# Patient Record
Sex: Female | Born: 1965
Health system: Southern US, Community
[De-identification: ages and names within clinical notes are randomized; demographics above are authoritative.]

## PROBLEM LIST (undated history)

## (undated) DIAGNOSIS — D6862 Lupus anticoagulant syndrome: Secondary | ICD-10-CM

## (undated) DIAGNOSIS — I5021 Acute systolic (congestive) heart failure: Secondary | ICD-10-CM

## (undated) DIAGNOSIS — I2510 Atherosclerotic heart disease of native coronary artery without angina pectoris: Secondary | ICD-10-CM

## (undated) DIAGNOSIS — J9610 Chronic respiratory failure, unspecified whether with hypoxia or hypercapnia: Secondary | ICD-10-CM

## (undated) DIAGNOSIS — I10 Essential (primary) hypertension: Secondary | ICD-10-CM

## (undated) DIAGNOSIS — R1011 Right upper quadrant pain: Secondary | ICD-10-CM

## (undated) DIAGNOSIS — Z930 Tracheostomy status: Secondary | ICD-10-CM

## (undated) DIAGNOSIS — E785 Hyperlipidemia, unspecified: Secondary | ICD-10-CM

## (undated) DIAGNOSIS — L409 Psoriasis, unspecified: Secondary | ICD-10-CM

## (undated) DIAGNOSIS — I2111 ST elevation (STEMI) myocardial infarction involving right coronary artery: Secondary | ICD-10-CM

## (undated) DIAGNOSIS — G8929 Other chronic pain: Secondary | ICD-10-CM

## (undated) DIAGNOSIS — Z8719 Personal history of other diseases of the digestive system: Secondary | ICD-10-CM

## (undated) DIAGNOSIS — C539 Malignant neoplasm of cervix uteri, unspecified: Secondary | ICD-10-CM

## (undated) DIAGNOSIS — E119 Type 2 diabetes mellitus without complications: Secondary | ICD-10-CM

## (undated) DIAGNOSIS — I82409 Acute embolism and thrombosis of unspecified deep veins of unspecified lower extremity: Secondary | ICD-10-CM

## (undated) DIAGNOSIS — J449 Chronic obstructive pulmonary disease, unspecified: Secondary | ICD-10-CM

## (undated) DIAGNOSIS — N809 Endometriosis, unspecified: Secondary | ICD-10-CM

## (undated) DIAGNOSIS — I469 Cardiac arrest, cause unspecified: Secondary | ICD-10-CM

## (undated) DIAGNOSIS — N170 Acute kidney failure with tubular necrosis: Secondary | ICD-10-CM

## (undated) DIAGNOSIS — I5032 Chronic diastolic (congestive) heart failure: Secondary | ICD-10-CM

## (undated) DIAGNOSIS — Z9289 Personal history of other medical treatment: Secondary | ICD-10-CM

## (undated) DIAGNOSIS — S92911A Unspecified fracture of right toe(s), initial encounter for closed fracture: Secondary | ICD-10-CM

## (undated) HISTORY — DX: Chronic diastolic (congestive) heart failure: I50.32

## (undated) HISTORY — DX: Acute embolism and thrombosis of unspecified deep veins of unspecified lower extremity: I82.409

## (undated) HISTORY — PX: CAROTID STENT: SHX1301

## (undated) HISTORY — DX: Morbid (severe) obesity due to excess calories: E66.01

## (undated) HISTORY — PX: TRACHEOSTOMY: SUR1362

## (undated) HISTORY — DX: Chronic respiratory failure, unspecified whether with hypoxia or hypercapnia: J96.10

---

## 2005-04-23 ENCOUNTER — Encounter: Payer: Self-pay | Admitting: Cardiology

## 2005-09-06 HISTORY — PX: CORONARY ARTERY BYPASS GRAFT: SHX141

## 2007-01-17 ENCOUNTER — Emergency Department (HOSPITAL_COMMUNITY): Admission: EM | Admit: 2007-01-17 | Discharge: 2007-01-17 | Payer: Self-pay | Admitting: Emergency Medicine

## 2007-01-17 ENCOUNTER — Encounter: Payer: Self-pay | Admitting: Cardiology

## 2007-02-27 ENCOUNTER — Encounter: Payer: Self-pay | Admitting: Cardiology

## 2007-02-27 ENCOUNTER — Emergency Department (HOSPITAL_COMMUNITY): Admission: EM | Admit: 2007-02-27 | Discharge: 2007-02-27 | Payer: Self-pay | Admitting: Emergency Medicine

## 2007-09-25 ENCOUNTER — Ambulatory Visit: Payer: Self-pay | Admitting: Cardiology

## 2007-09-25 ENCOUNTER — Encounter: Payer: Self-pay | Admitting: Cardiology

## 2007-10-03 ENCOUNTER — Ambulatory Visit: Payer: Self-pay | Admitting: Cardiology

## 2007-11-07 ENCOUNTER — Ambulatory Visit: Payer: Self-pay | Admitting: Cardiology

## 2009-05-12 ENCOUNTER — Encounter: Payer: Self-pay | Admitting: Physician Assistant

## 2009-05-13 ENCOUNTER — Ambulatory Visit: Payer: Self-pay | Admitting: Cardiology

## 2009-05-13 ENCOUNTER — Encounter: Payer: Self-pay | Admitting: Physician Assistant

## 2009-05-14 ENCOUNTER — Encounter: Payer: Self-pay | Admitting: Physician Assistant

## 2009-05-16 ENCOUNTER — Encounter: Payer: Self-pay | Admitting: Cardiology

## 2009-05-27 DIAGNOSIS — I2510 Atherosclerotic heart disease of native coronary artery without angina pectoris: Secondary | ICD-10-CM | POA: Insufficient documentation

## 2009-05-27 DIAGNOSIS — I10 Essential (primary) hypertension: Secondary | ICD-10-CM | POA: Insufficient documentation

## 2009-06-11 ENCOUNTER — Encounter: Payer: Self-pay | Admitting: Cardiology

## 2009-06-11 DIAGNOSIS — I82409 Acute embolism and thrombosis of unspecified deep veins of unspecified lower extremity: Secondary | ICD-10-CM | POA: Insufficient documentation

## 2009-06-11 DIAGNOSIS — Z951 Presence of aortocoronary bypass graft: Secondary | ICD-10-CM

## 2009-06-11 DIAGNOSIS — E119 Type 2 diabetes mellitus without complications: Secondary | ICD-10-CM

## 2009-06-11 DIAGNOSIS — E785 Hyperlipidemia, unspecified: Secondary | ICD-10-CM

## 2009-06-13 ENCOUNTER — Ambulatory Visit: Payer: Self-pay | Admitting: Cardiology

## 2009-06-24 ENCOUNTER — Encounter: Payer: Self-pay | Admitting: Cardiology

## 2009-10-31 ENCOUNTER — Emergency Department (HOSPITAL_COMMUNITY): Admission: EM | Admit: 2009-10-31 | Discharge: 2009-10-31 | Payer: Self-pay | Admitting: Emergency Medicine

## 2010-06-17 ENCOUNTER — Emergency Department (HOSPITAL_COMMUNITY): Admission: EM | Admit: 2010-06-17 | Discharge: 2010-06-17 | Payer: Self-pay | Admitting: Emergency Medicine

## 2011-01-19 NOTE — Assessment & Plan Note (Signed)
[place] OFFICE NOTE   Theresa [name], Theresa [name]                      MRN:          [id number]  DATE:11/07/2007                            DOB:          April 26, 1966    Theresa [name] is seen for follow-up.  I had seen her as a new patient for  consultation on September 25, 2007.  She has a very complicated medical  history.  We did proceed with an echo to reassess LV function.  Fortunately her ejection fraction appears to be in the 50-55% range.  The valves were difficult to evaluate but there were no significant  abnormalities documented.  She is feeling well.  I am seeing her back in  follow-up.  I do not have any other additional records from her prior  care at other institutions.   Her cardiac status has been stable.   PAST MEDICAL HISTORY:   ALLERGIES:  PENICILLIN.   MEDICATIONS:  Pravastatin 40, aspirin 81, Plavix 75, warfarin as  directed, metformin 500 b.i.d., fluoxetine 20, diazepam 5 at night,  furosemide 40.  She also uses either hydrochlorothiazide or metolazone  on a p.r.n. basis if she becomes volume overloaded and she is stable  with this regimen.   OTHER MEDICAL PROBLEMS:  See the list below.   REVIEW OF SYSTEMS:  She is feeling well.  She does not have any  significant complaints.  She does not check her blood pressure at home.  Her review of systems is negative.   PHYSICAL EXAM:  Weight is 254 pounds.  This is down 2 pounds by our  scale since her last visit.  Blood pressure is 151/97.  The patient is oriented to person, time and place.  Affect is normal.  She is significantly overweight.  HEENT:  Reveals no xanthelasma.  She has normal extraocular motion.  There are no carotid bruits.  There is no jugular venous distention.  She has her long-term permanent trache in place.  She speaks well when  her trache site is covered.  Lungs are clear.  Respiratory effort is not labored.  Cardiac exam  reveals S1-S2.  There are no clicks or significant murmurs.  The abdomen reveals normal bowel sounds.  She has no significant peripheral edema.   No other labs were done today.  See the echo report as mentioned above.   PROBLEMS:  1. History of elevated cholesterol treated.  2. His history of some type of aneurysm of her right groin treated by      stenting in the past by history.  3. History of tubal ligation.  4. Next history of cervical cancer treated with laser.  5. Status post C-section.  6. Next status post respiratory failure in 2002 of unknown etiology      with lung biopsies and a permanent tracheostomy.  7. Allergy to PENICILLIN.  8. Next history of lupus anticoagulant and DVT.  She is on Coumadin      for this.  This is why she is on aspirin, Plavix and Coumadin.  At      some point  we may back off on some of these medicines, but until we      have more information I am hesitant to do that at this time.  9. Next coronary disease.  She had stents and CABG, and CABG x2 in      2007.  We have no records yet.  10.Next hypertension.  Her blood pressure is higher than I would like.      Specifically with diabetes this must be treated.  An ACE inhibitor      would be optimal.  She tells me that she has used lisinopril in the      past.  Low-dose lisinopril 10 mg will be started today and will see      her in follow-up.  11.Next Coumadin therapy along with aspirin and Plavix.  See the      discussion above.  These are to be continued at this time.  12.Next diabetes.  13.Next ejection fraction 50-55% by echo on October 03, 2007.   Lisinopril 10 mg will be started.  I will see her back in approximately  3 months.     Carlena Bjornstad, MD, [facility]  Electronically Signed    JDK/MedQ  DD: 11/07/2007  DT: 11/07/2007  Job #: [id number]   cc:   Monico Blitz, MD

## 2011-01-19 NOTE — Assessment & Plan Note (Signed)
[place] OFFICE NOTE   KANDA, ELRICK                      MRN:          [id number]  DATE:09/25/2007                            DOB:          10-30-1965    Ms. Theresa [name] has significant cardiac disease.  She has a significant  history and is stable at this time.  She is seen for new evaluation to  reassess all of her cardiac problems.  Historically, she has had  definite coronary disease with interventions and surgery.  Her angina is  a heaviness in her chest.  She is not having that at this time.  She is  not having any syncope or presyncope.   PAST MEDICAL HISTORY:  Quite complicated.  In 2002 she had respiratory  failure of unknown etiology and she was comatose for several weeks.  She  had bilateral lung biopsies by history and was told she had an unknown  etiology.  She required a tracheostomy.  When they tried to remove it,  she was not able to breathe, and she has a chronic tracheostomy.  Then  in approximately 2007, she had multiple stents done in [place]  followed by surgery in [place] with CABG in 2007.  She has also had a  stent placed in her right leg, possibly in the femoral area.  There is a  history of deep venous thrombosis.  She has lupus anticoagulant  abnormality and she is on chronic Coumadin.  Despite all of these  issues, she is stable.   ALLERGIES:  PENICILLIN.   MEDICATIONS:  1. Pravastatin 40.  2. Aspirin 81.  3. Plavix 75.  4. Coumadin followed by Dr. Manuella Ghazi.  5. Metformin.  6. Fluoxetine.  7. Diazepam.  8. Furosemide 40.  9. Hydrochlorothiazide 25.   OTHER MEDICAL PROBLEMS:  See the list below.   SOCIAL HISTORY:  She is married and here with her husband today.   FAMILY HISTORY:  Family history is noncontributory.   The patient unfortunately continues to smoke one half-pack per day.  She  hopes to begin to try to stop again.  She has stopped in the past.   REVIEW OF SYSTEMS:  At this point she has no major complaints.  She does  have some pain in her calf muscles intermittently.  She has some  shortness of breath.  She has mild intermittent swelling in her ankles.  She does have a some headaches.  Otherwise her review of systems is  negative.   PHYSICAL EXAM:  Blood pressure today is 140/90 with a pulse of 83.  Weight is 257 pounds and height 4 feet 10 inches.  The patient is oriented to person, time and place.  Affect is normal.  She is here with her husband today.  HEENT:  Reveals no xanthelasma.  She has normal extraocular motion.  There are no carotid bruits.  There is no jugular venous distention.  Her long-term permanent trache is in place.  She speaks well when  covering her trache.  Lungs are clear.  Respiratory effort is not labored.  Cardiac exam  reveals S1-S2.  There are no clicks or significant murmurs.  Abdomen is obese but soft.  She has trace peripheral edema.  She is markedly overweight.   EKG reveals nonspecific ST-T wave changes.  She has a normal sinus  rhythm.  No labs are available at this time.   PROBLEMS:  1. History of elevated cholesterol treated.  2. History of some type of aneurysm in her right groin treated by      stenting by history.  3. History of tubal ligation.  4. History of cervical cancer treated with laser.  5. Status post C-section.  6. Status post respiratory failure in 2002 of unknown etiology with      lung biopsies and a permanent tracheostomy.  7. Allergy to PENICILLIN.  8. History of lupus anticoagulant and DVT.  She is on Coumadin for      this.  9. Coronary artery disease.  The patient has had stents and CABG.  Her      CABG was x2 in 2007 and we do not have the specific records and      hopefully we can obtain some of this data over time.  10.Hypertension on medications.  11.Coumadin therapy along with Plavix and aspirin.  I am not inclined      to stop any of these at this time.    The patient is stable today.  We do not know her LV function.  She needs  a 2-D echo.  This will be done and I will see her back in 4-5 weeks.  No  changes in her medicines at this time.     Carlena Bjornstad, MD, [facility]  Electronically Signed    JDK/MedQ  DD: 09/25/2007  DT: 09/25/2007  Job #: [id number]   cc:   Monico Blitz, MD

## 2011-01-31 DIAGNOSIS — R079 Chest pain, unspecified: Secondary | ICD-10-CM

## 2011-06-23 LAB — DIFFERENTIAL
Eosinophils Absolute: 0.1
Lymphocytes Relative: 34
Lymphs Abs: 2.4
Neutrophils Relative %: 61

## 2011-06-23 LAB — PROTIME-INR
INR: 1.7 — ABNORMAL HIGH
Prothrombin Time: 21.1 — ABNORMAL HIGH

## 2011-06-23 LAB — COMPREHENSIVE METABOLIC PANEL
Alkaline Phosphatase: 121 — ABNORMAL HIGH
BUN: 7
Chloride: 107
GFR calc non Af Amer: >60
Glucose, Bld: 126 — ABNORMAL HIGH
Potassium: 3.9
Total Bilirubin: 0.8

## 2011-06-23 LAB — CBC
Platelets: 297
WBC: 7.2

## 2011-06-23 LAB — LIPASE, BLOOD: Lipase: 22

## 2011-08-15 ENCOUNTER — Emergency Department (HOSPITAL_COMMUNITY): Payer: Medicare Other

## 2011-08-15 ENCOUNTER — Emergency Department (HOSPITAL_COMMUNITY)
Admission: EM | Admit: 2011-08-15 | Discharge: 2011-08-15 | Disposition: A | Discharge status: Home / Self Care | Payer: Medicare Other | Attending: Emergency Medicine | Admitting: Emergency Medicine

## 2011-08-15 DIAGNOSIS — F17200 Nicotine dependence, unspecified, uncomplicated: Secondary | ICD-10-CM | POA: Insufficient documentation

## 2011-08-15 DIAGNOSIS — Y92410 Unspecified street and highway as the place of occurrence of the external cause: Secondary | ICD-10-CM | POA: Insufficient documentation

## 2011-08-15 DIAGNOSIS — M25579 Pain in unspecified ankle and joints of unspecified foot: Secondary | ICD-10-CM | POA: Insufficient documentation

## 2011-08-15 DIAGNOSIS — D6859 Other primary thrombophilia: Secondary | ICD-10-CM | POA: Insufficient documentation

## 2011-08-15 DIAGNOSIS — IMO0002 Reserved for concepts with insufficient information to code with codable children: Secondary | ICD-10-CM | POA: Insufficient documentation

## 2011-08-15 DIAGNOSIS — S93409A Sprain of unspecified ligament of unspecified ankle, initial encounter: Secondary | ICD-10-CM | POA: Insufficient documentation

## 2011-08-15 DIAGNOSIS — I252 Old myocardial infarction: Secondary | ICD-10-CM | POA: Insufficient documentation

## 2011-08-15 HISTORY — DX: Lupus anticoagulant syndrome: D68.62

## 2011-08-15 MED ORDER — OXYCODONE-ACETAMINOPHEN 5-325 MG PO TABS
2.0000 | ORAL_TABLET | Freq: Once | ORAL | Status: AC
  Administered 2011-08-15: 2 via ORAL
  Filled 2011-08-15: qty 2

## 2011-08-15 MED ORDER — OXYCODONE-ACETAMINOPHEN 5-325 MG PO TABS: 1.0000 | ORAL_TABLET | ORAL | Status: AC | PRN
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
## 2011-08-16 NOTE — ED Provider Notes (Signed)
Medical screening examination/treatment/procedure(s) were performed by non-physician practitioner and as supervising physician I was immediately available for consultation/collaboration.   Alfonzo Feller, DO 08/16/11 [phone]

## 2015-05-20 ENCOUNTER — Emergency Department (HOSPITAL_COMMUNITY): Payer: Medicare Other

## 2015-05-20 ENCOUNTER — Emergency Department (HOSPITAL_COMMUNITY)
Admission: EM | Admit: 2015-05-20 | Discharge: 2015-05-20 | Disposition: A | Discharge status: Home / Self Care | Payer: Medicare Other | Attending: Emergency Medicine | Admitting: Emergency Medicine

## 2015-05-20 ENCOUNTER — Encounter (HOSPITAL_COMMUNITY): Payer: Self-pay

## 2015-05-20 DIAGNOSIS — Z8709 Personal history of other diseases of the respiratory system: Secondary | ICD-10-CM | POA: Insufficient documentation

## 2015-05-20 DIAGNOSIS — Y9389 Activity, other specified: Secondary | ICD-10-CM | POA: Insufficient documentation

## 2015-05-20 DIAGNOSIS — S8991XA Unspecified injury of right lower leg, initial encounter: Secondary | ICD-10-CM | POA: Insufficient documentation

## 2015-05-20 DIAGNOSIS — Y92410 Unspecified street and highway as the place of occurrence of the external cause: Secondary | ICD-10-CM | POA: Insufficient documentation

## 2015-05-20 DIAGNOSIS — Z79899 Other long term (current) drug therapy: Secondary | ICD-10-CM | POA: Insufficient documentation

## 2015-05-20 DIAGNOSIS — Y998 Other external cause status: Secondary | ICD-10-CM | POA: Insufficient documentation

## 2015-05-20 DIAGNOSIS — Z880 Allergy status to penicillin: Secondary | ICD-10-CM | POA: Insufficient documentation

## 2015-05-20 DIAGNOSIS — T1490XA Injury, unspecified, initial encounter: Secondary | ICD-10-CM

## 2015-05-20 DIAGNOSIS — Z862 Personal history of diseases of the blood and blood-forming organs and certain disorders involving the immune mechanism: Secondary | ICD-10-CM | POA: Insufficient documentation

## 2015-05-20 DIAGNOSIS — S42342A Displaced spiral fracture of shaft of humerus, left arm, initial encounter for closed fracture: Secondary | ICD-10-CM | POA: Insufficient documentation

## 2015-05-20 DIAGNOSIS — S42302A Unspecified fracture of shaft of humerus, left arm, initial encounter for closed fracture: Secondary | ICD-10-CM

## 2015-05-20 DIAGNOSIS — Z930 Tracheostomy status: Secondary | ICD-10-CM | POA: Insufficient documentation

## 2015-05-20 DIAGNOSIS — Z720 Tobacco use: Secondary | ICD-10-CM | POA: Insufficient documentation

## 2015-05-20 DIAGNOSIS — S0003XA Contusion of scalp, initial encounter: Secondary | ICD-10-CM | POA: Insufficient documentation

## 2015-05-20 DIAGNOSIS — Z7901 Long term (current) use of anticoagulants: Secondary | ICD-10-CM | POA: Insufficient documentation

## 2015-05-20 DIAGNOSIS — I252 Old myocardial infarction: Secondary | ICD-10-CM | POA: Insufficient documentation

## 2015-05-20 LAB — COMPREHENSIVE METABOLIC PANEL
ALT: 18 U/L (ref 14–54)
AST: 21 U/L (ref 15–41)
Albumin: 3.5 g/dL (ref 3.5–5.0)
Alkaline Phosphatase: 93 U/L (ref 38–126)
Anion gap: 13 (ref 5–15)
BUN: 9 mg/dL (ref 6–20)
CHLORIDE: 105 mmol/L (ref 101–111)
CO2: 19 mmol/L — ABNORMAL LOW (ref 22–32)
Calcium: 8.9 mg/dL (ref 8.9–10.3)
Creatinine, Ser: 0.78 mg/dL (ref 0.44–1.00)
Glucose, Bld: 160 mg/dL — ABNORMAL HIGH (ref 65–99)
POTASSIUM: 4.2 mmol/L (ref 3.5–5.1)
Sodium: 137 mmol/L (ref 135–145)
Total Bilirubin: 1.2 mg/dL (ref 0.3–1.2)
Total Protein: 7 g/dL (ref 6.5–8.1)

## 2015-05-20 LAB — PROTIME-INR
INR: 2.23 — AB (ref 0.00–1.49)
Prothrombin Time: 24.5 seconds — ABNORMAL HIGH (ref 11.6–15.2)

## 2015-05-20 LAB — CBC
HEMATOCRIT: 49.7 % — AB (ref 36.0–46.0)
HEMOGLOBIN: 17.4 g/dL — AB (ref 12.0–15.0)
MCH: 33.3 pg (ref 26.0–34.0)
MCHC: 35 g/dL (ref 30.0–36.0)
MCV: 95 fL (ref 78.0–100.0)
Platelets: 209 10*3/uL (ref 150–400)
RBC: 5.23 MIL/uL — AB (ref 3.87–5.11)
RDW: 13.2 % (ref 11.5–15.5)
WBC: 11.9 10*3/uL — AB (ref 4.0–10.5)

## 2015-05-20 LAB — ETHANOL: Alcohol, Ethyl (B): <5 mg/dL (ref <5)

## 2015-05-20 LAB — SAMPLE TO BLOOD BANK

## 2015-05-20 LAB — CDS SEROLOGY

## 2015-05-20 MED ORDER — OXYCODONE HCL 5 MG PO TABS: 5.0000 mg | ORAL_TABLET | Freq: Four times a day (QID) | ORAL | Status: DC | PRN

## 2015-05-20 MED ORDER — HYDROMORPHONE HCL 1 MG/ML IJ SOLN
1.0000 mg | Freq: Once | INTRAMUSCULAR | Status: AC
  Administered 2015-05-20: 1 mg via INTRAVENOUS
  Filled 2015-05-20: qty 1

## 2015-05-20 MED ORDER — ONDANSETRON 4 MG PO TBDP
8.0000 mg | ORAL_TABLET | Freq: Once | ORAL | Status: AC
  Administered 2015-05-20: 8 mg via ORAL
  Filled 2015-05-20: qty 2

## 2015-05-20 MED ORDER — ONDANSETRON HCL 4 MG/2ML IJ SOLN
4.0000 mg | Freq: Once | INTRAMUSCULAR | Status: AC
  Administered 2015-05-20: 4 mg via INTRAVENOUS
  Filled 2015-05-20: qty 2

## 2015-05-20 MED ORDER — ONDANSETRON 4 MG PO TBDP: 4.0000 mg | ORAL_TABLET | Freq: Three times a day (TID) | ORAL | Status: DC | PRN

## 2015-05-20 NOTE — ED Notes (Signed)
Paged ortho tech of splint needed.  Patient remains in radiology at this time.
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
## 2015-05-20 NOTE — ED Notes (Signed)
Patient to CT and xray.
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
## 2015-05-20 NOTE — ED Notes (Signed)
Patient returned from radiology
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
## 2015-05-20 NOTE — Progress Notes (Signed)
Orthopedic Tech Progress Note Patient Details:  Theresa [name] 27-Nov-1965 [id number]  Ortho Devices Type of Ortho Device: Ace wrap, Coapt Ortho Device/Splint Location: LUE Ortho Device/Splint Interventions: Ordered, Application   Braulio Bosch 05/20/2015, 9:36 PM

## 2015-06-16 ENCOUNTER — Encounter: Payer: Self-pay | Admitting: Physician Assistant

## 2015-10-06 DIAGNOSIS — I80209 Phlebitis and thrombophlebitis of unspecified deep vessels of unspecified lower extremity: Secondary | ICD-10-CM | POA: Diagnosis not present

## 2015-10-07 DIAGNOSIS — S42342D Displaced spiral fracture of shaft of humerus, left arm, subsequent encounter for fracture with routine healing: Secondary | ICD-10-CM | POA: Diagnosis not present

## 2015-10-15 DIAGNOSIS — R05 Cough: Secondary | ICD-10-CM | POA: Diagnosis not present

## 2015-10-15 DIAGNOSIS — F17210 Nicotine dependence, cigarettes, uncomplicated: Secondary | ICD-10-CM | POA: Diagnosis not present

## 2015-10-15 DIAGNOSIS — J449 Chronic obstructive pulmonary disease, unspecified: Secondary | ICD-10-CM | POA: Diagnosis not present

## 2015-10-15 DIAGNOSIS — E1142 Type 2 diabetes mellitus with diabetic polyneuropathy: Secondary | ICD-10-CM | POA: Diagnosis not present

## 2015-10-15 DIAGNOSIS — Z720 Tobacco use: Secondary | ICD-10-CM | POA: Diagnosis not present

## 2015-10-15 DIAGNOSIS — Z930 Tracheostomy status: Secondary | ICD-10-CM | POA: Diagnosis not present

## 2015-10-15 DIAGNOSIS — I517 Cardiomegaly: Secondary | ICD-10-CM | POA: Diagnosis not present

## 2015-10-15 DIAGNOSIS — Z6841 Body Mass Index (BMI) 40.0 and over, adult: Secondary | ICD-10-CM | POA: Diagnosis not present

## 2015-10-29 ENCOUNTER — Encounter (HOSPITAL_COMMUNITY)
Admission: EM | Disposition: A | Discharge status: Home Health | Payer: Self-pay | Source: Home / Self Care | Attending: Cardiovascular Disease

## 2015-10-29 ENCOUNTER — Inpatient Hospital Stay (HOSPITAL_COMMUNITY)
Admission: EM | Admit: 2015-10-29 | Discharge: 2015-11-03 | DRG: 246 | Disposition: A | Discharge status: Home Health | Payer: Medicare Other | Attending: Cardiovascular Disease | Admitting: Cardiovascular Disease

## 2015-10-29 ENCOUNTER — Encounter (HOSPITAL_COMMUNITY): Payer: Self-pay | Admitting: Cardiovascular Disease

## 2015-10-29 DIAGNOSIS — I213 ST elevation (STEMI) myocardial infarction of unspecified site: Secondary | ICD-10-CM | POA: Diagnosis not present

## 2015-10-29 DIAGNOSIS — R0682 Tachypnea, not elsewhere classified: Secondary | ICD-10-CM | POA: Diagnosis not present

## 2015-10-29 DIAGNOSIS — Z955 Presence of coronary angioplasty implant and graft: Secondary | ICD-10-CM

## 2015-10-29 DIAGNOSIS — Z6841 Body Mass Index (BMI) 40.0 and over, adult: Secondary | ICD-10-CM

## 2015-10-29 DIAGNOSIS — R0989 Other specified symptoms and signs involving the circulatory and respiratory systems: Secondary | ICD-10-CM | POA: Diagnosis not present

## 2015-10-29 DIAGNOSIS — F17210 Nicotine dependence, cigarettes, uncomplicated: Secondary | ICD-10-CM | POA: Diagnosis present

## 2015-10-29 DIAGNOSIS — I5032 Chronic diastolic (congestive) heart failure: Secondary | ICD-10-CM | POA: Diagnosis present

## 2015-10-29 DIAGNOSIS — Z8541 Personal history of malignant neoplasm of cervix uteri: Secondary | ICD-10-CM

## 2015-10-29 DIAGNOSIS — I2582 Chronic total occlusion of coronary artery: Secondary | ICD-10-CM | POA: Diagnosis present

## 2015-10-29 DIAGNOSIS — R0602 Shortness of breath: Secondary | ICD-10-CM | POA: Diagnosis not present

## 2015-10-29 DIAGNOSIS — Z79899 Other long term (current) drug therapy: Secondary | ICD-10-CM | POA: Diagnosis not present

## 2015-10-29 DIAGNOSIS — Z951 Presence of aortocoronary bypass graft: Secondary | ICD-10-CM | POA: Diagnosis not present

## 2015-10-29 DIAGNOSIS — D751 Secondary polycythemia: Secondary | ICD-10-CM | POA: Diagnosis present

## 2015-10-29 DIAGNOSIS — Z930 Tracheostomy status: Secondary | ICD-10-CM

## 2015-10-29 DIAGNOSIS — E785 Hyperlipidemia, unspecified: Secondary | ICD-10-CM | POA: Diagnosis present

## 2015-10-29 DIAGNOSIS — I2119 ST elevation (STEMI) myocardial infarction involving other coronary artery of inferior wall: Secondary | ICD-10-CM | POA: Diagnosis not present

## 2015-10-29 DIAGNOSIS — E119 Type 2 diabetes mellitus without complications: Secondary | ICD-10-CM | POA: Diagnosis not present

## 2015-10-29 DIAGNOSIS — Z7984 Long term (current) use of oral hypoglycemic drugs: Secondary | ICD-10-CM

## 2015-10-29 DIAGNOSIS — Z8249 Family history of ischemic heart disease and other diseases of the circulatory system: Secondary | ICD-10-CM

## 2015-10-29 DIAGNOSIS — J9621 Acute and chronic respiratory failure with hypoxia: Secondary | ICD-10-CM | POA: Diagnosis not present

## 2015-10-29 DIAGNOSIS — Z720 Tobacco use: Secondary | ICD-10-CM | POA: Diagnosis not present

## 2015-10-29 DIAGNOSIS — I2510 Atherosclerotic heart disease of native coronary artery without angina pectoris: Secondary | ICD-10-CM | POA: Diagnosis present

## 2015-10-29 DIAGNOSIS — E1140 Type 2 diabetes mellitus with diabetic neuropathy, unspecified: Secondary | ICD-10-CM | POA: Diagnosis present

## 2015-10-29 DIAGNOSIS — E876 Hypokalemia: Secondary | ICD-10-CM

## 2015-10-29 DIAGNOSIS — I110 Hypertensive heart disease with heart failure: Secondary | ICD-10-CM | POA: Diagnosis not present

## 2015-10-29 DIAGNOSIS — I25810 Atherosclerosis of coronary artery bypass graft(s) without angina pectoris: Secondary | ICD-10-CM | POA: Diagnosis not present

## 2015-10-29 DIAGNOSIS — D6862 Lupus anticoagulant syndrome: Secondary | ICD-10-CM | POA: Diagnosis present

## 2015-10-29 DIAGNOSIS — J9601 Acute respiratory failure with hypoxia: Secondary | ICD-10-CM | POA: Diagnosis not present

## 2015-10-29 DIAGNOSIS — I2111 ST elevation (STEMI) myocardial infarction involving right coronary artery: Secondary | ICD-10-CM | POA: Diagnosis present

## 2015-10-29 DIAGNOSIS — J441 Chronic obstructive pulmonary disease with (acute) exacerbation: Secondary | ICD-10-CM | POA: Diagnosis not present

## 2015-10-29 DIAGNOSIS — Z7901 Long term (current) use of anticoagulants: Secondary | ICD-10-CM | POA: Diagnosis not present

## 2015-10-29 DIAGNOSIS — B9689 Other specified bacterial agents as the cause of diseases classified elsewhere: Secondary | ICD-10-CM | POA: Diagnosis present

## 2015-10-29 DIAGNOSIS — I25700 Atherosclerosis of coronary artery bypass graft(s), unspecified, with unstable angina pectoris: Secondary | ICD-10-CM | POA: Diagnosis not present

## 2015-10-29 DIAGNOSIS — G4733 Obstructive sleep apnea (adult) (pediatric): Secondary | ICD-10-CM | POA: Diagnosis not present

## 2015-10-29 DIAGNOSIS — I425 Other restrictive cardiomyopathy: Secondary | ICD-10-CM | POA: Diagnosis present

## 2015-10-29 DIAGNOSIS — I272 Other secondary pulmonary hypertension: Secondary | ICD-10-CM | POA: Diagnosis present

## 2015-10-29 DIAGNOSIS — T82858A Stenosis of vascular prosthetic devices, implants and grafts, initial encounter: Secondary | ICD-10-CM | POA: Diagnosis present

## 2015-10-29 DIAGNOSIS — I517 Cardiomegaly: Secondary | ICD-10-CM | POA: Diagnosis not present

## 2015-10-29 DIAGNOSIS — I252 Old myocardial infarction: Secondary | ICD-10-CM | POA: Diagnosis not present

## 2015-10-29 DIAGNOSIS — I10 Essential (primary) hypertension: Secondary | ICD-10-CM | POA: Diagnosis not present

## 2015-10-29 DIAGNOSIS — Z86718 Personal history of other venous thrombosis and embolism: Secondary | ICD-10-CM | POA: Diagnosis not present

## 2015-10-29 DIAGNOSIS — Y832 Surgical operation with anastomosis, bypass or graft as the cause of abnormal reaction of the patient, or of later complication, without mention of misadventure at the time of the procedure: Secondary | ICD-10-CM | POA: Diagnosis present

## 2015-10-29 DIAGNOSIS — I5033 Acute on chronic diastolic (congestive) heart failure: Secondary | ICD-10-CM | POA: Diagnosis not present

## 2015-10-29 DIAGNOSIS — Z880 Allergy status to penicillin: Secondary | ICD-10-CM | POA: Diagnosis not present

## 2015-10-29 DIAGNOSIS — R791 Abnormal coagulation profile: Secondary | ICD-10-CM | POA: Diagnosis not present

## 2015-10-29 DIAGNOSIS — I509 Heart failure, unspecified: Secondary | ICD-10-CM | POA: Diagnosis not present

## 2015-10-29 DIAGNOSIS — J811 Chronic pulmonary edema: Secondary | ICD-10-CM

## 2015-10-29 DIAGNOSIS — J9690 Respiratory failure, unspecified, unspecified whether with hypoxia or hypercapnia: Secondary | ICD-10-CM

## 2015-10-29 DIAGNOSIS — R079 Chest pain, unspecified: Secondary | ICD-10-CM | POA: Diagnosis not present

## 2015-10-29 DIAGNOSIS — E669 Obesity, unspecified: Secondary | ICD-10-CM | POA: Diagnosis not present

## 2015-10-29 HISTORY — PX: CARDIAC CATHETERIZATION: SHX172

## 2015-10-29 HISTORY — DX: Type 2 diabetes mellitus without complications: E11.9

## 2015-10-29 HISTORY — DX: Essential (primary) hypertension: I10

## 2015-10-29 HISTORY — DX: Tracheostomy status: Z93.0

## 2015-10-29 HISTORY — DX: Hyperlipidemia, unspecified: E78.5

## 2015-10-29 HISTORY — DX: ST elevation (STEMI) myocardial infarction involving right coronary artery: I21.11

## 2015-10-29 HISTORY — DX: Malignant neoplasm of cervix uteri, unspecified: C53.9

## 2015-10-29 HISTORY — DX: Atherosclerotic heart disease of native coronary artery without angina pectoris: I25.10

## 2015-10-29 LAB — POCT I-STAT 3, ART BLOOD GAS (G3+)
Bicarbonate: 28.5 mEq/L — ABNORMAL HIGH (ref 20.0–24.0)
O2 Saturation: 75 %
PCO2 ART: 60 mmHg — AB (ref 35.0–45.0)
PH ART: 7.285 — AB (ref 7.350–7.450)
PO2 ART: 46 mmHg — AB (ref 80.0–100.0)
TCO2: 30 mmol/L (ref 0–100)

## 2015-10-29 LAB — CBC
HCT: 54.5 % — ABNORMAL HIGH (ref 36.0–46.0)
Hemoglobin: 18 g/dL — ABNORMAL HIGH (ref 12.0–15.0)
MCH: 32 pg (ref 26.0–34.0)
MCHC: 33 g/dL (ref 30.0–36.0)
MCV: 97 fL (ref 78.0–100.0)
PLATELETS: 190 10*3/uL (ref 150–400)
RBC: 5.62 MIL/uL — AB (ref 3.87–5.11)
RDW: 14.2 % (ref 11.5–15.5)
WBC: 8.8 10*3/uL (ref 4.0–10.5)

## 2015-10-29 SURGERY — LEFT HEART CATH AND CORS/GRAFTS ANGIOGRAPHY
Anesthesia: LOCAL

## 2015-10-29 MED ORDER — BIVALIRUDIN BOLUS VIA INFUSION - CUPID
INTRAVENOUS | Status: DC | PRN
  Administered 2015-10-29: 85.05 mg via INTRAVENOUS

## 2015-10-29 MED ORDER — LIDOCAINE HCL (PF) 1 % IJ SOLN
INTRAMUSCULAR | Status: DC | PRN
Start: 2015-10-29 — End: 2015-10-29
  Administered 2015-10-29: 15 mL

## 2015-10-29 MED ORDER — INSULIN ASPART 100 UNIT/ML ~~LOC~~ SOLN
0.0000 [IU] | Freq: Three times a day (TID) | SUBCUTANEOUS | Status: DC
  Administered 2015-10-30 (×3): 5 [IU] via SUBCUTANEOUS
  Administered 2015-10-31 (×2): 8 [IU] via SUBCUTANEOUS
  Administered 2015-10-31: 3 [IU] via SUBCUTANEOUS
  Administered 2015-11-01: 2 [IU] via SUBCUTANEOUS
  Administered 2015-11-01: 8 [IU] via SUBCUTANEOUS
  Administered 2015-11-01: 3 [IU] via SUBCUTANEOUS
  Administered 2015-11-02: 5 [IU] via SUBCUTANEOUS
  Administered 2015-11-02: 8 [IU] via SUBCUTANEOUS
  Administered 2015-11-02: 5 [IU] via SUBCUTANEOUS
  Administered 2015-11-03: 3 [IU] via SUBCUTANEOUS
  Administered 2015-11-03: 5 [IU] via SUBCUTANEOUS

## 2015-10-29 MED ORDER — SODIUM CHLORIDE 0.9 % IV SOLN
INTRAVENOUS | Status: AC
  Administered 2015-10-29: 22:00:00 via INTRAVENOUS

## 2015-10-29 MED ORDER — TICAGRELOR 90 MG PO TABS
ORAL_TABLET | ORAL | Status: AC
  Filled 2015-10-29: qty 2

## 2015-10-29 MED ORDER — NITROGLYCERIN 1 MG/10 ML FOR IR/CATH LAB
INTRA_ARTERIAL | Status: AC
  Filled 2015-10-29: qty 10

## 2015-10-29 MED ORDER — SODIUM CHLORIDE 0.9% FLUSH
3.0000 mL | Freq: Two times a day (BID) | INTRAVENOUS | Status: DC
  Administered 2015-10-30 – 2015-11-03 (×8): 3 mL via INTRAVENOUS

## 2015-10-29 MED ORDER — SODIUM CHLORIDE 0.9 % IV SOLN
INTRAVENOUS | Status: DC | PRN
  Administered 2015-10-29 (×2): 10 mL/h via INTRAVENOUS

## 2015-10-29 MED ORDER — TICAGRELOR 90 MG PO TABS
90.0000 mg | ORAL_TABLET | Freq: Two times a day (BID) | ORAL | Status: DC
  Administered 2015-10-30 – 2015-11-01 (×5): 90 mg via ORAL
  Filled 2015-10-29 (×5): qty 1

## 2015-10-29 MED ORDER — SODIUM CHLORIDE 0.9% FLUSH: 3.0000 mL | INTRAVENOUS | Status: DC | PRN

## 2015-10-29 MED ORDER — MIDAZOLAM HCL 2 MG/2ML IJ SOLN
INTRAMUSCULAR | Status: DC | PRN
  Administered 2015-10-29: 1 mg via INTRAVENOUS

## 2015-10-29 MED ORDER — ATORVASTATIN CALCIUM 80 MG PO TABS
80.0000 mg | ORAL_TABLET | Freq: Every day | ORAL | Status: DC
  Administered 2015-10-29 – 2015-11-02 (×5): 80 mg via ORAL
  Filled 2015-10-29 (×5): qty 1

## 2015-10-29 MED ORDER — FUROSEMIDE 10 MG/ML IJ SOLN
40.0000 mg | Freq: Two times a day (BID) | INTRAMUSCULAR | Status: DC
  Administered 2015-10-29 – 2015-11-02 (×8): 40 mg via INTRAVENOUS
  Filled 2015-10-29 (×10): qty 4

## 2015-10-29 MED ORDER — ONDANSETRON HCL 4 MG/2ML IJ SOLN: 4.0000 mg | Freq: Four times a day (QID) | INTRAMUSCULAR | Status: DC | PRN

## 2015-10-29 MED ORDER — INSULIN ASPART 100 UNIT/ML ~~LOC~~ SOLN
10.0000 [IU] | Freq: Once | SUBCUTANEOUS | Status: AC
  Administered 2015-10-30: 10 [IU] via SUBCUTANEOUS

## 2015-10-29 MED ORDER — ADENOSINE 6 MG/2ML IV SOLN
INTRAVENOUS | Status: AC
  Filled 2015-10-29: qty 2

## 2015-10-29 MED ORDER — SODIUM CHLORIDE 0.9 % IV SOLN
250.0000 mg | INTRAVENOUS | Status: DC | PRN
  Administered 2015-10-29: 1.75 mg/kg/h via INTRAVENOUS

## 2015-10-29 MED ORDER — LISINOPRIL 20 MG PO TABS
20.0000 mg | ORAL_TABLET | Freq: Every day | ORAL | Status: DC
  Administered 2015-10-30 – 2015-11-03 (×5): 20 mg via ORAL
  Filled 2015-10-29 (×5): qty 1

## 2015-10-29 MED ORDER — HEPARIN (PORCINE) IN NACL 2-0.9 UNIT/ML-% IJ SOLN
INTRAMUSCULAR | Status: AC
  Filled 2015-10-29: qty 1000

## 2015-10-29 MED ORDER — ADENOSINE (DIAGNOSTIC) FOR INTRACORONARY USE
INTRAVENOUS | Status: DC | PRN
  Administered 2015-10-29 (×3): 36 ug via INTRACORONARY

## 2015-10-29 MED ORDER — BIVALIRUDIN 250 MG IV SOLR
INTRAVENOUS | Status: AC
  Filled 2015-10-29: qty 250

## 2015-10-29 MED ORDER — FENTANYL CITRATE (PF) 100 MCG/2ML IJ SOLN
INTRAMUSCULAR | Status: AC
  Filled 2015-10-29: qty 2

## 2015-10-29 MED ORDER — FENTANYL CITRATE (PF) 100 MCG/2ML IJ SOLN
INTRAMUSCULAR | Status: DC | PRN
  Administered 2015-10-29 (×2): 25 ug via INTRAVENOUS

## 2015-10-29 MED ORDER — MORPHINE SULFATE (PF) 2 MG/ML IV SOLN: 2.0000 mg | INTRAVENOUS | Status: DC | PRN

## 2015-10-29 MED ORDER — GABAPENTIN 100 MG PO CAPS
100.0000 mg | ORAL_CAPSULE | Freq: Every day | ORAL | Status: DC
Start: 2015-10-29 — End: 2015-11-03
  Administered 2015-10-29 – 2015-11-02 (×5): 100 mg via ORAL
  Filled 2015-10-29 (×5): qty 1

## 2015-10-29 MED ORDER — OXYCODONE-ACETAMINOPHEN 5-325 MG PO TABS: 1.0000 | ORAL_TABLET | ORAL | Status: DC | PRN

## 2015-10-29 MED ORDER — SODIUM CHLORIDE 0.9 % IV SOLN: 250.0000 mL | INTRAVENOUS | Status: DC | PRN

## 2015-10-29 MED ORDER — METOPROLOL TARTRATE 25 MG PO TABS
25.0000 mg | ORAL_TABLET | Freq: Two times a day (BID) | ORAL | Status: DC
  Administered 2015-10-29 – 2015-10-30 (×2): 25 mg via ORAL
  Filled 2015-10-29 (×2): qty 1

## 2015-10-29 MED ORDER — ACETAMINOPHEN 325 MG PO TABS: 650.0000 mg | ORAL_TABLET | ORAL | Status: DC | PRN

## 2015-10-29 MED ORDER — LIDOCAINE HCL (PF) 1 % IJ SOLN
INTRAMUSCULAR | Status: AC
  Filled 2015-10-29: qty 30

## 2015-10-29 MED ORDER — ASPIRIN 81 MG PO CHEW
81.0000 mg | CHEWABLE_TABLET | Freq: Every day | ORAL | Status: DC
  Administered 2015-10-30 – 2015-11-03 (×5): 81 mg via ORAL
  Filled 2015-10-29 (×5): qty 1

## 2015-10-29 MED ORDER — TICAGRELOR 90 MG PO TABS
ORAL_TABLET | ORAL | Status: DC | PRN
  Administered 2015-10-29: 180 mg via ORAL

## 2015-10-29 MED ORDER — MIDAZOLAM HCL 2 MG/2ML IJ SOLN
INTRAMUSCULAR | Status: AC
  Filled 2015-10-29: qty 2

## 2015-10-29 MED ORDER — NITROGLYCERIN 1 MG/10 ML FOR IR/CATH LAB
INTRA_ARTERIAL | Status: DC | PRN
  Administered 2015-10-29 (×2): 200 ug

## 2015-10-29 SURGICAL SUPPLY — 18 items
BALLN EUPHORA RX 2.0X12 (BALLOONS) ×3
BALLN ~~LOC~~ EUPHORA RX 2.75X12 (BALLOONS) ×3
BALLOON EUPHORA RX 2.0X12 (BALLOONS) IMPLANT
BALLOON ~~LOC~~ EUPHORA RX 2.75X12 (BALLOONS) IMPLANT
CATH INFINITI 5FR MULTPACK ANG (CATHETERS) ×1 IMPLANT
CATH VISTA GUIDE RCB (CATHETERS) ×1 IMPLANT
DEVICE WIRE ANGIOSEAL 6FR (Vascular Products) ×1 IMPLANT
KIT ENCORE 26 ADVANTAGE (KITS) ×1 IMPLANT
KIT HEART LEFT (KITS) ×3 IMPLANT
PACK CARDIAC CATHETERIZATION (CUSTOM PROCEDURE TRAY) ×3 IMPLANT
SHEATH PINNACLE 6F 10CM (SHEATH) ×1 IMPLANT
STENT PROMUS PREM MR 2.5X16 (Permanent Stent) ×1 IMPLANT
STOPCOCK MORSE 400PSI 3WAY (MISCELLANEOUS) ×1 IMPLANT
TRANSDUCER W/STOPCOCK (MISCELLANEOUS) ×3 IMPLANT
TUBING CIL FLEX 10 FLL-RA (TUBING) ×3 IMPLANT
WIRE COUGAR XT STRL 190CM (WIRE) ×2 IMPLANT
WIRE EMERALD 3MM-J .035X150CM (WIRE) ×1 IMPLANT
WIRE HI TORQ WHISPER MS 190CM (WIRE) ×1 IMPLANT

## 2015-10-29 NOTE — H&P (Signed)
     Patient ID: ASHIRA BARSOUM MRN: [id number] DOB/AGE: 02/02/1966 50 y.o. 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 EKG with inferior ST elevation. Code STEMI activated by ED staff and patient transported to [facility] for emergent cardiac cath. She has been followed in the past in our [place] office by Dr. Ron Parker but has not been seen in our office since 2009. There are no records of prior caths or her CABG procedure.   Review of systems complete and found to be negative unless listed above   Past Medical History  Diagnosis Date  . Lupus anticoagulant disorder (HCC)     on coumadin  . Respiratory failure ([place])     s/p tracheostomy 2002  . CAD (coronary artery disease)     s/p 2V CABG in 2007  . Diabetes mellitus ([place])   . HTN (hypertension)   . Hyperlipidemia   . Cervical cancer (HCC)     Family History  Problem Relation Age of Onset  . Hypertension Mother     Social History   Social History  . Marital Status: Married    Spouse Name: N/A  . Number of Children: N/A  . Years of Education: N/A   Occupational History  . Not on file.   Social History Main Topics  . Smoking status: Current Every Day Smoker -- 1.00 packs/day    Types: Cigarettes  . Smokeless tobacco: Not on file  . Alcohol Use: No  . Drug Use: No  . Sexual Activity: Not on file   Other Topics Concern  . Not on file   Social History Narrative    Past Surgical History  Procedure Laterality Date  . Coronary artery bypass graft  2007    2V  . Carotid stent    . Tracheostomy    . Cesarean section with bilateral tubal  ligation      Allergies  Allergen Reactions  . Penicillins     REACTION: rash    Prior to Admission Meds:  See computer list.   Physical Exam: Blood pressure 126/82, pulse 70bpm resp. rate 16, height 4' 9.87" (1.47 m), weight 250 lb (113.399 kg), SpO2 0 %.    General: Well developed, well nourished, acute distress, trach in place HEENT: OP clear, mucus membranes moist  SKIN: warm, dry. No rashes.  Neuro: No focal deficits  Musculoskeletal: Muscle strength 5/5 all ext  Psychiatric: Mood and affect normal  Neck: No JVD, no carotid bruits, no thyromegaly, no lymphadenopathy.  Lungs:Clear bilaterally, no wheezes, rhonci, crackles  Cardiovascular: Regular rate and rhythm. No murmurs, gallops or rubs.  Abdomen:Soft. Bowel sounds present. Non-tender.  Extremities: No lower extremity edema. Pulses are 2 + in the bilateral DP/PT.   Labs: pending    EKG: sinus, inferior ST elevation  ASSESSMENT AND PLAN:   1. Acute inferior STEMI: Plan emergent cardiac cath with probable PCI  2. HTN: Resume beta blocker  3. DM: Home meds. SSI.   4. Lupus anticoagulant/history of DVT: Check INR. Resume coumadin post PCI   Darlina Guys, MD 10/29/2015, 9:58 PM

## 2015-10-30 ENCOUNTER — Encounter (HOSPITAL_COMMUNITY): Payer: Self-pay

## 2015-10-30 ENCOUNTER — Inpatient Hospital Stay (HOSPITAL_COMMUNITY): Payer: Medicare Other

## 2015-10-30 DIAGNOSIS — J441 Chronic obstructive pulmonary disease with (acute) exacerbation: Secondary | ICD-10-CM | POA: Diagnosis present

## 2015-10-30 DIAGNOSIS — I2119 ST elevation (STEMI) myocardial infarction involving other coronary artery of inferior wall: Secondary | ICD-10-CM

## 2015-10-30 DIAGNOSIS — G4733 Obstructive sleep apnea (adult) (pediatric): Secondary | ICD-10-CM | POA: Diagnosis present

## 2015-10-30 DIAGNOSIS — I25810 Atherosclerosis of coronary artery bypass graft(s) without angina pectoris: Secondary | ICD-10-CM | POA: Insufficient documentation

## 2015-10-30 DIAGNOSIS — J9690 Respiratory failure, unspecified, unspecified whether with hypoxia or hypercapnia: Secondary | ICD-10-CM | POA: Diagnosis present

## 2015-10-30 LAB — COMPREHENSIVE METABOLIC PANEL
ALK PHOS: 111 U/L (ref 38–126)
ALT: 20 U/L (ref 14–54)
AST: 69 U/L — AB (ref 15–41)
Albumin: 3.1 g/dL — ABNORMAL LOW (ref 3.5–5.0)
Anion gap: 14 (ref 5–15)
BILIRUBIN TOTAL: 0.9 mg/dL (ref 0.3–1.2)
BUN: 7 mg/dL (ref 6–20)
CALCIUM: 9.2 mg/dL (ref 8.9–10.3)
CO2: 31 mmol/L (ref 22–32)
CREATININE: 0.93 mg/dL (ref 0.44–1.00)
Chloride: 91 mmol/L — ABNORMAL LOW (ref 101–111)
GFR calc non Af Amer: >60 mL/min (ref >60)
Glucose, Bld: 427 mg/dL — ABNORMAL HIGH (ref 65–99)
Potassium: 3.7 mmol/L (ref 3.5–5.1)
Sodium: 136 mmol/L (ref 135–145)
Total Protein: 7.1 g/dL (ref 6.5–8.1)

## 2015-10-30 LAB — TROPONIN I
TROPONIN I: 10.89 ng/mL — AB (ref <0.031)
TROPONIN I: 60.25 ng/mL — AB (ref <0.031)
TROPONIN I: 36.17 ng/mL — AB (ref <0.031)

## 2015-10-30 LAB — CBC
HCT: 53.4 % — ABNORMAL HIGH (ref 36.0–46.0)
HEMATOCRIT: 54.2 % — AB (ref 36.0–46.0)
HEMOGLOBIN: 17.8 g/dL — AB (ref 12.0–15.0)
Hemoglobin: 17.5 g/dL — ABNORMAL HIGH (ref 12.0–15.0)
MCH: 31.8 pg (ref 26.0–34.0)
MCH: 31.6 pg (ref 26.0–34.0)
MCHC: 32.8 g/dL (ref 30.0–36.0)
MCHC: 32.8 g/dL (ref 30.0–36.0)
MCV: 96.8 fL (ref 78.0–100.0)
MCV: 96.6 fL (ref 78.0–100.0)
PLATELETS: 217 10*3/uL (ref 150–400)
PLATELETS: 171 10*3/uL (ref 150–400)
RBC: 5.6 MIL/uL — AB (ref 3.87–5.11)
RBC: 5.53 MIL/uL — ABNORMAL HIGH (ref 3.87–5.11)
RDW: 14.2 % (ref 11.5–15.5)
RDW: 14.2 % (ref 11.5–15.5)
WBC: 11.8 10*3/uL — ABNORMAL HIGH (ref 4.0–10.5)
WBC: 11.7 10*3/uL — AB (ref 4.0–10.5)

## 2015-10-30 LAB — BASIC METABOLIC PANEL
Anion gap: 13 (ref 5–15)
Anion gap: 14 (ref 5–15)
BUN: 8 mg/dL (ref 6–20)
BUN: 8 mg/dL (ref 6–20)
CALCIUM: 9 mg/dL (ref 8.9–10.3)
CHLORIDE: 93 mmol/L — AB (ref 101–111)
CO2: 31 mmol/L (ref 22–32)
CO2: 32 mmol/L (ref 22–32)
CREATININE: 0.78 mg/dL (ref 0.44–1.00)
CREATININE: 0.74 mg/dL (ref 0.44–1.00)
Calcium: 9 mg/dL (ref 8.9–10.3)
Chloride: 91 mmol/L — ABNORMAL LOW (ref 101–111)
GFR calc Af Amer: >60 mL/min (ref >60)
GFR calc non Af Amer: >60 mL/min (ref >60)
GLUCOSE: 189 mg/dL — AB (ref 65–99)
Glucose, Bld: 261 mg/dL — ABNORMAL HIGH (ref 65–99)
POTASSIUM: 3.6 mmol/L (ref 3.5–5.1)
Potassium: 3.3 mmol/L — ABNORMAL LOW (ref 3.5–5.1)
Sodium: 137 mmol/L (ref 135–145)
Sodium: 137 mmol/L (ref 135–145)

## 2015-10-30 LAB — LIPID PANEL
Cholesterol: 216 mg/dL — ABNORMAL HIGH (ref 0–200)
HDL: 54 mg/dL (ref >40)
LDL Cholesterol: 134 mg/dL — ABNORMAL HIGH (ref 0–99)
TRIGLYCERIDES: 141 mg/dL (ref <150)
Total CHOL/HDL Ratio: 4 RATIO
VLDL: 28 mg/dL (ref 0–40)

## 2015-10-30 LAB — HEPATIC FUNCTION PANEL
ALBUMIN: 3.1 g/dL — AB (ref 3.5–5.0)
ALT: 33 U/L (ref 14–54)
AST: 143 U/L — AB (ref 15–41)
Alkaline Phosphatase: 109 U/L (ref 38–126)
BILIRUBIN TOTAL: 2.1 mg/dL — AB (ref 0.3–1.2)
Bilirubin, Direct: 0.4 mg/dL (ref 0.1–0.5)
Indirect Bilirubin: 1.7 mg/dL — ABNORMAL HIGH (ref 0.3–0.9)
Total Protein: 7.1 g/dL (ref 6.5–8.1)

## 2015-10-30 LAB — BLOOD GAS, ARTERIAL
Acid-Base Excess: 10.4 mmol/L — ABNORMAL HIGH (ref 0.0–2.0)
Bicarbonate: 34.7 mEq/L — ABNORMAL HIGH (ref 20.0–24.0)
DRAWN BY: 313941
FIO2: 0.98
O2 Saturation: 88 %
PO2 ART: 55.2 mmHg — AB (ref 80.0–100.0)
Patient temperature: 98.6
TCO2: 36.2 mmol/L (ref 0–100)
pCO2 arterial: 48.9 mmHg — ABNORMAL HIGH (ref 35.0–45.0)
pH, Arterial: 7.465 — ABNORMAL HIGH (ref 7.350–7.450)

## 2015-10-30 LAB — LACTIC ACID, PLASMA
LACTIC ACID, VENOUS: 2.4 mmol/L — AB (ref 0.5–2.0)
Lactic Acid, Venous: 2.6 mmol/L (ref 0.5–2.0)

## 2015-10-30 LAB — BRAIN NATRIURETIC PEPTIDE: B Natriuretic Peptide: 737.9 pg/mL — ABNORMAL HIGH (ref 0.0–100.0)

## 2015-10-30 LAB — INFLUENZA PANEL BY PCR (TYPE A & B)
H1N1FLUPCR: NOT DETECTED
INFLAPCR: NEGATIVE
Influenza B By PCR: NEGATIVE

## 2015-10-30 LAB — MRSA PCR SCREENING: MRSA by PCR: NEGATIVE

## 2015-10-30 LAB — GLUCOSE, CAPILLARY
GLUCOSE-CAPILLARY: 230 mg/dL — AB (ref 65–99)
GLUCOSE-CAPILLARY: 229 mg/dL — AB (ref 65–99)
Glucose-Capillary: 426 mg/dL — ABNORMAL HIGH (ref 65–99)
Glucose-Capillary: 235 mg/dL — ABNORMAL HIGH (ref 65–99)
Glucose-Capillary: 153 mg/dL — ABNORMAL HIGH (ref 65–99)

## 2015-10-30 LAB — POCT ACTIVATED CLOTTING TIME

## 2015-10-30 LAB — PROTIME-INR
INR: 8.97 (ref 0.00–1.49)
Prothrombin Time: 69.7 seconds — ABNORMAL HIGH (ref 11.6–15.2)

## 2015-10-30 LAB — PROCALCITONIN: Procalcitonin: <0.1 ng/mL

## 2015-10-30 LAB — RAPID STREP SCREEN (MED CTR MEBANE ONLY): STREPTOCOCCUS, GROUP A SCREEN (DIRECT): NEGATIVE

## 2015-10-30 MED ORDER — LEVOFLOXACIN IN D5W 750 MG/150ML IV SOLN
750.0000 mg | INTRAVENOUS | Status: DC
  Administered 2015-10-30: 750 mg via INTRAVENOUS
  Filled 2015-10-30 (×2): qty 150

## 2015-10-30 MED ORDER — IPRATROPIUM-ALBUTEROL 0.5-2.5 (3) MG/3ML IN SOLN
3.0000 mL | Freq: Four times a day (QID) | RESPIRATORY_TRACT | Status: DC
  Administered 2015-10-30 – 2015-10-31 (×6): 3 mL via RESPIRATORY_TRACT
  Filled 2015-10-30 (×7): qty 3

## 2015-10-30 MED ORDER — CHLORHEXIDINE GLUCONATE 0.12 % MT SOLN
15.0000 mL | Freq: Two times a day (BID) | OROMUCOSAL | Status: DC
  Administered 2015-10-30 – 2015-11-03 (×7): 15 mL via OROMUCOSAL
  Filled 2015-10-30 (×7): qty 15

## 2015-10-30 MED ORDER — PERFLUTREN LIPID MICROSPHERE
1.0000 mL | INTRAVENOUS | Status: AC | PRN
  Administered 2015-10-30: 3 mL via INTRAVENOUS
  Filled 2015-10-30: qty 10

## 2015-10-30 MED ORDER — VANCOMYCIN HCL IN DEXTROSE 1-5 GM/200ML-% IV SOLN
1000.0000 mg | Freq: Two times a day (BID) | INTRAVENOUS | Status: DC
  Administered 2015-10-30 – 2015-10-31 (×2): 1000 mg via INTRAVENOUS
  Filled 2015-10-30 (×3): qty 200

## 2015-10-30 MED ORDER — ARFORMOTEROL TARTRATE 15 MCG/2ML IN NEBU
15.0000 ug | INHALATION_SOLUTION | Freq: Two times a day (BID) | RESPIRATORY_TRACT | Status: DC
  Administered 2015-10-30 – 2015-11-03 (×9): 15 ug via RESPIRATORY_TRACT
  Filled 2015-10-30 (×9): qty 2

## 2015-10-30 MED ORDER — PERFLUTREN LIPID MICROSPHERE
INTRAVENOUS | Status: AC
  Filled 2015-10-30: qty 10

## 2015-10-30 MED ORDER — FUROSEMIDE 10 MG/ML IJ SOLN
40.0000 mg | Freq: Once | INTRAMUSCULAR | Status: AC
  Administered 2015-10-30: 40 mg via INTRAVENOUS

## 2015-10-30 MED ORDER — BUDESONIDE 0.5 MG/2ML IN SUSP
0.5000 mg | Freq: Two times a day (BID) | RESPIRATORY_TRACT | Status: DC
  Administered 2015-10-30 – 2015-11-03 (×9): 0.5 mg via RESPIRATORY_TRACT
  Filled 2015-10-30 (×9): qty 2

## 2015-10-30 MED ORDER — CETYLPYRIDINIUM CHLORIDE 0.05 % MT LIQD
7.0000 mL | Freq: Two times a day (BID) | OROMUCOSAL | Status: DC
  Administered 2015-10-30 – 2015-11-03 (×3): 7 mL via OROMUCOSAL

## 2015-10-30 MED ORDER — METOPROLOL TARTRATE 50 MG PO TABS
50.0000 mg | ORAL_TABLET | Freq: Two times a day (BID) | ORAL | Status: DC
  Administered 2015-10-30 – 2015-11-03 (×7): 50 mg via ORAL
  Filled 2015-10-30 (×8): qty 1

## 2015-10-30 MED FILL — Heparin Sodium (Porcine) 2 Unit/ML in Sodium Chloride 0.9%: INTRAMUSCULAR | Qty: 1000 | Status: AC

## 2015-10-30 NOTE — Trach Care Team (Signed)
Trach Care Progression Note   Patient Details Name: Theresa [name] MRN: [id number] DOB: 1966/06/25 Today's Date: 10/30/2015   Tracheostomy Assessment    Tracheostomy Silver Glennon Mac Uncuffed (Active)  Status Secured 10/30/2015 12:01 PM  Site Assessment Clean;Dry 10/30/2015 12:01 PM  Site Care Open to air 10/30/2015 12:01 PM  Ties Assessment Secure;Dry;Clean 10/30/2015 12:01 PM  Cuff pressure (cm) 0 cm 10/30/2015  7:35 AM  Emergency Equipment at bedside Yes 10/30/2015 12:01 PM     Care Needs     Respiratory Therapy O2 Device: Tracheostomy Collar FiO2 (%): 98 % SpO2: 91 %    Speech Language Pathology      Physical Therapy      Occupational Therapy      Nutritional Patient's Current Diet: Clear liquid    Case Management/Social Work      Provider [place] Team/Provider                   Recommendations [place] Team Members Present-  Doris Cheadle and Vincent Gros, RD    Chronic trach - on Trach collar. None at present - continue to follow.           Kartel Wolbert, Jaci Carrel 10/30/2015, 2:31 PM Scribe for Team

## 2015-10-30 NOTE — Progress Notes (Signed)
Lactic Acid 2.6 called to Dr Charolette Forward de Dios. No new orders at this time.

## 2015-10-30 NOTE — Progress Notes (Signed)
Echocardiogram 2D Echocardiogram with Definity has been performed.  Theresa [name] 10/30/2015, 11:28 AM

## 2015-10-30 NOTE — Progress Notes (Signed)
10/30/2015- Respiratory care note- Pt has a metal jackson #6 trach in place.  Per patient, she has been trached since 2002.  Trach site clean and dry at time of assessment.  Pt currently on 98% ATC with sats of 91% at time of assessment.  Trach care supplies at bedside.  Will cont to follow progress.

## 2015-10-30 NOTE — Progress Notes (Signed)
CARDIAC REHAB PHASE I   Spoke with RN, who states pt is requiring significant oxygen supplementation to maintain O2 sats. Will hold ambulation today, will follow-up tomorrow.  Lenna Sciara, RN, BSN 10/30/2015 1:06 PM

## 2015-10-30 NOTE — Progress Notes (Signed)
Sputum sample obtained and sent to lab by RT. 

## 2015-10-30 NOTE — Progress Notes (Signed)
SUBJECTIVE: feels better now. No longer having any cp. Just feels tired. Denies any sob/n/v/fever/chills.   BP 133/70 mmHg  Pulse 86  Temp(Src) 98.2 F (36.8 C) (Oral)  Resp 24  Ht 4\' 9"  (1.448 m)  Wt 106.4 kg (234 lb 9.1 oz)  BMI 50.75 kg/m2  SpO2 90%  Intake/Output Summary (Last 24 hours) at 10/30/15 0736 Last data filed at 10/30/15 0700  Gross per 24 hour  Intake    975 ml  Output    950 ml  Net     25 ml    PHYSICAL EXAM General: obese female, pleasant, NAD Psych:  Good affect, responds appropriately Neck: No JVD. No masses noted. Has trach collar on.  Lungs: coarse breathe sounds Heart: RRR with no murmurs noted. Abdomen: Bowel sounds are present. Soft, non-tender.  Extremities: No lower extremity edema.   LABS: Basic Metabolic Panel:  Recent Labs  10/29/15 2319 10/30/15 0445  NA 136 137  K 3.7 3.6  CL 91* 93*  CO2 31 31  GLUCOSE 427* 261*  BUN 7 8  CREATININE 0.93 0.78  CALCIUM 9.2 9.0   CBC:  Recent Labs  10/29/15 2319 10/30/15 0445  WBC 8.8 11.8*  HGB 18.0* 17.8*  HCT 54.5* 54.2*  MCV 97.0 96.8  PLT 190 217   Cardiac Enzymes:  Recent Labs  10/29/15 2319 10/30/15 0445  TROPONINI 10.89* 60.25*   Fasting Lipid Panel:  Recent Labs  10/30/15 0445  CHOL 216*  HDL 54  LDLCALC 134*  TRIG 141  CHOLHDL 4.0    Current Meds: . antiseptic oral rinse  7 mL Mouth Rinse q12n4p  . aspirin  81 mg Oral Daily  . atorvastatin  80 mg Oral q1800  . chlorhexidine  15 mL Mouth Rinse BID  . furosemide  40 mg Intravenous Q12H  . gabapentin  100 mg Oral QHS  . insulin aspart  0-15 Units Subcutaneous TID WC  . lisinopril  20 mg Oral Daily  . metoprolol tartrate  25 mg Oral BID  . sodium chloride flush  3 mL Intravenous Q12H  . ticagrelor  90 mg Oral BID     ASSESSMENT AND PLAN:  Active Problems:   Acute ST elevation myocardial infarction (STEMI) involving right coronary artery (HCC)   Tobacco abuse   ST elevation myocardial  infarction (STEMI) of inferior wall ([place])   50 yo female with hx of CAD s/p CABG x2 and stents in the past, HLD, HTN, DM II, hx of DVT on coumadin, presented with CP found to have STEMI of inferior leads, s/p emergent CATH.  Cath summary:   1. Acute inferior STEMI secondary to sub-total occlusion anastomosis of SVG to PDA 2. CAD s/p 2 V CABG with patent SVG to PDA (with thrombus) and patent LIMA to LAD 3. Chronic total occlusion proximal LAD 4. Patent stent proximal Circumflex 5. Chronic total occlusion proximal RCA 6. Successful PTCA/DES x 1 distal body of SVG to PDA   Acute inferior wall STEMI - s/p emergent LHC showing sub total occlusion of SVG to PDA anastomosis s/p DES.  - continue asa and brilinta for now. Hold coumadin as INR is supratherapeutic, resume coumadin when INR <2.at that time would continue brilnta and coumadin, d/c asa.  - cont metoprolol , lipitor 80mg , lisinopril 20mg  daily -f/up Echo to assess EF.   HTN - cont metoprolol and lisinopril. May consider increasing metoprolol dose as HR and BP are both on higher side.   DVT/Lupus  anticoagulant - hold coumadin for now, resume when INR <2  DM II - unknown hgba1c. Hold metformin, continue SSI for now.   Tobacco abuse- 1/2 PPD current smoker. I counseled on smoking cessation and encouraged her to continue this discussion with her PCP.   Addendum: later when rounding, patient was having SOB, needed to increased oxygenation.  - continue lasix 40 mg IV BID.  - neb treatment x6 hr   Ahmed, Chesley Mires  2/23/20177:36 AM  Attending Note:   The patient was seen and examined.  Agree with assessment and plan as noted above.  Changes made to the above note as needed.  Pt is s/p PCI yesterday . Very short of breath.   Requiring additional oxygen today .  Will give lasix, Start nebs.  She needs to quit smoking  No angina   Will keep in ICU today    Ramond Dial., MD, [facility] 10/30/2015, 10:34 AM [address]  [place] Pager [phone]

## 2015-10-30 NOTE — Progress Notes (Signed)
Utilization review completed. Jadavion Spoelstra, RN, BSN. 

## 2015-10-30 NOTE — Progress Notes (Signed)
Pharmacy Antibiotic Note  Theresa [name] is a 50 y.o. female admitted on 10/29/2015 s/p STEMI with DES of SVG to PDA.  Pt has chronic trach, increasing O2 requirements, WBC 11 afebrile Cxray opacities looks pulmonary edema.    Pharmacy has been consulted for vancomycin and Levofloxacin- insetting of PCN allergy) QTc 400)  Dosing for R/O pna.  Plan: Levofloxacin 750mg  q24 Vancomycin 1gm q12  Height: 4\' 9"  (144.8 cm) Weight: 234 lb 9.1 oz (106.4 kg) IBW/kg (Calculated) : 38.6  Temp (24hrs), Avg:98 F (36.7 C), Min:97.9 F (36.6 C), Max:98.2 F (36.8 C)   Recent Labs Lab 10/29/15 2319 10/30/15 0445 10/30/15 1222  WBC 8.8 11.8*  --   CREATININE 0.93 0.78  --   LATICACIDVEN  --   --  2.6*    Estimated Creatinine Clearance: 87.3 mL/min (by C-G formula based on Cr of 0.78).    Allergies  Allergen Reactions  . Penicillins Rash    Has patient had a PCN reaction causing immediate rash, facial/tongue/throat swelling, SOB or lightheadedness with hypotension: Yes Has patient had a PCN reaction causing severe rash involving mucus membranes or skin necrosis: No Has patient had a PCN reaction that required hospitalization No Has patient had a PCN reaction occurring within the last 10 years: No If all of the above answers are "NO", then may proceed with Cephalosporin use.   REACTION: rash    Antimicrobials this admission:   Dose adjustments this admission:   Microbiology results:  Bonnita Nasuti Pharm.D. CPP, BCPS Clinical Pharmacist [phone] 10/30/2015 2:42 PM

## 2015-10-30 NOTE — H&P (Signed)
Name: Theresa [name] MRN: [id number] DOB: 1966-05-03    ADMISSION DATE:  10/29/2015 CONSULTATION DATE:  10/30/15  REFERRING MD :  Dr. Angelena Form  CHIEF COMPLAINT:  Admitted for STEMI.   BRIEF PATIENT DESCRIPTION:  36F, morbidly obese, on chronic trache (#6 metal since 2002 after a prolonged intubation for resp fx), with COPD, CAD, diabetes, presenting with chest pain. Admitted as a STEMI. Had cath and stent on RCA on 2/22. With increasing FiO2 requirements. She is currently on 100% FiO2 via trach collar. Recent cough, congestion. PCCM consulted.   SIGNIFICANT EVENTS  10/29/15 admitted for STEMI. Had LHC and RCA stent 10/30/15 worsening resp status. On 100% FiO2 via TC. PCCM consulted.   STUDIES:    HISTORY OF PRESENT ILLNESS:   50 yo female with history of CAD s/p reported 2V CABG and prior stenting before her CABG (no records available regarding coronary procedures-stents in [place] and CABG in [place] 2007), DM, HTN, HLD, femoral artery aneurysm s/p ? Stenting procedure, lupus anti-coagulant/DVT on chronic coumadin therapy who presented to [facility] ED with c/o chest pain. EKG with inferior ST elevation. Code STEMI activated by ED staff and patient transported to [facility] for emergent cardiac cath.    PAST MEDICAL HISTORY :   has a past medical history of Lupus anticoagulant disorder ([place]); Respiratory failure ([place]); CAD (coronary artery disease); Diabetes mellitus ([place]); HTN (hypertension); Hyperlipidemia; and Cervical cancer ([place]).  has past surgical history that includes Coronary artery bypass graft (2007); Carotid stent; Tracheostomy; Cesarean section with bilateral tubal ligation; Cardiac catheterization (N/A, 10/29/2015); and Cardiac catheterization (10/29/2015).   Patient was admitted for 3 months for respiratory failure in 2002. This was in [place]. She ended up having a trach after that. She has a #6 metal trach since 2002. She was following with an ENT over at [facility] but has not  seen him in years. She takes care for trach. Has had no issues with trach.  Positive for sleep apnea. Has COPD.  Chronic hypoxemia. Uses 4 L oxygen at bedtime. Uses oxygen 2-3 L when necessary.  History of blood clot in the left leg related to her 2002 hospitalization. She has been on Coumadin since that time. No issues with bleeding.  Prior to Admission medications   Medication Sig Start Date End Date Taking? Authorizing Provider  nitroGLYCERIN (NITROSTAT) 0.4 MG SL tablet Place 0.4 mg under the tongue every 5 (five) minutes as needed for chest pain.   Yes Historical Provider, MD  simethicone (GAS-X) 80 MG chewable tablet Chew 80 mg by mouth every 6 (six) hours as needed for flatulence.   Yes Historical Provider, MD  diazepam (VALIUM) 5 MG tablet Take 5 mg by mouth at bedtime. Take every night per patient    Historical Provider, MD  furosemide (LASIX) 40 MG tablet Take 40 mg by mouth daily as needed for fluid.    Historical Provider, MD  gabapentin (NEURONTIN) 100 MG capsule Take 100 mg by mouth 2 (two) times daily.     Historical Provider, MD  lisinopril (PRINIVIL,ZESTRIL) 20 MG tablet Take 20 mg by mouth daily.    Historical Provider, MD  metFORMIN (GLUCOPHAGE) 500 MG tablet Take 500 mg by mouth 2 (two) times daily with a meal.    Historical Provider, MD  metoprolol (LOPRESSOR) 50 MG tablet Take 50 mg by mouth 2 (two) times daily.    Historical Provider, MD  ondansetron (ZOFRAN ODT) 4 MG disintegrating tablet Take 1 tablet (4 mg total) by mouth  every 8 (eight) hours as needed for nausea or vomiting. 05/20/15   Mayer Camel, MD  oxyCODONE (ROXICODONE) 5 MG immediate release tablet Take 1 tablet (5 mg total) by mouth every 6 (six) hours as needed for severe pain. 05/20/15   Mayer Camel, MD  pravastatin (PRAVACHOL) 40 MG tablet Take 40 mg by mouth daily.    Historical Provider, MD  warfarin (COUMADIN) 5 MG tablet Take 2.5-5 mg by mouth daily. Take 5mg  every day except on sundays take half  (2.5MG )    Historical Provider, MD   Allergies  Allergen Reactions  . Penicillins Rash    Has patient had a PCN reaction causing immediate rash, facial/tongue/throat swelling, SOB or lightheadedness with hypotension: Yes Has patient had a PCN reaction causing severe rash involving mucus membranes or skin necrosis: No Has patient had a PCN reaction that required hospitalization No Has patient had a PCN reaction occurring within the last 10 years: No If all of the above answers are "NO", then may proceed with Cephalosporin use.   REACTION: rash    FAMILY HISTORY:  family history includes Hypertension in her mother.  No history of lung disease in the family. SOCIAL HISTORY:  reports that she has been smoking Cigarettes.  She has been smoking about 1.00 pack per day. She does not have any smokeless tobacco history on file. She reports that she does not drink alcohol or use illicit drugs.  50-pack-year smoking history. On disability. Married. Was a Event organiser until 2002. Denies drinking alcohol. She was in [place] but moved up here in 2002 for more social support   REVIEW OF SYSTEMS:   Constitutional: Negative for fever, chills, weight loss, malaise/fatigue and diaphoresis.  HENT: Negative for hearing loss, ear pain, nosebleeds, congestion, sore throat, neck pain, tinnitus and ear discharge.   Eyes: Negative for blurred vision, double vision, photophobia, pain, discharge and redness.  Respiratory: had cough, congestion, increased tracheal secretions the last 4-5 days. (-) hemoptysis, (+)sputum production, shortness of breath,(-) wheezing and stridor.   Cardiovascular: had chest pain, (-) palpitations, orthopnea, claudication, leg swelling and PND.  Gastrointestinal: Negative for heartburn, nausea, vomiting, abdominal pain, diarrhea, constipation, blood in stool and melena. (+) nausea/vomiting Genitourinary: Negative for dysuria, urgency, frequency, hematuria and flank pain.    Musculoskeletal: Negative for myalgias, back pain, joint pain and falls.  Skin: Negative for itching and rash.  Neurological: Negative for dizziness, tingling, tremors, sensory change, speech change, focal weakness, seizures, loss of consciousness, weakness and headaches.  Endo/Heme/Allergies: Negative for environmental allergies and polydipsia. Does not bruise/bleed easily.  SUBJECTIVE:   VITAL SIGNS: Temp:  [97.9 F (36.6 C)-98.2 F (36.8 C)] 97.9 F (36.6 C) (02/23 0800) Pulse Rate:  [0-177] 88 (02/23 1000) Resp:  [0-60] 19 (02/23 1000) BP: (93-193)/(60-127) 135/78 mmHg (02/23 1000) SpO2:  [0 %-95 %] 88 % (02/23 1029) FiO2 (%):  [60 %-98 %] 98 % (02/23 1029) Weight:  [234 lb 9.1 oz (106.4 kg)-250 lb (113.399 kg)] 234 lb 9.1 oz (106.4 kg) (02/23 0000)  PHYSICAL EXAMINATION: General:  In mild respiratory distress. Otherwise comfortable. Neuro:  CNl nerves grossly intact. No lateralizing sign HEENT:  Pupils equal reactive bilaterally. No neck vein distention. No lymphadenopathy. Has a metal trach, #6 in place. Grossly looks clean. No purulent discharge seen. Cardiovascular:  Good S1-S2. No S3 murmur rub or gallop. Lungs:  Fair air entry. Diminished breath sounds at the bases. Bibasilar crackles. Abdomen:  Positive bowel sounds, soft. Obese nontender no masses palpated. Musculoskeletal:  Grade 2 edema. No clubbing cyanosis. Skin:  Warm and dry.   Recent Labs Lab 10/29/15 2319 10/30/15 0445  NA 136 137  K 3.7 3.6  CL 91* 93*  CO2 31 31  BUN 7 8  CREATININE 0.93 0.78  GLUCOSE 427* 261*    Recent Labs Lab 10/29/15 2319 10/30/15 0445  HGB 18.0* 17.8*  HCT 54.5* 54.2*  WBC 8.8 11.8*  PLT 190 217   No results found.   [name]:  28 female, with significant comorbidities CAD, COPD, sleep apnea, lupus anticoagulant, morbid obesity, diabetes, with a chronic trach. Admitted for chest pain and STEMI. Had a left heart cath on 2/22 and had stent to her RCA. With  increasing oxygen requirement on 2/23. Currently on 100% trach collar.  ASSESSMENT / PLAN: A> acute hypoxemic respiratory failure secondary to the following: Possible pneumonia, STEMI, possible fluid overload, obstructive sleep apnea, acute exacerbation of COPD. Patient may have underlying pulmonary hypertension. Patient with recent increase tracheal secretions the last 4 days with some nausea and vomiting --influenza is being considered. P> -Continue on 100% trach collar. Her sats are 90%. We need to get a blood gas. She may end up being on the ventilator if she gets more hypoxemic. -Check influenza -Check blood culture, sputum culture, lactic acid, MRSA screen. Check Legionella and strep urinary antigen. -Needs chest x-ray now. ABG now. -We'll start Pulmicort and Brovana twice a day and DuoNeb every 6 hours. -I will hold off on antibiotics pending chest x-ray. -Agree with diuresis -2-D echo was done this morning.  A> CAD. STEMI. RCA stent -Per primary  A> history of DVT. Would lupus anticoagulant. --On chronic Coumadin. She had INR on admission which was 9. -Anticoagulation per primary.  A> other issues per primary.   I spent at least 30 minutes of critical care time with this patient today. I extensively discussed the plan of care with the patient and she is in agreement.     Caralee Ates, MD Pulmonary and [place] Pager: [phone] After 3 pm or if no answer, call [phone]  10/30/2015, 11:45 AM

## 2015-10-30 NOTE — Progress Notes (Signed)
CRITICAL VALUE ALERT  Critical value received: Troponin 10.89 and INR 8.91  Date of notification:  10/29/15  Time of notification:  2315  Critical value read back:Yes.    Nurse who received alert:  Arnetha Courser, RN  MD notified (1st Jazmarie Biever):  Clayborne Artist, MD  Time of first Tahjae Durr:  0000

## 2015-10-30 NOTE — Progress Notes (Signed)
CRITICAL VALUE ALERT  Critical value received: Troponin 60.25  Date of notification:  10/30/2015  Time of notification:  0615  Critical value read back: No, lab didn't call  Nurse who received alert:  Arnetha Courser, RN   MD notified (1st Ranjit Ashurst):  B.Hager  Time of first Aristide Waggle:  [phone]  Responding MD:  B.Hager  Time MD responded:  [phone]

## 2015-10-30 NOTE — Progress Notes (Signed)
Inpatient Diabetes Program Recommendations  AACE/ADA: New Consensus Statement on Inpatient Glycemic Control (2015)  Target Ranges:  Prepandial:   less than 140 mg/dL      Peak postprandial:   less than 180 mg/dL (1-2 hours)      Critically ill patients:  140 - 180 mg/dL   Review of Glycemic Control:  Results for NORIAH, STJULIAN (MRN [id number]) as of 10/30/2015 11:06  Ref. Range 10/29/2015 22:42 10/30/2015 08:41  Glucose-Capillary Latest Ref Range: 65-99 mg/dL 426 (H) 235 (H)    Diabetes history: Type 2 diabetes Outpatient Diabetes medications: Metformin 500 mg bid,  Current orders for Inpatient glycemic control:  Novolog moderate tid with meals   Inpatient Diabetes Program Recommendations:    Note that CBG's continue to be greater than goal.  Please consider adding Lantus 20 units daily.   Thanks, Adah Perl, RN, BC-ADM Inpatient Diabetes Coordinator Pager [phone] (8a-5p)

## 2015-10-31 ENCOUNTER — Inpatient Hospital Stay (HOSPITAL_COMMUNITY): Payer: Medicare Other

## 2015-10-31 DIAGNOSIS — I5032 Chronic diastolic (congestive) heart failure: Secondary | ICD-10-CM | POA: Diagnosis present

## 2015-10-31 DIAGNOSIS — J9601 Acute respiratory failure with hypoxia: Secondary | ICD-10-CM

## 2015-10-31 LAB — PROTIME-INR
INR: 1.58 — ABNORMAL HIGH (ref 0.00–1.49)
PROTHROMBIN TIME: 18.9 s — AB (ref 11.6–15.2)

## 2015-10-31 LAB — BASIC METABOLIC PANEL
Anion gap: 13 (ref 5–15)
BUN: 8 mg/dL (ref 6–20)
CALCIUM: 8.9 mg/dL (ref 8.9–10.3)
CO2: 34 mmol/L — ABNORMAL HIGH (ref 22–32)
Chloride: 90 mmol/L — ABNORMAL LOW (ref 101–111)
Creatinine, Ser: 0.66 mg/dL (ref 0.44–1.00)
GLUCOSE: 180 mg/dL — AB (ref 65–99)
Potassium: 3.2 mmol/L — ABNORMAL LOW (ref 3.5–5.1)
SODIUM: 137 mmol/L (ref 135–145)

## 2015-10-31 LAB — GLUCOSE, CAPILLARY
GLUCOSE-CAPILLARY: 175 mg/dL — AB (ref 65–99)
Glucose-Capillary: 160 mg/dL — ABNORMAL HIGH (ref 65–99)
Glucose-Capillary: 274 mg/dL — ABNORMAL HIGH (ref 65–99)
Glucose-Capillary: 277 mg/dL — ABNORMAL HIGH (ref 65–99)

## 2015-10-31 LAB — LEGIONELLA ANTIGEN, URINE

## 2015-10-31 LAB — MAGNESIUM: MAGNESIUM: 1.5 mg/dL — AB (ref 1.7–2.4)

## 2015-10-31 MED ORDER — POTASSIUM CHLORIDE CRYS ER 20 MEQ PO TBCR
40.0000 meq | EXTENDED_RELEASE_TABLET | Freq: Every day | ORAL | Status: DC
  Administered 2015-10-31 – 2015-11-02 (×3): 40 meq via ORAL
  Filled 2015-10-31 (×3): qty 2

## 2015-10-31 MED ORDER — WARFARIN SODIUM 5 MG PO TABS
5.0000 mg | ORAL_TABLET | Freq: Once | ORAL | Status: AC
  Administered 2015-10-31: 5 mg via ORAL
  Filled 2015-10-31: qty 1

## 2015-10-31 MED ORDER — WARFARIN - PHARMACIST DOSING INPATIENT: Freq: Every day | Status: DC

## 2015-10-31 MED ORDER — POTASSIUM CHLORIDE CRYS ER 20 MEQ PO TBCR
20.0000 meq | EXTENDED_RELEASE_TABLET | ORAL | Status: AC
  Administered 2015-10-31 (×2): 20 meq via ORAL
  Filled 2015-10-31 (×2): qty 1

## 2015-10-31 MED ORDER — MAGNESIUM SULFATE 2 GM/50ML IV SOLN
2.0000 g | Freq: Once | INTRAVENOUS | Status: AC
  Administered 2015-10-31: 2 g via INTRAVENOUS
  Filled 2015-10-31: qty 50

## 2015-10-31 NOTE — Progress Notes (Signed)
Patient with recent respiratory infection. MD states to do Influenza PCR/ Strep PCR. Will place on contact and inform visitors until infectious process ruled out.

## 2015-10-31 NOTE — Progress Notes (Signed)
1310 Pt not appropriate for our services at this time due to resp issues. We will continue to follow. Graylon Good RN BSN 10/31/2015 1:14 PM

## 2015-10-31 NOTE — Progress Notes (Signed)
SUBJECTIVE: feeling better today. Breathing is better. Nebs are helping. No chest pain or any other complaint.  BP 111/79 mmHg  Pulse 66  Temp(Src) 97.5 F (36.4 C) (Oral)  Resp 24  Ht 4\' 9"  (1.448 m)  Wt 106.4 kg (234 lb 9.1 oz)  BMI 50.75 kg/m2  SpO2 97%  Intake/Output Summary (Last 24 hours) at 10/31/15 0753 Last data filed at 10/31/15 0700  Gross per 24 hour  Intake   1420 ml  Output   3500 ml  Net  -2080 ml    PHYSICAL EXAM General: obese female, pleasant, NAD Psych:  Good affect, responds appropriately Neck: No JVD. No masses noted. Has trach collar on.  Lungs: crackles on bases.  Heart: RRR with no murmurs noted. Abdomen: Bowel sounds are present. Soft, non-tender.  Extremities: No lower extremity edema.   LABS: Basic Metabolic Panel:  Recent Labs  10/30/15 2035 10/31/15 0540  NA 137 137  K 3.3* 3.2*  CL 91* 90*  CO2 32 34*  GLUCOSE 189* 180*  BUN 8 8  CREATININE 0.74 0.66  CALCIUM 9.0 8.9   CBC:  Recent Labs  10/30/15 0445 10/30/15 2035  WBC 11.8* 11.7*  HGB 17.8* 17.5*  HCT 54.2* 53.4*  MCV 96.8 96.6  PLT 217 171   Cardiac Enzymes:  Recent Labs  10/29/15 2319 10/30/15 0445 10/30/15 1122  TROPONINI 10.89* 60.25* 36.17*   Fasting Lipid Panel:  Recent Labs  10/30/15 0445  CHOL 216*  HDL 54  LDLCALC 134*  TRIG 141  CHOLHDL 4.0    Current Meds: . antiseptic oral rinse  7 mL Mouth Rinse q12n4p  . arformoterol  15 mcg Nebulization BID  . aspirin  81 mg Oral Daily  . atorvastatin  80 mg Oral q1800  . budesonide (PULMICORT) nebulizer solution  0.5 mg Nebulization BID  . chlorhexidine  15 mL Mouth Rinse BID  . furosemide  40 mg Intravenous Q12H  . gabapentin  100 mg Oral QHS  . insulin aspart  0-15 Units Subcutaneous TID WC  . ipratropium-albuterol  3 mL Nebulization Q6H  . levofloxacin (LEVAQUIN) IV  750 mg Intravenous Q24H  . lisinopril  20 mg Oral Daily  . metoprolol tartrate  50 mg Oral BID  . potassium chloride   20 mEq Oral Q4H  . sodium chloride flush  3 mL Intravenous Q12H  . ticagrelor  90 mg Oral BID  . vancomycin  1,000 mg Intravenous Q12H     ASSESSMENT AND PLAN:  Active Problems:   Acute ST elevation myocardial infarction (STEMI) involving right coronary artery (HCC)   Tobacco abuse   ST elevation myocardial infarction (STEMI) of inferior wall (HCC)   Respiratory failure (HCC)   Coronary artery disease involving coronary bypass graft of native heart without angina pectoris   OSA (obstructive sleep apnea)   COPD exacerbation ([place])   50 yo female with hx of CAD s/p CABG x2 and stents in the past, HLD, HTN, DM II, hx of DVT on coumadin, presented with CP found to have STEMI of inferior leads, s/p emergent CATH 10/29/15  Cath summary:   1. Acute inferior STEMI secondary to sub-total occlusion anastomosis of SVG to PDA 2. CAD s/p 2 V CABG with patent SVG to PDA (with thrombus) and patent LIMA to LAD 3. Chronic total occlusion proximal LAD 4. Patent stent proximal Circumflex 5. Chronic total occlusion proximal RCA 6. Successful PTCA/DES x 1 distal body of SVG to PDA   Acute  inferior wall STEMI - s/p emergent LHC and DES to SVG to PDA anastomosis. ECHO done showed preserved EF 50-55%, septal hypokinesis, restrictive cardiomyopathy based on echo. Mild MR. Increased PA pressure of 40. - continue asa and brilinta for now. Hold coumadin as INR is supratherapeutic, resume coumadin when INR <2.at that time would continue brilnta and coumadin, d/c asa.  - cont metoprolol 50 mg bid, lipitor 80mg , lisinopril 20mg  daily - cont lasix 40 mg IV BID -f/up Echo to assess EF.   Acute hypoxic resp failure - likely 2/2 to diastolic CHF. This is less likely infectious in etiology and more likely 2/2 to recent cardiopulm stress from her MI causing distress with her poor baseline resp status (COPD and Pulm HTN). Has Pulm HTN and restrictive cardiomyopathy on ECHO.   CXR suggestive of pulm edema which  improved after lasix IV. Her breathing is also better now but still has crackles one exam. Also had wheezing on exam which is better now. Strep neg, flu neg.  - appreciate pulm recs: continue pulmicort + brovana along with duoneb q6hr - since PCT negative and patient is afebrile and without any other signs of infection - will d/c levaquin + vanc ( got 1 dose of each).  - continue IV lasix 40 mg BID  Hypokalemia - replete PRN. Check mag.  HTN - cont metoprolol and lisinopril. May consider increasing metoprolol dose if HR remains high  DVT/Lupus anticoagulant - hold coumadin for now, resume when INR <2 per pharmacy. D/c asa when on coumadin.  DM II - unknown hgba1c. Hold metformin, continue SSI for now.   Tobacco abuse- 1/2 PPD current smoker. I counseled on smoking cessation and encouraged her to continue this discussion with her PCP.     Theresa [name]  2/24/20177:53 AM    Attending Note:   The patient was seen and examined.  Agree with assessment and plan as noted above.  Changes made to the above note as needed.  CAD - appears stable , no angina Encourage her to stop smoking   2. CHF - likely diastolic CHF.  Better on lasix and nebs  Continue.  3.   Thayer Headings, Brooke Bonito., MD, [facility] 10/31/2015, 11:24 AM [address],  [place] Pager [phone]

## 2015-10-31 NOTE — Progress Notes (Signed)
Name: Theresa [name] MRN: [id number] DOB: Jul 16, 1966    ADMISSION DATE:  10/29/2015 CONSULTATION DATE:  10/30/15  REFERRING MD :  Dr. Angelena Form  CHIEF COMPLAINT:  Admitted for STEMI.   BRIEF PATIENT DESCRIPTION:    50 yo female with history of CAD s/p reported 2V CABG and prior stenting before her CABG (no records available regarding coronary procedures-stents in [place] and CABG in [place] 2007), DM, HTN, HLD, femoral artery aneurysm s/p ? Stenting procedure, lupus anti-coagulant/DVT on chronic coumadin therapy who presented to [facility] ED with c/o chest pain. EKG with inferior ST elevation. Code STEMI activated by ED staff and patient transported to [facility] for emergent cardiac cath.    Patient was admitted for 3 months for respiratory failure in 2002. This was in [place]. She ended up having a trach after that. She has a #6 metal trach since 2002. She was following with an ENT over at [facility] but has not seen him in years. She takes care for trach. Has had no issues with trach.  Positive for sleep apnea. Has COPD.  Chronic hypoxemia. Uses 4 L oxygen at bedtime. Uses oxygen 2-3 L when necessary.  History of blood clot in the left leg related to her 2002 hospitalization. She has been on Coumadin since that time. No issues with bleeding.   SIGNIFICANT EVENTS  10/29/15 admitted for STEMI. Had LHC and RCA stent 10/30/15 worsening resp status. On 100% FiO2 via TC. PCCM consulted.   SUBJECTIVE:  10/31/15 - echo yesterday PASP 40, LVEF 55%, Diast dysfn +.. TROP yest VEY HIGH. Improved fio2 needs to 80% ATC   VITAL SIGNS: Temp:  [97.5 F (36.4 C)-99.3 F (37.4 C)] 97.6 F (36.4 C) (02/24 0800) Pulse Rate:  [66-105] 73 (02/24 0800) Resp:  [14-32] 18 (02/24 0800) BP: (88-121)/(64-90) 88/64 mmHg (02/24 0800) SpO2:  [88 %-98 %] 95 % (02/24 0800) FiO2 (%):  [80 %-98 %] 80 % (02/24 0800)  PHYSICAL EXAMINATION: General:  In mild respiratory distress. Otherwise comfortable. Neuro:  CNl  nerves grossly intact. No lateralizing sign HEENT:  Pupils equal reactive bilaterally. No neck vein distention. No lymphadenopathy. Has a metal trach, #6 in place. Grossly looks clean. No purulent discharge seen. Cardiovascular:  Good S1-S2. No S3 murmur rub or gallop. Lungs:  Fair air entry. Diminished breath sounds at the bases. Bibasilar crackles. Abdomen:  Positive bowel sounds, soft. Obese nontender no masses palpated. Musculoskeletal:  Grade 2 edema. No clubbing cyanosis. Skin:  Warm and dry.   PULMONARY  Recent Labs Lab 10/29/15 2104 10/30/15 1220  PHART 7.285* 7.465*  PCO2ART 60.0* 48.9*  PO2ART 46.0* 55.2*  HCO3 28.5* 34.7*  TCO2 30 36.2  O2SAT 75.0 88.0    CBC  Recent Labs Lab 10/29/15 2319 10/30/15 0445 10/30/15 2035  HGB 18.0* 17.8* 17.5*  HCT 54.5* 54.2* 53.4*  WBC 8.8 11.8* 11.7*  PLT 190 217 171    COAGULATION  Recent Labs Lab 10/29/15 2320 10/31/15 0817  INR 8.97* 1.58*    CARDIAC   Recent Labs Lab 10/29/15 2319 10/30/15 0445 10/30/15 1122  TROPONINI 10.89* 60.25* 36.17*   No results for input(s): PROBNP in the last 168 hours.   CHEMISTRY  Recent Labs Lab 10/29/15 2319 10/30/15 0445 10/30/15 2035 10/31/15 0540 10/31/15 0817  NA 136 137 137 137  --   K 3.7 3.6 3.3* 3.2*  --   CL 91* 93* 91* 90*  --   CO2 31 31 32 34*  --  GLUCOSE 427* 261* 189* 180*  --   BUN 7 8 8 8   --   CREATININE 0.93 0.78 0.74 0.66  --   CALCIUM 9.2 9.0 9.0 8.9  --   MG  --   --   --   --  1.5*   Estimated Creatinine Clearance: 87.3 mL/min (by C-G formula based on Cr of 0.66).   LIVER  Recent Labs Lab 10/29/15 2319 10/29/15 2320 10/30/15 1222 10/31/15 0817  AST 69*  --  143*  --   ALT 20  --  33  --   ALKPHOS 111  --  109  --   BILITOT 0.9  --  2.1*  --   PROT 7.1  --  7.1  --   ALBUMIN 3.1*  --  3.1*  --   INR  --  8.97*  --  1.58*     INFECTIOUS  Recent Labs Lab 10/30/15 1222 10/30/15 1549 10/30/15 2035  LATICACIDVEN 2.6*  2.4*  --   PROCALCITON <0.10  --  <0.10     ENDOCRINE CBG (last 3)   Recent Labs  10/30/15 1217 10/30/15 1628 10/30/15 2138  GLUCAP 230* 229* 153*         IMAGING x48h  - image(s) personally visualized  -   highlighted in bold Dg Chest Port 1 View  10/31/2015  CLINICAL DATA:  Respiratory failure, acute MI, COPD, symptomatic improvement this morning. EXAM: PORTABLE CHEST 1 VIEW COMPARISON:  Portable chest x-ray of October 30, 2015 FINDINGS: The lungs remain mildly hypoinflated. The interstitial markings remain increased but have improved somewhat. There is no pneumothorax or significant pleural effusion. The cardiac silhouette is enlarged. The pulmonary vascularity is engorged but more distinct. The tracheostomy appliance tip lies approximately 1.5 cm below the inferior margin of the clavicular head. The patient has undergone previous CABG. IMPRESSION: Slight interval improvement in pulmonary edema. Stable mild hypo inflation. Electronically Signed   By: David  Martinique M.D.   On: 10/31/2015 07:29   Dg Chest Port 1 View  10/30/2015  CLINICAL DATA:  Respiratory congestion, tracheostomy EXAM: PORTABLE CHEST 1 VIEW COMPARISON:  10/29/2015 FINDINGS: Cardiomegaly again noted. Again noted bilateral alveolar opacities consistent with pulmonary edema without significant change in aeration from prior exam. Tracheostomy tube is unchanged in position. Status post CABG. IMPRESSION: Again noted cardiomegaly with diffuse bilateral pulmonary edema. Stable tracheostomy tube position. Cardiomegaly. Status post CABG. Electronically Signed   By: Lahoma Crocker M.D.   On: 10/30/2015 12:56        [name]:  23 female, with significant comorbidities CAD, COPD, sleep apnea, lupus anticoagulant, morbid obesity, diabetes, with a chronic trach. Admitted for chest pain and STEMI. Had a left heart cath on 2/22 and had stent to her RCA. With increasing oxygen requirement on 2/23. Currently on 100% trach  collar.  ASSESSMENT / PLAN: A> acute hypoxemic on chronic (s/p remote trach with underlying OSA and COPD NOS) respiratory failure  :   - improved to 80% ATC with diuresis. CXR stil wet but ? Better. Flu negative  P> -Continue on trach collar - pulse goal > 90%.  - s. She may end up being on the ventilator if she gets more hypoxemic. -check resp virus multiplex PCR - diuresis per cards - recheck lactate - track PCT - negative so agree with dc abx  Will follow along  Dr. Brand Males, M.D., [facility].C.P Pulmonary and Critical Care Medicine Staff Physician [place] [facility] Pager:  [phone], If no answer or between  15:00h - 7:00h: call [phone]  10/31/2015 10:43 AM     Anti-infectives    Start     Dose/Rate Route Frequency Ordered Stop   10/30/15 1600  levofloxacin (LEVAQUIN) IVPB 750 mg  Status:  Discontinued     750 mg 100 mL/hr over 90 Minutes Intravenous Every 24 hours 10/30/15 1431 10/31/15 0817   10/30/15 1500  vancomycin (VANCOCIN) IVPB 1000 mg/200 mL premix  Status:  Discontinued     1,000 mg 200 mL/hr over 60 Minutes Intravenous Every 12 hours 10/30/15 1431 10/31/15 0817

## 2015-10-31 NOTE — Progress Notes (Signed)
[facility] ADULT ICU REPLACEMENT PROTOCOL FOR AM LAB REPLACEMENT ONLY  The patient does apply for the [facility] Adult ICU Electrolyte Replacment Protocol based on the criteria listed below:   1. Is GFR >/= 40 ml/min? Yes.    Patient's GFR today is >60 2. Is urine output >/= 0.5 ml/kg/hr for the last 6 hours? Yes.   Patient's UOP is 0.86 ml/kg/hr 3. Is BUN < 60 mg/dL? Yes.    Patient's BUN today is 8 4. Abnormal electrolyte(s): K - 3.3 5. Ordered repletion with: PER PROTOCOL 6. If a panic level lab has been reported, has the CCM MD in charge been notified? Yes.  .   Physician:  Dr. Molli Posey 10/31/2015 3:34 AM

## 2015-10-31 NOTE — Progress Notes (Addendum)
ANTICOAGULATION CONSULT NOTE - Initial Consult  Pharmacy Consult for warfarin Indication: history of DVT/Lupus AC  Allergies  Allergen Reactions  . Penicillins Rash    Has patient had a PCN reaction causing immediate rash, facial/tongue/throat swelling, SOB or lightheadedness with hypotension: Yes Has patient had a PCN reaction causing severe rash involving mucus membranes or skin necrosis: No Has patient had a PCN reaction that required hospitalization No Has patient had a PCN reaction occurring within the last 10 years: No If all of the above answers are "NO", then may proceed with Cephalosporin use.   REACTION: rash    Patient Measurements: Height: 4\' 9"  (144.8 cm) Weight: 234 lb 9.1 oz (106.4 kg) IBW/kg (Calculated) : 38.6   Vital Signs: Temp: 98.4 F (36.9 C) (02/24 1200) Temp Source: Oral (02/24 1200) BP: 88/64 mmHg (02/24 0800) Pulse Rate: 73 (02/24 0800)  Labs:  Recent Labs  10/29/15 2319 10/29/15 2320 10/30/15 0445 10/30/15 1122 10/30/15 2035 10/31/15 0540 10/31/15 0817  HGB 18.0*  --  17.8*  --  17.5*  --   --   HCT 54.5*  --  54.2*  --  53.4*  --   --   PLT 190  --  217  --  171  --   --   LABPROT  --  69.7*  --   --   --   --  18.9*  INR  --  8.97*  --   --   --   --  1.58*  CREATININE 0.93  --  0.78  --  0.74 0.66  --   TROPONINI 10.89*  --  60.25* 36.17*  --   --   --     Estimated Creatinine Clearance: 87.3 mL/min (by C-G formula based on Cr of 0.66).   Medical History: Past Medical History  Diagnosis Date  . Lupus anticoagulant disorder (HCC)     on coumadin  . Respiratory failure ([place])     s/p tracheostomy 2002  . CAD (coronary artery disease)     s/p 2V CABG in 2007  . Diabetes mellitus ([place])   . HTN (hypertension)   . Hyperlipidemia   . Cervical cancer [facility])    Assessment: 50 yo female with hx of CAD s/p CABG x2 and stents in the past, HLD, HTN, DM II, hx of DVT on coumadin, presented with CP found to have STEMI of inferior  leads, s/p emergent CATH 10/29/15.   INR low today at 1.58, orders to resume warfarin tonight. No complications were noted with cath.  PTA dose 5mg  daily except 2.5 on sunday  Goal of Therapy:  INR 2-3 Monitor platelets by anticoagulation protocol: Yes   Plan:  Warfarin 5mg  tonight Daily INR  Erin Hearing PharmD., BCPS Clinical Pharmacist Pager [phone] 10/31/2015 1:05 PM

## 2015-11-01 DIAGNOSIS — J9621 Acute and chronic respiratory failure with hypoxia: Secondary | ICD-10-CM

## 2015-11-01 DIAGNOSIS — D6862 Lupus anticoagulant syndrome: Secondary | ICD-10-CM | POA: Diagnosis present

## 2015-11-01 LAB — GLUCOSE, CAPILLARY
GLUCOSE-CAPILLARY: 144 mg/dL — AB (ref 65–99)
GLUCOSE-CAPILLARY: 227 mg/dL — AB (ref 65–99)
Glucose-Capillary: 266 mg/dL — ABNORMAL HIGH (ref 65–99)
Glucose-Capillary: 171 mg/dL — ABNORMAL HIGH (ref 65–99)

## 2015-11-01 LAB — HEMOGLOBIN A1C
HEMOGLOBIN A1C: 8.7 % — AB (ref 4.8–5.6)
MEAN PLASMA GLUCOSE: 203 mg/dL

## 2015-11-01 LAB — BASIC METABOLIC PANEL
ANION GAP: 13 (ref 5–15)
BUN: 12 mg/dL (ref 6–20)
CALCIUM: 9.4 mg/dL (ref 8.9–10.3)
CO2: 32 mmol/L (ref 22–32)
CREATININE: 0.8 mg/dL (ref 0.44–1.00)
Chloride: 97 mmol/L — ABNORMAL LOW (ref 101–111)
GFR calc Af Amer: >60 mL/min (ref >60)
GFR calc non Af Amer: >60 mL/min (ref >60)
Glucose, Bld: 136 mg/dL — ABNORMAL HIGH (ref 65–99)
Potassium: 3.5 mmol/L (ref 3.5–5.1)
Sodium: 142 mmol/L (ref 135–145)

## 2015-11-01 LAB — CK TOTAL AND CKMB (NOT AT ARMC)
CK TOTAL: 144 U/L (ref 38–234)
CK, MB: 5.7 ng/mL — AB (ref 0.5–5.0)
Relative Index: 4 — ABNORMAL HIGH (ref 0.0–2.5)

## 2015-11-01 LAB — CBC
HEMATOCRIT: 55.4 % — AB (ref 36.0–46.0)
Hemoglobin: 18.5 g/dL — ABNORMAL HIGH (ref 12.0–15.0)
MCH: 32.6 pg (ref 26.0–34.0)
MCHC: 33.4 g/dL (ref 30.0–36.0)
MCV: 97.7 fL (ref 78.0–100.0)
PLATELETS: 196 10*3/uL (ref 150–400)
RBC: 5.67 MIL/uL — AB (ref 3.87–5.11)
RDW: 14.5 % (ref 11.5–15.5)
WBC: 9.8 10*3/uL (ref 4.0–10.5)

## 2015-11-01 LAB — PHOSPHORUS: PHOSPHORUS: 3.6 mg/dL (ref 2.5–4.6)

## 2015-11-01 LAB — PROTIME-INR
INR: 1.38 (ref 0.00–1.49)
Prothrombin Time: 17.1 seconds — ABNORMAL HIGH (ref 11.6–15.2)

## 2015-11-01 LAB — MAGNESIUM: Magnesium: 2 mg/dL (ref 1.7–2.4)

## 2015-11-01 LAB — LACTIC ACID, PLASMA: Lactic Acid, Venous: 1.9 mmol/L (ref 0.5–2.0)

## 2015-11-01 LAB — PROCALCITONIN: Procalcitonin: <0.1 ng/mL

## 2015-11-01 MED ORDER — IPRATROPIUM-ALBUTEROL 0.5-2.5 (3) MG/3ML IN SOLN: 3.0000 mL | Freq: Four times a day (QID) | RESPIRATORY_TRACT | Status: DC | PRN

## 2015-11-01 MED ORDER — FUROSEMIDE 40 MG PO TABS
40.0000 mg | ORAL_TABLET | Freq: Every day | ORAL | Status: DC
  Administered 2015-11-02 – 2015-11-03 (×2): 40 mg via ORAL
  Filled 2015-11-01 (×2): qty 1

## 2015-11-01 MED ORDER — POTASSIUM CHLORIDE CRYS ER 20 MEQ PO TBCR
40.0000 meq | EXTENDED_RELEASE_TABLET | Freq: Once | ORAL | Status: AC
  Administered 2015-11-01: 40 meq via ORAL
  Filled 2015-11-01: qty 2

## 2015-11-01 MED ORDER — CLOPIDOGREL BISULFATE 300 MG PO TABS
300.0000 mg | ORAL_TABLET | Freq: Once | ORAL | Status: AC
  Administered 2015-11-01: 300 mg via ORAL
  Filled 2015-11-01: qty 1

## 2015-11-01 MED ORDER — WARFARIN SODIUM 5 MG PO TABS: 5.0000 mg | ORAL_TABLET | Freq: Once | ORAL | Status: DC

## 2015-11-01 MED ORDER — METFORMIN HCL 500 MG PO TABS
500.0000 mg | ORAL_TABLET | Freq: Two times a day (BID) | ORAL | Status: DC
  Administered 2015-11-01 – 2015-11-03 (×4): 500 mg via ORAL
  Filled 2015-11-01 (×4): qty 1

## 2015-11-01 MED ORDER — IPRATROPIUM-ALBUTEROL 0.5-2.5 (3) MG/3ML IN SOLN
3.0000 mL | Freq: Three times a day (TID) | RESPIRATORY_TRACT | Status: DC
  Filled 2015-11-01: qty 3

## 2015-11-01 MED ORDER — CLOPIDOGREL BISULFATE 75 MG PO TABS
75.0000 mg | ORAL_TABLET | Freq: Every day | ORAL | Status: DC
  Administered 2015-11-02 – 2015-11-03 (×2): 75 mg via ORAL
  Filled 2015-11-01 (×2): qty 1

## 2015-11-01 MED ORDER — APIXABAN 2.5 MG PO TABS
2.5000 mg | ORAL_TABLET | Freq: Two times a day (BID) | ORAL | Status: DC
  Administered 2015-11-01 – 2015-11-03 (×5): 2.5 mg via ORAL
  Filled 2015-11-01 (×5): qty 1

## 2015-11-01 NOTE — Progress Notes (Addendum)
ANTICOAGULATION CONSULT NOTE - [place] for warfarin --> apixaban Indication: history of DVT/Lupus AC  Allergies  Allergen Reactions  . Penicillins Rash    Has patient had a PCN reaction causing immediate rash, facial/tongue/throat swelling, SOB or lightheadedness with hypotension: Yes Has patient had a PCN reaction causing severe rash involving mucus membranes or skin necrosis: No Has patient had a PCN reaction that required hospitalization No Has patient had a PCN reaction occurring within the last 10 years: No If all of the above answers are "NO", then may proceed with Cephalosporin use.   REACTION: rash    Patient Measurements: Height: 4\' 9"  (144.8 cm) Weight: 234 lb 9.1 oz (106.4 kg) IBW/kg (Calculated) : 38.6   Vital Signs: Temp: 98 F (36.7 C) (02/25 0838) Temp Source: Oral (02/25 0838) BP: 105/66 mmHg (02/25 0838) Pulse Rate: 77 (02/25 0838)  Labs:  Recent Labs  10/29/15 2319 10/29/15 2320 10/30/15 0445 10/30/15 1122 10/30/15 2035 10/31/15 0540 10/31/15 0817 11/01/15 0228  HGB 18.0*  --  17.8*  --  17.5*  --   --  18.5*  HCT 54.5*  --  54.2*  --  53.4*  --   --  55.4*  PLT 190  --  217  --  171  --   --  196  LABPROT  --  69.7*  --   --   --   --  18.9* 17.1*  INR  --  8.97*  --   --   --   --  1.58* 1.38  CREATININE 0.93  --  0.78  --  0.74 0.66  --  0.80  CKTOTAL  --   --   --   --   --   --   --  144  CKMB  --   --   --   --   --   --   --  5.7*  TROPONINI 10.89*  --  60.25* 36.17*  --   --   --   --     Estimated Creatinine Clearance: 87.3 mL/min (by C-G formula based on Cr of 0.8).   Medical History: Past Medical History  Diagnosis Date  . Lupus anticoagulant disorder (HCC)     on coumadin  . Respiratory failure ([place])     s/p tracheostomy 2002  . CAD (coronary artery disease)     s/p 2V CABG in 2007  . Diabetes mellitus ([place])   . HTN (hypertension)   . Hyperlipidemia   . Cervical cancer [facility])     Assessment: 50 yo female with hx of CAD s/p CABG x2 and stents in the past, HLD, HTN, DM II, lupus AC, hx of DVT on Coumadin, presented with CP found to have STEMI of inferior leads, s/p emergent CATH 10/29/15.   INR 1.38, Hgb 18.5, Plt 196.   PTA dose: 5 mg daily except 2.5 mg on Sunday  Goal of Therapy:  INR 2-3 Monitor platelets by anticoagulation protocol: Yes   Plan:  -Warfarin 5 mg tonight -Daily INR -CBC q72h  Governor Specking, PharmD Clinical Pharmacy Resident Pager: [phone]  11/01/2015 9:20 AM  ADDENDUM ----------------------------------------------------------------------------------  -Given pt's history of DVT with Lupus anticoagulant (last DVT ~15 years ago) and recent STEMI, pt clearly needs both anti-coagulation and anti-platelet therapy.  - Cardiology physicians feel that Plavix would be better option for this patient, so Brilinta will be changed to Plavix. - Current anticoagulation with warfarin was discussed with hematology physicians, who feel  apixaban 2.5 mg BID could be used based on AMPLIFY-EXT data, even with presence of Lupus AC  Plan: - switch from Brilinta/ASA/warfarin to Plavix/ASA/apixaban, then drop ASA in 3 months - consider checking anti-Xa level after 3-5 doses of apixaban - CBC q72h - Patient education  Governor Specking, PharmD Clinical Pharmacy Resident Pager: [phone]

## 2015-11-01 NOTE — Progress Notes (Signed)
SUBJECTIVE:   Diuresed over 3L yesterday. Feeling better today. Breathing is better. No chest pain or any other complaint.  BP 105/66 mmHg  Pulse 77  Temp(Src) 98 F (36.7 C) (Oral)  Resp 18  Ht 4\' 9"  (1.448 m)  Wt 106.4 kg (234 lb 9.1 oz)  BMI 50.75 kg/m2  SpO2 90%  Intake/Output Summary (Last 24 hours) at 11/01/15 1010 Last data filed at 11/01/15 0900  Gross per 24 hour  Intake    860 ml  Output   2800 ml  Net  -1940 ml    PHYSICAL EXAM General: obese female, pleasant, NAD Psych:  Good affect, responds appropriately Neck: No JVD. No masses noted. Has trach collar on.  Lungs: decreased throughout no wheezing Heart: RRR with no murmurs noted. Abdomen: obese  Bowel sounds are present. Soft, non-tender.  Extremities: No lower extremity edema.   LABS: Basic Metabolic Panel:  Recent Labs  10/31/15 0540 10/31/15 0817 11/01/15 0228  NA 137  --  142  K 3.2*  --  3.5  CL 90*  --  97*  CO2 34*  --  32  GLUCOSE 180*  --  136*  BUN 8  --  12  CREATININE 0.66  --  0.80  CALCIUM 8.9  --  9.4  MG  --  1.5* 2.0  PHOS  --   --  3.6   CBC:  Recent Labs  10/30/15 2035 11/01/15 0228  WBC 11.7* 9.8  HGB 17.5* 18.5*  HCT 53.4* 55.4*  MCV 96.6 97.7  PLT 171 196   Cardiac Enzymes:  Recent Labs  10/29/15 2319 10/30/15 0445 10/30/15 1122 11/01/15 0228  CKTOTAL  --   --   --  144  CKMB  --   --   --  5.7*  TROPONINI 10.89* 60.25* 36.17*  --    Fasting Lipid Panel:  Recent Labs  10/30/15 0445  CHOL 216*  HDL 54  LDLCALC 134*  TRIG 141  CHOLHDL 4.0    Current Meds: . antiseptic oral rinse  7 mL Mouth Rinse q12n4p  . arformoterol  15 mcg Nebulization BID  . aspirin  81 mg Oral Daily  . atorvastatin  80 mg Oral q1800  . budesonide (PULMICORT) nebulizer solution  0.5 mg Nebulization BID  . chlorhexidine  15 mL Mouth Rinse BID  . furosemide  40 mg Intravenous Q12H  . gabapentin  100 mg Oral QHS  . insulin aspart  0-15 Units Subcutaneous TID WC    . ipratropium-albuterol  3 mL Nebulization TID  . lisinopril  20 mg Oral Daily  . metoprolol tartrate  50 mg Oral BID  . potassium chloride  40 mEq Oral Daily  . sodium chloride flush  3 mL Intravenous Q12H  . ticagrelor  90 mg Oral BID  . warfarin  5 mg Oral ONCE-1800  . Warfarin - Pharmacist Dosing Inpatient   Does not apply q1800     ASSESSMENT AND PLAN:  Active Problems:   Acute ST elevation myocardial infarction (STEMI) involving right coronary artery (HCC)   Tobacco abuse   ST elevation myocardial infarction (STEMI) of inferior wall (HCC)   Respiratory failure (HCC)   Coronary artery disease involving coronary bypass graft of native heart without angina pectoris   OSA (obstructive sleep apnea)   COPD exacerbation (HCC)   Chronic diastolic CHF (congestive heart failure) ([place])   50 yo female with hx of CAD s/p CABG x2 and stents in the past,  HLD, HTN, DM II, hx of DVT on coumadin, presented with CP found to have STEMI of inferior leads, s/p emergent CATH 10/29/15  Cath summary:   1. Acute inferior STEMI secondary to sub-total occlusion anastomosis of SVG to PDA 2. CAD s/p 2 V CABG with patent SVG to PDA (with thrombus) and patent LIMA to LAD 3. Chronic total occlusion proximal LAD 4. Patent stent proximal Circumflex 5. Chronic total occlusion proximal RCA 6. Successful PTCA/DES x 1 distal body of SVG to PDA  Assessment/Plan:  Acute inferior wall STEMI - s/p emergent LHC and DES to SVG to PDA anastomosis. 10/29/15 ECHO done showed preserved EF 50-55%, septal hypokinesis, restrictive cardiomyopathy based on echo. Mild MR. Increased PA pressure of 40. -I discussed with Dr. Burt Knack and hematology. We all feel that she needs aggressive anti-platelet therapy AND anti-coagulation in the setting of her acute STEMI and thrombophilia with lupus AC. Dr. Burt Knack and I feel that Kary Kos is relatively contraindicated with [facility] and that Plavix would be better agent. We discussed using asa 81,  plavix and full-dose AC with coumadin or a NOAC for 3 months and then dropping ASA. I also spoke to hematology who felt that based on Amplify-EXT data we could use Apixaban 2.5 bid for ongoing prevention of DVT even with Lupus AC. (last DVT about 15 years ago) - Plan for now will be to switch brilinta to plavix (will give 300 mg tonight) and use asa 81, plavix 75 and apixaban 2.5 bid. Consider checking anti-Xa level after 3-5 doses. - cont metoprolol 50 mg bid (has been tolerating at home), lipitor 80mg , lisinopril 20mg  daily  Acute hypoxic resp failure - likely 2/2 to diastolic CHF and COPD with long standing trach colalr  - appreciate pulm recs: continue pulmicort + brovana along with duoneb q6hr - since PCT negative and patient is afebrile and without any other signs of infection now off levaquin + vanc ( got 1 dose of each).  - volume status much improved. Can switch to po lasix - she has severe polycythemia indicative of chronic hypoxia despite trach collar. Currently using [place] at night but will likely need BIPAP  Hypokalemia - replete PRN.   HTN - improved cont metoprolol and lisinopril.  DVT/Lupus anticoagulant - see discussion above  DM II - Can restart metformin now >48 hours after cath  Tobacco abuse- 1/2 PPD current smoker. I counseled on smoking cessation and encouraged her to continue this discussion with her PCP. \  Can got SDU. Continue CR.  Clarece Drzewiecki,MD 10:10 AM

## 2015-11-01 NOTE — Progress Notes (Signed)
Name: Theresa [name] MRN: [id number] DOB: Jun 30, 1966    ADMISSION DATE:  10/29/2015 CONSULTATION DATE:  10/30/15  REFERRING MD :  Dr. Angelena Form  CHIEF COMPLAINT:  Admitted for STEMI.   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 EKG with inferior ST elevation. Code STEMI activated by ED staff and patient transported to [facility] for emergent cardiac cath.    Patient was admitted for 3 months for respiratory failure in 2002. This was in [place]. She ended up having a trach after that. She has a #6 metal trach since 2002. She was following with an ENT over at [facility] but has not seen him in years. She takes care for trach. Has had no issues with trach.  Positive for sleep apnea. Has COPD.  Chronic hypoxemia. Uses 4 L oxygen at bedtime. Uses oxygen 2-3 L when necessary.  History of blood clot in the left leg related to her 2002 hospitalization. She has been on Coumadin since that time. No issues with bleeding.   SIGNIFICANT EVENTS  10/29/15 admitted for STEMI. Had LHC and RCA stent 10/30/15 worsening resp status. On 100% FiO2 via TC. PCCM consulted.  10/31/15 - echo yesterday PASP 40, LVEF 55%, Diast dysfn +.. TROP yest VEY HIGH. Improved fio2 needs to 80% ATC   SUBJECTIVE/OVERNIGHT/INTERVAL HX 11/01/15 - improved and down to 40% ATC per RN. RN also states desats temp last night to 80% while on 40% ATC (on 4L [place] at home at night for OSA)   VITAL SIGNS: Temp:  [97.9 F (36.6 C)-98.7 F (37.1 C)] 98 F (36.7 C) (02/25 0838) Pulse Rate:  [66-83] 77 (02/25 0838) Resp:  [16-18] 18 (02/25 0838) BP: (102-109)/(66-75) 105/66 mmHg (02/25 0838) SpO2:  [86  %-96 %] 90 % (02/25 0838) FiO2 (%):  [40 %-60 %] 40 % (02/25 0800)  PHYSICAL EXAMINATION: General: comfortable obese,. Looks better. Sitting in chair. Neuro:  CNl nerves grossly intact. No lateralizing sign. Talking. Moves all 4s. Oriented x 3. GCS 15.  HEENT:  Pupils equal reactive bilaterally. No neck vein distention. No lymphadenopathy. Has a metal trach, #6 in place. Grossly looks clean. No purulent discharge seen. Cardiovascular:  Good S1-S2. No S3 murmur rub or gallop. Lungs:  Fair air entry. Diminished breath sounds at the bases. Bibasilar crackles - much improved Abdomen:  Positive bowel sounds, soft. Obese nontender no masses palpated. Musculoskeletal:  Grade12 edema. No clubbing cyanosis. Skin:  Warm and dry.   PULMONARY  Recent Labs Lab 10/29/15 2104 10/30/15 1220  PHART 7.285* 7.465*  PCO2ART 60.0* 48.9*  PO2ART 46.0* 55.2*  HCO3 28.5* 34.7*  TCO2 30 36.2  O2SAT 75.0 88.0    CBC  Recent Labs Lab 10/30/15 0445 10/30/15 2035 11/01/15 0228  HGB 17.8* 17.5* 18.5*  HCT 54.2* 53.4* 55.4*  WBC 11.8* 11.7* 9.8  PLT 217 171 196    COAGULATION  Recent Labs Lab 10/29/15 2320 10/31/15 0817 11/01/15 0228  INR 8.97* 1.58* 1.38    CARDIAC    Recent Labs Lab 10/29/15 2319 10/30/15 0445 10/30/15 1122  TROPONINI 10.89* 60.25* 36.17*   No results for input(s): PROBNP in the last 168 hours.   CHEMISTRY  Recent  Labs Lab 10/29/15 2319 10/30/15 0445 10/30/15 2035 10/31/15 0540 10/31/15 0817 11/01/15 0228  NA 136 137 137 137  --  142  K 3.7 3.6 3.3* 3.2*  --  3.5  CL 91* 93* 91* 90*  --  97*  CO2 31 31 32 34*  --  32  GLUCOSE 427* 261* 189* 180*  --  136*  BUN 7 8 8 8   --  12  CREATININE 0.93 0.78 0.74 0.66  --  0.80  CALCIUM 9.2 9.0 9.0 8.9  --  9.4  MG  --   --   --   --  1.5* 2.0  PHOS  --   --   --   --   --  3.6   Estimated Creatinine Clearance: 87.3 mL/min (by C-G formula based on Cr of 0.8).   LIVER  Recent Labs Lab 10/29/15 2319  10/29/15 2320 10/30/15 1222 10/31/15 0817 11/01/15 0228  AST 69*  --  143*  --   --   ALT 20  --  33  --   --   ALKPHOS 111  --  109  --   --   BILITOT 0.9  --  2.1*  --   --   PROT 7.1  --  7.1  --   --   ALBUMIN 3.1*  --  3.1*  --   --   INR  --  8.97*  --  1.58* 1.38     INFECTIOUS  Recent Labs Lab 10/30/15 1222 10/30/15 1549 10/30/15 2035 11/01/15 0015 11/01/15 0228  LATICACIDVEN 2.6* 2.4*  --  1.9  --   PROCALCITON <0.10  --  <0.10  --  <0.10     ENDOCRINE CBG (last 3)   Recent Labs  10/31/15 1734 10/31/15 2122 11/01/15 0834  GLUCAP 277* 175* 144*         IMAGING x48h  - image(s) personally visualized  -   highlighted in bold Dg Chest Port 1 View  10/31/2015  CLINICAL DATA:  Respiratory failure, acute MI, COPD, symptomatic improvement this morning. EXAM: PORTABLE CHEST 1 VIEW COMPARISON:  Portable chest x-ray of October 30, 2015 FINDINGS: The lungs remain mildly hypoinflated. The interstitial markings remain increased but have improved somewhat. There is no pneumothorax or significant pleural effusion. The cardiac silhouette is enlarged. The pulmonary vascularity is engorged but more distinct. The tracheostomy appliance tip lies approximately 1.5 cm below the inferior margin of the clavicular head. The patient has undergone previous CABG. IMPRESSION: Slight interval improvement in pulmonary edema. Stable mild hypo inflation. Electronically Signed   By: David  Martinique M.D.   On: 10/31/2015 07:29   Dg Chest Port 1 View  10/30/2015  CLINICAL DATA:  Respiratory congestion, tracheostomy EXAM: PORTABLE CHEST 1 VIEW COMPARISON:  10/29/2015 FINDINGS: Cardiomegaly again noted. Again noted bilateral alveolar opacities consistent with pulmonary edema without significant change in aeration from prior exam. Tracheostomy tube is unchanged in position. Status post CABG. IMPRESSION: Again noted cardiomegaly with diffuse bilateral pulmonary edema. Stable tracheostomy tube  position. Cardiomegaly. Status post CABG. Electronically Signed   By: Lahoma Crocker M.D.   On: 10/30/2015 12:56        [name]:  50 female, with significant comorbidities CAD, COPD, sleep apnea, lupus anticoagulant, morbid obesity, diabetes, with a chronic trach. Admitted for chest pain and STEMI. Had a left heart cath on 2/22 and had stent to her RCA. With increasing oxygen requirement on 2/23. Currently on 100% trach collar.  ASSESSMENT /  PLAN: A> acute hypoxemic on chronic (s/p remote trach with underlying OSA and COPD NOS) respiratory failure  :   - improved to 40% ATC with diuresis. . Flu negative. RVP multiplex pcr nasal swab pending rom 10/31/15. Lactate and PCT normal   P> -Continue on trach collar - pulse goal > 90%.  -await resp virus multiplex PCR - diuresis per cards; recommend aggressive strategy for now -needs opd pulm/sleep  eval   Patient and RN updated at bedside  PCCM will see again 11/03/15 Monday. Call if needed   Dr. Brand Males, M.D., [facility].C.P Pulmonary and Critical Care Medicine Staff Physician [place] [facility] Pager: [phone], If no answer or between  15:00h - 7:00h: call [phone]  11/01/2015 10:11 AM     Anti-infectives    Start     Dose/Rate Route Frequency Ordered Stop   10/30/15 1600  levofloxacin (LEVAQUIN) IVPB 750 mg  Status:  Discontinued     750 mg 100 mL/hr over 90 Minutes Intravenous Every 24 hours 10/30/15 1431 10/31/15 0817   10/30/15 1500  vancomycin (VANCOCIN) IVPB 1000 mg/200 mL premix  Status:  Discontinued     1,000 mg 200 mL/hr over 60 Minutes Intravenous Every 12 hours 10/30/15 1431 10/31/15 0817

## 2015-11-02 ENCOUNTER — Inpatient Hospital Stay (HOSPITAL_COMMUNITY): Payer: Medicare Other

## 2015-11-02 DIAGNOSIS — I5033 Acute on chronic diastolic (congestive) heart failure: Secondary | ICD-10-CM

## 2015-11-02 DIAGNOSIS — I2119 ST elevation (STEMI) myocardial infarction involving other coronary artery of inferior wall: Principal | ICD-10-CM

## 2015-11-02 LAB — CULTURE, GROUP A STREP (THRC)

## 2015-11-02 LAB — CULTURE, RESPIRATORY W GRAM STAIN

## 2015-11-02 LAB — CBC
HCT: 55.4 % — ABNORMAL HIGH (ref 36.0–46.0)
Hemoglobin: 18.1 g/dL — ABNORMAL HIGH (ref 12.0–15.0)
MCH: 31.5 pg (ref 26.0–34.0)
MCHC: 32.7 g/dL (ref 30.0–36.0)
MCV: 96.5 fL (ref 78.0–100.0)
Platelets: 220 10*3/uL (ref 150–400)
RBC: 5.74 MIL/uL — ABNORMAL HIGH (ref 3.87–5.11)
RDW: 14.5 % (ref 11.5–15.5)
WBC: 9.8 10*3/uL (ref 4.0–10.5)

## 2015-11-02 LAB — GLUCOSE, CAPILLARY
GLUCOSE-CAPILLARY: 224 mg/dL — AB (ref 65–99)
GLUCOSE-CAPILLARY: 260 mg/dL — AB (ref 65–99)
Glucose-Capillary: 217 mg/dL — ABNORMAL HIGH (ref 65–99)
Glucose-Capillary: 163 mg/dL — ABNORMAL HIGH (ref 65–99)

## 2015-11-02 LAB — BASIC METABOLIC PANEL
Anion gap: 14 (ref 5–15)
BUN: 16 mg/dL (ref 6–20)
CHLORIDE: 97 mmol/L — AB (ref 101–111)
CO2: 27 mmol/L (ref 22–32)
CREATININE: 1.06 mg/dL — AB (ref 0.44–1.00)
Calcium: 9.6 mg/dL (ref 8.9–10.3)
GFR calc Af Amer: >60 mL/min (ref >60)
GFR calc non Af Amer: >60 mL/min (ref >60)
Glucose, Bld: 182 mg/dL — ABNORMAL HIGH (ref 65–99)
POTASSIUM: 4.7 mmol/L (ref 3.5–5.1)
SODIUM: 138 mmol/L (ref 135–145)

## 2015-11-02 LAB — CULTURE, RESPIRATORY

## 2015-11-02 LAB — PHOSPHORUS: PHOSPHORUS: 4 mg/dL (ref 2.5–4.6)

## 2015-11-02 LAB — MAGNESIUM: MAGNESIUM: 1.9 mg/dL (ref 1.7–2.4)

## 2015-11-02 MED ORDER — GABAPENTIN 100 MG PO CAPS
100.0000 mg | ORAL_CAPSULE | Freq: Two times a day (BID) | ORAL | Status: DC
  Administered 2015-11-03: 100 mg via ORAL
  Filled 2015-11-02: qty 1

## 2015-11-02 MED ORDER — GABAPENTIN 100 MG PO CAPS
100.0000 mg | ORAL_CAPSULE | Freq: Once | ORAL | Status: AC
  Administered 2015-11-02: 100 mg via ORAL
  Filled 2015-11-02: qty 1

## 2015-11-02 MED ORDER — CIPROFLOXACIN IN D5W 400 MG/200ML IV SOLN
400.0000 mg | Freq: Two times a day (BID) | INTRAVENOUS | Status: DC
  Administered 2015-11-02 – 2015-11-03 (×2): 400 mg via INTRAVENOUS
  Filled 2015-11-02 (×2): qty 200

## 2015-11-02 NOTE — Progress Notes (Signed)
Not seen today 11/02/2015  RN called with micro report  Results for orders placed or performed during the hospital encounter of 10/29/15  MRSA PCR Screening     Status: None   Collection Time: 10/29/15 10:45 PM  Result Value Ref Range Status   MRSA by PCR NEGATIVE NEGATIVE Final    Comment:        The GeneXpert MRSA Assay (FDA approved for NASAL specimens only), is one component of a comprehensive MRSA colonization surveillance program. It is not intended to diagnose MRSA infection nor to guide or monitor treatment for MRSA infections.   Culture, respiratory (NON-Expectorated)     Status: None   Collection Time: 10/30/15 12:10 PM  Result Value Ref Range Status   Specimen Description TRACHEAL ASPIRATE  Final   Special Requests NONE  Final   Gram Stain   Final    MODERATE WBC PRESENT, PREDOMINANTLY MONONUCLEAR RARE SQUAMOUS EPITHELIAL CELLS PRESENT FEW GRAM POSITIVE COCCI IN PAIRS FEW GRAM NEGATIVE RODS Performed at [facility]    Culture   Final    MODERATE SERRATIA MARCESCENS Performed at [facility]    Report Status 11/02/2015 FINAL  Final   Organism ID, Bacteria SERRATIA MARCESCENS  Final      Susceptibility   Serratia marcescens - MIC*    CEFAZOLIN >=64 RESISTANT Resistant     CEFEPIME <=1 SENSITIVE Sensitive     CEFTAZIDIME <=1 SENSITIVE Sensitive     CEFTRIAXONE <=1 SENSITIVE Sensitive     CIPROFLOXACIN <=0.25 SENSITIVE Sensitive     GENTAMICIN <=1 SENSITIVE Sensitive     TOBRAMYCIN 2 SENSITIVE Sensitive     TRIMETH/SULFA Value in next row Sensitive      <=20 SENSITIVE(NOTE)    * MODERATE SERRATIA MARCESCENS  Culture, blood (routine x 2)     Status: None (Preliminary result)   Collection Time: 10/30/15 12:22 PM  Result Value Ref Range Status   Specimen Description BLOOD LEFT HAND  Final   Special Requests BOTTLES DRAWN AEROBIC ONLY 4CC  Final   Culture NO GROWTH 3 DAYS  Final   Report Status PENDING  Incomplete  Culture, blood (routine x  2)     Status: None (Preliminary result)   Collection Time: 10/30/15 12:29 PM  Result Value Ref Range Status   Specimen Description BLOOD RIGHT ARM  Final   Special Requests BOTTLES DRAWN AEROBIC ONLY 4CC  Final   Culture NO GROWTH 3 DAYS  Final   Report Status PENDING  Incomplete  Rapid strep screen (not at [facility])     Status: None   Collection Time: 10/30/15  1:03 PM  Result Value Ref Range Status   Streptococcus, Group A Screen (Direct) NEGATIVE NEGATIVE Final    Comment: (NOTE) A Rapid Antigen test may result negative if the antigen level in the sample is below the detection level of this test. The FDA has not cleared this test as a stand-alone test therefore the rapid antigen negative result has reflexed to a Group A Strep culture.   Culture, group A strep     Status: None   Collection Time: 10/30/15  1:03 PM  Result Value Ref Range Status   Specimen Description THROAT  Final   Special Requests NONE Reflexed from [id number]  Final   Culture NO GROUP A STREP (S.PYOGENES) ISOLATED  Final   Report Status 11/02/2015 FINAL  Final     Plan Start cefepime   Dr. Brand Males, M.D., [facility].C.P Pulmonary and Critical Care Medicine  Staff [place] [facility] Pager: [phone], If no answer or between  15:00h - 7:00h: call [phone]  11/02/2015 1:37 PM

## 2015-11-02 NOTE — Progress Notes (Signed)
SUBJECTIVE:    Breathing is better. CCM weaning O2. Legs hurting her.  Tracheal aspirate + serratia. No chest pain or any other complaint.  BP 123/96 mmHg  Pulse 66  Temp(Src) 98.3 F (36.8 C) (Oral)  Resp 18  Ht 4\' 9"  (1.448 m)  Wt 106.4 kg (234 lb 9.1 oz)  BMI 50.75 kg/m2  SpO2 92%  Intake/Output Summary (Last 24 hours) at 11/02/15 1312 Last data filed at 11/02/15 1100  Gross per 24 hour  Intake    760 ml  Output   1250 ml  Net   -490 ml    PHYSICAL EXAM General: obese female, pleasant, NAD Psych:  Good affect, responds appropriately Neck: No JVD. No masses noted. Has trach collar on.  Lungs: decreased throughout no wheezing Heart: RRR with no murmurs noted. Abdomen: obese  Bowel sounds are present. Soft, non-tender.  Extremities: No lower extremity edema.   LABS: Basic Metabolic Panel:  Recent Labs  11/01/15 0228 11/02/15 0304  NA 142 138  K 3.5 4.7  CL 97* 97*  CO2 32 27  GLUCOSE 136* 182*  BUN 12 16  CREATININE 0.80 1.06*  CALCIUM 9.4 9.6  MG 2.0 1.9  PHOS 3.6 4.0   CBC:  Recent Labs  11/01/15 0228 11/02/15 0304  WBC 9.8 9.8  HGB 18.5* 18.1*  HCT 55.4* 55.4*  MCV 97.7 96.5  PLT 196 220   Cardiac Enzymes:  Recent Labs  11/01/15 0228  CKTOTAL 144  CKMB 5.7*   Fasting Lipid Panel: No results for input(s): CHOL, HDL, LDLCALC, TRIG, CHOLHDL, LDLDIRECT in the last 72 hours.  Current Meds: . antiseptic oral rinse  7 mL Mouth Rinse q12n4p  . apixaban  2.5 mg Oral BID  . arformoterol  15 mcg Nebulization BID  . aspirin  81 mg Oral Daily  . atorvastatin  80 mg Oral q1800  . budesonide (PULMICORT) nebulizer solution  0.5 mg Nebulization BID  . chlorhexidine  15 mL Mouth Rinse BID  . clopidogrel  75 mg Oral Daily  . furosemide  40 mg Intravenous Q12H  . furosemide  40 mg Oral Daily  . gabapentin  100 mg Oral QHS  . insulin aspart  0-15 Units Subcutaneous TID WC  . lisinopril  20 mg Oral Daily  . metFORMIN  500 mg Oral BID WC  .  metoprolol tartrate  50 mg Oral BID  . potassium chloride  40 mEq Oral Daily  . sodium chloride flush  3 mL Intravenous Q12H     ASSESSMENT AND PLAN:  Active Problems:   Acute ST elevation myocardial infarction (STEMI) involving right coronary artery (HCC)   Tobacco abuse   ST elevation myocardial infarction (STEMI) of inferior wall (HCC)   Respiratory failure (HCC)   Coronary artery disease involving coronary bypass graft of native heart without angina pectoris   OSA (obstructive sleep apnea)   COPD exacerbation (HCC)   Chronic diastolic CHF (congestive heart failure) (HCC)   Lupus anticoagulant disorder ([place])   50 yo female with hx of CAD s/p CABG x2 and stents in the past, HLD, HTN, DM II, hx of DVT on coumadin, presented with CP found to have STEMI of inferior leads, s/p emergent CATH 10/29/15  Cath summary:   1. Acute inferior STEMI secondary to sub-total occlusion anastomosis of SVG to PDA 2. CAD s/p 2 V CABG with patent SVG to PDA (with thrombus) and patent LIMA to LAD 3. Chronic total occlusion proximal LAD 4. Patent  stent proximal Circumflex 5. Chronic total occlusion proximal RCA 6. Successful PTCA/DES x 1 distal body of SVG to PDA  Assessment/Plan:  Acute inferior wall STEMI - s/p emergent LHC and DES to SVG to PDA anastomosis. 10/29/15 ECHO done showed preserved EF 50-55%, septal hypokinesis, restrictive cardiomyopathy based on echo. Mild MR. Increased PA pressure of 40. -I discussed with Dr. Burt Knack and hematology. We all feel that she needs aggressive anti-platelet therapy AND anti-coagulation in the setting of her acute STEMI and thrombophilia with lupus AC. Dr. Burt Knack and I feel that Kary Kos is relatively contraindicated with [facility] and that Plavix would be better agent. We discussed using asa 81, plavix and full-dose AC with coumadin or a NOAC for 3 months and then dropping ASA. I also spoke to hematology who felt that based on [facility]-EXT data we could use Apixaban 2.5  bid for ongoing prevention of DVT even with Lupus AC. (last DVT about 15 years ago) - Plan for now will be to switch brilinta to plavix (got 300 mg last night) and use asa 81, plavix 75 and apixaban 2.5 bid. Consider checking anti-Xa level after 3-5 doses. - cont metoprolol 50 mg bid (has been tolerating at home), lipitor 80mg , lisinopril 20mg  daily  Acute hypoxic resp failure - likely 2/2 to diastolic CHF and COPD with long standing trach colalr  - appreciate pulm recs: continue pulmicort + brovana along with duoneb q6hr - since PCT negative and patient wss afebrile and without any other signs of infection now off levaquin + vanc ( got 1 dose of each).  - tracheal aspirate now + for serratia. Will ask CCM to address - volume status much improved. Continue po lasix - she has severe polycythemia indicative of chronic hypoxia despite trach collar. Currently using [place] at night but will likely need BIPAP  Hypokalemia - replete PRN.   HTN - improved cont metoprolol and lisinopril.  DVT/Lupus anticoagulant - see discussion above  DM II - Back on metformin now >48 hours after cath  Tobacco abuse- 1/2 PPD current smoker. I counseled on smoking cessation and encouraged her to continue this discussion with her PCP.   Neuropathy - increase neurontin back to home dose.    Bensimhon, Daniel,MD 1:12 PM

## 2015-11-03 ENCOUNTER — Encounter (HOSPITAL_COMMUNITY): Payer: Self-pay | Admitting: Cardiology

## 2015-11-03 DIAGNOSIS — J441 Chronic obstructive pulmonary disease with (acute) exacerbation: Secondary | ICD-10-CM

## 2015-11-03 DIAGNOSIS — I25810 Atherosclerosis of coronary artery bypass graft(s) without angina pectoris: Secondary | ICD-10-CM | POA: Diagnosis present

## 2015-11-03 DIAGNOSIS — E876 Hypokalemia: Secondary | ICD-10-CM

## 2015-11-03 DIAGNOSIS — I25700 Atherosclerosis of coronary artery bypass graft(s), unspecified, with unstable angina pectoris: Secondary | ICD-10-CM

## 2015-11-03 DIAGNOSIS — J9601 Acute respiratory failure with hypoxia: Secondary | ICD-10-CM | POA: Diagnosis present

## 2015-11-03 DIAGNOSIS — Z930 Tracheostomy status: Secondary | ICD-10-CM

## 2015-11-03 HISTORY — DX: Tracheostomy status: Z93.0

## 2015-11-03 LAB — MAGNESIUM: MAGNESIUM: 1.9 mg/dL (ref 1.7–2.4)

## 2015-11-03 LAB — BASIC METABOLIC PANEL
Anion gap: 13 (ref 5–15)
BUN: 25 mg/dL — AB (ref 6–20)
CHLORIDE: 92 mmol/L — AB (ref 101–111)
CO2: 27 mmol/L (ref 22–32)
Calcium: 9.3 mg/dL (ref 8.9–10.3)
Creatinine, Ser: 1.32 mg/dL — ABNORMAL HIGH (ref 0.44–1.00)
GFR calc Af Amer: 53 mL/min — ABNORMAL LOW (ref >60)
GFR calc non Af Amer: 46 mL/min — ABNORMAL LOW (ref >60)
GLUCOSE: 212 mg/dL — AB (ref 65–99)
POTASSIUM: 4.7 mmol/L (ref 3.5–5.1)
Sodium: 132 mmol/L — ABNORMAL LOW (ref 135–145)

## 2015-11-03 LAB — CBC
HCT: 55 % — ABNORMAL HIGH (ref 36.0–46.0)
HEMOGLOBIN: 18.1 g/dL — AB (ref 12.0–15.0)
MCH: 31.8 pg (ref 26.0–34.0)
MCHC: 32.9 g/dL (ref 30.0–36.0)
MCV: 96.5 fL (ref 78.0–100.0)
Platelets: 225 10*3/uL (ref 150–400)
RBC: 5.7 MIL/uL — AB (ref 3.87–5.11)
RDW: 14.5 % (ref 11.5–15.5)
WBC: 10.4 10*3/uL (ref 4.0–10.5)

## 2015-11-03 LAB — GLUCOSE, CAPILLARY
Glucose-Capillary: 175 mg/dL — ABNORMAL HIGH (ref 65–99)
Glucose-Capillary: 213 mg/dL — ABNORMAL HIGH (ref 65–99)

## 2015-11-03 LAB — PHOSPHORUS: Phosphorus: 3.9 mg/dL (ref 2.5–4.6)

## 2015-11-03 MED ORDER — CIPROFLOXACIN HCL 500 MG PO TABS
500.0000 mg | ORAL_TABLET | Freq: Two times a day (BID) | ORAL | Status: DC
  Administered 2015-11-03: 500 mg via ORAL
  Filled 2015-11-03 (×2): qty 1

## 2015-11-03 MED ORDER — ATORVASTATIN CALCIUM 80 MG PO TABS: 80.0000 mg | ORAL_TABLET | Freq: Every day | ORAL | Status: DC

## 2015-11-03 MED ORDER — CLOPIDOGREL BISULFATE 75 MG PO TABS: 75.0000 mg | ORAL_TABLET | Freq: Every day | ORAL | Status: DC

## 2015-11-03 MED ORDER — FUROSEMIDE 40 MG PO TABS: 40.0000 mg | ORAL_TABLET | Freq: Every day | ORAL | Status: DC

## 2015-11-03 MED ORDER — APIXABAN 2.5 MG PO TABS: 2.5000 mg | ORAL_TABLET | Freq: Two times a day (BID) | ORAL | Status: DC

## 2015-11-03 MED ORDER — CIPROFLOXACIN HCL 500 MG PO TABS: 500.0000 mg | ORAL_TABLET | Freq: Two times a day (BID) | ORAL | Status: AC

## 2015-11-03 MED ORDER — ASPIRIN 81 MG PO CHEW: 81.0000 mg | CHEWABLE_TABLET | Freq: Every day | ORAL | Status: DC

## 2015-11-03 MED ORDER — ARFORMOTEROL TARTRATE 15 MCG/2ML IN NEBU: 15.0000 ug | INHALATION_SOLUTION | Freq: Two times a day (BID) | RESPIRATORY_TRACT | Status: DC

## 2015-11-03 MED ORDER — IPRATROPIUM-ALBUTEROL 0.5-2.5 (3) MG/3ML IN SOLN: 3.0000 mL | Freq: Four times a day (QID) | RESPIRATORY_TRACT | Status: DC | PRN

## 2015-11-03 MED ORDER — BUDESONIDE 0.5 MG/2ML IN SUSP: 0.5000 mg | Freq: Two times a day (BID) | RESPIRATORY_TRACT | Status: DC

## 2015-11-03 MED ORDER — ACETAMINOPHEN 325 MG PO TABS: 650.0000 mg | ORAL_TABLET | ORAL | Status: DC | PRN

## 2015-11-03 NOTE — Progress Notes (Signed)
Inpatient Diabetes Program Recommendations  AACE/ADA: New Consensus Statement on Inpatient Glycemic Control (2015)  Target Ranges:  Prepandial:   less than 140 mg/dL      Peak postprandial:   less than 180 mg/dL (1-2 hours)      Critically ill patients:  140 - 180 mg/dL  Results for SAORI, YOUN (MRN [id number]) as of 11/03/2015 11:57  Ref. Range 11/02/2015 07:40 11/02/2015 12:47 11/02/2015 17:01 11/02/2015 21:54 11/03/2015 08:17  Glucose-Capillary Latest Ref Range: 65-99 mg/dL 217 (H) 224 (H) 260 (H) 163 (H) 175 (H)   Results for VODA, ELSAESSER (MRN [id number]) as of 11/03/2015 11:57  Ref. Range 10/31/2015 08:25  Hemoglobin A1C Latest Ref Range: 4.8-5.6 % 8.7 (H)   Review of Glycemic Control  Diabetes history: DM2 Outpatient Diabetes medications: Metformin 500 mg BID Current orders for Inpatient glycemic control: Novolog 0-15 units TID with meals, Metformin 500 mg BID  Inpatient Diabetes Program Recommendations: HgbA1C: A1C 8.7% on 10/31/15 indicating an average glucose of 203 mg/dl. Therefore, Metformin as prescribed as an outpatient is not adequate for glycemic control. A1C target goal should be 7% or less. Please have patient follow up with PCP regarding glycemic control.  NOTE: In reviewing the chart, noted patient will likely be discharged today. Please have patient follow up with PCP regarding glycemic control.  Thanks, Barnie Alderman, RN, MSN, CDE Diabetes Coordinator Inpatient Diabetes Program [phone] (Team Pager from [place] to [place]) [phone] ([facility] office) [phone] [facility] office) [phone] [facility] office)

## 2015-11-03 NOTE — Care Management Note (Signed)
Case Management Note  Patient Details  Name: Theresa [name] MRN: [id number] Date of Birth: Feb 19, 1966  Subjective/Objective:                    Action/Plan: Met with patient to discuss discharge planning. Patient will be discharging home with [facility] services and has chosen [facility]. Tiffany with [facility] was notified and has accepted the referral for discharge home today.  Patient's preferred contact number is [phone].  Patient will be discharging home on Eliquis and was provided with the 30 day free card.  Bedside RN is aware that plans are in place for discharge.  Expected Discharge Date:                  Expected Discharge Plan:  [place]  In-House Referral:     Discharge planning Services  CM Consult, Medication Assistance  Post Acute Care Choice:  Home Health Choice offered to:  Patient  DME Arranged:    DME Agency:     HH Arranged:  RN [place] Agency:  [place]  Status of Service:  Completed, signed off  Medicare Important Message Given:    Date Medicare IM Given:    Medicare IM give by:    Date Additional Medicare IM Given:    Additional Medicare Important Message give by:     If discussed at [place] of Stay Meetings, dates discussed:    Additional Comments:  Rolm Baptise, RN 11/03/2015, 3:47 PM [phone]

## 2015-11-03 NOTE — Progress Notes (Signed)
Name: Theresa [name] MRN: [id number] DOB: October 27, 1965    ADMISSION DATE:  10/29/2015 CONSULTATION DATE:  10/30/15  REFERRING MD :  Dr. Angelena Form  CHIEF COMPLAINT:  Admitted for STEMI.   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 EKG with inferior ST elevation. Code STEMI activated by ED staff and patient transported to [facility] for emergent cardiac cath.    Patient was admitted for 3 months for respiratory failure in 2002. This was in [place]. She ended up having a trach after that. She has a #6 metal trach since 2002. She was following with an ENT over at [facility] but has not seen him in years. She takes care for trach. Has had no issues with trach.  Positive for sleep apnea. Has COPD.  Chronic hypoxemia. Uses 4 L oxygen at bedtime. Uses oxygen 2-3 L when necessary.  History of blood clot in the left leg related to her 2002 hospitalization. She has been on Coumadin since that time. No issues with bleeding.   SIGNIFICANT EVENTS  10/29/15 admitted for STEMI. Had LHC and RCA stent 10/30/15 worsening resp status. On 100% FiO2 via TC. PCCM consulted.  2/27- back to [place] O2, no distress, ABX started over weekend  SUBJECTIVE:  10/31/15 - echo yesterday PASP 40, LVEF 55%, Diast dysfn +.. TROP yest VEY HIGH. Improved fio2 needs to 80% ATC 2/27- improved O2 needs  VITAL SIGNS: Temp:  [97.9 F (36.6 C)-98.8 F (37.1 C)] 97.9 F (36.6 C) (02/27 1206) Pulse Rate:  [66-86] 74 (02/27 1128) Resp:  [14-16] 14 (02/27 1128) BP: (92-123)/(50-96) 110/84 mmHg (02/27 1128) SpO2:  [91 %-98 %] 98 % (02/27 1128) FiO2 (%):  [35 %] 35 % (02/27 1128)  PHYSICAL  EXAMINATION: General:  No respiratory distress. Otherwise comfortable. Neuro:  Nonfocal, alert HEENT:  Trach metal clean  Cardiovascular:  Good S1-S2. No S3 murmur rub or gallop. Lungs:CTA Abdomen:  Positive bowel sounds, soft. Obese nontender no masses palpated. Musculoskeletal:  Edema chronic 1  Skin:  Warm and dry.   PULMONARY  Recent Labs Lab 10/29/15 2104 10/30/15 1220  PHART 7.285* 7.465*  PCO2ART 60.0* 48.9*  PO2ART 46.0* 55.2*  HCO3 28.5* 34.7*  TCO2 30 36.2  O2SAT 75.0 88.0    CBC  Recent Labs Lab 11/01/15 0228 11/02/15 0304 11/03/15 0242  HGB 18.5* 18.1* 18.1*  HCT 55.4* 55.4* 55.0*  WBC 9.8 9.8 10.4  PLT 196 220 225    COAGULATION  Recent Labs Lab 10/29/15 2320 10/31/15 0817 11/01/15 0228  INR 8.97* 1.58* 1.38    CARDIAC    Recent Labs Lab 10/29/15 2319 10/30/15 0445 10/30/15 1122  TROPONINI 10.89* 60.25* 36.17*   No results for input(s): PROBNP in the last 168 hours.   CHEMISTRY  Recent Labs Lab 10/30/15 2035 10/31/15 0540 10/31/15 0817 11/01/15 0228 11/02/15 0304 11/03/15 0242  NA 137 137  --  142 138 132*  K 3.3* 3.2*  --  3.5 4.7 4.7  CL 91* 90*  --  97* 97* 92*  CO2 32 34*  --  32 27 27  GLUCOSE 189* 180*  --  136* 182* 212*  BUN 8 8  --  12  16 25*  CREATININE 0.74 0.66  --  0.80 1.06* 1.32*  CALCIUM 9.0 8.9  --  9.4 9.6 9.3  MG  --   --  1.5* 2.0 1.9 1.9  PHOS  --   --   --  3.6 4.0 3.9   Estimated Creatinine Clearance: 52.9 mL/min (by C-G formula based on Cr of 1.32).   LIVER  Recent Labs Lab 10/29/15 2319 10/29/15 2320 10/30/15 1222 10/31/15 0817 11/01/15 0228  AST 69*  --  143*  --   --   ALT 20  --  33  --   --   ALKPHOS 111  --  109  --   --   BILITOT 0.9  --  2.1*  --   --   PROT 7.1  --  7.1  --   --   ALBUMIN 3.1*  --  3.1*  --   --   INR  --  8.97*  --  1.58* 1.38     INFECTIOUS  Recent Labs Lab 10/30/15 1222 10/30/15 1549 10/30/15 2035 11/01/15 0015 11/01/15 0228  LATICACIDVEN  2.6* 2.4*  --  1.9  --   PROCALCITON <0.10  --  <0.10  --  <0.10     ENDOCRINE CBG (last 3)   Recent Labs  11/02/15 1701 11/02/15 2154 11/03/15 0817  GLUCAP 260* 163* 175*         IMAGING x48h  - image(s) personally visualized  -   highlighted in bold Dg Chest Port 1 View  11/02/2015  CLINICAL DATA:  Pulmonary edema EXAM: PORTABLE CHEST 1 VIEW COMPARISON:  10/31/2015 FINDINGS: Cardiomegaly. Prior CABG. Tracheostomy tube is unchanged. There is diffuse bilateral airspace opacities, likely edema. Low lung volumes. Suspect small effusions. No acute bony abnormality. IMPRESSION: Continued moderate CHF pattern. Suspect small layering effusions. No real change. Electronically Signed   By: Rolm Baptise M.D.   On: 11/02/2015 07:18        [name]:  89 female, with significant comorbidities CAD, COPD, sleep apnea, lupus anticoagulant, morbid obesity, diabetes, with a chronic trach. Admitted for chest pain and STEMI. Had a left heart cath on 2/22 and had stent to her RCA. With increasing oxygen requirement on 2/23. Currently on 100% trach collar.  ASSESSMENT / PLAN: A> acute hypoxemic on chronic (s/p remote trach with underlying OSA and COPD NOS) respiratory failure  :   - improved to neg cannula o2 - rising crt R/o PNA P> -consider holding lasix, see crt trend -no fevers, PCT neg x 3 - with lasix and neg balance, still have residual int infiltrates, with moderate seraitia would be inclined to treat that organisms for 8 days quinolone x 8 days - she has Oxygen at home and normally ambulates, would have to check sats with ambulation today and if she does not desat and is supported by O2 she can go hoe on oral abx -no role repeat pcxr in house, would do as outpt with pulm follow up  Lavon Paganini. Titus Mould, MD, [address] Pgr: [facility]

## 2015-11-03 NOTE — Discharge Summary (Signed)
Physician Discharge Summary       Patient ID: Theresa [name] MRN: [id number] DOB/AGE: 1966/01/02 50 y.o.  Admit date: 10/29/2015 Discharge date: 11/03/2015 Primary Cardiologist:Dr. Ronne Binning  New Dr. Jacinta Shoe   Discharge Diagnoses:  Principal Problem:   Acute ST elevation myocardial infarction (STEMI) involving right coronary artery [facility]) Active Problems:   CAD (coronary artery disease) of artery bypass graft: PTCA/DES to distal body VG to PDA 10/29/15   DM (diabetes mellitus) ([place])   Hyperlipidemia LDL goal <70   CAD, NATIVE VESSEL   Tobacco abuse   ST elevation myocardial infarction (STEMI) of inferior wall (HCC)   Respiratory failure (HCC)   OSA (obstructive sleep apnea)   COPD exacerbation (HCC)   Chronic diastolic CHF (congestive heart failure) (HCC)   Lupus anticoagulant disorder (HCC)   Acute respiratory failure with hypoxemia ([place])   Tracheostomy in place [facility]), chronic since 2002   Hypokalemia   Discharged Condition: good  Procedures: 10/29/15 emergent cardiac cath for STEMI by Dr. Angelena Form  10/29/15 Successful PTCA/DES x 1 distal body of SVG to PDA by Dr. 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 EKG with inferior ST elevation. Code STEMI activated by ED staff and patient transported to [facility] for emergent cardiac cath. She has been followed in the past in our [place] office by Dr. Ron Parker but has not been seen in our office since 2009. There are no records of prior caths or her CABG procedure.  She has chronic trache #6 metal since 2002 after a prolonged intubation.    She came emergently to the cath lab and underwent cath and then  successful PTCA?DES to VG-PDA. Post procedure with acute hypoxemic respiratory failure secondary to possible PNA and STEMI, OSA and acute exacerbation of COPD. Nebulizers were started and diuresis. Diuresisn helped with her sats.   Her EF on echo 50-55%.    Dr. Haroldine Laws disused with Dr. Burt Knack and hematology "We all feel that she needs aggressive anti-platelet therapy AND anti-coagulation in the setting of her acute STEMI and thrombophilia with lupus AC. Dr. Burt Knack and I feel that Kary Kos is relatively contraindicated with [facility] and that Plavix would be better agent. We discussed using asa 81, plavix and full-dose AC with coumadin or a NOAC for 3 months and then dropping ASA. I also spoke to hematology who felt that based on Am[facility]-EXT data we could use Apixaban 2.5 bid for ongoing prevention of DVT even with Lupus AC. (last DVT about 15 years ago)"  Her brilinta was stopped and she was loaded with plavix.     Tobacco cessation was discussed with pt.    Per Pulmonology she will need outpt pum./sleep eval.   She ambulated with cardiac rehab and was able to walk back and forth in room SP02 was 89-90%.  She does have home oxygen but usually uses at night.  Will have home health come check her with trach care and oxygen and I have continued her in hospital nebulizers.  She was seen by Dr. Marlou Porch and found stable for discharge.  Also seen by CCM and will need outpt visit.  She will follow up in [facility] 11/11/15.  She has been  referred to [facility] at [place].  She will complete cipro after 13 more doses.   Pt's Cr was trending upward at discharge, we asked her to hold her lasix on the 2/28 and 3/1 and resume on 3/2.  She may need BMP as outpt.  appt made for pulmonary as well.  She will see her PCP back next week for diabetes management.   Consults: cardiology and pulmonary/intensive care  Significant Diagnostic Studies:  BMP Latest Ref Rng 11/03/2015 11/02/2015 11/01/2015  Glucose 65 - 99 mg/dL 212(H) 182(H)  136(H)  BUN 6 - 20 mg/dL 25(H) 16 12  Creatinine 0.44 - 1.00 mg/dL 1.32(H) 1.06(H) 0.80  Sodium 135 - 145 mmol/L 132(L) 138 142  Potassium 3.5 - 5.1 mmol/L 4.7 4.7 3.5  Chloride 101 - 111 mmol/L 92(L) 97(L) 97(L)  CO2 22 - 32 mmol/L 27 27 32  Calcium 8.9 - 10.3 mg/dL 9.3 9.6 9.4   CBC Latest Ref Rng 11/03/2015 11/02/2015 11/01/2015  WBC 4.0 - 10.5 K/uL 10.4 9.8 9.8  Hemoglobin 12.0 - 15.0 g/dL 18.1(H) 18.1(H) 18.5(H)  Hematocrit 36.0 - 46.0 % 55.0(H) 55.4(H) 55.4(H)  Platelets 150 - 400 K/uL 225 220 196   Lipid Panel     Component Value Date/Time   CHOL 216* 10/30/2015 0445   TRIG 141 10/30/2015 0445   HDL 54 10/30/2015 0445   CHOLHDL 4.0 10/30/2015 0445   VLDL 28 10/30/2015 0445   LDLCALC 134* 10/30/2015 0445   Hepatic Function Latest Ref Rng 10/30/2015 10/29/2015 05/20/2015  Total Protein 6.5 - 8.1 g/dL 7.1 7.1 7.0  Albumin 3.5 - 5.0 g/dL 3.1(L) 3.1(L) 3.5  AST 15 - 41 U/L 143(H) 69(H) 21  ALT 14 - 54 U/L 33 20 18  Alk Phosphatase 38 - 126 U/L 109 111 93  Total Bilirubin 0.3 - 1.2 mg/dL 2.1(H) 0.9 1.2  Bilirubin, Direct 0.1 - 0.5 mg/dL 0.4 - -    Troponin peak 60.25 decreasing prior to discharge 36.17  Magnesium 1.9  Lactic acid 2.6-->2.4-->1.9  hgb A1C 8.7  Legionella neg Rapid strep screen  Negative  Culture group A no group A  Influenza A B H1N1 all negative  BNP    Component Value Date/Time   BNP 737.9* 10/30/2015 1222    PORTABLE CHEST 1 VIEW  10/30/15  COMPARISON: 10/29/2015  FINDINGS: Cardiomegaly again noted. Again noted bilateral alveolar opacities consistent with pulmonary edema without significant change in aeration from prior exam. Tracheostomy tube is unchanged in position. Status post CABG.  IMPRESSION: Again noted cardiomegaly with diffuse bilateral pulmonary edema. Stable tracheostomy tube position. Cardiomegaly. Status post CABG.   PORTABLE CHEST 1 VIEW 11/02/15  COMPARISON: 10/31/2015  FINDINGS: Cardiomegaly. Prior CABG.  Tracheostomy tube is unchanged. There is diffuse bilateral airspace opacities, likely edema. Low lung volumes. Suspect small effusions. No acute bony abnormality.  IMPRESSION: Continued moderate CHF pattern. Suspect small layering effusions. No real change.       CARDIAC CATH:  Prox Cx to Mid Cx lesion, 10% stenosed. The lesion was previously treated with a stent (unknown type) greater than two years ago.  2nd Mrg lesion, 99% stenosed.  3rd Mrg lesion, 99% stenosed.  Ost Cx lesion, 30% stenosed.  Ost LAD to Mid LAD lesion, 100% stenosed. The lesion was previously treated with a stent (unknown type) greater than two years ago.  Ost RCA lesion, 100% stenosed.  SVG was injected is normal in caliber.  There is severe disease in the graft.  Ost RPDA lesion, 99% stenosed.  LIMA  was injected is normal in caliber, and is anatomically normal.  Ost 1st Mrg to 1st Mrg lesion, 100% stenosed.  Insertion lesion, 99% stenosed. Post intervention, there is a 0% residual stenosis.  Ost RPDA to RPDA lesion, 99% stenosed. Post intervention, there is a 0% residual stenosis.  1. Acute inferior STEMI secondary to sub-total occlusion anastomosis of SVG to PDA 2. CAD s/p 2 V CABG with patent SVG to PDA (with thrombus) and patent LIMA to LAD 3. Chronic total occlusion proximal LAD 4. Patent stent proximal Circumflex 5. Chronic total occlusion proximal RCA 6. Successful PTCA/DES x 1 distal body of SVG to PDA  ECHO: Study Conclusions - Left ventricle: The cavity size was normal. Wall thickness was increased in a pattern of mild LVH. Systolic function was normal. The estimated ejection fraction was in the range of 50% to 55%. There is hypokinesis of the anteroseptal myocardium. Doppler parameters are consistent with restrictive physiology, indicative of decreased left ventricular diastolic compliance and/or increased left atrial pressure. - Mitral valve: Calcified annulus.  There was mild regurgitation. - Left atrium: The atrium was moderately dilated. - Pulmonary arteries: Systolic pressure was mildly increased. PA peak pressure: 40 mm Hg (S). Impressions:  - Extremely limited; definity used; septal hypokinesis with overall low normal LV function; restrictive filling; mild MR and TR; mildly elevated pulmonary pressure.   Discharge Exam: Blood pressure 110/84, pulse 74, temperature 97.9 F (36.6 C), temperature source Oral, resp. rate 16, height 4' 9"  (1.448 m), weight 234 lb 9.1 oz (106.4 kg), SpO2 95 %.  Disposition: 01-Home or Self Care      Discharge Instructions    Amb Referral to Cardiac Rehabilitation    Complete by:  As directed   Diagnosis:   Myocardial Infarction PCI              Medication List    STOP taking these medications        pravastatin 40 MG tablet  Commonly known as:  PRAVACHOL     warfarin 5 MG tablet  Commonly known as:  COUMADIN      TAKE these medications        acetaminophen 325 MG tablet  Commonly known as:  TYLENOL  Take 2 tablets (650 mg total) by mouth every 4 (four) hours as needed for headache or mild pain.     apixaban 2.5 MG Tabs tablet  Commonly known as:  ELIQUIS  Take 1 tablet (2.5 mg total) by mouth 2 (two) times daily.     arformoterol 15 MCG/2ML Nebu  Commonly known as:  BROVANA  Take 2 mLs (15 mcg total) by nebulization 2 (two) times daily.     aspirin 81 MG chewable tablet  Chew 1 tablet (81 mg total) by mouth daily.     atorvastatin 80 MG tablet  Commonly known as:  LIPITOR  Take 1 tablet (80 mg total) by mouth daily at 6 PM.     budesonide 0.5 MG/2ML nebulizer solution  Commonly known as:  PULMICORT  Take 2 mLs (0.5 mg total) by nebulization 2 (two) times daily.     ciprofloxacin 500 MG tablet  Commonly known as:  CIPRO  Take 1 tablet (500 mg total) by mouth 2 (two) times daily.     clopidogrel 75 MG tablet  Commonly known as:  PLAVIX  Take 1 tablet (75 mg total) by  mouth daily.     diazepam 5 MG tablet  Commonly known as:  VALIUM  Take 5 mg  by mouth at bedtime. Take every night per patient     furosemide 40 MG tablet  Commonly known as:  LASIX  Take 1 tablet (40 mg total) by mouth daily.  Start taking on:  11/06/2015     gabapentin 100 MG capsule  Commonly known as:  NEURONTIN  Take 100 mg by mouth 2 (two) times daily.     GAS-X 80 MG chewable tablet  Generic drug:  simethicone  Chew 80 mg by mouth every 6 (six) hours as needed for flatulence.     ipratropium-albuterol 0.5-2.5 (3) MG/3ML Soln  Commonly known as:  DUONEB  Take 3 mLs by nebulization every 6 (six) hours as needed.     lisinopril 20 MG tablet  Commonly known as:  PRINIVIL,ZESTRIL  Take 20 mg by mouth daily.     metFORMIN 500 MG tablet  Commonly known as:  GLUCOPHAGE  Take 500 mg by mouth 2 (two) times daily with a meal.     metoprolol 50 MG tablet  Commonly known as:  LOPRESSOR  Take 50 mg by mouth 2 (two) times daily.     nitroGLYCERIN 0.4 MG SL tablet  Commonly known as:  NITROSTAT  Place 0.4 mg under the tongue every 5 (five) minutes as needed for chest pain.     ondansetron 4 MG disintegrating tablet  Commonly known as:  ZOFRAN ODT  Take 1 tablet (4 mg total) by mouth every 8 (eight) hours as needed for nausea or vomiting.     oxyCODONE 5 MG immediate release tablet  Commonly known as:  ROXICODONE  Take 1 tablet (5 mg total) by mouth every 6 (six) hours as needed for severe pain.       Follow-up Information    Follow up with Herminio Commons, MD On 11/11/2015.   Specialty:  Cardiology   Why:  at 4:20 pm in place of Dr. Letta Kocher information:   [place] [address] [phone]       Follow up with [place].   Contact information:   [address] [place] [address] [phone]       Follow up with Noe Gens, NP On 11/17/2015.   Specialty:  Pulmonary Disease   Why:  at 9 AM the nurse  practitioner for Pulmonology.   Contact information:   [place] [address] [phone]        Discharge Instructions: No coumadin.  You are now on Eliquis.  Do not stop also continue Plavix and ASA for your stent.  Take 1 NTG, under your tongue, while sitting.  If no relief of pain may repeat NTG, one tab every 5 minutes up to 3 tablets total over 15 minutes.  If no relief CALL 911.  If you have dizziness/lightheadness  while taking NTG, stop taking and call 911.        Call the [place] office if any questions.    Heart Healthy Diabetic diet.  See your PCP-Dr. Manuella Ghazi in [place] for your diabetes  You should use your home oxygen if you are up and about for long stretches. During the day as well as at night.     Hold your furosemide for 11/04/15 and 11/05/15 then resume 40 mg daily on 11/06/15.  We stopped your pravachol and placed you on lipitor for better control. Signed: Isaiah Serge Nurse Practitioner-Certified [facility]: [facility] 11/03/2015, 4:10 PM  Time spent on discharge : >  30 minutes.    Personally seen and examined. Agree with above. Acute inferior wall ST elevation myocardial infarction  - drug-eluting stent SVG to PDA anastomosis on 10/29/15 - Echo EF 50-55% with septal hypokinesis - PA pressure 40 - Previous lengthy discussions about triple therapy given her lupus anticoagulant. Conversations between Dr. Burt Knack, pharmacy resulted in plan to switch from [place] to Plavix, load received, aspirin 81 mg, Plavix 75 mg and Eliquis 2.5 mg twice a day.  - Metoprolol 50 mg twice a day, has been tolerating at home, Lipitor 80 mg, lisinopril 20 mg  Acute hypoxic respiratory failure - In part secondary to diastolic heart failure with COPD, long-standing trach collar Serratia marcescens - MIC*     CEFAZOLIN >=64 RESISTANT Resistant     CEFEPIME <=1 SENSITIVE Sensitive     CEFTAZIDIME <=1 SENSITIVE Sensitive      CEFTRIAXONE <=1 SENSITIVE Sensitive     CIPROFLOXACIN <=0.25 SENSITIVE Sensitive     GENTAMICIN <=1 SENSITIVE Sensitive     TOBRAMYCIN 2 SENSITIVE Sensitive     TRIMETH/SULFA Value in next row Sensitive      <=20 SENSITIVE(NOTE)   * MODERATE SERRATIA MARCESCENS       - Currently Cipro on medication list. (not using cefepime because of potential allergy)     Continue Cipro by mouth for 10 days. Status seems improved. By mouth Lasix.  - noted severe polycythemia indicative of chronic hypoxia despite trach collar. Currently using nasal cannula at night, may need BiPAP. HTN - improved cont metoprolol and lisinopril.  DVT/Lupus anticoagulant - see discussion above  DM II - Back on metformin now >48 hours after cath  Tobacco abuse- 1/2 PPD current smoker. Counseled on smoking cessation and encouraged her to continue this discussion with her PCP.   Neuropathy - increase neurontin back to home dose.   Candee Furbish, MD

## 2015-11-03 NOTE — Progress Notes (Signed)
Cardiologist: Former Haematologist followed in [place]:  Previously legs were hurting, no chest pain, breathing back to baseline.   Critical care on board - appreciate recs. Cipro.  Objective:  Vital Signs in the last 24 hours: Temp:  [98 F (36.7 C)-98.5 F (36.9 C)] 98 F (36.7 C) (02/27 0349) Pulse Rate:  [66-86] 66 (02/27 0739) Resp:  [14-18] 14 (02/27 0739) BP: (92-123)/(50-96) 92/50 mmHg (02/27 0739) SpO2:  [91 %-96 %] 92 % (02/27 0739) FiO2 (%):  [35 %] 35 % (02/27 0739)  Intake/Output from previous day: 02/26 0701 - 02/27 0700 In: 35 [P.O.:960; IV Piggyback:200] Out: 1300 [Urine:1300]   Physical Exam: General: Well developed, well nourished, in no acute distress. Head:  Normocephalic and atraumatic. Trach.  Lungs: Clear to auscultation and percussion. Heart: Normal S1 and S2.  No murmur, rubs or gallops.  Abdomen: soft, non-tender, positive bowel sounds. Obese Extremities: No clubbing or cyanosis. No edema. Neurologic: Alert and oriented x 3.    Lab Results:  Recent Labs  11/02/15 0304 11/03/15 0242  WBC 9.8 10.4  HGB 18.1* 18.1*  PLT 220 225    Recent Labs  11/02/15 0304 11/03/15 0242  NA 138 132*  K 4.7 4.7  CL 97* 92*  CO2 27 27  GLUCOSE 182* 212*  BUN 16 25*  CREATININE 1.06* 1.32*    Imaging: Dg Chest Port 1 View  11/02/2015  CLINICAL DATA:  Pulmonary edema EXAM: PORTABLE CHEST 1 VIEW COMPARISON:  10/31/2015 FINDINGS: Cardiomegaly. Prior CABG. Tracheostomy tube is unchanged. There is diffuse bilateral airspace opacities, likely edema. Low lung volumes. Suspect small effusions. No acute bony abnormality. IMPRESSION: Continued moderate CHF pattern. Suspect small layering effusions. No real change. Electronically Signed   By: Rolm Baptise M.D.   On: 11/02/2015 07:18   Personally viewed.   Telemetry: No adverse arrhythmias Personally viewed.   EKG:  No new EKG Personally viewed.  Cardiac Studies:   Cath summary:   1. Acute inferior  STEMI secondary to sub-total occlusion anastomosis of SVG to PDA 2. CAD s/p 2 V CABG with patent SVG to PDA (with thrombus) and patent LIMA to LAD 3. Chronic total occlusion proximal LAD 4. Patent stent proximal Circumflex 5. Chronic total occlusion proximal RCA 6. Successful PTCA/DES x 1 distal body of SVG to PDA   Meds: Scheduled Meds: . antiseptic oral rinse  7 mL Mouth Rinse q12n4p  . apixaban  2.5 mg Oral BID  . arformoterol  15 mcg Nebulization BID  . aspirin  81 mg Oral Daily  . atorvastatin  80 mg Oral q1800  . budesonide (PULMICORT) nebulizer solution  0.5 mg Nebulization BID  . chlorhexidine  15 mL Mouth Rinse BID  . ciprofloxacin  400 mg Intravenous Q12H  . clopidogrel  75 mg Oral Daily  . furosemide  40 mg Intravenous Q12H  . furosemide  40 mg Oral Daily  . gabapentin  100 mg Oral QHS  . gabapentin  100 mg Oral BID  . insulin aspart  0-15 Units Subcutaneous TID WC  . lisinopril  20 mg Oral Daily  . metFORMIN  500 mg Oral BID WC  . metoprolol tartrate  50 mg Oral BID  . sodium chloride flush  3 mL Intravenous Q12H   Continuous Infusions:  PRN Meds:.sodium chloride, acetaminophen, ipratropium-albuterol, morphine injection, ondansetron (ZOFRAN) IV, oxyCODONE-acetaminophen, sodium chloride flush  Assessment/Plan:  Active Problems:   Acute ST elevation myocardial infarction (STEMI) involving right coronary artery (HCC)   Tobacco abuse  ST elevation myocardial infarction (STEMI) of inferior wall (HCC)   Respiratory failure (HCC)   Coronary artery disease involving coronary bypass graft of native heart without angina pectoris   OSA (obstructive sleep apnea)   COPD exacerbation (HCC)   Chronic diastolic CHF (congestive heart failure) (HCC)   Lupus anticoagulant disorder (HCC)  Acute inferior wall ST elevation myocardial infarction   - drug-eluting stent SVG to PDA anastomosis on 10/29/15  - Echo EF 50-55% with septal hypokinesis  - PA pressure 40  - Previous  lengthy discussions about triple therapy given her lupus anticoagulant. Conversations between Dr. Burt Knack, pharmacy resulted in plan to switch from [place] to Plavix, load received, aspirin 81 mg, Plavix 75 mg and Eliquis 2.5 mg twice a day.   - Consider checking anti-10 A level after 3-5 doses  - Metoprolol 50 mg twice a day, has been tolerating at home, Lipitor 80 mg, lisinopril 20 mg  Acute hypoxic respiratory failure  - In part secondary to diastolic heart failure with COPD, long-standing trach collar  - Pulmonary medicine following  - Nebulizers noted  - Note reviewed from Dr. Chase Caller on 11/02/15 which states plan start cefepime  -  Serratia marcescens - MIC*     CEFAZOLIN >=64 RESISTANT Resistant     CEFEPIME <=1 SENSITIVE Sensitive     CEFTAZIDIME <=1 SENSITIVE Sensitive     CEFTRIAXONE <=1 SENSITIVE Sensitive     CIPROFLOXACIN <=0.25 SENSITIVE Sensitive     GENTAMICIN <=1 SENSITIVE Sensitive     TOBRAMYCIN 2 SENSITIVE Sensitive     TRIMETH/SULFA Value in next row Sensitive      <=20 SENSITIVE(NOTE)   * MODERATE SERRATIA MARCESCENS       - Currently Cipro on medication list. (not using cefepime because of potential allergy)  - Will switch to PO Cipro. Continue for 10 days.      Status seems improved. By mouth Lasix.   - noted severe polycythemia indicative of chronic hypoxia despite trach collar. Currently using nasal cannula at night, may need BiPAP.  Hypokalemia - replete PRN.   HTN - improved cont metoprolol and lisinopril.  DVT/Lupus anticoagulant - see discussion above  DM II - Back on metformin now >48 hours after cath  Tobacco abuse- 1/2 PPD current smoker. Counseled on smoking cessation and encouraged her to continue this discussion with her PCP.   Neuropathy - increase neurontin back to home dose.   Will plan on potential DC later today. Have cardiac rehab see her. She seemed excited about going home.       SKAINS, Callery 11/03/2015, 8:11 AM

## 2015-11-03 NOTE — Discharge Instructions (Addendum)
Information on my medicine - ELIQUIS (apixaban)  This medication education was reviewed with me or my healthcare representative as part of my discharge preparation.  The pharmacist that spoke with me during my hospital stay was:  Georgina Peer, [facility]  Why was Eliquis prescribed for you? Eliquis was prescribed to prevent the formation of blood clots due to your increased risk of blood clots.  What do You need to know about Eliquis ? Take ONE 2.5 mg tablet taken TWICE daily.  Eliquis may be taken with or without food.   Try to take the dose about the same time in the morning and in the evening. If you have difficulty swallowing the tablet whole please discuss with your pharmacist how to take the medication safely.  Take Eliquis exactly as prescribed and DO NOT stop taking Eliquis without talking to the doctor who prescribed the medication.  Stopping may increase your risk of developing a new blood clot.  Refill your prescription before you run out.  After discharge, you should have regular check-up appointments with your healthcare provider that is prescribing your Eliquis.    What do you do if you miss a dose? If a dose of ELIQUIS is not taken at the scheduled time, take it as soon as possible on the same day and twice-daily administration should be resumed. The dose should not be doubled to make up for a missed dose.  Important Safety Information A possible side effect of Eliquis is bleeding. You should call your healthcare provider right away if you experience any of the following: ? Bleeding from an injury or your nose that does not stop. ? Unusual colored urine (red or dark brown) or unusual colored stools (red or black). ? Unusual bruising for unknown reasons. ? A serious fall or if you hit your head (even if there is no bleeding).  Some medicines may interact with Eliquis and might increase your risk of bleeding or clotting while on Eliquis. To help avoid this, consult  your healthcare provider or pharmacist prior to using any new prescription or non-prescription medications, including herbals, vitamins, non-steroidal anti-inflammatory drugs (NSAIDs) and supplements.  This website has more information on Eliquis (apixaban): http://www.eliquis.com/eliquis/home  No coumadin.  You are now on Eliquis.  Do not stop also continue Plavix and ASA for your stent.  Take 1 NTG, under your tongue, while sitting.  If no relief of pain may repeat NTG, one tab every 5 minutes up to 3 tablets total over 15 minutes.  If no relief CALL 911.  If you have dizziness/lightheadness  while taking NTG, stop taking and call 911.        Call the [place] office if any questions.    Heart Healthy Diabetic diet.  See your PCP-Dr. Manuella Ghazi in [place] for your diabetes  You should use your home oxygen if you are up and about for long stretches. During the day as well as at night.     Hold your furosemide for 11/04/15 and 11/05/15 then resume 40 mg daily on 11/06/15.  We stopped your pravachol and placed you on lipitor for better control.

## 2015-11-03 NOTE — Progress Notes (Signed)
CARDIAC REHAB PHASE I   PRE:  Rate/Rhythm: 65 SR    BP: sitting 108/83    SaO2: 91 trach collar asleep, 93 RA EOB  MODE:  Ambulation: 150-200 ft (in room)   POST:  Rate/Rhythm: 76 SR    BP: sitting 109/79     SaO2: 86-89 RA  Pt able to take O2 off and walk back and forth in room numerous times. [place] 89-90 RA. She did begin to drop toward end, I saw 86-87 RA briefly. Pt rested and it came back up to 90 RA. Pt has O2 set up at home although she does not use it during the day. Encouraged pt to use it to feel better and have more support although she could go short periods of time without it. Discussed MI, stent, restrictions, smoking cessation (pt thinking about quitting), diet, ex, CRPII, NTG, and plavix. Voiced understanding. Reinforced the importance of not missing meds and prioritizing her med needs. Will send referral to [facility]. Pt would benefit from [facility] to access her O2 and trach care (she would like a refresher "make sure I am doing it right").  [place], ACSM 11/03/2015 2:50 PM

## 2015-11-04 DIAGNOSIS — Z720 Tobacco use: Secondary | ICD-10-CM | POA: Diagnosis not present

## 2015-11-04 DIAGNOSIS — J9601 Acute respiratory failure with hypoxia: Secondary | ICD-10-CM | POA: Diagnosis not present

## 2015-11-04 DIAGNOSIS — I2119 ST elevation (STEMI) myocardial infarction involving other coronary artery of inferior wall: Secondary | ICD-10-CM | POA: Diagnosis not present

## 2015-11-04 DIAGNOSIS — I25709 Atherosclerosis of coronary artery bypass graft(s), unspecified, with unspecified angina pectoris: Secondary | ICD-10-CM | POA: Diagnosis not present

## 2015-11-04 DIAGNOSIS — I5032 Chronic diastolic (congestive) heart failure: Secondary | ICD-10-CM | POA: Diagnosis not present

## 2015-11-04 DIAGNOSIS — Z9981 Dependence on supplemental oxygen: Secondary | ICD-10-CM | POA: Diagnosis not present

## 2015-11-04 DIAGNOSIS — E119 Type 2 diabetes mellitus without complications: Secondary | ICD-10-CM | POA: Diagnosis not present

## 2015-11-04 DIAGNOSIS — Z430 Encounter for attention to tracheostomy: Secondary | ICD-10-CM | POA: Diagnosis not present

## 2015-11-04 DIAGNOSIS — J441 Chronic obstructive pulmonary disease with (acute) exacerbation: Secondary | ICD-10-CM | POA: Diagnosis not present

## 2015-11-04 DIAGNOSIS — E785 Hyperlipidemia, unspecified: Secondary | ICD-10-CM | POA: Diagnosis not present

## 2015-11-04 DIAGNOSIS — M329 Systemic lupus erythematosus, unspecified: Secondary | ICD-10-CM | POA: Diagnosis not present

## 2015-11-04 LAB — RESPIRATORY VIRUS PANEL
ADENOVIRUS: NEGATIVE
Influenza A: NEGATIVE
Influenza B: NEGATIVE
Metapneumovirus: NEGATIVE
Parainfluenza 1: NEGATIVE
Parainfluenza 2: NEGATIVE
Parainfluenza 3: NEGATIVE
RESPIRATORY SYNCYTIAL VIRUS A: NEGATIVE
RESPIRATORY SYNCYTIAL VIRUS B: NEGATIVE
RHINOVIRUS: NEGATIVE

## 2015-11-04 LAB — CULTURE, BLOOD (ROUTINE X 2)
CULTURE: NO GROWTH
Culture: NO GROWTH

## 2015-11-05 ENCOUNTER — Telehealth: Payer: Self-pay | Admitting: *Deleted

## 2015-11-05 MED ORDER — ONDANSETRON 4 MG PO TBDP: 4.0000 mg | ORAL_TABLET | Freq: Three times a day (TID) | ORAL | Status: DC | PRN

## 2015-11-05 MED ORDER — LISINOPRIL 20 MG PO TABS
20.0000 mg | ORAL_TABLET | Freq: Every day | ORAL | Status: DC
Start: 2015-11-05 — End: 2016-04-13

## 2015-11-05 NOTE — Telephone Encounter (Signed)
Pt was d/c'd from [facility] 10/29/15 and was started on new medication (lisinopril and Zofran), which was not sent in to pharmacy, Vaughan Basta from [facility] requesting [place] to send in until f/u appt with Dr. Dwana Curd. Will forward if ok to send to pharmacy.

## 2015-11-05 NOTE — Telephone Encounter (Signed)
Medication sent to pharmacy  

## 2015-11-05 NOTE — Telephone Encounter (Signed)
That would be fine 

## 2015-11-06 DIAGNOSIS — J9601 Acute respiratory failure with hypoxia: Secondary | ICD-10-CM | POA: Diagnosis not present

## 2015-11-06 DIAGNOSIS — I5032 Chronic diastolic (congestive) heart failure: Secondary | ICD-10-CM | POA: Diagnosis not present

## 2015-11-06 DIAGNOSIS — I25709 Atherosclerosis of coronary artery bypass graft(s), unspecified, with unspecified angina pectoris: Secondary | ICD-10-CM | POA: Diagnosis not present

## 2015-11-06 DIAGNOSIS — I2119 ST elevation (STEMI) myocardial infarction involving other coronary artery of inferior wall: Secondary | ICD-10-CM | POA: Diagnosis not present

## 2015-11-06 DIAGNOSIS — E119 Type 2 diabetes mellitus without complications: Secondary | ICD-10-CM | POA: Diagnosis not present

## 2015-11-06 DIAGNOSIS — J441 Chronic obstructive pulmonary disease with (acute) exacerbation: Secondary | ICD-10-CM | POA: Diagnosis not present

## 2015-11-11 ENCOUNTER — Ambulatory Visit (INDEPENDENT_AMBULATORY_CARE_PROVIDER_SITE_OTHER): Payer: Medicare Other | Admitting: Cardiovascular Disease

## 2015-11-11 ENCOUNTER — Encounter: Payer: Self-pay | Admitting: Cardiovascular Disease

## 2015-11-11 VITALS — BP 118/88 | HR 66 | Ht <= 58 in | Wt 227.0 lb

## 2015-11-11 DIAGNOSIS — I10 Essential (primary) hypertension: Secondary | ICD-10-CM | POA: Diagnosis not present

## 2015-11-11 DIAGNOSIS — D6862 Lupus anticoagulant syndrome: Secondary | ICD-10-CM | POA: Diagnosis not present

## 2015-11-11 DIAGNOSIS — I82409 Acute embolism and thrombosis of unspecified deep veins of unspecified lower extremity: Secondary | ICD-10-CM | POA: Diagnosis not present

## 2015-11-11 DIAGNOSIS — Z716 Tobacco abuse counseling: Secondary | ICD-10-CM

## 2015-11-11 DIAGNOSIS — I2510 Atherosclerotic heart disease of native coronary artery without angina pectoris: Secondary | ICD-10-CM | POA: Diagnosis not present

## 2015-11-11 DIAGNOSIS — N179 Acute kidney failure, unspecified: Secondary | ICD-10-CM | POA: Diagnosis not present

## 2015-11-11 DIAGNOSIS — I25768 Atherosclerosis of bypass graft of coronary artery of transplanted heart with other forms of angina pectoris: Secondary | ICD-10-CM

## 2015-11-11 DIAGNOSIS — M159 Polyosteoarthritis, unspecified: Secondary | ICD-10-CM | POA: Diagnosis not present

## 2015-11-11 DIAGNOSIS — I2111 ST elevation (STEMI) myocardial infarction involving right coronary artery: Secondary | ICD-10-CM | POA: Diagnosis not present

## 2015-11-11 DIAGNOSIS — E119 Type 2 diabetes mellitus without complications: Secondary | ICD-10-CM | POA: Diagnosis not present

## 2015-11-11 DIAGNOSIS — J449 Chronic obstructive pulmonary disease, unspecified: Secondary | ICD-10-CM | POA: Diagnosis not present

## 2015-11-11 DIAGNOSIS — Z299 Encounter for prophylactic measures, unspecified: Secondary | ICD-10-CM | POA: Diagnosis not present

## 2015-11-11 NOTE — Progress Notes (Signed)
Patient ID: Theresa [name], female   DOB: [birth year], 50 y.o.   MRN: [id number]      SUBJECTIVE: The patient is a 50 year old woman who I am meeting for the first time. She was hospitalized for any acute inferior STEMI in February and underwent drug-eluting stent placement in the distal SVG to the PDA. She has a history of two-vessel CABG with stenting prior to her CABG performed in [place] with CABG performed in [place] ([place] at [facility] in 2007. She also has a history of diabetes, hypertension, hyperlipidemia, Lupus anticoagulant with DVT and is on chronic anticoagulant therapy. She underwent tracheostomy in 2002 for acute hypoxic respiratory failure.   Ultimately it was decided that she would need aggressive antiplatelet therapy and anticoagulation due to acute STEMI and thrombophilia with lupus anticoagulant. It was felt she could be on aspirin 81 mg , Plavix, and Eliquis with aspirin to be discontinued after 3 months of therapy.  Echocardiogram demonstrated normal left ventricular systolic function, EF [id number] , with restrictive physiology.  She has COPD and continues to smoke. She has cut back from one pack of cigarettes daily to a half pack daily.  She denies exertional chest pain. She said she feels weak and tired.   Labs on 2/27 showed BUN 25, creatinine 1.32. Hemoglobin on 2/26 was 18.1.   Review of Systems: As per "subjective", otherwise negative.    Current Outpatient Prescriptions  Medication Sig Dispense Refill  . acetaminophen (TYLENOL) 325 MG tablet Take 2 tablets (650 mg total) by mouth every 4 (four) hours as needed for headache or mild pain.    Marland Kitchen apixaban (ELIQUIS) 2.5 MG TABS tablet Take 1 tablet (2.5 mg total) by mouth 2 (two) times daily. 60 tablet 6  . aspirin 81 MG chewable tablet Chew 1 tablet (81 mg total) by mouth daily.    Marland Kitchen atorvastatin (LIPITOR) 80 MG tablet Take 1 tablet (80 mg total) by mouth daily at 6 PM. 30 tablet 6  . budesonide  (PULMICORT) 0.5 MG/2ML nebulizer solution Take 2 mLs (0.5 mg total) by nebulization 2 (two) times daily. 200 mL 12  . clopidogrel (PLAVIX) 75 MG tablet Take 1 tablet (75 mg total) by mouth daily. 30 tablet 11  . diazepam (VALIUM) 5 MG tablet Take 5 mg by mouth at bedtime. Take every night per patient    . furosemide (LASIX) 40 MG tablet Take 1 tablet (40 mg total) by mouth daily. 30 tablet 6  . gabapentin (NEURONTIN) 100 MG capsule Take 300 mg by mouth 2 (two) times daily.     Marland Kitchen ipratropium-albuterol (DUONEB) 0.5-2.5 (3) MG/3ML SOLN Take 3 mLs by nebulization every 6 (six) hours as needed. 360 mL 3  . lisinopril (PRINIVIL,ZESTRIL) 20 MG tablet Take 1 tablet (20 mg total) by mouth daily. 30 tablet 3  . metFORMIN (GLUCOPHAGE) 500 MG tablet Take 500 mg by mouth 2 (two) times daily with a meal.    . metoprolol (LOPRESSOR) 50 MG tablet Take 50 mg by mouth 2 (two) times daily.    . nitroGLYCERIN (NITROSTAT) 0.4 MG SL tablet Place 0.4 mg under the tongue every 5 (five) minutes as needed for chest pain.    . simethicone (GAS-X) 80 MG chewable tablet Chew 80 mg by mouth every 6 (six) hours as needed for flatulence.     No current facility-administered medications for this visit.    Past Medical History  Diagnosis Date  . Lupus anticoagulant disorder (HCC)     on  coumadin  . Respiratory failure ([place])     s/p tracheostomy 2002  . CAD (coronary artery disease)     s/p 2V CABG in 2007  . Diabetes mellitus ([place])   . HTN (hypertension)   . Hyperlipidemia   . Cervical cancer ([place])   . CAD (coronary artery disease) of artery bypass graft 11/03/2015  . Tracheostomy in place [facility]), chronic since 2002 11/03/2015  . ST elevation (STEMI) myocardial infarction involving right coronary artery ([place]) 10/29/15    stent to VG to PDA    Past Surgical History  Procedure Laterality Date  . Coronary artery bypass graft  2007    2V  . Carotid stent    . Tracheostomy    . Cesarean section with bilateral tubal  ligation    . Cardiac catheterization N/A 10/29/2015    Procedure: Left Heart Cath and Cors/Grafts Angiography;  Surgeon: Burnell Blanks, MD;  Location: [place] [facility];  Service: Cardiovascular;  Laterality: N/A;  . Cardiac catheterization  10/29/2015    Procedure: Coronary Stent Intervention;  Surgeon: Burnell Blanks, MD;  Location: [place] [facility];  Service: Cardiovascular;;    Social History   Social History  . Marital Status: Married    Spouse Name: N/A  . Number of Children: N/A  . Years of Education: N/A   Occupational History  . Not on file.   Social History Main Topics  . Smoking status: Current Every Day Smoker -- 1.00 packs/day    Types: Cigarettes  . Smokeless tobacco: Never Used  . Alcohol Use: No  . Drug Use: No  . Sexual Activity: Not on file   Other Topics Concern  . Not on file   Social History Narrative     Filed Vitals:   11/11/15 1620  BP: 118/88  Pulse: 66  Height: 4\' 9"  (1.448 m)  Weight: 227 lb (102.967 kg)    PHYSICAL EXAM General: NAD HEENT: Normal. Neck: No JVD, tracheostomy. Lungs: Clear to auscultation bilaterally with normal respiratory effort. CV: Nondisplaced PMI.  Regular rate and rhythm, normal S1/S2, no S3/S4, no murmur. Trivial dorsal pedal edema.   Abdomen: Obese.  Neurologic: Alert and oriented.  Psych: Normal affect. Skin: Normal. Musculoskeletal: No gross deformities.  ECG: Most recent ECG reviewed.      ASSESSMENT AND PLAN: 1. CAD with CABG and recent inferior wall STEMI: Symptomatically stable. Continue ASA and Plavix x 3 months along with Eliquis. ASA to be discontinued after 3 months. Continue metoprolol, lisinopril, and Lipitor 80 mg. Will make cardiac rehab referral.  2. Essential HTN: Controlled. No changes.  3. Hyperlipidemia: Continue Lipitor 80 mg.  4. Lupus anticoagulant with DVT: Continue Eliquis 2.5 mg BID indefinitely.  5. Acute renal failure: Will check BMET.  6. Tobacco  abuse: Cessation counseling given (1 minute).  Dispo: f/u 3 months.  Kate Sable, M.D., F.A.C.C.

## 2015-11-11 NOTE — Patient Instructions (Addendum)
   Lab for BMET - order given today. Office will contact with results via phone or letter.   Continue all current medications. Cardiac rehab referral. Follow up in  3 months

## 2015-11-12 DIAGNOSIS — N179 Acute kidney failure, unspecified: Secondary | ICD-10-CM | POA: Diagnosis not present

## 2015-11-12 DIAGNOSIS — I10 Essential (primary) hypertension: Secondary | ICD-10-CM | POA: Diagnosis not present

## 2015-11-13 ENCOUNTER — Telehealth: Payer: Self-pay | Admitting: *Deleted

## 2015-11-13 DIAGNOSIS — I25709 Atherosclerosis of coronary artery bypass graft(s), unspecified, with unspecified angina pectoris: Secondary | ICD-10-CM | POA: Diagnosis not present

## 2015-11-13 DIAGNOSIS — I2119 ST elevation (STEMI) myocardial infarction involving other coronary artery of inferior wall: Secondary | ICD-10-CM | POA: Diagnosis not present

## 2015-11-13 DIAGNOSIS — E119 Type 2 diabetes mellitus without complications: Secondary | ICD-10-CM | POA: Diagnosis not present

## 2015-11-13 DIAGNOSIS — J9601 Acute respiratory failure with hypoxia: Secondary | ICD-10-CM | POA: Diagnosis not present

## 2015-11-13 DIAGNOSIS — J441 Chronic obstructive pulmonary disease with (acute) exacerbation: Secondary | ICD-10-CM | POA: Diagnosis not present

## 2015-11-13 DIAGNOSIS — I5032 Chronic diastolic (congestive) heart failure: Secondary | ICD-10-CM | POA: Diagnosis not present

## 2015-11-13 NOTE — Telephone Encounter (Signed)
Notes Recorded by Laurine Blazer, LPN on [id number] at [phone] PM Patient notified. Copy to pmd.

## 2015-11-13 NOTE — Telephone Encounter (Signed)
-----   Message from Herminio Commons, MD sent at 11/12/2015  4:01 PM EST ----- Stable.

## 2015-11-17 ENCOUNTER — Ambulatory Visit (INDEPENDENT_AMBULATORY_CARE_PROVIDER_SITE_OTHER): Payer: Medicare Other | Admitting: Pulmonary Disease

## 2015-11-17 ENCOUNTER — Encounter: Payer: Self-pay | Admitting: Pulmonary Disease

## 2015-11-17 VITALS — BP 110/72 | HR 78 | Ht <= 58 in | Wt 230.0 lb

## 2015-11-17 DIAGNOSIS — F17209 Nicotine dependence, unspecified, with unspecified nicotine-induced disorders: Secondary | ICD-10-CM

## 2015-11-17 DIAGNOSIS — Z720 Tobacco use: Secondary | ICD-10-CM | POA: Diagnosis not present

## 2015-11-17 DIAGNOSIS — G4733 Obstructive sleep apnea (adult) (pediatric): Secondary | ICD-10-CM | POA: Diagnosis not present

## 2015-11-17 DIAGNOSIS — J9621 Acute and chronic respiratory failure with hypoxia: Secondary | ICD-10-CM

## 2015-11-17 DIAGNOSIS — Z930 Tracheostomy status: Secondary | ICD-10-CM

## 2015-11-17 DIAGNOSIS — Z716 Tobacco abuse counseling: Secondary | ICD-10-CM

## 2015-11-17 MED ORDER — ALBUTEROL SULFATE (2.5 MG/3ML) 0.083% IN NEBU: 2.5000 mg | INHALATION_SOLUTION | RESPIRATORY_TRACT | Status: DC | PRN

## 2015-11-17 MED ORDER — IPRATROPIUM-ALBUTEROL 0.5-2.5 (3) MG/3ML IN SOLN: 3.0000 mL | Freq: Four times a day (QID) | RESPIRATORY_TRACT | Status: DC

## 2015-11-17 NOTE — Patient Instructions (Addendum)
1.  Review your medications carefully as they have changed.    2.  Use Duoneb every 6 hours while awake at home (this is your maintenance therapy) 3.  Use albuterol alone for "rescue" when you have shortness of breath in between maintenance therapy 4.  We have set up home respiratory therapy for you and they should be bringing you trach supplies 5. STOP SMOKING!  You can do it! 6.  Please call the office if you have new needs.  7.  Please follow up with Dr. Chase Caller in 3-4 months for COPD assessment

## 2015-11-17 NOTE — Progress Notes (Signed)
Current Outpatient Prescriptions on File Prior to Visit  Medication Sig  . acetaminophen (TYLENOL) 325 MG tablet Take 2 tablets (650 mg total) by mouth every 4 (four) hours as needed for headache or mild pain.  Marland Kitchen apixaban (ELIQUIS) 2.5 MG TABS tablet Take 1 tablet (2.5 mg total) by mouth 2 (two) times daily.  Marland Kitchen aspirin 81 MG chewable tablet Chew 1 tablet (81 mg total) by mouth daily.  Marland Kitchen atorvastatin (LIPITOR) 80 MG tablet Take 1 tablet (80 mg total) by mouth daily at 6 PM.  . clopidogrel (PLAVIX) 75 MG tablet Take 1 tablet (75 mg total) by mouth daily.  . diazepam (VALIUM) 5 MG tablet Take 5 mg by mouth at bedtime. Take every night per patient  . furosemide (LASIX) 40 MG tablet Take 1 tablet (40 mg total) by mouth daily.  Marland Kitchen gabapentin (NEURONTIN) 100 MG capsule Take 300 mg by mouth 2 (two) times daily.   Marland Kitchen lisinopril (PRINIVIL,ZESTRIL) 20 MG tablet Take 1 tablet (20 mg total) by mouth daily.  . metFORMIN (GLUCOPHAGE) 500 MG tablet Take 500 mg by mouth 2 (two) times daily with a meal.  . metoprolol (LOPRESSOR) 50 MG tablet Take 50 mg by mouth 2 (two) times daily.  . nitroGLYCERIN (NITROSTAT) 0.4 MG SL tablet Place 0.4 mg under the tongue every 5 (five) minutes as needed for chest pain.  . simethicone (GAS-X) 80 MG chewable tablet Chew 80 mg by mouth every 6 (six) hours as needed for flatulence.  . budesonide (PULMICORT) 0.5 MG/2ML nebulizer solution Take 2 mLs (0.5 mg total) by nebulization 2 (two) times daily. (Patient not taking: Reported on 11/17/2015)   No current facility-administered medications on file prior to visit.     Chief Complaint  Patient presents with  . Hospitalization Follow-up    Breathing is doing well since leaving the hospital. Reports increased weakness and fatigue. Denies increased SOB, chest tightness, wheezing or coughing at this time.     Tests 2/23 ECHO >> PASP 40, LVEF [id number], diastolic dysfunction  Past medical hx  has a past medical history of Lupus  anticoagulant disorder ([place]); Respiratory failure ([place]); CAD (coronary artery disease); Diabetes mellitus ([place]); HTN (hypertension); Hyperlipidemia; Cervical cancer ([place]); CAD (coronary artery disease) of artery bypass graft (11/03/2015); Tracheostomy in place [facility]), chronic since 2002 (11/03/2015); and ST elevation (STEMI) myocardial infarction involving right coronary artery ([place]) (10/29/15).   Past surgical hx, Allergies, Family hx, Social hx all reviewed.  Vital Signs BP 110/72 mmHg  Pulse 78  Ht 4\' 9"  (1.448 m)  Wt 230 lb (104.327 kg)  BMI 49.76 kg/m2  SpO2 92%  History of Present Illness NONYA PAHL is a 50 y.o. female, smoker,  with PMH as above and COPD, OSA with nocturnal 4L O2, 2-3 L O2 use PRN during day, chronic metal trach (since 2002) and recent admission to [facility] from 2/22-2/27 for an acute STEMI involving the RCA, acute hypoxic respiratory failure, PNA vs Tracheobronchitis with serratia positive sputum (d/c'd on Cipro) who presented to the pulmonary office on 3/13 for hospital follow up.   The patient reports she finished abx despite being diffic ult to take.  She did have an upset stomach with the meds - felt nausea but no diarrhea.  Notes feeling better overall but weak, slow and tired.   Denies fevers / chills. She was discharged on lasix and notes some edema in lower extremities.  Diet review - eats hot dogs, chinese food.  Discharged on baby ASA, plavix and  Eliquis.    The patient reports she has had a trach since 2002 and purchases her supplies from [place] drug store.   Minimal secretions out of trach.  Previously followed by [facility] for trach.  Cleans trach 8-10 times per day (notes trach is original from 2002 and has pin holes in it from cleaning it so much). Continues to smoke - 1/2+ per day.  O2 4L at night, day time only when she feels short of breath, or if sleeping a lot during day.  Pt was unable to afford brovana / pulmicort (>400$ each)!   Physical Exam  General -  well developed adult in no acute distress ENT - No sinus tenderness, no oral exudate, no LAN Cardiac - s1s2 regular, no murmur Chest - even/non-labored, lungs bilaterally clear.  Jackson trach midline, clean / dry.   No wheeze/rales Back - No focal tenderness Abd - Soft, non-tender Ext - BLE with 1+ pitting edema.   Neuro - Normal strength Skin - psoriatic rash on extremities Psych - normal mood, and behavior   Assessment/Plan  Acute on Chronic Hypoxic Respiratory Failure  Tracheostomy Status   Plan:  Continue O2 4L at night, 2-3 L during day DME referral for trach supplies    Tobacco Abuse   Plan: Referral to smoking cessation counseling  Smoking cessation encouraged in office.  Discussed tips for quitting and disease process.  Patient encouraged to quit smoking    OSA s/p Tracheostomy   Plan: Consider outpatient sleep study    COPD - not defined   Plan: Consider PFT's  Change duoneb to Q6 while awake  PRN albuterol neb for rescue Rx provided Follow up with Dr. Chase Caller in 3 months  Patient Instructions  1.  Review your medications carefully as they have changed.    2.  Use Duoneb every 6 hours while awake at home (this is your maintenance therapy) 3.  Use albuterol alone for "rescue" when you have shortness of breath in between maintenance therapy 4.  We have set up home respiratory therapy for you and they should be bringing you trach supplies 5. STOP SMOKING!  You can do it! 6.  Please call the office if you have new needs.  7.  Please follow up with Dr. Chase Caller in 3-4 months for COPD assessment      Noe Gens, NP-C [place]  [phone] 11/17/2015, 10:06 AM

## 2015-11-19 DIAGNOSIS — J9601 Acute respiratory failure with hypoxia: Secondary | ICD-10-CM | POA: Diagnosis not present

## 2015-11-19 DIAGNOSIS — I5032 Chronic diastolic (congestive) heart failure: Secondary | ICD-10-CM | POA: Diagnosis not present

## 2015-11-19 DIAGNOSIS — I2119 ST elevation (STEMI) myocardial infarction involving other coronary artery of inferior wall: Secondary | ICD-10-CM | POA: Diagnosis not present

## 2015-11-19 DIAGNOSIS — I25709 Atherosclerosis of coronary artery bypass graft(s), unspecified, with unspecified angina pectoris: Secondary | ICD-10-CM | POA: Diagnosis not present

## 2015-11-19 DIAGNOSIS — E119 Type 2 diabetes mellitus without complications: Secondary | ICD-10-CM | POA: Diagnosis not present

## 2015-11-19 DIAGNOSIS — J441 Chronic obstructive pulmonary disease with (acute) exacerbation: Secondary | ICD-10-CM | POA: Diagnosis not present

## 2015-11-20 ENCOUNTER — Other Ambulatory Visit: Payer: Self-pay

## 2015-11-20 DIAGNOSIS — I82409 Acute embolism and thrombosis of unspecified deep veins of unspecified lower extremity: Secondary | ICD-10-CM | POA: Diagnosis not present

## 2015-11-20 DIAGNOSIS — J449 Chronic obstructive pulmonary disease, unspecified: Secondary | ICD-10-CM | POA: Diagnosis not present

## 2015-11-20 DIAGNOSIS — F329 Major depressive disorder, single episode, unspecified: Secondary | ICD-10-CM | POA: Diagnosis not present

## 2015-11-20 DIAGNOSIS — E7800 Pure hypercholesterolemia, unspecified: Secondary | ICD-10-CM | POA: Diagnosis not present

## 2015-11-20 DIAGNOSIS — Z1389 Encounter for screening for other disorder: Secondary | ICD-10-CM | POA: Diagnosis not present

## 2015-11-20 MED ORDER — IPRATROPIUM-ALBUTEROL 0.5-2.5 (3) MG/3ML IN SOLN: 3.0000 mL | Freq: Four times a day (QID) | RESPIRATORY_TRACT | Status: DC

## 2015-11-26 DIAGNOSIS — I5032 Chronic diastolic (congestive) heart failure: Secondary | ICD-10-CM | POA: Diagnosis not present

## 2015-11-26 DIAGNOSIS — E119 Type 2 diabetes mellitus without complications: Secondary | ICD-10-CM | POA: Diagnosis not present

## 2015-11-26 DIAGNOSIS — J9601 Acute respiratory failure with hypoxia: Secondary | ICD-10-CM | POA: Diagnosis not present

## 2015-11-26 DIAGNOSIS — I2119 ST elevation (STEMI) myocardial infarction involving other coronary artery of inferior wall: Secondary | ICD-10-CM | POA: Diagnosis not present

## 2015-11-26 DIAGNOSIS — J441 Chronic obstructive pulmonary disease with (acute) exacerbation: Secondary | ICD-10-CM | POA: Diagnosis not present

## 2015-11-26 DIAGNOSIS — I25709 Atherosclerosis of coronary artery bypass graft(s), unspecified, with unspecified angina pectoris: Secondary | ICD-10-CM | POA: Diagnosis not present

## 2015-12-01 ENCOUNTER — Telehealth: Payer: Self-pay | Admitting: *Deleted

## 2015-12-01 NOTE — Telephone Encounter (Signed)
Pt says Eliquis will cost her $195/month and cannot afford this, she was given samples and 30 day free card which she used and has enough for today and tomorrow. Pt would like to resume coumadin. Will forward to Dr. Bronson Ing.

## 2015-12-01 NOTE — Telephone Encounter (Signed)
Ok to resume warfarin with follow up in anticoagulation clinic.

## 2015-12-02 NOTE — Telephone Encounter (Signed)
Patient informed and verbalized understanding of plan. Patient said that her PCP (Dr. Manuella Ghazi) follows her INR's for being on warfarin. Patient aware to contact their office.

## 2015-12-02 NOTE — Telephone Encounter (Signed)
Patient returned call to office to see if we have an decision on her coumdin .

## 2015-12-03 ENCOUNTER — Ambulatory Visit (HOSPITAL_COMMUNITY)
Admission: RE | Admit: 2015-12-03 | Discharge: 2015-12-03 | Disposition: A | Discharge status: Home / Self Care | Payer: Medicare Other | Source: Ambulatory Visit | Attending: Internal Medicine | Admitting: Internal Medicine

## 2015-12-03 ENCOUNTER — Other Ambulatory Visit: Payer: Self-pay | Admitting: Acute Care

## 2015-12-03 DIAGNOSIS — I252 Old myocardial infarction: Secondary | ICD-10-CM | POA: Insufficient documentation

## 2015-12-03 DIAGNOSIS — E785 Hyperlipidemia, unspecified: Secondary | ICD-10-CM | POA: Insufficient documentation

## 2015-12-03 DIAGNOSIS — E119 Type 2 diabetes mellitus without complications: Secondary | ICD-10-CM | POA: Diagnosis not present

## 2015-12-03 DIAGNOSIS — I10 Essential (primary) hypertension: Secondary | ICD-10-CM | POA: Insufficient documentation

## 2015-12-03 DIAGNOSIS — Z880 Allergy status to penicillin: Secondary | ICD-10-CM | POA: Diagnosis not present

## 2015-12-03 DIAGNOSIS — Z930 Tracheostomy status: Secondary | ICD-10-CM

## 2015-12-03 DIAGNOSIS — I25810 Atherosclerosis of coronary artery bypass graft(s) without angina pectoris: Secondary | ICD-10-CM | POA: Insufficient documentation

## 2015-12-03 DIAGNOSIS — F17210 Nicotine dependence, cigarettes, uncomplicated: Secondary | ICD-10-CM | POA: Insufficient documentation

## 2015-12-03 DIAGNOSIS — Z955 Presence of coronary angioplasty implant and graft: Secondary | ICD-10-CM | POA: Diagnosis not present

## 2015-12-03 DIAGNOSIS — Z430 Encounter for attention to tracheostomy: Secondary | ICD-10-CM | POA: Insufficient documentation

## 2015-12-03 DIAGNOSIS — Z7901 Long term (current) use of anticoagulants: Secondary | ICD-10-CM | POA: Insufficient documentation

## 2015-12-03 DIAGNOSIS — G4733 Obstructive sleep apnea (adult) (pediatric): Secondary | ICD-10-CM | POA: Diagnosis not present

## 2015-12-03 DIAGNOSIS — Z8249 Family history of ischemic heart disease and other diseases of the circulatory system: Secondary | ICD-10-CM | POA: Insufficient documentation

## 2015-12-03 DIAGNOSIS — J9611 Chronic respiratory failure with hypoxia: Secondary | ICD-10-CM | POA: Insufficient documentation

## 2015-12-03 DIAGNOSIS — Z8541 Personal history of malignant neoplasm of cervix uteri: Secondary | ICD-10-CM | POA: Diagnosis not present

## 2015-12-03 MED ORDER — TRACHEOSTOMY CARE KIT: 1.0000 | PACK | Freq: Every day | Status: DC

## 2015-12-03 NOTE — Progress Notes (Signed)
Tracheostomy Procedure Note  Theresa [name] [id number] 03/09/1966  Pre Procedure Tracheostomy Information  Trach Brand: Mikle Bosworth Size: 6.0 Style: Uncuffed Secured by: Velcro   Procedure: evaluation and education.    Post Procedure Tracheostomy Information  Trach Brand: Mikle Bosworth Size: 6.0 Style: Uncuffed Secured by: Velcro   Post Procedure Evaluation:   Vital signs: pulse 70, respirations 20 and pulse oximetry 92 % Patients current condition: stable Complications: No apparent complications Trach site exam: clean, dry Wound care done: dry and clean    Education: Better ways to care for trach  Prescription needs: Trach cleaning supplies Spare 6.0 jackson trach    Additional needs:  none

## 2015-12-11 DIAGNOSIS — J441 Chronic obstructive pulmonary disease with (acute) exacerbation: Secondary | ICD-10-CM | POA: Diagnosis not present

## 2015-12-11 DIAGNOSIS — I25709 Atherosclerosis of coronary artery bypass graft(s), unspecified, with unspecified angina pectoris: Secondary | ICD-10-CM | POA: Diagnosis not present

## 2015-12-11 DIAGNOSIS — E119 Type 2 diabetes mellitus without complications: Secondary | ICD-10-CM | POA: Diagnosis not present

## 2015-12-11 DIAGNOSIS — J9601 Acute respiratory failure with hypoxia: Secondary | ICD-10-CM | POA: Diagnosis not present

## 2015-12-11 DIAGNOSIS — I2119 ST elevation (STEMI) myocardial infarction involving other coronary artery of inferior wall: Secondary | ICD-10-CM | POA: Diagnosis not present

## 2015-12-11 DIAGNOSIS — I5032 Chronic diastolic (congestive) heart failure: Secondary | ICD-10-CM | POA: Diagnosis not present

## 2015-12-12 DIAGNOSIS — I2510 Atherosclerotic heart disease of native coronary artery without angina pectoris: Secondary | ICD-10-CM | POA: Diagnosis not present

## 2015-12-12 DIAGNOSIS — I25709 Atherosclerosis of coronary artery bypass graft(s), unspecified, with unspecified angina pectoris: Secondary | ICD-10-CM | POA: Diagnosis not present

## 2015-12-12 DIAGNOSIS — Z930 Tracheostomy status: Secondary | ICD-10-CM | POA: Insufficient documentation

## 2015-12-12 DIAGNOSIS — J9601 Acute respiratory failure with hypoxia: Secondary | ICD-10-CM | POA: Diagnosis not present

## 2015-12-12 DIAGNOSIS — I82409 Acute embolism and thrombosis of unspecified deep veins of unspecified lower extremity: Secondary | ICD-10-CM | POA: Diagnosis not present

## 2015-12-12 DIAGNOSIS — I5032 Chronic diastolic (congestive) heart failure: Secondary | ICD-10-CM | POA: Diagnosis not present

## 2015-12-12 DIAGNOSIS — E119 Type 2 diabetes mellitus without complications: Secondary | ICD-10-CM | POA: Diagnosis not present

## 2015-12-12 DIAGNOSIS — J441 Chronic obstructive pulmonary disease with (acute) exacerbation: Secondary | ICD-10-CM | POA: Diagnosis not present

## 2015-12-12 DIAGNOSIS — I2119 ST elevation (STEMI) myocardial infarction involving other coronary artery of inferior wall: Secondary | ICD-10-CM | POA: Diagnosis not present

## 2015-12-12 NOTE — Consult Note (Signed)
Reason for Consult: on-going trach management  Referring Physician: Noe Gens   HPI Theresa [name] is an 50 y.o. female. with PMH of: COPD, OSA with nocturnal 4L O2, 2-3 L O2 use PRN during day, chronic metal trach (since 2002) and recent admission to [facility] from 2/22-2/27 for an acute STEMI involving the RCA, acute hypoxic respiratory failure, PNA vs Tracheobronchitis with serratia positive sputum (d/c'd on Cipro). The patient reports she has had a trach since 2002 and purchases her supplies from [place] [facility] store. Minimal secretions out of trach. Previously followed by [facility] for trach. Cleans trach 8-10 times per day (notes trach is original from 2002 and has pin holes in it from cleaning it so much).Continues to smoke - 1/2+ per day. O2 4L at night, day time only when she feels short of breath, or if sleeping a lot during day.she presents today to become established that the tracheostomy clinic.   Past Medical History  Diagnosis Date  . Lupus anticoagulant disorder (HCC)     on coumadin  . Respiratory failure ([place])     s/p tracheostomy 2002  . CAD (coronary artery disease)     s/p 2V CABG in 2007  . Diabetes mellitus ([place])   . HTN (hypertension)   . Hyperlipidemia   . Cervical cancer ([place])   . CAD (coronary artery disease) of artery bypass graft 11/03/2015  . Tracheostomy in place [facility]), chronic since 2002 11/03/2015  . ST elevation (STEMI) myocardial infarction involving right coronary artery ([place]) 10/29/15    stent to VG to PDA    Past Surgical History  Procedure Laterality Date  . Coronary artery bypass graft  2007    2V  . Carotid stent    . Tracheostomy    . Cesarean section with bilateral tubal ligation    . Cardiac catheterization N/A 10/29/2015    Procedure: Left Heart Cath and Cors/Grafts Angiography;  Surgeon: Burnell Blanks, MD;  Location: [place] [facility];  Service: Cardiovascular;  Laterality: N/A;  . Cardiac catheterization  10/29/2015    Procedure:  Coronary Stent Intervention;  Surgeon: Burnell Blanks, MD;  Location: [place] [facility];  Service: Cardiovascular;;    Family History  Problem Relation Age of Onset  . Hypertension Mother     Social History:  reports that she has been smoking Cigarettes.  She has been smoking about 0.50 packs per day. She has never used smokeless tobacco. She reports that she does not drink alcohol or use illicit drugs.  Allergies:  Allergies  Allergen Reactions  . Penicillins Rash    Has patient had a PCN reaction causing immediate rash, facial/tongue/throat swelling, SOB or lightheadedness with hypotension: Yes Has patient had a PCN reaction causing severe rash involving mucus membranes or skin necrosis: No Has patient had a PCN reaction that required hospitalization No Has patient had a PCN reaction occurring within the last 10 years: No If all of the above answers are "NO", then may proceed with Cephalosporin use.   REACTION: rash    Medications: I have reviewed the patient's current medications.  No results found for this or any previous visit (from the past 48 hour(s)).  No results found.  Review of Systems  Constitutional: Negative.   HENT: Negative.   Eyes: Negative.   Respiratory: Positive for cough and hemoptysis. Negative for sputum production and shortness of breath.   Cardiovascular: Negative.   Gastrointestinal: Negative for heartburn, nausea and vomiting.  Genitourinary: Negative.   Musculoskeletal: Negative.  Skin: Negative.   Neurological: Negative.   Psychiatric/Behavioral: Negative.   pulse 70, respirations 20 and pulse oximetry 92 % Physical Exam  Constitutional: She is oriented to person, place, and time. She appears well-developed and well-nourished.  HENT:  Head: Normocephalic.  Mouth/Throat: Oropharynx is clear and moist. No oropharyngeal exudate.  Metal trach pitted  Stoma site is clean   Eyes: Pupils are equal, round, and reactive to light. Right  eye exhibits no discharge. Left eye exhibits no discharge.  Neck: Normal range of motion. No tracheal deviation present.  Cardiovascular: Normal rate.  Exam reveals no gallop and no friction rub.   No murmur heard. Respiratory: Effort normal. No respiratory distress. She has no wheezes. She has no rales. She exhibits no tenderness.  GI: Soft. Bowel sounds are normal. She exhibits no distension. There is no tenderness. There is no guarding.  Musculoskeletal: Normal range of motion. She exhibits no edema or tenderness.  Neurological: She is alert and oriented to person, place, and time. Coordination normal.  Skin: Skin is warm and dry. No erythema.  Psychiatric: She has a normal mood and affect. Her behavior is normal.    Assessment/Plan: Chronic Hypoxic Respiratory Failure  Obstructive sleep apnea  Tracheostomy Status  On going tobacco abuse  Probable COPD  Plan:  Continue O2 4L at night, 2-3 L during day Cont metal #6 Glennon Mac We will plan on changing this every 6 months Have placed DME order for supplies and ordered her a back up trach All questions answered  Erick Colace ACNP-BC [place] Pager # [phone] OR # [phone] if no answer   Clementeen Graham 12/12/2015, 1:36 PM

## 2015-12-26 DIAGNOSIS — I5032 Chronic diastolic (congestive) heart failure: Secondary | ICD-10-CM | POA: Diagnosis not present

## 2015-12-26 DIAGNOSIS — I25709 Atherosclerosis of coronary artery bypass graft(s), unspecified, with unspecified angina pectoris: Secondary | ICD-10-CM | POA: Diagnosis not present

## 2015-12-26 DIAGNOSIS — I2119 ST elevation (STEMI) myocardial infarction involving other coronary artery of inferior wall: Secondary | ICD-10-CM | POA: Diagnosis not present

## 2015-12-26 DIAGNOSIS — J9601 Acute respiratory failure with hypoxia: Secondary | ICD-10-CM | POA: Diagnosis not present

## 2015-12-26 DIAGNOSIS — J441 Chronic obstructive pulmonary disease with (acute) exacerbation: Secondary | ICD-10-CM | POA: Diagnosis not present

## 2015-12-26 DIAGNOSIS — E119 Type 2 diabetes mellitus without complications: Secondary | ICD-10-CM | POA: Diagnosis not present

## 2016-01-05 DIAGNOSIS — Z8719 Personal history of other diseases of the digestive system: Secondary | ICD-10-CM

## 2016-01-05 HISTORY — DX: Personal history of other diseases of the digestive system: Z87.19

## 2016-01-22 ENCOUNTER — Inpatient Hospital Stay (HOSPITAL_COMMUNITY): Payer: Medicare Other

## 2016-01-22 ENCOUNTER — Encounter (HOSPITAL_COMMUNITY): Payer: Self-pay | Admitting: *Deleted

## 2016-01-22 ENCOUNTER — Inpatient Hospital Stay (HOSPITAL_COMMUNITY)
Admission: AD | Admit: 2016-01-22 | Discharge: 2016-01-27 | DRG: 291 | Disposition: A | Discharge status: Home Health | Payer: Medicare Other | Source: Other Acute Inpatient Hospital | Attending: Internal Medicine | Admitting: Internal Medicine

## 2016-01-22 DIAGNOSIS — I252 Old myocardial infarction: Secondary | ICD-10-CM | POA: Diagnosis not present

## 2016-01-22 DIAGNOSIS — I5033 Acute on chronic diastolic (congestive) heart failure: Secondary | ICD-10-CM | POA: Diagnosis present

## 2016-01-22 DIAGNOSIS — J189 Pneumonia, unspecified organism: Secondary | ICD-10-CM | POA: Diagnosis present

## 2016-01-22 DIAGNOSIS — Z880 Allergy status to penicillin: Secondary | ICD-10-CM

## 2016-01-22 DIAGNOSIS — K8020 Calculus of gallbladder without cholecystitis without obstruction: Secondary | ICD-10-CM | POA: Diagnosis present

## 2016-01-22 DIAGNOSIS — J441 Chronic obstructive pulmonary disease with (acute) exacerbation: Secondary | ICD-10-CM | POA: Diagnosis present

## 2016-01-22 DIAGNOSIS — R0602 Shortness of breath: Secondary | ICD-10-CM | POA: Diagnosis not present

## 2016-01-22 DIAGNOSIS — Z8541 Personal history of malignant neoplasm of cervix uteri: Secondary | ICD-10-CM

## 2016-01-22 DIAGNOSIS — I5020 Unspecified systolic (congestive) heart failure: Secondary | ICD-10-CM | POA: Diagnosis not present

## 2016-01-22 DIAGNOSIS — J9600 Acute respiratory failure, unspecified whether with hypoxia or hypercapnia: Secondary | ICD-10-CM | POA: Diagnosis not present

## 2016-01-22 DIAGNOSIS — E785 Hyperlipidemia, unspecified: Secondary | ICD-10-CM | POA: Diagnosis present

## 2016-01-22 DIAGNOSIS — Z7901 Long term (current) use of anticoagulants: Secondary | ICD-10-CM

## 2016-01-22 DIAGNOSIS — E1165 Type 2 diabetes mellitus with hyperglycemia: Secondary | ICD-10-CM | POA: Diagnosis present

## 2016-01-22 DIAGNOSIS — I509 Heart failure, unspecified: Secondary | ICD-10-CM | POA: Diagnosis not present

## 2016-01-22 DIAGNOSIS — I10 Essential (primary) hypertension: Secondary | ICD-10-CM | POA: Diagnosis present

## 2016-01-22 DIAGNOSIS — I2510 Atherosclerotic heart disease of native coronary artery without angina pectoris: Secondary | ICD-10-CM | POA: Diagnosis present

## 2016-01-22 DIAGNOSIS — Z79899 Other long term (current) drug therapy: Secondary | ICD-10-CM

## 2016-01-22 DIAGNOSIS — G4733 Obstructive sleep apnea (adult) (pediatric): Secondary | ICD-10-CM | POA: Diagnosis present

## 2016-01-22 DIAGNOSIS — Z9981 Dependence on supplemental oxygen: Secondary | ICD-10-CM

## 2016-01-22 DIAGNOSIS — Z951 Presence of aortocoronary bypass graft: Secondary | ICD-10-CM

## 2016-01-22 DIAGNOSIS — D6862 Lupus anticoagulant syndrome: Secondary | ICD-10-CM | POA: Diagnosis present

## 2016-01-22 DIAGNOSIS — J9601 Acute respiratory failure with hypoxia: Secondary | ICD-10-CM | POA: Diagnosis present

## 2016-01-22 DIAGNOSIS — Z7982 Long term (current) use of aspirin: Secondary | ICD-10-CM

## 2016-01-22 DIAGNOSIS — Z7902 Long term (current) use of antithrombotics/antiplatelets: Secondary | ICD-10-CM | POA: Diagnosis not present

## 2016-01-22 DIAGNOSIS — F17210 Nicotine dependence, cigarettes, uncomplicated: Secondary | ICD-10-CM | POA: Diagnosis present

## 2016-01-22 DIAGNOSIS — Z955 Presence of coronary angioplasty implant and graft: Secondary | ICD-10-CM

## 2016-01-22 DIAGNOSIS — R05 Cough: Secondary | ICD-10-CM | POA: Diagnosis not present

## 2016-01-22 DIAGNOSIS — J440 Chronic obstructive pulmonary disease with acute lower respiratory infection: Secondary | ICD-10-CM | POA: Diagnosis present

## 2016-01-22 DIAGNOSIS — I110 Hypertensive heart disease with heart failure: Principal | ICD-10-CM | POA: Diagnosis present

## 2016-01-22 DIAGNOSIS — Z930 Tracheostomy status: Secondary | ICD-10-CM | POA: Diagnosis not present

## 2016-01-22 DIAGNOSIS — L409 Psoriasis, unspecified: Secondary | ICD-10-CM | POA: Diagnosis not present

## 2016-01-22 DIAGNOSIS — I5021 Acute systolic (congestive) heart failure: Secondary | ICD-10-CM | POA: Diagnosis not present

## 2016-01-22 DIAGNOSIS — E7800 Pure hypercholesterolemia, unspecified: Secondary | ICD-10-CM | POA: Diagnosis not present

## 2016-01-22 DIAGNOSIS — R112 Nausea with vomiting, unspecified: Secondary | ICD-10-CM | POA: Insufficient documentation

## 2016-01-22 DIAGNOSIS — J9621 Acute and chronic respiratory failure with hypoxia: Secondary | ICD-10-CM | POA: Diagnosis present

## 2016-01-22 DIAGNOSIS — E1142 Type 2 diabetes mellitus with diabetic polyneuropathy: Secondary | ICD-10-CM | POA: Diagnosis not present

## 2016-01-22 HISTORY — DX: Chronic obstructive pulmonary disease, unspecified: J44.9

## 2016-01-22 HISTORY — DX: Endometriosis, unspecified: N80.9

## 2016-01-22 MED ORDER — ACETAMINOPHEN 650 MG RE SUPP
650.0000 mg | Freq: Four times a day (QID) | RECTAL | Status: DC | PRN
Start: 2016-01-22 — End: 2016-01-27

## 2016-01-22 MED ORDER — FUROSEMIDE 10 MG/ML IJ SOLN
40.0000 mg | Freq: Every day | INTRAMUSCULAR | Status: DC
  Administered 2016-01-23: 40 mg via INTRAVENOUS
  Filled 2016-01-22: qty 4

## 2016-01-22 MED ORDER — ALBUTEROL SULFATE (2.5 MG/3ML) 0.083% IN NEBU: 2.5000 mg | INHALATION_SOLUTION | RESPIRATORY_TRACT | Status: DC

## 2016-01-22 MED ORDER — INSULIN GLARGINE 100 UNIT/ML ~~LOC~~ SOLN
5.0000 [IU] | Freq: Every day | SUBCUTANEOUS | Status: DC
  Administered 2016-01-23: 5 [IU] via SUBCUTANEOUS
  Filled 2016-01-22 (×2): qty 0.05

## 2016-01-22 MED ORDER — METRONIDAZOLE IN NACL 5-0.79 MG/ML-% IV SOLN
500.0000 mg | Freq: Three times a day (TID) | INTRAVENOUS | Status: DC
  Administered 2016-01-23 – 2016-01-27 (×14): 500 mg via INTRAVENOUS
  Filled 2016-01-22 (×16): qty 100

## 2016-01-22 MED ORDER — ATORVASTATIN CALCIUM 80 MG PO TABS
80.0000 mg | ORAL_TABLET | Freq: Every day | ORAL | Status: DC
  Administered 2016-01-23 – 2016-01-26 (×4): 80 mg via ORAL
  Filled 2016-01-22 (×4): qty 1

## 2016-01-22 MED ORDER — BUDESONIDE 0.5 MG/2ML IN SUSP
0.5000 mg | Freq: Two times a day (BID) | RESPIRATORY_TRACT | Status: DC
  Administered 2016-01-23: 0.5 mg via RESPIRATORY_TRACT
  Filled 2016-01-22: qty 2

## 2016-01-22 MED ORDER — CHLORHEXIDINE GLUCONATE 0.12 % MT SOLN
15.0000 mL | Freq: Two times a day (BID) | OROMUCOSAL | Status: DC
  Administered 2016-01-22 – 2016-01-26 (×9): 15 mL via OROMUCOSAL
  Filled 2016-01-22 (×6): qty 15

## 2016-01-22 MED ORDER — ASPIRIN 81 MG PO CHEW
81.0000 mg | CHEWABLE_TABLET | Freq: Every day | ORAL | Status: DC
  Administered 2016-01-23 – 2016-01-27 (×5): 81 mg via ORAL
  Filled 2016-01-22 (×5): qty 1

## 2016-01-22 MED ORDER — IPRATROPIUM-ALBUTEROL 0.5-2.5 (3) MG/3ML IN SOLN
RESPIRATORY_TRACT | Status: AC
  Administered 2016-01-23: 3 mL via RESPIRATORY_TRACT
  Filled 2016-01-22: qty 3

## 2016-01-22 MED ORDER — CLOPIDOGREL BISULFATE 75 MG PO TABS
75.0000 mg | ORAL_TABLET | Freq: Every day | ORAL | Status: DC
  Administered 2016-01-23 – 2016-01-27 (×5): 75 mg via ORAL
  Filled 2016-01-22 (×5): qty 1

## 2016-01-22 MED ORDER — GABAPENTIN 300 MG PO CAPS
300.0000 mg | ORAL_CAPSULE | Freq: Two times a day (BID) | ORAL | Status: DC
  Administered 2016-01-23 – 2016-01-27 (×10): 300 mg via ORAL
  Filled 2016-01-22 (×10): qty 1

## 2016-01-22 MED ORDER — ONDANSETRON HCL 4 MG PO TABS
4.0000 mg | ORAL_TABLET | Freq: Four times a day (QID) | ORAL | Status: DC | PRN
Start: 2016-01-22 — End: 2016-01-27

## 2016-01-22 MED ORDER — SIMETHICONE 80 MG PO CHEW: 80.0000 mg | CHEWABLE_TABLET | Freq: Four times a day (QID) | ORAL | Status: DC | PRN

## 2016-01-22 MED ORDER — ACETAMINOPHEN 325 MG PO TABS: 650.0000 mg | ORAL_TABLET | Freq: Four times a day (QID) | ORAL | Status: DC | PRN

## 2016-01-22 MED ORDER — IPRATROPIUM BROMIDE 0.02 % IN SOLN: 0.5000 mg | RESPIRATORY_TRACT | Status: DC

## 2016-01-22 MED ORDER — ALBUTEROL SULFATE (2.5 MG/3ML) 0.083% IN NEBU: 2.5000 mg | INHALATION_SOLUTION | RESPIRATORY_TRACT | Status: DC | PRN

## 2016-01-22 MED ORDER — METHYLPREDNISOLONE SODIUM SUCC 40 MG IJ SOLR
40.0000 mg | Freq: Two times a day (BID) | INTRAMUSCULAR | Status: DC
  Administered 2016-01-23 – 2016-01-24 (×4): 40 mg via INTRAVENOUS
  Filled 2016-01-22 (×4): qty 1

## 2016-01-22 MED ORDER — METOPROLOL TARTRATE 50 MG PO TABS
50.0000 mg | ORAL_TABLET | Freq: Two times a day (BID) | ORAL | Status: DC
  Administered 2016-01-23 – 2016-01-27 (×10): 50 mg via ORAL
  Filled 2016-01-22 (×10): qty 1

## 2016-01-22 MED ORDER — INSULIN ASPART 100 UNIT/ML ~~LOC~~ SOLN
0.0000 [IU] | Freq: Three times a day (TID) | SUBCUTANEOUS | Status: DC
  Administered 2016-01-23: 7 [IU] via SUBCUTANEOUS
  Administered 2016-01-23: 3 [IU] via SUBCUTANEOUS
  Administered 2016-01-23: 7 [IU] via SUBCUTANEOUS
  Administered 2016-01-24 (×2): 9 [IU] via SUBCUTANEOUS
  Administered 2016-01-24: 5 [IU] via SUBCUTANEOUS

## 2016-01-22 MED ORDER — INSULIN ASPART 100 UNIT/ML ~~LOC~~ SOLN
8.0000 [IU] | Freq: Once | SUBCUTANEOUS | Status: AC
  Administered 2016-01-22: 8 [IU] via SUBCUTANEOUS

## 2016-01-22 MED ORDER — ONDANSETRON HCL 4 MG/2ML IJ SOLN: 4.0000 mg | Freq: Four times a day (QID) | INTRAMUSCULAR | Status: DC | PRN

## 2016-01-22 MED ORDER — IPRATROPIUM-ALBUTEROL 0.5-2.5 (3) MG/3ML IN SOLN
3.0000 mL | RESPIRATORY_TRACT | Status: DC
  Administered 2016-01-23: 3 mL via RESPIRATORY_TRACT
  Filled 2016-01-22: qty 3

## 2016-01-22 MED ORDER — IPRATROPIUM-ALBUTEROL 0.5-2.5 (3) MG/3ML IN SOLN: 3.0000 mL | Freq: Four times a day (QID) | RESPIRATORY_TRACT | Status: DC

## 2016-01-22 MED ORDER — TRACHEOSTOMY CARE KIT: 1.0000 | PACK | Freq: Every day | Status: DC

## 2016-01-22 MED ORDER — LEVOFLOXACIN IN D5W 750 MG/150ML IV SOLN
750.0000 mg | Freq: Every day | INTRAVENOUS | Status: AC
  Administered 2016-01-23 – 2016-01-26 (×5): 750 mg via INTRAVENOUS
  Filled 2016-01-22 (×5): qty 150

## 2016-01-22 MED ORDER — CETYLPYRIDINIUM CHLORIDE 0.05 % MT LIQD
7.0000 mL | Freq: Two times a day (BID) | OROMUCOSAL | Status: DC
  Administered 2016-01-23 – 2016-01-26 (×8): 7 mL via OROMUCOSAL

## 2016-01-22 MED ORDER — DIAZEPAM 5 MG PO TABS
5.0000 mg | ORAL_TABLET | Freq: Every day | ORAL | Status: DC
  Administered 2016-01-23 – 2016-01-26 (×5): 5 mg via ORAL
  Filled 2016-01-22 (×5): qty 1

## 2016-01-22 MED ORDER — NITROGLYCERIN 0.4 MG SL SUBL: 0.4000 mg | SUBLINGUAL_TABLET | SUBLINGUAL | Status: DC | PRN

## 2016-01-22 MED ORDER — LISINOPRIL 20 MG PO TABS: 20.0000 mg | ORAL_TABLET | Freq: Every day | ORAL | Status: DC

## 2016-01-22 NOTE — H&P (Signed)
History and Physical    Theresa [name]:[id number] DOB: January 03, 1966 DOA: 01/22/2016  PCP: Monico Blitz, MD  Patient coming from: [facility]. Patient was transferred directly by patient's primary care physician Dr. Manuella Ghazi.  Chief Complaint: Shortness of breath.  HPI: Theresa [name] is a 50 y.o. female with medical history significant of CAD status post CABG, OSA on tracheostomy, COPD, ongoing tobacco abuse, diastolic CHF, diabetes mellitus and lupus anticoagulants on Coumadin started experiencing shortness of breath with productive cough and wheezing this morning after she woke up. Patient also had 3 episodes of nausea vomiting and diarrhea. Had mild epigastric discomfort. Since patient wasn't wheezing severely and since patient may require further assistance with pulmonary team patient was transferred to [facility] for further management. On my exam patient is actively wheezing but denies any chest pain fever chills has some productive cough. Abdomen appears benign. Chest x-ray shows right apical infiltrates and on exam patient also has lower extremity edema. Patient is on 100% oxygen.   ED Course: Patient is a direct admit.  Review of Systems: As per HPI otherwise 10 point review of systems negative.    Past Medical History  Diagnosis Date  . Lupus anticoagulant disorder (HCC)     on coumadin  . Respiratory failure ([place])     s/p tracheostomy 2002  . CAD (coronary artery disease)     s/p 2V CABG in 2007  . Diabetes mellitus ([place])   . HTN (hypertension)   . Hyperlipidemia   . Cervical cancer ([place])   . CAD (coronary artery disease) of artery bypass graft 11/03/2015  . Tracheostomy in place [facility]), chronic since 2002 11/03/2015  . ST elevation (STEMI) myocardial infarction involving right coronary artery ([place]) 10/29/15    stent to VG to PDA  . CHF (congestive heart failure) ([place])   . COPD (chronic obstructive pulmonary disease) ([place])   . Endometriosis      Past Surgical History  Procedure Laterality Date  . Coronary artery bypass graft  2007    2V  . Carotid stent    . Tracheostomy    . Cesarean section with bilateral tubal ligation    . Cardiac catheterization N/A 10/29/2015    Procedure: Left Heart Cath and Cors/Grafts Angiography;  Surgeon: Burnell Blanks, MD;  Location: [place] [facility];  Service: Cardiovascular;  Laterality: N/A;  . Cardiac catheterization  10/29/2015    Procedure: Coronary Stent Intervention;  Surgeon: Burnell Blanks, MD;  Location: [place] [facility];  Service: Cardiovascular;;     reports that she has been smoking Cigarettes.  She has been smoking about 0.50 packs per day. She has never used smokeless tobacco. She reports that she does not drink alcohol or use illicit drugs.  Allergies  Allergen Reactions  . Penicillins Rash    Has patient had a PCN reaction causing immediate rash, facial/tongue/throat swelling, SOB or lightheadedness with hypotension: Yes Has patient had a PCN reaction causing severe rash involving mucus membranes or skin necrosis: No Has patient had a PCN reaction that required hospitalization No Has patient had a PCN reaction occurring within the last 10 years: No If all of the above answers are "NO", then may proceed with Cephalosporin use.   REACTION: rash    Family History  Problem Relation Age of Onset  . Hypertension Mother     Prior to Admission medications   Medication Sig Start Date End Date Taking? Authorizing Provider  acetaminophen (TYLENOL) 325 MG tablet  Take 2 tablets (650 mg total) by mouth every 4 (four) hours as needed for headache or mild pain. 11/03/15   Isaiah Serge, NP  albuterol (PROVENTIL) (2.5 MG/3ML) 0.083% nebulizer solution Take 3 mLs (2.5 mg total) by nebulization every 4 (four) hours as needed for wheezing or shortness of breath. 11/17/15   Donita Brooks, NP  aspirin 81 MG chewable tablet Chew 1 tablet (81 mg total) by mouth daily.  11/03/15   Isaiah Serge, NP  atorvastatin (LIPITOR) 80 MG tablet Take 1 tablet (80 mg total) by mouth daily at 6 PM. 11/03/15   Isaiah Serge, NP  budesonide (PULMICORT) 0.5 MG/2ML nebulizer solution Take 2 mLs (0.5 mg total) by nebulization 2 (two) times daily. Patient not taking: Reported on 11/17/2015 11/03/15   Isaiah Serge, NP  clopidogrel (PLAVIX) 75 MG tablet Take 1 tablet (75 mg total) by mouth daily. 11/03/15   Isaiah Serge, NP  diazepam (VALIUM) 5 MG tablet Take 5 mg by mouth at bedtime. Take every night per patient    Historical Provider, MD  furosemide (LASIX) 40 MG tablet Take 1 tablet (40 mg total) by mouth daily. 11/06/15   Isaiah Serge, NP  gabapentin (NEURONTIN) 100 MG capsule Take 300 mg by mouth 2 (two) times daily.     Historical Provider, MD  ipratropium-albuterol (DUONEB) 0.5-2.5 (3) MG/3ML SOLN Take 3 mLs by nebulization every 6 (six) hours. 11/20/15   Marijean Heath, NP  lisinopril (PRINIVIL,ZESTRIL) 20 MG tablet Take 1 tablet (20 mg total) by mouth daily. 11/05/15   Herminio Commons, MD  metFORMIN (GLUCOPHAGE) 500 MG tablet Take 500 mg by mouth 2 (two) times daily with a meal.    Historical Provider, MD  metoprolol (LOPRESSOR) 50 MG tablet Take 50 mg by mouth 2 (two) times daily.    Historical Provider, MD  nitroGLYCERIN (NITROSTAT) 0.4 MG SL tablet Place 0.4 mg under the tongue every 5 (five) minutes as needed for chest pain.    Historical Provider, MD  simethicone (GAS-X) 80 MG chewable tablet Chew 80 mg by mouth every 6 (six) hours as needed for flatulence.    Historical Provider, MD  Tracheostomy Care KIT 1 kit by Does not apply route daily. 12/03/15   Erick Colace, NP  warfarin (COUMADIN) 5 MG tablet Take 5 mg by mouth as directed. Managed by Shah's office    Historical Provider, MD    Physical Exam: Filed Vitals:   01/22/16 2213 01/22/16 2218 01/22/16 2300  BP:  146/86 155/113  Pulse: 88 86 81  Temp:  98.3 F (36.8 C)   TempSrc:  Oral   Resp: 25 20  19   Height:  4' 9"  (1.448 m)   Weight:  228 lb 6.3 oz (103.6 kg)       Constitutional: Not in distress. Filed Vitals:   01/22/16 2213 01/22/16 2218 01/22/16 2300  BP:  146/86 155/113  Pulse: 88 86 81  Temp:  98.3 F (36.8 C)   TempSrc:  Oral   Resp: 25 20 19   Height:  4' 9"  (1.448 m)   Weight:  228 lb 6.3 oz (103.6 kg)    Eyes: Anicteric no pallor. ENMT: No discharge from the ears eyes nose or mouth. Neck: No JVD appreciated no mass felt tracheostomy tube in place. Respiratory: Bilateral expiratory wheeze no crepitations. Cardiovascular: S1-S2 heard. Abdomen: Soft nontender bowel sounds present. Musculoskeletal: Bilateral lower Schmidt edema present. Skin: No rash. Neurologic: Alert awake oriented to  time place and person. Moves all extremities. Psychiatric: Appears normal.   Labs on Admission: I have personally reviewed following labs and imaging studies  CBC: No results for input(s): WBC, NEUTROABS, HGB, HCT, MCV, PLT in the last 168 hours. Basic Metabolic Panel: No results for input(s): NA, K, CL, CO2, GLUCOSE, BUN, CREATININE, CALCIUM, MG, PHOS in the last 168 hours. GFR: CrCl cannot be calculated (Patient has no serum creatinine result on file.). Liver Function Tests: No results for input(s): AST, ALT, ALKPHOS, BILITOT, PROT, ALBUMIN in the last 168 hours. No results for input(s): LIPASE, AMYLASE in the last 168 hours. No results for input(s): AMMONIA in the last 168 hours. Coagulation Profile: No results for input(s): INR, PROTIME in the last 168 hours. Cardiac Enzymes: No results for input(s): CKTOTAL, CKMB, CKMBINDEX, TROPONINI in the last 168 hours. BNP (last 3 results) No results for input(s): PROBNP in the last 8760 hours. HbA1C: No results for input(s): HGBA1C in the last 72 hours. CBG: No results for input(s): GLUCAP in the last 168 hours. Lipid Profile: No results for input(s): CHOL, HDL, LDLCALC, TRIG, CHOLHDL, LDLDIRECT in the last 72  hours. Thyroid Function Tests: No results for input(s): TSH, T4TOTAL, FREET4, T3FREE, THYROIDAB in the last 72 hours. Anemia Panel: No results for input(s): VITAMINB12, FOLATE, FERRITIN, TIBC, IRON, RETICCTPCT in the last 72 hours. Urine analysis: No results found for: COLORURINE, APPEARANCEUR, LABSPEC, PHURINE, GLUCOSEU, HGBUR, BILIRUBINUR, KETONESUR, PROTEINUR, UROBILINOGEN, NITRITE, LEUKOCYTESUR Sepsis Labs: @LABRCNTIP (procalcitonin:4,lacticidven:4) )No results found for this or any previous visit (from the past 240 hour(s)).   Radiological Exams on Admission: Dg Chest Port 1 View  01/22/2016  CLINICAL DATA:  50 year old female which shortness of breath EXAM: PORTABLE CHEST 1 VIEW COMPARISON:  chest radiograph dated 01/22/2016 FINDINGS: Tracheostomy in stable position. Single portable view of the chest again demonstrates diffuse interstitial coarsening and airspace densities with no significant interval change. Right lung base surgical sutures and scarring noted. No significant pleural effusion or pneumothorax. Stable cardiomegaly. Median sternotomy wires. IMPRESSION: No significant interval change in the diffuse pulmonary opacities. Electronically Signed   By: Anner Crete M.D.   On: 01/22/2016 22:40    EKG: Independently reviewed. Normal sinus rhythm with lateral wall T-wave changes.  Assessment/Plan Principal Problem:   Acute respiratory failure with hypoxia (HCC) Active Problems:   Essential hypertension   CORONARY ARTERY BYPASS GRAFT, HX OF   OSA (obstructive sleep apnea)   COPD exacerbation (HCC)   Lupus anticoagulant disorder (HCC)   Type 2 diabetes mellitus with hyperglycemia ([place])   CAP (community acquired pneumonia)    #1. Acute respiratory failure with hypoxia requiring 100% oxygen - chest x-ray done in this facility also shows bilateral infiltrates. Patient also has lower extremity edema and also has active wheezing. Acute respiratory failure could be multifactorial.  For now I have placed patient on Levaquin for pneumonia and add Flagyl for possible aspiration patient had nausea and vomiting. On steroids nebulizer and Pulmicort for COPD and I have also placed patient on IV Lasix for CHF. CT angiogram of the chest is been ordered and I have consulted pulmonary critical care. Closely follow intake output and metabolic panel cycle cardiac markers check BNP. #2. Diabetes mellitus type 2 with hyperglycemia - patient probably received steroids and at this time blood sugars are more than 400. Check metabolic panel for any signs of DKA. I have ordered 10 units of NovoLog now and place patient on Lantus insulin 5 units at bedtime 1 dose now with sliding scale  coverage. Patient is on steroids and probably will need higher doses of insulin. #3. Lupus anticoagulant disorder - Coumadin per pharmacy #4. CAD status post CABG - since patient has acute respiratory failure we will cycle cardiac markers to rule out any MI. EKG shows some nonspecific T-wave changes in the lateral leads. Continue aspirin statins metoprolol Plavix. #5. Diastolic CHF last EF measured in February 2017 was 50-55% - see #1. I have placed patient on Lasix 40 mg IV daily. Patient usually is on 40 mg by mouth. Patient's weight is actually at [name] baseline. But patient on exam has bilateral lower extremity edema. Closely follow intake output metabolic panel respiratory status. #6. Nausea vomiting and diarrhea - check stool studies if they have any further episodes of diarrhea. Patient did have some epigastric discomfort check LFTs. If CT scan of the chest shows any evidence of cholelithiasis will order sonogram of the abdomen. #7. Hypertension - continue metoprolol lisinopril. #8. OSA status post tracheostomy. #9. Ongoing tobacco abuse - tobacco cessation counseling.   DVT prophylaxis: Coumadin. Code Status: Full code.  Family Communication: No family at the bedside.  Disposition Plan: Home.  Consults called:  Pulmonary critical care.  Admission status: Inpatient. Stepdown. Likely stay 4 days.    Rise Patience MD [facility] Pager [phone].  If 7PM-7AM, please contact night-coverage [email or web address] Password [facility]  01/22/2016, 11:42 PM

## 2016-01-23 ENCOUNTER — Inpatient Hospital Stay (HOSPITAL_COMMUNITY): Payer: Medicare Other

## 2016-01-23 DIAGNOSIS — I10 Essential (primary) hypertension: Secondary | ICD-10-CM

## 2016-01-23 DIAGNOSIS — J189 Pneumonia, unspecified organism: Secondary | ICD-10-CM

## 2016-01-23 DIAGNOSIS — J9600 Acute respiratory failure, unspecified whether with hypoxia or hypercapnia: Secondary | ICD-10-CM

## 2016-01-23 DIAGNOSIS — J9601 Acute respiratory failure with hypoxia: Secondary | ICD-10-CM

## 2016-01-23 DIAGNOSIS — J441 Chronic obstructive pulmonary disease with (acute) exacerbation: Secondary | ICD-10-CM

## 2016-01-23 DIAGNOSIS — D6862 Lupus anticoagulant syndrome: Secondary | ICD-10-CM

## 2016-01-23 DIAGNOSIS — I509 Heart failure, unspecified: Secondary | ICD-10-CM | POA: Insufficient documentation

## 2016-01-23 DIAGNOSIS — I5021 Acute systolic (congestive) heart failure: Secondary | ICD-10-CM

## 2016-01-23 LAB — COMPREHENSIVE METABOLIC PANEL
ALBUMIN: 2.7 g/dL — AB (ref 3.5–5.0)
ALBUMIN: 2.5 g/dL — AB (ref 3.5–5.0)
ALK PHOS: 98 U/L (ref 38–126)
ALT: 24 U/L (ref 14–54)
ALT: 23 U/L (ref 14–54)
ANION GAP: 12 (ref 5–15)
AST: 31 U/L (ref 15–41)
AST: 18 U/L (ref 15–41)
Alkaline Phosphatase: 87 U/L (ref 38–126)
Anion gap: 11 (ref 5–15)
BILIRUBIN TOTAL: 2.1 mg/dL — AB (ref 0.3–1.2)
BUN: 11 mg/dL (ref 6–20)
BUN: 10 mg/dL (ref 6–20)
CALCIUM: 8.4 mg/dL — AB (ref 8.9–10.3)
CHLORIDE: 92 mmol/L — AB (ref 101–111)
CO2: 29 mmol/L (ref 22–32)
CO2: 33 mmol/L — AB (ref 22–32)
CREATININE: 0.78 mg/dL (ref 0.44–1.00)
CREATININE: 0.78 mg/dL (ref 0.44–1.00)
Calcium: 8 mg/dL — ABNORMAL LOW (ref 8.9–10.3)
Chloride: 94 mmol/L — ABNORMAL LOW (ref 101–111)
GFR calc Af Amer: >60 mL/min (ref >60)
GFR calc Af Amer: >60 mL/min (ref >60)
GFR calc non Af Amer: >60 mL/min (ref >60)
GLUCOSE: 389 mg/dL — AB (ref 65–99)
GLUCOSE: 344 mg/dL — AB (ref 65–99)
POTASSIUM: 3.4 mmol/L — AB (ref 3.5–5.1)
Potassium: 4.3 mmol/L (ref 3.5–5.1)
Sodium: 135 mmol/L (ref 135–145)
Sodium: 136 mmol/L (ref 135–145)
TOTAL PROTEIN: 6.2 g/dL — AB (ref 6.5–8.1)
Total Bilirubin: 1.1 mg/dL (ref 0.3–1.2)
Total Protein: 5.8 g/dL — ABNORMAL LOW (ref 6.5–8.1)

## 2016-01-23 LAB — CBC WITH DIFFERENTIAL/PLATELET
BASOS PCT: 0 %
Basophils Absolute: 0 10*3/uL (ref 0.0–0.1)
Basophils Absolute: 0 10*3/uL (ref 0.0–0.1)
Basophils Relative: 0 %
EOS ABS: 0 10*3/uL (ref 0.0–0.7)
EOS ABS: 0 10*3/uL (ref 0.0–0.7)
EOS PCT: 0 %
EOS PCT: 0 %
HCT: 54.2 % — ABNORMAL HIGH (ref 36.0–46.0)
HCT: 51.4 % — ABNORMAL HIGH (ref 36.0–46.0)
Hemoglobin: 16.9 g/dL — ABNORMAL HIGH (ref 12.0–15.0)
Hemoglobin: 16.2 g/dL — ABNORMAL HIGH (ref 12.0–15.0)
LYMPHS ABS: 0.8 10*3/uL (ref 0.7–4.0)
LYMPHS PCT: 16 %
Lymphocytes Relative: 16 %
Lymphs Abs: 0.7 10*3/uL (ref 0.7–4.0)
MCH: 29.7 pg (ref 26.0–34.0)
MCH: 29.2 pg (ref 26.0–34.0)
MCHC: 31.2 g/dL (ref 30.0–36.0)
MCHC: 31.5 g/dL (ref 30.0–36.0)
MCV: 95.3 fL (ref 78.0–100.0)
MCV: 92.8 fL (ref 78.0–100.0)
MONO ABS: 0.1 10*3/uL (ref 0.1–1.0)
MONO ABS: 0.2 10*3/uL (ref 0.1–1.0)
MONOS PCT: 3 %
MONOS PCT: 5 %
Neutro Abs: 3.4 10*3/uL (ref 1.7–7.7)
Neutro Abs: 4 10*3/uL (ref 1.7–7.7)
Neutrophils Relative %: 81 %
Neutrophils Relative %: 79 %
PLATELETS: 171 10*3/uL (ref 150–400)
PLATELETS: 176 10*3/uL (ref 150–400)
RBC: 5.69 MIL/uL — ABNORMAL HIGH (ref 3.87–5.11)
RBC: 5.54 MIL/uL — AB (ref 3.87–5.11)
RDW: 15.5 % (ref 11.5–15.5)
RDW: 15.6 % — ABNORMAL HIGH (ref 11.5–15.5)
WBC: 4.2 10*3/uL (ref 4.0–10.5)
WBC: 5 10*3/uL (ref 4.0–10.5)

## 2016-01-23 LAB — STREP PNEUMONIAE URINARY ANTIGEN: STREP PNEUMO URINARY ANTIGEN: NEGATIVE

## 2016-01-23 LAB — URINALYSIS, ROUTINE W REFLEX MICROSCOPIC
BILIRUBIN URINE: NEGATIVE
Ketones, ur: NEGATIVE mg/dL
Leukocytes, UA: NEGATIVE
Nitrite: NEGATIVE
PROTEIN: 100 mg/dL — AB
Specific Gravity, Urine: 1.018 (ref 1.005–1.030)
pH: 6.5 (ref 5.0–8.0)

## 2016-01-23 LAB — EXPECTORATED SPUTUM ASSESSMENT W REFEX TO RESP CULTURE

## 2016-01-23 LAB — TROPONIN I
TROPONIN I: 0.03 ng/mL (ref <0.031)
TROPONIN I: 0.04 ng/mL — AB (ref <0.031)
Troponin I: 0.05 ng/mL — ABNORMAL HIGH (ref <0.031)
Troponin I: 0.04 ng/mL — ABNORMAL HIGH (ref <0.031)

## 2016-01-23 LAB — URINE MICROSCOPIC-ADD ON

## 2016-01-23 LAB — GLUCOSE, CAPILLARY
GLUCOSE-CAPILLARY: 427 mg/dL — AB (ref 65–99)
Glucose-Capillary: 346 mg/dL — ABNORMAL HIGH (ref 65–99)
Glucose-Capillary: 339 mg/dL — ABNORMAL HIGH (ref 65–99)
Glucose-Capillary: 232 mg/dL — ABNORMAL HIGH (ref 65–99)
Glucose-Capillary: 272 mg/dL — ABNORMAL HIGH (ref 65–99)

## 2016-01-23 LAB — PROTIME-INR
INR: 2.35 — ABNORMAL HIGH (ref 0.00–1.49)
PROTHROMBIN TIME: 25.5 s — AB (ref 11.6–15.2)

## 2016-01-23 LAB — EXPECTORATED SPUTUM ASSESSMENT W GRAM STAIN, RFLX TO RESP C

## 2016-01-23 LAB — PROCALCITONIN

## 2016-01-23 LAB — MRSA PCR SCREENING: MRSA by PCR: NEGATIVE

## 2016-01-23 LAB — BRAIN NATRIURETIC PEPTIDE: B NATRIURETIC PEPTIDE 5: 525.7 pg/mL — AB (ref 0.0–100.0)

## 2016-01-23 LAB — HIV ANTIBODY (ROUTINE TESTING W REFLEX): HIV SCREEN 4TH GENERATION: NONREACTIVE

## 2016-01-23 MED ORDER — PANTOPRAZOLE SODIUM 40 MG PO TBEC
40.0000 mg | DELAYED_RELEASE_TABLET | Freq: Every day | ORAL | Status: DC
  Administered 2016-01-23 – 2016-01-27 (×5): 40 mg via ORAL
  Filled 2016-01-23 (×5): qty 1

## 2016-01-23 MED ORDER — WARFARIN SODIUM 5 MG PO TABS
5.0000 mg | ORAL_TABLET | ORAL | Status: DC
  Administered 2016-01-23: 5 mg via ORAL
  Filled 2016-01-23: qty 1

## 2016-01-23 MED ORDER — LISINOPRIL 20 MG PO TABS
10.0000 mg | ORAL_TABLET | Freq: Every day | ORAL | Status: DC
  Administered 2016-01-23 – 2016-01-27 (×5): 10 mg via ORAL
  Filled 2016-01-23 (×5): qty 1

## 2016-01-23 MED ORDER — POTASSIUM CHLORIDE CRYS ER 20 MEQ PO TBCR
40.0000 meq | EXTENDED_RELEASE_TABLET | Freq: Two times a day (BID) | ORAL | Status: DC
  Administered 2016-01-23 – 2016-01-25 (×6): 40 meq via ORAL
  Filled 2016-01-23 (×6): qty 2

## 2016-01-23 MED ORDER — IPRATROPIUM-ALBUTEROL 0.5-2.5 (3) MG/3ML IN SOLN
3.0000 mL | Freq: Four times a day (QID) | RESPIRATORY_TRACT | Status: DC
  Administered 2016-01-23 – 2016-01-26 (×15): 3 mL via RESPIRATORY_TRACT
  Filled 2016-01-23 (×15): qty 3

## 2016-01-23 MED ORDER — IOPAMIDOL (ISOVUE-370) INJECTION 76%
INTRAVENOUS | Status: AC
  Administered 2016-01-23: 100 mL
  Filled 2016-01-23: qty 100

## 2016-01-23 MED ORDER — WARFARIN SODIUM 5 MG PO TABS
2.5000 mg | ORAL_TABLET | ORAL | Status: DC
  Administered 2016-01-25: 2.5 mg via ORAL
  Filled 2016-01-23: qty 1

## 2016-01-23 MED ORDER — POTASSIUM CHLORIDE CRYS ER 20 MEQ PO TBCR
40.0000 meq | EXTENDED_RELEASE_TABLET | Freq: Once | ORAL | Status: AC
  Administered 2016-01-23: 40 meq via ORAL
  Filled 2016-01-23: qty 2

## 2016-01-23 MED ORDER — FUROSEMIDE 10 MG/ML IJ SOLN
40.0000 mg | Freq: Three times a day (TID) | INTRAMUSCULAR | Status: DC
  Administered 2016-01-23 – 2016-01-27 (×13): 40 mg via INTRAVENOUS
  Filled 2016-01-23 (×14): qty 4

## 2016-01-23 MED ORDER — WARFARIN - PHARMACIST DOSING INPATIENT
Freq: Every day | Status: DC
  Administered 2016-01-23 – 2016-01-26 (×3)

## 2016-01-23 MED ORDER — BUDESONIDE 0.5 MG/2ML IN SUSP
0.2500 mg | Freq: Four times a day (QID) | RESPIRATORY_TRACT | Status: DC
  Administered 2016-01-23 – 2016-01-25 (×9): 0.25 mg via RESPIRATORY_TRACT
  Filled 2016-01-23 (×9): qty 2

## 2016-01-23 MED ORDER — INSULIN GLARGINE 100 UNIT/ML ~~LOC~~ SOLN
30.0000 [IU] | Freq: Every day | SUBCUTANEOUS | Status: DC
  Administered 2016-01-23 – 2016-01-26 (×4): 30 [IU] via SUBCUTANEOUS
  Filled 2016-01-23 (×5): qty 0.3

## 2016-01-23 MED ORDER — WARFARIN SODIUM 5 MG PO TABS: 5.0000 mg | ORAL_TABLET | ORAL | Status: DC
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
## 2016-01-23 NOTE — Progress Notes (Addendum)
ANTICOAGULATION CONSULT NOTE - Initial Consult  Pharmacy Consult for Coumadin Indication: lupus  Allergies  Allergen Reactions  . Penicillins Rash    Has patient had a PCN reaction causing immediate rash, facial/tongue/throat swelling, SOB or lightheadedness with hypotension: Yes Has patient had a PCN reaction causing severe rash involving mucus membranes or skin necrosis: No Has patient had a PCN reaction that required hospitalization No Has patient had a PCN reaction occurring within the last 10 years: No If all of the above answers are "NO", then may proceed with Cephalosporin use.   REACTION: rash    Patient Measurements: Height: 4\' 9"  (144.8 cm) Weight: 228 lb 6.3 oz (103.6 kg) IBW/kg (Calculated) : 38.6  Vital Signs: Temp: 98.2 F (36.8 C) (05/18 2356) Temp Source: Oral (05/18 2356) BP: 165/108 mmHg (05/19 0008) Pulse Rate: 81 (05/19 0008)  Labs:  Recent Labs  01/22/16 2306  HGB 16.9*  HCT 54.2*  PLT 171     Medical History: Past Medical History  Diagnosis Date  . Lupus anticoagulant disorder (HCC)     on coumadin  . Respiratory failure ([place])     s/p tracheostomy 2002  . CAD (coronary artery disease)     s/p 2V CABG in 2007  . Diabetes mellitus ([place])   . HTN (hypertension)   . Hyperlipidemia   . Cervical cancer ([place])   . CAD (coronary artery disease) of artery bypass graft 11/03/2015  . Tracheostomy in place [facility]), chronic since 2002 11/03/2015  . ST elevation (STEMI) myocardial infarction involving right coronary artery ([place]) 10/29/15    stent to VG to PDA  . CHF (congestive heart failure) ([place])   . COPD (chronic obstructive pulmonary disease) ([place])   . Endometriosis      Assessment: 50yo female tx'd from  [facility] for acute respiratory failure, to continue Coumadin for lupus coag disorder; pt had been on Coumadin in past, was changed to Eliquis in February but pt reports itching w/ Eliquis, [place] paperwork states she is on Xarelto but  pt confirms w/ me that she is on Coumadin 5mg  daily except 2.5mg  on Sundays, last dose taken Wed; [place] labs did not include PT/INR, which will be collected now by lab.  Goal of Therapy:  INR 2-3   Plan:  Will await INR prior to dosing Coumadin.  Wynona Neat, PharmD, BCPS  01/23/2016,12:39 AM    ADDENDUM: Current INR within goal at 2.35.  Will continue home Coumadin and monitor INR.         VB 01/23/2016 1:55 AM

## 2016-01-23 NOTE — Progress Notes (Signed)
[facility] PROGRESS NOTE  Theresa [name] [id number] DOB: Dec 10, 1965 DOA: 01/22/2016   PCP: Monico Blitz, MD     Assessment/Plan: Principal Problem:   Acute respiratory failure with hypoxia ([place]) Active Problems:   Essential hypertension   CORONARY ARTERY BYPASS GRAFT, HX OF   OSA (obstructive sleep apnea)   COPD exacerbation (HCC)   Lupus anticoagulant disorder ([place])   Type 2 diabetes mellitus with hyperglycemia ([place])   CAP (community acquired pneumonia)   CHF (congestive heart failure) ([place])   Theresa [name] is a 50 y.o. female with medical history significant of CAD status post CABG, OSA on tracheostomy, COPD, ongoing tobacco abuse, diastolic CHF, diabetes mellitus and lupus anticoagulants on Coumadin started experiencing shortness of breath with productive cough and wheezing this morning after she woke up. Patient also had 3 episodes of nausea vomiting and diarrhea. Had mild epigastric discomfort. Since patient wasn't wheezing severely and since patient may require further assistance with pulmonary team patient was transferred to [facility] for further management. On my exam patient is actively wheezing but denies any chest pain fever chills has some productive cough. Abdomen appears benign. Chest x-ray shows right apical infiltrates and on exam patient also has lower extremity edema. Patient is on 100% oxygen.   Assessment and plan 1. Acute respiratory failure with hypoxia requiring 100% oxygen - chest x-ray shows multiple areas of airspace disease and consolidation. Included with COPD exacerbation and acute on chronic diastolic heart failure. No PE, INR therapeutic on admission. Likely  multifactorial. Continue Levaquin and Flagyl for possible aspiration patient had nausea and vomiting. On steroids nebulizer and Pulmicort for COPD and  on IV Lasix for CHF. CT angiogram of the chest is been ordered and I have consulted pulmonary critical care. Closely follow  intake output and metabolic panel cycle cardiac markers check BNP.if neeeded  PCCM would change to 4 cuffed,  #2. Diabetes mellitus type 2 with hyperglycemia -last hemoglobin A1c 8.7. patient probably received steroids and at this time blood sugars are more than 400. Check metabolic panel for any signs of DKA. I have ordered 10 units of NovoLog now and increase Lantus to 30 units today. Continue sliding scale coverage. Patient is on steroids and probably will need higher doses of insulin.  #3. Lupus anticoagulant disorder - Coumadin per pharmacy  #4. CAD status post CABG - since patient has acute respiratory failure we will cycle cardiac markers to rule out any MI. EKG shows some nonspecific T-wave changes in the lateral leads. Continue aspirin statins metoprolol Plavix.  #5. Acute on chronic diastolic heart failure, last EF measured in February 2017 was 50-55% - see #1. On Lasix 40 mg IV every 8. Patient usually is on 40 mg by mouth. Patient's weight is actually at her baseline. But patient on exam has bilateral lower extremity edema. Closely follow intake output metabolic panel respiratory status.  #6. Nausea vomiting and diarrhea - check stool studies if they have any further episodes of diarrhea. Patient did have some epigastric discomfort check LFTs. If CT scan of the chest shows any evidence of cholelithiasis will order sonogram of the abdomen.  #7. Hypertension - continue metoprolol lisinopril. #8. OSA status post tracheostomy. #9. Ongoing tobacco abuse - tobacco cessation counseling.    DVT prophylaxsis Coumadin  Code Status:  Full code   Family Communication: Discussed in detail with the patient, all imaging results, lab results explained to the patient   Disposition Plan: Continue stepdown  Consultants:  Critical care  Procedures:  None  Antibiotics: Anti-infectives    Start     Dose/Rate Route Frequency Ordered Stop   01/23/16 0030  metroNIDAZOLE (FLAGYL) IVPB 500  mg     500 mg 100 mL/hr over 60 Minutes Intravenous Every 8 hours 01/22/16 2340     01/23/16 0030  levofloxacin (LEVAQUIN) IVPB 750 mg     750 mg 100 mL/hr over 90 Minutes Intravenous Daily at bedtime 01/22/16 2340 01/27/16 2159         HPI/Subjective:  no SOB ,able to speak in full sentences    Objective: Filed Vitals:   01/23/16 0614 01/23/16 0700 01/23/16 0733 01/23/16 0811  BP:  138/87    Pulse:  66 73   Temp:  98.5 F (36.9 C)    TempSrc:  Oral    Resp:  23 24   Height:      Weight:      SpO2: 94% 93% 93% 88%    Intake/Output Summary (Last 24 hours) at 01/23/16 0820 Last data filed at 01/23/16 0745  Gross per 24 hour  Intake    350 ml  Output   3300 ml  Net  -2950 ml    Exam:  Examination:  General exam: Appears calm and comfortable  Respiratory system: Clear to auscultation. Respiratory effort normal. Cardiovascular system: S1 & S2 heard, RRR. No JVD, murmurs, rubs, gallops or clicks. No pedal edema. Gastrointestinal system: Abdomen is nondistended, soft and nontender. No organomegaly or masses felt. Normal bowel sounds heard. Central nervous system: Alert and oriented. No focal neurological deficits. Extremities: Symmetric 5 x 5 power. Skin: No rashes, lesions or ulcers Psychiatry: Judgement and insight appear normal. Mood & affect appropriate.     Data Reviewed: I have personally reviewed following labs and imaging studies  Micro Results Recent Results (from the past 240 hour(s))  MRSA PCR Screening     Status: None   Collection Time: 01/22/16 10:15 PM  Result Value Ref Range Status   MRSA by PCR NEGATIVE NEGATIVE Final    Comment:        The GeneXpert MRSA Assay (FDA approved for NASAL specimens only), is one component of a comprehensive MRSA colonization surveillance program. It is not intended to diagnose MRSA infection nor to guide or monitor treatment for MRSA infections.   Culture, sputum-assessment     Status: None   Collection  Time: 01/23/16  2:02 AM  Result Value Ref Range Status   Specimen Description EXPECTORATED SPUTUM  Final   Special Requests NONE  Final   Sputum evaluation   Final    THIS SPECIMEN IS ACCEPTABLE. RESPIRATORY CULTURE REPORT TO FOLLOW.   Report Status 01/23/2016 FINAL  Final    Radiology Reports Ct Angio Chest Pe W/cm &/or Wo Cm  01/23/2016  CLINICAL DATA:  Shortness of breath.  Respiratory failure. EXAM: CT ANGIOGRAPHY CHEST WITH CONTRAST TECHNIQUE: Multidetector CT imaging of the chest was performed using the standard protocol during bolus administration of intravenous contrast. Multiplanar CT image reconstructions and MIPs were obtained to evaluate the vascular anatomy. CONTRAST:  100 mL Isovue 370 COMPARISON:  None. FINDINGS: Technically adequate study with good opacification of the central and segmental pulmonary arteries. No focal filling defect. No evidence of significant pulmonary embolus. Mild cardiac enlargement. Reflux of contrast material into the liver suggesting right heart failure. Normal caliber thoracic aorta. Postoperative changes in the mediastinum. Esophagus is decompressed. Prominent right paratracheal lymph nodes measure up to about 1.8 cm  AP diameter. Suggestion of wall thickening versus prominent hilar lymph nodes around the mainstem bronchus bilaterally. Appearance is nonspecific but could be due to reactive lymphadenopathy. Tracheostomy. Evaluation of the lungs is limited due to motion artifact. There are diffuse linear infiltrates and diffuse mosaic pattern demonstrated throughout both lungs. This could represent multifocal pneumonia or edema. No pleural effusions. No pneumothorax. Included portions of the upper abdominal organs demonstrate 8 2.6 cm diameter left adrenal gland nodule with fat density measurements consistent with a benign adenoma. Cholelithiasis. Degenerative changes in the spine. No destructive bone lesions. Review of the MIP images confirms the above findings.  IMPRESSION: No evidence of significant pulmonary embolus. Cardiac enlargement with evidence of right heart failure. Airspace disease with multiple focal areas of consolidation in both lungs suggesting edema or pneumonia. Left adrenal gland adenoma. Cholelithiasis. Electronically Signed   By: Lucienne Capers M.D.   On: 01/23/2016 04:39   Dg Chest Port 1 View  01/22/2016  CLINICAL DATA:  50 year old female which shortness of breath EXAM: PORTABLE CHEST 1 VIEW COMPARISON:  chest radiograph dated 01/22/2016 FINDINGS: Tracheostomy in stable position. Single portable view of the chest again demonstrates diffuse interstitial coarsening and airspace densities with no significant interval change. Right lung base surgical sutures and scarring noted. No significant pleural effusion or pneumothorax. Stable cardiomegaly. Median sternotomy wires. IMPRESSION: No significant interval change in the diffuse pulmonary opacities. Electronically Signed   By: Anner Crete M.D.   On: 01/22/2016 22:40     CBC  Recent Labs Lab 01/22/16 2306 01/23/16 0438  WBC 4.2 5.0  HGB 16.9* 16.2*  HCT 54.2* 51.4*  PLT 171 176  MCV 95.3 92.8  MCH 29.7 29.2  MCHC 31.2 31.5  RDW 15.5 15.6*  LYMPHSABS 0.7 0.8  MONOABS 0.1 0.2  EOSABS 0.0 0.0  BASOSABS 0.0 0.0    Chemistries   Recent Labs Lab 01/22/16 2306 01/23/16 0438  NA 135 136  K 4.3 3.4*  CL 94* 92*  CO2 29 33*  GLUCOSE 389* 344*  BUN 11 10  CREATININE 0.78 0.78  CALCIUM 8.4* 8.0*  AST 31 18  ALT 24 23  ALKPHOS 98 87  BILITOT 2.1* 1.1   ------------------------------------------------------------------------------------------------------------------ estimated creatinine clearance is 85.8 mL/min (by C-G formula based on Cr of 0.78). ------------------------------------------------------------------------------------------------------------------ No results for input(s): HGBA1C in the last 72  hours. ------------------------------------------------------------------------------------------------------------------ No results for input(s): CHOL, HDL, LDLCALC, TRIG, CHOLHDL, LDLDIRECT in the last 72 hours. ------------------------------------------------------------------------------------------------------------------ No results for input(s): TSH, T4TOTAL, T3FREE, THYROIDAB in the last 72 hours.  Invalid input(s): FREET3 ------------------------------------------------------------------------------------------------------------------ No results for input(s): VITAMINB12, FOLATE, FERRITIN, TIBC, IRON, RETICCTPCT in the last 72 hours.  Coagulation profile  Recent Labs Lab 01/23/16 0043  INR 2.35*    No results for input(s): DDIMER in the last 72 hours.  Cardiac Enzymes  Recent Labs Lab 01/22/16 2306 01/23/16 0521  TROPONINI 0.05* 0.03   ------------------------------------------------------------------------------------------------------------------ Invalid input(s): POCBNP   CBG:  Recent Labs Lab 01/23/16 0750  GLUCAP 346*       Studies: Ct Angio Chest Pe W/cm &/or Wo Cm  01/23/2016  CLINICAL DATA:  Shortness of breath.  Respiratory failure. EXAM: CT ANGIOGRAPHY CHEST WITH CONTRAST TECHNIQUE: Multidetector CT imaging of the chest was performed using the standard protocol during bolus administration of intravenous contrast. Multiplanar CT image reconstructions and MIPs were obtained to evaluate the vascular anatomy. CONTRAST:  100 mL Isovue 370 COMPARISON:  None. FINDINGS: Technically adequate study with good opacification of the central and segmental pulmonary arteries.  No focal filling defect. No evidence of significant pulmonary embolus. Mild cardiac enlargement. Reflux of contrast material into the liver suggesting right heart failure. Normal caliber thoracic aorta. Postoperative changes in the mediastinum. Esophagus is decompressed. Prominent right paratracheal  lymph nodes measure up to about 1.8 cm AP diameter. Suggestion of wall thickening versus prominent hilar lymph nodes around the mainstem bronchus bilaterally. Appearance is nonspecific but could be due to reactive lymphadenopathy. Tracheostomy. Evaluation of the lungs is limited due to motion artifact. There are diffuse linear infiltrates and diffuse mosaic pattern demonstrated throughout both lungs. This could represent multifocal pneumonia or edema. No pleural effusions. No pneumothorax. Included portions of the upper abdominal organs demonstrate 8 2.6 cm diameter left adrenal gland nodule with fat density measurements consistent with a benign adenoma. Cholelithiasis. Degenerative changes in the spine. No destructive bone lesions. Review of the MIP images confirms the above findings. IMPRESSION: No evidence of significant pulmonary embolus. Cardiac enlargement with evidence of right heart failure. Airspace disease with multiple focal areas of consolidation in both lungs suggesting edema or pneumonia. Left adrenal gland adenoma. Cholelithiasis. Electronically Signed   By: Lucienne Capers M.D.   On: 01/23/2016 04:39   Dg Chest Port 1 View  01/22/2016  CLINICAL DATA:  50 year old female which shortness of breath EXAM: PORTABLE CHEST 1 VIEW COMPARISON:  chest radiograph dated 01/22/2016 FINDINGS: Tracheostomy in stable position. Single portable view of the chest again demonstrates diffuse interstitial coarsening and airspace densities with no significant interval change. Right lung base surgical sutures and scarring noted. No significant pleural effusion or pneumothorax. Stable cardiomegaly. Median sternotomy wires. IMPRESSION: No significant interval change in the diffuse pulmonary opacities. Electronically Signed   By: Anner Crete M.D.   On: 01/22/2016 22:40      Lab Results  Component Value Date   HGBA1C 8.7* 10/31/2015   Lab Results  Component Value Date   LDLCALC 134* 10/30/2015   CREATININE  0.78 01/23/2016       Scheduled Meds: . antiseptic oral rinse  7 mL Mouth Rinse q12n4p  . aspirin  81 mg Oral Daily  . atorvastatin  80 mg Oral q1800  . budesonide  0.25 mg Nebulization Q6H  . chlorhexidine  15 mL Mouth Rinse BID  . clopidogrel  75 mg Oral Daily  . diazepam  5 mg Oral QHS  . furosemide  40 mg Intravenous Q8H  . gabapentin  300 mg Oral BID  . insulin aspart  0-9 Units Subcutaneous TID WC  . insulin glargine  5 Units Subcutaneous QHS  . ipratropium-albuterol  3 mL Nebulization Q6H  . levofloxacin (LEVAQUIN) IV  750 mg Intravenous QHS  . lisinopril  20 mg Oral Daily  . methylPREDNISolone (SOLU-MEDROL) injection  40 mg Intravenous Q12H  . metoprolol  50 mg Oral BID  . metronidazole  500 mg Intravenous Q8H  . potassium chloride  40 mEq Oral Once  . [START ON 01/25/2016] warfarin  2.5 mg Oral Q Sun-1800  . warfarin  5 mg Oral Once per day on Mon Tue Wed Thu Fri Sat  . Warfarin - Pharmacist Dosing Inpatient   Does not apply q1800   Continuous Infusions:    LOS: 1 day    Time spent: >30 MINS    [facility] Pager [phone]. If 7PM-7AM, please contact night-coverage at [email or web address], password [facility] 01/23/2016, 8:20 AM  LOS: 1 day

## 2016-01-23 NOTE — Progress Notes (Signed)
Per a consult to creat an Advance Directive, Chaplain presented to the patient's room, introduced self and explained reason for spiritual care support to assist with completing Directive as needed.  Per the patient request the Directive was left with the patient for review, she will call if needed for assistance. Chaplain Yaakov Guthrie [phone]

## 2016-01-23 NOTE — Evaluation (Signed)
Clinical/Bedside Swallow Evaluation Patient Details  Name: Theresa [name] MRN: [id number] Date of Birth: 10-12-1965  Today's Date: 01/23/2016 Time: SLP Start Time (ACUTE ONLY): 1100 SLP Stop Time (ACUTE ONLY): 1115 SLP Time Calculation (min) (ACUTE ONLY): 15 min  Past Medical History:  Past Medical History  Diagnosis Date  . Lupus anticoagulant disorder (HCC)     on coumadin  . Respiratory failure ([place])     s/p tracheostomy 2002  . CAD (coronary artery disease)     s/p 2V CABG in 2007  . Diabetes mellitus ([place])   . HTN (hypertension)   . Hyperlipidemia   . Cervical cancer ([place])   . CAD (coronary artery disease) of artery bypass graft 11/03/2015  . Tracheostomy in place [facility] chronic since 2002 11/03/2015  . ST elevation (STEMI) myocardial infarction involving right coronary artery ([place]) 10/29/15    stent to VG to PDA  . CHF (congestive heart failure) ([place])   . COPD (chronic obstructive pulmonary disease) ([place])   . Endometriosis    Past Surgical History:  Past Surgical History  Procedure Laterality Date  . Coronary artery bypass graft  2007    2V  . Carotid stent    . Tracheostomy    . Cesarean section with bilateral tubal ligation    . Cardiac catheterization N/A 10/29/2015    Procedure: Left Heart Cath and Cors/Grafts Angiography;  Surgeon: Burnell Blanks, MD;  Location: [facility];  Service: Cardiovascular;  Laterality: N/A;  . Cardiac catheterization  10/29/2015    Procedure: Coronary Stent Intervention;  Surgeon: Burnell Blanks, MD;  Location: [facility];  Service: Cardiovascular;;   HPI:  50 year old female admitted 01/22/16 due to SOB. PMH significant for trach (2002), COPD. STEMI,  CXR reveals no significant changes.   Assessment / Plan / Recommendation Clinical Impression  Pt presents with adequate oral motor strength and function. No overt s/s aspiration observed or reported, no difficulty swallowing prior to admit. ST to sign off at  this time. Please reconsult if needs arise.    Aspiration Risk  Mild aspiration risk    Diet Recommendation Regular;Thin liquid   Liquid Administration via: Cup Medication Administration: Whole meds with liquid Supervision: Patient able to self feed Compensations: Minimize environmental distractions;Slow rate;Small sips/bites Postural Changes: Seated upright at 90 degrees    Other  Recommendations Oral Care Recommendations: Oral care BID       Prognosis Prognosis for Safe Diet Advancement: Good      Swallow Study   General Date of Onset: 01/22/16 HPI: 50 year old female admitted 01/22/16 due to SOB. PMH significant for trach (2002), COPD. STEMI,  CXR reveals no significant changes. Type of Study: Bedside Swallow Evaluation Previous Swallow Assessment: none found Diet Prior to this Study: Regular;Thin liquids Temperature Spikes Noted: No Respiratory Status: Trach;Trach Collar Trach Size and Type: With PMSV not in place;Uncuffed (silver Glennon Mac) History of Recent Intubation: No Behavior/Cognition: Alert;Cooperative;Pleasant mood Oral Cavity Assessment: Within Functional Limits Oral Care Completed by SLP: No (pt eating breakfast when SLP entered) Oral Cavity - Dentition: Missing dentition;Adequate natural dentition Vision: Functional for self-feeding Self-Feeding Abilities: Able to feed self Patient Positioning: Upright in bed Baseline Vocal Quality: Normal Volitional Cough: Strong Volitional Swallow: Able to elicit    Oral/Motor/Sensory Function Overall Oral Motor/Sensory Function: Within functional limits   Ice Chips Ice chips: Not tested   Thin Liquid Thin Liquid: Within functional limits Presentation: Cup    Nectar Thick Nectar Thick Liquid:  Not tested   Honey Thick Honey Thick Liquid: Not tested   Puree Puree: Not tested   Solid   Celia B. Bueche, [facility], CCC-SLP [id number]  Solid: Within functional limits Presentation: Self Fed;Spoon        Shonna Chock 01/23/2016,11:15 AM

## 2016-01-24 ENCOUNTER — Inpatient Hospital Stay (HOSPITAL_COMMUNITY): Payer: Medicare Other

## 2016-01-24 DIAGNOSIS — Z951 Presence of aortocoronary bypass graft: Secondary | ICD-10-CM

## 2016-01-24 DIAGNOSIS — I5020 Unspecified systolic (congestive) heart failure: Secondary | ICD-10-CM

## 2016-01-24 DIAGNOSIS — E1165 Type 2 diabetes mellitus with hyperglycemia: Secondary | ICD-10-CM

## 2016-01-24 DIAGNOSIS — G4733 Obstructive sleep apnea (adult) (pediatric): Secondary | ICD-10-CM

## 2016-01-24 DIAGNOSIS — I509 Heart failure, unspecified: Secondary | ICD-10-CM

## 2016-01-24 DIAGNOSIS — R112 Nausea with vomiting, unspecified: Secondary | ICD-10-CM | POA: Insufficient documentation

## 2016-01-24 LAB — COMPREHENSIVE METABOLIC PANEL
ALT: 22 U/L (ref 14–54)
AST: 18 U/L (ref 15–41)
Albumin: 2.7 g/dL — ABNORMAL LOW (ref 3.5–5.0)
Alkaline Phosphatase: 67 U/L (ref 38–126)
Anion gap: 12 (ref 5–15)
BILIRUBIN TOTAL: 1.2 mg/dL (ref 0.3–1.2)
BUN: 18 mg/dL (ref 6–20)
CALCIUM: 8.2 mg/dL — AB (ref 8.9–10.3)
CHLORIDE: 92 mmol/L — AB (ref 101–111)
CO2: 31 mmol/L (ref 22–32)
CREATININE: 0.74 mg/dL (ref 0.44–1.00)
Glucose, Bld: 330 mg/dL — ABNORMAL HIGH (ref 65–99)
Potassium: 4.5 mmol/L (ref 3.5–5.1)
Sodium: 135 mmol/L (ref 135–145)
TOTAL PROTEIN: 6.1 g/dL — AB (ref 6.5–8.1)

## 2016-01-24 LAB — CBC
HCT: 53.4 % — ABNORMAL HIGH (ref 36.0–46.0)
Hemoglobin: 16.9 g/dL — ABNORMAL HIGH (ref 12.0–15.0)
MCH: 29.9 pg (ref 26.0–34.0)
MCHC: 31.6 g/dL (ref 30.0–36.0)
MCV: 94.3 fL (ref 78.0–100.0)
PLATELETS: 208 10*3/uL (ref 150–400)
RBC: 5.66 MIL/uL — AB (ref 3.87–5.11)
RDW: 15.7 % — AB (ref 11.5–15.5)
WBC: 11.7 10*3/uL — AB (ref 4.0–10.5)

## 2016-01-24 LAB — GLUCOSE, CAPILLARY
GLUCOSE-CAPILLARY: 269 mg/dL — AB (ref 65–99)
GLUCOSE-CAPILLARY: 404 mg/dL — AB (ref 65–99)
GLUCOSE-CAPILLARY: 443 mg/dL — AB (ref 65–99)
Glucose-Capillary: 509 mg/dL — ABNORMAL HIGH (ref 65–99)

## 2016-01-24 LAB — PHOSPHORUS: Phosphorus: 3.5 mg/dL (ref 2.5–4.6)

## 2016-01-24 LAB — MAGNESIUM: Magnesium: 1.5 mg/dL — ABNORMAL LOW (ref 1.7–2.4)

## 2016-01-24 LAB — HEMOGLOBIN A1C
Hgb A1c MFr Bld: 13.2 % — ABNORMAL HIGH (ref 4.8–5.6)
MEAN PLASMA GLUCOSE: 332 mg/dL

## 2016-01-24 LAB — PROCALCITONIN

## 2016-01-24 LAB — PROTIME-INR
INR: 3.16 — AB (ref 0.00–1.49)
PROTHROMBIN TIME: 31.8 s — AB (ref 11.6–15.2)

## 2016-01-24 MED ORDER — MAGNESIUM SULFATE 2 GM/50ML IV SOLN
2.0000 g | Freq: Once | INTRAVENOUS | Status: AC
  Administered 2016-01-24: 2 g via INTRAVENOUS
  Filled 2016-01-24: qty 50

## 2016-01-24 MED ORDER — INSULIN ASPART 100 UNIT/ML ~~LOC~~ SOLN
3.0000 [IU] | Freq: Once | SUBCUTANEOUS | Status: AC
  Administered 2016-01-24: 3 [IU] via SUBCUTANEOUS

## 2016-01-24 MED ORDER — INSULIN ASPART 100 UNIT/ML ~~LOC~~ SOLN
0.0000 [IU] | Freq: Three times a day (TID) | SUBCUTANEOUS | Status: DC
  Administered 2016-01-25 (×2): 8 [IU] via SUBCUTANEOUS
  Administered 2016-01-26: 15 [IU] via SUBCUTANEOUS
  Administered 2016-01-26: 5 [IU] via SUBCUTANEOUS
  Administered 2016-01-27: 2 [IU] via SUBCUTANEOUS

## 2016-01-24 MED ORDER — INSULIN ASPART 100 UNIT/ML ~~LOC~~ SOLN
8.0000 [IU] | Freq: Once | SUBCUTANEOUS | Status: AC
  Administered 2016-01-24: 8 [IU] via SUBCUTANEOUS

## 2016-01-24 MED ORDER — INSULIN ASPART 100 UNIT/ML ~~LOC~~ SOLN
0.0000 [IU] | Freq: Every day | SUBCUTANEOUS | Status: DC
  Administered 2016-01-25: 3 [IU] via SUBCUTANEOUS
  Administered 2016-01-26: 2 [IU] via SUBCUTANEOUS

## 2016-01-24 MED ORDER — PREDNISONE 20 MG PO TABS
40.0000 mg | ORAL_TABLET | Freq: Every day | ORAL | Status: DC
  Administered 2016-01-25 – 2016-01-27 (×3): 40 mg via ORAL
  Filled 2016-01-24 (×3): qty 2

## 2016-01-24 MED ORDER — INSULIN ASPART 100 UNIT/ML ~~LOC~~ SOLN: 6.0000 [IU] | Freq: Once | SUBCUTANEOUS | Status: DC

## 2016-01-24 NOTE — Progress Notes (Signed)
ANTICOAGULATION CONSULT NOTE - Follow Up Consult  Pharmacy Consult for Coumadin Indication: Lupus anticoagulant  Allergies  Allergen Reactions  . Penicillins Rash    Has patient had a PCN reaction causing immediate rash, facial/tongue/throat swelling, SOB or lightheadedness with hypotension: Yes Has patient had a PCN reaction causing severe rash involving mucus membranes or skin necrosis: No Has patient had a PCN reaction that required hospitalization No Has patient had a PCN reaction occurring within the last 10 years: No If all of the above answers are "NO", then may proceed with Cephalosporin use.   REACTION: rash    Patient Measurements: Height: 4\' 9"  (144.8 cm) Weight: 222 lb 14.2 oz (101.1 kg) IBW/kg (Calculated) : 38.6 Heparin Dosing Weight:   Vital Signs: Temp: 97.7 F (36.5 C) (05/20 1255) Temp Source: Oral (05/20 1255) BP: 106/61 mmHg (05/20 0957) Pulse Rate: 57 (05/20 1255)  Labs:  Recent Labs  01/22/16 2306 01/23/16 0043 01/23/16 0438 01/23/16 0521 01/23/16 1119 01/23/16 1655 01/24/16 0318  HGB 16.9*  --  16.2*  --   --   --  16.9*  HCT 54.2*  --  51.4*  --   --   --  53.4*  PLT 171  --  176  --   --   --  208  LABPROT  --  25.5*  --   --   --   --  31.8*  INR  --  2.35*  --   --   --   --  3.16*  CREATININE 0.78  --  0.78  --   --   --  0.74  TROPONINI 0.05*  --   --  0.03 0.04* 0.04*  --     Estimated Creatinine Clearance: 84.5 mL/min (by C-G formula based on Cr of 0.74).   Medications:  Scheduled:  . antiseptic oral rinse  7 mL Mouth Rinse q12n4p  . aspirin  81 mg Oral Daily  . atorvastatin  80 mg Oral q1800  . budesonide  0.25 mg Nebulization Q6H  . chlorhexidine  15 mL Mouth Rinse BID  . clopidogrel  75 mg Oral Daily  . diazepam  5 mg Oral QHS  . furosemide  40 mg Intravenous Q8H  . gabapentin  300 mg Oral BID  . insulin aspart  0-9 Units Subcutaneous TID WC  . insulin glargine  30 Units Subcutaneous QHS  . ipratropium-albuterol  3  mL Nebulization Q6H  . levofloxacin (LEVAQUIN) IV  750 mg Intravenous QHS  . lisinopril  10 mg Oral Daily  . magnesium sulfate 1 - 4 g bolus IVPB  2 g Intravenous Once  . metoprolol  50 mg Oral BID  . metronidazole  500 mg Intravenous Q8H  . pantoprazole  40 mg Oral Daily  . potassium chloride  40 mEq Oral BID  . [START ON 01/25/2016] predniSONE  40 mg Oral Q breakfast  . [START ON 01/25/2016] warfarin  2.5 mg Oral Q Sun-1800  . warfarin  5 mg Oral Once per day on Mon Tue Wed Thu Fri Sat  . Warfarin - Pharmacist Dosing Inpatient   Does not apply q1800    Assessment: 50yo female on Coumadin for Lupus AC.  CT (-)PE this admission.  INR increased today, likely due to Metronidazole and Levofloxacin increasing Coumadin effect.  No bleeding noted.  Goal of Therapy:  INR 2-3 Monitor platelets by anticoagulation protocol: Yes   Plan:  No Coumadin today Daily INR Watch for s/s of bleeding  Gracy Bruins,  PharmD [facility]
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
## 2016-01-25 ENCOUNTER — Inpatient Hospital Stay (HOSPITAL_COMMUNITY): Payer: Medicare Other

## 2016-01-25 DIAGNOSIS — I509 Heart failure, unspecified: Secondary | ICD-10-CM

## 2016-01-25 LAB — BASIC METABOLIC PANEL
Anion gap: 10 (ref 5–15)
BUN: 22 mg/dL — AB (ref 6–20)
CO2: 37 mmol/L — ABNORMAL HIGH (ref 22–32)
Calcium: 8.5 mg/dL — ABNORMAL LOW (ref 8.9–10.3)
Chloride: 90 mmol/L — ABNORMAL LOW (ref 101–111)
Creatinine, Ser: 0.93 mg/dL (ref 0.44–1.00)
Glucose, Bld: 270 mg/dL — ABNORMAL HIGH (ref 65–99)
POTASSIUM: 4.2 mmol/L (ref 3.5–5.1)
SODIUM: 137 mmol/L (ref 135–145)

## 2016-01-25 LAB — LEGIONELLA PNEUMOPHILA SEROGP 1 UR AG: L. PNEUMOPHILA SEROGP 1 UR AG: NEGATIVE

## 2016-01-25 LAB — ECHOCARDIOGRAM LIMITED
Height: 57 in
WEIGHTICAEL: 3580.27 [oz_av]

## 2016-01-25 LAB — GLUCOSE, CAPILLARY
GLUCOSE-CAPILLARY: 108 mg/dL — AB (ref 65–99)
GLUCOSE-CAPILLARY: 449 mg/dL — AB (ref 65–99)
Glucose-Capillary: 289 mg/dL — ABNORMAL HIGH (ref 65–99)
Glucose-Capillary: 274 mg/dL — ABNORMAL HIGH (ref 65–99)
Glucose-Capillary: 266 mg/dL — ABNORMAL HIGH (ref 65–99)

## 2016-01-25 LAB — CBC
HEMATOCRIT: 53.7 % — AB (ref 36.0–46.0)
Hemoglobin: 16.9 g/dL — ABNORMAL HIGH (ref 12.0–15.0)
MCH: 29.9 pg (ref 26.0–34.0)
MCHC: 31.5 g/dL (ref 30.0–36.0)
MCV: 95 fL (ref 78.0–100.0)
PLATELETS: 224 10*3/uL (ref 150–400)
RBC: 5.65 MIL/uL — AB (ref 3.87–5.11)
RDW: 15.8 % — AB (ref 11.5–15.5)
WBC: 12.3 10*3/uL — AB (ref 4.0–10.5)

## 2016-01-25 LAB — MAGNESIUM: Magnesium: 1.9 mg/dL (ref 1.7–2.4)

## 2016-01-25 LAB — PROTIME-INR
INR: 2.72 — ABNORMAL HIGH (ref 0.00–1.49)
PROTHROMBIN TIME: 28.5 s — AB (ref 11.6–15.2)

## 2016-01-25 LAB — PROCALCITONIN: Procalcitonin: <0.1 ng/mL

## 2016-01-25 MED ORDER — INSULIN ASPART 100 UNIT/ML ~~LOC~~ SOLN
6.0000 [IU] | Freq: Once | SUBCUTANEOUS | Status: AC
  Administered 2016-01-25: 6 [IU] via SUBCUTANEOUS

## 2016-01-25 MED ORDER — BUDESONIDE 0.5 MG/2ML IN SUSP
0.2500 mg | Freq: Two times a day (BID) | RESPIRATORY_TRACT | Status: DC
  Administered 2016-01-25 – 2016-01-27 (×3): 0.25 mg via RESPIRATORY_TRACT
  Filled 2016-01-25 (×4): qty 2

## 2016-01-25 MED ORDER — WARFARIN SODIUM 5 MG PO TABS
2.5000 mg | ORAL_TABLET | Freq: Once | ORAL | Status: AC
  Administered 2016-01-25: 2.5 mg via ORAL
  Filled 2016-01-25: qty 1

## 2016-01-25 NOTE — Progress Notes (Signed)
  Echocardiogram 2D Echocardiogram has been performed.  Theresa [name] 01/25/2016, 11:30 AM

## 2016-01-25 NOTE — Progress Notes (Signed)
ANTICOAGULATION CONSULT NOTE - Follow Up Consult  Pharmacy Consult for Coumadin Indication: Lupus anticoagulant  Allergies  Allergen Reactions  . Penicillins Rash    Has patient had a PCN reaction causing immediate rash, facial/tongue/throat swelling, SOB or lightheadedness with hypotension: Yes Has patient had a PCN reaction causing severe rash involving mucus membranes or skin necrosis: No Has patient had a PCN reaction that required hospitalization No Has patient had a PCN reaction occurring within the last 10 years: No If all of the above answers are "NO", then may proceed with Cephalosporin use.   REACTION: rash    Patient Measurements: Height: 4\' 9"  (144.8 cm) Weight: 223 lb 12.3 oz (101.5 kg) IBW/kg (Calculated) : 38.6 Heparin Dosing Weight:   Vital Signs: Temp: 98.5 F (36.9 C) (05/21 0740) Temp Source: Oral (05/21 0740) BP: 126/85 mmHg (05/21 0839) Pulse Rate: 61 (05/21 0839)  Labs:  Recent Labs  01/23/16 0043 01/23/16 0438 01/23/16 0521 01/23/16 1119 01/23/16 1655 01/24/16 0318 01/25/16 0256  HGB  --  16.2*  --   --   --  16.9* 16.9*  HCT  --  51.4*  --   --   --  53.4* 53.7*  PLT  --  176  --   --   --  208 224  LABPROT 25.5*  --   --   --   --  31.8* 28.5*  INR 2.35*  --   --   --   --  3.16* 2.72*  CREATININE  --  0.78  --   --   --  0.74 0.93  TROPONINI  --   --  0.03 0.04* 0.04*  --   --     Estimated Creatinine Clearance: 72.9 mL/min (by C-G formula based on Cr of 0.93).   Medications:  Scheduled:  . antiseptic oral rinse  7 mL Mouth Rinse q12n4p  . aspirin  81 mg Oral Daily  . atorvastatin  80 mg Oral q1800  . budesonide  0.25 mg Nebulization Q6H  . chlorhexidine  15 mL Mouth Rinse BID  . clopidogrel  75 mg Oral Daily  . diazepam  5 mg Oral QHS  . furosemide  40 mg Intravenous Q8H  . gabapentin  300 mg Oral BID  . insulin aspart  0-15 Units Subcutaneous TID WC  . insulin aspart  0-5 Units Subcutaneous QHS  . insulin glargine  30  Units Subcutaneous QHS  . ipratropium-albuterol  3 mL Nebulization Q6H  . levofloxacin (LEVAQUIN) IV  750 mg Intravenous QHS  . lisinopril  10 mg Oral Daily  . metoprolol  50 mg Oral BID  . metronidazole  500 mg Intravenous Q8H  . pantoprazole  40 mg Oral Daily  . potassium chloride  40 mEq Oral BID  . predniSONE  40 mg Oral Q breakfast  . warfarin  2.5 mg Oral Q Sun-1800  . warfarin  5 mg Oral Once per day on Mon Tue Wed Thu Fri Sat  . Warfarin - Pharmacist Dosing Inpatient   Does not apply q1800    Assessment: 50yo female on Coumadin for Lupus AC. CT (-)PE this admission. INR wnl today after held dose.   On Metronidazole and Levofloxacin which may increase Coumadin effect. No bleeding noted.  Goal of Therapy:  INR 2-3 Monitor platelets by anticoagulation protocol: Yes   Plan:  Coumadin 2.5mg  Daily INR Watch for s/s of bleeding  Gracy Bruins, PharmD [facility]
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
## 2016-01-26 LAB — BASIC METABOLIC PANEL
ANION GAP: 10 (ref 5–15)
BUN: 19 mg/dL (ref 6–20)
CALCIUM: 8.5 mg/dL — AB (ref 8.9–10.3)
CO2: 33 mmol/L — AB (ref 22–32)
CREATININE: 0.78 mg/dL (ref 0.44–1.00)
Chloride: 93 mmol/L — ABNORMAL LOW (ref 101–111)
GFR calc Af Amer: >60 mL/min (ref >60)
GLUCOSE: 173 mg/dL — AB (ref 65–99)
Potassium: 5.5 mmol/L — ABNORMAL HIGH (ref 3.5–5.1)
Sodium: 136 mmol/L (ref 135–145)

## 2016-01-26 LAB — PROTIME-INR
INR: 2.16 — AB (ref 0.00–1.49)
PROTHROMBIN TIME: 23.9 s — AB (ref 11.6–15.2)

## 2016-01-26 LAB — GLUCOSE, CAPILLARY
GLUCOSE-CAPILLARY: 106 mg/dL — AB (ref 65–99)
GLUCOSE-CAPILLARY: 267 mg/dL — AB (ref 65–99)
GLUCOSE-CAPILLARY: 229 mg/dL — AB (ref 65–99)
Glucose-Capillary: 363 mg/dL — ABNORMAL HIGH (ref 65–99)

## 2016-01-26 MED ORDER — IPRATROPIUM-ALBUTEROL 0.5-2.5 (3) MG/3ML IN SOLN
3.0000 mL | Freq: Three times a day (TID) | RESPIRATORY_TRACT | Status: DC
  Administered 2016-01-27: 3 mL via RESPIRATORY_TRACT
  Filled 2016-01-26: qty 3

## 2016-01-26 MED ORDER — WARFARIN SODIUM 5 MG PO TABS
2.5000 mg | ORAL_TABLET | Freq: Once | ORAL | Status: AC
  Administered 2016-01-26: 2.5 mg via ORAL
  Filled 2016-01-26: qty 1

## 2016-01-26 MED ORDER — SODIUM POLYSTYRENE SULFONATE 15 GM/60ML PO SUSP
15.0000 g | Freq: Once | ORAL | Status: AC
  Administered 2016-01-26: 15 g via ORAL
  Filled 2016-01-26: qty 60

## 2016-01-26 MED ORDER — WARFARIN SODIUM 5 MG PO TABS: 5.0000 mg | ORAL_TABLET | ORAL | Status: DC

## 2016-01-26 NOTE — Progress Notes (Signed)
Inpatient Diabetes Program Recommendations  AACE/ADA: New Consensus Statement on Inpatient Glycemic Control (2015)  Target Ranges:  Prepandial:   less than 140 mg/dL      Peak postprandial:   less than 180 mg/dL (1-2 hours)      Critically ill patients:  140 - 180 mg/dL   Review of Glycemic Control  Results for Theresa [name], Theresa [name] (MRN [id number]) as of 01/26/2016 10:45  Ref. Range 01/25/2016 07:38 01/25/2016 12:28 01/25/2016 17:18 01/25/2016 21:10 01/26/2016 08:25  Glucose-Capillary Latest Ref Range: 65-99 mg/dL 108 (H) 289 (H) 274 (H) 266 (H) 106 (H)  Results for [name], [name] (MRN [id number]) as of 01/26/2016 10:45  Ref. Range 01/23/2016 08:40  Hemoglobin A1C Latest Ref Range: 4.8-5.6 % 13.2 (H)    Inpatient Diabetes Program Recommendations:    Will need to order Insulin Starter Kit and RN to begin teaching insulin administration. Agree with Lantus 30 units QHS. Post-prandial blood sugars elevated and will likely need meal coverage insulin - 4 units tidwc.  To speak with pt later today regarding her glycemic control.  Thank you. Lorenda Peck, RD, LDN, CDE Inpatient Diabetes Coordinator [phone]

## 2016-01-26 NOTE — Discharge Instructions (Signed)

## 2016-01-26 NOTE — Care Management Important Message (Signed)
Important Message  Patient Details  Name: Theresa [name] MRN: [id number] Date of Birth: 1966-07-06   Medicare Important Message Given:  Yes    Nathen May 01/26/2016, 1:23 PM

## 2016-01-26 NOTE — Progress Notes (Signed)
ANTICOAGULATION CONSULT NOTE - Follow Up Consult  Pharmacy Consult for Coumadin Indication: Lupus anticoagulant  Allergies  Allergen Reactions  . Penicillins Rash    Has patient had a PCN reaction causing immediate rash, facial/tongue/throat swelling, SOB or lightheadedness with hypotension: Yes Has patient had a PCN reaction causing severe rash involving mucus membranes or skin necrosis: No Has patient had a PCN reaction that required hospitalization No Has patient had a PCN reaction occurring within the last 10 years: No If all of the above answers are "NO", then may proceed with Cephalosporin use.   REACTION: rash    Patient Measurements: Height: 4\' 9"  (144.8 cm) Weight: 217 lb 2.5 oz (98.5 kg) IBW/kg (Calculated) : 38.6 Heparin Dosing Weight:   Vital Signs: Temp: 97.7 F (36.5 C) (05/22 0850) Temp Source: Oral (05/22 0850) BP: 118/93 mmHg (05/22 0909) Pulse Rate: 62 (05/22 0909)  Labs:  Recent Labs  01/23/16 1119 01/23/16 1655 01/24/16 0318 01/25/16 0256 01/26/16 0336  HGB  --   --  16.9* 16.9*  --   HCT  --   --  53.4* 53.7*  --   PLT  --   --  208 224  --   LABPROT  --   --  31.8* 28.5* 23.9*  INR  --   --  3.16* 2.72* 2.16*  CREATININE  --   --  0.74 0.93 0.78  TROPONINI 0.04* 0.04*  --   --   --     Estimated Creatinine Clearance: 83.1 mL/min (by C-G formula based on Cr of 0.78).   Assessment: 50yo female on Coumadin for Lupus AC. CT (-)PE this admission. Dose held on 5/20 d/t elevated INR.  Two orders for warfarin 2.5mg  were entered yesterday by RPh and appears both were given despite RPh's plan of only giving 2.5mg  - SZP completed.  INR 2.16 this morning. Remains on Flagyl and Levaquin. Hgb 16.9, plts 224- no bleeding noted.  Goal of Therapy:  INR 2-3 Monitor platelets by anticoagulation protocol: Yes   Plan:  -Coumadin 2.5mg  x1 tonight, then resume home dose of 5mg  daily except 2.5mg  on Sundays starting 5/23. -Daily INR -Watch for s/s of  bleeding -Follow LOT for antibiotics  Sherryann Frese D. Woodley Petzold, PharmD, BCPS Clinical Pharmacist Pager: [phone] 01/26/2016 10:43 AM
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
## 2016-01-27 LAB — CULTURE, RESPIRATORY

## 2016-01-27 LAB — BASIC METABOLIC PANEL
Anion gap: 8 (ref 5–15)
BUN: 23 mg/dL — ABNORMAL HIGH (ref 6–20)
CALCIUM: 8.8 mg/dL — AB (ref 8.9–10.3)
CO2: 38 mmol/L — AB (ref 22–32)
CREATININE: 1.03 mg/dL — AB (ref 0.44–1.00)
Chloride: 91 mmol/L — ABNORMAL LOW (ref 101–111)
GLUCOSE: 166 mg/dL — AB (ref 65–99)
Potassium: 3.8 mmol/L (ref 3.5–5.1)
Sodium: 137 mmol/L (ref 135–145)

## 2016-01-27 LAB — CULTURE, RESPIRATORY W GRAM STAIN

## 2016-01-27 LAB — GLUCOSE, CAPILLARY: Glucose-Capillary: 126 mg/dL — ABNORMAL HIGH (ref 65–99)

## 2016-01-27 LAB — PROTIME-INR
INR: 2.66 — AB (ref 0.00–1.49)
PROTHROMBIN TIME: 28 s — AB (ref 11.6–15.2)

## 2016-01-27 MED ORDER — "INSULIN SYRINGE 31G X 5/16"" 0.3 ML MISC": 1.0000 | Freq: Every day | Status: DC

## 2016-01-27 MED ORDER — INSULIN GLARGINE 100 UNIT/ML ~~LOC~~ SOLN: 20.0000 [IU] | Freq: Every day | SUBCUTANEOUS | Status: DC

## 2016-01-27 MED ORDER — PREDNISONE 20 MG PO TABS: 20.0000 mg | ORAL_TABLET | Freq: Every day | ORAL | Status: DC

## 2016-01-27 MED ORDER — LEVOFLOXACIN 500 MG PO TABS: 500.0000 mg | ORAL_TABLET | Freq: Every day | ORAL | Status: DC

## 2016-01-27 NOTE — Progress Notes (Addendum)
Pt going to be discharge at home on oxygen. Pt is concern about her oxygen tank at home if it is functioning well or not. Case management consult for equipment. Pt education was given on diet control and smoking cessation. Pt administered Lantus to herself via teach back learning.  Family at bedside updated.

## 2016-01-27 NOTE — Progress Notes (Signed)
Pt refuse trach care at this time.
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
## 2016-01-27 NOTE — Care Management Note (Signed)
Case Management Note  Patient Details  Name: ALANI FINKE MRN: [id number] Date of Birth: 06/15/1966  Subjective/Objective:        Adm w resp failure, has prexisting trach            Action/Plan: lives w husband, has home oxygen and supplies w [facility], having trouble filling port tanks and have req w [facility] that resp there go out and check equipment. Husband has port o2 in care for transport home today.   Expected Discharge Date:                  Expected Discharge Plan:  [place]  In-House Referral:     Discharge planning Services  CM Consult  Post Acute Care Choice:  Home Health Choice offered to:  Patient  DME Arranged:    DME Agency:     HH Arranged:  RN [place] Agency:  [place]  Status of Service:  Completed, signed off  Medicare Important Message Given:  Yes Date Medicare IM Given:    Medicare IM give by:    Date Additional Medicare IM Given:    Additional Medicare Important Message give by:     If discussed at [place] of Stay Meetings, dates discussed:    Additional Comments: went over [facility] agency list and they would [facility] to use [facility]. Have made ref to [facility] w [facility] for hhrn. Pt for dc home today.  Lacretia Leigh, RN 01/27/2016, 9:08 AM

## 2016-01-28 LAB — CULTURE, BLOOD (ROUTINE X 2)
CULTURE: NO GROWTH
Culture: NO GROWTH

## 2016-02-03 DIAGNOSIS — I5030 Unspecified diastolic (congestive) heart failure: Secondary | ICD-10-CM | POA: Diagnosis not present

## 2016-02-03 DIAGNOSIS — E1142 Type 2 diabetes mellitus with diabetic polyneuropathy: Secondary | ICD-10-CM | POA: Diagnosis not present

## 2016-02-03 DIAGNOSIS — I10 Essential (primary) hypertension: Secondary | ICD-10-CM | POA: Diagnosis not present

## 2016-02-03 DIAGNOSIS — J449 Chronic obstructive pulmonary disease, unspecified: Secondary | ICD-10-CM | POA: Diagnosis not present

## 2016-02-10 ENCOUNTER — Ambulatory Visit: Payer: Medicare Other | Admitting: Cardiovascular Disease

## 2016-02-11 DIAGNOSIS — I2510 Atherosclerotic heart disease of native coronary artery without angina pectoris: Secondary | ICD-10-CM | POA: Diagnosis not present

## 2016-02-11 DIAGNOSIS — I10 Essential (primary) hypertension: Secondary | ICD-10-CM | POA: Diagnosis not present

## 2016-02-11 DIAGNOSIS — E119 Type 2 diabetes mellitus without complications: Secondary | ICD-10-CM | POA: Diagnosis not present

## 2016-02-11 DIAGNOSIS — M159 Polyosteoarthritis, unspecified: Secondary | ICD-10-CM | POA: Diagnosis not present

## 2016-02-12 ENCOUNTER — Ambulatory Visit: Payer: Medicare Other | Admitting: Internal Medicine

## 2016-02-17 ENCOUNTER — Ambulatory Visit: Payer: Medicare Other | Admitting: Cardiovascular Disease

## 2016-03-02 ENCOUNTER — Ambulatory Visit (INDEPENDENT_AMBULATORY_CARE_PROVIDER_SITE_OTHER): Payer: Medicare Other | Admitting: Cardiovascular Disease

## 2016-03-02 ENCOUNTER — Encounter: Payer: Self-pay | Admitting: Cardiovascular Disease

## 2016-03-02 VITALS — BP 130/86 | HR 73 | Ht <= 58 in | Wt 219.0 lb

## 2016-03-02 DIAGNOSIS — I25768 Atherosclerosis of bypass graft of coronary artery of transplanted heart with other forms of angina pectoris: Secondary | ICD-10-CM

## 2016-03-02 DIAGNOSIS — D6862 Lupus anticoagulant syndrome: Secondary | ICD-10-CM

## 2016-03-02 DIAGNOSIS — I82409 Acute embolism and thrombosis of unspecified deep veins of unspecified lower extremity: Secondary | ICD-10-CM

## 2016-03-02 DIAGNOSIS — I10 Essential (primary) hypertension: Secondary | ICD-10-CM

## 2016-03-02 DIAGNOSIS — Z716 Tobacco abuse counseling: Secondary | ICD-10-CM

## 2016-03-02 DIAGNOSIS — I5032 Chronic diastolic (congestive) heart failure: Secondary | ICD-10-CM

## 2016-03-02 NOTE — Patient Instructions (Signed)
Your physician wants you to follow-up in: [place]. [place] You will receive a reminder letter in the mail two months in advance. If you don't receive a letter, please call our office to schedule the follow-up appointment.  Your physician has recommended you make the following change in your medication:   STOP ASPIRIN   Thank you for choosing [facility]!!

## 2016-03-02 NOTE — Addendum Note (Signed)
Addended by: Julian Hy T on: 03/02/2016 11:30 AM   Modules accepted: Orders, Medications

## 2016-03-02 NOTE — Progress Notes (Signed)
Patient ID: Theresa [name], female   DOB: 1966-07-30, 50 y.o.   MRN: [id number]      SUBJECTIVE: The patient presents for routine follow-up. She was hospitalized in May 2017 for acute on chronic hypoxic respiratory failure multifactorial in etiology due to acute on chronic diastolic heart failure, pneumonia, and COPD exacerbation.  She was hospitalized for any acute inferior STEMI in February 2017 and underwent drug-eluting stent placement in the distal SVG to the PDA. She has a history of two-vessel CABG with stenting prior to her CABG performed in [place] with CABG performed in [place] ([place]) at [facility] in 2007. She also has a history of diabetes, hypertension, hyperlipidemia, Lupus anticoagulant with DVT and is on chronic anticoagulant therapy. She underwent tracheostomy in 2002 for acute hypoxic respiratory failure.   Ultimately it was decided that she would need aggressive antiplatelet therapy and anticoagulation due to acute STEMI and thrombophilia with lupus anticoagulant. It was felt she could be on aspirin 81 mg , Plavix, and Eliquis with aspirin to be discontinued after 3 months of therapy.  She has COPD and continues to smoke.   Echocardiogram 01/25/16 showed normal left ventricular systolic function and regional wall motion, LVEF 50-55%, moderate LVH, mild mitral regurgitation, and indeterminate diastolic function.  She denies chest pain and shortness of breath. She takes Lasix 40 mg daily.   Review of Systems: As per "subjective", otherwise negative.  Allergies  Allergen Reactions  . Penicillins Rash    Has patient had a PCN reaction causing immediate rash, facial/tongue/throat swelling, SOB or lightheadedness with hypotension: Yes Has patient had a PCN reaction causing severe rash involving mucus membranes or skin necrosis: No Has patient had a PCN reaction that required hospitalization No Has patient had a PCN reaction occurring within the last 10 years:  No If all of the above answers are "NO", then may proceed with Cephalosporin use.   REACTION: rash    Current Outpatient Prescriptions  Medication Sig Dispense Refill  . acetaminophen (TYLENOL) 325 MG tablet Take 2 tablets (650 mg total) by mouth every 4 (four) hours as needed for headache or mild pain.    Marland Kitchen albuterol (PROVENTIL) (2.5 MG/3ML) 0.083% nebulizer solution Take 3 mLs (2.5 mg total) by nebulization every 4 (four) hours as needed for wheezing or shortness of breath. 75 mL 12  . aspirin 81 MG chewable tablet Chew 1 tablet (81 mg total) by mouth daily.    Marland Kitchen atorvastatin (LIPITOR) 80 MG tablet Take 1 tablet (80 mg total) by mouth daily at 6 PM. 30 tablet 6  . clopidogrel (PLAVIX) 75 MG tablet Take 1 tablet (75 mg total) by mouth daily. 30 tablet 11  . diazepam (VALIUM) 5 MG tablet Take 5 mg by mouth at bedtime. Take every night per patient    . furosemide (LASIX) 40 MG tablet Take 1 tablet (40 mg total) by mouth daily. (Patient taking differently: Take 40 mg by mouth 2 (two) times daily as needed for fluid. ) 30 tablet 6  . gabapentin (NEURONTIN) 300 MG capsule Take 300-600 mg by mouth 2 (two) times daily.    . insulin glargine (LANTUS) 100 UNIT/ML injection Inject 0.2 mLs (20 Units total) into the skin at bedtime. 10 mL 11  . Insulin Syringe-Needle U-100 (INSULIN SYRINGE .3CC/31GX5/16") 31G X 5/16" 0.3 ML MISC 1 Syringe by Does not apply route daily. 100 each 1  . lisinopril (PRINIVIL,ZESTRIL) 20 MG tablet Take 1 tablet (20 mg total) by mouth daily. [place]  tablet 3  . metFORMIN (GLUCOPHAGE) 500 MG tablet Take 500 mg by mouth 2 (two) times daily with a meal.    . metoprolol (LOPRESSOR) 50 MG tablet Take 50 mg by mouth 2 (two) times daily.    . nitroGLYCERIN (NITROSTAT) 0.4 MG SL tablet Place 0.4 mg under the tongue every 5 (five) minutes as needed for chest pain.    . simethicone (GAS-X) 80 MG chewable tablet Chew 80 mg by mouth every 6 (six) hours as needed for flatulence.    .  Tracheostomy Care KIT 1 kit by Does not apply route daily. 30 each 6  . warfarin (COUMADIN) 5 MG tablet Take 2.5-5 mg by mouth See admin instructions. Pt takes 2.65m on Sundays - pt takes 590mall other days     No current facility-administered medications for this visit.    Past Medical History  Diagnosis Date  . Lupus anticoagulant disorder (HCC)     on coumadin  . Respiratory failure (H[facility]    s/p tracheostomy 2002  . CAD (coronary artery disease)     s/p 2V CABG in 2007  . Diabetes mellitus (HC[place]  . HTN (hypertension)   . Hyperlipidemia   . Cervical cancer (HCLawai  . CAD (coronary artery disease) of artery bypass graft 11/03/2015  . [facility] 11/03/2015  . ST elevation (STEMI) myocardial infarction involving right coronary artery (HCYarnell2/22/17    stent to VG to PDA  . CHF (congestive heart failure) (HC[place]  . COPD (chronic obstructive pulmonary disease) (HCWall[place]  . Endometriosis     Past Surgical History  Procedure Laterality Date  . Coronary artery bypass graft  2007    2V  . Carotid stent    . Tracheostomy    . Cesarean section with bilateral tubal ligation    . Cardiac catheterization N/A 10/29/2015    Procedure: Left Heart Cath and Cors/Grafts Angiography;  Surgeon: ChBurnell BlanksMD;  [facility];  Service: Cardiovascular;  Laterality: N/A;  . Cardiac catheterization  10/29/2015    Procedure: Coronary Stent Intervention;  Surgeon: ChBurnell BlanksMD;  [facility];  Service: Cardiovascular;;    Social History   Social History  . Marital Status: Married    Spouse Name: N/A  . Number of Children: N/A  . Years of Education: N/A   Occupational History  . Not on file.   Social History Main Topics  . Smoking status: Current Every Day Smoker -- 0.75 packs/day    Types: Cigarettes    Start date: 10/23/1978  . Smokeless tobacco: Never Used  . Alcohol Use: No  . Drug Use: No  .  Sexual Activity: Not on file   Other Topics Concern  . Not on file   Social History Narrative     Filed Vitals:   03/02/16 1103  BP: 130/86  Pulse: 73  Height: _0  (1.448 m)  Weight: 219 lb (99.338 kg)  SpO2: 90%    PHYSICAL EXAM General: NAD HEENT: Normal. Neck: No JVD, tracheostomy. Lungs: Clear to auscultation bilaterally with normal respiratory effort. CV: Nondisplaced PMI. Regular rate and rhythm, normal S1/S2, no S3/S4, no murmur. Trivial dorsal pedal edema.  Abdomen: Obese.  Neurologic: Alert and oriented.  Psych: Normal affect. Skin: Normal. Musculoskeletal: No gross deformities.    ECG: Most recent ECG reviewed.      ASSESSMENT AND PLAN: 1. CAD with CABG and recent inferior  wall STEMI: Symptomatically stable. Continue Plavix along with warfarin (no longer on Eliquis). I will dc ASA. Continue metoprolol, lisinopril, and Lipitor 80 mg.   2. Essential HTN: Controlled. No changes.  3. Hyperlipidemia: Continue Lipitor 80 mg.  4. Lupus anticoagulant with DVT: Continue warfarin indefinitely. No longer on Eliquis.  5. Tobacco abuse: Cessation counseling previously given.  6. Chronic diastolic heart failure: Compensated. No changes.  Dispo: f/u 6 months.   Kate Sable, M.D., F.A.C.C.

## 2016-04-13 ENCOUNTER — Other Ambulatory Visit: Payer: Self-pay | Admitting: *Deleted

## 2016-04-13 MED ORDER — LISINOPRIL 20 MG PO TABS: 20.0000 mg | ORAL_TABLET | Freq: Every day | ORAL | 3 refills | Status: DC

## 2016-05-07 DIAGNOSIS — I5030 Unspecified diastolic (congestive) heart failure: Secondary | ICD-10-CM | POA: Diagnosis not present

## 2016-05-07 DIAGNOSIS — J449 Chronic obstructive pulmonary disease, unspecified: Secondary | ICD-10-CM | POA: Diagnosis not present

## 2016-05-07 DIAGNOSIS — I80209 Phlebitis and thrombophlebitis of unspecified deep vessels of unspecified lower extremity: Secondary | ICD-10-CM | POA: Diagnosis not present

## 2016-05-07 DIAGNOSIS — I82409 Acute embolism and thrombosis of unspecified deep veins of unspecified lower extremity: Secondary | ICD-10-CM | POA: Diagnosis not present

## 2016-05-13 DIAGNOSIS — I252 Old myocardial infarction: Secondary | ICD-10-CM | POA: Diagnosis not present

## 2016-05-13 DIAGNOSIS — Z79899 Other long term (current) drug therapy: Secondary | ICD-10-CM | POA: Diagnosis not present

## 2016-05-13 DIAGNOSIS — R918 Other nonspecific abnormal finding of lung field: Secondary | ICD-10-CM | POA: Diagnosis not present

## 2016-05-13 DIAGNOSIS — R0602 Shortness of breath: Secondary | ICD-10-CM | POA: Diagnosis not present

## 2016-05-13 DIAGNOSIS — J3800 Paralysis of vocal cords and larynx, unspecified: Secondary | ICD-10-CM | POA: Diagnosis present

## 2016-05-13 DIAGNOSIS — I5033 Acute on chronic diastolic (congestive) heart failure: Secondary | ICD-10-CM | POA: Diagnosis present

## 2016-05-13 DIAGNOSIS — Z8249 Family history of ischemic heart disease and other diseases of the circulatory system: Secondary | ICD-10-CM | POA: Diagnosis not present

## 2016-05-13 DIAGNOSIS — Z880 Allergy status to penicillin: Secondary | ICD-10-CM | POA: Diagnosis not present

## 2016-05-13 DIAGNOSIS — Z930 Tracheostomy status: Secondary | ICD-10-CM | POA: Diagnosis not present

## 2016-05-13 DIAGNOSIS — Z7984 Long term (current) use of oral hypoglycemic drugs: Secondary | ICD-10-CM | POA: Diagnosis not present

## 2016-05-13 DIAGNOSIS — Z833 Family history of diabetes mellitus: Secondary | ICD-10-CM | POA: Diagnosis not present

## 2016-05-13 DIAGNOSIS — I509 Heart failure, unspecified: Secondary | ICD-10-CM | POA: Diagnosis not present

## 2016-05-13 DIAGNOSIS — Z7901 Long term (current) use of anticoagulants: Secondary | ICD-10-CM | POA: Diagnosis not present

## 2016-05-13 DIAGNOSIS — Z6841 Body Mass Index (BMI) 40.0 and over, adult: Secondary | ICD-10-CM | POA: Diagnosis not present

## 2016-05-13 DIAGNOSIS — J9611 Chronic respiratory failure with hypoxia: Secondary | ICD-10-CM | POA: Diagnosis present

## 2016-05-13 DIAGNOSIS — I2510 Atherosclerotic heart disease of native coronary artery without angina pectoris: Secondary | ICD-10-CM | POA: Diagnosis present

## 2016-05-13 DIAGNOSIS — Z809 Family history of malignant neoplasm, unspecified: Secondary | ICD-10-CM | POA: Diagnosis not present

## 2016-05-13 DIAGNOSIS — I10 Essential (primary) hypertension: Secondary | ICD-10-CM | POA: Diagnosis not present

## 2016-05-13 DIAGNOSIS — E876 Hypokalemia: Secondary | ICD-10-CM | POA: Diagnosis not present

## 2016-05-13 DIAGNOSIS — Z951 Presence of aortocoronary bypass graft: Secondary | ICD-10-CM | POA: Diagnosis not present

## 2016-05-13 DIAGNOSIS — F17200 Nicotine dependence, unspecified, uncomplicated: Secondary | ICD-10-CM | POA: Diagnosis present

## 2016-05-13 DIAGNOSIS — E1140 Type 2 diabetes mellitus with diabetic neuropathy, unspecified: Secondary | ICD-10-CM | POA: Diagnosis present

## 2016-05-13 DIAGNOSIS — I110 Hypertensive heart disease with heart failure: Secondary | ICD-10-CM | POA: Diagnosis present

## 2016-05-13 DIAGNOSIS — J441 Chronic obstructive pulmonary disease with (acute) exacerbation: Secondary | ICD-10-CM | POA: Diagnosis present

## 2016-05-13 DIAGNOSIS — R0902 Hypoxemia: Secondary | ICD-10-CM | POA: Diagnosis not present

## 2016-05-13 DIAGNOSIS — Z7902 Long term (current) use of antithrombotics/antiplatelets: Secondary | ICD-10-CM | POA: Diagnosis not present

## 2016-05-13 DIAGNOSIS — Z86718 Personal history of other venous thrombosis and embolism: Secondary | ICD-10-CM | POA: Diagnosis not present

## 2016-05-13 DIAGNOSIS — Z8673 Personal history of transient ischemic attack (TIA), and cerebral infarction without residual deficits: Secondary | ICD-10-CM | POA: Diagnosis not present

## 2016-05-24 DIAGNOSIS — F329 Major depressive disorder, single episode, unspecified: Secondary | ICD-10-CM | POA: Diagnosis not present

## 2016-05-24 DIAGNOSIS — J449 Chronic obstructive pulmonary disease, unspecified: Secondary | ICD-10-CM | POA: Diagnosis not present

## 2016-05-24 DIAGNOSIS — I10 Essential (primary) hypertension: Secondary | ICD-10-CM | POA: Diagnosis not present

## 2016-05-24 DIAGNOSIS — I82409 Acute embolism and thrombosis of unspecified deep veins of unspecified lower extremity: Secondary | ICD-10-CM | POA: Diagnosis not present

## 2016-05-24 DIAGNOSIS — Z6841 Body Mass Index (BMI) 40.0 and over, adult: Secondary | ICD-10-CM | POA: Diagnosis not present

## 2016-05-24 DIAGNOSIS — E1142 Type 2 diabetes mellitus with diabetic polyneuropathy: Secondary | ICD-10-CM | POA: Diagnosis not present

## 2016-05-24 DIAGNOSIS — Z299 Encounter for prophylactic measures, unspecified: Secondary | ICD-10-CM | POA: Diagnosis not present

## 2016-06-07 DIAGNOSIS — F17200 Nicotine dependence, unspecified, uncomplicated: Secondary | ICD-10-CM | POA: Diagnosis not present

## 2016-06-07 DIAGNOSIS — J449 Chronic obstructive pulmonary disease, unspecified: Secondary | ICD-10-CM | POA: Diagnosis not present

## 2016-06-07 DIAGNOSIS — E1142 Type 2 diabetes mellitus with diabetic polyneuropathy: Secondary | ICD-10-CM | POA: Diagnosis not present

## 2016-06-07 DIAGNOSIS — Z6841 Body Mass Index (BMI) 40.0 and over, adult: Secondary | ICD-10-CM | POA: Diagnosis not present

## 2016-06-07 DIAGNOSIS — Z299 Encounter for prophylactic measures, unspecified: Secondary | ICD-10-CM | POA: Diagnosis not present

## 2016-06-07 DIAGNOSIS — I5030 Unspecified diastolic (congestive) heart failure: Secondary | ICD-10-CM | POA: Diagnosis not present

## 2016-06-07 DIAGNOSIS — I82409 Acute embolism and thrombosis of unspecified deep veins of unspecified lower extremity: Secondary | ICD-10-CM | POA: Diagnosis not present

## 2016-07-28 DIAGNOSIS — I82409 Acute embolism and thrombosis of unspecified deep veins of unspecified lower extremity: Secondary | ICD-10-CM | POA: Diagnosis not present

## 2016-07-28 DIAGNOSIS — Z713 Dietary counseling and surveillance: Secondary | ICD-10-CM | POA: Diagnosis not present

## 2016-07-28 DIAGNOSIS — F17200 Nicotine dependence, unspecified, uncomplicated: Secondary | ICD-10-CM | POA: Diagnosis not present

## 2016-07-28 DIAGNOSIS — Z720 Tobacco use: Secondary | ICD-10-CM | POA: Diagnosis not present

## 2016-07-28 DIAGNOSIS — Z299 Encounter for prophylactic measures, unspecified: Secondary | ICD-10-CM | POA: Diagnosis not present

## 2016-07-28 DIAGNOSIS — J449 Chronic obstructive pulmonary disease, unspecified: Secondary | ICD-10-CM | POA: Diagnosis not present

## 2016-07-28 DIAGNOSIS — Z6841 Body Mass Index (BMI) 40.0 and over, adult: Secondary | ICD-10-CM | POA: Diagnosis not present

## 2016-08-26 DIAGNOSIS — J449 Chronic obstructive pulmonary disease, unspecified: Secondary | ICD-10-CM | POA: Diagnosis not present

## 2016-08-26 DIAGNOSIS — Z299 Encounter for prophylactic measures, unspecified: Secondary | ICD-10-CM | POA: Diagnosis not present

## 2016-08-26 DIAGNOSIS — Z713 Dietary counseling and surveillance: Secondary | ICD-10-CM | POA: Diagnosis not present

## 2016-08-26 DIAGNOSIS — Z6841 Body Mass Index (BMI) 40.0 and over, adult: Secondary | ICD-10-CM | POA: Diagnosis not present

## 2016-08-26 DIAGNOSIS — I82409 Acute embolism and thrombosis of unspecified deep veins of unspecified lower extremity: Secondary | ICD-10-CM | POA: Diagnosis not present

## 2016-08-27 DIAGNOSIS — I10 Essential (primary) hypertension: Secondary | ICD-10-CM | POA: Diagnosis not present

## 2016-08-27 DIAGNOSIS — M159 Polyosteoarthritis, unspecified: Secondary | ICD-10-CM | POA: Diagnosis not present

## 2016-08-27 DIAGNOSIS — I2510 Atherosclerotic heart disease of native coronary artery without angina pectoris: Secondary | ICD-10-CM | POA: Diagnosis not present

## 2016-08-27 DIAGNOSIS — E119 Type 2 diabetes mellitus without complications: Secondary | ICD-10-CM | POA: Diagnosis not present

## 2016-09-08 DIAGNOSIS — E1142 Type 2 diabetes mellitus with diabetic polyneuropathy: Secondary | ICD-10-CM | POA: Diagnosis not present

## 2016-09-08 DIAGNOSIS — F17210 Nicotine dependence, cigarettes, uncomplicated: Secondary | ICD-10-CM | POA: Diagnosis not present

## 2016-09-08 DIAGNOSIS — Z299 Encounter for prophylactic measures, unspecified: Secondary | ICD-10-CM | POA: Diagnosis not present

## 2016-09-08 DIAGNOSIS — F329 Major depressive disorder, single episode, unspecified: Secondary | ICD-10-CM | POA: Diagnosis not present

## 2016-09-08 DIAGNOSIS — J449 Chronic obstructive pulmonary disease, unspecified: Secondary | ICD-10-CM | POA: Diagnosis not present

## 2016-09-08 DIAGNOSIS — Z6841 Body Mass Index (BMI) 40.0 and over, adult: Secondary | ICD-10-CM | POA: Diagnosis not present

## 2016-09-08 DIAGNOSIS — J209 Acute bronchitis, unspecified: Secondary | ICD-10-CM | POA: Diagnosis not present

## 2016-09-08 DIAGNOSIS — I80209 Phlebitis and thrombophlebitis of unspecified deep vessels of unspecified lower extremity: Secondary | ICD-10-CM | POA: Diagnosis not present

## 2016-09-27 DIAGNOSIS — S76911A Strain of unspecified muscles, fascia and tendons at thigh level, right thigh, initial encounter: Secondary | ICD-10-CM | POA: Diagnosis not present

## 2016-09-27 DIAGNOSIS — Z86718 Personal history of other venous thrombosis and embolism: Secondary | ICD-10-CM | POA: Diagnosis not present

## 2016-09-27 DIAGNOSIS — Z880 Allergy status to penicillin: Secondary | ICD-10-CM | POA: Diagnosis not present

## 2016-09-27 DIAGNOSIS — I509 Heart failure, unspecified: Secondary | ICD-10-CM | POA: Diagnosis not present

## 2016-09-27 DIAGNOSIS — X58XXXA Exposure to other specified factors, initial encounter: Secondary | ICD-10-CM | POA: Diagnosis not present

## 2016-09-27 DIAGNOSIS — S86911A Strain of unspecified muscle(s) and tendon(s) at lower leg level, right leg, initial encounter: Secondary | ICD-10-CM | POA: Diagnosis not present

## 2016-09-27 DIAGNOSIS — I252 Old myocardial infarction: Secondary | ICD-10-CM | POA: Diagnosis not present

## 2016-09-27 DIAGNOSIS — F17200 Nicotine dependence, unspecified, uncomplicated: Secondary | ICD-10-CM | POA: Diagnosis not present

## 2016-09-27 DIAGNOSIS — Z7902 Long term (current) use of antithrombotics/antiplatelets: Secondary | ICD-10-CM | POA: Diagnosis not present

## 2016-09-27 DIAGNOSIS — I110 Hypertensive heart disease with heart failure: Secondary | ICD-10-CM | POA: Diagnosis not present

## 2016-09-27 DIAGNOSIS — Z9981 Dependence on supplemental oxygen: Secondary | ICD-10-CM | POA: Diagnosis not present

## 2016-09-27 DIAGNOSIS — Z794 Long term (current) use of insulin: Secondary | ICD-10-CM | POA: Diagnosis not present

## 2016-09-27 DIAGNOSIS — Z79899 Other long term (current) drug therapy: Secondary | ICD-10-CM | POA: Diagnosis not present

## 2016-09-27 DIAGNOSIS — I2510 Atherosclerotic heart disease of native coronary artery without angina pectoris: Secondary | ICD-10-CM | POA: Diagnosis not present

## 2016-09-27 DIAGNOSIS — M79604 Pain in right leg: Secondary | ICD-10-CM | POA: Diagnosis not present

## 2016-09-27 DIAGNOSIS — Z7901 Long term (current) use of anticoagulants: Secondary | ICD-10-CM | POA: Diagnosis not present

## 2016-09-27 DIAGNOSIS — E119 Type 2 diabetes mellitus without complications: Secondary | ICD-10-CM | POA: Diagnosis not present

## 2016-09-30 DIAGNOSIS — M5136 Other intervertebral disc degeneration, lumbar region: Secondary | ICD-10-CM | POA: Diagnosis not present

## 2016-09-30 DIAGNOSIS — I700 Atherosclerosis of aorta: Secondary | ICD-10-CM | POA: Diagnosis not present

## 2016-09-30 DIAGNOSIS — I82409 Acute embolism and thrombosis of unspecified deep veins of unspecified lower extremity: Secondary | ICD-10-CM | POA: Diagnosis not present

## 2016-09-30 DIAGNOSIS — M79604 Pain in right leg: Secondary | ICD-10-CM | POA: Diagnosis not present

## 2016-09-30 DIAGNOSIS — M25551 Pain in right hip: Secondary | ICD-10-CM | POA: Diagnosis not present

## 2016-10-08 DIAGNOSIS — Z6841 Body Mass Index (BMI) 40.0 and over, adult: Secondary | ICD-10-CM | POA: Diagnosis not present

## 2016-10-08 DIAGNOSIS — Z713 Dietary counseling and surveillance: Secondary | ICD-10-CM | POA: Diagnosis not present

## 2016-10-08 DIAGNOSIS — E1140 Type 2 diabetes mellitus with diabetic neuropathy, unspecified: Secondary | ICD-10-CM | POA: Diagnosis not present

## 2016-10-08 DIAGNOSIS — M549 Dorsalgia, unspecified: Secondary | ICD-10-CM | POA: Diagnosis not present

## 2016-10-08 DIAGNOSIS — I82409 Acute embolism and thrombosis of unspecified deep veins of unspecified lower extremity: Secondary | ICD-10-CM | POA: Diagnosis not present

## 2016-10-08 DIAGNOSIS — J449 Chronic obstructive pulmonary disease, unspecified: Secondary | ICD-10-CM | POA: Diagnosis not present

## 2016-10-08 DIAGNOSIS — Z299 Encounter for prophylactic measures, unspecified: Secondary | ICD-10-CM | POA: Diagnosis not present

## 2016-10-12 DIAGNOSIS — Z713 Dietary counseling and surveillance: Secondary | ICD-10-CM | POA: Diagnosis not present

## 2016-10-12 DIAGNOSIS — Z299 Encounter for prophylactic measures, unspecified: Secondary | ICD-10-CM | POA: Diagnosis not present

## 2016-10-12 DIAGNOSIS — R509 Fever, unspecified: Secondary | ICD-10-CM | POA: Diagnosis not present

## 2016-10-12 DIAGNOSIS — Z6841 Body Mass Index (BMI) 40.0 and over, adult: Secondary | ICD-10-CM | POA: Diagnosis not present

## 2016-10-12 DIAGNOSIS — F17210 Nicotine dependence, cigarettes, uncomplicated: Secondary | ICD-10-CM | POA: Diagnosis not present

## 2016-10-12 DIAGNOSIS — J111 Influenza due to unidentified influenza virus with other respiratory manifestations: Secondary | ICD-10-CM | POA: Diagnosis not present

## 2016-10-19 DIAGNOSIS — Z794 Long term (current) use of insulin: Secondary | ICD-10-CM | POA: Diagnosis not present

## 2016-10-19 DIAGNOSIS — Z6841 Body Mass Index (BMI) 40.0 and over, adult: Secondary | ICD-10-CM | POA: Diagnosis not present

## 2016-10-19 DIAGNOSIS — Z7902 Long term (current) use of antithrombotics/antiplatelets: Secondary | ICD-10-CM | POA: Diagnosis not present

## 2016-10-19 DIAGNOSIS — J441 Chronic obstructive pulmonary disease with (acute) exacerbation: Secondary | ICD-10-CM | POA: Diagnosis not present

## 2016-10-19 DIAGNOSIS — R0602 Shortness of breath: Secondary | ICD-10-CM | POA: Diagnosis not present

## 2016-10-19 DIAGNOSIS — I509 Heart failure, unspecified: Secondary | ICD-10-CM | POA: Diagnosis not present

## 2016-10-19 DIAGNOSIS — E1165 Type 2 diabetes mellitus with hyperglycemia: Secondary | ICD-10-CM | POA: Diagnosis not present

## 2016-10-19 DIAGNOSIS — M1990 Unspecified osteoarthritis, unspecified site: Secondary | ICD-10-CM | POA: Diagnosis not present

## 2016-10-19 DIAGNOSIS — I10 Essential (primary) hypertension: Secondary | ICD-10-CM | POA: Diagnosis present

## 2016-10-19 DIAGNOSIS — J189 Pneumonia, unspecified organism: Secondary | ICD-10-CM | POA: Diagnosis not present

## 2016-10-19 DIAGNOSIS — R0902 Hypoxemia: Secondary | ICD-10-CM | POA: Diagnosis not present

## 2016-10-19 DIAGNOSIS — Z7901 Long term (current) use of anticoagulants: Secondary | ICD-10-CM | POA: Diagnosis not present

## 2016-10-19 DIAGNOSIS — J101 Influenza due to other identified influenza virus with other respiratory manifestations: Secondary | ICD-10-CM | POA: Diagnosis present

## 2016-10-19 DIAGNOSIS — E119 Type 2 diabetes mellitus without complications: Secondary | ICD-10-CM | POA: Diagnosis not present

## 2016-10-19 DIAGNOSIS — F17209 Nicotine dependence, unspecified, with unspecified nicotine-induced disorders: Secondary | ICD-10-CM | POA: Diagnosis not present

## 2016-10-19 DIAGNOSIS — J1089 Influenza due to other identified influenza virus with other manifestations: Secondary | ICD-10-CM | POA: Diagnosis not present

## 2016-10-27 DIAGNOSIS — J189 Pneumonia, unspecified organism: Secondary | ICD-10-CM | POA: Diagnosis not present

## 2016-10-27 DIAGNOSIS — Z86718 Personal history of other venous thrombosis and embolism: Secondary | ICD-10-CM | POA: Diagnosis not present

## 2016-10-27 DIAGNOSIS — R0602 Shortness of breath: Secondary | ICD-10-CM | POA: Diagnosis not present

## 2016-10-27 DIAGNOSIS — R1011 Right upper quadrant pain: Secondary | ICD-10-CM | POA: Diagnosis not present

## 2016-10-27 DIAGNOSIS — F17209 Nicotine dependence, unspecified, with unspecified nicotine-induced disorders: Secondary | ICD-10-CM | POA: Diagnosis not present

## 2016-10-27 DIAGNOSIS — E7800 Pure hypercholesterolemia, unspecified: Secondary | ICD-10-CM | POA: Diagnosis present

## 2016-10-27 DIAGNOSIS — F17200 Nicotine dependence, unspecified, uncomplicated: Secondary | ICD-10-CM | POA: Diagnosis present

## 2016-10-27 DIAGNOSIS — I5022 Chronic systolic (congestive) heart failure: Secondary | ICD-10-CM | POA: Diagnosis present

## 2016-10-27 DIAGNOSIS — Z880 Allergy status to penicillin: Secondary | ICD-10-CM | POA: Diagnosis not present

## 2016-10-27 DIAGNOSIS — Z951 Presence of aortocoronary bypass graft: Secondary | ICD-10-CM | POA: Diagnosis not present

## 2016-10-27 DIAGNOSIS — Z6841 Body Mass Index (BMI) 40.0 and over, adult: Secondary | ICD-10-CM | POA: Diagnosis not present

## 2016-10-27 DIAGNOSIS — Z8249 Family history of ischemic heart disease and other diseases of the circulatory system: Secondary | ICD-10-CM | POA: Diagnosis not present

## 2016-10-27 DIAGNOSIS — Z809 Family history of malignant neoplasm, unspecified: Secondary | ICD-10-CM | POA: Diagnosis not present

## 2016-10-27 DIAGNOSIS — E669 Obesity, unspecified: Secondary | ICD-10-CM | POA: Diagnosis present

## 2016-10-27 DIAGNOSIS — I252 Old myocardial infarction: Secondary | ICD-10-CM | POA: Diagnosis not present

## 2016-10-27 DIAGNOSIS — I110 Hypertensive heart disease with heart failure: Secondary | ICD-10-CM | POA: Diagnosis not present

## 2016-10-27 DIAGNOSIS — Z930 Tracheostomy status: Secondary | ICD-10-CM | POA: Diagnosis not present

## 2016-10-27 DIAGNOSIS — J111 Influenza due to unidentified influenza virus with other respiratory manifestations: Secondary | ICD-10-CM | POA: Diagnosis present

## 2016-10-27 DIAGNOSIS — I10 Essential (primary) hypertension: Secondary | ICD-10-CM | POA: Diagnosis not present

## 2016-10-27 DIAGNOSIS — J1100 Influenza due to unidentified influenza virus with unspecified type of pneumonia: Secondary | ICD-10-CM | POA: Diagnosis not present

## 2016-10-27 DIAGNOSIS — Z833 Family history of diabetes mellitus: Secondary | ICD-10-CM | POA: Diagnosis not present

## 2016-10-27 DIAGNOSIS — M1990 Unspecified osteoarthritis, unspecified site: Secondary | ICD-10-CM | POA: Diagnosis present

## 2016-10-27 DIAGNOSIS — I2510 Atherosclerotic heart disease of native coronary artery without angina pectoris: Secondary | ICD-10-CM | POA: Diagnosis present

## 2016-10-27 DIAGNOSIS — Z8701 Personal history of pneumonia (recurrent): Secondary | ICD-10-CM | POA: Diagnosis not present

## 2016-10-27 DIAGNOSIS — J449 Chronic obstructive pulmonary disease, unspecified: Secondary | ICD-10-CM | POA: Diagnosis not present

## 2016-10-27 DIAGNOSIS — I472 Ventricular tachycardia: Secondary | ICD-10-CM | POA: Diagnosis not present

## 2016-10-27 DIAGNOSIS — R079 Chest pain, unspecified: Secondary | ICD-10-CM | POA: Diagnosis not present

## 2016-10-27 DIAGNOSIS — Z794 Long term (current) use of insulin: Secondary | ICD-10-CM | POA: Diagnosis not present

## 2016-10-27 DIAGNOSIS — Z7901 Long term (current) use of anticoagulants: Secondary | ICD-10-CM | POA: Diagnosis not present

## 2016-10-27 DIAGNOSIS — R0789 Other chest pain: Secondary | ICD-10-CM | POA: Diagnosis present

## 2016-10-27 DIAGNOSIS — E119 Type 2 diabetes mellitus without complications: Secondary | ICD-10-CM | POA: Diagnosis present

## 2016-10-27 DIAGNOSIS — J989 Respiratory disorder, unspecified: Secondary | ICD-10-CM | POA: Diagnosis not present

## 2016-11-01 DIAGNOSIS — I82409 Acute embolism and thrombosis of unspecified deep veins of unspecified lower extremity: Secondary | ICD-10-CM | POA: Diagnosis not present

## 2016-11-01 DIAGNOSIS — F17210 Nicotine dependence, cigarettes, uncomplicated: Secondary | ICD-10-CM | POA: Diagnosis not present

## 2016-11-01 DIAGNOSIS — J449 Chronic obstructive pulmonary disease, unspecified: Secondary | ICD-10-CM | POA: Diagnosis not present

## 2016-11-01 DIAGNOSIS — Z930 Tracheostomy status: Secondary | ICD-10-CM | POA: Diagnosis not present

## 2016-11-01 DIAGNOSIS — J3800 Paralysis of vocal cords and larynx, unspecified: Secondary | ICD-10-CM | POA: Diagnosis not present

## 2016-11-01 DIAGNOSIS — I509 Heart failure, unspecified: Secondary | ICD-10-CM | POA: Diagnosis not present

## 2016-11-01 DIAGNOSIS — J441 Chronic obstructive pulmonary disease with (acute) exacerbation: Secondary | ICD-10-CM | POA: Diagnosis not present

## 2016-11-01 DIAGNOSIS — Z951 Presence of aortocoronary bypass graft: Secondary | ICD-10-CM | POA: Diagnosis not present

## 2016-11-01 DIAGNOSIS — E119 Type 2 diabetes mellitus without complications: Secondary | ICD-10-CM | POA: Diagnosis not present

## 2016-11-01 DIAGNOSIS — Z9981 Dependence on supplemental oxygen: Secondary | ICD-10-CM | POA: Diagnosis not present

## 2016-11-01 DIAGNOSIS — Z7982 Long term (current) use of aspirin: Secondary | ICD-10-CM | POA: Diagnosis not present

## 2016-11-01 DIAGNOSIS — Z713 Dietary counseling and surveillance: Secondary | ICD-10-CM | POA: Diagnosis not present

## 2016-11-01 DIAGNOSIS — Z299 Encounter for prophylactic measures, unspecified: Secondary | ICD-10-CM | POA: Diagnosis not present

## 2016-11-01 DIAGNOSIS — Z6841 Body Mass Index (BMI) 40.0 and over, adult: Secondary | ICD-10-CM | POA: Diagnosis not present

## 2016-11-01 DIAGNOSIS — Z955 Presence of coronary angioplasty implant and graft: Secondary | ICD-10-CM | POA: Diagnosis not present

## 2016-11-01 DIAGNOSIS — J189 Pneumonia, unspecified organism: Secondary | ICD-10-CM | POA: Diagnosis not present

## 2016-11-01 DIAGNOSIS — Z7901 Long term (current) use of anticoagulants: Secondary | ICD-10-CM | POA: Diagnosis not present

## 2016-11-01 DIAGNOSIS — F329 Major depressive disorder, single episode, unspecified: Secondary | ICD-10-CM | POA: Diagnosis not present

## 2016-11-01 DIAGNOSIS — I110 Hypertensive heart disease with heart failure: Secondary | ICD-10-CM | POA: Diagnosis not present

## 2016-11-01 DIAGNOSIS — I2510 Atherosclerotic heart disease of native coronary artery without angina pectoris: Secondary | ICD-10-CM | POA: Diagnosis not present

## 2016-11-01 DIAGNOSIS — R1011 Right upper quadrant pain: Secondary | ICD-10-CM | POA: Diagnosis not present

## 2016-11-01 DIAGNOSIS — Z7952 Long term (current) use of systemic steroids: Secondary | ICD-10-CM | POA: Diagnosis not present

## 2016-11-01 DIAGNOSIS — J440 Chronic obstructive pulmonary disease with acute lower respiratory infection: Secondary | ICD-10-CM | POA: Diagnosis not present

## 2016-11-01 DIAGNOSIS — Z794 Long term (current) use of insulin: Secondary | ICD-10-CM | POA: Diagnosis not present

## 2016-11-01 DIAGNOSIS — Z7902 Long term (current) use of antithrombotics/antiplatelets: Secondary | ICD-10-CM | POA: Diagnosis not present

## 2016-11-01 DIAGNOSIS — I5030 Unspecified diastolic (congestive) heart failure: Secondary | ICD-10-CM | POA: Diagnosis not present

## 2016-11-04 DIAGNOSIS — Z9289 Personal history of other medical treatment: Secondary | ICD-10-CM

## 2016-11-04 DIAGNOSIS — J189 Pneumonia, unspecified organism: Secondary | ICD-10-CM | POA: Diagnosis not present

## 2016-11-04 DIAGNOSIS — J441 Chronic obstructive pulmonary disease with (acute) exacerbation: Secondary | ICD-10-CM | POA: Diagnosis not present

## 2016-11-04 DIAGNOSIS — J440 Chronic obstructive pulmonary disease with acute lower respiratory infection: Secondary | ICD-10-CM | POA: Diagnosis not present

## 2016-11-04 DIAGNOSIS — I2510 Atherosclerotic heart disease of native coronary artery without angina pectoris: Secondary | ICD-10-CM | POA: Diagnosis not present

## 2016-11-04 DIAGNOSIS — I110 Hypertensive heart disease with heart failure: Secondary | ICD-10-CM | POA: Diagnosis not present

## 2016-11-04 DIAGNOSIS — E119 Type 2 diabetes mellitus without complications: Secondary | ICD-10-CM | POA: Diagnosis not present

## 2016-11-04 HISTORY — DX: Personal history of other medical treatment: Z92.89

## 2016-11-05 DIAGNOSIS — J440 Chronic obstructive pulmonary disease with acute lower respiratory infection: Secondary | ICD-10-CM | POA: Diagnosis not present

## 2016-11-05 DIAGNOSIS — I110 Hypertensive heart disease with heart failure: Secondary | ICD-10-CM | POA: Diagnosis not present

## 2016-11-05 DIAGNOSIS — J441 Chronic obstructive pulmonary disease with (acute) exacerbation: Secondary | ICD-10-CM | POA: Diagnosis not present

## 2016-11-05 DIAGNOSIS — I2510 Atherosclerotic heart disease of native coronary artery without angina pectoris: Secondary | ICD-10-CM | POA: Diagnosis not present

## 2016-11-05 DIAGNOSIS — J189 Pneumonia, unspecified organism: Secondary | ICD-10-CM | POA: Diagnosis not present

## 2016-11-05 DIAGNOSIS — E119 Type 2 diabetes mellitus without complications: Secondary | ICD-10-CM | POA: Diagnosis not present

## 2016-11-08 DIAGNOSIS — R1011 Right upper quadrant pain: Secondary | ICD-10-CM | POA: Diagnosis not present

## 2016-11-08 DIAGNOSIS — K8020 Calculus of gallbladder without cholecystitis without obstruction: Secondary | ICD-10-CM | POA: Diagnosis not present

## 2016-11-09 DIAGNOSIS — I110 Hypertensive heart disease with heart failure: Secondary | ICD-10-CM | POA: Diagnosis not present

## 2016-11-09 DIAGNOSIS — J189 Pneumonia, unspecified organism: Secondary | ICD-10-CM | POA: Diagnosis not present

## 2016-11-09 DIAGNOSIS — I2510 Atherosclerotic heart disease of native coronary artery without angina pectoris: Secondary | ICD-10-CM | POA: Diagnosis not present

## 2016-11-09 DIAGNOSIS — J441 Chronic obstructive pulmonary disease with (acute) exacerbation: Secondary | ICD-10-CM | POA: Diagnosis not present

## 2016-11-09 DIAGNOSIS — J440 Chronic obstructive pulmonary disease with acute lower respiratory infection: Secondary | ICD-10-CM | POA: Diagnosis not present

## 2016-11-09 DIAGNOSIS — E119 Type 2 diabetes mellitus without complications: Secondary | ICD-10-CM | POA: Diagnosis not present

## 2016-11-10 DIAGNOSIS — J449 Chronic obstructive pulmonary disease, unspecified: Secondary | ICD-10-CM | POA: Diagnosis not present

## 2016-11-10 DIAGNOSIS — Z6841 Body Mass Index (BMI) 40.0 and over, adult: Secondary | ICD-10-CM | POA: Diagnosis not present

## 2016-11-10 DIAGNOSIS — I82409 Acute embolism and thrombosis of unspecified deep veins of unspecified lower extremity: Secondary | ICD-10-CM | POA: Diagnosis not present

## 2016-11-10 DIAGNOSIS — K8020 Calculus of gallbladder without cholecystitis without obstruction: Secondary | ICD-10-CM | POA: Diagnosis not present

## 2016-11-10 DIAGNOSIS — Z299 Encounter for prophylactic measures, unspecified: Secondary | ICD-10-CM | POA: Diagnosis not present

## 2016-11-10 DIAGNOSIS — I10 Essential (primary) hypertension: Secondary | ICD-10-CM | POA: Diagnosis not present

## 2016-11-10 DIAGNOSIS — G4700 Insomnia, unspecified: Secondary | ICD-10-CM | POA: Diagnosis not present

## 2016-11-10 DIAGNOSIS — E1142 Type 2 diabetes mellitus with diabetic polyneuropathy: Secondary | ICD-10-CM | POA: Diagnosis not present

## 2016-11-10 DIAGNOSIS — E7800 Pure hypercholesterolemia, unspecified: Secondary | ICD-10-CM | POA: Diagnosis not present

## 2016-11-10 DIAGNOSIS — F419 Anxiety disorder, unspecified: Secondary | ICD-10-CM | POA: Diagnosis not present

## 2016-11-10 DIAGNOSIS — I2510 Atherosclerotic heart disease of native coronary artery without angina pectoris: Secondary | ICD-10-CM | POA: Diagnosis not present

## 2016-11-10 DIAGNOSIS — I5030 Unspecified diastolic (congestive) heart failure: Secondary | ICD-10-CM | POA: Diagnosis not present

## 2016-11-12 DIAGNOSIS — I110 Hypertensive heart disease with heart failure: Secondary | ICD-10-CM | POA: Diagnosis not present

## 2016-11-12 DIAGNOSIS — J189 Pneumonia, unspecified organism: Secondary | ICD-10-CM | POA: Diagnosis not present

## 2016-11-12 DIAGNOSIS — J441 Chronic obstructive pulmonary disease with (acute) exacerbation: Secondary | ICD-10-CM | POA: Diagnosis not present

## 2016-11-12 DIAGNOSIS — E119 Type 2 diabetes mellitus without complications: Secondary | ICD-10-CM | POA: Diagnosis not present

## 2016-11-12 DIAGNOSIS — I2510 Atherosclerotic heart disease of native coronary artery without angina pectoris: Secondary | ICD-10-CM | POA: Diagnosis not present

## 2016-11-12 DIAGNOSIS — J440 Chronic obstructive pulmonary disease with acute lower respiratory infection: Secondary | ICD-10-CM | POA: Diagnosis not present

## 2016-11-15 DIAGNOSIS — R1011 Right upper quadrant pain: Secondary | ICD-10-CM | POA: Diagnosis not present

## 2016-11-16 DIAGNOSIS — J189 Pneumonia, unspecified organism: Secondary | ICD-10-CM | POA: Diagnosis not present

## 2016-11-16 DIAGNOSIS — I110 Hypertensive heart disease with heart failure: Secondary | ICD-10-CM | POA: Diagnosis not present

## 2016-11-16 DIAGNOSIS — I2510 Atherosclerotic heart disease of native coronary artery without angina pectoris: Secondary | ICD-10-CM | POA: Diagnosis not present

## 2016-11-16 DIAGNOSIS — J441 Chronic obstructive pulmonary disease with (acute) exacerbation: Secondary | ICD-10-CM | POA: Diagnosis not present

## 2016-11-16 DIAGNOSIS — J440 Chronic obstructive pulmonary disease with acute lower respiratory infection: Secondary | ICD-10-CM | POA: Diagnosis not present

## 2016-11-16 DIAGNOSIS — E119 Type 2 diabetes mellitus without complications: Secondary | ICD-10-CM | POA: Diagnosis not present

## 2016-11-18 DIAGNOSIS — J189 Pneumonia, unspecified organism: Secondary | ICD-10-CM | POA: Diagnosis not present

## 2016-11-18 DIAGNOSIS — J441 Chronic obstructive pulmonary disease with (acute) exacerbation: Secondary | ICD-10-CM | POA: Diagnosis not present

## 2016-11-18 DIAGNOSIS — I110 Hypertensive heart disease with heart failure: Secondary | ICD-10-CM | POA: Diagnosis not present

## 2016-11-18 DIAGNOSIS — E119 Type 2 diabetes mellitus without complications: Secondary | ICD-10-CM | POA: Diagnosis not present

## 2016-11-18 DIAGNOSIS — I2510 Atherosclerotic heart disease of native coronary artery without angina pectoris: Secondary | ICD-10-CM | POA: Diagnosis not present

## 2016-11-18 DIAGNOSIS — J440 Chronic obstructive pulmonary disease with acute lower respiratory infection: Secondary | ICD-10-CM | POA: Diagnosis not present

## 2016-11-19 DIAGNOSIS — I80209 Phlebitis and thrombophlebitis of unspecified deep vessels of unspecified lower extremity: Secondary | ICD-10-CM | POA: Diagnosis not present

## 2016-11-19 DIAGNOSIS — I82409 Acute embolism and thrombosis of unspecified deep veins of unspecified lower extremity: Secondary | ICD-10-CM | POA: Diagnosis not present

## 2016-11-19 DIAGNOSIS — F329 Major depressive disorder, single episode, unspecified: Secondary | ICD-10-CM | POA: Diagnosis not present

## 2016-11-19 DIAGNOSIS — J440 Chronic obstructive pulmonary disease with acute lower respiratory infection: Secondary | ICD-10-CM | POA: Diagnosis not present

## 2016-11-19 DIAGNOSIS — Z6841 Body Mass Index (BMI) 40.0 and over, adult: Secondary | ICD-10-CM | POA: Diagnosis not present

## 2016-11-19 DIAGNOSIS — J189 Pneumonia, unspecified organism: Secondary | ICD-10-CM | POA: Diagnosis not present

## 2016-11-19 DIAGNOSIS — I5030 Unspecified diastolic (congestive) heart failure: Secondary | ICD-10-CM | POA: Diagnosis not present

## 2016-11-19 DIAGNOSIS — E119 Type 2 diabetes mellitus without complications: Secondary | ICD-10-CM | POA: Diagnosis not present

## 2016-11-19 DIAGNOSIS — I10 Essential (primary) hypertension: Secondary | ICD-10-CM | POA: Diagnosis not present

## 2016-11-19 DIAGNOSIS — I2510 Atherosclerotic heart disease of native coronary artery without angina pectoris: Secondary | ICD-10-CM | POA: Diagnosis not present

## 2016-11-19 DIAGNOSIS — Z299 Encounter for prophylactic measures, unspecified: Secondary | ICD-10-CM | POA: Diagnosis not present

## 2016-11-19 DIAGNOSIS — J441 Chronic obstructive pulmonary disease with (acute) exacerbation: Secondary | ICD-10-CM | POA: Diagnosis not present

## 2016-11-19 DIAGNOSIS — J449 Chronic obstructive pulmonary disease, unspecified: Secondary | ICD-10-CM | POA: Diagnosis not present

## 2016-11-19 DIAGNOSIS — K8020 Calculus of gallbladder without cholecystitis without obstruction: Secondary | ICD-10-CM | POA: Diagnosis not present

## 2016-11-19 DIAGNOSIS — Z6821 Body mass index (BMI) 21.0-21.9, adult: Secondary | ICD-10-CM | POA: Diagnosis not present

## 2016-11-19 DIAGNOSIS — E1142 Type 2 diabetes mellitus with diabetic polyneuropathy: Secondary | ICD-10-CM | POA: Diagnosis not present

## 2016-11-19 DIAGNOSIS — I110 Hypertensive heart disease with heart failure: Secondary | ICD-10-CM | POA: Diagnosis not present

## 2016-11-26 DIAGNOSIS — J189 Pneumonia, unspecified organism: Secondary | ICD-10-CM | POA: Diagnosis not present

## 2016-11-26 DIAGNOSIS — J440 Chronic obstructive pulmonary disease with acute lower respiratory infection: Secondary | ICD-10-CM | POA: Diagnosis not present

## 2016-11-26 DIAGNOSIS — E119 Type 2 diabetes mellitus without complications: Secondary | ICD-10-CM | POA: Diagnosis not present

## 2016-11-26 DIAGNOSIS — I2510 Atherosclerotic heart disease of native coronary artery without angina pectoris: Secondary | ICD-10-CM | POA: Diagnosis not present

## 2016-11-26 DIAGNOSIS — J441 Chronic obstructive pulmonary disease with (acute) exacerbation: Secondary | ICD-10-CM | POA: Diagnosis not present

## 2016-11-26 DIAGNOSIS — I110 Hypertensive heart disease with heart failure: Secondary | ICD-10-CM | POA: Diagnosis not present

## 2016-11-29 ENCOUNTER — Encounter: Payer: Self-pay | Admitting: Internal Medicine

## 2016-12-02 DIAGNOSIS — J441 Chronic obstructive pulmonary disease with (acute) exacerbation: Secondary | ICD-10-CM | POA: Diagnosis not present

## 2016-12-02 DIAGNOSIS — I110 Hypertensive heart disease with heart failure: Secondary | ICD-10-CM | POA: Diagnosis not present

## 2016-12-02 DIAGNOSIS — I2510 Atherosclerotic heart disease of native coronary artery without angina pectoris: Secondary | ICD-10-CM | POA: Diagnosis not present

## 2016-12-02 DIAGNOSIS — J189 Pneumonia, unspecified organism: Secondary | ICD-10-CM | POA: Diagnosis not present

## 2016-12-02 DIAGNOSIS — E119 Type 2 diabetes mellitus without complications: Secondary | ICD-10-CM | POA: Diagnosis not present

## 2016-12-02 DIAGNOSIS — J440 Chronic obstructive pulmonary disease with acute lower respiratory infection: Secondary | ICD-10-CM | POA: Diagnosis not present

## 2016-12-16 DIAGNOSIS — I110 Hypertensive heart disease with heart failure: Secondary | ICD-10-CM | POA: Diagnosis not present

## 2016-12-16 DIAGNOSIS — E119 Type 2 diabetes mellitus without complications: Secondary | ICD-10-CM | POA: Diagnosis not present

## 2016-12-16 DIAGNOSIS — J440 Chronic obstructive pulmonary disease with acute lower respiratory infection: Secondary | ICD-10-CM | POA: Diagnosis not present

## 2016-12-16 DIAGNOSIS — I2510 Atherosclerotic heart disease of native coronary artery without angina pectoris: Secondary | ICD-10-CM | POA: Diagnosis not present

## 2016-12-16 DIAGNOSIS — J441 Chronic obstructive pulmonary disease with (acute) exacerbation: Secondary | ICD-10-CM | POA: Diagnosis not present

## 2016-12-16 DIAGNOSIS — J189 Pneumonia, unspecified organism: Secondary | ICD-10-CM | POA: Diagnosis not present

## 2016-12-21 ENCOUNTER — Encounter: Payer: Self-pay | Admitting: Nurse Practitioner

## 2016-12-21 ENCOUNTER — Ambulatory Visit (INDEPENDENT_AMBULATORY_CARE_PROVIDER_SITE_OTHER): Payer: Medicare Other | Admitting: Nurse Practitioner

## 2016-12-21 DIAGNOSIS — R1011 Right upper quadrant pain: Secondary | ICD-10-CM

## 2016-12-21 NOTE — Progress Notes (Signed)
cc'ed to pcp °

## 2016-12-21 NOTE — Progress Notes (Addendum)
Primary Care Physician:  Monico Blitz, MD Primary Gastroenterologist:  Dr. Gala Romney  Chief Complaint  Patient presents with  . Abdominal Pain    HPI:   Theresa [name] is a 51 y.o. female who presents On referral from primary care for right upper quadrant pain and "diagnosis of gallstones." PCP notes reviewed. HIDA scan included with notes that was completed at [facility]. Impression is study within normal limits. Our nursing staff is requesting previously completed right upper quadrant ultrasound.  Today she states she started having RUQ pain about 1.5 - 2 months ago. Had some testing done at [facility]. Pain started feeling like "a heart attach" and went to the ER and cardiac issues were ruled out. Pain typically after eating. Saw her PCP who had an U/S who found gallstones as well as HIDA scan. Pain typically after eating. Was started on a probiotic, which helped a lot. Still has pain in the evening time "after eating supper." Denies dyspepsia symptoms. Has had gas, abdominal swelling. Has a bowel movement "at least every otther day" and "some days has a major blowout. Pain eazes and wanes in intensity, radiated through to hrt back. If she has pain and has a bowel movement the pain will resolve with a bowel movement. Pain is not severe at this ongoing, but will get worse after eating. Denies hematochezia, melena, N/V. Had a couple episode of N/V, none in some time.  Past Medical History:  Diagnosis Date  . CAD (coronary artery disease)    s/p 2V CABG in 2007  . CAD (coronary artery disease) of artery bypass graft 11/03/2015  . Cervical cancer ([place])   . CHF (congestive heart failure) ([place])   . COPD (chronic obstructive pulmonary disease) ([place])   . Diabetes mellitus ([place])   . Endometriosis   . HTN (hypertension)   . Hyperlipidemia   . Lupus anticoagulant disorder (HCC)    on coumadin  . Respiratory failure ([place])    s/p tracheostomy 2002  . ST elevation (STEMI) myocardial  infarction involving right coronary artery ([place]) 10/29/15   stent to VG to PDA  . Tracheostomy in place [facility]), chronic since 2002 11/03/2015    Past Surgical History:  Procedure Laterality Date  . CARDIAC CATHETERIZATION N/A 10/29/2015   Procedure: Left Heart Cath and Cors/Grafts Angiography;  Surgeon: Burnell Blanks, MD;  Location: [place] CV LAB;  Service: Cardiovascular;  Laterality: N/A;  . CARDIAC CATHETERIZATION  10/29/2015   Procedure: Coronary Stent Intervention;  Surgeon: Burnell Blanks, MD;  Location: [place] CV LAB;  Service: Cardiovascular;;  . CAROTID STENT    . CESAREAN SECTION WITH BILATERAL TUBAL LIGATION    . CORONARY ARTERY BYPASS GRAFT  2007   2V  . TRACHEOSTOMY      Current Outpatient Prescriptions  Medication Sig Dispense Refill  . acetaminophen (TYLENOL) 325 MG tablet Take 2 tablets (650 mg total) by mouth every 4 (four) hours as needed for headache or mild pain.    Marland Kitchen albuterol (PROVENTIL) (2.5 MG/3ML) 0.083% nebulizer solution Take 3 mLs (2.5 mg total) by nebulization every 4 (four) hours as needed for wheezing or shortness of breath. 75 mL 12  . atorvastatin (LIPITOR) 80 MG tablet Take 1 tablet (80 mg total) by mouth daily at 6 PM. 30 tablet 6  . clopidogrel (PLAVIX) 75 MG tablet Take 1 tablet (75 mg total) by mouth daily. 30 tablet 11  . furosemide (LASIX) 40 MG tablet Take 1 tablet (40 mg total)  by mouth daily. (Patient taking differently: Take 40 mg by mouth 2 (two) times daily as needed for fluid. ) 30 tablet 6  . gabapentin (NEURONTIN) 300 MG capsule Take 300-600 mg by mouth 2 (two) times daily.    . insulin glargine (LANTUS) 100 UNIT/ML injection Inject 0.2 mLs (20 Units total) into the skin at bedtime. 10 mL 11  . Insulin Syringe-Needle U-100 (INSULIN SYRINGE .3CC/31GX5/16") 31G X 5/16" 0.3 ML MISC 1 Syringe by Does not apply route daily. 100 each 1  . lisinopril (PRINIVIL,ZESTRIL) 20 MG tablet Take 1 tablet (20 mg total) by mouth daily.  90 tablet 3  . metFORMIN (GLUCOPHAGE) 500 MG tablet Take 500 mg by mouth 2 (two) times daily with a meal.    . metoprolol (LOPRESSOR) 50 MG tablet Take 50 mg by mouth 2 (two) times daily.    . nitroGLYCERIN (NITROSTAT) 0.4 MG SL tablet Place 0.4 mg under the tongue every 5 (five) minutes as needed for chest pain.    . pantoprazole (PROTONIX) 40 MG tablet Take 40 mg by mouth daily.    . Tracheostomy Care KIT 1 kit by Does not apply route daily. 30 each 6  . traZODone (DESYREL) 50 MG tablet Take 50 mg by mouth at bedtime.    Marland Kitchen warfarin (COUMADIN) 5 MG tablet Take 2.5-5 mg by mouth See admin instructions. Pt takes 2.92m on Sundays - pt takes 563mall other days    . simethicone (GAS-X) 80 MG chewable tablet Chew 80 mg by mouth every 6 (six) hours as needed for flatulence.     No current facility-administered medications for this visit.     Allergies as of 12/21/2016 - Review Complete 12/21/2016  Allergen Reaction Noted  . Penicillins Rash     Family History  Problem Relation Age of Onset  . Hypertension Mother     Social History   Social History  . Marital status: Married    Spouse name: N/A  . Number of children: N/A  . Years of education: N/A   Occupational History  . Not on file.   Social History Main Topics  . Smoking status: Current Every Day Smoker    Packs/day: 0.75    Types: Cigarettes    Start date: 10/23/1978  . Smokeless tobacco: Never Used  . Alcohol use No  . Drug use: No  . Sexual activity: Not on file   Other Topics Concern  . Not on file   Social History Narrative  . No narrative on file    Review of Systems: General: Negative for anorexia, weight loss, fever, chills, fatigue, weakness. Eyes: Negative for vision changes.  ENT: Negative for hoarseness, difficulty swallowing , nasal congestion. CV: Negative for chest pain, angina, palpitations, dyspnea on exertion, peripheral edema.  Respiratory: Negative for dyspnea at rest, dyspnea on exertion,  cough, sputum, wheezing.  GI: See history of present illness. Derm: Negative for rash or itching.  Endo: Negative for unusual weight change.  Heme: Negative for bruising or bleeding. Allergy: Negative for rash or hives.    Physical Exam: BP 125/80   Pulse 72   Temp 98 F (36.7 C) (Oral)   Ht 4' 9"  (1.448 m)   Wt 209 lb 12.8 oz (95.2 kg)   BMI 45.40 kg/m  General:   Alert and oriented. Pleasant and cooperative. Well-nourished and well-developed.  Head:  Normocephalic and atraumatic. Eyes:  Without icterus, sclera clear and conjunctiva pink.  Ears:  Normal auditory acuity. Cardiovascular:  S1, S2  present without murmurs appreciated. Normal pulses noted. Extremities without clubbing or edema. Respiratory:  Bilateral qheezes, intermittent cough. No rales, or rhonchi. No distress. Trach noted. Gastrointestinal:  +BS, soft, non-tender and non-distended. No HSM noted. No guarding or rebound. No masses appreciated.  Rectal:  Deferred  Musculoskalatal:  Symmetrical without gross deformities. Normal posture. Neurologic:  Alert and oriented x4;  grossly normal neurologically. Psych:  Alert and cooperative. Normal mood and affect. Heme/Lymph/Immune: No excessive bruising noted.    12/21/2016 10:24 AM   Disclaimer: This note was dictated with voice recognition software. Similar sounding words can inadvertently be transcribed and may not be corrected upon review.

## 2016-12-21 NOTE — Assessment & Plan Note (Signed)
The patient was referred for complaints of right upper quadrant pain. HIDA scan was normal at [facility]. Primary care told the patient that if her gallbladder needs to come out and needs to be removed either in [place] or [place] due to her multiple comorbidities. Given that she needs a higher level of care should she per chance need surgical removal we will refer her to a higher level of care to make to decision on whether to remove her gallbladder, being that they will be the ones to remove it. I will discuss with Dr. Gala Romney which setting would be most appropriate: [place] or [facility] [place]. We will call the patient and let her know.

## 2016-12-21 NOTE — Patient Instructions (Signed)
1. I will discuss her case with Dr. Gala Romney. We will call you with our decision. 2. We will refer you to which ever location would be best suited for your care. 3. Call us if any questions.

## 2016-12-23 DIAGNOSIS — I5030 Unspecified diastolic (congestive) heart failure: Secondary | ICD-10-CM | POA: Diagnosis not present

## 2016-12-23 DIAGNOSIS — J441 Chronic obstructive pulmonary disease with (acute) exacerbation: Secondary | ICD-10-CM | POA: Diagnosis not present

## 2016-12-23 DIAGNOSIS — I82409 Acute embolism and thrombosis of unspecified deep veins of unspecified lower extremity: Secondary | ICD-10-CM | POA: Diagnosis not present

## 2016-12-23 DIAGNOSIS — Z6841 Body Mass Index (BMI) 40.0 and over, adult: Secondary | ICD-10-CM | POA: Diagnosis not present

## 2016-12-23 DIAGNOSIS — I2510 Atherosclerotic heart disease of native coronary artery without angina pectoris: Secondary | ICD-10-CM | POA: Diagnosis not present

## 2016-12-23 DIAGNOSIS — Z299 Encounter for prophylactic measures, unspecified: Secondary | ICD-10-CM | POA: Diagnosis not present

## 2016-12-23 DIAGNOSIS — I10 Essential (primary) hypertension: Secondary | ICD-10-CM | POA: Diagnosis not present

## 2016-12-23 DIAGNOSIS — F329 Major depressive disorder, single episode, unspecified: Secondary | ICD-10-CM | POA: Diagnosis not present

## 2016-12-23 DIAGNOSIS — E1142 Type 2 diabetes mellitus with diabetic polyneuropathy: Secondary | ICD-10-CM | POA: Diagnosis not present

## 2016-12-23 DIAGNOSIS — E1165 Type 2 diabetes mellitus with hyperglycemia: Secondary | ICD-10-CM | POA: Diagnosis not present

## 2016-12-23 DIAGNOSIS — E7800 Pure hypercholesterolemia, unspecified: Secondary | ICD-10-CM | POA: Diagnosis not present

## 2016-12-23 DIAGNOSIS — J449 Chronic obstructive pulmonary disease, unspecified: Secondary | ICD-10-CM | POA: Diagnosis not present

## 2016-12-27 DIAGNOSIS — I10 Essential (primary) hypertension: Secondary | ICD-10-CM | POA: Diagnosis not present

## 2016-12-27 DIAGNOSIS — M159 Polyosteoarthritis, unspecified: Secondary | ICD-10-CM | POA: Diagnosis not present

## 2016-12-27 DIAGNOSIS — E119 Type 2 diabetes mellitus without complications: Secondary | ICD-10-CM | POA: Diagnosis not present

## 2016-12-27 DIAGNOSIS — I2510 Atherosclerotic heart disease of native coronary artery without angina pectoris: Secondary | ICD-10-CM | POA: Diagnosis not present

## 2016-12-30 DIAGNOSIS — E119 Type 2 diabetes mellitus without complications: Secondary | ICD-10-CM | POA: Diagnosis not present

## 2016-12-30 DIAGNOSIS — I2510 Atherosclerotic heart disease of native coronary artery without angina pectoris: Secondary | ICD-10-CM | POA: Diagnosis not present

## 2016-12-30 DIAGNOSIS — J440 Chronic obstructive pulmonary disease with acute lower respiratory infection: Secondary | ICD-10-CM | POA: Diagnosis not present

## 2016-12-30 DIAGNOSIS — J441 Chronic obstructive pulmonary disease with (acute) exacerbation: Secondary | ICD-10-CM | POA: Diagnosis not present

## 2016-12-30 DIAGNOSIS — I110 Hypertensive heart disease with heart failure: Secondary | ICD-10-CM | POA: Diagnosis not present

## 2016-12-30 DIAGNOSIS — J189 Pneumonia, unspecified organism: Secondary | ICD-10-CM | POA: Diagnosis not present

## 2017-01-02 ENCOUNTER — Other Ambulatory Visit: Payer: Self-pay | Admitting: Cardiology

## 2017-01-03 NOTE — Telephone Encounter (Signed)
REFILL 

## 2017-01-11 ENCOUNTER — Ambulatory Visit: Payer: Medicare Other | Admitting: Pulmonary Disease

## 2017-01-23 IMAGING — CR DG CHEST 1V PORT
1 series · 1 of 1 positions shown · non-contrast
Comparison: 10/31/2015

CLINICAL DATA: Pulmonary edema

EXAM:
PORTABLE CHEST 1 VIEW

[AP]
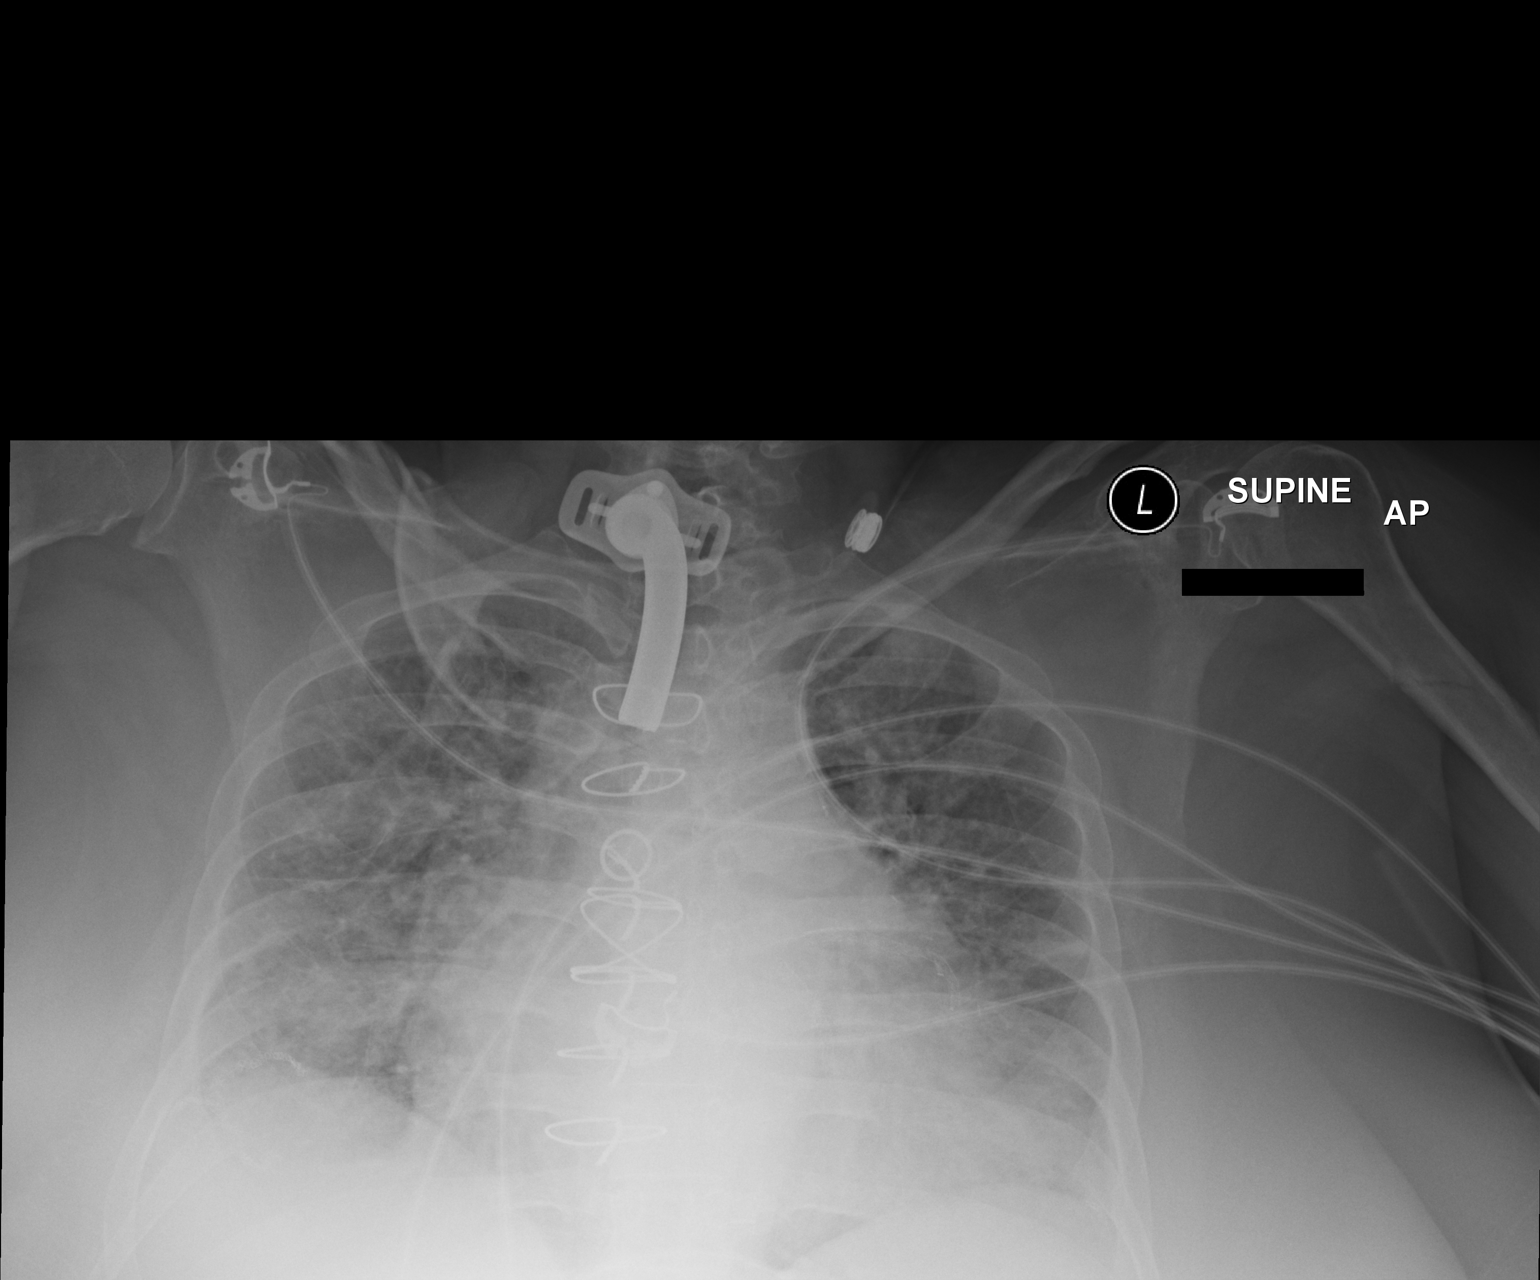

[1 of 1 positions shown; findings below may reference images not displayed]

FINDINGS: Cardiomegaly. Prior CABG. Tracheostomy tube is unchanged. There is
diffuse bilateral airspace opacities, likely edema. Low lung
volumes. Suspect small effusions. No acute bony abnormality.
IMPRESSION: Continued moderate CHF pattern. Suspect small layering effusions. No
real change.

## 2017-02-03 DIAGNOSIS — M159 Polyosteoarthritis, unspecified: Secondary | ICD-10-CM | POA: Diagnosis not present

## 2017-02-03 DIAGNOSIS — E119 Type 2 diabetes mellitus without complications: Secondary | ICD-10-CM | POA: Diagnosis not present

## 2017-02-03 DIAGNOSIS — I10 Essential (primary) hypertension: Secondary | ICD-10-CM | POA: Diagnosis not present

## 2017-02-03 DIAGNOSIS — I2510 Atherosclerotic heart disease of native coronary artery without angina pectoris: Secondary | ICD-10-CM | POA: Diagnosis not present

## 2017-02-21 NOTE — Progress Notes (Signed)
Cardiology Office Note   Date:  02/24/2017   ID:  VON INSCOE, DOB [birth year], MRN [id number]  PCP:  Monico Blitz, MD  Cardiologist:   [name], MD    Chief Complaint  Patient presents with  . Coronary Artery Disease      History of Present Illness: Theresa [name] is a 51 y.o. female who presents for follow up of diastolic HF.  She has a history of two-vessel CABG with stenting prior to her CABG performed in [place] with CABG performed in [place] ([place]) at [facility] in 2007.  She was hospitalized in May 2017 for acute on chronic hypoxic respiratory failure multifactorial in etiology due to acute on chronic diastolic heart failure, pneumonia, and COPD exacerbation.   She was hospitalized for an acute inferior STEMI in February 2017 and underwent drug-eluting stent placement in the distal SVG to the PDA.  She also has a history of diabetes, hypertension, hyperlipidemia, lupus anticoagulant with DVT and is on chronic anticoagulant therapy.     She has been doing relatively well.  She does get some increased lower extremity swelling and she's had to take a little more Lasix recently. However, she's not had any new shortness of breath, PND or orthopnea. Overall she's lost about 10 pounds through trying to change her diet. She tries to drink less sweet tea. She's not having any chest pressure, neck or arm discomfort. She's not describing palpitations, presyncope or syncope.  Past Medical History:  Diagnosis Date  . CAD (coronary artery disease)    s/p 2V CABG in 2007  . CAD (coronary artery disease) of artery bypass graft 11/03/2015  . Cervical cancer ([place])   . CHF (congestive heart failure) ([place])   . COPD (chronic obstructive pulmonary disease) ([place])   . Diabetes mellitus ([place])   . Endometriosis   . HTN (hypertension)   . Hyperlipidemia   . Lupus anticoagulant disorder (HCC)    on coumadin  . Respiratory failure ([place])    s/p tracheostomy 2002  . ST  elevation (STEMI) myocardial infarction involving right coronary artery ([place]) 10/29/15   stent to VG to PDA  . Tracheostomy in place [facility]), chronic since 2002 11/03/2015    Past Surgical History:  Procedure Laterality Date  . CARDIAC CATHETERIZATION N/A 10/29/2015   Procedure: Left Heart Cath and Cors/Grafts Angiography;  Surgeon: Burnell Blanks, MD;  Location: [place] CV LAB;  Service: Cardiovascular;  Laterality: N/A;  . CARDIAC CATHETERIZATION  10/29/2015   Procedure: Coronary Stent Intervention;  Surgeon: Burnell Blanks, MD;  Location: [place] CV LAB;  Service: Cardiovascular;;  . CAROTID STENT    . CESAREAN SECTION WITH BILATERAL TUBAL LIGATION    . CORONARY ARTERY BYPASS GRAFT  2007   2V  . TRACHEOSTOMY       Current Outpatient Prescriptions  Medication Sig Dispense Refill  . acetaminophen (TYLENOL) 325 MG tablet Take 2 tablets (650 mg total) by mouth every 4 (four) hours as needed for headache or mild pain.    Marland Kitchen albuterol (PROVENTIL) (2.5 MG/3ML) 0.083% nebulizer solution Take 3 mLs (2.5 mg total) by nebulization every 4 (four) hours as needed for wheezing or shortness of breath. 75 mL 12  . clobetasol ointment (TEMOVATE) 1.77 % Apply 1 application topically as needed.    . clopidogrel (PLAVIX) 75 MG tablet Take 1 tablet (75 mg total) by mouth daily. NEED OV. 90 tablet 0  . furosemide (LASIX) 20 MG tablet Take 40  mg by mouth every morning. & extra as needed    . gabapentin (NEURONTIN) 300 MG capsule Take 300-600 mg by mouth 2 (two) times daily.    . Insulin Glargine (TOUJEO SOLOSTAR) 300 UNIT/ML SOPN Inject 25 Units into the skin every evening.    Marland Kitchen lisinopril (PRINIVIL,ZESTRIL) 20 MG tablet Take 1 tablet (20 mg total) by mouth daily. 90 tablet 3  . metFORMIN (GLUCOPHAGE) 500 MG tablet Take 500 mg by mouth 2 (two) times daily with a meal.    . metoprolol (LOPRESSOR) 50 MG tablet Take 50 mg by mouth 2 (two) times daily.    . nitroGLYCERIN (NITROSTAT) 0.4 MG SL  tablet Place 0.4 mg under the tongue every 5 (five) minutes as needed for chest pain.    . pantoprazole (PROTONIX) 40 MG tablet Take 40 mg by mouth daily.    . simethicone (GAS-X) 80 MG chewable tablet Chew 80 mg by mouth every 6 (six) hours as needed for flatulence.    . Tracheostomy Care KIT 1 kit by Does not apply route daily. 30 each 6  . traMADol (ULTRAM) 50 MG tablet Take 1 tablet by mouth 2 (two) times daily.    . traZODone (DESYREL) 50 MG tablet Take 50 mg by mouth at bedtime.    Marland Kitchen warfarin (COUMADIN) 5 MG tablet Take 2.5-5 mg by mouth See admin instructions. Pt takes 2.24m on Sundays - pt takes 526mall other days, MANAGED BY PMD     No current facility-administered medications for this visit.     Allergies:   Penicillins    ROS:  Please see the history of present illness.   Otherwise, review of systems are positive for none.   All other systems are reviewed and negative.    PHYSICAL EXAM: VS:  BP 118/78   Pulse 62   Ht 4' 9"  (1.448 m)   Wt 208 lb 12.8 oz (94.7 kg)   SpO2 91% Comment: 2.5 L/min, usually 3-4 at home  BMI 45.18 kg/m  , BMI Body mass index is 45.18 kg/m. GENERAL:  Well appearing HEENT:  Pupils equal round and reactive, fundi not visualized, oral mucosa unremarkable, trach NECK:  No jugular venous distention, waveform within normal limits, carotid upstroke brisk and symmetric, no bruits, no thyromegaly LUNGS:  Clear to auscultation bilaterally BACK:  No CVA tenderness CHEST:  Unremarkable, Well healed sternotomy scar. HEART:  PMI not displaced or sustained,S1 and S2 within normal limits, no S3, no S4, no clicks, no rubs, no murmurs ABD:  Flat, positive bowel sounds normal in frequency in pitch, no bruits, no rebound, no guarding, no midline pulsatile mass, no hepatomegaly, no splenomegaly EXT:  2 plus pulses throughout, no edema, no cyanosis no clubbing   EKG:  EKG is ordered today. The ekg ordered today demonstrates sinus rhythm, rate 62, axis within normal  limits, intervals within normal limits, nonspecific ST-T wave changes.   Recent Labs: No results found for requested labs within last 8760 hours.    Lipid Panel    Component Value Date/Time   CHOL 216 (H) 10/30/2015 0445   TRIG 141 10/30/2015 0445   HDL 54 10/30/2015 0445   CHOLHDL 4.0 10/30/2015 0445   VLDL 28 10/30/2015 0445   LDLCALC 134 (H) 10/30/2015 0445      Wt Readings from Last 3 Encounters:  02/23/17 208 lb 12.8 oz (94.7 kg)  12/21/16 209 lb 12.8 oz (95.2 kg)  03/02/16 219 lb (99.3 kg)      Other studies  Reviewed: Additional studies/ records that were reviewed today include: None. Review of the above records demonstrates:  Please see elsewhere in the note.     ASSESSMENT AND PLAN:  CAD:  The patient has no new sypmtoms.  No further cardiovascular testing is indicated.  We will continue with aggressive risk reduction and meds as listed. She needs to be on both DOAC and Plavix given her combination of problems and past history.  Essential HTN:   The blood pressure is at target. No change in medications is indicated. We will continue with therapeutic lifestyle changes (TLC).  Hyperlipidemia:   This is followed by Monico Blitz, MD   She will continue Lipitor 80 mg.  Lupus anticoagulant with DVT:   Continue warfarin indefinitely.  Tobacco abuse:    We discussed this and she is just not able to quit.  Chronic diastolic heart failure:    She has had some increased edema.  We discussed daily weights. She's already salt restricting. She will continue diuretic and when necessary dosing as needed. I will check a basic metabolic profile today.    Current medicines are reviewed at length with the patient today.  The patient does not have concerns regarding medicines.  The following changes have been made:  no change  Labs/ tests ordered today include:   Orders Placed This Encounter  Procedures  . Basic Metabolic Panel (BMET)  . EKG 12-Lead     Disposition:    FU with me in one year.     Signed, [name], MD  02/24/2017 12:38 PM    [facility]

## 2017-02-23 ENCOUNTER — Encounter: Payer: Self-pay | Admitting: Cardiology

## 2017-02-23 ENCOUNTER — Ambulatory Visit (INDEPENDENT_AMBULATORY_CARE_PROVIDER_SITE_OTHER): Payer: Medicare Other | Admitting: Cardiology

## 2017-02-23 VITALS — BP 118/78 | HR 62 | Ht <= 58 in | Wt 208.8 lb

## 2017-02-23 DIAGNOSIS — E785 Hyperlipidemia, unspecified: Secondary | ICD-10-CM

## 2017-02-23 DIAGNOSIS — I10 Essential (primary) hypertension: Secondary | ICD-10-CM

## 2017-02-23 DIAGNOSIS — I2510 Atherosclerotic heart disease of native coronary artery without angina pectoris: Secondary | ICD-10-CM

## 2017-02-23 DIAGNOSIS — Z720 Tobacco use: Secondary | ICD-10-CM | POA: Diagnosis not present

## 2017-02-23 NOTE — Patient Instructions (Signed)
Medication Instructions:  Your physician recommends that you continue on your current medications as directed. Please refer to the Current Medication list given to you today.  Labwork: BMET  Testing/Procedures: NONE  Follow-Up: Your physician wants you to follow-up in: [place]. [place]. You will receive a reminder letter in the mail two months in advance. If you don't receive a letter, please call our office to schedule the follow-up appointment.  Any Other Special Instructions Will Be Listed Below (If Applicable).  If you need a refill on your cardiac medications before your next appointment, please call your pharmacy.

## 2017-02-24 ENCOUNTER — Encounter: Payer: Self-pay | Admitting: Cardiology

## 2017-04-05 ENCOUNTER — Other Ambulatory Visit: Payer: Self-pay | Admitting: Cardiology

## 2017-05-06 ENCOUNTER — Other Ambulatory Visit: Payer: Self-pay | Admitting: Cardiovascular Disease

## 2017-05-23 DIAGNOSIS — I10 Essential (primary) hypertension: Secondary | ICD-10-CM | POA: Diagnosis not present

## 2017-05-23 DIAGNOSIS — E1142 Type 2 diabetes mellitus with diabetic polyneuropathy: Secondary | ICD-10-CM | POA: Diagnosis not present

## 2017-05-23 DIAGNOSIS — I82409 Acute embolism and thrombosis of unspecified deep veins of unspecified lower extremity: Secondary | ICD-10-CM | POA: Diagnosis not present

## 2017-05-23 DIAGNOSIS — J441 Chronic obstructive pulmonary disease with (acute) exacerbation: Secondary | ICD-10-CM | POA: Diagnosis not present

## 2017-05-23 DIAGNOSIS — Z6841 Body Mass Index (BMI) 40.0 and over, adult: Secondary | ICD-10-CM | POA: Diagnosis not present

## 2017-05-23 DIAGNOSIS — Z299 Encounter for prophylactic measures, unspecified: Secondary | ICD-10-CM | POA: Diagnosis not present

## 2017-05-23 DIAGNOSIS — J449 Chronic obstructive pulmonary disease, unspecified: Secondary | ICD-10-CM | POA: Diagnosis not present

## 2017-05-23 DIAGNOSIS — M159 Polyosteoarthritis, unspecified: Secondary | ICD-10-CM | POA: Diagnosis not present

## 2017-05-23 DIAGNOSIS — E119 Type 2 diabetes mellitus without complications: Secondary | ICD-10-CM | POA: Diagnosis not present

## 2017-05-23 DIAGNOSIS — I2510 Atherosclerotic heart disease of native coronary artery without angina pectoris: Secondary | ICD-10-CM | POA: Diagnosis not present

## 2017-05-23 DIAGNOSIS — E1165 Type 2 diabetes mellitus with hyperglycemia: Secondary | ICD-10-CM | POA: Diagnosis not present

## 2017-07-08 DIAGNOSIS — I10 Essential (primary) hypertension: Secondary | ICD-10-CM | POA: Diagnosis not present

## 2017-07-08 DIAGNOSIS — E119 Type 2 diabetes mellitus without complications: Secondary | ICD-10-CM | POA: Diagnosis not present

## 2017-07-08 DIAGNOSIS — M159 Polyosteoarthritis, unspecified: Secondary | ICD-10-CM | POA: Diagnosis not present

## 2017-07-08 DIAGNOSIS — I2510 Atherosclerotic heart disease of native coronary artery without angina pectoris: Secondary | ICD-10-CM | POA: Diagnosis not present

## 2017-07-31 ENCOUNTER — Emergency Department (HOSPITAL_COMMUNITY): Payer: Medicare Other

## 2017-07-31 ENCOUNTER — Inpatient Hospital Stay (HOSPITAL_COMMUNITY)
Admission: EM | Admit: 2017-07-31 | Discharge: 2017-08-04 | DRG: 190 | Disposition: A | Discharge status: Home Health | Payer: Medicare Other | Attending: Family Medicine | Admitting: Family Medicine

## 2017-07-31 ENCOUNTER — Encounter (HOSPITAL_COMMUNITY): Payer: Self-pay

## 2017-07-31 ENCOUNTER — Other Ambulatory Visit: Payer: Self-pay

## 2017-07-31 DIAGNOSIS — E1165 Type 2 diabetes mellitus with hyperglycemia: Secondary | ICD-10-CM | POA: Diagnosis not present

## 2017-07-31 DIAGNOSIS — Z8541 Personal history of malignant neoplasm of cervix uteri: Secondary | ICD-10-CM

## 2017-07-31 DIAGNOSIS — I509 Heart failure, unspecified: Secondary | ICD-10-CM | POA: Diagnosis not present

## 2017-07-31 DIAGNOSIS — Z79899 Other long term (current) drug therapy: Secondary | ICD-10-CM

## 2017-07-31 DIAGNOSIS — I25810 Atherosclerosis of coronary artery bypass graft(s) without angina pectoris: Secondary | ICD-10-CM | POA: Diagnosis not present

## 2017-07-31 DIAGNOSIS — R0902 Hypoxemia: Secondary | ICD-10-CM

## 2017-07-31 DIAGNOSIS — D3502 Benign neoplasm of left adrenal gland: Secondary | ICD-10-CM | POA: Diagnosis present

## 2017-07-31 DIAGNOSIS — G8929 Other chronic pain: Secondary | ICD-10-CM | POA: Diagnosis present

## 2017-07-31 DIAGNOSIS — R0600 Dyspnea, unspecified: Secondary | ICD-10-CM | POA: Diagnosis present

## 2017-07-31 DIAGNOSIS — I252 Old myocardial infarction: Secondary | ICD-10-CM

## 2017-07-31 DIAGNOSIS — Z720 Tobacco use: Secondary | ICD-10-CM | POA: Diagnosis present

## 2017-07-31 DIAGNOSIS — M329 Systemic lupus erythematosus, unspecified: Secondary | ICD-10-CM | POA: Diagnosis present

## 2017-07-31 DIAGNOSIS — J9690 Respiratory failure, unspecified, unspecified whether with hypoxia or hypercapnia: Secondary | ICD-10-CM | POA: Diagnosis present

## 2017-07-31 DIAGNOSIS — E785 Hyperlipidemia, unspecified: Secondary | ICD-10-CM | POA: Diagnosis present

## 2017-07-31 DIAGNOSIS — I5033 Acute on chronic diastolic (congestive) heart failure: Secondary | ICD-10-CM | POA: Diagnosis not present

## 2017-07-31 DIAGNOSIS — Z951 Presence of aortocoronary bypass graft: Secondary | ICD-10-CM

## 2017-07-31 DIAGNOSIS — N809 Endometriosis, unspecified: Secondary | ICD-10-CM | POA: Diagnosis present

## 2017-07-31 DIAGNOSIS — Z6841 Body Mass Index (BMI) 40.0 and over, adult: Secondary | ICD-10-CM | POA: Diagnosis not present

## 2017-07-31 DIAGNOSIS — J9601 Acute respiratory failure with hypoxia: Secondary | ICD-10-CM | POA: Diagnosis not present

## 2017-07-31 DIAGNOSIS — E119 Type 2 diabetes mellitus without complications: Secondary | ICD-10-CM

## 2017-07-31 DIAGNOSIS — J3800 Paralysis of vocal cords and larynx, unspecified: Secondary | ICD-10-CM | POA: Diagnosis present

## 2017-07-31 DIAGNOSIS — E1100 Type 2 diabetes mellitus with hyperosmolarity without nonketotic hyperglycemic-hyperosmolar coma (NKHHC): Secondary | ICD-10-CM | POA: Diagnosis not present

## 2017-07-31 DIAGNOSIS — I2510 Atherosclerotic heart disease of native coronary artery without angina pectoris: Secondary | ICD-10-CM | POA: Diagnosis not present

## 2017-07-31 DIAGNOSIS — Z9981 Dependence on supplemental oxygen: Secondary | ICD-10-CM

## 2017-07-31 DIAGNOSIS — Z930 Tracheostomy status: Secondary | ICD-10-CM | POA: Diagnosis not present

## 2017-07-31 DIAGNOSIS — Z8249 Family history of ischemic heart disease and other diseases of the circulatory system: Secondary | ICD-10-CM

## 2017-07-31 DIAGNOSIS — K8020 Calculus of gallbladder without cholecystitis without obstruction: Secondary | ICD-10-CM | POA: Diagnosis present

## 2017-07-31 DIAGNOSIS — Z794 Long term (current) use of insulin: Secondary | ICD-10-CM | POA: Diagnosis not present

## 2017-07-31 DIAGNOSIS — I110 Hypertensive heart disease with heart failure: Secondary | ICD-10-CM | POA: Diagnosis not present

## 2017-07-31 DIAGNOSIS — I10 Essential (primary) hypertension: Secondary | ICD-10-CM | POA: Diagnosis present

## 2017-07-31 DIAGNOSIS — I081 Rheumatic disorders of both mitral and tricuspid valves: Secondary | ICD-10-CM | POA: Diagnosis present

## 2017-07-31 DIAGNOSIS — Z7901 Long term (current) use of anticoagulants: Secondary | ICD-10-CM

## 2017-07-31 DIAGNOSIS — E669 Obesity, unspecified: Secondary | ICD-10-CM | POA: Diagnosis present

## 2017-07-31 DIAGNOSIS — R0602 Shortness of breath: Secondary | ICD-10-CM | POA: Diagnosis not present

## 2017-07-31 DIAGNOSIS — D6862 Lupus anticoagulant syndrome: Secondary | ICD-10-CM | POA: Diagnosis present

## 2017-07-31 DIAGNOSIS — J9621 Acute and chronic respiratory failure with hypoxia: Secondary | ICD-10-CM

## 2017-07-31 DIAGNOSIS — R05 Cough: Secondary | ICD-10-CM | POA: Diagnosis not present

## 2017-07-31 DIAGNOSIS — J441 Chronic obstructive pulmonary disease with (acute) exacerbation: Principal | ICD-10-CM | POA: Diagnosis present

## 2017-07-31 DIAGNOSIS — Z7902 Long term (current) use of antithrombotics/antiplatelets: Secondary | ICD-10-CM

## 2017-07-31 DIAGNOSIS — Z23 Encounter for immunization: Secondary | ICD-10-CM | POA: Diagnosis not present

## 2017-07-31 DIAGNOSIS — Z880 Allergy status to penicillin: Secondary | ICD-10-CM

## 2017-07-31 DIAGNOSIS — F17210 Nicotine dependence, cigarettes, uncomplicated: Secondary | ICD-10-CM | POA: Diagnosis present

## 2017-07-31 DIAGNOSIS — I340 Nonrheumatic mitral (valve) insufficiency: Secondary | ICD-10-CM | POA: Diagnosis not present

## 2017-07-31 DIAGNOSIS — I5030 Unspecified diastolic (congestive) heart failure: Secondary | ICD-10-CM | POA: Insufficient documentation

## 2017-07-31 HISTORY — DX: Other chronic pain: G89.29

## 2017-07-31 HISTORY — DX: Right upper quadrant pain: R10.11

## 2017-07-31 HISTORY — DX: Personal history of other medical treatment: Z92.89

## 2017-07-31 HISTORY — DX: Personal history of other diseases of the digestive system: Z87.19

## 2017-07-31 LAB — COMPREHENSIVE METABOLIC PANEL
ALT: 12 U/L — ABNORMAL LOW (ref 14–54)
AST: 17 U/L (ref 15–41)
Albumin: 3.2 g/dL — ABNORMAL LOW (ref 3.5–5.0)
Alkaline Phosphatase: 100 U/L (ref 38–126)
Anion gap: 9 (ref 5–15)
BUN: 11 mg/dL (ref 6–20)
CHLORIDE: 99 mmol/L — AB (ref 101–111)
CO2: 31 mmol/L (ref 22–32)
Calcium: 9.2 mg/dL (ref 8.9–10.3)
Creatinine, Ser: 0.93 mg/dL (ref 0.44–1.00)
Glucose, Bld: 239 mg/dL — ABNORMAL HIGH (ref 65–99)
Potassium: 3.9 mmol/L (ref 3.5–5.1)
SODIUM: 139 mmol/L (ref 135–145)
Total Bilirubin: 1.7 mg/dL — ABNORMAL HIGH (ref 0.3–1.2)
Total Protein: 7.1 g/dL (ref 6.5–8.1)

## 2017-07-31 LAB — URINALYSIS, ROUTINE W REFLEX MICROSCOPIC
BILIRUBIN URINE: NEGATIVE
Bacteria, UA: NONE SEEN
GLUCOSE, UA: 50 mg/dL — AB
KETONES UR: NEGATIVE mg/dL
LEUKOCYTES UA: NEGATIVE
NITRITE: NEGATIVE
Specific Gravity, Urine: >1.046 — ABNORMAL HIGH (ref 1.005–1.030)
pH: 5 (ref 5.0–8.0)

## 2017-07-31 LAB — I-STAT CG4 LACTIC ACID, ED
LACTIC ACID, VENOUS: 2.53 mmol/L — AB (ref 0.5–1.9)
Lactic Acid, Venous: 2.9 mmol/L (ref 0.5–1.9)

## 2017-07-31 LAB — PROTIME-INR
INR: 1.49
Prothrombin Time: 17.9 seconds — ABNORMAL HIGH (ref 11.4–15.2)

## 2017-07-31 LAB — CBC WITH DIFFERENTIAL/PLATELET
BASOS ABS: 0 10*3/uL (ref 0.0–0.1)
BASOS PCT: 0 %
Eosinophils Absolute: 0 10*3/uL (ref 0.0–0.7)
Eosinophils Relative: 0 %
HEMATOCRIT: 44 % (ref 36.0–46.0)
HEMOGLOBIN: 13.5 g/dL (ref 12.0–15.0)
LYMPHS PCT: 15 %
Lymphs Abs: 1 10*3/uL (ref 0.7–4.0)
MCH: 30.1 pg (ref 26.0–34.0)
MCHC: 30.7 g/dL (ref 30.0–36.0)
MCV: 98 fL (ref 78.0–100.0)
Monocytes Absolute: 0.5 10*3/uL (ref 0.1–1.0)
Monocytes Relative: 7 %
NEUTROS ABS: 5.3 10*3/uL (ref 1.7–7.7)
NEUTROS PCT: 78 %
Platelets: 194 10*3/uL (ref 150–400)
RBC: 4.49 MIL/uL (ref 3.87–5.11)
RDW: 16.6 % — ABNORMAL HIGH (ref 11.5–15.5)
WBC: 6.9 10*3/uL (ref 4.0–10.5)

## 2017-07-31 LAB — BRAIN NATRIURETIC PEPTIDE: B NATRIURETIC PEPTIDE 5: 1089 pg/mL — AB (ref 0.0–100.0)

## 2017-07-31 LAB — GLUCOSE, CAPILLARY: Glucose-Capillary: 323 mg/dL — ABNORMAL HIGH (ref 65–99)

## 2017-07-31 LAB — LIPASE, BLOOD: LIPASE: 22 U/L (ref 11–51)

## 2017-07-31 LAB — TROPONIN I: Troponin I: <0.03 ng/mL (ref <0.03)

## 2017-07-31 MED ORDER — INSULIN ASPART 100 UNIT/ML ~~LOC~~ SOLN: 0.0000 [IU] | Freq: Three times a day (TID) | SUBCUTANEOUS | Status: DC

## 2017-07-31 MED ORDER — NICOTINE 21 MG/24HR TD PT24
21.0000 mg | MEDICATED_PATCH | Freq: Every day | TRANSDERMAL | Status: DC
  Administered 2017-07-31 – 2017-08-04 (×5): 21 mg via TRANSDERMAL
  Filled 2017-07-31 (×5): qty 1

## 2017-07-31 MED ORDER — IOPAMIDOL (ISOVUE-370) INJECTION 76%: 100.0000 mL | Freq: Once | INTRAVENOUS | Status: DC | PRN

## 2017-07-31 MED ORDER — FUROSEMIDE 10 MG/ML IJ SOLN
40.0000 mg | Freq: Two times a day (BID) | INTRAMUSCULAR | Status: DC
  Administered 2017-08-01 – 2017-08-03 (×5): 40 mg via INTRAVENOUS
  Filled 2017-07-31 (×6): qty 4

## 2017-07-31 MED ORDER — ALBUTEROL (5 MG/ML) CONTINUOUS INHALATION SOLN
10.0000 mg/h | INHALATION_SOLUTION | Freq: Once | RESPIRATORY_TRACT | Status: AC
  Administered 2017-07-31: 10 mg/h via RESPIRATORY_TRACT
  Filled 2017-07-31: qty 20

## 2017-07-31 MED ORDER — BUDESONIDE 0.25 MG/2ML IN SUSP
0.2500 mg | Freq: Two times a day (BID) | RESPIRATORY_TRACT | Status: DC
  Administered 2017-07-31 – 2017-08-04 (×8): 0.25 mg via RESPIRATORY_TRACT
  Filled 2017-07-31 (×8): qty 2

## 2017-07-31 MED ORDER — WARFARIN SODIUM 5 MG PO TABS
5.0000 mg | ORAL_TABLET | Freq: Once | ORAL | Status: AC
  Administered 2017-07-31: 5 mg via ORAL
  Filled 2017-07-31: qty 1

## 2017-07-31 MED ORDER — SODIUM CHLORIDE 0.9% FLUSH
3.0000 mL | INTRAVENOUS | Status: DC | PRN
  Administered 2017-08-03: 3 mL via INTRAVENOUS
  Filled 2017-07-31: qty 3

## 2017-07-31 MED ORDER — IPRATROPIUM-ALBUTEROL 0.5-2.5 (3) MG/3ML IN SOLN
3.0000 mL | Freq: Four times a day (QID) | RESPIRATORY_TRACT | Status: DC
  Administered 2017-07-31 – 2017-08-04 (×14): 3 mL via RESPIRATORY_TRACT
  Filled 2017-07-31 (×12): qty 3

## 2017-07-31 MED ORDER — SODIUM CHLORIDE 0.9% FLUSH
3.0000 mL | Freq: Two times a day (BID) | INTRAVENOUS | Status: DC
  Administered 2017-07-31 – 2017-08-04 (×7): 3 mL via INTRAVENOUS

## 2017-07-31 MED ORDER — SODIUM CHLORIDE 0.9 % IV SOLN: 250.0000 mL | INTRAVENOUS | Status: DC | PRN

## 2017-07-31 MED ORDER — FUROSEMIDE 10 MG/ML IJ SOLN
40.0000 mg | Freq: Once | INTRAMUSCULAR | Status: AC
  Administered 2017-07-31: 40 mg via INTRAVENOUS
  Filled 2017-07-31: qty 4

## 2017-07-31 MED ORDER — ENOXAPARIN SODIUM 100 MG/ML ~~LOC~~ SOLN
100.0000 mg | Freq: Two times a day (BID) | SUBCUTANEOUS | Status: DC
  Administered 2017-07-31 – 2017-08-02 (×4): 100 mg via SUBCUTANEOUS
  Filled 2017-07-31 (×4): qty 1

## 2017-07-31 MED ORDER — LISINOPRIL 10 MG PO TABS
20.0000 mg | ORAL_TABLET | Freq: Every day | ORAL | Status: DC
  Administered 2017-07-31 – 2017-08-04 (×5): 20 mg via ORAL
  Filled 2017-07-31 (×5): qty 2

## 2017-07-31 MED ORDER — IPRATROPIUM BROMIDE 0.02 % IN SOLN
1.0000 mg | Freq: Once | RESPIRATORY_TRACT | Status: AC
  Administered 2017-07-31: 1 mg via RESPIRATORY_TRACT
  Filled 2017-07-31: qty 5

## 2017-07-31 MED ORDER — INSULIN GLARGINE 100 UNIT/ML ~~LOC~~ SOLN
5.0000 [IU] | Freq: Every day | SUBCUTANEOUS | Status: DC
  Administered 2017-07-31: 5 [IU] via SUBCUTANEOUS
  Filled 2017-07-31: qty 0.05

## 2017-07-31 MED ORDER — ASPIRIN EC 81 MG PO TBEC
81.0000 mg | DELAYED_RELEASE_TABLET | Freq: Every day | ORAL | Status: DC
  Administered 2017-07-31 – 2017-08-04 (×5): 81 mg via ORAL
  Filled 2017-07-31 (×5): qty 1

## 2017-07-31 MED ORDER — CLOPIDOGREL BISULFATE 75 MG PO TABS
75.0000 mg | ORAL_TABLET | Freq: Every day | ORAL | Status: DC
  Administered 2017-07-31 – 2017-08-04 (×5): 75 mg via ORAL
  Filled 2017-07-31 (×5): qty 1

## 2017-07-31 MED ORDER — ACETAMINOPHEN 325 MG PO TABS: 650.0000 mg | ORAL_TABLET | ORAL | Status: DC | PRN

## 2017-07-31 MED ORDER — SODIUM CHLORIDE 0.9 % IV BOLUS (SEPSIS)
250.0000 mL | Freq: Once | INTRAVENOUS | Status: AC
Start: 2017-07-31 — End: 2017-07-31
  Administered 2017-07-31: 250 mL via INTRAVENOUS

## 2017-07-31 MED ORDER — HYDRALAZINE HCL 20 MG/ML IJ SOLN: 20.0000 mg | INTRAMUSCULAR | Status: DC | PRN

## 2017-07-31 MED ORDER — WARFARIN SODIUM 5 MG PO TABS: 2.5000 mg | ORAL_TABLET | ORAL | Status: DC

## 2017-07-31 MED ORDER — METHYLPREDNISOLONE SODIUM SUCC 125 MG IJ SOLR
125.0000 mg | Freq: Once | INTRAMUSCULAR | Status: AC
  Administered 2017-07-31: 125 mg via INTRAVENOUS
  Filled 2017-07-31: qty 2

## 2017-07-31 MED ORDER — METOPROLOL TARTRATE 50 MG PO TABS
50.0000 mg | ORAL_TABLET | Freq: Two times a day (BID) | ORAL | Status: DC
  Administered 2017-07-31 – 2017-08-04 (×8): 50 mg via ORAL
  Filled 2017-07-31 (×8): qty 1

## 2017-07-31 MED ORDER — GABAPENTIN 300 MG PO CAPS
300.0000 mg | ORAL_CAPSULE | Freq: Two times a day (BID) | ORAL | Status: DC
Start: 2017-07-31 — End: 2017-08-04
  Administered 2017-07-31 – 2017-08-04 (×8): 300 mg via ORAL
  Filled 2017-07-31 (×8): qty 1

## 2017-07-31 MED ORDER — ONDANSETRON HCL 4 MG/2ML IJ SOLN: 4.0000 mg | Freq: Four times a day (QID) | INTRAMUSCULAR | Status: DC | PRN

## 2017-07-31 MED ORDER — PANTOPRAZOLE SODIUM 40 MG PO TBEC
40.0000 mg | DELAYED_RELEASE_TABLET | Freq: Every day | ORAL | Status: DC
  Administered 2017-07-31 – 2017-08-04 (×5): 40 mg via ORAL
  Filled 2017-07-31 (×5): qty 1

## 2017-07-31 MED ORDER — TRAMADOL HCL 50 MG PO TABS
50.0000 mg | ORAL_TABLET | Freq: Two times a day (BID) | ORAL | Status: DC
  Administered 2017-07-31 – 2017-08-04 (×8): 50 mg via ORAL
  Filled 2017-07-31 (×8): qty 1

## 2017-07-31 MED ORDER — ALBUTEROL SULFATE (2.5 MG/3ML) 0.083% IN NEBU: 2.5000 mg | INHALATION_SOLUTION | RESPIRATORY_TRACT | Status: DC | PRN

## 2017-07-31 MED ORDER — POTASSIUM CHLORIDE CRYS ER 10 MEQ PO TBCR
10.0000 meq | EXTENDED_RELEASE_TABLET | Freq: Two times a day (BID) | ORAL | Status: DC
  Administered 2017-07-31 – 2017-08-02 (×4): 10 meq via ORAL
  Filled 2017-07-31 (×4): qty 1

## 2017-07-31 MED ORDER — WARFARIN - PHARMACIST DOSING INPATIENT
Status: DC
  Administered 2017-08-01 – 2017-08-03 (×3)

## 2017-07-31 MED ORDER — SODIUM CHLORIDE 0.9 % IV SOLN
INTRAVENOUS | Status: DC
Start: 2017-07-31 — End: 2017-08-01
  Administered 2017-07-31: 750 mL via INTRAVENOUS

## 2017-07-31 NOTE — H&P (Addendum)
History and Physical:    Theresa [name]   KWI:[id number] DOB: 06/08/1966 DOA: 07/31/2017   PCP: Monico Blitz, MD   Patient coming from:   Chief Complaint:  SOB, Wheeze  History of Present Illness:   Theresa [name] is an 51 y.o. female with known Diastolic HF and COPD who still smokes, now with increased weight gain (about 30# over 5 months) and increasing lower extremity edema, SOB, wheeze, and difficulty breathing. Came to ER. Found to have some CHF on CT chest,  (no PE noted) and some concern for COPD. One dose of steroids in the ER, doing a little better on higher flow o2 through her trach site (which has been attempted to be reversed twice to no avail due to vocal cord paralysis and stridor).  Now doing a little better.  Denies: Fevers, chills, nausea, emesis, rash, chest pain,  pain with respiration, headache, abdominal pain, skin lesions or rashes, GU discharge, bowel or bladder dysfunction, dysuria, hallucinations, visual changes, taste changes, musculoskeletal swelling or deformity, bleeding problems, or allergy symptoms.   ED Course:  As above.  ROS:  All other ROS are negative except as noted in the HPI above.  Past Medical History:   Past Medical History:  Diagnosis Date  . CAD (coronary artery disease)    s/p 2V CABG in 2007  . CAD (coronary artery disease) of artery bypass graft 11/03/2015  . Cervical cancer ([place])   . CHF (congestive heart failure) ([place])   . Chronic RUQ pain   . COPD (chronic obstructive pulmonary disease) ([place])   . Diabetes mellitus ([place])   . Endometriosis   . History of gallstones 01/2016   seen on Ultrasound  . History of HIDA scan 11/2016   normal  . HTN (hypertension)   . Hyperlipidemia   . Lupus anticoagulant disorder (HCC)    on coumadin  . Respiratory failure ([place])    s/p tracheostomy 2002  . ST elevation (STEMI) myocardial infarction involving right coronary artery ([place]) 10/29/15   stent to VG to PDA  . Tracheostomy in  place [facility]), chronic since 2002 11/03/2015    Past Surgical History:   Past Surgical History:  Procedure Laterality Date  . CARDIAC CATHETERIZATION N/A 10/29/2015   Procedure: Left Heart Cath and Cors/Grafts Angiography;  Surgeon: Burnell Blanks, MD;  Location: [place] [facility];  Service: Cardiovascular;  Laterality: N/A;  . CARDIAC CATHETERIZATION  10/29/2015   Procedure: Coronary Stent Intervention;  Surgeon: Burnell Blanks, MD;  Location: [place] [facility];  Service: Cardiovascular;;  . CAROTID STENT    . CESAREAN SECTION WITH BILATERAL TUBAL LIGATION    . CORONARY ARTERY BYPASS GRAFT  2007   2V  . TRACHEOSTOMY      Social History:   Social History   Socioeconomic History  . Marital status: Married    Spouse name: Not on file  . Number of children: Not on file  . Years of education: Not on file  . Highest education level: Not on file  Social Needs  . Financial resource strain: Not on file  . Food insecurity - worry: Not on file  . Food insecurity - inability: Not on file  . Transportation needs - medical: Not on file  . Transportation needs - non-medical: Not on file  Occupational History  . Not on file  Tobacco Use  . Smoking status: Current Every Day Smoker    Packs/day: 0.75    Types: Cigarettes  Start date: 10/23/1978  . Smokeless tobacco: Never Used  Substance and Sexual Activity  . Alcohol use: No    Alcohol/week: 0.0 oz  . Drug use: No  . Sexual activity: Not on file  Other Topics Concern  . Not on file  Social History Narrative  . Not on file    Allergies   Penicillins  Family history:   Family History  Problem Relation Age of Onset  . Hypertension Mother     Current Medications:   Prior to Admission medications   Medication Sig Start Date End Date Taking? Authorizing Provider  albuterol (PROVENTIL HFA;VENTOLIN HFA) 108 (90 Base) MCG/ACT inhaler Inhale 1-2 puffs into the lungs every 6 (six) hours as needed for wheezing or  shortness of breath.   Yes [provider]  albuterol (PROVENTIL) (2.5 MG/3ML) 0.083% nebulizer solution Take 3 mLs (2.5 mg total) by nebulization every 4 (four) hours as needed for wheezing or shortness of breath. 11/17/15  Yes Ollis, Cleaster Corin, NP  clopidogrel (PLAVIX) 75 MG tablet TAKE 1 TABLET BY MOUTH ONCE DAILY **NEED  OFFICE  VISIT** Patient taking differently: TAKE 1 TABLET BY MOUTH ONCE DAILY 04/06/17  Yes [name], MD  furosemide (LASIX) 20 MG tablet Take 40 mg by mouth every morning. & extra as needed   Yes [provider]  gabapentin (NEURONTIN) 300 MG capsule Take 300-600 mg by mouth 2 (two) times daily. 12/31/15  Yes [provider]  insulin degludec (TRESIBA FLEXTOUCH) 100 UNIT/ML SOPN FlexTouch Pen Inject 25 Units into the skin daily at 10 pm.   Yes [provider]  lisinopril (PRINIVIL,ZESTRIL) 20 MG tablet TAKE ONE TABLET BY MOUTH ONCE DAILY 05/10/17  Yes [name], MD  metoprolol (LOPRESSOR) 50 MG tablet Take 50 mg by mouth 2 (two) times daily.   Yes [provider]  nitroGLYCERIN (NITROSTAT) 0.4 MG SL tablet Place 0.4 mg under the tongue every 5 (five) minutes as needed for chest pain.   Yes [provider]  pantoprazole (PROTONIX) 40 MG tablet Take 40 mg by mouth daily.   Yes [provider]  traMADol (ULTRAM) 50 MG tablet Take 1 tablet by mouth 2 (two) times daily. 10/08/16  Yes [provider]  warfarin (COUMADIN) 5 MG tablet Take 2.5-5 mg by mouth See admin instructions. Pt takes 2.43m on Sundays - pt takes 541mall other days, MANAGED BY PMD   Yes [provider]  Tracheostomy Care KIT 1 kit by Does not apply route daily. Patient not taking: Reported on 07/31/2017 12/03/15   BaErick ColaceNP    Physical Exam:   Vitals:   07/31/17 1630 07/31/17 1730 07/31/17 1818 07/31/17 1830  BP: (!) 150/95 (!) 159/92  (!) 159/85  Pulse: 92 95  97  Resp: (!) 26 (!) 38  (!) 32  Temp:      TempSrc:       SpO2: 91% (!) 86% 94% 91%  Weight:      Height:         Physical Exam: Blood pressure (!) 159/85, pulse 97, temperature 98.5 F (36.9 C), temperature source Oral, resp. rate (!) 32, height 4' 9"  (1.448 m), weight 104.3 kg (230 lb), SpO2 91 %. Gen: NAD, WDWN Head: Normocephalic, atraumatic. Eyes: PERRL. EOMI.  Sclerae nonicteric. No lid lag. No foreign body. Mouth: OP without lesions/masses/bleeding, patent posterior OP. Neck: Supple, no thyromegaly, no cervical or submandibular lymphadenopathy or adenitis.    =trach site intact, no issues Pulm:    -  poor AM bilaterally, increased effort, diffuse ins/exp wheeze all lung  Fields, prolonged exp phase (even with mouth closed and breathing  Via her trach).  -questinoable rales in the bases CV: No MRG, 2+ pulses bilaterally upper and lower extremities, no JVD, normal capillary refill (less than 5 seconds) Abdomen: Soft, nontender, nondistended with normal bowel sounds. No hepatosplenomegaly or palpable masses.  No other organomegaly. Extremities: Extremities are without clubbing, or cyanosis. No edema. Pedal pulses 2+. Skin: Warm and dry.     -bilateral lower ext edema  -red patchy rash on legs  -skin dry Neuro: Alert and oriented times 4, moves all extremities equally well and symmetrically, 5/5 strength bilaterally upper and lower extremities, gait and station not tested, cerebellar function intact, no clonus, 2+ DTR in prepatellar bilarterally with normal return. Psych: Insight is good and judgment is appropriate. Mood and affect normal.  Speech is fluent and short term and long term memory intact.   Data Review:    Labs: Basic Metabolic Panel: Recent Labs  Lab 07/31/17 1427  NA 139  K 3.9  CL 99*  CO2 31  GLUCOSE 239*  BUN 11  CREATININE 0.93  CALCIUM 9.2   Liver Function Tests: Recent Labs  Lab 07/31/17 1427  AST 17  ALT 12*  ALKPHOS 100  BILITOT 1.7*  PROT 7.1  ALBUMIN 3.2*   Recent Labs  Lab  07/31/17 1427  LIPASE 22   No results for input(s): AMMONIA in the last 168 hours. CBC: Recent Labs  Lab 07/31/17 1427  WBC 6.9  NEUTROABS 5.3  HGB 13.5  HCT 44.0  MCV 98.0  PLT 194   Cardiac Enzymes: Recent Labs  Lab 07/31/17 1427  TROPONINI <0.03    BNP (last 3 results) No results for input(s): PROBNP in the last 8760 hours. CBG: No results for input(s): GLUCAP in the last 168 hours.  Urinalysis    Component Value Date/Time   COLORURINE AMBER (A) 07/31/2017 1810   APPEARANCEUR HAZY (A) 07/31/2017 1810   LABSPEC >1.046 (H) 07/31/2017 1810   PHURINE 5.0 07/31/2017 1810   GLUCOSEU 50 (A) 07/31/2017 1810   HGBUR SMALL (A) 07/31/2017 1810   BILIRUBINUR NEGATIVE 07/31/2017 1810   KETONESUR NEGATIVE 07/31/2017 1810   PROTEINUR >=300 (A) 07/31/2017 1810   NITRITE NEGATIVE 07/31/2017 1810   LEUKOCYTESUR NEGATIVE 07/31/2017 1810      Radiographic Studies: Ct Angio Chest Pe W/cm &/or Wo Cm  Result Date: 07/31/2017 CLINICAL DATA:  Dyspnea, cough and congestion with right upper quadrant pain since yesterday. EXAM: CT ANGIOGRAPHY CHEST CT ABDOMEN AND PELVIS WITH CONTRAST TECHNIQUE: Multidetector CT imaging of the chest was performed using the standard protocol during bolus administration of intravenous contrast. Multiplanar CT image reconstructions and MIPs were obtained to evaluate the vascular anatomy. Multidetector CT imaging of the abdomen and pelvis was performed using the standard protocol during bolus administration of intravenous contrast. CONTRAST:  100 cc Isovue 370 IV COMPARISON:  CXR 10/27/2016 and 07/31/2017, chest CT 01/23/2016 FINDINGS: CTA CHEST FINDINGS Cardiovascular: There is cardiomegaly without pericardial effusion. No acute pulmonary embolus. Three-vessel coronary arteriosclerosis is noted with post CABG change. Normal caliber to aorta with minimal aortic atherosclerosis. No dissection. Mediastinum/Nodes: Right upper paratracheal 2.5 cm short axis and  prevascular 1.5 cm short axis lymphadenopathy with smaller prevascular and paratracheal lymph nodes present. Subcarinal lymphadenopathy the 2.1 cm short axis. Mild hilar lymphadenopathy is also noted more so on the right. Tracheostomy tube is noted with tip in the upper trachea. The  mainstem bronchi are patent. No esophageal abnormality. Lungs/Pleura: Interlobular septal thickening with diffuse bilateral upper lobe predominant ground-glass opacities consistent stigmata of CHF. Streaky atelectasis and/or scarring is seen predominantly in the lower lobes. Small amount of fluid in the right minor and major fissures. Peribronchial thickening is also noted to the lower lobes, right middle lobe and lingula consistent with chronic bronchitic change. Musculoskeletal: Degenerative changes are seen along the dorsal spine. No acute nor suspicious osseous lesions. Review of the MIP images confirms the above findings. CT ABDOMEN and PELVIS FINDINGS Hepatobiliary: Hepatic steatosis without focal mass or biliary dilatation. 8 mm gallstone near the neck of the gallbladder without secondary signs of acute cholecystitis. Pancreas: Normal Spleen: Normal size spleen without mass. Adrenals/Urinary Tract: Lobular, low-density left adrenal mass measuring 3.7 x 2.4 cm on series 12 image 21, is stable in appearance. Based on Hounsfield units, findings would be in keeping with an adenoma. No right adrenal mass. No obstructive uropathy. Mild right lower pole cortical irregularity suspicious for scarring. No obstructive uropathy. Nondistended urinary bladder. Stomach/Bowel: Physiologic distention of the stomach. Normal small bowel rotation without inflammation or obstruction. Mild submucosal fatty change of the cecum and ascending colon that may reflect stigmata of chronic inflammatory bowel. Normal-appearing appendix. Scattered diverticulosis along the distal descending and sigmoid colon without acute diverticulitis. Vascular/Lymphatic:  Aortoiliac and branch vessel atherosclerosis without aneurysm. No lymphadenopathy by CT size criteria. Reproductive: Uterus and bilateral adnexa are unremarkable. Other: Soft tissue anasarca.  No ascites or free air. Musculoskeletal: No aggressive lytic or blastic disease. Mild degenerative change along the lower thoracic spine. No acute nor suspicious osseous abnormality Review of the MIP images confirms the above findings. IMPRESSION: 1. The patient is status post CABG. There is stable cardiomegaly with coronary arteriosclerosis re- demonstrated. 2. Nonspecific mediastinal and right hilar lymphadenopathy slightly increased in prominence along the aortic arch and subcarinal portion since prior question chronic reactive adenopathy. 3. No acute pulmonary embolus. 4. Stigmata of CHF with interlobular septal thickening and ground-glass opacities predominantly in the upper lobes. Chronic bronchitic change of the lungs with peribronchial thickening re-demonstrated and diffuse scattered subsegmental areas of atelectasis at the lung bases. 5. Hepatic steatosis. 6. Uncomplicated cholelithiasis. 7. Stable lobular hypodense left adrenal mass consistent with an adenoma measuring 3.7 x 2.4 cm currently. 8. Colonic diverticulosis.  No acute diverticulitis. 9. Mild soft tissue anasarca. Electronically Signed   By: Ashley Royalty M.D.   On: 07/31/2017 17:59   Ct Abdomen Pelvis W Contrast  Result Date: 07/31/2017 CLINICAL DATA:  Dyspnea, cough and congestion with right upper quadrant pain since yesterday. EXAM: CT ANGIOGRAPHY CHEST CT ABDOMEN AND PELVIS WITH CONTRAST TECHNIQUE: Multidetector CT imaging of the chest was performed using the standard protocol during bolus administration of intravenous contrast. Multiplanar CT image reconstructions and MIPs were obtained to evaluate the vascular anatomy. Multidetector CT imaging of the abdomen and pelvis was performed using the standard protocol during bolus administration of  intravenous contrast. CONTRAST:  100 cc Isovue 370 IV COMPARISON:  CXR 10/27/2016 and 07/31/2017, chest CT 01/23/2016 FINDINGS: CTA CHEST FINDINGS Cardiovascular: There is cardiomegaly without pericardial effusion. No acute pulmonary embolus. Three-vessel coronary arteriosclerosis is noted with post CABG change. Normal caliber to aorta with minimal aortic atherosclerosis. No dissection. Mediastinum/Nodes: Right upper paratracheal 2.5 cm short axis and prevascular 1.5 cm short axis lymphadenopathy with smaller prevascular and paratracheal lymph nodes present. Subcarinal lymphadenopathy the 2.1 cm short axis. Mild hilar lymphadenopathy is also noted more so on the right. Tracheostomy  tube is noted with tip in the upper trachea. The mainstem bronchi are patent. No esophageal abnormality. Lungs/Pleura: Interlobular septal thickening with diffuse bilateral upper lobe predominant ground-glass opacities consistent stigmata of CHF. Streaky atelectasis and/or scarring is seen predominantly in the lower lobes. Small amount of fluid in the right minor and major fissures. Peribronchial thickening is also noted to the lower lobes, right middle lobe and lingula consistent with chronic bronchitic change. Musculoskeletal: Degenerative changes are seen along the dorsal spine. No acute nor suspicious osseous lesions. Review of the MIP images confirms the above findings. CT ABDOMEN and PELVIS FINDINGS Hepatobiliary: Hepatic steatosis without focal mass or biliary dilatation. 8 mm gallstone near the neck of the gallbladder without secondary signs of acute cholecystitis. Pancreas: Normal Spleen: Normal size spleen without mass. Adrenals/Urinary Tract: Lobular, low-density left adrenal mass measuring 3.7 x 2.4 cm on series 12 image 21, is stable in appearance. Based on Hounsfield units, findings would be in keeping with an adenoma. No right adrenal mass. No obstructive uropathy. Mild right lower pole cortical irregularity suspicious for  scarring. No obstructive uropathy. Nondistended urinary bladder. Stomach/Bowel: Physiologic distention of the stomach. Normal small bowel rotation without inflammation or obstruction. Mild submucosal fatty change of the cecum and ascending colon that may reflect stigmata of chronic inflammatory bowel. Normal-appearing appendix. Scattered diverticulosis along the distal descending and sigmoid colon without acute diverticulitis. Vascular/Lymphatic: Aortoiliac and branch vessel atherosclerosis without aneurysm. No lymphadenopathy by CT size criteria. Reproductive: Uterus and bilateral adnexa are unremarkable. Other: Soft tissue anasarca.  No ascites or free air. Musculoskeletal: No aggressive lytic or blastic disease. Mild degenerative change along the lower thoracic spine. No acute nor suspicious osseous abnormality Review of the MIP images confirms the above findings. IMPRESSION: 1. The patient is status post CABG. There is stable cardiomegaly with coronary arteriosclerosis re- demonstrated. 2. Nonspecific mediastinal and right hilar lymphadenopathy slightly increased in prominence along the aortic arch and subcarinal portion since prior question chronic reactive adenopathy. 3. No acute pulmonary embolus. 4. Stigmata of CHF with interlobular septal thickening and ground-glass opacities predominantly in the upper lobes. Chronic bronchitic change of the lungs with peribronchial thickening re-demonstrated and diffuse scattered subsegmental areas of atelectasis at the lung bases. 5. Hepatic steatosis. 6. Uncomplicated cholelithiasis. 7. Stable lobular hypodense left adrenal mass consistent with an adenoma measuring 3.7 x 2.4 cm currently. 8. Colonic diverticulosis.  No acute diverticulitis. 9. Mild soft tissue anasarca. Electronically Signed   By: Ashley Royalty M.D.   On: 07/31/2017 17:59   Dg Chest Port 1 View  Result Date: 07/31/2017 CLINICAL DATA:  Shortness of breath, cough. EXAM: PORTABLE CHEST 1 VIEW COMPARISON:   Radiograph of October 27, 2016. FINDINGS: Stable cardiomegaly and central pulmonary vascular congestion. Tracheostomy is in grossly good position. Sternotomy wires are noted. Probable bilateral pulmonary edema is noted. Fluid is noted in the right minor fissure. Bony thorax is unremarkable. Minimal bilateral pleural effusions are noted. IMPRESSION: Stable cardiomegaly and central pulmonary vascular congestion. Mild bilateral pulmonary edema is noted with minimal pleural effusions. Electronically Signed   By: Marijo Conception, M.D.   On: 07/31/2017 15:18    EKG: Independently reviewed.    Assessment/Plan:   Active Problems:   DM (diabetes mellitus) ([place])   Hyperlipidemia LDL goal <70   Essential hypertension   CAD, NATIVE VESSEL   Tobacco abuse   Respiratory failure (HCC)   Acute respiratory failure with hypoxemia (HCC)   CHF (congestive heart failure) (HCC)   Dyspnea  1.  Acute Diastolic HF  -check Echo  -lasix 40 mg iv bid  -watch salt intake  -monitor weights  -treat concommitent COPD (hold po/iv steroids for now- she   Did get one dose in ER, I believe)  -ACEi/BB 2. Acute on chronic COPD  -inhaled steroids (she doesn't take at home)  -nebs  -follow  -rec cig cessation 3. Acute hypoxemic resp failure  -as above  -o2 4. SLE  -lovenox while awaiting INR to come up some  -continue coumadin daily, follow INR  -otherwise no change 5. Tob abuse  -rec cessation  -nicoderm for w/d sxs 6. DM2, controlled  -rec SSI 7. Adrenal Mass  -adrenal adenoma most likely, consider outpt evaluation 8. Symptomatic RUQ pain related to Gallstones  -workup outpt already ongoing, pending surgical eval  -stable at this time   Body mass index is 49.77 kg/m.  Other information:   DVT prophylaxis: ordered. Code Status: Full code. Family Communication: husband Disposition Plan: home Consults called: none Admission status: admit    Tressie Ellis Shonette Rhames [facility]  07/31/2017, 7:15 PM

## 2017-07-31 NOTE — ED Notes (Signed)
Patient in CT

## 2017-07-31 NOTE — ED Triage Notes (Signed)
PAtient reports of shortness of breath, cough, congestion and RUQ pain since yesterday. Patient is trach patient on 3 LPM of home oxygen.

## 2017-07-31 NOTE — Progress Notes (Signed)
ANTICOAGULATION CONSULT NOTE - Initial Consult  Pharmacy Consult for Warfarin (home med) Indication: lupus anticoagulant d/o  Allergies  Allergen Reactions  . Penicillins Rash    Has patient had a PCN reaction causing immediate rash, facial/tongue/throat swelling, SOB or lightheadedness with hypotension: Yes Has patient had a PCN reaction causing severe rash involving mucus membranes or skin necrosis: No Has patient had a PCN reaction that required hospitalization No Has patient had a PCN reaction occurring within the last 10 years: No If all of the above answers are "NO", then may proceed with Cephalosporin use.   REACTION: rash    Patient Measurements: Height: 4\' 9"  (144.8 cm) Weight: 230 lb (104.3 kg) IBW/kg (Calculated) : 38.6  Vital Signs: Temp: 98.5 F (36.9 C) (11/25 1410) Temp Source: Oral (11/25 1410) BP: 158/93 (11/25 2000) Pulse Rate: 99 (11/25 2000)  Labs: Recent Labs    07/31/17 1427  HGB 13.5  HCT 44.0  PLT 194  LABPROT 17.9*  INR 1.49  CREATININE 0.93  TROPONINI <0.03   Estimated Creatinine Clearance: 73.3 mL/min (by C-G formula based on SCr of 0.93 mg/dL).  Medical History: Past Medical History:  Diagnosis Date  . CAD (coronary artery disease)    s/p 2V CABG in 2007  . CAD (coronary artery disease) of artery bypass graft 11/03/2015  . Cervical cancer ([place])   . CHF (congestive heart failure) ([place])   . Chronic RUQ pain   . COPD (chronic obstructive pulmonary disease) ([place])   . Diabetes mellitus ([place])   . Endometriosis   . History of gallstones 01/2016   seen on Ultrasound  . History of HIDA scan 11/2016   normal  . HTN (hypertension)   . Hyperlipidemia   . Lupus anticoagulant disorder (HCC)    on coumadin  . Respiratory failure ([place])    s/p tracheostomy 2002  . ST elevation (STEMI) myocardial infarction involving right coronary artery ([place]) 10/29/15   stent to VG to PDA  . Tracheostomy in place [facility]), chronic since 2002 11/03/2015    Medications:   (Not in a hospital admission)  Assessment: 51 yo female on chronic coumadin.  INR subtherapeutic on admission.    Goal of Therapy:  INR 2-3 Monitor platelets by anticoagulation protocol: Yes   Plan:  Coumadin 5mg  tonight INR daily  Hart Robinsons A 07/31/2017,8:19 PM

## 2017-07-31 NOTE — ED Provider Notes (Signed)
[facility] EMERGENCY DEPARTMENT Provider Note   CSN: [id number] Arrival date & time: 07/31/17  1358     History   Chief Complaint Chief Complaint  Patient presents with  . Shortness of Breath  . Abdominal Pain    HPI Theresa [name] is a 51 y.o. female.  HPI  Pt was seen at 1420. Per pt and her family, c/o gradual onset and worsening of persistent SOB and cough for the past 2 days. Pt used her MDI without relief. Pt also c/o acute flair of her chronic RUQ pain that has been present for the past 10+ months. Pt states she was told "it's my gallbladder" and "it needs to come out." Pt c/o RLQ pain and several episodes of diarrhea since yesterday. Denies fevers, no rash, no CP/palpitations, no black or blood in stools, no N/V, no back pain.   Past Medical History:  Diagnosis Date  . CAD (coronary artery disease)    s/p 2V CABG in 2007  . CAD (coronary artery disease) of artery bypass graft 11/03/2015  . Cervical cancer ([place])   . CHF (congestive heart failure) ([place])   . Chronic RUQ pain   . COPD (chronic obstructive pulmonary disease) ([place])   . Diabetes mellitus ([place])   . Endometriosis   . History of gallstones 01/2016   seen on Ultrasound  . History of HIDA scan 11/2016   normal  . HTN (hypertension)   . Hyperlipidemia   . Lupus anticoagulant disorder (HCC)    on coumadin  . Respiratory failure ([place])    s/p tracheostomy 2002  . ST elevation (STEMI) myocardial infarction involving right coronary artery ([place]) 10/29/15   stent to VG to PDA  . Tracheostomy in place [facility]), chronic since 2002 11/03/2015    Patient Active Problem List   Diagnosis Date Noted  . RUQ pain 12/21/2016  . Nausea & vomiting   . CHF (congestive heart failure) ([place])   . Acute respiratory failure with hypoxia ([place]) 01/22/2016  . Type 2 diabetes mellitus with hyperglycemia ([place]) 01/22/2016  . CAP (community acquired pneumonia) 01/22/2016  . Tracheostomy status ([place])   . CAD (coronary artery disease)  of artery bypass graft: PTCA/DES to distal body VG to PDA 10/29/15 11/03/2015  . Acute respiratory failure with hypoxemia ([place]) 11/03/2015  . Tracheostomy in place [facility]), chronic since 2002 11/03/2015  . Hypokalemia 11/03/2015  . Lupus anticoagulant disorder ([place])   . Chronic diastolic CHF (congestive heart failure) ([place])   . Respiratory failure ([place])   . Coronary artery disease involving coronary bypass graft of native heart without angina pectoris   . OSA (obstructive sleep apnea)   . COPD exacerbation ([place])   . ST elevation myocardial infarction (STEMI) of inferior wall ([place]) 10/29/2015  . Acute ST elevation myocardial infarction (STEMI) involving right coronary artery ([place])   . Tobacco abuse   . DM (diabetes mellitus) ([place]) 06/11/2009  . Hyperlipidemia LDL goal <70 06/11/2009  . DVT 06/11/2009  . CORONARY ARTERY BYPASS GRAFT, HX OF 06/11/2009  . Essential hypertension 05/27/2009  . CAD, NATIVE VESSEL 05/27/2009    Past Surgical History:  Procedure Laterality Date  . CARDIAC CATHETERIZATION N/A 10/29/2015   Procedure: Left Heart Cath and Cors/Grafts Angiography;  Surgeon: Burnell Blanks, MD;  Location: [place] [facility];  Service: Cardiovascular;  Laterality: N/A;  . CARDIAC CATHETERIZATION  10/29/2015   Procedure: Coronary Stent Intervention;  Surgeon: Burnell Blanks, MD;  Location: [place] [facility];  Service: Cardiovascular;;  .  CAROTID STENT    . CESAREAN SECTION WITH BILATERAL TUBAL LIGATION    . CORONARY ARTERY BYPASS GRAFT  2007   2V  . TRACHEOSTOMY      OB History    No data available       Home Medications    Prior to Admission medications   Medication Sig Start Date End Date Taking? Authorizing Provider  acetaminophen (TYLENOL) 325 MG tablet Take 2 tablets (650 mg total) by mouth every 4 (four) hours as needed for headache or mild pain. 11/03/15   Isaiah Serge, NP  albuterol (PROVENTIL) (2.5 MG/3ML) 0.083% nebulizer solution Take 3 mLs (2.5  mg total) by nebulization every 4 (four) hours as needed for wheezing or shortness of breath. 11/17/15   Donita Brooks, NP  clobetasol ointment (TEMOVATE) 6.56 % Apply 1 application topically as needed.    [provider]  clopidogrel (PLAVIX) 75 MG tablet TAKE 1 TABLET BY MOUTH ONCE DAILY **NEED  OFFICE  VISIT** 04/06/17   [name], MD  furosemide (LASIX) 20 MG tablet Take 40 mg by mouth every morning. & extra as needed    [provider]  gabapentin (NEURONTIN) 300 MG capsule Take 300-600 mg by mouth 2 (two) times daily. 12/31/15   [provider]  Insulin Glargine (TOUJEO SOLOSTAR) 300 UNIT/ML SOPN Inject 25 Units into the skin every evening.    [provider]  lisinopril (PRINIVIL,ZESTRIL) 20 MG tablet TAKE ONE TABLET BY MOUTH ONCE DAILY 05/10/17   [name], MD  metFORMIN (GLUCOPHAGE) 500 MG tablet Take 500 mg by mouth 2 (two) times daily with a meal.    [provider]  metoprolol (LOPRESSOR) 50 MG tablet Take 50 mg by mouth 2 (two) times daily.    [provider]  nitroGLYCERIN (NITROSTAT) 0.4 MG SL tablet Place 0.4 mg under the tongue every 5 (five) minutes as needed for chest pain.    [provider]  pantoprazole (PROTONIX) 40 MG tablet Take 40 mg by mouth daily.    [provider]  simethicone (GAS-X) 80 MG chewable tablet Chew 80 mg by mouth every 6 (six) hours as needed for flatulence.    [provider]  Tracheostomy Care KIT 1 kit by Does not apply route daily. 12/03/15   Erick Colace, NP  traMADol (ULTRAM) 50 MG tablet Take 1 tablet by mouth 2 (two) times daily. 10/08/16   [provider]  traZODone (DESYREL) 50 MG tablet Take 50 mg by mouth at bedtime.    [provider]  warfarin (COUMADIN) 5 MG tablet Take 2.5-5 mg by mouth See admin instructions. Pt takes 2.30m on Sundays - pt takes 575mall other days, MANAGED BY PMD    [provider]    Family History Family  History  Problem Relation Age of Onset  . Hypertension Mother     Social History Social History   Tobacco Use  . Smoking status: Current Every Day Smoker    Packs/day: 0.75    Types: Cigarettes    Start date: 10/23/1978  . Smokeless tobacco: Never Used  Substance Use Topics  . Alcohol use: No    Alcohol/week: 0.0 oz  . Drug use: No     Allergies   Penicillins   Review of Systems Review of Systems ROS: Statement: All systems negative except as marked or noted in the HPI; Constitutional: Negative for fever and chills. ; ; Eyes: Negative for eye pain, redness and discharge. ; ;  ENMT: Negative for ear pain, hoarseness, nasal congestion, sinus pressure and sore throat. ; ; Cardiovascular: Negative for chest pain, palpitations, diaphoresis, and peripheral edema. ; ; Respiratory: +cough, SOB. Negative for wheezing and stridor. ; ; Gastrointestinal: +diarrhea, abd pain. Negative for nausea, vomiting, blood in stool, hematemesis, jaundice and rectal bleeding. . ; ; Genitourinary: Negative for dysuria, flank pain and hematuria. ; ; Musculoskeletal: Negative for back pain and neck pain. Negative for swelling and trauma.; ; Skin: Negative for pruritus, rash, abrasions, blisters, bruising and skin lesion.; ; Neuro: Negative for headache, lightheadedness and neck stiffness. Negative for weakness, altered level of consciousness, altered mental status, extremity weakness, paresthesias, involuntary movement, seizure and syncope.      Physical Exam Updated Vital Signs BP 138/79 (BP Location: Right Arm)   Pulse 77   Temp 98.5 F (36.9 C) (Oral)   Resp (!) 22   Ht 4' 9" (1.448 m)   Wt 104.3 kg (230 lb)   SpO2 (S) (!) 81%   BMI 49.77 kg/m    Patient Vitals for the past 24 hrs:  BP Temp Temp src Pulse Resp SpO2 Height Weight  07/31/17 1730 (!) 159/92 - - 95 (!) 38 (!) 86 % - -  07/31/17 1630 (!) 150/95 - - 92 (!) 26 91 % - -  07/31/17 1600 (!) 148/82 - - 93 (!) 24 92 % - -  07/31/17 1530  (!) 153/99 - - 90 (!) 24 93 % - -  07/31/17 1500 (!) 132/111 - - 79 (!) 23 92 % - -  07/31/17 1454 - - - - - 97 % - -  07/31/17 1411 - - - - - - 4' 9" (1.448 m) 104.3 kg (230 lb)  07/31/17 1410 138/79 98.5 F (36.9 C) Oral 77 (!) 22 (!) 81 % - -     Physical Exam 1425: Physical examination:  Nursing notes reviewed; Vital signs and O2 SAT reviewed;  Constitutional: Well developed, Well nourished, Well hydrated, In no acute distress; Head:  Normocephalic, atraumatic; Eyes: EOMI, PERRL, No scleral icterus; ENMT: Mouth and pharynx normal, Mucous membranes moist; Neck: +trach present without drainage. Supple, Full range of motion, No lymphadenopathy; Cardiovascular: Regular rate and rhythm, No gallop; Respiratory: Breath sounds diminished & equal bilaterally, scattered wheezes. No audible wheezing. Speaking full sentences, Sitting upright, mild tachypnea. Normal respiratory effort/excursion; Chest: Nontender, Movement normal; Abdomen: Soft, +RUQ and RLQ tenderness to palp. Nondistended, Normal bowel sounds; Genitourinary: No CVA tenderness; Extremities: Pulses normal, No tenderness, +2 pedal edema bilat. No calf asymmetry.; Neuro: AA&Ox3, Major CN grossly intact.  Speech clear. No gross focal motor or sensory deficits in extremities.; Skin: Color normal, Warm, Dry.    ED Treatments / Results  Labs (all labs ordered are listed, but only abnormal results are displayed)   EKG  EKG Interpretation  Date/Time:  Sunday July 31 2017 14:26:38 EST Ventricular Rate:  78 PR Interval:    QRS Duration: 95 QT Interval:  420 QTC Calculation: 479 R Axis:   35 Text Interpretation:  Sinus rhythm Low voltage, extremity and precordial leads Abnormal T, consider ischemia, diffuse leads Baseline wander When compared with ECG of 01/23/2016 No significant change was found Confirmed by Francine Graven [phone]) on 07/31/2017 2:36:49 PM       Radiology   Procedures Procedures (including critical care  time)  Medications Ordered in ED Medications  albuterol (PROVENTIL,VENTOLIN) solution continuous neb (not administered)  ipratropium (ATROVENT) nebulizer solution 1 mg (not administered)  Initial Impression / Assessment and Plan / ED Course  I have reviewed the triage vital signs and the nursing notes.  Pertinent labs & imaging results that were available during my care of the patient were reviewed by me and considered in my medical decision making (see chart for details).  MDM Reviewed: previous chart, nursing note and vitals Reviewed previous: labs and ECG Interpretation: labs, ECG, x-ray and CT scan Total time providing critical care: 30-74 minutes. This excludes time spent performing separately reportable procedures and services. Consults: admitting MD    CRITICAL CARE Performed by: Alfonzo Feller Total critical care time: 35 minutes Critical care time was exclusive of separately billable procedures and treating other patients. Critical care was necessary to treat or prevent imminent or life-threatening deterioration. Critical care was time spent personally by me on the following activities: development of treatment plan with patient and/or surrogate as well as nursing, discussions with consultants, evaluation of patient's response to treatment, examination of patient, obtaining history from patient or surrogate, ordering and performing treatments and interventions, ordering and review of laboratory studies, ordering and review of radiographic studies, pulse oximetry and re-evaluation of patient's condition.   Results for orders placed or performed during the hospital encounter of 07/31/17  Comprehensive metabolic panel  Result Value Ref Range   Sodium 139 135 - 145 mmol/L   Potassium 3.9 3.5 - 5.1 mmol/L   Chloride 99 (L) 101 - 111 mmol/L   CO2 31 22 - 32 mmol/L   Glucose, Bld 239 (H) 65 - 99 mg/dL   BUN 11 6 - 20 mg/dL   Creatinine, Ser 0.93 0.44 - 1.00 mg/dL    Calcium 9.2 8.9 - 10.3 mg/dL   Total Protein 7.1 6.5 - 8.1 g/dL   Albumin 3.2 (L) 3.5 - 5.0 g/dL   AST 17 15 - 41 U/L   ALT 12 (L) 14 - 54 U/L   Alkaline Phosphatase 100 38 - 126 U/L   Total Bilirubin 1.7 (H) 0.3 - 1.2 mg/dL   GFR calc non Af Amer >60 >60 mL/min   GFR calc Af Amer >60 >60 mL/min   Anion gap 9 5 - 15  Lipase, blood  Result Value Ref Range   Lipase 22 11 - 51 U/L  Brain natriuretic peptide  Result Value Ref Range   B Natriuretic Peptide 1,089.0 (H) 0.0 - 100.0 pg/mL  CBC with Differential  Result Value Ref Range   WBC 6.9 4.0 - 10.5 K/uL   RBC 4.49 3.87 - 5.11 MIL/uL   Hemoglobin 13.5 12.0 - 15.0 g/dL   HCT 44.0 36.0 - 46.0 %   MCV 98.0 78.0 - 100.0 fL   MCH 30.1 26.0 - 34.0 pg   MCHC 30.7 30.0 - 36.0 g/dL   RDW 16.6 (H) 11.5 - 15.5 %   Platelets 194 150 - 400 K/uL   Neutrophils Relative % 78 %   Neutro Abs 5.3 1.7 - 7.7 K/uL   Lymphocytes Relative 15 %   Lymphs Abs 1.0 0.7 - 4.0 K/uL   Monocytes Relative 7 %   Monocytes Absolute 0.5 0.1 - 1.0 K/uL   Eosinophils Relative 0 %   Eosinophils Absolute 0.0 0.0 - 0.7 K/uL   Basophils Relative 0 %   Basophils Absolute 0.0 0.0 - 0.1 K/uL  Troponin I  Result Value Ref Range   Troponin I <0.03 <0.03 ng/mL  Urinalysis, Routine w reflex microscopic  Result Value Ref Range   Color, Urine AMBER (A) YELLOW  APPearance HAZY (A) CLEAR   Specific Gravity, Urine >1.046 (H) 1.005 - 1.030   pH 5.0 5.0 - 8.0   Glucose, UA 50 (A) NEGATIVE mg/dL   Hgb urine dipstick SMALL (A) NEGATIVE   Bilirubin Urine NEGATIVE NEGATIVE   Ketones, ur NEGATIVE NEGATIVE mg/dL   Protein, ur >=300 (A) NEGATIVE mg/dL   Nitrite NEGATIVE NEGATIVE   Leukocytes, UA NEGATIVE NEGATIVE   RBC / HPF 0-5 0 - 5 RBC/hpf   WBC, UA 0-5 0 - 5 WBC/hpf   Bacteria, UA NONE SEEN NONE SEEN   Squamous Epithelial / LPF 6-30 (A) NONE SEEN   Mucus PRESENT   Protime-INR  Result Value Ref Range   Prothrombin Time 17.9 (H) 11.4 - 15.2 seconds   INR 1.49    I-Stat CG4 Lactic Acid, ED  Result Value Ref Range   Lactic Acid, Venous 2.53 (HH) 0.5 - 1.9 mmol/L   Comment NOTIFIED PHYSICIAN   I-Stat CG4 Lactic Acid, ED  Result Value Ref Range   Lactic Acid, Venous 2.90 (HH) 0.5 - 1.9 mmol/L   Ct Angio Chest Pe W/cm &/or Wo Cm Result Date: 07/31/2017 CLINICAL DATA:  Dyspnea, cough and congestion with right upper quadrant pain since yesterday. EXAM: CT ANGIOGRAPHY CHEST CT ABDOMEN AND PELVIS WITH CONTRAST TECHNIQUE: Multidetector CT imaging of the chest was performed using the standard protocol during bolus administration of intravenous contrast. Multiplanar CT image reconstructions and MIPs were obtained to evaluate the vascular anatomy. Multidetector CT imaging of the abdomen and pelvis was performed using the standard protocol during bolus administration of intravenous contrast. CONTRAST:  100 cc Isovue 370 IV COMPARISON:  CXR 10/27/2016 and 07/31/2017, chest CT 01/23/2016 FINDINGS: CTA CHEST FINDINGS Cardiovascular: There is cardiomegaly without pericardial effusion. No acute pulmonary embolus. Three-vessel coronary arteriosclerosis is noted with post CABG change. Normal caliber to aorta with minimal aortic atherosclerosis. No dissection. Mediastinum/Nodes: Right upper paratracheal 2.5 cm short axis and prevascular 1.5 cm short axis lymphadenopathy with smaller prevascular and paratracheal lymph nodes present. Subcarinal lymphadenopathy the 2.1 cm short axis. Mild hilar lymphadenopathy is also noted more so on the right. Tracheostomy tube is noted with tip in the upper trachea. The mainstem bronchi are patent. No esophageal abnormality. Lungs/Pleura: Interlobular septal thickening with diffuse bilateral upper lobe predominant ground-glass opacities consistent stigmata of CHF. Streaky atelectasis and/or scarring is seen predominantly in the lower lobes. Small amount of fluid in the right minor and major fissures. Peribronchial thickening is also noted to the  lower lobes, right middle lobe and lingula consistent with chronic bronchitic change. Musculoskeletal: Degenerative changes are seen along the dorsal spine. No acute nor suspicious osseous lesions. Review of the MIP images confirms the above findings. CT ABDOMEN and PELVIS FINDINGS Hepatobiliary: Hepatic steatosis without focal mass or biliary dilatation. 8 mm gallstone near the neck of the gallbladder without secondary signs of acute cholecystitis. Pancreas: Normal Spleen: Normal size spleen without mass. Adrenals/Urinary Tract: Lobular, low-density left adrenal mass measuring 3.7 x 2.4 cm on series 12 image 21, is stable in appearance. Based on Hounsfield units, findings would be in keeping with an adenoma. No right adrenal mass. No obstructive uropathy. Mild right lower pole cortical irregularity suspicious for scarring. No obstructive uropathy. Nondistended urinary bladder. Stomach/Bowel: Physiologic distention of the stomach. Normal small bowel rotation without inflammation or obstruction. Mild submucosal fatty change of the cecum and ascending colon that may reflect stigmata of chronic inflammatory bowel. Normal-appearing appendix. Scattered diverticulosis along the distal descending and sigmoid colon without  acute diverticulitis. Vascular/Lymphatic: Aortoiliac and branch vessel atherosclerosis without aneurysm. No lymphadenopathy by CT size criteria. Reproductive: Uterus and bilateral adnexa are unremarkable. Other: Soft tissue anasarca.  No ascites or free air. Musculoskeletal: No aggressive lytic or blastic disease. Mild degenerative change along the lower thoracic spine. No acute nor suspicious osseous abnormality Review of the MIP images confirms the above findings. IMPRESSION: 1. The patient is status post CABG. There is stable cardiomegaly with coronary arteriosclerosis re- demonstrated. 2. Nonspecific mediastinal and right hilar lymphadenopathy slightly increased in prominence along the aortic arch and  subcarinal portion since prior question chronic reactive adenopathy. 3. No acute pulmonary embolus. 4. Stigmata of CHF with interlobular septal thickening and ground-glass opacities predominantly in the upper lobes. Chronic bronchitic change of the lungs with peribronchial thickening re-demonstrated and diffuse scattered subsegmental areas of atelectasis at the lung bases. 5. Hepatic steatosis. 6. Uncomplicated cholelithiasis. 7. Stable lobular hypodense left adrenal mass consistent with an adenoma measuring 3.7 x 2.4 cm currently. 8. Colonic diverticulosis.  No acute diverticulitis. 9. Mild soft tissue anasarca. Electronically Signed   By: Ashley Royalty M.D.   On: 07/31/2017 17:59   Ct Abdomen Pelvis W Contrast Result Date: 07/31/2017 CLINICAL DATA:  Dyspnea, cough and congestion with right upper quadrant pain since yesterday. EXAM: CT ANGIOGRAPHY CHEST CT ABDOMEN AND PELVIS WITH CONTRAST TECHNIQUE: Multidetector CT imaging of the chest was performed using the standard protocol during bolus administration of intravenous contrast. Multiplanar CT image reconstructions and MIPs were obtained to evaluate the vascular anatomy. Multidetector CT imaging of the abdomen and pelvis was performed using the standard protocol during bolus administration of intravenous contrast. CONTRAST:  100 cc Isovue 370 IV COMPARISON:  CXR 10/27/2016 and 07/31/2017, chest CT 01/23/2016 FINDINGS: CTA CHEST FINDINGS Cardiovascular: There is cardiomegaly without pericardial effusion. No acute pulmonary embolus. Three-vessel coronary arteriosclerosis is noted with post CABG change. Normal caliber to aorta with minimal aortic atherosclerosis. No dissection. Mediastinum/Nodes: Right upper paratracheal 2.5 cm short axis and prevascular 1.5 cm short axis lymphadenopathy with smaller prevascular and paratracheal lymph nodes present. Subcarinal lymphadenopathy the 2.1 cm short axis. Mild hilar lymphadenopathy is also noted more so on the right.  Tracheostomy tube is noted with tip in the upper trachea. The mainstem bronchi are patent. No esophageal abnormality. Lungs/Pleura: Interlobular septal thickening with diffuse bilateral upper lobe predominant ground-glass opacities consistent stigmata of CHF. Streaky atelectasis and/or scarring is seen predominantly in the lower lobes. Small amount of fluid in the right minor and major fissures. Peribronchial thickening is also noted to the lower lobes, right middle lobe and lingula consistent with chronic bronchitic change. Musculoskeletal: Degenerative changes are seen along the dorsal spine. No acute nor suspicious osseous lesions. Review of the MIP images confirms the above findings. CT ABDOMEN and PELVIS FINDINGS Hepatobiliary: Hepatic steatosis without focal mass or biliary dilatation. 8 mm gallstone near the neck of the gallbladder without secondary signs of acute cholecystitis. Pancreas: Normal Spleen: Normal size spleen without mass. Adrenals/Urinary Tract: Lobular, low-density left adrenal mass measuring 3.7 x 2.4 cm on series 12 image 21, is stable in appearance. Based on Hounsfield units, findings would be in keeping with an adenoma. No right adrenal mass. No obstructive uropathy. Mild right lower pole cortical irregularity suspicious for scarring. No obstructive uropathy. Nondistended urinary bladder. Stomach/Bowel: Physiologic distention of the stomach. Normal small bowel rotation without inflammation or obstruction. Mild submucosal fatty change of the cecum and ascending colon that may reflect stigmata of chronic inflammatory bowel. Normal-appearing appendix. Scattered  diverticulosis along the distal descending and sigmoid colon without acute diverticulitis. Vascular/Lymphatic: Aortoiliac and branch vessel atherosclerosis without aneurysm. No lymphadenopathy by CT size criteria. Reproductive: Uterus and bilateral adnexa are unremarkable. Other: Soft tissue anasarca.  No ascites or free air.  Musculoskeletal: No aggressive lytic or blastic disease. Mild degenerative change along the lower thoracic spine. No acute nor suspicious osseous abnormality Review of the MIP images confirms the above findings. IMPRESSION: 1. The patient is status post CABG. There is stable cardiomegaly with coronary arteriosclerosis re- demonstrated. 2. Nonspecific mediastinal and right hilar lymphadenopathy slightly increased in prominence along the aortic arch and subcarinal portion since prior question chronic reactive adenopathy. 3. No acute pulmonary embolus. 4. Stigmata of CHF with interlobular septal thickening and ground-glass opacities predominantly in the upper lobes. Chronic bronchitic change of the lungs with peribronchial thickening re-demonstrated and diffuse scattered subsegmental areas of atelectasis at the lung bases. 5. Hepatic steatosis. 6. Uncomplicated cholelithiasis. 7. Stable lobular hypodense left adrenal mass consistent with an adenoma measuring 3.7 x 2.4 cm currently. 8. Colonic diverticulosis.  No acute diverticulitis. 9. Mild soft tissue anasarca. Electronically Signed   By: Ashley Royalty M.D.   On: 07/31/2017 17:59   Dg Chest Port 1 View Result Date: 07/31/2017 CLINICAL DATA:  Shortness of breath, cough. EXAM: PORTABLE CHEST 1 VIEW COMPARISON:  Radiograph of October 27, 2016. FINDINGS: Stable cardiomegaly and central pulmonary vascular congestion. Tracheostomy is in grossly good position. Sternotomy wires are noted. Probable bilateral pulmonary edema is noted. Fluid is noted in the right minor fissure. Bony thorax is unremarkable. Minimal bilateral pleural effusions are noted. IMPRESSION: Stable cardiomegaly and central pulmonary vascular congestion. Mild bilateral pulmonary edema is noted with minimal pleural effusions. Electronically Signed   By: Marijo Conception, M.D.   On: 07/31/2017 15:18     1820:   On arrival:  Pt hypoxic, wheezing; hour long neb and IV solumedrol given. After neb, pt now  with coarse lungs, Sats were improved to 92% but now 86%. Will change to trach collar, dose IV lasix and admit. The patient is noted to have an elevated lactate. With the current information available to me, I don't think the patient is in septic shock. The lactate>4, is related to respiratory distress/ respiratory failure/hypoxia.  Dx and testing d/w pt and family.  Questions answered.  Verb understanding, agreeable to admit.  T/C to [facility] Dr. Erlene Quan, case discussed, including:  HPI, pertinent PM/SHx, VS/PE, dx testing, ED course and treatment:  Agreeable to admit.        Final Clinical Impressions(s) / ED Diagnoses   Final diagnoses:  None    ED Discharge Orders    None       Francine Graven, DO 08/03/17 1904

## 2017-08-01 ENCOUNTER — Inpatient Hospital Stay (HOSPITAL_COMMUNITY): Payer: Medicare Other

## 2017-08-01 DIAGNOSIS — Z794 Long term (current) use of insulin: Secondary | ICD-10-CM

## 2017-08-01 DIAGNOSIS — E1165 Type 2 diabetes mellitus with hyperglycemia: Secondary | ICD-10-CM

## 2017-08-01 DIAGNOSIS — J9621 Acute and chronic respiratory failure with hypoxia: Secondary | ICD-10-CM

## 2017-08-01 DIAGNOSIS — I5033 Acute on chronic diastolic (congestive) heart failure: Secondary | ICD-10-CM

## 2017-08-01 LAB — PROTIME-INR
INR: 1.74
Prothrombin Time: 20.2 seconds — ABNORMAL HIGH (ref 11.4–15.2)

## 2017-08-01 LAB — GLUCOSE, CAPILLARY
GLUCOSE-CAPILLARY: 217 mg/dL — AB (ref 65–99)
GLUCOSE-CAPILLARY: 298 mg/dL — AB (ref 65–99)
Glucose-Capillary: 433 mg/dL — ABNORMAL HIGH (ref 65–99)
Glucose-Capillary: 297 mg/dL — ABNORMAL HIGH (ref 65–99)

## 2017-08-01 LAB — COMPREHENSIVE METABOLIC PANEL
ALK PHOS: 98 U/L (ref 38–126)
ALT: 14 U/L (ref 14–54)
ANION GAP: 11 (ref 5–15)
AST: 19 U/L (ref 15–41)
Albumin: 3.2 g/dL — ABNORMAL LOW (ref 3.5–5.0)
BUN: 14 mg/dL (ref 6–20)
CALCIUM: 8.6 mg/dL — AB (ref 8.9–10.3)
CO2: 28 mmol/L (ref 22–32)
CREATININE: 0.92 mg/dL (ref 0.44–1.00)
Chloride: 94 mmol/L — ABNORMAL LOW (ref 101–111)
Glucose, Bld: 436 mg/dL — ABNORMAL HIGH (ref 65–99)
Potassium: 4.1 mmol/L (ref 3.5–5.1)
SODIUM: 133 mmol/L — AB (ref 135–145)
TOTAL PROTEIN: 7.2 g/dL (ref 6.5–8.1)
Total Bilirubin: 1.4 mg/dL — ABNORMAL HIGH (ref 0.3–1.2)

## 2017-08-01 LAB — CBC WITH DIFFERENTIAL/PLATELET
Basophils Absolute: 0 10*3/uL (ref 0.0–0.1)
Basophils Relative: 0 %
EOS ABS: 0 10*3/uL (ref 0.0–0.7)
EOS PCT: 0 %
HCT: 43.8 % (ref 36.0–46.0)
HEMOGLOBIN: 13.3 g/dL (ref 12.0–15.0)
LYMPHS ABS: 0.6 10*3/uL — AB (ref 0.7–4.0)
LYMPHS PCT: 8 %
MCH: 29.8 pg (ref 26.0–34.0)
MCHC: 30.4 g/dL (ref 30.0–36.0)
MCV: 98.2 fL (ref 78.0–100.0)
MONOS PCT: 2 %
Monocytes Absolute: 0.1 10*3/uL (ref 0.1–1.0)
Neutro Abs: 6.3 10*3/uL (ref 1.7–7.7)
Neutrophils Relative %: 90 %
PLATELETS: 198 10*3/uL (ref 150–400)
RBC: 4.46 MIL/uL (ref 3.87–5.11)
RDW: 16.7 % — ABNORMAL HIGH (ref 11.5–15.5)
WBC: 7 10*3/uL (ref 4.0–10.5)

## 2017-08-01 LAB — BILIRUBIN, FRACTIONATED(TOT/DIR/INDIR)
BILIRUBIN DIRECT: 0.3 mg/dL (ref 0.1–0.5)
BILIRUBIN INDIRECT: 1 mg/dL — AB (ref 0.3–0.9)
BILIRUBIN TOTAL: 1.3 mg/dL — AB (ref 0.3–1.2)

## 2017-08-01 LAB — TSH: TSH: 0.651 u[IU]/mL (ref 0.350–4.500)

## 2017-08-01 LAB — MAGNESIUM: Magnesium: 1.6 mg/dL — ABNORMAL LOW (ref 1.7–2.4)

## 2017-08-01 MED ORDER — INSULIN ASPART 100 UNIT/ML ~~LOC~~ SOLN
0.0000 [IU] | Freq: Three times a day (TID) | SUBCUTANEOUS | Status: DC
  Administered 2017-08-01: 11 [IU] via SUBCUTANEOUS
  Administered 2017-08-01: 7 [IU] via SUBCUTANEOUS
  Administered 2017-08-02: 15 [IU] via SUBCUTANEOUS
  Administered 2017-08-02: 11 [IU] via SUBCUTANEOUS
  Administered 2017-08-02: 20 [IU] via SUBCUTANEOUS
  Administered 2017-08-03: 11 [IU] via SUBCUTANEOUS
  Administered 2017-08-03: 4 [IU] via SUBCUTANEOUS
  Administered 2017-08-03: 7 [IU] via SUBCUTANEOUS
  Administered 2017-08-04: 15 [IU] via SUBCUTANEOUS
  Administered 2017-08-04: 11 [IU] via SUBCUTANEOUS

## 2017-08-01 MED ORDER — INFLUENZA VAC SPLIT QUAD 0.5 ML IM SUSY
0.5000 mL | PREFILLED_SYRINGE | INTRAMUSCULAR | Status: AC
  Administered 2017-08-02: 0.5 mL via INTRAMUSCULAR
  Filled 2017-08-01: qty 0.5

## 2017-08-01 MED ORDER — INSULIN ASPART 100 UNIT/ML ~~LOC~~ SOLN
0.0000 [IU] | Freq: Every day | SUBCUTANEOUS | Status: DC
  Administered 2017-08-01: 3 [IU] via SUBCUTANEOUS
  Administered 2017-08-03: 4 [IU] via SUBCUTANEOUS

## 2017-08-01 MED ORDER — INSULIN GLARGINE 100 UNIT/ML ~~LOC~~ SOLN
20.0000 [IU] | Freq: Every day | SUBCUTANEOUS | Status: DC
  Administered 2017-08-01 – 2017-08-02 (×2): 20 [IU] via SUBCUTANEOUS
  Filled 2017-08-01 (×3): qty 0.2

## 2017-08-01 MED ORDER — CHLORHEXIDINE GLUCONATE 0.12 % MT SOLN
15.0000 mL | Freq: Two times a day (BID) | OROMUCOSAL | Status: DC
  Administered 2017-08-01 – 2017-08-03 (×6): 15 mL via OROMUCOSAL
  Filled 2017-08-01 (×7): qty 15

## 2017-08-01 MED ORDER — WARFARIN SODIUM 5 MG PO TABS
5.0000 mg | ORAL_TABLET | Freq: Once | ORAL | Status: AC
  Administered 2017-08-01: 5 mg via ORAL
  Filled 2017-08-01: qty 1

## 2017-08-01 MED ORDER — ORAL CARE MOUTH RINSE
15.0000 mL | Freq: Two times a day (BID) | OROMUCOSAL | Status: DC
  Administered 2017-08-01 – 2017-08-03 (×3): 15 mL via OROMUCOSAL

## 2017-08-01 MED ORDER — INSULIN GLARGINE 100 UNIT/ML ~~LOC~~ SOLN
15.0000 [IU] | Freq: Every day | SUBCUTANEOUS | Status: DC
  Filled 2017-08-01: qty 0.15

## 2017-08-01 MED ORDER — INSULIN ASPART 100 UNIT/ML ~~LOC~~ SOLN
25.0000 [IU] | Freq: Once | SUBCUTANEOUS | Status: AC
  Administered 2017-08-01: 25 [IU] via SUBCUTANEOUS

## 2017-08-01 MED ORDER — METHYLPREDNISOLONE SODIUM SUCC 125 MG IJ SOLR
60.0000 mg | Freq: Three times a day (TID) | INTRAMUSCULAR | Status: DC
  Administered 2017-08-01 – 2017-08-02 (×5): 60 mg via INTRAVENOUS
  Filled 2017-08-01 (×5): qty 2

## 2017-08-01 NOTE — Progress Notes (Signed)
Dr. Carles Collet paged and made aware of CBG 433. Currently awaiting CMP results drawn around 0630.

## 2017-08-01 NOTE — Progress Notes (Signed)
Theresa [name] for Warfarin (home med) Indication: lupus anticoagulant d/o  Allergies  Allergen Reactions  . Penicillins Rash    Has patient had a PCN reaction causing immediate rash, facial/tongue/throat swelling, SOB or lightheadedness with hypotension: Yes Has patient had a PCN reaction causing severe rash involving mucus membranes or skin necrosis: No Has patient had a PCN reaction that required hospitalization No Has patient had a PCN reaction occurring within the last 10 years: No If all of the above answers are "NO", then may proceed with Cephalosporin use.   REACTION: rash    Patient Measurements: Height: 4' 9"  (144.8 cm) Weight: 216 lb 4.8 oz (98.1 kg) IBW/kg (Calculated) : 38.6  Vital Signs: Temp: 98.2 F (36.8 C) (11/26 0441) Temp Source: Oral (11/26 0441) BP: 144/85 (11/26 0441) Pulse Rate: 73 (11/26 0441)  Labs: Recent Labs    07/31/17 1427 08/01/17 0626  HGB 13.5 13.3  HCT 44.0 43.8  PLT 194 198  LABPROT 17.9* 20.2*  INR 1.49 1.74  CREATININE 0.93 0.92  TROPONINI <0.03  --    Estimated Creatinine Clearance: 71.3 mL/min (by C-G formula based on SCr of 0.92 mg/dL).  Medical History: Past Medical History:  Diagnosis Date  . CAD (coronary artery disease)    s/p 2V CABG in 2007  . CAD (coronary artery disease) of artery bypass graft 11/03/2015  . Cervical cancer ([place])   . CHF (congestive heart failure) ([place])   . Chronic RUQ pain   . COPD (chronic obstructive pulmonary disease) ([place])   . Diabetes mellitus ([place])   . Endometriosis   . History of gallstones 01/2016   seen on Ultrasound  . History of HIDA scan 11/2016   normal  . HTN (hypertension)   . Hyperlipidemia   . Lupus anticoagulant disorder ([facility])    on coumadin  . Respiratory failure ([place])    s/p tracheostomy 2002  . ST elevation (STEMI) myocardial infarction involving right coronary artery ([place]) 10/29/15   stent to VG to PDA  . Tracheostomy in place  [facility]), chronic since 2002 11/03/2015   Medications:  Medications Prior to Admission  Medication Sig Dispense Refill Last Dose  . albuterol (PROVENTIL HFA;VENTOLIN HFA) 108 (90 Base) MCG/ACT inhaler Inhale 1-2 puffs into the lungs every 6 (six) hours as needed for wheezing or shortness of breath.   07/31/2017 at Unknown time  . albuterol (PROVENTIL) (2.5 MG/3ML) 0.083% nebulizer solution Take 3 mLs (2.5 mg total) by nebulization every 4 (four) hours as needed for wheezing or shortness of breath. 75 mL 12 unknown  . clopidogrel (PLAVIX) 75 MG tablet TAKE 1 TABLET BY MOUTH ONCE DAILY **NEED  OFFICE  VISIT** (Patient taking differently: TAKE 1 TABLET BY MOUTH ONCE DAILY) 90 tablet 3 07/30/2017 at Unknown time  . furosemide (LASIX) 20 MG tablet Take 40 mg by mouth every morning. & extra as needed   07/30/2017 at Unknown time  . gabapentin (NEURONTIN) 300 MG capsule Take 300-600 mg by mouth 2 (two) times daily.   07/30/2017 at Unknown time  . insulin degludec (TRESIBA FLEXTOUCH) 100 UNIT/ML SOPN FlexTouch Pen Inject 25 Units into the skin daily at 10 pm.   07/30/2017 at Unknown time  . lisinopril (PRINIVIL,ZESTRIL) 20 MG tablet TAKE ONE TABLET BY MOUTH ONCE DAILY 90 tablet 1 07/30/2017 at Unknown time  . metoprolol (LOPRESSOR) 50 MG tablet Take 50 mg by mouth 2 (two) times daily.   07/30/2017 at 2130          .  nitroGLYCERIN (NITROSTAT) 0.4 MG SL tablet Place 0.4 mg under the tongue every 5 (five) minutes as needed for chest pain.   unknown  . pantoprazole (PROTONIX) 40 MG tablet Take 40 mg by mouth daily.   07/30/2017 at Unknown time  . traMADol (ULTRAM) 50 MG tablet Take 1 tablet by mouth 2 (two) times daily.   07/30/2017 at Unknown time  . warfarin (COUMADIN) 5 MG tablet Take 2.5-5 mg by mouth See admin instructions. Pt takes 2.28m on Sundays - pt takes 521mall other days, MANAGED BY PMD   07/30/2017 at 2130  . Tracheostomy Care KIT 1 kit by Does not apply route daily. (Patient not taking: Reported on  07/31/2017) 30 each 6 Completed Course    Assessment: [age]o female on chronic coumadin.  INR subtherapeutic.  Goal of Therapy:  INR 2-3 Monitor platelets by anticoagulation protocol: Yes   Plan:  Coumadin 52m60moday INR daily  Roshawn Lacina Poteet 08/01/2017,8:43 AM

## 2017-08-01 NOTE — Progress Notes (Signed)
Nutrition Education Note  RD consulted for nutrition education regarding  CHF. The patient lives with husband.  RD provided "Low Sodium Nutrition Therapy" handout . Reviewed patient's dietary recall. Provided examples on ways to decrease sodium intake in diet.   Discouraged intake of processed foods and use of salt shaker. Ms Crandle denies using salt shaker at the table or when she cooks and already rinses canned vegetables in fresh water. She frequently cooks chicken and occasionally eats beef.   Encouraged fresh fruits and vegetables as well as whole grain sources of carbohydrates to maximize fiber intake. Her primary beverage is tea.   RD discussed why it is important for patient to adhere to diet recommendations, and emphasized the role of fluids, foods to avoid, and importance of weighing self daily. Teach back method used.  Expect good compliance.  Body mass index is 46.81 kg/m. Pt meets criteria for obesity class III based on current BMI.  Current diet order is Heart Healthy /CHO mod diet, patient is consuming approximately >75% of meals at this time.   Labs and medications reviewed. No further nutrition interventions warranted at this time. RD contact information provided.   Colman Cater MS,RD,CSG,LDN Office: [phone] Pager: [phone]

## 2017-08-01 NOTE — Progress Notes (Signed)
Pt performed self trach care at sink.

## 2017-08-01 NOTE — Progress Notes (Signed)
Inpatient Diabetes Program Recommendations  AACE/ADA: New Consensus Statement on Inpatient Glycemic Control (2015)  Target Ranges:  Prepandial:   less than 140 mg/dL      Peak postprandial:   less than 180 mg/dL (1-2 hours)      Critically ill patients:  140 - 180 mg/dL   Results for Theresa [name], Theresa [name] (MRN [id number]) as of 08/01/2017 09:02  Ref. Range 07/31/2017 22:16 08/01/2017 07:34  Glucose-Capillary Latest Ref Range: 65 - 99 mg/dL 323 (H) 433 (H)  Results for Theresa [name], Theresa [name] (MRN [id number]) as of 08/01/2017 09:02  Ref. Range 07/31/2017 14:27 08/01/2017 06:26  Glucose Latest Ref Range: 65 - 99 mg/dL 239 (H) 436 (H)   Review of Glycemic Control  Diabetes history: DM2 Outpatient Diabetes medications: Tresiba 25 units QHS Current orders for Inpatient glycemic control: Lantus 15 units QHS, Novolog 0-20 units TID with meals, Novolog 0-5 units QHS; Solumedrol 60 mg Q8H  Inpatient Diabetes Program Recommendations: Insulin - Basal: Noted Lantus 15 units QHS ordered today to start at bedtime today. Please consider increasing Lantus to 25 units daily (to start now; based on 98 kg x 0.25 units).   Thanks, Barnie Alderman, RN, MSN, CDE Diabetes Coordinator Inpatient Diabetes Program [phone] (Team Pager from 8am to 5pm)

## 2017-08-01 NOTE — Progress Notes (Signed)
PROGRESS NOTE  Theresa [name] JFH:[id number] DOB: Jan 29, 1966 DOA: 07/31/2017 PCP: Monico Blitz, MD  Brief History:  51 year old female with a history of diastolic CHF, COPD, continued tobacco use, coronary artery disease status post CABG 2007, diabetes mellitus, chronic respiratory failure on 3-4 L at home, and lupus anticoagulant on chronic anticoagulation presented with 2-week history of shortness of breath that has worsened over the past 2 days prior to admission.  She has had difficulty getting in and out of the shower because of shortness of breath.  The patient states that she ran out of all her prescription medications and did not take any medications for approximately 1 week because of financial difficulty.  She was finally able to get her prescriptions and restarted taking them on the day prior to Thanksgiving.  Nevertheless, the patient has complained of increasing lower extremity edema and orthopnea type symptoms over the past 2-3 days.  She has had to sleep on the couch propped up.  She denies any fevers, chills, nausea, vomiting, hemoptysis, headache, neck pain.  The patient has chronic abdominal pain and symptomatic cholelithiasis.  She was previously told by her primary care provider to seek surgical care in a tertiary care center, but has yet to do so.  Upon presentation, the patient was noted to be hypoxic with oxygen saturation 91%.  The patient was given furosemide IV and steroids.  Assessment/Plan: Acute on chronic respiratory failure with hypoxia -Secondary to decompensated CHF and COPD exacerbation -Wean back to baseline oxygen -Pulmonary hygiene -07/31/17--CTA chest--negative PE; diffuse bilateral upper lobe groundglass opacities with interlobular septal thickening; chronic bronchitic changes with peribronchial thickening in the bilateral LL and RML  Acute on chronic diastolic CHF -Secondary to dietary indiscretion and medication compliance -Continue furosemide IV  40 mg twice daily -Daily weights -Strict I's and O's -Remains clinically fluid overloaded -Echocardiogram -personally reviewed CXR--pulmonary edema  COPD exacerbation -Start Pulmicort -Start intravenous steroids -Continue duo nebs  Lupus anticoagulant -INR subtherapeutic at the time of admission -As discussed, patient was off warfarin for over a week before restarting the day prior to Thanksgiving -Lovenox bridge until INR >2  Coronary artery disease with history of STEMI -STEMI Feb 2017 with DES to SVG to PDA -continue Plavix -Continue metoprolol tartrate  Diabetes mellitus type 2, uncontrolled -Hemoglobin A1c -Continue reduced dose Lantus -NovoLog sliding scale -increase ISS to moderate scale -add pre-meal novolog  Symptomatic cholelithiasis -November 15, 2016 HIDA--negative -Will ultimately need a cholecystectomy at some point  Adrenal adenoma -Left adrenal adenoma--incidental finding on CT abdomen and pelvis -Outpatient surveillance  Essential hypertension -Continue metoprolol tartrateand lisinopril -Anticipate improvement with diuresis    Disposition Plan:   Home in 2-3 days  Family Communication:   No Family at bedside  Consultants:  none  Code Status:  FULL  DVT Prophylaxis:  [place] Lovenox bridge with warfarin   Procedures: As Listed in Progress Note Above  Antibiotics: None    Subjective: Patient states that she has breathing better but still remains short of breath with minimal exertion.  She denies any fevers, chills, chest pain, nausea, vomiting, diarrhea,Dysuria, hematuria. she has some right upper quadrant abdominal pain.    Objective: Vitals:   07/31/17 2210 07/31/17 2221 08/01/17 0441 08/01/17 0500  BP:   (!) 144/85   Pulse:   73   Resp:   20   Temp:   98.2 F (36.8 C)   TempSrc:   Oral   SpO2: 98%  98% 96%   Weight:    98.1 kg (216 lb 4.8 oz)  Height:        Intake/Output Summary (Last 24 hours) at 08/01/2017 0738 Last data filed  at 08/01/2017 0549 Gross per 24 hour  Intake 1274 ml  Output 1001 ml  Net 273 ml   Weight change:  Exam:   General:  Pt is alert, follows commands appropriately, not in acute distress  HEENT: No icterus, No thrush, No neck mass, [place]/AT  Cardiovascular: RRR, S1/S2, no rubs, no gallops  Respiratory: Bilateral crackles.  Bilateral wheezing.  Abdomen: Soft/+BS, RUQ and epigastric tender, non distended, no guarding  Extremities: 2 + LE edema, No lymphangitis, No petechiae, No rashes, no synovitis   Data Reviewed: I have personally reviewed following labs and imaging studies Basic Metabolic Panel: Recent Labs  Lab 07/31/17 1427  NA 139  K 3.9  CL 99*  CO2 31  GLUCOSE 239*  BUN 11  CREATININE 0.93  CALCIUM 9.2   Liver Function Tests: Recent Labs  Lab 07/31/17 1427  AST 17  ALT 12*  ALKPHOS 100  BILITOT 1.7*  PROT 7.1  ALBUMIN 3.2*   Recent Labs  Lab 07/31/17 1427  LIPASE 22   No results for input(s): AMMONIA in the last 168 hours. Coagulation Profile: Recent Labs  Lab 07/31/17 1427 08/01/17 0626  INR 1.49 1.74   CBC: Recent Labs  Lab 07/31/17 1427 08/01/17 0626  WBC 6.9 7.0  NEUTROABS 5.3 6.3  HGB 13.5 13.3  HCT 44.0 43.8  MCV 98.0 98.2  PLT 194 198   Cardiac Enzymes: Recent Labs  Lab 07/31/17 1427  TROPONINI <0.03   BNP: Invalid input(s): POCBNP CBG: Recent Labs  Lab 07/31/17 2216 08/01/17 0734  GLUCAP 323* 433*   HbA1C: No results for input(s): HGBA1C in the last 72 hours. Urine analysis:    Component Value Date/Time   COLORURINE AMBER (A) 07/31/2017 1810   APPEARANCEUR HAZY (A) 07/31/2017 1810   LABSPEC >1.046 (H) 07/31/2017 1810   PHURINE 5.0 07/31/2017 1810   GLUCOSEU 50 (A) 07/31/2017 1810   HGBUR SMALL (A) 07/31/2017 1810   BILIRUBINUR NEGATIVE 07/31/2017 1810   KETONESUR NEGATIVE 07/31/2017 1810   PROTEINUR >=300 (A) 07/31/2017 1810   NITRITE NEGATIVE 07/31/2017 1810   LEUKOCYTESUR NEGATIVE 07/31/2017 1810    Sepsis Labs: @LABRCNTIP (procalcitonin:4,lacticidven:4) )No results found for this or any previous visit (from the past 240 hour(s)).   Scheduled Meds: . aspirin EC  81 mg Oral Daily  . budesonide (PULMICORT) nebulizer solution  0.25 mg Nebulization BID  . chlorhexidine  15 mL Mouth Rinse BID  . clopidogrel  75 mg Oral Daily  . enoxaparin (LOVENOX) injection  100 mg Subcutaneous Q12H  . furosemide  40 mg Intravenous BID  . gabapentin  300-600 mg Oral BID  . [START ON 08/02/2017] Influenza vac split quadrivalent PF  0.5 mL Intramuscular Tomorrow-1000  . insulin aspart  0-9 Units Subcutaneous TID WC  . insulin glargine  5 Units Subcutaneous QHS  . ipratropium-albuterol  3 mL Nebulization Q6H  . lisinopril  20 mg Oral Daily  . mouth rinse  15 mL Mouth Rinse q12n4p  . metoprolol tartrate  50 mg Oral BID  . nicotine  21 mg Transdermal Daily  . pantoprazole  40 mg Oral Daily  . potassium chloride  10 mEq Oral BID  . sodium chloride flush  3 mL Intravenous Q12H  . traMADol  50 mg Oral BID  . Warfarin - Pharmacist Dosing  Inpatient   Does not apply Q24H   Continuous Infusions: . sodium chloride      Procedures/Studies: Ct Angio Chest Pe W/cm &/or Wo Cm  Result Date: 07/31/2017 CLINICAL DATA:  Dyspnea, cough and congestion with right upper quadrant pain since yesterday. EXAM: CT ANGIOGRAPHY CHEST CT ABDOMEN AND PELVIS WITH CONTRAST TECHNIQUE: Multidetector CT imaging of the chest was performed using the standard protocol during bolus administration of intravenous contrast. Multiplanar CT image reconstructions and MIPs were obtained to evaluate the vascular anatomy. Multidetector CT imaging of the abdomen and pelvis was performed using the standard protocol during bolus administration of intravenous contrast. CONTRAST:  100 cc Isovue 370 IV COMPARISON:  CXR 10/27/2016 and 07/31/2017, chest CT 01/23/2016 FINDINGS: CTA CHEST FINDINGS Cardiovascular: There is cardiomegaly without pericardial  effusion. No acute pulmonary embolus. Three-vessel coronary arteriosclerosis is noted with post CABG change. Normal caliber to aorta with minimal aortic atherosclerosis. No dissection. Mediastinum/Nodes: Right upper paratracheal 2.5 cm short axis and prevascular 1.5 cm short axis lymphadenopathy with smaller prevascular and paratracheal lymph nodes present. Subcarinal lymphadenopathy the 2.1 cm short axis. Mild hilar lymphadenopathy is also noted more so on the right. Tracheostomy tube is noted with tip in the upper trachea. The mainstem bronchi are patent. No esophageal abnormality. Lungs/Pleura: Interlobular septal thickening with diffuse bilateral upper lobe predominant ground-glass opacities consistent stigmata of CHF. Streaky atelectasis and/or scarring is seen predominantly in the lower lobes. Small amount of fluid in the right minor and major fissures. Peribronchial thickening is also noted to the lower lobes, right middle lobe and lingula consistent with chronic bronchitic change. Musculoskeletal: Degenerative changes are seen along the dorsal spine. No acute nor suspicious osseous lesions. Review of the MIP images confirms the above findings. CT ABDOMEN and PELVIS FINDINGS Hepatobiliary: Hepatic steatosis without focal mass or biliary dilatation. 8 mm gallstone near the neck of the gallbladder without secondary signs of acute cholecystitis. Pancreas: Normal Spleen: Normal size spleen without mass. Adrenals/Urinary Tract: Lobular, low-density left adrenal mass measuring 3.7 x 2.4 cm on series 12 image 21, is stable in appearance. Based on Hounsfield units, findings would be in keeping with an adenoma. No right adrenal mass. No obstructive uropathy. Mild right lower pole cortical irregularity suspicious for scarring. No obstructive uropathy. Nondistended urinary bladder. Stomach/Bowel: Physiologic distention of the stomach. Normal small bowel rotation without inflammation or obstruction. Mild submucosal fatty  change of the cecum and ascending colon that may reflect stigmata of chronic inflammatory bowel. Normal-appearing appendix. Scattered diverticulosis along the distal descending and sigmoid colon without acute diverticulitis. Vascular/Lymphatic: Aortoiliac and branch vessel atherosclerosis without aneurysm. No lymphadenopathy by CT size criteria. Reproductive: Uterus and bilateral adnexa are unremarkable. Other: Soft tissue anasarca.  No ascites or free air. Musculoskeletal: No aggressive lytic or blastic disease. Mild degenerative change along the lower thoracic spine. No acute nor suspicious osseous abnormality Review of the MIP images confirms the above findings. IMPRESSION: 1. The patient is status post CABG. There is stable cardiomegaly with coronary arteriosclerosis re- demonstrated. 2. Nonspecific mediastinal and right hilar lymphadenopathy slightly increased in prominence along the aortic arch and subcarinal portion since prior question chronic reactive adenopathy. 3. No acute pulmonary embolus. 4. Stigmata of CHF with interlobular septal thickening and ground-glass opacities predominantly in the upper lobes. Chronic bronchitic change of the lungs with peribronchial thickening re-demonstrated and diffuse scattered subsegmental areas of atelectasis at the lung bases. 5. Hepatic steatosis. 6. Uncomplicated cholelithiasis. 7. Stable lobular hypodense left adrenal mass consistent with an adenoma measuring  3.7 x 2.4 cm currently. 8. Colonic diverticulosis.  No acute diverticulitis. 9. Mild soft tissue anasarca. Electronically Signed   By: Ashley Royalty M.D.   On: 07/31/2017 17:59   Ct Abdomen Pelvis W Contrast  Result Date: 07/31/2017 CLINICAL DATA:  Dyspnea, cough and congestion with right upper quadrant pain since yesterday. EXAM: CT ANGIOGRAPHY CHEST CT ABDOMEN AND PELVIS WITH CONTRAST TECHNIQUE: Multidetector CT imaging of the chest was performed using the standard protocol during bolus administration of  intravenous contrast. Multiplanar CT image reconstructions and MIPs were obtained to evaluate the vascular anatomy. Multidetector CT imaging of the abdomen and pelvis was performed using the standard protocol during bolus administration of intravenous contrast. CONTRAST:  100 cc Isovue 370 IV COMPARISON:  CXR 10/27/2016 and 07/31/2017, chest CT 01/23/2016 FINDINGS: CTA CHEST FINDINGS Cardiovascular: There is cardiomegaly without pericardial effusion. No acute pulmonary embolus. Three-vessel coronary arteriosclerosis is noted with post CABG change. Normal caliber to aorta with minimal aortic atherosclerosis. No dissection. Mediastinum/Nodes: Right upper paratracheal 2.5 cm short axis and prevascular 1.5 cm short axis lymphadenopathy with smaller prevascular and paratracheal lymph nodes present. Subcarinal lymphadenopathy the 2.1 cm short axis. Mild hilar lymphadenopathy is also noted more so on the right. Tracheostomy tube is noted with tip in the upper trachea. The mainstem bronchi are patent. No esophageal abnormality. Lungs/Pleura: Interlobular septal thickening with diffuse bilateral upper lobe predominant ground-glass opacities consistent stigmata of CHF. Streaky atelectasis and/or scarring is seen predominantly in the lower lobes. Small amount of fluid in the right minor and major fissures. Peribronchial thickening is also noted to the lower lobes, right middle lobe and lingula consistent with chronic bronchitic change. Musculoskeletal: Degenerative changes are seen along the dorsal spine. No acute nor suspicious osseous lesions. Review of the MIP images confirms the above findings. CT ABDOMEN and PELVIS FINDINGS Hepatobiliary: Hepatic steatosis without focal mass or biliary dilatation. 8 mm gallstone near the neck of the gallbladder without secondary signs of acute cholecystitis. Pancreas: Normal Spleen: Normal size spleen without mass. Adrenals/Urinary Tract: Lobular, low-density left adrenal mass measuring  3.7 x 2.4 cm on series 12 image 21, is stable in appearance. Based on Hounsfield units, findings would be in keeping with an adenoma. No right adrenal mass. No obstructive uropathy. Mild right lower pole cortical irregularity suspicious for scarring. No obstructive uropathy. Nondistended urinary bladder. Stomach/Bowel: Physiologic distention of the stomach. Normal small bowel rotation without inflammation or obstruction. Mild submucosal fatty change of the cecum and ascending colon that may reflect stigmata of chronic inflammatory bowel. Normal-appearing appendix. Scattered diverticulosis along the distal descending and sigmoid colon without acute diverticulitis. Vascular/Lymphatic: Aortoiliac and branch vessel atherosclerosis without aneurysm. No lymphadenopathy by CT size criteria. Reproductive: Uterus and bilateral adnexa are unremarkable. Other: Soft tissue anasarca.  No ascites or free air. Musculoskeletal: No aggressive lytic or blastic disease. Mild degenerative change along the lower thoracic spine. No acute nor suspicious osseous abnormality Review of the MIP images confirms the above findings. IMPRESSION: 1. The patient is status post CABG. There is stable cardiomegaly with coronary arteriosclerosis re- demonstrated. 2. Nonspecific mediastinal and right hilar lymphadenopathy slightly increased in prominence along the aortic arch and subcarinal portion since prior question chronic reactive adenopathy. 3. No acute pulmonary embolus. 4. Stigmata of CHF with interlobular septal thickening and ground-glass opacities predominantly in the upper lobes. Chronic bronchitic change of the lungs with peribronchial thickening re-demonstrated and diffuse scattered subsegmental areas of atelectasis at the lung bases. 5. Hepatic steatosis. 6. Uncomplicated cholelithiasis. 7. Stable  lobular hypodense left adrenal mass consistent with an adenoma measuring 3.7 x 2.4 cm currently. 8. Colonic diverticulosis.  No acute  diverticulitis. 9. Mild soft tissue anasarca. Electronically Signed   By: Ashley Royalty M.D.   On: 07/31/2017 17:59   Dg Chest Port 1 View  Result Date: 07/31/2017 CLINICAL DATA:  Shortness of breath, cough. EXAM: PORTABLE CHEST 1 VIEW COMPARISON:  Radiograph of October 27, 2016. FINDINGS: Stable cardiomegaly and central pulmonary vascular congestion. Tracheostomy is in grossly good position. Sternotomy wires are noted. Probable bilateral pulmonary edema is noted. Fluid is noted in the right minor fissure. Bony thorax is unremarkable. Minimal bilateral pleural effusions are noted. IMPRESSION: Stable cardiomegaly and central pulmonary vascular congestion. Mild bilateral pulmonary edema is noted with minimal pleural effusions. Electronically Signed   By: Marijo Conception, M.D.   On: 07/31/2017 15:18    Denetra Formoso, DO  [facility] Pager [phone]  If 7PM-7AM, please contact night-coverage [email or web address] Password [email or web address] 08/01/2017, 7:38 AM   LOS: 1 day

## 2017-08-02 ENCOUNTER — Inpatient Hospital Stay (HOSPITAL_COMMUNITY): Payer: Medicare Other

## 2017-08-02 DIAGNOSIS — I340 Nonrheumatic mitral (valve) insufficiency: Secondary | ICD-10-CM

## 2017-08-02 LAB — CBC WITH DIFFERENTIAL/PLATELET
Basophils Absolute: 0 10*3/uL (ref 0.0–0.1)
Basophils Relative: 0 %
Eosinophils Absolute: 0 10*3/uL (ref 0.0–0.7)
Eosinophils Relative: 0 %
HCT: 44.2 % (ref 36.0–46.0)
HEMOGLOBIN: 13.2 g/dL (ref 12.0–15.0)
LYMPHS ABS: 0.6 10*3/uL — AB (ref 0.7–4.0)
Lymphocytes Relative: 5 %
MCH: 29.8 pg (ref 26.0–34.0)
MCHC: 29.9 g/dL — ABNORMAL LOW (ref 30.0–36.0)
MCV: 99.8 fL (ref 78.0–100.0)
MONOS PCT: 3 %
Monocytes Absolute: 0.3 10*3/uL (ref 0.1–1.0)
NEUTROS ABS: 12.3 10*3/uL — AB (ref 1.7–7.7)
Neutrophils Relative %: 92 %
Platelets: 208 10*3/uL (ref 150–400)
RBC: 4.43 MIL/uL (ref 3.87–5.11)
RDW: 16.4 % — ABNORMAL HIGH (ref 11.5–15.5)
WBC: 13.3 10*3/uL — AB (ref 4.0–10.5)

## 2017-08-02 LAB — PROTIME-INR
INR: 3.16
PROTHROMBIN TIME: 32.2 s — AB (ref 11.4–15.2)

## 2017-08-02 LAB — COMPREHENSIVE METABOLIC PANEL
ALK PHOS: 95 U/L (ref 38–126)
ALT: 13 U/L — AB (ref 14–54)
AST: 15 U/L (ref 15–41)
Albumin: 3.3 g/dL — ABNORMAL LOW (ref 3.5–5.0)
Anion gap: 11 (ref 5–15)
BILIRUBIN TOTAL: 1.2 mg/dL (ref 0.3–1.2)
BUN: 21 mg/dL — ABNORMAL HIGH (ref 6–20)
CALCIUM: 8.8 mg/dL — AB (ref 8.9–10.3)
CO2: 32 mmol/L (ref 22–32)
CREATININE: 1 mg/dL (ref 0.44–1.00)
Chloride: 90 mmol/L — ABNORMAL LOW (ref 101–111)
Glucose, Bld: 365 mg/dL — ABNORMAL HIGH (ref 65–99)
Potassium: 4.9 mmol/L (ref 3.5–5.1)
Sodium: 133 mmol/L — ABNORMAL LOW (ref 135–145)
Total Protein: 7 g/dL (ref 6.5–8.1)

## 2017-08-02 LAB — MAGNESIUM: MAGNESIUM: 1.7 mg/dL (ref 1.7–2.4)

## 2017-08-02 LAB — GLUCOSE, CAPILLARY
GLUCOSE-CAPILLARY: 264 mg/dL — AB (ref 65–99)
GLUCOSE-CAPILLARY: 134 mg/dL — AB (ref 65–99)
Glucose-Capillary: 395 mg/dL — ABNORMAL HIGH (ref 65–99)
Glucose-Capillary: 329 mg/dL — ABNORMAL HIGH (ref 65–99)

## 2017-08-02 LAB — ECHOCARDIOGRAM COMPLETE
HEIGHTINCHES: 57 in
Weight: 3448 oz

## 2017-08-02 LAB — HEMOGLOBIN A1C
Hgb A1c MFr Bld: 8 % — ABNORMAL HIGH (ref 4.8–5.6)
MEAN PLASMA GLUCOSE: 183 mg/dL

## 2017-08-02 LAB — HIV ANTIBODY (ROUTINE TESTING W REFLEX): HIV Screen 4th Generation wRfx: NONREACTIVE

## 2017-08-02 MED ORDER — WARFARIN SODIUM 1 MG PO TABS
0.5000 mg | ORAL_TABLET | Freq: Once | ORAL | Status: AC
  Administered 2017-08-02: 0.5 mg via ORAL
  Filled 2017-08-02: qty 1

## 2017-08-02 MED ORDER — INSULIN GLARGINE 100 UNIT/ML ~~LOC~~ SOLN
30.0000 [IU] | Freq: Every day | SUBCUTANEOUS | Status: DC
  Administered 2017-08-03 – 2017-08-04 (×2): 30 [IU] via SUBCUTANEOUS
  Filled 2017-08-02 (×3): qty 0.3

## 2017-08-02 MED ORDER — MAGNESIUM SULFATE 2 GM/50ML IV SOLN
2.0000 g | Freq: Once | INTRAVENOUS | Status: AC
  Administered 2017-08-02: 2 g via INTRAVENOUS
  Filled 2017-08-02: qty 50

## 2017-08-02 MED ORDER — METHYLPREDNISOLONE SODIUM SUCC 125 MG IJ SOLR
60.0000 mg | Freq: Two times a day (BID) | INTRAMUSCULAR | Status: DC
  Administered 2017-08-03 – 2017-08-04 (×3): 60 mg via INTRAVENOUS
  Filled 2017-08-02 (×3): qty 2

## 2017-08-02 MED ORDER — INSULIN ASPART 100 UNIT/ML ~~LOC~~ SOLN
5.0000 [IU] | Freq: Three times a day (TID) | SUBCUTANEOUS | Status: DC
  Administered 2017-08-02 – 2017-08-04 (×6): 5 [IU] via SUBCUTANEOUS

## 2017-08-02 NOTE — Progress Notes (Signed)
*  PRELIMINARY RESULTS* Echocardiogram 2D Echocardiogram has been performed.  Leavy Cella 08/02/2017, 2:27 PM

## 2017-08-02 NOTE — Progress Notes (Signed)
PROGRESS NOTE  Theresa [name] NOM:[id number] DOB: [birth year] DOA: 07/31/2017 PCP: Monico Blitz, MD  Brief History:  51 year old female with a history of diastolic CHF, COPD, continued tobacco use, coronary artery disease status post CABG 2007, diabetes mellitus, chronic respiratory failure on 3-4 L at home, and lupus anticoagulant on chronic anticoagulation presented with 2-week history of shortness of breath that has worsened over the past 2 days prior to admission.  She has had difficulty getting in and out of the shower because of shortness of breath.  The patient states that she ran out of all her prescription medications and did not take any medications for approximately 1 week because of financial difficulty.  She was finally able to get her prescriptions and restarted taking them on the day prior to Thanksgiving.  Nevertheless, the patient has complained of increasing lower extremity edema and orthopnea type symptoms over the past 2-3 days.  She has had to sleep on the couch propped up.  She denies any fevers, chills, nausea, vomiting, hemoptysis, headache, neck pain.  The patient has chronic abdominal pain and symptomatic cholelithiasis.  She was previously told by her primary care provider to seek surgical care in a tertiary care center, but has yet to do so.  Upon presentation, the patient was noted to be hypoxic with oxygen saturation 91%.  The patient was given furosemide IV and steroids.  Assessment/Plan: Acute on chronic respiratory failure with hypoxia -Secondary to decompensated CHF and COPD exacerbation -Wean back to baseline oxygen (3-4L) -Pulmonary hygiene -07/31/17--CTA chest--negative PE; diffuse bilateral upper lobe groundglass opacities with interlobular septal thickening; chronic bronchitic changes with peribronchial thickening in the bilateral LL and RML  Acute on chronic diastolic CHF -Secondary to dietary indiscretion and medication compliance -Continue  furosemide IV 40 mg twice daily -NEG 1 lb -NEG 3.5 L -Remains clinically fluid overloaded -Echocardiogram-results pending  COPD exacerbation -Continue Pulmicort -Continue intravenous steroids--decrease to bid -Continue duo nebs  Lupus anticoagulant -INR subtherapeutic at the time of admission -As discussed, patient was off warfarin for over a week before restarting the day prior to Thanksgiving -Lovenox bridge until INR >2  Coronary artery disease with history of STEMI -STEMI Feb 2017 with DES to SVG to PDA -continue Plavix -Continue metoprolol tartrate  Diabetes mellitus type 2, uncontrolled -11/26--Hemoglobin A1c--8.0 -Increase Lantus -NovoLog sliding scale -increase ISS to moderate scale -add pre-meal novolog  Symptomatic cholelithiasis -November 15, 2016 HIDA--negative -Will ultimately need a cholecystectomy at some point  Adrenal adenoma -Left adrenal adenoma--incidental finding on CT abdomen and pelvis -Outpatient surveillance  Essential hypertension -Continue metoprolol tartrate and lisinopril -improvement with diuresis    Disposition Plan:   Home likely 11/29 if stable Family Communication:   No Family at bedside  Consultants:  none  Code Status:  FULL  DVT Prophylaxis:  warfarin   Procedures: As Listed in Progress Note Above  Antibiotics: None      Subjective: Patient states that she is breathing much better.  She denies any fevers, chills, chest pain, nausea, vomiting, diarrhea, hematochezia, melena.  no dysuria or hematuria.  She complains of a chronic right upper quadrant abdominal pain.  She states that it is a little bit better.  It is worse with palpation.  It was no worse with eating today.  Objective: Vitals:   08/02/17 0659 08/02/17 0819 08/02/17 0825 08/02/17 0900  BP: 118/60     Pulse: 72     Resp: 18  Temp: 98 F (36.7 C)     TempSrc: Oral     SpO2: 94% 93% 96%   Weight:    97.8 kg (215 lb 8 oz)  Height:         Intake/Output Summary (Last 24 hours) at 08/02/2017 1426 Last data filed at 08/02/2017 1230 Gross per 24 hour  Intake -  Output 2300 ml  Net -2300 ml   Weight change:  Exam:   General:  Pt is alert, follows commands appropriately, not in acute distress  HEENT: No icterus, No thrush, No neck mass, [place]/AT  Cardiovascular: RRR, S1/S2, no rubs, no gallops  Respiratory: Bibasilar rales.  Minimal basilar wheeze.  Good air movement.  Abdomen: Soft/+BS, RUQ tender, non distended, no guarding  Extremities: 2+ LE edema, No lymphangitis, No petechiae, No rashes, no synovitis   Data Reviewed: I have personally reviewed following labs and imaging studies Basic Metabolic Panel: Recent Labs  Lab 07/31/17 1427 08/01/17 0626 08/02/17 0413  NA 139 133* 133*  K 3.9 4.1 4.9  CL 99* 94* 90*  CO2 31 28 32  GLUCOSE 239* 436* 365*  BUN 11 14 21*  CREATININE 0.93 0.92 1.00  CALCIUM 9.2 8.6* 8.8*  MG  --  1.6* 1.7   Liver Function Tests: Recent Labs  Lab 07/31/17 1427 08/01/17 0626 08/02/17 0413  AST 17 19 15   ALT 12* 14 13*  ALKPHOS 100 98 95  BILITOT 1.7* 1.3*  1.4* 1.2  PROT 7.1 7.2 7.0  ALBUMIN 3.2* 3.2* 3.3*   Recent Labs  Lab 07/31/17 1427  LIPASE 22   No results for input(s): AMMONIA in the last 168 hours. Coagulation Profile: Recent Labs  Lab 07/31/17 1427 08/01/17 0626 08/02/17 0413  INR 1.49 1.74 3.16   CBC: Recent Labs  Lab 07/31/17 1427 08/01/17 0626 08/02/17 0413  WBC 6.9 7.0 13.3*  NEUTROABS 5.3 6.3 12.3*  HGB 13.5 13.3 13.2  HCT 44.0 43.8 44.2  MCV 98.0 98.2 99.8  PLT 194 198 208   Cardiac Enzymes: Recent Labs  Lab 07/31/17 1427  TROPONINI <0.03   BNP: Invalid input(s): POCBNP CBG: Recent Labs  Lab 08/01/17 1122 08/01/17 1702 08/01/17 2211 08/02/17 0732 08/02/17 1202  GLUCAP 297* 217* 298* 395* 329*   HbA1C: Recent Labs    08/01/17 0626  HGBA1C 8.0*   Urine analysis:    Component Value Date/Time   COLORURINE  AMBER (A) 07/31/2017 1810   APPEARANCEUR HAZY (A) 07/31/2017 1810   LABSPEC >1.046 (H) 07/31/2017 1810   PHURINE 5.0 07/31/2017 1810   GLUCOSEU 50 (A) 07/31/2017 1810   HGBUR SMALL (A) 07/31/2017 1810   BILIRUBINUR NEGATIVE 07/31/2017 1810   KETONESUR NEGATIVE 07/31/2017 1810   PROTEINUR >=300 (A) 07/31/2017 1810   NITRITE NEGATIVE 07/31/2017 1810   LEUKOCYTESUR NEGATIVE 07/31/2017 1810   Sepsis Labs: @LABRCNTIP (procalcitonin:4,lacticidven:4) )No results found for this or any previous visit (from the past 240 hour(s)).   Scheduled Meds: . aspirin EC  81 mg Oral Daily  . budesonide (PULMICORT) nebulizer solution  0.25 mg Nebulization BID  . chlorhexidine  15 mL Mouth Rinse BID  . clopidogrel  75 mg Oral Daily  . furosemide  40 mg Intravenous BID  . gabapentin  300-600 mg Oral BID  . insulin aspart  0-20 Units Subcutaneous TID WC  . insulin aspart  0-5 Units Subcutaneous QHS  . insulin glargine  20 Units Subcutaneous Daily  . ipratropium-albuterol  3 mL Nebulization Q6H  . lisinopril  20 mg Oral  Daily  . mouth rinse  15 mL Mouth Rinse q12n4p  . [START ON 08/03/2017] methylPREDNISolone (SOLU-MEDROL) injection  60 mg Intravenous Q12H  . metoprolol tartrate  50 mg Oral BID  . nicotine  21 mg Transdermal Daily  . pantoprazole  40 mg Oral Daily  . sodium chloride flush  3 mL Intravenous Q12H  . traMADol  50 mg Oral BID  . warfarin  0.5 mg Oral Once  . Warfarin - Pharmacist Dosing Inpatient   Does not apply Q24H   Continuous Infusions: . sodium chloride      Procedures/Studies: Ct Angio Chest Pe W/cm &/or Wo Cm  Result Date: 07/31/2017 CLINICAL DATA:  Dyspnea, cough and congestion with right upper quadrant pain since yesterday. EXAM: CT ANGIOGRAPHY CHEST CT ABDOMEN AND PELVIS WITH CONTRAST TECHNIQUE: Multidetector CT imaging of the chest was performed using the standard protocol during bolus administration of intravenous contrast. Multiplanar CT image reconstructions and MIPs  were obtained to evaluate the vascular anatomy. Multidetector CT imaging of the abdomen and pelvis was performed using the standard protocol during bolus administration of intravenous contrast. CONTRAST:  100 cc Isovue 370 IV COMPARISON:  CXR 10/27/2016 and 07/31/2017, chest CT 01/23/2016 FINDINGS: CTA CHEST FINDINGS Cardiovascular: There is cardiomegaly without pericardial effusion. No acute pulmonary embolus. Three-vessel coronary arteriosclerosis is noted with post CABG change. Normal caliber to aorta with minimal aortic atherosclerosis. No dissection. Mediastinum/Nodes: Right upper paratracheal 2.5 cm short axis and prevascular 1.5 cm short axis lymphadenopathy with smaller prevascular and paratracheal lymph nodes present. Subcarinal lymphadenopathy the 2.1 cm short axis. Mild hilar lymphadenopathy is also noted more so on the right. Tracheostomy tube is noted with tip in the upper trachea. The mainstem bronchi are patent. No esophageal abnormality. Lungs/Pleura: Interlobular septal thickening with diffuse bilateral upper lobe predominant ground-glass opacities consistent stigmata of CHF. Streaky atelectasis and/or scarring is seen predominantly in the lower lobes. Small amount of fluid in the right minor and major fissures. Peribronchial thickening is also noted to the lower lobes, right middle lobe and lingula consistent with chronic bronchitic change. Musculoskeletal: Degenerative changes are seen along the dorsal spine. No acute nor suspicious osseous lesions. Review of the MIP images confirms the above findings. CT ABDOMEN and PELVIS FINDINGS Hepatobiliary: Hepatic steatosis without focal mass or biliary dilatation. 8 mm gallstone near the neck of the gallbladder without secondary signs of acute cholecystitis. Pancreas: Normal Spleen: Normal size spleen without mass. Adrenals/Urinary Tract: Lobular, low-density left adrenal mass measuring 3.7 x 2.4 cm on series 12 image 21, is stable in appearance. Based on  Hounsfield units, findings would be in keeping with an adenoma. No right adrenal mass. No obstructive uropathy. Mild right lower pole cortical irregularity suspicious for scarring. No obstructive uropathy. Nondistended urinary bladder. Stomach/Bowel: Physiologic distention of the stomach. Normal small bowel rotation without inflammation or obstruction. Mild submucosal fatty change of the cecum and ascending colon that may reflect stigmata of chronic inflammatory bowel. Normal-appearing appendix. Scattered diverticulosis along the distal descending and sigmoid colon without acute diverticulitis. Vascular/Lymphatic: Aortoiliac and branch vessel atherosclerosis without aneurysm. No lymphadenopathy by CT size criteria. Reproductive: Uterus and bilateral adnexa are unremarkable. Other: Soft tissue anasarca.  No ascites or free air. Musculoskeletal: No aggressive lytic or blastic disease. Mild degenerative change along the lower thoracic spine. No acute nor suspicious osseous abnormality Review of the MIP images confirms the above findings. IMPRESSION: 1. The patient is status post CABG. There is stable cardiomegaly with coronary arteriosclerosis re- demonstrated. 2. Nonspecific mediastinal  and right hilar lymphadenopathy slightly increased in prominence along the aortic arch and subcarinal portion since prior question chronic reactive adenopathy. 3. No acute pulmonary embolus. 4. Stigmata of CHF with interlobular septal thickening and ground-glass opacities predominantly in the upper lobes. Chronic bronchitic change of the lungs with peribronchial thickening re-demonstrated and diffuse scattered subsegmental areas of atelectasis at the lung bases. 5. Hepatic steatosis. 6. Uncomplicated cholelithiasis. 7. Stable lobular hypodense left adrenal mass consistent with an adenoma measuring 3.7 x 2.4 cm currently. 8. Colonic diverticulosis.  No acute diverticulitis. 9. Mild soft tissue anasarca. Electronically Signed   By: Ashley Royalty M.D.   On: 07/31/2017 17:59   Ct Abdomen Pelvis W Contrast  Result Date: 07/31/2017 CLINICAL DATA:  Dyspnea, cough and congestion with right upper quadrant pain since yesterday. EXAM: CT ANGIOGRAPHY CHEST CT ABDOMEN AND PELVIS WITH CONTRAST TECHNIQUE: Multidetector CT imaging of the chest was performed using the standard protocol during bolus administration of intravenous contrast. Multiplanar CT image reconstructions and MIPs were obtained to evaluate the vascular anatomy. Multidetector CT imaging of the abdomen and pelvis was performed using the standard protocol during bolus administration of intravenous contrast. CONTRAST:  100 cc Isovue 370 IV COMPARISON:  CXR 10/27/2016 and 07/31/2017, chest CT 01/23/2016 FINDINGS: CTA CHEST FINDINGS Cardiovascular: There is cardiomegaly without pericardial effusion. No acute pulmonary embolus. Three-vessel coronary arteriosclerosis is noted with post CABG change. Normal caliber to aorta with minimal aortic atherosclerosis. No dissection. Mediastinum/Nodes: Right upper paratracheal 2.5 cm short axis and prevascular 1.5 cm short axis lymphadenopathy with smaller prevascular and paratracheal lymph nodes present. Subcarinal lymphadenopathy the 2.1 cm short axis. Mild hilar lymphadenopathy is also noted more so on the right. Tracheostomy tube is noted with tip in the upper trachea. The mainstem bronchi are patent. No esophageal abnormality. Lungs/Pleura: Interlobular septal thickening with diffuse bilateral upper lobe predominant ground-glass opacities consistent stigmata of CHF. Streaky atelectasis and/or scarring is seen predominantly in the lower lobes. Small amount of fluid in the right minor and major fissures. Peribronchial thickening is also noted to the lower lobes, right middle lobe and lingula consistent with chronic bronchitic change. Musculoskeletal: Degenerative changes are seen along the dorsal spine. No acute nor suspicious osseous lesions. Review of the  MIP images confirms the above findings. CT ABDOMEN and PELVIS FINDINGS Hepatobiliary: Hepatic steatosis without focal mass or biliary dilatation. 8 mm gallstone near the neck of the gallbladder without secondary signs of acute cholecystitis. Pancreas: Normal Spleen: Normal size spleen without mass. Adrenals/Urinary Tract: Lobular, low-density left adrenal mass measuring 3.7 x 2.4 cm on series 12 image 21, is stable in appearance. Based on Hounsfield units, findings would be in keeping with an adenoma. No right adrenal mass. No obstructive uropathy. Mild right lower pole cortical irregularity suspicious for scarring. No obstructive uropathy. Nondistended urinary bladder. Stomach/Bowel: Physiologic distention of the stomach. Normal small bowel rotation without inflammation or obstruction. Mild submucosal fatty change of the cecum and ascending colon that may reflect stigmata of chronic inflammatory bowel. Normal-appearing appendix. Scattered diverticulosis along the distal descending and sigmoid colon without acute diverticulitis. Vascular/Lymphatic: Aortoiliac and branch vessel atherosclerosis without aneurysm. No lymphadenopathy by CT size criteria. Reproductive: Uterus and bilateral adnexa are unremarkable. Other: Soft tissue anasarca.  No ascites or free air. Musculoskeletal: No aggressive lytic or blastic disease. Mild degenerative change along the lower thoracic spine. No acute nor suspicious osseous abnormality Review of the MIP images confirms the above findings. IMPRESSION: 1. The patient is status post CABG. There is  stable cardiomegaly with coronary arteriosclerosis re- demonstrated. 2. Nonspecific mediastinal and right hilar lymphadenopathy slightly increased in prominence along the aortic arch and subcarinal portion since prior question chronic reactive adenopathy. 3. No acute pulmonary embolus. 4. Stigmata of CHF with interlobular septal thickening and ground-glass opacities predominantly in the upper  lobes. Chronic bronchitic change of the lungs with peribronchial thickening re-demonstrated and diffuse scattered subsegmental areas of atelectasis at the lung bases. 5. Hepatic steatosis. 6. Uncomplicated cholelithiasis. 7. Stable lobular hypodense left adrenal mass consistent with an adenoma measuring 3.7 x 2.4 cm currently. 8. Colonic diverticulosis.  No acute diverticulitis. 9. Mild soft tissue anasarca. Electronically Signed   By: Ashley Royalty M.D.   On: 07/31/2017 17:59   Dg Chest Port 1 View  Result Date: 07/31/2017 CLINICAL DATA:  Shortness of breath, cough. EXAM: PORTABLE CHEST 1 VIEW COMPARISON:  Radiograph of October 27, 2016. FINDINGS: Stable cardiomegaly and central pulmonary vascular congestion. Tracheostomy is in grossly good position. Sternotomy wires are noted. Probable bilateral pulmonary edema is noted. Fluid is noted in the right minor fissure. Bony thorax is unremarkable. Minimal bilateral pleural effusions are noted. IMPRESSION: Stable cardiomegaly and central pulmonary vascular congestion. Mild bilateral pulmonary edema is noted with minimal pleural effusions. Electronically Signed   By: Marijo Conception, M.D.   On: 07/31/2017 15:18    Naraya Stoneberg, DO  [facility] Pager [phone]  If 7PM-7AM, please contact night-coverage [email or web address] Password [email or web address] 08/02/2017, 2:26 PM   LOS: 2 days

## 2017-08-02 NOTE — Progress Notes (Addendum)
Inpatient Diabetes Program Recommendations  AACE/ADA: New Consensus Statement on Inpatient Glycemic Control (2015)  Target Ranges:  Prepandial:   less than 140 mg/dL      Peak postprandial:   less than 180 mg/dL (1-2 hours)      Critically ill patients:  140 - 180 mg/dL   Results for Theresa [name], Theresa [name] (MRN [id number]) as of 08/02/2017 09:30  Ref. Range 08/01/2017 07:34 08/01/2017 11:22 08/01/2017 17:02 08/01/2017 22:11 08/02/2017 07:32  Glucose-Capillary Latest Ref Range: 65 - 99 mg/dL 433 (H) 297 (H) 217 (H) 298 (H) 395 (H)   Review of Glycemic Control  Diabetes history: DM2 Outpatient Diabetes medications: Tresiba 25 units QHS Current orders for Inpatient glycemic control: Lantus 20 units daily, Novolog 0-20 units TID with meals, Novolog 0-5 units QHS; Solumedrol 60 mg Q8H  Inpatient Diabetes Program Recommendations: Insulin - Basal: Fasting glucose 318 mg/dl today. Please consider increasing Lantus to 25 units daily (give one time dose of Lantus 5 units x 1 now to total 25 units for today). Insulin - Meal Coverage: Please consider ordering Novolog 4 units TID with meals for meal coverage if patient eats at least 50% of meals.  Thanks, Barnie Alderman, RN, MSN, CDE Diabetes Coordinator Inpatient Diabetes Program [phone] (Team Pager from 8am to 5pm)

## 2017-08-02 NOTE — Progress Notes (Signed)
[place] for Warfarin (home med) Indication: lupus anticoagulant d/o  Allergies  Allergen Reactions  . Penicillins Rash    Has patient had a PCN reaction causing immediate rash, facial/tongue/throat swelling, SOB or lightheadedness with hypotension: Yes Has patient had a PCN reaction causing severe rash involving mucus membranes or skin necrosis: No Has patient had a PCN reaction that required hospitalization No Has patient had a PCN reaction occurring within the last 10 years: No If all of the above answers are "NO", then may proceed with Cephalosporin use.   REACTION: rash    Patient Measurements: Height: 4' 9"  (144.8 cm) Weight: 216 lb 4.8 oz (98.1 kg) IBW/kg (Calculated) : 38.6  Vital Signs: Temp: 98 F (36.7 C) (11/27 0659) Temp Source: Oral (11/27 0659) BP: 118/60 (11/27 0659) Pulse Rate: 72 (11/27 0659)  Labs: Recent Labs    07/31/17 1427 08/01/17 0626 08/02/17 0413  HGB 13.5 13.3 13.2  HCT 44.0 43.8 44.2  PLT 194 198 208  LABPROT 17.9* 20.2* 32.2*  INR 1.49 1.74 3.16  CREATININE 0.93 0.92 1.00  TROPONINI <0.03  --   --    Estimated Creatinine Clearance: 65.6 mL/min (by C-G formula based on SCr of 1 mg/dL).  Medical History: Past Medical History:  Diagnosis Date  . CAD (coronary artery disease)    s/p 2V CABG in 2007  . CAD (coronary artery disease) of artery bypass graft 11/03/2015  . Cervical cancer ([place])   . CHF (congestive heart failure) ([place])   . Chronic RUQ pain   . COPD (chronic obstructive pulmonary disease) ([place])   . Diabetes mellitus ([place])   . Endometriosis   . History of gallstones 01/2016   seen on Ultrasound  . History of HIDA scan 11/2016   normal  . HTN (hypertension)   . Hyperlipidemia   . Lupus anticoagulant disorder ([facility])    on coumadin  . Respiratory failure ([place])    s/p tracheostomy 2002  . ST elevation (STEMI) myocardial infarction involving right coronary artery ([place]) 10/29/15   stent to VG to PDA  . Tracheostomy in place [facility]), chronic since 2002 11/03/2015   Medications:  Medications Prior to Admission  Medication Sig Dispense Refill Last Dose  . albuterol (PROVENTIL HFA;VENTOLIN HFA) 108 (90 Base) MCG/ACT inhaler Inhale 1-2 puffs into the lungs every 6 (six) hours as needed for wheezing or shortness of breath.   07/31/2017 at Unknown time  . albuterol (PROVENTIL) (2.5 MG/3ML) 0.083% nebulizer solution Take 3 mLs (2.5 mg total) by nebulization every 4 (four) hours as needed for wheezing or shortness of breath. 75 mL 12 unknown  . clopidogrel (PLAVIX) 75 MG tablet TAKE 1 TABLET BY MOUTH ONCE DAILY **NEED  OFFICE  VISIT** (Patient taking differently: TAKE 1 TABLET BY MOUTH ONCE DAILY) 90 tablet 3 07/30/2017 at Unknown time  . furosemide (LASIX) 20 MG tablet Take 40 mg by mouth every morning. & extra as needed   07/30/2017 at Unknown time  . gabapentin (NEURONTIN) 300 MG capsule Take 300-600 mg by mouth 2 (two) times daily.   07/30/2017 at Unknown time  . insulin degludec (TRESIBA FLEXTOUCH) 100 UNIT/ML SOPN FlexTouch Pen Inject 25 Units into the skin daily at 10 pm.   07/30/2017 at Unknown time  . lisinopril (PRINIVIL,ZESTRIL) 20 MG tablet TAKE ONE TABLET BY MOUTH ONCE DAILY 90 tablet 1 07/30/2017 at Unknown time  . metoprolol (LOPRESSOR) 50 MG tablet Take 50 mg by mouth 2 (two) times daily.  07/30/2017 at 2130          . nitroGLYCERIN (NITROSTAT) 0.4 MG SL tablet Place 0.4 mg under the tongue every 5 (five) minutes as needed for chest pain.   unknown  . pantoprazole (PROTONIX) 40 MG tablet Take 40 mg by mouth daily.   07/30/2017 at Unknown time  . traMADol (ULTRAM) 50 MG tablet Take 1 tablet by mouth 2 (two) times daily.   07/30/2017 at Unknown time  . warfarin (COUMADIN) 5 MG tablet Take 2.5-5 mg by mouth See admin instructions. Pt takes 2.29m on Sundays - pt takes 5559mall other days, MANAGED BY PMD   07/30/2017 at 2130  . Tracheostomy Care KIT 1 kit by Does not apply  route daily. (Patient not taking: Reported on 07/31/2017) 30 each 6 Completed Course    Assessment: [age]o female on chronic coumadin.  INR with large increase overnight. No bleeding reported  Goal of Therapy:  INR 2-3 Monitor platelets by anticoagulation protocol: Yes   Plan:  D/c lovenox Coumadin 0.59m51moday INR daily  Lucynda Rosano Poteet 08/02/2017,10:12 AM

## 2017-08-02 NOTE — Care Management Note (Signed)
Case Management Note  Patient Details  Name: Theresa [name] MRN: [id number] Date of Birth: 16-Nov-1965  Subjective/Objective: Adm with dyspnea related to CHF/COPD exacerbation. From home with husband, ind with ADL's. Has scales for weighing, dietician consulted for diet education in regards to CHF. Patent has a trach and wears oxygen at home, provided by [facility]. She worries about not having tanks that will last her for transport to MD appts in [place]. LInda of [facility] notified and will follow up with patient.                    Action/Plan:Anticipate DC home, she is open to [facility] RN if needed at time of DC. CM following.    Expected Discharge Date:   08/03/2017 Expected Discharge Plan:  Home/Self Care  In-House Referral:     Discharge planning Services  CM Consult  Post Acute Care Choice:    Choice offered to:     DME Arranged:    DME Agency:  [place]:    [facility] Agency:     Status of Service:  In process, will continue to follow  If discussed at Long Length of Stay Meetings, dates discussed:    Additional Comments:  Catalina Salasar, Chauncey [place], RN 08/02/2017, 10:52 AM

## 2017-08-03 DIAGNOSIS — Z720 Tobacco use: Secondary | ICD-10-CM

## 2017-08-03 DIAGNOSIS — I509 Heart failure, unspecified: Secondary | ICD-10-CM

## 2017-08-03 DIAGNOSIS — R0902 Hypoxemia: Secondary | ICD-10-CM

## 2017-08-03 DIAGNOSIS — I25810 Atherosclerosis of coronary artery bypass graft(s) without angina pectoris: Secondary | ICD-10-CM

## 2017-08-03 DIAGNOSIS — I5033 Acute on chronic diastolic (congestive) heart failure: Secondary | ICD-10-CM

## 2017-08-03 DIAGNOSIS — D6862 Lupus anticoagulant syndrome: Secondary | ICD-10-CM

## 2017-08-03 DIAGNOSIS — I2510 Atherosclerotic heart disease of native coronary artery without angina pectoris: Secondary | ICD-10-CM

## 2017-08-03 DIAGNOSIS — R0602 Shortness of breath: Secondary | ICD-10-CM

## 2017-08-03 DIAGNOSIS — E785 Hyperlipidemia, unspecified: Secondary | ICD-10-CM

## 2017-08-03 DIAGNOSIS — I10 Essential (primary) hypertension: Secondary | ICD-10-CM

## 2017-08-03 DIAGNOSIS — E1165 Type 2 diabetes mellitus with hyperglycemia: Secondary | ICD-10-CM

## 2017-08-03 DIAGNOSIS — Z794 Long term (current) use of insulin: Secondary | ICD-10-CM

## 2017-08-03 LAB — PROTIME-INR
INR: 3.07
PROTHROMBIN TIME: 31.5 s — AB (ref 11.4–15.2)

## 2017-08-03 LAB — CBC WITH DIFFERENTIAL/PLATELET
Basophils Absolute: 0 10*3/uL (ref 0.0–0.1)
Basophils Relative: 0 %
EOS ABS: 0 10*3/uL (ref 0.0–0.7)
EOS PCT: 0 %
HCT: 45.6 % (ref 36.0–46.0)
HEMOGLOBIN: 13.4 g/dL (ref 12.0–15.0)
LYMPHS ABS: 0.9 10*3/uL (ref 0.7–4.0)
LYMPHS PCT: 5 %
MCH: 29.6 pg (ref 26.0–34.0)
MCHC: 29.4 g/dL — AB (ref 30.0–36.0)
MCV: 100.9 fL — ABNORMAL HIGH (ref 78.0–100.0)
MONOS PCT: 7 %
Monocytes Absolute: 1.2 10*3/uL — ABNORMAL HIGH (ref 0.1–1.0)
NEUTROS PCT: 88 %
Neutro Abs: 15.7 10*3/uL — ABNORMAL HIGH (ref 1.7–7.7)
Platelets: 230 10*3/uL (ref 150–400)
RBC: 4.52 MIL/uL (ref 3.87–5.11)
RDW: 16.4 % — ABNORMAL HIGH (ref 11.5–15.5)
WBC: 17.8 10*3/uL — ABNORMAL HIGH (ref 4.0–10.5)

## 2017-08-03 LAB — MAGNESIUM: Magnesium: 2.3 mg/dL (ref 1.7–2.4)

## 2017-08-03 LAB — GLUCOSE, CAPILLARY
GLUCOSE-CAPILLARY: 211 mg/dL — AB (ref 65–99)
GLUCOSE-CAPILLARY: 172 mg/dL — AB (ref 65–99)
Glucose-Capillary: 287 mg/dL — ABNORMAL HIGH (ref 65–99)
Glucose-Capillary: 309 mg/dL — ABNORMAL HIGH (ref 65–99)

## 2017-08-03 LAB — BASIC METABOLIC PANEL
Anion gap: 8 (ref 5–15)
BUN: 26 mg/dL — AB (ref 6–20)
CHLORIDE: 89 mmol/L — AB (ref 101–111)
CO2: 38 mmol/L — AB (ref 22–32)
CREATININE: 1.03 mg/dL — AB (ref 0.44–1.00)
Calcium: 8.9 mg/dL (ref 8.9–10.3)
GFR calc Af Amer: >60 mL/min (ref >60)
GFR calc non Af Amer: >60 mL/min (ref >60)
Glucose, Bld: 207 mg/dL — ABNORMAL HIGH (ref 65–99)
POTASSIUM: 5 mmol/L (ref 3.5–5.1)
Sodium: 135 mmol/L (ref 135–145)

## 2017-08-03 MED ORDER — FUROSEMIDE 40 MG PO TABS
40.0000 mg | ORAL_TABLET | Freq: Two times a day (BID) | ORAL | Status: DC
  Administered 2017-08-03 – 2017-08-04 (×2): 40 mg via ORAL
  Filled 2017-08-03 (×2): qty 1

## 2017-08-03 MED ORDER — WARFARIN SODIUM 1 MG PO TABS
1.0000 mg | ORAL_TABLET | Freq: Once | ORAL | Status: AC
  Administered 2017-08-03: 1 mg via ORAL
  Filled 2017-08-03: qty 1

## 2017-08-03 NOTE — Progress Notes (Signed)
PROGRESS NOTE    Theresa [name]  ZOX:[id number] DOB: [birth year] DOA: 07/31/2017 PCP: Monico Blitz, MD    Brief Narrative:  51 year old female with a history of diastolic CHF, COPD, continued tobacco use, coronary artery disease status post CABG 2007, diabetes mellitus, chronic respiratory failure on 3-4 L at home, and lupus anticoagulant on chronic anticoagulation presented with 2-week history of shortness of breath that has worsened over the past 2 days prior to admission. She has had difficulty getting in and out of the shower because of shortness of breath. The patient states that she ran out of all her prescription medications and did not take any medications for approximately 1 week because of financial difficulty. She was finally able to get her prescriptions and restarted taking them on the day prior to Thanksgiving. Nevertheless, the patient has complained of increasing lower extremity edema and orthopnea type symptoms over the past 2-3 days. She has had to sleep on the couch propped up. She denies any fevers, chills, nausea, vomiting, hemoptysis, headache, neck pain. The patient has chronic abdominal pain and symptomatic cholelithiasis. She was previously told by her primary care provider to seek surgical care in a tertiary care center, but has yet to do so. Upon presentation, the patient was noted to be hypoxic with oxygen saturation 91%. The patient was given furosemide IV and steroids.     Assessment & Plan:   Active Problems:   DM (diabetes mellitus) ([place])   Hyperlipidemia LDL goal <70   Essential hypertension   CAD, NATIVE VESSEL   Tobacco abuse   Respiratory failure (HCC)   Coronary artery disease involving coronary bypass graft of native heart without angina pectoris   COPD exacerbation (HCC)   Lupus anticoagulant disorder (HCC)   Acute respiratory failure with hypoxemia (HCC)   CHF (congestive heart failure) (HCC)   Dyspnea   Acute on chronic respiratory  failure with hypoxia ([place])   Uncontrolled type 2 diabetes mellitus with hyperglycemia, with long-term current use of insulin (HCC)   Acute on chronic diastolic CHF (congestive heart failure) (HCC)   Acute on chronic respiratory failure with hypoxia -Secondary to decompensated CHF and COPD exacerbation -Wean back to baseline oxygen (3-4L) -Pulmonary hygiene -07/31/17--CTA chest--negative PE;diffuse bilateral upper lobe groundglass opacities with interlobular septal thickening;chronic bronchitic changes with peribronchial thickening in the bilateralLL and RML -Is on home oxygen requirement however is experiencing significant shortness of breath with minimal ambulation and was requiring prolonged recovery from minimal ambulation  Acute on chronic diastolic CHF -Secondary to dietary indiscretion and medication compliance -Transition to p.o. Lasix given slight increase in creatinine -Repeat BMP in the morning -No weight recorded from this morning -NEG 5.6 L -Still edema of lower extremities present bilaterally -Echocardiogram- There is mild LVH with LVEF approximately 50%. Akinesis of the mid anteroseptal wall noted as well as hypokinesis of the mid to basal inferior wall. Restrictive diastolic filling pattern. Moderate left atrial enlargement. Mildly calcified mitral annulus with mild mitral regurgitation. Mild tricuspid regurgitation with elevated PASP of 47 mmHg.  COPD exacerbation -Continue Pulmicort -Continue intravenous steroids--decrease to bid -Continue duo nebs  Lupus anticoagulant -INR subtherapeutic at the time of admission -As discussed, patient was off warfarin for over a week before restarting the day prior to Thanksgiving -Lovenox bridge until INR>2  -INR today of 3.07  Coronary artery disease with history ofSTEMI -STEMI Feb 2017with DES to SVG toPDA -continue Plavix -Continue metoprolol tartrate -Could explain echocardiogram results  Diabetes mellitus type  2, uncontrolled -11/26--Hemoglobin A1c--8.0 -  Increase Lantus -NovoLog sliding scale -increase ISS to moderate scale -add pre-meal novolog -We will need to discuss with patient outpatient compliance with diet as well as importance of medication compliance  Symptomatic cholelithiasis -March 12, 2018HIDA--negative -Will ultimately need a cholecystectomy at some point  Adrenal adenoma -Left adrenal adenoma--incidental finding on CT abdomen and pelvis -Outpatient surveillance  Essential hypertension -Continue metoprolol tartrate and lisinopril -improvement with diuresis     DVT prophylaxis: Warfarin Code Status: Full code Family Communication: Husband is bedside Disposition Plan: Pending improvement in respiratory status   Consultants:   None  Procedures:   Echocardiogram  Antimicrobials:   None   Subjective: Patient seen this morning.  She states she had just ambulated to the restroom and was recovering.  She is in a tripod position breathing heavily.  She reports that she still does not feel back to her baseline.  Objective: Vitals:   08/03/17 0154 08/03/17 0851 08/03/17 0853 08/03/17 0857  BP:   111/79   Pulse:   64   Resp:   20   Temp:   97.9 F (36.6 C)   TempSrc:   Oral   SpO2: 97% 94% 94% 97%  Weight:      Height:        Intake/Output Summary (Last 24 hours) at 08/03/2017 1418 Last data filed at 08/03/2017 1214 Gross per 24 hour  Intake 290 ml  Output 3200 ml  Net -2910 ml   Filed Weights   07/31/17 2139 08/01/17 0500 08/02/17 0900  Weight: 98.2 kg (216 lb 9.6 oz) 98.1 kg (216 lb 4.8 oz) 97.8 kg (215 lb 8 oz)    Examination:  General exam: Moderate distress, in tripod position breathing heavily Respiratory system: Bibasilar rales, rhonchi in upper lung fields bilaterally, prolonged expiratory phase Cardiovascular system: S1 & S2 heard, RRR. No JVD, murmurs, rubs, gallops or clicks.  1+ pedal edema bilaterally. Gastrointestinal system:  Abdomen is obese, nondistended, soft and nontender. No organomegaly or masses felt. Normal bowel sounds heard. Central nervous system: Alert and oriented. No focal neurological deficits. Extremities: Symmetric 5 x 5 power. Skin: No rashes, lesions or ulcers Psychiatry: Judgement and insight appear normal. Mood & affect appropriate.     Data Reviewed: I have personally reviewed following labs and imaging studies  CBC: Recent Labs  Lab 07/31/17 1427 08/01/17 0626 08/02/17 0413 08/03/17 0412  WBC 6.9 7.0 13.3* 17.8*  NEUTROABS 5.3 6.3 12.3* 15.7*  HGB 13.5 13.3 13.2 13.4  HCT 44.0 43.8 44.2 45.6  MCV 98.0 98.2 99.8 100.9*  PLT 194 198 208 595   Basic Metabolic Panel: Recent Labs  Lab 07/31/17 1427 08/01/17 0626 08/02/17 0413 08/03/17 0412  NA 139 133* 133* 135  K 3.9 4.1 4.9 5.0  CL 99* 94* 90* 89*  CO2 31 28 32 38*  GLUCOSE 239* 436* 365* 207*  BUN 11 14 21* 26*  CREATININE 0.93 0.92 1.00 1.03*  CALCIUM 9.2 8.6* 8.8* 8.9  MG  --  1.6* 1.7 2.3   GFR: Estimated Creatinine Clearance: 63.6 mL/min (A) (by C-G formula based on SCr of 1.03 mg/dL (H)). Liver Function Tests: Recent Labs  Lab 07/31/17 1427 08/01/17 0626 08/02/17 0413  AST 17 19 15   ALT 12* 14 13*  ALKPHOS 100 98 95  BILITOT 1.7* 1.3*  1.4* 1.2  PROT 7.1 7.2 7.0  ALBUMIN 3.2* 3.2* 3.3*   Recent Labs  Lab 07/31/17 1427  LIPASE 22   No results for input(s): AMMONIA in the last  168 hours. Coagulation Profile: Recent Labs  Lab 07/31/17 1427 08/01/17 0626 08/02/17 0413 08/03/17 0412  INR 1.49 1.74 3.16 3.07   Cardiac Enzymes: Recent Labs  Lab 07/31/17 1427  TROPONINI <0.03   BNP (last 3 results) No results for input(s): PROBNP in the last 8760 hours. HbA1C: Recent Labs    08/01/17 0626  HGBA1C 8.0*   CBG: Recent Labs  Lab 08/02/17 1202 08/02/17 1630 08/02/17 2119 08/03/17 0731 08/03/17 1149  GLUCAP 329* 264* 134* 211* 172*   Lipid Profile: No results for input(s): CHOL,  HDL, LDLCALC, TRIG, CHOLHDL, LDLDIRECT in the last 72 hours. Thyroid Function Tests: Recent Labs    08/01/17 0626  TSH 0.651   Anemia Panel: No results for input(s): VITAMINB12, FOLATE, FERRITIN, TIBC, IRON, RETICCTPCT in the last 72 hours. Sepsis Labs: Recent Labs  Lab 07/31/17 1443 07/31/17 1727  LATICACIDVEN 2.53* 2.90*    No results found for this or any previous visit (from the past 240 hour(s)).       Radiology Studies: No results found.      Scheduled Meds: . aspirin EC  81 mg Oral Daily  . budesonide (PULMICORT) nebulizer solution  0.25 mg Nebulization BID  . chlorhexidine  15 mL Mouth Rinse BID  . clopidogrel  75 mg Oral Daily  . furosemide  40 mg Intravenous BID  . gabapentin  300-600 mg Oral BID  . insulin aspart  0-20 Units Subcutaneous TID WC  . insulin aspart  0-5 Units Subcutaneous QHS  . insulin aspart  5 Units Subcutaneous TID WC  . insulin glargine  30 Units Subcutaneous Daily  . ipratropium-albuterol  3 mL Nebulization Q6H  . lisinopril  20 mg Oral Daily  . mouth rinse  15 mL Mouth Rinse q12n4p  . methylPREDNISolone (SOLU-MEDROL) injection  60 mg Intravenous Q12H  . metoprolol tartrate  50 mg Oral BID  . nicotine  21 mg Transdermal Daily  . pantoprazole  40 mg Oral Daily  . sodium chloride flush  3 mL Intravenous Q12H  . traMADol  50 mg Oral BID  . warfarin  1 mg Oral Once  . Warfarin - Pharmacist Dosing Inpatient   Does not apply Q24H   Continuous Infusions: . sodium chloride       LOS: 3 days    Time spent: 35 minutes    Loretha Stapler, MD [facility] Pager [phone]  If 7PM-7AM, please contact night-coverage [email or web address] Password [email or web address] 08/03/2017, 2:18 PM

## 2017-08-03 NOTE — Care Management Note (Signed)
Case Management Note  Patient Details  Name: MYLIN GIGNAC MRN: [id number] Date of Birth: 07-16-1966   Action/Plan: Patient recommended for [facility] PT. Would like [facility]. Vaughan Basta of [facility] notified and will obtain orders from [place]. Patient has questions about applying for Medicaid. Advised her to speak with PCP and contacted our Financial counselor who agrees to talk with patient also.   Expected Discharge Date:     08/04/2017             Expected Discharge Plan:  Home/Self Care  In-House Referral:     Discharge planning Services  CM Consult  Post Acute Care Choice:  Home Health Choice offered to:  Patient  DME Arranged:    DME Agency:  [place] Arranged:  PT [facility] Agency:  [place]  Status of Service:  In process, will continue to follow  If discussed at Long Length of Stay Meetings, dates discussed:    Additional Comments:  Mak Bonny, Chauncey [place], RN 08/03/2017, 11:59 AM

## 2017-08-03 NOTE — Evaluation (Signed)
Physical Therapy Evaluation Patient Details Name: NYELI HOLTMEYER MRN: [id number] DOB: 1965-09-23 Today's Date: 08/03/2017   History of Present Illness  MERDIS SNODGRASS is an 51 y.o. female with known Diastolic HF and COPD who still smokes, now with increased weight gain (about 30# over 5 months) and increasing lower extremity edema, SOB, wheeze, and difficulty breathing. Came to ER. Found to have some CHF on CT chest,  (no PE noted) and some concern for COPD. One dose of steroids in the ER, doing a little better on higher flow o2 through her trach site (which has been attempted to be reversed twice to no avail due to vocal cord paralysis and stridor).  Clinical Impression  Patient received asleep in bed but easily woken and pleasant, willing to participate in PT evaluation this morning. She appears to be independent with bed and functional mobility, and is able to ambulate well in her room with good balance and safety awareness. She does report a history of falls, and states that her main limitation has been based on her respiratory condition; she does state concern that she will become severely short of breath when ambulating distances further than inside of her room/household distances. O2 sats remained WNL during session. Due to history of and patient concern regarding falls and stair navigation at home, recommend HHPT moving forward. Patient left sitting upright at EOB with RN present and attending to patient, all other patient needs and concerns addressed at this time.     Follow Up Recommendations Home health PT    Equipment Recommendations  Other (comment)(possible walker attachment to hold O2 cannister )    Recommendations for Other Services       Precautions / Restrictions Precautions Precautions: Fall Restrictions Weight Bearing Restrictions: No      Mobility  Bed Mobility Overal bed mobility: Independent                Transfers Overall transfer level: Modified  independent                  Ambulation/Gait Ambulation/Gait assistance: Modified independent (Device/Increase time) Ambulation Distance (Feet): 14 Feet(limiited by concerns for SOB ) Assistive device: None Gait Pattern/deviations: WFL(Within Functional Limits)        Stairs            Wheelchair Mobility    Modified Rankin (Stroke Patients Only)       Balance Overall balance assessment: History of Falls;No apparent balance deficits (not formally assessed)                                           Pertinent Vitals/Pain Pain Assessment: 0-10 Pain Score: 4  Pain Location: Right upper abdominal quadrant  Pain Descriptors / Indicators: Sore Pain Intervention(s): Monitored during session;Limited activity within patient's tolerance    Home Living Family/patient expects to be discharged to:: Private residence Living Arrangements: Spouse/significant other Available Help at Discharge: Family Type of Home: House Home Access: Stairs to enter   [employer] of Steps: 2 steps, U railing  Home Layout: One level Home Equipment: Walker - 2 wheels;Cane - single point;Bedside commode;Wheelchair - manual;Grab bars - tub/shower;Tub bench;Shower seat      Prior Function Level of Independence: Independent         Comments: mostly limited by SOB      Hand Dominance  Extremity/Trunk Assessment   Upper Extremity Assessment Upper Extremity Assessment: Overall WFL for tasks assessed    Lower Extremity Assessment Lower Extremity Assessment: Overall WFL for tasks assessed    Cervical / Trunk Assessment Cervical / Trunk Assessment: Kyphotic  Communication   Communication: No difficulties  Cognition Arousal/Alertness: Awake/alert Behavior During Therapy: WFL for tasks assessed/performed Overall Cognitive Status: Within Functional Limits for tasks assessed                                        General  Comments General comments (skin integrity, edema, etc.): patient reports history of multiple falls but does not demonstrate balance deficit at PT eval    Exercises     Assessment/Plan    PT Assessment Patient needs continued PT services  PT Problem List Decreased mobility;Decreased strength;Decreased coordination;Decreased activity tolerance;Cardiopulmonary status limiting activity       PT Treatment Interventions DME instruction;Therapeutic activities;Gait training;Therapeutic exercise;Patient/family education;Stair training;Balance training;Neuromuscular re-education    PT Goals (Current goals can be found in the Care Plan section)  Acute Rehab PT Goals Patient Stated Goal: to go home  PT Goal Formulation: With patient Time For Goal Achievement: 08/17/17 Potential to Achieve Goals: Good    Frequency Min 3X/week   Barriers to discharge        Co-evaluation               AM-PAC PT "6 Clicks" Daily Activity  Outcome Measure Difficulty turning over in bed (including adjusting bedclothes, sheets and blankets)?: None Difficulty moving from lying on back to sitting on the side of the bed? : None Difficulty sitting down on and standing up from a chair with arms (e.g., wheelchair, bedside commode, etc,.)?: None Help needed moving to and from a bed to chair (including a wheelchair)?: None Help needed walking in hospital room?: None Help needed climbing 3-5 steps with a railing? : A Little 6 Click Score: 23    End of Session Equipment Utilized During Treatment: Oxygen Activity Tolerance: Patient tolerated treatment well Patient left: in bed;with call bell/phone within reach;with nursing/sitter in room Nurse Communication: Mobility status PT Visit Diagnosis: History of falling (Z91.81);Other abnormalities of gait and mobility (R26.89)    Time: 1245-8099 PT Time Calculation (min) (ACUTE ONLY): 28 min   Charges:   PT Evaluation $PT Eval Low Complexity: 1 Low PT  Treatments $Self Care/Home Management: 8-22   PT G Codes:   PT G-Codes **NOT FOR INPATIENT CLASS** Functional Assessment Tool Used: AM-PAC 6 Clicks Basic Mobility;Clinical judgement Functional Limitation: Mobility: Walking and moving around Mobility: Walking and Moving Around Current Status (I3382): At least 1 percent but less than 20 percent impaired, limited or restricted Mobility: Walking and Moving Around Goal Status [phone]): At least 1 percent but less than 20 percent impaired, limited or restricted    Deniece Ree PT, DPT, CBIS  Supplemental Physical Therapist [facility]

## 2017-08-03 NOTE — Progress Notes (Signed)
[place] for Warfarin (home med) Indication: lupus anticoagulant d/o  Allergies  Allergen Reactions  . Penicillins Rash    Has patient had a PCN reaction causing immediate rash, facial/tongue/throat swelling, SOB or lightheadedness with hypotension: Yes Has patient had a PCN reaction causing severe rash involving mucus membranes or skin necrosis: No Has patient had a PCN reaction that required hospitalization No Has patient had a PCN reaction occurring within the last 10 years: No If all of the above answers are "NO", then may proceed with Cephalosporin use.   REACTION: rash    Patient Measurements: Height: '4\' 9"'$  (144.8 cm) Weight: 215 lb 8 oz (97.8 kg) IBW/kg (Calculated) : 38.6  Vital Signs: Temp: 97.9 F (36.6 C) (11/28 0853) Temp Source: Oral (11/28 0853) BP: 111/79 (11/28 0853) Pulse Rate: 64 (11/28 0853)  Labs: Recent Labs    07/31/17 1427 08/01/17 0626 08/02/17 0413 08/03/17 0412  HGB 13.5 13.3 13.2 13.4  HCT 44.0 43.8 44.2 45.6  PLT 194 198 208 230  LABPROT 17.9* 20.2* 32.2* 31.5*  INR 1.49 1.74 3.16 3.07  CREATININE 0.93 0.92 1.00 1.03*  TROPONINI <0.03  --   --   --    Estimated Creatinine Clearance: 63.6 mL/min (A) (by C-G formula based on SCr of 1.03 mg/dL (H)).  Medical History: Past Medical History:  Diagnosis Date  . CAD (coronary artery disease)    s/p 2V CABG in 2007  . CAD (coronary artery disease) of artery bypass graft 11/03/2015  . Cervical cancer ([place])   . CHF (congestive heart failure) ([place])   . Chronic RUQ pain   . COPD (chronic obstructive pulmonary disease) ([place])   . Diabetes mellitus ([place])   . Endometriosis   . History of gallstones 01/2016   seen on Ultrasound  . History of HIDA scan 11/2016   normal  . HTN (hypertension)   . Hyperlipidemia   . Lupus anticoagulant disorder (HCC)    on coumadin  . Respiratory failure ([place])    s/p tracheostomy 2002  . ST elevation (STEMI) myocardial  infarction involving right coronary artery ([place]) 10/29/15   stent to VG to PDA  . Tracheostomy in place [facility]), chronic since 2002 11/03/2015   Medications:  Medications Prior to Admission  Medication Sig Dispense Refill Last Dose  . albuterol (PROVENTIL HFA;VENTOLIN HFA) 108 (90 Base) MCG/ACT inhaler Inhale 1-2 puffs into the lungs every 6 (six) hours as needed for wheezing or shortness of breath.   07/31/2017 at Unknown time  . albuterol (PROVENTIL) (2.5 MG/3ML) 0.083% nebulizer solution Take 3 mLs (2.5 mg total) by nebulization every 4 (four) hours as needed for wheezing or shortness of breath. 75 mL 12 unknown  . clopidogrel (PLAVIX) 75 MG tablet TAKE 1 TABLET BY MOUTH ONCE DAILY **NEED  OFFICE  VISIT** (Patient taking differently: TAKE 1 TABLET BY MOUTH ONCE DAILY) 90 tablet 3 07/30/2017 at Unknown time  . furosemide (LASIX) 20 MG tablet Take 40 mg by mouth every morning. & extra as needed   07/30/2017 at Unknown time  . gabapentin (NEURONTIN) 300 MG capsule Take 300-600 mg by mouth 2 (two) times daily.   07/30/2017 at Unknown time  . insulin degludec (TRESIBA FLEXTOUCH) 100 UNIT/ML SOPN FlexTouch Pen Inject 25 Units into the skin daily at 10 pm.   07/30/2017 at Unknown time  . lisinopril (PRINIVIL,ZESTRIL) 20 MG tablet TAKE ONE TABLET BY MOUTH ONCE DAILY 90 tablet 1 07/30/2017 at Unknown time  . metoprolol (LOPRESSOR)  50 MG tablet Take 50 mg by mouth 2 (two) times daily.   07/30/2017 at 2130          . nitroGLYCERIN (NITROSTAT) 0.4 MG SL tablet Place 0.4 mg under the tongue every 5 (five) minutes as needed for chest pain.   unknown  . pantoprazole (PROTONIX) 40 MG tablet Take 40 mg by mouth daily.   07/30/2017 at Unknown time  . traMADol (ULTRAM) 50 MG tablet Take 1 tablet by mouth 2 (two) times daily.   07/30/2017 at Unknown time  . warfarin (COUMADIN) 5 MG tablet Take 2.5-5 mg by mouth See admin instructions. Pt takes 2.106m on Sundays - pt takes 579mall other days, MANAGED BY PMD   07/30/2017  at 2130  . Tracheostomy Care KIT 1 kit by Does not apply route daily. (Patient not taking: Reported on 07/31/2017) 30 each 6 Completed Course    Assessment: [age]o female on chronic coumadin.  INR 3.07 . No bleeding reported  Goal of Therapy:  INR 2-3 Monitor platelets by anticoagulation protocol: Yes   Plan:  Coumadin 1 mg today INR daily  Jayleon Mcfarlane P[place]et 08/03/2017,11:01 AM

## 2017-08-04 DIAGNOSIS — E1100 Type 2 diabetes mellitus with hyperosmolarity without nonketotic hyperglycemic-hyperosmolar coma (NKHHC): Secondary | ICD-10-CM

## 2017-08-04 DIAGNOSIS — J441 Chronic obstructive pulmonary disease with (acute) exacerbation: Principal | ICD-10-CM

## 2017-08-04 DIAGNOSIS — J9621 Acute and chronic respiratory failure with hypoxia: Secondary | ICD-10-CM

## 2017-08-04 LAB — CBC WITH DIFFERENTIAL/PLATELET
BASOS ABS: 0 10*3/uL (ref 0.0–0.1)
BASOS PCT: 0 %
EOS PCT: 0 %
Eosinophils Absolute: 0 10*3/uL (ref 0.0–0.7)
HCT: 45.1 % (ref 36.0–46.0)
Hemoglobin: 13.3 g/dL (ref 12.0–15.0)
Lymphocytes Relative: 5 %
Lymphs Abs: 0.6 10*3/uL — ABNORMAL LOW (ref 0.7–4.0)
MCH: 29.4 pg (ref 26.0–34.0)
MCHC: 29.5 g/dL — AB (ref 30.0–36.0)
MCV: 99.6 fL (ref 78.0–100.0)
MONO ABS: 1 10*3/uL (ref 0.1–1.0)
MONOS PCT: 8 %
NEUTROS ABS: 10.7 10*3/uL — AB (ref 1.7–7.7)
NEUTROS PCT: 87 %
PLATELETS: 206 10*3/uL (ref 150–400)
RBC: 4.53 MIL/uL (ref 3.87–5.11)
RDW: 16.1 % — AB (ref 11.5–15.5)
WBC: 12.2 10*3/uL — ABNORMAL HIGH (ref 4.0–10.5)

## 2017-08-04 LAB — GLUCOSE, CAPILLARY
GLUCOSE-CAPILLARY: 314 mg/dL — AB (ref 65–99)
Glucose-Capillary: 252 mg/dL — ABNORMAL HIGH (ref 65–99)

## 2017-08-04 LAB — BASIC METABOLIC PANEL
ANION GAP: 9 (ref 5–15)
BUN: 29 mg/dL — ABNORMAL HIGH (ref 6–20)
CALCIUM: 8.6 mg/dL — AB (ref 8.9–10.3)
CO2: 35 mmol/L — AB (ref 22–32)
Chloride: 91 mmol/L — ABNORMAL LOW (ref 101–111)
Creatinine, Ser: 1.01 mg/dL — ABNORMAL HIGH (ref 0.44–1.00)
GFR calc non Af Amer: >60 mL/min (ref >60)
Glucose, Bld: 368 mg/dL — ABNORMAL HIGH (ref 65–99)
POTASSIUM: 4.4 mmol/L (ref 3.5–5.1)
Sodium: 135 mmol/L (ref 135–145)

## 2017-08-04 LAB — PROTIME-INR
INR: 2.09
Prothrombin Time: 23.3 seconds — ABNORMAL HIGH (ref 11.4–15.2)

## 2017-08-04 MED ORDER — FUROSEMIDE 40 MG PO TABS: 40.0000 mg | ORAL_TABLET | Freq: Two times a day (BID) | ORAL | 0 refills | Status: DC

## 2017-08-04 MED ORDER — WARFARIN SODIUM 5 MG PO TABS: 5.0000 mg | ORAL_TABLET | Freq: Once | ORAL | Status: DC

## 2017-08-04 MED ORDER — PREDNISONE 20 MG PO TABS: 40.0000 mg | ORAL_TABLET | Freq: Every day | ORAL | 0 refills | Status: AC

## 2017-08-04 MED ORDER — PREDNISONE 20 MG PO TABS: 40.0000 mg | ORAL_TABLET | Freq: Every day | ORAL | Status: DC

## 2017-08-04 NOTE — Progress Notes (Signed)
Inpatient Diabetes Program Recommendations  AACE/ADA: New Consensus Statement on Inpatient Glycemic Control (2015)  Target Ranges:  Prepandial:   less than 140 mg/dL      Peak postprandial:   less than 180 mg/dL (1-2 hours)      Critically ill patients:  140 - 180 mg/dL  Results for MORGIN, HALLS (MRN [id number]) as of 08/04/2017 11:09  Ref. Range 08/03/2017 07:31 08/03/2017 11:49 08/03/2017 16:41 08/03/2017 21:25 08/04/2017 07:15  Glucose-Capillary Latest Ref Range: 65 - 99 mg/dL 211 (H) 172 (H) 287 (H) 309 (H) 314 (H)   Results for GRETTEL, RAMES (MRN [id number]) as of 08/04/2017 11:09  Ref. Range 08/01/2017 06:26  Hemoglobin A1C Latest Ref Range: 4.8 - 5.6 % 8.0 (H)   Review of Glycemic Control  Diabetes history: DM2 Outpatient Diabetes medications: Tresiba 25 units QHS Current orders for Inpatient glycemic control: Lantus 30 units daily, Novolog 5 units TID with meals, Novolog 0-20 units TID with meals, Novolog 0-5 units QHS; Solumedrol 60 mg Q12H  Inpatient Diabetes Program Recommendations: Insulin - Basal: If steroids are continued, please consider increasing Lantus to 35 units daily. Insulin - Meal Coverage: If steroids are continued as ordered, please consider increasing meal coverage to Novolog 10 units TID with meals. HgbA1C: A1C 8.0% on 08/01/17 indicating an average glucose of 183 mg/dl over the past 2-3 months.  NOTE: Solumedrol 60 mg Q12H is likely contributing to hyperglycemia. If Solumedrol is continued as ordered, recommend increasing Lantus and meal coverage.  Thanks, Barnie Alderman, RN, MSN, CDE Diabetes Coordinator Inpatient Diabetes Program [phone] (Team Pager from 8am to 5pm)

## 2017-08-04 NOTE — Progress Notes (Signed)
Discharge instructions given, verbalized understanding, out in stable condition with oxygen via w/c with staff.

## 2017-08-04 NOTE — Discharge Summary (Signed)
Physician Discharge Summary  Theresa [name]:[id number] DOB: 1966-03-21 DOA: 07/31/2017  PCP: Monico Blitz, MD  Admit date: 07/31/2017 Discharge date: 08/04/2017  Admitted From: Home Disposition: Home  Recommendations for Outpatient Follow-up:  1. Follow up with PCP in 1-2 weeks 2. Please obtain BMP/CBC in one week 3. Home health set up for you 4. Take prescriptions as prescribed 5. Smoking cessation encouraged   Home Health: Yes-PT/respiratory therapy Equipment/Devices: Rolling walker  Discharge Condition: Stable CODE STATUS:  full code Diet recommendation: Heart healthy, fluid restriction to 1500 mL, low-salt  Brief/Interim Summary: 51 year old female with a history of diastolic CHF, COPD, continued tobacco use, coronary artery disease status post CABG 2007, diabetes mellitus, chronic respiratory failure on 3-4 L at home, and lupus anticoagulant on chronic anticoagulation presented with 2-week history of shortness of breath that has worsened over the past 2 days prior to admission. She has had difficulty getting in and out of the shower because of shortness of breath. The patient states that she ran out of all her prescription medications and did not take any medications for approximately 1 week because of financial difficulty. She was finally able to get her prescriptions and restarted taking them on the day prior to Thanksgiving. Nevertheless, the patient has complained of increasing lower extremity edema and orthopnea type symptoms over the past 2-3 days. She has had to sleep on the couch propped up. She denies any fevers, chills, nausea, vomiting, hemoptysis, headache, neck pain. The patient has chronic abdominal pain and symptomatic cholelithiasis. She was previously told by her primary care provider to seek surgical care in a tertiary care center, but has yet to do so. Upon presentation, the patient was noted to be hypoxic with oxygen saturation 91%. The patient was  given furosemide IV and steroids.  Patient with diuresis during admission and was negative 6.7 L at time of discharge. Unfortunately at daily weights were not obtained. Patient was given IV steroids and was transitioned to by mouth steroids on day of discharge. Her respiratory status was stable and she was back on her home oxygen requirement of 3 L At time of Discharge. She was able to ambulate with minimal exertional dyspnea. She was seen by PT during her hospitalization at home health PT with a rolling walker with basket to carry her oxygen content was recommended. She was discharged in increased dose of Lasix and was instructed to follow-up her primary care physician within the next week to get electrolyte panel to follow kidney function.  Discharge Diagnoses:  Active Problems:   DM (diabetes mellitus) ([place])   Hyperlipidemia LDL goal <70   Essential hypertension   CAD, NATIVE VESSEL   Tobacco abuse   Respiratory failure (HCC)   Coronary artery disease involving coronary bypass graft of native heart without angina pectoris   COPD exacerbation (HCC)   Lupus anticoagulant disorder (HCC)   Acute respiratory failure with hypoxemia (HCC)   CHF (congestive heart failure) (HCC)   Dyspnea   Acute on chronic respiratory failure with hypoxia ([place])   Uncontrolled type 2 diabetes mellitus with hyperglycemia, with long-term current use of insulin (HCC)   Acute on chronic diastolic CHF (congestive heart failure) [facility])    Discharge Instructions  Discharge Instructions    (HEART FAILURE PATIENTS) Call MD:  Anytime you have any of the following symptoms: 1) 3 pound weight gain in 24 hours or 5 pounds in 1 week 2) shortness of breath, with or without a dry hacking cough 3) swelling in the  hands, feet or stomach 4) if you have to sleep on extra pillows at night in order to breathe.   Complete by:  As directed    Call MD for:  difficulty breathing, headache or visual disturbances   Complete by:  As  directed    Call MD for:  extreme fatigue   Complete by:  As directed    Call MD for:  persistant dizziness or light-headedness   Complete by:  As directed    Call MD for:  persistant nausea and vomiting   Complete by:  As directed    Call MD for:  severe uncontrolled pain   Complete by:  As directed    Call MD for:  temperature >100.4   Complete by:  As directed    Diet - low sodium heart healthy   Complete by:  As directed    Discharge instructions   Complete by:  As directed    Please schedule follow-up with your primary care physician in the next week and have a repeat electrolyte panel drawn at that time to ensure kidney function stability   Increase activity slowly   Complete by:  As directed      Allergies as of 08/04/2017      Reactions   Penicillins Rash   Has patient had a PCN reaction causing immediate rash, facial/tongue/throat swelling, SOB or lightheadedness with hypotension: Yes Has patient had a PCN reaction causing severe rash involving mucus membranes or skin necrosis: No Has patient had a PCN reaction that required hospitalization No Has patient had a PCN reaction occurring within the last 10 years: No If all of the above answers are "NO", then may proceed with Cephalosporin use. REACTION: rash      Medication List    STOP taking these medications   Tracheostomy Care Kit     TAKE these medications   albuterol 108 (90 Base) MCG/ACT inhaler Commonly known as:  PROVENTIL HFA;VENTOLIN HFA Inhale 1-2 puffs into the lungs every 6 (six) hours as needed for wheezing or shortness of breath.   albuterol (2.5 MG/3ML) 0.083% nebulizer solution Commonly known as:  PROVENTIL Take 3 mLs (2.5 mg total) by nebulization every 4 (four) hours as needed for wheezing or shortness of breath.   clopidogrel 75 MG tablet Commonly known as:  PLAVIX TAKE 1 TABLET BY MOUTH ONCE DAILY **NEED  OFFICE  VISIT** What changed:  See the new instructions.   furosemide 40 MG  tablet Commonly known as:  LASIX Take 1 tablet (40 mg total) by mouth 2 (two) times daily. What changed:    medication strength  when to take this  additional instructions   gabapentin 300 MG capsule Commonly known as:  NEURONTIN Take 300-600 mg by mouth 2 (two) times daily.   lisinopril 20 MG tablet Commonly known as:  PRINIVIL,ZESTRIL TAKE ONE TABLET BY MOUTH ONCE DAILY   metoprolol tartrate 50 MG tablet Commonly known as:  LOPRESSOR Take 50 mg by mouth 2 (two) times daily.   nitroGLYCERIN 0.4 MG SL tablet Commonly known as:  NITROSTAT Place 0.4 mg under the tongue every 5 (five) minutes as needed for chest pain.   pantoprazole 40 MG tablet Commonly known as:  PROTONIX Take 40 mg by mouth daily.   predniSONE 20 MG tablet Commonly known as:  DELTASONE Take 2 tablets (40 mg total) by mouth daily with breakfast for 2 days. Start taking on:  08/05/2017   traMADol 50 MG tablet Commonly known as:  Veatrice Bourbon  Take 1 tablet by mouth 2 (two) times daily.   TRESIBA FLEXTOUCH 100 UNIT/ML Sopn FlexTouch Pen Generic drug:  insulin degludec Inject 25 Units into the skin daily at 10 pm.   warfarin 5 MG tablet Commonly known as:  COUMADIN Take 2.5-5 mg by mouth See admin instructions. Pt takes 2.53m on Sundays - pt takes 562mall other days, MANAGED BY PMD            Durable Medical Equipment  (From admission, onward)        Start     Ordered   08/04/17 1244  For home use only DME Walker rolling  Once    Question:  Patient needs a walker to treat with the following condition  Answer:  Congestive heart failure (CHF) ([facility]  08/04/17 1243     Follow-up Information    ShMonico BlitzMD. Schedule an appointment as soon as possible for a visit in 1 week(s).   Specialty:  Internal Medicine Contact information: [id number] [address]   Contact information: [address] Specialty:  Family Medicine Contact information: [phone]          Allergies  Allergen Reactions  . Penicillins Rash    Has patient had a PCN reaction causing immediate rash, facial/tongue/throat swelling, SOB or lightheadedness with hypotension: Yes Has patient had a PCN reaction causing severe rash involving mucus membranes or skin necrosis: No Has patient had a PCN reaction that required hospitalization No Has patient had a PCN reaction occurring within the last 10 years: No If all of the above answers are "NO", then may proceed with Cephalosporin use.   REACTION: rash    Consultations:  None   Procedures/Studies: Ct Angio Chest Pe W/cm &/or Wo Cm  Result Date: 07/31/2017 CLINICAL DATA:  Dyspnea, cough and congestion with right upper quadrant pain since yesterday. EXAM: CT ANGIOGRAPHY CHEST CT ABDOMEN AND PELVIS WITH CONTRAST TECHNIQUE: Multidetector CT imaging of the chest was performed using the standard protocol during bolus administration of intravenous contrast. Multiplanar CT image reconstructions and MIPs were obtained to evaluate the vascular anatomy. Multidetector CT imaging of the abdomen and pelvis was performed using the standard protocol during bolus administration of intravenous contrast. CONTRAST:  100 cc Isovue 370 IV COMPARISON:  CXR 10/27/2016 and 07/31/2017, chest CT 01/23/2016 FINDINGS: CTA CHEST FINDINGS Cardiovascular: There is cardiomegaly without pericardial effusion. No acute pulmonary embolus. Three-vessel coronary arteriosclerosis is noted with post CABG change. Normal caliber to aorta with minimal aortic atherosclerosis. No dissection. Mediastinum/Nodes: Right upper paratracheal 2.5 cm short axis and prevascular 1.5 cm short axis lymphadenopathy with smaller prevascular and paratracheal lymph nodes present. Subcarinal lymphadenopathy the 2.1 cm short axis. Mild  hilar lymphadenopathy is also noted more so on the right. Tracheostomy tube is noted with tip in the upper trachea. The mainstem bronchi are patent. No esophageal abnormality. Lungs/Pleura: Interlobular septal thickening with diffuse bilateral upper lobe predominant ground-glass opacities consistent stigmata of CHF. Streaky atelectasis and/or scarring is seen predominantly in the lower lobes. Small amount of fluid in the right minor and major fissures. Peribronchial thickening is also noted to the lower lobes, right middle lobe and lingula consistent with chronic bronchitic change. Musculoskeletal: Degenerative changes are seen along the dorsal spine.  No acute nor suspicious osseous lesions. Review of the MIP images confirms the above findings. CT ABDOMEN and PELVIS FINDINGS Hepatobiliary: Hepatic steatosis without focal mass or biliary dilatation. 8 mm gallstone near the neck of the gallbladder without secondary signs of acute cholecystitis. Pancreas: Normal Spleen: Normal size spleen without mass. Adrenals/Urinary Tract: Lobular, low-density left adrenal mass measuring 3.7 x 2.4 cm on series 12 image 21, is stable in appearance. Based on Hounsfield units, findings would be in keeping with an adenoma. No right adrenal mass. No obstructive uropathy. Mild right lower pole cortical irregularity suspicious for scarring. No obstructive uropathy. Nondistended urinary bladder. Stomach/Bowel: Physiologic distention of the stomach. Normal small bowel rotation without inflammation or obstruction. Mild submucosal fatty change of the cecum and ascending colon that may reflect stigmata of chronic inflammatory bowel. Normal-appearing appendix. Scattered diverticulosis along the distal descending and sigmoid colon without acute diverticulitis. Vascular/Lymphatic: Aortoiliac and branch vessel atherosclerosis without aneurysm. No lymphadenopathy by CT size criteria. Reproductive: Uterus and bilateral adnexa are unremarkable. Other:  Soft tissue anasarca.  No ascites or free air. Musculoskeletal: No aggressive lytic or blastic disease. Mild degenerative change along the lower thoracic spine. No acute nor suspicious osseous abnormality Review of the MIP images confirms the above findings. IMPRESSION: 1. The patient is status post CABG. There is stable cardiomegaly with coronary arteriosclerosis re- demonstrated. 2. Nonspecific mediastinal and right hilar lymphadenopathy slightly increased in prominence along the aortic arch and subcarinal portion since prior question chronic reactive adenopathy. 3. No acute pulmonary embolus. 4. Stigmata of CHF with interlobular septal thickening and ground-glass opacities predominantly in the upper lobes. Chronic bronchitic change of the lungs with peribronchial thickening re-demonstrated and diffuse scattered subsegmental areas of atelectasis at the lung bases. 5. Hepatic steatosis. 6. Uncomplicated cholelithiasis. 7. Stable lobular hypodense left adrenal mass consistent with an adenoma measuring 3.7 x 2.4 cm currently. 8. Colonic diverticulosis.  No acute diverticulitis. 9. Mild soft tissue anasarca. Electronically Signed   By: Ashley Royalty M.D.   On: 07/31/2017 17:59   Ct Abdomen Pelvis W Contrast  Result Date: 07/31/2017 CLINICAL DATA:  Dyspnea, cough and congestion with right upper quadrant pain since yesterday. EXAM: CT ANGIOGRAPHY CHEST CT ABDOMEN AND PELVIS WITH CONTRAST TECHNIQUE: Multidetector CT imaging of the chest was performed using the standard protocol during bolus administration of intravenous contrast. Multiplanar CT image reconstructions and MIPs were obtained to evaluate the vascular anatomy. Multidetector CT imaging of the abdomen and pelvis was performed using the standard protocol during bolus administration of intravenous contrast. CONTRAST:  100 cc Isovue 370 IV COMPARISON:  CXR 10/27/2016 and 07/31/2017, chest CT 01/23/2016 FINDINGS: CTA CHEST FINDINGS Cardiovascular: There is  cardiomegaly without pericardial effusion. No acute pulmonary embolus. Three-vessel coronary arteriosclerosis is noted with post CABG change. Normal caliber to aorta with minimal aortic atherosclerosis. No dissection. Mediastinum/Nodes: Right upper paratracheal 2.5 cm short axis and prevascular 1.5 cm short axis lymphadenopathy with smaller prevascular and paratracheal lymph nodes present. Subcarinal lymphadenopathy the 2.1 cm short axis. Mild hilar lymphadenopathy is also noted more so on the right. Tracheostomy tube is noted with tip in the upper trachea. The mainstem bronchi are patent. No esophageal abnormality. Lungs/Pleura: Interlobular septal thickening with diffuse bilateral upper lobe predominant ground-glass opacities consistent stigmata of CHF. Streaky atelectasis and/or scarring is seen predominantly in the lower lobes. Small amount of fluid in the right minor and major fissures. Peribronchial thickening is also noted to the lower lobes, right middle lobe and lingula consistent with chronic bronchitic  change. Musculoskeletal: Degenerative changes are seen along the dorsal spine. No acute nor suspicious osseous lesions. Review of the MIP images confirms the above findings. CT ABDOMEN and PELVIS FINDINGS Hepatobiliary: Hepatic steatosis without focal mass or biliary dilatation. 8 mm gallstone near the neck of the gallbladder without secondary signs of acute cholecystitis. Pancreas: Normal Spleen: Normal size spleen without mass. Adrenals/Urinary Tract: Lobular, low-density left adrenal mass measuring 3.7 x 2.4 cm on series 12 image 21, is stable in appearance. Based on Hounsfield units, findings would be in keeping with an adenoma. No right adrenal mass. No obstructive uropathy. Mild right lower pole cortical irregularity suspicious for scarring. No obstructive uropathy. Nondistended urinary bladder. Stomach/Bowel: Physiologic distention of the stomach. Normal small bowel rotation without inflammation or  obstruction. Mild submucosal fatty change of the cecum and ascending colon that may reflect stigmata of chronic inflammatory bowel. Normal-appearing appendix. Scattered diverticulosis along the distal descending and sigmoid colon without acute diverticulitis. Vascular/Lymphatic: Aortoiliac and branch vessel atherosclerosis without aneurysm. No lymphadenopathy by CT size criteria. Reproductive: Uterus and bilateral adnexa are unremarkable. Other: Soft tissue anasarca.  No ascites or free air. Musculoskeletal: No aggressive lytic or blastic disease. Mild degenerative change along the lower thoracic spine. No acute nor suspicious osseous abnormality Review of the MIP images confirms the above findings. IMPRESSION: 1. The patient is status post CABG. There is stable cardiomegaly with coronary arteriosclerosis re- demonstrated. 2. Nonspecific mediastinal and right hilar lymphadenopathy slightly increased in prominence along the aortic arch and subcarinal portion since prior question chronic reactive adenopathy. 3. No acute pulmonary embolus. 4. Stigmata of CHF with interlobular septal thickening and ground-glass opacities predominantly in the upper lobes. Chronic bronchitic change of the lungs with peribronchial thickening re-demonstrated and diffuse scattered subsegmental areas of atelectasis at the lung bases. 5. Hepatic steatosis. 6. Uncomplicated cholelithiasis. 7. Stable lobular hypodense left adrenal mass consistent with an adenoma measuring 3.7 x 2.4 cm currently. 8. Colonic diverticulosis.  No acute diverticulitis. 9. Mild soft tissue anasarca. Electronically Signed   By: Ashley Royalty M.D.   On: 07/31/2017 17:59   Dg Chest Port 1 View  Result Date: 07/31/2017 CLINICAL DATA:  Shortness of breath, cough. EXAM: PORTABLE CHEST 1 VIEW COMPARISON:  Radiograph of October 27, 2016. FINDINGS: Stable cardiomegaly and central pulmonary vascular congestion. Tracheostomy is in grossly good position. Sternotomy wires are  noted. Probable bilateral pulmonary edema is noted. Fluid is noted in the right minor fissure. Bony thorax is unremarkable. Minimal bilateral pleural effusions are noted. IMPRESSION: Stable cardiomegaly and central pulmonary vascular congestion. Mild bilateral pulmonary edema is noted with minimal pleural effusions. Electronically Signed   By: Marijo Conception, M.D.   On: 07/31/2017 15:18       Subjective: Patient seen and evaluated.  She reports she feels slightly better in terms of fatigue but her respiratory status feels about the same.  She reports she has been able to ambulate to and from the restroom and is back to her home oxygen requirement.  Discharge Exam: Vitals:   08/04/17 0832 08/04/17 0834  BP:    Pulse:    Resp:    Temp:    SpO2: 93% 96%   Vitals:   08/04/17 0500 08/04/17 0613 08/04/17 0832 08/04/17 0834  BP:      Pulse:      Resp:      Temp:      TempSrc:      SpO2:   93% 96%  Weight: 101 kg (222 lb  10.6 oz) 97.8 kg (215 lb 8 oz)    Height:        General: Pt is alert, awake, not in acute distress Cardiovascular: RRR, S1/S2 +, no rubs, no gallops Respiratory: end expiratory wheezing but no increase in respiratory effort or accessory muscle use, trach Abdominal: Soft, obese, NT, ND, bowel sounds + Extremities: edema minimal to the mid shin bilaterally, no cyanosis    The results of significant diagnostics from this hospitalization (including imaging, microbiology, ancillary and laboratory) are listed below for reference.     Microbiology: No results found for this or any previous visit (from the past 240 hour(s)).   Labs: BNP (last 3 results) Recent Labs    07/31/17 1427  BNP 2,244.9*   Basic Metabolic Panel: Recent Labs  Lab 07/31/17 1427 08/01/17 0626 08/02/17 0413 08/03/17 0412 08/04/17 0434  NA 139 133* 133* 135 135  K 3.9 4.1 4.9 5.0 4.4  CL 99* 94* 90* 89* 91*  CO2 31 28 32 38* 35*  GLUCOSE 239* 436* 365* 207* 368*  BUN 11 14 21* 26*  29*  CREATININE 0.93 0.92 1.00 1.03* 1.01*  CALCIUM 9.2 8.6* 8.8* 8.9 8.6*  MG  --  1.6* 1.7 2.3  --    Liver Function Tests: Recent Labs  Lab 07/31/17 1427 08/01/17 0626 08/02/17 0413  AST 17 19 15   ALT 12* 14 13*  ALKPHOS 100 98 95  BILITOT 1.7* 1.3*  1.4* 1.2  PROT 7.1 7.2 7.0  ALBUMIN 3.2* 3.2* 3.3*   Recent Labs  Lab 07/31/17 1427  LIPASE 22   No results for input(s): AMMONIA in the last 168 hours. CBC: Recent Labs  Lab 07/31/17 1427 08/01/17 0626 08/02/17 0413 08/03/17 0412 08/04/17 0434  WBC 6.9 7.0 13.3* 17.8* 12.2*  NEUTROABS 5.3 6.3 12.3* 15.7* 10.7*  HGB 13.5 13.3 13.2 13.4 13.3  HCT 44.0 43.8 44.2 45.6 45.1  MCV 98.0 98.2 99.8 100.9* 99.6  PLT 194 198 208 230 206   Cardiac Enzymes: Recent Labs  Lab 07/31/17 1427  TROPONINI <0.03   BNP: Invalid input(s): POCBNP CBG: Recent Labs  Lab 08/03/17 1149 08/03/17 1641 08/03/17 2125 08/04/17 0715 08/04/17 1116  GLUCAP 172* 287* 309* 314* 252*   D-Dimer No results for input(s): DDIMER in the last 72 hours. Hgb A1c No results for input(s): HGBA1C in the last 72 hours. Lipid Profile No results for input(s): CHOL, HDL, LDLCALC, TRIG, CHOLHDL, LDLDIRECT in the last 72 hours. Thyroid function studies No results for input(s): TSH, T4TOTAL, T3FREE, THYROIDAB in the last 72 hours.  Invalid input(s): FREET3 Anemia work up No results for input(s): VITAMINB12, FOLATE, FERRITIN, TIBC, IRON, RETICCTPCT in the last 72 hours. Urinalysis    Component Value Date/Time   COLORURINE AMBER (A) 07/31/2017 1810   APPEARANCEUR HAZY (A) 07/31/2017 1810   LABSPEC >1.046 (H) 07/31/2017 1810   PHURINE 5.0 07/31/2017 1810   GLUCOSEU 50 (A) 07/31/2017 1810   HGBUR SMALL (A) 07/31/2017 1810   BILIRUBINUR NEGATIVE 07/31/2017 1810   KETONESUR NEGATIVE 07/31/2017 1810   PROTEINUR >=300 (A) 07/31/2017 1810   NITRITE NEGATIVE 07/31/2017 1810   LEUKOCYTESUR NEGATIVE 07/31/2017 1810   Sepsis Labs Invalid input(s):  PROCALCITONIN,  WBC,  LACTICIDVEN Microbiology No results found for this or any previous visit (from the past 240 hour(s)).   Time coordinating discharge: Over 30 minutes  SIGNED:   Loretha Stapler, MD  [facility] 08/04/2017, 1:02 PM Pager [phone] If 7PM-7AM, please contact night-coverage [email or web address] Password [email or web address]

## 2017-08-04 NOTE — Care Management Important Message (Signed)
Important Message  Patient Details  Name: MIKHALA KENAN MRN: [id number] Date of Birth: [birth year]   Medicare Important Message Given:  Yes    Norell Brisbin, Chauncey Reading, RN 08/04/2017, 12:54 PM

## 2017-08-04 NOTE — Progress Notes (Signed)
[place] for Warfarin (home med) Indication: lupus anticoagulant d/o  Allergies  Allergen Reactions  . Penicillins Rash    Has patient had a PCN reaction causing immediate rash, facial/tongue/throat swelling, SOB or lightheadedness with hypotension: Yes Has patient had a PCN reaction causing severe rash involving mucus membranes or skin necrosis: No Has patient had a PCN reaction that required hospitalization No Has patient had a PCN reaction occurring within the last 10 years: No If all of the above answers are "NO", then may proceed with Cephalosporin use.   REACTION: rash    Patient Measurements: Height: 4' 9"  (144.8 cm) Weight: 215 lb 8 oz (97.8 kg) IBW/kg (Calculated) : 38.6  Vital Signs: Temp: 97.2 F (36.2 C) (11/29 0435) Temp Source: Oral (11/29 0435) BP: 138/88 (11/29 0435) Pulse Rate: 56 (11/29 0435)  Labs: Recent Labs    08/02/17 0413 08/03/17 0412 08/04/17 0434  HGB 13.2 13.4 13.3  HCT 44.2 45.6 45.1  PLT 208 230 206  LABPROT 32.2* 31.5* 23.3*  INR 3.16 3.07 2.09  CREATININE 1.00 1.03* 1.01*   Estimated Creatinine Clearance: 64.8 mL/min (A) (by C-G formula based on SCr of 1.01 mg/dL (H)).  Medical History: Past Medical History:  Diagnosis Date  . CAD (coronary artery disease)    s/p 2V CABG in 2007  . CAD (coronary artery disease) of artery bypass graft 11/03/2015  . Cervical cancer ([place])   . CHF (congestive heart failure) ([place])   . Chronic RUQ pain   . COPD (chronic obstructive pulmonary disease) ([place])   . Diabetes mellitus ([place])   . Endometriosis   . History of gallstones 01/2016   seen on Ultrasound  . History of HIDA scan 11/2016   normal  . HTN (hypertension)   . Hyperlipidemia   . Lupus anticoagulant disorder (HCC)    on coumadin  . Respiratory failure ([place])    s/p tracheostomy 2002  . ST elevation (STEMI) myocardial infarction involving right coronary artery ([place]) 10/29/15   stent to VG to PDA   . Tracheostomy in place [facility]), chronic since 2002 11/03/2015   Medications:  Medications Prior to Admission  Medication Sig Dispense Refill Last Dose  . albuterol (PROVENTIL HFA;VENTOLIN HFA) 108 (90 Base) MCG/ACT inhaler Inhale 1-2 puffs into the lungs every 6 (six) hours as needed for wheezing or shortness of breath.   07/31/2017 at Unknown time  . albuterol (PROVENTIL) (2.5 MG/3ML) 0.083% nebulizer solution Take 3 mLs (2.5 mg total) by nebulization every 4 (four) hours as needed for wheezing or shortness of breath. 75 mL 12 unknown  . clopidogrel (PLAVIX) 75 MG tablet TAKE 1 TABLET BY MOUTH ONCE DAILY **NEED  OFFICE  VISIT** (Patient taking differently: TAKE 1 TABLET BY MOUTH ONCE DAILY) 90 tablet 3 07/30/2017 at Unknown time  . furosemide (LASIX) 20 MG tablet Take 40 mg by mouth every morning. & extra as needed   07/30/2017 at Unknown time  . gabapentin (NEURONTIN) 300 MG capsule Take 300-600 mg by mouth 2 (two) times daily.   07/30/2017 at Unknown time  . insulin degludec (TRESIBA FLEXTOUCH) 100 UNIT/ML SOPN FlexTouch Pen Inject 25 Units into the skin daily at 10 pm.   07/30/2017 at Unknown time  . lisinopril (PRINIVIL,ZESTRIL) 20 MG tablet TAKE ONE TABLET BY MOUTH ONCE DAILY 90 tablet 1 07/30/2017 at Unknown time  . metoprolol (LOPRESSOR) 50 MG tablet Take 50 mg by mouth 2 (two) times daily.   07/30/2017 at 2130          .  nitroGLYCERIN (NITROSTAT) 0.4 MG SL tablet Place 0.4 mg under the tongue every 5 (five) minutes as needed for chest pain.   unknown  . pantoprazole (PROTONIX) 40 MG tablet Take 40 mg by mouth daily.   07/30/2017 at Unknown time  . traMADol (ULTRAM) 50 MG tablet Take 1 tablet by mouth 2 (two) times daily.   07/30/2017 at Unknown time  . warfarin (COUMADIN) 5 MG tablet Take 2.5-5 mg by mouth See admin instructions. Pt takes 2.7m on Sundays - pt takes 55mall other days, MANAGED BY PMD   07/30/2017 at 2130  . Tracheostomy Care KIT 1 kit by Does not apply route daily. (Patient  not taking: Reported on 07/31/2017) 30 each 6 Completed Course    Assessment: [age]o female on chronic coumadin.  INR 2.09  . No bleeding reported  Goal of Therapy:  INR 2-3 Monitor platelets by anticoagulation protocol: Yes   Plan:  Coumadin 5 mg today INR daily  Nadezhda Pollitt [place] 08/04/2017,10:17 AM

## 2017-08-08 DIAGNOSIS — Z930 Tracheostomy status: Secondary | ICD-10-CM | POA: Diagnosis not present

## 2017-08-08 DIAGNOSIS — D6862 Lupus anticoagulant syndrome: Secondary | ICD-10-CM | POA: Diagnosis not present

## 2017-08-08 DIAGNOSIS — Z7902 Long term (current) use of antithrombotics/antiplatelets: Secondary | ICD-10-CM | POA: Diagnosis not present

## 2017-08-08 DIAGNOSIS — J441 Chronic obstructive pulmonary disease with (acute) exacerbation: Secondary | ICD-10-CM | POA: Diagnosis not present

## 2017-08-08 DIAGNOSIS — Z951 Presence of aortocoronary bypass graft: Secondary | ICD-10-CM | POA: Diagnosis not present

## 2017-08-08 DIAGNOSIS — I5033 Acute on chronic diastolic (congestive) heart failure: Secondary | ICD-10-CM | POA: Diagnosis not present

## 2017-08-08 DIAGNOSIS — E1165 Type 2 diabetes mellitus with hyperglycemia: Secondary | ICD-10-CM | POA: Diagnosis not present

## 2017-08-08 DIAGNOSIS — J9621 Acute and chronic respiratory failure with hypoxia: Secondary | ICD-10-CM | POA: Diagnosis not present

## 2017-08-08 DIAGNOSIS — F17210 Nicotine dependence, cigarettes, uncomplicated: Secondary | ICD-10-CM | POA: Diagnosis not present

## 2017-08-08 DIAGNOSIS — I110 Hypertensive heart disease with heart failure: Secondary | ICD-10-CM | POA: Diagnosis not present

## 2017-08-08 DIAGNOSIS — E785 Hyperlipidemia, unspecified: Secondary | ICD-10-CM | POA: Diagnosis not present

## 2017-08-08 DIAGNOSIS — Z9181 History of falling: Secondary | ICD-10-CM | POA: Diagnosis not present

## 2017-08-08 DIAGNOSIS — I25810 Atherosclerosis of coronary artery bypass graft(s) without angina pectoris: Secondary | ICD-10-CM | POA: Diagnosis not present

## 2017-08-08 DIAGNOSIS — I2510 Atherosclerotic heart disease of native coronary artery without angina pectoris: Secondary | ICD-10-CM | POA: Diagnosis not present

## 2017-08-08 DIAGNOSIS — Z7901 Long term (current) use of anticoagulants: Secondary | ICD-10-CM | POA: Diagnosis not present

## 2017-08-08 DIAGNOSIS — Z9981 Dependence on supplemental oxygen: Secondary | ICD-10-CM | POA: Diagnosis not present

## 2017-08-10 DIAGNOSIS — J441 Chronic obstructive pulmonary disease with (acute) exacerbation: Secondary | ICD-10-CM | POA: Diagnosis not present

## 2017-08-10 DIAGNOSIS — I5033 Acute on chronic diastolic (congestive) heart failure: Secondary | ICD-10-CM | POA: Diagnosis not present

## 2017-08-10 DIAGNOSIS — I25810 Atherosclerosis of coronary artery bypass graft(s) without angina pectoris: Secondary | ICD-10-CM | POA: Diagnosis not present

## 2017-08-10 DIAGNOSIS — I110 Hypertensive heart disease with heart failure: Secondary | ICD-10-CM | POA: Diagnosis not present

## 2017-08-10 DIAGNOSIS — E1165 Type 2 diabetes mellitus with hyperglycemia: Secondary | ICD-10-CM | POA: Diagnosis not present

## 2017-08-10 DIAGNOSIS — I2510 Atherosclerotic heart disease of native coronary artery without angina pectoris: Secondary | ICD-10-CM | POA: Diagnosis not present

## 2017-08-12 DIAGNOSIS — E1165 Type 2 diabetes mellitus with hyperglycemia: Secondary | ICD-10-CM | POA: Diagnosis not present

## 2017-08-12 DIAGNOSIS — I110 Hypertensive heart disease with heart failure: Secondary | ICD-10-CM | POA: Diagnosis not present

## 2017-08-12 DIAGNOSIS — I25810 Atherosclerosis of coronary artery bypass graft(s) without angina pectoris: Secondary | ICD-10-CM | POA: Diagnosis not present

## 2017-08-12 DIAGNOSIS — J441 Chronic obstructive pulmonary disease with (acute) exacerbation: Secondary | ICD-10-CM | POA: Diagnosis not present

## 2017-08-12 DIAGNOSIS — I2510 Atherosclerotic heart disease of native coronary artery without angina pectoris: Secondary | ICD-10-CM | POA: Diagnosis not present

## 2017-08-12 DIAGNOSIS — I5033 Acute on chronic diastolic (congestive) heart failure: Secondary | ICD-10-CM | POA: Diagnosis not present

## 2017-08-16 DIAGNOSIS — I110 Hypertensive heart disease with heart failure: Secondary | ICD-10-CM | POA: Diagnosis not present

## 2017-08-16 DIAGNOSIS — I25810 Atherosclerosis of coronary artery bypass graft(s) without angina pectoris: Secondary | ICD-10-CM | POA: Diagnosis not present

## 2017-08-16 DIAGNOSIS — J441 Chronic obstructive pulmonary disease with (acute) exacerbation: Secondary | ICD-10-CM | POA: Diagnosis not present

## 2017-08-16 DIAGNOSIS — I5033 Acute on chronic diastolic (congestive) heart failure: Secondary | ICD-10-CM | POA: Diagnosis not present

## 2017-08-16 DIAGNOSIS — E1165 Type 2 diabetes mellitus with hyperglycemia: Secondary | ICD-10-CM | POA: Diagnosis not present

## 2017-08-16 DIAGNOSIS — I2510 Atherosclerotic heart disease of native coronary artery without angina pectoris: Secondary | ICD-10-CM | POA: Diagnosis not present

## 2017-08-18 DIAGNOSIS — I5030 Unspecified diastolic (congestive) heart failure: Secondary | ICD-10-CM | POA: Diagnosis not present

## 2017-08-18 DIAGNOSIS — I82409 Acute embolism and thrombosis of unspecified deep veins of unspecified lower extremity: Secondary | ICD-10-CM | POA: Diagnosis not present

## 2017-08-18 DIAGNOSIS — I10 Essential (primary) hypertension: Secondary | ICD-10-CM | POA: Diagnosis not present

## 2017-08-18 DIAGNOSIS — Z6841 Body Mass Index (BMI) 40.0 and over, adult: Secondary | ICD-10-CM | POA: Diagnosis not present

## 2017-08-18 DIAGNOSIS — I80209 Phlebitis and thrombophlebitis of unspecified deep vessels of unspecified lower extremity: Secondary | ICD-10-CM | POA: Diagnosis not present

## 2017-08-18 DIAGNOSIS — Z299 Encounter for prophylactic measures, unspecified: Secondary | ICD-10-CM | POA: Diagnosis not present

## 2017-08-18 DIAGNOSIS — J449 Chronic obstructive pulmonary disease, unspecified: Secondary | ICD-10-CM | POA: Diagnosis not present

## 2017-08-18 DIAGNOSIS — E1165 Type 2 diabetes mellitus with hyperglycemia: Secondary | ICD-10-CM | POA: Diagnosis not present

## 2017-08-18 DIAGNOSIS — E1142 Type 2 diabetes mellitus with diabetic polyneuropathy: Secondary | ICD-10-CM | POA: Diagnosis not present

## 2017-08-19 DIAGNOSIS — E119 Type 2 diabetes mellitus without complications: Secondary | ICD-10-CM | POA: Diagnosis not present

## 2017-08-19 DIAGNOSIS — I5033 Acute on chronic diastolic (congestive) heart failure: Secondary | ICD-10-CM | POA: Diagnosis not present

## 2017-08-19 DIAGNOSIS — I25810 Atherosclerosis of coronary artery bypass graft(s) without angina pectoris: Secondary | ICD-10-CM | POA: Diagnosis not present

## 2017-08-19 DIAGNOSIS — I2510 Atherosclerotic heart disease of native coronary artery without angina pectoris: Secondary | ICD-10-CM | POA: Diagnosis not present

## 2017-08-19 DIAGNOSIS — J441 Chronic obstructive pulmonary disease with (acute) exacerbation: Secondary | ICD-10-CM | POA: Diagnosis not present

## 2017-08-19 DIAGNOSIS — I110 Hypertensive heart disease with heart failure: Secondary | ICD-10-CM | POA: Diagnosis not present

## 2017-08-19 DIAGNOSIS — I10 Essential (primary) hypertension: Secondary | ICD-10-CM | POA: Diagnosis not present

## 2017-08-19 DIAGNOSIS — E1165 Type 2 diabetes mellitus with hyperglycemia: Secondary | ICD-10-CM | POA: Diagnosis not present

## 2017-08-19 DIAGNOSIS — M159 Polyosteoarthritis, unspecified: Secondary | ICD-10-CM | POA: Diagnosis not present

## 2017-08-22 DIAGNOSIS — I110 Hypertensive heart disease with heart failure: Secondary | ICD-10-CM | POA: Diagnosis not present

## 2017-08-22 DIAGNOSIS — J441 Chronic obstructive pulmonary disease with (acute) exacerbation: Secondary | ICD-10-CM | POA: Diagnosis not present

## 2017-08-22 DIAGNOSIS — I25810 Atherosclerosis of coronary artery bypass graft(s) without angina pectoris: Secondary | ICD-10-CM | POA: Diagnosis not present

## 2017-08-22 DIAGNOSIS — I2510 Atherosclerotic heart disease of native coronary artery without angina pectoris: Secondary | ICD-10-CM | POA: Diagnosis not present

## 2017-08-22 DIAGNOSIS — E1165 Type 2 diabetes mellitus with hyperglycemia: Secondary | ICD-10-CM | POA: Diagnosis not present

## 2017-08-22 DIAGNOSIS — I5033 Acute on chronic diastolic (congestive) heart failure: Secondary | ICD-10-CM | POA: Diagnosis not present

## 2017-08-25 DIAGNOSIS — I110 Hypertensive heart disease with heart failure: Secondary | ICD-10-CM | POA: Diagnosis not present

## 2017-08-25 DIAGNOSIS — E1165 Type 2 diabetes mellitus with hyperglycemia: Secondary | ICD-10-CM | POA: Diagnosis not present

## 2017-08-25 DIAGNOSIS — I2510 Atherosclerotic heart disease of native coronary artery without angina pectoris: Secondary | ICD-10-CM | POA: Diagnosis not present

## 2017-08-25 DIAGNOSIS — I5033 Acute on chronic diastolic (congestive) heart failure: Secondary | ICD-10-CM | POA: Diagnosis not present

## 2017-08-25 DIAGNOSIS — I25810 Atherosclerosis of coronary artery bypass graft(s) without angina pectoris: Secondary | ICD-10-CM | POA: Diagnosis not present

## 2017-08-25 DIAGNOSIS — J441 Chronic obstructive pulmonary disease with (acute) exacerbation: Secondary | ICD-10-CM | POA: Diagnosis not present

## 2017-08-31 DIAGNOSIS — I25810 Atherosclerosis of coronary artery bypass graft(s) without angina pectoris: Secondary | ICD-10-CM | POA: Diagnosis not present

## 2017-08-31 DIAGNOSIS — J441 Chronic obstructive pulmonary disease with (acute) exacerbation: Secondary | ICD-10-CM | POA: Diagnosis not present

## 2017-08-31 DIAGNOSIS — I110 Hypertensive heart disease with heart failure: Secondary | ICD-10-CM | POA: Diagnosis not present

## 2017-08-31 DIAGNOSIS — E1165 Type 2 diabetes mellitus with hyperglycemia: Secondary | ICD-10-CM | POA: Diagnosis not present

## 2017-08-31 DIAGNOSIS — I2510 Atherosclerotic heart disease of native coronary artery without angina pectoris: Secondary | ICD-10-CM | POA: Diagnosis not present

## 2017-08-31 DIAGNOSIS — I5033 Acute on chronic diastolic (congestive) heart failure: Secondary | ICD-10-CM | POA: Diagnosis not present

## 2017-10-09 DIAGNOSIS — I2510 Atherosclerotic heart disease of native coronary artery without angina pectoris: Secondary | ICD-10-CM | POA: Diagnosis present

## 2017-10-09 DIAGNOSIS — J9690 Respiratory failure, unspecified, unspecified whether with hypoxia or hypercapnia: Secondary | ICD-10-CM | POA: Diagnosis not present

## 2017-10-09 DIAGNOSIS — Z7902 Long term (current) use of antithrombotics/antiplatelets: Secondary | ICD-10-CM | POA: Diagnosis not present

## 2017-10-09 DIAGNOSIS — Z955 Presence of coronary angioplasty implant and graft: Secondary | ICD-10-CM | POA: Diagnosis not present

## 2017-10-09 DIAGNOSIS — I509 Heart failure, unspecified: Secondary | ICD-10-CM | POA: Diagnosis not present

## 2017-10-09 DIAGNOSIS — E119 Type 2 diabetes mellitus without complications: Secondary | ICD-10-CM | POA: Diagnosis not present

## 2017-10-09 DIAGNOSIS — E7800 Pure hypercholesterolemia, unspecified: Secondary | ICD-10-CM | POA: Diagnosis present

## 2017-10-09 DIAGNOSIS — J440 Chronic obstructive pulmonary disease with acute lower respiratory infection: Secondary | ICD-10-CM | POA: Diagnosis present

## 2017-10-09 DIAGNOSIS — I110 Hypertensive heart disease with heart failure: Secondary | ICD-10-CM | POA: Diagnosis present

## 2017-10-09 DIAGNOSIS — Z7901 Long term (current) use of anticoagulants: Secondary | ICD-10-CM | POA: Diagnosis not present

## 2017-10-09 DIAGNOSIS — R0902 Hypoxemia: Secondary | ICD-10-CM | POA: Diagnosis not present

## 2017-10-09 DIAGNOSIS — E669 Obesity, unspecified: Secondary | ICD-10-CM | POA: Diagnosis present

## 2017-10-09 DIAGNOSIS — Z930 Tracheostomy status: Secondary | ICD-10-CM | POA: Diagnosis not present

## 2017-10-09 DIAGNOSIS — I5023 Acute on chronic systolic (congestive) heart failure: Secondary | ICD-10-CM | POA: Diagnosis not present

## 2017-10-09 DIAGNOSIS — J181 Lobar pneumonia, unspecified organism: Secondary | ICD-10-CM | POA: Diagnosis not present

## 2017-10-09 DIAGNOSIS — J189 Pneumonia, unspecified organism: Secondary | ICD-10-CM | POA: Diagnosis present

## 2017-10-09 DIAGNOSIS — Z720 Tobacco use: Secondary | ICD-10-CM | POA: Diagnosis not present

## 2017-10-09 DIAGNOSIS — Z951 Presence of aortocoronary bypass graft: Secondary | ICD-10-CM | POA: Diagnosis not present

## 2017-10-09 DIAGNOSIS — I252 Old myocardial infarction: Secondary | ICD-10-CM | POA: Diagnosis not present

## 2017-10-09 DIAGNOSIS — Z6841 Body Mass Index (BMI) 40.0 and over, adult: Secondary | ICD-10-CM | POA: Diagnosis not present

## 2017-10-09 DIAGNOSIS — Z86718 Personal history of other venous thrombosis and embolism: Secondary | ICD-10-CM | POA: Diagnosis not present

## 2017-10-09 DIAGNOSIS — Z79899 Other long term (current) drug therapy: Secondary | ICD-10-CM | POA: Diagnosis not present

## 2017-10-14 DIAGNOSIS — I2510 Atherosclerotic heart disease of native coronary artery without angina pectoris: Secondary | ICD-10-CM | POA: Diagnosis not present

## 2017-10-14 DIAGNOSIS — Z79899 Other long term (current) drug therapy: Secondary | ICD-10-CM | POA: Diagnosis not present

## 2017-10-14 DIAGNOSIS — Z9981 Dependence on supplemental oxygen: Secondary | ICD-10-CM | POA: Diagnosis not present

## 2017-10-14 DIAGNOSIS — J441 Chronic obstructive pulmonary disease with (acute) exacerbation: Secondary | ICD-10-CM | POA: Diagnosis not present

## 2017-10-14 DIAGNOSIS — Z8781 Personal history of (healed) traumatic fracture: Secondary | ICD-10-CM | POA: Diagnosis not present

## 2017-10-14 DIAGNOSIS — J440 Chronic obstructive pulmonary disease with acute lower respiratory infection: Secondary | ICD-10-CM | POA: Diagnosis not present

## 2017-10-14 DIAGNOSIS — Z794 Long term (current) use of insulin: Secondary | ICD-10-CM | POA: Diagnosis not present

## 2017-10-14 DIAGNOSIS — Z720 Tobacco use: Secondary | ICD-10-CM | POA: Diagnosis not present

## 2017-10-14 DIAGNOSIS — J181 Lobar pneumonia, unspecified organism: Secondary | ICD-10-CM | POA: Diagnosis not present

## 2017-10-14 DIAGNOSIS — Z930 Tracheostomy status: Secondary | ICD-10-CM | POA: Diagnosis not present

## 2017-10-14 DIAGNOSIS — I5023 Acute on chronic systolic (congestive) heart failure: Secondary | ICD-10-CM | POA: Diagnosis not present

## 2017-10-14 DIAGNOSIS — Z86718 Personal history of other venous thrombosis and embolism: Secondary | ICD-10-CM | POA: Diagnosis not present

## 2017-10-14 DIAGNOSIS — Z955 Presence of coronary angioplasty implant and graft: Secondary | ICD-10-CM | POA: Diagnosis not present

## 2017-10-14 DIAGNOSIS — J3800 Paralysis of vocal cords and larynx, unspecified: Secondary | ICD-10-CM | POA: Diagnosis not present

## 2017-10-14 DIAGNOSIS — I110 Hypertensive heart disease with heart failure: Secondary | ICD-10-CM | POA: Diagnosis not present

## 2017-10-14 DIAGNOSIS — Z7902 Long term (current) use of antithrombotics/antiplatelets: Secondary | ICD-10-CM | POA: Diagnosis not present

## 2017-10-14 DIAGNOSIS — E119 Type 2 diabetes mellitus without complications: Secondary | ICD-10-CM | POA: Diagnosis not present

## 2017-10-14 DIAGNOSIS — Z951 Presence of aortocoronary bypass graft: Secondary | ICD-10-CM | POA: Diagnosis not present

## 2017-10-19 DIAGNOSIS — Z6824 Body mass index (BMI) 24.0-24.9, adult: Secondary | ICD-10-CM | POA: Diagnosis not present

## 2017-10-19 DIAGNOSIS — Z299 Encounter for prophylactic measures, unspecified: Secondary | ICD-10-CM | POA: Diagnosis not present

## 2017-10-19 DIAGNOSIS — J449 Chronic obstructive pulmonary disease, unspecified: Secondary | ICD-10-CM | POA: Diagnosis not present

## 2017-10-19 DIAGNOSIS — E1165 Type 2 diabetes mellitus with hyperglycemia: Secondary | ICD-10-CM | POA: Diagnosis not present

## 2017-10-19 DIAGNOSIS — Z09 Encounter for follow-up examination after completed treatment for conditions other than malignant neoplasm: Secondary | ICD-10-CM | POA: Diagnosis not present

## 2017-10-19 DIAGNOSIS — I5020 Unspecified systolic (congestive) heart failure: Secondary | ICD-10-CM | POA: Diagnosis not present

## 2017-10-19 DIAGNOSIS — I110 Hypertensive heart disease with heart failure: Secondary | ICD-10-CM | POA: Diagnosis not present

## 2017-10-19 DIAGNOSIS — I10 Essential (primary) hypertension: Secondary | ICD-10-CM | POA: Diagnosis not present

## 2017-10-19 DIAGNOSIS — J441 Chronic obstructive pulmonary disease with (acute) exacerbation: Secondary | ICD-10-CM | POA: Diagnosis not present

## 2017-10-19 DIAGNOSIS — I5023 Acute on chronic systolic (congestive) heart failure: Secondary | ICD-10-CM | POA: Diagnosis not present

## 2017-10-19 DIAGNOSIS — I2510 Atherosclerotic heart disease of native coronary artery without angina pectoris: Secondary | ICD-10-CM | POA: Diagnosis not present

## 2017-10-19 DIAGNOSIS — J189 Pneumonia, unspecified organism: Secondary | ICD-10-CM | POA: Diagnosis not present

## 2017-10-19 DIAGNOSIS — J440 Chronic obstructive pulmonary disease with acute lower respiratory infection: Secondary | ICD-10-CM | POA: Diagnosis not present

## 2017-10-19 DIAGNOSIS — J181 Lobar pneumonia, unspecified organism: Secondary | ICD-10-CM | POA: Diagnosis not present

## 2017-10-20 DIAGNOSIS — I5023 Acute on chronic systolic (congestive) heart failure: Secondary | ICD-10-CM | POA: Diagnosis not present

## 2017-10-20 DIAGNOSIS — I2510 Atherosclerotic heart disease of native coronary artery without angina pectoris: Secondary | ICD-10-CM | POA: Diagnosis not present

## 2017-10-20 DIAGNOSIS — J440 Chronic obstructive pulmonary disease with acute lower respiratory infection: Secondary | ICD-10-CM | POA: Diagnosis not present

## 2017-10-20 DIAGNOSIS — I110 Hypertensive heart disease with heart failure: Secondary | ICD-10-CM | POA: Diagnosis not present

## 2017-10-20 DIAGNOSIS — J181 Lobar pneumonia, unspecified organism: Secondary | ICD-10-CM | POA: Diagnosis not present

## 2017-10-20 DIAGNOSIS — J441 Chronic obstructive pulmonary disease with (acute) exacerbation: Secondary | ICD-10-CM | POA: Diagnosis not present

## 2017-10-21 DIAGNOSIS — J440 Chronic obstructive pulmonary disease with acute lower respiratory infection: Secondary | ICD-10-CM | POA: Diagnosis not present

## 2017-10-21 DIAGNOSIS — J181 Lobar pneumonia, unspecified organism: Secondary | ICD-10-CM | POA: Diagnosis not present

## 2017-10-21 DIAGNOSIS — J441 Chronic obstructive pulmonary disease with (acute) exacerbation: Secondary | ICD-10-CM | POA: Diagnosis not present

## 2017-10-21 DIAGNOSIS — I5023 Acute on chronic systolic (congestive) heart failure: Secondary | ICD-10-CM | POA: Diagnosis not present

## 2017-10-21 DIAGNOSIS — I2510 Atherosclerotic heart disease of native coronary artery without angina pectoris: Secondary | ICD-10-CM | POA: Diagnosis not present

## 2017-10-21 DIAGNOSIS — I110 Hypertensive heart disease with heart failure: Secondary | ICD-10-CM | POA: Diagnosis not present

## 2017-10-24 DIAGNOSIS — I2510 Atherosclerotic heart disease of native coronary artery without angina pectoris: Secondary | ICD-10-CM | POA: Diagnosis not present

## 2017-10-24 DIAGNOSIS — J441 Chronic obstructive pulmonary disease with (acute) exacerbation: Secondary | ICD-10-CM | POA: Diagnosis not present

## 2017-10-24 DIAGNOSIS — J440 Chronic obstructive pulmonary disease with acute lower respiratory infection: Secondary | ICD-10-CM | POA: Diagnosis not present

## 2017-10-24 DIAGNOSIS — I5023 Acute on chronic systolic (congestive) heart failure: Secondary | ICD-10-CM | POA: Diagnosis not present

## 2017-10-24 DIAGNOSIS — J181 Lobar pneumonia, unspecified organism: Secondary | ICD-10-CM | POA: Diagnosis not present

## 2017-10-24 DIAGNOSIS — I110 Hypertensive heart disease with heart failure: Secondary | ICD-10-CM | POA: Diagnosis not present

## 2017-10-25 DIAGNOSIS — J181 Lobar pneumonia, unspecified organism: Secondary | ICD-10-CM | POA: Diagnosis not present

## 2017-10-25 DIAGNOSIS — I5023 Acute on chronic systolic (congestive) heart failure: Secondary | ICD-10-CM | POA: Diagnosis not present

## 2017-10-25 DIAGNOSIS — I110 Hypertensive heart disease with heart failure: Secondary | ICD-10-CM | POA: Diagnosis not present

## 2017-10-25 DIAGNOSIS — J440 Chronic obstructive pulmonary disease with acute lower respiratory infection: Secondary | ICD-10-CM | POA: Diagnosis not present

## 2017-10-25 DIAGNOSIS — I2510 Atherosclerotic heart disease of native coronary artery without angina pectoris: Secondary | ICD-10-CM | POA: Diagnosis not present

## 2017-10-25 DIAGNOSIS — J441 Chronic obstructive pulmonary disease with (acute) exacerbation: Secondary | ICD-10-CM | POA: Diagnosis not present

## 2017-10-28 DIAGNOSIS — J441 Chronic obstructive pulmonary disease with (acute) exacerbation: Secondary | ICD-10-CM | POA: Diagnosis not present

## 2017-10-28 DIAGNOSIS — J440 Chronic obstructive pulmonary disease with acute lower respiratory infection: Secondary | ICD-10-CM | POA: Diagnosis not present

## 2017-10-28 DIAGNOSIS — I5023 Acute on chronic systolic (congestive) heart failure: Secondary | ICD-10-CM | POA: Diagnosis not present

## 2017-10-28 DIAGNOSIS — I2510 Atherosclerotic heart disease of native coronary artery without angina pectoris: Secondary | ICD-10-CM | POA: Diagnosis not present

## 2017-10-28 DIAGNOSIS — J181 Lobar pneumonia, unspecified organism: Secondary | ICD-10-CM | POA: Diagnosis not present

## 2017-10-28 DIAGNOSIS — I110 Hypertensive heart disease with heart failure: Secondary | ICD-10-CM | POA: Diagnosis not present

## 2017-10-31 DIAGNOSIS — I110 Hypertensive heart disease with heart failure: Secondary | ICD-10-CM | POA: Diagnosis not present

## 2017-10-31 DIAGNOSIS — J440 Chronic obstructive pulmonary disease with acute lower respiratory infection: Secondary | ICD-10-CM | POA: Diagnosis not present

## 2017-10-31 DIAGNOSIS — I5023 Acute on chronic systolic (congestive) heart failure: Secondary | ICD-10-CM | POA: Diagnosis not present

## 2017-10-31 DIAGNOSIS — J441 Chronic obstructive pulmonary disease with (acute) exacerbation: Secondary | ICD-10-CM | POA: Diagnosis not present

## 2017-10-31 DIAGNOSIS — I2510 Atherosclerotic heart disease of native coronary artery without angina pectoris: Secondary | ICD-10-CM | POA: Diagnosis not present

## 2017-10-31 DIAGNOSIS — J181 Lobar pneumonia, unspecified organism: Secondary | ICD-10-CM | POA: Diagnosis not present

## 2017-11-01 DIAGNOSIS — J441 Chronic obstructive pulmonary disease with (acute) exacerbation: Secondary | ICD-10-CM | POA: Diagnosis not present

## 2017-11-01 DIAGNOSIS — I5023 Acute on chronic systolic (congestive) heart failure: Secondary | ICD-10-CM | POA: Diagnosis not present

## 2017-11-01 DIAGNOSIS — J181 Lobar pneumonia, unspecified organism: Secondary | ICD-10-CM | POA: Diagnosis not present

## 2017-11-01 DIAGNOSIS — I2510 Atherosclerotic heart disease of native coronary artery without angina pectoris: Secondary | ICD-10-CM | POA: Diagnosis not present

## 2017-11-01 DIAGNOSIS — I110 Hypertensive heart disease with heart failure: Secondary | ICD-10-CM | POA: Diagnosis not present

## 2017-11-01 DIAGNOSIS — J440 Chronic obstructive pulmonary disease with acute lower respiratory infection: Secondary | ICD-10-CM | POA: Diagnosis not present

## 2017-11-03 DIAGNOSIS — I2510 Atherosclerotic heart disease of native coronary artery without angina pectoris: Secondary | ICD-10-CM | POA: Diagnosis not present

## 2017-11-03 DIAGNOSIS — I110 Hypertensive heart disease with heart failure: Secondary | ICD-10-CM | POA: Diagnosis not present

## 2017-11-03 DIAGNOSIS — J441 Chronic obstructive pulmonary disease with (acute) exacerbation: Secondary | ICD-10-CM | POA: Diagnosis not present

## 2017-11-03 DIAGNOSIS — I5023 Acute on chronic systolic (congestive) heart failure: Secondary | ICD-10-CM | POA: Diagnosis not present

## 2017-11-03 DIAGNOSIS — J440 Chronic obstructive pulmonary disease with acute lower respiratory infection: Secondary | ICD-10-CM | POA: Diagnosis not present

## 2017-11-03 DIAGNOSIS — J181 Lobar pneumonia, unspecified organism: Secondary | ICD-10-CM | POA: Diagnosis not present

## 2017-11-05 DIAGNOSIS — Z9981 Dependence on supplemental oxygen: Secondary | ICD-10-CM | POA: Diagnosis not present

## 2017-11-05 DIAGNOSIS — I5033 Acute on chronic diastolic (congestive) heart failure: Secondary | ICD-10-CM | POA: Diagnosis not present

## 2017-11-05 DIAGNOSIS — J9621 Acute and chronic respiratory failure with hypoxia: Secondary | ICD-10-CM | POA: Diagnosis not present

## 2017-11-05 DIAGNOSIS — Z9119 Patient's noncompliance with other medical treatment and regimen: Secondary | ICD-10-CM | POA: Diagnosis not present

## 2017-11-05 DIAGNOSIS — E669 Obesity, unspecified: Secondary | ICD-10-CM | POA: Diagnosis present

## 2017-11-05 DIAGNOSIS — Z951 Presence of aortocoronary bypass graft: Secondary | ICD-10-CM | POA: Diagnosis not present

## 2017-11-05 DIAGNOSIS — Z86718 Personal history of other venous thrombosis and embolism: Secondary | ICD-10-CM | POA: Diagnosis not present

## 2017-11-05 DIAGNOSIS — Z794 Long term (current) use of insulin: Secondary | ICD-10-CM | POA: Diagnosis not present

## 2017-11-05 DIAGNOSIS — I509 Heart failure, unspecified: Secondary | ICD-10-CM | POA: Diagnosis not present

## 2017-11-05 DIAGNOSIS — M1990 Unspecified osteoarthritis, unspecified site: Secondary | ICD-10-CM | POA: Diagnosis present

## 2017-11-05 DIAGNOSIS — J441 Chronic obstructive pulmonary disease with (acute) exacerbation: Secondary | ICD-10-CM | POA: Diagnosis not present

## 2017-11-05 DIAGNOSIS — I2510 Atherosclerotic heart disease of native coronary artery without angina pectoris: Secondary | ICD-10-CM | POA: Diagnosis present

## 2017-11-05 DIAGNOSIS — Z6841 Body Mass Index (BMI) 40.0 and over, adult: Secondary | ICD-10-CM | POA: Diagnosis not present

## 2017-11-05 DIAGNOSIS — Z79899 Other long term (current) drug therapy: Secondary | ICD-10-CM | POA: Diagnosis not present

## 2017-11-05 DIAGNOSIS — Z7902 Long term (current) use of antithrombotics/antiplatelets: Secondary | ICD-10-CM | POA: Diagnosis not present

## 2017-11-05 DIAGNOSIS — Z7901 Long term (current) use of anticoagulants: Secondary | ICD-10-CM | POA: Diagnosis not present

## 2017-11-05 DIAGNOSIS — I252 Old myocardial infarction: Secondary | ICD-10-CM | POA: Diagnosis not present

## 2017-11-05 DIAGNOSIS — Z930 Tracheostomy status: Secondary | ICD-10-CM | POA: Diagnosis not present

## 2017-11-05 DIAGNOSIS — E119 Type 2 diabetes mellitus without complications: Secondary | ICD-10-CM | POA: Diagnosis present

## 2017-11-05 DIAGNOSIS — R0602 Shortness of breath: Secondary | ICD-10-CM | POA: Diagnosis not present

## 2017-11-05 DIAGNOSIS — E7800 Pure hypercholesterolemia, unspecified: Secondary | ICD-10-CM | POA: Diagnosis present

## 2017-11-05 DIAGNOSIS — I110 Hypertensive heart disease with heart failure: Secondary | ICD-10-CM | POA: Diagnosis not present

## 2017-11-09 DIAGNOSIS — J181 Lobar pneumonia, unspecified organism: Secondary | ICD-10-CM | POA: Diagnosis not present

## 2017-11-09 DIAGNOSIS — J440 Chronic obstructive pulmonary disease with acute lower respiratory infection: Secondary | ICD-10-CM | POA: Diagnosis not present

## 2017-11-09 DIAGNOSIS — I5023 Acute on chronic systolic (congestive) heart failure: Secondary | ICD-10-CM | POA: Diagnosis not present

## 2017-11-09 DIAGNOSIS — J441 Chronic obstructive pulmonary disease with (acute) exacerbation: Secondary | ICD-10-CM | POA: Diagnosis not present

## 2017-11-09 DIAGNOSIS — I2510 Atherosclerotic heart disease of native coronary artery without angina pectoris: Secondary | ICD-10-CM | POA: Diagnosis not present

## 2017-11-09 DIAGNOSIS — I110 Hypertensive heart disease with heart failure: Secondary | ICD-10-CM | POA: Diagnosis not present

## 2017-11-10 DIAGNOSIS — I5023 Acute on chronic systolic (congestive) heart failure: Secondary | ICD-10-CM | POA: Diagnosis not present

## 2017-11-10 DIAGNOSIS — J440 Chronic obstructive pulmonary disease with acute lower respiratory infection: Secondary | ICD-10-CM | POA: Diagnosis not present

## 2017-11-10 DIAGNOSIS — I2510 Atherosclerotic heart disease of native coronary artery without angina pectoris: Secondary | ICD-10-CM | POA: Diagnosis not present

## 2017-11-10 DIAGNOSIS — I110 Hypertensive heart disease with heart failure: Secondary | ICD-10-CM | POA: Diagnosis not present

## 2017-11-10 DIAGNOSIS — J441 Chronic obstructive pulmonary disease with (acute) exacerbation: Secondary | ICD-10-CM | POA: Diagnosis not present

## 2017-11-10 DIAGNOSIS — J181 Lobar pneumonia, unspecified organism: Secondary | ICD-10-CM | POA: Diagnosis not present

## 2017-11-11 DIAGNOSIS — I5023 Acute on chronic systolic (congestive) heart failure: Secondary | ICD-10-CM | POA: Diagnosis not present

## 2017-11-11 DIAGNOSIS — J441 Chronic obstructive pulmonary disease with (acute) exacerbation: Secondary | ICD-10-CM | POA: Diagnosis not present

## 2017-11-11 DIAGNOSIS — I2510 Atherosclerotic heart disease of native coronary artery without angina pectoris: Secondary | ICD-10-CM | POA: Diagnosis not present

## 2017-11-11 DIAGNOSIS — J440 Chronic obstructive pulmonary disease with acute lower respiratory infection: Secondary | ICD-10-CM | POA: Diagnosis not present

## 2017-11-11 DIAGNOSIS — J181 Lobar pneumonia, unspecified organism: Secondary | ICD-10-CM | POA: Diagnosis not present

## 2017-11-11 DIAGNOSIS — I110 Hypertensive heart disease with heart failure: Secondary | ICD-10-CM | POA: Diagnosis not present

## 2017-11-14 DIAGNOSIS — E1165 Type 2 diabetes mellitus with hyperglycemia: Secondary | ICD-10-CM | POA: Diagnosis not present

## 2017-11-14 DIAGNOSIS — I10 Essential (primary) hypertension: Secondary | ICD-10-CM | POA: Diagnosis not present

## 2017-11-14 DIAGNOSIS — J181 Lobar pneumonia, unspecified organism: Secondary | ICD-10-CM | POA: Diagnosis not present

## 2017-11-14 DIAGNOSIS — J441 Chronic obstructive pulmonary disease with (acute) exacerbation: Secondary | ICD-10-CM | POA: Diagnosis not present

## 2017-11-14 DIAGNOSIS — J440 Chronic obstructive pulmonary disease with acute lower respiratory infection: Secondary | ICD-10-CM | POA: Diagnosis not present

## 2017-11-14 DIAGNOSIS — I5030 Unspecified diastolic (congestive) heart failure: Secondary | ICD-10-CM | POA: Diagnosis not present

## 2017-11-14 DIAGNOSIS — I80209 Phlebitis and thrombophlebitis of unspecified deep vessels of unspecified lower extremity: Secondary | ICD-10-CM | POA: Diagnosis not present

## 2017-11-14 DIAGNOSIS — J449 Chronic obstructive pulmonary disease, unspecified: Secondary | ICD-10-CM | POA: Diagnosis not present

## 2017-11-14 DIAGNOSIS — I5023 Acute on chronic systolic (congestive) heart failure: Secondary | ICD-10-CM | POA: Diagnosis not present

## 2017-11-14 DIAGNOSIS — Z6841 Body Mass Index (BMI) 40.0 and over, adult: Secondary | ICD-10-CM | POA: Diagnosis not present

## 2017-11-14 DIAGNOSIS — I2510 Atherosclerotic heart disease of native coronary artery without angina pectoris: Secondary | ICD-10-CM | POA: Diagnosis not present

## 2017-11-14 DIAGNOSIS — I82409 Acute embolism and thrombosis of unspecified deep veins of unspecified lower extremity: Secondary | ICD-10-CM | POA: Diagnosis not present

## 2017-11-14 DIAGNOSIS — I110 Hypertensive heart disease with heart failure: Secondary | ICD-10-CM | POA: Diagnosis not present

## 2017-11-14 DIAGNOSIS — E1142 Type 2 diabetes mellitus with diabetic polyneuropathy: Secondary | ICD-10-CM | POA: Diagnosis not present

## 2017-11-14 DIAGNOSIS — Z299 Encounter for prophylactic measures, unspecified: Secondary | ICD-10-CM | POA: Diagnosis not present

## 2017-11-15 DIAGNOSIS — I110 Hypertensive heart disease with heart failure: Secondary | ICD-10-CM | POA: Diagnosis not present

## 2017-11-15 DIAGNOSIS — J440 Chronic obstructive pulmonary disease with acute lower respiratory infection: Secondary | ICD-10-CM | POA: Diagnosis not present

## 2017-11-15 DIAGNOSIS — J181 Lobar pneumonia, unspecified organism: Secondary | ICD-10-CM | POA: Diagnosis not present

## 2017-11-15 DIAGNOSIS — J441 Chronic obstructive pulmonary disease with (acute) exacerbation: Secondary | ICD-10-CM | POA: Diagnosis not present

## 2017-11-15 DIAGNOSIS — I5023 Acute on chronic systolic (congestive) heart failure: Secondary | ICD-10-CM | POA: Diagnosis not present

## 2017-11-15 DIAGNOSIS — I2510 Atherosclerotic heart disease of native coronary artery without angina pectoris: Secondary | ICD-10-CM | POA: Diagnosis not present

## 2017-11-17 DIAGNOSIS — J181 Lobar pneumonia, unspecified organism: Secondary | ICD-10-CM | POA: Diagnosis not present

## 2017-11-17 DIAGNOSIS — I2510 Atherosclerotic heart disease of native coronary artery without angina pectoris: Secondary | ICD-10-CM | POA: Diagnosis not present

## 2017-11-17 DIAGNOSIS — J441 Chronic obstructive pulmonary disease with (acute) exacerbation: Secondary | ICD-10-CM | POA: Diagnosis not present

## 2017-11-17 DIAGNOSIS — I5023 Acute on chronic systolic (congestive) heart failure: Secondary | ICD-10-CM | POA: Diagnosis not present

## 2017-11-17 DIAGNOSIS — I110 Hypertensive heart disease with heart failure: Secondary | ICD-10-CM | POA: Diagnosis not present

## 2017-11-17 DIAGNOSIS — J440 Chronic obstructive pulmonary disease with acute lower respiratory infection: Secondary | ICD-10-CM | POA: Diagnosis not present

## 2017-11-18 DIAGNOSIS — I2510 Atherosclerotic heart disease of native coronary artery without angina pectoris: Secondary | ICD-10-CM | POA: Diagnosis not present

## 2017-11-18 DIAGNOSIS — I110 Hypertensive heart disease with heart failure: Secondary | ICD-10-CM | POA: Diagnosis not present

## 2017-11-18 DIAGNOSIS — J441 Chronic obstructive pulmonary disease with (acute) exacerbation: Secondary | ICD-10-CM | POA: Diagnosis not present

## 2017-11-18 DIAGNOSIS — J440 Chronic obstructive pulmonary disease with acute lower respiratory infection: Secondary | ICD-10-CM | POA: Diagnosis not present

## 2017-11-18 DIAGNOSIS — J181 Lobar pneumonia, unspecified organism: Secondary | ICD-10-CM | POA: Diagnosis not present

## 2017-11-18 DIAGNOSIS — I5023 Acute on chronic systolic (congestive) heart failure: Secondary | ICD-10-CM | POA: Diagnosis not present

## 2017-11-22 DIAGNOSIS — J440 Chronic obstructive pulmonary disease with acute lower respiratory infection: Secondary | ICD-10-CM | POA: Diagnosis not present

## 2017-11-22 DIAGNOSIS — J441 Chronic obstructive pulmonary disease with (acute) exacerbation: Secondary | ICD-10-CM | POA: Diagnosis not present

## 2017-11-22 DIAGNOSIS — J181 Lobar pneumonia, unspecified organism: Secondary | ICD-10-CM | POA: Diagnosis not present

## 2017-11-22 DIAGNOSIS — I5023 Acute on chronic systolic (congestive) heart failure: Secondary | ICD-10-CM | POA: Diagnosis not present

## 2017-11-22 DIAGNOSIS — I110 Hypertensive heart disease with heart failure: Secondary | ICD-10-CM | POA: Diagnosis not present

## 2017-11-22 DIAGNOSIS — I2510 Atherosclerotic heart disease of native coronary artery without angina pectoris: Secondary | ICD-10-CM | POA: Diagnosis not present

## 2017-11-24 DIAGNOSIS — J181 Lobar pneumonia, unspecified organism: Secondary | ICD-10-CM | POA: Diagnosis not present

## 2017-11-24 DIAGNOSIS — I110 Hypertensive heart disease with heart failure: Secondary | ICD-10-CM | POA: Diagnosis not present

## 2017-11-24 DIAGNOSIS — J441 Chronic obstructive pulmonary disease with (acute) exacerbation: Secondary | ICD-10-CM | POA: Diagnosis not present

## 2017-11-24 DIAGNOSIS — I5023 Acute on chronic systolic (congestive) heart failure: Secondary | ICD-10-CM | POA: Diagnosis not present

## 2017-11-24 DIAGNOSIS — J440 Chronic obstructive pulmonary disease with acute lower respiratory infection: Secondary | ICD-10-CM | POA: Diagnosis not present

## 2017-11-24 DIAGNOSIS — I2510 Atherosclerotic heart disease of native coronary artery without angina pectoris: Secondary | ICD-10-CM | POA: Diagnosis not present

## 2017-11-25 DIAGNOSIS — I110 Hypertensive heart disease with heart failure: Secondary | ICD-10-CM | POA: Diagnosis not present

## 2017-11-25 DIAGNOSIS — J181 Lobar pneumonia, unspecified organism: Secondary | ICD-10-CM | POA: Diagnosis not present

## 2017-11-25 DIAGNOSIS — I2510 Atherosclerotic heart disease of native coronary artery without angina pectoris: Secondary | ICD-10-CM | POA: Diagnosis not present

## 2017-11-25 DIAGNOSIS — J440 Chronic obstructive pulmonary disease with acute lower respiratory infection: Secondary | ICD-10-CM | POA: Diagnosis not present

## 2017-11-25 DIAGNOSIS — I5023 Acute on chronic systolic (congestive) heart failure: Secondary | ICD-10-CM | POA: Diagnosis not present

## 2017-11-25 DIAGNOSIS — J441 Chronic obstructive pulmonary disease with (acute) exacerbation: Secondary | ICD-10-CM | POA: Diagnosis not present

## 2017-12-08 DIAGNOSIS — I110 Hypertensive heart disease with heart failure: Secondary | ICD-10-CM | POA: Diagnosis not present

## 2017-12-08 DIAGNOSIS — J181 Lobar pneumonia, unspecified organism: Secondary | ICD-10-CM | POA: Diagnosis not present

## 2017-12-08 DIAGNOSIS — I2510 Atherosclerotic heart disease of native coronary artery without angina pectoris: Secondary | ICD-10-CM | POA: Diagnosis not present

## 2017-12-08 DIAGNOSIS — J441 Chronic obstructive pulmonary disease with (acute) exacerbation: Secondary | ICD-10-CM | POA: Diagnosis not present

## 2017-12-08 DIAGNOSIS — I5023 Acute on chronic systolic (congestive) heart failure: Secondary | ICD-10-CM | POA: Diagnosis not present

## 2017-12-08 DIAGNOSIS — J440 Chronic obstructive pulmonary disease with acute lower respiratory infection: Secondary | ICD-10-CM | POA: Diagnosis not present

## 2018-01-13 ENCOUNTER — Other Ambulatory Visit: Payer: Self-pay | Admitting: Cardiology

## 2018-01-13 NOTE — Telephone Encounter (Signed)
REFILL 

## 2018-01-23 DIAGNOSIS — Z299 Encounter for prophylactic measures, unspecified: Secondary | ICD-10-CM | POA: Diagnosis not present

## 2018-01-23 DIAGNOSIS — I5030 Unspecified diastolic (congestive) heart failure: Secondary | ICD-10-CM | POA: Diagnosis not present

## 2018-01-23 DIAGNOSIS — I10 Essential (primary) hypertension: Secondary | ICD-10-CM | POA: Diagnosis not present

## 2018-01-23 DIAGNOSIS — J449 Chronic obstructive pulmonary disease, unspecified: Secondary | ICD-10-CM | POA: Diagnosis not present

## 2018-01-23 DIAGNOSIS — E1142 Type 2 diabetes mellitus with diabetic polyneuropathy: Secondary | ICD-10-CM | POA: Diagnosis not present

## 2018-01-23 DIAGNOSIS — E1165 Type 2 diabetes mellitus with hyperglycemia: Secondary | ICD-10-CM | POA: Diagnosis not present

## 2018-01-23 DIAGNOSIS — Z6841 Body Mass Index (BMI) 40.0 and over, adult: Secondary | ICD-10-CM | POA: Diagnosis not present

## 2018-01-23 DIAGNOSIS — K8020 Calculus of gallbladder without cholecystitis without obstruction: Secondary | ICD-10-CM | POA: Diagnosis not present

## 2018-01-23 DIAGNOSIS — I82409 Acute embolism and thrombosis of unspecified deep veins of unspecified lower extremity: Secondary | ICD-10-CM | POA: Diagnosis not present

## 2018-02-01 DIAGNOSIS — Z794 Long term (current) use of insulin: Secondary | ICD-10-CM | POA: Diagnosis not present

## 2018-02-01 DIAGNOSIS — I2510 Atherosclerotic heart disease of native coronary artery without angina pectoris: Secondary | ICD-10-CM | POA: Diagnosis not present

## 2018-02-01 DIAGNOSIS — E119 Type 2 diabetes mellitus without complications: Secondary | ICD-10-CM | POA: Diagnosis not present

## 2018-02-01 DIAGNOSIS — Z79899 Other long term (current) drug therapy: Secondary | ICD-10-CM | POA: Diagnosis not present

## 2018-02-01 DIAGNOSIS — R197 Diarrhea, unspecified: Secondary | ICD-10-CM | POA: Diagnosis not present

## 2018-02-01 DIAGNOSIS — Z9981 Dependence on supplemental oxygen: Secondary | ICD-10-CM | POA: Diagnosis not present

## 2018-02-01 DIAGNOSIS — I110 Hypertensive heart disease with heart failure: Secondary | ICD-10-CM | POA: Diagnosis not present

## 2018-02-01 DIAGNOSIS — I509 Heart failure, unspecified: Secondary | ICD-10-CM | POA: Diagnosis not present

## 2018-02-01 DIAGNOSIS — J449 Chronic obstructive pulmonary disease, unspecified: Secondary | ICD-10-CM | POA: Diagnosis not present

## 2018-02-01 DIAGNOSIS — Z951 Presence of aortocoronary bypass graft: Secondary | ICD-10-CM | POA: Diagnosis not present

## 2018-02-01 DIAGNOSIS — K529 Noninfective gastroenteritis and colitis, unspecified: Secondary | ICD-10-CM | POA: Diagnosis not present

## 2018-02-01 DIAGNOSIS — Z930 Tracheostomy status: Secondary | ICD-10-CM | POA: Diagnosis not present

## 2018-02-01 DIAGNOSIS — F17200 Nicotine dependence, unspecified, uncomplicated: Secondary | ICD-10-CM | POA: Diagnosis not present

## 2018-02-01 DIAGNOSIS — Z955 Presence of coronary angioplasty implant and graft: Secondary | ICD-10-CM | POA: Diagnosis not present

## 2018-02-01 DIAGNOSIS — I252 Old myocardial infarction: Secondary | ICD-10-CM | POA: Diagnosis not present

## 2018-02-01 DIAGNOSIS — Z7902 Long term (current) use of antithrombotics/antiplatelets: Secondary | ICD-10-CM | POA: Diagnosis not present

## 2018-02-27 DIAGNOSIS — R300 Dysuria: Secondary | ICD-10-CM | POA: Diagnosis not present

## 2018-02-27 DIAGNOSIS — I10 Essential (primary) hypertension: Secondary | ICD-10-CM | POA: Diagnosis not present

## 2018-02-27 DIAGNOSIS — Z299 Encounter for prophylactic measures, unspecified: Secondary | ICD-10-CM | POA: Diagnosis not present

## 2018-02-27 DIAGNOSIS — N390 Urinary tract infection, site not specified: Secondary | ICD-10-CM | POA: Diagnosis not present

## 2018-02-27 DIAGNOSIS — Z6841 Body Mass Index (BMI) 40.0 and over, adult: Secondary | ICD-10-CM | POA: Diagnosis not present

## 2018-02-27 DIAGNOSIS — R079 Chest pain, unspecified: Secondary | ICD-10-CM | POA: Diagnosis not present

## 2018-02-27 DIAGNOSIS — I80209 Phlebitis and thrombophlebitis of unspecified deep vessels of unspecified lower extremity: Secondary | ICD-10-CM | POA: Diagnosis not present

## 2018-02-27 DIAGNOSIS — K649 Unspecified hemorrhoids: Secondary | ICD-10-CM | POA: Diagnosis not present

## 2018-03-02 ENCOUNTER — Encounter: Payer: Self-pay | Admitting: *Deleted

## 2018-03-02 ENCOUNTER — Telehealth: Payer: Self-pay | Admitting: Cardiovascular Disease

## 2018-03-02 NOTE — Telephone Encounter (Signed)
Per pt says she was told she had a "mild heart attack" - Dr Manuella Ghazi requested pt be seen - scheduled with Melina Copa, PA 7/17 and will also place on Dr Bronson Ing wait list - will request office notes/EKG from Dr Manuella Ghazi - pt denies another episode happening since Saturday - doesn't know what HR/BP has been running

## 2018-03-02 NOTE — Telephone Encounter (Signed)
Patient called office stating that she thinks she had a "mild" heart attack this week. She went to see Dr. Manuella Ghazi and was advised to call our office.

## 2018-03-02 NOTE — Telephone Encounter (Signed)
Pt says Saturday she started passing blood clots in her urine - went to Dr Manuella Ghazi who says she has UTI - pt says she took Azo for symptoms and started sweating/left arm pain/chest pain - went back to Dr Manuella Ghazi who did EKG and

## 2018-03-14 ENCOUNTER — Other Ambulatory Visit: Payer: Self-pay | Admitting: Cardiology

## 2018-03-21 ENCOUNTER — Encounter: Payer: Self-pay | Admitting: Physician Assistant

## 2018-03-21 NOTE — Progress Notes (Signed)
Cardiology Office Note    Date:  03/22/2018  ID:  Theresa [name], DOB Jun 08, 1966, MRN [id number] PCP:  Theresa Blitz, MD  Cardiologist:  Theresa Breeding, MD   Chief Complaint: f/u chest pain  History of Present Illness:  Theresa [name] is a 52 y.o. female with history of CAD (s/p prior PCI, CABG 2007 at [facility] in [place] 2007, inferior STEMI 10/2015 s/p DES to dSVG-PDA), chronic diastolic CHF, morbid obesity, COPD, DM, HTN, HLD, lupus anticoagulant, DVT on chronic anticoagulation, cervical cancer, endometriosis, chronic respiratory failure with trachestomy 2002 who presents for cardiac f/u. Last cath was in 10/2015 at time of inferior STEMI which was felt secondary to sub-total occlusion anastomosis of SVG to PDA, patent LIMA-LAD, patent stent in prox Cx, CTO prox RCA, CTO prox LAD. At that time she was discharged on Brilinta and Coumadin, and is now on Plavix plus Coumadin. Last echo 07/2017 showed mild LVH, EF 50% with akinesis of mid anteroseptal myocardium, hypokinesis of the basal-midinferior myocardium, mild MR, mod LAE, PASP 78mmHg. Last labs 07/2017 showed WBC 12.2, Hgb 13.3, plt 206, K 4.4, Cr 1.01, glucose 368. INR followed by PCP. She reports she's had several admissions earlier this year for congestive heart failure at [facility]. I do not have those records. She is supposed to be on Lasix 40mg  BID but self-doses depending on how she is feeling and admits recently she's been taking it roughly every other day as sometimes it doesn't produce the UOP she would expect.  She presents with complaints of recent progressive chest pain. A few weeks ago while resting she had abrupt onset of severe substernal chest pressure associated with nausea, vomiting, diaphoresis, and bilateral arm pain. She states she knew she was having a heart attack but was so tired of going to the hospital so opted to wait it out. She took SL NTG and vomited and the pain gradually eased off over 20  minutes. She saw her PCP because she was also having some blood in her urine and stool. She received abx for UTI and hemorrhoid cream and bleeding resolved. No recent CBC on file from those records. Since that time she's had daily exertional chest burning and dyspnea which seems to be getting more frequent. She has marked LEE on exam as well. Pulse ox 89-90% on trache 3L. Reports home weight around 197-200. Does not add salt but does eat foods high in sodium like canned goods.  Past Medical History:  Diagnosis Date  . CAD (coronary artery disease)    a. s/p prior PCI. b. CABG 2007 at [facility] in [place] 2007. c. inferior STEMI 10/2015 s/p DES to dSVG-PDA.  Marland Kitchen Cervical cancer ([place])   . Chronic diastolic CHF (congestive heart failure) ([place])   . Chronic respiratory failure ([place])    s/p tracheostomy 2002  . Chronic RUQ pain   . COPD (chronic obstructive pulmonary disease) ([place])   . Diabetes mellitus ([place])   . DVT (deep venous thrombosis) ([place])   . Endometriosis   . History of gallstones 01/2016   seen on Ultrasound  . History of HIDA scan 11/2016   normal  . HTN (hypertension)   . Hyperlipidemia   . Lupus anticoagulant disorder (HCC)    on coumadin  . Morbid obesity ([place])   . ST elevation (STEMI) myocardial infarction involving right coronary artery ([place]) 10/29/15   stent to VG to PDA  . Tracheostomy in place [facility]), chronic since 2002 11/03/2015  Past Surgical History:  Procedure Laterality Date  . CARDIAC CATHETERIZATION N/A 10/29/2015   Procedure: Left Heart Cath and Cors/Grafts Angiography;  Surgeon: Burnell Blanks, MD;  Location: [facility];  Service: Cardiovascular;  Laterality: N/A;  . CARDIAC CATHETERIZATION  10/29/2015   Procedure: Coronary Stent Intervention;  Surgeon: Burnell Blanks, MD;  Location: [facility];  Service: Cardiovascular;;  . CAROTID STENT    . CESAREAN SECTION WITH BILATERAL TUBAL LIGATION    . CORONARY ARTERY BYPASS  GRAFT  2007   2V  . TRACHEOSTOMY      Current Medications: Current Meds  Medication Sig  . albuterol (PROVENTIL HFA;VENTOLIN HFA) 108 (90 Base) MCG/ACT inhaler Inhale 1-2 puffs into the lungs every 6 (six) hours as needed for wheezing or shortness of breath.  Marland Kitchen albuterol (PROVENTIL) (2.5 MG/3ML) 0.083% nebulizer solution Take 3 mLs (2.5 mg total) by nebulization every 4 (four) hours as needed for wheezing or shortness of breath.  . clopidogrel (PLAVIX) 75 MG tablet TAKE 1 TABLET BY MOUTH ONCE DAILY **NEED  OFFICE  VISIT** (Patient taking differently: TAKE 1 TABLET BY MOUTH ONCE DAILY)  . furosemide (LASIX) 40 MG tablet Take 1 tablet (40 mg total) by mouth 2 (two) times daily.  Marland Kitchen gabapentin (NEURONTIN) 300 MG capsule Take 300-600 mg by mouth 2 (two) times daily.  . insulin degludec (TRESIBA FLEXTOUCH) 100 UNIT/ML SOPN FlexTouch Pen Inject 25 Units into the skin daily at 10 pm.  . lisinopril (PRINIVIL,ZESTRIL) 20 MG tablet TAKE 1 TABLET BY MOUTH ONCE DAILY  . metoprolol (LOPRESSOR) 50 MG tablet Take 50 mg by mouth 2 (two) times daily.  . nitroGLYCERIN (NITROSTAT) 0.4 MG SL tablet Place 0.4 mg under the tongue every 5 (five) minutes as needed for chest pain.  . pantoprazole (PROTONIX) 40 MG tablet Take 40 mg by mouth daily.  . traMADol (ULTRAM) 50 MG tablet Take 1 tablet by mouth 2 (two) times daily.  Marland Kitchen warfarin (COUMADIN) 5 MG tablet Take 2.5-5 mg by mouth See admin instructions. Pt takes 2.5mg  on Sundays - pt takes 5mg  all other days, MANAGED BY PMD    Allergies:   Penicillins   Social History   Socioeconomic History  . Marital status: Married    Spouse name: Not on file  . Number of children: Not on file  . Years of education: Not on file  . Highest education level: Not on file  Occupational History  . Not on file  Social Needs  . Financial resource strain: Not on file  . Food insecurity:    Worry: Not on file    Inability: Not on file  . Transportation needs:    Medical: Not  on file    Non-medical: Not on file  Tobacco Use  . Smoking status: Current Every Day Smoker    Packs/day: 0.75    Types: Cigarettes    Start date: 10/23/1978  . Smokeless tobacco: Never Used  Substance and Sexual Activity  . Alcohol use: No    Alcohol/week: 0.0 oz  . Drug use: No  . Sexual activity: Not on file  Lifestyle  . Physical activity:    Days per week: Not on file    Minutes per session: Not on file  . Stress: Not on file  Relationships  . Social connections:    Talks on phone: Not on file    Gets together: Not on file    Attends religious service: Not on file    Active  member of club or organization: Not on file    Attends meetings of clubs or organizations: Not on file    Relationship status: Not on file  Other Topics Concern  . Not on file  Social History Narrative  . Not on file     Family History:  The patient's family history includes Diabetes in her father and mother; Hypertension in her father and mother.  ROS:   Please see the history of present illness.  All other systems are reviewed and otherwise negative.    PHYSICAL EXAM:   VS:  BP 134/78 (BP Location: Right Arm)   Pulse 84   Ht 4\' 11"  (1.499 m)   Wt 200 lb (90.7 kg)   SpO2 (!) 89%   BMI 40.40 kg/m   BMI: Body mass index is 40.4 kg/m. GEN: Well nourished, well developed morbidly obese WF in no acute distress HEENT: normocephalic, atraumatic Neck: no JVD, carotid bruits, or masses Cardiac: RRR; no murmurs, rubs, or gallops, 3+ BLE edema  Respiratory:  clear to auscultation bilaterally, normal work of breathing GI: soft, nontender, nondistended, + BS MS: no deformity or atrophy Skin: warm and dry, no rash Neuro:  Alert and Oriented x 3, Strength and sensation are intact, follows commands Psych: euthymic mood, full affect  Wt Readings from Last 3 Encounters:  03/22/18 200 lb (90.7 kg)  08/04/17 215 lb 8 oz (97.8 kg)  02/23/17 208 lb 12.8 oz (94.7 kg)      Studies/Labs Reviewed:    EKG:  EKG was ordered today and personally reviewed by me and demonstrates NSR 78bpm, diffuse nonspecific ST-T changes with mild ST depression I, II, avF, V5-6 similar to prior.  Recent Labs: 07/31/2017: B Natriuretic Peptide 1,089.0 08/01/2017: TSH 0.651 08/02/2017: ALT 13 08/03/2017: Magnesium 2.3 08/04/2017: BUN 29; Creatinine, Ser 1.01; Hemoglobin 13.3; Platelets 206; Potassium 4.4; Sodium 135   Lipid Panel    Component Value Date/Time   CHOL 216 (H) 10/30/2015 0445   TRIG 141 10/30/2015 0445   HDL 54 10/30/2015 0445   CHOLHDL 4.0 10/30/2015 0445   VLDL 28 10/30/2015 0445   LDLCALC 134 (H) 10/30/2015 0445    Additional studies/ records that were reviewed today include: Summarized above.   ASSESSMENT & PLAN:   1. CAD with symptoms suggestive of unstable angina - discussed pt with Dr. Harl Bowie. Based on reported symptoms we are concerned she may have had an MI a few weeks ago and could be experiencing unstable angina since that time. Outpatient coordination of care would be made challenging given her acute CHF as well as chronic anticoagulation due to positive prior DVT/lupus anticoagulant. Given progressive nature of sx we have recommended admission to the hospital for probable cardiac cath when INR is acceptable. She will be transported to the ER with nursing staff. Recommend to check troponins, hold Coumadin on admission, use heparin per pharmacy when INR <2, initiate nitrate therapy, and diurese. She will also need a CBC given reports of recent bleeding although this has resolved. Our team will follow in the AM. I also outlined our concerns in a letter for her to give to ER triage and nursing staff called to alert ER team that she will be coming. 2. Acute on chronic diastolic CHF - inconsistent use of diuretic at home, likely leading to treatment failures. Discussed consistent dosing which will be determined at DC. May benefit from switching to torsemide as she reports sometimes  Lasix does not work as well as she thought it  would. IV Lasix would be helpful in the hospital, with careful attention to renal function while admitted. Recommend 2D echocardiogram to re-evaluate LV function. 3. HTN - follow BP with diuresis. 4. Hyperlipidemia- not on statin for unclear reasons. Would suggest lipid profile and initiation in the hospital. Was previously on Lipitor.  Disposition: F/u TBD following hospital stay.  Medication Adjustments/Labs and Tests Ordered: Current medicines are reviewed at length with the patient today.  Concerns regarding medicines are outlined above. Medication changes, Labs and Tests ordered today are summarized above and listed in the Patient Instructions accessible in Encounters.   Signed, Charlie Pitter, PA-C  03/22/2018 1:27 PM    [facility] Location in [place]. [place], [place] [address] Ph: [phone]; Fax [phone]

## 2018-03-22 ENCOUNTER — Emergency Department (HOSPITAL_COMMUNITY): Payer: Medicare Other

## 2018-03-22 ENCOUNTER — Encounter: Payer: Self-pay | Admitting: Physician Assistant

## 2018-03-22 ENCOUNTER — Other Ambulatory Visit: Payer: Self-pay

## 2018-03-22 ENCOUNTER — Encounter (HOSPITAL_COMMUNITY): Payer: Self-pay

## 2018-03-22 ENCOUNTER — Ambulatory Visit (INDEPENDENT_AMBULATORY_CARE_PROVIDER_SITE_OTHER): Payer: Medicare Other | Admitting: Physician Assistant

## 2018-03-22 ENCOUNTER — Encounter

## 2018-03-22 ENCOUNTER — Inpatient Hospital Stay (HOSPITAL_COMMUNITY)
Admission: EM | Admit: 2018-03-22 | Discharge: 2018-03-29 | DRG: 286 | Disposition: A | Discharge status: Home / Self Care | Payer: Medicare Other | Attending: Cardiology | Admitting: Cardiology

## 2018-03-22 VITALS — BP 134/78 | HR 84 | Ht 59.0 in | Wt 200.0 lb

## 2018-03-22 DIAGNOSIS — Z794 Long term (current) use of insulin: Secondary | ICD-10-CM

## 2018-03-22 DIAGNOSIS — Z8541 Personal history of malignant neoplasm of cervix uteri: Secondary | ICD-10-CM

## 2018-03-22 DIAGNOSIS — Z9981 Dependence on supplemental oxygen: Secondary | ICD-10-CM

## 2018-03-22 DIAGNOSIS — Z79899 Other long term (current) drug therapy: Secondary | ICD-10-CM | POA: Diagnosis not present

## 2018-03-22 DIAGNOSIS — I509 Heart failure, unspecified: Secondary | ICD-10-CM | POA: Diagnosis not present

## 2018-03-22 DIAGNOSIS — I25110 Atherosclerotic heart disease of native coronary artery with unstable angina pectoris: Principal | ICD-10-CM | POA: Diagnosis present

## 2018-03-22 DIAGNOSIS — I25720 Atherosclerosis of autologous artery coronary artery bypass graft(s) with unstable angina pectoris: Secondary | ICD-10-CM | POA: Diagnosis not present

## 2018-03-22 DIAGNOSIS — I5033 Acute on chronic diastolic (congestive) heart failure: Secondary | ICD-10-CM | POA: Diagnosis present

## 2018-03-22 DIAGNOSIS — I2582 Chronic total occlusion of coronary artery: Secondary | ICD-10-CM | POA: Diagnosis present

## 2018-03-22 DIAGNOSIS — Z833 Family history of diabetes mellitus: Secondary | ICD-10-CM

## 2018-03-22 DIAGNOSIS — Z7901 Long term (current) use of anticoagulants: Secondary | ICD-10-CM

## 2018-03-22 DIAGNOSIS — Z7902 Long term (current) use of antithrombotics/antiplatelets: Secondary | ICD-10-CM | POA: Diagnosis not present

## 2018-03-22 DIAGNOSIS — I200 Unstable angina: Secondary | ICD-10-CM | POA: Diagnosis not present

## 2018-03-22 DIAGNOSIS — I110 Hypertensive heart disease with heart failure: Secondary | ICD-10-CM | POA: Diagnosis present

## 2018-03-22 DIAGNOSIS — I10 Essential (primary) hypertension: Secondary | ICD-10-CM | POA: Diagnosis present

## 2018-03-22 DIAGNOSIS — W19XXXA Unspecified fall, initial encounter: Secondary | ICD-10-CM | POA: Diagnosis not present

## 2018-03-22 DIAGNOSIS — Z886 Allergy status to analgesic agent status: Secondary | ICD-10-CM

## 2018-03-22 DIAGNOSIS — R0789 Other chest pain: Secondary | ICD-10-CM | POA: Diagnosis not present

## 2018-03-22 DIAGNOSIS — N809 Endometriosis, unspecified: Secondary | ICD-10-CM | POA: Diagnosis present

## 2018-03-22 DIAGNOSIS — I272 Pulmonary hypertension, unspecified: Secondary | ICD-10-CM

## 2018-03-22 DIAGNOSIS — Z6838 Body mass index (BMI) 38.0-38.9, adult: Secondary | ICD-10-CM | POA: Diagnosis not present

## 2018-03-22 DIAGNOSIS — I252 Old myocardial infarction: Secondary | ICD-10-CM

## 2018-03-22 DIAGNOSIS — Z955 Presence of coronary angioplasty implant and graft: Secondary | ICD-10-CM

## 2018-03-22 DIAGNOSIS — Z930 Tracheostomy status: Secondary | ICD-10-CM | POA: Diagnosis not present

## 2018-03-22 DIAGNOSIS — J449 Chronic obstructive pulmonary disease, unspecified: Secondary | ICD-10-CM | POA: Diagnosis present

## 2018-03-22 DIAGNOSIS — I2510 Atherosclerotic heart disease of native coronary artery without angina pectoris: Secondary | ICD-10-CM | POA: Diagnosis present

## 2018-03-22 DIAGNOSIS — Z951 Presence of aortocoronary bypass graft: Secondary | ICD-10-CM | POA: Diagnosis not present

## 2018-03-22 DIAGNOSIS — D6862 Lupus anticoagulant syndrome: Secondary | ICD-10-CM | POA: Diagnosis present

## 2018-03-22 DIAGNOSIS — Z9114 Patient's other noncompliance with medication regimen: Secondary | ICD-10-CM

## 2018-03-22 DIAGNOSIS — F17210 Nicotine dependence, cigarettes, uncomplicated: Secondary | ICD-10-CM | POA: Diagnosis present

## 2018-03-22 DIAGNOSIS — Z86718 Personal history of other venous thrombosis and embolism: Secondary | ICD-10-CM

## 2018-03-22 DIAGNOSIS — S0990XA Unspecified injury of head, initial encounter: Secondary | ICD-10-CM | POA: Diagnosis not present

## 2018-03-22 DIAGNOSIS — E785 Hyperlipidemia, unspecified: Secondary | ICD-10-CM | POA: Diagnosis present

## 2018-03-22 DIAGNOSIS — S92912A Unspecified fracture of left toe(s), initial encounter for closed fracture: Secondary | ICD-10-CM | POA: Diagnosis present

## 2018-03-22 DIAGNOSIS — Z881 Allergy status to other antibiotic agents status: Secondary | ICD-10-CM

## 2018-03-22 DIAGNOSIS — E1165 Type 2 diabetes mellitus with hyperglycemia: Secondary | ICD-10-CM | POA: Diagnosis present

## 2018-03-22 DIAGNOSIS — J9621 Acute and chronic respiratory failure with hypoxia: Secondary | ICD-10-CM | POA: Diagnosis present

## 2018-03-22 DIAGNOSIS — S62646A Nondisplaced fracture of proximal phalanx of right little finger, initial encounter for closed fracture: Secondary | ICD-10-CM | POA: Diagnosis not present

## 2018-03-22 DIAGNOSIS — I361 Nonrheumatic tricuspid (valve) insufficiency: Secondary | ICD-10-CM | POA: Diagnosis not present

## 2018-03-22 DIAGNOSIS — I340 Nonrheumatic mitral (valve) insufficiency: Secondary | ICD-10-CM | POA: Diagnosis not present

## 2018-03-22 DIAGNOSIS — I25700 Atherosclerosis of coronary artery bypass graft(s), unspecified, with unstable angina pectoris: Secondary | ICD-10-CM | POA: Diagnosis not present

## 2018-03-22 DIAGNOSIS — I25810 Atherosclerosis of coronary artery bypass graft(s) without angina pectoris: Secondary | ICD-10-CM | POA: Diagnosis present

## 2018-03-22 DIAGNOSIS — Z880 Allergy status to penicillin: Secondary | ICD-10-CM

## 2018-03-22 DIAGNOSIS — Z8249 Family history of ischemic heart disease and other diseases of the circulatory system: Secondary | ICD-10-CM

## 2018-03-22 DIAGNOSIS — S75009A Unspecified injury of femoral artery, unspecified leg, initial encounter: Secondary | ICD-10-CM

## 2018-03-22 DIAGNOSIS — R079 Chest pain, unspecified: Secondary | ICD-10-CM | POA: Diagnosis not present

## 2018-03-22 DIAGNOSIS — E119 Type 2 diabetes mellitus without complications: Secondary | ICD-10-CM

## 2018-03-22 HISTORY — DX: Unspecified fracture of right toe(s), initial encounter for closed fracture: S92.911A

## 2018-03-22 LAB — BRAIN NATRIURETIC PEPTIDE: B NATRIURETIC PEPTIDE 5: 1049 pg/mL — AB (ref 0.0–100.0)

## 2018-03-22 LAB — BASIC METABOLIC PANEL
ANION GAP: 8 (ref 5–15)
BUN: 10 mg/dL (ref 6–20)
CO2: 28 mmol/L (ref 22–32)
Calcium: 8.6 mg/dL — ABNORMAL LOW (ref 8.9–10.3)
Chloride: 102 mmol/L (ref 98–111)
Creatinine, Ser: 0.68 mg/dL (ref 0.44–1.00)
GFR calc Af Amer: >60 mL/min (ref >60)
GFR calc non Af Amer: >60 mL/min (ref >60)
GLUCOSE: 105 mg/dL — AB (ref 70–99)
POTASSIUM: 3.7 mmol/L (ref 3.5–5.1)
Sodium: 138 mmol/L (ref 135–145)

## 2018-03-22 LAB — HEMOGLOBIN A1C
HEMOGLOBIN A1C: 8.5 % — AB (ref 4.8–5.6)
MEAN PLASMA GLUCOSE: 197.25 mg/dL

## 2018-03-22 LAB — PROTIME-INR
INR: 1.63
Prothrombin Time: 19.2 seconds — ABNORMAL HIGH (ref 11.4–15.2)

## 2018-03-22 LAB — TROPONIN I
Troponin I: <0.03 ng/mL (ref <0.03)
Troponin I: <0.03 ng/mL (ref <0.03)

## 2018-03-22 LAB — CBC
HEMATOCRIT: 41.8 % (ref 36.0–46.0)
HEMOGLOBIN: 14 g/dL (ref 12.0–15.0)
MCH: 32 pg (ref 26.0–34.0)
MCHC: 33.5 g/dL (ref 30.0–36.0)
MCV: 95.4 fL (ref 78.0–100.0)
Platelets: 159 10*3/uL (ref 150–400)
RBC: 4.38 MIL/uL (ref 3.87–5.11)
RDW: 16.7 % — ABNORMAL HIGH (ref 11.5–15.5)
WBC: 5.1 10*3/uL (ref 4.0–10.5)

## 2018-03-22 LAB — I-STAT TROPONIN, ED: Troponin i, poc: 0.01 ng/mL (ref 0.00–0.08)

## 2018-03-22 LAB — GLUCOSE, CAPILLARY
Glucose-Capillary: 95 mg/dL (ref 70–99)
Glucose-Capillary: 153 mg/dL — ABNORMAL HIGH (ref 70–99)

## 2018-03-22 LAB — HEPARIN LEVEL (UNFRACTIONATED): HEPARIN UNFRACTIONATED: 0.16 [IU]/mL — AB (ref 0.30–0.70)

## 2018-03-22 MED ORDER — HEPARIN (PORCINE) IN NACL 100-0.45 UNIT/ML-% IJ SOLN
1100.0000 [IU]/h | INTRAMUSCULAR | Status: DC
  Administered 2018-03-22: 800 [IU]/h via INTRAVENOUS
  Administered 2018-03-23: 1100 [IU]/h via INTRAVENOUS
  Filled 2018-03-22 (×2): qty 250

## 2018-03-22 MED ORDER — GABAPENTIN 300 MG PO CAPS
300.0000 mg | ORAL_CAPSULE | Freq: Two times a day (BID) | ORAL | Status: DC
  Administered 2018-03-22 – 2018-03-25 (×7): 300 mg via ORAL
  Administered 2018-03-26 – 2018-03-27 (×4): 600 mg via ORAL
  Administered 2018-03-28: 300 mg via ORAL
  Administered 2018-03-28 – 2018-03-29 (×2): 600 mg via ORAL
  Filled 2018-03-22 (×2): qty 1
  Filled 2018-03-22 (×2): qty 2
  Filled 2018-03-22 (×2): qty 1
  Filled 2018-03-22: qty 2
  Filled 2018-03-22: qty 1
  Filled 2018-03-22: qty 2
  Filled 2018-03-22 (×3): qty 1
  Filled 2018-03-22 (×2): qty 2

## 2018-03-22 MED ORDER — ORAL CARE MOUTH RINSE
15.0000 mL | Freq: Two times a day (BID) | OROMUCOSAL | Status: DC
  Administered 2018-03-22 – 2018-03-29 (×7): 15 mL via OROMUCOSAL

## 2018-03-22 MED ORDER — HEPARIN BOLUS VIA INFUSION
3900.0000 [IU] | Freq: Once | INTRAVENOUS | Status: AC
  Administered 2018-03-22: 3900 [IU] via INTRAVENOUS

## 2018-03-22 MED ORDER — NITROGLYCERIN 0.4 MG SL SUBL: 0.4000 mg | SUBLINGUAL_TABLET | SUBLINGUAL | Status: DC | PRN

## 2018-03-22 MED ORDER — FUROSEMIDE 10 MG/ML IJ SOLN
80.0000 mg | Freq: Once | INTRAMUSCULAR | Status: AC
  Administered 2018-03-22: 80 mg via INTRAVENOUS
  Filled 2018-03-22: qty 8

## 2018-03-22 MED ORDER — METOPROLOL TARTRATE 50 MG PO TABS
50.0000 mg | ORAL_TABLET | Freq: Two times a day (BID) | ORAL | Status: DC
  Administered 2018-03-22 – 2018-03-28 (×12): 50 mg via ORAL
  Filled 2018-03-22 (×14): qty 1

## 2018-03-22 MED ORDER — FUROSEMIDE 10 MG/ML IJ SOLN
40.0000 mg | Freq: Two times a day (BID) | INTRAMUSCULAR | Status: DC
  Administered 2018-03-22 – 2018-03-23 (×2): 40 mg via INTRAVENOUS
  Filled 2018-03-22 (×2): qty 4

## 2018-03-22 MED ORDER — ISOSORBIDE MONONITRATE ER 30 MG PO TB24
30.0000 mg | ORAL_TABLET | Freq: Every day | ORAL | Status: DC
  Filled 2018-03-22 (×3): qty 1

## 2018-03-22 MED ORDER — INSULIN ASPART 100 UNIT/ML ~~LOC~~ SOLN
0.0000 [IU] | Freq: Three times a day (TID) | SUBCUTANEOUS | Status: DC
  Administered 2018-03-23 – 2018-03-25 (×3): 2 [IU] via SUBCUTANEOUS
  Administered 2018-03-26: 1 [IU] via SUBCUTANEOUS
  Administered 2018-03-27: 3 [IU] via SUBCUTANEOUS
  Administered 2018-03-28: 2 [IU] via SUBCUTANEOUS
  Administered 2018-03-28: 1 [IU] via SUBCUTANEOUS
  Administered 2018-03-28: 2 [IU] via SUBCUTANEOUS

## 2018-03-22 MED ORDER — ACETAMINOPHEN 325 MG PO TABS
650.0000 mg | ORAL_TABLET | ORAL | Status: DC | PRN
  Administered 2018-03-24 – 2018-03-29 (×5): 650 mg via ORAL
  Filled 2018-03-22: qty 2

## 2018-03-22 MED ORDER — ROSUVASTATIN CALCIUM 10 MG PO TABS
5.0000 mg | ORAL_TABLET | Freq: Every day | ORAL | Status: DC
  Administered 2018-03-22 – 2018-03-23 (×2): 5 mg via ORAL
  Filled 2018-03-22 (×3): qty 1

## 2018-03-22 MED ORDER — ONDANSETRON HCL 4 MG/2ML IJ SOLN: 4.0000 mg | Freq: Four times a day (QID) | INTRAMUSCULAR | Status: DC | PRN

## 2018-03-22 MED ORDER — PANTOPRAZOLE SODIUM 40 MG PO TBEC
40.0000 mg | DELAYED_RELEASE_TABLET | Freq: Every day | ORAL | Status: DC
  Administered 2018-03-22 – 2018-03-29 (×8): 40 mg via ORAL
  Filled 2018-03-22 (×8): qty 1

## 2018-03-22 MED ORDER — ALBUTEROL SULFATE (2.5 MG/3ML) 0.083% IN NEBU: 2.5000 mg | INHALATION_SOLUTION | RESPIRATORY_TRACT | Status: DC | PRN

## 2018-03-22 MED ORDER — LISINOPRIL 20 MG PO TABS
20.0000 mg | ORAL_TABLET | Freq: Every day | ORAL | Status: DC
  Administered 2018-03-22 – 2018-03-29 (×8): 20 mg via ORAL
  Filled 2018-03-22: qty 2
  Filled 2018-03-22 (×5): qty 1
  Filled 2018-03-22: qty 2
  Filled 2018-03-22: qty 1

## 2018-03-22 MED ORDER — INSULIN GLARGINE 100 UNIT/ML ~~LOC~~ SOLN
25.0000 [IU] | Freq: Every day | SUBCUTANEOUS | Status: DC
  Administered 2018-03-22: 25 [IU] via SUBCUTANEOUS
  Filled 2018-03-22 (×2): qty 0.25

## 2018-03-22 MED ORDER — ALBUTEROL SULFATE HFA 108 (90 BASE) MCG/ACT IN AERS: 1.0000 | INHALATION_SPRAY | Freq: Four times a day (QID) | RESPIRATORY_TRACT | Status: DC | PRN

## 2018-03-22 MED ORDER — CLOPIDOGREL BISULFATE 75 MG PO TABS
75.0000 mg | ORAL_TABLET | Freq: Every day | ORAL | Status: DC
  Administered 2018-03-22 – 2018-03-29 (×8): 75 mg via ORAL
  Filled 2018-03-22 (×7): qty 1

## 2018-03-22 NOTE — Progress Notes (Signed)
Pt has a #6 Jackson metal trach secured with trach ties. Pt is refusing ATC. Pt uses a 3-4L [place] when out of hospital. [name] supplies at bedside. Pt refused suction by staff. Pt states she will suction herself. Trach since 2002

## 2018-03-22 NOTE — Progress Notes (Signed)
ANTICOAGULATION CONSULT NOTE - Initial Consult  Pharmacy Consult for Heparin Indication: chest pain/ACS  Allergies  Allergen Reactions  . Penicillins Rash    Has patient had a PCN reaction causing immediate rash, facial/tongue/throat swelling, SOB or lightheadedness with hypotension: Yes Has patient had a PCN reaction causing severe rash involving mucus membranes or skin necrosis: No Has patient had a PCN reaction that required hospitalization No Has patient had a PCN reaction occurring within the last 10 years: No If all of the above answers are "NO", then may proceed with Cephalosporin use.   REACTION: rash  . Ciprofloxacin     nausea  . Ibuprofen Rash    swelling in leg     Patient Measurements: Height: 4\' 11"  (149.9 cm) Weight: 200 lb (90.7 kg) IBW/kg (Calculated) : 43.2 HEPARIN DW (KG): 65  Vital Signs: Temp: 99 F (37.2 C) (07/17 1418) Temp Source: Oral (07/17 1418) BP: 157/109 (07/17 1530) Pulse Rate: 77 (07/17 1530)  Labs: Recent Labs    03/22/18 1434  HGB 14.0  HCT 41.8  PLT 159  LABPROT 19.2*  INR 1.63  CREATININE 0.68    Estimated Creatinine Clearance: 80.8 mL/min (by C-G formula based on SCr of 0.68 mg/dL).   Medical History: Past Medical History:  Diagnosis Date  . CAD (coronary artery disease)    a. s/p prior PCI. b. CABG 2007 at [facility] in [place] 2007. c. inferior STEMI 10/2015 s/p DES to dSVG-PDA.  Marland Kitchen Cervical cancer ([place])   . Chronic diastolic CHF (congestive heart failure) ([place])   . Chronic respiratory failure ([place])    s/p tracheostomy 2002  . Chronic RUQ pain   . COPD (chronic obstructive pulmonary disease) ([place])   . Diabetes mellitus ([place])   . DVT (deep venous thrombosis) ([place])   . Endometriosis   . History of gallstones 01/2016   seen on Ultrasound  . History of HIDA scan 11/2016   normal  . HTN (hypertension)   . Hyperlipidemia   . Lupus anticoagulant disorder (HCC)    on coumadin  . Morbid obesity ([place])   . ST  elevation (STEMI) myocardial infarction involving right coronary artery ([place]) 10/29/15   stent to VG to PDA  . Tracheostomy in place [facility]), chronic since 2002 11/03/2015    Medications:  See med rec  Assessment: 52 yo female presented to heart care office today and has had daily exertional chest burning since her chest pain a few weeks ago. Patient on chronic coumadin for previous DVT but INR is subtherapeutic at 1.63. Pharmacy asked to start heparin.  Okay for protocol  Goal of Therapy:  INR 2-3 Monitor platelets by anticoagulation protocol: Yes   Plan:  Give 3900 units bolus x 1 Start heparin infusion at 800 units/hr Check anti-Xa level in 6-8 hours and daily while on heparin Continue to monitor H&H and platelets  Isac Sarna, BS [place], BCPS Clinical Pharmacist Pager [phone] 03/22/2018,3:57 PM

## 2018-03-22 NOTE — ED Provider Notes (Signed)
[facility] EMERGENCY DEPARTMENT Provider Note   CSN: [id number] Arrival date & time: 03/22/18  1407     History   Chief Complaint Chief Complaint  Patient presents with  . Chest [name]    HPI Theresa [name] is a 52 y.o. female.  HPI Patient presents from cardiology office for admission for unstable angina.  Has had episode of chest [name] around 3 weeks ago the patient thought was a heart attack however she did not want to come in the hospital so she just waited out.  Now she has been having more shortness of breath.  States she is more short of breath and gets chest pressure.  This is presumed to be unstable angina.  States that she cannot walk very far at all before she gets [name] in her chest.  Also has had more shortness of breath.  She is on oxygen at home for some chronic lung disease also.  Has had a trach.  Some swelling in her legs.  Patient states her weight is however only up around 3 pounds.  She is [name]-free now but states that she got up and walked to the counter she would develop the [name]. Past Medical History:  Diagnosis Date  . CAD (coronary artery disease)    a. s/p prior PCI. b. CABG 2007 at [facility] in [place] 2007. c. inferior STEMI 10/2015 s/p DES to dSVG-PDA.  Marland Kitchen Cervical cancer ([place])   . Chronic diastolic CHF (congestive heart failure) ([place])   . Chronic respiratory failure ([place])    s/p tracheostomy 2002  . Chronic RUQ [name]   . COPD (chronic obstructive pulmonary disease) ([place])   . Diabetes mellitus ([place])   . DVT (deep venous thrombosis) ([place])   . Endometriosis   . History of gallstones 01/2016   seen on Ultrasound  . History of HIDA scan 11/2016   normal  . HTN (hypertension)   . Hyperlipidemia   . Lupus anticoagulant disorder (HCC)    on coumadin  . Morbid obesity ([place])   . ST elevation (STEMI) myocardial infarction involving right coronary artery ([place]) 10/29/15   stent to VG to PDA  . Tracheostomy in place [facility]), chronic since 2002 11/03/2015     Patient Active Problem List   Diagnosis Date Noted  . Acute on chronic respiratory failure with hypoxia ([place]) 08/01/2017  . Uncontrolled type 2 diabetes mellitus with hyperglycemia, with long-term current use of insulin ([place]) 08/01/2017  . Acute on chronic diastolic CHF (congestive heart failure) ([place]) 08/01/2017  . Dyspnea 07/31/2017  . Diastolic heart failure ([place]) 07/31/2017  . RUQ [name] 12/21/2016  . Nausea & vomiting   . CHF (congestive heart failure) ([place])   . Acute respiratory failure with hypoxia ([place]) 01/22/2016  . Type 2 diabetes mellitus with hyperglycemia ([place]) 01/22/2016  . CAP (community acquired pneumonia) 01/22/2016  . Tracheostomy status ([place])   . CAD (coronary artery disease) of artery bypass graft: PTCA/DES to distal body VG to PDA 10/29/15 11/03/2015  . Acute respiratory failure with hypoxemia ([place]) 11/03/2015  . Tracheostomy in place [facility]), chronic since 2002 11/03/2015  . Hypokalemia 11/03/2015  . Lupus anticoagulant disorder ([place])   . Chronic diastolic CHF (congestive heart failure) ([place])   . Respiratory failure ([place])   . Coronary artery disease involving coronary bypass graft of native heart without angina pectoris   . OSA (obstructive sleep apnea)   . COPD exacerbation ([place])   . ST elevation myocardial infarction (STEMI) of inferior wall ([place])  10/29/2015  . Acute ST elevation myocardial infarction (STEMI) involving right coronary artery ([place])   . Tobacco abuse   . DM (diabetes mellitus) ([place]) 06/11/2009  . Hyperlipidemia LDL goal <70 06/11/2009  . DVT 06/11/2009  . CORONARY ARTERY BYPASS GRAFT, HX OF 06/11/2009  . Essential hypertension 05/27/2009  . CAD, NATIVE VESSEL 05/27/2009    Past Surgical History:  Procedure Laterality Date  . CARDIAC CATHETERIZATION N/A 10/29/2015   Procedure: Left Heart Cath and Cors/Grafts Angiography;  Surgeon: Burnell Blanks, MD;  Location: [place] [facility];  Service: Cardiovascular;  Laterality: N/A;  .  CARDIAC CATHETERIZATION  10/29/2015   Procedure: Coronary Stent Intervention;  Surgeon: Burnell Blanks, MD;  Location: [place] [facility];  Service: Cardiovascular;;  . CAROTID STENT    . CESAREAN SECTION WITH BILATERAL TUBAL LIGATION    . CORONARY ARTERY BYPASS GRAFT  2007   2V  . TRACHEOSTOMY       OB History   None      Home Medications    Prior to Admission medications   Medication Sig Start Date End Date Taking? Authorizing Provider  albuterol (PROVENTIL HFA;VENTOLIN HFA) 108 (90 Base) MCG/ACT inhaler Inhale 1-2 puffs into the lungs every 6 (six) hours as needed for wheezing or shortness of breath.   Yes [provider]  albuterol (PROVENTIL) (2.5 MG/3ML) 0.083% nebulizer solution Take 3 mLs (2.5 mg total) by nebulization every 4 (four) hours as needed for wheezing or shortness of breath. 11/17/15  Yes Ollis, Brandi L, NP  clopidogrel (PLAVIX) 75 MG tablet TAKE 1 TABLET BY MOUTH ONCE DAILY **NEED  OFFICE  VISIT** 04/06/17  Yes [name], MD  furosemide (LASIX) 40 MG tablet Take 1 tablet (40 mg total) by mouth 2 (two) times daily. 08/04/17  Yes Eber Jones, MD  gabapentin (NEURONTIN) 300 MG capsule Take 300-600 mg by mouth 2 (two) times daily. 12/31/15  Yes [provider]  insulin degludec (TRESIBA FLEXTOUCH) 100 UNIT/ML SOPN FlexTouch Pen Inject 25 Units into the skin daily at 10 pm.   Yes [provider]  lisinopril (PRINIVIL,ZESTRIL) 20 MG tablet TAKE 1 TABLET BY MOUTH ONCE DAILY 03/15/18  Yes [name], MD  metoprolol (LOPRESSOR) 50 MG tablet Take 50 mg by mouth 2 (two) times daily.   Yes [provider]  nitroGLYCERIN (NITROSTAT) 0.4 MG SL tablet Place 0.4 mg under the tongue every 5 (five) minutes as needed for chest [name].   Yes [provider]  pantoprazole (PROTONIX) 40 MG tablet Take 40 mg by mouth daily.   Yes [provider]  warfarin (COUMADIN) 5 MG tablet Take 2.5-5 mg by mouth See admin  instructions. 5mg  on Monday and Friday; 2.5 mg rest of the days   Yes [provider]  rosuvastatin (CRESTOR) 5 MG tablet Take 5 mg by mouth daily. 03/08/18   [provider]    Family History Family History  Problem Relation Age of Onset  . Hypertension Mother   . Diabetes Mother   . Hypertension Father   . Diabetes Father     Social History Social History   Tobacco Use  . Smoking status: Current Every Day Smoker    Packs/day: 0.75    Types: Cigarettes    Start date: 10/23/1978  . Smokeless tobacco: Never Used  Substance Use Topics  . Alcohol use: No    Alcohol/week: 0.0 oz  . Drug use: No     Allergies   Penicillins; Ciprofloxacin; and  Ibuprofen   Review of Systems Review of Systems  Constitutional: Negative for fever.  HENT: Negative for congestion.   Respiratory: Positive for shortness of breath.   Cardiovascular: Positive for chest [name] and leg swelling.  Gastrointestinal: Negative for abdominal [name].  Genitourinary: Negative for flank [name].  Musculoskeletal: Negative for back [name].  Neurological: Negative for tremors.  Hematological: Negative for adenopathy.  Psychiatric/Behavioral: Negative for confusion.     Physical Exam Updated Vital Signs BP (!) 157/109   Pulse 77   Temp 99 F (37.2 C) (Oral)   Resp (!) 26   Ht 4\' 11"  (1.499 m)   Wt 90.7 kg (200 lb)   SpO2 92%   BMI 40.40 kg/m   Physical Exam  Constitutional: She appears well-developed.  HENT:  Head: Atraumatic.  Neck: Normal range of motion.  Patient has trach.  Cardiovascular: Normal rate and normal pulses.  Pulmonary/Chest: Effort normal.  Rales bilateral bases.  Musculoskeletal:       Right lower leg: She exhibits edema.       Left lower leg: She exhibits edema.  Neurological: She is alert.  Skin: Skin is warm. Capillary refill takes less than 2 seconds.     ED Treatments / Results  Labs (all labs ordered are listed, but only abnormal results are  displayed) Labs Reviewed  BASIC METABOLIC PANEL - Abnormal; Notable for the following components:      Result Value   Glucose, Bld 105 (*)    Calcium 8.6 (*)    All other components within normal limits  CBC - Abnormal; Notable for the following components:   RDW 16.7 (*)    All other components within normal limits  PROTIME-INR - Abnormal; Notable for the following components:   Prothrombin Time 19.2 (*)    All other components within normal limits  BRAIN NATRIURETIC PEPTIDE - Abnormal; Notable for the following components:   B Natriuretic Peptide 1,049.0 (*)    All other components within normal limits  I-STAT TROPONIN, ED    EKG EKG Interpretation  Date/Time:  Wednesday March 22 2018 14:15:08 EDT Ventricular Rate:  79 PR Interval:    QRS Duration: 98 QT Interval:  388 QTC Calculation: 445 R Axis:   89 Text Interpretation:  Sinus rhythm Probable left atrial enlargement Low voltage with right axis deviation Nonspecific T abnormalities, lateral leads Confirmed by Davonna Belling [phone]) on 03/22/2018 2:23:52 PM   Radiology Dg Chest 2 View  Result Date: 03/22/2018 CLINICAL DATA:  Initial evaluation for acute chest [name]. EXAM: CHEST - 2 VIEW COMPARISON:  Prior radiograph from 11/05/2017 FINDINGS: Median sternotomy wires underlying CABG markers. Moderate cardiomegaly. Coronary stent noted. Mediastinal silhouette within normal limits. Tracheostomy tube in place overlying the upper airway, tip well positioned 1.9 cm above the carina. Lungs hypoinflated. Suture material noted at the peripheral right lung base. Elevation of the lateral hemidiaphragms bilaterally, similar to previous, which could be chronic and/or reflect small sub pulmonic effusions. Small amount of fluid trapped along the right minor fissure. Diffuse vascular congestion with interstitial prominence, suggesting mild pulmonary interstitial edema. Few slightly more nodular densities overlie the peripheral right upper lobe,  which may reflect edema and/or atelectasis. Possible superimposed infection could be considered in the correct clinical setting. No acute osseous abnormality. IMPRESSION: 1. Cardiomegaly with mild diffuse pulmonary interstitial edema, suggesting CHF. Possible small pleural effusions. 2. Few scattered nodular densities overlying the peripheral right upper lobe, indeterminate. While these findings may reflect edema and/or atelectatic changes, possible superimposed infection  could be considered in the correct clinical setting. Radiographic follow-up to resolution recommended to ensure no underlying pulmonary nodule present, in which case cross-sectional imaging would be recommended for further evaluation. Electronically Signed   By: Jeannine Boga M.D.   On: 03/22/2018 15:53    Procedures Procedures (including critical care time)  Medications Ordered in ED Medications  isosorbide mononitrate (IMDUR) 24 hr tablet 30 mg (has no administration in time range)  furosemide (LASIX) injection 80 mg (has no administration in time range)     Initial Impression / Assessment and Plan / ED Course  I have reviewed the triage vital signs and the nursing notes.  Pertinent labs & imaging results that were available during my care of the patient were reviewed by me and considered in my medical decision making (see chart for details).     Patient with unstable angina and CHF.  Started on Imdur and given Lasix.  She is on Coumadin and per cardiology plan heparin will be started.  Plan will be for eventual heart catheterization but cardiology thinks patient would benefit from admission to [facility] and they can help facilitate a transfer later to get the cath.  Patient is [name]-free at this time.    CRITICAL CARE Performed by: Davonna Belling Total critical care time: 30 minutes Critical care time was exclusive of separately billable procedures and treating other patients. Critical care was  necessary to treat or prevent imminent or life-threatening deterioration. Critical care was time spent personally by me on the following activities: development of treatment plan with patient and/or surrogate as well as nursing, discussions with consultants, evaluation of patient's response to treatment, examination of patient, obtaining history from patient or surrogate, ordering and performing treatments and interventions, ordering and review of laboratory studies, ordering and review of radiographic studies, pulse oximetry and re-evaluation of patient's condition.   Final Clinical Impressions(s) / ED Diagnoses   Final diagnoses:  Unstable angina ([place])  Acute on chronic congestive heart failure, unspecified heart failure type [facility])    ED Discharge Orders    None       Davonna Belling, MD 03/22/18 1606

## 2018-03-22 NOTE — H&P (Signed)
History and Physical    Theresa [name]:[id number] DOB: 1966-03-10 DOA: 03/22/2018  PCP: Monico Blitz, MD  Patient coming from: Home  Chief Complaint: Chest pain  HPI: Theresa [name] is a 52 y.o. female with medical history significant of trach dependent, chronic diastolic congestive heart failure, coronary artery disease, diabetes, VTE has been having several weeks of substernal chest pain without any radiation occurring mostly with exertion seen with cardiology service as an outpatient today for follow-up who directly sent her to the emergency department for further evaluation for underlying unstable angina.  Patient reports swelling to her legs.  She reports worsening shortness of breath.  She is not compliant with her Lasix skips a dosing every other day.  She denies any fevers.  She is currently chest pain-free.  Patient is being referred for admission for acute on chronic congestive heart failure in the setting of likely needing a heart catheterization at some point.  Cardiology service will be seeing her in the morning.  Review of Systems: As per HPI otherwise 10 point review of systems negative.   Past Medical History:  Diagnosis Date  . CAD (coronary artery disease)    a. s/p prior PCI. b. CABG 2007 at [facility] in [place] 2007. c. inferior STEMI 10/2015 s/p DES to dSVG-PDA.  Marland Kitchen Cervical cancer ([place])   . Chronic diastolic CHF (congestive heart failure) ([place])   . Chronic respiratory failure ([place])    s/p tracheostomy 2002  . Chronic RUQ pain   . COPD (chronic obstructive pulmonary disease) ([place])   . Diabetes mellitus ([place])   . DVT (deep venous thrombosis) ([place])   . Endometriosis   . History of gallstones 01/2016   seen on Ultrasound  . History of HIDA scan 11/2016   normal  . HTN (hypertension)   . Hyperlipidemia   . Lupus anticoagulant disorder (HCC)    on coumadin  . Morbid obesity ([place])   . ST elevation (STEMI) myocardial infarction involving right coronary  artery ([place]) 10/29/15   stent to VG to PDA  . Tracheostomy in place [facility]), chronic since 2002 11/03/2015    Past Surgical History:  Procedure Laterality Date  . CARDIAC CATHETERIZATION N/A 10/29/2015   Procedure: Left Heart Cath and Cors/Grafts Angiography;  Surgeon: Burnell Blanks, MD;  Location: [place] [facility];  Service: Cardiovascular;  Laterality: N/A;  . CARDIAC CATHETERIZATION  10/29/2015   Procedure: Coronary Stent Intervention;  Surgeon: Burnell Blanks, MD;  Location: [place] [facility];  Service: Cardiovascular;;  . CAROTID STENT    . CESAREAN SECTION WITH BILATERAL TUBAL LIGATION    . CORONARY ARTERY BYPASS GRAFT  2007   2V  . TRACHEOSTOMY       reports that she has been smoking cigarettes.  She started smoking about 39 years ago. She has been smoking about 0.75 packs per day. She has never used smokeless tobacco. She reports that she does not drink alcohol or use drugs.  Allergies  Allergen Reactions  . Penicillins Rash    Has patient had a PCN reaction causing immediate rash, facial/tongue/throat swelling, SOB or lightheadedness with hypotension: Yes Has patient had a PCN reaction causing severe rash involving mucus membranes or skin necrosis: No Has patient had a PCN reaction that required hospitalization No Has patient had a PCN reaction occurring within the last 10 years: No If all of the above answers are "NO", then may proceed with Cephalosporin use.   REACTION: rash  . Ciprofloxacin  nausea  . Ibuprofen Rash    swelling in leg     Family History  Problem Relation Age of Onset  . Hypertension Mother   . Diabetes Mother   . Hypertension Father   . Diabetes Father     Prior to Admission medications   Medication Sig Start Date End Date Taking? Authorizing Provider  albuterol (PROVENTIL HFA;VENTOLIN HFA) 108 (90 Base) MCG/ACT inhaler Inhale 1-2 puffs into the lungs every 6 (six) hours as needed for wheezing or shortness of breath.   Yes  [provider]  albuterol (PROVENTIL) (2.5 MG/3ML) 0.083% nebulizer solution Take 3 mLs (2.5 mg total) by nebulization every 4 (four) hours as needed for wheezing or shortness of breath. 11/17/15  Yes Ollis, Brandi L, NP  clopidogrel (PLAVIX) 75 MG tablet TAKE 1 TABLET BY MOUTH ONCE DAILY **NEED  OFFICE  VISIT** 04/06/17  Yes [name], MD  furosemide (LASIX) 40 MG tablet Take 1 tablet (40 mg total) by mouth 2 (two) times daily. 08/04/17  Yes Eber Jones, MD  gabapentin (NEURONTIN) 300 MG capsule Take 300-600 mg by mouth 2 (two) times daily. 12/31/15  Yes [provider]  insulin degludec (TRESIBA FLEXTOUCH) 100 UNIT/ML SOPN FlexTouch Pen Inject 25 Units into the skin daily at 10 pm.   Yes [provider]  lisinopril (PRINIVIL,ZESTRIL) 20 MG tablet TAKE 1 TABLET BY MOUTH ONCE DAILY 03/15/18  Yes [name], MD  metoprolol (LOPRESSOR) 50 MG tablet Take 50 mg by mouth 2 (two) times daily.   Yes [provider]  nitroGLYCERIN (NITROSTAT) 0.4 MG SL tablet Place 0.4 mg under the tongue every 5 (five) minutes as needed for chest pain.   Yes [provider]  pantoprazole (PROTONIX) 40 MG tablet Take 40 mg by mouth daily.   Yes [provider]  warfarin (COUMADIN) 5 MG tablet Take 2.5-5 mg by mouth See admin instructions. 5mg  on Monday and Friday; 2.5 mg rest of the days   Yes [provider]  rosuvastatin (CRESTOR) 5 MG tablet Take 5 mg by mouth daily. 03/08/18   [provider]    Physical Exam: Vitals:   03/22/18 1418 03/22/18 1430 03/22/18 1500 03/22/18 1530  BP: 135/84 (!) 143/87 136/82 (!) 157/109  Pulse: 78 73 77 77  Resp: 20 (!) 24 17 (!) 26  Temp: 99 F (37.2 C)     TempSrc: Oral     SpO2: 90% 93% 91% 92%  Weight:      Height:          Constitutional: NAD, calm, comfortable obese Vitals:   03/22/18 1418 03/22/18 1430 03/22/18 1500 03/22/18 1530  BP: 135/84 (!) 143/87 136/82 (!) 157/109  Pulse: 78  73 77 77  Resp: 20 (!) 24 17 (!) 26  Temp: 99 F (37.2 C)     TempSrc: Oral     SpO2: 90% 93% 91% 92%  Weight:      Height:       Eyes: PERRL, lids and conjunctivae normal ENMT: Mucous membranes are moist. Posterior pharynx clear of any exudate or lesions.Normal dentition.  Neck: normal, supple, no masses, no thyromegaly Respiratory: clear to auscultation bilaterally, no wheezing, no crackles. Normal respiratory effort. No accessory muscle use.  Cardiovascular: Regular rate and rhythm, no murmurs / rubs / gallops.  1+ extremity edema. 2+ pedal pulses. No carotid bruits.  Abdomen: no tenderness, no masses palpated. No hepatosplenomegaly. Bowel sounds positive.  Musculoskeletal: no clubbing / cyanosis. No joint deformity upper  and lower extremities. Good ROM, no contractures. Normal muscle tone.  Skin: no rashes, lesions, ulcers. No induration Neurologic: CN 2-12 grossly intact. Sensation intact, DTR normal. Strength 5/5 in all 4.  Psychiatric: Normal judgment and insight. Alert and oriented x 3. Normal mood.    Labs on Admission: I have personally reviewed following labs and imaging studies  CBC: Recent Labs  Lab 03/22/18 1434  WBC 5.1  HGB 14.0  HCT 41.8  MCV 95.4  PLT 751   Basic Metabolic Panel: Recent Labs  Lab 03/22/18 1434  NA 138  K 3.7  CL 102  CO2 28  GLUCOSE 105*  BUN 10  CREATININE 0.68  CALCIUM 8.6*   GFR: Estimated Creatinine Clearance: 80.8 mL/min (by C-G formula based on SCr of 0.68 mg/dL). Liver Function Tests: No results for input(s): AST, ALT, ALKPHOS, BILITOT, PROT, ALBUMIN in the last 168 hours. No results for input(s): LIPASE, AMYLASE in the last 168 hours. No results for input(s): AMMONIA in the last 168 hours. Coagulation Profile: Recent Labs  Lab 03/22/18 1434  INR 1.63   Cardiac Enzymes: No results for input(s): CKTOTAL, CKMB, CKMBINDEX, TROPONINI in the last 168 hours. BNP (last 3 results) No results for input(s): PROBNP in the  last 8760 hours. HbA1C: No results for input(s): HGBA1C in the last 72 hours. CBG: No results for input(s): GLUCAP in the last 168 hours. Lipid Profile: No results for input(s): CHOL, HDL, LDLCALC, TRIG, CHOLHDL, LDLDIRECT in the last 72 hours. Thyroid Function Tests: No results for input(s): TSH, T4TOTAL, FREET4, T3FREE, THYROIDAB in the last 72 hours. Anemia Panel: No results for input(s): VITAMINB12, FOLATE, FERRITIN, TIBC, IRON, RETICCTPCT in the last 72 hours. Urine analysis:    Component Value Date/Time   COLORURINE AMBER (A) 07/31/2017 1810   APPEARANCEUR HAZY (A) 07/31/2017 1810   LABSPEC >1.046 (H) 07/31/2017 1810   PHURINE 5.0 07/31/2017 1810   GLUCOSEU 50 (A) 07/31/2017 1810   HGBUR SMALL (A) 07/31/2017 1810   BILIRUBINUR NEGATIVE 07/31/2017 1810   KETONESUR NEGATIVE 07/31/2017 1810   PROTEINUR >=300 (A) 07/31/2017 1810   NITRITE NEGATIVE 07/31/2017 1810   LEUKOCYTESUR NEGATIVE 07/31/2017 1810   Sepsis Labs: !!!!!!!!!!!!!!!!!!!!!!!!!!!!!!!!!!!!!!!!!!!! @LABRCNTIP (procalcitonin:4,lacticidven:4) )No results found for this or any previous visit (from the past 240 hour(s)).   Radiological Exams on Admission: Dg Chest 2 View  Result Date: 03/22/2018 CLINICAL DATA:  Initial evaluation for acute chest pain. EXAM: CHEST - 2 VIEW COMPARISON:  Prior radiograph from 11/05/2017 FINDINGS: Median sternotomy wires underlying CABG markers. Moderate cardiomegaly. Coronary stent noted. Mediastinal silhouette within normal limits. Tracheostomy tube in place overlying the upper airway, tip well positioned 1.9 cm above the carina. Lungs hypoinflated. Suture material noted at the peripheral right lung base. Elevation of the lateral hemidiaphragms bilaterally, similar to previous, which could be chronic and/or reflect small sub pulmonic effusions. Small amount of fluid trapped along the right minor fissure. Diffuse vascular congestion with interstitial prominence, suggesting mild pulmonary  interstitial edema. Few slightly more nodular densities overlie the peripheral right upper lobe, which may reflect edema and/or atelectasis. Possible superimposed infection could be considered in the correct clinical setting. No acute osseous abnormality. IMPRESSION: 1. Cardiomegaly with mild diffuse pulmonary interstitial edema, suggesting CHF. Possible small pleural effusions. 2. Few scattered nodular densities overlying the peripheral right upper lobe, indeterminate. While these findings may reflect edema and/or atelectatic changes, possible superimposed infection could be considered in the correct clinical setting. Radiographic follow-up to resolution recommended to ensure no underlying pulmonary nodule present,  in which case cross-sectional imaging would be recommended for further evaluation. Electronically Signed   By: Jeannine Boga M.D.   On: 03/22/2018 15:53    EKG: Independently reviewed.  Normal sinus rhythm nonspecific T wave changes  Old chart reviewed  Case discussed with Dr. Alvino Chapel in the ED  Chest x-ray reviewed pulmonary edema noted small bilateral pleural effusions no focal infiltrate  Assessment/Plan 52 year old female with acute on chronic congestive heart failure with unstable angina Principal Problem:   Unstable angina (HCC)-serial cardiac enzymes.  Obtain cardiac echo in the morning.  Cardiology consulted and will see in the morning.  Will likely need heart catheterization at some point when she is diuresed.  Initial troponin normal with a nonischemic EKG.  Holding Coumadin placing on heparin drip in preparation for eventual heart catheterization.  Active Problems:   Acute on chronic diastolic CHF (congestive heart failure) (HCC)-placed on Lasix 40 mill grams IV every 12 hours.  Mild exacerbation.    CAD (coronary artery disease) of artery bypass graft: PTCA/DES to distal body VG to PDA 10/29/15-noted    Tracheostomy in place [facility]), chronic since 2002-stable     Uncontrolled type 2 diabetes mellitus with hyperglycemia, with long-term current use of insulin (HCC)-sliding scale insulin.  Check hemoglobin A1c.     DVT prophylaxis: Heparin drip holding Coumadin Code Status: Full Family Communication: None Disposition Plan: Days Consults called: Cardiology Admission status: Admission   DAVID,RACHAL A MD [facility]  If 7PM-7AM, please contact night-coverage [email or web address] Password [facility]  03/22/2018, 4:23 PM

## 2018-03-22 NOTE — Patient Instructions (Signed)
For patients with congestive heart failure, we give them these special instructions:  1. Follow a low-salt diet - you are allowed no more than 2,000mg  of sodium per day. Watch your fluid intake. In general, you should not be taking in more than 2 liters of fluid per day (no more than 8 glasses per day). This includes sources of water in foods like soup, coffee, tea, milk, etc. 2. Weigh yourself on the same scale at same time of day and keep a log. 3. Call your doctor: (Anytime you feel any of the following symptoms)  - 3lb weight gain overnight or 5lb within a few days - Shortness of breath, with or without a dry hacking cough  - Swelling in the hands, feet or stomach  - If you have to sleep on extra pillows at night in order to breathe   IT IS IMPORTANT TO LET YOUR DOCTOR KNOW EARLY ON IF YOU ARE HAVING SYMPTOMS SO WE CAN HELP YOU!

## 2018-03-22 NOTE — ED Triage Notes (Signed)
Pt brought to ER by [facility] heart Care staff with note stating pt has cardiac history and came in office today for follow up about chest pain that happened a few weeks ago concerning for angina and possible MI.  Reports since then has had daily exertional chest burning.  States pt has evidence of acute on chronic diastolic CHF.  They sent pt to ER for treatment of chf and unstable angina.  Recommended IV lasix, hold coumadin, and start heparin per pharmacy and adding nitrate therapy.  [place] team will follow up in the morning and determine timing of [facility] for [facility].  Per Melina Copa PA-C  Pt alert and oriented.  Pt has trache and is on home o2.  Pt ambulatory in room and to restroom.  Pt denies any pain at present.

## 2018-03-22 NOTE — ED Notes (Signed)
Report given to 300 RN at this time.  

## 2018-03-23 ENCOUNTER — Other Ambulatory Visit (HOSPITAL_COMMUNITY): Payer: Medicare Other

## 2018-03-23 DIAGNOSIS — I200 Unstable angina: Secondary | ICD-10-CM

## 2018-03-23 DIAGNOSIS — J9621 Acute and chronic respiratory failure with hypoxia: Secondary | ICD-10-CM

## 2018-03-23 LAB — GLUCOSE, CAPILLARY
GLUCOSE-CAPILLARY: 79 mg/dL (ref 70–99)
GLUCOSE-CAPILLARY: 147 mg/dL — AB (ref 70–99)
Glucose-Capillary: 200 mg/dL — ABNORMAL HIGH (ref 70–99)
Glucose-Capillary: 146 mg/dL — ABNORMAL HIGH (ref 70–99)

## 2018-03-23 LAB — BASIC METABOLIC PANEL
Anion gap: 10 (ref 5–15)
BUN: 10 mg/dL (ref 6–20)
CHLORIDE: 100 mmol/L (ref 98–111)
CO2: 28 mmol/L (ref 22–32)
Calcium: 8.4 mg/dL — ABNORMAL LOW (ref 8.9–10.3)
Creatinine, Ser: 0.96 mg/dL (ref 0.44–1.00)
GFR calc Af Amer: >60 mL/min (ref >60)
GFR calc non Af Amer: >60 mL/min (ref >60)
GLUCOSE: 168 mg/dL — AB (ref 70–99)
POTASSIUM: 3 mmol/L — AB (ref 3.5–5.1)
Sodium: 138 mmol/L (ref 135–145)

## 2018-03-23 LAB — CBC
HEMATOCRIT: 41.8 % (ref 36.0–46.0)
Hemoglobin: 13.7 g/dL (ref 12.0–15.0)
MCH: 31.4 pg (ref 26.0–34.0)
MCHC: 32.8 g/dL (ref 30.0–36.0)
MCV: 95.9 fL (ref 78.0–100.0)
Platelets: 164 10*3/uL (ref 150–400)
RBC: 4.36 MIL/uL (ref 3.87–5.11)
RDW: 16.5 % — ABNORMAL HIGH (ref 11.5–15.5)
WBC: 5.8 10*3/uL (ref 4.0–10.5)

## 2018-03-23 LAB — HEPARIN LEVEL (UNFRACTIONATED): Heparin Unfractionated: 0.2 IU/mL — ABNORMAL LOW (ref 0.30–0.70)

## 2018-03-23 LAB — PROTIME-INR
INR: 1.6
Prothrombin Time: 18.9 seconds — ABNORMAL HIGH (ref 11.4–15.2)

## 2018-03-23 LAB — MRSA PCR SCREENING: MRSA BY PCR: NEGATIVE

## 2018-03-23 MED ORDER — SODIUM CHLORIDE 0.9% FLUSH
3.0000 mL | Freq: Two times a day (BID) | INTRAVENOUS | Status: DC
  Administered 2018-03-23 – 2018-03-28 (×3): 3 mL via INTRAVENOUS

## 2018-03-23 MED ORDER — INSULIN GLARGINE 100 UNIT/ML ~~LOC~~ SOLN
25.0000 [IU] | Freq: Every day | SUBCUTANEOUS | Status: DC
  Administered 2018-03-24 – 2018-03-28 (×5): 25 [IU] via SUBCUTANEOUS
  Filled 2018-03-23 (×6): qty 0.25

## 2018-03-23 MED ORDER — SODIUM CHLORIDE 0.9 % IV SOLN: 250.0000 mL | INTRAVENOUS | Status: DC | PRN

## 2018-03-23 MED ORDER — ASPIRIN 81 MG PO CHEW
81.0000 mg | CHEWABLE_TABLET | Freq: Once | ORAL | Status: AC
  Administered 2018-03-23: 81 mg via ORAL
  Filled 2018-03-23: qty 1

## 2018-03-23 MED ORDER — SODIUM CHLORIDE 0.9 % IV SOLN
INTRAVENOUS | Status: DC
  Administered 2018-03-24: 05:00:00 via INTRAVENOUS

## 2018-03-23 MED ORDER — ASPIRIN EC 81 MG PO TBEC
81.0000 mg | DELAYED_RELEASE_TABLET | Freq: Every day | ORAL | Status: DC
  Administered 2018-03-24 – 2018-03-29 (×6): 81 mg via ORAL
  Filled 2018-03-23 (×6): qty 1

## 2018-03-23 MED ORDER — SODIUM CHLORIDE 0.9% FLUSH: 3.0000 mL | INTRAVENOUS | Status: DC | PRN

## 2018-03-23 MED ORDER — INSULIN GLARGINE 100 UNIT/ML ~~LOC~~ SOLN
12.0000 [IU] | Freq: Every day | SUBCUTANEOUS | Status: AC
  Administered 2018-03-23: 12 [IU] via SUBCUTANEOUS
  Filled 2018-03-23 (×2): qty 0.12

## 2018-03-23 MED ORDER — ASPIRIN 81 MG PO CHEW
81.0000 mg | CHEWABLE_TABLET | ORAL | Status: AC
  Administered 2018-03-24: 81 mg via ORAL
  Filled 2018-03-23: qty 1

## 2018-03-23 MED ORDER — POTASSIUM CHLORIDE ER 10 MEQ PO TBCR
40.0000 meq | EXTENDED_RELEASE_TABLET | Freq: Once | ORAL | Status: AC
  Administered 2018-03-23: 40 meq via ORAL
  Filled 2018-03-23 (×2): qty 4

## 2018-03-23 MED ORDER — FUROSEMIDE 10 MG/ML IJ SOLN
40.0000 mg | Freq: Once | INTRAMUSCULAR | Status: AC
  Administered 2018-03-23: 40 mg via INTRAVENOUS
  Filled 2018-03-23: qty 4

## 2018-03-23 MED ORDER — LIVING BETTER WITH HEART FAILURE BOOK: Freq: Once | Status: DC

## 2018-03-23 MED ORDER — POTASSIUM CHLORIDE ER 10 MEQ PO TBCR
40.0000 meq | EXTENDED_RELEASE_TABLET | Freq: Once | ORAL | Status: AC
  Administered 2018-03-23: 40 meq via ORAL
  Filled 2018-03-23: qty 4

## 2018-03-23 NOTE — Progress Notes (Addendum)
INR 1.6. Pt did receive full dose insulin last night and had breakfast/lunch this PM (CBG 140) so will plan to transferring to [facility] for cath tomorrow. Ordered 1/2 dose insulin for tonight, to resume full dose tomorrow night. Spoke with pt and family. Pre-cath orders in. She got Lasix this AM and was diuresing well when I spoke with her - given need for cath will hold off further Lasix/pre-cath IVF and see what cath shows. Should be NPO after midnight except sips with meds, and clears til 5am per new protocol. Cardiology will take on our service @ [facility]. Spoke with Dr. Clementeen Graham. Trish notified and will assist with [facility].  Ajah Vanhoose PA-C

## 2018-03-23 NOTE — Progress Notes (Signed)
Patient O2 sats decreased to 81-84% while eating breakfast.  O2 increased to 6L [place] and sats increased to 87-88% only.  Spoke with RT, decision made to switch to trach collar.  MD was made aware.  Patient not appearing to be in any distress, does not report and increase in SOB.  O2 sats have improved since switching from .  Will continue to monitor.

## 2018-03-23 NOTE — Progress Notes (Addendum)
Progress Note  Patient Name: Theresa [name] Date of Encounter: 03/23/2018  Primary Cardiologist: Theresa Breeding, MD  Subjective   No CP overnight. Good UOP. LEE improved but not resolved. Had some orthopnea overnight which improved with further diuresis. Laid her back and she had no orthopnea.  Inpatient Medications    Scheduled Meds: . clopidogrel  75 mg Oral Daily  . furosemide  40 mg Intravenous Q12H  . gabapentin  300-600 mg Oral BID  . insulin aspart  0-9 Units Subcutaneous TID WC  . insulin glargine  25 Units Subcutaneous Q2200  . lisinopril  20 mg Oral Daily  . mouth rinse  15 mL Mouth Rinse BID  . metoprolol tartrate  50 mg Oral BID  . pantoprazole  40 mg Oral Daily  . rosuvastatin  5 mg Oral Daily   Continuous Infusions: . heparin 1,100 Units/hr (03/23/18 0815)   PRN Meds: acetaminophen, albuterol, nitroGLYCERIN, ondansetron (ZOFRAN) IV   Vital Signs    Vitals:   03/23/18 0755 03/23/18 0820 03/23/18 0843 03/23/18 0907  BP:      Pulse: (!) 58  61 62  Resp: 14   18  Temp:      TempSrc:      SpO2: 94% (!) 87% 91% 97%  Weight:      Height:        Intake/Output Summary (Last 24 hours) at 03/23/2018 1036 Last data filed at 03/23/2018 0900 Gross per 24 hour  Intake 964.13 ml  Output 2000 ml  Net -1035.87 ml   Filed Weights   03/22/18 1413  Weight: 200 lb (90.7 kg)    Telemetry    NSR occ PVCs, one couplet, one 4 beat NSVT- Personally Reviewed  Physical Exam   GEN: No acute distress.  HEENT: Normocephalic, atraumatic, sclera non-icteric. Trache in place. Neck: No JVD or bruits. Cardiac: RRR no murmurs, rubs, or gallops.  Radials/DP/PT 1+ and equal bilaterally.  Respiratory: Clear to auscultation bilaterally. Breathing is unlabored on trache GI: Soft, nontender, non-distended, BS +x 4. MS: no deformity. Extremities: No clubbing or cyanosis. 2+ BLE edema with chronic skin thickening/scaling. Distal pedal pulses are 2+ and equal  bilaterally. Neuro:  AAOx3. Follows commands. Psych:  Responds to questions appropriately with a normal affect.  Labs    Chemistry Recent Labs  Lab 03/22/18 1434  NA 138  K 3.7  CL 102  CO2 28  GLUCOSE 105*  BUN 10  CREATININE 0.68  CALCIUM 8.6*  GFRNONAA >60  GFRAA >60  ANIONGAP 8     Hematology Recent Labs  Lab 03/22/18 1434 03/23/18 0607  WBC 5.1 5.8  RBC 4.38 4.36  HGB 14.0 13.7  HCT 41.8 41.8  MCV 95.4 95.9  MCH 32.0 31.4  MCHC 33.5 32.8  RDW 16.7* 16.5*  PLT 159 164    Cardiac Enzymes Recent Labs  Lab 03/22/18 1434 03/22/18 1930 03/22/18 2232  TROPONINI <0.03 <0.03 <0.03    Recent Labs  Lab 03/22/18 1434  TROPIPOC 0.01     BNP Recent Labs  Lab 03/22/18 1434  BNP 1,049.0*     DDimer No results for input(s): DDIMER in the last 168 hours.   Radiology    Dg Chest 2 View  Result Date: 03/22/2018 CLINICAL DATA:  Initial evaluation for acute chest pain. EXAM: CHEST - 2 VIEW COMPARISON:  Prior radiograph from 11/05/2017 FINDINGS: Median sternotomy wires underlying CABG markers. Moderate cardiomegaly. Coronary stent noted. Mediastinal silhouette within normal limits. Tracheostomy tube in place overlying  the upper airway, tip well positioned 1.9 cm above the carina. Lungs hypoinflated. Suture material noted at the peripheral right lung base. Elevation of the lateral hemidiaphragms bilaterally, similar to previous, which could be chronic and/or reflect small sub pulmonic effusions. Small amount of fluid trapped along the right minor fissure. Diffuse vascular congestion with interstitial prominence, suggesting mild pulmonary interstitial edema. Few slightly more nodular densities overlie the peripheral right upper lobe, which may reflect edema and/or atelectasis. Possible superimposed infection could be considered in the correct clinical setting. No acute osseous abnormality. IMPRESSION: 1. Cardiomegaly with mild diffuse pulmonary interstitial edema,  suggesting CHF. Possible small pleural effusions. 2. Few scattered nodular densities overlying the peripheral right upper lobe, indeterminate. While these findings may reflect edema and/or atelectatic changes, possible superimposed infection could be considered in the correct clinical setting. Radiographic follow-up to resolution recommended to ensure no underlying pulmonary nodule present, in which case cross-sectional imaging would be recommended for further evaluation. Electronically Signed   By: Jeannine Boga M.D.   On: 03/22/2018 15:53   Patient Profile     52 y.o. female with CAD (s/p prior PCI, CABG 2007 at [facility] in [place] 2007, inferior STEMI 10/2015 s/p DES to dSVG-PDA), chronic diastolic CHF, morbid obesity, COPD, DM, HTN, HLD, lupus anticoagulant, DVT on chronic anticoagulation, cervical cancer, endometriosis, chronic respiratory failure with trachestomy 2002. Recent multiple readmissions to [facility] for CHF per patient (but not taking Lasix every day as prescribed). She presented to the office for eval of CP. A few weeks ago had severe SSCP with diaphoresis, nausea, vomiting x 20 minutes - felt like MI - did not seek care. Since that time, has had daily exertional chest burning and dyspnea. Also with evidence of significant LEE on exam/acute on chronic diastolic CHF.   Assessment & Plan    1. CAD with symptoms suggestive of unstable angina - r/o for MI. Coumadin on hold, on heparin per pharmacy. Will go ahead and start aspirin today. Anticipate transfer to [facility] for cath. Risks and benefits of cardiac catheterization have been discussed with the patient.  These include bleeding, infection, kidney damage, stroke, heart attack, death.  The patient understands these risks and is willing to proceed. Will clarify with MD if OK to proceed. Cath lab confirms there is room to do her today. No PT/INR ordered for today, will obtain stat with care order for nurse to call me with  result.  2. Acute on chronic diastolic CHF - inconsistent use of diuretic at home, likely leading to treatment failures. Reports multiple adm to [facility]. Intermittently would not take Lasix due to dysuria. Discussed consistent dosing which will be determined at DC. 2D echo pending for reassessment given multitude of issues this year. She is diuresing well. I will hold further Lasix today pending probable cath.  3. HTN - follow BP with diuresis. Normal this AM.  4. Hyperlipidemia- MAR clarifies she is on rosuvastatin at very low dose. Previously on atorvastatin 80mg  QPM. Check lipid profile and consider escalation as tolerated.  5. Chronic resp failure with trache - RT following here.  6. H/o DVT, lupus AC/thrombophilia - per DC summary in 2017 when she presented for STEMI, "We all feel that she needs aggressive anti-platelet therapy AND anti-coagulation in the setting of her acute STEMI and thrombophilia with lupus AC. Dr. Burt Knack and I feel that Kary Kos is relatively contraindicated with [facility] and that Plavix would be better agent. We discussed using asa 81, plavix and full-dose AC with  coumadin or a NOAC for 3 months and then dropping ASA. I also spoke to hematology who felt that based on [employer]-[employer] data we could use Apixaban 2.5 bid for ongoing prevention of DVT even with Lupus AC. (last DVT about 15 years ago)". Continue heparin fo rnow.   For questions or updates, please contact [place] Please consult [email or web address] for contact info under Cardiology/STEMI.  Signed, Charlie Pitter, PA-C 03/23/2018, 10:36 AM    Attending note Patient seen and discussed with PA Dunn, I agree with her documentation. Admitted with symptoms conerning for unstable angina as well as volume overload. Plans for cath pending INR. She is on coumadin for history of DVT and lupus anticoagulant, requires bridiging. Trops neg. She is negative 1.9 L thus far, SOB improved. Labs pending this AM. Pending INR would plan for  cath. Repeat echo pending.    Carlyle Dolly MD

## 2018-03-23 NOTE — Progress Notes (Signed)
[facility].  Patient and family aware.  Report given to Sam on 6E at [facility].  Patient stable at this time

## 2018-03-23 NOTE — Progress Notes (Signed)
[name] site is clean. Patient states that she cleans her trach site and her jackson trach flange herself. Stated that she has been taking care of it since year 2002. Informed patient that if any assistance or help is needed to please notified staff to inform RRT. Patient is stable at this time no distress or complications noted.

## 2018-03-23 NOTE — Progress Notes (Signed)
PROGRESS NOTE                                                                                                                                                                                                             Patient Demographics:    Theresa [name], is a 52 y.o. female, DOB - 05-Apr-1966, PNT:[id number]  Admit date - 03/22/2018   Admitting Physician Phillips Grout, MD  Outpatient Primary MD for the patient is Monico Blitz, MD  LOS - 1  Outpatient Specialists:   Chief Complaint  Patient presents with  . Chest Pain       Brief Narrative   52 year old female with extensive cardiac history with CABG in 2007, prior PCI, inferior ST EMI in 10/2015 status post DES, chronic diastolic CHF, morbid obesity, chronic respiratory failure status post trach (on 3 L nasal cannula at home), COPD, diabetes mellitus, lupus anticoagulant and DVT on chronic anti-correlation, hyperlipidemia, hypertension, history of cervical cancer admitted from cardiology office where she presented for chest pain and suspected to have unstable angina.   Subjective:   Patient denies any chest pain.  Desatted to mid 80s on 6 L via nasal cannula and placed on trach collar with improvement in her sat.   Assessment  & Plan :    Principal Problem:   Unstable angina (HCC) On heparin drip.  No chest pain symptoms at present.  Coumadin held.  Started on aspirin with cardiology planning on transferring patient to the service at [facility] for cardiac cath tomorrow.     Active Problems: Acute on chronic diastolic CHF ([place]) Suspected to have nonadherence to her diuretic regimen at home.  2D echo pending.  Further IV Lasix held for cardiac cath tomorrow.  Acute on chronic hypoxic respiratory failure (HCC) Possibly due to acute CHF On 3 L nasal cannula at home.  Has a trach and was placed on trach collar this morning with improved sats.  Essential  hypertension Stable.  Hyperlipidemia On low-dose statin.  Follow lipid panel     Uncontrolled type 2 diabetes mellitus with hyperglycemia, with long-term current use of insulin (HCC) CBG stable.  Continue home dose insulin and sliding scale coverage       Code Status : Full code  Family Communication  : None at bedside  Disposition Plan  : Transfer to [facility] under cardiology service for cardiac cath  Barriers For Discharge : Active symptoms  Consults  : Cardiology  Procedures  : Pending 2D echo  DVT Prophylaxis  : IV heparin   Lab Results  Component Value Date   PLT 164 03/23/2018    Antibiotics  :    Anti-infectives (From admission, onward)   None        Objective:   Vitals:   03/23/18 0755 03/23/18 0820 03/23/18 0843 03/23/18 0907  BP:      Pulse: (!) 58  61 62  Resp: 14   18  Temp:      TempSrc:      SpO2: 94% (!) 87% 91% 97%  Weight:      Height:        Wt Readings from Last 3 Encounters:  03/22/18 90.7 kg (200 lb)  03/22/18 90.7 kg (200 lb)  08/04/17 97.8 kg (215 lb 8 oz)     Intake/Output Summary (Last 24 hours) at 03/23/2018 1248 Last data filed at 03/23/2018 1230 Gross per 24 hour  Intake 964.13 ml  Output 3200 ml  Net -2235.87 ml     Physical Exam  Gen: not in distress HEENT: On trach collar, moist mucosa, supple neck Chest: Few scattered rhonchi CVS: N S1&S2, no murmurs, rubs or gallop GI: soft, NT, ND, BS+ Musculoskeletal: warm, no edema    Data Review:    CBC Recent Labs  Lab 03/22/18 1434 03/23/18 0607  WBC 5.1 5.8  HGB 14.0 13.7  HCT 41.8 41.8  PLT 159 164  MCV 95.4 95.9  MCH 32.0 31.4  MCHC 33.5 32.8  RDW 16.7* 16.5*    Chemistries  Recent Labs  Lab 03/22/18 1434  NA 138  K 3.7  CL 102  CO2 28  GLUCOSE 105*  BUN 10  CREATININE 0.68  CALCIUM 8.6*   ------------------------------------------------------------------------------------------------------------------ No results for input(s):  CHOL, HDL, LDLCALC, TRIG, CHOLHDL, LDLDIRECT in the last 72 hours.  Lab Results  Component Value Date   HGBA1C 8.5 (H) 03/22/2018   ------------------------------------------------------------------------------------------------------------------ No results for input(s): TSH, T4TOTAL, T3FREE, THYROIDAB in the last 72 hours.  Invalid input(s): FREET3 ------------------------------------------------------------------------------------------------------------------ No results for input(s): VITAMINB12, FOLATE, FERRITIN, TIBC, IRON, RETICCTPCT in the last 72 hours.  Coagulation profile Recent Labs  Lab 03/22/18 1434 03/23/18 1125  INR 1.63 1.60    No results for input(s): DDIMER in the last 72 hours.  Cardiac Enzymes Recent Labs  Lab 03/22/18 1434 03/22/18 1930 03/22/18 2232  TROPONINI <0.03 <0.03 <0.03   ------------------------------------------------------------------------------------------------------------------    Component Value Date/Time   BNP 1,049.0 (H) 03/22/2018 1434    Inpatient Medications  Scheduled Meds: . [START ON 03/24/2018] aspirin  81 mg Oral Pre-Cath  . [START ON 03/24/2018] aspirin EC  81 mg Oral Daily  . clopidogrel  75 mg Oral Daily  . gabapentin  300-600 mg Oral BID  . insulin aspart  0-9 Units Subcutaneous TID WC  . insulin glargine  12 Units Subcutaneous Q2200  . [START ON 03/24/2018] insulin glargine  25 Units Subcutaneous QHS  . lisinopril  20 mg Oral Daily  . Living Better with Heart Failure Book   Does not apply Once  . mouth rinse  15 mL Mouth Rinse BID  . metoprolol tartrate  50 mg Oral BID  . pantoprazole  40 mg Oral Daily  . rosuvastatin  5 mg Oral Daily  . sodium chloride flush  3 mL Intravenous Q12H   Continuous Infusions: . sodium  chloride    . [START ON 03/24/2018] sodium chloride    . heparin 1,100 Units/hr (03/23/18 0815)   PRN Meds:.sodium chloride, acetaminophen, albuterol, nitroGLYCERIN, ondansetron (ZOFRAN) IV, sodium  chloride flush  Micro Results Recent Results (from the past 240 hour(s))  MRSA PCR Screening     Status: None   Collection Time: 03/22/18  6:54 PM  Result Value Ref Range Status   MRSA by PCR NEGATIVE NEGATIVE Final    Comment:        The GeneXpert MRSA Assay (FDA approved for NASAL specimens only), is one component of a comprehensive MRSA colonization surveillance program. It is not intended to diagnose MRSA infection nor to guide or monitor treatment for MRSA infections. Performed at [facility], [address], [place], [place] [address]     Radiology Reports Dg Chest 2 View  Result Date: 03/22/2018 CLINICAL DATA:  Initial evaluation for acute chest pain. EXAM: CHEST - 2 VIEW COMPARISON:  Prior radiograph from 11/05/2017 FINDINGS: Median sternotomy wires underlying CABG markers. Moderate cardiomegaly. Coronary stent noted. Mediastinal silhouette within normal limits. Tracheostomy tube in place overlying the upper airway, tip well positioned 1.9 cm above the carina. Lungs hypoinflated. Suture material noted at the peripheral right lung base. Elevation of the lateral hemidiaphragms bilaterally, similar to previous, which could be chronic and/or reflect small sub pulmonic effusions. Small amount of fluid trapped along the right minor fissure. Diffuse vascular congestion with interstitial prominence, suggesting mild pulmonary interstitial edema. Few slightly more nodular densities overlie the peripheral right upper lobe, which may reflect edema and/or atelectasis. Possible superimposed infection could be considered in the correct clinical setting. No acute osseous abnormality. IMPRESSION: 1. Cardiomegaly with mild diffuse pulmonary interstitial edema, suggesting CHF. Possible small pleural effusions. 2. Few scattered nodular densities overlying the peripheral right upper lobe, indeterminate. While these findings may reflect edema and/or atelectatic changes, possible superimposed infection  could be considered in the correct clinical setting. Radiographic follow-up to resolution recommended to ensure no underlying pulmonary nodule present, in which case cross-sectional imaging would be recommended for further evaluation. Electronically Signed   By: Jeannine Boga M.D.   On: 03/22/2018 15:53    Time Spent in minutes  35   Maebelle Sulton M.D on 03/23/2018 at 12:48 PM  Between 7am to 7pm - Pager - [phone]  After 7pm go to [email or web address] - password [facility]  [facility] -  Office  [phone]

## 2018-03-23 NOTE — Progress Notes (Signed)
ANTICOAGULATION CONSULT NOTE -   Pharmacy Consult for Heparin Indication: chest pain/ACS  Allergies  Allergen Reactions  . Penicillins Rash    Has patient had a PCN reaction causing immediate rash, facial/tongue/throat swelling, SOB or lightheadedness with hypotension: Yes Has patient had a PCN reaction causing severe rash involving mucus membranes or skin necrosis: No Has patient had a PCN reaction that required hospitalization No Has patient had a PCN reaction occurring within the last 10 years: No If all of the above answers are "NO", then may proceed with Cephalosporin use.   REACTION: rash  . Ciprofloxacin     nausea  . Ibuprofen Rash    swelling in leg     Patient Measurements: Height: 4\' 11"  (149.9 cm) Weight: 200 lb (90.7 kg) IBW/kg (Calculated) : 43.2 HEPARIN DW (KG): 65  Vital Signs: Temp: 97.7 F (36.5 C) (07/18 0542) Temp Source: Oral (07/18 0542) BP: 117/68 (07/18 0542) Pulse Rate: 62 (07/18 0542)  Labs: Recent Labs    03/22/18 1434 03/22/18 1930 03/22/18 2232 03/23/18 0607  HGB 14.0  --   --  13.7  HCT 41.8  --   --  41.8  PLT 159  --   --  164  LABPROT 19.2*  --   --   --   INR 1.63  --   --   --   HEPARINUNFRC  --   --  0.16* 0.20*  CREATININE 0.68  --   --   --   TROPONINI <0.03 <0.03 <0.03  --     Estimated Creatinine Clearance: 80.8 mL/min (by C-G formula based on SCr of 0.68 mg/dL).   Medical History: Past Medical History:  Diagnosis Date  . CAD (coronary artery disease)    a. s/p prior PCI. b. CABG 2007 at [facility] in [place] 2007. c. inferior STEMI 10/2015 s/p DES to dSVG-PDA.  Marland Kitchen Cervical cancer ([place])   . Chronic diastolic CHF (congestive heart failure) ([place])   . Chronic respiratory failure ([place])    s/p tracheostomy 2002  . Chronic RUQ pain   . COPD (chronic obstructive pulmonary disease) ([place])   . Diabetes mellitus ([place])   . DVT (deep venous thrombosis) ([place])   . Endometriosis   . History of gallstones 01/2016   seen on Ultrasound  . History of HIDA scan 11/2016   normal  . HTN (hypertension)   . Hyperlipidemia   . Lupus anticoagulant disorder (HCC)    on coumadin  . Morbid obesity ([place])   . ST elevation (STEMI) myocardial infarction involving right coronary artery ([place]) 10/29/15   stent to VG to PDA  . Tracheostomy in place [facility]), chronic since 2002 11/03/2015    Medications:  See med rec  Assessment: 52 yo female presented to heart care office today and has had daily exertional chest burning since her chest pain a few weeks ago. Patient on chronic coumadin for previous DVT but INR is subtherapeutic at 1.63. Pharmacy asked to start heparin.  Okay for protocol  Goal of Therapy:  INR 2-3 Monitor platelets by anticoagulation protocol: Yes   Plan:  Increase heparin infusion to 1100 units/hr Check anti-Xa level in 6 hours and daily while on heparin Continue to monitor H&H and platelets.   Margot Ables, PharmD Clinical Pharmacist 03/23/2018 8:08 AM

## 2018-03-23 NOTE — Progress Notes (Signed)
Discussed Lasix with Dr. Harl Bowie - since patient's cath is not until tomorrow afternoon, will recheck Cr - if stable, recommend to give another 40mg  IV Lasix - I will sign out to on-call [facility] APP. I have put in care order for the BMET to be called to them for review. Dayna Dunn PA-C

## 2018-03-23 NOTE — Progress Notes (Signed)
Contacted by RN around [phone] for patient desaturating. We discussed options. At this time I feel it necessary to place ATC on patient. Pt wears [place] at home. I suctioned patient and got very little secretions. There was however some resistance when I initially passed the catheter. She stated that she cleans her inner cannula multiple times a day with trach care kit provided by [place]. Her SPO2 increased to 97% after suction and placing on 40% ATC. She is not having any difficulty breathing. RT will  Continue to monitor.

## 2018-03-24 ENCOUNTER — Inpatient Hospital Stay (HOSPITAL_COMMUNITY): Payer: Medicare Other

## 2018-03-24 ENCOUNTER — Encounter (HOSPITAL_COMMUNITY)
Admission: EM | Disposition: A | Discharge status: Home / Self Care | Payer: Self-pay | Source: Home / Self Care | Attending: Cardiology

## 2018-03-24 DIAGNOSIS — S75009A Unspecified injury of femoral artery, unspecified leg, initial encounter: Secondary | ICD-10-CM

## 2018-03-24 DIAGNOSIS — I272 Pulmonary hypertension, unspecified: Secondary | ICD-10-CM

## 2018-03-24 DIAGNOSIS — I10 Essential (primary) hypertension: Secondary | ICD-10-CM

## 2018-03-24 DIAGNOSIS — Z930 Tracheostomy status: Secondary | ICD-10-CM

## 2018-03-24 DIAGNOSIS — Z951 Presence of aortocoronary bypass graft: Secondary | ICD-10-CM

## 2018-03-24 DIAGNOSIS — I5033 Acute on chronic diastolic (congestive) heart failure: Secondary | ICD-10-CM

## 2018-03-24 DIAGNOSIS — E785 Hyperlipidemia, unspecified: Secondary | ICD-10-CM

## 2018-03-24 HISTORY — PX: RIGHT/LEFT HEART CATH AND CORONARY/GRAFT ANGIOGRAPHY: CATH118267

## 2018-03-24 LAB — POCT I-STAT 3, VENOUS BLOOD GAS (G3P V)
Acid-Base Excess: 5 mmol/L — ABNORMAL HIGH (ref 0.0–2.0)
Bicarbonate: 31.5 mmol/L — ABNORMAL HIGH (ref 20.0–28.0)
O2 SAT: 53 %
PO2 VEN: 30 mmHg — AB (ref 32.0–45.0)
TCO2: 33 mmol/L — ABNORMAL HIGH (ref 22–32)
pCO2, Ven: 53.9 mmHg (ref 44.0–60.0)
pH, Ven: 7.375 (ref 7.250–7.430)

## 2018-03-24 LAB — CBC
HEMATOCRIT: 42.6 % (ref 36.0–46.0)
HEMOGLOBIN: 13.6 g/dL (ref 12.0–15.0)
MCH: 30.6 pg (ref 26.0–34.0)
MCHC: 31.9 g/dL (ref 30.0–36.0)
MCV: 95.9 fL (ref 78.0–100.0)
Platelets: 173 10*3/uL (ref 150–400)
RBC: 4.44 MIL/uL (ref 3.87–5.11)
RDW: 16.5 % — AB (ref 11.5–15.5)
WBC: 5.7 10*3/uL (ref 4.0–10.5)

## 2018-03-24 LAB — POCT I-STAT 3, ART BLOOD GAS (G3+)
Acid-Base Excess: 4 mmol/L — ABNORMAL HIGH (ref 0.0–2.0)
BICARBONATE: 28.5 mmol/L — AB (ref 20.0–28.0)
O2 Saturation: 92 %
PCO2 ART: 42.9 mmHg (ref 32.0–48.0)
PH ART: 7.43 (ref 7.350–7.450)
TCO2: 30 mmol/L (ref 22–32)
pO2, Arterial: 62 mmHg — ABNORMAL LOW (ref 83.0–108.0)

## 2018-03-24 LAB — LIPID PANEL
CHOL/HDL RATIO: 3.8 ratio
CHOLESTEROL: 167 mg/dL (ref 0–200)
HDL: 44 mg/dL (ref >40)
LDL Cholesterol: 103 mg/dL — ABNORMAL HIGH (ref 0–99)
Triglycerides: 101 mg/dL (ref <150)
VLDL: 20 mg/dL (ref 0–40)

## 2018-03-24 LAB — GLUCOSE, CAPILLARY
GLUCOSE-CAPILLARY: 94 mg/dL (ref 70–99)
GLUCOSE-CAPILLARY: 69 mg/dL — AB (ref 70–99)
Glucose-Capillary: 102 mg/dL — ABNORMAL HIGH (ref 70–99)
Glucose-Capillary: 85 mg/dL (ref 70–99)
Glucose-Capillary: 213 mg/dL — ABNORMAL HIGH (ref 70–99)

## 2018-03-24 LAB — BASIC METABOLIC PANEL
ANION GAP: 8 (ref 5–15)
BUN: 10 mg/dL (ref 6–20)
CALCIUM: 8.6 mg/dL — AB (ref 8.9–10.3)
CHLORIDE: 100 mmol/L (ref 98–111)
CO2: 31 mmol/L (ref 22–32)
Creatinine, Ser: 0.97 mg/dL (ref 0.44–1.00)
GFR calc non Af Amer: >60 mL/min (ref >60)
Glucose, Bld: 148 mg/dL — ABNORMAL HIGH (ref 70–99)
POTASSIUM: 4.4 mmol/L (ref 3.5–5.1)
Sodium: 139 mmol/L (ref 135–145)

## 2018-03-24 LAB — PROTIME-INR
INR: 1.44
Prothrombin Time: 17.4 seconds — ABNORMAL HIGH (ref 11.4–15.2)

## 2018-03-24 LAB — HEPARIN LEVEL (UNFRACTIONATED): HEPARIN UNFRACTIONATED: 0.4 [IU]/mL (ref 0.30–0.70)

## 2018-03-24 LAB — POCT ACTIVATED CLOTTING TIME: Activated Clotting Time: 153 seconds

## 2018-03-24 SURGERY — RIGHT/LEFT HEART CATH AND CORONARY/GRAFT ANGIOGRAPHY
Anesthesia: LOCAL

## 2018-03-24 MED ORDER — MIDAZOLAM HCL 2 MG/2ML IJ SOLN
INTRAMUSCULAR | Status: AC
  Filled 2018-03-24: qty 2

## 2018-03-24 MED ORDER — FENTANYL CITRATE (PF) 100 MCG/2ML IJ SOLN
INTRAMUSCULAR | Status: AC
  Filled 2018-03-24: qty 2

## 2018-03-24 MED ORDER — IOPAMIDOL (ISOVUE-370) INJECTION 76%
INTRAVENOUS | Status: AC
  Filled 2018-03-24: qty 125

## 2018-03-24 MED ORDER — SODIUM CHLORIDE 0.9% FLUSH: 3.0000 mL | INTRAVENOUS | Status: DC | PRN

## 2018-03-24 MED ORDER — HEPARIN (PORCINE) IN NACL 100-0.45 UNIT/ML-% IJ SOLN
1100.0000 [IU]/h | INTRAMUSCULAR | Status: DC
  Administered 2018-03-24: 900 [IU]/h via INTRAVENOUS
  Administered 2018-03-26 – 2018-03-28 (×4): 1100 [IU]/h via INTRAVENOUS
  Filled 2018-03-24 (×5): qty 250

## 2018-03-24 MED ORDER — LIDOCAINE HCL (PF) 1 % IJ SOLN
INTRAMUSCULAR | Status: AC
  Filled 2018-03-24: qty 30

## 2018-03-24 MED ORDER — WARFARIN - PHARMACIST DOSING INPATIENT
Freq: Every day | Status: DC
  Administered 2018-03-24 – 2018-03-28 (×2)

## 2018-03-24 MED ORDER — SODIUM CHLORIDE 0.9 % IV SOLN
INTRAVENOUS | Status: AC
  Administered 2018-03-24: 16:00:00 via INTRAVENOUS

## 2018-03-24 MED ORDER — MIDAZOLAM HCL 2 MG/2ML IJ SOLN
INTRAMUSCULAR | Status: DC | PRN
  Administered 2018-03-24 (×2): 0.5 mg via INTRAVENOUS

## 2018-03-24 MED ORDER — WARFARIN SODIUM 5 MG PO TABS
5.0000 mg | ORAL_TABLET | Freq: Once | ORAL | Status: AC
  Administered 2018-03-24: 5 mg via ORAL
  Filled 2018-03-24: qty 1

## 2018-03-24 MED ORDER — ROSUVASTATIN CALCIUM 10 MG PO TABS
10.0000 mg | ORAL_TABLET | Freq: Every day | ORAL | Status: DC
  Administered 2018-03-24 – 2018-03-29 (×6): 10 mg via ORAL
  Filled 2018-03-24 (×5): qty 1

## 2018-03-24 MED ORDER — LIDOCAINE HCL (PF) 1 % IJ SOLN
INTRAMUSCULAR | Status: DC | PRN
  Administered 2018-03-24: 18 mL

## 2018-03-24 MED ORDER — ACETAMINOPHEN 325 MG PO TABS
650.0000 mg | ORAL_TABLET | ORAL | Status: DC | PRN
  Administered 2018-03-25: 650 mg via ORAL
  Filled 2018-03-24 (×5): qty 2

## 2018-03-24 MED ORDER — HEPARIN (PORCINE) IN NACL 1000-0.9 UT/500ML-% IV SOLN
INTRAVENOUS | Status: AC
  Filled 2018-03-24: qty 1000

## 2018-03-24 MED ORDER — SODIUM CHLORIDE 0.9 % IV SOLN
250.0000 mL | INTRAVENOUS | Status: DC | PRN
  Administered 2018-03-24 – 2018-03-25 (×2): 250 mL via INTRAVENOUS

## 2018-03-24 MED ORDER — ONDANSETRON HCL 4 MG/2ML IJ SOLN
4.0000 mg | Freq: Four times a day (QID) | INTRAMUSCULAR | Status: DC | PRN
  Administered 2018-03-27: 4 mg via INTRAVENOUS
  Filled 2018-03-24: qty 2

## 2018-03-24 MED ORDER — SODIUM CHLORIDE 0.9% FLUSH
3.0000 mL | Freq: Two times a day (BID) | INTRAVENOUS | Status: DC
  Administered 2018-03-25 – 2018-03-28 (×3): 3 mL via INTRAVENOUS

## 2018-03-24 MED ORDER — OXYCODONE HCL 5 MG PO TABS
5.0000 mg | ORAL_TABLET | ORAL | Status: DC | PRN
  Administered 2018-03-27 – 2018-03-28 (×2): 10 mg via ORAL
  Filled 2018-03-24 (×2): qty 2

## 2018-03-24 MED ORDER — CLOPIDOGREL BISULFATE 75 MG PO TABS: 75.0000 mg | ORAL_TABLET | Freq: Every day | ORAL | Status: DC

## 2018-03-24 MED ORDER — IOPAMIDOL (ISOVUE-370) INJECTION 76%
INTRAVENOUS | Status: DC | PRN
  Administered 2018-03-24: 80 mL via INTRA_ARTERIAL

## 2018-03-24 MED ORDER — FENTANYL CITRATE (PF) 100 MCG/2ML IJ SOLN
INTRAMUSCULAR | Status: DC | PRN
  Administered 2018-03-24 (×2): 25 ug via INTRAVENOUS

## 2018-03-24 MED ORDER — HEPARIN (PORCINE) IN NACL 1000-0.9 UT/500ML-% IV SOLN
INTRAVENOUS | Status: DC | PRN
  Administered 2018-03-24 (×2): 500 mL

## 2018-03-24 SURGICAL SUPPLY — 11 items
CATH INFINITI 5 FR IM (CATHETERS) ×1 IMPLANT
CATH INFINITI 5FR MPB2 (CATHETERS) ×1 IMPLANT
CATH SWAN GANZ 7F STRAIGHT (CATHETERS) ×1 IMPLANT
COVER PRB 48X5XTLSCP FOLD TPE (BAG) IMPLANT
COVER PROBE 5X48 (BAG) ×2
KIT HEART LEFT (KITS) ×2 IMPLANT
PACK CARDIAC CATHETERIZATION (CUSTOM PROCEDURE TRAY) ×2 IMPLANT
SHEATH PINNACLE 5F 10CM (SHEATH) ×1 IMPLANT
SHEATH PINNACLE 7F 10CM (SHEATH) ×1 IMPLANT
TRANSDUCER W/STOPCOCK (MISCELLANEOUS) ×2 IMPLANT
WIRE EMERALD 3MM-J .035X150CM (WIRE) ×2 IMPLANT

## 2018-03-24 NOTE — Progress Notes (Signed)
Inpatient Diabetes Program Recommendations  AACE/ADA: New Consensus Statement on Inpatient Glycemic Control (2015)  Target Ranges:  Prepandial:   less than 140 mg/dL      Peak postprandial:   less than 180 mg/dL (1-2 hours)      Critically ill patients:  140 - 180 mg/dL   Lab Results  Component Value Date   GLUCAP 94 03/24/2018   HGBA1C 8.5 (H) 03/22/2018    Review of Glycemic Control Results for Theresa, [name] (MRN [id number]) as of 03/24/2018 12:11  Ref. Range 03/23/2018 17:14 03/23/2018 21:20 03/24/2018 08:31 03/24/2018 11:37  Glucose-Capillary Latest Ref Range: 70 - 99 mg/dL 200 (H) 146 (H) 102 (H) 94   Diabetes history: Type 2 DM Outpatient Diabetes medications: Tresiba 25 units QHS Current orders for Inpatient glycemic control: Novolog 0-9 units TID, Lantus 25 units QHS  Inpatient Diabetes Program Recommendations:    Of note patient received 12 units of Lantus on 03/23/18 and subsequent FSBS was 102 mg/dL. Order to increase Lantus to home dose of Lantus 25 units QHS. Would hesitate from increase of Lantus given current BS. At risk for hypoglycemia.  AT this time would recommend decreasing Lantus back to 12 units QHS. Then can titrate as necessary.   Thanks, Bronson Curb, MSN, RNC-OB Diabetes Coordinator [phone] (8a-5p)

## 2018-03-24 NOTE — Progress Notes (Signed)
[place] for Heparin Indication: chest pain/ACS, hx DVT, lupus anticoagulant  Allergies  Allergen Reactions  . Penicillins Rash    Has patient had a PCN reaction causing immediate rash, facial/tongue/throat swelling, SOB or lightheadedness with hypotension: Yes Has patient had a PCN reaction causing severe rash involving mucus membranes or skin necrosis: No Has patient had a PCN reaction that required hospitalization No Has patient had a PCN reaction occurring within the last 10 years: No If all of the above answers are "NO", then may proceed with Cephalosporin use.   REACTION: rash  . Ciprofloxacin     nausea  . Ibuprofen Rash    swelling in leg     Patient Measurements: Height: 4\' 11"  (149.9 cm) Weight: 189 lb 3.2 oz (85.8 kg) IBW/kg (Calculated) : 43.2 HEPARIN DW (KG): 65  Vital Signs: Temp: 98 F (36.7 C) (07/19 0500) Temp Source: Oral (07/19 0500) BP: 103/65 (07/19 0915) Pulse Rate: 64 (07/19 0915)  Labs: Recent Labs    03/22/18 1434 03/22/18 1930 03/22/18 2232 03/23/18 0607 03/23/18 1125 03/23/18 2020 03/24/18 0451  HGB 14.0  --   --  13.7  --   --  13.6  HCT 41.8  --   --  41.8  --   --  42.6  PLT 159  --   --  164  --   --  173  LABPROT 19.2*  --   --   --  18.9*  --  17.4*  INR 1.63  --   --   --  1.60  --  1.44  HEPARINUNFRC  --   --  0.16* 0.20*  --   --  0.40  CREATININE 0.68  --   --   --   --  0.96 0.97  TROPONINI <0.03 <0.03 <0.03  --   --   --   --     Estimated Creatinine Clearance: 64.5 mL/min (by C-G formula based on SCr of 0.97 mg/dL).   Medical History: Past Medical History:  Diagnosis Date  . CAD (coronary artery disease)    a. s/p prior PCI. b. CABG 2007 at [facility] in [place] 2007. c. inferior STEMI 10/2015 s/p DES to dSVG-PDA.  Marland Kitchen Cervical cancer ([place])   . Chronic diastolic CHF (congestive heart failure) ([place])   . Chronic respiratory failure ([place])    s/p tracheostomy 2002  .  Chronic RUQ pain   . COPD (chronic obstructive pulmonary disease) ([place])   . Diabetes mellitus ([place])   . DVT (deep venous thrombosis) ([place])   . Endometriosis   . History of gallstones 01/2016   seen on Ultrasound  . History of HIDA scan 11/2016   normal  . HTN (hypertension)   . Hyperlipidemia   . Lupus anticoagulant disorder (HCC)    on coumadin  . Morbid obesity ([place])   . ST elevation (STEMI) myocardial infarction involving right coronary artery ([place]) 10/29/15   stent to VG to PDA  . Tracheostomy in place [facility]), chronic since 2002 11/03/2015    Assessment: 52 yo female on chronic coumadin for previous DVT, lupus anticoagulant presenting with CP. Cardiology planning cath. INR is subtherapeutic at 1.44. Pharmacy consulted to dose heparin. CBC wnl. No bleed documented.  Goal of Therapy:  Heparin level 0.3-0.7 units/ml Monitor platelets by anticoagulation protocol: Yes   Plan:  Continue heparin at 1100 units/hr Daily heparin level/CBC Monitor s/sx bleeding Cath planned 7/19 - f/u resume warfarin post-op  Elicia Lamp,  PharmD, BCPS Clinical Pharmacist Clinical phone [phone] Please check AMION for all [place] contact numbers 03/24/2018 9:32 AM

## 2018-03-24 NOTE — Interval H&P Note (Signed)
Cath Lab Visit (complete for each Cath Lab visit)  Clinical Evaluation Leading to the Procedure:   ACS: Yes.    Non-ACS:    Anginal Classification: CCS III  Anti-ischemic medical therapy: Maximal Therapy (2 or more classes of medications)  Non-Invasive Test Results: No non-invasive testing performed  Prior CABG: Previous CABG      History and Physical Interval Note:  03/24/2018 1:34 PM  Theresa [name]  has presented today for surgery, with the diagnosis of unstable angina, acute diastolic heart failure  The various methods of treatment have been discussed with the patient and family. After consideration of risks, benefits and other options for treatment, the patient has consented to  Procedure(s): RIGHT/LEFT HEART CATH AND CORONARY/GRAFT ANGIOGRAPHY (N/A) as a surgical intervention .  The patient's history has been reviewed, patient examined, no change in status, stable for surgery.  I have reviewed the patient's chart and labs.  Questions were answered to the patient's satisfaction.     Belva Crome III

## 2018-03-24 NOTE — H&P (View-Only) (Signed)
Progress Note  Patient Name: Theresa [name] Date of Encounter: 03/24/2018  Primary Cardiologist: [name], MD   Subjective   No chest pain or shortness of breath overnight.  The patient is a little anxious about her cath.  She says that when she had prior cath with radial access she had pain and she would like to have femoral access.  Inpatient Medications    Scheduled Meds: . aspirin EC  81 mg Oral Daily  . clopidogrel  75 mg Oral Daily  . gabapentin  300-600 mg Oral BID  . insulin aspart  0-9 Units Subcutaneous TID WC  . insulin glargine  25 Units Subcutaneous QHS  . lisinopril  20 mg Oral Daily  . Living Better with Heart Failure Book   Does not apply Once  . mouth rinse  15 mL Mouth Rinse BID  . metoprolol tartrate  50 mg Oral BID  . pantoprazole  40 mg Oral Daily  . rosuvastatin  5 mg Oral Daily  . sodium chloride flush  3 mL Intravenous Q12H   Continuous Infusions: . sodium chloride    . sodium chloride 10 mL/hr at 03/24/18 0520  . heparin 1,100 Units/hr (03/23/18 1350)   PRN Meds: sodium chloride, acetaminophen, albuterol, nitroGLYCERIN, ondansetron (ZOFRAN) IV, sodium chloride flush   Vital Signs    Vitals:   03/23/18 2034 03/23/18 2300 03/24/18 0500 03/24/18 0651  BP:      Pulse:   90   Resp:   18   Temp:   98 F (36.7 C)   TempSrc:   Oral   SpO2: 94% 94% 92% 90%  Weight:   189 lb 3.2 oz (85.8 kg)   Height:        Intake/Output Summary (Last 24 hours) at 03/24/2018 0849 Last data filed at 03/24/2018 0500 Gross per 24 hour  Intake 604.67 ml  Output 2200 ml  Net -1595.33 ml   Filed Weights   03/22/18 1413 03/24/18 0500  Weight: 200 lb (90.7 kg) 189 lb 3.2 oz (85.8 kg)    Telemetry    Sinus rhythm in the upper 50s to low 60s- Personally Reviewed  ECG    New tracings- Personally Reviewed  Physical Exam   GEN:  Obese female, no acute distress.   Neck: No JVD.  Left carotid bruit noted Cardiac: RRR, no murmurs, rubs, or gallops.    Respiratory: Clear to auscultation bilaterally.  Trach present and patient using trach collar GI: Soft, nontender, non-distended  MS:  Trace lower extremity edema;  Neuro:  Nonfocal  Psych: Normal affect   Labs    Chemistry Recent Labs  Lab 03/22/18 1434 03/23/18 2020 03/24/18 0451  NA 138 138 139  K 3.7 3.0* 4.4  CL 102 100 100  CO2 28 28 31   GLUCOSE 105* 168* 148*  BUN 10 10 10   CREATININE 0.68 0.96 0.97  CALCIUM 8.6* 8.4* 8.6*  GFRNONAA >60 >60 >60  GFRAA >60 >60 >60  ANIONGAP 8 10 8      Hematology Recent Labs  Lab 03/22/18 1434 03/23/18 0607 03/24/18 0451  WBC 5.1 5.8 5.7  RBC 4.38 4.36 4.44  HGB 14.0 13.7 13.6  HCT 41.8 41.8 42.6  MCV 95.4 95.9 95.9  MCH 32.0 31.4 30.6  MCHC 33.5 32.8 31.9  RDW 16.7* 16.5* 16.5*  PLT 159 164 173    Cardiac Enzymes Recent Labs  Lab 03/22/18 1434 03/22/18 1930 03/22/18 2232  TROPONINI <0.03 <0.03 <0.03    Recent Labs  Lab 03/22/18 1434  TROPIPOC 0.01     BNP Recent Labs  Lab 03/22/18 1434  BNP 1,049.0*     DDimer No results for input(s): DDIMER in the last 168 hours.   Radiology    Dg Chest 2 View  Result Date: 03/22/2018 CLINICAL DATA:  Initial evaluation for acute chest pain. EXAM: CHEST - 2 VIEW COMPARISON:  Prior radiograph from 11/05/2017 FINDINGS: Median sternotomy wires underlying CABG markers. Moderate cardiomegaly. Coronary stent noted. Mediastinal silhouette within normal limits. Tracheostomy tube in place overlying the upper airway, tip well positioned 1.9 cm above the carina. Lungs hypoinflated. Suture material noted at the peripheral right lung base. Elevation of the lateral hemidiaphragms bilaterally, similar to previous, which could be chronic and/or reflect small sub pulmonic effusions. Small amount of fluid trapped along the right minor fissure. Diffuse vascular congestion with interstitial prominence, suggesting mild pulmonary interstitial edema. Few slightly more nodular densities overlie  the peripheral right upper lobe, which may reflect edema and/or atelectasis. Possible superimposed infection could be considered in the correct clinical setting. No acute osseous abnormality. IMPRESSION: 1. Cardiomegaly with mild diffuse pulmonary interstitial edema, suggesting CHF. Possible small pleural effusions. 2. Few scattered nodular densities overlying the peripheral right upper lobe, indeterminate. While these findings may reflect edema and/or atelectatic changes, possible superimposed infection could be considered in the correct clinical setting. Radiographic follow-up to resolution recommended to ensure no underlying pulmonary nodule present, in which case cross-sectional imaging would be recommended for further evaluation. Electronically Signed   By: Jeannine Boga M.D.   On: 03/22/2018 15:53    Cardiac Studies   Echocardiogram 08/02/2017 Study Conclusions - Left ventricle: The cavity size was normal. Wall thickness was   increased in a pattern of mild LVH. Systolic function was normal.   The estimated ejection fraction was 50%. There is akinesis of the   midanteroseptal myocardium. There is hypokinesis of the   basal-midinferior myocardium. Doppler parameters are consistent   with restrictive physiology, indicative of decreased left   ventricular diastolic compliance and/or increased left atrial   pressure. - Aortic valve: Mildly calcified annulus. Trileaflet. - Mitral valve: Mildly calcified annulus. There was mild   regurgitation. - Left atrium: The atrium was moderately dilated. - Atrial septum: No defect or patent foramen ovale was identified. - Tricuspid valve: There was mild regurgitation. - Pulmonary arteries: PA peak pressure: 47 mm Hg (S). - Pericardium, extracardiac: There was no pericardial effusion.  Impressions: - Images are limited. There is mild LVH with LVEF approximately   50%. Akinesis of the mid anteroseptal wall noted as well as   hypokinesis of the  mid to basal inferior wall. Restrictive   diastolic filling pattern. Moderate left atrial enlargement.   Mildly calcified mitral annulus with mild mitral regurgitation.   Mild tricuspid regurgitation with elevated PASP of 47 mmHg.   Patient Profile     52 y.o. female with history of CAD (s/p prior PCI, CABG 2007 at [facility] in [place] 2007, inferior STEMI 10/2015 s/p DES to dSVG-PDA), chronic diastolic CHF, morbid obesity, COPD, DM, HTN, HLD, lupus anticoagulant, DVT on chronic anticoagulation, cervical cancer, endometriosis, chronic respiratory failure with trachestomy 2002 presented with hx of severe chest pain 2 weeks and then exertional chest burning and dyspnea since that time. Transferred from [facility] to [facility] for cath. Also diuresed with IV lasix.   Assessment & Plan    Unstable angina -Patient presented with complaints of symptoms concerning for an acute MI a couple  of weeks ago followed by exertional chest burning and shortness of breath since that time. -Troponins have remained negative -Mild to T wave inversions inferior and laterally -She is scheduled for cardiac catheterization today.  Her serum creatinine is stable at 0.97 and potassium has improved from 3.0-4.4.  Her INR is 1.44 today which should be acceptable for cardiac cath upon review of the performing cardiologist -The patient denies any chest pain through the night.  She thinks that she will be able to lay flat without breathing problems.  She does need her head supported for trach alignment. -She is concerned about access for the cardiac cath as she had pain with the prior cath using radial access and she prefers femoral access.  I instructed her to discuss this with the performing provider.  Acute on chronic diastolic CHF -Consistent use of diuretic at home, likely leading to treatment failure -She has been given several doses of IV Lasix and reports that breathing is better and her edema is nearly  resolved. -She has had 2.2 L UOP in last 24 h. Weight is down 11 pounds per documentation. -Labs are stable. -Lungs are clear and lower extremity edema is greatly improved per patient report.  The patient feels that she can lay flat for her procedure.  Hypertension -Home medications include lisinopril 20 mg, metoprolol 50 mg twice daily, Lasix 40 mg twice daily(which she was not consistent with) -BP is currently well controlled and stable on the IV diuresis  Hyperlipidemia -Currently LDL is 103, on rosuvastatin 5 mg daily.  Will increase her dose to 10 mg  History of DVT on chronic anticoagulation -Coumadin is on hold for cardiac cath.  Currently on heparin drip  For questions or updates, please contact [place] Please consult [email or web address] for contact info under Cardiology/STEMI.      Signed, Daune Perch, NP  03/24/2018, 8:49 AM    Patient examined chart reviewed. Exam with tracheostomy mild exp wheezing no murmur. Post sternotomy She prefers to be cathed from leg as previous cath 2017 painful from arm Discussed increased risk of bleeding Known CAD with patient stent circumflex 2017 patent LIMA to LAD and had intervention to distal anastamosis of RCA graft. Resume heparin and coumadin post cath  [facility]

## 2018-03-24 NOTE — Progress Notes (Addendum)
Progress Note  Patient Name: Theresa [name] Date of Encounter: 03/24/2018  Primary Cardiologist: Theresa Breeding, MD   Subjective   No chest pain or shortness of breath overnight.  The patient is a little anxious about her cath.  She says that when she had prior cath with radial access she had pain and she would like to have femoral access.  Inpatient Medications    Scheduled Meds: . aspirin EC  81 mg Oral Daily  . clopidogrel  75 mg Oral Daily  . gabapentin  300-600 mg Oral BID  . insulin aspart  0-9 Units Subcutaneous TID WC  . insulin glargine  25 Units Subcutaneous QHS  . lisinopril  20 mg Oral Daily  . Living Better with Heart Failure Book   Does not apply Once  . mouth rinse  15 mL Mouth Rinse BID  . metoprolol tartrate  50 mg Oral BID  . pantoprazole  40 mg Oral Daily  . rosuvastatin  5 mg Oral Daily  . sodium chloride flush  3 mL Intravenous Q12H   Continuous Infusions: . sodium chloride    . sodium chloride 10 mL/hr at 03/24/18 0520  . heparin 1,100 Units/hr (03/23/18 1350)   PRN Meds: sodium chloride, acetaminophen, albuterol, nitroGLYCERIN, ondansetron (ZOFRAN) IV, sodium chloride flush   Vital Signs    Vitals:   03/23/18 2034 03/23/18 2300 03/24/18 0500 03/24/18 0651  BP:      Pulse:   90   Resp:   18   Temp:   98 F (36.7 C)   TempSrc:   Oral   SpO2: 94% 94% 92% 90%  Weight:   189 lb 3.2 oz (85.8 kg)   Height:        Intake/Output Summary (Last 24 hours) at 03/24/2018 0849 Last data filed at 03/24/2018 0500 Gross per 24 hour  Intake 604.67 ml  Output 2200 ml  Net -1595.33 ml   Filed Weights   03/22/18 1413 03/24/18 0500  Weight: 200 lb (90.7 kg) 189 lb 3.2 oz (85.8 kg)    Telemetry    Sinus rhythm in the upper 50s to low 60s- Personally Reviewed  ECG    New tracings- Personally Reviewed  Physical Exam   GEN:  Obese female, no acute distress.   Neck: No JVD.  Left carotid bruit noted Cardiac: RRR, no murmurs, rubs, or gallops.    Respiratory: Clear to auscultation bilaterally.  Trach present and patient using trach collar GI: Soft, nontender, non-distended  MS:  Trace lower extremity edema;  Neuro:  Nonfocal  Psych: Normal affect   Labs    Chemistry Recent Labs  Lab 03/22/18 1434 03/23/18 2020 03/24/18 0451  NA 138 138 139  K 3.7 3.0* 4.4  CL 102 100 100  CO2 28 28 31   GLUCOSE 105* 168* 148*  BUN 10 10 10   CREATININE 0.68 0.96 0.97  CALCIUM 8.6* 8.4* 8.6*  GFRNONAA >60 >60 >60  GFRAA >60 >60 >60  ANIONGAP 8 10 8      Hematology Recent Labs  Lab 03/22/18 1434 03/23/18 0607 03/24/18 0451  WBC 5.1 5.8 5.7  RBC 4.38 4.36 4.44  HGB 14.0 13.7 13.6  HCT 41.8 41.8 42.6  MCV 95.4 95.9 95.9  MCH 32.0 31.4 30.6  MCHC 33.5 32.8 31.9  RDW 16.7* 16.5* 16.5*  PLT 159 164 173    Cardiac Enzymes Recent Labs  Lab 03/22/18 1434 03/22/18 1930 03/22/18 2232  TROPONINI <0.03 <0.03 <0.03    Recent Labs  Lab 03/22/18 1434  TROPIPOC 0.01     BNP Recent Labs  Lab 03/22/18 1434  BNP 1,049.0*     DDimer No results for input(s): DDIMER in the last 168 hours.   Radiology    Dg Chest 2 View  Result Date: 03/22/2018 CLINICAL DATA:  Initial evaluation for acute chest pain. EXAM: CHEST - 2 VIEW COMPARISON:  Prior radiograph from 11/05/2017 FINDINGS: Median sternotomy wires underlying CABG markers. Moderate cardiomegaly. Coronary stent noted. Mediastinal silhouette within normal limits. Tracheostomy tube in place overlying the upper airway, tip well positioned 1.9 cm above the carina. Lungs hypoinflated. Suture material noted at the peripheral right lung base. Elevation of the lateral hemidiaphragms bilaterally, similar to previous, which could be chronic and/or reflect small sub pulmonic effusions. Small amount of fluid trapped along the right minor fissure. Diffuse vascular congestion with interstitial prominence, suggesting mild pulmonary interstitial edema. Few slightly more nodular densities overlie  the peripheral right upper lobe, which may reflect edema and/or atelectasis. Possible superimposed infection could be considered in the correct clinical setting. No acute osseous abnormality. IMPRESSION: 1. Cardiomegaly with mild diffuse pulmonary interstitial edema, suggesting CHF. Possible small pleural effusions. 2. Few scattered nodular densities overlying the peripheral right upper lobe, indeterminate. While these findings may reflect edema and/or atelectatic changes, possible superimposed infection could be considered in the correct clinical setting. Radiographic follow-up to resolution recommended to ensure no underlying pulmonary nodule present, in which case cross-sectional imaging would be recommended for further evaluation. Electronically Signed   By: Jeannine Boga M.D.   On: 03/22/2018 15:53    Cardiac Studies   Echocardiogram 08/02/2017 Study Conclusions - Left ventricle: The cavity size was normal. Wall thickness was   increased in a pattern of mild LVH. Systolic function was normal.   The estimated ejection fraction was 50%. There is akinesis of the   midanteroseptal myocardium. There is hypokinesis of the   basal-midinferior myocardium. Doppler parameters are consistent   with restrictive physiology, indicative of decreased left   ventricular diastolic compliance and/or increased left atrial   pressure. - Aortic valve: Mildly calcified annulus. Trileaflet. - Mitral valve: Mildly calcified annulus. There was mild   regurgitation. - Left atrium: The atrium was moderately dilated. - Atrial septum: No defect or patent foramen ovale was identified. - Tricuspid valve: There was mild regurgitation. - Pulmonary arteries: PA peak pressure: 47 mm Hg (S). - Pericardium, extracardiac: There was no pericardial effusion.  Impressions: - Images are limited. There is mild LVH with LVEF approximately   50%. Akinesis of the mid anteroseptal wall noted as well as   hypokinesis of the  mid to basal inferior wall. Restrictive   diastolic filling pattern. Moderate left atrial enlargement.   Mildly calcified mitral annulus with mild mitral regurgitation.   Mild tricuspid regurgitation with elevated PASP of 47 mmHg.   Patient Profile     52 y.o. female with history of CAD (s/p prior PCI, CABG 2007 at [facility] in [place] 2007, inferior STEMI 10/2015 s/p DES to dSVG-PDA), chronic diastolic CHF, morbid obesity, COPD, DM, HTN, HLD, lupus anticoagulant, DVT on chronic anticoagulation, cervical cancer, endometriosis, chronic respiratory failure with trachestomy 2002 presented with hx of severe chest pain 2 weeks and then exertional chest burning and dyspnea since that time. Transferred from [facility] to [facility] for cath. Also diuresed with IV lasix.   Assessment & Plan    Unstable angina -Patient presented with complaints of symptoms concerning for an acute MI a couple  of weeks ago followed by exertional chest burning and shortness of breath since that time. -Troponins have remained negative -Mild to T wave inversions inferior and laterally -She is scheduled for cardiac catheterization today.  Her serum creatinine is stable at 0.97 and potassium has improved from 3.0-4.4.  Her INR is 1.44 today which should be acceptable for cardiac cath upon review of the performing cardiologist -The patient denies any chest pain through the night.  She thinks that she will be able to lay flat without breathing problems.  She does need her head supported for trach alignment. -She is concerned about access for the cardiac cath as she had pain with the prior cath using radial access and she prefers femoral access.  I instructed her to discuss this with the performing provider.  Acute on chronic diastolic CHF -Consistent use of diuretic at home, likely leading to treatment failure -She has been given several doses of IV Lasix and reports that breathing is better and her edema is nearly  resolved. -She has had 2.2 L UOP in last 24 h. Weight is down 11 pounds per documentation. -Labs are stable. -Lungs are clear and lower extremity edema is greatly improved per patient report.  The patient feels that she can lay flat for her procedure.  Hypertension -Home medications include lisinopril 20 mg, metoprolol 50 mg twice daily, Lasix 40 mg twice daily(which she was not consistent with) -BP is currently well controlled and stable on the IV diuresis  Hyperlipidemia -Currently LDL is 103, on rosuvastatin 5 mg daily.  Will increase her dose to 10 mg  History of DVT on chronic anticoagulation -Coumadin is on hold for cardiac cath.  Currently on heparin drip  For questions or updates, please contact [place] Please consult [email or web address] for contact info under Cardiology/STEMI.      Signed, Daune Perch, NP  03/24/2018, 8:49 AM    Patient examined chart reviewed. Exam with tracheostomy mild exp wheezing no murmur. Post sternotomy She prefers to be cathed from leg as previous cath 2017 painful from arm Discussed increased risk of bleeding Known CAD with patient stent circumflex 2017 patent LIMA to LAD and had intervention to distal anastamosis of RCA graft. Resume heparin and coumadin post cath  [facility]

## 2018-03-24 NOTE — Progress Notes (Signed)
[place] for Heparin and Warfarin Indication: chest pain/ACS, hx DVT, lupus anticoagulant  Allergies  Allergen Reactions  . Penicillins Rash    Has patient had a PCN reaction causing immediate rash, facial/tongue/throat swelling, SOB or lightheadedness with hypotension: Yes Has patient had a PCN reaction causing severe rash involving mucus membranes or skin necrosis: No Has patient had a PCN reaction that required hospitalization No Has patient had a PCN reaction occurring within the last 10 years: No If all of the above answers are "NO", then may proceed with Cephalosporin use.   REACTION: rash  . Ciprofloxacin     nausea  . Ibuprofen Rash    swelling in leg     Patient Measurements: Height: 4\' 11"  (149.9 cm) Weight: 189 lb 3.2 oz (85.8 kg) IBW/kg (Calculated) : 43.2 HEPARIN DW (KG): 65  Vital Signs: Temp: 98 F (36.7 C) (07/19 0500) Temp Source: Oral (07/19 0500) BP: 106/78 (07/19 1616) Pulse Rate: 65 (07/19 1616)  Labs: Recent Labs    03/22/18 1434 03/22/18 1930 03/22/18 2232 03/23/18 0607 03/23/18 1125 03/23/18 2020 03/24/18 0451  HGB 14.0  --   --  13.7  --   --  13.6  HCT 41.8  --   --  41.8  --   --  42.6  PLT 159  --   --  164  --   --  173  LABPROT 19.2*  --   --   --  18.9*  --  17.4*  INR 1.63  --   --   --  1.60  --  1.44  HEPARINUNFRC  --   --  0.16* 0.20*  --   --  0.40  CREATININE 0.68  --   --   --   --  0.96 0.97  TROPONINI <0.03 <0.03 <0.03  --   --   --   --     Estimated Creatinine Clearance: 64.5 mL/min (by C-G formula based on SCr of 0.97 mg/dL).   Medical History: Past Medical History:  Diagnosis Date  . CAD (coronary artery disease)    a. s/p prior PCI. b. CABG 2007 at [facility] in [place] 2007. c. inferior STEMI 10/2015 s/p DES to dSVG-PDA.  Marland Kitchen Cervical cancer ([place])   . Chronic diastolic CHF (congestive heart failure) ([place])   . Chronic respiratory failure ([place])    s/p tracheostomy  2002  . Chronic RUQ pain   . COPD (chronic obstructive pulmonary disease) ([place])   . Diabetes mellitus ([place])   . DVT (deep venous thrombosis) ([place])   . Endometriosis   . History of gallstones 01/2016   seen on Ultrasound  . History of HIDA scan 11/2016   normal  . HTN (hypertension)   . Hyperlipidemia   . Lupus anticoagulant disorder (H[facility])    on coumadin  . Morbid obesity ([place])   . ST elevation (STEMI) myocardial infarction involving right coronary artery ([place]) 10/29/15   stent to VG to PDA  . Tracheostomy in place [facility]), chronic since 2002 11/03/2015    Assessment: 52 yo female on chronic coumadin for previous DVT, lupus anticoagulant presenting with CP now s/p cardiac cath and Cardiology has ordered to resume Warfarin this PM and start IV Heparin 8 hours post sheath pull per Pharmacy dosing consult.   INR this AM sub-therapeutic at 1.44.  H/H wnl, Platelets up at 173. No bleeding noted.  Home Warfarin regimen is 5mg  on Mondays and Fridays and 2.5mg  all  other days.   Femoral sheath was pulled at 1520 PM.   Goal of Therapy:  INR 2-3 Heparin level 0.3-0.7 units/ml Monitor platelets by anticoagulation protocol: Yes   Plan:  Warfarin 5mg  po x1 per home dose tonight at 1800. Daily PT/INR. Restart IV Heparin at 2330 PM at rate of 900 units/hr.  Check Heparin level in 6 hours.  Daily Heparin level and CBC while on therapy.    Sloan Leiter, PharmD, BCPS, BCCCP Clinical Pharmacist Clinical phone 03/24/2018 until [phone] Please check AMION for all [place] contact numbers 03/24/2018 4:46 PM

## 2018-03-24 NOTE — Progress Notes (Signed)
CBG before dinner was 69.  Pt stated she felt a little shaky.  Gave juice and a sandwich. Recheck in 15 minutes was 85.  Idolina Primer, RN

## 2018-03-25 ENCOUNTER — Inpatient Hospital Stay (HOSPITAL_COMMUNITY): Payer: Medicare Other

## 2018-03-25 DIAGNOSIS — I340 Nonrheumatic mitral (valve) insufficiency: Secondary | ICD-10-CM

## 2018-03-25 DIAGNOSIS — I25700 Atherosclerosis of coronary artery bypass graft(s), unspecified, with unstable angina pectoris: Secondary | ICD-10-CM

## 2018-03-25 DIAGNOSIS — I509 Heart failure, unspecified: Secondary | ICD-10-CM

## 2018-03-25 DIAGNOSIS — I361 Nonrheumatic tricuspid (valve) insufficiency: Secondary | ICD-10-CM

## 2018-03-25 LAB — CBC
HCT: 42.2 % (ref 36.0–46.0)
HEMOGLOBIN: 13.2 g/dL (ref 12.0–15.0)
MCH: 30.6 pg (ref 26.0–34.0)
MCHC: 31.3 g/dL (ref 30.0–36.0)
MCV: 97.9 fL (ref 78.0–100.0)
Platelets: 164 10*3/uL (ref 150–400)
RBC: 4.31 MIL/uL (ref 3.87–5.11)
RDW: 16.8 % — ABNORMAL HIGH (ref 11.5–15.5)
WBC: 4.9 10*3/uL (ref 4.0–10.5)

## 2018-03-25 LAB — BASIC METABOLIC PANEL
ANION GAP: 9 (ref 5–15)
BUN: 12 mg/dL (ref 6–20)
CHLORIDE: 103 mmol/L (ref 98–111)
CO2: 27 mmol/L (ref 22–32)
Calcium: 8.6 mg/dL — ABNORMAL LOW (ref 8.9–10.3)
Creatinine, Ser: 0.79 mg/dL (ref 0.44–1.00)
GFR calc non Af Amer: >60 mL/min (ref >60)
Glucose, Bld: 102 mg/dL — ABNORMAL HIGH (ref 70–99)
POTASSIUM: 4.1 mmol/L (ref 3.5–5.1)
Sodium: 139 mmol/L (ref 135–145)

## 2018-03-25 LAB — HEPARIN LEVEL (UNFRACTIONATED)
HEPARIN UNFRACTIONATED: 0.16 [IU]/mL — AB (ref 0.30–0.70)
Heparin Unfractionated: 0.35 IU/mL (ref 0.30–0.70)

## 2018-03-25 LAB — PROTIME-INR
INR: 1.37
Prothrombin Time: 16.8 seconds — ABNORMAL HIGH (ref 11.4–15.2)

## 2018-03-25 LAB — GLUCOSE, CAPILLARY
GLUCOSE-CAPILLARY: 105 mg/dL — AB (ref 70–99)
GLUCOSE-CAPILLARY: 109 mg/dL — AB (ref 70–99)
Glucose-Capillary: 178 mg/dL — ABNORMAL HIGH (ref 70–99)
Glucose-Capillary: 193 mg/dL — ABNORMAL HIGH (ref 70–99)

## 2018-03-25 LAB — ECHOCARDIOGRAM COMPLETE
HEIGHTINCHES: 59 in
Weight: 3080 oz

## 2018-03-25 MED ORDER — PERFLUTREN LIPID MICROSPHERE
1.0000 mL | INTRAVENOUS | Status: AC | PRN
  Administered 2018-03-25: 2 mL via INTRAVENOUS
  Filled 2018-03-25: qty 10

## 2018-03-25 MED ORDER — WARFARIN SODIUM 5 MG PO TABS
5.0000 mg | ORAL_TABLET | Freq: Once | ORAL | Status: AC
  Administered 2018-03-25: 5 mg via ORAL
  Filled 2018-03-25: qty 1

## 2018-03-25 MED ORDER — FUROSEMIDE 40 MG PO TABS
40.0000 mg | ORAL_TABLET | Freq: Two times a day (BID) | ORAL | Status: DC
  Administered 2018-03-25 – 2018-03-29 (×8): 40 mg via ORAL
  Filled 2018-03-25 (×8): qty 1

## 2018-03-25 NOTE — Progress Notes (Signed)
  Echocardiogram 2D Echocardiogram has been performed.  Theresa [name] F 03/25/2018, 10:12 AM

## 2018-03-25 NOTE — Progress Notes (Signed)
[facility] for Heparin and Warfarin Indication: chest pain/ACS, hx DVT, lupus anticoagulant  Allergies  Allergen Reactions  . Penicillins Rash    Has patient had a PCN reaction causing immediate rash, facial/tongue/throat swelling, SOB or lightheadedness with hypotension: Yes Has patient had a PCN reaction causing severe rash involving mucus membranes or skin necrosis: No Has patient had a PCN reaction that required hospitalization No Has patient had a PCN reaction occurring within the last 10 years: No If all of the above answers are "NO", then may proceed with Cephalosporin use.   REACTION: rash  . Ciprofloxacin     nausea  . Ibuprofen Rash    swelling in leg     Patient Measurements: Height: 4\' 11"  (149.9 cm) Weight: 192 lb 8 oz (87.3 kg) IBW/kg (Calculated) : 43.2 HEPARIN DW (KG): 65  Vital Signs: Temp: 98.8 F (37.1 C) (07/20 1624) Temp Source: Oral (07/20 1624) BP: 121/73 (07/20 1624) Pulse Rate: 67 (07/20 1624)  Labs: Recent Labs    03/22/18 1930  03/22/18 2232  03/23/18 3810 03/23/18 1125 03/23/18 2020 03/24/18 0451 03/25/18 0733 03/25/18 0734 03/25/18 1723  HGB  --   --   --    < > 13.7  --   --  13.6 13.2  --   --   HCT  --   --   --   --  41.8  --   --  42.6 42.2  --   --   PLT  --   --   --   --  164  --   --  173 164  --   --   LABPROT  --   --   --   --   --  18.9*  --  17.4* 16.8*  --   --   INR  --   --   --   --   --  1.60  --  1.44 1.37  --   --   HEPARINUNFRC  --    < > 0.16*  --  0.20*  --   --  0.40  --  0.16* 0.35  CREATININE  --   --   --   --   --   --  0.96 0.97 0.79  --   --   TROPONINI <0.03  --  <0.03  --   --   --   --   --   --   --   --    < > = values in this interval not displayed.    Estimated Creatinine Clearance: 79 mL/min (by C-G formula based on SCr of 0.79 mg/dL).   Medical History: Past Medical History:  Diagnosis Date  . CAD (coronary artery disease)    a. s/p prior PCI. b.  CABG 2007 at [facility] in [place] 2007. c. inferior STEMI 10/2015 s/p DES to dSVG-PDA.  Marland Kitchen Cervical cancer ([place])   . Chronic diastolic CHF (congestive heart failure) ([place])   . Chronic respiratory failure ([place])    s/p tracheostomy 2002  . Chronic RUQ pain   . COPD (chronic obstructive pulmonary disease) ([place])   . Diabetes mellitus ([place])   . DVT (deep venous thrombosis) ([place])   . Endometriosis   . History of gallstones 01/2016   seen on Ultrasound  . History of HIDA scan 11/2016   normal  . HTN (hypertension)   . Hyperlipidemia   . Lupus anticoagulant disorder ([place])  on coumadin  . Morbid obesity ([place])   . ST elevation (STEMI) myocardial infarction involving right coronary artery ([place]) 10/29/15   stent to VG to PDA  . Tracheostomy in place [facility]), chronic since 2002 11/03/2015    Assessment: 52 yo female on chronic coumadin for previous DVT, lupus anticoagulant presenting with CP now s/p cardiac cath. Pharmacy consulted to resume warfarin on 7/19, last dose PTA 7/16 (PTA regimen is 5mg  on M/F and 2.5mg  all other days). Heparin resumed 8 hours post sheath pull on 7/19  PM heparin level therapeutic  Goal of Therapy:  INR 2-3 Heparin level 0.3-0.7 units/ml Monitor platelets by anticoagulation protocol: Yes   Plan:  Continue heparin at 1100 units / hr Daily Heparin level, INR and CBC   Thank you Anette Guarneri, PharmD [phone] 03/25/2018 6:46 PM

## 2018-03-25 NOTE — Progress Notes (Addendum)
[facility] for Heparin and Warfarin Indication: chest pain/ACS, hx DVT, lupus anticoagulant  Allergies  Allergen Reactions  . Penicillins Rash    Has patient had a PCN reaction causing immediate rash, facial/tongue/throat swelling, SOB or lightheadedness with hypotension: Yes Has patient had a PCN reaction causing severe rash involving mucus membranes or skin necrosis: No Has patient had a PCN reaction that required hospitalization No Has patient had a PCN reaction occurring within the last 10 years: No If all of the above answers are "NO", then may proceed with Cephalosporin use.   REACTION: rash  . Ciprofloxacin     nausea  . Ibuprofen Rash    swelling in leg     Patient Measurements: Height: 4\' 11"  (149.9 cm) Weight: 192 lb 8 oz (87.3 kg) IBW/kg (Calculated) : 43.2 HEPARIN DW (KG): 65  Vital Signs: Temp: 98.2 F (36.8 C) (07/20 0555) Temp Source: Oral (07/20 0555) BP: 117/76 (07/20 0910) Pulse Rate: 63 (07/20 0910)  Labs: Recent Labs    03/22/18 1434 03/22/18 1930  03/22/18 2232 03/23/18 0607 03/23/18 1125 03/23/18 2020 03/24/18 0451 03/25/18 0733 03/25/18 0734  HGB 14.0  --   --   --  13.7  --   --  13.6 13.2  --   HCT 41.8  --   --   --  41.8  --   --  42.6 42.2  --   PLT 159  --   --   --  164  --   --  173 164  --   LABPROT 19.2*  --   --   --   --  18.9*  --  17.4* 16.8*  --   INR 1.63  --   --   --   --  1.60  --  1.44 1.37  --   HEPARINUNFRC  --   --    < > 0.16* 0.20*  --   --  0.40  --  0.16*  CREATININE 0.68  --   --   --   --   --  0.96 0.97 0.79  --   TROPONINI <0.03 <0.03  --  <0.03  --   --   --   --   --   --    < > = values in this interval not displayed.    Estimated Creatinine Clearance: 79 mL/min (by C-G formula based on SCr of 0.79 mg/dL).   Medical History: Past Medical History:  Diagnosis Date  . CAD (coronary artery disease)    a. s/p prior PCI. b. CABG 2007 at [facility] in [place] 2007. c. inferior STEMI 10/2015 s/p DES to dSVG-PDA.  Marland Kitchen Cervical cancer ([place])   . Chronic diastolic CHF (congestive heart failure) ([place])   . Chronic respiratory failure ([place])    s/p tracheostomy 2002  . Chronic RUQ pain   . COPD (chronic obstructive pulmonary disease) ([place])   . Diabetes mellitus ([place])   . DVT (deep venous thrombosis) ([place])   . Endometriosis   . History of gallstones 01/2016   seen on Ultrasound  . History of HIDA scan 11/2016   normal  . HTN (hypertension)   . Hyperlipidemia   . Lupus anticoagulant disorder ([facility])    on coumadin  . Morbid obesity ([place])   . ST elevation (STEMI) myocardial infarction involving right coronary artery ([place]) 10/29/15   stent to VG to PDA  . Tracheostomy in place [facility]), chronic since 2002 11/03/2015  Assessment: 52 yo female on chronic coumadin for previous DVT, lupus anticoagulant presenting with CP now s/p cardiac cath. Pharmacy consulted to resume warfarin on 7/19, last dose PTA 7/16 (PTA regimen is 5mg  on M/F and 2.5mg  all other days). Heparin resumed 8 hours post sheath pull on 7/19  INR this AM subtherapeutic at 1.37.  HL subtherapeutic at 0.16 CBC stable, no bleeding noted.   Goal of Therapy:  INR 2-3 Heparin level 0.3-0.7 units/ml Monitor platelets by anticoagulation protocol: Yes   Plan:  Continue Warfarin 5mg  po x1 tonight Increase IV heparin to 1100 units/hr Check Heparin level in 6 hours.  Daily Heparin level, INR and CBC   Thank you for involving pharmacy in this patient's care.  Janae Bridgeman, PharmD PGY1 Pharmacy Resident Phone: [phone] 03/25/2018 11:32 AM

## 2018-03-25 NOTE — Progress Notes (Signed)
Progress Note  Patient Name: Theresa [name] Date of Encounter: 03/25/2018  Primary Cardiologist: [name], MD   Subjective   Patient with some phlegm this AM. Left femoral cath site tender. Asking when she can go home. Reviewed results with her.  Inpatient Medications    Scheduled Meds: . aspirin EC  81 mg Oral Daily  . clopidogrel  75 mg Oral Daily  . gabapentin  300-600 mg Oral BID  . insulin aspart  0-9 Units Subcutaneous TID WC  . insulin glargine  25 Units Subcutaneous QHS  . lisinopril  20 mg Oral Daily  . Living Better with Heart Failure Book   Does not apply Once  . mouth rinse  15 mL Mouth Rinse BID  . metoprolol tartrate  50 mg Oral BID  . pantoprazole  40 mg Oral Daily  . rosuvastatin  10 mg Oral Daily  . sodium chloride flush  3 mL Intravenous Q12H  . sodium chloride flush  3 mL Intravenous Q12H  . warfarin  5 mg Oral ONCE-1800  . Warfarin - Pharmacist Dosing Inpatient   Does not apply q1800   Continuous Infusions: . sodium chloride 250 mL (03/24/18 2328)  . heparin 1,100 Units/hr (03/25/18 1155)   PRN Meds: sodium chloride, acetaminophen, acetaminophen, albuterol, nitroGLYCERIN, ondansetron (ZOFRAN) IV, ondansetron (ZOFRAN) IV, oxyCODONE, sodium chloride flush, sodium chloride flush   Vital Signs    Vitals:   03/25/18 0745 03/25/18 0910 03/25/18 1235 03/25/18 1238  BP: 117/76 117/76  116/70  Pulse:  63 65 63  Resp:  18 18   Temp:    98.8 F (37.1 C)  TempSrc:    Oral  SpO2: (!) 89% 95% 94% 90%  Weight:      Height:        Intake/Output Summary (Last 24 hours) at 03/25/2018 1301 Last data filed at 03/25/2018 0554 Gross per 24 hour  Intake 768.46 ml  Output 800 ml  Net -31.54 ml   Filed Weights   03/22/18 1413 03/24/18 0500 03/25/18 0555  Weight: 200 lb (90.7 kg) 189 lb 3.2 oz (85.8 kg) 192 lb 8 oz (87.3 kg)    Telemetry    NSR - Personally Reviewed  ECG    No new ECG  Physical Exam   GEN: No acute distress.   Neck: supple,  no JVD. Tracheostomy in place. Cardiac: regular S1 and S2, no murmurs, rubs, or gallops.  Respiratory: Clear to auscultation bilaterally. GI: Soft, nontender, non-distended. Bowel sounds normal MS: trace LE bilateral edema; No deformity. Left femoral cath site tender but no hematoma, no bruit. Neuro:  Nonfocal, moves all limbs independently Psych: Normal affect   Labs    Chemistry Recent Labs  Lab 03/23/18 2020 03/24/18 0451 03/25/18 0733  NA 138 139 139  K 3.0* 4.4 4.1  CL 100 100 103  CO2 28 31 27   GLUCOSE 168* 148* 102*  BUN 10 10 12   CREATININE 0.96 0.97 0.79  CALCIUM 8.4* 8.6* 8.6*  GFRNONAA >60 >60 >60  GFRAA >60 >60 >60  ANIONGAP 10 8 9      Hematology Recent Labs  Lab 03/23/18 0607 03/24/18 0451 03/25/18 0733  WBC 5.8 5.7 4.9  RBC 4.36 4.44 4.31  HGB 13.7 13.6 13.2  HCT 41.8 42.6 42.2  MCV 95.9 95.9 97.9  MCH 31.4 30.6 30.6  MCHC 32.8 31.9 31.3  RDW 16.5* 16.5* 16.8*  PLT 164 173 164    Cardiac Enzymes Recent Labs  Lab 03/22/18 1434 03/22/18  1930 03/22/18 2232  TROPONINI <0.03 <0.03 <0.03    Recent Labs  Lab 03/22/18 1434  TROPIPOC 0.01     BNP Recent Labs  Lab 03/22/18 1434  BNP 1,049.0*     DDimer No results for input(s): DDIMER in the last 168 hours.   Radiology    No results found.  Cardiac Studies   Echo pending today  Cath 03/24/18  Severe native coronary artery disease with total occlusion of the proximal LAD, total occlusion of the proximal RCA, and patent circumflex with patent proximal stent with eccentric 50% proximal narrowing.  Distal obtuse marginal branches are severely and diffusely diseased.  Bypass graft failure with total occlusion of SVG to RCA.  Patent LIMA to LAD.  Right coronary territory is supplied by collaterals from LAD and circumflex.  Left ventricular systolic dysfunction with EF 35 to 45%.  Mid to distal inferior wall akinesis.  Inferobasal and inferoapical wall severely hypokinetic.  Left  ventricular end-diastolic pressure elevated at 19 mmHg  Moderate to severe pulmonary hypertension  RECOMMENDATIONS:   Resume Coumadin therapy overlap with heparin  Continue Plavix  Management of pulmonary status to maintain adequate oxygenation which may help decrease severity of pulmonary hypertension.  Patient Profile     52 y.o. female with PMH tracheostomy, chronic diastolic heart failure, CAD (prior PCIs and CABG 2007), diabetes, HTN, HLD, morbid obesity, COPD, VTE 2/2 lupus anticoagulant who presented with exertional chest pain. Cath showed severe native CAD with occlusion of SVG-RCA but patent LIMA-LAD. Recommendation is for medical management.  Assessment & Plan    CAD: no targets, recommendation is for medical management -on heparin drip, bridging to coumadin. INR subtherapeutic today -continue clopidogrel, aspirin -echo pending today  Acute on chronic diastolic CHF -improved with diuresis this admission. Cr stable post cath. Will restart home furosemide regimen.  Hypertension -Home medications include lisinopril 20 mg, metoprolol 50 mg twice daily, Lasix 40 mg twice daily(which she was not consistent with) -BP is currently well controlled  Hyperlipidemia -Currently LDL is 103, on rosuvastatin 10 mg daily.    History of DVT on chronic anticoagulation -Coumadin restarted, bridging on heparin. Lupus anticoagulant history.   Time Spent Directly with Patient: I have spent a total of >25 minutes with the patient reviewing hospital notes, telemetry, EKGs, labs and examining the patient as well as establishing an assessment and plan that was discussed personally with the patient.  > 50% of time was spent in direct patient care.  Length of Stay:  LOS: 3 days   Buford Dresser, MD, PhD [facility]   03/25/2018, 1:01 PM   For questions or updates, please contact [place] Please consult [email or web address] for contact info under  Cardiology/STEMI.

## 2018-03-26 DIAGNOSIS — D6862 Lupus anticoagulant syndrome: Secondary | ICD-10-CM

## 2018-03-26 DIAGNOSIS — I25110 Atherosclerotic heart disease of native coronary artery with unstable angina pectoris: Principal | ICD-10-CM

## 2018-03-26 LAB — BASIC METABOLIC PANEL
ANION GAP: 9 (ref 5–15)
BUN: 9 mg/dL (ref 6–20)
CO2: 29 mmol/L (ref 22–32)
Calcium: 8.8 mg/dL — ABNORMAL LOW (ref 8.9–10.3)
Chloride: 103 mmol/L (ref 98–111)
Creatinine, Ser: 0.87 mg/dL (ref 0.44–1.00)
GLUCOSE: 146 mg/dL — AB (ref 70–99)
POTASSIUM: 3.6 mmol/L (ref 3.5–5.1)
SODIUM: 141 mmol/L (ref 135–145)

## 2018-03-26 LAB — GLUCOSE, CAPILLARY
GLUCOSE-CAPILLARY: 111 mg/dL — AB (ref 70–99)
GLUCOSE-CAPILLARY: 155 mg/dL — AB (ref 70–99)
Glucose-Capillary: 96 mg/dL (ref 70–99)
Glucose-Capillary: 179 mg/dL — ABNORMAL HIGH (ref 70–99)

## 2018-03-26 LAB — CBC
HEMATOCRIT: 40.2 % (ref 36.0–46.0)
HEMOGLOBIN: 12.7 g/dL (ref 12.0–15.0)
MCH: 31 pg (ref 26.0–34.0)
MCHC: 31.6 g/dL (ref 30.0–36.0)
MCV: 98 fL (ref 78.0–100.0)
Platelets: 164 10*3/uL (ref 150–400)
RBC: 4.1 MIL/uL (ref 3.87–5.11)
RDW: 16.8 % — ABNORMAL HIGH (ref 11.5–15.5)
WBC: 4.2 10*3/uL (ref 4.0–10.5)

## 2018-03-26 LAB — PROTIME-INR
INR: 1.51
PROTHROMBIN TIME: 18.1 s — AB (ref 11.4–15.2)

## 2018-03-26 LAB — HEPARIN LEVEL (UNFRACTIONATED): Heparin Unfractionated: 0.45 IU/mL (ref 0.30–0.70)

## 2018-03-26 MED ORDER — WARFARIN SODIUM 2.5 MG PO TABS
2.5000 mg | ORAL_TABLET | Freq: Once | ORAL | Status: AC
  Administered 2018-03-26: 2.5 mg via ORAL
  Filled 2018-03-26: qty 1

## 2018-03-26 NOTE — Progress Notes (Addendum)
[facility] for Heparin and Warfarin Indication: chest pain/ACS, hx DVT, lupus anticoagulant  Allergies  Allergen Reactions  . Penicillins Rash    Has patient had a PCN reaction causing immediate rash, facial/tongue/throat swelling, SOB or lightheadedness with hypotension: Yes Has patient had a PCN reaction causing severe rash involving mucus membranes or skin necrosis: No Has patient had a PCN reaction that required hospitalization No Has patient had a PCN reaction occurring within the last 10 years: No If all of the above answers are "NO", then may proceed with Cephalosporin use.   REACTION: rash  . Ciprofloxacin     nausea  . Ibuprofen Rash    swelling in leg     Patient Measurements: Height: 4\' 11"  (149.9 cm) Weight: 191 lb 6.4 oz (86.8 kg) IBW/kg (Calculated) : 43.2 HEPARIN DW (KG): 65  Vital Signs: Temp: 98.6 F (37 C) (07/21 0605) Temp Source: Oral (07/21 0605) BP: 124/76 (07/21 1001) Pulse Rate: 65 (07/21 0857)  Labs: Recent Labs    03/24/18 0451 03/25/18 0733 03/25/18 0734 03/25/18 1723 03/26/18 0353  HGB 13.6 13.2  --   --  12.7  HCT 42.6 42.2  --   --  40.2  PLT 173 164  --   --  164  LABPROT 17.4* 16.8*  --   --  18.1*  INR 1.44 1.37  --   --  1.51  HEPARINUNFRC 0.40  --  0.16* 0.35 0.45  CREATININE 0.97 0.79  --   --  0.87    Estimated Creatinine Clearance: 72.4 mL/min (by C-G formula based on SCr of 0.87 mg/dL).   Medical History: Past Medical History:  Diagnosis Date  . CAD (coronary artery disease)    a. s/p prior PCI. b. CABG 2007 at [facility] in [place] 2007. c. inferior STEMI 10/2015 s/p DES to dSVG-PDA.  Marland Kitchen Cervical cancer ([place])   . Chronic diastolic CHF (congestive heart failure) ([place])   . Chronic respiratory failure ([place])    s/p tracheostomy 2002  . Chronic RUQ pain   . COPD (chronic obstructive pulmonary disease) ([place])   . Diabetes mellitus ([place])   . DVT (deep venous thrombosis) ([place])    . Endometriosis   . History of gallstones 01/2016   seen on Ultrasound  . History of HIDA scan 11/2016   normal  . HTN (hypertension)   . Hyperlipidemia   . Lupus anticoagulant disorder (H[facility])    on coumadin  . Morbid obesity ([place])   . ST elevation (STEMI) myocardial infarction involving right coronary artery ([place]) 10/29/15   stent to VG to PDA  . Tracheostomy in place [facility]), chronic since 2002 11/03/2015    Assessment: 52 yo female on chronic coumadin for previous DVT, lupus anticoagulant presenting with CP now s/p cardiac cath. Pharmacy consulted to resume warfarin on 7/19, last dose PTA 7/16 (PTA regimen is 5mg  on M/F and 2.5mg  all other days). Heparin resumed 8 hours post sheath pull on 7/19  Today heparin level continues to be therapeutic at 0.45 INR is subtherapeutic at 1.51 CBC stable, no s/sx of bleeding noted.  Goal of Therapy:  INR 2-3 Heparin level 0.3-0.7 units/ml Monitor platelets by anticoagulation protocol: Yes   Plan:  Continue heparin at 1100 units/hr Continue PTA warfarin 2.5mg  x1 tonight Daily Heparin level, INR and CBC   Thank you for involving pharmacy in this patient's care.  Janae Bridgeman, PharmD PGY1 Pharmacy Resident Phone: [phone] 03/26/2018 11:50 AM

## 2018-03-26 NOTE — Progress Notes (Addendum)
Progress Note  Patient Name: Theresa [name] Date of Encounter: 03/26/2018  Primary Cardiologist: [name], MD   Subjective   No further chest pain, no SOB, trach stable.    Inpatient Medications    Scheduled Meds: . aspirin EC  81 mg Oral Daily  . clopidogrel  75 mg Oral Daily  . furosemide  40 mg Oral BID  . gabapentin  300-600 mg Oral BID  . insulin aspart  0-9 Units Subcutaneous TID WC  . insulin glargine  25 Units Subcutaneous QHS  . lisinopril  20 mg Oral Daily  . Living Better with Heart Failure Book   Does not apply Once  . mouth rinse  15 mL Mouth Rinse BID  . metoprolol tartrate  50 mg Oral BID  . pantoprazole  40 mg Oral Daily  . rosuvastatin  10 mg Oral Daily  . sodium chloride flush  3 mL Intravenous Q12H  . sodium chloride flush  3 mL Intravenous Q12H  . Warfarin - Pharmacist Dosing Inpatient   Does not apply q1800   Continuous Infusions: . sodium chloride 10 mL/hr at 03/25/18 1400  . heparin 1,100 Units/hr (03/26/18 0006)   PRN Meds: sodium chloride, acetaminophen, acetaminophen, albuterol, nitroGLYCERIN, ondansetron (ZOFRAN) IV, ondansetron (ZOFRAN) IV, oxyCODONE, sodium chloride flush, sodium chloride flush   Vital Signs    Vitals:   03/25/18 2043 03/25/18 2107 03/25/18 2321 03/26/18 0605  BP:  115/73  106/71  Pulse: 76 64 64 (!) 58  Resp: 18 18 18 18   Temp:    98.6 F (37 C)  TempSrc:    Oral  SpO2: 95% 95% 97% 94%  Weight:    191 lb 6.4 oz (86.8 kg)  Height:        Intake/Output Summary (Last 24 hours) at 03/26/2018 0733 Last data filed at 03/26/2018 3734 Gross per 24 hour  Intake 184.31 ml  Output 1800 ml  Net -1615.69 ml   Filed Weights   03/24/18 0500 03/25/18 0555 03/26/18 0605  Weight: 189 lb 3.2 oz (85.8 kg) 192 lb 8 oz (87.3 kg) 191 lb 6.4 oz (86.8 kg)    Telemetry    SR 6 beats of NSVT  - Personally Reviewed  ECG    No new - Personally Reviewed  Physical Exam   GEN: No acute distress.   Neck: No JVD- tach in  place  Cardiac: RRR, no murmurs, rubs, or gallops.  Respiratory: few rales in bases auscultated bilaterally. GI: Soft, nontender, non-distended  MS: No edema; No deformity. Neuro:  Nonfocal  Psych: Normal affect   Labs    Chemistry Recent Labs  Lab 03/24/18 0451 03/25/18 0733 03/26/18 0353  NA 139 139 141  K 4.4 4.1 3.6  CL 100 103 103  CO2 31 27 29   GLUCOSE 148* 102* 146*  BUN 10 12 9   CREATININE 0.97 0.79 0.87  CALCIUM 8.6* 8.6* 8.8*  GFRNONAA >60 >60 >60  GFRAA >60 >60 >60  ANIONGAP 8 9 9      Hematology Recent Labs  Lab 03/24/18 0451 03/25/18 0733 03/26/18 0353  WBC 5.7 4.9 4.2  RBC 4.44 4.31 4.10  HGB 13.6 13.2 12.7  HCT 42.6 42.2 40.2  MCV 95.9 97.9 98.0  MCH 30.6 30.6 31.0  MCHC 31.9 31.3 31.6  RDW 16.5* 16.8* 16.8*  PLT 173 164 164    Cardiac Enzymes Recent Labs  Lab 03/22/18 1434 03/22/18 1930 03/22/18 2232  TROPONINI <0.03 <0.03 <0.03    Recent Labs  Lab 03/22/18 1434  TROPIPOC 0.01     BNP Recent Labs  Lab 03/22/18 1434  BNP 1,049.0*     DDimer No results for input(s): DDIMER in the last 168 hours.   Radiology    No results found.  Cardiac Studies   Cath 03/24/18  Severe native coronary artery disease with total occlusion of the proximal LAD, total occlusion of the proximal RCA, and patent circumflex with patent proximal stent with eccentric 50% proximal narrowing. Distal obtuse marginal branches are severely and diffusely diseased.  Bypass graft failure with total occlusion of SVG to RCA.  Patent LIMA to LAD.  Right coronary territory is supplied by collaterals from LAD and circumflex.  Left ventricular systolic dysfunction with EF 35 to 45%. Mid to distal inferior wall akinesis. Inferobasal and inferoapical wall severely hypokinetic. Left ventricular end-diastolic pressure elevated at 19 mmHg  Moderate to severe pulmonary hypertension  RECOMMENDATIONS:   Resume Coumadin therapy overlap with  heparin  Continue Plavix  Management of pulmonary status to maintain adequate oxygenation which may help decrease severity of pulmonary hypertension.  Echo 03/25/18 Study Conclusions  - Left ventricle: The cavity size was normal. There was mild focal   basal hypertrophy of the septum. Systolic function was normal.   The estimated ejection fraction was 50%. Doppler parameters are   consistent with a reversible restrictive pattern, indicative of   decreased left ventricular diastolic compliance and/or increased   left atrial pressure (grade 3 diastolic dysfunction). - Regional wall motion abnormality: Akinesis of the mid   anteroseptal myocardium; hypokinesis of the mid inferoseptal,   mid-apical inferior, and apical septal myocardium. - Aortic valve: Transvalvular velocity was within the normal range.   There was no stenosis. There was no regurgitation. - Mitral valve: Mobility was mildly restricted. There was mild   regurgitation. - Left atrium: The atrium was moderately dilated. - Right ventricle: The cavity size was mildly dilated. Wall   thickness was normal. Systolic function was normal. - Right atrium: The atrium was moderately dilated. - Atrial septum: No defect or patent foramen ovale was identified. - Tricuspid valve: There was mild regurgitation. - Pulmonic valve: There was no significant regurgitation. - Pulmonary arteries: PA peak pressure: 42 mm Hg (S).  Impressions:  - [place] EF about 50% with regional wall motion abnormalities as   above. Elevated right sided filling pressures.  Patient Profile     52 y.o. female with PMH tracheostomy, chronic diastolic heart failure, CAD (prior PCIs and CABG 2007), diabetes, HTN, HLD, morbid obesity, COPD, VTE 2/2 lupus anticoagulant who presented with exertional chest pain. Cath showed severe native CAD with occlusion of SVG-RCA but patent LIMA-LAD. Recommendation is for medical management.    Assessment & Plan    CAD  -no targets medical management   Hx of DVT on anticoagulation with heparin to coumadin crossover  INR is 1.51 today  Discharge when INR therapeutic  Acute on chronic diastolic HF. -- neg 3614 since admit and wt down from 200 lb to 191 lb. -- back on lasix 40 BID po   HTN  Well controlled 106/71 to 115/73   HLD, on statin continue   Chronic trach since 2002 for chronic resp. Failure.  For questions or updates, please contact [place] Please consult [email or web address] for contact info under Cardiology/STEMI.      Signed, Cecilie Kicks, NP  03/26/2018, 7:33 AM     Patient seen and examined, agree with note as above with comments below.   She  is disappointed that her INR is not therapeutic today. We discussed her risk of clotting at length, given her lupus anticoagulant disorder. Her INR trajectory is moving in the right direction. She follows closely with the anticoagulation clinic. She would really like to go home tomorrow. We discussed the risks of discharge prior to an INR of 2, including increased risk of clotting. She understands the risk, and if her INR is around 1.7 or greater, she would accept the risk of clotting to be able to go home. She would also like to talk to the case manager tomorrow to see if she can get an oxygen concentrator for home, as moving the large tanks around causes her to have chest pain. I will not be on service tomorrow, so I did explain that the final decision is up to the attending tomorrow. She understands this.  Exam is unchanged today. She is comfortable in bed. Cath site is tender but no hematoma or bruit. Trach. Coarse sounds that clear with cough.  Labs reviewed.  TIME SPENT WITH PATIENT: >25 minutes of direct patient care. More than 50% of that time was spent on coordination of care and counseling regarding her INR and risk factors.  Buford Dresser, MD, PhD [facility]

## 2018-03-27 ENCOUNTER — Encounter (HOSPITAL_COMMUNITY): Payer: Self-pay | Admitting: Interventional Cardiology

## 2018-03-27 LAB — CBC
HCT: 40.9 % (ref 36.0–46.0)
Hemoglobin: 13 g/dL (ref 12.0–15.0)
MCH: 30.8 pg (ref 26.0–34.0)
MCHC: 31.8 g/dL (ref 30.0–36.0)
MCV: 96.9 fL (ref 78.0–100.0)
PLATELETS: 163 10*3/uL (ref 150–400)
RBC: 4.22 MIL/uL (ref 3.87–5.11)
RDW: 16.6 % — ABNORMAL HIGH (ref 11.5–15.5)
WBC: 4.9 10*3/uL (ref 4.0–10.5)

## 2018-03-27 LAB — BASIC METABOLIC PANEL
Anion gap: 10 (ref 5–15)
BUN: 10 mg/dL (ref 6–20)
CHLORIDE: 101 mmol/L (ref 98–111)
CO2: 31 mmol/L (ref 22–32)
Calcium: 9 mg/dL (ref 8.9–10.3)
Creatinine, Ser: 0.87 mg/dL (ref 0.44–1.00)
GFR calc Af Amer: >60 mL/min (ref >60)
GFR calc non Af Amer: >60 mL/min (ref >60)
Glucose, Bld: 81 mg/dL (ref 70–99)
POTASSIUM: 3.8 mmol/L (ref 3.5–5.1)
Sodium: 142 mmol/L (ref 135–145)

## 2018-03-27 LAB — GLUCOSE, CAPILLARY
GLUCOSE-CAPILLARY: 208 mg/dL — AB (ref 70–99)
Glucose-Capillary: 90 mg/dL (ref 70–99)
Glucose-Capillary: 99 mg/dL (ref 70–99)
Glucose-Capillary: 207 mg/dL — ABNORMAL HIGH (ref 70–99)

## 2018-03-27 LAB — PROTIME-INR
INR: 1.65
Prothrombin Time: 19.3 seconds — ABNORMAL HIGH (ref 11.4–15.2)

## 2018-03-27 LAB — HEPARIN LEVEL (UNFRACTIONATED): Heparin Unfractionated: 0.49 IU/mL (ref 0.30–0.70)

## 2018-03-27 MED ORDER — WARFARIN SODIUM 5 MG PO TABS
5.0000 mg | ORAL_TABLET | Freq: Once | ORAL | Status: AC
  Administered 2018-03-27: 5 mg via ORAL
  Filled 2018-03-27: qty 1

## 2018-03-27 NOTE — Progress Notes (Signed)
Patient having left leg spasms from hip to foot.  I have text MD on call to see what can be given.  I will monitored patient.

## 2018-03-27 NOTE — Progress Notes (Signed)
Patient had an episode of nausea, was given Zofran at [facility], at this time she is feeling better.  Only having the spasms on left leg.  I will keep monitoring patient.

## 2018-03-27 NOTE — Progress Notes (Signed)
[place] for Heparin and Warfarin Indication: hx DVT, lupus anticoagulant  Allergies  Allergen Reactions  . Penicillins Rash    Has patient had a PCN reaction causing immediate rash, facial/tongue/throat swelling, SOB or lightheadedness with hypotension: Yes Has patient had a PCN reaction causing severe rash involving mucus membranes or skin necrosis: No Has patient had a PCN reaction that required hospitalization No Has patient had a PCN reaction occurring within the last 10 years: No If all of the above answers are "NO", then may proceed with Cephalosporin use.   REACTION: rash  . Ciprofloxacin     nausea  . Ibuprofen Rash    swelling in leg     Patient Measurements: Height: 4\' 11"  (149.9 cm) Weight: 191 lb 3.2 oz (86.7 kg) IBW/kg (Calculated) : 43.2 HEPARIN DW (KG): 65  Vital Signs: Temp: 98.7 F (37.1 C) (07/22 0548) Temp Source: Oral (07/22 0548) BP: 127/78 (07/22 0959) Pulse Rate: 61 (07/22 0822)  Labs: Recent Labs    03/25/18 0733  03/25/18 1723 03/26/18 0353 03/27/18 0708  HGB 13.2  --   --  12.7 13.0  HCT 42.2  --   --  40.2 40.9  PLT 164  --   --  164 163  LABPROT 16.8*  --   --  18.1* 19.3*  INR 1.37  --   --  1.51 1.65  HEPARINUNFRC  --    < > 0.35 0.45 0.49  CREATININE 0.79  --   --  0.87 0.87   < > = values in this interval not displayed.    Estimated Creatinine Clearance: 72.4 mL/min (by C-G formula based on SCr of 0.87 mg/dL).   Medical History: Past Medical History:  Diagnosis Date  . CAD (coronary artery disease)    a. s/p prior PCI. b. CABG 2007 at [facility] in [place] 2007. c. inferior STEMI 10/2015 s/p DES to dSVG-PDA.  Marland Kitchen Cervical cancer ([place])   . Chronic diastolic CHF (congestive heart failure) ([place])   . Chronic respiratory failure ([place])    s/p tracheostomy 2002  . Chronic RUQ pain   . COPD (chronic obstructive pulmonary disease) ([place])   . Diabetes mellitus ([place])   . DVT (deep  venous thrombosis) ([place])   . Endometriosis   . History of gallstones 01/2016   seen on Ultrasound  . History of HIDA scan 11/2016   normal  . HTN (hypertension)   . Hyperlipidemia   . Lupus anticoagulant disorder (HCC)    on coumadin  . Morbid obesity ([place])   . ST elevation (STEMI) myocardial infarction involving right coronary artery ([place]) 10/29/15   stent to VG to PDA  . Tracheostomy in place [facility]), chronic since 2002 11/03/2015    Assessment: 52 yo female on chronic coumadin for previous DVT, lupus anticoagulant presenting with CP now s/p cardiac cath. Pharmacy consulted to resume warfarin on 7/19, last dose PTA 7/16 (PTA regimen is 5mg  on M/F and 2.5mg  all other days). Heparin resumed 8 hours post sheath pull on 7/19  Today heparin level continues to be therapeutic at 0.49 INR is subtherapeutic but trending up.  CBC stable  Goal of Therapy:  INR 2-3 Heparin level 0.3-0.7 units/ml Monitor platelets by anticoagulation protocol: Yes   Plan:  Continue heparin at 1100 units/hr Warfarin 5 mg x 1 dose today   Daily Heparin level, INR and CBC   Thank you for involving pharmacy in this patient's care.  Albertina Parr, PharmD., BCPS  Clinical Pharmacist Clinical phone for 03/27/18 until 3:30pm: [phone] If after 3:30pm, please refer to [facility] for unit-specific pharmacist

## 2018-03-27 NOTE — Progress Notes (Addendum)
Progress Note  Patient Name: Theresa [name] Date of Encounter: 03/27/2018  Primary Cardiologist: [name], MD   Subjective   No chest pain and no SOB, rested well.   Inpatient Medications    Scheduled Meds: . aspirin EC  81 mg Oral Daily  . clopidogrel  75 mg Oral Daily  . furosemide  40 mg Oral BID  . gabapentin  300-600 mg Oral BID  . insulin aspart  0-9 Units Subcutaneous TID WC  . insulin glargine  25 Units Subcutaneous QHS  . lisinopril  20 mg Oral Daily  . Living Better with Heart Failure Book   Does not apply Once  . mouth rinse  15 mL Mouth Rinse BID  . metoprolol tartrate  50 mg Oral BID  . pantoprazole  40 mg Oral Daily  . rosuvastatin  10 mg Oral Daily  . sodium chloride flush  3 mL Intravenous Q12H  . sodium chloride flush  3 mL Intravenous Q12H  . Warfarin - Pharmacist Dosing Inpatient   Does not apply q1800   Continuous Infusions: . sodium chloride Stopped (03/25/18 2121)  . heparin 1,100 Units/hr (03/27/18 0600)   PRN Meds: sodium chloride, acetaminophen, acetaminophen, albuterol, nitroGLYCERIN, ondansetron (ZOFRAN) IV, ondansetron (ZOFRAN) IV, oxyCODONE, sodium chloride flush, sodium chloride flush   Vital Signs    Vitals:   03/26/18 2129 03/26/18 2200 03/27/18 0339 03/27/18 0548  BP: (!) 109/48   97/77  Pulse: 62  64 (!) 56  Resp: 18  18 18   Temp:  98.5 F (36.9 C)  98.7 F (37.1 C)  TempSrc:  Oral  Oral  SpO2: 91%  99% 93%  Weight:    191 lb 3.2 oz (86.7 kg)  Height:        Intake/Output Summary (Last 24 hours) at 03/27/2018 0755 Last data filed at 03/27/2018 0600 Gross per 24 hour  Intake 1353.31 ml  Output 3425 ml  Net -2071.69 ml   Filed Weights   03/25/18 0555 03/26/18 0605 03/27/18 0548  Weight: 192 lb 8 oz (87.3 kg) 191 lb 6.4 oz (86.8 kg) 191 lb 3.2 oz (86.7 kg)    Telemetry    SR with 5 beats NSVT  - Personally Reviewed  ECG    No new - Personally Reviewed  Physical Exam   GEN: No acute distress.   Neck: No  JVD- trach stable Cardiac: RRR, no murmurs, rubs, or gallops.  Respiratory: Clear to few rales in bases otherwise to auscultation bilaterally. GI: Soft, nontender, non-distended  MS: No edema; No deformity. Neuro:  Nonfocal  Psych: Normal affect   Labs    Chemistry Recent Labs  Lab 03/24/18 0451 03/25/18 0733 03/26/18 0353  NA 139 139 141  K 4.4 4.1 3.6  CL 100 103 103  CO2 31 27 29   GLUCOSE 148* 102* 146*  BUN 10 12 9   CREATININE 0.97 0.79 0.87  CALCIUM 8.6* 8.6* 8.8*  GFRNONAA >60 >60 >60  GFRAA >60 >60 >60  ANIONGAP 8 9 9      Hematology Recent Labs  Lab 03/24/18 0451 03/25/18 0733 03/26/18 0353  WBC 5.7 4.9 4.2  RBC 4.44 4.31 4.10  HGB 13.6 13.2 12.7  HCT 42.6 42.2 40.2  MCV 95.9 97.9 98.0  MCH 30.6 30.6 31.0  MCHC 31.9 31.3 31.6  RDW 16.5* 16.8* 16.8*  PLT 173 164 164    Cardiac Enzymes Recent Labs  Lab 03/22/18 1434 03/22/18 1930 03/22/18 2232  TROPONINI <0.03 <0.03 <0.03  Recent Labs  Lab 03/22/18 1434  TROPIPOC 0.01     BNP Recent Labs  Lab 03/22/18 1434  BNP 1,049.0*     DDimer No results for input(s): DDIMER in the last 168 hours.   Radiology    No results found.  Cardiac Studies   Cath 03/24/18  Severe native coronary artery disease with total occlusion of the proximal LAD, total occlusion of the proximal RCA, and patent circumflex with patent proximal stent with eccentric 50% proximal narrowing. Distal obtuse marginal branches are severely and diffusely diseased.  Bypass graft failure with total occlusion of SVG to RCA.  Patent LIMA to LAD.  Right coronary territory is supplied by collaterals from LAD and circumflex.  Left ventricular systolic dysfunction with EF 35 to 45%. Mid to distal inferior wall akinesis. Inferobasal and inferoapical wall severely hypokinetic. Left ventricular end-diastolic pressure elevated at 19 mmHg  Moderate to severe pulmonary hypertension  RECOMMENDATIONS:   Resume Coumadin  therapy overlap with heparin  Continue Plavix  Management of pulmonary status to maintain adequate oxygenation which may help decrease severity of pulmonary hypertension.  Echo 03/25/18 Study Conclusions  - Left ventricle: The cavity size was normal. There was mild focal basal hypertrophy of the septum. Systolic function was normal. The estimated ejection fraction was 50%. Doppler parameters are consistent with a reversible restrictive pattern, indicative of decreased left ventricular diastolic compliance and/or increased left atrial pressure (grade 3 diastolic dysfunction). - Regional wall motion abnormality: Akinesis of the mid anteroseptal myocardium; hypokinesis of the mid inferoseptal, mid-apical inferior, and apical septal myocardium. - Aortic valve: Transvalvular velocity was within the normal range. There was no stenosis. There was no regurgitation. - Mitral valve: Mobility was mildly restricted. There was mild regurgitation. - Left atrium: The atrium was moderately dilated. - Right ventricle: The cavity size was mildly dilated. Wall thickness was normal. Systolic function was normal. - Right atrium: The atrium was moderately dilated. - Atrial septum: No defect or patent foramen ovale was identified. - Tricuspid valve: There was mild regurgitation. - Pulmonic valve: There was no significant regurgitation. - Pulmonary arteries: PA peak pressure: 42 mm Hg (S).  Impressions:  - Globale EF about 50% with regional wall motion abnormalities as above. Elevated right sided filling pressures.    Patient Profile     52 y.o. female with PMH tracheostomy, chronic diastolic heart failure, CAD (prior PCIs and CABG 2007), diabetes, HTN, HLD, morbid obesity, COPD, VTE 2/2 lupus anticoagulant who presented with exertional chest pain. Cath showed severe native CAD with occlusion of SVG-RCA but patent LIMA-LAD. Recommendation is for medical  management.    Assessment & Plan    CAD -no targets medical management  No further chest pain.  Hx of DVT on anticoagulation with heparin to coumadin crossover  INR is 1.65 today  Discharge when INR therapeutic  Acute on chronic diastolic HF. -- neg [id number] since admit and wt down from 200 lb to 191 lb. -- back on lasix 40 BID po   HTN  Well controlled 106/71 to 115/73   HLD, on statin continue   Chronic trach since 2002 for chronic resp. Failure. --needs portable oxygen for discharge -oreder sent to Care manager.      For questions or updates, please contact [place] Please consult [email or web address] for contact info under Cardiology/STEMI.      Signed, Cecilie Kicks, NP  03/27/2018, 7:55 AM    Personally seen and examined. Agree with above.  Continue with medical management for her CAD  however just waiting for her INR to become therapeutic because of her history of DVT and lupus anticoagulant.  Obviously this is frustrating for her.  We discussed.  On exam, chronic trach noted.  She is sitting up in bed comfortable.  Moves all extremities.  INR today 1.65.  Candee Furbish, MD

## 2018-03-28 ENCOUNTER — Inpatient Hospital Stay (HOSPITAL_COMMUNITY): Payer: Medicare Other

## 2018-03-28 LAB — BASIC METABOLIC PANEL
Anion gap: 9 (ref 5–15)
BUN: 13 mg/dL (ref 6–20)
CALCIUM: 9.1 mg/dL (ref 8.9–10.3)
CHLORIDE: 95 mmol/L — AB (ref 98–111)
CO2: 34 mmol/L — ABNORMAL HIGH (ref 22–32)
CREATININE: 0.91 mg/dL (ref 0.44–1.00)
Glucose, Bld: 114 mg/dL — ABNORMAL HIGH (ref 70–99)
Potassium: 4.1 mmol/L (ref 3.5–5.1)
SODIUM: 138 mmol/L (ref 135–145)

## 2018-03-28 LAB — GLUCOSE, CAPILLARY
GLUCOSE-CAPILLARY: 129 mg/dL — AB (ref 70–99)
Glucose-Capillary: 192 mg/dL — ABNORMAL HIGH (ref 70–99)
Glucose-Capillary: 149 mg/dL — ABNORMAL HIGH (ref 70–99)
Glucose-Capillary: 215 mg/dL — ABNORMAL HIGH (ref 70–99)

## 2018-03-28 LAB — CBC
HCT: 42.1 % (ref 36.0–46.0)
HEMOGLOBIN: 13.2 g/dL (ref 12.0–15.0)
MCH: 30.8 pg (ref 26.0–34.0)
MCHC: 31.4 g/dL (ref 30.0–36.0)
MCV: 98.4 fL (ref 78.0–100.0)
Platelets: 191 10*3/uL (ref 150–400)
RBC: 4.28 MIL/uL (ref 3.87–5.11)
RDW: 16.5 % — ABNORMAL HIGH (ref 11.5–15.5)
WBC: 5.3 10*3/uL (ref 4.0–10.5)

## 2018-03-28 LAB — PROTIME-INR
INR: 1.66
PROTHROMBIN TIME: 19.4 s — AB (ref 11.4–15.2)

## 2018-03-28 LAB — HEPARIN LEVEL (UNFRACTIONATED): HEPARIN UNFRACTIONATED: 0.55 [IU]/mL (ref 0.30–0.70)

## 2018-03-28 MED ORDER — WARFARIN SODIUM 5 MG PO TABS
7.0000 mg | ORAL_TABLET | Freq: Once | ORAL | Status: AC
  Administered 2018-03-28: 7 mg via ORAL
  Filled 2018-03-28: qty 1

## 2018-03-28 NOTE — Progress Notes (Signed)
Unable to wean FiO2 otherwise patient would desat in the mid 80's. 40% ATC is tolerated well at this time. Pt is resting comfortably at this time no distress or complications noted.

## 2018-03-28 NOTE — Progress Notes (Signed)
Progress Note  Patient Name: Theresa [name] Date of Encounter: 03/28/2018  Primary Cardiologist: [name], MD   Subjective   Feels well, currently breathing well, no chest pain  Inpatient Medications    Scheduled Meds: . aspirin EC  81 mg Oral Daily  . clopidogrel  75 mg Oral Daily  . furosemide  40 mg Oral BID  . gabapentin  300-600 mg Oral BID  . insulin aspart  0-9 Units Subcutaneous TID WC  . insulin glargine  25 Units Subcutaneous QHS  . lisinopril  20 mg Oral Daily  . Living Better with Heart Failure Book   Does not apply Once  . mouth rinse  15 mL Mouth Rinse BID  . metoprolol tartrate  50 mg Oral BID  . pantoprazole  40 mg Oral Daily  . rosuvastatin  10 mg Oral Daily  . sodium chloride flush  3 mL Intravenous Q12H  . sodium chloride flush  3 mL Intravenous Q12H  . Warfarin - Pharmacist Dosing Inpatient   Does not apply q1800   Continuous Infusions: . sodium chloride Stopped (03/25/18 2121)  . heparin 1,100 Units/hr (03/28/18 5883)   PRN Meds: sodium chloride, acetaminophen, acetaminophen, albuterol, nitroGLYCERIN, ondansetron (ZOFRAN) IV, ondansetron (ZOFRAN) IV, oxyCODONE, sodium chloride flush, sodium chloride flush   Vital Signs    Vitals:   03/27/18 2133 03/27/18 2145 03/28/18 0011 03/28/18 0521  BP: 124/74   (!) 100/52  Pulse: 72 71  (!) 56  Resp:    18  Temp:    98.7 F (37.1 C)  TempSrc:    Oral  SpO2:  95% 91% 96%  Weight:    191 lb 4.8 oz (86.8 kg)  Height:        Intake/Output Summary (Last 24 hours) at 03/28/2018 0858 Last data filed at 03/28/2018 2549 Gross per 24 hour  Intake 1945.76 ml  Output 1000 ml  Net 945.76 ml   Filed Weights   03/26/18 0605 03/27/18 0548 03/28/18 0521  Weight: 191 lb 6.4 oz (86.8 kg) 191 lb 3.2 oz (86.7 kg) 191 lb 4.8 oz (86.8 kg)    Telemetry    Sinus rhythm- Personally Reviewed  ECG    No new- Personally Reviewed  Physical Exam   GEN: No acute distress.   Neck: No JVD tracheostomy MS:  No edema; No deformity. Neuro:  Nonfocal  Psych: Normal affect   Labs    Chemistry Recent Labs  Lab 03/25/18 0733 03/26/18 0353 03/27/18 0708  NA 139 141 142  K 4.1 3.6 3.8  CL 103 103 101  CO2 27 29 31   GLUCOSE 102* 146* 81  BUN 12 9 10   CREATININE 0.79 0.87 0.87  CALCIUM 8.6* 8.8* 9.0  GFRNONAA >60 >60 >60  GFRAA >60 >60 >60  ANIONGAP 9 9 10      Hematology Recent Labs  Lab 03/25/18 0733 03/26/18 0353 03/27/18 0708  WBC 4.9 4.2 4.9  RBC 4.31 4.10 4.22  HGB 13.2 12.7 13.0  HCT 42.2 40.2 40.9  MCV 97.9 98.0 96.9  MCH 30.6 31.0 30.8  MCHC 31.3 31.6 31.8  RDW 16.8* 16.8* 16.6*  PLT 164 164 163    Cardiac Enzymes Recent Labs  Lab 03/22/18 1434 03/22/18 1930 03/22/18 2232  TROPONINI <0.03 <0.03 <0.03    Recent Labs  Lab 03/22/18 1434  TROPIPOC 0.01     BNP Recent Labs  Lab 03/22/18 1434  BNP 1,049.0*     DDimer No results for input(s): DDIMER in the  last 168 hours.   Radiology    No results found.  Cardiac Studies   EF 50%, mild pulmonary hypertension 42 mmHg, grade 3 diastolic dysfunction  Patient Profile     52 y.o. female with PMH tracheostomy, chronic diastolic heart failure, CAD (prior PCIs and CABG 2007), diabetes, HTN, HLD, morbid obesity, COPD, VTE 2/2 lupus anticoagulant who presented with exertional chest pain. Cath showed severe native CAD with occlusion of SVG-RCA but patent LIMA-LAD. Recommendation is for medical management.    Assessment & Plan    DVT with lupus anticoagulant history -Awaiting INR therapeutic.  Awaiting labs today.  Bridging with heparin.  Yesterday INR 1.65.  Coronary artery disease status post CABG - Occluded SVG to RCA but patent LIMA to LAD.  Medical management.  Acute on chronic diastolic heart failure - -6 L since admission cumulative weight 200 on admission, currently 191.  Essential hypertension hyperlipidemia -Currently well controlled.  Statin.  Tracheostomy since 2002 - No changes.  If  INR is therapeutic, okay for discharge.  For questions or updates, please contact [place] Please consult [email or web address] for contact info under Cardiology/STEMI.      Signed, Candee Furbish, MD  03/28/2018, 8:58 AM

## 2018-03-28 NOTE — Care Management Note (Addendum)
Case Management Note  Patient Details  Name: Theresa [name] MRN: [id number] Date of Birth: [birth year]  Subjective/Objective:  Pt presented for Chest Pain-post cath plan to medical manage. PTA from home with support of husband. Pt has home fill concentrator for 02 at home with E tanks. Pt asking for portable 02 tank. Plan for Simply Go portable tank. CM did verify with agency that the simply go can be placed on the back or as a caddy.                  Action/Plan: Pt will benefit from [facility] RN, PT, Aide and CSW- pt wants to use [facility] for Services. Pt will need HH orders &  F2F once stable to transition home. Pt will need an order forSimply Go Oxygen Tank and in comments secton please state why patient will need the portable tank in order for Medicare to approve. CM did make referral with [facility] for [place] and DME- Simply go hopefully can be delivered to patient's room. No further needs from CM at this time.  Expected Discharge Date:                  Expected Discharge Plan:  [place]  In-House Referral:  NA  Discharge planning Services  CM Consult  Post Acute Care Choice:  Home Health, Durable Medical Equipment Choice offered to:  Patient  DME Arranged:  Oxygen(Portable Tank Simply [employer] ) DME Agency:  [place]:  RN, Disease Management, Nurse's Aide, PT, Social Work [employer] Agency:  [place]  Status of Service:  Completed, signed off  If discussed at [employer], dates discussed:    Additional Comments: 1428 03-29-18 Jacqlyn Krauss, RN, BSN [phone] CM did speak with [facility] in regards to the Simply Go-unit will not go up to 5 Liters. Order was asked to be placed in Epic for 02 evaluation for home. Patient is aware that she will not have portable tank for home. [facility] will follow. [place] Orders to be placed. No further needs from CM at this time.  [name], RN 03/28/2018, 11:40 AM

## 2018-03-28 NOTE — Progress Notes (Signed)
Called into room due to unwitnessed fall. Found patient lying on the floor, alert, oriented, at her baseline. Stated "was at the sink doing trach care,,,, walking back to bed leg gave out and fell" Pt unsure if she hit her head or not, on warfarin and heparin drip currently infusing at 11 ml/hr. Stormy Fabian, PA paged and made aware. Came in to assess.

## 2018-03-28 NOTE — Progress Notes (Addendum)
[place] for Heparin and Warfarin Indication: hx DVT, lupus anticoagulant  Allergies  Allergen Reactions  . Penicillins Rash    Has patient had a PCN reaction causing immediate rash, facial/tongue/throat swelling, SOB or lightheadedness with hypotension: Yes Has patient had a PCN reaction causing severe rash involving mucus membranes or skin necrosis: No Has patient had a PCN reaction that required hospitalization No Has patient had a PCN reaction occurring within the last 10 years: No If all of the above answers are "NO", then may proceed with Cephalosporin use.   REACTION: rash  . Ciprofloxacin     nausea  . Ibuprofen Rash    swelling in leg     Patient Measurements: Height: 4\' 11"  (149.9 cm) Weight: 191 lb 4.8 oz (86.8 kg) IBW/kg (Calculated) : 43.2 HEPARIN DW (KG): 65  Vital Signs: Temp: 98.7 F (37.1 C) (07/23 0521) Temp Source: Oral (07/23 0521) BP: 100/52 (07/23 0521) Pulse Rate: 54 (07/23 1100)  Labs: Recent Labs    03/26/18 0353 03/27/18 0708 03/28/18 0842  HGB 12.7 13.0 13.2  HCT 40.2 40.9 42.1  PLT 164 163 191  LABPROT 18.1* 19.3* 19.4*  INR 1.51 1.65 1.66  HEPARINUNFRC 0.45 0.49 0.55  CREATININE 0.87 0.87 0.91    Estimated Creatinine Clearance: 69.2 mL/min (by C-G formula based on SCr of 0.91 mg/dL).   Medical History: Past Medical History:  Diagnosis Date  . CAD (coronary artery disease)    a. s/p prior PCI. b. CABG 2007 at [facility] in [place] 2007. c. inferior STEMI 10/2015 s/p DES to dSVG-PDA.  Marland Kitchen Cervical cancer ([place])   . Chronic diastolic CHF (congestive heart failure) ([place])   . Chronic respiratory failure ([place])    s/p tracheostomy 2002  . Chronic RUQ pain   . COPD (chronic obstructive pulmonary disease) ([place])   . Diabetes mellitus ([place])   . DVT (deep venous thrombosis) ([place])   . Endometriosis   . History of gallstones 01/2016   seen on Ultrasound  . History of HIDA scan 11/2016   normal  . HTN (hypertension)   . Hyperlipidemia   . Lupus anticoagulant disorder (HCC)    on coumadin  . Morbid obesity ([place])   . ST elevation (STEMI) myocardial infarction involving right coronary artery ([place]) 10/29/15   stent to VG to PDA  . Tracheostomy in place [facility]), chronic since 2002 11/03/2015    Assessment: 52 yo female on chronic coumadin for previous DVT, lupus anticoagulant presenting with CP now s/p cardiac cath. Pharmacy consulted to resume warfarin on 7/19, last dose PTA 7/16 (PTA regimen is 5mg  on M/F and 2.5mg  all other days). Heparin resumed 8 hours post sheath pull on 7/19.  Today heparin level continues to be therapeutic at 0.55 INR is subtherapeutic but trending up CBC stable  Goal of Therapy:  INR 2-3 Heparin level 0.3-0.7 units/ml Monitor platelets by anticoagulation protocol: Yes   Plan:  Continue heparin at 1100 units/hr Warfarin 7 mg x 1 dose today   Daily Heparin level, INR and CBC   Thank you for involving pharmacy in this patient's care.  Isaias Sakai, Sherian Rein D PGY1 Pharmacy Resident  Phone [phone] 03/28/2018      11:08 AM

## 2018-03-28 NOTE — Care Management Important Message (Signed)
Important Message  Patient Details  Name: Theresa [name] MRN: [id number] Date of Birth: 1965/10/22   Medicare Important Message Given:  Yes    [name], RN 03/28/2018, 11:22 AM

## 2018-03-28 NOTE — Progress Notes (Signed)
    CT of head OK  Left foot with 5th proximal phalanx fracture. Spoke to Dr. Marlou Sa, ortho on call, recommended a cast shoe and follow up in clinic as outpatient.   I ordered a post op shoe on Epic.   Candee Furbish, MD

## 2018-03-28 NOTE — Progress Notes (Signed)
Called to bedside following a fall. Patient was at the sink completing trach care when she was walking to return to her bed and felt her left leg "give out." She is not sure if she hit her head but the IV pole landed on her left foot and now her left foot hurts. She is neurologically intact on my assessment. Will order stat head CT and left foot xray.   Tami Lin [place], PA-C 03/28/2018, 4:23 PM [phone]

## 2018-03-29 ENCOUNTER — Encounter (HOSPITAL_COMMUNITY): Payer: Self-pay | Admitting: Cardiology

## 2018-03-29 DIAGNOSIS — I272 Pulmonary hypertension, unspecified: Secondary | ICD-10-CM

## 2018-03-29 DIAGNOSIS — S92912A Unspecified fracture of left toe(s), initial encounter for closed fracture: Secondary | ICD-10-CM | POA: Diagnosis not present

## 2018-03-29 DIAGNOSIS — S92911A Unspecified fracture of right toe(s), initial encounter for closed fracture: Secondary | ICD-10-CM

## 2018-03-29 HISTORY — DX: Unspecified fracture of right toe(s), initial encounter for closed fracture: S92.911A

## 2018-03-29 LAB — PROTIME-INR
INR: 1.86
Prothrombin Time: 21.3 seconds — ABNORMAL HIGH (ref 11.4–15.2)

## 2018-03-29 LAB — CBC
HCT: 40.9 % (ref 36.0–46.0)
HEMOGLOBIN: 12.8 g/dL (ref 12.0–15.0)
MCH: 30.8 pg (ref 26.0–34.0)
MCHC: 31.3 g/dL (ref 30.0–36.0)
MCV: 98.3 fL (ref 78.0–100.0)
Platelets: 203 10*3/uL (ref 150–400)
RBC: 4.16 MIL/uL (ref 3.87–5.11)
RDW: 16.4 % — AB (ref 11.5–15.5)
WBC: 6.5 10*3/uL (ref 4.0–10.5)

## 2018-03-29 LAB — GLUCOSE, CAPILLARY
GLUCOSE-CAPILLARY: 123 mg/dL — AB (ref 70–99)
Glucose-Capillary: 83 mg/dL (ref 70–99)

## 2018-03-29 LAB — BASIC METABOLIC PANEL
Anion gap: 8 (ref 5–15)
BUN: 14 mg/dL (ref 6–20)
CO2: 35 mmol/L — AB (ref 22–32)
Calcium: 9 mg/dL (ref 8.9–10.3)
Chloride: 97 mmol/L — ABNORMAL LOW (ref 98–111)
Creatinine, Ser: 1.06 mg/dL — ABNORMAL HIGH (ref 0.44–1.00)
GFR, EST NON AFRICAN AMERICAN: 59 mL/min — AB (ref >60)
GLUCOSE: 168 mg/dL — AB (ref 70–99)
POTASSIUM: 4 mmol/L (ref 3.5–5.1)
Sodium: 140 mmol/L (ref 135–145)

## 2018-03-29 LAB — HEPARIN LEVEL (UNFRACTIONATED): Heparin Unfractionated: 0.47 IU/mL (ref 0.30–0.70)

## 2018-03-29 MED ORDER — WARFARIN SODIUM 5 MG PO TABS
5.0000 mg | ORAL_TABLET | Freq: Once | ORAL | Status: AC
  Administered 2018-03-29: 5 mg via ORAL
  Filled 2018-03-29: qty 1

## 2018-03-29 MED ORDER — OXYCODONE HCL 5 MG PO TABS: 5.0000 mg | ORAL_TABLET | ORAL | 0 refills | Status: DC | PRN

## 2018-03-29 MED ORDER — ROSUVASTATIN CALCIUM 10 MG PO TABS: 10.0000 mg | ORAL_TABLET | Freq: Every day | ORAL | 6 refills | Status: DC

## 2018-03-29 MED ORDER — ASPIRIN 81 MG PO TBEC: 81.0000 mg | DELAYED_RELEASE_TABLET | Freq: Every day | ORAL | Status: DC

## 2018-03-29 NOTE — Progress Notes (Signed)
Orthopedic Tech Progress Note Patient Details:  Theresa [name] [birth year] [id number]  Ortho Devices Type of Ortho Device: Postop shoe/boot Ortho Device/Splint Location: lle Ortho Device/Splint Interventions: Application   Post Interventions Patient Tolerated: Well Instructions Provided: Care of device   [name] 03/29/2018, 8:26 AM

## 2018-03-29 NOTE — Progress Notes (Signed)
[place] [facility] for Heparin and Warfarin Indication: hx DVT, lupus anticoagulant  Allergies  Allergen Reactions  . Penicillins Rash    Has patient had a PCN reaction causing immediate rash, facial/tongue/throat swelling, SOB or lightheadedness with hypotension: Yes Has patient had a PCN reaction causing severe rash involving mucus membranes or skin necrosis: No Has patient had a PCN reaction that required hospitalization No Has patient had a PCN reaction occurring within the last 10 years: No If all of the above answers are "NO", then may proceed with Cephalosporin use.   REACTION: rash  . Ciprofloxacin     nausea  . Ibuprofen Rash    swelling in leg     Patient Measurements: Height: 4\' 11"  (149.9 cm) Weight: 173 lb 1 oz (78.5 kg) IBW/kg (Calculated) : 43.2 HEPARIN DW (KG): 65  Vital Signs: Temp: 97.6 F (36.4 C) (07/24 0500) Temp Source: Oral (07/24 0500) BP: 90/57 (07/24 0854) Pulse Rate: 56 (07/24 0854)  Labs: Recent Labs    03/27/18 0708 03/28/18 0842 03/29/18 0339  HGB 13.0 13.2 12.8  HCT 40.9 42.1 40.9  PLT 163 191 203  LABPROT 19.3* 19.4* 21.3*  INR 1.65 1.66 1.86  HEPARINUNFRC 0.49 0.55 0.47  CREATININE 0.87 0.91 1.06*    Estimated Creatinine Clearance: 56.2 mL/min (A) (by C-G formula based on SCr of 1.06 mg/dL (H)).   Medical History: Past Medical History:  Diagnosis Date  . CAD (coronary artery disease)    a. s/p prior PCI. b. CABG 2007 at [facility] in [place] 2007. c. inferior STEMI 10/2015 s/p DES to dSVG-PDA.  Marland Kitchen Cervical cancer ([place])   . Chronic diastolic CHF (congestive heart failure) ([place])   . Chronic respiratory failure ([place])    s/p tracheostomy 2002  . Chronic RUQ pain   . COPD (chronic obstructive pulmonary disease) ([place])   . Diabetes mellitus ([place])   . DVT (deep venous thrombosis) ([place])   . Endometriosis   . History of gallstones 01/2016   seen on Ultrasound  . History of HIDA scan 11/2016    normal  . HTN (hypertension)   . Hyperlipidemia   . Lupus anticoagulant disorder ([facility])    on coumadin  . Morbid obesity ([place])   . ST elevation (STEMI) myocardial infarction involving right coronary artery ([place]) 10/29/15   stent to VG to PDA  . Tracheostomy in place [facility]), chronic since 2002 11/03/2015    Assessment: 52 yo female on chronic coumadin for previous DVT, lupus anticoagulant presenting with CP now s/p cardiac cath. Pharmacy consulted to resume warfarin on 7/19, last dose PTA 7/16 (PTA regimen is 5mg  on M/F and 2.5mg  all other days). Heparin resumed 8 hours post sheath pull on 7/19.  Patient sustained a fall yesterday. CT of head was negative for bleed. Nursing affirmed no signs/symptoms of bleeding or large bruising from fall.  Today heparin level continues to be therapeutic at 0.47 INR is subtherapeutic at 1.86 but trending up CBC stable  Goal of Therapy:  INR 2-3 Heparin level 0.3-0.7 units/ml Monitor platelets by anticoagulation protocol: Yes   Plan:  Continue heparin at 1100 units/hr Warfarin 5 mg x 1 dose today   Daily Heparin level, INR and CBC   If patient discharges today recommend continuing bridging with Lovenox 120mg  (1.5mg /kg/daily) [place] daily and home dose of warfarin (2.5mg  daily execept 5mg  Monday and Friday) with follow up INR out patient Friday.  Since patient sustained fall recommend coming back to hospital if any  signs of bleeding are observed.   Thank you for involving pharmacy in this patient's care.  Isaias Sakai, Sherian Rein D PGY1 Pharmacy Resident  Phone [phone] 03/29/2018      9:08 AM

## 2018-03-29 NOTE — Discharge Summary (Addendum)
Discharge Summary    Patient ID: Theresa [name],  MRN: [id number], DOB/AGE: Feb 11, 1966 52 y.o.  Admit date: 03/22/2018 Discharge date: 03/29/2018  Primary Care Provider: Monico Blitz Primary Cardiologist: [name], MD  Discharge Diagnoses    Principal Problem:   CORONARY ARTERY BYPASS GRAFT, HX OF Active Problems:   CAD (coronary artery disease) of artery bypass graft: PTCA/DES to distal body VG to PDA 10/29/15   DM (diabetes mellitus) ([name])   Hyperlipidemia LDL goal <70   Essential hypertension   CAD, NATIVE VESSEL   Lupus anticoagulant syndrome ([place])   Tracheostomy in place [facility]), chronic since 2002   Uncontrolled type 2 diabetes mellitus with hyperglycemia, with long-term current use of insulin (HCC)   Acute on chronic diastolic CHF (congestive heart failure) (HCC)   Unstable angina ([place])   Superficial femoral artery injury   Pulmonary hypertension, unspecified (HCC)   Toe fracture, left 5th phalangeal phalanx   Allergies Allergies  Allergen Reactions  . Penicillins Rash    Has patient had a PCN reaction causing immediate rash, facial/tongue/throat swelling, SOB or lightheadedness with hypotension: Yes Has patient had a PCN reaction causing severe rash involving mucus membranes or skin necrosis: No Has patient had a PCN reaction that required hospitalization No Has patient had a PCN reaction occurring within the last 10 years: No If all of the above answers are "NO", then may proceed with Cephalosporin use.   REACTION: rash  . Ciprofloxacin     nausea  . Ibuprofen Rash    swelling in leg     Diagnostic Studies/Procedures    Cardiac Cath 03/24/18  Severe native coronary artery disease with total occlusion of the proximal LAD, total occlusion of the proximal RCA, and patent circumflex with patent proximal stent with eccentric 50% proximal narrowing.  Distal obtuse marginal branches are severely and diffusely diseased.  Bypass graft failure with total  occlusion of SVG to RCA.  Patent LIMA to LAD.  Right coronary territory is supplied by collaterals from LAD and circumflex.  Left ventricular systolic dysfunction with EF 35 to 45%.  Mid to distal inferior wall akinesis.  Inferobasal and inferoapical wall severely hypokinetic.  Left ventricular end-diastolic pressure elevated at 19 mmHg  Moderate to severe pulmonary hypertension  RECOMMENDATIONS:   Resume Coumadin therapy overlap with heparin  Continue Plavix  Management of pulmonary status to maintain adequate oxygenation which may help decrease severity of pulmonary hypertension.   Recommend to resume Warfarin, at currently prescribed dose and frequency, on today.  Recommend concurrent antiplatelet therapy of Clopidogrel 75mg  daily for Indefinite.  Echo 03/25/18    _____Study Conclusions  - Left ventricle: The cavity size was normal. There was mild focal   basal hypertrophy of the septum. Systolic function was normal.   The estimated ejection fraction was 50%. Doppler parameters are   consistent with a reversible restrictive pattern, indicative of   decreased left ventricular diastolic compliance and/or increased   left atrial pressure (grade 3 diastolic dysfunction). - Regional wall motion abnormality: Akinesis of the mid   anteroseptal myocardium; hypokinesis of the mid inferoseptal,   mid-apical inferior, and apical septal myocardium. - Aortic valve: Transvalvular velocity was within the normal range.   There was no stenosis. There was no regurgitation. - Mitral valve: Mobility was mildly restricted. There was mild   regurgitation. - Left atrium: The atrium was moderately dilated. - Right ventricle: The cavity size was mildly dilated. Wall   thickness was normal. Systolic function was  normal. - Right atrium: The atrium was moderately dilated. - Atrial septum: No defect or patent foramen ovale was identified. - Tricuspid valve: There was mild regurgitation. -  Pulmonic valve: There was no significant regurgitation. - Pulmonary arteries: PA peak pressure: 42 mm Hg (S).  Impressions:  - [place] EF about 50% with regional wall motion abnormalities as   above. Elevated right sided filling pressures.  ------------------------------------------------------------------- Labs, prior tests, procedures, and surgery: Catheterization (current admission).    The study demonstrated coronary artery disease.  Coronary artery bypass grafting.__  ______   History of Present Illness     36 yoF with hx of Trach dependent respiratory failure, chronic diastolic HF, CAD with Hx CABG and now chest pain.  She had CHF and chest pain.  Admitted to [facility] to treat HF. Pt had only been taking lasix once a day.    Hospital Course     Consultants: Cardiology   Pt was seen by Dr. Harl Bowie and she had diuresed well her coumadin was held and she was placed on IV heparin.  Pt transferred to [facility] for cardiac cath.  See above.  Plans for medical therapy.    Pt had continued diuresis.  She had total neg 6,823 and wt down from 200 lbs to 173 lbs.  Unfortunately while waiting for Coumadin and heparin crossover pt stated her lt leg gave way and she fell she did have fx of 5th toe and Dr. Marlou Sa of Ortho was made aware and boot added.  Her Head CT was neg for bleed.    We had PT eval before discharge.  Her INR was I.8 and both pt and Dr. Marlou Porch felt she was safe to be discharged.  She will take coumadin 5 mg 03/29/18 then back to home dose.  She will have INR with PCP on Friday the 26th.  She will wear boot on Lt foot.  She will have oxygen and HH PT RN, aide, and SW   _____________  Discharge Vitals Blood pressure (!) 90/57, pulse (!) 58, temperature 97.6 F (36.4 C), temperature source Oral, resp. rate 16, height 4\' 11"  (1.499 m), weight 173 lb 1 oz (78.5 kg), SpO2 96 %.  Filed Weights   03/27/18 0548 03/28/18 0521 03/29/18 0500  Weight: 191 lb 3.2 oz (86.7 kg) 191 lb 4.8  oz (86.8 kg) 173 lb 1 oz (78.5 kg)    Labs & Radiologic Studies    CBC Recent Labs    03/28/18 0842 03/29/18 0339  WBC 5.3 6.5  HGB 13.2 12.8  HCT 42.1 40.9  MCV 98.4 98.3  PLT 191 875   Basic Metabolic Panel Recent Labs    03/28/18 0842 03/29/18 0339  NA 138 140  K 4.1 4.0  CL 95* 97*  CO2 34* 35*  GLUCOSE 114* 168*  BUN 13 14  CREATININE 0.91 1.06*  CALCIUM 9.1 9.0   Liver Function Tests No results for input(s): AST, ALT, ALKPHOS, BILITOT, PROT, ALBUMIN in the last 72 hours. No results for input(s): LIPASE, AMYLASE in the last 72 hours. Cardiac Enzymes No results for input(s): CKTOTAL, CKMB, CKMBINDEX, TROPONINI in the last 72 hours. BNP Invalid input(s): POCBNP D-Dimer No results for input(s): DDIMER in the last 72 hours. Hemoglobin A1C No results for input(s): HGBA1C in the last 72 hours. Fasting Lipid Panel No results for input(s): CHOL, HDL, LDLCALC, TRIG, CHOLHDL, LDLDIRECT in the last 72 hours. Thyroid Function Tests No results for input(s): TSH, T4TOTAL, T3FREE, THYROIDAB in the last  72 hours.  Invalid input(s): FREET3 _____________  Dg Chest 2 View  Result Date: 03/22/2018 CLINICAL DATA:  Initial evaluation for acute chest pain. EXAM: CHEST - 2 VIEW COMPARISON:  Prior radiograph from 11/05/2017 FINDINGS: Median sternotomy wires underlying CABG markers. Moderate cardiomegaly. Coronary stent noted. Mediastinal silhouette within normal limits. Tracheostomy tube in place overlying the upper airway, tip well positioned 1.9 cm above the carina. Lungs hypoinflated. Suture material noted at the peripheral right lung base. Elevation of the lateral hemidiaphragms bilaterally, similar to previous, which could be chronic and/or reflect small sub pulmonic effusions. Small amount of fluid trapped along the right minor fissure. Diffuse vascular congestion with interstitial prominence, suggesting mild pulmonary interstitial edema. Few slightly more nodular densities  overlie the peripheral right upper lobe, which may reflect edema and/or atelectasis. Possible superimposed infection could be considered in the correct clinical setting. No acute osseous abnormality. IMPRESSION: 1. Cardiomegaly with mild diffuse pulmonary interstitial edema, suggesting CHF. Possible small pleural effusions. 2. Few scattered nodular densities overlying the peripheral right upper lobe, indeterminate. While these findings may reflect edema and/or atelectatic changes, possible superimposed infection could be considered in the correct clinical setting. Radiographic follow-up to resolution recommended to ensure no underlying pulmonary nodule present, in which case cross-sectional imaging would be recommended for further evaluation. Electronically Signed   By: Jeannine Boga M.D.   On: 03/22/2018 15:53   Ct Head Wo Contrast  Result Date: 03/28/2018 CLINICAL DATA:  Leg gave out. Head injury. History of cervical cancer, diabetes, hypertension, hyperlipidemia, lupus. EXAM: CT HEAD WITHOUT CONTRAST TECHNIQUE: Contiguous axial images were obtained from the base of the skull through the vertex without intravenous contrast. COMPARISON:  CT HEAD May 20, 2015 FINDINGS: BRAIN: No intraparenchymal hemorrhage, mass effect nor midline shift. The ventricles and sulci are normal. No acute large vascular territory infarcts. No abnormal extra-axial fluid collections. Basal cisterns are patent. VASCULAR: Mild calcific atherosclerosis. SKULL/SOFT TISSUES: No skull fracture. No significant soft tissue swelling. Stable RIGHT frontal scalp lipoma. ORBITS/SINUSES: The included ocular globes and orbital contents are normal.Frothy sphenoid secretions. Mastoid air cells are well aerated. OTHER: None. IMPRESSION: 1. No acute intracranial process. 2. Mild atherosclerosis, otherwise negative noncontrast CT HEAD. Electronically Signed   By: Elon Alas M.D.   On: 03/28/2018 18:05   Dg Foot 2 Views Left  Result  Date: 03/28/2018 CLINICAL DATA:  Recent fall with foot pain, initial encounter EXAM: LEFT FOOT - 2 VIEW COMPARISON:  None. FINDINGS: There is a transverse fracture through the base of the fifth proximal phalanx with mild angulation at the fracture site. No other fractures are seen. Very mild soft tissues swelling is noted distally. Calcaneal spurring is seen. IMPRESSION: Fifth proximal phalangeal fracture. Electronically Signed   By: Inez Catalina M.D.   On: 03/28/2018 18:32   Disposition   Pt is being discharged home today in good condition.  Follow-up Plans & Appointments   Call [facility] at [phone] if any bleeding, swelling or drainage at cath site.  May shower, no tub baths for 48 hours for groin sticks. No lifting over 5 pounds for 3 days.  No Driving for 3 days  Weigh daily and wt increases by 3 pounds in a day or 5 llbs in a week please call Dr. Rosezella [place] office  Low salt diabetic diet   Follow up with Dr. Marlou Sa April 21, 2018 at 9:00AM   Call to verify appt.     Follow-up Information    Health,  [facility] Follow up.   Specialty:  Home Health Services Why:  Registered Nurse, Physical Therapy, Aide, Social Worker Contact information: [address] [place] [address] [phone]        [place] Follow up.   Why:  Oxygen- simply go.  Contact information: [address] [place] [address] [phone]        [name], MD Follow up on 04/20/2018.   Specialty:  Cardiology Why:  at 9:20 AM  Contact information: [address] [place] [address] [phone]        Monico Blitz, MD Follow up.   Specialty:  Internal Medicine Why:  have INR checked on Friday with Dr. Vela Prose information: [place] [address] [phone]            Discharge Medications   Allergies as of 03/29/2018      Reactions   Penicillins Rash   Has patient had  a PCN reaction causing immediate rash, facial/tongue/throat swelling, SOB or lightheadedness with hypotension: Yes Has patient had a PCN reaction causing severe rash involving mucus membranes or skin necrosis: No Has patient had a PCN reaction that required hospitalization No Has patient had a PCN reaction occurring within the last 10 years: No If all of the above answers are "NO", then may proceed with Cephalosporin use. REACTION: rash   Ciprofloxacin    nausea   Ibuprofen Rash   swelling in leg      Medication List    TAKE these medications   albuterol 108 (90 Base) MCG/ACT inhaler Commonly known as:  PROVENTIL HFA;VENTOLIN HFA Inhale 1-2 puffs into the lungs every 6 (six) hours as needed for wheezing or shortness of breath.   albuterol (2.5 MG/3ML) 0.083% nebulizer solution Commonly known as:  PROVENTIL Take 3 mLs (2.5 mg total) by nebulization every 4 (four) hours as needed for wheezing or shortness of breath.   aspirin 81 MG EC tablet Take 1 tablet (81 mg total) by mouth daily. Start taking on:  03/30/2018   clopidogrel 75 MG tablet Commonly known as:  PLAVIX TAKE 1 TABLET BY MOUTH ONCE DAILY **NEED  OFFICE  VISIT**   furosemide 40 MG tablet Commonly known as:  LASIX Take 1 tablet (40 mg total) by mouth 2 (two) times daily.   gabapentin 300 MG capsule Commonly known as:  NEURONTIN Take 300-600 mg by mouth 2 (two) times daily.   lisinopril 20 MG tablet Commonly known as:  PRINIVIL,ZESTRIL TAKE 1 TABLET BY MOUTH ONCE DAILY   metoprolol tartrate 50 MG tablet Commonly known as:  LOPRESSOR Take 50 mg by mouth 2 (two) times daily.   nitroGLYCERIN 0.4 MG SL tablet Commonly known as:  NITROSTAT Place 0.4 mg under the tongue every 5 (five) minutes as needed for chest pain.   pantoprazole 40 MG tablet Commonly known as:  PROTONIX Take 40 mg by mouth daily.   rosuvastatin 10 MG tablet Commonly known as:  CRESTOR Take 1 tablet (10 mg total) by mouth daily. Start  taking on:  03/30/2018 What changed:    medication strength  how much to take   TRESIBA FLEXTOUCH 100 UNIT/ML Sopn FlexTouch Pen Generic drug:  insulin degludec Inject 25 Units into the skin daily at 10 pm.   warfarin 5 MG tablet Commonly known as:  COUMADIN Take 2.5-5 mg by mouth See admin instructions. 5mg  on Monday and Friday; 2.5 mg rest of the days  Durable Medical Equipment  (From admission, onward)        Start     Ordered   03/29/18 1432  For home use only DME Other see comment  Once    Comments:  Pt will need a portable 02 evaluation post transition home.   03/29/18 1432   03/28/18 1528  For home use only DME oxygen  Once    Comments:  Simply Go Oxygen Tank with liter flow ranges up to 5L Patient needs portable tank  Question Answer Comment  Mode or (Route) Nasal cannula   Liters per Minute 5   Frequency Continuous (stationary and portable oxygen unit needed)   Oxygen delivery system Gas      03/28/18 1528       Acute coronary syndrome (MI, NSTEMI, STEMI, etc) this admission?: No.    Outstanding Labs/Studies   BMP  Duration of Discharge Encounter   Greater than 30 minutes including physician time.  Signed, Cecilie Kicks, NP 03/29/2018, 5:28 PM   Personally seen and examined. Agree with above.   Primary Cardiologist: [name], MD   Subjective   Doing well, no chest pain, no shortness of breath.  Resting well.  She has not ambulated since fall without assistance.  We will have physical therapy see her.  Head CT is reassuring.  Inpatient Medications    Scheduled Meds: . aspirin EC  81 mg Oral Daily  . clopidogrel  75 mg Oral Daily  . furosemide  40 mg Oral BID  . gabapentin  300-600 mg Oral BID  . insulin aspart  0-9 Units Subcutaneous TID WC  . insulin glargine  25 Units Subcutaneous QHS  . lisinopril  20 mg Oral Daily  . Living Better with Heart Failure Book   Does not apply Once  . mouth rinse  15 mL Mouth Rinse BID   . metoprolol tartrate  50 mg Oral BID  . pantoprazole  40 mg Oral Daily  . rosuvastatin  10 mg Oral Daily  . sodium chloride flush  3 mL Intravenous Q12H  . sodium chloride flush  3 mL Intravenous Q12H  . Warfarin - Pharmacist Dosing Inpatient   Does not apply q1800   Continuous Infusions: . sodium chloride Stopped (03/25/18 2121)  . heparin 1,100 Units/hr (03/28/18 2125)   PRN Meds: sodium chloride, acetaminophen, acetaminophen, albuterol, nitroGLYCERIN, ondansetron (ZOFRAN) IV, ondansetron (ZOFRAN) IV, oxyCODONE, sodium chloride flush, sodium chloride flush   Vital Signs          Vitals:   03/28/18 2127 03/28/18 2315 03/29/18 0328 03/29/18 0500  BP: 115/65   (!) 85/64  Pulse: 70 81 66 (!) 54  Resp:  18 18 18   Temp:    97.6 F (36.4 C)  TempSrc:    Oral  SpO2:  93% 92% 100%  Weight:    173 lb 1 oz (78.5 kg)  Height:        Intake/Output Summary (Last 24 hours) at 03/29/2018 0850 Last data filed at 03/29/2018 0500    Gross per 24 hour  Intake 959.09 ml  Output 1000 ml  Net -40.91 ml        Filed Weights   03/27/18 0548 03/28/18 0521 03/29/18 0500  Weight: 191 lb 3.2 oz (86.7 kg) 191 lb 4.8 oz (86.8 kg) 173 lb 1 oz (78.5 kg)    Telemetry    No adverse arrhythmias.  Normal rhythm.- Personally Reviewed  ECG    Sinus bradycardia.- Personally Reviewed  Physical Exam  GEN:No acute distress.   Neck:No JVD tracheostomy Cardiac:RRR, no murmurs, rubs, or gallops.  Respiratory:Clear to auscultation bilaterally. GH:WEXH, nontender, non-distended  MS:No edema; No deformity.  Has left foot orthopedic device on table. Neuro:Nonfocal , questionable left hip flexion mild weakness Psych: Normal affect   Labs    Chemistry LastLabs       Recent Labs  Lab 03/27/18 0708 03/28/18 0842 03/29/18 0339  NA 142 138 140  K 3.8 4.1 4.0  CL 101 95* 97*  CO2 31 34* 35*  GLUCOSE 81 114* 168*  BUN 10 13 14   CREATININE 0.87 0.91  1.06*  CALCIUM 9.0 9.1 9.0  GFRNONAA >60 >60 59*  GFRAA >60 >60 >60  ANIONGAP 10 9 8        Hematology LastLabs  Recent Labs  Lab 03/27/18 0708 03/28/18 0842 03/29/18 0339  WBC 4.9 5.3 6.5  RBC 4.22 4.28 4.16  HGB 13.0 13.2 12.8  HCT 40.9 42.1 40.9  MCV 96.9 98.4 98.3  MCH 30.8 30.8 30.8  MCHC 31.8 31.4 31.3  RDW 16.6* 16.5* 16.4*  PLT 163 191 203      Cardiac Enzymes LastLabs       Recent Labs  Lab 03/22/18 1434 03/22/18 1930 03/22/18 2232  TROPONINI <0.03 <0.03 <0.03      LastLabs     Recent Labs  Lab 03/22/18 1434  TROPIPOC 0.01       BNP LastLabs  Recent Labs  Lab 03/22/18 1434  BNP 1,049.0*       DDimer  LastLabs  No results for input(s): DDIMER in the last 168 hours.     Radiology     ImagingResults(Last48hours)  Ct Head Wo Contrast  Result Date: 03/28/2018 CLINICAL DATA:  Leg gave out. Head injury. History of cervical cancer, diabetes, hypertension, hyperlipidemia, lupus. EXAM: CT HEAD WITHOUT CONTRAST TECHNIQUE: Contiguous axial images were obtained from the base of the skull through the vertex without intravenous contrast. COMPARISON:  CT HEAD May 20, 2015 FINDINGS: BRAIN: No intraparenchymal hemorrhage, mass effect nor midline shift. The ventricles and sulci are normal. No acute large vascular territory infarcts. No abnormal extra-axial fluid collections. Basal cisterns are patent. VASCULAR: Mild calcific atherosclerosis. SKULL/SOFT TISSUES: No skull fracture. No significant soft tissue swelling. Stable RIGHT frontal scalp lipoma. ORBITS/SINUSES: The included ocular globes and orbital contents are normal.Frothy sphenoid secretions. Mastoid air cells are well aerated. OTHER: None. IMPRESSION: 1. No acute intracranial process. 2. Mild atherosclerosis, otherwise negative noncontrast CT HEAD. Electronically Signed   By: Elon Alas M.D.   On: 03/28/2018 18:05   Dg Foot 2 Views Left  Result Date:  03/28/2018 CLINICAL DATA:  Recent fall with foot pain, initial encounter EXAM: LEFT FOOT - 2 VIEW COMPARISON:  None. FINDINGS: There is a transverse fracture through the base of the fifth proximal phalanx with mild angulation at the fracture site. No other fractures are seen. Very mild soft tissues swelling is noted distally. Calcaneal spurring is seen. IMPRESSION: Fifth proximal phalangeal fracture. Electronically Signed   By: Inez Catalina M.D.   On: 03/28/2018 18:32     Cardiac Studies   EF 50%.  Patient Profile     52 y.o. female PMH tracheostomy, chronic diastolic heart failure, CAD (prior PCIs and CABG 2007), diabetes, HTN, HLD, morbid obesity, COPD, VTE 2/2 lupus anticoagulantwho presented with exertional chest pain. Cath showed severe native CAD with occlusion of SVG-RCA but patent LIMA-LAD. Recommendation is for medical management.     Assessment & Plan  History of deep venous thrombosis with lupus anticoagulant -Awaiting therapeutic INR.  Currently 1.86.  Increasing.  On IV heparin.  I would be comfortable with her being discharged at this level with close outpatient follow-up  Coronary artery disease status post CABG -SVG to RCA occluded but patent LIMA to LAD, continue with current medical management.  Acute on chronic diastolic heart failure - Stable, breathing is stable.  She was diuresed 6 L a proximally during this admission.  Essential hypertension with hyperlipidemia -Well-controlled, statin therapy.  Tracheostomy since 2002 -No changes.  Fall sustained, hit head - Head CT was negative for bleed.  Foot pain was assessed with x-ray which demonstrated a proximal fifth phalangeal phalanx fracture.  I discussed case with Dr. Alphonzo Severance of orthopedic surgery.  Boot has been placed/shoe.  She will obtain follow-up as outpatient, we will help set up.    See appointment above.  She had some left leg cramping 2 nights ago.  She also felt some intermittent left  leg weakness when she fell she states that her leg did not want to cooperate.  Currently on exam she appears to be neurologically intact.  We had physical therapy assess her prior to discharge.  Okay for discharge.For questions or updates, please contact [place] Please consult [email or web address] for contact info under Cardiology/STEMI.      Signed, Candee Furbish, MD

## 2018-03-29 NOTE — Progress Notes (Signed)
Progress Note  Patient Name: Theresa [name] Date of Encounter: 03/29/2018  Primary Cardiologist: [name], MD   Subjective   Doing well, no chest pain, no shortness of breath.  Resting well.  She has not ambulated since fall without assistance.  We will have physical therapy see her.  Head CT is reassuring.  Inpatient Medications    Scheduled Meds: . aspirin EC  81 mg Oral Daily  . clopidogrel  75 mg Oral Daily  . furosemide  40 mg Oral BID  . gabapentin  300-600 mg Oral BID  . insulin aspart  0-9 Units Subcutaneous TID WC  . insulin glargine  25 Units Subcutaneous QHS  . lisinopril  20 mg Oral Daily  . Living Better with Heart Failure Book   Does not apply Once  . mouth rinse  15 mL Mouth Rinse BID  . metoprolol tartrate  50 mg Oral BID  . pantoprazole  40 mg Oral Daily  . rosuvastatin  10 mg Oral Daily  . sodium chloride flush  3 mL Intravenous Q12H  . sodium chloride flush  3 mL Intravenous Q12H  . Warfarin - Pharmacist Dosing Inpatient   Does not apply q1800   Continuous Infusions: . sodium chloride Stopped (03/25/18 2121)  . heparin 1,100 Units/hr (03/28/18 2125)   PRN Meds: sodium chloride, acetaminophen, acetaminophen, albuterol, nitroGLYCERIN, ondansetron (ZOFRAN) IV, ondansetron (ZOFRAN) IV, oxyCODONE, sodium chloride flush, sodium chloride flush   Vital Signs    Vitals:   03/28/18 2127 03/28/18 2315 03/29/18 0328 03/29/18 0500  BP: 115/65   (!) 85/64  Pulse: 70 81 66 (!) 54  Resp:  18 18 18   Temp:    97.6 F (36.4 C)  TempSrc:    Oral  SpO2:  93% 92% 100%  Weight:    173 lb 1 oz (78.5 kg)  Height:        Intake/Output Summary (Last 24 hours) at 03/29/2018 0850 Last data filed at 03/29/2018 0500 Gross per 24 hour  Intake 959.09 ml  Output 1000 ml  Net -40.91 ml   Filed Weights   03/27/18 0548 03/28/18 0521 03/29/18 0500  Weight: 191 lb 3.2 oz (86.7 kg) 191 lb 4.8 oz (86.8 kg) 173 lb 1 oz (78.5 kg)    Telemetry    No adverse  arrhythmias.  Normal rhythm.- Personally Reviewed  ECG    Sinus bradycardia.- Personally Reviewed  Physical Exam   GEN: No acute distress.   Neck: No JVD tracheostomy Cardiac: RRR, no murmurs, rubs, or gallops.  Respiratory: Clear to auscultation bilaterally. GI: Soft, nontender, non-distended  MS: No edema; No deformity.  Has left foot orthopedic device on table. Neuro:  Nonfocal , questionable left hip flexion mild weakness Psych: Normal affect   Labs    Chemistry Recent Labs  Lab 03/27/18 0708 03/28/18 0842 03/29/18 0339  NA 142 138 140  K 3.8 4.1 4.0  CL 101 95* 97*  CO2 31 34* 35*  GLUCOSE 81 114* 168*  BUN 10 13 14   CREATININE 0.87 0.91 1.06*  CALCIUM 9.0 9.1 9.0  GFRNONAA >60 >60 59*  GFRAA >60 >60 >60  ANIONGAP 10 9 8      Hematology Recent Labs  Lab 03/27/18 0708 03/28/18 0842 03/29/18 0339  WBC 4.9 5.3 6.5  RBC 4.22 4.28 4.16  HGB 13.0 13.2 12.8  HCT 40.9 42.1 40.9  MCV 96.9 98.4 98.3  MCH 30.8 30.8 30.8  MCHC 31.8 31.4 31.3  RDW 16.6* 16.5* 16.4*  PLT 163 191 203    Cardiac Enzymes Recent Labs  Lab 03/22/18 1434 03/22/18 1930 03/22/18 2232  TROPONINI <0.03 <0.03 <0.03    Recent Labs  Lab 03/22/18 1434  TROPIPOC 0.01     BNP Recent Labs  Lab 03/22/18 1434  BNP 1,049.0*     DDimer No results for input(s): DDIMER in the last 168 hours.   Radiology    Ct Head Wo Contrast  Result Date: 03/28/2018 CLINICAL DATA:  Leg gave out. Head injury. History of cervical cancer, diabetes, hypertension, hyperlipidemia, lupus. EXAM: CT HEAD WITHOUT CONTRAST TECHNIQUE: Contiguous axial images were obtained from the base of the skull through the vertex without intravenous contrast. COMPARISON:  CT HEAD May 20, 2015 FINDINGS: BRAIN: No intraparenchymal hemorrhage, mass effect nor midline shift. The ventricles and sulci are normal. No acute large vascular territory infarcts. No abnormal extra-axial fluid collections. Basal cisterns are patent.  VASCULAR: Mild calcific atherosclerosis. SKULL/SOFT TISSUES: No skull fracture. No significant soft tissue swelling. Stable RIGHT frontal scalp lipoma. ORBITS/SINUSES: The included ocular globes and orbital contents are normal.Frothy sphenoid secretions. Mastoid air cells are well aerated. OTHER: None. IMPRESSION: 1. No acute intracranial process. 2. Mild atherosclerosis, otherwise negative noncontrast CT HEAD. Electronically Signed   By: Elon Alas M.D.   On: 03/28/2018 18:05   Dg Foot 2 Views Left  Result Date: 03/28/2018 CLINICAL DATA:  Recent fall with foot pain, initial encounter EXAM: LEFT FOOT - 2 VIEW COMPARISON:  None. FINDINGS: There is a transverse fracture through the base of the fifth proximal phalanx with mild angulation at the fracture site. No other fractures are seen. Very mild soft tissues swelling is noted distally. Calcaneal spurring is seen. IMPRESSION: Fifth proximal phalangeal fracture. Electronically Signed   By: Inez Catalina M.D.   On: 03/28/2018 18:32    Cardiac Studies   EF 50%.  Patient Profile     52 y.o. female PMH tracheostomy, chronic diastolic heart failure, CAD (prior PCIs and CABG 2007), diabetes, HTN, HLD, morbid obesity, COPD, VTE 2/2 lupus anticoagulant who presented with exertional chest pain. Cath showed severe native CAD with occlusion of SVG-RCA but patent LIMA-LAD. Recommendation is for medical management.     Assessment & Plan    History of deep venous thrombosis with lupus anticoagulant -Awaiting therapeutic INR.  Currently 1.86.  Increasing.  On IV heparin.  I would be comfortable with her being discharged at this level with close outpatient follow-up  Coronary artery disease status post CABG -SVG to RCA occluded but patent LIMA to LAD, continue with current medical management.  Acute on chronic diastolic heart failure - Stable, breathing is stable.  She was diuresed 6 L a proximally during this admission.  Essential hypertension with  hyperlipidemia -Well-controlled, statin therapy.  Tracheostomy since 2002 -No changes.  Fall sustained, hit head - Head CT was negative for bleed.  Foot pain was assessed with x-ray which demonstrated a proximal fifth phalangeal phalanx fracture.  I discussed case with Dr. Alphonzo Severance of orthopedic surgery.  Boot has been placed/shoe.  She will obtain follow-up as outpatient, we will help set up.  She had some left leg cramping 2 nights ago.  She also felt some intermittent left leg weakness when she fell she states that her leg did not want to cooperate.  Currently on exam she appears to be neurologically intact.  We will have physical therapy assess her prior to discharge.  Possible discharge later today if stable.  For questions or updates,  please contact [facility] Please consult [email or web address] for contact info under Cardiology/STEMI.      Signed, Candee Furbish, MD  03/29/2018, 8:50 AM

## 2018-03-29 NOTE — Discharge Instructions (Signed)
Call [facility] at [phone] if any bleeding, swelling or drainage at cath site.  May shower, no tub baths for 48 hours for groin sticks. No lifting over 5 pounds for 3 days.  No Driving for 3 days  Weigh daily and wt increases by 3 pounds in a day or 5 llbs in a week please call Dr. Rosezella [place] office  Low salt diabetic diet   Follow up with Dr. Marlou Sa April 21, 2018 at 9:00AM   Call to verify appt.   Take 5 mg of Coumadin today and then back to regular dose.  Have INR checked on Friday this week   Information on my medicine - Coumadin   (Warfarin)  Why was Coumadin prescribed for you? Coumadin was prescribed for you because you have a blood clot or a medical condition that can cause an increased risk of forming blood clots. Blood clots can cause serious health problems by blocking the flow of blood to the heart, lung, or brain. Coumadin can prevent harmful blood clots from forming. As a reminder your indication for Coumadin is:   Deep Vein Thrombosis Treatment  What test will check on my response to Coumadin? While on Coumadin (warfarin) you will need to have an INR test regularly to ensure that your dose is keeping you in the desired range. The INR (international normalized ratio) number is calculated from the result of the laboratory test called prothrombin time (PT).  If an INR APPOINTMENT HAS NOT ALREADY BEEN MADE FOR YOU please schedule an appointment to have this lab work done by your health care provider within 7 days. Your INR goal is usually a number between:  2 to 3 or your provider may give you a more narrow range like 2-2.5.  Ask your health care provider during an office visit what your goal INR is.  What  do you need to  know  About  COUMADIN? Take Coumadin (warfarin) exactly as prescribed by your healthcare provider about the same time each day.  DO NOT stop taking without talking to the doctor who prescribed the medication.  Stopping without other  blood clot prevention medication to take the place of Coumadin may increase your risk of developing a new clot or stroke.  Get refills before you run out.  What do you do if you miss a dose? If you miss a dose, take it as soon as you remember on the same day then continue your regularly scheduled regimen the next day.  Do not take two doses of Coumadin at the same time.  Important Safety Information A possible side effect of Coumadin (Warfarin) is an increased risk of bleeding. You should call your healthcare provider right away if you experience any of the following: ? Bleeding from an injury or your nose that does not stop. ? Unusual colored urine (red or dark brown) or unusual colored stools (red or black). ? Unusual bruising for unknown reasons. ? A serious fall or if you hit your head (even if there is no bleeding).  Some foods or medicines interact with Coumadin (warfarin) and might alter your response to warfarin. To help avoid this: ? Eat a balanced diet, maintaining a consistent amount of Vitamin K. ? Notify your provider about major diet changes you plan to make. ? Avoid alcohol or limit your intake to 1 drink for women and 2 drinks for men per day. (1 drink is 5 oz. wine, 12 oz. beer, or 1.5 oz. liquor.)  Make sure that ANY health care provider who prescribes medication for you knows that you are taking Coumadin (warfarin).  Also make sure the healthcare provider who is monitoring your Coumadin knows when you have started a new medication including herbals and non-prescription products.  Coumadin (Warfarin)  Major Drug Interactions  Increased Warfarin Effect Decreased Warfarin Effect  Alcohol (large quantities) Antibiotics (esp. Septra/Bactrim, Flagyl, Cipro) Amiodarone (Cordarone) Aspirin (ASA) Cimetidine (Tagamet) Megestrol (Megace) NSAIDs (ibuprofen, naproxen, etc.) Piroxicam (Feldene) Propafenone (Rythmol SR) Propranolol (Inderal) Isoniazid (INH) Posaconazole  (Noxafil) Barbiturates (Phenobarbital) Carbamazepine (Tegretol) Chlordiazepoxide (Librium) Cholestyramine (Questran) Griseofulvin Oral Contraceptives Rifampin Sucralfate (Carafate) Vitamin K   Coumadin (Warfarin) Major Herbal Interactions  Increased Warfarin Effect Decreased Warfarin Effect  Garlic Ginseng Ginkgo biloba Coenzyme Q10 Green tea [place]. Johns wort    Coumadin (Warfarin) FOOD Interactions  Eat a consistent number of servings per week of foods HIGH in Vitamin K (1 serving =  cup)  Collards (cooked, or boiled & drained) Kale (cooked, or boiled & drained) Mustard greens (cooked, or boiled & drained) Parsley *serving size only =  cup Spinach (cooked, or boiled & drained) Swiss chard (cooked, or boiled & drained) Turnip greens (cooked, or boiled & drained)  Eat a consistent number of servings per week of foods MEDIUM-HIGH in Vitamin K (1 serving = 1 cup)  Asparagus (cooked, or boiled & drained) Broccoli (cooked, boiled & drained, or raw & chopped) Brussel sprouts (cooked, or boiled & drained) *serving size only =  cup Lettuce, raw (green leaf, endive, romaine) Spinach, raw Turnip greens, raw & chopped   These websites have more information on Coumadin (warfarin):  FailFactory.se; VeganReport.com.au;

## 2018-03-29 NOTE — Evaluation (Signed)
Physical Therapy Evaluation Patient Details Name: Theresa [name] MRN: [id number] DOB: 1965/12/15 Today's Date: 03/29/2018   History of Present Illness  Pt is a 52 y.o. female admitted 03/22/18 with several weeks substernal chest pain and LE swelling; admitted for acute on chronic HF. S/p R/L heart cath 7/19 (femoral access) showing CAD; continue with current medical management. Pt sustained unwitnessed fall ambulating in room 7/23; head CT negative for acute abnormality, L foot xray showed proximal 5th phalangeal phalanx fx. PMH includes DVT with lupus anticoagulant, HF, CAD s/p CABG, HTN, tracheostomy (since 2002), chronic home O2 use.    Clinical Impression  Pt presents with an overall decrease in functional mobility secondary to above. PTA, pt indep and lives at home with husband. Evaluating pt today after she sustained unwitnessed fall ambulating independently in room yesterday due to "left leg giving out." Pt endorses pain and altered sensation in L toe (s/p L 5th phalanx fx), L ankle and L anterior thigh; LLE strength grossly 4/5, with L knee ext 3/5. Pt with initial L knee buckling upon standing. Once cued for increased WB through BUE on RW and decreased stance time on LLE, pt able to amb short distance in room with min guard and no knee instability noted. Educ on use of RW at home and w/c as needed for longer distances. Pt owns all necessary DME and will have 24/7 physical assist available from husband. Pt motivated to return home today. Recommend HHPT services to maximize functional mobility and independence. If remains admitted, will follow acutely.     Follow Up Recommendations Home health PT;Supervision for mobility/OOB    Equipment Recommendations  None recommended by PT    Recommendations for Other Services       Precautions / Restrictions Precautions Precautions: Fall Precaution Comments: L 5th toe fx w/ post-op shoe Restrictions Weight Bearing Restrictions: No       Mobility  Bed Mobility Overal bed mobility: Independent                Transfers Overall transfer level: Needs assistance Equipment used: Rolling walker (2 wheeled) Transfers: Sit to/from Stand Sit to Stand: Supervision            Ambulation/Gait Ambulation/Gait assistance: Min guard Gait Distance (Feet): 10 Feet Assistive device: Rolling walker (2 wheeled) Gait Pattern/deviations: Step-to pattern;Decreased weight shift to left;Antalgic Gait velocity: Decreased   General Gait Details: Initial marching in place with RW at EOB, pt with L knee buckling and uncontrolled descent to EOB. Once cued for increased WB through BUE and decreased stance time on LLE, pt able to take steps to recliner with RW and min guard for balance; no L knee instability noted  Stairs Stairs: (Pt declined stair training; educ on technique with use of rail + assist from husband; husband educ as well)          Wheelchair Mobility    Modified Rankin (Stroke Patients Only)       Balance Overall balance assessment: Needs assistance   Sitting balance-Leahy Scale: Fair       Standing balance-Leahy Scale: Poor Standing balance comment: Reliant on BUE support                             Pertinent Vitals/Pain Pain Assessment: Faces Faces Pain Scale: Hurts little more Pain Location: LLE (toe, ankle, anterior thigh) Pain Descriptors / Indicators: Sore Pain Intervention(s): Monitored during session;Limited activity within patient's tolerance  Home Living Family/patient expects to be discharged to:: Private residence Living Arrangements: Spouse/significant other Available Help at Discharge: Family;Available 24 hours/day Type of Home: House Home Access: Stairs to enter Entrance Stairs-Rails: Psychiatric nurse of Steps: 3 Home Layout: One level Home Equipment: Walker - 2 wheels;Cane - single point;Bedside commode;Wheelchair - manual;Grab bars -  tub/shower;Tub bench;Shower seat      Prior Function Level of Independence: Independent               Hand Dominance        Extremity/Trunk Assessment   Upper Extremity Assessment Upper Extremity Assessment: Overall WFL for tasks assessed    Lower Extremity Assessment Lower Extremity Assessment: LLE deficits/detail LLE Deficits / Details: L hip flexion 4/5, knee ext 3/5, knee flex 4/5, ankle 4/5       Communication   Communication: No difficulties  Cognition Arousal/Alertness: Awake/alert Behavior During Therapy: WFL for tasks assessed/performed Overall Cognitive Status: Within Functional Limits for tasks assessed                                        General Comments General comments (skin integrity, edema, etc.): SpO2 98% on 5L O2 [place]    Exercises     Assessment/Plan    PT Assessment Patient needs continued PT services  PT Problem List Decreased strength;Decreased activity tolerance;Decreased balance;Decreased knowledge of use of DME;Pain       PT Treatment Interventions DME instruction;Gait training;Stair training;Functional mobility training;Therapeutic activities;Therapeutic exercise;Neuromuscular re-education;Balance training;Patient/family education    PT Goals (Current goals can be found in the Care Plan section)  Acute Rehab PT Goals Patient Stated Goal: Return home today PT Goal Formulation: With patient Time For Goal Achievement: 04/12/18 Potential to Achieve Goals: Good    Frequency Min 3X/week   Barriers to discharge        Co-evaluation               AM-PAC PT "6 Clicks" Daily Activity  Outcome Measure Difficulty turning over in bed (including adjusting bedclothes, sheets and blankets)?: None Difficulty moving from lying on back to sitting on the side of the bed? : None Difficulty sitting down on and standing up from a chair with arms (e.g., wheelchair, bedside commode, etc,.)?: A Little Help needed moving to  and from a bed to chair (including a wheelchair)?: A Little Help needed walking in hospital room?: A Little Help needed climbing 3-5 steps with a railing? : A Lot 6 Click Score: 19    End of Session Equipment Utilized During Treatment: Gait belt;Oxygen Activity Tolerance: Patient tolerated treatment well Patient left: in chair;with call bell/phone within reach;with family/visitor present Nurse Communication: Mobility status PT Visit Diagnosis: Other abnormalities of gait and mobility (R26.89);Pain Pain - Right/Left: Left Pain - part of body: Leg;Ankle and joints of foot    Time: 1020-1052 PT Time Calculation (min) (ACUTE ONLY): 32 min   Charges:   PT Evaluation $PT Eval Moderate Complexity: 1 Mod PT Treatments $Gait Training: 8-22 mins   PT G Codes:       Mabeline Caras, PT, DPT Acute Rehab Services  Pager: [place] 03/29/2018, 11:08 AM

## 2018-03-31 DIAGNOSIS — Z86718 Personal history of other venous thrombosis and embolism: Secondary | ICD-10-CM | POA: Diagnosis not present

## 2018-03-31 DIAGNOSIS — Z794 Long term (current) use of insulin: Secondary | ICD-10-CM | POA: Diagnosis not present

## 2018-03-31 DIAGNOSIS — Z6838 Body mass index (BMI) 38.0-38.9, adult: Secondary | ICD-10-CM | POA: Diagnosis not present

## 2018-03-31 DIAGNOSIS — I82409 Acute embolism and thrombosis of unspecified deep veins of unspecified lower extremity: Secondary | ICD-10-CM | POA: Diagnosis not present

## 2018-03-31 DIAGNOSIS — W19XXXD Unspecified fall, subsequent encounter: Secondary | ICD-10-CM | POA: Diagnosis not present

## 2018-03-31 DIAGNOSIS — I110 Hypertensive heart disease with heart failure: Secondary | ICD-10-CM | POA: Diagnosis not present

## 2018-03-31 DIAGNOSIS — I25700 Atherosclerosis of coronary artery bypass graft(s), unspecified, with unstable angina pectoris: Secondary | ICD-10-CM | POA: Diagnosis not present

## 2018-03-31 DIAGNOSIS — I272 Pulmonary hypertension, unspecified: Secondary | ICD-10-CM | POA: Diagnosis not present

## 2018-03-31 DIAGNOSIS — I25110 Atherosclerotic heart disease of native coronary artery with unstable angina pectoris: Secondary | ICD-10-CM | POA: Diagnosis not present

## 2018-03-31 DIAGNOSIS — M84475D Pathological fracture, left foot, subsequent encounter for fracture with routine healing: Secondary | ICD-10-CM | POA: Diagnosis not present

## 2018-03-31 DIAGNOSIS — J449 Chronic obstructive pulmonary disease, unspecified: Secondary | ICD-10-CM | POA: Diagnosis not present

## 2018-03-31 DIAGNOSIS — J9611 Chronic respiratory failure with hypoxia: Secondary | ICD-10-CM | POA: Diagnosis not present

## 2018-03-31 DIAGNOSIS — E785 Hyperlipidemia, unspecified: Secondary | ICD-10-CM | POA: Diagnosis not present

## 2018-03-31 DIAGNOSIS — J441 Chronic obstructive pulmonary disease with (acute) exacerbation: Secondary | ICD-10-CM | POA: Diagnosis not present

## 2018-03-31 DIAGNOSIS — I5033 Acute on chronic diastolic (congestive) heart failure: Secondary | ICD-10-CM | POA: Diagnosis not present

## 2018-03-31 DIAGNOSIS — S92512D Displaced fracture of proximal phalanx of left lesser toe(s), subsequent encounter for fracture with routine healing: Secondary | ICD-10-CM | POA: Diagnosis not present

## 2018-03-31 DIAGNOSIS — Z7901 Long term (current) use of anticoagulants: Secondary | ICD-10-CM | POA: Diagnosis not present

## 2018-03-31 DIAGNOSIS — Z299 Encounter for prophylactic measures, unspecified: Secondary | ICD-10-CM | POA: Diagnosis not present

## 2018-03-31 DIAGNOSIS — Z951 Presence of aortocoronary bypass graft: Secondary | ICD-10-CM | POA: Diagnosis not present

## 2018-03-31 DIAGNOSIS — Z930 Tracheostomy status: Secondary | ICD-10-CM | POA: Diagnosis not present

## 2018-03-31 DIAGNOSIS — F17210 Nicotine dependence, cigarettes, uncomplicated: Secondary | ICD-10-CM | POA: Diagnosis not present

## 2018-03-31 DIAGNOSIS — I2510 Atherosclerotic heart disease of native coronary artery without angina pectoris: Secondary | ICD-10-CM | POA: Diagnosis not present

## 2018-03-31 DIAGNOSIS — D6862 Lupus anticoagulant syndrome: Secondary | ICD-10-CM | POA: Diagnosis not present

## 2018-03-31 DIAGNOSIS — Z9181 History of falling: Secondary | ICD-10-CM | POA: Diagnosis not present

## 2018-03-31 DIAGNOSIS — E1165 Type 2 diabetes mellitus with hyperglycemia: Secondary | ICD-10-CM | POA: Diagnosis not present

## 2018-03-31 DIAGNOSIS — Z9981 Dependence on supplemental oxygen: Secondary | ICD-10-CM | POA: Diagnosis not present

## 2018-03-31 DIAGNOSIS — Z6841 Body Mass Index (BMI) 40.0 and over, adult: Secondary | ICD-10-CM | POA: Diagnosis not present

## 2018-03-31 DIAGNOSIS — Z7902 Long term (current) use of antithrombotics/antiplatelets: Secondary | ICD-10-CM | POA: Diagnosis not present

## 2018-04-03 DIAGNOSIS — I110 Hypertensive heart disease with heart failure: Secondary | ICD-10-CM | POA: Diagnosis not present

## 2018-04-03 DIAGNOSIS — E1165 Type 2 diabetes mellitus with hyperglycemia: Secondary | ICD-10-CM | POA: Diagnosis not present

## 2018-04-03 DIAGNOSIS — I25700 Atherosclerosis of coronary artery bypass graft(s), unspecified, with unstable angina pectoris: Secondary | ICD-10-CM | POA: Diagnosis not present

## 2018-04-03 DIAGNOSIS — I5033 Acute on chronic diastolic (congestive) heart failure: Secondary | ICD-10-CM | POA: Diagnosis not present

## 2018-04-03 DIAGNOSIS — I25110 Atherosclerotic heart disease of native coronary artery with unstable angina pectoris: Secondary | ICD-10-CM | POA: Diagnosis not present

## 2018-04-03 DIAGNOSIS — D6862 Lupus anticoagulant syndrome: Secondary | ICD-10-CM | POA: Diagnosis not present

## 2018-04-04 DIAGNOSIS — I25110 Atherosclerotic heart disease of native coronary artery with unstable angina pectoris: Secondary | ICD-10-CM | POA: Diagnosis not present

## 2018-04-04 DIAGNOSIS — I25700 Atherosclerosis of coronary artery bypass graft(s), unspecified, with unstable angina pectoris: Secondary | ICD-10-CM | POA: Diagnosis not present

## 2018-04-04 DIAGNOSIS — I110 Hypertensive heart disease with heart failure: Secondary | ICD-10-CM | POA: Diagnosis not present

## 2018-04-04 DIAGNOSIS — I5033 Acute on chronic diastolic (congestive) heart failure: Secondary | ICD-10-CM | POA: Diagnosis not present

## 2018-04-04 DIAGNOSIS — D6862 Lupus anticoagulant syndrome: Secondary | ICD-10-CM | POA: Diagnosis not present

## 2018-04-04 DIAGNOSIS — E1165 Type 2 diabetes mellitus with hyperglycemia: Secondary | ICD-10-CM | POA: Diagnosis not present

## 2018-04-05 DIAGNOSIS — E1165 Type 2 diabetes mellitus with hyperglycemia: Secondary | ICD-10-CM | POA: Diagnosis not present

## 2018-04-05 DIAGNOSIS — I110 Hypertensive heart disease with heart failure: Secondary | ICD-10-CM | POA: Diagnosis not present

## 2018-04-05 DIAGNOSIS — I25110 Atherosclerotic heart disease of native coronary artery with unstable angina pectoris: Secondary | ICD-10-CM | POA: Diagnosis not present

## 2018-04-05 DIAGNOSIS — I25700 Atherosclerosis of coronary artery bypass graft(s), unspecified, with unstable angina pectoris: Secondary | ICD-10-CM | POA: Diagnosis not present

## 2018-04-05 DIAGNOSIS — I5033 Acute on chronic diastolic (congestive) heart failure: Secondary | ICD-10-CM | POA: Diagnosis not present

## 2018-04-05 DIAGNOSIS — D6862 Lupus anticoagulant syndrome: Secondary | ICD-10-CM | POA: Diagnosis not present

## 2018-04-06 DIAGNOSIS — I25700 Atherosclerosis of coronary artery bypass graft(s), unspecified, with unstable angina pectoris: Secondary | ICD-10-CM | POA: Diagnosis not present

## 2018-04-06 DIAGNOSIS — I110 Hypertensive heart disease with heart failure: Secondary | ICD-10-CM | POA: Diagnosis not present

## 2018-04-06 DIAGNOSIS — E1165 Type 2 diabetes mellitus with hyperglycemia: Secondary | ICD-10-CM | POA: Diagnosis not present

## 2018-04-06 DIAGNOSIS — I5033 Acute on chronic diastolic (congestive) heart failure: Secondary | ICD-10-CM | POA: Diagnosis not present

## 2018-04-06 DIAGNOSIS — D6862 Lupus anticoagulant syndrome: Secondary | ICD-10-CM | POA: Diagnosis not present

## 2018-04-06 DIAGNOSIS — I25110 Atherosclerotic heart disease of native coronary artery with unstable angina pectoris: Secondary | ICD-10-CM | POA: Diagnosis not present

## 2018-04-07 DIAGNOSIS — E1165 Type 2 diabetes mellitus with hyperglycemia: Secondary | ICD-10-CM | POA: Diagnosis not present

## 2018-04-07 DIAGNOSIS — D6862 Lupus anticoagulant syndrome: Secondary | ICD-10-CM | POA: Diagnosis not present

## 2018-04-07 DIAGNOSIS — I25110 Atherosclerotic heart disease of native coronary artery with unstable angina pectoris: Secondary | ICD-10-CM | POA: Diagnosis not present

## 2018-04-07 DIAGNOSIS — I5033 Acute on chronic diastolic (congestive) heart failure: Secondary | ICD-10-CM | POA: Diagnosis not present

## 2018-04-07 DIAGNOSIS — I25700 Atherosclerosis of coronary artery bypass graft(s), unspecified, with unstable angina pectoris: Secondary | ICD-10-CM | POA: Diagnosis not present

## 2018-04-07 DIAGNOSIS — I110 Hypertensive heart disease with heart failure: Secondary | ICD-10-CM | POA: Diagnosis not present

## 2018-04-10 DIAGNOSIS — I110 Hypertensive heart disease with heart failure: Secondary | ICD-10-CM | POA: Diagnosis not present

## 2018-04-10 DIAGNOSIS — I25700 Atherosclerosis of coronary artery bypass graft(s), unspecified, with unstable angina pectoris: Secondary | ICD-10-CM | POA: Diagnosis not present

## 2018-04-10 DIAGNOSIS — D6862 Lupus anticoagulant syndrome: Secondary | ICD-10-CM | POA: Diagnosis not present

## 2018-04-10 DIAGNOSIS — I25110 Atherosclerotic heart disease of native coronary artery with unstable angina pectoris: Secondary | ICD-10-CM | POA: Diagnosis not present

## 2018-04-10 DIAGNOSIS — I5033 Acute on chronic diastolic (congestive) heart failure: Secondary | ICD-10-CM | POA: Diagnosis not present

## 2018-04-10 DIAGNOSIS — E1165 Type 2 diabetes mellitus with hyperglycemia: Secondary | ICD-10-CM | POA: Diagnosis not present

## 2018-04-11 DIAGNOSIS — I5033 Acute on chronic diastolic (congestive) heart failure: Secondary | ICD-10-CM | POA: Diagnosis not present

## 2018-04-11 DIAGNOSIS — I110 Hypertensive heart disease with heart failure: Secondary | ICD-10-CM | POA: Diagnosis not present

## 2018-04-11 DIAGNOSIS — D6862 Lupus anticoagulant syndrome: Secondary | ICD-10-CM | POA: Diagnosis not present

## 2018-04-11 DIAGNOSIS — E1165 Type 2 diabetes mellitus with hyperglycemia: Secondary | ICD-10-CM | POA: Diagnosis not present

## 2018-04-11 DIAGNOSIS — I25700 Atherosclerosis of coronary artery bypass graft(s), unspecified, with unstable angina pectoris: Secondary | ICD-10-CM | POA: Diagnosis not present

## 2018-04-11 DIAGNOSIS — I25110 Atherosclerotic heart disease of native coronary artery with unstable angina pectoris: Secondary | ICD-10-CM | POA: Diagnosis not present

## 2018-04-12 DIAGNOSIS — I5033 Acute on chronic diastolic (congestive) heart failure: Secondary | ICD-10-CM | POA: Diagnosis not present

## 2018-04-12 DIAGNOSIS — D6862 Lupus anticoagulant syndrome: Secondary | ICD-10-CM | POA: Diagnosis not present

## 2018-04-12 DIAGNOSIS — I25110 Atherosclerotic heart disease of native coronary artery with unstable angina pectoris: Secondary | ICD-10-CM | POA: Diagnosis not present

## 2018-04-12 DIAGNOSIS — E1165 Type 2 diabetes mellitus with hyperglycemia: Secondary | ICD-10-CM | POA: Diagnosis not present

## 2018-04-12 DIAGNOSIS — I25700 Atherosclerosis of coronary artery bypass graft(s), unspecified, with unstable angina pectoris: Secondary | ICD-10-CM | POA: Diagnosis not present

## 2018-04-12 DIAGNOSIS — I110 Hypertensive heart disease with heart failure: Secondary | ICD-10-CM | POA: Diagnosis not present

## 2018-04-13 DIAGNOSIS — I25700 Atherosclerosis of coronary artery bypass graft(s), unspecified, with unstable angina pectoris: Secondary | ICD-10-CM | POA: Diagnosis not present

## 2018-04-13 DIAGNOSIS — I5033 Acute on chronic diastolic (congestive) heart failure: Secondary | ICD-10-CM | POA: Diagnosis not present

## 2018-04-13 DIAGNOSIS — E1165 Type 2 diabetes mellitus with hyperglycemia: Secondary | ICD-10-CM | POA: Diagnosis not present

## 2018-04-13 DIAGNOSIS — I110 Hypertensive heart disease with heart failure: Secondary | ICD-10-CM | POA: Diagnosis not present

## 2018-04-13 DIAGNOSIS — D6862 Lupus anticoagulant syndrome: Secondary | ICD-10-CM | POA: Diagnosis not present

## 2018-04-13 DIAGNOSIS — I25110 Atherosclerotic heart disease of native coronary artery with unstable angina pectoris: Secondary | ICD-10-CM | POA: Diagnosis not present

## 2018-04-14 DIAGNOSIS — I110 Hypertensive heart disease with heart failure: Secondary | ICD-10-CM | POA: Diagnosis not present

## 2018-04-14 DIAGNOSIS — I25110 Atherosclerotic heart disease of native coronary artery with unstable angina pectoris: Secondary | ICD-10-CM | POA: Diagnosis not present

## 2018-04-14 DIAGNOSIS — I80209 Phlebitis and thrombophlebitis of unspecified deep vessels of unspecified lower extremity: Secondary | ICD-10-CM | POA: Diagnosis not present

## 2018-04-14 DIAGNOSIS — I5033 Acute on chronic diastolic (congestive) heart failure: Secondary | ICD-10-CM | POA: Diagnosis not present

## 2018-04-14 DIAGNOSIS — D6862 Lupus anticoagulant syndrome: Secondary | ICD-10-CM | POA: Diagnosis not present

## 2018-04-14 DIAGNOSIS — E1142 Type 2 diabetes mellitus with diabetic polyneuropathy: Secondary | ICD-10-CM | POA: Diagnosis not present

## 2018-04-14 DIAGNOSIS — I25700 Atherosclerosis of coronary artery bypass graft(s), unspecified, with unstable angina pectoris: Secondary | ICD-10-CM | POA: Diagnosis not present

## 2018-04-14 DIAGNOSIS — I10 Essential (primary) hypertension: Secondary | ICD-10-CM | POA: Diagnosis not present

## 2018-04-14 DIAGNOSIS — E1165 Type 2 diabetes mellitus with hyperglycemia: Secondary | ICD-10-CM | POA: Diagnosis not present

## 2018-04-14 DIAGNOSIS — Z299 Encounter for prophylactic measures, unspecified: Secondary | ICD-10-CM | POA: Diagnosis not present

## 2018-04-14 DIAGNOSIS — M25552 Pain in left hip: Secondary | ICD-10-CM | POA: Diagnosis not present

## 2018-04-14 DIAGNOSIS — Z6841 Body Mass Index (BMI) 40.0 and over, adult: Secondary | ICD-10-CM | POA: Diagnosis not present

## 2018-04-17 DIAGNOSIS — I25110 Atherosclerotic heart disease of native coronary artery with unstable angina pectoris: Secondary | ICD-10-CM | POA: Diagnosis not present

## 2018-04-17 DIAGNOSIS — E1165 Type 2 diabetes mellitus with hyperglycemia: Secondary | ICD-10-CM | POA: Diagnosis not present

## 2018-04-17 DIAGNOSIS — I110 Hypertensive heart disease with heart failure: Secondary | ICD-10-CM | POA: Diagnosis not present

## 2018-04-17 DIAGNOSIS — I25700 Atherosclerosis of coronary artery bypass graft(s), unspecified, with unstable angina pectoris: Secondary | ICD-10-CM | POA: Diagnosis not present

## 2018-04-17 DIAGNOSIS — I5033 Acute on chronic diastolic (congestive) heart failure: Secondary | ICD-10-CM | POA: Diagnosis not present

## 2018-04-17 DIAGNOSIS — D6862 Lupus anticoagulant syndrome: Secondary | ICD-10-CM | POA: Diagnosis not present

## 2018-04-18 DIAGNOSIS — D6862 Lupus anticoagulant syndrome: Secondary | ICD-10-CM | POA: Diagnosis not present

## 2018-04-18 DIAGNOSIS — E1165 Type 2 diabetes mellitus with hyperglycemia: Secondary | ICD-10-CM | POA: Diagnosis not present

## 2018-04-18 DIAGNOSIS — I110 Hypertensive heart disease with heart failure: Secondary | ICD-10-CM | POA: Diagnosis not present

## 2018-04-18 DIAGNOSIS — I25700 Atherosclerosis of coronary artery bypass graft(s), unspecified, with unstable angina pectoris: Secondary | ICD-10-CM | POA: Diagnosis not present

## 2018-04-18 DIAGNOSIS — I5033 Acute on chronic diastolic (congestive) heart failure: Secondary | ICD-10-CM | POA: Diagnosis not present

## 2018-04-18 DIAGNOSIS — I25110 Atherosclerotic heart disease of native coronary artery with unstable angina pectoris: Secondary | ICD-10-CM | POA: Diagnosis not present

## 2018-04-18 NOTE — Progress Notes (Signed)
Cardiology Office Note   Date:  04/20/2018   ID:  Theresa [name], DOB Nov 27, 1965, MRN [id number]  PCP:  Monico Blitz, MD  Cardiologist:   [name], MD    Chief Complaint  Patient presents with  . Chest Pain      History of Present Illness: Theresa [name] is a 52 y.o. female who presents for follow up of diastolic HF.  She has a history of two-vessel CABG with stenting prior to her CABG performed in [place] with CABG performed in [place] ([place] at [facility] in 2007.  She was hospitalized in May 2017 for acute on chronic hypoxic respiratory failure multifactorial in etiology due to acute on chronic diastolic heart failure, pneumonia, and COPD exacerbation.   She was hospitalized for an acute inferior STEMI in February 2017 and underwent drug-eluting stent placement in the distal SVG to the PDA.  She also has a history of diabetes, hypertension, hyperlipidemia, lupus anticoagulant with DVT and is on chronic anticoagulant therapy.   She presented in July with chest pain.   She had a cath with results below.   She was managed medically.  She had a mean pulmonary pressure of 40.  She had acute on chronic HF with an EF of 50%.  She was diuresed about 27 lbs with a discharge weight of 173 lbs.  I reviewed hospital records for this visit.    Since going home she has not been weighing herself every morning but she does think that her weights at home when she got home were around 189 pounds on her scale.  She thinks she is kept her weight down and that her breathing is much better than when she went into the hospital.  She is not describing PND or orthopnea.  She still unfortunately smokes cigarettes.  She is not having any new chest pressure, neck or arm discomfort.  Is not having any new palpitations, presyncope or syncope.  She has no new edema.  She does have some chronic exertional angina.   Past Medical History:  Diagnosis Date  . CAD (coronary artery disease)    a.  s/p prior PCI. b. CABG 2007 at [facility] in [place] 2007. c. inferior STEMI 10/2015 s/p DES to dSVG-PDA.  Marland Kitchen Cervical cancer ([place])   . Chronic diastolic CHF (congestive heart failure) ([place])   . Chronic respiratory failure ([place])    s/p tracheostomy 2002  . Chronic RUQ pain   . COPD (chronic obstructive pulmonary disease) ([place])   . Diabetes mellitus ([place])   . DVT (deep venous thrombosis) ([place])   . Endometriosis   . History of gallstones 01/2016   seen on Ultrasound  . History of HIDA scan 11/2016   normal  . HTN (hypertension)   . Hyperlipidemia   . Lupus anticoagulant disorder (H[facility])    on coumadin  . Morbid obesity ([place])   . ST elevation (STEMI) myocardial infarction involving right coronary artery ([place]) 10/29/15   stent to VG to PDA  . Toe fracture, right 03/29/2018  . Tracheostomy in place [facility]), chronic since 2002 11/03/2015    Past Surgical History:  Procedure Laterality Date  . CARDIAC CATHETERIZATION N/A 10/29/2015   Procedure: Left Heart Cath and Cors/Grafts Angiography;  Surgeon: Burnell Blanks, MD;  Location: [place] [facility];  Service: Cardiovascular;  Laterality: N/A;  . CARDIAC CATHETERIZATION  10/29/2015   Procedure: Coronary Stent Intervention;  Surgeon: Burnell Blanks, MD;  Location: [place]  [facility];  Service: Cardiovascular;;  . CAROTID STENT    . CESAREAN SECTION WITH BILATERAL TUBAL LIGATION    . CORONARY ARTERY BYPASS GRAFT  2007   2V  . RIGHT/LEFT HEART CATH AND CORONARY/GRAFT ANGIOGRAPHY N/A 03/24/2018   Procedure: RIGHT/LEFT HEART CATH AND CORONARY/GRAFT ANGIOGRAPHY;  Surgeon: Belva Crome, MD;  Location: [place] [facility];  Service: Cardiovascular;  Laterality: N/A;  . TRACHEOSTOMY       Current Outpatient Medications  Medication Sig Dispense Refill  . albuterol (PROVENTIL HFA;VENTOLIN HFA) 108 (90 Base) MCG/ACT inhaler Inhale 1-2 puffs into the lungs every 6 (six) hours as needed for wheezing or shortness of breath.    Marland Kitchen  albuterol (PROVENTIL) (2.5 MG/3ML) 0.083% nebulizer solution Take 3 mLs (2.5 mg total) by nebulization every 4 (four) hours as needed for wheezing or shortness of breath. 75 mL 12  . clopidogrel (PLAVIX) 75 MG tablet TAKE 1 TABLET BY MOUTH ONCE DAILY **NEED  OFFICE  VISIT** 90 tablet 3  . furosemide (LASIX) 40 MG tablet Take 1 tablet (40 mg total) by mouth 2 (two) times daily. 30 tablet 0  . gabapentin (NEURONTIN) 300 MG capsule Take 300-600 mg by mouth 2 (two) times daily.    . insulin degludec (TRESIBA FLEXTOUCH) 100 UNIT/ML SOPN FlexTouch Pen Inject 25 Units into the skin daily at 10 pm.    . lisinopril (PRINIVIL,ZESTRIL) 20 MG tablet Take 1 tablet (20 mg total) by mouth 2 (two) times daily. 180 tablet 3  . metoprolol (LOPRESSOR) 50 MG tablet Take 50 mg by mouth 2 (two) times daily.    . nitroGLYCERIN (NITROSTAT) 0.4 MG SL tablet Place 0.4 mg under the tongue every 5 (five) minutes as needed for chest pain.    . pantoprazole (PROTONIX) 40 MG tablet Take 40 mg by mouth daily.    . rosuvastatin (CRESTOR) 10 MG tablet Take 1 tablet (10 mg total) by mouth daily. 30 tablet 6  . warfarin (COUMADIN) 5 MG tablet Take 2.5-5 mg by mouth See admin instructions. 5mg  on Monday and Friday; 2.5 mg rest of the days     No current facility-administered medications for this visit.     Allergies:   Penicillins; Ciprofloxacin; and Ibuprofen    ROS:  Please see the history of present illness.   Otherwise, review of systems are positive for none.   All other systems are reviewed and negative.    PHYSICAL EXAM: VS:  BP 128/84   Pulse 66   Ht 4\' 11"  (1.499 m)   Wt 193 lb 6.4 oz (87.7 kg)   BMI 39.06 kg/m  , BMI Body mass index is 39.06 kg/m. GENERAL:  Well appearing NECK:  No jugular venous distention, waveform within normal limits, carotid upstroke brisk and symmetric, no bruits, no thyromegaly, tracheostomy in place LUNGS:  Clear to auscultation bilaterally CHEST:  Well healed sternotomy scar. HEART:   PMI not displaced or sustained,S1 and S2 within normal limits, no S3, no S4, no clicks, no rubs, no murmurs ABD:  Flat, positive bowel sounds normal in frequency in pitch, no bruits, no rebound, no guarding, no midline pulsatile mass, no hepatomegaly, no splenomegaly EXT:  2 plus pulses throughout, no edema, no cyanosis no clubbing  EKG:  EKG is not ordered today.   Cardiac Cath 03/24/18  Severe native coronary artery disease with total occlusion of the proximal LAD, total occlusion of the proximal RCA, and patent circumflex with patent proximal stent with eccentric 50% proximal narrowing. Distal obtuse  marginal branches are severely and diffusely diseased.  Bypass graft failure with total occlusion of SVG to RCA.  Patent LIMA to LAD.  Right coronary territory is supplied by collaterals from LAD and circumflex.  Left ventricular systolic dysfunction with EF 35 to 45%. Mid to distal inferior wall akinesis. Inferobasal and inferoapical wall severely hypokinetic. Left ventricular end-diastolic pressure elevated at 19 mmHg  Moderate to severe pulmonary hypertension  Recent Labs: 08/01/2017: TSH 0.651 08/02/2017: ALT 13 08/03/2017: Magnesium 2.3 03/22/2018: B Natriuretic Peptide 1,049.0 03/29/2018: BUN 14; Creatinine, Ser 1.06; Hemoglobin 12.8; Platelets 203; Potassium 4.0; Sodium 140    Lipid Panel    Component Value Date/Time   CHOL 167 03/24/2018 0451   TRIG 101 03/24/2018 0451   HDL 44 03/24/2018 0451   CHOLHDL 3.8 03/24/2018 0451   VLDL 20 03/24/2018 0451   LDLCALC 103 (H) 03/24/2018 0451      Wt Readings from Last 3 Encounters:  04/20/18 193 lb 6.4 oz (87.7 kg)  03/29/18 173 lb 1 oz (78.5 kg)  03/22/18 200 lb (90.7 kg)      Other studies Reviewed: Additional studies/ records that were reviewed today include: Hospital records. . Review of the above records demonstrates:  Please see elsewhere in the note.     ASSESSMENT AND PLAN:  CAD:    We had a long  discussion and I pulled out a model and the cath report drawings and went over her anatomy so that she has a better understanding of this.  She needs to stop her aspirin but remain on Plavix and her anticoagulant.   Essential HTN:     This is being managed in the context of treating his CHFyle changes (TLC).  ACUTE ON CHRONIC CHF:    She seems to be euvolemic.  She would not be able to afford Entresto.  I am going to increase her lisinopril 20 mg twice daily and get a basic metabolic profile in 1 week.  Hyperlipidemia:    This is followed by Monico Blitz, MD.  The LDL needs to be less than 70.  I will order a lipid when she comes back for labs.   Lupus anticoagulant with DVT:    She continues on warfarin.   Tobacco abuse:   She is trying.  I gave her instructions to call [phone]    Current medicines are reviewed at length with the patient today.  The patient does not have concerns regarding medicines.  The following changes have been made:  As above Labs/ tests ordered today include: As above.   Orders Placed This Encounter  Procedures  . Basic Metabolic Panel (BMET)  . Lipid panel     Disposition:   FU with APP in 1 month.   Signed, [name], MD  04/20/2018 10:31 AM    [facility]

## 2018-04-19 DIAGNOSIS — I5033 Acute on chronic diastolic (congestive) heart failure: Secondary | ICD-10-CM | POA: Diagnosis not present

## 2018-04-19 DIAGNOSIS — I110 Hypertensive heart disease with heart failure: Secondary | ICD-10-CM | POA: Diagnosis not present

## 2018-04-19 DIAGNOSIS — D6862 Lupus anticoagulant syndrome: Secondary | ICD-10-CM | POA: Diagnosis not present

## 2018-04-19 DIAGNOSIS — E1165 Type 2 diabetes mellitus with hyperglycemia: Secondary | ICD-10-CM | POA: Diagnosis not present

## 2018-04-19 DIAGNOSIS — I25700 Atherosclerosis of coronary artery bypass graft(s), unspecified, with unstable angina pectoris: Secondary | ICD-10-CM | POA: Diagnosis not present

## 2018-04-19 DIAGNOSIS — I25110 Atherosclerotic heart disease of native coronary artery with unstable angina pectoris: Secondary | ICD-10-CM | POA: Diagnosis not present

## 2018-04-20 ENCOUNTER — Encounter: Payer: Self-pay | Admitting: Cardiology

## 2018-04-20 ENCOUNTER — Ambulatory Visit (INDEPENDENT_AMBULATORY_CARE_PROVIDER_SITE_OTHER): Payer: Medicare Other | Admitting: Cardiology

## 2018-04-20 VITALS — BP 128/84 | HR 66 | Ht 59.0 in | Wt 193.4 lb

## 2018-04-20 DIAGNOSIS — I25708 Atherosclerosis of coronary artery bypass graft(s), unspecified, with other forms of angina pectoris: Secondary | ICD-10-CM

## 2018-04-20 DIAGNOSIS — Z79899 Other long term (current) drug therapy: Secondary | ICD-10-CM | POA: Diagnosis not present

## 2018-04-20 DIAGNOSIS — E785 Hyperlipidemia, unspecified: Secondary | ICD-10-CM

## 2018-04-20 DIAGNOSIS — I200 Unstable angina: Secondary | ICD-10-CM | POA: Diagnosis not present

## 2018-04-20 DIAGNOSIS — I5023 Acute on chronic systolic (congestive) heart failure: Secondary | ICD-10-CM | POA: Diagnosis not present

## 2018-04-20 DIAGNOSIS — Z720 Tobacco use: Secondary | ICD-10-CM

## 2018-04-20 DIAGNOSIS — Z01818 Encounter for other preprocedural examination: Secondary | ICD-10-CM | POA: Insufficient documentation

## 2018-04-20 MED ORDER — LISINOPRIL 20 MG PO TABS: 20.0000 mg | ORAL_TABLET | Freq: Two times a day (BID) | ORAL | 3 refills | Status: DC

## 2018-04-20 NOTE — Patient Instructions (Addendum)
Medication Instructions:  INCREASE- Lisinopril 20 mg twice a day STOP- Aspirin  If you need a refill on your cardiac medications before your next appointment, please call your pharmacy.  Labwork: BMP and Lipids 1 Week HERE IN OUR OFFICE AT [facility]  Take the provided lab slips with you to the lab for your blood draw.   You will NOT need to fast   Testing/Procedures: None Ordered  Follow-Up: Your physician wants you to follow-up in: 1 Month with Melvyn Neth.     Thank you for choosing [facility]!!       Heart-Healthy Eating Plan Heart-healthy meal planning includes:  Limiting unhealthy fats.  Increasing healthy fats.  Making other small dietary changes.  You may need to talk with your doctor or a diet specialist (dietitian) to create an eating plan that is right for you. What types of fat should I choose?  Choose healthy fats. These include olive oil and canola oil, flaxseeds, walnuts, almonds, and seeds.  Eat more omega-3 fats. These include salmon, mackerel, sardines, tuna, flaxseed oil, and ground flaxseeds. Try to eat fish at least twice each week.  Limit saturated fats. ? Saturated fats are often found in animal products, such as meats, butter, and cream. ? Plant sources of saturated fats include palm oil, palm kernel oil, and coconut oil.  Avoid foods with partially hydrogenated oils in them. These include stick margarine, some tub margarines, cookies, crackers, and other baked goods. These contain trans fats. What general guidelines do I need to follow?  Check food labels carefully. Identify foods with trans fats or high amounts of saturated fat.  Fill one half of your plate with vegetables and green salads. Eat 4-5 servings of vegetables per day. A serving of vegetables is: ? 1 cup of raw leafy vegetables. ?  cup of raw or cooked cut-up vegetables. ?  cup of vegetable juice.  Fill one fourth of your plate with whole grains. Look  for the word "whole" as the first word in the ingredient list.  Fill one fourth of your plate with lean protein foods.  Eat 4-5 servings of fruit per day. A serving of fruit is: ? One medium whole fruit. ?  cup of dried fruit. ?  cup of fresh, frozen, or canned fruit. ?  cup of 100% fruit juice.  Eat more foods that contain soluble fiber. These include apples, broccoli, carrots, beans, peas, and barley. Try to get 20-30 g of fiber per day.  Eat more home-cooked food. Eat less restaurant, buffet, and fast food.  Limit or avoid alcohol.  Limit foods high in starch and sugar.  Avoid fried foods.  Avoid frying your food. Try baking, boiling, grilling, or broiling it instead. You can also reduce fat by: ? Removing the skin from poultry. ? Removing all visible fats from meats. ? Skimming the fat off of stews, soups, and gravies before serving them. ? Steaming vegetables in water or broth.  Lose weight if you are overweight.  Eat 4-5 servings of nuts, legumes, and seeds per week: ? One serving of dried beans or legumes equals  cup after being cooked. ? One serving of nuts equals 1 ounces. ? One serving of seeds equals  ounce or one tablespoon.  You may need to keep track of how much salt or sodium you eat. This is especially true if you have high blood pressure. Talk with your doctor or dietitian to get more information. What foods can I eat? Grains  Breads, including [place], white, pita, wheat, raisin, rye, oatmeal, and [place]. Tortillas that are neither fried nor made with lard or trans fat. Low-fat rolls, including hotdog and hamburger buns and English muffins. Biscuits. Muffins. Waffles. Pancakes. Light popcorn. Whole-grain cereals. Flatbread. Melba toast. Pretzels. Breadsticks. Rusks. Low-fat snacks. Low-fat crackers, including oyster, saltine, matzo, graham, animal, and rye. Rice and pasta, including brown rice and pastas that are made with whole wheat. Vegetables All  vegetables. Fruits All fruits, but limit coconut. Meats and Other Protein Sources Lean, well-trimmed beef, veal, pork, and lamb. Chicken and [place] without skin. All fish and shellfish. Wild duck, rabbit, pheasant, and venison. Egg whites or low-cholesterol egg substitutes. Dried beans, peas, lentils, and tofu. Seeds and most nuts. Dairy Low-fat or nonfat cheeses, including ricotta, string, and mozzarella. Skim or 1% milk that is liquid, powdered, or evaporated. Buttermilk that is made with low-fat milk. Nonfat or low-fat yogurt. Beverages Mineral water. Diet carbonated beverages. Sweets and Desserts Sherbets and fruit ices. Honey, jam, marmalade, jelly, and syrups. Meringues and gelatins. Pure sugar candy, such as hard candy, jelly beans, gumdrops, mints, marshmallows, and small amounts of dark chocolate. [employer] Eat all sweets and desserts in moderation. Fats and Oils Nonhydrogenated (trans-free) margarines. Vegetable oils, including soybean, sesame, sunflower, olive, peanut, safflower, corn, canola, and cottonseed. Salad dressings or mayonnaise made with a vegetable oil. Limit added fats and oils that you use for cooking, baking, salads, and as spreads. Other Cocoa powder. Coffee and tea. All seasonings and condiments. The items listed above may not be a complete list of recommended foods or beverages. Contact your dietitian for more options. What foods are not recommended? Grains Breads that are made with saturated or trans fats, oils, or whole milk. Croissants. Butter rolls. Cheese breads. Sweet rolls. Donuts. Buttered popcorn. Chow mein noodles. High-fat crackers, such as cheese or butter crackers. Meats and Other Protein Sources Fatty meats, such as hotdogs, short ribs, sausage, spareribs, bacon, rib eye roast or steak, and mutton. High-fat deli meats, such as salami and bologna. Caviar. Domestic duck and goose. Organ meats, such as kidney, liver, sweetbreads, and  heart. Dairy Cream, sour cream, cream cheese, and creamed cottage cheese. Whole-milk cheeses, including blue (bleu), Monte[place] Jack, Stella, [place], American, [place], Swiss, cheddar, [place], and [place]. Whole or 2% milk that is liquid, evaporated, or condensed. Whole buttermilk. Cream sauce or high-fat cheese sauce. Yogurt that is made from whole milk. Beverages Regular sodas and juice drinks with added sugar. Sweets and Desserts Frosting. Pudding. Cookies. Cakes other than angel food cake. Candy that has milk chocolate or white chocolate, hydrogenated fat, butter, coconut, or unknown ingredients. Buttered syrups. Full-fat ice cream or ice cream drinks. Fats and Oils Gravy that has suet, meat fat, or shortening. Cocoa butter, hydrogenated oils, palm oil, coconut oil, palm kernel oil. These can often be found in baked products, candy, fried foods, nondairy creamers, and whipped toppings. Solid fats and shortenings, including bacon fat, salt pork, lard, and butter. Nondairy cream substitutes, such as coffee creamers and sour cream substitutes. Salad dressings that are made of unknown oils, cheese, or sour cream. The items listed above may not be a complete list of foods and beverages to avoid. Contact your dietitian for more information. This information is not intended to replace advice given to you by your health care provider. Make sure you discuss any questions you have with your health care provider. Document Released: 02/22/2012 Document Revised: 01/29/2016 Document Reviewed: 02/14/2014 Elsevier Interactive Patient Education  2018 [employer]  

## 2018-04-21 ENCOUNTER — Inpatient Hospital Stay (INDEPENDENT_AMBULATORY_CARE_PROVIDER_SITE_OTHER): Payer: Self-pay | Admitting: Orthopedic Surgery

## 2018-04-24 DIAGNOSIS — D6862 Lupus anticoagulant syndrome: Secondary | ICD-10-CM | POA: Diagnosis not present

## 2018-04-24 DIAGNOSIS — I110 Hypertensive heart disease with heart failure: Secondary | ICD-10-CM | POA: Diagnosis not present

## 2018-04-24 DIAGNOSIS — I25110 Atherosclerotic heart disease of native coronary artery with unstable angina pectoris: Secondary | ICD-10-CM | POA: Diagnosis not present

## 2018-04-24 DIAGNOSIS — I5033 Acute on chronic diastolic (congestive) heart failure: Secondary | ICD-10-CM | POA: Diagnosis not present

## 2018-04-24 DIAGNOSIS — I25700 Atherosclerosis of coronary artery bypass graft(s), unspecified, with unstable angina pectoris: Secondary | ICD-10-CM | POA: Diagnosis not present

## 2018-04-24 DIAGNOSIS — E1165 Type 2 diabetes mellitus with hyperglycemia: Secondary | ICD-10-CM | POA: Diagnosis not present

## 2018-04-27 DIAGNOSIS — I5033 Acute on chronic diastolic (congestive) heart failure: Secondary | ICD-10-CM | POA: Diagnosis not present

## 2018-04-27 DIAGNOSIS — E1165 Type 2 diabetes mellitus with hyperglycemia: Secondary | ICD-10-CM | POA: Diagnosis not present

## 2018-04-27 DIAGNOSIS — I25110 Atherosclerotic heart disease of native coronary artery with unstable angina pectoris: Secondary | ICD-10-CM | POA: Diagnosis not present

## 2018-04-27 DIAGNOSIS — D6862 Lupus anticoagulant syndrome: Secondary | ICD-10-CM | POA: Diagnosis not present

## 2018-04-27 DIAGNOSIS — I110 Hypertensive heart disease with heart failure: Secondary | ICD-10-CM | POA: Diagnosis not present

## 2018-04-27 DIAGNOSIS — I25700 Atherosclerosis of coronary artery bypass graft(s), unspecified, with unstable angina pectoris: Secondary | ICD-10-CM | POA: Diagnosis not present

## 2018-05-03 DIAGNOSIS — I110 Hypertensive heart disease with heart failure: Secondary | ICD-10-CM | POA: Diagnosis not present

## 2018-05-03 DIAGNOSIS — I5033 Acute on chronic diastolic (congestive) heart failure: Secondary | ICD-10-CM | POA: Diagnosis not present

## 2018-05-03 DIAGNOSIS — E1165 Type 2 diabetes mellitus with hyperglycemia: Secondary | ICD-10-CM | POA: Diagnosis not present

## 2018-05-03 DIAGNOSIS — D6862 Lupus anticoagulant syndrome: Secondary | ICD-10-CM | POA: Diagnosis not present

## 2018-05-03 DIAGNOSIS — I25700 Atherosclerosis of coronary artery bypass graft(s), unspecified, with unstable angina pectoris: Secondary | ICD-10-CM | POA: Diagnosis not present

## 2018-05-03 DIAGNOSIS — I25110 Atherosclerotic heart disease of native coronary artery with unstable angina pectoris: Secondary | ICD-10-CM | POA: Diagnosis not present

## 2018-05-04 DIAGNOSIS — E119 Type 2 diabetes mellitus without complications: Secondary | ICD-10-CM | POA: Diagnosis not present

## 2018-05-04 DIAGNOSIS — I2510 Atherosclerotic heart disease of native coronary artery without angina pectoris: Secondary | ICD-10-CM | POA: Diagnosis not present

## 2018-05-04 DIAGNOSIS — M159 Polyosteoarthritis, unspecified: Secondary | ICD-10-CM | POA: Diagnosis not present

## 2018-05-04 DIAGNOSIS — I10 Essential (primary) hypertension: Secondary | ICD-10-CM | POA: Diagnosis not present

## 2018-05-19 DIAGNOSIS — I110 Hypertensive heart disease with heart failure: Secondary | ICD-10-CM | POA: Diagnosis not present

## 2018-05-19 DIAGNOSIS — E1165 Type 2 diabetes mellitus with hyperglycemia: Secondary | ICD-10-CM | POA: Diagnosis not present

## 2018-05-19 DIAGNOSIS — I25110 Atherosclerotic heart disease of native coronary artery with unstable angina pectoris: Secondary | ICD-10-CM | POA: Diagnosis not present

## 2018-05-19 DIAGNOSIS — I5033 Acute on chronic diastolic (congestive) heart failure: Secondary | ICD-10-CM | POA: Diagnosis not present

## 2018-05-19 DIAGNOSIS — D6862 Lupus anticoagulant syndrome: Secondary | ICD-10-CM | POA: Diagnosis not present

## 2018-05-19 DIAGNOSIS — I25700 Atherosclerosis of coronary artery bypass graft(s), unspecified, with unstable angina pectoris: Secondary | ICD-10-CM | POA: Diagnosis not present

## 2018-05-25 ENCOUNTER — Ambulatory Visit (INDEPENDENT_AMBULATORY_CARE_PROVIDER_SITE_OTHER): Payer: Medicare Other | Admitting: Student

## 2018-05-25 ENCOUNTER — Encounter: Payer: Self-pay | Admitting: Student

## 2018-05-25 VITALS — BP 124/80 | HR 68 | Ht <= 58 in | Wt 195.2 lb

## 2018-05-25 DIAGNOSIS — I10 Essential (primary) hypertension: Secondary | ICD-10-CM | POA: Diagnosis not present

## 2018-05-25 DIAGNOSIS — D6862 Lupus anticoagulant syndrome: Secondary | ICD-10-CM | POA: Diagnosis not present

## 2018-05-25 DIAGNOSIS — I5032 Chronic diastolic (congestive) heart failure: Secondary | ICD-10-CM

## 2018-05-25 DIAGNOSIS — L409 Psoriasis, unspecified: Secondary | ICD-10-CM | POA: Diagnosis not present

## 2018-05-25 DIAGNOSIS — I25708 Atherosclerosis of coronary artery bypass graft(s), unspecified, with other forms of angina pectoris: Secondary | ICD-10-CM | POA: Diagnosis not present

## 2018-05-25 DIAGNOSIS — I200 Unstable angina: Secondary | ICD-10-CM

## 2018-05-25 DIAGNOSIS — E785 Hyperlipidemia, unspecified: Secondary | ICD-10-CM | POA: Diagnosis not present

## 2018-05-25 DIAGNOSIS — Z720 Tobacco use: Secondary | ICD-10-CM

## 2018-05-25 MED ORDER — ISOSORBIDE MONONITRATE ER 30 MG PO TB24: 30.0000 mg | ORAL_TABLET | Freq: Every day | ORAL | 3 refills | Status: DC

## 2018-05-25 NOTE — Patient Instructions (Signed)
Medication Instructions:  Start imdur 30 mg daily  Labwork: none  Testing/Procedures: none  Follow-Up: Your physician recommends that you schedule a follow-up appointment in: 2-3 months    Any Other Special Instructions Will Be Listed Below (If Applicable).  You have been referred to dermatology   I will contact advanced home care concerning portable air concentrator     If you need a refill on your cardiac medications before your next appointment, please call your pharmacy.

## 2018-05-25 NOTE — Progress Notes (Signed)
Cardiology Office Note    Date:  05/25/2018   ID:  Theresa [name], DOB [birth year], MRN [id number]  PCP:  Monico Blitz, MD  Cardiologist: [name], MD    Chief Complaint  Patient presents with  . Follow-up    1 month visit    History of Present Illness:    Theresa [name] is a 52 y.o. female with past medical history of CAD (s/p CABG in 2007, DES to SVG-PDA in 10/2015), tracheostomy (placed in 2002), chronic diastolic CHF, HTN, HLD, COPD, continued tobacco use, and prior DVT (known lupus anticoagulant) who presents to the office today for one-month follow-up.  She was recently admitted to [facility] in 03/2018 for evaluation of recurrent chest pain.  She underwent a cardiac catheterization during admission which showed total occlusion of the SVG to RCA with patent LIMA to LAD.  The RCA territory was supplied by collaterals from the LAD and LCx, therefore continued medical management was recommended. She underwent aggressive diuresis during admission having lost 27 pounds during that timeframe and her discharge weight was 173 lbs. At the time of her hospital follow-up visit with Dr. Percival Spanish on 04/20/2018, she denied any recent chest pain and reported her respiratory status is back to baseline. She had noticed weight on her home scales was 189 lbs upon hospital discharge and weight was 193 lbs on the office scales that day. She was euvolemic by examination and continued on Lasix 40 mg twice daily. Lisinopril was further titrated to 20 mg twice daily with plans for repeat BMET in 1 week. She was instructed to remain on Plavix along with Coumadin for anticoagulation.  In talking with the patient and her husband today, she reports having baseline dyspnea on exertion and typically uses 3 to 4 L nasal cannula.  Denies any acute changes in this. She does report occasional episodes of chest pain since her last office visit which mostly occurs when walking around her home and resolves within 10 to  15 minutes. She has not utilized sublingual nitroglycerin due to symptoms resolving with rest. Denies any specific orthopnea, PND, or lower extremity edema.  Reports that weight has been variable on her home scales between 192 to 197 lbs. She was previously prescribed Lasix 40 mg twice daily but has been taking this mostly as 40 mg daily and not taking the medication at all on some days due to frequent urination. They do consume fast food multiple times per week reporting this is more affordable than cooking at home.  Past Medical History:  Diagnosis Date  . CAD (coronary artery disease)    a. s/p prior PCI. b. CABG 2007 at [facility] in [place] 2007. c. inferior STEMI 10/2015 s/p DES to dSVG-PDA.  Marland Kitchen Cervical cancer ([place])   . Chronic diastolic CHF (congestive heart failure) ([place])   . Chronic respiratory failure ([place])    s/p tracheostomy 2002  . Chronic RUQ pain   . COPD (chronic obstructive pulmonary disease) ([place])   . Diabetes mellitus ([employer])   . DVT (deep venous thrombosis) ([place])   . Endometriosis   . History of gallstones 01/2016   seen on Ultrasound  . History of HIDA scan 11/2016   normal  . HTN (hypertension)   . Hyperlipidemia   . Lupus anticoagulant disorder ([facility])    on coumadin  . Morbid obesity ([place])   . ST elevation (STEMI) myocardial infarction involving right coronary artery ([place]) 10/29/15   stent to VG to PDA  .  Toe fracture, right 03/29/2018  . Tracheostomy in place [facility]), chronic since 2002 11/03/2015    Past Surgical History:  Procedure Laterality Date  . CARDIAC CATHETERIZATION N/A 10/29/2015   Procedure: Left Heart Cath and Cors/Grafts Angiography;  Surgeon: Burnell Blanks, MD;  Location: [place] [facility];  Service: Cardiovascular;  Laterality: N/A;  . CARDIAC CATHETERIZATION  10/29/2015   Procedure: Coronary Stent Intervention;  Surgeon: Burnell Blanks, MD;  Location: [place] [facility];  Service: Cardiovascular;;  . CAROTID STENT    .  CESAREAN SECTION WITH BILATERAL TUBAL LIGATION    . CORONARY ARTERY BYPASS GRAFT  2007   2V  . RIGHT/LEFT HEART CATH AND CORONARY/GRAFT ANGIOGRAPHY N/A 03/24/2018   Procedure: RIGHT/LEFT HEART CATH AND CORONARY/GRAFT ANGIOGRAPHY;  Surgeon: Belva Crome, MD;  Location: [place] [facility];  Service: Cardiovascular;  Laterality: N/A;  . TRACHEOSTOMY      Current Medications: Outpatient Medications Prior to Visit  Medication Sig Dispense Refill  . albuterol (PROVENTIL HFA;VENTOLIN HFA) 108 (90 Base) MCG/ACT inhaler Inhale 1-2 puffs into the lungs every 6 (six) hours as needed for wheezing or shortness of breath.    Marland Kitchen albuterol (PROVENTIL) (2.5 MG/3ML) 0.083% nebulizer solution Take 3 mLs (2.5 mg total) by nebulization every 4 (four) hours as needed for wheezing or shortness of breath. 75 mL 12  . clopidogrel (PLAVIX) 75 MG tablet TAKE 1 TABLET BY MOUTH ONCE DAILY **NEED  OFFICE  VISIT** 90 tablet 3  . furosemide (LASIX) 40 MG tablet Take 1 tablet (40 mg total) by mouth 2 (two) times daily. 30 tablet 0  . gabapentin (NEURONTIN) 300 MG capsule Take 300-600 mg by mouth 2 (two) times daily.    . insulin degludec (TRESIBA FLEXTOUCH) 100 UNIT/ML SOPN FlexTouch Pen Inject 25 Units into the skin daily at 10 pm.    . lisinopril (PRINIVIL,ZESTRIL) 20 MG tablet Take 1 tablet (20 mg total) by mouth 2 (two) times daily. 180 tablet 3  . metoprolol (LOPRESSOR) 50 MG tablet Take 50 mg by mouth 2 (two) times daily.    . nitroGLYCERIN (NITROSTAT) 0.4 MG SL tablet Place 0.4 mg under the tongue every 5 (five) minutes as needed for chest pain.    . pantoprazole (PROTONIX) 40 MG tablet Take 40 mg by mouth daily.    . rosuvastatin (CRESTOR) 10 MG tablet Take 1 tablet (10 mg total) by mouth daily. 30 tablet 6  . warfarin (COUMADIN) 5 MG tablet Take 2.5-5 mg by mouth See admin instructions. 5mg  on Monday and Friday; 2.5 mg rest of the days     No facility-administered medications prior to visit.      Allergies:    Penicillins; Ciprofloxacin; and Ibuprofen   Social History   Socioeconomic History  . Marital status: Married    Spouse name: Not on file  . Number of children: Not on file  . Years of education: Not on file  . Highest education level: Not on file  Occupational History  . Not on file  Social Needs  . Financial resource strain: Not on file  . Food insecurity:    Worry: Not on file    Inability: Not on file  . Transportation needs:    Medical: Not on file    Non-medical: Not on file  Tobacco Use  . Smoking status: Current Every Day Smoker    Packs/day: 0.75    Types: Cigarettes    Start date: 10/23/1978  . Smokeless tobacco: Never Used  .  Tobacco comment: 1/2 pack - trying to cut back   Substance and Sexual Activity  . Alcohol use: No    Alcohol/week: 0.0 standard drinks  . Drug use: No  . Sexual activity: Not on file  Lifestyle  . Physical activity:    Days per week: Not on file    Minutes per session: Not on file  . Stress: Not on file  Relationships  . Social connections:    Talks on phone: Not on file    Gets together: Not on file    Attends religious service: Not on file    Active member of club or organization: Not on file    Attends meetings of clubs or organizations: Not on file    Relationship status: Not on file  Other Topics Concern  . Not on file  Social History Narrative  . Not on file     Family History:  The patient's family history includes Diabetes in her father and mother; Hypertension in her father and mother.   Review of Systems:   Please see the history of present illness.     General:  No chills, fever, or night sweats. Positive for weight gain.   Cardiovascular:  No edema, orthopnea, palpitations, paroxysmal nocturnal dyspnea.  Positive for chest pain and dyspnea on exertion. Dermatological: No rash, lesions/masses Respiratory: No cough, dyspnea Urologic: No hematuria, dysuria Abdominal:   No nausea, vomiting, diarrhea, bright red blood  per rectum, melena, or hematemesis Neurologic:  No visual changes, wkns, changes in mental status. All other systems reviewed and are otherwise negative except as noted above.   Physical Exam:    VS:  BP 124/80   Pulse 68   Ht 4\' 10"  (1.473 m)   Wt 195 lb 3.2 oz (88.5 kg)   SpO2 93%   BMI 40.80 kg/m    General: Well developed, obese Caucasian female appearing in no acute distress. Head: Normocephalic, atraumatic, sclera non-icteric, no xanthomas, nares are without discharge.  Neck: No carotid bruits. JVD not elevated.  Lungs: Respirations regular and unlabored, without wheezes or rales.  Heart: Regular rate and rhythm. No S3 or S4.  No murmur, no rubs, or gallops appreciated. On 3L [place].  Abdomen: Soft, non-tender, non-distended with normoactive bowel sounds. No hepatomegaly. No rebound/guarding. No obvious abdominal masses. Msk:  Strength and tone appear normal for age. No joint deformities or effusions. Extremities: No clubbing or cyanosis. Trace lower extremity edema.  Distal pedal pulses are 2+ bilaterally.  Scaly plaques along patient's hands and legs bilaterally, most consistent with psoriasis. Neuro: Alert and oriented X 3. Moves all extremities spontaneously. No focal deficits noted. Psych:  Responds to questions appropriately with a normal affect. Skin: No rashes or lesions noted  Wt Readings from Last 3 Encounters:  05/25/18 195 lb 3.2 oz (88.5 kg)  04/20/18 193 lb 6.4 oz (87.7 kg)  03/29/18 173 lb 1 oz (78.5 kg)     Studies/Labs Reviewed:   EKG:  EKG is not ordered today.    Recent Labs: 08/01/2017: TSH 0.651 08/02/2017: ALT 13 08/03/2017: Magnesium 2.3 03/22/2018: B Natriuretic Peptide 1,049.0 03/29/2018: BUN 14; Creatinine, Ser 1.06; Hemoglobin 12.8; Platelets 203; Potassium 4.0; Sodium 140   Lipid Panel    Component Value Date/Time   CHOL 167 03/24/2018 0451   TRIG 101 03/24/2018 0451   HDL 44 03/24/2018 0451   CHOLHDL 3.8 03/24/2018 0451   VLDL 20  03/24/2018 0451   LDLCALC 103 (H) 03/24/2018 0451    Additional  studies/ records that were reviewed today include:   Cardiac Catheterization: 03/2018  Severe native coronary artery disease with total occlusion of the proximal LAD, total occlusion of the proximal RCA, and patent circumflex with patent proximal stent with eccentric 50% proximal narrowing.  Distal obtuse marginal branches are severely and diffusely diseased.  Bypass graft failure with total occlusion of SVG to RCA.  Patent LIMA to LAD.  Right coronary territory is supplied by collaterals from LAD and circumflex.  Left ventricular systolic dysfunction with EF 35 to 45%.  Mid to distal inferior wall akinesis.  Inferobasal and inferoapical wall severely hypokinetic.  Left ventricular end-diastolic pressure elevated at 19 mmHg  Moderate to severe pulmonary hypertension  RECOMMENDATIONS:   Resume Coumadin therapy overlap with heparin  Continue Plavix  Management of pulmonary status to maintain adequate oxygenation which may help decrease severity of pulmonary hypertension.   Recommend to resume Warfarin, at currently prescribed dose and frequency, on today.  Recommend concurrent antiplatelet therapy of Clopidogrel 75mg  daily for Indefinite.  Echocardiogram: 03/2018 Study Conclusions  - Left ventricle: The cavity size was normal. There was mild focal   basal hypertrophy of the septum. Systolic function was normal.   The estimated ejection fraction was 50%. Doppler parameters are   consistent with a reversible restrictive pattern, indicative of   decreased left ventricular diastolic compliance and/or increased   left atrial pressure (grade 3 diastolic dysfunction). - Regional wall motion abnormality: Akinesis of the mid   anteroseptal myocardium; hypokinesis of the mid inferoseptal,   mid-apical inferior, and apical septal myocardium. - Aortic valve: Transvalvular velocity was within the normal range.   There  was no stenosis. There was no regurgitation. - Mitral valve: Mobility was mildly restricted. There was mild   regurgitation. - Left atrium: The atrium was moderately dilated. - Right ventricle: The cavity size was mildly dilated. Wall   thickness was normal. Systolic function was normal. - Right atrium: The atrium was moderately dilated. - Atrial septum: No defect or patent foramen ovale was identified. - Tricuspid valve: There was mild regurgitation. - Pulmonic valve: There was no significant regurgitation. - Pulmonary arteries: PA peak pressure: 42 mm Hg (S).  Impressions:  - [place] EF about 50% with regional wall motion abnormalities as   above. Elevated right sided filling pressures.  Assessment:    1. Coronary artery disease of bypass graft of native heart with stable angina pectoris ([place])   2. Chronic diastolic heart failure ([place])   3. Essential hypertension   4. Hyperlipidemia, unspecified hyperlipidemia type   5. Lupus anticoagulant syndrome ([place])   6. Tobacco abuse   7. Psoriasis      Plan:   In order of problems listed above:  1. CAD - s/p CABG in 2007 with DES to SVG-PDA in 10/2015. Recent catheterization showed total occlusion of the SVG to RCA with patent LIMA to LAD. The RCA territory was supplied by collaterals from the LAD and LCx, therefore continued medical management was recommended. - She does report intermittent episodes of chest discomfort which typically occur with activity and resolve with rest. Recent catheterization results were reviewed again in detail with the patient and her husband during today's visit. - Will continue on the Plavix, beta-blocker, and statin therapy. Not on concurrent ASA given the need for anticoagulation. Will add Imdur 30mg  daily for antianginal benefit.   2. Chronic Diastolic CHF - EF was reduced to 35 to 45% by recent cardiac catheterization but echocardiogram imaging showed this was only  mildly reduced to 50% with grade 3  diastolic dysfunction noted. - Volume status is difficult to manage in the setting of her high sodium diet and decreased compliance with Lasix due to frequent urination. I encouraged compliance with Lasix as currently prescribed at 40 mg twice daily but I doubt she will take this as a twice daily dose, therefore asked her to take at minimum 40 mg daily and to certainly take an extra tablet for weight gain greater than 3 pounds overnight or 5 pounds in 1 week.  3. HTN - BP is well controlled at 124/80 during today's visit. - Continue Lisinopril 20 mg twice daily and Lopressor 50 mg twice daily.  4. HLD - FLP in 03/2018 showed total cholesterol 167, triglycerides 101, HDL 44, and LDL 103. She has been intolerant to higher dose statins in the past and remains on Crestor 10 mg daily.  5. Lupus Anticoagulant with History of DVT - She denies any evidence of active bleeding. Remains on Coumadin for anticoagulation.  6. Tobacco Use - Continues to smoke approximately 1 pack/day.  Cessation was advised.  7. Psoriasis - She initially reports having pain along her hands and legs bilaterally. Upon examination, she has scaly plaques noted which appears most consistent with psoriasis and she has been informed of this diagnosis in the past but has never been treated. - Will place a referral to Dermatology per the patient's request.   Medication Adjustments/Labs and Tests Ordered: Current medicines are reviewed at length with the patient today.  Concerns regarding medicines are outlined above.  Medication changes, Labs and Tests ordered today are listed in the Patient Instructions below. Patient Instructions  Medication Instructions:  Start imdur 30 mg daily  Labwork: none  Testing/Procedures: none  Follow-Up: Your physician recommends that you schedule a follow-up appointment in: 2-3 months    Any Other Special Instructions Will Be Listed Below (If Applicable).  You have been referred to  dermatology   I will contact advanced home care concerning portable air concentrator    If you need a refill on your cardiac medications before your next appointment, please call your pharmacy.    Signed, Erma Heritage, PA-C  05/25/2018 5:09 PM    [address] [place], [place] [address] Phone: [phone]

## 2018-05-29 ENCOUNTER — Other Ambulatory Visit: Payer: Self-pay | Admitting: Cardiology

## 2018-07-04 DIAGNOSIS — F17210 Nicotine dependence, cigarettes, uncomplicated: Secondary | ICD-10-CM | POA: Diagnosis not present

## 2018-07-04 DIAGNOSIS — Z6841 Body Mass Index (BMI) 40.0 and over, adult: Secondary | ICD-10-CM | POA: Diagnosis not present

## 2018-07-04 DIAGNOSIS — J449 Chronic obstructive pulmonary disease, unspecified: Secondary | ICD-10-CM | POA: Diagnosis not present

## 2018-07-04 DIAGNOSIS — J40 Bronchitis, not specified as acute or chronic: Secondary | ICD-10-CM | POA: Diagnosis not present

## 2018-07-04 DIAGNOSIS — I82409 Acute embolism and thrombosis of unspecified deep veins of unspecified lower extremity: Secondary | ICD-10-CM | POA: Diagnosis not present

## 2018-07-04 DIAGNOSIS — Z299 Encounter for prophylactic measures, unspecified: Secondary | ICD-10-CM | POA: Diagnosis not present

## 2018-07-04 DIAGNOSIS — I10 Essential (primary) hypertension: Secondary | ICD-10-CM | POA: Diagnosis not present

## 2018-07-04 DIAGNOSIS — E1140 Type 2 diabetes mellitus with diabetic neuropathy, unspecified: Secondary | ICD-10-CM | POA: Diagnosis not present

## 2018-07-06 DIAGNOSIS — M159 Polyosteoarthritis, unspecified: Secondary | ICD-10-CM | POA: Diagnosis not present

## 2018-07-06 DIAGNOSIS — I10 Essential (primary) hypertension: Secondary | ICD-10-CM | POA: Diagnosis not present

## 2018-07-06 DIAGNOSIS — I2510 Atherosclerotic heart disease of native coronary artery without angina pectoris: Secondary | ICD-10-CM | POA: Diagnosis not present

## 2018-07-06 DIAGNOSIS — E119 Type 2 diabetes mellitus without complications: Secondary | ICD-10-CM | POA: Diagnosis not present

## 2018-07-07 DIAGNOSIS — I10 Essential (primary) hypertension: Secondary | ICD-10-CM | POA: Diagnosis not present

## 2018-07-07 DIAGNOSIS — I5030 Unspecified diastolic (congestive) heart failure: Secondary | ICD-10-CM | POA: Diagnosis not present

## 2018-07-07 DIAGNOSIS — Z6841 Body Mass Index (BMI) 40.0 and over, adult: Secondary | ICD-10-CM | POA: Diagnosis not present

## 2018-07-07 DIAGNOSIS — Z299 Encounter for prophylactic measures, unspecified: Secondary | ICD-10-CM | POA: Diagnosis not present

## 2018-07-07 DIAGNOSIS — J449 Chronic obstructive pulmonary disease, unspecified: Secondary | ICD-10-CM | POA: Diagnosis not present

## 2018-07-07 DIAGNOSIS — J40 Bronchitis, not specified as acute or chronic: Secondary | ICD-10-CM | POA: Diagnosis not present

## 2018-08-07 ENCOUNTER — Ambulatory Visit: Payer: Medicare Other | Admitting: Cardiovascular Disease

## 2018-08-12 NOTE — Progress Notes (Deleted)
Cardiology Office Note   Date:  08/12/2018   ID:  Theresa [name], DOB [birth year], MRN [id number]  PCP:  Theresa Blitz, MD  Cardiologist:   Theresa Breeding, MD    No chief complaint on file.     History of Present Illness: Theresa [name] is a 52 y.o. female who presents for follow up of diastolic HF.  She has a history of two-vessel CABG with stenting prior to her CABG performed in [place] with CABG performed in [place] ([place]) at [facility] in 2007.  She was hospitalized in May 2017 for acute on chronic hypoxic respiratory failure multifactorial in etiology due to acute on chronic diastolic heart failure, pneumonia, and COPD exacerbation.   She was hospitalized for an acute inferior STEMI in February 2017 and underwent drug-eluting stent placement in the distal SVG to the PDA.  She also has a history of diabetes, hypertension, hyperlipidemia, lupus anticoagulant with DVT and is on chronic anticoagulant therapy.   She presented in July with chest pain.   She had a cath with results below.   She was managed medically.  She had a mean pulmonary pressure of 40.  She had acute on chronic HF with an EF of 50%.  She was diuresed about 27 lbs with a discharge weight of 173 lbs.   However, it seems at the last visit that her home baseline weight was about 189 lbs.  At the last visit she was not taking her Lasix as suggested because it made her urinate too much and she was eating out frequently.    ***    I reviewed hospital records for this visit.    Since going home she has not been weighing herself every morning but she does think that her weights at home when she got home were around 189 pounds on her scale.  She thinks she is kept her weight down and that her breathing is much better than when she went into the hospital.  She is not describing PND or orthopnea.  She still unfortunately smokes cigarettes.  She is not having any new chest pressure, neck or arm discomfort.  Is not  having any new palpitations, presyncope or syncope.  She has no new edema.  She does have some chronic exertional angina.   Past Medical History:  Diagnosis Date  . CAD (coronary artery disease)    a. s/p prior PCI. b. CABG 2007 at [facility] in [place] 2007. c. inferior STEMI 10/2015 s/p DES to dSVG-PDA.  Marland Kitchen Cervical cancer ([place])   . Chronic diastolic CHF (congestive heart failure) ([place])   . Chronic respiratory failure ([place])    s/p tracheostomy 2002  . Chronic RUQ pain   . COPD (chronic obstructive pulmonary disease) ([place])   . Diabetes mellitus ([place])   . DVT (deep venous thrombosis) ([place])   . Endometriosis   . History of gallstones 01/2016   seen on Ultrasound  . History of HIDA scan 11/2016   normal  . HTN (hypertension)   . Hyperlipidemia   . Lupus anticoagulant disorder (HCC)    on coumadin  . Morbid obesity ([place])   . ST elevation (STEMI) myocardial infarction involving right coronary artery ([place]) 10/29/15   stent to VG to PDA  . Toe fracture, right 03/29/2018  . Tracheostomy in place [facility]), chronic since 2002 11/03/2015    Past Surgical History:  Procedure Laterality Date  . CARDIAC CATHETERIZATION N/A 10/29/2015   Procedure: Left Heart  Cath and Cors/Grafts Angiography;  Surgeon: Burnell Blanks, MD;  Location: [place] [facility];  Service: Cardiovascular;  Laterality: N/A;  . CARDIAC CATHETERIZATION  10/29/2015   Procedure: Coronary Stent Intervention;  Surgeon: Burnell Blanks, MD;  Location: [place] [facility];  Service: Cardiovascular;;  . CAROTID STENT    . CESAREAN SECTION WITH BILATERAL TUBAL LIGATION    . CORONARY ARTERY BYPASS GRAFT  2007   2V  . RIGHT/LEFT HEART CATH AND CORONARY/GRAFT ANGIOGRAPHY N/A 03/24/2018   Procedure: RIGHT/LEFT HEART CATH AND CORONARY/GRAFT ANGIOGRAPHY;  Surgeon: Belva Crome, MD;  Location: [place] [facility];  Service: Cardiovascular;  Laterality: N/A;  . TRACHEOSTOMY       Current Outpatient Medications    Medication Sig Dispense Refill  . albuterol (PROVENTIL HFA;VENTOLIN HFA) 108 (90 Base) MCG/ACT inhaler Inhale 1-2 puffs into the lungs every 6 (six) hours as needed for wheezing or shortness of breath.    Marland Kitchen albuterol (PROVENTIL) (2.5 MG/3ML) 0.083% nebulizer solution Take 3 mLs (2.5 mg total) by nebulization every 4 (four) hours as needed for wheezing or shortness of breath. 75 mL 12  . clopidogrel (PLAVIX) 75 MG tablet TAKE 1 TABLET BY MOUTH ONCE DAILY *NEED OFFICE VISIT* 90 tablet 3  . furosemide (LASIX) 40 MG tablet Take 1 tablet (40 mg total) by mouth 2 (two) times daily. 30 tablet 0  . gabapentin (NEURONTIN) 300 MG capsule Take 300-600 mg by mouth 2 (two) times daily.    . insulin degludec (TRESIBA FLEXTOUCH) 100 UNIT/ML SOPN FlexTouch Pen Inject 25 Units into the skin daily at 10 pm.    . isosorbide mononitrate (IMDUR) 30 MG 24 hr tablet Take 1 tablet (30 mg total) by mouth daily. 90 tablet 3  . lisinopril (PRINIVIL,ZESTRIL) 20 MG tablet Take 1 tablet (20 mg total) by mouth 2 (two) times daily. 180 tablet 3  . metoprolol (LOPRESSOR) 50 MG tablet Take 50 mg by mouth 2 (two) times daily.    . nitroGLYCERIN (NITROSTAT) 0.4 MG SL tablet Place 0.4 mg under the tongue every 5 (five) minutes as needed for chest pain.    . pantoprazole (PROTONIX) 40 MG tablet Take 40 mg by mouth daily.    . rosuvastatin (CRESTOR) 10 MG tablet Take 1 tablet (10 mg total) by mouth daily. 30 tablet 6  . warfarin (COUMADIN) 5 MG tablet Take 2.5-5 mg by mouth See admin instructions. 5mg  on Monday and Friday; 2.5 mg rest of the days     No current facility-administered medications for this visit.     Allergies:   Penicillins; Ciprofloxacin; and Ibuprofen    ROS:  Please see the history of present illness.   Otherwise, review of systems are positive for ***.   All other systems are reviewed and negative.    PHYSICAL EXAM: VS:  There were no vitals taken for this visit. , BMI There is no height or weight on file to  calculate BMI. GENERAL:  Well appearing NECK:  No jugular venous distention, waveform within normal limits, carotid upstroke brisk and symmetric, no bruits, no thyromegaly LUNGS:  Clear to auscultation bilaterally CHEST:  Well healed sternotomy scar. HEART:  PMI not displaced or sustained,S1 and S2 within normal limits, no S3, no S4, no clicks, no rubs, *** murmurs ABD:  Flat, positive bowel sounds normal in frequency in pitch, no bruits, no rebound, no guarding, no midline pulsatile mass, no hepatomegaly, no splenomegaly EXT:  2 plus pulses throughout, no edema, no cyanosis no  clubbing    ***NERAL:  Well appearing NECK:  No jugular venous distention, waveform within normal limits, carotid upstroke brisk and symmetric, no bruits, no thyromegaly, tracheostomy in place LUNGS:  Clear to auscultation bilaterally CHEST:  Well healed sternotomy scar. HEART:  PMI not displaced or sustained,S1 and S2 within normal limits, no S3, no S4, no clicks, no rubs, no murmurs ABD:  Flat, positive bowel sounds normal in frequency in pitch, no bruits, no rebound, no guarding, no midline pulsatile mass, no hepatomegaly, no splenomegaly EXT:  2 plus pulses throughout, no edema, no cyanosis no clubbing    EKG:  EKG is ***ordered today. ***  Cardiac Cath 03/24/18  Severe native coronary artery disease with total occlusion of the proximal LAD, total occlusion of the proximal RCA, and patent circumflex with patent proximal stent with eccentric 50% proximal narrowing. Distal obtuse marginal branches are severely and diffusely diseased.  Bypass graft failure with total occlusion of SVG to RCA.  Patent LIMA to LAD.  Right coronary territory is supplied by collaterals from LAD and circumflex.  Left ventricular systolic dysfunction with EF 35 to 45%. Mid to distal inferior wall akinesis. Inferobasal and inferoapical wall severely hypokinetic. Left ventricular end-diastolic pressure elevated at 19  mmHg  Moderate to severe pulmonary hypertension  Recent Labs: 03/22/2018: B Natriuretic Peptide 1,049.0 03/29/2018: BUN 14; Creatinine, Ser 1.06; Hemoglobin 12.8; Platelets 203; Potassium 4.0; Sodium 140    Lipid Panel    Component Value Date/Time   CHOL 167 03/24/2018 0451   TRIG 101 03/24/2018 0451   HDL 44 03/24/2018 0451   CHOLHDL 3.8 03/24/2018 0451   VLDL 20 03/24/2018 0451   LDLCALC 103 (H) 03/24/2018 0451      Wt Readings from Last 3 Encounters:  05/25/18 195 lb 3.2 oz (88.5 kg)  04/20/18 193 lb 6.4 oz (87.7 kg)  03/29/18 173 lb 1 oz (78.5 kg)      Other studies Reviewed: Additional studies/ records that were reviewed today include:  *** Review of the above records demonstrates:  ***   ASSESSMENT AND PLAN:  CAD:    *** We had a long discussion and I pulled out a model and the cath report drawings and went over her anatomy so that she has a better understanding of this.  She needs to stop her aspirin but remain on Plavix and her anticoagulant.   Essential HTN:     *** This is being managed in the context of treating his CHFyle changes (TLC).  ACUTE ON CHRONIC CHF:    ***She seems to be euvolemic.  She would not be able to afford Entresto.  I am going to increase her lisinopril 20 mg twice daily and get a basic metabolic profile in 1 week.  Hyperlipidemia:    This is followed by Theresa Blitz, MD.  *** The LDL needs to be less than 70.  I will order a lipid when she comes back for labs.   Lupus anticoagulant with DVT:    ***  She continues on warfarin.   Tobacco abuse:   She is trying. ***  I gave her instructions to call [phone]    Current medicines are reviewed at length with the patient today.  The patient does not have concerns regarding medicines.  The following changes have been made:  *** Labs/ tests ordered today include: ***  No orders of the defined types were placed in this encounter.    Disposition:   FU with APP in *** month.  Signed, Theresa Breeding, MD  08/12/2018 10:37 PM    [facility]

## 2018-08-16 ENCOUNTER — Ambulatory Visit: Payer: Medicare Other | Admitting: Cardiology

## 2018-09-14 DIAGNOSIS — Z2821 Immunization not carried out because of patient refusal: Secondary | ICD-10-CM | POA: Diagnosis not present

## 2018-09-14 DIAGNOSIS — E1165 Type 2 diabetes mellitus with hyperglycemia: Secondary | ICD-10-CM | POA: Diagnosis not present

## 2018-09-14 DIAGNOSIS — Z299 Encounter for prophylactic measures, unspecified: Secondary | ICD-10-CM | POA: Diagnosis not present

## 2018-09-14 DIAGNOSIS — I5030 Unspecified diastolic (congestive) heart failure: Secondary | ICD-10-CM | POA: Diagnosis not present

## 2018-09-14 DIAGNOSIS — F17210 Nicotine dependence, cigarettes, uncomplicated: Secondary | ICD-10-CM | POA: Diagnosis not present

## 2018-09-14 DIAGNOSIS — I82409 Acute embolism and thrombosis of unspecified deep veins of unspecified lower extremity: Secondary | ICD-10-CM | POA: Diagnosis not present

## 2018-09-14 DIAGNOSIS — I10 Essential (primary) hypertension: Secondary | ICD-10-CM | POA: Diagnosis not present

## 2018-09-14 DIAGNOSIS — Z6841 Body Mass Index (BMI) 40.0 and over, adult: Secondary | ICD-10-CM | POA: Diagnosis not present

## 2018-09-18 DIAGNOSIS — E119 Type 2 diabetes mellitus without complications: Secondary | ICD-10-CM | POA: Diagnosis not present

## 2018-09-18 DIAGNOSIS — M159 Polyosteoarthritis, unspecified: Secondary | ICD-10-CM | POA: Diagnosis not present

## 2018-09-18 DIAGNOSIS — I2510 Atherosclerotic heart disease of native coronary artery without angina pectoris: Secondary | ICD-10-CM | POA: Diagnosis not present

## 2018-09-18 DIAGNOSIS — I10 Essential (primary) hypertension: Secondary | ICD-10-CM | POA: Diagnosis not present

## 2018-09-18 NOTE — Progress Notes (Deleted)
Cardiology Office Note   Date:  09/18/2018   ID:  IVYROSE HASHMAN, DOB [birth year], MRN [id number]  PCP:  Monico Blitz, MD  Cardiologist:   [name], MD    No chief complaint on file.     History of Present Illness: Theresa [name] is a 53 y.o. female who presents for follow up of diastolic HF.  She has a history of two-vessel CABG with stenting prior to her CABG performed in [place] with CABG performed in [place] ([place]) at [facility] in 2007.  She was hospitalized in May 2017 for acute on chronic hypoxic respiratory failure multifactorial in etiology due to acute on chronic diastolic heart failure, pneumonia, and COPD exacerbation.   She was hospitalized for an acute inferior STEMI in February 2017 and underwent drug-eluting stent placement in the distal SVG to the PDA.  She also has a history of diabetes, hypertension, hyperlipidemia, lupus anticoagulant with DVT and is on chronic anticoagulant therapy.   She presented in July with chest pain.   She had a cath with results below.   She was managed medically.  She had a mean pulmonary pressure of 40.  She had acute on chronic HF with an EF of 50%.  She is being followed for continued volume issues.  ***   She was diuresed about 27 lbs with a discharge weight of 173 lbs.   Since going home she has not been weighing herself every morning but she does think that her weights at home when she got home were around 189 pounds on her scale.  She thinks she is kept her weight down and that her breathing is much better than when she went into the hospital.  She is not describing PND or orthopnea.  She still unfortunately smokes cigarettes.  She is not having any new chest pressure, neck or arm discomfort.  Is not having any new palpitations, presyncope or syncope.  She has no new edema.  She does have some chronic exertional angina.   Past Medical History:  Diagnosis Date  . CAD (coronary artery disease)    a. s/p prior  PCI. b. CABG 2007 at [facility] in [place] 2007. c. inferior STEMI 10/2015 s/p DES to dSVG-PDA.  Marland Kitchen Cervical cancer ([place])   . Chronic diastolic CHF (congestive heart failure) ([place])   . Chronic respiratory failure ([place])    s/p tracheostomy 2002  . Chronic RUQ pain   . COPD (chronic obstructive pulmonary disease) ([place])   . Diabetes mellitus ([place])   . DVT (deep venous thrombosis) ([place])   . Endometriosis   . History of gallstones 01/2016   seen on Ultrasound  . History of HIDA scan 11/2016   normal  . HTN (hypertension)   . Hyperlipidemia   . Lupus anticoagulant disorder (HCC)    on coumadin  . Morbid obesity ([place])   . ST elevation (STEMI) myocardial infarction involving right coronary artery ([place]) 10/29/15   stent to VG to PDA  . Toe fracture, right 03/29/2018  . Tracheostomy in place [facility]), chronic since 2002 11/03/2015    Past Surgical History:  Procedure Laterality Date  . CARDIAC CATHETERIZATION N/A 10/29/2015   Procedure: Left Heart Cath and Cors/Grafts Angiography;  Surgeon: Burnell Blanks, MD;  Location: [place] [facility];  Service: Cardiovascular;  Laterality: N/A;  . CARDIAC CATHETERIZATION  10/29/2015   Procedure: Coronary Stent Intervention;  Surgeon: Burnell Blanks, MD;  Location: [place] [facility];  Service: Cardiovascular;;  . CAROTID STENT    . CESAREAN SECTION WITH BILATERAL TUBAL LIGATION    . CORONARY ARTERY BYPASS GRAFT  2007   2V  . RIGHT/LEFT HEART CATH AND CORONARY/GRAFT ANGIOGRAPHY N/A 03/24/2018   Procedure: RIGHT/LEFT HEART CATH AND CORONARY/GRAFT ANGIOGRAPHY;  Surgeon: Belva Crome, MD;  Location: [place] [facility];  Service: Cardiovascular;  Laterality: N/A;  . TRACHEOSTOMY       Current Outpatient Medications  Medication Sig Dispense Refill  . albuterol (PROVENTIL HFA;VENTOLIN HFA) 108 (90 Base) MCG/ACT inhaler Inhale 1-2 puffs into the lungs every 6 (six) hours as needed for wheezing or shortness of breath.    Marland Kitchen albuterol  (PROVENTIL) (2.5 MG/3ML) 0.083% nebulizer solution Take 3 mLs (2.5 mg total) by nebulization every 4 (four) hours as needed for wheezing or shortness of breath. 75 mL 12  . clopidogrel (PLAVIX) 75 MG tablet TAKE 1 TABLET BY MOUTH ONCE DAILY *NEED OFFICE VISIT* 90 tablet 3  . furosemide (LASIX) 40 MG tablet Take 1 tablet (40 mg total) by mouth 2 (two) times daily. 30 tablet 0  . gabapentin (NEURONTIN) 300 MG capsule Take 300-600 mg by mouth 2 (two) times daily.    . insulin degludec (TRESIBA FLEXTOUCH) 100 UNIT/ML SOPN FlexTouch Pen Inject 25 Units into the skin daily at 10 pm.    . isosorbide mononitrate (IMDUR) 30 MG 24 hr tablet Take 1 tablet (30 mg total) by mouth daily. 90 tablet 3  . lisinopril (PRINIVIL,ZESTRIL) 20 MG tablet Take 1 tablet (20 mg total) by mouth 2 (two) times daily. 180 tablet 3  . metoprolol (LOPRESSOR) 50 MG tablet Take 50 mg by mouth 2 (two) times daily.    . nitroGLYCERIN (NITROSTAT) 0.4 MG SL tablet Place 0.4 mg under the tongue every 5 (five) minutes as needed for chest pain.    . pantoprazole (PROTONIX) 40 MG tablet Take 40 mg by mouth daily.    . rosuvastatin (CRESTOR) 10 MG tablet Take 1 tablet (10 mg total) by mouth daily. 30 tablet 6  . warfarin (COUMADIN) 5 MG tablet Take 2.5-5 mg by mouth See admin instructions. 5mg  on Monday and Friday; 2.5 mg rest of the days     No current facility-administered medications for this visit.     Allergies:   Penicillins; Ciprofloxacin; and Ibuprofen    ROS:  Please see the history of present illness.   Otherwise, review of systems are positive for ***.   All other systems are reviewed and negative.    PHYSICAL EXAM: VS:  There were no vitals taken for this visit. , BMI There is no height or weight on file to calculate BMI. GENERAL:  Well appearing NECK:  No jugular venous distention, waveform within normal limits, carotid upstroke brisk and symmetric, no bruits, no thyromegaly LUNGS:  Clear to auscultation  bilaterally CHEST:  Unremarkable HEART:  PMI not displaced or sustained,S1 and S2 within normal limits, no S3, no S4, no clicks, no rubs, *** murmurs ABD:  Flat, positive bowel sounds normal in frequency in pitch, no bruits, no rebound, no guarding, no midline pulsatile mass, no hepatomegaly, no splenomegaly EXT:  2 plus pulses throughout, no edema, no cyanosis no clubbing   ***GENERAL:  Well appearing NECK:  No jugular venous distention, waveform within normal limits, carotid upstroke brisk and symmetric, no bruits, no thyromegaly, tracheostomy in place LUNGS:  Clear to auscultation bilaterally CHEST:  Well healed sternotomy scar. HEART:  PMI not displaced or sustained,S1 and S2 within  normal limits, no S3, no S4, no clicks, no rubs, no murmurs ABD:  Flat, positive bowel sounds normal in frequency in pitch, no bruits, no rebound, no guarding, no midline pulsatile mass, no hepatomegaly, no splenomegaly EXT:  2 plus pulses throughout, no edema, no cyanosis no clubbing  EKG:  EKG is *** ordered today.   Cardiac Cath 03/24/18  Severe native coronary artery disease with total occlusion of the proximal LAD, total occlusion of the proximal RCA, and patent circumflex with patent proximal stent with eccentric 50% proximal narrowing. Distal obtuse marginal branches are severely and diffusely diseased.  Bypass graft failure with total occlusion of SVG to RCA.  Patent LIMA to LAD.  Right coronary territory is supplied by collaterals from LAD and circumflex.  Left ventricular systolic dysfunction with EF 35 to 45%. Mid to distal inferior wall akinesis. Inferobasal and inferoapical wall severely hypokinetic. Left ventricular end-diastolic pressure elevated at 19 mmHg  Moderate to severe pulmonary hypertension  Recent Labs: 03/22/2018: B Natriuretic Peptide 1,049.0 03/29/2018: BUN 14; Creatinine, Ser 1.06; Hemoglobin 12.8; Platelets 203; Potassium 4.0; Sodium 140    Lipid Panel    Component  Value Date/Time   CHOL 167 03/24/2018 0451   TRIG 101 03/24/2018 0451   HDL 44 03/24/2018 0451   CHOLHDL 3.8 03/24/2018 0451   VLDL 20 03/24/2018 0451   LDLCALC 103 (H) 03/24/2018 0451      Wt Readings from Last 3 Encounters:  05/25/18 195 lb 3.2 oz (88.5 kg)  04/20/18 193 lb 6.4 oz (87.7 kg)  03/29/18 173 lb 1 oz (78.5 kg)      Other studies Reviewed: Additional studies/ records that were reviewed today include: *** Review of the above records demonstrates:  ***   ASSESSMENT AND PLAN:  CAD:   ***   We had a long discussion and I pulled out a model and the cath report drawings and went over her anatomy so that she has a better understanding of this.  She needs to stop her aspirin but remain on Plavix and her anticoagulant.   Essential HTN:    ***  This is being managed in the context of treating his CHFyle changes (TLC).  ACUTE ON CHRONIC CHF:   ***   She seems to be euvolemic.  She would not be able to afford Entresto.  I am going to increase her lisinopril 20 mg twice daily and get a basic metabolic profile in 1 week.  Hyperlipidemia:    This is followed by Monico Blitz, MD.  ***   The LDL needs to be less than 70.  I will order a lipid when she comes back for labs.   Lupus anticoagulant with DVT:    She continues on warfarin.   ***  Tobacco abuse:   She is trying.  ***  I gave her instructions to call [phone] [phone]    Current medicines are reviewed at length with the patient today.  The patient does not have concerns regarding medicines.  The following changes have been made:  *** Labs/ tests ordered today include: ***  No orders of the defined types were placed in this encounter.    Disposition:   FU with ***.   Signed, [name], MD  09/18/2018 10:08 PM    [place]

## 2018-09-20 ENCOUNTER — Ambulatory Visit: Payer: Medicare Other | Admitting: Cardiology

## 2018-09-29 DIAGNOSIS — L409 Psoriasis, unspecified: Secondary | ICD-10-CM | POA: Diagnosis not present

## 2018-09-29 DIAGNOSIS — K8020 Calculus of gallbladder without cholecystitis without obstruction: Secondary | ICD-10-CM | POA: Diagnosis not present

## 2018-09-29 DIAGNOSIS — I10 Essential (primary) hypertension: Secondary | ICD-10-CM | POA: Diagnosis not present

## 2018-09-29 DIAGNOSIS — I82409 Acute embolism and thrombosis of unspecified deep veins of unspecified lower extremity: Secondary | ICD-10-CM | POA: Diagnosis not present

## 2018-09-29 DIAGNOSIS — Z6841 Body Mass Index (BMI) 40.0 and over, adult: Secondary | ICD-10-CM | POA: Diagnosis not present

## 2018-09-29 DIAGNOSIS — Z299 Encounter for prophylactic measures, unspecified: Secondary | ICD-10-CM | POA: Diagnosis not present

## 2018-09-29 DIAGNOSIS — K219 Gastro-esophageal reflux disease without esophagitis: Secondary | ICD-10-CM | POA: Diagnosis not present

## 2018-10-14 ENCOUNTER — Emergency Department (HOSPITAL_COMMUNITY): Payer: Medicare Other

## 2018-10-14 ENCOUNTER — Other Ambulatory Visit: Payer: Self-pay

## 2018-10-14 ENCOUNTER — Inpatient Hospital Stay (HOSPITAL_COMMUNITY)
Admission: EM | Admit: 2018-10-14 | Discharge: 2018-10-19 | DRG: 207 | Disposition: A | Discharge status: Other Acute Inpatient Hospital | Payer: Medicare Other | Attending: Internal Medicine | Admitting: Internal Medicine

## 2018-10-14 ENCOUNTER — Encounter (HOSPITAL_COMMUNITY): Payer: Self-pay | Admitting: *Deleted

## 2018-10-14 DIAGNOSIS — I469 Cardiac arrest, cause unspecified: Secondary | ICD-10-CM | POA: Diagnosis not present

## 2018-10-14 DIAGNOSIS — R079 Chest pain, unspecified: Secondary | ICD-10-CM | POA: Diagnosis not present

## 2018-10-14 DIAGNOSIS — Z4659 Encounter for fitting and adjustment of other gastrointestinal appliance and device: Secondary | ICD-10-CM

## 2018-10-14 DIAGNOSIS — I5043 Acute on chronic combined systolic (congestive) and diastolic (congestive) heart failure: Secondary | ICD-10-CM | POA: Diagnosis not present

## 2018-10-14 DIAGNOSIS — Z833 Family history of diabetes mellitus: Secondary | ICD-10-CM

## 2018-10-14 DIAGNOSIS — Z880 Allergy status to penicillin: Secondary | ICD-10-CM

## 2018-10-14 DIAGNOSIS — Z01818 Encounter for other preprocedural examination: Secondary | ICD-10-CM

## 2018-10-14 DIAGNOSIS — J9690 Respiratory failure, unspecified, unspecified whether with hypoxia or hypercapnia: Secondary | ICD-10-CM

## 2018-10-14 DIAGNOSIS — Z8541 Personal history of malignant neoplasm of cervix uteri: Secondary | ICD-10-CM

## 2018-10-14 DIAGNOSIS — E1110 Type 2 diabetes mellitus with ketoacidosis without coma: Secondary | ICD-10-CM | POA: Diagnosis not present

## 2018-10-14 DIAGNOSIS — Z9851 Tubal ligation status: Secondary | ICD-10-CM

## 2018-10-14 DIAGNOSIS — Y838 Other surgical procedures as the cause of abnormal reaction of the patient, or of later complication, without mention of misadventure at the time of the procedure: Secondary | ICD-10-CM | POA: Diagnosis present

## 2018-10-14 DIAGNOSIS — Z86718 Personal history of other venous thrombosis and embolism: Secondary | ICD-10-CM

## 2018-10-14 DIAGNOSIS — D6862 Lupus anticoagulant syndrome: Secondary | ICD-10-CM | POA: Diagnosis present

## 2018-10-14 DIAGNOSIS — Z7902 Long term (current) use of antithrombotics/antiplatelets: Secondary | ICD-10-CM

## 2018-10-14 DIAGNOSIS — I429 Cardiomyopathy, unspecified: Secondary | ICD-10-CM | POA: Diagnosis present

## 2018-10-14 DIAGNOSIS — R0603 Acute respiratory distress: Secondary | ICD-10-CM | POA: Diagnosis not present

## 2018-10-14 DIAGNOSIS — E1010 Type 1 diabetes mellitus with ketoacidosis without coma: Secondary | ICD-10-CM

## 2018-10-14 DIAGNOSIS — I499 Cardiac arrhythmia, unspecified: Secondary | ICD-10-CM | POA: Diagnosis not present

## 2018-10-14 DIAGNOSIS — Z955 Presence of coronary angioplasty implant and graft: Secondary | ICD-10-CM

## 2018-10-14 DIAGNOSIS — Z8249 Family history of ischemic heart disease and other diseases of the circulatory system: Secondary | ICD-10-CM

## 2018-10-14 DIAGNOSIS — N179 Acute kidney failure, unspecified: Secondary | ICD-10-CM | POA: Diagnosis present

## 2018-10-14 DIAGNOSIS — J9621 Acute and chronic respiratory failure with hypoxia: Secondary | ICD-10-CM | POA: Diagnosis not present

## 2018-10-14 DIAGNOSIS — J9503 Malfunction of tracheostomy stoma: Secondary | ICD-10-CM | POA: Diagnosis not present

## 2018-10-14 DIAGNOSIS — J449 Chronic obstructive pulmonary disease, unspecified: Secondary | ICD-10-CM | POA: Diagnosis present

## 2018-10-14 DIAGNOSIS — Z978 Presence of other specified devices: Secondary | ICD-10-CM

## 2018-10-14 DIAGNOSIS — Z79899 Other long term (current) drug therapy: Secondary | ICD-10-CM

## 2018-10-14 DIAGNOSIS — G4733 Obstructive sleep apnea (adult) (pediatric): Secondary | ICD-10-CM | POA: Diagnosis present

## 2018-10-14 DIAGNOSIS — J9600 Acute respiratory failure, unspecified whether with hypoxia or hypercapnia: Secondary | ICD-10-CM

## 2018-10-14 DIAGNOSIS — I358 Other nonrheumatic aortic valve disorders: Secondary | ICD-10-CM | POA: Diagnosis present

## 2018-10-14 DIAGNOSIS — Z7901 Long term (current) use of anticoagulants: Secondary | ICD-10-CM

## 2018-10-14 DIAGNOSIS — L409 Psoriasis, unspecified: Secondary | ICD-10-CM | POA: Diagnosis present

## 2018-10-14 DIAGNOSIS — Z794 Long term (current) use of insulin: Secondary | ICD-10-CM

## 2018-10-14 DIAGNOSIS — I252 Old myocardial infarction: Secondary | ICD-10-CM

## 2018-10-14 DIAGNOSIS — Z930 Tracheostomy status: Secondary | ICD-10-CM

## 2018-10-14 DIAGNOSIS — I272 Pulmonary hypertension, unspecified: Secondary | ICD-10-CM | POA: Diagnosis present

## 2018-10-14 DIAGNOSIS — R918 Other nonspecific abnormal finding of lung field: Secondary | ICD-10-CM | POA: Diagnosis not present

## 2018-10-14 DIAGNOSIS — Z888 Allergy status to other drugs, medicaments and biological substances status: Secondary | ICD-10-CM

## 2018-10-14 DIAGNOSIS — F17210 Nicotine dependence, cigarettes, uncomplicated: Secondary | ICD-10-CM | POA: Diagnosis present

## 2018-10-14 DIAGNOSIS — G5792 Unspecified mononeuropathy of left lower limb: Secondary | ICD-10-CM | POA: Diagnosis present

## 2018-10-14 DIAGNOSIS — J9601 Acute respiratory failure with hypoxia: Secondary | ICD-10-CM | POA: Diagnosis present

## 2018-10-14 DIAGNOSIS — Z951 Presence of aortocoronary bypass graft: Secondary | ICD-10-CM

## 2018-10-14 DIAGNOSIS — I2510 Atherosclerotic heart disease of native coronary artery without angina pectoris: Secondary | ICD-10-CM | POA: Diagnosis present

## 2018-10-14 DIAGNOSIS — I213 ST elevation (STEMI) myocardial infarction of unspecified site: Secondary | ICD-10-CM | POA: Diagnosis not present

## 2018-10-14 DIAGNOSIS — E1141 Type 2 diabetes mellitus with diabetic mononeuropathy: Secondary | ICD-10-CM | POA: Diagnosis present

## 2018-10-14 DIAGNOSIS — E785 Hyperlipidemia, unspecified: Secondary | ICD-10-CM | POA: Diagnosis present

## 2018-10-14 DIAGNOSIS — I472 Ventricular tachycardia: Secondary | ICD-10-CM | POA: Diagnosis present

## 2018-10-14 DIAGNOSIS — Z95820 Peripheral vascular angioplasty status with implants and grafts: Secondary | ICD-10-CM

## 2018-10-14 DIAGNOSIS — Z6841 Body Mass Index (BMI) 40.0 and over, adult: Secondary | ICD-10-CM

## 2018-10-14 DIAGNOSIS — I471 Supraventricular tachycardia: Secondary | ICD-10-CM | POA: Diagnosis not present

## 2018-10-14 DIAGNOSIS — I110 Hypertensive heart disease with heart failure: Secondary | ICD-10-CM | POA: Diagnosis present

## 2018-10-14 DIAGNOSIS — R0789 Other chest pain: Secondary | ICD-10-CM | POA: Diagnosis not present

## 2018-10-14 LAB — CBC WITH DIFFERENTIAL/PLATELET
Abs Immature Granulocytes: 0.05 10*3/uL (ref 0.00–0.07)
Basophils Absolute: 0.1 10*3/uL (ref 0.0–0.1)
Basophils Relative: 0 %
Eosinophils Absolute: 0.1 10*3/uL (ref 0.0–0.5)
Eosinophils Relative: 1 %
HCT: 55.9 % — ABNORMAL HIGH (ref 36.0–46.0)
HEMOGLOBIN: 18.3 g/dL — AB (ref 12.0–15.0)
Immature Granulocytes: 0 %
Lymphocytes Relative: 33 %
Lymphs Abs: 4.4 10*3/uL — ABNORMAL HIGH (ref 0.7–4.0)
MCH: 32 pg (ref 26.0–34.0)
MCHC: 32.7 g/dL (ref 30.0–36.0)
MCV: 97.9 fL (ref 80.0–100.0)
Monocytes Absolute: 0.7 10*3/uL (ref 0.1–1.0)
Monocytes Relative: 5 %
Neutro Abs: 8.1 10*3/uL — ABNORMAL HIGH (ref 1.7–7.7)
Neutrophils Relative %: 61 %
Platelets: 254 10*3/uL (ref 150–400)
RBC: 5.71 MIL/uL — ABNORMAL HIGH (ref 3.87–5.11)
RDW: 13.2 % (ref 11.5–15.5)
WBC: 13.4 10*3/uL — ABNORMAL HIGH (ref 4.0–10.5)
nRBC: 0 % (ref 0.0–0.2)

## 2018-10-14 LAB — POCT I-STAT 7, (LYTES, BLD GAS, ICA,H+H)
ACID-BASE DEFICIT: 5 mmol/L — AB (ref 0.0–2.0)
Bicarbonate: 22.3 mmol/L (ref 20.0–28.0)
CALCIUM ION: 1.13 mmol/L — AB (ref 1.15–1.40)
HEMATOCRIT: 51 % — AB (ref 36.0–46.0)
Hemoglobin: 17.3 g/dL — ABNORMAL HIGH (ref 12.0–15.0)
O2 SAT: 90 %
Patient temperature: 98.6
Potassium: 3.9 mmol/L (ref 3.5–5.1)
Sodium: 132 mmol/L — ABNORMAL LOW (ref 135–145)
TCO2: 24 mmol/L (ref 22–32)
pCO2 arterial: 46.9 mmHg (ref 32.0–48.0)
pH, Arterial: 7.286 — ABNORMAL LOW (ref 7.350–7.450)
pO2, Arterial: 66 mmHg — ABNORMAL LOW (ref 83.0–108.0)

## 2018-10-14 LAB — COMPREHENSIVE METABOLIC PANEL
ALT: 27 U/L (ref 0–44)
AST: 38 U/L (ref 15–41)
Albumin: 3.5 g/dL (ref 3.5–5.0)
Alkaline Phosphatase: 133 U/L — ABNORMAL HIGH (ref 38–126)
Anion gap: 20 — ABNORMAL HIGH (ref 5–15)
BILIRUBIN TOTAL: 0.8 mg/dL (ref 0.3–1.2)
BUN: 10 mg/dL (ref 6–20)
CO2: 17 mmol/L — ABNORMAL LOW (ref 22–32)
Calcium: 8.8 mg/dL — ABNORMAL LOW (ref 8.9–10.3)
Chloride: 96 mmol/L — ABNORMAL LOW (ref 98–111)
Creatinine, Ser: 0.91 mg/dL (ref 0.44–1.00)
GFR calc Af Amer: >60 mL/min (ref >60)
GFR calc non Af Amer: >60 mL/min (ref >60)
Glucose, Bld: 650 mg/dL (ref 70–99)
Potassium: 4.5 mmol/L (ref 3.5–5.1)
Sodium: 133 mmol/L — ABNORMAL LOW (ref 135–145)
Total Protein: 8.1 g/dL (ref 6.5–8.1)

## 2018-10-14 LAB — BRAIN NATRIURETIC PEPTIDE: B Natriuretic Peptide: 396 pg/mL — ABNORMAL HIGH (ref 0.0–100.0)

## 2018-10-14 LAB — TROPONIN I: Troponin I: <0.03 ng/mL (ref <0.03)

## 2018-10-14 MED ORDER — SODIUM CHLORIDE 0.9 % IV SOLN
2.0000 g | Freq: Three times a day (TID) | INTRAVENOUS | Status: DC
  Administered 2018-10-15 – 2018-10-16 (×4): 2 g via INTRAVENOUS
  Filled 2018-10-14 (×5): qty 2

## 2018-10-14 MED ORDER — IPRATROPIUM BROMIDE 0.02 % IN SOLN
0.5000 mg | Freq: Once | RESPIRATORY_TRACT | Status: AC
  Administered 2018-10-14: 0.5 mg via RESPIRATORY_TRACT
  Filled 2018-10-14: qty 2.5

## 2018-10-14 MED ORDER — ALBUTEROL SULFATE (2.5 MG/3ML) 0.083% IN NEBU
5.0000 mg | INHALATION_SOLUTION | Freq: Once | RESPIRATORY_TRACT | Status: AC
  Administered 2018-10-14: 5 mg via RESPIRATORY_TRACT
  Filled 2018-10-14: qty 6

## 2018-10-14 MED ORDER — DEXTROSE-NACL 5-0.45 % IV SOLN: INTRAVENOUS | Status: DC

## 2018-10-14 MED ORDER — SODIUM CHLORIDE 0.9 % IV SOLN
INTRAVENOUS | Status: DC
  Administered 2018-10-15: via INTRAVENOUS

## 2018-10-14 MED ORDER — INSULIN REGULAR(HUMAN) IN NACL 100-0.9 UT/100ML-% IV SOLN
INTRAVENOUS | Status: DC
  Filled 2018-10-14 (×2): qty 100

## 2018-10-14 NOTE — ED Notes (Signed)
Pt c/o some chest pain  She was given 324mg  of aspirin ion the way here with 2 sl nitro given also

## 2018-10-14 NOTE — Progress Notes (Signed)
Arterial gas drawn while patient was on 98% trach collar along with 100% NRB.Marland Kitchen

## 2018-10-14 NOTE — Progress Notes (Signed)
Pharmacy Antibiotic Note  Theresa [name] is a 53 y.o. female admitted on 10/14/2018 with pneumonia.  Pharmacy has been consulted for Vancomycin dosing for total of 8 days. Pt already on Aztreonam per MD x 8 days.  Plan: Vancomycin 2gm IV now then 1000 mg IV Q 24 hrs (8 days total). Goal AUC 400-550. Expected AUC: 457 SCr used: 1 Will f/u renal function, micro data, and pt's clinical condition Vanc levels prn     Temp (24hrs), Avg:97.4 F (36.3 C), Min:97.4 F (36.3 C), Max:97.4 F (36.3 C)  Recent Labs  Lab 10/14/18 2247  WBC 13.4*  CREATININE 0.91    CrCl cannot be calculated (Unknown ideal weight.).    Allergies  Allergen Reactions  . Penicillins Rash    Has patient had a PCN reaction causing immediate rash, facial/tongue/throat swelling, SOB or lightheadedness with hypotension: Yes Has patient had a PCN reaction causing severe rash involving mucus membranes or skin necrosis: No Has patient had a PCN reaction that required hospitalization No Has patient had a PCN reaction occurring within the last 10 years: No If all of the above answers are "NO", then may proceed with Cephalosporin use.   REACTION: rash  . Ciprofloxacin     nausea  . Ibuprofen Rash    swelling in leg     Antimicrobials this admission: 2/9 Vanc >> 2/16 2/9 Aztreonam >> 2/16  Microbiology results: Pending  Thank you for allowing pharmacy to be a part of this patient's care.  Sherlon Handing, PharmD, BCPS Clinical pharmacist  **Pharmacist phone directory can now be found on [email or web address] ([email or web address]).  Listed under [place]. 10/14/2018 11:58 PM

## 2018-10-14 NOTE — ED Notes (Signed)
pts pulse is slowing   Her breathing is getting less labored

## 2018-10-14 NOTE — ED Triage Notes (Signed)
The pt arrived by [facility]  Pt is trached very sob at present hx mi etc

## 2018-10-15 ENCOUNTER — Inpatient Hospital Stay (HOSPITAL_COMMUNITY): Payer: Medicare Other

## 2018-10-15 ENCOUNTER — Inpatient Hospital Stay (HOSPITAL_COMMUNITY): Payer: Medicare Other | Admitting: Anesthesiology

## 2018-10-15 DIAGNOSIS — Z01818 Encounter for other preprocedural examination: Secondary | ICD-10-CM

## 2018-10-15 DIAGNOSIS — R0603 Acute respiratory distress: Secondary | ICD-10-CM

## 2018-10-15 DIAGNOSIS — E785 Hyperlipidemia, unspecified: Secondary | ICD-10-CM | POA: Diagnosis present

## 2018-10-15 DIAGNOSIS — Z955 Presence of coronary angioplasty implant and graft: Secondary | ICD-10-CM | POA: Diagnosis not present

## 2018-10-15 DIAGNOSIS — J9621 Acute and chronic respiratory failure with hypoxia: Secondary | ICD-10-CM | POA: Diagnosis not present

## 2018-10-15 DIAGNOSIS — Z794 Long term (current) use of insulin: Secondary | ICD-10-CM | POA: Diagnosis not present

## 2018-10-15 DIAGNOSIS — Z978 Presence of other specified devices: Secondary | ICD-10-CM

## 2018-10-15 DIAGNOSIS — I25118 Atherosclerotic heart disease of native coronary artery with other forms of angina pectoris: Secondary | ICD-10-CM | POA: Diagnosis not present

## 2018-10-15 DIAGNOSIS — I5043 Acute on chronic combined systolic (congestive) and diastolic (congestive) heart failure: Secondary | ICD-10-CM | POA: Diagnosis not present

## 2018-10-15 DIAGNOSIS — G4733 Obstructive sleep apnea (adult) (pediatric): Secondary | ICD-10-CM | POA: Diagnosis present

## 2018-10-15 DIAGNOSIS — I110 Hypertensive heart disease with heart failure: Secondary | ICD-10-CM | POA: Diagnosis present

## 2018-10-15 DIAGNOSIS — Z86718 Personal history of other venous thrombosis and embolism: Secondary | ICD-10-CM | POA: Diagnosis not present

## 2018-10-15 DIAGNOSIS — I429 Cardiomyopathy, unspecified: Secondary | ICD-10-CM | POA: Diagnosis not present

## 2018-10-15 DIAGNOSIS — J90 Pleural effusion, not elsewhere classified: Secondary | ICD-10-CM | POA: Diagnosis not present

## 2018-10-15 DIAGNOSIS — E1010 Type 1 diabetes mellitus with ketoacidosis without coma: Secondary | ICD-10-CM | POA: Diagnosis not present

## 2018-10-15 DIAGNOSIS — G5792 Unspecified mononeuropathy of left lower limb: Secondary | ICD-10-CM | POA: Diagnosis not present

## 2018-10-15 DIAGNOSIS — I272 Pulmonary hypertension, unspecified: Secondary | ICD-10-CM | POA: Diagnosis present

## 2018-10-15 DIAGNOSIS — R0602 Shortness of breath: Secondary | ICD-10-CM | POA: Diagnosis not present

## 2018-10-15 DIAGNOSIS — J9601 Acute respiratory failure with hypoxia: Secondary | ICD-10-CM

## 2018-10-15 DIAGNOSIS — I252 Old myocardial infarction: Secondary | ICD-10-CM | POA: Diagnosis not present

## 2018-10-15 DIAGNOSIS — J9503 Malfunction of tracheostomy stoma: Secondary | ICD-10-CM | POA: Diagnosis not present

## 2018-10-15 DIAGNOSIS — N179 Acute kidney failure, unspecified: Secondary | ICD-10-CM | POA: Diagnosis not present

## 2018-10-15 DIAGNOSIS — J9690 Respiratory failure, unspecified, unspecified whether with hypoxia or hypercapnia: Secondary | ICD-10-CM | POA: Diagnosis not present

## 2018-10-15 DIAGNOSIS — J449 Chronic obstructive pulmonary disease, unspecified: Secondary | ICD-10-CM | POA: Diagnosis present

## 2018-10-15 DIAGNOSIS — E1141 Type 2 diabetes mellitus with diabetic mononeuropathy: Secondary | ICD-10-CM | POA: Diagnosis present

## 2018-10-15 DIAGNOSIS — I2510 Atherosclerotic heart disease of native coronary artery without angina pectoris: Secondary | ICD-10-CM | POA: Diagnosis present

## 2018-10-15 DIAGNOSIS — F17210 Nicotine dependence, cigarettes, uncomplicated: Secondary | ICD-10-CM | POA: Diagnosis present

## 2018-10-15 DIAGNOSIS — Z6841 Body Mass Index (BMI) 40.0 and over, adult: Secondary | ICD-10-CM | POA: Diagnosis not present

## 2018-10-15 DIAGNOSIS — J9622 Acute and chronic respiratory failure with hypercapnia: Secondary | ICD-10-CM | POA: Diagnosis not present

## 2018-10-15 DIAGNOSIS — Z930 Tracheostomy status: Secondary | ICD-10-CM | POA: Diagnosis not present

## 2018-10-15 DIAGNOSIS — Z951 Presence of aortocoronary bypass graft: Secondary | ICD-10-CM | POA: Diagnosis not present

## 2018-10-15 DIAGNOSIS — Y838 Other surgical procedures as the cause of abnormal reaction of the patient, or of later complication, without mention of misadventure at the time of the procedure: Secondary | ICD-10-CM | POA: Diagnosis present

## 2018-10-15 DIAGNOSIS — D6862 Lupus anticoagulant syndrome: Secondary | ICD-10-CM | POA: Diagnosis not present

## 2018-10-15 DIAGNOSIS — E1110 Type 2 diabetes mellitus with ketoacidosis without coma: Secondary | ICD-10-CM | POA: Diagnosis not present

## 2018-10-15 DIAGNOSIS — I472 Ventricular tachycardia: Secondary | ICD-10-CM | POA: Diagnosis not present

## 2018-10-15 DIAGNOSIS — I248 Other forms of acute ischemic heart disease: Secondary | ICD-10-CM | POA: Diagnosis not present

## 2018-10-15 DIAGNOSIS — R7989 Other specified abnormal findings of blood chemistry: Secondary | ICD-10-CM | POA: Diagnosis not present

## 2018-10-15 DIAGNOSIS — I469 Cardiac arrest, cause unspecified: Secondary | ICD-10-CM | POA: Diagnosis not present

## 2018-10-15 DIAGNOSIS — Z4682 Encounter for fitting and adjustment of non-vascular catheter: Secondary | ICD-10-CM | POA: Diagnosis not present

## 2018-10-15 LAB — POCT I-STAT 7, (LYTES, BLD GAS, ICA,H+H)
ACID-BASE DEFICIT: 2 mmol/L (ref 0.0–2.0)
Acid-base deficit: 6 mmol/L — ABNORMAL HIGH (ref 0.0–2.0)
Acid-base deficit: 6 mmol/L — ABNORMAL HIGH (ref 0.0–2.0)
BICARBONATE: 21 mmol/L (ref 20.0–28.0)
Bicarbonate: 21.9 mmol/L (ref 20.0–28.0)
Bicarbonate: 22.4 mmol/L (ref 20.0–28.0)
Calcium, Ion: 1.07 mmol/L — ABNORMAL LOW (ref 1.15–1.40)
Calcium, Ion: 1.06 mmol/L — ABNORMAL LOW (ref 1.15–1.40)
Calcium, Ion: 0.93 mmol/L — ABNORMAL LOW (ref 1.15–1.40)
HCT: 42 % (ref 36.0–46.0)
HCT: 50 % — ABNORMAL HIGH (ref 36.0–46.0)
HCT: 33 % — ABNORMAL LOW (ref 36.0–46.0)
Hemoglobin: 14.3 g/dL (ref 12.0–15.0)
Hemoglobin: 17 g/dL — ABNORMAL HIGH (ref 12.0–15.0)
Hemoglobin: 11.2 g/dL — ABNORMAL LOW (ref 12.0–15.0)
O2 Saturation: 92 %
O2 Saturation: 88 %
O2 Saturation: 97 %
PH ART: 7.271 — AB (ref 7.350–7.450)
PH ART: 7.402 (ref 7.350–7.450)
Patient temperature: 98.6
Patient temperature: 98.5
Potassium: 2.7 mmol/L — CL (ref 3.5–5.1)
Potassium: 2.4 mmol/L — CL (ref 3.5–5.1)
Potassium: <2 mmol/L — CL (ref 3.5–5.1)
Sodium: 145 mmol/L (ref 135–145)
Sodium: 145 mmol/L (ref 135–145)
Sodium: 148 mmol/L — ABNORMAL HIGH (ref 135–145)
TCO2: 23 mmol/L (ref 22–32)
TCO2: 22 mmol/L (ref 22–32)
TCO2: 24 mmol/L (ref 22–32)
pCO2 arterial: 51.8 mmHg — ABNORMAL HIGH (ref 32.0–48.0)
pCO2 arterial: 45.5 mmHg (ref 32.0–48.0)
pCO2 arterial: 36.1 mmHg (ref 32.0–48.0)
pH, Arterial: 7.233 — ABNORMAL LOW (ref 7.350–7.450)
pO2, Arterial: 75 mmHg — ABNORMAL LOW (ref 83.0–108.0)
pO2, Arterial: 62 mmHg — ABNORMAL LOW (ref 83.0–108.0)
pO2, Arterial: 89 mmHg (ref 83.0–108.0)

## 2018-10-15 LAB — GLUCOSE, CAPILLARY
Glucose-Capillary: >600 mg/dL (ref 70–99)
Glucose-Capillary: >600 mg/dL (ref 70–99)
Glucose-Capillary: 595 mg/dL (ref 70–99)
Glucose-Capillary: 542 mg/dL (ref 70–99)
Glucose-Capillary: 468 mg/dL — ABNORMAL HIGH (ref 70–99)
Glucose-Capillary: 339 mg/dL — ABNORMAL HIGH (ref 70–99)
Glucose-Capillary: 326 mg/dL — ABNORMAL HIGH (ref 70–99)
Glucose-Capillary: 280 mg/dL — ABNORMAL HIGH (ref 70–99)
Glucose-Capillary: 214 mg/dL — ABNORMAL HIGH (ref 70–99)
Glucose-Capillary: 180 mg/dL — ABNORMAL HIGH (ref 70–99)
Glucose-Capillary: 160 mg/dL — ABNORMAL HIGH (ref 70–99)
Glucose-Capillary: 140 mg/dL — ABNORMAL HIGH (ref 70–99)
Glucose-Capillary: 159 mg/dL — ABNORMAL HIGH (ref 70–99)
Glucose-Capillary: 121 mg/dL — ABNORMAL HIGH (ref 70–99)
Glucose-Capillary: 115 mg/dL — ABNORMAL HIGH (ref 70–99)
Glucose-Capillary: 121 mg/dL — ABNORMAL HIGH (ref 70–99)
Glucose-Capillary: 119 mg/dL — ABNORMAL HIGH (ref 70–99)
Glucose-Capillary: 152 mg/dL — ABNORMAL HIGH (ref 70–99)

## 2018-10-15 LAB — CBC
HCT: 44.9 % (ref 36.0–46.0)
Hemoglobin: 14.7 g/dL (ref 12.0–15.0)
MCH: 31.9 pg (ref 26.0–34.0)
MCHC: 32.7 g/dL (ref 30.0–36.0)
MCV: 97.4 fL (ref 80.0–100.0)
Platelets: 265 10*3/uL (ref 150–400)
RBC: 4.61 MIL/uL (ref 3.87–5.11)
RDW: 13.5 % (ref 11.5–15.5)
WBC: 26.5 10*3/uL — ABNORMAL HIGH (ref 4.0–10.5)
nRBC: 0 % (ref 0.0–0.2)

## 2018-10-15 LAB — MAGNESIUM
Magnesium: 2.7 mg/dL — ABNORMAL HIGH (ref 1.7–2.4)
Magnesium: 1.8 mg/dL (ref 1.7–2.4)

## 2018-10-15 LAB — BASIC METABOLIC PANEL
Anion gap: 25 — ABNORMAL HIGH (ref 5–15)
Anion gap: 21 — ABNORMAL HIGH (ref 5–15)
Anion gap: 12 (ref 5–15)
Anion gap: 12 (ref 5–15)
BUN: 14 mg/dL (ref 6–20)
BUN: 14 mg/dL (ref 6–20)
BUN: 15 mg/dL (ref 6–20)
BUN: 14 mg/dL (ref 6–20)
CALCIUM: 7.1 mg/dL — AB (ref 8.9–10.3)
CO2: 19 mmol/L — ABNORMAL LOW (ref 22–32)
CO2: 27 mmol/L (ref 22–32)
CO2: 28 mmol/L (ref 22–32)
CO2: 25 mmol/L (ref 22–32)
Calcium: 8.2 mg/dL — ABNORMAL LOW (ref 8.9–10.3)
Calcium: 8.3 mg/dL — ABNORMAL LOW (ref 8.9–10.3)
Calcium: 7.6 mg/dL — ABNORMAL LOW (ref 8.9–10.3)
Chloride: 102 mmol/L (ref 98–111)
Chloride: 98 mmol/L (ref 98–111)
Chloride: 103 mmol/L (ref 98–111)
Chloride: 97 mmol/L — ABNORMAL LOW (ref 98–111)
Creatinine, Ser: 1.54 mg/dL — ABNORMAL HIGH (ref 0.44–1.00)
Creatinine, Ser: 1.42 mg/dL — ABNORMAL HIGH (ref 0.44–1.00)
Creatinine, Ser: 1.05 mg/dL — ABNORMAL HIGH (ref 0.44–1.00)
Creatinine, Ser: 0.94 mg/dL (ref 0.44–1.00)
GFR calc Af Amer: 45 mL/min — ABNORMAL LOW (ref >60)
GFR calc Af Amer: 49 mL/min — ABNORMAL LOW (ref >60)
GFR calc Af Amer: >60 mL/min (ref >60)
GFR calc Af Amer: >60 mL/min (ref >60)
GFR calc non Af Amer: 42 mL/min — ABNORMAL LOW (ref >60)
GFR calc non Af Amer: >60 mL/min (ref >60)
GFR calc non Af Amer: >60 mL/min (ref >60)
GFR, EST NON AFRICAN AMERICAN: 38 mL/min — AB (ref >60)
GLUCOSE: 357 mg/dL — AB (ref 70–99)
Glucose, Bld: 768 mg/dL (ref 70–99)
Glucose, Bld: 144 mg/dL — ABNORMAL HIGH (ref 70–99)
Glucose, Bld: 154 mg/dL — ABNORMAL HIGH (ref 70–99)
Potassium: 2.5 mmol/L — CL (ref 3.5–5.1)
Potassium: 2.3 mmol/L — CL (ref 3.5–5.1)
Potassium: 3.5 mmol/L (ref 3.5–5.1)
Potassium: 3.6 mmol/L (ref 3.5–5.1)
Sodium: 146 mmol/L — ABNORMAL HIGH (ref 135–145)
Sodium: 146 mmol/L — ABNORMAL HIGH (ref 135–145)
Sodium: 143 mmol/L (ref 135–145)
Sodium: 134 mmol/L — ABNORMAL LOW (ref 135–145)

## 2018-10-15 LAB — POCT I-STAT EG7
Acid-base deficit: 9 mmol/L — ABNORMAL HIGH (ref 0.0–2.0)
Acid-base deficit: 5 mmol/L — ABNORMAL HIGH (ref 0.0–2.0)
Bicarbonate: 23.3 mmol/L (ref 20.0–28.0)
Bicarbonate: 21.2 mmol/L (ref 20.0–28.0)
CALCIUM ION: 1.27 mmol/L (ref 1.15–1.40)
Calcium, Ion: 1.07 mmol/L — ABNORMAL LOW (ref 1.15–1.40)
HCT: 41 % (ref 36.0–46.0)
HEMATOCRIT: 44 % (ref 36.0–46.0)
Hemoglobin: 15 g/dL (ref 12.0–15.0)
Hemoglobin: 13.9 g/dL (ref 12.0–15.0)
O2 Saturation: 38 %
O2 Saturation: 85 %
POTASSIUM: 3.3 mmol/L — AB (ref 3.5–5.1)
Patient temperature: 98.6
Patient temperature: 98.5
Potassium: 2.6 mmol/L — CL (ref 3.5–5.1)
Sodium: 141 mmol/L (ref 135–145)
Sodium: 144 mmol/L (ref 135–145)
TCO2: 26 mmol/L (ref 22–32)
TCO2: 22 mmol/L (ref 22–32)
pCO2, Ven: 84 mmHg (ref 44.0–60.0)
pCO2, Ven: 41.8 mmHg — ABNORMAL LOW (ref 44.0–60.0)
pH, Ven: 7.051 — CL (ref 7.250–7.430)
pH, Ven: 7.313 (ref 7.250–7.430)
pO2, Ven: 33 mmHg (ref 32.0–45.0)
pO2, Ven: 54 mmHg — ABNORMAL HIGH (ref 32.0–45.0)

## 2018-10-15 LAB — MRSA PCR SCREENING: MRSA by PCR: NEGATIVE

## 2018-10-15 LAB — PROTIME-INR
INR: 3.66
Prothrombin Time: 35.9 seconds — ABNORMAL HIGH (ref 11.4–15.2)

## 2018-10-15 LAB — HIV ANTIBODY (ROUTINE TESTING W REFLEX): HIV Screen 4th Generation wRfx: NONREACTIVE

## 2018-10-15 LAB — PHOSPHORUS
Phosphorus: 2.2 mg/dL — ABNORMAL LOW (ref 2.5–4.6)
Phosphorus: <1 mg/dL — CL (ref 2.5–4.6)
Phosphorus: 2.5 mg/dL (ref 2.5–4.6)

## 2018-10-15 LAB — LACTIC ACID, PLASMA
Lactic Acid, Venous: >11 mmol/L (ref 0.5–1.9)
Lactic Acid, Venous: 5.3 mmol/L (ref 0.5–1.9)
Lactic Acid, Venous: 3.3 mmol/L (ref 0.5–1.9)

## 2018-10-15 LAB — URINALYSIS, ROUTINE W REFLEX MICROSCOPIC
Bilirubin Urine: NEGATIVE
Glucose, UA: >=500 mg/dL — AB
Ketones, ur: NEGATIVE mg/dL
Leukocytes, UA: NEGATIVE
Nitrite: NEGATIVE
Protein, ur: 30 mg/dL — AB
Specific Gravity, Urine: 1.011 (ref 1.005–1.030)
pH: 5 (ref 5.0–8.0)

## 2018-10-15 LAB — INFLUENZA PANEL BY PCR (TYPE A & B)
Influenza A By PCR: NEGATIVE
Influenza B By PCR: NEGATIVE

## 2018-10-15 LAB — CBG MONITORING, ED: Glucose-Capillary: >600 mg/dL (ref 70–99)

## 2018-10-15 LAB — BETA-HYDROXYBUTYRIC ACID: Beta-Hydroxybutyric Acid: 0.28 mmol/L — ABNORMAL HIGH (ref 0.05–0.27)

## 2018-10-15 MED ORDER — SODIUM BICARBONATE 8.4 % IV SOLN
INTRAVENOUS | Status: AC
  Filled 2018-10-15: qty 150

## 2018-10-15 MED ORDER — STERILE WATER FOR INJECTION IV SOLN
INTRAVENOUS | Status: DC
  Administered 2018-10-15 – 2018-10-16 (×3): via INTRAVENOUS
  Filled 2018-10-15 (×5): qty 850

## 2018-10-15 MED ORDER — POTASSIUM PHOSPHATES 15 MMOLE/5ML IV SOLN
30.0000 mmol | Freq: Once | INTRAVENOUS | Status: AC
  Administered 2018-10-15: 30 mmol via INTRAVENOUS
  Filled 2018-10-15: qty 10

## 2018-10-15 MED ORDER — INSULIN REGULAR(HUMAN) IN NACL 100-0.9 UT/100ML-% IV SOLN
INTRAVENOUS | Status: DC
  Administered 2018-10-15: 5.4 [IU]/h via INTRAVENOUS
  Administered 2018-10-15: 16 [IU]/h via INTRAVENOUS
  Administered 2018-10-15: 3.9 [IU]/h via INTRAVENOUS
  Filled 2018-10-15 (×5): qty 100

## 2018-10-15 MED ORDER — LACTATED RINGERS IV BOLUS
1000.0000 mL | Freq: Once | INTRAVENOUS | Status: AC
  Administered 2018-10-15: 1000 mL via INTRAVENOUS

## 2018-10-15 MED ORDER — LACTATED RINGERS IV SOLN
INTRAVENOUS | Status: DC
  Administered 2018-10-15 – 2018-10-16 (×3): via INTRAVENOUS

## 2018-10-15 MED ORDER — NOREPINEPHRINE 4 MG/250ML-% IV SOLN
INTRAVENOUS | Status: AC
  Administered 2018-10-15: 30 ug/min
  Filled 2018-10-15: qty 250

## 2018-10-15 MED ORDER — PROPOFOL 1000 MG/100ML IV EMUL
INTRAVENOUS | Status: AC
  Filled 2018-10-15: qty 100

## 2018-10-15 MED ORDER — INSULIN GLARGINE 100 UNIT/ML ~~LOC~~ SOLN
20.0000 [IU] | SUBCUTANEOUS | Status: DC
  Administered 2018-10-15 – 2018-10-18 (×4): 20 [IU] via SUBCUTANEOUS
  Filled 2018-10-15 (×7): qty 0.2

## 2018-10-15 MED ORDER — SODIUM CHLORIDE 0.9 % IV SOLN: INTRAVENOUS | Status: DC

## 2018-10-15 MED ORDER — MIDAZOLAM HCL 2 MG/2ML IJ SOLN
2.0000 mg | INTRAMUSCULAR | Status: DC | PRN
  Administered 2018-10-15: 2 mg via INTRAVENOUS

## 2018-10-15 MED ORDER — MIDAZOLAM HCL 2 MG/2ML IJ SOLN
INTRAMUSCULAR | Status: AC
  Administered 2018-10-15: 2 mg
  Filled 2018-10-15: qty 2

## 2018-10-15 MED ORDER — MIDAZOLAM HCL 2 MG/2ML IJ SOLN
INTRAMUSCULAR | Status: AC
  Administered 2018-10-15: 2 mg via INTRAVENOUS
  Filled 2018-10-15: qty 2

## 2018-10-15 MED ORDER — VANCOMYCIN HCL 10 G IV SOLR
2000.0000 mg | INTRAVENOUS | Status: AC
  Administered 2018-10-15: 2000 mg via INTRAVENOUS
  Filled 2018-10-15: qty 2000

## 2018-10-15 MED ORDER — ROCURONIUM BROMIDE 50 MG/5ML IV SOLN
50.0000 mg | Freq: Once | INTRAVENOUS | Status: AC
  Administered 2018-10-15: 50 mg via INTRAVENOUS

## 2018-10-15 MED ORDER — FENTANYL CITRATE (PF) 100 MCG/2ML IJ SOLN
50.0000 ug | Freq: Once | INTRAMUSCULAR | Status: AC
  Administered 2018-10-15: 50 ug via INTRAVENOUS

## 2018-10-15 MED ORDER — VASOPRESSIN 20 UNIT/ML IV SOLN
0.0300 [IU]/min | INTRAVENOUS | Status: DC
  Administered 2018-10-15 – 2018-10-16 (×2): 0.03 [IU]/min via INTRAVENOUS
  Filled 2018-10-15 (×3): qty 2

## 2018-10-15 MED ORDER — ALBUTEROL SULFATE (2.5 MG/3ML) 0.083% IN NEBU
2.5000 mg | INHALATION_SOLUTION | RESPIRATORY_TRACT | Status: DC
  Administered 2018-10-15: 2.5 mg via RESPIRATORY_TRACT
  Filled 2018-10-15: qty 3

## 2018-10-15 MED ORDER — POTASSIUM CHLORIDE 10 MEQ/50ML IV SOLN
10.0000 meq | INTRAVENOUS | Status: AC
  Administered 2018-10-15 – 2018-10-16 (×4): 10 meq via INTRAVENOUS
  Filled 2018-10-15 (×4): qty 50

## 2018-10-15 MED ORDER — ORAL CARE MOUTH RINSE
15.0000 mL | OROMUCOSAL | Status: DC
  Administered 2018-10-15 – 2018-10-19 (×44): 15 mL via OROMUCOSAL

## 2018-10-15 MED ORDER — POTASSIUM CHLORIDE 10 MEQ/100ML IV SOLN
10.0000 meq | INTRAVENOUS | Status: AC
  Administered 2018-10-15 (×5): 10 meq via INTRAVENOUS
  Filled 2018-10-15 (×2): qty 100

## 2018-10-15 MED ORDER — SODIUM CHLORIDE 0.9 % IV SOLN
INTRAVENOUS | Status: AC
  Administered 2018-10-15: 05:00:00 via INTRAVENOUS

## 2018-10-15 MED ORDER — FUROSEMIDE 10 MG/ML IJ SOLN
60.0000 mg | Freq: Once | INTRAMUSCULAR | Status: AC
  Administered 2018-10-15: 60 mg via INTRAVENOUS
  Filled 2018-10-15: qty 6

## 2018-10-15 MED ORDER — VANCOMYCIN HCL 500 MG IV SOLR
500.0000 mg | Freq: Two times a day (BID) | INTRAVENOUS | Status: DC
  Administered 2018-10-15: 500 mg via INTRAVENOUS
  Filled 2018-10-15 (×2): qty 500

## 2018-10-15 MED ORDER — FAMOTIDINE IN NACL 20-0.9 MG/50ML-% IV SOLN
20.0000 mg | INTRAVENOUS | Status: DC
  Administered 2018-10-15 – 2018-10-16 (×2): 20 mg via INTRAVENOUS
  Filled 2018-10-15 (×2): qty 50

## 2018-10-15 MED ORDER — ETOMIDATE 2 MG/ML IV SOLN
30.0000 mg | Freq: Once | INTRAVENOUS | Status: AC
  Administered 2018-10-15: 30 mg via INTRAVENOUS

## 2018-10-15 MED ORDER — FENTANYL 2500MCG IN NS 250ML (10MCG/ML) PREMIX INFUSION
25.0000 ug/h | INTRAVENOUS | Status: DC
  Administered 2018-10-15: 50 ug/h via INTRAVENOUS
  Administered 2018-10-15: 100 ug/h via INTRAVENOUS
  Administered 2018-10-16: 125 ug/h via INTRAVENOUS
  Administered 2018-10-17: 50 ug/h via INTRAVENOUS
  Filled 2018-10-15 (×3): qty 250

## 2018-10-15 MED ORDER — CALCIUM GLUCONATE-NACL 1-0.675 GM/50ML-% IV SOLN
1.0000 g | Freq: Once | INTRAVENOUS | Status: AC
  Administered 2018-10-15: 1000 mg via INTRAVENOUS
  Filled 2018-10-15: qty 50

## 2018-10-15 MED ORDER — ETOMIDATE 2 MG/ML IV SOLN
20.0000 mg | Freq: Once | INTRAVENOUS | Status: AC
  Administered 2018-10-15: 20 mg via INTRAVENOUS

## 2018-10-15 MED ORDER — INSULIN ASPART 100 UNIT/ML ~~LOC~~ SOLN
0.0000 [IU] | SUBCUTANEOUS | Status: DC
  Administered 2018-10-16 (×2): 5 [IU] via SUBCUTANEOUS
  Administered 2018-10-16 – 2018-10-17 (×4): 3 [IU] via SUBCUTANEOUS
  Administered 2018-10-17: 5 [IU] via SUBCUTANEOUS
  Administered 2018-10-17: 3 [IU] via SUBCUTANEOUS
  Administered 2018-10-17: 5 [IU] via SUBCUTANEOUS
  Administered 2018-10-17 (×2): 3 [IU] via SUBCUTANEOUS
  Administered 2018-10-18: 5 [IU] via SUBCUTANEOUS
  Administered 2018-10-18 (×2): 8 [IU] via SUBCUTANEOUS

## 2018-10-15 MED ORDER — POTASSIUM CHLORIDE 10 MEQ/50ML IV SOLN
10.0000 meq | INTRAVENOUS | Status: AC
  Administered 2018-10-15 (×5): 10 meq via INTRAVENOUS
  Filled 2018-10-15 (×5): qty 50

## 2018-10-15 MED ORDER — FENTANYL CITRATE (PF) 100 MCG/2ML IJ SOLN
INTRAMUSCULAR | Status: AC
  Administered 2018-10-15: 50 ug
  Filled 2018-10-15: qty 2

## 2018-10-15 MED ORDER — DEXTROSE-NACL 5-0.45 % IV SOLN
INTRAVENOUS | Status: DC
  Administered 2018-10-15: 13:00:00 via INTRAVENOUS

## 2018-10-15 MED ORDER — FENTANYL BOLUS VIA INFUSION
50.0000 ug | INTRAVENOUS | Status: DC | PRN
  Filled 2018-10-15: qty 50

## 2018-10-15 MED ORDER — CHLORHEXIDINE GLUCONATE 0.12% ORAL RINSE (MEDLINE KIT)
15.0000 mL | Freq: Two times a day (BID) | OROMUCOSAL | Status: DC
  Administered 2018-10-15 – 2018-10-19 (×10): 15 mL via OROMUCOSAL

## 2018-10-15 MED ORDER — NOREPINEPHRINE 4 MG/250ML-% IV SOLN
0.0000 ug/min | INTRAVENOUS | Status: DC
  Administered 2018-10-15 (×2): 16 ug/min via INTRAVENOUS
  Administered 2018-10-15: 5 ug/min via INTRAVENOUS
  Administered 2018-10-16 (×2): 16 ug/min via INTRAVENOUS
  Administered 2018-10-16: 5 ug/min via INTRAVENOUS
  Filled 2018-10-15 (×6): qty 250

## 2018-10-15 MED ORDER — FENTANYL CITRATE (PF) 100 MCG/2ML IJ SOLN
INTRAMUSCULAR | Status: AC
  Administered 2018-10-15: 100 ug
  Filled 2018-10-15: qty 2

## 2018-10-15 MED ORDER — ALBUTEROL SULFATE (2.5 MG/3ML) 0.083% IN NEBU
2.5000 mg | INHALATION_SOLUTION | Freq: Two times a day (BID) | RESPIRATORY_TRACT | Status: DC
  Administered 2018-10-15 – 2018-10-19 (×9): 2.5 mg via RESPIRATORY_TRACT
  Filled 2018-10-15 (×9): qty 3

## 2018-10-15 MED ORDER — LIDOCAINE HCL 1 % IJ SOLN
10.0000 mL | INTRAMUSCULAR | Status: AC
  Filled 2018-10-15: qty 10

## 2018-10-15 MED ORDER — POTASSIUM CHLORIDE 10 MEQ/100ML IV SOLN: 10.0000 meq | INTRAVENOUS | Status: AC

## 2018-10-15 MED ORDER — EPINEPHRINE PF 1 MG/ML IJ SOLN
0.5000 ug/min | INTRAVENOUS | Status: DC
  Administered 2018-10-15 (×2): 20 ug/min via INTRAVENOUS
  Filled 2018-10-15 (×5): qty 4

## 2018-10-15 MED ORDER — MIDAZOLAM HCL 2 MG/2ML IJ SOLN
2.0000 mg | INTRAMUSCULAR | Status: DC | PRN
  Administered 2018-10-15 – 2018-10-16 (×3): 2 mg via INTRAVENOUS
  Filled 2018-10-15 (×3): qty 2

## 2018-10-15 MED ORDER — DOCUSATE SODIUM 50 MG/5ML PO LIQD
100.0000 mg | Freq: Two times a day (BID) | ORAL | Status: DC | PRN
  Administered 2018-10-17: 100 mg
  Filled 2018-10-15: qty 10

## 2018-10-15 MED ORDER — PANTOPRAZOLE SODIUM 40 MG IV SOLR
40.0000 mg | INTRAVENOUS | Status: DC
  Filled 2018-10-15: qty 40

## 2018-10-15 MED ORDER — VANCOMYCIN HCL IN DEXTROSE 1-5 GM/200ML-% IV SOLN
1000.0000 mg | INTRAVENOUS | Status: DC
  Filled 2018-10-15: qty 200

## 2018-10-15 MED ORDER — HEPARIN SODIUM (PORCINE) 5000 UNIT/ML IJ SOLN
5000.0000 [IU] | Freq: Three times a day (TID) | INTRAMUSCULAR | Status: DC
  Administered 2018-10-15 – 2018-10-16 (×3): 5000 [IU] via SUBCUTANEOUS
  Filled 2018-10-15 (×3): qty 1

## 2018-10-15 MED ORDER — LIDOCAINE HCL (PF) 1 % IJ SOLN
INTRAMUSCULAR | Status: AC
  Filled 2018-10-15: qty 30

## 2018-10-15 MED ORDER — VANCOMYCIN HCL IN DEXTROSE 1-5 GM/200ML-% IV SOLN: 1000.0000 mg | INTRAVENOUS | Status: DC

## 2018-10-15 NOTE — ED Notes (Signed)
Critical care at the bedside. 

## 2018-10-15 NOTE — Progress Notes (Signed)
CRITICAL VALUE ALERT  Critical Value:  k 2.3, phos <1  Date & Time Notied:  10/15/18 1230  Provider Notified: [place]  Orders Received/Actions taken: see new orders

## 2018-10-15 NOTE — ED Provider Notes (Signed)
[place] EMERGENCY DEPARTMENT Provider Note   CSN: [id number] Arrival date & time: 10/14/18  2236     History   Chief Complaint Chief Complaint  Patient presents with  . Shortness of Breath    HPI Theresa [name] is a 53 y.o. female.   Patient presents in respiratory distress. Level 5 caveat secondary to acuity of condition. Per EMS the patient was at home having care performed to her tracheostomy, when she became dyspneic, developed chest pain. Symptoms persisted, requiring EMS transport. Patient nods to answer some questions, indicates that she continues to have difficulty breathing, pain.  Evaluation, resuscitation performed with nursing, respiratory therapy staff.   Past Medical History:  Diagnosis Date  . CAD (coronary artery disease)    a. s/p prior PCI. b. CABG 2007 at [facility] in [place] 2007. c. inferior STEMI 10/2015 s/p DES to dSVG-PDA.  Marland Kitchen Cervical cancer ([place])   . Chronic diastolic CHF (congestive heart failure) ([place])   . Chronic respiratory failure ([place])    s/p tracheostomy 2002  . Chronic RUQ pain   . COPD (chronic obstructive pulmonary disease) ([place])   . Diabetes mellitus ([place])   . DVT (deep venous thrombosis) ([place])   . Endometriosis   . History of gallstones 01/2016   seen on Ultrasound  . History of HIDA scan 11/2016   normal  . HTN (hypertension)   . Hyperlipidemia   . Lupus anticoagulant disorder (HCC)    on coumadin  . Morbid obesity ([place])   . ST elevation (STEMI) myocardial infarction involving right coronary artery ([place]) 10/29/15   stent to VG to PDA  . Toe fracture, right 03/29/2018  . Tracheostomy in place [facility]), chronic since 2002 11/03/2015    Patient Active Problem List   Diagnosis Date Noted  . Medication management 04/20/2018  . Toe fracture, left 5th phalangeal phalanx 03/29/2018  . Superficial femoral artery injury 03/24/2018  . Pulmonary hypertension, unspecified ([place])   . Unstable angina ([place])  03/22/2018  . Acute on chronic respiratory failure with hypoxia ([place]) 08/01/2017  . Uncontrolled type 2 diabetes mellitus with hyperglycemia, with long-term current use of insulin ([place]) 08/01/2017  . Acute on chronic diastolic CHF (congestive heart failure) ([place]) 08/01/2017  . Dyspnea 07/31/2017  . Diastolic heart failure ([place]) 07/31/2017  . RUQ pain 12/21/2016  . Nausea & vomiting   . CHF (congestive heart failure) ([place])   . Acute respiratory failure with hypoxia ([place]) 01/22/2016  . Type 2 diabetes mellitus with hyperglycemia ([place]) 01/22/2016  . CAP (community acquired pneumonia) 01/22/2016  . Tracheostomy status ([place])   . CAD (coronary artery disease) of artery bypass graft: PTCA/DES to distal body VG to PDA 10/29/15 11/03/2015  . Acute respiratory failure with hypoxemia ([place]) 11/03/2015  . Tracheostomy in place [facility]), chronic since 2002 11/03/2015  . Hypokalemia 11/03/2015  . Lupus anticoagulant syndrome ([place])   . Chronic diastolic CHF (congestive heart failure) ([place])   . Respiratory failure ([place])   . Coronary artery disease involving coronary bypass graft of native heart without angina pectoris   . OSA (obstructive sleep apnea)   . COPD exacerbation ([place])   . ST elevation myocardial infarction (STEMI) of inferior wall ([place]) 10/29/2015  . Acute ST elevation myocardial infarction (STEMI) involving right coronary artery ([place])   . Tobacco abuse   . DM (diabetes mellitus) ([place]) 06/11/2009  . Hyperlipidemia LDL goal <70 06/11/2009  . DVT 06/11/2009  . CORONARY ARTERY BYPASS GRAFT, HX OF 06/11/2009  .  Essential hypertension 05/27/2009  . CAD, NATIVE VESSEL 05/27/2009    Past Surgical History:  Procedure Laterality Date  . CARDIAC CATHETERIZATION N/A 10/29/2015   Procedure: Left Heart Cath and Cors/Grafts Angiography;  Surgeon: Burnell Blanks, MD;  Location: [place] CV LAB;  Service: Cardiovascular;  Laterality: N/A;  . CARDIAC CATHETERIZATION  10/29/2015   Procedure:  Coronary Stent Intervention;  Surgeon: Burnell Blanks, MD;  Location: [place] CV LAB;  Service: Cardiovascular;;  . CAROTID STENT    . CESAREAN SECTION WITH BILATERAL TUBAL LIGATION    . CORONARY ARTERY BYPASS GRAFT  2007   2V  . RIGHT/LEFT HEART CATH AND CORONARY/GRAFT ANGIOGRAPHY N/A 03/24/2018   Procedure: RIGHT/LEFT HEART CATH AND CORONARY/GRAFT ANGIOGRAPHY;  Surgeon: Belva Crome, MD;  Location: [place] CV LAB;  Service: Cardiovascular;  Laterality: N/A;  . TRACHEOSTOMY       OB History   No obstetric history on file.      Home Medications    Prior to Admission medications   Medication Sig Start Date End Date Taking? Authorizing Provider  albuterol (PROVENTIL HFA;VENTOLIN HFA) 108 (90 Base) MCG/ACT inhaler Inhale 1-2 puffs into the lungs every 6 (six) hours as needed for wheezing or shortness of breath.    [provider]  albuterol (PROVENTIL) (2.5 MG/3ML) 0.083% nebulizer solution Take 3 mLs (2.5 mg total) by nebulization every 4 (four) hours as needed for wheezing or shortness of breath. 11/17/15   Donita Brooks, NP  clopidogrel (PLAVIX) 75 MG tablet TAKE 1 TABLET BY MOUTH ONCE DAILY *NEED OFFICE VISIT* 05/30/18   [name], MD  furosemide (LASIX) 40 MG tablet Take 1 tablet (40 mg total) by mouth 2 (two) times daily. 08/04/17   Eber Jones, MD  gabapentin (NEURONTIN) 300 MG capsule Take 300-600 mg by mouth 2 (two) times daily. 12/31/15   [provider]  insulin degludec (TRESIBA FLEXTOUCH) 100 UNIT/ML SOPN FlexTouch Pen Inject 25 Units into the skin daily at 10 pm.    [provider]  isosorbide mononitrate (IMDUR) 30 MG 24 hr tablet Take 1 tablet (30 mg total) by mouth daily. 05/25/18 08/23/18  Strader, Fransisco Hertz, PA-C  lisinopril (PRINIVIL,ZESTRIL) 20 MG tablet Take 1 tablet (20 mg total) by mouth 2 (two) times daily. 04/20/18   [name], MD  metoprolol (LOPRESSOR) 50 MG tablet Take 50 mg by mouth 2 (two) times  daily.    [provider]  nitroGLYCERIN (NITROSTAT) 0.4 MG SL tablet Place 0.4 mg under the tongue every 5 (five) minutes as needed for chest pain.    [provider]  pantoprazole (PROTONIX) 40 MG tablet Take 40 mg by mouth daily.    [provider]  rosuvastatin (CRESTOR) 10 MG tablet Take 1 tablet (10 mg total) by mouth daily. 03/30/18   Isaiah Serge, NP  warfarin (COUMADIN) 5 MG tablet Take 2.5-5 mg by mouth See admin instructions. 5mg  on Monday and Friday; 2.5 mg rest of the days    [provider]    Family History Family History  Problem Relation Age of Onset  . Hypertension Mother   . Diabetes Mother   . Hypertension Father   . Diabetes Father     Social History Social History   Tobacco Use  . Smoking status: Current Every Day Smoker    Packs/day: 0.75    Types: Cigarettes    Start date: 10/23/1978  . Smokeless tobacco: Never Used  . Tobacco comment: 1/2 pack -  trying to cut back   Substance Use Topics  . Alcohol use: No    Alcohol/week: 0.0 standard drinks  . Drug use: No     Allergies   Penicillins; Ciprofloxacin; and Ibuprofen   Review of Systems Review of Systems  Unable to perform ROS: Acuity of condition     Physical Exam Updated Vital Signs BP (!) 152/111   Pulse (!) 140   Temp (!) 97.4 F (36.3 C)   Resp (!) 30   SpO2 (!) 89%   Physical Exam Vitals signs and nursing note reviewed.  Constitutional:      General: She is in acute distress.     Appearance: She is well-developed. She is ill-appearing and diaphoretic.     Comments: Obese elderly appearing female with tracheostomy with distress evident.  HENT:     Head: Normocephalic and atraumatic.  Eyes:     Conjunctiva/sclera: Conjunctivae normal.  Neck:     Comments: Tracheostomy unremarkable gross appearance externally Cardiovascular:     Rate and Rhythm: Regular rhythm. Tachycardia present.  Pulmonary:     Effort: Tachypnea, accessory muscle usage  and respiratory distress present.     Breath sounds: Decreased breath sounds and wheezing present.  Abdominal:     General: There is no distension.  Musculoskeletal:     Right lower leg: Edema present.     Left lower leg: Edema present.  Skin:    General: Skin is warm.  Neurological:     General: No focal deficit present.     Mental Status: She is alert.     Cranial Nerves: No cranial nerve deficit.  Psychiatric:        Mood and Affect: Mood is anxious.      ED Treatments / Results  Labs (all labs ordered are listed, but only abnormal results are displayed) Labs Reviewed  COMPREHENSIVE METABOLIC PANEL - Abnormal; Notable for the following components:      Result Value   Sodium 133 (*)    Chloride 96 (*)    CO2 17 (*)    Glucose, Bld 650 (*)    Calcium 8.8 (*)    Alkaline Phosphatase 133 (*)    Anion gap 20 (*)    All other components within normal limits  CBC WITH DIFFERENTIAL/PLATELET - Abnormal; Notable for the following components:   WBC 13.4 (*)    RBC 5.71 (*)    Hemoglobin 18.3 (*)    HCT 55.9 (*)    Neutro Abs 8.1 (*)    Lymphs Abs 4.4 (*)    All other components within normal limits  BRAIN NATRIURETIC PEPTIDE - Abnormal; Notable for the following components:   B Natriuretic Peptide 396.0 (*)    All other components within normal limits  POCT I-STAT 7, (LYTES, BLD GAS, ICA,H+H) - Abnormal; Notable for the following components:   pH, Arterial 7.286 (*)    pO2, Arterial 66.0 (*)    Acid-base deficit 5.0 (*)    Sodium 132 (*)    Calcium, Ion 1.13 (*)    HCT 51.0 (*)    Hemoglobin 17.3 (*)    All other components within normal limits  CULTURE, BLOOD (ROUTINE X 2)  CULTURE, BLOOD (ROUTINE X 2)  TROPONIN I    EKG EKG Interpretation  Date/Time:  Saturday October 14 2018 22:40:39 EST Ventricular Rate:  165 PR Interval:    QRS Duration: 104 QT Interval:  300 QTC Calculation: 497 R Axis:   120 Text Interpretation:  Sinus  tachycardia LAE, consider  biatrial enlargement Low voltage with right axis deviation Repolarization abnormality, prob rate related ST-t wave abnormality Abnormal ekg Confirmed by Carmin Muskrat [phone]) on 10/14/2018 11:28:48 PM   Radiology Dg Chest Port 1 View  Result Date: 10/14/2018 CLINICAL DATA:  Respiratory distress EXAM: PORTABLE CHEST 1 VIEW COMPARISON:  03/22/2018 chest radiograph. FINDINGS: Tracheostomy tube terminates over the tracheal air column at the level of the thoracic inlet. Intact sternotomy wires. Stable cardiomediastinal silhouette with mild cardiomegaly. No pneumothorax. No pleural effusion. Diffuse fluffy opacity throughout both lungs, worsened. IMPRESSION: Stable mild cardiomegaly. Diffuse fluffy opacity throughout both lungs, favor moderate to severe pulmonary edema due to congestive heart failure. Electronically Signed   By: Ilona Sorrel M.D.   On: 10/14/2018 23:07    Procedures Procedures (including critical care time)  Medications Ordered in ED Medications  dextrose 5 %-0.45 % sodium chloride infusion (has no administration in time range)  insulin regular, human (MYXREDLIN) 100 units/ 100 mL infusion (has no administration in time range)  aztreonam (AZACTAM) 2 g in sodium chloride 0.9 % 100 mL IVPB (has no administration in time range)  0.9 %  sodium chloride infusion ( Intravenous New Bag/Given 10/15/18 0000)  vancomycin (VANCOCIN) 2,000 mg in sodium chloride 0.9 % 500 mL IVPB (has no administration in time range)  vancomycin (VANCOCIN) IVPB 1000 mg/200 mL premix (has no administration in time range)  albuterol (PROVENTIL) (2.5 MG/3ML) 0.083% nebulizer solution 5 mg (5 mg Nebulization Given 10/14/18 2248)  ipratropium (ATROVENT) nebulizer solution 0.5 mg (0.5 mg Nebulization Given 10/14/18 2247)     Initial Impression / Assessment and Plan / ED Course  I have reviewed the triage vital signs and the nursing notes.  Pertinent labs & imaging results that were available during my care of the  patient were reviewed by me and considered in my medical decision making (see chart for details).    Arrival, with concern for respiratory distress, hypoxia, with saturation less than 80%, tachypnea, tachycardia, patient had suctioning, irrigation, flushing of her tracheostomy, perform a respiratory therapy, this resulted in some improved respiratory status, though only with multiple breathing treatments, continued suctioning the patient have saturation in the 90% range. Initial EKG concerning for substantial tachycardia, with ischemic changes, though the patient's initial troponin was normal.  Initial labs notable for hyperglycemia, 650, with anion gap of 20. Patient also found to have evidence for dehydration, with elevated hemoglobin, hematocrit, suspicion of constant hemoconcentration However, the patient does have a history of congestive heart failure, and with BNP similar to prior studies, fluids will be provided judiciously, while she is starting an insulin drip. X-ray concerning for interstitial findings, and as above, with history of heart failure this is a consideration, however, without other substantial edema, and with after mentioned evidence for DKA, there are some suspicion for concurrent infection. Patient began broad-spectrum antibiotics in addition to her insulin drip, steroids, breathing treatments. Given her substantial illness, I discussed the patient's case with our critical care colleagues for admission to the ICU for further monitoring, management. Notably, other additional labs notable for troponin value of 0. Give the patient description of chest pain, tachycardia, troponin values may be trended, but this is somewhat reassuring given her initial other critical abnormalities.   Final Clinical Impressions(s) / ED Diagnoses  Respiratory distress DKA  CRITICAL CARE Performed by: Carmin Muskrat Total critical care time: 40 minutes Critical care time was exclusive of  separately billable procedures and treating other patients. Critical care was necessary  to treat or prevent imminent or life-threatening deterioration. Critical care was time spent personally by me on the following activities: development of treatment plan with patient and/or surrogate as well as nursing, discussions with consultants, evaluation of patient's response to treatment, examination of patient, obtaining history from patient or surrogate, ordering and performing treatments and interventions, ordering and review of laboratory studies, ordering and review of radiographic studies, pulse oximetry and re-evaluation of patient's condition.    Carmin Muskrat, MD 10/15/18 [phone]

## 2018-10-15 NOTE — Progress Notes (Signed)
   10/15/18 0300  Clinical Encounter Type  Visited With Patient  Visit Type Initial  Referral From Nurse  Consult/Referral To Chaplain  Spiritual Encounters  Spiritual Needs Emotional;Prayer  Stress Factors  Patient Stress Factors Other (Comment)   Responded to [place]. Nurse stated no family present at this time. I offered spiritual care with ministry of presence and silent prayer for PT and staff team. Chaplain available upon request.  Colonel Bald [phone]

## 2018-10-15 NOTE — Progress Notes (Signed)
Responded to page from Nurse on Floor. Husband and grandson were in consult room. Husband stated he was thankful for my time with his wife and that he was waiting for more family to arrive. Also, that he would reach out for spiritual care if family requests it. I offered spiritual care with words of comfort, empathic listening and ministry of presence. Chaplain available as needed. Chaplain Fidel Levy  [phone]

## 2018-10-15 NOTE — Consult Note (Signed)
Reason for Consult: Airway obstruction  HPI:  Theresa [name] is an 53 y.o. female who was admitted yesterday from the ER for evaluation of her dyspnea and chest pain. Per EMS the patient was at home having care performed to her tracheostomy, when she became dyspneic, and developed chest pain. Pt has PMHx sig for COPD s/p tracheostomy in 2002(6 metal Glennon Mac), IDDM, HTN, CAD,STEMI, tobacco use, cervical cancer and DVT.  She was noted to have acute respiratory failure and DKA in the ER. Overnight, pt coded, and had her uncuffed Glennon Mac trach tube changed to an ET tube with significant difficulty. Intubation from the top and attempts to insert cuffed trach tube were unsuccessful.  Past Medical History:  Diagnosis Date  . CAD (coronary artery disease)    a. s/p prior PCI. b. CABG 2007 at [facility] in [place] 2007. c. inferior STEMI 10/2015 s/p DES to dSVG-PDA.  Marland Kitchen Cervical cancer ([place])   . Chronic diastolic CHF (congestive heart failure) ([place])   . Chronic respiratory failure ([place])    s/p tracheostomy 2002  . Chronic RUQ pain   . COPD (chronic obstructive pulmonary disease) ([place])   . Diabetes mellitus ([place])   . DVT (deep venous thrombosis) ([place])   . Endometriosis   . History of gallstones 01/2016   seen on Ultrasound  . History of HIDA scan 11/2016   normal  . HTN (hypertension)   . Hyperlipidemia   . Lupus anticoagulant disorder (HCC)    on coumadin  . Morbid obesity ([place])   . ST elevation (STEMI) myocardial infarction involving right coronary artery ([place]) 10/29/15   stent to VG to PDA  . Toe fracture, right 03/29/2018  . Tracheostomy in place [facility]), chronic since 2002 11/03/2015    Past Surgical History:  Procedure Laterality Date  . CARDIAC CATHETERIZATION N/A 10/29/2015   Procedure: Left Heart Cath and Cors/Grafts Angiography;  Surgeon: Burnell Blanks, MD;  Location: [place] CV [facility];  Service: Cardiovascular;  Laterality: N/A;  . CARDIAC CATHETERIZATION  10/29/2015    Procedure: Coronary Stent Intervention;  Surgeon: Burnell Blanks, MD;  Location: [place] [facility];  Service: Cardiovascular;;  . CAROTID STENT    . CESAREAN SECTION WITH BILATERAL TUBAL LIGATION    . CORONARY ARTERY BYPASS GRAFT  2007   2V  . RIGHT/LEFT HEART CATH AND CORONARY/GRAFT ANGIOGRAPHY N/A 03/24/2018   Procedure: RIGHT/LEFT HEART CATH AND CORONARY/GRAFT ANGIOGRAPHY;  Surgeon: Belva Crome, MD;  Location: [place] [facility];  Service: Cardiovascular;  Laterality: N/A;  . TRACHEOSTOMY      Family History  Problem Relation Age of Onset  . Hypertension Mother   . Diabetes Mother   . Hypertension Father   . Diabetes Father     Social History:  reports that she has been smoking cigarettes. She started smoking about 40 years ago. She has been smoking about 0.75 packs per day. She has never used smokeless tobacco. She reports that she does not drink alcohol or use drugs.  Allergies:  Allergies  Allergen Reactions  . Penicillins Rash    Has patient had a PCN reaction causing immediate rash, facial/tongue/throat swelling, SOB or lightheadedness with hypotension: Yes Has patient had a PCN reaction causing severe rash involving mucus membranes or skin necrosis: No Has patient had a PCN reaction that required hospitalization No Has patient had a PCN reaction occurring within the last 10 years: No If all of the above answers are "NO", then may proceed with Cephalosporin  use.   REACTION: rash  . Ciprofloxacin     nausea  . Ibuprofen Rash    swelling in leg     Prior to Admission medications   Medication Sig Start Date End Date Taking? Authorizing Provider  albuterol (PROVENTIL HFA;VENTOLIN HFA) 108 (90 Base) MCG/ACT inhaler Inhale 1-2 puffs into the lungs every 6 (six) hours as needed for wheezing or shortness of breath.    [provider]  albuterol (PROVENTIL) (2.5 MG/3ML) 0.083% nebulizer solution Take 3 mLs (2.5 mg total) by nebulization every 4 (four)  hours as needed for wheezing or shortness of breath. 11/17/15   Donita Brooks, NP  clopidogrel (PLAVIX) 75 MG tablet TAKE 1 TABLET BY MOUTH ONCE DAILY *NEED OFFICE VISIT* 05/30/18   [name], MD  furosemide (LASIX) 40 MG tablet Take 1 tablet (40 mg total) by mouth 2 (two) times daily. 08/04/17   Eber Jones, MD  gabapentin (NEURONTIN) 300 MG capsule Take 300-600 mg by mouth 2 (two) times daily. 12/31/15   [provider]  insulin degludec (TRESIBA FLEXTOUCH) 100 UNIT/ML SOPN FlexTouch Pen Inject 25 Units into the skin daily at 10 pm.    [provider]  isosorbide mononitrate (IMDUR) 30 MG 24 hr tablet Take 1 tablet (30 mg total) by mouth daily. 05/25/18 08/23/18  Strader, Fransisco Hertz, PA-C  lisinopril (PRINIVIL,ZESTRIL) 20 MG tablet Take 1 tablet (20 mg total) by mouth 2 (two) times daily. 04/20/18   [name], MD  metoprolol (LOPRESSOR) 50 MG tablet Take 50 mg by mouth 2 (two) times daily.    [provider]  nitroGLYCERIN (NITROSTAT) 0.4 MG SL tablet Place 0.4 mg under the tongue every 5 (five) minutes as needed for chest pain.    [provider]  pantoprazole (PROTONIX) 40 MG tablet Take 40 mg by mouth daily.    [provider]  rosuvastatin (CRESTOR) 10 MG tablet Take 1 tablet (10 mg total) by mouth daily. 03/30/18   Isaiah Serge, NP  warfarin (COUMADIN) 5 MG tablet Take 2.5-5 mg by mouth See admin instructions. 55m on Monday and Friday; 2.5 mg rest of the days    [provider]    Medications:  I have reviewed the patient's current medications. Scheduled: . albuterol  2.5 mg Nebulization Q4H  . chlorhexidine gluconate (MEDLINE KIT)  15 mL Mouth Rinse BID  . heparin  5,000 Units Subcutaneous Q8H  . mouth rinse  15 mL Mouth Rinse 10 times per day   Continuous: . sodium chloride    . aztreonam Stopped (10/15/18 0722)  . dextrose 5 % and 0.45% NaCl Stopped (10/15/18 0729)  . epinephrine 20 mcg/min (10/15/18 0800)   . famotidine (PEPCID) IV 20 mg (10/15/18 0901)  . fentaNYL infusion INTRAVENOUS 50 mcg/hr (10/15/18 0807)  . insulin 19.3 Units/hr (10/15/18 0755)  . lactated ringers 125 mL/hr at 10/15/18 0800  . potassium chloride 10 mEq (10/15/18 0907)  . propofol    .  sodium bicarbonate (isotonic) infusion in sterile water 150 mL/hr at 10/15/18 0458  . [START ON 10/16/2018] vancomycin    . vasopressin (PITRESSIN) infusion - *FOR SHOCK* 0.03 Units/min (10/15/18 0800)      Dg Chest Port 1 View  Result Date: 10/15/2018 CLINICAL DATA:  Status post CPR. EXAM: PORTABLE CHEST 1 VIEW COMPARISON:  10/14/2018 FINDINGS: The endotracheal tube is in the right mainstem bronchus and needs to be retracted approximately 5 cm. Associated complete opacification of the left hemithorax. Persistent diffuse interstitial and  airspace process likely pulmonary edema. Small bilateral pleural effusions. Surgical changes noted at the right lung base. IMPRESSION: The endotracheal tube is in the right mainstem bronchus and needs to be retracted approximately 5 cm. Associated complete opacification of the left hemithorax. Persistent underlying diffuse interstitial and airspace process, likely edema. These results were called by telephone at the time of interpretation on 10/15/2018 at 4:41 am to [place] in the ICU , who verbally acknowledged these results. Electronically Signed   By: Marijo Sanes M.D.   On: 10/15/2018 04:41   Dg Chest Port 1 View  Result Date: 10/14/2018 CLINICAL DATA:  Respiratory distress EXAM: PORTABLE CHEST 1 VIEW COMPARISON:  03/22/2018 chest radiograph. FINDINGS: Tracheostomy tube terminates over the tracheal air column at the level of the thoracic inlet. Intact sternotomy wires. Stable cardiomediastinal silhouette with mild cardiomegaly. No pneumothorax. No pleural effusion. Diffuse fluffy opacity throughout both lungs, worsened. IMPRESSION: Stable mild cardiomegaly. Diffuse fluffy opacity throughout both lungs, favor  moderate to severe pulmonary edema due to congestive heart failure. Electronically Signed   By: Ilona Sorrel M.D.   On: 10/14/2018 23:07   Review of Systems  Unable to perform ROS: Pt intubated  Blood pressure 110/70, pulse 96, temperature 99.8 F (37.7 C), temperature source Axillary, resp. rate (!) 36, height 4' 10"  (1.473 m), SpO2 96 %. Examination: General: Moderately obese female resting comfortably in bed. On vent via a trach stoma ET tube. Eyes: Pupils are equal, round, reactive to light. Extraocular motion is intact.  Ears: Examination of the ears shows normal auricles and external auditory canals bilaterally.  Nose: Nasal examination shows normal mucosa, septum, turbinates.  Face: Facial examination shows no asymmetry. Palpation of the face elicit no significant tenderness.  Mouth: Oral cavity examination shows no mucosal injury. Neck: 6.0 ET tube via trach stoma. No bleeding. Lungs: diffuse coarse rhonci. Neuro: Awake and responsive.  Assessment/Plan: Acute respiratory distress, now with a 6-0 ET tube in trach stoma. Previous attempts to insert cuffed trach tube were unsuccessful. Currently on epi and vasopressin drips. Once medically stable, will proceed to redo trach in the OR.  Theresa [name] 10/15/2018, 9:25 AM

## 2018-10-15 NOTE — ED Notes (Signed)
Report called to rn on 65m

## 2018-10-15 NOTE — Procedures (Signed)
Emergency Tracheostomy  Patient was intubated from above then wire was placed through the ETT in the trach stoma.  Wire placed and visualized bronchoscopically.  Stoma was dilated and tracheostomy size 6 cuffed distal XLT placed and visualized bronchoscopically above the carina.  Trach sutured.  Rush Farmer, M.D. [facility] Pulmonary/Critical Care Medicine. Pager: [phone]. After hours pager: [phone].

## 2018-10-15 NOTE — Progress Notes (Signed)
[facility] Progress Note Patient Name: Theresa [name] DOB: [birth year] MRN: [id number]   Date of Service  10/15/2018  HPI/Events of Note  Notified of need for stress ulcer prophylaxis.  eICU Interventions  Will order Protonix IV.      Intervention Category Intermediate Interventions: Best-practice therapies (e.g. DVT, beta blocker, etc.)  Sommer,Steven Eugene 10/15/2018, 5:29 AM

## 2018-10-15 NOTE — Anesthesia Procedure Notes (Signed)
Arterial Line Insertion Start/End2/05/2019 5:10 AM, 10/15/2018 5:15 AM Performed by: Suzy Bouchard, CRNA, CRNA  Patient location: ICU. radial was placed Catheter size: 20 G Hand hygiene performed , maximum sterile barriers used  and Seldinger technique used  Attempts: 1 Procedure performed without using ultrasound guided technique. Following insertion, dressing applied and Biopatch. Post procedure assessment: normal  Patient tolerated the procedure well with no immediate complications.

## 2018-10-15 NOTE — Progress Notes (Signed)
CRITICAL VALUE ALERT  Critical Value:  lactic  Date & Time Notied:  10/15/18 1400  Provider Notified: Mann[place]  Orders Received/Actions taken: see new orders

## 2018-10-15 NOTE — Progress Notes (Signed)
[facility] Progress Note Patient Name: ELMARIE DEVLIN DOB: 1965-12-08 MRN: [id number]   Date of Service  10/15/2018  HPI/Events of Note  K+ = 3.6, Ca++ = 7.1 and Creatinine = 0.94.  eICU Interventions  Will replace K+ and Ca++.     Intervention Category Major Interventions: Electrolyte abnormality - evaluation and management  Janyia Guion Eugene 10/15/2018, 11:14 PM

## 2018-10-15 NOTE — Progress Notes (Signed)
..   NAME:  Theresa [name], MRN:  [id number], DOB:  08-Feb-1966, LOS: 0 ADMISSION DATE:  10/14/2018, CONSULTATION DATE:  10/15/2018 REFERRING MD:  Vanita Panda MD, CHIEF COMPLAINT:  Shortness of Breath   Brief History   53 yo F with PMHx of COPD s/p tracheostomy in 2002(6 metal Jackson), IDDM, HTN, CAD, presented to [facility] after she developed dyspnea and chest pain after trach care at home. On labs BG 650mg /dl AG 20. PCCM consulted for admission.  Attempted to intubate orally but unable to ventilate the patient ultimately had ET tube inserted over the tracheal stoma. Patient coded multiple times during this process.  Remains in the ICU on multiple pressors and insulin drip  History of present illness   Pt is a 53 yr old female w/ PMHx sig for COPD s/p tracheostomy in 2002(6 metal Glennon Mac), IDDM, HTN, CAD,STEMI, tobacco use, cervical cancer and DVT  That presented on 10/14/2018 via [place]. Initial triage documentation states that pt was very short of breath despite tracheostomy appearing in place.  ABG was drawn when pt was placed on 98% trach collar along with 100% NRB ABG:7.286/46/66/22 Patient had suctioning, irrigation, flushing of her tracheostomy and a treatment which resulted in some improved respiratory status, though only with multiple breathing treatments, continued suctioning the patient have saturation in the 90% range.  She also received empiric antibiotics due to her CXR. BMET - showed elevated BG with AG--> DKA PCCM asked to admit   Past Medical History  .Marland Kitchen Active Ambulatory Problems    Diagnosis Date Noted  . DM (diabetes mellitus) ([place]) 06/11/2009  . Hyperlipidemia LDL goal <70 06/11/2009  . Essential hypertension 05/27/2009  . CAD, NATIVE VESSEL 05/27/2009  . DVT 06/11/2009  . CORONARY ARTERY BYPASS GRAFT, HX OF 06/11/2009  . Acute ST elevation myocardial infarction (STEMI) involving right coronary artery ([place])   . Tobacco abuse   . ST elevation myocardial infarction  (STEMI) of inferior wall ([place]) 10/29/2015  . Respiratory failure ([place])   . Coronary artery disease involving coronary bypass graft of native heart without angina pectoris   . OSA (obstructive sleep apnea)   . COPD exacerbation ([place])   . Chronic diastolic CHF (congestive heart failure) ([place])   . Lupus anticoagulant syndrome ([place])   . CAD (coronary artery disease) of artery bypass graft: PTCA/DES to distal body VG to PDA 10/29/15 11/03/2015  . Acute respiratory failure with hypoxemia ([place]) 11/03/2015  . Tracheostomy in place [facility]), chronic since 2002 11/03/2015  . Hypokalemia 11/03/2015  . Tracheostomy status ([place])   . Acute respiratory failure with hypoxia ([place]) 01/22/2016  . Type 2 diabetes mellitus with hyperglycemia ([place]) 01/22/2016  . CAP (community acquired pneumonia) 01/22/2016  . CHF (congestive heart failure) ([place])   . Nausea & vomiting   . RUQ pain 12/21/2016  . Dyspnea 07/31/2017  . Diastolic heart failure ([place]) 07/31/2017  . Acute on chronic respiratory failure with hypoxia ([place]) 08/01/2017  . Uncontrolled type 2 diabetes mellitus with hyperglycemia, with long-term current use of insulin ([place]) 08/01/2017  . Acute on chronic diastolic CHF (congestive heart failure) ([place]) 08/01/2017  . Unstable angina ([place]) 03/22/2018  . Superficial femoral artery injury 03/24/2018  . Pulmonary hypertension, unspecified ([place])   . Toe fracture, left 5th phalangeal phalanx 03/29/2018  . Encounter for intubation 04/20/2018   Resolved Ambulatory Problems    Diagnosis Date Noted  . No Resolved Ambulatory Problems   Past Medical History:  Diagnosis Date  . CAD (coronary artery disease)   .  Cervical cancer ([place])   . Chronic respiratory failure ([place])   . Chronic RUQ pain   . COPD (chronic obstructive pulmonary disease) ([place])   . Diabetes mellitus ([place])   . DVT (deep venous thrombosis) ([place])   . Endometriosis   . History of gallstones 01/2016  . History of HIDA scan 11/2016  . HTN  (hypertension)   . Hyperlipidemia   . Lupus anticoagulant disorder ([place])   . Morbid obesity ([place])   . ST elevation (STEMI) myocardial infarction involving right coronary artery ([place]) 10/29/15  . Toe fracture, right 03/29/2018     Significant Hospital Events   2/9 Resp distress after trach care, PEA arrest due to hypoxia, jackson trach removed and ET tube inserted through the stoma  Consults:    Procedures:    Significant Diagnostic Tests:  CXR FINDINGS: Tracheostomy tube terminates over the tracheal air column at the level of the thoracic inlet. Intact sternotomy wires. Stable cardiomediastinal silhouette with mild cardiomegaly. No pneumothorax. No pleural effusion. Diffuse fluffy opacity throughout both lungs, worsened.  IMPRESSION: Stable mild cardiomegaly. Diffuse fluffy opacity throughout both lungs, favor moderate to severe pulmonary edema due to congestive heart failure.  Micro Data:  2/9-blood culture  Antimicrobials:  2/9- Vanco 2/9- Azactam  Interim history/subjective:  On epinephrine and vasopressin Continues on insulin drip Awake, talking on the vent.  Objective   Blood pressure 110/70, pulse 96, temperature 99.8 F (37.7 C), temperature source Axillary, resp. rate (!) 36, height 4\' 10"  (1.473 m), SpO2 96 %.    Vent Mode: PRVC FiO2 (%):  [98 %-100 %] 100 % Set Rate:  [35 bmp] 35 bmp Vt Set:  [500 mL] 500 mL PEEP:  [5 cmH20-10 cmH20] 5 cmH20 Plateau Pressure:  [20 cmH20] 20 cmH20   Intake/Output Summary (Last 24 hours) at 10/15/2018 1610 Last data filed at 10/15/2018 0800 Gross per 24 hour  Intake 449.8 ml  Output 650 ml  Net -200.2 ml   There were no vitals filed for this visit.  Examination: Gen:      No acute distress obese HEENT:  EOMI, sclera anicteric, Size 6.5 ET tube through the tracheal stoma Neck:     No masses; no thyromegaly Lungs:    Bilateral rhonchi CV:         Regular rate and rhythm; no murmurs Abd:      + bowel sounds; soft,  non-tender; no palpable masses, no distension Ext:    No edema; adequate peripheral perfusion Skin:      Peripheral skin mottling. Neuro: Somnolent, arousable.  Neuro status appears to be intact  Assessment & Plan:  Acute respiratory failure with hypoxia secondary to dislodged trach Had a difficult time getting a secure airway.  Now with size 6.5 ET tube Continue full vent support Check ABG, chest x-ray Discussed with Dr. Benjamine Mola, ENT.  She will need ET tube exchanged to a trach in the OR We would like her to be more stable with correction of her DKA, acidosis and hemodynamics before we can take her down.  Cardiac arrest, had a V. tach arrest x3 and a PEA arrest Secondary to hypoxia while trying to secure airway She is awake and responsive on the ventilator Continue telemetry monitoring Off epinephrine and restart as tolerated She will need central venous access  DKA Insulin drip per protocol Replete lytes  Possible sepsis Continue Vanco, Azactam for now Follow Blood cultures  Best practice:  Diet: NPO until AG closes and airway issues are resolved Pain/Anxiety/Delirium  protocol (if indicated): Fentanyl gtt VAP protocol (if indicated): yes post intubation DVT prophylaxis: Hep [place] and SCDs GI prophylaxis: Pepcid IV Glucose control: insulin gtt with stabilizer Mobility: continuous bedrest Q 2 turn Code Status: FULL Family Communication: husband at bedside and understands plan Disposition: ICU  Labs   CBC: Recent Labs  Lab 10/14/18 2247 10/14/18 2313 10/15/18 0352 10/15/18 0458 10/15/18 0527 10/15/18 0550  WBC 13.4*  --   --   --   --   --   NEUTROABS 8.1*  --   --   --   --   --   HGB 18.3* 17.3* 15.0 14.3 13.9 17.0*  HCT 55.9* 51.0* 44.0 42.0 41.0 50.0*  MCV 97.9  --   --   --   --   --   PLT 254  --   --   --   --   --     Basic Metabolic Panel: Recent Labs  Lab 10/14/18 2247  10/15/18 0352 10/15/18 0458 10/15/18 0527 10/15/18 0533 10/15/18 0550  NA 133*    < > 141 145 144 146* 145  K 4.5   < > 3.3* 2.7* 2.6* 2.5* 2.4*  CL 96*  --   --   --   --  102  --   CO2 17*  --   --   --   --  19*  --   GLUCOSE 650*  --   --   --   --  768*  --   BUN 10  --   --   --   --  14  --   CREATININE 0.91  --   --   --   --  1.54*  --   CALCIUM 8.8*  --   --   --   --  8.2*  --   MG  --   --   --   --   --  2.7*  --   PHOS  --   --   --   --   --  2.2*  --    < > = values in this interval not displayed.   GFR: CrCl cannot be calculated (Unknown ideal weight.). Recent Labs  Lab 10/14/18 2247  WBC 13.4*    Liver Function Tests: Recent Labs  Lab 10/14/18 2247  AST 38  ALT 27  ALKPHOS 133*  BILITOT 0.8  PROT 8.1  ALBUMIN 3.5   No results for input(s): LIPASE, AMYLASE in the last 168 hours. No results for input(s): AMMONIA in the last 168 hours.  ABG    Component Value Date/Time   PHART 7.271 (L) 10/15/2018 0550   PCO2ART 45.5 10/15/2018 0550   PO2ART 62.0 (L) 10/15/2018 0550   HCO3 21.0 10/15/2018 0550   TCO2 22 10/15/2018 0550   ACIDBASEDEF 6.0 (H) 10/15/2018 0550   O2SAT 88.0 10/15/2018 0550     Coagulation Profile: No results for input(s): INR, PROTIME in the last 168 hours.  Cardiac Enzymes: Recent Labs  Lab 10/14/18 2247  TROPONINI <0.03    HbA1C: Hgb A1c MFr Bld  Date/Time Value Ref Range Status  03/22/2018 02:34 PM 8.5 (H) 4.8 - 5.6 % Final    Comment:    (NOTE) Pre diabetes:          5.7%-6.4% Diabetes:              >6.4% Glycemic control for   <7.0% adults with diabetes   08/01/2017 06:26 AM 8.0 (  H) 4.8 - 5.6 % Final    Comment:    (NOTE)         Prediabetes: 5.7 - 6.4         Diabetes: >6.4         Glycemic control for adults with diabetes: <7.0     CBG: Recent Labs  Lab 10/15/18 0406 10/15/18 0432 10/15/18 0537 10/15/18 0648 10/15/18 0754  GLUCAP >600* >600* >600* 595* 542*   The patient is critically ill with multiple organ system failure and requires high complexity decision making for  assessment and support, frequent evaluation and titration of therapies, advanced monitoring, review of radiographic studies and interpretation of complex data.   Critical Care Time devoted to patient care services, exclusive of separately billable procedures, described in this note is 35 minutes.   Marshell Garfinkel MD [facility] Pulmonary and Critical [facility] Pager [phone] If no answer call [phone] 10/15/2018, 9:59 AM

## 2018-10-15 NOTE — Procedures (Addendum)
Intubation and Tracheostomy Tube Change Procedure Note Theresa [name] [id number] [birth year]  Procedure: Intubation Indications: Respiratory insufficiency  Procedure Details Consent: Risks of procedure as well as the alternatives and risks of each were explained to the (patient/caregiver).  Consent for procedure obtained. Time Out: Verified patient identification, verified procedure, site/side was marked, verified correct patient position, special equipment/implants available, medications/allergies/relevent history reviewed, required imaging and test results available.  Performed  Maximum sterile technique was used including gloves, hand hygiene and mask.  MAC and 3  Existing [name] was a size 6 metal non fenestrated uncuffed tube  Sat low 80s despite 98% TC and 100% NRB Increased work of breathing. RSI given Visualized VC with glideoscope Could not pass ETT sizes 8, 7.5. 6 Pt began to desaturate despite bagging and previous trach being in place Bougie advanced into existing trach exchanged out for a size 6 ETT and bagged with AMBU bag Oral intubation with ETT size 5 able to pass baqlloon through VC- unable to deliver breaths with AMBu and no color change Post CXR ETT repositioned ( pulled back by 3 cm) into optimal position  Evaluation Hemodynamic Status: stable BP; O2 sats: transiently fell during during procedure Patient's Current Condition: stable Complications: No apparent complications Patient did not tolerate procedure well. Chest X-ray ordered to verify placement.  CXR: tube position acceptable.   [name] 10/15/2018

## 2018-10-15 NOTE — Procedures (Signed)
Central Venous Catheter Insertion Procedure Note Theresa [name] [id number] 18-Dec-1965  Procedure: Insertion of Central Venous Catheter Indications: Assessment of intravascular volume, Drug and/or fluid administration and Frequent blood sampling  Procedure Details Consent: Risks of procedure as well as the alternatives and risks of each were explained to the (patient/caregiver).  Consent for procedure obtained. Time Out: Verified patient identification, verified procedure, site/side was marked, verified correct patient position, special equipment/implants available, medications/allergies/relevent history reviewed, required imaging and test results available.  Performed  Maximum sterile technique was used including antiseptics, cap, gloves, gown, hand hygiene, mask and sheet. Skin prep: Chlorhexidine; local anesthetic administered A antimicrobial bonded/coated triple lumen catheter was placed in the right femoral vein due to multiple attempts, no other available access using the Seldinger technique. Ultrasound guidance used.Yes.   Catheter placed to 20 cm. Blood aspirated via all 3 ports and then flushed x 3. Line sutured x 2 and dressing applied.  Evaluation Blood flow good Complications: No apparent complications Patient did tolerate procedure well. Chest X-ray ordered to verify placement.  CXR: not needed    Theresa [name] ACNP Maryanna Shape PCCM Pager [phone] till 1 pm If no answer page [phone] 10/15/2018, 11:00 AM

## 2018-10-15 NOTE — H&P (Addendum)
..   NAME:  Theresa [name], MRN:  [id number], DOB:  [birth year], LOS: 0 ADMISSION DATE:  10/14/2018, CONSULTATION DATE:  10/15/2018 REFERRING MD:  Vanita Panda MD, CHIEF COMPLAINT:  Shortness of Breath   Brief History   53 yo F with PMHx of COPD s/p tracheostomy in 2002(6 metal Jackson), IDDM, HTN, CAD, presented to [facility] after she developed dyspnea and chest pain post trach care. On labs BG 650mg /dl AG 20. PCCM consulted for admission.  History of present illness   Pt is a 53 yr old female w/ PMHx sig for COPD s/p tracheostomy in 2002(6 metal Glennon Mac), IDDM, HTN, CAD,STEMI, tobacco use, cervical cancer and DVT  That presented on 10/14/2018 via [place]. Initial triage documentation states that pt was very short of breath despite tracheostomy appearing in place.  ABG was drawn when pt was placed on 98% trach collar along with 100% NRB ABG:7.286/46/66/22 Patient had suctioning, irrigation, flushing of her tracheostomy and a treatment which resulted in some improved respiratory status, though only with multiple breathing treatments, continued suctioning the patient have saturation in the 90% range. She received for chest pain: 324mg  of aspirin ion the way here with 2 sl nitro. She also received empiric antibiotics due to her CXR. BMET - showed elevated BG with AG--> DKA PCCM asked to admit    Past Medical History  .Marland Kitchen Active Ambulatory Problems    Diagnosis Date Noted  . DM (diabetes mellitus) ([place]) 06/11/2009  . Hyperlipidemia LDL goal <70 06/11/2009  . Essential hypertension 05/27/2009  . CAD, NATIVE VESSEL 05/27/2009  . DVT 06/11/2009  . CORONARY ARTERY BYPASS GRAFT, HX OF 06/11/2009  . Acute ST elevation myocardial infarction (STEMI) involving right coronary artery ([place])   . Tobacco abuse   . ST elevation myocardial infarction (STEMI) of inferior wall ([place]) 10/29/2015  . Respiratory failure ([place])   . Coronary artery disease involving coronary bypass graft of native heart without angina  pectoris   . OSA (obstructive sleep apnea)   . COPD exacerbation ([place])   . Chronic diastolic CHF (congestive heart failure) ([place])   . Lupus anticoagulant syndrome ([place])   . CAD (coronary artery disease) of artery bypass graft: PTCA/DES to distal body VG to PDA 10/29/15 11/03/2015  . Acute respiratory failure with hypoxemia ([place]) 11/03/2015  . Tracheostomy in place [facility]), chronic since 2002 11/03/2015  . Hypokalemia 11/03/2015  . Tracheostomy status ([place])   . Acute respiratory failure with hypoxia ([place]) 01/22/2016  . Type 2 diabetes mellitus with hyperglycemia ([place]) 01/22/2016  . CAP (community acquired pneumonia) 01/22/2016  . CHF (congestive heart failure) ([place])   . Nausea & vomiting   . RUQ pain 12/21/2016  . Dyspnea 07/31/2017  . Diastolic heart failure ([place]) 07/31/2017  . Acute on chronic respiratory failure with hypoxia ([place]) 08/01/2017  . Uncontrolled type 2 diabetes mellitus with hyperglycemia, with long-term current use of insulin ([place]) 08/01/2017  . Acute on chronic diastolic CHF (congestive heart failure) ([place]) 08/01/2017  . Unstable angina ([place]) 03/22/2018  . Superficial femoral artery injury 03/24/2018  . Pulmonary hypertension, unspecified ([place])   . Toe fracture, left 5th phalangeal phalanx 03/29/2018  . Medication management 04/20/2018   Resolved Ambulatory Problems    Diagnosis Date Noted  . No Resolved Ambulatory Problems   Past Medical History:  Diagnosis Date  . CAD (coronary artery disease)   . Cervical cancer ([place])   . Chronic respiratory failure ([place])   . Chronic RUQ pain   . COPD (chronic obstructive pulmonary  disease) ([place])   . Diabetes mellitus ([place])   . DVT (deep venous thrombosis) ([place])   . Endometriosis   . History of gallstones 01/2016  . History of HIDA scan 11/2016  . HTN (hypertension)   . Hyperlipidemia   . Lupus anticoagulant disorder ([place])   . Morbid obesity ([place])   . ST elevation (STEMI) myocardial infarction involving right coronary  artery ([place]) 10/29/15  . Toe fracture, right 03/29/2018     Significant Hospital Events   Resp distress  Consults:  none  Procedures:  Suctioning trach and breathing Rx  Significant Diagnostic Tests:  CXR FINDINGS: Tracheostomy tube terminates over the tracheal air column at the level of the thoracic inlet. Intact sternotomy wires. Stable cardiomediastinal silhouette with mild cardiomegaly. No pneumothorax. No pleural effusion. Diffuse fluffy opacity throughout both lungs, worsened.  IMPRESSION: Stable mild cardiomegaly. Diffuse fluffy opacity throughout both lungs, favor moderate to severe pulmonary edema due to congestive heart failure.  Micro Data:  Blood cx x 2 drawn on 10/15/2018   Antimicrobials:  Vancomycin - started in ED 02/09 Azactam - started in ED 02/09   Interim history/subjective:  On PCCM evaluation She appears fatigued son and husband at bedisde She is flushed in appearance and is able to speak with occlusion of her trancheostomy She states that she takes care of her trach at home and her last visit to trach clinic was in 2017 She was caring for her trach when the symptoms began. She is coughing but only bringing up a small amount. She also reports chest pain and tightness.  She is on trach collar 98% and O2 sat 88%. The trach in place appears to be a 6 metal Glennon Mac ( as documented in the last notes at trach clinic)   Objective   Blood pressure (!) 145/105, pulse (!) 146, temperature (!) 97.4 F (36.3 C), resp. rate (!) 26, SpO2 (!) 88 %.    FiO2 (%):  [98 %] 98 %  No intake or output data in the 24 hours ending 10/15/18 0042 There were no vitals filed for this visit.  Examination: General: obese female in resp distress  HENT: normocephalic atraumatic increased neck circumference tracheostomy appears to be in place, no visible bleeding or swelling no subcutaneous emphysema noted Lungs: diffuse coarse rhonci with exp wheezing Cardiovascular: S1 and  S2 increased rate Abdomen: obese soft abdomen slightly distended non tender hypoactive BS Extremities: =1 pitting edema in lower ext with chronic peripheral vascular changes Neuro: intact no deficits    Assessment & Plan:  1. Acute Respiratory Failure with Hypoxia Multifactorial etiology: A. Acute Pulmonary edema (most likely negative pressure secondary to trach being dislodged/obstructed): B. mucous plugging C. Chronic trach ? Tracheal stenosis - less likely due to onset of symptoms D. Tracheomalacia -no prior issues before this  Last documented trach was a metal 6 jackson which seems to be what is in place On NRB over trach and sat 88% ABG: 7.28/46/66/22 Given her appearance she has a high risk of decompensation if airway is not appropiately addressed Plan: Endotracheally intubate  Cap existing trach to close ventilation- per RT no cap for this trach was availableOnce oxygenation improves exam with Bronchoscopy Therapeutic suctioning of any mucous plug and evaluate existing trach Exchange trach if we can establish patent un-compromised tract. High risk of decompensation during this procedure, primary priority secure an airway Will also attempt to diurese patient with Lasix IV post Rx DKA  Once CXR improves and PF ratio improves will wean  pt off of mechanical ventilation If Bronchoscopy reveals stenosis or any other complication from long standing trach she will need evaluation and intervention by ENT.   2. DKA Ag corrected for Albumin is 21.3 Delta gap 9.3  BG 650mg /dl on BMET Plan: Insulin gtt with stabilizer BMET and VBG Q 4 IVF LR with potassium replacement when K= <4 When BG 250mg /dl switch IVF to D5 1/2 NS When AG closed transition to subcutaneous insulin 1mg /kg ( 1/2 dosed long acting and other 1/2 for meal coverage)  3. Sepsis: No evidence of soft tissue infection CXR appears to be edema no indication of underlying pneumonia at this time Plan: Check PCT continue  on empiric abx  F/u cultures, WBC and trend fever curve De-escalate when possible  Best practice:  Diet: NPO until AG closes and airway issues are resolved Pain/Anxiety/Delirium protocol (if indicated): Fentanyl gtt VAP protocol (if indicated): yes post intubation DVT prophylaxis: Hep Canadian and SCDs GI prophylaxis: Pepcid IV Glucose control: insulin gtt with stabilizer Mobility: continuous bedrest Q 2 turn Code Status: FULL Family Communication: husband at bedside and understands plan Disposition: ICU  Labs   CBC: Recent Labs  Lab 10/14/18 2247 10/14/18 2313  WBC 13.4*  --   NEUTROABS 8.1*  --   HGB 18.3* 17.3*  HCT 55.9* 51.0*  MCV 97.9  --   PLT 254  --     Basic Metabolic Panel: Recent Labs  Lab 10/14/18 2247 10/14/18 2313  NA 133* 132*  K 4.5 3.9  CL 96*  --   CO2 17*  --   GLUCOSE 650*  --   BUN 10  --   CREATININE 0.91  --   CALCIUM 8.8*  --    GFR: CrCl cannot be calculated (Unknown ideal weight.). Recent Labs  Lab 10/14/18 2247  WBC 13.4*    Liver Function Tests: Recent Labs  Lab 10/14/18 2247  AST 38  ALT 27  ALKPHOS 133*  BILITOT 0.8  PROT 8.1  ALBUMIN 3.5   No results for input(s): LIPASE, AMYLASE in the last 168 hours. No results for input(s): AMMONIA in the last 168 hours.  ABG    Component Value Date/Time   PHART 7.286 (L) 10/14/2018 2313   PCO2ART 46.9 10/14/2018 2313   PO2ART 66.0 (L) 10/14/2018 2313   HCO3 22.3 10/14/2018 2313   TCO2 24 10/14/2018 2313   ACIDBASEDEF 5.0 (H) 10/14/2018 2313   O2SAT 90.0 10/14/2018 2313     Coagulation Profile: No results for input(s): INR, PROTIME in the last 168 hours.  Cardiac Enzymes: Recent Labs  Lab 10/14/18 2247  TROPONINI <0.03    HbA1C: Hgb A1c MFr Bld  Date/Time Value Ref Range Status  03/22/2018 02:34 PM 8.5 (H) 4.8 - 5.6 % Final    Comment:    (NOTE) Pre diabetes:          5.7%-6.4% Diabetes:              >6.4% Glycemic control for   <7.0% adults with diabetes     08/01/2017 06:26 AM 8.0 (H) 4.8 - 5.6 % Final    Comment:    (NOTE)         Prediabetes: 5.7 - 6.4         Diabetes: >6.4         Glycemic control for adults with diabetes: <7.0     CBG: No results for input(s): GLUCAP in the last 168 hours.  Review of Systems:  Past Medical History  She,  has a past medical history of CAD (coronary artery disease), Cervical cancer (HCC), Chronic diastolic CHF (congestive heart failure) ([place]), Chronic respiratory failure ([place]), Chronic RUQ pain, COPD (chronic obstructive pulmonary disease) ([place]), Diabetes mellitus ([place]), DVT (deep venous thrombosis) ([place]), Endometriosis, History of gallstones (01/2016), History of HIDA scan (11/2016), HTN (hypertension), Hyperlipidemia, Lupus anticoagulant disorder ([place]), Morbid obesity ([place]), ST elevation (STEMI) myocardial infarction involving right coronary artery ([place]) (10/29/15), Toe fracture, right (03/29/2018), and Tracheostomy in place [facility]), chronic since 2002 (11/03/2015).   Surgical History    Past Surgical History:  Procedure Laterality Date  . CARDIAC CATHETERIZATION N/A 10/29/2015   Procedure: Left Heart Cath and Cors/Grafts Angiography;  Surgeon: Burnell Blanks, MD;  Location: [place] CV [facility];  Service: Cardiovascular;  Laterality: N/A;  . CARDIAC CATHETERIZATION  10/29/2015   Procedure: Coronary Stent Intervention;  Surgeon: Burnell Blanks, MD;  Location: [place] [facility];  Service: Cardiovascular;;  . CAROTID STENT    . CESAREAN SECTION WITH BILATERAL TUBAL LIGATION    . CORONARY ARTERY BYPASS GRAFT  2007   2V  . RIGHT/LEFT HEART CATH AND CORONARY/GRAFT ANGIOGRAPHY N/A 03/24/2018   Procedure: RIGHT/LEFT HEART CATH AND CORONARY/GRAFT ANGIOGRAPHY;  Surgeon: Belva Crome, MD;  Location: [place] [facility];  Service: Cardiovascular;  Laterality: N/A;  . TRACHEOSTOMY       Social History   reports that she has been smoking cigarettes. She started smoking about 40 years ago. She  has been smoking about 0.75 packs per day. She has never used smokeless tobacco. She reports that she does not drink alcohol or use drugs.   Family History   Her family history includes Diabetes in her father and mother; Hypertension in her father and mother.   Allergies Allergies  Allergen Reactions  . Penicillins Rash    Has patient had a PCN reaction causing immediate rash, facial/tongue/throat swelling, SOB or lightheadedness with hypotension: Yes Has patient had a PCN reaction causing severe rash involving mucus membranes or skin necrosis: No Has patient had a PCN reaction that required hospitalization No Has patient had a PCN reaction occurring within the last 10 years: No If all of the above answers are "NO", then may proceed with Cephalosporin use.   REACTION: rash  . Ciprofloxacin     nausea  . Ibuprofen Rash    swelling in leg      Home Medications  Prior to Admission medications   Medication Sig Start Date End Date Taking? Authorizing Provider  albuterol (PROVENTIL HFA;VENTOLIN HFA) 108 (90 Base) MCG/ACT inhaler Inhale 1-2 puffs into the lungs every 6 (six) hours as needed for wheezing or shortness of breath.    [provider]  albuterol (PROVENTIL) (2.5 MG/3ML) 0.083% nebulizer solution Take 3 mLs (2.5 mg total) by nebulization every 4 (four) hours as needed for wheezing or shortness of breath. 11/17/15   Donita Brooks, NP  clopidogrel (PLAVIX) 75 MG tablet TAKE 1 TABLET BY MOUTH ONCE DAILY *NEED OFFICE VISIT* 05/30/18   [name], MD  furosemide (LASIX) 40 MG tablet Take 1 tablet (40 mg total) by mouth 2 (two) times daily. 08/04/17   Eber Jones, MD  gabapentin (NEURONTIN) 300 MG capsule Take 300-600 mg by mouth 2 (two) times daily. 12/31/15   [provider]  insulin degludec (TRESIBA FLEXTOUCH) 100 UNIT/ML SOPN FlexTouch Pen Inject 25 Units into the skin daily at 10 pm.    [provider]  isosorbide mononitrate (  IMDUR) 30 MG  24 hr tablet Take 1 tablet (30 mg total) by mouth daily. 05/25/18 08/23/18  Strader, Fransisco Hertz, PA-C  lisinopril (PRINIVIL,ZESTRIL) 20 MG tablet Take 1 tablet (20 mg total) by mouth 2 (two) times daily. 04/20/18   [name], MD  metoprolol (LOPRESSOR) 50 MG tablet Take 50 mg by mouth 2 (two) times daily.    [provider]  nitroGLYCERIN (NITROSTAT) 0.4 MG SL tablet Place 0.4 mg under the tongue every 5 (five) minutes as needed for chest pain.    [provider]  pantoprazole (PROTONIX) 40 MG tablet Take 40 mg by mouth daily.    [provider]  rosuvastatin (CRESTOR) 10 MG tablet Take 1 tablet (10 mg total) by mouth daily. 03/30/18   Isaiah Serge, NP  warfarin (COUMADIN) 5 MG tablet Take 2.5-5 mg by mouth See admin instructions. 5mg  on Monday and Friday; 2.5 mg rest of the days    [provider]     Critical care time: 65 mins     I, Dr Seward Carol have personally reviewed patient's available data, including medical history, events of note, physical examination and test results as part of my evaluation. I have discussed with NP Heber Largo  and other care providers such as pharmacist, RN and Elink.  In addition,  I personally evaluated patient  The patient is critically ill with multiple organ systems failure and requires high complexity decision making for assessment and support, frequent evaluation and titration of therapies, application of advanced monitoring technologies and extensive interpretation of multiple databases.   Critical Care Time devoted to patient care services described in this note is  65 Minutes. This time reflects time of care of this signee Dr Seward Carol. This critical care time does not reflect procedure time, or teaching time or supervisory time of NP but could involve care discussion time   Dr. Seward Carol Pulmonary Critical Care Medicine  10/15/2018 2:09 AM

## 2018-10-15 NOTE — Procedures (Signed)
Bedside Bronch for trach placement  Procedure done by Dr. Vaughan Browner. At first bronch was introduce through oral ET tube and structures of tracheal rings, carina identified for operator of tracheostomy who was Dr Nelda Marseille. Under bronchoscopy guidance,  ET tube was pulled back sufficiently and very carefully. The ET tube was  pulled back enough to give room for tracheostomy operator and yet at same time to to ensure a secured airway.   After this was accomplished, bronchoscope was withdrawn into the ET tube. After this,  Dr. Nelda Marseille then removed the ETT that was placed in the trach stoma over a wire and replaced with distal XLT trach. Followng introduction of tracheostomy,  the bronchoscope was removed from ET tube and introduced through tracheostomy. Correct position of tracheostomy was ensured, with enough room between carina and distal tracheostomy and no evidence of bleeding. The bronchoscope was then withdrawn. Respiratory therapist was then instructed to remove the ET tube.  Dr. Nelda Marseille then proceeded to complete the tracheostomy with stay sutures.  No complications   Marshell Garfinkel MD [place] [facility] Pager [phone] If no answer call 336 [phone] 10/15/2018, 2:40 PM

## 2018-10-15 NOTE — Progress Notes (Signed)
RT NOTE:  RT assisted MD with intubation with a size 6.5 ETT and bronchoscopy. MD was able to place wire within the ETT at the pt's stoma site and insert a #6  Cuffed Shiley XLT Distal Trach. Pt is stable at this time. RT will continue to monitor.

## 2018-10-15 NOTE — Procedures (Signed)
Intubation Procedure Note KIZZY OLAFSON [id number] 1965/10/05  Procedure: Intubation Indications: Respiratory insufficiency  Procedure Details Consent: Risks of procedure as well as the alternatives and risks of each were explained to the (patient/caregiver).  Consent for procedure obtained. Time Out: Verified patient identification, verified procedure, site/side was marked, verified correct patient position, special equipment/implants available, medications/allergies/relevent history reviewed, required imaging and test results available.  Performed  Maximum sterile technique was used including gloves, hand hygiene and mask.  MAC    Evaluation Hemodynamic Status: BP stable throughout; O2 sats: stable throughout Patient's Current Condition: stable Complications: No apparent complications Patient did tolerate procedure well. Chest X-ray ordered to verify placement.  CXR: pending.   Jennet Maduro 10/15/2018

## 2018-10-15 NOTE — ED Notes (Signed)
Pt CBG reading of HI or above reference range. Reported result to [employer].

## 2018-10-15 NOTE — Significant Event (Addendum)
  RESP INSUFFICIENCY  Discussed with Patient and Husband and explained plan to try to intubate to evaluate [name] And to protect her airway and improve oxygenation They were agreeable to plan On evaluation after arrival to ICU pt appeared tired Sat low 80s despite 98% TC and 100% NRB Increased work of breathing. RSI given Existing trach was left in place. AMBU bag was placed over existing trach with PEEP valve. Visualized VC with glideoscope Could not pass ETT sizes 8, 7.5. 6 Never lost view of vocal cords Pt began to desaturate despite bagging and previous trach being in place Bougie advanced into existing trach exchanged out for a size 6 ETT and bagged with AMBU bag Thick pink frothy secretions in ETT when bagging. Oral intubation with ETT size 5 able to pass baqlloon through VC- unable to deliver breaths with AMBu and no color change   CODE NOTE: 1st CODE  VTACH initial rhythm  1 defibrilation Epi x 3 1 lidocaine 1 bicarb ROSC 3:17 AM  2nd CODE 3:19 VTACH Defibrillation delivered Epi x 1 ROSC 3:23 AM  3rd CODE 3:23 AM VTACH Mag Calcium Epi x1 2 bicarb Amio bolus 10 units insulin- BS read high ROSC 3:25 AM  3:30 AM PEA arrest Epi x 2  Bicarb Epi x 1 ROSC 3:37 AM  3:53AM Bicarb x 2 Epi x 2 Bicarb x 2 Epi x 1 10 units insulin ROSC 4:00AM  4:03 AM PEA arrest Epi x 1 20 units insulin ROSC at 4:09  Pt at 4:30 AM awake and moving extremities talking over the trach  Currently on Epi gtt @ 20 CXR performed and ETT pulled back by 3 cm O2 sats >90% PEEP decreased ABG: 7.23/51/75/21 Arterial line placed in Left radial by Anesthesia Also on Bicarb gtt Insulin gtt Attempted a right femoral CVC after pt began to wake up- line placement was unsuccessful. Did not attempt to place an IJ or subclavian CVC due to risk of pneumothorax and in the setting of recent airway compromise and cardiac arrest benefit did not exceed risk.   Family updated Husband  was present for CODE event    .Marland KitchenSigned Dr Seward Carol Pulmonary Critical Care Locums

## 2018-10-16 ENCOUNTER — Inpatient Hospital Stay (HOSPITAL_COMMUNITY): Payer: Medicare Other

## 2018-10-16 DIAGNOSIS — Z930 Tracheostomy status: Secondary | ICD-10-CM

## 2018-10-16 DIAGNOSIS — R0602 Shortness of breath: Secondary | ICD-10-CM

## 2018-10-16 DIAGNOSIS — J9601 Acute respiratory failure with hypoxia: Secondary | ICD-10-CM

## 2018-10-16 LAB — POCT I-STAT 7, (LYTES, BLD GAS, ICA,H+H)
Acid-Base Excess: 2 mmol/L (ref 0.0–2.0)
Bicarbonate: 28.4 mmol/L — ABNORMAL HIGH (ref 20.0–28.0)
Calcium, Ion: 1.04 mmol/L — ABNORMAL LOW (ref 1.15–1.40)
HCT: 38 % (ref 36.0–46.0)
Hemoglobin: 12.9 g/dL (ref 12.0–15.0)
O2 Saturation: 94 %
PCO2 ART: 49.7 mmHg — AB (ref 32.0–48.0)
Patient temperature: 98.7
Potassium: 4.4 mmol/L (ref 3.5–5.1)
Sodium: 134 mmol/L — ABNORMAL LOW (ref 135–145)
TCO2: 30 mmol/L (ref 22–32)
pH, Arterial: 7.365 (ref 7.350–7.450)
pO2, Arterial: 75 mmHg — ABNORMAL LOW (ref 83.0–108.0)

## 2018-10-16 LAB — RESPIRATORY PANEL BY PCR
Adenovirus: NOT DETECTED
Bordetella pertussis: NOT DETECTED
CORONAVIRUS OC43-RVPPCR: NOT DETECTED
Chlamydophila pneumoniae: NOT DETECTED
Coronavirus 229E: NOT DETECTED
Coronavirus HKU1: NOT DETECTED
Coronavirus NL63: NOT DETECTED
Influenza A: NOT DETECTED
Influenza B: NOT DETECTED
Metapneumovirus: NOT DETECTED
Mycoplasma pneumoniae: NOT DETECTED
PARAINFLUENZA VIRUS 3-RVPPCR: NOT DETECTED
Parainfluenza Virus 1: NOT DETECTED
Parainfluenza Virus 2: NOT DETECTED
Parainfluenza Virus 4: NOT DETECTED
Respiratory Syncytial Virus: NOT DETECTED
Rhinovirus / Enterovirus: NOT DETECTED

## 2018-10-16 LAB — GLUCOSE, CAPILLARY
Glucose-Capillary: 123 mg/dL — ABNORMAL HIGH (ref 70–99)
Glucose-Capillary: 147 mg/dL — ABNORMAL HIGH (ref 70–99)
Glucose-Capillary: 161 mg/dL — ABNORMAL HIGH (ref 70–99)
Glucose-Capillary: 178 mg/dL — ABNORMAL HIGH (ref 70–99)
Glucose-Capillary: 188 mg/dL — ABNORMAL HIGH (ref 70–99)
Glucose-Capillary: 195 mg/dL — ABNORMAL HIGH (ref 70–99)
Glucose-Capillary: 208 mg/dL — ABNORMAL HIGH (ref 70–99)
Glucose-Capillary: 213 mg/dL — ABNORMAL HIGH (ref 70–99)

## 2018-10-16 LAB — BASIC METABOLIC PANEL
Anion gap: 14 (ref 5–15)
BUN: 16 mg/dL (ref 6–20)
CO2: 22 mmol/L (ref 22–32)
Calcium: 7.4 mg/dL — ABNORMAL LOW (ref 8.9–10.3)
Chloride: 101 mmol/L (ref 98–111)
Creatinine, Ser: 1.04 mg/dL — ABNORMAL HIGH (ref 0.44–1.00)
GFR calc Af Amer: >60 mL/min (ref >60)
GFR calc non Af Amer: >60 mL/min (ref >60)
GLUCOSE: 216 mg/dL — AB (ref 70–99)
Potassium: 5.2 mmol/L — ABNORMAL HIGH (ref 3.5–5.1)
Sodium: 137 mmol/L (ref 135–145)

## 2018-10-16 LAB — URINE CULTURE: Culture: NO GROWTH

## 2018-10-16 LAB — MAGNESIUM: Magnesium: 1.5 mg/dL — ABNORMAL LOW (ref 1.7–2.4)

## 2018-10-16 LAB — ECHOCARDIOGRAM COMPLETE
Height: 58 in
Weight: 3576.74 oz

## 2018-10-16 LAB — PHOSPHORUS: Phosphorus: 4 mg/dL (ref 2.5–4.6)

## 2018-10-16 LAB — LACTIC ACID, PLASMA
LACTIC ACID, VENOUS: 3 mmol/L — AB (ref 0.5–1.9)
Lactic Acid, Venous: 3.2 mmol/L (ref 0.5–1.9)
Lactic Acid, Venous: 2 mmol/L (ref 0.5–1.9)

## 2018-10-16 LAB — PROCALCITONIN: Procalcitonin: 1.99 ng/mL

## 2018-10-16 MED ORDER — GABAPENTIN 250 MG/5ML PO SOLN
300.0000 mg | ORAL | Status: DC
  Administered 2018-10-16 – 2018-10-19 (×4): 300 mg
  Filled 2018-10-16 (×4): qty 6

## 2018-10-16 MED ORDER — VITAL HIGH PROTEIN PO LIQD
1000.0000 mL | ORAL | Status: DC
  Administered 2018-10-16 – 2018-10-19 (×4): 1000 mL

## 2018-10-16 MED ORDER — PERFLUTREN LIPID MICROSPHERE
1.0000 mL | INTRAVENOUS | Status: AC | PRN
  Administered 2018-10-16: 2 mL via INTRAVENOUS
  Filled 2018-10-16: qty 10

## 2018-10-16 MED ORDER — SODIUM CHLORIDE 0.9% FLUSH
10.0000 mL | Freq: Two times a day (BID) | INTRAVENOUS | Status: DC
  Administered 2018-10-17 – 2018-10-19 (×5): 10 mL

## 2018-10-16 MED ORDER — SENNOSIDES 8.8 MG/5ML PO SYRP
5.0000 mL | ORAL_SOLUTION | Freq: Every day | ORAL | Status: DC
  Administered 2018-10-16 – 2018-10-19 (×4): 5 mL
  Filled 2018-10-16 (×4): qty 5

## 2018-10-16 MED ORDER — SODIUM CHLORIDE 0.9 % IV SOLN
2.0000 g | Freq: Three times a day (TID) | INTRAVENOUS | Status: DC
  Administered 2018-10-16 – 2018-10-19 (×9): 2 g via INTRAVENOUS
  Filled 2018-10-16 (×10): qty 2

## 2018-10-16 MED ORDER — SODIUM CHLORIDE 0.9% FLUSH: 10.0000 mL | INTRAVENOUS | Status: DC | PRN

## 2018-10-16 MED ORDER — CHLORHEXIDINE GLUCONATE CLOTH 2 % EX PADS
6.0000 | MEDICATED_PAD | Freq: Every day | CUTANEOUS | Status: DC
  Administered 2018-10-16 – 2018-10-19 (×4): 6 via TOPICAL

## 2018-10-16 MED ORDER — ALBUTEROL SULFATE (2.5 MG/3ML) 0.083% IN NEBU
2.5000 mg | INHALATION_SOLUTION | RESPIRATORY_TRACT | Status: DC | PRN
  Administered 2018-10-16 – 2018-10-18 (×3): 2.5 mg via RESPIRATORY_TRACT
  Filled 2018-10-16 (×3): qty 3

## 2018-10-16 MED ORDER — POLYETHYLENE GLYCOL 3350 17 G PO PACK
17.0000 g | PACK | Freq: Every day | ORAL | Status: DC
  Administered 2018-10-16 – 2018-10-19 (×4): 17 g via ORAL
  Filled 2018-10-16 (×4): qty 1

## 2018-10-16 MED ORDER — FUROSEMIDE 10 MG/ML IJ SOLN
40.0000 mg | Freq: Once | INTRAMUSCULAR | Status: AC
  Administered 2018-10-16: 40 mg via INTRAVENOUS
  Filled 2018-10-16: qty 4

## 2018-10-16 MED ORDER — PERFLUTREN LIPID MICROSPHERE
INTRAVENOUS | Status: AC
  Filled 2018-10-16: qty 10

## 2018-10-16 MED ORDER — FAMOTIDINE IN NACL 20-0.9 MG/50ML-% IV SOLN
20.0000 mg | Freq: Two times a day (BID) | INTRAVENOUS | Status: DC
  Administered 2018-10-16 – 2018-10-19 (×6): 20 mg via INTRAVENOUS
  Filled 2018-10-16 (×7): qty 50

## 2018-10-16 MED FILL — Medication: Qty: 1 | Status: AC

## 2018-10-16 NOTE — Progress Notes (Signed)
Initial Nutrition Assessment  DOCUMENTATION CODES:   Morbid obesity  INTERVENTION:    Vital High Protein at 55 ml/h (1320 ml per day)   Provides 1320 kcal, 116 gm protein, 1104 ml free water daily  NUTRITION DIAGNOSIS:   Inadequate oral intake related to inability to eat as evidenced by NPO status.  GOAL:   Provide needs based on ASPEN/SCCM guidelines  MONITOR:   Vent status, TF tolerance, Skin, Labs  REASON FOR ASSESSMENT:   Ventilator, Consult Enteral/tube feeding initiation and management  ASSESSMENT:   53 yo female with PMH of Lupus anticoagulant D/O, CAD, DM, HTN, HLD, cervical cancer, trach, COPD, CHF, morbid obesity who was admitted with dislodged trach, PEA arrest due to hypoxia. Theresa [name] was replaced 2/9.   Received MD Consult for TF initiation and management. Cortrak tube being placed today. Patient does not have a G-tube, so suspect she took PO's PTA.   Patient is currently intubated on ventilator support MV: 8.6 L/min Temp (24hrs), Avg:99.8 F (37.7 C), Min:98.6 F (37 C), Max:102.1 F (38.9 C)   Labs reviewed. Potassium 5.2 (H), magnesium 1.5 (L), lactic acid 3 (H) CBG's: [phone] Medications reviewed and include novolog, lantus, sodium bicarb, vasopressin, levophed.    NUTRITION - FOCUSED PHYSICAL EXAM:    Most Recent Value  Orbital Region  No depletion  Upper Arm Region  No depletion  Thoracic and Lumbar Region  No depletion  Buccal Region  No depletion  Temple Region  No depletion  Clavicle Bone Region  No depletion  Clavicle and Acromion Bone Region  No depletion  Scapular Bone Region  Unable to assess  Dorsal Hand  No depletion  Patellar Region  No depletion  Anterior Thigh Region  No depletion  Posterior Calf Region  No depletion  Edema (RD Assessment)  Mild  Hair  Reviewed  Eyes  Unable to assess  Mouth  Unable to assess  Skin  Reviewed  Nails  Reviewed       Diet Order:   Diet Order            Diet NPO time  specified  Diet effective now              EDUCATION NEEDS:   No education needs have been identified at this time  Skin:  Skin Assessment: Reviewed RN Assessment  Last BM:  no BM documented  Height:   Ht Readings from Last 1 Encounters:  10/15/18 4\' 10"  (1.473 m)    Weight:   Wt Readings from Last 1 Encounters:  10/16/18 101.4 kg    Ideal Body Weight:  43.9 kg  BMI:  Body mass index is 46.72 kg/m.  Estimated Nutritional Needs:   Kcal:  5176-1607  Protein:  110 gm  Fluid:  >/= 1.5 L    Molli Barrows, RD, LDN, [place] Pager [phone] After Hours Pager [phone]

## 2018-10-16 NOTE — Progress Notes (Signed)
Subjective: Pt resting comfortably in bed. On vent via a Shiley XLT trach. Trach placed by Critical Care yesterday.  Objective: Vital signs in last 24 hours: Temp:  [98.6 F (37 C)-102.1 F (38.9 C)] 98.7 F (37.1 C) (02/10 1124) Pulse Rate:  [75-98] 85 (02/10 1000) Resp:  [19-35] 19 (02/10 1000) BP: (79-138)/(55-99) 93/59 (02/10 1000) SpO2:  [92 %-99 %] 93 % (02/10 1000) Arterial Line BP: (94-132)/(62-88) 94/62 (02/10 1000) FiO2 (%):  [50 %-100 %] 50 % (02/10 0802) Weight:  [101.4 kg] 101.4 kg (02/10 0401)  Examination: General:Moderately obese female resting comfortably in bed. On vent via trach. Eyes: Pupils are equal, round, reactive to light.  Ears: Examination of the ears shows normal auricles and external auditory canals bilaterally.  Nose: Nasal examination shows normal mucosa, septum, turbinates.  Face: Facial examination shows no asymmetry.  Mouth: Oral cavity examination shows no mucosal injury. Neck: An XLT trach is in place.  Recent Labs    10/14/18 2247  10/15/18 1111 10/15/18 1126  WBC 13.4*  --   --  26.5*  HGB 18.3*   < > 11.2* 14.7  HCT 55.9*   < > 33.0* 44.9  PLT 254  --   --  265   < > = values in this interval not displayed.   Recent Labs    10/15/18 2144 10/16/18 0526  NA 134* 137  K 3.6 5.2*  CL 97* 101  CO2 25 22  GLUCOSE 154* 216*  BUN 14 16  CREATININE 0.94 1.04*  CALCIUM 7.1* 7.4*    Medications:  I have reviewed the patient's current medications. Scheduled: . albuterol  2.5 mg Nebulization BID  . chlorhexidine gluconate (MEDLINE KIT)  15 mL Mouth Rinse BID  . Chlorhexidine Gluconate Cloth  6 each Topical Q0600  . furosemide  40 mg Intravenous Once  . gabapentin  300 mg Per Tube Q24H  . insulin aspart  0-15 Units Subcutaneous Q4H  . insulin glargine  20 Units Subcutaneous Q24H  . mouth rinse  15 mL Mouth Rinse 10 times per day  . polyethylene glycol  17 g Oral Daily  . sennosides  5 mL Per Tube Daily  . [START ON 10/17/2018]  sodium chloride flush  10-40 mL Intracatheter Q12H   Continuous: . ceFEPime (MAXIPIME) IV    . dextrose 5 % and 0.45% NaCl Stopped (10/15/18 2244)  . famotidine (PEPCID) IV    . fentaNYL infusion INTRAVENOUS 125 mcg/hr (10/16/18 1000)  . insulin Stopped (10/16/18 0034)  . norepinephrine (LEVOPHED) Adult infusion 8 mcg/min (10/16/18 1000)  . vasopressin (PITRESSIN) infusion - *FOR SHOCK* Stopped (10/16/18 9643)    Assessment/Plan: Acute respiratory failure. Critical care was able to insert a trach tube via the stoma.  - Airway is stable. Wean vent as tolerated. - Will sign off.    LOS: 1 day   Anastaisa Wooding W [place] 10/16/2018, 11:49 AM

## 2018-10-16 NOTE — Procedures (Signed)
Cortrak  Tube Type:  Cortrak - 43 inches Tube Location:  Left nare Initial Placement:  Stomach Secured by: Bridle Technique Used to Measure Tube Placement:  Documented cm marking at nare/ corner of mouth Cortrak Secured At:  65 cm    Cortrak Tube Team Note:  Consult received to place a Cortrak feeding tube.   No x-ray is required. RN may begin using tube.   If the tube becomes dislodged please keep the tube and contact the Cortrak team at [email or web address] (password [email or web address]) for replacement.  If after hours and replacement cannot be delayed, place a NG tube and confirm placement with an abdominal x-ray.    Florencia Zaccaro MS, RD, LDN Pager #- [phone] Office#- [phone] After Hours Pager: [phone]   

## 2018-10-16 NOTE — Progress Notes (Signed)
..   NAME:  Theresa [name], MRN:  [id number], DOB:  [birth year], LOS: 1 ADMISSION DATE:  10/14/2018, CONSULTATION DATE:  10/15/2018 REFERRING MD:  Vanita Panda MD, CHIEF COMPLAINT:  Shortness of Breath   Brief History    53 yr old female w/ PMHx sig for COPD s/p tracheostomy in 2002(6 metal [place] Mac), IDDM, HTN, CAD,STEMI, tobacco use, cervical cancer and DVT  That presented on 10/14/2018 via [facility]. Initial triage documentation states that pt was very short of breath despite tracheostomy appearing in place.  ABG was drawn when pt was placed on 98% trach collar along with 100% NRB ABG:7.286/46/66/22 Patient had suctioning, irrigation, flushing of her tracheostomy and a treatment which resulted in some improved respiratory status, though only with multiple breathing treatments, continued suctioning the patient have saturation in the 90% range.  She also received empiric antibiotics due to her CXR. BMET - showed elevated BG with AG--> DKA PCCM asked to admit     Significant Hospital Events   2/9 Resp distress after trach care, PEA arrest due to hypoxia, jackson trach removed and ET tube inserted through the stoma. S/p trach and central line placemebnt. On epinephrine and vasopressin Continues on insulin drip Awake, talking on the vent.   Consults:    Procedures:    Significant Diagnostic Tests:  CXR FINDINGS: Tracheostomy tube terminates over the tracheal air column at the level of the thoracic inlet. Intact sternotomy wires. Stable cardiomediastinal silhouette with mild cardiomegaly. No pneumothorax. No pleural effusion. Diffuse fluffy opacity throughout both lungs, worsened.  IMPRESSION: Stable mild cardiomegaly. Diffuse fluffy opacity throughout both lungs, favor moderate to severe pulmonary edema due to congestive heart failure.  Micro Data:  2/9-blood culture  Antimicrobials:  2/9- Vanco 2/9- Azactam  Interim history/subjective:   2/10 - coming down on pressors. On  vasop and levophed 29mcg only. Down to 50% fio2. On lot of fluids. Following commands. Writing saying her left foot neuropathy is moderately bad and was initiating complaint. Home gabapentin on hold. A line not working well. Trach fine and on vent  Objective   Blood pressure (!) 93/59, pulse 85, temperature 98.6 F (37 C), temperature source Oral, resp. rate 19, height 4\' 10"  (1.473 m), weight 101.4 kg, SpO2 93 %.    Vent Mode: PRVC FiO2 (%):  [50 %-100 %] 50 % Set Rate:  [18 bmp-35 bmp] 18 bmp Vt Set:  [500 mL] 500 mL PEEP:  [5 cmH20] 5 cmH20 Plateau Pressure:  [23 cmH20-29 cmH20] 23 cmH20   Intake/Output Summary (Last 24 hours) at 10/16/2018 1041 Last data filed at 10/16/2018 1000 Gross per 24 hour  Intake 11624.52 ml  Output 2225 ml  Net 9399.52 ml   Filed Weights   10/15/18 0800 10/16/18 0401  Weight: 99.8 kg 101.4 kg   General Appearance:  Looks criticall ill OBESE - + Head:  Normocephalic, without obvious abnormality, atraumatic Eyes:  PERRL - yes, conjunctiva/corneas - clear     Ears:  Normal external ear canals, both ears Nose:  G tube - no Throat:  ETT TUBE - no. TRACH + , OG tube - n Neck:  Supple,  No enlargement/tenderness/nodules Lungs: Clear to auscultation bilaterally, Ventilator   Synchrony - yes Heart:  S1 and S2 normal, no murmur, CVP - no.  Pressors - yes Abdomen:  Soft, no masses, no organomegaly Genitalia / Rectal:  Not done Extremities:  Extremities- intact. short Skin:  ntact in exposed areas . Sacral area - not examined. Has PSORIASIS + Neurologic:  Sedation - fent gtt -> RASS - 0 . Moves all 4s - yes. CAM-ICU - neg . Orientation - x3+     LABS    PULMONARY Recent Labs  Lab 10/14/18 2313 10/15/18 0352 10/15/18 0458 10/15/18 0527 10/15/18 0550 10/15/18 1111  PHART 7.286*  --  7.233*  --  7.271* 7.402  PCO2ART 46.9  --  51.8*  --  45.5 36.1  PO2ART 66.0*  --  75.0*  --  62.0* 89.0  HCO3 22.3 23.3 21.9 21.2 21.0 22.4  TCO2 24 26 23 22 22 24    O2SAT 90.0 38.0 92.0 85.0 88.0 97.0    CBC Recent Labs  Lab 10/14/18 2247  10/15/18 0550 10/15/18 1111 10/15/18 1126  HGB 18.3*   < > 17.0* 11.2* 14.7  HCT 55.9*   < > 50.0* 33.0* 44.9  WBC 13.4*  --   --   --  26.5*  PLT 254  --   --   --  265   < > = values in this interval not displayed.    COAGULATION Recent Labs  Lab 10/15/18 1126  INR 3.66    CARDIAC   Recent Labs  Lab 10/14/18 2247  TROPONINI <0.03   No results for input(s): PROBNP in the last 168 hours.   CHEMISTRY Recent Labs  Lab 10/15/18 0533  10/15/18 1111 10/15/18 1126 10/15/18 1625 10/15/18 2144 10/16/18 0526  NA 146*   < > 148* 146* 143 134* 137  K 2.5*   < > <2.0* 2.3* 3.5 3.6 5.2*  CL 102  --   --  98 103 97* 101  CO2 19*  --   --  27 28 25 22   GLUCOSE 768*  --   --  357* 144* 154* 216*  BUN 14  --   --  14 15 14 16   CREATININE 1.54*  --   --  1.42* 1.05* 0.94 1.04*  CALCIUM 8.2*  --   --  8.3* 7.6* 7.1* 7.4*  MG 2.7*  --   --  1.8  --   --   --   PHOS 2.2*  --   --  <1.0*  --  2.5  --    < > = values in this interval not displayed.   Estimated Creatinine Clearance: 65 mL/min (A) (by C-G formula based on SCr of 1.04 mg/dL (H)).   LIVER Recent Labs  Lab 10/14/18 2247 10/15/18 1126  AST 38  --   ALT 27  --   ALKPHOS 133*  --   BILITOT 0.8  --   PROT 8.1  --   ALBUMIN 3.5  --   INR  --  3.66     INFECTIOUS Recent Labs  Lab 10/15/18 1625 10/15/18 2144 10/16/18 0526  LATICACIDVEN 5.3* 3.3* 3.2*     ENDOCRINE CBG (last 3)  Recent Labs    10/16/18 0034 10/16/18 0359 10/16/18 0728  GLUCAP 123* 178* 213*         IMAGING x48h  - image(s) personally visualized  -   highlighted in bold Dg Chest Port 1 View  Result Date: 10/15/2018 CLINICAL DATA:  Tracheostomy tube EXAM: PORTABLE CHEST 1 VIEW COMPARISON:  Chest radiograph 10/15/2018 FINDINGS: Tracheostomy tube terminates in the mid trachea. Monitoring leads overlie the patient. Stable cardiomegaly status post  median sternotomy. Similar-appearing right-greater-than-left patchy consolidation interstitial opacities. Small bilateral pleural effusions. Postsurgical changes right lower hemithorax. No pneumothorax. Thoracic spine degenerative changes. IMPRESSION: Tracheostomy tube terminates in the mid  trachea. Similar-appearing bilateral patchy areas of consolidation and interstitial opacities which may represent edema and/or infection. Small bilateral effusions. Electronically Signed   By: Lovey Newcomer M.D.   On: 10/15/2018 14:45   Dg Chest Port 1 View  Result Date: 10/15/2018 CLINICAL DATA:  Endotracheal tube placement. EXAM: PORTABLE CHEST 1 VIEW COMPARISON:  Earlier film, same date. FINDINGS: The endotracheal tube has been retracted. It is 12 mm above the carina and could be retracted further. Interval re-aeration of the left lung. Persistent diffuse but asymmetric interstitial and airspace process, likely pulmonary edema. Suspect small effusions. IMPRESSION: 1. The endotracheal tube is 12 mm above the carina and could be retracted another 2 or 3 cm. 2. Re-aeration of the left lung. 3. Persistent diffuse but asymmetric interstitial and airspace process, likely pulmonary edema. Electronically Signed   By: Marijo Sanes M.D.   On: 10/15/2018 10:33   Dg Chest Port 1 View  Result Date: 10/15/2018 CLINICAL DATA:  Status post CPR. EXAM: PORTABLE CHEST 1 VIEW COMPARISON:  10/14/2018 FINDINGS: The endotracheal tube is in the right mainstem bronchus and needs to be retracted approximately 5 cm. Associated complete opacification of the left hemithorax. Persistent diffuse interstitial and airspace process likely pulmonary edema. Small bilateral pleural effusions. Surgical changes noted at the right lung base. IMPRESSION: The endotracheal tube is in the right mainstem bronchus and needs to be retracted approximately 5 cm. Associated complete opacification of the left hemithorax. Persistent underlying diffuse interstitial and  airspace process, likely edema. These results were called by telephone at the time of interpretation on 10/15/2018 at 4:41 am to [place] in the ICU , who verbally acknowledged these results. Electronically Signed   By: Marijo Sanes M.D.   On: 10/15/2018 04:41   Dg Chest Port 1 View  Result Date: 10/14/2018 CLINICAL DATA:  Respiratory distress EXAM: PORTABLE CHEST 1 VIEW COMPARISON:  03/22/2018 chest radiograph. FINDINGS: Tracheostomy tube terminates over the tracheal air column at the level of the thoracic inlet. Intact sternotomy wires. Stable cardiomediastinal silhouette with mild cardiomegaly. No pneumothorax. No pleural effusion. Diffuse fluffy opacity throughout both lungs, worsened. IMPRESSION: Stable mild cardiomegaly. Diffuse fluffy opacity throughout both lungs, favor moderate to severe pulmonary edema due to congestive heart failure. Electronically Signed   By: Ilona Sorrel M.D.   On: 10/14/2018 23:07     Assessment & Plan:  Acute respiratory failure with hypoxia secondary to dislodged trach - treated emergently with ET tube via stoma and later converstion to PDT 10/15/2018    -10/16/2018 - > does not meet criteria for SBT/Extubation in setting of Acute Respiratory Failure due to volme overload on cxr  PLan =  Start lasix x 1 dose  - full vent support   Cardiac arrest, had a V. tach arrest x3 and a PEA arrest Secondary to hypoxia while trying to secure airway  10/16/2018 - coming off pressors  PLAN MAP goal > 65  Hx of gr3 diast dysfn in July 2019  - appears volume overloaded 10/16/2018 - check echo  - lasix x 1 dose  DKA  .10/16/2018 - gap closed, bic normal  Plan  - change dka protocol to icu phase 2 hyperglycemia protocll    Possible sepsis Continue Vanco, Azactam for now Follow Blood cultures   PAIN -neuropathy of LLE - start 1/3rd of home dose neurontin  AKI  - resolved  - no ace inhibitor (a home med)  Hx of DV 2010 x 1 and Lupus anticoag positive per  note  with INR goal 2-3 per patient - check INR tomorrow 10/17/18 and decide on restart anticoag     Best practice:  Diet: start TF Pain/Anxiety/Delirium protocol (if indicated): Fentanyl gtt - slowly wean VAP protocol (if indicated): yes post intubation DVT prophylaxis:scd - awwait INR GI prophylaxis: Pepcid IV Glucose control: insulin gtt with stabilizer Mobility: continuous bedrest Q 2 turn Code Status: FULL Family Communication: husband at bedside and understands plan Disposition: ICU   ATTESTATION & SIGNATURE   The patient Theresa [name] is critically ill with multiple organ systems failure and requires high complexity decision making for assessment and support, frequent evaluation and titration of therapies, application of advanced monitoring technologies and extensive interpretation of multiple databases.   Critical Care Time devoted to patient care services described in this note is  30  Minutes. This time reflects time of care of this signee Dr Brand Males. This critical care time does not reflect procedure time, or teaching time or supervisory time of PA/NP/Med student/Med Resident etc but could involve care discussion time     Dr. Brand Males, M.D., [facility].C.P Pulmonary and Critical Care Medicine Staff Physician [facility] Pager: [phone], If no answer or between  15:00h - 7:00h: call [phone]  10/16/2018 10:41 AM

## 2018-10-16 NOTE — Progress Notes (Signed)
Pharmacy Antibiotic Note  Theresa [name] is a 53 y.o. female admitted on 10/14/2018 with pneumonia.  MRSA PCR is negative so stopping Vancomycin. Penicillin allergy is rash only so changing Aztreonam to Cefepime per discussion with Dr. Chase Caller.   Plan: Cefepime 2g IV every 8 hours Monitor renal function, culture results, and clinical status.  Monitor for any signs of allergic reaction.  Follow-up cultures for ability to narrow/discontinue as able.   Height: 4\' 10"  (147.3 cm) Weight: 223 lb 8.7 oz (101.4 kg) IBW/kg (Calculated) : 40.9  Temp (24hrs), Avg:99.7 F (37.6 C), Min:98.6 F (37 C), Max:102.1 F (38.9 C)  Recent Labs  Lab 10/14/18 2247 10/15/18 0533 10/15/18 1126 10/15/18 1625 10/15/18 2144 10/16/18 0526  WBC 13.4*  --  26.5*  --   --   --   CREATININE 0.91 1.54* 1.42* 1.05* 0.94 1.04*  LATICACIDVEN  --   --  >11.0* 5.3* 3.3* 3.2*    Estimated Creatinine Clearance: 65 mL/min (A) (by C-G formula based on SCr of 1.04 mg/dL (H)).    Allergies  Allergen Reactions  . Penicillins Rash    Has patient had a PCN reaction causing immediate rash, facial/tongue/throat swelling, SOB or lightheadedness with hypotension: Yes Has patient had a PCN reaction causing severe rash involving mucus membranes or skin necrosis: No Has patient had a PCN reaction that required hospitalization No Has patient had a PCN reaction occurring within the last 10 years: No If all of the above answers are "NO", then may proceed with Cephalosporin use.   REACTION: rash  . Ciprofloxacin     nausea  . Ibuprofen Rash    swelling in leg     Antimicrobials this admission: 2/9 Vanc >> 2/10 2/9 Aztreonam >> 2/10 2/10 Cefepime >>  Microbiology results: Pending  Thank you for allowing pharmacy to be a part of this patient's care.  Sloan Leiter, PharmD, BCPS, BCCCP Clinical Pharmacist Please refer to [facility] for [place] numbers  **Pharmacist phone directory can now be found on [email or web address]  (PW [email or web address]).  Listed under [place]. 10/16/2018 11:27 AM

## 2018-10-16 NOTE — Progress Notes (Signed)
Inpatient Diabetes Program Recommendations  AACE/ADA: New Consensus Statement on Inpatient Glycemic Control (2015)  Target Ranges:  Prepandial:   less than 140 mg/dL      Peak postprandial:   less than 180 mg/dL (1-2 hours)      Critically ill patients:  140 - 180 mg/dL   Lab Results  Component Value Date   GLUCAP 213 (H) 10/16/2018   HGBA1C 8.5 (H) 03/22/2018    Review of Glycemic Control Results for Theresa [name], Theresa [name] (MRN [id number]) as of 10/16/2018 10:24  Ref. Range 10/15/2018 22:42 10/15/2018 23:35 10/16/2018 00:34 10/16/2018 03:59 10/16/2018 07:28  Glucose-Capillary Latest Ref Range: 70 - 99 mg/dL 152 (H) 147 (H) 123 (H) 178 (H) 213 (H)   Diabetes history: DM  Outpatient Diabetes medications: Tresiba 25 units q HS  Current orders for Inpatient glycemic control:  Novolog moderate q 4 hours, Lantus 20 units q 24 hours Inpatient Diabetes Program Recommendations:    May consider increasing Lantus to 25 units q 24 hours.  Will follow.   Thanks  Adah Perl, RN, BC-ADM Inpatient Diabetes Coordinator Pager [phone] (8a-5p)

## 2018-10-16 NOTE — Progress Notes (Signed)
Echocardiogram 2D Echocardiogram has been performed.  10/16/2018 12:45 PM Maudry Mayhew, MHA, RVT, RDCS, RDMS

## 2018-10-17 ENCOUNTER — Ambulatory Visit (INDEPENDENT_AMBULATORY_CARE_PROVIDER_SITE_OTHER): Payer: Self-pay | Admitting: Pulmonary Disease

## 2018-10-17 ENCOUNTER — Inpatient Hospital Stay (HOSPITAL_COMMUNITY): Payer: Medicare Other

## 2018-10-17 DIAGNOSIS — I25118 Atherosclerotic heart disease of native coronary artery with other forms of angina pectoris: Secondary | ICD-10-CM

## 2018-10-17 DIAGNOSIS — I469 Cardiac arrest, cause unspecified: Secondary | ICD-10-CM

## 2018-10-17 LAB — GLUCOSE, CAPILLARY
GLUCOSE-CAPILLARY: 194 mg/dL — AB (ref 70–99)
Glucose-Capillary: 188 mg/dL — ABNORMAL HIGH (ref 70–99)
Glucose-Capillary: 196 mg/dL — ABNORMAL HIGH (ref 70–99)
Glucose-Capillary: 219 mg/dL — ABNORMAL HIGH (ref 70–99)
Glucose-Capillary: 250 mg/dL — ABNORMAL HIGH (ref 70–99)

## 2018-10-17 LAB — CBC
HCT: 40.5 % (ref 36.0–46.0)
HEMOGLOBIN: 13.1 g/dL (ref 12.0–15.0)
MCH: 32.6 pg (ref 26.0–34.0)
MCHC: 32.3 g/dL (ref 30.0–36.0)
MCV: 100.7 fL — ABNORMAL HIGH (ref 80.0–100.0)
Platelets: 177 10*3/uL (ref 150–400)
RBC: 4.02 MIL/uL (ref 3.87–5.11)
RDW: 14 % (ref 11.5–15.5)
WBC: 13.9 10*3/uL — ABNORMAL HIGH (ref 4.0–10.5)
nRBC: 0 % (ref 0.0–0.2)

## 2018-10-17 LAB — PHOSPHORUS: Phosphorus: 3.9 mg/dL (ref 2.5–4.6)

## 2018-10-17 LAB — PROTIME-INR
INR: 1.74
Prothrombin Time: 20.2 seconds — ABNORMAL HIGH (ref 11.4–15.2)

## 2018-10-17 LAB — HEPARIN LEVEL (UNFRACTIONATED): Heparin Unfractionated: 0.23 IU/mL — ABNORMAL LOW (ref 0.30–0.70)

## 2018-10-17 LAB — BASIC METABOLIC PANEL
Anion gap: 9 (ref 5–15)
BUN: 24 mg/dL — ABNORMAL HIGH (ref 6–20)
CHLORIDE: 103 mmol/L (ref 98–111)
CO2: 26 mmol/L (ref 22–32)
Calcium: 7.7 mg/dL — ABNORMAL LOW (ref 8.9–10.3)
Creatinine, Ser: 0.91 mg/dL (ref 0.44–1.00)
GFR calc Af Amer: >60 mL/min (ref >60)
GFR calc non Af Amer: >60 mL/min (ref >60)
GLUCOSE: 209 mg/dL — AB (ref 70–99)
Potassium: 4.3 mmol/L (ref 3.5–5.1)
Sodium: 138 mmol/L (ref 135–145)

## 2018-10-17 LAB — CULTURE, RESPIRATORY W GRAM STAIN: Culture: NORMAL

## 2018-10-17 LAB — MAGNESIUM: Magnesium: 1.7 mg/dL (ref 1.7–2.4)

## 2018-10-17 LAB — CULTURE, RESPIRATORY

## 2018-10-17 LAB — PROCALCITONIN: PROCALCITONIN: 1.59 ng/mL

## 2018-10-17 LAB — TROPONIN I: Troponin I: 6.18 ng/mL (ref <0.03)

## 2018-10-17 MED ORDER — ONDANSETRON HCL 4 MG/2ML IJ SOLN
4.0000 mg | Freq: Once | INTRAMUSCULAR | Status: AC
  Administered 2018-10-17: 4 mg via INTRAVENOUS
  Filled 2018-10-17: qty 2

## 2018-10-17 MED ORDER — HEPARIN (PORCINE) 25000 UT/250ML-% IV SOLN
1250.0000 [IU]/h | INTRAVENOUS | Status: DC
  Administered 2018-10-17: 900 [IU]/h via INTRAVENOUS
  Administered 2018-10-18: 1200 [IU]/h via INTRAVENOUS
  Administered 2018-10-19: 1250 [IU]/h via INTRAVENOUS
  Filled 2018-10-17 (×3): qty 250

## 2018-10-17 MED ORDER — MAGNESIUM SULFATE 2 GM/50ML IV SOLN
2.0000 g | Freq: Once | INTRAVENOUS | Status: AC
  Administered 2018-10-17: 2 g via INTRAVENOUS
  Filled 2018-10-17: qty 50

## 2018-10-17 MED ORDER — MIDAZOLAM HCL (PF) 5 MG/ML IJ SOLN: 1.0000 mg | INTRAMUSCULAR | Status: DC | PRN

## 2018-10-17 MED ORDER — MIDAZOLAM HCL 2 MG/2ML IJ SOLN
1.0000 mg | INTRAMUSCULAR | Status: DC | PRN
  Administered 2018-10-18 – 2018-10-19 (×2): 2 mg via INTRAVENOUS
  Filled 2018-10-17 (×3): qty 2

## 2018-10-17 MED ORDER — FENTANYL CITRATE (PF) 100 MCG/2ML IJ SOLN
50.0000 ug | INTRAMUSCULAR | Status: DC | PRN
  Administered 2018-10-17: 100 ug via INTRAVENOUS
  Administered 2018-10-17 – 2018-10-18 (×2): 50 ug via INTRAVENOUS
  Administered 2018-10-19: 100 ug via INTRAVENOUS
  Filled 2018-10-17 (×5): qty 2

## 2018-10-17 MED ORDER — FUROSEMIDE 10 MG/ML IJ SOLN
40.0000 mg | Freq: Three times a day (TID) | INTRAMUSCULAR | Status: DC
  Administered 2018-10-17 – 2018-10-18 (×4): 40 mg via INTRAVENOUS
  Filled 2018-10-17 (×4): qty 4

## 2018-10-17 MED ORDER — POTASSIUM CHLORIDE 20 MEQ/15ML (10%) PO SOLN
40.0000 meq | Freq: Two times a day (BID) | ORAL | Status: DC
  Administered 2018-10-17 (×2): 40 meq
  Filled 2018-10-17 (×2): qty 30

## 2018-10-17 NOTE — Consult Note (Addendum)
CARDIOLOGY CONSULT NOTE    Patient ID: Theresa [name]; [id number]; 1966-02-25   Admit date: 10/14/2018 Date of Consult: 10/17/2018  Primary Care Provider: Monico Blitz, MD Primary Cardiologist: [name], MD Primary Electrophysiologist:  None   Patient Profile:   Theresa [name] is a 53 y.o. female with a hx of COPD s/p tracheostomy in 2002, insulin dependent DM, CAD s/p CABG, and Pulm HTN who is being seen today for the evaluation of acute worsening of her LVEF.  History of Present Illness:   Theresa [name] presented to the [facility] ED on 2/08 with progressive dyspnea. ABG obtained on 100% NRB illustrated hypoxia with an elevated A-a gradient. Initial labs were consistent with DKA. She was subsequently intubated through tracheotomy stoma, aggressively hydrated, and started on an insulin drip. On the night of admission she further decompensated into PEA (6 times) and underwent multiple rounds of CPR. She was started on vasopressin and norepinephrine. Over the course of the next couple of days (2/09-2/11) her DKA has resolved and she is now off pressors; however, her CXR has shown worsening/stable pulmonary edema and she has been slow to wean from the vent.   Today she is doing well but having some chest discomfort. She is slightly nauseous and would like some ice chips. The patient's sister and mother are at bedside. Very concerned that she will develop ARDS and further deteriorate. Asking about mechanical support devices. Discussed that we need to work on getting the patient to euvolemia and this will hopefully help her to wean from the vent. They voice understanding. All questions and concerns addressed.   Past Medical History:  Diagnosis Date  . CAD (coronary artery disease)    a. s/p prior PCI. b. CABG 2007 at [facility] in [place] 2007. c. inferior STEMI 10/2015 s/p DES to dSVG-PDA.  Marland Kitchen Cervical cancer ([place])   . Chronic diastolic CHF (congestive heart failure) ([place])   .  Chronic respiratory failure ([place])    s/p tracheostomy 2002  . Chronic RUQ pain   . COPD (chronic obstructive pulmonary disease) ([place])   . Diabetes mellitus ([place])   . DVT (deep venous thrombosis) ([place])   . Endometriosis   . History of gallstones 01/2016   seen on Ultrasound  . History of HIDA scan 11/2016   normal  . HTN (hypertension)   . Hyperlipidemia   . Lupus anticoagulant disorder (H[facility])    on coumadin  . Morbid obesity ([place])   . ST elevation (STEMI) myocardial infarction involving right coronary artery ([place]) 10/29/15   stent to VG to PDA  . Toe fracture, right 03/29/2018  . Tracheostomy in place [facility]), chronic since 2002 11/03/2015   Past Surgical History:  Procedure Laterality Date  . CARDIAC CATHETERIZATION N/A 10/29/2015   Procedure: Left Heart Cath and Cors/Grafts Angiography;  Surgeon: Burnell Blanks, MD;  Location: [place] [facility];  Service: Cardiovascular;  Laterality: N/A;  . CARDIAC CATHETERIZATION  10/29/2015   Procedure: Coronary Stent Intervention;  Surgeon: Burnell Blanks, MD;  Location: [facility];  Service: Cardiovascular;;  . CAROTID STENT    . CESAREAN SECTION WITH BILATERAL TUBAL LIGATION    . CORONARY ARTERY BYPASS GRAFT  2007   2V  . RIGHT/LEFT HEART CATH AND CORONARY/GRAFT ANGIOGRAPHY N/A 03/24/2018   Procedure: RIGHT/LEFT HEART CATH AND CORONARY/GRAFT ANGIOGRAPHY;  Surgeon: Belva Crome, MD;  Location: [facility];  Service: Cardiovascular;  Laterality: N/A;  . TRACHEOSTOMY  Home Medications:  Prior to Admission medications   Medication Sig Start Date End Date Taking? Authorizing Provider  gabapentin (NEURONTIN) 300 MG capsule Take 300 mg by mouth 3 (three) times daily.  12/31/15  Yes [provider]  isosorbide mononitrate (IMDUR) 30 MG 24 hr tablet Take 1 tablet (30 mg total) by mouth daily. 05/25/18 10/15/18 Yes Strader, Fransisco Hertz, PA-C  nitroGLYCERIN (NITROSTAT) 0.4 MG SL tablet Place 0.4 mg under the  tongue every 5 (five) minutes as needed for chest pain.   Yes [provider]  warfarin (COUMADIN) 5 MG tablet Take 2.5-5 mg by mouth See admin instructions. 22m on Monday and Friday; 2.5 mg rest of the days   Yes [provider]  albuterol (PROVENTIL HFA;VENTOLIN HFA) 108 (90 Base) MCG/ACT inhaler Inhale 1-2 puffs into the lungs every 6 (six) hours as needed for wheezing or shortness of breath.    [provider]  albuterol (PROVENTIL) (2.5 MG/3ML) 0.083% nebulizer solution Take 3 mLs (2.5 mg total) by nebulization every 4 (four) hours as needed for wheezing or shortness of breath. 11/17/15   ODonita Brooks NP  clopidogrel (PLAVIX) 75 MG tablet TAKE 1 TABLET BY MOUTH ONCE DAILY *NEED OFFICE VISIT* 05/30/18   HMinus Breeding MD  furosemide (LASIX) 40 MG tablet Take 1 tablet (40 mg total) by mouth 2 (two) times daily. Patient not taking: Reported on 10/15/2018 08/04/17   KEber Jones MD  insulin degludec (TRESIBA FLEXTOUCH) 100 UNIT/ML SOPN FlexTouch Pen Inject 25 Units into the skin daily at 10 pm.    [provider]  lisinopril (PRINIVIL,ZESTRIL) 20 MG tablet Take 1 tablet (20 mg total) by mouth 2 (two) times daily. Patient not taking: Reported on 10/15/2018 04/20/18   HMinus Breeding MD  metoprolol (LOPRESSOR) 50 MG tablet Take 50 mg by mouth 2 (two) times daily.    [provider]  pantoprazole (PROTONIX) 40 MG tablet Take 40 mg by mouth daily.    [provider]  rosuvastatin (CRESTOR) 10 MG tablet Take 1 tablet (10 mg total) by mouth daily. 03/30/18   IIsaiah Serge NP   Inpatient Medications: Scheduled Meds: . albuterol  2.5 mg Nebulization BID  . chlorhexidine gluconate (MEDLINE KIT)  15 mL Mouth Rinse BID  . Chlorhexidine Gluconate Cloth  6 each Topical Q0600  . feeding supplement (VITAL HIGH PROTEIN)  1,000 mL Per Tube Q24H  . furosemide  40 mg Intravenous Q8H  . gabapentin  300 mg Per Tube Q24H  . insulin aspart  0-15 Units  Subcutaneous Q4H  . insulin glargine  20 Units Subcutaneous Q24H  . mouth rinse  15 mL Mouth Rinse 10 times per day  . polyethylene glycol  17 g Oral Daily  . potassium chloride  40 mEq Per Tube BID  . sennosides  5 mL Per Tube Daily  . sodium chloride flush  10-40 mL Intracatheter Q12H   Continuous Infusions: . ceFEPime (MAXIPIME) IV 2 g (10/17/18 0521)  . dextrose 5 % and 0.45% NaCl Stopped (10/15/18 2244)  . famotidine (PEPCID) IV 20 mg (10/17/18 1044)  . heparin 900 Units/hr (10/17/18 1255)  . insulin Stopped (10/16/18 0034)  . norepinephrine (LEVOPHED) Adult infusion Stopped (10/17/18 1249)   PRN Meds: albuterol, docusate, fentaNYL (SUBLIMAZE) injection, midazolam, sodium chloride flush  Allergies:    Allergies  Allergen Reactions  . Penicillins Rash    Has patient had a PCN reaction causing immediate rash, facial/tongue/throat swelling, SOB or lightheadedness with hypotension: Yes Has  patient had a PCN reaction causing severe rash involving mucus membranes or skin necrosis: No Has patient had a PCN reaction that required hospitalization No Has patient had a PCN reaction occurring within the last 10 years: No If all of the above answers are "NO", then may proceed with Cephalosporin use.   REACTION: rash  . Ciprofloxacin     nausea  . Ibuprofen Rash    swelling in leg    Social History:   Social History   Socioeconomic History  . Marital status: Married    Spouse name: Not on file  . Number of children: Not on file  . Years of education: Not on file  . Highest education level: Not on file  Occupational History  . Not on file  Social Needs  . Financial resource strain: Not on file  . Food insecurity:    Worry: Not on file    Inability: Not on file  . Transportation needs:    Medical: Not on file    Non-medical: Not on file  Tobacco Use  . Smoking status: Current Every Day Smoker    Packs/day: 0.75    Types: Cigarettes    Start date: 10/23/1978  .  Smokeless tobacco: Never Used  . Tobacco comment: 1/2 pack - trying to cut back   Substance and Sexual Activity  . Alcohol use: No    Alcohol/week: 0.0 standard drinks  . Drug use: No  . Sexual activity: Not on file  Lifestyle  . Physical activity:    Days per week: Not on file    Minutes per session: Not on file  . Stress: Not on file  Relationships  . Social connections:    Talks on phone: Not on file    Gets together: Not on file    Attends religious service: Not on file    Active member of club or organization: Not on file    Attends meetings of clubs or organizations: Not on file    Relationship status: Not on file  . Intimate partner violence:    Fear of current or ex partner: Not on file    Emotionally abused: Not on file    Physically abused: Not on file    Forced sexual activity: Not on file  Other Topics Concern  . Not on file  Social History Narrative  . Not on file    Family History:   Family History  Problem Relation Age of Onset  . Hypertension Mother   . Diabetes Mother   . Hypertension Father   . Diabetes Father     ROS:  Performed and all others negative.     Physical Exam/Data:   Vitals:   10/17/18 0900 10/17/18 1147 10/17/18 1202 10/17/18 1300  BP: 115/67 105/77  103/75  Pulse: 89 90  92  Resp: 20 (!) 22  18  Temp:   98.6 F (37 C)   TempSrc:   Oral   SpO2: 95% 96%  94%  Weight:      Height:        Intake/Output Summary (Last 24 hours) at 10/17/2018 1352 Last data filed at 10/17/2018 1150 Gross per 24 hour  Intake 1821.87 ml  Output 2153 ml  Net -331.13 ml   Filed Weights   10/15/18 0800 10/16/18 0401 10/17/18 0100  Weight: 99.8 kg 101.4 kg 101.2 kg   Body mass index is 46.63 kg/m.   General: Obese female in no acute distress HENT: Normocephalic, atraumatic, moist mucus membranes,  endotracheal tube in tracheostomy stoma, unable to appreciate JVD due to thickness of neck  Pulm: Good air movement with rhonchi throughout  CV:  Distant heart sounds but RRR, no murmurs, no rubs  Abdomen: Active bowel sounds, soft, non-distended, no tenderness to palpation  Extremities: Pulses palpable in all extremities, mild pitting edema of the bilaterally LEs  Skin: Warm and dry, multiple erythematous, blanching macules on the LEs  Neuro: Alert and oriented x 3  EKG:  The EKG was personally reviewed and demonstrates: NSR with normal axis and intervals.   Telemetry:  Telemetry was personally reviewed and demonstrates: NSR  Relevant CV Studies:  Left Heart Cath 03/24/2018   Severe native coronary artery disease with total occlusion of the proximal LAD, total occlusion of the proximal RCA, and patent circumflex with patent proximal stent with eccentric 50% proximal narrowing.  Distal obtuse marginal branches are severely and diffusely diseased.  Bypass graft failure with total occlusion of SVG to RCA.  Patent LIMA to LAD.  Right coronary territory is supplied by collaterals from LAD and circumflex.  Left ventricular systolic dysfunction with EF 35 to 45%.  Mid to distal inferior wall akinesis.  Inferobasal and inferoapical wall severely hypokinetic.  Left ventricular end-diastolic pressure elevated at 19 mmHg  Moderate to severe pulmonary hypertension  Echocardiogram 10/16/2018   1. The left ventricle has not assessed. The cavity size was normal. There is no increased left ventricular wall thickness. Left ventricular diastology could not be evaluated due to nondiagnostic images.  2. The right ventricle has not been assessed systolic function. The cavity was mildly enlarged. There is not assessed.  3. Left atrial size was mildly dilated.  4. The mitral valve is normal in structure. There is mild thickening.  5. The tricuspid valve is normal in structure.  6. The aortic valve is tricuspid There is mild thickening and mild calcification of the aortic valve.  7. The pulmonic valve was grossly normal. Pulmonic valve regurgitation  is mild by color flow Doppler.  8. The inferior vena cava was dilated in size with <50% respiratory variability.  9. Extremely poor acoustic windows limit study LVEF appears severely depressed with inferior, inferoseptal akinesis; hypoknesis elsewhere This is different from echo report of July 2019. 10. RV is not seen well enough to evaluate function. 11. The interatrial septum was not assessed.  Laboratory Data:  Chemistry Recent Labs  Lab 10/15/18 2144 10/16/18 0526 10/16/18 1408 10/17/18 0324  NA 134* 137 134* 138  K 3.6 5.2* 4.4 4.3  CL 97* 101  --  103  CO2 25 22  --  26  GLUCOSE 154* 216*  --  209*  BUN 14 16  --  24*  CREATININE 0.94 1.04*  --  0.91  CALCIUM 7.1* 7.4*  --  7.7*  GFRNONAA >60 >60  --  >60  GFRAA >60 >60  --  >60  ANIONGAP 12 14  --  9    Recent Labs  Lab 10/14/18 2247  PROT 8.1  ALBUMIN 3.5  AST 38  ALT 27  ALKPHOS 133*  BILITOT 0.8   Hematology Recent Labs  Lab 10/14/18 2247  10/15/18 1126 10/16/18 1408 10/17/18 0324  WBC 13.4*  --  26.5*  --  13.9*  RBC 5.71*  --  4.61  --  4.02  HGB 18.3*   < > 14.7 12.9 13.1  HCT 55.9*   < > 44.9 38.0 40.5  MCV 97.9  --  97.4  --  100.7*  MCH 32.0  --  31.9  --  32.6  MCHC 32.7  --  32.7  --  32.3  RDW 13.2  --  13.5  --  14.0  PLT 254  --  265  --  177   < > = values in this interval not displayed.   Cardiac Enzymes Recent Labs  Lab 10/14/18 2247  TROPONINI <0.03   No results for input(s): TROPIPOC in the last 168 hours.  BNP Recent Labs  Lab 10/14/18 2248  BNP 396.0*    DDimer No results for input(s): DDIMER in the last 168 hours.  Radiology/Studies:  Dg Abd 1 View  Result Date: 10/16/2018 CLINICAL DATA:  Evaluate feeding tube placement EXAM: ABDOMEN - 1 VIEW COMPARISON:  None. FINDINGS: The feeding tube terminates just to the right of midline, likely in the distal stomach. IMPRESSION: The feeding tube terminates either in the distal gastric body or the proximal antrum. Recommend  repositioning/advancement before use. Electronically Signed   By: Dorise Bullion III M.D   On: 10/16/2018 15:48   Dg Chest Port 1 View  Result Date: 10/17/2018 CLINICAL DATA:  Respiratory failure. EXAM: PORTABLE CHEST 1 VIEW COMPARISON:  Radiograph of October 15, 2018. FINDINGS: Stable cardiomegaly. Tracheostomy tube is unchanged in position. Interval placement of feeding tube with distal tip in expected position of stomach. No pneumothorax is noted. Central pulmonary vascular congestion is noted with possible bilateral pulmonary edema or possibly pneumonia. Minimal pleural effusions are noted. Bony thorax is unremarkable. IMPRESSION: Stable cardiomegaly with central pulmonary vascular congestion. Interval placement of feeding tube with distal tip in expected position of stomach. Stable bilateral lung opacities are noted concerning for edema or possibly pneumonia. Electronically Signed   By: Marijo Conception, M.D.   On: 10/17/2018 07:26   Dg Chest Port 1 View  Result Date: 10/15/2018 CLINICAL DATA:  Tracheostomy tube EXAM: PORTABLE CHEST 1 VIEW COMPARISON:  Chest radiograph 10/15/2018 FINDINGS: Tracheostomy tube terminates in the mid trachea. Monitoring leads overlie the patient. Stable cardiomegaly status post median sternotomy. Similar-appearing right-greater-than-left patchy consolidation interstitial opacities. Small bilateral pleural effusions. Postsurgical changes right lower hemithorax. No pneumothorax. Thoracic spine degenerative changes. IMPRESSION: Tracheostomy tube terminates in the mid trachea. Similar-appearing bilateral patchy areas of consolidation and interstitial opacities which may represent edema and/or infection. Small bilateral effusions. Electronically Signed   By: Lovey Newcomer M.D.   On: 10/15/2018 14:45   Dg Chest Port 1 View  Result Date: 10/15/2018 CLINICAL DATA:  Endotracheal tube placement. EXAM: PORTABLE CHEST 1 VIEW COMPARISON:  Earlier film, same date. FINDINGS: The  endotracheal tube has been retracted. It is 12 mm above the carina and could be retracted further. Interval re-aeration of the left lung. Persistent diffuse but asymmetric interstitial and airspace process, likely pulmonary edema. Suspect small effusions. IMPRESSION: 1. The endotracheal tube is 12 mm above the carina and could be retracted another 2 or 3 cm. 2. Re-aeration of the left lung. 3. Persistent diffuse but asymmetric interstitial and airspace process, likely pulmonary edema. Electronically Signed   By: Marijo Sanes M.D.   On: 10/15/2018 10:33   Dg Chest Port 1 View  Result Date: 10/15/2018 CLINICAL DATA:  Status post CPR. EXAM: PORTABLE CHEST 1 VIEW COMPARISON:  10/14/2018 FINDINGS: The endotracheal tube is in the right mainstem bronchus and needs to be retracted approximately 5 cm. Associated complete opacification of the left hemithorax. Persistent diffuse interstitial and airspace process likely pulmonary edema. Small bilateral pleural effusions. Surgical changes noted at the  right lung base. IMPRESSION: The endotracheal tube is in the right mainstem bronchus and needs to be retracted approximately 5 cm. Associated complete opacification of the left hemithorax. Persistent underlying diffuse interstitial and airspace process, likely edema. These results were called by telephone at the time of interpretation on 10/15/2018 at 4:41 am to [place] in the ICU , who verbally acknowledged these results. Electronically Signed   By: Marijo Sanes M.D.   On: 10/15/2018 04:41   Dg Chest Port 1 View  Result Date: 10/14/2018 CLINICAL DATA:  Respiratory distress EXAM: PORTABLE CHEST 1 VIEW COMPARISON:  03/22/2018 chest radiograph. FINDINGS: Tracheostomy tube terminates over the tracheal air column at the level of the thoracic inlet. Intact sternotomy wires. Stable cardiomediastinal silhouette with mild cardiomegaly. No pneumothorax. No pleural effusion. Diffuse fluffy opacity throughout both lungs, worsened.  IMPRESSION: Stable mild cardiomegaly. Diffuse fluffy opacity throughout both lungs, favor moderate to severe pulmonary edema due to congestive heart failure. Electronically Signed   By: Ilona Sorrel M.D.   On: 10/14/2018 23:07   Assessment and Plan:   AMEY HOSSAIN is a 53 y.o. female with a hx of COPD s/p tracheostomy in 2002, insulin dependent DM, CAD s/p CABG, and Pulm HTN who initially presented with acute hypoxic respiratory failure and DKA. She was subsequently intubated, aggressively hydrated, started on an insulin ggt, and put on pressors. She has significantly improved with resolution of her DKA and she is now off pressors; however, repeat echocardiogram illustrated acute worsening of her LVEF. Given that she is slow to wean from the vent, her reduction in LVEF, and her pulmonary edema cardiology was consulted for further evaluation.   Acute on Chronic HFrEF CAD s/p CABG  Type 2 Pulmonary HTN - Echocardiogram on 2/10 with reduced LVEF compared to prior echocardiogram on 03/25/2018. Left heart cath on 03/24/2018 with estimated LVEF of 35-40% - Volume status difficult to assess due to body habitus and on vent  - CXR reviewed. Significant pulmonary edema with fluid in the right major fissure  - Net positive 9.7L since admission  - Continue diureses with IV furosemide 40 mg TID  - Once medically stable, off pressors >48 hours, can begin to restart medical therapy for HF (metoprolol 50 mg BID, Lisinopril 20 mg).  - Suspect that her LVEF may be acute lower due to the acute illness in the setting of known CAD. Would recommend diuresing then optimizing medication and repeat echocardiogram as outpatient.   For questions or updates, please contact [place] Please consult [email or web address] for contact info under Cardiology/STEMI.   Signed, Ina Homes, MD 10/17/2018 1:52 PM   I have examined the patient and reviewed assessment and plan and discussed with patient.  Agree with above as  stated.    I personally reviewed her cath films from 2019.  She had inferior hypokinesis at that time.  Given her cardiac arrest, her myocardium is likely stunned and her EF may be lower than prior.   COntinue with diuresis and medical therapy for systolic dysfunction along with supportive care.   No plans for invasive testing at this time.   Larae Grooms

## 2018-10-17 NOTE — Progress Notes (Signed)
ANTICOAGULATION CONSULT NOTE  Pharmacy Consult:  Heparin Indication:  Lupus anticoagulant and history of DVT  Allergies  Allergen Reactions  . Penicillins Rash    Has patient had a PCN reaction causing immediate rash, facial/tongue/throat swelling, SOB or lightheadedness with hypotension: Yes Has patient had a PCN reaction causing severe rash involving mucus membranes or skin necrosis: No Has patient had a PCN reaction that required hospitalization No Has patient had a PCN reaction occurring within the last 10 years: No If all of the above answers are "NO", then may proceed with Cephalosporin use.   REACTION: rash  . Ciprofloxacin     nausea  . Ibuprofen Rash    swelling in leg     Patient Measurements: Height: 4\' 10"  (147.3 cm) Weight: 223 lb 1.7 oz (101.2 kg) IBW/kg (Calculated) : 40.9 Heparin Dosing Weight: 66 kg  Vital Signs: Temp: 98.2 F (36.8 C) (02/11 1531) Temp Source: Oral (02/11 1531) BP: 112/79 (02/11 1800) Pulse Rate: 69 (02/11 1800)  Labs: Recent Labs    10/14/18 2247  10/15/18 1126  10/15/18 2144 10/16/18 0526 10/16/18 1408 10/17/18 0324 10/17/18 1258 10/17/18 1813  HGB 18.3*   < > 14.7  --   --   --  12.9 13.1  --   --   HCT 55.9*   < > 44.9  --   --   --  38.0 40.5  --   --   PLT 254  --  265  --   --   --   --  177  --   --   LABPROT  --   --  35.9*  --   --   --   --  20.2*  --   --   INR  --   --  3.66  --   --   --   --  1.74  --   --   HEPARINUNFRC  --   --   --   --   --   --   --   --   --  0.23*  CREATININE 0.91   < > 1.42*   < > 0.94 1.04*  --  0.91  --   --   TROPONINI <0.03  --   --   --   --   --   --   --  6.18*  --    < > = values in this interval not displayed.    Estimated Creatinine Clearance: 74.2 mL/min (by C-G formula based on SCr of 0.91 mg/dL).  Assessment: 53 year old female on warfarin PTA for history of a DVT and lupus anticoagulant disorder. INR on admission was elevated at 3.99, but has now trended down to  sub-therapeutic level.  Pharmacy consulted to dose IV heparin.   Heparin level is slightly sub-therapeutic; no bleeding reported.  Noted she was previously therapeutic on 1100 units/hr.  Goal of Therapy:  Heparin level 0.3-0.7 units/ml Monitor platelets by anticoagulation protocol: Yes   Plan:  Increase heparin gtt to 1100 units/hr F/U AM labs   Dorinda Stehr D. Mina Marble, PharmD, BCPS, [place] 10/17/2018, 7:08 PM

## 2018-10-17 NOTE — Progress Notes (Addendum)
ANTICOAGULATION CONSULT NOTE - Initial Consult  Pharmacy Consult for IV Heparin Indication: Lupus anticoagulant and history of a DVT (on Warfarin prior to admission)  Allergies  Allergen Reactions  . Penicillins Rash    Has patient had a PCN reaction causing immediate rash, facial/tongue/throat swelling, SOB or lightheadedness with hypotension: Yes Has patient had a PCN reaction causing severe rash involving mucus membranes or skin necrosis: No Has patient had a PCN reaction that required hospitalization No Has patient had a PCN reaction occurring within the last 10 years: No If all of the above answers are "NO", then may proceed with Cephalosporin use.   REACTION: rash  . Ciprofloxacin     nausea  . Ibuprofen Rash    swelling in leg     Patient Measurements: Height: 4\' 10"  (147.3 cm) Weight: 223 lb 1.7 oz (101.2 kg) IBW/kg (Calculated) : 40.9 Heparin Dosing Weight: 65.7  Vital Signs: Temp: 98.8 F (37.1 C) (02/11 0804) Temp Source: Oral (02/11 0804) BP: 105/77 (02/11 1147) Pulse Rate: 90 (02/11 1147)  Labs: Recent Labs    10/14/18 2247  10/15/18 1126  10/15/18 2144 10/16/18 0526 10/16/18 1408 10/17/18 0324  HGB 18.3*   < > 14.7  --   --   --  12.9 13.1  HCT 55.9*   < > 44.9  --   --   --  38.0 40.5  PLT 254  --  265  --   --   --   --  177  LABPROT  --   --  35.9*  --   --   --   --  20.2*  INR  --   --  3.66  --   --   --   --  1.74  CREATININE 0.91   < > 1.42*   < > 0.94 1.04*  --  0.91  TROPONINI <0.03  --   --   --   --   --   --   --    < > = values in this interval not displayed.    Estimated Creatinine Clearance: 74.2 mL/min (by C-G formula based on SCr of 0.91 mg/dL).   Medical History: Past Medical History:  Diagnosis Date  . CAD (coronary artery disease)    a. s/p prior PCI. b. CABG 2007 at [facility] in [place] 2007. c. inferior STEMI 10/2015 s/p DES to dSVG-PDA.  Marland Kitchen Cervical cancer ([place])   . Chronic diastolic CHF (congestive heart  failure) ([place])   . Chronic respiratory failure ([place])    s/p tracheostomy 2002  . Chronic RUQ pain   . COPD (chronic obstructive pulmonary disease) ([place])   . Diabetes mellitus ([place])   . DVT (deep venous thrombosis) ([place])   . Endometriosis   . History of gallstones 01/2016   seen on Ultrasound  . History of HIDA scan 11/2016   normal  . HTN (hypertension)   . Hyperlipidemia   . Lupus anticoagulant disorder (H[facility])    on coumadin  . Morbid obesity ([place])   . ST elevation (STEMI) myocardial infarction involving right coronary artery ([place]) 10/29/15   stent to VG to PDA  . Toe fracture, right 03/29/2018  . Tracheostomy in place [facility]), chronic since 2002 11/03/2015   Assessment: 53 year old female on warfarin prior to admission for history of a DVT and lupus anticoagulant disorder (INR goal of 2-3). INR on admission was elevated at 3.99. INR has now trended down off therapy to 1.74. Pharmacy  consulted to dose IV Heparin therapy while warfarin remains on hold. Hemoglobin is stable. Platelets have trended down some (254>>265>>177) so will monitor. Subcutaneous heparin was discontinued due to elevated INR initially. No bleeding is noted.    Goal of Therapy:  Heparin level 0.3-0.7 units/ml Monitor platelets by anticoagulation protocol: Yes   Plan:  Start IV Heparin (no bolus due to elevated INR) at rate of 900 units/hr. Draw Heparin level in 6 hours after therapy is initiated.  Daily Heparin level and CBC while on therapy.  Daily PT/INR Follow-up ability to resume home Warfarin therapy  Sloan Leiter, PharmD, BCPS, BCCCP Clinical Pharmacist Please refer to [facility] for [place] numbers 10/17/2018,11:51 AM

## 2018-10-17 NOTE — Progress Notes (Signed)
..   NAME:  Theresa [name], MRN:  [id number], DOB:  12-Sep-1965, LOS: 2 ADMISSION DATE:  10/14/2018, CONSULTATION DATE:  10/15/2018 REFERRING MD:  Vanita Panda MD, CHIEF COMPLAINT:  Shortness of Breath   Brief History    53 yr old female w/ PMHx sig for COPD s/p tracheostomy in 2002(6 metal [place]), IDDM, HTN, CAD (July 2019 with pulm htn as well),STEMI, tobacco use, cervical cancer and DVT  That presented on 10/14/2018 via [facility]. Initial triage documentation states that pt was very short of breath despite tracheostomy appearing in place.  ABG was drawn when pt was placed on 98% trach collar along with 100% NRB ABG:7.286/46/66/22 Patient had suctioning, irrigation, flushing of her tracheostomy and a treatment which resulted in some improved respiratory status, though only with multiple breathing treatments, continued suctioning the patient have saturation in the 90% range.  She also received empiric antibiotics due to her CXR. BMET - showed elevated BG with AG--> DKA PCCM asked to admit     Significant Hospital Events   2/9 Resp distress after trach care, PEA arrest due to hypoxia, jackson trach removed and ET tube inserted through the stoma. S/p trach and central line placemebnt. On epinephrine and vasopressin. Continues on insulin drip. Awake, talking on the vent.   2/10 - coming down on pressors. On vasop and levophed 66mcg only. Down to 50% fio2. On lot of fluids. Following commands. Writing saying her left foot neuropathy is moderately bad and was initiating complaint. Home gabapentin on hold and restarted at low dose. A line not working well. Trach fine and on vent  Consults:    Procedures:    Significant Diagnostic Tests:  CXR FINDINGS: Tracheostomy tube terminates over the tracheal air column at the level of the thoracic inlet. Intact sternotomy wires. Stable cardiomediastinal silhouette with mild cardiomegaly. No pneumothorax. No pleural effusion. Diffuse fluffy opacity  throughout both lungs, worsened.  IMPRESSION: Stable mild cardiomegaly. Diffuse fluffy opacity throughout both lungs, favor moderate to severe pulmonary edema due to congestive heart failure.  Micro Data:  2/9-blood culture 2/9 - rvp neg 2/8 - hiv neg  Antimicrobials:  2/9- Vanco > 2/10 2/9- Azactam > 2/10 2/10 - cefepime   Interim history/subjective:   2/11 - echo yesterday poor [place] but appears LVEF is severely depressed; new since July 2019. +9.7L  - despite 1 dose lasix yesterday. Off vasopressin. Culture negative so far. INR 1.74  Objective   Blood pressure 115/67, pulse 89, temperature 98.8 F (37.1 C), temperature source Oral, resp. rate 20, height 4\' 10"  (1.473 m), weight 101.2 kg, SpO2 95 %.    Vent Mode: PSV;CPAP FiO2 (%):  [40 %-50 %] 50 % Set Rate:  [18 bmp] 18 bmp Vt Set:  [320 mL-500 mL] 320 mL PEEP:  [5 cmH20] 5 cmH20 Pressure Support:  [10 cmH20] 10 cmH20 Plateau Pressure:  [33 cmH20] 33 cmH20   Intake/Output Summary (Last 24 hours) at 10/17/2018 1104 Last data filed at 10/17/2018 1032 Gross per 24 hour  Intake 1904.21 ml  Output 2268 ml  Net -363.79 ml   Filed Weights   10/15/18 0800 10/16/18 0401 10/17/18 0100  Weight: 99.8 kg 101.4 kg 101.2 kg    General Appearance:  Looks better. Obese. Short Head:  Normocephalic, without obvious abnormality, atraumatic Eyes:  PERRL - yes, conjunctiva/corneas - yes     Ears:  Normal external ear canals, both ears Nose:  G tube - yes Throat:  ETT TUBE - no. TRACH + . SITE  CLEAN + , OG tube - no Neck:  Supple,  No enlargement/tenderness/nodules Lungs: Clear to auscultation bilaterally, Ventilator   Synchrony - yes. Doing PS/CPAP Heart:  S1 and S2 normal, no murmur, CVP - no.  Pressors - almost off levophed Abdomen:  Soft, no masses, no organomegaly Genitalia / Rectal:  Not done Extremities:  Extremities- intact Skin:  ntact in exposed areas . Psoriasis + Neurologic:  Sedation - fent gtt -> RASS - +1 .  Moves all 4s - yes. CAM-ICU - neg . Orientation - x3+        LABS    PULMONARY Recent Labs  Lab 10/14/18 2313  10/15/18 0458 10/15/18 0527 10/15/18 0550 10/15/18 1111 10/16/18 1408  PHART 7.286*  --  7.233*  --  7.271* 7.402 7.365  PCO2ART 46.9  --  51.8*  --  45.5 36.1 49.7*  PO2ART 66.0*  --  75.0*  --  62.0* 89.0 75.0*  HCO3 22.3   < > 21.9 21.2 21.0 22.4 28.4*  TCO2 24   < > 23 22 22 24 30   O2SAT 90.0   < > 92.0 85.0 88.0 97.0 94.0   < > = values in this interval not displayed.    CBC Recent Labs  Lab 10/14/18 2247  10/15/18 1126 10/16/18 1408 10/17/18 0324  HGB 18.3*   < > 14.7 12.9 13.1  HCT 55.9*   < > 44.9 38.0 40.5  WBC 13.4*  --  26.5*  --  13.9*  PLT 254  --  265  --  177   < > = values in this interval not displayed.    COAGULATION Recent Labs  Lab 10/15/18 1126 10/17/18 0324  INR 3.66 1.74    CARDIAC   Recent Labs  Lab 10/14/18 2247  TROPONINI <0.03   No results for input(s): PROBNP in the last 168 hours.   CHEMISTRY Recent Labs  Lab 10/15/18 0533  10/15/18 1126 10/15/18 1625 10/15/18 2144 10/16/18 0526 10/16/18 1136 10/16/18 1408 10/17/18 0324  NA 146*   < > 146* 143 134* 137  --  134* 138  K 2.5*   < > 2.3* 3.5 3.6 5.2*  --  4.4 4.3  CL 102  --  98 103 97* 101  --   --  103  CO2 19*  --  27 28 25 22   --   --  26  GLUCOSE 768*  --  357* 144* 154* 216*  --   --  209*  BUN 14  --  14 15 14 16   --   --  24*  CREATININE 1.54*  --  1.42* 1.05* 0.94 1.04*  --   --  0.91  CALCIUM 8.2*  --  8.3* 7.6* 7.1* 7.4*  --   --  7.7*  MG 2.7*  --  1.8  --   --   --  1.5*  --  1.7  PHOS 2.2*  --  <1.0*  --  2.5  --  4.0  --  3.9   < > = values in this interval not displayed.   Estimated Creatinine Clearance: 74.2 mL/min (by C-G formula based on SCr of 0.91 mg/dL).   LIVER Recent Labs  Lab 10/14/18 2247 10/15/18 1126 10/17/18 0324  AST 38  --   --   ALT 27  --   --   ALKPHOS 133*  --   --   BILITOT 0.8  --   --   PROT 8.1  --    --  ALBUMIN 3.5  --   --   INR  --  3.66 1.74     INFECTIOUS Recent Labs  Lab 10/16/18 0526 10/16/18 1136 10/16/18 1428 10/17/18 0324  LATICACIDVEN 3.2* 3.0* 2.0*  --   PROCALCITON  --  1.99  --  1.59     ENDOCRINE CBG (last 3)  Recent Labs    10/16/18 2348 10/17/18 0428 10/17/18 0751  GLUCAP 188* 194* 188*         IMAGING x48h  - image(s) personally visualized  -   highlighted in bold Dg Abd 1 View  Result Date: 10/16/2018 CLINICAL DATA:  Evaluate feeding tube placement EXAM: ABDOMEN - 1 VIEW COMPARISON:  None. FINDINGS: The feeding tube terminates just to the right of midline, likely in the distal stomach. IMPRESSION: The feeding tube terminates either in the distal gastric body or the proximal antrum. Recommend repositioning/advancement before use. Electronically Signed   By: Dorise Bullion III M.D   On: 10/16/2018 15:48   Dg Chest Port 1 View  Result Date: 10/17/2018 CLINICAL DATA:  Respiratory failure. EXAM: PORTABLE CHEST 1 VIEW COMPARISON:  Radiograph of October 15, 2018. FINDINGS: Stable cardiomegaly. Tracheostomy tube is unchanged in position. Interval placement of feeding tube with distal tip in expected position of stomach. No pneumothorax is noted. Central pulmonary vascular congestion is noted with possible bilateral pulmonary edema or possibly pneumonia. Minimal pleural effusions are noted. Bony thorax is unremarkable. IMPRESSION: Stable cardiomegaly with central pulmonary vascular congestion. Interval placement of feeding tube with distal tip in expected position of stomach. Stable bilateral lung opacities are noted concerning for edema or possibly pneumonia. Electronically Signed   By: Marijo Conception, M.D.   On: 10/17/2018 07:26   Dg Chest Port 1 View  Result Date: 10/15/2018 CLINICAL DATA:  Tracheostomy tube EXAM: PORTABLE CHEST 1 VIEW COMPARISON:  Chest radiograph 10/15/2018 FINDINGS: Tracheostomy tube terminates in the mid trachea. Monitoring leads  overlie the patient. Stable cardiomegaly status post median sternotomy. Similar-appearing right-greater-than-left patchy consolidation interstitial opacities. Small bilateral pleural effusions. Postsurgical changes right lower hemithorax. No pneumothorax. Thoracic spine degenerative changes. IMPRESSION: Tracheostomy tube terminates in the mid trachea. Similar-appearing bilateral patchy areas of consolidation and interstitial opacities which may represent edema and/or infection. Small bilateral effusions. Electronically Signed   By: Lovey Newcomer M.D.   On: 10/15/2018 14:45     Assessment & Plan:  Acute respiratory failure with hypoxia secondary to dislodged trach - treated emergently with ET tube via stoma and later converstion to PDT 10/15/2018    -10/17/2018 - > doin PSV but not ready for full liberation due to volume overload  And sCHF - new acute dx   PLan =  Start scheduled lasix  - full vent support; wean as tolerated   Cardiac arrest, had a V. tach arrest x3 and a PEA arrest Secondary to hypoxia while trying to secure airway  10/17/2018 - almost off pressors  PLAN MAP goal > 65 DC a-line  Hx of gr3 diast dysfn in July 2019 but echo 10/16/2018 with severe low LVEF - call cards consult  - increase diuresis  DKA  .10/17/2018 - gap closed, bic normal as of 10/16/2018  Plan  -hyperglycemia protocll    Possible sepsis - culture negative 10/17/2018 - continue cefepime for now    PAIN -neuropathy of LLE  - restarted 1/3rd of home dose neurontin on 2/10/202 - pain better 10/17/2018  AKI  - resolved  - no ace inhibitor (a home med)  Hx of DV 2010 x 1 and Lupus anticoag positive per note with INR goal 2-3 per patient -start IV heparin 10/17/2018    Best practice:  Diet: start TF Pain/Anxiety/Delirium protocol (if indicated):dc fent gt. Changed to PRn fent/versed prn VAP protocol (if indicated): yes post intubation DVT prophylaxis:scd - awwait INR GI prophylaxis: Pepcid  IV Glucose control: insulin gtt with stabilizer Mobility: continuous bedrest Q 2 turn Code Status: FULL Family Communication: husband and patient Disposition: ICU    ATTESTATION & SIGNATURE   The patient Theresa [name] is critically ill with multiple organ systems failure and requires high complexity decision making for assessment and support, frequent evaluation and titration of therapies, application of advanced monitoring technologies and extensive interpretation of multiple databases.   Critical Care Time devoted to patient care services described in this note is  30  Minutes. This time reflects time of care of this signee Dr Brand Males. This critical care time does not reflect procedure time, or teaching time or supervisory time of PA/NP/Med student/Med Resident etc but could involve care discussion time     Dr. Brand Males, M.D., [facility].C.P Pulmonary and Critical Care Medicine Staff Physician [place] [facility] Pager: [phone], If no answer or between  15:00h - 7:00h: call [phone]  10/17/2018 11:53 AM

## 2018-10-18 ENCOUNTER — Inpatient Hospital Stay (HOSPITAL_COMMUNITY): Payer: Medicare Other

## 2018-10-18 DIAGNOSIS — J9622 Acute and chronic respiratory failure with hypercapnia: Secondary | ICD-10-CM

## 2018-10-18 DIAGNOSIS — I248 Other forms of acute ischemic heart disease: Secondary | ICD-10-CM

## 2018-10-18 DIAGNOSIS — J9621 Acute and chronic respiratory failure with hypoxia: Secondary | ICD-10-CM

## 2018-10-18 DIAGNOSIS — R7989 Other specified abnormal findings of blood chemistry: Secondary | ICD-10-CM

## 2018-10-18 LAB — CBC
HCT: 42.8 % (ref 36.0–46.0)
Hemoglobin: 13.2 g/dL (ref 12.0–15.0)
MCH: 31.6 pg (ref 26.0–34.0)
MCHC: 30.8 g/dL (ref 30.0–36.0)
MCV: 102.4 fL — ABNORMAL HIGH (ref 80.0–100.0)
Platelets: 152 10*3/uL (ref 150–400)
RBC: 4.18 MIL/uL (ref 3.87–5.11)
RDW: 13.7 % (ref 11.5–15.5)
WBC: 10.6 10*3/uL — AB (ref 4.0–10.5)
nRBC: 0 % (ref 0.0–0.2)

## 2018-10-18 LAB — BASIC METABOLIC PANEL
ANION GAP: 4 — AB (ref 5–15)
Anion gap: 11 (ref 5–15)
BUN: 27 mg/dL — ABNORMAL HIGH (ref 6–20)
BUN: 28 mg/dL — ABNORMAL HIGH (ref 6–20)
CALCIUM: 8.4 mg/dL — AB (ref 8.9–10.3)
CO2: 34 mmol/L — ABNORMAL HIGH (ref 22–32)
CO2: 31 mmol/L (ref 22–32)
Calcium: 8.2 mg/dL — ABNORMAL LOW (ref 8.9–10.3)
Chloride: 101 mmol/L (ref 98–111)
Chloride: 97 mmol/L — ABNORMAL LOW (ref 98–111)
Creatinine, Ser: 1.1 mg/dL — ABNORMAL HIGH (ref 0.44–1.00)
Creatinine, Ser: 0.99 mg/dL (ref 0.44–1.00)
GFR calc non Af Amer: 58 mL/min — ABNORMAL LOW (ref >60)
GFR calc non Af Amer: >60 mL/min (ref >60)
Glucose, Bld: 255 mg/dL — ABNORMAL HIGH (ref 70–99)
Glucose, Bld: 229 mg/dL — ABNORMAL HIGH (ref 70–99)
Potassium: 5.7 mmol/L — ABNORMAL HIGH (ref 3.5–5.1)
Potassium: 4.8 mmol/L (ref 3.5–5.1)
Sodium: 139 mmol/L (ref 135–145)
Sodium: 139 mmol/L (ref 135–145)

## 2018-10-18 LAB — MAGNESIUM: MAGNESIUM: 2 mg/dL (ref 1.7–2.4)

## 2018-10-18 LAB — GLUCOSE, CAPILLARY
GLUCOSE-CAPILLARY: 109 mg/dL — AB (ref 70–99)
Glucose-Capillary: 135 mg/dL — ABNORMAL HIGH (ref 70–99)
Glucose-Capillary: 192 mg/dL — ABNORMAL HIGH (ref 70–99)
Glucose-Capillary: 221 mg/dL — ABNORMAL HIGH (ref 70–99)
Glucose-Capillary: 240 mg/dL — ABNORMAL HIGH (ref 70–99)
Glucose-Capillary: 268 mg/dL — ABNORMAL HIGH (ref 70–99)
Glucose-Capillary: 282 mg/dL — ABNORMAL HIGH (ref 70–99)

## 2018-10-18 LAB — HEPARIN LEVEL (UNFRACTIONATED)
HEPARIN UNFRACTIONATED: 0.38 [IU]/mL (ref 0.30–0.70)
Heparin Unfractionated: <0.1 IU/mL — ABNORMAL LOW (ref 0.30–0.70)
Heparin Unfractionated: 0.3 IU/mL (ref 0.30–0.70)

## 2018-10-18 LAB — PROTIME-INR
INR: 1.37
Prothrombin Time: 16.7 seconds — ABNORMAL HIGH (ref 11.4–15.2)

## 2018-10-18 LAB — PROCALCITONIN: Procalcitonin: 1.09 ng/mL

## 2018-10-18 LAB — PHOSPHORUS: Phosphorus: 2.4 mg/dL — ABNORMAL LOW (ref 2.5–4.6)

## 2018-10-18 LAB — TROPONIN I: Troponin I: 4.63 ng/mL (ref <0.03)

## 2018-10-18 MED ORDER — INSULIN ASPART 100 UNIT/ML ~~LOC~~ SOLN
0.0000 [IU] | SUBCUTANEOUS | Status: DC
  Administered 2018-10-18: 4 [IU] via SUBCUTANEOUS
  Administered 2018-10-18: 7 [IU] via SUBCUTANEOUS
  Administered 2018-10-18: 3 [IU] via SUBCUTANEOUS

## 2018-10-18 MED ORDER — FUROSEMIDE 10 MG/ML IJ SOLN
40.0000 mg | Freq: Two times a day (BID) | INTRAMUSCULAR | Status: DC
  Administered 2018-10-19: 40 mg via INTRAVENOUS
  Filled 2018-10-18: qty 4

## 2018-10-18 MED ORDER — INSULIN ASPART 100 UNIT/ML ~~LOC~~ SOLN
3.0000 [IU] | SUBCUTANEOUS | Status: DC
  Administered 2018-10-18: 3 [IU] via SUBCUTANEOUS

## 2018-10-18 MED ORDER — SODIUM PHOSPHATES 45 MMOLE/15ML IV SOLN
10.0000 mmol | Freq: Once | INTRAVENOUS | Status: AC
  Administered 2018-10-18: 10 mmol via INTRAVENOUS
  Filled 2018-10-18: qty 3.33

## 2018-10-18 MED ORDER — HEPARIN BOLUS VIA INFUSION
1000.0000 [IU] | Freq: Once | INTRAVENOUS | Status: AC
  Administered 2018-10-18: 1000 [IU] via INTRAVENOUS
  Filled 2018-10-18: qty 1000

## 2018-10-18 MED ORDER — INSULIN ASPART 100 UNIT/ML ~~LOC~~ SOLN
3.0000 [IU] | SUBCUTANEOUS | Status: DC
  Administered 2018-10-18 – 2018-10-19 (×3): 3 [IU] via SUBCUTANEOUS

## 2018-10-18 NOTE — Progress Notes (Signed)
[place] Progress Note Patient Name: Theresa [name] DOB: April 19, 1966 MRN: [id number]   Date of Service  10/18/2018  HPI/Events of Note  Notified of K 5.7  eICU Interventions  Discontinue potassium replacement, patient still on furosemide, troponins trending down     Intervention Category Major Interventions: Electrolyte abnormality - evaluation and management  Judd Lien 10/18/2018, 5:44 AM

## 2018-10-18 NOTE — Progress Notes (Addendum)
Progress Note  Patient Name: Theresa [name] Date of Encounter: 10/18/2018  Primary Cardiologist: [name], MD   Subjective   Patient doing well this morning. She continues to have chest pain. Her nausea has improved but is still intermittent. Overall she feels well but think she will require a lot of rest to get back to her normal state of health. Discussed elevated troponin and plan moving forward. All questions and concerns addressed.  Inpatient Medications    Scheduled Meds: . albuterol  2.5 mg Nebulization BID  . chlorhexidine gluconate (MEDLINE KIT)  15 mL Mouth Rinse BID  . Chlorhexidine Gluconate Cloth  6 each Topical Q0600  . feeding supplement (VITAL HIGH PROTEIN)  1,000 mL Per Tube Q24H  . furosemide  40 mg Intravenous Q8H  . gabapentin  300 mg Per Tube Q24H  . insulin aspart  0-15 Units Subcutaneous Q4H  . insulin glargine  20 Units Subcutaneous Q24H  . mouth rinse  15 mL Mouth Rinse 10 times per day  . polyethylene glycol  17 g Oral Daily  . sennosides  5 mL Per Tube Daily  . sodium chloride flush  10-40 mL Intracatheter Q12H   Continuous Infusions: . ceFEPime (MAXIPIME) IV 2 g (10/18/18 0600)  . dextrose 5 % and 0.45% NaCl Stopped (10/15/18 2244)  . famotidine (PEPCID) IV Stopped (10/18/18 0005)  . heparin 1,100 Units/hr (10/18/18 0600)  . insulin Stopped (10/16/18 0034)  . norepinephrine (LEVOPHED) Adult infusion Stopped (10/17/18 1249)   PRN Meds: albuterol, docusate, fentaNYL (SUBLIMAZE) injection, midazolam, sodium chloride flush   Vital Signs    Vitals:   10/18/18 0418 10/18/18 0457 10/18/18 0500 10/18/18 0600  BP:   117/82 124/81  Pulse:   86 91  Resp:   20 (!) 21  Temp: 98.1 F (36.7 C)     TempSrc: Oral     SpO2:   95% 99%  Weight:  101.8 kg    Height:        Intake/Output Summary (Last 24 hours) at 10/18/2018 0738 Last data filed at 10/18/2018 0600 Gross per 24 hour  Intake 1775.52 ml  Output 5035 ml  Net -3259.48 ml   Filed  Weights   10/16/18 0401 10/17/18 0100 10/18/18 0457  Weight: 101.4 kg 101.2 kg 101.8 kg   Telemetry    NSR with NSVT (3 beats) - Personally Reviewed  ECG    NSR - Personally Reviewed  Physical Exam   Today's Vitals   10/18/18 0418 10/18/18 0457 10/18/18 0500 10/18/18 0600  BP:   117/82 124/81  Pulse:   86 91  Resp:   20 (!) 21  Temp: 98.1 F (36.7 C)     TempSrc: Oral     SpO2:   95% 99%  Weight:  101.8 kg    Height:      PainSc:       Body mass index is 46.91 kg/m.  GEN: No acute distress.   Neck: No JVD Cardiac: RRR, no murmurs, rubs, or gallops.  Respiratory: Continues to have bilateral rhonchi but is improved compared to prior. GI: Soft, nontender, non-distended  MS: mild to moderate pitting edema; No deformity. Neuro:  Nonfocal  Psych: Normal affect   Labs    Chemistry Recent Labs  Lab 10/14/18 2247  10/16/18 0526 10/16/18 1408 10/17/18 0324 10/18/18 0319  NA 133*   < > 137 134* 138 139  K 4.5   < > 5.2* 4.4 4.3 5.7*  CL 96*   < >  101  --  103 101  CO2 17*   < > 22  --  26 34*  GLUCOSE 650*   < > 216*  --  209* 255*  BUN 10   < > 16  --  24* 27*  CREATININE 0.91   < > 1.04*  --  0.91 1.10*  CALCIUM 8.8*   < > 7.4*  --  7.7* 8.2*  PROT 8.1  --   --   --   --   --   ALBUMIN 3.5  --   --   --   --   --   AST 38  --   --   --   --   --   ALT 27  --   --   --   --   --   ALKPHOS 133*  --   --   --   --   --   BILITOT 0.8  --   --   --   --   --   GFRNONAA >60   < > >60  --  >60 58*  GFRAA >60   < > >60  --  >60 >60  ANIONGAP 20*   < > 14  --  9 4*   < > = values in this interval not displayed.    Hematology Recent Labs  Lab 10/15/18 1126 10/16/18 1408 10/17/18 0324 10/18/18 0319  WBC 26.5*  --  13.9* 10.6*  RBC 4.61  --  4.02 4.18  HGB 14.7 12.9 13.1 13.2  HCT 44.9 38.0 40.5 42.8  MCV 97.4  --  100.7* 102.4*  MCH 31.9  --  32.6 31.6  MCHC 32.7  --  32.3 30.8  RDW 13.5  --  14.0 13.7  PLT 265  --  177 152   Cardiac Enzymes Recent  Labs  Lab 10/14/18 2247 10/17/18 1258 10/18/18 0319  TROPONINI <0.03 6.18* 4.63*   No results for input(s): TROPIPOC in the last 168 hours.   BNP Recent Labs  Lab 10/14/18 2248  BNP 396.0*    DDimer No results for input(s): DDIMER in the last 168 hours.   Radiology    Dg Abd 1 View  Result Date: 10/16/2018 CLINICAL DATA:  Evaluate feeding tube placement EXAM: ABDOMEN - 1 VIEW COMPARISON:  None. FINDINGS: The feeding tube terminates just to the right of midline, likely in the distal stomach. IMPRESSION: The feeding tube terminates either in the distal gastric body or the proximal antrum. Recommend repositioning/advancement before use. Electronically Signed   By: Dorise Bullion III M.D   On: 10/16/2018 15:48   Dg Chest Port 1 View  Result Date: 10/17/2018 CLINICAL DATA:  Respiratory failure. EXAM: PORTABLE CHEST 1 VIEW COMPARISON:  Radiograph of October 15, 2018. FINDINGS: Stable cardiomegaly. Tracheostomy tube is unchanged in position. Interval placement of feeding tube with distal tip in expected position of stomach. No pneumothorax is noted. Central pulmonary vascular congestion is noted with possible bilateral pulmonary edema or possibly pneumonia. Minimal pleural effusions are noted. Bony thorax is unremarkable. IMPRESSION: Stable cardiomegaly with central pulmonary vascular congestion. Interval placement of feeding tube with distal tip in expected position of stomach. Stable bilateral lung opacities are noted concerning for edema or possibly pneumonia. Electronically Signed   By: Marijo Conception, M.D.   On: 10/17/2018 07:26   Cardiac Studies   Left Heart Cath 03/24/2018   Severe native coronary artery disease with total occlusion of the proximal LAD, total  occlusion of the proximal RCA, and patent circumflex with patent proximal stent with eccentric 50% proximal narrowing. Distal obtuse marginal branches are severely and diffusely diseased.  Bypass graft failure with total  occlusion of SVG to RCA.  Patent LIMA to LAD.  Right coronary territory is supplied by collaterals from LAD and circumflex.  Left ventricular systolic dysfunction with EF 35 to 45%. Mid to distal inferior wall akinesis. Inferobasal and inferoapical wall severely hypokinetic. Left ventricular end-diastolic pressure elevated at 19 mmHg  Moderate to severe pulmonary hypertension  Echocardiogram 10/16/2018  1. The left ventricle has not assessed. The cavity size was normal. There is no increased left ventricular wall thickness. Left ventricular diastology could not be evaluated due to nondiagnostic images. 2. The right ventricle has not been assessed systolic function. The cavity was mildly enlarged. There is not assessed. 3. Left atrial size was mildly dilated. 4. The mitral valve is normal in structure. There is mild thickening. 5. The tricuspid valve is normal in structure. 6. The aortic valve is tricuspid There is mild thickening and mild calcification of the aortic valve. 7. The pulmonic valve was grossly normal. Pulmonic valve regurgitation is mild by color flow Doppler. 8. The inferior vena cava was dilated in size with <50% respiratory variability. 9. Extremely poor acoustic windows limit study LVEF appears severely depressed with inferior, inferoseptal akinesis; hypoknesis elsewhere This is different from echo report of July 2019. 10. RV is not seen well enough to evaluate function. 11. The interatrial septum was not assessed.  Patient Profile     AMAIRANY SCHUMPERT is a 53 y.o. female with a hx of COPD s/p tracheostomy in 2002, insulin dependent DM, CAD s/p CABG, and Pulm HTN who initially presented with acute hypoxic respiratory failure and DKA. She was subsequently intubated, aggressively hydrated, started on an insulin ggt, and put on pressors. She has significantly improved with resolution of her DKA and she is now off pressors; however, repeat echocardiogram illustrated  acute worsening of her LVEF. Given that she is slow to wean from the vent, her reduction in LVEF, and her pulmonary edema cardiology was consulted for further evaluation.   Assessment & Plan    Acute on Chronic HFrEF CAD s/p CABG  Type 2 Pulmonary HTN - Continues to have tenderness to palpation of chest wall  - Net negative 3.2L over past 24 hours. Since admission is still positive 7.2L  - Renal function, BUN, and Bicarb up slightly. Would decrease frequency of diuretics to BID - Troponin checked and found to be elevated at 6.18 now down to 4.63. Suspect this is 2/2 to her severe multivessel disease CAD + acute illness resulting in cardiac arrest. She is currently on heparin for lupus anticoagulant and history of DVT - Once medically stable, off pressors >48 hours, can begin to restart medical therapy for HF (metoprolol 50 mg BID, Lisinopril 20 mg).   For questions or updates, please contact [place] Please consult [email or web address] for contact info under Cardiology/STEMI.     Signed, Ina Homes, MD  10/18/2018, 7:38 AM    I have examined the patient and reviewed assessment and plan and discussed with patient.  Agree with above as stated.   Elevated troponin related to demand ischemia from her code.  SHe has MSK chest pain from compressions.  Troponin trending down. No ischemic w/u planned given recent cath, 2019.   Known RCA occlusion with left to right collaterals from the LIMA to LAD circulation.   Continue supportive care.  Continue diuresis. EF should improve over time.   Larae Grooms

## 2018-10-18 NOTE — Progress Notes (Signed)
Inpatient Diabetes Program Recommendations  AACE/ADA: New Consensus Statement on Inpatient Glycemic Control (2015)  Target Ranges:  Prepandial:   less than 140 mg/dL      Peak postprandial:   less than 180 mg/dL (1-2 hours)      Critically ill patients:  140 - 180 mg/dL   Lab Results  Component Value Date   GLUCAP 282 (H) 10/18/2018   HGBA1C 8.5 (H) 03/22/2018      Results for TAMU, GOLZ (MRN [id number]) as of 10/18/2018 09:42  Ref. Range 10/17/2018 07:51 10/17/2018 11:59 10/17/2018 15:27 10/17/2018 19:53 10/17/2018 23:59 10/18/2018 04:16 10/18/2018 07:39  Glucose-Capillary Latest Ref Range: 70 - 99 mg/dL 188 (H)  Novolog 3 units 196 (H)  Novolog 3 units  219 (H)  Novolog 5 units 250 (H)  Novolog 5 units 240 (H)  Novolog 5 units 268 (H)  Novolog 8 units 282 (H)  Novolog 8 units      Diabetes history : DM  Home DM meds: Tresiba 25 units daily   Current orders for inpatient glycemic control: Lantus 20 units daily                                                                          Novolog moderate (0-15 units) Q4  - just increased to Novolog resistant (0-20 units) Q4   TF @ 55 ml/hr   Blood glucose remains above target. Has received a total of 37 units of Novolog past 24 hours. Recommend adding Novolog tube feed coverage Q4 hours.     MD please consider following inpatient glycemic control recommendations:   Add 3 units Novolog tube feed coverage Q4 hours (do not give if tube feed held or stopped)    Thanks.  -- Will follow during hospitalization.--  Jonna Clark RN, MSN Diabetes Coordinator Inpatient Glycemic Control Team Team Pager: [phone] (8am-5pm)

## 2018-10-18 NOTE — Progress Notes (Signed)
ANTICOAGULATION CONSULT NOTE - Follow Up Consult  Pharmacy Consult for IV Heparin Indication: Lupus anticoagulant and history of a DVT (on Warfarin prior to admission)  Allergies  Allergen Reactions  . Penicillins Rash    Has patient had a PCN reaction causing immediate rash, facial/tongue/throat swelling, SOB or lightheadedness with hypotension: Yes Has patient had a PCN reaction causing severe rash involving mucus membranes or skin necrosis: No Has patient had a PCN reaction that required hospitalization No Has patient had a PCN reaction occurring within the last 10 years: No If all of the above answers are "NO", then may proceed with Cephalosporin use.   REACTION: rash  . Ciprofloxacin     nausea  . Ibuprofen Rash    swelling in leg     Patient Measurements: Height: 4\' 10"  (147.3 cm) Weight: 224 lb 6.9 oz (101.8 kg) IBW/kg (Calculated) : 40.9 Heparin Dosing Weight: 65.7  Vital Signs: Temp: 98.2 F (36.8 C) (02/12 1143) Temp Source: Oral (02/12 1143) BP: 98/47 (02/12 1200) Pulse Rate: 87 (02/12 1202)  Labs: Recent Labs    10/16/18 1408 10/17/18 0324 10/17/18 1258 10/17/18 1813 10/18/18 0319 10/18/18 0320 10/18/18 1025  HGB 12.9 13.1  --   --  13.2  --   --   HCT 38.0 40.5  --   --  42.8  --   --   PLT  --  177  --   --  152  --   --   LABPROT  --  20.2*  --   --  16.7*  --   --   INR  --  1.74  --   --  1.37  --   --   HEPARINUNFRC  --   --   --  0.23*  --  <0.10* 0.38  CREATININE  --  0.91  --   --  1.10*  --  0.99  TROPONINI  --   --  6.18*  --  4.63*  --   --     Estimated Creatinine Clearance: 68.5 mL/min (by C-G formula based on SCr of 0.99 mg/dL).   Medical History: Past Medical History:  Diagnosis Date  . CAD (coronary artery disease)    a. s/p prior PCI. b. CABG 2007 at [facility] in [place] 2007. c. inferior STEMI 10/2015 s/p DES to dSVG-PDA.  Marland Kitchen Cervical cancer ([place])   . Chronic diastolic CHF (congestive heart failure) ([place])   .  Chronic respiratory failure ([place])    s/p tracheostomy 2002  . Chronic RUQ pain   . COPD (chronic obstructive pulmonary disease) ([place])   . Diabetes mellitus ([place])   . DVT (deep venous thrombosis) ([place])   . Endometriosis   . History of gallstones 01/2016   seen on Ultrasound  . History of HIDA scan 11/2016   normal  . HTN (hypertension)   . Hyperlipidemia   . Lupus anticoagulant disorder ([facility])    on coumadin  . Morbid obesity ([place])   . ST elevation (STEMI) myocardial infarction involving right coronary artery ([place]) 10/29/15   stent to VG to PDA  . Toe fracture, right 03/29/2018  . Tracheostomy in place [facility]), chronic since 2002 11/03/2015   Assessment: 53 year old female on warfarin prior to admission for history of a DVT and lupus anticoagulant disorder (INR goal of 2-3). INR on admission was elevated at 3.99. INR has now trended down off therapy to 1.74. Pharmacy consulted to dose IV Heparin therapy while warfarin remains on  hold.   INR down to 1.37 today. H/H and Plt wnl. No bleeding noted.    Goal of Therapy:  Heparin level 0.3-0.7 units/ml Monitor platelets by anticoagulation protocol: Yes   Plan:  Continue IV Heparin 1100 units/hr. F/u 6 hr confirmatory level  Daily Heparin level and CBC while on therapy.  Daily PT/INR Warfarin therapy to likely resume tomorrow   Albertina Parr, PharmD., BCPS Clinical Pharmacist Clinical phone for 10/18/18 until 3:30pm: [phone] If after 3:30pm, please refer to [facility] for unit-specific pharmacist

## 2018-10-18 NOTE — Progress Notes (Signed)
ANTICOAGULATION CONSULT NOTE  Pharmacy Consult:  Heparin Indication:  Lupus anticoagulant and history of DVT  Allergies  Allergen Reactions  . Penicillins Rash    Has patient had a PCN reaction causing immediate rash, facial/tongue/throat swelling, SOB or lightheadedness with hypotension: Yes Has patient had a PCN reaction causing severe rash involving mucus membranes or skin necrosis: No Has patient had a PCN reaction that required hospitalization No Has patient had a PCN reaction occurring within the last 10 years: No If all of the above answers are "NO", then may proceed with Cephalosporin use.   REACTION: rash  . Ciprofloxacin     nausea  . Ibuprofen Rash    swelling in leg     Patient Measurements: Height: 4\' 10"  (147.3 cm) Weight: 224 lb 6.9 oz (101.8 kg) IBW/kg (Calculated) : 40.9 Heparin Dosing Weight: 66 kg  Vital Signs: Temp: 98.2 F (36.8 C) (02/12 1631) Temp Source: Oral (02/12 1631) BP: 112/85 (02/12 1800) Pulse Rate: 87 (02/12 1800)  Labs: Recent Labs    10/16/18 1408 10/17/18 0324 10/17/18 1258  10/18/18 0319 10/18/18 0320 10/18/18 1025 10/18/18 1610  HGB 12.9 13.1  --   --  13.2  --   --   --   HCT 38.0 40.5  --   --  42.8  --   --   --   PLT  --  177  --   --  152  --   --   --   LABPROT  --  20.2*  --   --  16.7*  --   --   --   INR  --  1.74  --   --  1.37  --   --   --   HEPARINUNFRC  --   --   --    < >  --  <0.10* 0.38 0.30  CREATININE  --  0.91  --   --  1.10*  --  0.99  --   TROPONINI  --   --  6.18*  --  4.63*  --   --   --    < > = values in this interval not displayed.    Estimated Creatinine Clearance: 68.5 mL/min (by C-G formula based on SCr of 0.99 mg/dL).  Assessment: 53 year old female on warfarin PTA for history of a DVT and lupus anticoagulant disorder. INR on admission was elevated at 3.99, but has now trended down to sub-therapeutic level.  Pharmacy consulted to dose IV heparin.   Heparin level is therapeutic; no  bleeding reported.  Goal of Therapy:  Heparin level 0.3-0.7 units/ml Monitor platelets by anticoagulation protocol: Yes   Plan:  Increase heparin gtt to 1200 units/hr to prevent a drop in AM F/U AM labs   Jnaya Butrick D. Mina Marble, PharmD, BCPS, [place] 10/18/2018, 7:03 PM

## 2018-10-18 NOTE — Progress Notes (Signed)
..   NAME:  Theresa [name], MRN:  [id number], DOB:  06-28-1966, LOS: 3 ADMISSION DATE:  10/14/2018, CONSULTATION DATE:  10/15/2018 REFERRING MD:  Vanita Panda MD, CHIEF COMPLAINT:  Shortness of Breath   Brief History    53 yr old female w/ PMHx sig for COPD s/p tracheostomy in 2002(6 metal [place]), IDDM, HTN, CAD (July 2019 with pulm htn as well),STEMI, tobacco use, cervical cancer and DVT  That presented on 10/14/2018 via [place]. Initial triage documentation states that pt was very short of breath despite tracheostomy appearing in place.  ABG was drawn when pt was placed on 98% trach collar along with 100% NRB ABG:7.286/46/66/22 Patient had suctioning, irrigation, flushing of her tracheostomy and a treatment which resulted in some improved respiratory status, though only with multiple breathing treatments, continued suctioning the patient have saturation in the 90% range.  She also received empiric antibiotics due to her CXR. BMET - showed elevated BG with AG--> DKA PCCM asked to admit     Significant Hospital Events   2/9 Resp distress after trach care, PEA arrest due to hypoxia, jackson trach removed and ET tube inserted through the stoma. S/p trach and central line placemebnt. On epinephrine and vasopressin. Continues on insulin drip. Awake, talking on the vent.   2/10 - coming down on pressors. On vasop and levophed 30mcg only. Down to 50% fio2. On lot of fluids. Following commands. Writing saying her left foot neuropathy is moderately bad and was initiating complaint. Home gabapentin on hold and restarted at low dose. A line not working well. Trach fine and on vent   2/11 - echo yesterday poor [place] but appears LVEF is severely depressed; new since July 2019. +9.7L  - despite 1 dose lasix yesterday. Off vasopressin. Culture negative so far. INR 1.74  Consults:    Procedures:    Significant Diagnostic Tests:  CXR FINDINGS: Tracheostomy tube terminates over the tracheal air  column at the level of the thoracic inlet. Intact sternotomy wires. Stable cardiomediastinal silhouette with mild cardiomegaly. No pneumothorax. No pleural effusion. Diffuse fluffy opacity throughout both lungs, worsened.  IMPRESSION: Stable mild cardiomegaly. Diffuse fluffy opacity throughout both lungs, favor moderate to severe pulmonary edema due to congestive heart failure.  Micro Data:  2/9-blood culture 2/9 - rvp neg 2/8 - hiv neg  Antimicrobials:  2/9- Vanco > 2/10 2/9- Azactam > 2/10 2/10 - cefepime   Interim history/subjective:   2/12 - cards has recommended continued diuresis. Dx c/w stunned myocardium. No indication for cath currently.  fLuid balance improved to +7.9L and creat beginning to rise  On IV heparin gtt for prior DVT and lupus anticoag. Off pressors.   Objective   Blood pressure 113/83, pulse 87, temperature 98.2 F (36.8 C), temperature source Oral, resp. rate 20, height 4\' 10"  (1.473 m), weight 101.8 kg, SpO2 94 %.    Vent Mode: PSV;CPAP FiO2 (%):  [40 %-50 %] 40 % Set Rate:  [18 bmp] 18 bmp Vt Set:  [320 mL] 320 mL PEEP:  [5 cmH20] 5 cmH20 Pressure Support:  [8 cmH20-10 cmH20] 8 cmH20 Plateau Pressure:  [18 cmH20-19 cmH20] 19 cmH20   Intake/Output Summary (Last 24 hours) at 10/18/2018 0951 Last data filed at 10/18/2018 0900 Gross per 24 hour  Intake 1926.69 ml  Output 3735 ml  Net -1808.31 ml   Filed Weights   10/16/18 0401 10/17/18 0100 10/18/18 0457  Weight: 101.4 kg 101.2 kg 101.8 kg    General Appearance:  Looks better. Obest +  Head:  Normocephalic, without obvious abnormality, atraumatic Eyes:  PERRL - yes, conjunctiva/corneas - yes     Ears:  Normal external ear canals, both ears Nose:  G tube - no Throat:  ETT TUBE - NO. TRACH + , OG tube - NO Neck:  Supple,  No enlargement/tenderness/nodules Lungs: Clear to auscultation bilaterally, Ventilator   Synchrony - yes. Has chest wall tenderness post CPR Heart:  S1 and S2 normal, no  murmur, CVP - no.  Pressors - no Abdomen:  Soft, no masses, no organomegaly Genitalia / Rectal:  Not done Extremities:  Extremities- intact Skin:  ntact in exposed areas . Sacral area - not examined. Psoriasis rash + Neurologic:  Sedation - none -> RASS - +1 . Moves all 4s - yes. CAM-ICU - neg . Orientation - x3+          LABS    PULMONARY Recent Labs  Lab 10/14/18 2313  10/15/18 0458 10/15/18 0527 10/15/18 0550 10/15/18 1111 10/16/18 1408  PHART 7.286*  --  7.233*  --  7.271* 7.402 7.365  PCO2ART 46.9  --  51.8*  --  45.5 36.1 49.7*  PO2ART 66.0*  --  75.0*  --  62.0* 89.0 75.0*  HCO3 22.3   < > 21.9 21.2 21.0 22.4 28.4*  TCO2 24   < > 23 22 22 24 30   O2SAT 90.0   < > 92.0 85.0 88.0 97.0 94.0   < > = values in this interval not displayed.    CBC Recent Labs  Lab 10/15/18 1126 10/16/18 1408 10/17/18 0324 10/18/18 0319  HGB 14.7 12.9 13.1 13.2  HCT 44.9 38.0 40.5 42.8  WBC 26.5*  --  13.9* 10.6*  PLT 265  --  177 152    COAGULATION Recent Labs  Lab 10/15/18 1126 10/17/18 0324 10/18/18 0319  INR 3.66 1.74 1.37    CARDIAC   Recent Labs  Lab 10/14/18 2247 10/17/18 1258 10/18/18 0319  TROPONINI <0.03 6.18* 4.63*   No results for input(s): PROBNP in the last 168 hours.   CHEMISTRY Recent Labs  Lab 10/15/18 0533  10/15/18 1126 10/15/18 1625 10/15/18 2144 10/16/18 0526 10/16/18 1136 10/16/18 1408 10/17/18 0324 10/18/18 0319  NA 146*   < > 146* 143 134* 137  --  134* 138 139  K 2.5*   < > 2.3* 3.5 3.6 5.2*  --  4.4 4.3 5.7*  CL 102  --  98 103 97* 101  --   --  103 101  CO2 19*  --  27 28 25 22   --   --  26 34*  GLUCOSE 768*  --  357* 144* 154* 216*  --   --  209* 255*  BUN 14  --  14 15 14 16   --   --  24* 27*  CREATININE 1.54*  --  1.42* 1.05* 0.94 1.04*  --   --  0.91 1.10*  CALCIUM 8.2*  --  8.3* 7.6* 7.1* 7.4*  --   --  7.7* 8.2*  MG 2.7*  --  1.8  --   --   --  1.5*  --  1.7 2.0  PHOS 2.2*  --  <1.0*  --  2.5  --  4.0  --  3.9  2.4*   < > = values in this interval not displayed.   Estimated Creatinine Clearance: 61.7 mL/min (A) (by C-G formula based on SCr of 1.1 mg/dL (H)).   LIVER Recent Labs  Lab 10/14/18  2247 10/15/18 1126 10/17/18 0324 10/18/18 0319  AST 38  --   --   --   ALT 27  --   --   --   ALKPHOS 133*  --   --   --   BILITOT 0.8  --   --   --   PROT 8.1  --   --   --   ALBUMIN 3.5  --   --   --   INR  --  3.66 1.74 1.37     INFECTIOUS Recent Labs  Lab 10/16/18 0526 10/16/18 1136 10/16/18 1428 10/17/18 0324 10/18/18 0319  LATICACIDVEN 3.2* 3.0* 2.0*  --   --   PROCALCITON  --  1.99  --  1.59 1.09     ENDOCRINE CBG (last 3)  Recent Labs    10/17/18 2359 10/18/18 0416 10/18/18 0739  GLUCAP 240* 268* 282*          IMAGING x48h  - image(s) personally visualized  -   highlighted in bold Dg Abd 1 View  Result Date: 10/16/2018 CLINICAL DATA:  Evaluate feeding tube placement EXAM: ABDOMEN - 1 VIEW COMPARISON:  None. FINDINGS: The feeding tube terminates just to the right of midline, likely in the distal stomach. IMPRESSION: The feeding tube terminates either in the distal gastric body or the proximal antrum. Recommend repositioning/advancement before use. Electronically Signed   By: Dorise Bullion III M.D   On: 10/16/2018 15:48   Dg Chest Port 1 View  Result Date: 10/18/2018 CLINICAL DATA:  Hypoxia EXAM: PORTABLE CHEST 1 VIEW COMPARISON:  October 17, 2018 FINDINGS: Tracheostomy catheter tip is 2.0 cm above the carina. Feeding tube tip is below the diaphragm. No pneumothorax. There is cardiomegaly with pulmonary venous hypertension. There is interstitial edema with small pleural effusions bilaterally. There is postoperative change in the right base. No evident adenopathy. No bone lesions. IMPRESSION: Tube positions as described without pneumothorax. There is pulmonary vascular congestion with moderate interstitial edema. No frank consolidation. Small pleural effusions  bilaterally. Electronically Signed   By: Lowella Grip III M.D.   On: 10/18/2018 08:14   Dg Chest Port 1 View  Result Date: 10/17/2018 CLINICAL DATA:  Respiratory failure. EXAM: PORTABLE CHEST 1 VIEW COMPARISON:  Radiograph of October 15, 2018. FINDINGS: Stable cardiomegaly. Tracheostomy tube is unchanged in position. Interval placement of feeding tube with distal tip in expected position of stomach. No pneumothorax is noted. Central pulmonary vascular congestion is noted with possible bilateral pulmonary edema or possibly pneumonia. Minimal pleural effusions are noted. Bony thorax is unremarkable. IMPRESSION: Stable cardiomegaly with central pulmonary vascular congestion. Interval placement of feeding tube with distal tip in expected position of stomach. Stable bilateral lung opacities are noted concerning for edema or possibly pneumonia. Electronically Signed   By: Marijo Conception, M.D.   On: 10/17/2018 07:26     Assessment & Plan:  Acute respiratory failure with hypoxia secondary to dislodged trach - treated emergently with ET tube via stoma and later converstion to PDT 10/15/2018    -10/18/2018 - >doing PSV.    PLan - WEan as tolerated and slowly liberate off vent  - ultimately need to get to permanent metal trach  Cardiac arrest, had a V. tach arrest x3 and a PEA arrest Secondary to hypoxia while trying to secure airway  10/18/2018 - off pressors   PLAN MAP Goal > 65  Hx of gr3 diast dysfn in July 2019 but echo 10/16/2018 with severe low LVEF Cards consult 2/11 -  stunned myocardium  Plan  - diurese  - resp per cards  DKA  .10/18/2018 - gap closed, bic normal as of 10/16/2018  Plan  -hyperglycemia protocol    Possible sepsis - culture negative  10/18/2018 - continue cefepime for now; aim to dc 10/19/18      PAIN -neuropathy of LLE  - restarted 1/3rd of home dose neurontin on 2/10/202 - pain better 10/17/2018 and 10/18/2018   AKI  - resolved  - no ace inhibitor (a  home med) - monitor with lasix  Hx of DV 2010 x 1 and Lupus anticoag positive per note with INR goal 2-3 per patient -cotninue IV Heparin since 10/17/2018 - aim to oral anticoag 10/19/18    Best practice:  Diet: start TF Pain/Anxiety/Delirium protocol (if indicated):dc fent gt. Changed to PRn fent/versed prn VAP protocol (if indicated): yes post intubation DVT prophylaxis:scd - awwait INR GI prophylaxis: Pepcid IV Glucose control: insulin gtt with stabilizer Mobility: continuous bedrest Q 2 turn Code Status: FULL Family Communication: husband and patient Disposition: ICU. [facility] mgmt [facility]     ATTESTATION & SIGNATURE   The patient JIANNI SHELDEN is critically ill with multiple organ systems failure and requires high complexity decision making for assessment and support, frequent evaluation and titration of therapies, application of advanced monitoring technologies and extensive interpretation of multiple databases.   Critical Care Time devoted to patient care services described in this note is  30  Minutes. This time reflects time of care of this signee Dr Brand Males. This critical care time does not reflect procedure time, or teaching time or supervisory time of PA/NP/Med student/Med Resident etc but could involve care discussion time     Dr. Brand Males, M.D., [facility].C.P Pulmonary and Critical Care Medicine Staff Physician [facility] Pager: [phone], If no answer or between  15:00h - 7:00h: call [phone]  10/18/2018 10:09 AM

## 2018-10-18 NOTE — Progress Notes (Signed)
CRITICAL VALUE ALERT  Critical Value: Troponin   Date & Time Notied: 10/18/18 5:32   Provider Notified: Warren Lacy RN   Orders Received/Actions taken: RN will notify MD

## 2018-10-18 NOTE — Progress Notes (Signed)
ANTICOAGULATION CONSULT NOTE - Follow Up Consult  Pharmacy Consult for heparin Indication: lupus anticoagulant d/o and h/o VTE  Labs: Recent Labs    10/15/18 1126  10/16/18 0526 10/16/18 1408 10/17/18 0324 10/17/18 1258 10/17/18 1813 10/18/18 0319 10/18/18 0320  HGB 14.7  --   --  12.9 13.1  --   --  13.2  --   HCT 44.9  --   --  38.0 40.5  --   --  42.8  --   PLT 265  --   --   --  177  --   --  152  --   LABPROT 35.9*  --   --   --  20.2*  --   --  16.7*  --   INR 3.66  --   --   --  1.74  --   --  1.37  --   HEPARINUNFRC  --   --   --   --   --   --  0.23*  --  <0.10*  CREATININE 1.42*   < > 1.04*  --  0.91  --   --  1.10*  --   TROPONINI  --   --   --   --   --  6.18*  --  4.63*  --    < > = values in this interval not displayed.    Assessment/Plan:  53yo female undetectable on heparin though determined that IV pump had been inadvertently programmed to 1.1 ml/hr instead of 68ml/hr. Will give small heparin bolus and have RN resume gtt at current ordered rate and confirm stable with additional level.   Wynona Neat, PharmD, BCPS  10/18/2018,5:24 AM

## 2018-10-18 NOTE — Care Management Note (Addendum)
Case Management Note  Patient Details  Name: GINNETTE GATES MRN: [id number] Date of Birth: 1966-05-16  Subjective/Objective:                    Action/Plan:  PTA from home.  Pt from home with trach - now requiring vent, IV heparin, tube feeds and IV antibiotics.  CM received referral for LTACH - CM made referrals to both [facility] - awaiting bed offers   Expected Discharge Date:                  Expected Discharge Plan:  Long Term Acute Care (LTAC)  In-House Referral:  Clinical Social Work  Discharge planning Services  CM Consult  Post Acute Care Choice:    Choice offered to:     DME Arranged:    DME Agency:     HH Arranged:    HH Agency:     Status of Service:  In process, will continue to follow  If discussed at Long Length of Stay Meetings, dates discussed:    Additional Comments: 10/18/18 Update:   Both [facility] have deemed pt appropriate for [facility]ACH - both will offer bed based on bed availability.  Currently [facility] is the only [facility] with bed availability at this time.  [facility] to provide written verbiage for pt to review with husband this evening.  CM will follow up in the am.   Maryclare Labrador, RN 10/18/2018, 10:14 AM

## 2018-10-19 ENCOUNTER — Inpatient Hospital Stay (HOSPITAL_COMMUNITY): Payer: Medicare Other

## 2018-10-19 ENCOUNTER — Other Ambulatory Visit (HOSPITAL_COMMUNITY): Payer: Medicare Other

## 2018-10-19 ENCOUNTER — Inpatient Hospital Stay
Admission: RE | Admit: 2018-10-19 | Discharge: 2018-11-22 | Disposition: A | Discharge status: Home Health | Payer: Medicare Other | Source: Other Acute Inpatient Hospital | Attending: Internal Medicine | Admitting: Internal Medicine

## 2018-10-19 DIAGNOSIS — N179 Acute kidney failure, unspecified: Secondary | ICD-10-CM | POA: Diagnosis not present

## 2018-10-19 DIAGNOSIS — Z7901 Long term (current) use of anticoagulants: Secondary | ICD-10-CM | POA: Diagnosis not present

## 2018-10-19 DIAGNOSIS — Z8541 Personal history of malignant neoplasm of cervix uteri: Secondary | ICD-10-CM | POA: Diagnosis not present

## 2018-10-19 DIAGNOSIS — Z0189 Encounter for other specified special examinations: Secondary | ICD-10-CM

## 2018-10-19 DIAGNOSIS — J9611 Chronic respiratory failure with hypoxia: Secondary | ICD-10-CM | POA: Diagnosis not present

## 2018-10-19 DIAGNOSIS — E1165 Type 2 diabetes mellitus with hyperglycemia: Secondary | ICD-10-CM | POA: Diagnosis not present

## 2018-10-19 DIAGNOSIS — K8020 Calculus of gallbladder without cholecystitis without obstruction: Secondary | ICD-10-CM | POA: Diagnosis not present

## 2018-10-19 DIAGNOSIS — Z930 Tracheostomy status: Secondary | ICD-10-CM | POA: Diagnosis not present

## 2018-10-19 DIAGNOSIS — J9620 Acute and chronic respiratory failure, unspecified whether with hypoxia or hypercapnia: Secondary | ICD-10-CM | POA: Diagnosis not present

## 2018-10-19 DIAGNOSIS — I2510 Atherosclerotic heart disease of native coronary artery without angina pectoris: Secondary | ICD-10-CM | POA: Diagnosis present

## 2018-10-19 DIAGNOSIS — R279 Unspecified lack of coordination: Secondary | ICD-10-CM | POA: Diagnosis not present

## 2018-10-19 DIAGNOSIS — J9600 Acute respiratory failure, unspecified whether with hypoxia or hypercapnia: Secondary | ICD-10-CM | POA: Diagnosis not present

## 2018-10-19 DIAGNOSIS — Z9911 Dependence on respirator [ventilator] status: Secondary | ICD-10-CM | POA: Diagnosis not present

## 2018-10-19 DIAGNOSIS — G629 Polyneuropathy, unspecified: Secondary | ICD-10-CM | POA: Diagnosis not present

## 2018-10-19 DIAGNOSIS — J449 Chronic obstructive pulmonary disease, unspecified: Secondary | ICD-10-CM | POA: Diagnosis present

## 2018-10-19 DIAGNOSIS — D3502 Benign neoplasm of left adrenal gland: Secondary | ICD-10-CM | POA: Diagnosis not present

## 2018-10-19 DIAGNOSIS — J9621 Acute and chronic respiratory failure with hypoxia: Secondary | ICD-10-CM | POA: Diagnosis not present

## 2018-10-19 DIAGNOSIS — A419 Sepsis, unspecified organism: Secondary | ICD-10-CM | POA: Diagnosis not present

## 2018-10-19 DIAGNOSIS — R1310 Dysphagia, unspecified: Secondary | ICD-10-CM | POA: Diagnosis present

## 2018-10-19 DIAGNOSIS — J9610 Chronic respiratory failure, unspecified whether with hypoxia or hypercapnia: Secondary | ICD-10-CM | POA: Diagnosis not present

## 2018-10-19 DIAGNOSIS — R188 Other ascites: Secondary | ICD-10-CM

## 2018-10-19 DIAGNOSIS — I272 Pulmonary hypertension, unspecified: Secondary | ICD-10-CM | POA: Diagnosis present

## 2018-10-19 DIAGNOSIS — I110 Hypertensive heart disease with heart failure: Secondary | ICD-10-CM | POA: Diagnosis present

## 2018-10-19 DIAGNOSIS — I469 Cardiac arrest, cause unspecified: Secondary | ICD-10-CM | POA: Diagnosis present

## 2018-10-19 DIAGNOSIS — Z6841 Body Mass Index (BMI) 40.0 and over, adult: Secondary | ICD-10-CM | POA: Diagnosis not present

## 2018-10-19 DIAGNOSIS — Z4682 Encounter for fitting and adjustment of non-vascular catheter: Secondary | ICD-10-CM | POA: Diagnosis not present

## 2018-10-19 DIAGNOSIS — R531 Weakness: Secondary | ICD-10-CM | POA: Diagnosis present

## 2018-10-19 DIAGNOSIS — Z720 Tobacco use: Secondary | ICD-10-CM | POA: Diagnosis not present

## 2018-10-19 DIAGNOSIS — Z431 Encounter for attention to gastrostomy: Secondary | ICD-10-CM | POA: Diagnosis not present

## 2018-10-19 DIAGNOSIS — J9690 Respiratory failure, unspecified, unspecified whether with hypoxia or hypercapnia: Secondary | ICD-10-CM | POA: Diagnosis not present

## 2018-10-19 DIAGNOSIS — R0902 Hypoxemia: Secondary | ICD-10-CM

## 2018-10-19 DIAGNOSIS — J811 Chronic pulmonary edema: Secondary | ICD-10-CM

## 2018-10-19 DIAGNOSIS — Z794 Long term (current) use of insulin: Secondary | ICD-10-CM | POA: Diagnosis not present

## 2018-10-19 DIAGNOSIS — I252 Old myocardial infarction: Secondary | ICD-10-CM | POA: Diagnosis not present

## 2018-10-19 DIAGNOSIS — I5021 Acute systolic (congestive) heart failure: Secondary | ICD-10-CM | POA: Diagnosis present

## 2018-10-19 DIAGNOSIS — E1010 Type 1 diabetes mellitus with ketoacidosis without coma: Secondary | ICD-10-CM | POA: Diagnosis not present

## 2018-10-19 DIAGNOSIS — I959 Hypotension, unspecified: Secondary | ICD-10-CM | POA: Diagnosis not present

## 2018-10-19 DIAGNOSIS — E46 Unspecified protein-calorie malnutrition: Secondary | ICD-10-CM | POA: Diagnosis present

## 2018-10-19 DIAGNOSIS — I5032 Chronic diastolic (congestive) heart failure: Secondary | ICD-10-CM | POA: Diagnosis present

## 2018-10-19 DIAGNOSIS — I25810 Atherosclerosis of coronary artery bypass graft(s) without angina pectoris: Secondary | ICD-10-CM | POA: Diagnosis not present

## 2018-10-19 DIAGNOSIS — J90 Pleural effusion, not elsewhere classified: Secondary | ICD-10-CM

## 2018-10-19 DIAGNOSIS — Z430 Encounter for attention to tracheostomy: Secondary | ICD-10-CM | POA: Diagnosis not present

## 2018-10-19 DIAGNOSIS — R0602 Shortness of breath: Secondary | ICD-10-CM | POA: Diagnosis not present

## 2018-10-19 DIAGNOSIS — Z86718 Personal history of other venous thrombosis and embolism: Secondary | ICD-10-CM | POA: Diagnosis not present

## 2018-10-19 DIAGNOSIS — T85598A Other mechanical complication of other gastrointestinal prosthetic devices, implants and grafts, initial encounter: Secondary | ICD-10-CM

## 2018-10-19 DIAGNOSIS — R079 Chest pain, unspecified: Secondary | ICD-10-CM | POA: Diagnosis not present

## 2018-10-19 DIAGNOSIS — N170 Acute kidney failure with tubular necrosis: Secondary | ICD-10-CM | POA: Diagnosis not present

## 2018-10-19 DIAGNOSIS — Z951 Presence of aortocoronary bypass graft: Secondary | ICD-10-CM | POA: Diagnosis not present

## 2018-10-19 DIAGNOSIS — I5030 Unspecified diastolic (congestive) heart failure: Secondary | ICD-10-CM | POA: Diagnosis not present

## 2018-10-19 DIAGNOSIS — D6862 Lupus anticoagulant syndrome: Secondary | ICD-10-CM | POA: Diagnosis present

## 2018-10-19 DIAGNOSIS — E1140 Type 2 diabetes mellitus with diabetic neuropathy, unspecified: Secondary | ICD-10-CM | POA: Diagnosis present

## 2018-10-19 DIAGNOSIS — Z8674 Personal history of sudden cardiac arrest: Secondary | ICD-10-CM | POA: Diagnosis not present

## 2018-10-19 DIAGNOSIS — J9691 Respiratory failure, unspecified with hypoxia: Secondary | ICD-10-CM | POA: Diagnosis not present

## 2018-10-19 DIAGNOSIS — Z4659 Encounter for fitting and adjustment of other gastrointestinal appliance and device: Secondary | ICD-10-CM

## 2018-10-19 DIAGNOSIS — J9811 Atelectasis: Secondary | ICD-10-CM | POA: Diagnosis not present

## 2018-10-19 DIAGNOSIS — I69991 Dysphagia following unspecified cerebrovascular disease: Secondary | ICD-10-CM | POA: Diagnosis not present

## 2018-10-19 DIAGNOSIS — Z743 Need for continuous supervision: Secondary | ICD-10-CM | POA: Diagnosis not present

## 2018-10-19 DIAGNOSIS — J9601 Acute respiratory failure with hypoxia: Secondary | ICD-10-CM | POA: Diagnosis not present

## 2018-10-19 HISTORY — DX: Tracheostomy status: Z93.0

## 2018-10-19 HISTORY — DX: Acute systolic (congestive) heart failure: I50.21

## 2018-10-19 HISTORY — DX: Acute kidney failure with tubular necrosis: N17.0

## 2018-10-19 HISTORY — DX: Cardiac arrest, cause unspecified: I46.9

## 2018-10-19 LAB — HEPARIN LEVEL (UNFRACTIONATED): Heparin Unfractionated: 0.31 [IU]/mL (ref 0.30–0.70)

## 2018-10-19 LAB — GLUCOSE, CAPILLARY
Glucose-Capillary: 88 mg/dL (ref 70–99)
Glucose-Capillary: 76 mg/dL (ref 70–99)
Glucose-Capillary: 130 mg/dL — ABNORMAL HIGH (ref 70–99)
Glucose-Capillary: 126 mg/dL — ABNORMAL HIGH (ref 70–99)
Glucose-Capillary: 112 mg/dL — ABNORMAL HIGH (ref 70–99)

## 2018-10-19 LAB — CBC
HCT: 42 % (ref 36.0–46.0)
Hemoglobin: 13 g/dL (ref 12.0–15.0)
MCH: 31.7 pg (ref 26.0–34.0)
MCHC: 31 g/dL (ref 30.0–36.0)
MCV: 102.4 fL — ABNORMAL HIGH (ref 80.0–100.0)
PLATELETS: 157 10*3/uL (ref 150–400)
RBC: 4.1 MIL/uL (ref 3.87–5.11)
RDW: 13.3 % (ref 11.5–15.5)
WBC: 12.8 10*3/uL — ABNORMAL HIGH (ref 4.0–10.5)
nRBC: 0.5 % — ABNORMAL HIGH (ref 0.0–0.2)

## 2018-10-19 LAB — PROTIME-INR
INR: 1.17
Prothrombin Time: 14.8 seconds (ref 11.4–15.2)

## 2018-10-19 LAB — BASIC METABOLIC PANEL
Anion gap: 9 (ref 5–15)
BUN: 28 mg/dL — ABNORMAL HIGH (ref 6–20)
CO2: 33 mmol/L — ABNORMAL HIGH (ref 22–32)
Calcium: 8.5 mg/dL — ABNORMAL LOW (ref 8.9–10.3)
Chloride: 96 mmol/L — ABNORMAL LOW (ref 98–111)
Creatinine, Ser: 0.82 mg/dL (ref 0.44–1.00)
GFR calc Af Amer: >60 mL/min (ref >60)
GFR calc non Af Amer: >60 mL/min (ref >60)
GLUCOSE: 100 mg/dL — AB (ref 70–99)
POTASSIUM: 4.7 mmol/L (ref 3.5–5.1)
Sodium: 138 mmol/L (ref 135–145)

## 2018-10-19 LAB — MAGNESIUM: Magnesium: 1.8 mg/dL (ref 1.7–2.4)

## 2018-10-19 LAB — PHOSPHORUS: Phosphorus: 2.9 mg/dL (ref 2.5–4.6)

## 2018-10-19 MED ORDER — MAGNESIUM SULFATE 2 GM/50ML IV SOLN
INTRAVENOUS | Status: AC
  Administered 2018-10-19: 2 g
  Filled 2018-10-19: qty 50

## 2018-10-19 MED ORDER — DOCUSATE SODIUM 50 MG/5ML PO LIQD: 100.0000 mg | Freq: Two times a day (BID) | ORAL | 0 refills | Status: DC | PRN

## 2018-10-19 MED ORDER — POLYETHYLENE GLYCOL 3350 17 G PO PACK: 17.0000 g | PACK | Freq: Every day | ORAL | 0 refills | Status: DC

## 2018-10-19 MED ORDER — SENNOSIDES 8.8 MG/5ML PO SYRP: 5.0000 mL | ORAL_SOLUTION | Freq: Every day | ORAL | 0 refills | Status: DC

## 2018-10-19 MED ORDER — CHLORHEXIDINE GLUCONATE CLOTH 2 % EX PADS: 6.0000 | MEDICATED_PAD | Freq: Every day | CUTANEOUS | Status: DC

## 2018-10-19 MED ORDER — FUROSEMIDE 10 MG/ML IJ SOLN: 80.0000 mg | Freq: Three times a day (TID) | INTRAMUSCULAR | Status: DC

## 2018-10-19 MED ORDER — INSULIN ASPART 100 UNIT/ML ~~LOC~~ SOLN
0.0000 [IU] | SUBCUTANEOUS | Status: DC
  Administered 2018-10-19 (×2): 1 [IU] via SUBCUTANEOUS

## 2018-10-19 MED ORDER — INSULIN ASPART 100 UNIT/ML ~~LOC~~ SOLN: 0.0000 [IU] | SUBCUTANEOUS | 11 refills | Status: DC

## 2018-10-19 MED ORDER — INSULIN GLARGINE 100 UNIT/ML ~~LOC~~ SOLN: 18.0000 [IU] | SUBCUTANEOUS | 11 refills | Status: DC

## 2018-10-19 MED ORDER — WARFARIN - PHARMACIST DOSING INPATIENT: Freq: Every day | Status: DC

## 2018-10-19 MED ORDER — FAMOTIDINE IN NACL 20-0.9 MG/50ML-% IV SOLN: 20.0000 mg | Freq: Two times a day (BID) | INTRAVENOUS | Status: DC

## 2018-10-19 MED ORDER — VITAL HIGH PROTEIN PO LIQD: 1000.0000 mL | ORAL | Status: DC

## 2018-10-19 MED ORDER — ALBUTEROL SULFATE (2.5 MG/3ML) 0.083% IN NEBU: 2.5000 mg | INHALATION_SOLUTION | Freq: Two times a day (BID) | RESPIRATORY_TRACT | 12 refills | Status: DC

## 2018-10-19 MED ORDER — WARFARIN SODIUM 5 MG PO TABS: 5.0000 mg | ORAL_TABLET | Freq: Once | ORAL | Status: DC

## 2018-10-19 MED ORDER — MAGNESIUM SULFATE 2 GM/50ML IV SOLN: 2.0000 g | Freq: Once | INTRAVENOUS | Status: AC

## 2018-10-19 MED ORDER — DEXTROSE-NACL 5-0.45 % IV SOLN: INTRAVENOUS | Status: DC

## 2018-10-19 MED ORDER — INFLUENZA VAC SPLIT QUAD 0.5 ML IM SUSY: 0.5000 mL | PREFILLED_SYRINGE | INTRAMUSCULAR | Status: DC | PRN

## 2018-10-19 MED ORDER — SODIUM CHLORIDE 0.9 % IV SOLN: INTRAVENOUS | Status: DC

## 2018-10-19 MED ORDER — GABAPENTIN 250 MG/5ML PO SOLN: 300.0000 mg | ORAL | 12 refills | Status: DC

## 2018-10-19 MED ORDER — WARFARIN SODIUM 5 MG PO TABS
5.0000 mg | ORAL_TABLET | Freq: Once | ORAL | Status: AC
  Administered 2018-10-19: 5 mg via ORAL
  Filled 2018-10-19: qty 1

## 2018-10-19 MED ORDER — FENTANYL CITRATE (PF) 100 MCG/2ML IJ SOLN: 50.0000 ug | INTRAMUSCULAR | 0 refills | Status: DC | PRN

## 2018-10-19 MED ORDER — FUROSEMIDE 10 MG/ML IJ SOLN: 80.0000 mg | Freq: Three times a day (TID) | INTRAMUSCULAR | 0 refills | Status: DC

## 2018-10-19 MED ORDER — INSULIN ASPART 100 UNIT/ML ~~LOC~~ SOLN: 2.0000 [IU] | SUBCUTANEOUS | 11 refills | Status: DC

## 2018-10-19 MED ORDER — INSULIN GLARGINE 100 UNIT/ML ~~LOC~~ SOLN
18.0000 [IU] | SUBCUTANEOUS | Status: DC
  Filled 2018-10-19: qty 0.18

## 2018-10-19 MED ORDER — INSULIN ASPART 100 UNIT/ML ~~LOC~~ SOLN
2.0000 [IU] | SUBCUTANEOUS | Status: DC
  Administered 2018-10-19: 2 [IU] via SUBCUTANEOUS

## 2018-10-19 MED ORDER — FUROSEMIDE 10 MG/ML IJ SOLN
80.0000 mg | Freq: Three times a day (TID) | INTRAMUSCULAR | Status: DC
  Administered 2018-10-19: 80 mg via INTRAVENOUS
  Filled 2018-10-19: qty 8

## 2018-10-19 MED ORDER — HEPARIN (PORCINE) 25000 UT/250ML-% IV SOLN: 1250.0000 [IU]/h | INTRAVENOUS | Status: DC

## 2018-10-19 NOTE — Discharge Summary (Signed)
Physician Discharge Summary         Patient ID: Theresa [name] MRN: [id number] DOB/AGE: 53-Mar-1967 53 y.o.  Admit date: 10/14/2018 Discharge date: 10/19/2018  Discharge Diagnoses:    Acute hypoxic respiratory failure Tracheostomy dislodgment Chronic ventilatory failure Cardiac arrest (ventricular tachycardia x3, PEA x1) due to hypoxia associated w/ dislodged trach  Grade 3 diastolic heart failure Systolic cardiomyopathy, felt secondary to stunned myocardium postarrest Diabetic ketoacidosis Possible sepsis Acute kidney injury History of DVT in 2010, lupus anticoagulant positive   Discharge summary   53 year old female patient with a past medical history of chronic respiratory failure in the setting of COPD, she is been tracheostomy dependent since 2002 with a middle 6 Jackson tracheostomy.  She also has a history of insulin-dependent diabetes, hypertension, pulmonary hypertension, and tobacco abuse.  She also has a history of cervical cancer and prior DVT with lupus anticoagulant positive testing.  She initially presented to the emergency room at [facility] on 2/8 with report of acute onset shortness of breath.  Initially it appeared as though her tracheostomy was in satisfactory position.  She was initially admitted with a working diagnosis of possible sepsis and diabetic ketoacidosis.   Hospital course: 2/9 Resp distress after trach care, PEA arrest due to hypoxia, jackson trach removed and ET tube inserted through the stoma. S/p trach and central line placemebnt. On epinephrine and vasopressin. Continues on insulin drip. Awake, talking on the vent. 2/10 - coming down on pressors. On vasop and levophed 54mcg only. Down to 50% fio2. On lot of fluids. Following commands. Writing saying her left foot neuropathy is moderately bad and was initiating complaint. Home gabapentin on hold and restarted at low dose. A line not working well. Trach fine and on vent 2/11 - echo yesterday  poor windown but appears LVEF is severely depressed; new since July 2019. +9.7L  - despite 1 dose lasix yesterday. Off vasopressin. Culture negative so far. INR 1.74 2/12 - cards has recommended continued diuresis. Dx c/w stunned myocardium. No indication for cath currently.  fLuid balance improved to +7.9L and creat beginning to rise  On IV heparin gtt for prior DVT and lupus anticoag. Off pressors.  2/13: Ready for transfer to long-term acute care setting for ongoing weaning efforts   Discharge Plan by Active Problems    Acute respiratory failure with hypoxia secondary to dislodged trach - treated emergently with ET tube via stoma and later converstion to PDT 10/15/2018. Now on acute on chronic resp failure PLan - Wean as tolerated and slowly liberate off vent ; ultimately need to get to permanent metal trach - increase diuresis  Cardiac arrest, had a V. tach arrest x3 and a PEA arrest Secondary to hypoxia while trying to secure airway Hx of gr3 diast dysfn in July 2019 but echo 10/16/2018 with severe low LVEF Cards consult 2/11 - stunned myocardium Plan increase diuresis (see meds) Consider adding back lisinopril and metoprolol if renal fxn remains stable over next few days; dosing metoprolol 50mg  bid and lisinopril 20mg /d  DKA (resolved) 10/19/2018 - gap closed, bic normal as of 10/16/2018 Plan  -hyperglycemia protocol  Possible sepsis - culture negative  10/18/2018 - dc'd antibiotics Plan Trend fever curve off abx  PAIN -neuropathy of LLE  - restarted 1/3rd of home dose neurontin on 2/10/202 - pain better 10/17/2018 and 10/18/2018 and 10/19/2018 Plan Cont supportive care   Recent AKI- resolved Plan Careful observation of renal fxn now that ace inhibitor (a home med) has been resumed  per cardiology   Hx of DV 2010 x 1 and Lupus anticoag positive per note with INR goal 2-3 per patient - IV Heparin since 10/17/2018.  Changed to coumadin 10/19/2018 Plan Cont heparin and coumadin  bridge   Best practice    Diet: TF Pain/Anxiety/Delirium protocol (if indicated): nil for 10/19/2018 VAP protocol (if indicated): yes post intubation DVT prophylaxis: oral couamdin start 10/19/2018 and slowly wean heparin off (hx of DVT with Lupus Anticaog +) GI prophylaxis: Pepcid IV Glucose control: was dka. Now hyperglycemia protocol Mobility: continuous bedrest Q 2 turn Code Status: FULL Family Communication: husband and patient [facility]position [facility] to [facility]  Significant Hospital tests/ studies   Procedures    Culture data/antimicrobials   2/9-blood culture 2/9 - rvp neg 2/8 - hiv neg   2/9- Vanco > 2/10 2/9- Azactam > 2/10 2/10 - cefepime > 2/13  Consults  Cardiology    Discharge Exam: Blood Pressure 118/88   Pulse 90   Temperature 98.1 F (36.7 C) (Oral)   Respiration (Abnormal) 23   Height 4\' 10"  (1.473 m)   Weight 102.1 kg   Oxygen Saturation 95%   Body Mass Index 47.04 kg/m   General Appearance:  obese Head:  Normocephalic, without obvious abnormality, atraumatic Eyes:  PERRL - yes, conjunctiva/corneas - clear     Ears:  Normal external ear canals, both ears Nose:  G tube - YES PANDA Throat:  ETT TUBE - no , OG tube - no, TRACH + Neck:  Supple,  No enlargement/tenderness/nodules Lungs: Clear to auscultation bilaterally, Ventilator   Synchrony - yes Heart:  S1 and S2 normal, no murmur, CVP - no.  Pressors - no Abdomen:  Soft, no masses, no organomegaly Genitalia / Rectal:  Not done Extremities:  Extremities- intact Skin:  ntact in exposed areas . Sacral area - not examped. PSORIASIS + Neurologic:  Sedation - none -> RASS - +1 . Moves all 4s - yes. CAM-ICU - neg . Orientation - x3+   Labs at discharge   Lab Results  Component Value Date   CREATININE 0.82 10/19/2018   BUN 28 (H) 10/19/2018   NA 138 10/19/2018   K 4.7 10/19/2018   CL 96 (L) 10/19/2018   CO2 33 (H) 10/19/2018   Lab Results  Component Value Date   WBC 12.8 (H) 10/19/2018   HGB  13.0 10/19/2018   HCT 42.0 10/19/2018   MCV 102.4 (H) 10/19/2018   PLT 157 10/19/2018   Lab Results  Component Value Date   ALT 27 10/14/2018   AST 38 10/14/2018   ALKPHOS 133 (H) 10/14/2018   BILITOT 0.8 10/14/2018   Lab Results  Component Value Date   INR 1.17 10/19/2018   INR 1.37 10/18/2018   INR 1.74 10/17/2018    Current radiological studies    Dg Chest Port 1 View  Result Date: 10/19/2018 CLINICAL DATA:  Acute hypoxemic respiratory failure. Follow-up exam. EXAM: PORTABLE CHEST 1 VIEW COMPARISON:  10/18/2018 and prior studies. FINDINGS: Vascular congestion, bilateral interstitial thickening and mild hazy opacity at the lung bases is similar to the prior exam. Probable small pleural effusions. There are stable anastomosis staples at the right lung base. Cardiac silhouette is enlarged. Tracheostomy tube and enteric feeding tube are stable. IMPRESSION: 1. No significant change when compared to the previous day's study. Cardiomegaly with vascular Jensen interstitial edema consistent with CHF. Support apparatus is stable. Electronically Signed   By: Lajean Manes M.D.   On: 10/19/2018 13:58   Dg  Chest Port 1 View  Result Date: 10/18/2018 CLINICAL DATA:  Hypoxia EXAM: PORTABLE CHEST 1 VIEW COMPARISON:  October 17, 2018 FINDINGS: Tracheostomy catheter tip is 2.0 cm above the carina. Feeding tube tip is below the diaphragm. No pneumothorax. There is cardiomegaly with pulmonary venous hypertension. There is interstitial edema with small pleural effusions bilaterally. There is postoperative change in the right base. No evident adenopathy. No bone lesions. IMPRESSION: Tube positions as described without pneumothorax. There is pulmonary vascular congestion with moderate interstitial edema. No frank consolidation. Small pleural effusions bilaterally. Electronically Signed   By: Lowella Grip III M.D.   On: 10/18/2018 08:14    Disposition:    Discharge disposition: 02-Transferred to  [facility]       Discharge Instructions    Increase activity slowly   Complete by:  As directed       Allergies as of 10/19/2018    Allergen Reactions Comment   Penicillins Rash Has patient had a PCN reaction causing immediate rash, facial/tongue/throat swelling, SOB or lightheadedness with hypotension: Yes Has patient had a PCN reaction causing severe rash involving mucus membranes or skin necrosis: No Has patient had a PCN reaction that required hospitalization No Has patient had a PCN reaction occurring within the last 10 years: No If all of the above answers are "NO", then may proceed with Cephalosporin use. REACTION: rash   Ciprofloxacin  nausea   Ibuprofen Rash swelling in leg      Medication List    Stop taking these medications   clopidogrel 75 MG tablet Commonly known as:  PLAVIX   furosemide 40 MG tablet Commonly known as:  LASIX Replaced by:  furosemide 10 MG/ML injection   gabapentin 300 MG capsule Commonly known as:  NEURONTIN Replaced by:  gabapentin 250 MG/5ML solution   isosorbide mononitrate 30 MG 24 hr tablet Commonly known as:  IMDUR   lisinopril 20 MG tablet Commonly known as:  PRINIVIL,ZESTRIL   pantoprazole 40 MG tablet Commonly known as:  PROTONIX   rosuvastatin 10 MG tablet Commonly known as:  CRESTOR   TRESIBA FLEXTOUCH 100 UNIT/ML Sopn FlexTouch Pen Generic drug:  insulin degludec     Take these medications   albuterol (2.5 MG/3ML) 0.083% nebulizer solution Commonly known as:  PROVENTIL Take 3 mLs (2.5 mg total) by nebulization 2 (two) times daily. What changed:    when to take this  reasons to take this  Another medication with the same name was removed. Continue taking this medication, and follow the directions you see here.   Chlorhexidine Gluconate Cloth 2 % Pads Apply 6 each topically daily at 6 (six) AM. Start taking on:  October 20, 2018   dextrose 5 % and 0.45% NaCl infusion KVO   docusate 50 MG/5ML  liquid Commonly known as:  COLACE Place 10 mLs (100 mg total) into feeding tube 2 (two) times daily as needed for mild constipation.   famotidine 20-0.9 MG/50ML-% Commonly known as:  PEPCID Inject 50 mLs (20 mg total) into the vein every 12 (twelve) hours.   feeding supplement (VITAL HIGH PROTEIN) Liqd liquid Place 1,000 mLs into feeding tube daily. Start taking on:  October 20, 2018   fentaNYL 100 MCG/2ML injection Commonly known as:  SUBLIMAZE Inject 1-4 mLs (50-200 mcg total) into the vein every 2 (two) hours as needed for severe pain.   furosemide 10 MG/ML injection Commonly known as:  LASIX Inject 8 mLs (80 mg total) into the vein every 8 (eight) hours.  Replaces:  furosemide 40 MG tablet   gabapentin 250 MG/5ML solution Commonly known as:  NEURONTIN Place 6 mLs (300 mg total) into feeding tube daily. Replaces:  gabapentin 300 MG capsule   heparin 25000-0.45 UT/250ML-% infusion Inject 1,250 Units/hr into the vein continuous.   insulin aspart 100 UNIT/ML injection Commonly known as:  novoLOG Inject 0-9 Units into the skin every 4 (four) hours.   insulin aspart 100 UNIT/ML injection Commonly known as:  novoLOG Inject 2 Units into the skin every 4 (four) hours.   insulin glargine 100 UNIT/ML injection Commonly known as:  LANTUS Inject 0.18 mLs (18 Units total) into the skin daily.   nitroGLYCERIN 0.4 MG SL tablet Commonly known as:  NITROSTAT Place 0.4 mg under the tongue every 5 (five) minutes as needed for chest pain.   polyethylene glycol packet Commonly known as:  MIRALAX / GLYCOLAX Take 17 g by mouth daily. Start taking on:  October 20, 2018   sennosides 8.8 MG/5ML syrup Commonly known as:  SENOKOT Place 5 mLs into feeding tube daily. Start taking on:  October 20, 2018   warfarin 5 MG tablet Commonly known as:  COUMADIN Take 1 tablet (5 mg total) by mouth one time only at 6 PM. What changed:    how much to take  when to take this  additional  instructions        Follow-up appointment   na Discharge Condition:    fair  Physician Statement:   The Patient was personally examined, the discharge assessment and plan has been personally reviewed and I agree with ACNP 's assessment and plan. 34  minutes of time have been dedicated to discharge assessment, planning and discharge instructions.   Signed: Clementeen Graham 10/19/2018, 2:09 PM

## 2018-10-19 NOTE — Progress Notes (Signed)
Late entry 10-16-2018 Fentanyl 269mls wasted in sink. Witness by Cristopher Peru RN

## 2018-10-19 NOTE — Care Management Note (Addendum)
Case Management Note  Patient Details  Name: Theresa [name] MRN: [id number] Date of Birth: 1966-07-11  Subjective/Objective:                    Action/Plan:  PTA from home.  Pt from home with trach - now requiring vent, IV heparin, tube feeds and IV antibiotics.  CM received referral for LTACH - CM made referrals to both [facility] and [facility] - awaiting bed offers   Expected Discharge Date:                  Expected Discharge Plan:  Long Term Acute Care (LTAC)  In-House Referral:  Clinical Social Work  Discharge planning Services  CM Consult  Post Acute Care Choice:    Choice offered to:     DME Arranged:    DME Agency:     HH Arranged:    HH Agency:     Status of Service:  In process, will continue to follow  If discussed at Long Length of Stay Meetings, dates discussed:    Additional Comments: 10/19/2018 [facility] has bed availability today.  CM gave pt and husband choice between [facility] and [facility] (facilities within 60 miles of home address) - [facility] was chosen.  CM informed both agencies.  CM discussed discharge readiness with attending - pt stable for discharge to [facility] today.   Attending and bedside nurse requested to complete EMTALA  10/18/18 Update:   Both [facility] and [facility] have deemed pt appropriate for LTACH - both will offer bed based on bed availability.  Currently [facility] is the only LTACH with bed availability at this time.  [facility] to provide written verbiage for pt to review with husband this evening.  CM will follow up in the am.   Maryclare Labrador, RN 10/19/2018, 9:22 AM

## 2018-10-19 NOTE — Progress Notes (Addendum)
Progress Note  Patient Name: Theresa [name] Date of Encounter: 10/19/2018  Primary Cardiologist: [name], MD   Subjective   Patient very sore this AM. Feels her breathing hasn't improved very much. She is otherwise okay. We discussed the plan to continue diuretics and hopeful this will help her breathing. She voices understanding. All questions and concerns addressed.   Inpatient Medications    Scheduled Meds: . albuterol  2.5 mg Nebulization BID  . chlorhexidine gluconate (MEDLINE KIT)  15 mL Mouth Rinse BID  . Chlorhexidine Gluconate Cloth  6 each Topical Q0600  . feeding supplement (VITAL HIGH PROTEIN)  1,000 mL Per Tube Q24H  . furosemide  40 mg Intravenous BID  . gabapentin  300 mg Per Tube Q24H  . insulin aspart  0-20 Units Subcutaneous Q4H  . insulin aspart  3 Units Subcutaneous Q4H  . insulin glargine  20 Units Subcutaneous Q24H  . mouth rinse  15 mL Mouth Rinse 10 times per day  . polyethylene glycol  17 g Oral Daily  . sennosides  5 mL Per Tube Daily  . sodium chloride flush  10-40 mL Intracatheter Q12H   Continuous Infusions: . ceFEPime (MAXIPIME) IV Stopped (10/19/18 0538)  . dextrose 5 % and 0.45% NaCl Stopped (10/15/18 2244)  . famotidine (PEPCID) IV Stopped (10/18/18 2336)  . heparin 1,200 Units/hr (10/19/18 0600)   PRN Meds: albuterol, docusate, fentaNYL (SUBLIMAZE) injection, midazolam, sodium chloride flush   Vital Signs    Vitals:   10/19/18 0500 10/19/18 0600 10/19/18 0746 10/19/18 0755  BP: (!) 127/93 (!) 113/101    Pulse: 89 92  91  Resp: (!) 23 (!) 26  (!) 25  Temp:   98.4 F (36.9 C)   TempSrc:   Oral   SpO2: 94% 94%  (!) 89%  Weight: 102.1 kg     Height:        Intake/Output Summary (Last 24 hours) at 10/19/2018 0919 Last data filed at 10/19/2018 0800 Gross per 24 hour  Intake 1879.71 ml  Output 2657.5 ml  Net -777.79 ml   Filed Weights   10/17/18 0100 10/18/18 0457 10/19/18 0500  Weight: 101.2 kg 101.8 kg 102.1 kg    Telemetry    NSR with NSVT (5 & 3 beats) - Personally Reviewed  ECG    NSR - Personally Reviewed  Physical Exam   Today's Vitals   10/19/18 0600 10/19/18 0746 10/19/18 0755 10/19/18 0800  BP: (!) 113/101     Pulse: 92  91   Resp: (!) 26  (!) 25   Temp:  98.4 F (36.9 C)    TempSrc:  Oral    SpO2: 94%  (!) 89%   Weight:      Height:      PainSc:    0-No pain   Body mass index is 47.04 kg/m.  GEN: No acute distress.   Neck: No JVD Cardiac: RRR, no murmurs, rubs, or gallops.  Respiratory: Continues to have bilateral rhonchi but is improved compared to prior. GI: Soft, nontender, non-distended  MS: mild to moderate pitting edema; No deformity. Neuro:  Nonfocal  Psych: Normal affect   Labs    Chemistry Recent Labs  Lab 10/14/18 2247  10/18/18 0319 10/18/18 1025 10/19/18 0351  NA 133*   < > 139 139 138  K 4.5   < > 5.7* 4.8 4.7  CL 96*   < > 101 97* 96*  CO2 17*   < > 34* 31 33*  GLUCOSE 650*   < > 255* 229* 100*  BUN 10   < > 27* 28* 28*  CREATININE 0.91   < > 1.10* 0.99 0.82  CALCIUM 8.8*   < > 8.2* 8.4* 8.5*  PROT 8.1  --   --   --   --   ALBUMIN 3.5  --   --   --   --   AST 38  --   --   --   --   ALT 27  --   --   --   --   ALKPHOS 133*  --   --   --   --   BILITOT 0.8  --   --   --   --   GFRNONAA >60   < > 58* >60 >60  GFRAA >60   < > >60 >60 >60  ANIONGAP 20*   < > 4* 11 9   < > = values in this interval not displayed.    Hematology Recent Labs  Lab 10/17/18 0324 10/18/18 0319 10/19/18 0351  WBC 13.9* 10.6* 12.8*  RBC 4.02 4.18 4.10  HGB 13.1 13.2 13.0  HCT 40.5 42.8 42.0  MCV 100.7* 102.4* 102.4*  MCH 32.6 31.6 31.7  MCHC 32.3 30.8 31.0  RDW 14.0 13.7 13.3  PLT 177 152 157   Cardiac Enzymes Recent Labs  Lab 10/14/18 2247 10/17/18 1258 10/18/18 0319  TROPONINI <0.03 6.18* 4.63*   No results for input(s): TROPIPOC in the last 168 hours.   BNP Recent Labs  Lab 10/14/18 2248  BNP 396.0*    DDimer No results for input(s):  DDIMER in the last 168 hours.   Radiology    Dg Chest Port 1 View  Result Date: 10/18/2018 CLINICAL DATA:  Hypoxia EXAM: PORTABLE CHEST 1 VIEW COMPARISON:  October 17, 2018 FINDINGS: Tracheostomy catheter tip is 2.0 cm above the carina. Feeding tube tip is below the diaphragm. No pneumothorax. There is cardiomegaly with pulmonary venous hypertension. There is interstitial edema with small pleural effusions bilaterally. There is postoperative change in the right base. No evident adenopathy. No bone lesions. IMPRESSION: Tube positions as described without pneumothorax. There is pulmonary vascular congestion with moderate interstitial edema. No frank consolidation. Small pleural effusions bilaterally. Electronically Signed   By: Lowella Grip III M.D.   On: 10/18/2018 08:14   Cardiac Studies   Left Heart Cath 03/24/2018   Severe native coronary artery disease with total occlusion of the proximal LAD, total occlusion of the proximal RCA, and patent circumflex with patent proximal stent with eccentric 50% proximal narrowing. Distal obtuse marginal branches are severely and diffusely diseased.  Bypass graft failure with total occlusion of SVG to RCA.  Patent LIMA to LAD.  Right coronary territory is supplied by collaterals from LAD and circumflex.  Left ventricular systolic dysfunction with EF 35 to 45%. Mid to distal inferior wall akinesis. Inferobasal and inferoapical wall severely hypokinetic. Left ventricular end-diastolic pressure elevated at 19 mmHg  Moderate to severe pulmonary hypertension  Echocardiogram 10/16/2018  1. The left ventricle has not assessed. The cavity size was normal. There is no increased left ventricular wall thickness. Left ventricular diastology could not be evaluated due to nondiagnostic images. 2. The right ventricle has not been assessed systolic function. The cavity was mildly enlarged. There is not assessed. 3. Left atrial size was mildly dilated. 4.  The mitral valve is normal in structure. There is mild thickening. 5. The tricuspid valve is normal in structure.  6. The aortic valve is tricuspid There is mild thickening and mild calcification of the aortic valve. 7. The pulmonic valve was grossly normal. Pulmonic valve regurgitation is mild by color flow Doppler. 8. The inferior vena cava was dilated in size with <50% respiratory variability. 9. Extremely poor acoustic windows limit study LVEF appears severely depressed with inferior, inferoseptal akinesis; hypoknesis elsewhere This is different from echo report of July 2019. 10. RV is not seen well enough to evaluate function. 11. The interatrial septum was not assessed.  Patient Profile     Theresa [name] is a 53 y.o. female with a hx of COPD s/p tracheostomy in 2002, insulin dependent DM, CAD s/p CABG, and Pulm HTN who initially presented with acute hypoxic respiratory failure and DKA. She was subsequently intubated, aggressively hydrated, started on an insulin ggt, and put on pressors. She has significantly improved with resolution of her DKA and she is now off pressors; however, repeat echocardiogram illustrated acute worsening of her LVEF. Given that she is slow to wean from the vent, her reduction in LVEF, and her pulmonary edema cardiology was consulted for further evaluation.   Assessment & Plan    Acute on Chronic HFrEF CAD s/p CABG  Type 2 Pulmonary HTN - Continues to have tenderness to palpation of chest wall  - Net negative 1.3L over past 24 hours. Since admission is still positive 6.1L  - Renal function, BUN, and Bicarb stable - Continue IV furosemide 40 mg BID - Can consider slowly restarting home metoprolol 50 mg BID and Lisinopril 20 mg  Will discuss the case further with Dr. Irish Lack.   For questions or updates, please contact [place] Please consult [email or web address] for contact info under Cardiology/STEMI.     Signed, Ina Homes, MD  10/19/2018, 9:19  AM    I have examined the patient and reviewed assessment and plan and discussed with patient.  Agree with above as stated.    Larae Grooms

## 2018-10-19 NOTE — Progress Notes (Addendum)
Inpatient Diabetes Program Recommendations  AACE/ADA: New Consensus Statement on Inpatient Glycemic Control (2015)  Target Ranges:  Prepandial:   less than 140 mg/dL      Peak postprandial:   less than 180 mg/dL (1-2 hours)      Critically ill patients:  140 - 180 mg/dL   Lab Results  Component Value Date   GLUCAP 130 (H) 10/19/2018   HGBA1C 8.5 (H) 03/22/2018     Results for Theresa [name], Theresa [name] (MRN [id number]) as of 10/19/2018 12:53  Ref. Range 10/18/2018 07:39 10/18/2018 11:41 10/18/2018 16:30 10/18/2018 20:17 10/18/2018 23:37 10/19/2018 04:19 10/19/2018 07:44 10/19/2018 12:10  Glucose-Capillary Latest Ref Range: 70 - 99 mg/dL 282 (H) 221 (H) 192 (H) 135 (H) 109 (H) 88 76 130 (H)    DM  Home DM meds: Tresiba 25 units q HS  Current orders for inpatient glycemic control: Novolog sensitive (0-9 units) Q4 hours (decreased from moderate correction this am)  ;   Novolog 3 units Q4 hours while tube feeding running for tf coverage;  Lantus 20 units sq Q 24 hours.   Note patient's cbg dropped to 88 mg/ dl and 76 mg/dl early this am. Spoke to nurse and there was a period that tube feeding was not running for about 2 hours (tf coverage was held during this time).   Recommend slightly decreasing Lantus and Novolog tube feeding coverage.    MD please consider following inpatient glycemic control recommendations:  1. Decrease Lantus to 18 units Q 24 hours  2. Decrease to Novolog 2 units Q 4 hours tf coverage (do not give if tube feeding held or stopped)    Thank you.  -- Will follow during hospitalization.--  Jonna Clark RN, MSN Diabetes Coordinator Inpatient Glycemic Control Team Team Pager: [phone] (8am-5pm)

## 2018-10-19 NOTE — Progress Notes (Signed)
..   NAME:  Theresa [name], MRN:  [id number], DOB:  05-29-1966, LOS: 4 ADMISSION DATE:  10/14/2018, CONSULTATION DATE:  10/15/2018 REFERRING MD:  Vanita Panda MD, CHIEF COMPLAINT:  Shortness of Breath   Brief History    53 yr old female w/ PMHx sig for COPD s/p tracheostomy in 2002(6 metal [place]), IDDM, HTN, CAD (July 2019 with pulm htn as well),STEMI, tobacco use, cervical cancer and DVT  That presented on 10/14/2018 via [place]. Initial triage documentation states that pt was very short of breath despite tracheostomy appearing in place.  ABG was drawn when pt was placed on 98% trach collar along with 100% NRB ABG:7.286/46/66/22 Patient had suctioning, irrigation, flushing of her tracheostomy and a treatment which resulted in some improved respiratory status, though only with multiple breathing treatments, continued suctioning the patient have saturation in the 90% range.  She also received empiric antibiotics due to her CXR. BMET - showed elevated BG with AG--> DKA PCCM asked to admit     Significant Hospital Events   2/9 Resp distress after trach care, PEA arrest due to hypoxia, jackson trach removed and ET tube inserted through the stoma. S/p trach and central line placemebnt. On epinephrine and vasopressin. Continues on insulin drip. Awake, talking on the vent.   2/10 - coming down on pressors. On vasop and levophed 76mcg only. Down to 50% fio2. On lot of fluids. Following commands. Writing saying her left foot neuropathy is moderately bad and was initiating complaint. Home gabapentin on hold and restarted at low dose. A line not working well. Trach fine and on vent   2/11 - echo yesterday poor [place] but appears LVEF is severely depressed; new since July 2019. +9.7L  - despite 1 dose lasix yesterday. Off vasopressin. Culture negative so far. INR 1.74   2/12 - cards has recommended continued diuresis. Dx c/w stunned myocardium. No indication for cath currently.  fLuid balance improved  to +7.9L and creat beginning to rise  On IV heparin gtt for prior DVT and lupus anticoag. Off pressors.     Significant Diagnostic Tests:  CXR FINDINGS: Tracheostomy tube terminates over the tracheal air column at the level of the thoracic inlet. Intact sternotomy wires. Stable cardiomediastinal silhouette with mild cardiomegaly. No pneumothorax. No pleural effusion. Diffuse fluffy opacity throughout both lungs, worsened.  IMPRESSION: Stable mild cardiomegaly. Diffuse fluffy opacity throughout both lungs, favor moderate to severe pulmonary edema due to congestive heart failure.  Micro Data:  2/9-blood culture 2/9 - rvp neg 2/8 - hiv neg  Results for orders placed or performed during the hospital encounter of 10/14/18  Culture, blood (routine x 2) Call MD if unable to obtain prior to antibiotics being given     Status: None (Preliminary result)   Collection Time: 10/15/18 12:12 AM  Result Value Ref Range Status   Specimen Description BLOOD RIGHT ARM  Final   Special Requests   Final    BOTTLES DRAWN AEROBIC ONLY Blood Culture adequate volume   Culture   Final    NO GROWTH 4 DAYS Performed at [facility], [place] [address], [place], [place] [address]    Report Status PENDING  Incomplete  Culture, blood (routine x 2) Call MD if unable to obtain prior to antibiotics being given     Status: None (Preliminary result)   Collection Time: 10/15/18 12:13 AM  Result Value Ref Range Status   Specimen Description BLOOD LEFT ARM  Final   Special Requests   Final  BOTTLES DRAWN AEROBIC AND ANAEROBIC Blood Culture results may not be optimal due to an excessive volume of blood received in culture bottles   Culture   Final    NO GROWTH 4 DAYS Performed at [facility] [address], [place], [place] [address]    Report Status PENDING  Incomplete  MRSA PCR Screening     Status: None   Collection Time: 10/15/18  7:19 AM  Result Value Ref Range Status   MRSA by PCR NEGATIVE  NEGATIVE Final    Comment:        The GeneXpert MRSA Assay (FDA approved for NASAL specimens only), is one component of a comprehensive MRSA colonization surveillance program. It is not intended to diagnose MRSA infection nor to guide or monitor treatment for MRSA infections. Performed at [facility], [place] [address], [place], [place] [address]   Culture, respiratory (non-expectorated)     Status: None   Collection Time: 10/15/18  3:40 PM  Result Value Ref Range Status   Specimen Description TRACHEAL ASPIRATE  Final   Special Requests NONE  Final   Gram Stain   Final    RARE WBC PRESENT,BOTH PMN AND MONONUCLEAR RARE GRAM POSITIVE COCCI    Culture   Final    RARE Consistent with normal respiratory flora. Performed at [facility], [place] [address], [place], [place] [address]    Report Status 10/17/2018 FINAL  Final  Respiratory Panel by PCR     Status: None   Collection Time: 10/15/18  4:10 PM  Result Value Ref Range Status   Adenovirus NOT DETECTED NOT DETECTED Final   Coronavirus 229E NOT DETECTED NOT DETECTED Final    Comment: (NOTE) The Coronavirus on the Respiratory Panel, DOES NOT test for the novel  Coronavirus (2019 nCoV)    Coronavirus HKU1 NOT DETECTED NOT DETECTED Final   Coronavirus NL63 NOT DETECTED NOT DETECTED Final   Coronavirus OC43 NOT DETECTED NOT DETECTED Final   Metapneumovirus NOT DETECTED NOT DETECTED Final   Rhinovirus / Enterovirus NOT DETECTED NOT DETECTED Final   Influenza A NOT DETECTED NOT DETECTED Final   Influenza B NOT DETECTED NOT DETECTED Final   Parainfluenza Virus 1 NOT DETECTED NOT DETECTED Final   Parainfluenza Virus 2 NOT DETECTED NOT DETECTED Final   Parainfluenza Virus 3 NOT DETECTED NOT DETECTED Final   Parainfluenza Virus 4 NOT DETECTED NOT DETECTED Final   Respiratory Syncytial Virus NOT DETECTED NOT DETECTED Final   Bordetella pertussis NOT DETECTED NOT DETECTED Final   Chlamydophila pneumoniae NOT DETECTED NOT  DETECTED Final   Mycoplasma pneumoniae NOT DETECTED NOT DETECTED Final    Comment: Performed at [facility], [place]. [address], [place], [place] [address]  Urine Culture     Status: None   Collection Time: 10/15/18  4:24 PM  Result Value Ref Range Status   Specimen Description URINE, CATHETERIZED  Final   Special Requests NONE  Final   Culture   Final    NO GROWTH Performed at [facility], [address], [place], [place] [address]    Report Status 10/16/2018 FINAL  Final     Antimicrobials:  2/9- Vanco > 2/10 2/9- Azactam > 2/10 2/10 - cefepime > 2/13   Interim history/subjective:    2/13 -  2/13 - fluid balance down to 6.1L Some desats and upto 50% fio2. Lot of suctioning at her request and results in new - pink tinged returns. [facility]. CXR  Yesterday worse. Continues IV heparin   Objective   Blood pressure (!) 131/95, pulse 99, temperature 98.4 F (36.9 C), temperature source Oral, resp. rate (!) 29, height 4\' 10"  (1.473 m), weight 102.1 kg, SpO2 97 %.    Vent Mode: PRVC FiO2 (%):  [40 %-50 %] 50 % Set Rate:  [18 bmp] 18 bmp Vt Set:  [320 mL] 320 mL PEEP:  [5 cmH20] 5 cmH20 Pressure Support:  [8 cmH20] 8 cmH20 Plateau Pressure:  [19 cmH20-20 cmH20] 20 cmH20   Intake/Output Summary (Last 24 hours) at 10/19/2018 1118 Last data filed at 10/19/2018 1000 Gross per 24 hour  Intake 1891.97 ml  Output 3358 ml  Net -1466.03 ml   Filed Weights   10/17/18 0100 10/18/18 0457 10/19/18 0500  Weight: 101.2 kg 101.8 kg 102.1 kg    General Appearance:  obese Head:  Normocephalic, without obvious abnormality, atraumatic Eyes:  PERRL - yes, conjunctiva/corneas - clear     Ears:  Normal external ear canals, both ears Nose:  G tube - YES PANDA Throat:  ETT TUBE - no , OG tube - no, TRACH + Neck:  Supple,  No enlargement/tenderness/nodules Lungs: Clear to auscultation bilaterally, Ventilator   Synchrony -  yes Heart:  S1 and S2 normal, no murmur, CVP - no.  Pressors - no Abdomen:  Soft, no masses, no organomegaly Genitalia / Rectal:  Not done Extremities:  Extremities- intact Skin:  ntact in exposed areas . Sacral area - not examped. PSORIASIS + Neurologic:  Sedation - none -> RASS - +1 . Moves all 4s - yes. CAM-ICU - neg . Orientation - x3+    LABS    PULMONARY Recent Labs  Lab 10/14/18 2313  10/15/18 0458 10/15/18 0527 10/15/18 0550 10/15/18 1111 10/16/18 1408  PHART 7.286*  --  7.233*  --  7.271* 7.402 7.365  PCO2ART 46.9  --  51.8*  --  45.5 36.1 49.7*  PO2ART 66.0*  --  75.0*  --  62.0* 89.0 75.0*  HCO3 22.3   < > 21.9 21.2 21.0 22.4 28.4*  TCO2 24   < > 23 22 22 24 30   O2SAT 90.0   < > 92.0 85.0 88.0 97.0 94.0   < > = values in this interval not displayed.    CBC Recent Labs  Lab 10/17/18 0324 10/18/18 0319 10/19/18 0351  HGB 13.1 13.2 13.0  HCT 40.5 42.8 42.0  WBC 13.9* 10.6* 12.8*  PLT 177 152 157    COAGULATION Recent Labs  Lab 10/15/18 1126 10/17/18 0324 10/18/18 0319 10/19/18 0351  INR 3.66 1.74 1.37 1.17    CARDIAC   Recent Labs  Lab 10/14/18 2247 10/17/18 1258 10/18/18 0319  TROPONINI <0.03 6.18* 4.63*   No results for input(s): PROBNP in the last 168 hours.   CHEMISTRY Recent Labs  Lab 10/15/18 1126  10/15/18 2144 10/16/18 0526 10/16/18 1136 10/16/18 1408 10/17/18 0324 10/18/18 0319 10/18/18 1025 10/19/18 0351  NA 146*   < > 134* 137  --  134* 138 139 139 138  K 2.3*   < > 3.6 5.2*  --  4.4 4.3 5.7* 4.8 4.7  CL 98   < > 97* 101  --   --  103 101 97* 96*  CO2 27   < > 25 22  --   --  26 34* 31 33*  GLUCOSE 357*   < > 154* 216*  --   --  209* 255* 229* 100*  BUN 14   < > 14 16  --   --  24* 27* 28* 28*  CREATININE 1.42*   < > 0.94 1.04*  --   --  0.91 1.10* 0.99 0.82  CALCIUM 8.3*   < > 7.1* 7.4*  --   --  7.7* 8.2* 8.4* 8.5*  MG 1.8  --   --   --  1.5*  --  1.7 2.0  --  1.8  PHOS <1.0*  --  2.5  --  4.0  --  3.9 2.4*  --   2.9   < > = values in this interval not displayed.   Estimated Creatinine Clearance: 82.9 mL/min (by C-G formula based on SCr of 0.82 mg/dL).   LIVER Recent Labs  Lab 10/14/18 2247 10/15/18 1126 10/17/18 0324 10/18/18 0319 10/19/18 0351  AST 38  --   --   --   --   ALT 27  --   --   --   --   ALKPHOS 133*  --   --   --   --   BILITOT 0.8  --   --   --   --   PROT 8.1  --   --   --   --   ALBUMIN 3.5  --   --   --   --   INR  --  3.66 1.74 1.37 1.17     INFECTIOUS Recent Labs  Lab 10/16/18 0526 10/16/18 1136 10/16/18 1428 10/17/18 0324 10/18/18 0319  LATICACIDVEN 3.2* 3.0* 2.0*  --   --   PROCALCITON  --  1.99  --  1.59 1.09     ENDOCRINE CBG (last 3)  Recent Labs    10/18/18 2337 10/19/18 0419 10/19/18 0744  GLUCAP 109* 88 76          IMAGING x48h  - image(s) personally visualized  -   highlighted in bold Dg Chest Port 1 View  Result Date: 10/18/2018 CLINICAL DATA:  Hypoxia EXAM: PORTABLE CHEST 1 VIEW COMPARISON:  October 17, 2018 FINDINGS: Tracheostomy catheter tip is 2.0 cm above the carina. Feeding tube tip is below the diaphragm. No pneumothorax. There is cardiomegaly with pulmonary venous hypertension. There is interstitial edema with small pleural effusions bilaterally. There is postoperative change in the right base. No evident adenopathy. No bone lesions. IMPRESSION: Tube positions as described without pneumothorax. There is pulmonary vascular congestion with moderate interstitial edema. No frank consolidation. Small pleural effusions bilaterally. Electronically Signed   By: Lowella Grip III M.D.   On: 10/18/2018 08:14     Assessment & Plan:  Acute respiratory failure with hypoxia secondary to dislodged trach - treated emergently with ET tube via stoma and later converstion to PDT 10/15/2018. Now on acute on chronic resp failure    -10/19/2018 - > worsening cxr and pink tinged secretons with IV heparin. Hypoxemia some worse   PLan - WEan  as tolerated and slowly liberate off vent ; ultimately need to get to permanent metal trach - increase diuresis  Cardiac arrest, had a V. tach arrest x3 and a PEA arrest Secondary to hypoxia while trying to secure airway  10/19/2018 - not on pressors   PLAN MAP Goal > 65  Hx of gr3 diast dysfn in July 2019 but echo 10/16/2018 with severe low LVEF Cards consult 2/11 - stunned myocardium  Plan  - diurese; increase diuresis  - resp per cards  DKA  .10/19/2018 -  gap closed, bic normal as of 10/16/2018  Plan  -hyperglycemia protocol    Possible sepsis - culture negative  10/18/2018 - dc antibiotics     PAIN -neuropathy of LLE  - restarted 1/3rd of home dose neurontin on 2/10/202 - pain better 10/17/2018 and 10/18/2018 and 10/19/2018   AKI  - resolved  - avoid ace inhibitor (a home med) -NOTED CARDS PLANNING TO START THIS - 10/20/2018 - monitor with lasix  Hx of DV 2010 x 1 and Lupus anticoag positive per note with INR goal 2-3 per patient - IV Heparin since 10/17/2018.  CHange to coumadin 10/19/2018    Best practice:  Diet: TF Pain/Anxiety/Delirium protocol (if indicated): nil for 10/19/2018 VAP protocol (if indicated): yes post intubation DVT prophylaxis: oral couamdin start 10/19/2018 and slowly wean heparin off (hx of DVT with Lupus Anticaog +) GI prophylaxis: Pepcid IV Glucose control: was dka. Now hyperglycemia protocol Mobility: continuous bedrest Q 2 turn Code Status: FULL Family Communication: husband and patient Disposition move to [facility]    ATTESTATION & SIGNATURE   The patient LURLINE CAVER is critically ill with multiple organ systems failure and requires high complexity decision making for assessment and support, frequent evaluation and titration of therapies, application of advanced monitoring technologies and extensive interpretation of multiple databases.   Discharge time  devoted to patient care services described in this note is >  30  Minutes. This  time reflects time of care of this signee Dr Brand Males. This critical care time does not reflect procedure time, or teaching time or supervisory time of PA/NP/Med student/Med Resident etc but could involve care discussion time     Dr. Brand Males, M.D., [facility].C.P Pulmonary and Critical Care Medicine Staff Physician [place] Pager: [phone], If no answer or between  15:00h - 7:00h: call [phone]  10/19/2018 11:18 AM

## 2018-10-19 NOTE — Progress Notes (Signed)
ANTICOAGULATION CONSULT NOTE - Follow Up Consult  Pharmacy Consult for IV Heparin Indication: Lupus anticoagulant and history of a DVT (on Warfarin prior to admission)  Allergies  Allergen Reactions  . Penicillins Rash    Has patient had a PCN reaction causing immediate rash, facial/tongue/throat swelling, SOB or lightheadedness with hypotension: Yes Has patient had a PCN reaction causing severe rash involving mucus membranes or skin necrosis: No Has patient had a PCN reaction that required hospitalization No Has patient had a PCN reaction occurring within the last 10 years: No If all of the above answers are "NO", then may proceed with Cephalosporin use.   REACTION: rash  . Ciprofloxacin     nausea  . Ibuprofen Rash    swelling in leg     Patient Measurements: Height: 4\' 10"  (147.3 cm) Weight: 225 lb 1.4 oz (102.1 kg) IBW/kg (Calculated) : 40.9 Heparin Dosing Weight: 65.7  Vital Signs: Temp: 98.4 F (36.9 C) (02/13 0746) Temp Source: Oral (02/13 0746) BP: 131/95 (02/13 1000) Pulse Rate: 99 (02/13 1000)  Labs: Recent Labs    10/17/18 0324 10/17/18 1258  10/18/18 0319  10/18/18 1025 10/18/18 1610 10/19/18 0351  HGB 13.1  --   --  13.2  --   --   --  13.0  HCT 40.5  --   --  42.8  --   --   --  42.0  PLT 177  --   --  152  --   --   --  157  LABPROT 20.2*  --   --  16.7*  --   --   --  14.8  INR 1.74  --   --  1.37  --   --   --  1.17  HEPARINUNFRC  --   --    < >  --    < > 0.38 0.30 0.31  CREATININE 0.91  --   --  1.10*  --  0.99  --  0.82  TROPONINI  --  6.18*  --  4.63*  --   --   --   --    < > = values in this interval not displayed.    Estimated Creatinine Clearance: 82.9 mL/min (by C-G formula based on SCr of 0.82 mg/dL).   Medical History: Past Medical History:  Diagnosis Date  . CAD (coronary artery disease)    a. s/p prior PCI. b. CABG 2007 [facility] [place] 2007. c. inferior STEMI 10/2015 s/p DES to dSVG-PDA.  Marland Kitchen Cervical cancer  ([place])   . Chronic diastolic CHF (congestive heart failure) ([place])   . Chronic respiratory failure ([place])    s/p tracheostomy 2002  . Chronic RUQ pain   . COPD (chronic obstructive pulmonary disease) ([place])   . Diabetes mellitus ([place])   . DVT (deep venous thrombosis) ([place])   . Endometriosis   . History of gallstones 01/2016   seen on Ultrasound  . History of HIDA scan 11/2016   normal  . HTN (hypertension)   . Hyperlipidemia   . Lupus anticoagulant disorder ([facility])    on coumadin  . Morbid obesity ([place])   . ST elevation (STEMI) myocardial infarction involving right coronary artery ([place]) 10/29/15   stent to VG to PDA  . Toe fracture, right 03/29/2018  . Tracheostomy in place [facility]), chronic since 2002 11/03/2015   Assessment: 53 year old female on warfarin prior to admission for history of a DVT and lupus anticoagulant disorder (INR goal  of 2-3). INR on admission was elevated at 3.99. Pharmacy consulted to dose IV   INR down to 1.17 today. Heparin level this AM remains low therapeutic at 0.31 on 1200 units/hr of heparin. No s/s of bleeding noted.    Goal of Therapy:  Heparin level 0.3-0.7 units/ml Monitor platelets by anticoagulation protocol: Yes   Plan:  Increase IV heparin to 1250 units/hr  Warfarin 5 mg x 1 dose today Daily Heparin level and CBC while on therapy.  Daily PT/INR D/C heparin when INR > 2   Albertina Parr, PharmD., BCPS Clinical Pharmacist Clinical phone for 10/18/18 until 3:30pm: [phone] If after 3:30pm, please refer to [facility] for unit-specific pharmacist

## 2018-10-19 NOTE — Progress Notes (Signed)
[facility] Progress Note Patient Name: Theresa [name] DOB: 23-Jun-1966 MRN: [id number]   Date of Service  10/19/2018  HPI/Events of Note  Asked to notify NP Hoffman for DC reconciliation meds, so he can go to LTAC/NH.  eICU Interventions  Notified NP-Hoffman     Intervention Category Minor Interventions: Communication with other healthcare providers and/or family  Elmer Sow 10/19/2018, 9:45 PM

## 2018-10-19 NOTE — Care Management Important Message (Signed)
Important Message  Patient Details  Name: Theresa [name] MRN: [id number] Date of Birth: [birth year]   Medicare Important Message Given:  Yes    Maryclare Labrador, RN 10/19/2018, 9:28 AM

## 2018-10-20 LAB — CBC WITH DIFFERENTIAL/PLATELET
Abs Immature Granulocytes: 0.09 10*3/uL — ABNORMAL HIGH (ref 0.00–0.07)
Basophils Absolute: 0.1 10*3/uL (ref 0.0–0.1)
Basophils Relative: 1 %
Eosinophils Absolute: 0.1 10*3/uL (ref 0.0–0.5)
Eosinophils Relative: 1 %
HEMATOCRIT: 40.9 % (ref 36.0–46.0)
HEMOGLOBIN: 13.4 g/dL (ref 12.0–15.0)
Immature Granulocytes: 1 %
LYMPHS ABS: 1.3 10*3/uL (ref 0.7–4.0)
LYMPHS PCT: 11 %
MCH: 32.7 pg (ref 26.0–34.0)
MCHC: 32.8 g/dL (ref 30.0–36.0)
MCV: 99.8 fL (ref 80.0–100.0)
Monocytes Absolute: 1.1 10*3/uL — ABNORMAL HIGH (ref 0.1–1.0)
Monocytes Relative: 9 %
Neutro Abs: 9.2 10*3/uL — ABNORMAL HIGH (ref 1.7–7.7)
Neutrophils Relative %: 77 %
Platelets: 170 10*3/uL (ref 150–400)
RBC: 4.1 MIL/uL (ref 3.87–5.11)
RDW: 13.4 % (ref 11.5–15.5)
WBC: 11.9 10*3/uL — ABNORMAL HIGH (ref 4.0–10.5)
nRBC: 0.3 % — ABNORMAL HIGH (ref 0.0–0.2)

## 2018-10-20 LAB — CULTURE, BLOOD (ROUTINE X 2)
CULTURE: NO GROWTH
Culture: NO GROWTH
Special Requests: ADEQUATE

## 2018-10-20 LAB — BASIC METABOLIC PANEL
Anion gap: 12 (ref 5–15)
BUN: 30 mg/dL — ABNORMAL HIGH (ref 6–20)
CALCIUM: 8.9 mg/dL (ref 8.9–10.3)
CO2: 35 mmol/L — ABNORMAL HIGH (ref 22–32)
CREATININE: 0.84 mg/dL (ref 0.44–1.00)
Chloride: 92 mmol/L — ABNORMAL LOW (ref 98–111)
GFR calc Af Amer: >60 mL/min (ref >60)
GFR calc non Af Amer: >60 mL/min (ref >60)
Glucose, Bld: 112 mg/dL — ABNORMAL HIGH (ref 70–99)
Potassium: 4.3 mmol/L (ref 3.5–5.1)
Sodium: 139 mmol/L (ref 135–145)

## 2018-10-20 LAB — PROTIME-INR
INR: 1.15
Prothrombin Time: 14.6 seconds (ref 11.4–15.2)

## 2018-10-20 LAB — HEPARIN LEVEL (UNFRACTIONATED)
Heparin Unfractionated: 0.25 IU/mL — ABNORMAL LOW (ref 0.30–0.70)
Heparin Unfractionated: 0.29 IU/mL — ABNORMAL LOW (ref 0.30–0.70)

## 2018-10-21 LAB — HEPARIN LEVEL (UNFRACTIONATED)
Heparin Unfractionated: 0.21 IU/mL — ABNORMAL LOW (ref 0.30–0.70)
Heparin Unfractionated: 0.38 IU/mL (ref 0.30–0.70)

## 2018-10-21 LAB — PROTIME-INR
INR: 1.19
Prothrombin Time: 15 seconds (ref 11.4–15.2)

## 2018-10-22 LAB — HEPARIN LEVEL (UNFRACTIONATED)
HEPARIN UNFRACTIONATED: 0.3 [IU]/mL (ref 0.30–0.70)
Heparin Unfractionated: 0.28 IU/mL — ABNORMAL LOW (ref 0.30–0.70)

## 2018-10-22 LAB — PROTIME-INR
INR: 1.15
Prothrombin Time: 14.6 seconds (ref 11.4–15.2)

## 2018-10-23 DIAGNOSIS — N170 Acute kidney failure with tubular necrosis: Secondary | ICD-10-CM

## 2018-10-23 DIAGNOSIS — J9621 Acute and chronic respiratory failure with hypoxia: Secondary | ICD-10-CM

## 2018-10-23 DIAGNOSIS — Z930 Tracheostomy status: Secondary | ICD-10-CM

## 2018-10-23 DIAGNOSIS — I469 Cardiac arrest, cause unspecified: Secondary | ICD-10-CM

## 2018-10-23 DIAGNOSIS — I5021 Acute systolic (congestive) heart failure: Secondary | ICD-10-CM

## 2018-10-23 LAB — CBC
HEMATOCRIT: 41 % (ref 36.0–46.0)
Hemoglobin: 13.4 g/dL (ref 12.0–15.0)
MCH: 31.8 pg (ref 26.0–34.0)
MCHC: 32.7 g/dL (ref 30.0–36.0)
MCV: 97.2 fL (ref 80.0–100.0)
Platelets: 200 10*3/uL (ref 150–400)
RBC: 4.22 MIL/uL (ref 3.87–5.11)
RDW: 13.7 % (ref 11.5–15.5)
WBC: 8.1 10*3/uL (ref 4.0–10.5)
nRBC: 0.2 % (ref 0.0–0.2)

## 2018-10-23 LAB — PROTIME-INR
INR: 1.25
Prothrombin Time: 15.6 s — ABNORMAL HIGH (ref 11.4–15.2)

## 2018-10-23 LAB — MAGNESIUM: Magnesium: 1.7 mg/dL (ref 1.7–2.4)

## 2018-10-23 LAB — BASIC METABOLIC PANEL
ANION GAP: 11 (ref 5–15)
BUN: 17 mg/dL (ref 6–20)
CALCIUM: 9.1 mg/dL (ref 8.9–10.3)
CO2: 33 mmol/L — AB (ref 22–32)
Chloride: 92 mmol/L — ABNORMAL LOW (ref 98–111)
Creatinine, Ser: 0.76 mg/dL (ref 0.44–1.00)
GFR calc Af Amer: >60 mL/min (ref >60)
GFR calc non Af Amer: >60 mL/min (ref >60)
Glucose, Bld: 167 mg/dL — ABNORMAL HIGH (ref 70–99)
Potassium: 3.7 mmol/L (ref 3.5–5.1)
Sodium: 136 mmol/L (ref 135–145)

## 2018-10-23 LAB — HEPARIN LEVEL (UNFRACTIONATED): Heparin Unfractionated: 0.4 IU/mL (ref 0.30–0.70)

## 2018-10-23 NOTE — Consult Note (Signed)
Pulmonary [place]  PULMONARY SERVICE  Date of Service: 10/23/2018  PULMONARY CRITICAL CARE CONSULT   Theresa [name]  MWN:[id number]  DOB: Oct 26, 1965   DOA: 10/19/2018  Referring Physician: Merton Border, MD  HPI: Theresa [name] is a 53 y.o. female seen for follow up of Acute on Chronic Respiratory Failure.  Patient has multiple medical problems including respiratory failure cardiac arrest diastolic heart failure diabetic ketoacidosis acute renal failure DVT presented to the hospital with acute decompensation of respiratory failure.  Apparently patient has been tracheostomy dependent since 2002 with a Jackson trach.  Patient presented to the hospital and got into respiratory distress after routine tracheostomy care cardiac arrest the trach was removed and had a ET tube inserted through the stoma.  At that time patient also did require pressors.  Over the course of next several days the pressors were eventually weaned as well as the oxygen.  And she did appear to show stabilization.  Echocardiogram was done that showed that she had severely depressed LV ejection fraction which was a new finding apparently.  Patient was aggressively diuresed with Lasix.  Cardiology saw the patient and felt that she had stunned myocardium.  Review of Systems:  ROS performed and is unremarkable other than noted above.  Past Medical History:  Diagnosis Date  . CAD (coronary artery disease)    a. s/p prior PCI. b. CABG 2007 at [facility] in [place] 2007. c. inferior STEMI 10/2015 s/p DES to dSVG-PDA.  Marland Kitchen Cervical cancer ([place])   . Chronic diastolic CHF (congestive heart failure) ([place])   . Chronic respiratory failure ([place])    s/p tracheostomy 2002  . Chronic RUQ pain   . COPD (chronic obstructive pulmonary disease) ([place])   . Diabetes mellitus ([place])   . DVT (deep venous thrombosis) ([place])   . Endometriosis   . History of gallstones 01/2016   seen on Ultrasound   . History of HIDA scan 11/2016   normal  . HTN (hypertension)   . Hyperlipidemia   . Lupus anticoagulant disorder (HCC)    on coumadin  . Morbid obesity ([place])   . ST elevation (STEMI) myocardial infarction involving right coronary artery ([place]) 10/29/15   stent to VG to PDA  . Toe fracture, right 03/29/2018  . Tracheostomy in place [facility]), chronic since 2002 11/03/2015    Past Surgical History:  Procedure Laterality Date  . CARDIAC CATHETERIZATION N/A 10/29/2015   Procedure: Left Heart Cath and Cors/Grafts Angiography;  Surgeon: Burnell Blanks, MD;  Location: [place] [facility];  Service: Cardiovascular;  Laterality: N/A;  . CARDIAC CATHETERIZATION  10/29/2015   Procedure: Coronary Stent Intervention;  Surgeon: Burnell Blanks, MD;  Location: [place] [facility];  Service: Cardiovascular;;  . CAROTID STENT    . CESAREAN SECTION WITH BILATERAL TUBAL LIGATION    . CORONARY ARTERY BYPASS GRAFT  2007   2V  . RIGHT/LEFT HEART CATH AND CORONARY/GRAFT ANGIOGRAPHY N/A 03/24/2018   Procedure: RIGHT/LEFT HEART CATH AND CORONARY/GRAFT ANGIOGRAPHY;  Surgeon: Belva Crome, MD;  Location: [place] [facility];  Service: Cardiovascular;  Laterality: N/A;  . TRACHEOSTOMY      Social History:    reports that she has been smoking cigarettes. She started smoking about 40 years ago. She has been smoking about 0.75 packs per day. She has never used smokeless tobacco. She reports that she does not drink alcohol or use drugs.  Family History: Non-Contributory to the present illness  Allergies  Allergen Reactions  . Penicillins Rash    Has patient had a PCN reaction causing immediate rash, facial/tongue/throat swelling, SOB or lightheadedness with hypotension: Yes Has patient had a PCN reaction causing severe rash involving mucus membranes or skin necrosis: No Has patient had a PCN reaction that required hospitalization No Has patient had a PCN reaction occurring within the last 10 years:  No If all of the above answers are "NO", then may proceed with Cephalosporin use.   REACTION: rash  . Ciprofloxacin     nausea  . Ibuprofen Rash    swelling in leg     Medications: Reviewed on Rounds  Physical Exam:  Vitals: Temperature 97.6 pulse 81 respiratory rate 28 blood pressure 107/60 saturations 93%  Ventilator Settings mode ventilation pressure support FiO2 45% per support 12 PEEP 5 tidal volume 400  . General: Comfortable at this time . Eyes: Grossly normal lids, irises & conjunctiva . ENT: grossly tongue is normal . Neck: no obvious mass . Cardiovascular: S1-S2 normal no gallop or rub . Respiratory: No rhonchi no rales are noted at this time . Abdomen: Soft nondistended . Skin: no rash seen on limited exam . Musculoskeletal: not rigid . Psychiatric:unable to assess . Neurologic: no seizure no involuntary movements         Labs on Admission:  Basic Metabolic Panel: Recent Labs  Lab 10/17/18 0324 10/18/18 0319 10/18/18 1025 10/19/18 0351 10/20/18 0056 10/23/18 0718  NA 138 139 139 138 139 136  K 4.3 5.7* 4.8 4.7 4.3 3.7  CL 103 101 97* 96* 92* 92*  CO2 26 34* 31 33* 35* 33*  GLUCOSE 209* 255* 229* 100* 112* 167*  BUN 24* 27* 28* 28* 30* 17  CREATININE 0.91 1.10* 0.99 0.82 0.84 0.76  CALCIUM 7.7* 8.2* 8.4* 8.5* 8.9 9.1  MG 1.7 2.0  --  1.8  --  1.7  PHOS 3.9 2.4*  --  2.9  --   --     No results for input(s): PHART, PCO2ART, PO2ART, HCO3, O2SAT in the last 168 hours.  Liver Function Tests: No results for input(s): AST, ALT, ALKPHOS, BILITOT, PROT, ALBUMIN in the last 168 hours. No results for input(s): LIPASE, AMYLASE in the last 168 hours. No results for input(s): AMMONIA in the last 168 hours.  CBC: Recent Labs  Lab 10/17/18 0324 10/18/18 0319 10/19/18 0351 10/20/18 0056 10/23/18 0718  WBC 13.9* 10.6* 12.8* 11.9* 8.1  NEUTROABS  --   --   --  9.2*  --   HGB 13.1 13.2 13.0 13.4 13.4  HCT 40.5 42.8 42.0 40.9 41.0  MCV 100.7* 102.4*  102.4* 99.8 97.2  PLT 177 152 157 170 200    Cardiac Enzymes: Recent Labs  Lab 10/17/18 1258 10/18/18 0319  TROPONINI 6.18* 4.63*    BNP (last 3 results) Recent Labs    03/22/18 1434 10/14/18 2248  BNP 1,049.0* 396.0*    ProBNP (last 3 results) No results for input(s): PROBNP in the last 8760 hours.   Radiological Exams on Admission: Dg Abd Portable 1v  Result Date: 10/19/2018 CLINICAL DATA:  Feeding tube placement EXAM: PORTABLE ABDOMEN - 1 VIEW COMPARISON:  10/16/2018 FINDINGS: Feeding tube tip is in the distal stomach or proximal duodenum. Gas throughout mildly prominent large bowel. No evidence of bowel obstruction. IMPRESSION: Feeding tube tip in the distal stomach or proximal duodenum. Electronically Signed   By: Rolm Baptise M.D.   On: 10/19/2018 22:51    Assessment/Plan Active Problems:  Acute on chronic respiratory failure with hypoxia (HCC)   Acute systolic heart failure (HCC)   Cardiac arrest (HCC)   Acute kidney failure with lesion of tubular necrosis ([place])   Tracheostomy status ([place])   1. Acute on chronic respiratory failure with hypoxia patient is weaning on pressure support the goal is for 12 hours we will continue to advance the wean continue secretion management pulmonary toilet.  She is a chronic tracheostomy so therefore I do not expect that she will be decannulated. 2. Cardiac arrest currently rhythm is stable status post stunned myocardium will probably need a follow-up echocardiogram at some point to reassess her myocardial dysfunction. 3. Acute systolic cardiomyopathy likely secondary to stunned myocardium as per cardiology impression will reassess with a follow-up echocardiogram. 4. Acute tubular necrosis we will continue to monitor labs creatinine has slowly normalized. 5. Chronic tracheostomy she will require to keep a chronic tracheostomy at this point.  I have personally seen and evaluated the patient, evaluated laboratory and imaging results,  formulated the assessment and plan and placed orders. The Patient requires high complexity decision making for assessment and support.  Case was discussed on Rounds with the Respiratory Therapy Staff Time Spent 47minutes  Allyne Gee, MD [facility] Pulmonary Critical Care Medicine Sleep Medicine

## 2018-10-24 ENCOUNTER — Encounter: Payer: Self-pay | Admitting: Internal Medicine

## 2018-10-24 DIAGNOSIS — I5021 Acute systolic (congestive) heart failure: Secondary | ICD-10-CM | POA: Diagnosis present

## 2018-10-24 DIAGNOSIS — N170 Acute kidney failure with tubular necrosis: Secondary | ICD-10-CM | POA: Diagnosis present

## 2018-10-24 DIAGNOSIS — I469 Cardiac arrest, cause unspecified: Secondary | ICD-10-CM | POA: Diagnosis present

## 2018-10-24 DIAGNOSIS — Z930 Tracheostomy status: Secondary | ICD-10-CM

## 2018-10-24 DIAGNOSIS — J9621 Acute and chronic respiratory failure with hypoxia: Secondary | ICD-10-CM

## 2018-10-24 LAB — APTT: aPTT: 79 seconds — ABNORMAL HIGH (ref 24–36)

## 2018-10-24 LAB — PROTIME-INR
INR: 1.37
Prothrombin Time: 16.7 seconds — ABNORMAL HIGH (ref 11.4–15.2)

## 2018-10-24 LAB — HEPARIN LEVEL (UNFRACTIONATED)
Heparin Unfractionated: 0.25 IU/mL — ABNORMAL LOW (ref 0.30–0.70)
Heparin Unfractionated: 0.22 IU/mL — ABNORMAL LOW (ref 0.30–0.70)
Heparin Unfractionated: 0.4 IU/mL (ref 0.30–0.70)

## 2018-10-24 LAB — HEMOGLOBIN A1C
Hgb A1c MFr Bld: 11.8 % — ABNORMAL HIGH (ref 4.8–5.6)
Mean Plasma Glucose: 292 mg/dL

## 2018-10-24 NOTE — Progress Notes (Signed)
Pulmonary Critical Care Medicine [place]   PULMONARY CRITICAL CARE SERVICE  PROGRESS NOTE  Date of Service: 10/24/2018  Theresa [name]  WFU:[id number]  DOB: July 09, 1966   DOA: 10/19/2018  Referring Physician: Merton Border, MD  HPI: Theresa [name] is a 53 y.o. female seen for follow up of Acute on Chronic Respiratory Failure.  Patient was able to complete 12 hours yesterday on the T collar today the goal is for 16 hours  Medications: Reviewed on Rounds  Physical Exam:  Vitals: Temperature 99.0 pulse 78 respiratory 30 blood pressure 113/59 saturations 92%  Ventilator Settings off ventilator on T collar at this time  . General: Comfortable at this time . Eyes: Grossly normal lids, irises & conjunctiva . ENT: grossly tongue is normal . Neck: no obvious mass . Cardiovascular: S1 S2 normal no gallop . Respiratory: Scattered rhonchi expansion is equal . Abdomen: soft . Skin: no rash seen on limited exam . Musculoskeletal: not rigid . Psychiatric:unable to assess . Neurologic: no seizure no involuntary movements         Lab Data:   Basic Metabolic Panel: Recent Labs  Lab 10/18/18 0319 10/18/18 1025 10/19/18 0351 10/20/18 0056 10/23/18 0718  NA 139 139 138 139 136  K 5.7* 4.8 4.7 4.3 3.7  CL 101 97* 96* 92* 92*  CO2 34* 31 33* 35* 33*  GLUCOSE 255* 229* 100* 112* 167*  BUN 27* 28* 28* 30* 17  CREATININE 1.10* 0.99 0.82 0.84 0.76  CALCIUM 8.2* 8.4* 8.5* 8.9 9.1  MG 2.0  --  1.8  --  1.7  PHOS 2.4*  --  2.9  --   --     ABG: No results for input(s): PHART, PCO2ART, PO2ART, HCO3, O2SAT in the last 168 hours.  Liver Function Tests: No results for input(s): AST, ALT, ALKPHOS, BILITOT, PROT, ALBUMIN in the last 168 hours. No results for input(s): LIPASE, AMYLASE in the last 168 hours. No results for input(s): AMMONIA in the last 168 hours.  CBC: Recent Labs  Lab 10/18/18 0319 10/19/18 0351 10/20/18 0056 10/23/18 0718  WBC 10.6* 12.8*  11.9* 8.1  NEUTROABS  --   --  9.2*  --   HGB 13.2 13.0 13.4 13.4  HCT 42.8 42.0 40.9 41.0  MCV 102.4* 102.4* 99.8 97.2  PLT 152 157 170 200    Cardiac Enzymes: Recent Labs  Lab 10/17/18 1258 10/18/18 0319  TROPONINI 6.18* 4.63*    BNP (last 3 results) Recent Labs    03/22/18 1434 10/14/18 2248  BNP 1,049.0* 396.0*    ProBNP (last 3 results) No results for input(s): PROBNP in the last 8760 hours.  Radiological Exams: No results found.  Assessment/Plan Active Problems:   Acute on chronic respiratory failure with hypoxia (HCC)   Acute systolic heart failure (HCC)   Cardiac arrest (HCC)   Acute kidney failure with lesion of tubular necrosis ([place])   Tracheostomy status ([place])   1. Acute on chronic respiratory failure with hypoxia we will continue to wean on T collar as mentioned the goal is 16 hours continue secretion management pulmonary toilet 2. Acute systolic heart failure will need a follow-up echocardiogram to reassess the systolic function 3. Cardiac arrest rhythm stable at this time 4. Acute renal failure resolved 5. Tracheostomy patient has permanent tracheostomy in place which will be the goal of discharge.   I have personally seen and evaluated the patient, evaluated laboratory and imaging results, formulated the assessment and plan and placed  orders. The Patient requires high complexity decision making for assessment and support.  Case was discussed on Rounds with the Respiratory Therapy Staff  Allyne Gee, MD [facility] Pulmonary Critical Care Medicine Sleep Medicine

## 2018-10-25 LAB — CBC
HCT: 41.9 % (ref 36.0–46.0)
Hemoglobin: 13.5 g/dL (ref 12.0–15.0)
MCH: 31.8 pg (ref 26.0–34.0)
MCHC: 32.2 g/dL (ref 30.0–36.0)
MCV: 98.8 fL (ref 80.0–100.0)
PLATELETS: 222 10*3/uL (ref 150–400)
RBC: 4.24 MIL/uL (ref 3.87–5.11)
RDW: 14.2 % (ref 11.5–15.5)
WBC: 8.6 10*3/uL (ref 4.0–10.5)
nRBC: 0 % (ref 0.0–0.2)

## 2018-10-25 LAB — HEPARIN LEVEL (UNFRACTIONATED)
HEPARIN UNFRACTIONATED: 0.34 [IU]/mL (ref 0.30–0.70)
Heparin Unfractionated: 0.27 IU/mL — ABNORMAL LOW (ref 0.30–0.70)
Heparin Unfractionated: 0.24 IU/mL — ABNORMAL LOW (ref 0.30–0.70)

## 2018-10-25 LAB — PROTIME-INR
INR: 1.58
Prothrombin Time: 18.6 seconds — ABNORMAL HIGH (ref 11.4–15.2)

## 2018-10-25 LAB — PHOSPHORUS: Phosphorus: 3.8 mg/dL (ref 2.5–4.6)

## 2018-10-25 LAB — BASIC METABOLIC PANEL
Anion gap: 10 (ref 5–15)
BUN: 20 mg/dL (ref 6–20)
CO2: 35 mmol/L — ABNORMAL HIGH (ref 22–32)
Calcium: 9.1 mg/dL (ref 8.9–10.3)
Chloride: 91 mmol/L — ABNORMAL LOW (ref 98–111)
Creatinine, Ser: 0.68 mg/dL (ref 0.44–1.00)
GFR calc Af Amer: >60 mL/min (ref >60)
GLUCOSE: 170 mg/dL — AB (ref 70–99)
Potassium: 3.8 mmol/L (ref 3.5–5.1)
Sodium: 136 mmol/L (ref 135–145)

## 2018-10-25 LAB — MAGNESIUM: Magnesium: 1.8 mg/dL (ref 1.7–2.4)

## 2018-10-25 NOTE — Progress Notes (Signed)
Pulmonary Critical Care Medicine [place]   PULMONARY CRITICAL CARE SERVICE  PROGRESS NOTE  Date of Service: 10/25/2018  Theresa [name]  XBJ:[id number]  DOB: 18-Jun-1966   DOA: 10/19/2018  Referring Physician: Merton Border, MD  HPI: Theresa [name] is a 53 y.o. female seen for follow up of Acute on Chronic Respiratory Failure.  Patient is on T collar on 40% FiO2 the goal is to try to wean for 6 hours today  Medications: Reviewed on Rounds  Physical Exam:  Vitals: Temperature 97.7 pulse 71 respiratory 28 blood pressure 106/33 saturations 96%  Ventilator Settings off the ventilator on T collar 40% FiO2  . General: Comfortable at this time . Eyes: Grossly normal lids, irises & conjunctiva . ENT: grossly tongue is normal . Neck: no obvious mass . Cardiovascular: S1 S2 normal no gallop . Respiratory: No rhonchi or rales are noted at this time . Abdomen: soft . Skin: no rash seen on limited exam . Musculoskeletal: not rigid . Psychiatric:unable to assess . Neurologic: no seizure no involuntary movements         Lab Data:   Basic Metabolic Panel: Recent Labs  Lab 10/19/18 0351 10/20/18 0056 10/23/18 0718 10/25/18 0441  NA 138 139 136 136  K 4.7 4.3 3.7 3.8  CL 96* 92* 92* 91*  CO2 33* 35* 33* 35*  GLUCOSE 100* 112* 167* 170*  BUN 28* 30* 17 20  CREATININE 0.82 0.84 0.76 0.68  CALCIUM 8.5* 8.9 9.1 9.1  MG 1.8  --  1.7 1.8  PHOS 2.9  --   --  3.8    ABG: No results for input(s): PHART, PCO2ART, PO2ART, HCO3, O2SAT in the last 168 hours.  Liver Function Tests: No results for input(s): AST, ALT, ALKPHOS, BILITOT, PROT, ALBUMIN in the last 168 hours. No results for input(s): LIPASE, AMYLASE in the last 168 hours. No results for input(s): AMMONIA in the last 168 hours.  CBC: Recent Labs  Lab 10/19/18 0351 10/20/18 0056 10/23/18 0718 10/25/18 0441  WBC 12.8* 11.9* 8.1 8.6  NEUTROABS  --  9.2*  --   --   HGB 13.0 13.4 13.4 13.5  HCT 42.0  40.9 41.0 41.9  MCV 102.4* 99.8 97.2 98.8  PLT 157 170 200 222    Cardiac Enzymes: No results for input(s): CKTOTAL, CKMB, CKMBINDEX, TROPONINI in the last 168 hours.  BNP (last 3 results) Recent Labs    03/22/18 1434 10/14/18 2248  BNP 1,049.0* 396.0*    ProBNP (last 3 results) No results for input(s): PROBNP in the last 8760 hours.  Radiological Exams: No results found.  Assessment/Plan Active Problems:   Acute on chronic respiratory failure with hypoxia (HCC)   Acute systolic heart failure (HCC)   Cardiac arrest (HCC)   Acute kidney failure with lesion of tubular necrosis ([place])   Tracheostomy status ([place])   1. Acute on chronic respiratory failure with hypoxia we will continue with the T collar wean as ordered the goal is for 6 hours 2. Acute systolic heart failure doing better continue with present management. 3. Cardiac arrest rhythm is stable we will continue to follow 4. Acute renal failure with ATN stable we will continue with supportive care 5. Tracheostomy working towards eventual liberation from the ventilator.   I have personally seen and evaluated the patient, evaluated laboratory and imaging results, formulated the assessment and plan and placed orders. The Patient requires high complexity decision making for assessment and support.  Case was discussed  on Rounds with the Respiratory Therapy Staff  Allyne Gee, MD [facility] Pulmonary Critical Care Medicine Sleep Medicine

## 2018-10-26 ENCOUNTER — Other Ambulatory Visit (HOSPITAL_COMMUNITY): Payer: Medicare Other

## 2018-10-26 DIAGNOSIS — Z4682 Encounter for fitting and adjustment of non-vascular catheter: Secondary | ICD-10-CM | POA: Diagnosis not present

## 2018-10-26 LAB — HEPARIN LEVEL (UNFRACTIONATED)
Heparin Unfractionated: 0.44 [IU]/mL (ref 0.30–0.70)
Heparin Unfractionated: 0.39 [IU]/mL (ref 0.30–0.70)
Heparin Unfractionated: 0.26 IU/mL — ABNORMAL LOW (ref 0.30–0.70)
Heparin Unfractionated: 0.21 IU/mL — ABNORMAL LOW (ref 0.30–0.70)

## 2018-10-26 LAB — PROTIME-INR
INR: 1.56
Prothrombin Time: 18.5 s — ABNORMAL HIGH (ref 11.4–15.2)

## 2018-10-26 NOTE — Progress Notes (Signed)
Pulmonary Critical Care Medicine [place]   PULMONARY CRITICAL CARE SERVICE  PROGRESS NOTE  Date of Service: 10/26/2018  Theresa [name]  OAC:[id number]  DOB: Jul 21, 1966   DOA: 10/19/2018  Referring Physician: Merton Border, MD  HPI: Theresa [name] is a 53 y.o. female seen for follow up of Acute on Chronic Respiratory Failure.  Patient currently is on T collar was on 40% FiO2 she was complaining about having some pain in her chest she had gotten CPR and explained to her that this could be residual pain from that.  The last chest x-ray that was done did not show any evidence of rib fractures etc.  She was reassured  Medications: Reviewed on Rounds  Physical Exam:  Vitals: Temperature 97.6 pulse 74 respiratory rate 30 blood pressure 127/70 saturations 100%  Ventilator Settings on T collar FiO2 is 40%  . General: Comfortable at this time . Eyes: Grossly normal lids, irises & conjunctiva . ENT: grossly tongue is normal . Neck: no obvious mass . Cardiovascular: S1 S2 normal no gallop . Respiratory: Coarse breath sounds with a few rhonchi . Abdomen: soft . Skin: no rash seen on limited exam . Musculoskeletal: not rigid . Psychiatric:unable to assess . Neurologic: no seizure no involuntary movements         Lab Data:   Basic Metabolic Panel: Recent Labs  Lab 10/20/18 0056 10/23/18 0718 10/25/18 0441  NA 139 136 136  K 4.3 3.7 3.8  CL 92* 92* 91*  CO2 35* 33* 35*  GLUCOSE 112* 167* 170*  BUN 30* 17 20  CREATININE 0.84 0.76 0.68  CALCIUM 8.9 9.1 9.1  MG  --  1.7 1.8  PHOS  --   --  3.8    ABG: No results for input(s): PHART, PCO2ART, PO2ART, HCO3, O2SAT in the last 168 hours.  Liver Function Tests: No results for input(s): AST, ALT, ALKPHOS, BILITOT, PROT, ALBUMIN in the last 168 hours. No results for input(s): LIPASE, AMYLASE in the last 168 hours. No results for input(s): AMMONIA in the last 168 hours.  CBC: Recent Labs  Lab  10/20/18 0056 10/23/18 0718 10/25/18 0441  WBC 11.9* 8.1 8.6  NEUTROABS 9.2*  --   --   HGB 13.4 13.4 13.5  HCT 40.9 41.0 41.9  MCV 99.8 97.2 98.8  PLT 170 200 222    Cardiac Enzymes: No results for input(s): CKTOTAL, CKMB, CKMBINDEX, TROPONINI in the last 168 hours.  BNP (last 3 results) Recent Labs    03/22/18 1434 10/14/18 2248  BNP 1,049.0* 396.0*    ProBNP (last 3 results) No results for input(s): PROBNP in the last 8760 hours.  Radiological Exams: No results found.  Assessment/Plan Active Problems:   Acute on chronic respiratory failure with hypoxia (HCC)   Acute systolic heart failure (HCC)   Cardiac arrest (HCC)   Acute kidney failure with lesion of tubular necrosis ([place])   Tracheostomy status ([place])   1. Acute on chronic respiratory failure with hypoxia we will continue with T collar trials titrate oxygen continue pulmonary toilet 2. Acute systolic heart failure compensated we will continue present therapy 3. Cardiac arrest rhythm is stable 4. Tracheostomy working towards decannulation 5. Acute tubular necrosis improved we will continue to monitor   I have personally seen and evaluated the patient, evaluated laboratory and imaging results, formulated the assessment and plan and placed orders. The Patient requires high complexity decision making for assessment and support.  Case was discussed on Rounds with  the Respiratory Therapy Staff  Allyne Gee, MD [facility] Pulmonary Critical Care Medicine Sleep Medicine

## 2018-10-27 LAB — BASIC METABOLIC PANEL
Anion gap: 9 (ref 5–15)
BUN: 21 mg/dL — ABNORMAL HIGH (ref 6–20)
CALCIUM: 9.3 mg/dL (ref 8.9–10.3)
CO2: 35 mmol/L — ABNORMAL HIGH (ref 22–32)
Chloride: 90 mmol/L — ABNORMAL LOW (ref 98–111)
Creatinine, Ser: 0.74 mg/dL (ref 0.44–1.00)
GFR calc Af Amer: >60 mL/min (ref >60)
Glucose, Bld: 205 mg/dL — ABNORMAL HIGH (ref 70–99)
Potassium: 4.2 mmol/L (ref 3.5–5.1)
SODIUM: 134 mmol/L — AB (ref 135–145)

## 2018-10-27 LAB — CBC
HCT: 45.3 % (ref 36.0–46.0)
Hemoglobin: 14.7 g/dL (ref 12.0–15.0)
MCH: 31.4 pg (ref 26.0–34.0)
MCHC: 32.5 g/dL (ref 30.0–36.0)
MCV: 96.8 fL (ref 80.0–100.0)
Platelets: 251 10*3/uL (ref 150–400)
RBC: 4.68 MIL/uL (ref 3.87–5.11)
RDW: 13.9 % (ref 11.5–15.5)
WBC: 11.7 10*3/uL — ABNORMAL HIGH (ref 4.0–10.5)
nRBC: 0 % (ref 0.0–0.2)

## 2018-10-27 LAB — HEPARIN LEVEL (UNFRACTIONATED): Heparin Unfractionated: 0.74 IU/mL — ABNORMAL HIGH (ref 0.30–0.70)

## 2018-10-27 LAB — PHOSPHORUS: Phosphorus: 4.2 mg/dL (ref 2.5–4.6)

## 2018-10-27 LAB — PROTIME-INR
INR: 2.05
Prothrombin Time: 22.9 seconds — ABNORMAL HIGH (ref 11.4–15.2)

## 2018-10-27 LAB — MAGNESIUM: MAGNESIUM: 1.9 mg/dL (ref 1.7–2.4)

## 2018-10-27 NOTE — Progress Notes (Signed)
Pulmonary Critical Care Medicine [place]   PULMONARY CRITICAL CARE SERVICE  PROGRESS NOTE  Date of Service: 10/27/2018  JAMYRAH SAUR  IWP:[id number]  DOB: 1966/02/22   DOA: 10/19/2018  Referring Physician: Merton Border, MD  HPI: SKYLER DUSING is a 53 y.o. female seen for follow up of Acute on Chronic Respiratory Failure.  Patient is on T collar right now is been on 40% FiO2 the goal is for 8 hours today  Medications: Reviewed on Rounds  Physical Exam:  Vitals: Temperature 97.2 pulse 73 respiratory 27 blood pressure 115/68 saturations 93%  Ventilator Settings off the ventilator on T collar FiO2 40%  . General: Comfortable at this time . Eyes: Grossly normal lids, irises & conjunctiva . ENT: grossly tongue is normal . Neck: no obvious mass . Cardiovascular: S1 S2 normal no gallop . Respiratory: No rhonchi or rales are noted at this time . Abdomen: soft . Skin: no rash seen on limited exam . Musculoskeletal: not rigid . Psychiatric:unable to assess . Neurologic: no seizure no involuntary movements         Lab Data:   Basic Metabolic Panel: Recent Labs  Lab 10/23/18 0718 10/25/18 0441 10/27/18 0810  NA 136 136 134*  K 3.7 3.8 4.2  CL 92* 91* 90*  CO2 33* 35* 35*  GLUCOSE 167* 170* 205*  BUN 17 20 21*  CREATININE 0.76 0.68 0.74  CALCIUM 9.1 9.1 9.3  MG 1.7 1.8 1.9  PHOS  --  3.8 4.2    ABG: No results for input(s): PHART, PCO2ART, PO2ART, HCO3, O2SAT in the last 168 hours.  Liver Function Tests: No results for input(s): AST, ALT, ALKPHOS, BILITOT, PROT, ALBUMIN in the last 168 hours. No results for input(s): LIPASE, AMYLASE in the last 168 hours. No results for input(s): AMMONIA in the last 168 hours.  CBC: Recent Labs  Lab 10/23/18 0718 10/25/18 0441 10/27/18 0810  WBC 8.1 8.6 11.7*  HGB 13.4 13.5 14.7  HCT 41.0 41.9 45.3  MCV 97.2 98.8 96.8  PLT 200 222 251    Cardiac Enzymes: No results for input(s): CKTOTAL, CKMB,  CKMBINDEX, TROPONINI in the last 168 hours.  BNP (last 3 results) Recent Labs    03/22/18 1434 10/14/18 2248  BNP 1,049.0* 396.0*    ProBNP (last 3 results) No results for input(s): PROBNP in the last 8760 hours.  Radiological Exams: Dg Abd Portable 1v  Result Date: 10/26/2018 CLINICAL DATA:  Nasogastric tube placement EXAM: PORTABLE ABDOMEN - 1 VIEW COMPARISON:  October 19, 2018 FINDINGS: Nasogastric tube tip and side port are in the body of the stomach. There is no bowel dilatation or air-fluid level to suggest bowel obstruction. No free air. There is postoperative change in the right lung base. IMPRESSION: Nasogastric tube tip and side port in body of stomach. No evident bowel obstruction or free air. Electronically Signed   By: Lowella Grip III M.D.   On: 10/26/2018 12:17    Assessment/Plan Active Problems:   Acute on chronic respiratory failure with hypoxia (HCC)   Acute systolic heart failure (HCC)   Cardiac arrest (HCC)   Acute kidney failure with lesion of tubular necrosis ([place])   Tracheostomy status ([place])   1. Acute on chronic respiratory failure with hypoxia we will continue with T collar trials continue secretion management pulmonary toilet. 2. Acute systolic heart failure at baseline we will continue present therapy 3. Cardiac arrest rhythm is stable 4. Acute renal failure with ATN we will  continue supportive care 5. Tracheostomy remains in place   I have personally seen and evaluated the patient, evaluated laboratory and imaging results, formulated the assessment and plan and placed orders. The Patient requires high complexity decision making for assessment and support.  Case was discussed on Rounds with the Respiratory Therapy Staff  Allyne Gee, MD [facility] Pulmonary Critical Care Medicine Sleep Medicine

## 2018-10-28 LAB — PROTIME-INR
INR: 2.07
Prothrombin Time: 23.1 seconds — ABNORMAL HIGH (ref 11.4–15.2)

## 2018-10-28 NOTE — Progress Notes (Addendum)
Pulmonary Critical Care Medicine [place]   PULMONARY CRITICAL CARE SERVICE  PROGRESS NOTE  Date of Service: 10/28/2018  Theresa [name]  [id number]  DOB: 01-21-1966   DOA: 10/19/2018  Referring Physician: Merton Border, MD  HPI: Theresa [name] is a 53 y.o. female seen for follow up of Acute on Chronic Respiratory Failure.  Patient remains on trach collar at 40 to 50% FiO2.  She was able to tolerate 8 hours yesterday and today has a goal of 12 hours.  When she is resting she is on the ventilator AC VC FiO2 40% rate of 18 tidal volume 320 PEEP 5  Medications: Reviewed on Rounds  Physical Exam:  Vitals: Pulse 75 respirations 12 BP 112/54 O2 sat 96% temp 96.5  Ventilator Settings patient is on trach collar at this time will rest on the vent overnight.  Ventilator mode AC VC 40% FiO2 rate of 18 tidal volume 320 PEEP of 5  . General: Comfortable at this time . Eyes: Grossly normal lids, irises & conjunctiva . ENT: grossly tongue is normal . Neck: no obvious mass . Cardiovascular: S1 S2 normal no gallop . Respiratory: No rales or rhonchi noted . Abdomen: soft . Skin: no rash seen on limited exam . Musculoskeletal: not rigid . Psychiatric:unable to assess . Neurologic: no seizure no involuntary movements         Lab Data:   Basic Metabolic Panel: Recent Labs  Lab 10/23/18 0718 10/25/18 0441 10/27/18 0810  NA 136 136 134*  K 3.7 3.8 4.2  CL 92* 91* 90*  CO2 33* 35* 35*  GLUCOSE 167* 170* 205*  BUN 17 20 21*  CREATININE 0.76 0.68 0.74  CALCIUM 9.1 9.1 9.3  MG 1.7 1.8 1.9  PHOS  --  3.8 4.2    ABG: No results for input(s): PHART, PCO2ART, PO2ART, HCO3, O2SAT in the last 168 hours.  Liver Function Tests: No results for input(s): AST, ALT, ALKPHOS, BILITOT, PROT, ALBUMIN in the last 168 hours. No results for input(s): LIPASE, AMYLASE in the last 168 hours. No results for input(s): AMMONIA in the last 168 hours.  CBC: Recent Labs  Lab  10/23/18 0718 10/25/18 0441 10/27/18 0810  WBC 8.1 8.6 11.7*  HGB 13.4 13.5 14.7  HCT 41.0 41.9 45.3  MCV 97.2 98.8 96.8  PLT 200 222 251    Cardiac Enzymes: No results for input(s): CKTOTAL, CKMB, CKMBINDEX, TROPONINI in the last 168 hours.  BNP (last 3 results) Recent Labs    03/22/18 1434 10/14/18 2248  BNP 1,049.0* 396.0*    ProBNP (last 3 results) No results for input(s): PROBNP in the last 8760 hours.  Radiological Exams: No results found.  Assessment/Plan Active Problems:   Acute on chronic respiratory failure with hypoxia (HCC)   Acute systolic heart failure (HCC)   Cardiac arrest (HCC)   Acute kidney failure with lesion of tubular necrosis ([place])   Tracheostomy status ([place])   1. Acute on chronic respiratory failure with hypoxia continue with T collar trials and secretion management and pulmonary toilet. 2. Acute systolic heart failure at baseline continue present therapy 3. Cardiac arrest rhythm stable 4. Acute renal failure with ATN we will continue supportive care 5. Tracheostomy remains in place   I have personally seen and evaluated the patient, evaluated laboratory and imaging results, formulated the assessment and plan and placed orders. The Patient requires high complexity decision making for assessment and support.  Case was discussed on Rounds with the Respiratory  Therapy Staff  Allyne Gee, MD [facility] Pulmonary Critical Care Medicine Sleep Medicine

## 2018-10-29 LAB — PROTIME-INR
INR: 2.32
PROTHROMBIN TIME: 25.2 s — AB (ref 11.4–15.2)

## 2018-10-29 NOTE — Progress Notes (Addendum)
Pulmonary Critical Care Medicine [place]   PULMONARY CRITICAL CARE SERVICE  PROGRESS NOTE  Date of Service: 10/29/2018  Theresa [name]  [id number]  DOB: 10/25/1965   DOA: 10/19/2018  Referring Physician: Merton Border, MD  HPI: Theresa [name] is a 53 y.o. female seen for follow up of Acute on Chronic Respiratory Failure.  Patient was able to tolerate trach collar for 12 hours yesterday.  The goal today is 16 hours.  Patient is currently on 45% trach collar.  Medications: Reviewed on Rounds  Physical Exam:  Vitals: Pulse 75 respirations 20 BP 104/50 O2 sat 95% temp 97.6  Ventilator Settings patient is on T collar at this time however rest on the vent with the mode of AC VC FiO2 40% rate of 18 tidal volume 320 PEEP of 5  . General: Comfortable at this time . Eyes: Grossly normal lids, irises & conjunctiva . ENT: grossly tongue is normal . Neck: no obvious mass . Cardiovascular: S1 S2 normal no gallop . Respiratory: No rales or rhonchi noted . Abdomen: soft . Skin: no rash seen on limited exam . Musculoskeletal: not rigid . Psychiatric:unable to assess . Neurologic: no seizure no involuntary movements         Lab Data:   Basic Metabolic Panel: Recent Labs  Lab 10/23/18 0718 10/25/18 0441 10/27/18 0810  NA 136 136 134*  K 3.7 3.8 4.2  CL 92* 91* 90*  CO2 33* 35* 35*  GLUCOSE 167* 170* 205*  BUN 17 20 21*  CREATININE 0.76 0.68 0.74  CALCIUM 9.1 9.1 9.3  MG 1.7 1.8 1.9  PHOS  --  3.8 4.2    ABG: No results for input(s): PHART, PCO2ART, PO2ART, HCO3, O2SAT in the last 168 hours.  Liver Function Tests: No results for input(s): AST, ALT, ALKPHOS, BILITOT, PROT, ALBUMIN in the last 168 hours. No results for input(s): LIPASE, AMYLASE in the last 168 hours. No results for input(s): AMMONIA in the last 168 hours.  CBC: Recent Labs  Lab 10/23/18 0718 10/25/18 0441 10/27/18 0810  WBC 8.1 8.6 11.7*  HGB 13.4 13.5 14.7  HCT 41.0 41.9  45.3  MCV 97.2 98.8 96.8  PLT 200 222 251    Cardiac Enzymes: No results for input(s): CKTOTAL, CKMB, CKMBINDEX, TROPONINI in the last 168 hours.  BNP (last 3 results) Recent Labs    03/22/18 1434 10/14/18 2248  BNP 1,049.0* 396.0*    ProBNP (last 3 results) No results for input(s): PROBNP in the last 8760 hours.  Radiological Exams: No results found.  Assessment/Plan Active Problems:   Acute on chronic respiratory failure with hypoxia (HCC)   Acute systolic heart failure (HCC)   Cardiac arrest (HCC)   Acute kidney failure with lesion of tubular necrosis ([place])   Tracheostomy status ([place])   1. Acute on chronic respiratory failure with hypoxia continue with T collar trials and secretion management as well as pulmonary toilet. 2. Acute systolic heart failure at baseline continue present therapy 3. Cardiac arrest rhythm stable 4. Acute renal failure with ATN continue supportive care 5. Tracheostomy drains in place   I have personally seen and evaluated the patient, evaluated laboratory and imaging results, formulated the assessment and plan and placed orders. The Patient requires high complexity decision making for assessment and support.  Case was discussed on Rounds with the Respiratory Therapy Staff  Allyne Gee, MD [facility] Pulmonary Critical Care Medicine Sleep Medicine

## 2018-10-30 LAB — CBC
HCT: 47 % — ABNORMAL HIGH (ref 36.0–46.0)
Hemoglobin: 15.5 g/dL — ABNORMAL HIGH (ref 12.0–15.0)
MCH: 32 pg (ref 26.0–34.0)
MCHC: 33 g/dL (ref 30.0–36.0)
MCV: 97.1 fL (ref 80.0–100.0)
Platelets: 312 10*3/uL (ref 150–400)
RBC: 4.84 MIL/uL (ref 3.87–5.11)
RDW: 13.8 % (ref 11.5–15.5)
WBC: 10.7 10*3/uL — ABNORMAL HIGH (ref 4.0–10.5)
nRBC: 0 % (ref 0.0–0.2)

## 2018-10-30 LAB — BASIC METABOLIC PANEL
Anion gap: 14 (ref 5–15)
BUN: 47 mg/dL — ABNORMAL HIGH (ref 6–20)
CO2: 30 mmol/L (ref 22–32)
Calcium: 9.5 mg/dL (ref 8.9–10.3)
Chloride: 90 mmol/L — ABNORMAL LOW (ref 98–111)
Creatinine, Ser: 1.11 mg/dL — ABNORMAL HIGH (ref 0.44–1.00)
GFR calc Af Amer: >60 mL/min (ref >60)
GFR calc non Af Amer: 57 mL/min — ABNORMAL LOW (ref >60)
GLUCOSE: 204 mg/dL — AB (ref 70–99)
Potassium: 4.4 mmol/L (ref 3.5–5.1)
Sodium: 134 mmol/L — ABNORMAL LOW (ref 135–145)

## 2018-10-30 LAB — PHOSPHORUS: Phosphorus: 4.4 mg/dL (ref 2.5–4.6)

## 2018-10-30 LAB — PROTIME-INR
INR: 2.46
Prothrombin Time: 26.3 seconds — ABNORMAL HIGH (ref 11.4–15.2)

## 2018-10-30 LAB — MAGNESIUM: Magnesium: 2.2 mg/dL (ref 1.7–2.4)

## 2018-10-30 NOTE — Progress Notes (Signed)
Pulmonary Critical Care Medicine [place]   PULMONARY CRITICAL CARE SERVICE  PROGRESS NOTE  Date of Service: 10/30/2018  Theresa [name]  [id number]  DOB: 12-03-1965   DOA: 10/19/2018  Referring Physician: Merton Border, MD  HPI: Theresa [name] is a 53 y.o. female seen for follow up of Acute on Chronic Respiratory Failure.  Patient is on T collar now has been on 50% FiO2 the goal for the T collar is 20 hours today patient is also using PMV  Medications: Reviewed on Rounds  Physical Exam:  Vitals: Temperature 96.8 pulse 78 respiratory 26 blood pressure 130/69 saturations 93%  Ventilator Settings on T collar FiO2 50% with PMV in place  . General: Comfortable at this time . Eyes: Grossly normal lids, irises & conjunctiva . ENT: grossly tongue is normal . Neck: no obvious mass . Cardiovascular: S1 S2 normal no gallop . Respiratory: No rhonchi or rales are noted at this time . Abdomen: soft . Skin: no rash seen on limited exam . Musculoskeletal: not rigid . Psychiatric:unable to assess . Neurologic: no seizure no involuntary movements         Lab Data:   Basic Metabolic Panel: Recent Labs  Lab 10/25/18 0441 10/27/18 0810 10/30/18 0537  NA 136 134* 134*  K 3.8 4.2 4.4  CL 91* 90* 90*  CO2 35* 35* 30  GLUCOSE 170* 205* 204*  BUN 20 21* 47*  CREATININE 0.68 0.74 1.11*  CALCIUM 9.1 9.3 9.5  MG 1.8 1.9 2.2  PHOS 3.8 4.2 4.4    ABG: No results for input(s): PHART, PCO2ART, PO2ART, HCO3, O2SAT in the last 168 hours.  Liver Function Tests: No results for input(s): AST, ALT, ALKPHOS, BILITOT, PROT, ALBUMIN in the last 168 hours. No results for input(s): LIPASE, AMYLASE in the last 168 hours. No results for input(s): AMMONIA in the last 168 hours.  CBC: Recent Labs  Lab 10/25/18 0441 10/27/18 0810 10/30/18 0537  WBC 8.6 11.7* 10.7*  HGB 13.5 14.7 15.5*  HCT 41.9 45.3 47.0*  MCV 98.8 96.8 97.1  PLT 222 251 312    Cardiac  Enzymes: No results for input(s): CKTOTAL, CKMB, CKMBINDEX, TROPONINI in the last 168 hours.  BNP (last 3 results) Recent Labs    03/22/18 1434 10/14/18 2248  BNP 1,049.0* 396.0*    ProBNP (last 3 results) No results for input(s): PROBNP in the last 8760 hours.  Radiological Exams: No results found.  Assessment/Plan Active Problems:   Acute on chronic respiratory failure with hypoxia (HCC)   Acute systolic heart failure (HCC)   Cardiac arrest (HCC)   Acute kidney failure with lesion of tubular necrosis ([place])   Tracheostomy status ([place])   1. Acute on chronic respiratory failure with hypoxia continue with T collar trials try to wean FiO2 down. 2. Acute systolic heart failure compensated at this time 3. Cardiac arrest rhythm is stable 4. Acute renal failure improved labs 5. Tracheostomy remains in place we will continue with supportive care   I have personally seen and evaluated the patient, evaluated laboratory and imaging results, formulated the assessment and plan and placed orders. The Patient requires high complexity decision making for assessment and support.  Case was discussed on Rounds with the Respiratory Therapy Staff  Allyne Gee, MD [facility] Pulmonary Critical Care Medicine Sleep Medicine

## 2018-10-31 ENCOUNTER — Other Ambulatory Visit (HOSPITAL_COMMUNITY): Payer: Medicare Other

## 2018-10-31 DIAGNOSIS — A419 Sepsis, unspecified organism: Secondary | ICD-10-CM | POA: Diagnosis not present

## 2018-10-31 DIAGNOSIS — J9811 Atelectasis: Secondary | ICD-10-CM | POA: Diagnosis not present

## 2018-10-31 LAB — PROTIME-INR
INR: 2.7 — ABNORMAL HIGH (ref 0.8–1.2)
Prothrombin Time: 28.6 seconds — ABNORMAL HIGH (ref 11.4–15.2)

## 2018-10-31 NOTE — Progress Notes (Signed)
Pulmonary Critical Care Medicine [place]   PULMONARY CRITICAL CARE SERVICE  PROGRESS NOTE  Date of Service: 10/31/2018  Theresa [name]  [id number]  DOB: 11-Nov-1965   DOA: 10/19/2018  Referring Physician: Merton Border, MD  HPI: Theresa [name] is a 53 y.o. female seen for follow up of Acute on Chronic Respiratory Failure.  Patient is on T collar oxygen requirements are gone up to 50% apparently the patient was given some liquids by family members  Medications: Reviewed on Rounds  Physical Exam:  Vitals: Temperature 98.1 pulse 80 respiratory rate 18 blood pressure 122/66 saturations 93%  Ventilator Settings off the ventilator on T collar for a goal of 24 hours  . General: Comfortable at this time . Eyes: Grossly normal lids, irises & conjunctiva . ENT: grossly tongue is normal . Neck: no obvious mass . Cardiovascular: S1 S2 normal no gallop . Respiratory: No rhonchi no rales noted at this time. . Abdomen: soft . Skin: no rash seen on limited exam . Musculoskeletal: not rigid . Psychiatric:unable to assess . Neurologic: no seizure no involuntary movements         Lab Data:   Basic Metabolic Panel: Recent Labs  Lab 10/25/18 0441 10/27/18 0810 10/30/18 0537  NA 136 134* 134*  K 3.8 4.2 4.4  CL 91* 90* 90*  CO2 35* 35* 30  GLUCOSE 170* 205* 204*  BUN 20 21* 47*  CREATININE 0.68 0.74 1.11*  CALCIUM 9.1 9.3 9.5  MG 1.8 1.9 2.2  PHOS 3.8 4.2 4.4    ABG: No results for input(s): PHART, PCO2ART, PO2ART, HCO3, O2SAT in the last 168 hours.  Liver Function Tests: No results for input(s): AST, ALT, ALKPHOS, BILITOT, PROT, ALBUMIN in the last 168 hours. No results for input(s): LIPASE, AMYLASE in the last 168 hours. No results for input(s): AMMONIA in the last 168 hours.  CBC: Recent Labs  Lab 10/25/18 0441 10/27/18 0810 10/30/18 0537  WBC 8.6 11.7* 10.7*  HGB 13.5 14.7 15.5*  HCT 41.9 45.3 47.0*  MCV 98.8 96.8 97.1  PLT 222 251 312     Cardiac Enzymes: No results for input(s): CKTOTAL, CKMB, CKMBINDEX, TROPONINI in the last 168 hours.  BNP (last 3 results) Recent Labs    03/22/18 1434 10/14/18 2248  BNP 1,049.0* 396.0*    ProBNP (last 3 results) No results for input(s): PROBNP in the last 8760 hours.  Radiological Exams: No results found.  Assessment/Plan Active Problems:   Acute on chronic respiratory failure with hypoxia (HCC)   Acute systolic heart failure (HCC)   Cardiac arrest (HCC)   Acute kidney failure with lesion of tubular necrosis ([place])   Tracheostomy status ([place])   1. Acute on chronic respiratory failure with hypoxia patient has had increased oxygen requirements.  Concern for aspiration will get a follow-up chest x-ray. 2. Acute systolic heart failure at baseline continue with supportive care 3. Cardiac arrest rhythm is stable 4. Acute renal failure follow labs 5. Tracheostomy working towards capping   I have personally seen and evaluated the patient, evaluated laboratory and imaging results, formulated the assessment and plan and placed orders. The Patient requires high complexity decision making for assessment and support.  Case was discussed on Rounds with the Respiratory Therapy Staff  Allyne Gee, MD [facility] Pulmonary Critical Care Medicine Sleep Medicine

## 2018-11-01 LAB — BASIC METABOLIC PANEL
Anion gap: 13 (ref 5–15)
BUN: 61 mg/dL — ABNORMAL HIGH (ref 6–20)
CO2: 31 mmol/L (ref 22–32)
Calcium: 9.3 mg/dL (ref 8.9–10.3)
Chloride: 90 mmol/L — ABNORMAL LOW (ref 98–111)
Creatinine, Ser: 1.09 mg/dL — ABNORMAL HIGH (ref 0.44–1.00)
GFR calc Af Amer: >60 mL/min (ref >60)
GFR, EST NON AFRICAN AMERICAN: 58 mL/min — AB (ref >60)
GLUCOSE: 185 mg/dL — AB (ref 70–99)
Potassium: 4.5 mmol/L (ref 3.5–5.1)
Sodium: 134 mmol/L — ABNORMAL LOW (ref 135–145)

## 2018-11-01 LAB — PROTIME-INR
INR: 2.6 — ABNORMAL HIGH (ref 0.8–1.2)
Prothrombin Time: 27.1 seconds — ABNORMAL HIGH (ref 11.4–15.2)

## 2018-11-01 LAB — MAGNESIUM: Magnesium: 2.2 mg/dL (ref 1.7–2.4)

## 2018-11-01 NOTE — Progress Notes (Addendum)
Pulmonary Critical Care Medicine [place]   PULMONARY CRITICAL CARE SERVICE  PROGRESS NOTE  Date of Service: 11/01/2018  [name]  [id number]  DOB: [birth year]   DOA: 10/19/2018  Referring Physician: Merton Border, MD  HPI: Theresa [name] is a 53 y.o. female seen for follow up of Acute on Chronic Respiratory Failure.  Patient is now made it 24 hours on trach collar with an FiO2 of 40%.  She is not able to tolerate a PMV at this time and she is reporting a sore throat.  RT reports some red secretions being suctioned from airway.  Medications: Reviewed on Rounds  Physical Exam:  Vitals: Pulse 77 respirations 24 BP 108/59 O2 sat 91% temp 98.3  Ventilator Settings patient not on ventilator has been on T collar for over 24 hours.  . General: Comfortable at this time . Eyes: Grossly normal lids, irises & conjunctiva . ENT: grossly tongue is normal . Neck: no obvious mass . Cardiovascular: S1 S2 normal no gallop . Respiratory: No rales or rhonchi noted . Abdomen: soft . Skin: no rash seen on limited exam . Musculoskeletal: not rigid . Psychiatric:unable to assess . Neurologic: no seizure no involuntary movements         Lab Data:   Basic Metabolic Panel: Recent Labs  Lab 10/27/18 0810 10/30/18 0537 11/01/18 0542  NA 134* 134* 134*  K 4.2 4.4 4.5  CL 90* 90* 90*  CO2 35* 30 31  GLUCOSE 205* 204* 185*  BUN 21* 47* 61*  CREATININE 0.74 1.11* 1.09*  CALCIUM 9.3 9.5 9.3  MG 1.9 2.2 2.2  PHOS 4.2 4.4  --     ABG: No results for input(s): PHART, PCO2ART, PO2ART, HCO3, O2SAT in the last 168 hours.  Liver Function Tests: No results for input(s): AST, ALT, ALKPHOS, BILITOT, PROT, ALBUMIN in the last 168 hours. No results for input(s): LIPASE, AMYLASE in the last 168 hours. No results for input(s): AMMONIA in the last 168 hours.  CBC: Recent Labs  Lab 10/27/18 0810 10/30/18 0537  WBC 11.7* 10.7*  HGB 14.7 15.5*  HCT 45.3 47.0*  MCV  96.8 97.1  PLT 251 312    Cardiac Enzymes: No results for input(s): CKTOTAL, CKMB, CKMBINDEX, TROPONINI in the last 168 hours.  BNP (last 3 results) Recent Labs    03/22/18 1434 10/14/18 2248  BNP 1,049.0* 396.0*    ProBNP (last 3 results) No results for input(s): PROBNP in the last 8760 hours.  Radiological Exams: Dg Chest Port 1 View  Result Date: 10/31/2018 CLINICAL DATA:  Hypoxia. Sepsis. Rash. Possible fever. EXAM: PORTABLE CHEST 1 VIEW COMPARISON:  10/19/2018. FINDINGS: Grossly stable enlarged cardiac silhouette, post CABG changes and coronary artery stents. Nasogastric tube extending into the stomach. Tracheostomy tube in satisfactory position. Right basilar surgical staples are unchanged. Mild linear density at both lung bases. Decreased prominence of the interstitial markings. Probable small bilateral pleural effusions with improvement. Unremarkable bones. IMPRESSION: 1. Improving changes of congestive heart failure. 2. Mild bibasilar atelectasis. Electronically Signed   By: Claudie Revering M.D.   On: 10/31/2018 16:31    Assessment/Plan Active Problems:   Acute on chronic respiratory failure with hypoxia (HCC)   Acute systolic heart failure (HCC)   Cardiac arrest (HCC)   Acute kidney failure with lesion of tubular necrosis ([place])   Tracheostomy status ([place])   1. Acute on chronic respiratory failure with hypoxia patient has had increased oxygen requirements in the last 24 hours however  has returned down to 40% FiO2. 2. Acute systolic heart failure at baseline continue supportive care 3. Cardiac arrest rhythm is stable 4. Acute renal failure follow labs 5. Tracheostomy working towards capping   I have personally seen and evaluated the patient, evaluated laboratory and imaging results, formulated the assessment and plan and placed orders. The Patient requires high complexity decision making for assessment and support.  Case was discussed on Rounds with the Respiratory  Therapy Staff  Allyne Gee, MD [facility] Pulmonary Critical Care Medicine Sleep Medicine

## 2018-11-02 LAB — PROTIME-INR
INR: 2.1 — ABNORMAL HIGH (ref 0.8–1.2)
Prothrombin Time: 23.6 seconds — ABNORMAL HIGH (ref 11.4–15.2)

## 2018-11-02 NOTE — Progress Notes (Addendum)
Pulmonary Critical Care Medicine [place]   PULMONARY CRITICAL CARE SERVICE  PROGRESS NOTE  Date of Service: 11/02/2018  Theresa [name]  [id number]  DOB: [birth year]   DOA: 10/19/2018  Referring Physician: Merton Border, MD  HPI: Theresa [name] is a 53 y.o. female seen for follow up of Acute on Chronic Respiratory Failure.  Patient is now made a 48 hours on trach collar as of this morning.  She is going for 72 hours at this time.  She is still not able to tolerate PMV.  Medications: Reviewed on Rounds  Physical Exam:  Vitals: Pulse 80 respirations 26 BP 109/55 O2 sat 98% temp 98.0  Ventilator Settings patient not currently on ventilator  . General: Comfortable at this time . Eyes: Grossly normal lids, irises & conjunctiva . ENT: grossly tongue is normal . Neck: no obvious mass . Cardiovascular: S1 S2 normal no gallop . Respiratory: No rales or rhonchi noted . Abdomen: soft . Skin: no rash seen on limited exam . Musculoskeletal: not rigid . Psychiatric:unable to assess . Neurologic: no seizure no involuntary movements         Lab Data:   Basic Metabolic Panel: Recent Labs  Lab 10/27/18 0810 10/30/18 0537 11/01/18 0542  NA 134* 134* 134*  K 4.2 4.4 4.5  CL 90* 90* 90*  CO2 35* 30 31  GLUCOSE 205* 204* 185*  BUN 21* 47* 61*  CREATININE 0.74 1.11* 1.09*  CALCIUM 9.3 9.5 9.3  MG 1.9 2.2 2.2  PHOS 4.2 4.4  --     ABG: No results for input(s): PHART, PCO2ART, PO2ART, HCO3, O2SAT in the last 168 hours.  Liver Function Tests: No results for input(s): AST, ALT, ALKPHOS, BILITOT, PROT, ALBUMIN in the last 168 hours. No results for input(s): LIPASE, AMYLASE in the last 168 hours. No results for input(s): AMMONIA in the last 168 hours.  CBC: Recent Labs  Lab 10/27/18 0810 10/30/18 0537  WBC 11.7* 10.7*  HGB 14.7 15.5*  HCT 45.3 47.0*  MCV 96.8 97.1  PLT 251 312    Cardiac Enzymes: No results for input(s): CKTOTAL, CKMB,  CKMBINDEX, TROPONINI in the last 168 hours.  BNP (last 3 results) Recent Labs    03/22/18 1434 10/14/18 2248  BNP 1,049.0* 396.0*    ProBNP (last 3 results) No results for input(s): PROBNP in the last 8760 hours.  Radiological Exams: No results found.  Assessment/Plan Active Problems:   Acute on chronic respiratory failure with hypoxia (HCC)   Acute systolic heart failure (HCC)   Cardiac arrest (HCC)   Acute kidney failure with lesion of tubular necrosis ([place])   Tracheostomy status ([place])   1. Acute on chronic respiratory failure with hypoxia patient remains on FiO2 40% at this time.  If she makes it till tomorrow morning and will be 72 hours without the vent. 2. Acute systolic heart failure at baseline continue supportive care 3. Cardiac arrest rhythm stable 4. Acute renal failure follow labs 5. Tracheostomy working towards capping   I have personally seen and evaluated the patient, evaluated laboratory and imaging results, formulated the assessment and plan and placed orders. The Patient requires high complexity decision making for assessment and support.  Case was discussed on Rounds with the Respiratory Therapy Staff  Allyne Gee, MD [facility] Pulmonary Critical Care Medicine Sleep Medicine

## 2018-11-03 LAB — PROTIME-INR
INR: 1.9 — ABNORMAL HIGH (ref 0.8–1.2)
Prothrombin Time: 21.8 seconds — ABNORMAL HIGH (ref 11.4–15.2)

## 2018-11-03 LAB — RENAL FUNCTION PANEL
ALBUMIN: 3.1 g/dL — AB (ref 3.5–5.0)
Anion gap: 11 (ref 5–15)
BUN: 63 mg/dL — ABNORMAL HIGH (ref 6–20)
CALCIUM: 9.2 mg/dL (ref 8.9–10.3)
CO2: 31 mmol/L (ref 22–32)
Chloride: 92 mmol/L — ABNORMAL LOW (ref 98–111)
Creatinine, Ser: 1.27 mg/dL — ABNORMAL HIGH (ref 0.44–1.00)
GFR calc Af Amer: 56 mL/min — ABNORMAL LOW (ref >60)
GFR, EST NON AFRICAN AMERICAN: 48 mL/min — AB (ref >60)
Glucose, Bld: 220 mg/dL — ABNORMAL HIGH (ref 70–99)
PHOSPHORUS: 4.6 mg/dL (ref 2.5–4.6)
Potassium: 4.6 mmol/L (ref 3.5–5.1)
Sodium: 134 mmol/L — ABNORMAL LOW (ref 135–145)

## 2018-11-03 LAB — CBC
HCT: 47 % — ABNORMAL HIGH (ref 36.0–46.0)
Hemoglobin: 15.7 g/dL — ABNORMAL HIGH (ref 12.0–15.0)
MCH: 32.4 pg (ref 26.0–34.0)
MCHC: 33.4 g/dL (ref 30.0–36.0)
MCV: 96.9 fL (ref 80.0–100.0)
Platelets: 360 10*3/uL (ref 150–400)
RBC: 4.85 MIL/uL (ref 3.87–5.11)
RDW: 13.2 % (ref 11.5–15.5)
WBC: 10.4 10*3/uL (ref 4.0–10.5)
nRBC: 0 % (ref 0.0–0.2)

## 2018-11-03 LAB — AMMONIA: Ammonia: 34 umol/L (ref 9–35)

## 2018-11-03 NOTE — Progress Notes (Signed)
Pulmonary Critical Care Medicine [place]   PULMONARY CRITICAL CARE SERVICE  PROGRESS NOTE  Date of Service: 11/03/2018  Theresa [name]  [id number]  DOB: [birth year]   DOA: 10/19/2018  Referring Physician: Merton Border, MD  HPI: Theresa [name] is a 53 y.o. female seen for follow up of Acute on Chronic Respiratory Failure.  Patient is on T collar chest x-ray showing increased fluid has been off the ventilator since about this 72 hours now currently requiring 60% FiO2  Medications: Reviewed on Rounds  Physical Exam:  Vitals: Temperature 97.9 pulse 79 respiratory 20 blood pressure 98/53 saturations 91%  Ventilator Settings currently on T collar FiO2 60%  . General: Comfortable at this time . Eyes: Grossly normal lids, irises & conjunctiva . ENT: grossly tongue is normal . Neck: no obvious mass . Cardiovascular: S1 S2 normal no gallop . Respiratory: Scattered rhonchi expansion is equal . Abdomen: soft . Skin: no rash seen on limited exam . Musculoskeletal: not rigid . Psychiatric:unable to assess . Neurologic: no seizure no involuntary movements         Lab Data:   Basic Metabolic Panel: Recent Labs  Lab 10/30/18 0537 11/01/18 0542 11/03/18 0603  NA 134* 134* 134*  K 4.4 4.5 4.6  CL 90* 90* 92*  CO2 30 31 31   GLUCOSE 204* 185* 220*  BUN 47* 61* 63*  CREATININE 1.11* 1.09* 1.27*  CALCIUM 9.5 9.3 9.2  MG 2.2 2.2  --   PHOS 4.4  --  4.6    ABG: No results for input(s): PHART, PCO2ART, PO2ART, HCO3, O2SAT in the last 168 hours.  Liver Function Tests: Recent Labs  Lab 11/03/18 0603  ALBUMIN 3.1*   No results for input(s): LIPASE, AMYLASE in the last 168 hours. Recent Labs  Lab 11/03/18 0603  AMMONIA 34    CBC: Recent Labs  Lab 10/30/18 0537 11/03/18 0603  WBC 10.7* 10.4  HGB 15.5* 15.7*  HCT 47.0* 47.0*  MCV 97.1 96.9  PLT 312 360    Cardiac Enzymes: No results for input(s): CKTOTAL, CKMB, CKMBINDEX, TROPONINI in the  last 168 hours.  BNP (last 3 results) Recent Labs    03/22/18 1434 10/14/18 2248  BNP 1,049.0* 396.0*    ProBNP (last 3 results) No results for input(s): PROBNP in the last 8760 hours.  Radiological Exams: No results found.  Assessment/Plan Active Problems:   Acute on chronic respiratory failure with hypoxia (HCC)   Acute systolic heart failure (HCC)   Cardiac arrest (HCC)   Acute kidney failure with lesion of tubular necrosis ([place])   Tracheostomy status ([place])   1. Acute on chronic respiratory failure with hypoxia we will continue with T collar trials wean FiO2 down patient needs more aggressive diuretics due to the fluid overload 2. Acute systolic heart failure as above diurese as tolerated monitor renal function 3. Acute renal failure monitor renal function 4. Cardiac arrest rhythm stable 5. Tracheostomy patient has been liberated from the ventilator   I have personally seen and evaluated the patient, evaluated laboratory and imaging results, formulated the assessment and plan and placed orders. The Patient requires high complexity decision making for assessment and support.  Case was discussed on Rounds with the Respiratory Therapy Staff  Allyne Gee, MD [facility] Pulmonary Critical Care Medicine Sleep Medicine

## 2018-11-04 LAB — BASIC METABOLIC PANEL
Anion gap: 10 (ref 5–15)
BUN: 74 mg/dL — ABNORMAL HIGH (ref 6–20)
CALCIUM: 9.3 mg/dL (ref 8.9–10.3)
CO2: 32 mmol/L (ref 22–32)
Chloride: 93 mmol/L — ABNORMAL LOW (ref 98–111)
Creatinine, Ser: 1.34 mg/dL — ABNORMAL HIGH (ref 0.44–1.00)
GFR calc Af Amer: 52 mL/min — ABNORMAL LOW (ref >60)
GFR calc non Af Amer: 45 mL/min — ABNORMAL LOW (ref >60)
Glucose, Bld: 182 mg/dL — ABNORMAL HIGH (ref 70–99)
Potassium: 4.7 mmol/L (ref 3.5–5.1)
Sodium: 135 mmol/L (ref 135–145)

## 2018-11-04 LAB — PROTIME-INR
INR: 1.9 — ABNORMAL HIGH (ref 0.8–1.2)
Prothrombin Time: 21.8 seconds — ABNORMAL HIGH (ref 11.4–15.2)

## 2018-11-04 NOTE — Progress Notes (Addendum)
Pulmonary Critical Care Medicine [address]   PULMONARY CRITICAL CARE SERVICE  PROGRESS NOTE  Date of Service: 11/04/2018  Theresa [name]  KWI:[id number]  DOB: 1965-11-20   DOA: 10/19/2018  Referring Physician: Merton Border, MD  HPI: Theresa [name] is a 53 y.o. female seen for follow up of Acute on Chronic Respiratory Failure.  Patient continues to do well off ventilator.  She is on T collar with an FiO2 of 50%.  Medications: Reviewed on Rounds  Physical Exam:  Vitals: Pulse 77 respirations 35 BP 103/62 O2 sat 91% temp 98.9  Ventilator Settings not currently on ventilator  . General: Comfortable at this time . Eyes: Grossly normal lids, irises & conjunctiva . ENT: grossly tongue is normal . Neck: no obvious mass . Cardiovascular: S1 S2 normal no gallop . Respiratory: Coarse breath sounds . Abdomen: soft . Skin: no rash seen on limited exam . Musculoskeletal: not rigid . Psychiatric:unable to assess . Neurologic: no seizure no involuntary movements         Lab Data:   Basic Metabolic Panel: Recent Labs  Lab 10/30/18 0537 11/01/18 0542 11/03/18 0603 11/04/18 0714  NA 134* 134* 134* 135  K 4.4 4.5 4.6 4.7  CL 90* 90* 92* 93*  CO2 30 31 31  32  GLUCOSE 204* 185* 220* 182*  BUN 47* 61* 63* 74*  CREATININE 1.11* 1.09* 1.27* 1.34*  CALCIUM 9.5 9.3 9.2 9.3  MG 2.2 2.2  --   --   PHOS 4.4  --  4.6  --     ABG: No results for input(s): PHART, PCO2ART, PO2ART, HCO3, O2SAT in the last 168 hours.  Liver Function Tests: Recent Labs  Lab 11/03/18 0603  ALBUMIN 3.1*   No results for input(s): LIPASE, AMYLASE in the last 168 hours. Recent Labs  Lab 11/03/18 0603  AMMONIA 34    CBC: Recent Labs  Lab 10/30/18 0537 11/03/18 0603  WBC 10.7* 10.4  HGB 15.5* 15.7*  HCT 47.0* 47.0*  MCV 97.1 96.9  PLT 312 360    Cardiac Enzymes: No results for input(s): CKTOTAL, CKMB, CKMBINDEX, TROPONINI in the last 168 hours.  BNP (last 3  results) Recent Labs    03/22/18 1434 10/14/18 2248  BNP 1,049.0* 396.0*    ProBNP (last 3 results) No results for input(s): PROBNP in the last 8760 hours.  Radiological Exams: No results found.  Assessment/Plan Active Problems:   Acute on chronic respiratory failure with hypoxia (HCC)   Acute systolic heart failure (HCC)   Cardiac arrest (HCC)   Acute kidney failure with lesion of tubular necrosis ([place])   Tracheostomy status ([place])   1. Acute on chronic respiratory failure with hypoxia continue with T collar trials and wean FiO2 as tolerated.  Continue aggressive pulmonary toilet and supportive measures. 2. Acute systolic heart failure as above diurese as tolerated and monitor renal function. 3. Acute renal failure monitor renal function 4. Cardiac arrest rhythm stable 5. Tracheostomy not currently on ventilator.   I have personally seen and evaluated the patient, evaluated laboratory and imaging results, formulated the assessment and plan and placed orders. The Patient requires high complexity decision making for assessment and support.  Case was discussed on Rounds with the Respiratory Therapy Staff  Allyne Gee, MD [facility] Pulmonary Critical Care Medicine Sleep Medicine

## 2018-11-05 LAB — BASIC METABOLIC PANEL
Anion gap: 11 (ref 5–15)
BUN: 62 mg/dL — ABNORMAL HIGH (ref 6–20)
CALCIUM: 9.6 mg/dL (ref 8.9–10.3)
CO2: 29 mmol/L (ref 22–32)
Chloride: 95 mmol/L — ABNORMAL LOW (ref 98–111)
Creatinine, Ser: 1.09 mg/dL — ABNORMAL HIGH (ref 0.44–1.00)
GFR calc Af Amer: >60 mL/min (ref >60)
GFR calc non Af Amer: 58 mL/min — ABNORMAL LOW (ref >60)
Glucose, Bld: 174 mg/dL — ABNORMAL HIGH (ref 70–99)
Potassium: 4.7 mmol/L (ref 3.5–5.1)
Sodium: 135 mmol/L (ref 135–145)

## 2018-11-05 LAB — CBC
HCT: 47.5 % — ABNORMAL HIGH (ref 36.0–46.0)
Hemoglobin: 15.6 g/dL — ABNORMAL HIGH (ref 12.0–15.0)
MCH: 31.7 pg (ref 26.0–34.0)
MCHC: 32.8 g/dL (ref 30.0–36.0)
MCV: 96.5 fL (ref 80.0–100.0)
NRBC: 0 % (ref 0.0–0.2)
Platelets: 380 10*3/uL (ref 150–400)
RBC: 4.92 MIL/uL (ref 3.87–5.11)
RDW: 12.9 % (ref 11.5–15.5)
WBC: 10.1 10*3/uL (ref 4.0–10.5)

## 2018-11-05 LAB — MAGNESIUM: Magnesium: 2.7 mg/dL — ABNORMAL HIGH (ref 1.7–2.4)

## 2018-11-05 LAB — PROTIME-INR
INR: 1.8 — ABNORMAL HIGH (ref 0.8–1.2)
Prothrombin Time: 20.7 seconds — ABNORMAL HIGH (ref 11.4–15.2)

## 2018-11-05 NOTE — Progress Notes (Addendum)
Pulmonary Critical Care Medicine [place]   PULMONARY CRITICAL CARE SERVICE  PROGRESS NOTE  Date of Service: 11/05/2018  ZYONNA VARDAMAN  GNF:[id number]  DOB: 08/09/1966   DOA: 10/19/2018  Referring Physician: Merton Border, MD  HPI: Theresa [name] is a 53 y.o. female seen for follow up of Acute on Chronic Respiratory Failure.  Patient mains off ventilator doing well this time on T collar FiO2 50%.  Medications: Reviewed on Rounds  Physical Exam:  Vitals: Pulse 77 respirations 16 BP 108/64 O2 sat 95% temp 97.1  Ventilator Settings patient's not currently on ventilator remains on T-bar 50% FiO2  . General: Comfortable at this time . Eyes: Grossly normal lids, irises & conjunctiva . ENT: grossly tongue is normal . Neck: no obvious mass . Cardiovascular: S1 S2 normal no gallop . Respiratory: Coarse breath sounds . Abdomen: soft . Skin: no rash seen on limited exam . Musculoskeletal: not rigid . Psychiatric:unable to assess . Neurologic: no seizure no involuntary movements         Lab Data:   Basic Metabolic Panel: Recent Labs  Lab 10/30/18 0537 11/01/18 0542 11/03/18 0603 11/04/18 0714 11/05/18 0450  NA 134* 134* 134* 135 135  K 4.4 4.5 4.6 4.7 4.7  CL 90* 90* 92* 93* 95*  CO2 30 31 31  32 29  GLUCOSE 204* 185* 220* 182* 174*  BUN 47* 61* 63* 74* 62*  CREATININE 1.11* 1.09* 1.27* 1.34* 1.09*  CALCIUM 9.5 9.3 9.2 9.3 9.6  MG 2.2 2.2  --   --  2.7*  PHOS 4.4  --  4.6  --   --     ABG: No results for input(s): PHART, PCO2ART, PO2ART, HCO3, O2SAT in the last 168 hours.  Liver Function Tests: Recent Labs  Lab 11/03/18 0603  ALBUMIN 3.1*   No results for input(s): LIPASE, AMYLASE in the last 168 hours. Recent Labs  Lab 11/03/18 0603  AMMONIA 34    CBC: Recent Labs  Lab 10/30/18 0537 11/03/18 0603 11/05/18 0450  WBC 10.7* 10.4 10.1  HGB 15.5* 15.7* 15.6*  HCT 47.0* 47.0* 47.5*  MCV 97.1 96.9 96.5  PLT 312 360 380     Cardiac Enzymes: No results for input(s): CKTOTAL, CKMB, CKMBINDEX, TROPONINI in the last 168 hours.  BNP (last 3 results) Recent Labs    03/22/18 1434 10/14/18 2248  BNP 1,049.0* 396.0*    ProBNP (last 3 results) No results for input(s): PROBNP in the last 8760 hours.  Radiological Exams: No results found.  Assessment/Plan Active Problems:   Acute on chronic respiratory failure with hypoxia (HCC)   Acute systolic heart failure (HCC)   Cardiac arrest (HCC)   Acute kidney failure with lesion of tubular necrosis ([place])   Tracheostomy status ([place])   1. Acute on chronic respiratory failure with hypoxia continue with T collar trials and wean FiO2 as tolerated.  Continue aggressive pulmonary toilet and supportive measures. 2. Acute systolic heart failure as above diuresis tolerated and monitor renal function. 3. Acute renal failure monitor renal function 4. Cardiac arrest rhythm stable 5. Tracheostomy not currently on ventilator   I have personally seen and evaluated the patient, evaluated laboratory and imaging results, formulated the assessment and plan and placed orders. The Patient requires high complexity decision making for assessment and support.  Case was discussed on Rounds with the Respiratory Therapy Staff  Allyne Gee, MD [facility] Pulmonary Critical Care Medicine Sleep Medicine

## 2018-11-06 ENCOUNTER — Other Ambulatory Visit (HOSPITAL_COMMUNITY): Payer: Medicare Other

## 2018-11-06 LAB — PROTIME-INR
INR: 1.6 — ABNORMAL HIGH (ref 0.8–1.2)
Prothrombin Time: 18.4 seconds — ABNORMAL HIGH (ref 11.4–15.2)

## 2018-11-06 NOTE — Progress Notes (Addendum)
Pulmonary Critical Care Medicine [place]   PULMONARY CRITICAL CARE SERVICE  PROGRESS NOTE  Date of Service: 11/06/2018  Theresa [name]  [id number]  DOB: 04/27/1966   DOA: 10/19/2018  Referring Physician: Merton Border, MD  HPI: Theresa [name] is a 53 y.o. female seen for follow up of Acute on Chronic Respiratory Failure.  Patient continues to do well with T collar 50% FiO2.  She is unable to tolerate the PMV and reports that her trach feels too small to breathe through.  She would like to be switched back to her Carter trach.  Her sister also reports that she and other family hours have been slipping the patient water and other things by mouth.  This would explain why her chest x-ray shows some decompensation.  Medications: Reviewed on Rounds  Physical Exam:  Vitals: Pulse 84 respirations 18 BP 119/64 O2 sat 92% temp 98.1  Ventilator Settings not currently on ventilator  . General: Comfortable at this time . Eyes: Grossly normal lids, irises & conjunctiva . ENT: grossly tongue is normal . Neck: no obvious mass . Cardiovascular: S1 S2 normal no gallop . Respiratory: Coarse breath sounds . Abdomen: soft . Skin: no rash seen on limited exam . Musculoskeletal: not rigid . Psychiatric:unable to assess . Neurologic: no seizure no involuntary movements         Lab Data:   Basic Metabolic Panel: Recent Labs  Lab 11/01/18 0542 11/03/18 0603 11/04/18 0714 11/05/18 0450  NA 134* 134* 135 135  K 4.5 4.6 4.7 4.7  CL 90* 92* 93* 95*  CO2 31 31 32 29  GLUCOSE 185* 220* 182* 174*  BUN 61* 63* 74* 62*  CREATININE 1.09* 1.27* 1.34* 1.09*  CALCIUM 9.3 9.2 9.3 9.6  MG 2.2  --   --  2.7*  PHOS  --  4.6  --   --     ABG: No results for input(s): PHART, PCO2ART, PO2ART, HCO3, O2SAT in the last 168 hours.  Liver Function Tests: Recent Labs  Lab 11/03/18 0603  ALBUMIN 3.1*   No results for input(s): LIPASE, AMYLASE in the last 168 hours. Recent Labs   Lab 11/03/18 0603  AMMONIA 34    CBC: Recent Labs  Lab 11/03/18 0603 11/05/18 0450  WBC 10.4 10.1  HGB 15.7* 15.6*  HCT 47.0* 47.5*  MCV 96.9 96.5  PLT 360 380    Cardiac Enzymes: No results for input(s): CKTOTAL, CKMB, CKMBINDEX, TROPONINI in the last 168 hours.  BNP (last 3 results) Recent Labs    03/22/18 1434 10/14/18 2248  BNP 1,049.0* 396.0*    ProBNP (last 3 results) No results for input(s): PROBNP in the last 8760 hours.  Radiological Exams: Dg Chest Port 1 View  Result Date: 11/06/2018 CLINICAL DATA:  53 year old female with acute on chronic respiratory failure. Trach collar. EXAM: PORTABLE CHEST 1 VIEW COMPARISON:  10/31/2018 and earlier. FINDINGS: Portable AP semi upright view at 0705 hours. Stable tracheostomy. Enteric tube is in place and courses to the abdomen, tip not included. Coronary artery stents are evident. Stable curvilinear staple line at the right lower chest. Increased pulmonary vascularity and obscuration of the diaphragm. No pneumothorax. No large pleural effusion. Negative visible bowel gas pattern. No acute osseous abnormality identified. IMPRESSION: 1. Increased pulmonary vascularity and obscuration of the diaphragm, suspicious for Acute Pulmonary Edema. 2. Stable tracheostomy.  Enteric tube courses to the abdomen. Electronically Signed   By: Genevie Ann M.D.   On: 11/06/2018 07:27  Assessment/Plan Active Problems:   Acute on chronic respiratory failure with hypoxia (HCC)   Acute systolic heart failure (HCC)   Cardiac arrest (HCC)   Acute kidney failure with lesion of tubular necrosis ([place])   Tracheostomy status ([place])   1. Acute on chronic respiratory failure with hypoxia continue with T collar trials and wean FiO2 as tolerated.  Continue aggressive pulmonary toilet and supportive measures.  Patient would like to discuss switching back to her [place] trach.  As she is unable to tolerate PMV at this time.  2. Acute systolic heart failure  continue diuresis as tolerated monitor renal function 3. Acute renal failure monitor renal function 4. Cardiac arrest rhythm stable 5. Tracheostomy no currently on ventilator   I have personally seen and evaluated the patient, evaluated laboratory and imaging results, formulated the assessment and plan and placed orders. The Patient requires high complexity decision making for assessment and support.  Case was discussed on Rounds with the Respiratory Therapy Staff  Allyne Gee, MD [facility] Pulmonary Critical Care Medicine Sleep Medicine

## 2018-11-07 ENCOUNTER — Other Ambulatory Visit (HOSPITAL_COMMUNITY): Payer: Medicare Other

## 2018-11-07 LAB — CBC
HCT: 46.7 % — ABNORMAL HIGH (ref 36.0–46.0)
HEMOGLOBIN: 15.3 g/dL — AB (ref 12.0–15.0)
MCH: 31.9 pg (ref 26.0–34.0)
MCHC: 32.8 g/dL (ref 30.0–36.0)
MCV: 97.5 fL (ref 80.0–100.0)
NRBC: 0 % (ref 0.0–0.2)
PLATELETS: 294 10*3/uL (ref 150–400)
RBC: 4.79 MIL/uL (ref 3.87–5.11)
RDW: 12.9 % (ref 11.5–15.5)
WBC: 10.3 10*3/uL (ref 4.0–10.5)

## 2018-11-07 LAB — RENAL FUNCTION PANEL
Albumin: 3.1 g/dL — ABNORMAL LOW (ref 3.5–5.0)
Anion gap: 13 (ref 5–15)
BUN: 66 mg/dL — ABNORMAL HIGH (ref 6–20)
CHLORIDE: 94 mmol/L — AB (ref 98–111)
CO2: 27 mmol/L (ref 22–32)
Calcium: 9.4 mg/dL (ref 8.9–10.3)
Creatinine, Ser: 1.31 mg/dL — ABNORMAL HIGH (ref 0.44–1.00)
GFR calc Af Amer: 54 mL/min — ABNORMAL LOW (ref >60)
GFR calc non Af Amer: 46 mL/min — ABNORMAL LOW (ref >60)
Glucose, Bld: 160 mg/dL — ABNORMAL HIGH (ref 70–99)
Phosphorus: 4.7 mg/dL — ABNORMAL HIGH (ref 2.5–4.6)
Potassium: 5.2 mmol/L — ABNORMAL HIGH (ref 3.5–5.1)
Sodium: 134 mmol/L — ABNORMAL LOW (ref 135–145)

## 2018-11-07 LAB — MAGNESIUM: MAGNESIUM: 2.5 mg/dL — AB (ref 1.7–2.4)

## 2018-11-07 LAB — PROTIME-INR
INR: 1.6 — ABNORMAL HIGH (ref 0.8–1.2)
Prothrombin Time: 18.6 seconds — ABNORMAL HIGH (ref 11.4–15.2)

## 2018-11-07 NOTE — Progress Notes (Signed)
Pulmonary Critical Care Medicine [place]   PULMONARY CRITICAL CARE SERVICE  PROGRESS NOTE  Date of Service: 11/07/2018  Theresa [name]  OTR:[id number]  DOB: 1965/11/30   DOA: 10/19/2018  Referring Physician: Merton Border, MD  HPI: Theresa [name] is a 53 y.o. female seen for follow up of Acute on Chronic Respiratory Failure.  Patient's family was at the bedside they were updated the mother and father were told regarding her progress.  They understand that she is not to eat apparently has been getting water and food by mouth.  She has been the x-ray the x-ray was showing a little bit of improvement but she is at risk for aspiration at this time.  Medications: Reviewed on Rounds  Physical Exam:  Vitals: Temperature 97.9 pulse 79 respiratory rate 26 blood pressure 100/64 saturations 93%  Ventilator Settings on T collar FiO2 40%  . General: Comfortable at this time . Eyes: Grossly normal lids, irises & conjunctiva . ENT: grossly tongue is normal . Neck: no obvious mass . Cardiovascular: S1 S2 normal no gallop . Respiratory: No rhonchi or rales are noted at this time . Abdomen: soft . Skin: no rash seen on limited exam . Musculoskeletal: not rigid . Psychiatric:unable to assess . Neurologic: no seizure no involuntary movements         Lab Data:   Basic Metabolic Panel: Recent Labs  Lab 11/01/18 0542 11/03/18 0603 11/04/18 0714 11/05/18 0450 11/07/18 0510  NA 134* 134* 135 135 134*  K 4.5 4.6 4.7 4.7 5.2*  CL 90* 92* 93* 95* 94*  CO2 31 31 32 29 27  GLUCOSE 185* 220* 182* 174* 160*  BUN 61* 63* 74* 62* 66*  CREATININE 1.09* 1.27* 1.34* 1.09* 1.31*  CALCIUM 9.3 9.2 9.3 9.6 9.4  MG 2.2  --   --  2.7* 2.5*  PHOS  --  4.6  --   --  4.7*    ABG: No results for input(s): PHART, PCO2ART, PO2ART, HCO3, O2SAT in the last 168 hours.  Liver Function Tests: Recent Labs  Lab 11/03/18 0603 11/07/18 0510  ALBUMIN 3.1* 3.1*   No results for input(s):  LIPASE, AMYLASE in the last 168 hours. Recent Labs  Lab 11/03/18 0603  AMMONIA 34    CBC: Recent Labs  Lab 11/03/18 0603 11/05/18 0450 11/07/18 0510  WBC 10.4 10.1 10.3  HGB 15.7* 15.6* 15.3*  HCT 47.0* 47.5* 46.7*  MCV 96.9 96.5 97.5  PLT 360 380 294    Cardiac Enzymes: No results for input(s): CKTOTAL, CKMB, CKMBINDEX, TROPONINI in the last 168 hours.  BNP (last 3 results) Recent Labs    03/22/18 1434 10/14/18 2248  BNP 1,049.0* 396.0*    ProBNP (last 3 results) No results for input(s): PROBNP in the last 8760 hours.  Radiological Exams: Dg Chest Port 1 View  Result Date: 11/07/2018 CLINICAL DATA:  Bilateral chest pain for 1 week. EXAM: PORTABLE CHEST 1 VIEW COMPARISON:  Single-view of the chest 11/06/2018 and 10/31/2018. FINDINGS: NG tube courses into the stomach and below the inferior margin the film. Tracheostomy tube remains in place. Bilateral airspace disease seen on yesterday's examination appears improved. There is cardiomegaly. The patient is status post CABG. No pneumothorax. There is likely a left pleural effusion. Suture material projecting over the lower right chest noted. IMPRESSION: Improved pulmonary edema. Likely left pleural effusion appears decreased. Cardiomegaly. Electronically Signed   By: Inge Rise M.D.   On: 11/07/2018 09:30   Dg Chest  Port 1 View  Result Date: 11/06/2018 CLINICAL DATA:  53 year old female with acute on chronic respiratory failure. Trach collar. EXAM: PORTABLE CHEST 1 VIEW COMPARISON:  10/31/2018 and earlier. FINDINGS: Portable AP semi upright view at 0705 hours. Stable tracheostomy. Enteric tube is in place and courses to the abdomen, tip not included. Coronary artery stents are evident. Stable curvilinear staple line at the right lower chest. Increased pulmonary vascularity and obscuration of the diaphragm. No pneumothorax. No large pleural effusion. Negative visible bowel gas pattern. No acute osseous abnormality identified.  IMPRESSION: 1. Increased pulmonary vascularity and obscuration of the diaphragm, suspicious for Acute Pulmonary Edema. 2. Stable tracheostomy.  Enteric tube courses to the abdomen. Electronically Signed   By: Genevie Ann M.D.   On: 11/06/2018 07:27   Dg Abd Portable 1v  Result Date: 11/06/2018 CLINICAL DATA:  NG tube placement EXAM: PORTABLE ABDOMEN - 1 VIEW COMPARISON:  10/26/2018 FINDINGS: Nasogastric tube tip and side port project over the stomach, with tip near the gastroduodenal junction. Nonobstructive bowel gas pattern. IMPRESSION: Nasogastric tube tip and side port project over the stomach. Electronically Signed   By: Ulyses Jarred M.D.   On: 11/06/2018 18:34    Assessment/Plan Active Problems:   Acute on chronic respiratory failure with hypoxia (HCC)   Acute systolic heart failure (HCC)   Cardiac arrest (HCC)   Acute kidney failure with lesion of tubular necrosis ([place])   Tracheostomy status ([place])   1. Acute on chronic respiratory failure with hypoxia we will continue with T collar change the trach over to a #4 cuffless trach. 2. Acute systolic heart failure last chest x-ray showing some increase in pulmonary vascularity.  She has significant fluid overload 3. Cardiac arrest rhythm is stable 4. Acute renal failure with ATN we will continue present management 5. Tracheostomy will be changed over to a #4 cuffed   I have personally seen and evaluated the patient, evaluated laboratory and imaging results, formulated the assessment and plan and placed orders. The Patient requires high complexity decision making for assessment and support.  Case was discussed on Rounds with the Respiratory Therapy Staff  Allyne Gee, MD [facility] Pulmonary Critical Care Medicine Sleep Medicine

## 2018-11-08 LAB — PROTIME-INR
INR: 1.8 — AB (ref 0.8–1.2)
Prothrombin Time: 20.2 seconds — ABNORMAL HIGH (ref 11.4–15.2)

## 2018-11-08 LAB — RENAL FUNCTION PANEL
Albumin: 3 g/dL — ABNORMAL LOW (ref 3.5–5.0)
Anion gap: 11 (ref 5–15)
BUN: 63 mg/dL — ABNORMAL HIGH (ref 6–20)
CO2: 30 mmol/L (ref 22–32)
CREATININE: 1.03 mg/dL — AB (ref 0.44–1.00)
Calcium: 9.6 mg/dL (ref 8.9–10.3)
Chloride: 93 mmol/L — ABNORMAL LOW (ref 98–111)
GFR calc non Af Amer: >60 mL/min (ref >60)
Glucose, Bld: 196 mg/dL — ABNORMAL HIGH (ref 70–99)
Phosphorus: 3.5 mg/dL (ref 2.5–4.6)
Potassium: 4.7 mmol/L (ref 3.5–5.1)
Sodium: 134 mmol/L — ABNORMAL LOW (ref 135–145)

## 2018-11-08 LAB — MAGNESIUM: Magnesium: 2.5 mg/dL — ABNORMAL HIGH (ref 1.7–2.4)

## 2018-11-08 NOTE — Progress Notes (Signed)
Pulmonary Critical Care Medicine [place]   PULMONARY CRITICAL CARE SERVICE  PROGRESS NOTE  Date of Service: 11/08/2018  URIAH TRUEBA  [id number]  DOB: 1966/01/17   DOA: 10/19/2018  Referring Physician: Merton Border, MD  HPI: LANIER FELTY is a 53 y.o. female seen for follow up of Acute on Chronic Respiratory Failure.  She is doing better have the trach changed over to a #4 cuffless seems to be tolerating this very well.  Medications: Reviewed on Rounds  Physical Exam:  Vitals: Temperature 97.0 pulse 72 respiratory 18 blood pressure 112/67 saturations 93%  Ventilator Settings currently on T collar FiO2 is 35%  . General: Comfortable at this time . Eyes: Grossly normal lids, irises & conjunctiva . ENT: grossly tongue is normal . Neck: no obvious mass . Cardiovascular: S1 S2 normal no gallop . Respiratory: No rhonchi or rales are noted at this time . Abdomen: soft . Skin: no rash seen on limited exam . Musculoskeletal: not rigid . Psychiatric:unable to assess . Neurologic: no seizure no involuntary movements         Lab Data:   Basic Metabolic Panel: Recent Labs  Lab 11/03/18 0603 11/04/18 0714 11/05/18 0450 11/07/18 0510 11/08/18 0536  NA 134* 135 135 134* 134*  K 4.6 4.7 4.7 5.2* 4.7  CL 92* 93* 95* 94* 93*  CO2 31 32 29 27 30   GLUCOSE 220* 182* 174* 160* 196*  BUN 63* 74* 62* 66* 63*  CREATININE 1.27* 1.34* 1.09* 1.31* 1.03*  CALCIUM 9.2 9.3 9.6 9.4 9.6  MG  --   --  2.7* 2.5* 2.5*  PHOS 4.6  --   --  4.7* 3.5    ABG: No results for input(s): PHART, PCO2ART, PO2ART, HCO3, O2SAT in the last 168 hours.  Liver Function Tests: Recent Labs  Lab 11/03/18 0603 11/07/18 0510 11/08/18 0536  ALBUMIN 3.1* 3.1* 3.0*   No results for input(s): LIPASE, AMYLASE in the last 168 hours. Recent Labs  Lab 11/03/18 0603  AMMONIA 34    CBC: Recent Labs  Lab 11/03/18 0603 11/05/18 0450 11/07/18 0510  WBC 10.4 10.1 10.3  HGB  15.7* 15.6* 15.3*  HCT 47.0* 47.5* 46.7*  MCV 96.9 96.5 97.5  PLT 360 380 294    Cardiac Enzymes: No results for input(s): CKTOTAL, CKMB, CKMBINDEX, TROPONINI in the last 168 hours.  BNP (last 3 results) Recent Labs    03/22/18 1434 10/14/18 2248  BNP 1,049.0* 396.0*    ProBNP (last 3 results) No results for input(s): PROBNP in the last 8760 hours.  Radiological Exams: Dg Chest Port 1 View  Result Date: 11/07/2018 CLINICAL DATA:  Bilateral chest pain for 1 week. EXAM: PORTABLE CHEST 1 VIEW COMPARISON:  Single-view of the chest 11/06/2018 and 10/31/2018. FINDINGS: NG tube courses into the stomach and below the inferior margin the film. Tracheostomy tube remains in place. Bilateral airspace disease seen on yesterday's examination appears improved. There is cardiomegaly. The patient is status post CABG. No pneumothorax. There is likely a left pleural effusion. Suture material projecting over the lower right chest noted. IMPRESSION: Improved pulmonary edema. Likely left pleural effusion appears decreased. Cardiomegaly. Electronically Signed   By: Inge Rise M.D.   On: 11/07/2018 09:30   Dg Abd Portable 1v  Result Date: 11/06/2018 CLINICAL DATA:  NG tube placement EXAM: PORTABLE ABDOMEN - 1 VIEW COMPARISON:  10/26/2018 FINDINGS: Nasogastric tube tip and side port project over the stomach, with tip near the gastroduodenal junction. Nonobstructive  bowel gas pattern. IMPRESSION: Nasogastric tube tip and side port project over the stomach. Electronically Signed   By: Ulyses Jarred M.D.   On: 11/06/2018 18:34    Assessment/Plan Active Problems:   Acute on chronic respiratory failure with hypoxia (HCC)   Acute systolic heart failure (HCC)   Cardiac arrest (HCC)   Acute kidney failure with lesion of tubular necrosis ([place])   Tracheostomy status ([place])   1. Acute on chronic respiratory failure with hypoxia we will continue with T collar on 35% FiO2.  She is likely going to need PEG tube  placement because of risk of aspiration which has been recurring.  Discussed with the primary care team and speech therapy 2. Acute systolic heart failure at baseline we will continue with present management 3. Cardiac arrest rhythm is stable at this time 4. Acute tubular necrosis resolved 5. Tracheostomy tolerating the smaller trach better   I have personally seen and evaluated the patient, evaluated laboratory and imaging results, formulated the assessment and plan and placed orders. The Patient requires high complexity decision making for assessment and support.  Case was discussed on Rounds with the Respiratory Therapy Staff  Allyne Gee, MD [facility] Pulmonary Critical Care Medicine Sleep Medicine

## 2018-11-09 ENCOUNTER — Other Ambulatory Visit (HOSPITAL_COMMUNITY): Payer: Medicare Other

## 2018-11-09 LAB — PROTIME-INR
INR: 2.2 — ABNORMAL HIGH (ref 0.8–1.2)
Prothrombin Time: 24 seconds — ABNORMAL HIGH (ref 11.4–15.2)

## 2018-11-09 NOTE — Progress Notes (Addendum)
Pulmonary Critical Care Medicine [place]   PULMONARY CRITICAL CARE SERVICE  PROGRESS NOTE  Date of Service: 11/09/2018  KENYETTE GUNDY  QMV:[id number]  DOB: December 08, 1965   DOA: 10/19/2018  Referring Physician: Merton Border, MD  HPI: Theresa [name] is a 53 y.o. female seen for follow up of Acute on Chronic Respiratory Failure.  Patient resting on ATC with FiO2 35% ATC tolerating PMV well at this time.  Medications: Reviewed on Rounds  Physical Exam:  Vitals: Pulse 70 respirations 18 BP 129/72 O2 sat 97% temp 98.4  Ventilator Settings not currently on ventilator  . General: Comfortable at this time . Eyes: Grossly normal lids, irises & conjunctiva . ENT: grossly tongue is normal . Neck: no obvious mass . Cardiovascular: S1 S2 normal no gallop . Respiratory: No rales or rhonchi noted . Abdomen: soft . Skin: no rash seen on limited exam . Musculoskeletal: not rigid . Psychiatric:unable to assess . Neurologic: no seizure no involuntary movements         Lab Data:   Basic Metabolic Panel: Recent Labs  Lab 11/03/18 0603 11/04/18 0714 11/05/18 0450 11/07/18 0510 11/08/18 0536  NA 134* 135 135 134* 134*  K 4.6 4.7 4.7 5.2* 4.7  CL 92* 93* 95* 94* 93*  CO2 31 32 29 27 30   GLUCOSE 220* 182* 174* 160* 196*  BUN 63* 74* 62* 66* 63*  CREATININE 1.27* 1.34* 1.09* 1.31* 1.03*  CALCIUM 9.2 9.3 9.6 9.4 9.6  MG  --   --  2.7* 2.5* 2.5*  PHOS 4.6  --   --  4.7* 3.5    ABG: No results for input(s): PHART, PCO2ART, PO2ART, HCO3, O2SAT in the last 168 hours.  Liver Function Tests: Recent Labs  Lab 11/03/18 0603 11/07/18 0510 11/08/18 0536  ALBUMIN 3.1* 3.1* 3.0*   No results for input(s): LIPASE, AMYLASE in the last 168 hours. Recent Labs  Lab 11/03/18 0603  AMMONIA 34    CBC: Recent Labs  Lab 11/03/18 0603 11/05/18 0450 11/07/18 0510  WBC 10.4 10.1 10.3  HGB 15.7* 15.6* 15.3*  HCT 47.0* 47.5* 46.7*  MCV 96.9 96.5 97.5  PLT 360 380  294    Cardiac Enzymes: No results for input(s): CKTOTAL, CKMB, CKMBINDEX, TROPONINI in the last 168 hours.  BNP (last 3 results) Recent Labs    03/22/18 1434 10/14/18 2248  BNP 1,049.0* 396.0*    ProBNP (last 3 results) No results for input(s): PROBNP in the last 8760 hours.  Radiological Exams: Ct Abdomen Wo Contrast  Result Date: 11/09/2018 CLINICAL DATA:  Dysphagia, assess anatomy for percutaneous gastrostomy tube placement EXAM: CT ABDOMEN WITHOUT CONTRAST TECHNIQUE: Multidetector CT imaging of the abdomen was performed following the standard protocol without IV contrast. COMPARISON:  07/31/2017 CT chest FINDINGS: Lower chest: Patchy bibasilar atelectasis and ground-glass opacities may represent component chronic edema/volume overload. Heart is enlarged. No pericardial or pleural effusion. Previous median sternotomy noted. NG tube enters the stomach. Esophagus is nondilated. Hepatobiliary: No focal hepatic abnormality within the limits of noncontrast imaging. Gallbladder is collapsed but does contain a calcified gallstone, image 24 series 3. No biliary dilatation or obstruction. Common bile duct nondilated. Pancreas: Unremarkable. No pancreatic ductal dilatation or surrounding inflammatory changes. Spleen: Normal in size without focal abnormality. Adrenals/Urinary Tract: Stable hypodense nodularity of the adrenal glands, suspect bilateral adenomas. Kidneys demonstrate no acute obstruction, hydronephrosis or perinephric inflammatory process. Ureters are symmetric and decompressed. No hydroureter. Stomach/Bowel: Stomach is positioned in the epigastric region inferior  to the left hepatic lobe and superior to the transverse colon. Stomach is just deep to the anterior abdominal wall and approachable for percutaneous gastrostomy technique. Negative for bowel obstruction, significant dilatation, ileus, or free air. Appendix not visualized. No abdominal free fluid, fluid collections, hemorrhage,  hematoma, abscess, or ascites. Vascular/Lymphatic: Heavy abdominal atherosclerosis. Negative for aneurysm. No retroperitoneal hemorrhage or hematoma. Limited assessment without IV contrast. No bulky adenopathy. Other: Intact abdominal wall.  No ventral hernia. Musculoskeletal: Degenerative changes of the spine. No acute osseous finding. IMPRESSION: Stomach anatomy is approachable for percutaneous gastrostomy technique as detailed above. Cholelithiasis Abdominal atherosclerosis Probable stable bilateral adrenal adenomas No other acute intra-abdominal finding by noncontrast CT. Electronically Signed   By: Jerilynn Mages.  Shick M.D.   On: 11/09/2018 08:45    Assessment/Plan Active Problems:   Acute on chronic respiratory failure with hypoxia (HCC)   Acute systolic heart failure (HCC)   Cardiac arrest (HCC)   Acute kidney failure with lesion of tubular necrosis ([facility])   Tracheostomy status ([place])   1. Acute on chronic respiratory failure with hypoxia continue with T: 35% FiO2.  She has been having recurring aspiration and will likely need a PEG tube placement in the future. 2. Acute systolic heart failure at baseline continue with present management 3. Cardiac arrest rhythm stable 4. Acute tubular necrosis resolved 5. Tracheostomy tolerating #4 cuffless well at this time.   I have personally seen and evaluated the patient, evaluated laboratory and imaging results, formulated the assessment and plan and placed orders. The Patient requires high complexity decision making for assessment and support.  Case was discussed on Rounds with the Respiratory Therapy Staff  Allyne Gee, MD [facility] Pulmonary Critical Care Medicine Sleep Medicine

## 2018-11-09 NOTE — Consult Note (Signed)
Chief Complaint: Patient was seen in consultation today for percutaneous gastric tube placement at the request of Dr Mariah Milling  Supervising Physician: Marybelle Killings  Patient Status: [facility] IP  History of Present Illness: Theresa [name] is a 53 y.o. female   VDRF/Trach 2002- respiratory failure Cardiac arrest DVT; +lupus anticoagulant on coumadin; LD 3/4 INR 2.2 today CVA CAD; CHF A/CKD  Vent/trach management  Dysphagia; malnutrition Need for long term care Request for percutaneous gastric tube placement Dr Annamaria Boots reviewed imaging and approves procedure   Past Medical History:  Diagnosis Date  . Acute kidney failure with lesion of tubular necrosis (HCC)   . Acute systolic heart failure ([place])   . CAD (coronary artery disease)    a. s/p prior PCI. b. CABG 2007 at [facility] in [place] 2007. c. inferior STEMI 10/2015 s/p DES to dSVG-PDA.  . Cardiac arrest ([place])   . Cervical cancer ([place])   . Chronic diastolic CHF (congestive heart failure) ([place])   . Chronic respiratory failure ([place])    s/p tracheostomy 2002  . Chronic RUQ pain   . COPD (chronic obstructive pulmonary disease) ([place])   . Diabetes mellitus ([place])   . DVT (deep venous thrombosis) ([place])   . Endometriosis   . History of gallstones 01/2016   seen on Ultrasound  . History of HIDA scan 11/2016   normal  . HTN (hypertension)   . Hyperlipidemia   . Lupus anticoagulant disorder (HCC)    on coumadin  . Morbid obesity ([place])   . ST elevation (STEMI) myocardial infarction involving right coronary artery ([place]) 10/29/15   stent to VG to PDA  . Toe fracture, right 03/29/2018  . Tracheostomy in place [facility]), chronic since 2002 11/03/2015  . Tracheostomy status [facility])     Past Surgical History:  Procedure Laterality Date  . CARDIAC CATHETERIZATION N/A 10/29/2015   Procedure: Left Heart Cath and Cors/Grafts Angiography;  Surgeon: Burnell Blanks, MD;  Location: [place] [facility];  Service:  Cardiovascular;  Laterality: N/A;  . CARDIAC CATHETERIZATION  10/29/2015   Procedure: Coronary Stent Intervention;  Surgeon: Burnell Blanks, MD;  Location: [place] [facility];  Service: Cardiovascular;;  . CAROTID STENT    . CESAREAN SECTION WITH BILATERAL TUBAL LIGATION    . CORONARY ARTERY BYPASS GRAFT  2007   2V  . RIGHT/LEFT HEART CATH AND CORONARY/GRAFT ANGIOGRAPHY N/A 03/24/2018   Procedure: RIGHT/LEFT HEART CATH AND CORONARY/GRAFT ANGIOGRAPHY;  Surgeon: Belva Crome, MD;  Location: [place] [facility];  Service: Cardiovascular;  Laterality: N/A;  . TRACHEOSTOMY      Allergies: Penicillins; Ciprofloxacin; and Ibuprofen  Medications: Prior to Admission medications   Medication Sig Start Date End Date Taking? Authorizing Provider  albuterol (PROVENTIL) (2.5 MG/3ML) 0.083% nebulizer solution Take 3 mLs (2.5 mg total) by nebulization 2 (two) times daily. 10/19/18   Erick Colace, NP  Chlorhexidine Gluconate Cloth 2 % PADS Apply 6 each topically daily at 6 (six) AM. 10/20/18   Erick Colace, NP  Dextrose-Sodium Chloride (DEXTROSE 5 % AND 0.45% NACL) infusion KVO 10/19/18   Erick Colace, NP  docusate (COLACE) 50 MG/5ML liquid Place 10 mLs (100 mg total) into feeding tube 2 (two) times daily as needed for mild constipation. 10/19/18   Erick Colace, NP  famotidine (PEPCID) 20-0.9 MG/50ML-% Inject 50 mLs (20 mg total) into the vein every 12 (twelve) hours. 10/19/18   Erick Colace, NP  fentaNYL (SUBLIMAZE) 100 MCG/2ML injection  Inject 1-4 mLs (50-200 mcg total) into the vein every 2 (two) hours as needed for severe pain. 10/19/18   Erick Colace, NP  furosemide (LASIX) 10 MG/ML injection Inject 8 mLs (80 mg total) into the vein every 8 (eight) hours. 10/19/18   Erick Colace, NP  gabapentin (NEURONTIN) 250 MG/5ML solution Place 6 mLs (300 mg total) into feeding tube daily. 10/19/18   Erick Colace, NP  heparin 25000-0.45 UT/250ML-% infusion Inject 1,250 Units/hr into  the vein continuous. 10/19/18   Erick Colace, NP  insulin aspart (NOVOLOG) 100 UNIT/ML injection Inject 0-9 Units into the skin every 4 (four) hours. 10/19/18   Erick Colace, NP  insulin aspart (NOVOLOG) 100 UNIT/ML injection Inject 2 Units into the skin every 4 (four) hours. 10/19/18   Erick Colace, NP  insulin glargine (LANTUS) 100 UNIT/ML injection Inject 0.18 mLs (18 Units total) into the skin daily. 10/19/18   Erick Colace, NP  nitroGLYCERIN (NITROSTAT) 0.4 MG SL tablet Place 0.4 mg under the tongue every 5 (five) minutes as needed for chest pain.    [provider]  Nutritional Supplements (FEEDING SUPPLEMENT, VITAL HIGH PROTEIN,) LIQD liquid Place 1,000 mLs into feeding tube daily. 10/20/18   Erick Colace, NP  polyethylene glycol (MIRALAX / GLYCOLAX) packet Take 17 g by mouth daily. 10/20/18   Erick Colace, NP  sennosides (SENOKOT) 8.8 MG/5ML syrup Place 5 mLs into feeding tube daily. 10/20/18   Erick Colace, NP  warfarin (COUMADIN) 5 MG tablet Take 1 tablet (5 mg total) by mouth one time only at 6 PM. 10/19/18   Erick Colace, NP     Family History  Problem Relation Age of Onset  . Hypertension Mother   . Diabetes Mother   . Hypertension Father   . Diabetes Father     Social History   Socioeconomic History  . Marital status: Married    Spouse name: Not on file  . Number of children: Not on file  . Years of education: Not on file  . Highest education level: Not on file  Occupational History  . Not on file  Social Needs  . Financial resource strain: Not on file  . Food insecurity:    Worry: Not on file    Inability: Not on file  . Transportation needs:    Medical: Not on file    Non-medical: Not on file  Tobacco Use  . Smoking status: Current Every Day Smoker    Packs/day: 0.75    Types: Cigarettes    Start date: 10/23/1978  . Smokeless tobacco: Never Used  . Tobacco comment: 1/2 pack - trying to cut back   Substance and Sexual Activity   . Alcohol use: No    Alcohol/week: 0.0 standard drinks  . Drug use: No  . Sexual activity: Not on file  Lifestyle  . Physical activity:    Days per week: Not on file    Minutes per session: Not on file  . Stress: Not on file  Relationships  . Social connections:    Talks on phone: Not on file    Gets together: Not on file    Attends religious service: Not on file    Active member of club or organization: Not on file    Attends meetings of clubs or organizations: Not on file    Relationship status: Not on file  Other Topics Concern  . Not on file  Social History Narrative  .  Not on file    Review of Systems: A 12 point ROS discussed and pertinent positives are indicated in the HPI above.  All other systems are negative.   Vital Signs: There were no vitals taken for this visit.  Physical Exam Vitals signs reviewed.  Cardiovascular:     Rate and Rhythm: Normal rate and regular rhythm.  Pulmonary:     Comments: Vent/trach Abdominal:     General: Abdomen is flat.  Musculoskeletal:     Comments: Weak, but moves all 4s  Skin:    General: Skin is warm and dry.  Neurological:     Mental Status: She is alert and oriented to person, place, and time.  Psychiatric:     Comments: Spoke to husband on phone Consented for procedure     Imaging: Ct Abdomen Wo Contrast  Result Date: 11/09/2018 CLINICAL DATA:  Dysphagia, assess anatomy for percutaneous gastrostomy tube placement EXAM: CT ABDOMEN WITHOUT CONTRAST TECHNIQUE: Multidetector CT imaging of the abdomen was performed following the standard protocol without IV contrast. COMPARISON:  07/31/2017 CT chest FINDINGS: Lower chest: Patchy bibasilar atelectasis and ground-glass opacities may represent component chronic edema/volume overload. Heart is enlarged. No pericardial or pleural effusion. Previous median sternotomy noted. NG tube enters the stomach. Esophagus is nondilated. Hepatobiliary: No focal hepatic abnormality within  the limits of noncontrast imaging. Gallbladder is collapsed but does contain a calcified gallstone, image 24 series 3. No biliary dilatation or obstruction. Common bile duct nondilated. Pancreas: Unremarkable. No pancreatic ductal dilatation or surrounding inflammatory changes. Spleen: Normal in size without focal abnormality. Adrenals/Urinary Tract: Stable hypodense nodularity of the adrenal glands, suspect bilateral adenomas. Kidneys demonstrate no acute obstruction, hydronephrosis or perinephric inflammatory process. Ureters are symmetric and decompressed. No hydroureter. Stomach/Bowel: Stomach is positioned in the epigastric region inferior to the left hepatic lobe and superior to the transverse colon. Stomach is just deep to the anterior abdominal wall and approachable for percutaneous gastrostomy technique. Negative for bowel obstruction, significant dilatation, ileus, or free air. Appendix not visualized. No abdominal free fluid, fluid collections, hemorrhage, hematoma, abscess, or ascites. Vascular/Lymphatic: Heavy abdominal atherosclerosis. Negative for aneurysm. No retroperitoneal hemorrhage or hematoma. Limited assessment without IV contrast. No bulky adenopathy. Other: Intact abdominal wall.  No ventral hernia. Musculoskeletal: Degenerative changes of the spine. No acute osseous finding. IMPRESSION: Stomach anatomy is approachable for percutaneous gastrostomy technique as detailed above. Cholelithiasis Abdominal atherosclerosis Probable stable bilateral adrenal adenomas No other acute intra-abdominal finding by noncontrast CT. Electronically Signed   By: Jerilynn Mages.  Shick M.D.   On: 11/09/2018 08:45   Dg Abd 1 View  Result Date: 10/16/2018 CLINICAL DATA:  Evaluate feeding tube placement EXAM: ABDOMEN - 1 VIEW COMPARISON:  None. FINDINGS: The feeding tube terminates just to the right of midline, likely in the distal stomach. IMPRESSION: The feeding tube terminates either in the distal gastric body or the  proximal antrum. Recommend repositioning/advancement before use. Electronically Signed   By: Dorise Bullion III M.D   On: 10/16/2018 15:48   Dg Chest Port 1 View  Result Date: 11/07/2018 CLINICAL DATA:  Bilateral chest pain for 1 week. EXAM: PORTABLE CHEST 1 VIEW COMPARISON:  Single-view of the chest 11/06/2018 and 10/31/2018. FINDINGS: NG tube courses into the stomach and below the inferior margin the film. Tracheostomy tube remains in place. Bilateral airspace disease seen on yesterday's examination appears improved. There is cardiomegaly. The patient is status post CABG. No pneumothorax. There is likely a left pleural effusion. Suture material projecting over the lower  right chest noted. IMPRESSION: Improved pulmonary edema. Likely left pleural effusion appears decreased. Cardiomegaly. Electronically Signed   By: Inge Rise M.D.   On: 11/07/2018 09:30   Dg Chest Port 1 View  Result Date: 11/06/2018 CLINICAL DATA:  53 year old female with acute on chronic respiratory failure. Trach collar. EXAM: PORTABLE CHEST 1 VIEW COMPARISON:  10/31/2018 and earlier. FINDINGS: Portable AP semi upright view at 0705 hours. Stable tracheostomy. Enteric tube is in place and courses to the abdomen, tip not included. Coronary artery stents are evident. Stable curvilinear staple line at the right lower chest. Increased pulmonary vascularity and obscuration of the diaphragm. No pneumothorax. No large pleural effusion. Negative visible bowel gas pattern. No acute osseous abnormality identified. IMPRESSION: 1. Increased pulmonary vascularity and obscuration of the diaphragm, suspicious for Acute Pulmonary Edema. 2. Stable tracheostomy.  Enteric tube courses to the abdomen. Electronically Signed   By: Genevie Ann M.D.   On: 11/06/2018 07:27   Dg Chest Port 1 View  Result Date: 10/31/2018 CLINICAL DATA:  Hypoxia. Sepsis. Rash. Possible fever. EXAM: PORTABLE CHEST 1 VIEW COMPARISON:  10/19/2018. FINDINGS: Grossly stable  enlarged cardiac silhouette, post CABG changes and coronary artery stents. Nasogastric tube extending into the stomach. Tracheostomy tube in satisfactory position. Right basilar surgical staples are unchanged. Mild linear density at both lung bases. Decreased prominence of the interstitial markings. Probable small bilateral pleural effusions with improvement. Unremarkable bones. IMPRESSION: 1. Improving changes of congestive heart failure. 2. Mild bibasilar atelectasis. Electronically Signed   By: Claudie Revering M.D.   On: 10/31/2018 16:31   Dg Chest Port 1 View  Result Date: 10/19/2018 CLINICAL DATA:  Acute hypoxemic respiratory failure. Follow-up exam. EXAM: PORTABLE CHEST 1 VIEW COMPARISON:  10/18/2018 and prior studies. FINDINGS: Vascular congestion, bilateral interstitial thickening and mild hazy opacity at the lung bases is similar to the prior exam. Probable small pleural effusions. There are stable anastomosis staples at the right lung base. Cardiac silhouette is enlarged. Tracheostomy tube and enteric feeding tube are stable. IMPRESSION: 1. No significant change when compared to the previous day's study. Cardiomegaly with vascular Jensen interstitial edema consistent with CHF. Support apparatus is stable. Electronically Signed   By: Lajean Manes M.D.   On: 10/19/2018 13:58   Dg Chest Port 1 View  Result Date: 10/18/2018 CLINICAL DATA:  Hypoxia EXAM: PORTABLE CHEST 1 VIEW COMPARISON:  October 17, 2018 FINDINGS: Tracheostomy catheter tip is 2.0 cm above the carina. Feeding tube tip is below the diaphragm. No pneumothorax. There is cardiomegaly with pulmonary venous hypertension. There is interstitial edema with small pleural effusions bilaterally. There is postoperative change in the right base. No evident adenopathy. No bone lesions. IMPRESSION: Tube positions as described without pneumothorax. There is pulmonary vascular congestion with moderate interstitial edema. No frank consolidation. Small  pleural effusions bilaterally. Electronically Signed   By: Lowella Grip III M.D.   On: 10/18/2018 08:14   Dg Chest Port 1 View  Result Date: 10/17/2018 CLINICAL DATA:  Respiratory failure. EXAM: PORTABLE CHEST 1 VIEW COMPARISON:  Radiograph of October 15, 2018. FINDINGS: Stable cardiomegaly. Tracheostomy tube is unchanged in position. Interval placement of feeding tube with distal tip in expected position of stomach. No pneumothorax is noted. Central pulmonary vascular congestion is noted with possible bilateral pulmonary edema or possibly pneumonia. Minimal pleural effusions are noted. Bony thorax is unremarkable. IMPRESSION: Stable cardiomegaly with central pulmonary vascular congestion. Interval placement of feeding tube with distal tip in expected position of stomach. Stable bilateral lung opacities  are noted concerning for edema or possibly pneumonia. Electronically Signed   By: Marijo Conception, M.D.   On: 10/17/2018 07:26   Dg Chest Port 1 View  Result Date: 10/15/2018 CLINICAL DATA:  Tracheostomy tube EXAM: PORTABLE CHEST 1 VIEW COMPARISON:  Chest radiograph 10/15/2018 FINDINGS: Tracheostomy tube terminates in the mid trachea. Monitoring leads overlie the patient. Stable cardiomegaly status post median sternotomy. Similar-appearing right-greater-than-left patchy consolidation interstitial opacities. Small bilateral pleural effusions. Postsurgical changes right lower hemithorax. No pneumothorax. Thoracic spine degenerative changes. IMPRESSION: Tracheostomy tube terminates in the mid trachea. Similar-appearing bilateral patchy areas of consolidation and interstitial opacities which may represent edema and/or infection. Small bilateral effusions. Electronically Signed   By: Lovey Newcomer M.D.   On: 10/15/2018 14:45   Dg Chest Port 1 View  Result Date: 10/15/2018 CLINICAL DATA:  Endotracheal tube placement. EXAM: PORTABLE CHEST 1 VIEW COMPARISON:  Earlier film, same date. FINDINGS: The endotracheal  tube has been retracted. It is 12 mm above the carina and could be retracted further. Interval re-aeration of the left lung. Persistent diffuse but asymmetric interstitial and airspace process, likely pulmonary edema. Suspect small effusions. IMPRESSION: 1. The endotracheal tube is 12 mm above the carina and could be retracted another 2 or 3 cm. 2. Re-aeration of the left lung. 3. Persistent diffuse but asymmetric interstitial and airspace process, likely pulmonary edema. Electronically Signed   By: Marijo Sanes M.D.   On: 10/15/2018 10:33   Dg Chest Port 1 View  Result Date: 10/15/2018 CLINICAL DATA:  Status post CPR. EXAM: PORTABLE CHEST 1 VIEW COMPARISON:  10/14/2018 FINDINGS: The endotracheal tube is in the right mainstem bronchus and needs to be retracted approximately 5 cm. Associated complete opacification of the left hemithorax. Persistent diffuse interstitial and airspace process likely pulmonary edema. Small bilateral pleural effusions. Surgical changes noted at the right lung base. IMPRESSION: The endotracheal tube is in the right mainstem bronchus and needs to be retracted approximately 5 cm. Associated complete opacification of the left hemithorax. Persistent underlying diffuse interstitial and airspace process, likely edema. These results were called by telephone at the time of interpretation on 10/15/2018 at 4:41 am to Okay in the ICU , who verbally acknowledged these results. Electronically Signed   By: Marijo Sanes M.D.   On: 10/15/2018 04:41   Dg Chest Port 1 View  Result Date: 10/14/2018 CLINICAL DATA:  Respiratory distress EXAM: PORTABLE CHEST 1 VIEW COMPARISON:  03/22/2018 chest radiograph. FINDINGS: Tracheostomy tube terminates over the tracheal air column at the level of the thoracic inlet. Intact sternotomy wires. Stable cardiomediastinal silhouette with mild cardiomegaly. No pneumothorax. No pleural effusion. Diffuse fluffy opacity throughout both lungs, worsened. IMPRESSION:  Stable mild cardiomegaly. Diffuse fluffy opacity throughout both lungs, favor moderate to severe pulmonary edema due to congestive heart failure. Electronically Signed   By: Ilona Sorrel M.D.   On: 10/14/2018 23:07   Dg Abd Portable 1v  Result Date: 11/06/2018 CLINICAL DATA:  NG tube placement EXAM: PORTABLE ABDOMEN - 1 VIEW COMPARISON:  10/26/2018 FINDINGS: Nasogastric tube tip and side port project over the stomach, with tip near the gastroduodenal junction. Nonobstructive bowel gas pattern. IMPRESSION: Nasogastric tube tip and side port project over the stomach. Electronically Signed   By: Ulyses Jarred M.D.   On: 11/06/2018 18:34   Dg Abd Portable 1v  Result Date: 10/26/2018 CLINICAL DATA:  Nasogastric tube placement EXAM: PORTABLE ABDOMEN - 1 VIEW COMPARISON:  October 19, 2018 FINDINGS: Nasogastric tube tip and side port  are in the body of the stomach. There is no bowel dilatation or air-fluid level to suggest bowel obstruction. No free air. There is postoperative change in the right lung base. IMPRESSION: Nasogastric tube tip and side port in body of stomach. No evident bowel obstruction or free air. Electronically Signed   By: Lowella Grip III M.D.   On: 10/26/2018 12:17   Dg Abd Portable 1v  Result Date: 10/19/2018 CLINICAL DATA:  Feeding tube placement EXAM: PORTABLE ABDOMEN - 1 VIEW COMPARISON:  10/16/2018 FINDINGS: Feeding tube tip is in the distal stomach or proximal duodenum. Gas throughout mildly prominent large bowel. No evidence of bowel obstruction. IMPRESSION: Feeding tube tip in the distal stomach or proximal duodenum. Electronically Signed   By: Rolm Baptise M.D.   On: 10/19/2018 22:51    Labs:  CBC: Recent Labs    10/30/18 0537 11/03/18 0603 11/05/18 0450 11/07/18 0510  WBC 10.7* 10.4 10.1 10.3  HGB 15.5* 15.7* 15.6* 15.3*  HCT 47.0* 47.0* 47.5* 46.7*  PLT 312 360 380 294    COAGS: Recent Labs    10/24/18 0541  11/06/18 0514 11/07/18 0510 11/08/18 0559  11/09/18 0613  INR  --    < > 1.6* 1.6* 1.8* 2.2*  APTT 79*  --   --   --   --   --    < > = values in this interval not displayed.    BMP: Recent Labs    11/04/18 0714 11/05/18 0450 11/07/18 0510 11/08/18 0536  NA 135 135 134* 134*  K 4.7 4.7 5.2* 4.7  CL 93* 95* 94* 93*  CO2 32 29 27 30   GLUCOSE 182* 174* 160* 196*  BUN 74* 62* 66* 63*  CALCIUM 9.3 9.6 9.4 9.6  CREATININE 1.34* 1.09* 1.31* 1.03*  GFRNONAA 45* 58* 46* >60  GFRAA 52* >60 54* >60    LIVER FUNCTION TESTS: Recent Labs    10/14/18 2247 11/03/18 0603 11/07/18 0510 11/08/18 0536  BILITOT 0.8  --   --   --   AST 38  --   --   --   ALT 27  --   --   --   ALKPHOS 133*  --   --   --   PROT 8.1  --   --   --   ALBUMIN 3.5 3.1* 3.1* 3.0*    TUMOR MARKERS: No results for input(s): AFPTM, CEA, CA199, CHROMGRNA in the last 8760 hours.  Assessment and Plan:  Dysphagia; malnutrition Long term care Scheduled for percutaneous gastric tube placement in IR (poss Monday-- will check INR) Risks and benefits discussed with the patient and husband by phone including, but not limited to the need for a barium enema during the procedure, bleeding, infection, peritonitis, or damage to adjacent structures.  All of the patient's and husbands questions were answered, patient is agreeable to proceed. Consent signed and in chart.   Thank you for this interesting consult.  I greatly enjoyed meeting Theresa [name] and look forward to participating in their care.  A copy of this report was sent to the requesting provider on this date.  Electronically Signed: Lavonia Drafts, PA-C 11/09/2018, 10:57 AM   I spent a total of 40 Minutes    in face to face in clinical consultation, greater than 50% of which was counseling/coordinating care for percutaneous gastric tube placement

## 2018-11-10 LAB — CBC
HEMATOCRIT: 44.6 % (ref 36.0–46.0)
HEMOGLOBIN: 14.8 g/dL (ref 12.0–15.0)
MCH: 31.7 pg (ref 26.0–34.0)
MCHC: 33.2 g/dL (ref 30.0–36.0)
MCV: 95.5 fL (ref 80.0–100.0)
Platelets: 266 10*3/uL (ref 150–400)
RBC: 4.67 MIL/uL (ref 3.87–5.11)
RDW: 12.5 % (ref 11.5–15.5)
WBC: 10.8 10*3/uL — ABNORMAL HIGH (ref 4.0–10.5)
nRBC: 0 % (ref 0.0–0.2)

## 2018-11-10 LAB — RENAL FUNCTION PANEL
Albumin: 3.1 g/dL — ABNORMAL LOW (ref 3.5–5.0)
Anion gap: 11 (ref 5–15)
BUN: 43 mg/dL — ABNORMAL HIGH (ref 6–20)
CO2: 28 mmol/L (ref 22–32)
Calcium: 9.6 mg/dL (ref 8.9–10.3)
Chloride: 93 mmol/L — ABNORMAL LOW (ref 98–111)
Creatinine, Ser: 0.92 mg/dL (ref 0.44–1.00)
GFR calc Af Amer: >60 mL/min (ref >60)
GFR calc non Af Amer: >60 mL/min (ref >60)
Glucose, Bld: 177 mg/dL — ABNORMAL HIGH (ref 70–99)
POTASSIUM: 4.4 mmol/L (ref 3.5–5.1)
Phosphorus: 4 mg/dL (ref 2.5–4.6)
SODIUM: 132 mmol/L — AB (ref 135–145)

## 2018-11-10 LAB — PROTIME-INR
INR: 2.1 — AB (ref 0.8–1.2)
Prothrombin Time: 23.6 seconds — ABNORMAL HIGH (ref 11.4–15.2)

## 2018-11-10 LAB — MAGNESIUM: Magnesium: 2.2 mg/dL (ref 1.7–2.4)

## 2018-11-10 NOTE — Progress Notes (Signed)
Pulmonary Critical Care Medicine [facility]   PULMONARY CRITICAL CARE SERVICE  PROGRESS NOTE  Date of Service: 11/10/2018  Theresa [name]  HOZ:[id number]  DOB: [birth year]   DOA: 10/19/2018  Referring Physician: Merton Border, MD  HPI: Theresa [name] is a 53 y.o. female seen for follow up of Acute on Chronic Respiratory Failure.  Currently is on T collar and 35% FiO2 sitting up in the chair she is also using the PMV and has been tolerating it well  Medications: Reviewed on Rounds  Physical Exam:  Vitals: Temperature 98.9 pulse 86 respiratory 13 blood pressure 147/47 saturations 100%  Ventilator Settings off the ventilator on T collar FiO2 is 35%  . General: Comfortable at this time . Eyes: Grossly normal lids, irises & conjunctiva . ENT: grossly tongue is normal . Neck: no obvious mass . Cardiovascular: S1 S2 normal no gallop . Respiratory: No rhonchi or rales are noted at this time . Abdomen: soft . Skin: no rash seen on limited exam . Musculoskeletal: not rigid . Psychiatric:unable to assess . Neurologic: no seizure no involuntary movements         Lab Data:   Basic Metabolic Panel: Recent Labs  Lab 11/04/18 0714 11/05/18 0450 11/07/18 0510 11/08/18 0536 11/10/18 0511  NA 135 135 134* 134* 132*  K 4.7 4.7 5.2* 4.7 4.4  CL 93* 95* 94* 93* 93*  CO2 32 29 27 30 28   GLUCOSE 182* 174* 160* 196* 177*  BUN 74* 62* 66* 63* 43*  CREATININE 1.34* 1.09* 1.31* 1.03* 0.92  CALCIUM 9.3 9.6 9.4 9.6 9.6  MG  --  2.7* 2.5* 2.5* 2.2  PHOS  --   --  4.7* 3.5 4.0    ABG: No results for input(s): PHART, PCO2ART, PO2ART, HCO3, O2SAT in the last 168 hours.  Liver Function Tests: Recent Labs  Lab 11/07/18 0510 11/08/18 0536 11/10/18 0511  ALBUMIN 3.1* 3.0* 3.1*   No results for input(s): LIPASE, AMYLASE in the last 168 hours. No results for input(s): AMMONIA in the last 168 hours.  CBC: Recent Labs  Lab 11/05/18 0450 11/07/18 0510 11/10/18 0511   WBC 10.1 10.3 10.8*  HGB 15.6* 15.3* 14.8  HCT 47.5* 46.7* 44.6  MCV 96.5 97.5 95.5  PLT 380 294 266    Cardiac Enzymes: No results for input(s): CKTOTAL, CKMB, CKMBINDEX, TROPONINI in the last 168 hours.  BNP (last 3 results) Recent Labs    03/22/18 1434 10/14/18 2248  BNP 1,049.0* 396.0*    ProBNP (last 3 results) No results for input(s): PROBNP in the last 8760 hours.  Radiological Exams: Ct Abdomen Wo Contrast  Result Date: 11/09/2018 CLINICAL DATA:  Dysphagia, assess anatomy for percutaneous gastrostomy tube placement EXAM: CT ABDOMEN WITHOUT CONTRAST TECHNIQUE: Multidetector CT imaging of the abdomen was performed following the standard protocol without IV contrast. COMPARISON:  07/31/2017 CT chest FINDINGS: Lower chest: Patchy bibasilar atelectasis and ground-glass opacities may represent component chronic edema/volume overload. Heart is enlarged. No pericardial or pleural effusion. Previous median sternotomy noted. NG tube enters the stomach. Esophagus is nondilated. Hepatobiliary: No focal hepatic abnormality within the limits of noncontrast imaging. Gallbladder is collapsed but does contain a calcified gallstone, image 24 series 3. No biliary dilatation or obstruction. Common bile duct nondilated. Pancreas: Unremarkable. No pancreatic ductal dilatation or surrounding inflammatory changes. Spleen: Normal in size without focal abnormality. Adrenals/Urinary Tract: Stable hypodense nodularity of the adrenal glands, suspect bilateral adenomas. Kidneys demonstrate no acute obstruction, hydronephrosis or perinephric inflammatory  process. Ureters are symmetric and decompressed. No hydroureter. Stomach/Bowel: Stomach is positioned in the epigastric region inferior to the left hepatic lobe and superior to the transverse colon. Stomach is just deep to the anterior abdominal wall and approachable for percutaneous gastrostomy technique. Negative for bowel obstruction, significant dilatation,  ileus, or free air. Appendix not visualized. No abdominal free fluid, fluid collections, hemorrhage, hematoma, abscess, or ascites. Vascular/Lymphatic: Heavy abdominal atherosclerosis. Negative for aneurysm. No retroperitoneal hemorrhage or hematoma. Limited assessment without IV contrast. No bulky adenopathy. Other: Intact abdominal wall.  No ventral hernia. Musculoskeletal: Degenerative changes of the spine. No acute osseous finding. IMPRESSION: Stomach anatomy is approachable for percutaneous gastrostomy technique as detailed above. Cholelithiasis Abdominal atherosclerosis Probable stable bilateral adrenal adenomas No other acute intra-abdominal finding by noncontrast CT. Electronically Signed   By: Jerilynn Mages.  Shick M.D.   On: 11/09/2018 08:45    Assessment/Plan Active Problems:   Acute on chronic respiratory failure with hypoxia (HCC)   Acute systolic heart failure (HCC)   Cardiac arrest (HCC)   Acute kidney failure with lesion of tubular necrosis ([place])   Tracheostomy status ([place])   1. Acute on chronic respiratory failure with hypoxia we will continue T collar she is at her baseline.  Continue supportive care 2. Acute systolic heart failure right now is compensated 3. Cardiac arrest rhythm stable at this time 4. ATN improved continue with supportive care 5. Tracheostomy status remains in place   I have personally seen and evaluated the patient, evaluated laboratory and imaging results, formulated the assessment and plan and placed orders. The Patient requires high complexity decision making for assessment and support.  Case was discussed on Rounds with the Respiratory Therapy Staff  Allyne Gee, MD [facility] Pulmonary Critical Care Medicine Sleep Medicine

## 2018-11-11 NOTE — Progress Notes (Addendum)
Pulmonary Critical Care Medicine [place]   PULMONARY CRITICAL CARE SERVICE  PROGRESS NOTE  Date of Service: 11/11/2018  MATTINGLY FOUNTAINE  CHY:[id number]  DOB: 1966-02-27   DOA: 10/19/2018  Referring Physician: Merton Border, MD  HPI: Theresa [name] is a 53 y.o. female seen for follow up of Acute on Chronic Respiratory Failure.  Patient overall doing well.  She is scheduled for PEG placement on Monday.  She is currently on aerosol trach collar 35% FiO2 and using her PMV.  Medications: Reviewed on Rounds  Physical Exam:  Vitals: Pulse 79 respiration 24 BP 131/71 O2 sat 95% temp 97 4  Ventilator Settings not currently on ventilator  . General: Comfortable at this time . Eyes: Grossly normal lids, irises & conjunctiva . ENT: grossly tongue is normal . Neck: no obvious mass . Cardiovascular: S1 S2 normal no gallop . Respiratory: No rales or rhonchi noted . Abdomen: soft . Skin: no rash seen on limited exam . Musculoskeletal: not rigid . Psychiatric:unable to assess . Neurologic: no seizure no involuntary movements         Lab Data:   Basic Metabolic Panel: Recent Labs  Lab 11/05/18 0450 11/07/18 0510 11/08/18 0536 11/10/18 0511  NA 135 134* 134* 132*  K 4.7 5.2* 4.7 4.4  CL 95* 94* 93* 93*  CO2 29 27 30 28   GLUCOSE 174* 160* 196* 177*  BUN 62* 66* 63* 43*  CREATININE 1.09* 1.31* 1.03* 0.92  CALCIUM 9.6 9.4 9.6 9.6  MG 2.7* 2.5* 2.5* 2.2  PHOS  --  4.7* 3.5 4.0    ABG: No results for input(s): PHART, PCO2ART, PO2ART, HCO3, O2SAT in the last 168 hours.  Liver Function Tests: Recent Labs  Lab 11/07/18 0510 11/08/18 0536 11/10/18 0511  ALBUMIN 3.1* 3.0* 3.1*   No results for input(s): LIPASE, AMYLASE in the last 168 hours. No results for input(s): AMMONIA in the last 168 hours.  CBC: Recent Labs  Lab 11/05/18 0450 11/07/18 0510 11/10/18 0511  WBC 10.1 10.3 10.8*  HGB 15.6* 15.3* 14.8  HCT 47.5* 46.7* 44.6  MCV 96.5 97.5 95.5   PLT 380 294 266    Cardiac Enzymes: No results for input(s): CKTOTAL, CKMB, CKMBINDEX, TROPONINI in the last 168 hours.  BNP (last 3 results) Recent Labs    03/22/18 1434 10/14/18 2248  BNP 1,049.0* 396.0*    ProBNP (last 3 results) No results for input(s): PROBNP in the last 8760 hours.  Radiological Exams: No results found.  Assessment/Plan Active Problems:   Acute on chronic respiratory failure with hypoxia (HCC)   Acute systolic heart failure (HCC)   Cardiac arrest (HCC)   Acute kidney failure with lesion of tubular necrosis ([place])   Tracheostomy status ([place])   1. Acute on chronic respiratory failure with hypoxia continue T collar at this time.  This is patient's baseline.  Continue supportive care and pulmonary toilet. 2. Acute distal heart failure compensated 3. Cardiac arrest rhythm stable 4. ATN improving tinea supportive care 5. Tracheostomy remains in place   I have personally seen and evaluated the patient, evaluated laboratory and imaging results, formulated the assessment and plan and placed orders. The Patient requires high complexity decision making for assessment and support.  Case was discussed on Rounds with the Respiratory Therapy Staff  Allyne Gee, MD [facility] Pulmonary Critical Care Medicine Sleep Medicine

## 2018-11-12 NOTE — Progress Notes (Addendum)
Pulmonary Critical Care Medicine [place]   PULMONARY CRITICAL CARE SERVICE  PROGRESS NOTE  Date of Service: 11/12/2018  Theresa [name]  GYB:[id number]  DOB: [birth year]   DOA: 10/19/2018  Referring Physician: Merton Border, MD  HPI: Theresa [name] is a 53 y.o. female seen for follow up of Acute on Chronic Respiratory Failure.  Patient remains on T collar 35% FiO2.  Has PMV and is using it well.  Scheduled for PEG placement tomorrow.  Medications: Reviewed on Rounds  Physical Exam:  Vitals: Pulse 109 respirations 22 BP 129/68 O2 sat 99% temp 97.8  Ventilator Settings not currently on ventilator  . General: Comfortable at this time . Eyes: Grossly normal lids, irises & conjunctiva . ENT: grossly tongue is normal . Neck: no obvious mass . Cardiovascular: S1 S2 normal no gallop . Respiratory: No rales or rhonchi noted . Abdomen: soft . Skin: no rash seen on limited exam . Musculoskeletal: not rigid . Psychiatric:unable to assess . Neurologic: no seizure no involuntary movements         Lab Data:   Basic Metabolic Panel: Recent Labs  Lab 11/07/18 0510 11/08/18 0536 11/10/18 0511  NA 134* 134* 132*  K 5.2* 4.7 4.4  CL 94* 93* 93*  CO2 27 30 28   GLUCOSE 160* 196* 177*  BUN 66* 63* 43*  CREATININE 1.31* 1.03* 0.92  CALCIUM 9.4 9.6 9.6  MG 2.5* 2.5* 2.2  PHOS 4.7* 3.5 4.0    ABG: No results for input(s): PHART, PCO2ART, PO2ART, HCO3, O2SAT in the last 168 hours.  Liver Function Tests: Recent Labs  Lab 11/07/18 0510 11/08/18 0536 11/10/18 0511  ALBUMIN 3.1* 3.0* 3.1*   No results for input(s): LIPASE, AMYLASE in the last 168 hours. No results for input(s): AMMONIA in the last 168 hours.  CBC: Recent Labs  Lab 11/07/18 0510 11/10/18 0511  WBC 10.3 10.8*  HGB 15.3* 14.8  HCT 46.7* 44.6  MCV 97.5 95.5  PLT 294 266    Cardiac Enzymes: No results for input(s): CKTOTAL, CKMB, CKMBINDEX, TROPONINI in the last 168 hours.  BNP  (last 3 results) Recent Labs    03/22/18 1434 10/14/18 2248  BNP 1,049.0* 396.0*    ProBNP (last 3 results) No results for input(s): PROBNP in the last 8760 hours.  Radiological Exams: No results found.  Assessment/Plan Active Problems:   Acute on chronic respiratory failure with hypoxia (HCC)   Acute systolic heart failure (HCC)   Cardiac arrest (HCC)   Acute kidney failure with lesion of tubular necrosis ([place])   Tracheostomy status ([place])   1. Acute on chronic respiratory failure with hypoxia continue T collar at this time.  Patient is at her baseline.  Continue supportive measures as well as pulmonary toilet. 2. Acute diastolic heart failure compensated 3. Cardiac arrest rhythm stable 4. ATN improving continue supportive care 5. Tracheostomy remains in place   I have personally seen and evaluated the patient, evaluated laboratory and imaging results, formulated the assessment and plan and placed orders. The Patient requires high complexity decision making for assessment and support.  Case was discussed on Rounds with the Respiratory Therapy Staff  Allyne Gee, MD [facility] Pulmonary Critical Care Medicine Sleep Medicine

## 2018-11-13 ENCOUNTER — Other Ambulatory Visit (HOSPITAL_COMMUNITY): Payer: Medicare Other

## 2018-11-13 ENCOUNTER — Encounter (HOSPITAL_COMMUNITY): Payer: Self-pay | Admitting: Interventional Radiology

## 2018-11-13 HISTORY — PX: IR GASTROSTOMY TUBE MOD SED: IMG625

## 2018-11-13 LAB — CBC
HCT: 41.8 % (ref 36.0–46.0)
Hemoglobin: 14.2 g/dL (ref 12.0–15.0)
MCH: 32.1 pg (ref 26.0–34.0)
MCHC: 34 g/dL (ref 30.0–36.0)
MCV: 94.4 fL (ref 80.0–100.0)
PLATELETS: 257 10*3/uL (ref 150–400)
RBC: 4.43 MIL/uL (ref 3.87–5.11)
RDW: 12.4 % (ref 11.5–15.5)
WBC: 8.4 10*3/uL (ref 4.0–10.5)
nRBC: 0 % (ref 0.0–0.2)

## 2018-11-13 LAB — PROTIME-INR
INR: 1.1 (ref 0.8–1.2)
Prothrombin Time: 13.8 seconds (ref 11.4–15.2)

## 2018-11-13 MED ORDER — MIDAZOLAM HCL 2 MG/2ML IJ SOLN
INTRAMUSCULAR | Status: AC
  Filled 2018-11-13: qty 2

## 2018-11-13 MED ORDER — FENTANYL CITRATE (PF) 100 MCG/2ML IJ SOLN
INTRAMUSCULAR | Status: AC | PRN
  Administered 2018-11-13 (×2): 50 ug via INTRAVENOUS

## 2018-11-13 MED ORDER — MIDAZOLAM HCL 2 MG/2ML IJ SOLN
INTRAMUSCULAR | Status: AC | PRN
  Administered 2018-11-13 (×2): 1 mg via INTRAVENOUS

## 2018-11-13 MED ORDER — FENTANYL CITRATE (PF) 100 MCG/2ML IJ SOLN
INTRAMUSCULAR | Status: AC
  Filled 2018-11-13: qty 2

## 2018-11-13 MED ORDER — LIDOCAINE HCL 1 % IJ SOLN
INTRAMUSCULAR | Status: AC | PRN
  Administered 2018-11-13: 5 mL

## 2018-11-13 MED ORDER — VANCOMYCIN HCL IN DEXTROSE 1-5 GM/200ML-% IV SOLN
INTRAVENOUS | Status: AC
  Filled 2018-11-13: qty 200

## 2018-11-13 MED ORDER — VANCOMYCIN HCL 500 MG IV SOLR
INTRAVENOUS | Status: AC | PRN
  Administered 2018-11-13: 1000 mg via INTRAVENOUS

## 2018-11-13 MED ORDER — IOPAMIDOL (ISOVUE-300) INJECTION 61%
INTRAVENOUS | Status: AC
  Administered 2018-11-13: 20 mL
  Filled 2018-11-13: qty 50

## 2018-11-13 MED ORDER — LIDOCAINE HCL 1 % IJ SOLN
INTRAMUSCULAR | Status: AC
  Filled 2018-11-13: qty 20

## 2018-11-13 NOTE — Procedures (Signed)
Interventional Radiology Procedure Note  Procedure: Placement of percutaneous 20F pull-through gastrostomy tube. Complications: None Recommendations: - NPO except for sips and chips remainder of today and overnight - Maintain G-tube to LWS until tomorrow morning  - May advance diet as tolerated and begin using tube tomorrow morning  Signed,   Kimarie Coor S. Brittanya Winburn, DO   

## 2018-11-13 NOTE — Progress Notes (Signed)
Pulmonary Critical Care Medicine [place]   PULMONARY CRITICAL CARE SERVICE  PROGRESS NOTE  Date of Service: 11/13/2018  Theresa [name]  IWP:[id number]  DOB: 12-27-1965   DOA: 10/19/2018  Referring Physician: Merton Border, MD  HPI: Theresa [name] is a 53 y.o. female seen for follow up of Acute on Chronic Respiratory Failure.  Patient currently is on T collar has a #4 trach in place she is at her baseline.  Doing fairly well  Medications: Reviewed on Rounds  Physical Exam:  Vitals: Temperature 97.7 pulse 72 respiratory 18 blood pressure 121/45 saturation is 94%  Ventilator Settings on T collar off the ventilator  . General: Comfortable at this time . Eyes: Grossly normal lids, irises & conjunctiva . ENT: grossly tongue is normal . Neck: no obvious mass . Cardiovascular: S1 S2 normal no gallop . Respiratory: No rhonchi or rales are noted at this time . Abdomen: soft . Skin: no rash seen on limited exam . Musculoskeletal: not rigid . Psychiatric:unable to assess . Neurologic: no seizure no involuntary movements         Lab Data:   Basic Metabolic Panel: Recent Labs  Lab 11/07/18 0510 11/08/18 0536 11/10/18 0511  NA 134* 134* 132*  K 5.2* 4.7 4.4  CL 94* 93* 93*  CO2 27 30 28   GLUCOSE 160* 196* 177*  BUN 66* 63* 43*  CREATININE 1.31* 1.03* 0.92  CALCIUM 9.4 9.6 9.6  MG 2.5* 2.5* 2.2  PHOS 4.7* 3.5 4.0    ABG: No results for input(s): PHART, PCO2ART, PO2ART, HCO3, O2SAT in the last 168 hours.  Liver Function Tests: Recent Labs  Lab 11/07/18 0510 11/08/18 0536 11/10/18 0511  ALBUMIN 3.1* 3.0* 3.1*   No results for input(s): LIPASE, AMYLASE in the last 168 hours. No results for input(s): AMMONIA in the last 168 hours.  CBC: Recent Labs  Lab 11/07/18 0510 11/10/18 0511 11/13/18 0553  WBC 10.3 10.8* 8.4  HGB 15.3* 14.8 14.2  HCT 46.7* 44.6 41.8  MCV 97.5 95.5 94.4  PLT 294 266 257    Cardiac Enzymes: No results for  input(s): CKTOTAL, CKMB, CKMBINDEX, TROPONINI in the last 168 hours.  BNP (last 3 results) Recent Labs    03/22/18 1434 10/14/18 2248  BNP 1,049.0* 396.0*    ProBNP (last 3 results) No results for input(s): PROBNP in the last 8760 hours.  Radiological Exams: Ir Gastrostomy Tube Mod Sed  Result Date: 11/13/2018 INDICATION: 53 year old female with dysphagia EXAM: PERC PLACEMENT GASTROSTOMY MEDICATIONS: 2 g Ancef; Antibiotics were administered within 1 hour of the procedure. ANESTHESIA/SEDATION: Versed 1.0 mg IV; Fentanyl to Hunt mcg IV Moderate Sedation Time:  . 15 minutes The patient was continuously monitored during the procedure by the interventional radiology nurse under my direct supervision. CONTRAST:  10 cc-administered into the gastric lumen. FLUOROSCOPY TIME:  Fluoroscopy Time: 3 minutes 0 seconds COMPLICATIONS: None immediate. PROCEDURE: Informed written consent was obtained from the patient's family after a thorough discussion of the procedural risks, benefits and alternatives. All questions were addressed. Maximal Sterile Barrier Technique was utilized including caps, mask, sterile gowns, sterile gloves, sterile drape, hand hygiene and skin antiseptic. A timeout was performed prior to the initiation of the procedure. The procedure, risks, benefits, and alternatives were explained to the patient. Questions regarding the procedure were encouraged and answered. The patient understands and consents to the procedure. The epigastrium was prepped with Betadine in a sterile fashion, and a sterile drape was applied covering the operative  field. A sterile gown and sterile gloves were used for the procedure. A 5-French orogastric tube is placed under fluoroscopic guidance. Scout imaging of the abdomen confirms barium within the transverse colon. The stomach was distended with gas. Under fluoroscopic guidance, an 18 gauge needle was utilized to puncture the anterior wall of the body of the stomach. An  Amplatz wire was advanced through the needle passing a T fastener into the lumen of the stomach. The T fastener was secured for gastropexy. A 9-French sheath was inserted. A snare was advanced through the 9-French sheath. A Britta Mccreedy was advanced through the orogastric tube. It was snared then pulled out the oral cavity, pulling the snare, as well. The leading edge of the gastrostomy was attached to the snare. It was then pulled down the esophagus and out the percutaneous site. It was secured in place. Contrast was injected. No complication IMPRESSION: Status post fluoroscopic placed percutaneous gastrostomy tube, with 20 [place] pull-through. Signed, Dulcy Fanny. Earleen Newport, DO [facility] Radiology Electronically Signed   By: Corrie Mckusick D.O.   On: 11/13/2018 17:23    Assessment/Plan Active Problems:   Acute on chronic respiratory failure with hypoxia (HCC)   Acute systolic heart failure (HCC)   Cardiac arrest (HCC)   Acute kidney failure with lesion of tubular necrosis ([place])   Tracheostomy status ([place])   1. Acute on chronic respiratory failure with hypoxia we will continue with T collar trials continue secretion management pulmonary toilet. 2. Acute systolic heart failure compensated continue to monitor. 3. Cardiac arrest rhythm is stable at this time will monitor 4. Acute renal failure resolved continue with supportive care 5. Tracheostomy permanent remains in place   I have personally seen and evaluated the patient, evaluated laboratory and imaging results, formulated the assessment and plan and placed orders. The Patient requires high complexity decision making for assessment and support.  Case was discussed on Rounds with the Respiratory Therapy Staff  Allyne Gee, MD [facility] Pulmonary Critical Care Medicine Sleep Medicine

## 2018-11-14 LAB — BASIC METABOLIC PANEL
ANION GAP: 10 (ref 5–15)
BUN: 28 mg/dL — ABNORMAL HIGH (ref 6–20)
CALCIUM: 9.2 mg/dL (ref 8.9–10.3)
CO2: 27 mmol/L (ref 22–32)
CREATININE: 0.71 mg/dL (ref 0.44–1.00)
Chloride: 98 mmol/L (ref 98–111)
GFR calc non Af Amer: >60 mL/min (ref >60)
Glucose, Bld: 105 mg/dL — ABNORMAL HIGH (ref 70–99)
Potassium: 3.5 mmol/L (ref 3.5–5.1)
Sodium: 135 mmol/L (ref 135–145)

## 2018-11-14 LAB — PROTIME-INR
INR: 1.2 (ref 0.8–1.2)
Prothrombin Time: 15 seconds (ref 11.4–15.2)

## 2018-11-14 NOTE — Progress Notes (Addendum)
Pulmonary Critical Care Medicine [place]   PULMONARY CRITICAL CARE SERVICE  PROGRESS NOTE  Date of Service: 11/14/2018  SAVANNHA [name]  ELF:[id number]  DOB: March 20, 1966   DOA: 10/19/2018  Referring Physician: Merton Border, MD  HPI: Theresa [name] is a 53 y.o. female seen for follow up of Acute on Chronic Respiratory Failure.  Patient continues to do well on aerosol trach collar 30% FiO2.  Using PMV with no difficulty.  Medications: Reviewed on Rounds  Physical Exam:  Vitals: Pulse 69 respirations 18 BP 140/69 O2 sat 93% temp 98.0  Ventilator Settings not currently on ventilator  . General: Comfortable at this time . Eyes: Grossly normal lids, irises & conjunctiva . ENT: grossly tongue is normal . Neck: no obvious mass . Cardiovascular: S1 S2 normal no gallop . Respiratory: No rales or rhonchi noted . Abdomen: soft . Skin: no rash seen on limited exam . Musculoskeletal: not rigid . Psychiatric:unable to assess . Neurologic: no seizure no involuntary movements         Lab Data:   Basic Metabolic Panel: Recent Labs  Lab 11/08/18 0536 11/10/18 0511 11/14/18 0531  NA 134* 132* 135  K 4.7 4.4 3.5  CL 93* 93* 98  CO2 30 28 27   GLUCOSE 196* 177* 105*  BUN 63* 43* 28*  CREATININE 1.03* 0.92 0.71  CALCIUM 9.6 9.6 9.2  MG 2.5* 2.2  --   PHOS 3.5 4.0  --     ABG: No results for input(s): PHART, PCO2ART, PO2ART, HCO3, O2SAT in the last 168 hours.  Liver Function Tests: Recent Labs  Lab 11/08/18 0536 11/10/18 0511  ALBUMIN 3.0* 3.1*   No results for input(s): LIPASE, AMYLASE in the last 168 hours. No results for input(s): AMMONIA in the last 168 hours.  CBC: Recent Labs  Lab 11/10/18 0511 11/13/18 0553  WBC 10.8* 8.4  HGB 14.8 14.2  HCT 44.6 41.8  MCV 95.5 94.4  PLT 266 257    Cardiac Enzymes: No results for input(s): CKTOTAL, CKMB, CKMBINDEX, TROPONINI in the last 168 hours.  BNP (last 3 results) Recent Labs     03/22/18 1434 10/14/18 2248  BNP 1,049.0* 396.0*    ProBNP (last 3 results) No results for input(s): PROBNP in the last 8760 hours.  Radiological Exams: Ir Gastrostomy Tube Mod Sed  Result Date: 11/13/2018 INDICATION: 53 year old female with dysphagia EXAM: PERC PLACEMENT GASTROSTOMY MEDICATIONS: 2 g Ancef; Antibiotics were administered within 1 hour of the procedure. ANESTHESIA/SEDATION: Versed 1.0 mg IV; Fentanyl to Hunt mcg IV Moderate Sedation Time:  . 15 minutes The patient was continuously monitored during the procedure by the interventional radiology nurse under my direct supervision. CONTRAST:  10 cc-administered into the gastric lumen. FLUOROSCOPY TIME:  Fluoroscopy Time: 3 minutes 0 seconds COMPLICATIONS: None immediate. PROCEDURE: Informed written consent was obtained from the patient's family after a thorough discussion of the procedural risks, benefits and alternatives. All questions were addressed. Maximal Sterile Barrier Technique was utilized including caps, mask, sterile gowns, sterile gloves, sterile drape, hand hygiene and skin antiseptic. A timeout was performed prior to the initiation of the procedure. The procedure, risks, benefits, and alternatives were explained to the patient. Questions regarding the procedure were encouraged and answered. The patient understands and consents to the procedure. The epigastrium was prepped with Betadine in a sterile fashion, and a sterile drape was applied covering the operative field. A sterile gown and sterile gloves were used for the procedure. A 5-French orogastric tube is  placed under fluoroscopic guidance. Scout imaging of the abdomen confirms barium within the transverse colon. The stomach was distended with gas. Under fluoroscopic guidance, an 18 gauge needle was utilized to puncture the anterior wall of the body of the stomach. An Amplatz wire was advanced through the needle passing a T fastener into the lumen of the stomach. The T fastener  was secured for gastropexy. A 9-French sheath was inserted. A snare was advanced through the 9-French sheath. A Britta Mccreedy was advanced through the orogastric tube. It was snared then pulled out the oral cavity, pulling the snare, as well. The leading edge of the gastrostomy was attached to the snare. It was then pulled down the esophagus and out the percutaneous site. It was secured in place. Contrast was injected. No complication IMPRESSION: Status post fluoroscopic placed percutaneous gastrostomy tube, with 20 [place] pull-through. Signed, Dulcy Fanny. Earleen Newport, DO [facility] Radiology Electronically Signed   By: Corrie Mckusick D.O.   On: 11/13/2018 17:23    Assessment/Plan Active Problems:   Acute on chronic respiratory failure with hypoxia (HCC)   Acute systolic heart failure (HCC)   Cardiac arrest (HCC)   Acute kidney failure with lesion of tubular necrosis ([place])   Tracheostomy status ([place])   1. Acute on chronic respiratory failure with hypoxia continue with T collar trials as well as secretion management and pulmonary toilet. 2. Acute systolic heart failure compensated continue to monitor 3. Cardiac arrest rhythm stable at this time will monitor 4. Acute renal failure resolved continue with supportive care 5. Tracheostomy permanent trach remains in place   I have personally seen and evaluated the patient, evaluated laboratory and imaging results, formulated the assessment and plan and placed orders. The Patient requires high complexity decision making for assessment and support.  Case was discussed on Rounds with the Respiratory Therapy Staff  Allyne Gee, MD [facility] Pulmonary Critical Care Medicine Sleep Medicine

## 2018-11-14 NOTE — Progress Notes (Signed)
Referring Physician(s): Robb Matar, NP  Supervising Physician: Jacqulynn Cadet  Patient Status:  [facility]  Chief Complaint: Follow up gastrostomy tube placed 3/9.  Subjective:  Patient sitting up in recliner watching TV, states that her stomach is a little sore today but it's not too bad. She is concerned about showering and how she will take care of this in the future.  Allergies: Penicillins; Ciprofloxacin; and Ibuprofen  Medications: Prior to Admission medications   Medication Sig Start Date End Date Taking? Authorizing Provider  albuterol (PROVENTIL) (2.5 MG/3ML) 0.083% nebulizer solution Take 3 mLs (2.5 mg total) by nebulization 2 (two) times daily. 10/19/18   Erick Colace, NP  Chlorhexidine Gluconate Cloth 2 % PADS Apply 6 each topically daily at 6 (six) AM. 10/20/18   Erick Colace, NP  Dextrose-Sodium Chloride (DEXTROSE 5 % AND 0.45% NACL) infusion KVO 10/19/18   Erick Colace, NP  docusate (COLACE) 50 MG/5ML liquid Place 10 mLs (100 mg total) into feeding tube 2 (two) times daily as needed for mild constipation. 10/19/18   Erick Colace, NP  famotidine (PEPCID) 20-0.9 MG/50ML-% Inject 50 mLs (20 mg total) into the vein every 12 (twelve) hours. 10/19/18   Erick Colace, NP  fentaNYL (SUBLIMAZE) 100 MCG/2ML injection Inject 1-4 mLs (50-200 mcg total) into the vein every 2 (two) hours as needed for severe pain. 10/19/18   Erick Colace, NP  furosemide (LASIX) 10 MG/ML injection Inject 8 mLs (80 mg total) into the vein every 8 (eight) hours. 10/19/18   Erick Colace, NP  gabapentin (NEURONTIN) 250 MG/5ML solution Place 6 mLs (300 mg total) into feeding tube daily. 10/19/18   Erick Colace, NP  heparin 25000-0.45 UT/250ML-% infusion Inject 1,250 Units/hr into the vein continuous. 10/19/18   Erick Colace, NP  insulin aspart (NOVOLOG) 100 UNIT/ML injection Inject 0-9 Units into the skin every 4 (four) hours. 10/19/18   Erick Colace, NP    insulin aspart (NOVOLOG) 100 UNIT/ML injection Inject 2 Units into the skin every 4 (four) hours. 10/19/18   Erick Colace, NP  insulin glargine (LANTUS) 100 UNIT/ML injection Inject 0.18 mLs (18 Units total) into the skin daily. 10/19/18   Erick Colace, NP  nitroGLYCERIN (NITROSTAT) 0.4 MG SL tablet Place 0.4 mg under the tongue every 5 (five) minutes as needed for chest pain.    [provider]  Nutritional Supplements (FEEDING SUPPLEMENT, VITAL HIGH PROTEIN,) LIQD liquid Place 1,000 mLs into feeding tube daily. 10/20/18   Erick Colace, NP  polyethylene glycol (MIRALAX / GLYCOLAX) packet Take 17 g by mouth daily. 10/20/18   Erick Colace, NP  sennosides (SENOKOT) 8.8 MG/5ML syrup Place 5 mLs into feeding tube daily. 10/20/18   Erick Colace, NP  warfarin (COUMADIN) 5 MG tablet Take 1 tablet (5 mg total) by mouth one time only at 6 PM. 10/19/18   Erick Colace, NP     Vital Signs: BP 94/62 (BP Location: Left Arm)   Pulse 84   Resp 13   SpO2 98%   Physical Exam Vitals signs reviewed.  Constitutional:      Appearance: She is obese.  HENT:     Head: Normocephalic.  Cardiovascular:     Rate and Rhythm: Normal rate.  Pulmonary:     Effort: Pulmonary effort is normal.     Comments: (+) tracheostomy Abdominal:     General: There is no distension.  Palpations: Abdomen is soft.     Tenderness: There is abdominal tenderness (mild at insertion site).     Comments: (+) g-tube. Clean, dry, dressed appropriately, dried blood around insertion site. No leaking or active bleeding.  Skin:    General: Skin is warm and dry.  Neurological:     Mental Status: She is alert.     Imaging: Ir Gastrostomy Tube Mod Sed  Result Date: 11/13/2018 INDICATION: 53 year old female with dysphagia EXAM: PERC PLACEMENT GASTROSTOMY MEDICATIONS: 2 g Ancef; Antibiotics were administered within 1 hour of the procedure. ANESTHESIA/SEDATION: Versed 1.0 mg IV; Fentanyl to [place] mcg IV  Moderate Sedation Time:  . 15 minutes The patient was continuously monitored during the procedure by the interventional radiology nurse under my direct supervision. CONTRAST:  10 cc-administered into the gastric lumen. FLUOROSCOPY TIME:  Fluoroscopy Time: 3 minutes 0 seconds COMPLICATIONS: None immediate. PROCEDURE: Informed written consent was obtained from the patient's family after a thorough discussion of the procedural risks, benefits and alternatives. All questions were addressed. Maximal Sterile Barrier Technique was utilized including caps, mask, sterile gowns, sterile gloves, sterile drape, hand hygiene and skin antiseptic. A timeout was performed prior to the initiation of the procedure. The procedure, risks, benefits, and alternatives were explained to the patient. Questions regarding the procedure were encouraged and answered. The patient understands and consents to the procedure. The epigastrium was prepped with Betadine in a sterile fashion, and a sterile drape was applied covering the operative field. A sterile gown and sterile gloves were used for the procedure. A 5-French orogastric tube is placed under fluoroscopic guidance. Scout imaging of the abdomen confirms barium within the transverse colon. The stomach was distended with gas. Under fluoroscopic guidance, an 18 gauge needle was utilized to puncture the anterior wall of the body of the stomach. An Amplatz wire was advanced through the needle passing a T fastener into the lumen of the stomach. The T fastener was secured for gastropexy. A 9-French sheath was inserted. A snare was advanced through the 9-French sheath. A Britta Mccreedy was advanced through the orogastric tube. It was snared then pulled out the oral cavity, pulling the snare, as well. The leading edge of the gastrostomy was attached to the snare. It was then pulled down the esophagus and out the percutaneous site. It was secured in place. Contrast was injected. No complication IMPRESSION:  Status post fluoroscopic placed percutaneous gastrostomy tube, with 20 [place] pull-through. Signed, Dulcy Fanny. Earleen Newport, DO [facility] Radiology Electronically Signed   By: Corrie Mckusick D.O.   On: 11/13/2018 17:23    Labs:  CBC: Recent Labs    11/05/18 0450 11/07/18 0510 11/10/18 0511 11/13/18 0553  WBC 10.1 10.3 10.8* 8.4  HGB 15.6* 15.3* 14.8 14.2  HCT 47.5* 46.7* 44.6 41.8  PLT 380 294 266 257    COAGS: Recent Labs    10/24/18 0541  11/09/18 0613 11/10/18 0511 11/13/18 0553 11/14/18 1234  INR  --    < > 2.2* 2.1* 1.1 1.2  APTT 79*  --   --   --   --   --    < > = values in this interval not displayed.    BMP: Recent Labs    11/07/18 0510 11/08/18 0536 11/10/18 0511 11/14/18 0531  NA 134* 134* 132* 135  K 5.2* 4.7 4.4 3.5  CL 94* 93* 93* 98  CO2 27 30 28 27   GLUCOSE 160* 196* 177* 105*  BUN 66* 63* 43*  28*  CALCIUM 9.4 9.6 9.6 9.2  CREATININE 1.31* 1.03* 0.92 0.71  GFRNONAA 46* >60 >60 >60  GFRAA 54* >60 >60 >60    LIVER FUNCTION TESTS: Recent Labs    10/14/18 2247 11/03/18 0603 11/07/18 0510 11/08/18 0536 11/10/18 0511  BILITOT 0.8  --   --   --   --   AST 38  --   --   --   --   ALT 27  --   --   --   --   ALKPHOS 133*  --   --   --   --   PROT 8.1  --   --   --   --   ALBUMIN 3.5 3.1* 3.1* 3.0* 3.1*    Assessment and Plan:  53 y/o F s/p gastrostomy tube placement 3/9 in IR seen for site check today. Minimal soreness at insertion site which is to be expected, insertion site unremarkable. Tube has not been used yet per patient.   Ok to use tube for feeds/meds.  Please call IR questions or concerns.   Electronically Signed: Joaquim Nam, PA-C 11/14/2018, 2:21 PM   I spent a total of 15 Minutes at the the patient's bedside AND on the patient's hospital floor or unit, greater than 50% of which was counseling/coordinating care for g-tube follow up.

## 2018-11-15 LAB — PROTIME-INR
INR: 1.2 (ref 0.8–1.2)
Prothrombin Time: 15.1 seconds (ref 11.4–15.2)

## 2018-11-15 NOTE — Progress Notes (Addendum)
Pulmonary Critical Care Medicine [place]   PULMONARY CRITICAL CARE SERVICE  PROGRESS NOTE  Date of Service: 11/15/2018  Theresa [name]  [id number]  DOB: [birth year]   DOA: 10/19/2018  Referring Physician: Merton Border, MD  HPI: Theresa [name] is a 53 y.o. female seen for follow up of Acute on Chronic Respiratory Failure.  Patient remains on aerosol trach collar 30% FiO2.  Has a PMV and is doing well at this time.  Medications: Reviewed on Rounds  Physical Exam:  Vitals: Pulse 70 respirations 18 BP 123/64 O2 sat 96% temp 97.6  Ventilator Settings   . General: Comfortable at this time . Eyes: Grossly normal lids, irises & conjunctiva . ENT: grossly tongue is normal . Neck: no obvious mass . Cardiovascular: S1 S2 normal no gallop . Respiratory: Patient's not currently on ventilator . Abdomen: soft . Skin: no rash seen on limited exam . Musculoskeletal: not rigid . Psychiatric:unable to assess . Neurologic: no seizure no involuntary movements         Lab Data:   Basic Metabolic Panel: Recent Labs  Lab 11/10/18 0511 11/14/18 0531  NA 132* 135  K 4.4 3.5  CL 93* 98  CO2 28 27  GLUCOSE 177* 105*  BUN 43* 28*  CREATININE 0.92 0.71  CALCIUM 9.6 9.2  MG 2.2  --   PHOS 4.0  --     ABG: No results for input(s): PHART, PCO2ART, PO2ART, HCO3, O2SAT in the last 168 hours.  Liver Function Tests: Recent Labs  Lab 11/10/18 0511  ALBUMIN 3.1*   No results for input(s): LIPASE, AMYLASE in the last 168 hours. No results for input(s): AMMONIA in the last 168 hours.  CBC: Recent Labs  Lab 11/10/18 0511 11/13/18 0553  WBC 10.8* 8.4  HGB 14.8 14.2  HCT 44.6 41.8  MCV 95.5 94.4  PLT 266 257    Cardiac Enzymes: No results for input(s): CKTOTAL, CKMB, CKMBINDEX, TROPONINI in the last 168 hours.  BNP (last 3 results) Recent Labs    03/22/18 1434 10/14/18 2248  BNP 1,049.0* 396.0*    ProBNP (last 3 results) No results for  input(s): PROBNP in the last 8760 hours.  Radiological Exams: No results found.  Assessment/Plan Active Problems:   Acute on chronic respiratory failure with hypoxia (HCC)   Acute systolic heart failure (HCC)   Cardiac arrest (HCC)   Acute kidney failure with lesion of tubular necrosis ([place])   Tracheostomy status ([place])   1. Acute on chronic respiratory failure with hypoxia continue with T collar trials well secretion management and pulmonary toilet 2. Acute systolic heart failure compensated continue to monitor 3. Cardiac arrest rhythm stable at this time continue to monitor 4. Acute renal failure resolved continue supportive care 5. Tracheostomy permanent trach remains in place   I have personally seen and evaluated the patient, evaluated laboratory and imaging results, formulated the assessment and plan and placed orders. The Patient requires high complexity decision making for assessment and support.  Case was discussed on Rounds with the Respiratory Therapy Staff  Allyne Gee, MD [facility] Pulmonary Critical Care Medicine Sleep Medicine

## 2018-11-16 ENCOUNTER — Other Ambulatory Visit: Payer: Self-pay

## 2018-11-16 ENCOUNTER — Other Ambulatory Visit (HOSPITAL_COMMUNITY): Payer: Medicare Other

## 2018-11-16 LAB — PROTIME-INR
INR: 1.2 (ref 0.8–1.2)
Prothrombin Time: 15.3 seconds — ABNORMAL HIGH (ref 11.4–15.2)

## 2018-11-16 NOTE — Progress Notes (Signed)
Pulmonary Critical Care Medicine [place]   PULMONARY CRITICAL CARE SERVICE  PROGRESS NOTE  Date of Service: 11/16/2018  Theresa [name]  [id number]  DOB: [birth year]   DOA: 10/19/2018  Referring Physician: Merton Border, MD  HPI: Theresa [name] is a 53 y.o. female seen for follow up of Acute on Chronic Respiratory Failure.  Patient is doing well walking in the hallway with physical therapy is on T collar requiring about 30% FiO2.  Medications: Reviewed on Rounds  Physical Exam:  Vitals: Temperature 96.8 pulse 70 respiratory 20 blood pressure 114/54 saturations 98%  Ventilator Settings off the ventilator on T collar with a #4 trach in place  . General: Comfortable at this time . Eyes: Grossly normal lids, irises & conjunctiva . ENT: grossly tongue is normal . Neck: no obvious mass . Cardiovascular: S1 S2 normal no gallop . Respiratory: No rhonchi or rales are noted at this time . Abdomen: soft . Skin: no rash seen on limited exam . Musculoskeletal: not rigid . Psychiatric:unable to assess . Neurologic: no seizure no involuntary movements         Lab Data:   Basic Metabolic Panel: Recent Labs  Lab 11/10/18 0511 11/14/18 0531  NA 132* 135  K 4.4 3.5  CL 93* 98  CO2 28 27  GLUCOSE 177* 105*  BUN 43* 28*  CREATININE 0.92 0.71  CALCIUM 9.6 9.2  MG 2.2  --   PHOS 4.0  --     ABG: No results for input(s): PHART, PCO2ART, PO2ART, HCO3, O2SAT in the last 168 hours.  Liver Function Tests: Recent Labs  Lab 11/10/18 0511  ALBUMIN 3.1*   No results for input(s): LIPASE, AMYLASE in the last 168 hours. No results for input(s): AMMONIA in the last 168 hours.  CBC: Recent Labs  Lab 11/10/18 0511 11/13/18 0553  WBC 10.8* 8.4  HGB 14.8 14.2  HCT 44.6 41.8  MCV 95.5 94.4  PLT 266 257    Cardiac Enzymes: No results for input(s): CKTOTAL, CKMB, CKMBINDEX, TROPONINI in the last 168 hours.  BNP (last 3 results) Recent Labs     03/22/18 1434 10/14/18 2248  BNP 1,049.0* 396.0*    ProBNP (last 3 results) No results for input(s): PROBNP in the last 8760 hours.  Radiological Exams: No results found.  Assessment/Plan Active Problems:   Acute on chronic respiratory failure with hypoxia (HCC)   Acute systolic heart failure (HCC)   Cardiac arrest (HCC)   Acute kidney failure with lesion of tubular necrosis ([place])   Tracheostomy status ([place])   1. Acute on chronic respiratory failure with hypoxia we will continue with T collar titrate oxygen down as tolerated 2. Acute systolic heart failure compensated continue present management 3. Cardiac arrest rhythm is stable 4. ATN treated we will continue to follow 5. Tracheostomy remains in place   I have personally seen and evaluated the patient, evaluated laboratory and imaging results, formulated the assessment and plan and placed orders. The Patient requires high complexity decision making for assessment and support.  Case was discussed on Rounds with the Respiratory Therapy Staff  Allyne Gee, MD [facility] Pulmonary Critical Care Medicine Sleep Medicine

## 2018-11-17 LAB — CBC
HEMATOCRIT: 38.4 % (ref 36.0–46.0)
Hemoglobin: 12.6 g/dL (ref 12.0–15.0)
MCH: 31.3 pg (ref 26.0–34.0)
MCHC: 32.8 g/dL (ref 30.0–36.0)
MCV: 95.5 fL (ref 80.0–100.0)
Platelets: 274 10*3/uL (ref 150–400)
RBC: 4.02 MIL/uL (ref 3.87–5.11)
RDW: 12.8 % (ref 11.5–15.5)
WBC: 12.2 10*3/uL — ABNORMAL HIGH (ref 4.0–10.5)
nRBC: 0 % (ref 0.0–0.2)

## 2018-11-17 LAB — BASIC METABOLIC PANEL
Anion gap: 13 (ref 5–15)
BUN: 27 mg/dL — AB (ref 6–20)
CALCIUM: 9.1 mg/dL (ref 8.9–10.3)
CO2: 29 mmol/L (ref 22–32)
Chloride: 96 mmol/L — ABNORMAL LOW (ref 98–111)
Creatinine, Ser: 0.66 mg/dL (ref 0.44–1.00)
GFR calc Af Amer: >60 mL/min (ref >60)
GFR calc non Af Amer: >60 mL/min (ref >60)
Glucose, Bld: 111 mg/dL — ABNORMAL HIGH (ref 70–99)
Potassium: 3.5 mmol/L (ref 3.5–5.1)
Sodium: 138 mmol/L (ref 135–145)

## 2018-11-17 LAB — PROTIME-INR
INR: 1.6 — ABNORMAL HIGH (ref 0.8–1.2)
Prothrombin Time: 18.7 seconds — ABNORMAL HIGH (ref 11.4–15.2)

## 2018-11-17 LAB — MAGNESIUM: Magnesium: 1.8 mg/dL (ref 1.7–2.4)

## 2018-11-17 NOTE — Progress Notes (Signed)
Pulmonary Critical Care Medicine [place]   PULMONARY CRITICAL CARE SERVICE  PROGRESS NOTE  Date of Service: 11/17/2018  Theresa [name]  [id number]  DOB: 07/24/1966   DOA: 10/19/2018  Referring Physician: Merton Border, MD  HPI: Theresa [name] is a 53 y.o. female seen for follow up of Acute on Chronic Respiratory Failure.  Patient is on T collar she is at her baseline.  Remains on 30% FiO2  Medications: Reviewed on Rounds  Physical Exam:  Vitals: Temperature 97.6 pulse 72 respiratory 12 blood pressure 135/75 saturation 97%  Ventilator Settings off the ventilator right now on T collar  . General: Comfortable at this time . Eyes: Grossly normal lids, irises & conjunctiva . ENT: grossly tongue is normal . Neck: no obvious mass . Cardiovascular: S1 S2 normal no gallop . Respiratory: No rhonchi or rales are noted at this time . Abdomen: soft . Skin: no rash seen on limited exam . Musculoskeletal: not rigid . Psychiatric:unable to assess . Neurologic: no seizure no involuntary movements         Lab Data:   Basic Metabolic Panel: Recent Labs  Lab 11/14/18 0531 11/17/18 0633  NA 135 138  K 3.5 3.5  CL 98 96*  CO2 27 29  GLUCOSE 105* 111*  BUN 28* 27*  CREATININE 0.71 0.66  CALCIUM 9.2 9.1  MG  --  1.8    ABG: No results for input(s): PHART, PCO2ART, PO2ART, HCO3, O2SAT in the last 168 hours.  Liver Function Tests: No results for input(s): AST, ALT, ALKPHOS, BILITOT, PROT, ALBUMIN in the last 168 hours. No results for input(s): LIPASE, AMYLASE in the last 168 hours. No results for input(s): AMMONIA in the last 168 hours.  CBC: Recent Labs  Lab 11/13/18 0553 11/17/18 0633  WBC 8.4 12.2*  HGB 14.2 12.6  HCT 41.8 38.4  MCV 94.4 95.5  PLT 257 274    Cardiac Enzymes: No results for input(s): CKTOTAL, CKMB, CKMBINDEX, TROPONINI in the last 168 hours.  BNP (last 3 results) Recent Labs    03/22/18 1434 10/14/18 2248  BNP  1,049.0* 396.0*    ProBNP (last 3 results) No results for input(s): PROBNP in the last 8760 hours.  Radiological Exams: No results found.  Assessment/Plan Active Problems:   Acute on chronic respiratory failure with hypoxia (HCC)   Acute systolic heart failure (HCC)   Cardiac arrest (HCC)   Acute kidney failure with lesion of tubular necrosis ([place])   Tracheostomy status ([place])   1. Acute on chronic respiratory failure hypoxia continue with T collar as ordered continue secretion management pulmonary toilet 2. Acute systolic heart failure at baseline 3. Cardiac arrest rhythm stable 4. Acute renal failure with ATN resolved 5. Tracheostomy will remain in place   I have personally seen and evaluated the patient, evaluated laboratory and imaging results, formulated the assessment and plan and placed orders. The Patient requires high complexity decision making for assessment and support.  Case was discussed on Rounds with the Respiratory Therapy Staff  Allyne Gee, MD [facility] Pulmonary Critical Care Medicine Sleep Medicine

## 2018-11-18 LAB — PROTIME-INR
INR: 1.8 — ABNORMAL HIGH (ref 0.8–1.2)
Prothrombin Time: 20.7 seconds — ABNORMAL HIGH (ref 11.4–15.2)

## 2018-11-18 NOTE — Progress Notes (Addendum)
Pulmonary Critical Care Medicine [place]   PULMONARY CRITICAL CARE SERVICE  PROGRESS NOTE  Date of Service: 11/18/2018  Theresa [name]  [id number]  DOB: [birth year]   DOA: 10/19/2018  Referring Physician: Merton Border, MD  HPI: Theresa [name] is a 53 y.o. female seen for follow up of Acute on Chronic Respiratory Failure.  Patient remains at her baseline on T collar with an FiO2 of 30%.  Patient's PMV is in place and working well at this time.  Medications: Reviewed on Rounds  Physical Exam:  Vitals: Pulse 68 respirations 20 BP 107/56 O2 sat 96% temp 97.3  Ventilator Settings not currently on ventilator  . General: Comfortable at this time . Eyes: Grossly normal lids, irises & conjunctiva . ENT: grossly tongue is normal . Neck: no obvious mass . Cardiovascular: S1 S2 normal no gallop . Respiratory: No rales or rhonchi noted . Abdomen: soft . Skin: no rash seen on limited exam . Musculoskeletal: not rigid . Psychiatric:unable to assess . Neurologic: no seizure no involuntary movements         Lab Data:   Basic Metabolic Panel: Recent Labs  Lab 11/14/18 0531 11/17/18 0633  NA 135 138  K 3.5 3.5  CL 98 96*  CO2 27 29  GLUCOSE 105* 111*  BUN 28* 27*  CREATININE 0.71 0.66  CALCIUM 9.2 9.1  MG  --  1.8    ABG: No results for input(s): PHART, PCO2ART, PO2ART, HCO3, O2SAT in the last 168 hours.  Liver Function Tests: No results for input(s): AST, ALT, ALKPHOS, BILITOT, PROT, ALBUMIN in the last 168 hours. No results for input(s): LIPASE, AMYLASE in the last 168 hours. No results for input(s): AMMONIA in the last 168 hours.  CBC: Recent Labs  Lab 11/13/18 0553 11/17/18 0633  WBC 8.4 12.2*  HGB 14.2 12.6  HCT 41.8 38.4  MCV 94.4 95.5  PLT 257 274    Cardiac Enzymes: No results for input(s): CKTOTAL, CKMB, CKMBINDEX, TROPONINI in the last 168 hours.  BNP (last 3 results) Recent Labs    03/22/18 1434 10/14/18 2248  BNP  1,049.0* 396.0*    ProBNP (last 3 results) No results for input(s): PROBNP in the last 8760 hours.  Radiological Exams: No results found.  Assessment/Plan Active Problems:   Acute on chronic respiratory failure with hypoxia (HCC)   Acute systolic heart failure (HCC)   Cardiac arrest (HCC)   Acute kidney failure with lesion of tubular necrosis ([place])   Tracheostomy status ([place])   1. Acute on chronic respiratory failure with hypoxia continue T collar as ordered as well as secretion management pulmonary toilet. 2. Acute systolic heart failure at baseline 3. Cardiac arrest rhythm stable 4. Acute renal failure with ATN resolved 5. Tracheostomy remains in place   I have personally seen and evaluated the patient, evaluated laboratory and imaging results, formulated the assessment and plan and placed orders. The Patient requires high complexity decision making for assessment and support.  Case was discussed on Rounds with the Respiratory Therapy Staff  Allyne Gee, MD [facility] Pulmonary Critical Care Medicine Sleep Medicine

## 2018-11-19 LAB — PROTIME-INR
INR: 3 — AB (ref 0.8–1.2)
Prothrombin Time: 30.3 seconds — ABNORMAL HIGH (ref 11.4–15.2)

## 2018-11-19 NOTE — Progress Notes (Addendum)
Pulmonary Critical Care Medicine Theresa [name]   PULMONARY CRITICAL CARE SERVICE  PROGRESS NOTE  Date of Service: 11/19/2018  TEKIA [place]  [id number]  DOB: Dec 02, 1965   DOA: 10/19/2018  Referring Physician: Merton Border, MD  HPI: Theresa [name] is a 53 y.o. female seen for follow up of Acute on Chronic Respiratory Failure.  Patient continues on T collar 30% FiO2.  This is her baseline.  She is using PMV without difficulty and has good saturations.  Medications: Reviewed on Rounds  Physical Exam:  Vitals: Pulse 96 respirations 18 BP 117/59 O2 sat 9 6% temp 97.4  Ventilator Settings not currently on ventilator  . General: Comfortable at this time . Eyes: Grossly normal lids, irises & conjunctiva . ENT: grossly tongue is normal . Neck: no obvious mass . Cardiovascular: S1 S2 normal no gallop . Respiratory: No rales or rhonchi noted . Abdomen: soft . Skin: no rash seen on limited exam . Musculoskeletal: not rigid . Psychiatric:unable to assess . Neurologic: no seizure no involuntary movements         Lab Data:   Basic Metabolic Panel: Recent Labs  Lab 11/14/18 0531 11/17/18 0633  NA 135 138  K 3.5 3.5  CL 98 96*  CO2 27 29  GLUCOSE 105* 111*  BUN 28* 27*  CREATININE 0.71 0.66  CALCIUM 9.2 9.1  MG  --  1.8    ABG: No results for input(s): PHART, PCO2ART, PO2ART, HCO3, O2SAT in the last 168 hours.  Liver Function Tests: No results for input(s): AST, ALT, ALKPHOS, BILITOT, PROT, ALBUMIN in the last 168 hours. No results for input(s): LIPASE, AMYLASE in the last 168 hours. No results for input(s): AMMONIA in the last 168 hours.  CBC: Recent Labs  Lab 11/13/18 0553 11/17/18 0633  WBC 8.4 12.2*  HGB 14.2 12.6  HCT 41.8 38.4  MCV 94.4 95.5  PLT 257 274    Cardiac Enzymes: No results for input(s): CKTOTAL, CKMB, CKMBINDEX, TROPONINI in the last 168 hours.  BNP (last 3 results) Recent Labs    03/22/18 1434 10/14/18 2248  BNP  1,049.0* 396.0*    ProBNP (last 3 results) No results for input(s): PROBNP in the last 8760 hours.  Radiological Exams: No results found.  Assessment/Plan Active Problems:   Acute on chronic respiratory failure with hypoxia (HCC)   Acute systolic heart failure (HCC)   Cardiac arrest (HCC)   Acute kidney failure with lesion of tubular necrosis ([place])   Tracheostomy status ([place])   1. Acute on chronic respiratory failure with hypoxia continue T collar as ordered.  Continue secretion management and pulmonary toilet. 2. Acute systolic heart baseline 3. Cardiac arrest rhythm stable 4. Acute renal failure with ATN resolved 5. Tracheostomy remains in place   I have personally seen and evaluated the patient, evaluated laboratory and imaging results, formulated the assessment and plan and placed orders. The Patient requires high complexity decision making for assessment and support.  Case was discussed on Rounds with the Respiratory Therapy Staff  Allyne Gee, MD [facility] Pulmonary Critical Care Medicine Sleep Medicine

## 2018-11-20 LAB — CBC
HCT: 37.5 % (ref 36.0–46.0)
Hemoglobin: 12.6 g/dL (ref 12.0–15.0)
MCH: 32.3 pg (ref 26.0–34.0)
MCHC: 33.6 g/dL (ref 30.0–36.0)
MCV: 96.2 fL (ref 80.0–100.0)
Platelets: 278 10*3/uL (ref 150–400)
RBC: 3.9 MIL/uL (ref 3.87–5.11)
RDW: 13.2 % (ref 11.5–15.5)
WBC: 9.3 10*3/uL (ref 4.0–10.5)
nRBC: 0 % (ref 0.0–0.2)

## 2018-11-20 LAB — BASIC METABOLIC PANEL
Anion gap: 6 (ref 5–15)
BUN: 12 mg/dL (ref 6–20)
CO2: 29 mmol/L (ref 22–32)
CREATININE: 0.58 mg/dL (ref 0.44–1.00)
Calcium: 8.7 mg/dL — ABNORMAL LOW (ref 8.9–10.3)
Chloride: 101 mmol/L (ref 98–111)
GFR calc non Af Amer: >60 mL/min (ref >60)
Glucose, Bld: 101 mg/dL — ABNORMAL HIGH (ref 70–99)
Potassium: 3.6 mmol/L (ref 3.5–5.1)
Sodium: 136 mmol/L (ref 135–145)

## 2018-11-20 LAB — MAGNESIUM: Magnesium: 2 mg/dL (ref 1.7–2.4)

## 2018-11-20 LAB — PROTIME-INR
INR: 2.6 — ABNORMAL HIGH (ref 0.8–1.2)
Prothrombin Time: 27.3 seconds — ABNORMAL HIGH (ref 11.4–15.2)

## 2018-11-20 NOTE — Progress Notes (Signed)
Pulmonary Critical Care Medicine [place]   PULMONARY CRITICAL CARE SERVICE  PROGRESS NOTE  Date of Service: 11/20/2018  MAZZIE BRODRICK  RXV:[id number]  DOB: 04-09-1966   DOA: 10/19/2018  Referring Physician: Merton Border, MD  HPI: Theresa [name] is a 53 y.o. female seen for follow up of Acute on Chronic Respiratory Failure.  She is doing well right now is on T collar on 35% FiO2 has been walking in the hallways tolerating the PMV  Medications: Reviewed on Rounds  Physical Exam:  Vitals: Temperature 97.2 pulse 74 respiratory 20 blood pressure 131/45 saturations 94%  Ventilator Settings currently is off the ventilator on 35% FiO2 on T collar  . General: Comfortable at this time . Eyes: Grossly normal lids, irises & conjunctiva . ENT: grossly tongue is normal . Neck: no obvious mass . Cardiovascular: S1 S2 normal no gallop . Respiratory: No rhonchi or rales are noted at this time . Abdomen: soft . Skin: no rash seen on limited exam . Musculoskeletal: not rigid . Psychiatric:unable to assess . Neurologic: no seizure no involuntary movements         Lab Data:   Basic Metabolic Panel: Recent Labs  Lab 11/14/18 0531 11/17/18 0633 11/20/18 0636  NA 135 138 136  K 3.5 3.5 3.6  CL 98 96* 101  CO2 27 29 29   GLUCOSE 105* 111* 101*  BUN 28* 27* 12  CREATININE 0.71 0.66 0.58  CALCIUM 9.2 9.1 8.7*  MG  --  1.8 2.0    ABG: No results for input(s): PHART, PCO2ART, PO2ART, HCO3, O2SAT in the last 168 hours.  Liver Function Tests: No results for input(s): AST, ALT, ALKPHOS, BILITOT, PROT, ALBUMIN in the last 168 hours. No results for input(s): LIPASE, AMYLASE in the last 168 hours. No results for input(s): AMMONIA in the last 168 hours.  CBC: Recent Labs  Lab 11/17/18 0633 11/20/18 0636  WBC 12.2* 9.3  HGB 12.6 12.6  HCT 38.4 37.5  MCV 95.5 96.2  PLT 274 278    Cardiac Enzymes: No results for input(s): CKTOTAL, CKMB, CKMBINDEX, TROPONINI  in the last 168 hours.  BNP (last 3 results) Recent Labs    03/22/18 1434 10/14/18 2248  BNP 1,049.0* 396.0*    ProBNP (last 3 results) No results for input(s): PROBNP in the last 8760 hours.  Radiological Exams: No results found.  Assessment/Plan Active Problems:   Acute on chronic respiratory failure with hypoxia (HCC)   Acute systolic heart failure (HCC)   Cardiac arrest (HCC)   Acute kidney failure with lesion of tubular necrosis ([place])   Tracheostomy status ([place])   1. Acute on chronic respiratory failure with hypoxia we will continue with T collar trials continue secretion management pulmonary toilet 2. Acute systolic heart failure at baseline continue present management 3. Cardiac arrest resolved 4. Cute renal failure resolved 5. Tracheostomy will remain in place   I have personally seen and evaluated the patient, evaluated laboratory and imaging results, formulated the assessment and plan and placed orders. The Patient requires high complexity decision making for assessment and support.  Case was discussed on Rounds with the Respiratory Therapy Staff  Allyne Gee, MD [facility] Pulmonary Critical Care Medicine Sleep Medicine

## 2018-11-21 ENCOUNTER — Other Ambulatory Visit (HOSPITAL_COMMUNITY): Payer: Medicare Other

## 2018-11-21 LAB — PROTIME-INR
INR: 2.8 — ABNORMAL HIGH (ref 0.8–1.2)
Prothrombin Time: 29.3 seconds — ABNORMAL HIGH (ref 11.4–15.2)

## 2018-11-21 NOTE — Progress Notes (Signed)
Pulmonary Critical Care Medicine [place]   PULMONARY CRITICAL CARE SERVICE  PROGRESS NOTE  Date of Service: 11/21/2018  Theresa [name]  [id number]  DOB: [birth year]   DOA: 10/19/2018  Referring Physician: Merton Border, MD  HPI: Theresa [name] is a 53 y.o. female seen for follow up of Acute on Chronic Respiratory Failure.  She is doing well remains on T collar has been working with physical therapy good tolerance  Medications: Reviewed on Rounds  Physical Exam:  Vitals: Temperature 97.4 pulse 73 respiratory rate 18 blood pressure 99/51 saturations 94%  Ventilator Settings currently on T collar 35% FiO2  . General: Comfortable at this time . Eyes: Grossly normal lids, irises & conjunctiva . ENT: grossly tongue is normal . Neck: no obvious mass . Cardiovascular: S1 S2 normal no gallop . Respiratory: No rhonchi or rales are noted at this time . Abdomen: soft . Skin: no rash seen on limited exam . Musculoskeletal: not rigid . Psychiatric:unable to assess . Neurologic: no seizure no involuntary movements         Lab Data:   Basic Metabolic Panel: Recent Labs  Lab 11/17/18 0633 11/20/18 0636  NA 138 136  K 3.5 3.6  CL 96* 101  CO2 29 29  GLUCOSE 111* 101*  BUN 27* 12  CREATININE 0.66 0.58  CALCIUM 9.1 8.7*  MG 1.8 2.0    ABG: No results for input(s): PHART, PCO2ART, PO2ART, HCO3, O2SAT in the last 168 hours.  Liver Function Tests: No results for input(s): AST, ALT, ALKPHOS, BILITOT, PROT, ALBUMIN in the last 168 hours. No results for input(s): LIPASE, AMYLASE in the last 168 hours. No results for input(s): AMMONIA in the last 168 hours.  CBC: Recent Labs  Lab 11/17/18 0633 11/20/18 0636  WBC 12.2* 9.3  HGB 12.6 12.6  HCT 38.4 37.5  MCV 95.5 96.2  PLT 274 278    Cardiac Enzymes: No results for input(s): CKTOTAL, CKMB, CKMBINDEX, TROPONINI in the last 168 hours.  BNP (last 3 results) Recent Labs    03/22/18 1434  10/14/18 2248  BNP 1,049.0* 396.0*    ProBNP (last 3 results) No results for input(s): PROBNP in the last 8760 hours.  Radiological Exams: Ct Abdomen Pelvis Wo Contrast  Result Date: 11/21/2018 CLINICAL DATA:  Pain adjacent PEG tube for a couple days with drainage. EXAM: CT ABDOMEN AND PELVIS WITHOUT CONTRAST TECHNIQUE: Multidetector CT imaging of the abdomen and pelvis was performed following the standard protocol without IV contrast. COMPARISON:  11/09/2018 and 07/31/2017 FINDINGS: Lower chest: Mild bibasilar scarring/atelectasis unchanged. Bilateral pleural calcification adjacent the diaphragmatic borders. Mild stable cardiomegaly. Calcified plaque over the visualized coronary arteries. Calcified plaque over the descending thoracic aorta. Median sternotomy wires are present. Hepatobiliary: 8 mm gallstone is present. Liver and biliary tree are normal. Pancreas: Normal. Spleen: Normal. Adrenals/Urinary Tract: Stable 3.4 cm left adrenal adenoma. Right adrenal gland is mildly prominent but unchanged. Kidneys are normal in size, shape and position without hydronephrosis or nephrolithiasis. Ureters and bladder are normal. Stomach/Bowel: Percutaneous gastrostomy tube appears in adequate position and intact. Subtle stranding of the subcutaneous fat adjacent the tube in the anterior abdominal wall without focal fluid collection. This is likely due to recent placement. Small bowel is within normal. Appendix is normal. Mild colonic diverticulosis without evidence of diverticulitis. Vascular/Lymphatic: Moderate calcified plaque over the abdominal aorta and iliac arteries. No adenopathy. Partially visualized stent over the right femoral artery. Reproductive: Normal.  Previous bilateral tubal ligation. Other: No  free fluid or focal inflammatory change. Small umbilical hernia containing only peritoneal fat. Musculoskeletal: Mild degenerate change of the spine and hips. IMPRESSION: No acute findings in the  abdomen/pelvis. Percutaneous gastrostomy tube in adequate position and intact. Subtle stranding of the subcutaneous fat adjacent the tube likely due to recent placement. Mild bibasilar atelectasis. Evidence of asbestos related pleural disease. Stable cardiomegaly and atherosclerotic coronary artery disease. 8 mm gallstone. Stable 3.4 cm left adrenal adenoma. Small umbilical hernia containing only peritoneal fat. Mild colonic diverticulosis without active inflammation. Aortic Atherosclerosis (ICD10-I70.0). Electronically Signed   By: Marin Olp M.D.   On: 11/21/2018 14:00    Assessment/Plan Active Problems:   Acute on chronic respiratory failure with hypoxia (HCC)   Acute systolic heart failure (HCC)   Cardiac arrest (HCC)   Acute kidney failure with lesion of tubular necrosis ([place])   Tracheostomy status ([place])   1. Acute on chronic respiratory failure with hypoxia we will continue with the T collar continue secretion management pulmonary toilet. 2. Acute systolic heart failure at baseline continue present therapy 3. Cardiac arrest rhythm is been stable 4. Acute tubular necrosis resolved 5. Tracheostomy will remain in place   I have personally seen and evaluated the patient, evaluated laboratory and imaging results, formulated the assessment and plan and placed orders. The Patient requires high complexity decision making for assessment and support.  Case was discussed on Rounds with the Respiratory Therapy Staff  Allyne Gee, MD [facility] Pulmonary Critical Care Medicine Sleep Medicine

## 2018-11-22 LAB — PROTIME-INR
INR: 2.9 — ABNORMAL HIGH (ref 0.8–1.2)
Prothrombin Time: 29.9 s — ABNORMAL HIGH (ref 11.4–15.2)

## 2018-11-23 DIAGNOSIS — I110 Hypertensive heart disease with heart failure: Secondary | ICD-10-CM | POA: Diagnosis not present

## 2018-11-23 DIAGNOSIS — J9611 Chronic respiratory failure with hypoxia: Secondary | ICD-10-CM | POA: Diagnosis not present

## 2018-11-23 DIAGNOSIS — I5032 Chronic diastolic (congestive) heart failure: Secondary | ICD-10-CM | POA: Diagnosis not present

## 2018-11-23 DIAGNOSIS — E119 Type 2 diabetes mellitus without complications: Secondary | ICD-10-CM | POA: Diagnosis not present

## 2018-11-23 DIAGNOSIS — Z430 Encounter for attention to tracheostomy: Secondary | ICD-10-CM | POA: Diagnosis not present

## 2018-11-23 DIAGNOSIS — M6281 Muscle weakness (generalized): Secondary | ICD-10-CM | POA: Diagnosis not present

## 2018-11-23 DIAGNOSIS — Z7901 Long term (current) use of anticoagulants: Secondary | ICD-10-CM | POA: Diagnosis not present

## 2018-11-23 DIAGNOSIS — J441 Chronic obstructive pulmonary disease with (acute) exacerbation: Secondary | ICD-10-CM | POA: Diagnosis not present

## 2018-11-23 DIAGNOSIS — Z431 Encounter for attention to gastrostomy: Secondary | ICD-10-CM | POA: Diagnosis not present

## 2018-11-23 DIAGNOSIS — Z9981 Dependence on supplemental oxygen: Secondary | ICD-10-CM | POA: Diagnosis not present

## 2018-11-24 DIAGNOSIS — J9611 Chronic respiratory failure with hypoxia: Secondary | ICD-10-CM | POA: Diagnosis not present

## 2018-11-24 DIAGNOSIS — I5032 Chronic diastolic (congestive) heart failure: Secondary | ICD-10-CM | POA: Diagnosis not present

## 2018-11-24 DIAGNOSIS — M6281 Muscle weakness (generalized): Secondary | ICD-10-CM | POA: Diagnosis not present

## 2018-11-24 DIAGNOSIS — I110 Hypertensive heart disease with heart failure: Secondary | ICD-10-CM | POA: Diagnosis not present

## 2018-11-24 DIAGNOSIS — E119 Type 2 diabetes mellitus without complications: Secondary | ICD-10-CM | POA: Diagnosis not present

## 2018-11-24 DIAGNOSIS — J441 Chronic obstructive pulmonary disease with (acute) exacerbation: Secondary | ICD-10-CM | POA: Diagnosis not present

## 2018-11-25 DIAGNOSIS — E119 Type 2 diabetes mellitus without complications: Secondary | ICD-10-CM | POA: Diagnosis not present

## 2018-11-25 DIAGNOSIS — J9611 Chronic respiratory failure with hypoxia: Secondary | ICD-10-CM | POA: Diagnosis not present

## 2018-11-25 DIAGNOSIS — M6281 Muscle weakness (generalized): Secondary | ICD-10-CM | POA: Diagnosis not present

## 2018-11-25 DIAGNOSIS — J441 Chronic obstructive pulmonary disease with (acute) exacerbation: Secondary | ICD-10-CM | POA: Diagnosis not present

## 2018-11-25 DIAGNOSIS — I110 Hypertensive heart disease with heart failure: Secondary | ICD-10-CM | POA: Diagnosis not present

## 2018-11-25 DIAGNOSIS — I5032 Chronic diastolic (congestive) heart failure: Secondary | ICD-10-CM | POA: Diagnosis not present

## 2018-11-27 DIAGNOSIS — E119 Type 2 diabetes mellitus without complications: Secondary | ICD-10-CM | POA: Diagnosis not present

## 2018-11-27 DIAGNOSIS — I5032 Chronic diastolic (congestive) heart failure: Secondary | ICD-10-CM | POA: Diagnosis not present

## 2018-11-27 DIAGNOSIS — I110 Hypertensive heart disease with heart failure: Secondary | ICD-10-CM | POA: Diagnosis not present

## 2018-11-27 DIAGNOSIS — J9611 Chronic respiratory failure with hypoxia: Secondary | ICD-10-CM | POA: Diagnosis not present

## 2018-11-27 DIAGNOSIS — M6281 Muscle weakness (generalized): Secondary | ICD-10-CM | POA: Diagnosis not present

## 2018-11-27 DIAGNOSIS — J441 Chronic obstructive pulmonary disease with (acute) exacerbation: Secondary | ICD-10-CM | POA: Diagnosis not present

## 2018-11-28 DIAGNOSIS — E119 Type 2 diabetes mellitus without complications: Secondary | ICD-10-CM | POA: Diagnosis not present

## 2018-11-28 DIAGNOSIS — J9611 Chronic respiratory failure with hypoxia: Secondary | ICD-10-CM | POA: Diagnosis not present

## 2018-11-28 DIAGNOSIS — J441 Chronic obstructive pulmonary disease with (acute) exacerbation: Secondary | ICD-10-CM | POA: Diagnosis not present

## 2018-11-28 DIAGNOSIS — I110 Hypertensive heart disease with heart failure: Secondary | ICD-10-CM | POA: Diagnosis not present

## 2018-11-28 DIAGNOSIS — I5032 Chronic diastolic (congestive) heart failure: Secondary | ICD-10-CM | POA: Diagnosis not present

## 2018-11-28 DIAGNOSIS — M6281 Muscle weakness (generalized): Secondary | ICD-10-CM | POA: Diagnosis not present

## 2018-11-29 DIAGNOSIS — Z931 Gastrostomy status: Secondary | ICD-10-CM | POA: Diagnosis not present

## 2018-11-29 DIAGNOSIS — Z6841 Body Mass Index (BMI) 40.0 and over, adult: Secondary | ICD-10-CM | POA: Diagnosis not present

## 2018-11-29 DIAGNOSIS — E1165 Type 2 diabetes mellitus with hyperglycemia: Secondary | ICD-10-CM | POA: Diagnosis not present

## 2018-11-29 DIAGNOSIS — I5030 Unspecified diastolic (congestive) heart failure: Secondary | ICD-10-CM | POA: Diagnosis not present

## 2018-11-29 DIAGNOSIS — Z299 Encounter for prophylactic measures, unspecified: Secondary | ICD-10-CM | POA: Diagnosis not present

## 2018-11-29 DIAGNOSIS — J449 Chronic obstructive pulmonary disease, unspecified: Secondary | ICD-10-CM | POA: Diagnosis not present

## 2018-11-30 DIAGNOSIS — J9611 Chronic respiratory failure with hypoxia: Secondary | ICD-10-CM | POA: Diagnosis not present

## 2018-11-30 DIAGNOSIS — I5032 Chronic diastolic (congestive) heart failure: Secondary | ICD-10-CM | POA: Diagnosis not present

## 2018-11-30 DIAGNOSIS — I110 Hypertensive heart disease with heart failure: Secondary | ICD-10-CM | POA: Diagnosis not present

## 2018-11-30 DIAGNOSIS — E119 Type 2 diabetes mellitus without complications: Secondary | ICD-10-CM | POA: Diagnosis not present

## 2018-11-30 DIAGNOSIS — J441 Chronic obstructive pulmonary disease with (acute) exacerbation: Secondary | ICD-10-CM | POA: Diagnosis not present

## 2018-11-30 DIAGNOSIS — M6281 Muscle weakness (generalized): Secondary | ICD-10-CM | POA: Diagnosis not present

## 2018-12-04 DIAGNOSIS — J441 Chronic obstructive pulmonary disease with (acute) exacerbation: Secondary | ICD-10-CM | POA: Diagnosis not present

## 2018-12-04 DIAGNOSIS — J9611 Chronic respiratory failure with hypoxia: Secondary | ICD-10-CM | POA: Diagnosis not present

## 2018-12-04 DIAGNOSIS — I5032 Chronic diastolic (congestive) heart failure: Secondary | ICD-10-CM | POA: Diagnosis not present

## 2018-12-04 DIAGNOSIS — E119 Type 2 diabetes mellitus without complications: Secondary | ICD-10-CM | POA: Diagnosis not present

## 2018-12-04 DIAGNOSIS — I110 Hypertensive heart disease with heart failure: Secondary | ICD-10-CM | POA: Diagnosis not present

## 2018-12-04 DIAGNOSIS — M6281 Muscle weakness (generalized): Secondary | ICD-10-CM | POA: Diagnosis not present

## 2018-12-06 DIAGNOSIS — E119 Type 2 diabetes mellitus without complications: Secondary | ICD-10-CM | POA: Diagnosis not present

## 2018-12-06 DIAGNOSIS — J441 Chronic obstructive pulmonary disease with (acute) exacerbation: Secondary | ICD-10-CM | POA: Diagnosis not present

## 2018-12-06 DIAGNOSIS — I110 Hypertensive heart disease with heart failure: Secondary | ICD-10-CM | POA: Diagnosis not present

## 2018-12-06 DIAGNOSIS — M6281 Muscle weakness (generalized): Secondary | ICD-10-CM | POA: Diagnosis not present

## 2018-12-06 DIAGNOSIS — J9611 Chronic respiratory failure with hypoxia: Secondary | ICD-10-CM | POA: Diagnosis not present

## 2018-12-06 DIAGNOSIS — I5032 Chronic diastolic (congestive) heart failure: Secondary | ICD-10-CM | POA: Diagnosis not present

## 2018-12-07 DIAGNOSIS — E119 Type 2 diabetes mellitus without complications: Secondary | ICD-10-CM | POA: Diagnosis not present

## 2018-12-07 DIAGNOSIS — J441 Chronic obstructive pulmonary disease with (acute) exacerbation: Secondary | ICD-10-CM | POA: Diagnosis not present

## 2018-12-07 DIAGNOSIS — I5032 Chronic diastolic (congestive) heart failure: Secondary | ICD-10-CM | POA: Diagnosis not present

## 2018-12-07 DIAGNOSIS — I110 Hypertensive heart disease with heart failure: Secondary | ICD-10-CM | POA: Diagnosis not present

## 2018-12-07 DIAGNOSIS — J9611 Chronic respiratory failure with hypoxia: Secondary | ICD-10-CM | POA: Diagnosis not present

## 2018-12-07 DIAGNOSIS — M6281 Muscle weakness (generalized): Secondary | ICD-10-CM | POA: Diagnosis not present

## 2018-12-08 DIAGNOSIS — M6281 Muscle weakness (generalized): Secondary | ICD-10-CM | POA: Diagnosis not present

## 2018-12-08 DIAGNOSIS — I110 Hypertensive heart disease with heart failure: Secondary | ICD-10-CM | POA: Diagnosis not present

## 2018-12-08 DIAGNOSIS — E119 Type 2 diabetes mellitus without complications: Secondary | ICD-10-CM | POA: Diagnosis not present

## 2018-12-08 DIAGNOSIS — J441 Chronic obstructive pulmonary disease with (acute) exacerbation: Secondary | ICD-10-CM | POA: Diagnosis not present

## 2018-12-08 DIAGNOSIS — I5032 Chronic diastolic (congestive) heart failure: Secondary | ICD-10-CM | POA: Diagnosis not present

## 2018-12-08 DIAGNOSIS — J9611 Chronic respiratory failure with hypoxia: Secondary | ICD-10-CM | POA: Diagnosis not present

## 2018-12-11 DIAGNOSIS — I110 Hypertensive heart disease with heart failure: Secondary | ICD-10-CM | POA: Diagnosis not present

## 2018-12-11 DIAGNOSIS — I5032 Chronic diastolic (congestive) heart failure: Secondary | ICD-10-CM | POA: Diagnosis not present

## 2018-12-11 DIAGNOSIS — M6281 Muscle weakness (generalized): Secondary | ICD-10-CM | POA: Diagnosis not present

## 2018-12-11 DIAGNOSIS — J441 Chronic obstructive pulmonary disease with (acute) exacerbation: Secondary | ICD-10-CM | POA: Diagnosis not present

## 2018-12-11 DIAGNOSIS — E119 Type 2 diabetes mellitus without complications: Secondary | ICD-10-CM | POA: Diagnosis not present

## 2018-12-11 DIAGNOSIS — J9611 Chronic respiratory failure with hypoxia: Secondary | ICD-10-CM | POA: Diagnosis not present

## 2018-12-12 DIAGNOSIS — J441 Chronic obstructive pulmonary disease with (acute) exacerbation: Secondary | ICD-10-CM | POA: Diagnosis not present

## 2018-12-12 DIAGNOSIS — I5032 Chronic diastolic (congestive) heart failure: Secondary | ICD-10-CM | POA: Diagnosis not present

## 2018-12-12 DIAGNOSIS — I110 Hypertensive heart disease with heart failure: Secondary | ICD-10-CM | POA: Diagnosis not present

## 2018-12-12 DIAGNOSIS — J9611 Chronic respiratory failure with hypoxia: Secondary | ICD-10-CM | POA: Diagnosis not present

## 2018-12-12 DIAGNOSIS — M6281 Muscle weakness (generalized): Secondary | ICD-10-CM | POA: Diagnosis not present

## 2018-12-12 DIAGNOSIS — E119 Type 2 diabetes mellitus without complications: Secondary | ICD-10-CM | POA: Diagnosis not present

## 2018-12-13 DIAGNOSIS — E119 Type 2 diabetes mellitus without complications: Secondary | ICD-10-CM | POA: Diagnosis not present

## 2018-12-13 DIAGNOSIS — J449 Chronic obstructive pulmonary disease, unspecified: Secondary | ICD-10-CM | POA: Diagnosis not present

## 2018-12-13 DIAGNOSIS — I82409 Acute embolism and thrombosis of unspecified deep veins of unspecified lower extremity: Secondary | ICD-10-CM | POA: Diagnosis not present

## 2018-12-13 DIAGNOSIS — Z299 Encounter for prophylactic measures, unspecified: Secondary | ICD-10-CM | POA: Diagnosis not present

## 2018-12-13 DIAGNOSIS — I5032 Chronic diastolic (congestive) heart failure: Secondary | ICD-10-CM | POA: Diagnosis not present

## 2018-12-13 DIAGNOSIS — K9422 Gastrostomy infection: Secondary | ICD-10-CM | POA: Diagnosis not present

## 2018-12-13 DIAGNOSIS — J441 Chronic obstructive pulmonary disease with (acute) exacerbation: Secondary | ICD-10-CM | POA: Diagnosis not present

## 2018-12-13 DIAGNOSIS — J9611 Chronic respiratory failure with hypoxia: Secondary | ICD-10-CM | POA: Diagnosis not present

## 2018-12-13 DIAGNOSIS — M6281 Muscle weakness (generalized): Secondary | ICD-10-CM | POA: Diagnosis not present

## 2018-12-13 DIAGNOSIS — L03319 Cellulitis of trunk, unspecified: Secondary | ICD-10-CM | POA: Diagnosis not present

## 2018-12-13 DIAGNOSIS — I110 Hypertensive heart disease with heart failure: Secondary | ICD-10-CM | POA: Diagnosis not present

## 2018-12-13 DIAGNOSIS — Z6841 Body Mass Index (BMI) 40.0 and over, adult: Secondary | ICD-10-CM | POA: Diagnosis not present

## 2018-12-14 DIAGNOSIS — I5032 Chronic diastolic (congestive) heart failure: Secondary | ICD-10-CM | POA: Diagnosis not present

## 2018-12-14 DIAGNOSIS — J441 Chronic obstructive pulmonary disease with (acute) exacerbation: Secondary | ICD-10-CM | POA: Diagnosis not present

## 2018-12-14 DIAGNOSIS — J9611 Chronic respiratory failure with hypoxia: Secondary | ICD-10-CM | POA: Diagnosis not present

## 2018-12-14 DIAGNOSIS — E119 Type 2 diabetes mellitus without complications: Secondary | ICD-10-CM | POA: Diagnosis not present

## 2018-12-14 DIAGNOSIS — I110 Hypertensive heart disease with heart failure: Secondary | ICD-10-CM | POA: Diagnosis not present

## 2018-12-14 DIAGNOSIS — M6281 Muscle weakness (generalized): Secondary | ICD-10-CM | POA: Diagnosis not present

## 2018-12-15 DIAGNOSIS — J9611 Chronic respiratory failure with hypoxia: Secondary | ICD-10-CM | POA: Diagnosis not present

## 2018-12-15 DIAGNOSIS — E119 Type 2 diabetes mellitus without complications: Secondary | ICD-10-CM | POA: Diagnosis not present

## 2018-12-15 DIAGNOSIS — J441 Chronic obstructive pulmonary disease with (acute) exacerbation: Secondary | ICD-10-CM | POA: Diagnosis not present

## 2018-12-15 DIAGNOSIS — I110 Hypertensive heart disease with heart failure: Secondary | ICD-10-CM | POA: Diagnosis not present

## 2018-12-15 DIAGNOSIS — M6281 Muscle weakness (generalized): Secondary | ICD-10-CM | POA: Diagnosis not present

## 2018-12-15 DIAGNOSIS — I5032 Chronic diastolic (congestive) heart failure: Secondary | ICD-10-CM | POA: Diagnosis not present

## 2018-12-18 DIAGNOSIS — J441 Chronic obstructive pulmonary disease with (acute) exacerbation: Secondary | ICD-10-CM | POA: Diagnosis not present

## 2018-12-18 DIAGNOSIS — I5032 Chronic diastolic (congestive) heart failure: Secondary | ICD-10-CM | POA: Diagnosis not present

## 2018-12-18 DIAGNOSIS — M6281 Muscle weakness (generalized): Secondary | ICD-10-CM | POA: Diagnosis not present

## 2018-12-18 DIAGNOSIS — J9611 Chronic respiratory failure with hypoxia: Secondary | ICD-10-CM | POA: Diagnosis not present

## 2018-12-18 DIAGNOSIS — I110 Hypertensive heart disease with heart failure: Secondary | ICD-10-CM | POA: Diagnosis not present

## 2018-12-18 DIAGNOSIS — E119 Type 2 diabetes mellitus without complications: Secondary | ICD-10-CM | POA: Diagnosis not present

## 2018-12-21 DIAGNOSIS — J9611 Chronic respiratory failure with hypoxia: Secondary | ICD-10-CM | POA: Diagnosis not present

## 2018-12-21 DIAGNOSIS — M6281 Muscle weakness (generalized): Secondary | ICD-10-CM | POA: Diagnosis not present

## 2018-12-21 DIAGNOSIS — E119 Type 2 diabetes mellitus without complications: Secondary | ICD-10-CM | POA: Diagnosis not present

## 2018-12-21 DIAGNOSIS — I110 Hypertensive heart disease with heart failure: Secondary | ICD-10-CM | POA: Diagnosis not present

## 2018-12-21 DIAGNOSIS — I5032 Chronic diastolic (congestive) heart failure: Secondary | ICD-10-CM | POA: Diagnosis not present

## 2018-12-21 DIAGNOSIS — J441 Chronic obstructive pulmonary disease with (acute) exacerbation: Secondary | ICD-10-CM | POA: Diagnosis not present

## 2018-12-23 DIAGNOSIS — I110 Hypertensive heart disease with heart failure: Secondary | ICD-10-CM | POA: Diagnosis not present

## 2018-12-23 DIAGNOSIS — Z7901 Long term (current) use of anticoagulants: Secondary | ICD-10-CM | POA: Diagnosis not present

## 2018-12-23 DIAGNOSIS — J441 Chronic obstructive pulmonary disease with (acute) exacerbation: Secondary | ICD-10-CM | POA: Diagnosis not present

## 2018-12-23 DIAGNOSIS — J9611 Chronic respiratory failure with hypoxia: Secondary | ICD-10-CM | POA: Diagnosis not present

## 2018-12-23 DIAGNOSIS — Z431 Encounter for attention to gastrostomy: Secondary | ICD-10-CM | POA: Diagnosis not present

## 2018-12-23 DIAGNOSIS — M6281 Muscle weakness (generalized): Secondary | ICD-10-CM | POA: Diagnosis not present

## 2018-12-23 DIAGNOSIS — Z430 Encounter for attention to tracheostomy: Secondary | ICD-10-CM | POA: Diagnosis not present

## 2018-12-23 DIAGNOSIS — Z9981 Dependence on supplemental oxygen: Secondary | ICD-10-CM | POA: Diagnosis not present

## 2018-12-23 DIAGNOSIS — I5032 Chronic diastolic (congestive) heart failure: Secondary | ICD-10-CM | POA: Diagnosis not present

## 2018-12-23 DIAGNOSIS — E119 Type 2 diabetes mellitus without complications: Secondary | ICD-10-CM | POA: Diagnosis not present

## 2018-12-26 DIAGNOSIS — I5032 Chronic diastolic (congestive) heart failure: Secondary | ICD-10-CM | POA: Diagnosis not present

## 2018-12-26 DIAGNOSIS — M6281 Muscle weakness (generalized): Secondary | ICD-10-CM | POA: Diagnosis not present

## 2018-12-26 DIAGNOSIS — E119 Type 2 diabetes mellitus without complications: Secondary | ICD-10-CM | POA: Diagnosis not present

## 2018-12-26 DIAGNOSIS — J441 Chronic obstructive pulmonary disease with (acute) exacerbation: Secondary | ICD-10-CM | POA: Diagnosis not present

## 2018-12-26 DIAGNOSIS — J9611 Chronic respiratory failure with hypoxia: Secondary | ICD-10-CM | POA: Diagnosis not present

## 2018-12-26 DIAGNOSIS — I110 Hypertensive heart disease with heart failure: Secondary | ICD-10-CM | POA: Diagnosis not present

## 2018-12-27 DIAGNOSIS — Z299 Encounter for prophylactic measures, unspecified: Secondary | ICD-10-CM | POA: Diagnosis not present

## 2018-12-27 DIAGNOSIS — J449 Chronic obstructive pulmonary disease, unspecified: Secondary | ICD-10-CM | POA: Diagnosis not present

## 2018-12-27 DIAGNOSIS — I5020 Unspecified systolic (congestive) heart failure: Secondary | ICD-10-CM | POA: Diagnosis not present

## 2018-12-27 DIAGNOSIS — I5032 Chronic diastolic (congestive) heart failure: Secondary | ICD-10-CM | POA: Diagnosis not present

## 2018-12-27 DIAGNOSIS — I110 Hypertensive heart disease with heart failure: Secondary | ICD-10-CM | POA: Diagnosis not present

## 2018-12-27 DIAGNOSIS — E119 Type 2 diabetes mellitus without complications: Secondary | ICD-10-CM | POA: Diagnosis not present

## 2018-12-27 DIAGNOSIS — Z6841 Body Mass Index (BMI) 40.0 and over, adult: Secondary | ICD-10-CM | POA: Diagnosis not present

## 2018-12-27 DIAGNOSIS — J441 Chronic obstructive pulmonary disease with (acute) exacerbation: Secondary | ICD-10-CM | POA: Diagnosis not present

## 2018-12-27 DIAGNOSIS — J9611 Chronic respiratory failure with hypoxia: Secondary | ICD-10-CM | POA: Diagnosis not present

## 2018-12-27 DIAGNOSIS — M6281 Muscle weakness (generalized): Secondary | ICD-10-CM | POA: Diagnosis not present

## 2018-12-28 DIAGNOSIS — J441 Chronic obstructive pulmonary disease with (acute) exacerbation: Secondary | ICD-10-CM | POA: Diagnosis not present

## 2018-12-28 DIAGNOSIS — M6281 Muscle weakness (generalized): Secondary | ICD-10-CM | POA: Diagnosis not present

## 2018-12-28 DIAGNOSIS — I5032 Chronic diastolic (congestive) heart failure: Secondary | ICD-10-CM | POA: Diagnosis not present

## 2018-12-28 DIAGNOSIS — E119 Type 2 diabetes mellitus without complications: Secondary | ICD-10-CM | POA: Diagnosis not present

## 2018-12-28 DIAGNOSIS — I110 Hypertensive heart disease with heart failure: Secondary | ICD-10-CM | POA: Diagnosis not present

## 2018-12-28 DIAGNOSIS — J9611 Chronic respiratory failure with hypoxia: Secondary | ICD-10-CM | POA: Diagnosis not present

## 2019-01-02 DIAGNOSIS — I5032 Chronic diastolic (congestive) heart failure: Secondary | ICD-10-CM | POA: Diagnosis not present

## 2019-01-02 DIAGNOSIS — M6281 Muscle weakness (generalized): Secondary | ICD-10-CM | POA: Diagnosis not present

## 2019-01-02 DIAGNOSIS — I110 Hypertensive heart disease with heart failure: Secondary | ICD-10-CM | POA: Diagnosis not present

## 2019-01-02 DIAGNOSIS — E119 Type 2 diabetes mellitus without complications: Secondary | ICD-10-CM | POA: Diagnosis not present

## 2019-01-02 DIAGNOSIS — J9611 Chronic respiratory failure with hypoxia: Secondary | ICD-10-CM | POA: Diagnosis not present

## 2019-01-02 DIAGNOSIS — J441 Chronic obstructive pulmonary disease with (acute) exacerbation: Secondary | ICD-10-CM | POA: Diagnosis not present

## 2019-01-03 DIAGNOSIS — J9611 Chronic respiratory failure with hypoxia: Secondary | ICD-10-CM | POA: Diagnosis not present

## 2019-01-03 DIAGNOSIS — E119 Type 2 diabetes mellitus without complications: Secondary | ICD-10-CM | POA: Diagnosis not present

## 2019-01-03 DIAGNOSIS — I5032 Chronic diastolic (congestive) heart failure: Secondary | ICD-10-CM | POA: Diagnosis not present

## 2019-01-03 DIAGNOSIS — J441 Chronic obstructive pulmonary disease with (acute) exacerbation: Secondary | ICD-10-CM | POA: Diagnosis not present

## 2019-01-03 DIAGNOSIS — I110 Hypertensive heart disease with heart failure: Secondary | ICD-10-CM | POA: Diagnosis not present

## 2019-01-03 DIAGNOSIS — M6281 Muscle weakness (generalized): Secondary | ICD-10-CM | POA: Diagnosis not present

## 2019-01-04 DIAGNOSIS — I5030 Unspecified diastolic (congestive) heart failure: Secondary | ICD-10-CM | POA: Diagnosis not present

## 2019-01-04 DIAGNOSIS — J449 Chronic obstructive pulmonary disease, unspecified: Secondary | ICD-10-CM | POA: Diagnosis not present

## 2019-01-04 DIAGNOSIS — I10 Essential (primary) hypertension: Secondary | ICD-10-CM | POA: Diagnosis not present

## 2019-01-05 DIAGNOSIS — I110 Hypertensive heart disease with heart failure: Secondary | ICD-10-CM | POA: Diagnosis not present

## 2019-01-05 DIAGNOSIS — J441 Chronic obstructive pulmonary disease with (acute) exacerbation: Secondary | ICD-10-CM | POA: Diagnosis not present

## 2019-01-05 DIAGNOSIS — E119 Type 2 diabetes mellitus without complications: Secondary | ICD-10-CM | POA: Diagnosis not present

## 2019-01-05 DIAGNOSIS — I5032 Chronic diastolic (congestive) heart failure: Secondary | ICD-10-CM | POA: Diagnosis not present

## 2019-01-05 DIAGNOSIS — J9611 Chronic respiratory failure with hypoxia: Secondary | ICD-10-CM | POA: Diagnosis not present

## 2019-01-05 DIAGNOSIS — M6281 Muscle weakness (generalized): Secondary | ICD-10-CM | POA: Diagnosis not present

## 2019-01-08 DIAGNOSIS — Z431 Encounter for attention to gastrostomy: Secondary | ICD-10-CM | POA: Diagnosis not present

## 2019-01-10 DIAGNOSIS — J441 Chronic obstructive pulmonary disease with (acute) exacerbation: Secondary | ICD-10-CM | POA: Diagnosis not present

## 2019-01-10 DIAGNOSIS — I5032 Chronic diastolic (congestive) heart failure: Secondary | ICD-10-CM | POA: Diagnosis not present

## 2019-01-10 DIAGNOSIS — I110 Hypertensive heart disease with heart failure: Secondary | ICD-10-CM | POA: Diagnosis not present

## 2019-01-10 DIAGNOSIS — M6281 Muscle weakness (generalized): Secondary | ICD-10-CM | POA: Diagnosis not present

## 2019-01-10 DIAGNOSIS — J9611 Chronic respiratory failure with hypoxia: Secondary | ICD-10-CM | POA: Diagnosis not present

## 2019-01-10 DIAGNOSIS — E119 Type 2 diabetes mellitus without complications: Secondary | ICD-10-CM | POA: Diagnosis not present

## 2019-01-12 ENCOUNTER — Inpatient Hospital Stay (HOSPITAL_COMMUNITY)
Admission: EM | Admit: 2019-01-12 | Discharge: 2019-01-19 | DRG: 292 | Disposition: A | Discharge status: Home Health | Payer: Medicare Other | Attending: Cardiology | Admitting: Cardiology

## 2019-01-12 ENCOUNTER — Telehealth (INDEPENDENT_AMBULATORY_CARE_PROVIDER_SITE_OTHER): Payer: Medicare Other | Admitting: Cardiology

## 2019-01-12 ENCOUNTER — Emergency Department (HOSPITAL_COMMUNITY): Payer: Medicare Other

## 2019-01-12 ENCOUNTER — Encounter: Payer: Self-pay | Admitting: Cardiology

## 2019-01-12 ENCOUNTER — Other Ambulatory Visit: Payer: Self-pay

## 2019-01-12 VITALS — Ht <= 58 in | Wt 216.0 lb

## 2019-01-12 DIAGNOSIS — J449 Chronic obstructive pulmonary disease, unspecified: Secondary | ICD-10-CM | POA: Diagnosis present

## 2019-01-12 DIAGNOSIS — R0689 Other abnormalities of breathing: Secondary | ICD-10-CM

## 2019-01-12 DIAGNOSIS — G5793 Unspecified mononeuropathy of bilateral lower limbs: Secondary | ICD-10-CM | POA: Diagnosis not present

## 2019-01-12 DIAGNOSIS — I25118 Atherosclerotic heart disease of native coronary artery with other forms of angina pectoris: Secondary | ICD-10-CM | POA: Diagnosis not present

## 2019-01-12 DIAGNOSIS — I82409 Acute embolism and thrombosis of unspecified deep veins of unspecified lower extremity: Secondary | ICD-10-CM | POA: Diagnosis not present

## 2019-01-12 DIAGNOSIS — E1165 Type 2 diabetes mellitus with hyperglycemia: Secondary | ICD-10-CM | POA: Diagnosis not present

## 2019-01-12 DIAGNOSIS — I2510 Atherosclerotic heart disease of native coronary artery without angina pectoris: Secondary | ICD-10-CM

## 2019-01-12 DIAGNOSIS — E782 Mixed hyperlipidemia: Secondary | ICD-10-CM | POA: Diagnosis not present

## 2019-01-12 DIAGNOSIS — I5082 Biventricular heart failure: Secondary | ICD-10-CM | POA: Diagnosis present

## 2019-01-12 DIAGNOSIS — R0602 Shortness of breath: Secondary | ICD-10-CM | POA: Diagnosis not present

## 2019-01-12 DIAGNOSIS — I361 Nonrheumatic tricuspid (valve) insufficiency: Secondary | ICD-10-CM | POA: Diagnosis not present

## 2019-01-12 DIAGNOSIS — F17210 Nicotine dependence, cigarettes, uncomplicated: Secondary | ICD-10-CM | POA: Diagnosis present

## 2019-01-12 DIAGNOSIS — I252 Old myocardial infarction: Secondary | ICD-10-CM

## 2019-01-12 DIAGNOSIS — Z951 Presence of aortocoronary bypass graft: Secondary | ICD-10-CM | POA: Diagnosis not present

## 2019-01-12 DIAGNOSIS — J9611 Chronic respiratory failure with hypoxia: Secondary | ICD-10-CM | POA: Diagnosis present

## 2019-01-12 DIAGNOSIS — I5030 Unspecified diastolic (congestive) heart failure: Secondary | ICD-10-CM | POA: Diagnosis not present

## 2019-01-12 DIAGNOSIS — D6862 Lupus anticoagulant syndrome: Secondary | ICD-10-CM | POA: Diagnosis present

## 2019-01-12 DIAGNOSIS — G629 Polyneuropathy, unspecified: Secondary | ICD-10-CM | POA: Diagnosis present

## 2019-01-12 DIAGNOSIS — I2582 Chronic total occlusion of coronary artery: Secondary | ICD-10-CM | POA: Diagnosis present

## 2019-01-12 DIAGNOSIS — I5021 Acute systolic (congestive) heart failure: Secondary | ICD-10-CM | POA: Diagnosis not present

## 2019-01-12 DIAGNOSIS — I5043 Acute on chronic combined systolic (congestive) and diastolic (congestive) heart failure: Secondary | ICD-10-CM | POA: Diagnosis present

## 2019-01-12 DIAGNOSIS — Z884 Allergy status to anesthetic agent status: Secondary | ICD-10-CM

## 2019-01-12 DIAGNOSIS — Z8541 Personal history of malignant neoplasm of cervix uteri: Secondary | ICD-10-CM

## 2019-01-12 DIAGNOSIS — I10 Essential (primary) hypertension: Secondary | ICD-10-CM | POA: Diagnosis not present

## 2019-01-12 DIAGNOSIS — I25708 Atherosclerosis of coronary artery bypass graft(s), unspecified, with other forms of angina pectoris: Secondary | ICD-10-CM | POA: Diagnosis not present

## 2019-01-12 DIAGNOSIS — Z930 Tracheostomy status: Secondary | ICD-10-CM

## 2019-01-12 DIAGNOSIS — Z7901 Long term (current) use of anticoagulants: Secondary | ICD-10-CM

## 2019-01-12 DIAGNOSIS — I5033 Acute on chronic diastolic (congestive) heart failure: Secondary | ICD-10-CM | POA: Diagnosis not present

## 2019-01-12 DIAGNOSIS — E7800 Pure hypercholesterolemia, unspecified: Secondary | ICD-10-CM | POA: Diagnosis not present

## 2019-01-12 DIAGNOSIS — I110 Hypertensive heart disease with heart failure: Principal | ICD-10-CM | POA: Diagnosis present

## 2019-01-12 DIAGNOSIS — Z7189 Other specified counseling: Secondary | ICD-10-CM | POA: Diagnosis not present

## 2019-01-12 DIAGNOSIS — I509 Heart failure, unspecified: Secondary | ICD-10-CM | POA: Diagnosis not present

## 2019-01-12 DIAGNOSIS — N179 Acute kidney failure, unspecified: Secondary | ICD-10-CM | POA: Diagnosis present

## 2019-01-12 DIAGNOSIS — Z299 Encounter for prophylactic measures, unspecified: Secondary | ICD-10-CM | POA: Diagnosis not present

## 2019-01-12 DIAGNOSIS — E1140 Type 2 diabetes mellitus with diabetic neuropathy, unspecified: Secondary | ICD-10-CM | POA: Diagnosis not present

## 2019-01-12 DIAGNOSIS — Z6841 Body Mass Index (BMI) 40.0 and over, adult: Secondary | ICD-10-CM | POA: Diagnosis not present

## 2019-01-12 DIAGNOSIS — I2721 Secondary pulmonary arterial hypertension: Secondary | ICD-10-CM | POA: Diagnosis present

## 2019-01-12 DIAGNOSIS — Z8674 Personal history of sudden cardiac arrest: Secondary | ICD-10-CM

## 2019-01-12 DIAGNOSIS — E119 Type 2 diabetes mellitus without complications: Secondary | ICD-10-CM | POA: Diagnosis present

## 2019-01-12 DIAGNOSIS — I959 Hypotension, unspecified: Secondary | ICD-10-CM | POA: Diagnosis not present

## 2019-01-12 DIAGNOSIS — E785 Hyperlipidemia, unspecified: Secondary | ICD-10-CM | POA: Diagnosis present

## 2019-01-12 DIAGNOSIS — Z955 Presence of coronary angioplasty implant and graft: Secondary | ICD-10-CM

## 2019-01-12 DIAGNOSIS — Z881 Allergy status to other antibiotic agents status: Secondary | ICD-10-CM

## 2019-01-12 DIAGNOSIS — I340 Nonrheumatic mitral (valve) insufficiency: Secondary | ICD-10-CM | POA: Diagnosis not present

## 2019-01-12 DIAGNOSIS — Z794 Long term (current) use of insulin: Secondary | ICD-10-CM | POA: Diagnosis not present

## 2019-01-12 DIAGNOSIS — Z79899 Other long term (current) drug therapy: Secondary | ICD-10-CM

## 2019-01-12 DIAGNOSIS — I272 Pulmonary hypertension, unspecified: Secondary | ICD-10-CM | POA: Diagnosis present

## 2019-01-12 DIAGNOSIS — Z8249 Family history of ischemic heart disease and other diseases of the circulatory system: Secondary | ICD-10-CM

## 2019-01-12 DIAGNOSIS — E1059 Type 1 diabetes mellitus with other circulatory complications: Secondary | ICD-10-CM | POA: Diagnosis not present

## 2019-01-12 DIAGNOSIS — Z880 Allergy status to penicillin: Secondary | ICD-10-CM

## 2019-01-12 DIAGNOSIS — Z833 Family history of diabetes mellitus: Secondary | ICD-10-CM

## 2019-01-12 DIAGNOSIS — Z9851 Tubal ligation status: Secondary | ICD-10-CM

## 2019-01-12 DIAGNOSIS — Z86718 Personal history of other venous thrombosis and embolism: Secondary | ICD-10-CM

## 2019-01-12 LAB — CBC
HCT: 35.6 % — ABNORMAL LOW (ref 36.0–46.0)
Hemoglobin: 11.3 g/dL — ABNORMAL LOW (ref 12.0–15.0)
MCH: 32.2 pg (ref 26.0–34.0)
MCHC: 31.7 g/dL (ref 30.0–36.0)
MCV: 101.4 fL — ABNORMAL HIGH (ref 80.0–100.0)
Platelets: 275 10*3/uL (ref 150–400)
RBC: 3.51 MIL/uL — ABNORMAL LOW (ref 3.87–5.11)
RDW: 14.6 % (ref 11.5–15.5)
WBC: 8 10*3/uL (ref 4.0–10.5)
nRBC: 0 % (ref 0.0–0.2)

## 2019-01-12 LAB — I-STAT BETA HCG BLOOD, ED (MC, WL, AP ONLY): I-stat hCG, quantitative: 16.5 m[IU]/mL — ABNORMAL HIGH (ref <5)

## 2019-01-12 LAB — BASIC METABOLIC PANEL
Anion gap: 11 (ref 5–15)
BUN: 16 mg/dL (ref 6–20)
CO2: 31 mmol/L (ref 22–32)
Calcium: 9.3 mg/dL (ref 8.9–10.3)
Chloride: 98 mmol/L (ref 98–111)
Creatinine, Ser: 1.01 mg/dL — ABNORMAL HIGH (ref 0.44–1.00)
GFR calc Af Amer: >60 mL/min (ref >60)
GFR calc non Af Amer: >60 mL/min (ref >60)
Glucose, Bld: 238 mg/dL — ABNORMAL HIGH (ref 70–99)
Potassium: 3.7 mmol/L (ref 3.5–5.1)
Sodium: 140 mmol/L (ref 135–145)

## 2019-01-12 LAB — PROTIME-INR
INR: 1.3 — ABNORMAL HIGH (ref 0.8–1.2)
Prothrombin Time: 15.8 seconds — ABNORMAL HIGH (ref 11.4–15.2)

## 2019-01-12 LAB — BRAIN NATRIURETIC PEPTIDE: B Natriuretic Peptide: 834.6 pg/mL — ABNORMAL HIGH (ref 0.0–100.0)

## 2019-01-12 MED ORDER — ALBUTEROL SULFATE (2.5 MG/3ML) 0.083% IN NEBU: 2.5000 mg | INHALATION_SOLUTION | Freq: Two times a day (BID) | RESPIRATORY_TRACT | Status: DC

## 2019-01-12 MED ORDER — FAMOTIDINE 20 MG PO TABS
20.0000 mg | ORAL_TABLET | Freq: Every day | ORAL | Status: DC
  Administered 2019-01-13 – 2019-01-19 (×7): 20 mg via ORAL
  Filled 2019-01-12 (×7): qty 1

## 2019-01-12 MED ORDER — ACETAMINOPHEN 325 MG PO TABS
650.0000 mg | ORAL_TABLET | ORAL | Status: DC | PRN
  Administered 2019-01-13 – 2019-01-19 (×4): 650 mg via ORAL
  Filled 2019-01-12 (×4): qty 2

## 2019-01-12 MED ORDER — TAMSULOSIN HCL 0.4 MG PO CAPS
0.4000 mg | ORAL_CAPSULE | Freq: Every day | ORAL | Status: DC
  Administered 2019-01-13 – 2019-01-19 (×7): 0.4 mg via ORAL
  Filled 2019-01-12 (×7): qty 1

## 2019-01-12 MED ORDER — SODIUM CHLORIDE 0.9% FLUSH: 3.0000 mL | INTRAVENOUS | Status: DC | PRN

## 2019-01-12 MED ORDER — LORAZEPAM 1 MG PO TABS
0.5000 mg | ORAL_TABLET | Freq: Two times a day (BID) | ORAL | Status: DC
  Administered 2019-01-13 – 2019-01-19 (×13): 0.5 mg via ORAL
  Filled 2019-01-12 (×13): qty 1

## 2019-01-12 MED ORDER — SERTRALINE HCL 50 MG PO TABS
50.0000 mg | ORAL_TABLET | Freq: Every day | ORAL | Status: DC
  Administered 2019-01-13 – 2019-01-19 (×7): 50 mg via ORAL
  Filled 2019-01-12 (×7): qty 1

## 2019-01-12 MED ORDER — SODIUM CHLORIDE 0.9% FLUSH
3.0000 mL | Freq: Two times a day (BID) | INTRAVENOUS | Status: DC
  Administered 2019-01-13 – 2019-01-19 (×11): 3 mL via INTRAVENOUS

## 2019-01-12 MED ORDER — FUROSEMIDE 10 MG/ML IJ SOLN: 60.0000 mg | Freq: Once | INTRAMUSCULAR | Status: DC

## 2019-01-12 MED ORDER — IPRATROPIUM-ALBUTEROL 0.5-2.5 (3) MG/3ML IN SOLN: 3.0000 mL | RESPIRATORY_TRACT | Status: DC | PRN

## 2019-01-12 MED ORDER — FUROSEMIDE 10 MG/ML IJ SOLN
80.0000 mg | Freq: Once | INTRAMUSCULAR | Status: AC
  Administered 2019-01-12: 23:00:00 80 mg via INTRAVENOUS
  Filled 2019-01-12: qty 8

## 2019-01-12 MED ORDER — SODIUM CHLORIDE 0.9 % IV SOLN: 250.0000 mL | INTRAVENOUS | Status: DC | PRN

## 2019-01-12 MED ORDER — INSULIN GLARGINE 100 UNIT/ML ~~LOC~~ SOLN
10.0000 [IU] | Freq: Every day | SUBCUTANEOUS | Status: DC
  Administered 2019-01-13 – 2019-01-18 (×7): 10 [IU] via SUBCUTANEOUS
  Filled 2019-01-12 (×8): qty 0.1

## 2019-01-12 MED ORDER — INSULIN ASPART 100 UNIT/ML ~~LOC~~ SOLN
0.0000 [IU] | Freq: Every day | SUBCUTANEOUS | Status: DC
  Administered 2019-01-18: 2 [IU] via SUBCUTANEOUS

## 2019-01-12 MED ORDER — INSULIN ASPART 100 UNIT/ML ~~LOC~~ SOLN
0.0000 [IU] | Freq: Three times a day (TID) | SUBCUTANEOUS | Status: DC
  Administered 2019-01-13 – 2019-01-14 (×3): 3 [IU] via SUBCUTANEOUS
  Administered 2019-01-15: 2 [IU] via SUBCUTANEOUS
  Administered 2019-01-15 – 2019-01-16 (×5): 3 [IU] via SUBCUTANEOUS
  Administered 2019-01-17: 5 [IU] via SUBCUTANEOUS
  Administered 2019-01-17: 3 [IU] via SUBCUTANEOUS
  Administered 2019-01-17: 2 [IU] via SUBCUTANEOUS
  Administered 2019-01-18: 3 [IU] via SUBCUTANEOUS
  Administered 2019-01-18: 2 [IU] via SUBCUTANEOUS
  Administered 2019-01-18: 5 [IU] via SUBCUTANEOUS
  Administered 2019-01-19: 3 [IU] via SUBCUTANEOUS
  Administered 2019-01-19: 11 [IU] via SUBCUTANEOUS

## 2019-01-12 MED ORDER — POTASSIUM CHLORIDE CRYS ER 10 MEQ PO TBCR
20.0000 meq | EXTENDED_RELEASE_TABLET | Freq: Once | ORAL | Status: AC
  Administered 2019-01-12: 20 meq via ORAL
  Filled 2019-01-12: qty 2

## 2019-01-12 MED ORDER — METOPROLOL SUCCINATE ER 25 MG PO TB24
25.0000 mg | ORAL_TABLET | Freq: Every day | ORAL | Status: DC
  Administered 2019-01-13 – 2019-01-19 (×6): 25 mg via ORAL
  Filled 2019-01-12 (×7): qty 1

## 2019-01-12 NOTE — ED Notes (Signed)
Pt c/o sob with abd pain  Since her feeding tube was removed and her abd is swollen from fluid in her abd

## 2019-01-12 NOTE — ED Notes (Signed)
Admitting doctor at bedside 

## 2019-01-12 NOTE — ED Notes (Signed)
The pt still has a trach post cardiac arrests x 3 sometime in march ???

## 2019-01-12 NOTE — ED Triage Notes (Signed)
Pt presents to ED c/o SOB. Pt states that she was seen by her PCP today and was told to come be admitted. Was told by PCP that she needs to get IV lasix to get fluid off. Difficulty breathing began today.

## 2019-01-12 NOTE — ED Provider Notes (Signed)
Theresa [name] Provider Note   CSN: [id number] Arrival date & time: 01/12/19  1932    History   Chief Complaint Chief Complaint  Patient presents with  . Respiratory Distress    HPI Theresa [name] is a 53 y.o. female.     The history is provided by the patient and medical records. No language interpreter was used.   Theresa [name] is a 53 y.o. female  with a PMH as listed below including trach, CAD, CHF who presents to the Emergency [name] complaining of progressively worsening shortness of breath. She believes her dry weight is about 195, however she was 216 lbs today.  She reports lower extremity swelling for the last several days, but this morning, she reports significant shortness of breath even with the slightest exertion.  Theresa [name] chest pain today for me.  She typically takes 40 mg torsemide twice daily. She has increased this, but has not urinated any more than usual and has continued to gain about a pound a day.  Had a tele-visit with Dr. Percival Spanish of cardiology who recommended that she come to the hospital for admission for diuresis.   Past Medical History:  Diagnosis Date  . Acute kidney failure with lesion of tubular necrosis (HCC)   . Acute systolic heart failure (Theresa [name])   . CAD (coronary artery disease)    a. s/p prior PCI. b. CABG 2007 at [name] in [place] 2007. c. inferior STEMI 10/2015 s/p DES to dSVG-PDA.  . Cardiac arrest (Theresa [name])   . Cervical cancer (Theresa [name])   . Chronic diastolic CHF (congestive heart failure) (Theresa [name])   . Chronic respiratory failure (Theresa [name])    s/p tracheostomy 2002  . Chronic RUQ pain   . COPD (chronic obstructive pulmonary disease) (Theresa [name])   . Diabetes mellitus (Theresa [name])   . DVT (deep venous thrombosis) (Theresa [name])   . Endometriosis   . History of gallstones 01/2016   seen on Ultrasound  . History of HIDA scan 11/2016   normal  . HTN (hypertension)   . Hyperlipidemia   . Lupus anticoagulant disorder (HCC)     on coumadin  . Morbid obesity (Theresa [name])   . ST elevation (STEMI) myocardial infarction involving right coronary artery (Theresa [name]) 10/29/15   stent to VG to PDA  . Toe fracture, right 03/29/2018  . Tracheostomy in place [facility]), chronic since 2002 11/03/2015  . Tracheostomy status [name])     Patient Active Problem List   Diagnosis Date Noted  . Educated About Covid-19 Virus Infection 01/12/2019  . Heart failure ([place]) 01/12/2019  . Acute on chronic respiratory failure with hypoxia ([place]) 10/24/2018  . Acute systolic heart failure ([place])   . Cardiac arrest ([place])   . Acute kidney failure with lesion of tubular necrosis (HCC)   . Tracheostomy status ([place])   . Respiratory distress   . Endotracheally intubated   . Diabetic ketoacidosis without coma associated with type 1 diabetes mellitus ([place])   . Encounter for intubation 04/20/2018  . Toe fracture, left 5th phalangeal phalanx 03/29/2018  . Superficial femoral artery injury 03/24/2018  . Pulmonary hypertension, unspecified ([place])   . Unstable angina ([place]) 03/22/2018  . Acute on chronic respiratory failure with hypoxia ([place]) 08/01/2017  . Uncontrolled type 2 diabetes mellitus with hyperglycemia, with long-term current use of insulin ([place]) 08/01/2017  . Acute on chronic diastolic CHF (congestive heart failure) ([place]) 08/01/2017  . Dyspnea 07/31/2017  . Diastolic heart failure ([place]) 07/31/2017  . RUQ pain 12/21/2016  .  Nausea & vomiting   . CHF (congestive heart failure) ([place])   . Acute respiratory failure with hypoxia ([place]) 01/22/2016  . Type 2 diabetes mellitus with hyperglycemia ([place]) 01/22/2016  . CAP (community acquired pneumonia) 01/22/2016  . Tracheostomy status (Theresa [name])   . CAD (coronary artery disease) of artery bypass graft: PTCA/DES to distal body VG to PDA 10/29/15 11/03/2015  . Acute respiratory failure with hypoxemia ([place]) 11/03/2015  . Tracheostomy in place Theresa [name]), chronic since 2002 11/03/2015  . Hypokalemia 11/03/2015  . Lupus  anticoagulant syndrome ([place])   . Chronic diastolic CHF (congestive heart failure) ([place])   . Respiratory failure ([place])   . Coronary artery disease involving coronary bypass graft of native heart without angina pectoris   . OSA (obstructive sleep apnea)   . COPD exacerbation ([place])   . ST elevation myocardial infarction (STEMI) of inferior wall (Theresa [name]) 10/29/2015  . Acute ST elevation myocardial infarction (STEMI) involving right coronary artery ([place])   . Tobacco abuse   . DM (diabetes mellitus) (Theresa [name]) 06/11/2009  . Hyperlipidemia LDL goal <70 06/11/2009  . DVT 06/11/2009  . CORONARY ARTERY BYPASS GRAFT, HX OF 06/11/2009  . Essential hypertension 05/27/2009  . CAD, NATIVE VESSEL 05/27/2009    Past Surgical History:  Procedure Laterality Date  . CARDIAC CATHETERIZATION N/A 10/29/2015   Procedure: Left Heart Cath and Cors/Grafts Angiography;  Surgeon: Burnell Blanks, MD;  Location: [facility];  Service: Cardiovascular;  Laterality: N/A;  . CARDIAC CATHETERIZATION  10/29/2015   Procedure: Coronary Stent Intervention;  Surgeon: Burnell Blanks, MD;  Location: [facility];  Service: Cardiovascular;;  . CAROTID STENT    . CESAREAN SECTION WITH BILATERAL TUBAL LIGATION    . CORONARY ARTERY BYPASS GRAFT  2007   2V  . IR GASTROSTOMY TUBE MOD SED  11/13/2018  . RIGHT/LEFT HEART CATH AND CORONARY/GRAFT ANGIOGRAPHY N/A 03/24/2018   Procedure: RIGHT/LEFT HEART CATH AND CORONARY/GRAFT ANGIOGRAPHY;  Surgeon: Belva Crome, MD;  Location: [facility];  Service: Cardiovascular;  Laterality: N/A;  . TRACHEOSTOMY       OB History   No obstetric history on file.      Home Medications    Prior to Admission medications   Medication Sig Start Date End Date Taking? Authorizing Provider  albuterol (PROVENTIL) (2.5 MG/3ML) 0.083% nebulizer solution Take 3 mLs (2.5 mg total) by nebulization 2 (two) times daily. 10/19/18   Erick Colace, NP  famotidine (PEPCID) 20 MG  tablet Take 20 mg by mouth daily.    [provider]  gabapentin (NEURONTIN) 300 MG capsule Take 300 mg by mouth 3 (three) times daily.    [provider]  Insulin Degludec (TRESIBA FLEXTOUCH [place]) Inject 30 Units into the skin every morning.    [provider]  LORazepam (ATIVAN) 0.5 MG tablet Take 0.5 mg by mouth 2 (two) times daily.    [provider]  metoprolol succinate (TOPROL-XL) 25 MG 24 hr tablet Take 25 mg by mouth daily.    [provider]  nitroGLYCERIN (NITROSTAT) 0.4 MG SL tablet Place 0.4 mg under the tongue every 5 (five) minutes as needed for chest pain.    [provider]  sertraline (ZOLOFT) 50 MG tablet Take 50 mg by mouth daily.    [provider]  tamsulosin (FLOMAX) 0.4 MG CAPS capsule Take 0.4 mg by mouth daily.    [provider]  torsemide (DEMADEX) 20 MG tablet Take 2 in the morning and  1 tablet in the evening 01/04/19   [provider]  warfarin (COUMADIN) 5 MG tablet Take 1 tablet (5 mg total) by mouth one time only at 6 PM. 10/19/18   Erick Colace, NP    Family History Family History  Problem Relation Age of Onset  . Hypertension Mother   . Diabetes Mother   . Hypertension Father   . Diabetes Father     Social History Social History   Tobacco Use  . Smoking status: Current Every Day Smoker    Packs/day: 0.75    Types: Cigarettes    Start date: 10/23/1978  . Smokeless tobacco: Never Used  . Tobacco comment: 1/2 pack - trying to cut back   Substance Use Topics  . Alcohol use: No    Alcohol/week: 0.0 standard drinks  . Drug use: No     Allergies   Penicillins; Ciprofloxacin; and Ibuprofen   Review of Systems Review of Systems  Respiratory: Positive for shortness of breath.   Cardiovascular: Positive for leg swelling. Negative for chest pain and palpitations.  All other systems reviewed and are negative.    Physical Exam Updated Vital Signs BP (!) 113/101    Pulse 68   Temp 98.6 F (37 C) (Oral)   Resp (!) 22   SpO2 90%   Physical Exam Vitals signs and nursing note reviewed.  Constitutional:      General: She is not in acute distress.    Appearance: She is well-developed.  HENT:     Head: Normocephalic and atraumatic.  Neck:     Musculoskeletal: Neck supple.     Comments: Trach Cardiovascular:     Rate and Rhythm: Normal rate and regular rhythm.     Heart sounds: Normal heart sounds. No murmur.  Pulmonary:     Effort: Pulmonary effort is normal. No respiratory distress.  Abdominal:     General: There is no distension.     Palpations: Abdomen is soft.     Tenderness: There is no abdominal tenderness.  Musculoskeletal:     Right lower leg: Edema present.     Left lower leg: Edema present.  Skin:    General: Skin is warm and dry.  Neurological:     Mental Status: She is alert and oriented to person, place, and time.      ED Treatments / Results  Labs (all labs ordered are listed, but only abnormal results are displayed) Labs Reviewed  BASIC METABOLIC PANEL - Abnormal; Notable for the following components:      Result Value   Glucose, Bld 238 (*)    Creatinine, Ser 1.01 (*)    All other components within normal limits  CBC - Abnormal; Notable for the following components:   RBC 3.51 (*)    Hemoglobin 11.3 (*)    HCT 35.6 (*)    MCV 101.4 (*)    All other components within normal limits  BRAIN NATRIURETIC PEPTIDE - Abnormal; Notable for the following components:   B Natriuretic Peptide 834.6 (*)    All other components within normal limits  I-STAT BETA HCG BLOOD, ED (MC, WL, AP ONLY) - Abnormal; Notable for the following components:   I-stat hCG, quantitative 16.5 (*)    All other components within normal limits  PROTIME-INR  HEPATIC FUNCTION PANEL  BASIC METABOLIC PANEL  PROTIME-INR  TROPONIN I    EKG EKG Interpretation  Date/Time:  Friday Jan 12 2019 20:35:47 EDT Ventricular Rate:  64 PR Interval:  QRS  Duration: 88 QT Interval:  447 QTC Calculation: 462 R Axis:   42 Text Interpretation:  Sinus rhythm Consider left atrial enlargement Low voltage, precordial leads Nonspecific T abnormalities, lateral leads No significant change since last tracing Confirmed by Deno Etienne [phone]) on 01/12/2019 8:40:41 PM   Radiology Dg Chest Port 1 View  Result Date: 01/12/2019 CLINICAL DATA:  Shortness of Breath EXAM: PORTABLE CHEST 1 VIEW COMPARISON:  11/07/2018 FINDINGS: Tracheostomy tube is noted in satisfactory position. Gastric catheter is been removed in the interval. Cardiac shadow remains enlarged. Postsurgical changes are again noted. Vascular congestion with interstitial edema is noted. Postsurgical changes in the right lung base are seen. IMPRESSION: CHF with pulmonary edema. Electronically Signed   By: Inez Catalina M.D.   On: 01/12/2019 21:26    Procedures Procedures (including critical care time)  Medications Ordered in ED Medications  furosemide (LASIX) injection 80 mg (has no administration in time range)  potassium chloride (K-DUR) CR tablet 20 mEq (has no administration in time range)  metoprolol succinate (TOPROL-XL) 24 hr tablet 25 mg (has no administration in time range)  LORazepam (ATIVAN) tablet 0.5 mg (has no administration in time range)  sertraline (ZOLOFT) tablet 50 mg (has no administration in time range)  famotidine (PEPCID) tablet 20 mg (has no administration in time range)  tamsulosin (FLOMAX) capsule 0.4 mg (has no administration in time range)  albuterol (PROVENTIL) (2.5 MG/3ML) 0.083% nebulizer solution 2.5 mg (has no administration in time range)  sodium chloride flush (NS) 0.9 % injection 3 mL (has no administration in time range)  sodium chloride flush (NS) 0.9 % injection 3 mL (has no administration in time range)  0.9 %  sodium chloride infusion (has no administration in time range)  acetaminophen (TYLENOL) tablet 650 mg (has no administration in time range)      Initial Impression / Assessment and Plan / ED Course  I have reviewed the triage vital signs and the nursing notes.  Pertinent labs & imaging results that were available during my care of the patient were reviewed by me and considered in my medical decision making (see chart for details).       BRAELEY BUSKEY is a 54 y.o. female who presents to ED for lower extremity swelling and shortness of breath.  She was evaluated via telemedicine by Dr. Percival Spanish who recommended that she come to ER for likely admission for IV diuresis. On exam today, she does have LE edema and appears volume overloaded. BNP elevated as well. Cardiology consulted who will admit.   Final Clinical Impressions(s) / ED Diagnoses   Final diagnoses:  Shortness of breath  Acute on chronic congestive heart failure, unspecified heart failure type [facility])    ED Discharge Orders    None       Ward, Ozella Almond, PA-C 01/12/19 [place], DO 01/12/19 2312

## 2019-01-12 NOTE — H&P (Signed)
Cardiology Admission History and Physical:   Patient ID: Theresa [name] MRN: [id number]; DOB: 22-Apr-1966   Admission date: 01/12/2019  Primary Care Provider: Monico Blitz, MD Primary Cardiologist: [name], MD  Primary Electrophysiologist:  None   Chief Complaint:  Shortness of breath  Patient Profile:   Theresa [name] is a 53 y.o. female with   History of Present Illness:   Theresa [name] is a 53 year old woman with CAD s/p CABG 2007 (SVG -> RCA, LIMA -> LAD) chronic respiratory failure due to COPD (trach-dependent since 2002) c/b recent hypoxic PEA arrest due to dislodged trach, lupus anticoagulant on chronic warfarin, HTN, and IDDM2 who presents with 1 week of weight gain, increased LE and abdominal edema, and shortness of breath.   Per patient, last felt well 1 week ago. At baseline ambulatory about the house and able to climb stairs to her home without significant dyspnea. Despite stable dose of oral diuretic, no other changes in medications, and reportedly low salt diet (though outpatient note mentions some fast food) patient has gained weight over the last week and noted increasing edema. Her dry weight is estimated to be about 197 and today she was 217lbs today on her home scale. She was seen via video visit by Dr. Percival Spanish today who recommended that she present to the ER for admission and IV diuresis.   Of note, patient was hospitalized in 10/2018 with DKA. During admission, patient suffered PEA v. VT arrest in the setting of trach care. During, TTE notable for ?newly depressed EF, which was thought to be myocardial stunning v. Stress cardiomyopathy due to recent arrest. During admission she required aggressive diuresis and was discharged on diuretics. In the outpatient setting her lasix was recently changed to torsemide with uptitration due to persistent weight gain. She has been on metoprolol 25mg  daily but no ACEi/ARB which she thinks was discontinued during her recent  hospitalization.   She denies new chest pain, palpitations, fevers, chills, or cough. No recent steroid injections for her psoriasis. Compliant with her chronic anticoagulation.   Past Medical History:  Diagnosis Date   Acute kidney failure with lesion of tubular necrosis (HCC)    Acute systolic heart failure (HCC)    CAD (coronary artery disease)    a. s/p prior PCI. b. CABG 2007 at [facility] in [place] 2007. c. inferior STEMI 10/2015 s/p DES to dSVG-PDA.   Cardiac arrest [facility])    Cervical cancer (HCC)    Chronic diastolic CHF (congestive heart failure) (HCC)    Chronic respiratory failure (HCC)    s/p tracheostomy 2002   Chronic RUQ pain    COPD (chronic obstructive pulmonary disease) (HCC)    Diabetes mellitus ([place])    DVT (deep venous thrombosis) ([place])    Endometriosis    History of gallstones 01/2016   seen on Ultrasound   History of HIDA scan 11/2016   normal   HTN (hypertension)    Hyperlipidemia    Lupus anticoagulant disorder (HCC)    on coumadin   Morbid obesity (HCC)    ST elevation (STEMI) myocardial infarction involving right coronary artery ([place]) 10/29/15   stent to VG to PDA   Toe fracture, right 03/29/2018   Tracheostomy in place [facility]), chronic since 2002 11/03/2015   Tracheostomy status [facility])     Past Surgical History:  Procedure Laterality Date   CARDIAC CATHETERIZATION N/A 10/29/2015   Procedure: Left Heart Cath and Cors/Grafts Angiography;  Surgeon: Burnell Blanks, MD;  Location: [place] [facility];  Service: Cardiovascular;  Laterality: N/A;   CARDIAC CATHETERIZATION  10/29/2015   Procedure: Coronary Stent Intervention;  Surgeon: Burnell Blanks, MD;  Location: [place] [facility];  Service: Cardiovascular;;   CAROTID STENT     CESAREAN SECTION WITH BILATERAL TUBAL LIGATION     CORONARY ARTERY BYPASS GRAFT  2007   2V   IR GASTROSTOMY TUBE MOD SED  11/13/2018   RIGHT/LEFT HEART CATH AND CORONARY/GRAFT  ANGIOGRAPHY N/A 03/24/2018   Procedure: RIGHT/LEFT HEART CATH AND CORONARY/GRAFT ANGIOGRAPHY;  Surgeon: Belva Crome, MD;  Location: [place] [facility];  Service: Cardiovascular;  Laterality: N/A;   TRACHEOSTOMY       Medications Prior to Admission: Prior to Admission medications   Medication Sig Start Date End Date Taking? Authorizing Provider  albuterol (PROVENTIL) (2.5 MG/3ML) 0.083% nebulizer solution Take 3 mLs (2.5 mg total) by nebulization 2 (two) times daily. 10/19/18   Erick Colace, NP  famotidine (PEPCID) 20 MG tablet Take 20 mg by mouth daily.    [provider]  gabapentin (NEURONTIN) 300 MG capsule Take 300 mg by mouth 3 (three) times daily.    [provider]  Insulin Degludec (TRESIBA FLEXTOUCH [place]) Inject 30 Units into the skin every morning.    [provider]  LORazepam (ATIVAN) 0.5 MG tablet Take 0.5 mg by mouth 2 (two) times daily.    [provider]  metoprolol succinate (TOPROL-XL) 25 MG 24 hr tablet Take 25 mg by mouth daily.    [provider]  nitroGLYCERIN (NITROSTAT) 0.4 MG SL tablet Place 0.4 mg under the tongue every 5 (five) minutes as needed for chest pain.    [provider]  sertraline (ZOLOFT) 50 MG tablet Take 50 mg by mouth daily.    [provider]  tamsulosin (FLOMAX) 0.4 MG CAPS capsule Take 0.4 mg by mouth daily.    [provider]  torsemide (DEMADEX) 20 MG tablet Take 2 in the morning and 1 tablet in the evening 01/04/19   [provider]  warfarin (COUMADIN) 5 MG tablet Take 1 tablet (5 mg total) by mouth one time only at 6 PM. 10/19/18   Erick Colace, NP     Allergies:    Allergies  Allergen Reactions   Penicillins Rash    Has patient had a PCN reaction causing immediate rash, facial/tongue/throat swelling, SOB or lightheadedness with hypotension: Yes Has patient had a PCN reaction causing severe rash involving mucus membranes or skin necrosis: No Has patient  had a PCN reaction that required hospitalization No Has patient had a PCN reaction occurring within the last 10 years: No If all of the above answers are "NO", then may proceed with Cephalosporin use.   REACTION: rash   Ciprofloxacin     nausea   Ibuprofen Rash    swelling in leg     Social History:   Social History   Socioeconomic History   Marital status: Married    Spouse name: Not on file   Number of children: Not on file   Years of education: Not on file   Highest education level: Not on file  Occupational History   Not on file  Social Needs   Financial resource strain: Not on file   Food insecurity:    Worry: Not on file    Inability: Not on file   Transportation needs:    Medical: Not on file    Non-medical: Not on  file  Tobacco Use   Smoking status: Current Every Day Smoker    Packs/day: 0.75    Types: Cigarettes    Start date: 10/23/1978   Smokeless tobacco: Never Used   Tobacco comment: 1/2 pack - trying to cut back   Substance and Sexual Activity   Alcohol use: No    Alcohol/week: 0.0 standard drinks   Drug use: No   Sexual activity: Not on file  Lifestyle   Physical activity:    Days per week: Not on file    Minutes per session: Not on file   Stress: Not on file  Relationships   Social connections:    Talks on phone: Not on file    Gets together: Not on file    Attends religious service: Not on file    Active member of club or organization: Not on file    Attends meetings of clubs or organizations: Not on file    Relationship status: Not on file   Intimate partner violence:    Fear of current or ex partner: Not on file    Emotionally abused: Not on file    Physically abused: Not on file    Forced sexual activity: Not on file  Other Topics Concern   Not on file  Social History Narrative   Not on file    Family History:   The patient's family history includes Diabetes in her father and mother; Hypertension in her  father and mother.    Review of Systems: [y] = yes, [ ]  = no     General: Weight gain Blue.Reese ]; Weight loss [ ] ; Anorexia [ ] ; Fatigue Blue.Reese ]; Fever [ ] ; Chills [ ] ; Weakness [ ]    Cardiac: Chest pain/pressure [ ] ; Resting SOB Blue.Reese ]; Exertional SOB Blue.Reese ]; Orthopnea Blue.Reese ]; Pedal Edema [ y]; Palpitations [ ] ; Syncope [ ] ; Presyncope [ ] ; Paroxysmal nocturnal dyspnea[ ]    Pulmonary: Cough [ ] ; Wheezing[ ] ; Hemoptysis[ ] ; Sputum [ ] ; Snoring [ ]    GI: Vomiting[ ] ; Dysphagia[ ] ; Melena[ ] ; Hematochezia [ ] ; Heartburn[ ] ; Abdominal pain [ ] ; Constipation [ ] ; Diarrhea [ ] ; BRBPR [ ]    GU: Hematuria[ ] ; Dysuria [ ] ; Nocturia[ ]    Vascular: Pain in legs with walking [ ] ; Pain in feet with lying flat [ ] ; Non-healing sores [ ] ; Stroke [ ] ; TIA [ ] ; Slurred speech [ ] ;   Neuro: Headaches[ ] ; Vertigo[ ] ; Seizures[ ] ; Paresthesias[ ] ;Blurred vision [ ] ; Diplopia [ ] ; Vision changes [ ]    Ortho/Skin: Arthritis [ ] ; Joint pain [ ] ; Muscle pain [ ] ; Joint swelling [ ] ; Back Pain [ ] ; Rash [ ]    Psych: Depression[ ] ; Anxiety[ ]    Heme: Bleeding problems [ ] ; Clotting disorders [ ] ; Anemia [ ]    Endocrine: Diabetes [ ] ; Thyroid dysfunction[ ]   Physical Exam/Data:   Vitals:   01/12/19 1939 01/12/19 2030 01/12/19 2100 01/12/19 2130  BP: (!) 155/85 (!) 121/51 130/81 (!) 113/101  Pulse: 69 65 64 68  Resp: 18 (!) 23 (!) 21 (!) 22  Temp: 98.6 F (37 C)     TempSrc: Oral     SpO2: 90% 92% 94% 90%   No intake or output data in the 24 hours ending 01/12/19 2235 There were no vitals filed for this visit. There is no height or weight on file to calculate BMI.  General:  Pleasant woman, lying in bed, uncomfortable appearing. Trach in situ.  HEENT:  trach with PMV.  Lymph: no adenopathy Neck: no JVD to mid neck.  Endocrine:  No thryomegaly Vascular: No carotid bruits; FA pulses 2+ bilaterally without bruits  Cardiac:  normal S1, S2; RRR. Occasional ectopy.  Lungs:  Bibasilar rales to mid lung field. No  wheeze.  Abd: soft, nontender, no hepatomegaly. bandaes over recently removed PEG tube site.  Ext: 2-3++ edema. Warm. +scatter psoriatic lesions.  Musculoskeletal:  No deformities, BUE and BLE strength normal and equal Skin: warm and dry  Neuro:  CNs 2-12 intact, no focal abnormalities noted Psych:  Normal affect   EKG:  The ECG that was done  was personally reviewed and demonstrates SR with low voltage.   Relevant CV Studies: TTE 10/2018: 1. The left ventricle has not assessed. The cavity size was normal. There is no increased left ventricular wall thickness. Left ventricular diastology could not be evaluated due to nondiagnostic images.  2. The right ventricle has not been assessed systolic function. The cavity was mildly enlarged. There is not assessed.  3. Left atrial size was mildly dilated.  4. The mitral valve is normal in structure. There is mild thickening.  5. The tricuspid valve is normal in structure.  6. The aortic valve is tricuspid There is mild thickening and mild calcification of the aortic valve.  7. The pulmonic valve was grossly normal. Pulmonic valve regurgitation is mild by color flow Doppler.  8. The inferior vena cava was dilated in size with <50% respiratory variability.  9. Extremely poor acoustic windows limit study LVEF appears severely depressed with inferior, inferoseptal akinesis; hypoknesis elsewhere This is different from echo report of July 2019. 10. RV is not seen well enough to evaluate function. 11. The interatrial septum was not assessed.  Laboratory Data:  Chemistry Recent Labs  Lab 01/12/19 2009  NA 140  K 3.7  CL 98  CO2 31  GLUCOSE 238*  BUN 16  CREATININE 1.01*  CALCIUM 9.3  GFRNONAA >60  GFRAA >60  ANIONGAP 11    No results for input(s): PROT, ALBUMIN, AST, ALT, ALKPHOS, BILITOT in the last 168 hours. Hematology Recent Labs  Lab 01/12/19 2009  WBC 8.0  RBC 3.51*  HGB 11.3*  HCT 35.6*  MCV 101.4*  MCH 32.2  MCHC 31.7  RDW  14.6  PLT 275   Cardiac EnzymesNo results for input(s): TROPONINI in the last 168 hours. No results for input(s): TROPIPOC in the last 168 hours.  BNP Recent Labs  Lab 01/12/19 2009  BNP 834.6*    DDimer No results for input(s): DDIMER in the last 168 hours.  Radiology/Studies:  Dg Chest Port 1 View  Result Date: 01/12/2019 CLINICAL DATA:  Shortness of Breath EXAM: PORTABLE CHEST 1 VIEW COMPARISON:  11/07/2018 FINDINGS: Tracheostomy tube is noted in satisfactory position. Gastric catheter is been removed in the interval. Cardiac shadow remains enlarged. Postsurgical changes are again noted. Vascular congestion with interstitial edema is noted. Postsurgical changes in the right lung base are seen. IMPRESSION: CHF with pulmonary edema. Electronically Signed   By: Inez Catalina M.D.   On: 01/12/2019 21:26    Assessment and Plan:   Theresa [name] is a pleasant 53 year old woman with known CAD s/p CABG, chronic respiratory failure s/p trach, IDDM2, who presents with acute decompensated systolic heart failure in the setting of newly reduced EF noted in 10/2018. She is clearly volume overloaded on exam with worsening hypoxia, pulmonary edema on CXR, increased LE and abdominal edema, and mild AKI. Will  admit for aggressive IV diuresis, repeat echo, and titration of guideline directed medical therapies for CHF.   #Acute Decompensated Systolic HF #CAD s/p CABG -- Lasix 80mg  IV x 1 STAT -> goal net negative 2L over next 24 hours.  -- Repeat lytes + dose in AM.  -- Strict I/Os, daily weight  -- Daily BMP for K > 4.0 -- Continue home metoprolol XL 25mg  daily -- Repeat TTE in the AM -- Needs titration of GDMT when renal function returns to baseline. Consider Entresto given robust BP and possibly addition of SGLT2 given comorbid diabetes.   #IDDM2 -- Will transition insulin degludec to lantus 10U qHS to start -- ISS qAC + qHS -- Recheck HbA1c  #Lupus Anticoagulant w/ hx DVT -- Check INR (1.3) --  Heparin bridge -- Continue home warfarin 5mg  nightly with goal INR 2-3  #AKI -- Suspect 2/2 volume overload  #COPD #Chronic Hypoxemic Respiratory Failure s/p Trach -- Titrate SpO2 > 90% (baseline low 90s) -- Diuresis as above -- Duonebs PRN  Severity of Illness: The appropriate patient status for this patient is INPATIENT. Inpatient status is judged to be reasonable and necessary in order to provide the required intensity of service to ensure the patient's safety. The patient's presenting symptoms, physical exam findings, and initial radiographic and laboratory data in the context of their chronic comorbidities is felt to place them at high risk for further clinical deterioration. Furthermore, it is not anticipated that the patient will be medically stable for discharge from the hospital within 2 midnights of admission. The following factors support the patient status of inpatient.   " The patient's presenting symptoms include acute decompensated HF.  " The worrisome physical exam findings include tachypnea, edema  " The initial radiographic and laboratory data are worrisome because of pulmonary edema, elevated BNP.  " The chronic co-morbidities include COPD, DM2, lupus anticoagulant.    * I certify that at the point of admission it is my clinical judgment that the patient will require inpatient hospital care spanning beyond 2 midnights from the point of admission due to high intensity of service, high risk for further deterioration and high frequency of surveillance required.*    For questions or updates, please contact [place] Please consult [email or web address] for contact info under      Signed, Milus Banister, MD  01/12/2019 10:35 PM

## 2019-01-12 NOTE — Progress Notes (Signed)
Virtual Visit via Video Note   This visit type was conducted due to national recommendations for restrictions regarding the COVID-19 Pandemic (e.g. social distancing) in an effort to limit this patient's exposure and mitigate transmission in our community.  Due to her co-morbid illnesses, this patient is at least at moderate risk for complications without adequate follow up.  This format is felt to be most appropriate for this patient at this time.  All issues noted in this document were discussed and addressed.  A limited physical exam was performed with this format.  Please refer to the patient's chart for her consent to telehealth for [facility].   Date:  01/12/2019   ID:  Theresa [name], DOB 1965/09/27, MRN [id number]  Patient Location: Home Provider Location: Home  PCP:  Monico Blitz, MD  Cardiologist:  [name], MD  Electrophysiologist:  None   Evaluation Performed:  Follow-Up Visit  Chief Complaint:  Weight gain  History of Present Illness:    Theresa [name] is a 53 y.o. female who was sent for follow up of her weight gain.   She has a history of CAD  (s/p CABG in 2007, DES to SVG-PDA in 10/2015), tracheostomy (placed in 2002), chronic diastolic CHF, HTN, HLD, COPD, continued tobacco use, and prior DVT (known lupus anticoagulant).    She was admitted to [facility] in 03/2018 for evaluation of recurrent chest pain.  She underwent a cardiac catheterization during admission which showed total occlusion of the SVG to RCA with patent LIMA to LAD.  The RCA territory was supplied by collaterals from the LAD and LCx, therefore continued medical management was recommended. She underwent aggressive diuresis during admission having lost 27 pounds.  I note that she was in the hospital in February with respiratory failure.  She had a PEA arrest due to hypoxemia.  She had problems with her chronic trach.  She was managed by critical care management.  She was diuresed aggressively.  She  was transferred to an long care facility.  Of she did have an echo with very poor acoustic windows.  It was thought that this demonstrated a severely depressed ejection fraction but it was thought that this was secondary to the acute process that was ongoing.  No further cardiac work-up is suggested.  I spoke with her primary provider today.  She has been doing remarkably well with exception follow up by her provider.  She has been staying with her parents and was keeping her weight down.  However despite increasing her Lasix and then changing to Torsemide she has had increased edema and weight gain.  She has leg and abdominal swelling and now her face is swelling.  She is not having new PND or orthopnea.  She has not changed her salt or fluid intake.  She has been taking her meds.     At the time of her hospital follow-up visit with Dr. Percival Spanish on 04/20/2018, she denied any recent chest pain and reported her respiratory status is back to baseline. She had noticed weight on her home scales was 189 lbs upon hospital discharge and weight was 193 lbs on the office scales that day. She was euvolemic by examination and continued on Lasix 40 mg twice daily. Lisinopril was further titrated to 20 mg twice daily with plans for repeat BMET in 1 week. She was instructed to remain on Plavix along with Coumadin for anticoagulation.  In talking with the patient and her husband today, she reports having  baseline dyspnea on exertion and typically uses 3 to 4 L nasal cannula.  Denies any acute changes in this. She does report occasional episodes of chest pain since her last office visit which mostly occurs when walking around her home and resolves within 10 to 15 minutes. She has not utilized sublingual nitroglycerin due to symptoms resolving with rest. Denies any specific orthopnea, PND, or lower extremity edema.  Reports that weight has been variable on her home scales between 192 to 197 lbs. She was previously  prescribed Lasix 40 mg twice daily but has been taking this mostly as 40 mg daily and not taking the medication at all on some days due to frequent urination. They do consume fast food multiple times per week reporting this is more affordable than cooking at home.   The patient does not have symptoms concerning for COVID-19 infection (fever, chills, cough, or new shortness of breath).    Past Medical History:  Diagnosis Date   Acute kidney failure with lesion of tubular necrosis (HCC)    Acute systolic heart failure (HCC)    CAD (coronary artery disease)    a. s/p prior PCI. b. CABG 2007 at [facility] in [place] 2007. c. inferior STEMI 10/2015 s/p DES to dSVG-PDA.   Cardiac arrest [facility])    Cervical cancer (HCC)    Chronic diastolic CHF (congestive heart failure) (HCC)    Chronic respiratory failure (HCC)    s/p tracheostomy 2002   Chronic RUQ pain    COPD (chronic obstructive pulmonary disease) (HCC)    Diabetes mellitus ([place])    DVT (deep venous thrombosis) ([place])    Endometriosis    History of gallstones 01/2016   seen on Ultrasound   History of HIDA scan 11/2016   normal   HTN (hypertension)    Hyperlipidemia    Lupus anticoagulant disorder (HCC)    on coumadin   Morbid obesity (HCC)    ST elevation (STEMI) myocardial infarction involving right coronary artery ([place]) 10/29/15   stent to VG to PDA   Toe fracture, right 03/29/2018   Tracheostomy in place [facility]), chronic since 2002 11/03/2015   Tracheostomy status [facility])    Past Surgical History:  Procedure Laterality Date   CARDIAC CATHETERIZATION N/A 10/29/2015   Procedure: Left Heart Cath and Cors/Grafts Angiography;  Surgeon: Burnell Blanks, MD;  Location: [place] CV LAB;  Service: Cardiovascular;  Laterality: N/A;   CARDIAC CATHETERIZATION  10/29/2015   Procedure: Coronary Stent Intervention;  Surgeon: Burnell Blanks, MD;  Location: [place] CV LAB;  Service:  Cardiovascular;;   CAROTID STENT     CESAREAN SECTION WITH BILATERAL TUBAL LIGATION     CORONARY ARTERY BYPASS GRAFT  2007   2V   IR GASTROSTOMY TUBE MOD SED  11/13/2018   RIGHT/LEFT HEART CATH AND CORONARY/GRAFT ANGIOGRAPHY N/A 03/24/2018   Procedure: RIGHT/LEFT HEART CATH AND CORONARY/GRAFT ANGIOGRAPHY;  Surgeon: Belva Crome, MD;  Location: [place] CV LAB;  Service: Cardiovascular;  Laterality: N/A;   TRACHEOSTOMY       Prior to Admission medications   Medication Sig Start Date End Date Taking? Authorizing Provider  albuterol (PROVENTIL) (2.5 MG/3ML) 0.083% nebulizer solution Take 3 mLs (2.5 mg total) by nebulization 2 (two) times daily. 10/19/18  Yes Erick Colace, NP  famotidine (PEPCID) 20 MG tablet Take 20 mg by mouth daily.   Yes [provider]  gabapentin (NEURONTIN) 300 MG capsule Take 300 mg by mouth 3 (three) times daily.  Yes [provider]  Insulin Degludec (TRESIBA FLEXTOUCH [place]) Inject 30 Units into the skin every morning.   Yes [provider]  LORazepam (ATIVAN) 0.5 MG tablet Take 0.5 mg by mouth 2 (two) times daily.   Yes [provider]  metoprolol succinate (TOPROL-XL) 25 MG 24 hr tablet Take 25 mg by mouth daily.   Yes [provider]  nitroGLYCERIN (NITROSTAT) 0.4 MG SL tablet Place 0.4 mg under the tongue every 5 (five) minutes as needed for chest pain.   Yes [provider]  sertraline (ZOLOFT) 50 MG tablet Take 50 mg by mouth daily.   Yes [provider]  tamsulosin (FLOMAX) 0.4 MG CAPS capsule Take 0.4 mg by mouth daily.   Yes [provider]  torsemide (DEMADEX) 20 MG tablet Take 2 in the morning and 1 tablet in the evening 01/04/19  Yes [provider]  warfarin (COUMADIN) 5 MG tablet Take 1 tablet (5 mg total) by mouth one time only at 6 PM. 10/19/18  Yes Erick Colace, NP     Allergies:   Penicillins; Ciprofloxacin; and Ibuprofen   Social History   Tobacco Use     Smoking status: Current Every Day Smoker    Packs/day: 0.75    Types: Cigarettes    Start date: 10/23/1978   Smokeless tobacco: Never Used   Tobacco comment: 1/2 pack - trying to cut back   Substance Use Topics   Alcohol use: No    Alcohol/week: 0.0 standard drinks   Drug use: No     Family Hx: The patient's family history includes Diabetes in her father and mother; Hypertension in her father and mother.  ROS:   Please see the history of present illness.    As stated in the HPI and negative for all other systems.   Prior CV studies:   The following studies were reviewed today:  Recent hospital records  Labs/Other Tests and Data Reviewed:    EKG:  No ECG reviewed.  Recent Labs: 10/14/2018: ALT 27; B Natriuretic Peptide 396.0 11/20/2018: BUN 12; Creatinine, Ser 0.58; Hemoglobin 12.6; Magnesium 2.0; Platelets 278; Potassium 3.6; Sodium 136   Recent Lipid Panel Lab Results  Component Value Date/Time   CHOL 167 03/24/2018 04:51 AM   TRIG 101 03/24/2018 04:51 AM   HDL 44 03/24/2018 04:51 AM   CHOLHDL 3.8 03/24/2018 04:51 AM   LDLCALC 103 (H) 03/24/2018 04:51 AM    Wt Readings from Last 3 Encounters:  01/12/19 216 lb (98 kg)  10/19/18 225 lb 1.4 oz (102.1 kg)  05/25/18 195 lb 3.2 oz (88.5 kg)     Objective:    Vital Signs:  Ht 4\' 10"  (1.473 m)    Wt 216 lb (98 kg)    BMI 45.14 kg/m    VITAL SIGNS:  reviewed GEN:  no acute distress ABD:  Distended EYES:  sclerae anicteric, EOMI - Extraocular Movements Intact NEURO:  alert and oriented x 3, no obvious focal deficit PSYCH:  normal affect  ASSESSMENT & PLAN:    CAD The patient has no new sypmtoms.  No further cardiovascular testing is indicated.  We will continue with aggressive risk reduction and meds as listed.  ACUTE HF Her EF was 50% in the past.  However, it looked like it was markedly reduced during the admission.  I spoke with her primary provider and I do not think that she will be able to achieve  diuresis with PO meds.  She agrees to  hospitalization.  She and her provider would prefer that this be at [facility].  She will present to the ED and will need an echocardiogram.  We will be happy to advise as needed or accept in transfer.      COVID-19 Education: The signs and symptoms of COVID-19 were discussed with the patient and how to seek care for testing (follow up with PCP or arrange E-visit).  The importance of social distancing was discussed today.  We talked to the patient about not avoiding a needed hospitalization despite COVID-19  Time:   Today, I have spent 25 minutes with the patient with telehealth technology discussing the above problems.     Medication Adjustments/Labs and Tests Ordered: Current medicines are reviewed at length with the patient today.  Concerns regarding medicines are outlined above.   Tests Ordered: No orders of the defined types were placed in this encounter.   Medication Changes: No orders of the defined types were placed in this encounter.   Disposition:  Follow up with me after the hospitalization  Signed, [name], MD  01/12/2019 5:45 PM    [place]

## 2019-01-12 NOTE — Patient Instructions (Addendum)
Medication Instructions:  Continue current medications  If you need a refill on your cardiac medications before your next appointment, please call your pharmacy.  Labwork: None Ordered   Testing/Procedures: None Ordered  Follow-Up: . Your physician recommends that you schedule a follow-up appointment in: As Needed .   At [facility], you and your health needs are our priority.  As part of our continuing mission to provide you with exceptional heart care, we have created designated Provider Care Teams.  These Care Teams include your primary Cardiologist (physician) and Advanced Practice Providers (APPs -  Physician Assistants and Nurse Practitioners) who all work together to provide you with the care you need, when you need it.  Thank you for choosing [facility]!!     

## 2019-01-13 ENCOUNTER — Other Ambulatory Visit: Payer: Self-pay

## 2019-01-13 ENCOUNTER — Inpatient Hospital Stay (HOSPITAL_COMMUNITY): Payer: Medicare Other

## 2019-01-13 DIAGNOSIS — I2721 Secondary pulmonary arterial hypertension: Secondary | ICD-10-CM

## 2019-01-13 DIAGNOSIS — E1059 Type 1 diabetes mellitus with other circulatory complications: Secondary | ICD-10-CM

## 2019-01-13 DIAGNOSIS — E7800 Pure hypercholesterolemia, unspecified: Secondary | ICD-10-CM

## 2019-01-13 DIAGNOSIS — I340 Nonrheumatic mitral (valve) insufficiency: Secondary | ICD-10-CM

## 2019-01-13 DIAGNOSIS — I10 Essential (primary) hypertension: Secondary | ICD-10-CM

## 2019-01-13 DIAGNOSIS — I361 Nonrheumatic tricuspid (valve) insufficiency: Secondary | ICD-10-CM

## 2019-01-13 DIAGNOSIS — I5043 Acute on chronic combined systolic (congestive) and diastolic (congestive) heart failure: Secondary | ICD-10-CM

## 2019-01-13 DIAGNOSIS — I25708 Atherosclerosis of coronary artery bypass graft(s), unspecified, with other forms of angina pectoris: Secondary | ICD-10-CM

## 2019-01-13 LAB — GLUCOSE, CAPILLARY
Glucose-Capillary: 183 mg/dL — ABNORMAL HIGH (ref 70–99)
Glucose-Capillary: 98 mg/dL (ref 70–99)
Glucose-Capillary: 116 mg/dL — ABNORMAL HIGH (ref 70–99)
Glucose-Capillary: 192 mg/dL — ABNORMAL HIGH (ref 70–99)

## 2019-01-13 LAB — CBG MONITORING, ED: Glucose-Capillary: 159 mg/dL — ABNORMAL HIGH (ref 70–99)

## 2019-01-13 LAB — BASIC METABOLIC PANEL
Anion gap: 15 (ref 5–15)
BUN: 16 mg/dL (ref 6–20)
CO2: 28 mmol/L (ref 22–32)
Calcium: 9.1 mg/dL (ref 8.9–10.3)
Chloride: 96 mmol/L — ABNORMAL LOW (ref 98–111)
Creatinine, Ser: 0.97 mg/dL (ref 0.44–1.00)
GFR calc Af Amer: >60 mL/min (ref >60)
GFR calc non Af Amer: >60 mL/min (ref >60)
Glucose, Bld: 237 mg/dL — ABNORMAL HIGH (ref 70–99)
Potassium: 4.3 mmol/L (ref 3.5–5.1)
Sodium: 139 mmol/L (ref 135–145)

## 2019-01-13 LAB — TROPONIN I: Troponin I: <0.03 ng/mL (ref <0.03)

## 2019-01-13 LAB — HEMOGLOBIN A1C
Hgb A1c MFr Bld: 6.3 % — ABNORMAL HIGH (ref 4.8–5.6)
Mean Plasma Glucose: 134.11 mg/dL

## 2019-01-13 LAB — HEPARIN LEVEL (UNFRACTIONATED)
Heparin Unfractionated: 0.21 IU/mL — ABNORMAL LOW (ref 0.30–0.70)
Heparin Unfractionated: 0.29 IU/mL — ABNORMAL LOW (ref 0.30–0.70)

## 2019-01-13 LAB — ECHOCARDIOGRAM LIMITED
Height: 58 in
Weight: 3452.8 oz

## 2019-01-13 LAB — HEPATIC FUNCTION PANEL
ALT: 22 U/L (ref 0–44)
AST: 23 U/L (ref 15–41)
Albumin: 3.1 g/dL — ABNORMAL LOW (ref 3.5–5.0)
Alkaline Phosphatase: 121 U/L (ref 38–126)
Bilirubin, Direct: 0.4 mg/dL — ABNORMAL HIGH (ref 0.0–0.2)
Indirect Bilirubin: 1 mg/dL — ABNORMAL HIGH (ref 0.3–0.9)
Total Bilirubin: 1.4 mg/dL — ABNORMAL HIGH (ref 0.3–1.2)
Total Protein: 7.1 g/dL (ref 6.5–8.1)

## 2019-01-13 LAB — MAGNESIUM: Magnesium: 1.9 mg/dL (ref 1.7–2.4)

## 2019-01-13 LAB — MRSA PCR SCREENING: MRSA by PCR: NEGATIVE

## 2019-01-13 LAB — PROTIME-INR
INR: 1.2 (ref 0.8–1.2)
Prothrombin Time: 15.3 seconds — ABNORMAL HIGH (ref 11.4–15.2)

## 2019-01-13 LAB — HCG, QUANTITATIVE, PREGNANCY: hCG, Beta Chain, Quant, S: 5 m[IU]/mL — ABNORMAL HIGH (ref <5)

## 2019-01-13 MED ORDER — WARFARIN - PHARMACIST DOSING INPATIENT
Freq: Every day | Status: DC
  Administered 2019-01-16 – 2019-01-17 (×2)

## 2019-01-13 MED ORDER — WARFARIN SODIUM 7.5 MG PO TABS
7.5000 mg | ORAL_TABLET | Freq: Once | ORAL | Status: AC
  Administered 2019-01-13: 7.5 mg via ORAL
  Filled 2019-01-13: qty 1

## 2019-01-13 MED ORDER — ALBUTEROL SULFATE (2.5 MG/3ML) 0.083% IN NEBU: 2.5000 mg | INHALATION_SOLUTION | Freq: Four times a day (QID) | RESPIRATORY_TRACT | Status: DC | PRN

## 2019-01-13 MED ORDER — HEPARIN (PORCINE) 25000 UT/250ML-% IV SOLN
1450.0000 [IU]/h | INTRAVENOUS | Status: DC
  Administered 2019-01-13: 1200 [IU]/h via INTRAVENOUS
  Administered 2019-01-14 – 2019-01-17 (×5): 1450 [IU]/h via INTRAVENOUS
  Filled 2019-01-13 (×5): qty 250

## 2019-01-13 MED ORDER — MAGNESIUM SULFATE IN D5W 1-5 GM/100ML-% IV SOLN
1.0000 g | Freq: Once | INTRAVENOUS | Status: AC
  Administered 2019-01-13: 1 g via INTRAVENOUS
  Filled 2019-01-13: qty 100

## 2019-01-13 MED ORDER — WARFARIN SODIUM 5 MG PO TABS: 5.0000 mg | ORAL_TABLET | Freq: Every day | ORAL | Status: DC

## 2019-01-13 MED ORDER — LOSARTAN POTASSIUM 25 MG PO TABS
25.0000 mg | ORAL_TABLET | Freq: Every day | ORAL | Status: DC
  Administered 2019-01-13 – 2019-01-19 (×6): 25 mg via ORAL
  Filled 2019-01-13 (×7): qty 1

## 2019-01-13 MED ORDER — HEPARIN BOLUS VIA INFUSION
2000.0000 [IU] | Freq: Once | INTRAVENOUS | Status: AC
  Administered 2019-01-13: 2000 [IU] via INTRAVENOUS
  Filled 2019-01-13: qty 2000

## 2019-01-13 MED ORDER — PERFLUTREN LIPID MICROSPHERE
1.0000 mL | INTRAVENOUS | Status: AC | PRN
  Administered 2019-01-13: 6 mL via INTRAVENOUS
  Filled 2019-01-13: qty 10

## 2019-01-13 MED ORDER — GABAPENTIN 300 MG PO CAPS
600.0000 mg | ORAL_CAPSULE | Freq: Every day | ORAL | Status: DC
  Administered 2019-01-13 – 2019-01-18 (×6): 600 mg via ORAL
  Filled 2019-01-13 (×6): qty 2

## 2019-01-13 MED ORDER — ROSUVASTATIN CALCIUM 5 MG PO TABS
10.0000 mg | ORAL_TABLET | Freq: Every day | ORAL | Status: DC
  Administered 2019-01-13 – 2019-01-18 (×6): 10 mg via ORAL
  Filled 2019-01-13 (×6): qty 2

## 2019-01-13 MED ORDER — FUROSEMIDE 10 MG/ML IJ SOLN
80.0000 mg | Freq: Once | INTRAMUSCULAR | Status: AC
  Administered 2019-01-13: 80 mg via INTRAVENOUS
  Filled 2019-01-13: qty 8

## 2019-01-13 NOTE — Progress Notes (Addendum)
ANTICOAGULATION CONSULT NOTE - Initial Consult  Pharmacy Consult for Heparin/Coumadin Indication: h/o DVT  Allergies  Allergen Reactions  . Penicillins Rash    Has patient had a PCN reaction causing immediate rash, facial/tongue/throat swelling, SOB or lightheadedness with hypotension: Yes Has patient had a PCN reaction causing severe rash involving mucus membranes or skin necrosis: No Has patient had a PCN reaction that required hospitalization No Has patient had a PCN reaction occurring within the last 10 years: No If all of the above answers are "NO", then may proceed with Cephalosporin use.   REACTION: rash  . Ciprofloxacin     nausea  . Ibuprofen Rash    swelling in leg     Patient Measurements:   Heparin Dosing Weight: 65.2 kg  Vital Signs: Temp: 98.6 F (37 C) (05/09 0050) Temp Source: Oral (05/09 0050) BP: 139/97 (05/09 0050) Pulse Rate: 82 (05/09 0050)  Labs: Recent Labs    01/12/19 2009 01/12/19 2244  HGB 11.3*  --   HCT 35.6*  --   PLT 275  --   LABPROT  --  15.8*  INR  --  1.3*  CREATININE 1.01*  --   TROPONINI  --  <0.03    Estimated Creatinine Clearance: 64.8 mL/min (A) (by C-G formula based on SCr of 1.01 mg/dL (H)).   Medical History: Past Medical History:  Diagnosis Date  . Acute kidney failure with lesion of tubular necrosis (HCC)   . Acute systolic heart failure ([place])   . CAD (coronary artery disease)    a. s/p prior PCI. b. CABG 2007 at [facility] in [place] 2007. c. inferior STEMI 10/2015 s/p DES to dSVG-PDA.  . Cardiac arrest ([place])   . Cervical cancer ([place])   . Chronic diastolic CHF (congestive heart failure) ([place])   . Chronic respiratory failure ([place])    s/p tracheostomy 2002  . Chronic RUQ pain   . COPD (chronic obstructive pulmonary disease) ([place])   . Diabetes mellitus ([place])   . DVT (deep venous thrombosis) ([place])   . Endometriosis   . History of gallstones 01/2016   seen on Ultrasound  . History of HIDA scan  11/2016   normal  . HTN (hypertension)   . Hyperlipidemia   . Lupus anticoagulant disorder (HCC)    on coumadin  . Morbid obesity ([place])   . ST elevation (STEMI) myocardial infarction involving right coronary artery ([place]) 10/29/15   stent to VG to PDA  . Toe fracture, right 03/29/2018  . Tracheostomy in place [facility]), chronic since 2002 11/03/2015  . Tracheostomy status (HCC)     Medications:  No current facility-administered medications on file prior to encounter.    Current Outpatient Medications on File Prior to Encounter  Medication Sig Dispense Refill  . albuterol (PROVENTIL) (2.5 MG/3ML) 0.083% nebulizer solution Take 3 mLs (2.5 mg total) by nebulization 2 (two) times daily. (Patient taking differently: Take 2.5 mg by nebulization every 4 (four) hours as needed for wheezing. ) 75 mL 12  . famotidine (PEPCID) 20 MG tablet Take 20 mg by mouth daily.    Marland Kitchen gabapentin (NEURONTIN) 300 MG capsule Take 600 mg by mouth at bedtime.     . Insulin Degludec (TRESIBA FLEXTOUCH [place]) Inject 30 Units into the skin every morning.    Marland Kitchen LORazepam (ATIVAN) 0.5 MG tablet Take 0.5 mg by mouth every 6 (six) hours as needed for anxiety.     . metoprolol succinate (TOPROL-XL) 25 MG 24 hr tablet Take 25  mg by mouth daily.    . nitroGLYCERIN (NITROSTAT) 0.4 MG SL tablet Place 0.4 mg under the tongue every 5 (five) minutes as needed for chest pain.    Marland Kitchen sertraline (ZOLOFT) 50 MG tablet Take 50 mg by mouth daily.    . tamsulosin (FLOMAX) 0.4 MG CAPS capsule Take 0.4 mg by mouth daily.    Marland Kitchen torsemide (DEMADEX) 20 MG tablet Take 20-40 mg by mouth 2 (two) times daily. Take 2 in the morning and 1 tablet in the evening    . warfarin (COUMADIN) 5 MG tablet Take 1 tablet (5 mg total) by mouth one time only at 6 PM. (Patient taking differently: Take 2.5-5 mg by mouth daily. Take 5 mg everyday except Sunday take 2.5 mg)      Assessment: 53 y.o. female admitted with CHF exacerbation, h/o DVT/lupus anticoagulant and  subtherapeutic INR, for heparin.   INR subtherapeutic at 1.2. Patient states her blood was "thick" at her last INR visit. Denies missed doses in the past week, denies changes in diet.   Anti-Xa level also subtherapeutic at 0.21. No bleeding noted and confirmed with nursing. Hemoglobin 11.3, platelets 275.   Confirms her home dose is 5mg  daily except 2.5mg  on Sunday.   Goal of Therapy:  INR 2-3 Heparin level 0.3-0.7 Monitor platelets by anticoagulation protocol: Yes   Plan:  Increase heparin to 1300 units/hr Warfarin 7.5 mg  Check heparin level in 8 hours.  Daily INR, CBC, and heparin level Monitor for signs/symptoms of bleeding   Addendum: Patient with positive HCG. Will hold warfarin. Spoke with Maryla Morrow, NP - who will let pharmacy know if false and to resume warfarin.   Azzie Roup D PGY1 Pharmacy Resident  Phone [phone] Please use AMION for clinical pharmacists numbers  01/13/2019      1:14 PM

## 2019-01-13 NOTE — Progress Notes (Signed)
Echocardiogram was done, Echo tech came out stated that patient c/o radiating pain on neck through down to buttocks after she received definity which is one of side effect of definity. Continue to monitor this symptoms and it will be goes away after 30 min normally, but if not will call MD regarding this matter. Pt had HCG test which was high. Pt denied no sexual activity long time ago. However, repeat HCG test was done for make sure. HS [employer]

## 2019-01-13 NOTE — Progress Notes (Signed)
[place] for Heparin/Coumadin Indication: h/o DVT  Allergies  Allergen Reactions  . Penicillins Rash    Has patient had a PCN reaction causing immediate rash, facial/tongue/throat swelling, SOB or lightheadedness with hypotension: Yes Has patient had a PCN reaction causing severe rash involving mucus membranes or skin necrosis: No Has patient had a PCN reaction that required hospitalization No Has patient had a PCN reaction occurring within the last 10 years: No If all of the above answers are "NO", then may proceed with Cephalosporin use.   REACTION: rash  . Ciprofloxacin     nausea  . Ibuprofen Rash    swelling in leg     Patient Measurements: Weight: 215 lb 12.8 oz (97.9 kg)(A scale) Heparin Dosing Weight: 65.2 kg  Vital Signs: Temp: 98.2 F (36.8 C) (05/09 1956) Temp Source: Oral (05/09 1956) BP: 117/52 (05/09 1956) Pulse Rate: 73 (05/09 1956)  Labs: Recent Labs    01/12/19 2009 01/12/19 2244 01/13/19 0224 01/13/19 1219 01/13/19 2026  HGB 11.3*  --   --   --   --   HCT 35.6*  --   --   --   --   PLT 275  --   --   --   --   LABPROT  --  15.8*  --  15.3*  --   INR  --  1.3*  --  1.2  --   HEPARINUNFRC  --   --   --  0.21* 0.29*  CREATININE 1.01*  --  0.97  --   --   TROPONINI  --  <0.03  --   --   --     Estimated Creatinine Clearance: 67.4 mL/min (by C-G formula based on SCr of 0.97 mg/dL).   Assessment: 53 y.o. female admitted with CHF exacerbation, h/o DVT/lupus anticoagulant and subtherapeutic INR, for heparin.   INR subtherapeutic at 1.2. Patient states her blood was "thick" at her last INR visit. Denies missed doses in the past week, denies changes in diet.   Confirms her home dose is 5mg  daily except 2.5mg  on Sunday.   PM heparin level 0.29  Goal of Therapy:  INR 2-3 Heparin level 0.3-0.7 Monitor platelets by anticoagulation protocol: Yes   Plan:  Increase heparin to 1450 units/hr Daily INR, CBC,  and heparin level Monitor for signs/symptoms of bleeding    Thank you Anette Guarneri, PharmD [phone] Please use AMION for clinical pharmacists numbers  01/13/2019      9:09 PM

## 2019-01-13 NOTE — Plan of Care (Signed)
  Problem: Activity: Goal: Capacity to carry out activities will improve Outcome: Progressing   Problem: Cardiac: Goal: Ability to achieve and maintain adequate cardiopulmonary perfusion will improve Outcome: Progressing   Problem: Activity: Goal: Risk for activity intolerance will decrease Outcome: Progressing   Problem: Elimination: Goal: Will not experience complications related to bowel motility Outcome: Progressing

## 2019-01-13 NOTE — ED Notes (Signed)
Report called to rn on 3e 

## 2019-01-13 NOTE — Progress Notes (Signed)
  Echocardiogram 2D Echocardiogram with Definity has been performed.   Patient complained of back and neck pain after Definity was used. Nurse notified before leaving room.  Theresa [name] 01/13/2019, 4:30 PM

## 2019-01-13 NOTE — Progress Notes (Signed)
Progress Note  Patient Name: Theresa [name] Date of Encounter: 01/13/2019  Primary Cardiologist: [name], MD   Subjective   Feels substantially better following roughly 1.4 L net diuresis overnight. Oxygen saturation in the 92-93% range. Lying in bed with 25 degree head of bed elevation, looks comfortable. Proved lower extremity edema.  Inpatient Medications    Scheduled Meds: . albuterol  2.5 mg Nebulization BID  . famotidine  20 mg Oral Daily  . insulin aspart  0-15 Units Subcutaneous TID WC  . insulin aspart  0-5 Units Subcutaneous QHS  . insulin glargine  10 Units Subcutaneous QHS  . LORazepam  0.5 mg Oral BID  . metoprolol succinate  25 mg Oral Daily  . sertraline  50 mg Oral Daily  . sodium chloride flush  3 mL Intravenous Q12H  . tamsulosin  0.4 mg Oral Daily  . Warfarin - Pharmacist Dosing Inpatient   Does not apply q1800   Continuous Infusions: . sodium chloride    . heparin 1,200 Units/hr (01/13/19 0308)  . magnesium sulfate bolus IVPB     PRN Meds: sodium chloride, acetaminophen, ipratropium-albuterol, sodium chloride flush   Vital Signs    Vitals:   01/13/19 0050 01/13/19 0056 01/13/19 0336 01/13/19 0354  BP: (!) 139/97   (!) 162/82  Pulse: 82  72 80  Resp: 20  20 (!) 24  Temp: 98.6 F (37 C)   98.2 F (36.8 C)  TempSrc: Oral   Oral  SpO2: 90%  92% 93%  Weight:  97.9 kg      Intake/Output Summary (Last 24 hours) at 01/13/2019 0830 Last data filed at 01/13/2019 0400 Gross per 24 hour  Intake -  Output 1400 ml  Net -1400 ml   Last 3 Weights 01/13/2019 01/12/2019 10/19/2018  Weight (lbs) 215 lb 12.8 oz 216 lb 225 lb 1.4 oz  Weight (kg) 97.886 kg 97.977 kg 102.1 kg      Telemetry    Sinus rhythm- Personally Reviewed  ECG    Sinus rhythm with generalized low voltage, and lateral T wave inversion- Personally Reviewed  Physical Exam  Appears tired and chronically ill GEN: No acute distress.   Neck: No JVD Cardiac: RRR, no murmurs,  rubs, or gallops.  Respiratory: Clear to auscultation bilaterally. GI: Soft, nontender, non-distended  MS:  1+ pretibial and ankle symmetrical edema; No deformity. Neuro:  Nonfocal  Psych: Normal affect  Skin: Scaly psoriatiform lesions on limbs  Labs    Chemistry Recent Labs  Lab 01/12/19 2009 01/12/19 2244 01/13/19 0224  NA 140  --  139  K 3.7  --  4.3  CL 98  --  96*  CO2 31  --  28  GLUCOSE 238*  --  237*  BUN 16  --  16  CREATININE 1.01*  --  0.97  CALCIUM 9.3  --  9.1  PROT  --  7.1  --   ALBUMIN  --  3.1*  --   AST  --  23  --   ALT  --  22  --   ALKPHOS  --  121  --   BILITOT  --  1.4*  --   GFRNONAA >60  --  >60  GFRAA >60  --  >60  ANIONGAP 11  --  15     Hematology Recent Labs  Lab 01/12/19 2009  WBC 8.0  RBC 3.51*  HGB 11.3*  HCT 35.6*  MCV 101.4*  MCH 32.2  MCHC  31.7  RDW 14.6  PLT 275    Cardiac Enzymes Recent Labs  Lab 01/12/19 2244  TROPONINI <0.03   No results for input(s): TROPIPOC in the last 168 hours.   BNP Recent Labs  Lab 01/12/19 2009  BNP 834.6*     DDimer No results for input(s): DDIMER in the last 168 hours.   Radiology    Dg Chest Port 1 View  Result Date: 01/12/2019 CLINICAL DATA:  Shortness of Breath EXAM: PORTABLE CHEST 1 VIEW COMPARISON:  11/07/2018 FINDINGS: Tracheostomy tube is noted in satisfactory position. Gastric catheter is been removed in the interval. Cardiac shadow remains enlarged. Postsurgical changes are again noted. Vascular congestion with interstitial edema is noted. Postsurgical changes in the right lung base are seen. IMPRESSION: CHF with pulmonary edema. Electronically Signed   By: Inez Catalina M.D.   On: 01/12/2019 21:26    Cardiac Studies  Right and left heart catheterization March 24, 2018  Severe native coronary artery disease with total occlusion of the proximal LAD, total occlusion of the proximal RCA, and patent circumflex with patent proximal stent with eccentric 50% proximal  narrowing.  Distal obtuse marginal branches are severely and diffusely diseased.  Bypass graft failure with total occlusion of SVG to RCA.  Patent LIMA to LAD.  Right coronary territory is supplied by collaterals from LAD and circumflex.  Left ventricular systolic dysfunction with EF 35 to 45%.  Mid to distal inferior wall akinesis.  Inferobasal and inferoapical wall severely hypokinetic.  Left ventricular end-diastolic pressure elevated at 19 mmHg  Moderate to severe pulmonary hypertension  Diagnostic  Dominance: Right    Fick Cardiac Output 3.32 L/min  Fick Cardiac Output Index 1.84 (L/min)/BSA  RA A Wave 12 mmHg  RA V Wave 17 mmHg  RA Mean 11 mmHg  RV Systolic Pressure 70 mmHg  RV Diastolic Pressure 6 mmHg  RV EDP 15 mmHg  PA Systolic Pressure 66 mmHg  PA Diastolic Pressure 21 mmHg  PA Mean 40 mmHg  PW A Wave 20 mmHg  PW V Wave 12 mmHg  PW Mean 15 mmHg  AO Systolic Pressure 782 mmHg  AO Diastolic Pressure 63 mmHg  AO Mean 83 mmHg  LV Systolic Pressure 956 mmHg  LV Diastolic Pressure 11 mmHg  LV EDP 19 mmHg  AOp Systolic Pressure 213 mmHg  AOp Diastolic Pressure 63 mmHg  AOp Mean Pressure 82 mmHg  LVp Systolic Pressure 086 mmHg  LVp Diastolic Pressure 9 mmHg  LVp EDP Pressure 19 mmHg  QP/QS 1  TPVR Index 21.71 HRUI  TSVR Index 45.06 HRUI  PVR SVR Ratio 0.35  TPVR/TSVR Ratio 0.48   Echo from October 16, 2018 was essentially uninterpretable  Echo from March 25, 2018 - Left ventricle: The cavity size was normal. There was mild focal   basal hypertrophy of the septum. Systolic function was normal.   The estimated ejection fraction was 50%. Doppler parameters are   consistent with a reversible restrictive pattern, indicative of   decreased left ventricular diastolic compliance and/or increased   left atrial pressure (grade 3 diastolic dysfunction). - Regional wall motion abnormality: Akinesis of the mid   anteroseptal myocardium; hypokinesis of the mid  inferoseptal,   mid-apical inferior, and apical septal myocardium. - Aortic valve: Transvalvular velocity was within the normal range.   There was no stenosis. There was no regurgitation. - Mitral valve: Mobility was mildly restricted. There was mild   regurgitation. - Left atrium: The atrium was moderately dilated. - Right  ventricle: The cavity size was mildly dilated. Wall   thickness was normal. Systolic function was normal. - Right atrium: The atrium was moderately dilated. - Atrial septum: No defect or patent foramen ovale was identified. - Tricuspid valve: There was mild regurgitation. - Pulmonic valve: There was no significant regurgitation. - Pulmonary arteries: PA peak pressure: 42 mm Hg (S).  Impressions:  - Globale EF about 50% with regional wall motion abnormalities as   above. Elevated right sided filling pressures.   Patient Profile     53 y.o. female admitted with acute on chronic combined systolic and diastolic biventricular heart failure, premature onset CAD (CABG 2007, DES to SVG-PDA 2017, now with known total occlusion of the SVG-PDA as well as total occlusion of the native RCA, total occlusion of the native LAD with patent LIMA to LAD, 50% stenosis in previously stented left circumflex coronary artery with high-grade stenoses and oblique marginal arteries, diffusely diseased),, chronic respiratory insufficiency and tracheostomy since 2002, COPD, hypertension, hyperlipidemia, moderate to severe pulmonary artery hypertension, history of DVT and lupus anticoagulant on chronic warfarin therapy, currently subtherapeutic.  Assessment & Plan    1. CHF with reduced systolic function: Failed to respond to outpatient escalation of loop diuretics, no improvement with intravenous furosemide.  Continue IV diuretics.  Target dry weight is roughly 192- 197 pounds, per the patient's report.  Note however that at the time of her right and left heart catheterization in July 2019 when  her wedge pressure was 15 mmHg she only weighed 173 pounds.  Anticipate at least another 48 hours of IV diuretics.  Repeat echocardiogram, but believe it is much more likely that she has moderately depressed LVEF based on LV angiogram findings and coronary anatomy, rather than the reported EF of 50% on echo from last year. Needs to start on RAAS inhibitors.  Based on review of systems, sodium dietary indiscretion is most likely cause for current episode of decompensation. 2. PAH: Moderate to severe pulmonary artery hypertension.  Right heart catheterization results from last year demonstrate that this is primarily due to to intrinsic pulmonary arteriolar obstruction, rather than left heart failure (transpulmonary gradient 25 mmHg).  Chronic hypoventilation, COPD, possibly chronic venous thromboembolic events are playing a bigger role in the pulmonary hypertension than left heart failure is. 3. CAD: Severe native and graft vessel disease, primarily receives myocardial blood flow through the LIMA to LAD bypass surgery and severely diseased left circumflex coronary system.  Per previous notes she does have stable angina pectoris, CCS functional class II-3 (angina when walking around the house, resolved spontaneously without sublingual nitroglycerin).  Currently angina free, low suspicion for acute coronary insufficiency. 4.  Chronic respiratory insufficiency with hypoxia: on home O2, usually 3-4 L/minute, chronic tracheostomy, history of COPD.  Previous ABGs suggest mild chronic hypercapnia/CO2 retaining with typical PCO2 45-50.  Note history of PEA cardiac arrest a few months ago when she had severe hypoxemia in the setting of a dislodged tracheostomy. 5.  History of DVT/lupus anticoagulant/chronic anticoagulation: Barring serious bleeding complications this should be continued indefinitely.  Subtherapeutic INR on admission. 6. DM on insulin: Historically poorly controlled, but most recent hemoglobin A1c is 6.3%.  7. HLP: Most recent LDL cholesterol was 103, target less than 70.  Per notes she did not tolerate high-dose statins but was taking rosuvastatin 10 mg daily.  Restart this. 8. HTN: Target BP less than 130/80.  Start ARB.  If EF is less than 40%, good candidate for Entresto.   For questions  or updates, please contact [place] Please consult [email or web address] for contact info under        Signed, Sanda Klein, MD  01/13/2019, 8:30 AM

## 2019-01-13 NOTE — Progress Notes (Signed)
Pt is on ACT 80% due to SATS being below 90 otherwise. Pt instructed to place pt on continuous pulse ox. Sp02 now 92%.  Will continue to monitor.

## 2019-01-13 NOTE — Progress Notes (Signed)
Pt resolved pain on neck through the back site regarding difinity side effection.Patient c/o both lower legs pain which was burning sensation. Pt had this pain before but it was more severe than in the past. Pt usually take Neurontin for relieving that pain. There is no redness, but +1 edema. Pt wanted to try tylenol this time and there was no Neurontin order. Monitor her pain on both legs. HS [employer]

## 2019-01-13 NOTE — Progress Notes (Signed)
RT Note:  Patient stated that the trach clinic is currently closed, patient was asking about the possibility of a trach change while she is here at the hospital.  I told patient I would put in a note.

## 2019-01-14 LAB — GLUCOSE, CAPILLARY
Glucose-Capillary: 112 mg/dL — ABNORMAL HIGH (ref 70–99)
Glucose-Capillary: 170 mg/dL — ABNORMAL HIGH (ref 70–99)
Glucose-Capillary: 200 mg/dL — ABNORMAL HIGH (ref 70–99)
Glucose-Capillary: 162 mg/dL — ABNORMAL HIGH (ref 70–99)

## 2019-01-14 LAB — BASIC METABOLIC PANEL
Anion gap: 13 (ref 5–15)
BUN: 14 mg/dL (ref 6–20)
CO2: 33 mmol/L — ABNORMAL HIGH (ref 22–32)
Calcium: 8.9 mg/dL (ref 8.9–10.3)
Chloride: 94 mmol/L — ABNORMAL LOW (ref 98–111)
Creatinine, Ser: 1 mg/dL (ref 0.44–1.00)
GFR calc Af Amer: >60 mL/min (ref >60)
GFR calc non Af Amer: >60 mL/min (ref >60)
Glucose, Bld: 118 mg/dL — ABNORMAL HIGH (ref 70–99)
Potassium: 3.5 mmol/L (ref 3.5–5.1)
Sodium: 140 mmol/L (ref 135–145)

## 2019-01-14 LAB — PROTIME-INR
INR: 1.3 — ABNORMAL HIGH (ref 0.8–1.2)
Prothrombin Time: 16.2 seconds — ABNORMAL HIGH (ref 11.4–15.2)

## 2019-01-14 LAB — CBC
HCT: 34.2 % — ABNORMAL LOW (ref 36.0–46.0)
Hemoglobin: 11 g/dL — ABNORMAL LOW (ref 12.0–15.0)
MCH: 32.4 pg (ref 26.0–34.0)
MCHC: 32.2 g/dL (ref 30.0–36.0)
MCV: 100.6 fL — ABNORMAL HIGH (ref 80.0–100.0)
Platelets: 236 10*3/uL (ref 150–400)
RBC: 3.4 MIL/uL — ABNORMAL LOW (ref 3.87–5.11)
RDW: 14.5 % (ref 11.5–15.5)
WBC: 5.7 10*3/uL (ref 4.0–10.5)
nRBC: 0 % (ref 0.0–0.2)

## 2019-01-14 LAB — HEPARIN LEVEL (UNFRACTIONATED): Heparin Unfractionated: 0.39 IU/mL (ref 0.30–0.70)

## 2019-01-14 MED ORDER — FUROSEMIDE 10 MG/ML IJ SOLN
80.0000 mg | Freq: Two times a day (BID) | INTRAMUSCULAR | Status: DC
  Administered 2019-01-14 – 2019-01-16 (×5): 80 mg via INTRAVENOUS
  Filled 2019-01-14 (×5): qty 8

## 2019-01-14 MED ORDER — AQUAPHOR EX OINT
TOPICAL_OINTMENT | Freq: Every morning | CUTANEOUS | Status: DC
  Administered 2019-01-14 – 2019-01-17 (×4): via TOPICAL
  Administered 2019-01-19: 1 via TOPICAL
  Filled 2019-01-14 (×3): qty 50

## 2019-01-14 MED ORDER — POTASSIUM CHLORIDE CRYS ER 10 MEQ PO TBCR
10.0000 meq | EXTENDED_RELEASE_TABLET | Freq: Two times a day (BID) | ORAL | Status: DC
  Administered 2019-01-14 – 2019-01-19 (×11): 10 meq via ORAL
  Filled 2019-01-14 (×11): qty 1

## 2019-01-14 MED ORDER — WARFARIN SODIUM 7.5 MG PO TABS
7.5000 mg | ORAL_TABLET | Freq: Once | ORAL | Status: AC
  Administered 2019-01-14: 7.5 mg via ORAL
  Filled 2019-01-14: qty 1

## 2019-01-14 NOTE — Progress Notes (Signed)
Progress Note  Patient Name: Theresa [name] Date of Encounter: 01/14/2019  Primary Cardiologist: [name], MD   Subjective   Excellent UO and weight reduction (net 4L diuresis. 5.7 lb weight loss since admission). Rnal parameters remain stable, K borderline low. C/O neuropathic discomfort in extremities. Borderline hCG level with declining trend, is not indicative of pregnancy.  Inpatient Medications    Scheduled Meds: . famotidine  20 mg Oral Daily  . gabapentin  600 mg Oral QHS  . insulin aspart  0-15 Units Subcutaneous TID WC  . insulin aspart  0-5 Units Subcutaneous QHS  . insulin glargine  10 Units Subcutaneous QHS  . LORazepam  0.5 mg Oral BID  . losartan  25 mg Oral Daily  . metoprolol succinate  25 mg Oral Daily  . rosuvastatin  10 mg Oral q1800  . sertraline  50 mg Oral Daily  . sodium chloride flush  3 mL Intravenous Q12H  . tamsulosin  0.4 mg Oral Daily  . Warfarin - Pharmacist Dosing Inpatient   Does not apply q1800   Continuous Infusions: . sodium chloride    . heparin 1,450 Units/hr (01/13/19 2145)   PRN Meds: sodium chloride, acetaminophen, albuterol, ipratropium-albuterol, sodium chloride flush   Vital Signs    Vitals:   01/13/19 2304 01/14/19 0350 01/14/19 0449 01/14/19 0716  BP:   127/66   Pulse: 78 63 65 61  Resp: 18 18 20 16   Temp:   98 F (36.7 C)   TempSrc:   Oral   SpO2: 94% 93% 91% 91%  Weight:   95.3 kg     Intake/Output Summary (Last 24 hours) at 01/14/2019 0900 Last data filed at 01/14/2019 0600 Gross per 24 hour  Intake 725.83 ml  Output 2500 ml  Net -1774.17 ml   Last 3 Weights 01/14/2019 01/13/2019 01/12/2019  Weight (lbs) 210 lb 1.6 oz 215 lb 12.8 oz 216 lb  Weight (kg) 95.301 kg 97.886 kg 97.977 kg      Telemetry    NSR - Personally Reviewed  ECG    No new tracing - Personally Reviewed  Physical Exam  Looks better. Slight appearance of a malar rash? GEN: No acute distress.   Neck: No JVD Cardiac: RRR, no  murmurs, rubs, or gallops.  Respiratory: Clear to auscultation bilaterally. GI: Soft, nontender, mildly distended  MS: 1+ dependent edema edema; No deformity. Neuro:  Nonfocal  Psych: Normal affect   Labs    Chemistry Recent Labs  Lab 01/12/19 2009 01/12/19 2244 01/13/19 0224 01/14/19 0647  NA 140  --  139 140  K 3.7  --  4.3 3.5  CL 98  --  96* 94*  CO2 31  --  28 33*  GLUCOSE 238*  --  237* 118*  BUN 16  --  16 14  CREATININE 1.01*  --  0.97 1.00  CALCIUM 9.3  --  9.1 8.9  PROT  --  7.1  --   --   ALBUMIN  --  3.1*  --   --   AST  --  23  --   --   ALT  --  22  --   --   ALKPHOS  --  121  --   --   BILITOT  --  1.4*  --   --   GFRNONAA >60  --  >60 >60  GFRAA >60  --  >60 >60  ANIONGAP 11  --  15 13  Hematology Recent Labs  Lab 01/12/19 2009 01/14/19 0647  WBC 8.0 5.7  RBC 3.51* 3.40*  HGB 11.3* 11.0*  HCT 35.6* 34.2*  MCV 101.4* 100.6*  MCH 32.2 32.4  MCHC 31.7 32.2  RDW 14.6 14.5  PLT 275 236    Cardiac Enzymes Recent Labs  Lab 01/12/19 2244  TROPONINI <0.03   No results for input(s): TROPIPOC in the last 168 hours.   BNP Recent Labs  Lab 01/12/19 2009  BNP 834.6*     DDimer No results for input(s): DDIMER in the last 168 hours.   Radiology    Dg Chest Port 1 View  Result Date: 01/12/2019 CLINICAL DATA:  Shortness of Breath EXAM: PORTABLE CHEST 1 VIEW COMPARISON:  11/07/2018 FINDINGS: Tracheostomy tube is noted in satisfactory position. Gastric catheter is been removed in the interval. Cardiac shadow remains enlarged. Postsurgical changes are again noted. Vascular congestion with interstitial edema is noted. Postsurgical changes in the right lung base are seen. IMPRESSION: CHF with pulmonary edema. Electronically Signed   By: Inez Catalina M.D.   On: 01/12/2019 21:26    Cardiac Studies   ECHO 01/14/2019  1. Severe hypokinesis of the left ventricular, basal-mid inferoseptal wall, inferior wall and inferolateral wall.  2. The left  ventricle has mild-moderately reduced systolic function, with an ejection fraction of 40-45%. The cavity size was normal. Left ventricular diastolic Doppler parameters are consistent with pseudonormal. Elevated mean left atrial pressure.  3. The right ventricle has mildly reduced systolic function. The cavity was normal. There is no increase in right ventricular wall thickness. Right ventricular systolic pressure is moderately elevated with an estimated pressure of 58 mmHg.  4. Mitral valve regurgitation is mild to moderate by color flow Doppler. The MR jet is centrally-directed.  5. The inferior vena cava was dilated in size with >50% respiratory variability.  6. Left atrial size was moderately dilated.  7. Right atrial size was mildly dilated.  Right and left heart catheterization March 24, 2018  Severe native coronary artery disease with total occlusion of the proximal LAD, total occlusion of the proximal RCA, and patent circumflex with patent proximal stent with eccentric 50% proximal narrowing. Distal obtuse marginal branches are severely and diffusely diseased.  Bypass graft failure with total occlusion of SVG to RCA.  Patent LIMA to LAD.  Right coronary territory is supplied by collaterals from LAD and circumflex.  Left ventricular systolic dysfunction with EF 35 to 45%. Mid to distal inferior wall akinesis. Inferobasal and inferoapical wall severely hypokinetic. Left ventricular end-diastolic pressure elevated at 19 mmHg  Moderate to severe pulmonary hypertension  Diagnostic  Dominance: Right    Fick Cardiac Output 3.32 L/min  Fick Cardiac Output Index 1.84 (L/min)/BSA  RA A Wave 12 mmHg  RA V Wave 17 mmHg  RA Mean 11 mmHg  RV Systolic Pressure 70 mmHg  RV Diastolic Pressure 6 mmHg  RV EDP 15 mmHg  PA Systolic Pressure 66 mmHg  PA Diastolic Pressure 21 mmHg  PA Mean 40 mmHg  PW A Wave 20 mmHg  PW V Wave 12 mmHg  PW Mean 15 mmHg  AO Systolic Pressure 517 mmHg  AO  Diastolic Pressure 63 mmHg  AO Mean 83 mmHg  LV Systolic Pressure 616 mmHg  LV Diastolic Pressure 11 mmHg  LV EDP 19 mmHg  AOp Systolic Pressure 073 mmHg  AOp Diastolic Pressure 63 mmHg  AOp Mean Pressure 82 mmHg  LVp Systolic Pressure 710 mmHg  LVp Diastolic Pressure 9 mmHg  LVp EDP Pressure 19 mmHg  QP/QS 1  TPVR Index 21.71 HRUI  TSVR Index 45.06 HRUI  PVR SVR Ratio 0.35  TPVR/TSVR Ratio 0.48     Patient Profile     53 y.o. female admitted with acute on chronic combined systolic and diastolic biventricular heart failure, premature onset CAD (CABG 2007, DES to SVG-PDA 2017, now with known total occlusion of the SVG-PDA as well as total occlusion of the native RCA, total occlusion of the native LAD with patent LIMA to LAD, 50% stenosis in previously stented left circumflex coronary artery with high-grade stenoses and oblique marginal arteries, diffusely diseased),, chronic respiratory insufficiency and tracheostomy since 2002, COPD, hypertension, hyperlipidemia, moderate to severe pulmonary artery hypertension, history of DVT and lupus anticoagulant on chronic warfarin therapy, currently subtherapeutic.  Assessment & Plan    1. CHF with reduced systolic function: Current echo with similar findings to LV angiogram.  Good response to IV diuretics.  Continue until we reach target dry weight: roughly 192- 197 pounds, per the patient's report.  Note however that at the time of her right and left heart catheterization in July 2019 when her wedge pressure was 15 mmHg she only weighed 173 pounds.  Based on review of systems, sodium dietary indiscretion is most likely cause for current episode of decompensation.  Restarted ARB on this admission and will gradually titrate the dose upwards.  Consider transition to [facility], although EF borderline for this indication. 2. PAH: Moderate to severe pulmonary artery hypertension.  Right heart catheterization results from last year demonstrate that this is  primarily due to to intrinsic pulmonary arteriolar obstruction, rather than left heart failure (transpulmonary gradient 25 mmHg).  Chronic hypoventilation, COPD, possibly chronic venous thromboembolic events are playing a bigger role in the pulmonary hypertension than left heart failure.  Suspect that some degree of right heart overload and peripheral edema may still be present when we achieve optimal left heart filling pressures. 3. CAD:  No angina.  Severe native and graft vessel disease, primarily receives myocardial blood flow through the LIMA to LAD bypass surgery and severely diseased left circumflex coronary system.  Per previous notes she does have stable angina pectoris, CCS functional class II-3 (angina when walking around the house, resolved spontaneously without sublingual nitroglycerin).  Low suspicion for acute coronary insufficiency as a cause of the current gradual decompensation. 4.  Chronic respiratory insufficiency with hypoxia: on home O2, usually 3-4 L/minute, chronic tracheostomy, history of COPD.  Previous ABGs suggest mild chronic hypercapnia/CO2 retaining with typical PCO2 45-50.  Note history of PEA cardiac arrest a few months ago when she had severe hypoxemia in the setting of a dislodged tracheostomy. 5.  History of DVT/lupus anticoagulant/chronic anticoagulation: Barring serious bleeding complications this should be continued indefinitely.  Subtherapeutic INR on admission.  Currently on intravenous heparin, reloading warfarin. 6. DM on insulin: Historically poorly controlled, but most recent hemoglobin A1c is 6.3%. 7. HLP: Most recent LDL cholesterol was 103, target less than 70.  Per notes she did not tolerate high-dose statins but was taking rosuvastatin 10 mg daily.  Restart this. 8. HTN: Target BP less than 130/80.  Start ARB. 9. +hCG: minimal increase, trending down and now borderline elevated at 5, not compatible with pregnancy. Patient denies intercourse in > 1 year.   Suspect cross-reaction false positive.   For questions or updates, please contact [place] Please consult [email or web address] for contact info under        Signed, Sanda Klein, MD  01/14/2019, 9:00  AM

## 2019-01-14 NOTE — Progress Notes (Signed)
ANTICOAGULATION CONSULT NOTE - Initial Consult  Pharmacy Consult for Heparin/Coumadin Indication: h/o DVT  Allergies  Allergen Reactions  . Penicillins Rash    Has patient had a PCN reaction causing immediate rash, facial/tongue/throat swelling, SOB or lightheadedness with hypotension: Yes Has patient had a PCN reaction causing severe rash involving mucus membranes or skin necrosis: No Has patient had a PCN reaction that required hospitalization No Has patient had a PCN reaction occurring within the last 10 years: No If all of the above answers are "NO", then may proceed with Cephalosporin use.   REACTION: rash  . Ciprofloxacin     nausea  . Ibuprofen Rash    swelling in leg     Patient Measurements: Weight: 210 lb 1.6 oz (95.3 kg)(scale a) Heparin Dosing Weight: 65.2 kg  Vital Signs: Temp: 98 F (36.7 C) (05/10 0449) Temp Source: Oral (05/10 0449) BP: 127/66 (05/10 0449) Pulse Rate: 61 (05/10 0716)  Labs: Recent Labs    01/12/19 2009 01/12/19 2244 01/13/19 0224 01/13/19 1219 01/13/19 2026 01/14/19 0647  HGB 11.3*  --   --   --   --  11.0*  HCT 35.6*  --   --   --   --  34.2*  PLT 275  --   --   --   --  236  LABPROT  --  15.8*  --  15.3*  --  16.2*  INR  --  1.3*  --  1.2  --  1.3*  HEPARINUNFRC  --   --   --  0.21* 0.29* 0.39  CREATININE 1.01*  --  0.97  --   --  1.00  TROPONINI  --  <0.03  --   --   --   --     Estimated Creatinine Clearance: 64.4 mL/min (by C-G formula based on SCr of 1 mg/dL).   Medical History: Past Medical History:  Diagnosis Date  . Acute kidney failure with lesion of tubular necrosis (HCC)   . Acute systolic heart failure ([place])   . CAD (coronary artery disease)    a. s/p prior PCI. b. CABG 2007 at [facility] in [place] 2007. c. inferior STEMI 10/2015 s/p DES to dSVG-PDA.  . Cardiac arrest ([place])   . Cervical cancer ([place])   . Chronic diastolic CHF (congestive heart failure) ([place])   . Chronic respiratory failure ([place])     s/p tracheostomy 2002  . Chronic RUQ pain   . COPD (chronic obstructive pulmonary disease) ([place])   . Diabetes mellitus ([place])   . DVT (deep venous thrombosis) ([place])   . Endometriosis   . History of gallstones 01/2016   seen on Ultrasound  . History of HIDA scan 11/2016   normal  . HTN (hypertension)   . Hyperlipidemia   . Lupus anticoagulant disorder (HCC)    on coumadin  . Morbid obesity ([place])   . ST elevation (STEMI) myocardial infarction involving right coronary artery ([place]) 10/29/15   stent to VG to PDA  . Toe fracture, right 03/29/2018  . Tracheostomy in place [facility]), chronic since 2002 11/03/2015  . Tracheostomy status (HCC)     Medications:  No current facility-administered medications on file prior to encounter.    Current Outpatient Medications on File Prior to Encounter  Medication Sig Dispense Refill  . albuterol (PROVENTIL) (2.5 MG/3ML) 0.083% nebulizer solution Take 3 mLs (2.5 mg total) by nebulization 2 (two) times daily. (Patient taking differently: Take 2.5 mg by nebulization every 4 (four) hours  as needed for wheezing. ) 75 mL 12  . famotidine (PEPCID) 20 MG tablet Take 20 mg by mouth daily.    Marland Kitchen gabapentin (NEURONTIN) 300 MG capsule Take 600 mg by mouth at bedtime.     . Insulin Degludec (TRESIBA FLEXTOUCH [place]) Inject 30 Units into the skin every morning.    Marland Kitchen LORazepam (ATIVAN) 0.5 MG tablet Take 0.5 mg by mouth every 6 (six) hours as needed for anxiety.     . metoprolol succinate (TOPROL-XL) 25 MG 24 hr tablet Take 25 mg by mouth daily.    . nitroGLYCERIN (NITROSTAT) 0.4 MG SL tablet Place 0.4 mg under the tongue every 5 (five) minutes as needed for chest pain.    Marland Kitchen sertraline (ZOLOFT) 50 MG tablet Take 50 mg by mouth daily.    . tamsulosin (FLOMAX) 0.4 MG CAPS capsule Take 0.4 mg by mouth daily.    Marland Kitchen torsemide (DEMADEX) 20 MG tablet Take 20-40 mg by mouth 2 (two) times daily. Take 2 in the morning and 1 tablet in the evening    . warfarin (COUMADIN) 5 MG tablet  Take 1 tablet (5 mg total) by mouth one time only at 6 PM. (Patient taking differently: Take 2.5-5 mg by mouth daily. Take 5 mg everyday except Sunday take 2.5 mg)      Assessment: 53 y.o. female admitted with CHF exacerbation, h/o DVT/lupus anticoagulant and subtherapeutic INR, for heparin.   INR subtherapeutic at 1.3. Patient states her blood was "thick" at her last INR visit. Denies missed doses in the past week, denies changes in diet. Warfarin dose held 5/9 due to positive HCG. Patient denies sexual activity and was ruled as false positive. Ok to continue warfarin.  Anti-Xa level therapeutic at 0.39. No bleeding noted. Hemoglobin 11, platelets 236.    Confirms her home dose is 5mg  daily except 2.5mg  on Sunday.   Goal of Therapy:  INR 2-3 Heparin level 0.3-0.7 Monitor platelets by anticoagulation protocol: Yes   Plan:  Continue heparin at 1450 units/hr Warfarin 7.5 mg   Daily INR, CBC, and heparin level Monitor for signs/symptoms of bleeding   Azzie Roup D PGY1 Pharmacy Resident  Phone [phone] Please use AMION for clinical pharmacists numbers  01/14/2019      9:51 AM

## 2019-01-14 NOTE — Plan of Care (Signed)
  Problem: Education: Goal: Ability to demonstrate management of disease process will improve Outcome: Progressing   Problem: Activity: Goal: Capacity to carry out activities will improve Outcome: Progressing   Problem: Cardiac: Goal: Ability to achieve and maintain adequate cardiopulmonary perfusion will improve Outcome: Progressing   Problem: Activity: Goal: Risk for activity intolerance will decrease Outcome: Progressing   Problem: Pain Managment: Goal: General experience of comfort will improve Outcome: Progressing   Problem: Skin Integrity: Goal: Risk for impaired skin integrity will decrease Outcome: Progressing

## 2019-01-15 ENCOUNTER — Other Ambulatory Visit: Payer: Self-pay

## 2019-01-15 ENCOUNTER — Encounter (HOSPITAL_COMMUNITY): Payer: Self-pay

## 2019-01-15 DIAGNOSIS — I25118 Atherosclerotic heart disease of native coronary artery with other forms of angina pectoris: Secondary | ICD-10-CM

## 2019-01-15 DIAGNOSIS — I272 Pulmonary hypertension, unspecified: Secondary | ICD-10-CM

## 2019-01-15 DIAGNOSIS — I2510 Atherosclerotic heart disease of native coronary artery without angina pectoris: Secondary | ICD-10-CM

## 2019-01-15 DIAGNOSIS — E782 Mixed hyperlipidemia: Secondary | ICD-10-CM

## 2019-01-15 LAB — CBC
HCT: 35 % — ABNORMAL LOW (ref 36.0–46.0)
Hemoglobin: 11.4 g/dL — ABNORMAL LOW (ref 12.0–15.0)
MCH: 32.9 pg (ref 26.0–34.0)
MCHC: 32.6 g/dL (ref 30.0–36.0)
MCV: 100.9 fL — ABNORMAL HIGH (ref 80.0–100.0)
Platelets: 210 10*3/uL (ref 150–400)
RBC: 3.47 MIL/uL — ABNORMAL LOW (ref 3.87–5.11)
RDW: 14.5 % (ref 11.5–15.5)
WBC: 6.4 10*3/uL (ref 4.0–10.5)
nRBC: 0 % (ref 0.0–0.2)

## 2019-01-15 LAB — POTASSIUM: Potassium: 3.9 mmol/L (ref 3.5–5.1)

## 2019-01-15 LAB — MAGNESIUM: Magnesium: 1.9 mg/dL (ref 1.7–2.4)

## 2019-01-15 LAB — BASIC METABOLIC PANEL
Anion gap: 10 (ref 5–15)
BUN: 16 mg/dL (ref 6–20)
CO2: 29 mmol/L (ref 22–32)
Calcium: 9.4 mg/dL (ref 8.9–10.3)
Chloride: 98 mmol/L (ref 98–111)
Creatinine, Ser: 1.02 mg/dL — ABNORMAL HIGH (ref 0.44–1.00)
GFR calc Af Amer: >60 mL/min (ref >60)
GFR calc non Af Amer: >60 mL/min (ref >60)
Glucose, Bld: 142 mg/dL — ABNORMAL HIGH (ref 70–99)
Potassium: 3.8 mmol/L (ref 3.5–5.1)
Sodium: 137 mmol/L (ref 135–145)

## 2019-01-15 LAB — GLUCOSE, CAPILLARY
Glucose-Capillary: 149 mg/dL — ABNORMAL HIGH (ref 70–99)
Glucose-Capillary: 180 mg/dL — ABNORMAL HIGH (ref 70–99)
Glucose-Capillary: 154 mg/dL — ABNORMAL HIGH (ref 70–99)
Glucose-Capillary: 121 mg/dL — ABNORMAL HIGH (ref 70–99)

## 2019-01-15 LAB — PROTIME-INR
INR: 1.3 — ABNORMAL HIGH (ref 0.8–1.2)
Prothrombin Time: 16.1 seconds — ABNORMAL HIGH (ref 11.4–15.2)

## 2019-01-15 LAB — HEPARIN LEVEL (UNFRACTIONATED): Heparin Unfractionated: 0.4 IU/mL (ref 0.30–0.70)

## 2019-01-15 MED ORDER — WARFARIN SODIUM 7.5 MG PO TABS
7.5000 mg | ORAL_TABLET | Freq: Once | ORAL | Status: AC
  Administered 2019-01-15: 7.5 mg via ORAL
  Filled 2019-01-15: qty 1

## 2019-01-15 MED ORDER — TRAMADOL HCL 50 MG PO TABS
50.0000 mg | ORAL_TABLET | Freq: Two times a day (BID) | ORAL | Status: AC | PRN
  Administered 2019-01-15 – 2019-01-16 (×2): 50 mg via ORAL
  Filled 2019-01-15 (×2): qty 1

## 2019-01-15 NOTE — Progress Notes (Signed)
Progress Note  Patient Name: Theresa [name] Date of Encounter: 01/15/2019  Primary Cardiologist: [name], MD   Subjective   Feeling better, less swelling in her feet. Still swollen, though improved, in her abdomen. Making good urine. Spent time today reviewing results of her echo, how it related to her prior cath, right vs. Left heart, etc. All questions answered.  Inpatient Medications    Scheduled Meds: . famotidine  20 mg Oral Daily  . furosemide  80 mg Intravenous Q12H  . gabapentin  600 mg Oral QHS  . insulin aspart  0-15 Units Subcutaneous TID WC  . insulin aspart  0-5 Units Subcutaneous QHS  . insulin glargine  10 Units Subcutaneous QHS  . LORazepam  0.5 mg Oral BID  . losartan  25 mg Oral Daily  . metoprolol succinate  25 mg Oral Daily  . mineral oil-hydrophilic petrolatum   Topical q morning - 10a  . potassium chloride  10 mEq Oral BID  . rosuvastatin  10 mg Oral q1800  . sertraline  50 mg Oral Daily  . sodium chloride flush  3 mL Intravenous Q12H  . tamsulosin  0.4 mg Oral Daily  . warfarin  7.5 mg Oral ONCE-1800  . Warfarin - Pharmacist Dosing Inpatient   Does not apply q1800   Continuous Infusions: . sodium chloride    . heparin 1,450 Units/hr (01/14/19 1530)   PRN Meds: sodium chloride, acetaminophen, albuterol, ipratropium-albuterol, sodium chloride flush   Vital Signs    Vitals:   01/15/19 0000 01/15/19 0417 01/15/19 0600 01/15/19 0806  BP:   (!) 149/72   Pulse: 68 62 67 70  Resp: 20 18 18 18   Temp:   98 F (36.7 C)   TempSrc:   Oral   SpO2: 94% 92% 95% 91%  Weight:   92.7 kg   Height:        Intake/Output Summary (Last 24 hours) at 01/15/2019 0903 Last data filed at 01/15/2019 0557 Gross per 24 hour  Intake 1425.23 ml  Output 4200 ml  Net -2774.77 ml   Last 3 Weights 01/15/2019 01/14/2019 01/14/2019  Weight (lbs) 204 lb 5.9 oz 210 lb 1.6 oz 210 lb 1.6 oz  Weight (kg) 92.7 kg 95.301 kg 95.3 kg      Telemetry    NSR - Personally  Reviewed  ECG    No new tracing since 5/8- Personally Reviewed  Physical Exam   GEN: No acute distress.  Malar erythema, diffuse psoriatic skin lesions Neck: Difficult to assess JVD given body habitus and trach collar. Cardiac: RRR, no murmurs, rubs, or gallops.  Respiratory: Clear in bilateral upper fields, fine rales at bilateral bases GI: Soft, nontender, mildly distended  MS: trace bilateral LE dependent edema Neuro:  Nonfocal  Psych: Normal affect   Labs    Chemistry Recent Labs  Lab 01/12/19 2244 01/13/19 0224 01/14/19 0647 01/15/19 0645  NA  --  139 140 137  K  --  4.3 3.5 3.8  CL  --  96* 94* 98  CO2  --  28 33* 29  GLUCOSE  --  237* 118* 142*  BUN  --  16 14 16   CREATININE  --  0.97 1.00 1.02*  CALCIUM  --  9.1 8.9 9.4  PROT 7.1  --   --   --   ALBUMIN 3.1*  --   --   --   AST 23  --   --   --   ALT  22  --   --   --   ALKPHOS 121  --   --   --   BILITOT 1.4*  --   --   --   GFRNONAA  --  >60 >60 >60  GFRAA  --  >60 >60 >60  ANIONGAP  --  15 13 10      Hematology Recent Labs  Lab 01/12/19 2009 01/14/19 0647 01/15/19 0645  WBC 8.0 5.7 6.4  RBC 3.51* 3.40* 3.47*  HGB 11.3* 11.0* 11.4*  HCT 35.6* 34.2* 35.0*  MCV 101.4* 100.6* 100.9*  MCH 32.2 32.4 32.9  MCHC 31.7 32.2 32.6  RDW 14.6 14.5 14.5  PLT 275 236 210    Cardiac Enzymes Recent Labs  Lab 01/12/19 2244  TROPONINI <0.03   No results for input(s): TROPIPOC in the last 168 hours.   BNP Recent Labs  Lab 01/12/19 2009  BNP 834.6*     DDimer No results for input(s): DDIMER in the last 168 hours.   Radiology    No results found.  Cardiac Studies   ECHO 01/14/2019  1. Severe hypokinesis of the left ventricular, basal-mid inferoseptal wall, inferior wall and inferolateral wall.  2. The left ventricle has mild-moderately reduced systolic function, with an ejection fraction of 40-45%. The cavity size was normal. Left ventricular diastolic Doppler parameters are consistent with  pseudonormal. Elevated mean left atrial pressure.  3. The right ventricle has mildly reduced systolic function. The cavity was normal. There is no increase in right ventricular wall thickness. Right ventricular systolic pressure is moderately elevated with an estimated pressure of 58 mmHg.  4. Mitral valve regurgitation is mild to moderate by color flow Doppler. The MR jet is centrally-directed.  5. The inferior vena cava was dilated in size with >50% respiratory variability.  6. Left atrial size was moderately dilated.  7. Right atrial size was mildly dilated.  Right and left heart catheterization March 24, 2018  Severe native coronary artery disease with total occlusion of the proximal LAD, total occlusion of the proximal RCA, and patent circumflex with patent proximal stent with eccentric 50% proximal narrowing. Distal obtuse marginal branches are severely and diffusely diseased.  Bypass graft failure with total occlusion of SVG to RCA.  Patent LIMA to LAD.  Right coronary territory is supplied by collaterals from LAD and circumflex.  Left ventricular systolic dysfunction with EF 35 to 45%. Mid to distal inferior wall akinesis. Inferobasal and inferoapical wall severely hypokinetic. Left ventricular end-diastolic pressure elevated at 19 mmHg  Moderate to severe pulmonary hypertension  Diagnostic  Dominance: Right    Fick Cardiac Output 3.32 L/min  Fick Cardiac Output Index 1.84 (L/min)/BSA  RA A Wave 12 mmHg  RA V Wave 17 mmHg  RA Mean 11 mmHg  RV Systolic Pressure 70 mmHg  RV Diastolic Pressure 6 mmHg  RV EDP 15 mmHg  PA Systolic Pressure 66 mmHg  PA Diastolic Pressure 21 mmHg  PA Mean 40 mmHg  PW A Wave 20 mmHg  PW V Wave 12 mmHg  PW Mean 15 mmHg  AO Systolic Pressure 638 mmHg  AO Diastolic Pressure 63 mmHg  AO Mean 83 mmHg  LV Systolic Pressure 937 mmHg  LV Diastolic Pressure 11 mmHg  LV EDP 19 mmHg  AOp Systolic Pressure 342 mmHg  AOp Diastolic Pressure 63 mmHg   AOp Mean Pressure 82 mmHg  LVp Systolic Pressure 876 mmHg  LVp Diastolic Pressure 9 mmHg  LVp EDP Pressure 19 mmHg  QP/QS 1  TPVR Index 21.71 HRUI  TSVR Index 45.06 HRUI  PVR SVR Ratio 0.35  TPVR/TSVR Ratio 0.48     Patient Profile     53 y.o. female admitted with acute on chronic combined systolic and diastolic biventricular heart failure, premature onset CAD (CABG 2007, DES to SVG-PDA 2017, now with known total occlusion of the SVG-PDA as well as total occlusion of the native RCA, total occlusion of the native LAD with patent LIMA to LAD, 50% stenosis in previously stented left circumflex coronary artery with high-grade stenoses and oblique marginal arteries, diffusely diseased), chronic respiratory insufficiency and tracheostomy since 2002, COPD, hypertension, hyperlipidemia, moderate to severe pulmonary artery hypertension, history of DVT and lupus anticoagulant on chronic warfarin therapy, subtherapeutic on admission.  Assessment & Plan    1. CHF with reduced systolic and diastolic function:  -net negative 6.8 L, weight today 204 lbs (92.7 kg today, from 97.9 kg on admission). Based on prior dry weight (192-197 lbs per patient, 173 lbs on prior cath), still with significant volume on board. -she is not sure why her weight went up. No clear changes in meds, denies changes in diet -was on metoprolol succinate 25 mg daily as an outpatient, continue -started on losartan 25 mg yesterday. EF is 40-45% on recent echo.  -continue IV lasix 80 mg BID -renal function stable -monitoring potassium, receiving 10 mEq BID given ongoing diuresis -spent >35 minutes reviewing echo and cath results with her. We discussed her CTO right circulation (both native and graft) and what we see in terms of wall motion on her echo. We also discussed how we manage that medically. Discussed right heart as well and how lund disease affects pressures on the right side of the heart. She asked good questions, all  answered.  2. PAH: Moderate to severe pulmonary artery hypertension.   -prior RHC with elevated transpulmonary gradient (58mmHg), numbers suggest intrinsic lund disease and not LV dysfunction as primary etiology. -given chronic PAH, her baseline may have chronic edema due to RV overload. -echo this admission shows elevated LAP and RVSP of 58 mmHg  3. CAD s/p CABG 2007:  No angina.   -known severe native and graft vessel disease, primarily receives myocardial blood flow through the LIMA to LAD bypass surgery and severely diseased left circumflex coronary system. -known native RCA and SVG-RCA are occluded -prior LVgram and current echo show severe hypokinesis of inferior/inferoseptal/inferolateral walls. - Per previous notes she does have stable angina pectoris, CCS functional class II-3 (angina when walking around the house, resolved spontaneously without sublingual nitroglycerin).    4.  Chronic respiratory insufficiency with hypoxia: on home O2, usually 3-4 L/minute, chronic tracheostomy, history of COPD.   -Previous ABGs suggest mild chronic hypercapnia/CO2 retaining with typical PCO2 45-50.  -Note history of PEA cardiac arrest a few months ago when she had severe hypoxemia in the setting of a dislodged tracheostomy.  5.  History of DVT/lupus anticoagulant/chronic anticoagulation: Currently on intravenous heparin for subtherapeutic INR, reloading warfarin.  6. DM on insulin: Historically poorly controlled, but most recent hemoglobin A1c is 6.3%.  7. HLP: Most recent LDL cholesterol was 103, target less than 70.  Per notes she did not tolerate high-dose statins but was taking rosuvastatin 10 mg daily.  Restarted this admission.  8. HTN: Target BP less than 130/80.  Started ARB this admission.   9. +hCG: minimal increase, trending down and now borderline elevated at 5, not compatible with pregnancy. Patient denies intercourse in > 1 year.  Also  has history of tubal ligation. Suspect  cross-reaction false positive.   TIME SPENT WITH PATIENT: >35 minutes of direct patient care. More than 50% of that time was spent on coordination of care and counseling regarding echo, cath results, how we medically manage this.  Buford Dresser, MD, PhD [facility]  For questions or updates, please contact [place] Please consult [email or web address] for contact info under     Signed, Buford Dresser, MD  01/15/2019, 9:03 AM

## 2019-01-15 NOTE — Plan of Care (Signed)

## 2019-01-15 NOTE — Progress Notes (Addendum)
Notified by nurse that patient c/o pain with leg swelling without acute change in appearance of legs. BP stable in 980'I systolic now. Does not typically take pain medication, wanting something stronger than Tylenol. Already on anticoagulation. Will try tramadol. If pain persists may need consideration of eval for gout in AM given ongoing diuresis. Will also send K and Mg in case repletion is warranted, with care order instructions to page with parameters. Harshan Kearley PA-C

## 2019-01-15 NOTE — Progress Notes (Signed)
[place] for Heparin/Coumadin Indication: h/o DVT  Patient Measurements: Height: 4\' 10"  (147.3 cm) Weight: 204 lb 5.9 oz (92.7 kg) IBW/kg (Calculated) : 40.9 Heparin Dosing Weight: 65.2 kg  Vital Signs: Temp: 98 F (36.7 C) (05/11 0600) Temp Source: Oral (05/11 0600) BP: 149/72 (05/11 0600) Pulse Rate: 70 (05/11 0806)  Labs: Recent Labs    01/12/19 2009  01/12/19 2244 01/13/19 0224  01/13/19 1219 01/13/19 2026 01/14/19 0647 01/15/19 0645  HGB 11.3*  --   --   --   --   --   --  11.0* 11.4*  HCT 35.6*  --   --   --   --   --   --  34.2* 35.0*  PLT 275  --   --   --   --   --   --  236 210  LABPROT  --    < > 15.8*  --   --  15.3*  --  16.2* 16.1*  INR  --    < > 1.3*  --   --  1.2  --  1.3* 1.3*  HEPARINUNFRC  --   --   --   --    < > 0.21* 0.29* 0.39 0.40  CREATININE 1.01*  --   --  0.97  --   --   --  1.00 1.02*  TROPONINI  --   --  <0.03  --   --   --   --   --   --    < > = values in this interval not displayed.     Assessment: 53 y.o. female admitted with CHF exacerbation, h/o DVT/lupus anticoagulant and subtherapeutic INR, for heparin.   INR subtherapeutic at 1.3. Warfarin dose held 5/9 due to positive HCG, later determined to be a false positive. Patient remains on heparin drip as bridge to therapeutic INR.  Confirms her home dose is 5mg  daily except 2.5mg  on Sunday.   Goal of Therapy:  INR 2-3 Heparin level 0.3-0.7 Monitor platelets by anticoagulation protocol: Yes   Plan:  Continue heparin at 1450 units/hr Warfarin 7.5 mg   Daily INR, CBC, and heparin level    Harvel Quale 01/15/2019 8:21 AM

## 2019-01-15 NOTE — TOC Initial Note (Signed)
Transition of Care [facility]) - Initial/Assessment Note    Patient Details  Name: Theresa [name] MRN: [id number] Date of Birth: 05-Nov-1965  [facility] CM/SW Contact:    Sherrilyn Rist Phone Number: [phone] 01/15/2019, 2:03 PM  Clinical Narrative:                 Patient recently discharge from [place] home with [facility] services provided by [facility] ( [facility]). Primary Care Provider: Monico Blitz, MD; has private insurance with Medicare; trach from previous hosp stay - PEA arrest; has home oxygen; CM following for progression of care.  Expected Discharge Plan: [place] Barriers to Discharge: No Barriers Identified   Patient Goals and CMS Choice Patient states their goals for this hospitalization and ongoing recovery are:: to go home CMS Medicare.gov Compare Post Acute Care list provided to:: Patient    Expected Discharge Plan and Services Expected Discharge Plan: [place] In-house Referral: [facility] Discharge Planning Services: CM Consult Post Acute Care Choice: [place] arrangements for the past 2 months: Single Family Home                                      Prior Living Arrangements/Services Living arrangements for the past 2 months: Single Family Home Lives with:: Spouse          Need for Family Participation in Patient Care: Yes (Comment) Care giver support system in place?: Yes (comment)   Criminal Activity/Legal Involvement Pertinent to Current Situation/Hospitalization: No - Comment as needed  Activities of Daily Living Home Assistive Devices/Equipment: Vent/Trach supplies ADL Screening (condition at time of admission) Patient's cognitive ability adequate to safely complete daily activities?: Yes Is the patient deaf or have difficulty hearing?: No Does the patient have difficulty seeing, even when wearing glasses/contacts?: No Does the patient have difficulty  concentrating, remembering, or making decisions?: No Patient able to express need for assistance with ADLs?: Yes Does the patient have difficulty dressing or bathing?: No Independently performs ADLs?: Yes (appropriate for developmental age) Does the patient have difficulty walking or climbing stairs?: No Weakness of Legs: None Weakness of Arms/Hands: None  Permission Sought/Granted                  Emotional Assessment         Alcohol / Substance Use: Not Applicable Psych Involvement: No (comment)  Admission diagnosis:  Shortness of breath [R06.02] Acute on chronic congestive heart failure, unspecified heart failure type [facility]) [I50.9] Patient Active Problem List   Diagnosis Date Noted  . CAD (coronary artery disease) 01/15/2019  . Educated About Covid-19 Virus Infection 01/12/2019  . Acute on chronic combined systolic and diastolic CHF (congestive heart failure) ([place]) 01/12/2019  . Acute on chronic respiratory failure with hypoxia ([place]) 10/24/2018  . Pulmonary hypertension, unspecified ([place])   . Acute on chronic respiratory failure with hypoxia ([place]) 08/01/2017  . Uncontrolled type 2 diabetes mellitus with hyperglycemia, with long-term current use of insulin ([place]) 08/01/2017  . Dyspnea 07/31/2017  . Type 2 diabetes mellitus with hyperglycemia ([place]) 01/22/2016  . CAD (coronary artery disease) of artery bypass graft: PTCA/DES to distal body VG to PDA 10/29/15 11/03/2015  . Tracheostomy in place [facility]), chronic since 2002 11/03/2015  . Hypokalemia 11/03/2015  . Lupus anticoagulant syndrome ([place])   . Chronic diastolic CHF (congestive heart failure) ([place])   .  Respiratory failure ([place])   . Coronary artery disease involving coronary bypass graft of native heart without angina pectoris   . OSA (obstructive sleep apnea)   . COPD exacerbation ([place])   . Tobacco abuse   . DM (diabetes mellitus) ([place]) 06/11/2009  . Hyperlipidemia LDL goal <70 06/11/2009  . DVT 06/11/2009  .  CORONARY ARTERY BYPASS GRAFT, HX OF 06/11/2009  . Essential hypertension 05/27/2009  . CAD, NATIVE VESSEL 05/27/2009   PCP:  Monico Blitz, MD Pharmacy:   [facility] [address], [place] [address] Phone: [phone] Fax: [phone]     Social Determinants of Health (SDOH) Interventions    Readmission Risk Interventions No flowsheet data found.

## 2019-01-16 ENCOUNTER — Encounter (HOSPITAL_COMMUNITY): Payer: Self-pay | Admitting: *Deleted

## 2019-01-16 DIAGNOSIS — G5793 Unspecified mononeuropathy of bilateral lower limbs: Secondary | ICD-10-CM

## 2019-01-16 LAB — BASIC METABOLIC PANEL
Anion gap: 11 (ref 5–15)
BUN: 20 mg/dL (ref 6–20)
CO2: 31 mmol/L (ref 22–32)
Calcium: 9.3 mg/dL (ref 8.9–10.3)
Chloride: 95 mmol/L — ABNORMAL LOW (ref 98–111)
Creatinine, Ser: 1.2 mg/dL — ABNORMAL HIGH (ref 0.44–1.00)
GFR calc Af Amer: 60 mL/min — ABNORMAL LOW (ref >60)
GFR calc non Af Amer: 52 mL/min — ABNORMAL LOW (ref >60)
Glucose, Bld: 171 mg/dL — ABNORMAL HIGH (ref 70–99)
Potassium: 3.8 mmol/L (ref 3.5–5.1)
Sodium: 137 mmol/L (ref 135–145)

## 2019-01-16 LAB — GLUCOSE, CAPILLARY
Glucose-Capillary: 182 mg/dL — ABNORMAL HIGH (ref 70–99)
Glucose-Capillary: 178 mg/dL — ABNORMAL HIGH (ref 70–99)
Glucose-Capillary: 157 mg/dL — ABNORMAL HIGH (ref 70–99)
Glucose-Capillary: 158 mg/dL — ABNORMAL HIGH (ref 70–99)

## 2019-01-16 LAB — CBC
HCT: 35.8 % — ABNORMAL LOW (ref 36.0–46.0)
Hemoglobin: 11.6 g/dL — ABNORMAL LOW (ref 12.0–15.0)
MCH: 32.4 pg (ref 26.0–34.0)
MCHC: 32.4 g/dL (ref 30.0–36.0)
MCV: 100 fL (ref 80.0–100.0)
Platelets: 241 10*3/uL (ref 150–400)
RBC: 3.58 MIL/uL — ABNORMAL LOW (ref 3.87–5.11)
RDW: 14.4 % (ref 11.5–15.5)
WBC: 6.8 10*3/uL (ref 4.0–10.5)
nRBC: 0 % (ref 0.0–0.2)

## 2019-01-16 LAB — PROTIME-INR
INR: 1.5 — ABNORMAL HIGH (ref 0.8–1.2)
Prothrombin Time: 18 seconds — ABNORMAL HIGH (ref 11.4–15.2)

## 2019-01-16 LAB — HEPARIN LEVEL (UNFRACTIONATED): Heparin Unfractionated: 0.44 IU/mL (ref 0.30–0.70)

## 2019-01-16 MED ORDER — GABAPENTIN 300 MG PO CAPS
300.0000 mg | ORAL_CAPSULE | Freq: Two times a day (BID) | ORAL | Status: DC
  Administered 2019-01-16 – 2019-01-19 (×7): 300 mg via ORAL
  Filled 2019-01-16 (×7): qty 1

## 2019-01-16 MED ORDER — WARFARIN SODIUM 7.5 MG PO TABS
7.5000 mg | ORAL_TABLET | Freq: Once | ORAL | Status: AC
  Administered 2019-01-16: 7.5 mg via ORAL
  Filled 2019-01-16: qty 1

## 2019-01-16 MED ORDER — FUROSEMIDE 10 MG/ML IJ SOLN
80.0000 mg | Freq: Every day | INTRAMUSCULAR | Status: DC
  Administered 2019-01-17: 80 mg via INTRAVENOUS
  Filled 2019-01-16: qty 8

## 2019-01-16 NOTE — Progress Notes (Signed)
RT note-Simple spot check this round. PT working with patient, walking in room.

## 2019-01-16 NOTE — Progress Notes (Signed)
ANTICOAGULATION CONSULT NOTE - Follow Up Consult  Pharmacy Consult for Heparin/Coumadin Indication: DVT/Lupus anticoagulant   Allergies  Allergen Reactions  . Penicillins Rash    Has patient had a PCN reaction causing immediate rash, facial/tongue/throat swelling, SOB or lightheadedness with hypotension: Yes Has patient had a PCN reaction causing severe rash involving mucus membranes or skin necrosis: No Has patient had a PCN reaction that required hospitalization No Has patient had a PCN reaction occurring within the last 10 years: No If all of the above answers are "NO", then may proceed with Cephalosporin use.   REACTION: rash  . Ciprofloxacin     nausea  . Ibuprofen Rash    swelling in leg     Patient Measurements: Height: 4\' 10"  (147.3 cm) Weight: 204 lb 5.9 oz (92.7 kg) IBW/kg (Calculated) : 40.9 Heparin Dosing Weight: 65.2 kg  Vital Signs: Temp: 98 F (36.7 C) (05/12 0352) Temp Source: Oral (05/12 0352) BP: 122/67 (05/12 0352) Pulse Rate: 60 (05/12 0757)  Labs: Recent Labs    01/14/19 0647 01/15/19 0645 01/16/19 0438  HGB 11.0* 11.4* 11.6*  HCT 34.2* 35.0* 35.8*  PLT 236 210 241  LABPROT 16.2* 16.1* 18.0*  INR 1.3* 1.3* 1.5*  HEPARINUNFRC 0.39 0.40 0.44  CREATININE 1.00 1.02* 1.20*    Estimated Creatinine Clearance: 52.7 mL/min (A) (by C-G formula based on SCr of 1.2 mg/dL (H)).  Assessment: Anticoag: H/O DVT/Lupus anticoagulant on warfarin PTA. HL 0.44 in goal. INR up to 1.5. Hgb 11.6 stable. - Coumadin 5 mg daily except 2.5 Sun at home, Admit INR 1.3 (5/8)   Goal of Therapy:  Heparin level 0.3-0.7 units/ml  INR 2-3 Monitor platelets by anticoagulation protocol: Yes   Plan:  - Continue heparin gtt at 1450 - Warf 7.5mg  tonight  - Daily HL, CBC, and INR  Frank Pilger S. Alford Highland, PharmD, BCPS Clinical Staff Pharmacist Eilene Ghazi Stillinger 01/16/2019,9:46 AM

## 2019-01-16 NOTE — Progress Notes (Signed)
Progress Note  Patient Name: Theresa [name] Date of Encounter: 01/16/2019  Primary Cardiologist: [name], MD   Subjective   Having some leg pain. It is bilateral, sharp/tingling, diffuse across her skin from feet to thighs. Not located at joints. No changes in rash, no erythema. K and Mg WNL yesterday. Tramadol only worked for about 15 minutes. Has some known neuropathy, takes gabapentin at bedtime. Lying nearly flat in bed, feels that breathing and swelling is improving. Has occasional low BP when lying down, asymptomatic.  Inpatient Medications    Scheduled Meds: . famotidine  20 mg Oral Daily  . [START ON 01/17/2019] furosemide  80 mg Intravenous Daily  . gabapentin  300 mg Oral BID WC  . gabapentin  600 mg Oral QHS  . insulin aspart  0-15 Units Subcutaneous TID WC  . insulin aspart  0-5 Units Subcutaneous QHS  . insulin glargine  10 Units Subcutaneous QHS  . LORazepam  0.5 mg Oral BID  . losartan  25 mg Oral Daily  . metoprolol succinate  25 mg Oral Daily  . mineral oil-hydrophilic petrolatum   Topical q morning - 10a  . potassium chloride  10 mEq Oral BID  . rosuvastatin  10 mg Oral q1800  . sertraline  50 mg Oral Daily  . sodium chloride flush  3 mL Intravenous Q12H  . tamsulosin  0.4 mg Oral Daily  . warfarin  7.5 mg Oral ONCE-1800  . Warfarin - Pharmacist Dosing Inpatient   Does not apply q1800   Continuous Infusions: . sodium chloride    . heparin 1,450 Units/hr (01/16/19 0742)   PRN Meds: sodium chloride, acetaminophen, albuterol, ipratropium-albuterol, sodium chloride flush, traMADol   Vital Signs    Vitals:   01/16/19 0300 01/16/19 0352 01/16/19 0757 01/16/19 1021  BP:  122/67  (!) 86/57  Pulse:  61 60 (!) 59  Resp:  16 16   Temp:  98 F (36.7 C)    TempSrc:  Oral    SpO2: 100% 94% 94%   Weight:      Height:        Intake/Output Summary (Last 24 hours) at 01/16/2019 1045 Last data filed at 01/16/2019 0700 Gross per 24 hour  Intake 1026.78  ml  Output -  Net 1026.78 ml   Last 3 Weights 01/15/2019 01/14/2019 01/14/2019  Weight (lbs) 204 lb 5.9 oz 210 lb 1.6 oz 210 lb 1.6 oz  Weight (kg) 92.7 kg 95.301 kg 95.3 kg      Telemetry    NSR with rare PVCs- Personally Reviewed  ECG    No new tracing since 5/8- Personally Reviewed  Physical Exam   GEN: No acute distress.  Malar erythema, diffuse psoriatic skin lesions Neck: Difficult to assess JVD given body habitus and trach collar. Cardiac: RRR, no murmurs, rubs, or gallops.  Respiratory: Clear in bilateral upper fields, improved in bases GI: Soft, nontender, mildly distended  MS: trace bilateral LE dependent edema, much improved from prior. No joint swelling or tenderness. Skin: chronic psoriatic lesions, but no erythema or warmth. Even feather light touch is painful for her. Neuro:  Nonfocal  Psych: Normal affect   Labs    Chemistry Recent Labs  Lab 01/12/19 2244  01/14/19 0647 01/15/19 0645 01/15/19 1953 01/16/19 0438  NA  --    < > 140 137  --  137  K  --    < > 3.5 3.8 3.9 3.8  CL  --    < >  94* 98  --  95*  CO2  --    < > 33* 29  --  31  GLUCOSE  --    < > 118* 142*  --  171*  BUN  --    < > 14 16  --  20  CREATININE  --    < > 1.00 1.02*  --  1.20*  CALCIUM  --    < > 8.9 9.4  --  9.3  PROT 7.1  --   --   --   --   --   ALBUMIN 3.1*  --   --   --   --   --   AST 23  --   --   --   --   --   ALT 22  --   --   --   --   --   ALKPHOS 121  --   --   --   --   --   BILITOT 1.4*  --   --   --   --   --   GFRNONAA  --    < > >60 >60  --  52*  GFRAA  --    < > >60 >60  --  60*  ANIONGAP  --    < > 13 10  --  11   < > = values in this interval not displayed.     Hematology Recent Labs  Lab 01/14/19 0647 01/15/19 0645 01/16/19 0438  WBC 5.7 6.4 6.8  RBC 3.40* 3.47* 3.58*  HGB 11.0* 11.4* 11.6*  HCT 34.2* 35.0* 35.8*  MCV 100.6* 100.9* 100.0  MCH 32.4 32.9 32.4  MCHC 32.2 32.6 32.4  RDW 14.5 14.5 14.4  PLT 236 210 241    Cardiac Enzymes  Recent Labs  Lab 01/12/19 2244  TROPONINI <0.03   No results for input(s): TROPIPOC in the last 168 hours.   BNP Recent Labs  Lab 01/12/19 2009  BNP 834.6*     DDimer No results for input(s): DDIMER in the last 168 hours.   Radiology    No results found.  Cardiac Studies   ECHO 01/14/2019  1. Severe hypokinesis of the left ventricular, basal-mid inferoseptal wall, inferior wall and inferolateral wall.  2. The left ventricle has mild-moderately reduced systolic function, with an ejection fraction of 40-45%. The cavity size was normal. Left ventricular diastolic Doppler parameters are consistent with pseudonormal. Elevated mean left atrial pressure.  3. The right ventricle has mildly reduced systolic function. The cavity was normal. There is no increase in right ventricular wall thickness. Right ventricular systolic pressure is moderately elevated with an estimated pressure of 58 mmHg.  4. Mitral valve regurgitation is mild to moderate by color flow Doppler. The MR jet is centrally-directed.  5. The inferior vena cava was dilated in size with >50% respiratory variability.  6. Left atrial size was moderately dilated.  7. Right atrial size was mildly dilated.  Right and left heart catheterization March 24, 2018  Severe native coronary artery disease with total occlusion of the proximal LAD, total occlusion of the proximal RCA, and patent circumflex with patent proximal stent with eccentric 50% proximal narrowing. Distal obtuse marginal branches are severely and diffusely diseased.  Bypass graft failure with total occlusion of SVG to RCA.  Patent LIMA to LAD.  Right coronary territory is supplied by collaterals from LAD and circumflex.  Left ventricular systolic dysfunction with EF 35 to 45%. Mid to  distal inferior wall akinesis. Inferobasal and inferoapical wall severely hypokinetic. Left ventricular end-diastolic pressure elevated at 19 mmHg  Moderate to severe pulmonary  hypertension  Diagnostic  Dominance: Right    Fick Cardiac Output 3.32 L/min  Fick Cardiac Output Index 1.84 (L/min)/BSA  RA A Wave 12 mmHg  RA V Wave 17 mmHg  RA Mean 11 mmHg  RV Systolic Pressure 70 mmHg  RV Diastolic Pressure 6 mmHg  RV EDP 15 mmHg  PA Systolic Pressure 66 mmHg  PA Diastolic Pressure 21 mmHg  PA Mean 40 mmHg  PW A Wave 20 mmHg  PW V Wave 12 mmHg  PW Mean 15 mmHg  AO Systolic Pressure 169 mmHg  AO Diastolic Pressure 63 mmHg  AO Mean 83 mmHg  LV Systolic Pressure 450 mmHg  LV Diastolic Pressure 11 mmHg  LV EDP 19 mmHg  AOp Systolic Pressure 388 mmHg  AOp Diastolic Pressure 63 mmHg  AOp Mean Pressure 82 mmHg  LVp Systolic Pressure 828 mmHg  LVp Diastolic Pressure 9 mmHg  LVp EDP Pressure 19 mmHg  QP/QS 1  TPVR Index 21.71 HRUI  TSVR Index 45.06 HRUI  PVR SVR Ratio 0.35  TPVR/TSVR Ratio 0.48     Patient Profile     53 y.o. female admitted with acute on chronic combined systolic and diastolic biventricular heart failure, premature onset CAD (CABG 2007, DES to SVG-PDA 2017, now with known total occlusion of the SVG-PDA as well as total occlusion of the native RCA, total occlusion of the native LAD with patent LIMA to LAD, 50% stenosis in previously stented left circumflex coronary artery with high-grade stenoses and oblique marginal arteries, diffusely diseased), chronic respiratory insufficiency and tracheostomy since 2002, COPD, hypertension, hyperlipidemia, moderate to severe pulmonary artery hypertension, history of DVT and lupus anticoagulant on chronic warfarin therapy, subtherapeutic on admission.  Assessment & Plan    CHF with reduced systolic and diastolic function:  -I/O and weights not updated for the last 24 hrs, put per chart noted as additional 3L out. Weight yesterday 204 lbs (92.7 kg, from 97.9 kg on admission). Prior dry weight was 192-197 lbs per patient, 173 lbs on prior cath. -her Cr went up slightly today. As her swelling is better,  lying nearly flat, will change to daily IV 80 mg furosemide for today. -she is not sure why her weight went up. No clear changes in meds, denies changes in diet -was on metoprolol succinate 25 mg daily as an outpatient, continue -started on losartan 25 mg this admission, BP tolerating. EF is 40-45% on recent echo.  -monitoring potassium, receiving 10 mEq BID given ongoing diuresis  Leg pain: very painful to touch. Diffuse across bilateral legs, from feet to thighs. Not dermatomal. No new lesions. No erythema or joint swelling. Suspect this may be neuropathic, has known neuropathy. Was already on gabapentin 600 mg at bedtime. Will add 300 mg dose morning and afternoon to see if this assists with her pain. Has tramadol PRN for severe pain. Electrolytes ok.  No changes to plan today: PAH: Moderate to severe pulmonary artery hypertension.   -prior RHC with elevated transpulmonary gradient (73mmHg), numbers suggest intrinsic lund disease and not LV dysfunction as primary etiology. -given chronic PAH, her baseline may have chronic edema due to RV overload. -echo this admission shows elevated LAP and RVSP of 58 mmHg  CAD s/p CABG 2007:  No angina.   -known severe native and graft vessel disease, primarily receives myocardial blood flow through the LIMA to LAD  bypass surgery and severely diseased left circumflex coronary system. -known native RCA and SVG-RCA are occluded -prior LVgram and current echo show severe hypokinesis of inferior/inferoseptal/inferolateral walls. - Per previous notes she does have stable angina pectoris, CCS functional class II-3 (angina when walking around the house, resolved spontaneously without sublingual nitroglycerin).    Chronic respiratory insufficiency with hypoxia: on home O2, usually 3-4 L/minute, chronic tracheostomy, history of COPD.   -Previous ABGs suggest mild chronic hypercapnia/CO2 retaining with typical PCO2 45-50.  -Note history of PEA cardiac arrest a few  months ago when she had severe hypoxemia in the setting of a dislodged tracheostomy.  History of DVT/lupus anticoagulant/chronic anticoagulation: Currently on intravenous heparin for subtherapeutic INR, reloading warfarin. INR 1.5 today  DM on insulin: Historically poorly controlled, but most recent hemoglobin A1c is 6.3%.  HLP: Most recent LDL cholesterol was 103, target less than 70.  Per notes she did not tolerate high-dose statins but was taking rosuvastatin 10 mg daily.  Restarted this admission.  HTN: Target BP less than 130/80.  Started ARB this admission.   +hCG: minimal increase, trending down and now borderline elevated at 5, not compatible with pregnancy. Patient denies intercourse in > 1 year.  Also has history of tubal ligation. Suspect cross-reaction false positive.   TIME SPENT WITH PATIENT: >25 minutes of direct patient care. More than 50% of that time was spent on coordination of care and counseling diuresis plans, med changes, leg pain.Buford Dresser, MD, PhD [facility]  For questions or updates, please contact [place] Please consult [email or web address] for contact info under     Signed, Buford Dresser, MD  01/16/2019, 10:45 AM

## 2019-01-16 NOTE — Discharge Instructions (Addendum)
Medication Instructions: - Take Torsemide 80mg  daily (in the morning). If you have > 3 lb weight gain overnight, please take an additional 80mg  later in the evening. - Take potassium supplement as directed with the Torsemide.  - Take Losaran 25mg  daily. - Take Coumadin 6mg  daily in the evening. Please take the 5mg  tablets that you have at home in addition to the 1mg  tablets that we have prescribed you at discharge for a total of 6mg  daily. - Please take all other medications as prescribed elsewhere on your discharge paper work.   YOUR CARDIOLOGY TEAM HAS ARRANGED FOR AN E-VISIT FOR YOUR APPOINTMENT - PLEASE REVIEW IMPORTANT INFORMATION BELOW SEVERAL DAYS PRIOR TO YOUR APPOINTMENT  Due to the recent COVID-19 pandemic, we are transitioning in-person office visits to tele-medicine visits in an effort to decrease unnecessary exposure to our patients, their families, and staff. These visits are billed to your insurance just like a normal visit is. We also encourage you to sign up for MyChart if you have not already done so. You will need a smartphone if possible. For patients that do not have this, we can still complete the visit using a regular telephone but do prefer a smartphone to enable video when possible. You may have a family member that lives with you that can help. If possible, we also ask that you have a blood pressure cuff and scale at home to measure your blood pressure, heart rate and weight prior to your scheduled appointment. Patients with clinical needs that need an in-person evaluation and testing will still be able to come to the office if absolutely necessary. If you have any questions, feel free to call our office.   YOUR PROVIDER WILL BE USING ONE OF THE FOLLOWING PLATFORM TO COMPLETE YOUR VISIT:  MyChart/Doximity/Doxy.Me (our office will call you a couple of days before your appointment with more information)   IF USING MYCHART - How to Download the MyChart App to Your SmartPhone    - If Apple, go to [employer] and type in MyChart in the search bar and download the app. If Android, ask patient to go to [employer] and type in [place] in the search bar and download the app. The app is free but as with any other app downloads, your phone may require you to verify saved payment information or Apple/Android password.  - You will need to then log into the app with your MyChart username and password, and select [facility] as your healthcare provider to link the account.  - When it is time for your visit, go to the MyChart app, find appointments, and click Begin Video Visit. Be sure to Select Allow for your device to access the Microphone and Camera for your visit. You will then be connected, and your provider will be with you shortly.  **If you have any issues connecting or need assistance, please contact MyChart service desk [phone])**  **If using a computer, in order to ensure the best quality for your visit, you will need to use either of the following Internet Browsers: Insurance underwriter or Microsoft Edge**   IF USING DOXIMITY or [email or web address].ME - The staff will give you instructions on receiving your link to join the meeting the day of your visit.    2-3 DAYS BEFORE YOUR APPOINTMENT  You will receive a telephone call from one of our [facility] team members - your caller ID may say "Unknown caller." If this is a video visit, we will walk you  through how to get the video launched on your phone. We will remind you check your blood pressure, heart rate and weight prior to your scheduled appointment. If you have an Apple Watch or Kardia, please upload any pertinent ECG strips the day before or morning of your appointment to [facility]. Our staff will also make sure you have reviewed the consent and agree to move forward with your scheduled tele-health visit.    THE DAY OF YOUR APPOINTMENT  Approximately 15 minutes prior to your scheduled appointment, you will receive a  telephone call from one of [facility] team - your caller ID may say "Unknown caller."  Our staff will confirm medications, vital signs for the day and any symptoms you may be experiencing. Please have this information available prior to the time of visit start. It may also be helpful for you to have a pad of paper and pen handy for any instructions given during your visit. They will also walk you through joining the smartphone meeting if this is a video visit.   CONSENT FOR TELE-HEALTH VISIT - PLEASE REVIEW  I hereby voluntarily request, consent and authorize [facility] and its employed or contracted physicians, physician assistants, nurse practitioners or other licensed health care professionals (the Practitioner), to provide me with telemedicine health care services (the Services") as deemed necessary by the treating Practitioner. I acknowledge and consent to receive the Services by the Practitioner via telemedicine. I understand that the telemedicine visit will involve communicating with the Practitioner through live audiovisual communication technology and the disclosure of certain medical information by electronic transmission. I acknowledge that I have been given the opportunity to request an in-person assessment or other available alternative prior to the telemedicine visit and am voluntarily participating in the telemedicine visit.  I understand that I have the right to withhold or withdraw my consent to the use of telemedicine in the course of my care at any time, without affecting my right to future care or treatment, and that the Practitioner or I may terminate the telemedicine visit at any time. I understand that I have the right to inspect all information obtained and/or recorded in the course of the telemedicine visit and may receive copies of available information for a reasonable fee.  I understand that some of the potential risks of receiving the Services via telemedicine include:    Delay or interruption in medical evaluation due to technological equipment failure or disruption;  Information transmitted may not be sufficient (e.g. poor resolution of images) to allow for appropriate medical decision making by the Practitioner; and/or   In rare instances, security protocols could fail, causing a breach of personal health information.  Furthermore, I acknowledge that it is my responsibility to provide information about my medical history, conditions and care that is complete and accurate to the best of my ability. I acknowledge that Practitioner's advice, recommendations, and/or decision may be based on factors not within their control, such as incomplete or inaccurate data provided by me or distortions of diagnostic images or specimens that may result from electronic transmissions. I understand that the practice of medicine is not an exact science and that Practitioner makes no warranties or guarantees regarding treatment outcomes. I acknowledge that I will receive a copy of this consent concurrently upon execution via email to the email address I last provided but may also request a printed copy by calling the office of [facility].    I understand that my insurance will be billed for this visit.  I have read or had this consent read to me.  I understand the contents of this consent, which adequately explains the benefits and risks of the Services being provided via telemedicine.   I have been provided ample opportunity to ask questions regarding this consent and the Services and have had my questions answered to my satisfaction.  I give my informed consent for the services to be provided through the use of telemedicine in my medical care  By participating in this telemedicine visit I agree to the above. _______________  Information on my medicine - Coumadin   (Warfarin)  Why was Coumadin prescribed for you? Coumadin was prescribed for you because you have a  blood clot or a medical condition that can cause an increased risk of forming blood clots. Blood clots can cause serious health problems by blocking the flow of blood to the heart, lung, or brain. Coumadin can prevent harmful blood clots from forming. As a reminder your indication for Coumadin is:   Blood Clotting Disorder  What test will check on my response to Coumadin? While on Coumadin (warfarin) you will need to have an INR test regularly to ensure that your dose is keeping you in the desired range. The INR (international normalized ratio) number is calculated from the result of the laboratory test called prothrombin time (PT).  If an INR APPOINTMENT HAS NOT ALREADY BEEN MADE FOR YOU please schedule an appointment to have this lab work done by your health care provider within 7 days. Your INR goal is usually a number between:  2 to 3 or your provider may give you a more narrow range like 2-2.5.  Ask your health care provider during an office visit what your goal INR is.  What  do you need to  know  About  COUMADIN? Take Coumadin (warfarin) exactly as prescribed by your healthcare provider about the same time each day.  DO NOT stop taking without talking to the doctor who prescribed the medication.  Stopping without other blood clot prevention medication to take the place of Coumadin may increase your risk of developing a new clot or stroke.  Get refills before you run out.  What do you do if you miss a dose? If you miss a dose, take it as soon as you remember on the same day then continue your regularly scheduled regimen the next day.  Do not take two doses of Coumadin at the same time.  Important Safety Information A possible side effect of Coumadin (Warfarin) is an increased risk of bleeding. You should call your healthcare provider right away if you experience any of the following: ? Bleeding from an injury or your nose that does not stop. ? Unusual colored urine (red or dark brown) or  unusual colored stools (red or black). ? Unusual bruising for unknown reasons. ? A serious fall or if you hit your head (even if there is no bleeding).  Some foods or medicines interact with Coumadin (warfarin) and might alter your response to warfarin. To help avoid this: ? Eat a balanced diet, maintaining a consistent amount of Vitamin K. ? Notify your provider about major diet changes you plan to make. ? Avoid alcohol or limit your intake to 1 drink for women and 2 drinks for men per day. (1 drink is 5 oz. wine, 12 oz. beer, or 1.5 oz. liquor.)  Make sure that ANY health care provider who prescribes medication for you knows that you are taking Coumadin (warfarin).  Also  make sure the healthcare provider who is monitoring your Coumadin knows when you have started a new medication including herbals and non-prescription products.  Coumadin (Warfarin)  Major Drug Interactions  Increased Warfarin Effect Decreased Warfarin Effect  Alcohol (large quantities) Antibiotics (esp. Septra/Bactrim, Flagyl, Cipro) Amiodarone (Cordarone) Aspirin (ASA) Cimetidine (Tagamet) Megestrol (Megace) NSAIDs (ibuprofen, naproxen, etc.) Piroxicam (Feldene) Propafenone (Rythmol SR) Propranolol (Inderal) Isoniazid (INH) Posaconazole (Noxafil) Barbiturates (Phenobarbital) Carbamazepine (Tegretol) Chlordiazepoxide (Librium) Cholestyramine (Questran) Griseofulvin Oral Contraceptives Rifampin Sucralfate (Carafate) Vitamin K   Coumadin (Warfarin) Major Herbal Interactions  Increased Warfarin Effect Decreased Warfarin Effect  Garlic Ginseng Ginkgo biloba Coenzyme Q10 Green tea St. Johns wort    Coumadin (Warfarin) FOOD Interactions  Eat a consistent number of servings per week of foods HIGH in Vitamin K (1 serving =  cup)  Collards (cooked, or boiled & drained) Kale (cooked, or boiled & drained) Mustard greens (cooked, or boiled & drained) Parsley *serving size only =  cup Spinach (cooked, or  boiled & drained) Swiss chard (cooked, or boiled & drained) Turnip greens (cooked, or boiled & drained)  Eat a consistent number of servings per week of foods MEDIUM-HIGH in Vitamin K (1 serving = 1 cup)  Asparagus (cooked, or boiled & drained) Broccoli (cooked, boiled & drained, or raw & chopped) Brussel sprouts (cooked, or boiled & drained) *serving size only =  cup Lettuce, raw (green leaf, endive, romaine) Spinach, raw Turnip greens, raw & chopped   These websites have more information on Coumadin (warfarin):  FailFactory.se; VeganReport.com.au;

## 2019-01-16 NOTE — Plan of Care (Signed)

## 2019-01-16 NOTE — Evaluation (Signed)
Physical Therapy Evaluation Patient Details Name: Theresa [name] MRN: [id number] DOB: December 31, 1965 Today's Date: 01/16/2019   History of Present Illness  53 year old woman with CAD s/p CABG 2007 (SVG -> RCA, LIMA -> LAD) chronic respiratory failure due to COPD (trach-dependent since 2002) c/b recent hypoxic PEA arrest due to dislodged trach, lupus anticoagulant on chronic warfarin, HTN, and IDDM2 who presents with 1 week of weight gain, increased LE and abdominal edema, and shortness of breath. Admitted 5/8 for treatment of acute decompensated systolic HF.  Clinical Impression  PTA pt living with parents in single story home with 4 steps to enter. Pt independent in ambulation without AD and in ADLs. Pt currently limited by neuropathic pain in bilateral LE as well as generalized weakness. Pt is mod I for bed mobility and supervision for transfers and ambulation without AD. Pt would benefit from return to HHPT level rehab at discharge to continue strengthening and improving endurance. PT will continue to follow acutely.     Follow Up Recommendations Home health PT;Supervision/Assistance - 24 hour    Equipment Recommendations  None recommended by PT       Precautions / Restrictions Precautions Precautions: None Restrictions Weight Bearing Restrictions: No      Mobility  Bed Mobility Overal bed mobility: Modified Independent             General bed mobility comments: use of bed rails to come to EoB  Transfers Overall transfer level: Needs assistance Equipment used: None Transfers: Sit to/from Stand Sit to Stand: Supervision         General transfer comment: supervision for safety  Ambulation/Gait Ambulation/Gait assistance: Supervision Gait Distance (Feet): 20 Feet Assistive device: None Gait Pattern/deviations: Step-through pattern;Shuffle Gait velocity: slowed Gait velocity interpretation: 1.31 - 2.62 ft/sec, indicative of limited community ambulator General Gait  Details: supervision for safety and management of lines      Balance Overall balance assessment: Mild deficits observed, not formally tested                                           Pertinent Vitals/Pain Pain Assessment: Faces Faces Pain Scale: Hurts little more Pain Location: LE Pain Descriptors / Indicators: Pins and needles;Tingling;Burning Pain Intervention(s): Limited activity within patient's tolerance;Monitored during session;Premedicated before session;Repositioned    Home Living Family/patient expects to be discharged to:: Private residence Living Arrangements: Parent Available Help at Discharge: Family;Available 24 hours/day Type of Home: House Home Access: Stairs to enter Entrance Stairs-Rails: Psychiatric nurse of Steps: 3 Home Layout: One level Home Equipment: Walker - 2 wheels;Cane - single point;Bedside commode;Wheelchair - manual;Grab bars - tub/shower;Tub bench;Shower seat      Prior Function Level of Independence: Needs assistance   Gait / Transfers Assistance Needed: independent without AD  ADL's / Homemaking Assistance Needed: assist with IADLs           Extremity/Trunk Assessment   Upper Extremity Assessment Upper Extremity Assessment: Overall WFL for tasks assessed    Lower Extremity Assessment Lower Extremity Assessment: Generalized weakness       Communication   Communication: No difficulties  Cognition Arousal/Alertness: Awake/alert Behavior During Therapy: WFL for tasks assessed/performed Overall Cognitive Status: Within Functional Limits for tasks assessed  General Comments General comments (skin integrity, edema, etc.): Pt on 5L O2 via [place] with SaO2 >95%O2 throughout session     Exercises General Exercises - Lower Extremity Ankle Circles/Pumps: AROM;Both;10 reps Long Arc Quad: AROM;Both;10 reps;Seated Hip ABduction/ADduction: AROM;Both;10  reps;Seated Straight Leg Raises: AROM;5 reps;Both;Supine Toe Raises: AROM;Both;5 reps;Seated Heel Raises: AROM;Both;10 reps;Seated   Assessment/Plan    PT Assessment Patient needs continued PT services  PT Problem List Impaired sensation;Pain;Cardiopulmonary status limiting activity;Decreased activity tolerance       PT Treatment Interventions DME instruction;Gait training;Stair training;Functional mobility training;Therapeutic activities;Therapeutic exercise;Balance training;Cognitive remediation;Patient/family education    PT Goals (Current goals can be found in the Care Plan section)  Acute Rehab PT Goals Patient Stated Goal: go home soon PT Goal Formulation: With patient Time For Goal Achievement: 01/30/19 Potential to Achieve Goals: Good    Frequency Min 3X/week    AM-PAC PT "6 Clicks" Mobility  Outcome Measure Help needed turning from your back to your side while in a flat bed without using bedrails?: None Help needed moving from lying on your back to sitting on the side of a flat bed without using bedrails?: None Help needed moving to and from a bed to a chair (including a wheelchair)?: None Help needed standing up from a chair using your arms (e.g., wheelchair or bedside chair)?: None Help needed to walk in hospital room?: None Help needed climbing 3-5 steps with a railing? : A Little 6 Click Score: 23    End of Session Equipment Utilized During Treatment: Oxygen;Gait belt Activity Tolerance: Patient tolerated treatment well Patient left: in bed;with call bell/phone within reach Nurse Communication: Mobility status PT Visit Diagnosis: Other abnormalities of gait and mobility (R26.89);Muscle weakness (generalized) (M62.81);Pain Pain - Right/Left: (bilateral) Pain - part of body: Leg    Time: 3736-6815 PT Time Calculation (min) (ACUTE ONLY): 26 min   Charges:   PT Evaluation $PT Eval Moderate Complexity: 1 Mod PT Treatments $Gait Training: 8-22 mins         Charleigh Correnti B. Migdalia Dk PT, DPT Acute Rehabilitation Services Pager [phone] Office [phone]   [place] 01/16/2019, 1:53 PM

## 2019-01-17 ENCOUNTER — Encounter (HOSPITAL_COMMUNITY): Payer: Self-pay

## 2019-01-17 DIAGNOSIS — I2510 Atherosclerotic heart disease of native coronary artery without angina pectoris: Secondary | ICD-10-CM

## 2019-01-17 DIAGNOSIS — D6862 Lupus anticoagulant syndrome: Secondary | ICD-10-CM

## 2019-01-17 LAB — GLUCOSE, CAPILLARY
Glucose-Capillary: 139 mg/dL — ABNORMAL HIGH (ref 70–99)
Glucose-Capillary: 166 mg/dL — ABNORMAL HIGH (ref 70–99)
Glucose-Capillary: 203 mg/dL — ABNORMAL HIGH (ref 70–99)
Glucose-Capillary: 158 mg/dL — ABNORMAL HIGH (ref 70–99)

## 2019-01-17 LAB — CBC
HCT: 36.4 % (ref 36.0–46.0)
Hemoglobin: 12 g/dL (ref 12.0–15.0)
MCH: 32.8 pg (ref 26.0–34.0)
MCHC: 33 g/dL (ref 30.0–36.0)
MCV: 99.5 fL (ref 80.0–100.0)
Platelets: 251 10*3/uL (ref 150–400)
RBC: 3.66 MIL/uL — ABNORMAL LOW (ref 3.87–5.11)
RDW: 14.2 % (ref 11.5–15.5)
WBC: 7.1 10*3/uL (ref 4.0–10.5)
nRBC: 0 % (ref 0.0–0.2)

## 2019-01-17 LAB — BASIC METABOLIC PANEL
Anion gap: 10 (ref 5–15)
BUN: 25 mg/dL — ABNORMAL HIGH (ref 6–20)
CO2: 31 mmol/L (ref 22–32)
Calcium: 9.4 mg/dL (ref 8.9–10.3)
Chloride: 95 mmol/L — ABNORMAL LOW (ref 98–111)
Creatinine, Ser: 1.16 mg/dL — ABNORMAL HIGH (ref 0.44–1.00)
GFR calc Af Amer: >60 mL/min (ref >60)
GFR calc non Af Amer: 54 mL/min — ABNORMAL LOW (ref >60)
Glucose, Bld: 145 mg/dL — ABNORMAL HIGH (ref 70–99)
Potassium: 4.5 mmol/L (ref 3.5–5.1)
Sodium: 136 mmol/L (ref 135–145)

## 2019-01-17 LAB — HEPARIN LEVEL (UNFRACTIONATED): Heparin Unfractionated: 0.57 IU/mL (ref 0.30–0.70)

## 2019-01-17 LAB — PROTIME-INR
INR: 1.9 — ABNORMAL HIGH (ref 0.8–1.2)
Prothrombin Time: 21.5 seconds — ABNORMAL HIGH (ref 11.4–15.2)

## 2019-01-17 MED ORDER — WARFARIN SODIUM 5 MG PO TABS
5.0000 mg | ORAL_TABLET | Freq: Once | ORAL | Status: AC
  Administered 2019-01-17: 5 mg via ORAL
  Filled 2019-01-17: qty 1

## 2019-01-17 NOTE — Progress Notes (Signed)
Physical Therapy Treatment Patient Details Name: Theresa [name] MRN: [id number] DOB: Oct 19, 1965 Today's Date: 01/17/2019    History of Present Illness 53 year old woman with CAD s/p CABG 2007 (SVG -> RCA, LIMA -> LAD) chronic respiratory failure due to COPD (trach-dependent since 2002) c/b recent hypoxic PEA arrest due to dislodged trach, lupus anticoagulant on chronic warfarin, HTN, and IDDM2 who presents with 1 week of weight gain, increased LE and abdominal edema, and shortness of breath. Admitted 5/8 for treatment of acute decompensated systolic HF.    PT Comments    Patient ambulating 50' this visit on 5L, VSS. Supervision level without AD, will cont to follow to progress tolerance.   Follow Up Recommendations  Home health PT;Supervision/Assistance - 24 hour     Equipment Recommendations  None recommended by PT    Recommendations for Other Services       Precautions / Restrictions Precautions Precautions: None Restrictions Weight Bearing Restrictions: No    Mobility  Bed Mobility Overal bed mobility: Modified Independent             General bed mobility comments: use of bed rails to come to EoB  Transfers Overall transfer level: Needs assistance Equipment used: None Transfers: Sit to/from Stand Sit to Stand: Supervision         General transfer comment: supervision for safety  Ambulation/Gait Ambulation/Gait assistance: Supervision Gait Distance (Feet): 30 Feet Assistive device: None Gait Pattern/deviations: Step-through pattern;Shuffle Gait velocity: slowed   General Gait Details: supervision, ambulating well, satting well on 5L    Stairs             Wheelchair Mobility    Modified Rankin (Stroke Patients Only)       Balance Overall balance assessment: Mild deficits observed, not formally tested                                          Cognition Arousal/Alertness: Awake/alert Behavior During Therapy: WFL for  tasks assessed/performed Overall Cognitive Status: Within Functional Limits for tasks assessed                                        Exercises General Exercises - Lower Extremity Ankle Circles/Pumps: AROM;Both;10 reps Long Arc Quad: AROM;Both;10 reps;Seated Hip ABduction/ADduction: AROM;Both;10 reps;Seated Straight Leg Raises: AROM;5 reps;Both;Supine Toe Raises: AROM;Both;5 reps;Seated Heel Raises: AROM;Both;10 reps;Seated    General Comments        Pertinent Vitals/Pain Faces Pain Scale: Hurts little more Pain Location: LE Pain Descriptors / Indicators: Pins and needles;Tingling;Burning    Home Living                      Prior Function            PT Goals (current goals can now be found in the care plan section) Acute Rehab PT Goals Patient Stated Goal: go home soon PT Goal Formulation: With patient Time For Goal Achievement: 01/30/19 Potential to Achieve Goals: Good    Frequency    Min 3X/week      PT Plan      Co-evaluation              AM-PAC PT "6 Clicks" Mobility   Outcome Measure  Help needed turning from your back to your side while  in a flat bed without using bedrails?: None Help needed moving from lying on your back to sitting on the side of a flat bed without using bedrails?: None Help needed moving to and from a bed to a chair (including a wheelchair)?: None Help needed standing up from a chair using your arms (e.g., wheelchair or bedside chair)?: None Help needed to walk in hospital room?: None Help needed climbing 3-5 steps with a railing? : A Little 6 Click Score: 23    End of Session Equipment Utilized During Treatment: Oxygen;Gait belt Activity Tolerance: Patient tolerated treatment well Patient left: in bed;with call bell/phone within reach Nurse Communication: Mobility status PT Visit Diagnosis: Other abnormalities of gait and mobility (R26.89);Muscle weakness (generalized) (M62.81);Pain Pain -  Right/Left: (bilateral) Pain - part of body: Leg     Time: 1545-1600 PT Time Calculation (min) (ACUTE ONLY): 15 min  Charges:  $Gait Training: 8-22 mins                     Reinaldo Berber, PT, DPT Acute Rehabilitation Services Pager: [phone] Office: [phone]     Reinaldo Berber 01/17/2019, 4:41 PM

## 2019-01-17 NOTE — Progress Notes (Signed)
ANTICOAGULATION CONSULT NOTE - Follow Up Consult  Pharmacy Consult for Heparin/Coumadin Indication: DVT/Lupus anticoagulant   Allergies  Allergen Reactions  . Penicillins Rash    Has patient had a PCN reaction causing immediate rash, facial/tongue/throat swelling, SOB or lightheadedness with hypotension: Yes Has patient had a PCN reaction causing severe rash involving mucus membranes or skin necrosis: No Has patient had a PCN reaction that required hospitalization No Has patient had a PCN reaction occurring within the last 10 years: No If all of the above answers are "NO", then may proceed with Cephalosporin use.   REACTION: rash  . Ciprofloxacin     nausea  . Ibuprofen Rash    swelling in leg     Patient Measurements: Height: 4\' 10"  (147.3 cm) Weight: 203 lb (92.1 kg) IBW/kg (Calculated) : 40.9 Heparin Dosing Weight: 65.2 kg  Vital Signs: Temp: 98.3 F (36.8 C) (05/13 0510) Temp Source: Oral (05/13 0510) BP: 105/57 (05/13 0510) Pulse Rate: 71 (05/13 0755)  Labs: Recent Labs    01/15/19 0645 01/16/19 0438 01/17/19 0504  HGB 11.4* 11.6* 12.0  HCT 35.0* 35.8* 36.4  PLT 210 241 251  LABPROT 16.1* 18.0* 21.5*  INR 1.3* 1.5* 1.9*  HEPARINUNFRC 0.40 0.44 0.57  CREATININE 1.02* 1.20* 1.16*    Estimated Creatinine Clearance: 54.4 mL/min (A) (by C-G formula based on SCr of 1.16 mg/dL (H)).  Assessment:  Anticoag: H/O DVT/Lupus anticoagulant on warfarin PTA. HL 0.57 in goal. INR up to 1.9. Hgb 12 stable. - Coumadin 5 mg daily except 2.5 Sun at home, Admit INR 1.3 (5/8)   Goal of Therapy:  Heparin level 0.3-0.7 units/ml  INR 2-3 Monitor platelets by anticoagulation protocol: Yes   Plan:  - Continue heparin gtt at 1450 - Warf 5 mg tonight  - Daily HL, CBC, and INR  Kyler Germer S. Alford Highland, PharmD, BCPS Clinical Staff Pharmacist Eilene Ghazi [place] 01/17/2019,9:46 AM

## 2019-01-17 NOTE — Care Management Important Message (Signed)
Important Message  Patient Details  Name: Theresa [name] MRN: [id number] Date of Birth: Nov 21, 1965   Medicare Important Message Given:  No  Contact precaution in place.   Valeria Krisko 01/17/2019, 12:36 PM

## 2019-01-17 NOTE — Progress Notes (Signed)
Progress Note  Patient Name: Theresa [name] Date of Encounter: 01/17/2019  Primary Cardiologist: [name], MD   Subjective   Sleeping comfortably this AM. Leg pain improved with starting gabapentin AM and mid day doses. Urinating well. Walked with PT yesterday, starting to feel like she is getting her strength back.  Inpatient Medications    Scheduled Meds: . famotidine  20 mg Oral Daily  . furosemide  80 mg Intravenous Daily  . gabapentin  300 mg Oral BID WC  . gabapentin  600 mg Oral QHS  . insulin aspart  0-15 Units Subcutaneous TID WC  . insulin aspart  0-5 Units Subcutaneous QHS  . insulin glargine  10 Units Subcutaneous QHS  . LORazepam  0.5 mg Oral BID  . losartan  25 mg Oral Daily  . metoprolol succinate  25 mg Oral Daily  . mineral oil-hydrophilic petrolatum   Topical q morning - 10a  . potassium chloride  10 mEq Oral BID  . rosuvastatin  10 mg Oral q1800  . sertraline  50 mg Oral Daily  . sodium chloride flush  3 mL Intravenous Q12H  . tamsulosin  0.4 mg Oral Daily  . warfarin  5 mg Oral ONCE-1800  . Warfarin - Pharmacist Dosing Inpatient   Does not apply q1800   Continuous Infusions: . sodium chloride    . heparin 1,450 Units/hr (01/17/19 1114)   PRN Meds: sodium chloride, acetaminophen, albuterol, ipratropium-albuterol, sodium chloride flush   Vital Signs    Vitals:   01/17/19 0755 01/17/19 1006 01/17/19 1124 01/17/19 1216  BP:  112/68 (!) 112/59   Pulse: 71 71 66 75  Resp: 18  18 18   Temp:   98.1 F (36.7 C)   TempSrc:   Oral   SpO2: 95%  94% 98%  Weight:      Height:        Intake/Output Summary (Last 24 hours) at 01/17/2019 1304 Last data filed at 01/17/2019 0900 Gross per 24 hour  Intake 1218.7 ml  Output 2400 ml  Net -1181.3 ml   Last 3 Weights 01/17/2019 01/15/2019 01/14/2019  Weight (lbs) 203 lb 204 lb 5.9 oz 210 lb 1.6 oz  Weight (kg) 92.08 kg 92.7 kg 95.301 kg      Telemetry    NSR with rare PVCs- Personally Reviewed   ECG    No new tracing since 5/8- Personally Reviewed  Physical Exam   GEN: No acute distress.  Malar erythema, diffuse psoriatic skin lesions Neck: Difficult to assess JVD given body habitus and trach collar. Cardiac: RRR, no murmurs, rubs, or gallops.  Respiratory: Clear in bilateral upper fields, improved in bases, only fine rales remain GI: Soft, nontender, mildly distended  MS: trace bilateral LE dependent edema, much improved from prior. No joint swelling or tenderness. Skin: chronic psoriatic lesions, but no erythema or warmth. Tolerates touch much better today Neuro:  Nonfocal  Psych: Normal affect   Labs    Chemistry Recent Labs  Lab 01/12/19 2244  01/15/19 0645 01/15/19 1953 01/16/19 0438 01/17/19 0504  NA  --    < > 137  --  137 136  K  --    < > 3.8 3.9 3.8 4.5  CL  --    < > 98  --  95* 95*  CO2  --    < > 29  --  31 31  GLUCOSE  --    < > 142*  --  171* 145*  BUN  --    < >  16  --  20 25*  CREATININE  --    < > 1.02*  --  1.20* 1.16*  CALCIUM  --    < > 9.4  --  9.3 9.4  PROT 7.1  --   --   --   --   --   ALBUMIN 3.1*  --   --   --   --   --   AST 23  --   --   --   --   --   ALT 22  --   --   --   --   --   ALKPHOS 121  --   --   --   --   --   BILITOT 1.4*  --   --   --   --   --   GFRNONAA  --    < > >60  --  52* 54*  GFRAA  --    < > >60  --  60* >60  ANIONGAP  --    < > 10  --  11 10   < > = values in this interval not displayed.     Hematology Recent Labs  Lab 01/15/19 0645 01/16/19 0438 01/17/19 0504  WBC 6.4 6.8 7.1  RBC 3.47* 3.58* 3.66*  HGB 11.4* 11.6* 12.0  HCT 35.0* 35.8* 36.4  MCV 100.9* 100.0 99.5  MCH 32.9 32.4 32.8  MCHC 32.6 32.4 33.0  RDW 14.5 14.4 14.2  PLT 210 241 251    Cardiac Enzymes Recent Labs  Lab 01/12/19 2244  TROPONINI <0.03   No results for input(s): TROPIPOC in the last 168 hours.   BNP Recent Labs  Lab 01/12/19 2009  BNP 834.6*     DDimer No results for input(s): DDIMER in the last 168 hours.    Radiology    No results found.  Cardiac Studies   ECHO 01/14/2019  1. Severe hypokinesis of the left ventricular, basal-mid inferoseptal wall, inferior wall and inferolateral wall.  2. The left ventricle has mild-moderately reduced systolic function, with an ejection fraction of 40-45%. The cavity size was normal. Left ventricular diastolic Doppler parameters are consistent with pseudonormal. Elevated mean left atrial pressure.  3. The right ventricle has mildly reduced systolic function. The cavity was normal. There is no increase in right ventricular wall thickness. Right ventricular systolic pressure is moderately elevated with an estimated pressure of 58 mmHg.  4. Mitral valve regurgitation is mild to moderate by color flow Doppler. The MR jet is centrally-directed.  5. The inferior vena cava was dilated in size with >50% respiratory variability.  6. Left atrial size was moderately dilated.  7. Right atrial size was mildly dilated.  Right and left heart catheterization March 24, 2018  Severe native coronary artery disease with total occlusion of the proximal LAD, total occlusion of the proximal RCA, and patent circumflex with patent proximal stent with eccentric 50% proximal narrowing. Distal obtuse marginal branches are severely and diffusely diseased.  Bypass graft failure with total occlusion of SVG to RCA.  Patent LIMA to LAD.  Right coronary territory is supplied by collaterals from LAD and circumflex.  Left ventricular systolic dysfunction with EF 35 to 45%. Mid to distal inferior wall akinesis. Inferobasal and inferoapical wall severely hypokinetic. Left ventricular end-diastolic pressure elevated at 19 mmHg  Moderate to severe pulmonary hypertension  Diagnostic  Dominance: Right    Fick Cardiac Output 3.32 L/min  Fick Cardiac Output Index 1.84 (L/min)/BSA  RA A Wave 12 mmHg  RA V Wave 17 mmHg  RA Mean 11 mmHg  RV Systolic Pressure 70 mmHg  RV Diastolic Pressure  6 mmHg  RV EDP 15 mmHg  PA Systolic Pressure 66 mmHg  PA Diastolic Pressure 21 mmHg  PA Mean 40 mmHg  PW A Wave 20 mmHg  PW V Wave 12 mmHg  PW Mean 15 mmHg  AO Systolic Pressure 378 mmHg  AO Diastolic Pressure 63 mmHg  AO Mean 83 mmHg  LV Systolic Pressure 588 mmHg  LV Diastolic Pressure 11 mmHg  LV EDP 19 mmHg  AOp Systolic Pressure 502 mmHg  AOp Diastolic Pressure 63 mmHg  AOp Mean Pressure 82 mmHg  LVp Systolic Pressure 774 mmHg  LVp Diastolic Pressure 9 mmHg  LVp EDP Pressure 19 mmHg  QP/QS 1  TPVR Index 21.71 HRUI  TSVR Index 45.06 HRUI  PVR SVR Ratio 0.35  TPVR/TSVR Ratio 0.48     Patient Profile     53 y.o. female admitted with acute on chronic combined systolic and diastolic biventricular heart failure, premature onset CAD (CABG 2007, DES to SVG-PDA 2017, now with known total occlusion of the SVG-PDA as well as total occlusion of the native RCA, total occlusion of the native LAD with patent LIMA to LAD, 50% stenosis in previously stented left circumflex coronary artery with high-grade stenoses and oblique marginal arteries, diffusely diseased), chronic respiratory insufficiency and tracheostomy since 2002, COPD, hypertension, hyperlipidemia, moderate to severe pulmonary artery hypertension, history of DVT and lupus anticoagulant on chronic warfarin therapy, subtherapeutic on admission.  Assessment & Plan    CHF with reduced systolic and diastolic function:  -I/O and weights not updated for the last 24 hrs, put per chart noted as additional 3L out. Weight today 203 lbs (92.1 kg, from 97.9 kg on admission). Prior dry weight was 192-197 lbs per patient, 173 lbs on prior cath. -her Cr is stable to mildly improved from yesterday (peak 1.2, today 1.16). Continue daily IV 80 mg furosemide for today. Encouraged activity, working with PT to mobilize fluid. -suspect we can trial oral diuretic starting tomorrow. Would watch for one day on this as she report poor urine output on oral  meds in the past. -was on metoprolol succinate 25 mg daily as an outpatient, continue -started on losartan 25 mg this admission, BP borderline. EF is 40-45% on recent echo.  -monitoring potassium, receiving 10 mEq BID given ongoing diuresis  Leg pain: much improved with addition of AM and mid day gabapentin to nighttime gabapentin dose. Continue  History of DVT/lupus anticoagulant/chronic anticoagulation: Currently on intravenous heparin for subtherapeutic INR, reloading warfarin. INR 1.9 today  HTN: Target BP less than 130/80.  Started ARB this admission. BP running low 128N systolic, occasionally reads in the upper 80s while lying down. She is asymptomatic, but watch closely.  Appreciate PT recommendation for home heatlh PT.  No changes to plan today: PAH: Moderate to severe pulmonary artery hypertension.   -prior RHC with elevated transpulmonary gradient (80mmHg), numbers suggest intrinsic lund disease and not LV dysfunction as primary etiology. -given chronic PAH, her baseline may have chronic edema due to RV overload. -echo this admission shows elevated LAP and RVSP of 58 mmHg  CAD s/p CABG 2007:  No angina.   -known severe native and graft vessel disease, primarily receives myocardial blood flow through the LIMA to LAD bypass surgery and severely diseased left circumflex coronary system. -known native RCA and SVG-RCA are occluded -prior LVgram and current  echo show severe hypokinesis of inferior/inferoseptal/inferolateral walls. - Per previous notes she does have stable angina pectoris, CCS functional class II-3 (angina when walking around the house, resolved spontaneously without sublingual nitroglycerin).    Chronic respiratory insufficiency with hypoxia: on home O2, usually 3-4 L/minute, chronic tracheostomy, history of COPD.   -Previous ABGs suggest mild chronic hypercapnia/CO2 retaining with typical PCO2 45-50.  -Note history of PEA cardiac arrest a few months ago when she had  severe hypoxemia in the setting of a dislodged tracheostomy.  DM on insulin: Historically poorly controlled, but most recent hemoglobin A1c is 6.3%.  HLP: Most recent LDL cholesterol was 103, target less than 70.  Per notes she did not tolerate high-dose statins but was taking rosuvastatin 10 mg daily.  Restarted this admission.  +hCG: minimal increase, trending down and now borderline elevated at 5, not compatible with pregnancy. Patient denies intercourse in > 1 year.  Also has history of tubal ligation. Suspect cross-reaction false positive.  TIME SPENT WITH PATIENT: 25 minutes of direct patient care. More than 50% of that time was spent on coordination of care and counseling diuresis plans, med management plans.  Buford Dresser, MD, PhD [facility]  For questions or updates, please contact [place] Please consult [email or web address] for contact info under     Signed, Buford Dresser, MD  01/17/2019, 1:04 PM

## 2019-01-17 NOTE — Consult Note (Signed)
   [facility] CM Inpatient Consult   01/17/2019  Theresa [name] 1966-04-26 [id number]   Patient screened for high risk score 22 % for unplanned readmission  and hospitalizations to check if potential [place] Management services are needed . Patient was hospitalized for   Per chart review of MD notes:   [name] is a 53 year old woman with CAD s/p CABG 2007 (SVG -> RCA, LIMA -> LAD) chronic respiratory failure due to COPD (trach-dependent since 2002) c/b recent hypoxic PEA arrest due to dislodged trach, lupus anticoagulant on chronic warfarin, HTN, and IDDM2 who presents with 1 week of weight gain, increased LE and abdominal edema, and shortness of breath. Patient was admitted with worsening hypoxia, pulmonary edema on CXR, increased LE and abdominal edema, and mild AKI.  Continue to follow progress and disposition to assess for post hospital care management needs.  Patient is to return home with home health.  Please place a [facility] Care Management consult as appropriate and for questions contact:   Natividad Brood, RN BSN [facility] Liaison  [phone] business mobile phone Toll free office [phone]  Fax number: [phone] [name]

## 2019-01-18 LAB — BASIC METABOLIC PANEL
Anion gap: 12 (ref 5–15)
BUN: 25 mg/dL — ABNORMAL HIGH (ref 6–20)
CO2: 27 mmol/L (ref 22–32)
Calcium: 9.7 mg/dL (ref 8.9–10.3)
Chloride: 97 mmol/L — ABNORMAL LOW (ref 98–111)
Creatinine, Ser: 1.18 mg/dL — ABNORMAL HIGH (ref 0.44–1.00)
GFR calc Af Amer: >60 mL/min (ref >60)
GFR calc non Af Amer: 53 mL/min — ABNORMAL LOW (ref >60)
Glucose, Bld: 150 mg/dL — ABNORMAL HIGH (ref 70–99)
Potassium: 4.8 mmol/L (ref 3.5–5.1)
Sodium: 136 mmol/L (ref 135–145)

## 2019-01-18 LAB — PROTIME-INR
INR: 2.1 — ABNORMAL HIGH (ref 0.8–1.2)
Prothrombin Time: 23.5 seconds — ABNORMAL HIGH (ref 11.4–15.2)

## 2019-01-18 LAB — CBC
HCT: 38.1 % (ref 36.0–46.0)
Hemoglobin: 12.4 g/dL (ref 12.0–15.0)
MCH: 32.4 pg (ref 26.0–34.0)
MCHC: 32.5 g/dL (ref 30.0–36.0)
MCV: 99.5 fL (ref 80.0–100.0)
Platelets: 246 10*3/uL (ref 150–400)
RBC: 3.83 MIL/uL — ABNORMAL LOW (ref 3.87–5.11)
RDW: 14.3 % (ref 11.5–15.5)
WBC: 7 10*3/uL (ref 4.0–10.5)
nRBC: 0 % (ref 0.0–0.2)

## 2019-01-18 LAB — GLUCOSE, CAPILLARY
Glucose-Capillary: 148 mg/dL — ABNORMAL HIGH (ref 70–99)
Glucose-Capillary: 226 mg/dL — ABNORMAL HIGH (ref 70–99)
Glucose-Capillary: 170 mg/dL — ABNORMAL HIGH (ref 70–99)
Glucose-Capillary: 234 mg/dL — ABNORMAL HIGH (ref 70–99)

## 2019-01-18 LAB — HEPARIN LEVEL (UNFRACTIONATED): Heparin Unfractionated: 0.66 IU/mL (ref 0.30–0.70)

## 2019-01-18 MED ORDER — WARFARIN SODIUM 3 MG PO TABS
6.0000 mg | ORAL_TABLET | Freq: Once | ORAL | Status: AC
  Administered 2019-01-18: 6 mg via ORAL
  Filled 2019-01-18: qty 2

## 2019-01-18 MED ORDER — TORSEMIDE 20 MG PO TABS
80.0000 mg | ORAL_TABLET | Freq: Every day | ORAL | Status: DC
  Administered 2019-01-18 – 2019-01-19 (×2): 80 mg via ORAL
  Filled 2019-01-18 (×2): qty 4

## 2019-01-18 NOTE — Progress Notes (Signed)
Progress Note  Patient Name: Theresa [name] Date of Encounter: 01/18/2019  Primary Cardiologist: [name], MD   Subjective   Comfortable this AM. Feels that abdominal bloating is better but not baseline. Received AM torsemide dose, hasn't urinated yet. Reviewed plan for oral diuretic titration.  Inpatient Medications    Scheduled Meds: . famotidine  20 mg Oral Daily  . gabapentin  300 mg Oral BID WC  . gabapentin  600 mg Oral QHS  . insulin aspart  0-15 Units Subcutaneous TID WC  . insulin aspart  0-5 Units Subcutaneous QHS  . insulin glargine  10 Units Subcutaneous QHS  . LORazepam  0.5 mg Oral BID  . losartan  25 mg Oral Daily  . metoprolol succinate  25 mg Oral Daily  . mineral oil-hydrophilic petrolatum   Topical q morning - 10a  . potassium chloride  10 mEq Oral BID  . rosuvastatin  10 mg Oral q1800  . sertraline  50 mg Oral Daily  . sodium chloride flush  3 mL Intravenous Q12H  . tamsulosin  0.4 mg Oral Daily  . torsemide  80 mg Oral Daily  . warfarin  6 mg Oral ONCE-1800  . Warfarin - Pharmacist Dosing Inpatient   Does not apply q1800   Continuous Infusions: . sodium chloride     PRN Meds: sodium chloride, acetaminophen, albuterol, ipratropium-albuterol, sodium chloride flush   Vital Signs    Vitals:   01/18/19 0524 01/18/19 0835 01/18/19 1038 01/18/19 1130  BP: (!) 103/56  105/83   Pulse: 63 60 66 67  Resp: 18 16  18   Temp: 98.3 F (36.8 C)     TempSrc: Oral     SpO2: 92% 92% 96% 95%  Weight: 92 kg     Height:        Intake/Output Summary (Last 24 hours) at 01/18/2019 1144 Last data filed at 01/18/2019 1049 Gross per 24 hour  Intake 1170.67 ml  Output 2400 ml  Net -1229.33 ml   Last 3 Weights 01/18/2019 01/17/2019 01/15/2019  Weight (lbs) 202 lb 14.4 oz 203 lb 204 lb 5.9 oz  Weight (kg) 92.035 kg 92.08 kg 92.7 kg      Telemetry    NSR with rare PVCs- Personally Reviewed  ECG    No new tracing since 5/8- Personally Reviewed   Physical Exam   GEN: No acute distress.  Malar erythema, diffuse psoriatic skin lesions Neck: Difficult to assess JVD given body habitus and trach collar. Cardiac: RRR, no murmurs, rubs, or gallops.  Respiratory: Clear in bilateral upper fields, did not appreciate rales today GI: Soft, nontender, mildly distended  MS: trace bilateral LE dependent edema, much improved from prior. No joint swelling or tenderness. Skin: chronic psoriatic lesions, but no erythema or warmth. Nontender Neuro:  Nonfocal  Psych: Normal affect   Labs    Chemistry Recent Labs  Lab 01/12/19 2244  01/16/19 0438 01/17/19 0504 01/18/19 0530  NA  --    < > 137 136 136  K  --    < > 3.8 4.5 4.8  CL  --    < > 95* 95* 97*  CO2  --    < > 31 31 27   GLUCOSE  --    < > 171* 145* 150*  BUN  --    < > 20 25* 25*  CREATININE  --    < > 1.20* 1.16* 1.18*  CALCIUM  --    < > 9.3 9.4  9.7  PROT 7.1  --   --   --   --   ALBUMIN 3.1*  --   --   --   --   AST 23  --   --   --   --   ALT 22  --   --   --   --   ALKPHOS 121  --   --   --   --   BILITOT 1.4*  --   --   --   --   GFRNONAA  --    < > 52* 54* 53*  GFRAA  --    < > 60* >60 >60  ANIONGAP  --    < > 11 10 12    < > = values in this interval not displayed.     Hematology Recent Labs  Lab 01/16/19 0438 01/17/19 0504 01/18/19 0530  WBC 6.8 7.1 7.0  RBC 3.58* 3.66* 3.83*  HGB 11.6* 12.0 12.4  HCT 35.8* 36.4 38.1  MCV 100.0 99.5 99.5  MCH 32.4 32.8 32.4  MCHC 32.4 33.0 32.5  RDW 14.4 14.2 14.3  PLT 241 251 246    Cardiac Enzymes Recent Labs  Lab 01/12/19 2244  TROPONINI <0.03   No results for input(s): TROPIPOC in the last 168 hours.   BNP Recent Labs  Lab 01/12/19 2009  BNP 834.6*     DDimer No results for input(s): DDIMER in the last 168 hours.   Radiology    No results found.  Cardiac Studies   ECHO 01/14/2019  1. Severe hypokinesis of the left ventricular, basal-mid inferoseptal wall, inferior wall and inferolateral wall.  2.  The left ventricle has mild-moderately reduced systolic function, with an ejection fraction of 40-45%. The cavity size was normal. Left ventricular diastolic Doppler parameters are consistent with pseudonormal. Elevated mean left atrial pressure.  3. The right ventricle has mildly reduced systolic function. The cavity was normal. There is no increase in right ventricular wall thickness. Right ventricular systolic pressure is moderately elevated with an estimated pressure of 58 mmHg.  4. Mitral valve regurgitation is mild to moderate by color flow Doppler. The MR jet is centrally-directed.  5. The inferior vena cava was dilated in size with >50% respiratory variability.  6. Left atrial size was moderately dilated.  7. Right atrial size was mildly dilated.  Right and left heart catheterization March 24, 2018  Severe native coronary artery disease with total occlusion of the proximal LAD, total occlusion of the proximal RCA, and patent circumflex with patent proximal stent with eccentric 50% proximal narrowing. Distal obtuse marginal branches are severely and diffusely diseased.  Bypass graft failure with total occlusion of SVG to RCA.  Patent LIMA to LAD.  Right coronary territory is supplied by collaterals from LAD and circumflex.  Left ventricular systolic dysfunction with EF 35 to 45%. Mid to distal inferior wall akinesis. Inferobasal and inferoapical wall severely hypokinetic. Left ventricular end-diastolic pressure elevated at 19 mmHg  Moderate to severe pulmonary hypertension  Diagnostic  Dominance: Right    Fick Cardiac Output 3.32 L/min  Fick Cardiac Output Index 1.84 (L/min)/BSA  RA A Wave 12 mmHg  RA V Wave 17 mmHg  RA Mean 11 mmHg  RV Systolic Pressure 70 mmHg  RV Diastolic Pressure 6 mmHg  RV EDP 15 mmHg  PA Systolic Pressure 66 mmHg  PA Diastolic Pressure 21 mmHg  PA Mean 40 mmHg  PW A Wave 20 mmHg  PW V Wave 12 mmHg  PW Mean 15 mmHg  AO Systolic Pressure 786 mmHg   AO Diastolic Pressure 63 mmHg  AO Mean 83 mmHg  LV Systolic Pressure 767 mmHg  LV Diastolic Pressure 11 mmHg  LV EDP 19 mmHg  AOp Systolic Pressure 209 mmHg  AOp Diastolic Pressure 63 mmHg  AOp Mean Pressure 82 mmHg  LVp Systolic Pressure 470 mmHg  LVp Diastolic Pressure 9 mmHg  LVp EDP Pressure 19 mmHg  QP/QS 1  TPVR Index 21.71 HRUI  TSVR Index 45.06 HRUI  PVR SVR Ratio 0.35  TPVR/TSVR Ratio 0.48     Patient Profile     53 y.o. female admitted with acute on chronic combined systolic and diastolic biventricular heart failure, premature onset CAD (CABG 2007, DES to SVG-PDA 2017, now with known total occlusion of the SVG-PDA as well as total occlusion of the native RCA, total occlusion of the native LAD with patent LIMA to LAD, 50% stenosis in previously stented left circumflex coronary artery with high-grade stenoses and oblique marginal arteries, diffusely diseased), chronic respiratory insufficiency and tracheostomy since 2002, COPD, hypertension, hyperlipidemia, moderate to severe pulmonary artery hypertension, history of DVT and lupus anticoagulant on chronic warfarin therapy, subtherapeutic on admission.  Assessment & Plan    CHF with reduced systolic and diastolic function:  -I/O difficult, but at least 8L negative with what is charted. Weight today 202 lbs (92 kg, from 97.9 kg on admission). Weight does not appear to have changed much in 4 days, but she has had good urine output, and her exam has continued to improve, so I am unsure why her weights are not congruent with this. Prior dry weight at home was 192-197 lbs per patient. -her Cr is stable (peak 1.2, today 1.18). Encouraged activity, working with PT to mobilize fluid. -she appears to be nearlly euvolemic. Will trial oral diuretic today. She was on 40 po torsemide in the AM and 20 PO in the evening. She has been on 80 mg IV lasix here while diuresing. Will trial 80 mg oral torsemide this AM, monitor urine output to  determine PM dosing. -was on metoprolol succinate 25 mg daily as an outpatient, continue -started on losartan 25 mg this admission, BP borderline. EF is 40-45% on recent echo.  -monitoring potassium, receiving 10 mEq BID given ongoing diuresis  Leg pain: much improved with addition of AM and mid day gabapentin to nighttime gabapentin dose. Continue  History of DVT/lupus anticoagulant/chronic anticoagulation: Currently on intravenous heparin for subtherapeutic INR, reloading warfarin. INR 2.1 today  HTN: Target BP less than 130/80.  Started ARB this admission. BP running low 962E systolic, occasionally reads in the upper 80s while lying down. She is asymptomatic, but watch closely.  Appreciate PT recommendation for home heatlh PT.  No changes to plan today: PAH: Moderate to severe pulmonary artery hypertension.   -prior RHC with elevated transpulmonary gradient (61mmHg), numbers suggest intrinsic lund disease and not LV dysfunction as primary etiology. -given chronic PAH, her baseline may have chronic edema due to RV overload. -echo this admission shows elevated LAP and RVSP of 58 mmHg  CAD s/p CABG 2007:  No angina.   -known severe native and graft vessel disease, primarily receives myocardial blood flow through the LIMA to LAD bypass surgery and severely diseased left circumflex coronary system. -known native RCA and SVG-RCA are occluded -prior LVgram and current echo show severe hypokinesis of inferior/inferoseptal/inferolateral walls. - Per previous notes she does have stable angina pectoris, CCS functional class II-3 (angina when walking  around the house, resolved spontaneously without sublingual nitroglycerin).    Chronic respiratory insufficiency with hypoxia: on home O2, usually 3-4 L/minute, chronic tracheostomy, history of COPD.   -Previous ABGs suggest mild chronic hypercapnia/CO2 retaining with typical PCO2 45-50.  -Note history of PEA cardiac arrest a few months ago when she  had severe hypoxemia in the setting of a dislodged tracheostomy.  DM on insulin: Historically poorly controlled, but most recent hemoglobin A1c is 6.3%.  HLP: Most recent LDL cholesterol was 103, target less than 70.  Per notes she did not tolerate high-dose statins but was taking rosuvastatin 10 mg daily.  Restarted this admission.  +hCG: minimal increase, trending down and now borderline elevated at 5, not compatible with pregnancy. Patient denies intercourse in > 1 year.  Also has history of tubal ligation. Suspect cross-reaction false positive.  TIME SPENT WITH PATIENT: 25 minutes of direct patient care. More than 50% of that time was spent on coordination of care and counseling [id number] plans, med management plans.  Buford Dresser, MD, PhD [facility]  For questions or updates, please contact [place] Please consult [email or web address] for contact info under     Signed, Buford Dresser, MD  01/18/2019, 11:44 AM

## 2019-01-18 NOTE — Progress Notes (Signed)
ANTICOAGULATION CONSULT NOTE - Follow Up Consult  Pharmacy Consult for Heparin/Coumadin Indication: DVT/Lupus anticoagulant   Allergies  Allergen Reactions  . Penicillins Rash    Has patient had a PCN reaction causing immediate rash, facial/tongue/throat swelling, SOB or lightheadedness with hypotension: Yes Has patient had a PCN reaction causing severe rash involving mucus membranes or skin necrosis: No Has patient had a PCN reaction that required hospitalization No Has patient had a PCN reaction occurring within the last 10 years: No If all of the above answers are "NO", then may proceed with Cephalosporin use.   REACTION: rash  . Ciprofloxacin     nausea  . Ibuprofen Rash    swelling in leg     Patient Measurements: Height: 4\' 10"  (147.3 cm) Weight: 202 lb 14.4 oz (92 kg) IBW/kg (Calculated) : 40.9 Heparin Dosing Weight: 65.2 kg  Vital Signs: Temp: 98.3 F (36.8 C) (05/14 0524) Temp Source: Oral (05/14 0524) BP: 103/56 (05/14 0524) Pulse Rate: 60 (05/14 0835)  Labs: Recent Labs    01/16/19 0438 01/17/19 0504 01/18/19 0530  HGB 11.6* 12.0 12.4  HCT 35.8* 36.4 38.1  PLT 241 251 246  LABPROT 18.0* 21.5* 23.5*  INR 1.5* 1.9* 2.1*  HEPARINUNFRC 0.44 0.57 0.66  CREATININE 1.20* 1.16* 1.18*    Estimated Creatinine Clearance: 53.4 mL/min (A) (by C-G formula based on SCr of 1.18 mg/dL (H)).  Assessment:   Anticoag: H/O DVT/Lupus anticoagulant on warfarin PTA. HL 0.66  in goal. INR up to 2.1. Hgb 12.4 stable. - Coumadin 5 mg daily except 2.5 Sun at home, Admit INR 1.3 (5/8)   Goal of Therapy:  Heparin level 0.3-0.7 units/ml  INR 2-3 Monitor platelets by anticoagulation protocol: Yes   Plan:  - d/c IV heparin this AM. - Warf 6 mg tonight  - Daily HL, CBC, and INR  Ukiah Trawick S. Alford Highland, PharmD, BCPS Clinical Staff Pharmacist Eilene Ghazi Stillinger 01/18/2019,8:51 AM

## 2019-01-19 ENCOUNTER — Telehealth: Payer: Self-pay | Admitting: Adult Health

## 2019-01-19 DIAGNOSIS — E785 Hyperlipidemia, unspecified: Secondary | ICD-10-CM

## 2019-01-19 DIAGNOSIS — E1140 Type 2 diabetes mellitus with diabetic neuropathy, unspecified: Secondary | ICD-10-CM

## 2019-01-19 DIAGNOSIS — Z794 Long term (current) use of insulin: Secondary | ICD-10-CM

## 2019-01-19 DIAGNOSIS — Z951 Presence of aortocoronary bypass graft: Secondary | ICD-10-CM

## 2019-01-19 DIAGNOSIS — Z930 Tracheostomy status: Secondary | ICD-10-CM

## 2019-01-19 DIAGNOSIS — R0689 Other abnormalities of breathing: Secondary | ICD-10-CM

## 2019-01-19 LAB — BASIC METABOLIC PANEL
Anion gap: 8 (ref 5–15)
BUN: 31 mg/dL — ABNORMAL HIGH (ref 6–20)
CO2: 34 mmol/L — ABNORMAL HIGH (ref 22–32)
Calcium: 9.8 mg/dL (ref 8.9–10.3)
Chloride: 94 mmol/L — ABNORMAL LOW (ref 98–111)
Creatinine, Ser: 1.3 mg/dL — ABNORMAL HIGH (ref 0.44–1.00)
GFR calc Af Amer: 54 mL/min — ABNORMAL LOW (ref >60)
GFR calc non Af Amer: 47 mL/min — ABNORMAL LOW (ref >60)
Glucose, Bld: 175 mg/dL — ABNORMAL HIGH (ref 70–99)
Potassium: 4.7 mmol/L (ref 3.5–5.1)
Sodium: 136 mmol/L (ref 135–145)

## 2019-01-19 LAB — PROTIME-INR
INR: 2.2 — ABNORMAL HIGH (ref 0.8–1.2)
Prothrombin Time: 24.1 seconds — ABNORMAL HIGH (ref 11.4–15.2)

## 2019-01-19 LAB — GLUCOSE, CAPILLARY
Glucose-Capillary: 163 mg/dL — ABNORMAL HIGH (ref 70–99)
Glucose-Capillary: 329 mg/dL — ABNORMAL HIGH (ref 70–99)

## 2019-01-19 LAB — MAGNESIUM: Magnesium: 2 mg/dL (ref 1.7–2.4)

## 2019-01-19 MED ORDER — WARFARIN SODIUM 5 MG PO TABS: 5.0000 mg | ORAL_TABLET | Freq: Once | ORAL | Status: DC

## 2019-01-19 MED ORDER — WARFARIN SODIUM 1 MG PO TABS: 1.0000 mg | ORAL_TABLET | Freq: Every day | ORAL | 2 refills | Status: DC

## 2019-01-19 MED ORDER — TORSEMIDE 20 MG PO TABS: 80.0000 mg | ORAL_TABLET | Freq: Two times a day (BID) | ORAL | Status: DC

## 2019-01-19 MED ORDER — TORSEMIDE 20 MG PO TABS: ORAL_TABLET | ORAL | 2 refills | Status: DC

## 2019-01-19 MED ORDER — WARFARIN SODIUM 3 MG PO TABS: 6.0000 mg | ORAL_TABLET | Freq: Every day | ORAL | Status: DC

## 2019-01-19 MED ORDER — ATORVASTATIN CALCIUM 20 MG PO TABS: 20.0000 mg | ORAL_TABLET | Freq: Every day | ORAL | 3 refills | Status: DC

## 2019-01-19 MED ORDER — TORSEMIDE 20 MG PO TABS: 80.0000 mg | ORAL_TABLET | Freq: Two times a day (BID) | ORAL | 2 refills | Status: DC

## 2019-01-19 MED ORDER — ROSUVASTATIN CALCIUM 10 MG PO TABS: 10.0000 mg | ORAL_TABLET | Freq: Every day | ORAL | 2 refills | Status: DC

## 2019-01-19 MED ORDER — LOSARTAN POTASSIUM 25 MG PO TABS: 25.0000 mg | ORAL_TABLET | Freq: Every day | ORAL | 2 refills | Status: DC

## 2019-01-19 MED ORDER — GABAPENTIN 300 MG PO CAPS: ORAL_CAPSULE | ORAL | 2 refills | Status: DC

## 2019-01-19 MED ORDER — POTASSIUM CHLORIDE CRYS ER 10 MEQ PO TBCR: 10.0000 meq | EXTENDED_RELEASE_TABLET | Freq: Two times a day (BID) | ORAL | 2 refills | Status: DC

## 2019-01-19 MED FILL — POTASSIUM CHL ER M10 TABLET: 10 | 30 days supply | Qty: 60 | Fill #0 | Status: TO

## 2019-01-19 MED FILL — LOSARTAN POTASSIUM 25 MG TA: 25 | 30 days supply | Qty: 30 | Fill #0 | Status: TO

## 2019-01-19 MED FILL — GABAPENTIN 300 MG CAPSULE: 300 | 30 days supply | Qty: 120 | Fill #0 | Status: TO

## 2019-01-19 MED FILL — ATORVASTATIN CALCIUM 20 MG: 20 | 30 days supply | Qty: 30 | Fill #0 | Status: TO

## 2019-01-19 MED FILL — WARFARIN SODIUM 1 MG TABLET: 1 | 30 days supply | Qty: 30 | Fill #0 | Status: TO

## 2019-01-19 MED FILL — TORSEMIDE 20 MG TABLET: 20 | 30 days supply | Qty: 240 | Fill #0 | Status: TO

## 2019-01-19 NOTE — Progress Notes (Addendum)
Discharge instructions and medication education given with teach back. Medication delivered by pharmacy. Peripheral iv removed clean dry and intact, pressure and dressing applied. Sodium diet education given to patient with teach back and "living with heart failure" packet given to patient. pts questions and concerns answered, no further questions. Spoke with pts sister Isidore Moos and answered her questions and concerns with patient's permission. Belongings with pt: clothes, shoes, bear, cell phone and charger.

## 2019-01-19 NOTE — Progress Notes (Signed)
ANTICOAGULATION CONSULT NOTE - Follow Up Consult  Pharmacy Consult for Heparin/Coumadin Indication: DVT/Lupus anticoagulant   Allergies  Allergen Reactions  . Penicillins Rash    Has patient had a PCN reaction causing immediate rash, facial/tongue/throat swelling, SOB or lightheadedness with hypotension: Yes Has patient had a PCN reaction causing severe rash involving mucus membranes or skin necrosis: No Has patient had a PCN reaction that required hospitalization No Has patient had a PCN reaction occurring within the last 10 years: No If all of the above answers are "NO", then may proceed with Cephalosporin use.   REACTION: rash  . Ciprofloxacin     nausea  . Ibuprofen Rash    swelling in leg     Patient Measurements: Height: 4\' 10"  (147.3 cm) Weight: 201 lb 9.6 oz (91.4 kg)(a scale) IBW/kg (Calculated) : 40.9 Heparin Dosing Weight: 65.2 kg  Vital Signs: Temp: 98.4 F (36.9 C) (05/15 0431) Temp Source: Oral (05/15 0431) BP: 101/55 (05/15 0858) Pulse Rate: 68 (05/15 0904)  Labs: Recent Labs    01/17/19 0504 01/18/19 0530 01/19/19 0602  HGB 12.0 12.4  --   HCT 36.4 38.1  --   PLT 251 246  --   LABPROT 21.5* 23.5* 24.1*  INR 1.9* 2.1* 2.2*  HEPARINUNFRC 0.57 0.66  --   CREATININE 1.16* 1.18*  --     Estimated Creatinine Clearance: 53.2 mL/min (A) (by C-G formula based on SCr of 1.18 mg/dL (H)).  Assessment:   Anticoag: H/O DVT/Lupus anticoagulant on warfarin PTA. INR 2.2. Hgb 12.4 stable. Home regimen too low on admission. - Coumadin 5 mg daily except 2.5 Sun at home, Admit INR 1.3 (5/8)   Goal of Therapy:  Heparin level 0.3-0.7 units/ml  INR 2-3 Monitor platelets by anticoagulation protocol: Yes   Plan:  - Try Warf 6mg  po q 1800 - Daily INR  Theresa [name] S. Alford Highland, PharmD, BCPS Clinical Staff Pharmacist Eilene Ghazi Stillinger 01/19/2019,9:52 AM

## 2019-01-19 NOTE — Telephone Encounter (Signed)
Patient discharged today. First TOC outreach should be 01/22/19

## 2019-01-19 NOTE — Progress Notes (Signed)
Inpatient Diabetes Program Recommendations  AACE/ADA: New Consensus Statement on Inpatient Glycemic Control (2015)  Target Ranges:  Prepandial:   less than 140 mg/dL      Peak postprandial:   less than 180 mg/dL (1-2 hours)      Critically ill patients:  140 - 180 mg/dL   Results for HAIDE, KLINKER (MRN [id number]) as of 01/19/2019 09:28  Ref. Range 01/18/2019 06:01 01/18/2019 12:16 01/18/2019 16:22 01/18/2019 21:18  Glucose-Capillary Latest Ref Range: 70 - 99 mg/dL 148 (H)  2 units NOVOLOG  226 (H)  5 units NOVOLOG  170 (H)  3 units NOVOLOG  234 (H)  2 units NOVOLOG +  10 units LANTUS    Results for CRISTLE, JARED (MRN [id number]) as of 01/19/2019 09:28  Ref. Range 01/19/2019 06:06  Glucose-Capillary Latest Ref Range: 70 - 99 mg/dL 163 (H)  3 units NOVOLOG      Home DM Meds: Tresiba 30 units Daily  Current Orders: Lantus 10 units QHS      Novolog Moderate Correction Scale/ SSI (0-15 units) TID AC + HS      MD- Please consider the following in-hospital insulin adjustments:  Add Novolog Meal Coverage: Novolog 3 units TID with meals  (Please add the following Hold Parameters: Hold if pt eats <50% of meal, Hold if pt NPO)      --Will follow patient during hospitalization--  Wyn Quaker RN, MSN, CDE Diabetes Coordinator Inpatient Glycemic Control Team Team Pager: [phone] (8a-5p)

## 2019-01-19 NOTE — Telephone Encounter (Signed)
New Message    Pt has TOC appt with Jory Sims 01/31/19 @ 9:30am

## 2019-01-19 NOTE — Progress Notes (Signed)
   After completing discharge summary/orders, spoke with Pharmacist who stated patient would have to pay $47 for Crestor 10mg  daily and Lipitor would be substantially cheaper. Patient cannot afford to pay $47 for a medication at this time. She was previously on Lipitor 80mg  and reportedly had "muscle pain" with this. She is willing to try Lipitor again. Therefore, will order Lipitor 20mg  daily, which is equivalent to Crestor 10mg , instead.   Darreld Mclean, PA-C 01/19/2019 1:43 PM

## 2019-01-19 NOTE — Progress Notes (Signed)
Progress Note  Patient Name: Theresa [name] Date of Encounter: 01/19/2019  Primary Cardiologist: [name], MD   Subjective   Doing well this AM. Feet are mildly sore but much improved. Feels that legs are less heavy, able to ambulate more. Made good urine on torsemide yesterday. Amenable to discharge later today.  Inpatient Medications    Scheduled Meds: . famotidine  20 mg Oral Daily  . gabapentin  300 mg Oral BID WC  . gabapentin  600 mg Oral QHS  . insulin aspart  0-15 Units Subcutaneous TID WC  . insulin aspart  0-5 Units Subcutaneous QHS  . insulin glargine  10 Units Subcutaneous QHS  . LORazepam  0.5 mg Oral BID  . losartan  25 mg Oral Daily  . metoprolol succinate  25 mg Oral Daily  . mineral oil-hydrophilic petrolatum   Topical q morning - 10a  . potassium chloride  10 mEq Oral BID  . rosuvastatin  10 mg Oral q1800  . sertraline  50 mg Oral Daily  . sodium chloride flush  3 mL Intravenous Q12H  . tamsulosin  0.4 mg Oral Daily  . torsemide  80 mg Oral BID  . warfarin  6 mg Oral q1800  . Warfarin - Pharmacist Dosing Inpatient   Does not apply q1800   Continuous Infusions: . sodium chloride     PRN Meds: sodium chloride, acetaminophen, albuterol, ipratropium-albuterol, sodium chloride flush   Vital Signs    Vitals:   01/19/19 0433 01/19/19 0855 01/19/19 0858 01/19/19 0904  BP:  (!) 101/55 (!) 101/55   Pulse:  66 66 68  Resp:    20  Temp:      TempSrc:      SpO2:   93% 97%  Weight: 91.4 kg     Height:        Intake/Output Summary (Last 24 hours) at 01/19/2019 1018 Last data filed at 01/19/2019 0759 Gross per 24 hour  Intake 1200 ml  Output 3400 ml  Net -2200 ml   Last 3 Weights 01/19/2019 01/18/2019 01/17/2019  Weight (lbs) 201 lb 9.6 oz 202 lb 14.4 oz 203 lb  Weight (kg) 91.445 kg 92.035 kg 92.08 kg      Telemetry    NSR with PVCs, 10 beats NSVT overnight- Personally Reviewed  ECG    No new tracing since 5/8- Personally Reviewed   Physical Exam   GEN: No acute distress.  Malar erythema, diffuse psoriatic skin lesions Neck: Difficult to assess JVD given body habitus and trach collar. Cardiac: RRR, no murmurs, rubs, or gallops.  Respiratory: Clear in bilateral upper fields, did not appreciate rales today GI: Soft, nontender, mildly distended  MS: trace bilateral LE dependent edema, much improved from prior. No joint swelling. Mild tenderness. Skin: chronic psoriatic lesions, but no erythema or warmth. Nontender Neuro:  Nonfocal  Psych: Normal affect   Labs    Chemistry Recent Labs  Lab 01/12/19 2244  01/16/19 0438 01/17/19 0504 01/18/19 0530  NA  --    < > 137 136 136  K  --    < > 3.8 4.5 4.8  CL  --    < > 95* 95* 97*  CO2  --    < > 31 31 27   GLUCOSE  --    < > 171* 145* 150*  BUN  --    < > 20 25* 25*  CREATININE  --    < > 1.20* 1.16* 1.18*  CALCIUM  --    < >  9.3 9.4 9.7  PROT 7.1  --   --   --   --   ALBUMIN 3.1*  --   --   --   --   AST 23  --   --   --   --   ALT 22  --   --   --   --   ALKPHOS 121  --   --   --   --   BILITOT 1.4*  --   --   --   --   GFRNONAA  --    < > 52* 54* 53*  GFRAA  --    < > 60* >60 >60  ANIONGAP  --    < > 11 10 12    < > = values in this interval not displayed.     Hematology Recent Labs  Lab 01/16/19 0438 01/17/19 0504 01/18/19 0530  WBC 6.8 7.1 7.0  RBC 3.58* 3.66* 3.83*  HGB 11.6* 12.0 12.4  HCT 35.8* 36.4 38.1  MCV 100.0 99.5 99.5  MCH 32.4 32.8 32.4  MCHC 32.4 33.0 32.5  RDW 14.4 14.2 14.3  PLT 241 251 246    Cardiac Enzymes Recent Labs  Lab 01/12/19 2244  TROPONINI <0.03   No results for input(s): TROPIPOC in the last 168 hours.   BNP Recent Labs  Lab 01/12/19 2009  BNP 834.6*     DDimer No results for input(s): DDIMER in the last 168 hours.   Radiology    No results found.  Cardiac Studies   ECHO 01/14/2019  1. Severe hypokinesis of the left ventricular, basal-mid inferoseptal wall, inferior wall and inferolateral wall.   2. The left ventricle has mild-moderately reduced systolic function, with an ejection fraction of 40-45%. The cavity size was normal. Left ventricular diastolic Doppler parameters are consistent with pseudonormal. Elevated mean left atrial pressure.  3. The right ventricle has mildly reduced systolic function. The cavity was normal. There is no increase in right ventricular wall thickness. Right ventricular systolic pressure is moderately elevated with an estimated pressure of 58 mmHg.  4. Mitral valve regurgitation is mild to moderate by color flow Doppler. The MR jet is centrally-directed.  5. The inferior vena cava was dilated in size with >50% respiratory variability.  6. Left atrial size was moderately dilated.  7. Right atrial size was mildly dilated.  Right and left heart catheterization March 24, 2018  Severe native coronary artery disease with total occlusion of the proximal LAD, total occlusion of the proximal RCA, and patent circumflex with patent proximal stent with eccentric 50% proximal narrowing. Distal obtuse marginal branches are severely and diffusely diseased.  Bypass graft failure with total occlusion of SVG to RCA.  Patent LIMA to LAD.  Right coronary territory is supplied by collaterals from LAD and circumflex.  Left ventricular systolic dysfunction with EF 35 to 45%. Mid to distal inferior wall akinesis. Inferobasal and inferoapical wall severely hypokinetic. Left ventricular end-diastolic pressure elevated at 19 mmHg  Moderate to severe pulmonary hypertension  Diagnostic  Dominance: Right    Fick Cardiac Output 3.32 L/min  Fick Cardiac Output Index 1.84 (L/min)/BSA  RA A Wave 12 mmHg  RA V Wave 17 mmHg  RA Mean 11 mmHg  RV Systolic Pressure 70 mmHg  RV Diastolic Pressure 6 mmHg  RV EDP 15 mmHg  PA Systolic Pressure 66 mmHg  PA Diastolic Pressure 21 mmHg  PA Mean 40 mmHg  PW A Wave 20 mmHg  PW V Wave  12 mmHg  PW Mean 15 mmHg  AO Systolic Pressure 628  mmHg  AO Diastolic Pressure 63 mmHg  AO Mean 83 mmHg  LV Systolic Pressure 366 mmHg  LV Diastolic Pressure 11 mmHg  LV EDP 19 mmHg  AOp Systolic Pressure 294 mmHg  AOp Diastolic Pressure 63 mmHg  AOp Mean Pressure 82 mmHg  LVp Systolic Pressure 765 mmHg  LVp Diastolic Pressure 9 mmHg  LVp EDP Pressure 19 mmHg  QP/QS 1  TPVR Index 21.71 HRUI  TSVR Index 45.06 HRUI  PVR SVR Ratio 0.35  TPVR/TSVR Ratio 0.48     Patient Profile     53 y.o. female admitted with acute on chronic combined systolic and diastolic biventricular heart failure, premature onset CAD (CABG 2007, DES to SVG-PDA 2017, now with known total occlusion of the SVG-PDA as well as total occlusion of the native RCA, total occlusion of the native LAD with patent LIMA to LAD, 50% stenosis in previously stented left circumflex coronary artery with high-grade stenoses and oblique marginal arteries, diffusely diseased), chronic respiratory insufficiency and tracheostomy since 2002, COPD, hypertension, hyperlipidemia, moderate to severe pulmonary artery hypertension, history of DVT and lupus anticoagulant on chronic warfarin therapy, subtherapeutic on admission.  Assessment & Plan    CHF with reduced systolic and diastolic function:  -I/O difficult, but at least 10L negative with what is charted. Weight today 201 lbs (91.4 kg, from 97.9 kg on admission). Prior dry weight at home was 192-197 lbs per patient. -Encouraged activity, working with PT to mobilize fluid. Recommended for home PT. I also think she would benefit from home nursing to assist with vital signs, medication review, diet/HF teaching, and blood draws. -Her volume status is much improved. She had good urine output on the 80 mg oral torsemide. Will make this her home dose. -was on metoprolol succinate 25 mg daily as an outpatient, continue -started on losartan 25 mg this admission, BP borderline. EF is 40-45% on recent echo. No room for uptitration. -monitoring  potassium, receiving 10 mEq BID given ongoing diuresis -will need BMET on or around 5/18 -home health face to face and orders done.  Leg pain: much improved with addition of AM and mid day gabapentin to nighttime gabapentin dose. Continue at discharge.  History of DVT/lupus anticoagulant/chronic anticoagulation: Therapeutic, recommended for 6 mg nightly dosing. Dr. Trena Platt office follows her INR. Will need INR check on or around 5/18, would be beneficial if this could be drawn by home nurse with her BMET.  HTN: Started ARB this admission. BP running low 465K systolic, occasionally reads in the upper 80s while lying down. She is asymptomatic. Keep this dose, follow up BP as outpatient.  No changes to plan today: PAH: Moderate to severe pulmonary artery hypertension.   -prior RHC with elevated transpulmonary gradient (54mmHg), numbers suggest intrinsic lund disease and not LV dysfunction as primary etiology. -given chronic PAH, her baseline may have chronic edema due to RV overload. -echo this admission shows elevated LAP and RVSP of 58 mmHg  CAD s/p CABG 2007:  No angina.   -known severe native and graft vessel disease, primarily receives myocardial blood flow through the LIMA to LAD bypass surgery and severely diseased left circumflex coronary system. -known native RCA and SVG-RCA are occluded -prior LVgram and current echo show severe hypokinesis of inferior/inferoseptal/inferolateral walls. - Per previous notes she does have stable angina pectoris, CCS functional class II-3 (angina when walking around the house, resolved spontaneously without sublingual nitroglycerin).    Chronic  respiratory insufficiency with hypoxia: on home O2, usually 3-4 L/minute, chronic tracheostomy, history of COPD.   -Previous ABGs suggest mild chronic hypercapnia/CO2 retaining with typical PCO2 45-50.  -Note history of PEA cardiac arrest a few months ago when she had severe hypoxemia in the setting of a dislodged  tracheostomy.  DM on insulin: Historically poorly controlled, but most recent hemoglobin A1c is 6.3%. Plan to return to home regimen at discharge.  HLP: Most recent LDL cholesterol was 103, target less than 70.  Per notes she did not tolerate high-dose statins but was taking rosuvastatin 10 mg daily.  Restarted this admission.  +hCG: minimal increase, trending down and now borderline elevated at 5, not compatible with pregnancy. Patient denies intercourse in > 1 year.  Also has history of tubal ligation. Suspect cross-reaction false positive.  Greater than >30 mins for discharge planning.  Buford Dresser, MD, PhD [facility]  For questions or updates, please contact [place] Please consult [email or web address] for contact info under     Signed, Buford Dresser, MD  01/19/2019, 10:18 AM

## 2019-01-19 NOTE — Discharge Summary (Addendum)
Discharge Summary    Patient ID: SHAKEISHA HORINE MRN: [id number]; DOB: [birth year]  Admit date: 01/12/2019 Discharge date: 01/19/2019  Primary Care Provider: Monico Blitz, MD  Primary Cardiologist: [name], MD  Primary Electrophysiologist:  None   Discharge Diagnoses    Principal Problem:   Acute on chronic combined systolic and diastolic CHF (congestive heart failure) ([place]) Active Problems:   DM (diabetes mellitus) ([place])   Hyperlipidemia LDL goal <70   Essential hypertension   History of CABG   Lupus anticoagulant syndrome ([place])   Tracheostomy in place [facility] chronic since 2002   Pulmonary hypertension, unspecified ([place])   CAD (coronary artery disease)   Chronic respiratory insufficiency   Allergies Allergies  Allergen Reactions  . Penicillins Rash    Has patient had a PCN reaction causing immediate rash, facial/tongue/throat swelling, SOB or lightheadedness with hypotension: Yes Has patient had a PCN reaction causing severe rash involving mucus membranes or skin necrosis: No Has patient had a PCN reaction that required hospitalization No Has patient had a PCN reaction occurring within the last 10 years: No If all of the above answers are "NO", then may proceed with Cephalosporin use.   REACTION: rash  . Ciprofloxacin     nausea  . Ibuprofen Rash    swelling in leg     Diagnostic Studies/Procedures    Echocardiogram 01/13/2019:  Impressions: 1. Severe hypokinesis of the left ventricular, basal-mid inferoseptal wall, inferior wall and inferolateral wall.  2. The left ventricle has mild-moderately reduced systolic function, with an ejection fraction of 40-45%. The cavity size was normal. Left ventricular diastolic Doppler parameters are consistent with pseudonormal. Elevated mean left atrial pressure.  3. The right ventricle has mildly reduced systolic function. The cavity was normal. There is no increase in right ventricular wall thickness. Right ventricular  systolic pressure is moderately elevated with an estimated pressure of 58 mmHg.  4. Mitral valve regurgitation is mild to moderate by color flow Doppler. The MR jet is centrally-directed.  5. The inferior vena cava was dilated in size with >50% respiratory variability.  6. Left atrial size was moderately dilated.  7. Right atrial size was mildly dilated.  _____________   History of Present Illness     Ms. Talsma is a 53 year old woman with a history of CAD s/p CABG in 2007 (SVG to RCA, LIMA to LAD), chronic systolic heart failure, chronic respiratory failure due to COPD (trach-dependent since 2002) with recent hypoxic PEA arrest due to dislodged tracheostomy in 10/2018, lupus anticoagulant disorder on chronic Warfarin, hypertension, hyperlipidemia, type 2 diabetes mellitus on Insulin, who is followed by Dr. Percival Spanish. Patient was hospitalized in 10/2018 with DKA. During admission, patient suffered PEA vs VT arrest in the setting of trach care. During that hospitalization, TTE was performed and appeared to show a new severely depressed LVEF (compared to TTE in 03/2018) with inferior and inferoseptal akinesis with hypokinesis elsewhere though there was an extremely poor acoustic window that limited full assessment of LV. This was thought to be myocardial stunning vs stress cardiomyopathy due to recent arrest. She required aggressive diuresis and was discharged on diuretics. In the outpatient setting, her Lasix was recently changed to Torsemide with uptitration due to persistent weight gain. She has been on Toprol-XL 25mg  daily but no ACEi/ARB which she thinks was discontinued during her recent hospitalization.   Patient presented to the [facility] ED on 01/12/2019 for evaluation of shortness of breath, edema, and weight gain over the prior week. Patient  reportedly felt well until 1 week prior to presentation. At baseline, patient is ambulatory around the house and able to climb stairs to her home without  significant dyspnea. Despite stable dose of her oral diuretic, no other medication changes, and a reportedly low salt diet (though outpatient note mentions some fast food), patient reports she has gained weight over the last week and noted increasing edema of her lower extremities and abdomen. Her dry weight is estimated to be around 197 lbs and today she weighed 217lbs today on her home scale. She was seen today via video visit by Dr. Percival Spanish who recommended that she present to the ED for admission and IV diuresis.    She denies any new chest pain, palpitations, fevers, chills, or cough. No recent steroid injections for her Psoriasis. She reports compliance with her chronic anticoagulation.  Hospital Course     Consultants: PT  Acute on Chronic Combined CHF  Patient was admitted on 01/12/2019 for acute on chronic combined CHF as stated above. Echo showed LVEF of 40-45% with severe hypokinesis of the left ventricular, basal-mid inferoseptal wall, inferior wall, and inferolateral wall. RV systolic function was also noted to be mildly reduced and RV systolic pressure was moderately elevated at 58 mmHg. Patient was diuresed with IV Lasix with good response and then transitioned back to oral Torsemide. Serum creatinine from 1.18 to 1.30 after transitioning to Torsemide - Continue Torsemide 80mg  daily. Patient to take second dose of 80mg  as needed for weight gain >3 lbs overnight. If renal function improves, may need to increase to 80mg  twice daily. Continue K-Dur 3mEq twice daily.  - Continue home Toprol-XL 25mg  daily  - Continue Losartan 25mg  daily (started this admission).  - Will order home health PT as well as home health RN to assist with vital signs, medication review, diet/heart failure education, and blood draws. Patient will need repeat BMET through home Health RN on 01/22/2019.   CAD  Patient has known CAD s/p CABG in 2007. Primarily receives myocardial blood flow through the LIMA to LAD bypass  surgery and severely diseased left circumflex coronary system. Known native RCA and SVG-RCA are occluded. Prior LV gram and current TTE show severe hypokinesis of inferior/inferoseptal/inferolateral walls. Per previous notes, she does have stable angina pectoris, CCS functional class II-3 (angina when walking around the house, resolved spontaneously without sublingual nitroglycerin). Patient denies any angina during this admission. Continue medical management.   [place]  Patient has known moderate to severe pulmonary artery hypertension. Prior RHC in 03/2018 showed elevated transpulmonary gradient (40mmHg) which suggest intrinsic lung disease and not LV dysfunction as primary etiology. Echo this admission showed elevated left atrial pressure and RV systolic pressure of 58 mmHg. Given patient's chronic PAH, she may have chronic edemea secondary to RV overload at baseline.    Chronic Respiratory Insufficiency due to COPD with Hypoxia  Patient has chronic respiratory failure with COPD. She has been trach-dependent since 2002 and is usually on 3-4 L/minute of home O2. Previous ABGs suggest mild chronic hypercapnia/CO2 retaining with typical PCO2 45-50. Of note, patient had PEA cardiac arrest in 10/2018 when she had severe hypoxemia in the setting of a dislodged tracheostomy.   Lupus Anticoagulant Disorder on Chronic Anticoagulation with History of DVT  INR was subtherapeutic at 1.3 on admission. She was started on IV Heparin while reloading Warfarin. INR 2.2 today. Personally called and spoke with Pharmacy Eilene Ghazi, PharmD) - will discharge patient on Coumadin 6mg  to be taken in the evening. Patient  already has a prescription for 5mg  tablets at home so I will send in a prescription of 1mg  tablets for total of 6mg  daily. Dr. Trena Platt office follows her INR.  Will have home health RN recheck INR on 01/22/2019.   Hypertension  Patient was started on Losartan this admission. BP has been soft with systolic BP  in the 604'V and occasional reads in the upper 80's while lying down. She is asymptomatic with this, so will continue Losartan 25mg  daily at discharge and follow-up as outpatient.   Hyperlipdiemia  Most recent lipid panel from 03/2018: Cholesterol 167, Triglycerides 101, HDL 44, LDL 103. LDL goal <70 given CAD. Per previous notes, she has not tolerated high-dose statins in the past but was taking Rosuvastatin 10mg  daily at one point. Therefore, this was restarted this admission.   Diabetes Mellitus on Insulin  Patient's diabetes has historically been poorly controlled; however, most recent hemoglobin A1c is 6.3%. Will return to home regimen at discharge.    AKI  Serum creatinine 1.01 on admission. Baseline appears to be around 0.6 to 0.9. Serum creatinine 1.30 today, up from 1.18 yesterday. Will have home health RN recheck BMET on 01/22/2019.   Leg Pain  On 01/16/2019, patient started complaining of sharp bilateral diffuse leg pain with only brief relief with Tramadol. Electrolytes were okay. Patient has known neuropathy for which she takes Gabapentin 600mg  at bedtime at home. Additional 300mg  dose was added in the morning and afternoon to assist with pain. Will continue at discharge.  +hCG  I-stat beta hCG in the ED was elevated at 16.5. Repeat trended down but was still borderline elevated at 5. Not compatible with pregnancy. Patient denies intercourse in >1 year. She also has a history of tubal ligation. Therefore, felt to be cross-reaction false positive.  minimal increase, trending down and now borderline elevated at 5, not compatible with pregnancy.   Of note, PT recommended home health PT. Care management helped arrange home health PT and home health RN to assist with  vital signs, medication review, diet/heart failure education, and blood draws. Home health RN to recheck BMET and INR on Monday 01/22/2019.   Patient was seen and examined by Dr. Harrell Gave and determined to be stable for  discharge. Outpatient TOC follow-up has been arranged. Medications as below.  _____________  Discharge Vitals Blood pressure 127/62, pulse 66, temperature (!) 97.5 F (36.4 C), temperature source Oral, resp. rate 18, height 4\' 10"  (1.473 m), weight 91.4 kg, SpO2 99 %.  Filed Weights   01/17/19 0510 01/18/19 0524 01/19/19 0433  Weight: 92.1 kg 92 kg 91.4 kg    Labs & Radiologic Studies    CBC Recent Labs    01/17/19 0504 01/18/19 0530  WBC 7.1 7.0  HGB 12.0 12.4  HCT 36.4 38.1  MCV 99.5 99.5  PLT 251 409   Basic Metabolic Panel Recent Labs    01/18/19 0530 01/19/19 0602  NA 136 136  K 4.8 4.7  CL 97* 94*  CO2 27 34*  GLUCOSE 150* 175*  BUN 25* 31*  CREATININE 1.18* 1.30*  CALCIUM 9.7 9.8  MG  --  2.0   Liver Function Tests No results for input(s): AST, ALT, ALKPHOS, BILITOT, PROT, ALBUMIN in the last 72 hours. No results for input(s): LIPASE, AMYLASE in the last 72 hours. Cardiac Enzymes No results for input(s): CKTOTAL, CKMB, CKMBINDEX, TROPONINI in the last 72 hours. BNP Invalid input(s): POCBNP D-Dimer No results for input(s): DDIMER in the last 72 hours. Hemoglobin  A1C No results for input(s): HGBA1C in the last 72 hours. Fasting Lipid Panel No results for input(s): CHOL, HDL, LDLCALC, TRIG, CHOLHDL, LDLDIRECT in the last 72 hours. Thyroid Function Tests No results for input(s): TSH, T4TOTAL, T3FREE, THYROIDAB in the last 72 hours.  Invalid input(s): FREET3 _____________  Dg Chest Port 1 View  Result Date: 01/12/2019 CLINICAL DATA:  Shortness of Breath EXAM: PORTABLE CHEST 1 VIEW COMPARISON:  11/07/2018 FINDINGS: Tracheostomy tube is noted in satisfactory position. Gastric catheter is been removed in the interval. Cardiac shadow remains enlarged. Postsurgical changes are again noted. Vascular congestion with interstitial edema is noted. Postsurgical changes in the right lung base are seen. IMPRESSION: CHF with pulmonary edema. Electronically Signed   By:  Inez Catalina M.D.   On: 01/12/2019 21:26   Disposition   Pt is being discharged home today in good condition.  Follow-up Plans & Appointments    Follow-up Information    Lendon Colonel, NP Follow up.   Specialties:  Nurse Practitioner, Radiology, Cardiology Why:  You have a virtual follow-up visit scheduled for 01/31/2019 at 9:30am with Jory Sims, one of Dr. Rosezella [place] NPs. Contact information: [address] [place] [address] [phone]        Monico Blitz, MD Follow up.   Specialty:  Internal Medicine Why:  Please call your primary care physician's office to schedule a follow-up visit within the next 1-2 weeks.  Contact information: [place] [address] [phone]          Discharge Instructions    Diet - low sodium heart healthy   Complete by:  As directed    Increase activity slowly   Complete by:  As directed       Discharge Medications   Allergies as of 01/19/2019      Reactions   Penicillins Rash   Has patient had a PCN reaction causing immediate rash, facial/tongue/throat swelling, SOB or lightheadedness with hypotension: Yes Has patient had a PCN reaction causing severe rash involving mucus membranes or skin necrosis: No Has patient had a PCN reaction that required hospitalization No Has patient had a PCN reaction occurring within the last 10 years: No If all of the above answers are "NO", then may proceed with Cephalosporin use. REACTION: rash   Ciprofloxacin    nausea   Ibuprofen Rash   swelling in leg      Medication List    TAKE these medications   albuterol (2.5 MG/3ML) 0.083% nebulizer solution Commonly known as:  PROVENTIL Take 3 mLs (2.5 mg total) by nebulization 2 (two) times daily. What changed:    when to take this  reasons to take this   famotidine 20 MG tablet Commonly known as:  PEPCID Take 20 mg by mouth daily.   gabapentin 300 MG capsule Commonly known as:  NEURONTIN Take 1  capsule (300mg ) in the morning, 1 capsule (300mg ) in the afternoon, and 2 capsules (600mg ) at bedtime What changed:    how much to take  how to take this  when to take this  additional instructions   LORazepam 0.5 MG tablet Commonly known as:  ATIVAN Take 0.5 mg by mouth every 6 (six) hours as needed for anxiety.   losartan 25 MG tablet Commonly known as:  COZAAR Take 1 tablet (25 mg total) by mouth daily. Start taking on:  Jan 20, 2019   metoprolol succinate 25 MG 24 hr tablet Commonly known as:  TOPROL-XL Take 25 mg by  mouth daily.   nitroGLYCERIN 0.4 MG SL tablet Commonly known as:  NITROSTAT Place 0.4 mg under the tongue every 5 (five) minutes as needed for chest pain.   potassium chloride 10 MEQ tablet Commonly known as:  K-DUR Take 1 tablet (10 mEq total) by mouth 2 (two) times daily.   rosuvastatin 10 MG tablet Commonly known as:  CRESTOR Take 1 tablet (10 mg total) by mouth daily at 6 PM.   sertraline 50 MG tablet Commonly known as:  ZOLOFT Take 50 mg by mouth daily.   tamsulosin 0.4 MG Caps capsule Commonly known as:  FLOMAX Take 0.4 mg by mouth daily.   torsemide 20 MG tablet Commonly known as:  DEMADEX Take 4 tablets (80mg ) daily. Take an additional 4 tablets (80mg  daily) in the evening as needed if you have had > 3lb weight gain overnight. What changed:    how much to take  how to take this  when to take this  additional instructions   TRESIBA FLEXTOUCH [place] Inject 30 Units into the skin every morning.   warfarin 1 MG tablet Commonly known as:  COUMADIN Take 1 tablet (1 mg total) by mouth daily at 6 PM. Take 1mg  tablet in addition to the 5mg  tablet for a total of 6mg  daily. What changed:  You were already taking a medication with the same name, and this prescription was added. Make sure you understand how and when to take each.   warfarin 5 MG tablet Commonly known as:  COUMADIN Take 1 tablet (5 mg total) by mouth one time only at 6 PM.  Take 5mg  tablet in addition to the 1mg  tablet for a total of 6mg  daily. What changed:  additional instructions            Durable Medical Equipment  (From admission, onward)         Start     Ordered   01/19/19 1007  Heart failure home health orders  (Heart failure home health orders / Face to face)  Once    Comments:  Heart Failure Follow-up Care:  Verify follow-up appointments per Patient Discharge Instructions. Confirm transportation arranged. Reconcile home medications with discharge medication list. Remove discontinued medications from use. Assist patient/caregiver to manage medications using pill box. Reinforce low sodium food selection Assessments: Vital signs and oxygen saturation at each visit. Assess home environment for safety concerns, caregiver support and availability of low-sodium foods. Consult Education officer, museum, PT/OT, Dietitian, and CNA based on assessments. Perform comprehensive cardiopulmonary assessment. Notify MD for any change in condition or weight gain of 3 pounds in one day or 5 pounds in one week with symptoms. Daily Weights and Symptom Monitoring: Ensure patient has access to scales. Teach patient/caregiver to weigh daily before breakfast and after voiding using same scale and record.    Teach patient/caregiver to track weight and symptoms and when to notify Provider. Activity: Develop individualized activity plan with patient/caregiver.  She follows with Dr. Manuella Ghazi for her coumadin and will need an INR drawn and faxed on 01/22/19. One time Basic metabolic panel should also be drawn and sent to Dr. Trena Platt office on 01/22/19.  Question Answer Comment  Heart Failure Follow-up Care Or per Doctor (see comments)   Home Health Visits Set up telemonitoring equipment to monitor daily vital signs, weights and oxygen saturation   Obtain the following labs Basic Metabolic Panel   Obtain the following labs Other see comments   Lab frequency Other see comments   Fax lab  results to Other see comments   Diet Low Sodium Heart Healthy   Fluid restrictions: 1500 mL Fluid   Skilled Nurse to notify MD of weight trends weekly for first 2 weeks. May fax or call: (call) OR fax to: Other see comments   Initiate Heart Failure Clinic Diuretic Protocol to be used by [place] only ( to be ordered by Heart Failure Team Providers Only) No      01/19/19 1009            Acute coronary syndrome (MI, NSTEMI, STEMI, etc) this admission?: No.    Outstanding Labs/Studies   Recheck BMET and INR on 01/22/2019 via home health RN.  Duration of Discharge Encounter   Greater than 30 minutes including physician time.  Signed, Darreld Mclean, PA-C 01/19/2019, 1:00 PM

## 2019-01-20 DIAGNOSIS — E119 Type 2 diabetes mellitus without complications: Secondary | ICD-10-CM | POA: Diagnosis not present

## 2019-01-20 DIAGNOSIS — I110 Hypertensive heart disease with heart failure: Secondary | ICD-10-CM | POA: Diagnosis not present

## 2019-01-20 DIAGNOSIS — J441 Chronic obstructive pulmonary disease with (acute) exacerbation: Secondary | ICD-10-CM | POA: Diagnosis not present

## 2019-01-20 DIAGNOSIS — J9611 Chronic respiratory failure with hypoxia: Secondary | ICD-10-CM | POA: Diagnosis not present

## 2019-01-20 DIAGNOSIS — M6281 Muscle weakness (generalized): Secondary | ICD-10-CM | POA: Diagnosis not present

## 2019-01-20 DIAGNOSIS — I5032 Chronic diastolic (congestive) heart failure: Secondary | ICD-10-CM | POA: Diagnosis not present

## 2019-01-21 DIAGNOSIS — I110 Hypertensive heart disease with heart failure: Secondary | ICD-10-CM | POA: Diagnosis not present

## 2019-01-21 DIAGNOSIS — J9611 Chronic respiratory failure with hypoxia: Secondary | ICD-10-CM | POA: Diagnosis not present

## 2019-01-21 DIAGNOSIS — I5032 Chronic diastolic (congestive) heart failure: Secondary | ICD-10-CM | POA: Diagnosis not present

## 2019-01-21 DIAGNOSIS — J441 Chronic obstructive pulmonary disease with (acute) exacerbation: Secondary | ICD-10-CM | POA: Diagnosis not present

## 2019-01-21 DIAGNOSIS — E119 Type 2 diabetes mellitus without complications: Secondary | ICD-10-CM | POA: Diagnosis not present

## 2019-01-21 DIAGNOSIS — M6281 Muscle weakness (generalized): Secondary | ICD-10-CM | POA: Diagnosis not present

## 2019-01-22 DIAGNOSIS — I110 Hypertensive heart disease with heart failure: Secondary | ICD-10-CM | POA: Diagnosis not present

## 2019-01-22 DIAGNOSIS — E119 Type 2 diabetes mellitus without complications: Secondary | ICD-10-CM | POA: Diagnosis not present

## 2019-01-22 DIAGNOSIS — J449 Chronic obstructive pulmonary disease, unspecified: Secondary | ICD-10-CM | POA: Diagnosis not present

## 2019-01-22 DIAGNOSIS — Z9981 Dependence on supplemental oxygen: Secondary | ICD-10-CM | POA: Diagnosis not present

## 2019-01-22 DIAGNOSIS — I5040 Unspecified combined systolic (congestive) and diastolic (congestive) heart failure: Secondary | ICD-10-CM | POA: Diagnosis not present

## 2019-01-22 DIAGNOSIS — I272 Pulmonary hypertension, unspecified: Secondary | ICD-10-CM | POA: Diagnosis not present

## 2019-01-22 DIAGNOSIS — Z86718 Personal history of other venous thrombosis and embolism: Secondary | ICD-10-CM | POA: Diagnosis not present

## 2019-01-22 DIAGNOSIS — M6281 Muscle weakness (generalized): Secondary | ICD-10-CM | POA: Diagnosis not present

## 2019-01-22 DIAGNOSIS — I5032 Chronic diastolic (congestive) heart failure: Secondary | ICD-10-CM | POA: Diagnosis not present

## 2019-01-22 DIAGNOSIS — Z7901 Long term (current) use of anticoagulants: Secondary | ICD-10-CM | POA: Diagnosis not present

## 2019-01-22 DIAGNOSIS — I2510 Atherosclerotic heart disease of native coronary artery without angina pectoris: Secondary | ICD-10-CM | POA: Diagnosis not present

## 2019-01-22 DIAGNOSIS — J9611 Chronic respiratory failure with hypoxia: Secondary | ICD-10-CM | POA: Diagnosis not present

## 2019-01-22 DIAGNOSIS — Z431 Encounter for attention to gastrostomy: Secondary | ICD-10-CM | POA: Diagnosis not present

## 2019-01-22 DIAGNOSIS — Z430 Encounter for attention to tracheostomy: Secondary | ICD-10-CM | POA: Diagnosis not present

## 2019-01-22 NOTE — Telephone Encounter (Signed)
LM TO CALL BACK ./CY 

## 2019-01-23 DIAGNOSIS — I5040 Unspecified combined systolic (congestive) and diastolic (congestive) heart failure: Secondary | ICD-10-CM | POA: Diagnosis not present

## 2019-01-23 DIAGNOSIS — J449 Chronic obstructive pulmonary disease, unspecified: Secondary | ICD-10-CM | POA: Diagnosis not present

## 2019-01-23 DIAGNOSIS — I2510 Atherosclerotic heart disease of native coronary artery without angina pectoris: Secondary | ICD-10-CM | POA: Diagnosis not present

## 2019-01-23 DIAGNOSIS — I110 Hypertensive heart disease with heart failure: Secondary | ICD-10-CM | POA: Diagnosis not present

## 2019-01-23 DIAGNOSIS — E119 Type 2 diabetes mellitus without complications: Secondary | ICD-10-CM | POA: Diagnosis not present

## 2019-01-23 DIAGNOSIS — J9611 Chronic respiratory failure with hypoxia: Secondary | ICD-10-CM | POA: Diagnosis not present

## 2019-01-23 NOTE — Telephone Encounter (Signed)
Follow up ° ° °Patient is returning your call. Please call. ° ° ° °

## 2019-01-23 NOTE — Telephone Encounter (Signed)
Patient contacted regarding discharge from [facility] on 01/19/19 .  Patient understands to follow up with provider Jory Sims, DNP on 5/27 at 9:30 am at [facility] . Patient understands discharge instructions? yes Patient understands medications and regiment? yes Patient understands to bring all medications to this visit? yes

## 2019-01-26 ENCOUNTER — Telehealth: Payer: Self-pay | Admitting: Adult Health

## 2019-01-26 DIAGNOSIS — I110 Hypertensive heart disease with heart failure: Secondary | ICD-10-CM | POA: Diagnosis not present

## 2019-01-26 DIAGNOSIS — J9611 Chronic respiratory failure with hypoxia: Secondary | ICD-10-CM | POA: Diagnosis not present

## 2019-01-26 DIAGNOSIS — E119 Type 2 diabetes mellitus without complications: Secondary | ICD-10-CM | POA: Diagnosis not present

## 2019-01-26 DIAGNOSIS — I5040 Unspecified combined systolic (congestive) and diastolic (congestive) heart failure: Secondary | ICD-10-CM | POA: Diagnosis not present

## 2019-01-26 DIAGNOSIS — J449 Chronic obstructive pulmonary disease, unspecified: Secondary | ICD-10-CM | POA: Diagnosis not present

## 2019-01-26 DIAGNOSIS — I2510 Atherosclerotic heart disease of native coronary artery without angina pectoris: Secondary | ICD-10-CM | POA: Diagnosis not present

## 2019-01-26 NOTE — Telephone Encounter (Signed)
smartphone/ consent/ my chart via text/ pre reg completed  °

## 2019-01-30 DIAGNOSIS — J9611 Chronic respiratory failure with hypoxia: Secondary | ICD-10-CM | POA: Diagnosis not present

## 2019-01-30 DIAGNOSIS — J449 Chronic obstructive pulmonary disease, unspecified: Secondary | ICD-10-CM | POA: Diagnosis not present

## 2019-01-30 DIAGNOSIS — I110 Hypertensive heart disease with heart failure: Secondary | ICD-10-CM | POA: Diagnosis not present

## 2019-01-30 DIAGNOSIS — E119 Type 2 diabetes mellitus without complications: Secondary | ICD-10-CM | POA: Diagnosis not present

## 2019-01-30 DIAGNOSIS — I2510 Atherosclerotic heart disease of native coronary artery without angina pectoris: Secondary | ICD-10-CM | POA: Diagnosis not present

## 2019-01-30 DIAGNOSIS — I5040 Unspecified combined systolic (congestive) and diastolic (congestive) heart failure: Secondary | ICD-10-CM | POA: Diagnosis not present

## 2019-01-30 NOTE — Progress Notes (Signed)
Virtual Visit via Telephone Note   This visit type was conducted due to national recommendations for restrictions regarding the COVID-19 Pandemic (e.g. social distancing) in an effort to limit this patient's exposure and mitigate transmission in our community.  Due to her co-morbid illnesses, this patient is at least at moderate risk for complications without adequate follow up.  This format is felt to be most appropriate for this patient at this time.  The patient did not have access to video technology/had technical difficulties with video requiring transitioning to audio format only (telephone).  All issues noted in this document were discussed and addressed.  No physical exam could be performed with this format.  Please refer to the patient's chart for her  consent to telehealth for [facility]   Date:  01/30/2019   ID:  Theresa [name], DOB 1965-11-09, MRN [id number]  Patient Location: Home Provider Location: Home  PCP:  Monico Blitz, MD  Cardiologist:  [name], MD  Electrophysiologist:  None   Evaluation Performed:  Follow-Up Visit  Chief Complaint:  Chronic Mixed CHF  History of Present Illness:    Theresa [name] is a 53 y.o. female with we are seeing post- hospitalization, after admission for acute on chronic mixed CHF, with echocardiogram completed during hospitalization on 01/13/2019 revealing severe hypokinesis of the of the left ventricle basal mid inferior septal wall inferior wall and inferior lateral wall with ejection fraction of 40% to 05% with diastolic dysfunction, elevated PA pressures of 58 mmHg.  The patient also has a history of coronary artery disease status post CABG in 2007 with SVG to RCA, and LIMA to LAD.  She also has chronic respiratory failure due to COPD and is trach dependent since 2002, with PEA arrest related to hypoxia and dislodged tracheostomy in February 2020.  Additional history includes type 2 diabetes on insulin, hypertension, lupus  anticoagulation disorder on chronic warfarin, and hyperlipidemia.  On admission the patient presented to the ED with weight gain and lower extremity edema with worsening respiratory status.  Her dry weight was estimated to be around 197 pounds and on admission she weighed 217 pounds.  She was seen by Dr. Percival Spanish via video the same day and was advised to be admitted.  The patient was diuresed with IV Lasix and transitioned back to oral torsemide she was to continue torsemide 80 mg daily on discharge and to take an additional 80 mg as needed for weight gain greater than 3 pounds.  She was to be considered to increase torsemide to 80 mg twice daily if she continued to have to take extra doses if renal function was maintained.  She was started on losartan 25 mg daily and rosuvastatin 10 mg daily continued on home metoprolol 25 mg daily.  Home health PT and home health nurses were ordered to assist with vital signs medications and blood draws along with deconditioning.  She was to have a repeat BMET on 01/22/2019.  It was also noted that due to pulmonary elevated left atrial pressure and RV systolic pressure she will likely have chronic lower extremity edema secondary to RV overload at baseline.  She was to continue Coumadin therapy on discharge and was to follow-up with her primary care physician Dr. Manuella Ghazi for ongoing dosage adjustments and PT/INR.  She states she has had to take the extra doses of torsemide at least twice a week. She states that her weight goes up 2-3 lbs, then she takes the extra dose. She denies any  edema. She has home health measure her legs for changes and weigh her.  BP has been "okay" but she does not remember her readings.   She states her breathing "pretty good" unless the weather is very humid. She has an appointment with Dr. Manuella Ghazi, her PCP today to have INR drawn for coumadin dosing.   The patient does not have symptoms concerning for COVID-19 infection (fever, chills, cough, or new  shortness of breath).    Past Medical History:  Diagnosis Date  . Acute kidney failure with lesion of tubular necrosis (HCC)   . Acute systolic heart failure ([place])   . CAD (coronary artery disease)    a. s/p prior PCI. b. CABG 2007 at [facility] in [place] 2007. c. inferior STEMI 10/2015 s/p DES to dSVG-PDA.  . Cardiac arrest ([place])   . Cervical cancer ([place])   . Chronic diastolic CHF (congestive heart failure) ([place])   . Chronic respiratory failure ([place])    s/p tracheostomy 2002  . Chronic RUQ pain   . COPD (chronic obstructive pulmonary disease) ([place])   . Diabetes mellitus ([place])   . DVT (deep venous thrombosis) ([place])   . Endometriosis   . History of gallstones 01/2016   seen on Ultrasound  . History of HIDA scan 11/2016   normal  . HTN (hypertension)   . Hyperlipidemia   . Lupus anticoagulant disorder (HCC)    on coumadin  . Morbid obesity ([place])   . ST elevation (STEMI) myocardial infarction involving right coronary artery ([place]) 10/29/15   stent to VG to PDA  . Toe fracture, right 03/29/2018  . Tracheostomy in place [facility]), chronic since 2002 11/03/2015  . Tracheostomy status [facility])    Past Surgical History:  Procedure Laterality Date  . CARDIAC CATHETERIZATION N/A 10/29/2015   Procedure: Left Heart Cath and Cors/Grafts Angiography;  Surgeon: Burnell Blanks, MD;  Location: [place] CV LAB;  Service: Cardiovascular;  Laterality: N/A;  . CARDIAC CATHETERIZATION  10/29/2015   Procedure: Coronary Stent Intervention;  Surgeon: Burnell Blanks, MD;  Location: [place] CV LAB;  Service: Cardiovascular;;  . CAROTID STENT    . CESAREAN SECTION WITH BILATERAL TUBAL LIGATION    . CORONARY ARTERY BYPASS GRAFT  2007   2V  . IR GASTROSTOMY TUBE MOD SED  11/13/2018  . RIGHT/LEFT HEART CATH AND CORONARY/GRAFT ANGIOGRAPHY N/A 03/24/2018   Procedure: RIGHT/LEFT HEART CATH AND CORONARY/GRAFT ANGIOGRAPHY;  Surgeon: Belva Crome, MD;  Location: [place] CV LAB;   Service: Cardiovascular;  Laterality: N/A;  . TRACHEOSTOMY       No outpatient medications have been marked as taking for the 01/31/19 encounter (Appointment) with Lendon Colonel, NP.     Allergies:   Penicillins; Ciprofloxacin; and Ibuprofen   Social History   Tobacco Use  . Smoking status: Current Every Day Smoker    Packs/day: 0.75    Types: Cigarettes    Start date: 10/23/1978  . Smokeless tobacco: Never Used  . Tobacco comment: 1/2 pack - trying to cut back   Substance Use Topics  . Alcohol use: No    Alcohol/week: 0.0 standard drinks  . Drug use: No     Family Hx: The patient's family history includes Diabetes in her father and mother; Hypertension in her father and mother.  ROS:   Please see the history of present illness.    All other systems reviewed and are negative.   Prior CV studies:   The following studies were  reviewed today: Echocardiogram 01/13/2019 1. Severe hypokinesis of the left ventricular, basal-mid inferoseptal wall, inferior wall and inferolateral wall.  2. The left ventricle has mild-moderately reduced systolic function, with an ejection fraction of 40-45%. The cavity size was normal. Left ventricular diastolic Doppler parameters are consistent with pseudonormal. Elevated mean left atrial pressure.  3. The right ventricle has mildly reduced systolic function. The cavity was normal. There is no increase in right ventricular wall thickness. Right ventricular systolic pressure is moderately elevated with an estimated pressure of 58 mmHg.  4. Mitral valve regurgitation is mild to moderate by color flow Doppler. The MR jet is centrally-directed.  5. The inferior vena cava was dilated in size with >50% respiratory variability.  6. Left atrial size was moderately dilated.  7. Right atrial size was mildly dilated.  Labs/Other Tests and Data Reviewed:    EKG:  No ECG reviewed.  Recent Labs: 01/12/2019: ALT 22; B Natriuretic Peptide 834.6 01/18/2019:  Hemoglobin 12.4; Platelets 246 01/19/2019: BUN 31; Creatinine, Ser 1.30; Magnesium 2.0; Potassium 4.7; Sodium 136   Recent Lipid Panel Lab Results  Component Value Date/Time   CHOL 167 03/24/2018 04:51 AM   TRIG 101 03/24/2018 04:51 AM   HDL 44 03/24/2018 04:51 AM   CHOLHDL 3.8 03/24/2018 04:51 AM   LDLCALC 103 (H) 03/24/2018 04:51 AM    Wt Readings from Last 3 Encounters:  01/19/19 201 lb 9.6 oz (91.4 kg)  01/12/19 216 lb (98 kg)  10/19/18 225 lb 1.4 oz (102.1 kg)     Objective:    Vital Signs:  There were no vitals taken for this visit.   VITAL SIGNS:  reviewed GEN:  no acute distress NEURO:  alert and oriented x 3, no obvious focal deficit PSYCH:  normal affect  ASSESSMENT & PLAN:    1. Chronic Mixed CHF: Recent hospitalization for decompensation. She has had to take extra doses of torsemide at least twice a week, due to weight gain of 3 lbs or more. I will increase the torsemide from 80 mg daily to 80 mg BID on Tuesday and Thursdays, and keep it at 80 mg daily on all other days. She is to continue daily weights, and salt restriction.  Will see her on close follow up on a monthly basis to evaluate her and prevent re-hospitalization. Continue metoprolol 25 mg daily and potassium. Will get repeat labs on next office visit.    2. Hypertension: BP has been controlled per patient report. She is to record the BP so that we can have a record of it for her next office visit. Continue losartan.  3. CAD: Hx of CABG. No complaints of chest pain or significant fatigue. Continue medical management and secondary prevention.   4. Hypercholesterolemia   4. Hx of DVT with Lupus Anticoagulant Syndrome: She remains on coumadin and is followed by her PCP Dr.Shah for INR and dosing.   4. IDDM: Followed by PCP    COVID-19 Education: The signs and symptoms of COVID-19 were discussed with the patient and how to seek care for testing (follow up with PCP or arrange E-visit).  The importance of  social distancing was discussed today.  Time:   Today, I have spent 15 minutes with the patient with telehealth technology discussing the above problems.     Medication Adjustments/Labs and Tests Ordered: Current medicines are reviewed at length with the patient today.  Concerns regarding medicines are outlined above.   Tests Ordered: No orders of the defined types were placed  in this encounter.   Medication Changes: No orders of the defined types were placed in this encounter.   Disposition:  Follow up 1 month   Signed, Phill Myron. [place] Pugh, ANP, AACC  01/30/2019 7:41 AM    [facility]

## 2019-01-31 ENCOUNTER — Telehealth (INDEPENDENT_AMBULATORY_CARE_PROVIDER_SITE_OTHER): Payer: Medicare Other | Admitting: Adult Health

## 2019-01-31 VITALS — Ht <= 58 in | Wt 205.0 lb

## 2019-01-31 DIAGNOSIS — I5042 Chronic combined systolic (congestive) and diastolic (congestive) heart failure: Secondary | ICD-10-CM

## 2019-01-31 DIAGNOSIS — Z6841 Body Mass Index (BMI) 40.0 and over, adult: Secondary | ICD-10-CM | POA: Diagnosis not present

## 2019-01-31 DIAGNOSIS — I10 Essential (primary) hypertension: Secondary | ICD-10-CM

## 2019-01-31 DIAGNOSIS — I5030 Unspecified diastolic (congestive) heart failure: Secondary | ICD-10-CM | POA: Diagnosis not present

## 2019-01-31 DIAGNOSIS — J449 Chronic obstructive pulmonary disease, unspecified: Secondary | ICD-10-CM | POA: Diagnosis not present

## 2019-01-31 DIAGNOSIS — I82409 Acute embolism and thrombosis of unspecified deep veins of unspecified lower extremity: Secondary | ICD-10-CM | POA: Diagnosis not present

## 2019-01-31 DIAGNOSIS — I2510 Atherosclerotic heart disease of native coronary artery without angina pectoris: Secondary | ICD-10-CM

## 2019-01-31 DIAGNOSIS — D6862 Lupus anticoagulant syndrome: Secondary | ICD-10-CM

## 2019-01-31 DIAGNOSIS — E1165 Type 2 diabetes mellitus with hyperglycemia: Secondary | ICD-10-CM | POA: Diagnosis not present

## 2019-01-31 DIAGNOSIS — Z299 Encounter for prophylactic measures, unspecified: Secondary | ICD-10-CM | POA: Diagnosis not present

## 2019-01-31 NOTE — Patient Instructions (Addendum)
Medication Instructions:  TAKE TORSEMIDE 80 MG DAILY EXCEPT TUES AND THURS TAKE TWICE DAILY. MAY TAKE AN ADDITIONAL DOSE ON SATURDAY IF YOU CONTINUE TO GAIN WEIGHT If you need a refill on your cardiac medications before your next appointment, please call your pharmacy.  Testing/Procedures: Follow-Up: You will need a follow up appointment in 1 months.  You may see [name], MD or one of the following Advanced Practice Providers on your designated Care Team:  Rosaria Ferries, PA-C  Jory Sims, DNP, ANP     At [facility], you and your health needs are our priority.  As part of our continuing mission to provide you with exceptional heart care, we have created designated Provider Care Teams.  These Care Teams include your primary Cardiologist (physician) and Advanced Practice Providers (APPs -  Physician Assistants and Nurse Practitioners) who all work together to provide you with the care you need, when you need it.  Thank you for choosing [facility] at [facility]!!

## 2019-02-01 DIAGNOSIS — I110 Hypertensive heart disease with heart failure: Secondary | ICD-10-CM | POA: Diagnosis not present

## 2019-02-01 DIAGNOSIS — I2510 Atherosclerotic heart disease of native coronary artery without angina pectoris: Secondary | ICD-10-CM | POA: Diagnosis not present

## 2019-02-01 DIAGNOSIS — J449 Chronic obstructive pulmonary disease, unspecified: Secondary | ICD-10-CM | POA: Diagnosis not present

## 2019-02-01 DIAGNOSIS — I5040 Unspecified combined systolic (congestive) and diastolic (congestive) heart failure: Secondary | ICD-10-CM | POA: Diagnosis not present

## 2019-02-01 DIAGNOSIS — J9611 Chronic respiratory failure with hypoxia: Secondary | ICD-10-CM | POA: Diagnosis not present

## 2019-02-01 DIAGNOSIS — E119 Type 2 diabetes mellitus without complications: Secondary | ICD-10-CM | POA: Diagnosis not present

## 2019-02-06 DIAGNOSIS — I10 Essential (primary) hypertension: Secondary | ICD-10-CM | POA: Diagnosis not present

## 2019-02-06 DIAGNOSIS — M159 Polyosteoarthritis, unspecified: Secondary | ICD-10-CM | POA: Diagnosis not present

## 2019-02-06 DIAGNOSIS — I2510 Atherosclerotic heart disease of native coronary artery without angina pectoris: Secondary | ICD-10-CM | POA: Diagnosis not present

## 2019-02-06 DIAGNOSIS — E119 Type 2 diabetes mellitus without complications: Secondary | ICD-10-CM | POA: Diagnosis not present

## 2019-02-08 DIAGNOSIS — I110 Hypertensive heart disease with heart failure: Secondary | ICD-10-CM | POA: Diagnosis not present

## 2019-02-08 DIAGNOSIS — I5040 Unspecified combined systolic (congestive) and diastolic (congestive) heart failure: Secondary | ICD-10-CM | POA: Diagnosis not present

## 2019-02-08 DIAGNOSIS — J9611 Chronic respiratory failure with hypoxia: Secondary | ICD-10-CM | POA: Diagnosis not present

## 2019-02-08 DIAGNOSIS — J449 Chronic obstructive pulmonary disease, unspecified: Secondary | ICD-10-CM | POA: Diagnosis not present

## 2019-02-08 DIAGNOSIS — I2510 Atherosclerotic heart disease of native coronary artery without angina pectoris: Secondary | ICD-10-CM | POA: Diagnosis not present

## 2019-02-08 DIAGNOSIS — E119 Type 2 diabetes mellitus without complications: Secondary | ICD-10-CM | POA: Diagnosis not present

## 2019-02-09 DIAGNOSIS — I82409 Acute embolism and thrombosis of unspecified deep veins of unspecified lower extremity: Secondary | ICD-10-CM | POA: Diagnosis not present

## 2019-02-09 DIAGNOSIS — Z6841 Body Mass Index (BMI) 40.0 and over, adult: Secondary | ICD-10-CM | POA: Diagnosis not present

## 2019-02-09 DIAGNOSIS — Z299 Encounter for prophylactic measures, unspecified: Secondary | ICD-10-CM | POA: Diagnosis not present

## 2019-02-09 DIAGNOSIS — I10 Essential (primary) hypertension: Secondary | ICD-10-CM | POA: Diagnosis not present

## 2019-02-09 DIAGNOSIS — Z713 Dietary counseling and surveillance: Secondary | ICD-10-CM | POA: Diagnosis not present

## 2019-02-14 DIAGNOSIS — E119 Type 2 diabetes mellitus without complications: Secondary | ICD-10-CM | POA: Diagnosis not present

## 2019-02-14 DIAGNOSIS — J9611 Chronic respiratory failure with hypoxia: Secondary | ICD-10-CM | POA: Diagnosis not present

## 2019-02-14 DIAGNOSIS — I2510 Atherosclerotic heart disease of native coronary artery without angina pectoris: Secondary | ICD-10-CM | POA: Diagnosis not present

## 2019-02-14 DIAGNOSIS — J449 Chronic obstructive pulmonary disease, unspecified: Secondary | ICD-10-CM | POA: Diagnosis not present

## 2019-02-14 DIAGNOSIS — I110 Hypertensive heart disease with heart failure: Secondary | ICD-10-CM | POA: Diagnosis not present

## 2019-02-14 DIAGNOSIS — I5040 Unspecified combined systolic (congestive) and diastolic (congestive) heart failure: Secondary | ICD-10-CM | POA: Diagnosis not present

## 2019-02-21 DIAGNOSIS — Z0000 Encounter for general adult medical examination without abnormal findings: Secondary | ICD-10-CM | POA: Diagnosis not present

## 2019-02-21 DIAGNOSIS — I10 Essential (primary) hypertension: Secondary | ICD-10-CM | POA: Diagnosis not present

## 2019-02-21 DIAGNOSIS — I82409 Acute embolism and thrombosis of unspecified deep veins of unspecified lower extremity: Secondary | ICD-10-CM | POA: Diagnosis not present

## 2019-02-21 DIAGNOSIS — Z1211 Encounter for screening for malignant neoplasm of colon: Secondary | ICD-10-CM | POA: Diagnosis not present

## 2019-02-21 DIAGNOSIS — M25552 Pain in left hip: Secondary | ICD-10-CM | POA: Diagnosis not present

## 2019-02-21 DIAGNOSIS — Z1331 Encounter for screening for depression: Secondary | ICD-10-CM | POA: Diagnosis not present

## 2019-02-21 DIAGNOSIS — Z299 Encounter for prophylactic measures, unspecified: Secondary | ICD-10-CM | POA: Diagnosis not present

## 2019-02-21 DIAGNOSIS — Z6841 Body Mass Index (BMI) 40.0 and over, adult: Secondary | ICD-10-CM | POA: Diagnosis not present

## 2019-02-21 DIAGNOSIS — E1165 Type 2 diabetes mellitus with hyperglycemia: Secondary | ICD-10-CM | POA: Diagnosis not present

## 2019-02-21 DIAGNOSIS — I5030 Unspecified diastolic (congestive) heart failure: Secondary | ICD-10-CM | POA: Diagnosis not present

## 2019-02-21 DIAGNOSIS — Z7189 Other specified counseling: Secondary | ICD-10-CM | POA: Diagnosis not present

## 2019-02-21 DIAGNOSIS — Z1339 Encounter for screening examination for other mental health and behavioral disorders: Secondary | ICD-10-CM | POA: Diagnosis not present

## 2019-03-05 ENCOUNTER — Telehealth: Payer: Self-pay | Admitting: Cardiology

## 2019-03-05 NOTE — Telephone Encounter (Signed)
° ° °  COVID-19 Pre-Screening Questions:   In the past 7 to 10 days have you had a cough,  shortness of breath, headache, congestion, fever (100 or greater) body aches, chills, sore throat, or sudden loss of taste or sense of smell? no  Have you been around anyone with known Covid 19. no  Have you been around anyone who is awaiting Covid 19 test results in the past 7 to 10 days? no  Have you been around anyone who has been exposed to Covid 19, or has mentioned symptoms of Covid 19 within the past 7 to 10 days? no  If you have any concerns/questions about symptoms patients report during screening (either on the phone or at threshold). Contact the provider seeing the patient or DOD for further guidance.  If neither are available contact a member of the leadership team.    I called pt to confirm her appt for 03-06-19 with Dr Percival Spanish. Pt informed me that she would need one of her children to help bring in her Oxygen for her appt.

## 2019-03-05 NOTE — Progress Notes (Deleted)
Cardiology Office Note   Date:  03/05/2019   ID:  Theresa SCHARTZ, DOB 1966/09/01, MRN [id number]  PCP:  Monico Blitz, MD  Cardiologist:   [name], MD    No chief complaint on file.     History of Present Illness: Theresa [name] is a 53 y.o. female who presents for follow up of diastolic HF.  She has a history of two-vessel CABG with stenting prior to her CABG performed in [place] with CABG performed in [place] at [facility] in 2007.  She was hospitalized in May 2017 for acute on chronic hypoxic respiratory failure multifactorial in etiology due to acute on chronic diastolic heart failure, pneumonia, and COPD exacerbation.   She was hospitalized for an acute inferior STEMI in February 2017 and underwent drug-eluting stent placement in the distal SVG to the PDA.  She also has a history of diabetes, hypertension, hyperlipidemia, lupus anticoagulant with DVT and is on chronic anticoagulant therapy.  July 2019 she had chest pain.   She had a cath with results below.   She was managed medically.  She had a mean pulmonary pressure of 40.  She had acute on chronic HF with an EF of 50%.  She has been hospitalized a couple of time this year with respiratory failure and acute on chronic diastolic HF.   ***   She was diuresed about 27 lbs with a discharge weight of 173 lbs.  I reviewed hospital records for this visit.    Since going home she has not been weighing herself every morning but she does think that her weights at home when she got home were around 189 pounds on her scale.  She thinks she is kept her weight down and that her breathing is much better than when she went into the hospital.  She is not describing PND or orthopnea.  She still unfortunately smokes cigarettes.  She is not having any new chest pressure, neck or arm discomfort.  Is not having any new palpitations, presyncope or syncope.  She has no new edema.  She does have some chronic exertional  angina.   Past Medical History:  Diagnosis Date   Acute kidney failure with lesion of tubular necrosis (HCC)    Acute systolic heart failure (HCC)    CAD (coronary artery disease)    a. s/p prior PCI. b. CABG 2007 at [facility] in [place] 2007. c. inferior STEMI 10/2015 s/p DES to dSVG-PDA.   Cardiac arrest [facility])    Cervical cancer (HCC)    Chronic diastolic CHF (congestive heart failure) (HCC)    Chronic respiratory failure (HCC)    s/p tracheostomy 2002   Chronic RUQ pain    COPD (chronic obstructive pulmonary disease) (HCC)    Diabetes mellitus ([place])    DVT (deep venous thrombosis) ([place])    Endometriosis    History of gallstones 01/2016   seen on Ultrasound   History of HIDA scan 11/2016   normal   HTN (hypertension)    Hyperlipidemia    Lupus anticoagulant disorder (HCC)    on coumadin   Morbid obesity (HCC)    ST elevation (STEMI) myocardial infarction involving right coronary artery ([place]) 10/29/15   stent to VG to PDA   Toe fracture, right 03/29/2018   Tracheostomy in place [facility]), chronic since 2002 11/03/2015   Tracheostomy status [facility])     Past Surgical History:  Procedure Laterality Date   CARDIAC CATHETERIZATION N/A 10/29/2015  Procedure: Left Heart Cath and Cors/Grafts Angiography;  Surgeon: Burnell Blanks, MD;  Location: [place] CV LAB;  Service: Cardiovascular;  Laterality: N/A;   CARDIAC CATHETERIZATION  10/29/2015   Procedure: Coronary Stent Intervention;  Surgeon: Burnell Blanks, MD;  Location: [place] CV LAB;  Service: Cardiovascular;;   CAROTID STENT     CESAREAN SECTION WITH BILATERAL TUBAL LIGATION     CORONARY ARTERY BYPASS GRAFT  2007   2V   IR GASTROSTOMY TUBE MOD SED  11/13/2018   RIGHT/LEFT HEART CATH AND CORONARY/GRAFT ANGIOGRAPHY N/A 03/24/2018   Procedure: RIGHT/LEFT HEART CATH AND CORONARY/GRAFT ANGIOGRAPHY;  Surgeon: Belva Crome, MD;  Location: [place] CV LAB;  Service:  Cardiovascular;  Laterality: N/A;   TRACHEOSTOMY       Current Outpatient Medications  Medication Sig Dispense Refill   albuterol (PROVENTIL) (2.5 MG/3ML) 0.083% nebulizer solution Take 3 mLs (2.5 mg total) by nebulization 2 (two) times daily. (Patient taking differently: Take 2.5 mg by nebulization every 4 (four) hours as needed for wheezing. ) 75 mL 12   atorvastatin (LIPITOR) 20 MG tablet Take 1 tablet (20 mg total) by mouth daily. 30 tablet 3   famotidine (PEPCID) 20 MG tablet Take 20 mg by mouth daily.     gabapentin (NEURONTIN) 300 MG capsule Take 1 capsule (300mg ) in the morning, 1 capsule (300mg ) in the afternoon, and 2 capsules (600mg ) at bedtime 120 capsule 2   Insulin Degludec (TRESIBA FLEXTOUCH [place]) Inject 30 Units into the skin every morning.     LORazepam (ATIVAN) 0.5 MG tablet Take 0.5 mg by mouth every 6 (six) hours as needed for anxiety.      losartan (COZAAR) 25 MG tablet Take 1 tablet (25 mg total) by mouth daily. 30 tablet 2   metoprolol succinate (TOPROL-XL) 25 MG 24 hr tablet Take 25 mg by mouth daily.     nitroGLYCERIN (NITROSTAT) 0.4 MG SL tablet Place 0.4 mg under the tongue every 5 (five) minutes as needed for chest pain.     potassium chloride (K-DUR) 10 MEQ tablet Take 1 tablet (10 mEq total) by mouth 2 (two) times daily. 60 tablet 2   sertraline (ZOLOFT) 50 MG tablet Take 50 mg by mouth daily.     tamsulosin (FLOMAX) 0.4 MG CAPS capsule Take 0.4 mg by mouth daily.     torsemide (DEMADEX) 20 MG tablet Take 4 tablets (80mg ) daily. Take an additional 4 tablets (80mg  daily) in the evening as needed if you have had > 3lb weight gain overnight. 120 tablet 2   warfarin (COUMADIN) 1 MG tablet Take 1 tablet (1 mg total) by mouth daily at 6 PM. Take 1mg  tablet in addition to the 5mg  tablet for a total of 6mg  daily. 30 tablet 2   warfarin (COUMADIN) 5 MG tablet Take 1 tablet (5 mg total) by mouth one time only at 6 PM. Take 5mg  tablet in addition to the 1mg   tablet for a total of 6mg  daily.     No current facility-administered medications for this visit.     Allergies:   Penicillins, Ciprofloxacin, and Ibuprofen    ROS:  Please see the history of present illness.   Otherwise, review of systems are positive for none.   All other systems are reviewed and negative.    PHYSICAL EXAM: VS:  There were no vitals taken for this visit. , BMI There is no height or weight on file to calculate BMI. GENERAL:  Well appearing NECK:  No jugular venous distention, waveform within normal limits, carotid upstroke brisk and symmetric, no bruits, no thyromegaly, tracheostomy in place LUNGS:  Clear to auscultation bilaterally CHEST:  Well healed sternotomy scar. HEART:  PMI not displaced or sustained,S1 and S2 within normal limits, no S3, no S4, no clicks, no rubs, no murmurs ABD:  Flat, positive bowel sounds normal in frequency in pitch, no bruits, no rebound, no guarding, no midline pulsatile mass, no hepatomegaly, no splenomegaly EXT:  2 plus pulses throughout, no edema, no cyanosis no clubbing  EKG:  EKG is not ordered today.   Cardiac Cath 03/24/18  Severe native coronary artery disease with total occlusion of the proximal LAD, total occlusion of the proximal RCA, and patent circumflex with patent proximal stent with eccentric 50% proximal narrowing. Distal obtuse marginal branches are severely and diffusely diseased.  Bypass graft failure with total occlusion of SVG to RCA.  Patent LIMA to LAD.  Right coronary territory is supplied by collaterals from LAD and circumflex.  Left ventricular systolic dysfunction with EF 35 to 45%. Mid to distal inferior wall akinesis. Inferobasal and inferoapical wall severely hypokinetic. Left ventricular end-diastolic pressure elevated at 19 mmHg  Moderate to severe pulmonary hypertension  Recent Labs: 01/12/2019: ALT 22; B Natriuretic Peptide 834.6 01/18/2019: Hemoglobin 12.4; Platelets 246 01/19/2019: BUN 31;  Creatinine, Ser 1.30; Magnesium 2.0; Potassium 4.7; Sodium 136    Lipid Panel    Component Value Date/Time   CHOL 167 03/24/2018 0451   TRIG 101 03/24/2018 0451   HDL 44 03/24/2018 0451   CHOLHDL 3.8 03/24/2018 0451   VLDL 20 03/24/2018 0451   LDLCALC 103 (H) 03/24/2018 0451      Wt Readings from Last 3 Encounters:  01/31/19 205 lb (93 kg)  01/19/19 201 lb 9.6 oz (91.4 kg)  01/12/19 216 lb (98 kg)      Other studies Reviewed: Additional studies/ records that were reviewed today include: Hospital records. . Review of the above records demonstrates:  Please see elsewhere in the note.     ASSESSMENT AND PLAN:  CAD:   ***  We had a long discussion and I pulled out a model and the cath report drawings and went over her anatomy so that she has a better understanding of this.  She needs to stop her aspirin but remain on Plavix and her anticoagulant.   Essential HTN:    ***   This is being managed in the context of treating his CHFyle changes (TLC).  ACUTE ON CHRONIC SYSTOLIC AND DIASTOLIC CHF:   *** She seems to be euvolemic.  She would not be able to afford Entresto.  I am going to increase her lisinopril 20 mg twice daily and get a basic metabolic profile in 1 week.  Hyperlipidemia:    ***  This is followed by Monico Blitz, MD.  The LDL needs to be less than 70.  I will order a lipid when she comes back for labs.   Lupus anticoagulant with DVT:    She continues on warfarin.  ****   Tobacco abuse:    ***  She is trying.  I gave her instructions to call [phone]    Current medicines are reviewed at length with the patient today.  The patient does not have concerns regarding medicines.  The following changes have been made:  As above Labs/ tests ordered today include: As above.   No orders of the defined types were placed in this encounter.    Disposition:  FU with APP in 1 month.   Signed, [name], MD  03/05/2019 5:13 PM    [facility]

## 2019-03-06 ENCOUNTER — Ambulatory Visit: Payer: Medicare Other | Admitting: Cardiology

## 2019-03-07 DIAGNOSIS — Z299 Encounter for prophylactic measures, unspecified: Secondary | ICD-10-CM | POA: Diagnosis not present

## 2019-03-07 DIAGNOSIS — I10 Essential (primary) hypertension: Secondary | ICD-10-CM | POA: Diagnosis not present

## 2019-03-07 DIAGNOSIS — M25562 Pain in left knee: Secondary | ICD-10-CM | POA: Diagnosis not present

## 2019-03-07 DIAGNOSIS — L409 Psoriasis, unspecified: Secondary | ICD-10-CM | POA: Diagnosis not present

## 2019-03-07 DIAGNOSIS — I82409 Acute embolism and thrombosis of unspecified deep veins of unspecified lower extremity: Secondary | ICD-10-CM | POA: Diagnosis not present

## 2019-03-07 DIAGNOSIS — E1142 Type 2 diabetes mellitus with diabetic polyneuropathy: Secondary | ICD-10-CM | POA: Diagnosis not present

## 2019-03-07 DIAGNOSIS — E1165 Type 2 diabetes mellitus with hyperglycemia: Secondary | ICD-10-CM | POA: Diagnosis not present

## 2019-03-07 DIAGNOSIS — Z6841 Body Mass Index (BMI) 40.0 and over, adult: Secondary | ICD-10-CM | POA: Diagnosis not present

## 2019-03-07 DIAGNOSIS — I5030 Unspecified diastolic (congestive) heart failure: Secondary | ICD-10-CM | POA: Diagnosis not present

## 2019-03-08 ENCOUNTER — Telehealth: Payer: Medicare Other | Admitting: Cardiology

## 2019-03-08 DIAGNOSIS — I10 Essential (primary) hypertension: Secondary | ICD-10-CM | POA: Diagnosis not present

## 2019-03-08 DIAGNOSIS — M25562 Pain in left knee: Secondary | ICD-10-CM | POA: Diagnosis not present

## 2019-03-08 DIAGNOSIS — E119 Type 2 diabetes mellitus without complications: Secondary | ICD-10-CM | POA: Diagnosis not present

## 2019-03-08 DIAGNOSIS — I2510 Atherosclerotic heart disease of native coronary artery without angina pectoris: Secondary | ICD-10-CM | POA: Diagnosis not present

## 2019-03-08 DIAGNOSIS — M159 Polyosteoarthritis, unspecified: Secondary | ICD-10-CM | POA: Diagnosis not present

## 2019-03-08 DIAGNOSIS — M1712 Unilateral primary osteoarthritis, left knee: Secondary | ICD-10-CM | POA: Diagnosis not present

## 2019-03-12 DIAGNOSIS — I110 Hypertensive heart disease with heart failure: Secondary | ICD-10-CM | POA: Diagnosis not present

## 2019-03-14 DIAGNOSIS — R413 Other amnesia: Secondary | ICD-10-CM | POA: Diagnosis not present

## 2019-03-21 DIAGNOSIS — S42212S Unspecified displaced fracture of surgical neck of left humerus, sequela: Secondary | ICD-10-CM | POA: Diagnosis not present

## 2019-03-21 DIAGNOSIS — I82409 Acute embolism and thrombosis of unspecified deep veins of unspecified lower extremity: Secondary | ICD-10-CM | POA: Diagnosis not present

## 2019-03-21 DIAGNOSIS — Z299 Encounter for prophylactic measures, unspecified: Secondary | ICD-10-CM | POA: Diagnosis not present

## 2019-03-21 DIAGNOSIS — J449 Chronic obstructive pulmonary disease, unspecified: Secondary | ICD-10-CM | POA: Diagnosis not present

## 2019-03-21 DIAGNOSIS — E1142 Type 2 diabetes mellitus with diabetic polyneuropathy: Secondary | ICD-10-CM | POA: Diagnosis not present

## 2019-03-21 DIAGNOSIS — M79602 Pain in left arm: Secondary | ICD-10-CM | POA: Diagnosis not present

## 2019-03-21 DIAGNOSIS — S4992XA Unspecified injury of left shoulder and upper arm, initial encounter: Secondary | ICD-10-CM | POA: Diagnosis not present

## 2019-03-21 DIAGNOSIS — Z6841 Body Mass Index (BMI) 40.0 and over, adult: Secondary | ICD-10-CM | POA: Diagnosis not present

## 2019-03-22 ENCOUNTER — Ambulatory Visit (INDEPENDENT_AMBULATORY_CARE_PROVIDER_SITE_OTHER): Payer: Medicare Other | Admitting: Orthopaedic Surgery

## 2019-03-22 ENCOUNTER — Ambulatory Visit (INDEPENDENT_AMBULATORY_CARE_PROVIDER_SITE_OTHER): Payer: Medicare Other

## 2019-03-22 ENCOUNTER — Encounter: Payer: Self-pay | Admitting: Orthopaedic Surgery

## 2019-03-22 ENCOUNTER — Other Ambulatory Visit: Payer: Self-pay

## 2019-03-22 DIAGNOSIS — I80209 Phlebitis and thrombophlebitis of unspecified deep vessels of unspecified lower extremity: Secondary | ICD-10-CM | POA: Insufficient documentation

## 2019-03-22 DIAGNOSIS — L409 Psoriasis, unspecified: Secondary | ICD-10-CM | POA: Insufficient documentation

## 2019-03-22 DIAGNOSIS — M25569 Pain in unspecified knee: Secondary | ICD-10-CM | POA: Insufficient documentation

## 2019-03-22 DIAGNOSIS — M542 Cervicalgia: Secondary | ICD-10-CM | POA: Insufficient documentation

## 2019-03-22 DIAGNOSIS — S42302A Unspecified fracture of shaft of humerus, left arm, initial encounter for closed fracture: Secondary | ICD-10-CM | POA: Insufficient documentation

## 2019-03-22 DIAGNOSIS — I2510 Atherosclerotic heart disease of native coronary artery without angina pectoris: Secondary | ICD-10-CM

## 2019-03-22 DIAGNOSIS — M545 Low back pain, unspecified: Secondary | ICD-10-CM | POA: Insufficient documentation

## 2019-03-22 DIAGNOSIS — G4700 Insomnia, unspecified: Secondary | ICD-10-CM | POA: Insufficient documentation

## 2019-03-22 DIAGNOSIS — F17210 Nicotine dependence, cigarettes, uncomplicated: Secondary | ICD-10-CM | POA: Insufficient documentation

## 2019-03-22 DIAGNOSIS — Z6841 Body Mass Index (BMI) 40.0 and over, adult: Secondary | ICD-10-CM | POA: Insufficient documentation

## 2019-03-22 DIAGNOSIS — I82409 Acute embolism and thrombosis of unspecified deep veins of unspecified lower extremity: Secondary | ICD-10-CM | POA: Insufficient documentation

## 2019-03-22 DIAGNOSIS — S42332A Displaced oblique fracture of shaft of humerus, left arm, initial encounter for closed fracture: Secondary | ICD-10-CM

## 2019-03-22 DIAGNOSIS — F419 Anxiety disorder, unspecified: Secondary | ICD-10-CM | POA: Insufficient documentation

## 2019-03-22 DIAGNOSIS — Z8639 Personal history of other endocrine, nutritional and metabolic disease: Secondary | ICD-10-CM | POA: Insufficient documentation

## 2019-03-22 MED ORDER — HYDROCODONE-ACETAMINOPHEN 5-325 MG PO TABS: 1.0000 | ORAL_TABLET | ORAL | 0 refills | Status: DC | PRN

## 2019-03-22 NOTE — Progress Notes (Signed)
Office Visit Note   Patient: Theresa [name]           Date of Birth: August 26, 1966           MRN: [id number] Visit Date: 03/22/2019              Requested by: Monico Blitz, MD [address] [place],  [place] [address] PCP: Monico Blitz, MD   Assessment & Plan: Visit Diagnoses:  1. Closed displaced oblique fracture of shaft of left humerus, initial encounter     Plan: Reduction and Sarmiento brace applied for left humeral shaft fracture.  Postreduction film shows improved position and alignment.  Use of fracture brace discussed.  Recheck 3 weeks with repeat images in the brace after the brace is tightened by Dr. Lorin Mercy   Follow-Up Instructions: Return in about 3 weeks (around 04/12/2019).   Orders:  No orders of the defined types were placed in this encounter.  No orders of the defined types were placed in this encounter.     Procedures: No procedures performed   Clinical Data: No additional findings.   Subjective: Chief Complaint  Patient presents with  . Right Shoulder - Fracture    Humerus fracture 03/21/2019    HPI 53 year old female fell saw Dr.Shah yesterday and x-rays demonstrated midshaft humerus fracture short oblique, closed with angulation.  Patient has multiple medical problems including systolic diastolic heart failure, diabetes coronary artery disease previous bypass chronic oxygen use previous tracheostomy among other problems.  Review of Systems patient with extensive multisystem problems including diabetes, hypertension, CABG history, systolic failure, diastolic failure, sleep apnea, obesity, respiratory insufficiency on chronic oxygen otherwise noncontributory.   Objective: Vital Signs: BP (!) 141/88   Pulse 76   Ht 4\' 10"  (1.473 m)   Wt 200 lb (90.7 kg)   BMI 41.80 kg/m   Physical Exam Constitutional:      Appearance: She is well-developed.  HENT:     Head: Normocephalic.     Right Ear: External ear normal.     Left Ear: External ear normal.  Eyes:      Pupils: Pupils are equal, round, and reactive to light.  Neck:     Thyroid: No thyromegaly.     Trachea: No tracheal deviation.  Cardiovascular:     Rate and Rhythm: Normal rate.  Pulmonary:     Comments: Portable oxygen. Some mild dyspnea Abdominal:     Palpations: Abdomen is soft.  Skin:    General: Skin is warm and dry.  Neurological:     Mental Status: She is alert and oriented to person, place, and time.  Psychiatric:        Behavior: Behavior normal.     Ortho Exam no ecchymosis present midshaft instability of the humerus.  Sensation of fingertips is intact she is actually using her hand holding papers.  Ulnar median sensation is intact.  Specialty Comments:  No specialty comments available.  Imaging: No results found.   PMFS History: Patient Active Problem List   Diagnosis Date Noted  . Adult body mass index 50.0-59.9 ([place]) 03/22/2019  . Anxiety 03/22/2019  . Low back pain 03/22/2019  . Cigarette nicotine dependence without complication 02/63/7858  . History of vitamin D deficiency 03/22/2019  . Insomnia 03/22/2019  . Knee pain 03/22/2019  . Localized, primary osteoarthritis of lower leg, unspecified laterality 03/22/2019  . Neck pain 03/22/2019  . Psoriasis 03/22/2019  . Thrombophlebitis of deep veins of lower extremity ([place]) 03/22/2019  . Deep vein thrombosis (  [place]) 03/22/2019  . Fracture of humeral shaft, left, closed 03/22/2019  . Chronic respiratory insufficiency 01/19/2019  . CAD (coronary artery disease) 01/15/2019  . Educated About Covid-19 Virus Infection 01/12/2019  . Acute on chronic combined systolic and diastolic CHF (congestive heart failure) ([place]) 01/12/2019  . Acute on chronic respiratory failure with hypoxia ([place]) 10/24/2018  . Pulmonary hypertension, unspecified ([place])   . Acute on chronic respiratory failure with hypoxia ([place]) 08/01/2017  . Uncontrolled type 2 diabetes mellitus with hyperglycemia, with long-term current use of insulin  ([place]) 08/01/2017  . Dyspnea 07/31/2017  . Type 2 diabetes mellitus with hyperglycemia ([place]) 01/22/2016  . CAD (coronary artery disease) of artery bypass graft: PTCA/DES to distal body VG to PDA 10/29/15 11/03/2015  . Tracheostomy in place [facility]), chronic since 2002 11/03/2015  . Hypokalemia 11/03/2015  . Lupus anticoagulant syndrome ([place])   . Chronic diastolic CHF (congestive heart failure) ([place])   . Respiratory failure ([place])   . Coronary artery disease involving coronary bypass graft of native heart without angina pectoris   . OSA (obstructive sleep apnea)   . COPD exacerbation ([place])   . Tobacco abuse   . DM (diabetes mellitus) ([place]) 06/11/2009  . Hyperlipidemia LDL goal <70 06/11/2009  . DVT 06/11/2009  . History of CABG 06/11/2009  . Essential hypertension 05/27/2009  . CAD, NATIVE VESSEL 05/27/2009   Past Medical History:  Diagnosis Date  . Acute kidney failure with lesion of tubular necrosis (HCC)   . Acute systolic heart failure ([place])   . CAD (coronary artery disease)    a. s/p prior PCI. b. CABG 2007 at [facility] in [place] 2007. c. inferior STEMI 10/2015 s/p DES to dSVG-PDA.  . Cardiac arrest ([place])   . Cervical cancer ([place])   . Chronic diastolic CHF (congestive heart failure) ([place])   . Chronic respiratory failure ([place])    s/p tracheostomy 2002  . Chronic RUQ pain   . COPD (chronic obstructive pulmonary disease) ([place])   . Diabetes mellitus ([place])   . DVT (deep venous thrombosis) ([place])   . Endometriosis   . History of gallstones 01/2016   seen on Ultrasound  . History of HIDA scan 11/2016   normal  . HTN (hypertension)   . Hyperlipidemia   . Lupus anticoagulant disorder (HCC)    on coumadin  . Morbid obesity ([place])   . ST elevation (STEMI) myocardial infarction involving right coronary artery ([place]) 10/29/15   stent to VG to PDA  . Toe fracture, right 03/29/2018  . Tracheostomy in place [facility]), chronic since 2002 11/03/2015  . Tracheostomy status ([place])      Family History  Problem Relation Age of Onset  . Hypertension Mother   . Diabetes Mother   . Hypertension Father   . Diabetes Father     Past Surgical History:  Procedure Laterality Date  . CARDIAC CATHETERIZATION N/A 10/29/2015   Procedure: Left Heart Cath and Cors/Grafts Angiography;  Surgeon: Burnell Blanks, MD;  Location: [place] CV LAB;  Service: Cardiovascular;  Laterality: N/A;  . CARDIAC CATHETERIZATION  10/29/2015   Procedure: Coronary Stent Intervention;  Surgeon: Burnell Blanks, MD;  Location: [place] CV LAB;  Service: Cardiovascular;;  . CAROTID STENT    . CESAREAN SECTION WITH BILATERAL TUBAL LIGATION    . CORONARY ARTERY BYPASS GRAFT  2007   2V  . IR GASTROSTOMY TUBE MOD SED  11/13/2018  . RIGHT/LEFT HEART CATH AND CORONARY/GRAFT ANGIOGRAPHY N/A 03/24/2018   Procedure: RIGHT/LEFT  HEART CATH AND CORONARY/GRAFT ANGIOGRAPHY;  Surgeon: Belva Crome, MD;  Location: [place] CV LAB;  Service: Cardiovascular;  Laterality: N/A;  . TRACHEOSTOMY     Social History   Occupational History  . Not on file  Tobacco Use  . Smoking status: Current Every Day Smoker    Packs/day: 0.75    Types: Cigarettes    Start date: 10/23/1978  . Smokeless tobacco: Never Used  . Tobacco comment: 1/2 pack - trying to cut back   Substance and Sexual Activity  . Alcohol use: No    Alcohol/week: 0.0 standard drinks  . Drug use: No  . Sexual activity: Not on file

## 2019-03-26 ENCOUNTER — Telehealth: Payer: Self-pay

## 2019-03-26 NOTE — Telephone Encounter (Signed)
7-21 J[facility] APPT @140PM  PT IS IN [place], [place] 5 HOURS AWAY- SHE STATES THAT SHE IS UNSURE WHEN SHE WILL BE BACK SHE HAD A FAMILY EMERGENCY. WILL CALL BACK 7-21 AM AFTER DISCUSSING THIS WITH J[facility].

## 2019-03-26 NOTE — Progress Notes (Signed)
Virtual Visit via Telephone Note   This visit type was conducted due to national recommendations for restrictions regarding the COVID-19 Pandemic (e.g. social distancing) in an effort to limit this patient's exposure and mitigate transmission in our community.  Due to her co-morbid illnesses, this patient is at least at moderate risk for complications without adequate follow up.  This format is felt to be most appropriate for this patient at this time.  The patient did not have access to video technology/had technical difficulties with video requiring transitioning to audio format only (telephone).  All issues noted in this document were discussed and addressed.  No physical exam could be performed with this format.  Please refer to the patient's chart for her  consent to telehealth for [facility].   Date:  03/27/2019   ID:  Theresa [name], DOB 15-Jul-1966, MRN [id number]  Patient Location: Home Provider Location: Home  PCP:  Monico Blitz, MD  Cardiologist:  [name], MD  Electrophysiologist:  None   Evaluation Performed:  Follow-Up Visit  Chief Complaint:  Follow up.   History of Present Illness:    Theresa [name] is a 53 y.o. female  who presents for follow up of diastolic HF.  She has a history of two-vessel CABG with stenting prior to her CABG performed in [place] with CABG performed in [place] at [facility] in 2007.  She was hospitalized in May 2017 for acute on chronic hypoxic respiratory failure multifactorial in etiology due to acute on chronic diastolic heart failure, pneumonia, and COPD exacerbation.   She was hospitalized for an acute inferior STEMI in February 2017 and underwent drug-eluting stent placement in the distal SVG to the PDA.  She also has a history of diabetes, hypertension, hyperlipidemia, lupus anticoagulant with DVT and is on chronic anticoagulant therapy.  July 2019 she had chest pain.    She was managed medically.  She had a mean  pulmonary pressure of 40.  She had acute on chronic HF with an EF of 50%.   She had PEA arrest due to dislodged tracheostomy in 10/2018.    The patient presented to the [facility] ED on 01/12/2019 for evaluation of shortness of breath, edema, and weight gain over the prior week. She was admitted with volume overload and had IV diuresis.   Since she was last seen she thinks she is done relatively well.  Her weights have been going down and she said she was 194 recently.  This would be the lowest we have seen in a while.  She does wear her oxygen around-the-clock.  However, she thinks she is doing a little more housework than she was.  She is not as short of breath.  She is not having any cough fevers or chills.  She has no PND or orthopnea.  He has no palpitations, presyncope or syncope.  She is not describing chest pain.  She is actually visiting family in [place] right now.  The patient does not have symptoms concerning for COVID-19 infection (fever, chills, cough, or new shortness of breath).    Past Medical History:  Diagnosis Date  . Acute kidney failure with lesion of tubular necrosis (HCC)   . Acute systolic heart failure ([place])   . CAD (coronary artery disease)    a. s/p prior PCI. b. CABG 2007 at [facility] in [place] 2007. c. inferior STEMI 10/2015 s/p DES to dSVG-PDA.  . Cardiac arrest ([place])   . Cervical cancer (  [place])   . Chronic diastolic CHF (congestive heart failure) ([place])   . Chronic respiratory failure ([place])    s/p tracheostomy 2002  . Chronic RUQ pain   . COPD (chronic obstructive pulmonary disease) ([place])   . Diabetes mellitus ([place])   . DVT (deep venous thrombosis) ([place])   . Endometriosis   . History of gallstones 01/2016   seen on Ultrasound  . History of HIDA scan 11/2016   normal  . HTN (hypertension)   . Hyperlipidemia   . Lupus anticoagulant disorder (HCC)    on coumadin  . Morbid obesity ([place])   . ST elevation (STEMI) myocardial infarction involving  right coronary artery ([place]) 10/29/15   stent to VG to PDA  . Toe fracture, right 03/29/2018  . Tracheostomy in place [facility]), chronic since 2002 11/03/2015  . Tracheostomy status [facility])    Past Surgical History:  Procedure Laterality Date  . CARDIAC CATHETERIZATION N/A 10/29/2015   Procedure: Left Heart Cath and Cors/Grafts Angiography;  Surgeon: Burnell Blanks, MD;  Location: [place] CV LAB;  Service: Cardiovascular;  Laterality: N/A;  . CARDIAC CATHETERIZATION  10/29/2015   Procedure: Coronary Stent Intervention;  Surgeon: Burnell Blanks, MD;  Location: [place] CV LAB;  Service: Cardiovascular;;  . CAROTID STENT    . CESAREAN SECTION WITH BILATERAL TUBAL LIGATION    . CORONARY ARTERY BYPASS GRAFT  2007   2V  . IR GASTROSTOMY TUBE MOD SED  11/13/2018  . RIGHT/LEFT HEART CATH AND CORONARY/GRAFT ANGIOGRAPHY N/A 03/24/2018   Procedure: RIGHT/LEFT HEART CATH AND CORONARY/GRAFT ANGIOGRAPHY;  Surgeon: Belva Crome, MD;  Location: [place] CV LAB;  Service: Cardiovascular;  Laterality: N/A;  . TRACHEOSTOMY     Prior to Admission medications   Medication Sig Start Date End Date Taking? Authorizing Provider  albuterol (PROVENTIL) (2.5 MG/3ML) 0.083% nebulizer solution Take 3 mLs (2.5 mg total) by nebulization 2 (two) times daily. Patient taking differently: Take 2.5 mg by nebulization every 4 (four) hours as needed for wheezing.  10/19/18  Yes Erick Colace, NP  atorvastatin (LIPITOR) 20 MG tablet Take 1 tablet (20 mg total) by mouth daily. 01/19/19 01/19/20 Yes Goodrich, Callie E, PA-C  famotidine (PEPCID) 20 MG tablet Take 20 mg by mouth daily.   Yes [provider]  gabapentin (NEURONTIN) 300 MG capsule Take 1 capsule (300mg ) in the morning, 1 capsule (300mg ) in the afternoon, and 2 capsules (600mg ) at bedtime 01/19/19  Yes Sande Rives E, PA-C  HYDROcodone-acetaminophen (NORCO/VICODIN) 5-325 MG tablet Take 1 tablet by mouth every 4 (four) hours as needed for  moderate pain. 03/22/19  Yes Marybelle Killings, MD  Insulin Degludec (TRESIBA FLEXTOUCH [place]) Inject 30 Units into the skin every morning.   Yes [provider]  LORazepam (ATIVAN) 0.5 MG tablet Take 0.5 mg by mouth every 6 (six) hours as needed for anxiety.    Yes [provider]  losartan (COZAAR) 25 MG tablet Take 1 tablet (25 mg total) by mouth daily. 01/20/19  Yes Sande Rives E, PA-C  metoprolol succinate (TOPROL-XL) 25 MG 24 hr tablet Take 25 mg by mouth daily.   Yes [provider]  nitroGLYCERIN (NITROSTAT) 0.4 MG SL tablet Place 0.4 mg under the tongue every 5 (five) minutes as needed for chest pain.   Yes [provider]  potassium chloride (K-DUR) 10 MEQ tablet Take 1 tablet (10 mEq total) by mouth 2 (two) times daily. 01/19/19  Yes Sande Rives E, PA-C  sertraline (  ZOLOFT) 50 MG tablet Take 50 mg by mouth daily.   Yes [provider]  tamsulosin (FLOMAX) 0.4 MG CAPS capsule Take 0.4 mg by mouth daily.   Yes [provider]  torsemide (DEMADEX) 20 MG tablet Take 4 tablets (80mg ) daily. Take an additional 4 tablets (80mg  daily) in the evening as needed if you have had > 3lb weight gain overnight. 01/19/19  Yes Sande Rives E, PA-C  warfarin (COUMADIN) 1 MG tablet Take 1 tablet (1 mg total) by mouth daily at 6 PM. Take 1mg  tablet in addition to the 5mg  tablet for a total of 6mg  daily. 01/19/19  Yes Sande Rives E, PA-C  warfarin (COUMADIN) 5 MG tablet Take 1 tablet (5 mg total) by mouth one time only at 6 PM. Take 5mg  tablet in addition to the 1mg  tablet for a total of 6mg  daily. 01/19/19  Yes Sande Rives E, PA-C     Allergies:   Penicillins, Ciprofloxacin, and Ibuprofen   Social History   Tobacco Use  . Smoking status: Current Every Day Smoker    Packs/day: 0.75    Types: Cigarettes    Start date: 10/23/1978  . Smokeless tobacco: Never Used  . Tobacco comment: 1/2 pack - trying to cut back   Substance Use Topics  .  Alcohol use: No    Alcohol/week: 0.0 standard drinks  . Drug use: No     Family Hx: The patient's family history includes Diabetes in her father and mother; Hypertension in her father and mother.  ROS:   Please see the history of present illness.    As stated in the HPI and negative for all other systems.   Prior CV studies:   The following studies were reviewed today:  None  Labs/Other Tests and Data Reviewed:    EKG:  No ECG reviewed.  Recent Labs: 01/12/2019: ALT 22; B Natriuretic Peptide 834.6 01/18/2019: Hemoglobin 12.4; Platelets 246 01/19/2019: BUN 31; Creatinine, Ser 1.30; Magnesium 2.0; Potassium 4.7; Sodium 136   Recent Lipid Panel Lab Results  Component Value Date/Time   CHOL 167 03/24/2018 04:51 AM   TRIG 101 03/24/2018 04:51 AM   HDL 44 03/24/2018 04:51 AM   CHOLHDL 3.8 03/24/2018 04:51 AM   LDLCALC 103 (H) 03/24/2018 04:51 AM    Wt Readings from Last 3 Encounters:  03/27/19 194 lb 9.6 oz (88.3 kg)  03/22/19 200 lb (90.7 kg)  01/31/19 205 lb (93 kg)     Objective:    Vital Signs:  Ht 4\' 10"  (1.473 m)   Wt 194 lb 9.6 oz (88.3 kg)   BMI 40.67 kg/m    VITAL SIGNS:  reviewed  ASSESSMENT & PLAN:    Chronic Mixed CHF:    She seems to be euvolemic by history.  She is doing well with the meds as listed.  She has very close follow-up by her primary team.  Although I do not have results of recent labs she says they have been done in her primary office.  No change in therapy is indicated.   EF was actually slightly higher at 40 to 45% recently.  Hypertension:    She does not have way of checking her blood pressure where she is now.  Typically has been well controlled managed in the context of treating her heart failure.  CAD: Hx of CABG.    She is having no active chest pain.  No further cardiovascular testing is suggested.  Hx of DVT with Lupus Anticoagulant Syndrome:  She remains on anticoagulation and tolerates this.    IDDM: This is followed by  her primary team.   COVID-19 Education: The signs and symptoms of COVID-19 were discussed with the patient and how to seek care for testing (follow up with PCP or arrange E-visit).  The importance of social distancing was discussed today.  Time:   Today, I have spent 16 minutes with the patient with telehealth technology discussing the above problems.     Medication Adjustments/Labs and Tests Ordered: Current medicines are reviewed at length with the patient today.  Concerns regarding medicines are outlined above.   Tests Ordered: No orders of the defined types were placed in this encounter.   Medication Changes: No orders of the defined types were placed in this encounter.   Follow Up:  In Person in 4 months.   Signed, [name], MD  03/27/2019 1:59 PM    [place]

## 2019-03-27 ENCOUNTER — Encounter: Payer: Self-pay | Admitting: Cardiology

## 2019-03-27 ENCOUNTER — Telehealth (INDEPENDENT_AMBULATORY_CARE_PROVIDER_SITE_OTHER): Payer: Medicare Other | Admitting: Cardiology

## 2019-03-27 VITALS — Ht <= 58 in | Wt 194.6 lb

## 2019-03-27 DIAGNOSIS — D6862 Lupus anticoagulant syndrome: Secondary | ICD-10-CM

## 2019-03-27 DIAGNOSIS — E785 Hyperlipidemia, unspecified: Secondary | ICD-10-CM

## 2019-03-27 DIAGNOSIS — I5042 Chronic combined systolic (congestive) and diastolic (congestive) heart failure: Secondary | ICD-10-CM | POA: Diagnosis not present

## 2019-03-27 DIAGNOSIS — I2510 Atherosclerotic heart disease of native coronary artery without angina pectoris: Secondary | ICD-10-CM

## 2019-03-27 NOTE — Patient Instructions (Signed)
Follow-Up: You will need a follow up appointment in 4 months-07-27-2019 @120PM . You may see [name], MD or one of the following Advanced Practice Providers on your designated Care Team:  Rosaria Ferries, PA-C  Jory Sims, DNP, ANP       Medication Instructions:  The current medical regimen is effective;  continue present plan and medications as directed. Please refer to the Current Medication list given to you today. If you need a refill on your cardiac medications before your next appointment, please call your pharmacy. Labwork: When you have labs (blood work) and your tests are completely normal, you will receive your results ONLY by [place] (if you have MyChart) -OR- A paper copy in the mail.  At [facility], you and your health needs are our priority.  As part of our continuing mission to provide you with exceptional heart care, we have created designated Provider Care Teams.  These Care Teams include your primary Cardiologist (physician) and Advanced Practice Providers (APPs -  Physician Assistants and Nurse Practitioners) who all work together to provide you with the care you need, when you need it.  Thank you for choosing [facility] at [facility]!!

## 2019-03-27 NOTE — Telephone Encounter (Signed)
S/W PT OK PER J[facility] TO HAVE VIRTUAL APPT TODAY

## 2019-04-05 DIAGNOSIS — I10 Essential (primary) hypertension: Secondary | ICD-10-CM | POA: Diagnosis not present

## 2019-04-05 DIAGNOSIS — E1142 Type 2 diabetes mellitus with diabetic polyneuropathy: Secondary | ICD-10-CM | POA: Diagnosis not present

## 2019-04-05 DIAGNOSIS — Z6841 Body Mass Index (BMI) 40.0 and over, adult: Secondary | ICD-10-CM | POA: Diagnosis not present

## 2019-04-05 DIAGNOSIS — J449 Chronic obstructive pulmonary disease, unspecified: Secondary | ICD-10-CM | POA: Diagnosis not present

## 2019-04-05 DIAGNOSIS — R413 Other amnesia: Secondary | ICD-10-CM | POA: Diagnosis not present

## 2019-04-05 DIAGNOSIS — Z299 Encounter for prophylactic measures, unspecified: Secondary | ICD-10-CM | POA: Diagnosis not present

## 2019-04-05 DIAGNOSIS — I82409 Acute embolism and thrombosis of unspecified deep veins of unspecified lower extremity: Secondary | ICD-10-CM | POA: Diagnosis not present

## 2019-04-10 DIAGNOSIS — I2510 Atherosclerotic heart disease of native coronary artery without angina pectoris: Secondary | ICD-10-CM | POA: Diagnosis not present

## 2019-04-10 DIAGNOSIS — M159 Polyosteoarthritis, unspecified: Secondary | ICD-10-CM | POA: Diagnosis not present

## 2019-04-10 DIAGNOSIS — E119 Type 2 diabetes mellitus without complications: Secondary | ICD-10-CM | POA: Diagnosis not present

## 2019-04-10 DIAGNOSIS — Z299 Encounter for prophylactic measures, unspecified: Secondary | ICD-10-CM | POA: Diagnosis not present

## 2019-04-10 DIAGNOSIS — I10 Essential (primary) hypertension: Secondary | ICD-10-CM | POA: Diagnosis not present

## 2019-04-17 ENCOUNTER — Other Ambulatory Visit: Payer: Self-pay

## 2019-04-17 DIAGNOSIS — R279 Unspecified lack of coordination: Secondary | ICD-10-CM | POA: Diagnosis not present

## 2019-04-17 DIAGNOSIS — Z299 Encounter for prophylactic measures, unspecified: Secondary | ICD-10-CM | POA: Diagnosis not present

## 2019-04-17 DIAGNOSIS — I83893 Varicose veins of bilateral lower extremities with other complications: Secondary | ICD-10-CM

## 2019-04-17 DIAGNOSIS — Z6841 Body Mass Index (BMI) 40.0 and over, adult: Secondary | ICD-10-CM | POA: Diagnosis not present

## 2019-04-17 DIAGNOSIS — R413 Other amnesia: Secondary | ICD-10-CM | POA: Diagnosis not present

## 2019-04-17 DIAGNOSIS — I10 Essential (primary) hypertension: Secondary | ICD-10-CM | POA: Diagnosis not present

## 2019-04-17 DIAGNOSIS — J441 Chronic obstructive pulmonary disease with (acute) exacerbation: Secondary | ICD-10-CM | POA: Diagnosis not present

## 2019-04-17 DIAGNOSIS — R109 Unspecified abdominal pain: Secondary | ICD-10-CM | POA: Diagnosis not present

## 2019-04-17 DIAGNOSIS — J449 Chronic obstructive pulmonary disease, unspecified: Secondary | ICD-10-CM | POA: Diagnosis not present

## 2019-04-17 DIAGNOSIS — K5900 Constipation, unspecified: Secondary | ICD-10-CM | POA: Diagnosis not present

## 2019-04-19 ENCOUNTER — Ambulatory Visit (INDEPENDENT_AMBULATORY_CARE_PROVIDER_SITE_OTHER): Payer: Medicare Other | Admitting: Orthopaedic Surgery

## 2019-04-19 ENCOUNTER — Other Ambulatory Visit: Payer: Self-pay

## 2019-04-19 ENCOUNTER — Ambulatory Visit (INDEPENDENT_AMBULATORY_CARE_PROVIDER_SITE_OTHER): Payer: Medicare Other

## 2019-04-19 ENCOUNTER — Encounter: Payer: Self-pay | Admitting: Orthopaedic Surgery

## 2019-04-19 DIAGNOSIS — I2510 Atherosclerotic heart disease of native coronary artery without angina pectoris: Secondary | ICD-10-CM

## 2019-04-19 DIAGNOSIS — S42332D Displaced oblique fracture of shaft of humerus, left arm, subsequent encounter for fracture with routine healing: Secondary | ICD-10-CM

## 2019-04-19 NOTE — Progress Notes (Signed)
Office Visit Note   Patient: Theresa [name]           Date of Birth: 1966/05/07           MRN: [id number] Visit Date: 04/19/2019              Requested by: Monico Blitz, MD [address] [place],  [place] [address] PCP: Monico Blitz, MD   Assessment & Plan: Visit Diagnoses:  1. Closed displaced oblique fracture of shaft of left humerus with routine healing, subsequent encounter 03/21/2019   2. Closed displaced oblique fracture of shaft of left humerus with routine healing, subsequent encounter    3.     Noncompliance with fracture bracing  Plan: X-rays show no callus formation.  She has been taking off her brace daily we had a long discussion about the importance of following instructions and being compliant otherwise she will end up with a painful nonunion.  She is not a good surgical candidate due to her multiple medical problems, portable oxygen etc.  She promised to keep her brace on and will return in 5 weeks with x-rays in the brace.  Follow-Up Instructions: No follow-ups on file.   Orders:  Orders Placed This Encounter  Procedures  . XR Humerus Left   No orders of the defined types were placed in this encounter.     Procedures: No procedures performed   Clinical Data: No additional findings.   Subjective: Chief Complaint  Patient presents with  . Right Shoulder - Fracture    03/21/2019    HPI 53 year old female on portable oxygen multiple medical problems returns with left repeat fracture through humeral shaft site of old fracture.  Patient's been noncompliant states she has been taking the brace over every night to take a shower.  There is motion at the fracture site.  She is wearing the [place] over the top of her blouse.  Review of Systems unchanged with multisystem involvement see 03/22/2019 office visit.   Objective: Vital Signs: There were no vitals taken for this visit.  Physical Exam unchanged  Ortho Exam motion is noted at the fracture site with pain.   Less mobile than last month.  Sensation in her hand is intact.  Specialty Comments:  No specialty comments available.  Imaging: Xr Humerus Left  Result Date: 04/19/2019 2 view x-rays left humerus demonstrates refracture through the old fracture site without evidence of callus formation in comparison to 03/22/2019 images. Impression: persistent fracture proximal humerus shaft without evidence of radiographic union.    PMFS History: Patient Active Problem List   Diagnosis Date Noted  . Adult body mass index 50.0-59.9 ([place]) 03/22/2019  . Anxiety 03/22/2019  . Low back pain 03/22/2019  . Cigarette nicotine dependence without complication 35/45/6256  . History of vitamin D deficiency 03/22/2019  . Insomnia 03/22/2019  . Knee pain 03/22/2019  . Localized, primary osteoarthritis of lower leg, unspecified laterality 03/22/2019  . Neck pain 03/22/2019  . Psoriasis 03/22/2019  . Thrombophlebitis of deep veins of lower extremity ([place]) 03/22/2019  . Deep vein thrombosis ([place]) 03/22/2019  . Fracture of humeral shaft, left, closed 03/22/2019  . Chronic respiratory insufficiency 01/19/2019  . CAD (coronary artery disease) 01/15/2019  . Educated About Covid-19 Virus Infection 01/12/2019  . Acute on chronic combined systolic and diastolic CHF (congestive heart failure) ([place]) 01/12/2019  . Acute on chronic respiratory failure with hypoxia ([place]) 10/24/2018  . Pulmonary hypertension, unspecified ([place])   . Acute on chronic respiratory failure with hypoxia (  [place]) 08/01/2017  . Uncontrolled type 2 diabetes mellitus with hyperglycemia, with long-term current use of insulin ([place]) 08/01/2017  . Dyspnea 07/31/2017  . Type 2 diabetes mellitus with hyperglycemia ([place]) 01/22/2016  . CAD (coronary artery disease) of artery bypass graft: PTCA/DES to distal body VG to PDA 10/29/15 11/03/2015  . Tracheostomy in place [facility]), chronic since 2002 11/03/2015  . Hypokalemia 11/03/2015  . Lupus anticoagulant syndrome  ([place])   . Chronic diastolic CHF (congestive heart failure) ([place])   . Respiratory failure ([place])   . Coronary artery disease involving coronary bypass graft of native heart without angina pectoris   . OSA (obstructive sleep apnea)   . COPD exacerbation ([place])   . Tobacco abuse   . DM (diabetes mellitus) ([place]) 06/11/2009  . Hyperlipidemia LDL goal <70 06/11/2009  . DVT 06/11/2009  . History of CABG 06/11/2009  . Essential hypertension 05/27/2009  . CAD, NATIVE VESSEL 05/27/2009   Past Medical History:  Diagnosis Date  . Acute kidney failure with lesion of tubular necrosis (HCC)   . Acute systolic heart failure ([place])   . CAD (coronary artery disease)    a. s/p prior PCI. b. CABG 2007 at [facility] in [place] 2007. c. inferior STEMI 10/2015 s/p DES to dSVG-PDA.  . Cardiac arrest ([place])   . Cervical cancer ([place])   . Chronic diastolic CHF (congestive heart failure) ([place])   . Chronic respiratory failure ([place])    s/p tracheostomy 2002  . Chronic RUQ pain   . COPD (chronic obstructive pulmonary disease) ([place])   . Diabetes mellitus ([place])   . DVT (deep venous thrombosis) ([place])   . Endometriosis   . History of gallstones 01/2016   seen on Ultrasound  . History of HIDA scan 11/2016   normal  . HTN (hypertension)   . Hyperlipidemia   . Lupus anticoagulant disorder (HCC)    on coumadin  . Morbid obesity ([place])   . ST elevation (STEMI) myocardial infarction involving right coronary artery ([place]) 10/29/15   stent to VG to PDA  . Toe fracture, right 03/29/2018  . Tracheostomy in place [facility]), chronic since 2002 11/03/2015  . Tracheostomy status ([place])     Family History  Problem Relation Age of Onset  . Hypertension Mother   . Diabetes Mother   . Hypertension Father   . Diabetes Father     Past Surgical History:  Procedure Laterality Date  . CARDIAC CATHETERIZATION N/A 10/29/2015   Procedure: Left Heart Cath and Cors/Grafts Angiography;  Surgeon: Burnell Blanks, MD;   Location: [place] [facility];  Service: Cardiovascular;  Laterality: N/A;  . CARDIAC CATHETERIZATION  10/29/2015   Procedure: Coronary Stent Intervention;  Surgeon: Burnell Blanks, MD;  Location: [place] [facility];  Service: Cardiovascular;;  . CAROTID STENT    . CESAREAN SECTION WITH BILATERAL TUBAL LIGATION    . CORONARY ARTERY BYPASS GRAFT  2007   2V  . IR GASTROSTOMY TUBE MOD SED  11/13/2018  . RIGHT/LEFT HEART CATH AND CORONARY/GRAFT ANGIOGRAPHY N/A 03/24/2018   Procedure: RIGHT/LEFT HEART CATH AND CORONARY/GRAFT ANGIOGRAPHY;  Surgeon: Belva Crome, MD;  Location: [place] [facility];  Service: Cardiovascular;  Laterality: N/A;  . TRACHEOSTOMY     Social History   Occupational History  . Not on file  Tobacco Use  . Smoking status: Current Every Day Smoker    Packs/day: 0.75    Types: Cigarettes    Start date: 10/23/1978  . Smokeless tobacco: Never Used  .  Tobacco comment: 1/2 pack - trying to cut back   Substance and Sexual Activity  . Alcohol use: No    Alcohol/week: 0.0 standard drinks  . Drug use: No  . Sexual activity: Not on file

## 2019-04-23 ENCOUNTER — Encounter (HOSPITAL_COMMUNITY): Payer: Self-pay | Admitting: Emergency Medicine

## 2019-04-23 ENCOUNTER — Other Ambulatory Visit: Payer: Self-pay

## 2019-04-23 ENCOUNTER — Emergency Department (HOSPITAL_COMMUNITY)
Admission: EM | Admit: 2019-04-23 | Discharge: 2019-04-23 | Disposition: A | Discharge status: Home / Self Care | Payer: Medicare Other | Attending: Emergency Medicine | Admitting: Emergency Medicine

## 2019-04-23 ENCOUNTER — Emergency Department (HOSPITAL_COMMUNITY): Payer: Medicare Other

## 2019-04-23 DIAGNOSIS — I272 Pulmonary hypertension, unspecified: Secondary | ICD-10-CM | POA: Diagnosis not present

## 2019-04-23 DIAGNOSIS — R1031 Right lower quadrant pain: Secondary | ICD-10-CM | POA: Diagnosis not present

## 2019-04-23 DIAGNOSIS — I5042 Chronic combined systolic (congestive) and diastolic (congestive) heart failure: Secondary | ICD-10-CM | POA: Insufficient documentation

## 2019-04-23 DIAGNOSIS — K8020 Calculus of gallbladder without cholecystitis without obstruction: Secondary | ICD-10-CM | POA: Diagnosis not present

## 2019-04-23 DIAGNOSIS — I110 Hypertensive heart disease with heart failure: Secondary | ICD-10-CM | POA: Insufficient documentation

## 2019-04-23 DIAGNOSIS — Z930 Tracheostomy status: Secondary | ICD-10-CM | POA: Diagnosis not present

## 2019-04-23 DIAGNOSIS — R1011 Right upper quadrant pain: Secondary | ICD-10-CM | POA: Diagnosis not present

## 2019-04-23 DIAGNOSIS — E876 Hypokalemia: Secondary | ICD-10-CM | POA: Diagnosis not present

## 2019-04-23 DIAGNOSIS — J449 Chronic obstructive pulmonary disease, unspecified: Secondary | ICD-10-CM | POA: Diagnosis not present

## 2019-04-23 DIAGNOSIS — Z79899 Other long term (current) drug therapy: Secondary | ICD-10-CM | POA: Diagnosis not present

## 2019-04-23 DIAGNOSIS — Z7901 Long term (current) use of anticoagulants: Secondary | ICD-10-CM | POA: Insufficient documentation

## 2019-04-23 DIAGNOSIS — F17210 Nicotine dependence, cigarettes, uncomplicated: Secondary | ICD-10-CM | POA: Insufficient documentation

## 2019-04-23 DIAGNOSIS — E119 Type 2 diabetes mellitus without complications: Secondary | ICD-10-CM | POA: Diagnosis not present

## 2019-04-23 DIAGNOSIS — J9611 Chronic respiratory failure with hypoxia: Secondary | ICD-10-CM | POA: Diagnosis not present

## 2019-04-23 DIAGNOSIS — R109 Unspecified abdominal pain: Secondary | ICD-10-CM

## 2019-04-23 DIAGNOSIS — I252 Old myocardial infarction: Secondary | ICD-10-CM | POA: Diagnosis not present

## 2019-04-23 DIAGNOSIS — I25810 Atherosclerosis of coronary artery bypass graft(s) without angina pectoris: Secondary | ICD-10-CM | POA: Diagnosis not present

## 2019-04-23 DIAGNOSIS — Z794 Long term (current) use of insulin: Secondary | ICD-10-CM | POA: Diagnosis not present

## 2019-04-23 LAB — COMPREHENSIVE METABOLIC PANEL
ALT: 16 U/L (ref 0–44)
AST: 15 U/L (ref 15–41)
Albumin: 3.1 g/dL — ABNORMAL LOW (ref 3.5–5.0)
Alkaline Phosphatase: 113 U/L (ref 38–126)
Anion gap: 10 (ref 5–15)
BUN: 9 mg/dL (ref 6–20)
CO2: 32 mmol/L (ref 22–32)
Calcium: 8.7 mg/dL — ABNORMAL LOW (ref 8.9–10.3)
Chloride: 98 mmol/L (ref 98–111)
Creatinine, Ser: 0.7 mg/dL (ref 0.44–1.00)
GFR calc Af Amer: >60 mL/min (ref >60)
GFR calc non Af Amer: >60 mL/min (ref >60)
Glucose, Bld: 113 mg/dL — ABNORMAL HIGH (ref 70–99)
Potassium: 3 mmol/L — ABNORMAL LOW (ref 3.5–5.1)
Sodium: 140 mmol/L (ref 135–145)
Total Bilirubin: 1.5 mg/dL — ABNORMAL HIGH (ref 0.3–1.2)
Total Protein: 6.8 g/dL (ref 6.5–8.1)

## 2019-04-23 LAB — CBC WITH DIFFERENTIAL/PLATELET
Abs Immature Granulocytes: 0.02 10*3/uL (ref 0.00–0.07)
Basophils Absolute: 0 10*3/uL (ref 0.0–0.1)
Basophils Relative: 0 %
Eosinophils Absolute: 0.1 10*3/uL (ref 0.0–0.5)
Eosinophils Relative: 2 %
HCT: 37.6 % (ref 36.0–46.0)
Hemoglobin: 12.4 g/dL (ref 12.0–15.0)
Immature Granulocytes: 0 %
Lymphocytes Relative: 20 %
Lymphs Abs: 1.5 10*3/uL (ref 0.7–4.0)
MCH: 31 pg (ref 26.0–34.0)
MCHC: 33 g/dL (ref 30.0–36.0)
MCV: 94 fL (ref 80.0–100.0)
Monocytes Absolute: 0.5 10*3/uL (ref 0.1–1.0)
Monocytes Relative: 6 %
Neutro Abs: 5.5 10*3/uL (ref 1.7–7.7)
Neutrophils Relative %: 72 %
Platelets: 247 10*3/uL (ref 150–400)
RBC: 4 MIL/uL (ref 3.87–5.11)
RDW: 15.5 % (ref 11.5–15.5)
WBC: 7.7 10*3/uL (ref 4.0–10.5)
nRBC: 0 % (ref 0.0–0.2)

## 2019-04-23 LAB — URINALYSIS, ROUTINE W REFLEX MICROSCOPIC
Bilirubin Urine: NEGATIVE
Glucose, UA: 50 mg/dL — AB
Hgb urine dipstick: NEGATIVE
Ketones, ur: NEGATIVE mg/dL
Leukocytes,Ua: NEGATIVE
Nitrite: NEGATIVE
Protein, ur: 100 mg/dL — AB
Specific Gravity, Urine: 1.025 (ref 1.005–1.030)
pH: 5 (ref 5.0–8.0)

## 2019-04-23 LAB — LIPASE, BLOOD: Lipase: 21 U/L (ref 11–51)

## 2019-04-23 MED ORDER — POTASSIUM CHLORIDE CRYS ER 20 MEQ PO TBCR
40.0000 meq | EXTENDED_RELEASE_TABLET | Freq: Once | ORAL | Status: AC
  Administered 2019-04-23: 20:00:00 40 meq via ORAL
  Filled 2019-04-23: qty 2

## 2019-04-23 MED ORDER — LIDOCAINE 5 % EX PTCH
1.0000 | MEDICATED_PATCH | CUTANEOUS | Status: DC
  Administered 2019-04-23: 1 via TRANSDERMAL
  Filled 2019-04-23: qty 1

## 2019-04-23 NOTE — ED Provider Notes (Signed)
[facility] EMERGENCY DEPARTMENT Provider Note   CSN: [id number] Arrival date & time: 04/23/19  1209    History   Chief Complaint Chief Complaint  Patient presents with   Flank Pain    right    HPI Theresa [name] is a 53 y.o. female.     53 year old female with complex medical history listed below presents with complaint of right mid back pain radiating to right upper quadrant abdomen for the past week.  Pain is intermittent, is worse with movement.  Associated with nausea, denies vomiting, changes in bowel or bladder habits.  Patient went to her PCP who sent her to [facility] for x-rays of her abdomen and was told that she did not have kidney stones. No other complaints or concerns.      Past Medical History:  Diagnosis Date   Acute kidney failure with lesion of tubular necrosis (HCC)    Acute systolic heart failure (HCC)    CAD (coronary artery disease)    a. s/p prior PCI. b. CABG 2007 at [facility] in [place] 2007. c. inferior STEMI 10/2015 s/p DES to dSVG-PDA.   Cardiac arrest [facility])    Cervical cancer (HCC)    Chronic diastolic CHF (congestive heart failure) (HCC)    Chronic respiratory failure (HCC)    s/p tracheostomy 2002   Chronic RUQ pain    COPD (chronic obstructive pulmonary disease) (HCC)    Diabetes mellitus ([place])    DVT (deep venous thrombosis) ([place])    Endometriosis    History of gallstones 01/2016   seen on Ultrasound   History of HIDA scan 11/2016   normal   HTN (hypertension)    Hyperlipidemia    Lupus anticoagulant disorder (HCC)    on coumadin   Morbid obesity (HCC)    ST elevation (STEMI) myocardial infarction involving right coronary artery ([place]) 10/29/15   stent to VG to PDA   Toe fracture, right 03/29/2018   Tracheostomy in place [facility]), chronic since 2002 11/03/2015   Tracheostomy status [facility])     Patient Active Problem List   Diagnosis Date Noted   Adult body mass index  50.0-59.9 ([place]) 03/22/2019   Anxiety 03/22/2019   Low back pain 03/22/2019   Cigarette nicotine dependence without complication 66/02/3015   History of vitamin D deficiency 03/22/2019   Insomnia 03/22/2019   Knee pain 03/22/2019   Localized, primary osteoarthritis of lower leg, unspecified laterality 03/22/2019   Neck pain 03/22/2019   Psoriasis 03/22/2019   Thrombophlebitis of deep veins of lower extremity ([place]) 03/22/2019   Deep vein thrombosis ([place]) 03/22/2019   Fracture of humeral shaft, left, closed 03/22/2019   Chronic respiratory insufficiency 01/19/2019   CAD (coronary artery disease) 01/15/2019   Educated About Covid-19 Virus Infection 01/12/2019   Acute on chronic combined systolic and diastolic CHF (congestive heart failure) ([place]) 01/12/2019   Acute on chronic respiratory failure with hypoxia ([place]) 10/24/2018   Pulmonary hypertension, unspecified ([place])    Acute on chronic respiratory failure with hypoxia ([place]) 08/01/2017   Uncontrolled type 2 diabetes mellitus with hyperglycemia, with long-term current use of insulin ([place]) 08/01/2017   Dyspnea 07/31/2017   Type 2 diabetes mellitus with hyperglycemia ([place]) 01/22/2016   CAD (coronary artery disease) of artery bypass graft: PTCA/DES to distal body VG to PDA 10/29/15 11/03/2015   Tracheostomy in place [facility]), chronic since 2002 11/03/2015   Hypokalemia 11/03/2015   Lupus anticoagulant syndrome (HCC)    Chronic diastolic CHF (congestive heart  failure) ([place])    Respiratory failure (HCC)    Coronary artery disease involving coronary bypass graft of native heart without angina pectoris    OSA (obstructive sleep apnea)    COPD exacerbation (HCC)    Tobacco abuse    DM (diabetes mellitus) ([place]) 06/11/2009   Hyperlipidemia LDL goal <70 06/11/2009   DVT 06/11/2009   History of CABG 06/11/2009   Essential hypertension 05/27/2009   CAD, NATIVE VESSEL 05/27/2009    Past Surgical History:    Procedure Laterality Date   CARDIAC CATHETERIZATION N/A 10/29/2015   Procedure: Left Heart Cath and Cors/Grafts Angiography;  Surgeon: Burnell Blanks, MD;  Location: [place] [facility];  Service: Cardiovascular;  Laterality: N/A;   CARDIAC CATHETERIZATION  10/29/2015   Procedure: Coronary Stent Intervention;  Surgeon: Burnell Blanks, MD;  Location: [place] [facility];  Service: Cardiovascular;;   CAROTID STENT     CESAREAN SECTION WITH BILATERAL TUBAL LIGATION     CORONARY ARTERY BYPASS GRAFT  2007   2V   IR GASTROSTOMY TUBE MOD SED  11/13/2018   RIGHT/LEFT HEART CATH AND CORONARY/GRAFT ANGIOGRAPHY N/A 03/24/2018   Procedure: RIGHT/LEFT HEART CATH AND CORONARY/GRAFT ANGIOGRAPHY;  Surgeon: Belva Crome, MD;  Location: [place] [facility];  Service: Cardiovascular;  Laterality: N/A;   TRACHEOSTOMY       OB History   No obstetric history on file.      Home Medications    Prior to Admission medications   Medication Sig Start Date End Date Taking? Authorizing Provider  albuterol (PROVENTIL) (2.5 MG/3ML) 0.083% nebulizer solution Take 3 mLs (2.5 mg total) by nebulization 2 (two) times daily. Patient taking differently: Take 2.5 mg by nebulization every 4 (four) hours as needed for wheezing.  10/19/18   Erick Colace, NP  atorvastatin (LIPITOR) 20 MG tablet Take 1 tablet (20 mg total) by mouth daily. 01/19/19 01/19/20  Sande Rives E, PA-C  famotidine (PEPCID) 20 MG tablet Take 20 mg by mouth daily.    [provider]  gabapentin (NEURONTIN) 300 MG capsule Take 1 capsule (300mg ) in the morning, 1 capsule (300mg ) in the afternoon, and 2 capsules (600mg ) at bedtime 01/19/19   Darreld Mclean, PA-C  HYDROcodone-acetaminophen (NORCO/VICODIN) 5-325 MG tablet Take 1 tablet by mouth every 4 (four) hours as needed for moderate pain. 03/22/19   Marybelle Killings, MD  Insulin Degludec (TRESIBA FLEXTOUCH [place]) Inject 30 Units into the skin every morning.    [provider]  LORazepam (ATIVAN) 0.5 MG tablet Take 0.5 mg by mouth every 6 (six) hours as needed for anxiety.     [provider]  losartan (COZAAR) 25 MG tablet Take 1 tablet (25 mg total) by mouth daily. 01/20/19   Sande Rives E, PA-C  metoprolol succinate (TOPROL-XL) 25 MG 24 hr tablet Take 25 mg by mouth daily.    [provider]  nitroGLYCERIN (NITROSTAT) 0.4 MG SL tablet Place 0.4 mg under the tongue every 5 (five) minutes as needed for chest pain.    [provider]  potassium chloride (K-DUR) 10 MEQ tablet Take 1 tablet (10 mEq total) by mouth 2 (two) times daily. 01/19/19   Sande Rives E, PA-C  sertraline (ZOLOFT) 50 MG tablet Take 50 mg by mouth daily.    [provider]  tamsulosin (FLOMAX) 0.4 MG CAPS capsule Take 0.4 mg by mouth daily.    [provider]  torsemide (DEMADEX) 20 MG tablet Take 4 tablets (80mg )  daily. Take an additional 4 tablets (80mg  daily) in the evening as needed if you have had > 3lb weight gain overnight. 01/19/19   Sande Rives E, PA-C  warfarin (COUMADIN) 1 MG tablet Take 1 tablet (1 mg total) by mouth daily at 6 PM. Take 1mg  tablet in addition to the 5mg  tablet for a total of 6mg  daily. 01/19/19   Darreld Mclean, PA-C  warfarin (COUMADIN) 5 MG tablet Take 1 tablet (5 mg total) by mouth one time only at 6 PM. Take 5mg  tablet in addition to the 1mg  tablet for a total of 6mg  daily. 01/19/19   Darreld Mclean, PA-C    Family History Family History  Problem Relation Age of Onset   Hypertension Mother    Diabetes Mother    Hypertension Father    Diabetes Father     Social History Social History   Tobacco Use   Smoking status: Current Every Day Smoker    Packs/day: 0.75    Types: Cigarettes    Start date: 10/23/1978   Smokeless tobacco: Never Used   Tobacco comment: 1/2 pack - trying to cut back   Substance Use Topics   Alcohol use: No    Alcohol/week: 0.0 standard drinks   Drug  use: No     Allergies   Penicillins, Ciprofloxacin, and Ibuprofen   Review of Systems Review of Systems  Constitutional: Negative for chills and fever.  Respiratory: Negative for shortness of breath.   Cardiovascular: Negative for chest pain.  Gastrointestinal: Positive for abdominal pain and nausea. Negative for constipation, diarrhea and vomiting.  Genitourinary: Negative for dysuria, frequency and hematuria.  Musculoskeletal: Positive for back pain.  Skin: Negative for rash and wound.  Neurological: Negative for weakness.  Psychiatric/Behavioral: Negative for confusion.  All other systems reviewed and are negative.    Physical Exam Updated Vital Signs BP (!) 147/90    Pulse 77    Temp 98.4 F (36.9 C) (Oral)    Resp 18    Ht 4\' 10"  (1.473 m)    Wt 104.3 kg    LMP  (Exact Date)    SpO2 97%    BMI 48.07 kg/m   Physical Exam Vitals signs and nursing note reviewed.  Constitutional:      Appearance: She is obese.  HENT:     Head: Normocephalic and atraumatic.  Cardiovascular:     Rate and Rhythm: Normal rate and regular rhythm.     Heart sounds: Normal heart sounds.  Pulmonary:     Effort: Pulmonary effort is normal.     Breath sounds: Normal breath sounds.  Abdominal:     Tenderness: There is generalized abdominal tenderness. There is no right CVA tenderness or left CVA tenderness.  Musculoskeletal:       Back:     Right lower leg: No edema.     Left lower leg: No edema.  Skin:    General: Skin is warm and dry.     Findings: No erythema or rash.  Neurological:     Mental Status: She is alert and oriented to person, place, and time.  Psychiatric:        Behavior: Behavior normal.      ED Treatments / Results  Labs (all labs ordered are listed, but only abnormal results are displayed) Labs Reviewed  COMPREHENSIVE METABOLIC PANEL - Abnormal; Notable for the following components:      Result Value   Potassium 3.0 (*)    Glucose, Bld 113 (*)  Calcium 8.7  (*)    Albumin 3.1 (*)    Total Bilirubin 1.5 (*)    All other components within normal limits  URINALYSIS, ROUTINE W REFLEX MICROSCOPIC - Abnormal; Notable for the following components:   Color, Urine AMBER (*)    APPearance HAZY (*)    Glucose, UA 50 (*)    Protein, ur 100 (*)    Bacteria, UA RARE (*)    All other components within normal limits  CBC WITH DIFFERENTIAL/PLATELET  LIPASE, BLOOD    EKG None  Radiology Ct Renal Stone Study  Result Date: 04/23/2019 CLINICAL DATA:  53 year old female with acute RIGHT flank, abdominal and back pain for 1 week. EXAM: CT ABDOMEN AND PELVIS WITHOUT CONTRAST TECHNIQUE: Multidetector CT imaging of the abdomen and pelvis was performed following the standard protocol without IV contrast. COMPARISON:  11/21/2018 and prior CTs FINDINGS: Please note that parenchymal and vascular abnormalities may be missed without intravenous contrast. Lower chest: Bibasilar scarring/atelectasis and ground-glass opacities are unchanged. Cardiomegaly again noted. Hepatobiliary: The liver is unremarkable. Cholelithiasis noted without CT evidence of acute cholecystitis. No biliary dilatation. Pancreas: Unremarkable Spleen: Unremarkable Adrenals/Urinary Tract: The kidneys, adrenal glands and bladder are unremarkable except for stable LEFT adrenal adenoma. There is no evidence of hydronephrosis or urinary calculi. Stomach/Bowel: Stomach is within normal limits. Appendix appears normal. No evidence of bowel wall thickening, distention, or inflammatory changes. Vascular/Lymphatic: Aortic atherosclerosis. No enlarged abdominal or pelvic lymph nodes. Reproductive: Uterus and bilateral adnexa are unremarkable. Other: No ascites,, focal collection or pneumoperitoneum. Musculoskeletal: No acute or suspicious bony abnormalities. IMPRESSION: 1. No acute abnormality. No CT findings to suggest a cause for this patient's RIGHT abdominal pain. 2. Cholelithiasis without CT evidence of acute  cholecystitis. 3. Cardiomegaly and chronic bibasilar pulmonary scarring/atelectasis/ground-glass opacities. 4.  Aortic Atherosclerosis (ICD10-I70.0). Electronically Signed   By: Margarette Canada M.D.   On: 04/23/2019 18:19    Procedures Procedures (including critical care time)  Medications Ordered in ED Medications  potassium chloride SA (K-DUR) CR tablet 40 mEq (has no administration in time range)  lidocaine (LIDODERM) 5 % 1 patch (has no administration in time range)     Initial Impression / Assessment and Plan / ED Course  I have reviewed the triage vital signs and the nursing notes.  Pertinent labs & imaging results that were available during my care of the patient were reviewed by me and considered in my medical decision making (see chart for details).  Clinical Course as of Apr 22 1925  Mon Apr 22, 3196  239 53 year old female with complaint of right-sided back pain.  Seen by PCP and told x-rays did not show kidney stones.  On exam patient has tenderness palpation her right lower back as well as abdominal tenderness.  Review of lab work, lipase within normal limits, CMP with mild hypokalemia with potassium 3.0, repleted orally.  LFTs within normal limits.  Urinalysis positive for glucose, protein, no evidence of urinary tract infection.  CBC is unremarkable.  CT abdomen pelvis without contrast shows ongoing right cholelithiasis without acute cholecystitis.  No acute findings to explain patient's pain tonight.  Pain was unrelieved with pain medication the emergency room.  Plan is to place Lidoderm patch and discharge for patient follow-up with PCP.   [LM]    Clinical Course User Index [LM] Tacy Learn, PA-C      Final Clinical Impressions(s) / ED Diagnoses   Final diagnoses:  Flank pain  Hypokalemia    ED Discharge Orders  None       Roque Lias 04/23/19 1926    Hayden Rasmussen, MD 04/24/19 [phone]

## 2019-04-23 NOTE — Discharge Instructions (Addendum)
Follow-up with your doctor.  Apply pain patch to back for relief, these are available over-the-counter.

## 2019-04-23 NOTE — ED Notes (Signed)
Urine culture was sent with urinalysis.

## 2019-04-23 NOTE — ED Triage Notes (Signed)
Pt reports right flank and back pain x1 week. Pt reports going to her PCP and getting scans for kidney stones but they were negative. Pt presents to the ED today because the pain has not gone away and she believes there is something else going on.

## 2019-04-24 ENCOUNTER — Encounter: Payer: Medicare Other | Admitting: Vascular Surgery

## 2019-04-24 ENCOUNTER — Inpatient Hospital Stay (HOSPITAL_COMMUNITY): Admission: RE | Admit: 2019-04-24 | Payer: Medicare Other | Source: Ambulatory Visit

## 2019-04-25 DIAGNOSIS — R1011 Right upper quadrant pain: Secondary | ICD-10-CM | POA: Diagnosis not present

## 2019-04-25 DIAGNOSIS — Z299 Encounter for prophylactic measures, unspecified: Secondary | ICD-10-CM | POA: Diagnosis not present

## 2019-04-25 DIAGNOSIS — J449 Chronic obstructive pulmonary disease, unspecified: Secondary | ICD-10-CM | POA: Diagnosis not present

## 2019-04-25 DIAGNOSIS — E1165 Type 2 diabetes mellitus with hyperglycemia: Secondary | ICD-10-CM | POA: Diagnosis not present

## 2019-04-25 DIAGNOSIS — Z6841 Body Mass Index (BMI) 40.0 and over, adult: Secondary | ICD-10-CM | POA: Diagnosis not present

## 2019-04-25 DIAGNOSIS — K8080 Other cholelithiasis without obstruction: Secondary | ICD-10-CM | POA: Diagnosis not present

## 2019-04-25 DIAGNOSIS — R112 Nausea with vomiting, unspecified: Secondary | ICD-10-CM | POA: Diagnosis not present

## 2019-04-25 DIAGNOSIS — K8020 Calculus of gallbladder without cholecystitis without obstruction: Secondary | ICD-10-CM | POA: Diagnosis not present

## 2019-04-25 DIAGNOSIS — I10 Essential (primary) hypertension: Secondary | ICD-10-CM | POA: Diagnosis not present

## 2019-05-07 ENCOUNTER — Inpatient Hospital Stay (HOSPITAL_COMMUNITY)
Admission: EM | Admit: 2019-05-07 | Discharge: 2019-05-17 | DRG: 444 | Disposition: A | Discharge status: Home Health | Payer: Medicare Other | Attending: Student | Admitting: Student

## 2019-05-07 ENCOUNTER — Emergency Department (HOSPITAL_COMMUNITY): Payer: Medicare Other

## 2019-05-07 ENCOUNTER — Other Ambulatory Visit: Payer: Self-pay

## 2019-05-07 ENCOUNTER — Encounter (HOSPITAL_COMMUNITY): Payer: Self-pay | Admitting: Emergency Medicine

## 2019-05-07 DIAGNOSIS — R1011 Right upper quadrant pain: Secondary | ICD-10-CM

## 2019-05-07 DIAGNOSIS — Z7901 Long term (current) use of anticoagulants: Secondary | ICD-10-CM

## 2019-05-07 DIAGNOSIS — J449 Chronic obstructive pulmonary disease, unspecified: Secondary | ICD-10-CM | POA: Diagnosis present

## 2019-05-07 DIAGNOSIS — Z6841 Body Mass Index (BMI) 40.0 and over, adult: Secondary | ICD-10-CM | POA: Diagnosis not present

## 2019-05-07 DIAGNOSIS — I110 Hypertensive heart disease with heart failure: Secondary | ICD-10-CM | POA: Diagnosis present

## 2019-05-07 DIAGNOSIS — Z955 Presence of coronary angioplasty implant and graft: Secondary | ICD-10-CM

## 2019-05-07 DIAGNOSIS — K8010 Calculus of gallbladder with chronic cholecystitis without obstruction: Secondary | ICD-10-CM | POA: Diagnosis not present

## 2019-05-07 DIAGNOSIS — I5043 Acute on chronic combined systolic (congestive) and diastolic (congestive) heart failure: Secondary | ICD-10-CM | POA: Diagnosis not present

## 2019-05-07 DIAGNOSIS — J9621 Acute and chronic respiratory failure with hypoxia: Secondary | ICD-10-CM | POA: Diagnosis present

## 2019-05-07 DIAGNOSIS — J9611 Chronic respiratory failure with hypoxia: Secondary | ICD-10-CM | POA: Diagnosis not present

## 2019-05-07 DIAGNOSIS — I959 Hypotension, unspecified: Secondary | ICD-10-CM | POA: Diagnosis not present

## 2019-05-07 DIAGNOSIS — Z880 Allergy status to penicillin: Secondary | ICD-10-CM | POA: Diagnosis not present

## 2019-05-07 DIAGNOSIS — Z8541 Personal history of malignant neoplasm of cervix uteri: Secondary | ICD-10-CM | POA: Diagnosis not present

## 2019-05-07 DIAGNOSIS — Z86711 Personal history of pulmonary embolism: Secondary | ICD-10-CM | POA: Diagnosis not present

## 2019-05-07 DIAGNOSIS — E785 Hyperlipidemia, unspecified: Secondary | ICD-10-CM | POA: Diagnosis present

## 2019-05-07 DIAGNOSIS — I10 Essential (primary) hypertension: Secondary | ICD-10-CM | POA: Diagnosis not present

## 2019-05-07 DIAGNOSIS — F329 Major depressive disorder, single episode, unspecified: Secondary | ICD-10-CM | POA: Diagnosis present

## 2019-05-07 DIAGNOSIS — I252 Old myocardial infarction: Secondary | ICD-10-CM | POA: Diagnosis not present

## 2019-05-07 DIAGNOSIS — D6862 Lupus anticoagulant syndrome: Secondary | ICD-10-CM | POA: Diagnosis not present

## 2019-05-07 DIAGNOSIS — J9690 Respiratory failure, unspecified, unspecified whether with hypoxia or hypercapnia: Secondary | ICD-10-CM | POA: Diagnosis present

## 2019-05-07 DIAGNOSIS — E1165 Type 2 diabetes mellitus with hyperglycemia: Secondary | ICD-10-CM | POA: Diagnosis present

## 2019-05-07 DIAGNOSIS — E876 Hypokalemia: Secondary | ICD-10-CM | POA: Diagnosis present

## 2019-05-07 DIAGNOSIS — K8020 Calculus of gallbladder without cholecystitis without obstruction: Principal | ICD-10-CM | POA: Diagnosis present

## 2019-05-07 DIAGNOSIS — Z794 Long term (current) use of insulin: Secondary | ICD-10-CM

## 2019-05-07 DIAGNOSIS — K8000 Calculus of gallbladder with acute cholecystitis without obstruction: Secondary | ICD-10-CM | POA: Diagnosis not present

## 2019-05-07 DIAGNOSIS — J9612 Chronic respiratory failure with hypercapnia: Secondary | ICD-10-CM | POA: Diagnosis not present

## 2019-05-07 DIAGNOSIS — R791 Abnormal coagulation profile: Secondary | ICD-10-CM | POA: Diagnosis not present

## 2019-05-07 DIAGNOSIS — Z79899 Other long term (current) drug therapy: Secondary | ICD-10-CM

## 2019-05-07 DIAGNOSIS — F419 Anxiety disorder, unspecified: Secondary | ICD-10-CM | POA: Diagnosis present

## 2019-05-07 DIAGNOSIS — I25810 Atherosclerosis of coronary artery bypass graft(s) without angina pectoris: Secondary | ICD-10-CM | POA: Diagnosis not present

## 2019-05-07 DIAGNOSIS — F17210 Nicotine dependence, cigarettes, uncomplicated: Secondary | ICD-10-CM | POA: Diagnosis not present

## 2019-05-07 DIAGNOSIS — I272 Pulmonary hypertension, unspecified: Secondary | ICD-10-CM | POA: Diagnosis present

## 2019-05-07 DIAGNOSIS — I2510 Atherosclerotic heart disease of native coronary artery without angina pectoris: Secondary | ICD-10-CM | POA: Diagnosis present

## 2019-05-07 DIAGNOSIS — I509 Heart failure, unspecified: Secondary | ICD-10-CM | POA: Diagnosis not present

## 2019-05-07 DIAGNOSIS — R1010 Upper abdominal pain, unspecified: Secondary | ICD-10-CM | POA: Diagnosis not present

## 2019-05-07 DIAGNOSIS — Z951 Presence of aortocoronary bypass graft: Secondary | ICD-10-CM

## 2019-05-07 DIAGNOSIS — J90 Pleural effusion, not elsewhere classified: Secondary | ICD-10-CM | POA: Diagnosis not present

## 2019-05-07 DIAGNOSIS — Z23 Encounter for immunization: Secondary | ICD-10-CM | POA: Diagnosis not present

## 2019-05-07 DIAGNOSIS — Z86718 Personal history of other venous thrombosis and embolism: Secondary | ICD-10-CM | POA: Diagnosis not present

## 2019-05-07 DIAGNOSIS — I5030 Unspecified diastolic (congestive) heart failure: Secondary | ICD-10-CM | POA: Diagnosis not present

## 2019-05-07 DIAGNOSIS — Z01810 Encounter for preprocedural cardiovascular examination: Secondary | ICD-10-CM | POA: Diagnosis not present

## 2019-05-07 DIAGNOSIS — Z20828 Contact with and (suspected) exposure to other viral communicable diseases: Secondary | ICD-10-CM | POA: Diagnosis present

## 2019-05-07 DIAGNOSIS — E119 Type 2 diabetes mellitus without complications: Secondary | ICD-10-CM | POA: Diagnosis present

## 2019-05-07 DIAGNOSIS — Z03818 Encounter for observation for suspected exposure to other biological agents ruled out: Secondary | ICD-10-CM | POA: Diagnosis not present

## 2019-05-07 DIAGNOSIS — E1140 Type 2 diabetes mellitus with diabetic neuropathy, unspecified: Secondary | ICD-10-CM

## 2019-05-07 LAB — COMPREHENSIVE METABOLIC PANEL
ALT: 14 U/L (ref 0–44)
AST: 17 U/L (ref 15–41)
Albumin: 3.1 g/dL — ABNORMAL LOW (ref 3.5–5.0)
Alkaline Phosphatase: 117 U/L (ref 38–126)
Anion gap: 17 — ABNORMAL HIGH (ref 5–15)
BUN: 10 mg/dL (ref 6–20)
CO2: 28 mmol/L (ref 22–32)
Calcium: 8.8 mg/dL — ABNORMAL LOW (ref 8.9–10.3)
Chloride: 92 mmol/L — ABNORMAL LOW (ref 98–111)
Creatinine, Ser: 0.75 mg/dL (ref 0.44–1.00)
GFR calc Af Amer: >60 mL/min (ref >60)
GFR calc non Af Amer: >60 mL/min (ref >60)
Glucose, Bld: 302 mg/dL — ABNORMAL HIGH (ref 70–99)
Potassium: 3.2 mmol/L — ABNORMAL LOW (ref 3.5–5.1)
Sodium: 137 mmol/L (ref 135–145)
Total Bilirubin: 2.3 mg/dL — ABNORMAL HIGH (ref 0.3–1.2)
Total Protein: 6.9 g/dL (ref 6.5–8.1)

## 2019-05-07 LAB — LIPASE, BLOOD: Lipase: 17 U/L (ref 11–51)

## 2019-05-07 LAB — SARS CORONAVIRUS 2 BY RT PCR (HOSPITAL ORDER, PERFORMED IN ~~LOC~~ HOSPITAL LAB): SARS Coronavirus 2: NEGATIVE

## 2019-05-07 LAB — I-STAT BETA HCG BLOOD, ED (MC, WL, AP ONLY): I-stat hCG, quantitative: 8.2 m[IU]/mL — ABNORMAL HIGH (ref <5)

## 2019-05-07 LAB — CBC
HCT: 41.6 % (ref 36.0–46.0)
Hemoglobin: 13.6 g/dL (ref 12.0–15.0)
MCH: 32 pg (ref 26.0–34.0)
MCHC: 32.7 g/dL (ref 30.0–36.0)
MCV: 97.9 fL (ref 80.0–100.0)
Platelets: 333 10*3/uL (ref 150–400)
RBC: 4.25 MIL/uL (ref 3.87–5.11)
RDW: 18.1 % — ABNORMAL HIGH (ref 11.5–15.5)
WBC: 12.2 10*3/uL — ABNORMAL HIGH (ref 4.0–10.5)
nRBC: 0 % (ref 0.0–0.2)

## 2019-05-07 LAB — PROTIME-INR
INR: 1.3 — ABNORMAL HIGH (ref 0.8–1.2)
Prothrombin Time: 16.2 seconds — ABNORMAL HIGH (ref 11.4–15.2)

## 2019-05-07 LAB — GLUCOSE, CAPILLARY: Glucose-Capillary: 144 mg/dL — ABNORMAL HIGH (ref 70–99)

## 2019-05-07 LAB — BRAIN NATRIURETIC PEPTIDE: B Natriuretic Peptide: 2264.5 pg/mL — ABNORMAL HIGH (ref 0.0–100.0)

## 2019-05-07 MED ORDER — TAMSULOSIN HCL 0.4 MG PO CAPS
0.4000 mg | ORAL_CAPSULE | Freq: Every day | ORAL | Status: DC
  Administered 2019-05-08 – 2019-05-17 (×10): 0.4 mg via ORAL
  Filled 2019-05-07 (×10): qty 1

## 2019-05-07 MED ORDER — HEPARIN (PORCINE) 25000 UT/250ML-% IV SOLN
1000.0000 [IU]/h | INTRAVENOUS | Status: DC
  Administered 2019-05-07 – 2019-05-13 (×8): 1300 [IU]/h via INTRAVENOUS
  Administered 2019-05-14: 05:00:00 1200 [IU]/h via INTRAVENOUS
  Administered 2019-05-15 – 2019-05-16 (×2): 1100 [IU]/h via INTRAVENOUS
  Administered 2019-05-17: 06:00:00 1000 [IU]/h via INTRAVENOUS
  Filled 2019-05-07 (×13): qty 250

## 2019-05-07 MED ORDER — ONDANSETRON HCL 4 MG/2ML IJ SOLN
4.0000 mg | Freq: Four times a day (QID) | INTRAMUSCULAR | Status: DC | PRN
  Administered 2019-05-07 (×2): 4 mg via INTRAVENOUS
  Filled 2019-05-07 (×2): qty 2

## 2019-05-07 MED ORDER — FAMOTIDINE 20 MG PO TABS
20.0000 mg | ORAL_TABLET | Freq: Every day | ORAL | Status: DC
  Administered 2019-05-08 – 2019-05-17 (×10): 20 mg via ORAL
  Filled 2019-05-07 (×10): qty 1

## 2019-05-07 MED ORDER — ACETAMINOPHEN 325 MG PO TABS
650.0000 mg | ORAL_TABLET | Freq: Four times a day (QID) | ORAL | Status: DC | PRN
  Administered 2019-05-13 – 2019-05-16 (×3): 650 mg via ORAL
  Filled 2019-05-07 (×3): qty 2

## 2019-05-07 MED ORDER — SERTRALINE HCL 50 MG PO TABS
50.0000 mg | ORAL_TABLET | Freq: Every day | ORAL | Status: DC
  Administered 2019-05-08 – 2019-05-17 (×10): 50 mg via ORAL
  Filled 2019-05-07 (×10): qty 1

## 2019-05-07 MED ORDER — FUROSEMIDE 10 MG/ML IJ SOLN
80.0000 mg | Freq: Two times a day (BID) | INTRAMUSCULAR | Status: DC
  Administered 2019-05-07 – 2019-05-13 (×12): 80 mg via INTRAVENOUS
  Filled 2019-05-07 (×12): qty 8

## 2019-05-07 MED ORDER — HYDROCODONE-ACETAMINOPHEN 5-325 MG PO TABS
1.0000 | ORAL_TABLET | ORAL | Status: DC | PRN
  Administered 2019-05-07 – 2019-05-16 (×16): 1 via ORAL
  Filled 2019-05-07 (×17): qty 1

## 2019-05-07 MED ORDER — GABAPENTIN 300 MG PO CAPS
300.0000 mg | ORAL_CAPSULE | Freq: Two times a day (BID) | ORAL | Status: DC
  Administered 2019-05-08 – 2019-05-17 (×20): 300 mg via ORAL
  Filled 2019-05-07 (×20): qty 1

## 2019-05-07 MED ORDER — FENTANYL CITRATE (PF) 100 MCG/2ML IJ SOLN
100.0000 ug | Freq: Once | INTRAMUSCULAR | Status: AC
  Administered 2019-05-07: 16:00:00 100 ug via INTRAVENOUS
  Filled 2019-05-07: qty 2

## 2019-05-07 MED ORDER — HEPARIN BOLUS VIA INFUSION
3500.0000 [IU] | Freq: Once | INTRAVENOUS | Status: AC
  Administered 2019-05-07: 22:00:00 3500 [IU] via INTRAVENOUS
  Filled 2019-05-07: qty 3500

## 2019-05-07 MED ORDER — FENTANYL CITRATE (PF) 100 MCG/2ML IJ SOLN
25.0000 ug | INTRAMUSCULAR | Status: DC | PRN
  Administered 2019-05-08: 01:00:00 25 ug via INTRAVENOUS
  Filled 2019-05-07: qty 2

## 2019-05-07 MED ORDER — INSULIN GLARGINE 100 UNIT/ML ~~LOC~~ SOLN
30.0000 [IU] | Freq: Every morning | SUBCUTANEOUS | Status: DC
  Administered 2019-05-08: 09:00:00 30 [IU] via SUBCUTANEOUS
  Filled 2019-05-07: qty 0.3

## 2019-05-07 MED ORDER — ALBUTEROL SULFATE (2.5 MG/3ML) 0.083% IN NEBU: 2.5000 mg | INHALATION_SOLUTION | Freq: Four times a day (QID) | RESPIRATORY_TRACT | Status: DC | PRN

## 2019-05-07 MED ORDER — METOPROLOL SUCCINATE ER 25 MG PO TB24
25.0000 mg | ORAL_TABLET | Freq: Every day | ORAL | Status: DC
  Administered 2019-05-08 – 2019-05-17 (×10): 25 mg via ORAL
  Filled 2019-05-07 (×10): qty 1

## 2019-05-07 MED ORDER — SODIUM CHLORIDE 0.9 % IV SOLN
2.0000 g | Freq: Three times a day (TID) | INTRAVENOUS | Status: DC
  Administered 2019-05-07 – 2019-05-16 (×26): 2 g via INTRAVENOUS
  Filled 2019-05-07 (×29): qty 2

## 2019-05-07 MED ORDER — ONDANSETRON HCL 4 MG PO TABS: 4.0000 mg | ORAL_TABLET | Freq: Four times a day (QID) | ORAL | Status: DC | PRN

## 2019-05-07 MED ORDER — POTASSIUM CHLORIDE CRYS ER 10 MEQ PO TBCR
10.0000 meq | EXTENDED_RELEASE_TABLET | Freq: Two times a day (BID) | ORAL | Status: DC
  Administered 2019-05-07: 23:00:00 10 meq via ORAL
  Filled 2019-05-07: qty 1

## 2019-05-07 MED ORDER — ATORVASTATIN CALCIUM 10 MG PO TABS
20.0000 mg | ORAL_TABLET | Freq: Every day | ORAL | Status: DC
  Administered 2019-05-08 – 2019-05-17 (×10): 20 mg via ORAL
  Filled 2019-05-07 (×10): qty 2

## 2019-05-07 MED ORDER — ACETAMINOPHEN 650 MG RE SUPP: 650.0000 mg | Freq: Four times a day (QID) | RECTAL | Status: DC | PRN

## 2019-05-07 MED ORDER — GABAPENTIN 300 MG PO CAPS
600.0000 mg | ORAL_CAPSULE | Freq: Every day | ORAL | Status: DC
  Administered 2019-05-07 – 2019-05-16 (×10): 600 mg via ORAL
  Filled 2019-05-07 (×10): qty 2

## 2019-05-07 MED ORDER — METRONIDAZOLE IN NACL 5-0.79 MG/ML-% IV SOLN
500.0000 mg | Freq: Three times a day (TID) | INTRAVENOUS | Status: DC
  Administered 2019-05-07 – 2019-05-16 (×26): 500 mg via INTRAVENOUS
  Filled 2019-05-07 (×28): qty 100

## 2019-05-07 MED ORDER — LORAZEPAM 0.5 MG PO TABS
0.5000 mg | ORAL_TABLET | Freq: Four times a day (QID) | ORAL | Status: DC | PRN
  Administered 2019-05-11 – 2019-05-12 (×3): 0.5 mg via ORAL
  Filled 2019-05-07 (×4): qty 1

## 2019-05-07 MED ORDER — SODIUM CHLORIDE 0.9% FLUSH
3.0000 mL | Freq: Two times a day (BID) | INTRAVENOUS | Status: DC
  Administered 2019-05-08 – 2019-05-14 (×9): 3 mL via INTRAVENOUS
  Administered 2019-05-15: 12:00:00 10 mL via INTRAVENOUS

## 2019-05-07 MED ORDER — LOSARTAN POTASSIUM 25 MG PO TABS
25.0000 mg | ORAL_TABLET | Freq: Every day | ORAL | Status: DC
  Administered 2019-05-08 – 2019-05-17 (×9): 25 mg via ORAL
  Filled 2019-05-07 (×10): qty 1

## 2019-05-07 MED ORDER — INSULIN ASPART 100 UNIT/ML ~~LOC~~ SOLN
0.0000 [IU] | Freq: Three times a day (TID) | SUBCUTANEOUS | Status: DC
  Administered 2019-05-08: 8 [IU] via SUBCUTANEOUS
  Administered 2019-05-08: 09:00:00 5 [IU] via SUBCUTANEOUS
  Administered 2019-05-08 – 2019-05-09 (×2): 3 [IU] via SUBCUTANEOUS
  Administered 2019-05-09 – 2019-05-10 (×3): 2 [IU] via SUBCUTANEOUS
  Administered 2019-05-10 (×2): 3 [IU] via SUBCUTANEOUS
  Administered 2019-05-11 (×2): 2 [IU] via SUBCUTANEOUS
  Administered 2019-05-11: 13:00:00 11 [IU] via SUBCUTANEOUS
  Administered 2019-05-12: 10:00:00 2 [IU] via SUBCUTANEOUS
  Administered 2019-05-12: 5 [IU] via SUBCUTANEOUS

## 2019-05-07 NOTE — H&P (Signed)
History and Physical    Theresa [name] MVE:[id number] DOB: [birth year] DOA: 05/07/2019  Referring MD/NP/PA: Harrell Gave lawyer, PA-C PCP: Monico Blitz, MD  Patient coming from: Home  Chief Complaint: Abdominal pain  I have personally briefly reviewed patient's old medical records in [place]   HPI: Theresa [name] is a 53 y.o. female with medical history significant of CAD s/p Cabg, chronic respiratory failure due to COPD trach dependent since 2002, PEA arrest due to a dislodged trach, DVT and lupus anticoagulant on chronic Coumadin, hypertension, and insulin-dependent diabetes mellitus type 2, and morbid obesity; who presents with complaints of abdominal pain.  Pain is located in the epigastrically and in the right quadrant with radiation to her back.  Associated symptoms included nausea, vomiting, and diarrhea over the last 2 days.  Endorses increasing shortness of breath, abdominal distention, productive cough, chills, and sweats.  She was evaluated in the emergency department 2 weeks ago and noted to have cholelithiasis without cholecystitis on CT scan.   ED Course: Upon admission into the emergency department patient was noted to be afebrile, but was tachycardic and tachypneic with O2 saturations as low as 73% improved on 8 L of nasal cannula oxygen.  Labs revealed WBC 12.2, potassium 3.2, glucose 302.  Urinalysis did not show any signs of infection.  Chest x-ray was concerning for cardiomegaly with interstitial edema and trace bilateral effusions.  Right upper quadrant ultrasound showed cholelithiasis without signs of cholecystitis.  General surgery was consulted.  Review of system: A complete 10 point review of systems was performed and negative except for as noted above in HPI.  Past Medical History:  Diagnosis Date  . Acute kidney failure with lesion of tubular necrosis (HCC)   . Acute systolic heart failure ([place])   . CAD (coronary artery disease)    a. s/p prior PCI. b. CABG  2007 at [facility] in [place] 2007. c. inferior STEMI 10/2015 s/p DES to dSVG-PDA.  . Cardiac arrest ([place])   . Cervical cancer ([place])   . Chronic diastolic CHF (congestive heart failure) ([place])   . Chronic respiratory failure ([place])    s/p tracheostomy 2002  . Chronic RUQ pain   . COPD (chronic obstructive pulmonary disease) ([place])   . Diabetes mellitus ([place])   . DVT (deep venous thrombosis) ([place])   . Endometriosis   . History of gallstones 01/2016   seen on Ultrasound  . History of HIDA scan 11/2016   normal  . HTN (hypertension)   . Hyperlipidemia   . Lupus anticoagulant disorder (HCC)    on coumadin  . Morbid obesity ([place])   . ST elevation (STEMI) myocardial infarction involving right coronary artery ([place]) 10/29/15   stent to VG to PDA  . Toe fracture, right 03/29/2018  . Tracheostomy in place [facility]), chronic since 2002 11/03/2015  . Tracheostomy status [facility])     Past Surgical History:  Procedure Laterality Date  . CARDIAC CATHETERIZATION N/A 10/29/2015   Procedure: Left Heart Cath and Cors/Grafts Angiography;  Surgeon: Burnell Blanks, MD;  Location: [place] [facility];  Service: Cardiovascular;  Laterality: N/A;  . CARDIAC CATHETERIZATION  10/29/2015   Procedure: Coronary Stent Intervention;  Surgeon: Burnell Blanks, MD;  Location: [place] [facility];  Service: Cardiovascular;;  . CAROTID STENT    . CESAREAN SECTION WITH BILATERAL TUBAL LIGATION    . CORONARY ARTERY BYPASS GRAFT  2007   2V  . IR GASTROSTOMY TUBE MOD SED  11/13/2018  .  RIGHT/LEFT HEART CATH AND CORONARY/GRAFT ANGIOGRAPHY N/A 03/24/2018   Procedure: RIGHT/LEFT HEART CATH AND CORONARY/GRAFT ANGIOGRAPHY;  Surgeon: Belva Crome, MD;  Location: [place] [facility];  Service: Cardiovascular;  Laterality: N/A;  . TRACHEOSTOMY       reports that she has been smoking cigarettes. She started smoking about 40 years ago. She has been smoking about 0.75 packs per day. She has never used smokeless tobacco.  She reports that she does not drink alcohol or use drugs.  Allergies  Allergen Reactions  . Penicillins Rash    Has patient had a PCN reaction causing immediate rash, facial/tongue/throat swelling, SOB or lightheadedness with hypotension: Yes Has patient had a PCN reaction causing severe rash involving mucus membranes or skin necrosis: No Has patient had a PCN reaction that required hospitalization No Has patient had a PCN reaction occurring within the last 10 years: No If all of the above answers are "NO", then may proceed with Cephalosporin use.   REACTION: rash  . Ciprofloxacin     nausea  . Ibuprofen Rash    swelling in leg     Family History  Problem Relation Age of Onset  . Hypertension Mother   . Diabetes Mother   . Hypertension Father   . Diabetes Father     Prior to Admission medications   Medication Sig Start Date End Date Taking? Authorizing Provider  albuterol (PROVENTIL) (2.5 MG/3ML) 0.083% nebulizer solution Take 3 mLs (2.5 mg total) by nebulization 2 (two) times daily. Patient taking differently: Take 2.5 mg by nebulization every 4 (four) hours as needed for wheezing.  10/19/18   Erick Colace, NP  atorvastatin (LIPITOR) 20 MG tablet Take 1 tablet (20 mg total) by mouth daily. 01/19/19 01/19/20  Sande Rives E, PA-C  famotidine (PEPCID) 20 MG tablet Take 20 mg by mouth daily.    [provider]  gabapentin (NEURONTIN) 300 MG capsule Take 1 capsule (300mg ) in the morning, 1 capsule (300mg ) in the afternoon, and 2 capsules (600mg ) at bedtime 01/19/19   Darreld Mclean, PA-C  HYDROcodone-acetaminophen (NORCO/VICODIN) 5-325 MG tablet Take 1 tablet by mouth every 4 (four) hours as needed for moderate pain. 03/22/19   Marybelle Killings, MD  Insulin Degludec (TRESIBA FLEXTOUCH [place]) Inject 30 Units into the skin every morning.    [provider]  LORazepam (ATIVAN) 0.5 MG tablet Take 0.5 mg by mouth every 6 (six) hours as needed for anxiety.     [provider]  losartan (COZAAR) 25 MG tablet Take 1 tablet (25 mg total) by mouth daily. 01/20/19   Sande Rives E, PA-C  metoprolol succinate (TOPROL-XL) 25 MG 24 hr tablet Take 25 mg by mouth daily.    [provider]  nitroGLYCERIN (NITROSTAT) 0.4 MG SL tablet Place 0.4 mg under the tongue every 5 (five) minutes as needed for chest pain.    [provider]  potassium chloride (K-DUR) 10 MEQ tablet Take 1 tablet (10 mEq total) by mouth 2 (two) times daily. 01/19/19   Sande Rives E, PA-C  sertraline (ZOLOFT) 50 MG tablet Take 50 mg by mouth daily.    [provider]  tamsulosin (FLOMAX) 0.4 MG CAPS capsule Take 0.4 mg by mouth daily.    [provider]  torsemide (DEMADEX) 20 MG tablet Take 4 tablets (80mg ) daily. Take an additional 4 tablets (80mg  daily) in the evening as needed if you have had > 3lb weight gain overnight. 01/19/19   Sande Rives  E, PA-C  warfarin (COUMADIN) 1 MG tablet Take 1 tablet (1 mg total) by mouth daily at 6 PM. Take 1mg  tablet in addition to the 5mg  tablet for a total of 6mg  daily. 01/19/19   Darreld Mclean, PA-C  warfarin (COUMADIN) 5 MG tablet Take 1 tablet (5 mg total) by mouth one time only at 6 PM. Take 5mg  tablet in addition to the 1mg  tablet for a total of 6mg  daily. 01/19/19   Darreld Mclean, PA-C    Physical Exam:  Constitutional: Obese female who appears to be in some discomfort Vitals:   05/07/19 1426 05/07/19 1430 05/07/19 1500 05/07/19 1615  BP: (!) 154/99 (!) 152/95 (!) 145/95 (!) 140/95  Pulse: (!) 110 (!) 109 (!) 103 (!) 103  Resp: 20 20 (!) 22 (!) 23  Temp:      TempSrc:      SpO2: 90% 91% 90% 91%  Weight:      Height:       Eyes: PERRL, lids and conjunctivae normal ENMT: Mucous membranes are dry. Posterior pharynx clear of any exudate or lesions.  Neck: normal, supple, no masses, no thyromegaly tracheostomy in place. Respiratory: Normal respiratory effort with positive crackles appreciated  both lungs fields.  No significant wheezing appreciated. Cardiovascular: Regular rate and rhythm, no murmurs / rubs / gallops.  Trace lower extremity edema. 2+ pedal pulses. No carotid bruits.  Abdomen: Positive right upper quadrant tenderness appreciated no hepatosplenomegaly. Bowel sounds positive.  Musculoskeletal: no clubbing / cyanosis. No joint deformity upper and lower extremities. Good ROM, no contractures. Normal muscle tone.  Skin: no rashes, lesions, ulcers. No induration Neurologic: CN 2-12 grossly intact. Sensation intact, DTR normal. Strength 5/5 in all 4.  Psychiatric: Normal judgment and insight. Alert and oriented x 3. Normal mood.     Labs on Admission: I have personally reviewed following labs and imaging studies  CBC: Recent Labs  Lab 05/07/19 1129  WBC 12.2*  HGB 13.6  HCT 41.6  MCV 97.9  PLT 009   Basic Metabolic Panel: Recent Labs  Lab 05/07/19 1129  NA 137  K 3.2*  CL 92*  CO2 28  GLUCOSE 302*  BUN 10  CREATININE 0.75  CALCIUM 8.8*   GFR: Estimated Creatinine Clearance: 82.8 mL/min (by C-G formula based on SCr of 0.75 mg/dL). Liver Function Tests: Recent Labs  Lab 05/07/19 1129  AST 17  ALT 14  ALKPHOS 117  BILITOT 2.3*  PROT 6.9  ALBUMIN 3.1*   Recent Labs  Lab 05/07/19 1129  LIPASE 17   No results for input(s): AMMONIA in the last 168 hours. Coagulation Profile: No results for input(s): INR, PROTIME in the last 168 hours. Cardiac Enzymes: No results for input(s): CKTOTAL, CKMB, CKMBINDEX, TROPONINI in the last 168 hours. BNP (last 3 results) No results for input(s): PROBNP in the last 8760 hours. HbA1C: No results for input(s): HGBA1C in the last 72 hours. CBG: No results for input(s): GLUCAP in the last 168 hours. Lipid Profile: No results for input(s): CHOL, HDL, LDLCALC, TRIG, CHOLHDL, LDLDIRECT in the last 72 hours. Thyroid Function Tests: No results for input(s): TSH, T4TOTAL, FREET4, T3FREE, THYROIDAB in the last 72  hours. Anemia Panel: No results for input(s): VITAMINB12, FOLATE, FERRITIN, TIBC, IRON, RETICCTPCT in the last 72 hours. Urine analysis:    Component Value Date/Time   COLORURINE AMBER (A) 04/23/2019 1830   APPEARANCEUR HAZY (A) 04/23/2019 1830   LABSPEC 1.025 04/23/2019 1830   PHURINE 5.0 04/23/2019 1830  GLUCOSEU 50 (A) 04/23/2019 1830   HGBUR NEGATIVE 04/23/2019 1830   [place] NEGATIVE 04/23/2019 [place] 04/23/2019 1830   PROTEINUR 100 (A) 04/23/2019 1830   NITRITE NEGATIVE 04/23/2019 1830   LEUKOCYTESUR NEGATIVE 04/23/2019 1830   Sepsis Labs: No results found for this or any previous visit (from the past 240 hour(s)).   Radiological Exams on Admission: Dg Chest 2 View  Result Date: 05/07/2019 CLINICAL DATA:  Shortness of breath EXAM: CHEST - 2 VIEW COMPARISON:  Jan 12, 2019 FINDINGS: Again noted is a tracheostomy tube is at the level of the clavicular heads. There is increased interstitial markings with pulmonary vascular congestion. Blunting of the bilateral costophrenic angles, likely trace effusions. Again noted is marked cardiomegaly. IMPRESSION: Cardiomegaly with interstitial edema and trace bilateral pleural effusions. Electronically Signed   By: Prudencio Pair M.D.   On: 05/07/2019 13:20   US Abdomen Limited Ruq  Result Date: 05/07/2019 CLINICAL DATA:  Right upper quadrant pain EXAM: ULTRASOUND ABDOMEN LIMITED RIGHT UPPER QUADRANT COMPARISON:  None. FINDINGS: Gallbladder: There is a shadowing gallstone present measuring 1 cm. No wall thickening visualized. No sonographic Murphy sign noted by sonographer. Common bile duct: Diameter: 3 mm Liver: No focal lesion identified. Within normal limits in parenchymal echogenicity. Portal vein is patent on color Doppler imaging with normal direction of blood flow towards the liver. Other: None. IMPRESSION: Cholelithiasis without evidence of acute cholecystitis Electronically Signed   By: Prudencio Pair M.D.   On:  05/07/2019 13:48      Assessment/Plan Cholelithiasis with suspected cholecystitis: Acute.  Patient presented with complaints of right-sided abdominal pain with radiation to her back.  Ultrasound reveals cholelithiasis without acute cholecystitis. -Admit to a medical telemetry bed -Continue metronidazole and cefepime per general surgery recommendation -Pain control -Appreciate general surgery consultative services -Agree with complaints for cardiology and pulmonary input on perioperative risk assessment   Systolic congestive heart failure exacerbation: Acute.  Patient reports feeling more short of breath.  Chest x-ray showing cardiomegaly with interstitial edema and trace effusions.  BNP elevated at 2264.5.  Last EF noted to be 40 to 45% in  May 2020. -Strict intake and output and daily weights  -Lasix 80 mg IV twice daily -Cardiology formally consulted, follow-up for further recommendation  Acute on chronic respiratory failure with hypoxia, COPD, trach dependent: Acute.  At baseline patient supposed to be on 3 to 4 L, but requiring 8 L to maintain O2 saturations.  Suspect secondary to patient being fluid overload. -Continuous pulse oximetry with nasal cannula oxygen -Wean as tolerated to home setting -Breathing treatments as needed  DVT and lupus anticoagulant /Subtherapeutic INR : Patient with history of left lower extremity DVT and lupus anticoagulant which she is supposed to be on Coumadin. Reports taking Coumadin except for the last 2 days due to nausea and vomiting symptoms.  However, on admission INR 1.3.  -Heparin per pharmacy for possible need of procedure  Hypokalemia: Acute.  Initial potassium noted to be 3.2. -Give 40 mEq of potassium chloride x1 dose now -Continue home regimen potassium chloride  Insulin-dependent diabetes mellitus: On admission patient's glucose elevated at 302.  Last hemoglobin A1c noted to be 6.3 on 01/13/2019. -Hypoglycemic protocol -Continue home  Tresiba 30 units nightly -CBGs q. before meals with moderate SSI  Anxiety/depression -Continue Zoloft and Ativan as needed  Essential hypertension -Continue losartan and metoprolol  Hyperlipidemia -Continue atorvastatin  CAD: Patient with history of CABG.  Denies any complaints of chest pain at this  time.  Morbid obesity: BMI 45.98 kg/m  DVT prophylaxis: Heparin Code Status: Full Family Communication: Discussed plan of care with the patient and husband present at bedside Disposition Plan: Discharge home in 2 to 3 days once medically stable Consults called: General surgery Admission status: Inpatient  Norval Morton MD [facility] Pager [phone]   If 7PM-7AM, please contact night-coverage [email or web address] Password [facility]  05/07/2019, 4:38 PM

## 2019-05-07 NOTE — Consult Note (Addendum)
[facility] Surgery Consult Note  Theresa [name] [birth year]  [id number].    Requesting MD: Michaell Cowing Chief Complaint/Reason for Consult: cholelithiasis HPI:  Patient is a 53 year old female who presented to [facility] with RUQ pain, nausea, vomiting and diarrhea since yesterday. Pain is severe, intermittent and radiates to back. Patient has been having similar symptoms for the last several weeks but yesterday this became much more severe and persistent. She has been told she has gallstones in the past, HIDA in 2018 was negative per chart. Patient denies fever, chills, chest pain, urinary symptoms. Reports SOB because her abdomen is swollen and it feels difficult to get a good breath in.   PMH significant for chronic respiratory failure s/p tracheostomy in 2002, CAD with stents and s/p CABG 2007, Hx of MI, COPD, Chronic diastolic heart failure, Hx of DVT anticoagulated on coumadin, IDDM, HTN, HLD, morbid obesity. Patient has not taken coumadin today or yesterday since she has been vomiting. Past abdominal surgery includes cesarean section and IR placed G-tube which is no longer present.   ROS: Review of Systems  Constitutional: Negative for chills and fever.  Respiratory: Positive for shortness of breath and wheezing. Negative for cough.   Cardiovascular: Negative for chest pain and palpitations.  Gastrointestinal: Positive for abdominal pain, diarrhea, nausea and vomiting. Negative for blood in stool, constipation and melena.  Genitourinary: Negative for dysuria, frequency and urgency.  Musculoskeletal: Positive for back pain.  All other systems reviewed and are negative.   Family History  Problem Relation Age of Onset  . Hypertension Mother   . Diabetes Mother   . Hypertension Father   . Diabetes Father     Past Medical History:  Diagnosis Date  . Acute kidney failure with lesion of tubular necrosis (HCC)   . Acute systolic heart failure ([place])   . CAD (coronary artery disease)     a. s/p prior PCI. b. CABG 2007 at [facility] in [place] 2007. c. inferior STEMI 10/2015 s/p DES to dSVG-PDA.  . Cardiac arrest ([place])   . Cervical cancer ([place])   . Chronic diastolic CHF (congestive heart failure) ([place])   . Chronic respiratory failure ([place])    s/p tracheostomy 2002  . Chronic RUQ pain   . COPD (chronic obstructive pulmonary disease) ([place])   . Diabetes mellitus ([place])   . DVT (deep venous thrombosis) ([place])   . Endometriosis   . History of gallstones 01/2016   seen on Ultrasound  . History of HIDA scan 11/2016   normal  . HTN (hypertension)   . Hyperlipidemia   . Lupus anticoagulant disorder (HCC)    on coumadin  . Morbid obesity ([place])   . ST elevation (STEMI) myocardial infarction involving right coronary artery ([place]) 10/29/15   stent to VG to PDA  . Toe fracture, right 03/29/2018  . Tracheostomy in place [facility]), chronic since 2002 11/03/2015  . Tracheostomy status [facility])     Past Surgical History:  Procedure Laterality Date  . CARDIAC CATHETERIZATION N/A 10/29/2015   Procedure: Left Heart Cath and Cors/Grafts Angiography;  Surgeon: Burnell Blanks, MD;  Location: [place] CV LAB;  Service: Cardiovascular;  Laterality: N/A;  . CARDIAC CATHETERIZATION  10/29/2015   Procedure: Coronary Stent Intervention;  Surgeon: Burnell Blanks, MD;  Location: [place] CV LAB;  Service: Cardiovascular;;  . CAROTID STENT    . CESAREAN SECTION WITH BILATERAL TUBAL LIGATION    . CORONARY ARTERY BYPASS GRAFT  2007   2V  .  IR GASTROSTOMY TUBE MOD SED  11/13/2018  . RIGHT/LEFT HEART CATH AND CORONARY/GRAFT ANGIOGRAPHY N/A 03/24/2018   Procedure: RIGHT/LEFT HEART CATH AND CORONARY/GRAFT ANGIOGRAPHY;  Surgeon: Belva Crome, MD;  Location: [place] CV LAB;  Service: Cardiovascular;  Laterality: N/A;  . TRACHEOSTOMY      Social History:  reports that she has been smoking cigarettes. She started smoking about 40 years ago. She has been smoking about 0.75 packs  per day. She has never used smokeless tobacco. She reports that she does not drink alcohol or use drugs.  Allergies:  Allergies  Allergen Reactions  . Penicillins Rash    Has patient had a PCN reaction causing immediate rash, facial/tongue/throat swelling, SOB or lightheadedness with hypotension: Yes Has patient had a PCN reaction causing severe rash involving mucus membranes or skin necrosis: No Has patient had a PCN reaction that required hospitalization No Has patient had a PCN reaction occurring within the last 10 years: No If all of the above answers are "NO", then may proceed with Cephalosporin use.   REACTION: rash  . Ciprofloxacin     nausea  . Ibuprofen Rash    swelling in leg     (Not in a hospital admission)   Blood pressure (!) 145/95, pulse (!) 103, temperature 98.9 F (37.2 C), temperature source Oral, resp. rate (!) 22, height 4\' 10"  (1.473 m), weight 99.8 kg, SpO2 90 %. Physical Exam: Physical Exam Constitutional:      Appearance: She is well-developed. She is morbidly obese. She is diaphoretic.  HENT:     Head: Normocephalic and atraumatic.     Right Ear: External ear normal.     Left Ear: External ear normal.     Nose: Nose normal.     Mouth/Throat:     Lips: Pink.     Mouth: Mucous membranes are dry.  Eyes:     General: Lids are normal. No scleral icterus.    Extraocular Movements: Extraocular movements intact.     Conjunctiva/sclera: Conjunctivae normal.  Neck:     Musculoskeletal: Normal range of motion.     Comments: [name] present, c/d/i  Cardiovascular:     Rate and Rhythm: Regular rhythm. Tachycardia present.     Pulses:          Dorsalis pedis pulses are 1+ on the right side and 1+ on the left side.  Pulmonary:     Effort: Tachypnea present.     Breath sounds: Wheezing (bilateral ) present. No rhonchi or rales.  Abdominal:     General: Bowel sounds are normal. There is no distension.     Palpations: Abdomen is soft.     Tenderness: There  is abdominal tenderness in the right upper quadrant. There is guarding. There is no rebound. Positive signs include Murphy's sign.     Comments: Prior G-tube site well healed  Musculoskeletal:     Comments: ROM grossly intact in all 4 extremities  Skin:    General: Skin is warm.     Coloration: Skin is not jaundiced.  Neurological:     Mental Status: She is alert and oriented to person, place, and time.  Psychiatric:        Attention and Perception: Attention and perception normal.        Mood and Affect: Mood is anxious.        Behavior: Behavior is cooperative.     Results for orders placed or performed during the hospital encounter of 05/07/19 (from the past  48 hour(s))  Lipase, blood     Status: None   Collection Time: 05/07/19 11:29 AM  Result Value Ref Range   Lipase 17 11 - 51 U/L    Comment: Performed at [facility] [address], [place], [place] [address]  Comprehensive metabolic panel     Status: Abnormal   Collection Time: 05/07/19 11:29 AM  Result Value Ref Range   Sodium 137 135 - 145 mmol/L   Potassium 3.2 (L) 3.5 - 5.1 mmol/L   Chloride 92 (L) 98 - 111 mmol/L   CO2 28 22 - 32 mmol/L   Glucose, Bld 302 (H) 70 - 99 mg/dL   BUN 10 6 - 20 mg/dL   Creatinine, Ser 0.75 0.44 - 1.00 mg/dL   Calcium 8.8 (L) 8.9 - 10.3 mg/dL   Total Protein 6.9 6.5 - 8.1 g/dL   Albumin 3.1 (L) 3.5 - 5.0 g/dL   AST 17 15 - 41 U/L   ALT 14 0 - 44 U/L   Alkaline Phosphatase 117 38 - 126 U/L   Total Bilirubin 2.3 (H) 0.3 - 1.2 mg/dL   GFR calc non Af Amer >60 >60 mL/min   GFR calc Af Amer >60 >60 mL/min   Anion gap 17 (H) 5 - 15    Comment: Performed at [facility], [place] [address], [place], [place] [address]  CBC     Status: Abnormal   Collection Time: 05/07/19 11:29 AM  Result Value Ref Range   WBC 12.2 (H) 4.0 - 10.5 K/uL   RBC 4.25 3.87 - 5.11 MIL/uL   Hemoglobin 13.6 12.0 - 15.0 g/dL   HCT 41.6 36.0 - 46.0 %   MCV 97.9 80.0 - 100.0 fL   MCH 32.0 26.0 - 34.0 pg    MCHC 32.7 30.0 - 36.0 g/dL   RDW 18.1 (H) 11.5 - 15.5 %   Platelets 333 150 - 400 K/uL   nRBC 0.0 0.0 - 0.2 %    Comment: Performed at [facility], [place] [address], [place], [place] [address]  I-Stat beta hCG blood, ED     Status: Abnormal   Collection Time: 05/07/19 11:56 AM  Result Value Ref Range   I-stat hCG, quantitative 8.2 (H) <5 mIU/mL   Comment 3            Comment:   GEST. AGE      CONC.  (mIU/mL)   <=1 WEEK        5 - 50     2 WEEKS       50 - 500     3 WEEKS       100 - 10,000     4 WEEKS     1,000 - 30,000        FEMALE AND NON-PREGNANT FEMALE:     LESS THAN 5 mIU/mL    Dg Chest 2 View  Result Date: 05/07/2019 CLINICAL DATA:  Shortness of breath EXAM: CHEST - 2 VIEW COMPARISON:  Jan 12, 2019 FINDINGS: Again noted is a tracheostomy tube is at the level of the clavicular heads. There is increased interstitial markings with pulmonary vascular congestion. Blunting of the bilateral costophrenic angles, likely trace effusions. Again noted is marked cardiomegaly. IMPRESSION: Cardiomegaly with interstitial edema and trace bilateral pleural effusions. Electronically Signed   By: Prudencio Pair M.D.   On: 05/07/2019 13:20   US Abdomen Limited Ruq  Result Date: 05/07/2019 CLINICAL DATA:  Right upper quadrant  pain EXAM: ULTRASOUND ABDOMEN LIMITED RIGHT UPPER QUADRANT COMPARISON:  None. FINDINGS: Gallbladder: There is a shadowing gallstone present measuring 1 cm. No wall thickening visualized. No sonographic Murphy sign noted by sonographer. Common bile duct: Diameter: 3 mm Liver: No focal lesion identified. Within normal limits in parenchymal echogenicity. Portal vein is patent on color Doppler imaging with normal direction of blood flow towards the liver. Other: None. IMPRESSION: Cholelithiasis without evidence of acute cholecystitis Electronically Signed   By: Prudencio Pair M.D.   On: 05/07/2019 13:48      Assessment/Plan COPD Chronic respiratory failure s/p trach CAD/Hx of MI  s/p stents and CABD 3546 Chronic diastolic CHF Hx of DVT  Anticoagulated on coumadin - INR pending, last dose was 8/29 HTN HLD IDDM Morbid Obesity   Cholelithiasis with RUQ pain  - RUQ [place] with stones but no cholecystitis, CT earlier this month with cholelithiasis but no cholecystitis - Hx of HIDA in 2018 which was normal - WBC mildly elevated at 12, afebrile - clinically patient very ttp in RUQ - likely has cholecystitis, will start IV abx - patient needs cardiology and pulmonology to see to assess if she could even tolerate surgery and what her perioperative risk is - if acutely worsens would recommend IR consult for percutaneous cholecystostomy   FEN: ok to have liquids from a general surgery standpoint VTE: SCDs, would recommend heparin gtt if it is felt she needs chemical prophylaxis  ID: recommend cefepime and flagyl since patient is PCN allergic   Recommend medical admission given multiple medical co-morbidities and we will follow as consultants.   Brigid Re, [facility] Surgery 05/07/2019, 3:04 PM Pager: [phone] Consults: [phone]

## 2019-05-07 NOTE — ED Provider Notes (Signed)
[place] EMERGENCY DEPARTMENT Provider Note   CSN: [id number] Arrival date & time: 05/07/19  1048     History   Chief Complaint Chief Complaint  Patient presents with  . Multiple Symptoms    HPI Theresa [name] is a 53 y.o. female.     HPI  Patient with a PMH of COPD and systolic HF presents with a cc of right-sided/abdominal pain and SOB for the past 2 days. She states that her pain is occurring in her right flank, right side, epigastrium, RUQ, and RLQ. Patient also endorses thoracic back pain that intensifies upon RUQ/epigastric palpation. She endorses diarrhea x 3 and vomiting x 3 since yesterday. She states her diarrhea is brown in color and her vomit looks like the food she ate. Patient also states that her abdomen has increased in size. She describes her shortness of breath as "difficult to get air in" and also endorses a productive cough that produces clear sputum. She endorses chills and diaphoresis, but denies chest pain, headache, myalgias, lightheadedness, dizziness, syncope, or constipation. Patient reports that she had an ED visit here a few weeks ago and was told that she had gallstones. Patient has a trach in place.   Past Medical History:  Diagnosis Date  . Acute kidney failure with lesion of tubular necrosis (HCC)   . Acute systolic heart failure ([place])   . CAD (coronary artery disease)    a. s/p prior PCI. b. CABG 2007 at [facility] in [place] 2007. c. inferior STEMI 10/2015 s/p DES to dSVG-PDA.  . Cardiac arrest ([place])   . Cervical cancer ([place])   . Chronic diastolic CHF (congestive heart failure) ([place])   . Chronic respiratory failure ([place])    s/p tracheostomy 2002  . Chronic RUQ pain   . COPD (chronic obstructive pulmonary disease) ([place])   . Diabetes mellitus ([place])   . DVT (deep venous thrombosis) ([place])   . Endometriosis   . History of gallstones 01/2016   seen on Ultrasound  . History of HIDA scan 11/2016   normal  . HTN  (hypertension)   . Hyperlipidemia   . Lupus anticoagulant disorder (HCC)    on coumadin  . Morbid obesity ([place])   . ST elevation (STEMI) myocardial infarction involving right coronary artery ([place]) 10/29/15   stent to VG to PDA  . Toe fracture, right 03/29/2018  . Tracheostomy in place [facility]), chronic since 2002 11/03/2015  . Tracheostomy status [facility])     Patient Active Problem List   Diagnosis Date Noted  . Adult body mass index 50.0-59.9 ([place]) 03/22/2019  . Anxiety 03/22/2019  . Low back pain 03/22/2019  . Cigarette nicotine dependence without complication 56/31/4970  . History of vitamin D deficiency 03/22/2019  . Insomnia 03/22/2019  . Knee pain 03/22/2019  . Localized, primary osteoarthritis of lower leg, unspecified laterality 03/22/2019  . Neck pain 03/22/2019  . Psoriasis 03/22/2019  . Thrombophlebitis of deep veins of lower extremity ([place]) 03/22/2019  . Deep vein thrombosis ([place]) 03/22/2019  . Fracture of humeral shaft, left, closed 03/22/2019  . Chronic respiratory insufficiency 01/19/2019  . CAD (coronary artery disease) 01/15/2019  . Educated About Covid-19 Virus Infection 01/12/2019  . Acute on chronic combined systolic and diastolic CHF (congestive heart failure) ([place]) 01/12/2019  . Acute on chronic respiratory failure with hypoxia ([place]) 10/24/2018  . Pulmonary hypertension, unspecified ([place])   . Acute on chronic respiratory failure with hypoxia ([place]) 08/01/2017  . Uncontrolled type 2 diabetes mellitus  with hyperglycemia, with long-term current use of insulin ([place]) 08/01/2017  . Dyspnea 07/31/2017  . Type 2 diabetes mellitus with hyperglycemia ([place]) 01/22/2016  . CAD (coronary artery disease) of artery bypass graft: PTCA/DES to distal body VG to PDA 10/29/15 11/03/2015  . Tracheostomy in place [facility]), chronic since 2002 11/03/2015  . Hypokalemia 11/03/2015  . Lupus anticoagulant syndrome ([place])   . Chronic diastolic CHF (congestive heart failure) ([place])   . Respiratory  failure ([place])   . Coronary artery disease involving coronary bypass graft of native heart without angina pectoris   . OSA (obstructive sleep apnea)   . COPD exacerbation ([place])   . Tobacco abuse   . DM (diabetes mellitus) ([place]) 06/11/2009  . Hyperlipidemia LDL goal <70 06/11/2009  . DVT 06/11/2009  . History of CABG 06/11/2009  . Essential hypertension 05/27/2009  . CAD, NATIVE VESSEL 05/27/2009    Past Surgical History:  Procedure Laterality Date  . CARDIAC CATHETERIZATION N/A 10/29/2015   Procedure: Left Heart Cath and Cors/Grafts Angiography;  Surgeon: Burnell Blanks, MD;  Location: [place] CV LAB;  Service: Cardiovascular;  Laterality: N/A;  . CARDIAC CATHETERIZATION  10/29/2015   Procedure: Coronary Stent Intervention;  Surgeon: Burnell Blanks, MD;  Location: [place] CV LAB;  Service: Cardiovascular;;  . CAROTID STENT    . CESAREAN SECTION WITH BILATERAL TUBAL LIGATION    . CORONARY ARTERY BYPASS GRAFT  2007   2V  . IR GASTROSTOMY TUBE MOD SED  11/13/2018  . RIGHT/LEFT HEART CATH AND CORONARY/GRAFT ANGIOGRAPHY N/A 03/24/2018   Procedure: RIGHT/LEFT HEART CATH AND CORONARY/GRAFT ANGIOGRAPHY;  Surgeon: Belva Crome, MD;  Location: [place] CV LAB;  Service: Cardiovascular;  Laterality: N/A;  . TRACHEOSTOMY       OB History   No obstetric history on file.      Home Medications    Prior to Admission medications   Medication Sig Start Date End Date Taking? Authorizing Provider  albuterol (PROVENTIL) (2.5 MG/3ML) 0.083% nebulizer solution Take 3 mLs (2.5 mg total) by nebulization 2 (two) times daily. Patient taking differently: Take 2.5 mg by nebulization every 4 (four) hours as needed for wheezing.  10/19/18   Erick Colace, NP  atorvastatin (LIPITOR) 20 MG tablet Take 1 tablet (20 mg total) by mouth daily. 01/19/19 01/19/20  Sande Rives E, PA-C  famotidine (PEPCID) 20 MG tablet Take 20 mg by mouth daily.    [provider]  gabapentin  (NEURONTIN) 300 MG capsule Take 1 capsule (300mg ) in the morning, 1 capsule (300mg ) in the afternoon, and 2 capsules (600mg ) at bedtime 01/19/19   Darreld Mclean, PA-C  HYDROcodone-acetaminophen (NORCO/VICODIN) 5-325 MG tablet Take 1 tablet by mouth every 4 (four) hours as needed for moderate pain. 03/22/19   Marybelle Killings, MD  Insulin Degludec (TRESIBA FLEXTOUCH [place]) Inject 30 Units into the skin every morning.    [provider]  LORazepam (ATIVAN) 0.5 MG tablet Take 0.5 mg by mouth every 6 (six) hours as needed for anxiety.     [provider]  losartan (COZAAR) 25 MG tablet Take 1 tablet (25 mg total) by mouth daily. 01/20/19   Sande Rives E, PA-C  metoprolol succinate (TOPROL-XL) 25 MG 24 hr tablet Take 25 mg by mouth daily.    [provider]  nitroGLYCERIN (NITROSTAT) 0.4 MG SL tablet Place 0.4 mg under the tongue every 5 (five) minutes as needed for chest pain.    [provider]  potassium chloride (  K-DUR) 10 MEQ tablet Take 1 tablet (10 mEq total) by mouth 2 (two) times daily. 01/19/19   Sande Rives E, PA-C  sertraline (ZOLOFT) 50 MG tablet Take 50 mg by mouth daily.    [provider]  tamsulosin (FLOMAX) 0.4 MG CAPS capsule Take 0.4 mg by mouth daily.    [provider]  torsemide (DEMADEX) 20 MG tablet Take 4 tablets (80mg ) daily. Take an additional 4 tablets (80mg  daily) in the evening as needed if you have had > 3lb weight gain overnight. 01/19/19   Sande Rives E, PA-C  warfarin (COUMADIN) 1 MG tablet Take 1 tablet (1 mg total) by mouth daily at 6 PM. Take 1mg  tablet in addition to the 5mg  tablet for a total of 6mg  daily. 01/19/19   Darreld Mclean, PA-C  warfarin (COUMADIN) 5 MG tablet Take 1 tablet (5 mg total) by mouth one time only at 6 PM. Take 5mg  tablet in addition to the 1mg  tablet for a total of 6mg  daily. 01/19/19   Darreld Mclean, PA-C    Family History Family History  Problem Relation Age of Onset  .  Hypertension Mother   . Diabetes Mother   . Hypertension Father   . Diabetes Father     Social History Social History   Tobacco Use  . Smoking status: Current Every Day Smoker    Packs/day: 0.75    Types: Cigarettes    Start date: 10/23/1978  . Smokeless tobacco: Never Used  . Tobacco comment: 1/2 pack - trying to cut back   Substance Use Topics  . Alcohol use: No    Alcohol/week: 0.0 standard drinks  . Drug use: No     Allergies   Penicillins, Ciprofloxacin, and Ibuprofen   Review of Systems Review of Systems All other systems negative except as documented in the HPI. All pertinent positives and negatives as reviewed in the HPI.  Physical Exam Updated Vital Signs BP (!) 134/92   Pulse 96   Temp 98.9 F (37.2 C) (Oral)   Resp (!) 22   Ht 4\' 10"  (1.473 m)   Wt 99.8 kg   SpO2 91%   BMI 45.98 kg/m   Physical Exam Constitutional:      General: She is awake.     Appearance: Normal appearance. She is obese.  HENT:     Head: Normocephalic and atraumatic.     Right Ear: Tympanic membrane normal.     Left Ear: Tympanic membrane normal.     Nose: Nose normal.     Mouth/Throat:     Mouth: Mucous membranes are moist.  Eyes:     Pupils: Pupils are equal, round, and reactive to light.  Cardiovascular:     Rate and Rhythm: Normal rate and regular rhythm.     Pulses: Normal pulses.     Heart sounds: Normal heart sounds.     Comments: S1 and S2 noted. No murmurs, rubs, or gallops noted. Dorsalis pedis and posterior tibial pulses unable to be palpated.  Pulmonary:     Effort: Tachypnea, accessory muscle usage and prolonged expiration present.     Comments: Patient has a trach in place. Breathing is labored.  Abdominal:     General: Bowel sounds are normal. There is no distension or abdominal bruit.     Palpations: Abdomen is soft.     Tenderness: There is abdominal tenderness in the right upper quadrant, right lower quadrant and epigastric area. There is guarding.  There is no  right CVA tenderness, left CVA tenderness or rebound. Positive signs include Murphy's sign. Negative signs include psoas sign.     Comments: Abdomen slightly dull to percussion. Red circular skin lesion noted above umbilicus.   Musculoskeletal:     Right lower leg: 1+ Pitting Edema present.     Left lower leg: 1+ Pitting Edema present.  Skin:    General: Skin is warm and dry.     Comments: Red/pink scaly psoriatic plaques noted on bilateral upper and lower extremities. No other rash or lesions noted.  Neurological:     Mental Status: She is alert.  Psychiatric:        Attention and Perception: Attention normal.        Mood and Affect: Mood is anxious.        Speech: Speech normal.        Behavior: Behavior is cooperative.      ED Treatments / Results  Labs (all labs ordered are listed, but only abnormal results are displayed) Labs Reviewed  COMPREHENSIVE METABOLIC PANEL - Abnormal; Notable for the following components:      Result Value   Potassium 3.2 (*)    Chloride 92 (*)    Glucose, Bld 302 (*)    Calcium 8.8 (*)    Albumin 3.1 (*)    Total Bilirubin 2.3 (*)    Anion gap 17 (*)    All other components within normal limits  CBC - Abnormal; Notable for the following components:   WBC 12.2 (*)    RDW 18.1 (*)    All other components within normal limits  I-STAT BETA HCG BLOOD, ED (MC, [facility], AP ONLY) - Abnormal; Notable for the following components:   I-stat hCG, quantitative 8.2 (*)    All other components within normal limits  LIPASE, BLOOD  URINALYSIS, ROUTINE W REFLEX MICROSCOPIC    EKG None  Radiology Dg Chest 2 View  Result Date: 05/07/2019 CLINICAL DATA:  Shortness of breath EXAM: CHEST - 2 VIEW COMPARISON:  Jan 12, 2019 FINDINGS: Again noted is a tracheostomy tube is at the level of the clavicular heads. There is increased interstitial markings with pulmonary vascular congestion. Blunting of the bilateral costophrenic angles, likely trace effusions.  Again noted is marked cardiomegaly. IMPRESSION: Cardiomegaly with interstitial edema and trace bilateral pleural effusions. Electronically Signed   By: Prudencio Pair M.D.   On: 05/07/2019 13:20    Procedures Procedures (including critical care time)  Medications Ordered in ED Medications - No data to display   Initial Impression / Assessment and Plan / ED Course  I have reviewed the triage vital signs and the nursing notes.  Pertinent labs & imaging results that were available during my care of the patient were reviewed by me and considered in my medical decision making (see chart for details).        Patient will be admitted to the hospital by the hospitalist team and also consulted surgery for her gallbladder.  I feel that she is having gallbladder related issues she also has respiratory issues that need further evaluation and care.  Patient has been stable thus far in the emergency department.  Final Clinical Impressions(s) / ED Diagnoses   Final diagnoses:  RUQ pain    ED Discharge Orders    None       Dalia Heading, PA-C 05/07/19 [place], Gwenyth Allegra, MD 05/07/19 2010

## 2019-05-07 NOTE — Consult Note (Signed)
CARDIOLOGY CONSULT NOTE   Patient ID: Theresa [name] MRN: [id number], DOB/AGE: 11-07-1965   Admit date: 05/07/2019 Date of Consult: 05/07/2019   Primary Physician: Theresa Blitz, MD Primary Cardiologist: Theresa [name]  Consulting Physician: Theresa [name] Reason for Consult: cardiovascular risk assessment prior to surgery  Pt. Profile 53 yo female w/ known CAD (s/p CABG '07 and STEMI PCI '17), chronic respiratory failure 2/2 COPD requiring trach ('02), lupus anticoagulant with DVTs (on warfarin) and HFmrEF (63%) with diastolic dysfunction who presents with abdominal pain, nausea, vomiting, found to have cholelithasis with suspected cholecytisis  Problem List  Past Medical History:  Diagnosis Date   Acute kidney failure with lesion of tubular necrosis (HCC)    Acute systolic heart failure (HCC)    CAD (coronary artery disease)    a. s/p prior PCI. b. CABG 2007 at [facility] in [place] 2007. c. inferior STEMI 10/2015 s/p DES to dSVG-PDA.   Cardiac arrest [facility])    Cervical cancer (HCC)    Chronic diastolic CHF (congestive heart failure) (HCC)    Chronic respiratory failure (HCC)    s/p tracheostomy 2002   Chronic RUQ pain    COPD (chronic obstructive pulmonary disease) (HCC)    Diabetes mellitus ([place])    DVT (deep venous thrombosis) ([place])    Endometriosis    History of gallstones 01/2016   seen on Ultrasound   History of HIDA scan 11/2016   normal   HTN (hypertension)    Hyperlipidemia    Lupus anticoagulant disorder (HCC)    on coumadin   Morbid obesity (HCC)    ST elevation (STEMI) myocardial infarction involving right coronary artery ([place]) 10/29/15   stent to VG to PDA   Toe fracture, right 03/29/2018   Tracheostomy in place [facility]), chronic since 2002 11/03/2015   Tracheostomy status [facility])     Past Surgical History:  Procedure Laterality Date   CARDIAC CATHETERIZATION N/A 10/29/2015   Procedure: Left Heart Cath and Cors/Grafts  Angiography;  Surgeon: Burnell Blanks, MD;  Location: [facility];  Service: Cardiovascular;  Laterality: N/A;   CARDIAC CATHETERIZATION  10/29/2015   Procedure: Coronary Stent Intervention;  Surgeon: Burnell Blanks, MD;  Location: [facility] [facility];  Service: Cardiovascular;;   CAROTID STENT     CESAREAN SECTION WITH BILATERAL TUBAL LIGATION     CORONARY ARTERY BYPASS GRAFT  2007   2V   IR GASTROSTOMY TUBE MOD SED  11/13/2018   RIGHT/LEFT HEART CATH AND CORONARY/GRAFT ANGIOGRAPHY N/A 03/24/2018   Procedure: RIGHT/LEFT HEART CATH AND CORONARY/GRAFT ANGIOGRAPHY;  Surgeon: Belva Crome, MD;  [facility];  Service: Cardiovascular;  Laterality: N/A;   TRACHEOSTOMY       Allergies  Allergies  Allergen Reactions   Penicillins Rash    Has patient had a PCN reaction causing immediate rash, facial/tongue/throat swelling, SOB or lightheadedness with hypotension: Yes Has patient had a PCN reaction causing severe rash involving mucus membranes or skin necrosis: No Has patient had a PCN reaction that required hospitalization No Has patient had a PCN reaction occurring within the last 10 years: No If all of the above answers are "NO", then may proceed with Cephalosporin use.   REACTION: rash   Ciprofloxacin     nausea   Ibuprofen Rash    swelling in leg     HPI  Ms. Theresa [name] reports over the last 4-5 days increasing abdominal pain, mainly in RUQ and epigastric. Worse with eating, however  now constant. Accompanies with nausea and vomiting - unable to keep food down including medications. Has not been able to take her medications, including diuretics. She reports about 4-5 lb weight gain, increasing abdominal swelling and worsening orthopnea. Now with 5 pillow, when before was 2-3 pillow. Worsening PND with multiple wakings at night over last 2 days (since unable to keep down her diuretics)  Last saw Dr. Percival [name] in 03/27/2019 via telehealth. Stable  then without any need for medication changes.   She is able to walk around her house, clean dishes and put clothing into washing machine - but unable to get them out. Able to walk up 1 flight of stairs but with significant shortness of breath. No chest pain. Last episode of chest pain in 2017 when she presented with ACS. However again, limited exercise capacity.    Inpatient Medications   atorvastatin  20 mg Oral Daily   famotidine  20 mg Oral Daily   furosemide  80 mg Intravenous BID   [START ON 05/08/2019] gabapentin  300 mg Oral BID   gabapentin  600 mg Oral QHS   heparin  3,500 Units Intravenous Once   [START ON 05/08/2019] insulin aspart  0-15 Units Subcutaneous TID WC   [START ON 05/08/2019] insulin glargine  30 Units Subcutaneous q morning - 10a   losartan  25 mg Oral Daily   metoprolol succinate  25 mg Oral Daily   potassium chloride  10 mEq Oral BID   sertraline  50 mg Oral Daily   sodium chloride flush  3 mL Intravenous Q12H   tamsulosin  0.4 mg Oral Daily    Family History Family History  Problem Relation Age of Onset   Hypertension Mother    Diabetes Mother    Hypertension Father    Diabetes Father      Social History Social History   Socioeconomic History   Marital status: Married    Spouse name: Not on file   Number of children: Not on file   Years of education: Not on file   Highest education level: Not on file  Occupational History   Not on file  Social Needs   Financial resource strain: Not on file   Food insecurity    Worry: Not on file    Inability: Not on file   Transportation needs    Medical: Not on file    Non-medical: Not on file  Tobacco Use   Smoking status: Current Every Day Smoker    Packs/day: 0.75    Types: Cigarettes    Start date: 10/23/1978   Smokeless tobacco: Never Used   Tobacco comment: 1/2 pack - trying to cut back   Substance and Sexual Activity   Alcohol use: No    Alcohol/week: 0.0 standard  drinks   Drug use: No   Sexual activity: Not on file  Lifestyle   Physical activity    Days per week: Not on file    Minutes per session: Not on file   Stress: Not on file  Relationships   Social connections    Talks on phone: Not on file    Gets together: Not on file    Attends religious service: Not on file    Active member of club or organization: Not on file    Attends meetings of clubs or organizations: Not on file    Relationship status: Not on file   Intimate partner violence    Fear of current or ex partner: Not on  file    Emotionally abused: Not on file    Physically abused: Not on file    Forced sexual activity: Not on file  Other Topics Concern   Not on file  Social History Narrative   Not on file     Review of Systems  General:  No chills, fever, night sweats or weight changes.  Cardiovascular:  No chest pain, +dyspnea on exertion, +edema, +orthopnea, no palpitations, + paroxysmal nocturnal dyspnea. Dermatological: No rash, lesions/masses Respiratory: No cough, dyspnea Urologic: No hematuria, dysuria Abdominal:   +nausea, +vomiting, no diarrhea, bright red blood per rectum, melena, or hematemesis Neurologic:  No visual changes, wkns, changes in mental status. All other systems reviewed and are otherwise negative except as noted above.  Physical Exam  Blood pressure 105/81, pulse 95, temperature 97.9 F (36.6 C), temperature source Oral, resp. rate 20, height 4\' 10"  (1.473 m), weight 99.8 kg, SpO2 94 %.  General: Pleasant, NAD Psych: Normal affect. Neuro: Alert and oriented X 3. Moves all extremities spontaneously. HEENT: trach in place, with O2 Neck: +JVD to jaw at 40 degrees Lungs:  Upper airway sounds, but also crackles at bases Heart: RRR Abdomen: Soft, some MEG and RUQ tenderness. Also some abd edema notes Extremities: 1+ edema thighs. DP/PT/Radials 2+ and equal bilaterally.  Labs  No results for input(s): CKTOTAL, CKMB, TROPONINI in the  last 72 hours. Lab Results  Component Value Date   WBC 12.2 (H) 05/07/2019   HGB 13.6 05/07/2019   HCT 41.6 05/07/2019   MCV 97.9 05/07/2019   PLT 333 05/07/2019    Recent Labs  Lab 05/07/19 1129  NA 137  K 3.2*  CL 92*  CO2 28  BUN 10  CREATININE 0.75  CALCIUM 8.8*  PROT 6.9  BILITOT 2.3*  ALKPHOS 117  ALT 14  AST 17  GLUCOSE 302*   Lab Results  Component Value Date   CHOL 167 03/24/2018   HDL 44 03/24/2018   LDLCALC 103 (H) 03/24/2018   TRIG 101 03/24/2018   No results found for: DDIMER  Radiology/Studies  Dg Chest 2 View  Result Date: 05/07/2019 CLINICAL DATA:  Shortness of breath EXAM: CHEST - 2 VIEW COMPARISON:  Jan 12, 2019 FINDINGS: Again noted is a tracheostomy tube is at the level of the clavicular heads. There is increased interstitial markings with pulmonary vascular congestion. Blunting of the bilateral costophrenic angles, likely trace effusions. Again noted is marked cardiomegaly. IMPRESSION: Cardiomegaly with interstitial edema and trace bilateral pleural effusions. Electronically Signed   By: Prudencio Pair M.D.   On: 05/07/2019 13:20   Ct Renal Stone Study  Result Date: 04/23/2019 CLINICAL DATA:  53 year old female with acute RIGHT flank, abdominal and back pain for 1 week. EXAM: CT ABDOMEN AND PELVIS WITHOUT CONTRAST TECHNIQUE: Multidetector CT imaging of the abdomen and pelvis was performed following the standard protocol without IV contrast. COMPARISON:  11/21/2018 and prior CTs FINDINGS: Please note that parenchymal and vascular abnormalities may be missed without intravenous contrast. Lower chest: Bibasilar scarring/atelectasis and ground-glass opacities are unchanged. Cardiomegaly again noted. Hepatobiliary: The liver is unremarkable. Cholelithiasis noted without CT evidence of acute cholecystitis. No biliary dilatation. Pancreas: Unremarkable Spleen: Unremarkable Adrenals/Urinary Tract: The kidneys, adrenal glands and bladder are unremarkable except for  stable LEFT adrenal adenoma. There is no evidence of hydronephrosis or urinary calculi. Stomach/Bowel: Stomach is within normal limits. Appendix appears normal. No evidence of bowel wall thickening, distention, or inflammatory changes. Vascular/Lymphatic: Aortic atherosclerosis. No enlarged abdominal or pelvic lymph  nodes. Reproductive: Uterus and bilateral adnexa are unremarkable. Other: No ascites,, focal collection or pneumoperitoneum. Musculoskeletal: No acute or suspicious bony abnormalities. IMPRESSION: 1. No acute abnormality. No CT findings to suggest a cause for this patient's RIGHT abdominal pain. 2. Cholelithiasis without CT evidence of acute cholecystitis. 3. Cardiomegaly and chronic bibasilar pulmonary scarring/atelectasis/ground-glass opacities. 4.  Aortic Atherosclerosis (ICD10-I70.0). Electronically Signed   By: Margarette Canada M.D.   On: 04/23/2019 18:19   Xr Humerus Left  Result Date: 04/19/2019 2 view x-rays left humerus demonstrates refracture through the old fracture site without evidence of callus formation in comparison to 03/22/2019 images. Impression: persistent fracture proximal humerus shaft without evidence of radiographic union.  US Abdomen Limited Ruq  Result Date: 05/07/2019 CLINICAL DATA:  Right upper quadrant pain EXAM: ULTRASOUND ABDOMEN LIMITED RIGHT UPPER QUADRANT COMPARISON:  None. FINDINGS: Gallbladder: There is a shadowing gallstone present measuring 1 cm. No wall thickening visualized. No sonographic Murphy sign noted by sonographer. Common bile duct: Diameter: 3 mm Liver: No focal lesion identified. Within normal limits in parenchymal echogenicity. Portal vein is patent on color Doppler imaging with normal direction of blood flow towards the liver. Other: None. IMPRESSION: Cholelithiasis without evidence of acute cholecystitis Electronically Signed   By: Prudencio Pair M.D.   On: 05/07/2019 13:48    ECG Ordered and pending  ASSESSMENT AND [name] 53 yo female w/ known  CAD (s/p CABG '07 and STEMI PCI '17), chronic respiratory failure 2/2 COPD requiring trach ('02), lupus anticoagulant with DVTs (on warfarin) and HFmrEF (02%) with diastolic dysfunction who presents with cholelithasis with suspected cholecystis. Cardiology consults for CV risk assessment prior to surgery  # CV Risk Assessment: clinical factors: RCRI 3 (elevated risk) and NSQIP (above average) given comorbidities. Surgery factors: intermediate risk (1-5%). Functional status is 2-3 METS. Last echo 01/2019 was stable compared to 10/2018 echo. Change in clinical symptoms now 2/2 inability to take diuretics with nausea/vomiting. No need for repeat echo.  - as patient is able to do <4 METS and expected to undergo intermediate surgery (intraperitoneal), would consider non-invasive stress testing like nuclear perfusion looking for only for large area of anterior ischemic territory (has known inferior/basal hypokensis on echo) or transient ischemic dilation. Otherwise, even if mildly abnormal this would be treated medically and without interventions - continue statin, hold ARB day of operation, continue metoprolol and continue heparin for lupus anticoagulant/DVTs  # HFmid-range EF/diastolic dysfunction: volume overloaded. Would try to get euvolemic prior to surgery given known advanced respiratory disease. HF symptoms 2/2 inadequate diuretics at home  - agree with IV lasix and monitor output/electrolytes. Will likely need 1-2 day prior to surgery.   Team will re-assess volume status during daytime.  Signed, Charlies Silvers, MD

## 2019-05-07 NOTE — Progress Notes (Addendum)
Pharmacy Antibiotic Note  Theresa [name] is a 53 y.o. female admitted on 05/07/2019 with possible cholecystitis.  Pharmacy has been consulted for cefepime dosing. Hx of penicillin allergy with rash but has tolerated cefepime in the past.  Afebrile, Tmax 98.9. WBC elevated 12.2. Scr 0.75, renal fxn stable.  Plan: Cefepime 2 gm IV q8h Monitor closely for allergic rxn F/U renal fxn, cxs, clinical improvement, and abx de-escalation if needed  Height: 4\' 10"  (147.3 cm) Weight: 220 lb (99.8 kg) IBW/kg (Calculated) : 40.9  Temp (24hrs), Avg:98.9 F (37.2 C), Min:98.9 F (37.2 C), Max:98.9 F (37.2 C)  Recent Labs  Lab 05/07/19 1129  WBC 12.2*  CREATININE 0.75    Estimated Creatinine Clearance: 82.8 mL/min (by C-G formula based on SCr of 0.75 mg/dL).    Allergies  Allergen Reactions  . Penicillins Rash    Has patient had a PCN reaction causing immediate rash, facial/tongue/throat swelling, SOB or lightheadedness with hypotension: Yes Has patient had a PCN reaction causing severe rash involving mucus membranes or skin necrosis: No Has patient had a PCN reaction that required hospitalization No Has patient had a PCN reaction occurring within the last 10 years: No If all of the above answers are "NO", then may proceed with Cephalosporin use.   REACTION: rash  . Ciprofloxacin     nausea  . Ibuprofen Rash    swelling in leg     Antimicrobials this admission: 8/31 Flagyl >> 8/31 Cefepime >>  Microbiology results: None ordered  Thank you for allowing pharmacy to be a part of this patient's care.  Berenice Bouton, PharmD PGY1 Pharmacy Resident Office phone: [phone] Phone until 9 pm: [phone] 05/07/2019 4:13 PM

## 2019-05-07 NOTE — ED Notes (Signed)
Pt having more difficulty catching her breath. O2 was 86% on 4L I slowly increased her oxygen to 8 L and her o2 sustained at 90-91%.

## 2019-05-07 NOTE — ED Triage Notes (Signed)
Pt reports she was here several weeks ago and diagnosed with gallbladder disease. Pt reports she is not getting any better and now she is having increased swelling.

## 2019-05-07 NOTE — ED Notes (Signed)
Mary RN to give call back to receive report

## 2019-05-07 NOTE — ED Notes (Signed)
ED TO INPATIENT HANDOFF REPORT  ED Nurse Name and Phone #:   S Name/Age/Gender Theresa [name] 53 y.o. female Room/Bed: 017C/017C  Code Status   Code Status: Full Code  Home/SNF/Other Home Patient oriented to: self, place, time and situation Is this baseline? Yes   Triage Complete: Triage complete  Chief Complaint DIARRHEA AND VOMITTING  Triage Note Pt reports she was here several weeks ago and diagnosed with gallbladder disease. Pt reports she is not getting any better and now she is having increased swelling.    Allergies Allergies  Allergen Reactions  . Penicillins Rash    Has patient had a PCN reaction causing immediate rash, facial/tongue/throat swelling, SOB or lightheadedness with hypotension: Yes Has patient had a PCN reaction causing severe rash involving mucus membranes or skin necrosis: No Has patient had a PCN reaction that required hospitalization No Has patient had a PCN reaction occurring within the last 10 years: No If all of the above answers are "NO", then may proceed with Cephalosporin use.   REACTION: rash  . Ciprofloxacin     nausea  . Ibuprofen Rash    swelling in leg     Level of Care/Admitting Diagnosis ED Disposition    ED Disposition Condition [facility]: [place] [[id number]]  Level of Care: Telemetry Medical [104]  Covid Evaluation: Asymptomatic Screening Protocol (No Symptoms)  Diagnosis: Cholelithiasis [267124]  Admitting Physician: Norval Morton [[id number]]  Attending Physician: Norval Morton [[id number]]  Estimated length of stay: past midnight tomorrow  Certification:: I certify this patient will need inpatient services for at least 2 midnights  PT Class (Do Not Modify): Inpatient [101]  PT Acc Code (Do Not Modify): Private [1]       B Medical/Surgery History Past Medical History:  Diagnosis Date  . Acute kidney failure with lesion of tubular necrosis (HCC)   . Acute systolic heart  failure ([place])   . CAD (coronary artery disease)    a. s/p prior PCI. b. CABG 2007 at [facility] in [place] 2007. c. inferior STEMI 10/2015 s/p DES to dSVG-PDA.  . Cardiac arrest ([place])   . Cervical cancer ([place])   . Chronic diastolic CHF (congestive heart failure) ([place])   . Chronic respiratory failure ([place])    s/p tracheostomy 2002  . Chronic RUQ pain   . COPD (chronic obstructive pulmonary disease) ([place])   . Diabetes mellitus ([place])   . DVT (deep venous thrombosis) ([place])   . Endometriosis   . History of gallstones 01/2016   seen on Ultrasound  . History of HIDA scan 11/2016   normal  . HTN (hypertension)   . Hyperlipidemia   . Lupus anticoagulant disorder (HCC)    on coumadin  . Morbid obesity ([place])   . ST elevation (STEMI) myocardial infarction involving right coronary artery ([place]) 10/29/15   stent to VG to PDA  . Toe fracture, right 03/29/2018  . Tracheostomy in place [facility]), chronic since 2002 11/03/2015  . Tracheostomy status [facility])    Past Surgical History:  Procedure Laterality Date  . CARDIAC CATHETERIZATION N/A 10/29/2015   Procedure: Left Heart Cath and Cors/Grafts Angiography;  Surgeon: Burnell Blanks, MD;  Location: [place] [facility];  Service: Cardiovascular;  Laterality: N/A;  . CARDIAC CATHETERIZATION  10/29/2015   Procedure: Coronary Stent Intervention;  Surgeon: Burnell Blanks, MD;  Location: [place] [facility];  Service: Cardiovascular;;  . CAROTID STENT    . CESAREAN  SECTION WITH BILATERAL TUBAL LIGATION    . CORONARY ARTERY BYPASS GRAFT  2007   2V  . IR GASTROSTOMY TUBE MOD SED  11/13/2018  . RIGHT/LEFT HEART CATH AND CORONARY/GRAFT ANGIOGRAPHY N/A 03/24/2018   Procedure: RIGHT/LEFT HEART CATH AND CORONARY/GRAFT ANGIOGRAPHY;  Surgeon: Belva Crome, MD;  Location: [place] [facility];  Service: Cardiovascular;  Laterality: N/A;  . TRACHEOSTOMY       A IV Location/Drains/Wounds Patient Lines/Drains/Airways Status   Active  Line/Drains/Airways    Name:   Placement date:   Placement time:   Site:   Days:   Peripheral IV 05/07/19 Right Antecubital   05/07/19    1610    Antecubital   less than 1   CVC Triple Lumen 10/15/18 Right Femoral   10/15/18    1100     204   Gastrostomy/Enterostomy Gastrostomy LUQ   11/13/18    1702    LUQ   175   Incision (Closed) 11/13/18 Abdomen Right   11/13/18    1710     175   Nasoenteric Feeding Tube Cortrak - 43 inches Left nare Documented cm marking at nare/ corner of mouth 65 cm   10/16/18    1426    Left nare   203   Tracheostomy [place] 4 mm Uncuffed   01/13/19    -    4 mm   114          Intake/Output Last 24 hours No intake or output data in the 24 hours ending 05/07/19 1712  Labs/Imaging Results for orders placed or performed during the hospital encounter of 05/07/19 (from the past 48 hour(s))  Lipase, blood     Status: None   Collection Time: 05/07/19 11:29 AM  Result Value Ref Range   Lipase 17 11 - 51 U/L    Comment: Performed at [facility], [address] [place], [place] [address]  Comprehensive metabolic panel     Status: Abnormal   Collection Time: 05/07/19 11:29 AM  Result Value Ref Range   Sodium 137 135 - 145 mmol/L   Potassium 3.2 (L) 3.5 - 5.1 mmol/L   Chloride 92 (L) 98 - 111 mmol/L   CO2 28 22 - 32 mmol/L   Glucose, Bld 302 (H) 70 - 99 mg/dL   BUN 10 6 - 20 mg/dL   Creatinine, Ser 0.75 0.44 - 1.00 mg/dL   Calcium 8.8 (L) 8.9 - 10.3 mg/dL   Total Protein 6.9 6.5 - 8.1 g/dL   Albumin 3.1 (L) 3.5 - 5.0 g/dL   AST 17 15 - 41 U/L   ALT 14 0 - 44 U/L   Alkaline Phosphatase 117 38 - 126 U/L   Total Bilirubin 2.3 (H) 0.3 - 1.2 mg/dL   GFR calc non Af Amer >60 >60 mL/min   GFR calc Af Amer >60 >60 mL/min   Anion gap 17 (H) 5 - 15    Comment: Performed at [facility], [place] [address], [place], [place] [address]  CBC     Status: Abnormal   Collection Time: 05/07/19 11:29 AM  Result Value Ref Range   WBC 12.2 (H) 4.0 - 10.5 K/uL   RBC  4.25 3.87 - 5.11 MIL/uL   Hemoglobin 13.6 12.0 - 15.0 g/dL   HCT 41.6 36.0 - 46.0 %   MCV 97.9 80.0 - 100.0 fL   MCH 32.0 26.0 - 34.0 pg   MCHC 32.7 30.0 - 36.0 g/dL  RDW 18.1 (H) 11.5 - 15.5 %   Platelets 333 150 - 400 K/uL   nRBC 0.0 0.0 - 0.2 %    Comment: Performed at [facility], [place] [address] [place], [place] [address]  Brain natriuretic peptide     Status: Abnormal   Collection Time: 05/07/19 11:29 AM  Result Value Ref Range   B Natriuretic Peptide 2,264.5 (H) 0.0 - 100.0 pg/mL    Comment: Performed at [facility] [address] [place], [place] [address]  I-Stat beta hCG blood, ED     Status: Abnormal   Collection Time: 05/07/19 11:56 AM  Result Value Ref Range   I-stat hCG, quantitative 8.2 (H) <5 mIU/mL   Comment 3            Comment:   GEST. AGE      CONC.  (mIU/mL)   <=1 WEEK        5 - 50     2 WEEKS       50 - 500     3 WEEKS       100 - 10,000     4 WEEKS     1,000 - 30,000        FEMALE AND NON-PREGNANT FEMALE:     LESS THAN 5 mIU/mL   Protime-INR     Status: Abnormal   Collection Time: 05/07/19  3:04 PM  Result Value Ref Range   Prothrombin Time 16.2 (H) 11.4 - 15.2 seconds   INR 1.3 (H) 0.8 - 1.2    Comment: (NOTE) INR goal varies based on device and disease states. Performed at [facility], [place] [address], [place],  [address]    Dg Chest 2 View  Result Date: 05/07/2019 CLINICAL DATA:  Shortness of breath EXAM: CHEST - 2 VIEW COMPARISON:  Jan 12, 2019 FINDINGS: Again noted is a tracheostomy tube is at the level of the clavicular heads. There is increased interstitial markings with pulmonary vascular congestion. Blunting of the bilateral costophrenic angles, likely trace effusions. Again noted is marked cardiomegaly. IMPRESSION: Cardiomegaly with interstitial edema and trace bilateral pleural effusions. Electronically Signed   By: Prudencio Pair M.D.   On: 05/07/2019 13:20   US Abdomen Limited Ruq  Result Date:  05/07/2019 CLINICAL DATA:  Right upper quadrant pain EXAM: ULTRASOUND ABDOMEN LIMITED RIGHT UPPER QUADRANT COMPARISON:  None. FINDINGS: Gallbladder: There is a shadowing gallstone present measuring 1 cm. No wall thickening visualized. No sonographic Murphy sign noted by sonographer. Common bile duct: Diameter: 3 mm Liver: No focal lesion identified. Within normal limits in parenchymal echogenicity. Portal vein is patent on color Doppler imaging with normal direction of blood flow towards the liver. Other: None. IMPRESSION: Cholelithiasis without evidence of acute cholecystitis Electronically Signed   By: Prudencio Pair M.D.   On: 05/07/2019 13:48    Pending Labs Unresulted Labs (From admission, onward)    Start     Ordered   05/08/19 0500  CBC  Tomorrow morning,   R     05/07/19 1650   05/08/19 8341  Basic metabolic panel  Tomorrow morning,   R     05/07/19 1650   05/07/19 1539  SARS Coronavirus 2 [facility] order, Performed in [facility] hospital lab) Nasopharyngeal Nasopharyngeal Swab  (Symptomatic/High Risk of Exposure/Tier 1 Patients Labs with Precautions)  Once,   STAT    Question Answer Comment  Is this test for diagnosis or screening Diagnosis of ill patient  Symptomatic for COVID-19 as defined by CDC Yes   Date of Symptom Onset 05/07/2019   Hospitalized for COVID-19 No   Admitted to ICU for COVID-19 No   Previously tested for COVID-19 No   Resident in a congregate (group) care setting No   Employed in healthcare setting No   Pregnant No      05/07/19 1538   05/07/19 1114  Urinalysis, Routine w reflex microscopic  ONCE - STAT,   STAT     05/07/19 1113          Vitals/Pain Today's Vitals   05/07/19 1426 05/07/19 1430 05/07/19 1500 05/07/19 1615  BP: (!) 154/99 (!) 152/95 (!) 145/95 (!) 140/95  Pulse: (!) 110 (!) 109 (!) 103 (!) 103  Resp: 20 20 (!) 22 (!) 23  Temp:      TempSrc:      SpO2: 90% 91% 90% 91%  Weight:      Height:      PainSc:        Isolation  Precautions No active isolations  Medications Medications  metroNIDAZOLE (FLAGYL) IVPB 500 mg (500 mg Intravenous New Bag/Given 05/07/19 1628)  ceFEPIme (MAXIPIME) 2 g in sodium chloride 0.9 % 100 mL IVPB (0 g Intravenous Stopped 05/07/19 1710)  furosemide (LASIX) injection 80 mg (80 mg Intravenous Given 05/07/19 1708)  potassium chloride SA (K-DUR) CR tablet 10 mEq (has no administration in time range)  sodium chloride flush (NS) 0.9 % injection 3 mL (has no administration in time range)  acetaminophen (TYLENOL) tablet 650 mg (has no administration in time range)    Or  acetaminophen (TYLENOL) suppository 650 mg (has no administration in time range)  ondansetron (ZOFRAN) tablet 4 mg ( Oral See Alternative 05/07/19 1707)    Or  ondansetron (ZOFRAN) injection 4 mg (4 mg Intravenous Given 05/07/19 1707)  albuterol (PROVENTIL) (2.5 MG/3ML) 0.083% nebulizer solution 2.5 mg (has no administration in time range)  fentaNYL (SUBLIMAZE) injection 100 mcg (100 mcg Intravenous Given 05/07/19 1626)    Mobility walks Low fall risk   Focused Assessments       R Recommendations: See Admitting Provider Note  Report given to:   Additional Notes:

## 2019-05-07 NOTE — Progress Notes (Signed)
ANTICOAGULATION CONSULT NOTE - Initial Consult  Pharmacy Consult for heparin Indication: hx of  DVT  Allergies  Allergen Reactions  . Penicillins Rash    Has patient had a PCN reaction causing immediate rash, facial/tongue/throat swelling, SOB or lightheadedness with hypotension: Yes Has patient had a PCN reaction causing severe rash involving mucus membranes or skin necrosis: No Has patient had a PCN reaction that required hospitalization No Has patient had a PCN reaction occurring within the last 10 years: No If all of the above answers are "NO", then may proceed with Cephalosporin use.   REACTION: rash  . Ciprofloxacin     nausea  . Ibuprofen Rash    swelling in leg     Patient Measurements: Height: 4\' 10"  (147.3 cm) Weight: 220 lb (99.8 kg) IBW/kg (Calculated) : 40.9 Heparin Dosing Weight: 65.7 kg  Vital Signs: Temp: 98.9 F (37.2 C) (08/31 1109) Temp Source: Oral (08/31 1109) BP: 140/95 (08/31 1615) Pulse Rate: 103 (08/31 1615)  Labs: Recent Labs    05/07/19 1129 05/07/19 1504  HGB 13.6  --   HCT 41.6  --   PLT 333  --   LABPROT  --  16.2*  INR  --  1.3*  CREATININE 0.75  --     Estimated Creatinine Clearance: 82.8 mL/min (by C-G formula based on SCr of 0.75 mg/dL).   Medical History: Past Medical History:  Diagnosis Date  . Acute kidney failure with lesion of tubular necrosis (HCC)   . Acute systolic heart failure ([place])   . CAD (coronary artery disease)    a. s/p prior PCI. b. CABG 2007 at [facility] in [place] 2007. c. inferior STEMI 10/2015 s/p DES to dSVG-PDA.  . Cardiac arrest ([place])   . Cervical cancer ([place])   . Chronic diastolic CHF (congestive heart failure) ([place])   . Chronic respiratory failure ([place])    s/p tracheostomy 2002  . Chronic RUQ pain   . COPD (chronic obstructive pulmonary disease) ([place])   . Diabetes mellitus ([place])   . DVT (deep venous thrombosis) ([place])   . Endometriosis   . History of gallstones 01/2016   seen on  Ultrasound  . History of HIDA scan 11/2016   normal  . HTN (hypertension)   . Hyperlipidemia   . Lupus anticoagulant disorder (HCC)    on coumadin  . Morbid obesity ([place])   . ST elevation (STEMI) myocardial infarction involving right coronary artery ([place]) 10/29/15   stent to VG to PDA  . Toe fracture, right 03/29/2018  . Tracheostomy in place [facility]), chronic since 2002 11/03/2015  . Tracheostomy status (HCC)     Medications:  Scheduled:  . furosemide  80 mg Intravenous BID  . potassium chloride  10 mEq Oral BID  . sodium chloride flush  3 mL Intravenous Q12H   Infusions:  . ceFEPime (MAXIPIME) IV 2 g (05/07/19 1628)  . metronidazole 500 mg (05/07/19 1628)    Assessment: 81 yoF admitted for acute cholelithiasis with suspected cholecystitis. On warfarin PTA for recent DVT on 7/16, PTA dose 2.5 mg daily. Last dose on 8/29 @1000 , has not taken today or yesterday due to vomiting. INR on admission is subtherapeutic at 1.3. Pharmacy consulted to dose heparin.  CBC wnl, Hgb 13.6, Plt 333. No bleeding noted.   Goal of Therapy:  Heparin level 0.3-0.7 units/ml Monitor platelets by anticoagulation protocol: Yes   Plan:  Heparin bolus 3500 units Heparin infusion at 1300 units/hr Heparin level in 6 hours Monitor  daily heparin levels, CBC, and s/sx of bleeding F/U surgery plans  Berenice Bouton, PharmD PGY1 Pharmacy Resident Office phone: [phone] Phone until 9 pm: [phone] 05/07/2019,4:59 PM

## 2019-05-08 DIAGNOSIS — J9612 Chronic respiratory failure with hypercapnia: Secondary | ICD-10-CM

## 2019-05-08 DIAGNOSIS — J9611 Chronic respiratory failure with hypoxia: Secondary | ICD-10-CM

## 2019-05-08 DIAGNOSIS — I25810 Atherosclerosis of coronary artery bypass graft(s) without angina pectoris: Secondary | ICD-10-CM

## 2019-05-08 DIAGNOSIS — J9621 Acute and chronic respiratory failure with hypoxia: Secondary | ICD-10-CM

## 2019-05-08 DIAGNOSIS — K8000 Calculus of gallbladder with acute cholecystitis without obstruction: Secondary | ICD-10-CM

## 2019-05-08 DIAGNOSIS — K8010 Calculus of gallbladder with chronic cholecystitis without obstruction: Secondary | ICD-10-CM

## 2019-05-08 DIAGNOSIS — I5043 Acute on chronic combined systolic (congestive) and diastolic (congestive) heart failure: Secondary | ICD-10-CM

## 2019-05-08 DIAGNOSIS — D6862 Lupus anticoagulant syndrome: Secondary | ICD-10-CM

## 2019-05-08 LAB — GLUCOSE, CAPILLARY
Glucose-Capillary: 201 mg/dL — ABNORMAL HIGH (ref 70–99)
Glucose-Capillary: 266 mg/dL — ABNORMAL HIGH (ref 70–99)
Glucose-Capillary: 154 mg/dL — ABNORMAL HIGH (ref 70–99)
Glucose-Capillary: 133 mg/dL — ABNORMAL HIGH (ref 70–99)

## 2019-05-08 LAB — CBC
HCT: 36.6 % (ref 36.0–46.0)
Hemoglobin: 12.1 g/dL (ref 12.0–15.0)
MCH: 32.1 pg (ref 26.0–34.0)
MCHC: 33.1 g/dL (ref 30.0–36.0)
MCV: 97.1 fL (ref 80.0–100.0)
Platelets: 279 10*3/uL (ref 150–400)
RBC: 3.77 MIL/uL — ABNORMAL LOW (ref 3.87–5.11)
RDW: 18 % — ABNORMAL HIGH (ref 11.5–15.5)
WBC: 10.4 10*3/uL (ref 4.0–10.5)
nRBC: 0 % (ref 0.0–0.2)

## 2019-05-08 LAB — BASIC METABOLIC PANEL
Anion gap: 13 (ref 5–15)
BUN: 13 mg/dL (ref 6–20)
CO2: 32 mmol/L (ref 22–32)
Calcium: 8.4 mg/dL — ABNORMAL LOW (ref 8.9–10.3)
Chloride: 93 mmol/L — ABNORMAL LOW (ref 98–111)
Creatinine, Ser: 0.78 mg/dL (ref 0.44–1.00)
GFR calc Af Amer: >60 mL/min (ref >60)
GFR calc non Af Amer: >60 mL/min (ref >60)
Glucose, Bld: 249 mg/dL — ABNORMAL HIGH (ref 70–99)
Potassium: 2.7 mmol/L — CL (ref 3.5–5.1)
Sodium: 138 mmol/L (ref 135–145)

## 2019-05-08 LAB — URINALYSIS, ROUTINE W REFLEX MICROSCOPIC
Glucose, UA: 50 mg/dL — AB
Hgb urine dipstick: NEGATIVE
Ketones, ur: 5 mg/dL — AB
Leukocytes,Ua: NEGATIVE
Nitrite: NEGATIVE
Protein, ur: >=300 mg/dL — AB
Specific Gravity, Urine: 1.04 — ABNORMAL HIGH (ref 1.005–1.030)
pH: 5 (ref 5.0–8.0)

## 2019-05-08 LAB — HEPATIC FUNCTION PANEL
ALT: 13 U/L (ref 0–44)
AST: 14 U/L — ABNORMAL LOW (ref 15–41)
Albumin: 2.8 g/dL — ABNORMAL LOW (ref 3.5–5.0)
Alkaline Phosphatase: 100 U/L (ref 38–126)
Bilirubin, Direct: 0.3 mg/dL — ABNORMAL HIGH (ref 0.0–0.2)
Indirect Bilirubin: 1.7 mg/dL — ABNORMAL HIGH (ref 0.3–0.9)
Total Bilirubin: 2 mg/dL — ABNORMAL HIGH (ref 0.3–1.2)
Total Protein: 6.3 g/dL — ABNORMAL LOW (ref 6.5–8.1)

## 2019-05-08 LAB — HEPARIN LEVEL (UNFRACTIONATED): Heparin Unfractionated: 0.4 IU/mL (ref 0.30–0.70)

## 2019-05-08 LAB — MAGNESIUM: Magnesium: 1.5 mg/dL — ABNORMAL LOW (ref 1.7–2.4)

## 2019-05-08 MED ORDER — INSULIN GLARGINE 100 UNIT/ML ~~LOC~~ SOLN
33.0000 [IU] | Freq: Every morning | SUBCUTANEOUS | Status: DC
  Administered 2019-05-09 – 2019-05-17 (×9): 33 [IU] via SUBCUTANEOUS
  Filled 2019-05-08 (×9): qty 0.33

## 2019-05-08 MED ORDER — POTASSIUM CHLORIDE CRYS ER 20 MEQ PO TBCR
80.0000 meq | EXTENDED_RELEASE_TABLET | Freq: Once | ORAL | Status: AC
  Administered 2019-05-08: 80 meq via ORAL
  Filled 2019-05-08: qty 4

## 2019-05-08 MED ORDER — POTASSIUM CHLORIDE 10 MEQ/100ML IV SOLN
10.0000 meq | INTRAVENOUS | Status: AC
  Administered 2019-05-08 (×3): 10 meq via INTRAVENOUS
  Filled 2019-05-08 (×3): qty 100

## 2019-05-08 MED ORDER — POTASSIUM CHLORIDE CRYS ER 20 MEQ PO TBCR
20.0000 meq | EXTENDED_RELEASE_TABLET | Freq: Two times a day (BID) | ORAL | Status: AC
  Administered 2019-05-08 (×2): 20 meq via ORAL
  Filled 2019-05-08 (×2): qty 1

## 2019-05-08 MED ORDER — INSULIN ASPART 100 UNIT/ML ~~LOC~~ SOLN
3.0000 [IU] | Freq: Three times a day (TID) | SUBCUTANEOUS | Status: DC
  Administered 2019-05-09 – 2019-05-12 (×10): 3 [IU] via SUBCUTANEOUS

## 2019-05-08 MED ORDER — POTASSIUM CHLORIDE CRYS ER 20 MEQ PO TBCR: 20.0000 meq | EXTENDED_RELEASE_TABLET | Freq: Two times a day (BID) | ORAL | Status: DC

## 2019-05-08 MED ORDER — SODIUM CHLORIDE 0.9 % IV SOLN
INTRAVENOUS | Status: DC | PRN
  Administered 2019-05-08 (×2): 500 mL via INTRAVENOUS

## 2019-05-08 NOTE — Progress Notes (Signed)
Progress Note  Patient Name: Theresa [name] Date of Encounter: 05/08/2019  Primary Cardiologist: [name], MD   Subjective   Still with some SOB this morning and LE edema. No complaints of chest pain. Denies palpitations  Inpatient Medications    Scheduled Meds: . atorvastatin  20 mg Oral Daily  . famotidine  20 mg Oral Daily  . furosemide  80 mg Intravenous BID  . gabapentin  300 mg Oral BID  . gabapentin  600 mg Oral QHS  . insulin aspart  0-15 Units Subcutaneous TID WC  . insulin glargine  30 Units Subcutaneous q morning - 10a  . losartan  25 mg Oral Daily  . metoprolol succinate  25 mg Oral Daily  . potassium chloride  20 mEq Oral BID  . sertraline  50 mg Oral Daily  . sodium chloride flush  3 mL Intravenous Q12H  . tamsulosin  0.4 mg Oral Daily   Continuous Infusions: . sodium chloride 500 mL (05/08/19 0150)  . ceFEPime (MAXIPIME) IV 2 g (05/08/19 0054)  . heparin 1,300 Units/hr (05/07/19 2139)  . metronidazole 500 mg (05/08/19 0147)   PRN Meds: sodium chloride, acetaminophen **OR** acetaminophen, albuterol, fentaNYL (SUBLIMAZE) injection, HYDROcodone-acetaminophen, LORazepam, ondansetron **OR** ondansetron (ZOFRAN) IV   Vital Signs    Vitals:   05/07/19 2100 05/08/19 0500 05/08/19 0512 05/08/19 0729  BP:   118/84   Pulse: 95  85 90  Resp: 20   15  Temp:   97.8 F (36.6 C)   TempSrc:   Oral   SpO2: 94%  97% 98%  Weight:  90.9 kg    Height:        Intake/Output Summary (Last 24 hours) at 05/08/2019 0828 Last data filed at 05/08/2019 0600 Gross per 24 hour  Intake 1059.68 ml  Output 300 ml  Net 759.68 ml   Filed Weights   05/07/19 1114 05/07/19 2022 05/08/19 0500  Weight: 99.8 kg 90.9 kg 90.9 kg    Telemetry    NSR with occasional PVC - Personally Reviewed  Physical Exam   GEN: Laying in bed in no acute distress.   Neck: No JVD, no carotid bruits Cardiac: RRR, no murmurs, rubs, or gallops.  Respiratory: +Trach. Scattered rhonchi and  crackles at lung bases GI: NABS, Soft, obese, mild TTP in RUQ, non-distended  MS: no pitting edema; No deformity. Neuro:  Nonfocal, moving all extremities spontaneously Psych: Normal affect   Labs    Chemistry Recent Labs  Lab 05/07/19 1129 05/08/19 0352  NA 137 138  K 3.2* 2.7*  CL 92* 93*  CO2 28 32  GLUCOSE 302* 249*  BUN 10 13  CREATININE 0.75 0.78  CALCIUM 8.8* 8.4*  PROT 6.9  --   ALBUMIN 3.1*  --   AST 17  --   ALT 14  --   ALKPHOS 117  --   BILITOT 2.3*  --   GFRNONAA >60 >60  GFRAA >60 >60  ANIONGAP 17* 13     Hematology Recent Labs  Lab 05/07/19 1129 05/08/19 0352  WBC 12.2* 10.4  RBC 4.25 3.77*  HGB 13.6 12.1  HCT 41.6 36.6  MCV 97.9 97.1  MCH 32.0 32.1  MCHC 32.7 33.1  RDW 18.1* 18.0*  PLT 333 279    Cardiac EnzymesNo results for input(s): TROPONINI in the last 168 hours. No results for input(s): TROPIPOC in the last 168 hours.   BNP Recent Labs  Lab 05/07/19 1129  BNP 2,264.5*  DDimer No results for input(s): DDIMER in the last 168 hours.   Radiology    Dg Chest 2 View  Result Date: 05/07/2019 CLINICAL DATA:  Shortness of breath EXAM: CHEST - 2 VIEW COMPARISON:  Jan 12, 2019 FINDINGS: Again noted is a tracheostomy tube is at the level of the clavicular heads. There is increased interstitial markings with pulmonary vascular congestion. Blunting of the bilateral costophrenic angles, likely trace effusions. Again noted is marked cardiomegaly. IMPRESSION: Cardiomegaly with interstitial edema and trace bilateral pleural effusions. Electronically Signed   By: Prudencio Pair M.D.   On: 05/07/2019 13:20   US Abdomen Limited Ruq  Result Date: 05/07/2019 CLINICAL DATA:  Right upper quadrant pain EXAM: ULTRASOUND ABDOMEN LIMITED RIGHT UPPER QUADRANT COMPARISON:  None. FINDINGS: Gallbladder: There is a shadowing gallstone present measuring 1 cm. No wall thickening visualized. No sonographic Murphy sign noted by sonographer. Common bile duct:  Diameter: 3 mm Liver: No focal lesion identified. Within normal limits in parenchymal echogenicity. Portal vein is patent on color Doppler imaging with normal direction of blood flow towards the liver. Other: None. IMPRESSION: Cholelithiasis without evidence of acute cholecystitis Electronically Signed   By: Prudencio Pair M.D.   On: 05/07/2019 13:48    Cardiac Studies   Echocardiogram 01/2019: IMPRESSIONS    1. Severe hypokinesis of the left ventricular, basal-mid inferoseptal wall, inferior wall and inferolateral wall.  2. The left ventricle has mild-moderately reduced systolic function, with an ejection fraction of 40-45%. The cavity size was normal. Left ventricular diastolic Doppler parameters are consistent with pseudonormal. Elevated mean left atrial pressure.  3. The right ventricle has mildly reduced systolic function. The cavity was normal. There is no increase in right ventricular wall thickness. Right ventricular systolic pressure is moderately elevated with an estimated pressure of 58 mmHg.  4. Mitral valve regurgitation is mild to moderate by color flow Doppler. The MR jet is centrally-directed.  5. The inferior vena cava was dilated in size with >50% respiratory variability.  6. Left atrial size was moderately dilated.  7. Right atrial size was mildly dilated.  Right/Left heart cath 03/2018:  Severe native coronary artery disease with total occlusion of the proximal LAD, total occlusion of the proximal RCA, and patent circumflex with patent proximal stent with eccentric 50% proximal narrowing.  Distal obtuse marginal branches are severely and diffusely diseased.  Bypass graft failure with total occlusion of SVG to RCA.  Patent LIMA to LAD.  Right coronary territory is supplied by collaterals from LAD and circumflex.  Left ventricular systolic dysfunction with EF 35 to 45%.  Mid to distal inferior wall akinesis.  Inferobasal and inferoapical wall severely hypokinetic.  Left  ventricular end-diastolic pressure elevated at 19 mmHg  Moderate to severe pulmonary hypertension  RECOMMENDATIONS:   Resume Coumadin therapy overlap with heparin  Continue Plavix  Management of pulmonary status to maintain adequate oxygenation which may help decrease severity of pulmonary hypertension.   Recommend to resume Warfarin, at currently prescribed dose and frequency, on today.  Recommend concurrent antiplatelet therapy of Clopidogrel 75mg  daily for Indefinite.  Patient Profile     53 y.o. female with PMH of CAD (s/p CABG '07 and STEMI PCI '17), chronic respiratory failure 2/2 COPD requiring trach ('02), lupus anticoagulant with DVTs (on warfarin) and HFmrEF (78%) with diastolic dysfunction who presents with abdominal pain, nausea, vomiting, found to have cholelithasis with suspected cholecytisis. Cardiology following for preop assessment and optimization for surgery.   Assessment & Plan    1.  Preoperative assessment: Patient was deemed to be at elevated risk due to cardiac history, poor functional status, and volume overload. Echo 01/2019 with severe hypokinesis of LV, basal-mid inferoseptal wall, inferior wall, and inferolateral wall, with EF 40-45% and mildly reduced RV systolic function. Her last ischemic evaluation was a R/LHC in 2019 with total occlusion of pLAD, pRCA, with patent LCx s/p prox stent (50% narrowing), occlusion of SVG to RCA and patent LIMA with L>R collaterals.  No complaints of recent chest pain.  - She is certainly high risk, though do not see any utility in further cardiac testing at this time.   2. Acute on chronic combined CHF: volume up due to difficulty taking po medications with N/V, she reports missing 2 days of torsemide dosing (80mg  daily). BNP >2000. CXR with interstitial edema and trace bilateral pleural effusions. Started on IV lasix yesterday with UOP net +761mL. Weight stagnant. She reports poor UOP yesterday.  - Continue IV lasix 80mg  BID  - Continue to monitor strict I&Os and daily weights - Continue to monitor electrolytes and replete as needed to maintain K>4, Mg >2 - will add on Mg to morning labs  3. Hypokalemia: K 2.7 this morning. Ordered for 20 mEq po.  - Will replete aggressively with additional po 80 mEq and IV 10 mEq x3.   4. Cholecystitis: patient presented with nausea/vomiting and abdominal pain. Pain somewhat improved this morning. Surgery following with plans CCY vs perc chole drain vs antibiotics alone. Awaitng pulmonary consult for risk assessment.  - Continue management per surgery and primary team  For questions or updates, please contact [facility] Please consult [email or web address] for contact info under Cardiology/STEMI.      Signed, Abigail Butts, PA-C  05/08/2019, 8:28 AM   [phone]

## 2019-05-08 NOTE — Consult Note (Signed)
NAME:  Theresa [name], MRN:  [id number], DOB:  [birth year], LOS: 1 ADMISSION DATE:  05/07/2019, CONSULTATION DATE:  05/08/2019 REFERRING MD:  Tana Coast, CHIEF COMPLAINT:  Clearance for Surgical Intervention [facility] Bladder Removal)   Brief History   53 yo female w/ known CAD (s/p CABG '07 and STEMI PCI '17), chronic respiratory failure 2/2 COPD requiring trach ('02), lupus anticoagulant with DVTs (on warfarin) and HFmrEF (97%) with diastolic dysfunction who presents with abdominal pain, nausea, vomiting, found to have cholelithasis with suspected cholecytisis. She has a chronic trach and has since 2002. PCCM were asked to assess  for risk of surgical intervention vs conservative measures of abx alone vs perc chole drain.     History of present illness   Patient is a 53 year old female who presented to [facility] with RUQ pain, nausea, vomiting and diarrhea since yesterday. Pain is severe, intermittent and radiates to back. Patient has been having similar symptoms for the last several weeks but yesterday this became much more severe and persistent. She has been told she has gallstones in the past, HIDA in 2018 was negative per chart. Patient denied fever, chills, chest pain, urinary symptoms. Reports SOB because her abdomen is swollen and it feels difficult to breath deeply  PMH significant for chronic respiratory failure s/p tracheostomy in 2002, CAD with stents and s/p CABG 2007, Hx of MI, COPD, Chronic diastolic heart failure, Hx of DVT anticoagulated on coumadin, IDDM, HTN, HLD, morbid obesity. Last coumadin 8/29.  Past abdominal surgery includes cesarean section and IR placed G-tube which is no longer present. PCCM have been asked to risk assess for surgical intervention from a pulmonary standpoint.   Past [facility] Events   05/07/2019 Admission  Consults:  PCCM Surgery Cardiology  Procedures:    Significant Diagnostic Tests:  05/07/2019 US Abdomen IMPRESSION:  Cholelithiasis without evidence of acute cholecystitis  01/13/2019>>Echo  Left Ventricle: The left ventricle has mild-moderately reduced systolic function, with an ejection fraction of 40-45%. The cavity size was normal. There is no increase in left ventricular wall thickness. Left ventricular diastolic Doppler parameters are  consistent with pseudonormal (grade II). Elevated mean left atrial pressure. Severe hypokinesis of the left ventricular, basal-mid inferoseptal wall, inferior wall and inferolateral wall  Micro Data:  8/31>> SARS Covid >> Negative 05/07/2019 MRSA PCA>> Negative Antimicrobials:   05/07/2019>> Maxipime 05/07/2019>> Flagyl  Interim history/subjective:  Tired and weak>> right sided radiating Abdominal pain Trach in place with PMV>> 60% FiO2 with sat of 93% States she feels short of breath Weight is up 6 pounds>> + 730 per I&O  Objective   Blood pressure 117/89, pulse 81, temperature 98.2 F (36.8 C), temperature source Oral, resp. rate 20, height 4\' 10"  (1.473 m), weight 90.9 kg, SpO2 93 %.    FiO2 (%):  [60 %] 60 %   Intake/Output Summary (Last 24 hours) at 05/08/2019 1139 Last data filed at 05/08/2019 0600 Gross per 24 hour  Intake 1059.68 ml  Output 300 ml  Net 759.68 ml   Filed Weights   05/07/19 1114 05/07/19 2022 05/08/19 0500  Weight: 99.8 kg 90.9 kg 90.9 kg    Examination: General: Awake and alert,Supine in bed,  Interactive, in NAD HENT: NCAT with exception of chronic #4 cuff less trach, No LAD, No JVD, Thick neck Lungs: Bilateral chest excursion, Trach is secure and intact, MMV, Coarse with bilateral wheezing noted, rhonchi which clear with cough, rales per bases, diminished per bases Cardiovascular:  S1, S2, RRR No RMG Abdomen: Large, soft, obese, mildly Tender RUQ to palpation, BS +, Body mass index is 41.88 kg/m. Extremities: Warm and dry, 1+ edema bilaterally, No obvious defomities Neuro: Awake and Alert and oriented x 3, MAE x 4, conversive,  appropriate GU: Adequate UP, Diuresis in process  Resolved Hospital Problem list     Assessment & Plan:  Chronic Respiratory Failure S/P trach 2002>> well formed stoma Previous CABG 2007 which she tolerated well Acute on chronic diastolic HF Weight up 6 pounds Hx PE on chronic anticoagulation CXR with Cardiomegaly /  interstitial edema / trace bilateral pleural effusions Plan Ok for surgical intervention from Pulmonary perspective Easy transition to vent or cuffed trach as needed in the OR and post op Well developed Stoma Will need optimization of heart failure per  Cards and their  Ok to proceed Diuresis as is ordered  Start IS and Flutter valve now Aggressive pulmonary toilet OOB as tolerated Trend CXR Trend BNP Strict I&O with early post op diuresis as hemodynamics allow Consider Duonebs and Pulmicort nebs for bronchospasm   Major Pulmonary risks identified in the multifactorial risk analysis are but not limited to a) pneumonia; b) prolonged or recurrent acute respiratory failure needing mechanical ventilation; c) prolonged hospitalization; d) DVT/Pulmonary embolism; e) Acute Pulmonary edema  Recommend 1. Short duration of surgery as much as possible and avoid paralytic if possible 2. Recovery in step down or ICU with Pulmonary consultation 3. Resume anticoagulant as soon as possible post op, PAS hose in interrum 4. Aggressive pulmonary toilet with o2, bronchodilatation, and incentive spirometry and early ambulation 5. Resume anticoagulation as soon as possible post op    Best practice:  Diet: Per Primary Team Pain/Anxiety/Delirium protocol (if indicated): Per Primary Team VAP protocol (if indicated): NA DVT prophylaxis: PAS and systemic heparin GI prophylaxis: Pepcid Glucose control: CBG/ SSI Mobility: Per Primary Team Code Status: Full Family Communication: Pt.updated in full at bedside Disposition: Progressive Unit  Labs   CBC: Recent Labs  Lab 05/07/19  1129 05/08/19 0352  WBC 12.2* 10.4  HGB 13.6 12.1  HCT 41.6 36.6  MCV 97.9 97.1  PLT 333 161    Basic Metabolic Panel: Recent Labs  Lab 05/07/19 1129 05/08/19 0352  NA 137 138  K 3.2* 2.7*  CL 92* 93*  CO2 28 32  GLUCOSE 302* 249*  BUN 10 13  CREATININE 0.75 0.78  CALCIUM 8.8* 8.4*  MG  --  1.5*   GFR: Estimated Creatinine Clearance: 78.2 mL/min (by C-G formula based on SCr of 0.78 mg/dL). Recent Labs  Lab 05/07/19 1129 05/08/19 0352  WBC 12.2* 10.4    Liver Function Tests: Recent Labs  Lab 05/07/19 1129 05/08/19 0352  AST 17 14*  ALT 14 13  ALKPHOS 117 100  BILITOT 2.3* 2.0*  PROT 6.9 6.3*  ALBUMIN 3.1* 2.8*   Recent Labs  Lab 05/07/19 1129  LIPASE 17   No results for input(s): AMMONIA in the last 168 hours.  ABG    Component Value Date/Time   PHART 7.365 10/16/2018 1408   PCO2ART 49.7 (H) 10/16/2018 1408   PO2ART 75.0 (L) 10/16/2018 1408   HCO3 28.4 (H) 10/16/2018 1408   TCO2 30 10/16/2018 1408   ACIDBASEDEF 2.0 10/15/2018 1111   O2SAT 94.0 10/16/2018 1408     Coagulation Profile: Recent Labs  Lab 05/07/19 1504  INR 1.3*    Cardiac Enzymes: No results for input(s): CKTOTAL, CKMB, CKMBINDEX, TROPONINI in the last 168 hours.  HbA1C: Hgb A1c MFr Bld  Date/Time Value Ref Range Status  01/13/2019 02:24 AM 6.3 (H) 4.8 - 5.6 % Final    Comment:    (NOTE) Pre diabetes:          5.7%-6.4% Diabetes:              >6.4% Glycemic control for   <7.0% adults with diabetes   10/22/2018 02:10 AM 11.8 (H) 4.8 - 5.6 % Final    Comment:    (NOTE)         Prediabetes: 5.7 - 6.4         Diabetes: >6.4         Glycemic control for adults with diabetes: <7.0     CBG: Recent Labs  Lab 05/07/19 2315 05/08/19 0807  GLUCAP 144* 201*    Review of Systems:   Gen: Denies fever, chills, weight change, fatigue, night sweats HEENT: Denies blurred vision, double vision, hearing loss, tinnitus, sinus congestion, rhinorrhea, sore throat, neck  stiffness, dysphagia PULM: + shortness of breath, No abnormal cough, sputum production, hemoptysis, + wheezing, No trach issues CV: Denies chest pain, + generalized edema, No orthopnea, paroxysmal nocturnal dyspnea, palpitations GI: + Mild right upper quadrent  abdominal pain, + nausea, + vomiting, No diarrhea, hematochezia, melena, constipation, change in bowel habits GU: Denies dysuria, hematuria, polyuria, oliguria, urethral discharge Endocrine: Denies hot or cold intolerance, polyuria, polyphagia or appetite change Derm: Denies rash, dry skin, scaling or peeling skin change Heme: + easy bruising,No  bleeding,No bleeding gums Neuro: Denies headache, numbness, weakness, slurred speech, loss of memory or consciousness  Past Medical History  She,  has a past medical history of Acute kidney failure with lesion of tubular necrosis (HCC), Acute systolic heart failure (HCC), CAD (coronary artery disease), Cardiac arrest ([place]), Cervical cancer (HCC), Chronic diastolic CHF (congestive heart failure) ([place]), Chronic respiratory failure ([place]), Chronic RUQ pain, COPD (chronic obstructive pulmonary disease) ([place]), Diabetes mellitus ([place]), DVT (deep venous thrombosis) ([place]), Endometriosis, History of gallstones (01/2016), History of HIDA scan (11/2016), HTN (hypertension), Hyperlipidemia, Lupus anticoagulant disorder ([place]), Morbid obesity ([place]), ST elevation (STEMI) myocardial infarction involving right coronary artery ([place]) (10/29/15), Toe fracture, right (03/29/2018), Tracheostomy in place [facility]), chronic since 2002 (11/03/2015), and Tracheostomy status ([place]).   Surgical History    Past Surgical History:  Procedure Laterality Date  . CARDIAC CATHETERIZATION N/A 10/29/2015   Procedure: Left Heart Cath and Cors/Grafts Angiography;  Surgeon: Burnell Blanks, MD;  Location: [place] [facility];  Service: Cardiovascular;  Laterality: N/A;  . CARDIAC CATHETERIZATION  10/29/2015   Procedure: Coronary Stent  Intervention;  Surgeon: Burnell Blanks, MD;  Location: [place] [facility];  Service: Cardiovascular;;  . CAROTID STENT    . CESAREAN SECTION WITH BILATERAL TUBAL LIGATION    . CORONARY ARTERY BYPASS GRAFT  2007   2V  . IR GASTROSTOMY TUBE MOD SED  11/13/2018  . RIGHT/LEFT HEART CATH AND CORONARY/GRAFT ANGIOGRAPHY N/A 03/24/2018   Procedure: RIGHT/LEFT HEART CATH AND CORONARY/GRAFT ANGIOGRAPHY;  Surgeon: Belva Crome, MD;  Location: [place] [facility];  Service: Cardiovascular;  Laterality: N/A;  . TRACHEOSTOMY       Social History   reports that she has been smoking cigarettes. She started smoking about 40 years ago. She has been smoking about 0.75 packs per day. She has never used smokeless tobacco. She reports that she does not drink alcohol or use drugs.   Family History   Her family history includes Diabetes in  her father and mother; Hypertension in her father and mother.   Allergies Allergies  Allergen Reactions  . Penicillins Rash    Has patient had a PCN reaction causing immediate rash, facial/tongue/throat swelling, SOB or lightheadedness with hypotension: Yes Has patient had a PCN reaction causing severe rash involving mucus membranes or skin necrosis: No Has patient had a PCN reaction that required hospitalization No Has patient had a PCN reaction occurring within the last 10 years: No If all of the above answers are "NO", then may proceed with Cephalosporin use.   REACTION: rash  . Ciprofloxacin     nausea  . Ibuprofen Rash    swelling in leg      Home Medications  Prior to Admission medications   Medication Sig Start Date End Date Taking? Authorizing Provider  albuterol (PROVENTIL) (2.5 MG/3ML) 0.083% nebulizer solution Take 3 mLs (2.5 mg total) by nebulization 2 (two) times daily. Patient taking differently: Take 2.5 mg by nebulization every 4 (four) hours as needed for wheezing.  10/19/18  Yes Erick Colace, NP  atorvastatin (LIPITOR) 20 MG tablet Take  1 tablet (20 mg total) by mouth daily. 01/19/19 01/19/20 Yes Goodrich, Callie E, PA-C  famotidine (PEPCID) 20 MG tablet Take 20 mg by mouth daily.   Yes [provider]  gabapentin (NEURONTIN) 300 MG capsule Take 1 capsule (300mg ) in the morning, 1 capsule (300mg ) in the afternoon, and 2 capsules (600mg ) at bedtime 01/19/19  Yes Sande Rives E, PA-C  HYDROcodone-acetaminophen (NORCO/VICODIN) 5-325 MG tablet Take 1 tablet by mouth every 4 (four) hours as needed for moderate pain. 03/22/19  Yes Marybelle Killings, MD  Insulin Degludec (TRESIBA FLEXTOUCH [place]) Inject 30 Units into the skin every morning.   Yes [provider]  LORazepam (ATIVAN) 0.5 MG tablet Take 0.5 mg by mouth every 6 (six) hours as needed for anxiety.    Yes [provider]  losartan (COZAAR) 25 MG tablet Take 1 tablet (25 mg total) by mouth daily. 01/20/19  Yes Sande Rives E, PA-C  metoprolol succinate (TOPROL-XL) 25 MG 24 hr tablet Take 25 mg by mouth daily.   Yes [provider]  nitroGLYCERIN (NITROSTAT) 0.4 MG SL tablet Place 0.4 mg under the tongue every 5 (five) minutes as needed for chest pain.   Yes [provider]  OZEMPIC, 0.25 OR 0.5 MG/DOSE, 2 MG/1.5ML SOPN Inject 0.5 mg into the skin once a week. 04/08/19  Yes [provider]  sertraline (ZOLOFT) 50 MG tablet Take 50 mg by mouth daily.   Yes [provider]  tamsulosin (FLOMAX) 0.4 MG CAPS capsule Take 0.4 mg by mouth daily.   Yes [provider]  torsemide (DEMADEX) 20 MG tablet Take 4 tablets (80mg ) daily. Take an additional 4 tablets (80mg  daily) in the evening as needed if you have had > 3lb weight gain overnight. 01/19/19  Yes Sande Rives E, PA-C  warfarin (COUMADIN) 2.5 MG tablet Take 2.5 mg by mouth daily.   Yes [provider]     Critical care APP time: 30 minutes    Magdalen Spatz, AGACNP-BC [place] Pager # [phone] After 4 pm please call  [phone] 05/08/2019 11:39 AM

## 2019-05-08 NOTE — Progress Notes (Signed)
CRITICAL VALUE ALERT  Critical Value:  K 2.7  Date & Time Notied:  05/08/19 0535  Provider Notified: X. Blount, NP  Orders Received/Actions taken: orders received

## 2019-05-08 NOTE — Progress Notes (Addendum)
Patient ID: Theresa [name], female   DOB: 03/01/1966, 53 y.o.   MRN: [id number]       Subjective: Patient still with some pain this morning, but slightly better.  No further n/v.  Objective: Vital signs in last 24 hours: Temp:  [97.8 F (36.6 C)-98.9 F (37.2 C)] 97.8 F (36.6 C) (09/01 0512) Pulse Rate:  [85-110] 90 (09/01 0729) Resp:  [15-30] 15 (09/01 0729) BP: (105-154)/(79-99) 118/84 (09/01 0512) SpO2:  [87 %-98 %] 98 % (09/01 0729) FiO2 (%):  [60 %] 60 % (09/01 0729) Weight:  [90.9 kg-99.8 kg] 90.9 kg (09/01 0500) Last BM Date: 05/07/19  Intake/Output from previous day: 08/31 0701 - 09/01 0700 In: 1059.7 [P.O.:480; I.V.:186.6; IV Piggyback:393] Out: 300 [Urine:300] Intake/Output this shift: No intake/output data recorded.  PE: Heart: regular Lungs: coarse BS bilaterally with rhonchi, trach in place, on TC Abd: soft, obese, mild RUQ tenderness, +BS  Lab Results:  Recent Labs    05/07/19 1129 05/08/19 0352  WBC 12.2* 10.4  HGB 13.6 12.1  HCT 41.6 36.6  PLT 333 279   BMET Recent Labs    05/07/19 1129 05/08/19 0352  NA 137 138  K 3.2* 2.7*  CL 92* 93*  CO2 28 32  GLUCOSE 302* 249*  BUN 10 13  CREATININE 0.75 0.78  CALCIUM 8.8* 8.4*   PT/INR Recent Labs    05/07/19 1504  LABPROT 16.2*  INR 1.3*   CMP     Component Value Date/Time   NA 138 05/08/2019 0352   K 2.7 (LL) 05/08/2019 0352   CL 93 (L) 05/08/2019 0352   CO2 32 05/08/2019 0352   GLUCOSE 249 (H) 05/08/2019 0352   BUN 13 05/08/2019 0352   CREATININE 0.78 05/08/2019 0352   CALCIUM 8.4 (L) 05/08/2019 0352   PROT 6.9 05/07/2019 1129   ALBUMIN 3.1 (L) 05/07/2019 1129   AST 17 05/07/2019 1129   ALT 14 05/07/2019 1129   ALKPHOS 117 05/07/2019 1129   BILITOT 2.3 (H) 05/07/2019 1129   GFRNONAA >60 05/08/2019 0352   GFRAA >60 05/08/2019 0352   Lipase     Component Value Date/Time   LIPASE 17 05/07/2019 1129       Studies/Results: Dg Chest 2 View  Result Date: 05/07/2019  CLINICAL DATA:  Shortness of breath EXAM: CHEST - 2 VIEW COMPARISON:  Jan 12, 2019 FINDINGS: Again noted is a tracheostomy tube is at the level of the clavicular heads. There is increased interstitial markings with pulmonary vascular congestion. Blunting of the bilateral costophrenic angles, likely trace effusions. Again noted is marked cardiomegaly. IMPRESSION: Cardiomegaly with interstitial edema and trace bilateral pleural effusions. Electronically Signed   By: Prudencio Pair M.D.   On: 05/07/2019 13:20   US Abdomen Limited Ruq  Result Date: 05/07/2019 CLINICAL DATA:  Right upper quadrant pain EXAM: ULTRASOUND ABDOMEN LIMITED RIGHT UPPER QUADRANT COMPARISON:  None. FINDINGS: Gallbladder: There is a shadowing gallstone present measuring 1 cm. No wall thickening visualized. No sonographic Murphy sign noted by sonographer. Common bile duct: Diameter: 3 mm Liver: No focal lesion identified. Within normal limits in parenchymal echogenicity. Portal vein is patent on color Doppler imaging with normal direction of blood flow towards the liver. Other: None. IMPRESSION: Cholelithiasis without evidence of acute cholecystitis Electronically Signed   By: Prudencio Pair M.D.   On: 05/07/2019 13:48    Anti-infectives: Anti-infectives (From admission, onward)   Start     Dose/Rate Route Frequency Ordered Stop   05/07/19 1630  ceFEPIme (  MAXIPIME) 2 g in sodium chloride 0.9 % 100 mL IVPB     2 g 200 mL/hr over 30 Minutes Intravenous Every 8 hours 05/07/19 1612     05/07/19 1600  metroNIDAZOLE (FLAGYL) IVPB 500 mg     500 mg 100 mL/hr over 60 Minutes Intravenous Every 8 hours 05/07/19 1548         Assessment/Plan COPD Chronic respiratory failure s/p trach CAD/Hx of MI s/p stents and CABD 2409 Chronic diastolic CHF Hx of DVT  Anticoagulated on coumadin - INR pending, last dose was 8/29 HTN HLD IDDM Morbid Obesity   Cholelithiasis with RUQ pain  - still with some mild RUQ tenderness, but improving -eval  from cardiology noted, patient clearly fairly high risk.  Would likely need a nuc med perfusion study prior to clearance -await pulmonary consult for surgical risk stratification as well -if patient overall, felt to be too high risk, then we can proceed with conservative measures such as abx alone vs perc chole drain. -cont abx for now and wait for further evaluations -TB slightly up yesterday to 2.3.  Will recheck today  FEN: CLD VTE: SCDs, heparin gtt ID: Maxipime/Flagyl  (could decrease maxipime to just rocephin from our standpoint)  WBC normal and AF   LOS: 1 day    [name] , [facility] Surgery 05/08/2019, 8:49 AM Pager: [phone]

## 2019-05-08 NOTE — Progress Notes (Signed)
[place] for heparin Indication: hx of  DVT  Allergies  Allergen Reactions  . Penicillins Rash    Has patient had a PCN reaction causing immediate rash, facial/tongue/throat swelling, SOB or lightheadedness with hypotension: Yes Has patient had a PCN reaction causing severe rash involving mucus membranes or skin necrosis: No Has patient had a PCN reaction that required hospitalization No Has patient had a PCN reaction occurring within the last 10 years: No If all of the above answers are "NO", then may proceed with Cephalosporin use.   REACTION: rash  . Ciprofloxacin     nausea  . Ibuprofen Rash    swelling in leg     Patient Measurements: Height: 4\' 10"  (147.3 cm) Weight: 200 lb 6.4 oz (90.9 kg) IBW/kg (Calculated) : 40.9 Heparin Dosing Weight: 65.7 kg  Vital Signs: Temp: 97.8 F (36.6 C) (09/01 0512) Temp Source: Oral (09/01 0512) BP: 118/84 (09/01 0512) Pulse Rate: 85 (09/01 0512)  Labs: Recent Labs    05/07/19 1129 05/07/19 1504 05/08/19 0352  HGB 13.6  --  12.1  HCT 41.6  --  36.6  PLT 333  --  279  LABPROT  --  16.2*  --   INR  --  1.3*  --   HEPARINUNFRC  --   --  0.40  CREATININE 0.75  --   --     Estimated Creatinine Clearance: 78.2 mL/min (by C-G formula based on SCr of 0.75 mg/dL).  Assessment: 53 y.o. female with h/o DVT, Coumadin on hold and INR subtherapeutic, for heparin  Goal of Therapy:  Heparin level 0.3-0.7 units/ml Monitor platelets by anticoagulation protocol: Yes   Plan:  Continue Heparin at current rate  Phillis Knack, PharmD, BCPS  05/08/2019,5:27 AM

## 2019-05-08 NOTE — Progress Notes (Signed)
[facility]                                                                              Patient Demographics  Krysten Veronica, is a 53 y.o. female, DOB - 03-Nov-1965, GQQ:[id number]  Admit date - 05/07/2019   Admitting Physician Norval Morton, MD  Outpatient Primary MD for the patient is Monico Blitz, MD  Outpatient specialists:   LOS - 1  days   Medical records reviewed and are as summarized below:    Chief Complaint  Patient presents with   Multiple Symptoms       Brief summary   CAMERA KRIENKE is a 53 y.o. female with medical history significant of CAD s/p Cabg, chronic respiratory failure due to COPD trach dependent since 2002, PEA arrest due to a dislodged trach, DVT and lupus anticoagulant on chronic Coumadin, hypertension, and insulin-dependent diabetes mellitus type 2, and morbid obesity; who presents with complaints of abdominal pain, epigastric and right upper quadrant with radiation to her back.  Also associated nausea, vomiting and diarrhea over the last 2 days prior to admission.  Patient also reports increasing shortness of breath, abdominal distention, productive cough, chills and sweats. She was evaluated in ED 2 weeks ago and noted to have cholelithiasis without cholecystitis on CT. In ED, afebrile, tachycardic and tachypneic, O2 sats low as 73%, improved on 8 L of O2 nasal cannula. Chest x-ray concerning for cardiomegaly with interstitial edema and trace bilateral effusions.  Right upper quadrant ultrasound showed cholelithiasis without signs of cholecystitis.  General surgery consulted   Assessment & Plan    Principal Problem:   Cholelithiasis with right upper quadrant abdominal pain, presumed cholecystitis -Right upper quadrant ultrasound with gallstones.  General surgery consulted. -Per general surgery, clinically has acute cholecystitis, very tender in RUQ, started IV antibiotics, needs cardiology and pulmonology clearance before  surgery. -If acutely worsens, would recommend IR consult for perc cholecystostomy.  Continue IV cefepime, Flagyl -Cardiology and pulmonology consulted.  Active Problems: Acute on chronic systolic CHF exacerbation -2D echo noted to be 40 to 45% in May 2020.  Chest x-ray with cardiomegaly, interstitial edema.  BNP 2226. -Appreciate cardiology recommendations, continue IV Lasix, will likely need 1 or 2 days prior to surgery -Recommended continue metoprolol, statin, hold ARB on the day of surgery, continue heparin   Acute on chronic respiratory failure with hypoxia, COPD, trach dependent -Patient hypoxic in ED, required 8 L to maintain O2 saturation, supposed to be on 3 to 4 L.  Possibly due to volume overload -Pulmonology consulted for surgical clearance -Continue nebs, wean O2 as tolerated to home setting  History of DVT, lupus anticoagulant -Patient with history of left lower extremity DVT, lupus anticoagulant, supposed to be on Coumadin.  Reported taking Coumadin except for the last 2 days PTA due to nausea and vomiting. -Heparin per pharmacy, will continue to hold Coumadin for impending surgery  Severe hypokalemia Potassium 2.7, repleted  Insulin-dependent DM, type II -CBGs elevated in 200s, will add meal coverage -Increase Lantus to 33 units daily, continue sliding scale insulin   Anxiety/depression Continue Zoloft  Essential hypertension -Continue losartan, metoprolol  CAD with history of CABG, hyperlipidemia Continue atorvastatin  Morbid obesity BMI 41.9, counseled on lifestyle changes, diet and weight control  Code Status full code DVT Prophylaxis: Heparin IV Family Communication: Discussed all imaging results, lab results, explained to the patient   Disposition Plan: Remains inpatient, pending surgery   Time Spent in minutes 35 minutes  Procedures:  None  Consultants:   General surgery Cardiology Pulmonology-called today  Antimicrobials:    Anti-infectives (From admission, onward)   Start     Dose/Rate Route Frequency Ordered Stop   05/07/19 1630  ceFEPIme (MAXIPIME) 2 g in sodium chloride 0.9 % 100 mL IVPB     2 g 200 mL/hr over 30 Minutes Intravenous Every 8 hours 05/07/19 1612     05/07/19 1600  metroNIDAZOLE (FLAGYL) IVPB 500 mg     500 mg 100 mL/hr over 60 Minutes Intravenous Every 8 hours 05/07/19 1548            Medications  Scheduled Meds:  atorvastatin  20 mg Oral Daily   famotidine  20 mg Oral Daily   furosemide  80 mg Intravenous BID   gabapentin  300 mg Oral BID   gabapentin  600 mg Oral QHS   insulin aspart  0-15 Units Subcutaneous TID WC   insulin glargine  30 Units Subcutaneous q morning - 10a   losartan  25 mg Oral Daily   metoprolol succinate  25 mg Oral Daily   potassium chloride  20 mEq Oral BID   sertraline  50 mg Oral Daily   sodium chloride flush  3 mL Intravenous Q12H   tamsulosin  0.4 mg Oral Daily   Continuous Infusions:  sodium chloride 500 mL (05/08/19 0150)   ceFEPime (MAXIPIME) IV 2 g (05/08/19 0907)   heparin 1,300 Units/hr (05/08/19 1136)   metronidazole 500 mg (05/08/19 1014)   potassium chloride 10 mEq (05/08/19 1140)   PRN Meds:.sodium chloride, acetaminophen **OR** acetaminophen, albuterol, fentaNYL (SUBLIMAZE) injection, HYDROcodone-acetaminophen, LORazepam, ondansetron **OR** ondansetron (ZOFRAN) IV      Subjective:   Zannah Melucci was seen and examined today.  Still with pain in the right upper quadrant and epigastric region 5/10, mildly better with pain medications.  Overnight no fevers or chills, no vomiting. No acute events overnight.    Objective:   Vitals:   05/08/19 0500 05/08/19 0512 05/08/19 0729 05/08/19 1122  BP:  118/84  117/89  Pulse:  85 90 81  Resp:   15 20  Temp:  97.8 F (36.6 C)  98.2 F (36.8 C)  TempSrc:  Oral  Oral  SpO2:  97% 98% 93%  Weight: 90.9 kg     Height:        Intake/Output Summary (Last 24 hours) at  05/08/2019 1146 Last data filed at 05/08/2019 0600 Gross per 24 hour  Intake 1059.68 ml  Output 300 ml  Net 759.68 ml     Wt Readings from Last 3 Encounters:  05/08/19 90.9 kg  04/23/19 104.3 kg  03/27/19 88.3 kg     Exam  General: Alert and oriented x 3, NAD  Eyes:   HEENT: Trach  Cardiovascular: S1 S2 auscultated, no murmurs, RRR  Respiratory: Bilateral scattered rhonchi  Gastrointestinal obese, mild RUQ tenderness, NBS  Ext: no pedal edema bilaterally  Neuro: No new deficit  Musculoskeletal: No digital cyanosis, clubbing  Skin: No rashes  Psych: Normal affect and demeanor, alert and oriented x3    Data Reviewed:  I have personally reviewed following labs and  imaging studies  Micro Results Recent Results (from the past 240 hour(s))  SARS Coronavirus 2 [facility] order, Performed in [facility] hospital lab) Nasopharyngeal Nasopharyngeal Swab     Status: None   Collection Time: 05/07/19  3:59 PM   Specimen: Nasopharyngeal Swab  Result Value Ref Range Status   SARS Coronavirus 2 NEGATIVE NEGATIVE Final    Comment: (NOTE) If result is NEGATIVE SARS-CoV-2 target nucleic acids are NOT DETECTED. The SARS-CoV-2 RNA is generally detectable in upper and lower  respiratory specimens during the acute phase of infection. The lowest  concentration of SARS-CoV-2 viral copies this assay can detect is 250  copies / mL. A negative result does not preclude SARS-CoV-2 infection  and should not be used as the sole basis for treatment or other  patient management decisions.  A negative result may occur with  improper specimen collection / handling, submission of specimen other  than nasopharyngeal swab, presence of viral mutation(s) within the  areas targeted by this assay, and inadequate number of viral copies  (<250 copies / mL). A negative result must be combined with clinical  observations, patient history, and epidemiological information. If result is POSITIVE SARS-CoV-2  target nucleic acids are DETECTED. The SARS-CoV-2 RNA is generally detectable in upper and lower  respiratory specimens dur ing the acute phase of infection.  Positive  results are indicative of active infection with SARS-CoV-2.  Clinical  correlation with patient history and other diagnostic information is  necessary to determine patient infection status.  Positive results do  not rule out bacterial infection or co-infection with other viruses. If result is PRESUMPTIVE POSTIVE SARS-CoV-2 nucleic acids MAY BE PRESENT.   A presumptive positive result was obtained on the submitted specimen  and confirmed on repeat testing.  While 2019 novel coronavirus  (SARS-CoV-2) nucleic acids may be present in the submitted sample  additional confirmatory testing may be necessary for epidemiological  and / or clinical management purposes  to differentiate between  SARS-CoV-2 and other Sarbecovirus currently known to infect humans.  If clinically indicated additional testing with an alternate test  methodology [phone]) is advised. The SARS-CoV-2 RNA is generally  detectable in upper and lower respiratory sp ecimens during the acute  phase of infection. The expected result is Negative. Fact Sheet for Patients:  StrictlyIdeas.no Fact Sheet for Healthcare Providers: BankingDealers.co.za This test is not yet approved or cleared by the [place] FDA and has been authorized for detection and/or diagnosis of SARS-CoV-2 by FDA under an Emergency Use Authorization (EUA).  This EUA will remain in effect (meaning this test can be used) for the duration of the COVID-19 declaration under Section 564(b)(1) of the Act, 21 U.S.C. section 360bbb-3(b)(1), unless the authorization is terminated or revoked sooner. Performed at [facility], [place] [address], [place], [place] [address]     Radiology Reports Dg Chest 2 View  Result Date: 05/07/2019 CLINICAL  DATA:  Shortness of breath EXAM: CHEST - 2 VIEW COMPARISON:  Jan 12, 2019 FINDINGS: Again noted is a tracheostomy tube is at the level of the clavicular heads. There is increased interstitial markings with pulmonary vascular congestion. Blunting of the bilateral costophrenic angles, likely trace effusions. Again noted is marked cardiomegaly. IMPRESSION: Cardiomegaly with interstitial edema and trace bilateral pleural effusions. Electronically Signed   By: Prudencio Pair M.D.   On: 05/07/2019 13:20   Ct Renal Stone Study  Result Date: 04/23/2019 CLINICAL DATA:  53 year old female with acute RIGHT flank, abdominal and back pain  for 1 week. EXAM: CT ABDOMEN AND PELVIS WITHOUT CONTRAST TECHNIQUE: Multidetector CT imaging of the abdomen and pelvis was performed following the standard protocol without IV contrast. COMPARISON:  11/21/2018 and prior CTs FINDINGS: Please note that parenchymal and vascular abnormalities may be missed without intravenous contrast. Lower chest: Bibasilar scarring/atelectasis and ground-glass opacities are unchanged. Cardiomegaly again noted. Hepatobiliary: The liver is unremarkable. Cholelithiasis noted without CT evidence of acute cholecystitis. No biliary dilatation. Pancreas: Unremarkable Spleen: Unremarkable Adrenals/Urinary Tract: The kidneys, adrenal glands and bladder are unremarkable except for stable LEFT adrenal adenoma. There is no evidence of hydronephrosis or urinary calculi. Stomach/Bowel: Stomach is within normal limits. Appendix appears normal. No evidence of bowel wall thickening, distention, or inflammatory changes. Vascular/Lymphatic: Aortic atherosclerosis. No enlarged abdominal or pelvic lymph nodes. Reproductive: Uterus and bilateral adnexa are unremarkable. Other: No ascites,, focal collection or pneumoperitoneum. Musculoskeletal: No acute or suspicious bony abnormalities. IMPRESSION: 1. No acute abnormality. No CT findings to suggest a cause for this patient's RIGHT  abdominal pain. 2. Cholelithiasis without CT evidence of acute cholecystitis. 3. Cardiomegaly and chronic bibasilar pulmonary scarring/atelectasis/ground-glass opacities. 4.  Aortic Atherosclerosis (ICD10-I70.0). Electronically Signed   By: Margarette Canada M.D.   On: 04/23/2019 18:19   Xr Humerus Left  Result Date: 04/19/2019 2 view x-rays left humerus demonstrates refracture through the old fracture site without evidence of callus formation in comparison to 03/22/2019 images. Impression: persistent fracture proximal humerus shaft without evidence of radiographic union.  US Abdomen Limited Ruq  Result Date: 05/07/2019 CLINICAL DATA:  Right upper quadrant pain EXAM: ULTRASOUND ABDOMEN LIMITED RIGHT UPPER QUADRANT COMPARISON:  None. FINDINGS: Gallbladder: There is a shadowing gallstone present measuring 1 cm. No wall thickening visualized. No sonographic Murphy sign noted by sonographer. Common bile duct: Diameter: 3 mm Liver: No focal lesion identified. Within normal limits in parenchymal echogenicity. Portal vein is patent on color Doppler imaging with normal direction of blood flow towards the liver. Other: None. IMPRESSION: Cholelithiasis without evidence of acute cholecystitis Electronically Signed   By: Prudencio Pair M.D.   On: 05/07/2019 13:48    Lab Data:  CBC: Recent Labs  Lab 05/07/19 1129 05/08/19 0352  WBC 12.2* 10.4  HGB 13.6 12.1  HCT 41.6 36.6  MCV 97.9 97.1  PLT 333 973   Basic Metabolic Panel: Recent Labs  Lab 05/07/19 1129 05/08/19 0352  NA 137 138  K 3.2* 2.7*  CL 92* 93*  CO2 28 32  GLUCOSE 302* 249*  BUN 10 13  CREATININE 0.75 0.78  CALCIUM 8.8* 8.4*  MG  --  1.5*   GFR: Estimated Creatinine Clearance: 78.2 mL/min (by C-G formula based on SCr of 0.78 mg/dL). Liver Function Tests: Recent Labs  Lab 05/07/19 1129 05/08/19 0352  AST 17 14*  ALT 14 13  ALKPHOS 117 100  BILITOT 2.3* 2.0*  PROT 6.9 6.3*  ALBUMIN 3.1* 2.8*   Recent Labs  Lab 05/07/19 1129   LIPASE 17   No results for input(s): AMMONIA in the last 168 hours. Coagulation Profile: Recent Labs  Lab 05/07/19 1504  INR 1.3*   Cardiac Enzymes: No results for input(s): CKTOTAL, CKMB, CKMBINDEX, TROPONINI in the last 168 hours. BNP (last 3 results) No results for input(s): PROBNP in the last 8760 hours. HbA1C: No results for input(s): HGBA1C in the last 72 hours. CBG: Recent Labs  Lab 05/07/19 2315 05/08/19 0807  GLUCAP 144* 201*   Lipid Profile: No results for input(s): CHOL, HDL, LDLCALC, TRIG, CHOLHDL, LDLDIRECT in the  last 72 hours. Thyroid Function Tests: No results for input(s): TSH, T4TOTAL, FREET4, T3FREE, THYROIDAB in the last 72 hours. Anemia Panel: No results for input(s): VITAMINB12, FOLATE, FERRITIN, TIBC, IRON, RETICCTPCT in the last 72 hours. Urine analysis:    Component Value Date/Time   COLORURINE AMBER (A) 05/08/2019 0405   APPEARANCEUR HAZY (A) 05/08/2019 0405   LABSPEC 1.040 (H) 05/08/2019 0405   PHURINE 5.0 05/08/2019 0405   GLUCOSEU 50 (A) 05/08/2019 0405   HGBUR NEGATIVE 05/08/2019 0405   BILIRUBINUR SMALL (A) 05/08/2019 0405   KETONESUR 5 (A) 05/08/2019 0405   PROTEINUR >=300 (A) 05/08/2019 0405   NITRITE NEGATIVE 05/08/2019 0405   LEUKOCYTESUR NEGATIVE 05/08/2019 0405     Sherel Fennell M.D. [facility] 05/08/2019, 11:46 AM  Pager: [phone] Between 7am to 7pm - call Pager - [phone]  After 7pm go to [email or web address] - password [email or web address]  Call night coverage person covering after 7pm

## 2019-05-09 LAB — GLUCOSE, CAPILLARY
Glucose-Capillary: 163 mg/dL — ABNORMAL HIGH (ref 70–99)
Glucose-Capillary: 138 mg/dL — ABNORMAL HIGH (ref 70–99)
Glucose-Capillary: 158 mg/dL — ABNORMAL HIGH (ref 70–99)
Glucose-Capillary: 141 mg/dL — ABNORMAL HIGH (ref 70–99)
Glucose-Capillary: 142 mg/dL — ABNORMAL HIGH (ref 70–99)

## 2019-05-09 LAB — BASIC METABOLIC PANEL
Anion gap: 14 (ref 5–15)
BUN: 17 mg/dL (ref 6–20)
CO2: 26 mmol/L (ref 22–32)
Calcium: 8.3 mg/dL — ABNORMAL LOW (ref 8.9–10.3)
Chloride: 98 mmol/L (ref 98–111)
Creatinine, Ser: 0.84 mg/dL (ref 0.44–1.00)
GFR calc Af Amer: >60 mL/min (ref >60)
GFR calc non Af Amer: >60 mL/min (ref >60)
Glucose, Bld: 154 mg/dL — ABNORMAL HIGH (ref 70–99)
Potassium: 4.2 mmol/L (ref 3.5–5.1)
Sodium: 138 mmol/L (ref 135–145)

## 2019-05-09 LAB — CBC
HCT: 38 % (ref 36.0–46.0)
Hemoglobin: 12 g/dL (ref 12.0–15.0)
MCH: 31.3 pg (ref 26.0–34.0)
MCHC: 31.6 g/dL (ref 30.0–36.0)
MCV: 99.2 fL (ref 80.0–100.0)
Platelets: 269 10*3/uL (ref 150–400)
RBC: 3.83 MIL/uL — ABNORMAL LOW (ref 3.87–5.11)
RDW: 18.3 % — ABNORMAL HIGH (ref 11.5–15.5)
WBC: 8.3 10*3/uL (ref 4.0–10.5)
nRBC: 0.2 % (ref 0.0–0.2)

## 2019-05-09 LAB — MAGNESIUM: Magnesium: 1.7 mg/dL (ref 1.7–2.4)

## 2019-05-09 LAB — HEPARIN LEVEL (UNFRACTIONATED): Heparin Unfractionated: 0.6 IU/mL (ref 0.30–0.70)

## 2019-05-09 MED ORDER — MAGNESIUM SULFATE 4 GM/100ML IV SOLN
4.0000 g | Freq: Once | INTRAVENOUS | Status: AC
  Administered 2019-05-09: 4 g via INTRAVENOUS
  Filled 2019-05-09: qty 100

## 2019-05-09 MED ORDER — METOLAZONE 5 MG PO TABS
5.0000 mg | ORAL_TABLET | Freq: Once | ORAL | Status: AC
  Administered 2019-05-09: 18:00:00 5 mg via ORAL
  Filled 2019-05-09: qty 1

## 2019-05-09 NOTE — Progress Notes (Signed)
PROGRESS NOTE    Theresa [name]  YBO:[id number] DOB: 1965/11/17 DOA: 05/07/2019 PCP: Monico Blitz, MD   Brief Narrative:  HPI On 05/07/2019 by Dr. Fuller Plan Theresa [name] is a 53 y.o. female with medical history significant of CAD s/p Cabg, chronic respiratory failure due to COPD trach dependent since 2002, PEA arrest due to a dislodged trach, DVT and lupus anticoagulant on chronic Coumadin, hypertension, and insulin-dependent diabetes mellitus type 2, and morbid obesity; who presents with complaints of abdominal pain.  Pain is located in the epigastrically and in the right quadrant with radiation to her back.  Associated symptoms included nausea, vomiting, and diarrhea over the last 2 days.  Endorses increasing shortness of breath, abdominal distention, productive cough, chills, and sweats.  She was evaluated in the emergency department 2 weeks ago and noted to have cholelithiasis without cholecystitis on CT scan.  Interim history Patient admitted with right upper quadrant abdominal pain found to have cholelithiasis with presumed cholecystitis.  General surgery consulted and appreciated.  Patient also noted to have acute CHF exacerbation as well as a respiratory failure and hypoxia.  Cardiology and pulmonology consulted and following.  Patient currently diuresing. Assessment & Plan   Cholelithiasis with right upper quadrant abdominal pain, presumed cholecystitis -Right upper quadrant ultrasound showed gallstones -General surgery consulted and appreciated feels that patient clinically has acute cholecystitis.  Patient was started on IV cefepime and Flagyl -Cardiology and pulmonology consulted for preoperative assessment  Acute on chronic systolic CHF exacerbation -Echocardiogram May 2020 showed an EF of 40 to 45% -Chest x-ray with cardiomegaly, interstitial edema -BNP 2226 -Cardiology consulted and appreciated -Patient currently on IV Lasix -Monitor intake and output, daily weights  -Continue metoprolol, statin, losartan -Currently on heparin  Acute on chronic respiratory failure with hypoxia/COPD/trach dependent -In the emergency department, patient was noted to be hypoxic and needing 8 L of oxygen.  Baseline 3 to 4 L -Suspect secondary to volume overload -As above, pulmonology consulted and appreciated -Continue pulmonary toilet, nebulizer treatments as needed  History of DVT/lupus anticoagulant -Patient with known history of left lower extremity DVT as well as lupus anticoagulant. -Has been taking Coumadin prior to admission-however stopped 2 days due to nausea and vomiting -Currently on heparin  Severe hypokalemia/hypomagnesemia -Resolved with replacement -continue to monitor and replace as needed  Diabetes mellitus, type II -Continue Lantus, insulin sliding scale and CBG monitoring  Anxiety/depression -Continue Zoloft  Essential hypertension -BP stable, continue metoprolol, losartan  CAD/history of CABG, hyperlipidemia -Continue metoprolol, losartan, statin  Morbid obesity -BMI 41.88 -Patient will need to follow up with her PCP to discuss lifestyle modifications  DVT Prophylaxis  Heparin  Code Status: Full  Family Communication: None at bedside  Disposition Plan: Admitted. Pending improvement in fluid status and further surgical recommendations  Consultants General surgery Cardiology Pulmonology   Procedures  None  Antibiotics   Anti-infectives (From admission, onward)   Start     Dose/Rate Route Frequency Ordered Stop   05/07/19 1630  ceFEPIme (MAXIPIME) 2 g in sodium chloride 0.9 % 100 mL IVPB     2 g 200 mL/hr over 30 Minutes Intravenous Every 8 hours 05/07/19 1612     05/07/19 1600  metroNIDAZOLE (FLAGYL) IVPB 500 mg     500 mg 100 mL/hr over 60 Minutes Intravenous Every 8 hours 05/07/19 1548        Subjective:   Theresa [name] seen and examined today.  Patient states she has abdominal pain today. She feels her swelling  has gone down and does not feel "as tight" as previous days. Denies current chest pain, shortness of breath, nausea, vomiting, dizziness, headache.    Objective:   Vitals:   05/09/19 0445 05/09/19 0516 05/09/19 0818 05/09/19 0858  BP:  101/74 104/71 104/71  Pulse: 75 79 74 76  Resp: 18  14 (!) 25  Temp:  97.6 F (36.4 C) (!) 97.4 F (36.3 C)   TempSrc:  Oral Oral   SpO2: 98% 99% 98% 96%  Weight:      Height:        Intake/Output Summary (Last 24 hours) at 05/09/2019 0910 Last data filed at 05/09/2019 0100 Gross per 24 hour  Intake 1401.28 ml  Output 200 ml  Net 1201.28 ml   Filed Weights   05/07/19 1114 05/07/19 2022 05/08/19 0500  Weight: 99.8 kg 90.9 kg 90.9 kg    Exam  General: Well developed, chronically ill appearing, NAD  HEENT: NCAT, mucous membranes moist.  Neck: +trach  Cardiovascular: S1 S2 auscultated, RRR, no murmur  Respiratory: Diminished breath sounds anteriorly   Abdomen: Soft, obese, RUQ TTP, nondistended, + bowel sounds  Extremities: warm dry without cyanosis clubbing or edema  Neuro: AAOx3, nonfocal  Psych: Appropriate mood and affect   Data Reviewed: I have personally reviewed following labs and imaging studies  CBC: Recent Labs  Lab 05/07/19 1129 05/08/19 0352 05/09/19 0414  WBC 12.2* 10.4 8.3  HGB 13.6 12.1 12.0  HCT 41.6 36.6 38.0  MCV 97.9 97.1 99.2  PLT 333 279 962   Basic Metabolic Panel: Recent Labs  Lab 05/07/19 1129 05/08/19 0352 05/09/19 0713  NA 137 138 138  K 3.2* 2.7* 4.2  CL 92* 93* 98  CO2 28 32 26  GLUCOSE 302* 249* 154*  BUN 10 13 17   CREATININE 0.75 0.78 0.84  CALCIUM 8.8* 8.4* 8.3*  MG  --  1.5* 1.7   GFR: Estimated Creatinine Clearance: 74.5 mL/min (by C-G formula based on SCr of 0.84 mg/dL). Liver Function Tests: Recent Labs  Lab 05/07/19 1129 05/08/19 0352  AST 17 14*  ALT 14 13  ALKPHOS 117 100  BILITOT 2.3* 2.0*  PROT 6.9 6.3*  ALBUMIN 3.1* 2.8*   Recent Labs  Lab 05/07/19 1129   LIPASE 17   No results for input(s): AMMONIA in the last 168 hours. Coagulation Profile: Recent Labs  Lab 05/07/19 1504  INR 1.3*   Cardiac Enzymes: No results for input(s): CKTOTAL, CKMB, CKMBINDEX, TROPONINI in the last 168 hours. BNP (last 3 results) No results for input(s): PROBNP in the last 8760 hours. HbA1C: No results for input(s): HGBA1C in the last 72 hours. CBG: Recent Labs  Lab 05/08/19 1242 05/08/19 1655 05/08/19 2318 05/09/19 0524 05/09/19 0817  GLUCAP 266* 154* 133* 163* 138*   Lipid Profile: No results for input(s): CHOL, HDL, LDLCALC, TRIG, CHOLHDL, LDLDIRECT in the last 72 hours. Thyroid Function Tests: No results for input(s): TSH, T4TOTAL, FREET4, T3FREE, THYROIDAB in the last 72 hours. Anemia Panel: No results for input(s): VITAMINB12, FOLATE, FERRITIN, TIBC, IRON, RETICCTPCT in the last 72 hours. Urine analysis:    Component Value Date/Time   COLORURINE AMBER (A) 05/08/2019 0405   APPEARANCEUR HAZY (A) 05/08/2019 0405   LABSPEC 1.040 (H) 05/08/2019 0405   PHURINE 5.0 05/08/2019 0405   GLUCOSEU 50 (A) 05/08/2019 0405   HGBUR NEGATIVE 05/08/2019 0405   BILIRUBINUR SMALL (A) 05/08/2019 0405   KETONESUR 5 (A) 05/08/2019 0405   PROTEINUR >=300 (A) 05/08/2019 0405  NITRITE NEGATIVE 05/08/2019 0405   LEUKOCYTESUR NEGATIVE 05/08/2019 0405   Sepsis Labs: @LABRCNTIP (procalcitonin:4,lacticidven:4)  ) Recent Results (from the past 240 hour(s))  SARS Coronavirus 2 [facility] order, Performed in [facility] hospital lab) Nasopharyngeal Nasopharyngeal Swab     Status: None   Collection Time: 05/07/19  3:59 PM   Specimen: Nasopharyngeal Swab  Result Value Ref Range Status   SARS Coronavirus 2 NEGATIVE NEGATIVE Final    Comment: (NOTE) If result is NEGATIVE SARS-CoV-2 target nucleic acids are NOT DETECTED. The SARS-CoV-2 RNA is generally detectable in upper and lower  respiratory specimens during the acute phase of infection. The lowest   concentration of SARS-CoV-2 viral copies this assay can detect is 250  copies / mL. A negative result does not preclude SARS-CoV-2 infection  and should not be used as the sole basis for treatment or other  patient management decisions.  A negative result may occur with  improper specimen collection / handling, submission of specimen other  than nasopharyngeal swab, presence of viral mutation(s) within the  areas targeted by this assay, and inadequate number of viral copies  (<250 copies / mL). A negative result must be combined with clinical  observations, patient history, and epidemiological information. If result is POSITIVE SARS-CoV-2 target nucleic acids are DETECTED. The SARS-CoV-2 RNA is generally detectable in upper and lower  respiratory specimens dur ing the acute phase of infection.  Positive  results are indicative of active infection with SARS-CoV-2.  Clinical  correlation with patient history and other diagnostic information is  necessary to determine patient infection status.  Positive results do  not rule out bacterial infection or co-infection with other viruses. If result is PRESUMPTIVE POSTIVE SARS-CoV-2 nucleic acids MAY BE PRESENT.   A presumptive positive result was obtained on the submitted specimen  and confirmed on repeat testing.  While 2019 novel coronavirus  (SARS-CoV-2) nucleic acids may be present in the submitted sample  additional confirmatory testing may be necessary for epidemiological  and / or clinical management purposes  to differentiate between  SARS-CoV-2 and other Sarbecovirus currently known to infect humans.  If clinically indicated additional testing with an alternate test  methodology [phone]) is advised. The SARS-CoV-2 RNA is generally  detectable in upper and lower respiratory sp ecimens during the acute  phase of infection. The expected result is Negative. Fact Sheet for Patients:  StrictlyIdeas.no Fact Sheet  for Healthcare Providers: BankingDealers.co.za This test is not yet approved or cleared by the [place] FDA and has been authorized for detection and/or diagnosis of SARS-CoV-2 by FDA under an Emergency Use Authorization (EUA).  This EUA will remain in effect (meaning this test can be used) for the duration of the COVID-19 declaration under Section 564(b)(1) of the Act, 21 U.S.C. section 360bbb-3(b)(1), unless the authorization is terminated or revoked sooner. Performed at [facility], [place] [address], [place], [place] [address]       Radiology Studies: Dg Chest 2 View  Result Date: 05/07/2019 CLINICAL DATA:  Shortness of breath EXAM: CHEST - 2 VIEW COMPARISON:  Jan 12, 2019 FINDINGS: Again noted is a tracheostomy tube is at the level of the clavicular heads. There is increased interstitial markings with pulmonary vascular congestion. Blunting of the bilateral costophrenic angles, likely trace effusions. Again noted is marked cardiomegaly. IMPRESSION: Cardiomegaly with interstitial edema and trace bilateral pleural effusions. Electronically Signed   By: Prudencio Pair M.D.   On: 05/07/2019 13:20   US Abdomen Limited Ruq  Result Date:  05/07/2019 CLINICAL DATA:  Right upper quadrant pain EXAM: ULTRASOUND ABDOMEN LIMITED RIGHT UPPER QUADRANT COMPARISON:  None. FINDINGS: Gallbladder: There is a shadowing gallstone present measuring 1 cm. No wall thickening visualized. No sonographic Murphy sign noted by sonographer. Common bile duct: Diameter: 3 mm Liver: No focal lesion identified. Within normal limits in parenchymal echogenicity. Portal vein is patent on color Doppler imaging with normal direction of blood flow towards the liver. Other: None. IMPRESSION: Cholelithiasis without evidence of acute cholecystitis Electronically Signed   By: Prudencio Pair M.D.   On: 05/07/2019 13:48     Scheduled Meds: . atorvastatin  20 mg Oral Daily  . famotidine  20 mg Oral Daily   . furosemide  80 mg Intravenous BID  . gabapentin  300 mg Oral BID  . gabapentin  600 mg Oral QHS  . insulin aspart  0-15 Units Subcutaneous TID WC  . insulin aspart  3 Units Subcutaneous TID WC  . insulin glargine  33 Units Subcutaneous q morning - 10a  . losartan  25 mg Oral Daily  . metoprolol succinate  25 mg Oral Daily  . sertraline  50 mg Oral Daily  . sodium chloride flush  3 mL Intravenous Q12H  . tamsulosin  0.4 mg Oral Daily   Continuous Infusions: . sodium chloride 500 mL (05/08/19 0150)  . ceFEPime (MAXIPIME) IV 200 mL/hr at 05/09/19 0100  . heparin 1,300 Units/hr (05/09/19 0843)  . magnesium sulfate bolus IVPB 4 g (05/09/19 0851)  . metronidazole 500 mg (05/09/19 0050)     LOS: 2 days   Time Spent in minutes   45 minutes  Trenae Brunke D.O. on 05/09/2019 at 9:10 AM  Between 7am to 7pm - Please see pager noted on [email or web address]  After 7pm go to [email or web address]  And look for the night coverage person covering for me after hours  [facility] Office  [phone]

## 2019-05-09 NOTE — Progress Notes (Signed)
Patient ID: Theresa [name], female   DOB: Jul 11, 1966, 53 y.o.   MRN: [id number]       Subjective: Patient states she continues to feel somewhat better.  Tolerating clear liquids, but still having some upper abdominal pain.  Objective: Vital signs in last 24 hours: Temp:  [97.4 F (36.3 C)-99.3 F (37.4 C)] 97.4 F (36.3 C) (09/02 0818) Pulse Rate:  [74-85] 76 (09/02 0858) Resp:  [13-27] 25 (09/02 0858) BP: (93-117)/(58-89) 104/71 (09/02 0858) SpO2:  [92 %-99 %] 96 % (09/02 0858) FiO2 (%):  [35 %-60 %] 35 % (09/02 0858) Weight:  [93.1 kg] 93.1 kg (09/02 1008) Last BM Date: 05/07/19  Intake/Output from previous day: 09/01 0701 - 09/02 0700 In: 1401.3 [P.O.:240; I.V.:382.1; IV Piggyback:779.2] Out: 200 [Urine:200] Intake/Output this shift: Total I/O In: 360 [P.O.:360] Out: 300 [Urine:300]  PE: Abd: soft, still somewhat tender in epigastrium and RUQ, +BS, obese,   Lab Results:  Recent Labs    05/08/19 0352 05/09/19 0414  WBC 10.4 8.3  HGB 12.1 12.0  HCT 36.6 38.0  PLT 279 269   BMET Recent Labs    05/08/19 0352 05/09/19 0713  NA 138 138  K 2.7* 4.2  CL 93* 98  CO2 32 26  GLUCOSE 249* 154*  BUN 13 17  CREATININE 0.78 0.84  CALCIUM 8.4* 8.3*   PT/INR Recent Labs    05/07/19 1504  LABPROT 16.2*  INR 1.3*   CMP     Component Value Date/Time   NA 138 05/09/2019 0713   K 4.2 05/09/2019 0713   CL 98 05/09/2019 0713   CO2 26 05/09/2019 0713   GLUCOSE 154 (H) 05/09/2019 0713   BUN 17 05/09/2019 0713   CREATININE 0.84 05/09/2019 0713   CALCIUM 8.3 (L) 05/09/2019 0713   PROT 6.3 (L) 05/08/2019 0352   ALBUMIN 2.8 (L) 05/08/2019 0352   AST 14 (L) 05/08/2019 0352   ALT 13 05/08/2019 0352   ALKPHOS 100 05/08/2019 0352   BILITOT 2.0 (H) 05/08/2019 0352   GFRNONAA >60 05/09/2019 0713   GFRAA >60 05/09/2019 0713   Lipase     Component Value Date/Time   LIPASE 17 05/07/2019 1129       Studies/Results: Dg Chest 2 View  Result Date: 05/07/2019  CLINICAL DATA:  Shortness of breath EXAM: CHEST - 2 VIEW COMPARISON:  Jan 12, 2019 FINDINGS: Again noted is a tracheostomy tube is at the level of the clavicular heads. There is increased interstitial markings with pulmonary vascular congestion. Blunting of the bilateral costophrenic angles, likely trace effusions. Again noted is marked cardiomegaly. IMPRESSION: Cardiomegaly with interstitial edema and trace bilateral pleural effusions. Electronically Signed   By: Prudencio Pair M.D.   On: 05/07/2019 13:20   US Abdomen Limited Ruq  Result Date: 05/07/2019 CLINICAL DATA:  Right upper quadrant pain EXAM: ULTRASOUND ABDOMEN LIMITED RIGHT UPPER QUADRANT COMPARISON:  None. FINDINGS: Gallbladder: There is a shadowing gallstone present measuring 1 cm. No wall thickening visualized. No sonographic Murphy sign noted by sonographer. Common bile duct: Diameter: 3 mm Liver: No focal lesion identified. Within normal limits in parenchymal echogenicity. Portal vein is patent on color Doppler imaging with normal direction of blood flow towards the liver. Other: None. IMPRESSION: Cholelithiasis without evidence of acute cholecystitis Electronically Signed   By: Prudencio Pair M.D.   On: 05/07/2019 13:48    Anti-infectives: Anti-infectives (From admission, onward)   Start     Dose/Rate Route Frequency Ordered Stop   05/07/19 1630  ceFEPIme (MAXIPIME) 2 g in sodium chloride 0.9 % 100 mL IVPB     2 g 200 mL/hr over 30 Minutes Intravenous Every 8 hours 05/07/19 1612     05/07/19 1600  metroNIDAZOLE (FLAGYL) IVPB 500 mg     500 mg 100 mL/hr over 60 Minutes Intravenous Every 8 hours 05/07/19 1548         Assessment/Plan COPD Chronic respiratory failure s/p trach CAD/Hx of MI s/p stents and CABD 3570 Chronic diastolic CHF Hx of DVT  Anticoagulated on coumadin- INR pending, last dose was 8/29 HTN HLD IDDM Morbid Obesity  Cholelithiasis with RUQ pain - still with some mild RUQ tenderness, but improving -eval  from cardiology noted, and agree that patient is high risk and would like to avoid surgical intervention at this time -cont abx for now and cont with conservative management with expectation to avoid surgical intervention if able. -TB down yesterday to 2.0, but indirect was level that was elevated so doubt this is secondary to gallstones.  FEN: CLD VTE: SCDs, heparin gtt ID: Maxipime/Flagyl    LOS: 2 days    [name] , [facility] Surgery 05/09/2019, 10:53 AM Pager: [phone]

## 2019-05-09 NOTE — Progress Notes (Signed)
Progress Note  Patient Name: Theresa [name] Date of Encounter: 05/09/2019  Primary Cardiologist: [name], MD   Subjective   She reports feeling better today. Breathing is less labored. She feels like her LE edema is improved. Still with some back pain. Abdominal pain is overall improved.   Inpatient Medications    Scheduled Meds: . atorvastatin  20 mg Oral Daily  . famotidine  20 mg Oral Daily  . furosemide  80 mg Intravenous BID  . gabapentin  300 mg Oral BID  . gabapentin  600 mg Oral QHS  . insulin aspart  0-15 Units Subcutaneous TID WC  . insulin aspart  3 Units Subcutaneous TID WC  . insulin glargine  33 Units Subcutaneous q morning - 10a  . losartan  25 mg Oral Daily  . metoprolol succinate  25 mg Oral Daily  . sertraline  50 mg Oral Daily  . sodium chloride flush  3 mL Intravenous Q12H  . tamsulosin  0.4 mg Oral Daily   Continuous Infusions: . sodium chloride 500 mL (05/08/19 0150)  . ceFEPime (MAXIPIME) IV 200 mL/hr at 05/09/19 0100  . heparin 1,300 Units/hr (05/09/19 0100)  . metronidazole 500 mg (05/09/19 0050)   PRN Meds: sodium chloride, acetaminophen **OR** acetaminophen, albuterol, fentaNYL (SUBLIMAZE) injection, HYDROcodone-acetaminophen, LORazepam, ondansetron **OR** ondansetron (ZOFRAN) IV   Vital Signs    Vitals:   05/08/19 2308 05/08/19 2341 05/09/19 0445 05/09/19 0516  BP: (!) 93/58   101/74  Pulse: 77 80 75 79  Resp: 13 15 18    Temp: 99.3 F (37.4 C)   97.6 F (36.4 C)  TempSrc: Oral   Oral  SpO2: 95% 94% 98% 99%  Weight:      Height:        Intake/Output Summary (Last 24 hours) at 05/09/2019 0752 Last data filed at 05/09/2019 0100 Gross per 24 hour  Intake 1401.28 ml  Output 200 ml  Net 1201.28 ml   Filed Weights   05/07/19 1114 05/07/19 2022 05/08/19 0500  Weight: 99.8 kg 90.9 kg 90.9 kg    Telemetry    NSR - Personally Reviewed  Physical Exam   GEN: Sitting upright in bed in no acute distress.   Neck: No JVD, no  carotid bruits Cardiac: RRR, no murmurs, rubs, or gallops.  Respiratory: +trach. Diffuse rhonchi; no wheezing or rales appreciated GI: NABS, Soft, obese, mild RUQ TTP, non-distended  MS: No pitting edema; No deformity. Neuro:  Nonfocal, moving all extremities spontaneously Psych: Normal affect   Labs    Chemistry Recent Labs  Lab 05/07/19 1129 05/08/19 0352  NA 137 138  K 3.2* 2.7*  CL 92* 93*  CO2 28 32  GLUCOSE 302* 249*  BUN 10 13  CREATININE 0.75 0.78  CALCIUM 8.8* 8.4*  PROT 6.9 6.3*  ALBUMIN 3.1* 2.8*  AST 17 14*  ALT 14 13  ALKPHOS 117 100  BILITOT 2.3* 2.0*  GFRNONAA >60 >60  GFRAA >60 >60  ANIONGAP 17* 13     Hematology Recent Labs  Lab 05/07/19 1129 05/08/19 0352 05/09/19 0414  WBC 12.2* 10.4 8.3  RBC 4.25 3.77* 3.83*  HGB 13.6 12.1 12.0  HCT 41.6 36.6 38.0  MCV 97.9 97.1 99.2  MCH 32.0 32.1 31.3  MCHC 32.7 33.1 31.6  RDW 18.1* 18.0* 18.3*  PLT 333 279 269    Cardiac EnzymesNo results for input(s): TROPONINI in the last 168 hours. No results for input(s): TROPIPOC in the last 168 hours.  BNP Recent Labs  Lab 05/07/19 1129  BNP 2,264.5*     DDimer No results for input(s): DDIMER in the last 168 hours.   Radiology    Dg Chest 2 View  Result Date: 05/07/2019 CLINICAL DATA:  Shortness of breath EXAM: CHEST - 2 VIEW COMPARISON:  Jan 12, 2019 FINDINGS: Again noted is a tracheostomy tube is at the level of the clavicular heads. There is increased interstitial markings with pulmonary vascular congestion. Blunting of the bilateral costophrenic angles, likely trace effusions. Again noted is marked cardiomegaly. IMPRESSION: Cardiomegaly with interstitial edema and trace bilateral pleural effusions. Electronically Signed   By: Prudencio Pair M.D.   On: 05/07/2019 13:20   US Abdomen Limited Ruq  Result Date: 05/07/2019 CLINICAL DATA:  Right upper quadrant pain EXAM: ULTRASOUND ABDOMEN LIMITED RIGHT UPPER QUADRANT COMPARISON:  None. FINDINGS:  Gallbladder: There is a shadowing gallstone present measuring 1 cm. No wall thickening visualized. No sonographic Murphy sign noted by sonographer. Common bile duct: Diameter: 3 mm Liver: No focal lesion identified. Within normal limits in parenchymal echogenicity. Portal vein is patent on color Doppler imaging with normal direction of blood flow towards the liver. Other: None. IMPRESSION: Cholelithiasis without evidence of acute cholecystitis Electronically Signed   By: Prudencio Pair M.D.   On: 05/07/2019 13:48    Cardiac Studies   Echocardiogram 01/2019: IMPRESSIONS   1. Severe hypokinesis of the left ventricular, basal-mid inferoseptal wall, inferior wall and inferolateral wall. 2. The left ventricle has mild-moderately reduced systolic function, with an ejection fraction of 40-45%. The cavity size was normal. Left ventricular diastolic Doppler parameters are consistent with pseudonormal. Elevated mean left atrial pressure. 3. The right ventricle has mildly reduced systolic function. The cavity was normal. There is no increase in right ventricular wall thickness. Right ventricular systolic pressure is moderately elevated with an estimated pressure of 58 mmHg. 4. Mitral valve regurgitation is mild to moderate by color flow Doppler. The MR jet is centrally-directed. 5. The inferior vena cava was dilated in size with >50% respiratory variability. 6. Left atrial size was moderately dilated. 7. Right atrial size was mildly dilated.  Right/Left heart cath 03/2018:  Severe native coronary artery disease with total occlusion of the proximal LAD, total occlusion of the proximal RCA, and patent circumflex with patent proximal stent with eccentric 50% proximal narrowing. Distal obtuse marginal branches are severely and diffusely diseased.  Bypass graft failure with total occlusion of SVG to RCA.  Patent LIMA to LAD.  Right coronary territory is supplied by collaterals from LAD and  circumflex.  Left ventricular systolic dysfunction with EF 35 to 45%. Mid to distal inferior wall akinesis. Inferobasal and inferoapical wall severely hypokinetic. Left ventricular end-diastolic pressure elevated at 19 mmHg  Moderate to severe pulmonary hypertension  RECOMMENDATIONS:   Resume Coumadin therapy overlap with heparin  Continue Plavix  Management of pulmonary status to maintain adequate oxygenation which may help decrease severity of pulmonary hypertension.   Recommend to resume Warfarin, at currently prescribed dose and frequency, on today. Recommend concurrent antiplatelet therapy of Clopidogrel 75mg  daily for Indefinite.  Patient Profile     53 y.o. female with PMH of CAD (s/p CABG '07 and STEMI PCI '17), chronic respiratory failure 2/2 COPD requiring trach ('02), lupus anticoagulant with DVTs (on warfarin) and HFmrEF (96%) with diastolic dysfunction who presents with abdominal pain, nausea, vomiting, found to have cholelithasis with suspected cholecytisis. Cardiology following for preop assessment and optimization for surgery.   Assessment & Plan  1. Preoperative assessment: Patient was deemed to be at elevated risk due to cardiac history, poor functional status, and volume overload. Echo 01/2019 with severe hypokinesis of LV, basal-mid inferoseptal wall, inferior wall, and inferolateral wall, with EF 40-45% and mildly reduced RV systolic function. Her last ischemic evaluation was a R/LHC in 2019 with total occlusion of pLAD, pRCA, with patent LCx s/p prox stent (50% narrowing), occlusion of SVG to RCA and patent LIMA with L>R collaterals.  No complaints of recent chest pain.  - She is certainly high risk, though do not see any utility in further cardiac testing at this time.   2. Acute on chronic combined CHF: volume up due to difficulty taking po medications with N/V, she reports missing 2 days of torsemide dosing (80mg  daily). BNP >2000. CXR with interstitial  edema and trace bilateral pleural effusions. Started on IV lasix 80mg  BID with UOP net +1.2L this admission. Weight stagnant this admission (no weight today). She reports poor UOP yesterday.  - Will discuss addition of metolazone vs increasing lasix  vs lasix gtt with Dr. [place] given poor UOP response  - Continue to monitor strict I&Os and daily weights - Continue to monitor electrolytes and replete as needed to maintain K>4, Mg >2  3. Electrolyte imbalance: K 2.7 yesterday, aggressively repleted with IV + PO K, and improved to 4.2 today. Mg 1.5 yesterday - Will replete Mg 4g IV today  4. Cholecystitis: patient presented with nausea/vomiting and abdominal pain. Surgery following with plans CCY vs perc chole drain vs antibiotics alone. Awaitng pulmonary consult for risk assessment.  - Continue management per surgery and primary team  For questions or updates, please contact [facility] Please consult [email or web address] for contact info under Cardiology/STEMI.      Signed, Abigail Butts, PA-C  05/09/2019, 7:52 AM   [phone]

## 2019-05-09 NOTE — Progress Notes (Signed)
[place] for heparin Indication: hx of  DVT  Allergies  Allergen Reactions  . Penicillins Rash    Has patient had a PCN reaction causing immediate rash, facial/tongue/throat swelling, SOB or lightheadedness with hypotension: Yes Has patient had a PCN reaction causing severe rash involving mucus membranes or skin necrosis: No Has patient had a PCN reaction that required hospitalization No Has patient had a PCN reaction occurring within the last 10 years: No If all of the above answers are "NO", then may proceed with Cephalosporin use.   REACTION: rash  . Ciprofloxacin     nausea  . Ibuprofen Rash    swelling in leg     Patient Measurements: Height: 4\' 10"  (147.3 cm) Weight: 200 lb 6.4 oz (90.9 kg) IBW/kg (Calculated) : 40.9 Heparin Dosing Weight: 65.7 kg  Vital Signs: Temp: 97.4 F (36.3 C) (09/02 0818) Temp Source: Oral (09/02 0818) BP: 104/71 (09/02 0818) Pulse Rate: 74 (09/02 0818)  Labs: Recent Labs    05/07/19 1129 05/07/19 1504 05/08/19 0352 05/09/19 0414 05/09/19 0713  HGB 13.6  --  12.1 12.0  --   HCT 41.6  --  36.6 38.0  --   PLT 333  --  279 269  --   LABPROT  --  16.2*  --   --   --   INR  --  1.3*  --   --   --   HEPARINUNFRC  --   --  0.40 0.60  --   CREATININE 0.75  --  0.78  --  0.84    Estimated Creatinine Clearance: 74.5 mL/min (by C-G formula based on SCr of 0.84 mg/dL).  Assessment: 53 y.o. female with h/o DVT, Coumadin on hold and INR 1.3 continues on IV heparin pending plans for surgery  Goal of Therapy:  Heparin level 0.3-0.7 units/ml Monitor platelets by anticoagulation protocol: Yes   Plan:  Continue heparin at 1300 units / hr Follow up AM labs  Thank you  Anette Guarneri, PharmD [phone]  05/09/2019,8:42 AM

## 2019-05-09 NOTE — Plan of Care (Signed)

## 2019-05-10 LAB — GLUCOSE, CAPILLARY
Glucose-Capillary: 187 mg/dL — ABNORMAL HIGH (ref 70–99)
Glucose-Capillary: 122 mg/dL — ABNORMAL HIGH (ref 70–99)
Glucose-Capillary: 158 mg/dL — ABNORMAL HIGH (ref 70–99)
Glucose-Capillary: 205 mg/dL — ABNORMAL HIGH (ref 70–99)

## 2019-05-10 LAB — CBC
HCT: 39.5 % (ref 36.0–46.0)
Hemoglobin: 12.7 g/dL (ref 12.0–15.0)
MCH: 31.7 pg (ref 26.0–34.0)
MCHC: 32.2 g/dL (ref 30.0–36.0)
MCV: 98.5 fL (ref 80.0–100.0)
Platelets: 274 10*3/uL (ref 150–400)
RBC: 4.01 MIL/uL (ref 3.87–5.11)
RDW: 18.2 % — ABNORMAL HIGH (ref 11.5–15.5)
WBC: 8.4 10*3/uL (ref 4.0–10.5)
nRBC: 0.2 % (ref 0.0–0.2)

## 2019-05-10 LAB — BASIC METABOLIC PANEL
Anion gap: 13 (ref 5–15)
BUN: 16 mg/dL (ref 6–20)
CO2: 30 mmol/L (ref 22–32)
Calcium: 9.1 mg/dL (ref 8.9–10.3)
Chloride: 93 mmol/L — ABNORMAL LOW (ref 98–111)
Creatinine, Ser: 0.96 mg/dL (ref 0.44–1.00)
GFR calc Af Amer: >60 mL/min (ref >60)
GFR calc non Af Amer: >60 mL/min (ref >60)
Glucose, Bld: 177 mg/dL — ABNORMAL HIGH (ref 70–99)
Potassium: 3.8 mmol/L (ref 3.5–5.1)
Sodium: 136 mmol/L (ref 135–145)

## 2019-05-10 LAB — MAGNESIUM: Magnesium: 1.9 mg/dL (ref 1.7–2.4)

## 2019-05-10 LAB — HEPARIN LEVEL (UNFRACTIONATED): Heparin Unfractionated: 0.48 IU/mL (ref 0.30–0.70)

## 2019-05-10 MED ORDER — METOLAZONE 5 MG PO TABS
5.0000 mg | ORAL_TABLET | Freq: Once | ORAL | Status: AC
  Administered 2019-05-10: 12:00:00 5 mg via ORAL
  Filled 2019-05-10: qty 1

## 2019-05-10 NOTE — Progress Notes (Signed)
PROGRESS NOTE    Theresa [name]  XBD:[id number] DOB: [birth year] DOA: 05/07/2019 PCP: Monico Blitz, MD   Brief Narrative:  HPI On 05/07/2019 by Dr. Fuller Plan Theresa [name] is a 53 y.o. female with medical history significant of CAD s/p Cabg, chronic respiratory failure due to COPD trach dependent since 2002, PEA arrest due to a dislodged trach, DVT and lupus anticoagulant on chronic Coumadin, hypertension, and insulin-dependent diabetes mellitus type 2, and morbid obesity; who presents with complaints of abdominal pain.  Pain is located in the epigastrically and in the right quadrant with radiation to her back.  Associated symptoms included nausea, vomiting, and diarrhea over the last 2 days.  Endorses increasing shortness of breath, abdominal distention, productive cough, chills, and sweats.  She was evaluated in the emergency department 2 weeks ago and noted to have cholelithiasis without cholecystitis on CT scan.  Interim history Patient admitted with right upper quadrant abdominal pain found to have cholelithiasis with presumed cholecystitis.  General surgery consulted and appreciated.  Patient also noted to have acute CHF exacerbation as well as a respiratory failure and hypoxia.  Cardiology and pulmonology consulted and following.  Patient currently diuresing. Assessment & Plan   Cholelithiasis with right upper quadrant abdominal pain, presumed cholecystitis -Right upper quadrant ultrasound showed gallstones -General surgery consulted and appreciated feels that patient clinically has acute cholecystitis.  Patient was started on IV cefepime and Flagyl -Cardiology and pulmonology consulted for preoperative assessment -Patient felt to be high risk -per surgery, if worsens, consider perc chole drain  Acute on chronic systolic CHF exacerbation -Echocardiogram May 2020 showed an EF of 40 to 45% -Chest x-ray with cardiomegaly, interstitial edema -BNP 2226 -Cardiology consulted and  appreciated -Patient currently on IV Lasix, given one dose of metolazone on 9/2 -Monitor intake and output, daily weights -UOP over past 24hrs 3900cc -Cardiology planning for additional metolazone today along with IV lasix 80mg  BID -Continue metoprolol, statin, losartan -Currently on heparin  Acute on chronic respiratory failure with hypoxia/COPD/trach dependent -In the emergency department, patient was noted to be hypoxic and needing 8 L of oxygen.  Baseline 3 to 4 L -Suspect secondary to volume overload -As above, pulmonology consulted and appreciated -Continue pulmonary toilet, nebulizer treatments as needed  History of DVT/lupus anticoagulant -Patient with known history of left lower extremity DVT as well as lupus anticoagulant. -Has been taking Coumadin prior to admission-however stopped 2 days due to nausea and vomiting -Currently on heparin  Severe hypokalemia/hypomagnesemia -Resolved with replacement -continue to monitor and replace as needed  Diabetes mellitus, type II -Continue Lantus, insulin sliding scale and CBG monitoring  Anxiety/depression -Continue Zoloft  Essential hypertension -BP stable, continue metoprolol, losartan  CAD/history of CABG, hyperlipidemia -Continue metoprolol, losartan, statin  Morbid obesity -BMI 41.88 -Patient will need to follow up with her PCP to discuss lifestyle modifications  DVT Prophylaxis  Heparin  Code Status: Full  Family Communication: None at bedside  Disposition Plan: Admitted. Pending improvement in fluid status and further surgical recommendations  Consultants General surgery Cardiology Pulmonology   Procedures  None  Antibiotics   Anti-infectives (From admission, onward)   Start     Dose/Rate Route Frequency Ordered Stop   05/07/19 1630  ceFEPIme (MAXIPIME) 2 g in sodium chloride 0.9 % 100 mL IVPB     2 g 200 mL/hr over 30 Minutes Intravenous Every 8 hours 05/07/19 1612     05/07/19 1600  metroNIDAZOLE  (FLAGYL) IVPB 500 mg     500 mg  100 mL/hr over 60 Minutes Intravenous Every 8 hours 05/07/19 1548        Subjective:   Leron Croak seen and examined today.  Feels abdomen feels a bit distended but feels pain is better than previous days. Able to tolerate intake without further pain. Denies current chest pain, nausea, vomiting, dizziness, headache. Feels shortness of breath has improved, but worsens with movement.    Objective:   Vitals:   05/10/19 0000 05/10/19 0359 05/10/19 0432 05/10/19 0823  BP:    107/78  Pulse: 73 74  74  Resp: 14 18  12   Temp:   98.2 F (36.8 C)   TempSrc:   Oral   SpO2: 91% 92%  96%  Weight:      Height:        Intake/Output Summary (Last 24 hours) at 05/10/2019 0922 Last data filed at 05/10/2019 0854 Gross per 24 hour  Intake 1399.52 ml  Output 4300 ml  Net -2900.48 ml   Filed Weights   05/07/19 2022 05/08/19 0500 05/09/19 1008  Weight: 90.9 kg 90.9 kg 93.1 kg   Exam  General: Well developed, chronically ill appearing, NAD  HEENT: NCAT, mucous membranes moist.   Neck: +trach  Cardiovascular: S1 S2 auscultated, RRR, no murmur  Respiratory: diminished breath sounds anteriorly    Abdomen: Soft, obese, RUQ TTP, nondistended, + bowel sounds  Extremities: warm dry without cyanosis clubbing or edema  Neuro: AAOx3, nonfocal  Psych: Pleasant, appropriate mood and affect  Data Reviewed: I have personally reviewed following labs and imaging studies  CBC: Recent Labs  Lab 05/07/19 1129 05/08/19 0352 05/09/19 0414 05/10/19 0402  WBC 12.2* 10.4 8.3 8.4  HGB 13.6 12.1 12.0 12.7  HCT 41.6 36.6 38.0 39.5  MCV 97.9 97.1 99.2 98.5  PLT 333 279 269 622   Basic Metabolic Panel: Recent Labs  Lab 05/07/19 1129 05/08/19 0352 05/09/19 0713 05/10/19 0402  NA 137 138 138 136  K 3.2* 2.7* 4.2 3.8  CL 92* 93* 98 93*  CO2 28 32 26 30  GLUCOSE 302* 249* 154* 177*  BUN 10 13 17 16   CREATININE 0.75 0.78 0.84 0.96  CALCIUM 8.8* 8.4* 8.3* 9.1   MG  --  1.5* 1.7 1.9   GFR: Estimated Creatinine Clearance: 66.1 mL/min (by C-G formula based on SCr of 0.96 mg/dL). Liver Function Tests: Recent Labs  Lab 05/07/19 1129 05/08/19 0352  AST 17 14*  ALT 14 13  ALKPHOS 117 100  BILITOT 2.3* 2.0*  PROT 6.9 6.3*  ALBUMIN 3.1* 2.8*   Recent Labs  Lab 05/07/19 1129  LIPASE 17   No results for input(s): AMMONIA in the last 168 hours. Coagulation Profile: Recent Labs  Lab 05/07/19 1504  INR 1.3*   Cardiac Enzymes: No results for input(s): CKTOTAL, CKMB, CKMBINDEX, TROPONINI in the last 168 hours. BNP (last 3 results) No results for input(s): PROBNP in the last 8760 hours. HbA1C: No results for input(s): HGBA1C in the last 72 hours. CBG: Recent Labs  Lab 05/09/19 0817 05/09/19 1156 05/09/19 1630 05/09/19 2101 05/10/19 0844  GLUCAP 138* 158* 141* 142* 187*   Lipid Profile: No results for input(s): CHOL, HDL, LDLCALC, TRIG, CHOLHDL, LDLDIRECT in the last 72 hours. Thyroid Function Tests: No results for input(s): TSH, T4TOTAL, FREET4, T3FREE, THYROIDAB in the last 72 hours. Anemia Panel: No results for input(s): VITAMINB12, FOLATE, FERRITIN, TIBC, IRON, RETICCTPCT in the last 72 hours. Urine analysis:    Component Value Date/Time   COLORURINE AMBER (  A) 05/08/2019 0405   APPEARANCEUR HAZY (A) 05/08/2019 0405   LABSPEC 1.040 (H) 05/08/2019 0405   PHURINE 5.0 05/08/2019 0405   GLUCOSEU 50 (A) 05/08/2019 0405   HGBUR NEGATIVE 05/08/2019 0405   BILIRUBINUR SMALL (A) 05/08/2019 0405   KETONESUR 5 (A) 05/08/2019 0405   PROTEINUR >=300 (A) 05/08/2019 0405   NITRITE NEGATIVE 05/08/2019 0405   LEUKOCYTESUR NEGATIVE 05/08/2019 0405   Sepsis Labs: @LABRCNTIP (procalcitonin:4,lacticidven:4)  ) Recent Results (from the past 240 hour(s))  SARS Coronavirus 2 [facility] order, Performed in [facility] hospital lab) Nasopharyngeal Nasopharyngeal Swab     Status: None   Collection Time: 05/07/19  3:59 PM   Specimen:  Nasopharyngeal Swab  Result Value Ref Range Status   SARS Coronavirus 2 NEGATIVE NEGATIVE Final    Comment: (NOTE) If result is NEGATIVE SARS-CoV-2 target nucleic acids are NOT DETECTED. The SARS-CoV-2 RNA is generally detectable in upper and lower  respiratory specimens during the acute phase of infection. The lowest  concentration of SARS-CoV-2 viral copies this assay can detect is 250  copies / mL. A negative result does not preclude SARS-CoV-2 infection  and should not be used as the sole basis for treatment or other  patient management decisions.  A negative result may occur with  improper specimen collection / handling, submission of specimen other  than nasopharyngeal swab, presence of viral mutation(s) within the  areas targeted by this assay, and inadequate number of viral copies  (<250 copies / mL). A negative result must be combined with clinical  observations, patient history, and epidemiological information. If result is POSITIVE SARS-CoV-2 target nucleic acids are DETECTED. The SARS-CoV-2 RNA is generally detectable in upper and lower  respiratory specimens dur ing the acute phase of infection.  Positive  results are indicative of active infection with SARS-CoV-2.  Clinical  correlation with patient history and other diagnostic information is  necessary to determine patient infection status.  Positive results do  not rule out bacterial infection or co-infection with other viruses. If result is PRESUMPTIVE POSTIVE SARS-CoV-2 nucleic acids MAY BE PRESENT.   A presumptive positive result was obtained on the submitted specimen  and confirmed on repeat testing.  While 2019 novel coronavirus  (SARS-CoV-2) nucleic acids may be present in the submitted sample  additional confirmatory testing may be necessary for epidemiological  and / or clinical management purposes  to differentiate between  SARS-CoV-2 and other Sarbecovirus currently known to infect humans.  If clinically  indicated additional testing with an alternate test  methodology [phone]) is advised. The SARS-CoV-2 RNA is generally  detectable in upper and lower respiratory sp ecimens during the acute  phase of infection. The expected result is Negative. Fact Sheet for Patients:  StrictlyIdeas.no Fact Sheet for Healthcare Providers: BankingDealers.co.za This test is not yet approved or cleared by the [place] FDA and has been authorized for detection and/or diagnosis of SARS-CoV-2 by FDA under an Emergency Use Authorization (EUA).  This EUA will remain in effect (meaning this test can be used) for the duration of the COVID-19 declaration under Section 564(b)(1) of the Act, 21 U.S.C. section 360bbb-3(b)(1), unless the authorization is terminated or revoked sooner. Performed at [facility], [place] [address] [place], [place] [address]       Radiology Studies: No results found.   Scheduled Meds: . atorvastatin  20 mg Oral Daily  . famotidine  20 mg Oral Daily  . furosemide  80 mg Intravenous BID  . gabapentin  300 mg  Oral BID  . gabapentin  600 mg Oral QHS  . insulin aspart  0-15 Units Subcutaneous TID WC  . insulin aspart  3 Units Subcutaneous TID WC  . insulin glargine  33 Units Subcutaneous q morning - 10a  . losartan  25 mg Oral Daily  . metolazone  5 mg Oral Once  . metoprolol succinate  25 mg Oral Daily  . sertraline  50 mg Oral Daily  . sodium chloride flush  3 mL Intravenous Q12H  . tamsulosin  0.4 mg Oral Daily   Continuous Infusions: . sodium chloride 500 mL (05/08/19 0150)  . ceFEPime (MAXIPIME) IV Stopped (05/10/19 0202)  . heparin 1,300 Units/hr (05/10/19 0425)  . metronidazole 500 mg (05/10/19 0854)     LOS: 3 days   Time Spent in minutes   30 minutes  Toryn Mcclinton D.O. on 05/10/2019 at 9:22 AM  Between 7am to 7pm - Please see pager noted on [email or web address]  After 7pm go to [email or web address]  And look for the  night coverage person covering for me after hours  [facility] Office  [phone]

## 2019-05-10 NOTE — Progress Notes (Signed)
Patient ID: Theresa [name], female   DOB: [birth year], 53 y.o.   MRN: [id number]       Subjective: Patient was very hungry yesterday and so ultimately started on low fat diet. She ate last night and it did not make her pain worse.  She states her pain is only slightly better than on admission.  Objective: Vital signs in last 24 hours: Temp:  [98 F (36.7 C)-98.2 F (36.8 C)] 98.2 F (36.8 C) (09/03 0432) Pulse Rate:  [66-76] 74 (09/03 0823) Resp:  [12-25] 12 (09/03 0823) BP: (91-110)/(63-78) 107/78 (09/03 0823) SpO2:  [91 %-96 %] 96 % (09/03 0823) FiO2 (%):  [28 %-35 %] 28 % (09/03 0823) Weight:  [93.1 kg] 93.1 kg (09/02 1008) Last BM Date: 05/08/19  Intake/Output from previous day: 09/02 0701 - 09/03 0700 In: 1399.5 [P.O.:360; I.V.:337.4; IV Piggyback:702.2] Out: 3900 [Urine:3900] Intake/Output this shift: No intake/output data recorded.  PE: Heart: regular Lungs: trach in place on trach collar Abd: obese, soft, still somewhat tender in RUQ, +BS  Lab Results:  Recent Labs    05/09/19 0414 05/10/19 0402  WBC 8.3 8.4  HGB 12.0 12.7  HCT 38.0 39.5  PLT 269 274   BMET Recent Labs    05/09/19 0713 05/10/19 0402  NA 138 136  K 4.2 3.8  CL 98 93*  CO2 26 30  GLUCOSE 154* 177*  BUN 17 16  CREATININE 0.84 0.96  CALCIUM 8.3* 9.1   PT/INR Recent Labs    05/07/19 1504  LABPROT 16.2*  INR 1.3*   CMP     Component Value Date/Time   NA 136 05/10/2019 0402   K 3.8 05/10/2019 0402   CL 93 (L) 05/10/2019 0402   CO2 30 05/10/2019 0402   GLUCOSE 177 (H) 05/10/2019 0402   BUN 16 05/10/2019 0402   CREATININE 0.96 05/10/2019 0402   CALCIUM 9.1 05/10/2019 0402   PROT 6.3 (L) 05/08/2019 0352   ALBUMIN 2.8 (L) 05/08/2019 0352   AST 14 (L) 05/08/2019 0352   ALT 13 05/08/2019 0352   ALKPHOS 100 05/08/2019 0352   BILITOT 2.0 (H) 05/08/2019 0352   GFRNONAA >60 05/10/2019 0402   GFRAA >60 05/10/2019 0402   Lipase     Component Value Date/Time   LIPASE 17  05/07/2019 1129       Studies/Results: No results found.  Anti-infectives: Anti-infectives (From admission, onward)   Start     Dose/Rate Route Frequency Ordered Stop   05/07/19 1630  ceFEPIme (MAXIPIME) 2 g in sodium chloride 0.9 % 100 mL IVPB     2 g 200 mL/hr over 30 Minutes Intravenous Every 8 hours 05/07/19 1612     05/07/19 1600  metroNIDAZOLE (FLAGYL) IVPB 500 mg     500 mg 100 mL/hr over 60 Minutes Intravenous Every 8 hours 05/07/19 1548         Assessment/Plan  COPD Chronic respiratory failure s/p trach CAD/Hx of MI s/p stents and CABD 7035 Chronic diastolic CHF Hx of DVT  Anticoagulated on coumadin- INR 1.3 HTN HLD IDDM Morbid Obesity  Cholelithiasis with RUQ pain -still with some mild RUQ tenderness, states it is only slightly better than on admit.  Tolerating a low fat diet with no increase in pain. WBC normal with no fever.  Cont treat with abx, but if fails to continue to improve, may require perc chole drain. -eval from cardiology noted, and agree that patient is high risk and would like to avoid surgical intervention at  this time -cont abx for now and cont with conservative management with expectation to avoid surgical intervention if able.  FEN:low fat diet VTE: SCDs,heparin gtt WB:[id number]   LOS: 3 days    Theresa [name] , [facility] 05/10/2019, 8:33 AM Pager: [phone]

## 2019-05-10 NOTE — Progress Notes (Signed)
This note also relates to the following rows which could not be included: Pulse Rate - Cannot attach notes to unvalidated device data SpO2 - Cannot attach notes to unvalidated device data  Pt with periods of desaturation to 70's while eating snack. FiO2 increased at this time. RT will wean O2 as tolerated throughout the night.

## 2019-05-10 NOTE — Progress Notes (Signed)
Progress Note  Patient Name: Theresa [name] Date of Encounter: 05/10/2019  Primary Cardiologist: [name], MD   Subjective   She reports feeling better today. Breathing is improved but gets SOB with any activity.  Abdominal pain is still present but better.   Inpatient Medications    Scheduled Meds:  atorvastatin  20 mg Oral Daily   famotidine  20 mg Oral Daily   furosemide  80 mg Intravenous BID   gabapentin  300 mg Oral BID   gabapentin  600 mg Oral QHS   insulin aspart  0-15 Units Subcutaneous TID WC   insulin aspart  3 Units Subcutaneous TID WC   insulin glargine  33 Units Subcutaneous q morning - 10a   losartan  25 mg Oral Daily   metoprolol succinate  25 mg Oral Daily   sertraline  50 mg Oral Daily   sodium chloride flush  3 mL Intravenous Q12H   tamsulosin  0.4 mg Oral Daily   Continuous Infusions:  sodium chloride 500 mL (05/08/19 0150)   ceFEPime (MAXIPIME) IV Stopped (05/10/19 0202)   heparin 1,300 Units/hr (05/10/19 0425)   metronidazole Stopped (05/10/19 0109)   PRN Meds: sodium chloride, acetaminophen **OR** acetaminophen, albuterol, fentaNYL (SUBLIMAZE) injection, HYDROcodone-acetaminophen, LORazepam, ondansetron **OR** ondansetron (ZOFRAN) IV   Vital Signs    Vitals:   05/10/19 0000 05/10/19 0359 05/10/19 0432 05/10/19 0823  BP:    107/78  Pulse: 73 74  74  Resp: 14 18  12   Temp:   98.2 F (36.8 C)   TempSrc:   Oral   SpO2: 91% 92%  96%  Weight:      Height:        Intake/Output Summary (Last 24 hours) at 05/10/2019 0829 Last data filed at 05/10/2019 0427 Gross per 24 hour  Intake 1399.52 ml  Output 3900 ml  Net -2500.48 ml   Filed Weights   05/07/19 2022 05/08/19 0500 05/09/19 1008  Weight: 90.9 kg 90.9 kg 93.1 kg    Telemetry    NSR - Personally Reviewed  Physical Exam   GEN: No acute distress.   Neck: No JVD, no carotid bruits Cardiac: RRR, no murmurs, rubs, or gallops.  Respiratory: +trach. Diffuse  rhonchi; no wheezing or rales appreciated GI: NABS, Soft, obese, mild RUQ TTP, non-distended  MS: No pitting edema; No deformity. Neuro:  Nonfocal, moving all extremities spontaneously Psych: Normal affect   Labs    Chemistry Recent Labs  Lab 05/07/19 1129 05/08/19 0352 05/09/19 0713 05/10/19 0402  NA 137 138 138 136  K 3.2* 2.7* 4.2 3.8  CL 92* 93* 98 93*  CO2 28 32 26 30  GLUCOSE 302* 249* 154* 177*  BUN 10 13 17 16   CREATININE 0.75 0.78 0.84 0.96  CALCIUM 8.8* 8.4* 8.3* 9.1  PROT 6.9 6.3*  --   --   ALBUMIN 3.1* 2.8*  --   --   AST 17 14*  --   --   ALT 14 13  --   --   ALKPHOS 117 100  --   --   BILITOT 2.3* 2.0*  --   --   GFRNONAA >60 >60 >60 >60  GFRAA >60 >60 >60 >60  ANIONGAP 17* 13 14 13      Hematology Recent Labs  Lab 05/08/19 0352 05/09/19 0414 05/10/19 0402  WBC 10.4 8.3 8.4  RBC 3.77* 3.83* 4.01  HGB 12.1 12.0 12.7  HCT 36.6 38.0 39.5  MCV 97.1 99.2 98.5  MCH 32.1 31.3 31.7  MCHC 33.1 31.6 32.2  RDW 18.0* 18.3* 18.2*  PLT 279 269 274    Cardiac EnzymesNo results for input(s): TROPONINI in the last 168 hours. No results for input(s): TROPIPOC in the last 168 hours.   BNP Recent Labs  Lab 05/07/19 1129  BNP 2,264.5*     DDimer No results for input(s): DDIMER in the last 168 hours.   Radiology    No results found.  Cardiac Studies   Echocardiogram 01/2019: IMPRESSIONS   1. Severe hypokinesis of the left ventricular, basal-mid inferoseptal wall, inferior wall and inferolateral wall. 2. The left ventricle has mild-moderately reduced systolic function, with an ejection fraction of 40-45%. The cavity size was normal. Left ventricular diastolic Doppler parameters are consistent with pseudonormal. Elevated mean left atrial pressure. 3. The right ventricle has mildly reduced systolic function. The cavity was normal. There is no increase in right ventricular wall thickness. Right ventricular systolic pressure is moderately elevated with  an estimated pressure of 58 mmHg. 4. Mitral valve regurgitation is mild to moderate by color flow Doppler. The MR jet is centrally-directed. 5. The inferior vena cava was dilated in size with >50% respiratory variability. 6. Left atrial size was moderately dilated. 7. Right atrial size was mildly dilated.  Right/Left heart cath 03/2018:  Severe native coronary artery disease with total occlusion of the proximal LAD, total occlusion of the proximal RCA, and patent circumflex with patent proximal stent with eccentric 50% proximal narrowing. Distal obtuse marginal branches are severely and diffusely diseased.  Bypass graft failure with total occlusion of SVG to RCA.  Patent LIMA to LAD.  Right coronary territory is supplied by collaterals from LAD and circumflex.  Left ventricular systolic dysfunction with EF 35 to 45%. Mid to distal inferior wall akinesis. Inferobasal and inferoapical wall severely hypokinetic. Left ventricular end-diastolic pressure elevated at 19 mmHg  Moderate to severe pulmonary hypertension  RECOMMENDATIONS:   Resume Coumadin therapy overlap with heparin  Continue Plavix  Management of pulmonary status to maintain adequate oxygenation which may help decrease severity of pulmonary hypertension.   Recommend to resume Warfarin, at currently prescribed dose and frequency, on today. Recommend concurrent antiplatelet therapy of Clopidogrel 75mg  daily for Indefinite.  Patient Profile     53 y.o. female with PMH of CAD (s/p CABG '07 and STEMI PCI '17), chronic respiratory failure 2/2 COPD requiring trach ('02), lupus anticoagulant with DVTs (on warfarin) and HFmrEF (53%) with diastolic dysfunction who presents with abdominal pain, nausea, vomiting, found to have cholelithasis with suspected cholecytisis. Cardiology following for preop assessment and optimization for surgery.   Assessment & Plan    1. Preoperative assessment: Patient is high risk for  general anesthesia and surgery due to obesity, chronic respiratory failure with trach, lupus, recurrent DVTs, low functional status, CHF and CAD.  Echo 01/2019 with severe hypokinesis of LV, basal-mid inferoseptal wall, inferior wall, and inferolateral wall, with EF 40-45% and mildly reduced RV systolic function. Her last ischemic evaluation was a R/LHC in 2019 with total occlusion of pLAD, pRCA, with patent LCx s/p prox stent (50% narrowing), occlusion of SVG to RCA and patent LIMA with L>R collaterals.  No complaints of recent chest pain.  - I do not see any utility in further cardiac testing at this time.  - will continue efforts at tuning up her CHF - continue conservative management of cholecystitis with IV antibiotics. If conditions worsen could consider percutaneous drainage.   2. Acute on chronic combined CHF:  BNP >2000. CXR with interstitial edema and trace bilateral pleural effusions. Started on IV lasix 80mg  BID with poor urine output initially. Given metolazone yesterday with excellent diruesis. - I/O negative 3900 - will repeat metolazone today - Continue to monitor strict I&Os and daily weights - Continue to monitor electrolytes and replete as needed to maintain K>4, Mg >2 - will check CXR  3. Electrolyte imbalance: potassium normal today.  4. Cholecystitis: patient presented with nausea/vomiting and abdominal pain. Surgery following with plans conservative therapy vs perc chole drain.  - Continue management per surgery and primary team  For questions or updates, please contact [facility] Please consult [email or web address] for contact info under Cardiology/STEMI.      Signed, Carmalita Wakefield [place], MD  05/10/2019, 8:29 AM   [phone]

## 2019-05-10 NOTE — Progress Notes (Signed)
[place] for heparin Indication: hx of  DVT  Allergies  Allergen Reactions  . Penicillins Rash    Has patient had a PCN reaction causing immediate rash, facial/tongue/throat swelling, SOB or lightheadedness with hypotension: Yes Has patient had a PCN reaction causing severe rash involving mucus membranes or skin necrosis: No Has patient had a PCN reaction that required hospitalization No Has patient had a PCN reaction occurring within the last 10 years: No If all of the above answers are "NO", then may proceed with Cephalosporin use.   REACTION: rash  . Ciprofloxacin     nausea  . Ibuprofen Rash    swelling in leg     Patient Measurements: Height: 4\' 10"  (147.3 cm) Weight: 205 lb 4.8 oz (93.1 kg) IBW/kg (Calculated) : 40.9 Heparin Dosing Weight: 65.7 kg  Vital Signs: Temp: 98.2 F (36.8 C) (09/03 0432) Temp Source: Oral (09/03 0432) BP: 107/78 (09/03 0823) Pulse Rate: 74 (09/03 0823)  Labs: Recent Labs    05/07/19 1504 05/08/19 0352 05/09/19 0414 05/09/19 0713 05/10/19 0402  HGB  --  12.1 12.0  --  12.7  HCT  --  36.6 38.0  --  39.5  PLT  --  279 269  --  274  LABPROT 16.2*  --   --   --   --   INR 1.3*  --   --   --   --   HEPARINUNFRC  --  0.40 0.60  --  0.48  CREATININE  --  0.78  --  0.84 0.96    Estimated Creatinine Clearance: 66.1 mL/min (by C-G formula based on SCr of 0.96 mg/dL).  Assessment: 53 y.o. female with h/o DVT, Coumadin on hold, continues on IV heparin while determining whether or not surgery needed for cholelithiasis, continues on IV antibiotics and bowel rest for now.  Heparin level therapeutic, CBC stable  Goal of Therapy:  Heparin level 0.3-0.7 units/ml Monitor platelets by anticoagulation protocol: Yes   Plan:  Continue heparin at 1300 units / hr Follow up AM labs  Thank you  Anette Guarneri, PharmD [phone]  05/10/2019,9:18 AM

## 2019-05-11 ENCOUNTER — Inpatient Hospital Stay (HOSPITAL_COMMUNITY): Payer: Medicare Other

## 2019-05-11 LAB — CBC
HCT: 42.9 % (ref 36.0–46.0)
Hemoglobin: 14 g/dL (ref 12.0–15.0)
MCH: 31.7 pg (ref 26.0–34.0)
MCHC: 32.6 g/dL (ref 30.0–36.0)
MCV: 97.1 fL (ref 80.0–100.0)
Platelets: 264 10*3/uL (ref 150–400)
RBC: 4.42 MIL/uL (ref 3.87–5.11)
RDW: 17.9 % — ABNORMAL HIGH (ref 11.5–15.5)
WBC: 7 10*3/uL (ref 4.0–10.5)
nRBC: 0 % (ref 0.0–0.2)

## 2019-05-11 LAB — BASIC METABOLIC PANEL
Anion gap: 15 (ref 5–15)
BUN: 19 mg/dL (ref 6–20)
CO2: 33 mmol/L — ABNORMAL HIGH (ref 22–32)
Calcium: 9.8 mg/dL (ref 8.9–10.3)
Chloride: 88 mmol/L — ABNORMAL LOW (ref 98–111)
Creatinine, Ser: 0.77 mg/dL (ref 0.44–1.00)
GFR calc Af Amer: >60 mL/min (ref >60)
GFR calc non Af Amer: >60 mL/min (ref >60)
Glucose, Bld: 183 mg/dL — ABNORMAL HIGH (ref 70–99)
Potassium: 3.1 mmol/L — ABNORMAL LOW (ref 3.5–5.1)
Sodium: 136 mmol/L (ref 135–145)

## 2019-05-11 LAB — GLUCOSE, CAPILLARY
Glucose-Capillary: 141 mg/dL — ABNORMAL HIGH (ref 70–99)
Glucose-Capillary: 316 mg/dL — ABNORMAL HIGH (ref 70–99)
Glucose-Capillary: 125 mg/dL — ABNORMAL HIGH (ref 70–99)
Glucose-Capillary: 230 mg/dL — ABNORMAL HIGH (ref 70–99)

## 2019-05-11 LAB — HEPARIN LEVEL (UNFRACTIONATED): Heparin Unfractionated: 0.48 IU/mL (ref 0.30–0.70)

## 2019-05-11 LAB — MAGNESIUM: Magnesium: 1.5 mg/dL — ABNORMAL LOW (ref 1.7–2.4)

## 2019-05-11 MED ORDER — METHOCARBAMOL 500 MG PO TABS
500.0000 mg | ORAL_TABLET | Freq: Four times a day (QID) | ORAL | Status: DC | PRN
  Administered 2019-05-11 – 2019-05-12 (×2): 500 mg via ORAL
  Filled 2019-05-11 (×2): qty 1

## 2019-05-11 MED ORDER — POTASSIUM CHLORIDE CRYS ER 20 MEQ PO TBCR
40.0000 meq | EXTENDED_RELEASE_TABLET | Freq: Once | ORAL | Status: AC
  Administered 2019-05-11: 11:00:00 40 meq via ORAL
  Filled 2019-05-11: qty 2

## 2019-05-11 MED ORDER — POTASSIUM CHLORIDE CRYS ER 20 MEQ PO TBCR
40.0000 meq | EXTENDED_RELEASE_TABLET | Freq: Once | ORAL | Status: AC
  Administered 2019-05-11: 07:00:00 40 meq via ORAL
  Filled 2019-05-11: qty 2

## 2019-05-11 MED ORDER — METOLAZONE 5 MG PO TABS
5.0000 mg | ORAL_TABLET | Freq: Once | ORAL | Status: AC
  Administered 2019-05-11: 11:00:00 5 mg via ORAL
  Filled 2019-05-11: qty 1

## 2019-05-11 NOTE — Progress Notes (Signed)
[facility] Progress Note     Subjective: CC: abdominal pain  Patient still complaining of some abdominal pain that radiates more to the back. Denies nausea except when pain not controlled. Denies worsening of pain or nausea with eating. Still having some SOB which she relates to feeling tight in her abdomen.   Objective: Vital signs in last 24 hours: Temp:  [98.4 F (36.9 C)-98.9 F (37.2 C)] 98.4 F (36.9 C) (09/04 0540) Pulse Rate:  [68-91] 73 (09/04 1106) Resp:  [16-22] 18 (09/04 0746) BP: (102-114)/(63-85) 108/77 (09/04 1106) SpO2:  [81 %-96 %] 96 % (09/04 0746) FiO2 (%):  [28 %-40 %] 40 % (09/04 0746) Weight:  [89.2 kg] 89.2 kg (09/04 0542) Last BM Date: 05/10/19  Intake/Output from previous day: 09/03 0701 - 09/04 0700 In: 1364.1 [P.O.:720; I.V.:244.3; IV Piggyback:399.9] Out: 2300 [Urine:2300] Intake/Output this shift: Total I/O In: -  Out: 500 [Urine:500]  PE: Heart: regular Lungs: trach in place on trach collar Abd: obese, soft, still somewhat tender in RUQ, +BS  Lab Results:  Recent Labs    05/10/19 0402 05/11/19 0426  WBC 8.4 7.0  HGB 12.7 14.0  HCT 39.5 42.9  PLT 274 264   BMET Recent Labs    05/10/19 0402 05/11/19 0426  NA 136 136  K 3.8 3.1*  CL 93* 88*  CO2 30 33*  GLUCOSE 177* 183*  BUN 16 19  CREATININE 0.96 0.77  CALCIUM 9.1 9.8   PT/INR No results for input(s): LABPROT, INR in the last 72 hours. CMP     Component Value Date/Time   NA 136 05/11/2019 0426   K 3.1 (L) 05/11/2019 0426   CL 88 (L) 05/11/2019 0426   CO2 33 (H) 05/11/2019 0426   GLUCOSE 183 (H) 05/11/2019 0426   BUN 19 05/11/2019 0426   CREATININE 0.77 05/11/2019 0426   CALCIUM 9.8 05/11/2019 0426   PROT 6.3 (L) 05/08/2019 0352   ALBUMIN 2.8 (L) 05/08/2019 0352   AST 14 (L) 05/08/2019 0352   ALT 13 05/08/2019 0352   ALKPHOS 100 05/08/2019 0352   BILITOT 2.0 (H) 05/08/2019 0352   GFRNONAA >60 05/11/2019 0426   GFRAA >60 05/11/2019 0426   Lipase      Component Value Date/Time   LIPASE 17 05/07/2019 1129       Studies/Results: Dg Chest 2 View  Result Date: 05/11/2019 CLINICAL DATA:  CHF. EXAM: CHEST - 2 VIEW COMPARISON:  05/07/2019 FINDINGS: Tracheostomy tube overlies the airway. Cardiomegaly and prior CABG are again noted. Bilateral interstitial type lung opacities have decreased. Trace pleural effusions are questioned. Postsurgical changes are noted in the right lung base. No pneumothorax is identified. IMPRESSION: Decreased interstitial edema. Electronically Signed   By: Logan Bores M.D.   On: 05/11/2019 08:58    Anti-infectives: Anti-infectives (From admission, onward)   Start     Dose/Rate Route Frequency Ordered Stop   05/07/19 1630  ceFEPIme (MAXIPIME) 2 g in sodium chloride 0.9 % 100 mL IVPB     2 g 200 mL/hr over 30 Minutes Intravenous Every 8 hours 05/07/19 1612     05/07/19 1600  metroNIDAZOLE (FLAGYL) IVPB 500 mg     500 mg 100 mL/hr over 60 Minutes Intravenous Every 8 hours 05/07/19 1548         Assessment/Plan COPD Chronic respiratory failure s/p trach CAD/Hx of MI s/p stents and CABD 1941 Chronic diastolic CHF Hx of DVT  Anticoagulated on coumadin- currently on heparin gtt HTN HLD IDDM  Morbid Obesity  Cholelithiasis with RUQ pain -still with some mild RUQ tenderness, states it is only slightly better than on admit.  Tolerating a low fat diet with no increase in pain. WBC normal with no fever.  Cont treat with abx, but if fails to continue to improve, may require perc chole drain. -eval from cardiology noted,and agree that patient is high risk and would like to avoid surgical intervention at this time -cont abx for now andcont with conservative management  - indications for drain placement: worsening sepsis, PO intolerance, worsening pain - nothing else really to add from a surgical standpoint at this time.   FEN:low fat diet VTE: SCDs,heparin gtt IL:[id number]  LOS: 4 days     Brigid Re , [facility] 05/11/2019, 11:44 AM Pager: [phone] Consults: [phone] 7:00 AM - 4:30 PM M, W-F 7:00 AM - 11:30 AM Tues, Sat, Sun

## 2019-05-11 NOTE — Progress Notes (Signed)
Progress Note  Patient Name: Theresa [name] Date of Encounter: 05/11/2019  Primary Cardiologist: [name], MD   Subjective   She reports feeling OK. Still having abdominal pain.  Breathing is improved but gets SOB with any activity. Coughing up some phlegm   Inpatient Medications    Scheduled Meds:  atorvastatin  20 mg Oral Daily   famotidine  20 mg Oral Daily   furosemide  80 mg Intravenous BID   gabapentin  300 mg Oral BID   gabapentin  600 mg Oral QHS   insulin aspart  0-15 Units Subcutaneous TID WC   insulin aspart  3 Units Subcutaneous TID WC   insulin glargine  33 Units Subcutaneous q morning - 10a   losartan  25 mg Oral Daily   metoprolol succinate  25 mg Oral Daily   sertraline  50 mg Oral Daily   sodium chloride flush  3 mL Intravenous Q12H   tamsulosin  0.4 mg Oral Daily   Continuous Infusions:  sodium chloride 500 mL (05/08/19 0150)   ceFEPime (MAXIPIME) IV 2 g (05/11/19 0825)   heparin 1,300 Units/hr (05/11/19 0034)   metronidazole 500 mg (05/11/19 0828)   PRN Meds: sodium chloride, acetaminophen **OR** acetaminophen, albuterol, fentaNYL (SUBLIMAZE) injection, HYDROcodone-acetaminophen, LORazepam, methocarbamol, ondansetron **OR** ondansetron (ZOFRAN) IV   Vital Signs    Vitals:   05/11/19 0358 05/11/19 0540 05/11/19 0542 05/11/19 0746  BP:  109/85  102/74  Pulse:  69  68  Resp:  18  18  Temp:  98.4 F (36.9 C)    TempSrc:  Oral    SpO2: 95% 96%  96%  Weight:   89.2 kg   Height:   4\' 10"  (1.473 m)     Intake/Output Summary (Last 24 hours) at 05/11/2019 0843 Last data filed at 05/11/2019 0006 Gross per 24 hour  Intake 1364.13 ml  Output 2300 ml  Net -935.87 ml   Filed Weights   05/08/19 0500 05/09/19 1008 05/11/19 0542  Weight: 90.9 kg 93.1 kg 89.2 kg    Telemetry    NSR - Personally Reviewed  Physical Exam   GEN: No acute distress.   Neck: No JVD, no carotid bruits Cardiac: RRR, no murmurs, rubs, or gallops.    Respiratory: +trach. Diffuse rhonchi; no wheezing or rales appreciated GI: NABS, Soft, obese, mild RUQ TTP, non-distended  MS: No pitting edema; No deformity. Neuro:  Nonfocal, moving all extremities spontaneously Psych: Normal affect   Labs    Chemistry Recent Labs  Lab 05/07/19 1129 05/08/19 0352 05/09/19 0713 05/10/19 0402 05/11/19 0426  NA 137 138 138 136 136  K 3.2* 2.7* 4.2 3.8 3.1*  CL 92* 93* 98 93* 88*  CO2 28 32 26 30 33*  GLUCOSE 302* 249* 154* 177* 183*  BUN 10 13 17 16 19   CREATININE 0.75 0.78 0.84 0.96 0.77  CALCIUM 8.8* 8.4* 8.3* 9.1 9.8  PROT 6.9 6.3*  --   --   --   ALBUMIN 3.1* 2.8*  --   --   --   AST 17 14*  --   --   --   ALT 14 13  --   --   --   ALKPHOS 117 100  --   --   --   BILITOT 2.3* 2.0*  --   --   --   GFRNONAA >60 >60 >60 >60 >60  GFRAA >60 >60 >60 >60 >60  ANIONGAP 17* 13 14 13  15  Hematology Recent Labs  Lab 05/09/19 0414 05/10/19 0402 05/11/19 0426  WBC 8.3 8.4 7.0  RBC 3.83* 4.01 4.42  HGB 12.0 12.7 14.0  HCT 38.0 39.5 42.9  MCV 99.2 98.5 97.1  MCH 31.3 31.7 31.7  MCHC 31.6 32.2 32.6  RDW 18.3* 18.2* 17.9*  PLT 269 274 264    Cardiac EnzymesNo results for input(s): TROPONINI in the last 168 hours. No results for input(s): TROPIPOC in the last 168 hours.   BNP Recent Labs  Lab 05/07/19 1129  BNP 2,264.5*     DDimer No results for input(s): DDIMER in the last 168 hours.   Radiology    No results found.  Cardiac Studies   Echocardiogram 01/2019: IMPRESSIONS   1. Severe hypokinesis of the left ventricular, basal-mid inferoseptal wall, inferior wall and inferolateral wall. 2. The left ventricle has mild-moderately reduced systolic function, with an ejection fraction of 40-45%. The cavity size was normal. Left ventricular diastolic Doppler parameters are consistent with pseudonormal. Elevated mean left atrial pressure. 3. The right ventricle has mildly reduced systolic function. The cavity was normal.  There is no increase in right ventricular wall thickness. Right ventricular systolic pressure is moderately elevated with an estimated pressure of 58 mmHg. 4. Mitral valve regurgitation is mild to moderate by color flow Doppler. The MR jet is centrally-directed. 5. The inferior vena cava was dilated in size with >50% respiratory variability. 6. Left atrial size was moderately dilated. 7. Right atrial size was mildly dilated.  Right/Left heart cath 03/2018:  Severe native coronary artery disease with total occlusion of the proximal LAD, total occlusion of the proximal RCA, and patent circumflex with patent proximal stent with eccentric 50% proximal narrowing. Distal obtuse marginal branches are severely and diffusely diseased.  Bypass graft failure with total occlusion of SVG to RCA.  Patent LIMA to LAD.  Right coronary territory is supplied by collaterals from LAD and circumflex.  Left ventricular systolic dysfunction with EF 35 to 45%. Mid to distal inferior wall akinesis. Inferobasal and inferoapical wall severely hypokinetic. Left ventricular end-diastolic pressure elevated at 19 mmHg  Moderate to severe pulmonary hypertension  RECOMMENDATIONS:   Resume Coumadin therapy overlap with heparin  Continue Plavix  Management of pulmonary status to maintain adequate oxygenation which may help decrease severity of pulmonary hypertension.   Recommend to resume Warfarin, at currently prescribed dose and frequency, on today. Recommend concurrent antiplatelet therapy of Clopidogrel 75mg  daily for Indefinite.  Patient Profile     53 y.o. female with PMH of CAD (s/p CABG '07 and STEMI PCI '17), chronic respiratory failure 2/2 COPD requiring trach ('02), lupus anticoagulant with DVTs (on warfarin) and HFmrEF (14%) with diastolic dysfunction who presents with abdominal pain, nausea, vomiting, found to have cholelithasis with suspected cholecytisis. Cardiology following for preop  assessment and optimization for surgery.   Assessment & Plan    1. Preoperative assessment: Patient is high risk for general anesthesia and surgery due to obesity, chronic respiratory failure with trach, lupus, recurrent DVTs, low functional status, CHF and CAD.  Echo 01/2019 with severe hypokinesis of LV, basal-mid inferoseptal wall, inferior wall, and inferolateral wall, with EF 40-45% and mildly reduced RV systolic function. Her last ischemic evaluation was a R/LHC in 2019 with total occlusion of pLAD, pRCA, with patent LCx s/p prox stent (50% narrowing), occlusion of SVG to RCA and patent LIMA with L>R collaterals.  No complaints of recent chest pain.  - I do not see any utility in further cardiac testing at  this time.  - will continue efforts at tuning up her CHF - continue conservative management of cholecystitis with IV antibiotics. If conditions worsen could consider percutaneous drainage.   2. Acute on chronic combined CHF:  BNP >2000. CXR with interstitial edema and trace bilateral pleural effusions. Started on IV lasix 80mg  BID with poor urine output initially. Given metolazone x 2. with improved diruesis. - I/O negative 1 liter yesterday. 3.6 liters in last 2 days - will repeat metolazone again today - Continue to monitor strict I&Os and daily weights - Continue to monitor electrolytes and replete as needed to maintain K>4, Mg >2 - CXR today reviewed personally and shows improvement in pulmonary edema but still wet  3. Electrolyte imbalance: potassium 3.1 today will replete  4. Cholecystitis: patient presented with nausea/vomiting and abdominal pain. Surgery following with plans conservative therapy vs perc chole drain.  - Continue management per surgery and primary team  For questions or updates, please contact [facility] Please consult [email or web address] for contact info under Cardiology/STEMI.      Signed, Lasaundra Riche Martin[place], MD  05/11/2019, 8:43 AM   [phone]

## 2019-05-11 NOTE — Progress Notes (Signed)
[place] for heparin Indication: hx of  DVT  Allergies  Allergen Reactions  . Penicillins Rash    Has patient had a PCN reaction causing immediate rash, facial/tongue/throat swelling, SOB or lightheadedness with hypotension: Yes Has patient had a PCN reaction causing severe rash involving mucus membranes or skin necrosis: No Has patient had a PCN reaction that required hospitalization No Has patient had a PCN reaction occurring within the last 10 years: No If all of the above answers are "NO", then may proceed with Cephalosporin use.   REACTION: rash  . Ciprofloxacin     nausea  . Ibuprofen Rash    swelling in leg     Patient Measurements: Height: 4\' 10"  (147.3 cm) Weight: 196 lb 10.4 oz (89.2 kg) IBW/kg (Calculated) : 40.9 Heparin Dosing Weight: 65.7 kg  Vital Signs: Temp: 98.4 F (36.9 C) (09/04 0540) Temp Source: Oral (09/04 0540) BP: 109/85 (09/04 0540) Pulse Rate: 69 (09/04 0540)  Labs: Recent Labs    05/09/19 0414 05/09/19 0713 05/10/19 0402 05/11/19 0426  HGB 12.0  --  12.7 14.0  HCT 38.0  --  39.5 42.9  PLT 269  --  274 264  HEPARINUNFRC 0.60  --  0.48 0.48  CREATININE  --  0.84 0.96 0.77    Estimated Creatinine Clearance: 77.3 mL/min (by C-G formula based on SCr of 0.77 mg/dL).  Assessment: 53 y.o. female with h/o DVT, Coumadin on hold, continues on IV heparin while determining whether or not surgery needed for cholelithiasis, continues on IV antibiotics and bowel rest for now.  Heparin level therapeutic (0.48), CBC stable  Goal of Therapy:  Heparin level 0.3-0.7 units/ml Monitor platelets by anticoagulation protocol: Yes   Plan:  Continue heparin at 1300 units / hr Follow up AM labs  Dawid Dupriest A. Levada Dy, PharmD, BCPS, FNKF Clinical Pharmacist [place] Please utilize Amion for appropriate phone number to reach the unit pharmacist ([place])    05/11/2019,7:08 AM

## 2019-05-11 NOTE — Progress Notes (Addendum)
PROGRESS NOTE    Theresa [name]  NLZ:[id number] DOB: 1966/08/20 DOA: 05/07/2019 PCP: Monico Blitz, MD   Brief Narrative:  HPI On 05/07/2019 by Dr. Fuller Plan Theresa [name] is a 53 y.o. female with medical history significant of CAD s/p Cabg, chronic respiratory failure due to COPD trach dependent since 2002, PEA arrest due to a dislodged trach, DVT and lupus anticoagulant on chronic Coumadin, hypertension, and insulin-dependent diabetes mellitus type 2, and morbid obesity; who presents with complaints of abdominal pain.  Pain is located in the epigastrically and in the right quadrant with radiation to her back.  Associated symptoms included nausea, vomiting, and diarrhea over the last 2 days.  Endorses increasing shortness of breath, abdominal distention, productive cough, chills, and sweats.  She was evaluated in the emergency department 2 weeks ago and noted to have cholelithiasis without cholecystitis on CT scan.  Interim history Patient admitted with right upper quadrant abdominal pain found to have cholelithiasis with presumed cholecystitis.  General surgery consulted and appreciated.  Patient also noted to have acute CHF exacerbation as well as a respiratory failure and hypoxia.  Cardiology and pulmonology consulted and following.  Patient currently diuresing. Assessment & Plan   Cholelithiasis with right upper quadrant abdominal pain, presumed cholecystitis -Right upper quadrant ultrasound showed gallstones -General surgery consulted and appreciated feels that patient clinically has acute cholecystitis.  Patient was started on IV cefepime and Flagyl -Cardiology and pulmonology consulted for preoperative assessment -Patient felt to be high risk -per surgery, if worsens, consider perc chole drain -Continues to have pain, more referred from abdomen to her back- will order Kpad, muscle relaxer  Acute on chronic systolic CHF exacerbation -Echocardiogram May 2020 showed an EF of 40 to  45% -Chest x-ray with cardiomegaly, interstitial edema -BNP 2226 -Cardiology consulted and appreciated -Patient currently on IV Lasix, given one dose of metolazone on 9/2 and 9/3 -Monitor intake and output, daily weights -UOP over past 24hrs 2.3L  -Cardiology planning for additional metolazone today along with IV lasix 80mg  BID -Continue metoprolol, statin, losartan -Currently on heparin  Acute on chronic respiratory failure with hypoxia/COPD/trach dependent -In the emergency department, patient was noted to be hypoxic and needing 8 L of oxygen.  Baseline 3 to 4 L -Suspect secondary to volume overload -As above, pulmonology consulted and appreciated -Continue pulmonary toilet, nebulizer treatments as needed  History of DVT/lupus anticoagulant -Patient with known history of left lower extremity DVT as well as lupus anticoagulant. -Has been taking Coumadin prior to admission-however stopped 2 days due to nausea and vomiting -Currently on heparin  Severe hypokalemia/hypomagnesemia -Potassium 3.1 this morning.  Replacement ordered -Pending magnesium level -Continue to monitor  Diabetes mellitus, type II -Continue Lantus, insulin sliding scale and CBG monitoring  Anxiety/depression -Continue Zoloft  Essential hypertension -BP stable, continue metoprolol, losartan  CAD/history of CABG, hyperlipidemia -Continue metoprolol, losartan, statin  Morbid obesity -BMI 41.88 -Patient will need to follow up with her PCP to discuss lifestyle modifications  DVT Prophylaxis  Heparin  Code Status: Full  Family Communication: None at bedside  Disposition Plan: Admitted. Pending improvement in fluid status and further surgical recommendations  Consultants General surgery Cardiology Pulmonology   Procedures  None  Antibiotics   Anti-infectives (From admission, onward)   Start     Dose/Rate Route Frequency Ordered Stop   05/07/19 1630  ceFEPIme (MAXIPIME) 2 g in sodium chloride  0.9 % 100 mL IVPB     2 g 200 mL/hr over 30 Minutes Intravenous Every  8 hours 05/07/19 1612     05/07/19 1600  metroNIDAZOLE (FLAGYL) IVPB 500 mg     500 mg 100 mL/hr over 60 Minutes Intravenous Every 8 hours 05/07/19 1548        Subjective:   Theresa [name] seen and examined today.  Feels abdominal pain is now referring to her back.  Wonders if a muscle relaxer would work for her.  She does feel some abdominal distention.  Currently denies chest pain, nausea or vomiting, dizziness or headache.  Feel breathing has improved, but still short of breath with minimal movement.  Objective:   Vitals:   05/11/19 0358 05/11/19 0540 05/11/19 0542 05/11/19 0746  BP:  109/85  102/74  Pulse:  69  68  Resp:  18  18  Temp:  98.4 F (36.9 C)    TempSrc:  Oral    SpO2: 95% 96%  96%  Weight:   89.2 kg   Height:   4\' 10"  (1.473 m)     Intake/Output Summary (Last 24 hours) at 05/11/2019 0911 Last data filed at 05/11/2019 0006 Gross per 24 hour  Intake 1364.13 ml  Output 1900 ml  Net -535.87 ml   Filed Weights   05/08/19 0500 05/09/19 1008 05/11/19 0542  Weight: 90.9 kg 93.1 kg 89.2 kg   Exam  General: Well developed, chronically ill-appearing, NAD  HEENT: NCAT, mucous membranes moist.   Neck: Trach  Cardiovascular: S1 S2 auscultated, RRR  Respiratory: Clear to auscultation bilaterally with equal chest rise  Abdomen: Soft, obese, RUQ TTP, nondistended, + bowel sounds  Extremities: warm dry without cyanosis clubbing or edema  Neuro: AAOx3, nonfocal  Psych: Pleasant, appropriate mood and affect  Data Reviewed: I have personally reviewed following labs and imaging studies  CBC: Recent Labs  Lab 05/07/19 1129 05/08/19 0352 05/09/19 0414 05/10/19 0402 05/11/19 0426  WBC 12.2* 10.4 8.3 8.4 7.0  HGB 13.6 12.1 12.0 12.7 14.0  HCT 41.6 36.6 38.0 39.5 42.9  MCV 97.9 97.1 99.2 98.5 97.1  PLT 333 279 269 274 527   Basic Metabolic Panel: Recent Labs  Lab 05/07/19 1129 05/08/19  0352 05/09/19 0713 05/10/19 0402 05/11/19 0426  NA 137 138 138 136 136  K 3.2* 2.7* 4.2 3.8 3.1*  CL 92* 93* 98 93* 88*  CO2 28 32 26 30 33*  GLUCOSE 302* 249* 154* 177* 183*  BUN 10 13 17 16 19   CREATININE 0.75 0.78 0.84 0.96 0.77  CALCIUM 8.8* 8.4* 8.3* 9.1 9.8  MG  --  1.5* 1.7 1.9  --    GFR: Estimated Creatinine Clearance: 77.3 mL/min (by C-G formula based on SCr of 0.77 mg/dL). Liver Function Tests: Recent Labs  Lab 05/07/19 1129 05/08/19 0352  AST 17 14*  ALT 14 13  ALKPHOS 117 100  BILITOT 2.3* 2.0*  PROT 6.9 6.3*  ALBUMIN 3.1* 2.8*   Recent Labs  Lab 05/07/19 1129  LIPASE 17   No results for input(s): AMMONIA in the last 168 hours. Coagulation Profile: Recent Labs  Lab 05/07/19 1504  INR 1.3*   Cardiac Enzymes: No results for input(s): CKTOTAL, CKMB, CKMBINDEX, TROPONINI in the last 168 hours. BNP (last 3 results) No results for input(s): PROBNP in the last 8760 hours. HbA1C: No results for input(s): HGBA1C in the last 72 hours. CBG: Recent Labs  Lab 05/10/19 0844 05/10/19 1141 05/10/19 1624 05/10/19 2146 05/11/19 0759  GLUCAP 187* 122* 158* 205* 141*   Lipid Profile: No results for input(s): CHOL, HDL, LDLCALC,  TRIG, CHOLHDL, LDLDIRECT in the last 72 hours. Thyroid Function Tests: No results for input(s): TSH, T4TOTAL, FREET4, T3FREE, THYROIDAB in the last 72 hours. Anemia Panel: No results for input(s): VITAMINB12, FOLATE, FERRITIN, TIBC, IRON, RETICCTPCT in the last 72 hours. Urine analysis:    Component Value Date/Time   COLORURINE AMBER (A) 05/08/2019 0405   APPEARANCEUR HAZY (A) 05/08/2019 0405   LABSPEC 1.040 (H) 05/08/2019 0405   PHURINE 5.0 05/08/2019 0405   GLUCOSEU 50 (A) 05/08/2019 0405   HGBUR NEGATIVE 05/08/2019 0405   BILIRUBINUR SMALL (A) 05/08/2019 0405   KETONESUR 5 (A) 05/08/2019 0405   PROTEINUR >=300 (A) 05/08/2019 0405   NITRITE NEGATIVE 05/08/2019 0405   LEUKOCYTESUR NEGATIVE 05/08/2019 0405   Sepsis Labs:  @[email or web address] (procalcitonin:4,lacticidven:4)  ) Recent Results (from the past 240 hour(s))  SARS Coronavirus 2 [facility] order, Performed in [facility] hospital lab) Nasopharyngeal Nasopharyngeal Swab     Status: None   Collection Time: 05/07/19  3:59 PM   Specimen: Nasopharyngeal Swab  Result Value Ref Range Status   SARS Coronavirus 2 NEGATIVE NEGATIVE Final    Comment: (NOTE) If result is NEGATIVE SARS-CoV-2 target nucleic acids are NOT DETECTED. The SARS-CoV-2 RNA is generally detectable in upper and lower  respiratory specimens during the acute phase of infection. The lowest  concentration of SARS-CoV-2 viral copies this assay can detect is 250  copies / mL. A negative result does not preclude SARS-CoV-2 infection  and should not be used as the sole basis for treatment or other  patient management decisions.  A negative result may occur with  improper specimen collection / handling, submission of specimen other  than nasopharyngeal swab, presence of viral mutation(s) within the  areas targeted by this assay, and inadequate number of viral copies  (<250 copies / mL). A negative result must be combined with clinical  observations, patient history, and epidemiological information. If result is POSITIVE SARS-CoV-2 target nucleic acids are DETECTED. The SARS-CoV-2 RNA is generally detectable in upper and lower  respiratory specimens dur ing the acute phase of infection.  Positive  results are indicative of active infection with SARS-CoV-2.  Clinical  correlation with patient history and other diagnostic information is  necessary to determine patient infection status.  Positive results do  not rule out bacterial infection or co-infection with other viruses. If result is PRESUMPTIVE POSTIVE SARS-CoV-2 nucleic acids MAY BE PRESENT.   A presumptive positive result was obtained on the submitted specimen  and confirmed on repeat testing.  While 2019 novel coronavirus  (SARS-CoV-2)  nucleic acids may be present in the submitted sample  additional confirmatory testing may be necessary for epidemiological  and / or clinical management purposes  to differentiate between  SARS-CoV-2 and other Sarbecovirus currently known to infect humans.  If clinically indicated additional testing with an alternate test  methodology [phone]) is advised. The SARS-CoV-2 RNA is generally  detectable in upper and lower respiratory sp ecimens during the acute  phase of infection. The expected result is Negative. Fact Sheet for Patients:  StrictlyIdeas.no Fact Sheet for Healthcare Providers: BankingDealers.co.za This test is not yet approved or cleared by the [place] FDA and has been authorized for detection and/or diagnosis of SARS-CoV-2 by FDA under an Emergency Use Authorization (EUA).  This EUA will remain in effect (meaning this test can be used) for the duration of the COVID-19 declaration under Section 564(b)(1) of the Act, 21 U.S.C. section 360bbb-3(b)(1), unless the authorization is terminated or revoked sooner. Performed at [facility]  [facility], [place] [address] [place], [place] [address]       Radiology Studies: Dg Chest 2 View  Result Date: 05/11/2019 CLINICAL DATA:  CHF. EXAM: CHEST - 2 VIEW COMPARISON:  05/07/2019 FINDINGS: Tracheostomy tube overlies the airway. Cardiomegaly and prior CABG are again noted. Bilateral interstitial type lung opacities have decreased. Trace pleural effusions are questioned. Postsurgical changes are noted in the right lung base. No pneumothorax is identified. IMPRESSION: Decreased interstitial edema. Electronically Signed   By: Logan Bores M.D.   On: 05/11/2019 08:58     Scheduled Meds: . atorvastatin  20 mg Oral Daily  . famotidine  20 mg Oral Daily  . furosemide  80 mg Intravenous BID  . gabapentin  300 mg Oral BID  . gabapentin  600 mg Oral QHS  . insulin aspart  0-15 Units Subcutaneous  TID WC  . insulin aspart  3 Units Subcutaneous TID WC  . insulin glargine  33 Units Subcutaneous q morning - 10a  . losartan  25 mg Oral Daily  . metolazone  5 mg Oral Once  . metoprolol succinate  25 mg Oral Daily  . potassium chloride  40 mEq Oral Once  . sertraline  50 mg Oral Daily  . sodium chloride flush  3 mL Intravenous Q12H  . tamsulosin  0.4 mg Oral Daily   Continuous Infusions: . sodium chloride 500 mL (05/08/19 0150)  . ceFEPime (MAXIPIME) IV 2 g (05/11/19 0825)  . heparin 1,300 Units/hr (05/11/19 0034)  . metronidazole 500 mg (05/11/19 0828)     LOS: 4 days   Time Spent in minutes   30 minutes  Demontrez Rindfleisch D.O. on 05/11/2019 at 9:11 AM  Between 7am to 7pm - Please see pager noted on [email or web address]  After 7pm go to [email or web address]  And look for the night coverage person covering for me after hours  [facility] Office  [phone]

## 2019-05-12 LAB — CBC
HCT: 42.7 % (ref 36.0–46.0)
Hemoglobin: 14.1 g/dL (ref 12.0–15.0)
MCH: 31.9 pg (ref 26.0–34.0)
MCHC: 33 g/dL (ref 30.0–36.0)
MCV: 96.6 fL (ref 80.0–100.0)
Platelets: 251 10*3/uL (ref 150–400)
RBC: 4.42 MIL/uL (ref 3.87–5.11)
RDW: 18.3 % — ABNORMAL HIGH (ref 11.5–15.5)
WBC: 8.4 10*3/uL (ref 4.0–10.5)
nRBC: 0 % (ref 0.0–0.2)

## 2019-05-12 LAB — GLUCOSE, CAPILLARY
Glucose-Capillary: 125 mg/dL — ABNORMAL HIGH (ref 70–99)
Glucose-Capillary: 221 mg/dL — ABNORMAL HIGH (ref 70–99)
Glucose-Capillary: 436 mg/dL — ABNORMAL HIGH (ref 70–99)
Glucose-Capillary: 281 mg/dL — ABNORMAL HIGH (ref 70–99)
Glucose-Capillary: 118 mg/dL — ABNORMAL HIGH (ref 70–99)

## 2019-05-12 LAB — BASIC METABOLIC PANEL
Anion gap: 12 (ref 5–15)
BUN: 21 mg/dL — ABNORMAL HIGH (ref 6–20)
CO2: 36 mmol/L — ABNORMAL HIGH (ref 22–32)
Calcium: 10.2 mg/dL (ref 8.9–10.3)
Chloride: 86 mmol/L — ABNORMAL LOW (ref 98–111)
Creatinine, Ser: 0.87 mg/dL (ref 0.44–1.00)
GFR calc Af Amer: >60 mL/min (ref >60)
GFR calc non Af Amer: >60 mL/min (ref >60)
Glucose, Bld: 161 mg/dL — ABNORMAL HIGH (ref 70–99)
Potassium: 3.9 mmol/L (ref 3.5–5.1)
Sodium: 134 mmol/L — ABNORMAL LOW (ref 135–145)

## 2019-05-12 LAB — HEPARIN LEVEL (UNFRACTIONATED): Heparin Unfractionated: 0.63 IU/mL (ref 0.30–0.70)

## 2019-05-12 MED ORDER — INSULIN ASPART 100 UNIT/ML ~~LOC~~ SOLN
0.0000 [IU] | Freq: Three times a day (TID) | SUBCUTANEOUS | Status: DC
  Administered 2019-05-12: 17:00:00 15 [IU] via SUBCUTANEOUS
  Administered 2019-05-13: 7 [IU] via SUBCUTANEOUS
  Administered 2019-05-13 (×2): 4 [IU] via SUBCUTANEOUS
  Administered 2019-05-14: 7 [IU] via SUBCUTANEOUS
  Administered 2019-05-14: 3 [IU] via SUBCUTANEOUS
  Administered 2019-05-14: 12:00:00 7 [IU] via SUBCUTANEOUS
  Administered 2019-05-15 (×2): 3 [IU] via SUBCUTANEOUS
  Administered 2019-05-16 – 2019-05-17 (×3): 4 [IU] via SUBCUTANEOUS
  Administered 2019-05-17: 13:00:00 3 [IU] via SUBCUTANEOUS

## 2019-05-12 MED ORDER — INSULIN ASPART 100 UNIT/ML ~~LOC~~ SOLN
5.0000 [IU] | Freq: Three times a day (TID) | SUBCUTANEOUS | Status: DC
  Administered 2019-05-13 – 2019-05-17 (×13): 5 [IU] via SUBCUTANEOUS

## 2019-05-12 NOTE — Progress Notes (Signed)
PROGRESS NOTE    Theresa [name]  EUM:[id number] DOB: 1966/06/08 DOA: 05/07/2019 PCP: Monico Blitz, MD   Brief Narrative:  HPI On 05/07/2019 by Dr. Fuller Plan Theresa [name] is a 53 y.o. female with medical history significant of CAD s/p Cabg, chronic respiratory failure due to COPD trach dependent since 2002, PEA arrest due to a dislodged trach, DVT and lupus anticoagulant on chronic Coumadin, hypertension, and insulin-dependent diabetes mellitus type 2, and morbid obesity; who presents with complaints of abdominal pain.  Pain is located in the epigastrically and in the right quadrant with radiation to her back.  Associated symptoms included nausea, vomiting, and diarrhea over the last 2 days.  Endorses increasing shortness of breath, abdominal distention, productive cough, chills, and sweats.  She was evaluated in the emergency department 2 weeks ago and noted to have cholelithiasis without cholecystitis on CT scan.  Interim history Patient admitted with right upper quadrant abdominal pain found to have cholelithiasis with presumed cholecystitis.  General surgery consulted and appreciated.  Patient also noted to have acute CHF exacerbation as well as a respiratory failure and hypoxia.  Cardiology and pulmonology consulted and following.  Patient currently diuresing. Assessment & Plan   Cholelithiasis with right upper quadrant abdominal pain, presumed cholecystitis -Right upper quadrant ultrasound showed gallstones -General surgery consulted and appreciated feels that patient clinically has acute cholecystitis.  Patient was started on IV cefepime and Flagyl -Cardiology and pulmonology consulted for preoperative assessment -Patient felt to be high risk -per surgery, if worsens, consider perc chole drain  Acute on chronic systolic CHF exacerbation -Echocardiogram May 2020 showed an EF of 40 to 45% -Chest x-ray with cardiomegaly, interstitial edema -BNP 2226 -Cardiology consulted and  appreciated -Patient currently on IV Lasix 80mg  BID, was given metolazone on 9/2-9/4 -Monitor intake and output, daily weights -UOP over past 24hrs 2.7L  -Continue metoprolol, statin, losartan -Currently on heparin  Acute on chronic respiratory failure with hypoxia/COPD/trach dependent -In the emergency department, patient was noted to be hypoxic and needing 8 L of oxygen.  Baseline 3 to 4 L -Suspect secondary to volume overload -As above, pulmonology consulted and appreciated -Continue pulmonary toilet, nebulizer treatments as needed  History of DVT/lupus anticoagulant -Patient with known history of left lower extremity DVT as well as lupus anticoagulant. -Has been taking Coumadin prior to admission-however stopped 2 days due to nausea and vomiting -Currently on heparin  Severe hypokalemia/hypomagnesemia -Potassium 3.9  -magnesium 1.5, will replace and monitor  -Continue to monitor  Diabetes mellitus, type II -Continue Lantus, insulin sliding scale and CBG monitoring  Anxiety/depression -Continue Zoloft  Essential hypertension -BP stable, continue metoprolol, losartan  CAD/history of CABG, hyperlipidemia -Continue metoprolol, losartan, statin  Morbid obesity -BMI 41.88 -Patient will need to follow up with her PCP to discuss lifestyle modifications  DVT Prophylaxis  Heparin  Code Status: Full  Family Communication: None at bedside  Disposition Plan: Admitted. Pending improvement in fluid status and abdominal pain  Consultants General surgery Cardiology Pulmonology   Procedures  None  Antibiotics   Anti-infectives (From admission, onward)   Start     Dose/Rate Route Frequency Ordered Stop   05/07/19 1630  ceFEPIme (MAXIPIME) 2 g in sodium chloride 0.9 % 100 mL IVPB     2 g 200 mL/hr over 30 Minutes Intravenous Every 8 hours 05/07/19 1612     05/07/19 1600  metroNIDAZOLE (FLAGYL) IVPB 500 mg     500 mg 100 mL/hr over 60 Minutes Intravenous Every 8 hours  05/07/19 1548        Subjective:   Theresa [name] seen and examined today.  Still having some abdominal pain. Feeling very sleepy. Denies current chest pain. Feels breathing has improved, but still winded with minimal exertion. Denies current nausea, vomiting, dizziness, headache.  Objective:   Vitals:   05/11/19 2301 05/12/19 0323 05/12/19 0400 05/12/19 0817  BP: 111/75 98/74 110/72 104/73  Pulse: 63 68 72 74  Resp: 15 18 13 18   Temp:   97.9 F (36.6 C)   TempSrc:   Axillary   SpO2: 96% 97% 94% 91%  Weight:   88.7 kg   Height:        Intake/Output Summary (Last 24 hours) at 05/12/2019 0915 Last data filed at 05/12/2019 0600 Gross per 24 hour  Intake 676.05 ml  Output 2700 ml  Net -2023.95 ml   Filed Weights   05/09/19 1008 05/11/19 0542 05/12/19 0400  Weight: 93.1 kg 89.2 kg 88.7 kg   Exam  General: Well developed, chronically ill appearing, NAD  HEENT: NCAT, mucous membranes moist.   Neck: +trach  Cardiovascular: S1 S2 auscultated, RRR  Respiratory: Clear to auscultation bilaterally (anteriorly)  Abdomen: Soft, RUQ TTP, nondistended, + bowel sounds  Extremities: warm dry without cyanosis clubbing or edema  Neuro: AAOx3, nonfocal  Psych: Appropriate mood and affect  Data Reviewed: I have personally reviewed following labs and imaging studies  CBC: Recent Labs  Lab 05/08/19 0352 05/09/19 0414 05/10/19 0402 05/11/19 0426 05/12/19 0503  WBC 10.4 8.3 8.4 7.0 8.4  HGB 12.1 12.0 12.7 14.0 14.1  HCT 36.6 38.0 39.5 42.9 42.7  MCV 97.1 99.2 98.5 97.1 96.6  PLT 279 269 274 264 856   Basic Metabolic Panel: Recent Labs  Lab 05/08/19 0352 05/09/19 0713 05/10/19 0402 05/11/19 0426 05/12/19 0503  NA 138 138 136 136 134*  K 2.7* 4.2 3.8 3.1* 3.9  CL 93* 98 93* 88* 86*  CO2 32 26 30 33* 36*  GLUCOSE 249* 154* 177* 183* 161*  BUN 13 17 16 19  21*  CREATININE 0.78 0.84 0.96 0.77 0.87  CALCIUM 8.4* 8.3* 9.1 9.8 10.2  MG 1.5* 1.7 1.9 1.5*  --    GFR:  Estimated Creatinine Clearance: 70.8 mL/min (by C-G formula based on SCr of 0.87 mg/dL). Liver Function Tests: Recent Labs  Lab 05/07/19 1129 05/08/19 0352  AST 17 14*  ALT 14 13  ALKPHOS 117 100  BILITOT 2.3* 2.0*  PROT 6.9 6.3*  ALBUMIN 3.1* 2.8*   Recent Labs  Lab 05/07/19 1129  LIPASE 17   No results for input(s): AMMONIA in the last 168 hours. Coagulation Profile: Recent Labs  Lab 05/07/19 1504  INR 1.3*   Cardiac Enzymes: No results for input(s): CKTOTAL, CKMB, CKMBINDEX, TROPONINI in the last 168 hours. BNP (last 3 results) No results for input(s): PROBNP in the last 8760 hours. HbA1C: No results for input(s): HGBA1C in the last 72 hours. CBG: Recent Labs  Lab 05/11/19 0759 05/11/19 1120 05/11/19 1625 05/11/19 2058 05/12/19 0802  GLUCAP 141* 316* 125* 230* 125*   Lipid Profile: No results for input(s): CHOL, HDL, LDLCALC, TRIG, CHOLHDL, LDLDIRECT in the last 72 hours. Thyroid Function Tests: No results for input(s): TSH, T4TOTAL, FREET4, T3FREE, THYROIDAB in the last 72 hours. Anemia Panel: No results for input(s): VITAMINB12, FOLATE, FERRITIN, TIBC, IRON, RETICCTPCT in the last 72 hours. Urine analysis:    Component Value Date/Time   COLORURINE AMBER (A) 05/08/2019 0405   APPEARANCEUR HAZY (A)  05/08/2019 0405   LABSPEC 1.040 (H) 05/08/2019 0405   PHURINE 5.0 05/08/2019 0405   GLUCOSEU 50 (A) 05/08/2019 0405   HGBUR NEGATIVE 05/08/2019 0405   BILIRUBINUR SMALL (A) 05/08/2019 0405   KETONESUR 5 (A) 05/08/2019 0405   PROTEINUR >=300 (A) 05/08/2019 0405   NITRITE NEGATIVE 05/08/2019 0405   LEUKOCYTESUR NEGATIVE 05/08/2019 0405   Sepsis Labs: @LABRCNTIP (procalcitonin:4,lacticidven:4)  ) Recent Results (from the past 240 hour(s))  SARS Coronavirus 2 [facility] order, Performed in [facility] hospital lab) Nasopharyngeal Nasopharyngeal Swab     Status: None   Collection Time: 05/07/19  3:59 PM   Specimen: Nasopharyngeal Swab  Result Value Ref  Range Status   SARS Coronavirus 2 NEGATIVE NEGATIVE Final    Comment: (NOTE) If result is NEGATIVE SARS-CoV-2 target nucleic acids are NOT DETECTED. The SARS-CoV-2 RNA is generally detectable in upper and lower  respiratory specimens during the acute phase of infection. The lowest  concentration of SARS-CoV-2 viral copies this assay can detect is 250  copies / mL. A negative result does not preclude SARS-CoV-2 infection  and should not be used as the sole basis for treatment or other  patient management decisions.  A negative result may occur with  improper specimen collection / handling, submission of specimen other  than nasopharyngeal swab, presence of viral mutation(s) within the  areas targeted by this assay, and inadequate number of viral copies  (<250 copies / mL). A negative result must be combined with clinical  observations, patient history, and epidemiological information. If result is POSITIVE SARS-CoV-2 target nucleic acids are DETECTED. The SARS-CoV-2 RNA is generally detectable in upper and lower  respiratory specimens dur ing the acute phase of infection.  Positive  results are indicative of active infection with SARS-CoV-2.  Clinical  correlation with patient history and other diagnostic information is  necessary to determine patient infection status.  Positive results do  not rule out bacterial infection or co-infection with other viruses. If result is PRESUMPTIVE POSTIVE SARS-CoV-2 nucleic acids MAY BE PRESENT.   A presumptive positive result was obtained on the submitted specimen  and confirmed on repeat testing.  While 2019 novel coronavirus  (SARS-CoV-2) nucleic acids may be present in the submitted sample  additional confirmatory testing may be necessary for epidemiological  and / or clinical management purposes  to differentiate between  SARS-CoV-2 and other Sarbecovirus currently known to infect humans.  If clinically indicated additional testing with an  alternate test  methodology [phone]) is advised. The SARS-CoV-2 RNA is generally  detectable in upper and lower respiratory sp ecimens during the acute  phase of infection. The expected result is Negative. Fact Sheet for Patients:  StrictlyIdeas.no Fact Sheet for Healthcare Providers: BankingDealers.co.za This test is not yet approved or cleared by the [place] FDA and has been authorized for detection and/or diagnosis of SARS-CoV-2 by FDA under an Emergency Use Authorization (EUA).  This EUA will remain in effect (meaning this test can be used) for the duration of the COVID-19 declaration under Section 564(b)(1) of the Act, 21 U.S.C. section 360bbb-3(b)(1), unless the authorization is terminated or revoked sooner. Performed at [facility], [place] [address] [place], [place] [address]       Radiology Studies: Dg Chest 2 View  Result Date: 05/11/2019 CLINICAL DATA:  CHF. EXAM: CHEST - 2 VIEW COMPARISON:  05/07/2019 FINDINGS: Tracheostomy tube overlies the airway. Cardiomegaly and prior CABG are again noted. Bilateral interstitial type lung opacities have decreased. Trace pleural effusions are  questioned. Postsurgical changes are noted in the right lung base. No pneumothorax is identified. IMPRESSION: Decreased interstitial edema. Electronically Signed   By: Logan Bores M.D.   On: 05/11/2019 08:58     Scheduled Meds: . atorvastatin  20 mg Oral Daily  . famotidine  20 mg Oral Daily  . furosemide  80 mg Intravenous BID  . gabapentin  300 mg Oral BID  . gabapentin  600 mg Oral QHS  . insulin aspart  0-15 Units Subcutaneous TID WC  . insulin aspart  3 Units Subcutaneous TID WC  . insulin glargine  33 Units Subcutaneous q morning - 10a  . losartan  25 mg Oral Daily  . metoprolol succinate  25 mg Oral Daily  . sertraline  50 mg Oral Daily  . sodium chloride flush  3 mL Intravenous Q12H  . tamsulosin  0.4 mg Oral Daily    Continuous Infusions: . sodium chloride 500 mL (05/08/19 0150)  . ceFEPime (MAXIPIME) IV 2 g (05/12/19 0024)  . heparin 1,300 Units/hr (05/12/19 0600)  . metronidazole 500 mg (05/12/19 0025)     LOS: 5 days   Time Spent in minutes   30 minutes  Heaton Sarin D.O. on 05/12/2019 at 9:15 AM  Between 7am to 7pm - Please see pager noted on [email or web address]  After 7pm go to [email or web address]  And look for the night coverage person covering for me after hours  [facility] Office  [phone]

## 2019-05-12 NOTE — Progress Notes (Signed)
Progress Note  Patient Name: Theresa [name] Date of Encounter: 05/12/2019  Primary Cardiologist: [name], MD   Subjective   No cardiac complaints   Inpatient Medications    Scheduled Meds: . atorvastatin  20 mg Oral Daily  . famotidine  20 mg Oral Daily  . furosemide  80 mg Intravenous BID  . gabapentin  300 mg Oral BID  . gabapentin  600 mg Oral QHS  . insulin aspart  0-15 Units Subcutaneous TID WC  . insulin aspart  3 Units Subcutaneous TID WC  . insulin glargine  33 Units Subcutaneous q morning - 10a  . losartan  25 mg Oral Daily  . metoprolol succinate  25 mg Oral Daily  . sertraline  50 mg Oral Daily  . sodium chloride flush  3 mL Intravenous Q12H  . tamsulosin  0.4 mg Oral Daily   Continuous Infusions: . sodium chloride 500 mL (05/08/19 0150)  . ceFEPime (MAXIPIME) IV 2 g (05/12/19 0024)  . heparin 1,300 Units/hr (05/12/19 0600)  . metronidazole 500 mg (05/12/19 0025)   PRN Meds: sodium chloride, acetaminophen **OR** acetaminophen, albuterol, fentaNYL (SUBLIMAZE) injection, HYDROcodone-acetaminophen, LORazepam, methocarbamol, ondansetron **OR** ondansetron (ZOFRAN) IV   Vital Signs    Vitals:   05/11/19 1956 05/11/19 2301 05/12/19 0323 05/12/19 0400  BP: (!) 131/118 111/75 98/74 110/72  Pulse: 80 63 68 72  Resp: 17 15 18 13   Temp: 97.7 F (36.5 C)   97.9 F (36.6 C)  TempSrc: Axillary   Axillary  SpO2: 100% 96% 97% 94%  Weight:    88.7 kg  Height:        Intake/Output Summary (Last 24 hours) at 05/12/2019 0754 Last data filed at 05/12/2019 0600 Gross per 24 hour  Intake 676.05 ml  Output 2700 ml  Net -2023.95 ml   Filed Weights   05/09/19 1008 05/11/19 0542 05/12/19 0400  Weight: 93.1 kg 89.2 kg 88.7 kg    Telemetry    NSR - Personally Reviewed  Physical Exam   GEN: No acute distress.   Neck: No JVD, no carotid bruits Cardiac: RRR, no murmurs, rubs, or gallops.  Respiratory: +trach. Diffuse rhonchi; no wheezing or rales appreciated  GI: NABS, Soft, obese, mild RUQ TTP, non-distended  MS: No pitting edema; No deformity. Neuro:  Nonfocal, moving all extremities spontaneously Psych: Normal affect   Labs    Chemistry Recent Labs  Lab 05/07/19 1129 05/08/19 0352  05/10/19 0402 05/11/19 0426 05/12/19 0503  NA 137 138   < > 136 136 134*  K 3.2* 2.7*   < > 3.8 3.1* 3.9  CL 92* 93*   < > 93* 88* 86*  CO2 28 32   < > 30 33* 36*  GLUCOSE 302* 249*   < > 177* 183* 161*  BUN 10 13   < > 16 19 21*  CREATININE 0.75 0.78   < > 0.96 0.77 0.87  CALCIUM 8.8* 8.4*   < > 9.1 9.8 10.2  PROT 6.9 6.3*  --   --   --   --   ALBUMIN 3.1* 2.8*  --   --   --   --   AST 17 14*  --   --   --   --   ALT 14 13  --   --   --   --   ALKPHOS 117 100  --   --   --   --   BILITOT 2.3* 2.0*  --   --   --   --  GFRNONAA >60 >60   < > >60 >60 >60  GFRAA >60 >60   < > >60 >60 >60  ANIONGAP 17* 13   < > 13 15 12    < > = values in this interval not displayed.     Hematology Recent Labs  Lab 05/10/19 0402 05/11/19 0426 05/12/19 0503  WBC 8.4 7.0 8.4  RBC 4.01 4.42 4.42  HGB 12.7 14.0 14.1  HCT 39.5 42.9 42.7  MCV 98.5 97.1 96.6  MCH 31.7 31.7 31.9  MCHC 32.2 32.6 33.0  RDW 18.2* 17.9* 18.3*  PLT 274 264 251    Cardiac EnzymesNo results for input(s): TROPONINI in the last 168 hours. No results for input(s): TROPIPOC in the last 168 hours.   BNP Recent Labs  Lab 05/07/19 1129  BNP 2,264.5*     DDimer No results for input(s): DDIMER in the last 168 hours.   Radiology    Dg Chest 2 View  Result Date: 05/11/2019 CLINICAL DATA:  CHF. EXAM: CHEST - 2 VIEW COMPARISON:  05/07/2019 FINDINGS: Tracheostomy tube overlies the airway. Cardiomegaly and prior CABG are again noted. Bilateral interstitial type lung opacities have decreased. Trace pleural effusions are questioned. Postsurgical changes are noted in the right lung base. No pneumothorax is identified. IMPRESSION: Decreased interstitial edema. Electronically Signed   By: Logan Bores M.D.   On: 05/11/2019 08:58    Cardiac Studies   Echocardiogram 01/2019: IMPRESSIONS   1. Severe hypokinesis of the left ventricular, basal-mid inferoseptal wall, inferior wall and inferolateral wall. 2. The left ventricle has mild-moderately reduced systolic function, with an ejection fraction of 40-45%. The cavity size was normal. Left ventricular diastolic Doppler parameters are consistent with pseudonormal. Elevated mean left atrial pressure. 3. The right ventricle has mildly reduced systolic function. The cavity was normal. There is no increase in right ventricular wall thickness. Right ventricular systolic pressure is moderately elevated with an estimated pressure of 58 mmHg. 4. Mitral valve regurgitation is mild to moderate by color flow Doppler. The MR jet is centrally-directed. 5. The inferior vena cava was dilated in size with >50% respiratory variability. 6. Left atrial size was moderately dilated. 7. Right atrial size was mildly dilated.  Right/Left heart cath 03/2018:  Severe native coronary artery disease with total occlusion of the proximal LAD, total occlusion of the proximal RCA, and patent circumflex with patent proximal stent with eccentric 50% proximal narrowing. Distal obtuse marginal branches are severely and diffusely diseased.  Bypass graft failure with total occlusion of SVG to RCA.  Patent LIMA to LAD.  Right coronary territory is supplied by collaterals from LAD and circumflex.  Left ventricular systolic dysfunction with EF 35 to 45%. Mid to distal inferior wall akinesis. Inferobasal and inferoapical wall severely hypokinetic. Left ventricular end-diastolic pressure elevated at 19 mmHg  Moderate to severe pulmonary hypertension  RECOMMENDATIONS:   Resume Coumadin therapy overlap with heparin  Continue Plavix  Management of pulmonary status to maintain adequate oxygenation which may help decrease severity of pulmonary hypertension.    Recommend to resume Warfarin, at currently prescribed dose and frequency, on today. Recommend concurrent antiplatelet therapy of Clopidogrel 75mg  daily for Indefinite.  Patient Profile     53 y.o. female with PMH of CAD (s/p CABG '07 and STEMI PCI '17), chronic respiratory failure 2/2 COPD requiring trach ('02), lupus anticoagulant with DVTs (on warfarin) and HFmrEF (74%) with diastolic dysfunction who presents with abdominal pain, nausea, vomiting, found to have cholelithasis with suspected cholecytisis. Cardiology following for  preop assessment and optimization for surgery.   Assessment & Plan    1. Preoperative assessment: Patient is high risk for general anesthesia and surgery due to obesity, chronic respiratory failure with trach, lupus, recurrent DVTs, low functional status, CHF and CAD.  Echo 01/2019 with severe hypokinesis of LV, basal-mid inferoseptal wall, inferior wall, and inferolateral wall, with EF 40-45% and mildly reduced RV systolic function. Her last ischemic evaluation was a R/LHC in 2019 with total occlusion of pLAD, pRCA, with patent LCx s/p prox stent (50% narrowing), occlusion of SVG to RCA and patent LIMA with L>R collaterals.  No complaints of recent chest pain.  - I do not see any utility in further cardiac testing at this time.  - will continue efforts at tuning up her CHF - continue conservative management of cholecystitis with IV antibiotics. If conditions worsen could consider percutaneous drainage.   2. Acute on chronic combined CHF:  BNP >2000. CXR with interstitial edema good diuresis with improved CXR yesterday continue bid iv diuresis   3. Electrolyte imbalance: K improved 3.1->3.9   4. Cholecystitis: patient presented with nausea/vomiting and abdominal pain. Surgery following with plans conservative therapy vs perc chole drain.  - Continue management per surgery and primary team  For questions or updates, please contact [facility] Please consult  [email or web address] for contact info under Cardiology/STEMI.      SignedJenkins Rouge, MD  05/12/2019, 7:54 AM   [phone]

## 2019-05-12 NOTE — Progress Notes (Signed)
[facility] for heparin Indication: hx of  DVT  Allergies  Allergen Reactions  . Penicillins Rash    Has patient had a PCN reaction causing immediate rash, facial/tongue/throat swelling, SOB or lightheadedness with hypotension: Yes Has patient had a PCN reaction causing severe rash involving mucus membranes or skin necrosis: No Has patient had a PCN reaction that required hospitalization No Has patient had a PCN reaction occurring within the last 10 years: No If all of the above answers are "NO", then may proceed with Cephalosporin use.   REACTION: rash  . Ciprofloxacin     nausea  . Ibuprofen Rash    swelling in leg     Patient Measurements: Height: 4\' 10"  (147.3 cm) Weight: 195 lb 8 oz (88.7 kg) IBW/kg (Calculated) : 40.9 Heparin Dosing Weight: 65.7 kg  Vital Signs: Temp: 97.9 F (36.6 C) (09/05 0400) Temp Source: Axillary (09/05 0400) BP: 110/72 (09/05 0400) Pulse Rate: 72 (09/05 0400)  Labs: Recent Labs    05/10/19 0402 05/11/19 0426 05/12/19 0503  HGB 12.7 14.0 14.1  HCT 39.5 42.9 42.7  PLT 274 264 251  HEPARINUNFRC 0.48 0.48 0.63  CREATININE 0.96 0.77 0.87    Estimated Creatinine Clearance: 70.8 mL/min (by C-G formula based on SCr of 0.87 mg/dL).  Assessment: 53 y.o. female with h/o DVT, Coumadin on hold, continues on IV heparin while determining whether or not surgery needed for cholelithiasis, continues on IV antibiotics and bowel rest for now.   Per surgery, pt still has some RUQ tenderness but it is better than on admission but still may require perc chole drain if they do not continue to improve  Heparin level therapeutic (0.63), CBC stable, no s/sx of bleeding  Goal of Therapy:  Heparin level 0.3-0.7 units/ml Monitor platelets by anticoagulation protocol: Yes   Plan:  Continue heparin at 1300 units/hr Monitor daily heparin level, CBC, and s/sx of bleeding F/u plan to switch back to oral  anticoagulation  Kennon Holter, PharmD PGY1 Ambulatory Care Pharmacy Resident [employer] Phone: [phone] 05/12/2019,7:18 AM

## 2019-05-13 LAB — BASIC METABOLIC PANEL
Anion gap: 12 (ref 5–15)
BUN: 24 mg/dL — ABNORMAL HIGH (ref 6–20)
CO2: 36 mmol/L — ABNORMAL HIGH (ref 22–32)
Calcium: 9.4 mg/dL (ref 8.9–10.3)
Chloride: 87 mmol/L — ABNORMAL LOW (ref 98–111)
Creatinine, Ser: 1.03 mg/dL — ABNORMAL HIGH (ref 0.44–1.00)
GFR calc Af Amer: >60 mL/min (ref >60)
GFR calc non Af Amer: >60 mL/min (ref >60)
Glucose, Bld: 209 mg/dL — ABNORMAL HIGH (ref 70–99)
Potassium: 4 mmol/L (ref 3.5–5.1)
Sodium: 135 mmol/L (ref 135–145)

## 2019-05-13 LAB — CBC
HCT: 44.1 % (ref 36.0–46.0)
Hemoglobin: 14.5 g/dL (ref 12.0–15.0)
MCH: 31.9 pg (ref 26.0–34.0)
MCHC: 32.9 g/dL (ref 30.0–36.0)
MCV: 96.9 fL (ref 80.0–100.0)
Platelets: 250 10*3/uL (ref 150–400)
RBC: 4.55 MIL/uL (ref 3.87–5.11)
RDW: 18.2 % — ABNORMAL HIGH (ref 11.5–15.5)
WBC: 9.4 10*3/uL (ref 4.0–10.5)
nRBC: 0 % (ref 0.0–0.2)

## 2019-05-13 LAB — GLUCOSE, CAPILLARY
Glucose-Capillary: 200 mg/dL — ABNORMAL HIGH (ref 70–99)
Glucose-Capillary: 177 mg/dL — ABNORMAL HIGH (ref 70–99)
Glucose-Capillary: 204 mg/dL — ABNORMAL HIGH (ref 70–99)
Glucose-Capillary: 123 mg/dL — ABNORMAL HIGH (ref 70–99)

## 2019-05-13 LAB — HEPARIN LEVEL (UNFRACTIONATED): Heparin Unfractionated: 0.66 IU/mL (ref 0.30–0.70)

## 2019-05-13 LAB — MAGNESIUM: Magnesium: 1.6 mg/dL — ABNORMAL LOW (ref 1.7–2.4)

## 2019-05-13 MED ORDER — MORPHINE SULFATE (PF) 2 MG/ML IV SOLN: 2.0000 mg | Freq: Four times a day (QID) | INTRAVENOUS | Status: DC | PRN

## 2019-05-13 MED ORDER — WARFARIN - PHARMACIST DOSING INPATIENT
Freq: Every day | Status: DC
  Administered 2019-05-13 – 2019-05-16 (×3)

## 2019-05-13 MED ORDER — WARFARIN SODIUM 4 MG PO TABS
4.0000 mg | ORAL_TABLET | Freq: Once | ORAL | Status: AC
  Administered 2019-05-13: 4 mg via ORAL
  Filled 2019-05-13: qty 1

## 2019-05-13 MED ORDER — SODIUM CHLORIDE 0.9 % IV BOLUS: 250.0000 mL | Freq: Once | INTRAVENOUS | Status: AC

## 2019-05-13 MED ORDER — SODIUM CHLORIDE 0.9 % IV BOLUS
250.0000 mL | Freq: Once | INTRAVENOUS | Status: AC
  Administered 2019-05-13: 17:00:00 250 mL via INTRAVENOUS

## 2019-05-13 NOTE — Progress Notes (Signed)
Progress Note  Patient Name: Theresa [name] Date of Encounter: 05/13/2019  Primary Cardiologist: [name], MD   Subjective   No cardiac complaints   Inpatient Medications    Scheduled Meds: . atorvastatin  20 mg Oral Daily  . famotidine  20 mg Oral Daily  . furosemide  80 mg Intravenous BID  . gabapentin  300 mg Oral BID  . gabapentin  600 mg Oral QHS  . insulin aspart  0-20 Units Subcutaneous TID WC  . insulin aspart  5 Units Subcutaneous TID WC  . insulin glargine  33 Units Subcutaneous q morning - 10a  . losartan  25 mg Oral Daily  . metoprolol succinate  25 mg Oral Daily  . sertraline  50 mg Oral Daily  . sodium chloride flush  3 mL Intravenous Q12H  . tamsulosin  0.4 mg Oral Daily   Continuous Infusions: . sodium chloride 500 mL (05/08/19 0150)  . ceFEPime (MAXIPIME) IV 2 g (05/13/19 0000)  . heparin 1,300 Units/hr (05/13/19 0400)  . metronidazole 500 mg (05/12/19 2353)   PRN Meds: sodium chloride, acetaminophen **OR** acetaminophen, albuterol, fentaNYL (SUBLIMAZE) injection, HYDROcodone-acetaminophen, LORazepam, methocarbamol, ondansetron **OR** ondansetron (ZOFRAN) IV   Vital Signs    Vitals:   05/12/19 2110 05/13/19 0002 05/13/19 0346 05/13/19 0423  BP: (!) 145/81   119/73  Pulse: 78 66 65 69  Resp:  20 20 20   Temp: 99.3 F (37.4 C)   98 F (36.7 C)  TempSrc: Oral   Oral  SpO2: 93% 93% 94% 91%  Weight:    88.7 kg  Height:        Intake/Output Summary (Last 24 hours) at 05/13/2019 0805 Last data filed at 05/13/2019 3474 Gross per 24 hour  Intake 1205.21 ml  Output 2500 ml  Net -1294.79 ml   Filed Weights   05/11/19 0542 05/12/19 0400 05/13/19 0423  Weight: 89.2 kg 88.7 kg 88.7 kg    Telemetry    NSR - Personally Reviewed  Physical Exam   GEN: No acute distress.   Neck: No JVD, no carotid bruits Cardiac: RRR, no murmurs, rubs, or gallops.  Respiratory: +trach. Diffuse rhonchi; no wheezing or rales appreciated GI: NABS, Soft, obese,  mild RUQ TTP, non-distended  MS: No pitting edema; No deformity. Neuro:  Nonfocal, moving all extremities spontaneously Psych: Normal affect   Labs    Chemistry Recent Labs  Lab 05/07/19 1129 05/08/19 0352  05/11/19 0426 05/12/19 0503 05/13/19 0555  NA 137 138   < > 136 134* 135  K 3.2* 2.7*   < > 3.1* 3.9 4.0  CL 92* 93*   < > 88* 86* 87*  CO2 28 32   < > 33* 36* 36*  GLUCOSE 302* 249*   < > 183* 161* 209*  BUN 10 13   < > 19 21* 24*  CREATININE 0.75 0.78   < > 0.77 0.87 1.03*  CALCIUM 8.8* 8.4*   < > 9.8 10.2 9.4  PROT 6.9 6.3*  --   --   --   --   ALBUMIN 3.1* 2.8*  --   --   --   --   AST 17 14*  --   --   --   --   ALT 14 13  --   --   --   --   ALKPHOS 117 100  --   --   --   --   BILITOT 2.3* 2.0*  --   --   --   --  GFRNONAA >60 >60   < > >60 >60 >60  GFRAA >60 >60   < > >60 >60 >60  ANIONGAP 17* 13   < > 15 12 12    < > = values in this interval not displayed.     Hematology Recent Labs  Lab 05/11/19 0426 05/12/19 0503 05/13/19 0555  WBC 7.0 8.4 9.4  RBC 4.42 4.42 4.55  HGB 14.0 14.1 14.5  HCT 42.9 42.7 44.1  MCV 97.1 96.6 96.9  MCH 31.7 31.9 31.9  MCHC 32.6 33.0 32.9  RDW 17.9* 18.3* 18.2*  PLT 264 251 250    Cardiac EnzymesNo results for input(s): TROPONINI in the last 168 hours. No results for input(s): TROPIPOC in the last 168 hours.   BNP Recent Labs  Lab 05/07/19 1129  BNP 2,264.5*     DDimer No results for input(s): DDIMER in the last 168 hours.   Radiology    No results found.  Cardiac Studies   Echocardiogram 01/2019: IMPRESSIONS   1. Severe hypokinesis of the left ventricular, basal-mid inferoseptal wall, inferior wall and inferolateral wall. 2. The left ventricle has mild-moderately reduced systolic function, with an ejection fraction of 40-45%. The cavity size was normal. Left ventricular diastolic Doppler parameters are consistent with pseudonormal. Elevated mean left atrial pressure. 3. The right ventricle has  mildly reduced systolic function. The cavity was normal. There is no increase in right ventricular wall thickness. Right ventricular systolic pressure is moderately elevated with an estimated pressure of 58 mmHg. 4. Mitral valve regurgitation is mild to moderate by color flow Doppler. The MR jet is centrally-directed. 5. The inferior vena cava was dilated in size with >50% respiratory variability. 6. Left atrial size was moderately dilated. 7. Right atrial size was mildly dilated.  Right/Left heart cath 03/2018:  Severe native coronary artery disease with total occlusion of the proximal LAD, total occlusion of the proximal RCA, and patent circumflex with patent proximal stent with eccentric 50% proximal narrowing. Distal obtuse marginal branches are severely and diffusely diseased.  Bypass graft failure with total occlusion of SVG to RCA.  Patent LIMA to LAD.  Right coronary territory is supplied by collaterals from LAD and circumflex.  Left ventricular systolic dysfunction with EF 35 to 45%. Mid to distal inferior wall akinesis. Inferobasal and inferoapical wall severely hypokinetic. Left ventricular end-diastolic pressure elevated at 19 mmHg  Moderate to severe pulmonary hypertension  RECOMMENDATIONS:   Resume Coumadin therapy overlap with heparin  Continue Plavix  Management of pulmonary status to maintain adequate oxygenation which may help decrease severity of pulmonary hypertension.   Recommend to resume Warfarin, at currently prescribed dose and frequency, on today. Recommend concurrent antiplatelet therapy of Clopidogrel 75mg  daily for Indefinite.  Patient Profile     53 y.o. female with PMH of CAD (s/p CABG '07 and STEMI PCI '17), chronic respiratory failure 2/2 COPD requiring trach ('02), lupus anticoagulant with DVTs (on warfarin) and HFmrEF (56%) with diastolic dysfunction who presents with abdominal pain, nausea, vomiting, found to have cholelithasis with  suspected cholecytisis. Cardiology following for preop assessment and optimization for surgery.   Assessment & Plan    1. Preoperative assessment: High risk for open surgery  - continue conservative management of cholecystitis with IV antibiotics. If conditions worsen could consider percutaneous drainage.   2. Acute on chronic combined CHF:  BNP >2000. CXR with interstitial edema good diuresis with improved CXR 9/4 continue bid diuretic change to PO   3. Electrolyte imbalance: K improved 3.1->4.0 today  4. Cholecystitis:  Conservative therapy vs perc chole drain.  - Continue management per surgery and primary team  5. Anticoagulation:  Change back over to oral coumadin hypercoagulable   For questions or updates, please contact [place] Please consult [email or web address] for contact info under Cardiology/STEMI.      SignedJenkins Rouge, MD  05/13/2019, 8:05 AM   [phone]

## 2019-05-13 NOTE — Procedures (Signed)
Tracheostomy Change Note  Patient Details:   Name: Theresa [name] DOB: Jul 07, 1966 MRN: [id number]    Airway Documentation:     Evaluation  O2 sats: stable throughout Complications: No apparent complications Patient did tolerate procedure well. Bilateral Breath Sounds: Clear   Patient trach changed from #4 cuffless shiley to another #4 cuffless shiley per MD order.  Positive color change noted.  Bilateral breath sounds auscultated.  Sats at 100%.  Vitals are stable.  Patient tolerated well.  No complications noted.   Judith Part 05/13/2019, 11:06 AM

## 2019-05-13 NOTE — Progress Notes (Signed)
BP 84/61 HR 77 while lying in bed.  Denied dizziness; however, stated she was a little bit dizzy while sitting up.   O2 sat 92%.  On call cardiology made aware.  Idolina Primer, RN

## 2019-05-13 NOTE — Progress Notes (Signed)
Called secondary to low B/P 84/60. Will hold Cozaar and Lasix- 250 cc NS bolus now x 1.  Reassess in am.   Kerin Ransom PA-C 05/13/2019 4:56 PM

## 2019-05-13 NOTE — Progress Notes (Signed)
PROGRESS NOTE    Theresa [name]  UKG:[id number] DOB: 04-07-1966 DOA: 05/07/2019 PCP: Monico Blitz, MD   Brief Narrative:  HPI On 05/07/2019 by Dr. Fuller Plan Theresa [name] is a 53 y.o. female with medical history significant of CAD s/p Cabg, chronic respiratory failure due to COPD trach dependent since 2002, PEA arrest due to a dislodged trach, DVT and lupus anticoagulant on chronic Coumadin, hypertension, and insulin-dependent diabetes mellitus type 2, and morbid obesity; who presents with complaints of abdominal pain.  Pain is located in the epigastrically and in the right quadrant with radiation to her back.  Associated symptoms included nausea, vomiting, and diarrhea over the last 2 days.  Endorses increasing shortness of breath, abdominal distention, productive cough, chills, and sweats.  She was evaluated in the emergency department 2 weeks ago and noted to have cholelithiasis without cholecystitis on CT scan.  Interim history Patient admitted with right upper quadrant abdominal pain found to have cholelithiasis with presumed cholecystitis.  General surgery consulted and appreciated.  Patient also noted to have acute CHF exacerbation as well as a respiratory failure and hypoxia.  Cardiology and pulmonology consulted and following.  Patient currently diuresing. Assessment & Plan   Cholelithiasis with right upper quadrant abdominal pain, presumed cholecystitis -Right upper quadrant ultrasound showed gallstones -General surgery consulted and appreciated feels that patient clinically has acute cholecystitis.  Patient was started on IV cefepime and Flagyl -Cardiology and pulmonology consulted for preoperative assessment -Patient felt to be high risk -per surgery, if worsens, consider perc chole drain -currently able to tolerate diet   Acute on chronic systolic CHF exacerbation -Echocardiogram May 2020 showed an EF of 40 to 45% -Chest x-ray with cardiomegaly, interstitial edema -BNP  2226 -Cardiology consulted and appreciated -Patient currently on IV Lasix 80mg  BID, was given metolazone on 9/2-9/4 -Monitor intake and output, daily weights -UOP over past 24hrs 2.5L  -Continue metoprolol, statin, losartan -Currently on heparin  Acute on chronic respiratory failure with hypoxia/COPD/trach dependent -In the emergency department, patient was noted to be hypoxic and needing 8 L of oxygen.  Baseline 3 to 4 L -Suspect secondary to volume overload -As above, pulmonology consulted and appreciated -Continue pulmonary toilet, nebulizer treatments as needed  History of DVT/lupus anticoagulant -Patient with known history of left lower extremity DVT as well as lupus anticoagulant. -Has been taking Coumadin prior to admission-however stopped 2 days due to nausea and vomiting -Currently on heparin -will restart coumadin tonight  Severe hypokalemia/hypomagnesemia -Potassium 4  -pending magnesium level today -Continue to monitor  Diabetes mellitus, type II -Continue Lantus, insulin sliding scale and CBG monitoring  Anxiety/depression -Continue Zoloft  Essential hypertension -BP stable, continue metoprolol, losartan  CAD/history of CABG, hyperlipidemia -Continue metoprolol, losartan, statin  Morbid obesity -BMI 40.86 -Patient will need to follow up with her PCP to discuss lifestyle modifications  DVT Prophylaxis  Heparin  Code Status: Full  Family Communication: None at bedside  Disposition Plan: Admitted. Pending improvement in fluid status and abdominal pain  Consultants General surgery Cardiology Pulmonology   Procedures  None  Antibiotics   Anti-infectives (From admission, onward)   Start     Dose/Rate Route Frequency Ordered Stop   05/07/19 1630  ceFEPIme (MAXIPIME) 2 g in sodium chloride 0.9 % 100 mL IVPB     2 g 200 mL/hr over 30 Minutes Intravenous Every 8 hours 05/07/19 1612     05/07/19 1600  metroNIDAZOLE (FLAGYL) IVPB 500 mg     500 mg 100  mL/hr  over 60 Minutes Intravenous Every 8 hours 05/07/19 1548        Subjective:   Theresa [name] seen and examined today.  States it is too early to tell how she feels. She does feel hungry. Feels abdominal pain has improved. Denies current chest pain. Feels breathing has improved, but has not walked yet. Denies current nausea, vomiting, diarrhea, constipation, dizziness, headache. Feels her leg swelling has improved.  Objective:   Vitals:   05/13/19 0002 05/13/19 0346 05/13/19 0423 05/13/19 0813  BP:   119/73   Pulse: 66 65 69 67  Resp: 20 20 20  (!) 22  Temp:   98 F (36.7 C)   TempSrc:   Oral   SpO2: 93% 94% 91% 92%  Weight:   88.7 kg   Height:        Intake/Output Summary (Last 24 hours) at 05/13/2019 0910 Last data filed at 05/13/2019 9357 Gross per 24 hour  Intake 1205.21 ml  Output 2500 ml  Net -1294.79 ml   Filed Weights   05/11/19 0542 05/12/19 0400 05/13/19 0423  Weight: 89.2 kg 88.7 kg 88.7 kg   Exam  General: Well developed, chronically ill appearing, NAD  HEENT: NCAT, mucous membranes moist.   Neck: Trach  Cardiovascular: S1 S2 auscultated, RRR, no murmur  Respiratory: Clear to auscultation bilaterally anteriorly   Abdomen: Soft, obese, mild RUQ TTP, nondistended, + bowel sounds  Extremities: warm dry without cyanosis clubbing or edema  Neuro: AAOx3, nonfocal  Psych: Pleasant, appropriate mood and affect  Data Reviewed: I have personally reviewed following labs and imaging studies  CBC: Recent Labs  Lab 05/09/19 0414 05/10/19 0402 05/11/19 0426 05/12/19 0503 05/13/19 0555  WBC 8.3 8.4 7.0 8.4 9.4  HGB 12.0 12.7 14.0 14.1 14.5  HCT 38.0 39.5 42.9 42.7 44.1  MCV 99.2 98.5 97.1 96.6 96.9  PLT 269 274 264 251 017   Basic Metabolic Panel: Recent Labs  Lab 05/08/19 0352 05/09/19 0713 05/10/19 0402 05/11/19 0426 05/12/19 0503 05/13/19 0555  NA 138 138 136 136 134* 135  K 2.7* 4.2 3.8 3.1* 3.9 4.0  CL 93* 98 93* 88* 86* 87*  CO2 32 26  30 33* 36* 36*  GLUCOSE 249* 154* 177* 183* 161* 209*  BUN 13 17 16 19  21* 24*  CREATININE 0.78 0.84 0.96 0.77 0.87 1.03*  CALCIUM 8.4* 8.3* 9.1 9.8 10.2 9.4  MG 1.5* 1.7 1.9 1.5*  --   --    GFR: Estimated Creatinine Clearance: 59.8 mL/min (A) (by C-G formula based on SCr of 1.03 mg/dL (H)). Liver Function Tests: Recent Labs  Lab 05/07/19 1129 05/08/19 0352  AST 17 14*  ALT 14 13  ALKPHOS 117 100  BILITOT 2.3* 2.0*  PROT 6.9 6.3*  ALBUMIN 3.1* 2.8*   Recent Labs  Lab 05/07/19 1129  LIPASE 17   No results for input(s): AMMONIA in the last 168 hours. Coagulation Profile: Recent Labs  Lab 05/07/19 1504  INR 1.3*   Cardiac Enzymes: No results for input(s): CKTOTAL, CKMB, CKMBINDEX, TROPONINI in the last 168 hours. BNP (last 3 results) No results for input(s): PROBNP in the last 8760 hours. HbA1C: No results for input(s): HGBA1C in the last 72 hours. CBG: Recent Labs  Lab 05/12/19 1203 05/12/19 1622 05/12/19 1853 05/12/19 2116 05/13/19 0814  GLUCAP 221* 436* 281* 118* 200*   Lipid Profile: No results for input(s): CHOL, HDL, LDLCALC, TRIG, CHOLHDL, LDLDIRECT in the last 72 hours. Thyroid Function Tests: No results for input(s):  TSH, T4TOTAL, FREET4, T3FREE, THYROIDAB in the last 72 hours. Anemia Panel: No results for input(s): VITAMINB12, FOLATE, FERRITIN, TIBC, IRON, RETICCTPCT in the last 72 hours. Urine analysis:    Component Value Date/Time   COLORURINE AMBER (A) 05/08/2019 0405   APPEARANCEUR HAZY (A) 05/08/2019 0405   LABSPEC 1.040 (H) 05/08/2019 0405   PHURINE 5.0 05/08/2019 0405   GLUCOSEU 50 (A) 05/08/2019 0405   HGBUR NEGATIVE 05/08/2019 0405   BILIRUBINUR SMALL (A) 05/08/2019 0405   KETONESUR 5 (A) 05/08/2019 0405   PROTEINUR >=300 (A) 05/08/2019 0405   NITRITE NEGATIVE 05/08/2019 0405   LEUKOCYTESUR NEGATIVE 05/08/2019 0405   Sepsis Labs: @LABRCNTIP (procalcitonin:4,lacticidven:4)  ) Recent Results (from the past 240 hour(s))  SARS  Coronavirus 2 [facility] order, Performed in [facility] hospital lab) Nasopharyngeal Nasopharyngeal Swab     Status: None   Collection Time: 05/07/19  3:59 PM   Specimen: Nasopharyngeal Swab  Result Value Ref Range Status   SARS Coronavirus 2 NEGATIVE NEGATIVE Final    Comment: (NOTE) If result is NEGATIVE SARS-CoV-2 target nucleic acids are NOT DETECTED. The SARS-CoV-2 RNA is generally detectable in upper and lower  respiratory specimens during the acute phase of infection. The lowest  concentration of SARS-CoV-2 viral copies this assay can detect is 250  copies / mL. A negative result does not preclude SARS-CoV-2 infection  and should not be used as the sole basis for treatment or other  patient management decisions.  A negative result may occur with  improper specimen collection / handling, submission of specimen other  than nasopharyngeal swab, presence of viral mutation(s) within the  areas targeted by this assay, and inadequate number of viral copies  (<250 copies / mL). A negative result must be combined with clinical  observations, patient history, and epidemiological information. If result is POSITIVE SARS-CoV-2 target nucleic acids are DETECTED. The SARS-CoV-2 RNA is generally detectable in upper and lower  respiratory specimens dur ing the acute phase of infection.  Positive  results are indicative of active infection with SARS-CoV-2.  Clinical  correlation with patient history and other diagnostic information is  necessary to determine patient infection status.  Positive results do  not rule out bacterial infection or co-infection with other viruses. If result is PRESUMPTIVE POSTIVE SARS-CoV-2 nucleic acids MAY BE PRESENT.   A presumptive positive result was obtained on the submitted specimen  and confirmed on repeat testing.  While 2019 novel coronavirus  (SARS-CoV-2) nucleic acids may be present in the submitted sample  additional confirmatory testing may be necessary  for epidemiological  and / or clinical management purposes  to differentiate between  SARS-CoV-2 and other Sarbecovirus currently known to infect humans.  If clinically indicated additional testing with an alternate test  methodology [phone]) is advised. The SARS-CoV-2 RNA is generally  detectable in upper and lower respiratory sp ecimens during the acute  phase of infection. The expected result is Negative. Fact Sheet for Patients:  StrictlyIdeas.no Fact Sheet for Healthcare Providers: BankingDealers.co.za This test is not yet approved or cleared by the [place] FDA and has been authorized for detection and/or diagnosis of SARS-CoV-2 by FDA under an Emergency Use Authorization (EUA).  This EUA will remain in effect (meaning this test can be used) for the duration of the COVID-19 declaration under Section 564(b)(1) of the Act, 21 U.S.C. section 360bbb-3(b)(1), unless the authorization is terminated or revoked sooner. Performed at [facility], [place] [address] [place], [place] [address]  Radiology Studies: No results found.   Scheduled Meds: . atorvastatin  20 mg Oral Daily  . famotidine  20 mg Oral Daily  . furosemide  80 mg Intravenous BID  . gabapentin  300 mg Oral BID  . gabapentin  600 mg Oral QHS  . insulin aspart  0-20 Units Subcutaneous TID WC  . insulin aspart  5 Units Subcutaneous TID WC  . insulin glargine  33 Units Subcutaneous q morning - 10a  . losartan  25 mg Oral Daily  . metoprolol succinate  25 mg Oral Daily  . sertraline  50 mg Oral Daily  . sodium chloride flush  3 mL Intravenous Q12H  . tamsulosin  0.4 mg Oral Daily   Continuous Infusions: . sodium chloride 500 mL (05/08/19 0150)  . ceFEPime (MAXIPIME) IV 2 g (05/13/19 0857)  . heparin 1,300 Units/hr (05/13/19 0400)  . metronidazole 500 mg (05/13/19 0842)     LOS: 6 days   Time Spent in minutes   30 minutes  Ramatoulaye Pack D.O.  on 05/13/2019 at 9:10 AM  Between 7am to 7pm - Please see pager noted on [email or web address]  After 7pm go to [email or web address]  And look for the night coverage person covering for me after hours  [facility] Office  [phone]

## 2019-05-13 NOTE — Progress Notes (Signed)
[place] for heparin + Coumadin Indication: hx of DVT  Allergies  Allergen Reactions  . Penicillins Rash    Has patient had a PCN reaction causing immediate rash, facial/tongue/throat swelling, SOB or lightheadedness with hypotension: Yes Has patient had a PCN reaction causing severe rash involving mucus membranes or skin necrosis: No Has patient had a PCN reaction that required hospitalization No Has patient had a PCN reaction occurring within the last 10 years: No If all of the above answers are "NO", then may proceed with Cephalosporin use.   REACTION: rash  . Ciprofloxacin     nausea  . Ibuprofen Rash    swelling in leg     Patient Measurements: Height: 4\' 10"  (147.3 cm) Weight: 195 lb 8 oz (88.7 kg) IBW/kg (Calculated) : 40.9 Heparin Dosing Weight: 65.7 kg  Vital Signs: Temp: 98 F (36.7 C) (09/06 0423) Temp Source: Oral (09/06 0423) BP: 119/73 (09/06 0423) Pulse Rate: 69 (09/06 0423)  Labs: Recent Labs    05/11/19 0426 05/12/19 0503 05/13/19 0555  HGB 14.0 14.1 14.5  HCT 42.9 42.7 44.1  PLT 264 251 250  HEPARINUNFRC 0.48 0.63 0.66  CREATININE 0.77 0.87  --     Estimated Creatinine Clearance: 70.8 mL/min (by C-G formula based on SCr of 0.87 mg/dL).  Assessment: 53 y.o. female with h/o DVT, Coumadin on hold, continues on IV heparin while determining whether or not surgery needed for cholelithiasis, continues on IV antibiotics and bowel rest for now. Per surgery, pt still has some RUQ tenderness but it is better than on admission but still may require perc chole drain if they do not continue to improve.  Heparin level therapeutic (0.66), CBC stable, no s/sx of bleeding  9/6 received consult to start bridge back to warfarin. PTA pt was taking Coumadin 2.5mg  daily. Last dose was 8/29 @1000 . Will start out at 1.5x pt's home dose to help them get to goal INR 2-3. Plan to overlap therapeutic heparin dose with warfarin for at  least 5 days and until INR is in therapeutic range >24 hrs.  Goal of Therapy:  Heparin level 0.3-0.7 units/ml Monitor platelets by anticoagulation protocol: Yes  INR level: 2-3   Plan:  Continue heparin at 1300 units/hr -Monitor daily heparin level, CBC, and s/sx of bleeding Initiate Coumadin 4mg  today; will dose daily based on INR -Monitor daily PT/INR  Kennon Holter, PharmD PGY1 Ambulatory Care Pharmacy Resident [employer] Phone: [phone] 05/13/2019,7:23 AM

## 2019-05-14 DIAGNOSIS — E876 Hypokalemia: Secondary | ICD-10-CM

## 2019-05-14 DIAGNOSIS — I10 Essential (primary) hypertension: Secondary | ICD-10-CM

## 2019-05-14 LAB — CBC
HCT: 41 % (ref 36.0–46.0)
Hemoglobin: 13.7 g/dL (ref 12.0–15.0)
MCH: 32.2 pg (ref 26.0–34.0)
MCHC: 33.4 g/dL (ref 30.0–36.0)
MCV: 96.5 fL (ref 80.0–100.0)
Platelets: 244 10*3/uL (ref 150–400)
RBC: 4.25 MIL/uL (ref 3.87–5.11)
RDW: 18 % — ABNORMAL HIGH (ref 11.5–15.5)
WBC: 8.4 10*3/uL (ref 4.0–10.5)
nRBC: 0 % (ref 0.0–0.2)

## 2019-05-14 LAB — HEPARIN LEVEL (UNFRACTIONATED)
Heparin Unfractionated: 0.75 IU/mL — ABNORMAL HIGH (ref 0.30–0.70)
Heparin Unfractionated: 0.95 IU/mL — ABNORMAL HIGH (ref 0.30–0.70)
Heparin Unfractionated: 0.87 IU/mL — ABNORMAL HIGH (ref 0.30–0.70)
Heparin Unfractionated: 0.59 IU/mL (ref 0.30–0.70)

## 2019-05-14 LAB — PROTIME-INR
INR: 1.1 (ref 0.8–1.2)
Prothrombin Time: 13.9 seconds (ref 11.4–15.2)

## 2019-05-14 LAB — BASIC METABOLIC PANEL
Anion gap: 12 (ref 5–15)
BUN: 26 mg/dL — ABNORMAL HIGH (ref 6–20)
CO2: 32 mmol/L (ref 22–32)
Calcium: 9 mg/dL (ref 8.9–10.3)
Chloride: 91 mmol/L — ABNORMAL LOW (ref 98–111)
Creatinine, Ser: 1.12 mg/dL — ABNORMAL HIGH (ref 0.44–1.00)
GFR calc Af Amer: >60 mL/min (ref >60)
GFR calc non Af Amer: 56 mL/min — ABNORMAL LOW (ref >60)
Glucose, Bld: 206 mg/dL — ABNORMAL HIGH (ref 70–99)
Potassium: 3.5 mmol/L (ref 3.5–5.1)
Sodium: 135 mmol/L (ref 135–145)

## 2019-05-14 LAB — MAGNESIUM: Magnesium: 1.8 mg/dL (ref 1.7–2.4)

## 2019-05-14 LAB — GLUCOSE, CAPILLARY
Glucose-Capillary: 125 mg/dL — ABNORMAL HIGH (ref 70–99)
Glucose-Capillary: 204 mg/dL — ABNORMAL HIGH (ref 70–99)
Glucose-Capillary: 241 mg/dL — ABNORMAL HIGH (ref 70–99)
Glucose-Capillary: 216 mg/dL — ABNORMAL HIGH (ref 70–99)

## 2019-05-14 MED ORDER — WARFARIN SODIUM 5 MG PO TABS
5.0000 mg | ORAL_TABLET | Freq: Once | ORAL | Status: AC
  Administered 2019-05-14: 17:00:00 5 mg via ORAL
  Filled 2019-05-14: qty 1

## 2019-05-14 NOTE — Progress Notes (Signed)
[place] for Heparin  Indication: hx of DVT  Allergies  Allergen Reactions  . Penicillins Rash    Has patient had a PCN reaction causing immediate rash, facial/tongue/throat swelling, SOB or lightheadedness with hypotension: Yes Has patient had a PCN reaction causing severe rash involving mucus membranes or skin necrosis: No Has patient had a PCN reaction that required hospitalization No Has patient had a PCN reaction occurring within the last 10 years: No If all of the above answers are "NO", then may proceed with Cephalosporin use.   REACTION: rash  . Ciprofloxacin     nausea  . Ibuprofen Rash    swelling in leg     Patient Measurements: Height: 4\' 10"  (147.3 cm) Weight: 196 lb 3.2 oz (89 kg) IBW/kg (Calculated) : 40.9 Heparin Dosing Weight: 62.5kg  Vital Signs: Temp: 97.6 F (36.4 C) (09/07 1129) BP: 110/62 (09/07 2022) Pulse Rate: 86 (09/07 2022)  Labs: Recent Labs    05/12/19 0503 05/13/19 0555 05/14/19 0246 05/14/19 1002 05/14/19 1213 05/14/19 2006  HGB 14.1 14.5 13.7  --   --   --   HCT 42.7 44.1 41.0  --   --   --   PLT 251 250 244  --   --   --   LABPROT  --   --  13.9  --   --   --   INR  --   --  1.1  --   --   --   HEPARINUNFRC 0.63 0.66 0.75* 0.95* 0.87* 0.59  CREATININE 0.87 1.03* 1.12*  --   --   --     Estimated Creatinine Clearance: 55.1 mL/min (A) (by C-G formula based on SCr of 1.12 mg/dL (H)).  Assessment: 53 y.o. female with h/o DVT and lupus on Coumadin PTA.  Pt currently on heparin as well awaiting therapeutic INR with Coumadin.    Heparin level is therapeutic on 1100 units/hr.  Goal of Therapy:  Heparin level 0.3-0.7 units/ml Monitor platelets by anticoagulation protocol: Yes    Plan:  Continue heparin at 1100 units/hr. Next heparin level with AM labs.  [employer], Pharm.D., BCPS Clinical Pharmacist  **Pharmacist phone directory can now be found on [email or web address] ([email or web address]).  Listed  under [place].  05/14/2019 10:10 PM

## 2019-05-14 NOTE — Progress Notes (Addendum)
[place] for heparin + Coumadin Indication: hx of DVT  Allergies  Allergen Reactions  . Penicillins Rash    Has patient had a PCN reaction causing immediate rash, facial/tongue/throat swelling, SOB or lightheadedness with hypotension: Yes Has patient had a PCN reaction causing severe rash involving mucus membranes or skin necrosis: No Has patient had a PCN reaction that required hospitalization No Has patient had a PCN reaction occurring within the last 10 years: No If all of the above answers are "NO", then may proceed with Cephalosporin use.   REACTION: rash  . Ciprofloxacin     nausea  . Ibuprofen Rash    swelling in leg     Patient Measurements: Height: 4\' 10"  (147.3 cm) Weight: 196 lb 3.2 oz (89 kg) IBW/kg (Calculated) : 40.9 Heparin Dosing Weight: 62.5kg  Vital Signs: Temp: 97.5 F (36.4 C) (09/07 0500) Temp Source: Oral (09/07 0500) BP: 114/81 (09/07 0808) Pulse Rate: 80 (09/07 0845)  Labs: Recent Labs    05/12/19 0503 05/13/19 0555 05/14/19 0246 05/14/19 1002  HGB 14.1 14.5 13.7  --   HCT 42.7 44.1 41.0  --   PLT 251 250 244  --   LABPROT  --   --  13.9  --   INR  --   --  1.1  --   HEPARINUNFRC 0.63 0.66 0.75* 0.95*  CREATININE 0.87 1.03* 1.12*  --     Estimated Creatinine Clearance: 55.1 mL/min (A) (by C-G formula based on SCr of 1.12 mg/dL (H)).  Assessment: 53 y.o. female with h/o DVT and lupus. Coumadin was on hold but consult was received on 9/6 to bridge back to warfarin.   PTA pt was taking Coumadin 2.5mg  daily. Last dose was 8/29 @1000  and INR at admission was sub-therapeutic at 1.3 but seems to be d/t not taking warfarin x 2 days.  Heparin level this morning is elevated at 0.95 after dose was decreased by 1000 units/hr overnight due to elevated level of 0.75. Prior to this, patient has been stable on 1300 units/hour since 8/31. Per nursing the patient doesn't remember how the lab was drawn and the  patient is unable to tell. Stat heparin level was drawn at 1213 that was still supratherapeutic at 0.87. H&H today stable at 13.7/41.0 and plts are wnl at 244.   Unclear how the heparin level is still elevated after a dose increase, but will decrease the infusion rate due to repeat elevated lab.   INR today is 1.1 after 1 4 mg dose into bridge. Per chart review appears patient was on 5 mg daily earlier this year. Metronidazole may increase the therapeutic effect of warfarin.    Goal of Therapy:  Heparin level 0.3-0.7 units/ml Monitor platelets by anticoagulation protocol: Yes  INR level: 2-3   Plan:  - decrease heparin to 1100 units/hr (~2 units/kg) - warfarin 5 mg PO x 1 tonight  - check 6 hour heparin level  -Monitor daily INR, heparin level, CBC, and s/sx of bleeding - monitor for s/sx of bleeding    Thank you,   Eddie Candle, PharmD PGY-1 Pharmacy Resident   Please check amion for clinical pharmacist contact number

## 2019-05-14 NOTE — Progress Notes (Signed)
PROGRESS NOTE    Theresa [name]  HAL:[id number] DOB: October 02, 1965 DOA: 05/07/2019 PCP: Monico Blitz, MD   Brief Narrative:  HPI On 05/07/2019 by Dr. Fuller Plan Theresa [name] is a 53 y.o. female with medical history significant of CAD s/p Cabg, chronic respiratory failure due to COPD trach dependent since 2002, PEA arrest due to a dislodged trach, DVT and lupus anticoagulant on chronic Coumadin, hypertension, and insulin-dependent diabetes mellitus type 2, and morbid obesity; who presents with complaints of abdominal pain.  Pain is located in the epigastrically and in the right quadrant with radiation to her back.  Associated symptoms included nausea, vomiting, and diarrhea over the last 2 days.  Endorses increasing shortness of breath, abdominal distention, productive cough, chills, and sweats.  She was evaluated in the emergency department 2 weeks ago and noted to have cholelithiasis without cholecystitis on CT scan.  Interim history Patient admitted with right upper quadrant abdominal pain found to have cholelithiasis with presumed cholecystitis.  General surgery consulted and appreciated.  Patient also noted to have acute CHF exacerbation as well as a respiratory failure and hypoxia.  Cardiology and pulmonology consulted and following.  Patient currently diuresing. Assessment & Plan   Cholelithiasis with right upper quadrant abdominal pain, presumed cholecystitis -Right upper quadrant ultrasound showed gallstones -General surgery consulted and appreciated feels that patient clinically has acute cholecystitis.  Patient was started on IV cefepime and Flagyl -Cardiology and pulmonology consulted for preoperative assessment -Patient felt to be high risk -per surgery, if worsens, consider perc chole drain -Patient with increased abdominal pain overnight -will consult IR  Acute on chronic systolic CHF exacerbation -Echocardiogram May 2020 showed an EF of 40 to 45% -Chest x-ray with  cardiomegaly, interstitial edema -BNP 2226 -Cardiology consulted and appreciated -was on IV Lasix 80mg  BID, was given metolazone on 9/2-9/4 -Monitor intake and output, daily weights -UOP over past 24hrs 2140cc -Continue metoprolol, statin, losartan -Currently on heparin -patient did have mild hypotension, cardiology holding further lasix and will monitor  Acute on chronic respiratory failure with hypoxia/COPD/trach dependent -In the emergency department, patient was noted to be hypoxic and needing 8 L of oxygen.  Baseline 3 to 4 L -Suspect secondary to volume overload -As above, pulmonology consulted and appreciated -Continue pulmonary toilet, nebulizer treatments as needed -trach changed on 05/13/2019- #[id number] shiley  History of DVT/lupus anticoagulant -Patient with known history of left lower extremity DVT as well as lupus anticoagulant. -Had been taking Coumadin prior to admission-however had stopped 2 days PTA due to nausea and vomiting -Currently on heparin -Coumadin restarted  Severe hypokalemia/hypomagnesemia -Potassium 3.5; magnesium 1.8 -Continue to monitor  Diabetes mellitus, type II -Continue Lantus, insulin sliding scale and CBG monitoring -Increased ISS to resistant, increased premeal insulin to 5u  Anxiety/depression -Continue Zoloft  Essential hypertension -BP stable, continue metoprolol, losartan  CAD/history of CABG, hyperlipidemia -Continue metoprolol, losartan, statin  Morbid obesity -BMI 40.86 -Patient will need to follow up with her PCP to discuss lifestyle modifications  DVT Prophylaxis  Heparin  Code Status: Full  Family Communication: None at bedside  Disposition Plan: Admitted. Pending improvement in fluid status and abdominal pain  Consultants General surgery Cardiology Pulmonology  Interventional radiology  Procedures  None  Antibiotics   Anti-infectives (From admission, onward)   Start     Dose/Rate Route Frequency Ordered  Stop   05/07/19 1630  ceFEPIme (MAXIPIME) 2 g in sodium chloride 0.9 % 100 mL IVPB     2 g 200 mL/hr over 30  Minutes Intravenous Every 8 hours 05/07/19 1612     05/07/19 1600  metroNIDAZOLE (FLAGYL) IVPB 500 mg     500 mg 100 mL/hr over 60 Minutes Intravenous Every 8 hours 05/07/19 1548        Subjective:   Theresa [name] seen and examined today.  Patient complaining of abdominal pain, more so right upper quadrant which goes to her back.  She states it was really bad last night and this morning.  She does feel a bit hungry.  Denies current nausea or vomiting.  Currently denies any chest pain.  Feels breathing has improved however has not been out of bed.  Denies current dizziness or headache.   Objective:   Vitals:   05/14/19 0601 05/14/19 0800 05/14/19 0808 05/14/19 0845  BP:  114/81 114/81   Pulse:  74 73 80  Resp:  17  20  Temp:      TempSrc:      SpO2: 93% 94% 95% 95%  Weight:      Height:        Intake/Output Summary (Last 24 hours) at 05/14/2019 0909 Last data filed at 05/14/2019 0800 Gross per 24 hour  Intake 2326.05 ml  Output 2140 ml  Net 186.05 ml   Filed Weights   05/12/19 0400 05/13/19 0423 05/14/19 0500  Weight: 88.7 kg 88.7 kg 89 kg   Exam  General: Well developed, chronically ill-appearing, NAD  HEENT: NCAT, mucous membranes moist.   Neck: Trach  Cardiovascular: S1 S2 auscultated, no rubs, murmurs or gallops. Regular rate and rhythm.  Respiratory: Clear to auscultation bilaterally with equal chest rise  Abdomen: Soft, obese, RUQ TTP, nondistended, + bowel sounds  Extremities: warm dry without cyanosis clubbing or edema  Neuro: AAOx3, nonfocal  Psych: Appropriate mood and affect, pleasant  Data Reviewed: I have personally reviewed following labs and imaging studies  CBC: Recent Labs  Lab 05/10/19 0402 05/11/19 0426 05/12/19 0503 05/13/19 0555 05/14/19 0246  WBC 8.4 7.0 8.4 9.4 8.4  HGB 12.7 14.0 14.1 14.5 13.7  HCT 39.5 42.9 42.7 44.1  41.0  MCV 98.5 97.1 96.6 96.9 96.5  PLT 274 264 251 250 761   Basic Metabolic Panel: Recent Labs  Lab 05/09/19 0713 05/10/19 0402 05/11/19 0426 05/12/19 0503 05/13/19 0555 05/14/19 0246  NA 138 136 136 134* 135 135  K 4.2 3.8 3.1* 3.9 4.0 3.5  CL 98 93* 88* 86* 87* 91*  CO2 26 30 33* 36* 36* 32  GLUCOSE 154* 177* 183* 161* 209* 206*  BUN 17 16 19  21* 24* 26*  CREATININE 0.84 0.96 0.77 0.87 1.03* 1.12*  CALCIUM 8.3* 9.1 9.8 10.2 9.4 9.0  MG 1.7 1.9 1.5*  --  1.6* 1.8   GFR: Estimated Creatinine Clearance: 55.1 mL/min (A) (by C-G formula based on SCr of 1.12 mg/dL (H)). Liver Function Tests: Recent Labs  Lab 05/07/19 1129 05/08/19 0352  AST 17 14*  ALT 14 13  ALKPHOS 117 100  BILITOT 2.3* 2.0*  PROT 6.9 6.3*  ALBUMIN 3.1* 2.8*   Recent Labs  Lab 05/07/19 1129  LIPASE 17   No results for input(s): AMMONIA in the last 168 hours. Coagulation Profile: Recent Labs  Lab 05/07/19 1504 05/14/19 0246  INR 1.3* 1.1   Cardiac Enzymes: No results for input(s): CKTOTAL, CKMB, CKMBINDEX, TROPONINI in the last 168 hours. BNP (last 3 results) No results for input(s): PROBNP in the last 8760 hours. HbA1C: No results for input(s): HGBA1C in the last 72 hours.  CBG: Recent Labs  Lab 05/13/19 0814 05/13/19 1208 05/13/19 1638 05/13/19 2123 05/14/19 0806  GLUCAP 200* 177* 204* 123* 125*   Lipid Profile: No results for input(s): CHOL, HDL, LDLCALC, TRIG, CHOLHDL, LDLDIRECT in the last 72 hours. Thyroid Function Tests: No results for input(s): TSH, T4TOTAL, FREET4, T3FREE, THYROIDAB in the last 72 hours. Anemia Panel: No results for input(s): VITAMINB12, FOLATE, FERRITIN, TIBC, IRON, RETICCTPCT in the last 72 hours. Urine analysis:    Component Value Date/Time   COLORURINE AMBER (A) 05/08/2019 0405   APPEARANCEUR HAZY (A) 05/08/2019 0405   LABSPEC 1.040 (H) 05/08/2019 0405   PHURINE 5.0 05/08/2019 0405   GLUCOSEU 50 (A) 05/08/2019 0405   HGBUR NEGATIVE 05/08/2019  0405   BILIRUBINUR SMALL (A) 05/08/2019 0405   KETONESUR 5 (A) 05/08/2019 0405   PROTEINUR >=300 (A) 05/08/2019 0405   NITRITE NEGATIVE 05/08/2019 0405   LEUKOCYTESUR NEGATIVE 05/08/2019 0405   Sepsis Labs: @LABRCNTIP (procalcitonin:4,lacticidven:4)  ) Recent Results (from the past 240 hour(s))  SARS Coronavirus 2 [facility] order, Performed in [facility] hospital lab) Nasopharyngeal Nasopharyngeal Swab     Status: None   Collection Time: 05/07/19  3:59 PM   Specimen: Nasopharyngeal Swab  Result Value Ref Range Status   SARS Coronavirus 2 NEGATIVE NEGATIVE Final    Comment: (NOTE) If result is NEGATIVE SARS-CoV-2 target nucleic acids are NOT DETECTED. The SARS-CoV-2 RNA is generally detectable in upper and lower  respiratory specimens during the acute phase of infection. The lowest  concentration of SARS-CoV-2 viral copies this assay can detect is 250  copies / mL. A negative result does not preclude SARS-CoV-2 infection  and should not be used as the sole basis for treatment or other  patient management decisions.  A negative result may occur with  improper specimen collection / handling, submission of specimen other  than nasopharyngeal swab, presence of viral mutation(s) within the  areas targeted by this assay, and inadequate number of viral copies  (<250 copies / mL). A negative result must be combined with clinical  observations, patient history, and epidemiological information. If result is POSITIVE SARS-CoV-2 target nucleic acids are DETECTED. The SARS-CoV-2 RNA is generally detectable in upper and lower  respiratory specimens dur ing the acute phase of infection.  Positive  results are indicative of active infection with SARS-CoV-2.  Clinical  correlation with patient history and other diagnostic information is  necessary to determine patient infection status.  Positive results do  not rule out bacterial infection or co-infection with other viruses. If result is  PRESUMPTIVE POSTIVE SARS-CoV-2 nucleic acids MAY BE PRESENT.   A presumptive positive result was obtained on the submitted specimen  and confirmed on repeat testing.  While 2019 novel coronavirus  (SARS-CoV-2) nucleic acids may be present in the submitted sample  additional confirmatory testing may be necessary for epidemiological  and / or clinical management purposes  to differentiate between  SARS-CoV-2 and other Sarbecovirus currently known to infect humans.  If clinically indicated additional testing with an alternate test  methodology [id number]) is advised. The SARS-CoV-2 RNA is generally  detectable in upper and lower respiratory sp ecimens during the acute  phase of infection. The expected result is Negative. Fact Sheet for Patients:  StrictlyIdeas.no Fact Sheet for Healthcare Providers: BankingDealers.co.za This test is not yet approved or cleared by the [place] FDA and has been authorized for detection and/or diagnosis of SARS-CoV-2 by FDA under an Emergency Use Authorization (EUA).  This EUA will remain in effect (meaning  this test can be used) for the duration of the COVID-19 declaration under Section 564(b)(1) of the Act, 21 U.S.C. section 360bbb-3(b)(1), unless the authorization is terminated or revoked sooner. Performed at [facility], [place] [address], [place], [place] [address]       Radiology Studies: No results found.   Scheduled Meds: . atorvastatin  20 mg Oral Daily  . famotidine  20 mg Oral Daily  . gabapentin  300 mg Oral BID  . gabapentin  600 mg Oral QHS  . insulin aspart  0-20 Units Subcutaneous TID WC  . insulin aspart  5 Units Subcutaneous TID WC  . insulin glargine  33 Units Subcutaneous q morning - 10a  . losartan  25 mg Oral Daily  . metoprolol succinate  25 mg Oral Daily  . sertraline  50 mg Oral Daily  . sodium chloride flush  3 mL Intravenous Q12H  . tamsulosin  0.4 mg Oral Daily   . Warfarin - Pharmacist Dosing Inpatient   Does not apply q1800   Continuous Infusions: . sodium chloride 500 mL (05/08/19 0150)  . ceFEPime (MAXIPIME) IV 2 g (05/14/19 0844)  . heparin 1,200 Units/hr (05/14/19 0518)  . metronidazole 500 mg (05/14/19 0846)     LOS: 7 days   Time Spent in minutes   30 minutes  Lydie Stammen D.O. on 05/14/2019 at 9:09 AM  Between 7am to 7pm - Please see pager noted on [email or web address]  After 7pm go to [email or web address]  And look for the night coverage person covering for me after hours  [facility] Office  [phone]

## 2019-05-14 NOTE — Evaluation (Signed)
Physical Therapy Evaluation Patient Details Name: Theresa [name] MRN: [id number] DOB: 03-Nov-1965 Today's Date: 05/14/2019   History of Present Illness  Patient is a 53 y/o female who presents with RUQ pain, N/V, diarrhea. Found to have cholelithiasis with presumed cholecystitis as well as CHF exacerbation. PMH includes CAd s/p CABG, chronic respiratory failure due to COPD, trach dependent since 2002, lupus, HTN, DVT, DM, CHF,  Clinical Impression  Patient presents with generalized weakness, deconditioning, decreased endurance, dyspnea on exertion and impaired mobility s/p above. Pt independent PTA with assist for donning socks/IADLs. Has support from spouse who is disabled. Today, pt tolerated transfers and gait training with Min guard assist using IV pole for support. Sp02 dropped to 88% on 45% Fi02 10L TC. Pt normally wears 3 L/min 02 at home in her nose. Encouraged increasing activity while in the hospital. Reports doing her own exercises daily and encouraged to continue. Will follow acutely to maximize independence and mobility prior to return home.     Follow Up Recommendations Home health PT;Supervision - Intermittent    Equipment Recommendations  None recommended by PT    Recommendations for Other Services       Precautions / Restrictions Precautions Precautions: Fall Precaution Comments: trach, LUE oyster shell brace (broken humerus per report) Restrictions Weight Bearing Restrictions: No      Mobility  Bed Mobility Overal bed mobility: Modified Independent             General bed mobility comments: Use of rail, HOB elevated.  Transfers Overall transfer level: Needs assistance Equipment used: None Transfers: Sit to/from Stand Sit to Stand: Min guard         General transfer comment: Min guard for safety. Stood from [employer].  Ambulation/Gait Ambulation/Gait assistance: Min guard Gait Distance (Feet): 60 Feet Assistive device: IV Pole Gait Pattern/deviations:  Step-through pattern;Decreased stride length Gait velocity: decreased   General Gait Details: Slow, mildly unsteady gait holding onto IV pole for support; ambulated on 45% Fi02 10 L and Sp02 dropped to 88%, recovered quickly withr est breaks. BP stable.  Stairs            Wheelchair Mobility    Modified Rankin (Stroke Patients Only)       Balance Overall balance assessment: Needs assistance;History of Falls Sitting-balance support: Feet supported;No upper extremity supported Sitting balance-Leahy Scale: Good Sitting balance - Comments: Assist to donn socks   Standing balance support: During functional activity Standing balance-Leahy Scale: Fair Standing balance comment: Able to stand statically, but right now does better wit UE support for dynamic tasks.                             Pertinent Vitals/Pain Pain Assessment: Faces Faces Pain Scale: Hurts a little bit Pain Location: abdominal pain Pain Descriptors / Indicators: Aching;Discomfort Pain Intervention(s): Repositioned;Monitored during session    [place] expects to be discharged to:: Private residence Living Arrangements: Spouse/significant other;Parent Available Help at Discharge: Family;Available 24 hours/day Type of Home: House Home Access: Stairs to enter Entrance Stairs-Rails: Psychiatric nurse of Steps: 3 Home Layout: One level Home Equipment: Walker - 2 wheels;Cane - single point;Bedside commode;Wheelchair - manual;Grab bars - tub/shower;Tub bench;Shower seat      Prior Function Level of Independence: Needs assistance   Gait / Transfers Assistance Needed: independent without AD  ADL's / Homemaking Assistance Needed: assist with IADLs and help with socks  Hand Dominance        Extremity/Trunk Assessment   Upper Extremity Assessment Upper Extremity Assessment: Defer to OT evaluation    Lower Extremity Assessment Lower Extremity  Assessment: Generalized weakness    Cervical / Trunk Assessment Cervical / Trunk Assessment: Normal  Communication   Communication: No difficulties  Cognition Arousal/Alertness: Awake/alert Behavior During Therapy: WFL for tasks assessed/performed Overall Cognitive Status: Within Functional Limits for tasks assessed                                        General Comments General comments (skin integrity, edema, etc.): Supine BP 114/81, sitting BP post activity 135/92.    Exercises General Exercises - Lower Extremity Ankle Circles/Pumps: Both;15 reps;Seated Long Arc Quad: Both;10 reps;Seated   Assessment/Plan    PT Assessment Patient needs continued PT services  PT Problem List Decreased strength;Decreased mobility;Pain;Cardiopulmonary status limiting activity;Decreased activity tolerance       PT Treatment Interventions Therapeutic activities;Gait training;Therapeutic exercise;Patient/family education;Balance training;Functional mobility training;Stair training    PT Goals (Current goals can be found in the Care Plan section)  Acute Rehab PT Goals Patient Stated Goal: to get better and get this surgery PT Goal Formulation: With patient Time For Goal Achievement: 05/28/19 Potential to Achieve Goals: Good    Frequency Min 3X/week   Barriers to discharge        Co-evaluation               AM-PAC PT "6 Clicks" Mobility  Outcome Measure Help needed turning from your back to your side while in a flat bed without using bedrails?: None Help needed moving from lying on your back to sitting on the side of a flat bed without using bedrails?: A Little Help needed moving to and from a bed to a chair (including a wheelchair)?: A Little Help needed standing up from a chair using your arms (e.g., wheelchair or bedside chair)?: A Little Help needed to walk in hospital room?: A Little Help needed climbing 3-5 steps with a railing? : A Little 6 Click Score:  19    End of Session Equipment Utilized During Treatment: Oxygen(45% Fi02, 10 L trach collar) Activity Tolerance: Patient tolerated treatment well Patient left: in bed;with call bell/phone within reach(sitting EOB, no chair in room) Nurse Communication: Mobility status PT Visit Diagnosis: Difficulty in walking, not elsewhere classified (R26.2)    Time: 0175-1025 PT Time Calculation (min) (ACUTE ONLY): 33 min   Charges:   PT Evaluation $PT Eval Moderate Complexity: 1 Mod PT Treatments $Gait Training: 8-22 mins        Wray Kearns, PT, DPT Acute Rehabilitation Services Pager [phone] Office [phone]      Marguarite Arbour A [place] 05/14/2019, 10:18 AM

## 2019-05-14 NOTE — Progress Notes (Signed)
ANTICOAGULATION CONSULT NOTE - Follow Up Consult  Pharmacy Consult for heparin Indication: h/o VTE  Labs: Recent Labs    05/12/19 0503 05/13/19 0555 05/14/19 0246  HGB 14.1 14.5 13.7  HCT 42.7 44.1 41.0  PLT 251 250 244  LABPROT  --   --  13.9  INR  --   --  1.1  HEPARINUNFRC 0.63 0.66 0.75*  CREATININE 0.87 1.03* 1.12*    Assessment: 53yo female now supratherapeutic on heparin after several levels at goal though had been trending up; no gtt issues or signs of bleeding per RN.  Goal of Therapy:  Heparin level 0.3-0.7 units/ml   Plan:  Will decrease heparin gtt by ~1 units/kg/hr to 1200 units/hr and check level in 6 hours.    Wynona Neat, PharmD, BCPS  05/14/2019,4:50 AM

## 2019-05-14 NOTE — Progress Notes (Addendum)
Progress Note  Patient Name: Theresa [name] Date of Encounter: 05/14/2019  Primary Cardiologist: [name], MD   Subjective   Denies any chest pain or SOB.  Wants to know what is going to happen with her GB  Inpatient Medications    Scheduled Meds: . atorvastatin  20 mg Oral Daily  . famotidine  20 mg Oral Daily  . gabapentin  300 mg Oral BID  . gabapentin  600 mg Oral QHS  . insulin aspart  0-20 Units Subcutaneous TID WC  . insulin aspart  5 Units Subcutaneous TID WC  . insulin glargine  33 Units Subcutaneous q morning - 10a  . losartan  25 mg Oral Daily  . metoprolol succinate  25 mg Oral Daily  . sertraline  50 mg Oral Daily  . sodium chloride flush  3 mL Intravenous Q12H  . tamsulosin  0.4 mg Oral Daily  . Warfarin - Pharmacist Dosing Inpatient   Does not apply q1800   Continuous Infusions: . sodium chloride 500 mL (05/08/19 0150)  . ceFEPime (MAXIPIME) IV 2 g (05/14/19 0016)  . heparin 1,200 Units/hr (05/14/19 0518)  . metronidazole 500 mg (05/14/19 0013)   PRN Meds: sodium chloride, acetaminophen **OR** acetaminophen, albuterol, HYDROcodone-acetaminophen, LORazepam, methocarbamol, morphine injection, ondansetron **OR** ondansetron (ZOFRAN) IV   Vital Signs    Vitals:   05/14/19 0049 05/14/19 0500 05/14/19 0601 05/14/19 0808  BP: 100/67 97/72  114/81  Pulse: 75 73  73  Resp: 13 12    Temp:  (!) 97.5 F (36.4 C)    TempSrc:  Oral    SpO2: 92% 99% 93% 95%  Weight:  89 kg    Height:        Intake/Output Summary (Last 24 hours) at 05/14/2019 0834 Last data filed at 05/14/2019 0502 Gross per 24 hour  Intake 2086.05 ml  Output 2140 ml  Net -53.95 ml   Filed Weights   05/12/19 0400 05/13/19 0423 05/14/19 0500  Weight: 88.7 kg 88.7 kg 89 kg    Telemetry    NSR - Personally Reviewed  Physical Exam   GEN: Well nourished, well developed in no acute distress HEENT: Normal NECK: No JVD; No carotid bruits LYMPHATICS: No lymphadenopathy CARDIAC:RRR,  no murmurs, rubs, gallops RESPIRATORY:  Scattered rhonchi ABDOMEN: Soft, non-tender, non-distended MUSCULOSKELETAL:  No edema; No deformity  SKIN: Warm and dry NEUROLOGIC:  Alert and oriented x 3 PSYCHIATRIC:  Normal affect    Labs    Chemistry Recent Labs  Lab 05/07/19 1129 05/08/19 0352  05/12/19 0503 05/13/19 0555 05/14/19 0246  NA 137 138   < > 134* 135 135  K 3.2* 2.7*   < > 3.9 4.0 3.5  CL 92* 93*   < > 86* 87* 91*  CO2 28 32   < > 36* 36* 32  GLUCOSE 302* 249*   < > 161* 209* 206*  BUN 10 13   < > 21* 24* 26*  CREATININE 0.75 0.78   < > 0.87 1.03* 1.12*  CALCIUM 8.8* 8.4*   < > 10.2 9.4 9.0  PROT 6.9 6.3*  --   --   --   --   ALBUMIN 3.1* 2.8*  --   --   --   --   AST 17 14*  --   --   --   --   ALT 14 13  --   --   --   --   ALKPHOS 117 100  --   --   --   --  BILITOT 2.3* 2.0*  --   --   --   --   GFRNONAA >60 >60   < > >60 >60 56*  GFRAA >60 >60   < > >60 >60 >60  ANIONGAP 17* 13   < > 12 12 12    < > = values in this interval not displayed.     Hematology Recent Labs  Lab 05/12/19 0503 05/13/19 0555 05/14/19 0246  WBC 8.4 9.4 8.4  RBC 4.42 4.55 4.25  HGB 14.1 14.5 13.7  HCT 42.7 44.1 41.0  MCV 96.6 96.9 96.5  MCH 31.9 31.9 32.2  MCHC 33.0 32.9 33.4  RDW 18.3* 18.2* 18.0*  PLT 251 250 244    Cardiac EnzymesNo results for input(s): TROPONINI in the last 168 hours. No results for input(s): TROPIPOC in the last 168 hours.   BNP Recent Labs  Lab 05/07/19 1129  BNP 2,264.5*     DDimer No results for input(s): DDIMER in the last 168 hours.   Radiology    No results found.  Cardiac Studies   Echocardiogram 01/2019: IMPRESSIONS   1. Severe hypokinesis of the left ventricular, basal-mid inferoseptal wall, inferior wall and inferolateral wall. 2. The left ventricle has mild-moderately reduced systolic function, with an ejection fraction of 40-45%. The cavity size was normal. Left ventricular diastolic Doppler parameters are consistent  with pseudonormal. Elevated mean left atrial pressure. 3. The right ventricle has mildly reduced systolic function. The cavity was normal. There is no increase in right ventricular wall thickness. Right ventricular systolic pressure is moderately elevated with an estimated pressure of 58 mmHg. 4. Mitral valve regurgitation is mild to moderate by color flow Doppler. The MR jet is centrally-directed. 5. The inferior vena cava was dilated in size with >50% respiratory variability. 6. Left atrial size was moderately dilated. 7. Right atrial size was mildly dilated.  Right/Left heart cath 03/2018:  Severe native coronary artery disease with total occlusion of the proximal LAD, total occlusion of the proximal RCA, and patent circumflex with patent proximal stent with eccentric 50% proximal narrowing. Distal obtuse marginal branches are severely and diffusely diseased.  Bypass graft failure with total occlusion of SVG to RCA.  Patent LIMA to LAD.  Right coronary territory is supplied by collaterals from LAD and circumflex.  Left ventricular systolic dysfunction with EF 35 to 45%. Mid to distal inferior wall akinesis. Inferobasal and inferoapical wall severely hypokinetic. Left ventricular end-diastolic pressure elevated at 19 mmHg  Moderate to severe pulmonary hypertension   Patient Profile     53 y.o. 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 Cardiology following for preop assessment and optimization for surgery.   Assessment & Plan    1. Preoperative assessment: High risk for open surgery  - continue conservative management of cholecystitis with IV antibiotics. If conditions worsen could consider percutaneous  drainage.   2. Acute on chronic combined CHF:   -BNP >2000. CXR with interstitial edema good diuresis with improved CXR 9/4  -put out 2.1L yesterday and is net neg 4.5L -Creatinine slightly up at 1.12. -lasix on hold due to hypotension yesterday -BP improved today -with good diuresis yesterday, bumped creatinine and hypotension yesterday, will hold on Lasix today and reassess in am  3. Electrolyte imbalance: K improved 3.1->3.5 today  4. Cholecystitis:  Conservative therapy vs perc chole drain.  - Continue management per surgery and primary team  5. Anticoagulation:  Change back over to oral coumadin once ok with surgery.  For now on IV Heparin gtt  For questions or updates, please contact [place] Please consult [email or web address] for contact info under Cardiology/STEMI.      Signed, Fransico Him, MD  05/14/2019, 8:34 AM   [phone]

## 2019-05-15 ENCOUNTER — Inpatient Hospital Stay (HOSPITAL_COMMUNITY): Payer: Medicare Other

## 2019-05-15 LAB — CBC
HCT: 40.3 % (ref 36.0–46.0)
Hemoglobin: 13.3 g/dL (ref 12.0–15.0)
MCH: 32.3 pg (ref 26.0–34.0)
MCHC: 33 g/dL (ref 30.0–36.0)
MCV: 97.8 fL (ref 80.0–100.0)
Platelets: 217 10*3/uL (ref 150–400)
RBC: 4.12 MIL/uL (ref 3.87–5.11)
RDW: 18.1 % — ABNORMAL HIGH (ref 11.5–15.5)
WBC: 8.8 10*3/uL (ref 4.0–10.5)
nRBC: 0 % (ref 0.0–0.2)

## 2019-05-15 LAB — HEPATIC FUNCTION PANEL
ALT: 18 U/L (ref 0–44)
AST: 28 U/L (ref 15–41)
Albumin: 2.8 g/dL — ABNORMAL LOW (ref 3.5–5.0)
Alkaline Phosphatase: 85 U/L (ref 38–126)
Bilirubin, Direct: 0.2 mg/dL (ref 0.0–0.2)
Indirect Bilirubin: 0.6 mg/dL (ref 0.3–0.9)
Total Bilirubin: 0.8 mg/dL (ref 0.3–1.2)
Total Protein: 6.4 g/dL — ABNORMAL LOW (ref 6.5–8.1)

## 2019-05-15 LAB — BASIC METABOLIC PANEL
Anion gap: 8 (ref 5–15)
BUN: 22 mg/dL — ABNORMAL HIGH (ref 6–20)
CO2: 30 mmol/L (ref 22–32)
Calcium: 9.1 mg/dL (ref 8.9–10.3)
Chloride: 97 mmol/L — ABNORMAL LOW (ref 98–111)
Creatinine, Ser: 0.9 mg/dL (ref 0.44–1.00)
GFR calc Af Amer: >60 mL/min (ref >60)
GFR calc non Af Amer: >60 mL/min (ref >60)
Glucose, Bld: 172 mg/dL — ABNORMAL HIGH (ref 70–99)
Potassium: 3.8 mmol/L (ref 3.5–5.1)
Sodium: 135 mmol/L (ref 135–145)

## 2019-05-15 LAB — PROTIME-INR
INR: 1.1 (ref 0.8–1.2)
Prothrombin Time: 13.7 seconds (ref 11.4–15.2)

## 2019-05-15 LAB — HEPARIN LEVEL (UNFRACTIONATED): Heparin Unfractionated: 0.54 IU/mL (ref 0.30–0.70)

## 2019-05-15 LAB — GLUCOSE, CAPILLARY
Glucose-Capillary: 136 mg/dL — ABNORMAL HIGH (ref 70–99)
Glucose-Capillary: 79 mg/dL (ref 70–99)
Glucose-Capillary: 146 mg/dL — ABNORMAL HIGH (ref 70–99)
Glucose-Capillary: 225 mg/dL — ABNORMAL HIGH (ref 70–99)

## 2019-05-15 LAB — MAGNESIUM: Magnesium: 1.8 mg/dL (ref 1.7–2.4)

## 2019-05-15 MED ORDER — PANTOPRAZOLE SODIUM 40 MG PO TBEC
40.0000 mg | DELAYED_RELEASE_TABLET | Freq: Every day | ORAL | Status: DC
  Administered 2019-05-15 – 2019-05-17 (×3): 40 mg via ORAL
  Filled 2019-05-15 (×3): qty 1

## 2019-05-15 MED ORDER — WARFARIN SODIUM 5 MG PO TABS
5.0000 mg | ORAL_TABLET | Freq: Once | ORAL | Status: AC
  Administered 2019-05-15: 5 mg via ORAL
  Filled 2019-05-15: qty 1

## 2019-05-15 MED ORDER — TECHNETIUM TC 99M MEBROFENIN IV KIT
5.0000 | PACK | Freq: Once | INTRAVENOUS | Status: AC | PRN
  Administered 2019-05-15: 10:00:00 5 via INTRAVENOUS

## 2019-05-15 MED ORDER — INFLUENZA VAC SPLIT QUAD 0.5 ML IM SUSY
0.5000 mL | PREFILLED_SYRINGE | INTRAMUSCULAR | Status: AC
  Administered 2019-05-17: 13:00:00 0.5 mL via INTRAMUSCULAR
  Filled 2019-05-15: qty 0.5

## 2019-05-15 NOTE — Progress Notes (Signed)
PROGRESS NOTE    AYDE RECORD  DGU:[id number] DOB: 1965/12/27 DOA: 05/07/2019 PCP: Monico Blitz, MD   Brief Narrative:  HPI On 05/07/2019 by Dr. Fuller Plan Theresa [name] is a 53 y.o. female with medical history significant of CAD s/p Cabg, chronic respiratory failure due to COPD trach dependent since 2002, PEA arrest due to a dislodged trach, DVT and lupus anticoagulant on chronic Coumadin, hypertension, and insulin-dependent diabetes mellitus type 2, and morbid obesity; who presents with complaints of abdominal pain.  Pain is located in the epigastrically and in the right quadrant with radiation to her back.  Associated symptoms included nausea, vomiting, and diarrhea over the last 2 days.  Endorses increasing shortness of breath, abdominal distention, productive cough, chills, and sweats.  She was evaluated in the emergency department 2 weeks ago and noted to have cholelithiasis without cholecystitis on CT scan.  Interim history Patient admitted with right upper quadrant abdominal pain found to have cholelithiasis with presumed cholecystitis.  General surgery consulted and appreciated.  Patient also noted to have acute CHF exacerbation as well as a respiratory failure and hypoxia.  Cardiology and pulmonology consulted and following.  Patient currently diuresing. General surgery signed off as patient is high risk for surgery. Will obtain HIDA scan today. Assessment & Plan   Cholelithiasis with right upper quadrant abdominal pain, presumed cholecystitis -Right upper quadrant ultrasound showed gallstones -General surgery consulted and appreciated feels that patient clinically has acute cholecystitis.  Patient was started on IV cefepime and Flagyl -Cardiology and pulmonology consulted for preoperative assessment -Patient felt to be high risk -per surgery, if worsens, consider perc chole drain -Patient with increased abdominal pain overnight -Discussed with IR, Dr. Pascal Lux. Doubt placing perc  tube would resolve pain.  Recommended obtaining HIDA scan. -pending HIDA -?may need GI consult  Acute on chronic systolic CHF exacerbation -Echocardiogram May 2020 showed an EF of 40 to 45% -Chest x-ray with cardiomegaly, interstitial edema -BNP 2226 -Cardiology consulted and appreciated -was on IV Lasix 80mg  BID, was given metolazone on 9/2-9/4 -Monitor intake and output, daily weights -UOP over past 24hrs 1000cc -Continue metoprolol, statin, losartan -Currently on heparin -patient did have mild hypotension, cardiology holding further lasix and will monitor  Acute on chronic respiratory failure with hypoxia/COPD/trach dependent -In the emergency department, patient was noted to be hypoxic and needing 8 L of oxygen.  Baseline 3 to 4 L -Suspect secondary to volume overload -As above, pulmonology consulted and appreciated -Continue pulmonary toilet, nebulizer treatments as needed -trach changed on 05/13/2019- #[id number] shiley  History of DVT/lupus anticoagulant -Patient with known history of left lower extremity DVT as well as lupus anticoagulant. -Had been taking Coumadin prior to admission-however had stopped 2 days PTA due to nausea and vomiting -Currently on heparin -Coumadin restarted  Severe hypokalemia/hypomagnesemia -Potassium 3.5; magnesium 1.8 -Continue to monitor  Diabetes mellitus, type II -Continue Lantus, insulin sliding scale and CBG monitoring -Increased ISS to resistant, increased premeal insulin to 5u  Anxiety/depression -Continue Zoloft  Essential hypertension -BP stable, continue metoprolol, losartan  CAD/history of CABG, hyperlipidemia -Continue metoprolol, losartan, statin  Morbid obesity -BMI 40.86 -Patient will need to follow up with her PCP to discuss lifestyle modifications  Deconditioning -PT recommended HH  DVT Prophylaxis  Heparin  Code Status: Full  Family Communication: None at bedside  Disposition Plan: Admitted. Pending  improvement in fluid status and abdominal pain- will obtain HIDA scan  Consultants General surgery Cardiology Pulmonology  Interventional radiology  Procedures  None  Antibiotics  Anti-infectives (From admission, onward)   Start     Dose/Rate Route Frequency Ordered Stop   05/07/19 1630  ceFEPIme (MAXIPIME) 2 g in sodium chloride 0.9 % 100 mL IVPB     2 g 200 mL/hr over 30 Minutes Intravenous Every 8 hours 05/07/19 1612     05/07/19 1600  metroNIDAZOLE (FLAGYL) IVPB 500 mg     500 mg 100 mL/hr over 60 Minutes Intravenous Every 8 hours 05/07/19 1548        Subjective:   Theresa [name] seen and examined today.  Continues to have abdominal pain and swelling which radiates to her back.  Her abdominal pain is in the right upper quadrant.  She states that food does not affect her pain and she was able to tolerate a soft diet yesterday.  Denies current chest pain.  Feels breathing has improved.  Has some shortness of breath and walking with physical therapy yesterday.  Denies current nausea or vomiting, dizziness or headache.   Objective:   Vitals:   05/14/19 2211 05/14/19 2316 05/15/19 0436 05/15/19 0619  BP:   94/74   Pulse: 72 73 71   Resp: 16 15 16    Temp:    98.1 F (36.7 C)  TempSrc:    Oral  SpO2: 94% 94% 95%   Weight:   89 kg   Height:        Intake/Output Summary (Last 24 hours) at 05/15/2019 0842 Last data filed at 05/15/2019 0641 Gross per 24 hour  Intake 1374.67 ml  Output 1000 ml  Net 374.67 ml   Filed Weights   05/13/19 0423 05/14/19 0500 05/15/19 0436  Weight: 88.7 kg 89 kg 89 kg   Exam  General: Well developed, chronically ill-appearing, NAD  HEENT: NCAT, mucous membranes moist.   Neck: Trach  Cardiovascular: S1 S2 auscultated, RRR, no murmur  Respiratory: Clear to auscultation bilaterally anteriorly  Abdomen: Soft, obese, RUQ TTP, nondistended, + bowel sounds  Extremities: warm dry without cyanosis clubbing or edema  Neuro: AAOx3, nonfocal   Psych: Appropriate mood and affect, pleasant  Data Reviewed: I have personally reviewed following labs and imaging studies  CBC: Recent Labs  Lab 05/11/19 0426 05/12/19 0503 05/13/19 0555 05/14/19 0246 05/15/19 0503  WBC 7.0 8.4 9.4 8.4 8.8  HGB 14.0 14.1 14.5 13.7 13.3  HCT 42.9 42.7 44.1 41.0 40.3  MCV 97.1 96.6 96.9 96.5 97.8  PLT 264 251 250 244 397   Basic Metabolic Panel: Recent Labs  Lab 05/10/19 0402 05/11/19 0426 05/12/19 0503 05/13/19 0555 05/14/19 0246 05/15/19 0503  NA 136 136 134* 135 135 135  K 3.8 3.1* 3.9 4.0 3.5 3.8  CL 93* 88* 86* 87* 91* 97*  CO2 30 33* 36* 36* 32 30  GLUCOSE 177* 183* 161* 209* 206* 172*  BUN 16 19 21* 24* 26* 22*  CREATININE 0.96 0.77 0.87 1.03* 1.12* 0.90  CALCIUM 9.1 9.8 10.2 9.4 9.0 9.1  MG 1.9 1.5*  --  1.6* 1.8 1.8   GFR: Estimated Creatinine Clearance: 68.6 mL/min (by C-G formula based on SCr of 0.9 mg/dL). Liver Function Tests: No results for input(s): AST, ALT, ALKPHOS, BILITOT, PROT, ALBUMIN in the last 168 hours. No results for input(s): LIPASE, AMYLASE in the last 168 hours. No results for input(s): AMMONIA in the last 168 hours. Coagulation Profile: Recent Labs  Lab 05/14/19 0246 05/15/19 0503  INR 1.1 1.1   Cardiac Enzymes: No results for input(s): CKTOTAL, CKMB, CKMBINDEX, TROPONINI in the last 168  hours. BNP (last 3 results) No results for input(s): PROBNP in the last 8760 hours. HbA1C: No results for input(s): HGBA1C in the last 72 hours. CBG: Recent Labs  Lab 05/14/19 0806 05/14/19 1127 05/14/19 1717 05/14/19 2211 05/15/19 0814  GLUCAP 125* 204* 241* 216* 136*   Lipid Profile: No results for input(s): CHOL, HDL, LDLCALC, TRIG, CHOLHDL, LDLDIRECT in the last 72 hours. Thyroid Function Tests: No results for input(s): TSH, T4TOTAL, FREET4, T3FREE, THYROIDAB in the last 72 hours. Anemia Panel: No results for input(s): VITAMINB12, FOLATE, FERRITIN, TIBC, IRON, RETICCTPCT in the last 72 hours.  Urine analysis:    Component Value Date/Time   COLORURINE AMBER (A) 05/08/2019 0405   APPEARANCEUR HAZY (A) 05/08/2019 0405   LABSPEC 1.040 (H) 05/08/2019 0405   PHURINE 5.0 05/08/2019 0405   GLUCOSEU 50 (A) 05/08/2019 0405   HGBUR NEGATIVE 05/08/2019 0405   BILIRUBINUR SMALL (A) 05/08/2019 0405   KETONESUR 5 (A) 05/08/2019 0405   PROTEINUR >=300 (A) 05/08/2019 0405   NITRITE NEGATIVE 05/08/2019 0405   LEUKOCYTESUR NEGATIVE 05/08/2019 0405   Sepsis Labs: @LABRCNTIP (procalcitonin:4,lacticidven:4)  ) Recent Results (from the past 240 hour(s))  SARS Coronavirus 2 [facility] order, Performed in [facility] hospital lab) Nasopharyngeal Nasopharyngeal Swab     Status: None   Collection Time: 05/07/19  3:59 PM   Specimen: Nasopharyngeal Swab  Result Value Ref Range Status   SARS Coronavirus 2 NEGATIVE NEGATIVE Final    Comment: (NOTE) If result is NEGATIVE SARS-CoV-2 target nucleic acids are NOT DETECTED. The SARS-CoV-2 RNA is generally detectable in upper and lower  respiratory specimens during the acute phase of infection. The lowest  concentration of SARS-CoV-2 viral copies this assay can detect is 250  copies / mL. A negative result does not preclude SARS-CoV-2 infection  and should not be used as the sole basis for treatment or other  patient management decisions.  A negative result may occur with  improper specimen collection / handling, submission of specimen other  than nasopharyngeal swab, presence of viral mutation(s) within the  areas targeted by this assay, and inadequate number of viral copies  (<250 copies / mL). A negative result must be combined with clinical  observations, patient history, and epidemiological information. If result is POSITIVE SARS-CoV-2 target nucleic acids are DETECTED. The SARS-CoV-2 RNA is generally detectable in upper and lower  respiratory specimens dur ing the acute phase of infection.  Positive  results are indicative of active infection  with SARS-CoV-2.  Clinical  correlation with patient history and other diagnostic information is  necessary to determine patient infection status.  Positive results do  not rule out bacterial infection or co-infection with other viruses. If result is PRESUMPTIVE POSTIVE SARS-CoV-2 nucleic acids MAY BE PRESENT.   A presumptive positive result was obtained on the submitted specimen  and confirmed on repeat testing.  While 2019 novel coronavirus  (SARS-CoV-2) nucleic acids may be present in the submitted sample  additional confirmatory testing may be necessary for epidemiological  and / or clinical management purposes  to differentiate between  SARS-CoV-2 and other Sarbecovirus currently known to infect humans.  If clinically indicated additional testing with an alternate test  methodology [phone]) is advised. The SARS-CoV-2 RNA is generally  detectable in upper and lower respiratory sp ecimens during the acute  phase of infection. The expected result is Negative. Fact Sheet for Patients:  StrictlyIdeas.no Fact Sheet for Healthcare Providers: BankingDealers.co.za This test is not yet approved or cleared by the [place] FDA  and has been authorized for detection and/or diagnosis of SARS-CoV-2 by FDA under an Emergency Use Authorization (EUA).  This EUA will remain in effect (meaning this test can be used) for the duration of the COVID-19 declaration under Section 564(b)(1) of the Act, 21 U.S.C. section 360bbb-3(b)(1), unless the authorization is terminated or revoked sooner. Performed at [facility], [place] [address], [place], [place] [address]       Radiology Studies: No results found.   Scheduled Meds: . atorvastatin  20 mg Oral Daily  . famotidine  20 mg Oral Daily  . gabapentin  300 mg Oral BID  . gabapentin  600 mg Oral QHS  . insulin aspart  0-20 Units Subcutaneous TID WC  . insulin aspart  5 Units Subcutaneous TID  WC  . insulin glargine  33 Units Subcutaneous q morning - 10a  . losartan  25 mg Oral Daily  . metoprolol succinate  25 mg Oral Daily  . sertraline  50 mg Oral Daily  . sodium chloride flush  3 mL Intravenous Q12H  . tamsulosin  0.4 mg Oral Daily  . Warfarin - Pharmacist Dosing Inpatient   Does not apply q1800   Continuous Infusions: . sodium chloride 500 mL (05/08/19 0150)  . ceFEPime (MAXIPIME) IV Stopped (05/15/19 0130)  . heparin 1,100 Units/hr (05/15/19 0750)  . metronidazole Stopped (05/15/19 0056)     LOS: 8 days   Time Spent in minutes   30 minutes  Andris Brothers D.O. on 05/15/2019 at 8:42 AM  Between 7am to 7pm - Please see pager noted on [email or web address]  After 7pm go to [email or web address]  And look for the night coverage person covering for me after hours  [facility] Office  [phone]

## 2019-05-15 NOTE — Progress Notes (Signed)
[place] for Heparin, coumadin Indication: hx of DVT  Allergies  Allergen Reactions  . Penicillins Rash    Has patient had a PCN reaction causing immediate rash, facial/tongue/throat swelling, SOB or lightheadedness with hypotension: Yes Has patient had a PCN reaction causing severe rash involving mucus membranes or skin necrosis: No Has patient had a PCN reaction that required hospitalization No Has patient had a PCN reaction occurring within the last 10 years: No If all of the above answers are "NO", then may proceed with Cephalosporin use.   REACTION: rash  . Ciprofloxacin     nausea  . Ibuprofen Rash    swelling in leg     Patient Measurements: Height: 4\' 10"  (147.3 cm) Weight: 196 lb 2.7 oz (89 kg) IBW/kg (Calculated) : 40.9 Heparin Dosing Weight: 62.5kg  Vital Signs: Temp: 98.1 F (36.7 C) (09/08 0619) Temp Source: Oral (09/08 0619) BP: 94/74 (09/08 0436) Pulse Rate: 74 (09/08 1144)  Labs: Recent Labs    05/13/19 0555 05/14/19 0246  05/14/19 1213 05/14/19 2006 05/15/19 0503  HGB 14.5 13.7  --   --   --  13.3  HCT 44.1 41.0  --   --   --  40.3  PLT 250 244  --   --   --  217  LABPROT  --  13.9  --   --   --  13.7  INR  --  1.1  --   --   --  1.1  HEPARINUNFRC 0.66 0.75*   < > 0.87* 0.59 0.54  CREATININE 1.03* 1.12*  --   --   --  0.90   < > = values in this interval not displayed.    Estimated Creatinine Clearance: 68.6 mL/min (by C-G formula based on SCr of 0.9 mg/dL).  Assessment: 53 y.o. female with h/o DVT and lupus on Coumadin PTA.  Pt currently on heparin as well awaiting therapeutic INR with Coumadin.  She is noted on flagyl -Heparin level is therapeutic on 1100 units/hr.  PTA warfarin dose 2.5 mg daily Goal of Therapy:  Heparin level 0.3-0.7 units/ml Monitor platelets by anticoagulation protocol: Yes    Plan:  Coumadin 5mg  today Continue heparin at 1100 units/hr. Daily heparin level, CBC and  INR  Hildred Laser, PharmD Clinical Pharmacist **Pharmacist phone directory can now be found on [email or web address] ([email or web address]).  Listed under [place].

## 2019-05-15 NOTE — Progress Notes (Addendum)
Progress Note  Patient Name: Theresa [name] Date of Encounter: 05/15/2019  Primary Cardiologist: [name], MD   Subjective   Still w/ RUQ pain. Denies CP. No dyspnea.   Inpatient Medications    Scheduled Meds: . atorvastatin  20 mg Oral Daily  . famotidine  20 mg Oral Daily  . gabapentin  300 mg Oral BID  . gabapentin  600 mg Oral QHS  . insulin aspart  0-20 Units Subcutaneous TID WC  . insulin aspart  5 Units Subcutaneous TID WC  . insulin glargine  33 Units Subcutaneous q morning - 10a  . losartan  25 mg Oral Daily  . metoprolol succinate  25 mg Oral Daily  . sertraline  50 mg Oral Daily  . sodium chloride flush  3 mL Intravenous Q12H  . tamsulosin  0.4 mg Oral Daily  . Warfarin - Pharmacist Dosing Inpatient   Does not apply q1800   Continuous Infusions: . sodium chloride 500 mL (05/08/19 0150)  . ceFEPime (MAXIPIME) IV Stopped (05/15/19 0130)  . heparin 1,100 Units/hr (05/14/19 1320)  . metronidazole Stopped (05/15/19 0056)   PRN Meds: sodium chloride, acetaminophen **OR** acetaminophen, albuterol, HYDROcodone-acetaminophen, LORazepam, methocarbamol, morphine injection, ondansetron **OR** ondansetron (ZOFRAN) IV   Vital Signs    Vitals:   05/14/19 2211 05/14/19 2316 05/15/19 0436 05/15/19 0619  BP:   94/74   Pulse: 72 73 71   Resp: 16 15 16    Temp:    98.1 F (36.7 C)  TempSrc:    Oral  SpO2: 94% 94% 95%   Weight:   89 kg   Height:        Intake/Output Summary (Last 24 hours) at 05/15/2019 0743 Last data filed at 05/15/2019 0641 Gross per 24 hour  Intake 1734.67 ml  Output 1000 ml  Net 734.67 ml   Last 3 Weights 05/15/2019 05/14/2019 05/13/2019  Weight (lbs) 196 lb 2.7 oz 196 lb 3.2 oz 195 lb 8 oz  Weight (kg) 88.982 kg 88.996 kg 88.678 kg      Telemetry    NSR 60s, w/ PVCs, occasional ventricular trigeminy  - Personally Reviewed  ECG    No new EKGs to review today - Personally Reviewed  Physical Exam   GEN: Chronically ill appearing, middle  aged WF in no acute distress.   Neck: trach present, unable to assess JVD Cardiac: RRR, no murmurs, rubs, or gallops.  Respiratory: Clear to auscultation bilaterally. GI: + RUQ tenderness, non-distended  MS: No edema; No deformity. Neuro:  Nonfocal  Psych: Normal affect   Labs    High Sensitivity Troponin:  No results for input(s): TROPONINIHS in the last 720 hours.    Chemistry Recent Labs  Lab 05/13/19 0555 05/14/19 0246 05/15/19 0503  NA 135 135 135  K 4.0 3.5 3.8  CL 87* 91* 97*  CO2 36* 32 30  GLUCOSE 209* 206* 172*  BUN 24* 26* 22*  CREATININE 1.03* 1.12* 0.90  CALCIUM 9.4 9.0 9.1  GFRNONAA >60 56* >60  GFRAA >60 >60 >60  ANIONGAP 12 12 8      Hematology Recent Labs  Lab 05/13/19 0555 05/14/19 0246 05/15/19 0503  WBC 9.4 8.4 8.8  RBC 4.55 4.25 4.12  HGB 14.5 13.7 13.3  HCT 44.1 41.0 40.3  MCV 96.9 96.5 97.8  MCH 31.9 32.2 32.3  MCHC 32.9 33.4 33.0  RDW 18.2* 18.0* 18.1*  PLT 250 244 217    BNPNo results for input(s): BNP, PROBNP in the last 168  hours.   DDimer No results for input(s): DDIMER in the last 168 hours.   Radiology    No results found.  Cardiac Studies   Echocardiogram 01/2019: IMPRESSIONS   1. Severe hypokinesis of the left ventricular, basal-mid inferoseptal wall, inferior wall and inferolateral wall. 2. The left ventricle has mild-moderately reduced systolic function, with an ejection fraction of 40-45%. The cavity size was normal. Left ventricular diastolic Doppler parameters are consistent with pseudonormal. Elevated mean left atrial pressure. 3. The right ventricle has mildly reduced systolic function. The cavity was normal. There is no increase in right ventricular wall thickness. Right ventricular systolic pressure is moderately elevated with an estimated pressure of 58 mmHg. 4. Mitral valve regurgitation is mild to moderate by color flow Doppler. The MR jet is centrally-directed. 5. The inferior vena cava was dilated in  size with >50% respiratory variability. 6. Left atrial size was moderately dilated. 7. Right atrial size was mildly dilated.  Right/Left heart cath 03/2018:  Severe native coronary artery disease with total occlusion of the proximal LAD, total occlusion of the proximal RCA, and patent circumflex with patent proximal stent with eccentric 50% proximal narrowing. Distal obtuse marginal branches are severely and diffusely diseased.  Bypass graft failure with total occlusion of SVG to RCA.  Patent LIMA to LAD.  Right coronary territory is supplied by collaterals from LAD and circumflex.  Left ventricular systolic dysfunction with EF 35 to 45%. Mid to distal inferior wall akinesis. Inferobasal and inferoapical wall severely hypokinetic. Left ventricular end-diastolic pressure elevated at 19 mmHg  Moderate to severe pulmonary hypertension  Patient Profile     53 y.o.femalewith PMH ofCAD (s/p CABG '07 and STEMI PCI '17), chronic respiratory failure 2/2 COPD requiring trach ('02), lupus anticoagulant with DVTs (on warfarin) and HFrEF (53%) with diastolic dysfunction who presents with abdominal pain, nausea, vomiting, found to have cholelithasis with suspected cholecytisis. Cardiology following for preop assessment and optimization for surgery.  Assessment & Plan    1. Preoperative assessment:High risk for open surgery w/ general anesthesia due to obesity, chronic respiratory failure with trach, lupus, recurrent DVTs, low functional status, CHF and CAD. Echo 01/2019 with severe hypokinesis of LV, basal-mid inferoseptal wall, inferior wall, and inferolateral wall, with EF 40-45% and mildly reduced RV systolic function. Her last ischemic evaluation was a R/LHC in 2019 with total occlusion of pLAD, pRCA, with patent LCx s/p prox stent (50% narrowing), occlusion of SVG to RCA and patent LIMA with L>R collaterals.   No complaints of recent CP. No utility in further cardiac testing.  Continue  conservative management of cholecystitis with IV antibiotics. If condition worsens, could consider percutaneous drainage.   2. Acute on chronic combined CHF: EF 40-45%.  Admit BNP >2000. CXR with interstitial edema.   Treated w/ IV diuretics>>>good diuresis with improved CXR 9/4    Lasix held 9/7 due to hypotension   1L out in UOP yesterday. I/Os Net neg 3.7L since admit  Creatinine trending down and WNL at 0.90 today  BP remains soft but stable in the 29J-242A systolic  No LEE on exam and pt w/ dyspnea, laying flat during examination w/o increased work of breathing   Continue to hold Lasix for now given soft BP  3. CAD: per above.   No CP.  Continue medical therapy   Statin,  blocker. No ASA due to chronic coumadin.    4. Cholecystitis:    On IV antibiotics and afebrile. WBC ct WNL.   Persistent RUQ pain>>HIDA scan  today  High risk for open surgery. May need perc drain.   Continue management per surgery and primary team  5. Anticoagulation:  on chronic coumadin for recurrent DVTs/lupus anticoagulant.   Coumadin held initially for possible surgery.   Coumadin resumed 9/6 w/ heparin bridge. Pharmacy following.   INR subtherapeutic at 1.1     For questions or updates, please contact [place] Please consult [email or web address] for contact info under        Signed, Lyda Jester, PA-C  05/15/2019, 7:43 AM    History and all data above reviewed.  Patient examined.  I agree with the findings as above.   No acute cardiovascular complaints this morning.  HIDA scan this morning negative The patient exam reveals COR:RRR  ,  Lungs: Decreased breath sounds with diffuse coarse crackles  ,  Abd: Positive bowel sounds, no rebound no guarding, Ext No edema  .  All available labs, radiology testing, previous records reviewed. Agree with documented assessment and plan.   PREOP:  If she were to require an invasive procedure/surgery she would not be a prohibitive risk.  She  would be at higher risk but there are no changes in therapy or further testing that would need to be done prior to surgery.  I talked to her about this today.    Good urine out put this admission. Holding Lasix now.    Jeneen Rinks [facility]  10:22 AM  05/15/2019

## 2019-05-15 NOTE — Progress Notes (Signed)
Physical Therapy Treatment Patient Details Name: Theresa [name] MRN: [id number] DOB: Nov 19, 1965 Today's Date: 05/15/2019    History of Present Illness Patient is a 53 y/o female who presents with RUQ pain, N/V, diarrhea. Found to have cholelithiasis with presumed cholecystitis as well as CHF exacerbation. PMH includes CAd s/p CABG, chronic respiratory failure due to COPD, trach dependent since 2002, lupus, HTN, DVT, DM, CHF,    PT Comments    Pt eager to get out of bed and out of room. Pt is limited in safe mobility by oxygen desaturation (see General Comments) in presence of decreased strength and endurance. Pt is mod I for bed mobility and min guard for transfers and ambulation of 250 feet with RW. D/c plans remain appropriate at this time. PT will continue to follow acutely.     Follow Up Recommendations  Home health PT;Supervision - Intermittent     Equipment Recommendations  None recommended by PT       Precautions / Restrictions Precautions Precautions: Fall Precaution Comments: trach, LUE oyster shell brace (broken humerus per report)(pt not wearing brace today) Restrictions Weight Bearing Restrictions: No    Mobility  Bed Mobility Overal bed mobility: Modified Independent             General bed mobility comments: Use of rail, HOB elevated.  Transfers Overall transfer level: Needs assistance Equipment used: None Transfers: Sit to/from Stand Sit to Stand: Min guard         General transfer comment: Min guard for safety able to self steady in standing  Ambulation/Gait Ambulation/Gait assistance: Min guard Gait Distance (Feet): 250 Feet Assistive device: IV Pole Gait Pattern/deviations: Step-through pattern;Decreased stride length Gait velocity: decreased Gait velocity interpretation: <1.8 ft/sec, indicate of risk for recurrent falls General Gait Details: min guard for safety with mildly unsteady gait holding onto IV pole for support; ambulated on 45% Fi02  10 L and Sp02 dropped to 73%, when O2 slipped off trach collar, with repositioning and 2 deep breaths SaO2 back to 93%O2. BP stable        Balance Overall balance assessment: Needs assistance;History of Falls Sitting-balance support: Feet supported;No upper extremity supported Sitting balance-Leahy Scale: Good Sitting balance - Comments: Assist to donn socks   Standing balance support: During functional activity Standing balance-Leahy Scale: Fair Standing balance comment: good static standing, does better with holding on to IV pole for dynamic activity                            Cognition Arousal/Alertness: Awake/alert Behavior During Therapy: WFL for tasks assessed/performed Overall Cognitive Status: Within Functional Limits for tasks assessed                                               Pertinent Vitals/Pain Pain Assessment: No/denies pain           PT Goals (current goals can now be found in the care plan section) Acute Rehab PT Goals Patient Stated Goal: to get better and get this surgery PT Goal Formulation: With patient Time For Goal Achievement: 05/28/19 Potential to Achieve Goals: Good Progress towards PT goals: Progressing toward goals    Frequency    Min 3X/week      PT Plan Current plan remains appropriate    Co-evaluation  AM-PAC PT "6 Clicks" Mobility   Outcome Measure  Help needed turning from your back to your side while in a flat bed without using bedrails?: None Help needed moving from lying on your back to sitting on the side of a flat bed without using bedrails?: A Little Help needed moving to and from a bed to a chair (including a wheelchair)?: A Little Help needed standing up from a chair using your arms (e.g., wheelchair or bedside chair)?: A Little Help needed to walk in hospital room?: A Little Help needed climbing 3-5 steps with a railing? : A Little 6 Click Score: 19    End of  Session Equipment Utilized During Treatment: Oxygen(45% Fi02, 10 L trach collar) Activity Tolerance: Patient tolerated treatment well Patient left: with call bell/phone within reach;in bed(sitting EoB set up for brushing teeth) Nurse Communication: Mobility status PT Visit Diagnosis: Difficulty in walking, not elsewhere classified (R26.2)     Time: 9373-4287 PT Time Calculation (min) (ACUTE ONLY): 32 min  Charges:  $Gait Training: 23-37 mins                     Theresa [name] B. Migdalia Dk PT, DPT Acute Rehabilitation Services Pager [phone] Office [phone]    Theresa [name] 05/15/2019, 3:26 PM

## 2019-05-16 DIAGNOSIS — Z86718 Personal history of other venous thrombosis and embolism: Secondary | ICD-10-CM

## 2019-05-16 LAB — BASIC METABOLIC PANEL
Anion gap: 9 (ref 5–15)
BUN: 14 mg/dL (ref 6–20)
CO2: 27 mmol/L (ref 22–32)
Calcium: 9.2 mg/dL (ref 8.9–10.3)
Chloride: 102 mmol/L (ref 98–111)
Creatinine, Ser: 0.75 mg/dL (ref 0.44–1.00)
GFR calc Af Amer: >60 mL/min (ref >60)
GFR calc non Af Amer: >60 mL/min (ref >60)
Glucose, Bld: 129 mg/dL — ABNORMAL HIGH (ref 70–99)
Potassium: 4 mmol/L (ref 3.5–5.1)
Sodium: 138 mmol/L (ref 135–145)

## 2019-05-16 LAB — GLUCOSE, CAPILLARY
Glucose-Capillary: 111 mg/dL — ABNORMAL HIGH (ref 70–99)
Glucose-Capillary: 190 mg/dL — ABNORMAL HIGH (ref 70–99)
Glucose-Capillary: 184 mg/dL — ABNORMAL HIGH (ref 70–99)
Glucose-Capillary: 191 mg/dL — ABNORMAL HIGH (ref 70–99)

## 2019-05-16 LAB — MAGNESIUM: Magnesium: 1.8 mg/dL (ref 1.7–2.4)

## 2019-05-16 LAB — CBC
HCT: 38.9 % (ref 36.0–46.0)
Hemoglobin: 12.7 g/dL (ref 12.0–15.0)
MCH: 32.3 pg (ref 26.0–34.0)
MCHC: 32.6 g/dL (ref 30.0–36.0)
MCV: 99 fL (ref 80.0–100.0)
Platelets: 195 10*3/uL (ref 150–400)
RBC: 3.93 MIL/uL (ref 3.87–5.11)
RDW: 17.9 % — ABNORMAL HIGH (ref 11.5–15.5)
WBC: 8 10*3/uL (ref 4.0–10.5)
nRBC: 0 % (ref 0.0–0.2)

## 2019-05-16 LAB — HEPARIN LEVEL (UNFRACTIONATED): Heparin Unfractionated: 0.74 IU/mL — ABNORMAL HIGH (ref 0.30–0.70)

## 2019-05-16 LAB — PROTIME-INR
INR: 1.2 (ref 0.8–1.2)
Prothrombin Time: 14.9 seconds (ref 11.4–15.2)

## 2019-05-16 MED ORDER — WARFARIN SODIUM 7.5 MG PO TABS
7.5000 mg | ORAL_TABLET | Freq: Once | ORAL | Status: AC
  Administered 2019-05-16: 19:00:00 7.5 mg via ORAL
  Filled 2019-05-16: qty 1

## 2019-05-16 NOTE — Care Management (Signed)
05-16-19 1631 Benefits Check submitted for Lovenox. CM will follow for cost. [name], RN,BSN Case Manager [phone]

## 2019-05-16 NOTE — Progress Notes (Signed)
PROGRESS NOTE  Theresa [name] VQQ:[id number] DOB: May 02, 1966   PCP: Monico Blitz, MD  Patient is from: home  DOA: 05/07/2019 LOS: 9  Brief Narrative / Interim history: 53 year old female with history of CAD/CABG, chronic RF/COPD/trach dependent since 2002, previous PEA arrest, DVT and lupus anticoagulant on warfarin, HTN, IDDM-2 and morbid obesity presenting with epigastric and RUQ abdominal pain, nausea, vomiting and diarrhea for 2 days and found to have cholelithiasis but no cholecystitis on RUQ ultrasound.  HIDA scan negative.  Clinically treated with IV antibiotics for cholecystitis given prolonged RUQ pain. Evaluated by general surgery who thought patient is high risk for surgery. She was also treated for acute on chronic respiratory failure likely due to systolic CHF exacerbation with significant improvement in her breathing.  Subjective: No major events overnight of this morning.  She says his shortness of breath is much much better.  He RUQ pain is better as well.  Denies chest pain, orthopnea or PND.  Denies nausea or vomiting.  Denies bowel or bladder issue.  Objective: Vitals:   05/16/19 0646 05/16/19 0823 05/16/19 1149 05/16/19 1411  BP: (!) 100/53   107/61  Pulse: 71 74 77   Resp: 17 16 13    Temp:    98.2 F (36.8 C)  TempSrc:    Oral  SpO2: 98% 100% 98%   Weight: 89.9 kg     Height:        Intake/Output Summary (Last 24 hours) at 05/16/2019 1520 Last data filed at 05/16/2019 0948 Gross per 24 hour  Intake 1900.36 ml  Output 800 ml  Net 1100.36 ml   Filed Weights   05/14/19 0500 05/15/19 0436 05/16/19 0646  Weight: 89 kg 89 kg 89.9 kg    Examination:  GENERAL: No acute distress.  Appears well.  HEENT: MMM.  Vision and hearing grossly intact.  NECK: Supple.  Trach in place.  Difficult to assess JVD. RESP:  No IWOB.  Fair air movement bilaterally. CVS:  RRR. Heart sounds normal.  ABD/GI/GU: Bowel sounds present. Soft.  RUQ tenderness. MSK/EXT:  Moves  extremities. No apparent deformity or edema.  SKIN: no apparent skin lesion or wound NEURO: Awake, alert and oriented appropriately.  No gross deficit.  PSYCH: Calm. Normal affect.   Assessment & Plan: Abdominal pain/nausea/vomiting Cholelithiasis -RUQ ultrasound revealed cholelithiasis without cholecystitis -HIDA scan negative. -High risk for lap chole per general surgery, cardiology and pulmonology -Completed empiric course of IV cefepime and Flagyl for 10 days -General surgery recommended considering percutaneous cholecystostomy if pain worse -Previous attending discussed about the drain with IR who recommended HIDA which is negative -Pain has improved.  No nausea or vomiting.  Acute on chronic respiratory failure with hypoxia: Likely due to CHF. On 3 to 4 L at baseline Acute on chronic combined CHF: Echo in 5/20 with EF 40 to 45%, severe hypokinesis and  DD.  BNP greater than 2000 on admission.  CXR with interstitial edema. -Diuresed with IV Lasix which were held on 9/7 due to hypotension. -Respiratory symptoms improved.  -1 L / 24 hours.  -3 L since admission -Diuretics per cardiology. -Daily weight, intake output and renal function   COPD/chronic respiratory failure/tracheostomy status -Pulmonary toilet, nebulizer per pulmonology -Trach change on 9/6-#4 cuffless  History of DVT/lupus anticoagulant -INR subtherapeutic at 1.2. -Warfarin with heparin bridge  Hypokalemia/hypomagnesemia: Resolved. -Recheck in the morning  IDDM-2: CBG was in fair range. -Continue current regimen -Continue gabapentin  Anxiety/depression: Stable. -Continue home Zoloft  Essential hypertension -Continue losartan  and metoprolol  History of CAD/CABG -Continue beta-blocker, statin  Morbid obesity: BMI 41.44 -Encourage lifestyle change to lose weight  Debility/deconditioning -PT/OT eval-HH PT/OT.  DVT prophylaxis: Heparin and warfarin Code Status: Full code Family Communication: Patient  and/or RN. Available if any question.  Disposition Plan: Remains inpatient.  INR subtherapeutic.  She is on warfarin with heparin bridge.  Also need to be cleared by cardiology from CHF standpoint.  Final disposition home with HH. Consultants: General surgery, pulmonology, cardiology, IR  Procedures:  None  Microbiology summarized: SARS cough 2 screen negative.  Antimicrobials: Anti-infectives (From admission, onward)   Start     Dose/Rate Route Frequency Ordered Stop   05/07/19 1630  ceFEPIme (MAXIPIME) 2 g in sodium chloride 0.9 % 100 mL IVPB  Status:  Discontinued     2 g 200 mL/hr over 30 Minutes Intravenous Every 8 hours 05/07/19 1612 05/16/19 1156   05/07/19 1600  metroNIDAZOLE (FLAGYL) IVPB 500 mg  Status:  Discontinued     500 mg 100 mL/hr over 60 Minutes Intravenous Every 8 hours 05/07/19 1548 05/16/19 1156      Sch Meds:  Scheduled Meds: . atorvastatin  20 mg Oral Daily  . famotidine  20 mg Oral Daily  . gabapentin  300 mg Oral BID  . gabapentin  600 mg Oral QHS  . influenza vac split quadrivalent PF  0.5 mL Intramuscular Tomorrow-1000  . insulin aspart  0-20 Units Subcutaneous TID WC  . insulin aspart  5 Units Subcutaneous TID WC  . insulin glargine  33 Units Subcutaneous q morning - 10a  . losartan  25 mg Oral Daily  . metoprolol succinate  25 mg Oral Daily  . pantoprazole  40 mg Oral Daily  . sertraline  50 mg Oral Daily  . sodium chloride flush  3 mL Intravenous Q12H  . tamsulosin  0.4 mg Oral Daily  . warfarin  7.5 mg Oral ONCE-1800  . Warfarin - Pharmacist Dosing Inpatient   Does not apply q1800   Continuous Infusions: . sodium chloride 500 mL (05/08/19 0150)  . heparin 1,000 Units/hr (05/16/19 1145)   PRN Meds:.sodium chloride, acetaminophen **OR** acetaminophen, albuterol, HYDROcodone-acetaminophen, LORazepam, methocarbamol, morphine injection, ondansetron **OR** ondansetron (ZOFRAN) IV   I have personally reviewed the following labs and images: CBC:  Recent Labs  Lab 05/12/19 0503 05/13/19 0555 05/14/19 0246 05/15/19 0503 05/16/19 0356  WBC 8.4 9.4 8.4 8.8 8.0  HGB 14.1 14.5 13.7 13.3 12.7  HCT 42.7 44.1 41.0 40.3 38.9  MCV 96.6 96.9 96.5 97.8 99.0  PLT 251 250 244 217 195   BMP &GFR Recent Labs  Lab 05/11/19 0426 05/12/19 0503 05/13/19 0555 05/14/19 0246 05/15/19 0503 05/16/19 0356  NA 136 134* 135 135 135 138  K 3.1* 3.9 4.0 3.5 3.8 4.0  CL 88* 86* 87* 91* 97* 102  CO2 33* 36* 36* 32 30 27  GLUCOSE 183* 161* 209* 206* 172* 129*  BUN 19 21* 24* 26* 22* 14  CREATININE 0.77 0.87 1.03* 1.12* 0.90 0.75  CALCIUM 9.8 10.2 9.4 9.0 9.1 9.2  MG 1.5*  --  1.6* 1.8 1.8 1.8   Estimated Creatinine Clearance: 77.7 mL/min (by C-G formula based on SCr of 0.75 mg/dL). Liver & Pancreas: Recent Labs  Lab 05/15/19 0503  AST 28  ALT 18  ALKPHOS 85  BILITOT 0.8  PROT 6.4*  ALBUMIN 2.8*   No results for input(s): LIPASE, AMYLASE in the last 168 hours. No results for input(s): AMMONIA in the  last 168 hours. Diabetic: No results for input(s): HGBA1C in the last 72 hours. Recent Labs  Lab 05/15/19 1240 05/15/19 1712 05/15/19 2127 05/16/19 0812 05/16/19 1141  GLUCAP 79 146* 225* 111* 190*   Cardiac Enzymes: No results for input(s): CKTOTAL, CKMB, CKMBINDEX, TROPONINI in the last 168 hours. No results for input(s): PROBNP in the last 8760 hours. Coagulation Profile: Recent Labs  Lab 05/14/19 0246 05/15/19 0503 05/16/19 0356  INR 1.1 1.1 1.2   Thyroid Function Tests: No results for input(s): TSH, T4TOTAL, FREET4, T3FREE, THYROIDAB in the last 72 hours. Lipid Profile: No results for input(s): CHOL, HDL, LDLCALC, TRIG, CHOLHDL, LDLDIRECT in the last 72 hours. Anemia Panel: No results for input(s): VITAMINB12, FOLATE, FERRITIN, TIBC, IRON, RETICCTPCT in the last 72 hours. Urine analysis:    Component Value Date/Time   COLORURINE AMBER (A) 05/08/2019 0405   APPEARANCEUR HAZY (A) 05/08/2019 0405   LABSPEC 1.040 (H)  05/08/2019 0405   PHURINE 5.0 05/08/2019 0405   GLUCOSEU 50 (A) 05/08/2019 0405   HGBUR NEGATIVE 05/08/2019 0405   BILIRUBINUR SMALL (A) 05/08/2019 0405   KETONESUR 5 (A) 05/08/2019 0405   PROTEINUR >=300 (A) 05/08/2019 0405   NITRITE NEGATIVE 05/08/2019 0405   LEUKOCYTESUR NEGATIVE 05/08/2019 0405   Sepsis Labs: Invalid input(s): PROCALCITONIN, [place]  Microbiology: Recent Results (from the past 240 hour(s))  SARS Coronavirus 2 [facility] order, Performed in [facility] hospital lab) Nasopharyngeal Nasopharyngeal Swab     Status: None   Collection Time: 05/07/19  3:59 PM   Specimen: Nasopharyngeal Swab  Result Value Ref Range Status   SARS Coronavirus 2 NEGATIVE NEGATIVE Final    Comment: (NOTE) If result is NEGATIVE SARS-CoV-2 target nucleic acids are NOT DETECTED. The SARS-CoV-2 RNA is generally detectable in upper and lower  respiratory specimens during the acute phase of infection. The lowest  concentration of SARS-CoV-2 viral copies this assay can detect is 250  copies / mL. A negative result does not preclude SARS-CoV-2 infection  and should not be used as the sole basis for treatment or other  patient management decisions.  A negative result may occur with  improper specimen collection / handling, submission of specimen other  than nasopharyngeal swab, presence of viral mutation(s) within the  areas targeted by this assay, and inadequate number of viral copies  (<250 copies / mL). A negative result must be combined with clinical  observations, patient history, and epidemiological information. If result is POSITIVE SARS-CoV-2 target nucleic acids are DETECTED. The SARS-CoV-2 RNA is generally detectable in upper and lower  respiratory specimens dur ing the acute phase of infection.  Positive  results are indicative of active infection with SARS-CoV-2.  Clinical  correlation with patient history and other diagnostic information is  necessary to determine patient  infection status.  Positive results do  not rule out bacterial infection or co-infection with other viruses. If result is PRESUMPTIVE POSTIVE SARS-CoV-2 nucleic acids MAY BE PRESENT.   A presumptive positive result was obtained on the submitted specimen  and confirmed on repeat testing.  While 2019 novel coronavirus  (SARS-CoV-2) nucleic acids may be present in the submitted sample  additional confirmatory testing may be necessary for epidemiological  and / or clinical management purposes  to differentiate between  SARS-CoV-2 and other Sarbecovirus currently known to infect humans.  If clinically indicated additional testing with an alternate test  methodology [phone]) is advised. The SARS-CoV-2 RNA is generally  detectable in upper and lower respiratory sp ecimens during the acute  phase of infection. The expected result is Negative. Fact Sheet for Patients:  StrictlyIdeas.no Fact Sheet for Healthcare Providers: BankingDealers.co.za This test is not yet approved or cleared by the [place] FDA and has been authorized for detection and/or diagnosis of SARS-CoV-2 by FDA under an Emergency Use Authorization (EUA).  This EUA will remain in effect (meaning this test can be used) for the duration of the COVID-19 declaration under Section 564(b)(1) of the Act, 21 U.S.C. section 360bbb-3(b)(1), unless the authorization is terminated or revoked sooner. Performed at [facility], [place] [address] [place], [place] [address]     Radiology Studies: No results found.  35 minutes with more than 50% spent in reviewing records, counseling patient and coordinating care.  Caedan Sumler T. [place]  If 7PM-7AM, please contact night-coverage [email or web address] Password [email or web address] 05/16/2019, 3:20 PM

## 2019-05-16 NOTE — Progress Notes (Signed)
[place] for Heparin, coumadin Indication: hx of DVT  Allergies  Allergen Reactions  . Penicillins Rash    Has patient had a PCN reaction causing immediate rash, facial/tongue/throat swelling, SOB or lightheadedness with hypotension: Yes Has patient had a PCN reaction causing severe rash involving mucus membranes or skin necrosis: No Has patient had a PCN reaction that required hospitalization No Has patient had a PCN reaction occurring within the last 10 years: No If all of the above answers are "NO", then may proceed with Cephalosporin use.   REACTION: rash  . Ciprofloxacin     nausea  . Ibuprofen Rash    swelling in leg     Patient Measurements: Height: 4\' 10"  (147.3 cm) Weight: 198 lb 4.8 oz (89.9 kg) IBW/kg (Calculated) : 40.9 Heparin Dosing Weight: 62.5kg  Vital Signs: BP: 100/53 (09/09 0646) Pulse Rate: 71 (09/09 0646)  Labs: Recent Labs    05/14/19 0246  05/14/19 2006 05/15/19 0503 05/16/19 0356  HGB 13.7  --   --  13.3 12.7  HCT 41.0  --   --  40.3 38.9  PLT 244  --   --  217 195  LABPROT 13.9  --   --  13.7 14.9  INR 1.1  --   --  1.1 1.2  HEPARINUNFRC 0.75*   < > 0.59 0.54 0.74*  CREATININE 1.12*  --   --  0.90 0.75   < > = values in this interval not displayed.    Estimated Creatinine Clearance: 77.7 mL/min (by C-G formula based on SCr of 0.75 mg/dL).  Assessment: 53 y.o. female with h/o DVT and lupus on Coumadin PTA.  Pt currently on heparin as well awaiting therapeutic INR with Coumadin.  She is noted on flagyl -Heparin level is supratherapeutic at 0.75 on 1100 units/hr.  PTA warfarin dose 2.5 mg daily Goal of Therapy:  Heparin level 0.3-0.7 units/ml Monitor platelets by anticoagulation protocol: Yes    Plan:  Increase Coumadin dose to 7.5mg  today Decrease heparin dose to 1000 units/hr. Daily heparin level, CBC and INR  Adella Hare, PharmD Candidate

## 2019-05-16 NOTE — Plan of Care (Signed)

## 2019-05-17 DIAGNOSIS — R791 Abnormal coagulation profile: Secondary | ICD-10-CM

## 2019-05-17 LAB — CBC
HCT: 37.7 % (ref 36.0–46.0)
Hemoglobin: 12.2 g/dL (ref 12.0–15.0)
MCH: 32.4 pg (ref 26.0–34.0)
MCHC: 32.4 g/dL (ref 30.0–36.0)
MCV: 100.3 fL — ABNORMAL HIGH (ref 80.0–100.0)
Platelets: 187 10*3/uL (ref 150–400)
RBC: 3.76 MIL/uL — ABNORMAL LOW (ref 3.87–5.11)
RDW: 18 % — ABNORMAL HIGH (ref 11.5–15.5)
WBC: 7.2 10*3/uL (ref 4.0–10.5)
nRBC: 0 % (ref 0.0–0.2)

## 2019-05-17 LAB — COMPREHENSIVE METABOLIC PANEL
ALT: 21 U/L (ref 0–44)
AST: 31 U/L (ref 15–41)
Albumin: 2.5 g/dL — ABNORMAL LOW (ref 3.5–5.0)
Alkaline Phosphatase: 83 U/L (ref 38–126)
Anion gap: 9 (ref 5–15)
BUN: 13 mg/dL (ref 6–20)
CO2: 25 mmol/L (ref 22–32)
Calcium: 8.7 mg/dL — ABNORMAL LOW (ref 8.9–10.3)
Chloride: 104 mmol/L (ref 98–111)
Creatinine, Ser: 0.72 mg/dL (ref 0.44–1.00)
GFR calc Af Amer: >60 mL/min (ref >60)
GFR calc non Af Amer: >60 mL/min (ref >60)
Glucose, Bld: 256 mg/dL — ABNORMAL HIGH (ref 70–99)
Potassium: 4.3 mmol/L (ref 3.5–5.1)
Sodium: 138 mmol/L (ref 135–145)
Total Bilirubin: 0.7 mg/dL (ref 0.3–1.2)
Total Protein: 5.8 g/dL — ABNORMAL LOW (ref 6.5–8.1)

## 2019-05-17 LAB — GLUCOSE, CAPILLARY
Glucose-Capillary: 175 mg/dL — ABNORMAL HIGH (ref 70–99)
Glucose-Capillary: 146 mg/dL — ABNORMAL HIGH (ref 70–99)

## 2019-05-17 LAB — PROTIME-INR
INR: 1.5 — ABNORMAL HIGH (ref 0.8–1.2)
Prothrombin Time: 18 seconds — ABNORMAL HIGH (ref 11.4–15.2)

## 2019-05-17 LAB — MAGNESIUM: Magnesium: 1.7 mg/dL (ref 1.7–2.4)

## 2019-05-17 LAB — HEPARIN LEVEL (UNFRACTIONATED): Heparin Unfractionated: 0.65 IU/mL (ref 0.30–0.70)

## 2019-05-17 MED ORDER — TORSEMIDE 20 MG PO TABS: 60.0000 mg | ORAL_TABLET | Freq: Every day | ORAL | 1 refills | Status: DC

## 2019-05-17 MED ORDER — WARFARIN SODIUM 2.5 MG PO TABS: ORAL_TABLET | ORAL | Status: DC

## 2019-05-17 MED FILL — TORSEMIDE 20 MG TABLET: 20 | 60 days supply | Qty: 180 | Fill #0

## 2019-05-17 NOTE — Care Management (Signed)
Per Domique C. W/[employer] co-pay amount for Generic brand of Lovenox (enoxparin 162m.q12 ) 5 - 30 day supply  $5.25, at a preferred pharmacy, [facility]  Deductible has been met. No PA required

## 2019-05-17 NOTE — Care Management Important Message (Signed)
Important Message  Patient Details  Name: Theresa [name] MRN: [id number] Date of Birth: [birth year]   Medicare Important Message Given:  Yes     [name], RN 05/17/2019, 10:31 AM

## 2019-05-17 NOTE — TOC Initial Note (Signed)
Transition of Care [facility]) - Initial/Assessment Note    Patient Details  Name: Theresa [name] MRN: [id number] Date of Birth: Oct 17, 1965  [facility] CM/SW Contact:    [name], RN Phone Number: 05/17/2019, 12:26 PM  Clinical Narrative:   Pt presented for Sepsis. PTA from home with spouse. Has trach- pot has DME 02 via [employer]. Patient is agreeable to [place] to begin within 24-48 hours post transition home. CM did reach out to [employer] to discuss portable 02 tank. Respiratory from [employer] will contact and discuss with patient. Patient aware that if she does receive call to call them- number on face sheet.                 Expected Discharge Plan: [place] Barriers to Discharge: No Barriers Identified   Patient Goals and CMS Choice Patient states their goals for this hospitalization and ongoing recovery are:: "to return home"   Choice offered to / list presented to : Patient  Expected Discharge Plan and Services Expected Discharge Plan: [place] In-house Referral: NA Discharge Planning Services: CM Consult Post Acute Care Choice: [place] arrangements for the past 2 months: Single Family Home Expected Discharge Date: 05/17/19                         HH Arranged: RN, Disease Management, PT [place] Agency: [facility] [employer]) Date [place]: 05/17/19 Time HH Agency Contacted: 64 Representative spoke with at [place]: [place]  Prior Living Arrangements/Services Living arrangements for the past 2 months: [place] with:: Spouse Patient language and need for interpreter reviewed:: No        Need for Family Participation in Patient Care: Yes (Comment) Care giver support system in place?: Yes (comment) Current home services: DME(Pt has DME 02, 3n1,) Criminal Activity/Legal Involvement Pertinent to Current Situation/Hospitalization: No - Comment as  needed  Activities of Daily Living Home Assistive Devices/Equipment: CBG Meter ADL Screening (condition at time of admission) Patient's cognitive ability adequate to safely complete daily activities?: Yes Is the patient deaf or have difficulty hearing?: No Does the patient have difficulty seeing, even when wearing glasses/contacts?: No Does the patient have difficulty concentrating, remembering, or making decisions?: No Patient able to express need for assistance with ADLs?: Yes Does the patient have difficulty dressing or bathing?: No Independently performs ADLs?: Yes (appropriate for developmental age) Does the patient have difficulty walking or climbing stairs?: Yes Weakness of Legs: Both Weakness of Arms/Hands: None  Permission Sought/Granted Permission sought to share information with : Facility Sport and exercise psychologist, Family Supports Permission granted to share information with : Yes, Verbal Permission Granted     Permission granted to share info w AGENCY: [facility] and [facility]        Emotional Assessment Appearance:: Appears stated age Attitude/Demeanor/Rapport: Engaged Affect (typically observed): Accepting Orientation: : Oriented to Place, Oriented to  Time, Oriented to Situation, Oriented to Self Alcohol / Substance Use: Not Applicable Psych Involvement: No (comment)  Admission diagnosis:  RUQ pain [R10.11] Right upper quadrant abdominal pain [R10.11] Cholelithiasis [K80.20] Patient Active Problem List   Diagnosis Date Noted  . Cholelithiasis 05/07/2019  . Adult body mass index 50.0-59.9 ([place]) 03/22/2019  . Anxiety 03/22/2019  . Low back pain 03/22/2019  . Cigarette nicotine dependence without complication 58/52/7782  . History of vitamin D deficiency 03/22/2019  . Insomnia 03/22/2019  .  Knee pain 03/22/2019  . Localized, primary osteoarthritis of lower leg, unspecified laterality 03/22/2019  . Neck pain 03/22/2019  . Psoriasis 03/22/2019  .  Thrombophlebitis of deep veins of lower extremity ([place]) 03/22/2019  . Deep vein thrombosis ([place]) 03/22/2019  . Fracture of humeral shaft, left, closed 03/22/2019  . Chronic respiratory insufficiency 01/19/2019  . CAD (coronary artery disease) 01/15/2019  . Educated About Covid-19 Virus Infection 01/12/2019  . Acute on chronic combined systolic and diastolic HF (heart failure) ([place]) 01/12/2019  . Acute on chronic respiratory failure with hypoxia ([place]) 10/24/2018  . Pulmonary hypertension, unspecified ([place])   . Acute on chronic respiratory failure with hypoxia ([place]) 08/01/2017  . Uncontrolled type 2 diabetes mellitus with hyperglycemia, with long-term current use of insulin ([place]) 08/01/2017  . Dyspnea 07/31/2017  . Type 2 diabetes mellitus with hyperglycemia ([place]) 01/22/2016  . CAD (coronary artery disease) of artery bypass graft: PTCA/DES to distal body VG to PDA 10/29/15 11/03/2015  . Tracheostomy in place [facility] chronic since 2002 11/03/2015  . Hypokalemia 11/03/2015  . Lupus anticoagulant syndrome ([place])   . Chronic diastolic CHF (congestive heart failure) ([place])   . Respiratory failure ([place])   . Coronary artery disease involving coronary bypass graft of native heart without angina pectoris   . OSA (obstructive sleep apnea)   . COPD exacerbation ([place])   . Tobacco abuse   . DM (diabetes mellitus) ([place]) 06/11/2009  . Hyperlipidemia LDL goal <70 06/11/2009  . DVT 06/11/2009  . History of CABG 06/11/2009  . Essential hypertension 05/27/2009  . CAD, NATIVE VESSEL 05/27/2009   PCP:  Monico Blitz, MD Pharmacy:   [facility] [address] [place] [address] Phone: [phone] Fax: [phone]  [facility] [place] [place] - [address] [place] [address] Phone: [phone] Fax: [phone]     Social Determinants of Health (SDOH) Interventions    Readmission Risk  Interventions Readmission Risk Prevention Plan 05/17/2019  Transportation Screening Complete  PCP or Specialist Appt within 3-5 Days Complete  HRI or [place] Complete  Social Work Consult for [place] Planning/Counseling Complete  Palliative Care Screening Not Applicable  Medication Review Press photographer) Complete  Some recent data might be hidden

## 2019-05-17 NOTE — Consult Note (Signed)
   [facility] CM Inpatient Consult   05/17/2019  Theresa [name] 07-Jun-1966 [id number]    Patient screened for possible needs of [name] care management services with her Medicare/ [employer] plan/ benefits and 25% high risk score for unplanned readmission and hospitalizations.   Per chart reviewand MD brief narrative on 05/16/19 reveal as follows:    53 year old female with history of CAD/CABG, chronic RF/COPD/trach dependent since 2002, previous PEA arrest, DVT and lupus anticoagulant on warfarin, HTN, IDDM-2 and morbid obesity presenting with epigastric and RUQ abdominal pain, nausea, vomiting and diarrhea for 2 days and found to have cholelithiasis but no cholecystitis on RUQ ultrasound.  HIDA scan negative.  Clinically treated with IV antibiotics for cholecystitis given prolonged RUQ pain. Evaluated by general surgery who thought patient is high risk for surgery. She was also treated for acute on chronic respiratory failure likely due to systolic CHF exacerbation with significant improvement in her breathing.  Called and spoke with patient over the phone in her room. HIPAA verified. She reports that she will be transitioning home with home health services [name]). According to patient, she lives with her husband at home who provides support and assistance needed. Patient also mentioned that her mother and sister provides assistance with care when needed. She denies having any barriers with pharmacy (using [place], [place]) and transportation (per husband); but reports she have had previous issues of buying and affording DM medication (insulin shot -Tyler Aas) that costs around $900 per month. She mentioned that primary care provider gives her samples from the office when available. Her primary care provider is Dr. Monico Blitz with [facility]. She reports good understanding of HF, COPD and DM management at home. Patient is agreeable to [name] services and verbal consent obtained. She confirmed contact  number as [phone] (mobile) and address as: [address] [place], [place] [address].   Explained that [name] will not interfere or replace services provided by home health.   Outreach made with TOC CM to make aware aware of patient's concern regarding home oxygen and that patient will be followed by [name] post hospital discharge.  Referral made to [name] for medication assistance in affording diabetes medication Tyler Aas).  Of note, [name] services does not replace or interfere with any services that are arranged by transition of care case management or social work.   For questions and additional information, please contact:  [name], BSN, RN-BC [facility] Liaison Cell: [phone]

## 2019-05-17 NOTE — Progress Notes (Signed)
Pt was placed on 4L [place] per MD to prepare for discharge. Pt uses [place] for her home 02. Pt is tolerating at this time.

## 2019-05-17 NOTE — Discharge Summary (Signed)
Physician Discharge Summary  Theresa [name] TFT:[id number] DOB: [birth year] DOA: 05/07/2019  PCP: Monico Blitz, MD  Admit date: 05/07/2019 Discharge date: 05/17/2019  Admitted From: Home Disposition: Home  Recommendations for Outpatient Follow-up:  1. Follow up with PCP in 1 day for INR check 2. Please obtain INR/CBC 3. Please follow up on the following pending results: General surgery, pulmonology, cardiology, IR  Home Health: PT/OT/RN Equipment/Devices: None  Discharge Condition: Stable CODE STATUS: Full code  Hospital Course: 53 year old female with history of CAD/CABG, chronic RF/COPD/trach dependent since 2002, previous PEA arrest, DVT and lupus anticoagulant on warfarin, HTN, IDDM-2 and morbid obesity presenting with epigastric and RUQ abdominal pain, nausea, vomiting and diarrhea for 2 days and found to have cholelithiasis but no cholecystitis on RUQ ultrasound.  HIDA scan negative.  Clinically treated with IV antibiotics for cholecystitis given prolonged RUQ pain. Evaluated by general surgery who thought patient is high risk for surgery. She was also treated for acute on chronic respiratory failure likely due to systolic CHF exacerbation with significant improvement in her breathing.  Please see individual problem list below for more on hospital course.  Discharge Diagnoses:  Abdominal pain/nausea/vomiting likely due to cholelithiasis -RUQ ultrasound revealed cholelithiasis without cholecystitis -High risk for lap chole per general surgery, cardiology and pulmonology -General surgery recommended considering percutaneous cholecystostomy if pain worse -Previous attending discussed about the drain with IR who did not feel perc drain is indicated. -IR recommended HIDA which is negative -Completed empiric course of IV cefepime and Flagyl for 10 days -Pain has improved.  No nausea or vomiting resolved.  Acute on chronic respiratory failure with hypoxia: Likely due to CHF. On 3  to 4 L at baseline Acute on chronic combined CHF: Echo in 5/20 with EF 40 to 45%, severe hypokinesis and  DD.  BNP greater than 2000 on admission.  CXR with interstitial edema. -Diuresed with IV Lasix which were held on 9/7 due to hypotension. -Respiratory symptoms improved. -Discharged on torsemide 60 mg daily with an option to take additional dose in the afternoon. -Counseled on daily weight, fluid and sodium restriction.  COPD/chronic respiratory failure/tracheostomy status -Discharged on home oxygen -Patient has home health RN.  History of DVT/lupus anticoagulant: INR subtherapeutic at 1.5.  Patient is eager to go home.  She states she can arrange follow-up with PCP for INR check tomorrow.  Discussed with pharmacy and discharged on warfarin 5 mg tonight.  PCP to adjust dose after INR check tomorrow.  Hypokalemia/hypomagnesemia: Resolved. -Recheck BMP at follow-up.  IDDM-2: CBG was in fair range. -Discharged on home regimen.  Anxiety/depression: Stable. -Discharged on home Zoloft.  Essential hypertension: Normotensive. -Home losartan and metoprolol -Torsemide as above.  History of CAD/CABG: Stable.  No anginal symptoms. -Continue beta-blocker, statin  Morbid obesity: BMI 41.44 -Encourage lifestyle change to lose weight  Debility/deconditioning -Home health PT/OT/RN.  Discharge Instructions  Discharge Instructions    (HEART FAILURE PATIENTS) Call MD:  Anytime you have any of the following symptoms: 1) 3 pound weight gain in 24 hours or 5 pounds in 1 week 2) shortness of breath, with or without a dry hacking cough 3) swelling in the hands, feet or stomach 4) if you have to sleep on extra pillows at night in order to breathe.   Complete by: As directed    Call MD for:  difficulty breathing, headache or visual disturbances   Complete by: As directed    Call MD for:  persistant dizziness or light-headedness   Complete by:  As directed    Call MD for:  persistant  nausea and vomiting   Complete by: As directed    Call MD for:  severe uncontrolled pain   Complete by: As directed    Call MD for:  temperature >100.4   Complete by: As directed    Diet - low sodium heart healthy   Complete by: As directed    Diet Carb Modified   Complete by: As directed    Discharge instructions   Complete by: As directed    It has been a pleasure taking care of you! You were admitted with nausea, vomiting, abdominal pain and shortness of breath.  Nausea, vomiting and abdominal pain could be due to gallstone.  You were treated for possible infection with IV antibiotics.  With that, your symptoms improved.  Your shortness of breath improved with fluid medication.  We may have made adjustments to your home medications. Please review your new medication list and the directions before you take your medications. In regards to your warfarin, recommend taking 2 tablets (5 mg) tonight.  See your primary care doctor tomorrow for INR.  Your primary care doctor should make adjustment to your warfarin dose based on your INR. Please call your primary care office as soon as possible to schedule visit for INR check in 1 day.  You may schedule a separate hospital follow-up appointment in 1 to 2 weeks or as recommended by your primary care doctor.  Take care,   Increase activity slowly   Complete by: As directed      Allergies as of 05/17/2019      Reactions   Penicillins Rash   Has patient had a PCN reaction causing immediate rash, facial/tongue/throat swelling, SOB or lightheadedness with hypotension: Yes Has patient had a PCN reaction causing severe rash involving mucus membranes or skin necrosis: No Has patient had a PCN reaction that required hospitalization No Has patient had a PCN reaction occurring within the last 10 years: No If all of the above answers are "NO", then may proceed with Cephalosporin use. REACTION: rash   Ciprofloxacin    nausea   Ibuprofen Rash   swelling  in leg      Medication List    TAKE these medications   albuterol (2.5 MG/3ML) 0.083% nebulizer solution Commonly known as: PROVENTIL Take 3 mLs (2.5 mg total) by nebulization 2 (two) times daily. What changed:   when to take this  reasons to take this   atorvastatin 20 MG tablet Commonly known as: Lipitor Take 1 tablet (20 mg total) by mouth daily.   famotidine 20 MG tablet Commonly known as: PEPCID Take 20 mg by mouth daily.   gabapentin 300 MG capsule Commonly known as: NEURONTIN Take 1 capsule (300mg ) in the morning, 1 capsule (300mg ) in the afternoon, and 2 capsules (600mg ) at bedtime   HYDROcodone-acetaminophen 5-325 MG tablet Commonly known as: NORCO/VICODIN Take 1 tablet by mouth every 4 (four) hours as needed for moderate pain.   LORazepam 0.5 MG tablet Commonly known as: ATIVAN Take 0.5 mg by mouth every 6 (six) hours as needed for anxiety.   losartan 25 MG tablet Commonly known as: COZAAR Take 1 tablet (25 mg total) by mouth daily.   metoprolol succinate 25 MG 24 hr tablet Commonly known as: TOPROL-XL Take 25 mg by mouth daily.   nitroGLYCERIN 0.4 MG SL tablet Commonly known as: NITROSTAT Place 0.4 mg under the tongue every 5 (five) minutes as needed for chest pain.  Ozempic (0.25 or 0.5 MG/DOSE) 2 MG/1.5ML Sopn Generic drug: Semaglutide(0.25 or 0.5MG /DOS) Inject 0.5 mg into the skin once a week.   sertraline 50 MG tablet Commonly known as: ZOLOFT Take 50 mg by mouth daily.   tamsulosin 0.4 MG Caps capsule Commonly known as: FLOMAX Take 0.4 mg by mouth daily.   torsemide 20 MG tablet Commonly known as: DEMADEX Take 3 tablets (60 mg total) by mouth daily. May take additional 60 mg in the afternoon as needed for > 3 lbs weight gain What changed:   how much to take  how to take this  when to take this  additional instructions   TRESIBA FLEXTOUCH [place] Inject 30 Units into the skin every morning.   warfarin 2.5 MG tablet Commonly known  as: COUMADIN Take 2 tablets (5 mg) tonight at 6 PM.  Go to your PCP for INR tomorrow. What changed:   how much to take  how to take this  when to take this  additional instructions      Follow-up Information    Monico Blitz, MD. Schedule an appointment as soon as possible for a visit in 1 day(s).   Specialty: Internal Medicine Contact information: [address] [place] [address] [phone]        [name], MD. Schedule an appointment as soon as possible for a visit in 2 week(s).   Specialty: Cardiology Contact information: [address] [place] [address] [phone]           Consultations:  Cardiology, pulmonology, general surgery, IR  Procedures/Studies:  2D Echo: None  Dg Chest 2 View  Result Date: 05/11/2019 CLINICAL DATA:  CHF. EXAM: CHEST - 2 VIEW COMPARISON:  05/07/2019 FINDINGS: Tracheostomy tube overlies the airway. Cardiomegaly and prior CABG are again noted. Bilateral interstitial type lung opacities have decreased. Trace pleural effusions are questioned. Postsurgical changes are noted in the right lung base. No pneumothorax is identified. IMPRESSION: Decreased interstitial edema. Electronically Signed   By: Logan Bores M.D.   On: 05/11/2019 08:58   Dg Chest 2 View  Result Date: 05/07/2019 CLINICAL DATA:  Shortness of breath EXAM: CHEST - 2 VIEW COMPARISON:  Jan 12, 2019 FINDINGS: Again noted is a tracheostomy tube is at the level of the clavicular heads. There is increased interstitial markings with pulmonary vascular congestion. Blunting of the bilateral costophrenic angles, likely trace effusions. Again noted is marked cardiomegaly. IMPRESSION: Cardiomegaly with interstitial edema and trace bilateral pleural effusions. Electronically Signed   By: Prudencio Pair M.D.   On: 05/07/2019 13:20   Nm Hepatobiliary Liver Func  Result Date: 05/15/2019 CLINICAL DATA:  Upper abdominal pain EXAM: NUCLEAR MEDICINE HEPATOBILIARY IMAGING  TECHNIQUE: Sequential images of the abdomen were obtained out to 60 minutes following intravenous administration of radiopharmaceutical. RADIOPHARMACEUTICALS:  5.2 mCi Tc-36m  Choletec IV COMPARISON:  None. FINDINGS: Prompt uptake and biliary excretion of activity by the liver is seen. Gallbladder activity is visualized, consistent with patency of cystic duct. Biliary activity passes into small bowel, consistent with patent common bile duct. IMPRESSION: Study within normal limits. Electronically Signed   By: Lowella Grip III M.D.   On: 05/15/2019 13:10   Ct Renal Stone Study  Result Date: 04/23/2019 CLINICAL DATA:  53 year old female with acute RIGHT flank, abdominal and back pain for 1 week. EXAM: CT ABDOMEN AND PELVIS WITHOUT CONTRAST TECHNIQUE: Multidetector CT imaging of the abdomen and pelvis was performed following the standard protocol without IV contrast. COMPARISON:  11/21/2018 and prior  CTs FINDINGS: Please note that parenchymal and vascular abnormalities may be missed without intravenous contrast. Lower chest: Bibasilar scarring/atelectasis and ground-glass opacities are unchanged. Cardiomegaly again noted. Hepatobiliary: The liver is unremarkable. Cholelithiasis noted without CT evidence of acute cholecystitis. No biliary dilatation. Pancreas: Unremarkable Spleen: Unremarkable Adrenals/Urinary Tract: The kidneys, adrenal glands and bladder are unremarkable except for stable LEFT adrenal adenoma. There is no evidence of hydronephrosis or urinary calculi. Stomach/Bowel: Stomach is within normal limits. Appendix appears normal. No evidence of bowel wall thickening, distention, or inflammatory changes. Vascular/Lymphatic: Aortic atherosclerosis. No enlarged abdominal or pelvic lymph nodes. Reproductive: Uterus and bilateral adnexa are unremarkable. Other: No ascites,, focal collection or pneumoperitoneum. Musculoskeletal: No acute or suspicious bony abnormalities. IMPRESSION: 1. No acute  abnormality. No CT findings to suggest a cause for this patient's RIGHT abdominal pain. 2. Cholelithiasis without CT evidence of acute cholecystitis. 3. Cardiomegaly and chronic bibasilar pulmonary scarring/atelectasis/ground-glass opacities. 4.  Aortic Atherosclerosis (ICD10-I70.0). Electronically Signed   By: Margarette Canada M.D.   On: 04/23/2019 18:19   Xr Humerus Left  Result Date: 04/19/2019 2 view x-rays left humerus demonstrates refracture through the old fracture site without evidence of callus formation in comparison to 03/22/2019 images. Impression: persistent fracture proximal humerus shaft without evidence of radiographic union.  US Abdomen Limited Ruq  Result Date: 05/07/2019 CLINICAL DATA:  Right upper quadrant pain EXAM: ULTRASOUND ABDOMEN LIMITED RIGHT UPPER QUADRANT COMPARISON:  None. FINDINGS: Gallbladder: There is a shadowing gallstone present measuring 1 cm. No wall thickening visualized. No sonographic Murphy sign noted by sonographer. Common bile duct: Diameter: 3 mm Liver: No focal lesion identified. Within normal limits in parenchymal echogenicity. Portal vein is patent on color Doppler imaging with normal direction of blood flow towards the liver. Other: None. IMPRESSION: Cholelithiasis without evidence of acute cholecystitis Electronically Signed   By: Prudencio Pair M.D.   On: 05/07/2019 13:48     Subjective: No major events overnight of this morning.  Reports significant improvement in her abdominal pain and breathing.  Denies chest pain, nausea, vomiting or urinary symptoms.  She is very eager to go home.  She states her PCP usually monitors her INR.  She says she can arrange follow-up for INR checkup tomorrow.  Discharge Exam: Vitals:   05/17/19 0450 05/17/19 0825  BP:    Pulse: 85 78  Resp: 20 16  Temp:    SpO2: 95% 95%    GENERAL: No acute distress.  Appears well.  HEENT: MMM.  Vision and hearing grossly intact.  NECK: Supple.  Trach in place.  Difficult to assess  JVD. LUNGS:  No IWOB.  Fair air movement bilaterally. HEART:  RRR. Heart sounds normal.  Mild RUQ tenderness to deep palpation. ABD: Bowel sounds present. Soft. Non tender.  MSK/EXT:  Moves all extremities. No apparent deformity. No edema bilaterally. SKIN: no apparent skin lesion or wound NEURO: Awake, alert and oriented appropriately.  No gross deficit.  PSYCH: Calm. Normal affect.    The results of significant diagnostics from this hospitalization (including imaging, microbiology, ancillary and laboratory) are listed below for reference.     Microbiology: Recent Results (from the past 240 hour(s))  SARS Coronavirus 2 [facility] order, Performed in [facility] hospital lab) Nasopharyngeal Nasopharyngeal Swab     Status: None   Collection Time: 05/07/19  3:59 PM   Specimen: Nasopharyngeal Swab  Result Value Ref Range Status   SARS Coronavirus 2 NEGATIVE NEGATIVE Final    Comment: (NOTE) If result is NEGATIVE SARS-CoV-2 target  nucleic acids are NOT DETECTED. The SARS-CoV-2 RNA is generally detectable in upper and lower  respiratory specimens during the acute phase of infection. The lowest  concentration of SARS-CoV-2 viral copies this assay can detect is 250  copies / mL. A negative result does not preclude SARS-CoV-2 infection  and should not be used as the sole basis for treatment or other  patient management decisions.  A negative result may occur with  improper specimen collection / handling, submission of specimen other  than nasopharyngeal swab, presence of viral mutation(s) within the  areas targeted by this assay, and inadequate number of viral copies  (<250 copies / mL). A negative result must be combined with clinical  observations, patient history, and epidemiological information. If result is POSITIVE SARS-CoV-2 target nucleic acids are DETECTED. The SARS-CoV-2 RNA is generally detectable in upper and lower  respiratory specimens dur ing the acute phase of infection.   Positive  results are indicative of active infection with SARS-CoV-2.  Clinical  correlation with patient history and other diagnostic information is  necessary to determine patient infection status.  Positive results do  not rule out bacterial infection or co-infection with other viruses. If result is PRESUMPTIVE POSTIVE SARS-CoV-2 nucleic acids MAY BE PRESENT.   A presumptive positive result was obtained on the submitted specimen  and confirmed on repeat testing.  While 2019 novel coronavirus  (SARS-CoV-2) nucleic acids may be present in the submitted sample  additional confirmatory testing may be necessary for epidemiological  and / or clinical management purposes  to differentiate between  SARS-CoV-2 and other Sarbecovirus currently known to infect humans.  If clinically indicated additional testing with an alternate test  methodology [phone]) is advised. The SARS-CoV-2 RNA is generally  detectable in upper and lower respiratory sp ecimens during the acute  phase of infection. The expected result is Negative. Fact Sheet for Patients:  StrictlyIdeas.no Fact Sheet for Healthcare Providers: BankingDealers.co.za This test is not yet approved or cleared by the [place] FDA and has been authorized for detection and/or diagnosis of SARS-CoV-2 by FDA under an Emergency Use Authorization (EUA).  This EUA will remain in effect (meaning this test can be used) for the duration of the COVID-19 declaration under Section 564(b)(1) of the Act, 21 U.S.C. section 360bbb-3(b)(1), unless the authorization is terminated or revoked sooner. Performed at [facility], [place] [address], [place], [place] [address]      Labs: BNP (last 3 results) Recent Labs    10/14/18 2248 01/12/19 2009 05/07/19 1129  BNP 396.0* 834.6* 7,829.5*   Basic Metabolic Panel: Recent Labs  Lab 05/13/19 0555 05/14/19 0246 05/15/19 0503 05/16/19 0356 05/17/19  0350  NA 135 135 135 138 138  K 4.0 3.5 3.8 4.0 4.3  CL 87* 91* 97* 102 104  CO2 36* 32 30 27 25   GLUCOSE 209* 206* 172* 129* 256*  BUN 24* 26* 22* 14 13  CREATININE 1.03* 1.12* 0.90 0.75 0.72  CALCIUM 9.4 9.0 9.1 9.2 8.7*  MG 1.6* 1.8 1.8 1.8 1.7   Liver Function Tests: Recent Labs  Lab 05/15/19 0503 05/17/19 0350  AST 28 31  ALT 18 21  ALKPHOS 85 83  BILITOT 0.8 0.7  PROT 6.4* 5.8*  ALBUMIN 2.8* 2.5*   No results for input(s): LIPASE, AMYLASE in the last 168 hours. No results for input(s): AMMONIA in the last 168 hours. CBC: Recent Labs  Lab 05/13/19 0555 05/14/19 0246 05/15/19 0503 05/16/19 0356 05/17/19 0350  WBC 9.4  8.4 8.8 8.0 7.2  HGB 14.5 13.7 13.3 12.7 12.2  HCT 44.1 41.0 40.3 38.9 37.7  MCV 96.9 96.5 97.8 99.0 100.3*  PLT 250 244 217 195 187   Cardiac Enzymes: No results for input(s): CKTOTAL, CKMB, CKMBINDEX, TROPONINI in the last 168 hours. BNP: Invalid input(s): POCBNP CBG: Recent Labs  Lab 05/16/19 0812 05/16/19 1141 05/16/19 1626 05/16/19 2127 05/17/19 0803  GLUCAP 111* 190* 184* 191* 175*   D-Dimer No results for input(s): DDIMER in the last 72 hours. Hgb A1c No results for input(s): HGBA1C in the last 72 hours. Lipid Profile No results for input(s): CHOL, HDL, LDLCALC, TRIG, CHOLHDL, LDLDIRECT in the last 72 hours. Thyroid function studies No results for input(s): TSH, T4TOTAL, T3FREE, THYROIDAB in the last 72 hours.  Invalid input(s): FREET3 Anemia work up No results for input(s): VITAMINB12, FOLATE, FERRITIN, TIBC, IRON, RETICCTPCT in the last 72 hours. Urinalysis    Component Value Date/Time   COLORURINE AMBER (A) 05/08/2019 0405   APPEARANCEUR HAZY (A) 05/08/2019 0405   LABSPEC 1.040 (H) 05/08/2019 0405   PHURINE 5.0 05/08/2019 0405   GLUCOSEU 50 (A) 05/08/2019 0405   HGBUR NEGATIVE 05/08/2019 0405   BILIRUBINUR SMALL (A) 05/08/2019 0405   KETONESUR 5 (A) 05/08/2019 0405   PROTEINUR >=300 (A) 05/08/2019 0405   NITRITE  NEGATIVE 05/08/2019 0405   LEUKOCYTESUR NEGATIVE 05/08/2019 0405   Sepsis Labs Invalid input(s): PROCALCITONIN,  WBC,  LACTICIDVEN   Time coordinating discharge: 35 minutes  SIGNED:  Mercy Riding, MD  [facility] 05/17/2019, 9:39 AM  If 7PM-7AM, please contact night-coverage [email or web address] Password [email or web address]

## 2019-05-17 NOTE — Progress Notes (Signed)
[place] for Heparin, coumadin Indication: hx of DVT  Allergies  Allergen Reactions  . Penicillins Rash    Has patient had a PCN reaction causing immediate rash, facial/tongue/throat swelling, SOB or lightheadedness with hypotension: Yes Has patient had a PCN reaction causing severe rash involving mucus membranes or skin necrosis: No Has patient had a PCN reaction that required hospitalization No Has patient had a PCN reaction occurring within the last 10 years: No If all of the above answers are "NO", then may proceed with Cephalosporin use.   REACTION: rash  . Ciprofloxacin     nausea  . Ibuprofen Rash    swelling in leg     Patient Measurements: Height: 4\' 10"  (147.3 cm) Weight: 202 lb (91.6 kg) IBW/kg (Calculated) : 40.9 Heparin Dosing Weight: 62.5kg  Vital Signs: Temp: 97.8 F (36.6 C) (09/10 0446) Temp Source: Oral (09/10 0446) BP: 84/64 (09/10 0446) Pulse Rate: 78 (09/10 0825)  Labs: Recent Labs    05/15/19 0503 05/16/19 0356 05/17/19 0350  HGB 13.3 12.7 12.2  HCT 40.3 38.9 37.7  PLT 217 195 187  LABPROT 13.7 14.9 18.0*  INR 1.1 1.2 1.5*  HEPARINUNFRC 0.54 0.74* 0.65  CREATININE 0.90 0.75 0.72    Estimated Creatinine Clearance: 78.6 mL/min (by C-G formula based on SCr of 0.72 mg/dL).  Assessment: 53 y.o. female with h/o DVT and lupus on Coumadin PTA.  Pt currently on heparin as well as Coumadin.  She is noted on flagyl. -Heparin level is therapeutic at 0.65 on 1000 units/hr. -INR is subtherapeutic at 1.5  PTA warfarin dose 2.5 mg daily Goal of Therapy:  Heparin level 0.3-0.7 units/ml Monitor platelets by anticoagulation protocol: Yes    Plan:  -Decrease Coumadin dose to 5mg  today. Recheck INR with home health tomorrow (9/11). -D/c heparin.  Adella Hare, PharmD Candidate

## 2019-05-17 NOTE — Progress Notes (Signed)
Physical Therapy Treatment Patient Details Name: Theresa [name] MRN: [id number] DOB: 1965-12-23 Today's Date: 05/17/2019    History of Present Illness Patient is a 53 y/o female who presents with RUQ pain, N/V, diarrhea. Found to have cholelithiasis with presumed cholecystitis as well as CHF exacerbation. PMH includes CAd s/p CABG, chronic respiratory failure due to COPD, trach dependent since 2002, lupus, HTN, DVT, DM, CHF,    PT Comments    Pt is agreeable to stair training prior to d/c home this afternoon. Pt continues to c/o of abdominal bloating and pain especially in flexed position to sit up. Pt was just transitioned from trach collar O2 support to 4L O2 on [place] and is tolerating it well. With the exception of stair training pt able to maintain SaO2 93-97%O2 with stair climb SaO2 briefly dropped to 88% O2 but quickly recovered with pursed lip breathing. Pt able to climb 1 step x3 with min guard assist. D/c plans remain appropriate.      Follow Up Recommendations  Home health PT;Supervision - Intermittent     Equipment Recommendations  None recommended by PT       Precautions / Restrictions Precautions Precautions: Fall Precaution Comments: trach, on [place] now(pt not wearing brace today) Restrictions Weight Bearing Restrictions: No    Mobility  Bed Mobility Overal bed mobility: Modified Independent             General bed mobility comments: Use of rail, HOB elevated.  Transfers Overall transfer level: Needs assistance Equipment used: None Transfers: Sit to/from Stand Sit to Stand: Min guard         General transfer comment: Min guard for safety able to self steady in standing  Ambulation/Gait Ambulation/Gait assistance: Min guard Gait Distance (Feet): 350 Feet Assistive device: Rolling walker (2 wheeled) Gait Pattern/deviations: Step-through pattern;Decreased stride length Gait velocity: decreased Gait velocity interpretation: <1.31 ft/sec, indicative of  household ambulator General Gait Details: min guard for safety, discussed improved balance with use of RW, pt agreed   Stairs Stairs: Yes Stairs assistance: Supervision Stair Management: One rail Left;Backwards;Forwards Number of Stairs: 1(3x1) General stair comments: pt on IV fluids so climbed 1 step 3 times. 3/4 DoE and SaO2 drop to 88% on 4L via [place], able to quickly recover with pursed lipped breathing       Balance Overall balance assessment: Needs assistance;History of Falls Sitting-balance support: Feet supported;No upper extremity supported Sitting balance-Leahy Scale: Good Sitting balance - Comments: Assist to donn socks   Standing balance support: During functional activity Standing balance-Leahy Scale: Fair Standing balance comment: good static standing, does better with holding on to IV pole for dynamic activity                            Cognition Arousal/Alertness: Awake/alert Behavior During Therapy: WFL for tasks assessed/performed Overall Cognitive Status: Within Functional Limits for tasks assessed                                           General Comments General comments (skin integrity, edema, etc.): Pt moved from trach collar O2 support to 4L O2 via [place], with the exception of stair climbing pt able to maintain SaO2 93-97%O2      Pertinent Vitals/Pain Pain Assessment: 0-10 Pain Score: 6  Pain Location: abdominal pain Pain Descriptors / Indicators: Aching;Discomfort Pain Intervention(s): Limited  activity within patient's tolerance;Monitored during session;Repositioned           PT Goals (current goals can now be found in the care plan section) Acute Rehab PT Goals Patient Stated Goal: to get better and get this surgery PT Goal Formulation: With patient Time For Goal Achievement: 05/28/19 Potential to Achieve Goals: Good Progress towards PT goals: Progressing toward goals    Frequency    Min 3X/week      PT  Plan Current plan remains appropriate       AM-PAC PT "6 Clicks" Mobility   Outcome Measure  Help needed turning from your back to your side while in a flat bed without using bedrails?: None Help needed moving from lying on your back to sitting on the side of a flat bed without using bedrails?: A Little Help needed moving to and from a bed to a chair (including a wheelchair)?: A Little Help needed standing up from a chair using your arms (e.g., wheelchair or bedside chair)?: A Little Help needed to walk in hospital room?: A Little Help needed climbing 3-5 steps with a railing? : A Little 6 Click Score: 19    End of Session Equipment Utilized During Treatment: Oxygen Activity Tolerance: Patient tolerated treatment well Patient left: with call bell/phone within reach;in bed(sitting EoB ) Nurse Communication: Mobility status PT Visit Diagnosis: Difficulty in walking, not elsewhere classified (R26.2)     Time: 0315-9458 PT Time Calculation (min) (ACUTE ONLY): 18 min  Charges:  $Gait Training: 8-22 mins                     Miyeko Mahlum B. Migdalia Dk PT, DPT Acute Rehabilitation Services Pager [phone] Office [phone]    [place] 05/17/2019, 4:26 PM

## 2019-05-18 DIAGNOSIS — I10 Essential (primary) hypertension: Secondary | ICD-10-CM | POA: Diagnosis not present

## 2019-05-18 DIAGNOSIS — Z6841 Body Mass Index (BMI) 40.0 and over, adult: Secondary | ICD-10-CM | POA: Diagnosis not present

## 2019-05-18 DIAGNOSIS — Z299 Encounter for prophylactic measures, unspecified: Secondary | ICD-10-CM | POA: Diagnosis not present

## 2019-05-18 DIAGNOSIS — I5030 Unspecified diastolic (congestive) heart failure: Secondary | ICD-10-CM | POA: Diagnosis not present

## 2019-05-18 DIAGNOSIS — I5042 Chronic combined systolic (congestive) and diastolic (congestive) heart failure: Secondary | ICD-10-CM | POA: Diagnosis not present

## 2019-05-18 DIAGNOSIS — E1165 Type 2 diabetes mellitus with hyperglycemia: Secondary | ICD-10-CM | POA: Diagnosis not present

## 2019-05-18 DIAGNOSIS — I80209 Phlebitis and thrombophlebitis of unspecified deep vessels of unspecified lower extremity: Secondary | ICD-10-CM | POA: Diagnosis not present

## 2019-05-24 ENCOUNTER — Ambulatory Visit: Payer: Medicare Other | Admitting: Orthopaedic Surgery

## 2019-05-24 DIAGNOSIS — Z9981 Dependence on supplemental oxygen: Secondary | ICD-10-CM | POA: Diagnosis not present

## 2019-05-24 DIAGNOSIS — F419 Anxiety disorder, unspecified: Secondary | ICD-10-CM | POA: Diagnosis not present

## 2019-05-24 DIAGNOSIS — Z930 Tracheostomy status: Secondary | ICD-10-CM | POA: Diagnosis not present

## 2019-05-24 DIAGNOSIS — G4733 Obstructive sleep apnea (adult) (pediatric): Secondary | ICD-10-CM | POA: Diagnosis not present

## 2019-05-24 DIAGNOSIS — M542 Cervicalgia: Secondary | ICD-10-CM | POA: Diagnosis not present

## 2019-05-24 DIAGNOSIS — E1140 Type 2 diabetes mellitus with diabetic neuropathy, unspecified: Secondary | ICD-10-CM | POA: Diagnosis not present

## 2019-05-24 DIAGNOSIS — F329 Major depressive disorder, single episode, unspecified: Secondary | ICD-10-CM | POA: Diagnosis not present

## 2019-05-24 DIAGNOSIS — F17210 Nicotine dependence, cigarettes, uncomplicated: Secondary | ICD-10-CM | POA: Diagnosis not present

## 2019-05-24 DIAGNOSIS — G4700 Insomnia, unspecified: Secondary | ICD-10-CM | POA: Diagnosis not present

## 2019-05-24 DIAGNOSIS — I5043 Acute on chronic combined systolic (congestive) and diastolic (congestive) heart failure: Secondary | ICD-10-CM | POA: Diagnosis not present

## 2019-05-24 DIAGNOSIS — J449 Chronic obstructive pulmonary disease, unspecified: Secondary | ICD-10-CM | POA: Diagnosis not present

## 2019-05-24 DIAGNOSIS — K8010 Calculus of gallbladder with chronic cholecystitis without obstruction: Secondary | ICD-10-CM | POA: Diagnosis not present

## 2019-05-24 DIAGNOSIS — I25810 Atherosclerosis of coronary artery bypass graft(s) without angina pectoris: Secondary | ICD-10-CM | POA: Diagnosis not present

## 2019-05-24 DIAGNOSIS — S42302D Unspecified fracture of shaft of humerus, left arm, subsequent encounter for fracture with routine healing: Secondary | ICD-10-CM | POA: Diagnosis not present

## 2019-05-24 DIAGNOSIS — D6862 Lupus anticoagulant syndrome: Secondary | ICD-10-CM | POA: Diagnosis not present

## 2019-05-24 DIAGNOSIS — E559 Vitamin D deficiency, unspecified: Secondary | ICD-10-CM | POA: Diagnosis not present

## 2019-05-24 DIAGNOSIS — I110 Hypertensive heart disease with heart failure: Secondary | ICD-10-CM | POA: Diagnosis not present

## 2019-05-24 DIAGNOSIS — E1165 Type 2 diabetes mellitus with hyperglycemia: Secondary | ICD-10-CM | POA: Diagnosis not present

## 2019-05-24 DIAGNOSIS — J9621 Acute and chronic respiratory failure with hypoxia: Secondary | ICD-10-CM | POA: Diagnosis not present

## 2019-05-24 DIAGNOSIS — I272 Pulmonary hypertension, unspecified: Secondary | ICD-10-CM | POA: Diagnosis not present

## 2019-05-24 DIAGNOSIS — Z6841 Body Mass Index (BMI) 40.0 and over, adult: Secondary | ICD-10-CM | POA: Diagnosis not present

## 2019-05-24 DIAGNOSIS — I252 Old myocardial infarction: Secondary | ICD-10-CM | POA: Diagnosis not present

## 2019-05-24 DIAGNOSIS — M545 Low back pain: Secondary | ICD-10-CM | POA: Diagnosis not present

## 2019-05-24 DIAGNOSIS — E785 Hyperlipidemia, unspecified: Secondary | ICD-10-CM | POA: Diagnosis not present

## 2019-05-25 ENCOUNTER — Telehealth: Payer: Self-pay | Admitting: Orthopaedic Surgery

## 2019-05-25 DIAGNOSIS — K8010 Calculus of gallbladder with chronic cholecystitis without obstruction: Secondary | ICD-10-CM | POA: Diagnosis not present

## 2019-05-25 DIAGNOSIS — I5043 Acute on chronic combined systolic (congestive) and diastolic (congestive) heart failure: Secondary | ICD-10-CM | POA: Diagnosis not present

## 2019-05-25 DIAGNOSIS — J449 Chronic obstructive pulmonary disease, unspecified: Secondary | ICD-10-CM | POA: Diagnosis not present

## 2019-05-25 DIAGNOSIS — E1140 Type 2 diabetes mellitus with diabetic neuropathy, unspecified: Secondary | ICD-10-CM | POA: Diagnosis not present

## 2019-05-25 DIAGNOSIS — I110 Hypertensive heart disease with heart failure: Secondary | ICD-10-CM | POA: Diagnosis not present

## 2019-05-25 DIAGNOSIS — J9621 Acute and chronic respiratory failure with hypoxia: Secondary | ICD-10-CM | POA: Diagnosis not present

## 2019-05-25 NOTE — Telephone Encounter (Signed)
Malarie with [facility] called in said the patient had broken her left arm and missed her follow up appt, she's needing to know if the pt has any restrictions on her left arm?   [phone]

## 2019-05-27 DIAGNOSIS — E1149 Type 2 diabetes mellitus with other diabetic neurological complication: Secondary | ICD-10-CM | POA: Diagnosis not present

## 2019-05-27 DIAGNOSIS — J9621 Acute and chronic respiratory failure with hypoxia: Secondary | ICD-10-CM | POA: Diagnosis not present

## 2019-05-27 DIAGNOSIS — I5043 Acute on chronic combined systolic (congestive) and diastolic (congestive) heart failure: Secondary | ICD-10-CM | POA: Diagnosis not present

## 2019-05-27 DIAGNOSIS — I110 Hypertensive heart disease with heart failure: Secondary | ICD-10-CM | POA: Diagnosis not present

## 2019-05-28 NOTE — Telephone Encounter (Signed)
noted 

## 2019-05-28 NOTE — Telephone Encounter (Signed)
Please advise 

## 2019-05-28 NOTE — Telephone Encounter (Signed)
I called. OT therapist to tell pt to wear her brace which she is not doing. Noncompliant and I have told her she will get a nonunion which may be painful.  Last appt PDKA

## 2019-05-31 DIAGNOSIS — I110 Hypertensive heart disease with heart failure: Secondary | ICD-10-CM | POA: Diagnosis not present

## 2019-05-31 DIAGNOSIS — E1140 Type 2 diabetes mellitus with diabetic neuropathy, unspecified: Secondary | ICD-10-CM | POA: Diagnosis not present

## 2019-05-31 DIAGNOSIS — J9621 Acute and chronic respiratory failure with hypoxia: Secondary | ICD-10-CM | POA: Diagnosis not present

## 2019-05-31 DIAGNOSIS — I5043 Acute on chronic combined systolic (congestive) and diastolic (congestive) heart failure: Secondary | ICD-10-CM | POA: Diagnosis not present

## 2019-05-31 DIAGNOSIS — J449 Chronic obstructive pulmonary disease, unspecified: Secondary | ICD-10-CM | POA: Diagnosis not present

## 2019-05-31 DIAGNOSIS — K8010 Calculus of gallbladder with chronic cholecystitis without obstruction: Secondary | ICD-10-CM | POA: Diagnosis not present

## 2019-06-06 DIAGNOSIS — I5043 Acute on chronic combined systolic (congestive) and diastolic (congestive) heart failure: Secondary | ICD-10-CM | POA: Diagnosis not present

## 2019-06-06 DIAGNOSIS — J9621 Acute and chronic respiratory failure with hypoxia: Secondary | ICD-10-CM | POA: Diagnosis not present

## 2019-06-06 DIAGNOSIS — J449 Chronic obstructive pulmonary disease, unspecified: Secondary | ICD-10-CM | POA: Diagnosis not present

## 2019-06-06 DIAGNOSIS — E1140 Type 2 diabetes mellitus with diabetic neuropathy, unspecified: Secondary | ICD-10-CM | POA: Diagnosis not present

## 2019-06-06 DIAGNOSIS — K8010 Calculus of gallbladder with chronic cholecystitis without obstruction: Secondary | ICD-10-CM | POA: Diagnosis not present

## 2019-06-06 DIAGNOSIS — I110 Hypertensive heart disease with heart failure: Secondary | ICD-10-CM | POA: Diagnosis not present

## 2019-06-13 DIAGNOSIS — J9621 Acute and chronic respiratory failure with hypoxia: Secondary | ICD-10-CM | POA: Diagnosis not present

## 2019-06-13 DIAGNOSIS — J449 Chronic obstructive pulmonary disease, unspecified: Secondary | ICD-10-CM | POA: Diagnosis not present

## 2019-06-13 DIAGNOSIS — I5043 Acute on chronic combined systolic (congestive) and diastolic (congestive) heart failure: Secondary | ICD-10-CM | POA: Diagnosis not present

## 2019-06-13 DIAGNOSIS — I110 Hypertensive heart disease with heart failure: Secondary | ICD-10-CM | POA: Diagnosis not present

## 2019-06-13 DIAGNOSIS — E1140 Type 2 diabetes mellitus with diabetic neuropathy, unspecified: Secondary | ICD-10-CM | POA: Diagnosis not present

## 2019-06-13 DIAGNOSIS — K8010 Calculus of gallbladder with chronic cholecystitis without obstruction: Secondary | ICD-10-CM | POA: Diagnosis not present

## 2019-06-18 ENCOUNTER — Other Ambulatory Visit: Payer: Self-pay | Admitting: Pharmacist

## 2019-06-18 NOTE — Patient Outreach (Signed)
[facility]) Care Management  06/18/2019  Theresa [name] 08/23/1966 [id number]   Patient handed off to pharmacy vendor ([place], PharmD Erlene Quan [phone]).  Voicemail left with details of consult.    Regina Eck, PharmD, [place]  [phone]

## 2019-06-19 ENCOUNTER — Other Ambulatory Visit: Payer: Self-pay

## 2019-06-19 ENCOUNTER — Ambulatory Visit (HOSPITAL_COMMUNITY)
Admission: RE | Admit: 2019-06-19 | Discharge: 2019-06-19 | Disposition: A | Discharge status: Home / Self Care | Payer: Medicare Other | Source: Ambulatory Visit | Attending: Vascular Surgery | Admitting: Vascular Surgery

## 2019-06-19 ENCOUNTER — Encounter: Payer: Self-pay | Admitting: Vascular Surgery

## 2019-06-19 ENCOUNTER — Ambulatory Visit (INDEPENDENT_AMBULATORY_CARE_PROVIDER_SITE_OTHER): Payer: Medicare Other | Admitting: Vascular Surgery

## 2019-06-19 DIAGNOSIS — I872 Venous insufficiency (chronic) (peripheral): Secondary | ICD-10-CM

## 2019-06-19 DIAGNOSIS — I83893 Varicose veins of bilateral lower extremities with other complications: Secondary | ICD-10-CM

## 2019-06-19 NOTE — Progress Notes (Signed)
Patient name: Theresa [name] MRN: [id number] DOB: 15-Mar-1966 Sex: female  REASON FOR CONSULT: Evaluate varicose veins  HPI: Theresa [name] is a 53 y.o. female, with history of coronary artery disease status post MI with CABG, morbid obesity, history of left lower extremity DVT with lupus anticoagulant and on Coumadin, COPD on home oxygen, chronic diastolic heart failure, history of cardiac arrest earlier this year ultimately requiring trach that presents for evaluation of varicose veins.  Patient states that both of her lower extremities have swelling and she has aching discomfort along the medial anterior part of both of her shins.  She does not wear compression.  She states her left leg is slightly worse.  She has never had any vein intervention in the past.  She does have a history of a DVT in the left leg from 2002.  States this is likely related to her hypercoagulable disorder from lupus anticoagulant and she has remained on coumadin.  She is back living at home on home oxygen and states she takes care of her grandbabies and does home schooling.  Last echo 01/13/19 severe hypokinesis and moderate reduced EF 40-45%.    Past Medical History:  Diagnosis Date  . Acute kidney failure with lesion of tubular necrosis (HCC)   . Acute systolic heart failure ([place])   . CAD (coronary artery disease)    a. s/p prior PCI. b. CABG 2007 at [facility] in [place] 2007. c. inferior STEMI 10/2015 s/p DES to dSVG-PDA.  . Cardiac arrest ([place])   . Cervical cancer ([place])   . Chronic diastolic CHF (congestive heart failure) ([place])   . Chronic respiratory failure ([place])    s/p tracheostomy 2002  . Chronic RUQ pain   . COPD (chronic obstructive pulmonary disease) ([place])   . Diabetes mellitus ([place])   . DVT (deep venous thrombosis) ([place])   . Endometriosis   . History of gallstones 01/2016   seen on Ultrasound  . History of HIDA scan 11/2016   normal  . HTN (hypertension)   . Hyperlipidemia   . Lupus  anticoagulant disorder (HCC)    on coumadin  . Morbid obesity ([place])   . ST elevation (STEMI) myocardial infarction involving right coronary artery ([place]) 10/29/15   stent to VG to PDA  . Toe fracture, right 03/29/2018  . Tracheostomy in place [facility]), chronic since 2002 11/03/2015  . Tracheostomy status [facility])     Past Surgical History:  Procedure Laterality Date  . CARDIAC CATHETERIZATION N/A 10/29/2015   Procedure: Left Heart Cath and Cors/Grafts Angiography;  Surgeon: Burnell Blanks, MD;  Location: [place] [facility];  Service: Cardiovascular;  Laterality: N/A;  . CARDIAC CATHETERIZATION  10/29/2015   Procedure: Coronary Stent Intervention;  Surgeon: Burnell Blanks, MD;  Location: [place] [facility];  Service: Cardiovascular;;  . CAROTID STENT    . CESAREAN SECTION WITH BILATERAL TUBAL LIGATION    . CORONARY ARTERY BYPASS GRAFT  2007   2V  . IR GASTROSTOMY TUBE MOD SED  11/13/2018  . RIGHT/LEFT HEART CATH AND CORONARY/GRAFT ANGIOGRAPHY N/A 03/24/2018   Procedure: RIGHT/LEFT HEART CATH AND CORONARY/GRAFT ANGIOGRAPHY;  Surgeon: Belva Crome, MD;  Location: [place] [facility];  Service: Cardiovascular;  Laterality: N/A;  . TRACHEOSTOMY      Family History  Problem Relation Age of Onset  . Hypertension Mother   . Diabetes Mother   . Hypertension Father   . Diabetes Father     SOCIAL HISTORY:  Social History   Socioeconomic History  . Marital status: Married    Spouse name: Not on file  . Number of children: Not on file  . Years of education: Not on file  . Highest education level: Not on file  Occupational History  . Not on file  Social Needs  . Financial resource strain: Not on file  . Food insecurity    Worry: Not on file    Inability: Not on file  . Transportation needs    Medical: Not on file    Non-medical: Not on file  Tobacco Use  . Smoking status: Current Every Day Smoker    Packs/day: 0.75    Types: Cigarettes    Start date: 10/23/1978  .  Smokeless tobacco: Never Used  . Tobacco comment: 1/2 pack - trying to cut back   Substance and Sexual Activity  . Alcohol use: No    Alcohol/week: 0.0 standard drinks  . Drug use: No  . Sexual activity: Not on file  Lifestyle  . Physical activity    Days per week: Not on file    Minutes per session: Not on file  . Stress: Not on file  Relationships  . Social Herbalist on phone: Not on file    Gets together: Not on file    Attends religious service: Not on file    Active member of club or organization: Not on file    Attends meetings of clubs or organizations: Not on file    Relationship status: Not on file  . Intimate partner violence    Fear of current or ex partner: Not on file    Emotionally abused: Not on file    Physically abused: Not on file    Forced sexual activity: Not on file  Other Topics Concern  . Not on file  Social History Narrative  . Not on file    Allergies  Allergen Reactions  . Penicillins Rash    Has patient had a PCN reaction causing immediate rash, facial/tongue/throat swelling, SOB or lightheadedness with hypotension: Yes Has patient had a PCN reaction causing severe rash involving mucus membranes or skin necrosis: No Has patient had a PCN reaction that required hospitalization No Has patient had a PCN reaction occurring within the last 10 years: No If all of the above answers are "NO", then may proceed with Cephalosporin use.   REACTION: rash  . Ciprofloxacin     nausea  . Ibuprofen Rash    swelling in leg     Current Outpatient Medications  Medication Sig Dispense Refill  . albuterol (PROVENTIL) (2.5 MG/3ML) 0.083% nebulizer solution Take 3 mLs (2.5 mg total) by nebulization 2 (two) times daily. (Patient taking differently: Take 2.5 mg by nebulization every 4 (four) hours as needed for wheezing. ) 75 mL 12  . atorvastatin (LIPITOR) 20 MG tablet Take 1 tablet (20 mg total) by mouth daily. 30 tablet 3  . famotidine (PEPCID) 20  MG tablet Take 20 mg by mouth daily.    Marland Kitchen gabapentin (NEURONTIN) 300 MG capsule Take 1 capsule (300mg ) in the morning, 1 capsule (300mg ) in the afternoon, and 2 capsules (600mg ) at bedtime 120 capsule 2  . HYDROcodone-acetaminophen (NORCO/VICODIN) 5-325 MG tablet Take 1 tablet by mouth every 4 (four) hours as needed for moderate pain. 30 tablet 0  . Insulin Degludec (TRESIBA FLEXTOUCH [place]) Inject 30 Units into the skin every morning.    Marland Kitchen LORazepam (ATIVAN) 0.5 MG tablet Take 0.5  mg by mouth every 6 (six) hours as needed for anxiety.     Marland Kitchen losartan (COZAAR) 25 MG tablet Take 1 tablet (25 mg total) by mouth daily. 30 tablet 2  . metoprolol succinate (TOPROL-XL) 25 MG 24 hr tablet Take 25 mg by mouth daily.    . nitroGLYCERIN (NITROSTAT) 0.4 MG SL tablet Place 0.4 mg under the tongue every 5 (five) minutes as needed for chest pain.    Marland Kitchen OZEMPIC, 0.25 OR 0.5 MG/DOSE, 2 MG/1.5ML SOPN Inject 0.5 mg into the skin once a week.    . sertraline (ZOLOFT) 50 MG tablet Take 50 mg by mouth daily.    . tamsulosin (FLOMAX) 0.4 MG CAPS capsule Take 0.4 mg by mouth daily.    Marland Kitchen torsemide (DEMADEX) 20 MG tablet Take 3 tablets (60 mg total) by mouth daily. May take additional 60 mg in the afternoon as needed for > 3 lbs weight gain 180 tablet 1  . warfarin (COUMADIN) 2.5 MG tablet Take 2 tablets (5 mg) tonight at 6 PM.  Go to your PCP for INR tomorrow.     No current facility-administered medications for this visit.     REVIEW OF SYSTEMS:  [X]  denotes positive finding, [ ]  denotes negative finding Cardiac  Comments:  Chest pain or chest pressure:    Shortness of breath upon exertion: x   Short of breath when lying flat: x   Irregular heart rhythm:        Vascular    Pain in calf, thigh, or hip brought on by ambulation:    Pain in feet at night that wakes you up from your sleep:     Blood clot in your veins:    Leg swelling:  x       Pulmonary    Oxygen at home:    Productive cough:     Wheezing:          Neurologic    Sudden weakness in arms or legs:     Sudden numbness in arms or legs:     Sudden onset of difficulty speaking or slurred speech:    Temporary loss of vision in one eye:     Problems with dizziness:         Gastrointestinal    Blood in stool:     Vomited blood:         Genitourinary    Burning when urinating:     Blood in urine:        Psychiatric    Major depression:         Hematologic    Bleeding problems:    Problems with blood clotting too easily:        Skin    Rashes or ulcers:        Constitutional    Fever or chills:      PHYSICAL EXAM: Vitals:   06/19/19 1203  BP: 114/72  Pulse: 82  Resp: 16  Temp: (!) 97.3 F (36.3 C)  TempSrc: Temporal  SpO2: 100%  Weight: 200 lb (90.7 kg)  Height: 4\' 10"  (1.473 m)    GENERAL: The patient is a well-nourished female, in no acute distress. The vital signs are documented above.   HEENT:  Trach CARDIAC: There is a regular rate and rhythm.  VASCULAR:  No palpable pedal pulses Right posterior tibial brisk by Doppler, dorsalis pedis biphasic Left posterior tibial more brisk by Doppler, dorsalis pedis monophasic No lower extremity tissue loss or staining No  lower extremity dependent rubor PULMONARY: There is good air exchange bilaterally without wheezing or rales.  On nasal oxygen. ABDOMEN: Soft and non-tender with normal pitched bowel sounds.  MUSCULOSKELETAL: There are no major deformities or cyanosis. NEUROLOGIC: No focal weakness or paresthesias are detected.   DATA:   I independently reviewed her lower extremity venous reflux study and she has abnormal reflux times in the deep system on the left in the left common femoral, femoral, and popliteal vein and proximal great saphenous in the thigh.  On the right she has abnormal reflux times in the superficial system in the great saphenous vein throughout the proximal to distal thigh and knee.  Assessment/Plan:  53 year old female who presents with  bilateral lower extremity venous insufficiency with obvious varicosities on exam.  CEAP classification C3 given some edema on exam.  Discussed that in her left leg the majority of her reflux is in the deep system and this is not amenable to surgical intervention.  In addition she has had history of DVT in the left leg from 2002 for which I do not have records.  Some of this may be post thrombotic syndrome.  On the right she does have evidence of superficial reflux of the great saphenous vein that would normally be considered for intervention like vein ablation if she fails compression and other conservative measures.  However given her history of lupus anticoagulant and DVT, taking her off Coumadin to ablate her saphenous vein would be high risk especially for proximal propogation of thrombus.  I recommended thigh-high compression with 20 or 30 mmHg to bilateral lower extremities that she can wear during the day and elevate her legs and other conservative management.  Discussed to let us know if she feels like she is not seeing improvements.   Marty Heck, MD [facility] Office: [phone] Pager: [phone]

## 2019-06-20 DIAGNOSIS — E1140 Type 2 diabetes mellitus with diabetic neuropathy, unspecified: Secondary | ICD-10-CM | POA: Diagnosis not present

## 2019-06-20 DIAGNOSIS — J449 Chronic obstructive pulmonary disease, unspecified: Secondary | ICD-10-CM | POA: Diagnosis not present

## 2019-06-20 DIAGNOSIS — I5043 Acute on chronic combined systolic (congestive) and diastolic (congestive) heart failure: Secondary | ICD-10-CM | POA: Diagnosis not present

## 2019-06-20 DIAGNOSIS — K8010 Calculus of gallbladder with chronic cholecystitis without obstruction: Secondary | ICD-10-CM | POA: Diagnosis not present

## 2019-06-20 DIAGNOSIS — J9621 Acute and chronic respiratory failure with hypoxia: Secondary | ICD-10-CM | POA: Diagnosis not present

## 2019-06-20 DIAGNOSIS — I110 Hypertensive heart disease with heart failure: Secondary | ICD-10-CM | POA: Diagnosis not present

## 2019-06-21 ENCOUNTER — Inpatient Hospital Stay (HOSPITAL_COMMUNITY): Admission: RE | Admit: 2019-06-21 | Payer: Medicare Other | Source: Ambulatory Visit

## 2019-06-21 ENCOUNTER — Ambulatory Visit (INDEPENDENT_AMBULATORY_CARE_PROVIDER_SITE_OTHER): Payer: Medicare Other | Admitting: Surgery

## 2019-06-21 ENCOUNTER — Ambulatory Visit (INDEPENDENT_AMBULATORY_CARE_PROVIDER_SITE_OTHER): Payer: Medicare Other

## 2019-06-21 ENCOUNTER — Encounter: Payer: Self-pay | Admitting: Surgery

## 2019-06-21 VITALS — BP 102/72 | HR 91 | Ht <= 58 in | Wt 194.0 lb

## 2019-06-21 DIAGNOSIS — S42332D Displaced oblique fracture of shaft of humerus, left arm, subsequent encounter for fracture with routine healing: Secondary | ICD-10-CM

## 2019-06-21 DIAGNOSIS — I25810 Atherosclerosis of coronary artery bypass graft(s) without angina pectoris: Secondary | ICD-10-CM

## 2019-06-21 NOTE — Progress Notes (Signed)
53 year old Y female returns for recheck of her left humerus fracture.  Date of injury March 21, 2019.  Patient again admits to not being very compliant with wearing her brace.  She has been using her arm and admits to having increased pain.  Patient does not an operative candidate due to significant medical history.  Exam Patient has good motion of her left wrist and finger extensors.  Plan I stressed to patient again the importance of being compliant with wearing her brace.  Reviewed x-rays with patient and family members present.  Advised I do not see significant callus formation at the fracture site and with this problem that she has she is at increased risk for reinjury.  She will follow up with Dr. Lorin Mercy in 4 weeks for recheck.

## 2019-06-23 DIAGNOSIS — F17210 Nicotine dependence, cigarettes, uncomplicated: Secondary | ICD-10-CM | POA: Diagnosis not present

## 2019-06-23 DIAGNOSIS — E1165 Type 2 diabetes mellitus with hyperglycemia: Secondary | ICD-10-CM | POA: Diagnosis not present

## 2019-06-23 DIAGNOSIS — F419 Anxiety disorder, unspecified: Secondary | ICD-10-CM | POA: Diagnosis not present

## 2019-06-23 DIAGNOSIS — D6862 Lupus anticoagulant syndrome: Secondary | ICD-10-CM | POA: Diagnosis not present

## 2019-06-23 DIAGNOSIS — Z9981 Dependence on supplemental oxygen: Secondary | ICD-10-CM | POA: Diagnosis not present

## 2019-06-23 DIAGNOSIS — Z6841 Body Mass Index (BMI) 40.0 and over, adult: Secondary | ICD-10-CM | POA: Diagnosis not present

## 2019-06-23 DIAGNOSIS — Z930 Tracheostomy status: Secondary | ICD-10-CM | POA: Diagnosis not present

## 2019-06-23 DIAGNOSIS — I25810 Atherosclerosis of coronary artery bypass graft(s) without angina pectoris: Secondary | ICD-10-CM | POA: Diagnosis not present

## 2019-06-23 DIAGNOSIS — S42302D Unspecified fracture of shaft of humerus, left arm, subsequent encounter for fracture with routine healing: Secondary | ICD-10-CM | POA: Diagnosis not present

## 2019-06-23 DIAGNOSIS — J449 Chronic obstructive pulmonary disease, unspecified: Secondary | ICD-10-CM | POA: Diagnosis not present

## 2019-06-23 DIAGNOSIS — G4733 Obstructive sleep apnea (adult) (pediatric): Secondary | ICD-10-CM | POA: Diagnosis not present

## 2019-06-23 DIAGNOSIS — M545 Low back pain: Secondary | ICD-10-CM | POA: Diagnosis not present

## 2019-06-23 DIAGNOSIS — I272 Pulmonary hypertension, unspecified: Secondary | ICD-10-CM | POA: Diagnosis not present

## 2019-06-23 DIAGNOSIS — E559 Vitamin D deficiency, unspecified: Secondary | ICD-10-CM | POA: Diagnosis not present

## 2019-06-23 DIAGNOSIS — E785 Hyperlipidemia, unspecified: Secondary | ICD-10-CM | POA: Diagnosis not present

## 2019-06-23 DIAGNOSIS — I5043 Acute on chronic combined systolic (congestive) and diastolic (congestive) heart failure: Secondary | ICD-10-CM | POA: Diagnosis not present

## 2019-06-23 DIAGNOSIS — K8010 Calculus of gallbladder with chronic cholecystitis without obstruction: Secondary | ICD-10-CM | POA: Diagnosis not present

## 2019-06-23 DIAGNOSIS — I110 Hypertensive heart disease with heart failure: Secondary | ICD-10-CM | POA: Diagnosis not present

## 2019-06-23 DIAGNOSIS — J9621 Acute and chronic respiratory failure with hypoxia: Secondary | ICD-10-CM | POA: Diagnosis not present

## 2019-06-23 DIAGNOSIS — F329 Major depressive disorder, single episode, unspecified: Secondary | ICD-10-CM | POA: Diagnosis not present

## 2019-06-23 DIAGNOSIS — G4700 Insomnia, unspecified: Secondary | ICD-10-CM | POA: Diagnosis not present

## 2019-06-23 DIAGNOSIS — M542 Cervicalgia: Secondary | ICD-10-CM | POA: Diagnosis not present

## 2019-06-23 DIAGNOSIS — E1140 Type 2 diabetes mellitus with diabetic neuropathy, unspecified: Secondary | ICD-10-CM | POA: Diagnosis not present

## 2019-06-23 DIAGNOSIS — I252 Old myocardial infarction: Secondary | ICD-10-CM | POA: Diagnosis not present

## 2019-06-26 DIAGNOSIS — I2510 Atherosclerotic heart disease of native coronary artery without angina pectoris: Secondary | ICD-10-CM | POA: Diagnosis not present

## 2019-06-26 DIAGNOSIS — I10 Essential (primary) hypertension: Secondary | ICD-10-CM | POA: Diagnosis not present

## 2019-06-26 DIAGNOSIS — E119 Type 2 diabetes mellitus without complications: Secondary | ICD-10-CM | POA: Diagnosis not present

## 2019-06-26 DIAGNOSIS — M159 Polyosteoarthritis, unspecified: Secondary | ICD-10-CM | POA: Diagnosis not present

## 2019-06-27 DIAGNOSIS — E1140 Type 2 diabetes mellitus with diabetic neuropathy, unspecified: Secondary | ICD-10-CM | POA: Diagnosis not present

## 2019-06-27 DIAGNOSIS — K8010 Calculus of gallbladder with chronic cholecystitis without obstruction: Secondary | ICD-10-CM | POA: Diagnosis not present

## 2019-06-27 DIAGNOSIS — J449 Chronic obstructive pulmonary disease, unspecified: Secondary | ICD-10-CM | POA: Diagnosis not present

## 2019-06-27 DIAGNOSIS — I5043 Acute on chronic combined systolic (congestive) and diastolic (congestive) heart failure: Secondary | ICD-10-CM | POA: Diagnosis not present

## 2019-06-27 DIAGNOSIS — J9621 Acute and chronic respiratory failure with hypoxia: Secondary | ICD-10-CM | POA: Diagnosis not present

## 2019-06-27 DIAGNOSIS — I110 Hypertensive heart disease with heart failure: Secondary | ICD-10-CM | POA: Diagnosis not present

## 2019-06-28 ENCOUNTER — Encounter: Payer: Self-pay | Admitting: Vascular Surgery

## 2019-07-04 DIAGNOSIS — J449 Chronic obstructive pulmonary disease, unspecified: Secondary | ICD-10-CM | POA: Diagnosis not present

## 2019-07-04 DIAGNOSIS — E1140 Type 2 diabetes mellitus with diabetic neuropathy, unspecified: Secondary | ICD-10-CM | POA: Diagnosis not present

## 2019-07-04 DIAGNOSIS — I5043 Acute on chronic combined systolic (congestive) and diastolic (congestive) heart failure: Secondary | ICD-10-CM | POA: Diagnosis not present

## 2019-07-04 DIAGNOSIS — I110 Hypertensive heart disease with heart failure: Secondary | ICD-10-CM | POA: Diagnosis not present

## 2019-07-04 DIAGNOSIS — J9621 Acute and chronic respiratory failure with hypoxia: Secondary | ICD-10-CM | POA: Diagnosis not present

## 2019-07-04 DIAGNOSIS — K8010 Calculus of gallbladder with chronic cholecystitis without obstruction: Secondary | ICD-10-CM | POA: Diagnosis not present

## 2019-07-11 DIAGNOSIS — J449 Chronic obstructive pulmonary disease, unspecified: Secondary | ICD-10-CM | POA: Diagnosis not present

## 2019-07-11 DIAGNOSIS — K8010 Calculus of gallbladder with chronic cholecystitis without obstruction: Secondary | ICD-10-CM | POA: Diagnosis not present

## 2019-07-11 DIAGNOSIS — J9621 Acute and chronic respiratory failure with hypoxia: Secondary | ICD-10-CM | POA: Diagnosis not present

## 2019-07-11 DIAGNOSIS — E1140 Type 2 diabetes mellitus with diabetic neuropathy, unspecified: Secondary | ICD-10-CM | POA: Diagnosis not present

## 2019-07-11 DIAGNOSIS — I5043 Acute on chronic combined systolic (congestive) and diastolic (congestive) heart failure: Secondary | ICD-10-CM | POA: Diagnosis not present

## 2019-07-11 DIAGNOSIS — I110 Hypertensive heart disease with heart failure: Secondary | ICD-10-CM | POA: Diagnosis not present

## 2019-07-12 ENCOUNTER — Inpatient Hospital Stay (HOSPITAL_COMMUNITY): Admission: RE | Admit: 2019-07-12 | Payer: Medicare Other | Source: Ambulatory Visit

## 2019-07-19 ENCOUNTER — Ambulatory Visit: Payer: Medicare Other | Admitting: Orthopaedic Surgery

## 2019-07-19 ENCOUNTER — Other Ambulatory Visit: Payer: Self-pay

## 2019-07-21 DIAGNOSIS — E1140 Type 2 diabetes mellitus with diabetic neuropathy, unspecified: Secondary | ICD-10-CM | POA: Diagnosis not present

## 2019-07-21 DIAGNOSIS — K8010 Calculus of gallbladder with chronic cholecystitis without obstruction: Secondary | ICD-10-CM | POA: Diagnosis not present

## 2019-07-21 DIAGNOSIS — I110 Hypertensive heart disease with heart failure: Secondary | ICD-10-CM | POA: Diagnosis not present

## 2019-07-21 DIAGNOSIS — J449 Chronic obstructive pulmonary disease, unspecified: Secondary | ICD-10-CM | POA: Diagnosis not present

## 2019-07-21 DIAGNOSIS — I5043 Acute on chronic combined systolic (congestive) and diastolic (congestive) heart failure: Secondary | ICD-10-CM | POA: Diagnosis not present

## 2019-07-21 DIAGNOSIS — J9621 Acute and chronic respiratory failure with hypoxia: Secondary | ICD-10-CM | POA: Diagnosis not present

## 2019-07-23 DIAGNOSIS — I110 Hypertensive heart disease with heart failure: Secondary | ICD-10-CM | POA: Diagnosis not present

## 2019-07-23 DIAGNOSIS — J449 Chronic obstructive pulmonary disease, unspecified: Secondary | ICD-10-CM | POA: Diagnosis not present

## 2019-07-23 DIAGNOSIS — Z6841 Body Mass Index (BMI) 40.0 and over, adult: Secondary | ICD-10-CM | POA: Diagnosis not present

## 2019-07-23 DIAGNOSIS — M545 Low back pain: Secondary | ICD-10-CM | POA: Diagnosis not present

## 2019-07-23 DIAGNOSIS — G4733 Obstructive sleep apnea (adult) (pediatric): Secondary | ICD-10-CM | POA: Diagnosis not present

## 2019-07-23 DIAGNOSIS — E785 Hyperlipidemia, unspecified: Secondary | ICD-10-CM | POA: Diagnosis not present

## 2019-07-23 DIAGNOSIS — F17210 Nicotine dependence, cigarettes, uncomplicated: Secondary | ICD-10-CM | POA: Diagnosis not present

## 2019-07-23 DIAGNOSIS — E1165 Type 2 diabetes mellitus with hyperglycemia: Secondary | ICD-10-CM | POA: Diagnosis not present

## 2019-07-23 DIAGNOSIS — J9621 Acute and chronic respiratory failure with hypoxia: Secondary | ICD-10-CM | POA: Diagnosis not present

## 2019-07-23 DIAGNOSIS — M542 Cervicalgia: Secondary | ICD-10-CM | POA: Diagnosis not present

## 2019-07-23 DIAGNOSIS — I25810 Atherosclerosis of coronary artery bypass graft(s) without angina pectoris: Secondary | ICD-10-CM | POA: Diagnosis not present

## 2019-07-23 DIAGNOSIS — I252 Old myocardial infarction: Secondary | ICD-10-CM | POA: Diagnosis not present

## 2019-07-23 DIAGNOSIS — K8010 Calculus of gallbladder with chronic cholecystitis without obstruction: Secondary | ICD-10-CM | POA: Diagnosis not present

## 2019-07-23 DIAGNOSIS — Z9981 Dependence on supplemental oxygen: Secondary | ICD-10-CM | POA: Diagnosis not present

## 2019-07-23 DIAGNOSIS — F419 Anxiety disorder, unspecified: Secondary | ICD-10-CM | POA: Diagnosis not present

## 2019-07-23 DIAGNOSIS — D6862 Lupus anticoagulant syndrome: Secondary | ICD-10-CM | POA: Diagnosis not present

## 2019-07-23 DIAGNOSIS — F329 Major depressive disorder, single episode, unspecified: Secondary | ICD-10-CM | POA: Diagnosis not present

## 2019-07-23 DIAGNOSIS — I272 Pulmonary hypertension, unspecified: Secondary | ICD-10-CM | POA: Diagnosis not present

## 2019-07-23 DIAGNOSIS — I5043 Acute on chronic combined systolic (congestive) and diastolic (congestive) heart failure: Secondary | ICD-10-CM | POA: Diagnosis not present

## 2019-07-23 DIAGNOSIS — G4700 Insomnia, unspecified: Secondary | ICD-10-CM | POA: Diagnosis not present

## 2019-07-23 DIAGNOSIS — E559 Vitamin D deficiency, unspecified: Secondary | ICD-10-CM | POA: Diagnosis not present

## 2019-07-23 DIAGNOSIS — E1140 Type 2 diabetes mellitus with diabetic neuropathy, unspecified: Secondary | ICD-10-CM | POA: Diagnosis not present

## 2019-07-23 DIAGNOSIS — Z930 Tracheostomy status: Secondary | ICD-10-CM | POA: Diagnosis not present

## 2019-07-23 DIAGNOSIS — S42302D Unspecified fracture of shaft of humerus, left arm, subsequent encounter for fracture with routine healing: Secondary | ICD-10-CM | POA: Diagnosis not present

## 2019-07-24 DIAGNOSIS — J449 Chronic obstructive pulmonary disease, unspecified: Secondary | ICD-10-CM | POA: Diagnosis not present

## 2019-07-24 DIAGNOSIS — I110 Hypertensive heart disease with heart failure: Secondary | ICD-10-CM | POA: Diagnosis not present

## 2019-07-24 DIAGNOSIS — I5043 Acute on chronic combined systolic (congestive) and diastolic (congestive) heart failure: Secondary | ICD-10-CM | POA: Diagnosis not present

## 2019-07-24 DIAGNOSIS — J9621 Acute and chronic respiratory failure with hypoxia: Secondary | ICD-10-CM | POA: Diagnosis not present

## 2019-07-24 DIAGNOSIS — K8010 Calculus of gallbladder with chronic cholecystitis without obstruction: Secondary | ICD-10-CM | POA: Diagnosis not present

## 2019-07-24 DIAGNOSIS — E1140 Type 2 diabetes mellitus with diabetic neuropathy, unspecified: Secondary | ICD-10-CM | POA: Diagnosis not present

## 2019-07-26 NOTE — Progress Notes (Signed)
Cardiology Office Note   Date:  07/27/2019   ID:  Aurea, Aronov Sep 24, 1965, MRN [id number]  PCP:  Monico Blitz, MD  Cardiologist:   [name], MD   Chief Complaint  Patient presents with  . Shortness of Breath      History of Present Illness: Theresa [name] is a 53 y.o. female who presents for follow up of diastolic HF.  She has a history of two-vessel CABG with stenting prior to her CABG performed in [place] with CABG performed in [place] ([place] at [facility] in 2007.  She was hospitalized in May 2017 for acute on chronic hypoxic respiratory failure multifactorial in etiology due to acute on chronic diastolic heart failure, pneumonia, and COPD exacerbation.   She was hospitalized for an acute inferior STEMI in February 2017 and underwent drug-eluting stent placement in the distal SVG to the PDA.  She also has a history of diabetes, hypertension, hyperlipidemia, lupus anticoagulant with DVT and is on chronic anticoagulant therapy.  July 2019 she had chest pain.    She was managed medically.  She had a mean pulmonary pressure of 40.  She had acute on chronic HF with an EF of 50%.   She had PEA arrest due to dislodged tracheostomy in 10/2018.    The patient presented to the [facility] ED on 01/12/2019 for evaluation of shortness of breath, edema, and weight gain over the prior week. She was admitted with volume overload and had IV diuresis.   Since I last saw her she has done relatively well.  She feels her heart skipping occasionally about 3 weeks ago but it seemed to have stopped.  She is not had any presyncope or syncope.  She denies any chest pressure, neck or arm discomfort.  She has had no weight gain or edema.   Past Medical History:  Diagnosis Date  . Acute kidney failure with lesion of tubular necrosis (HCC)   . Acute systolic heart failure ([place])   . CAD (coronary artery disease)    a. s/p prior PCI. b. CABG 2007 at [facility] in [place]  2007. c. inferior STEMI 10/2015 s/p DES to dSVG-PDA.  . Cardiac arrest ([place])   . Cervical cancer ([place])   . Chronic diastolic CHF (congestive heart failure) ([place])   . Chronic respiratory failure ([place])    s/p tracheostomy 2002  . Chronic RUQ pain   . COPD (chronic obstructive pulmonary disease) ([place])   . Diabetes mellitus ([place])   . DVT (deep venous thrombosis) ([place])   . Endometriosis   . History of gallstones 01/2016   seen on Ultrasound  . History of HIDA scan 11/2016   normal  . HTN (hypertension)   . Hyperlipidemia   . Lupus anticoagulant disorder (HCC)    on coumadin  . Morbid obesity ([place])   . ST elevation (STEMI) myocardial infarction involving right coronary artery ([place]) 10/29/15   stent to VG to PDA  . Toe fracture, right 03/29/2018  . Tracheostomy in place [facility]), chronic since 2002 11/03/2015  . Tracheostomy status [facility])     Past Surgical History:  Procedure Laterality Date  . CARDIAC CATHETERIZATION N/A 10/29/2015   Procedure: Left Heart Cath and Cors/Grafts Angiography;  Surgeon: Burnell Blanks, MD;  Location: [place] [facility];  Service: Cardiovascular;  Laterality: N/A;  . CARDIAC CATHETERIZATION  10/29/2015   Procedure: Coronary Stent Intervention;  Surgeon: Burnell Blanks, MD;  Location: [place] [facility];  Service: Cardiovascular;;  . CAROTID STENT    . CESAREAN SECTION WITH BILATERAL TUBAL LIGATION    . CORONARY ARTERY BYPASS GRAFT  2007   2V  . IR GASTROSTOMY TUBE MOD SED  11/13/2018  . RIGHT/LEFT HEART CATH AND CORONARY/GRAFT ANGIOGRAPHY N/A 03/24/2018   Procedure: RIGHT/LEFT HEART CATH AND CORONARY/GRAFT ANGIOGRAPHY;  Surgeon: Belva Crome, MD;  Location: [place] [facility];  Service: Cardiovascular;  Laterality: N/A;  . TRACHEOSTOMY       Current Outpatient Medications  Medication Sig Dispense Refill  . albuterol (PROVENTIL) (2.5 MG/3ML) 0.083% nebulizer solution Take 3 mLs (2.5 mg total) by nebulization 2 (two) times daily. (Patient taking  differently: Take 2.5 mg by nebulization every 4 (four) hours as needed for wheezing. ) 75 mL 12  . atorvastatin (LIPITOR) 20 MG tablet Take 1 tablet (20 mg total) by mouth daily. 30 tablet 3  . gabapentin (NEURONTIN) 300 MG capsule Take 1 capsule (300mg ) in the morning, 1 capsule (300mg ) in the afternoon, and 2 capsules (600mg ) at bedtime 120 capsule 2  . Insulin Degludec (TRESIBA FLEXTOUCH [place]) Inject 30 Units into the skin every morning.    Marland Kitchen LORazepam (ATIVAN) 0.5 MG tablet Take 0.5 mg by mouth every 6 (six) hours as needed for anxiety.     Marland Kitchen losartan (COZAAR) 25 MG tablet Take 1 tablet (25 mg total) by mouth daily. 30 tablet 2  . metoprolol succinate (TOPROL-XL) 50 MG 24 hr tablet Take 1 tablet (50 mg total) by mouth daily. 90 tablet 3  . nitroGLYCERIN (NITROSTAT) 0.4 MG SL tablet Place 0.4 mg under the tongue every 5 (five) minutes as needed for chest pain.    Marland Kitchen OZEMPIC, 0.25 OR 0.5 MG/DOSE, 2 MG/1.5ML SOPN Inject 0.5 mg into the skin once a week.    . sertraline (ZOLOFT) 50 MG tablet Take 50 mg by mouth daily.    . tamsulosin (FLOMAX) 0.4 MG CAPS capsule Take 0.4 mg by mouth daily.    Marland Kitchen torsemide (DEMADEX) 20 MG tablet Take 3 tablets (60 mg total) by mouth daily. May take additional 60 mg in the afternoon as needed for > 3 lbs weight gain 180 tablet 1  . warfarin (COUMADIN) 2.5 MG tablet Take 2 tablets (5 mg) tonight at 6 PM.  Go to your PCP for INR tomorrow.     No current facility-administered medications for this visit.     Allergies:   Penicillins, Ciprofloxacin, and Ibuprofen    ROS:  Please see the history of present illness.   Otherwise, review of systems are positive for none.   All other systems are reviewed and negative.    PHYSICAL EXAM: VS:  BP 131/83   Pulse 79   Temp (!) 97 F (36.1 C)   Ht 4\' 10"  (1.473 m)   Wt 194 lb (88 kg)   SpO2 96%   BMI 40.55 kg/m  , BMI Body mass index is 40.55 kg/m. GENERAL:  Well appearing NECK:  No jugular venous distention, waveform  within normal limits, carotid upstroke brisk and symmetric, no bruits, no thyromegaly, tracheostomy in place LUNGS:  Clear to auscultation bilaterally CHEST:  Well healed sternotomy scar. HEART:  PMI not displaced or sustained,S1 and S2 within normal limits, no S3, no S4, no clicks, no rubs, no murmurs ABD:  Flat, positive bowel sounds normal in frequency in pitch, no bruits, no rebound, no guarding, no midline pulsatile mass, no hepatomegaly, no splenomegaly EXT:  2 plus pulses throughout, no edema, no  cyanosis no clubbing  EKG:  EKG is not ordered today.    Recent Labs: 05/07/2019: B Natriuretic Peptide 2,264.5 05/17/2019: ALT 21; BUN 13; Creatinine, Ser 0.72; Hemoglobin 12.2; Magnesium 1.7; Platelets 187; Potassium 4.3; Sodium 138    Lipid Panel    Component Value Date/Time   CHOL 167 03/24/2018 0451   TRIG 101 03/24/2018 0451   HDL 44 03/24/2018 0451   CHOLHDL 3.8 03/24/2018 0451   VLDL 20 03/24/2018 0451   LDLCALC 103 (H) 03/24/2018 0451      Wt Readings from Last 3 Encounters:  07/27/19 194 lb (88 kg)  06/21/19 194 lb (88 kg)  06/19/19 200 lb (90.7 kg)      Other studies Reviewed: Additional studies/ records that were reviewed today include: None. Review of the above records demonstrates:NA   ASSESSMENT AND PLAN:  Chronic Mixed CHF:   Today I am going to try to increase her metoprolol to 50 mg daily.  She will remain on the other meds as listed.  Hypertension:     This is being managed in the context of treating his CHF  CAD: Hx of CABG.    She is not having any active chest pain.  No change in therapy.  Hx of DVT with Lupus Anticoagulant Syndrome:    She remains on anticoagulation.  No change in therapy.   Current medicines are reviewed at length with the patient today.  The patient does not have concerns regarding medicines.  The following changes have been made:  As above  Labs/ tests ordered today include:  No orders of the defined types were placed  in this encounter.    Disposition:   FU with me in 12 months.     Signed, [name], MD  07/27/2019 3:14 PM    [facility]

## 2019-07-27 ENCOUNTER — Encounter: Payer: Self-pay | Admitting: Cardiology

## 2019-07-27 ENCOUNTER — Other Ambulatory Visit: Payer: Self-pay

## 2019-07-27 ENCOUNTER — Ambulatory Visit (INDEPENDENT_AMBULATORY_CARE_PROVIDER_SITE_OTHER): Payer: Medicare Other | Admitting: Cardiology

## 2019-07-27 ENCOUNTER — Ambulatory Visit: Payer: Medicare Other | Admitting: Cardiology

## 2019-07-27 VITALS — BP 131/83 | HR 79 | Temp 97.0°F | Ht <= 58 in | Wt 194.0 lb

## 2019-07-27 DIAGNOSIS — Z794 Long term (current) use of insulin: Secondary | ICD-10-CM | POA: Diagnosis not present

## 2019-07-27 DIAGNOSIS — I2510 Atherosclerotic heart disease of native coronary artery without angina pectoris: Secondary | ICD-10-CM

## 2019-07-27 DIAGNOSIS — E118 Type 2 diabetes mellitus with unspecified complications: Secondary | ICD-10-CM

## 2019-07-27 DIAGNOSIS — I25810 Atherosclerosis of coronary artery bypass graft(s) without angina pectoris: Secondary | ICD-10-CM

## 2019-07-27 DIAGNOSIS — D6862 Lupus anticoagulant syndrome: Secondary | ICD-10-CM | POA: Diagnosis not present

## 2019-07-27 DIAGNOSIS — I5042 Chronic combined systolic (congestive) and diastolic (congestive) heart failure: Secondary | ICD-10-CM

## 2019-07-27 DIAGNOSIS — I10 Essential (primary) hypertension: Secondary | ICD-10-CM

## 2019-07-27 MED ORDER — METOPROLOL SUCCINATE ER 50 MG PO TB24: 50.0000 mg | ORAL_TABLET | Freq: Every day | ORAL | 3 refills | Status: DC

## 2019-07-27 NOTE — Patient Instructions (Signed)
Medication Instructions:  Increase Metoprolol to 50mg  Daily.   If you need a refill on your cardiac medications before your next appointment, please call your pharmacy.   Lab work: NONE  Testing/Procedures: NONE  Follow-Up: At [facility], you and your health needs are our priority.  As part of our continuing mission to provide you with exceptional heart care, we have created designated Provider Care Teams.  These Care Teams include your primary Cardiologist (physician) and Advanced Practice Providers (APPs -  Physician Assistants and Nurse Practitioners) who all work together to provide you with the care you need, when you need it. You may see [name], MD or one of the following Advanced Practice Providers on your designated Care Team:    Rosaria Ferries, PA-C  Jory Sims, DNP, ANP  Cadence Kathlen Mody, NP  Your physician wants you to follow-up in: 1 year. You will receive a reminder letter in the mail two months in advance. If you don't receive a letter, please call our office to schedule the follow-up appointment.

## 2019-07-31 DIAGNOSIS — J9621 Acute and chronic respiratory failure with hypoxia: Secondary | ICD-10-CM | POA: Diagnosis not present

## 2019-07-31 DIAGNOSIS — J449 Chronic obstructive pulmonary disease, unspecified: Secondary | ICD-10-CM | POA: Diagnosis not present

## 2019-07-31 DIAGNOSIS — I5043 Acute on chronic combined systolic (congestive) and diastolic (congestive) heart failure: Secondary | ICD-10-CM | POA: Diagnosis not present

## 2019-07-31 DIAGNOSIS — K8010 Calculus of gallbladder with chronic cholecystitis without obstruction: Secondary | ICD-10-CM | POA: Diagnosis not present

## 2019-07-31 DIAGNOSIS — I110 Hypertensive heart disease with heart failure: Secondary | ICD-10-CM | POA: Diagnosis not present

## 2019-07-31 DIAGNOSIS — E1140 Type 2 diabetes mellitus with diabetic neuropathy, unspecified: Secondary | ICD-10-CM | POA: Diagnosis not present

## 2019-08-08 DIAGNOSIS — I110 Hypertensive heart disease with heart failure: Secondary | ICD-10-CM | POA: Diagnosis not present

## 2019-08-08 DIAGNOSIS — J9621 Acute and chronic respiratory failure with hypoxia: Secondary | ICD-10-CM | POA: Diagnosis not present

## 2019-08-08 DIAGNOSIS — E1140 Type 2 diabetes mellitus with diabetic neuropathy, unspecified: Secondary | ICD-10-CM | POA: Diagnosis not present

## 2019-08-08 DIAGNOSIS — I5043 Acute on chronic combined systolic (congestive) and diastolic (congestive) heart failure: Secondary | ICD-10-CM | POA: Diagnosis not present

## 2019-08-08 DIAGNOSIS — K8010 Calculus of gallbladder with chronic cholecystitis without obstruction: Secondary | ICD-10-CM | POA: Diagnosis not present

## 2019-08-08 DIAGNOSIS — J449 Chronic obstructive pulmonary disease, unspecified: Secondary | ICD-10-CM | POA: Diagnosis not present

## 2019-08-10 DIAGNOSIS — I10 Essential (primary) hypertension: Secondary | ICD-10-CM | POA: Diagnosis not present

## 2019-08-10 DIAGNOSIS — I82409 Acute embolism and thrombosis of unspecified deep veins of unspecified lower extremity: Secondary | ICD-10-CM | POA: Diagnosis not present

## 2019-08-10 DIAGNOSIS — Z299 Encounter for prophylactic measures, unspecified: Secondary | ICD-10-CM | POA: Diagnosis not present

## 2019-08-10 DIAGNOSIS — Z6841 Body Mass Index (BMI) 40.0 and over, adult: Secondary | ICD-10-CM | POA: Diagnosis not present

## 2019-08-10 DIAGNOSIS — I5042 Chronic combined systolic (congestive) and diastolic (congestive) heart failure: Secondary | ICD-10-CM | POA: Diagnosis not present

## 2019-08-10 DIAGNOSIS — E1165 Type 2 diabetes mellitus with hyperglycemia: Secondary | ICD-10-CM | POA: Diagnosis not present

## 2019-08-17 DIAGNOSIS — I2510 Atherosclerotic heart disease of native coronary artery without angina pectoris: Secondary | ICD-10-CM | POA: Diagnosis not present

## 2019-08-17 DIAGNOSIS — E119 Type 2 diabetes mellitus without complications: Secondary | ICD-10-CM | POA: Diagnosis not present

## 2019-08-17 DIAGNOSIS — I10 Essential (primary) hypertension: Secondary | ICD-10-CM | POA: Diagnosis not present

## 2019-08-17 DIAGNOSIS — M159 Polyosteoarthritis, unspecified: Secondary | ICD-10-CM | POA: Diagnosis not present

## 2019-08-22 DIAGNOSIS — E785 Hyperlipidemia, unspecified: Secondary | ICD-10-CM | POA: Diagnosis not present

## 2019-08-22 DIAGNOSIS — S42302D Unspecified fracture of shaft of humerus, left arm, subsequent encounter for fracture with routine healing: Secondary | ICD-10-CM | POA: Diagnosis not present

## 2019-08-22 DIAGNOSIS — Z6841 Body Mass Index (BMI) 40.0 and over, adult: Secondary | ICD-10-CM | POA: Diagnosis not present

## 2019-08-22 DIAGNOSIS — F329 Major depressive disorder, single episode, unspecified: Secondary | ICD-10-CM | POA: Diagnosis not present

## 2019-08-22 DIAGNOSIS — Z930 Tracheostomy status: Secondary | ICD-10-CM | POA: Diagnosis not present

## 2019-08-22 DIAGNOSIS — E1165 Type 2 diabetes mellitus with hyperglycemia: Secondary | ICD-10-CM | POA: Diagnosis not present

## 2019-08-22 DIAGNOSIS — I110 Hypertensive heart disease with heart failure: Secondary | ICD-10-CM | POA: Diagnosis not present

## 2019-08-22 DIAGNOSIS — F17210 Nicotine dependence, cigarettes, uncomplicated: Secondary | ICD-10-CM | POA: Diagnosis not present

## 2019-08-22 DIAGNOSIS — E1140 Type 2 diabetes mellitus with diabetic neuropathy, unspecified: Secondary | ICD-10-CM | POA: Diagnosis not present

## 2019-08-22 DIAGNOSIS — I252 Old myocardial infarction: Secondary | ICD-10-CM | POA: Diagnosis not present

## 2019-08-22 DIAGNOSIS — J9621 Acute and chronic respiratory failure with hypoxia: Secondary | ICD-10-CM | POA: Diagnosis not present

## 2019-08-22 DIAGNOSIS — F419 Anxiety disorder, unspecified: Secondary | ICD-10-CM | POA: Diagnosis not present

## 2019-08-22 DIAGNOSIS — I272 Pulmonary hypertension, unspecified: Secondary | ICD-10-CM | POA: Diagnosis not present

## 2019-08-22 DIAGNOSIS — K8010 Calculus of gallbladder with chronic cholecystitis without obstruction: Secondary | ICD-10-CM | POA: Diagnosis not present

## 2019-08-22 DIAGNOSIS — I25810 Atherosclerosis of coronary artery bypass graft(s) without angina pectoris: Secondary | ICD-10-CM | POA: Diagnosis not present

## 2019-08-22 DIAGNOSIS — M545 Low back pain: Secondary | ICD-10-CM | POA: Diagnosis not present

## 2019-08-22 DIAGNOSIS — Z9981 Dependence on supplemental oxygen: Secondary | ICD-10-CM | POA: Diagnosis not present

## 2019-08-22 DIAGNOSIS — E559 Vitamin D deficiency, unspecified: Secondary | ICD-10-CM | POA: Diagnosis not present

## 2019-08-22 DIAGNOSIS — G4733 Obstructive sleep apnea (adult) (pediatric): Secondary | ICD-10-CM | POA: Diagnosis not present

## 2019-08-22 DIAGNOSIS — M542 Cervicalgia: Secondary | ICD-10-CM | POA: Diagnosis not present

## 2019-08-22 DIAGNOSIS — J449 Chronic obstructive pulmonary disease, unspecified: Secondary | ICD-10-CM | POA: Diagnosis not present

## 2019-08-22 DIAGNOSIS — D6862 Lupus anticoagulant syndrome: Secondary | ICD-10-CM | POA: Diagnosis not present

## 2019-08-22 DIAGNOSIS — I5043 Acute on chronic combined systolic (congestive) and diastolic (congestive) heart failure: Secondary | ICD-10-CM | POA: Diagnosis not present

## 2019-08-22 DIAGNOSIS — G4700 Insomnia, unspecified: Secondary | ICD-10-CM | POA: Diagnosis not present

## 2019-09-03 DIAGNOSIS — E119 Type 2 diabetes mellitus without complications: Secondary | ICD-10-CM | POA: Diagnosis not present

## 2019-09-05 DIAGNOSIS — K8010 Calculus of gallbladder with chronic cholecystitis without obstruction: Secondary | ICD-10-CM | POA: Diagnosis not present

## 2019-09-05 DIAGNOSIS — E1140 Type 2 diabetes mellitus with diabetic neuropathy, unspecified: Secondary | ICD-10-CM | POA: Diagnosis not present

## 2019-09-05 DIAGNOSIS — J9621 Acute and chronic respiratory failure with hypoxia: Secondary | ICD-10-CM | POA: Diagnosis not present

## 2019-09-05 DIAGNOSIS — I5043 Acute on chronic combined systolic (congestive) and diastolic (congestive) heart failure: Secondary | ICD-10-CM | POA: Diagnosis not present

## 2019-09-05 DIAGNOSIS — I110 Hypertensive heart disease with heart failure: Secondary | ICD-10-CM | POA: Diagnosis not present

## 2019-09-05 DIAGNOSIS — J449 Chronic obstructive pulmonary disease, unspecified: Secondary | ICD-10-CM | POA: Diagnosis not present

## 2019-09-06 ENCOUNTER — Other Ambulatory Visit: Payer: Self-pay

## 2019-09-06 ENCOUNTER — Ambulatory Visit: Payer: Medicare Other | Attending: Internal Medicine

## 2019-09-06 DIAGNOSIS — Z20822 Contact with and (suspected) exposure to covid-19: Secondary | ICD-10-CM

## 2019-09-07 LAB — NOVEL CORONAVIRUS, NAA: SARS-CoV-2, NAA: DETECTED — AB

## 2019-09-10 DIAGNOSIS — J9611 Chronic respiratory failure with hypoxia: Secondary | ICD-10-CM | POA: Diagnosis not present

## 2019-09-10 DIAGNOSIS — Z299 Encounter for prophylactic measures, unspecified: Secondary | ICD-10-CM | POA: Diagnosis not present

## 2019-09-10 DIAGNOSIS — I5030 Unspecified diastolic (congestive) heart failure: Secondary | ICD-10-CM | POA: Diagnosis not present

## 2019-09-10 DIAGNOSIS — Z6841 Body Mass Index (BMI) 40.0 and over, adult: Secondary | ICD-10-CM | POA: Diagnosis not present

## 2019-09-10 DIAGNOSIS — E1165 Type 2 diabetes mellitus with hyperglycemia: Secondary | ICD-10-CM | POA: Diagnosis not present

## 2019-09-10 DIAGNOSIS — U071 COVID-19: Secondary | ICD-10-CM | POA: Diagnosis not present

## 2019-09-20 DIAGNOSIS — M159 Polyosteoarthritis, unspecified: Secondary | ICD-10-CM | POA: Diagnosis not present

## 2019-09-20 DIAGNOSIS — E119 Type 2 diabetes mellitus without complications: Secondary | ICD-10-CM | POA: Diagnosis not present

## 2019-09-20 DIAGNOSIS — I2510 Atherosclerotic heart disease of native coronary artery without angina pectoris: Secondary | ICD-10-CM | POA: Diagnosis not present

## 2019-09-20 DIAGNOSIS — I10 Essential (primary) hypertension: Secondary | ICD-10-CM | POA: Diagnosis not present

## 2019-09-26 DIAGNOSIS — Z6841 Body Mass Index (BMI) 40.0 and over, adult: Secondary | ICD-10-CM | POA: Diagnosis not present

## 2019-09-26 DIAGNOSIS — U071 COVID-19: Secondary | ICD-10-CM | POA: Diagnosis not present

## 2019-09-26 DIAGNOSIS — J9611 Chronic respiratory failure with hypoxia: Secondary | ICD-10-CM | POA: Diagnosis not present

## 2019-09-26 DIAGNOSIS — I80209 Phlebitis and thrombophlebitis of unspecified deep vessels of unspecified lower extremity: Secondary | ICD-10-CM | POA: Diagnosis not present

## 2019-09-26 DIAGNOSIS — Z299 Encounter for prophylactic measures, unspecified: Secondary | ICD-10-CM | POA: Diagnosis not present

## 2019-09-26 DIAGNOSIS — I5030 Unspecified diastolic (congestive) heart failure: Secondary | ICD-10-CM | POA: Diagnosis not present

## 2019-09-26 DIAGNOSIS — F17210 Nicotine dependence, cigarettes, uncomplicated: Secondary | ICD-10-CM | POA: Diagnosis not present

## 2019-10-04 DIAGNOSIS — F17210 Nicotine dependence, cigarettes, uncomplicated: Secondary | ICD-10-CM | POA: Diagnosis not present

## 2019-10-04 DIAGNOSIS — E119 Type 2 diabetes mellitus without complications: Secondary | ICD-10-CM | POA: Diagnosis not present

## 2019-10-04 DIAGNOSIS — Z299 Encounter for prophylactic measures, unspecified: Secondary | ICD-10-CM | POA: Diagnosis not present

## 2019-10-04 DIAGNOSIS — I5030 Unspecified diastolic (congestive) heart failure: Secondary | ICD-10-CM | POA: Diagnosis not present

## 2019-10-04 DIAGNOSIS — J441 Chronic obstructive pulmonary disease with (acute) exacerbation: Secondary | ICD-10-CM | POA: Diagnosis not present

## 2019-10-04 DIAGNOSIS — I10 Essential (primary) hypertension: Secondary | ICD-10-CM | POA: Diagnosis not present

## 2019-10-04 DIAGNOSIS — Z6841 Body Mass Index (BMI) 40.0 and over, adult: Secondary | ICD-10-CM | POA: Diagnosis not present

## 2019-10-04 DIAGNOSIS — E1140 Type 2 diabetes mellitus with diabetic neuropathy, unspecified: Secondary | ICD-10-CM | POA: Diagnosis not present

## 2019-10-16 DIAGNOSIS — F17210 Nicotine dependence, cigarettes, uncomplicated: Secondary | ICD-10-CM | POA: Diagnosis not present

## 2019-10-16 DIAGNOSIS — Z6841 Body Mass Index (BMI) 40.0 and over, adult: Secondary | ICD-10-CM | POA: Diagnosis not present

## 2019-10-16 DIAGNOSIS — J449 Chronic obstructive pulmonary disease, unspecified: Secondary | ICD-10-CM | POA: Diagnosis not present

## 2019-10-16 DIAGNOSIS — Z299 Encounter for prophylactic measures, unspecified: Secondary | ICD-10-CM | POA: Diagnosis not present

## 2019-10-16 DIAGNOSIS — I10 Essential (primary) hypertension: Secondary | ICD-10-CM | POA: Diagnosis not present

## 2019-10-16 DIAGNOSIS — E1165 Type 2 diabetes mellitus with hyperglycemia: Secondary | ICD-10-CM | POA: Diagnosis not present

## 2019-10-16 DIAGNOSIS — I82409 Acute embolism and thrombosis of unspecified deep veins of unspecified lower extremity: Secondary | ICD-10-CM | POA: Diagnosis not present

## 2019-10-31 DIAGNOSIS — Z6841 Body Mass Index (BMI) 40.0 and over, adult: Secondary | ICD-10-CM | POA: Diagnosis not present

## 2019-10-31 DIAGNOSIS — F17210 Nicotine dependence, cigarettes, uncomplicated: Secondary | ICD-10-CM | POA: Diagnosis not present

## 2019-10-31 DIAGNOSIS — L4050 Arthropathic psoriasis, unspecified: Secondary | ICD-10-CM | POA: Diagnosis not present

## 2019-10-31 DIAGNOSIS — E1165 Type 2 diabetes mellitus with hyperglycemia: Secondary | ICD-10-CM | POA: Diagnosis not present

## 2019-10-31 DIAGNOSIS — E1142 Type 2 diabetes mellitus with diabetic polyneuropathy: Secondary | ICD-10-CM | POA: Diagnosis not present

## 2019-10-31 DIAGNOSIS — I10 Essential (primary) hypertension: Secondary | ICD-10-CM | POA: Diagnosis not present

## 2019-10-31 DIAGNOSIS — Z299 Encounter for prophylactic measures, unspecified: Secondary | ICD-10-CM | POA: Diagnosis not present

## 2019-10-31 DIAGNOSIS — J449 Chronic obstructive pulmonary disease, unspecified: Secondary | ICD-10-CM | POA: Diagnosis not present

## 2019-11-04 DIAGNOSIS — E119 Type 2 diabetes mellitus without complications: Secondary | ICD-10-CM | POA: Diagnosis not present

## 2019-11-06 DIAGNOSIS — I5043 Acute on chronic combined systolic (congestive) and diastolic (congestive) heart failure: Secondary | ICD-10-CM | POA: Diagnosis not present

## 2019-11-06 DIAGNOSIS — E1140 Type 2 diabetes mellitus with diabetic neuropathy, unspecified: Secondary | ICD-10-CM | POA: Diagnosis not present

## 2019-11-06 DIAGNOSIS — I110 Hypertensive heart disease with heart failure: Secondary | ICD-10-CM | POA: Diagnosis not present

## 2019-11-15 DIAGNOSIS — I10 Essential (primary) hypertension: Secondary | ICD-10-CM | POA: Diagnosis not present

## 2019-11-15 DIAGNOSIS — E119 Type 2 diabetes mellitus without complications: Secondary | ICD-10-CM | POA: Diagnosis not present

## 2019-11-15 DIAGNOSIS — I2510 Atherosclerotic heart disease of native coronary artery without angina pectoris: Secondary | ICD-10-CM | POA: Diagnosis not present

## 2019-11-15 DIAGNOSIS — M159 Polyosteoarthritis, unspecified: Secondary | ICD-10-CM | POA: Diagnosis not present

## 2019-11-30 DIAGNOSIS — J9611 Chronic respiratory failure with hypoxia: Secondary | ICD-10-CM | POA: Diagnosis not present

## 2019-11-30 DIAGNOSIS — I82409 Acute embolism and thrombosis of unspecified deep veins of unspecified lower extremity: Secondary | ICD-10-CM | POA: Diagnosis not present

## 2019-11-30 DIAGNOSIS — J449 Chronic obstructive pulmonary disease, unspecified: Secondary | ICD-10-CM | POA: Diagnosis not present

## 2019-11-30 DIAGNOSIS — F17210 Nicotine dependence, cigarettes, uncomplicated: Secondary | ICD-10-CM | POA: Diagnosis not present

## 2019-11-30 DIAGNOSIS — E1165 Type 2 diabetes mellitus with hyperglycemia: Secondary | ICD-10-CM | POA: Diagnosis not present

## 2019-11-30 DIAGNOSIS — Z299 Encounter for prophylactic measures, unspecified: Secondary | ICD-10-CM | POA: Diagnosis not present

## 2019-12-04 DIAGNOSIS — E119 Type 2 diabetes mellitus without complications: Secondary | ICD-10-CM | POA: Diagnosis not present

## 2019-12-13 DIAGNOSIS — I10 Essential (primary) hypertension: Secondary | ICD-10-CM | POA: Diagnosis not present

## 2019-12-13 DIAGNOSIS — I2510 Atherosclerotic heart disease of native coronary artery without angina pectoris: Secondary | ICD-10-CM | POA: Diagnosis not present

## 2019-12-13 DIAGNOSIS — E119 Type 2 diabetes mellitus without complications: Secondary | ICD-10-CM | POA: Diagnosis not present

## 2019-12-13 DIAGNOSIS — M159 Polyosteoarthritis, unspecified: Secondary | ICD-10-CM | POA: Diagnosis not present

## 2019-12-19 DIAGNOSIS — E1165 Type 2 diabetes mellitus with hyperglycemia: Secondary | ICD-10-CM | POA: Diagnosis not present

## 2019-12-19 DIAGNOSIS — L4050 Arthropathic psoriasis, unspecified: Secondary | ICD-10-CM | POA: Diagnosis not present

## 2019-12-19 DIAGNOSIS — Z299 Encounter for prophylactic measures, unspecified: Secondary | ICD-10-CM | POA: Diagnosis not present

## 2019-12-19 DIAGNOSIS — M79605 Pain in left leg: Secondary | ICD-10-CM | POA: Diagnosis not present

## 2019-12-19 DIAGNOSIS — J441 Chronic obstructive pulmonary disease with (acute) exacerbation: Secondary | ICD-10-CM | POA: Diagnosis not present

## 2019-12-25 DIAGNOSIS — E1140 Type 2 diabetes mellitus with diabetic neuropathy, unspecified: Secondary | ICD-10-CM | POA: Diagnosis not present

## 2019-12-25 DIAGNOSIS — I5043 Acute on chronic combined systolic (congestive) and diastolic (congestive) heart failure: Secondary | ICD-10-CM | POA: Diagnosis not present

## 2019-12-25 DIAGNOSIS — K8010 Calculus of gallbladder with chronic cholecystitis without obstruction: Secondary | ICD-10-CM | POA: Diagnosis not present

## 2019-12-25 DIAGNOSIS — I110 Hypertensive heart disease with heart failure: Secondary | ICD-10-CM | POA: Diagnosis not present

## 2020-01-04 DIAGNOSIS — E119 Type 2 diabetes mellitus without complications: Secondary | ICD-10-CM | POA: Diagnosis not present

## 2020-01-10 IMAGING — DX DG CHEST 1V PORT
1 series · 1 of 1 positions shown · non-contrast
Comparison: 10/18/2018 and prior studies.

CLINICAL DATA: Acute hypoxemic respiratory failure. Follow-up exam.

EXAM:
PORTABLE CHEST 1 VIEW

[chest ap]
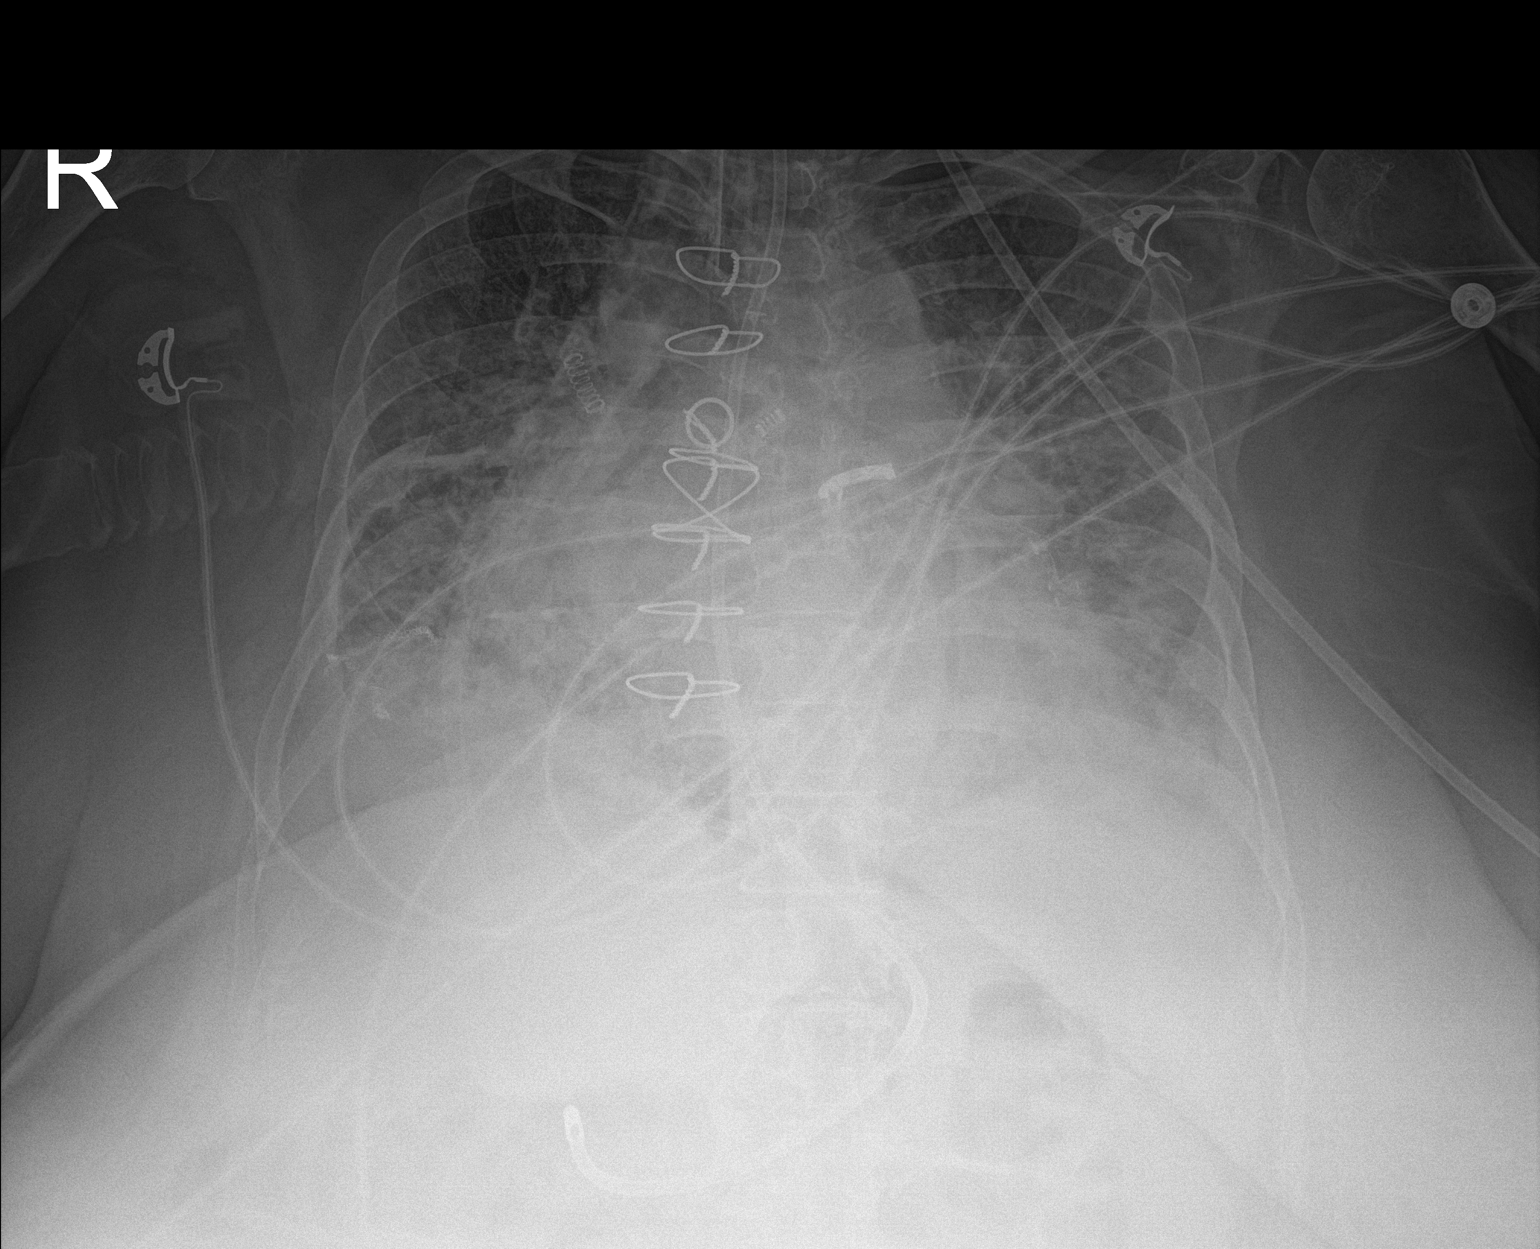

[1 of 1 positions shown; findings below may reference images not displayed]

FINDINGS: Vascular congestion, bilateral interstitial thickening and mild hazy
opacity at the lung bases is similar to the prior exam. Probable
small pleural effusions. There are stable anastomosis staples at the
right lung base.

Cardiac silhouette is enlarged. Tracheostomy tube and enteric
feeding tube are stable.
IMPRESSION: 1. No significant change when compared to the previous day's study.
Cardiomegaly with vascular [place] interstitial edema consistent with
CHF. Support apparatus is stable.

## 2020-01-15 DIAGNOSIS — F17200 Nicotine dependence, unspecified, uncomplicated: Secondary | ICD-10-CM | POA: Diagnosis not present

## 2020-01-15 DIAGNOSIS — I509 Heart failure, unspecified: Secondary | ICD-10-CM | POA: Diagnosis not present

## 2020-01-15 DIAGNOSIS — Z794 Long term (current) use of insulin: Secondary | ICD-10-CM | POA: Diagnosis not present

## 2020-01-15 DIAGNOSIS — S299XXA Unspecified injury of thorax, initial encounter: Secondary | ICD-10-CM | POA: Diagnosis not present

## 2020-01-15 DIAGNOSIS — Z951 Presence of aortocoronary bypass graft: Secondary | ICD-10-CM | POA: Diagnosis not present

## 2020-01-15 DIAGNOSIS — M25562 Pain in left knee: Secondary | ICD-10-CM | POA: Diagnosis not present

## 2020-01-15 DIAGNOSIS — S0990XA Unspecified injury of head, initial encounter: Secondary | ICD-10-CM | POA: Diagnosis not present

## 2020-01-15 DIAGNOSIS — Z86718 Personal history of other venous thrombosis and embolism: Secondary | ICD-10-CM | POA: Diagnosis not present

## 2020-01-15 DIAGNOSIS — I110 Hypertensive heart disease with heart failure: Secondary | ICD-10-CM | POA: Diagnosis not present

## 2020-01-15 DIAGNOSIS — Z79899 Other long term (current) drug therapy: Secondary | ICD-10-CM | POA: Diagnosis not present

## 2020-01-15 DIAGNOSIS — Z7902 Long term (current) use of antithrombotics/antiplatelets: Secondary | ICD-10-CM | POA: Diagnosis not present

## 2020-01-15 DIAGNOSIS — S42302A Unspecified fracture of shaft of humerus, left arm, initial encounter for closed fracture: Secondary | ICD-10-CM | POA: Diagnosis not present

## 2020-01-15 DIAGNOSIS — M25532 Pain in left wrist: Secondary | ICD-10-CM | POA: Diagnosis not present

## 2020-01-15 DIAGNOSIS — E119 Type 2 diabetes mellitus without complications: Secondary | ICD-10-CM | POA: Diagnosis not present

## 2020-01-15 DIAGNOSIS — Z955 Presence of coronary angioplasty implant and graft: Secondary | ICD-10-CM | POA: Diagnosis not present

## 2020-01-15 DIAGNOSIS — Z7901 Long term (current) use of anticoagulants: Secondary | ICD-10-CM | POA: Diagnosis not present

## 2020-01-15 DIAGNOSIS — M79602 Pain in left arm: Secondary | ICD-10-CM | POA: Diagnosis not present

## 2020-01-15 DIAGNOSIS — Z880 Allergy status to penicillin: Secondary | ICD-10-CM | POA: Diagnosis not present

## 2020-01-15 DIAGNOSIS — Z833 Family history of diabetes mellitus: Secondary | ICD-10-CM | POA: Diagnosis not present

## 2020-01-15 DIAGNOSIS — S8992XA Unspecified injury of left lower leg, initial encounter: Secondary | ICD-10-CM | POA: Diagnosis not present

## 2020-01-15 DIAGNOSIS — S6992XA Unspecified injury of left wrist, hand and finger(s), initial encounter: Secondary | ICD-10-CM | POA: Diagnosis not present

## 2020-01-15 DIAGNOSIS — I252 Old myocardial infarction: Secondary | ICD-10-CM | POA: Diagnosis not present

## 2020-01-28 DIAGNOSIS — L4050 Arthropathic psoriasis, unspecified: Secondary | ICD-10-CM | POA: Diagnosis not present

## 2020-01-28 DIAGNOSIS — E1165 Type 2 diabetes mellitus with hyperglycemia: Secondary | ICD-10-CM | POA: Diagnosis not present

## 2020-01-28 DIAGNOSIS — I5042 Chronic combined systolic (congestive) and diastolic (congestive) heart failure: Secondary | ICD-10-CM | POA: Diagnosis not present

## 2020-01-28 DIAGNOSIS — I10 Essential (primary) hypertension: Secondary | ICD-10-CM | POA: Diagnosis not present

## 2020-01-28 DIAGNOSIS — Z299 Encounter for prophylactic measures, unspecified: Secondary | ICD-10-CM | POA: Diagnosis not present

## 2020-02-03 DIAGNOSIS — I10 Essential (primary) hypertension: Secondary | ICD-10-CM | POA: Diagnosis not present

## 2020-02-03 DIAGNOSIS — M159 Polyosteoarthritis, unspecified: Secondary | ICD-10-CM | POA: Diagnosis not present

## 2020-02-03 DIAGNOSIS — E119 Type 2 diabetes mellitus without complications: Secondary | ICD-10-CM | POA: Diagnosis not present

## 2020-02-03 DIAGNOSIS — I2510 Atherosclerotic heart disease of native coronary artery without angina pectoris: Secondary | ICD-10-CM | POA: Diagnosis not present

## 2020-02-05 DIAGNOSIS — I498 Other specified cardiac arrhythmias: Secondary | ICD-10-CM | POA: Diagnosis not present

## 2020-02-05 DIAGNOSIS — I429 Cardiomyopathy, unspecified: Secondary | ICD-10-CM | POA: Diagnosis not present

## 2020-02-05 DIAGNOSIS — I4891 Unspecified atrial fibrillation: Secondary | ICD-10-CM | POA: Diagnosis not present

## 2020-02-05 DIAGNOSIS — R0602 Shortness of breath: Secondary | ICD-10-CM | POA: Diagnosis not present

## 2020-02-05 DIAGNOSIS — I469 Cardiac arrest, cause unspecified: Secondary | ICD-10-CM | POA: Diagnosis not present

## 2020-02-05 DIAGNOSIS — I10 Essential (primary) hypertension: Secondary | ICD-10-CM | POA: Diagnosis not present

## 2020-02-05 DIAGNOSIS — Z952 Presence of prosthetic heart valve: Secondary | ICD-10-CM | POA: Diagnosis not present

## 2020-02-11 DIAGNOSIS — E1165 Type 2 diabetes mellitus with hyperglycemia: Secondary | ICD-10-CM | POA: Diagnosis not present

## 2020-02-11 DIAGNOSIS — Z299 Encounter for prophylactic measures, unspecified: Secondary | ICD-10-CM | POA: Diagnosis not present

## 2020-02-11 DIAGNOSIS — I5042 Chronic combined systolic (congestive) and diastolic (congestive) heart failure: Secondary | ICD-10-CM | POA: Diagnosis not present

## 2020-02-11 DIAGNOSIS — I10 Essential (primary) hypertension: Secondary | ICD-10-CM | POA: Diagnosis not present

## 2020-02-11 DIAGNOSIS — F17210 Nicotine dependence, cigarettes, uncomplicated: Secondary | ICD-10-CM | POA: Diagnosis not present

## 2020-02-11 DIAGNOSIS — J449 Chronic obstructive pulmonary disease, unspecified: Secondary | ICD-10-CM | POA: Diagnosis not present

## 2020-02-11 DIAGNOSIS — E1142 Type 2 diabetes mellitus with diabetic polyneuropathy: Secondary | ICD-10-CM | POA: Diagnosis not present

## 2020-03-05 DIAGNOSIS — I2510 Atherosclerotic heart disease of native coronary artery without angina pectoris: Secondary | ICD-10-CM | POA: Diagnosis not present

## 2020-03-05 DIAGNOSIS — E119 Type 2 diabetes mellitus without complications: Secondary | ICD-10-CM | POA: Diagnosis not present

## 2020-03-05 DIAGNOSIS — I10 Essential (primary) hypertension: Secondary | ICD-10-CM | POA: Diagnosis not present

## 2020-03-05 DIAGNOSIS — M159 Polyosteoarthritis, unspecified: Secondary | ICD-10-CM | POA: Diagnosis not present

## 2020-04-04 DIAGNOSIS — I2510 Atherosclerotic heart disease of native coronary artery without angina pectoris: Secondary | ICD-10-CM | POA: Diagnosis not present

## 2020-04-04 DIAGNOSIS — E119 Type 2 diabetes mellitus without complications: Secondary | ICD-10-CM | POA: Diagnosis not present

## 2020-04-04 DIAGNOSIS — M159 Polyosteoarthritis, unspecified: Secondary | ICD-10-CM | POA: Diagnosis not present

## 2020-04-04 DIAGNOSIS — I10 Essential (primary) hypertension: Secondary | ICD-10-CM | POA: Diagnosis not present

## 2020-04-15 DIAGNOSIS — I5030 Unspecified diastolic (congestive) heart failure: Secondary | ICD-10-CM | POA: Diagnosis not present

## 2020-04-15 DIAGNOSIS — I82409 Acute embolism and thrombosis of unspecified deep veins of unspecified lower extremity: Secondary | ICD-10-CM | POA: Diagnosis not present

## 2020-04-15 DIAGNOSIS — J069 Acute upper respiratory infection, unspecified: Secondary | ICD-10-CM | POA: Diagnosis not present

## 2020-04-15 DIAGNOSIS — I10 Essential (primary) hypertension: Secondary | ICD-10-CM | POA: Diagnosis not present

## 2020-04-15 DIAGNOSIS — Z299 Encounter for prophylactic measures, unspecified: Secondary | ICD-10-CM | POA: Diagnosis not present

## 2020-04-15 DIAGNOSIS — Z6841 Body Mass Index (BMI) 40.0 and over, adult: Secondary | ICD-10-CM | POA: Diagnosis not present

## 2020-04-15 DIAGNOSIS — I80209 Phlebitis and thrombophlebitis of unspecified deep vessels of unspecified lower extremity: Secondary | ICD-10-CM | POA: Diagnosis not present

## 2020-04-15 DIAGNOSIS — E1165 Type 2 diabetes mellitus with hyperglycemia: Secondary | ICD-10-CM | POA: Diagnosis not present

## 2020-04-17 ENCOUNTER — Other Ambulatory Visit: Payer: Self-pay

## 2020-04-17 ENCOUNTER — Encounter (HOSPITAL_COMMUNITY): Payer: Self-pay | Admitting: *Deleted

## 2020-04-17 ENCOUNTER — Inpatient Hospital Stay (HOSPITAL_COMMUNITY)
Admission: EM | Admit: 2020-04-17 | Discharge: 2020-04-25 | DRG: 291 | Disposition: A | Discharge status: Home Health | Payer: Medicare Other | Attending: Internal Medicine | Admitting: Internal Medicine

## 2020-04-17 ENCOUNTER — Telehealth: Payer: Self-pay | Admitting: Cardiology

## 2020-04-17 ENCOUNTER — Emergency Department (HOSPITAL_COMMUNITY): Payer: Medicare Other

## 2020-04-17 DIAGNOSIS — J9600 Acute respiratory failure, unspecified whether with hypoxia or hypercapnia: Secondary | ICD-10-CM | POA: Insufficient documentation

## 2020-04-17 DIAGNOSIS — Z8541 Personal history of malignant neoplasm of cervix uteri: Secondary | ICD-10-CM

## 2020-04-17 DIAGNOSIS — Z955 Presence of coronary angioplasty implant and graft: Secondary | ICD-10-CM | POA: Diagnosis not present

## 2020-04-17 DIAGNOSIS — I2510 Atherosclerotic heart disease of native coronary artery without angina pectoris: Secondary | ICD-10-CM | POA: Diagnosis present

## 2020-04-17 DIAGNOSIS — I82409 Acute embolism and thrombosis of unspecified deep veins of unspecified lower extremity: Secondary | ICD-10-CM | POA: Diagnosis not present

## 2020-04-17 DIAGNOSIS — I5023 Acute on chronic systolic (congestive) heart failure: Secondary | ICD-10-CM | POA: Diagnosis not present

## 2020-04-17 DIAGNOSIS — I5043 Acute on chronic combined systolic (congestive) and diastolic (congestive) heart failure: Secondary | ICD-10-CM | POA: Diagnosis present

## 2020-04-17 DIAGNOSIS — I471 Supraventricular tachycardia: Secondary | ICD-10-CM | POA: Diagnosis present

## 2020-04-17 DIAGNOSIS — Z8616 Personal history of COVID-19: Secondary | ICD-10-CM | POA: Diagnosis not present

## 2020-04-17 DIAGNOSIS — I272 Pulmonary hypertension, unspecified: Secondary | ICD-10-CM | POA: Diagnosis present

## 2020-04-17 DIAGNOSIS — I509 Heart failure, unspecified: Secondary | ICD-10-CM | POA: Diagnosis not present

## 2020-04-17 DIAGNOSIS — Z6841 Body Mass Index (BMI) 40.0 and over, adult: Secondary | ICD-10-CM | POA: Diagnosis not present

## 2020-04-17 DIAGNOSIS — Z794 Long term (current) use of insulin: Secondary | ICD-10-CM

## 2020-04-17 DIAGNOSIS — E785 Hyperlipidemia, unspecified: Secondary | ICD-10-CM | POA: Diagnosis present

## 2020-04-17 DIAGNOSIS — Z930 Tracheostomy status: Secondary | ICD-10-CM | POA: Diagnosis not present

## 2020-04-17 DIAGNOSIS — J449 Chronic obstructive pulmonary disease, unspecified: Secondary | ICD-10-CM | POA: Diagnosis present

## 2020-04-17 DIAGNOSIS — Z86718 Personal history of other venous thrombosis and embolism: Secondary | ICD-10-CM

## 2020-04-17 DIAGNOSIS — Z7901 Long term (current) use of anticoagulants: Secondary | ICD-10-CM

## 2020-04-17 DIAGNOSIS — R0600 Dyspnea, unspecified: Secondary | ICD-10-CM | POA: Diagnosis not present

## 2020-04-17 DIAGNOSIS — I255 Ischemic cardiomyopathy: Secondary | ICD-10-CM | POA: Diagnosis present

## 2020-04-17 DIAGNOSIS — Z951 Presence of aortocoronary bypass graft: Secondary | ICD-10-CM

## 2020-04-17 DIAGNOSIS — Z79899 Other long term (current) drug therapy: Secondary | ICD-10-CM

## 2020-04-17 DIAGNOSIS — R0602 Shortness of breath: Secondary | ICD-10-CM | POA: Diagnosis not present

## 2020-04-17 DIAGNOSIS — I10 Essential (primary) hypertension: Secondary | ICD-10-CM | POA: Diagnosis not present

## 2020-04-17 DIAGNOSIS — F17210 Nicotine dependence, cigarettes, uncomplicated: Secondary | ICD-10-CM | POA: Diagnosis present

## 2020-04-17 DIAGNOSIS — E11649 Type 2 diabetes mellitus with hypoglycemia without coma: Secondary | ICD-10-CM | POA: Diagnosis not present

## 2020-04-17 DIAGNOSIS — E1165 Type 2 diabetes mellitus with hyperglycemia: Secondary | ICD-10-CM | POA: Diagnosis present

## 2020-04-17 DIAGNOSIS — I252 Old myocardial infarction: Secondary | ICD-10-CM | POA: Diagnosis not present

## 2020-04-17 DIAGNOSIS — D6862 Lupus anticoagulant syndrome: Secondary | ICD-10-CM | POA: Diagnosis present

## 2020-04-17 DIAGNOSIS — J9621 Acute and chronic respiratory failure with hypoxia: Secondary | ICD-10-CM | POA: Diagnosis present

## 2020-04-17 DIAGNOSIS — I517 Cardiomegaly: Secondary | ICD-10-CM | POA: Diagnosis not present

## 2020-04-17 DIAGNOSIS — I25110 Atherosclerotic heart disease of native coronary artery with unstable angina pectoris: Secondary | ICD-10-CM | POA: Diagnosis not present

## 2020-04-17 DIAGNOSIS — I110 Hypertensive heart disease with heart failure: Principal | ICD-10-CM | POA: Diagnosis present

## 2020-04-17 DIAGNOSIS — J9601 Acute respiratory failure with hypoxia: Secondary | ICD-10-CM

## 2020-04-17 DIAGNOSIS — I340 Nonrheumatic mitral (valve) insufficiency: Secondary | ICD-10-CM | POA: Diagnosis not present

## 2020-04-17 LAB — CBC
HCT: 38.3 % (ref 36.0–46.0)
Hemoglobin: 12 g/dL (ref 12.0–15.0)
MCH: 30.7 pg (ref 26.0–34.0)
MCHC: 31.3 g/dL (ref 30.0–36.0)
MCV: 98 fL (ref 80.0–100.0)
Platelets: 237 10*3/uL (ref 150–400)
RBC: 3.91 MIL/uL (ref 3.87–5.11)
RDW: 13.6 % (ref 11.5–15.5)
WBC: 8.7 10*3/uL (ref 4.0–10.5)
nRBC: 0 % (ref 0.0–0.2)

## 2020-04-17 LAB — TROPONIN I (HIGH SENSITIVITY)
Troponin I (High Sensitivity): 9 ng/L (ref <18)
Troponin I (High Sensitivity): 10 ng/L (ref <18)

## 2020-04-17 LAB — BASIC METABOLIC PANEL
Anion gap: 15 (ref 5–15)
BUN: 14 mg/dL (ref 6–20)
CO2: 34 mmol/L — ABNORMAL HIGH (ref 22–32)
Calcium: 9 mg/dL (ref 8.9–10.3)
Chloride: 90 mmol/L — ABNORMAL LOW (ref 98–111)
Creatinine, Ser: 0.97 mg/dL (ref 0.44–1.00)
GFR calc Af Amer: >60 mL/min (ref >60)
GFR calc non Af Amer: >60 mL/min (ref >60)
Glucose, Bld: 215 mg/dL — ABNORMAL HIGH (ref 70–99)
Potassium: 3.3 mmol/L — ABNORMAL LOW (ref 3.5–5.1)
Sodium: 139 mmol/L (ref 135–145)

## 2020-04-17 LAB — PROTIME-INR
INR: 1.1 (ref 0.8–1.2)
Prothrombin Time: 13.9 seconds (ref 11.4–15.2)

## 2020-04-17 LAB — SARS CORONAVIRUS 2 BY RT PCR (HOSPITAL ORDER, PERFORMED IN ~~LOC~~ HOSPITAL LAB): SARS Coronavirus 2: NEGATIVE

## 2020-04-17 LAB — BRAIN NATRIURETIC PEPTIDE: B Natriuretic Peptide: 550.3 pg/mL — ABNORMAL HIGH (ref 0.0–100.0)

## 2020-04-17 MED ORDER — FUROSEMIDE 10 MG/ML IJ SOLN
80.0000 mg | Freq: Once | INTRAMUSCULAR | Status: AC
  Administered 2020-04-17: 80 mg via INTRAVENOUS
  Filled 2020-04-17: qty 8

## 2020-04-17 MED ORDER — METHYLPREDNISOLONE SODIUM SUCC 125 MG IJ SOLR
125.0000 mg | Freq: Once | INTRAMUSCULAR | Status: AC
  Administered 2020-04-17: 125 mg via INTRAVENOUS
  Filled 2020-04-17: qty 2

## 2020-04-17 MED ORDER — GABAPENTIN 300 MG PO CAPS
900.0000 mg | ORAL_CAPSULE | Freq: Every day | ORAL | Status: DC
  Administered 2020-04-17 – 2020-04-24 (×8): 900 mg via ORAL
  Filled 2020-04-17 (×8): qty 3

## 2020-04-17 NOTE — ED Notes (Addendum)
Noted pt destated to 87% while sleeping on baseline of 4 L. RN increased O2 6 L/min to keep spo2 > 90%. No respiratory distress noted. Dr. Hal Hope informed.

## 2020-04-17 NOTE — ED Provider Notes (Signed)
[facility] EMERGENCY DEPARTMENT Provider Note   CSN: [id number] Arrival date & time: 04/17/20  1906     History Chief Complaint  Patient presents with  . Shortness of Breath    Theresa [name] is a 54 y.o. female with PMHx CAD s/p CABG, CHF with EF 40-45%, COPD, Diabetes, Lupus anticoagulant disorder, with tracheostomy who presents to the ED today with complaint of gradual onset, constant, worsening SOB x 2-3 days as well as bilateral lower extremity swelling and abdominal distention. Pt also reports a 5 pound weight gain in the past week. Pt saw her PCP yesterday who increased her Torsemide from 60 mg daily to 120 mg daily yesterday without improvement. Pt called her cardiologist Dr. Percival Spanish today who advised pt come to the ED to be evaluated. Pt denies fevers, chills, chest pain, hemoptysis, or any other associated symptoms.   The history is provided by the patient and medical records.       Past Medical History:  Diagnosis Date  . Acute kidney failure with lesion of tubular necrosis (HCC)   . Acute systolic heart failure ([place])   . CAD (coronary artery disease)    a. s/p prior PCI. b. CABG 2007 at [facility] in [place] 2007. c. inferior STEMI 10/2015 s/p DES to dSVG-PDA.  . Cardiac arrest ([place])   . Cervical cancer ([place])   . Chronic diastolic CHF (congestive heart failure) ([place])   . Chronic respiratory failure ([place])    s/p tracheostomy 2002  . Chronic RUQ pain   . COPD (chronic obstructive pulmonary disease) ([place])   . Diabetes mellitus ([place])   . DVT (deep venous thrombosis) ([place])   . Endometriosis   . History of gallstones 01/2016   seen on Ultrasound  . History of HIDA scan 11/2016   normal  . HTN (hypertension)   . Hyperlipidemia   . Lupus anticoagulant disorder (HCC)    on coumadin  . Morbid obesity ([place])   . ST elevation (STEMI) myocardial infarction involving right coronary artery ([place]) 10/29/15   stent to VG to PDA  . Toe fracture,  right 03/29/2018  . Tracheostomy in place [facility]), chronic since 2002 11/03/2015  . Tracheostomy status [facility])     Patient Active Problem List   Diagnosis Date Noted  . Chronic venous insufficiency 06/19/2019  . Cholelithiasis 05/07/2019  . Adult body mass index 50.0-59.9 ([place]) 03/22/2019  . Anxiety 03/22/2019  . Low back pain 03/22/2019  . Cigarette nicotine dependence without complication 62/13/0865  . History of vitamin D deficiency 03/22/2019  . Insomnia 03/22/2019  . Knee pain 03/22/2019  . Localized, primary osteoarthritis of lower leg, unspecified laterality 03/22/2019  . Neck pain 03/22/2019  . Psoriasis 03/22/2019  . Thrombophlebitis of deep veins of lower extremity ([place]) 03/22/2019  . Deep vein thrombosis ([place]) 03/22/2019  . Fracture of humeral shaft, left, closed 03/22/2019  . Chronic respiratory insufficiency 01/19/2019  . CAD (coronary artery disease) 01/15/2019  . Educated about COVID-19 virus infection 01/12/2019  . Acute on chronic combined systolic and diastolic HF (heart failure) ([place]) 01/12/2019  . Acute on chronic respiratory failure with hypoxia ([place]) 10/24/2018  . Pulmonary hypertension, unspecified ([place])   . Acute on chronic respiratory failure with hypoxia ([place]) 08/01/2017  . Uncontrolled type 2 diabetes mellitus with hyperglycemia, with long-term current use of insulin ([place]) 08/01/2017  . Dyspnea 07/31/2017  . Type 2 diabetes mellitus with hyperglycemia ([place]) 01/22/2016  . CAD (coronary artery disease) of artery bypass graft:  PTCA/DES to distal body VG to PDA 10/29/15 11/03/2015  . Tracheostomy in place [facility]), chronic since 2002 11/03/2015  . Hypokalemia 11/03/2015  . Lupus anticoagulant syndrome ([place])   . Chronic diastolic CHF (congestive heart failure) ([place])   . Respiratory failure ([place])   . Coronary artery disease involving coronary bypass graft of native heart without angina pectoris   . OSA (obstructive sleep apnea)   . COPD exacerbation ([place])   .  Tobacco abuse   . DM (diabetes mellitus) ([place]) 06/11/2009  . Hyperlipidemia LDL goal <70 06/11/2009  . DVT 06/11/2009  . History of CABG 06/11/2009  . Essential hypertension 05/27/2009  . CAD, NATIVE VESSEL 05/27/2009    Past Surgical History:  Procedure Laterality Date  . CARDIAC CATHETERIZATION N/A 10/29/2015   Procedure: Left Heart Cath and Cors/Grafts Angiography;  Surgeon: Burnell Blanks, MD;  Location: [place] [facility];  Service: Cardiovascular;  Laterality: N/A;  . CARDIAC CATHETERIZATION  10/29/2015   Procedure: Coronary Stent Intervention;  Surgeon: Burnell Blanks, MD;  Location: [place] [facility];  Service: Cardiovascular;;  . CAROTID STENT    . CESAREAN SECTION WITH BILATERAL TUBAL LIGATION    . CORONARY ARTERY BYPASS GRAFT  2007   2V  . IR GASTROSTOMY TUBE MOD SED  11/13/2018  . RIGHT/LEFT HEART CATH AND CORONARY/GRAFT ANGIOGRAPHY N/A 03/24/2018   Procedure: RIGHT/LEFT HEART CATH AND CORONARY/GRAFT ANGIOGRAPHY;  Surgeon: Belva Crome, MD;  Location: [place] [facility];  Service: Cardiovascular;  Laterality: N/A;  . TRACHEOSTOMY       OB History   No obstetric history on file.     Family History  Problem Relation Age of Onset  . Hypertension Mother   . Diabetes Mother   . Hypertension Father   . Diabetes Father     Social History   Tobacco Use  . Smoking status: Current Every Day Smoker    Packs/day: 0.75    Types: Cigarettes    Start date: 10/23/1978  . Smokeless tobacco: Never Used  . Tobacco comment: 1/2 pack - trying to cut back   Vaping Use  . Vaping Use: Never used  Substance Use Topics  . Alcohol use: No    Alcohol/week: 0.0 standard drinks  . Drug use: No    Home Medications Prior to Admission medications   Medication Sig Start Date End Date Taking? Authorizing Provider  albuterol (PROVENTIL) (2.5 MG/3ML) 0.083% nebulizer solution Take 3 mLs (2.5 mg total) by nebulization 2 (two) times daily. Patient taking differently: Take  2.5 mg by nebulization every 4 (four) hours as needed for wheezing.  10/19/18   Erick Colace, NP  atorvastatin (LIPITOR) 20 MG tablet Take 1 tablet (20 mg total) by mouth daily. 01/19/19 04/17/20  Sande Rives E, PA-C  gabapentin (NEURONTIN) 300 MG capsule Take 1 capsule (300mg ) in the morning, 1 capsule (300mg ) in the afternoon, and 2 capsules (600mg ) at bedtime 01/19/19   Sande Rives E, PA-C  Insulin Degludec (TRESIBA FLEXTOUCH [place]) Inject 30 Units into the skin every morning.    [provider]  LORazepam (ATIVAN) 0.5 MG tablet Take 0.5 mg by mouth every 6 (six) hours as needed for anxiety.     [provider]  losartan (COZAAR) 25 MG tablet Take 1 tablet (25 mg total) by mouth daily. 01/20/19   Sande Rives E, PA-C  metoprolol succinate (TOPROL-XL) 50 MG 24 hr tablet Take 1 tablet (50 mg total) by mouth daily. 07/27/19   [name],  MD  nitroGLYCERIN (NITROSTAT) 0.4 MG SL tablet Place 0.4 mg under the tongue every 5 (five) minutes as needed for chest pain.    [provider]  OZEMPIC, 0.25 OR 0.5 MG/DOSE, 2 MG/1.5ML SOPN Inject 0.5 mg into the skin every Monday.  04/08/19   [provider]  sertraline (ZOLOFT) 50 MG tablet Take 50 mg by mouth daily.    [provider]  tamsulosin (FLOMAX) 0.4 MG CAPS capsule Take 0.4 mg by mouth daily.    [provider]  torsemide (DEMADEX) 20 MG tablet Take 3 tablets (60 mg total) by mouth daily. May take additional 60 mg in the afternoon as needed for > 3 lbs weight gain 05/17/19   Wendee Beavers T, MD  warfarin (COUMADIN) 2.5 MG tablet Take 2 tablets (5 mg) tonight at 6 PM.  Go to your PCP for INR tomorrow. 05/17/19   Mercy Riding, MD    Allergies    Penicillins, Ciprofloxacin, and Ibuprofen  Review of Systems   Review of Systems  Constitutional: Negative for chills and fever.  Respiratory: Positive for shortness of breath and wheezing.   Cardiovascular: Positive for leg swelling  (bilateral). Negative for chest pain.  Gastrointestinal: Positive for abdominal distention. Negative for abdominal pain, nausea and vomiting.  All other systems reviewed and are negative.   Physical Exam Updated Vital Signs BP (!) 88/61 (BP Location: Left Arm)   Pulse 76   Temp 98.9 F (37.2 C) (Oral)   Resp 16   Ht 4\' 9"  (1.448 m)   Wt 104.3 kg   SpO2 96%   BMI 49.77 kg/m   Physical Exam Vitals and nursing note reviewed.  Constitutional:      Appearance: She is obese. She is not ill-appearing or diaphoretic.  HENT:     Head: Normocephalic and atraumatic.     Comments: Trach in place Eyes:     Conjunctiva/sclera: Conjunctivae normal.  Cardiovascular:     Rate and Rhythm: Normal rate and regular rhythm.     Pulses: Normal pulses.  Pulmonary:     Breath sounds: Wheezing and rales present.     Comments: Satting 90% on chronic 4L O2. Able to speak in short sentences. Diffuse wheezing throughout.  Abdominal:     Tenderness: There is no abdominal tenderness. There is no guarding or rebound.     Comments: Diffuse abdominal distention   Musculoskeletal:     Cervical back: Neck supple.     Right lower leg: Edema present.     Left lower leg: Edema present.     Comments: 1+ pitting edema bilaterally  Skin:    General: Skin is warm and dry.  Neurological:     Mental Status: She is alert.     ED Results / Procedures / Treatments   Labs (all labs ordered are listed, but only abnormal results are displayed) Labs Reviewed  BASIC METABOLIC PANEL - Abnormal; Notable for the following components:      Result Value   Potassium 3.3 (*)    Chloride 90 (*)    CO2 34 (*)    Glucose, Bld 215 (*)    All other components within normal limits  BRAIN NATRIURETIC PEPTIDE - Abnormal; Notable for the following components:   B Natriuretic Peptide 550.3 (*)    All other components within normal limits  SARS CORONAVIRUS 2 BY RT PCR (HOSPITAL ORDER, [place] [facility])    CBC  PROTIME-INR  TROPONIN I (HIGH SENSITIVITY)  TROPONIN I (HIGH SENSITIVITY)    EKG EKG Interpretation  Date/Time:  Thursday April 17 2020 19:14:59 EDT Ventricular Rate:  77 PR Interval:  150 QRS Duration: 80 QT Interval:  402 QTC Calculation: 737 R Axis:   38 Text Interpretation: Normal sinus rhythm Cannot rule out Anterior infarct , age undetermined Lateral T wave inversions unchanged from prior ecgs, no STEMI Confirmed by Octaviano Glow [phone]) on 04/17/2020 8:12:16 PM   Radiology DG Chest 2 View  Result Date: 04/17/2020 CLINICAL DATA:  Dyspnea EXAM: CHEST - 2 VIEW COMPARISON:  01/15/2020 FINDINGS: Pulmonary insufflation remain stable since prior examination. Surgical staple lines related to partial right lung resection noted at the right lung base. There is perihilar and lower lung zone interstitial pulmonary infiltrate again identified, similar to prior examination, most in keeping with mild interstitial pulmonary edema, likely cardiogenic in nature. No pneumothorax or pleural effusion. Coronary artery bypass grafting has been performed. Mild to moderate cardiomegaly is stable. Tracheostomy is unchanged in position. Central pulmonary arterial enlargement is again noted, unchanged likely related to underlying pulmonary hypertension. No acute bone abnormality. IMPRESSION: Mild CHF, unchanged from prior exam Electronically Signed   By: Fidela Salisbury MD   On: 04/17/2020 19:52    Procedures Procedures (including critical care time)  Medications Ordered in ED Medications  furosemide (LASIX) injection 80 mg (has no administration in time range)  methylPREDNISolone sodium succinate (SOLU-MEDROL) 125 mg/2 mL injection 125 mg (125 mg Intravenous Given 04/17/20 2051)    ED Course  I have reviewed the triage vital signs and the nursing notes.  Pertinent labs & imaging results that were available during my care of the patient were reviewed by me and considered in my medical decision  making (see chart for details).    MDM Rules/Calculators/A&P                          54 year old female who has a history of CHF, COPD, on chronic 4 L O2 at home, trach dependent who presents to the ED today complaining of worsening shortness of breath and bilateral lower extremity edema for the past 2 to 3 days with 5 pound weight gain.  Advised to come to the ED by cardiologist Dr. Percival Spanish.  Recently had torsemide increased from 60 mg to 120 mg which she has been taking for a day.  On arrival to the ED patient is afebrile, nontachycardic and nontachypneic.  Initially hypotensive 88/61, repeat 111/61.  Satting 90% on 4 L which is her baseline however does appear to have increased work of breathing at this time. Pt reports she has had to increase to 5-6 L at home and states her pulse ox at one point read 63% with exertion. Patient does have diffuse wheezing and crackles throughout.  He also has 1+ bilateral pitting edema bilaterally with abd distention.  Lab work was obtained while patient was in the waiting room.  Chest x-ray does show mild congestive heart failure however no change from previous.  BC without leukocytosis.  Hemoglobin stable at 12.0.  BMP with a potassium 3.3, likely related to increase in torsemide recently.  Glucose 215, bicarb 34.  No gap.  Chloride 90.  INR 1.1, patient has a history of lupus anticoagulant currently on Coumadin.  Troponin of 9.  Will add on a BNP at this time to assess for fluid overload.  Have ordered Solu-Medrol given diffuse wheezing and history of COPD.  We will continue to monitor.  Pt has not been vaccinated against COVID; will test.   BNP 550.3; no significant increase however given pt's appearance of worsening abdominal distension and bilateral leg edema with increased WOB I do feel she requires diuresis at this time with admission. 80 mg IV Lasix ordered. Attending physician Dr. Langston Masker has evaluated patient as well and agrees with plan.   Discussed case with  Dr. Hal Hope with [facility] who agrees to evaluate patient for admission.   This note was prepared using Dragon voice recognition software and may include unintentional dictation errors due to the inherent limitations of voice recognition software.  Final Clinical Impression(s) / ED Diagnoses Final diagnoses:  Acute on chronic congestive heart failure, unspecified heart failure type [facility])    Rx / DC Orders ED Discharge Orders    None       Eustaquio Maize, PA-C 04/17/20 2301    Wyvonnia Dusky, MD 04/18/20 [phone]

## 2020-04-17 NOTE — Telephone Encounter (Signed)
LVM2CB 8/12

## 2020-04-17 NOTE — Telephone Encounter (Signed)
Called and spoke with pt, she states that her feet and legs are swelling badly and her belly is swollen. She states it is getting harder to breathe. She states that her O2 drops significantly if she gets up to move around or if she takes her O2 off. She also reports that when she sits down to relax at night she feels her heart "flutter". She saw here PCP yesterday (Dr.Shah) who increased her torsemide to "20mg  and I am taking 6 pills in the morning". She was told to see Dr.Hochrein soon. Pt has appt on 04/24/20 at 11:40 with Dr.Hochrein. Pt denies chest pain, she states she just will feel her heart fluttering or "rocking" in her chest.   Notified I would send this message to Dr.Hochrein and our pharmacist's to review and advise.  Pt verbalized understanding with no other questions at this time.

## 2020-04-17 NOTE — ED Triage Notes (Signed)
Pt from home for sob and BLE edema. Pt reports speaking with Dr.Hochrein who requested pt come in for further eval. Pt Has felt like her chest is fluttering when lying flat. Crackles noted bilaterally. Trach and oxygen dependent. Baseline on 4L [place], has been increased to 5-6. Pt noted to be hypotensive, 88/61; reports taking 120mg  diuretic this morning.

## 2020-04-17 NOTE — Telephone Encounter (Signed)
Called and spoke with pt, notified that our pharmacist's recommended she go to the ED for evaluation. Pt verbalized understanding. Asked if she had a ride or if she was going to call 911. Pt states that her husband was there and would take her. Notified I would call ahead a let them know she was coming. Pt verbalized understanding with no other questions at this time.  Called and notified Trish who verbalized understanding.

## 2020-04-17 NOTE — Telephone Encounter (Signed)
Theresa [name] is returning [place] call.

## 2020-04-17 NOTE — Telephone Encounter (Signed)
She should go to ED for evaluation.

## 2020-04-17 NOTE — Telephone Encounter (Signed)
Pt c/o swelling: STAT is pt has developed SOB within 24 hours  1) How much weight have you gained and in what time span? Gains 2-3 lbs and then loses 1 and then gains and loses again   2) If swelling, where is the swelling located? Legs and Stomach   3) Are you currently taking a fluid pill? Yes   4) Are you currently SOB? Not rn, but does upon exersion   5) Do you have a log of your daily weights (if so, list)? No   6) Have you gained 3 pounds in a day or 5 pounds in a week? 5 lbs in week   7) Have you traveled recently? No   Pt has an appt scheduled regarding this on 04/24/20.

## 2020-04-18 ENCOUNTER — Encounter (HOSPITAL_COMMUNITY): Payer: Self-pay | Admitting: Internal Medicine

## 2020-04-18 ENCOUNTER — Inpatient Hospital Stay (HOSPITAL_COMMUNITY): Payer: Medicare Other

## 2020-04-18 DIAGNOSIS — I509 Heart failure, unspecified: Secondary | ICD-10-CM

## 2020-04-18 DIAGNOSIS — I5023 Acute on chronic systolic (congestive) heart failure: Secondary | ICD-10-CM

## 2020-04-18 DIAGNOSIS — E1165 Type 2 diabetes mellitus with hyperglycemia: Secondary | ICD-10-CM

## 2020-04-18 DIAGNOSIS — Z794 Long term (current) use of insulin: Secondary | ICD-10-CM

## 2020-04-18 LAB — CBC
HCT: 37.7 % (ref 36.0–46.0)
Hemoglobin: 12.1 g/dL (ref 12.0–15.0)
MCH: 30.8 pg (ref 26.0–34.0)
MCHC: 32.1 g/dL (ref 30.0–36.0)
MCV: 95.9 fL (ref 80.0–100.0)
Platelets: 209 10*3/uL (ref 150–400)
RBC: 3.93 MIL/uL (ref 3.87–5.11)
RDW: 13.5 % (ref 11.5–15.5)
WBC: 8.4 10*3/uL (ref 4.0–10.5)
nRBC: 0 % (ref 0.0–0.2)

## 2020-04-18 LAB — BASIC METABOLIC PANEL
Anion gap: 19 — ABNORMAL HIGH (ref 5–15)
BUN: 18 mg/dL (ref 6–20)
CO2: 26 mmol/L (ref 22–32)
Calcium: 8.5 mg/dL — ABNORMAL LOW (ref 8.9–10.3)
Chloride: 90 mmol/L — ABNORMAL LOW (ref 98–111)
Creatinine, Ser: 0.97 mg/dL (ref 0.44–1.00)
GFR calc Af Amer: >60 mL/min (ref >60)
GFR calc non Af Amer: >60 mL/min (ref >60)
Glucose, Bld: 363 mg/dL — ABNORMAL HIGH (ref 70–99)
Potassium: 3.9 mmol/L (ref 3.5–5.1)
Sodium: 135 mmol/L (ref 135–145)

## 2020-04-18 LAB — GLUCOSE, CAPILLARY
Glucose-Capillary: 202 mg/dL — ABNORMAL HIGH (ref 70–99)
Glucose-Capillary: 280 mg/dL — ABNORMAL HIGH (ref 70–99)
Glucose-Capillary: 152 mg/dL — ABNORMAL HIGH (ref 70–99)
Glucose-Capillary: 224 mg/dL — ABNORMAL HIGH (ref 70–99)
Glucose-Capillary: 252 mg/dL — ABNORMAL HIGH (ref 70–99)

## 2020-04-18 LAB — D-DIMER, QUANTITATIVE: D-Dimer, Quant: 0.62 ug/mL-FEU — ABNORMAL HIGH (ref 0.00–0.50)

## 2020-04-18 LAB — PROTIME-INR
INR: 1.1 (ref 0.8–1.2)
Prothrombin Time: 14.1 seconds (ref 11.4–15.2)

## 2020-04-18 LAB — HIV ANTIBODY (ROUTINE TESTING W REFLEX): HIV Screen 4th Generation wRfx: NONREACTIVE

## 2020-04-18 LAB — TSH: TSH: 1.272 u[IU]/mL (ref 0.350–4.500)

## 2020-04-18 MED ORDER — METOPROLOL SUCCINATE ER 50 MG PO TB24
50.0000 mg | ORAL_TABLET | Freq: Every day | ORAL | Status: DC
  Administered 2020-04-19: 50 mg via ORAL
  Filled 2020-04-18 (×3): qty 1

## 2020-04-18 MED ORDER — TRAMADOL HCL 50 MG PO TABS
50.0000 mg | ORAL_TABLET | Freq: Four times a day (QID) | ORAL | Status: DC | PRN
  Administered 2020-04-20 – 2020-04-23 (×3): 50 mg via ORAL
  Filled 2020-04-18 (×3): qty 1

## 2020-04-18 MED ORDER — ORAL CARE MOUTH RINSE
15.0000 mL | Freq: Two times a day (BID) | OROMUCOSAL | Status: DC
  Administered 2020-04-18 – 2020-04-24 (×12): 15 mL via OROMUCOSAL

## 2020-04-18 MED ORDER — ONDANSETRON HCL 4 MG/2ML IJ SOLN: 4.0000 mg | Freq: Four times a day (QID) | INTRAMUSCULAR | Status: DC | PRN

## 2020-04-18 MED ORDER — LOSARTAN POTASSIUM 25 MG PO TABS
25.0000 mg | ORAL_TABLET | Freq: Every day | ORAL | Status: DC
  Administered 2020-04-19 – 2020-04-25 (×6): 25 mg via ORAL
  Filled 2020-04-18 (×8): qty 1

## 2020-04-18 MED ORDER — POTASSIUM CHLORIDE CRYS ER 20 MEQ PO TBCR
20.0000 meq | EXTENDED_RELEASE_TABLET | Freq: Every day | ORAL | Status: DC
  Administered 2020-04-18 – 2020-04-25 (×8): 20 meq via ORAL
  Filled 2020-04-18 (×8): qty 1

## 2020-04-18 MED ORDER — NITROGLYCERIN 0.4 MG SL SUBL: 0.4000 mg | SUBLINGUAL_TABLET | SUBLINGUAL | Status: DC | PRN

## 2020-04-18 MED ORDER — POTASSIUM CHLORIDE CRYS ER 20 MEQ PO TBCR
40.0000 meq | EXTENDED_RELEASE_TABLET | Freq: Once | ORAL | Status: AC
  Administered 2020-04-18: 40 meq via ORAL
  Filled 2020-04-18: qty 2

## 2020-04-18 MED ORDER — ALBUTEROL SULFATE (2.5 MG/3ML) 0.083% IN NEBU: 2.5000 mg | INHALATION_SOLUTION | RESPIRATORY_TRACT | Status: DC | PRN

## 2020-04-18 MED ORDER — SERTRALINE HCL 50 MG PO TABS
50.0000 mg | ORAL_TABLET | Freq: Every day | ORAL | Status: DC
  Administered 2020-04-18 – 2020-04-24 (×8): 50 mg via ORAL
  Filled 2020-04-18 (×8): qty 1

## 2020-04-18 MED ORDER — INSULIN GLARGINE 100 UNIT/ML ~~LOC~~ SOLN
50.0000 [IU] | Freq: Every day | SUBCUTANEOUS | Status: DC
  Administered 2020-04-18 – 2020-04-20 (×4): 50 [IU] via SUBCUTANEOUS
  Filled 2020-04-18 (×5): qty 0.5

## 2020-04-18 MED ORDER — LORAZEPAM 0.5 MG PO TABS
0.5000 mg | ORAL_TABLET | Freq: Four times a day (QID) | ORAL | Status: DC | PRN
  Administered 2020-04-20 – 2020-04-22 (×2): 0.5 mg via ORAL
  Filled 2020-04-18 (×2): qty 1

## 2020-04-18 MED ORDER — ONDANSETRON HCL 4 MG PO TABS: 4.0000 mg | ORAL_TABLET | Freq: Four times a day (QID) | ORAL | Status: DC | PRN

## 2020-04-18 MED ORDER — IOHEXOL 350 MG/ML SOLN
75.0000 mL | Freq: Once | INTRAVENOUS | Status: AC | PRN
  Administered 2020-04-18: 75 mL via INTRAVENOUS

## 2020-04-18 MED ORDER — PANTOPRAZOLE SODIUM 20 MG PO TBEC
20.0000 mg | DELAYED_RELEASE_TABLET | Freq: Every day | ORAL | Status: DC
  Administered 2020-04-18 – 2020-04-25 (×8): 20 mg via ORAL
  Filled 2020-04-18 (×8): qty 1

## 2020-04-18 MED ORDER — ATORVASTATIN CALCIUM 10 MG PO TABS
20.0000 mg | ORAL_TABLET | Freq: Every day | ORAL | Status: DC
  Administered 2020-04-18 – 2020-04-25 (×8): 20 mg via ORAL
  Filled 2020-04-18 (×8): qty 2

## 2020-04-18 MED ORDER — ENOXAPARIN SODIUM 40 MG/0.4ML ~~LOC~~ SOLN
40.0000 mg | SUBCUTANEOUS | Status: DC
  Administered 2020-04-18 – 2020-04-24 (×7): 40 mg via SUBCUTANEOUS
  Filled 2020-04-18 (×7): qty 0.4

## 2020-04-18 MED ORDER — FUROSEMIDE 10 MG/ML IJ SOLN
80.0000 mg | Freq: Two times a day (BID) | INTRAMUSCULAR | Status: DC
  Administered 2020-04-18 – 2020-04-21 (×5): 80 mg via INTRAVENOUS
  Filled 2020-04-18 (×6): qty 8

## 2020-04-18 MED ORDER — WARFARIN - PHARMACIST DOSING INPATIENT: Freq: Every day | Status: DC

## 2020-04-18 MED ORDER — WARFARIN SODIUM 7.5 MG PO TABS
7.5000 mg | ORAL_TABLET | Freq: Once | ORAL | Status: AC
  Administered 2020-04-18: 7.5 mg via ORAL
  Filled 2020-04-18: qty 1

## 2020-04-18 MED ORDER — TAMSULOSIN HCL 0.4 MG PO CAPS
0.4000 mg | ORAL_CAPSULE | Freq: Every day | ORAL | Status: DC
  Administered 2020-04-18 – 2020-04-25 (×8): 0.4 mg via ORAL
  Filled 2020-04-18 (×8): qty 1

## 2020-04-18 MED ORDER — CHLORHEXIDINE GLUCONATE 0.12 % MT SOLN
15.0000 mL | Freq: Two times a day (BID) | OROMUCOSAL | Status: DC
  Administered 2020-04-18 – 2020-04-25 (×15): 15 mL via OROMUCOSAL
  Filled 2020-04-18 (×15): qty 15

## 2020-04-18 MED ORDER — INSULIN ASPART 100 UNIT/ML ~~LOC~~ SOLN
0.0000 [IU] | Freq: Three times a day (TID) | SUBCUTANEOUS | Status: DC
  Administered 2020-04-18: 3 [IU] via SUBCUTANEOUS
  Administered 2020-04-18 (×2): 5 [IU] via SUBCUTANEOUS
  Administered 2020-04-19 – 2020-04-20 (×3): 2 [IU] via SUBCUTANEOUS
  Administered 2020-04-21 (×2): 1 [IU] via SUBCUTANEOUS
  Administered 2020-04-22: 3 [IU] via SUBCUTANEOUS
  Administered 2020-04-23 – 2020-04-25 (×5): 2 [IU] via SUBCUTANEOUS

## 2020-04-18 NOTE — Evaluation (Signed)
Physical Therapy Evaluation Patient Details Name: Theresa [name] MRN: [id number] DOB: 01-12-1966 Today's Date: 04/18/2020   History of Present Illness  Theresa [name] is a 54 y.o. female with history of CAD status post CABG status post stenting, chronic trach collar, chronic systolic heart failure last EF measured in 2020 was 40 to 45%, diabetes mellitus type 2, DVT on Coumadin presents to the ER with complaint of having increasing shortness of breath.  Patient states over the last 3 days patient has become progressively more short of breath than usual.  Increasing peripheral edema.  Shortness of breath increased on exertion.  No chest pain productive cough fever or chills.  Patient's primary care physician increased her torsemide dose by twice and patient took 1 dose of 120 mg of torsemide before coming to the ER.   Clinical Impression  Pt admitted with above diagnosis. Pt was able to stand at EOB and march in place but did desat with minimal activity to 87% on 80% trach collar therefore limited treatment and assisted pt back to bed.   Pt currently with functional limitations due to the deficits listed below (see PT Problem List). Pt will benefit from skilled PT to increase their independence and safety with mobility to allow discharge to the venue listed below.      Follow Up Recommendations Home health PT;Supervision/Assistance - 24 hour    Equipment Recommendations  None recommended by PT    Recommendations for Other Services       Precautions / Restrictions Precautions Precautions: Fall Restrictions Weight Bearing Restrictions: No      Mobility  Bed Mobility Overal bed mobility: Independent             General bed mobility comments: No physical assist needed.  Needed rest breaks due to sats as she fatigues quickly.   Transfers Overall transfer level: Needs assistance Equipment used: 2 person hand held assist Transfers: Sit to/from Stand Sit to Stand: Min guard          General transfer comment: Guard assist only as pt can stand and march in place at least [place]. Desat to 88% on 80% trach collar with just minimal activity and incr time to return to 90%.  Pt laid back down due to fatigue after marching in place.   Ambulation/Gait             General Gait Details: unable due to fatigue  Stairs            Wheelchair Mobility    Modified Rankin (Stroke Patients Only)       Balance Overall balance assessment: Needs assistance Sitting-balance support: No upper extremity supported;Feet supported Sitting balance-Leahy Scale: Fair     Standing balance support: No upper extremity supported;During functional activity Standing balance-Leahy Scale: Fair                               Pertinent Vitals/Pain Pain Assessment: No/denies pain    Home Living Family/patient expects to be discharged to:: Private residence Living Arrangements: Spouse/significant other Available Help at Discharge: Family;Available 24 hours/day Type of Home: House Home Access: Stairs to enter Entrance Stairs-Rails: Psychiatric nurse of Steps: 3 Home Layout: One level Home Equipment: Walker - 2 wheels;Cane - single point;Bedside commode;Wheelchair - manual;Tub bench;Shower seat (Chronic trach but was on 4LO2 at home)      Prior Function Level of Independence: Needs assistance   Gait / Transfers Assistance  Needed: independent without AD  ADL's / Homemaking Assistance Needed: assist with IADLs and help with socks  Comments: Pt states she used RW or cane at times     Hand Dominance        Extremity/Trunk Assessment   Upper Extremity Assessment Upper Extremity Assessment: Defer to OT evaluation    Lower Extremity Assessment Lower Extremity Assessment: Generalized weakness    Cervical / Trunk Assessment Cervical / Trunk Assessment: Normal  Communication   Communication: No difficulties (pt places finger over the  trach as her PMV is at home.)  Cognition Arousal/Alertness: Awake/alert Behavior During Therapy: WFL for tasks assessed/performed Overall Cognitive Status: Within Functional Limits for tasks assessed                                        General Comments      Exercises Other Exercises Other Exercises: Marching in place x 20   Assessment/Plan    PT Assessment Patient needs continued PT services  PT Problem List Decreased activity tolerance;Decreased balance;Decreased mobility;Decreased knowledge of use of DME;Decreased safety awareness;Decreased knowledge of precautions;Cardiopulmonary status limiting activity       PT Treatment Interventions DME instruction;Gait training;Functional mobility training;Therapeutic activities;Therapeutic exercise;Balance training;Patient/family education;Stair training    PT Goals (Current goals can be found in the Care Plan section)  Acute Rehab PT Goals Patient Stated Goal: to go home PT Goal Formulation: With patient Time For Goal Achievement: 05/02/20 Potential to Achieve Goals: Good    Frequency Min 3X/week   Barriers to discharge        Co-evaluation               AM-PAC PT "6 Clicks" Mobility  Outcome Measure Help needed turning from your back to your side while in a flat bed without using bedrails?: None Help needed moving from lying on your back to sitting on the side of a flat bed without using bedrails?: None Help needed moving to and from a bed to a chair (including a wheelchair)?: A Little Help needed standing up from a chair using your arms (e.g., wheelchair or bedside chair)?: A Little Help needed to walk in hospital room?: A Little Help needed climbing 3-5 steps with a railing? : A Little 6 Click Score: 20    End of Session Equipment Utilized During Treatment: Gait belt;Oxygen Activity Tolerance: Patient limited by fatigue Patient left: in bed;with call bell/phone within reach Nurse Communication:  Mobility status PT Visit Diagnosis: Muscle weakness (generalized) (M62.81)    Time: 1771-1657 PT Time Calculation (min) (ACUTE ONLY): 22 min   Charges:   PT Evaluation $PT Eval Moderate Complexity: 1 Mod          Kyung Muto W,PT Acute Rehabilitation Services Pager:  [phone]  Office:  [phone]    Denice Paradise 04/18/2020, 4:19 PM

## 2020-04-18 NOTE — Progress Notes (Signed)
ANTICOAGULATION CONSULT NOTE - Initial Consult  Pharmacy Consult for Coumadin Indication: h/o VTE and Lupus  Allergies  Allergen Reactions  . Penicillins Rash    Has patient had a PCN reaction causing immediate rash, facial/tongue/throat swelling, SOB or lightheadedness with hypotension: Yes Has patient had a PCN reaction causing severe rash involving mucus membranes or skin necrosis: No Has patient had a PCN reaction that required hospitalization No Has patient had a PCN reaction occurring within the last 10 years: No If all of the above answers are "NO", then may proceed with Cephalosporin use.   REACTION: rash  . Ciprofloxacin     nausea  . Ibuprofen Rash    swelling in leg     Patient Measurements: Height: 4\' 9"  (144.8 cm) Weight: 99.8 kg (220 lb) IBW/kg (Calculated) : 38.6  Vital Signs: Temp: 98.9 F (37.2 C) (08/12 1917) Temp Source: Oral (08/12 1917) BP: 136/81 (08/12 2344) Pulse Rate: 72 (08/13 0001)  Labs: Recent Labs    04/17/20 1917 04/17/20 2148  HGB 12.0  --   HCT 38.3  --   PLT 237  --   LABPROT 13.9  --   INR 1.1  --   CREATININE 0.97  --   TROPONINIHS 9 10    Estimated Creatinine Clearance: 66 mL/min (by C-G formula based on SCr of 0.97 mg/dL).   Medical History: Past Medical History:  Diagnosis Date  . Acute kidney failure with lesion of tubular necrosis (HCC)   . Acute systolic heart failure ([place])   . CAD (coronary artery disease)    a. s/p prior PCI. b. CABG 2007 at [facility] in [place] 2007. c. inferior STEMI 10/2015 s/p DES to dSVG-PDA.  . Cardiac arrest ([place])   . Cervical cancer ([place])   . Chronic diastolic CHF (congestive heart failure) ([place])   . Chronic respiratory failure ([place])    s/p tracheostomy 2002  . Chronic RUQ pain   . COPD (chronic obstructive pulmonary disease) ([place])   . Diabetes mellitus ([place])   . DVT (deep venous thrombosis) ([place])   . Endometriosis   . History of gallstones 01/2016   seen on Ultrasound   . History of HIDA scan 11/2016   normal  . HTN (hypertension)   . Hyperlipidemia   . Lupus anticoagulant disorder (HCC)    on coumadin  . Morbid obesity ([place])   . ST elevation (STEMI) myocardial infarction involving right coronary artery ([place]) 10/29/15   stent to VG to PDA  . Toe fracture, right 03/29/2018  . Tracheostomy in place [facility]), chronic since 2002 11/03/2015  . Tracheostomy status [facility])     Assessment: 54yo female admitted for acute on chronic CHF, to continue Coumadin for h/o VTE and Lupus; current INR below goal with last Coumadin dose taken 8/12 PTA.  Goal of Therapy:  INR 2-3   Plan:  Will give boosted Coumadin dose of 7.5mg  today (usual Fri dose is 5mg ) and monitor INR for dose adjustments.  Wynona Neat, PharmD, BCPS  04/18/2020,12:57 AM

## 2020-04-18 NOTE — Progress Notes (Signed)
Pt seen and admitted by Dr. Hal Hope earlier this morning.  54 year old lady prior history of coronary artery disease s/p CABG s/p PCI, chronic respiratory failure on trach collar, chronic systolic heart failure, DVT on Coumadin presents to ED with complaints of shortness of breath. She was admitted for acute systolic HF and was started on IV lasix for diuresis.    Pt seen and examined at bedside.   plan :   1. Continue with IV lasix 80 mg BID.  2. Will get cards input in am.    Hosie Poisson, MD

## 2020-04-18 NOTE — H&P (Signed)
History and Physical    Theresa [name] KTG:[id number] DOB: 1965-09-12 DOA: 04/17/2020  PCP: Monico Blitz, MD  Patient coming from: Home.  Chief Complaint: Shortness of breath.  HPI: Theresa [name] is a 54 y.o. female with history of CAD status post CABG status post stenting, chronic trach collar, chronic systolic heart failure last EF measured in 2020 was 40 to 45%, diabetes mellitus type 2, DVT on Coumadin presents to the ER with complaint of having increasing shortness of breath.  Patient states over the last 3 days patient has become progressively more short of breath than usual.  Increasing peripheral edema.  Shortness of breath increased on exertion.  No chest pain productive cough fever or chills.  Patient's primary care physician increased her torsemide dose by twice and patient took 1 dose of 120 mg of torsemide before coming to the ER.  Patient called the cardiology office and was instructed to come to the ER.  ED Course: In the ER patient was requiring about 5 L oxygen usually patient is on 4 L.  Chest x-ray shows congestion and labs show BNP of 550 high sensitive troponin was negative.  Covid test came back negative blood glucose is 215.  Patient was given 80 mg IV Lasix and admitted for further management of acute systolic heart failure.  Review of Systems: As per HPI, rest all negative.   Past Medical History:  Diagnosis Date   Acute kidney failure with lesion of tubular necrosis (HCC)    Acute systolic heart failure (HCC)    CAD (coronary artery disease)    a. s/p prior PCI. b. CABG 2007 at [facility] in [place] 2007. c. inferior STEMI 10/2015 s/p DES to dSVG-PDA.   Cardiac arrest [facility])    Cervical cancer (HCC)    Chronic diastolic CHF (congestive heart failure) (HCC)    Chronic respiratory failure (HCC)    s/p tracheostomy 2002   Chronic RUQ pain    COPD (chronic obstructive pulmonary disease) (HCC)    Diabetes mellitus ([place])    DVT (deep venous  thrombosis) ([place])    Endometriosis    History of gallstones 01/2016   seen on Ultrasound   History of HIDA scan 11/2016   normal   HTN (hypertension)    Hyperlipidemia    Lupus anticoagulant disorder (HCC)    on coumadin   Morbid obesity (HCC)    ST elevation (STEMI) myocardial infarction involving right coronary artery ([place]) 10/29/15   stent to VG to PDA   Toe fracture, right 03/29/2018   Tracheostomy in place [facility]), chronic since 2002 11/03/2015   Tracheostomy status [facility])     Past Surgical History:  Procedure Laterality Date   CARDIAC CATHETERIZATION N/A 10/29/2015   Procedure: Left Heart Cath and Cors/Grafts Angiography;  Surgeon: Burnell Blanks, MD;  Location: [place] [facility];  Service: Cardiovascular;  Laterality: N/A;   CARDIAC CATHETERIZATION  10/29/2015   Procedure: Coronary Stent Intervention;  Surgeon: Burnell Blanks, MD;  Location: [place] [facility];  Service: Cardiovascular;;   CAROTID STENT     CESAREAN SECTION WITH BILATERAL TUBAL LIGATION     CORONARY ARTERY BYPASS GRAFT  2007   2V   IR GASTROSTOMY TUBE MOD SED  11/13/2018   RIGHT/LEFT HEART CATH AND CORONARY/GRAFT ANGIOGRAPHY N/A 03/24/2018   Procedure: RIGHT/LEFT HEART CATH AND CORONARY/GRAFT ANGIOGRAPHY;  Surgeon: Belva Crome, MD;  Location: [place] [facility];  Service: Cardiovascular;  Laterality: N/A;   TRACHEOSTOMY  reports that she has been smoking cigarettes. She started smoking about 41 years ago. She has been smoking about 0.75 packs per day. She has never used smokeless tobacco. She reports that she does not drink alcohol and does not use drugs.  Allergies  Allergen Reactions   Penicillins Rash    Has patient had a PCN reaction causing immediate rash, facial/tongue/throat swelling, SOB or lightheadedness with hypotension: Yes Has patient had a PCN reaction causing severe rash involving mucus membranes or skin necrosis: No Has patient had a PCN reaction that  required hospitalization No Has patient had a PCN reaction occurring within the last 10 years: No If all of the above answers are "NO", then may proceed with Cephalosporin use.   REACTION: rash   Ciprofloxacin     nausea   Ibuprofen Rash    swelling in leg     Family History  Problem Relation Age of Onset   Hypertension Mother    Diabetes Mother    Hypertension Father    Diabetes Father     Prior to Admission medications   Medication Sig Start Date End Date Taking? Authorizing Provider  albuterol (PROVENTIL) (2.5 MG/3ML) 0.083% nebulizer solution Take 3 mLs (2.5 mg total) by nebulization 2 (two) times daily. Patient taking differently: Take 2.5 mg by nebulization every 4 (four) hours as needed for wheezing.  10/19/18  Yes Erick Colace, NP  albuterol (VENTOLIN HFA) 108 (90 Base) MCG/ACT inhaler Inhale 2 puffs into the lungs every 4 (four) hours as needed for wheezing or shortness of breath.   Yes [provider]  atorvastatin (LIPITOR) 20 MG tablet Take 1 tablet (20 mg total) by mouth daily. 01/19/19 04/17/20 Yes Goodrich, Callie E, PA-C  gabapentin (NEURONTIN) 300 MG capsule Take 1 capsule (300mg ) in the morning, 1 capsule (300mg ) in the afternoon, and 2 capsules (600mg ) at bedtime Patient taking differently: Take 900 mg by mouth at bedtime.  01/19/19  Yes Sarajane Jews, Callie E, PA-C  insulin degludec (TRESIBA FLEXTOUCH) 100 UNIT/ML FlexTouch Pen Inject 50 Units into the skin at bedtime.    Yes [provider]  LORazepam (ATIVAN) 0.5 MG tablet Take 0.5 mg by mouth every 6 (six) hours as needed for anxiety.    Yes [provider]  losartan (COZAAR) 25 MG tablet Take 1 tablet (25 mg total) by mouth daily. 01/20/19  Yes Sande Rives E, PA-C  metFORMIN (GLUCOPHAGE) 500 MG tablet Take 500 mg by mouth every morning. 11/09/19  Yes [provider]  metoprolol succinate (TOPROL-XL) 50 MG 24 hr tablet Take 1 tablet (50 mg total) by mouth daily. 07/27/19   Yes [name], MD  nitroGLYCERIN (NITROSTAT) 0.4 MG SL tablet Place 0.4 mg under the tongue every 5 (five) minutes as needed for chest pain.   Yes [provider]  NOVOLOG FLEXPEN 100 UNIT/ML FlexPen Inject 1-10 Units into the skin 3 (three) times daily before meals. 04/06/20  Yes [provider]  pantoprazole (PROTONIX) 20 MG tablet Take 20 mg by mouth daily. 02/16/20  Yes [provider]  potassium chloride SA (KLOR-CON) 20 MEQ tablet Take 20 mEq by mouth daily. 04/17/20  Yes [provider]  sertraline (ZOLOFT) 50 MG tablet Take 50 mg by mouth at bedtime.    Yes [provider]  tamsulosin (FLOMAX) 0.4 MG CAPS capsule Take 0.4 mg by mouth daily.   Yes [provider]  torsemide (DEMADEX) 20 MG tablet Take 3 tablets (60 mg total) by mouth daily. May  take additional 60 mg in the afternoon as needed for > 3 lbs weight gain Patient taking differently: Take 120 mg by mouth daily.  05/17/19  Yes Mercy Riding, MD  traMADol (ULTRAM) 50 MG tablet Take 50 mg by mouth 3 (three) times daily as needed. 03/29/20  Yes [provider]  warfarin (COUMADIN) 2.5 MG tablet Take 2 tablets (5 mg) tonight at 6 PM.  Go to your PCP for INR tomorrow. Patient taking differently: Take 2.5-5 mg by mouth every morning. Take 2 tablets (5 mg) tonight at 6 PM.  Go to your PCP for INR tomorrow. Mon Wed Fri and Sat 5 mg; all other days is 2.5 mg 05/17/19  Yes Gonfa, Taye T, MD  OZEMPIC, 0.25 OR 0.5 MG/DOSE, 2 MG/1.5ML SOPN Inject 0.5 mg into the skin every Wednesday.  04/08/19   [provider]    Physical Exam: Constitutional: Moderately built and nourished. Vitals:   04/17/20 2358 04/18/20 0001 04/18/20 0059 04/18/20 0135  BP:   131/79 129/79  Pulse: 74 72 74 74  Resp: 19 19 19 20   Temp:   98 F (36.7 C) 98.2 F (36.8 C)  TempSrc:   Oral Oral  SpO2: 93% 90% 90% 90%  Weight:      Height:       Eyes: Anicteric no pallor. ENMT: No discharge from  the ears eyes nose or mouth. Neck: No mass felt.  JVD elevated. Respiratory: No rhonchi or crepitations. Cardiovascular: S1-S2 heard. Abdomen: Soft nontender bowel sounds present. Musculoskeletal: Mild edema of the both lower extremities. Skin: Chronic skin rash on the lower extremities from psoriasis. Neurologic: Alert awake oriented to time place and person.  Moves all extremities. Psychiatric: Appears normal.  Normal affect.   Labs on Admission: I have personally reviewed following labs and imaging studies  CBC: Recent Labs  Lab 04/17/20 1917  WBC 8.7  HGB 12.0  HCT 38.3  MCV 98.0  PLT 259   Basic Metabolic Panel: Recent Labs  Lab 04/17/20 1917  NA 139  K 3.3*  CL 90*  CO2 34*  GLUCOSE 215*  BUN 14  CREATININE 0.97  CALCIUM 9.0   GFR: Estimated Creatinine Clearance: 66 mL/min (by C-G formula based on SCr of 0.97 mg/dL). Liver Function Tests: No results for input(s): AST, ALT, ALKPHOS, BILITOT, PROT, ALBUMIN in the last 168 hours. No results for input(s): LIPASE, AMYLASE in the last 168 hours. No results for input(s): AMMONIA in the last 168 hours. Coagulation Profile: Recent Labs  Lab 04/17/20 1917  INR 1.1   Cardiac Enzymes: No results for input(s): CKTOTAL, CKMB, CKMBINDEX, TROPONINI in the last 168 hours. BNP (last 3 results) No results for input(s): PROBNP in the last 8760 hours. HbA1C: No results for input(s): HGBA1C in the last 72 hours. CBG: No results for input(s): GLUCAP in the last 168 hours. Lipid Profile: No results for input(s): CHOL, HDL, LDLCALC, TRIG, CHOLHDL, LDLDIRECT in the last 72 hours. Thyroid Function Tests: No results for input(s): TSH, T4TOTAL, FREET4, T3FREE, THYROIDAB in the last 72 hours. Anemia Panel: No results for input(s): VITAMINB12, FOLATE, FERRITIN, TIBC, IRON, RETICCTPCT in the last 72 hours. Urine analysis:    Component Value Date/Time   COLORURINE AMBER (A) 05/08/2019 0405   APPEARANCEUR HAZY (A) 05/08/2019  0405   LABSPEC 1.040 (H) 05/08/2019 0405   PHURINE 5.0 05/08/2019 0405   GLUCOSEU 50 (A) 05/08/2019 0405   HGBUR NEGATIVE 05/08/2019 0405   BILIRUBINUR SMALL (A) 05/08/2019 0405  KETONESUR 5 (A) 05/08/2019 0405   PROTEINUR >=300 (A) 05/08/2019 0405   NITRITE NEGATIVE 05/08/2019 0405   LEUKOCYTESUR NEGATIVE 05/08/2019 0405   Sepsis Labs: @LABRCNTIP (procalcitonin:4,lacticidven:4) ) Recent Results (from the past 240 hour(s))  SARS Coronavirus 2 by RT PCR (hospital order, performed in [facility] hospital lab) Nasopharyngeal Nasopharyngeal Swab     Status: None   Collection Time: 04/17/20 10:10 PM   Specimen: Nasopharyngeal Swab  Result Value Ref Range Status   SARS Coronavirus 2 NEGATIVE NEGATIVE Final    Comment: (NOTE) SARS-CoV-2 target nucleic acids are NOT DETECTED.  The SARS-CoV-2 RNA is generally detectable in upper and lower respiratory specimens during the acute phase of infection. The lowest concentration of SARS-CoV-2 viral copies this assay can detect is 250 copies / mL. A negative result does not preclude SARS-CoV-2 infection and should not be used as the sole basis for treatment or other patient management decisions.  A negative result may occur with improper specimen collection / handling, submission of specimen other than nasopharyngeal swab, presence of viral mutation(s) within the areas targeted by this assay, and inadequate number of viral copies (<250 copies / mL). A negative result must be combined with clinical observations, patient history, and epidemiological information.  Fact Sheet for Patients:   StrictlyIdeas.no  Fact Sheet for Healthcare Providers: BankingDealers.co.za  This test is not yet approved or  cleared by the [place] FDA and has been authorized for detection and/or diagnosis of SARS-CoV-2 by FDA under an Emergency Use Authorization (EUA).  This EUA will remain in effect (meaning this  test can be used) for the duration of the COVID-19 declaration under Section 564(b)(1) of the Act, 21 U.S.C. section 360bbb-3(b)(1), unless the authorization is terminated or revoked sooner.  Performed at [facility], [place] [address], [place], [place] [address]      Radiological Exams on Admission: DG Chest 2 View  Result Date: 04/17/2020 CLINICAL DATA:  Dyspnea EXAM: CHEST - 2 VIEW COMPARISON:  01/15/2020 FINDINGS: Pulmonary insufflation remain stable since prior examination. Surgical staple lines related to partial right lung resection noted at the right lung base. There is perihilar and lower lung zone interstitial pulmonary infiltrate again identified, similar to prior examination, most in keeping with mild interstitial pulmonary edema, likely cardiogenic in nature. No pneumothorax or pleural effusion. Coronary artery bypass grafting has been performed. Mild to moderate cardiomegaly is stable. Tracheostomy is unchanged in position. Central pulmonary arterial enlargement is again noted, unchanged likely related to underlying pulmonary hypertension. No acute bone abnormality. IMPRESSION: Mild CHF, unchanged from prior exam Electronically Signed   By: Fidela Salisbury MD   On: 04/17/2020 19:52    EKG: Independently reviewed.  Normal sinus rhythm.  Assessment/Plan Principal Problem:   Acute on chronic congestive heart failure (HCC) Active Problems:   Essential hypertension   CAD, NATIVE VESSEL   Acute thromboembolism of deep veins of lower extremity (HCC)   Lupus anticoagulant syndrome (HCC)   Tracheostomy in place [facility]), chronic since 2002   Type 2 diabetes mellitus with hyperglycemia (HCC)   CHF (congestive heart failure) ([place])    1. Acute on chronic systolic heart failure last EF measured in 2020 was 40 to 45% for which I have placed patient on Lasix 80 mg IV every 12 and patient is already on ARB.  Closely follow intake output metabolic panel and daily weights.  Since patient  has significant hypoxia at this time we will also get a CT angiogram of the chest. 2.  History of CAD status post CABG and stenting denies any chest pain.  High sensitive troponins were negative.  Patient is on Coumadin beta-blockers and statins. 3. Diabetes mellitus type 2 with hyperglycemia has not taken her night dose of Tresiba.  Which have dose now. 4. History of DVT and lupus anticoagulant on Coumadin. 5. History of chronic respiratory failure on trach collar.  Given that patient has acute respiratory failure with hypoxia will need close monitoring for any further deterioration in inpatient status.   DVT prophylaxis: Coumadin. Code Status: Full code. Family Communication: Discussed with patient's husband. Disposition Plan: Home. Consults called: None. Admission status: Inpatient.   Rise Patience MD [facility] Pager [phone].  If 7PM-7AM, please contact night-coverage [email or web address] Password [facility]  04/18/2020, 1:53 AM

## 2020-04-18 NOTE — Progress Notes (Signed)
Pt BP 92/76. As per MD instruction, not given Lasix dose of 80mg .

## 2020-04-19 DIAGNOSIS — I5043 Acute on chronic combined systolic (congestive) and diastolic (congestive) heart failure: Secondary | ICD-10-CM

## 2020-04-19 DIAGNOSIS — D6862 Lupus anticoagulant syndrome: Secondary | ICD-10-CM

## 2020-04-19 LAB — GLUCOSE, CAPILLARY
Glucose-Capillary: 72 mg/dL (ref 70–99)
Glucose-Capillary: 160 mg/dL — ABNORMAL HIGH (ref 70–99)
Glucose-Capillary: 83 mg/dL (ref 70–99)
Glucose-Capillary: 227 mg/dL — ABNORMAL HIGH (ref 70–99)

## 2020-04-19 LAB — BASIC METABOLIC PANEL
Anion gap: 15 (ref 5–15)
BUN: 21 mg/dL — ABNORMAL HIGH (ref 6–20)
CO2: 33 mmol/L — ABNORMAL HIGH (ref 22–32)
Calcium: 8.8 mg/dL — ABNORMAL LOW (ref 8.9–10.3)
Chloride: 93 mmol/L — ABNORMAL LOW (ref 98–111)
Creatinine, Ser: 1.07 mg/dL — ABNORMAL HIGH (ref 0.44–1.00)
GFR calc Af Amer: >60 mL/min (ref >60)
GFR calc non Af Amer: 59 mL/min — ABNORMAL LOW (ref >60)
Glucose, Bld: 165 mg/dL — ABNORMAL HIGH (ref 70–99)
Potassium: 4.2 mmol/L (ref 3.5–5.1)
Sodium: 141 mmol/L (ref 135–145)

## 2020-04-19 LAB — PROTIME-INR
INR: 1.3 — ABNORMAL HIGH (ref 0.8–1.2)
Prothrombin Time: 15.4 seconds — ABNORMAL HIGH (ref 11.4–15.2)

## 2020-04-19 MED ORDER — WARFARIN SODIUM 7.5 MG PO TABS
7.5000 mg | ORAL_TABLET | Freq: Once | ORAL | Status: AC
  Administered 2020-04-19: 7.5 mg via ORAL
  Filled 2020-04-19: qty 1

## 2020-04-19 NOTE — Progress Notes (Signed)
PROGRESS NOTE    Theresa [name]  ZLD:[id number] DOB: 1966/02/25 DOA: 04/17/2020 PCP: Monico Blitz, MD    Chief Complaint  Patient presents with  . Shortness of Breath    Brief Narrative:   54 year old lady prior history of coronary artery disease s/p CABG s/p PCI, chronic respiratory failure on trach collar, chronic systolic heart failure, DVT on Coumadin presents to ED with complaints of shortness of breath and bilateral lower extremity edema. CT angio of the chest was obtained and is negative for pulmonary embolism. Marland Kitchen She was admitted for acute systolic HF and was started on IV lasix for diuresis. Her BP parameters are borderline, she might need inotrops, will request cardiology assistance for further evaluation.    Assessment & Plan:   Principal Problem:   Acute on chronic congestive heart failure (HCC) Active Problems:   Essential hypertension   CAD, NATIVE VESSEL   Acute thromboembolism of deep veins of lower extremity (HCC)   Lupus anticoagulant syndrome (HCC)   Tracheostomy in place [facility]), chronic since 2002   Type 2 diabetes mellitus with hyperglycemia (HCC)   CHF (congestive heart failure) (HCC)   Acute on chronic combined systolic and diastolic heart failure. Superimposed on history of ischemic cardiomyopathy. Echocardiogram in May 2020 shows left ventricular ejection fraction of 40 to 45%. Patient was started on IV Lasix 80 mg twice daily.  Cardiology consulted and recommended repeating echocardiogram this admission.   Coronary artery disease s/p CABG Currently patient denies any chest pain and troponins are negative. Continue with statin.  Chronic respiratory failure s/p trach Continue with oxygen.   Essential hypertension Blood pressure parameters are borderline.    Type 2 diabetes mellitus: CBG (last 3)  Recent Labs    04/18/20 2052 04/19/20 0932 04/19/20 1236  GLUCAP 252* 72 160*   Continue with sliding scale insulin.   History of lupus  anticoagulant, DVT Continue with Coumadin and Lovenox.    DVT prophylaxis:  Code Status: FULL CODE.  Family Communication: none at bedside.  Disposition:   Status is: Inpatient  Remains inpatient appropriate because:IV treatments appropriate due to intensity of illness or inability to take PO   Dispo: The patient is from: Home              Anticipated d/c is to: pending.               Anticipated d/c date is: 2 days              Patient currently is not medically stable to d/c.       Consultants:   Cardiology.    Procedures: echocardiogram.    Antimicrobials: none.    Subjective: No chest pain,sob improved.   Objective: Vitals:   04/19/20 0923 04/19/20 0933 04/19/20 1125 04/19/20 1200  BP:  98/77  94/65  Pulse: 72 74    Resp: 18  16 16   Temp:    98.1 F (36.7 C)  TempSrc:    Skin  SpO2: 100% 94%    Weight:      Height:        Intake/Output Summary (Last 24 hours) at 04/19/2020 1503 Last data filed at 04/19/2020 1446 Gross per 24 hour  Intake 540 ml  Output 1086 ml  Net -546 ml   Filed Weights   04/17/20 2051 04/18/20 0600 04/19/20 0405  Weight: 99.8 kg 99.5 kg 104 kg    Examination:  General exam: Appears calm and comfortable on trach collar.  Respiratory system: diminished  air entry at bases, some tachypnea present, no wheezing or rhonchi.  Cardiovascular system: S1 & S2 heard, RRR. No JVD, Gastrointestinal system: Abdomen is nondistended, soft and nontender. Normal bowel sounds heard. Central nervous system: Alert and oriented. Grossly non focal.  Extremities: pedal edema present.  Skin: No rashes,  Psychiatry:  Mood & affect appropriate.     Data Reviewed: I have personally reviewed following labs and imaging studies  CBC: Recent Labs  Lab 04/17/20 1917 04/18/20 0510  WBC 8.7 8.4  HGB 12.0 12.1  HCT 38.3 37.7  MCV 98.0 95.9  PLT 237 681    Basic Metabolic Panel: Recent Labs  Lab 04/17/20 1917 04/18/20 0510 04/19/20 1042    NA 139 135 141  K 3.3* 3.9 4.2  CL 90* 90* 93*  CO2 34* 26 33*  GLUCOSE 215* 363* 165*  BUN 14 18 21*  CREATININE 0.97 0.97 1.07*  CALCIUM 9.0 8.5* 8.8*    GFR: Estimated Creatinine Clearance: 61.5 mL/min (A) (by C-G formula based on SCr of 1.07 mg/dL (H)).  Liver Function Tests: No results for input(s): AST, ALT, ALKPHOS, BILITOT, PROT, ALBUMIN in the last 168 hours.  CBG: Recent Labs  Lab 04/18/20 1359 04/18/20 1717 04/18/20 2052 04/19/20 0932 04/19/20 1236  GLUCAP 152* 224* 252* 72 160*     Recent Results (from the past 240 hour(s))  SARS Coronavirus 2 by RT PCR (hospital order, performed in [facility] hospital lab) Nasopharyngeal Nasopharyngeal Swab     Status: None   Collection Time: 04/17/20 10:10 PM   Specimen: Nasopharyngeal Swab  Result Value Ref Range Status   SARS Coronavirus 2 NEGATIVE NEGATIVE Final    Comment: (NOTE) SARS-CoV-2 target nucleic acids are NOT DETECTED.  The SARS-CoV-2 RNA is generally detectable in upper and lower respiratory specimens during the acute phase of infection. The lowest concentration of SARS-CoV-2 viral copies this assay can detect is 250 copies / mL. A negative result does not preclude SARS-CoV-2 infection and should not be used as the sole basis for treatment or other patient management decisions.  A negative result may occur with improper specimen collection / handling, submission of specimen other than nasopharyngeal swab, presence of viral mutation(s) within the areas targeted by this assay, and inadequate number of viral copies (<250 copies / mL). A negative result must be combined with clinical observations, patient history, and epidemiological information.  Fact Sheet for Patients:   StrictlyIdeas.no  Fact Sheet for Healthcare Providers: BankingDealers.co.za  This test is not yet approved or  cleared by the [place] FDA and has been authorized for detection  and/or diagnosis of SARS-CoV-2 by FDA under an Emergency Use Authorization (EUA).  This EUA will remain in effect (meaning this test can be used) for the duration of the COVID-19 declaration under Section 564(b)(1) of the Act, 21 U.S.C. section 360bbb-3(b)(1), unless the authorization is terminated or revoked sooner.  Performed at [facility], [place] [address] [place], [place] [address]          Radiology Studies: DG Chest 2 View  Result Date: 04/17/2020 CLINICAL DATA:  Dyspnea EXAM: CHEST - 2 VIEW COMPARISON:  01/15/2020 FINDINGS: Pulmonary insufflation remain stable since prior examination. Surgical staple lines related to partial right lung resection noted at the right lung base. There is perihilar and lower lung zone interstitial pulmonary infiltrate again identified, similar to prior examination, most in keeping with mild interstitial pulmonary edema, likely cardiogenic in nature. No pneumothorax or pleural effusion. Coronary artery bypass  grafting has been performed. Mild to moderate cardiomegaly is stable. Tracheostomy is unchanged in position. Central pulmonary arterial enlargement is again noted, unchanged likely related to underlying pulmonary hypertension. No acute bone abnormality. IMPRESSION: Mild CHF, unchanged from prior exam Electronically Signed   By: Fidela Salisbury MD   On: 04/17/2020 19:52   CT ANGIO CHEST PE W OR WO CONTRAST  Result Date: 04/18/2020 CLINICAL DATA:  Shortness of breath EXAM: CT ANGIOGRAPHY CHEST WITH CONTRAST TECHNIQUE: Multidetector CT imaging of the chest was performed using the standard protocol during bolus administration of intravenous contrast. Multiplanar CT image reconstructions and MIPs were obtained to evaluate the vascular anatomy. CONTRAST:  62mL OMNIPAQUE IOHEXOL 350 MG/ML SOLN COMPARISON:  Chest x-ray from the previous day. FINDINGS: Cardiovascular: Atherosclerotic calcifications of the thoracic aorta are noted. Changes of coronary bypass  grafting are noted and stable. The heart is mildly enlarged. No pericardial effusion is seen. Coronary calcifications are noted. The pulmonary artery is well visualized with a normal branching pattern. No focal filling defect to suggest pulmonary embolism is seen. Mediastinum/Nodes: Thoracic inlet is within normal limits with the exception of a tracheostomy tube in satisfactory position. Scattered hilar and mediastinal lymph nodes are noted likely reactive in nature. The esophagus as visualized is within normal limits. Lungs/Pleura: Ground-glass opacities are noted throughout the lungs bilaterally likely related to the known COVID-19 infection. Additionally a vascular congestion and edema is noted in the superimposed fashion. Postsurgical changes in the right lung base are noted and stable. Upper Abdomen: Visualized upper abdomen shows a stable left adrenal lesion consistent with an adenoma similar to that seen on a prior CT from 2020. Cholelithiasis is noted and stable. Musculoskeletal: Degenerative changes of the thoracic spine are noted. No acute bony abnormality is seen. Stable compression deformity is seen at T9 Review of the MIP images confirms the above findings. IMPRESSION: No evidence of pulmonary emboli. Diffuse edema is identified consistent with a degree of congestive failure. Additionally ground-glass opacities are noted likely related to sequelae from prior COVID-19 infection Stable left adrenal lesion consistent with adenoma. Cholelithiasis without complicating factors. Electronically Signed   By: Inez Catalina M.D.   On: 04/18/2020 09:39        Scheduled Meds: . atorvastatin  20 mg Oral Daily  . chlorhexidine  15 mL Mouth Rinse BID  . enoxaparin (LOVENOX) injection  40 mg Subcutaneous Q24H  . furosemide  80 mg Intravenous Q12H  . gabapentin  900 mg Oral QHS  . insulin aspart  0-9 Units Subcutaneous TID WC  . insulin glargine  50 Units Subcutaneous QHS  . losartan  25 mg Oral Daily  .  mouth rinse  15 mL Mouth Rinse q12n4p  . metoprolol succinate  50 mg Oral Daily  . pantoprazole  20 mg Oral Daily  . potassium chloride SA  20 mEq Oral Daily  . sertraline  50 mg Oral QHS  . tamsulosin  0.4 mg Oral Daily  . warfarin  7.5 mg Oral ONCE-1600  . Warfarin - Pharmacist Dosing Inpatient   Does not apply q1600   Continuous Infusions:   LOS: 2 days       Hosie Poisson, MD [facility]   To contact the attending provider between 7A-7P or the covering provider during after hours 7P-7A, please log into the web site [email or web address] and access using universal [place] password for that web site. If you do not have the password, please call the hospital operator.  04/19/2020, 3:03 PM

## 2020-04-19 NOTE — Consult Note (Addendum)
Cardiology Consultation:   Patient ID: Theresa [name] MRN: [id number]; DOB: [birth year]  Admit date: 04/17/2020 Date of Consult: 04/19/2020  Primary Care Provider: Monico Blitz, MD [facility] Cardiologist: [name], MD  [facility] Electrophysiologist:  None    Patient Profile:   Theresa [name] is a 54 y.o. female with the following hx who is being seen today for the evaluation of a/c combined systolic and diastolic congestive heart failure at the request of Dr. Hosie Poisson.  Coronary artery disease   S/p prior PCI [facility]) and eventual CABG (ECU - [facility] in 2007)  S/p Inf STEMI in 2/17 >> PCI: DES to S-PDA  Cath 03/2018: Occluded LAD, RCA; LCx stent patent; S-RCA 100, L-LAD patent; L-R collats, EF 35-45, mean PA 40  Combined systolic and diastolic CHF  Echo 03/6225:  Inf-Lat HK, EF 40-45, Gr 2 DD, mild reduced RVSF, RVSP 58, mild to mod MR, BAE  Diabetes mellitus   Hypertension   Hyperlipidemia  COPD  Chronic respiratory failure, s/p trach in 2002  DVT (Lupus anticoagulant); chronic anticoagulation   Pulmonary hypertension   Hx of PEA arrest (dislodged tracheostomy) in 10/2018  Morbid obesity    History of Present Illness:   Theresa [name] was last seen by Dr. Percival Spanish in 07/2019.  She weight 194 lbs at that visit.  She was admitted 04/17/2020 by the hospitalist service due to worsening shortness of breath and leg swelling despite escalating doses of diuretics at home.  Due to worsening hypoxia (on 5L >> O2 90%) a chest CTA was obtained and this was neg for pulmonary embolism.  Her admit weight was 230 lbs (but accuracy is questionable >> several weights - 230, 220, 219, 229 since admit).  She has been started on IV Furosemide and Cardiology is asked to further evaluate.    The patient notes worsening shortness of breath and leg swelling over the past couple of months.  She has noticed that her diuretic is not working as well.  She was just told to  increase her Torsemide the day prior to admit.  She notes orthopnea, paroxysmal nocturnal dyspnea.  She has not had chest pain, syncope.  Of note, her IV Furosemide was held last night and this AM due to low BP.    Data: K+ 3.3>>3.9, SCr 0.97; Hgb 12.1, INR 1.3, TSH 1.272 BNP 550.3 Hs-Trop 9>> 10 SARS-CoV-2 neg Chest CT: No PE; +CHF; GGO likely sequelae of prior COVID-19 infection CXR: mild CHF EKG: NSR, HR 77, normal axis, Lat TW flattening, QTc 454, no change from prior tracing   Past Medical History:  Diagnosis Date  . Acute kidney failure with lesion of tubular necrosis (HCC)   . Acute systolic heart failure ([place])   . CAD (coronary artery disease)    a. s/p prior PCI. b. CABG 2007 at [facility] in [place] 2007. c. inferior STEMI 10/2015 s/p DES to dSVG-PDA.  . Cardiac arrest ([place])   . Cervical cancer ([place])   . Chronic diastolic CHF (congestive heart failure) ([place])   . Chronic respiratory failure ([place])    s/p tracheostomy 2002  . Chronic RUQ pain   . COPD (chronic obstructive pulmonary disease) ([place])   . Diabetes mellitus ([place])   . DVT (deep venous thrombosis) ([place])   . Endometriosis   . History of gallstones 01/2016   seen on Ultrasound  . History of HIDA scan 11/2016   normal  . HTN (hypertension)   . Hyperlipidemia   .  Lupus anticoagulant disorder (HCC)    on coumadin  . Morbid obesity ([place])   . ST elevation (STEMI) myocardial infarction involving right coronary artery ([place]) 10/29/15   stent to VG to PDA  . Toe fracture, right 03/29/2018  . Tracheostomy in place [facility]), chronic since 2002 11/03/2015  . Tracheostomy status [facility])     Past Surgical History:  Procedure Laterality Date  . CARDIAC CATHETERIZATION N/A 10/29/2015   Procedure: Left Heart Cath and Cors/Grafts Angiography;  Surgeon: Burnell Blanks, MD;  Location: [place] CV LAB;  Service: Cardiovascular;  Laterality: N/A;  . CARDIAC CATHETERIZATION  10/29/2015   Procedure: Coronary Stent  Intervention;  Surgeon: Burnell Blanks, MD;  Location: [place] CV LAB;  Service: Cardiovascular;;  . CAROTID STENT    . CESAREAN SECTION WITH BILATERAL TUBAL LIGATION    . CORONARY ARTERY BYPASS GRAFT  2007   2V  . IR GASTROSTOMY TUBE MOD SED  11/13/2018  . RIGHT/LEFT HEART CATH AND CORONARY/GRAFT ANGIOGRAPHY N/A 03/24/2018   Procedure: RIGHT/LEFT HEART CATH AND CORONARY/GRAFT ANGIOGRAPHY;  Surgeon: Belva Crome, MD;  Location: [place] CV LAB;  Service: Cardiovascular;  Laterality: N/A;  . TRACHEOSTOMY       Home Medications:  Prior to Admission medications   Medication Sig Start Date End Date Taking? Authorizing Provider  albuterol (PROVENTIL) (2.5 MG/3ML) 0.083% nebulizer solution Take 3 mLs (2.5 mg total) by nebulization 2 (two) times daily. Patient taking differently: Take 2.5 mg by nebulization every 4 (four) hours as needed for wheezing.  10/19/18  Yes Erick Colace, NP  albuterol (VENTOLIN HFA) 108 (90 Base) MCG/ACT inhaler Inhale 2 puffs into the lungs every 4 (four) hours as needed for wheezing or shortness of breath.   Yes [provider]  atorvastatin (LIPITOR) 20 MG tablet Take 1 tablet (20 mg total) by mouth daily. 01/19/19 04/17/20 Yes Goodrich, Callie E, PA-C  gabapentin (NEURONTIN) 300 MG capsule Take 1 capsule (300mg ) in the morning, 1 capsule (300mg ) in the afternoon, and 2 capsules (600mg ) at bedtime Patient taking differently: Take 900 mg by mouth at bedtime.  01/19/19  Yes Sarajane Jews, Callie E, PA-C  insulin degludec (TRESIBA FLEXTOUCH) 100 UNIT/ML FlexTouch Pen Inject 50 Units into the skin at bedtime.    Yes [provider]  LORazepam (ATIVAN) 0.5 MG tablet Take 0.5 mg by mouth every 6 (six) hours as needed for anxiety.    Yes [provider]  losartan (COZAAR) 25 MG tablet Take 1 tablet (25 mg total) by mouth daily. 01/20/19  Yes Sande Rives E, PA-C  metFORMIN (GLUCOPHAGE) 500 MG tablet Take 500 mg by mouth every morning.  11/09/19  Yes [provider]  metoprolol succinate (TOPROL-XL) 50 MG 24 hr tablet Take 1 tablet (50 mg total) by mouth daily. 07/27/19  Yes [name], MD  nitroGLYCERIN (NITROSTAT) 0.4 MG SL tablet Place 0.4 mg under the tongue every 5 (five) minutes as needed for chest pain.   Yes [provider]  NOVOLOG FLEXPEN 100 UNIT/ML FlexPen Inject 1-10 Units into the skin 3 (three) times daily before meals. 04/06/20  Yes [provider]  pantoprazole (PROTONIX) 20 MG tablet Take 20 mg by mouth daily. 02/16/20  Yes [provider]  potassium chloride SA (KLOR-CON) 20 MEQ tablet Take 20 mEq by mouth daily. 04/17/20  Yes [provider]  sertraline (ZOLOFT) 50 MG tablet Take 50 mg by mouth at bedtime.    Yes [provider]  tamsulosin (FLOMAX) 0.4  MG CAPS capsule Take 0.4 mg by mouth daily.   Yes [provider]  torsemide (DEMADEX) 20 MG tablet Take 3 tablets (60 mg total) by mouth daily. May take additional 60 mg in the afternoon as needed for > 3 lbs weight gain Patient taking differently: Take 120 mg by mouth daily.  05/17/19  Yes Mercy Riding, MD  traMADol (ULTRAM) 50 MG tablet Take 50 mg by mouth 3 (three) times daily as needed. 03/29/20  Yes [provider]  warfarin (COUMADIN) 2.5 MG tablet Take 2 tablets (5 mg) tonight at 6 PM.  Go to your PCP for INR tomorrow. Patient taking differently: Take 2.5-5 mg by mouth every morning. Take 2 tablets (5 mg) tonight at 6 PM.  Go to your PCP for INR tomorrow. Mon Wed Fri and Sat 5 mg; all other days is 2.5 mg 05/17/19  Yes Gonfa, Taye T, MD  OZEMPIC, 0.25 OR 0.5 MG/DOSE, 2 MG/1.5ML SOPN Inject 0.5 mg into the skin every Wednesday.  04/08/19   [provider]    Inpatient Medications: Scheduled Meds: . atorvastatin  20 mg Oral Daily  . chlorhexidine  15 mL Mouth Rinse BID  . enoxaparin (LOVENOX) injection  40 mg Subcutaneous Q24H  . furosemide  80 mg Intravenous Q12H  .  gabapentin  900 mg Oral QHS  . insulin aspart  0-9 Units Subcutaneous TID WC  . insulin glargine  50 Units Subcutaneous QHS  . losartan  25 mg Oral Daily  . mouth rinse  15 mL Mouth Rinse q12n4p  . metoprolol succinate  50 mg Oral Daily  . pantoprazole  20 mg Oral Daily  . potassium chloride SA  20 mEq Oral Daily  . sertraline  50 mg Oral QHS  . tamsulosin  0.4 mg Oral Daily  . warfarin  7.5 mg Oral ONCE-1600  . Warfarin - Pharmacist Dosing Inpatient   Does not apply q1600   Continuous Infusions:  PRN Meds: albuterol, LORazepam, nitroGLYCERIN, ondansetron **OR** ondansetron (ZOFRAN) IV, traMADol  Allergies:    Allergies  Allergen Reactions  . Penicillins Rash    Has patient had a PCN reaction causing immediate rash, facial/tongue/throat swelling, SOB or lightheadedness with hypotension: Yes Has patient had a PCN reaction causing severe rash involving mucus membranes or skin necrosis: No Has patient had a PCN reaction that required hospitalization No Has patient had a PCN reaction occurring within the last 10 years: No If all of the above answers are "NO", then may proceed with Cephalosporin use.   REACTION: rash  . Ciprofloxacin     nausea  . Ibuprofen Rash    swelling in leg     Social History:   Social History   Socioeconomic History  . Marital status: Married    Spouse name: Not on file  . Number of children: Not on file  . Years of education: Not on file  . Highest education level: Not on file  Occupational History  . Not on file  Tobacco Use  . Smoking status: Current Every Day Smoker    Packs/day: 0.75    Types: Cigarettes    Start date: 10/23/1978  . Smokeless tobacco: Never Used  . Tobacco comment: 1/2 pack - trying to cut back   Vaping Use  . Vaping Use: Never used  Substance and Sexual Activity  . Alcohol use: No    Alcohol/week: 0.0 standard drinks  . Drug use: No  . Sexual activity: Not on file  Other Topics  Concern  . Not on file  Social  History Narrative  . Not on file   Social Determinants of Health   Financial Resource Strain:   . Difficulty of Paying Living Expenses:   Food Insecurity:   . Worried About Charity fundraiser in the Last Year:   . Arboriculturist in the Last Year:   Transportation Needs:   . Film/video editor (Medical):   Marland Kitchen Lack of Transportation (Non-Medical):   Physical Activity:   . Days of Exercise per Week:   . Minutes of Exercise per Session:   Stress:   . Feeling of Stress :   Social Connections:   . Frequency of Communication with Friends and Family:   . Frequency of Social Gatherings with Friends and Family:   . Attends Religious Services:   . Active Member of Clubs or Organizations:   . Attends Archivist Meetings:   Marland Kitchen Marital Status:   Intimate Partner Violence:   . Fear of Current or Ex-Partner:   . Emotionally Abused:   Marland Kitchen Physically Abused:   . Sexually Abused:     Family History:    Family History  Problem Relation Age of Onset  . Hypertension Mother   . Diabetes Mother   . Hypertension Father   . Diabetes Father      ROS:  Please see the history of present illness.  No fever, vomiting, diarrhea, melena, hematochezia. She has been coughing and wheezing. All other ROS reviewed and negative.     Physical Exam/Data:   Vitals:   04/19/20 0534 04/19/20 0800 04/19/20 0923 04/19/20 0933  BP: 91/74 101/71  98/77  Pulse: 66  72 74  Resp:  18 18   Temp:  98.7 F (37.1 C)    TempSrc:  Skin    SpO2: 99%  100% 94%  Weight:      Height:        Intake/Output Summary (Last 24 hours) at 04/19/2020 1045 Last data filed at 04/19/2020 1036 Gross per 24 hour  Intake 300 ml  Output 1085 ml  Net -785 ml   Last 3 Weights 04/19/2020 04/18/2020 04/17/2020  Weight (lbs) 229 lb 4.5 oz 219 lb 6.4 oz 220 lb  Weight (kg) 104 kg 99.519 kg 99.791 kg     Body mass index is 49.62 kg/m.  General:  Well nourished, well developed, in no acute distress  HEENT:  normal Lymph: no adenopathy Neck: +JVD Endocrine:  No thryomegaly Cardiac:  Distant heart sounds; RRR; no murmur   Lungs:  rhoncorous breath sounds bilat Abd: soft, nontender   Ext: tr-1+ bilat LE edema Musculoskeletal:  No deformities  Skin: psoriatic plaques noted bilat LE   Neuro:  CNs 2-12 intact, no focal abnormalities noted Psych:  Normal affect   EKG:  The EKG was personally reviewed and demonstrates:  See above  Telemetry:  Telemetry was personally reviewed and demonstrates:  SVT HR 170s    Laboratory Data:  High Sensitivity Troponin:   Recent Labs  Lab 04/17/20 1917 04/17/20 2148  TROPONINIHS 9 10     Chemistry Recent Labs  Lab 04/17/20 1917 04/18/20 0510  NA 139 135  K 3.3* 3.9  CL 90* 90*  CO2 34* 26  GLUCOSE 215* 363*  BUN 14 18  CREATININE 0.97 0.97  CALCIUM 9.0 8.5*  GFRNONAA >60 >60  GFRAA >60 >60  ANIONGAP 15 19*    No results for input(s): PROT, ALBUMIN, AST, ALT, ALKPHOS, BILITOT  in the last 168 hours. Hematology Recent Labs  Lab 04/17/20 1917 04/18/20 0510  WBC 8.7 8.4  RBC 3.91 3.93  HGB 12.0 12.1  HCT 38.3 37.7  MCV 98.0 95.9  MCH 30.7 30.8  MCHC 31.3 32.1  RDW 13.6 13.5  PLT 237 209   BNP Recent Labs  Lab 04/17/20 1923  BNP 550.3*    DDimer  Recent Labs  Lab 04/18/20 0510  DDIMER 0.62*     Radiology/Studies:  DG Chest 2 View  Result Date: 04/17/2020 CLINICAL DATA:  Dyspnea EXAM: CHEST - 2 VIEW COMPARISON:  01/15/2020 FINDINGS: Pulmonary insufflation remain stable since prior examination. Surgical staple lines related to partial right lung resection noted at the right lung base. There is perihilar and lower lung zone interstitial pulmonary infiltrate again identified, similar to prior examination, most in keeping with mild interstitial pulmonary edema, likely cardiogenic in nature. No pneumothorax or pleural effusion. Coronary artery bypass grafting has been performed. Mild to moderate cardiomegaly is stable.  Tracheostomy is unchanged in position. Central pulmonary arterial enlargement is again noted, unchanged likely related to underlying pulmonary hypertension. No acute bone abnormality. IMPRESSION: Mild CHF, unchanged from prior exam Electronically Signed   By: Fidela Salisbury MD   On: 04/17/2020 19:52   CT ANGIO CHEST PE W OR WO CONTRAST  Result Date: 04/18/2020 CLINICAL DATA:  Shortness of breath EXAM: CT ANGIOGRAPHY CHEST WITH CONTRAST TECHNIQUE: Multidetector CT imaging of the chest was performed using the standard protocol during bolus administration of intravenous contrast. Multiplanar CT image reconstructions and MIPs were obtained to evaluate the vascular anatomy. CONTRAST:  37mL OMNIPAQUE IOHEXOL 350 MG/ML SOLN COMPARISON:  Chest x-ray from the previous day. FINDINGS: Cardiovascular: Atherosclerotic calcifications of the thoracic aorta are noted. Changes of coronary bypass grafting are noted and stable. The heart is mildly enlarged. No pericardial effusion is seen. Coronary calcifications are noted. The pulmonary artery is well visualized with a normal branching pattern. No focal filling defect to suggest pulmonary embolism is seen. Mediastinum/Nodes: Thoracic inlet is within normal limits with the exception of a tracheostomy tube in satisfactory position. Scattered hilar and mediastinal lymph nodes are noted likely reactive in nature. The esophagus as visualized is within normal limits. Lungs/Pleura: Ground-glass opacities are noted throughout the lungs bilaterally likely related to the known COVID-19 infection. Additionally a vascular congestion and edema is noted in the superimposed fashion. Postsurgical changes in the right lung base are noted and stable. Upper Abdomen: Visualized upper abdomen shows a stable left adrenal lesion consistent with an adenoma similar to that seen on a prior CT from 2020. Cholelithiasis is noted and stable. Musculoskeletal: Degenerative changes of the thoracic spine are  noted. No acute bony abnormality is seen. Stable compression deformity is seen at T9 Review of the MIP images confirms the above findings. IMPRESSION: No evidence of pulmonary emboli. Diffuse edema is identified consistent with a degree of congestive failure. Additionally ground-glass opacities are noted likely related to sequelae from prior COVID-19 infection Stable left adrenal lesion consistent with adenoma. Cholelithiasis without complicating factors. Electronically Signed   By: Inez Catalina M.D.   On: 04/18/2020 09:39         New York Heart Association (NYHA) Functional Class NYHA Class IV  Assessment and Plan:   1. Acute on chronic combined systolic and diastolic CHF EF 54-00 by echocardiogram in 01/2019.  Ischemic CM.  NYHA 3-4.  She does admit to eating fast food but eats most meals at home.  Suspect her diet  is rich in salt.  Would not hold Furosemide for low BP.  Ok to hold ARB or beta-blocker if low BP.  Last echocardiogram done in 2020.   We will follow with you.   2. Coronary artery disease s/p CABG Hx of prior PCI and eventual CABG.  S/p inf STEMI in 2017 tx with DES to S-PDA.  Cath in 2019 with occluded S-PDA, patent LCx stent and patent LIMA. She is not having anginal symptoms.  Troponins are neg.  She is not on ASA as she is on Warfarin.  Continue statin.   3. Chronic respiratory failure S/p Trach.  O2 sats are dropping into the 80s with PT.  Continue O2.    4. Hypercoagulable state INR subtherapeutic on admit.  On DVT dose enoxaparin.  CT neg for PE.    5. SVT Brief episode yesterday.  She does not palpitations from time to time at home.  She may ultimately need to see EP as an OP if rhythm continues.     For questions or updates, please contact [place] Please consult [email or web address] for contact info under    Signed, Richardson Dopp, PA-C  04/19/2020 10:45 AM   History and all data above reviewed.  Patient examined.  I agree with the findings as above.  The patient  presents with acute on chronic systolic and diastolic HF.  She reports that she has had slowly increased dyspnea and weights that have crept up.  She does not use salt but does have many prepared meals.   She says that her APP was "maxing out" her diuretic dose without response.   She did not report chest pain.  She just progressively had increased dyspnea and PND with leg and abdominal edema.  She has had low BPs in the hospital and diuretics have been   The patient exam reveals COR:RRR  ,  Lungs: Decreased breath sounds  ,  Abd: Positive bowel sounds, no rebound no guarding, Ext Mild/mod edema  .  All available labs, radiology testing, previous records reviewed. Agree with documented assessment and plan.   Acute on chronic HF:  I would repeat an echo.  We will go ahead and give her Lasix even with the low BP.  I am not suggesting other invasive evaluation at this time.  We will follow and see if she needs other therapy this admission such as inotropes although I am not suspecting that she will need this.    Jeneen Rinks Andrej Spagnoli  12:43 PM  04/19/2020

## 2020-04-19 NOTE — Progress Notes (Addendum)
ANTICOAGULATION CONSULT NOTE - Initial Consult  Pharmacy Consult for Coumadin Indication: h/o VTE and Lupus  Allergies  Allergen Reactions  . Penicillins Rash    Has patient had a PCN reaction causing immediate rash, facial/tongue/throat swelling, SOB or lightheadedness with hypotension: Yes Has patient had a PCN reaction causing severe rash involving mucus membranes or skin necrosis: No Has patient had a PCN reaction that required hospitalization No Has patient had a PCN reaction occurring within the last 10 years: No If all of the above answers are "NO", then may proceed with Cephalosporin use.   REACTION: rash  . Ciprofloxacin     nausea  . Ibuprofen Rash    swelling in leg     Patient Measurements: Height: 4\' 9"  (144.8 cm) Weight: 104 kg (229 lb 4.5 oz) IBW/kg (Calculated) : 38.6  Vital Signs: Temp: 98.7 F (37.1 C) (08/14 0800) Temp Source: Skin (08/14 0800) BP: 101/71 (08/14 0800) Pulse Rate: 66 (08/14 0534)  Labs: Recent Labs    04/17/20 1917 04/17/20 2148 04/18/20 0400 04/18/20 0510 04/19/20 0339  HGB 12.0  --   --  12.1  --   HCT 38.3  --   --  37.7  --   PLT 237  --   --  209  --   LABPROT 13.9  --  14.1  --  15.4*  INR 1.1  --  1.1  --  1.3*  CREATININE 0.97  --   --  0.97  --   TROPONINIHS 9 10  --   --   --     Estimated Creatinine Clearance: 67.8 mL/min (by C-G formula based on SCr of 0.97 mg/dL).   Medical History: Past Medical History:  Diagnosis Date  . Acute kidney failure with lesion of tubular necrosis (HCC)   . Acute systolic heart failure ([place])   . CAD (coronary artery disease)    a. s/p prior PCI. b. CABG 2007 at [facility] in [place] 2007. c. inferior STEMI 10/2015 s/p DES to dSVG-PDA.  . Cardiac arrest ([place])   . Cervical cancer ([place])   . Chronic diastolic CHF (congestive heart failure) ([place])   . Chronic respiratory failure ([place])    s/p tracheostomy 2002  . Chronic RUQ pain   . COPD (chronic obstructive pulmonary  disease) ([place])   . Diabetes mellitus ([place])   . DVT (deep venous thrombosis) ([place])   . Endometriosis   . History of gallstones 01/2016   seen on Ultrasound  . History of HIDA scan 11/2016   normal  . HTN (hypertension)   . Hyperlipidemia   . Lupus anticoagulant disorder (HCC)    on coumadin  . Morbid obesity ([place])   . ST elevation (STEMI) myocardial infarction involving right coronary artery ([place]) 10/29/15   stent to VG to PDA  . Toe fracture, right 03/29/2018  . Tracheostomy in place [facility]), chronic since 2002 11/03/2015  . Tracheostomy status [facility])     Assessment: 54 yo female admitted for acute on chronic CHF, to continue PTA warfarin for a history of VTE and lupus. PTA last dose of warfarin taken 8/12. PTA warfarin regimen of 2.5 mg per day except 5 mg on Mon, Wed, Fri. The patient's INR is currently subtherapeutic at 1.3 after getting a boosted dose of 7.5 mg on 8/13. INR appropriately increased from 1.1 to 1.3 with boosted dose, therefore, will give another boosted dose tonight. CBC is WNL and there are no signs or symptoms of bleeding documented. The  patient is also on enoxaparin 40 mg q24h for prophylaxis while INR is subtherapeutic. Will continue and consider discontinuing when appropriate.  Goal of Therapy:  INR 2-3   Plan:  Warfarin 7.5 mg PO tonight Enoxaparin 40 mg SubQ q24h Daily PT/INR and CBC every 3 days Monitor for signs and symptoms of bleeding  Shauna Hugh, PharmD, [place]  PGY-1 Pharmacy Resident 04/19/2020 8:44 AM  Please check [email or web address] for unit-specific pharmacy phone numbers.

## 2020-04-20 ENCOUNTER — Inpatient Hospital Stay (HOSPITAL_COMMUNITY): Payer: Medicare Other

## 2020-04-20 DIAGNOSIS — I340 Nonrheumatic mitral (valve) insufficiency: Secondary | ICD-10-CM

## 2020-04-20 LAB — CBC
HCT: 39.6 % (ref 36.0–46.0)
Hemoglobin: 12.1 g/dL (ref 12.0–15.0)
MCH: 30.4 pg (ref 26.0–34.0)
MCHC: 30.6 g/dL (ref 30.0–36.0)
MCV: 99.5 fL (ref 80.0–100.0)
Platelets: 296 10*3/uL (ref 150–400)
RBC: 3.98 MIL/uL (ref 3.87–5.11)
RDW: 14 % (ref 11.5–15.5)
WBC: 10.4 10*3/uL (ref 4.0–10.5)
nRBC: 0 % (ref 0.0–0.2)

## 2020-04-20 LAB — ECHOCARDIOGRAM COMPLETE
Area-P 1/2: 4.71 cm2
Height: 57 in
MV M vel: 4.3 m/s
MV Peak grad: 74 mmHg
S' Lateral: 3.9 cm
Single Plane A4C EF: 45.8 %
Weight: 3502.67 oz

## 2020-04-20 LAB — BASIC METABOLIC PANEL
Anion gap: 11 (ref 5–15)
BUN: 15 mg/dL (ref 6–20)
CO2: 38 mmol/L — ABNORMAL HIGH (ref 22–32)
Calcium: 8.8 mg/dL — ABNORMAL LOW (ref 8.9–10.3)
Chloride: 95 mmol/L — ABNORMAL LOW (ref 98–111)
Creatinine, Ser: 0.89 mg/dL (ref 0.44–1.00)
GFR calc Af Amer: >60 mL/min (ref >60)
GFR calc non Af Amer: >60 mL/min (ref >60)
Glucose, Bld: 61 mg/dL — ABNORMAL LOW (ref 70–99)
Potassium: 3.8 mmol/L (ref 3.5–5.1)
Sodium: 144 mmol/L (ref 135–145)

## 2020-04-20 LAB — PROTIME-INR
INR: 1.5 — ABNORMAL HIGH (ref 0.8–1.2)
Prothrombin Time: 17.2 seconds — ABNORMAL HIGH (ref 11.4–15.2)

## 2020-04-20 LAB — GLUCOSE, CAPILLARY
Glucose-Capillary: 85 mg/dL (ref 70–99)
Glucose-Capillary: 193 mg/dL — ABNORMAL HIGH (ref 70–99)
Glucose-Capillary: 198 mg/dL — ABNORMAL HIGH (ref 70–99)
Glucose-Capillary: 275 mg/dL — ABNORMAL HIGH (ref 70–99)

## 2020-04-20 MED ORDER — METOPROLOL SUCCINATE ER 50 MG PO TB24
50.0000 mg | ORAL_TABLET | Freq: Every day | ORAL | Status: DC
  Administered 2020-04-21 – 2020-04-25 (×5): 50 mg via ORAL
  Filled 2020-04-20 (×5): qty 1

## 2020-04-20 MED ORDER — WARFARIN SODIUM 7.5 MG PO TABS
7.5000 mg | ORAL_TABLET | Freq: Once | ORAL | Status: AC
  Administered 2020-04-20: 7.5 mg via ORAL
  Filled 2020-04-20: qty 1

## 2020-04-20 MED ORDER — PERFLUTREN LIPID MICROSPHERE
1.0000 mL | INTRAVENOUS | Status: AC | PRN
  Administered 2020-04-20: 4 mL via INTRAVENOUS
  Filled 2020-04-20: qty 10

## 2020-04-20 NOTE — Progress Notes (Signed)
Progress Note  Patient Name: TYTEANNA OST Date of Encounter: 04/20/2020  Primary Cardiologist:   [name], MD   Subjective   Dizzy this morning.  Held Cozaar and beta blocker.  She has been up in the room.  No pain.  Breathing better but not at baseline.   Inpatient Medications    Scheduled Meds: . atorvastatin  20 mg Oral Daily  . chlorhexidine  15 mL Mouth Rinse BID  . enoxaparin (LOVENOX) injection  40 mg Subcutaneous Q24H  . furosemide  80 mg Intravenous Q12H  . gabapentin  900 mg Oral QHS  . insulin aspart  0-9 Units Subcutaneous TID WC  . insulin glargine  50 Units Subcutaneous QHS  . losartan  25 mg Oral Daily  . mouth rinse  15 mL Mouth Rinse q12n4p  . metoprolol succinate  50 mg Oral Daily  . pantoprazole  20 mg Oral Daily  . potassium chloride SA  20 mEq Oral Daily  . sertraline  50 mg Oral QHS  . tamsulosin  0.4 mg Oral Daily  . Warfarin - Pharmacist Dosing Inpatient   Does not apply q1600   Continuous Infusions:  PRN Meds: albuterol, LORazepam, nitroGLYCERIN, ondansetron **OR** ondansetron (ZOFRAN) IV, traMADol   Vital Signs    Vitals:   04/20/20 0432 04/20/20 0740 04/20/20 0744 04/20/20 0936  BP: 98/77   (!) 88/50  Pulse:  64 64 68  Resp: 18 18  18   Temp: 98 F (36.7 C)   97.6 F (36.4 C)  TempSrc: Oral   Axillary  SpO2:  95% 99% 93%  Weight: 99.3 kg     Height:        Intake/Output Summary (Last 24 hours) at 04/20/2020 1157 Last data filed at 04/20/2020 1100 Gross per 24 hour  Intake 720 ml  Output 1002 ml  Net -282 ml   Filed Weights   04/18/20 0600 04/19/20 0405 04/20/20 0432  Weight: 99.5 kg 104 kg 99.3 kg    Telemetry    NSR with few PVCs - Personally Reviewed  ECG    NA - Personally Reviewed  Physical Exam   GEN: No acute distress.   Neck: No  JVD Cardiac: RRR, no murmurs, rubs, or gallops.  Respiratory:    Diffuse coarse crackles with transmitted upper airway sounds.  GI: Soft, nontender, non-distended  MS:    Mild dependent edema; No deformity. Neuro:  Nonfocal  Psych: Normal affect   Labs    Chemistry Recent Labs  Lab 04/18/20 0510 04/19/20 1042 04/20/20 0932  NA 135 141 144  K 3.9 4.2 3.8  CL 90* 93* 95*  CO2 26 33* 38*  GLUCOSE 363* 165* 61*  BUN 18 21* 15  CREATININE 0.97 1.07* 0.89  CALCIUM 8.5* 8.8* 8.8*  GFRNONAA >60 59* >60  GFRAA >60 >60 >60  ANIONGAP 19* 15 11     Hematology Recent Labs  Lab 04/17/20 1917 04/18/20 0510 04/20/20 0932  WBC 8.7 8.4 10.4  RBC 3.91 3.93 3.98  HGB 12.0 12.1 12.1  HCT 38.3 37.7 39.6  MCV 98.0 95.9 99.5  MCH 30.7 30.8 30.4  MCHC 31.3 32.1 30.6  RDW 13.6 13.5 14.0  PLT 237 209 296    Cardiac EnzymesNo results for input(s): TROPONINI in the last 168 hours. No results for input(s): TROPIPOC in the last 168 hours.   BNP Recent Labs  Lab 04/17/20 1923  BNP 550.3*     DDimer  Recent Labs  Lab 04/18/20 0510  DDIMER 0.62*     Radiology    ECHOCARDIOGRAM COMPLETE  Result Date: 04/20/2020    ECHOCARDIOGRAM REPORT   Patient Name:   MURIAL BEAM Date of Exam: 04/20/2020 Medical Rec #:  [id number]       Height:       57.0 in Accession #:    [id number]      Weight:       218.9 lb Date of Birth:  1966/07/22       BSA:          1.869 m Patient Age:    54 years        BP:           98/77 mmHg Patient Gender: F               HR:           68 bpm. Exam Location:  Inpatient Procedure: 2D Echo, Cardiac Doppler, Color Doppler and Intracardiac            Opacification Agent Indications:    Dyspnea 786.09 / R06.00  History:        Patient has prior history of Echocardiogram examinations, most                 recent 01/13/2019. CHF, CAD, Arrythmias:STEMI; Risk                 Factors:Hypertension, Diabetes, Dyslipidemia and Current Smoker.  Sonographer:    Vickie Epley RDCS Referring Phys: [place]  Sonographer Comments: Patient is morbidly obese and Technically difficult study due to poor echo windows. Image acquisition challenging due to  patient body habitus. IMPRESSIONS  1. Left ventricular ejection fraction, by estimation, is 40 to 45%. The left ventricle has mildly decreased function. The left ventricle demonstrates regional wall motion abnormalities (see scoring diagram/findings for description). Left ventricular diastolic parameters are consistent with Grade III diastolic dysfunction (restrictive). Elevated left atrial pressure.  2. Right ventricular systolic function is normal. The right ventricular size is normal. There is mildly elevated pulmonary artery systolic pressure. The estimated right ventricular systolic pressure is 16.9 mmHg.  3. Left atrial size was mildly dilated.  4. Right atrial size was mildly dilated.  5. The mitral valve is degenerative. Mild mitral valve regurgitation.  6. The aortic valve is tricuspid. Aortic valve regurgitation is not visualized. Mild aortic valve sclerosis is present, with no evidence of aortic valve stenosis.  7. The inferior vena cava is dilated in size with <50% respiratory variability, suggesting right atrial pressure of 15 mmHg. FINDINGS  Left Ventricle: Left ventricular ejection fraction, by estimation, is 40 to 45%. The left ventricle has mildly decreased function. The left ventricle demonstrates regional wall motion abnormalities. Definity contrast agent was given IV to delineate the left ventricular endocardial borders. The left ventricular internal cavity size was normal in size. There is no left ventricular hypertrophy. Abnormal (paradoxical) septal motion consistent with post-operative status. Left ventricular diastolic parameters are consistent with Grade III diastolic dysfunction (restrictive). Elevated left atrial pressure.  LV Wall Scoring: The mid and distal anterior septum, basal inferolateral segment, basal inferior segment, and apex are hypokinetic. Right Ventricle: The right ventricular size is normal. No increase in right ventricular wall thickness. Right ventricular systolic  function is normal. There is mildly elevated pulmonary artery systolic pressure. The tricuspid regurgitant velocity is 2.36  m/s, and with an assumed right atrial pressure of 15 mmHg, the estimated right ventricular systolic pressure is 67.8 mmHg.  Left Atrium: Left atrial size was mildly dilated. Right Atrium: Right atrial size was mildly dilated. Pericardium: Trivial pericardial effusion is present. Mitral Valve: The mitral valve is degenerative in appearance. There is mild calcification of the anterior and posterior mitral valve leaflet(s). Mild mitral valve regurgitation. Tricuspid Valve: The tricuspid valve is grossly normal. Tricuspid valve regurgitation is trivial. No evidence of tricuspid stenosis. Aortic Valve: The aortic valve is tricuspid. . There is mild thickening and mild calcification of the aortic valve. Aortic valve regurgitation is not visualized. Mild aortic valve sclerosis is present, with no evidence of aortic valve stenosis. There is mild thickening of the aortic valve. There is mild calcification of the aortic valve. Pulmonic Valve: The pulmonic valve was grossly normal. Pulmonic valve regurgitation is mild. No evidence of pulmonic stenosis. Aorta: The aortic root is normal in size and structure. Venous: The inferior vena cava is dilated in size with less than 50% respiratory variability, suggesting right atrial pressure of 15 mmHg. IAS/Shunts: The atrial septum is grossly normal.  LEFT VENTRICLE PLAX 2D LVIDd:         4.80 cm     Diastology LVIDs:         3.90 cm     LV e' lateral:   4.70 cm/s LV PW:         0.90 cm     LV E/e' lateral: 25.3 LV IVS:        0.90 cm     LV e' medial:    3.16 cm/s LVOT diam:     1.70 cm     LV E/e' medial:  37.7 LV SV:         35 LV SV Index:   19 LVOT Area:     2.27 cm  LV Volumes (MOD) LV vol d, MOD A4C: 59.4 ml LV vol s, MOD A4C: 32.2 ml LV SV MOD A4C:     59.4 ml RIGHT VENTRICLE RV S prime:     5.93 cm/s TAPSE (M-mode): 1.2 cm LEFT ATRIUM             Index        RIGHT ATRIUM           Index LA diam:        5.10 cm 2.73 cm/m  RA Area:     21.50 cm LA Vol (A2C):   46.8 ml 25.04 ml/m RA Volume:   65.20 ml  34.88 ml/m LA Vol (A4C):   49.4 ml 26.43 ml/m LA Biplane Vol: 47.9 ml 25.63 ml/m  AORTIC VALVE LVOT Vmax:   90.20 cm/s LVOT Vmean:  52.500 cm/s LVOT VTI:    0.154 m  AORTA Ao Root diam: 2.80 cm MITRAL VALVE                TRICUSPID VALVE MV Area (PHT): 4.71 cm     TR Peak grad:   22.3 mmHg MV Decel Time: 161 msec     TR Vmax:        236.00 cm/s MR Peak grad: 74.0 mmHg MR Vmax:      430.00 cm/s   SHUNTS MV E velocity: 119.00 cm/s  Systemic VTI:  0.15 m MV A velocity: 36.40 cm/s   Systemic Diam: 1.70 cm MV E/A ratio:  3.27 Eleonore Chiquito MD Electronically signed by Eleonore Chiquito MD Signature Date/Time: 04/20/2020/10:46:03 AM    Final     Cardiac Studies   ECHO:    1. Left ventricular ejection fraction, by  estimation, is 40 to 45%. The  left ventricle has mildly decreased function. The left ventricle  demonstrates regional wall motion abnormalities (see scoring  diagram/findings for description). Left ventricular  diastolic parameters are consistent with Grade III diastolic dysfunction  (restrictive). Elevated left atrial pressure.  2. Right ventricular systolic function is normal. The right ventricular  size is normal. There is mildly elevated pulmonary artery systolic  pressure. The estimated right ventricular systolic pressure is 38.4 mmHg.  3. Left atrial size was mildly dilated.  4. Right atrial size was mildly dilated.  5. The mitral valve is degenerative. Mild mitral valve regurgitation.  6. The aortic valve is tricuspid. Aortic valve regurgitation is not  visualized. Mild aortic valve sclerosis is present, with no evidence of  aortic valve stenosis.  7. The inferior vena cava is dilated in size with <50% respiratory  variability, suggesting right atrial pressure of 15 mmHg.   Patient Profile     54 y.o. female with the following  hx who is being seen for the evaluation of a/c combined systolic and diastolic congestive heart failure at the request of Dr. Hosie Poisson.  Coronary artery disease  ? S/p prior PCI [facility]) and eventual CABG ([facility] - [employer] in 2007) ? S/p Inf STEMI in 2/17 >> PCI: DES to S-PDA ? Cath 03/2018: Occluded LAD, RCA; LCx stent patent; S-RCA 100, L-LAD patent; L-R collats, EF 35-45, mean PA 40  Combined systolic and diastolic CHF ? Echo 01/2019:  Inf-Lat HK, EF 40-45, Gr 2 DD, mild reduced RVSF, RVSP 58, mild to mod MR, BAE  Diabetes mellitus   Hypertension   Hyperlipidemia  COPD  Chronic respiratory failure, s/p trach in 2002  DVT (Lupus anticoagulant); chronic anticoagulation   Pulmonary hypertension   Hx of PEA arrest (dislodged tracheostomy) in 10/2018  Morbid obesity    Assessment & Plan    Acute on chronic combined systolic and diastolic CHF EF 66-59 by echocardiogram in 01/2019.   Repeat today unchanged.   Weight seems to be down.   Net negative 1.4 liters.  Continue diuresis.   Hold beta blocker and ARB as needed.   I think the most critical thing, and I discussed this with her again, is salt restriction.    Coronary artery disease s/p CABG No evidence of acute ischemic syndrome.  No change in therapy.     Chronic respiratory failure Status post trach.     Hypercoagulable state INR subtherapeutic on admit.  On DVT dose enoxaparin.  CT neg for PE.  On warfarin per pharamacy.    SVT No further SVT on tele.     For questions or updates, please contact [place] Please consult [email or web address] for contact info under Cardiology/STEMI.   Signed, [name], MD  04/20/2020, 11:57 AM

## 2020-04-20 NOTE — Progress Notes (Signed)
Patient's BP was 91/61, IV lasix ordered with no parameters; Radford Pax, MD paged. Awaiting orders.   Elaina Hoops, RN

## 2020-04-20 NOTE — Progress Notes (Signed)
  Echocardiogram 2D Echocardiogram has been performed.  Theresa [name] 04/20/2020, 9:31 AM

## 2020-04-20 NOTE — Progress Notes (Signed)
[place] for warfarin Indication: h/o VTE and Lupus  Allergies  Allergen Reactions  . Penicillins Rash    Has patient had a PCN reaction causing immediate rash, facial/tongue/throat swelling, SOB or lightheadedness with hypotension: Yes Has patient had a PCN reaction causing severe rash involving mucus membranes or skin necrosis: No Has patient had a PCN reaction that required hospitalization No Has patient had a PCN reaction occurring within the last 10 years: No If all of the above answers are "NO", then may proceed with Cephalosporin use.   REACTION: rash  . Ciprofloxacin     nausea  . Ibuprofen Rash    swelling in leg     Patient Measurements: Height: 4\' 9"  (144.8 cm) Weight: 99.3 kg (218 lb 14.7 oz) IBW/kg (Calculated) : 38.6  Vital Signs: Temp: 97.5 F (36.4 C) (08/15 1239) Temp Source: Axillary (08/15 1239) BP: 92/61 (08/15 1239) Pulse Rate: 68 (08/15 1239)  Labs: Recent Labs    04/17/20 1917 04/17/20 1917 04/17/20 2148 04/18/20 0400 04/18/20 0510 04/19/20 0339 04/19/20 1042 04/20/20 0932  HGB 12.0   < >  --   --  12.1  --   --  12.1  HCT 38.3  --   --   --  37.7  --   --  39.6  PLT 237  --   --   --  209  --   --  296  LABPROT 13.9   < >  --  14.1  --  15.4*  --  17.2*  INR 1.1   < >  --  1.1  --  1.3*  --  1.5*  CREATININE 0.97   < >  --   --  0.97  --  1.07* 0.89  TROPONINIHS 9  --  10  --   --   --   --   --    < > = values in this interval not displayed.    Estimated Creatinine Clearance: 71.8 mL/min (by C-G formula based on SCr of 0.89 mg/dL).   Medical History: Past Medical History:  Diagnosis Date  . Acute kidney failure with lesion of tubular necrosis (HCC)   . Acute systolic heart failure ([place])   . CAD (coronary artery disease)    a. s/p prior PCI. b. CABG 2007 at [facility] in [place] 2007. c. inferior STEMI 10/2015 s/p DES to dSVG-PDA.  . Cardiac arrest ([place])   . Cervical cancer ([place])    . Chronic diastolic CHF (congestive heart failure) ([place])   . Chronic respiratory failure ([place])    s/p tracheostomy 2002  . Chronic RUQ pain   . COPD (chronic obstructive pulmonary disease) ([place])   . Diabetes mellitus ([place])   . DVT (deep venous thrombosis) ([place])   . Endometriosis   . History of gallstones 01/2016   seen on Ultrasound  . History of HIDA scan 11/2016   normal  . HTN (hypertension)   . Hyperlipidemia   . Lupus anticoagulant disorder (HCC)    on coumadin  . Morbid obesity ([place])   . ST elevation (STEMI) myocardial infarction involving right coronary artery ([place]) 10/29/15   stent to VG to PDA  . Toe fracture, right 03/29/2018  . Tracheostomy in place [facility]), chronic since 2002 11/03/2015  . Tracheostomy status [facility])     Assessment: 54 yo female admitted for acute on chronic CHF, to continue PTA warfarin for a history of VTE and lupus. PTA last dose of  warfarin taken 8/12. PTA warfarin regimen of 2.5 mg per day except 5 mg on Mon, Wed, Fri. The patient's INR is currently subtherapeutic at 1.5 after getting 7.5 mg the past two days. INR has appropriately increased from 1.3 to 1.5, therefore, will give another boosted dose tonight.  CBC is WNL and there are no signs or symptoms of bleeding documented. No new interacting medications were started, there are no significant drug interactions and the patient has started to eat meals. The patient is also on enoxaparin 40 mg q24h for prophylaxis while INR is subtherapeutic. Will continue and consider discontinuing when appropriate.  Goal of Therapy:  INR 2-3   Plan:  Warfarin 7.5 mg PO tonight Enoxaparin 40 mg SubQ q24h Daily PT/INR and CBC every 3 days Monitor for signs and symptoms of bleeding  Shauna Hugh, PharmD, [place]  PGY-1 Pharmacy Resident 04/20/2020 12:46 PM  Please check [email or web address] for unit-specific pharmacy phone numbers.

## 2020-04-20 NOTE — Progress Notes (Signed)
Patient BP 88/50, HR 69, denies dizziness.  Spoke with PA Kathlen Mody regarding BP medication and lasix 80mg  IV.  Will hold Toprol XL 50 mg and Cozaar 25 mg this am and give IV lasix.

## 2020-04-20 NOTE — Progress Notes (Signed)
PROGRESS NOTE    Theresa [name]  IEP:[id number] DOB: 1965-09-26 DOA: 04/17/2020 PCP: Monico Blitz, MD    Chief Complaint  Patient presents with  . Shortness of Breath    Brief Narrative:   54 year old lady prior history of coronary artery disease s/p CABG s/p PCI, chronic respiratory failure on trach collar, chronic systolic heart failure, DVT on Coumadin presents to ED with complaints of shortness of breath and bilateral lower extremity edema. CT angio of the chest was obtained and is negative for pulmonary embolism. Marland Kitchen She was admitted for acute systolic HF and was started on IV lasix for diuresis. Her BP parameters are borderline, she might need inotrops, requested cardiology assistance for further evaluation.  Patient seen and examined at bedside she is more alert and answering questions appropriately.  No new complaints at this time   Assessment & Plan:   Principal Problem:   Acute on chronic congestive heart failure (HCC) Active Problems:   Essential hypertension   CAD, NATIVE VESSEL   Acute thromboembolism of deep veins of lower extremity (HCC)   Lupus anticoagulant syndrome (HCC)   Tracheostomy in place [facility]), chronic since 2002   Type 2 diabetes mellitus with hyperglycemia (HCC)   CHF (congestive heart failure) (HCC)   Acute on chronic combined systolic and diastolic heart failure. Superimposed on history of ischemic cardiomyopathy. Echocardiogram in May 2020 shows left ventricular ejection fraction of 40 to 45%. Patient was started on IV Lasix 80 mg twice daily.  Cardiology consulted and recommended repeating echocardiogram this admission.  Echocardiogram shows left ventricular ejection fraction 40 to 45% with mildly decreased function with regional wall abnormalities.  Left ventricular diastolic parameters are consistent with grade 3 diastolic dysfunction with elevated left atrial pressure.  Patient was able to diurese about 1.4 L since admission. Continue with IV  Lasix at this time.    Coronary artery disease s/p CABG Currently patient denies any chest pain and troponins are negative. Continue with statin.  Chronic respiratory failure s/p trach Pulmonary hygiene.   Essential hypertension Pressure parameters appear to be borderline hold beta-blocker and ARB as appropriate.    Type 2 diabetes mellitus: CBG (last 3)  Recent Labs    04/20/20 0731 04/20/20 1205 04/20/20 1626  GLUCAP 85 193* 198*   Continue with sliding scale insulin.   History of lupus anticoagulant, DVT Continue with Coumadin and Lovenox.  INR remains subtherapeutic.    DVT prophylaxis: Coumadin, Lovenox Code Status: FULL CODE.  Family Communication: none at bedside.  Disposition:   Status is: Inpatient  Remains inpatient appropriate because:IV treatments appropriate due to intensity of illness or inability to take PO   Dispo: The patient is from: Home              Anticipated d/c is to: pending.               Anticipated d/c date is: 2 days              Patient currently is not medically stable to d/c.       Consultants:   Cardiology.    Procedures: echocardiogram.    Antimicrobials: none.    Subjective: Shortness of breath and cough has improved.  Patient currently denies any chest pain, nausea or vomiting. Objective: Vitals:   04/20/20 1239 04/20/20 1536 04/20/20 1612 04/20/20 1703  BP: 92/61   113/65  Pulse: 68 81 74 72  Resp: 20 20 20    Temp: (!) 97.5 F (36.4 C)  98.6 F (37 C)   TempSrc: Axillary  Oral   SpO2: 96% 96% 99% 94%  Weight:      Height:        Intake/Output Summary (Last 24 hours) at 04/20/2020 1710 Last data filed at 04/20/2020 1400 Gross per 24 hour  Intake 840 ml  Output 1500 ml  Net -660 ml   Filed Weights   04/18/20 0600 04/19/20 0405 04/20/20 0432  Weight: 99.5 kg 104 kg 99.3 kg    Examination:  General exam: Alert and comfortable, s/p trach. Respiratory system: Decreased air entry throughout,  tachypnea present on talking, no wheezing heard.   Cardiovascular system: S1-S2 heard, regular rate rhythm, no JVD. Gastrointestinal system: Abdomen is soft, nontender, bowel sounds normal, nontender Central nervous system: Alert and answering all questions appropriately, no focal deficits evident. Extremities: Pedal edema present Skin: No rashes seen Psychiatry: Mood is appropriate   Data Reviewed: I have personally reviewed following labs and imaging studies  CBC: Recent Labs  Lab 04/17/20 1917 04/18/20 0510 04/20/20 0932  WBC 8.7 8.4 10.4  HGB 12.0 12.1 12.1  HCT 38.3 37.7 39.6  MCV 98.0 95.9 99.5  PLT 237 209 841    Basic Metabolic Panel: Recent Labs  Lab 04/17/20 1917 04/18/20 0510 04/19/20 1042 04/20/20 0932  NA 139 135 141 144  K 3.3* 3.9 4.2 3.8  CL 90* 90* 93* 95*  CO2 34* 26 33* 38*  GLUCOSE 215* 363* 165* 61*  BUN 14 18 21* 15  CREATININE 0.97 0.97 1.07* 0.89  CALCIUM 9.0 8.5* 8.8* 8.8*    GFR: Estimated Creatinine Clearance: 71.8 mL/min (by C-G formula based on SCr of 0.89 mg/dL).  Liver Function Tests: No results for input(s): AST, ALT, ALKPHOS, BILITOT, PROT, ALBUMIN in the last 168 hours.  CBG: Recent Labs  Lab 04/19/20 1603 04/19/20 2102 04/20/20 0731 04/20/20 1205 04/20/20 1626  GLUCAP 83 227* 85 193* 198*     Recent Results (from the past 240 hour(s))  SARS Coronavirus 2 by RT PCR (hospital order, performed in [facility] hospital lab) Nasopharyngeal Nasopharyngeal Swab     Status: None   Collection Time: 04/17/20 10:10 PM   Specimen: Nasopharyngeal Swab  Result Value Ref Range Status   SARS Coronavirus 2 NEGATIVE NEGATIVE Final    Comment: (NOTE) SARS-CoV-2 target nucleic acids are NOT DETECTED.  The SARS-CoV-2 RNA is generally detectable in upper and lower respiratory specimens during the acute phase of infection. The lowest concentration of SARS-CoV-2 viral copies this assay can detect is 250 copies / mL. A negative result  does not preclude SARS-CoV-2 infection and should not be used as the sole basis for treatment or other patient management decisions.  A negative result may occur with improper specimen collection / handling, submission of specimen other than nasopharyngeal swab, presence of viral mutation(s) within the areas targeted by this assay, and inadequate number of viral copies (<250 copies / mL). A negative result must be combined with clinical observations, patient history, and epidemiological information.  Fact Sheet for Patients:   StrictlyIdeas.no  Fact Sheet for Healthcare Providers: BankingDealers.co.za  This test is not yet approved or  cleared by the [place] FDA and has been authorized for detection and/or diagnosis of SARS-CoV-2 by FDA under an Emergency Use Authorization (EUA).  This EUA will remain in effect (meaning this test can be used) for the duration of the COVID-19 declaration under Section 564(b)(1) of the Act, 21 U.S.C. section 360bbb-3(b)(1), unless the authorization is terminated  or revoked sooner.  Performed at [facility], [place] [address], [place], [place] [address]          Radiology Studies: ECHOCARDIOGRAM COMPLETE  Result Date: 04/20/2020    ECHOCARDIOGRAM REPORT   Patient Name:   Theresa [name] Date of Exam: 04/20/2020 Medical Rec #:  [id number]       Height:       57.0 in Accession #:    [id number]      Weight:       218.9 lb Date of Birth:  1966-07-23       BSA:          1.869 m Patient Age:    67 years        BP:           98/77 mmHg Patient Gender: F               HR:           68 bpm. Exam Location:  Inpatient Procedure: 2D Echo, Cardiac Doppler, Color Doppler and Intracardiac            Opacification Agent Indications:    Dyspnea 786.09 / R06.00  History:        Patient has prior history of Echocardiogram examinations, most                 recent 01/13/2019. CHF, CAD, Arrythmias:STEMI; Risk                  Factors:Hypertension, Diabetes, Dyslipidemia and Current Smoker.  Sonographer:    Vickie Epley RDCS Referring Phys: [place]  Sonographer Comments: Patient is morbidly obese and Technically difficult study due to poor echo windows. Image acquisition challenging due to patient body habitus. IMPRESSIONS  1. Left ventricular ejection fraction, by estimation, is 40 to 45%. The left ventricle has mildly decreased function. The left ventricle demonstrates regional wall motion abnormalities (see scoring diagram/findings for description). Left ventricular diastolic parameters are consistent with Grade III diastolic dysfunction (restrictive). Elevated left atrial pressure.  2. Right ventricular systolic function is normal. The right ventricular size is normal. There is mildly elevated pulmonary artery systolic pressure. The estimated right ventricular systolic pressure is 39.7 mmHg.  3. Left atrial size was mildly dilated.  4. Right atrial size was mildly dilated.  5. The mitral valve is degenerative. Mild mitral valve regurgitation.  6. The aortic valve is tricuspid. Aortic valve regurgitation is not visualized. Mild aortic valve sclerosis is present, with no evidence of aortic valve stenosis.  7. The inferior vena cava is dilated in size with <50% respiratory variability, suggesting right atrial pressure of 15 mmHg. FINDINGS  Left Ventricle: Left ventricular ejection fraction, by estimation, is 40 to 45%. The left ventricle has mildly decreased function. The left ventricle demonstrates regional wall motion abnormalities. Definity contrast agent was given IV to delineate the left ventricular endocardial borders. The left ventricular internal cavity size was normal in size. There is no left ventricular hypertrophy. Abnormal (paradoxical) septal motion consistent with post-operative status. Left ventricular diastolic parameters are consistent with Grade III diastolic dysfunction (restrictive). Elevated left  atrial pressure.  LV Wall Scoring: The mid and distal anterior septum, basal inferolateral segment, basal inferior segment, and apex are hypokinetic. Right Ventricle: The right ventricular size is normal. No increase in right ventricular wall thickness. Right ventricular systolic function is normal. There is mildly elevated pulmonary artery systolic pressure. The tricuspid regurgitant velocity is 2.36  m/s, and  with an assumed right atrial pressure of 15 mmHg, the estimated right ventricular systolic pressure is 18.8 mmHg. Left Atrium: Left atrial size was mildly dilated. Right Atrium: Right atrial size was mildly dilated. Pericardium: Trivial pericardial effusion is present. Mitral Valve: The mitral valve is degenerative in appearance. There is mild calcification of the anterior and posterior mitral valve leaflet(s). Mild mitral valve regurgitation. Tricuspid Valve: The tricuspid valve is grossly normal. Tricuspid valve regurgitation is trivial. No evidence of tricuspid stenosis. Aortic Valve: The aortic valve is tricuspid. . There is mild thickening and mild calcification of the aortic valve. Aortic valve regurgitation is not visualized. Mild aortic valve sclerosis is present, with no evidence of aortic valve stenosis. There is mild thickening of the aortic valve. There is mild calcification of the aortic valve. Pulmonic Valve: The pulmonic valve was grossly normal. Pulmonic valve regurgitation is mild. No evidence of pulmonic stenosis. Aorta: The aortic root is normal in size and structure. Venous: The inferior vena cava is dilated in size with less than 50% respiratory variability, suggesting right atrial pressure of 15 mmHg. IAS/Shunts: The atrial septum is grossly normal.  LEFT VENTRICLE PLAX 2D LVIDd:         4.80 cm     Diastology LVIDs:         3.90 cm     LV e' lateral:   4.70 cm/s LV PW:         0.90 cm     LV E/e' lateral: 25.3 LV IVS:        0.90 cm     LV e' medial:    3.16 cm/s LVOT diam:     1.70 cm      LV E/e' medial:  37.7 LV SV:         35 LV SV Index:   19 LVOT Area:     2.27 cm  LV Volumes (MOD) LV vol d, MOD A4C: 59.4 ml LV vol s, MOD A4C: 32.2 ml LV SV MOD A4C:     59.4 ml RIGHT VENTRICLE RV S prime:     5.93 cm/s TAPSE (M-mode): 1.2 cm LEFT ATRIUM             Index       RIGHT ATRIUM           Index LA diam:        5.10 cm 2.73 cm/m  RA Area:     21.50 cm LA Vol (A2C):   46.8 ml 25.04 ml/m RA Volume:   65.20 ml  34.88 ml/m LA Vol (A4C):   49.4 ml 26.43 ml/m LA Biplane Vol: 47.9 ml 25.63 ml/m  AORTIC VALVE LVOT Vmax:   90.20 cm/s LVOT Vmean:  52.500 cm/s LVOT VTI:    0.154 m  AORTA Ao Root diam: 2.80 cm MITRAL VALVE                TRICUSPID VALVE MV Area (PHT): 4.71 cm     TR Peak grad:   22.3 mmHg MV Decel Time: 161 msec     TR Vmax:        236.00 cm/s MR Peak grad: 74.0 mmHg MR Vmax:      430.00 cm/s   SHUNTS MV E velocity: 119.00 cm/s  Systemic VTI:  0.15 m MV A velocity: 36.40 cm/s   Systemic Diam: 1.70 cm MV E/A ratio:  3.27 Eleonore Chiquito MD Electronically signed by Eleonore Chiquito MD Signature Date/Time: 04/20/2020/10:46:03 AM    Final  Scheduled Meds: . atorvastatin  20 mg Oral Daily  . chlorhexidine  15 mL Mouth Rinse BID  . enoxaparin (LOVENOX) injection  40 mg Subcutaneous Q24H  . furosemide  80 mg Intravenous Q12H  . gabapentin  900 mg Oral QHS  . insulin aspart  0-9 Units Subcutaneous TID WC  . insulin glargine  50 Units Subcutaneous QHS  . losartan  25 mg Oral Daily  . mouth rinse  15 mL Mouth Rinse q12n4p  . [START ON 04/21/2020] metoprolol succinate  50 mg Oral Daily  . pantoprazole  20 mg Oral Daily  . potassium chloride SA  20 mEq Oral Daily  . sertraline  50 mg Oral QHS  . tamsulosin  0.4 mg Oral Daily  . Warfarin - Pharmacist Dosing Inpatient   Does not apply q1600   Continuous Infusions:   LOS: 3 days       Hosie Poisson, MD [facility]   To contact the attending provider between 7A-7P or the covering provider during after hours  7P-7A, please log into the web site [email or web address] and access using universal [place] password for that web site. If you do not have the password, please call the hospital operator.  04/20/2020, 5:10 PM

## 2020-04-21 DIAGNOSIS — I10 Essential (primary) hypertension: Secondary | ICD-10-CM

## 2020-04-21 DIAGNOSIS — I82409 Acute embolism and thrombosis of unspecified deep veins of unspecified lower extremity: Secondary | ICD-10-CM

## 2020-04-21 DIAGNOSIS — I25110 Atherosclerotic heart disease of native coronary artery with unstable angina pectoris: Secondary | ICD-10-CM

## 2020-04-21 LAB — GLUCOSE, CAPILLARY
Glucose-Capillary: 57 mg/dL — ABNORMAL LOW (ref 70–99)
Glucose-Capillary: 78 mg/dL (ref 70–99)
Glucose-Capillary: 142 mg/dL — ABNORMAL HIGH (ref 70–99)
Glucose-Capillary: 130 mg/dL — ABNORMAL HIGH (ref 70–99)
Glucose-Capillary: 247 mg/dL — ABNORMAL HIGH (ref 70–99)

## 2020-04-21 LAB — BASIC METABOLIC PANEL
Anion gap: 10 (ref 5–15)
BUN: 16 mg/dL (ref 6–20)
CO2: 38 mmol/L — ABNORMAL HIGH (ref 22–32)
Calcium: 8.8 mg/dL — ABNORMAL LOW (ref 8.9–10.3)
Chloride: 93 mmol/L — ABNORMAL LOW (ref 98–111)
Creatinine, Ser: 0.92 mg/dL (ref 0.44–1.00)
GFR calc Af Amer: >60 mL/min (ref >60)
GFR calc non Af Amer: >60 mL/min (ref >60)
Glucose, Bld: 136 mg/dL — ABNORMAL HIGH (ref 70–99)
Potassium: 3.9 mmol/L (ref 3.5–5.1)
Sodium: 141 mmol/L (ref 135–145)

## 2020-04-21 LAB — PROTIME-INR
INR: 1.7 — ABNORMAL HIGH (ref 0.8–1.2)
Prothrombin Time: 19.2 seconds — ABNORMAL HIGH (ref 11.4–15.2)

## 2020-04-21 MED ORDER — WARFARIN SODIUM 7.5 MG PO TABS
7.5000 mg | ORAL_TABLET | Freq: Once | ORAL | Status: AC
  Administered 2020-04-21: 7.5 mg via ORAL
  Filled 2020-04-21: qty 1

## 2020-04-21 MED ORDER — FUROSEMIDE 10 MG/ML IJ SOLN
80.0000 mg | Freq: Three times a day (TID) | INTRAMUSCULAR | Status: DC
  Administered 2020-04-21 – 2020-04-23 (×8): 80 mg via INTRAVENOUS
  Filled 2020-04-21 (×8): qty 8

## 2020-04-21 MED ORDER — INSULIN GLARGINE 100 UNIT/ML ~~LOC~~ SOLN
42.0000 [IU] | Freq: Every day | SUBCUTANEOUS | Status: DC
  Administered 2020-04-21 – 2020-04-24 (×4): 42 [IU] via SUBCUTANEOUS
  Filled 2020-04-21 (×5): qty 0.42

## 2020-04-21 NOTE — Progress Notes (Signed)
[place] for warfarin Indication: h/o VTE and Lupus  Allergies  Allergen Reactions  . Penicillins Rash    Has patient had a PCN reaction causing immediate rash, facial/tongue/throat swelling, SOB or lightheadedness with hypotension: Yes Has patient had a PCN reaction causing severe rash involving mucus membranes or skin necrosis: No Has patient had a PCN reaction that required hospitalization No Has patient had a PCN reaction occurring within the last 10 years: No If all of the above answers are "NO", then may proceed with Cephalosporin use.   REACTION: rash  . Ciprofloxacin     nausea  . Ibuprofen Rash    swelling in leg     Patient Measurements: Height: 4\' 9"  (144.8 cm) Weight: 104.5 kg (230 lb 6.1 oz) IBW/kg (Calculated) : 38.6  Vital Signs: Temp: 98.1 F (36.7 C) (08/16 0805) Temp Source: Oral (08/16 0805) BP: 113/56 (08/16 0805) Pulse Rate: 74 (08/16 0805)  Labs: Recent Labs    04/19/20 0339 04/19/20 1042 04/20/20 0932 04/21/20 0302  HGB  --   --  12.1  --   HCT  --   --  39.6  --   PLT  --   --  296  --   LABPROT 15.4*  --  17.2* 19.2*  INR 1.3*  --  1.5* 1.7*  CREATININE  --  1.07* 0.89 0.92    Estimated Creatinine Clearance: 71.7 mL/min (by C-G formula based on SCr of 0.92 mg/dL).   Medical History: Past Medical History:  Diagnosis Date  . Acute kidney failure with lesion of tubular necrosis (HCC)   . Acute systolic heart failure ([place])   . CAD (coronary artery disease)    a. s/p prior PCI. b. CABG 2007 at [facility] in [place] 2007. c. inferior STEMI 10/2015 s/p DES to dSVG-PDA.  . Cardiac arrest ([place])   . Cervical cancer ([place])   . Chronic diastolic CHF (congestive heart failure) ([place])   . Chronic respiratory failure ([place])    s/p tracheostomy 2002  . Chronic RUQ pain   . COPD (chronic obstructive pulmonary disease) ([place])   . Diabetes mellitus ([place])   . DVT (deep venous thrombosis) ([place])   .  Endometriosis   . History of gallstones 01/2016   seen on Ultrasound  . History of HIDA scan 11/2016   normal  . HTN (hypertension)   . Hyperlipidemia   . Lupus anticoagulant disorder (HCC)    on coumadin  . Morbid obesity ([place])   . ST elevation (STEMI) myocardial infarction involving right coronary artery ([place]) 10/29/15   stent to VG to PDA  . Toe fracture, right 03/29/2018  . Tracheostomy in place [facility]), chronic since 2002 11/03/2015  . Tracheostomy status [facility])     Assessment: 54 yo female admitted for acute on chronic CHF, to continue PTA warfarin for a history of VTE and lupus. PTA last dose of warfarin taken 8/12. PTA warfarin regimen of 2.5 mg per day except 5 mg on Mon, Wed, Fri.  -INR= 1.7 (trend up)   Goal of Therapy:  INR 2-3   Plan:  Warfarin 7.5 mg PO tonight Enoxaparin 40 mg SubQ q24h Daily PT/INR   Theresa [name], PharmD Clinical Pharmacist **Pharmacist phone directory can now be found on [email or web address] ([email or web address]).  Listed under [place].

## 2020-04-21 NOTE — Progress Notes (Addendum)
Progress Note  Patient Name: Theresa [name] Date of Encounter: 04/21/2020  [facility] Cardiologist: [name], MD   Subjective   No acute overnight events. Patient feels like she is breathing better but still not back to baseline. She is on 7L of O2 at this time (on 4L at home). She does not feel like urine output has increased much. She notes some chest soreness from productive cough but no real pain. Occasional palpitations but no lightheadedness or dizziness.  Inpatient Medications    Scheduled Meds: . atorvastatin  20 mg Oral Daily  . chlorhexidine  15 mL Mouth Rinse BID  . enoxaparin (LOVENOX) injection  40 mg Subcutaneous Q24H  . furosemide  80 mg Intravenous Q12H  . gabapentin  900 mg Oral QHS  . insulin aspart  0-9 Units Subcutaneous TID WC  . insulin glargine  50 Units Subcutaneous QHS  . losartan  25 mg Oral Daily  . mouth rinse  15 mL Mouth Rinse q12n4p  . metoprolol succinate  50 mg Oral Daily  . pantoprazole  20 mg Oral Daily  . potassium chloride SA  20 mEq Oral Daily  . sertraline  50 mg Oral QHS  . tamsulosin  0.4 mg Oral Daily  . Warfarin - Pharmacist Dosing Inpatient   Does not apply q1600   Continuous Infusions:  PRN Meds: albuterol, LORazepam, nitroGLYCERIN, ondansetron **OR** ondansetron (ZOFRAN) IV, traMADol   Vital Signs    Vitals:   04/20/20 2100 04/21/20 0100 04/21/20 0300 04/21/20 0505  BP:   122/86   Pulse:      Resp: 18 18 20 18   Temp:   98.6 F (37 C)   TempSrc:   Oral   SpO2:      Weight:   104.5 kg   Height:        Intake/Output Summary (Last 24 hours) at 04/21/2020 0715 Last data filed at 04/20/2020 1800 Gross per 24 hour  Intake 1080 ml  Output 1450 ml  Net -370 ml   Last 3 Weights 04/21/2020 04/20/2020 04/19/2020  Weight (lbs) 230 lb 6.1 oz 218 lb 14.7 oz 229 lb 4.5 oz  Weight (kg) 104.5 kg 99.3 kg 104 kg      Telemetry    Normal sinus rhythm with rates in the 70's to 90's. Frequent PVC with some couplets and in  bigeminy pattern at times. - Personally Reviewed  ECG    No new ECG tracing today. - Personally Reviewed  Physical Exam   GEN: No acute distress.   Neck: Tracheostomy in place. Difficult to assess JVD due to body habitus and trach collar. Cardiac: RRR. No murmurs, rubs, or gallops.  Respiratory: No significant increased work of breath. Course breath sounds bilaterally with rhonchi although suspect some of this is transmitted upper airway sounds. GI: Soft, non-tender, non-distended  MS: Mild lower extremity edema bilaterally. No deformity. Neuro:  No deficits. Psych: Normal affect. Responds appropriately.   Labs    High Sensitivity Troponin:   Recent Labs  Lab 04/17/20 1917 04/17/20 2148  TROPONINIHS 9 10      Chemistry Recent Labs  Lab 04/19/20 1042 04/20/20 0932 04/21/20 0302  NA 141 144 141  K 4.2 3.8 3.9  CL 93* 95* 93*  CO2 33* 38* 38*  GLUCOSE 165* 61* 136*  BUN 21* 15 16  CREATININE 1.07* 0.89 0.92  CALCIUM 8.8* 8.8* 8.8*  GFRNONAA 59* >60 >60  GFRAA >60 >60 >60  ANIONGAP 15 11 10  Hematology Recent Labs  Lab 04/17/20 1917 04/18/20 0510 04/20/20 0932  WBC 8.7 8.4 10.4  RBC 3.91 3.93 3.98  HGB 12.0 12.1 12.1  HCT 38.3 37.7 39.6  MCV 98.0 95.9 99.5  MCH 30.7 30.8 30.4  MCHC 31.3 32.1 30.6  RDW 13.6 13.5 14.0  PLT 237 209 296    BNP Recent Labs  Lab 04/17/20 1923  BNP 550.3*     DDimer  Recent Labs  Lab 04/18/20 0510  DDIMER 0.62*     Radiology    ECHOCARDIOGRAM COMPLETE  Result Date: 04/20/2020    ECHOCARDIOGRAM REPORT   Patient Name:   Theresa [name] Date of Exam: 04/20/2020 Medical Rec #:  [id number]       Height:       57.0 in Accession #:    [id number]      Weight:       218.9 lb Date of Birth:  02-18-1966       BSA:          1.869 m Patient Age:    54 years        BP:           98/77 mmHg Patient Gender: F               HR:           68 bpm. Exam Location:  Inpatient Procedure: 2D Echo, Cardiac Doppler, Color Doppler and  Intracardiac            Opacification Agent Indications:    Dyspnea 786.09 / R06.00  History:        Patient has prior history of Echocardiogram examinations, most                 recent 01/13/2019. CHF, CAD, Arrythmias:STEMI; Risk                 Factors:Hypertension, Diabetes, Dyslipidemia and Current Smoker.  Sonographer:    Vickie Epley RDCS Referring Phys: [place]  Sonographer Comments: Patient is morbidly obese and Technically difficult study due to poor echo windows. Image acquisition challenging due to patient body habitus. IMPRESSIONS  1. Left ventricular ejection fraction, by estimation, is 40 to 45%. The left ventricle has mildly decreased function. The left ventricle demonstrates regional wall motion abnormalities (see scoring diagram/findings for description). Left ventricular diastolic parameters are consistent with Grade III diastolic dysfunction (restrictive). Elevated left atrial pressure.  2. Right ventricular systolic function is normal. The right ventricular size is normal. There is mildly elevated pulmonary artery systolic pressure. The estimated right ventricular systolic pressure is 16.0 mmHg.  3. Left atrial size was mildly dilated.  4. Right atrial size was mildly dilated.  5. The mitral valve is degenerative. Mild mitral valve regurgitation.  6. The aortic valve is tricuspid. Aortic valve regurgitation is not visualized. Mild aortic valve sclerosis is present, with no evidence of aortic valve stenosis.  7. The inferior vena cava is dilated in size with <50% respiratory variability, suggesting right atrial pressure of 15 mmHg. FINDINGS  Left Ventricle: Left ventricular ejection fraction, by estimation, is 40 to 45%. The left ventricle has mildly decreased function. The left ventricle demonstrates regional wall motion abnormalities. Definity contrast agent was given IV to delineate the left ventricular endocardial borders. The left ventricular internal cavity size was normal in size.  There is no left ventricular hypertrophy. Abnormal (paradoxical) septal motion consistent with post-operative status. Left ventricular diastolic parameters are consistent with Grade III diastolic  dysfunction (restrictive). Elevated left atrial pressure.  LV Wall Scoring: The mid and distal anterior septum, basal inferolateral segment, basal inferior segment, and apex are hypokinetic. Right Ventricle: The right ventricular size is normal. No increase in right ventricular wall thickness. Right ventricular systolic function is normal. There is mildly elevated pulmonary artery systolic pressure. The tricuspid regurgitant velocity is 2.36  m/s, and with an assumed right atrial pressure of 15 mmHg, the estimated right ventricular systolic pressure is 56.3 mmHg. Left Atrium: Left atrial size was mildly dilated. Right Atrium: Right atrial size was mildly dilated. Pericardium: Trivial pericardial effusion is present. Mitral Valve: The mitral valve is degenerative in appearance. There is mild calcification of the anterior and posterior mitral valve leaflet(s). Mild mitral valve regurgitation. Tricuspid Valve: The tricuspid valve is grossly normal. Tricuspid valve regurgitation is trivial. No evidence of tricuspid stenosis. Aortic Valve: The aortic valve is tricuspid. . There is mild thickening and mild calcification of the aortic valve. Aortic valve regurgitation is not visualized. Mild aortic valve sclerosis is present, with no evidence of aortic valve stenosis. There is mild thickening of the aortic valve. There is mild calcification of the aortic valve. Pulmonic Valve: The pulmonic valve was grossly normal. Pulmonic valve regurgitation is mild. No evidence of pulmonic stenosis. Aorta: The aortic root is normal in size and structure. Venous: The inferior vena cava is dilated in size with less than 50% respiratory variability, suggesting right atrial pressure of 15 mmHg. IAS/Shunts: The atrial septum is grossly normal.  LEFT  VENTRICLE PLAX 2D LVIDd:         4.80 cm     Diastology LVIDs:         3.90 cm     LV e' lateral:   4.70 cm/s LV PW:         0.90 cm     LV E/e' lateral: 25.3 LV IVS:        0.90 cm     LV e' medial:    3.16 cm/s LVOT diam:     1.70 cm     LV E/e' medial:  37.7 LV SV:         35 LV SV Index:   19 LVOT Area:     2.27 cm  LV Volumes (MOD) LV vol d, MOD A4C: 59.4 ml LV vol s, MOD A4C: 32.2 ml LV SV MOD A4C:     59.4 ml RIGHT VENTRICLE RV S prime:     5.93 cm/s TAPSE (M-mode): 1.2 cm LEFT ATRIUM             Index       RIGHT ATRIUM           Index LA diam:        5.10 cm 2.73 cm/m  RA Area:     21.50 cm LA Vol (A2C):   46.8 ml 25.04 ml/m RA Volume:   65.20 ml  34.88 ml/m LA Vol (A4C):   49.4 ml 26.43 ml/m LA Biplane Vol: 47.9 ml 25.63 ml/m  AORTIC VALVE LVOT Vmax:   90.20 cm/s LVOT Vmean:  52.500 cm/s LVOT VTI:    0.154 m  AORTA Ao Root diam: 2.80 cm MITRAL VALVE                TRICUSPID VALVE MV Area (PHT): 4.71 cm     TR Peak grad:   22.3 mmHg MV Decel Time: 161 msec     TR Vmax:  236.00 cm/s MR Peak grad: 74.0 mmHg MR Vmax:      430.00 cm/s   SHUNTS MV E velocity: 119.00 cm/s  Systemic VTI:  0.15 m MV A velocity: 36.40 cm/s   Systemic Diam: 1.70 cm MV E/A ratio:  3.27 Eleonore Chiquito MD Electronically signed by Eleonore Chiquito MD Signature Date/Time: 04/20/2020/10:46:03 AM    Final     Cardiac Studies   Echocardiogram 04/20/2020: Impressions: 1. Left ventricular ejection fraction, by estimation, is 40 to 45%. The  left ventricle has mildly decreased function. The left ventricle  demonstrates regional wall motion abnormalities (see scoring  diagram/findings for description). Left ventricular  diastolic parameters are consistent with Grade III diastolic dysfunction  (restrictive). Elevated left atrial pressure.  2. Right ventricular systolic function is normal. The right ventricular  size is normal. There is mildly elevated pulmonary artery systolic  pressure. The estimated right ventricular  systolic pressure is 25.3 mmHg.  3. Left atrial size was mildly dilated.  4. Right atrial size was mildly dilated.  5. The mitral valve is degenerative. Mild mitral valve regurgitation.  6. The aortic valve is tricuspid. Aortic valve regurgitation is not  visualized. Mild aortic valve sclerosis is present, with no evidence of  aortic valve stenosis.  7. The inferior vena cava is dilated in size with <50% respiratory  variability, suggesting right atrial pressure of 15 mmHg.  Patient Profile     54 y.o. female with a history of CAD with CABG in 2007 and subsequent inferior STEMI in 10/2015 s/p DES to dSVG-PDA graft, chronic combined CHF, COPD with chronic respiratory failure s/p trach in 2002, lupus anticoagulant disorder on chronic Coumadin, hypertension, hyperlipidemia, diabetes mellitus, and morbid obesity with BMI of 49.8 who was admitted on 04/17/2020 for acute on chronic combined CHF for which Cardiology was consulted.  Assessment & Plan    Acute on Chronic Combined CHF - BNP 550.  - Chest x-ray showed mild interstitial pulmonary edema.  - Chest CTA negative for PE. - Echo showed LVEF of 40-45% with hypokinesis of mid and distal anterior septum, basal inferolateral segment, basal inferior segment, and apex as well as abnormal (paradoxical) septal motion consistent with post-op status and grade 3 diastolic dysfunction. Wall motion abnormalities do not apepar to be new. - Started on IV Lasix 80mg  twice daily. Documented urinary output of 1,450 mL in the past 24 hours. Net negative 1.5 L since admission. Weights inaccurate -  Documented weight today 230 lbs but 218 yesterday. However, patient states baseline weight around 197-198 lbs. Renal function stable. - May need to increase IV Lasix to TID. Will discuss with MD. - Continue Losartan 25mg  daily and Toprol-XL 50mg  daily as BP allows. - Continue to monitor daily weight, strict I/O's, and renal function.   COPD with Chronic  Respiratory Failure - S/p tracheostomy in 2002. - Management per primary team.  CAD  - S/p CABG in 2007 with subsequent inferior STEMI in 10/2015 treated with DES to dSVG-PDA graft.  - High-sensitivity troponin negative x2. - No angina. - Continue beta-blocker and high-intensity statin. No Aspirin due to need for Coumadin.  SVT - Brief episode of SVT noted on telemetry on 04/18/2020.  - No recurrence.  - Continue Toprol-XL as above.  Hypercoagulable Disorder - Chest CTA negative for PE.  - INR subtherapeutic but improving. 1.7 today. - On Coumadin and Lovenox right now. Being managed by Pharmacy.   For questions or updates, please contact Mobile Please consult [email or web address] for contact info  under    Signed, Darreld Mclean, PA-C  04/21/2020, 7:15 AM    The patient was seen, examined and discussed with Darreld Mclean, PA-C  and I agree with the above.   The patient lays comfortably in bed in horizontal position, tracheostomy in place, there are crackles bilaterally, unable to see JVDs, her legs are warm with no significant edema.  Her weight is incorrectly recorded, she still probably about 20pounds above her baseline of 198 LBS, I would increase her Lasix to 80 mg IV 3 times daily, we will monitor closely, her creatinine is normal electrolytes normal vital signs normal.  Ena Dawley, MD 04/21/2020

## 2020-04-21 NOTE — Progress Notes (Signed)
Physical Therapy Treatment Patient Details Name: Theresa [name] MRN: [id number] DOB: [birth year] Today's Date: 04/21/2020    History of Present Illness Theresa [name] is a 54 y.o. female with history of CAD status post CABG status post stenting, chronic trach collar, chronic systolic heart failure last EF measured in 2020 was 40 to 45%, diabetes mellitus type 2, DVT on Coumadin presents to the ER with complaint of having increasing shortness of breath.  Patient states over the last 3 days patient has become progressively more short of breath than usual.  Increasing peripheral edema.  Shortness of breath increased on exertion.  No chest pain productive cough fever or chills.  Patient's primary care physician increased her torsemide dose by twice and patient took 1 dose of 120 mg of torsemide before coming to the ER.     PT Comments    Pt was seen to work on safely of mobility and standing balance with shifting off her COM and base of support.  Pt is willing to get up to walk a few steps but after challenging herself was tired.  Pt is still planning to go to rehab with home therapy and then on to outpatient.  Follow acutely to work on standing dynamic balance and endurance of gait.  Follow Up Recommendations  Home health PT;Outpatient PT;Supervision for mobility/OOB     Equipment Recommendations  None recommended by PT    Recommendations for Other Services       Precautions / Restrictions Precautions Precautions: Fall Restrictions Weight Bearing Restrictions: No    Mobility  Bed Mobility Overal bed mobility: Modified Independent                Transfers Overall transfer level: Needs assistance Equipment used: 1 person hand held assist Transfers: Sit to/from Stand;Stand Pivot Transfers Sit to Stand: Min guard Stand pivot transfers: Min guard       General transfer comment: using RW  Ambulation/Gait Ambulation/Gait assistance: Min guard Gait Distance (Feet): 3  Feet Assistive device: Rolling walker (2 wheeled);1 person hand held assist           Stairs             Wheelchair Mobility    Modified Rankin (Stroke Patients Only)       Balance Overall balance assessment: History of Falls Sitting-balance support: Feet supported Sitting balance-Leahy Scale: Fair     Standing balance support: Bilateral upper extremity supported;During functional activity Standing balance-Leahy Scale: Fair                              Cognition Arousal/Alertness: Awake/alert Behavior During Therapy: WFL for tasks assessed/performed Overall Cognitive Status: Within Functional Limits for tasks assessed                                        Exercises      General Comments General comments (skin integrity, edema, etc.): pt is able to be assisted to stand and step on side of bed  safely      Pertinent Vitals/Pain Pain Assessment: Faces Faces Pain Scale: Hurts little more Pain Location: skin irritatin from yeast Pain Descriptors / Indicators: Burning;Sore Pain Intervention(s): Repositioned;Other (comment);Monitored during session (nursing provided powder for treating skin)    Home Living  Prior Function            PT Goals (current goals can now be found in the care plan section) Acute Rehab PT Goals Patient Stated Goal: to go home PT Goal Formulation: With patient Potential to Achieve Goals: Good Progress towards PT goals: Progressing toward goals    Frequency    Min 3X/week      PT Plan Current plan remains appropriate    Co-evaluation              AM-PAC PT "6 Clicks" Mobility   Outcome Measure  Help needed turning from your back to your side while in a flat bed without using bedrails?: None Help needed moving from lying on your back to sitting on the side of a flat bed without using bedrails?: None Help needed moving to and from a bed to a chair  (including a wheelchair)?: A Little Help needed standing up from a chair using your arms (e.g., wheelchair or bedside chair)?: A Little Help needed to walk in hospital room?: A Little Help needed climbing 3-5 steps with a railing? : A Little 6 Click Score: 20    End of Session Equipment Utilized During Treatment: Gait belt;Oxygen Activity Tolerance: Patient limited by fatigue Patient left: in bed;with call bell/phone within reach Nurse Communication: Mobility status PT Visit Diagnosis: Muscle weakness (generalized) (M62.81)     Time: 8546-2703 PT Time Calculation (min) (ACUTE ONLY): 24 min  Charges:  $Therapeutic Activity: 23-37 mins                   Ramond Dial 04/21/2020, 10:28 PM  Mee Hives, PT MS Acute Rehab Dept. Number: [place] [facility] [place]

## 2020-04-21 NOTE — Progress Notes (Signed)
Inpatient Diabetes Program Recommendations  AACE/ADA: New Consensus Statement on Inpatient Glycemic Control (2015)  Target Ranges:  Prepandial:   less than 140 mg/dL      Peak postprandial:   less than 180 mg/dL (1-2 hours)      Critically ill patients:  140 - 180 mg/dL   Lab Results  Component Value Date   GLUCAP 142 (H) 04/21/2020   HGBA1C 6.3 (H) 01/13/2019    Review of Glycemic Control Results for Theresa [name], Theresa [name] (MRN [id number]) as of 04/21/2020 11:29  Ref. Range 04/20/2020 16:26 04/20/2020 20:39 04/21/2020 08:00 04/21/2020 08:26 04/21/2020 11:15  Glucose-Capillary Latest Ref Range: 70 - 99 mg/dL 198 (H) 275 (H) 57 (L) 78 142 (H)   Diabetes history: DM2 Home DM meds: Tresiba 50 U at HS, Novolog 1-10 units TID, Ozempic 0.5 mg weekly  Current meds: Lantus 50 U at HS, Novolog SENSITIVE TID   Inpatient Diabetes Program Recommendations:   Fasting CBG 57. -Decrease Lantus to 45 units daily Secure chat to Dr. Karleen Hampshire.  Thank you, Nani Gasser. Larenda Reedy, RN, MSN, CDE  Diabetes Coordinator Inpatient Glycemic Control Team Team Pager [phone] (8am-5pm) 04/21/2020 11:29 AM

## 2020-04-21 NOTE — Progress Notes (Signed)
PROGRESS NOTE    Theresa [name]  ZSM:[id number] DOB: 07/09/1966 DOA: 04/17/2020 PCP: Monico Blitz, MD    Chief Complaint  Patient presents with  . Shortness of Breath    Brief Narrative:   54 year old lady prior history of coronary artery disease s/p CABG s/p PCI, chronic respiratory failure on trach collar, chronic systolic heart failure, DVT on Coumadin presents to ED with complaints of shortness of breath and bilateral lower extremity edema. CT angio of the chest was obtained and is negative for pulmonary embolism. Marland Kitchen She was admitted for acute systolic HF and was started on IV lasix for diuresis. Her BP parameters are borderline, she might need inotrops, requested cardiology assistance for further evaluation.  Patient seen and examined at bedside she is more alert and answering questions appropriately.  No new complaints at this time   Assessment & Plan:   Principal Problem:   Acute on chronic congestive heart failure (HCC) Active Problems:   Essential hypertension   CAD, NATIVE VESSEL   Acute thromboembolism of deep veins of lower extremity (HCC)   Lupus anticoagulant syndrome (HCC)   Tracheostomy in place [facility]), chronic since 2002   Type 2 diabetes mellitus with hyperglycemia (HCC)   CHF (congestive heart failure) (HCC)   Acute on chronic combined systolic and diastolic heart failure. Superimposed on history of ischemic cardiomyopathy. Echocardiogram in May 2020 shows left ventricular ejection fraction of 40 to 45%. Patient was started on IV Lasix 80 mg twice daily.  Cardiology consulted and recommended repeating echocardiogram this admission.  Echocardiogram shows left ventricular ejection fraction 40 to 45% with mildly decreased function with regional wall abnormalities.  Left ventricular diastolic parameters are consistent with grade 3 diastolic dysfunction with elevated left atrial pressure.  Patient was able to diurese about 1.4 L since admission. Increase lasix to 80  TID. Pt reports she is on 4l it of oxygen at home, currently at 7 lit /min.    Coronary artery disease s/p CABG Currently patient denies any chest pain and troponins are negative. Continue with statin.  Chronic respiratory failure s/p trach Pulmonary hygiene.   Essential hypertension bp parameters are borderline.  hold beta-blocker and ARB as appropriate.    Type 2 diabetes mellitus: CBG (last 3)  Recent Labs    04/21/20 0800 04/21/20 0826 04/21/20 1115  GLUCAP 57* 78 142*   Hypoglycemic this am, decreased the dose of lantus to 42 units daily.    History of lupus anticoagulant, DVT Continue with Coumadin and Lovenox.  INR remains subtherapeutic. 1.7. continue with coumadin and lovenox.    DVT prophylaxis: Coumadin, Lovenox Code Status: FULL CODE.  Family Communication: none at bedside.  Disposition:   Status is: Inpatient  Remains inpatient appropriate because:IV treatments appropriate due to intensity of illness or inability to take PO   Dispo: The patient is from: Home              Anticipated d/c is to: pending.               Anticipated d/c date is: 2 days              Patient currently is not medically stable to d/c.       Consultants:   Cardiology.    Procedures: echocardiogram.    Antimicrobials: none.    Subjective: Sob improving, but not back to baseline yet. No chest pain ,n o nausea or vomiting.  Objective: Vitals:   04/21/20 1017 04/21/20 1029 04/21/20 1117 04/21/20  1226  BP:   119/81   Pulse: 80  72 72  Resp:   20 18  Temp:   98.4 F (36.9 C)   TempSrc:   Oral   SpO2:   94% 96%  Weight:  95.5 kg    Height:        Intake/Output Summary (Last 24 hours) at 04/21/2020 1422 Last data filed at 04/21/2020 1000 Gross per 24 hour  Intake 1080 ml  Output 2450 ml  Net -1370 ml   Filed Weights   04/20/20 0432 04/21/20 0300 04/21/20 1029  Weight: 99.3 kg 104.5 kg 95.5 kg    Examination:  General exam: Alert and comfortable,  not in any kind of distress s/p trach on 7 L of oxygen. Respiratory system: Diminished air entry throughout the lungs, no tachypnea present, no wheezing or rhonchi. Cardiovascular system: S1-S2 heard, regular rate rhythm, no JVD Gastrointestinal system: Abdomen is soft, nontender, bowel sounds normal Central nervous system: Alert and answering all questions appropriately Extremities: Trace pedal edema present Skin: No rashes seen Psychiatry: Mood is appropriate   Data Reviewed: I have personally reviewed following labs and imaging studies  CBC: Recent Labs  Lab 04/17/20 1917 04/18/20 0510 04/20/20 0932  WBC 8.7 8.4 10.4  HGB 12.0 12.1 12.1  HCT 38.3 37.7 39.6  MCV 98.0 95.9 99.5  PLT 237 209 272    Basic Metabolic Panel: Recent Labs  Lab 04/17/20 1917 04/18/20 0510 04/19/20 1042 04/20/20 0932 04/21/20 0302  NA 139 135 141 144 141  K 3.3* 3.9 4.2 3.8 3.9  CL 90* 90* 93* 95* 93*  CO2 34* 26 33* 38* 38*  GLUCOSE 215* 363* 165* 61* 136*  BUN 14 18 21* 15 16  CREATININE 0.97 0.97 1.07* 0.89 0.92  CALCIUM 9.0 8.5* 8.8* 8.8* 8.8*    GFR: Estimated Creatinine Clearance: 67.8 mL/min (by C-G formula based on SCr of 0.92 mg/dL).  Liver Function Tests: No results for input(s): AST, ALT, ALKPHOS, BILITOT, PROT, ALBUMIN in the last 168 hours.  CBG: Recent Labs  Lab 04/20/20 1626 04/20/20 2039 04/21/20 0800 04/21/20 0826 04/21/20 1115  GLUCAP 198* 275* 57* 78 142*     Recent Results (from the past 240 hour(s))  SARS Coronavirus 2 by RT PCR (hospital order, performed in [facility] hospital lab) Nasopharyngeal Nasopharyngeal Swab     Status: None   Collection Time: 04/17/20 10:10 PM   Specimen: Nasopharyngeal Swab  Result Value Ref Range Status   SARS Coronavirus 2 NEGATIVE NEGATIVE Final    Comment: (NOTE) SARS-CoV-2 target nucleic acids are NOT DETECTED.  The SARS-CoV-2 RNA is generally detectable in upper and lower respiratory specimens during the acute phase  of infection. The lowest concentration of SARS-CoV-2 viral copies this assay can detect is 250 copies / mL. A negative result does not preclude SARS-CoV-2 infection and should not be used as the sole basis for treatment or other patient management decisions.  A negative result may occur with improper specimen collection / handling, submission of specimen other than nasopharyngeal swab, presence of viral mutation(s) within the areas targeted by this assay, and inadequate number of viral copies (<250 copies / mL). A negative result must be combined with clinical observations, patient history, and epidemiological information.  Fact Sheet for Patients:   StrictlyIdeas.no  Fact Sheet for Healthcare Providers: BankingDealers.co.za  This test is not yet approved or  cleared by the [place] FDA and has been authorized for detection and/or diagnosis of SARS-CoV-2 by FDA  under an Emergency Use Authorization (EUA).  This EUA will remain in effect (meaning this test can be used) for the duration of the COVID-19 declaration under Section 564(b)(1) of the Act, 21 U.S.C. section 360bbb-3(b)(1), unless the authorization is terminated or revoked sooner.  Performed at [facility], [place] [address], [place], [place] [address]          Radiology Studies: ECHOCARDIOGRAM COMPLETE  Result Date: 04/20/2020    ECHOCARDIOGRAM REPORT   Patient Name:   Theresa [name] Date of Exam: 04/20/2020 Medical Rec #:  [id number]       Height:       57.0 in Accession #:    [id number]      Weight:       218.9 lb Date of Birth:  12-08-1965       BSA:          1.869 m Patient Age:    73 years        BP:           98/77 mmHg Patient Gender: F               HR:           68 bpm. Exam Location:  Inpatient Procedure: 2D Echo, Cardiac Doppler, Color Doppler and Intracardiac            Opacification Agent Indications:    Dyspnea 786.09 / R06.00  History:        Patient has  prior history of Echocardiogram examinations, most                 recent 01/13/2019. CHF, CAD, Arrythmias:STEMI; Risk                 Factors:Hypertension, Diabetes, Dyslipidemia and Current Smoker.  Sonographer:    Vickie Epley RDCS Referring Phys: [place]  Sonographer Comments: Patient is morbidly obese and Technically difficult study due to poor echo windows. Image acquisition challenging due to patient body habitus. IMPRESSIONS  1. Left ventricular ejection fraction, by estimation, is 40 to 45%. The left ventricle has mildly decreased function. The left ventricle demonstrates regional wall motion abnormalities (see scoring diagram/findings for description). Left ventricular diastolic parameters are consistent with Grade III diastolic dysfunction (restrictive). Elevated left atrial pressure.  2. Right ventricular systolic function is normal. The right ventricular size is normal. There is mildly elevated pulmonary artery systolic pressure. The estimated right ventricular systolic pressure is 26.3 mmHg.  3. Left atrial size was mildly dilated.  4. Right atrial size was mildly dilated.  5. The mitral valve is degenerative. Mild mitral valve regurgitation.  6. The aortic valve is tricuspid. Aortic valve regurgitation is not visualized. Mild aortic valve sclerosis is present, with no evidence of aortic valve stenosis.  7. The inferior vena cava is dilated in size with <50% respiratory variability, suggesting right atrial pressure of 15 mmHg. FINDINGS  Left Ventricle: Left ventricular ejection fraction, by estimation, is 40 to 45%. The left ventricle has mildly decreased function. The left ventricle demonstrates regional wall motion abnormalities. Definity contrast agent was given IV to delineate the left ventricular endocardial borders. The left ventricular internal cavity size was normal in size. There is no left ventricular hypertrophy. Abnormal (paradoxical) septal motion consistent with post-operative  status. Left ventricular diastolic parameters are consistent with Grade III diastolic dysfunction (restrictive). Elevated left atrial pressure.  LV Wall Scoring: The mid and distal anterior septum, basal inferolateral segment, basal inferior segment, and  apex are hypokinetic. Right Ventricle: The right ventricular size is normal. No increase in right ventricular wall thickness. Right ventricular systolic function is normal. There is mildly elevated pulmonary artery systolic pressure. The tricuspid regurgitant velocity is 2.36  m/s, and with an assumed right atrial pressure of 15 mmHg, the estimated right ventricular systolic pressure is 09.3 mmHg. Left Atrium: Left atrial size was mildly dilated. Right Atrium: Right atrial size was mildly dilated. Pericardium: Trivial pericardial effusion is present. Mitral Valve: The mitral valve is degenerative in appearance. There is mild calcification of the anterior and posterior mitral valve leaflet(s). Mild mitral valve regurgitation. Tricuspid Valve: The tricuspid valve is grossly normal. Tricuspid valve regurgitation is trivial. No evidence of tricuspid stenosis. Aortic Valve: The aortic valve is tricuspid. . There is mild thickening and mild calcification of the aortic valve. Aortic valve regurgitation is not visualized. Mild aortic valve sclerosis is present, with no evidence of aortic valve stenosis. There is mild thickening of the aortic valve. There is mild calcification of the aortic valve. Pulmonic Valve: The pulmonic valve was grossly normal. Pulmonic valve regurgitation is mild. No evidence of pulmonic stenosis. Aorta: The aortic root is normal in size and structure. Venous: The inferior vena cava is dilated in size with less than 50% respiratory variability, suggesting right atrial pressure of 15 mmHg. IAS/Shunts: The atrial septum is grossly normal.  LEFT VENTRICLE PLAX 2D LVIDd:         4.80 cm     Diastology LVIDs:         3.90 cm     LV e' lateral:   4.70 cm/s  LV PW:         0.90 cm     LV E/e' lateral: 25.3 LV IVS:        0.90 cm     LV e' medial:    3.16 cm/s LVOT diam:     1.70 cm     LV E/e' medial:  37.7 LV SV:         35 LV SV Index:   19 LVOT Area:     2.27 cm  LV Volumes (MOD) LV vol d, MOD A4C: 59.4 ml LV vol s, MOD A4C: 32.2 ml LV SV MOD A4C:     59.4 ml RIGHT VENTRICLE RV S prime:     5.93 cm/s TAPSE (M-mode): 1.2 cm LEFT ATRIUM             Index       RIGHT ATRIUM           Index LA diam:        5.10 cm 2.73 cm/m  RA Area:     21.50 cm LA Vol (A2C):   46.8 ml 25.04 ml/m RA Volume:   65.20 ml  34.88 ml/m LA Vol (A4C):   49.4 ml 26.43 ml/m LA Biplane Vol: 47.9 ml 25.63 ml/m  AORTIC VALVE LVOT Vmax:   90.20 cm/s LVOT Vmean:  52.500 cm/s LVOT VTI:    0.154 m  AORTA Ao Root diam: 2.80 cm MITRAL VALVE                TRICUSPID VALVE MV Area (PHT): 4.71 cm     TR Peak grad:   22.3 mmHg MV Decel Time: 161 msec     TR Vmax:        236.00 cm/s MR Peak grad: 74.0 mmHg MR Vmax:      430.00 cm/s   SHUNTS MV E velocity:  119.00 cm/s  Systemic VTI:  0.15 m MV A velocity: 36.40 cm/s   Systemic Diam: 1.70 cm MV E/A ratio:  3.27 Eleonore Chiquito MD Electronically signed by Eleonore Chiquito MD Signature Date/Time: 04/20/2020/10:46:03 AM    Final         Scheduled Meds: . atorvastatin  20 mg Oral Daily  . chlorhexidine  15 mL Mouth Rinse BID  . enoxaparin (LOVENOX) injection  40 mg Subcutaneous Q24H  . furosemide  80 mg Intravenous TID  . gabapentin  900 mg Oral QHS  . insulin aspart  0-9 Units Subcutaneous TID WC  . insulin glargine  42 Units Subcutaneous QHS  . losartan  25 mg Oral Daily  . mouth rinse  15 mL Mouth Rinse q12n4p  . metoprolol succinate  50 mg Oral Daily  . pantoprazole  20 mg Oral Daily  . potassium chloride SA  20 mEq Oral Daily  . sertraline  50 mg Oral QHS  . tamsulosin  0.4 mg Oral Daily  . warfarin  7.5 mg Oral ONCE-1600  . Warfarin - Pharmacist Dosing Inpatient   Does not apply q1600   Continuous Infusions:   LOS: 4 days        Hosie Poisson, MD [facility]   To contact the attending provider between 7A-7P or the covering provider during after hours 7P-7A, please log into the web site [email or web address] and access using universal [place] password for that web site. If you do not have the password, please call the hospital operator.  04/21/2020, 2:22 PM

## 2020-04-22 LAB — BASIC METABOLIC PANEL
Anion gap: 8 (ref 5–15)
BUN: 19 mg/dL (ref 6–20)
CO2: 39 mmol/L — ABNORMAL HIGH (ref 22–32)
Calcium: 9.3 mg/dL (ref 8.9–10.3)
Chloride: 93 mmol/L — ABNORMAL LOW (ref 98–111)
Creatinine, Ser: 0.99 mg/dL (ref 0.44–1.00)
GFR calc Af Amer: >60 mL/min (ref >60)
GFR calc non Af Amer: >60 mL/min (ref >60)
Glucose, Bld: 198 mg/dL — ABNORMAL HIGH (ref 70–99)
Potassium: 5 mmol/L (ref 3.5–5.1)
Sodium: 140 mmol/L (ref 135–145)

## 2020-04-22 LAB — GLUCOSE, CAPILLARY
Glucose-Capillary: 83 mg/dL (ref 70–99)
Glucose-Capillary: 99 mg/dL (ref 70–99)
Glucose-Capillary: 212 mg/dL — ABNORMAL HIGH (ref 70–99)
Glucose-Capillary: 308 mg/dL — ABNORMAL HIGH (ref 70–99)

## 2020-04-22 LAB — PROTIME-INR
INR: 1.9 — ABNORMAL HIGH (ref 0.8–1.2)
Prothrombin Time: 20.9 seconds — ABNORMAL HIGH (ref 11.4–15.2)

## 2020-04-22 MED ORDER — WARFARIN SODIUM 2.5 MG PO TABS
2.5000 mg | ORAL_TABLET | Freq: Once | ORAL | Status: AC
  Administered 2020-04-22: 2.5 mg via ORAL
  Filled 2020-04-22: qty 1

## 2020-04-22 NOTE — Progress Notes (Addendum)
Progress Note  Patient Name: Theresa [name] Date of Encounter: 04/22/2020  [facility] Cardiologist: [name], MD   Subjective   No acute overnight events. Patient sleeping soundly this morning.  Inpatient Medications    Scheduled Meds: . atorvastatin  20 mg Oral Daily  . chlorhexidine  15 mL Mouth Rinse BID  . enoxaparin (LOVENOX) injection  40 mg Subcutaneous Q24H  . furosemide  80 mg Intravenous TID  . gabapentin  900 mg Oral QHS  . insulin aspart  0-9 Units Subcutaneous TID WC  . insulin glargine  42 Units Subcutaneous QHS  . losartan  25 mg Oral Daily  . mouth rinse  15 mL Mouth Rinse q12n4p  . metoprolol succinate  50 mg Oral Daily  . pantoprazole  20 mg Oral Daily  . potassium chloride SA  20 mEq Oral Daily  . sertraline  50 mg Oral QHS  . tamsulosin  0.4 mg Oral Daily  . Warfarin - Pharmacist Dosing Inpatient   Does not apply q1600   Continuous Infusions:  PRN Meds: albuterol, LORazepam, nitroGLYCERIN, ondansetron **OR** ondansetron (ZOFRAN) IV, traMADol   Vital Signs    Vitals:   04/21/20 2006 04/21/20 2346 04/22/20 0024 04/22/20 0402  BP:   120/88 (!) 93/58  Pulse: 69 67    Resp: 16 14 16 15   Temp:   98.7 F (37.1 C) 98 F (36.7 C)  TempSrc:   Oral Oral  SpO2: 93% 97%    Weight:    95.6 kg  Height:        Intake/Output Summary (Last 24 hours) at 04/22/2020 0718 Last data filed at 04/22/2020 0403 Gross per 24 hour  Intake 840 ml  Output 3975 ml  Net -3135 ml   Last 3 Weights 04/22/2020 04/21/2020 04/21/2020  Weight (lbs) 210 lb 10.8 oz 210 lb 9.6 oz 230 lb 6.1 oz  Weight (kg) 95.56 kg 95.528 kg 104.5 kg      Telemetry    Normal sinus rhythm with rates in the 60's. - Personally Reviewed  ECG    No new ECG tracing since 04/17/2020. - Personally Reviewed  Physical Exam   GEN: Morbidly obese Caucasian female in no acute distress.   Neck: JVD difficult to assess due to body habitus and trach collar. Tracheostomy in place.  Cardiac:  RRR. No murmurs, rubs, or gallops.  Respiratory: No significant increased work of breathing. Bibasilar crackles noted. GI: Soft, non-tender, non-distended  MS: Lower extremity edema resolved. No deformity. Neuro:  No focal deficits. Psych: Sleeping soundly.   Labs    High Sensitivity Troponin:   Recent Labs  Lab 04/17/20 1917 04/17/20 2148  TROPONINIHS 9 10      Chemistry Recent Labs  Lab 04/20/20 0932 04/21/20 0302 04/22/20 0339  NA 144 141 140  K 3.8 3.9 5.0  CL 95* 93* 93*  CO2 38* 38* 39*  GLUCOSE 61* 136* 198*  BUN 15 16 19   CREATININE 0.89 0.92 0.99  CALCIUM 8.8* 8.8* 9.3  GFRNONAA >60 >60 >60  GFRAA >60 >60 >60  ANIONGAP 11 10 8      Hematology Recent Labs  Lab 04/17/20 1917 04/18/20 0510 04/20/20 0932  WBC 8.7 8.4 10.4  RBC 3.91 3.93 3.98  HGB 12.0 12.1 12.1  HCT 38.3 37.7 39.6  MCV 98.0 95.9 99.5  MCH 30.7 30.8 30.4  MCHC 31.3 32.1 30.6  RDW 13.6 13.5 14.0  PLT 237 209 296    BNP Recent Labs  Lab 04/17/20  1923  BNP 550.3*     DDimer  Recent Labs  Lab 04/18/20 0510  DDIMER 0.62*     Radiology    ECHOCARDIOGRAM COMPLETE  Result Date: 04/20/2020    ECHOCARDIOGRAM REPORT   Patient Name:   Theresa [name] Date of Exam: 04/20/2020 Medical Rec #:  [id number]       Height:       57.0 in Accession #:    [id number]      Weight:       218.9 lb Date of Birth:  1966/07/30       BSA:          1.869 m Patient Age:    54 years        BP:           98/77 mmHg Patient Gender: F               HR:           68 bpm. Exam Location:  Inpatient Procedure: 2D Echo, Cardiac Doppler, Color Doppler and Intracardiac            Opacification Agent Indications:    Dyspnea 786.09 / R06.00  History:        Patient has prior history of Echocardiogram examinations, most                 recent 01/13/2019. CHF, CAD, Arrythmias:STEMI; Risk                 Factors:Hypertension, Diabetes, Dyslipidemia and Current Smoker.  Sonographer:    Vickie Epley RDCS Referring Phys: Susan Shimmin  Sonographer Comments: Patient is morbidly obese and Technically difficult study due to poor echo windows. Image acquisition challenging due to patient body habitus. IMPRESSIONS  1. Left ventricular ejection fraction, by estimation, is 40 to 45%. The left ventricle has mildly decreased function. The left ventricle demonstrates regional wall motion abnormalities (see scoring diagram/findings for description). Left ventricular diastolic parameters are consistent with Grade III diastolic dysfunction (restrictive). Elevated left atrial pressure.  2. Right ventricular systolic function is normal. The right ventricular size is normal. There is mildly elevated pulmonary artery systolic pressure. The estimated right ventricular systolic pressure is 93.7 mmHg.  3. Left atrial size was mildly dilated.  4. Right atrial size was mildly dilated.  5. The mitral valve is degenerative. Mild mitral valve regurgitation.  6. The aortic valve is tricuspid. Aortic valve regurgitation is not visualized. Mild aortic valve sclerosis is present, with no evidence of aortic valve stenosis.  7. The inferior vena cava is dilated in size with <50% respiratory variability, suggesting right atrial pressure of 15 mmHg. FINDINGS  Left Ventricle: Left ventricular ejection fraction, by estimation, is 40 to 45%. The left ventricle has mildly decreased function. The left ventricle demonstrates regional wall motion abnormalities. Definity contrast agent was given IV to delineate the left ventricular endocardial borders. The left ventricular internal cavity size was normal in size. There is no left ventricular hypertrophy. Abnormal (paradoxical) septal motion consistent with post-operative status. Left ventricular diastolic parameters are consistent with Grade III diastolic dysfunction (restrictive). Elevated left atrial pressure.  LV Wall Scoring: The mid and distal anterior septum, basal inferolateral segment, basal inferior segment, and apex  are hypokinetic. Right Ventricle: The right ventricular size is normal. No increase in right ventricular wall thickness. Right ventricular systolic function is normal. There is mildly elevated pulmonary artery systolic pressure. The tricuspid regurgitant velocity is 2.36  m/s, and with  an assumed right atrial pressure of 15 mmHg, the estimated right ventricular systolic pressure is 81.2 mmHg. Left Atrium: Left atrial size was mildly dilated. Right Atrium: Right atrial size was mildly dilated. Pericardium: Trivial pericardial effusion is present. Mitral Valve: The mitral valve is degenerative in appearance. There is mild calcification of the anterior and posterior mitral valve leaflet(s). Mild mitral valve regurgitation. Tricuspid Valve: The tricuspid valve is grossly normal. Tricuspid valve regurgitation is trivial. No evidence of tricuspid stenosis. Aortic Valve: The aortic valve is tricuspid. . There is mild thickening and mild calcification of the aortic valve. Aortic valve regurgitation is not visualized. Mild aortic valve sclerosis is present, with no evidence of aortic valve stenosis. There is mild thickening of the aortic valve. There is mild calcification of the aortic valve. Pulmonic Valve: The pulmonic valve was grossly normal. Pulmonic valve regurgitation is mild. No evidence of pulmonic stenosis. Aorta: The aortic root is normal in size and structure. Venous: The inferior vena cava is dilated in size with less than 50% respiratory variability, suggesting right atrial pressure of 15 mmHg. IAS/Shunts: The atrial septum is grossly normal.  LEFT VENTRICLE PLAX 2D LVIDd:         4.80 cm     Diastology LVIDs:         3.90 cm     LV e' lateral:   4.70 cm/s LV PW:         0.90 cm     LV E/e' lateral: 25.3 LV IVS:        0.90 cm     LV e' medial:    3.16 cm/s LVOT diam:     1.70 cm     LV E/e' medial:  37.7 LV SV:         35 LV SV Index:   19 LVOT Area:     2.27 cm  LV Volumes (MOD) LV vol d, MOD A4C: 59.4 ml LV  vol s, MOD A4C: 32.2 ml LV SV MOD A4C:     59.4 ml RIGHT VENTRICLE RV S prime:     5.93 cm/s TAPSE (M-mode): 1.2 cm LEFT ATRIUM             Index       RIGHT ATRIUM           Index LA diam:        5.10 cm 2.73 cm/m  RA Area:     21.50 cm LA Vol (A2C):   46.8 ml 25.04 ml/m RA Volume:   65.20 ml  34.88 ml/m LA Vol (A4C):   49.4 ml 26.43 ml/m LA Biplane Vol: 47.9 ml 25.63 ml/m  AORTIC VALVE LVOT Vmax:   90.20 cm/s LVOT Vmean:  52.500 cm/s LVOT VTI:    0.154 m  AORTA Ao Root diam: 2.80 cm MITRAL VALVE                TRICUSPID VALVE MV Area (PHT): 4.71 cm     TR Peak grad:   22.3 mmHg MV Decel Time: 161 msec     TR Vmax:        236.00 cm/s MR Peak grad: 74.0 mmHg MR Vmax:      430.00 cm/s   SHUNTS MV E velocity: 119.00 cm/s  Systemic VTI:  0.15 m MV A velocity: 36.40 cm/s   Systemic Diam: 1.70 cm MV E/A ratio:  3.27 Eleonore Chiquito MD Electronically signed by Eleonore Chiquito MD Signature Date/Time: 04/20/2020/10:46:03 AM    Final  Cardiac Studies   Echocardiogram 04/20/2020: Impressions: 1. Left ventricular ejection fraction, by estimation, is 40 to 45%. The  left ventricle has mildly decreased function. The left ventricle  demonstrates regional wall motion abnormalities (see scoring  diagram/findings for description). Left ventricular  diastolic parameters are consistent with Grade III diastolic dysfunction  (restrictive). Elevated left atrial pressure.  2. Right ventricular systolic function is normal. The right ventricular  size is normal. There is mildly elevated pulmonary artery systolic  pressure. The estimated right ventricular systolic pressure is 84.6 mmHg.  3. Left atrial size was mildly dilated.  4. Right atrial size was mildly dilated.  5. The mitral valve is degenerative. Mild mitral valve regurgitation.  6. The aortic valve is tricuspid. Aortic valve regurgitation is not  visualized. Mild aortic valve sclerosis is present, with no evidence of  aortic valve stenosis.  7. The  inferior vena cava is dilated in size with <50% respiratory  variability, suggesting right atrial pressure of 15 mmHg.  Patient Profile     55 y.o. female with a history of with a history of CAD with CABG in 2007 and subsequent inferior STEMI in 10/2015 s/p DES to dSVG-PDA graft, chronic combined CHF, COPD with chronic respiratory failure s/p trach in 2002, lupus anticoagulant disorder on chronic Coumadin, hypertension, hyperlipidemia, diabetes mellitus, and morbid obesity with BMI of 49.8 who was admitted on 04/17/2020 for acute on chronic combined CHF for which Cardiology was consulted.  Assessment & Plan    Acute on Chronic Combined CHF - BNP 550.  - Chest x-ray showed mild interstitial pulmonary edema.  - Chest CTA negative for PE. - Echo showed LVEF of 40-45% with hypokinesis of mid and distal anterior septum, basal inferolateral segment, basal inferior segment, and apex as well as abnormal (paradoxical) septal motion consistent with post-op status and grade 3 diastolic dysfunction. Wall motion abnormalities do not apepar to be new. - IV Lasix increased to 80mg  TID yesterday. Documented urinary output of 3.9 L in the past 24 hours. Net negative 5.2 L since admission. Weights inaccurate -  Documented weight today 210 lb and looks like 220 lbs on admission if accurate. However, patient states baseline weight around 197-198 lbs. Renal function stable. - Continue current dose of Lasix.  - Continue Losartan 25mg  daily and Toprol-XL 50mg  daily as BP allows. - Continue to monitor daily weight, strict I/O's, and renal function.   COPD with Chronic Respiratory Failure - S/p tracheostomy in 2002. - Management per primary team.  CAD  - S/p CABG in 2007 with subsequent inferior STEMI in 10/2015 treated with DES to dSVG-PDA graft.  - High-sensitivity troponin negative x2. - Continue beta-blocker and high-intensity statin. No Aspirin due to need for Coumadin.  SVT - Brief episode of SVT noted on  telemetry on 04/18/2020.  - No recurrence.  - Continue Toprol-XL as above.  Hypercoagulable Disorder - Chest CTA negative for PE.  - INR subtherapeutic but improving. 1.9 today. - On Coumadin and Lovenox right now. Being managed by Pharmacy.   For questions or updates, please contact [place] Please consult [email or web address] for contact info under     Signed, Darreld Mclean, PA-C  04/22/2020, 7:18 AM    The patient was seen, examined and discussed with Darreld Mclean, PA-C  and I agree with the above.   The patient lays comfortably in bed in horizontal position, tracheostomy in place, there are mild crackles bilaterally, unable to see JVDs, her legs are warm with no significant  edema, skin in her lower extremities is now loose. We increased her Lasix to 80 mg IV 3 times daily yesterday with excellent response and -3.1 L, she is still slightly fluid overloaded, we will wait for weights today, yesterday 220 pounds with baseline of 198 pounds.  Her creatinine is stable and normal, potassium and sodium are normal. We will probably decrease the dose of diuretics tomorrow.  Ena Dawley, MD 04/22/2020

## 2020-04-22 NOTE — Progress Notes (Signed)
[place] for warfarin Indication: h/o VTE and Lupus  Allergies  Allergen Reactions  . Penicillins Rash    Has patient had a PCN reaction causing immediate rash, facial/tongue/throat swelling, SOB or lightheadedness with hypotension: Yes Has patient had a PCN reaction causing severe rash involving mucus membranes or skin necrosis: No Has patient had a PCN reaction that required hospitalization No Has patient had a PCN reaction occurring within the last 10 years: No If all of the above answers are "NO", then may proceed with Cephalosporin use.   REACTION: rash  . Ciprofloxacin     nausea  . Ibuprofen Rash    swelling in leg     Patient Measurements: Height: 4\' 9"  (144.8 cm) Weight: 95.6 kg (210 lb 10.8 oz) IBW/kg (Calculated) : 38.6  Vital Signs: Temp: 97.5 F (36.4 C) (08/17 0817) Temp Source: Oral (08/17 0817) BP: 106/71 (08/17 0904) Pulse Rate: 70 (08/17 0910)  Labs: Recent Labs    04/20/20 0932 04/21/20 0302 04/22/20 0339  HGB 12.1  --   --   HCT 39.6  --   --   PLT 296  --   --   LABPROT 17.2* 19.2* 20.9*  INR 1.5* 1.7* 1.9*  CREATININE 0.89 0.92 0.99    Estimated Creatinine Clearance: 63 mL/min (by C-G formula based on SCr of 0.99 mg/dL).   Medical History: Past Medical History:  Diagnosis Date  . Acute kidney failure with lesion of tubular necrosis (HCC)   . Acute systolic heart failure ([place])   . CAD (coronary artery disease)    a. s/p prior PCI. b. CABG 2007 at [facility] in [place] 2007. c. inferior STEMI 10/2015 s/p DES to dSVG-PDA.  . Cardiac arrest ([place])   . Cervical cancer ([place])   . Chronic diastolic CHF (congestive heart failure) ([place])   . Chronic respiratory failure ([place])    s/p tracheostomy 2002  . Chronic RUQ pain   . COPD (chronic obstructive pulmonary disease) ([place])   . Diabetes mellitus ([place])   . DVT (deep venous thrombosis) ([place])   . Endometriosis   . History of gallstones 01/2016    seen on Ultrasound  . History of HIDA scan 11/2016   normal  . HTN (hypertension)   . Hyperlipidemia   . Lupus anticoagulant disorder (HCC)    on coumadin  . Morbid obesity ([place])   . ST elevation (STEMI) myocardial infarction involving right coronary artery ([place]) 10/29/15   stent to VG to PDA  . Toe fracture, right 03/29/2018  . Tracheostomy in place [facility]), chronic since 2002 11/03/2015  . Tracheostomy status [facility])     Assessment: 54 yo female admitted for acute on chronic CHF, to continue PTA warfarin for a history of VTE and lupus. PTA last dose of warfarin taken 8/12. PTA warfarin regimen of 2.5 mg per day except 5 mg on Mon, Wed, Fri.  -INR= 1.9 (trend up)   Goal of Therapy:  INR 2-3   Plan:  Warfarin 2.5 mg PO tonight Enoxaparin 40 mg SubQ q24h Daily PT/INR   Hildred Laser, PharmD Clinical Pharmacist **Pharmacist phone directory can now be found on [email or web address] ([email or web address]).  Listed under [place].

## 2020-04-22 NOTE — Progress Notes (Signed)
PROGRESS NOTE    Theresa [name]  MHW:[id number] DOB: 1965/10/06 DOA: 04/17/2020 PCP: Monico Blitz, MD    Chief Complaint  Patient presents with  . Shortness of Breath    Brief Narrative:   54 year old lady prior history of coronary artery disease s/p CABG s/p PCI, chronic respiratory failure on trach collar, chronic systolic heart failure, DVT on Coumadin presents to ED with complaints of shortness of breath and bilateral lower extremity edema. CT angio of the chest was obtained and is negative for pulmonary embolism. Marland Kitchen She was admitted for acute systolic HF and was started on IV lasix for diuresis. Her BP parameters are borderline, she might need inotrops, requested cardiology assistance for further evaluation.  Cardiology increased her Lasix to 80 mg 3 times daily and she has diuresed about 5.7 L since admission.  Patient seen and examined today she is requesting to be discharged.  She denies any chest pain or shortness of breath at this time and reports almost is back to baseline.   Assessment & Plan:   Principal Problem:   Acute on chronic congestive heart failure (HCC) Active Problems:   Essential hypertension   CAD, NATIVE VESSEL   Acute thromboembolism of deep veins of lower extremity (HCC)   Lupus anticoagulant syndrome (HCC)   Tracheostomy in place [facility]), chronic since 2002   Type 2 diabetes mellitus with hyperglycemia (HCC)   CHF (congestive heart failure) (HCC)   Acute on chronic combined systolic and diastolic heart failure. Superimposed on history of ischemic cardiomyopathy. Echocardiogram in May 2020 shows left ventricular ejection fraction of 40 to 45%. Patient was started on IV Lasix 80 mg twice daily.  Cardiology consulted and recommended repeating echocardiogram this admission.  Echocardiogram shows left ventricular ejection fraction 40 to 45% with mildly decreased function with regional wall abnormalities.  Left ventricular diastolic parameters are consistent  with grade 3 diastolic dysfunction with elevated left atrial pressure.   Increase lasix to 80 TID. Patient has diuresed about 5.7 L since admission. Her oxygen requirement is back to baseline   Coronary artery disease s/p CABG Currently patient denies any chest pain and troponins are negative. Continue with statin.  Chronic respiratory failure s/p trach Pulmonary hygiene.   Essential hypertension Blood pressure parameters are optimal,  hold beta-blocker and ARB as appropriate.    Type 2 diabetes mellitus: CBG (last 3)  Recent Labs    04/21/20 2042 04/22/20 0813 04/22/20 1214  GLUCAP 247* 83 99   Continue with sliding scale insulin and Lantus of 42 units daily.   History of lupus anticoagulant, DVT Continue with Coumadin and Lovenox.  INR remains subtherapeutic. 1.7. continue with coumadin and lovenox.    DVT prophylaxis: Coumadin, Lovenox Code Status: FULL CODE.  Family Communication: none at bedside.  Disposition:   Status is: Inpatient  Remains inpatient appropriate because:IV treatments appropriate due to intensity of illness or inability to take PO   Dispo: The patient is from: Home              Anticipated d/c is to: Home              Anticipated d/c date is: 1 day              Patient currently is not medically stable to d/c.       Consultants:   Cardiology.    Procedures: echocardiogram.    Antimicrobials: none.    Subjective: Patient denies any chest pain or shortness of breath, no nausea  vomiting and would like to go home. Objective: Vitals:   04/22/20 0910 04/22/20 1145 04/22/20 1252 04/22/20 1516  BP:   122/78   Pulse: 70  68   Resp: 20 18 20    Temp:   97.8 F (36.6 C)   TempSrc:   Axillary   SpO2: 97%  98% 94%  Weight:      Height:        Intake/Output Summary (Last 24 hours) at 04/22/2020 1550 Last data filed at 04/22/2020 1216 Gross per 24 hour  Intake 600 ml  Output 3175 ml  Net -2575 ml   Filed Weights   04/21/20  0300 04/21/20 1029 04/22/20 0402  Weight: 104.5 kg 95.5 kg 95.6 kg    Examination:  General exam: Alert and comfortable sitting on the bed not in any kind of distress. Respiratory system: Air entry fair bilateral, no wheezing or rhonchi, no tachypnea present. Cardiovascular system: S1-S2 heard, regular rate rhythm, no JVD, no pedal edema Gastrointestinal system: Abdomen is soft, nontender nondistended, bowel sounds normal Central nervous system: Alert and answering questions appropriately, oriented and grossly nonfocal Extremities: No cyanosis. Skin: No rashes seen Psychiatry: Is appropriate   Data Reviewed: I have personally reviewed following labs and imaging studies  CBC: Recent Labs  Lab 04/17/20 1917 04/18/20 0510 04/20/20 0932  WBC 8.7 8.4 10.4  HGB 12.0 12.1 12.1  HCT 38.3 37.7 39.6  MCV 98.0 95.9 99.5  PLT 237 209 315    Basic Metabolic Panel: Recent Labs  Lab 04/18/20 0510 04/19/20 1042 04/20/20 0932 04/21/20 0302 04/22/20 0339  NA 135 141 144 141 140  K 3.9 4.2 3.8 3.9 5.0  CL 90* 93* 95* 93* 93*  CO2 26 33* 38* 38* 39*  GLUCOSE 363* 165* 61* 136* 198*  BUN 18 21* 15 16 19   CREATININE 0.97 1.07* 0.89 0.92 0.99  CALCIUM 8.5* 8.8* 8.8* 8.8* 9.3    GFR: Estimated Creatinine Clearance: 63 mL/min (by C-G formula based on SCr of 0.99 mg/dL).  Liver Function Tests: No results for input(s): AST, ALT, ALKPHOS, BILITOT, PROT, ALBUMIN in the last 168 hours.  CBG: Recent Labs  Lab 04/21/20 1115 04/21/20 1651 04/21/20 2042 04/22/20 0813 04/22/20 1214  GLUCAP 142* 130* 247* 83 99     Recent Results (from the past 240 hour(s))  SARS Coronavirus 2 by RT PCR (hospital order, performed in [facility] hospital lab) Nasopharyngeal Nasopharyngeal Swab     Status: None   Collection Time: 04/17/20 10:10 PM   Specimen: Nasopharyngeal Swab  Result Value Ref Range Status   SARS Coronavirus 2 NEGATIVE NEGATIVE Final    Comment: (NOTE) SARS-CoV-2 target nucleic  acids are NOT DETECTED.  The SARS-CoV-2 RNA is generally detectable in upper and lower respiratory specimens during the acute phase of infection. The lowest concentration of SARS-CoV-2 viral copies this assay can detect is 250 copies / mL. A negative result does not preclude SARS-CoV-2 infection and should not be used as the sole basis for treatment or other patient management decisions.  A negative result may occur with improper specimen collection / handling, submission of specimen other than nasopharyngeal swab, presence of viral mutation(s) within the areas targeted by this assay, and inadequate number of viral copies (<250 copies / mL). A negative result must be combined with clinical observations, patient history, and epidemiological information.  Fact Sheet for Patients:   StrictlyIdeas.no  Fact Sheet for Healthcare Providers: BankingDealers.co.za  This test is not yet approved or  cleared by  the [employer] and has been authorized for detection and/or diagnosis of SARS-CoV-2 by FDA under an Emergency Use Authorization (EUA).  This EUA will remain in effect (meaning this test can be used) for the duration of the COVID-19 declaration under Section 564(b)(1) of the Act, 21 U.S.C. section 360bbb-3(b)(1), unless the authorization is terminated or revoked sooner.  Performed at [facility], [place] [address], [place], [place] [address]          Radiology Studies: No results found.      Scheduled Meds: . atorvastatin  20 mg Oral Daily  . chlorhexidine  15 mL Mouth Rinse BID  . enoxaparin (LOVENOX) injection  40 mg Subcutaneous Q24H  . furosemide  80 mg Intravenous TID  . gabapentin  900 mg Oral QHS  . insulin aspart  0-9 Units Subcutaneous TID WC  . insulin glargine  42 Units Subcutaneous QHS  . losartan  25 mg Oral Daily  . mouth rinse  15 mL Mouth Rinse q12n4p  . metoprolol succinate  50 mg Oral Daily  .  pantoprazole  20 mg Oral Daily  . potassium chloride SA  20 mEq Oral Daily  . sertraline  50 mg Oral QHS  . tamsulosin  0.4 mg Oral Daily  . warfarin  2.5 mg Oral ONCE-1600  . Warfarin - Pharmacist Dosing Inpatient   Does not apply q1600   Continuous Infusions:   LOS: 5 days       Hosie Poisson, MD [facility]   To contact the attending provider between 7A-7P or the covering provider during after hours 7P-7A, please log into the web site [email or web address] and access using universal [place] password for that web site. If you do not have the password, please call the hospital operator.  04/22/2020, 3:50 PM

## 2020-04-23 DIAGNOSIS — I471 Supraventricular tachycardia: Secondary | ICD-10-CM

## 2020-04-23 DIAGNOSIS — I2510 Atherosclerotic heart disease of native coronary artery without angina pectoris: Secondary | ICD-10-CM

## 2020-04-23 LAB — GLUCOSE, CAPILLARY
Glucose-Capillary: 117 mg/dL — ABNORMAL HIGH (ref 70–99)
Glucose-Capillary: 159 mg/dL — ABNORMAL HIGH (ref 70–99)
Glucose-Capillary: 193 mg/dL — ABNORMAL HIGH (ref 70–99)
Glucose-Capillary: 287 mg/dL — ABNORMAL HIGH (ref 70–99)

## 2020-04-23 LAB — BASIC METABOLIC PANEL
Anion gap: 9 (ref 5–15)
BUN: 22 mg/dL — ABNORMAL HIGH (ref 6–20)
CO2: 37 mmol/L — ABNORMAL HIGH (ref 22–32)
Calcium: 9.1 mg/dL (ref 8.9–10.3)
Chloride: 94 mmol/L — ABNORMAL LOW (ref 98–111)
Creatinine, Ser: 0.96 mg/dL (ref 0.44–1.00)
GFR calc Af Amer: >60 mL/min (ref >60)
GFR calc non Af Amer: >60 mL/min (ref >60)
Glucose, Bld: 124 mg/dL — ABNORMAL HIGH (ref 70–99)
Potassium: 4.1 mmol/L (ref 3.5–5.1)
Sodium: 140 mmol/L (ref 135–145)

## 2020-04-23 LAB — PROTIME-INR
INR: 1.9 — ABNORMAL HIGH (ref 0.8–1.2)
Prothrombin Time: 20.9 seconds — ABNORMAL HIGH (ref 11.4–15.2)

## 2020-04-23 MED ORDER — WARFARIN SODIUM 5 MG PO TABS
5.0000 mg | ORAL_TABLET | Freq: Once | ORAL | Status: AC
  Administered 2020-04-23: 5 mg via ORAL
  Filled 2020-04-23: qty 1

## 2020-04-23 NOTE — Progress Notes (Signed)
Physical Therapy Treatment Patient Details Name: Theresa [name] MRN: [id number] DOB: Aug 02, 1966 Today's Date: 04/23/2020    History of Present Illness Theresa [name] is a 54 y.o. female with history of CAD status post CABG status post stenting, chronic trach collar, chronic systolic heart failure last EF measured in 2020 was 40 to 45%, diabetes mellitus type 2, DVT on Coumadin presents to the ER with complaint of having increasing shortness of breath.  Patient states over the last 3 days patient has become progressively more short of breath than usual.  Increasing peripheral edema.  Shortness of breath increased on exertion.  No chest pain productive cough fever or chills.  Patient's primary care physician increased her torsemide dose by twice and patient took 1 dose of 120 mg of torsemide before coming to the ER.     PT Comments    Pt making excellent progress towards her physical therapy goals, with improved activity tolerance and ambulation distance. Ambulating 100 feet with no assistive device, SpO2 93-100% on 6L O2 via trach. D/c plan remains appropriate.     Follow Up Recommendations  Home health PT;Supervision for mobility/OOB     Equipment Recommendations  None recommended by PT    Recommendations for Other Services       Precautions / Restrictions Precautions Precautions: Fall Restrictions Weight Bearing Restrictions: No    Mobility  Bed Mobility Overal bed mobility: Independent                Transfers Overall transfer level: Independent Equipment used: None                Ambulation/Gait Ambulation/Gait assistance: Supervision Gait Distance (Feet): 100 Feet Assistive device: None Gait Pattern/deviations: Step-through pattern;Decreased stride length;Wide base of support Gait velocity: decreased   General Gait Details: MIld lateral instability, no gross imbalance, supervision for safety. Cues for activity pacing   Stairs              Wheelchair Mobility    Modified Rankin (Stroke Patients Only)       Balance Overall balance assessment: History of Falls Sitting-balance support: Feet supported Sitting balance-Leahy Scale: Normal     Standing balance support: During functional activity;No upper extremity supported Standing balance-Leahy Scale: Good                              Cognition Arousal/Alertness: Awake/alert Behavior During Therapy: WFL for tasks assessed/performed Overall Cognitive Status: Within Functional Limits for tasks assessed                                        Exercises      General Comments        Pertinent Vitals/Pain Pain Assessment: Faces Faces Pain Scale: No hurt    Home Living                      Prior Function            PT Goals (current goals can now be found in the care plan section) Acute Rehab PT Goals Patient Stated Goal: to go home PT Goal Formulation: With patient Potential to Achieve Goals: Good Progress towards PT goals: Progressing toward goals    Frequency    Min 3X/week      PT Plan Current plan remains appropriate  Co-evaluation              AM-PAC PT "6 Clicks" Mobility   Outcome Measure  Help needed turning from your back to your side while in a flat bed without using bedrails?: None Help needed moving from lying on your back to sitting on the side of a flat bed without using bedrails?: None Help needed moving to and from a bed to a chair (including a wheelchair)?: None Help needed standing up from a chair using your arms (e.g., wheelchair or bedside chair)?: None Help needed to walk in hospital room?: None Help needed climbing 3-5 steps with a railing? : A Little 6 Click Score: 23    End of Session Equipment Utilized During Treatment: Oxygen Activity Tolerance: Patient tolerated treatment well Patient left: in bed;with call bell/phone within reach Nurse Communication: Mobility  status PT Visit Diagnosis: Muscle weakness (generalized) (M62.81)     Time: 2751-7001 PT Time Calculation (min) (ACUTE ONLY): 22 min  Charges:  $Therapeutic Activity: 8-22 mins                       Wyona Almas, PT, DPT [place] Pager [phone] Office [phone]    Deno Etienne 04/23/2020, 11:52 AM

## 2020-04-23 NOTE — Progress Notes (Signed)
PROGRESS NOTE    Theresa [name]  GEX:[id number] DOB: Feb 04, 1966 DOA: 04/17/2020 PCP: Monico Blitz, MD    Chief Complaint  Patient presents with  . Shortness of Breath    Brief Narrative:   54 year old lady prior history of coronary artery disease s/p CABG s/p PCI, chronic respiratory failure on trach collar, chronic systolic heart failure, DVT on Coumadin presents to ED with complaints of shortness of breath and bilateral lower extremity edema. CT angio of the chest was obtained and is negative for pulmonary embolism. Marland Kitchen She was admitted for acute systolic HF and was started on IV lasix for diuresis. Her BP parameters are borderline, she might need inotrops, requested cardiology assistance for further evaluation.  Cardiology increased her Lasix to 80 mg 3 times daily and she has diuresed about 6.5 L since admission.   Pt seen and examined. Disappointed that she is not going home today.  No new complaints. Reports feeling back to baseline.    Assessment & Plan:   Principal Problem:   Acute on chronic congestive heart failure (HCC) Active Problems:   Essential hypertension   CAD, NATIVE VESSEL   Acute thromboembolism of deep veins of lower extremity (HCC)   Lupus anticoagulant syndrome (HCC)   Tracheostomy in place [facility]), chronic since 2002   Type 2 diabetes mellitus with hyperglycemia (HCC)   CHF (congestive heart failure) (HCC)   Acute on chronic combined systolic and diastolic heart failure. Superimposed on history of ischemic cardiomyopathy. Echocardiogram in May 2020 shows left ventricular ejection fraction of 40 to 45%. Patient was started on IV Lasix 80 mg twice daily.  Cardiology consulted and recommended repeating echocardiogram this admission.  Echocardiogram shows left ventricular ejection fraction 40 to 45% with mildly decreased function with regional wall abnormalities.  Left ventricular diastolic parameters are consistent with grade 3 diastolic dysfunction with  elevated left atrial pressure.   Increase lasix to 80 TID. Patient has diuresed about 6.5 L since admission.transition to oral lasix tomorrow and plan for discharge. Creatinine remains stable.  Her oxygen requirement is back to baseline   Coronary artery disease s/p CABG Currently patient denies any chest pain.  and troponins are negative. Continue with statin.  Chronic respiratory failure s/p trach Pulmonary hygiene.   Essential hypertension BP parameters are optimal,  hold beta-blocker and ARB as appropriate.    Type 2 diabetes mellitus: CBG (last 3)  Recent Labs    04/22/20 2109 04/23/20 0750 04/23/20 1117  GLUCAP 308* 117* 159*  continue with lantus and SSI.    History of lupus anticoagulant, DVT Continue with Coumadin and Lovenox.  INR remains subtherapeutic. 1.9. continue with coumadin and lovenox.    DVT prophylaxis: Coumadin, Lovenox Code Status: FULL CODE.  Family Communication: none at bedside.  Disposition:   Status is: Inpatient  Remains inpatient appropriate because:IV treatments appropriate due to intensity of illness or inability to take PO   Dispo: The patient is from: Home              Anticipated d/c is to: Home              Anticipated d/c date is: 1 day              Patient currently is not medically stable to d/c.       Consultants:   Cardiology.    Procedures: echocardiogram.    Antimicrobials: none.    Subjective: Pt denies any chest pain or sob. Wants to know when to go  home.  Objective: Vitals:   04/23/20 0859 04/23/20 1026 04/23/20 1141 04/23/20 1311  BP:    128/72  Pulse:  62  63  Resp: 18  18 19   Temp:    (!) 97.5 F (36.4 C)  TempSrc:    Oral  SpO2: 96%   96%  Weight:      Height:        Intake/Output Summary (Last 24 hours) at 04/23/2020 1707 Last data filed at 04/23/2020 1624 Gross per 24 hour  Intake 920 ml  Output 2150 ml  Net -1230 ml   Filed Weights   04/21/20 1029 04/22/20 0402 04/23/20 0655    Weight: 95.5 kg 95.6 kg 93.5 kg    Examination:  General exam: alert and comfortable on 4 it of Oxygen.  Respiratory system: air entry fair, no wheezng heard.  Cardiovascular system: S1S2 heard, RRR, no JVD, no pedal edema Gastrointestinal system:  abd is soft, non tender non distended, bowel sounds wnl.  Central nervous system: alert and oriented. Grossly non focal.  Extremities: no cyanosis.  Skin: no rashes seen.  Psychiatry:  Mood Is appropriate   Data Reviewed: I have personally reviewed following labs and imaging studies  CBC: Recent Labs  Lab 04/17/20 1917 04/18/20 0510 04/20/20 0932  WBC 8.7 8.4 10.4  HGB 12.0 12.1 12.1  HCT 38.3 37.7 39.6  MCV 98.0 95.9 99.5  PLT 237 209 644    Basic Metabolic Panel: Recent Labs  Lab 04/19/20 1042 04/20/20 0932 04/21/20 0302 04/22/20 0339 04/23/20 0642  NA 141 144 141 140 140  K 4.2 3.8 3.9 5.0 4.1  CL 93* 95* 93* 93* 94*  CO2 33* 38* 38* 39* 37*  GLUCOSE 165* 61* 136* 198* 124*  BUN 21* 15 16 19  22*  CREATININE 1.07* 0.89 0.92 0.99 0.96  CALCIUM 8.8* 8.8* 8.8* 9.3 9.1    GFR: Estimated Creatinine Clearance: 64.1 mL/min (by C-G formula based on SCr of 0.96 mg/dL).  Liver Function Tests: No results for input(s): AST, ALT, ALKPHOS, BILITOT, PROT, ALBUMIN in the last 168 hours.  CBG: Recent Labs  Lab 04/22/20 1214 04/22/20 1626 04/22/20 2109 04/23/20 0750 04/23/20 1117  GLUCAP 99 212* 308* 117* 159*     Recent Results (from the past 240 hour(s))  SARS Coronavirus 2 by RT PCR (hospital order, performed in [facility] hospital lab) Nasopharyngeal Nasopharyngeal Swab     Status: None   Collection Time: 04/17/20 10:10 PM   Specimen: Nasopharyngeal Swab  Result Value Ref Range Status   SARS Coronavirus 2 NEGATIVE NEGATIVE Final    Comment: (NOTE) SARS-CoV-2 target nucleic acids are NOT DETECTED.  The SARS-CoV-2 RNA is generally detectable in upper and lower respiratory specimens during the acute phase of  infection. The lowest concentration of SARS-CoV-2 viral copies this assay can detect is 250 copies / mL. A negative result does not preclude SARS-CoV-2 infection and should not be used as the sole basis for treatment or other patient management decisions.  A negative result may occur with improper specimen collection / handling, submission of specimen other than nasopharyngeal swab, presence of viral mutation(s) within the areas targeted by this assay, and inadequate number of viral copies (<250 copies / mL). A negative result must be combined with clinical observations, patient history, and epidemiological information.  Fact Sheet for Patients:   StrictlyIdeas.no  Fact Sheet for Healthcare Providers: BankingDealers.co.za  This test is not yet approved or  cleared by the [place] FDA and has been  authorized for detection and/or diagnosis of SARS-CoV-2 by FDA under an Emergency Use Authorization (EUA).  This EUA will remain in effect (meaning this test can be used) for the duration of the COVID-19 declaration under Section 564(b)(1) of the Act, 21 U.S.C. section 360bbb-3(b)(1), unless the authorization is terminated or revoked sooner.  Performed at [facility], [place] [address], [place], [place] [address]          Radiology Studies: No results found.      Scheduled Meds: . atorvastatin  20 mg Oral Daily  . chlorhexidine  15 mL Mouth Rinse BID  . enoxaparin (LOVENOX) injection  40 mg Subcutaneous Q24H  . furosemide  80 mg Intravenous TID  . gabapentin  900 mg Oral QHS  . insulin aspart  0-9 Units Subcutaneous TID WC  . insulin glargine  42 Units Subcutaneous QHS  . losartan  25 mg Oral Daily  . mouth rinse  15 mL Mouth Rinse q12n4p  . metoprolol succinate  50 mg Oral Daily  . pantoprazole  20 mg Oral Daily  . potassium chloride SA  20 mEq Oral Daily  . sertraline  50 mg Oral QHS  . tamsulosin  0.4 mg Oral  Daily  . Warfarin - Pharmacist Dosing Inpatient   Does not apply q1600   Continuous Infusions:   LOS: 6 days       Hosie Poisson, MD [facility]   To contact the attending provider between 7A-7P or the covering provider during after hours 7P-7A, please log into the web site [email or web address] and access using universal [place] password for that web site. If you do not have the password, please call the hospital operator.  04/23/2020, 5:07 PM

## 2020-04-23 NOTE — TOC Initial Note (Signed)
Transition of Care (TOC) - Initial/Assessment Note  Marvetta Gibbons RN, BSN Transitions of Care Unit 4E- RN Case Manager See Treatment Team for direct phone # Cross Coverage for [place]    Patient Details  Name: Theresa [name] MRN: [id number] Date of Birth: 1966-03-20  Transition of Care [facility]) CM/SW Contact:    Dawayne Patricia, RN Phone Number: 04/23/2020, 3:37 PM  Clinical Narrative:                 Pt admitted from home with spouse, has chronic trach. Orders placed for HHRN/PT, CM spoke with pt at bedside for transition of care needs- address, phone #s, PCP all confirmed in epic. Discussed HH with pt and list provided for [facility] choice Per CMS guidelines from medicare.gov website with star ratings (copy placed in shadow chart)- per pt she uses [facility] for her trach and DME needs- and would like to use [facility] for [facility] services. Pt states she will have transportation home per family.  Call made to [facility] with [facility] for [facility] referral- referral accepted with start of care within 48 hr.   Expected Discharge Plan: [place] Barriers to Discharge: Continued Medical Work up   Patient Goals and CMS Choice Patient states their goals for this hospitalization and ongoing recovery are:: be able to feel better and breathe better CMS Medicare.gov Compare Post Acute Care list provided to:: Patient Choice offered to / list presented to : Patient  Expected Discharge Plan and Services Expected Discharge Plan: [place]   Discharge Planning Services: CM Consult Post Acute Care Choice: [place] arrangements for the past 2 months: Single Family Home                 DME Arranged: N/A DME Agency: NA       HH Arranged: PT, RN [place] Agency: [place] ([place]) Date HH Agency Contacted: 04/23/20 Time [place]: [place] Representative spoke with at [place]: Butch Penny  Prior Living Arrangements/Services Living arrangements for the past 2  months: [place] Lives with:: Spouse Patient language and need for interpreter reviewed:: Yes        Need for Family Participation in Patient Care: Yes (Comment) Care giver support system in place?: Yes (comment) Current home services: DME, Homehealth aide Criminal Activity/Legal Involvement Pertinent to Current Situation/Hospitalization: No - Comment as needed  Activities of Daily Living Home Assistive Devices/Equipment: Cane (specify quad or straight), Walker (specify type), CBG Meter ADL Screening (condition at time of admission) Patient's cognitive ability adequate to safely complete daily activities?: Yes Is the patient deaf or have difficulty hearing?: No Does the patient have difficulty seeing, even when wearing glasses/contacts?: No Does the patient have difficulty concentrating, remembering, or making decisions?: No Patient able to express need for assistance with ADLs?: Yes Does the patient have difficulty dressing or bathing?: No Independently performs ADLs?: Yes (appropriate for developmental age) Does the patient have difficulty walking or climbing stairs?: Yes Weakness of Legs: Both Weakness of Arms/Hands: None  Permission Sought/Granted Permission sought to share information with : Facility Art therapist granted to share information with : Yes, Verbal Permission Granted     Permission granted to share info w AGENCY: [facility]        Emotional Assessment Appearance:: Appears stated age Attitude/Demeanor/Rapport: Engaged Affect (typically observed): Appropriate Orientation: : Oriented to Self, Oriented to Place, Oriented to  Time, Oriented to Situation Alcohol / Substance Use: Not Applicable  Psych Involvement: No (comment)  Admission diagnosis:  Acute respiratory failure (HCC) [J96.00] Acute on chronic congestive heart failure, unspecified heart failure type (HCC) [I50.9] CHF (congestive heart failure) ([place]) [I50.9] Patient Active Problem  List   Diagnosis Date Noted  . CHF (congestive heart failure) ([place]) 04/18/2020  . Acute respiratory failure ([place]) 04/17/2020  . Chronic venous insufficiency 06/19/2019  . Cholelithiasis 05/07/2019  . Adult body mass index 50.0-59.9 ([place]) 03/22/2019  . Anxiety 03/22/2019  . Low back pain 03/22/2019  . Cigarette nicotine dependence without complication 81/44/8185  . History of vitamin D deficiency 03/22/2019  . Insomnia 03/22/2019  . Knee pain 03/22/2019  . Localized, primary osteoarthritis of lower leg, unspecified laterality 03/22/2019  . Neck pain 03/22/2019  . Psoriasis 03/22/2019  . Thrombophlebitis of deep veins of lower extremity ([place]) 03/22/2019  . Deep vein thrombosis ([place]) 03/22/2019  . Fracture of humeral shaft, left, closed 03/22/2019  . Chronic respiratory insufficiency 01/19/2019  . CAD (coronary artery disease) 01/15/2019  . Educated about COVID-19 virus infection 01/12/2019  . Acute on chronic combined systolic and diastolic HF (heart failure) ([place]) 01/12/2019  . Acute on chronic respiratory failure with hypoxia ([place]) 10/24/2018  . Pulmonary hypertension, unspecified ([place])   . Acute on chronic respiratory failure with hypoxia ([place]) 08/01/2017  . Uncontrolled type 2 diabetes mellitus with hyperglycemia, with long-term current use of insulin ([place]) 08/01/2017  . Dyspnea 07/31/2017  . Acute on chronic congestive heart failure ([place])   . Type 2 diabetes mellitus with hyperglycemia ([place]) 01/22/2016  . CAD (coronary artery disease) of artery bypass graft: PTCA/DES to distal body VG to PDA 10/29/15 11/03/2015  . Tracheostomy in place [facility]), chronic since 2002 11/03/2015  . Hypokalemia 11/03/2015  . Lupus anticoagulant syndrome ([place])   . Chronic diastolic CHF (congestive heart failure) ([place])   . Respiratory failure ([place])   . Coronary artery disease involving coronary bypass graft of native heart without angina pectoris   . OSA (obstructive sleep apnea)   . COPD exacerbation  ([place])   . Tobacco abuse   . DM (diabetes mellitus) ([place]) 06/11/2009  . Hyperlipidemia LDL goal <70 06/11/2009  . Acute thromboembolism of deep veins of lower extremity ([place]) 06/11/2009  . History of CABG 06/11/2009  . Essential hypertension 05/27/2009  . CAD, NATIVE VESSEL 05/27/2009   PCP:  Monico Blitz, MD Pharmacy:   [facility] [address], [place] [address] Phone: [phone] Fax: [phone]  [facility] [place] - [address] [place] [address] Phone: [phone] Fax: [phone]     Social Determinants of Health (SDOH) Interventions    Readmission Risk Interventions Readmission Risk Prevention Plan 05/17/2019  Transportation Screening Complete  PCP or Specialist Appt within 3-5 Days Complete  HRI or [place] Complete  Social Work Consult for [place] Planning/Counseling Complete  Palliative Care Screening Not Applicable  Medication Review Press photographer) Complete  Some recent data might be hidden

## 2020-04-23 NOTE — Progress Notes (Addendum)
Progress Note  Patient Name: Theresa [name] Date of Encounter: 04/23/2020  [facility] Cardiologist: [name], MD   Subjective   No acute overnight events. Patient feels she is breathing much better and thinks she is close to baseline. She is eager to go home. No chest pain. Lower extremity improved. Her only complaint this morning is that the skin on her legs from where her legs were so edematous.  Inpatient Medications    Scheduled Meds: . atorvastatin  20 mg Oral Daily  . chlorhexidine  15 mL Mouth Rinse BID  . enoxaparin (LOVENOX) injection  40 mg Subcutaneous Q24H  . furosemide  80 mg Intravenous TID  . gabapentin  900 mg Oral QHS  . insulin aspart  0-9 Units Subcutaneous TID WC  . insulin glargine  42 Units Subcutaneous QHS  . losartan  25 mg Oral Daily  . mouth rinse  15 mL Mouth Rinse q12n4p  . metoprolol succinate  50 mg Oral Daily  . pantoprazole  20 mg Oral Daily  . potassium chloride SA  20 mEq Oral Daily  . sertraline  50 mg Oral QHS  . tamsulosin  0.4 mg Oral Daily  . Warfarin - Pharmacist Dosing Inpatient   Does not apply q1600   Continuous Infusions:  PRN Meds: albuterol, LORazepam, nitroGLYCERIN, ondansetron **OR** ondansetron (ZOFRAN) IV, traMADol   Vital Signs    Vitals:   04/22/20 1629 04/22/20 2111 04/22/20 2115 04/23/20 0655  BP: 100/79 109/66  115/64  Pulse: 61 63  (!) 56  Resp: 20 18  19   Temp: 98.2 F (36.8 C) 98.5 F (36.9 C)  (!) 97.5 F (36.4 C)  TempSrc: Oral Oral  Oral  SpO2: 100% 98% 99% 98%  Weight:    93.5 kg  Height:        Intake/Output Summary (Last 24 hours) at 04/23/2020 0816 Last data filed at 04/23/2020 0658 Gross per 24 hour  Intake 800 ml  Output 1850 ml  Net -1050 ml   Last 3 Weights 04/23/2020 04/22/2020 04/21/2020  Weight (lbs) 206 lb 1.6 oz 210 lb 10.8 oz 210 lb 9.6 oz  Weight (kg) 93.486 kg 95.56 kg 95.528 kg      Telemetry    Normal sinus rhythm with rates in the 60's to 70's. - Personally  Reviewed  ECG    No new ECG tracing since 04/17/2020. - Personally Reviewed  Physical Exam   DVV:OHYWVPXT obese Caucasian female in no acute distress.   Neck:JVD difficult to assess due to body habitus and trach collar. Tracheostomy in place.  Cardiac:RRR. No murmurs, rubs, or gallops.  Respiratory:No significant increased work of breathing. Bibasilar crackles still noted.  GG:YIRS, non-tender, non-distended  MS:No lower extremity edema. Anterior lower extremities very tender to the touch. No deformity. Skin: Warm and dry. Neuro:Alert but falls asleep easily during exam.No focal deficits.  Psych: Normal affects. Responds appropriately.  Labs    High Sensitivity Troponin:   Recent Labs  Lab 04/17/20 1917 04/17/20 2148  TROPONINIHS 9 10      Chemistry Recent Labs  Lab 04/21/20 0302 04/22/20 0339 04/23/20 0642  NA 141 140 140  K 3.9 5.0 4.1  CL 93* 93* 94*  CO2 38* 39* 37*  GLUCOSE 136* 198* 124*  BUN 16 19 22*  CREATININE 0.92 0.99 0.96  CALCIUM 8.8* 9.3 9.1  GFRNONAA >60 >60 >60  GFRAA >60 >60 >60  ANIONGAP 10 8 9      Hematology Recent Labs  Lab  04/17/20 1917 04/18/20 0510 04/20/20 0932  WBC 8.7 8.4 10.4  RBC 3.91 3.93 3.98  HGB 12.0 12.1 12.1  HCT 38.3 37.7 39.6  MCV 98.0 95.9 99.5  MCH 30.7 30.8 30.4  MCHC 31.3 32.1 30.6  RDW 13.6 13.5 14.0  PLT 237 209 296    BNP Recent Labs  Lab 04/17/20 1923  BNP 550.3*     DDimer  Recent Labs  Lab 04/18/20 0510  DDIMER 0.62*     Radiology    No results found.  Cardiac Studies   Echocardiogram 04/20/2020: Impressions: 1. Left ventricular ejection fraction, by estimation, is 40 to 45%. The  left ventricle has mildly decreased function. The left ventricle  demonstrates regional wall motion abnormalities (see scoring  diagram/findings for description). Left ventricular  diastolic parameters are consistent with Grade III diastolic dysfunction  (restrictive). Elevated left atrial  pressure.  2. Right ventricular systolic function is normal. The right ventricular  size is normal. There is mildly elevated pulmonary artery systolic  pressure. The estimated right ventricular systolic pressure is 50.9 mmHg.  3. Left atrial size was mildly dilated.  4. Right atrial size was mildly dilated.  5. The mitral valve is degenerative. Mild mitral valve regurgitation.  6. The aortic valve is tricuspid. Aortic valve regurgitation is not  visualized. Mild aortic valve sclerosis is present, with no evidence of  aortic valve stenosis.  7. The inferior vena cava is dilated in size with <50% respiratory  variability, suggesting right atrial pressure of 15 mmHg.  Patient Profile     54 y.o. female with a history of with a history of CAD with CABG in 2007 and subsequent inferior STEMI in 10/2015 s/p DES to dSVG-PDA graft, chronic combined CHF, COPD with chronic respiratory failure s/p trach in 2002, lupus anticoagulant disorder on chronic Coumadin, hypertension, hyperlipidemia, diabetes mellitus, and morbid obesity with BMI of 49.8 who was admitted on 04/17/2020 for acute on chronic combined CHF for which Cardiology was consulted.  Assessment & Plan    Acute on Chronic Combined CHF - BNP 550.  - Chest x-ray showed mild interstitial pulmonary edema.  - Chest CTA negative for PE. - Echo showed LVEF of 40-45% with hypokinesis of mid and distal anterior septum, basal inferolateral segment, basal inferior segment, and apex as well as abnormal (paradoxical) septal motion consistent with post-op status and grade 3 diastolic dysfunction. Wall motion abnormalities do not apepar to be new. - IV Lasix increased to 80mg  TID on 8/16 with better urinary response. Documented urinary output of 1.85 L in the past 24 hours. Weight 2-6 lbs today, down 4 lbs from yesterday and and 14 lbs from admission.However, patient states baseline weight around 197-198 lbs. Creatinine stable but BUN starting to creep  up. -She still has some bibasilar crackles on exam but lower extremity edema improved.  - May be able to switch to PO soon (possibly tomorrow). Will discuss appropriate dosing with MD. Patient reportedly on Torsemide 120mg  daily at home. - Continue Losartan 25mg  daily and Toprol-XL 50mg  daily asBP allows. - Continue to monitor daily weight, strict I/O's, and renal function.   COPD with Chronic Respiratory Failure - S/p tracheostomy in 2002. - Management per primary team.  CAD  - S/p CABG in 2007 with subsequent inferior STEMI in 10/2015 treated with DES to dSVG-PDA graft.  - High-sensitivity troponin negative x2. - No angina. - Continue beta-blocker and high-intensity statin. No Aspirin due to need for Coumadin.  SVT - Brief episode of SVT  noted on telemetry on 04/18/2020.  - No recurrence. - Continue Toprol-XL as above.  Hypercoagulable Disorder - Chest CTA negative for PE.  - INR subtherapeutic but improving. INR 1.9 today. - On Coumadin and Lovenox right now. Being managed by Pharmacy.  Otherwise, per primary team.  For questions or updates, please contact [place] Please consult [email or web address] for contact info under        Signed, Darreld Mclean, PA-C  04/23/2020, 8:16 AM    Patient seen and examined.  Agree with above documentation.  On exam, patient is alert and oriented, regular rate and rhythm, no murmurs, bibasilar crackles, no LE edema or JVD.  Net -1.1 L yesterday on IV Lasix 80 mg 3 times daily.  Weight is down 4 pounds.  Creatinine stable at 0.96.  Telemetry personally reviewed and shows sinus rhythm with rate 50s to 60s.  Recommend continuing IV Lasix for today, may be able to convert to PO tomorrow.  Donato Heinz, MD

## 2020-04-23 NOTE — Care Management Important Message (Signed)
Important Message  Patient Details  Name: Theresa [name] MRN: [id number] Date of Birth: 1966-02-13   Medicare Important Message Given:  Yes Pt. On precautions gave the IM letter to NS to give to pt.     Holli Humbles Smith 04/23/2020, 12:17 PM

## 2020-04-23 NOTE — Progress Notes (Signed)
[place] for warfarin Indication: h/o VTE and Lupus  Allergies  Allergen Reactions  . Penicillins Rash    Has patient had a PCN reaction causing immediate rash, facial/tongue/throat swelling, SOB or lightheadedness with hypotension: Yes Has patient had a PCN reaction causing severe rash involving mucus membranes or skin necrosis: No Has patient had a PCN reaction that required hospitalization No Has patient had a PCN reaction occurring within the last 10 years: No If all of the above answers are "NO", then may proceed with Cephalosporin use.   REACTION: rash  . Ciprofloxacin     nausea  . Ibuprofen Rash    swelling in leg     Patient Measurements: Height: 4\' 9"  (144.8 cm) Weight: 93.5 kg (206 lb 1.6 oz) IBW/kg (Calculated) : 38.6  Vital Signs: Temp: 97.5 F (36.4 C) (08/18 0655) Temp Source: Oral (08/18 0655) BP: 115/64 (08/18 0655) Pulse Rate: 62 (08/18 1026)  Labs: Recent Labs    04/21/20 0302 04/22/20 0339 04/23/20 0642  LABPROT 19.2* 20.9* 20.9*  INR 1.7* 1.9* 1.9*  CREATININE 0.92 0.99 0.96    Estimated Creatinine Clearance: 64.1 mL/min (by C-G formula based on SCr of 0.96 mg/dL).   Medical History: Past Medical History:  Diagnosis Date  . Acute kidney failure with lesion of tubular necrosis (HCC)   . Acute systolic heart failure ([place])   . CAD (coronary artery disease)    a. s/p prior PCI. b. CABG 2007 at [facility] in [place] 2007. c. inferior STEMI 10/2015 s/p DES to dSVG-PDA.  . Cardiac arrest ([place])   . Cervical cancer ([employer])   . Chronic diastolic CHF (congestive heart failure) ([place])   . Chronic respiratory failure ([place])    s/p tracheostomy 2002  . Chronic RUQ pain   . COPD (chronic obstructive pulmonary disease) ([place])   . Diabetes mellitus ([place])   . DVT (deep venous thrombosis) ([place])   . Endometriosis   . History of gallstones 01/2016   seen on Ultrasound  . History of HIDA scan 11/2016   normal   . HTN (hypertension)   . Hyperlipidemia   . Lupus anticoagulant disorder (HCC)    on coumadin  . Morbid obesity ([place])   . ST elevation (STEMI) myocardial infarction involving right coronary artery ([place]) 10/29/15   stent to VG to PDA  . Toe fracture, right 03/29/2018  . Tracheostomy in place [facility]), chronic since 2002 11/03/2015  . Tracheostomy status [facility])     Assessment: 54 yo female admitted for acute on chronic CHF, to continue PTA warfarin for a history of VTE and lupus. PTA last dose of warfarin taken 8/12. PTA warfarin regimen of 2.5 mg per day except 5 mg on Mon, Wed, Fri.   INR is subtherapeutic at 1.9 - on enoxaparin bridging until therapeutic. CBC stable on last check 8/15.   Goal of Therapy:  INR 2-3   Plan:  Warfarin 5 mg PO tonight Enoxaparin 40 mg SubQ q24h Daily PT/INR   Theresa [name], PharmD, BCCCP Clinical Pharmacist  Phone: [phone] 04/23/2020 11:43 AM  Please check AMION for all [place] phone numbers After 10:00 PM, call [place] [phone]

## 2020-04-23 NOTE — Progress Notes (Signed)
Inpatient Diabetes Program Recommendations  AACE/ADA: New Consensus Statement on Inpatient Glycemic Control (2015)  Target Ranges:  Prepandial:   less than 140 mg/dL      Peak postprandial:   less than 180 mg/dL (1-2 hours)      Critically ill patients:  140 - 180 mg/dL   Lab Results  Component Value Date   GLUCAP 159 (H) 04/23/2020   HGBA1C 6.3 (H) 01/13/2019    Review of Glycemic Control Results for Theresa, [name] (MRN [id number]) as of 04/23/2020 11:43  Ref. Range 04/22/2020 16:26 04/22/2020 21:09 04/23/2020 07:50 04/23/2020 11:17  Glucose-Capillary Latest Ref Range: 70 - 99 mg/dL 212 (H) 308 (H) 117 (H) 159 (H)   Diabetes history: Type 2 DM Outpatient Diabetes medications: Tresiba 50 units QHS, Novolog 1-10 units TID, Ozempic 0.5 Qwk Current orders for Inpatient glycemic control: Lantus 42 units QHS, Novolog 0-9 units TID  Inpatient Diabetes Program Recommendations:    Consider adding Novolog 0-5 units QHS.    Thanks, Bronson Curb, MSN, RNC-OB Diabetes Coordinator [phone] (8a-5p)

## 2020-04-24 ENCOUNTER — Ambulatory Visit: Payer: Medicare Other | Admitting: Cardiology

## 2020-04-24 DIAGNOSIS — Z930 Tracheostomy status: Secondary | ICD-10-CM

## 2020-04-24 LAB — BASIC METABOLIC PANEL
Anion gap: 9 (ref 5–15)
BUN: 26 mg/dL — ABNORMAL HIGH (ref 6–20)
CO2: 37 mmol/L — ABNORMAL HIGH (ref 22–32)
Calcium: 9.2 mg/dL (ref 8.9–10.3)
Chloride: 92 mmol/L — ABNORMAL LOW (ref 98–111)
Creatinine, Ser: 1.02 mg/dL — ABNORMAL HIGH (ref 0.44–1.00)
GFR calc Af Amer: >60 mL/min (ref >60)
GFR calc non Af Amer: >60 mL/min (ref >60)
Glucose, Bld: 115 mg/dL — ABNORMAL HIGH (ref 70–99)
Potassium: 4.6 mmol/L (ref 3.5–5.1)
Sodium: 138 mmol/L (ref 135–145)

## 2020-04-24 LAB — GLUCOSE, CAPILLARY
Glucose-Capillary: 109 mg/dL — ABNORMAL HIGH (ref 70–99)
Glucose-Capillary: 193 mg/dL — ABNORMAL HIGH (ref 70–99)
Glucose-Capillary: 184 mg/dL — ABNORMAL HIGH (ref 70–99)
Glucose-Capillary: 299 mg/dL — ABNORMAL HIGH (ref 70–99)

## 2020-04-24 LAB — PROTIME-INR
INR: 1.9 — ABNORMAL HIGH (ref 0.8–1.2)
Prothrombin Time: 20.8 seconds — ABNORMAL HIGH (ref 11.4–15.2)

## 2020-04-24 LAB — MAGNESIUM: Magnesium: 2.3 mg/dL (ref 1.7–2.4)

## 2020-04-24 MED ORDER — WARFARIN SODIUM 5 MG PO TABS
5.0000 mg | ORAL_TABLET | Freq: Once | ORAL | Status: AC
  Administered 2020-04-24: 5 mg via ORAL
  Filled 2020-04-24: qty 1

## 2020-04-24 MED ORDER — TORSEMIDE 20 MG PO TABS
40.0000 mg | ORAL_TABLET | Freq: Two times a day (BID) | ORAL | Status: DC
  Administered 2020-04-24 – 2020-04-25 (×3): 40 mg via ORAL
  Filled 2020-04-24 (×3): qty 2

## 2020-04-24 NOTE — Progress Notes (Signed)
[place] for warfarin Indication: h/o VTE and Lupus  Allergies  Allergen Reactions  . Penicillins Rash    Has patient had a PCN reaction causing immediate rash, facial/tongue/throat swelling, SOB or lightheadedness with hypotension: Yes Has patient had a PCN reaction causing severe rash involving mucus membranes or skin necrosis: No Has patient had a PCN reaction that required hospitalization No Has patient had a PCN reaction occurring within the last 10 years: No If all of the above answers are "NO", then may proceed with Cephalosporin use.   REACTION: rash  . Ciprofloxacin     nausea  . Ibuprofen Rash    swelling in leg     Patient Measurements: Height: 4\' 9"  (144.8 cm) Weight: 92.8 kg (204 lb 9.6 oz) IBW/kg (Calculated) : 38.6  Vital Signs: Temp: 97.4 F (36.3 C) (08/19 0701) Temp Source: Oral (08/19 0701) BP: 107/70 (08/19 0701) Pulse Rate: 60 (08/19 0701)  Labs: Recent Labs    04/22/20 0339 04/23/20 0642 04/24/20 0408  LABPROT 20.9* 20.9* 20.8*  INR 1.9* 1.9* 1.9*  CREATININE 0.99 0.96 1.02*    Estimated Creatinine Clearance: 60 mL/min (A) (by C-G formula based on SCr of 1.02 mg/dL (H)).   Medical History: Past Medical History:  Diagnosis Date  . Acute kidney failure with lesion of tubular necrosis (HCC)   . Acute systolic heart failure ([place])   . CAD (coronary artery disease)    a. s/p prior PCI. b. CABG 2007 at [facility] in [place] 2007. c. inferior STEMI 10/2015 s/p DES to dSVG-PDA.  . Cardiac arrest ([place])   . Cervical cancer ([place])   . Chronic diastolic CHF (congestive heart failure) ([place])   . Chronic respiratory failure ([place])    s/p tracheostomy 2002  . Chronic RUQ pain   . COPD (chronic obstructive pulmonary disease) ([place])   . Diabetes mellitus ([place])   . DVT (deep venous thrombosis) ([place])   . Endometriosis   . History of gallstones 01/2016   seen on Ultrasound  . History of HIDA scan 11/2016    normal  . HTN (hypertension)   . Hyperlipidemia   . Lupus anticoagulant disorder (HCC)    on coumadin  . Morbid obesity ([place])   . ST elevation (STEMI) myocardial infarction involving right coronary artery ([place]) 10/29/15   stent to VG to PDA  . Toe fracture, right 03/29/2018  . Tracheostomy in place [facility]), chronic since 2002 11/03/2015  . Tracheostomy status [facility])     Assessment: 54 yo female admitted for acute on chronic CHF, to continue PTA warfarin for a history of VTE and lupus. PTA last dose of warfarin taken 8/12. PTA warfarin regimen of 2.5 mg per day except 5 mg on Mon, Wed, Fri.   INR remains subtherapeutic at 1.9, will give boosted dose tonight.  Goal of Therapy:  INR 2-3   Plan:  Warfarin 5 mg PO tonight Enoxaparin 40 mg SubQ q24h Daily PT/INR   Arrie Senate, PharmD, BCPS Clinical Pharmacist [phone] Please check AMION for all [place] numbers 04/24/2020

## 2020-04-24 NOTE — Progress Notes (Addendum)
[facility] PROGRESS NOTE    Progress Note  Theresa [name]  NTI:[id number] DOB: Jul 10, 1966 DOA: 04/17/2020 PCP: Monico Blitz, MD     Brief Narrative:   Theresa [name] is an 54 y.o. female past medical history of coronary artery disease status post CABG PCI chronic respiratory failure on a trach collar, chronic systolic heart failure DVT on Coumadin presents to the ED with shortness of breath and bilateral lower extremity edema.  CT angio of the chest was obtained and is negative for pulmonary embolism.  She was admitted and being treated for acute systolic heart failure.  Assessment/Plan:   Acute on chronic congestive heart failure ([place]): The myopathy with an EF of 40% in May 2020, she was started on Lasix IV twice a day cardiology was consulted who recommended to increase her Lasix to IV 3 times daily. Patient is currently negative about 7.5 L, and replete as needed.  There has been mild increase in her creatinine continue Lasix per cardiology recommendations. Hopefully we will change her to oral Lasix today and monitor for 24 hours.  Coronary artery disease status post CABG: Denies any chest pain.  Chronic respiratory failure status post trach:  Noted, continue pulmonary hygiene.  Essential hypertension: Blood pressure has remained stable she is not tachycardic, continue current regimen.  Controlled diabetes mellitus type 2: Continue long-acting insulin plus sliding scale.  History of lupus anticoagulant: Currently on Coumadin and Lovenox as her INR was subtherapeutic. Continue Coumadin and Lovenox will need to overlap them for at least 24 hours after her INR is greater than 2.  DVT prophylaxis: coumadin and lovenox Family Communication:non Status is: Inpatient  Remains inpatient appropriate because:Hemodynamically unstable   Dispo: The patient is from: Home              Anticipated d/c is to: Home              Anticipated d/c date is: 1 day               Patient currently is medically stable to d/c.        Code Status:     Code Status Orders  (From admission, onward)         Start     Ordered   04/18/20 0153  Full code  Continuous        04/18/20 0153        Code Status History    Date Active Date Inactive Code Status Order ID Comments User Context   05/07/2019 1650 05/17/2019 1750 Full Code [id number]  Norval Morton, MD ED   01/12/2019 2220 01/19/2019 1754 Full Code [id number]  Milus Banister, MD ED   10/19/2018 2303 11/22/2018 1804 Full Code [id number]  Maudry Diego Inpatient   10/15/2018 0053 10/19/2018 2222 Full Code [id number]  Scatliffe, Rise Paganini, MD ED   03/22/2018 1626 03/29/2018 2115 Full Code [id number]  Phillips Grout, MD ED   07/31/2017 2129 08/04/2017 1915 Full Code [id number]  Huneycutt, Tressie Ellis, MD Inpatient   01/22/2016 2340 01/27/2016 1314 Full Code [id number]  Rise Patience, MD Inpatient   Advance Care Planning Activity        IV Access:    Peripheral IV   Procedures and diagnostic studies:   No results found.   Medical Consultants:    None.  Anti-Infectives:   none  Subjective:    Theresa [name] no complaints.  Objective:    Vitals:   04/23/20 1645 04/23/20 2010  04/23/20 2200 04/24/20 0701  BP:   119/60 107/70  Pulse:   62 60  Resp: 18 18 18 17   Temp:   97.8 F (36.6 C) (!) 97.4 F (36.3 C)  TempSrc:   Oral Oral  SpO2:   97%   Weight:    92.8 kg  Height:       SpO2: 97 % O2 Flow Rate (L/min): 6 L/min FiO2 (%): 93 %   Intake/Output Summary (Last 24 hours) at 04/24/2020 0805 Last data filed at 04/24/2020 0700 Gross per 24 hour  Intake 720 ml  Output 1800 ml  Net -1080 ml   Filed Weights   04/22/20 0402 04/23/20 0655 04/24/20 0701  Weight: 95.6 kg 93.5 kg 92.8 kg    Exam: General exam: In no acute distress. Respiratory system: Good air movement and clear to auscultation. Cardiovascular system: S1 & S2 heard, RRR. No JVD. Gastrointestinal system:  Abdomen is nondistended, soft and nontender.  Extremities: No pedal edema. Skin: No rashes, lesions or ulcers Psychiatry: Judgement and insight appear normal. Mood & affect appropriate.    Data Reviewed:    Labs: Basic Metabolic Panel: Recent Labs  Lab 04/20/20 0932 04/20/20 0932 04/21/20 0302 04/21/20 0302 04/22/20 3546 04/22/20 0339 04/23/20 0642 04/24/20 0408  NA 144  --  141  --  140  --  140 138  K 3.8   < > 3.9   < > 5.0   < > 4.1 4.6  CL 95*  --  93*  --  93*  --  94* 92*  CO2 38*  --  38*  --  39*  --  37* 37*  GLUCOSE 61*  --  136*  --  198*  --  124* 115*  BUN 15  --  16  --  19  --  22* 26*  CREATININE 0.89  --  0.92  --  0.99  --  0.96 1.02*  CALCIUM 8.8*  --  8.8*  --  9.3  --  9.1 9.2  MG  --   --   --   --   --   --   --  2.3   < > = values in this interval not displayed.   GFR Estimated Creatinine Clearance: 60 mL/min (A) (by C-G formula based on SCr of 1.02 mg/dL (H)). Liver Function Tests: No results for input(s): AST, ALT, ALKPHOS, BILITOT, PROT, ALBUMIN in the last 168 hours. No results for input(s): LIPASE, AMYLASE in the last 168 hours. No results for input(s): AMMONIA in the last 168 hours. Coagulation profile Recent Labs  Lab 04/20/20 0932 04/21/20 0302 04/22/20 0339 04/23/20 0642 04/24/20 0408  INR 1.5* 1.7* 1.9* 1.9* 1.9*   COVID-19 Labs  No results for input(s): DDIMER, FERRITIN, LDH, CRP in the last 72 hours.  Lab Results  Component Value Date   SARSCOV2NAA NEGATIVE 04/17/2020   SARSCOV2NAA Detected (A) 09/06/2019   [place] NEGATIVE 05/07/2019    CBC: Recent Labs  Lab 04/17/20 1917 04/18/20 0510 04/20/20 0932  WBC 8.7 8.4 10.4  HGB 12.0 12.1 12.1  HCT 38.3 37.7 39.6  MCV 98.0 95.9 99.5  PLT 237 209 296   Cardiac Enzymes: No results for input(s): CKTOTAL, CKMB, CKMBINDEX, TROPONINI in the last 168 hours. BNP (last 3 results) No results for input(s): PROBNP in the last 8760 hours. CBG: Recent Labs  Lab  04/22/20 2109 04/23/20 0750 04/23/20 1117 04/23/20 1600 04/23/20 2059  GLUCAP 308* 117* 159* 193* 287*  D-Dimer: No results for input(s): DDIMER in the last 72 hours. Hgb A1c: No results for input(s): HGBA1C in the last 72 hours. Lipid Profile: No results for input(s): CHOL, HDL, LDLCALC, TRIG, CHOLHDL, LDLDIRECT in the last 72 hours. Thyroid function studies: No results for input(s): TSH, T4TOTAL, T3FREE, THYROIDAB in the last 72 hours.  Invalid input(s): FREET3 Anemia work up: No results for input(s): VITAMINB12, FOLATE, FERRITIN, TIBC, IRON, RETICCTPCT in the last 72 hours. Sepsis Labs: Recent Labs  Lab 04/17/20 1917 04/18/20 0510 04/20/20 0932  WBC 8.7 8.4 10.4   Microbiology Recent Results (from the past 240 hour(s))  SARS Coronavirus 2 by RT PCR (hospital order, performed in [facility] hospital lab) Nasopharyngeal Nasopharyngeal Swab     Status: None   Collection Time: 04/17/20 10:10 PM   Specimen: Nasopharyngeal Swab  Result Value Ref Range Status   SARS Coronavirus 2 NEGATIVE NEGATIVE Final    Comment: (NOTE) SARS-CoV-2 target nucleic acids are NOT DETECTED.  The SARS-CoV-2 RNA is generally detectable in upper and lower respiratory specimens during the acute phase of infection. The lowest concentration of SARS-CoV-2 viral copies this assay can detect is 250 copies / mL. A negative result does not preclude SARS-CoV-2 infection and should not be used as the sole basis for treatment or other patient management decisions.  A negative result may occur with improper specimen collection / handling, submission of specimen other than nasopharyngeal swab, presence of viral mutation(s) within the areas targeted by this assay, and inadequate number of viral copies (<250 copies / mL). A negative result must be combined with clinical observations, patient history, and epidemiological information.  Fact Sheet for Patients:    StrictlyIdeas.no  Fact Sheet for Healthcare Providers: BankingDealers.co.za  This test is not yet approved or  cleared by the [place] FDA and has been authorized for detection and/or diagnosis of SARS-CoV-2 by FDA under an Emergency Use Authorization (EUA).  This EUA will remain in effect (meaning this test can be used) for the duration of the COVID-19 declaration under Section 564(b)(1) of the Act, 21 U.S.C. section 360bbb-3(b)(1), unless the authorization is terminated or revoked sooner.  Performed at [facility], [place] [address] [place], [place] [address]      Medications:   . atorvastatin  20 mg Oral Daily  . chlorhexidine  15 mL Mouth Rinse BID  . enoxaparin (LOVENOX) injection  40 mg Subcutaneous Q24H  . furosemide  80 mg Intravenous TID  . gabapentin  900 mg Oral QHS  . insulin aspart  0-9 Units Subcutaneous TID WC  . insulin glargine  42 Units Subcutaneous QHS  . losartan  25 mg Oral Daily  . mouth rinse  15 mL Mouth Rinse q12n4p  . metoprolol succinate  50 mg Oral Daily  . pantoprazole  20 mg Oral Daily  . potassium chloride SA  20 mEq Oral Daily  . sertraline  50 mg Oral QHS  . tamsulosin  0.4 mg Oral Daily  . Warfarin - Pharmacist Dosing Inpatient   Does not apply q1600   Continuous Infusions:    LOS: 7 days   Charlynne Cousins  [facility]  04/24/2020, 8:05 AM

## 2020-04-24 NOTE — Progress Notes (Signed)
Progress Note  Patient Name: Theresa [name] Date of Encounter: 04/24/2020  [facility] Cardiologist: [name], MD   Subjective   No CP; dyspnea improved  Inpatient Medications    Scheduled Meds: . atorvastatin  20 mg Oral Daily  . chlorhexidine  15 mL Mouth Rinse BID  . enoxaparin (LOVENOX) injection  40 mg Subcutaneous Q24H  . furosemide  80 mg Intravenous TID  . gabapentin  900 mg Oral QHS  . insulin aspart  0-9 Units Subcutaneous TID WC  . insulin glargine  42 Units Subcutaneous QHS  . losartan  25 mg Oral Daily  . mouth rinse  15 mL Mouth Rinse q12n4p  . metoprolol succinate  50 mg Oral Daily  . pantoprazole  20 mg Oral Daily  . potassium chloride SA  20 mEq Oral Daily  . sertraline  50 mg Oral QHS  . tamsulosin  0.4 mg Oral Daily  . Warfarin - Pharmacist Dosing Inpatient   Does not apply q1600   Continuous Infusions:  PRN Meds: albuterol, LORazepam, nitroGLYCERIN, ondansetron **OR** ondansetron (ZOFRAN) IV, traMADol   Vital Signs    Vitals:   04/23/20 1645 04/23/20 2010 04/23/20 2200 04/24/20 0701  BP:   119/60 107/70  Pulse:   62 60  Resp: 18 18 18 17   Temp:   97.8 F (36.6 C) (!) 97.4 F (36.3 C)  TempSrc:   Oral Oral  SpO2:   97%   Weight:    92.8 kg  Height:        Intake/Output Summary (Last 24 hours) at 04/24/2020 0834 Last data filed at 04/24/2020 0700 Gross per 24 hour  Intake 720 ml  Output 1800 ml  Net -1080 ml   Last 3 Weights 04/24/2020 04/23/2020 04/22/2020  Weight (lbs) 204 lb 9.6 oz 206 lb 1.6 oz 210 lb 10.8 oz  Weight (kg) 92.806 kg 93.486 kg 95.56 kg      Telemetry    Sinus - Personally Reviewed   Physical Exam   GEN: No acute distress. Obese   Neck: No JVD Cardiac: RRR, no murmurs, rubs, or gallops.  Respiratory: Clear to auscultation bilaterally. GI: Soft, nontender, non-distended  MS: No edema Neuro:  Nonfocal  Psych: Normal affect   Labs    High Sensitivity Troponin:   Recent Labs  Lab 04/17/20 1917  04/17/20 2148  TROPONINIHS 9 10      Chemistry Recent Labs  Lab 04/22/20 0339 04/23/20 0642 04/24/20 0408  NA 140 140 138  K 5.0 4.1 4.6  CL 93* 94* 92*  CO2 39* 37* 37*  GLUCOSE 198* 124* 115*  BUN 19 22* 26*  CREATININE 0.99 0.96 1.02*  CALCIUM 9.3 9.1 9.2  GFRNONAA >60 >60 >60  GFRAA >60 >60 >60  ANIONGAP 8 9 9      Hematology Recent Labs  Lab 04/17/20 1917 04/18/20 0510 04/20/20 0932  WBC 8.7 8.4 10.4  RBC 3.91 3.93 3.98  HGB 12.0 12.1 12.1  HCT 38.3 37.7 39.6  MCV 98.0 95.9 99.5  MCH 30.7 30.8 30.4  MCHC 31.3 32.1 30.6  RDW 13.6 13.5 14.0  PLT 237 209 296    BNP Recent Labs  Lab 04/17/20 1923  BNP 550.3*     DDimer  Recent Labs  Lab 04/18/20 0510  DDIMER 0.62*     Patient Profile     54 y.o.femalewith a history ofwith a history of CAD with CABG in 2007 and subsequent inferior STEMI in 10/2015 s/p DES to dSVG-PDA  graft, chronic combined CHF, COPD with chronic respiratory failure s/p trach in 2002, lupus anticoagulant disorder on chronic Coumadin, hypertension, hyperlipidemia, diabetes mellitus, and morbid obesity with BMI of 49.8 who was admitted on 04/17/2020 for acute on chronic combined CHF for which Cardiology was consulted.  Echocardiogram this admission shows ejection fraction 40 to 44%, grade 3 diastolic dysfunction, mild biatrial enlargement, mild mitral regurgitation.  Assessment & Plan    1 Acute on chronic combined systolic/diastolic congestive heart failure-[id number]/O-7342 since admission. Wt 92.8 kg.  Patient much improved since admission.  Discontinue IV Lasix.  Would treat with Demadex 40 mg twice daily at home.  We will check potassium and renal function 1 week following discharge.  Needs fluid restriction and low-sodium diet.  Continue low-dose ARB and beta-blocker.  2 coronary artery disease status post coronary artery bypass and graft-continue statin.  She is not on aspirin given need for Coumadin.  3 hypercoagulable state-would  discharge on preadmission dose of Coumadin.  Needs follow-up in Coumadin clinic.  4 COPD-status post tracheostomy-Per primary care.  Patient can be discharged from a cardiac standpoint on present medications.  We will arrange transition of care appointment in 1 week.  Follow-up Dr. Percival Spanish 3 months.  Check bmet 1 week.  Follow-up in Coumadin clinic in 2 to 4 weeks.   For questions or updates, please contact [place] Please consult [email or web address] for contact info under        Signed, Kirk Ruths, MD  04/24/2020, 8:34 AM

## 2020-04-25 LAB — GLUCOSE, CAPILLARY: Glucose-Capillary: 182 mg/dL — ABNORMAL HIGH (ref 70–99)

## 2020-04-25 LAB — PROTIME-INR
INR: 2 — ABNORMAL HIGH (ref 0.8–1.2)
Prothrombin Time: 21.7 seconds — ABNORMAL HIGH (ref 11.4–15.2)

## 2020-04-25 MED ORDER — TORSEMIDE 20 MG PO TABS: 40.0000 mg | ORAL_TABLET | Freq: Two times a day (BID) | ORAL | 3 refills | Status: DC

## 2020-04-25 NOTE — TOC Transition Note (Signed)
Transition of Care (TOC) - CM/SW Discharge Note Marvetta Gibbons RN, BSN Transitions of Care Unit 4E- RN Case Manager See Treatment Team for direct phone # Cross Coverage for [place]   Patient Details  Name: Theresa [name] MRN: [id number] Date of Birth: 13-Aug-1966  Transition of Care [facility]) CM/SW Contact:  Dawayne Patricia, RN Phone Number: 04/25/2020, 2:29 PM   Clinical Narrative:    Pt stable for transition home today, HHRN/PT has been arranged with [facility] per pt choice- no further TOC noted. Family to transport home.    Final next level of care: [place] Barriers to Discharge: Barriers Resolved   Patient Goals and CMS Choice Patient states their goals for this hospitalization and ongoing recovery are:: be able to feel better and breathe better CMS Medicare.gov Compare Post Acute Care list provided to:: Patient Choice offered to / list presented to : Patient  Discharge Placement               Home with [facility]        Discharge Plan and Services   Discharge Planning Services: CM Consult Post Acute Care Choice: Home Health          DME Arranged: N/A DME Agency: NA       HH Arranged: PT, RN [place] Agency: [place] ([facility]) Date HH Agency Contacted: 04/23/20 Time [place]: [id number] Representative spoke with at [place]: [place] ([place]) Interventions     Readmission Risk Interventions Readmission Risk Prevention Plan 05/17/2019  Transportation Screening Complete  PCP or Specialist Appt within 3-5 Days Complete  HRI or [place] Complete  Social Work Consult for [place] Planning/Counseling Complete  Palliative Care Screening Not Applicable  Medication Review Press photographer) Complete  Some recent data might be hidden

## 2020-04-25 NOTE — Discharge Summary (Addendum)
Physician Discharge Summary  Theresa [name] UXN:[id number] DOB: October 31, 1965 DOA: 04/17/2020  PCP: Monico Blitz, MD  Admit date: 04/17/2020 Discharge date: 05/02/2020  Admitted From: Home Disposition:  Home  Recommendations for Outpatient Follow-up:  Follow up with Cards in 1-2 weeks Please obtain BMP/CBC in one week   Home Health:No Equipment/Devices:None  Discharge Condition:Stable CODE STATUS:Full Diet recommendation: Heart Healthy  Brief/Interim Summary: 54 y.o. female past medical history of coronary artery disease status post CABG PCI chronic respiratory failure on a trach collar, chronic systolic heart failure DVT on Coumadin presents to the ED with shortness of breath and bilateral lower extremity edema.  CT angio of the chest was obtained and is negative for pulmonary embolism.  She was admitted and being treated for acute systolic heart failure.    Discharge Diagnoses:  Principal Problem:   Acute on chronic congestive heart failure (HCC) Active Problems:   Essential hypertension   CAD, NATIVE VESSEL   Acute thromboembolism of deep veins of lower extremity (HCC)   Lupus anticoagulant syndrome (HCC)   Tracheostomy in place [facility]), chronic since 2002   Acute respiratory failure with hypoxia ([place])   Type 2 diabetes mellitus with hyperglycemia (HCC)   CHF (congestive heart failure) (HCC)  Acute respiratory failure with hypoxia secondarily to acute on chronic systolic heart failure: With an EF of 40% in May 2020 he was started on IV Lasix cardiology was consulted who increase to 3 times a day she diuresed about 7-1/2 L well her hospital stay electrolytes were repleted as needed she was transitioned to oral torsemide twice a day she was monitored for 24 hours and she remained euvolemic and about the same amount negative. She will follow up with cardiology in 1 week.  CAD status post CABG: Denies any chest pain.  Chronic respiratory failure status post trach: Noted continue  pulmonary hygiene.  Essential hypertension: No changes made to medication  Controlled diabetes mellitus type 2 blood Continue sliding scale insulin.  History of lupus anticoagulant: Her INR was therapeutic so she will relapse with Lovenox and Coumadin, on the day of discharge her INR was 0.2 no changes made to her Coumadin medication.   Discharge Instructions  Discharge Instructions     Diet - low sodium heart healthy   Complete by: As directed    Increase activity slowly   Complete by: As directed       Allergies as of 04/25/2020       Reactions   Penicillins Rash   Has patient had a PCN reaction causing immediate rash, facial/tongue/throat swelling, SOB or lightheadedness with hypotension: Yes Has patient had a PCN reaction causing severe rash involving mucus membranes or skin necrosis: No Has patient had a PCN reaction that required hospitalization No Has patient had a PCN reaction occurring within the last 10 years: No If all of the above answers are "NO", then may proceed with Cephalosporin use. REACTION: rash   Ciprofloxacin    nausea   Ibuprofen Rash   swelling in leg        Medication List     TAKE these medications    albuterol 108 (90 Base) MCG/ACT inhaler Commonly known as: VENTOLIN HFA Inhale 2 puffs into the lungs every 4 (four) hours as needed for wheezing or shortness of breath. What changed: Another medication with the same name was changed. Make sure you understand how and when to take each.   albuterol (2.5 MG/3ML) 0.083% nebulizer solution Commonly known as: PROVENTIL Take 3 mLs (  2.5 mg total) by nebulization 2 (two) times daily. What changed:  when to take this reasons to take this   atorvastatin 20 MG tablet Commonly known as: Lipitor Take 1 tablet (20 mg total) by mouth daily.   gabapentin 300 MG capsule Commonly known as: NEURONTIN Take 1 capsule (300mg ) in the morning, 1 capsule (300mg ) in the afternoon, and 2 capsules (600mg ) at  bedtime What changed:  how much to take how to take this when to take this additional instructions   LORazepam 0.5 MG tablet Commonly known as: ATIVAN Take 0.5 mg by mouth every 6 (six) hours as needed for anxiety.   losartan 25 MG tablet Commonly known as: COZAAR Take 1 tablet (25 mg total) by mouth daily.   metFORMIN 500 MG tablet Commonly known as: GLUCOPHAGE Take 500 mg by mouth every morning.   metoprolol succinate 50 MG 24 hr tablet Commonly known as: TOPROL-XL Take 1 tablet (50 mg total) by mouth daily.   nitroGLYCERIN 0.4 MG SL tablet Commonly known as: NITROSTAT Place 0.4 mg under the tongue every 5 (five) minutes as needed for chest pain.   NovoLOG FlexPen 100 UNIT/ML FlexPen Generic drug: insulin aspart Inject 1-10 Units into the skin 3 (three) times daily before meals.   Ozempic (0.25 or 0.5 MG/DOSE) 2 MG/1.5ML Sopn Generic drug: Semaglutide(0.25 or 0.5MG /DOS) Inject 0.5 mg into the skin every Wednesday.   pantoprazole 20 MG tablet Commonly known as: PROTONIX Take 20 mg by mouth daily.   potassium chloride SA 20 MEQ tablet Commonly known as: KLOR-CON Take 20 mEq by mouth daily.   sertraline 50 MG tablet Commonly known as: ZOLOFT Take 50 mg by mouth at bedtime.   tamsulosin 0.4 MG Caps capsule Commonly known as: FLOMAX Take 0.4 mg by mouth daily.   torsemide 20 MG tablet Commonly known as: DEMADEX Take 2 tablets (40 mg total) by mouth 2 (two) times daily. What changed:  how much to take when to take this additional instructions   traMADol 50 MG tablet Commonly known as: ULTRAM Take 50 mg by mouth 3 (three) times daily as needed.   Tyler Aas FlexTouch 100 UNIT/ML FlexTouch Pen Generic drug: insulin degludec Inject 50 Units into the skin at bedtime.   warfarin 2.5 MG tablet Commonly known as: COUMADIN Take 2 tablets (5 mg) tonight at 6 PM.  Go to your PCP for INR tomorrow. What changed:  how much to take how to take this when to take  this additional instructions        Follow-up Information     [facility].   Specialty: [place] Why: HHRN/PT arranged- they will contact you post discharge to set up home visits        Darreld Mclean, PA-C Follow up on 05/05/2020.   Specialties: Librarian, academic, Cardiology Why: Please arrive 15 minutes early for your 8:45am post-hospital cardiology follow-up appointment Contact information: [address] [place] [address] [phone]                Allergies  Allergen Reactions   Penicillins Rash    Has patient had a PCN reaction causing immediate rash, facial/tongue/throat swelling, SOB or lightheadedness with hypotension: Yes Has patient had a PCN reaction causing severe rash involving mucus membranes or skin necrosis: No Has patient had a PCN reaction that required hospitalization No Has patient had a PCN reaction occurring within the last 10 years: No If all of the above answers  are "NO", then may proceed with Cephalosporin use.   REACTION: rash   Ciprofloxacin     nausea   Ibuprofen Rash    swelling in leg     Consultations: Cardiology   Procedures/Studies: DG Chest 2 View  Result Date: 04/17/2020 CLINICAL DATA:  Dyspnea EXAM: CHEST - 2 VIEW COMPARISON:  01/15/2020 FINDINGS: Pulmonary insufflation remain stable since prior examination. Surgical staple lines related to partial right lung resection noted at the right lung base. There is perihilar and lower lung zone interstitial pulmonary infiltrate again identified, similar to prior examination, most in keeping with mild interstitial pulmonary edema, likely cardiogenic in nature. No pneumothorax or pleural effusion. Coronary artery bypass grafting has been performed. Mild to moderate cardiomegaly is stable. Tracheostomy is unchanged in position. Central pulmonary arterial enlargement is again noted, unchanged likely related to underlying  pulmonary hypertension. No acute bone abnormality. IMPRESSION: Mild CHF, unchanged from prior exam Electronically Signed   By: Fidela Salisbury MD   On: 04/17/2020 19:52   CT ANGIO CHEST PE W OR WO CONTRAST  Result Date: 04/18/2020 CLINICAL DATA:  Shortness of breath EXAM: CT ANGIOGRAPHY CHEST WITH CONTRAST TECHNIQUE: Multidetector CT imaging of the chest was performed using the standard protocol during bolus administration of intravenous contrast. Multiplanar CT image reconstructions and MIPs were obtained to evaluate the vascular anatomy. CONTRAST:  71mL OMNIPAQUE IOHEXOL 350 MG/ML SOLN COMPARISON:  Chest x-ray from the previous day. FINDINGS: Cardiovascular: Atherosclerotic calcifications of the thoracic aorta are noted. Changes of coronary bypass grafting are noted and stable. The heart is mildly enlarged. No pericardial effusion is seen. Coronary calcifications are noted. The pulmonary artery is well visualized with a normal branching pattern. No focal filling defect to suggest pulmonary embolism is seen. Mediastinum/Nodes: Thoracic inlet is within normal limits with the exception of a tracheostomy tube in satisfactory position. Scattered hilar and mediastinal lymph nodes are noted likely reactive in nature. The esophagus as visualized is within normal limits. Lungs/Pleura: Ground-glass opacities are noted throughout the lungs bilaterally likely related to the known COVID-19 infection. Additionally a vascular congestion and edema is noted in the superimposed fashion. Postsurgical changes in the right lung base are noted and stable. Upper Abdomen: Visualized upper abdomen shows a stable left adrenal lesion consistent with an adenoma similar to that seen on a prior CT from 2020. Cholelithiasis is noted and stable. Musculoskeletal: Degenerative changes of the thoracic spine are noted. No acute bony abnormality is seen. Stable compression deformity is seen at T9 Review of the MIP images confirms the above  findings. IMPRESSION: No evidence of pulmonary emboli. Diffuse edema is identified consistent with a degree of congestive failure. Additionally ground-glass opacities are noted likely related to sequelae from prior COVID-19 infection Stable left adrenal lesion consistent with adenoma. Cholelithiasis without complicating factors. Electronically Signed   By: Inez Catalina M.D.   On: 04/18/2020 09:39   ECHOCARDIOGRAM COMPLETE  Result Date: 04/20/2020    ECHOCARDIOGRAM REPORT   Patient Name:   Theresa [name] Date of Exam: 04/20/2020 Medical Rec #:  [id number]       Height:       57.0 in Accession #:    [id number]      Weight:       218.9 lb Date of Birth:  Mar 11, 1966       BSA:          1.869 m Patient Age:    54 years        BP:  98/77 mmHg Patient Gender: F               HR:           68 bpm. Exam Location:  Inpatient Procedure: 2D Echo, Cardiac Doppler, Color Doppler and Intracardiac            Opacification Agent Indications:    Dyspnea 786.09 / R06.00  History:        Patient has prior history of Echocardiogram examinations, most                 recent 01/13/2019. CHF, CAD, Arrythmias:STEMI; Risk                 Factors:Hypertension, Diabetes, Dyslipidemia and Current Smoker.  Sonographer:    Vickie Epley RDCS Referring Phys: [place]  Sonographer Comments: Patient is morbidly obese and Technically difficult study due to poor echo windows. Image acquisition challenging due to patient body habitus. IMPRESSIONS  1. Left ventricular ejection fraction, by estimation, is 40 to 45%. The left ventricle has mildly decreased function. The left ventricle demonstrates regional wall motion abnormalities (see scoring diagram/findings for description). Left ventricular diastolic parameters are consistent with Grade III diastolic dysfunction (restrictive). Elevated left atrial pressure.  2. Right ventricular systolic function is normal. The right ventricular size is normal. There is mildly elevated pulmonary  artery systolic pressure. The estimated right ventricular systolic pressure is 56.2 mmHg.  3. Left atrial size was mildly dilated.  4. Right atrial size was mildly dilated.  5. The mitral valve is degenerative. Mild mitral valve regurgitation.  6. The aortic valve is tricuspid. Aortic valve regurgitation is not visualized. Mild aortic valve sclerosis is present, with no evidence of aortic valve stenosis.  7. The inferior vena cava is dilated in size with <50% respiratory variability, suggesting right atrial pressure of 15 mmHg. FINDINGS  Left Ventricle: Left ventricular ejection fraction, by estimation, is 40 to 45%. The left ventricle has mildly decreased function. The left ventricle demonstrates regional wall motion abnormalities. Definity contrast agent was given IV to delineate the left ventricular endocardial borders. The left ventricular internal cavity size was normal in size. There is no left ventricular hypertrophy. Abnormal (paradoxical) septal motion consistent with post-operative status. Left ventricular diastolic parameters are consistent with Grade III diastolic dysfunction (restrictive). Elevated left atrial pressure.  LV Wall Scoring: The mid and distal anterior septum, basal inferolateral segment, basal inferior segment, and apex are hypokinetic. Right Ventricle: The right ventricular size is normal. No increase in right ventricular wall thickness. Right ventricular systolic function is normal. There is mildly elevated pulmonary artery systolic pressure. The tricuspid regurgitant velocity is 2.36  m/s, and with an assumed right atrial pressure of 15 mmHg, the estimated right ventricular systolic pressure is 13.0 mmHg. Left Atrium: Left atrial size was mildly dilated. Right Atrium: Right atrial size was mildly dilated. Pericardium: Trivial pericardial effusion is present. Mitral Valve: The mitral valve is degenerative in appearance. There is mild calcification of the anterior and posterior mitral  valve leaflet(s). Mild mitral valve regurgitation. Tricuspid Valve: The tricuspid valve is grossly normal. Tricuspid valve regurgitation is trivial. No evidence of tricuspid stenosis. Aortic Valve: The aortic valve is tricuspid. . There is mild thickening and mild calcification of the aortic valve. Aortic valve regurgitation is not visualized. Mild aortic valve sclerosis is present, with no evidence of aortic valve stenosis. There is mild thickening of the aortic valve. There is mild calcification of the aortic valve. Pulmonic Valve: The  pulmonic valve was grossly normal. Pulmonic valve regurgitation is mild. No evidence of pulmonic stenosis. Aorta: The aortic root is normal in size and structure. Venous: The inferior vena cava is dilated in size with less than 50% respiratory variability, suggesting right atrial pressure of 15 mmHg. IAS/Shunts: The atrial septum is grossly normal.  LEFT VENTRICLE PLAX 2D LVIDd:         4.80 cm     Diastology LVIDs:         3.90 cm     LV e' lateral:   4.70 cm/s LV PW:         0.90 cm     LV E/e' lateral: 25.3 LV IVS:        0.90 cm     LV e' medial:    3.16 cm/s LVOT diam:     1.70 cm     LV E/e' medial:  37.7 LV SV:         35 LV SV Index:   19 LVOT Area:     2.27 cm  LV Volumes (MOD) LV vol d, MOD A4C: 59.4 ml LV vol s, MOD A4C: 32.2 ml LV SV MOD A4C:     59.4 ml RIGHT VENTRICLE RV S prime:     5.93 cm/s TAPSE (M-mode): 1.2 cm LEFT ATRIUM             Index       RIGHT ATRIUM           Index LA diam:        5.10 cm 2.73 cm/m  RA Area:     21.50 cm LA Vol (A2C):   46.8 ml 25.04 ml/m RA Volume:   65.20 ml  34.88 ml/m LA Vol (A4C):   49.4 ml 26.43 ml/m LA Biplane Vol: 47.9 ml 25.63 ml/m  AORTIC VALVE LVOT Vmax:   90.20 cm/s LVOT Vmean:  52.500 cm/s LVOT VTI:    0.154 m  AORTA Ao Root diam: 2.80 cm MITRAL VALVE                TRICUSPID VALVE MV Area (PHT): 4.71 cm     TR Peak grad:   22.3 mmHg MV Decel Time: 161 msec     TR Vmax:        236.00 cm/s MR Peak grad: 74.0 mmHg MR  Vmax:      430.00 cm/s   SHUNTS MV E velocity: 119.00 cm/s  Systemic VTI:  0.15 m MV A velocity: 36.40 cm/s   Systemic Diam: 1.70 cm MV E/A ratio:  3.27 Eleonore Chiquito MD Electronically signed by Eleonore Chiquito MD Signature Date/Time: 04/20/2020/10:46:03 AM    Final     None  Subjective: No complaints.  Discharge Exam: Vitals:   04/25/20 0643 04/25/20 0804  BP: (!) 101/55   Pulse: (!) 57 62  Resp: 18 18  Temp: 97.6 F (36.4 C)   SpO2: 95% 95%   Vitals:   04/24/20 2106 04/25/20 0234 04/25/20 0643 04/25/20 0804  BP: (!) 106/58  (!) 101/55   Pulse: 63  (!) 57 62  Resp:   18 18  Temp: 98.3 F (36.8 C)  97.6 F (36.4 C)   TempSrc: Oral  Oral   SpO2: 94% 97% 95% 95%  Weight:   93.4 kg   Height:        General: Pt is alert, awake, not in acute distress Cardiovascular: RRR, S1/S2 +, no rubs, no gallops Respiratory: CTA bilaterally, no wheezing,  no rhonchi Abdominal: Soft, NT, ND, bowel sounds + Extremities: no edema, no cyanosis    The results of significant diagnostics from this hospitalization (including imaging, microbiology, ancillary and laboratory) are listed below for reference.     Microbiology: No results found for this or any previous visit (from the past 240 hour(s)).    Labs: BNP (last 3 results) Recent Labs    05/07/19 1129 04/17/20 1923  BNP 2,264.5* 824.2*   Basic Metabolic Panel: No results for input(s): NA, K, CL, CO2, GLUCOSE, BUN, CREATININE, CALCIUM, MG, PHOS in the last 168 hours.  Liver Function Tests: No results for input(s): AST, ALT, ALKPHOS, BILITOT, PROT, ALBUMIN in the last 168 hours. No results for input(s): LIPASE, AMYLASE in the last 168 hours. No results for input(s): AMMONIA in the last 168 hours. CBC: No results for input(s): WBC, NEUTROABS, HGB, HCT, MCV, PLT in the last 168 hours.  Cardiac Enzymes: No results for input(s): CKTOTAL, CKMB, CKMBINDEX, TROPONINI in the last 168 hours. BNP: Invalid input(s): POCBNP CBG: No  results for input(s): GLUCAP in the last 168 hours.  D-Dimer No results for input(s): DDIMER in the last 72 hours. Hgb A1c No results for input(s): HGBA1C in the last 72 hours. Lipid Profile No results for input(s): CHOL, HDL, LDLCALC, TRIG, CHOLHDL, LDLDIRECT in the last 72 hours. Thyroid function studies No results for input(s): TSH, T4TOTAL, T3FREE, THYROIDAB in the last 72 hours.  Invalid input(s): FREET3 Anemia work up No results for input(s): VITAMINB12, FOLATE, FERRITIN, TIBC, IRON, RETICCTPCT in the last 72 hours. Urinalysis    Component Value Date/Time   COLORURINE AMBER (A) 05/08/2019 0405   APPEARANCEUR HAZY (A) 05/08/2019 0405   LABSPEC 1.040 (H) 05/08/2019 0405   PHURINE 5.0 05/08/2019 0405   GLUCOSEU 50 (A) 05/08/2019 0405   HGBUR NEGATIVE 05/08/2019 0405   BILIRUBINUR SMALL (A) 05/08/2019 0405   KETONESUR 5 (A) 05/08/2019 0405   PROTEINUR >=300 (A) 05/08/2019 0405   NITRITE NEGATIVE 05/08/2019 0405   LEUKOCYTESUR NEGATIVE 05/08/2019 0405   Sepsis Labs Invalid input(s): PROCALCITONIN,  WBC,  LACTICIDVEN Microbiology No results found for this or any previous visit (from the past 240 hour(s)).    Time coordinating discharge: Over 30 minutes  SIGNED:   Charlynne Cousins, MD  [facility] 05/02/2020, 9:57 AM Pager   If 7PM-7AM, please contact night-coverage [email or web address] Password [email or web address]

## 2020-04-25 NOTE — Progress Notes (Signed)
[place] for warfarin Indication: h/o VTE and Lupus  Allergies  Allergen Reactions  . Penicillins Rash    Has patient had a PCN reaction causing immediate rash, facial/tongue/throat swelling, SOB or lightheadedness with hypotension: Yes Has patient had a PCN reaction causing severe rash involving mucus membranes or skin necrosis: No Has patient had a PCN reaction that required hospitalization No Has patient had a PCN reaction occurring within the last 10 years: No If all of the above answers are "NO", then may proceed with Cephalosporin use.   REACTION: rash  . Ciprofloxacin     nausea  . Ibuprofen Rash    swelling in leg     Patient Measurements: Height: 4\' 9"  (144.8 cm) Weight: 93.4 kg (206 lb) IBW/kg (Calculated) : 38.6  Vital Signs: Temp: 97.6 F (36.4 C) (08/20 0643) Temp Source: Oral (08/20 0643) BP: 101/55 (08/20 0643) Pulse Rate: 62 (08/20 0804)  Labs: Recent Labs    04/23/20 0642 04/24/20 0408 04/25/20 0435  LABPROT 20.9* 20.8* 21.7*  INR 1.9* 1.9* 2.0*  CREATININE 0.96 1.02*  --     Estimated Creatinine Clearance: 60.2 mL/min (A) (by C-G formula based on SCr of 1.02 mg/dL (H)).   Medical History: Past Medical History:  Diagnosis Date  . Acute kidney failure with lesion of tubular necrosis (HCC)   . Acute systolic heart failure ([place])   . CAD (coronary artery disease)    a. s/p prior PCI. b. CABG 2007 at [facility] in [place] 2007. c. inferior STEMI 10/2015 s/p DES to dSVG-PDA.  . Cardiac arrest ([place])   . Cervical cancer ([place])   . Chronic diastolic CHF (congestive heart failure) ([place])   . Chronic respiratory failure ([place])    s/p tracheostomy 2002  . Chronic RUQ pain   . COPD (chronic obstructive pulmonary disease) ([place])   . Diabetes mellitus ([place])   . DVT (deep venous thrombosis) ([place])   . Endometriosis   . History of gallstones 01/2016   seen on Ultrasound  . History of HIDA scan 11/2016    normal  . HTN (hypertension)   . Hyperlipidemia   . Lupus anticoagulant disorder (HCC)    on coumadin  . Morbid obesity ([place])   . ST elevation (STEMI) myocardial infarction involving right coronary artery ([place]) 10/29/15   stent to VG to PDA  . Toe fracture, right 03/29/2018  . Tracheostomy in place [facility]), chronic since 2002 11/03/2015  . Tracheostomy status [facility])     Assessment: 54 yo female admitted for acute on chronic CHF, to continue PTA warfarin for a history of VTE and lupus. PTA last dose of warfarin taken 8/12. PTA warfarin regimen of 2.5 mg per day except 5 mg on Mon, Wed, Fri.   INR now therapeutic at 2, pt to be D/Cd on home regimen.  Goal of Therapy:  INR 2-3   Plan:  Home regimen as above Stop enoxaparin   Arrie Senate, PharmD, BCPS Clinical Pharmacist [phone] Please check AMION for all [place] numbers 04/25/2020

## 2020-04-25 NOTE — Plan of Care (Signed)
  Problem: Education: Goal: Knowledge of General Education information will improve Description: Including pain rating scale, medication(s)/side effects and non-pharmacologic comfort measures Outcome: Progressing   Problem: Activity: Goal: Risk for activity intolerance will decrease Outcome: Progressing   Problem: Nutrition: Goal: Adequate nutrition will be maintained Outcome: Progressing   Problem: Coping: Goal: Level of anxiety will decrease Outcome: Progressing   

## 2020-04-25 NOTE — Plan of Care (Signed)
  Problem: Education: Goal: Knowledge of General Education information will improve Description: Including pain rating scale, medication(s)/side effects and non-pharmacologic comfort measures Outcome: Adequate for Discharge   Problem: Clinical Measurements: Goal: Ability to maintain clinical measurements within normal limits will improve Outcome: Adequate for Discharge   Problem: Clinical Measurements: Goal: Will remain free from infection Outcome: Completed/Met Goal: Diagnostic test results will improve Outcome: Completed/Met Goal: Respiratory complications will improve Outcome: Completed/Met Goal: Cardiovascular complication will be avoided Outcome: Completed/Met   Problem: Activity: Goal: Risk for activity intolerance will decrease Outcome: Completed/Met   Problem: Nutrition: Goal: Adequate nutrition will be maintained Outcome: Completed/Met   Problem: Coping: Goal: Level of anxiety will decrease Outcome: Completed/Met   Problem: Elimination: Goal: Will not experience complications related to bowel motility Outcome: Completed/Met Goal: Will not experience complications related to urinary retention Outcome: Completed/Met   Problem: Pain Managment: Goal: General experience of comfort will improve Outcome: Completed/Met   Problem: Safety: Goal: Ability to remain free from injury will improve Outcome: Completed/Met   Problem: Skin Integrity: Goal: Risk for impaired skin integrity will decrease Outcome: Completed/Met   Problem: Education: Goal: Ability to demonstrate management of disease process will improve Outcome: Completed/Met Goal: Ability to verbalize understanding of medication therapies will improve Outcome: Completed/Met   Problem: Activity: Goal: Capacity to carry out activities will improve Outcome: Completed/Met   Problem: Cardiac: Goal: Ability to achieve and maintain adequate cardiopulmonary perfusion will improve Outcome: Completed/Met    Problem: Health Behavior/Discharge Planning: Goal: Ability to manage health-related needs will improve Outcome: Not Applicable   Problem: Education: Goal: Individualized Educational Video(s) Outcome: Not Applicable

## 2020-04-26 DIAGNOSIS — Z9981 Dependence on supplemental oxygen: Secondary | ICD-10-CM | POA: Diagnosis not present

## 2020-04-26 DIAGNOSIS — E119 Type 2 diabetes mellitus without complications: Secondary | ICD-10-CM | POA: Diagnosis not present

## 2020-04-26 DIAGNOSIS — Z79891 Long term (current) use of opiate analgesic: Secondary | ICD-10-CM | POA: Diagnosis not present

## 2020-04-26 DIAGNOSIS — I110 Hypertensive heart disease with heart failure: Secondary | ICD-10-CM | POA: Diagnosis not present

## 2020-04-26 DIAGNOSIS — J9610 Chronic respiratory failure, unspecified whether with hypoxia or hypercapnia: Secondary | ICD-10-CM | POA: Diagnosis not present

## 2020-04-26 DIAGNOSIS — I2510 Atherosclerotic heart disease of native coronary artery without angina pectoris: Secondary | ICD-10-CM | POA: Diagnosis not present

## 2020-04-26 DIAGNOSIS — G8929 Other chronic pain: Secondary | ICD-10-CM | POA: Diagnosis not present

## 2020-04-26 DIAGNOSIS — I272 Pulmonary hypertension, unspecified: Secondary | ICD-10-CM | POA: Diagnosis not present

## 2020-04-26 DIAGNOSIS — E539 Vitamin B deficiency, unspecified: Secondary | ICD-10-CM | POA: Diagnosis not present

## 2020-04-26 DIAGNOSIS — N170 Acute kidney failure with tubular necrosis: Secondary | ICD-10-CM | POA: Diagnosis not present

## 2020-04-26 DIAGNOSIS — I82409 Acute embolism and thrombosis of unspecified deep veins of unspecified lower extremity: Secondary | ICD-10-CM | POA: Diagnosis not present

## 2020-04-26 DIAGNOSIS — Z7901 Long term (current) use of anticoagulants: Secondary | ICD-10-CM | POA: Diagnosis not present

## 2020-04-26 DIAGNOSIS — I5023 Acute on chronic systolic (congestive) heart failure: Secondary | ICD-10-CM | POA: Diagnosis not present

## 2020-04-26 DIAGNOSIS — D6862 Lupus anticoagulant syndrome: Secondary | ICD-10-CM | POA: Diagnosis not present

## 2020-04-26 DIAGNOSIS — Z930 Tracheostomy status: Secondary | ICD-10-CM | POA: Diagnosis not present

## 2020-04-26 DIAGNOSIS — I252 Old myocardial infarction: Secondary | ICD-10-CM | POA: Diagnosis not present

## 2020-04-26 DIAGNOSIS — I5043 Acute on chronic combined systolic (congestive) and diastolic (congestive) heart failure: Secondary | ICD-10-CM | POA: Diagnosis not present

## 2020-04-26 DIAGNOSIS — E785 Hyperlipidemia, unspecified: Secondary | ICD-10-CM | POA: Diagnosis not present

## 2020-04-26 DIAGNOSIS — F17210 Nicotine dependence, cigarettes, uncomplicated: Secondary | ICD-10-CM | POA: Diagnosis not present

## 2020-04-26 DIAGNOSIS — J449 Chronic obstructive pulmonary disease, unspecified: Secondary | ICD-10-CM | POA: Diagnosis not present

## 2020-04-26 DIAGNOSIS — Z794 Long term (current) use of insulin: Secondary | ICD-10-CM | POA: Diagnosis not present

## 2020-04-26 DIAGNOSIS — I471 Supraventricular tachycardia: Secondary | ICD-10-CM | POA: Diagnosis not present

## 2020-04-26 DIAGNOSIS — N809 Endometriosis, unspecified: Secondary | ICD-10-CM | POA: Diagnosis not present

## 2020-04-26 DIAGNOSIS — Z951 Presence of aortocoronary bypass graft: Secondary | ICD-10-CM | POA: Diagnosis not present

## 2020-04-26 DIAGNOSIS — Z6841 Body Mass Index (BMI) 40.0 and over, adult: Secondary | ICD-10-CM | POA: Diagnosis not present

## 2020-04-27 NOTE — Progress Notes (Signed)
Cardiology Office Note:    Date:  05/05/2020   ID:  Theresa [name], DOB 14-Sep-1965, MRN [id number]  PCP:  Theresa Blitz, MD  Cardiologist:  Theresa Breeding, MD  Electrophysiologist:  None   Referring MD: Theresa Blitz, MD   Chief Complaint: hospital follow-up for CHF  History of Present Illness:    Theresa [name] is a 54 y.o. female with a history of history of CAD with CABG x2 in 2007 in [place], [place] at [facility] and subsequent inferior STEMI in 10/2015 s/p DES to dSVG-PDA graft, chronic combined CHF with EF of 40-45%, COPD with chronic respiratory failure s/p trach in 2002, lupus anticoagulant disorder with DVT on chronic Coumadin, hypertension, hyperlipidemia, diabetes mellitus, and morbid obesity with BMI of 49.8 who is followed by Dr. Percival [name] and presents today for hospital follow-up for acute on chronic CHF.  Last ischemic evaluation was a right/left heart cath in 03/2018 during admission for chest pain and CHF. Cath showed severe native CAD with patent LIMA to LAD but total occlusion of SVG to RCA. RCA territory was being supplied by collaterals from LAD and circumflex. Also noted to have moderate to severe pulmonary hypertension.  Continue medical therapy was recommended.   Patient recently admitted from 04/17/2020 to 04/25/2020 for acute on chronic CHF after presenting with shortness of breath and bilateral lower extremity edema. BNP elevated at 550. Chest CTA negative for PE. Echo showed LVEF of 40-45% with hypokinesis of the mid and distal anterior septum, basal inferolateral segment, basal inferior segment, and apex as well as grade III diastolic dysfunction. She was diuresed with IV Lasix and then transitioned to Torsemide 40mg  twice daily prior to discharge. Discharge weight was 205 lbs.   Patient presents today for follow-up. Patient up 9 lbs on our scales from discharge. She has been weighing herself daily at home and is up 4 lbs on her home scale. She has chronic  shortness of breath but denies any worsening of this since discharge. Stable orthopnea and no PND. She reports a little worsening edema of lower extremities (lef ankle slightly more swollen) than right). She admits to eating a lot of takeout foots but states she has been trying to cook more since discharge. She also notes some fluttering on almost a daily basis. She was noted to have frequent PVCs (sometimes in bigeminy pattern) during recent admission. She admits to drinking a lot of caffeine. No chest pain. No lightheadedness, dizziness, or syncope. No abnormal bleeding on Coumadin. She currently has Psoriasis outbreak "all over" and PCP recently prescribed Prednisone for this.  Past Medical History:  Diagnosis Date  . Acute kidney failure with lesion of tubular necrosis (HCC)   . Acute systolic heart failure ([place])   . CAD (coronary artery disease)    a. s/p prior PCI. b. CABG 2007 at [facility] in [place] 2007. c. inferior STEMI 10/2015 s/p DES to dSVG-PDA.  . Cardiac arrest ([place])   . Cervical cancer ([place])   . Chronic diastolic CHF (congestive heart failure) ([place])   . Chronic respiratory failure ([place])    s/p tracheostomy 2002  . Chronic RUQ pain   . COPD (chronic obstructive pulmonary disease) ([place])   . Diabetes mellitus ([place])   . DVT (deep venous thrombosis) ([place])   . Endometriosis   . History of gallstones 01/2016   seen on Ultrasound  . History of HIDA scan 11/2016   normal  . HTN (hypertension)   . Hyperlipidemia   .  Lupus anticoagulant disorder (HCC)    on coumadin  . Morbid obesity ([place])   . ST elevation (STEMI) myocardial infarction involving right coronary artery ([place]) 10/29/15   stent to VG to PDA  . Toe fracture, right 03/29/2018  . Tracheostomy in place [facility]), chronic since 2002 11/03/2015  . Tracheostomy status [facility])     Past Surgical History:  Procedure Laterality Date  . CARDIAC CATHETERIZATION N/A 10/29/2015   Procedure: Left Heart Cath and Cors/Grafts  Angiography;  Surgeon: Burnell Blanks, MD;  Location: [place] [facility];  Service: Cardiovascular;  Laterality: N/A;  . CARDIAC CATHETERIZATION  10/29/2015   Procedure: Coronary Stent Intervention;  Surgeon: Burnell Blanks, MD;  Location: [place] [facility];  Service: Cardiovascular;;  . CAROTID STENT    . CESAREAN SECTION WITH BILATERAL TUBAL LIGATION    . CORONARY ARTERY BYPASS GRAFT  2007   2V  . IR GASTROSTOMY TUBE MOD SED  11/13/2018  . RIGHT/LEFT HEART CATH AND CORONARY/GRAFT ANGIOGRAPHY N/A 03/24/2018   Procedure: RIGHT/LEFT HEART CATH AND CORONARY/GRAFT ANGIOGRAPHY;  Surgeon: Belva Crome, MD;  Location: [place] [facility];  Service: Cardiovascular;  Laterality: N/A;  . TRACHEOSTOMY      Current Medications: Current Meds  Medication Sig  . albuterol (PROVENTIL) (2.5 MG/3ML) 0.083% nebulizer solution Take 3 mLs (2.5 mg total) by nebulization 2 (two) times daily. (Patient taking differently: Take 2.5 mg by nebulization every 4 (four) hours as needed for wheezing. )  . albuterol (VENTOLIN HFA) 108 (90 Base) MCG/ACT inhaler Inhale 2 puffs into the lungs every 4 (four) hours as needed for wheezing or shortness of breath.  Marland Kitchen atorvastatin (LIPITOR) 20 MG tablet Take 1 tablet (20 mg total) by mouth daily.  Marland Kitchen gabapentin (NEURONTIN) 300 MG capsule Take 1 capsule (300mg ) in the morning, 1 capsule (300mg ) in the afternoon, and 2 capsules (600mg ) at bedtime (Patient taking differently: Take 900 mg by mouth at bedtime. )  . insulin degludec (TRESIBA FLEXTOUCH) 100 UNIT/ML FlexTouch Pen Inject 50 Units into the skin at bedtime.   Marland Kitchen LORazepam (ATIVAN) 0.5 MG tablet Take 0.5 mg by mouth every 6 (six) hours as needed for anxiety.   Marland Kitchen losartan (COZAAR) 25 MG tablet Take 1 tablet (25 mg total) by mouth daily.  . metFORMIN (GLUCOPHAGE) 500 MG tablet Take 500 mg by mouth every morning.  . metoprolol succinate (TOPROL-XL) 50 MG 24 hr tablet Take 1 tablet (50 mg total) by mouth daily.  .  nitroGLYCERIN (NITROSTAT) 0.4 MG SL tablet Place 0.4 mg under the tongue every 5 (five) minutes as needed for chest pain.  Marland Kitchen NOVOLOG FLEXPEN 100 UNIT/ML FlexPen Inject 1-10 Units into the skin 3 (three) times daily before meals.  Marland Kitchen OZEMPIC, 0.25 OR 0.5 MG/DOSE, 2 MG/1.5ML SOPN Inject 0.5 mg into the skin every Wednesday.   . pantoprazole (PROTONIX) 20 MG tablet Take 20 mg by mouth daily.  . potassium chloride SA (KLOR-CON) 20 MEQ tablet Take 20 mEq by mouth daily.  . predniSONE (STERAPRED UNI-PAK 21 TAB) 5 MG (21) TBPK tablet Take by mouth.  . sertraline (ZOLOFT) 50 MG tablet Take 50 mg by mouth at bedtime.   . tamsulosin (FLOMAX) 0.4 MG CAPS capsule Take 0.4 mg by mouth daily.  Marland Kitchen torsemide (DEMADEX) 20 MG tablet Take 2 tablets (40 mg total) by mouth 2 (two) times daily.  . traMADol (ULTRAM) 50 MG tablet Take 50 mg by mouth 3 (three) times daily as needed.  . warfarin (COUMADIN) 2.5  MG tablet Take 2 tablets (5 mg) tonight at 6 PM.  Go to your PCP for INR tomorrow. (Patient taking differently: Take 2.5-5 mg by mouth every morning. Take 2 tablets (5 mg) tonight at 6 PM.  Go to your PCP for INR tomorrow. Mon Wed Fri and Sat 5 mg; all other days is 2.5 mg)     Allergies:   Penicillins, Ciprofloxacin, and Ibuprofen   Social History   Socioeconomic History  . Marital status: Married    Spouse name: Not on file  . Number of children: Not on file  . Years of education: Not on file  . Highest education level: Not on file  Occupational History  . Not on file  Tobacco Use  . Smoking status: Current Every Day Smoker    Packs/day: 0.75    Types: Cigarettes    Start date: 10/23/1978  . Smokeless tobacco: Never Used  . Tobacco comment: 1/2 pack - trying to cut back   Vaping Use  . Vaping Use: Never used  Substance and Sexual Activity  . Alcohol use: No    Alcohol/week: 0.0 standard drinks  . Drug use: No  . Sexual activity: Not on file  Other Topics Concern  . Not on file  Social History  Narrative  . Not on file   Social Determinants of Health   Financial Resource Strain:   . Difficulty of Paying Living Expenses: Not on file  Food Insecurity:   . Worried About Charity fundraiser in the Last Year: Not on file  . Ran Out of Food in the Last Year: Not on file  Transportation Needs:   . Lack of Transportation (Medical): Not on file  . Lack of Transportation (Non-Medical): Not on file  Physical Activity:   . Days of Exercise per Week: Not on file  . Minutes of Exercise per Session: Not on file  Stress:   . Feeling of Stress : Not on file  Social Connections:   . Frequency of Communication with Friends and Family: Not on file  . Frequency of Social Gatherings with Friends and Family: Not on file  . Attends Religious Services: Not on file  . Active Member of Clubs or Organizations: Not on file  . Attends Archivist Meetings: Not on file  . Marital Status: Not on file     Family History: The patient's family history includes Diabetes in her father and mother; Hypertension in her father and mother.  ROS:   Please see the history of present illness.     EKGs/Labs/Other Studies Reviewed:    The following studies were reviewed today:  Right/Left Heart Catheterization 03/24/2018:  Severe native coronary artery disease with total occlusion of the proximal LAD, total occlusion of the proximal RCA, and patent circumflex with patent proximal stent with eccentric 50% proximal narrowing.  Distal obtuse marginal branches are severely and diffusely diseased.  Bypass graft failure with total occlusion of SVG to RCA.  Patent LIMA to LAD.  Right coronary territory is supplied by collaterals from LAD and circumflex.  Left ventricular systolic dysfunction with EF 35 to 45%.  Mid to distal inferior wall akinesis.  Inferobasal and inferoapical wall severely hypokinetic.  Left ventricular end-diastolic pressure elevated at 19 mmHg  Moderate to severe pulmonary  hypertension  Recommendations:  Resume Coumadin therapy overlap with heparin  Continue Plavix  Management of pulmonary status to maintain adequate oxygenation which may help decrease severity of pulmonary hypertension.  Recommend to resume Warfarin,  at currently prescribed dose and frequency, on today.  Recommend concurrent antiplatelet therapy of Clopidogrel 75mg  daily for Indefinite. _______________  Echocardiogram 04/20/2020: Impressions: 1. Left ventricular ejection fraction, by estimation, is 40 to 45%. The  left ventricle has mildly decreased function. The left ventricle  demonstrates regional wall motion abnormalities (see scoring  diagram/findings for description). Left ventricular  diastolic parameters are consistent with Grade III diastolic dysfunction  (restrictive). Elevated left atrial pressure.  2. Right ventricular systolic function is normal. The right ventricular  size is normal. There is mildly elevated pulmonary artery systolic  pressure. The estimated right ventricular systolic pressure is 03.2 mmHg.  3. Left atrial size was mildly dilated.  4. Right atrial size was mildly dilated.  5. The mitral valve is degenerative. Mild mitral valve regurgitation.  6. The aortic valve is tricuspid. Aortic valve regurgitation is not  visualized. Mild aortic valve sclerosis is present, with no evidence of  aortic valve stenosis.  7. The inferior vena cava is dilated in size with <50% respiratory  variability, suggesting right atrial pressure of 15 mmHg.    EKG:  EKG ordered today. EKG personally reviewed and demonstrates normal sinus rhythm, rate 60 bpm with mild (<1 mm) ST depression in lead II, mild ST depression/T wave inversion in I and aVL. Otherwise, non-specific ST/T changes. No acute ischemic changes compared to prior tracings. Normal axis. Normal PR and QRS intervals. QTc 440 ms.  Recent Labs: 05/17/2019: ALT 21 04/17/2020: B Natriuretic Peptide  550.3 04/18/2020: TSH 1.272 04/20/2020: Hemoglobin 12.1; Platelets 296 04/24/2020: BUN 26; Creatinine, Ser 1.02; Magnesium 2.3; Potassium 4.6; Sodium 138  Recent Lipid Panel    Component Value Date/Time   CHOL 167 03/24/2018 0451   TRIG 101 03/24/2018 0451   HDL 44 03/24/2018 0451   CHOLHDL 3.8 03/24/2018 0451   VLDL 20 03/24/2018 0451   LDLCALC 103 (H) 03/24/2018 0451    Physical Exam:    Vital Signs: BP 138/80   Pulse 66   Ht 4\' 9"  (1.448 m)   Wt 214 lb 6.4 oz (97.3 kg)   SpO2 93%   BMI 46.40 kg/m     Wt Readings from Last 3 Encounters:  05/05/20 214 lb 6.4 oz (97.3 kg)  04/25/20 206 lb (93.4 kg)  07/27/19 194 lb (88 kg)     General: 54 y.o. female in no acute distress. HEENT: Normocephalic and atraumatic. Sclera clear.  Neck: Supple. No carotid bruits. No JVD. Heart: RRR. Distinct S1 and S2. No murmurs, gallops, or rubs. Radial pulses 2+ and equal bilaterally. Lungs: No increased work of breathing. Clear to ausculation bilaterally. No wheezes, rhonchi, or rales.  Abdomen: Soft, non-distended, and non-tender to palpation. Bowel sounds present. Extremities: Mild lower extremity edema of bilateral lower extremities (mostly around ankles, left > right). Skin: Warm and dry. Psoriasis flare on breast.   Neuro: No focal deficits. Psych: Normal affect. Responds appropriately.   Assessment:    1. Chronic combined systolic and diastolic CHF (congestive heart failure) (HCC)   2. Palpitations   3. Coronary artery disease involving native coronary artery of native heart without angina pectoris   4. Essential hypertension   5. Hyperlipidemia, unspecified hyperlipidemia type   6. Chronic respiratory failure with hypoxia (HCC)   7. Lupus anticoagulant syndrome (HCC)     Plan:    Chronic Combined CHF - Patient recently admitted with acute on chronic CHF. - Echo on 04/20/2020 showed LVEF of 40-45% with hypokinesis of mid and distal anterior septum, basal  inferolateral segment,  basal inferior segment, and apex as well as abnormal (paradoxical) septal motion consistent with post-op status and grade 3 diastolic dysfunction. Wall motion abnormalities do not apepar to be new. - Weight up 9 lbs on our scales and 4 lbs on home scales since discharge. However, patient does not appear significantly volume overloaded. No worsening shortness of breath. - Will increase Torsemide to 60mg  in the morning and 40mg  in the evening for the next 3 days and then go back to 40mg  twice daily.  - Continue Losartan 25mg  daily and Toprol-XL 50mg  daily.  - Discussed importance of continuing to weigh daily and limit salt intake. Sal[place] sheet provided. - Will check CMET today.  Palpitations/ PVCs - Patient reports fluttering sensation on almost daily basis. She was noted to have frequent PVCs (sometimes in bigeminy pattern) during recent admission so suspect this is what she is feeling.  - EKG today showed sinus rhythm with no arrhythmias. Rate 60 bpm.  - Continue Toprol-XL 50mg  daily. Will not up-titrate this given low resting heart rate.  - Recommended limiting caffeine intake.  - Will check CMET and Mg today to ensure electrolytes are stable. TSH normal during recent admission.  - If palpitations worsen, can consider monitor.   CAD  - S/p CABG in 2007 with subsequent inferior STEMI in 10/2015 treated with DES to dSVG-PDA graft.  - No angina.  - Continue beta-blocker and statin. No Aspirin due to need for Coumadin.  Hypertension - BP slightly above goal at 138/80 today. However, she has recently been on Prednisone for Psoriasis flare which may be contributing. - Will continue Losartan and Toprol-XL as above.  Hyperlipidemia - Most recent lipid panel form 03/2018: Total Cholesterol 167, Triglycerides 101, HDL 44, LDL 103.  - Continue Lipitor 20mg  daily.  - Patient fasting today so will go ahead and recheck lipid panel.  COPD with Chronic Respiratory Failure - S/p tracheostomy in  2002.  Lupus Anticoagulant Disorder with Prior DVT - Patent's left lower extremity slightly more swollen than right (mainly around ankle). This is the leg patient has previously had a DVT. Advised patient to let us know if she continues to have asymmetric swelling or develops other signs of DVT on increased dose of Torsemide. - On chronic Coumadin. Managed by PCP.   Disposition: Follow up in 1-2 months.   Medication Adjustments/Labs and Tests Ordered: Current medicines are reviewed at length with the patient today.  Concerns regarding medicines are outlined above.  Orders Placed This Encounter  Procedures  . Comprehensive metabolic panel  . Lipid panel  . Magnesium   No orders of the defined types were placed in this encounter.   Patient Instructions  Medication Instructions:   INCREASE Torsemide to 60 mg in the mornings and 40 mg in the afternoon/evenings for 3 days  *If you need a refill on your cardiac medications before your next appointment, please call your pharmacy*  Lab Work: Your physician recommends that you return for lab work TODAY:   CMET  Fasting Lipid Panel  Magnesium If you have labs (blood work) drawn today and your tests are completely normal, you will receive your results only by: Marland Kitchen MyChart Message (if you have MyChart) OR . A paper copy in the mail If you have any lab test that is abnormal or we need to change your treatment, we will call you to review the results.  Testing/Procedures: NONE ordered at this time of appointment   Follow-Up: At [facility],  you and your health needs are our priority.  As part of our continuing mission to provide you with exceptional heart care, we have created designated Provider Care Teams.  These Care Teams include your primary Cardiologist (physician) and Advanced Practice Providers (APPs -  Physician Assistants and Nurse Practitioners) who all work together to provide you with the care you need, when you need  it.  We recommend signing up for the patient portal called "MyChart".  Sign up information is provided on this After Visit Summary.  MyChart is used to connect with patients for Virtual Visits (Telemedicine).  Patients are able to view lab/test results, encounter notes, upcoming appointments, etc.  Non-urgent messages can be sent to your provider as well.   To learn more about what you can do with MyChart, go to [email or web address].    Your next appointment:   1 month(s)  The format for your next appointment:   In Person  Provider:   Rosaria Ferries, PA-C, Jory Sims, DNP, ANP or Sande Rives, PA-c, Almyra Deforest, PA-C, Kerin Ransom, [place]  Other Instructions   DECREASE caffeine intake         Signed, Eppie Gibson  05/05/2020 9:28 AM    [place]

## 2020-04-28 DIAGNOSIS — I2510 Atherosclerotic heart disease of native coronary artery without angina pectoris: Secondary | ICD-10-CM | POA: Diagnosis not present

## 2020-04-28 DIAGNOSIS — I252 Old myocardial infarction: Secondary | ICD-10-CM | POA: Diagnosis not present

## 2020-04-28 DIAGNOSIS — J449 Chronic obstructive pulmonary disease, unspecified: Secondary | ICD-10-CM | POA: Diagnosis not present

## 2020-04-28 DIAGNOSIS — I110 Hypertensive heart disease with heart failure: Secondary | ICD-10-CM | POA: Diagnosis not present

## 2020-04-28 DIAGNOSIS — I5043 Acute on chronic combined systolic (congestive) and diastolic (congestive) heart failure: Secondary | ICD-10-CM | POA: Diagnosis not present

## 2020-04-28 DIAGNOSIS — J9610 Chronic respiratory failure, unspecified whether with hypoxia or hypercapnia: Secondary | ICD-10-CM | POA: Diagnosis not present

## 2020-04-29 DIAGNOSIS — I82409 Acute embolism and thrombosis of unspecified deep veins of unspecified lower extremity: Secondary | ICD-10-CM | POA: Diagnosis not present

## 2020-04-29 DIAGNOSIS — M329 Systemic lupus erythematosus, unspecified: Secondary | ICD-10-CM | POA: Diagnosis not present

## 2020-04-29 DIAGNOSIS — I10 Essential (primary) hypertension: Secondary | ICD-10-CM | POA: Diagnosis not present

## 2020-04-29 DIAGNOSIS — I5042 Chronic combined systolic (congestive) and diastolic (congestive) heart failure: Secondary | ICD-10-CM | POA: Diagnosis not present

## 2020-04-29 DIAGNOSIS — Z299 Encounter for prophylactic measures, unspecified: Secondary | ICD-10-CM | POA: Diagnosis not present

## 2020-04-29 DIAGNOSIS — F17210 Nicotine dependence, cigarettes, uncomplicated: Secondary | ICD-10-CM | POA: Diagnosis not present

## 2020-04-30 DIAGNOSIS — J449 Chronic obstructive pulmonary disease, unspecified: Secondary | ICD-10-CM | POA: Diagnosis not present

## 2020-04-30 DIAGNOSIS — I2510 Atherosclerotic heart disease of native coronary artery without angina pectoris: Secondary | ICD-10-CM | POA: Diagnosis not present

## 2020-04-30 DIAGNOSIS — I110 Hypertensive heart disease with heart failure: Secondary | ICD-10-CM | POA: Diagnosis not present

## 2020-04-30 DIAGNOSIS — I5043 Acute on chronic combined systolic (congestive) and diastolic (congestive) heart failure: Secondary | ICD-10-CM | POA: Diagnosis not present

## 2020-04-30 DIAGNOSIS — I252 Old myocardial infarction: Secondary | ICD-10-CM | POA: Diagnosis not present

## 2020-04-30 DIAGNOSIS — J9610 Chronic respiratory failure, unspecified whether with hypoxia or hypercapnia: Secondary | ICD-10-CM | POA: Diagnosis not present

## 2020-05-01 DIAGNOSIS — I2510 Atherosclerotic heart disease of native coronary artery without angina pectoris: Secondary | ICD-10-CM | POA: Diagnosis not present

## 2020-05-01 DIAGNOSIS — I110 Hypertensive heart disease with heart failure: Secondary | ICD-10-CM | POA: Diagnosis not present

## 2020-05-01 DIAGNOSIS — J9610 Chronic respiratory failure, unspecified whether with hypoxia or hypercapnia: Secondary | ICD-10-CM | POA: Diagnosis not present

## 2020-05-01 DIAGNOSIS — J449 Chronic obstructive pulmonary disease, unspecified: Secondary | ICD-10-CM | POA: Diagnosis not present

## 2020-05-01 DIAGNOSIS — I5043 Acute on chronic combined systolic (congestive) and diastolic (congestive) heart failure: Secondary | ICD-10-CM | POA: Diagnosis not present

## 2020-05-01 DIAGNOSIS — I252 Old myocardial infarction: Secondary | ICD-10-CM | POA: Diagnosis not present

## 2020-05-03 DIAGNOSIS — I110 Hypertensive heart disease with heart failure: Secondary | ICD-10-CM | POA: Diagnosis not present

## 2020-05-03 DIAGNOSIS — J449 Chronic obstructive pulmonary disease, unspecified: Secondary | ICD-10-CM | POA: Diagnosis not present

## 2020-05-03 DIAGNOSIS — I2510 Atherosclerotic heart disease of native coronary artery without angina pectoris: Secondary | ICD-10-CM | POA: Diagnosis not present

## 2020-05-03 DIAGNOSIS — J9610 Chronic respiratory failure, unspecified whether with hypoxia or hypercapnia: Secondary | ICD-10-CM | POA: Diagnosis not present

## 2020-05-03 DIAGNOSIS — I5043 Acute on chronic combined systolic (congestive) and diastolic (congestive) heart failure: Secondary | ICD-10-CM | POA: Diagnosis not present

## 2020-05-03 DIAGNOSIS — I252 Old myocardial infarction: Secondary | ICD-10-CM | POA: Diagnosis not present

## 2020-05-05 ENCOUNTER — Other Ambulatory Visit: Payer: Self-pay

## 2020-05-05 ENCOUNTER — Encounter: Payer: Self-pay | Admitting: Student

## 2020-05-05 ENCOUNTER — Ambulatory Visit (INDEPENDENT_AMBULATORY_CARE_PROVIDER_SITE_OTHER): Payer: Medicare Other | Admitting: Student

## 2020-05-05 VITALS — BP 138/80 | HR 66 | Ht <= 58 in | Wt 214.4 lb

## 2020-05-05 DIAGNOSIS — I10 Essential (primary) hypertension: Secondary | ICD-10-CM

## 2020-05-05 DIAGNOSIS — I2510 Atherosclerotic heart disease of native coronary artery without angina pectoris: Secondary | ICD-10-CM

## 2020-05-05 DIAGNOSIS — R002 Palpitations: Secondary | ICD-10-CM | POA: Diagnosis not present

## 2020-05-05 DIAGNOSIS — I5042 Chronic combined systolic (congestive) and diastolic (congestive) heart failure: Secondary | ICD-10-CM | POA: Diagnosis not present

## 2020-05-05 DIAGNOSIS — J9611 Chronic respiratory failure with hypoxia: Secondary | ICD-10-CM

## 2020-05-05 DIAGNOSIS — I5043 Acute on chronic combined systolic (congestive) and diastolic (congestive) heart failure: Secondary | ICD-10-CM | POA: Diagnosis not present

## 2020-05-05 DIAGNOSIS — I110 Hypertensive heart disease with heart failure: Secondary | ICD-10-CM | POA: Diagnosis not present

## 2020-05-05 DIAGNOSIS — E785 Hyperlipidemia, unspecified: Secondary | ICD-10-CM | POA: Diagnosis not present

## 2020-05-05 DIAGNOSIS — J449 Chronic obstructive pulmonary disease, unspecified: Secondary | ICD-10-CM | POA: Diagnosis not present

## 2020-05-05 DIAGNOSIS — J9610 Chronic respiratory failure, unspecified whether with hypoxia or hypercapnia: Secondary | ICD-10-CM | POA: Diagnosis not present

## 2020-05-05 DIAGNOSIS — I252 Old myocardial infarction: Secondary | ICD-10-CM | POA: Diagnosis not present

## 2020-05-05 DIAGNOSIS — E119 Type 2 diabetes mellitus without complications: Secondary | ICD-10-CM | POA: Diagnosis not present

## 2020-05-05 DIAGNOSIS — D6862 Lupus anticoagulant syndrome: Secondary | ICD-10-CM | POA: Diagnosis not present

## 2020-05-05 LAB — COMPREHENSIVE METABOLIC PANEL
ALT: 14 IU/L (ref 0–32)
AST: 12 IU/L (ref 0–40)
Albumin/Globulin Ratio: 1.2 (ref 1.2–2.2)
Albumin: 3.9 g/dL (ref 3.8–4.9)
Alkaline Phosphatase: 153 IU/L — ABNORMAL HIGH (ref 48–121)
BUN/Creatinine Ratio: 31 — ABNORMAL HIGH (ref 9–23)
BUN: 25 mg/dL — ABNORMAL HIGH (ref 6–24)
Bilirubin Total: 0.4 mg/dL (ref 0.0–1.2)
CO2: 30 mmol/L — ABNORMAL HIGH (ref 20–29)
Calcium: 9.1 mg/dL (ref 8.7–10.2)
Chloride: 94 mmol/L — ABNORMAL LOW (ref 96–106)
Creatinine, Ser: 0.81 mg/dL (ref 0.57–1.00)
GFR calc Af Amer: 95 mL/min/{1.73_m2} (ref >59)
GFR calc non Af Amer: 83 mL/min/{1.73_m2} (ref >59)
Globulin, Total: 3.2 g/dL (ref 1.5–4.5)
Glucose: 141 mg/dL — ABNORMAL HIGH (ref 65–99)
Potassium: 4.3 mmol/L (ref 3.5–5.2)
Sodium: 140 mmol/L (ref 134–144)
Total Protein: 7.1 g/dL (ref 6.0–8.5)

## 2020-05-05 LAB — LIPID PANEL
Chol/HDL Ratio: 3.1 ratio (ref 0.0–4.4)
Cholesterol, Total: 221 mg/dL — ABNORMAL HIGH (ref 100–199)
HDL: 71 mg/dL (ref >39)
LDL Chol Calc (NIH): 121 mg/dL — ABNORMAL HIGH (ref 0–99)
Triglycerides: 165 mg/dL — ABNORMAL HIGH (ref 0–149)
VLDL Cholesterol Cal: 29 mg/dL (ref 5–40)

## 2020-05-05 LAB — MAGNESIUM: Magnesium: 2 mg/dL (ref 1.6–2.3)

## 2020-05-05 NOTE — Patient Instructions (Signed)
Medication Instructions:   INCREASE Torsemide to 60 mg in the mornings and 40 mg in the afternoon/evenings for 3 days  *If you need a refill on your cardiac medications before your next appointment, please call your pharmacy*  Lab Work: Your physician recommends that you return for lab work TODAY:   CMET  Fasting Lipid Panel  Magnesium If you have labs (blood work) drawn today and your tests are completely normal, you will receive your results only by: Marland Kitchen MyChart Message (if you have MyChart) OR . A paper copy in the mail If you have any lab test that is abnormal or we need to change your treatment, we will call you to review the results.  Testing/Procedures: NONE ordered at this time of appointment   Follow-Up: At [facility], you and your health needs are our priority.  As part of our continuing mission to provide you with exceptional heart care, we have created designated Provider Care Teams.  These Care Teams include your primary Cardiologist (physician) and Advanced Practice Providers (APPs -  Physician Assistants and Nurse Practitioners) who all work together to provide you with the care you need, when you need it.  We recommend signing up for the patient portal called "MyChart".  Sign up information is provided on this After Visit Summary.  MyChart is used to connect with patients for Virtual Visits (Telemedicine).  Patients are able to view lab/test results, encounter notes, upcoming appointments, etc.  Non-urgent messages can be sent to your provider as well.   To learn more about what you can do with MyChart, go to [email or web address].    Your next appointment:   1 month(s)  The format for your next appointment:   In Person  Provider:   Rosaria Ferries, PA-C, Jory Sims, DNP, ANP or Sande Rives, PA-c, Almyra Deforest, PA-C, Kerin Ransom, [place]  Other Instructions   DECREASE caffeine intake

## 2020-05-07 DIAGNOSIS — M329 Systemic lupus erythematosus, unspecified: Secondary | ICD-10-CM | POA: Diagnosis not present

## 2020-05-07 DIAGNOSIS — J449 Chronic obstructive pulmonary disease, unspecified: Secondary | ICD-10-CM | POA: Diagnosis not present

## 2020-05-07 DIAGNOSIS — Z299 Encounter for prophylactic measures, unspecified: Secondary | ICD-10-CM | POA: Diagnosis not present

## 2020-05-07 DIAGNOSIS — I252 Old myocardial infarction: Secondary | ICD-10-CM | POA: Diagnosis not present

## 2020-05-07 DIAGNOSIS — I5042 Chronic combined systolic (congestive) and diastolic (congestive) heart failure: Secondary | ICD-10-CM | POA: Diagnosis not present

## 2020-05-07 DIAGNOSIS — Z6841 Body Mass Index (BMI) 40.0 and over, adult: Secondary | ICD-10-CM | POA: Diagnosis not present

## 2020-05-07 DIAGNOSIS — J9610 Chronic respiratory failure, unspecified whether with hypoxia or hypercapnia: Secondary | ICD-10-CM | POA: Diagnosis not present

## 2020-05-07 DIAGNOSIS — I10 Essential (primary) hypertension: Secondary | ICD-10-CM | POA: Diagnosis not present

## 2020-05-07 DIAGNOSIS — I2510 Atherosclerotic heart disease of native coronary artery without angina pectoris: Secondary | ICD-10-CM | POA: Diagnosis not present

## 2020-05-07 DIAGNOSIS — I5043 Acute on chronic combined systolic (congestive) and diastolic (congestive) heart failure: Secondary | ICD-10-CM | POA: Diagnosis not present

## 2020-05-07 DIAGNOSIS — E1165 Type 2 diabetes mellitus with hyperglycemia: Secondary | ICD-10-CM | POA: Diagnosis not present

## 2020-05-07 DIAGNOSIS — I110 Hypertensive heart disease with heart failure: Secondary | ICD-10-CM | POA: Diagnosis not present

## 2020-05-09 ENCOUNTER — Telehealth: Payer: Self-pay | Admitting: Student

## 2020-05-09 MED ORDER — ATORVASTATIN CALCIUM 80 MG PO TABS: 80.0000 mg | ORAL_TABLET | Freq: Every day | ORAL | 1 refills | Status: DC

## 2020-05-09 NOTE — Telephone Encounter (Signed)
Follow Up:    Pt is returning Julie's call, concerning her lab results.

## 2020-05-09 NOTE — Telephone Encounter (Signed)
Pt advised of result. Atorvastatin increased to 80 mg daily, pt agreeable, Rx sent to pharmacy. Pt aware they will address follow up blood work at her f/u appt next month. Patient verbalized understanding and agreeable to plan.

## 2020-05-14 DIAGNOSIS — I2510 Atherosclerotic heart disease of native coronary artery without angina pectoris: Secondary | ICD-10-CM | POA: Diagnosis not present

## 2020-05-16 DIAGNOSIS — J9610 Chronic respiratory failure, unspecified whether with hypoxia or hypercapnia: Secondary | ICD-10-CM | POA: Diagnosis not present

## 2020-05-16 DIAGNOSIS — I2510 Atherosclerotic heart disease of native coronary artery without angina pectoris: Secondary | ICD-10-CM | POA: Diagnosis not present

## 2020-05-16 DIAGNOSIS — I252 Old myocardial infarction: Secondary | ICD-10-CM | POA: Diagnosis not present

## 2020-05-16 DIAGNOSIS — I5043 Acute on chronic combined systolic (congestive) and diastolic (congestive) heart failure: Secondary | ICD-10-CM | POA: Diagnosis not present

## 2020-05-16 DIAGNOSIS — I110 Hypertensive heart disease with heart failure: Secondary | ICD-10-CM | POA: Diagnosis not present

## 2020-05-16 DIAGNOSIS — J449 Chronic obstructive pulmonary disease, unspecified: Secondary | ICD-10-CM | POA: Diagnosis not present

## 2020-05-22 DIAGNOSIS — I5043 Acute on chronic combined systolic (congestive) and diastolic (congestive) heart failure: Secondary | ICD-10-CM | POA: Diagnosis not present

## 2020-05-22 DIAGNOSIS — I252 Old myocardial infarction: Secondary | ICD-10-CM | POA: Diagnosis not present

## 2020-05-22 DIAGNOSIS — I2510 Atherosclerotic heart disease of native coronary artery without angina pectoris: Secondary | ICD-10-CM | POA: Diagnosis not present

## 2020-05-22 DIAGNOSIS — J449 Chronic obstructive pulmonary disease, unspecified: Secondary | ICD-10-CM | POA: Diagnosis not present

## 2020-05-22 DIAGNOSIS — I110 Hypertensive heart disease with heart failure: Secondary | ICD-10-CM | POA: Diagnosis not present

## 2020-05-22 DIAGNOSIS — J9610 Chronic respiratory failure, unspecified whether with hypoxia or hypercapnia: Secondary | ICD-10-CM | POA: Diagnosis not present

## 2020-05-26 DIAGNOSIS — I2510 Atherosclerotic heart disease of native coronary artery without angina pectoris: Secondary | ICD-10-CM | POA: Diagnosis not present

## 2020-05-26 DIAGNOSIS — E119 Type 2 diabetes mellitus without complications: Secondary | ICD-10-CM | POA: Diagnosis not present

## 2020-05-26 DIAGNOSIS — Z79891 Long term (current) use of opiate analgesic: Secondary | ICD-10-CM | POA: Diagnosis not present

## 2020-05-26 DIAGNOSIS — F17210 Nicotine dependence, cigarettes, uncomplicated: Secondary | ICD-10-CM | POA: Diagnosis not present

## 2020-05-26 DIAGNOSIS — G8929 Other chronic pain: Secondary | ICD-10-CM | POA: Diagnosis not present

## 2020-05-26 DIAGNOSIS — Z930 Tracheostomy status: Secondary | ICD-10-CM | POA: Diagnosis not present

## 2020-05-26 DIAGNOSIS — N170 Acute kidney failure with tubular necrosis: Secondary | ICD-10-CM | POA: Diagnosis not present

## 2020-05-26 DIAGNOSIS — Z7901 Long term (current) use of anticoagulants: Secondary | ICD-10-CM | POA: Diagnosis not present

## 2020-05-26 DIAGNOSIS — J9610 Chronic respiratory failure, unspecified whether with hypoxia or hypercapnia: Secondary | ICD-10-CM | POA: Diagnosis not present

## 2020-05-26 DIAGNOSIS — D6862 Lupus anticoagulant syndrome: Secondary | ICD-10-CM | POA: Diagnosis not present

## 2020-05-26 DIAGNOSIS — J449 Chronic obstructive pulmonary disease, unspecified: Secondary | ICD-10-CM | POA: Diagnosis not present

## 2020-05-26 DIAGNOSIS — I110 Hypertensive heart disease with heart failure: Secondary | ICD-10-CM | POA: Diagnosis not present

## 2020-05-26 DIAGNOSIS — Z794 Long term (current) use of insulin: Secondary | ICD-10-CM | POA: Diagnosis not present

## 2020-05-26 DIAGNOSIS — I272 Pulmonary hypertension, unspecified: Secondary | ICD-10-CM | POA: Diagnosis not present

## 2020-05-26 DIAGNOSIS — I252 Old myocardial infarction: Secondary | ICD-10-CM | POA: Diagnosis not present

## 2020-05-26 DIAGNOSIS — I471 Supraventricular tachycardia: Secondary | ICD-10-CM | POA: Diagnosis not present

## 2020-05-26 DIAGNOSIS — Z9981 Dependence on supplemental oxygen: Secondary | ICD-10-CM | POA: Diagnosis not present

## 2020-05-26 DIAGNOSIS — I5043 Acute on chronic combined systolic (congestive) and diastolic (congestive) heart failure: Secondary | ICD-10-CM | POA: Diagnosis not present

## 2020-05-26 DIAGNOSIS — Z951 Presence of aortocoronary bypass graft: Secondary | ICD-10-CM | POA: Diagnosis not present

## 2020-05-26 DIAGNOSIS — E785 Hyperlipidemia, unspecified: Secondary | ICD-10-CM | POA: Diagnosis not present

## 2020-05-26 DIAGNOSIS — I82409 Acute embolism and thrombosis of unspecified deep veins of unspecified lower extremity: Secondary | ICD-10-CM | POA: Diagnosis not present

## 2020-05-26 DIAGNOSIS — N809 Endometriosis, unspecified: Secondary | ICD-10-CM | POA: Diagnosis not present

## 2020-05-26 DIAGNOSIS — Z6841 Body Mass Index (BMI) 40.0 and over, adult: Secondary | ICD-10-CM | POA: Diagnosis not present

## 2020-05-26 DIAGNOSIS — E539 Vitamin B deficiency, unspecified: Secondary | ICD-10-CM | POA: Diagnosis not present

## 2020-05-30 DIAGNOSIS — I110 Hypertensive heart disease with heart failure: Secondary | ICD-10-CM | POA: Diagnosis not present

## 2020-05-30 DIAGNOSIS — J9610 Chronic respiratory failure, unspecified whether with hypoxia or hypercapnia: Secondary | ICD-10-CM | POA: Diagnosis not present

## 2020-05-30 DIAGNOSIS — I5043 Acute on chronic combined systolic (congestive) and diastolic (congestive) heart failure: Secondary | ICD-10-CM | POA: Diagnosis not present

## 2020-05-30 DIAGNOSIS — J449 Chronic obstructive pulmonary disease, unspecified: Secondary | ICD-10-CM | POA: Diagnosis not present

## 2020-05-30 DIAGNOSIS — I2510 Atherosclerotic heart disease of native coronary artery without angina pectoris: Secondary | ICD-10-CM | POA: Diagnosis not present

## 2020-05-30 DIAGNOSIS — I252 Old myocardial infarction: Secondary | ICD-10-CM | POA: Diagnosis not present

## 2020-06-02 DIAGNOSIS — I10 Essential (primary) hypertension: Secondary | ICD-10-CM | POA: Diagnosis not present

## 2020-06-02 DIAGNOSIS — Z6841 Body Mass Index (BMI) 40.0 and over, adult: Secondary | ICD-10-CM | POA: Diagnosis not present

## 2020-06-02 DIAGNOSIS — I5042 Chronic combined systolic (congestive) and diastolic (congestive) heart failure: Secondary | ICD-10-CM | POA: Diagnosis not present

## 2020-06-02 DIAGNOSIS — I82409 Acute embolism and thrombosis of unspecified deep veins of unspecified lower extremity: Secondary | ICD-10-CM | POA: Diagnosis not present

## 2020-06-02 DIAGNOSIS — Z299 Encounter for prophylactic measures, unspecified: Secondary | ICD-10-CM | POA: Diagnosis not present

## 2020-06-02 DIAGNOSIS — L409 Psoriasis, unspecified: Secondary | ICD-10-CM | POA: Diagnosis not present

## 2020-06-03 DIAGNOSIS — E119 Type 2 diabetes mellitus without complications: Secondary | ICD-10-CM | POA: Diagnosis not present

## 2020-06-04 DIAGNOSIS — J449 Chronic obstructive pulmonary disease, unspecified: Secondary | ICD-10-CM | POA: Diagnosis not present

## 2020-06-04 DIAGNOSIS — I110 Hypertensive heart disease with heart failure: Secondary | ICD-10-CM | POA: Diagnosis not present

## 2020-06-04 DIAGNOSIS — I2510 Atherosclerotic heart disease of native coronary artery without angina pectoris: Secondary | ICD-10-CM | POA: Diagnosis not present

## 2020-06-04 DIAGNOSIS — I252 Old myocardial infarction: Secondary | ICD-10-CM | POA: Diagnosis not present

## 2020-06-04 DIAGNOSIS — J9610 Chronic respiratory failure, unspecified whether with hypoxia or hypercapnia: Secondary | ICD-10-CM | POA: Diagnosis not present

## 2020-06-04 DIAGNOSIS — I5043 Acute on chronic combined systolic (congestive) and diastolic (congestive) heart failure: Secondary | ICD-10-CM | POA: Diagnosis not present

## 2020-06-04 NOTE — Progress Notes (Deleted)
Cardiology Office Note:    Date:  06/04/2020   ID:  Theresa [name], DOB Oct 02, 1965, MRN [id number]  PCP:  Monico Blitz, MD  Cardiologist:  [name], MD  Electrophysiologist:  None   Referring MD: Monico Blitz, MD   Chief Complaint: follow-up of CHF  History of Present Illness:    Theresa [name] is a 54 y.o. female with a history of CAD with CABG x2 in 2007 in [place], [place] at [facility] and subsequent inferior STEMI in 10/2015 s/p DES to dSVG-PDA graft, chronic combined CHF with EF of 40-45%, COPD with chronic respiratory failure s/p trach in 2002, lupus anticoagulant disorder with DVT on chronic Coumadin, hypertension, hyperlipidemia, diabetes mellitus, and morbid obesity with BMI of 49.8 who is followed by Dr. Percival Spanish and presents today for hospital follow-up of CHF.  Last ischemic evaluation was a right/left heart cath in 03/2018 during admission for chest pain and CHF. Cath showed severe native CAD with patent LIMA to LAD but total occlusion of SVG to RCA. RCA territory was being supplied by collaterals from LAD and circumflex. Also noted to have moderate to severe pulmonary hypertension.  Continued medical therapy was recommended.   Patient admitted from 04/17/2020 to 04/25/2020 for acute on chronic CHF after presenting with shortness of breath and bilateral lower extremity edema. BNP elevated at 550. Chest CTA negative for PE. Echo showed LVEF of 40-45% with hypokinesis of the mid and distal anterior septum, basal inferolateral segment, basal inferior segment, and apex as well as grade III diastolic dysfunction. She was diuresed with IV Lasix and then transitioned to Torsemide 40mg  twice daily prior to discharge. Discharge weight was 205 lbs. She was seen by me for hospital follow-up on 05/05/2020 at which time her breathing was stable but she starting to have more lower extremity edema again. Her weight was up 9lbs from discharge on our office sales and up 4lbs on her home  scales. She admitted to eating a lot of takeout food. Torsemide was increased to 60mg  in the morning and 40mg  in the evening for 3 days with instructions to then return to 40mg  twice daily. Patient was advised to follow-up in 1 month.  She presents today for follow-up. ***  Chronic Combined CHF - Echo on 04/20/2020 showed LVEF of 40-45% with hypokinesis of mid and distal anterior septum, basal inferolateral segment, basal inferior segment, and apex as well as abnormal (paradoxical) septal motion consistent with post-op status and grade 3 diastolic dysfunction. Wall motion abnormalities do not apepar to be new. - *** - Continue Torsemide 40mg  twice daily. *** - Continue Losartan 25mg  daily and Toprol-XL 50mg  daily.  - Discussed importance of continuing to weigh daily and limit salt intake. ***  Palpitations/ PVCs - Patient reports fluttering sensation on almost daily basis. ***   CAD  - S/p CABG in 2007 with subsequent inferior STEMI in 10/2015 treated with DES to dSVG-PDA graft.  -No angina. *** - Continue beta-blocker and statin. No Aspirin due to need for Coumadin.  Hypertension - *** - Continue Losartan and Toprol-XL as above.  Hyperlipidemia - Lipid panel from 04/2020: Total cholesterol 221, Triglycerides 165, HDL 71, LDL 121.   - Lipitor increased to 80mg  daily at last visit.  - Repeat lipid panel/LFTs. ***  COPD with Chronic Respiratory Failure - S/p tracheostomy in 2002.  Lupus Anticoagulant Disorder with Prior DVT - On chronic Coumadin. Managed by PCP.    Past Medical History:  Diagnosis Date  . Acute kidney  failure with lesion of tubular necrosis ([place])   . Acute systolic heart failure ([place])   . CAD (coronary artery disease)    a. s/p prior PCI. b. CABG 2007 at [facility] in [place] 2007. c. inferior STEMI 10/2015 s/p DES to dSVG-PDA.  . Cardiac arrest ([place])   . Cervical cancer ([place])   . Chronic diastolic CHF (congestive heart failure) ([place])   .  Chronic respiratory failure ([place])    s/p tracheostomy 2002  . Chronic RUQ pain   . COPD (chronic obstructive pulmonary disease) ([place])   . Diabetes mellitus ([place])   . DVT (deep venous thrombosis) ([place])   . Endometriosis   . History of gallstones 01/2016   seen on Ultrasound  . History of HIDA scan 11/2016   normal  . HTN (hypertension)   . Hyperlipidemia   . Lupus anticoagulant disorder (HCC)    on coumadin  . Morbid obesity ([place])   . ST elevation (STEMI) myocardial infarction involving right coronary artery ([place]) 10/29/15   stent to VG to PDA  . Toe fracture, right 03/29/2018  . Tracheostomy in place [facility]), chronic since 2002 11/03/2015  . Tracheostomy status [facility])     Past Surgical History:  Procedure Laterality Date  . CARDIAC CATHETERIZATION N/A 10/29/2015   Procedure: Left Heart Cath and Cors/Grafts Angiography;  Surgeon: Burnell Blanks, MD;  Location: [place] [facility];  Service: Cardiovascular;  Laterality: N/A;  . CARDIAC CATHETERIZATION  10/29/2015   Procedure: Coronary Stent Intervention;  Surgeon: Burnell Blanks, MD;  Location: [place] [facility];  Service: Cardiovascular;;  . CAROTID STENT    . CESAREAN SECTION WITH BILATERAL TUBAL LIGATION    . CORONARY ARTERY BYPASS GRAFT  2007   2V  . IR GASTROSTOMY TUBE MOD SED  11/13/2018  . RIGHT/LEFT HEART CATH AND CORONARY/GRAFT ANGIOGRAPHY N/A 03/24/2018   Procedure: RIGHT/LEFT HEART CATH AND CORONARY/GRAFT ANGIOGRAPHY;  Surgeon: Belva Crome, MD;  Location: [place] [facility];  Service: Cardiovascular;  Laterality: N/A;  . TRACHEOSTOMY      Current Medications: No outpatient medications have been marked as taking for the 06/11/20 encounter (Appointment) with Darreld Mclean, PA-C.     Allergies:   Penicillins, Ciprofloxacin, and Ibuprofen   Social History   Socioeconomic History  . Marital status: Married    Spouse name: Not on file  . Number of children: Not on file  . Years of education: Not on  file  . Highest education level: Not on file  Occupational History  . Not on file  Tobacco Use  . Smoking status: Current Every Day Smoker    Packs/day: 0.75    Types: Cigarettes    Start date: 10/23/1978  . Smokeless tobacco: Never Used  . Tobacco comment: 1/2 pack - trying to cut back   Vaping Use  . Vaping Use: Never used  Substance and Sexual Activity  . Alcohol use: No    Alcohol/week: 0.0 standard drinks  . Drug use: No  . Sexual activity: Not on file  Other Topics Concern  . Not on file  Social History Narrative  . Not on file   Social Determinants of Health   Financial Resource Strain:   . Difficulty of Paying Living Expenses: Not on file  Food Insecurity:   . Worried About Charity fundraiser in the Last Year: Not on file  . Ran Out of Food in the Last Year: Not on file  Transportation Needs:   .  Lack of Transportation (Medical): Not on file  . Lack of Transportation (Non-Medical): Not on file  Physical Activity:   . Days of Exercise per Week: Not on file  . Minutes of Exercise per Session: Not on file  Stress:   . Feeling of Stress : Not on file  Social Connections:   . Frequency of Communication with Friends and Family: Not on file  . Frequency of Social Gatherings with Friends and Family: Not on file  . Attends Religious Services: Not on file  . Active Member of Clubs or Organizations: Not on file  . Attends Archivist Meetings: Not on file  . Marital Status: Not on file     Family History: The patient's ***family history includes Diabetes in her father and mother; Hypertension in her father and mother.  ROS:   Please see the history of present illness.    *** All other systems reviewed and are negative.  EKGs/Labs/Other Studies Reviewed:    The following studies were reviewed today:  Right/Left Heart Catheterization 03/24/2018:  Severe native coronary artery disease with total occlusion of the proximal LAD, total occlusion of the  proximal RCA, and patent circumflex with patent proximal stent with eccentric 50% proximal narrowing. Distal obtuse marginal branches are severely and diffusely diseased.  Bypass graft failure with total occlusion of SVG to RCA.  Patent LIMA to LAD.  Right coronary territory is supplied by collaterals from LAD and circumflex.  Left ventricular systolic dysfunction with EF 35 to 45%. Mid to distal inferior wall akinesis. Inferobasal and inferoapical wall severely hypokinetic. Left ventricular end-diastolic pressure elevated at 19 mmHg  Moderate to severe pulmonary hypertension  Recommendations:  Resume Coumadin therapy overlap with heparin  Continue Plavix  Management of pulmonary status to maintain adequate oxygenation which may help decrease severity of pulmonary hypertension.  Recommend to resume Warfarin, at currently prescribed dose and frequency, on today. Recommend concurrent antiplatelet therapy of Clopidogrel 75mg  daily for Indefinite. _______________  Echocardiogram 04/20/2020: Impressions: 1. Left ventricular ejection fraction, by estimation, is 40 to 45%. The  left ventricle has mildly decreased function. The left ventricle  demonstrates regional wall motion abnormalities (see scoring  diagram/findings for description). Left ventricular  diastolic parameters are consistent with Grade III diastolic dysfunction  (restrictive). Elevated left atrial pressure.  2. Right ventricular systolic function is normal. The right ventricular  size is normal. There is mildly elevated pulmonary artery systolic  pressure. The estimated right ventricular systolic pressure is 00.1 mmHg.  3. Left atrial size was mildly dilated.  4. Right atrial size was mildly dilated.  5. The mitral valve is degenerative. Mild mitral valve regurgitation.  6. The aortic valve is tricuspid. Aortic valve regurgitation is not  visualized. Mild aortic valve sclerosis is present, with no evidence  of  aortic valve stenosis.  7. The inferior vena cava is dilated in size with <50% respiratory  variability, suggesting right atrial pressure of 15 mmHg.   EKG:  EKG *** ordered today. EKG personally reviewed and demonstrates ***.  Recent Labs: 04/17/2020: B Natriuretic Peptide 550.3 04/18/2020: TSH 1.272 04/20/2020: Hemoglobin 12.1; Platelets 296 05/05/2020: ALT 14; BUN 25; Creatinine, Ser 0.81; Magnesium 2.0; Potassium 4.3; Sodium 140  Recent Lipid Panel    Component Value Date/Time   CHOL 221 (H) 05/05/2020 0928   TRIG 165 (H) 05/05/2020 0928   HDL 71 05/05/2020 0928   CHOLHDL 3.1 05/05/2020 0928   CHOLHDL 3.8 03/24/2018 0451   VLDL 20 03/24/2018 0451   LDLCALC  121 (H) 05/05/2020 5498    Physical Exam:    Vital Signs: There were no vitals taken for this visit.    Wt Readings from Last 3 Encounters:  05/05/20 214 lb 6.4 oz (97.3 kg)  04/25/20 206 lb (93.4 kg)  07/27/19 194 lb (88 kg)     General: 54 y.o. female in no acute distress. HEENT: Normocephalic and atraumatic. Sclera clear. EOMs intact. Neck: Supple. No carotid bruits. No JVD. Heart: *** RRR. Distinct S1 and S2. No murmurs, gallops, or rubs. Radial and distal pedal pulses 2+ and equal bilaterally. Lungs: No increased work of breathing. Clear to ausculation bilaterally. No wheezes, rhonchi, or rales.  Abdomen: Soft, non-distended, and non-tender to palpation. Bowel sounds present in all 4 quadrants.  MSK: Normal strength and tone for age. *** Extremities: No lower extremity edema.    Skin: Warm and dry. Neuro: Alert and oriented x3. No focal deficits. Psych: Normal affect. Responds appropriately.   Assessment:    No diagnosis found.  Plan:     Disposition: Follow up in ***   Medication Adjustments/Labs and Tests Ordered: Current medicines are reviewed at length with the patient today.  Concerns regarding medicines are outlined above.  No orders of the defined types were placed in this encounter.  No  orders of the defined types were placed in this encounter.   There are no Patient Instructions on file for this visit.   Signed, Darreld Mclean, PA-C  06/04/2020 2:03 PM    [place]

## 2020-06-10 DIAGNOSIS — J449 Chronic obstructive pulmonary disease, unspecified: Secondary | ICD-10-CM | POA: Diagnosis not present

## 2020-06-10 DIAGNOSIS — J9610 Chronic respiratory failure, unspecified whether with hypoxia or hypercapnia: Secondary | ICD-10-CM | POA: Diagnosis not present

## 2020-06-10 DIAGNOSIS — I252 Old myocardial infarction: Secondary | ICD-10-CM | POA: Diagnosis not present

## 2020-06-10 DIAGNOSIS — I2510 Atherosclerotic heart disease of native coronary artery without angina pectoris: Secondary | ICD-10-CM | POA: Diagnosis not present

## 2020-06-10 DIAGNOSIS — I5043 Acute on chronic combined systolic (congestive) and diastolic (congestive) heart failure: Secondary | ICD-10-CM | POA: Diagnosis not present

## 2020-06-10 DIAGNOSIS — I110 Hypertensive heart disease with heart failure: Secondary | ICD-10-CM | POA: Diagnosis not present

## 2020-06-11 ENCOUNTER — Ambulatory Visit: Payer: Medicare Other | Admitting: Student

## 2020-06-14 ENCOUNTER — Encounter (HOSPITAL_COMMUNITY): Payer: Self-pay

## 2020-06-14 ENCOUNTER — Inpatient Hospital Stay (HOSPITAL_COMMUNITY)
Admission: EM | Admit: 2020-06-14 | Discharge: 2020-06-20 | DRG: 871 | Disposition: A | Discharge status: Home Health | Payer: Medicare Other | Attending: Internal Medicine | Admitting: Internal Medicine

## 2020-06-14 ENCOUNTER — Other Ambulatory Visit: Payer: Self-pay

## 2020-06-14 DIAGNOSIS — E662 Morbid (severe) obesity with alveolar hypoventilation: Secondary | ICD-10-CM | POA: Diagnosis present

## 2020-06-14 DIAGNOSIS — Z6841 Body Mass Index (BMI) 40.0 and over, adult: Secondary | ICD-10-CM

## 2020-06-14 DIAGNOSIS — D3500 Benign neoplasm of unspecified adrenal gland: Secondary | ICD-10-CM | POA: Diagnosis not present

## 2020-06-14 DIAGNOSIS — I10 Essential (primary) hypertension: Secondary | ICD-10-CM | POA: Diagnosis present

## 2020-06-14 DIAGNOSIS — Z7952 Long term (current) use of systemic steroids: Secondary | ICD-10-CM

## 2020-06-14 DIAGNOSIS — R0689 Other abnormalities of breathing: Secondary | ICD-10-CM | POA: Diagnosis present

## 2020-06-14 DIAGNOSIS — K8050 Calculus of bile duct without cholangitis or cholecystitis without obstruction: Secondary | ICD-10-CM

## 2020-06-14 DIAGNOSIS — J9621 Acute and chronic respiratory failure with hypoxia: Secondary | ICD-10-CM | POA: Diagnosis present

## 2020-06-14 DIAGNOSIS — Z880 Allergy status to penicillin: Secondary | ICD-10-CM

## 2020-06-14 DIAGNOSIS — K8020 Calculus of gallbladder without cholecystitis without obstruction: Secondary | ICD-10-CM | POA: Diagnosis present

## 2020-06-14 DIAGNOSIS — Z833 Family history of diabetes mellitus: Secondary | ICD-10-CM

## 2020-06-14 DIAGNOSIS — Z20822 Contact with and (suspected) exposure to covid-19: Secondary | ICD-10-CM | POA: Diagnosis not present

## 2020-06-14 DIAGNOSIS — I493 Ventricular premature depolarization: Secondary | ICD-10-CM | POA: Diagnosis present

## 2020-06-14 DIAGNOSIS — E1165 Type 2 diabetes mellitus with hyperglycemia: Secondary | ICD-10-CM | POA: Diagnosis not present

## 2020-06-14 DIAGNOSIS — R1084 Generalized abdominal pain: Secondary | ICD-10-CM | POA: Diagnosis not present

## 2020-06-14 DIAGNOSIS — R109 Unspecified abdominal pain: Secondary | ICD-10-CM

## 2020-06-14 DIAGNOSIS — I5042 Chronic combined systolic (congestive) and diastolic (congestive) heart failure: Secondary | ICD-10-CM

## 2020-06-14 DIAGNOSIS — E876 Hypokalemia: Secondary | ICD-10-CM

## 2020-06-14 DIAGNOSIS — Z8674 Personal history of sudden cardiac arrest: Secondary | ICD-10-CM

## 2020-06-14 DIAGNOSIS — Z794 Long term (current) use of insulin: Secondary | ICD-10-CM

## 2020-06-14 DIAGNOSIS — R0902 Hypoxemia: Secondary | ICD-10-CM | POA: Diagnosis not present

## 2020-06-14 DIAGNOSIS — Z955 Presence of coronary angioplasty implant and graft: Secondary | ICD-10-CM

## 2020-06-14 DIAGNOSIS — I080 Rheumatic disorders of both mitral and aortic valves: Secondary | ICD-10-CM | POA: Diagnosis present

## 2020-06-14 DIAGNOSIS — Z930 Tracheostomy status: Secondary | ICD-10-CM

## 2020-06-14 DIAGNOSIS — I5043 Acute on chronic combined systolic (congestive) and diastolic (congestive) heart failure: Secondary | ICD-10-CM | POA: Diagnosis present

## 2020-06-14 DIAGNOSIS — Z8249 Family history of ischemic heart disease and other diseases of the circulatory system: Secondary | ICD-10-CM

## 2020-06-14 DIAGNOSIS — R7881 Bacteremia: Secondary | ICD-10-CM

## 2020-06-14 DIAGNOSIS — I429 Cardiomyopathy, unspecified: Secondary | ICD-10-CM | POA: Diagnosis present

## 2020-06-14 DIAGNOSIS — Z86718 Personal history of other venous thrombosis and embolism: Secondary | ICD-10-CM

## 2020-06-14 DIAGNOSIS — I110 Hypertensive heart disease with heart failure: Secondary | ICD-10-CM | POA: Diagnosis present

## 2020-06-14 DIAGNOSIS — Z888 Allergy status to other drugs, medicaments and biological substances status: Secondary | ICD-10-CM

## 2020-06-14 DIAGNOSIS — Z79899 Other long term (current) drug therapy: Secondary | ICD-10-CM

## 2020-06-14 DIAGNOSIS — R0602 Shortness of breath: Secondary | ICD-10-CM | POA: Diagnosis not present

## 2020-06-14 DIAGNOSIS — Z7901 Long term (current) use of anticoagulants: Secondary | ICD-10-CM

## 2020-06-14 DIAGNOSIS — R7401 Elevation of levels of liver transaminase levels: Secondary | ICD-10-CM | POA: Diagnosis present

## 2020-06-14 DIAGNOSIS — K8010 Calculus of gallbladder with chronic cholecystitis without obstruction: Secondary | ICD-10-CM | POA: Diagnosis not present

## 2020-06-14 DIAGNOSIS — R112 Nausea with vomiting, unspecified: Secondary | ICD-10-CM

## 2020-06-14 DIAGNOSIS — R509 Fever, unspecified: Secondary | ICD-10-CM | POA: Diagnosis not present

## 2020-06-14 DIAGNOSIS — J441 Chronic obstructive pulmonary disease with (acute) exacerbation: Secondary | ICD-10-CM | POA: Diagnosis present

## 2020-06-14 DIAGNOSIS — Z7984 Long term (current) use of oral hypoglycemic drugs: Secondary | ICD-10-CM

## 2020-06-14 DIAGNOSIS — D6862 Lupus anticoagulant syndrome: Secondary | ICD-10-CM | POA: Diagnosis present

## 2020-06-14 DIAGNOSIS — F17210 Nicotine dependence, cigarettes, uncomplicated: Secondary | ICD-10-CM | POA: Diagnosis present

## 2020-06-14 DIAGNOSIS — A4151 Sepsis due to Escherichia coli [E. coli]: Principal | ICD-10-CM | POA: Diagnosis present

## 2020-06-14 DIAGNOSIS — I82409 Acute embolism and thrombosis of unspecified deep veins of unspecified lower extremity: Secondary | ICD-10-CM

## 2020-06-14 DIAGNOSIS — Z8541 Personal history of malignant neoplasm of cervix uteri: Secondary | ICD-10-CM

## 2020-06-14 DIAGNOSIS — R52 Pain, unspecified: Secondary | ICD-10-CM

## 2020-06-14 DIAGNOSIS — I2510 Atherosclerotic heart disease of native coronary artery without angina pectoris: Secondary | ICD-10-CM | POA: Diagnosis present

## 2020-06-14 DIAGNOSIS — E785 Hyperlipidemia, unspecified: Secondary | ICD-10-CM | POA: Diagnosis present

## 2020-06-14 DIAGNOSIS — I491 Atrial premature depolarization: Secondary | ICD-10-CM | POA: Diagnosis not present

## 2020-06-14 DIAGNOSIS — A419 Sepsis, unspecified organism: Secondary | ICD-10-CM

## 2020-06-14 DIAGNOSIS — I517 Cardiomegaly: Secondary | ICD-10-CM | POA: Diagnosis not present

## 2020-06-14 DIAGNOSIS — I252 Old myocardial infarction: Secondary | ICD-10-CM

## 2020-06-14 DIAGNOSIS — R6520 Severe sepsis without septic shock: Secondary | ICD-10-CM | POA: Diagnosis present

## 2020-06-14 DIAGNOSIS — F32A Depression, unspecified: Secondary | ICD-10-CM | POA: Diagnosis present

## 2020-06-14 DIAGNOSIS — E119 Type 2 diabetes mellitus without complications: Secondary | ICD-10-CM

## 2020-06-14 DIAGNOSIS — I700 Atherosclerosis of aorta: Secondary | ICD-10-CM | POA: Diagnosis not present

## 2020-06-14 DIAGNOSIS — K8011 Calculus of gallbladder with chronic cholecystitis with obstruction: Secondary | ICD-10-CM | POA: Diagnosis not present

## 2020-06-14 HISTORY — DX: Psoriasis, unspecified: L40.9

## 2020-06-14 NOTE — ED Triage Notes (Signed)
Pt brought in by EMS with c/o abd pain with hx of recurrent gallstones that are inoperable due to "weak heart". Pt reports eating corn dog and onion rings prior to abd pain. Pt also reports some shortness of breath with hx of COPD and CHF. Pt on 5 L oxygen at home.

## 2020-06-15 ENCOUNTER — Emergency Department (HOSPITAL_COMMUNITY): Payer: Medicare Other

## 2020-06-15 ENCOUNTER — Inpatient Hospital Stay (HOSPITAL_COMMUNITY): Payer: Medicare Other

## 2020-06-15 DIAGNOSIS — Z8541 Personal history of malignant neoplasm of cervix uteri: Secondary | ICD-10-CM | POA: Diagnosis not present

## 2020-06-15 DIAGNOSIS — I429 Cardiomyopathy, unspecified: Secondary | ICD-10-CM | POA: Diagnosis present

## 2020-06-15 DIAGNOSIS — J9611 Chronic respiratory failure with hypoxia: Secondary | ICD-10-CM | POA: Diagnosis not present

## 2020-06-15 DIAGNOSIS — E662 Morbid (severe) obesity with alveolar hypoventilation: Secondary | ICD-10-CM | POA: Diagnosis present

## 2020-06-15 DIAGNOSIS — Z8249 Family history of ischemic heart disease and other diseases of the circulatory system: Secondary | ICD-10-CM | POA: Diagnosis not present

## 2020-06-15 DIAGNOSIS — I10 Essential (primary) hypertension: Secondary | ICD-10-CM | POA: Diagnosis not present

## 2020-06-15 DIAGNOSIS — K8020 Calculus of gallbladder without cholecystitis without obstruction: Secondary | ICD-10-CM | POA: Diagnosis not present

## 2020-06-15 DIAGNOSIS — R1084 Generalized abdominal pain: Secondary | ICD-10-CM | POA: Diagnosis not present

## 2020-06-15 DIAGNOSIS — F17210 Nicotine dependence, cigarettes, uncomplicated: Secondary | ICD-10-CM

## 2020-06-15 DIAGNOSIS — I5043 Acute on chronic combined systolic (congestive) and diastolic (congestive) heart failure: Secondary | ICD-10-CM | POA: Diagnosis not present

## 2020-06-15 DIAGNOSIS — E876 Hypokalemia: Secondary | ICD-10-CM

## 2020-06-15 DIAGNOSIS — K8010 Calculus of gallbladder with chronic cholecystitis without obstruction: Secondary | ICD-10-CM | POA: Diagnosis present

## 2020-06-15 DIAGNOSIS — A4151 Sepsis due to Escherichia coli [E. coli]: Secondary | ICD-10-CM | POA: Diagnosis not present

## 2020-06-15 DIAGNOSIS — R1011 Right upper quadrant pain: Secondary | ICD-10-CM

## 2020-06-15 DIAGNOSIS — R7881 Bacteremia: Secondary | ICD-10-CM | POA: Diagnosis not present

## 2020-06-15 DIAGNOSIS — E785 Hyperlipidemia, unspecified: Secondary | ICD-10-CM | POA: Diagnosis present

## 2020-06-15 DIAGNOSIS — I5042 Chronic combined systolic (congestive) and diastolic (congestive) heart failure: Secondary | ICD-10-CM | POA: Diagnosis not present

## 2020-06-15 DIAGNOSIS — R109 Unspecified abdominal pain: Secondary | ICD-10-CM

## 2020-06-15 DIAGNOSIS — I2510 Atherosclerotic heart disease of native coronary artery without angina pectoris: Secondary | ICD-10-CM | POA: Diagnosis present

## 2020-06-15 DIAGNOSIS — R7401 Elevation of levels of liver transaminase levels: Secondary | ICD-10-CM

## 2020-06-15 DIAGNOSIS — E1165 Type 2 diabetes mellitus with hyperglycemia: Secondary | ICD-10-CM | POA: Diagnosis not present

## 2020-06-15 DIAGNOSIS — F32A Depression, unspecified: Secondary | ICD-10-CM | POA: Diagnosis present

## 2020-06-15 DIAGNOSIS — Z930 Tracheostomy status: Secondary | ICD-10-CM

## 2020-06-15 DIAGNOSIS — K8050 Calculus of bile duct without cholangitis or cholecystitis without obstruction: Secondary | ICD-10-CM | POA: Diagnosis not present

## 2020-06-15 DIAGNOSIS — I110 Hypertensive heart disease with heart failure: Secondary | ICD-10-CM | POA: Diagnosis present

## 2020-06-15 DIAGNOSIS — K8000 Calculus of gallbladder with acute cholecystitis without obstruction: Secondary | ICD-10-CM | POA: Diagnosis not present

## 2020-06-15 DIAGNOSIS — E1140 Type 2 diabetes mellitus with diabetic neuropathy, unspecified: Secondary | ICD-10-CM | POA: Diagnosis not present

## 2020-06-15 DIAGNOSIS — Z794 Long term (current) use of insulin: Secondary | ICD-10-CM | POA: Diagnosis not present

## 2020-06-15 DIAGNOSIS — Z833 Family history of diabetes mellitus: Secondary | ICD-10-CM | POA: Diagnosis not present

## 2020-06-15 DIAGNOSIS — Z880 Allergy status to penicillin: Secondary | ICD-10-CM | POA: Diagnosis not present

## 2020-06-15 DIAGNOSIS — J441 Chronic obstructive pulmonary disease with (acute) exacerbation: Secondary | ICD-10-CM | POA: Diagnosis present

## 2020-06-15 DIAGNOSIS — R0689 Other abnormalities of breathing: Secondary | ICD-10-CM

## 2020-06-15 DIAGNOSIS — I700 Atherosclerosis of aorta: Secondary | ICD-10-CM | POA: Diagnosis not present

## 2020-06-15 DIAGNOSIS — A419 Sepsis, unspecified organism: Secondary | ICD-10-CM | POA: Diagnosis not present

## 2020-06-15 DIAGNOSIS — Z955 Presence of coronary angioplasty implant and graft: Secondary | ICD-10-CM | POA: Diagnosis not present

## 2020-06-15 DIAGNOSIS — Z20822 Contact with and (suspected) exposure to covid-19: Secondary | ICD-10-CM | POA: Diagnosis present

## 2020-06-15 DIAGNOSIS — R112 Nausea with vomiting, unspecified: Secondary | ICD-10-CM

## 2020-06-15 DIAGNOSIS — I252 Old myocardial infarction: Secondary | ICD-10-CM | POA: Diagnosis not present

## 2020-06-15 DIAGNOSIS — Z6841 Body Mass Index (BMI) 40.0 and over, adult: Secondary | ICD-10-CM | POA: Diagnosis not present

## 2020-06-15 DIAGNOSIS — D6862 Lupus anticoagulant syndrome: Secondary | ICD-10-CM | POA: Diagnosis not present

## 2020-06-15 DIAGNOSIS — R6520 Severe sepsis without septic shock: Secondary | ICD-10-CM | POA: Diagnosis present

## 2020-06-15 DIAGNOSIS — Z7901 Long term (current) use of anticoagulants: Secondary | ICD-10-CM | POA: Diagnosis not present

## 2020-06-15 DIAGNOSIS — R0602 Shortness of breath: Secondary | ICD-10-CM | POA: Diagnosis not present

## 2020-06-15 DIAGNOSIS — J9621 Acute and chronic respiratory failure with hypoxia: Secondary | ICD-10-CM

## 2020-06-15 DIAGNOSIS — Z888 Allergy status to other drugs, medicaments and biological substances status: Secondary | ICD-10-CM | POA: Diagnosis not present

## 2020-06-15 DIAGNOSIS — I517 Cardiomegaly: Secondary | ICD-10-CM | POA: Diagnosis not present

## 2020-06-15 DIAGNOSIS — D3500 Benign neoplasm of unspecified adrenal gland: Secondary | ICD-10-CM | POA: Diagnosis not present

## 2020-06-15 DIAGNOSIS — Z86718 Personal history of other venous thrombosis and embolism: Secondary | ICD-10-CM | POA: Diagnosis not present

## 2020-06-15 LAB — URINALYSIS, ROUTINE W REFLEX MICROSCOPIC
Bacteria, UA: NONE SEEN
Glucose, UA: NEGATIVE mg/dL
Hgb urine dipstick: NEGATIVE
Ketones, ur: NEGATIVE mg/dL
Leukocytes,Ua: NEGATIVE
Nitrite: NEGATIVE
Protein, ur: 30 mg/dL — AB
Specific Gravity, Urine: 1.014 (ref 1.005–1.030)
pH: 6 (ref 5.0–8.0)

## 2020-06-15 LAB — COMPREHENSIVE METABOLIC PANEL
ALT: 163 U/L — ABNORMAL HIGH (ref 0–44)
AST: 485 U/L — ABNORMAL HIGH (ref 15–41)
Albumin: 3 g/dL — ABNORMAL LOW (ref 3.5–5.0)
Alkaline Phosphatase: 232 U/L — ABNORMAL HIGH (ref 38–126)
Anion gap: 14 (ref 5–15)
BUN: 13 mg/dL (ref 6–20)
CO2: 36 mmol/L — ABNORMAL HIGH (ref 22–32)
Calcium: 8.5 mg/dL — ABNORMAL LOW (ref 8.9–10.3)
Chloride: 88 mmol/L — ABNORMAL LOW (ref 98–111)
Creatinine, Ser: 0.9 mg/dL (ref 0.44–1.00)
GFR, Estimated: >60 mL/min (ref >60)
Glucose, Bld: 232 mg/dL — ABNORMAL HIGH (ref 70–99)
Potassium: 2.8 mmol/L — ABNORMAL LOW (ref 3.5–5.1)
Sodium: 138 mmol/L (ref 135–145)
Total Bilirubin: 3 mg/dL — ABNORMAL HIGH (ref 0.3–1.2)
Total Protein: 6.9 g/dL (ref 6.5–8.1)

## 2020-06-15 LAB — PROTIME-INR
INR: 2 — ABNORMAL HIGH (ref 0.8–1.2)
Prothrombin Time: 22.1 seconds — ABNORMAL HIGH (ref 11.4–15.2)

## 2020-06-15 LAB — CBC WITH DIFFERENTIAL/PLATELET
Abs Immature Granulocytes: 0.08 10*3/uL — ABNORMAL HIGH (ref 0.00–0.07)
Basophils Absolute: 0 10*3/uL (ref 0.0–0.1)
Basophils Relative: 0 %
Eosinophils Absolute: 0 10*3/uL (ref 0.0–0.5)
Eosinophils Relative: 0 %
HCT: 38 % (ref 36.0–46.0)
Hemoglobin: 12.2 g/dL (ref 12.0–15.0)
Immature Granulocytes: 1 %
Lymphocytes Relative: 3 %
Lymphs Abs: 0.4 10*3/uL — ABNORMAL LOW (ref 0.7–4.0)
MCH: 29.9 pg (ref 26.0–34.0)
MCHC: 32.1 g/dL (ref 30.0–36.0)
MCV: 93.1 fL (ref 80.0–100.0)
Monocytes Absolute: 0.7 10*3/uL (ref 0.1–1.0)
Monocytes Relative: 5 %
Neutro Abs: 13.6 10*3/uL — ABNORMAL HIGH (ref 1.7–7.7)
Neutrophils Relative %: 91 %
Platelets: 247 10*3/uL (ref 150–400)
RBC: 4.08 MIL/uL (ref 3.87–5.11)
RDW: 14.8 % (ref 11.5–15.5)
WBC: 14.8 10*3/uL — ABNORMAL HIGH (ref 4.0–10.5)
nRBC: 0 % (ref 0.0–0.2)

## 2020-06-15 LAB — RESPIRATORY PANEL BY RT PCR (FLU A&B, COVID)
Influenza A by PCR: NEGATIVE
Influenza B by PCR: NEGATIVE
SARS Coronavirus 2 by RT PCR: NEGATIVE

## 2020-06-15 LAB — GLUCOSE, CAPILLARY: Glucose-Capillary: 350 mg/dL — ABNORMAL HIGH (ref 70–99)

## 2020-06-15 LAB — CBG MONITORING, ED
Glucose-Capillary: 190 mg/dL — ABNORMAL HIGH (ref 70–99)
Glucose-Capillary: 202 mg/dL — ABNORMAL HIGH (ref 70–99)
Glucose-Capillary: 300 mg/dL — ABNORMAL HIGH (ref 70–99)

## 2020-06-15 LAB — TYPE AND SCREEN
ABO/RH(D): O POS
Antibody Screen: NEGATIVE

## 2020-06-15 LAB — LACTIC ACID, PLASMA
Lactic Acid, Venous: 2.8 mmol/L (ref 0.5–1.9)
Lactic Acid, Venous: 2.6 mmol/L (ref 0.5–1.9)

## 2020-06-15 LAB — LIPASE, BLOOD: Lipase: 25 U/L (ref 11–51)

## 2020-06-15 LAB — APTT: aPTT: 44 seconds — ABNORMAL HIGH (ref 24–36)

## 2020-06-15 MED ORDER — LACTATED RINGERS IV BOLUS (SEPSIS)
1000.0000 mL | Freq: Once | INTRAVENOUS | Status: AC
  Administered 2020-06-15: 1000 mL via INTRAVENOUS

## 2020-06-15 MED ORDER — METHYLPREDNISOLONE SODIUM SUCC 125 MG IJ SOLR
60.0000 mg | Freq: Two times a day (BID) | INTRAMUSCULAR | Status: DC
  Administered 2020-06-15 – 2020-06-17 (×5): 60 mg via INTRAVENOUS
  Filled 2020-06-15 (×5): qty 2

## 2020-06-15 MED ORDER — SODIUM CHLORIDE 0.9 % IV SOLN
2.0000 g | Freq: Three times a day (TID) | INTRAVENOUS | Status: DC
  Administered 2020-06-15 – 2020-06-17 (×4): 2 g via INTRAVENOUS
  Filled 2020-06-15 (×5): qty 2

## 2020-06-15 MED ORDER — PANTOPRAZOLE SODIUM 40 MG PO TBEC: 40.0000 mg | DELAYED_RELEASE_TABLET | Freq: Every day | ORAL | Status: DC

## 2020-06-15 MED ORDER — INSULIN ASPART 100 UNIT/ML ~~LOC~~ SOLN
0.0000 [IU] | Freq: Every day | SUBCUTANEOUS | Status: DC
  Administered 2020-06-15: 4 [IU] via SUBCUTANEOUS
  Administered 2020-06-16 – 2020-06-17 (×2): 2 [IU] via SUBCUTANEOUS
  Administered 2020-06-18: 5 [IU] via SUBCUTANEOUS
  Administered 2020-06-19: 4 [IU] via SUBCUTANEOUS
  Filled 2020-06-15: qty 1

## 2020-06-15 MED ORDER — METRONIDAZOLE IN NACL 5-0.79 MG/ML-% IV SOLN
500.0000 mg | Freq: Three times a day (TID) | INTRAVENOUS | Status: DC
  Administered 2020-06-15 – 2020-06-20 (×15): 500 mg via INTRAVENOUS
  Filled 2020-06-15 (×16): qty 100

## 2020-06-15 MED ORDER — POTASSIUM CHLORIDE 10 MEQ/100ML IV SOLN
10.0000 meq | INTRAVENOUS | Status: AC
  Administered 2020-06-15 (×2): 10 meq via INTRAVENOUS
  Filled 2020-06-15 (×2): qty 100

## 2020-06-15 MED ORDER — INSULIN GLARGINE 100 UNIT/ML ~~LOC~~ SOLN
10.0000 [IU] | Freq: Every day | SUBCUTANEOUS | Status: DC
  Administered 2020-06-15 – 2020-06-16 (×2): 10 [IU] via SUBCUTANEOUS
  Filled 2020-06-15 (×3): qty 0.1

## 2020-06-15 MED ORDER — WARFARIN SODIUM 2.5 MG PO TABS: 2.5000 mg | ORAL_TABLET | ORAL | Status: DC

## 2020-06-15 MED ORDER — LACTATED RINGERS IV SOLN: INTRAVENOUS | Status: AC

## 2020-06-15 MED ORDER — SODIUM CHLORIDE 0.9 % IV SOLN
1.0000 g | Freq: Three times a day (TID) | INTRAVENOUS | Status: DC
  Filled 2020-06-15 (×4): qty 1

## 2020-06-15 MED ORDER — INFLUENZA VAC SPLIT QUAD 0.5 ML IM SUSY
0.5000 mL | PREFILLED_SYRINGE | INTRAMUSCULAR | Status: DC
  Filled 2020-06-15: qty 0.5

## 2020-06-15 MED ORDER — POTASSIUM CHLORIDE 10 MEQ/100ML IV SOLN
10.0000 meq | Freq: Once | INTRAVENOUS | Status: AC
  Administered 2020-06-15: 10 meq via INTRAVENOUS
  Filled 2020-06-15: qty 100

## 2020-06-15 MED ORDER — TRAMADOL HCL 50 MG PO TABS
50.0000 mg | ORAL_TABLET | Freq: Four times a day (QID) | ORAL | Status: DC | PRN
  Administered 2020-06-15 – 2020-06-19 (×4): 50 mg via ORAL
  Filled 2020-06-15 (×4): qty 1

## 2020-06-15 MED ORDER — METRONIDAZOLE IN NACL 5-0.79 MG/ML-% IV SOLN
500.0000 mg | Freq: Once | INTRAVENOUS | Status: AC
  Administered 2020-06-15: 500 mg via INTRAVENOUS
  Filled 2020-06-15: qty 100

## 2020-06-15 MED ORDER — PANTOPRAZOLE SODIUM 20 MG PO TBEC
20.0000 mg | DELAYED_RELEASE_TABLET | Freq: Every day | ORAL | Status: DC
  Filled 2020-06-15 (×2): qty 1

## 2020-06-15 MED ORDER — ONDANSETRON HCL 4 MG/2ML IJ SOLN
4.0000 mg | Freq: Once | INTRAMUSCULAR | Status: AC
  Administered 2020-06-15: 4 mg via INTRAVENOUS
  Filled 2020-06-15: qty 2

## 2020-06-15 MED ORDER — INSULIN ASPART 100 UNIT/ML ~~LOC~~ SOLN
0.0000 [IU] | Freq: Three times a day (TID) | SUBCUTANEOUS | Status: DC
  Administered 2020-06-15: 3 [IU] via SUBCUTANEOUS
  Administered 2020-06-15: 5 [IU] via SUBCUTANEOUS
  Administered 2020-06-16: 15 [IU] via SUBCUTANEOUS
  Administered 2020-06-16: 5 [IU] via SUBCUTANEOUS
  Administered 2020-06-16: 15 [IU] via SUBCUTANEOUS
  Administered 2020-06-17: 3 [IU] via SUBCUTANEOUS
  Administered 2020-06-17 (×2): 8 [IU] via SUBCUTANEOUS
  Administered 2020-06-18: 2 [IU] via SUBCUTANEOUS
  Administered 2020-06-18: 15 [IU] via SUBCUTANEOUS
  Administered 2020-06-19: 3 [IU] via SUBCUTANEOUS
  Administered 2020-06-19: 8 [IU] via SUBCUTANEOUS
  Administered 2020-06-20: 15 [IU] via SUBCUTANEOUS
  Administered 2020-06-20: 5 [IU] via SUBCUTANEOUS
  Filled 2020-06-15: qty 1

## 2020-06-15 MED ORDER — PANTOPRAZOLE SODIUM 40 MG IV SOLR
40.0000 mg | Freq: Two times a day (BID) | INTRAVENOUS | Status: DC
  Administered 2020-06-15 – 2020-06-17 (×6): 40 mg via INTRAVENOUS
  Filled 2020-06-15 (×7): qty 40

## 2020-06-15 MED ORDER — POTASSIUM CHLORIDE CRYS ER 20 MEQ PO TBCR
40.0000 meq | EXTENDED_RELEASE_TABLET | Freq: Once | ORAL | Status: AC
  Administered 2020-06-15: 40 meq via ORAL
  Filled 2020-06-15: qty 2

## 2020-06-15 MED ORDER — IPRATROPIUM-ALBUTEROL 0.5-2.5 (3) MG/3ML IN SOLN
3.0000 mL | Freq: Four times a day (QID) | RESPIRATORY_TRACT | Status: DC
  Administered 2020-06-15 – 2020-06-17 (×10): 3 mL via RESPIRATORY_TRACT
  Filled 2020-06-15 (×9): qty 3

## 2020-06-15 MED ORDER — TORSEMIDE 20 MG PO TABS
40.0000 mg | ORAL_TABLET | Freq: Every day | ORAL | Status: DC
  Administered 2020-06-15 – 2020-06-16 (×2): 40 mg via ORAL
  Filled 2020-06-15 (×3): qty 2

## 2020-06-15 MED ORDER — IOHEXOL 300 MG/ML  SOLN
100.0000 mL | Freq: Once | INTRAMUSCULAR | Status: AC | PRN
  Administered 2020-06-15: 100 mL via INTRAVENOUS

## 2020-06-15 MED ORDER — ALBUTEROL SULFATE HFA 108 (90 BASE) MCG/ACT IN AERS: 2.0000 | INHALATION_SPRAY | RESPIRATORY_TRACT | Status: DC | PRN

## 2020-06-15 MED ORDER — ALBUTEROL SULFATE (2.5 MG/3ML) 0.083% IN NEBU
2.5000 mg | INHALATION_SOLUTION | RESPIRATORY_TRACT | Status: DC | PRN
  Filled 2020-06-15: qty 3

## 2020-06-15 MED ORDER — BUDESONIDE 0.5 MG/2ML IN SUSP
0.5000 mg | Freq: Two times a day (BID) | RESPIRATORY_TRACT | Status: DC
  Administered 2020-06-15 – 2020-06-20 (×11): 0.5 mg via RESPIRATORY_TRACT
  Filled 2020-06-15 (×11): qty 2

## 2020-06-15 MED ORDER — LACTATED RINGERS IV BOLUS (SEPSIS): 1000.0000 mL | Freq: Once | INTRAVENOUS | Status: DC

## 2020-06-15 MED ORDER — SODIUM CHLORIDE 0.9 % IV SOLN
2.0000 g | Freq: Once | INTRAVENOUS | Status: AC
  Administered 2020-06-15: 2 g via INTRAVENOUS
  Filled 2020-06-15: qty 2

## 2020-06-15 MED ORDER — FENTANYL CITRATE (PF) 100 MCG/2ML IJ SOLN
50.0000 ug | Freq: Once | INTRAMUSCULAR | Status: AC
  Administered 2020-06-15: 50 ug via INTRAVENOUS
  Filled 2020-06-15: qty 2

## 2020-06-15 NOTE — Progress Notes (Signed)
ANTICOAGULATION CONSULT NOTE  Pharmacy Consult for:  heparin dosing Indication: lupus anticoagulant syndrome, hx STEMI   Patient Measurements: Height: 4\' 10"  (147.3 cm) Weight: 99.8 kg (220 lb) IBW/kg (Calculated) : 40.9 Heparin Dosing Weight: HEPARIN DW (KG): 65.7  Vital Signs: Temp: 100.7 F (38.2 C) (10/09 2352) Temp Source: Oral (10/09 2352) BP: 106/65 (10/10 0550) Pulse Rate: 88 (10/10 0550)  Labs: Recent Labs    06/15/20 0014  HGB 12.2  HCT 38.0  PLT 247  APTT 44*  LABPROT 22.1*  INR 2.0*  CREATININE 0.90    Estimated Creatinine Clearance: 72.8 mL/min (by C-G formula based on SCr of 0.9 mg/dL).   Medical History: Past Medical History:  Diagnosis Date  . Acute kidney failure with lesion of tubular necrosis (HCC)   . Acute systolic heart failure ([place])   . CAD (coronary artery disease)    a. s/p prior PCI. b. CABG 2007 at [facility] in [place] 2007. c. inferior STEMI 10/2015 s/p DES to dSVG-PDA.  . Cardiac arrest ([place])   . Cervical cancer ([place])   . Chronic diastolic CHF (congestive heart failure) ([place])   . Chronic respiratory failure ([place])    s/p tracheostomy 2002  . Chronic RUQ pain   . COPD (chronic obstructive pulmonary disease) ([place])   . Diabetes mellitus ([place])   . DVT (deep venous thrombosis) ([place])   . Endometriosis   . History of gallstones 01/2016   seen on Ultrasound  . History of HIDA scan 11/2016   normal  . HTN (hypertension)   . Hyperlipidemia   . Lupus anticoagulant disorder (HCC)    on coumadin  . Morbid obesity ([place])   . ST elevation (STEMI) myocardial infarction involving right coronary artery ([place]) 10/29/15   stent to VG to PDA  . Toe fracture, right 03/29/2018  . Tracheostomy in place [facility]), chronic since 2002 11/03/2015  . Tracheostomy status [facility])      Assessment: Pharmacy consulted to dose heparin infusion for this 54 yo female on chronic warfarin for lupus anti-coagulant syndrome.  Patient has  history of CAD and STEMI.   Baseline CBC shows Hb of 12.2mg /dL.  CXR shows no definite acute cardiopulmonary process.  Patient may need procedure for acute cholecystitis.  Patient took her last dose of warfarin 2.5mg  on 06/13/20 and her current INR is 2.0.  Home dose of warfarin:  5mg  on Mon-Wed-Fri   and   2.5mg  on Tues/Thurs/Sat/Sun  Goal of Therapy:  Heparin level 0.3-0.7 units/ml Monitor platelets by anticoagulation protocol: Yes   Plan:  Start heparin infusion at 900 units/hr when INR is <2.0 Check anti-Xa level in 6-8 hours and daily while on heparin Continue to monitor H&H and platelets    [name] 06/15/2020,10:43 AM

## 2020-06-15 NOTE — Progress Notes (Signed)
1505 Inner cannula cleaned, trach suctioned.

## 2020-06-15 NOTE — Consult Note (Signed)
Reason for Consult: Cholelithiasis Referring Physician: Dr. Miles Costain is an 54 y.o. female.  HPI: Patient is a 54 year old white female with multiple medical problems including chronic respiratory failure, chronic congestive heart disease, status post tracheostomy in 2002, COPD, and history of chronic right upper quadrant abdominal pain who presented to the emergency room with a 24-hour history of worsening abdominal pain.  She has also noticed this increase in secretions from her tracheostomy.  A CT scan of the abdomen was performed which revealed cholelithiasis without evidence of cholecystitis.  She does have transaminitis as well as an elevated total bilirubin.  She was noted to have an elevated lactic acid level and borderline hypotension.  Past Medical History:  Diagnosis Date  . Acute kidney failure with lesion of tubular necrosis (HCC)   . Acute systolic heart failure ([place])   . CAD (coronary artery disease)    a. s/p prior PCI. b. CABG 2007 at [facility] in [place] 2007. c. inferior STEMI 10/2015 s/p DES to dSVG-PDA.  . Cardiac arrest ([place])   . Cervical cancer ([place])   . Chronic diastolic CHF (congestive heart failure) ([place])   . Chronic respiratory failure ([place])    s/p tracheostomy 2002  . Chronic RUQ pain   . COPD (chronic obstructive pulmonary disease) ([place])   . Diabetes mellitus ([place])   . DVT (deep venous thrombosis) ([place])   . Endometriosis   . History of gallstones 01/2016   seen on Ultrasound  . History of HIDA scan 11/2016   normal  . HTN (hypertension)   . Hyperlipidemia   . Lupus anticoagulant disorder (HCC)    on coumadin  . Morbid obesity ([place])   . ST elevation (STEMI) myocardial infarction involving right coronary artery ([place]) 10/29/15   stent to VG to PDA  . Toe fracture, right 03/29/2018  . Tracheostomy in place [facility]), chronic since 2002 11/03/2015  . Tracheostomy status [facility])     Past Surgical History:  Procedure Laterality Date  . CARDIAC  CATHETERIZATION N/A 10/29/2015   Procedure: Left Heart Cath and Cors/Grafts Angiography;  Surgeon: Burnell Blanks, MD;  Location: [facility];  Service: Cardiovascular;  Laterality: N/A;  . CARDIAC CATHETERIZATION  10/29/2015   Procedure: Coronary Stent Intervention;  Surgeon: Burnell Blanks, MD;  Location: [facility];  Service: Cardiovascular;;  . CAROTID STENT    . CESAREAN SECTION WITH BILATERAL TUBAL LIGATION    . CORONARY ARTERY BYPASS GRAFT  2007   2V  . IR GASTROSTOMY TUBE MOD SED  11/13/2018  . RIGHT/LEFT HEART CATH AND CORONARY/GRAFT ANGIOGRAPHY N/A 03/24/2018   Procedure: RIGHT/LEFT HEART CATH AND CORONARY/GRAFT ANGIOGRAPHY;  Surgeon: Belva Crome, MD;  Location: [facility];  Service: Cardiovascular;  Laterality: N/A;  . TRACHEOSTOMY      Family History  Problem Relation Age of Onset  . Hypertension Mother   . Diabetes Mother   . Hypertension Father   . Diabetes Father     Social History:  reports that she has been smoking cigarettes. She started smoking about 41 years ago. She has been smoking about 0.75 packs per day. She has never used smokeless tobacco. She reports that she does not drink alcohol and does not use drugs.  Allergies:  Allergies  Allergen Reactions  . Penicillins Rash    Has patient had a PCN reaction causing immediate rash, facial/tongue/throat swelling, SOB or lightheadedness with hypotension: Yes Has patient had a PCN reaction causing  severe rash involving mucus membranes or skin necrosis: No Has patient had a PCN reaction that required hospitalization No Has patient had a PCN reaction occurring within the last 10 years: No If all of the above answers are "NO", then may proceed with Cephalosporin use.   REACTION: rash Pt has tolerated cefepime in the past.  . Ciprofloxacin     nausea  . Ibuprofen Rash    swelling in leg     Medications: I have reviewed the patient's current medications.  Results for  orders placed or performed during the hospital encounter of 06/14/20 (from the past 48 hour(s))  CBC with Differential/Platelet     Status: Abnormal   Collection Time: 06/15/20 12:14 AM  Result Value Ref Range   WBC 14.8 (H) 4.0 - 10.5 K/uL   RBC 4.08 3.87 - 5.11 MIL/uL   Hemoglobin 12.2 12.0 - 15.0 g/dL   HCT 38.0 36 - 46 %   MCV 93.1 80.0 - 100.0 fL   MCH 29.9 26.0 - 34.0 pg   MCHC 32.1 30.0 - 36.0 g/dL   RDW 14.8 11.5 - 15.5 %   Platelets 247 150 - 400 K/uL   nRBC 0.0 0.0 - 0.2 %   Neutrophils Relative % 91 %   Neutro Abs 13.6 (H) 1.7 - 7.7 K/uL   Lymphocytes Relative 3 %   Lymphs Abs 0.4 (L) 0.7 - 4.0 K/uL   Monocytes Relative 5 %   Monocytes Absolute 0.7 0.1 - 1.0 K/uL   Eosinophils Relative 0 %   Eosinophils Absolute 0.0 0 - 0 K/uL   Basophils Relative 0 %   Basophils Absolute 0.0 0 - 0 K/uL   Immature Granulocytes 1 %   Abs Immature Granulocytes 0.08 (H) 0.00 - 0.07 K/uL    Comment: Performed at [facility], [address], [place], [place] [address]  Comprehensive metabolic panel     Status: Abnormal   Collection Time: 06/15/20 12:14 AM  Result Value Ref Range   Sodium 138 135 - 145 mmol/L   Potassium 2.8 (L) 3.5 - 5.1 mmol/L   Chloride 88 (L) 98 - 111 mmol/L   CO2 36 (H) 22 - 32 mmol/L   Glucose, Bld 232 (H) 70 - 99 mg/dL    Comment: Glucose reference range applies only to samples taken after fasting for at least 8 hours.   BUN 13 6 - 20 mg/dL   Creatinine, Ser 0.90 0.44 - 1.00 mg/dL   Calcium 8.5 (L) 8.9 - 10.3 mg/dL   Total Protein 6.9 6.5 - 8.1 g/dL   Albumin 3.0 (L) 3.5 - 5.0 g/dL   AST 485 (H) 15 - 41 U/L   ALT 163 (H) 0 - 44 U/L   Alkaline Phosphatase 232 (H) 38 - 126 U/L   Total Bilirubin 3.0 (H) 0.3 - 1.2 mg/dL   GFR, Estimated >60 >60 mL/min   Anion gap 14 5 - 15    Comment: Performed at [facility], [address] [place], [place] [address]  Lipase, blood     Status: None   Collection Time: 06/15/20 12:14 AM  Result Value Ref Range   Lipase 25 11  - 51 U/L    Comment: Performed at [facility], [address], [place], [place] [address]  Protime-INR     Status: Abnormal   Collection Time: 06/15/20 12:14 AM  Result Value Ref Range   Prothrombin Time 22.1 (H) 11.4 - 15.2 seconds   INR 2.0 (H) 0.8 - 1.2  Comment: (NOTE) INR goal varies based on device and disease states. Performed at [facility], [address] [place], [place] [address]   Lactic acid, plasma     Status: Abnormal   Collection Time: 06/15/20 12:14 AM  Result Value Ref Range   Lactic Acid, Venous 2.8 (HH) 0.5 - 1.9 mmol/L    Comment: CRITICAL RESULT CALLED TO, READ BACK BY AND VERIFIED WITH: TURNER,C @ 0131 ON 06/15/20 BY JUW Performed at [facility], [address], [place], [place] [address]   APTT     Status: Abnormal   Collection Time: 06/15/20 12:14 AM  Result Value Ref Range   aPTT 44 (H) 24 - 36 seconds    Comment:        IF BASELINE aPTT IS ELEVATED, SUGGEST PATIENT RISK ASSESSMENT BE USED TO DETERMINE APPROPRIATE ANTICOAGULANT THERAPY. Performed at [facility], [address], [place], [place] [address]   Respiratory Panel by RT PCR (Flu A&B, Covid) - Nasopharyngeal Swab     Status: None   Collection Time: 06/15/20 12:31 AM   Specimen: Nasopharyngeal Swab  Result Value Ref Range   SARS Coronavirus 2 by RT PCR NEGATIVE NEGATIVE    Comment: (NOTE) SARS-CoV-2 target nucleic acids are NOT DETECTED.  The SARS-CoV-2 RNA is generally detectable in upper respiratoy specimens during the acute phase of infection. The lowest concentration of SARS-CoV-2 viral copies this assay can detect is 131 copies/mL. A negative result does not preclude SARS-Cov-2 infection and should not be used as the sole basis for treatment or other patient management decisions. A negative result may occur with  improper specimen collection/handling, submission of specimen other than nasopharyngeal swab, presence of viral mutation(s) within the areas targeted by this assay, and  inadequate number of viral copies (<131 copies/mL). A negative result must be combined with clinical observations, patient history, and epidemiological information. The expected result is Negative.  Fact Sheet for Patients:  PinkCheek.be  Fact Sheet for Healthcare Providers:  GravelBags.it  This test is no t yet approved or cleared by the [place] FDA and  has been authorized for detection and/or diagnosis of SARS-CoV-2 by FDA under an Emergency Use Authorization (EUA). This EUA will remain  in effect (meaning this test can be used) for the duration of the COVID-19 declaration under Section 564(b)(1) of the Act, 21 U.S.C. section 360bbb-3(b)(1), unless the authorization is terminated or revoked sooner.     Influenza A by PCR NEGATIVE NEGATIVE   Influenza B by PCR NEGATIVE NEGATIVE    Comment: (NOTE) The Xpert Xpress SARS-CoV-2/FLU/RSV assay is intended as an aid in  the diagnosis of influenza from Nasopharyngeal swab specimens and  should not be used as a sole basis for treatment. Nasal washings and  aspirates are unacceptable for Xpert Xpress SARS-CoV-2/FLU/RSV  testing.  Fact Sheet for Patients: PinkCheek.be  Fact Sheet for Healthcare Providers: GravelBags.it  This test is not yet approved or cleared by the [place] FDA and  has been authorized for detection and/or diagnosis of SARS-CoV-2 by  FDA under an Emergency Use Authorization (EUA). This EUA will remain  in effect (meaning this test can be used) for the duration of the  Covid-19 declaration under Section 564(b)(1) of the Act, 21  U.S.C. section 360bbb-3(b)(1), unless the authorization is  terminated or revoked. Performed at [facility], [address], [place], [place] [address]   Urinalysis, Routine w reflex microscopic Urine, Catheterized     Status: Abnormal   Collection Time: 06/15/20  1:19 AM  Result Value Ref Range   Color, Urine AMBER (A) YELLOW    Comment: BIOCHEMICALS MAY BE AFFECTED BY COLOR   APPearance CLEAR CLEAR   Specific Gravity, Urine 1.014 1.005 - 1.030   pH 6.0 5.0 - 8.0   Glucose, UA NEGATIVE NEGATIVE mg/dL   Hgb urine dipstick NEGATIVE NEGATIVE   Bilirubin Urine SMALL (A) NEGATIVE   Ketones, ur NEGATIVE NEGATIVE mg/dL   Protein, ur 30 (A) NEGATIVE mg/dL   Nitrite NEGATIVE NEGATIVE   Leukocytes,Ua NEGATIVE NEGATIVE   RBC / HPF 0-5 0 - 5 RBC/hpf   WBC, UA 0-5 0 - 5 WBC/hpf   Bacteria, UA NONE SEEN NONE SEEN   Squamous Epithelial / LPF 0-5 0 - 5    Comment: Performed at [facility], [address], [place], [place] [address]  Lactic acid, plasma     Status: Abnormal   Collection Time: 06/15/20  1:45 AM  Result Value Ref Range   Lactic Acid, Venous 2.6 (HH) 0.5 - 1.9 mmol/L    Comment: CRITICAL RESULT CALLED TO, READ BACK BY AND VERIFIED WITH: TURNER,C @ 0339 ON 06/15/20 BY JUW Performed at [facility], [address], [place], [place] [id number]   Type and screen     Status: None   Collection Time: 06/15/20  5:40 AM  Result Value Ref Range   ABO/RH(D) O POS    Antibody Screen NEG    Sample Expiration      06/18/2020,2359 Performed at [facility], [address], [place], [place] [address]   CBG monitoring, ED     Status: Abnormal   Collection Time: 06/15/20  7:41 AM  Result Value Ref Range   Glucose-Capillary 190 (H) 70 - 99 mg/dL    Comment: Glucose reference range applies only to samples taken after fasting for at least 8 hours.    CT ABDOMEN PELVIS W CONTRAST  Result Date: 06/15/2020 CLINICAL DATA:  Acute abdominal pain, nonlocalized.  Cholelithiasis. EXAM: CT ABDOMEN AND PELVIS WITH CONTRAST TECHNIQUE: Multidetector CT imaging of the abdomen and pelvis was performed using the standard protocol following bolus administration of intravenous contrast. CONTRAST:  116mL OMNIPAQUE IOHEXOL 300 MG/ML  SOLN COMPARISON:  04/23/2019 FINDINGS:  Lower chest: The visualized lung bases demonstrates interlobular septal thickening and ground-glass pulmonary infiltrate likely representing pulmonary edema, possibly cardiogenic in nature. This appears improved since prior CT examination of 04/18/2020. Coronary artery stenting has been performed. Cardiac size is mildly enlarged. No pericardial effusion. Hepatobiliary: Cholelithiasis. No pericholecystic inflammatory change identified. Liver unremarkable. No intra or extrahepatic biliary ductal dilation. Pancreas: Unremarkable Spleen: Unremarkable Adrenals/Urinary Tract: Multiple bilateral adrenal nodules are unchanged measuring up to 2.5 cm within the left adrenal gland and compatible with multiple adenoma, better evaluated on prior examination. Kidneys are unremarkable. Bladder is unremarkable. Stomach/Bowel: Stomach, small bowel, and large bowel are unremarkable. Appendix normal. No free intraperitoneal gas or fluid. Vascular/Lymphatic: Extensive aortoiliac atherosclerotic calcification is present. No aortic aneurysm. No pathologic adenopathy within the abdomen and pelvis. Reproductive: Uterus and bilateral adnexa are unremarkable. Other: Rectum unremarkable Musculoskeletal: No acute bone abnormality. Remote compression fracture of T9 is unchanged. Superior endplate fracture of L3 is unchanged. IMPRESSION: Bibasilar pulmonary infiltrates and septal thickening in keeping with mild to moderate pulmonary edema, possibly cardiogenic in nature. This appears improved. Cholelithiasis without CT evidence of acute cholecystitis. Multiple stable adrenal adenoma. Biochemical correlation may be helpful to assess for hyperfunctioning. Aortic Atherosclerosis (ICD10-I70.0). Electronically Signed   By: Fidela Salisbury MD  On: 06/15/2020 04:03   DG Chest Port 1 View  Result Date: 06/15/2020 CLINICAL DATA:  Shortness of breath EXAM: PORTABLE CHEST 1 VIEW COMPARISON:  04/17/2020 FINDINGS: The tracheostomy tube terminates above  the carina. The patient is status post prior median sternotomy. The heart size is enlarged. Coarse hazy bilateral airspace opacities are noted. These have improved since the prior study. There is architectural distortion and areas of scarring, similar to prior study. There is blunting of the costophrenic angles bilaterally similar to prior study. There is no acute osseous abnormality. IMPRESSION: Cardiomegaly with chronic bilateral airspace opacities. No definite acute cardiopulmonary process. Electronically Signed   By: Constance Holster M.D.   On: 06/15/2020 01:02    ROS:  Pertinent items are noted in HPI.  Blood pressure 106/65, pulse 88, temperature (!) 100.7 F (38.2 C), temperature source Oral, resp. rate 18, height 4\' 10"  (1.473 m), weight 99.8 kg, SpO2 92 %. Physical Exam: Mildly uncomfortable white female Head is normocephalic, atraumatic Neck with permanent tracheostomy.  Multiple secretions present. Lungs with coarse rhonchi throughout Heart examination reveals a regular rate and rhythm without S3, S4, murmurs Abdomen is soft with some tenderness in the right upper quadrant to palpation.  Examination is limited secondary to body habitus.  No rigidity is noted.  CT scan images personally reviewed Assessment/Plan: Impression: Cholelithiasis in the face of transaminitis and elevated bilirubin.  She also has an elevated leukocytosis.  Ultrasound is pending.  There is evidence that she may have early cholangitis. Plan: Agree with IV antibiotic coverage.  Suggest GI consultation.  Made the MRCP to further evaluate the common bile duct.  Patient is a poor surgical candidate and should she worsen, cholecystostomy tube placed by IR may be indicated.  This was discussed with Dr. Alda Lea.  Will follow with you.  Aviva Signs 06/15/2020, 11:24 AM

## 2020-06-15 NOTE — ED Notes (Signed)
Patient transported to CT 

## 2020-06-15 NOTE — ED Notes (Signed)
Call from lab  Pt blood cultures x 2 positive for   Gram negative

## 2020-06-15 NOTE — ED Provider Notes (Signed)
[facility] EMERGENCY DEPARTMENT Provider Note   CSN: [id number] Arrival date & time: 06/14/20  2302     History Chief Complaint  Patient presents with  . Abdominal Pain    Theresa [name] is a 54 y.o. female.  The history is provided by the patient.  Abdominal Pain Pain location:  RUQ Pain quality: sharp   Pain radiates to:  Back Pain severity:  Severe Onset quality:  Gradual Duration: Several hours. Timing:  Intermittent Progression:  Worsening Chronicity:  New Context: eating   Relieved by:  Nothing Worsened by:  Movement, palpation and eating Associated symptoms: chills, fatigue, nausea, shortness of breath and vomiting   Associated symptoms: no chest pain, no constipation, no diarrhea, no dysuria, no fever, no hematemesis and no hematochezia       Patient with extensive history including CAD, CHF, COPD on home oxygen, previous trach placement, history of DVT on Coumadin, history of lupus anticoagulant disorder, morbid obesity, presents with abdominal pain.  She reports eating a corn dog earlier in the evening and then had onset of right upper quadrant abdominal pain that radiates to her back.  This feels similar to prior episodes of her gallbladder.  She has been told that her heart is "too weak "to have any sort of surgery.  She has had this type of pain before she reports associated nonbloody emesis. No change in bowel movements. Denies any changes in her trach output, no bloody discharge Past Medical History:  Diagnosis Date  . Acute kidney failure with lesion of tubular necrosis (HCC)   . Acute systolic heart failure ([place])   . CAD (coronary artery disease)    a. s/p prior PCI. b. CABG 2007 at [facility] in [place] 2007. c. inferior STEMI 10/2015 s/p DES to dSVG-PDA.  . Cardiac arrest ([place])   . Cervical cancer ([place])   . Chronic diastolic CHF (congestive heart failure) ([place])   . Chronic respiratory failure ([place])    s/p tracheostomy 2002  . Chronic RUQ  pain   . COPD (chronic obstructive pulmonary disease) ([place])   . Diabetes mellitus ([place])   . DVT (deep venous thrombosis) ([place])   . Endometriosis   . History of gallstones 01/2016   seen on Ultrasound  . History of HIDA scan 11/2016   normal  . HTN (hypertension)   . Hyperlipidemia   . Lupus anticoagulant disorder (HCC)    on coumadin  . Morbid obesity ([place])   . ST elevation (STEMI) myocardial infarction involving right coronary artery ([place]) 10/29/15   stent to VG to PDA  . Toe fracture, right 03/29/2018  . Tracheostomy in place [facility]), chronic since 2002 11/03/2015  . Tracheostomy status [facility])     Patient Active Problem List   Diagnosis Date Noted  . CHF (congestive heart failure) ([place]) 04/18/2020  . Acute respiratory failure ([place]) 04/17/2020  . Chronic venous insufficiency 06/19/2019  . Cholelithiasis 05/07/2019  . Adult body mass index 50.0-59.9 ([place]) 03/22/2019  . Anxiety 03/22/2019  . Low back pain 03/22/2019  . Cigarette nicotine dependence without complication 94/85/4627  . History of vitamin D deficiency 03/22/2019  . Insomnia 03/22/2019  . Knee pain 03/22/2019  . Localized, primary osteoarthritis of lower leg, unspecified laterality 03/22/2019  . Neck pain 03/22/2019  . Psoriasis 03/22/2019  . Thrombophlebitis of deep veins of lower extremity ([place]) 03/22/2019  . Deep vein thrombosis ([place]) 03/22/2019  . Fracture of humeral shaft, left, closed 03/22/2019  . Chronic respiratory insufficiency 01/19/2019  .  CAD (coronary artery disease) 01/15/2019  . Educated about COVID-19 virus infection 01/12/2019  . Acute on chronic combined systolic and diastolic HF (heart failure) ([place]) 01/12/2019  . Acute on chronic respiratory failure with hypoxia ([place]) 10/24/2018  . Pulmonary hypertension, unspecified ([place])   . Acute on chronic respiratory failure with hypoxia ([place]) 08/01/2017  . Uncontrolled type 2 diabetes mellitus with hyperglycemia, with long-term current use of insulin  ([place]) 08/01/2017  . Dyspnea 07/31/2017  . Acute on chronic congestive heart failure ([place])   . Acute respiratory failure with hypoxia ([place]) 01/22/2016  . Type 2 diabetes mellitus with hyperglycemia ([place]) 01/22/2016  . CAD (coronary artery disease) of artery bypass graft: PTCA/DES to distal body VG to PDA 10/29/15 11/03/2015  . Tracheostomy in place [facility]), chronic since 2002 11/03/2015  . Hypokalemia 11/03/2015  . Lupus anticoagulant syndrome ([place])   . Chronic diastolic CHF (congestive heart failure) ([place])   . Respiratory failure ([place])   . Coronary artery disease involving coronary bypass graft of native heart without angina pectoris   . OSA (obstructive sleep apnea)   . COPD exacerbation ([place])   . Tobacco abuse   . DM (diabetes mellitus) ([place]) 06/11/2009  . Hyperlipidemia LDL goal <70 06/11/2009  . Acute thromboembolism of deep veins of lower extremity ([place]) 06/11/2009  . History of CABG 06/11/2009  . Essential hypertension 05/27/2009  . CAD, NATIVE VESSEL 05/27/2009    Past Surgical History:  Procedure Laterality Date  . CARDIAC CATHETERIZATION N/A 10/29/2015   Procedure: Left Heart Cath and Cors/Grafts Angiography;  Surgeon: Burnell Blanks, MD;  Location: [place] CV LAB;  Service: Cardiovascular;  Laterality: N/A;  . CARDIAC CATHETERIZATION  10/29/2015   Procedure: Coronary Stent Intervention;  Surgeon: Burnell Blanks, MD;  Location: [place] CV LAB;  Service: Cardiovascular;;  . CAROTID STENT    . CESAREAN SECTION WITH BILATERAL TUBAL LIGATION    . CORONARY ARTERY BYPASS GRAFT  2007   2V  . IR GASTROSTOMY TUBE MOD SED  11/13/2018  . RIGHT/LEFT HEART CATH AND CORONARY/GRAFT ANGIOGRAPHY N/A 03/24/2018   Procedure: RIGHT/LEFT HEART CATH AND CORONARY/GRAFT ANGIOGRAPHY;  Surgeon: Belva Crome, MD;  Location: [place] [facility];  Service: Cardiovascular;  Laterality: N/A;  . TRACHEOSTOMY       OB History   No obstetric history on file.     Family History    Problem Relation Age of Onset  . Hypertension Mother   . Diabetes Mother   . Hypertension Father   . Diabetes Father     Social History   Tobacco Use  . Smoking status: Current Every Day Smoker    Packs/day: 0.75    Types: Cigarettes    Start date: 10/23/1978  . Smokeless tobacco: Never Used  . Tobacco comment: 1/2 pack - trying to cut back   Vaping Use  . Vaping Use: Never used  Substance Use Topics  . Alcohol use: No    Alcohol/week: 0.0 standard drinks  . Drug use: No    Home Medications Prior to Admission medications   Medication Sig Start Date End Date Taking? Authorizing Provider  albuterol (PROVENTIL) (2.5 MG/3ML) 0.083% nebulizer solution Take 3 mLs (2.5 mg total) by nebulization 2 (two) times daily. Patient taking differently: Take 2.5 mg by nebulization every 4 (four) hours as needed for wheezing.  10/19/18   Erick Colace, NP  albuterol (VENTOLIN HFA) 108 (90 Base) MCG/ACT inhaler Inhale 2 puffs into the lungs every 4 (four) hours as  needed for wheezing or shortness of breath.    [provider]  atorvastatin (LIPITOR) 80 MG tablet Take 1 tablet (80 mg total) by mouth daily. 05/09/20   Sande Rives E, PA-C  gabapentin (NEURONTIN) 300 MG capsule Take 1 capsule (300mg ) in the morning, 1 capsule (300mg ) in the afternoon, and 2 capsules (600mg ) at bedtime Patient taking differently: Take 900 mg by mouth at bedtime.  01/19/19   Sande Rives E, PA-C  insulin degludec (TRESIBA FLEXTOUCH) 100 UNIT/ML FlexTouch Pen Inject 50 Units into the skin at bedtime.     [provider]  LORazepam (ATIVAN) 0.5 MG tablet Take 0.5 mg by mouth every 6 (six) hours as needed for anxiety.     [provider]  losartan (COZAAR) 25 MG tablet Take 1 tablet (25 mg total) by mouth daily. 01/20/19   Sande Rives E, PA-C  metFORMIN (GLUCOPHAGE) 500 MG tablet Take 500 mg by mouth every morning. 11/09/19   [provider]  metoprolol succinate (TOPROL-XL) 50  MG 24 hr tablet Take 1 tablet (50 mg total) by mouth daily. 07/27/19   [name], MD  nitroGLYCERIN (NITROSTAT) 0.4 MG SL tablet Place 0.4 mg under the tongue every 5 (five) minutes as needed for chest pain.    [provider]  NOVOLOG FLEXPEN 100 UNIT/ML FlexPen Inject 1-10 Units into the skin 3 (three) times daily before meals. 04/06/20   [provider]  OZEMPIC, 0.25 OR 0.5 MG/DOSE, 2 MG/1.5ML SOPN Inject 0.5 mg into the skin every Wednesday.  04/08/19   [provider]  pantoprazole (PROTONIX) 20 MG tablet Take 20 mg by mouth daily. 02/16/20   [provider]  potassium chloride SA (KLOR-CON) 20 MEQ tablet Take 20 mEq by mouth daily. 04/17/20   [provider]  predniSONE (STERAPRED UNI-PAK 21 TAB) 5 MG (21) TBPK tablet Take by mouth. 04/29/20   [provider]  sertraline (ZOLOFT) 50 MG tablet Take 50 mg by mouth at bedtime.     [provider]  tamsulosin (FLOMAX) 0.4 MG CAPS capsule Take 0.4 mg by mouth daily.    [provider]  torsemide (DEMADEX) 20 MG tablet Take 2 tablets (40 mg total) by mouth 2 (two) times daily. 04/25/20   Charlynne Cousins, MD  traMADol (ULTRAM) 50 MG tablet Take 50 mg by mouth 3 (three) times daily as needed. 03/29/20   [provider]  warfarin (COUMADIN) 2.5 MG tablet Take 2 tablets (5 mg) tonight at 6 PM.  Go to your PCP for INR tomorrow. Patient taking differently: Take 2.5-5 mg by mouth every morning. Take 2 tablets (5 mg) tonight at 6 PM.  Go to your PCP for INR tomorrow. Mon Wed Fri and Sat 5 mg; all other days is 2.5 mg 05/17/19   Mercy Riding, MD    Allergies    Penicillins, Ciprofloxacin, and Ibuprofen  Review of Systems   Review of Systems  Constitutional: Positive for chills and fatigue. Negative for fever.  Respiratory: Positive for shortness of breath.   Cardiovascular: Negative for chest pain.  Gastrointestinal: Positive for abdominal pain, nausea and vomiting.  Negative for constipation, diarrhea, hematemesis and hematochezia.  Genitourinary: Negative for dysuria.  All other systems reviewed and are negative.   Physical Exam Updated Vital Signs BP 120/89 (BP Location: Left Arm)   Pulse 90   Temp (!) 100.7 F (38.2 C) (Oral)   Resp 20   Ht 1.473 m (4\' 10" )   Wt 99.8  kg   SpO2 98%   BMI 45.98 kg/m   Physical Exam CONSTITUTIONAL: Chronically ill-appearing HEAD: Normocephalic/atraumatic EYES: EOMI/PERRL ENMT: Mucous membranes moist, trach in place no bloody discharge, no stridor NECK: supple no meningeal signs SPINE/BACK:entire spine nontender CV: S1/S2 noted, no murmurs/rubs/gallops noted LUNGS: Mild tachypnea, crackles bilaterally ABDOMEN: soft, moderate RUQ tenderness, no rebound or guarding, bowel sounds noted throughout abdomen GU:no cva tenderness NEURO: Pt is awake/alert/appropriate, moves all extremitiesx4.  No facial droop.   EXTREMITIES: pulses normal/equal, full ROM SKIN: warm, color normal PSYCH: no abnormalities of mood noted, alert and oriented to situation  ED Results / Procedures / Treatments   Labs (all labs ordered are listed, but only abnormal results are displayed) Labs Reviewed  CBC WITH DIFFERENTIAL/PLATELET - Abnormal; Notable for the following components:      Result Value   WBC 14.8 (*)    Neutro Abs 13.6 (*)    Lymphs Abs 0.4 (*)    Abs Immature Granulocytes 0.08 (*)    All other components within normal limits  COMPREHENSIVE METABOLIC PANEL - Abnormal; Notable for the following components:   Potassium 2.8 (*)    Chloride 88 (*)    CO2 36 (*)    Glucose, Bld 232 (*)    Calcium 8.5 (*)    Albumin 3.0 (*)    AST 485 (*)    ALT 163 (*)    Alkaline Phosphatase 232 (*)    Total Bilirubin 3.0 (*)    All other components within normal limits  PROTIME-INR - Abnormal; Notable for the following components:   Prothrombin Time 22.1 (*)    INR 2.0 (*)    All other components within normal limits  LACTIC  ACID, PLASMA - Abnormal; Notable for the following components:   Lactic Acid, Venous 2.8 (*)    All other components within normal limits  LACTIC ACID, PLASMA - Abnormal; Notable for the following components:   Lactic Acid, Venous 2.6 (*)    All other components within normal limits  URINALYSIS, ROUTINE W REFLEX MICROSCOPIC - Abnormal; Notable for the following components:   Color, Urine AMBER (*)    Bilirubin Urine SMALL (*)    Protein, ur 30 (*)    All other components within normal limits  APTT - Abnormal; Notable for the following components:   aPTT 44 (*)    All other components within normal limits  RESPIRATORY PANEL BY RT PCR (FLU A&B, COVID)  URINE CULTURE  CULTURE, BLOOD (ROUTINE X 2)  CULTURE, BLOOD (ROUTINE X 2)  LIPASE, BLOOD  TYPE AND SCREEN    EKG EKG Interpretation  Date/Time:  Saturday June 14 2020 23:47:41 EDT Ventricular Rate:  95 PR Interval:    QRS Duration: 91 QT Interval:  374 QTC Calculation: 471 R Axis:   -4 Text Interpretation: Sinus rhythm Low voltage, precordial leads Consider anterior infarct No significant change since last tracing Confirmed by Ripley Fraise [phone]) on 06/15/2020 12:17:00 AM   Radiology CT ABDOMEN PELVIS W CONTRAST  Result Date: 06/15/2020 CLINICAL DATA:  Acute abdominal pain, nonlocalized.  Cholelithiasis. EXAM: CT ABDOMEN AND PELVIS WITH CONTRAST TECHNIQUE: Multidetector CT imaging of the abdomen and pelvis was performed using the standard protocol following bolus administration of intravenous contrast. CONTRAST:  169mL OMNIPAQUE IOHEXOL 300 MG/ML  SOLN COMPARISON:  04/23/2019 FINDINGS: Lower chest: The visualized lung bases demonstrates interlobular septal thickening and ground-glass pulmonary infiltrate likely representing pulmonary edema, possibly cardiogenic in nature. This appears improved since prior CT examination of 04/18/2020.  Coronary artery stenting has been performed. Cardiac size is mildly enlarged. No  pericardial effusion. Hepatobiliary: Cholelithiasis. No pericholecystic inflammatory change identified. Liver unremarkable. No intra or extrahepatic biliary ductal dilation. Pancreas: Unremarkable Spleen: Unremarkable Adrenals/Urinary Tract: Multiple bilateral adrenal nodules are unchanged measuring up to 2.5 cm within the left adrenal gland and compatible with multiple adenoma, better evaluated on prior examination. Kidneys are unremarkable. Bladder is unremarkable. Stomach/Bowel: Stomach, small bowel, and large bowel are unremarkable. Appendix normal. No free intraperitoneal gas or fluid. Vascular/Lymphatic: Extensive aortoiliac atherosclerotic calcification is present. No aortic aneurysm. No pathologic adenopathy within the abdomen and pelvis. Reproductive: Uterus and bilateral adnexa are unremarkable. Other: Rectum unremarkable Musculoskeletal: No acute bone abnormality. Remote compression fracture of T9 is unchanged. Superior endplate fracture of L3 is unchanged. IMPRESSION: Bibasilar pulmonary infiltrates and septal thickening in keeping with mild to moderate pulmonary edema, possibly cardiogenic in nature. This appears improved. Cholelithiasis without CT evidence of acute cholecystitis. Multiple stable adrenal adenoma. Biochemical correlation may be helpful to assess for hyperfunctioning. Aortic Atherosclerosis (ICD10-I70.0). Electronically Signed   By: Fidela Salisbury MD   On: 06/15/2020 04:03   DG Chest Port 1 View  Result Date: 06/15/2020 CLINICAL DATA:  Shortness of breath EXAM: PORTABLE CHEST 1 VIEW COMPARISON:  04/17/2020 FINDINGS: The tracheostomy tube terminates above the carina. The patient is status post prior median sternotomy. The heart size is enlarged. Coarse hazy bilateral airspace opacities are noted. These have improved since the prior study. There is architectural distortion and areas of scarring, similar to prior study. There is blunting of the costophrenic angles bilaterally similar to  prior study. There is no acute osseous abnormality. IMPRESSION: Cardiomegaly with chronic bilateral airspace opacities. No definite acute cardiopulmonary process. Electronically Signed   By: Constance Holster M.D.   On: 06/15/2020 01:02    Procedures .Critical Care Performed by: Ripley Fraise, MD Authorized by: Ripley Fraise, MD   Critical care provider statement:    Critical care time (minutes):  45   Critical care start time:  06/15/2020 12:45 AM   Critical care end time:  06/15/2020 1:30 AM   Critical care time was exclusive of:  Separately billable procedures and treating other patients   Critical care was necessary to treat or prevent imminent or life-threatening deterioration of the following conditions:  Respiratory failure and sepsis   Critical care was time spent personally by me on the following activities:  Pulse oximetry, ordering and review of radiographic studies, ordering and review of laboratory studies, ordering and performing treatments and interventions, re-evaluation of patient's condition, review of old charts, examination of patient, evaluation of patient's response to treatment, development of treatment plan with patient or surrogate and obtaining history from patient or surrogate   I assumed direction of critical care for this patient from another provider in my specialty: no      Medications Ordered in ED Medications  lactated ringers infusion ( Intravenous New Bag/Given 06/15/20 0206)  potassium chloride 10 mEq in 100 mL IVPB (10 mEq Intravenous New Bag/Given 06/15/20 0446)  fentaNYL (SUBLIMAZE) injection 50 mcg (50 mcg Intravenous Given 06/15/20 0034)  ondansetron (ZOFRAN) injection 4 mg (4 mg Intravenous Given 06/15/20 0034)  lactated ringers bolus 1,000 mL (0 mLs Intravenous Stopped 06/15/20 0235)    And  lactated ringers bolus 1,000 mL (0 mLs Intravenous Stopped 06/15/20 0258)  aztreonam (AZACTAM) 2 g in sodium chloride 0.9 % 100 mL IVPB (0 g  Intravenous Stopped 06/15/20 0258)  metroNIDAZOLE (FLAGYL) IVPB 500 mg (0 mg  Intravenous Stopped 06/15/20 0358)  iohexol (OMNIPAQUE) 300 MG/ML solution 100 mL (100 mLs Intravenous Contrast Given 06/15/20 0231)    ED Course  I have reviewed the triage vital signs and the nursing notes.  Pertinent labs & imaging results that were available during my care of the patient were reviewed by me and considered in my medical decision making (see chart for details).    MDM Rules/Calculators/A&P                         1:42 AM Code sepsis has been called.  IV fluids and antibiotics have been ordered. Strong suspicion the patient has acute cholecystitis/cholangitis.  Patient will require CT imaging and admission to the hospital. 4:31 AM Patient overall is improving.  Heart rate and blood pressure have stabilized. CT reveals cholelithiasis without convincing signs of cholecystitis.  However due to fever, pain and liver dysfunction, still strongly considering biliary cause. She has been given antibiotics.  She will be admitted to the hospital and surgical consult later in the day.  Patient will likely need ultrasound once it is available   This patient presents to the ED for concern of abdominal pain, this involves an extensive number of treatment options, and is a complaint that carries with it a high risk of complications and morbidity.  The differential diagnosis includes cholelithiasis, appendicitis, perforated bowel, pancreatitis   Lab Tests:   I Ordered, reviewed, and interpreted labs, which included electrolytes, liver function panel, lipase, urinalysis  Medicines ordered:   I ordered medication to fentanyl for pain  Imaging Studies ordered:   I ordered imaging studies which included chest x-ray and CT abdomen pelvis  I independently visualized and interpreted imaging which showed mild pulmonary edema and cholelithiasis  Additional history obtained:    Previous records obtained  and reviewed   Consultations Obtained:   I consulted [facility] hospitalist and discussed lab and imaging findings Discussed with Dr. Josephine Cables for admission Reevaluation:  After the interventions stated above, I reevaluated the patient and found patient is improved.  Heart rate and blood pressure have stabilized.  Pain is well controlled  Critical Interventions:  . IV fluids and antibiotics  Final Clinical Impression(s) / ED Diagnoses Final diagnoses:  Calculus of gallbladder without cholecystitis without obstruction  Acute febrile illness  Hypokalemia    Rx / DC Orders ED Discharge Orders    None       Ripley Fraise, MD 06/15/20 [phone]

## 2020-06-15 NOTE — H&P (Signed)
History and Physical  Theresa [name] MBW:[id number] DOB: July 24, 1966 DOA: 06/14/2020  Referring physician: Ripley Fraise, MD PCP: Monico Blitz, MD  Patient coming from: Home  Chief Complaint: Abdominal pain  HPI: Theresa [name] is a 54 y.o. female with medical history significant for CAD status post CABG status post stenting, chronic trach collar, chronic systolic heart failure last EF measured in August 2021 was 11 to 45%, diabetes mellitus type 2, DVT on Coumadin and morbid obesity who presents to the emergency department due to abdominal pain which started yesterday in the evening after eating a corn dog and onion rings.  Pain was in the right upper quadrant with radiation to the back, it was rated as 8/10 on pain scale, aggravated with movement.  She endorsed 4 episodes of nonbloody vomiting.  Patient states that she had similar pain about a year ago.  She denies chest pain, headache, changes in trach output.  ED Course:  In the emergency department, she was febrile with a temperature of 100.8F BP was 120/89.  Work-up in the ED showed leukocytosis, hypokalemia, hyperglycemia.  Lactic acid 2.8> 2.6, AST 45, ALT 163, ALP 232.  CT abdomen and pelvis showed cholelithiasis without CT evidence of acute cholecystitis.  Improved bibasilar pulmonary infiltrates and septal thickening in keeping with mild to moderate pulmonary edema, possibly cardiogenic in nature was noted.  Chest x-ray showed cardiomegaly with chronic bilateral airspace opacities with no definite acute cardiopulmonary process.  IV hydration per sepsis protocol was provided. IV aztreonam and metronidazole were given due to presumed cholecystitis.  IV fentanyl 38mcg was given due to pain.  Hospitalist was asked to admit patient for further evaluation management.  Review of Systems: Constitutional: Positive for fatigue and chills. Negative for fever.  HENT: Negative for ear pain and sore throat.   Eyes: Negative for pain and visual  disturbance.  Respiratory: Negative for cough, chest tightness and shortness of breath.   Cardiovascular: Negative for chest pain and palpitations.  Gastrointestinal: Positive for abdominal pain, nausea and vomiting.  Endocrine: Negative for polyphagia and polyuria.  Genitourinary: Negative for decreased urine volume, dysuria Musculoskeletal: Negative for arthralgias and back pain.  Skin: Negative for color change and rash.  Allergic/Immunologic: Negative for immunocompromised state.  Neurological: Negative for tremors, syncope, speech difficulty, weakness, light-headedness and headaches.  Hematological: Does not bruise/bleed easily.  All other systems reviewed and are negative    Past Medical History:  Diagnosis Date  . Acute kidney failure with lesion of tubular necrosis (HCC)   . Acute systolic heart failure ([place])   . CAD (coronary artery disease)    a. s/p prior PCI. b. CABG 2007 at [facility] in [place] 2007. c. inferior STEMI 10/2015 s/p DES to dSVG-PDA.  . Cardiac arrest ([place])   . Cervical cancer ([place])   . Chronic diastolic CHF (congestive heart failure) ([place])   . Chronic respiratory failure ([place])    s/p tracheostomy 2002  . Chronic RUQ pain   . COPD (chronic obstructive pulmonary disease) ([place])   . Diabetes mellitus ([place])   . DVT (deep venous thrombosis) ([place])   . Endometriosis   . History of gallstones 01/2016   seen on Ultrasound  . History of HIDA scan 11/2016   normal  . HTN (hypertension)   . Hyperlipidemia   . Lupus anticoagulant disorder (HCC)    on coumadin  . Morbid obesity ([place])   . ST elevation (STEMI) myocardial infarction involving right coronary artery ([place]) 10/29/15   stent  to VG to PDA  . Toe fracture, right 03/29/2018  . Tracheostomy in place [facility]), chronic since 2002 11/03/2015  . Tracheostomy status [facility])    Past Surgical History:  Procedure Laterality Date  . CARDIAC CATHETERIZATION N/A 10/29/2015   Procedure: Left Heart Cath and  Cors/Grafts Angiography;  Surgeon: Burnell Blanks, MD;  Location: [place] [facility];  Service: Cardiovascular;  Laterality: N/A;  . CARDIAC CATHETERIZATION  10/29/2015   Procedure: Coronary Stent Intervention;  Surgeon: Burnell Blanks, MD;  Location: [place] [facility];  Service: Cardiovascular;;  . CAROTID STENT    . CESAREAN SECTION WITH BILATERAL TUBAL LIGATION    . CORONARY ARTERY BYPASS GRAFT  2007   2V  . IR GASTROSTOMY TUBE MOD SED  11/13/2018  . RIGHT/LEFT HEART CATH AND CORONARY/GRAFT ANGIOGRAPHY N/A 03/24/2018   Procedure: RIGHT/LEFT HEART CATH AND CORONARY/GRAFT ANGIOGRAPHY;  Surgeon: Belva Crome, MD;  Location: [place] [facility];  Service: Cardiovascular;  Laterality: N/A;  . TRACHEOSTOMY      Social History:  reports that she has been smoking cigarettes. She started smoking about 41 years ago. She has been smoking about 0.75 packs per day. She has never used smokeless tobacco. She reports that she does not drink alcohol and does not use drugs.   Allergies  Allergen Reactions  . Penicillins Rash    Has patient had a PCN reaction causing immediate rash, facial/tongue/throat swelling, SOB or lightheadedness with hypotension: Yes Has patient had a PCN reaction causing severe rash involving mucus membranes or skin necrosis: No Has patient had a PCN reaction that required hospitalization No Has patient had a PCN reaction occurring within the last 10 years: No If all of the above answers are "NO", then may proceed with Cephalosporin use.   REACTION: rash  . Ciprofloxacin     nausea  . Ibuprofen Rash    swelling in leg     Family History  Problem Relation Age of Onset  . Hypertension Mother   . Diabetes Mother   . Hypertension Father   . Diabetes Father     Prior to Admission medications   Medication Sig Start Date End Date Taking? Authorizing Provider  albuterol (PROVENTIL) (2.5 MG/3ML) 0.083% nebulizer solution Take 3 mLs (2.5 mg total) by  nebulization 2 (two) times daily. Patient taking differently: Take 2.5 mg by nebulization every 4 (four) hours as needed for wheezing.  10/19/18   Erick Colace, NP  albuterol (VENTOLIN HFA) 108 (90 Base) MCG/ACT inhaler Inhale 2 puffs into the lungs every 4 (four) hours as needed for wheezing or shortness of breath.    [provider]  atorvastatin (LIPITOR) 80 MG tablet Take 1 tablet (80 mg total) by mouth daily. 05/09/20   Sande Rives E, PA-C  gabapentin (NEURONTIN) 300 MG capsule Take 1 capsule (300mg ) in the morning, 1 capsule (300mg ) in the afternoon, and 2 capsules (600mg ) at bedtime Patient taking differently: Take 900 mg by mouth at bedtime.  01/19/19   Sande Rives E, PA-C  insulin degludec (TRESIBA FLEXTOUCH) 100 UNIT/ML FlexTouch Pen Inject 50 Units into the skin at bedtime.     [provider]  LORazepam (ATIVAN) 0.5 MG tablet Take 0.5 mg by mouth every 6 (six) hours as needed for anxiety.     [provider]  losartan (COZAAR) 25 MG tablet Take 1 tablet (25 mg total) by mouth daily. 01/20/19   Sande Rives E, PA-C  metFORMIN (GLUCOPHAGE) 500 MG tablet Take 500  mg by mouth every morning. 11/09/19   [provider]  metoprolol succinate (TOPROL-XL) 50 MG 24 hr tablet Take 1 tablet (50 mg total) by mouth daily. 07/27/19   [name], MD  nitroGLYCERIN (NITROSTAT) 0.4 MG SL tablet Place 0.4 mg under the tongue every 5 (five) minutes as needed for chest pain.    [provider]  NOVOLOG FLEXPEN 100 UNIT/ML FlexPen Inject 1-10 Units into the skin 3 (three) times daily before meals. 04/06/20   [provider]  OZEMPIC, 0.25 OR 0.5 MG/DOSE, 2 MG/1.5ML SOPN Inject 0.5 mg into the skin every Wednesday.  04/08/19   [provider]  pantoprazole (PROTONIX) 20 MG tablet Take 20 mg by mouth daily. 02/16/20   [provider]  potassium chloride SA (KLOR-CON) 20 MEQ tablet Take 20 mEq by mouth daily. 04/17/20   [provider]  predniSONE (STERAPRED UNI-PAK 21 TAB) 5 MG (21) TBPK tablet Take by mouth. 04/29/20   [provider]  sertraline (ZOLOFT) 50 MG tablet Take 50 mg by mouth at bedtime.     [provider]  tamsulosin (FLOMAX) 0.4 MG CAPS capsule Take 0.4 mg by mouth daily.    [provider]  torsemide (DEMADEX) 20 MG tablet Take 2 tablets (40 mg total) by mouth 2 (two) times daily. 04/25/20   Charlynne Cousins, MD  traMADol (ULTRAM) 50 MG tablet Take 50 mg by mouth 3 (three) times daily as needed. 03/29/20   [provider]  warfarin (COUMADIN) 2.5 MG tablet Take 2 tablets (5 mg) tonight at 6 PM.  Go to your PCP for INR tomorrow. Patient taking differently: Take 2.5-5 mg by mouth every morning. Take 2 tablets (5 mg) tonight at 6 PM.  Go to your PCP for INR tomorrow. Mon Wed Fri and Sat 5 mg; all other days is 2.5 mg 05/17/19   Mercy Riding, MD    Physical Exam: BP 106/65   Pulse 88   Temp (!) 100.7 F (38.2 C) (Oral)   Resp 18   Ht 4\' 10"  (1.473 m)   Wt 99.8 kg   SpO2 94%   BMI 45.98 kg/m   . General: 54 y.o. year-old female well developed well nourished in no acute distress.  Alert and oriented x3. Marland Kitchen HEENT: NCAT, EOMI . Neck: Supple trach site noted . Cardiovascular: Regular rate and rhythm with no rubs or gallops.  No thyromegaly or JVD noted.  No lower extremity edema. 2/4 pulses in all 4 extremities. Marland Kitchen Respiratory: Clear to auscultation with no wheezes or rales. Good inspiratory effort. . Abdomen: Soft, RUQ tender to palpation with no guarding. Normal bowel sounds x4 quadrants. . Muskuloskeletal: No cyanosis, clubbing or edema noted bilaterally . Neuro: CN II-XII intact, strength, sensation, reflexes . Skin: No ulcerative lesions noted or rashes . Psychiatry: Judgement and insight appear normal. Mood is appropriate for condition and setting          Labs on Admission:  Basic Metabolic Panel: Recent Labs  Lab 06/15/20 0014  NA 138  K  2.8*  CL 88*  CO2 36*  GLUCOSE 232*  BUN 13  CREATININE 0.90  CALCIUM 8.5*   Liver Function Tests: Recent Labs  Lab 06/15/20 0014  AST 485*  ALT 163*  ALKPHOS 232*  BILITOT 3.0*  PROT 6.9  ALBUMIN 3.0*   Recent Labs  Lab 06/15/20 0014  LIPASE 25   No results for input(s): AMMONIA in the last 168 hours. CBC: Recent Labs  Lab 06/15/20 0014  WBC 14.8*  NEUTROABS 13.6*  HGB 12.2  HCT 38.0  MCV 93.1  PLT 247   Cardiac Enzymes: No results for input(s): CKTOTAL, CKMB, CKMBINDEX, TROPONINI in the last 168 hours.  BNP (last 3 results) Recent Labs    04/17/20 1923  BNP 550.3*    ProBNP (last 3 results) No results for input(s): PROBNP in the last 8760 hours.  CBG: No results for input(s): GLUCAP in the last 168 hours.  Radiological Exams on Admission: CT ABDOMEN PELVIS W CONTRAST  Result Date: 06/15/2020 CLINICAL DATA:  Acute abdominal pain, nonlocalized.  Cholelithiasis. EXAM: CT ABDOMEN AND PELVIS WITH CONTRAST TECHNIQUE: Multidetector CT imaging of the abdomen and pelvis was performed using the standard protocol following bolus administration of intravenous contrast. CONTRAST:  176mL OMNIPAQUE IOHEXOL 300 MG/ML  SOLN COMPARISON:  04/23/2019 FINDINGS: Lower chest: The visualized lung bases demonstrates interlobular septal thickening and ground-glass pulmonary infiltrate likely representing pulmonary edema, possibly cardiogenic in nature. This appears improved since prior CT examination of 04/18/2020. Coronary artery stenting has been performed. Cardiac size is mildly enlarged. No pericardial effusion. Hepatobiliary: Cholelithiasis. No pericholecystic inflammatory change identified. Liver unremarkable. No intra or extrahepatic biliary ductal dilation. Pancreas: Unremarkable Spleen: Unremarkable Adrenals/Urinary Tract: Multiple bilateral adrenal nodules are unchanged measuring up to 2.5 cm within the left adrenal gland and compatible with multiple adenoma, better  evaluated on prior examination. Kidneys are unremarkable. Bladder is unremarkable. Stomach/Bowel: Stomach, small bowel, and large bowel are unremarkable. Appendix normal. No free intraperitoneal gas or fluid. Vascular/Lymphatic: Extensive aortoiliac atherosclerotic calcification is present. No aortic aneurysm. No pathologic adenopathy within the abdomen and pelvis. Reproductive: Uterus and bilateral adnexa are unremarkable. Other: Rectum unremarkable Musculoskeletal: No acute bone abnormality. Remote compression fracture of T9 is unchanged. Superior endplate fracture of L3 is unchanged. IMPRESSION: Bibasilar pulmonary infiltrates and septal thickening in keeping with mild to moderate pulmonary edema, possibly cardiogenic in nature. This appears improved. Cholelithiasis without CT evidence of acute cholecystitis. Multiple stable adrenal adenoma. Biochemical correlation may be helpful to assess for hyperfunctioning. Aortic Atherosclerosis (ICD10-I70.0). Electronically Signed   By: Fidela Salisbury MD   On: 06/15/2020 04:03   DG Chest Port 1 View  Result Date: 06/15/2020 CLINICAL DATA:  Shortness of breath EXAM: PORTABLE CHEST 1 VIEW COMPARISON:  04/17/2020 FINDINGS: The tracheostomy tube terminates above the carina. The patient is status post prior median sternotomy. The heart size is enlarged. Coarse hazy bilateral airspace opacities are noted. These have improved since the prior study. There is architectural distortion and areas of scarring, similar to prior study. There is blunting of the costophrenic angles bilaterally similar to prior study. There is no acute osseous abnormality. IMPRESSION: Cardiomegaly with chronic bilateral airspace opacities. No definite acute cardiopulmonary process. Electronically Signed   By: Constance Holster M.D.   On: 06/15/2020 01:02    EKG: I independently viewed the EKG done and my findings are as followed: Sinus rhythm at a rate of 95 bpm  Assessment/Plan Present on  Admission: . Severe sepsis ([place]) . Hyperlipidemia LDL goal <70 . Chronic respiratory insufficiency . Essential hypertension . Hypokalemia . CAD (coronary artery disease) . Cholelithiasis  Principal Problem:   Severe sepsis (HCC) Active Problems:   DM (diabetes mellitus) ([place])   Hyperlipidemia LDL goal <70   Essential hypertension   Tracheostomy in place [facility]), chronic since 2002   Hypokalemia   Hyperglycemia due to diabetes mellitus (HCC)   Nausea & vomiting   CAD (coronary artery disease)   Chronic  respiratory insufficiency   DVT (deep venous thrombosis) (HCC)   Cholelithiasis   Biliary colic   Abdominal pain   Transaminitis   Severe sepsis in the setting of biliary colic secondary to cholelithiasis r/o acute cholecystitis Abdominal pain, nausea and vomiting in the setting of above Patient was febrile and presents with leukocytosis (meets SIRS criteria), she complains of abdominal pain possibly due to lactic acidosis but with possible gallbladder infection.  Lactic acidosis was elevated (severe sepsis). RUQ U/S will be done in the morning Continue IV NS at 75 mLs/Hr Patient was started on IV metronidazole and aztreonam, we shall continue with same at this time Continue IV morphine 2 mg q.4h p.r.n. for moderate to severe pain Continue IV Zofran p.r.n. Continue clear liquid diet with plan to advance diet as tolerated Obtain blood culture x2 Surgery  will be consulted to follow up with patient in the morning  Elevated transaminitis in the setting of above  AST 485, ALT 163, ALP 232 Continue management as per above  Hypokalemia K+ 2.8, this will be replenished  Chronic systolic CHF Last EF measured in August 2021 was 27 to 45% Continue total input/output, daily weights   Hyperglycemia secondary to type 2 diabetes mellitus Continue insulin sliding scale and hypoglycemia protocol  Essential hypertension BP meds will be held at this time due to soft  BP  Hyperlipidemia Statin will be held at this time due to elevated liver enzymes  History of DVT and lupus anticoagulant Continue warfarin, INR at 2.0; continue to monitor INR  History of chronic respiratory failure Stable on trach collar  DVT prophylaxis: Warfarin  Code Status: Full code  Family Communication: None at bedside  Disposition Plan:  Patient is from:                        home Anticipated DC to:                   SNF or family members home Anticipated DC date:               2-3 days Anticipated DC barriers:          Unstable for discharge at this time due to abdominal pain possibly due to intra-abdominal infection with suspicion for cholecystitis requiring IV antibiotics at this time   Consults called: General surgery  Admission status: Inpatient    Bernadette Hoit MD [facility]  06/15/2020, 7:08 AM

## 2020-06-15 NOTE — ED Notes (Signed)
Date and time results received: 06/15/20 1:32 AM(use smartphrase ".now" to insert current time)  Test: lactic acid Critical Value: 2.8  Name of Provider Notified: Dr Christy Gentles  Orders Received? Or Actions Taken?: see chart

## 2020-06-15 NOTE — Progress Notes (Signed)
Pharmacy Antibiotic Note  Theresa [name] is a 54 y.o. female admitted on 06/14/2020 with sepsis, possible cholecystitis.  Pharmacy has been consulted for Aztreonam dosing.  Plan: Aztreonam 1 g IV q8h  Height: 4\' 10"  (147.3 cm) Weight: 99.8 kg (220 lb) IBW/kg (Calculated) : 40.9  Temp (24hrs), Avg:100.7 F (38.2 C), Min:100.7 F (38.2 C), Max:100.7 F (38.2 C)  Recent Labs  Lab 06/15/20 0014 06/15/20 0145  WBC 14.8*  --   CREATININE 0.90  --   LATICACIDVEN 2.8* 2.6*    Estimated Creatinine Clearance: 72.8 mL/min (by C-G formula based on SCr of 0.9 mg/dL).    Allergies  Allergen Reactions  . Penicillins Rash    Has patient had a PCN reaction causing immediate rash, facial/tongue/throat swelling, SOB or lightheadedness with hypotension: Yes Has patient had a PCN reaction causing severe rash involving mucus membranes or skin necrosis: No Has patient had a PCN reaction that required hospitalization No Has patient had a PCN reaction occurring within the last 10 years: No If all of the above answers are "NO", then may proceed with Cephalosporin use.   REACTION: rash  . Ciprofloxacin     nausea  . Ibuprofen Rash    swelling in leg      Caryl Pina 06/15/2020 7:22 AM

## 2020-06-15 NOTE — ED Notes (Signed)
Report called to [facility] 308.

## 2020-06-15 NOTE — Progress Notes (Signed)
PROGRESS NOTE  Theresa [name] ZOX:[id number] DOB: Sep 18, 1965 DOA: 06/14/2020 PCP: Monico Blitz, MD  Brief History:  54 year old female with systolic and diastolic CHF, COPD, continued tobacco abuse, diabetes mellitus type 2, lupus anticoagulant syndrome, coronary artery disease with history of STEMI, hypertension, chronic respiratory failure on 4 L, chronic tracheostomy presenting with 1 day history of abdominal pain with associated nausea and vomiting.  The patient began having nausea and nonbloody vomiting after eating a corn dog on 06/14/2020.  The patient denies any diarrhea, fevers, chills, headache.  She has had some shortness of breath over the last few days.  Unfortunately, she continues to smoke 1/2 pack/day.  She denies any hemoptysis.  She denies any worsening orthopnea or lower extremity edema. In the emergency department, the patient was febrile up to 100.7 F with soft blood pressures.  Systolic blood pressures were in the 90s to low 100s.  Oxygen saturation was 94% on 5 L.  CT of the abdomen and pelvis showed cholelithiasis without pericholecystic inflammation.  There was also interlobular septal thickening and groundglass opacities concerning for pulmonary edema.  The patient was started on cefepime and metronidazole.  General surgery was consulted to assist with management.  Assessment/Plan: Severe sepsis -Concerned about cholecystitis -Present on admission -Presented with fever, low blood pressure, and leukocytosis -Lactic acid peaked 2.8 -Check procalcitonin -Judicious IV fluids given the patient's CHF -Continue cefepime and metronidazole  Symptomatic cholelithiasis -Right upper quadrant ultrasound -06/15/2020 CT abdomen as discussed above  COPD exacerbation -Start Pulmicort -Start duo nebs -Start IV steroids  Acute on chronic respiratory failure with hypoxia -Secondary to COPD exacerbation in the setting of OHS/OSA -Chronically on 4 L nasal  cannula -Personally reviewed chest x-ray--increased interstitial markings, small bilateral pleural effusions  Chronic systolic and diastolic CHF -04/16/9146 echo EF 40-45%, grade 3 DD -Continue home dose torsemide -Holding losartan secondary to soft blood pressure -Holding metoprolol succinate secondary to soft blood pressure  Lupus anticoagulant syndrome -Holding Coumadin in anticipation for possible surgery -Start IV heparin bridge when INR is less than 2  Coronary artery disease -No chest pain presently  Uncontrolled diabetes mellitus type 2 with hyperglycemia -Start reduced dose Lantus -NovoLog sliding scale -Recheck hemoglobin A1c -holding Ozempic  Depression -Continue sertraline and home dose lorazepam  Hypokalemia -replete -check mag    Status is: Inpatient  Remains inpatient appropriate because:IV treatments appropriate due to intensity of illness or inability to take PO   Dispo: The patient is from: Home              Anticipated d/c is to: Home              Anticipated d/c date is: 3 days              Patient currently is not medically stable to d/c.        Family Communication: no  Family at bedside  Consultants:  General surgery  Code Status:  FULL   DVT Prophylaxis:  coumadin   Procedures: As Listed in Progress Note Above  Antibiotics: None  RN Pressure Injury Documentation:        Subjective:   Objective: Vitals:   06/15/20 0330 06/15/20 0400 06/15/20 0500 06/15/20 0550  BP: 96/63 108/66 111/80 106/65  Pulse: 97 96 97 88  Resp:  17 18 18   Temp:      TempSrc:      SpO2: 92% 91% 93% 94%  Weight:  Height:        Intake/Output Summary (Last 24 hours) at 06/15/2020 0845 Last data filed at 06/15/2020 0556 Gross per 24 hour  Intake 1193.87 ml  Output --  Net 1193.87 ml   Weight change:  Exam:   General:  Pt is alert, follows commands appropriately, not in acute distress  HEENT: No icterus, No thrush, No neck  mass, [place]/AT  Cardiovascular: RRR, S1/S2, no rubs, no gallops  Respiratory: CTA bilaterally, no wheezing, no crackles, no rhonchi  Abdomen: Soft/+BS, non tender, non distended, no guarding  Extremities: No edema, No lymphangitis, No petechiae, No rashes, no synovitis   Data Reviewed: I have personally reviewed following labs and imaging studies Basic Metabolic Panel: Recent Labs  Lab 06/15/20 0014  NA 138  K 2.8*  CL 88*  CO2 36*  GLUCOSE 232*  BUN 13  CREATININE 0.90  CALCIUM 8.5*   Liver Function Tests: Recent Labs  Lab 06/15/20 0014  AST 485*  ALT 163*  ALKPHOS 232*  BILITOT 3.0*  PROT 6.9  ALBUMIN 3.0*   Recent Labs  Lab 06/15/20 0014  LIPASE 25   No results for input(s): AMMONIA in the last 168 hours. Coagulation Profile: Recent Labs  Lab 06/15/20 0014  INR 2.0*   CBC: Recent Labs  Lab 06/15/20 0014  WBC 14.8*  NEUTROABS 13.6*  HGB 12.2  HCT 38.0  MCV 93.1  PLT 247   Cardiac Enzymes: No results for input(s): CKTOTAL, CKMB, CKMBINDEX, TROPONINI in the last 168 hours. BNP: Invalid input(s): POCBNP CBG: Recent Labs  Lab 06/15/20 0741  GLUCAP 190*   HbA1C: No results for input(s): HGBA1C in the last 72 hours. Urine analysis:    Component Value Date/Time   COLORURINE AMBER (A) 06/15/2020 0119   APPEARANCEUR CLEAR 06/15/2020 0119   LABSPEC 1.014 06/15/2020 0119   PHURINE 6.0 06/15/2020 0119   GLUCOSEU NEGATIVE 06/15/2020 0119   HGBUR NEGATIVE 06/15/2020 0119   BILIRUBINUR SMALL (A) 06/15/2020 0119   KETONESUR NEGATIVE 06/15/2020 0119   PROTEINUR 30 (A) 06/15/2020 0119   NITRITE NEGATIVE 06/15/2020 0119   LEUKOCYTESUR NEGATIVE 06/15/2020 0119   Sepsis Labs: @LABRCNTIP (procalcitonin:4,lacticidven:4) ) Recent Results (from the past 240 hour(s))  Respiratory Panel by RT PCR (Flu A&B, Covid) - Nasopharyngeal Swab     Status: None   Collection Time: 06/15/20 12:31 AM   Specimen: Nasopharyngeal Swab  Result Value Ref Range Status    SARS Coronavirus 2 by RT PCR NEGATIVE NEGATIVE Final    Comment: (NOTE) SARS-CoV-2 target nucleic acids are NOT DETECTED.  The SARS-CoV-2 RNA is generally detectable in upper respiratoy specimens during the acute phase of infection. The lowest concentration of SARS-CoV-2 viral copies this assay can detect is 131 copies/mL. A negative result does not preclude SARS-Cov-2 infection and should not be used as the sole basis for treatment or other patient management decisions. A negative result may occur with  improper specimen collection/handling, submission of specimen other than nasopharyngeal swab, presence of viral mutation(s) within the areas targeted by this assay, and inadequate number of viral copies (<131 copies/mL). A negative result must be combined with clinical observations, patient history, and epidemiological information. The expected result is Negative.  Fact Sheet for Patients:  PinkCheek.be  Fact Sheet for Healthcare Providers:  GravelBags.it  This test is no t yet approved or cleared by the [place] FDA and  has been authorized for detection and/or diagnosis of SARS-CoV-2 by FDA under an Emergency Use Authorization (EUA). This EUA will remain  in effect (meaning this test can be used) for the duration of the COVID-19 declaration under Section 564(b)(1) of the Act, 21 U.S.C. section 360bbb-3(b)(1), unless the authorization is terminated or revoked sooner.     Influenza A by PCR NEGATIVE NEGATIVE Final   Influenza B by PCR NEGATIVE NEGATIVE Final    Comment: (NOTE) The Xpert Xpress SARS-CoV-2/FLU/RSV assay is intended as an aid in  the diagnosis of influenza from Nasopharyngeal swab specimens and  should not be used as a sole basis for treatment. Nasal washings and  aspirates are unacceptable for Xpert Xpress SARS-CoV-2/FLU/RSV  testing.  Fact Sheet for  Patients: PinkCheek.be  Fact Sheet for Healthcare Providers: GravelBags.it  This test is not yet approved or cleared by the [place] FDA and  has been authorized for detection and/or diagnosis of SARS-CoV-2 by  FDA under an Emergency Use Authorization (EUA). This EUA will remain  in effect (meaning this test can be used) for the duration of the  Covid-19 declaration under Section 564(b)(1) of the Act, 21  U.S.C. section 360bbb-3(b)(1), unless the authorization is  terminated or revoked. Performed at [facility], [address], [place], [place] [address]      Scheduled Meds: . insulin aspart  0-15 Units Subcutaneous TID WC  . insulin aspart  0-5 Units Subcutaneous QHS  . pantoprazole  20 mg Oral Daily  . potassium chloride  40 mEq Oral Once  . warfarin  2.5-5 mg Oral [id number]   Continuous Infusions: . ceFEPime (MAXIPIME) IV    . lactated ringers 150 mL/hr at 06/15/20 0206  . metronidazole    . potassium chloride      Procedures/Studies: CT ABDOMEN PELVIS W CONTRAST  Result Date: 06/15/2020 CLINICAL DATA:  Acute abdominal pain, nonlocalized.  Cholelithiasis. EXAM: CT ABDOMEN AND PELVIS WITH CONTRAST TECHNIQUE: Multidetector CT imaging of the abdomen and pelvis was performed using the standard protocol following bolus administration of intravenous contrast. CONTRAST:  168mL OMNIPAQUE IOHEXOL 300 MG/ML  SOLN COMPARISON:  04/23/2019 FINDINGS: Lower chest: The visualized lung bases demonstrates interlobular septal thickening and ground-glass pulmonary infiltrate likely representing pulmonary edema, possibly cardiogenic in nature. This appears improved since prior CT examination of 04/18/2020. Coronary artery stenting has been performed. Cardiac size is mildly enlarged. No pericardial effusion. Hepatobiliary: Cholelithiasis. No pericholecystic inflammatory change identified. Liver unremarkable. No intra or extrahepatic biliary  ductal dilation. Pancreas: Unremarkable Spleen: Unremarkable Adrenals/Urinary Tract: Multiple bilateral adrenal nodules are unchanged measuring up to 2.5 cm within the left adrenal gland and compatible with multiple adenoma, better evaluated on prior examination. Kidneys are unremarkable. Bladder is unremarkable. Stomach/Bowel: Stomach, small bowel, and large bowel are unremarkable. Appendix normal. No free intraperitoneal gas or fluid. Vascular/Lymphatic: Extensive aortoiliac atherosclerotic calcification is present. No aortic aneurysm. No pathologic adenopathy within the abdomen and pelvis. Reproductive: Uterus and bilateral adnexa are unremarkable. Other: Rectum unremarkable Musculoskeletal: No acute bone abnormality. Remote compression fracture of T9 is unchanged. Superior endplate fracture of L3 is unchanged. IMPRESSION: Bibasilar pulmonary infiltrates and septal thickening in keeping with mild to moderate pulmonary edema, possibly cardiogenic in nature. This appears improved. Cholelithiasis without CT evidence of acute cholecystitis. Multiple stable adrenal adenoma. Biochemical correlation may be helpful to assess for hyperfunctioning. Aortic Atherosclerosis (ICD10-I70.0). Electronically Signed   By: Fidela Salisbury MD   On: 06/15/2020 04:03   DG Chest Port 1 View  Result Date: 06/15/2020 CLINICAL DATA:  Shortness of breath EXAM: PORTABLE CHEST 1 VIEW COMPARISON:  04/17/2020 FINDINGS: The tracheostomy tube terminates above the  carina. The patient is status post prior median sternotomy. The heart size is enlarged. Coarse hazy bilateral airspace opacities are noted. These have improved since the prior study. There is architectural distortion and areas of scarring, similar to prior study. There is blunting of the costophrenic angles bilaterally similar to prior study. There is no acute osseous abnormality. IMPRESSION: Cardiomegaly with chronic bilateral airspace opacities. No definite acute cardiopulmonary  process. Electronically Signed   By: Constance Holster M.D.   On: 06/15/2020 01:02    Orson Eva, DO  [facility]  If 7PM-7AM, please contact night-coverage [email or web address] Password [email or web address] 06/15/2020, 8:45 AM   LOS: 0 days

## 2020-06-15 NOTE — ED Notes (Signed)
Blood cultures being drawn at this time 

## 2020-06-15 NOTE — Progress Notes (Signed)
Pharmacy Antibiotic Note  Theresa [name] is a 54 y.o. female admitted on 06/14/2020 with sepsis and possible cholecystitis.  Pharmacy has been consulted for cefepime dosing.  Patient had been on aztreonam for PCN allergy, but she has had several courses of cefepime in the past.  Plan: Discontinue aztreonam. Start cefepime 2g IV q8h Pharmacy will continue to monitor renal function, cultures and patient progress.   Height: 4\' 10"  (147.3 cm) Weight: 99.8 kg (220 lb) IBW/kg (Calculated) : 40.9  Temp (24hrs), Avg:100.7 F (38.2 C), Min:100.7 F (38.2 C), Max:100.7 F (38.2 C)  Recent Labs  Lab 06/15/20 0014 06/15/20 0145  WBC 14.8*  --   CREATININE 0.90  --   LATICACIDVEN 2.8* 2.6*    Estimated Creatinine Clearance: 72.8 mL/min (by C-G formula based on SCr of 0.9 mg/dL).    Allergies  Allergen Reactions  . Penicillins Rash    Has patient had a PCN reaction causing immediate rash, facial/tongue/throat swelling, SOB or lightheadedness with hypotension: Yes Has patient had a PCN reaction causing severe rash involving mucus membranes or skin necrosis: No Has patient had a PCN reaction that required hospitalization No Has patient had a PCN reaction occurring within the last 10 years: No If all of the above answers are "NO", then may proceed with Cephalosporin use.   REACTION: rash  . Ciprofloxacin     nausea  . Ibuprofen Rash    swelling in leg     Antimicrobials this admission: aztreonam 10/10 >> 10/10 cefepime 10/10 >>   Metronidazole 10/10>>   Microbiology results: 10/10  BC x2:   10/10 UCx:   10/10 Resp PCR: SARS CoV-2 negative; Flu A/B negative    Thank you for allowing pharmacy to be a part of this patient's care.  [name] 06/15/2020 10:31 AM

## 2020-06-15 NOTE — Consult Note (Signed)
[name], M.D. Gastroenterology & Hepatology                                           Patient Name: Theresa [name] Account #: @FLAACCTNO @   MRN: [id number] Admission Date: 06/14/2020 Date of Evaluation:  06/15/2020 Time of Evaluation: 12:36 PM  Referring Physician: Orson Eva, MD  Chief Complaint:  Abdominal pain, elevated LFTs  HPI:   Theresa [name] is a 54 y.o. female with systolic and diastolic CHF (most recent ejection fraction of 40 to 45% with grade 3 diastolic dysfunction), DVTs on chronic treatment with Coumadin, COPD, continued tobacco abuse, diabetes mellitus type 2, lupus anticoagulant syndrome, coronary artery disease with history of STEMI and CABG, hypertension, chronic respiratory failure on 4 L intermittently, chronic tracheostomy, who presents for evaluation of new onset abdominal pain, nausea and vomiting.  The patient states that since yesterday she presented new onset of severe right upper quadrant abdominal pain that radiated to her back described as a sharp persistent pain.  She also presented episodes of nausea and persistent bilious vomiting multiple times.  She checked her temperature at home and she was 98 degrees but she has felt warmer during the day and has been sweating profusely.  She denies having any similar symptoms in the previous weeks. The patient denies having any hematochezia, melena, hematemesis,  diarrhea, jaundice, pruritus or weight loss.  Has not noticed any changes in the color of her stool.  Of note, the patient had a history of cholelithiasis in the past.  She was referred to [facility] to have a cholecystectomy for symptomatic gallstones in 2018.  The patient reports that she did not have this performed as she was told that she was a high risk surgical candidate as she had active cardiac disease at the moment.  In the ED, she was borderline tachycardic with HR 98 and oscillating SBP in the 90-100s. she was febrile 100.7. Labs were remarkable  for leukocytosis 14.8k with other cell lines WNL, increased lactic acid of 2.8, also presence of severe derangement in her liver enzymes with an AST of 485, ALT 163, alkaline phosphatase 232, total bilirubin 3.0, electrolytes showed hypokalemia of 2.8 with rest of electrolytes within normal limits, normal renal function, normal lipase of 25.  Her INR is 2.0.  Urinalysis was normal.  CT of the abdomen with IV contrast showed presence of cholelithiasis without presence of thickening of her gallbladder, the biliary system did not show any intrahepatic or extrahepatic ductal dilation (I evaluated these images myself).  There was presence of bibasilar pulmonary infiltrates and septal thickening likely cardiogenic.  Abdominal ultrasound showed cholelithiasis without presence of findings consistent with cholecystitis.  FHx: neg for any gastrointestinal/liver disease, no malignancies Social: chronic smoker, denies alcohol or illicit drug use   Past Medical History: SEE CHRONIC ISSSUES: Past Medical History:  Diagnosis Date  . Acute kidney failure with lesion of tubular necrosis (HCC)   . Acute systolic heart failure ([place])   . CAD (coronary artery disease)    a. s/p prior PCI. b. CABG 2007 at [facility] in [place] 2007. c. inferior STEMI 10/2015 s/p DES to dSVG-PDA.  . Cardiac arrest ([place])   . Cervical cancer ([place])   . Chronic diastolic CHF (congestive heart failure) ([place])   . Chronic respiratory failure ([place])    s/p tracheostomy 2002  . Chronic RUQ  pain   . COPD (chronic obstructive pulmonary disease) ([place])   . Diabetes mellitus ([place])   . DVT (deep venous thrombosis) ([place])   . Endometriosis   . History of gallstones 01/2016   seen on Ultrasound  . History of HIDA scan 11/2016   normal  . HTN (hypertension)   . Hyperlipidemia   . Lupus anticoagulant disorder (HCC)    on coumadin  . Morbid obesity ([place])   . ST elevation (STEMI) myocardial infarction involving right coronary artery ([place])  10/29/15   stent to VG to PDA  . Toe fracture, right 03/29/2018  . Tracheostomy in place [facility]), chronic since 2002 11/03/2015  . Tracheostomy status [facility])    Past Surgical History:  Past Surgical History:  Procedure Laterality Date  . CARDIAC CATHETERIZATION N/A 10/29/2015   Procedure: Left Heart Cath and Cors/Grafts Angiography;  Surgeon: Burnell Blanks, MD;  Location: [place] [facility];  Service: Cardiovascular;  Laterality: N/A;  . CARDIAC CATHETERIZATION  10/29/2015   Procedure: Coronary Stent Intervention;  Surgeon: Burnell Blanks, MD;  Location: [place] [facility];  Service: Cardiovascular;;  . CAROTID STENT    . CESAREAN SECTION WITH BILATERAL TUBAL LIGATION    . CORONARY ARTERY BYPASS GRAFT  2007   2V  . IR GASTROSTOMY TUBE MOD SED  11/13/2018  . RIGHT/LEFT HEART CATH AND CORONARY/GRAFT ANGIOGRAPHY N/A 03/24/2018   Procedure: RIGHT/LEFT HEART CATH AND CORONARY/GRAFT ANGIOGRAPHY;  Surgeon: Belva Crome, MD;  Location: [place] [facility];  Service: Cardiovascular;  Laterality: N/A;  . TRACHEOSTOMY     Family History:  Family History  Problem Relation Age of Onset  . Hypertension Mother   . Diabetes Mother   . Hypertension Father   . Diabetes Father    Social History:  Social History   Tobacco Use  . Smoking status: Current Every Day Smoker    Packs/day: 0.75    Types: Cigarettes    Start date: 10/23/1978  . Smokeless tobacco: Never Used  . Tobacco comment: 1/2 pack - trying to cut back   Vaping Use  . Vaping Use: Never used  Substance Use Topics  . Alcohol use: No    Alcohol/week: 0.0 standard drinks  . Drug use: No    Home Medications:  Prior to Admission medications   Medication Sig Start Date End Date Taking? Authorizing Provider  albuterol (PROVENTIL) (2.5 MG/3ML) 0.083% nebulizer solution Take 3 mLs (2.5 mg total) by nebulization 2 (two) times daily. Patient taking differently: Take 2.5 mg by nebulization every 4 (four) hours as needed for  wheezing.  10/19/18  Yes Erick Colace, NP  albuterol (VENTOLIN HFA) 108 (90 Base) MCG/ACT inhaler Inhale 2 puffs into the lungs every 4 (four) hours as needed for wheezing or shortness of breath.   Yes [provider]  atorvastatin (LIPITOR) 80 MG tablet Take 1 tablet (80 mg total) by mouth daily. 05/09/20  Yes Sande Rives E, PA-C  gabapentin (NEURONTIN) 300 MG capsule Take 1 capsule (36m) in the morning, 1 capsule (3082m in the afternoon, and 2 capsules (60033mat bedtime Patient taking differently: Take 900 mg by mouth at bedtime.  01/19/19  Yes GooSarajane Jewsallie E, PA-C  insulin degludec (TRESIBA FLEXTOUCH) 100 UNIT/ML FlexTouch Pen Inject 50 Units into the skin at bedtime.    Yes [provider]  LORazepam (ATIVAN) 0.5 MG tablet Take 0.5 mg by mouth every 6 (six) hours as needed for anxiety.    Yes [provider]  losartan (COZAAR) 25 MG tablet Take 1 tablet (25 mg total) by mouth daily. 01/20/19  Yes Sande Rives E, PA-C  metoprolol succinate (TOPROL-XL) 50 MG 24 hr tablet Take 1 tablet (50 mg total) by mouth daily. 07/27/19  Yes [name], MD  nitroGLYCERIN (NITROSTAT) 0.4 MG SL tablet Place 0.4 mg under the tongue every 5 (five) minutes as needed for chest pain.   Yes [provider]  NOVOLOG FLEXPEN 100 UNIT/ML FlexPen Inject 1-10 Units into the skin 3 (three) times daily before meals. 04/06/20  Yes [provider]  OZEMPIC, 0.25 OR 0.5 MG/DOSE, 2 MG/1.5ML SOPN Inject 0.5 mg into the skin every Wednesday.  04/08/19  Yes [provider]  pantoprazole (PROTONIX) 20 MG tablet Take 20 mg by mouth daily. 02/16/20  Yes [provider]  potassium chloride SA (KLOR-CON) 20 MEQ tablet Take 20 mEq by mouth daily. 04/17/20  Yes [provider]  sertraline (ZOLOFT) 50 MG tablet Take 50 mg by mouth at bedtime.    Yes [provider]  tamsulosin (FLOMAX) 0.4 MG CAPS capsule Take 0.4 mg by mouth daily.   Yes [provider]  torsemide (DEMADEX) 20 MG tablet Take 2 tablets (40 mg total) by mouth 2 (two) times daily. 04/25/20  Yes Charlynne Cousins, MD  traMADol (ULTRAM) 50 MG tablet Take 50 mg by mouth 3 (three) times daily as needed for moderate pain.  03/29/20  Yes [provider]  warfarin (COUMADIN) 2.5 MG tablet Take 2 tablets (5 mg) tonight at 6 PM.  Go to your PCP for INR tomorrow. Patient taking differently: Take 2.5-5 mg by mouth See admin instructions. Mon,Wed,Fri take 29m  2.530mall other days 05/17/19  Yes GoMercy RidingMD    Inpatient Medications:  Current Facility-Administered Medications:  .  albuterol (PROVENTIL) (2.5 MG/3ML) 0.083% nebulizer solution 2.5 mg, 2.5 mg, Nebulization, Q4H PRN, Adefeso, Oladapo, DO .  albuterol (VENTOLIN HFA) 108 (90 Base) MCG/ACT inhaler 2 puff, 2 puff, Inhalation, Q4H PRN, Adefeso, Oladapo, DO .  budesonide (PULMICORT) nebulizer solution 0.5 mg, 0.5 mg, Nebulization, BID, Tat, David, MD, 0.5 mg at 06/15/20 0928 .  ceFEPIme (MAXIPIME) 2 g in sodium chloride 0.9 % 100 mL IVPB, 2 g, Intravenous, Q8H, Tat, David, MD .  [SDerrill MemoN 06/16/2020] influenza vac split quadrivalent PF (FLUARIX) injection 0.5 mL, 0.5 mL, Intramuscular, Tomorrow-1000, Tat, David, MD .  insulin aspart (novoLOG) injection 0-15 Units, 0-15 Units, Subcutaneous, TID WC, Adefeso, Oladapo, DO, 3 Units at 06/15/20 0859 .  insulin aspart (novoLOG) injection 0-5 Units, 0-5 Units, Subcutaneous, QHS, Adefeso, Oladapo, DO .  insulin glargine (LANTUS) injection 10 Units, 10 Units, Subcutaneous, Daily, Tat, David, MD .  ipratropium-albuterol (DUONEB) 0.5-2.5 (3) MG/3ML nebulizer solution 3 mL, 3 mL, Nebulization, Q6H, Tat, David, MD, 3 mL at 06/15/20 0928 .  lactated ringers infusion, , Intravenous, Continuous, Adefeso, Oladapo, DO, Stopped at 06/15/20 0859 .  methylPREDNISolone sodium succinate (SOLU-MEDROL) 125 mg/2 mL injection 60 mg, 60 mg, Intravenous, Q12H, Tat, David, MD, 60 mg at  06/15/20 1101 .  metroNIDAZOLE (FLAGYL) IVPB 500 mg, 500 mg, Intravenous, Q8H, Adefeso, Oladapo, DO .  pantoprazole (PROTONIX) injection 40 mg, 40 mg, Intravenous, Q12H, Tat, David, MD, 40 mg at 06/15/20 1101 .  torsemide (DEMADEX) tablet 40 mg, 40 mg, Oral, Daily, Tat, David, MD .  traMADol (UVeatrice Bourbontablet 50 mg, 50 mg, Oral, Q6H PRN, Tat, David, MD, 50 mg at 06/15/20 1102  Current Outpatient Medications:  .  albuterol (  PROVENTIL) (2.5 MG/3ML) 0.083% nebulizer solution, Take 3 mLs (2.5 mg total) by nebulization 2 (two) times daily. (Patient taking differently: Take 2.5 mg by nebulization every 4 (four) hours as needed for wheezing. ), Disp: 75 mL, Rfl: 12 .  albuterol (VENTOLIN HFA) 108 (90 Base) MCG/ACT inhaler, Inhale 2 puffs into the lungs every 4 (four) hours as needed for wheezing or shortness of breath., Disp: , Rfl:  .  atorvastatin (LIPITOR) 80 MG tablet, Take 1 tablet (80 mg total) by mouth daily., Disp: 90 tablet, Rfl: 1 .  gabapentin (NEURONTIN) 300 MG capsule, Take 1 capsule (340m) in the morning, 1 capsule (3064m in the afternoon, and 2 capsules (60077mat bedtime (Patient taking differently: Take 900 mg by mouth at bedtime. ), Disp: 120 capsule, Rfl: 2 .  insulin degludec (TRESIBA FLEXTOUCH) 100 UNIT/ML FlexTouch Pen, Inject 50 Units into the skin at bedtime. , Disp: , Rfl:  .  LORazepam (ATIVAN) 0.5 MG tablet, Take 0.5 mg by mouth every 6 (six) hours as needed for anxiety. , Disp: , Rfl:  .  losartan (COZAAR) 25 MG tablet, Take 1 tablet (25 mg total) by mouth daily., Disp: 30 tablet, Rfl: 2 .  metoprolol succinate (TOPROL-XL) 50 MG 24 hr tablet, Take 1 tablet (50 mg total) by mouth daily., Disp: 90 tablet, Rfl: 3 .  nitroGLYCERIN (NITROSTAT) 0.4 MG SL tablet, Place 0.4 mg under the tongue every 5 (five) minutes as needed for chest pain., Disp: , Rfl:  .  NOVOLOG FLEXPEN 100 UNIT/ML FlexPen, Inject 1-10 Units into the skin 3 (three) times daily before meals., Disp: , Rfl:  .   OZEMPIC, 0.25 OR 0.5 MG/DOSE, 2 MG/1.5ML SOPN, Inject 0.5 mg into the skin every Wednesday. , Disp: , Rfl:  .  pantoprazole (PROTONIX) 20 MG tablet, Take 20 mg by mouth daily., Disp: , Rfl:  .  potassium chloride SA (KLOR-CON) 20 MEQ tablet, Take 20 mEq by mouth daily., Disp: , Rfl:  .  sertraline (ZOLOFT) 50 MG tablet, Take 50 mg by mouth at bedtime. , Disp: , Rfl:  .  tamsulosin (FLOMAX) 0.4 MG CAPS capsule, Take 0.4 mg by mouth daily., Disp: , Rfl:  .  torsemide (DEMADEX) 20 MG tablet, Take 2 tablets (40 mg total) by mouth 2 (two) times daily., Disp: 60 tablet, Rfl: 3 .  traMADol (ULTRAM) 50 MG tablet, Take 50 mg by mouth 3 (three) times daily as needed for moderate pain. , Disp: , Rfl:  .  warfarin (COUMADIN) 2.5 MG tablet, Take 2 tablets (5 mg) tonight at 6 PM.  Go to your PCP for INR tomorrow. (Patient taking differently: Take 2.5-5 mg by mouth See admin instructions. Mon,Wed,Fri take 5mg70m.5mg 42m other days), Disp: , Rfl:  Allergies: Penicillins, Ciprofloxacin, and Ibuprofen  Complete Review of Systems: GENERAL: negative for malaise, night sweats HEENT: No changes in hearing or vision, no nose bleeds or other nasal problems. NECK: Negative for lumps, goiter, pain and significant neck swelling RESPIRATORY: Negative for cough, wheezing CARDIOVASCULAR: Negative for chest pain, leg swelling, palpitations, orthopnea GI: SEE HPI MUSCULOSKELETAL: Negative for joint pain or swelling, back pain, and muscle pain. SKIN: Negative for lesions, rash PSYCH: Negative for sleep disturbance, mood disorder and recent psychosocial stressors. HEMATOLOGY Negative for prolonged bleeding, bruising easily, and swollen nodes. ENDOCRINE: Negative for cold or heat intolerance, polyuria, polydipsia and goiter. NEURO: negative for tremor, gait imbalance, syncope and seizures. The remainder of the review of systems is noncontributory.  Physical Exam: BP  106/65   Pulse 88   Temp (!) 100.7 F (38.2 C) (Oral)    Resp 18   Ht 4' 10"  (1.473 m)   Wt 99.8 kg   SpO2 92%   BMI 45.98 kg/m  GENERAL: The patient is AO x3, in moderate distress.  Obese. Patient is sweating and has erythematous cheeks.  She is using supplemental oxygen by nasal: A. HEENT: Head is normocephalic and atraumatic. EOMI are intact. Mouth is well hydrated and without lesions.  Patient had a trach collar NECK: Supple. No masses LUNGS: Decreased breath sounds in both lung fields. No presence of rhonchi/wheezing/rales. HEART: Tachycardic but rhythmic, normal s1 and s2. ABDOMEN: Tender upon palpation of the right upper quadrant and epigastric area, no guarding, no peritoneal signs, and nondistended. BS +. No masses. EXTREMITIES: Without any cyanosis, clubbing, rash, lesions or edema. NEUROLOGIC: AOx3, no focal motor deficit. SKIN: no jaundice, no rashes  Laboratory Data CBC:     Component Value Date/Time   WBC 14.8 (H) 06/15/2020 0014   RBC 4.08 06/15/2020 0014   HGB 12.2 06/15/2020 0014   HCT 38.0 06/15/2020 0014   PLT 247 06/15/2020 0014   MCV 93.1 06/15/2020 0014   MCH 29.9 06/15/2020 0014   MCHC 32.1 06/15/2020 0014   RDW 14.8 06/15/2020 0014   LYMPHSABS 0.4 (L) 06/15/2020 0014   MONOABS 0.7 06/15/2020 0014   EOSABS 0.0 06/15/2020 0014   BASOSABS 0.0 06/15/2020 0014   COAG:  Lab Results  Component Value Date   INR 2.0 (H) 06/15/2020   INR 2.0 (H) 04/25/2020   INR 1.9 (H) 04/24/2020    BMP:  BMP Latest Ref Rng & Units 06/15/2020 05/05/2020 04/24/2020  Glucose 70 - 99 mg/dL 232(H) 141(H) 115(H)  BUN 6 - 20 mg/dL 13 25(H) 26(H)  Creatinine 0.44 - 1.00 mg/dL 0.90 0.81 1.02(H)  BUN/Creat Ratio 9 - 23 - 31(H) -  Sodium 135 - 145 mmol/L 138 140 138  Potassium 3.5 - 5.1 mmol/L 2.8(L) 4.3 4.6  Chloride 98 - 111 mmol/L 88(L) 94(L) 92(L)  CO2 22 - 32 mmol/L 36(H) 30(H) 37(H)  Calcium 8.9 - 10.3 mg/dL 8.5(L) 9.1 9.2    HEPATIC:  Hepatic Function Latest Ref Rng & Units 06/15/2020 05/05/2020 05/17/2019  Total Protein 6.5 -  8.1 g/dL 6.9 7.1 5.8(L)  Albumin 3.5 - 5.0 g/dL 3.0(L) 3.9 2.5(L)  AST 15 - 41 U/L 485(H) 12 31  ALT 0 - 44 U/L 163(H) 14 21  Alk Phosphatase 38 - 126 U/L 232(H) 153(H) 83  Total Bilirubin 0.3 - 1.2 mg/dL 3.0(H) 0.4 0.7  Bilirubin, Direct 0.0 - 0.2 mg/dL - - -    CARDIAC:  Lab Results  Component Value Date   CKTOTAL 144 11/01/2015   CKMB 5.7 (H) 11/01/2015   TROPONINI <0.03 01/12/2019     Imaging: I personally reviewed and interpreted the available imaging.  Assessment & Plan: CHEROLYN BEHRLE is a 54 y.o. female with systolic and diastolic CHF (most recent ejection fraction of 40 to 45% with grade 3 diastolic dysfunction), DVTs on chronic treatment with Coumadin, COPD, continued tobacco abuse, diabetes mellitus type 2, lupus anticoagulant syndrome, coronary artery disease with history of STEMI and CABG, hypertension, chronic respiratory failure on 4 L intermittently, chronic tracheostomy, who presents for evaluation of new onset abdominal pain, nausea and vomiting.  Patient is presenting sepsis of biliary origin.,  Is being treated for cholangitis.  It is unclear if she has a component of cholecystitis as there is  some mild thickening of her gallbladder wall.  At this point, she needs to continue on antibiotic coverage and careful intravascular resuscitation.  It would be important to determine if she has choledocholithiasis with an MRCP as this will help delineate the plan for source control, since she is having elevated liver enzymes but her CT abdomen does not show presence of biliary ductal dilation.  Overall, the patient is a poor surgical and procedural candidate given her chronic respiratory failure and heart disease, which increase her risk for periprocedural and anesthetic complications.  Holding Coumadin at this moment is suggested in case the patient needs to undergo a procedure in the future.  # Cholangitis # Elevated LFTs - CLD for now - IVF for intravascular resuscitation -  titrate based on HF - c/w cefepime and flagyl IV - f/u BCx - Trend LFTs and WBC daily - MRCP - Low threshold for MICU screen if patient HD unstable - Follow surgery recs regarding gallbladder - Hold coumadin for now  Harvel Quale, MD Gastroenterology and Hepatology [facility]   Note: Occasional unusual wording and randomly placed punctuation marks may result from the use of speech recognition technology to transcribe this document

## 2020-06-15 NOTE — ED Notes (Signed)
Date and time results received: 06/15/20 3:40 AM(use smartphrase ".now" to insert current time)  Test: lactic acid Critical Value: 2.6  Name of Provider Notified: Dr Christy Gentles  Orders Received? Or Actions Taken?: see chart

## 2020-06-15 NOTE — ED Notes (Signed)
Potassium rate slowed to 50 due to pt complaining of burning.  NS KVO running with K+

## 2020-06-16 ENCOUNTER — Encounter (HOSPITAL_COMMUNITY): Payer: Self-pay | Admitting: Internal Medicine

## 2020-06-16 ENCOUNTER — Inpatient Hospital Stay (HOSPITAL_COMMUNITY): Payer: Medicare Other

## 2020-06-16 ENCOUNTER — Inpatient Hospital Stay: Payer: Self-pay

## 2020-06-16 DIAGNOSIS — A4151 Sepsis due to Escherichia coli [E. coli]: Secondary | ICD-10-CM

## 2020-06-16 DIAGNOSIS — R6520 Severe sepsis without septic shock: Secondary | ICD-10-CM

## 2020-06-16 DIAGNOSIS — A419 Sepsis, unspecified organism: Secondary | ICD-10-CM

## 2020-06-16 DIAGNOSIS — K8050 Calculus of bile duct without cholangitis or cholecystitis without obstruction: Secondary | ICD-10-CM | POA: Diagnosis not present

## 2020-06-16 DIAGNOSIS — R7881 Bacteremia: Secondary | ICD-10-CM

## 2020-06-16 LAB — COMPREHENSIVE METABOLIC PANEL
ALT: 147 U/L — ABNORMAL HIGH (ref 0–44)
AST: 127 U/L — ABNORMAL HIGH (ref 15–41)
Albumin: 2.7 g/dL — ABNORMAL LOW (ref 3.5–5.0)
Alkaline Phosphatase: 227 U/L — ABNORMAL HIGH (ref 38–126)
Anion gap: 11 (ref 5–15)
BUN: 22 mg/dL — ABNORMAL HIGH (ref 6–20)
CO2: 33 mmol/L — ABNORMAL HIGH (ref 22–32)
Calcium: 8.3 mg/dL — ABNORMAL LOW (ref 8.9–10.3)
Chloride: 93 mmol/L — ABNORMAL LOW (ref 98–111)
Creatinine, Ser: 1.02 mg/dL — ABNORMAL HIGH (ref 0.44–1.00)
GFR, Estimated: >60 mL/min (ref >60)
Glucose, Bld: 370 mg/dL — ABNORMAL HIGH (ref 70–99)
Potassium: 3.8 mmol/L (ref 3.5–5.1)
Sodium: 137 mmol/L (ref 135–145)
Total Bilirubin: 5.3 mg/dL — ABNORMAL HIGH (ref 0.3–1.2)
Total Protein: 6.9 g/dL (ref 6.5–8.1)

## 2020-06-16 LAB — HEMOGLOBIN A1C
Hgb A1c MFr Bld: 8.9 % — ABNORMAL HIGH (ref 4.8–5.6)
Mean Plasma Glucose: 208.73 mg/dL

## 2020-06-16 LAB — CBC
HCT: 35 % — ABNORMAL LOW (ref 36.0–46.0)
Hemoglobin: 11.3 g/dL — ABNORMAL LOW (ref 12.0–15.0)
MCH: 30.7 pg (ref 26.0–34.0)
MCHC: 32.3 g/dL (ref 30.0–36.0)
MCV: 95.1 fL (ref 80.0–100.0)
Platelets: 214 10*3/uL (ref 150–400)
RBC: 3.68 MIL/uL — ABNORMAL LOW (ref 3.87–5.11)
RDW: 15.9 % — ABNORMAL HIGH (ref 11.5–15.5)
WBC: 10.1 10*3/uL (ref 4.0–10.5)
nRBC: 0 % (ref 0.0–0.2)

## 2020-06-16 LAB — PROTIME-INR
INR: 2 — ABNORMAL HIGH (ref 0.8–1.2)
Prothrombin Time: 21.7 seconds — ABNORMAL HIGH (ref 11.4–15.2)

## 2020-06-16 LAB — URINE CULTURE: Culture: NO GROWTH

## 2020-06-16 LAB — BRAIN NATRIURETIC PEPTIDE: B Natriuretic Peptide: 920 pg/mL — ABNORMAL HIGH (ref 0.0–100.0)

## 2020-06-16 LAB — APTT: aPTT: 51 seconds — ABNORMAL HIGH (ref 24–36)

## 2020-06-16 LAB — GLUCOSE, CAPILLARY
Glucose-Capillary: 366 mg/dL — ABNORMAL HIGH (ref 70–99)
Glucose-Capillary: 386 mg/dL — ABNORMAL HIGH (ref 70–99)
Glucose-Capillary: 229 mg/dL — ABNORMAL HIGH (ref 70–99)
Glucose-Capillary: 210 mg/dL — ABNORMAL HIGH (ref 70–99)

## 2020-06-16 LAB — PHOSPHORUS: Phosphorus: 3.4 mg/dL (ref 2.5–4.6)

## 2020-06-16 LAB — MAGNESIUM: Magnesium: 1.7 mg/dL (ref 1.7–2.4)

## 2020-06-16 MED ORDER — SODIUM CHLORIDE 0.9 % IV SOLN
INTRAVENOUS | Status: DC | PRN
  Administered 2020-06-16: 500 mL via INTRAVENOUS

## 2020-06-16 MED ORDER — FUROSEMIDE 10 MG/ML IJ SOLN
60.0000 mg | Freq: Two times a day (BID) | INTRAMUSCULAR | Status: DC
  Administered 2020-06-16 – 2020-06-20 (×8): 60 mg via INTRAVENOUS
  Filled 2020-06-16 (×8): qty 6

## 2020-06-16 MED ORDER — GADOBUTROL 1 MMOL/ML IV SOLN
10.0000 mL | Freq: Once | INTRAVENOUS | Status: AC | PRN
  Administered 2020-06-16: 10 mL via INTRAVENOUS

## 2020-06-16 MED ORDER — INSULIN GLARGINE 100 UNIT/ML ~~LOC~~ SOLN
25.0000 [IU] | Freq: Every day | SUBCUTANEOUS | Status: DC
  Administered 2020-06-17 – 2020-06-18 (×2): 25 [IU] via SUBCUTANEOUS
  Filled 2020-06-16 (×4): qty 0.25

## 2020-06-16 NOTE — Progress Notes (Signed)
Spoke with Dr Tat regarding PICC order and positive blood cultures.  States to hold off on PICC for now

## 2020-06-16 NOTE — Progress Notes (Signed)
Subjective: Patient states her abdominal pain has somewhat eased, though she still has right upper quadrant abdominal pain.  Objective: Vital signs in last 24 hours: Temp:  [97.6 F (36.4 C)-98.4 F (36.9 C)] 98.1 F (36.7 C) (10/11 0518) Pulse Rate:  [80-93] 93 (10/11 0518) Resp:  [18-26] 19 (10/11 0518) BP: (93-138)/(56-83) 93/61 (10/11 0518) SpO2:  [85 %-96 %] 94 % (10/11 0802) FiO2 (%):  [60 %-80 %] 60 % (10/11 0802) Weight:  [100.3 kg] 100.3 kg (10/11 0518) Last BM Date: 06/13/20  Intake/Output from previous day: 10/10 0701 - 10/11 0700 In: 381.7 [I.V.:100; IV Piggyback:281.7] Out: 2954 [Urine:2952; Stool:2] Intake/Output this shift: Total I/O In: -  Out: 450 [Urine:450]  General appearance: alert, cooperative and no distress GI: Soft with mild tenderness in the right upper quadrant to palpation.  No rigidity is noted.  Lab Results:  Recent Labs    06/15/20 0014 06/16/20 0712  WBC 14.8* 10.1  HGB 12.2 11.3*  HCT 38.0 35.0*  PLT 247 214   BMET Recent Labs    06/15/20 0014 06/16/20 0712  NA 138 137  K 2.8* 3.8  CL 88* 93*  CO2 36* 33*  GLUCOSE 232* 370*  BUN 13 22*  CREATININE 0.90 1.02*  CALCIUM 8.5* 8.3*   PT/INR Recent Labs    06/15/20 0014 06/16/20 0712  LABPROT 22.1* 21.7*  INR 2.0* 2.0*    Studies/Results: CT ABDOMEN PELVIS W CONTRAST  Result Date: 06/15/2020 CLINICAL DATA:  Acute abdominal pain, nonlocalized.  Cholelithiasis. EXAM: CT ABDOMEN AND PELVIS WITH CONTRAST TECHNIQUE: Multidetector CT imaging of the abdomen and pelvis was performed using the standard protocol following bolus administration of intravenous contrast. CONTRAST:  185mL OMNIPAQUE IOHEXOL 300 MG/ML  SOLN COMPARISON:  04/23/2019 FINDINGS: Lower chest: The visualized lung bases demonstrates interlobular septal thickening and ground-glass pulmonary infiltrate likely representing pulmonary edema, possibly cardiogenic in nature. This appears improved since prior CT  examination of 04/18/2020. Coronary artery stenting has been performed. Cardiac size is mildly enlarged. No pericardial effusion. Hepatobiliary: Cholelithiasis. No pericholecystic inflammatory change identified. Liver unremarkable. No intra or extrahepatic biliary ductal dilation. Pancreas: Unremarkable Spleen: Unremarkable Adrenals/Urinary Tract: Multiple bilateral adrenal nodules are unchanged measuring up to 2.5 cm within the left adrenal gland and compatible with multiple adenoma, better evaluated on prior examination. Kidneys are unremarkable. Bladder is unremarkable. Stomach/Bowel: Stomach, small bowel, and large bowel are unremarkable. Appendix normal. No free intraperitoneal gas or fluid. Vascular/Lymphatic: Extensive aortoiliac atherosclerotic calcification is present. No aortic aneurysm. No pathologic adenopathy within the abdomen and pelvis. Reproductive: Uterus and bilateral adnexa are unremarkable. Other: Rectum unremarkable Musculoskeletal: No acute bone abnormality. Remote compression fracture of T9 is unchanged. Superior endplate fracture of L3 is unchanged. IMPRESSION: Bibasilar pulmonary infiltrates and septal thickening in keeping with mild to moderate pulmonary edema, possibly cardiogenic in nature. This appears improved. Cholelithiasis without CT evidence of acute cholecystitis. Multiple stable adrenal adenoma. Biochemical correlation may be helpful to assess for hyperfunctioning. Aortic Atherosclerosis (ICD10-I70.0). Electronically Signed   By: Fidela Salisbury MD   On: 06/15/2020 04:03   DG Chest Port 1 View  Result Date: 06/15/2020 CLINICAL DATA:  Shortness of breath EXAM: PORTABLE CHEST 1 VIEW COMPARISON:  04/17/2020 FINDINGS: The tracheostomy tube terminates above the carina. The patient is status post prior median sternotomy. The heart size is enlarged. Coarse hazy bilateral airspace opacities are noted. These have improved since the prior study. There is architectural distortion and  areas of scarring, similar to prior study. There is blunting  of the costophrenic angles bilaterally similar to prior study. There is no acute osseous abnormality. IMPRESSION: Cardiomegaly with chronic bilateral airspace opacities. No definite acute cardiopulmonary process. Electronically Signed   By: Constance Holster M.D.   On: 06/15/2020 01:02   US Abdomen Limited RUQ  Result Date: 06/15/2020 CLINICAL DATA:  Epigastric abdomen pain since yesterday. EXAM: ULTRASOUND ABDOMEN LIMITED RIGHT UPPER QUADRANT COMPARISON:  CT abdomen and pelvis June 15, 2020 FINDINGS: Gallbladder: Gallstones are noted the gallbladder. There is no pericholecystic fluid. Gallbladder wall measures 2.7 mm. No sonographic Murphy sign noted by sonographer. Common bile duct: Diameter: 3.6 mm Liver: No focal lesion identified. Within normal limits in parenchymal echogenicity. Portal vein is patent on color Doppler imaging with normal direction of blood flow towards the liver. Other: None. IMPRESSION: Cholelithiasis without sonographic evidence of acute cholecystitis. Electronically Signed   By: Abelardo Diesel M.D.   On: 06/15/2020 11:36    Anti-infectives: Anti-infectives (From admission, onward)   Start     Dose/Rate Route Frequency Ordered Stop   06/15/20 1100  metroNIDAZOLE (FLAGYL) IVPB 500 mg        500 mg 100 mL/hr over 60 Minutes Intravenous Every 8 hours 06/15/20 0713 06/22/20 1059   06/15/20 1000  aztreonam (AZACTAM) 1 g in sodium chloride 0.9 % 100 mL IVPB  Status:  Discontinued        1 g 200 mL/hr over 30 Minutes Intravenous Every 8 hours 06/15/20 0723 06/15/20 0753   06/15/20 1000  ceFEPIme (MAXIPIME) 2 g in sodium chloride 0.9 % 100 mL IVPB        2 g 200 mL/hr over 30 Minutes Intravenous Every 8 hours 06/15/20 0754     06/15/20 0130  aztreonam (AZACTAM) 2 g in sodium chloride 0.9 % 100 mL IVPB        2 g 200 mL/hr over 30 Minutes Intravenous  Once 06/15/20 0127 06/15/20 0258   06/15/20 0130  metroNIDAZOLE  (FLAGYL) IVPB 500 mg        500 mg 100 mL/hr over 60 Minutes Intravenous  Once 06/15/20 0127 06/15/20 0358      Assessment/Plan: Impression: Patient had 2 blood cultures that were positive for E. coli.  Her liver enzyme tests have decreased, though her bilirubin has increased.  She also has evidence of heart failure with a beta natruretic peptide level of 920.  MRI results are pending.  Patient is not a surgical candidate at this time.  Suspect she has an element of cholangitis.  We will continue to monitor with you.  LOS: 1 day    Aviva Signs 06/16/2020

## 2020-06-16 NOTE — Progress Notes (Addendum)
PROGRESS NOTE  Theresa [name] EYC:[id number] DOB: 04/25/1966 DOA: 06/14/2020 PCP: Satira Sark, MD  Brief History:  54 year old female with systolic and diastolic CHF, COPD, continued tobacco abuse, diabetes mellitus type 2, lupus anticoagulant syndrome, coronary artery disease with history of STEMI, hypertension, chronic respiratory failure on 4 L, chronic tracheostomy presenting with 1 day history of abdominal pain with associated nausea and vomiting.  The patient began having nausea and nonbloody vomiting after eating a corn dog on 06/14/2020.  The patient denies any diarrhea, fevers, chills, headache.  She has had some shortness of breath over the last few days.  Unfortunately, she continues to smoke 1/2 pack/day.  She denies any hemoptysis.  She denies any worsening orthopnea or lower extremity edema. In the emergency department, the patient was febrile up to 100.7 F with soft blood pressures.  Systolic blood pressures were in the 90s to low 100s.  Oxygen saturation was 94% on 5 L.  CT of the abdomen and pelvis showed cholelithiasis without pericholecystic inflammation.  There was also interlobular septal thickening and groundglass opacities concerning for pulmonary edema.  The patient was started on cefepime and metronidazole.  General surgery was consulted to assist with management.  Assessment/Plan: Severe sepsis -due to Ecoli bacteremia -Concerned about cholecystitis -Present on admission -Presented with fever, low blood pressure, and leukocytosis -Lactic acid peaked 2.8 -Judicious IV fluids given the patient's CHF -Continue cefepime and metronidazole  Symptomatic cholelithiasis -Right upper quadrant ultrasound--Cholelithiasis without sonographic evidence of acute cholecystitis -06/15/2020 CT abdomen as discussed above -MRCP--no choledocholithiasis -General surgery consulted -not a good surgical candidate -GI consulted -LFTs trending down  COPD  exacerbation -Started Pulmicort -Started duo nebs -Started IV steroids  Acute on chronic respiratory failure with hypoxia -Secondary to COPD exacerbation in the setting of OHS/OSA -Chronically on 4 L nasal cannula -Personally reviewed chest x-ray--increased interstitial markings, small bilateral pleural effusions  Acute on Chronic systolic and diastolic CHF -1/49/7026 echo EF 40-45%, grade 3 DD -start IV lasix -Holding losartan secondary to soft blood pressure -Holding metoprolol succinate secondary to soft blood pressure -consult cardiology  Lupus anticoagulant syndrome -Holding Coumadin in anticipation for possible surgery -Start IV heparin bridge when INR is less than 2  Coronary artery disease -No chest pain presently  Uncontrolled diabetes mellitus type 2 with hyperglycemia -Increase Lantus to 25 units -NovoLog sliding scale -10/11-hemoglobin A1c--8.9 -holding Ozempic  Depression -Continue sertraline and home dose lorazepam  Hypokalemia -replete -check mag--1.7    Status is: Inpatient  Remains inpatient appropriate because:IV treatments appropriate due to intensity of illness or inability to take PO   Dispo: The patient is from: Home  Anticipated d/c is to: Home  Anticipated d/c date is: 3 days  Patient currently is not medically stable to d/c.        Family Communication: no  Family at bedside  Consultants:  General surgery  Code Status:  FULL   DVT Prophylaxis:  coumadin   Procedures: As Listed in Progress Note Above  Antibiotics: None      Subjective: Patient felling better today. Still having some sob.  Denies cp, n/v/d.  abd pain a little better.  Objective: Vitals:   06/16/20 0249 06/16/20 0518 06/16/20 0802 06/16/20 1703  BP: 138/69 93/61    Pulse: 90 93  92  Resp: 18 19  18   Temp: 98.4 F (36.9 C) 98.1 F (36.7 C)    TempSrc:  Oral    SpO2: 95%  94% 94% 96%   Weight:  100.3 kg    Height:        Intake/Output Summary (Last 24 hours) at 06/16/2020 1747 Last data filed at 06/16/2020 0900 Gross per 24 hour  Intake 281.72 ml  Output 2954 ml  Net -2672.28 ml   Weight change: 0.509 kg Exam:   General:  Pt is alert, follows commands appropriately, not in acute distress  HEENT: No icterus, No thrush, No neck mass, [place]/AT  Cardiovascular: RRR, S1/S2, no rubs, no gallops  Respiratory: bibasilar crackles  Abdomen: Soft/+BS, epigastric tender, non distended, no guarding  Extremities: 1+LE edema, No lymphangitis, No petechiae, No rashes, no synovitis   Data Reviewed: I have personally reviewed following labs and imaging studies Basic Metabolic Panel: Recent Labs  Lab 06/15/20 0014 06/16/20 0712  NA 138 137  K 2.8* 3.8  CL 88* 93*  CO2 36* 33*  GLUCOSE 232* 370*  BUN 13 22*  CREATININE 0.90 1.02*  CALCIUM 8.5* 8.3*  MG  --  1.7  PHOS  --  3.4   Liver Function Tests: Recent Labs  Lab 06/15/20 0014 06/16/20 0712  AST 485* 127*  ALT 163* 147*  ALKPHOS 232* 227*  BILITOT 3.0* 5.3*  PROT 6.9 6.9  ALBUMIN 3.0* 2.7*   Recent Labs  Lab 06/15/20 0014  LIPASE 25   No results for input(s): AMMONIA in the last 168 hours. Coagulation Profile: Recent Labs  Lab 06/15/20 0014 06/16/20 0712  INR 2.0* 2.0*   CBC: Recent Labs  Lab 06/15/20 0014 06/16/20 0712  WBC 14.8* 10.1  NEUTROABS 13.6*  --   HGB 12.2 11.3*  HCT 38.0 35.0*  MCV 93.1 95.1  PLT 247 214   Cardiac Enzymes: No results for input(s): CKTOTAL, CKMB, CKMBINDEX, TROPONINI in the last 168 hours. BNP: Invalid input(s): POCBNP CBG: Recent Labs  Lab 06/15/20 1734 06/15/20 2247 06/16/20 0758 06/16/20 1157 06/16/20 1641  GLUCAP 300* 350* 366* 386* 229*   HbA1C: Recent Labs    06/16/20 0712  HGBA1C 8.9*   Urine analysis:    Component Value Date/Time   COLORURINE AMBER (A) 06/15/2020 0119   APPEARANCEUR CLEAR 06/15/2020 0119   LABSPEC 1.014  06/15/2020 0119   PHURINE 6.0 06/15/2020 0119   GLUCOSEU NEGATIVE 06/15/2020 0119   HGBUR NEGATIVE 06/15/2020 0119   BILIRUBINUR SMALL (A) 06/15/2020 0119   KETONESUR NEGATIVE 06/15/2020 0119   PROTEINUR 30 (A) 06/15/2020 0119   NITRITE NEGATIVE 06/15/2020 0119   LEUKOCYTESUR NEGATIVE 06/15/2020 0119   Sepsis Labs: @LABRCNTIP (procalcitonin:4,lacticidven:4) ) Recent Results (from the past 240 hour(s))  Respiratory Panel by RT PCR (Flu A&B, Covid) - Nasopharyngeal Swab     Status: None   Collection Time: 06/15/20 12:31 AM   Specimen: Nasopharyngeal Swab  Result Value Ref Range Status   SARS Coronavirus 2 by RT PCR NEGATIVE NEGATIVE Final    Comment: (NOTE) SARS-CoV-2 target nucleic acids are NOT DETECTED.  The SARS-CoV-2 RNA is generally detectable in upper respiratoy specimens during the acute phase of infection. The lowest concentration of SARS-CoV-2 viral copies this assay can detect is 131 copies/mL. A negative result does not preclude SARS-Cov-2 infection and should not be used as the sole basis for treatment or other patient management decisions. A negative result may occur with  improper specimen collection/handling, submission of specimen other than nasopharyngeal swab, presence of viral mutation(s) within the areas targeted by this assay, and inadequate number of viral copies (<131 copies/mL). A negative result must be combined with clinical  observations, patient history, and epidemiological information. The expected result is Negative.  Fact Sheet for Patients:  PinkCheek.be  Fact Sheet for Healthcare Providers:  GravelBags.it  This test is no t yet approved or cleared by the [place] FDA and  has been authorized for detection and/or diagnosis of SARS-CoV-2 by FDA under an Emergency Use Authorization (EUA). This EUA will remain  in effect (meaning this test can be used) for the duration of the COVID-19  declaration under Section 564(b)(1) of the Act, 21 U.S.C. section 360bbb-3(b)(1), unless the authorization is terminated or revoked sooner.     Influenza A by PCR NEGATIVE NEGATIVE Final   Influenza B by PCR NEGATIVE NEGATIVE Final    Comment: (NOTE) The Xpert Xpress SARS-CoV-2/FLU/RSV assay is intended as an aid in  the diagnosis of influenza from Nasopharyngeal swab specimens and  should not be used as a sole basis for treatment. Nasal washings and  aspirates are unacceptable for Xpert Xpress SARS-CoV-2/FLU/RSV  testing.  Fact Sheet for Patients: PinkCheek.be  Fact Sheet for Healthcare Providers: GravelBags.it  This test is not yet approved or cleared by the [place] FDA and  has been authorized for detection and/or diagnosis of SARS-CoV-2 by  FDA under an Emergency Use Authorization (EUA). This EUA will remain  in effect (meaning this test can be used) for the duration of the  Covid-19 declaration under Section 564(b)(1) of the Act, 21  U.S.C. section 360bbb-3(b)(1), unless the authorization is  terminated or revoked. Performed at [facility], [address], [place], [place] [address]   Urine culture     Status: None   Collection Time: 06/15/20  1:19 AM   Specimen: In/Out Cath Urine  Result Value Ref Range Status   Specimen Description   Final    IN/OUT CATH URINE Performed at [facility], [address], [place], [place] [address]    Special Requests   Final    NONE Performed at [facility], [address], [place], [place] [address]    Culture   Final    NO GROWTH Performed at [facility], [place] [address], [place], [place] [address]    Report Status 06/16/2020 FINAL  Final  Blood culture (routine x 2)     Status: Abnormal (Preliminary result)   Collection Time: 06/15/20  1:38 AM   Specimen: BLOOD RIGHT HAND  Result Value Ref Range Status   Specimen Description   Final    BLOOD RIGHT  HAND Performed at [facility], [address], [place], [place] [address]    Special Requests   Final    BOTTLES DRAWN AEROBIC AND ANAEROBIC Blood Culture adequate volume Performed at [facility], [address], [place], [place] [address]    Culture  Setup Time   Final    GRAM NEGATIVE RODS ANAEROBIC BOTTLE ONLY Gram Stain Report Called to,Read Back By and Verified With: ANNE TUTTLE,RN 3664 10/10/2021KAY GRAM NEGATIVE RODS AEROBIC BOTTLE ONLY Gram Stain Report Called to,Read Back By and Verified With: ANNE TUTTLE,RN @1449  06/15/2020 KAY CRITICAL VALUE NOTED.  VALUE IS CONSISTENT WITH PREVIOUSLY REPORTED AND CALLED VALUE. Performed at [facility], [place] [address] [place], [place] [address]    Culture KLEBSIELLA PNEUMONIAE (A)  Final   Report Status PENDING  Incomplete  Blood Culture (routine x 2)     Status: Abnormal (Preliminary result)   Collection Time: 06/15/20  1:50 AM   Specimen: BLOOD LEFT ARM  Result Value Ref Range Status  Specimen Description   Final    BLOOD LEFT ARM Performed at [facility], [address], [place], [place] [address]    Special Requests   Final    BOTTLES DRAWN AEROBIC AND ANAEROBIC Blood Culture adequate volume Performed at [facility], [address] [place], [place] [address]    Culture  Setup Time   Final    GRAM NEGATIVE RODS ANAEROBIC BOTTLE ONLY Gram Stain Report Called to,Read Back By and Verified With: ANNE TUTTLE,RN  @1449  06/15/2020 KAY GRAM NEGATIVE RODS AEROBIC BOTTLE ONLY Gram Stain Report Called to,Read Back By and Verified With: ANNE TUTTLE,RN @1449  06/15/2020 KAY CRITICAL RESULT CALLED TO, READ BACK BY AND VERIFIED WITH: PHARMD K MEYER [id number] AT 1210 BY CM    Culture (A)  Final    KLEBSIELLA PNEUMONIAE SUSCEPTIBILITIES TO FOLLOW Performed at [facility], [place] [address], [place], [place] [address]    Report Status PENDING  Incomplete     Scheduled Meds: . budesonide (PULMICORT) nebulizer solution  0.5 mg  Nebulization BID  . influenza vac split quadrivalent PF  0.5 mL Intramuscular Tomorrow-1000  . insulin aspart  0-15 Units Subcutaneous TID WC  . insulin aspart  0-5 Units Subcutaneous QHS  . insulin glargine  10 Units Subcutaneous Daily  . ipratropium-albuterol  3 mL Nebulization Q6H  . methylPREDNISolone (SOLU-MEDROL) injection  60 mg Intravenous Q12H  . pantoprazole (PROTONIX) IV  40 mg Intravenous Q12H  . torsemide  40 mg Oral Daily   Continuous Infusions: . sodium chloride 500 mL (06/16/20 1451)  . ceFEPime (MAXIPIME) IV 2 g (06/16/20 1503)  . metronidazole 500 mg (06/16/20 1735)    Procedures/Studies: CT ABDOMEN PELVIS W CONTRAST  Result Date: 06/15/2020 CLINICAL DATA:  Acute abdominal pain, nonlocalized.  Cholelithiasis. EXAM: CT ABDOMEN AND PELVIS WITH CONTRAST TECHNIQUE: Multidetector CT imaging of the abdomen and pelvis was performed using the standard protocol following bolus administration of intravenous contrast. CONTRAST:  129mL OMNIPAQUE IOHEXOL 300 MG/ML  SOLN COMPARISON:  04/23/2019 FINDINGS: Lower chest: The visualized lung bases demonstrates interlobular septal thickening and ground-glass pulmonary infiltrate likely representing pulmonary edema, possibly cardiogenic in nature. This appears improved since prior CT examination of 04/18/2020. Coronary artery stenting has been performed. Cardiac size is mildly enlarged. No pericardial effusion. Hepatobiliary: Cholelithiasis. No pericholecystic inflammatory change identified. Liver unremarkable. No intra or extrahepatic biliary ductal dilation. Pancreas: Unremarkable Spleen: Unremarkable Adrenals/Urinary Tract: Multiple bilateral adrenal nodules are unchanged measuring up to 2.5 cm within the left adrenal gland and compatible with multiple adenoma, better evaluated on prior examination. Kidneys are unremarkable. Bladder is unremarkable. Stomach/Bowel: Stomach, small bowel, and large bowel are unremarkable. Appendix normal. No free  intraperitoneal gas or fluid. Vascular/Lymphatic: Extensive aortoiliac atherosclerotic calcification is present. No aortic aneurysm. No pathologic adenopathy within the abdomen and pelvis. Reproductive: Uterus and bilateral adnexa are unremarkable. Other: Rectum unremarkable Musculoskeletal: No acute bone abnormality. Remote compression fracture of T9 is unchanged. Superior endplate fracture of L3 is unchanged. IMPRESSION: Bibasilar pulmonary infiltrates and septal thickening in keeping with mild to moderate pulmonary edema, possibly cardiogenic in nature. This appears improved. Cholelithiasis without CT evidence of acute cholecystitis. Multiple stable adrenal adenoma. Biochemical correlation may be helpful to assess for hyperfunctioning. Aortic Atherosclerosis (ICD10-I70.0). Electronically Signed   By: Fidela Salisbury MD   On: 06/15/2020 04:03   MR 3D Recon At Scanner  Result Date: 06/16/2020 CLINICAL DATA:  Evaluate for gallstones.  Elevated bilirubin levels. EXAM: MRI ABDOMEN WITHOUT AND WITH CONTRAST (INCLUDING MRCP) TECHNIQUE:  Multiplanar multisequence MR imaging of the abdomen was performed both before and after the administration of intravenous contrast. Heavily T2-weighted images of the biliary and pancreatic ducts were obtained, and three-dimensional MRCP images were rendered by post processing. CONTRAST:  63mL GADAVIST GADOBUTROL 1 MMOL/ML IV SOLN COMPARISON:  Right upper quadrant sonogram 06/15/2020 and CT AP 06/15/2020. FINDINGS: Diminished exam detail due to respiratory motion artifact. Lower chest: No acute findings. Cardiac enlargement. There is reflux of contrast material from the right heart into the IVC and right hepatic veins seen on the arterial phase images suggesting passive venous congestion secondary to right heart failure. Hepatobiliary: No suspicious focal liver abnormality identified. Stones are identified within the gallbladder as seen on previous imaging. These measure up to 5 mm.  Upper limits of normal gallbladder wall thickening measuring up to 3 mm. No intrahepatic or common bile duct dilatation. No signs of choledocholithiasis. Pancreas: No mass, inflammatory changes, or other parenchymal abnormality identified. Spleen:  Within normal limits in size and appearance. Adrenals/Urinary Tract: Bilateral adrenal nodules are identified which exhibit loss of signal on out of phase sequences compatible with benign adenomas. No kidney mass or hydronephrosis identified. Stomach/Bowel: Visualized portions within the abdomen are unremarkable. Vascular/Lymphatic: No pathologically enlarged lymph nodes identified. Aortic atherosclerosis. No abdominal aortic aneurysm demonstrated. Other:  None. Musculoskeletal: No suspicious bone lesions identified. IMPRESSION: 1. Diminished exam detail due to respiratory motion artifact. 2. Gallstones as seen on previous imaging. Upper limits of normal gallbladder wall thickening. No intrahepatic or common bile duct dilatation. No signs of choledocholithiasis. 3. Bilateral adrenal adenomas. Electronically Signed   By: Kerby Moors M.D.   On: 06/16/2020 10:56   DG Chest Port 1 View  Result Date: 06/15/2020 CLINICAL DATA:  Shortness of breath EXAM: PORTABLE CHEST 1 VIEW COMPARISON:  04/17/2020 FINDINGS: The tracheostomy tube terminates above the carina. The patient is status post prior median sternotomy. The heart size is enlarged. Coarse hazy bilateral airspace opacities are noted. These have improved since the prior study. There is architectural distortion and areas of scarring, similar to prior study. There is blunting of the costophrenic angles bilaterally similar to prior study. There is no acute osseous abnormality. IMPRESSION: Cardiomegaly with chronic bilateral airspace opacities. No definite acute cardiopulmonary process. Electronically Signed   By: Constance Holster M.D.   On: 06/15/2020 01:02   MR ABDOMEN MRCP W WO CONTAST  Result Date:  06/16/2020 CLINICAL DATA:  Evaluate for gallstones.  Elevated bilirubin levels. EXAM: MRI ABDOMEN WITHOUT AND WITH CONTRAST (INCLUDING MRCP) TECHNIQUE: Multiplanar multisequence MR imaging of the abdomen was performed both before and after the administration of intravenous contrast. Heavily T2-weighted images of the biliary and pancreatic ducts were obtained, and three-dimensional MRCP images were rendered by post processing. CONTRAST:  23mL GADAVIST GADOBUTROL 1 MMOL/ML IV SOLN COMPARISON:  Right upper quadrant sonogram 06/15/2020 and CT AP 06/15/2020. FINDINGS: Diminished exam detail due to respiratory motion artifact. Lower chest: No acute findings. Cardiac enlargement. There is reflux of contrast material from the right heart into the IVC and right hepatic veins seen on the arterial phase images suggesting passive venous congestion secondary to right heart failure. Hepatobiliary: No suspicious focal liver abnormality identified. Stones are identified within the gallbladder as seen on previous imaging. These measure up to 5 mm. Upper limits of normal gallbladder wall thickening measuring up to 3 mm. No intrahepatic or common bile duct dilatation. No signs of choledocholithiasis. Pancreas: No mass, inflammatory changes, or other parenchymal abnormality identified. Spleen:  Within normal  limits in size and appearance. Adrenals/Urinary Tract: Bilateral adrenal nodules are identified which exhibit loss of signal on out of phase sequences compatible with benign adenomas. No kidney mass or hydronephrosis identified. Stomach/Bowel: Visualized portions within the abdomen are unremarkable. Vascular/Lymphatic: No pathologically enlarged lymph nodes identified. Aortic atherosclerosis. No abdominal aortic aneurysm demonstrated. Other:  None. Musculoskeletal: No suspicious bone lesions identified. IMPRESSION: 1. Diminished exam detail due to respiratory motion artifact. 2. Gallstones as seen on previous imaging. Upper limits  of normal gallbladder wall thickening. No intrahepatic or common bile duct dilatation. No signs of choledocholithiasis. 3. Bilateral adrenal adenomas. Electronically Signed   By: Kerby Moors M.D.   On: 06/16/2020 10:56   [place] EKG SITE RITE  Result Date: 06/16/2020 If Site Rite image not attached, placement could not be confirmed due to current cardiac rhythm.  US Abdomen Limited RUQ  Result Date: 06/15/2020 CLINICAL DATA:  Epigastric abdomen pain since yesterday. EXAM: ULTRASOUND ABDOMEN LIMITED RIGHT UPPER QUADRANT COMPARISON:  CT abdomen and pelvis June 15, 2020 FINDINGS: Gallbladder: Gallstones are noted the gallbladder. There is no pericholecystic fluid. Gallbladder wall measures 2.7 mm. No sonographic Murphy sign noted by sonographer. Common bile duct: Diameter: 3.6 mm Liver: No focal lesion identified. Within normal limits in parenchymal echogenicity. Portal vein is patent on color Doppler imaging with normal direction of blood flow towards the liver. Other: None. IMPRESSION: Cholelithiasis without sonographic evidence of acute cholecystitis. Electronically Signed   By: Abelardo Diesel M.D.   On: 06/15/2020 11:36    Orson Eva, DO  [facility]  If 7PM-7AM, please contact night-coverage [email or web address] Password [email or web address] 06/16/2020, 5:47 PM   LOS: 1 day

## 2020-06-16 NOTE — Progress Notes (Addendum)
Subjective: RUQ pain, epigastric pain is persistent and intermittent. Feels about same as when admitted. Sweating. No fever. Nausea but no vomiting. Does not know when she last took Coumadin; however, has had no coumadin since admission 10/9. States in 2018 saw a Psychologist, sport and exercise in [place] but unsure name. Was told not a candidate for cholecystectomy at that time.   Objective: Vital signs in last 24 hours: Temp:  [97.6 F (36.4 C)-98.4 F (36.9 C)] 98.1 F (36.7 C) (10/11 0518) Pulse Rate:  [80-93] 93 (10/11 0518) Resp:  [18-26] 19 (10/11 0518) BP: (93-138)/(56-83) 93/61 (10/11 0518) SpO2:  [85 %-96 %] 94 % (10/11 0802) FiO2 (%):  [60 %-80 %] 60 % (10/11 0802) Weight:  [100.3 kg] 100.3 kg (10/11 0518) Last BM Date: 06/13/20 General:   Alert and oriented, pleasant, erythematous cheeks. Trach in place Abdomen:  Bowel sounds present, soft, obese, TTP RUQ/RLQ, epigastric but without rebound or guarding. Extremities:  Without  edema. Neurologic:  Alert and  oriented x4 Skin:  Psoriatic rash bilateral upper and lower extremities Psych:  Alert and cooperative. Normal mood and affect.  Intake/Output from previous day: 10/10 0701 - 10/11 0700 In: 381.7 [I.V.:100; IV Piggyback:281.7] Out: 2954 [Urine:2952; Stool:2] Intake/Output this shift: No intake/output data recorded.  Lab Results: Recent Labs    06/15/20 0014 06/16/20 0712  WBC 14.8* 10.1  HGB 12.2 11.3*  HCT 38.0 35.0*  PLT 247 214   BMET Recent Labs    06/15/20 0014 06/16/20 0712  NA 138 137  K 2.8* 3.8  CL 88* 93*  CO2 36* 33*  GLUCOSE 232* 370*  BUN 13 22*  CREATININE 0.90 1.02*  CALCIUM 8.5* 8.3*   LFT Recent Labs    06/15/20 0014 06/16/20 0712  PROT 6.9 6.9  ALBUMIN 3.0* 2.7*  AST 485* 127*  ALT 163* 147*  ALKPHOS 232* 227*  BILITOT 3.0* 5.3*   PT/INR Recent Labs    06/15/20 0014 06/16/20 0712  LABPROT 22.1* 21.7*  INR 2.0* 2.0*     Studies/Results: CT ABDOMEN PELVIS W CONTRAST  Result  Date: 06/15/2020 CLINICAL DATA:  Acute abdominal pain, nonlocalized.  Cholelithiasis. EXAM: CT ABDOMEN AND PELVIS WITH CONTRAST TECHNIQUE: Multidetector CT imaging of the abdomen and pelvis was performed using the standard protocol following bolus administration of intravenous contrast. CONTRAST:  165m OMNIPAQUE IOHEXOL 300 MG/ML  SOLN COMPARISON:  04/23/2019 FINDINGS: Lower chest: The visualized lung bases demonstrates interlobular septal thickening and ground-glass pulmonary infiltrate likely representing pulmonary edema, possibly cardiogenic in nature. This appears improved since prior CT examination of 04/18/2020. Coronary artery stenting has been performed. Cardiac size is mildly enlarged. No pericardial effusion. Hepatobiliary: Cholelithiasis. No pericholecystic inflammatory change identified. Liver unremarkable. No intra or extrahepatic biliary ductal dilation. Pancreas: Unremarkable Spleen: Unremarkable Adrenals/Urinary Tract: Multiple bilateral adrenal nodules are unchanged measuring up to 2.5 cm within the left adrenal gland and compatible with multiple adenoma, better evaluated on prior examination. Kidneys are unremarkable. Bladder is unremarkable. Stomach/Bowel: Stomach, small bowel, and large bowel are unremarkable. Appendix normal. No free intraperitoneal gas or fluid. Vascular/Lymphatic: Extensive aortoiliac atherosclerotic calcification is present. No aortic aneurysm. No pathologic adenopathy within the abdomen and pelvis. Reproductive: Uterus and bilateral adnexa are unremarkable. Other: Rectum unremarkable Musculoskeletal: No acute bone abnormality. Remote compression fracture of T9 is unchanged. Superior endplate fracture of L3 is unchanged. IMPRESSION: Bibasilar pulmonary infiltrates and septal thickening in keeping with mild to moderate pulmonary edema, possibly cardiogenic in nature. This appears improved. Cholelithiasis without CT evidence of  acute cholecystitis. Multiple stable adrenal  adenoma. Biochemical correlation may be helpful to assess for hyperfunctioning. Aortic Atherosclerosis (ICD10-I70.0). Electronically Signed   By: Fidela Salisbury MD   On: 06/15/2020 04:03   DG Chest Port 1 View  Result Date: 06/15/2020 CLINICAL DATA:  Shortness of breath EXAM: PORTABLE CHEST 1 VIEW COMPARISON:  04/17/2020 FINDINGS: The tracheostomy tube terminates above the carina. The patient is status post prior median sternotomy. The heart size is enlarged. Coarse hazy bilateral airspace opacities are noted. These have improved since the prior study. There is architectural distortion and areas of scarring, similar to prior study. There is blunting of the costophrenic angles bilaterally similar to prior study. There is no acute osseous abnormality. IMPRESSION: Cardiomegaly with chronic bilateral airspace opacities. No definite acute cardiopulmonary process. Electronically Signed   By: Constance Holster M.D.   On: 06/15/2020 01:02   US Abdomen Limited RUQ  Result Date: 06/15/2020 CLINICAL DATA:  Epigastric abdomen pain since yesterday. EXAM: ULTRASOUND ABDOMEN LIMITED RIGHT UPPER QUADRANT COMPARISON:  CT abdomen and pelvis June 15, 2020 FINDINGS: Gallbladder: Gallstones are noted the gallbladder. There is no pericholecystic fluid. Gallbladder wall measures 2.7 mm. No sonographic Murphy sign noted by sonographer. Common bile duct: Diameter: 3.6 mm Liver: No focal lesion identified. Within normal limits in parenchymal echogenicity. Portal vein is patent on color Doppler imaging with normal direction of blood flow towards the liver. Other: None. IMPRESSION: Cholelithiasis without sonographic evidence of acute cholecystitis. Electronically Signed   By: Abelardo Diesel M.D.   On: 06/15/2020 11:36    Assessment: 54 year old female with multiple medical issues (CHF, DVTs on Coumadin, COPD, diabetes, lupus anticoagulant syndrome, CAD, history of STEMI and CABG, chronic respiratory failure, chronic trach  since 2002) admitted with sepsis with concerns for cholangitis. Blood cultures with gram negative rods thus far.   US abdomen with cholelithiasis, CBD 3.6 mm. Acutely elevated LFTs with bilirubin 3, AST 485, ALT 163, Alk Phos 232 on admission. Improvement in transaminases today and alk phos, with increase in Tbili to 5.3. MRI/MRCP has been ordered for today but not completed yet. Clinically, she notes persistent pain but without worsening of symptoms since admission.  Previously evaluated in [place] for cholecystectomy due to symptomatic gallstones in 2018 but was told she was a high risk surgical candidate due to active cardiac disease at that time per patient. I can't find record of this and suspect may have been outside of [facility]?).   Coumadin has been on hold since admission (10/9). Pharmacy following with plans to start heparin drip when INR less than 2.    Surgery following; patient remains a poor surgical candidate.   Plan: Await MRI/MRCP Follow LFTs Continue IV antibiotics Coumadin on hold; appreciate pharmacy consultation and following PPI BID Clear liquids   Annitta Needs, PhD, ANP-BC [facility]     LOS: 1 day    06/16/2020, 9:28 AM

## 2020-06-16 NOTE — Progress Notes (Signed)
Patient on her way for MRI procedure at this time.

## 2020-06-16 NOTE — Progress Notes (Signed)
Inpatient Diabetes Program Recommendations  AACE/ADA: New Consensus Statement on Inpatient Glycemic Control   Target Ranges:  Prepandial:   less than 140 mg/dL      Peak postprandial:   less than 180 mg/dL (1-2 hours)      Critically ill patients:  140 - 180 mg/dL   Results for Theresa [name], Theresa [name] (MRN [id number]) as of 06/16/2020 11:44  Ref. Range 06/15/2020 07:41 06/15/2020 13:25 06/15/2020 17:34 06/15/2020 22:47 06/16/2020 07:58  Glucose-Capillary Latest Ref Range: 70 - 99 mg/dL 190 (H) 202 (H) 300 (H) 350 (H) 366 (H)   Review of Glycemic Control  Diabetes history: DM2 Outpatient Diabetes medications: Tresiba 50 units QHS, Novolog 1-10 units TID with meals, Ozempic 0.5 mg Qweek Current orders for Inpatient glycemic control: Lantus 10 units daily, Novolog 0-15 units TID with meals, Novolog 0-5 units QHS; Solumedrol 60 mg Q12H  Inpatient Diabetes Program Recommendations:    Insulin: If steroids are continued, please consider increasing Lantus to 30 units daily (also order one time Lantus 20 units x1 now for total of 30 units today).  HbgA1C: Please consider ordering an A1C to evaluate glycemic control over the past 2-3 months.  Thanks, Barnie Alderman, RN, MSN, CDE Diabetes Coordinator Inpatient Diabetes Program [phone] (Team Pager from 8am to 5pm)

## 2020-06-16 NOTE — Progress Notes (Signed)
PHARMACY - PHYSICIAN COMMUNICATION CRITICAL VALUE ALERT - BLOOD CULTURE IDENTIFICATION (BCID)  [name] is an 54 y.o. female who presented to [facility] on 06/14/2020 with a chief complaint of  RUQ pain.  Assessment:  BCID  = K. pneumoniae, both bottles-->sensitivities pending  Name of physician (or Provider) Contacted: Dr. Carles Collet  Current antibiotics: cefepime 2g IV q8h  Changes to prescribed antibiotics recommended:  Ceftriaxone 2g IV q24h  No results found for this or any previous visit.  [name] 06/16/2020  12:11 PM

## 2020-06-17 DIAGNOSIS — D6862 Lupus anticoagulant syndrome: Secondary | ICD-10-CM

## 2020-06-17 DIAGNOSIS — I5043 Acute on chronic combined systolic (congestive) and diastolic (congestive) heart failure: Secondary | ICD-10-CM | POA: Diagnosis not present

## 2020-06-17 DIAGNOSIS — I2510 Atherosclerotic heart disease of native coronary artery without angina pectoris: Secondary | ICD-10-CM

## 2020-06-17 DIAGNOSIS — Z794 Long term (current) use of insulin: Secondary | ICD-10-CM

## 2020-06-17 DIAGNOSIS — J9611 Chronic respiratory failure with hypoxia: Secondary | ICD-10-CM | POA: Diagnosis not present

## 2020-06-17 DIAGNOSIS — A4151 Sepsis due to Escherichia coli [E. coli]: Principal | ICD-10-CM

## 2020-06-17 DIAGNOSIS — R1084 Generalized abdominal pain: Secondary | ICD-10-CM

## 2020-06-17 LAB — COMPREHENSIVE METABOLIC PANEL
ALT: 127 U/L — ABNORMAL HIGH (ref 0–44)
AST: 67 U/L — ABNORMAL HIGH (ref 15–41)
Albumin: 3 g/dL — ABNORMAL LOW (ref 3.5–5.0)
Alkaline Phosphatase: 214 U/L — ABNORMAL HIGH (ref 38–126)
Anion gap: 17 — ABNORMAL HIGH (ref 5–15)
BUN: 25 mg/dL — ABNORMAL HIGH (ref 6–20)
CO2: 34 mmol/L — ABNORMAL HIGH (ref 22–32)
Calcium: 8.6 mg/dL — ABNORMAL LOW (ref 8.9–10.3)
Chloride: 90 mmol/L — ABNORMAL LOW (ref 98–111)
Creatinine, Ser: 1.07 mg/dL — ABNORMAL HIGH (ref 0.44–1.00)
GFR, Estimated: 59 mL/min — ABNORMAL LOW (ref >60)
Glucose, Bld: 192 mg/dL — ABNORMAL HIGH (ref 70–99)
Potassium: 3.3 mmol/L — ABNORMAL LOW (ref 3.5–5.1)
Sodium: 141 mmol/L (ref 135–145)
Total Bilirubin: 2.4 mg/dL — ABNORMAL HIGH (ref 0.3–1.2)
Total Protein: 7.2 g/dL (ref 6.5–8.1)

## 2020-06-17 LAB — PROTIME-INR
INR: 1.5 — ABNORMAL HIGH (ref 0.8–1.2)
Prothrombin Time: 17.4 seconds — ABNORMAL HIGH (ref 11.4–15.2)

## 2020-06-17 LAB — CBC
HCT: 38 % (ref 36.0–46.0)
Hemoglobin: 12 g/dL (ref 12.0–15.0)
MCH: 30.1 pg (ref 26.0–34.0)
MCHC: 31.6 g/dL (ref 30.0–36.0)
MCV: 95.2 fL (ref 80.0–100.0)
Platelets: 291 10*3/uL (ref 150–400)
RBC: 3.99 MIL/uL (ref 3.87–5.11)
RDW: 15.9 % — ABNORMAL HIGH (ref 11.5–15.5)
WBC: 15.9 10*3/uL — ABNORMAL HIGH (ref 4.0–10.5)
nRBC: 0 % (ref 0.0–0.2)

## 2020-06-17 LAB — CULTURE, BLOOD (ROUTINE X 2)
Special Requests: ADEQUATE
Special Requests: ADEQUATE

## 2020-06-17 LAB — HEPARIN LEVEL (UNFRACTIONATED): Heparin Unfractionated: <0.1 IU/mL — ABNORMAL LOW (ref 0.30–0.70)

## 2020-06-17 LAB — GLUCOSE, CAPILLARY
Glucose-Capillary: 188 mg/dL — ABNORMAL HIGH (ref 70–99)
Glucose-Capillary: 297 mg/dL — ABNORMAL HIGH (ref 70–99)
Glucose-Capillary: 273 mg/dL — ABNORMAL HIGH (ref 70–99)
Glucose-Capillary: 219 mg/dL — ABNORMAL HIGH (ref 70–99)

## 2020-06-17 MED ORDER — POLYETHYLENE GLYCOL 3350 17 G PO PACK
17.0000 g | PACK | Freq: Every day | ORAL | Status: DC
  Administered 2020-06-17 – 2020-06-18 (×2): 17 g via ORAL
  Filled 2020-06-17 (×4): qty 1

## 2020-06-17 MED ORDER — PREDNISONE 20 MG PO TABS
50.0000 mg | ORAL_TABLET | Freq: Every day | ORAL | Status: DC
  Administered 2020-06-18 – 2020-06-19 (×2): 50 mg via ORAL
  Filled 2020-06-17 (×2): qty 1

## 2020-06-17 MED ORDER — WARFARIN - PHARMACIST DOSING INPATIENT: Freq: Every day | Status: DC

## 2020-06-17 MED ORDER — METOPROLOL SUCCINATE ER 25 MG PO TB24
25.0000 mg | ORAL_TABLET | Freq: Every day | ORAL | Status: DC
  Administered 2020-06-18 – 2020-06-20 (×3): 25 mg via ORAL
  Filled 2020-06-17 (×3): qty 1

## 2020-06-17 MED ORDER — WARFARIN SODIUM 5 MG PO TABS
5.0000 mg | ORAL_TABLET | Freq: Once | ORAL | Status: AC
  Administered 2020-06-17: 5 mg via ORAL
  Filled 2020-06-17: qty 1

## 2020-06-17 MED ORDER — SODIUM CHLORIDE 0.9 % IV SOLN
2.0000 g | INTRAVENOUS | Status: DC
  Administered 2020-06-17 – 2020-06-19 (×3): 2 g via INTRAVENOUS
  Filled 2020-06-17 (×6): qty 20

## 2020-06-17 MED ORDER — IPRATROPIUM-ALBUTEROL 0.5-2.5 (3) MG/3ML IN SOLN
3.0000 mL | Freq: Three times a day (TID) | RESPIRATORY_TRACT | Status: DC
  Administered 2020-06-18 – 2020-06-19 (×4): 3 mL via RESPIRATORY_TRACT
  Filled 2020-06-17 (×4): qty 3

## 2020-06-17 NOTE — Progress Notes (Signed)
Pt stated that she felt a little more SOB, like she needed her nasal cannula. Pt currently on trach collar at 40% O2 (10lpm). SaO2 88-89%. RT notified, stated pt was on 60% O2 (12lpm) this am and that RT was attempting to wean pt down. RT instructs to increase O2 to 60% (12 lpm) and she will be in to assess. Pt and husband updated on SaO2, rationale for trach collar and increase is O2. Both state understanding.

## 2020-06-17 NOTE — Progress Notes (Signed)
Subjective: Patient feels better with less abdominal pain.  Is hungry.  Objective: Vital signs in last 24 hours: Temp:  [97.9 F (36.6 C)-98.2 F (36.8 C)] 97.9 F (36.6 C) (10/12 0550) Pulse Rate:  [85-92] 85 (10/12 0550) Resp:  [18] 18 (10/12 0550) BP: (127-134)/(76-88) 127/76 (10/12 0550) SpO2:  [91 %-98 %] 98 % (10/12 0732) FiO2 (%):  [40 %-60 %] 40 % (10/12 0732) Last BM Date: 06/16/20  Intake/Output from previous day: 10/11 0701 - 10/12 0700 In: 6295 [P.O.:1160; I.V.:82.2; IV Piggyback:500.9] Out: 1250 [Urine:1250] Intake/Output this shift: No intake/output data recorded.  General appearance: alert, cooperative and no distress GI: soft, non-tender; bowel sounds normal; no masses,  no organomegaly  Lab Results:  Recent Labs    06/16/20 0712 06/17/20 0556  WBC 10.1 15.9*  HGB 11.3* 12.0  HCT 35.0* 38.0  PLT 214 291   BMET Recent Labs    06/16/20 0712 06/17/20 0556  NA 137 141  K 3.8 3.3*  CL 93* 90*  CO2 33* 34*  GLUCOSE 370* 192*  BUN 22* 25*  CREATININE 1.02* 1.07*  CALCIUM 8.3* 8.6*   PT/INR Recent Labs    06/15/20 0014 06/16/20 0712  LABPROT 22.1* 21.7*  INR 2.0* 2.0*    Studies/Results: MR 3D Recon At Scanner  Result Date: 06/16/2020 CLINICAL DATA:  Evaluate for gallstones.  Elevated bilirubin levels. EXAM: MRI ABDOMEN WITHOUT AND WITH CONTRAST (INCLUDING MRCP) TECHNIQUE: Multiplanar multisequence MR imaging of the abdomen was performed both before and after the administration of intravenous contrast. Heavily T2-weighted images of the biliary and pancreatic ducts were obtained, and three-dimensional MRCP images were rendered by post processing. CONTRAST:  8mL GADAVIST GADOBUTROL 1 MMOL/ML IV SOLN COMPARISON:  Right upper quadrant sonogram 06/15/2020 and CT AP 06/15/2020. FINDINGS: Diminished exam detail due to respiratory motion artifact. Lower chest: No acute findings. Cardiac enlargement. There is reflux of contrast material from the  right heart into the IVC and right hepatic veins seen on the arterial phase images suggesting passive venous congestion secondary to right heart failure. Hepatobiliary: No suspicious focal liver abnormality identified. Stones are identified within the gallbladder as seen on previous imaging. These measure up to 5 mm. Upper limits of normal gallbladder wall thickening measuring up to 3 mm. No intrahepatic or common bile duct dilatation. No signs of choledocholithiasis. Pancreas: No mass, inflammatory changes, or other parenchymal abnormality identified. Spleen:  Within normal limits in size and appearance. Adrenals/Urinary Tract: Bilateral adrenal nodules are identified which exhibit loss of signal on out of phase sequences compatible with benign adenomas. No kidney mass or hydronephrosis identified. Stomach/Bowel: Visualized portions within the abdomen are unremarkable. Vascular/Lymphatic: No pathologically enlarged lymph nodes identified. Aortic atherosclerosis. No abdominal aortic aneurysm demonstrated. Other:  None. Musculoskeletal: No suspicious bone lesions identified. IMPRESSION: 1. Diminished exam detail due to respiratory motion artifact. 2. Gallstones as seen on previous imaging. Upper limits of normal gallbladder wall thickening. No intrahepatic or common bile duct dilatation. No signs of choledocholithiasis. 3. Bilateral adrenal adenomas. Electronically Signed   By: Kerby Moors M.D.   On: 06/16/2020 10:56   MR ABDOMEN MRCP W WO CONTAST  Result Date: 06/16/2020 CLINICAL DATA:  Evaluate for gallstones.  Elevated bilirubin levels. EXAM: MRI ABDOMEN WITHOUT AND WITH CONTRAST (INCLUDING MRCP) TECHNIQUE: Multiplanar multisequence MR imaging of the abdomen was performed both before and after the administration of intravenous contrast. Heavily T2-weighted images of the biliary and pancreatic ducts were obtained, and three-dimensional MRCP images were rendered by post processing.  CONTRAST:  25mL GADAVIST  GADOBUTROL 1 MMOL/ML IV SOLN COMPARISON:  Right upper quadrant sonogram 06/15/2020 and CT AP 06/15/2020. FINDINGS: Diminished exam detail due to respiratory motion artifact. Lower chest: No acute findings. Cardiac enlargement. There is reflux of contrast material from the right heart into the IVC and right hepatic veins seen on the arterial phase images suggesting passive venous congestion secondary to right heart failure. Hepatobiliary: No suspicious focal liver abnormality identified. Stones are identified within the gallbladder as seen on previous imaging. These measure up to 5 mm. Upper limits of normal gallbladder wall thickening measuring up to 3 mm. No intrahepatic or common bile duct dilatation. No signs of choledocholithiasis. Pancreas: No mass, inflammatory changes, or other parenchymal abnormality identified. Spleen:  Within normal limits in size and appearance. Adrenals/Urinary Tract: Bilateral adrenal nodules are identified which exhibit loss of signal on out of phase sequences compatible with benign adenomas. No kidney mass or hydronephrosis identified. Stomach/Bowel: Visualized portions within the abdomen are unremarkable. Vascular/Lymphatic: No pathologically enlarged lymph nodes identified. Aortic atherosclerosis. No abdominal aortic aneurysm demonstrated. Other:  None. Musculoskeletal: No suspicious bone lesions identified. IMPRESSION: 1. Diminished exam detail due to respiratory motion artifact. 2. Gallstones as seen on previous imaging. Upper limits of normal gallbladder wall thickening. No intrahepatic or common bile duct dilatation. No signs of choledocholithiasis. 3. Bilateral adrenal adenomas. Electronically Signed   By: Kerby Moors M.D.   On: 06/16/2020 10:56   [place] EKG SITE RITE  Result Date: 06/16/2020 If Site Rite image not attached, placement could not be confirmed due to current cardiac rhythm.  US Abdomen Limited RUQ  Result Date: 06/15/2020 CLINICAL DATA:  Epigastric  abdomen pain since yesterday. EXAM: ULTRASOUND ABDOMEN LIMITED RIGHT UPPER QUADRANT COMPARISON:  CT abdomen and pelvis June 15, 2020 FINDINGS: Gallbladder: Gallstones are noted the gallbladder. There is no pericholecystic fluid. Gallbladder wall measures 2.7 mm. No sonographic Murphy sign noted by sonographer. Common bile duct: Diameter: 3.6 mm Liver: No focal lesion identified. Within normal limits in parenchymal echogenicity. Portal vein is patent on color Doppler imaging with normal direction of blood flow towards the liver. Other: None. IMPRESSION: Cholelithiasis without sonographic evidence of acute cholecystitis. Electronically Signed   By: Abelardo Diesel M.D.   On: 06/15/2020 11:36    Anti-infectives: Anti-infectives (From admission, onward)   Start     Dose/Rate Route Frequency Ordered Stop   06/17/20 0900  cefTRIAXone (ROCEPHIN) 2 g in sodium chloride 0.9 % 100 mL IVPB        2 g 200 mL/hr over 30 Minutes Intravenous Every 24 hours 06/17/20 0819     06/15/20 1100  metroNIDAZOLE (FLAGYL) IVPB 500 mg        500 mg 100 mL/hr over 60 Minutes Intravenous Every 8 hours 06/15/20 0713 06/22/20 0559   06/15/20 1000  aztreonam (AZACTAM) 1 g in sodium chloride 0.9 % 100 mL IVPB  Status:  Discontinued        1 g 200 mL/hr over 30 Minutes Intravenous Every 8 hours 06/15/20 0723 06/15/20 0753   06/15/20 1000  ceFEPIme (MAXIPIME) 2 g in sodium chloride 0.9 % 100 mL IVPB  Status:  Discontinued        2 g 200 mL/hr over 30 Minutes Intravenous Every 8 hours 06/15/20 0754 06/17/20 0819   06/15/20 0130  aztreonam (AZACTAM) 2 g in sodium chloride 0.9 % 100 mL IVPB        2 g 200 mL/hr over 30 Minutes Intravenous  Once 06/15/20  0127 06/15/20 0258   06/15/20 0130  metroNIDAZOLE (FLAGYL) IVPB 500 mg        500 mg 100 mL/hr over 60 Minutes Intravenous  Once 06/15/20 0127 06/15/20 0358      Assessment/Plan: Impression: Cholelithiasis, Thickened gallbladder wall, hyperbilirubinemia which is resolving.   MRCP shows no choledocholithiasis.  She did have 2+ blood cultures.  LFTs are improving. No need for cholecystostomy tube placement at this point.  Patient is a poor surgical candidate and apparently has been told that in the past by surgery in [place].  As she is improving, I am advancing her to a full liquid diet.  If she continues to improve, patient may able to complete her antibiotic therapy at home.  LOS: 2 days    Aviva Signs 06/17/2020

## 2020-06-17 NOTE — Progress Notes (Signed)
PROGRESS NOTE  Theresa [name] ZSW:[id number] DOB: Aug 11, 1966 DOA: 06/14/2020 PCP: No primary care provider on file.   Brief History:  54 year old female with systolic and diastolic CHF, COPD, continued tobacco abuse, diabetes mellitus type 2, lupus anticoagulant syndrome, coronary artery disease with history of STEMI, hypertension, chronic respiratory failure on 4 L, chronic tracheostomy presenting with 1 day history of abdominal pain with associated nausea and vomiting. The patient began having nausea and nonbloody vomiting after eating a corn dog on 06/14/2020. The patient denies any diarrhea, fevers, chills, headache. She has had some shortness of breath over the last few days. Unfortunately, she continues to smoke 1/2 pack/day. She denies any hemoptysis. She denies any worsening orthopnea or lower extremity edema. In the emergency department, the patient was febrile up to 100.7 F with soft blood pressures. Systolic blood pressures were in the 90s to low 100s. Oxygen saturation was 94% on 5 L. CT of the abdomen and pelvis showed cholelithiasis without pericholecystic inflammation. There was also interlobular septal thickening and groundglass opacities concerning for pulmonary edema. The patient was started on cefepime and metronidazole. General surgery was consulted to assist with management.  Assessment/Plan: Severe sepsis -due to Ecoli bacteremia -Concerned about cholecystitis -Present on admission -Presented with fever, low blood pressure, and leukocytosis -Lactic acid peaked 2.8 -Judicious IV fluids initially-->stopped -d/c cefepime -Continue cetriaxone and metronidazole  Symptomatic cholelithiasis -Right upper quadrant ultrasound--Cholelithiasis without sonographic evidence of acute cholecystitis -06/15/2020 CT abdomen as discussed above -MRCP--no choledocholithiasis -General surgery consulted-->continue nonoperative management--discussed with Dr.  Arnoldo Morale -not a good surgical candidate -GI consult appreciated -LFTs trending down  COPD exacerbation -Started Pulmicort -Started duo nebs -Started IV steroids>>po steroids  Acute on chronic respiratory failure with hypoxia -Secondary to COPD exacerbation in the setting of OHS/OSA -10L>>>5L -Chronically on 4 L nasal cannula -Personally reviewed chest x-ray--increased interstitial markings, small bilateral pleural effusions  Acute on Chronic systolic and diastolic CHF -11/25/252 echo EF 40-45%, grade 3 DD -continue IV lasix 60 mg bid -Holding losartan secondary to soft blood pressure -Holding metoprolol succinate secondary to soft blood pressure initially-->restart at lower dose -consult cardiology--appreciated  Lupus anticoagulant syndrome -Holding Coumadin in anticipation for possible surgery -restart coumadin as there are no plans for surgery  Coronary artery disease -No chest pain presently  Uncontrolled diabetes mellitus type 2 with hyperglycemia -Increase Lantus to 25 units -NovoLog sliding scale -10/11-hemoglobin A1c--8.9 -holding Ozempic  Depression -Continue sertraline and home dose lorazepam  Hypokalemia -replete -check mag--1.7    Status is: Inpatient  Remains inpatient appropriate because:IV treatments appropriate due to intensity of illness or inability to take PO   Dispo: The patient is from:Home Anticipated d/c is [facility] Anticipated d/c date is: 2 days Patient currently is not medically stable to d/c.        Family Communication:noFamily at bedside  Consultants:General surgery  Code Status: FULL   DVT Prophylaxis:coumadin   Procedures: As Listed in Progress Note Above  Antibiotics: Cefepime 10/10>>10/12 Ceftriaxone 10/12>>     Subjective: Patient  States abd pain is 45% better.  Denies f/c, cp, n/v/d.  Sob is improving but still has orthopnea.   Denies dysuria, hematochezia, diarrhea, melena  Objective: Vitals:   06/17/20 1100 06/17/20 1416 06/17/20 1432 06/17/20 1433  BP:   136/77 136/77  Pulse:   89 89  Resp:   20 20  Temp:   98 F (36.7 C) 98 F (36.7 C)  TempSrc:   Oral Oral  SpO2: 96% 95%  94%  Weight:      Height:        Intake/Output Summary (Last 24 hours) at 06/17/2020 1607 Last data filed at 06/17/2020 1134 Gross per 24 hour  Intake 1063.01 ml  Output 600 ml  Net 463.01 ml   Weight change:  Exam:   General:  Pt is alert, follows commands appropriately, not in acute distress  HEENT: No icterus, No thrush, No neck mass, /AT  Cardiovascular: RRR, S1/S2, no rubs, no gallops  Respiratory: bibasilar crackles. No wheeze  Abdomen: Soft/+BS, upper abd tender, non distended, no guarding  Extremities: 1+ LE edema, No lymphangitis, No petechiae, No rashes, no synovitis   Data Reviewed: I have personally reviewed following labs and imaging studies Basic Metabolic Panel: Recent Labs  Lab 06/15/20 0014 06/16/20 0712 06/17/20 0556  NA 138 137 141  K 2.8* 3.8 3.3*  CL 88* 93* 90*  CO2 36* 33* 34*  GLUCOSE 232* 370* 192*  BUN 13 22* 25*  CREATININE 0.90 1.02* 1.07*  CALCIUM 8.5* 8.3* 8.6*  MG  --  1.7  --   PHOS  --  3.4  --    Liver Function Tests: Recent Labs  Lab 06/15/20 0014 06/16/20 0712 06/17/20 0556  AST 485* 127* 67*  ALT 163* 147* 127*  ALKPHOS 232* 227* 214*  BILITOT 3.0* 5.3* 2.4*  PROT 6.9 6.9 7.2  ALBUMIN 3.0* 2.7* 3.0*   Recent Labs  Lab 06/15/20 0014  LIPASE 25   No results for input(s): AMMONIA in the last 168 hours. Coagulation Profile: Recent Labs  Lab 06/15/20 0014 06/16/20 0712 06/17/20 0556  INR 2.0* 2.0* 1.5*   CBC: Recent Labs  Lab 06/15/20 0014 06/16/20 0712 06/17/20 0556  WBC 14.8* 10.1 15.9*  NEUTROABS 13.6*  --   --   HGB 12.2 11.3* 12.0  HCT 38.0 35.0* 38.0  MCV 93.1 95.1 95.2  PLT 247 214 291   Cardiac Enzymes: No results for  input(s): CKTOTAL, CKMB, CKMBINDEX, TROPONINI in the last 168 hours. BNP: Invalid input(s): POCBNP CBG: Recent Labs  Lab 06/16/20 1157 06/16/20 1641 06/16/20 2151 06/17/20 0754 06/17/20 1132  GLUCAP 386* 229* 210* 188* 297*   HbA1C: Recent Labs    06/16/20 0712  HGBA1C 8.9*   Urine analysis:    Component Value Date/Time   COLORURINE AMBER (A) 06/15/2020 0119   APPEARANCEUR CLEAR 06/15/2020 0119   LABSPEC 1.014 06/15/2020 0119   PHURINE 6.0 06/15/2020 0119   GLUCOSEU NEGATIVE 06/15/2020 0119   HGBUR NEGATIVE 06/15/2020 0119   BILIRUBINUR SMALL (A) 06/15/2020 0119   KETONESUR NEGATIVE 06/15/2020 0119   PROTEINUR 30 (A) 06/15/2020 0119   NITRITE NEGATIVE 06/15/2020 0119   LEUKOCYTESUR NEGATIVE 06/15/2020 0119   Sepsis Labs: @LABRCNTIP (procalcitonin:4,lacticidven:4) ) Recent Results (from the past 240 hour(s))  Respiratory Panel by RT PCR (Flu A&B, Covid) - Nasopharyngeal Swab     Status: None   Collection Time: 06/15/20 12:31 AM   Specimen: Nasopharyngeal Swab  Result Value Ref Range Status   SARS Coronavirus 2 by RT PCR NEGATIVE NEGATIVE Final    Comment: (NOTE) SARS-CoV-2 target nucleic acids are NOT DETECTED.  The SARS-CoV-2 RNA is generally detectable in upper respiratoy specimens during the acute phase of infection. The lowest concentration of SARS-CoV-2 viral copies this assay can detect is 131 copies/mL. A negative result does not preclude SARS-Cov-2 infection and should not be used as the sole basis for treatment or other patient management decisions. A negative result may  occur with  improper specimen collection/handling, submission of specimen other than nasopharyngeal swab, presence of viral mutation(s) within the areas targeted by this assay, and inadequate number of viral copies (<131 copies/mL). A negative result must be combined with clinical observations, patient history, and epidemiological information. The expected result is Negative.  Fact  Sheet for Patients:  PinkCheek.be  Fact Sheet for Healthcare Providers:  GravelBags.it  This test is no t yet approved or cleared by the [place] FDA and  has been authorized for detection and/or diagnosis of SARS-CoV-2 by FDA under an Emergency Use Authorization (EUA). This EUA will remain  in effect (meaning this test can be used) for the duration of the COVID-19 declaration under Section 564(b)(1) of the Act, 21 U.S.C. section 360bbb-3(b)(1), unless the authorization is terminated or revoked sooner.     Influenza A by PCR NEGATIVE NEGATIVE Final   Influenza B by PCR NEGATIVE NEGATIVE Final    Comment: (NOTE) The Xpert Xpress SARS-CoV-2/FLU/RSV assay is intended as an aid in  the diagnosis of influenza from Nasopharyngeal swab specimens and  should not be used as a sole basis for treatment. Nasal washings and  aspirates are unacceptable for Xpert Xpress SARS-CoV-2/FLU/RSV  testing.  Fact Sheet for Patients: PinkCheek.be  Fact Sheet for Healthcare Providers: GravelBags.it  This test is not yet approved or cleared by the [place] FDA and  has been authorized for detection and/or diagnosis of SARS-CoV-2 by  FDA under an Emergency Use Authorization (EUA). This EUA will remain  in effect (meaning this test can be used) for the duration of the  Covid-19 declaration under Section 564(b)(1) of the Act, 21  U.S.C. section 360bbb-3(b)(1), unless the authorization is  terminated or revoked. Performed at [facility], [address], [place], [place] [address]   Urine culture     Status: None   Collection Time: 06/15/20  1:19 AM   Specimen: In/Out Cath Urine  Result Value Ref Range Status   Specimen Description   Final    IN/OUT CATH URINE Performed at [facility], [address] [place], [place] [address]    Special Requests   Final    NONE Performed at  [facility], [address], [place], [place] [address]    Culture   Final    NO GROWTH Performed at [facility], [place] [address], [place], [place] [address]    Report Status 06/16/2020 FINAL  Final  Blood culture (routine x 2)     Status: Abnormal   Collection Time: 06/15/20  1:38 AM   Specimen: BLOOD RIGHT HAND  Result Value Ref Range Status   Specimen Description   Final    BLOOD RIGHT HAND Performed at [facility], [address], [place], [place] [address]    Special Requests   Final    BOTTLES DRAWN AEROBIC AND ANAEROBIC Blood Culture adequate volume Performed at [facility], [address] [place], [place] [address]    Culture  Setup Time   Final    GRAM NEGATIVE RODS ANAEROBIC BOTTLE ONLY Gram Stain Report Called to,Read Back By and Verified With: Rudi Coco [id number] 10/10/2021KAY GRAM NEGATIVE RODS AEROBIC BOTTLE ONLY Gram Stain Report Called to,Read Back By and Verified With: ANNE TUTTLE,RN @1449  06/15/2020 KAY CRITICAL VALUE NOTED.  VALUE IS CONSISTENT WITH PREVIOUSLY REPORTED AND CALLED VALUE.    Culture (A)  Final    KLEBSIELLA PNEUMONIAE SUSCEPTIBILITIES PERFORMED ON PREVIOUS CULTURE WITHIN THE LAST 5 DAYS. Performed at [facility], [address] [place], [place] [address]    Report Status 06/17/2020 FINAL  Final  Blood Culture (routine x 2)     Status: Abnormal   Collection Time: 06/15/20  1:50 AM   Specimen: BLOOD LEFT ARM  Result Value Ref Range Status   Specimen Description   Final    BLOOD LEFT ARM Performed at [facility], [address] [place], [place] [address]    Special Requests   Final    BOTTLES DRAWN AEROBIC AND ANAEROBIC Blood Culture adequate volume Performed at [facility], [address] [place], [place] [address]    Culture  Setup Time   Final    GRAM NEGATIVE RODS ANAEROBIC BOTTLE ONLY Gram Stain Report Called to,Read Back By and Verified With: ANNE TUTTLE,RN  @1449  06/15/2020 KAY GRAM NEGATIVE RODS AEROBIC BOTTLE  ONLY Gram Stain Report Called to,Read Back By and Verified With: ANNE TUTTLE,RN @1449  06/15/2020 KAY CRITICAL RESULT CALLED TO, READ BACK BY AND VERIFIED WITH: PHARMD K MEYER [id number] AT 1210 BY CM Performed at [facility], [place] [address] [place], [place] [address]    Culture KLEBSIELLA PNEUMONIAE (A)  Final   Report Status 06/17/2020 FINAL  Final   Organism ID, Bacteria KLEBSIELLA PNEUMONIAE  Final      Susceptibility   Klebsiella pneumoniae - MIC*    AMPICILLIN RESISTANT Resistant     CEFAZOLIN <=4 SENSITIVE Sensitive     CEFEPIME <=0.12 SENSITIVE Sensitive     CEFTAZIDIME <=1 SENSITIVE Sensitive     CEFTRIAXONE <=0.25 SENSITIVE Sensitive     CIPROFLOXACIN <=0.25 SENSITIVE Sensitive     GENTAMICIN <=1 SENSITIVE Sensitive     IMIPENEM 0.5 SENSITIVE Sensitive     TRIMETH/SULFA <=20 SENSITIVE Sensitive     AMPICILLIN/SULBACTAM 4 SENSITIVE Sensitive     PIP/TAZO <=4 SENSITIVE Sensitive     * KLEBSIELLA PNEUMONIAE     Scheduled Meds: . budesonide (PULMICORT) nebulizer solution  0.5 mg Nebulization BID  . furosemide  60 mg Intravenous BID  . influenza vac split quadrivalent PF  0.5 mL Intramuscular Tomorrow-1000  . insulin aspart  0-15 Units Subcutaneous TID WC  . insulin aspart  0-5 Units Subcutaneous QHS  . insulin glargine  25 Units Subcutaneous Daily  . ipratropium-albuterol  3 mL Nebulization Q6H  . methylPREDNISolone (SOLU-MEDROL) injection  60 mg Intravenous Q12H  . [START ON 06/18/2020] metoprolol succinate  25 mg Oral Daily  . pantoprazole (PROTONIX) IV  40 mg Intravenous Q12H  . polyethylene glycol  17 g Oral Daily  . warfarin  5 mg Oral ONCE-1600  . Warfarin - Pharmacist Dosing Inpatient   Does not apply q1600   Continuous Infusions: . sodium chloride 500 mL (06/16/20 1451)  . cefTRIAXone (ROCEPHIN)  IV 2 g (06/17/20 1129)  . metronidazole 500 mg (06/17/20 1419)    Procedures/Studies: CT ABDOMEN PELVIS W CONTRAST  Result Date: 06/15/2020 CLINICAL DATA:   Acute abdominal pain, nonlocalized.  Cholelithiasis. EXAM: CT ABDOMEN AND PELVIS WITH CONTRAST TECHNIQUE: Multidetector CT imaging of the abdomen and pelvis was performed using the standard protocol following bolus administration of intravenous contrast. CONTRAST:  161mL OMNIPAQUE IOHEXOL 300 MG/ML  SOLN COMPARISON:  04/23/2019 FINDINGS: Lower chest: The visualized lung bases demonstrates interlobular septal thickening and ground-glass pulmonary infiltrate likely representing pulmonary edema, possibly cardiogenic in nature. This appears improved since prior CT examination of 04/18/2020. Coronary artery stenting has been performed. Cardiac size is mildly enlarged. No pericardial effusion. Hepatobiliary: Cholelithiasis. No pericholecystic inflammatory change  identified. Liver unremarkable. No intra or extrahepatic biliary ductal dilation. Pancreas: Unremarkable Spleen: Unremarkable Adrenals/Urinary Tract: Multiple bilateral adrenal nodules are unchanged measuring up to 2.5 cm within the left adrenal gland and compatible with multiple adenoma, better evaluated on prior examination. Kidneys are unremarkable. Bladder is unremarkable. Stomach/Bowel: Stomach, small bowel, and large bowel are unremarkable. Appendix normal. No free intraperitoneal gas or fluid. Vascular/Lymphatic: Extensive aortoiliac atherosclerotic calcification is present. No aortic aneurysm. No pathologic adenopathy within the abdomen and pelvis. Reproductive: Uterus and bilateral adnexa are unremarkable. Other: Rectum unremarkable Musculoskeletal: No acute bone abnormality. Remote compression fracture of T9 is unchanged. Superior endplate fracture of L3 is unchanged. IMPRESSION: Bibasilar pulmonary infiltrates and septal thickening in keeping with mild to moderate pulmonary edema, possibly cardiogenic in nature. This appears improved. Cholelithiasis without CT evidence of acute cholecystitis. Multiple stable adrenal adenoma. Biochemical correlation may  be helpful to assess for hyperfunctioning. Aortic Atherosclerosis (ICD10-I70.0). Electronically Signed   By: Fidela Salisbury MD   On: 06/15/2020 04:03   MR 3D Recon At Scanner  Result Date: 06/16/2020 CLINICAL DATA:  Evaluate for gallstones.  Elevated bilirubin levels. EXAM: MRI ABDOMEN WITHOUT AND WITH CONTRAST (INCLUDING MRCP) TECHNIQUE: Multiplanar multisequence MR imaging of the abdomen was performed both before and after the administration of intravenous contrast. Heavily T2-weighted images of the biliary and pancreatic ducts were obtained, and three-dimensional MRCP images were rendered by post processing. CONTRAST:  4mL GADAVIST GADOBUTROL 1 MMOL/ML IV SOLN COMPARISON:  Right upper quadrant sonogram 06/15/2020 and CT AP 06/15/2020. FINDINGS: Diminished exam detail due to respiratory motion artifact. Lower chest: No acute findings. Cardiac enlargement. There is reflux of contrast material from the right heart into the IVC and right hepatic veins seen on the arterial phase images suggesting passive venous congestion secondary to right heart failure. Hepatobiliary: No suspicious focal liver abnormality identified. Stones are identified within the gallbladder as seen on previous imaging. These measure up to 5 mm. Upper limits of normal gallbladder wall thickening measuring up to 3 mm. No intrahepatic or common bile duct dilatation. No signs of choledocholithiasis. Pancreas: No mass, inflammatory changes, or other parenchymal abnormality identified. Spleen:  Within normal limits in size and appearance. Adrenals/Urinary Tract: Bilateral adrenal nodules are identified which exhibit loss of signal on out of phase sequences compatible with benign adenomas. No kidney mass or hydronephrosis identified. Stomach/Bowel: Visualized portions within the abdomen are unremarkable. Vascular/Lymphatic: No pathologically enlarged lymph nodes identified. Aortic atherosclerosis. No abdominal aortic aneurysm demonstrated. Other:   None. Musculoskeletal: No suspicious bone lesions identified. IMPRESSION: 1. Diminished exam detail due to respiratory motion artifact. 2. Gallstones as seen on previous imaging. Upper limits of normal gallbladder wall thickening. No intrahepatic or common bile duct dilatation. No signs of choledocholithiasis. 3. Bilateral adrenal adenomas. Electronically Signed   By: Kerby Moors M.D.   On: 06/16/2020 10:56   DG Chest Port 1 View  Result Date: 06/15/2020 CLINICAL DATA:  Shortness of breath EXAM: PORTABLE CHEST 1 VIEW COMPARISON:  04/17/2020 FINDINGS: The tracheostomy tube terminates above the carina. The patient is status post prior median sternotomy. The heart size is enlarged. Coarse hazy bilateral airspace opacities are noted. These have improved since the prior study. There is architectural distortion and areas of scarring, similar to prior study. There is blunting of the costophrenic angles bilaterally similar to prior study. There is no acute osseous abnormality. IMPRESSION: Cardiomegaly with chronic bilateral airspace opacities. No definite acute cardiopulmonary process. Electronically Signed   By: Jamie Kato.D.  On: 06/15/2020 01:02   MR ABDOMEN MRCP W WO CONTAST  Result Date: 06/16/2020 CLINICAL DATA:  Evaluate for gallstones.  Elevated bilirubin levels. EXAM: MRI ABDOMEN WITHOUT AND WITH CONTRAST (INCLUDING MRCP) TECHNIQUE: Multiplanar multisequence MR imaging of the abdomen was performed both before and after the administration of intravenous contrast. Heavily T2-weighted images of the biliary and pancreatic ducts were obtained, and three-dimensional MRCP images were rendered by post processing. CONTRAST:  59mL GADAVIST GADOBUTROL 1 MMOL/ML IV SOLN COMPARISON:  Right upper quadrant sonogram 06/15/2020 and CT AP 06/15/2020. FINDINGS: Diminished exam detail due to respiratory motion artifact. Lower chest: No acute findings. Cardiac enlargement. There is reflux of contrast material  from the right heart into the IVC and right hepatic veins seen on the arterial phase images suggesting passive venous congestion secondary to right heart failure. Hepatobiliary: No suspicious focal liver abnormality identified. Stones are identified within the gallbladder as seen on previous imaging. These measure up to 5 mm. Upper limits of normal gallbladder wall thickening measuring up to 3 mm. No intrahepatic or common bile duct dilatation. No signs of choledocholithiasis. Pancreas: No mass, inflammatory changes, or other parenchymal abnormality identified. Spleen:  Within normal limits in size and appearance. Adrenals/Urinary Tract: Bilateral adrenal nodules are identified which exhibit loss of signal on out of phase sequences compatible with benign adenomas. No kidney mass or hydronephrosis identified. Stomach/Bowel: Visualized portions within the abdomen are unremarkable. Vascular/Lymphatic: No pathologically enlarged lymph nodes identified. Aortic atherosclerosis. No abdominal aortic aneurysm demonstrated. Other:  None. Musculoskeletal: No suspicious bone lesions identified. IMPRESSION: 1. Diminished exam detail due to respiratory motion artifact. 2. Gallstones as seen on previous imaging. Upper limits of normal gallbladder wall thickening. No intrahepatic or common bile duct dilatation. No signs of choledocholithiasis. 3. Bilateral adrenal adenomas. Electronically Signed   By: Kerby Moors M.D.   On: 06/16/2020 10:56   [place] EKG SITE RITE  Result Date: 06/16/2020 If Site Rite image not attached, placement could not be confirmed due to current cardiac rhythm.  US Abdomen Limited RUQ  Result Date: 06/15/2020 CLINICAL DATA:  Epigastric abdomen pain since yesterday. EXAM: ULTRASOUND ABDOMEN LIMITED RIGHT UPPER QUADRANT COMPARISON:  CT abdomen and pelvis June 15, 2020 FINDINGS: Gallbladder: Gallstones are noted the gallbladder. There is no pericholecystic fluid. Gallbladder wall measures 2.7 mm. No  sonographic Murphy sign noted by sonographer. Common bile duct: Diameter: 3.6 mm Liver: No focal lesion identified. Within normal limits in parenchymal echogenicity. Portal vein is patent on color Doppler imaging with normal direction of blood flow towards the liver. Other: None. IMPRESSION: Cholelithiasis without sonographic evidence of acute cholecystitis. Electronically Signed   By: Abelardo Diesel M.D.   On: 06/15/2020 11:36    Orson Eva, DO  [facility]  If 7PM-7AM, please contact night-coverage [email or web address] Password [email or web address] 06/17/2020, 4:07 PM   LOS: 2 days

## 2020-06-17 NOTE — Progress Notes (Signed)
Pt sat up on side of bed for approximately last 3 hours, tolerated well. No c/o n/v or abd pain. SaO2 95% on 6lpm [place]. Assisted pt to wash her hair in the sink, no c/o SOB or pain with movement, ambulated well, steady gait.

## 2020-06-17 NOTE — Progress Notes (Signed)
Inpatient Diabetes Program Recommendations  AACE/ADA: New Consensus Statement on Inpatient Glycemic Control   Target Ranges:  Prepandial:   less than 140 mg/dL      Peak postprandial:   less than 180 mg/dL (1-2 hours)      Critically ill patients:  140 - 180 mg/dL     Review of Glycemic Control Results for Theresa [name], Theresa [name] (MRN [id number]) as of 06/17/2020 11:55  Ref. Range 06/16/2020 21:51 06/17/2020 07:54 06/17/2020 11:32  Glucose-Capillary Latest Ref Range: 70 - 99 mg/dL 210 (H) 188 (H) 297 (H)   Diabetes history: DM2 Outpatient Diabetes medications: Tresiba 50 units QHS, Novolog 1-10 units TID with meals, Ozempic 0.5 mg Qweek Current orders for Inpatient glycemic control: Lantus 25 units daily, Novolog 0-15 units TID with meals, Novolog 0-5 units QHS; Solumedrol 60 mg Q12H  Inpatient Diabetes Program Recommendations:    -  Consider increasing Lantus to 30 units daily  - Consider Novolog 3 units tid meal coverage if eating >50% of meals.  Thanks, Tama Headings RN, MSN, BC-ADM Inpatient Diabetes Coordinator Team Pager [phone] (8a-5p)

## 2020-06-17 NOTE — Progress Notes (Signed)
SaO2 up to 92%. Resp even and nonlabored. Pt states she still thinks she'd be more comfortable with the [place], awaiting RT in to assess pt.

## 2020-06-17 NOTE — Progress Notes (Addendum)
Subjective: Abdominal pain primarily in the RUQ but is improving slowly. Tolerated clear liquid diet well yesterday. Mild nausea yesterday without vomiting. No nausea or vomiting this morning. Had about 4 small BMs yesterday that were like "rabbit pellets". Doesn't feel constipated. No melena or brbpr.   Objective: Vital signs in last 24 hours: Temp:  [97.9 F (36.6 C)-98.2 F (36.8 C)] 97.9 F (36.6 C) (10/12 0550) Pulse Rate:  [85-92] 85 (10/12 0550) Resp:  [18] 18 (10/12 0550) BP: (127-134)/(76-88) 127/76 (10/12 0550) SpO2:  [91 %-98 %] 98 % (10/12 0732) FiO2 (%):  [40 %-60 %] 40 % (10/12 0732) Last BM Date: 06/16/20 General:   Alert and oriented, pleasant Head:  Normocephalic and atraumatic. Eyes:  No icterus, sclera clear. Conjuctiva pink.   Abdomen:  Bowel sounds present, soft, non-distended. Moderate TTP in RUQ, RLQ, and LLQ. Mild TTP in epigastric area. Per patient, she has had pain in LLQ since presenting to the hospital and it is improving. No rebound or guarding. No masses appreciated  Msk:  Symmetrical without gross deformities.  Extremities:  Without edema. Neurologic:  Alert and  oriented x4;  grossly normal neurologically. Psych: Normal mood and affect.  Intake/Output from previous day: 10/11 0701 - 10/12 0700 In: 5809 [P.O.:1160; I.V.:82.2; IV Piggyback:500.9] Out: 1250 [Urine:1250] Intake/Output this shift: No intake/output data recorded.  Lab Results: Recent Labs    06/15/20 0014 06/16/20 0712 06/17/20 0556  WBC 14.8* 10.1 15.9*  HGB 12.2 11.3* 12.0  HCT 38.0 35.0* 38.0  PLT 247 214 291   BMET Recent Labs    06/15/20 0014 06/16/20 0712 06/17/20 0556  NA 138 137 141  K 2.8* 3.8 3.3*  CL 88* 93* 90*  CO2 36* 33* 34*  GLUCOSE 232* 370* 192*  BUN 13 22* 25*  CREATININE 0.90 1.02* 1.07*  CALCIUM 8.5* 8.3* 8.6*   LFT Recent Labs    06/15/20 0014 06/16/20 0712 06/17/20 0556  PROT 6.9 6.9 7.2  ALBUMIN 3.0* 2.7* 3.0*  AST 485* 127* 67*   ALT 163* 147* 127*  ALKPHOS 232* 227* 214*  BILITOT 3.0* 5.3* 2.4*   PT/INR Recent Labs    06/15/20 0014 06/16/20 0712  LABPROT 22.1* 21.7*  INR 2.0* 2.0*    Studies/Results: MR 3D Recon At Scanner  Result Date: 06/16/2020 CLINICAL DATA:  Evaluate for gallstones.  Elevated bilirubin levels. EXAM: MRI ABDOMEN WITHOUT AND WITH CONTRAST (INCLUDING MRCP) TECHNIQUE: Multiplanar multisequence MR imaging of the abdomen was performed both before and after the administration of intravenous contrast. Heavily T2-weighted images of the biliary and pancreatic ducts were obtained, and three-dimensional MRCP images were rendered by post processing. CONTRAST:  35m GADAVIST GADOBUTROL 1 MMOL/ML IV SOLN COMPARISON:  Right upper quadrant sonogram 06/15/2020 and CT AP 06/15/2020. FINDINGS: Diminished exam detail due to respiratory motion artifact. Lower chest: No acute findings. Cardiac enlargement. There is reflux of contrast material from the right heart into the IVC and right hepatic veins seen on the arterial phase images suggesting passive venous congestion secondary to right heart failure. Hepatobiliary: No suspicious focal liver abnormality identified. Stones are identified within the gallbladder as seen on previous imaging. These measure up to 5 mm. Upper limits of normal gallbladder wall thickening measuring up to 3 mm. No intrahepatic or common bile duct dilatation. No signs of choledocholithiasis. Pancreas: No mass, inflammatory changes, or other parenchymal abnormality identified. Spleen:  Within normal limits in size and appearance. Adrenals/Urinary Tract: Bilateral adrenal nodules are identified which exhibit loss  of signal on out of phase sequences compatible with benign adenomas. No kidney mass or hydronephrosis identified. Stomach/Bowel: Visualized portions within the abdomen are unremarkable. Vascular/Lymphatic: No pathologically enlarged lymph nodes identified. Aortic atherosclerosis. No abdominal  aortic aneurysm demonstrated. Other:  None. Musculoskeletal: No suspicious bone lesions identified. IMPRESSION: 1. Diminished exam detail due to respiratory motion artifact. 2. Gallstones as seen on previous imaging. Upper limits of normal gallbladder wall thickening. No intrahepatic or common bile duct dilatation. No signs of choledocholithiasis. 3. Bilateral adrenal adenomas. Electronically Signed   By: Kerby Moors M.D.   On: 06/16/2020 10:56   MR ABDOMEN MRCP W WO CONTAST  Result Date: 06/16/2020 CLINICAL DATA:  Evaluate for gallstones.  Elevated bilirubin levels. EXAM: MRI ABDOMEN WITHOUT AND WITH CONTRAST (INCLUDING MRCP) TECHNIQUE: Multiplanar multisequence MR imaging of the abdomen was performed both before and after the administration of intravenous contrast. Heavily T2-weighted images of the biliary and pancreatic ducts were obtained, and three-dimensional MRCP images were rendered by post processing. CONTRAST:  56m GADAVIST GADOBUTROL 1 MMOL/ML IV SOLN COMPARISON:  Right upper quadrant sonogram 06/15/2020 and CT AP 06/15/2020. FINDINGS: Diminished exam detail due to respiratory motion artifact. Lower chest: No acute findings. Cardiac enlargement. There is reflux of contrast material from the right heart into the IVC and right hepatic veins seen on the arterial phase images suggesting passive venous congestion secondary to right heart failure. Hepatobiliary: No suspicious focal liver abnormality identified. Stones are identified within the gallbladder as seen on previous imaging. These measure up to 5 mm. Upper limits of normal gallbladder wall thickening measuring up to 3 mm. No intrahepatic or common bile duct dilatation. No signs of choledocholithiasis. Pancreas: No mass, inflammatory changes, or other parenchymal abnormality identified. Spleen:  Within normal limits in size and appearance. Adrenals/Urinary Tract: Bilateral adrenal nodules are identified which exhibit loss of signal on out of  phase sequences compatible with benign adenomas. No kidney mass or hydronephrosis identified. Stomach/Bowel: Visualized portions within the abdomen are unremarkable. Vascular/Lymphatic: No pathologically enlarged lymph nodes identified. Aortic atherosclerosis. No abdominal aortic aneurysm demonstrated. Other:  None. Musculoskeletal: No suspicious bone lesions identified. IMPRESSION: 1. Diminished exam detail due to respiratory motion artifact. 2. Gallstones as seen on previous imaging. Upper limits of normal gallbladder wall thickening. No intrahepatic or common bile duct dilatation. No signs of choledocholithiasis. 3. Bilateral adrenal adenomas. Electronically Signed   By: TKerby MoorsM.D.   On: 06/16/2020 10:56   [place] SITE RITE  Result Date: 06/16/2020 If Site Rite image not attached, placement could not be confirmed due to current cardiac rhythm.  [place]domen Limited RUQ  Result Date: 06/15/2020 CLINICAL DATA:  Epigastric abdomen pain since yesterday. EXAM: ULTRASOUND ABDOMEN LIMITED RIGHT UPPER QUADRANT COMPARISON:  CT abdomen and pelvis June 15, 2020 FINDINGS: Gallbladder: Gallstones are noted the gallbladder. There is no pericholecystic fluid. Gallbladder wall measures 2.7 mm. No sonographic Murphy sign noted by sonographer. Common bile duct: Diameter: 3.6 mm Liver: No focal lesion identified. Within normal limits in parenchymal echogenicity. Portal vein is patent on color Doppler imaging with normal direction of blood flow towards the liver. Other: None. IMPRESSION: Cholelithiasis without sonographic evidence of acute cholecystitis. Electronically Signed   By: WAbelardo DieselM.D.   On: 06/15/2020 11:36    Assessment: 54year old female with multiple medical issues (CHF, DVTs on Coumadin, COPD, diabetes, lupus anticoagulant syndrome, CAD, history of STEMI and CABG, chronic respiratory failure, chronic trach since 2002) admitted with sepsis with concerns for cholangitis. Blood cultures  with  gram negative rods, klebsiellia pneumoniae. She is on IV antibiotics.   US abdomen with cholelithiasis, CBD 3.6 mm. Acutely elevated LFTs with bilirubin 3, AST 485, ALT 163, Alk Phos 232 on admission. MRI/MRCP 06/16/20 with stones within the gallbladder, upper limits of normal gallbladder wall thickening, no intrahepatic or CBD dilation, no signs of choledocholithiasis. LFTs improving. Total bilirubin peaked yesterday at 5.3 but is down to 2.4 today. WBC bumped back up to 15.9 today from 10.2 yesterday. No fever or mental status changes. Patient reports abdominal pain is starting to improve. Tolerated clear liquids very well yesterday. Surgery has been on board with no plans for surgical intervention at this time. There was mention of possible cholecystostomy tube placement should she worsen, but this has been put on hold as she is improving clinically. In general, she is a very poor surgical candidate due to her complex medical history. Previously evaluated in [place] for cholecystectomy due to symptomatic gallstones in 2018 but was told she is a high risk surgical candidate due to active cardiac disease at the time (per patient).   She will need to continue antibiotics. Diet has been advanced per surgery today to full liquids.   LLQ Pain: She has moderate TTP in LLQ on exam today. I do not see that this has been documented during this hospital admission. No acute findings on CT on 06/15/20 to explain LLQ pain. Per patient this has been present since she presented to the hospital but is improving. Would not expect diverticulitis/colitis as she has been on antibiotics. May have influence from constipation as she notes passing small balls of stool. Will need to consider repeat CT if this doesn't continue to improve.    Plan: Continue IV antibiotics Continue to follow LFTs Continue PPI BID.  Advance diet to full liquids per general surgery.  Add MiraLAX 17g daily.  Consider repeat CT if  worsening/persistent LLQ abdominal pain or if WBC continues to increase.  Coumadin has been on hold; appreciate pharmacy following.      LOS: 2 days    06/17/2020, 9:42 AM   Aliene Altes, PA-C [facility]  Gastroenterology attending attestation note: I personally reviewed the medical chart.  I agree with Mrs. Tivis Ringer Harper's H/P and assessment.  Patient has presented improvement in her symptoms after starting antibiotic treatment. Abdominal imaging was negative for presence of any choledocholithiasis yesterday. Overall, the patient has presented improvement in her abdominal pain and has been tolerating diet. Physical exam was remarkable for right lower quadrant pain and left lower quadrant pain but better compared to prior without presence of any guarding or rebound. She has not present any more fever spikes since yesterday, blood pressure has fluctuated but systolic blood pressure has been above 90. Only remarkable thing is that she has presented worsening leukocytosis. At this point, we agree with continuing the antibiotic coverage. Overall she is a poor surgical candidate and will require tertiary center to have her cholecystectomy in the future. In case the patient has worsening abdominal pain and/or leukocytosis, will have a low threshold to repeat abdominal imaging. No need for endoscopic intervention at this point.  [name], MD Gastroenterology and Hepatology [facility]

## 2020-06-17 NOTE — Consult Note (Addendum)
Cardiology Consult    Patient ID: Theresa [name]; [id number]; 06-04-1966   Admit date: 06/14/2020 Date of Consult: 06/17/2020  Primary Care Provider: No primary care provider on file. Primary Cardiologist: [name], MD   Patient Profile    Theresa [name] is a 54 y.o. female with past medical history of CAD (s/p CABG in 2007 who is s/p STEMI in 10/2015 with DES to dSVG-PDA, repeat cath in 03/2018 showing patent LIMA-LAD and occluded SVG-RCA and RCA territory supplied by collaterals from LAD and LCx), chronic combined systolic and diastolic CHF, chronic hypoxic respiratory failure (s/p trach in 2002 and on supplemental oxygen), lupus anticoagulant disorder with prior DVT (on Coumadin), HTN, HLD and Type 2 DM who is being seen today for the evaluation of CHF at the request of Dr. Carles Collet.   History of Present Illness    Theresa [name] was last examined by Sande Rives, PA-C in 04/2020 following a recent admission for an acute CHF exacerbation and weight at the time of discharge was 205 lbs. Weight had increased by 9 lbs at the time of her follow-up visit, therefore Torsemide was increased to 52m in AM/471min PM for 3 days then with instructions to go back to 405mID. Was continued on Losartan 83m39mily and Toprol-XL 50mg85mly.   [facility] evaluation of worsening abdominal pain. Was found to be febrile to 100.7. Initial labs showed WBC 14.8, Hgb 12.2, platelets 247, Na+ 138, K+ 2.8 and creatinine 0.90. AST 485 and ALT 163. Alk Phos 232. Lactic Acid 2.8. Blood cultures positive for E.coli. CXR showed cardiomegaly with chronic bilateral airspace opacities. CT Abdomen showed pulmonary edema and cholelithiasis without acute cholecystitis. She was admitted for sepsis secondary to presumed cholecystitis and received IVF along with Cefepime and Metronidazole. Received 3L IVF by review of MAR due to sepsis but I&O's not complete (listed as -1.2 L this admission).    GI and General Surgery were consulted and she was not felt to be an ideal surgical candidate. Underwent MCRP on 10/11 showed gallstones as seen on previous imaging but no intrahepatic or common bile duct dilatation and no signs of choledocholithiasis.   In talking with the patient and her husband today, she reports still having some abdominal discomfort and is concerned this will not resolve without surgical intervention. Over the past few days, she has started to experience worsening orthopnea, abdominal distension and LLE. She mentioned this to the Hospitalist yesterday and was started on IV Lasix 60mg 2mas her weight had increased to 221 lbs (previously 200 - 205 lbs on her home scales). PTA Losartan and Toprol-XL have been held given soft BP.    Past Medical History:  Diagnosis Date  . Acute kidney failure with lesion of tubular necrosis (HCC)   . Acute systolic heart failure (HCC)  [place]    a. s/p prior PCI. [facility]. inferior STEMI 10/2015 s/p DES to dSVG-PDA.  . Cardiac arrest (HCC)  [place] (H[place]  [place] diastolic CHF (congestive heart failure) (HCC)  [place] respiratory failure (HCC[place]  [place]/p tracheostomy 2002  . Chronic RUQ pain   . COPD (chronic obstructive pulmonary disease) (HCC)  [place] (HCC)  [place] (deep venous thrombosis) (HCC)  [place]ometri[place]   . History of gallstones 01/2016  seen on Ultrasound  . History of HIDA scan 11/2016   normal  . HTN (hypertension)   . Hyperlipidemia   . Lupus anticoagulant disorder (HCC)    on coumadin  . Morbid obesity ([place])   . Psoriasis   . ST elevation (STEMI) myocardial infarction involving right coronary artery ([place]) 10/29/15   stent to VG to PDA  . Toe fracture, right 03/29/2018  . Tracheostomy in place [facility]), chronic since 2002 11/03/2015  . Tracheostomy status [facility])     Past Surgical History:  Procedure Laterality Date   . CARDIAC CATHETERIZATION N/A 10/29/2015   Procedure: Left Heart Cath and Cors/Grafts Angiography;  Surgeon: Burnell Blanks, MD;  Location: [place] [facility];  Service: Cardiovascular;  Laterality: N/A;  . CARDIAC CATHETERIZATION  10/29/2015   Procedure: Coronary Stent Intervention;  Surgeon: Burnell Blanks, MD;  Location: [place] [facility];  Service: Cardiovascular;;  . CAROTID STENT    . CESAREAN SECTION WITH BILATERAL TUBAL LIGATION    . CORONARY ARTERY BYPASS GRAFT  2007   2V  . IR GASTROSTOMY TUBE MOD SED  11/13/2018  . RIGHT/LEFT HEART CATH AND CORONARY/GRAFT ANGIOGRAPHY N/A 03/24/2018   Procedure: RIGHT/LEFT HEART CATH AND CORONARY/GRAFT ANGIOGRAPHY;  Surgeon: Belva Crome, MD;  Location: [place] CV [facility];  Service: Cardiovascular;  Laterality: N/A;  . TRACHEOSTOMY       Home Medications:  Prior to Admission medications   Medication Sig Start Date End Date Taking? Authorizing Provider  albuterol (PROVENTIL) (2.5 MG/3ML) 0.083% nebulizer solution Take 3 mLs (2.5 mg total) by nebulization 2 (two) times daily. Patient taking differently: Take 2.5 mg by nebulization every 4 (four) hours as needed for wheezing.  10/19/18  Yes Erick Colace, NP  albuterol (VENTOLIN HFA) 108 (90 Base) MCG/ACT inhaler Inhale 2 puffs into the lungs every 4 (four) hours as needed for wheezing or shortness of breath.   Yes [provider]  atorvastatin (LIPITOR) 80 MG tablet Take 1 tablet (80 mg total) by mouth daily. 05/09/20  Yes Sande Rives E, PA-C  gabapentin (NEURONTIN) 300 MG capsule Take 1 capsule (359m) in the morning, 1 capsule (3058m in the afternoon, and 2 capsules (60048mat bedtime Patient taking differently: Take 900 mg by mouth at bedtime.  01/19/19  Yes GooSarajane Jewsallie E, PA-C  insulin degludec (TRESIBA FLEXTOUCH) 100 UNIT/ML FlexTouch Pen Inject 50 Units into the skin at bedtime.    Yes [provider]  LORazepam (ATIVAN) 0.5 MG tablet Take 0.5 mg by  mouth every 6 (six) hours as needed for anxiety.    Yes [provider]  losartan (COZAAR) 25 MG tablet Take 1 tablet (25 mg total) by mouth daily. 01/20/19  Yes GooSande Rives PA-C  metoprolol succinate (TOPROL-XL) 50 MG 24 hr tablet Take 1 tablet (50 mg total) by mouth daily. 07/27/19  Yes HocMinus BreedingD  nitroGLYCERIN (NITROSTAT) 0.4 MG SL tablet Place 0.4 mg under the tongue every 5 (five) minutes as needed for chest pain.   Yes [provider]  NOVOLOG FLEXPEN 100 UNIT/ML FlexPen Inject 1-10 Units into the skin 3 (three) times daily before meals. 04/06/20  Yes [provider]  OZEMPIC, 0.25 OR 0.5 MG/DOSE, 2 MG/1.5ML SOPN Inject 0.5 mg into the skin every Wednesday.  04/08/19  Yes [provider]  pantoprazole (PROTONIX) 20 MG tablet Take 20 mg by mouth daily. 02/16/20  Yes [provider]  potassium chloride SA (KLOR-CON) 20 MEQ tablet Take 20 mEq  by mouth daily. 04/17/20  Yes [provider]  sertraline (ZOLOFT) 50 MG tablet Take 50 mg by mouth at bedtime.    Yes [provider]  tamsulosin (FLOMAX) 0.4 MG CAPS capsule Take 0.4 mg by mouth daily.   Yes [provider]  torsemide (DEMADEX) 20 MG tablet Take 2 tablets (40 mg total) by mouth 2 (two) times daily. 04/25/20  Yes Charlynne Cousins, MD  traMADol (ULTRAM) 50 MG tablet Take 50 mg by mouth 3 (three) times daily as needed for moderate pain.  03/29/20  Yes [provider]  warfarin (COUMADIN) 2.5 MG tablet Take 2 tablets (5 mg) tonight at 6 PM.  Go to your PCP for INR tomorrow. Patient taking differently: Take 2.5-5 mg by mouth See admin instructions. Mon,Wed,Fri take 70m  2.578mall other days 05/17/19  Yes GoMercy RidingMD    Inpatient Medications: Scheduled Meds: . budesonide (PULMICORT) nebulizer solution  0.5 mg Nebulization BID  . furosemide  60 mg Intravenous BID  . influenza vac split quadrivalent PF  0.5 mL Intramuscular Tomorrow-1000  .  insulin aspart  0-15 Units Subcutaneous TID WC  . insulin aspart  0-5 Units Subcutaneous QHS  . insulin glargine  25 Units Subcutaneous Daily  . ipratropium-albuterol  3 mL Nebulization Q6H  . methylPREDNISolone (SOLU-MEDROL) injection  60 mg Intravenous Q12H  . pantoprazole (PROTONIX) IV  40 mg Intravenous Q12H   Continuous Infusions: . sodium chloride 500 mL (06/16/20 1451)  . cefTRIAXone (ROCEPHIN)  IV    . metronidazole 500 mg (06/17/20 0516)   PRN Meds: sodium chloride, albuterol, traMADol  Allergies:    Allergies  Allergen Reactions  . Penicillins Rash    Has patient had a PCN reaction causing immediate rash, facial/tongue/throat swelling, SOB or lightheadedness with hypotension: Yes Has patient had a PCN reaction causing severe rash involving mucus membranes or skin necrosis: No Has patient had a PCN reaction that required hospitalization No Has patient had a PCN reaction occurring within the last 10 years: No If all of the above answers are "NO", then may proceed with Cephalosporin use.   REACTION: rash Pt has tolerated cefepime in the past.  . Ciprofloxacin     nausea  . Ibuprofen Rash    swelling in leg     Social History:   Social History   Socioeconomic History  . Marital status: Married    Spouse name: Not on file  . Number of children: Not on file  . Years of education: Not on file  . Highest education level: Not on file  Occupational History  . Not on file  Tobacco Use  . Smoking status: Current Every Day Smoker    Packs/day: 0.75    Types: Cigarettes    Start date: 10/23/1978  . Smokeless tobacco: Never Used  . Tobacco comment: 1/2 pack - trying to cut back   Vaping Use  . Vaping Use: Never used  Substance and Sexual Activity  . Alcohol use: No    Alcohol/week: 0.0 standard drinks  . Drug use: No  . Sexual activity: Not on file  Other Topics Concern  . Not on file  Social History Narrative  . Not on file   Social Determinants of Health     Financial Resource Strain:   . Difficulty of Paying Living Expenses: Not on file  Food Insecurity:   . Worried About RuCharity fundraisern the Last Year: Not on file  . Ran Out of  Food in the Last Year: Not on file  Transportation Needs:   . Lack of Transportation (Medical): Not on file  . Lack of Transportation (Non-Medical): Not on file  Physical Activity:   . Days of Exercise per Week: Not on file  . Minutes of Exercise per Session: Not on file  Stress:   . Feeling of Stress : Not on file  Social Connections:   . Frequency of Communication with Friends and Family: Not on file  . Frequency of Social Gatherings with Friends and Family: Not on file  . Attends Religious Services: Not on file  . Active Member of Clubs or Organizations: Not on file  . Attends Archivist Meetings: Not on file  . Marital Status: Not on file  Intimate Partner Violence:   . Fear of Current or Ex-Partner: Not on file  . Emotionally Abused: Not on file  . Physically Abused: Not on file  . Sexually Abused: Not on file     Family History:    Family History  Problem Relation Age of Onset  . Hypertension Mother   . Diabetes Mother   . Hypertension Father   . Diabetes Father       Review of Systems    General:  No chills, fever, night sweats or weight changes.  Cardiovascular:  No chest pain,palpitations, paroxysmal nocturnal dyspnea. Positive for orthopnea and lower extremity edema.  Dermatological: No rash, lesions/masses Respiratory: No cough, dyspnea Urologic: No hematuria, dysuria Abdominal:   No diarrhea, bright red blood per rectum, melena, or hematemesis. Positive for abdominal pain and vomiting.  Neurologic:  No visual changes, wkns, changes in mental status. All other systems reviewed and are otherwise negative except as noted above.  Physical Exam/Data    Vitals:   06/16/20 2154 06/17/20 0550 06/17/20 0727 06/17/20 0732  BP: 134/88 127/76    Pulse: 90 85    Resp:  18     Temp: 98.2 F (36.8 C) 97.9 F (36.6 C)    TempSrc: Oral     SpO2: 91% 93% 98% 98%  Weight:      Height:        Intake/Output Summary (Last 24 hours) at 06/17/2020 1113 Last data filed at 06/17/2020 0300 Gross per 24 hour  Intake 1423.01 ml  Output 800 ml  Net 623.01 ml   Filed Weights   06/14/20 2347 06/16/20 0518  Weight: 99.8 kg 100.3 kg   Body mass index is 46.21 kg/m.   General: Pleasant, obese female appearing in NAD Psych: Normal affect. Neuro: Alert and oriented X 3. Moves all extremities spontaneously. HEENT: Trach in place.  Neck: Supple without bruits or JVD. Lungs:  Resp regular and unlabored, decreased breath sounds along bases bilaterally. On supplemental oxygen.  Heart: RRR no s3, s4, or murmurs. Abdomen: Soft, non-tender, non-distended, BS + x 4.  Extremities: No clubbing or cyanosis. 1+ pitting edema bilterally. DP/PT/Radials 2+ and equal bilaterally.  EKG:  The EKG was personally reviewed and demonstrates: NSR, HR 95 with no acute ST abnormalities when compared to prior tracings.   Labs/Studies     Relevant CV Studies:  Cardiac Catheterization: 03/2018  Severe native coronary artery disease with total occlusion of the proximal LAD, total occlusion of the proximal RCA, and patent circumflex with patent proximal stent with eccentric 50% proximal narrowing.  Distal obtuse marginal branches are severely and diffusely diseased.  Bypass graft failure with total occlusion of SVG to RCA.  Patent LIMA to LAD.  Right  coronary territory is supplied by collaterals from LAD and circumflex.  Left ventricular systolic dysfunction with EF 35 to 45%.  Mid to distal inferior wall akinesis.  Inferobasal and inferoapical wall severely hypokinetic.  Left ventricular end-diastolic pressure elevated at 19 mmHg  Moderate to severe pulmonary hypertension  RECOMMENDATIONS:   Resume Coumadin therapy overlap with heparin  Continue Plavix  Management of  pulmonary status to maintain adequate oxygenation which may help decrease severity of pulmonary hypertension.   Recommend to resume Warfarin, at currently prescribed dose and frequency, on today. Recommend concurrent antiplatelet therapy of Clopidogrel 79m daily for Indefinite.   Echocardiogram: 04/2020 IMPRESSIONS    1. Left ventricular ejection fraction, by estimation, is 40 to 45%. The  left ventricle has mildly decreased function. The left ventricle  demonstrates regional wall motion abnormalities (see scoring  diagram/findings for description). Left ventricular  diastolic parameters are consistent with Grade III diastolic dysfunction  (restrictive). Elevated left atrial pressure.  2. Right ventricular systolic function is normal. The right ventricular  size is normal. There is mildly elevated pulmonary artery systolic  pressure. The estimated right ventricular systolic pressure is 323.9mmHg.  3. Left atrial size was mildly dilated.  4. Right atrial size was mildly dilated.  5. The mitral valve is degenerative. Mild mitral valve regurgitation.  6. The aortic valve is tricuspid. Aortic valve regurgitation is not  visualized. Mild aortic valve sclerosis is present, with no evidence of  aortic valve stenosis.  7. The inferior vena cava is dilated in size with <50% respiratory  variability, suggesting right atrial pressure of 15 mmHg.   Laboratory Data:  Chemistry Recent Labs  Lab 06/15/20 0014 06/16/20 0712 06/17/20 0556  NA 138 137 141  K 2.8* 3.8 3.3*  CL 88* 93* 90*  CO2 36* 33* 34*  GLUCOSE 232* 370* 192*  BUN 13 22* 25*  CREATININE 0.90 1.02* 1.07*  CALCIUM 8.5* 8.3* 8.6*  GFRNONAA >60 >60 59*  ANIONGAP 14 11 17*    Recent Labs  Lab 06/15/20 0014 06/16/20 0712 06/17/20 0556  PROT 6.9 6.9 7.2  ALBUMIN 3.0* 2.7* 3.0*  AST 485* 127* 67*  ALT 163* 147* 127*  ALKPHOS 232* 227* 214*  BILITOT 3.0* 5.3* 2.4*   Hematology Recent Labs  Lab  06/15/20 0014 06/16/20 0712 06/17/20 0556  WBC 14.8* 10.1 15.9*  RBC 4.08 3.68* 3.99  HGB 12.2 11.3* 12.0  HCT 38.0 35.0* 38.0  MCV 93.1 95.1 95.2  MCH 29.9 30.7 30.1  MCHC 32.1 32.3 31.6  RDW 14.8 15.9* 15.9*  PLT 247 214 291   Cardiac EnzymesNo results for input(s): TROPONINI in the last 168 hours. No results for input(s): TROPIPOC in the last 168 hours.  BNP Recent Labs  Lab 06/16/20 0712  BNP 920.0*    DDimer No results for input(s): DDIMER in the last 168 hours.  Radiology/Studies:  CT ABDOMEN PELVIS W CONTRAST  Result Date: 06/15/2020 CLINICAL DATA:  Acute abdominal pain, nonlocalized.  Cholelithiasis. EXAM: CT ABDOMEN AND PELVIS WITH CONTRAST TECHNIQUE: Multidetector CT imaging of the abdomen and pelvis was performed using the standard protocol following bolus administration of intravenous contrast. CONTRAST:  1017mOMNIPAQUE IOHEXOL 300 MG/ML  SOLN COMPARISON:  04/23/2019 FINDINGS: Lower chest: The visualized lung bases demonstrates interlobular septal thickening and ground-glass pulmonary infiltrate likely representing pulmonary edema, possibly cardiogenic in nature. This appears improved since prior CT examination of 04/18/2020. Coronary artery stenting has been performed. Cardiac size is mildly enlarged. No pericardial effusion. Hepatobiliary: Cholelithiasis. No  pericholecystic inflammatory change identified. Liver unremarkable. No intra or extrahepatic biliary ductal dilation. Pancreas: Unremarkable Spleen: Unremarkable Adrenals/Urinary Tract: Multiple bilateral adrenal nodules are unchanged measuring up to 2.5 cm within the left adrenal gland and compatible with multiple adenoma, better evaluated on prior examination. Kidneys are unremarkable. Bladder is unremarkable. Stomach/Bowel: Stomach, small bowel, and large bowel are unremarkable. Appendix normal. No free intraperitoneal gas or fluid. Vascular/Lymphatic: Extensive aortoiliac atherosclerotic calcification is present. No  aortic aneurysm. No pathologic adenopathy within the abdomen and pelvis. Reproductive: Uterus and bilateral adnexa are unremarkable. Other: Rectum unremarkable Musculoskeletal: No acute bone abnormality. Remote compression fracture of T9 is unchanged. Superior endplate fracture of L3 is unchanged. IMPRESSION: Bibasilar pulmonary infiltrates and septal thickening in keeping with mild to moderate pulmonary edema, possibly cardiogenic in nature. This appears improved. Cholelithiasis without CT evidence of acute cholecystitis. Multiple stable adrenal adenoma. Biochemical correlation may be helpful to assess for hyperfunctioning. Aortic Atherosclerosis (ICD10-I70.0). Electronically Signed   By: Fidela Salisbury MD   On: 06/15/2020 04:03   MR 3D Recon At Scanner  Result Date: 06/16/2020 CLINICAL DATA:  Evaluate for gallstones.  Elevated bilirubin levels. EXAM: MRI ABDOMEN WITHOUT AND WITH CONTRAST (INCLUDING MRCP) TECHNIQUE: Multiplanar multisequence MR imaging of the abdomen was performed both before and after the administration of intravenous contrast. Heavily T2-weighted images of the biliary and pancreatic ducts were obtained, and three-dimensional MRCP images were rendered by post processing. CONTRAST:  71m GADAVIST GADOBUTROL 1 MMOL/ML IV SOLN COMPARISON:  Right upper quadrant sonogram 06/15/2020 and CT AP 06/15/2020. FINDINGS: Diminished exam detail due to respiratory motion artifact. Lower chest: No acute findings. Cardiac enlargement. There is reflux of contrast material from the right heart into the IVC and right hepatic veins seen on the arterial phase images suggesting passive venous congestion secondary to right heart failure. Hepatobiliary: No suspicious focal liver abnormality identified. Stones are identified within the gallbladder as seen on previous imaging. These measure up to 5 mm. Upper limits of normal gallbladder wall thickening measuring up to 3 mm. No intrahepatic or common bile duct  dilatation. No signs of choledocholithiasis. Pancreas: No mass, inflammatory changes, or other parenchymal abnormality identified. Spleen:  Within normal limits in size and appearance. Adrenals/Urinary Tract: Bilateral adrenal nodules are identified which exhibit loss of signal on out of phase sequences compatible with benign adenomas. No kidney mass or hydronephrosis identified. Stomach/Bowel: Visualized portions within the abdomen are unremarkable. Vascular/Lymphatic: No pathologically enlarged lymph nodes identified. Aortic atherosclerosis. No abdominal aortic aneurysm demonstrated. Other:  None. Musculoskeletal: No suspicious bone lesions identified. IMPRESSION: 1. Diminished exam detail due to respiratory motion artifact. 2. Gallstones as seen on previous imaging. Upper limits of normal gallbladder wall thickening. No intrahepatic or common bile duct dilatation. No signs of choledocholithiasis. 3. Bilateral adrenal adenomas. Electronically Signed   By: TKerby MoorsM.D.   On: 06/16/2020 10:56   DG Chest Port 1 View  Result Date: 06/15/2020 CLINICAL DATA:  Shortness of breath EXAM: PORTABLE CHEST 1 VIEW COMPARISON:  04/17/2020 FINDINGS: The tracheostomy tube terminates above the carina. The patient is status post prior median sternotomy. The heart size is enlarged. Coarse hazy bilateral airspace opacities are noted. These have improved since the prior study. There is architectural distortion and areas of scarring, similar to prior study. There is blunting of the costophrenic angles bilaterally similar to prior study. There is no acute osseous abnormality. IMPRESSION: Cardiomegaly with chronic bilateral airspace opacities. No definite acute cardiopulmonary process. Electronically Signed   By: CConstance Holster  M.D.   On: 06/15/2020 01:02   MR ABDOMEN MRCP W WO CONTAST  Result Date: 06/16/2020 CLINICAL DATA:  Evaluate for gallstones.  Elevated bilirubin levels. EXAM: MRI ABDOMEN WITHOUT AND WITH  CONTRAST (INCLUDING MRCP) TECHNIQUE: Multiplanar multisequence MR imaging of the abdomen was performed both before and after the administration of intravenous contrast. Heavily T2-weighted images of the biliary and pancreatic ducts were obtained, and three-dimensional MRCP images were rendered by post processing. CONTRAST:  27m GADAVIST GADOBUTROL 1 MMOL/ML IV SOLN COMPARISON:  Right upper quadrant sonogram 06/15/2020 and CT AP 06/15/2020. FINDINGS: Diminished exam detail due to respiratory motion artifact. Lower chest: No acute findings. Cardiac enlargement. There is reflux of contrast material from the right heart into the IVC and right hepatic veins seen on the arterial phase images suggesting passive venous congestion secondary to right heart failure. Hepatobiliary: No suspicious focal liver abnormality identified. Stones are identified within the gallbladder as seen on previous imaging. These measure up to 5 mm. Upper limits of normal gallbladder wall thickening measuring up to 3 mm. No intrahepatic or common bile duct dilatation. No signs of choledocholithiasis. Pancreas: No mass, inflammatory changes, or other parenchymal abnormality identified. Spleen:  Within normal limits in size and appearance. Adrenals/Urinary Tract: Bilateral adrenal nodules are identified which exhibit loss of signal on out of phase sequences compatible with benign adenomas. No kidney mass or hydronephrosis identified. Stomach/Bowel: Visualized portions within the abdomen are unremarkable. Vascular/Lymphatic: No pathologically enlarged lymph nodes identified. Aortic atherosclerosis. No abdominal aortic aneurysm demonstrated. Other:  None. Musculoskeletal: No suspicious bone lesions identified. IMPRESSION: 1. Diminished exam detail due to respiratory motion artifact. 2. Gallstones as seen on previous imaging. Upper limits of normal gallbladder wall thickening. No intrahepatic or common bile duct dilatation. No signs of  choledocholithiasis. 3. Bilateral adrenal adenomas. Electronically Signed   By: TKerby MoorsM.D.   On: 06/16/2020 10:56   [place] SITE RITE  Result Date: 06/16/2020 If Site Rite image not attached, placement could not be confirmed due to current cardiac rhythm.  [place]orea[place]domen Limited RUQ  Result Date: 06/15/2020 CLINICAL DATA:  Epigastric abdomen pain since yesterday. EXAM: ULTRASOUND ABDOMEN LIMITED RIGHT UPPER QUADRANT COMPARISON:  CT abdomen and pelvis June 15, 2020 FINDINGS: Gallbladder: Gallstones are noted the gallbladder. There is no pericholecystic fluid. Gallbladder wall measures 2.7 mm. No sonographic Murphy sign noted by sonographer. Common bile duct: Diameter: 3.6 mm Liver: No focal lesion identified. Within normal limits in parenchymal echogenicity. Portal vein is patent on color Doppler imaging with normal direction of blood flow towards the liver. Other: None. IMPRESSION: Cholelithiasis without sonographic evidence of acute cholecystitis. Electronically Signed   By: WAbelardo DieselM.D.   On: 06/15/2020 11:36     Assessment & Plan    1. Acute on Chronic Combined Systolic and Diastolic CHF - She has a known reduced EF of 40 to 45% by most recent echocardiogram in 04/2020.  Weight has been well controlled at home but she received IVF at the time of admission in the setting of sepsis and weight is now 15-20+ pounds above baseline of 200-205 lbs. She was just started on IV Lasix 60 mg twice daily yesterday and reports a good urinary output with this and has already experienced improvement in her symptoms. Would continue with IV Lasix 60 mg twice daily and follow I's and O's along with daily weights. Repeat BMET in AM given mild uptrend in creatinine (1.02 --> 1.07) but she did receive PO Torsemide and IV Lasix  on 10/11.  - She was on Toprol-XL 50 mg daily and Losartan 25 mg daily as an outpatient due to her cardiomyopathy but both have been held given hypotension. Will restart Toprol-XL  at a lower dose of 25 mg daily and titrate as BP allows.   2. CAD - She is s/p CABG in 2007 who is s/p STEMI in 10/2015 with DES to dSVG-PDA with repeat cath in 03/2018 showing patent LIMA-LAD and occluded SVG-RCA and RCA territory supplied by collaterals from LAD and LCx. - No recent chest pain. Breathing continues to improve with diuresis.  - Will restart BB. Statin currently held given elevated LFT's. Not on ASA given the need for Coumadin as an outpatient.   3. Chronic hypoxic respiratory failure - She is s/p trach in 2002 and on 4 L  at baseline.   4. Lupus Anticoagulant Disorder with prior DVT - She was on Coumadin as an outpatient and this was held at the time of admission in case she required surgical intervention.  5. Sepsis in the setting of E.coli Bacteriemia and concern for cholecystitis - She met sepsis criteria on admission and alk phos was elevated to 232 with AST 485 and ALT 163.  She is being followed by GI and General Surgery with plans for medical management at this time given improvement in her symptoms with antibiotics.   For questions or updates, please contact [facility] Please consult [email or web address] for contact info under Cardiology/STEMI.  Signed, Erma Heritage, PA-C 06/17/2020, 11:13 AM Pager: [phone]   Attending note:  Patient seen and examined.  I reviewed the case with Ms. Ahmed Prima PA-C and agree with her above findings.  Ms. Rorabaugh is currently admitted to the hospital with sepsis in the setting of E. coli bacteremia and suspected cholecystitis that is being managed medically at this time given clinical improvement on antibiotics.  She is followed by gastroenterology and has also been seen by the surgical team.  In the setting of treatment for above she also received a significant amount of fluid resuscitation and her weight has climbed to 15 to 20 pounds above baseline.  She has a known cardiomyopathy with LVEF 40 to 45% and evidence of fluid  overload.  At baseline she is on Demadex as an outpatient and her Toprol-XL as well as losartan doses were held with initial low blood pressures.  On examination today she is in no distress.  Blood pressure 120s to 130s, heart rate in the 80s.  Lungs exhibit decreased breath sounds at the bases.  Cardiac exam with RRR and no gallop.  Abdomen is protuberant and she does have 1-2+ lower leg edema.  Pertinent lab work includes potassium 3.3, BUN 25, creatinine 1.07, AST 67, ALT 127, WBC 15.9, hemoglobin 12.0, INR 1.5, hemoglobin A1c 8.9%.  I personally reviewed her ECG from 06/16/2020 which shows sinus rhythm with poor R wave progression.  Agree with diuresis, currently on IV Lasix 60 mg twice daily.  We are resuming low-dose Toprol-XL 25 mg daily and can uptitrate as blood pressure stabilizes.  Still off losartan at this point.  Continue Coumadin per pharmacy with history of lupus anticoagulant and previous DVT.  Satira Sark, M.D., F.A.C.C.

## 2020-06-17 NOTE — Progress Notes (Signed)
ANTICOAGULATION CONSULT NOTE -   Pharmacy Consult for Coumadin Indication: Lupus and h/o VTE  Allergies  Allergen Reactions   Penicillins Rash    Has patient had a PCN reaction causing immediate rash, facial/tongue/throat swelling, SOB or lightheadedness with hypotension: Yes Has patient had a PCN reaction causing severe rash involving mucus membranes or skin necrosis: No Has patient had a PCN reaction that required hospitalization No Has patient had a PCN reaction occurring within the last 10 years: No If all of the above answers are "NO", then may proceed with Cephalosporin use.   REACTION: rash Pt has tolerated cefepime in the past.   Ciprofloxacin     nausea   Ibuprofen Rash    swelling in leg     Patient Measurements: Height: 4\' 10"  (147.3 cm) Weight: 100.3 kg (221 lb 1.9 oz) IBW/kg (Calculated) : 40.9  Vital Signs: Temp: 97.9 F (36.6 C) (10/12 0550) BP: 127/76 (10/12 0550) Pulse Rate: 85 (10/12 0550)  Labs: Recent Labs    06/15/20 0014 06/15/20 0014 06/16/20 0712 06/17/20 0556  HGB 12.2   < > 11.3* 12.0  HCT 38.0  --  35.0* 38.0  PLT 247  --  214 291  APTT 44*  --  51*  --   LABPROT 22.1*  --  21.7* 17.4*  INR 2.0*  --  2.0* 1.5*  HEPARINUNFRC  --   --   --  <0.10*  CREATININE 0.90  --  1.02* 1.07*   < > = values in this interval not displayed.    Estimated Creatinine Clearance: 61.4 mL/min (A) (by C-G formula based on SCr of 1.07 mg/dL (H)).   Medical History: Past Medical History:  Diagnosis Date   Acute kidney failure with lesion of tubular necrosis (HCC)    Acute systolic heart failure (HCC)    CAD (coronary artery disease)    a. s/p prior PCI. b. CABG 2007 at [facility] in [place] 2007. c. inferior STEMI 10/2015 s/p DES to dSVG-PDA.   Cardiac arrest [facility])    Cervical cancer (HCC)    Chronic diastolic CHF (congestive heart failure) (HCC)    Chronic respiratory failure (HCC)    s/p tracheostomy 2002   Chronic RUQ pain     COPD (chronic obstructive pulmonary disease) (HCC)    Diabetes mellitus ([place])    DVT (deep venous thrombosis) ([place])    Endometriosis    History of gallstones 01/2016   seen on Ultrasound   History of HIDA scan 11/2016   normal   HTN (hypertension)    Hyperlipidemia    Lupus anticoagulant disorder (HCC)    on coumadin   Morbid obesity (HCC)    Psoriasis    ST elevation (STEMI) myocardial infarction involving right coronary artery ([place]) 10/29/15   stent to VG to PDA   Toe fracture, right 03/29/2018   Tracheostomy in place [facility]), chronic since 2002 11/03/2015   Tracheostomy status (HCC)     Medications:  Medications Prior to Admission  Medication Sig Dispense Refill Last Dose   albuterol (PROVENTIL) (2.5 MG/3ML) 0.083% nebulizer solution Take 3 mLs (2.5 mg total) by nebulization 2 (two) times daily. (Patient taking differently: Take 2.5 mg by nebulization every 4 (four) hours as needed for wheezing. ) 75 mL 12 Past Week at Unknown time   albuterol (VENTOLIN HFA) 108 (90 Base) MCG/ACT inhaler Inhale 2 puffs into the lungs every 4 (four) hours as needed for wheezing or shortness of breath.   Past Week at  Unknown time   atorvastatin (LIPITOR) 80 MG tablet Take 1 tablet (80 mg total) by mouth daily. 90 tablet 1 06/14/2020 at Unknown time   gabapentin (NEURONTIN) 300 MG capsule Take 1 capsule (300mg ) in the morning, 1 capsule (300mg ) in the afternoon, and 2 capsules (600mg ) at bedtime (Patient taking differently: Take 900 mg by mouth at bedtime. ) 120 capsule 2 06/13/2020 at Unknown time   insulin degludec (TRESIBA FLEXTOUCH) 100 UNIT/ML FlexTouch Pen Inject 50 Units into the skin at bedtime.    06/13/2020 at Unknown time   LORazepam (ATIVAN) 0.5 MG tablet Take 0.5 mg by mouth every 6 (six) hours as needed for anxiety.    Past Week at Unknown time   losartan (COZAAR) 25 MG tablet Take 1 tablet (25 mg total) by mouth daily. 30 tablet 2 06/14/2020 at Unknown time   metoprolol  succinate (TOPROL-XL) 50 MG 24 hr tablet Take 1 tablet (50 mg total) by mouth daily. 90 tablet 3 06/14/2020 at 0800-0900   nitroGLYCERIN (NITROSTAT) 0.4 MG SL tablet Place 0.4 mg under the tongue every 5 (five) minutes as needed for chest pain.   Past Month at Unknown time   NOVOLOG FLEXPEN 100 UNIT/ML FlexPen Inject 1-10 Units into the skin 3 (three) times daily before meals.   06/14/2020 at Unknown time   OZEMPIC, 0.25 OR 0.5 MG/DOSE, 2 MG/1.5ML SOPN Inject 0.5 mg into the skin every Wednesday.    06/11/2020   pantoprazole (PROTONIX) 20 MG tablet Take 20 mg by mouth daily.   06/14/2020 at Unknown time   potassium chloride SA (KLOR-CON) 20 MEQ tablet Take 20 mEq by mouth daily.   06/14/2020 at Unknown time   sertraline (ZOLOFT) 50 MG tablet Take 50 mg by mouth at bedtime.    06/13/2020 at Unknown time   tamsulosin (FLOMAX) 0.4 MG CAPS capsule Take 0.4 mg by mouth daily.   06/14/2020 at Unknown time   torsemide (DEMADEX) 20 MG tablet Take 2 tablets (40 mg total) by mouth 2 (two) times daily. 60 tablet 3 06/14/2020 at Unknown time   traMADol (ULTRAM) 50 MG tablet Take 50 mg by mouth 3 (three) times daily as needed for moderate pain.    Past Month at Unknown time   warfarin (COUMADIN) 2.5 MG tablet Take 2 tablets (5 mg) tonight at 6 PM.  Go to your PCP for INR tomorrow. (Patient taking differently: Take 2.5-5 mg by mouth See admin instructions. Mon,Wed,Fri take 5mg   2.5mg  all other days)   06/14/2020 at 0800-0900    Assessment: 54 yo female on chronic warfarin for lupus anti-coagulant syndrome and history of VTE.  Patient has  history of CAD and STEMI. No longer plan to do surgery so will restart coumadin. INR 1.5 today Patient took her last dose of warfarin 2.5mg  on 06/13/20  Home dose of warfarin:  5mg  on Mon-Wed-Fri   and   2.5mg  on Tues/Thurs/Sat/Sun  Goal of Therapy:  INR 2-3 Monitor platelets by anticoagulation protocol: Yes   Plan:  Coumadin 5mg  today x 1 PT-INR daily Monitor for S/S  of bleeding  Isac Sarna, BS [place], BCPS Clinical Pharmacist Pager [phone] 06/17/2020,12:59 PM

## 2020-06-18 DIAGNOSIS — E785 Hyperlipidemia, unspecified: Secondary | ICD-10-CM

## 2020-06-18 DIAGNOSIS — E1165 Type 2 diabetes mellitus with hyperglycemia: Secondary | ICD-10-CM

## 2020-06-18 DIAGNOSIS — R1011 Right upper quadrant pain: Secondary | ICD-10-CM

## 2020-06-18 DIAGNOSIS — E876 Hypokalemia: Secondary | ICD-10-CM

## 2020-06-18 DIAGNOSIS — R6520 Severe sepsis without septic shock: Secondary | ICD-10-CM

## 2020-06-18 DIAGNOSIS — R7401 Elevation of levels of liver transaminase levels: Secondary | ICD-10-CM

## 2020-06-18 DIAGNOSIS — K8050 Calculus of bile duct without cholangitis or cholecystitis without obstruction: Secondary | ICD-10-CM

## 2020-06-18 DIAGNOSIS — K8000 Calculus of gallbladder with acute cholecystitis without obstruction: Secondary | ICD-10-CM

## 2020-06-18 DIAGNOSIS — I5043 Acute on chronic combined systolic (congestive) and diastolic (congestive) heart failure: Secondary | ICD-10-CM | POA: Diagnosis not present

## 2020-06-18 DIAGNOSIS — R0689 Other abnormalities of breathing: Secondary | ICD-10-CM

## 2020-06-18 DIAGNOSIS — J441 Chronic obstructive pulmonary disease with (acute) exacerbation: Secondary | ICD-10-CM

## 2020-06-18 LAB — COMPREHENSIVE METABOLIC PANEL
ALT: 84 U/L — ABNORMAL HIGH (ref 0–44)
AST: 33 U/L (ref 15–41)
Albumin: 2.7 g/dL — ABNORMAL LOW (ref 3.5–5.0)
Alkaline Phosphatase: 197 U/L — ABNORMAL HIGH (ref 38–126)
Anion gap: 11 (ref 5–15)
BUN: 23 mg/dL — ABNORMAL HIGH (ref 6–20)
CO2: 40 mmol/L — ABNORMAL HIGH (ref 22–32)
Calcium: 8.7 mg/dL — ABNORMAL LOW (ref 8.9–10.3)
Chloride: 91 mmol/L — ABNORMAL LOW (ref 98–111)
Creatinine, Ser: 0.96 mg/dL (ref 0.44–1.00)
GFR, Estimated: >60 mL/min (ref >60)
Glucose, Bld: 73 mg/dL (ref 70–99)
Potassium: 3.7 mmol/L (ref 3.5–5.1)
Sodium: 142 mmol/L (ref 135–145)
Total Bilirubin: 1.6 mg/dL — ABNORMAL HIGH (ref 0.3–1.2)
Total Protein: 6.4 g/dL — ABNORMAL LOW (ref 6.5–8.1)

## 2020-06-18 LAB — CBC
HCT: 36.2 % (ref 36.0–46.0)
Hemoglobin: 11.7 g/dL — ABNORMAL LOW (ref 12.0–15.0)
MCH: 31 pg (ref 26.0–34.0)
MCHC: 32.3 g/dL (ref 30.0–36.0)
MCV: 95.8 fL (ref 80.0–100.0)
Platelets: 277 10*3/uL (ref 150–400)
RBC: 3.78 MIL/uL — ABNORMAL LOW (ref 3.87–5.11)
RDW: 16.1 % — ABNORMAL HIGH (ref 11.5–15.5)
WBC: 10.1 10*3/uL (ref 4.0–10.5)
nRBC: 0 % (ref 0.0–0.2)

## 2020-06-18 LAB — MAGNESIUM: Magnesium: 1.9 mg/dL (ref 1.7–2.4)

## 2020-06-18 LAB — GLUCOSE, CAPILLARY
Glucose-Capillary: 75 mg/dL (ref 70–99)
Glucose-Capillary: 145 mg/dL — ABNORMAL HIGH (ref 70–99)
Glucose-Capillary: 388 mg/dL — ABNORMAL HIGH (ref 70–99)
Glucose-Capillary: 401 mg/dL — ABNORMAL HIGH (ref 70–99)

## 2020-06-18 LAB — PROTIME-INR
INR: 1.6 — ABNORMAL HIGH (ref 0.8–1.2)
Prothrombin Time: 18.8 seconds — ABNORMAL HIGH (ref 11.4–15.2)

## 2020-06-18 MED ORDER — WARFARIN SODIUM 7.5 MG PO TABS
7.5000 mg | ORAL_TABLET | Freq: Once | ORAL | Status: AC
  Administered 2020-06-18: 7.5 mg via ORAL
  Filled 2020-06-18: qty 1

## 2020-06-18 MED ORDER — POTASSIUM CHLORIDE CRYS ER 20 MEQ PO TBCR
30.0000 meq | EXTENDED_RELEASE_TABLET | Freq: Once | ORAL | Status: AC
  Administered 2020-06-18: 30 meq via ORAL
  Filled 2020-06-18: qty 1

## 2020-06-18 MED ORDER — PANTOPRAZOLE SODIUM 40 MG PO TBEC
40.0000 mg | DELAYED_RELEASE_TABLET | Freq: Two times a day (BID) | ORAL | Status: DC
  Administered 2020-06-18 – 2020-06-20 (×5): 40 mg via ORAL
  Filled 2020-06-18 (×5): qty 1

## 2020-06-18 MED ORDER — WARFARIN SODIUM 5 MG PO TABS: 5.0000 mg | ORAL_TABLET | Freq: Once | ORAL | Status: DC

## 2020-06-18 NOTE — Progress Notes (Signed)
Subjective: Patient is hungry and wants her diet advanced.  She denies any abdominal pain.  Objective: Vital signs in last 24 hours: Temp:  [97.5 F (36.4 C)-98.6 F (37 C)] 98.2 F (36.8 C) (10/13 0455) Pulse Rate:  [74-89] 74 (10/13 0454) Resp:  [18-20] 18 (10/13 0454) BP: (113-136)/(64-78) 113/64 (10/13 0454) SpO2:  [94 %-99 %] 99 % (10/13 0749) Weight:  [93 kg] 93 kg (10/13 0454) Last BM Date: 06/16/20  Intake/Output from previous day: 10/12 0701 - 10/13 0700 In: 1576.3 [P.O.:1200; I.V.:176.3; IV Piggyback:200] Out: 500 [Urine:500] Intake/Output this shift: No intake/output data recorded.  General appearance: alert, cooperative and no distress GI: soft, non-tender; bowel sounds normal; no masses,  no organomegaly  Lab Results:  Recent Labs    06/17/20 0556 06/18/20 0636  WBC 15.9* 10.1  HGB 12.0 11.7*  HCT 38.0 36.2  PLT 291 277   BMET Recent Labs    06/17/20 0556 06/18/20 0636  NA 141 142  K 3.3* 3.7  CL 90* 91*  CO2 34* 40*  GLUCOSE 192* 73  BUN 25* 23*  CREATININE 1.07* 0.96  CALCIUM 8.6* 8.7*   PT/INR Recent Labs    06/17/20 0556 06/18/20 0636  LABPROT 17.4* 18.8*  INR 1.5* 1.6*    Studies/Results: [place] EKG SITE RITE  Result Date: 06/16/2020 If Site Rite image not attached, placement could not be confirmed due to current cardiac rhythm.   Anti-infectives: Anti-infectives (From admission, onward)   Start     Dose/Rate Route Frequency Ordered Stop   06/17/20 0900  cefTRIAXone (ROCEPHIN) 2 g in sodium chloride 0.9 % 100 mL IVPB        2 g 200 mL/hr over 30 Minutes Intravenous Every 24 hours 06/17/20 0819     06/15/20 1100  metroNIDAZOLE (FLAGYL) IVPB 500 mg        500 mg 100 mL/hr over 60 Minutes Intravenous Every 8 hours 06/15/20 0713 06/22/20 0559   06/15/20 1000  aztreonam (AZACTAM) 1 g in sodium chloride 0.9 % 100 mL IVPB  Status:  Discontinued        1 g 200 mL/hr over 30 Minutes Intravenous Every 8 hours 06/15/20 0723 06/15/20  0753   06/15/20 1000  ceFEPIme (MAXIPIME) 2 g in sodium chloride 0.9 % 100 mL IVPB  Status:  Discontinued        2 g 200 mL/hr over 30 Minutes Intravenous Every 8 hours 06/15/20 0754 06/17/20 0819   06/15/20 0130  aztreonam (AZACTAM) 2 g in sodium chloride 0.9 % 100 mL IVPB        2 g 200 mL/hr over 30 Minutes Intravenous  Once 06/15/20 0127 06/15/20 0258   06/15/20 0130  metroNIDAZOLE (FLAGYL) IVPB 500 mg        500 mg 100 mL/hr over 60 Minutes Intravenous  Once 06/15/20 0127 06/15/20 0358      Assessment/Plan: Impression: Acute cholecystitis, cholelithiasis, cholangitis.  Resolving.  Patient does not need any acute surgical intervention at this time.  I did tell the patient that she is not a good surgery candidate for cholecystectomy.  Should she have another episode, cholecystostomy tube would be warranted.  She reiterated that she had already seen a surgeon in [place] several years ago who told her she was not a surgical candidate.  We will advance her diet to heart healthy, carb modified diet.  Suggest a 10 to 14-day course of antibiotics to cover for bacteremia.  Will follow peripherally with you.  LOS: 3 days  Aviva Signs 06/18/2020

## 2020-06-18 NOTE — Progress Notes (Signed)
PROGRESS NOTE  Theresa [name] BVA:[id number] DOB: 02-10-1966 DOA: 06/14/2020 PCP: No primary care provider on file.   Brief History:  54 year old female with systolic and diastolic CHF, COPD, continued tobacco abuse, diabetes mellitus type 2, lupus anticoagulant syndrome, coronary artery disease with history of STEMI, hypertension, chronic respiratory failure on 4 L, chronic tracheostomy presenting with 1 day history of abdominal pain with associated nausea and vomiting. The patient began having nausea and nonbloody vomiting after eating a corn dog on 06/14/2020. The patient denies any diarrhea, fevers, chills, headache. She has had some shortness of breath over the last few days. Unfortunately, she continues to smoke 1/2 pack/day. She denies any hemoptysis. She denies any worsening orthopnea or lower extremity edema. In the emergency department, the patient was febrile up to 100.7 F with soft blood pressures. Systolic blood pressures were in the 90s to low 100s. Oxygen saturation was 94% on 5 L. CT of the abdomen and pelvis showed cholelithiasis without pericholecystic inflammation. There was also interlobular septal thickening and groundglass opacities concerning for pulmonary edema. The patient was started on cefepime and metronidazole. General surgery was consulted to assist with management.  Assessment/Plan: Severe sepsis -due to Ecoli bacteremia -Concerned about cholecystitis -Present on admission -Presented with fever, low blood pressure, and leukocytosis -Lactic acid peaked 2.8 -Judicious IV fluids initially-->stopped -d/c cefepime -Continue cetriaxone and metronidazole  Symptomatic cholelithiasis -Right upper quadrant ultrasound--Cholelithiasis without sonographic evidence of acute cholecystitis -06/15/2020 CT abdomen as discussed above -MRCP--no choledocholithiasis -General surgery consulted-->continue nonoperative management--discussed with Dr.  Arnoldo Morale -not a good surgical candidate -GI consult appreciated -LFTs trending down, clinically improving  COPD exacerbation -Started Pulmicort -Continue duo nebs -Initially on IV steroids, changed to prednisone taper  Acute on chronic respiratory failure with hypoxia -Secondary to COPD exacerbation in the setting of OHS/OSA - oxygen requirement improved 10L>>>4L -Chronically on 4 L nasal cannula -Personally reviewed chest x-ray--increased interstitial markings, small bilateral pleural effusions  Acute on Chronic systolic and diastolic CHF -11/18/3886 echo EF 40-45%, grade 3 DD -continue IV lasix 60 mg bid, hopeful to transition to oral diuretics tomorrow -Holding losartan secondary to soft blood pressure -Holding metoprolol succinate secondary to soft blood pressure initially-->restarted at lower dose -consult cardiology--appreciated  Lupus anticoagulant syndrome -Holding Coumadin in anticipation for possible surgery -restart coumadin as there are no plans for surgery  Coronary artery disease -No chest pain presently  Uncontrolled diabetes mellitus type 2 with hyperglycemia -Increase Lantus to 25 units -NovoLog sliding scale -10/11-hemoglobin A1c--8.9 -holding Ozempic  Depression -Continue sertraline and home dose lorazepam  Hypokalemia -replete -check mag--1.7    Status is: Inpatient  Remains inpatient appropriate because:IV treatments appropriate due to intensity of illness or inability to take PO   Dispo: The patient is from:Home Anticipated d/c is LN:[facility] Anticipated d/c date is: 1 days Patient currently is not medically stable to d/c.        Family Communication:noFamily at bedside  Consultants:General surgery, cardiology, gastroenterology  Code Status: FULL   DVT Prophylaxis:coumadin   Procedures: As Listed in Progress Note Above  Antibiotics: Cefepime  10/10>>10/12 Ceftriaxone 10/12>>     Subjective: She says she is feeling better.  Shortness of breath improving.  Abdominal pain has improved.  Objective: Vitals:   06/18/20 1114 06/18/20 1416 06/18/20 1435 06/18/20 1900  BP: (!) 144/85  113/72   Pulse: 88  79   Resp: 18  20   Temp:   98.1 F (36.7 C)  TempSrc:   Oral   SpO2: 98% 96% 94%   Weight:    95.9 kg  Height:        Intake/Output Summary (Last 24 hours) at 06/18/2020 1956 Last data filed at 06/18/2020 0400 Gross per 24 hour  Intake 376.28 ml  Output --  Net 376.28 ml   Weight change:  Exam:  General exam: Alert, awake, oriented x 3 Respiratory system: Clear to auscultation. Respiratory effort normal.  Trach collar in place Cardiovascular system:RRR. No murmurs, rubs, gallops. Gastrointestinal system: Abdomen is nondistended, soft and nontender. No organomegaly or masses felt. Normal bowel sounds heard. Central nervous system: Alert and oriented. No focal neurological deficits. Extremities: No C/C/E, +pedal pulses Skin: No rashes, lesions or ulcers  Psychiatry: Judgement and insight appear normal. Mood & affect appropriate.   Data Reviewed: I have personally reviewed following labs and imaging studies Basic Metabolic Panel: Recent Labs  Lab 06/15/20 0014 06/16/20 0712 06/17/20 0556 06/18/20 0636  NA 138 137 141 142  K 2.8* 3.8 3.3* 3.7  CL 88* 93* 90* 91*  CO2 36* 33* 34* 40*  GLUCOSE 232* 370* 192* 73  BUN 13 22* 25* 23*  CREATININE 0.90 1.02* 1.07* 0.96  CALCIUM 8.5* 8.3* 8.6* 8.7*  MG  --  1.7  --  1.9  PHOS  --  3.4  --   --    Liver Function Tests: Recent Labs  Lab 06/15/20 0014 06/16/20 0712 06/17/20 0556 06/18/20 0636  AST 485* 127* 67* 33  ALT 163* 147* 127* 84*  ALKPHOS 232* 227* 214* 197*  BILITOT 3.0* 5.3* 2.4* 1.6*  PROT 6.9 6.9 7.2 6.4*  ALBUMIN 3.0* 2.7* 3.0* 2.7*   Recent Labs  Lab 06/15/20 0014  LIPASE 25   No results for input(s): AMMONIA in the last 168  hours. Coagulation Profile: Recent Labs  Lab 06/15/20 0014 06/16/20 0712 06/17/20 0556 06/18/20 0636  INR 2.0* 2.0* 1.5* 1.6*   CBC: Recent Labs  Lab 06/15/20 0014 06/16/20 0712 06/17/20 0556 06/18/20 0636  WBC 14.8* 10.1 15.9* 10.1  NEUTROABS 13.6*  --   --   --   HGB 12.2 11.3* 12.0 11.7*  HCT 38.0 35.0* 38.0 36.2  MCV 93.1 95.1 95.2 95.8  PLT 247 214 291 277   Cardiac Enzymes: No results for input(s): CKTOTAL, CKMB, CKMBINDEX, TROPONINI in the last 168 hours. BNP: Invalid input(s): POCBNP CBG: Recent Labs  Lab 06/17/20 1647 06/17/20 2133 06/18/20 0803 06/18/20 1128 06/18/20 1756  GLUCAP 273* 219* 75 145* 388*   HbA1C: Recent Labs    06/16/20 0712  HGBA1C 8.9*   Urine analysis:    Component Value Date/Time   COLORURINE AMBER (A) 06/15/2020 0119   APPEARANCEUR CLEAR 06/15/2020 0119   LABSPEC 1.014 06/15/2020 0119   PHURINE 6.0 06/15/2020 0119   GLUCOSEU NEGATIVE 06/15/2020 0119   HGBUR NEGATIVE 06/15/2020 0119   BILIRUBINUR SMALL (A) 06/15/2020 0119   KETONESUR NEGATIVE 06/15/2020 0119   PROTEINUR 30 (A) 06/15/2020 0119   NITRITE NEGATIVE 06/15/2020 0119   LEUKOCYTESUR NEGATIVE 06/15/2020 0119   Sepsis Labs: @LABRCNTIP (procalcitonin:4,lacticidven:4) ) Recent Results (from the past 240 hour(s))  Respiratory Panel by RT PCR (Flu A&B, Covid) - Nasopharyngeal Swab     Status: None   Collection Time: 06/15/20 12:31 AM   Specimen: Nasopharyngeal Swab  Result Value Ref Range Status   SARS Coronavirus 2 by RT PCR NEGATIVE NEGATIVE Final    Comment: (NOTE) SARS-CoV-2 target nucleic acids are NOT DETECTED.  The SARS-CoV-2  RNA is generally detectable in upper respiratoy specimens during the acute phase of infection. The lowest concentration of SARS-CoV-2 viral copies this assay can detect is 131 copies/mL. A negative result does not preclude SARS-Cov-2 infection and should not be used as the sole basis for treatment or other patient management  decisions. A negative result may occur with  improper specimen collection/handling, submission of specimen other than nasopharyngeal swab, presence of viral mutation(s) within the areas targeted by this assay, and inadequate number of viral copies (<131 copies/mL). A negative result must be combined with clinical observations, patient history, and epidemiological information. The expected result is Negative.  Fact Sheet for Patients:  PinkCheek.be  Fact Sheet for Healthcare Providers:  GravelBags.it  This test is no t yet approved or cleared by the [place] FDA and  has been authorized for detection and/or diagnosis of SARS-CoV-2 by FDA under an Emergency Use Authorization (EUA). This EUA will remain  in effect (meaning this test can be used) for the duration of the COVID-19 declaration under Section 564(b)(1) of the Act, 21 U.S.C. section 360bbb-3(b)(1), unless the authorization is terminated or revoked sooner.     Influenza A by PCR NEGATIVE NEGATIVE Final   Influenza B by PCR NEGATIVE NEGATIVE Final    Comment: (NOTE) The Xpert Xpress SARS-CoV-2/FLU/RSV assay is intended as an aid in  the diagnosis of influenza from Nasopharyngeal swab specimens and  should not be used as a sole basis for treatment. Nasal washings and  aspirates are unacceptable for Xpert Xpress SARS-CoV-2/FLU/RSV  testing.  Fact Sheet for Patients: PinkCheek.be  Fact Sheet for Healthcare Providers: GravelBags.it  This test is not yet approved or cleared by the [place] FDA and  has been authorized for detection and/or diagnosis of SARS-CoV-2 by  FDA under an Emergency Use Authorization (EUA). This EUA will remain  in effect (meaning this test can be used) for the duration of the  Covid-19 declaration under Section 564(b)(1) of the Act, 21  U.S.C. section 360bbb-3(b)(1), unless the  authorization is  terminated or revoked. Performed at [facility], [address], [place], [place] [address]   Urine culture     Status: None   Collection Time: 06/15/20  1:19 AM   Specimen: In/Out Cath Urine  Result Value Ref Range Status   Specimen Description   Final    IN/OUT CATH URINE Performed at [facility], [address] [place], [place] [address]    Special Requests   Final    NONE Performed at [facility], [address], [place], [place] [address]    Culture   Final    NO GROWTH Performed at [facility], [place] [address], [place], [place] [address]    Report Status 06/16/2020 FINAL  Final  Blood culture (routine x 2)     Status: Abnormal   Collection Time: 06/15/20  1:38 AM   Specimen: BLOOD RIGHT HAND  Result Value Ref Range Status   Specimen Description   Final    BLOOD RIGHT HAND Performed at [facility], [address] [place], [place] [address]    Special Requests   Final    BOTTLES DRAWN AEROBIC AND ANAEROBIC Blood Culture adequate volume Performed at [facility], [address], [place],  [address]    Culture  Setup Time   Final    GRAM NEGATIVE RODS ANAEROBIC BOTTLE ONLY Gram Stain Report Called to,Read Back By and Verified With: ANNE TUTTLE,RN 5784 10/10/2021KAY GRAM NEGATIVE RODS AEROBIC BOTTLE  ONLY Gram Stain Report Called to,Read Back By and Verified With: ANNE TUTTLE,RN @1449  06/15/2020 KAY CRITICAL VALUE NOTED.  VALUE IS CONSISTENT WITH PREVIOUSLY REPORTED AND CALLED VALUE.    Culture (A)  Final    KLEBSIELLA PNEUMONIAE SUSCEPTIBILITIES PERFORMED ON PREVIOUS CULTURE WITHIN THE LAST 5 DAYS. Performed at [facility], [place] [address] [place], [place] [address]    Report Status 06/17/2020 FINAL  Final  Blood Culture (routine x 2)     Status: Abnormal   Collection Time: 06/15/20  1:50 AM   Specimen: BLOOD LEFT ARM  Result Value Ref Range Status   Specimen Description   Final    BLOOD LEFT ARM Performed at [facility], [address] [place], [place] [address]    Special Requests   Final    BOTTLES DRAWN AEROBIC AND ANAEROBIC Blood Culture adequate volume Performed at [facility], [address] [place], [place] [address]    Culture  Setup Time   Final    GRAM NEGATIVE RODS ANAEROBIC BOTTLE ONLY Gram Stain Report Called to,Read Back By and Verified With: ANNE TUTTLE,RN  @1449  06/15/2020 KAY GRAM NEGATIVE RODS AEROBIC BOTTLE ONLY Gram Stain Report Called to,Read Back By and Verified With: ANNE TUTTLE,RN @1449  06/15/2020 KAY CRITICAL RESULT CALLED TO, READ BACK BY AND VERIFIED WITH: PHARMD K MEYER [id number] AT 1210 BY CM Performed at [facility], [place] [address], [place], [place] [address]    Culture KLEBSIELLA PNEUMONIAE (A)  Final   Report Status 06/17/2020 FINAL  Final   Organism ID, Bacteria KLEBSIELLA PNEUMONIAE  Final      Susceptibility   Klebsiella pneumoniae - MIC*    AMPICILLIN RESISTANT Resistant     CEFAZOLIN <=4 SENSITIVE Sensitive     CEFEPIME <=0.12 SENSITIVE Sensitive     CEFTAZIDIME <=1 SENSITIVE Sensitive     CEFTRIAXONE <=0.25 SENSITIVE Sensitive     CIPROFLOXACIN <=0.25 SENSITIVE Sensitive     GENTAMICIN <=1 SENSITIVE Sensitive     IMIPENEM 0.5 SENSITIVE Sensitive     TRIMETH/SULFA <=20 SENSITIVE Sensitive     AMPICILLIN/SULBACTAM 4 SENSITIVE Sensitive     PIP/TAZO <=4 SENSITIVE Sensitive     * KLEBSIELLA PNEUMONIAE     Scheduled Meds: . budesonide (PULMICORT) nebulizer solution  0.5 mg Nebulization BID  . furosemide  60 mg Intravenous BID  . influenza vac split quadrivalent PF  0.5 mL Intramuscular Tomorrow-1000  . insulin aspart  0-15 Units Subcutaneous TID WC  . insulin aspart  0-5 Units Subcutaneous QHS  . insulin glargine  25 Units Subcutaneous Daily  . ipratropium-albuterol  3 mL Nebulization TID  . metoprolol succinate  25 mg Oral Daily  . pantoprazole  40 mg Oral BID  . polyethylene glycol  17 g Oral Daily  . predniSONE  50 mg Oral Q breakfast  .  Warfarin - Pharmacist Dosing Inpatient   Does not apply q1600   Continuous Infusions: . sodium chloride 500 mL (06/16/20 1451)  . cefTRIAXone (ROCEPHIN)  IV 2 g (06/18/20 1107)  . metronidazole 500 mg (06/18/20 1314)    Procedures/Studies: CT ABDOMEN PELVIS W CONTRAST  Result Date: 06/15/2020 CLINICAL DATA:  Acute abdominal pain, nonlocalized.  Cholelithiasis. EXAM: CT ABDOMEN AND PELVIS WITH CONTRAST TECHNIQUE: Multidetector CT imaging of the abdomen and pelvis was performed using the standard protocol following bolus administration of intravenous contrast. CONTRAST:  14mL OMNIPAQUE IOHEXOL 300 MG/ML  SOLN COMPARISON:  04/23/2019 FINDINGS: Lower chest: The visualized lung bases demonstrates interlobular septal  thickening and ground-glass pulmonary infiltrate likely representing pulmonary edema, possibly cardiogenic in nature. This appears improved since prior CT examination of 04/18/2020. Coronary artery stenting has been performed. Cardiac size is mildly enlarged. No pericardial effusion. Hepatobiliary: Cholelithiasis. No pericholecystic inflammatory change identified. Liver unremarkable. No intra or extrahepatic biliary ductal dilation. Pancreas: Unremarkable Spleen: Unremarkable Adrenals/Urinary Tract: Multiple bilateral adrenal nodules are unchanged measuring up to 2.5 cm within the left adrenal gland and compatible with multiple adenoma, better evaluated on prior examination. Kidneys are unremarkable. Bladder is unremarkable. Stomach/Bowel: Stomach, small bowel, and large bowel are unremarkable. Appendix normal. No free intraperitoneal gas or fluid. Vascular/Lymphatic: Extensive aortoiliac atherosclerotic calcification is present. No aortic aneurysm. No pathologic adenopathy within the abdomen and pelvis. Reproductive: Uterus and bilateral adnexa are unremarkable. Other: Rectum unremarkable Musculoskeletal: No acute bone abnormality. Remote compression fracture of T9 is unchanged. Superior  endplate fracture of L3 is unchanged. IMPRESSION: Bibasilar pulmonary infiltrates and septal thickening in keeping with mild to moderate pulmonary edema, possibly cardiogenic in nature. This appears improved. Cholelithiasis without CT evidence of acute cholecystitis. Multiple stable adrenal adenoma. Biochemical correlation may be helpful to assess for hyperfunctioning. Aortic Atherosclerosis (ICD10-I70.0). Electronically Signed   By: Fidela Salisbury MD   On: 06/15/2020 04:03   MR 3D Recon At Scanner  Result Date: 06/16/2020 CLINICAL DATA:  Evaluate for gallstones.  Elevated bilirubin levels. EXAM: MRI ABDOMEN WITHOUT AND WITH CONTRAST (INCLUDING MRCP) TECHNIQUE: Multiplanar multisequence MR imaging of the abdomen was performed both before and after the administration of intravenous contrast. Heavily T2-weighted images of the biliary and pancreatic ducts were obtained, and three-dimensional MRCP images were rendered by post processing. CONTRAST:  25mL GADAVIST GADOBUTROL 1 MMOL/ML IV SOLN COMPARISON:  Right upper quadrant sonogram 06/15/2020 and CT AP 06/15/2020. FINDINGS: Diminished exam detail due to respiratory motion artifact. Lower chest: No acute findings. Cardiac enlargement. There is reflux of contrast material from the right heart into the IVC and right hepatic veins seen on the arterial phase images suggesting passive venous congestion secondary to right heart failure. Hepatobiliary: No suspicious focal liver abnormality identified. Stones are identified within the gallbladder as seen on previous imaging. These measure up to 5 mm. Upper limits of normal gallbladder wall thickening measuring up to 3 mm. No intrahepatic or common bile duct dilatation. No signs of choledocholithiasis. Pancreas: No mass, inflammatory changes, or other parenchymal abnormality identified. Spleen:  Within normal limits in size and appearance. Adrenals/Urinary Tract: Bilateral adrenal nodules are identified which exhibit loss  of signal on out of phase sequences compatible with benign adenomas. No kidney mass or hydronephrosis identified. Stomach/Bowel: Visualized portions within the abdomen are unremarkable. Vascular/Lymphatic: No pathologically enlarged lymph nodes identified. Aortic atherosclerosis. No abdominal aortic aneurysm demonstrated. Other:  None. Musculoskeletal: No suspicious bone lesions identified. IMPRESSION: 1. Diminished exam detail due to respiratory motion artifact. 2. Gallstones as seen on previous imaging. Upper limits of normal gallbladder wall thickening. No intrahepatic or common bile duct dilatation. No signs of choledocholithiasis. 3. Bilateral adrenal adenomas. Electronically Signed   By: Kerby Moors M.D.   On: 06/16/2020 10:56   DG Chest Port 1 View  Result Date: 06/15/2020 CLINICAL DATA:  Shortness of breath EXAM: PORTABLE CHEST 1 VIEW COMPARISON:  04/17/2020 FINDINGS: The tracheostomy tube terminates above the carina. The patient is status post prior median sternotomy. The heart size is enlarged. Coarse hazy bilateral airspace opacities are noted. These have improved since the prior study. There is architectural distortion and areas of scarring, similar to prior  study. There is blunting of the costophrenic angles bilaterally similar to prior study. There is no acute osseous abnormality. IMPRESSION: Cardiomegaly with chronic bilateral airspace opacities. No definite acute cardiopulmonary process. Electronically Signed   By: Constance Holster M.D.   On: 06/15/2020 01:02   MR ABDOMEN MRCP W WO CONTAST  Result Date: 06/16/2020 CLINICAL DATA:  Evaluate for gallstones.  Elevated bilirubin levels. EXAM: MRI ABDOMEN WITHOUT AND WITH CONTRAST (INCLUDING MRCP) TECHNIQUE: Multiplanar multisequence MR imaging of the abdomen was performed both before and after the administration of intravenous contrast. Heavily T2-weighted images of the biliary and pancreatic ducts were obtained, and three-dimensional MRCP  images were rendered by post processing. CONTRAST:  12mL GADAVIST GADOBUTROL 1 MMOL/ML IV SOLN COMPARISON:  Right upper quadrant sonogram 06/15/2020 and CT AP 06/15/2020. FINDINGS: Diminished exam detail due to respiratory motion artifact. Lower chest: No acute findings. Cardiac enlargement. There is reflux of contrast material from the right heart into the IVC and right hepatic veins seen on the arterial phase images suggesting passive venous congestion secondary to right heart failure. Hepatobiliary: No suspicious focal liver abnormality identified. Stones are identified within the gallbladder as seen on previous imaging. These measure up to 5 mm. Upper limits of normal gallbladder wall thickening measuring up to 3 mm. No intrahepatic or common bile duct dilatation. No signs of choledocholithiasis. Pancreas: No mass, inflammatory changes, or other parenchymal abnormality identified. Spleen:  Within normal limits in size and appearance. Adrenals/Urinary Tract: Bilateral adrenal nodules are identified which exhibit loss of signal on out of phase sequences compatible with benign adenomas. No kidney mass or hydronephrosis identified. Stomach/Bowel: Visualized portions within the abdomen are unremarkable. Vascular/Lymphatic: No pathologically enlarged lymph nodes identified. Aortic atherosclerosis. No abdominal aortic aneurysm demonstrated. Other:  None. Musculoskeletal: No suspicious bone lesions identified. IMPRESSION: 1. Diminished exam detail due to respiratory motion artifact. 2. Gallstones as seen on previous imaging. Upper limits of normal gallbladder wall thickening. No intrahepatic or common bile duct dilatation. No signs of choledocholithiasis. 3. Bilateral adrenal adenomas. Electronically Signed   By: Kerby Moors M.D.   On: 06/16/2020 10:56   [place] EKG SITE RITE  Result Date: 06/16/2020 If Site Rite image not attached, placement could not be confirmed due to current cardiac rhythm.  US Abdomen Limited  RUQ  Result Date: 06/15/2020 CLINICAL DATA:  Epigastric abdomen pain since yesterday. EXAM: ULTRASOUND ABDOMEN LIMITED RIGHT UPPER QUADRANT COMPARISON:  CT abdomen and pelvis June 15, 2020 FINDINGS: Gallbladder: Gallstones are noted the gallbladder. There is no pericholecystic fluid. Gallbladder wall measures 2.7 mm. No sonographic Murphy sign noted by sonographer. Common bile duct: Diameter: 3.6 mm Liver: No focal lesion identified. Within normal limits in parenchymal echogenicity. Portal vein is patent on color Doppler imaging with normal direction of blood flow towards the liver. Other: None. IMPRESSION: Cholelithiasis without sonographic evidence of acute cholecystitis. Electronically Signed   By: Abelardo Diesel M.D.   On: 06/15/2020 11:36    Kathie Dike, MD  [facility]  If 7PM-7AM, please contact night-coverage [email or web address]  06/18/2020, 7:56 PM   LOS: 3 days

## 2020-06-18 NOTE — Progress Notes (Signed)
GI Inpatient Follow-up Note  Patient Identification: Theresa [name] is a 54 y.o. female with PMHx of CHF, DVTs on Coumadin, COPD, diabetes, lupus anticoagulant syndrome, CAD, history of STEMI and CABG, chronic respiratory failure, chronic trachsince 2002) admitted w/ sepsis and abd pain w/ concern for cholangitis. Elevated LFTS but no CBD or intrahepatic dilation on MRCP 06/16/20.   Subjective:  Overall some improvement overnight, still having some abdominal pain but feels it is better than yesterday.  Worst pain is in right upper quadrant but does still have the left lower quadrant pain that "bothers her".  She not have a bowel movement today.  She tolerated cream of chicken soup yesterday but did not like her breakfast and has not eaten anything yet today.     Scheduled Inpatient Medications:  . budesonide (PULMICORT) nebulizer solution  0.5 mg Nebulization BID  . furosemide  60 mg Intravenous BID  . influenza vac split quadrivalent PF  0.5 mL Intramuscular Tomorrow-1000  . insulin aspart  0-15 Units Subcutaneous TID WC  . insulin aspart  0-5 Units Subcutaneous QHS  . insulin glargine  25 Units Subcutaneous Daily  . ipratropium-albuterol  3 mL Nebulization TID  . metoprolol succinate  25 mg Oral Daily  . pantoprazole  40 mg Oral BID  . polyethylene glycol  17 g Oral Daily  . predniSONE  50 mg Oral Q breakfast  . warfarin  7.5 mg Oral ONCE-1600  . Warfarin - Pharmacist Dosing Inpatient   Does not apply q1600    Continuous Inpatient Infusions:   . sodium chloride 500 mL (06/16/20 1451)  . cefTRIAXone (ROCEPHIN)  IV 2 g (06/18/20 1107)  . metronidazole 500 mg (06/18/20 0454)    PRN Inpatient Medications:  sodium chloride, albuterol, traMADol  Review of Systems: Constitutional: Weight is stable.  Eyes: No changes in vision. ENT: No oral lesions, sore throat.  GI: see HPI.  Heme/Lymph: No easy bruising.  CV: No chest pain.  GU: No hematuria.  Integumentary: No rashes.    Neuro: No headaches.  Psych: No depression/anxiety.  Endocrine: No heat/cold intolerance.  Allergic/Immunologic: No urticaria.  Resp: No cough, SOB.  Musculoskeletal: No joint swelling.    Physical Examination: BP (!) 144/85 (BP Location: Right Arm)   Pulse 88   Temp 98.2 F (36.8 C)   Resp 18   Ht 4\' 10"  (1.473 m)   Wt 93 kg   SpO2 98%   BMI 42.85 kg/m  Gen: NAD, alert and oriented x 4, obese, upright in chair, + trach HEENT: PEERLA, EOMI, Neck: supple, no JVD or thyromegaly Chest: CTA bilaterally, no wheezes, crackles, or other adventitious sounds CV: RRR, no m/g/c/r Abd: soft, TTP diffusely but most significant RUQ, ND, +BS in all four quadrants; no HSM, guarding, ridigity, or rebound tenderness Ext: no edema, well perfused with 2+ pulses, Skin: no rash or lesions noted Lymph: no LAD  Data: Lab Results  Component Value Date   WBC 10.1 06/18/2020   HGB 11.7 (L) 06/18/2020   HCT 36.2 06/18/2020   MCV 95.8 06/18/2020   PLT 277 06/18/2020   Recent Labs  Lab 06/16/20 0712 06/17/20 0556 06/18/20 0636  HGB 11.3* 12.0 11.7*   Lab Results  Component Value Date   NA 142 06/18/2020   K 3.7 06/18/2020   CL 91 (L) 06/18/2020   CO2 40 (H) 06/18/2020   BUN 23 (H) 06/18/2020   CREATININE 0.96 06/18/2020   Lab Results  Component Value Date   ALT  84 (H) 06/18/2020   AST 33 06/18/2020   ALKPHOS 197 (H) 06/18/2020   BILITOT 1.6 (H) 06/18/2020   Recent Labs  Lab 06/16/20 0712 06/17/20 0556 06/18/20 0636  APTT 51*  --   --   INR 2.0*   < > 1.6*   < > = values in this interval not displayed.   Assessment/Plan: Ms. Sweetland is a 54 y.o. female followed for elevated LFTS w/ sepsis on admission. Currently on ABX fo klebsiellia pneumoniae.   1. Cholelithiasis - CBD 3.87mm on [place]. MRCP without choledocholithiasis or CBD dilation. LFTS trending down since admission (Bili 5.3-->1.6 this AM). Had bump in WBC to 15.9 yesterday Am but normal this AM. Known hx of cholelithiasis  but felt poor surgical candidate given co morbidities, plan is cholecystotomy tube w/ reoccurances.  Clinically her right upper quadrant pain is improving, she is advancing her diet appropriately without worsening pain  2.  Left lower quadrant pain-may be related to mild constipation-she is receiving MiraLAX.  CT on admission without diverticulitis and pain was present on admission. Continue bowel regimen.     Case discussed with Dr. Laural Golden who will also see patient. Surgery following. GI sign off for now, please call w/ questions or concerns.    Ronney Asters, PA-C [facility]

## 2020-06-18 NOTE — Progress Notes (Addendum)
Progress Note  Patient Name: Theresa [name] Date of Encounter: 06/18/2020  Primary Cardiologist: [name], MD   Subjective   Breathing improved. Says she still feels weak. Has been experiencing intermittent episodes of diaphoresis since admission but reports chills. Afebrile for the past 24 hours. No recent chest pain or palpitations.   Inpatient Medications    Scheduled Meds: . budesonide (PULMICORT) nebulizer solution  0.5 mg Nebulization BID  . furosemide  60 mg Intravenous BID  . influenza vac split quadrivalent PF  0.5 mL Intramuscular Tomorrow-1000  . insulin aspart  0-15 Units Subcutaneous TID WC  . insulin aspart  0-5 Units Subcutaneous QHS  . insulin glargine  25 Units Subcutaneous Daily  . ipratropium-albuterol  3 mL Nebulization TID  . metoprolol succinate  25 mg Oral Daily  . pantoprazole (PROTONIX) IV  40 mg Intravenous Q12H  . polyethylene glycol  17 g Oral Daily  . potassium chloride  30 mEq Oral Once  . predniSONE  50 mg Oral Q breakfast  . Warfarin - Pharmacist Dosing Inpatient   Does not apply q1600   Continuous Infusions: . sodium chloride 500 mL (06/16/20 1451)  . cefTRIAXone (ROCEPHIN)  IV 2 g (06/17/20 1129)  . metronidazole 500 mg (06/18/20 0454)   PRN Meds: sodium chloride, albuterol, traMADol   Vital Signs    Vitals:   06/18/20 0454 06/18/20 0455 06/18/20 0744 06/18/20 0749  BP: 113/64     Pulse: 74     Resp: 18     Temp: (!) 97.5 F (36.4 C) 98.2 F (36.8 C)    TempSrc:      SpO2: 99%  99% 99%  Weight: 93 kg     Height:        Intake/Output Summary (Last 24 hours) at 06/18/2020 0823 Last data filed at 06/18/2020 0400 Gross per 24 hour  Intake 1576.28 ml  Output 500 ml  Net 1076.28 ml    Last 3 Weights 06/18/2020 06/16/2020 06/14/2020  Weight (lbs) 205 lb 0.4 oz 221 lb 1.9 oz 220 lb  Weight (kg) 93 kg 100.3 kg 99.791 kg      Telemetry    NSR, HR in 60's to 70's. Episodes of ventricular bigeminy and frequent isolated  PVC's.  - Personally Reviewed  ECG    No new tracings.   Physical Exam   General: Well developed, obese female appearing in no acute distress. Head: Normocephalic, atraumatic.  Neck: Supple without bruits. Trach in place.  Lungs:  Resp regular and unlabored, CTA without wheezing or rales. Heart: RRR with occasional ectopic beats, S1, S2, no S3, S4, or murmur; no rub. Abdomen: Soft, non-tender, non-distended with normoactive bowel sounds. No hepatomegaly. No rebound/guarding. No obvious abdominal masses. Extremities: No clubbing or cyanosis, trace lower extremity edema bilaterally. Distal pedal pulses are 2+ bilaterally. Neuro: Alert and oriented X 3. Moves all extremities spontaneously. Psych: Normal affect.  Labs    Chemistry Recent Labs  Lab 06/16/20 0712 06/17/20 0556 06/18/20 0636  NA 137 141 142  K 3.8 3.3* 3.7  CL 93* 90* 91*  CO2 33* 34* 40*  GLUCOSE 370* 192* 73  BUN 22* 25* 23*  CREATININE 1.02* 1.07* 0.96  CALCIUM 8.3* 8.6* 8.7*  PROT 6.9 7.2 6.4*  ALBUMIN 2.7* 3.0* 2.7*  AST 127* 67* 33  ALT 147* 127* 84*  ALKPHOS 227* 214* 197*  BILITOT 5.3* 2.4* 1.6*  GFRNONAA >60 59* >60  ANIONGAP 11 17* 11     Hematology Recent  Labs  Lab 06/16/20 0712 06/17/20 0556 06/18/20 0636  WBC 10.1 15.9* 10.1  RBC 3.68* 3.99 3.78*  HGB 11.3* 12.0 11.7*  HCT 35.0* 38.0 36.2  MCV 95.1 95.2 95.8  MCH 30.7 30.1 31.0  MCHC 32.3 31.6 32.3  RDW 15.9* 15.9* 16.1*  PLT 214 291 277    BNP Recent Labs  Lab 06/16/20 0712  BNP 920.0*     Radiology    MR 3D Recon At Scanner  Result Date: 06/16/2020 CLINICAL DATA:  Evaluate for gallstones.  Elevated bilirubin levels. EXAM: MRI ABDOMEN WITHOUT AND WITH CONTRAST (INCLUDING MRCP) TECHNIQUE: Multiplanar multisequence MR imaging of the abdomen was performed both before and after the administration of intravenous contrast. Heavily T2-weighted images of the biliary and pancreatic ducts were obtained, and three-dimensional MRCP  images were rendered by post processing. CONTRAST:  42mL GADAVIST GADOBUTROL 1 MMOL/ML IV SOLN COMPARISON:  Right upper quadrant sonogram 06/15/2020 and CT AP 06/15/2020. FINDINGS: Diminished exam detail due to respiratory motion artifact. Lower chest: No acute findings. Cardiac enlargement. There is reflux of contrast material from the right heart into the IVC and right hepatic veins seen on the arterial phase images suggesting passive venous congestion secondary to right heart failure. Hepatobiliary: No suspicious focal liver abnormality identified. Stones are identified within the gallbladder as seen on previous imaging. These measure up to 5 mm. Upper limits of normal gallbladder wall thickening measuring up to 3 mm. No intrahepatic or common bile duct dilatation. No signs of choledocholithiasis. Pancreas: No mass, inflammatory changes, or other parenchymal abnormality identified. Spleen:  Within normal limits in size and appearance. Adrenals/Urinary Tract: Bilateral adrenal nodules are identified which exhibit loss of signal on out of phase sequences compatible with benign adenomas. No kidney mass or hydronephrosis identified. Stomach/Bowel: Visualized portions within the abdomen are unremarkable. Vascular/Lymphatic: No pathologically enlarged lymph nodes identified. Aortic atherosclerosis. No abdominal aortic aneurysm demonstrated. Other:  None. Musculoskeletal: No suspicious bone lesions identified. IMPRESSION: 1. Diminished exam detail due to respiratory motion artifact. 2. Gallstones as seen on previous imaging. Upper limits of normal gallbladder wall thickening. No intrahepatic or common bile duct dilatation. No signs of choledocholithiasis. 3. Bilateral adrenal adenomas. Electronically Signed   By: Kerby Moors M.D.   On: 06/16/2020 10:56   MR ABDOMEN MRCP W WO CONTAST  Result Date: 06/16/2020 CLINICAL DATA:  Evaluate for gallstones.  Elevated bilirubin levels. EXAM: MRI ABDOMEN WITHOUT AND WITH  CONTRAST (INCLUDING MRCP) TECHNIQUE: Multiplanar multisequence MR imaging of the abdomen was performed both before and after the administration of intravenous contrast. Heavily T2-weighted images of the biliary and pancreatic ducts were obtained, and three-dimensional MRCP images were rendered by post processing. CONTRAST:  18mL GADAVIST GADOBUTROL 1 MMOL/ML IV SOLN COMPARISON:  Right upper quadrant sonogram 06/15/2020 and CT AP 06/15/2020. FINDINGS: Diminished exam detail due to respiratory motion artifact. Lower chest: No acute findings. Cardiac enlargement. There is reflux of contrast material from the right heart into the IVC and right hepatic veins seen on the arterial phase images suggesting passive venous congestion secondary to right heart failure. Hepatobiliary: No suspicious focal liver abnormality identified. Stones are identified within the gallbladder as seen on previous imaging. These measure up to 5 mm. Upper limits of normal gallbladder wall thickening measuring up to 3 mm. No intrahepatic or common bile duct dilatation. No signs of choledocholithiasis. Pancreas: No mass, inflammatory changes, or other parenchymal abnormality identified. Spleen:  Within normal limits in size and appearance. Adrenals/Urinary Tract: Bilateral adrenal nodules are identified  which exhibit loss of signal on out of phase sequences compatible with benign adenomas. No kidney mass or hydronephrosis identified. Stomach/Bowel: Visualized portions within the abdomen are unremarkable. Vascular/Lymphatic: No pathologically enlarged lymph nodes identified. Aortic atherosclerosis. No abdominal aortic aneurysm demonstrated. Other:  None. Musculoskeletal: No suspicious bone lesions identified. IMPRESSION: 1. Diminished exam detail due to respiratory motion artifact. 2. Gallstones as seen on previous imaging. Upper limits of normal gallbladder wall thickening. No intrahepatic or common bile duct dilatation. No signs of  choledocholithiasis. 3. Bilateral adrenal adenomas. Electronically Signed   By: Kerby Moors M.D.   On: 06/16/2020 10:56   [place] EKG SITE RITE  Result Date: 06/16/2020 If Site Rite image not attached, placement could not be confirmed due to current cardiac rhythm.   Cardiac Studies   Echocardiogram: 04/2020 IMPRESSIONS    1. Left ventricular ejection fraction, by estimation, is 40 to 45%. The  left ventricle has mildly decreased function. The left ventricle  demonstrates regional wall motion abnormalities (see scoring  diagram/findings for description). Left ventricular  diastolic parameters are consistent with Grade III diastolic dysfunction  (restrictive). Elevated left atrial pressure.  2. Right ventricular systolic function is normal. The right ventricular  size is normal. There is mildly elevated pulmonary artery systolic  pressure. The estimated right ventricular systolic pressure is 96.2 mmHg.  3. Left atrial size was mildly dilated.  4. Right atrial size was mildly dilated.  5. The mitral valve is degenerative. Mild mitral valve regurgitation.  6. The aortic valve is tricuspid. Aortic valve regurgitation is not  visualized. Mild aortic valve sclerosis is present, with no evidence of  aortic valve stenosis.  7. The inferior vena cava is dilated in size with <50% respiratory  variability, suggesting right atrial pressure of 15 mmHg.   Patient Profile     54 y.o. female w/PMH of CAD (s/p CABG in 2007 who is s/p STEMI in 10/2015 with DES to dSVG-PDA, repeat cath in 03/2018 showing patent LIMA-LAD and occluded SVG-RCA and RCA territory supplied by collaterals from LAD and LCx), chronic combined systolic and diastolic CHF, chronic hypoxic respiratory failure (s/p trach in 2002 and on supplemental oxygen), lupus anticoagulant disorder with prior DVT (on Coumadin), HTN, HLD and Type 2 DM who is currently admitted for sepsis secondary to E.coli bacteremia and cholelithiasis.  Cardiology consulted for CHF.   Assessment & Plan    1. Acute on Chronic Combined Systolic and Diastolic CHF - She has a known cardiomyopathy with EF of 40 to 45% by most recent echocardiogram in 04/2020. Received IVF on admission for sepsis and weight increased from her prior baseline of 200-205 lbs to a recorded amount of 221lbs and she reported worsening dyspnea and abdominal distension.  - Currently receiving IV Lasix 60mg  BID and I&O's have not been recorded but weight listed at 205 lbs this AM. Creatinine stable at 0.96. Will ask for a standing weight to be obtained today. She still has mild volume overload so perhaps she has lost additional weight due to dietary restrictions/liquid diet this admission. Continue with IV Lasix today and possibly transition back to PO Torsemide tomorrow.  - She does have frequent ectopy on telemetry including PVC's and episodes of ventricular bigeminy. Scheduled to restart Toprol-XL today. K+ 3.7 this AM. Will try to keep ~ 4.0. Will add on Mg to AM labs. Possible resumption of Losartan prior to d/c if BP allows.   2. CAD - She is s/p CABG in 2007 who is s/p STEMI in  10/2015 with DES to dSVG-PDA with repeat cath in 03/2018 showing patent LIMA-LAD and occluded SVG-RCA and RCA territory supplied by collaterals from LAD and LCx. - No recent anginal symptoms and EKG this admission is without acute ischemic changes. Continue BB therapy. Statin remains held given elevated LFT's but these continue to improve. Not on ASA due to the need for anticoagulation.   3. Chronic Hypoxic Respiratory Failure - She is s/p trach in 2002 and on 4 L [place] at baseline.   4. Lupus Anticoagulant Disorder with prior DVT - Coumadin was initially held in case she required surgical intervention. Has now been restarted and dosing is being managed by Pharmacy.   5. Sepsis in the setting of E.coli Bacteriemia and concern for cholecystitis - Received IVF on admission after meeting sepsis  criteria. Currently being followed by GI and General Surgery with plans for medical management at this time given improvement in her symptoms with antibiotic treatment and due to being a poor surgical candidate.    For questions or updates, please contact [facility] Please consult [email or web address] for contact info under Cardiology/STEMI.   Arna Medici , PA-C 8:23 AM 06/18/2020 Pager: [phone]   Attending note:  Reviewed hospital course since rounds yesterday and discussed case with Ms. Delano Metz, I agree with her above findings.  Ms. Fedie continues to diurese on IV Lasix based on change in weight (intake and output incomplete).  She is in sinus rhythm by telemetry with heart rate in the 70s, blood pressure 113/64.  Abdomen nondistended.  Pertinent lab work includes potassium 3.7, BUN 23, creatinine 0.96, hemoglobin 11.7, INR 1.6.  Plan to continue IV diuresis today and if trend continues most likely transition back to oral Demadex tomorrow.  She is otherwise on Toprol-XL, potassium supplement, and Coumadin per pharmacy.  We will continue to follow.  Satira Sark, M.D., F.A.C.C.

## 2020-06-18 NOTE — Progress Notes (Signed)
In to admin meds, pt states that she had episode of SOB with clogged trach in past hour. States she called on call bell for assistance and person who answered the call told her they would send Respiratory but no one ever came. Pt states she used a straw to clean out her trach to remove the crusted mucous and now she feels fine. SaO2 95%, resp even and nonlabored. This nurse was not aware that patient had called for assistance. Apologized to patient for delay in care and notified RT so that they could come reevaluate.

## 2020-06-18 NOTE — Progress Notes (Addendum)
ANTICOAGULATION CONSULT NOTE -   Pharmacy Consult for Coumadin Indication: Lupus and h/o VTE  Allergies  Allergen Reactions  . Penicillins Rash    Has patient had a PCN reaction causing immediate rash, facial/tongue/throat swelling, SOB or lightheadedness with hypotension: Yes Has patient had a PCN reaction causing severe rash involving mucus membranes or skin necrosis: No Has patient had a PCN reaction that required hospitalization No Has patient had a PCN reaction occurring within the last 10 years: No If all of the above answers are "NO", then may proceed with Cephalosporin use.   REACTION: rash Pt has tolerated cefepime in the past.  . Ciprofloxacin     nausea  . Ibuprofen Rash    swelling in leg     Patient Measurements: Height: 4\' 10"  (147.3 cm) Weight: 93 kg (205 lb 0.4 oz) IBW/kg (Calculated) : 40.9  Vital Signs: Temp: 98.2 F (36.8 C) (10/13 0455) BP: 113/64 (10/13 0454) Pulse Rate: 74 (10/13 0454)  Labs: Recent Labs    06/16/20 0712 06/16/20 0712 06/17/20 0556 06/18/20 0636  HGB 11.3*   < > 12.0 11.7*  HCT 35.0*  --  38.0 36.2  PLT 214  --  291 277  APTT 51*  --   --   --   LABPROT 21.7*  --  17.4* 18.8*  INR 2.0*  --  1.5* 1.6*  HEPARINUNFRC  --   --  <0.10*  --   CREATININE 1.02*  --  1.07* 0.96   < > = values in this interval not displayed.    Estimated Creatinine Clearance: 65.3 mL/min (by C-G formula based on SCr of 0.96 mg/dL).   Medical History: Past Medical History:  Diagnosis Date  . Acute kidney failure with lesion of tubular necrosis (HCC)   . Acute systolic heart failure ([place])   . CAD (coronary artery disease)    a. s/p prior PCI. b. CABG 2007 at [facility] in [place] 2007. c. inferior STEMI 10/2015 s/p DES to dSVG-PDA.  . Cardiac arrest ([place])   . Cervical cancer ([place])   . Chronic diastolic CHF (congestive heart failure) ([place])   . Chronic respiratory failure ([place])    s/p tracheostomy 2002  . Chronic RUQ pain   . COPD  (chronic obstructive pulmonary disease) ([place])   . Diabetes mellitus ([place])   . DVT (deep venous thrombosis) ([place])   . Endometriosis   . History of gallstones 01/2016   seen on Ultrasound  . History of HIDA scan 11/2016   normal  . HTN (hypertension)   . Hyperlipidemia   . Lupus anticoagulant disorder (HCC)    on coumadin  . Morbid obesity ([place])   . Psoriasis   . ST elevation (STEMI) myocardial infarction involving right coronary artery ([place]) 10/29/15   stent to VG to PDA  . Toe fracture, right 03/29/2018  . Tracheostomy in place [facility]), chronic since 2002 11/03/2015  . Tracheostomy status ([place])     Medications:  Medications Prior to Admission  Medication Sig Dispense Refill Last Dose  . albuterol (PROVENTIL) (2.5 MG/3ML) 0.083% nebulizer solution Take 3 mLs (2.5 mg total) by nebulization 2 (two) times daily. (Patient taking differently: Take 2.5 mg by nebulization every 4 (four) hours as needed for wheezing. ) 75 mL 12 Past Week at Unknown time  . albuterol (VENTOLIN HFA) 108 (90 Base) MCG/ACT inhaler Inhale 2 puffs into the lungs every 4 (four) hours as needed for wheezing or shortness of breath.   Past Week at  Unknown time  . atorvastatin (LIPITOR) 80 MG tablet Take 1 tablet (80 mg total) by mouth daily. 90 tablet 1 06/14/2020 at Unknown time  . gabapentin (NEURONTIN) 300 MG capsule Take 1 capsule (300mg ) in the morning, 1 capsule (300mg ) in the afternoon, and 2 capsules (600mg ) at bedtime (Patient taking differently: Take 900 mg by mouth at bedtime. ) 120 capsule 2 06/13/2020 at Unknown time  . insulin degludec (TRESIBA FLEXTOUCH) 100 UNIT/ML FlexTouch Pen Inject 50 Units into the skin at bedtime.    06/13/2020 at Unknown time  . LORazepam (ATIVAN) 0.5 MG tablet Take 0.5 mg by mouth every 6 (six) hours as needed for anxiety.    Past Week at Unknown time  . losartan (COZAAR) 25 MG tablet Take 1 tablet (25 mg total) by mouth daily. 30 tablet 2 06/14/2020 at Unknown time  . metoprolol succinate  (TOPROL-XL) 50 MG 24 hr tablet Take 1 tablet (50 mg total) by mouth daily. 90 tablet 3 06/14/2020 at 0800-0900  . nitroGLYCERIN (NITROSTAT) 0.4 MG SL tablet Place 0.4 mg under the tongue every 5 (five) minutes as needed for chest pain.   Past Month at Unknown time  . NOVOLOG FLEXPEN 100 UNIT/ML FlexPen Inject 1-10 Units into the skin 3 (three) times daily before meals.   06/14/2020 at Unknown time  . OZEMPIC, 0.25 OR 0.5 MG/DOSE, 2 MG/1.5ML SOPN Inject 0.5 mg into the skin every Wednesday.    06/11/2020  . pantoprazole (PROTONIX) 20 MG tablet Take 20 mg by mouth daily.   06/14/2020 at Unknown time  . potassium chloride SA (KLOR-CON) 20 MEQ tablet Take 20 mEq by mouth daily.   06/14/2020 at Unknown time  . sertraline (ZOLOFT) 50 MG tablet Take 50 mg by mouth at bedtime.    06/13/2020 at Unknown time  . tamsulosin (FLOMAX) 0.4 MG CAPS capsule Take 0.4 mg by mouth daily.   06/14/2020 at Unknown time  . torsemide (DEMADEX) 20 MG tablet Take 2 tablets (40 mg total) by mouth 2 (two) times daily. 60 tablet 3 06/14/2020 at Unknown time  . traMADol (ULTRAM) 50 MG tablet Take 50 mg by mouth 3 (three) times daily as needed for moderate pain.    Past Month at Unknown time  . warfarin (COUMADIN) 2.5 MG tablet Take 2 tablets (5 mg) tonight at 6 PM.  Go to your PCP for INR tomorrow. (Patient taking differently: Take 2.5-5 mg by mouth See admin instructions. Mon,Wed,Fri take 5mg   2.5mg  all other days)   06/14/2020 at 0800-0900    Assessment: 54 yo female on chronic warfarin for lupus anti-coagulant syndrome and history of VTE.  Patient has  history of CAD and STEMI. No longer plan to do surgery so will restart coumadin. Patient took her last dose of warfarin 2.5mg  on 06/13/20  Home dose of warfarin:  5mg  on Mon-Wed-Fri   and   2.5mg  on Tues/Thurs/Sat/Sun  INR 1.6 today, trending upward. Will give another booster dose  Goal of Therapy:  INR 2-3 Monitor platelets by anticoagulation protocol: Yes   Plan:  Coumadin  7.5mg  today x 1 PT-INR daily Monitor for S/S of bleeding  Isac Sarna, BS [place], BCPS Clinical Pharmacist Pager [phone] 06/18/2020,9:15 AM

## 2020-06-19 DIAGNOSIS — I5043 Acute on chronic combined systolic (congestive) and diastolic (congestive) heart failure: Secondary | ICD-10-CM | POA: Diagnosis not present

## 2020-06-19 LAB — COMPREHENSIVE METABOLIC PANEL
ALT: 68 U/L — ABNORMAL HIGH (ref 0–44)
AST: 27 U/L (ref 15–41)
Albumin: 2.9 g/dL — ABNORMAL LOW (ref 3.5–5.0)
Alkaline Phosphatase: 199 U/L — ABNORMAL HIGH (ref 38–126)
Anion gap: 13 (ref 5–15)
BUN: 19 mg/dL (ref 6–20)
CO2: 39 mmol/L — ABNORMAL HIGH (ref 22–32)
Calcium: 8.7 mg/dL — ABNORMAL LOW (ref 8.9–10.3)
Chloride: 91 mmol/L — ABNORMAL LOW (ref 98–111)
Creatinine, Ser: 0.9 mg/dL (ref 0.44–1.00)
GFR, Estimated: >60 mL/min (ref >60)
Glucose, Bld: 47 mg/dL — ABNORMAL LOW (ref 70–99)
Potassium: 4 mmol/L (ref 3.5–5.1)
Sodium: 143 mmol/L (ref 135–145)
Total Bilirubin: 1.5 mg/dL — ABNORMAL HIGH (ref 0.3–1.2)
Total Protein: 6.7 g/dL (ref 6.5–8.1)

## 2020-06-19 LAB — CBC
HCT: 39.6 % (ref 36.0–46.0)
Hemoglobin: 12.4 g/dL (ref 12.0–15.0)
MCH: 30.1 pg (ref 26.0–34.0)
MCHC: 31.3 g/dL (ref 30.0–36.0)
MCV: 96.1 fL (ref 80.0–100.0)
Platelets: 293 10*3/uL (ref 150–400)
RBC: 4.12 MIL/uL (ref 3.87–5.11)
RDW: 16.1 % — ABNORMAL HIGH (ref 11.5–15.5)
WBC: 9.1 10*3/uL (ref 4.0–10.5)
nRBC: 0 % (ref 0.0–0.2)

## 2020-06-19 LAB — GLUCOSE, CAPILLARY
Glucose-Capillary: 77 mg/dL (ref 70–99)
Glucose-Capillary: 177 mg/dL — ABNORMAL HIGH (ref 70–99)
Glucose-Capillary: 275 mg/dL — ABNORMAL HIGH (ref 70–99)
Glucose-Capillary: 338 mg/dL — ABNORMAL HIGH (ref 70–99)

## 2020-06-19 LAB — PROTIME-INR
INR: 2 — ABNORMAL HIGH (ref 0.8–1.2)
Prothrombin Time: 22 seconds — ABNORMAL HIGH (ref 11.4–15.2)

## 2020-06-19 MED ORDER — GABAPENTIN 300 MG PO CAPS
600.0000 mg | ORAL_CAPSULE | Freq: Every day | ORAL | Status: DC
  Administered 2020-06-19: 600 mg via ORAL
  Filled 2020-06-19: qty 2

## 2020-06-19 MED ORDER — GABAPENTIN 300 MG PO CAPS
300.0000 mg | ORAL_CAPSULE | Freq: Two times a day (BID) | ORAL | Status: DC
  Administered 2020-06-20 (×2): 300 mg via ORAL
  Filled 2020-06-19 (×2): qty 1

## 2020-06-19 MED ORDER — PREDNISONE 20 MG PO TABS
40.0000 mg | ORAL_TABLET | Freq: Every day | ORAL | Status: DC
  Administered 2020-06-20: 40 mg via ORAL
  Filled 2020-06-19: qty 2

## 2020-06-19 MED ORDER — TEMAZEPAM 15 MG PO CAPS
15.0000 mg | ORAL_CAPSULE | Freq: Every evening | ORAL | Status: DC | PRN
  Filled 2020-06-19: qty 1

## 2020-06-19 MED ORDER — INSULIN ASPART 100 UNIT/ML ~~LOC~~ SOLN
4.0000 [IU] | Freq: Three times a day (TID) | SUBCUTANEOUS | Status: DC
  Administered 2020-06-20: 4 [IU] via SUBCUTANEOUS

## 2020-06-19 MED ORDER — IPRATROPIUM-ALBUTEROL 0.5-2.5 (3) MG/3ML IN SOLN
3.0000 mL | Freq: Two times a day (BID) | RESPIRATORY_TRACT | Status: DC
  Administered 2020-06-19 – 2020-06-20 (×2): 3 mL via RESPIRATORY_TRACT
  Filled 2020-06-19 (×2): qty 3

## 2020-06-19 MED ORDER — WARFARIN SODIUM 2.5 MG PO TABS
2.5000 mg | ORAL_TABLET | Freq: Once | ORAL | Status: AC
  Administered 2020-06-19: 2.5 mg via ORAL
  Filled 2020-06-19: qty 1

## 2020-06-19 MED ORDER — INSULIN GLARGINE 100 UNIT/ML ~~LOC~~ SOLN
20.0000 [IU] | Freq: Every day | SUBCUTANEOUS | Status: DC
  Administered 2020-06-20: 20 [IU] via SUBCUTANEOUS
  Filled 2020-06-19 (×4): qty 0.2

## 2020-06-19 NOTE — Progress Notes (Signed)
PROGRESS NOTE  Theresa [name] QVZ:[id number] DOB: September 03, 1966 DOA: 06/14/2020 PCP: No primary care provider on file.   Brief History:  54 year old female with systolic and diastolic CHF, COPD, continued tobacco abuse, diabetes mellitus type 2, lupus anticoagulant syndrome, coronary artery disease with history of STEMI, hypertension, chronic respiratory failure on 4 L, chronic tracheostomy presenting with 1 day history of abdominal pain with associated nausea and vomiting. The patient began having nausea and nonbloody vomiting after eating a corn dog on 06/14/2020. The patient denies any diarrhea, fevers, chills, headache. She has had some shortness of breath over the last few days. Unfortunately, she continues to smoke 1/2 pack/day. She denies any hemoptysis. She denies any worsening orthopnea or lower extremity edema. In the emergency department, the patient was febrile up to 100.7 F with soft blood pressures. Systolic blood pressures were in the 90s to low 100s. Oxygen saturation was 94% on 5 L. CT of the abdomen and pelvis showed cholelithiasis without pericholecystic inflammation. There was also interlobular septal thickening and groundglass opacities concerning for pulmonary edema. The patient was started on cefepime and metronidazole. General surgery was consulted to assist with management.  Assessment/Plan: Severe sepsis -due to Ecoli bacteremia -Concerned about cholecystitis -Present on admission -Presented with fever, low blood pressure, and leukocytosis -Lactic acid peaked 2.8 -Judicious IV fluids initially-->stopped -d/c cefepime -Continue cetriaxone and metronidazole  Symptomatic cholelithiasis -Right upper quadrant ultrasound--Cholelithiasis without sonographic evidence of acute cholecystitis -06/15/2020 CT abdomen as discussed above -MRCP--no choledocholithiasis -General surgery consulted-->continue nonoperative management--discussed with Dr.  Arnoldo Morale -not a good surgical candidate -GI consult appreciated -LFTs trending down, clinically improving -Restart statin  COPD exacerbation -Started Pulmicort -Continue duo nebs -Initially on IV steroids, changed to prednisone taper  Acute on chronic respiratory failure with hypoxia -Secondary to COPD exacerbation in the setting of OHS/OSA - oxygen requirement improved 10L>>>4L -Chronically on 4 L nasal cannula -Personally reviewed chest x-ray--increased interstitial markings, small bilateral pleural effusions  Acute on Chronic systolic and diastolic CHF -12/03/5186 echo EF 40-45%, grade 3 DD -continue IV lasix 60 mg bid, hopeful to transition to oral diuretics tomorrow -Holding losartan secondary to soft blood pressure -Holding metoprolol succinate secondary to soft blood pressure initially-->restarted at lower dose -consult cardiology--appreciated  Lupus anticoagulant syndrome -Holding Coumadin in anticipation for possible surgery -restart coumadin as there are no plans for surgery  Coronary artery disease -No chest pain presently  Uncontrolled diabetes mellitus type 2 with hyperglycemia -Increase Lantus to 25 units -NovoLog sliding scale -10/11-hemoglobin A1c--8.9 -holding Ozempic  Depression -Continue sertraline and home dose lorazepam  Hypokalemia -replete -check mag--1.7    Status is: Inpatient  Remains inpatient appropriate because:IV treatments appropriate due to intensity of illness or inability to take PO   Dispo: The patient is from:Home Anticipated d/c is CZ:YSAY Anticipated d/c date is: 1 days Patient currently is not medically stable to d/c.        Family Communication:noFamily at bedside  Consultants:General surgery, cardiology, gastroenterology  Code Status: FULL   DVT Prophylaxis:coumadin   Procedures: As Listed in Progress Note  Above  Antibiotics: Cefepime 10/10>>10/12 Ceftriaxone 10/12>>     Subjective: She says she is feeling better.  Shortness of breath improving.  Abdominal pain has improved.  Objective: Vitals:   06/19/20 1248 06/19/20 1420 06/19/20 1606 06/19/20 1937  BP:  120/78    Pulse: 64 76  67  Resp: 16 16  16   Temp:  98.7 F (37.1 C)  TempSrc:  Oral    SpO2: 98% 96% 96% 98%  Weight:      Height:       No intake or output data in the 24 hours ending 06/19/20 2028 Weight change: 2.936 kg Exam:  General exam: Alert, awake, oriented x 3 Respiratory system: Clear to auscultation. Respiratory effort normal.  Trach collar in place Cardiovascular system:RRR. No murmurs, rubs, gallops. Gastrointestinal system: Abdomen is nondistended, soft and nontender. No organomegaly or masses felt. Normal bowel sounds heard. Central nervous system: Alert and oriented. No focal neurological deficits. Extremities: No C/C/E, +pedal pulses Skin: No rashes, lesions or ulcers  Psychiatry: Judgement and insight appear normal. Mood & affect appropriate.   Data Reviewed: I have personally reviewed following labs and imaging studies Basic Metabolic Panel: Recent Labs  Lab 06/15/20 0014 06/16/20 0712 06/17/20 0556 06/18/20 0636 06/19/20 0622  NA 138 137 141 142 143  K 2.8* 3.8 3.3* 3.7 4.0  CL 88* 93* 90* 91* 91*  CO2 36* 33* 34* 40* 39*  GLUCOSE 232* 370* 192* 73 47*  BUN 13 22* 25* 23* 19  CREATININE 0.90 1.02* 1.07* 0.96 0.90  CALCIUM 8.5* 8.3* 8.6* 8.7* 8.7*  MG  --  1.7  --  1.9  --   PHOS  --  3.4  --   --   --    Liver Function Tests: Recent Labs  Lab 06/15/20 0014 06/16/20 0712 06/17/20 0556 06/18/20 0636 06/19/20 0622  AST 485* 127* 67* 33 27  ALT 163* 147* 127* 84* 68*  ALKPHOS 232* 227* 214* 197* 199*  BILITOT 3.0* 5.3* 2.4* 1.6* 1.5*  PROT 6.9 6.9 7.2 6.4* 6.7  ALBUMIN 3.0* 2.7* 3.0* 2.7* 2.9*   Recent Labs  Lab 06/15/20 0014  LIPASE 25   No results for input(s):  AMMONIA in the last 168 hours. Coagulation Profile: Recent Labs  Lab 06/15/20 0014 06/16/20 0712 06/17/20 0556 06/18/20 0636 06/19/20 0622  INR 2.0* 2.0* 1.5* 1.6* 2.0*   CBC: Recent Labs  Lab 06/15/20 0014 06/16/20 0712 06/17/20 0556 06/18/20 0636 06/19/20 0622  WBC 14.8* 10.1 15.9* 10.1 9.1  NEUTROABS 13.6*  --   --   --   --   HGB 12.2 11.3* 12.0 11.7* 12.4  HCT 38.0 35.0* 38.0 36.2 39.6  MCV 93.1 95.1 95.2 95.8 96.1  PLT 247 214 291 277 293   Cardiac Enzymes: No results for input(s): CKTOTAL, CKMB, CKMBINDEX, TROPONINI in the last 168 hours. BNP: Invalid input(s): POCBNP CBG: Recent Labs  Lab 06/18/20 1756 06/18/20 2059 06/19/20 0746 06/19/20 1134 06/19/20 1626  GLUCAP 388* 401* 77 177* 275*   HbA1C: No results for input(s): HGBA1C in the last 72 hours. Urine analysis:    Component Value Date/Time   COLORURINE AMBER (A) 06/15/2020 0119   APPEARANCEUR CLEAR 06/15/2020 0119   LABSPEC 1.014 06/15/2020 0119   PHURINE 6.0 06/15/2020 0119   GLUCOSEU NEGATIVE 06/15/2020 0119   HGBUR NEGATIVE 06/15/2020 0119   BILIRUBINUR SMALL (A) 06/15/2020 0119   KETONESUR NEGATIVE 06/15/2020 0119   PROTEINUR 30 (A) 06/15/2020 0119   NITRITE NEGATIVE 06/15/2020 0119   LEUKOCYTESUR NEGATIVE 06/15/2020 0119   Sepsis Labs: @LABRCNTIP (procalcitonin:4,lacticidven:4) ) Recent Results (from the past 240 hour(s))  Respiratory Panel by RT PCR (Flu A&B, Covid) - Nasopharyngeal Swab     Status: None   Collection Time: 06/15/20 12:31 AM   Specimen: Nasopharyngeal Swab  Result Value Ref Range Status   SARS Coronavirus 2 by RT PCR NEGATIVE NEGATIVE Final  Comment: (NOTE) SARS-CoV-2 target nucleic acids are NOT DETECTED.  The SARS-CoV-2 RNA is generally detectable in upper respiratoy specimens during the acute phase of infection. The lowest concentration of SARS-CoV-2 viral copies this assay can detect is 131 copies/mL. A negative result does not preclude  SARS-Cov-2 infection and should not be used as the sole basis for treatment or other patient management decisions. A negative result may occur with  improper specimen collection/handling, submission of specimen other than nasopharyngeal swab, presence of viral mutation(s) within the areas targeted by this assay, and inadequate number of viral copies (<131 copies/mL). A negative result must be combined with clinical observations, patient history, and epidemiological information. The expected result is Negative.  Fact Sheet for Patients:  PinkCheek.be  Fact Sheet for Healthcare Providers:  GravelBags.it  This test is no t yet approved or cleared by the [place] FDA and  has been authorized for detection and/or diagnosis of SARS-CoV-2 by FDA under an Emergency Use Authorization (EUA). This EUA will remain  in effect (meaning this test can be used) for the duration of the COVID-19 declaration under Section 564(b)(1) of the Act, 21 U.S.C. section 360bbb-3(b)(1), unless the authorization is terminated or revoked sooner.     Influenza A by PCR NEGATIVE NEGATIVE Final   Influenza B by PCR NEGATIVE NEGATIVE Final    Comment: (NOTE) The Xpert Xpress SARS-CoV-2/FLU/RSV assay is intended as an aid in  the diagnosis of influenza from Nasopharyngeal swab specimens and  should not be used as a sole basis for treatment. Nasal washings and  aspirates are unacceptable for Xpert Xpress SARS-CoV-2/FLU/RSV  testing.  Fact Sheet for Patients: PinkCheek.be  Fact Sheet for Healthcare Providers: GravelBags.it  This test is not yet approved or cleared by the [place] FDA and  has been authorized for detection and/or diagnosis of SARS-CoV-2 by  FDA under an Emergency Use Authorization (EUA). This EUA will remain  in effect (meaning this test can be used) for the duration of the   Covid-19 declaration under Section 564(b)(1) of the Act, 21  U.S.C. section 360bbb-3(b)(1), unless the authorization is  terminated or revoked. Performed at [facility], [address], [place], [place] [address]   Urine culture     Status: None   Collection Time: 06/15/20  1:19 AM   Specimen: In/Out Cath Urine  Result Value Ref Range Status   Specimen Description   Final    IN/OUT CATH URINE Performed at [facility], [address], [place], [place] [address]    Special Requests   Final    NONE Performed at [facility], [address], [place], [place] [address]    Culture   Final    NO GROWTH Performed at [facility], [place] [address] [place], [place] [address]    Report Status 06/16/2020 FINAL  Final  Blood culture (routine x 2)     Status: Abnormal   Collection Time: 06/15/20  1:38 AM   Specimen: BLOOD RIGHT HAND  Result Value Ref Range Status   Specimen Description   Final    BLOOD RIGHT HAND Performed at [facility], [address], [place], [place] [address]    Special Requests   Final    BOTTLES DRAWN AEROBIC AND ANAEROBIC Blood Culture adequate volume Performed at [facility], [address] [place],  [address]    Culture  Setup Time   Final    GRAM NEGATIVE RODS ANAEROBIC BOTTLE ONLY Gram Stain Report Called to,Read Back By  and Verified With: ANNE TUTTLE,RN 1449 10/10/2021KAY GRAM NEGATIVE RODS AEROBIC BOTTLE ONLY Gram Stain Report Called to,Read Back By and Verified With: ANNE TUTTLE,RN @1449  06/15/2020 KAY CRITICAL VALUE NOTED.  VALUE IS CONSISTENT WITH PREVIOUSLY REPORTED AND CALLED VALUE.    Culture (A)  Final    KLEBSIELLA PNEUMONIAE SUSCEPTIBILITIES PERFORMED ON PREVIOUS CULTURE WITHIN THE LAST 5 DAYS. Performed at [facility], [place] [address] [place], [place] [address]    Report Status 06/17/2020 FINAL  Final  Blood Culture (routine x 2)     Status: Abnormal   Collection Time: 06/15/20  1:50 AM   Specimen: BLOOD LEFT ARM   Result Value Ref Range Status   Specimen Description   Final    BLOOD LEFT ARM Performed at [facility], [address] [place], [place] [address]    Special Requests   Final    BOTTLES DRAWN AEROBIC AND ANAEROBIC Blood Culture adequate volume Performed at [facility], [address] [place], [place] [address]    Culture  Setup Time   Final    GRAM NEGATIVE RODS ANAEROBIC BOTTLE ONLY Gram Stain Report Called to,Read Back By and Verified With: ANNE TUTTLE,RN  @1449  06/15/2020 KAY GRAM NEGATIVE RODS AEROBIC BOTTLE ONLY Gram Stain Report Called to,Read Back By and Verified With: ANNE TUTTLE,RN @1449  06/15/2020 KAY CRITICAL RESULT CALLED TO, READ BACK BY AND VERIFIED WITH: PHARMD K MEYER [id number] AT 1210 BY CM Performed at [facility], [place] [address] [place], [place] [address]    Culture KLEBSIELLA PNEUMONIAE (A)  Final   Report Status 06/17/2020 FINAL  Final   Organism ID, Bacteria KLEBSIELLA PNEUMONIAE  Final      Susceptibility   Klebsiella pneumoniae - MIC*    AMPICILLIN RESISTANT Resistant     CEFAZOLIN <=4 SENSITIVE Sensitive     CEFEPIME <=0.12 SENSITIVE Sensitive     CEFTAZIDIME <=1 SENSITIVE Sensitive     CEFTRIAXONE <=0.25 SENSITIVE Sensitive     CIPROFLOXACIN <=0.25 SENSITIVE Sensitive     GENTAMICIN <=1 SENSITIVE Sensitive     IMIPENEM 0.5 SENSITIVE Sensitive     TRIMETH/SULFA <=20 SENSITIVE Sensitive     AMPICILLIN/SULBACTAM 4 SENSITIVE Sensitive     PIP/TAZO <=4 SENSITIVE Sensitive     * KLEBSIELLA PNEUMONIAE     Scheduled Meds: . budesonide (PULMICORT) nebulizer solution  0.5 mg Nebulization BID  . furosemide  60 mg Intravenous BID  . [START ON 06/20/2020] gabapentin  300 mg Oral BID WC  . gabapentin  600 mg Oral QHS  . influenza vac split quadrivalent PF  0.5 mL Intramuscular Tomorrow-1000  . insulin aspart  0-15 Units Subcutaneous TID WC  . insulin aspart  0-5 Units Subcutaneous QHS  . [START ON 06/20/2020] insulin glargine  20 Units Subcutaneous  Daily  . ipratropium-albuterol  3 mL Nebulization BID  . metoprolol succinate  25 mg Oral Daily  . pantoprazole  40 mg Oral BID  . polyethylene glycol  17 g Oral Daily  . predniSONE  50 mg Oral Q breakfast  . Warfarin - Pharmacist Dosing Inpatient   Does not apply q1600   Continuous Infusions: . sodium chloride 500 mL (06/16/20 1451)  . cefTRIAXone (ROCEPHIN)  IV 2 g (06/19/20 3382)  . metronidazole 500 mg (06/19/20 1528)    Procedures/Studies: CT ABDOMEN PELVIS W CONTRAST  Result Date: 06/15/2020 CLINICAL DATA:  Acute abdominal pain, nonlocalized.  Cholelithiasis. EXAM: CT ABDOMEN AND PELVIS WITH CONTRAST TECHNIQUE: Multidetector CT imaging of the abdomen and  pelvis was performed using the standard protocol following bolus administration of intravenous contrast. CONTRAST:  139mL OMNIPAQUE IOHEXOL 300 MG/ML  SOLN COMPARISON:  04/23/2019 FINDINGS: Lower chest: The visualized lung bases demonstrates interlobular septal thickening and ground-glass pulmonary infiltrate likely representing pulmonary edema, possibly cardiogenic in nature. This appears improved since prior CT examination of 04/18/2020. Coronary artery stenting has been performed. Cardiac size is mildly enlarged. No pericardial effusion. Hepatobiliary: Cholelithiasis. No pericholecystic inflammatory change identified. Liver unremarkable. No intra or extrahepatic biliary ductal dilation. Pancreas: Unremarkable Spleen: Unremarkable Adrenals/Urinary Tract: Multiple bilateral adrenal nodules are unchanged measuring up to 2.5 cm within the left adrenal gland and compatible with multiple adenoma, better evaluated on prior examination. Kidneys are unremarkable. Bladder is unremarkable. Stomach/Bowel: Stomach, small bowel, and large bowel are unremarkable. Appendix normal. No free intraperitoneal gas or fluid. Vascular/Lymphatic: Extensive aortoiliac atherosclerotic calcification is present. No aortic aneurysm. No pathologic adenopathy within the  abdomen and pelvis. Reproductive: Uterus and bilateral adnexa are unremarkable. Other: Rectum unremarkable Musculoskeletal: No acute bone abnormality. Remote compression fracture of T9 is unchanged. Superior endplate fracture of L3 is unchanged. IMPRESSION: Bibasilar pulmonary infiltrates and septal thickening in keeping with mild to moderate pulmonary edema, possibly cardiogenic in nature. This appears improved. Cholelithiasis without CT evidence of acute cholecystitis. Multiple stable adrenal adenoma. Biochemical correlation may be helpful to assess for hyperfunctioning. Aortic Atherosclerosis (ICD10-I70.0). Electronically Signed   By: Fidela Salisbury MD   On: 06/15/2020 04:03   MR 3D Recon At Scanner  Result Date: 06/16/2020 CLINICAL DATA:  Evaluate for gallstones.  Elevated bilirubin levels. EXAM: MRI ABDOMEN WITHOUT AND WITH CONTRAST (INCLUDING MRCP) TECHNIQUE: Multiplanar multisequence MR imaging of the abdomen was performed both before and after the administration of intravenous contrast. Heavily T2-weighted images of the biliary and pancreatic ducts were obtained, and three-dimensional MRCP images were rendered by post processing. CONTRAST:  1mL GADAVIST GADOBUTROL 1 MMOL/ML IV SOLN COMPARISON:  Right upper quadrant sonogram 06/15/2020 and CT AP 06/15/2020. FINDINGS: Diminished exam detail due to respiratory motion artifact. Lower chest: No acute findings. Cardiac enlargement. There is reflux of contrast material from the right heart into the IVC and right hepatic veins seen on the arterial phase images suggesting passive venous congestion secondary to right heart failure. Hepatobiliary: No suspicious focal liver abnormality identified. Stones are identified within the gallbladder as seen on previous imaging. These measure up to 5 mm. Upper limits of normal gallbladder wall thickening measuring up to 3 mm. No intrahepatic or common bile duct dilatation. No signs of choledocholithiasis. Pancreas: No  mass, inflammatory changes, or other parenchymal abnormality identified. Spleen:  Within normal limits in size and appearance. Adrenals/Urinary Tract: Bilateral adrenal nodules are identified which exhibit loss of signal on out of phase sequences compatible with benign adenomas. No kidney mass or hydronephrosis identified. Stomach/Bowel: Visualized portions within the abdomen are unremarkable. Vascular/Lymphatic: No pathologically enlarged lymph nodes identified. Aortic atherosclerosis. No abdominal aortic aneurysm demonstrated. Other:  None. Musculoskeletal: No suspicious bone lesions identified. IMPRESSION: 1. Diminished exam detail due to respiratory motion artifact. 2. Gallstones as seen on previous imaging. Upper limits of normal gallbladder wall thickening. No intrahepatic or common bile duct dilatation. No signs of choledocholithiasis. 3. Bilateral adrenal adenomas. Electronically Signed   By: Kerby Moors M.D.   On: 06/16/2020 10:56   DG Chest Port 1 View  Result Date: 06/15/2020 CLINICAL DATA:  Shortness of breath EXAM: PORTABLE CHEST 1 VIEW COMPARISON:  04/17/2020 FINDINGS: The tracheostomy tube terminates above the carina. The patient is  status post prior median sternotomy. The heart size is enlarged. Coarse hazy bilateral airspace opacities are noted. These have improved since the prior study. There is architectural distortion and areas of scarring, similar to prior study. There is blunting of the costophrenic angles bilaterally similar to prior study. There is no acute osseous abnormality. IMPRESSION: Cardiomegaly with chronic bilateral airspace opacities. No definite acute cardiopulmonary process. Electronically Signed   By: Constance Holster M.D.   On: 06/15/2020 01:02   MR ABDOMEN MRCP W WO CONTAST  Result Date: 06/16/2020 CLINICAL DATA:  Evaluate for gallstones.  Elevated bilirubin levels. EXAM: MRI ABDOMEN WITHOUT AND WITH CONTRAST (INCLUDING MRCP) TECHNIQUE: Multiplanar multisequence  MR imaging of the abdomen was performed both before and after the administration of intravenous contrast. Heavily T2-weighted images of the biliary and pancreatic ducts were obtained, and three-dimensional MRCP images were rendered by post processing. CONTRAST:  53mL GADAVIST GADOBUTROL 1 MMOL/ML IV SOLN COMPARISON:  Right upper quadrant sonogram 06/15/2020 and CT AP 06/15/2020. FINDINGS: Diminished exam detail due to respiratory motion artifact. Lower chest: No acute findings. Cardiac enlargement. There is reflux of contrast material from the right heart into the IVC and right hepatic veins seen on the arterial phase images suggesting passive venous congestion secondary to right heart failure. Hepatobiliary: No suspicious focal liver abnormality identified. Stones are identified within the gallbladder as seen on previous imaging. These measure up to 5 mm. Upper limits of normal gallbladder wall thickening measuring up to 3 mm. No intrahepatic or common bile duct dilatation. No signs of choledocholithiasis. Pancreas: No mass, inflammatory changes, or other parenchymal abnormality identified. Spleen:  Within normal limits in size and appearance. Adrenals/Urinary Tract: Bilateral adrenal nodules are identified which exhibit loss of signal on out of phase sequences compatible with benign adenomas. No kidney mass or hydronephrosis identified. Stomach/Bowel: Visualized portions within the abdomen are unremarkable. Vascular/Lymphatic: No pathologically enlarged lymph nodes identified. Aortic atherosclerosis. No abdominal aortic aneurysm demonstrated. Other:  None. Musculoskeletal: No suspicious bone lesions identified. IMPRESSION: 1. Diminished exam detail due to respiratory motion artifact. 2. Gallstones as seen on previous imaging. Upper limits of normal gallbladder wall thickening. No intrahepatic or common bile duct dilatation. No signs of choledocholithiasis. 3. Bilateral adrenal adenomas. Electronically Signed   By:  Kerby Moors M.D.   On: 06/16/2020 10:56   [place] EKG SITE RITE  Result Date: 06/16/2020 If Site Rite image not attached, placement could not be confirmed due to current cardiac rhythm.  US Abdomen Limited RUQ  Result Date: 06/15/2020 CLINICAL DATA:  Epigastric abdomen pain since yesterday. EXAM: ULTRASOUND ABDOMEN LIMITED RIGHT UPPER QUADRANT COMPARISON:  CT abdomen and pelvis June 15, 2020 FINDINGS: Gallbladder: Gallstones are noted the gallbladder. There is no pericholecystic fluid. Gallbladder wall measures 2.7 mm. No sonographic Murphy sign noted by sonographer. Common bile duct: Diameter: 3.6 mm Liver: No focal lesion identified. Within normal limits in parenchymal echogenicity. Portal vein is patent on color Doppler imaging with normal direction of blood flow towards the liver. Other: None. IMPRESSION: Cholelithiasis without sonographic evidence of acute cholecystitis. Electronically Signed   By: Abelardo Diesel M.D.   On: 06/15/2020 11:36    Kathie Dike, MD  [facility]  If 7PM-7AM, please contact night-coverage [email or web address]  06/19/2020, 8:28 PM   LOS: 4 days

## 2020-06-19 NOTE — Progress Notes (Signed)
Chaplain engaged in initial visit with Theresa [name] and her husband [name].  Chaplain explained her role and offered support.  Bernard shared that she wanted to relax.    Chaplain can follow-up as needed.

## 2020-06-19 NOTE — Progress Notes (Signed)
Progress Note  Patient Name: Theresa [name] Date of Encounter: 06/19/2020  Primary Cardiologist: [name], MD  Subjective   No breathlessness at rest.  Intermittent abdominal soreness, much improved however.  No chest pain or palpitations.  Inpatient Medications    Scheduled Meds: . budesonide (PULMICORT) nebulizer solution  0.5 mg Nebulization BID  . furosemide  60 mg Intravenous BID  . influenza vac split quadrivalent PF  0.5 mL Intramuscular Tomorrow-1000  . insulin aspart  0-15 Units Subcutaneous TID WC  . insulin aspart  0-5 Units Subcutaneous QHS  . [START ON 06/20/2020] insulin glargine  20 Units Subcutaneous Daily  . ipratropium-albuterol  3 mL Nebulization BID  . metoprolol succinate  25 mg Oral Daily  . pantoprazole  40 mg Oral BID  . polyethylene glycol  17 g Oral Daily  . predniSONE  50 mg Oral Q breakfast  . warfarin  2.5 mg Oral ONCE-1600  . Warfarin - Pharmacist Dosing Inpatient   Does not apply q1600   Continuous Infusions: . sodium chloride 500 mL (06/16/20 1451)  . cefTRIAXone (ROCEPHIN)  IV 2 g (06/19/20 7026)  . metronidazole 500 mg (06/19/20 3785)   PRN Meds: sodium chloride, albuterol, temazepam, traMADol   Vital Signs    Vitals:   06/18/20 2334 06/19/20 0342 06/19/20 0629 06/19/20 0900  BP:   119/65   Pulse: 74 72 67   Resp: 18 18 18    Temp:   97.7 F (36.5 C)   TempSrc:   Oral   SpO2: 95% 98% 100% 99%  Weight:   96.9 kg   Height:        Intake/Output Summary (Last 24 hours) at 06/19/2020 1119 Last data filed at 06/18/2020 1830 Gross per 24 hour  Intake 600 ml  Output 350 ml  Net 250 ml   Filed Weights   06/18/20 0454 06/18/20 1900 06/19/20 0629  Weight: 93 kg 95.9 kg 96.9 kg    Telemetry    Normal sinus rhythm.  Personally reviewed.  ECG    No interval tracing for review today.  Physical Exam   GEN: No acute distress.   Neck:  Tracheostomy in place. Cardiac: RRR, no gallop.  Respiratory: Nonlabored. Clear  to auscultation bilaterally. GI:  Obese, nontender. MS:  Mild ankle edema; No deformity. Neuro:  Nonfocal.  Labs    Chemistry Recent Labs  Lab 06/17/20 0556 06/18/20 0636 06/19/20 0622  NA 141 142 143  K 3.3* 3.7 4.0  CL 90* 91* 91*  CO2 34* 40* 39*  GLUCOSE 192* 73 47*  BUN 25* 23* 19  CREATININE 1.07* 0.96 0.90  CALCIUM 8.6* 8.7* 8.7*  PROT 7.2 6.4* 6.7  ALBUMIN 3.0* 2.7* 2.9*  AST 67* 33 27  ALT 127* 84* 68*  ALKPHOS 214* 197* 199*  BILITOT 2.4* 1.6* 1.5*  GFRNONAA 59* >60 >60  ANIONGAP 17* 11 13     Hematology Recent Labs  Lab 06/17/20 0556 06/18/20 0636 06/19/20 0622  WBC 15.9* 10.1 9.1  RBC 3.99 3.78* 4.12  HGB 12.0 11.7* 12.4  HCT 38.0 36.2 39.6  MCV 95.2 95.8 96.1  MCH 30.1 31.0 30.1  MCHC 31.6 32.3 31.3  RDW 15.9* 16.1* 16.1*  PLT 291 277 293    BNP Recent Labs  Lab 06/16/20 0712  BNP 920.0*     Radiology    No results found.  Cardiac Studies   Echocardiogram 04/20/2020: 1. Left ventricular ejection fraction, by estimation, is 40 to 45%. The  left  ventricle has mildly decreased function. The left ventricle  demonstrates regional wall motion abnormalities (see scoring  diagram/findings for description). Left ventricular  diastolic parameters are consistent with Grade III diastolic dysfunction  (restrictive). Elevated left atrial pressure.  2. Right ventricular systolic function is normal. The right ventricular  size is normal. There is mildly elevated pulmonary artery systolic  pressure. The estimated right ventricular systolic pressure is 70.2 mmHg.  3. Left atrial size was mildly dilated.  4. Right atrial size was mildly dilated.  5. The mitral valve is degenerative. Mild mitral valve regurgitation.  6. The aortic valve is tricuspid. Aortic valve regurgitation is not  visualized. Mild aortic valve sclerosis is present, with no evidence of  aortic valve stenosis.  7. The inferior vena cava is dilated in size with <50%  respiratory  variability, suggesting right atrial pressure of 15 mmHg.   Patient Profile     54 y.o. female with a history of CAD (s/p CABG in 2007who is s/p STEMI in 10/2015 with DES to dSVG-PDA, repeat cath in 03/2018 showing patent LIMA-LAD and occluded SVG-RCA and RCA territory supplied by collaterals from LAD and LCx), chronic combined systolic and diastolic CHF, chronic hypoxic respiratory failure (s/p trach in 2002and on supplemental oxygen), lupus anticoagulant disorder with prior DVT (on Coumadin), HTN, HLD and Type 2 DM whois currently admitted for sepsis secondary to E.coli bacteremia and cholelithiasis. Cardiology consulted for CHF.   Assessment & Plan    1.  Acute on chronic combined heart failure, LVEF 40 to 45%.  If weights are accurate, she is down about 10 pounds and still not back to baseline.  Intake and output not complete.  She continues on IV Lasix.  Otherwise on Toprol-XL.  ARB not yet resumed.  2.  CAD status post CABG in 2007, DES to the SVG to PDA in 2017, and graft disease by angiography in 2019 with evidence of patent LIMA to the LAD and occluded SVG to RCA with RCA filling via collaterals from the left coronary system.  No active angina at this time.  Statin held with recent abnormal LFTs, but these have been improving.  3.  Chronic hypoxic respiratory failure status post trach in 2002.  She is on 4 L oxygen at baseline.  4.  Lupus anticoagulant with history of DVT, on chronic Coumadin.  5.  Sepsis with E. coli bacteremia and cholecystitis.  Not felt to be a good surgical candidate.  Managing medically on the hospitalist team.  Would continue IV diuresis for now to get her closer to baseline. Depending on blood pressure may then be able to add low-dose ARB and transition back to oral Demadex.  Renal function has been stable. LFTs also improving and when back to baseline, she can likely resume statin therapy.  Signed, Rozann Lesches, MD  06/19/2020, 11:19 AM

## 2020-06-19 NOTE — Progress Notes (Addendum)
Inpatient Diabetes Program Recommendations  AACE/ADA: New Consensus Statement on Inpatient Glycemic Control   Target Ranges:  Prepandial:   less than 140 mg/dL      Peak postprandial:   less than 180 mg/dL (1-2 hours)      Critically ill patients:  140 - 180 mg/dL  Results for MEIGAN, PATES (MRN [id number]) as of 06/19/2020 10:50  Ref. Range 06/19/2020 07:46  Glucose-Capillary Latest Ref Range: 70 - 99 mg/dL 77   Results for CHARLISA, CHAM (MRN [id number]) as of 06/19/2020 10:50  Ref. Range 06/19/2020 06:22  Glucose Latest Ref Range: 70 - 99 mg/dL 47 (L)   Results for JOANNAH, GITLIN (MRN [id number]) as of 06/19/2020 10:50  Ref. Range 06/18/2020 08:03 06/18/2020 11:28 06/18/2020 17:56 06/18/2020 20:59  Glucose-Capillary Latest Ref Range: 70 - 99 mg/dL 75 145 (H)  Novolog 2 units  Lantus 25 units 388 (H)  Novolog 15 units 401 (H)  Novolog 5 units    Review of Glycemic Control  Diabetes history: DM2 Outpatient Diabetes medications: Tresiba 50 units QHS, Novolog 1-10 units TID with meals, Ozempic 0.5 mg Qweek Current orders for Inpatient glycemic control: Lantus 25 units daily, Novolog 0-15 units TID with meals, Novolog 0-5 units QHS; Prednisone 50 mg QAM   Inpatient Diabetes Program Recommendations:    Insulin: Please consider decreasing Lantus to 20 units daily. If steroids are continued as ordered, please consider ordering Novolog 4 units TID with meals for meal coverage if patient eats at least 50% of meals.  Thanks, Barnie Alderman, RN, MSN, CDE Diabetes Coordinator Inpatient Diabetes Program [phone] (Team Pager from 8am to 5pm)

## 2020-06-19 NOTE — Progress Notes (Signed)
ANTICOAGULATION CONSULT NOTE -   Pharmacy Consult for Coumadin Indication: Lupus and h/o VTE  Allergies  Allergen Reactions  . Penicillins Rash    Has patient had a PCN reaction causing immediate rash, facial/tongue/throat swelling, SOB or lightheadedness with hypotension: Yes Has patient had a PCN reaction causing severe rash involving mucus membranes or skin necrosis: No Has patient had a PCN reaction that required hospitalization No Has patient had a PCN reaction occurring within the last 10 years: No If all of the above answers are "NO", then may proceed with Cephalosporin use.   REACTION: rash Pt has tolerated cefepime in the past.  . Ciprofloxacin     nausea  . Ibuprofen Rash    swelling in leg     Patient Measurements: Height: 4\' 10"  (147.3 cm) Weight: 96.9 kg (213 lb 10 oz) IBW/kg (Calculated) : 40.9  Vital Signs: Temp: 97.7 F (36.5 C) (10/14 0629) Temp Source: Oral (10/14 0629) BP: 119/65 (10/14 0629) Pulse Rate: 67 (10/14 0629)  Labs: Recent Labs    06/17/20 0556 06/17/20 0556 06/18/20 0636 06/19/20 0622  HGB 12.0   < > 11.7* 12.4  HCT 38.0  --  36.2 39.6  PLT 291  --  277 293  LABPROT 17.4*  --  18.8* 22.0*  INR 1.5*  --  1.6* 2.0*  HEPARINUNFRC <0.10*  --   --   --   CREATININE 1.07*  --  0.96 0.90   < > = values in this interval not displayed.    Estimated Creatinine Clearance: 71.4 mL/min (by C-G formula based on SCr of 0.9 mg/dL).   Medical History: Past Medical History:  Diagnosis Date  . Acute kidney failure with lesion of tubular necrosis (HCC)   . Acute systolic heart failure ([place])   . CAD (coronary artery disease)    a. s/p prior PCI. b. CABG 2007 at [facility] in [place] 2007. c. inferior STEMI 10/2015 s/p DES to dSVG-PDA.  . Cardiac arrest ([place])   . Cervical cancer ([place])   . Chronic diastolic CHF (congestive heart failure) ([place])   . Chronic respiratory failure ([place])    s/p tracheostomy 2002  . Chronic RUQ pain   .  COPD (chronic obstructive pulmonary disease) ([place])   . Diabetes mellitus ([place])   . DVT (deep venous thrombosis) ([place])   . Endometriosis   . History of gallstones 01/2016   seen on Ultrasound  . History of HIDA scan 11/2016   normal  . HTN (hypertension)   . Hyperlipidemia   . Lupus anticoagulant disorder (HCC)    on coumadin  . Morbid obesity ([place])   . Psoriasis   . ST elevation (STEMI) myocardial infarction involving right coronary artery ([place]) 10/29/15   stent to VG to PDA  . Toe fracture, right 03/29/2018  . Tracheostomy in place [facility]), chronic since 2002 11/03/2015  . Tracheostomy status ([place])     Medications:  Medications Prior to Admission  Medication Sig Dispense Refill Last Dose  . albuterol (PROVENTIL) (2.5 MG/3ML) 0.083% nebulizer solution Take 3 mLs (2.5 mg total) by nebulization 2 (two) times daily. (Patient taking differently: Take 2.5 mg by nebulization every 4 (four) hours as needed for wheezing. ) 75 mL 12 Past Week at Unknown time  . albuterol (VENTOLIN HFA) 108 (90 Base) MCG/ACT inhaler Inhale 2 puffs into the lungs every 4 (four) hours as needed for wheezing or shortness of breath.   Past Week at Unknown time  . atorvastatin (LIPITOR) 80  MG tablet Take 1 tablet (80 mg total) by mouth daily. 90 tablet 1 06/14/2020 at Unknown time  . gabapentin (NEURONTIN) 300 MG capsule Take 1 capsule (300mg ) in the morning, 1 capsule (300mg ) in the afternoon, and 2 capsules (600mg ) at bedtime (Patient taking differently: Take 900 mg by mouth at bedtime. ) 120 capsule 2 06/13/2020 at Unknown time  . insulin degludec (TRESIBA FLEXTOUCH) 100 UNIT/ML FlexTouch Pen Inject 50 Units into the skin at bedtime.    06/13/2020 at Unknown time  . LORazepam (ATIVAN) 0.5 MG tablet Take 0.5 mg by mouth every 6 (six) hours as needed for anxiety.    Past Week at Unknown time  . losartan (COZAAR) 25 MG tablet Take 1 tablet (25 mg total) by mouth daily. 30 tablet 2 06/14/2020 at Unknown time  . metoprolol  succinate (TOPROL-XL) 50 MG 24 hr tablet Take 1 tablet (50 mg total) by mouth daily. 90 tablet 3 06/14/2020 at 0800-0900  . nitroGLYCERIN (NITROSTAT) 0.4 MG SL tablet Place 0.4 mg under the tongue every 5 (five) minutes as needed for chest pain.   Past Month at Unknown time  . NOVOLOG FLEXPEN 100 UNIT/ML FlexPen Inject 1-10 Units into the skin 3 (three) times daily before meals.   06/14/2020 at Unknown time  . OZEMPIC, 0.25 OR 0.5 MG/DOSE, 2 MG/1.5ML SOPN Inject 0.5 mg into the skin every Wednesday.    06/11/2020  . pantoprazole (PROTONIX) 20 MG tablet Take 20 mg by mouth daily.   06/14/2020 at Unknown time  . potassium chloride SA (KLOR-CON) 20 MEQ tablet Take 20 mEq by mouth daily.   06/14/2020 at Unknown time  . sertraline (ZOLOFT) 50 MG tablet Take 50 mg by mouth at bedtime.    06/13/2020 at Unknown time  . tamsulosin (FLOMAX) 0.4 MG CAPS capsule Take 0.4 mg by mouth daily.   06/14/2020 at Unknown time  . torsemide (DEMADEX) 20 MG tablet Take 2 tablets (40 mg total) by mouth 2 (two) times daily. 60 tablet 3 06/14/2020 at Unknown time  . traMADol (ULTRAM) 50 MG tablet Take 50 mg by mouth 3 (three) times daily as needed for moderate pain.    Past Month at Unknown time  . warfarin (COUMADIN) 2.5 MG tablet Take 2 tablets (5 mg) tonight at 6 PM.  Go to your PCP for INR tomorrow. (Patient taking differently: Take 2.5-5 mg by mouth See admin instructions. Mon,Wed,Fri take 5mg   2.5mg  all other days)   06/14/2020 at 0800-0900    Assessment: 54 yo female on chronic warfarin for lupus anti-coagulant syndrome and history of VTE.  Patient has  history of CAD and STEMI. No longer plan to do surgery so will restart coumadin. Patient took her last dose of warfarin 2.5mg  on 06/13/20  Home dose of warfarin:  5mg  on Mon-Wed-Fri   and   2.5mg  on Tues/Thurs/Sat/Sun  INR 2.0- therapeutic  Goal of Therapy:  INR 2-3 Monitor platelets by anticoagulation protocol: Yes   Plan:  Coumadin 2.5mg  today x 1 PT-INR  daily Monitor for S/S of bleeding  Margot Ables, PharmD Clinical Pharmacist 06/19/2020 10:41 AM

## 2020-06-20 DIAGNOSIS — E1140 Type 2 diabetes mellitus with diabetic neuropathy, unspecified: Secondary | ICD-10-CM

## 2020-06-20 DIAGNOSIS — A419 Sepsis, unspecified organism: Secondary | ICD-10-CM | POA: Diagnosis not present

## 2020-06-20 DIAGNOSIS — R6520 Severe sepsis without septic shock: Secondary | ICD-10-CM | POA: Diagnosis not present

## 2020-06-20 LAB — CBC
HCT: 39.1 % (ref 36.0–46.0)
Hemoglobin: 12.9 g/dL (ref 12.0–15.0)
MCH: 31.2 pg (ref 26.0–34.0)
MCHC: 33 g/dL (ref 30.0–36.0)
MCV: 94.4 fL (ref 80.0–100.0)
Platelets: 287 10*3/uL (ref 150–400)
RBC: 4.14 MIL/uL (ref 3.87–5.11)
RDW: 16.2 % — ABNORMAL HIGH (ref 11.5–15.5)
WBC: 10 10*3/uL (ref 4.0–10.5)
nRBC: 0 % (ref 0.0–0.2)

## 2020-06-20 LAB — BASIC METABOLIC PANEL
Anion gap: 6 (ref 5–15)
Anion gap: 15 (ref 5–15)
BUN: 20 mg/dL (ref 6–20)
BUN: 21 mg/dL — ABNORMAL HIGH (ref 6–20)
CO2: 33 mmol/L — ABNORMAL HIGH (ref 22–32)
CO2: 33 mmol/L — ABNORMAL HIGH (ref 22–32)
Calcium: 8.5 mg/dL — ABNORMAL LOW (ref 8.9–10.3)
Calcium: 8.8 mg/dL — ABNORMAL LOW (ref 8.9–10.3)
Chloride: 98 mmol/L (ref 98–111)
Chloride: 89 mmol/L — ABNORMAL LOW (ref 98–111)
Creatinine, Ser: 0.77 mg/dL (ref 0.44–1.00)
Creatinine, Ser: 1.03 mg/dL — ABNORMAL HIGH (ref 0.44–1.00)
GFR, Estimated: >60 mL/min (ref >60)
GFR, Estimated: >60 mL/min (ref >60)
Glucose, Bld: 84 mg/dL (ref 70–99)
Glucose, Bld: 424 mg/dL — ABNORMAL HIGH (ref 70–99)
Potassium: 2.6 mmol/L — CL (ref 3.5–5.1)
Potassium: 4 mmol/L (ref 3.5–5.1)
Sodium: 137 mmol/L (ref 135–145)
Sodium: 137 mmol/L (ref 135–145)

## 2020-06-20 LAB — GLUCOSE, CAPILLARY
Glucose-Capillary: 77 mg/dL (ref 70–99)
Glucose-Capillary: 218 mg/dL — ABNORMAL HIGH (ref 70–99)
Glucose-Capillary: 356 mg/dL — ABNORMAL HIGH (ref 70–99)

## 2020-06-20 LAB — MAGNESIUM: Magnesium: 2.1 mg/dL (ref 1.7–2.4)

## 2020-06-20 LAB — PROTIME-INR
INR: 2.2 — ABNORMAL HIGH (ref 0.8–1.2)
Prothrombin Time: 24 seconds — ABNORMAL HIGH (ref 11.4–15.2)

## 2020-06-20 MED ORDER — WARFARIN SODIUM 5 MG PO TABS
5.0000 mg | ORAL_TABLET | Freq: Once | ORAL | Status: AC
  Administered 2020-06-20: 5 mg via ORAL
  Filled 2020-06-20: qty 1

## 2020-06-20 MED ORDER — TORSEMIDE 20 MG PO TABS
40.0000 mg | ORAL_TABLET | Freq: Two times a day (BID) | ORAL | 3 refills | Status: DC
Start: 2020-06-20 — End: 2020-07-07

## 2020-06-20 MED ORDER — LOSARTAN POTASSIUM 50 MG PO TABS
25.0000 mg | ORAL_TABLET | Freq: Every day | ORAL | Status: DC
  Administered 2020-06-20: 25 mg via ORAL
  Filled 2020-06-20: qty 1

## 2020-06-20 MED ORDER — TORSEMIDE 20 MG PO TABS
40.0000 mg | ORAL_TABLET | Freq: Two times a day (BID) | ORAL | Status: DC
  Administered 2020-06-20: 40 mg via ORAL
  Filled 2020-06-20: qty 2

## 2020-06-20 MED ORDER — POTASSIUM CHLORIDE 10 MEQ/100ML IV SOLN
10.0000 meq | INTRAVENOUS | Status: AC
  Administered 2020-06-20 (×2): 10 meq via INTRAVENOUS
  Filled 2020-06-20: qty 100

## 2020-06-20 MED ORDER — PREDNISONE 10 MG PO TABS: ORAL_TABLET | ORAL | 0 refills | Status: DC

## 2020-06-20 MED ORDER — CEPHALEXIN 500 MG PO CAPS
500.0000 mg | ORAL_CAPSULE | Freq: Three times a day (TID) | ORAL | 0 refills | Status: DC
Start: 2020-06-20 — End: 2020-07-29

## 2020-06-20 MED ORDER — POTASSIUM CHLORIDE CRYS ER 20 MEQ PO TBCR
40.0000 meq | EXTENDED_RELEASE_TABLET | ORAL | Status: AC
  Administered 2020-06-20 (×2): 40 meq via ORAL
  Filled 2020-06-20 (×2): qty 2

## 2020-06-20 MED ORDER — CEPHALEXIN 500 MG PO CAPS
500.0000 mg | ORAL_CAPSULE | Freq: Two times a day (BID) | ORAL | Status: DC
  Administered 2020-06-20: 500 mg via ORAL
  Filled 2020-06-20: qty 1

## 2020-06-20 MED ORDER — CEPHALEXIN 500 MG PO CAPS: 500.0000 mg | ORAL_CAPSULE | Freq: Three times a day (TID) | ORAL | Status: DC

## 2020-06-20 MED ORDER — POLYETHYLENE GLYCOL 3350 17 G PO PACK: 17.0000 g | PACK | Freq: Every day | ORAL | 0 refills | Status: AC | PRN

## 2020-06-20 MED ORDER — METOPROLOL SUCCINATE ER 50 MG PO TB24
25.0000 mg | ORAL_TABLET | Freq: Every day | ORAL | 3 refills | Status: DC
Start: 2020-06-20 — End: 2020-07-07

## 2020-06-20 NOTE — Progress Notes (Signed)
ANTICOAGULATION CONSULT NOTE -   Pharmacy Consult for Coumadin Indication: Lupus and h/o VTE  Allergies  Allergen Reactions  . Penicillins Rash    Has patient had a PCN reaction causing immediate rash, facial/tongue/throat swelling, SOB or lightheadedness with hypotension: Yes Has patient had a PCN reaction causing severe rash involving mucus membranes or skin necrosis: No Has patient had a PCN reaction that required hospitalization No Has patient had a PCN reaction occurring within the last 10 years: No If all of the above answers are "NO", then may proceed with Cephalosporin use.   REACTION: rash Pt has tolerated cefepime in the past.  . Ciprofloxacin     nausea  . Ibuprofen Rash    swelling in leg     Patient Measurements: Height: 4\' 10"  (147.3 cm) Weight: 96.9 kg (213 lb 10 oz) IBW/kg (Calculated) : 40.9  Vital Signs: Temp: 97.7 F (36.5 C) (10/15 0507) Temp Source: Oral (10/15 0507) BP: 113/79 (10/15 0507) Pulse Rate: 63 (10/15 0507)  Labs: Recent Labs    06/18/20 0636 06/18/20 0636 06/19/20 0622 06/20/20 0558 06/20/20 0559  HGB 11.7*   < > 12.4 12.9  --   HCT 36.2  --  39.6 39.1  --   PLT 277  --  293 287  --   LABPROT 18.8*  --  22.0* 24.0*  --   INR 1.6*  --  2.0* 2.2*  --   CREATININE 0.96  --  0.90  --  0.77   < > = values in this interval not displayed.    Estimated Creatinine Clearance: 80.3 mL/min (by C-G formula based on SCr of 0.77 mg/dL).   Medical History: Past Medical History:  Diagnosis Date  . Acute kidney failure with lesion of tubular necrosis (HCC)   . Acute systolic heart failure ([place])   . CAD (coronary artery disease)    a. s/p prior PCI. b. CABG 2007 at [facility] in [place] 2007. c. inferior STEMI 10/2015 s/p DES to dSVG-PDA.  . Cardiac arrest ([place])   . Cervical cancer ([place])   . Chronic diastolic CHF (congestive heart failure) ([place])   . Chronic respiratory failure ([place])    s/p tracheostomy 2002  . Chronic RUQ pain    . COPD (chronic obstructive pulmonary disease) ([place])   . Diabetes mellitus ([place])   . DVT (deep venous thrombosis) ([place])   . Endometriosis   . History of gallstones 01/2016   seen on Ultrasound  . History of HIDA scan 11/2016   normal  . HTN (hypertension)   . Hyperlipidemia   . Lupus anticoagulant disorder (HCC)    on coumadin  . Morbid obesity ([place])   . Psoriasis   . ST elevation (STEMI) myocardial infarction involving right coronary artery ([place]) 10/29/15   stent to VG to PDA  . Toe fracture, right 03/29/2018  . Tracheostomy in place [facility]), chronic since 2002 11/03/2015  . Tracheostomy status ([place])     Medications:  Medications Prior to Admission  Medication Sig Dispense Refill Last Dose  . albuterol (PROVENTIL) (2.5 MG/3ML) 0.083% nebulizer solution Take 3 mLs (2.5 mg total) by nebulization 2 (two) times daily. (Patient taking differently: Take 2.5 mg by nebulization every 4 (four) hours as needed for wheezing. ) 75 mL 12 Past Week at Unknown time  . albuterol (VENTOLIN HFA) 108 (90 Base) MCG/ACT inhaler Inhale 2 puffs into the lungs every 4 (four) hours as needed for wheezing or shortness of breath.   Past Week  at Unknown time  . atorvastatin (LIPITOR) 80 MG tablet Take 1 tablet (80 mg total) by mouth daily. 90 tablet 1 06/14/2020 at Unknown time  . gabapentin (NEURONTIN) 300 MG capsule Take 1 capsule (300mg ) in the morning, 1 capsule (300mg ) in the afternoon, and 2 capsules (600mg ) at bedtime (Patient taking differently: Take 900 mg by mouth at bedtime. ) 120 capsule 2 06/13/2020 at Unknown time  . insulin degludec (TRESIBA FLEXTOUCH) 100 UNIT/ML FlexTouch Pen Inject 50 Units into the skin at bedtime.    06/13/2020 at Unknown time  . LORazepam (ATIVAN) 0.5 MG tablet Take 0.5 mg by mouth every 6 (six) hours as needed for anxiety.    Past Week at Unknown time  . losartan (COZAAR) 25 MG tablet Take 1 tablet (25 mg total) by mouth daily. 30 tablet 2 06/14/2020 at Unknown time  . metoprolol  succinate (TOPROL-XL) 50 MG 24 hr tablet Take 1 tablet (50 mg total) by mouth daily. 90 tablet 3 06/14/2020 at 0800-0900  . nitroGLYCERIN (NITROSTAT) 0.4 MG SL tablet Place 0.4 mg under the tongue every 5 (five) minutes as needed for chest pain.   Past Month at Unknown time  . NOVOLOG FLEXPEN 100 UNIT/ML FlexPen Inject 1-10 Units into the skin 3 (three) times daily before meals.   06/14/2020 at Unknown time  . OZEMPIC, 0.25 OR 0.5 MG/DOSE, 2 MG/1.5ML SOPN Inject 0.5 mg into the skin every Wednesday.    06/11/2020  . pantoprazole (PROTONIX) 20 MG tablet Take 20 mg by mouth daily.   06/14/2020 at Unknown time  . potassium chloride SA (KLOR-CON) 20 MEQ tablet Take 20 mEq by mouth daily.   06/14/2020 at Unknown time  . sertraline (ZOLOFT) 50 MG tablet Take 50 mg by mouth at bedtime.    06/13/2020 at Unknown time  . tamsulosin (FLOMAX) 0.4 MG CAPS capsule Take 0.4 mg by mouth daily.   06/14/2020 at Unknown time  . torsemide (DEMADEX) 20 MG tablet Take 2 tablets (40 mg total) by mouth 2 (two) times daily. 60 tablet 3 06/14/2020 at Unknown time  . traMADol (ULTRAM) 50 MG tablet Take 50 mg by mouth 3 (three) times daily as needed for moderate pain.    Past Month at Unknown time  . warfarin (COUMADIN) 2.5 MG tablet Take 2 tablets (5 mg) tonight at 6 PM.  Go to your PCP for INR tomorrow. (Patient taking differently: Take 2.5-5 mg by mouth See admin instructions. Mon,Wed,Fri take 5mg   2.5mg  all other days)   06/14/2020 at 0800-0900    Assessment: 54 yo female on chronic warfarin for lupus anti-coagulant syndrome and history of VTE.  Patient has  history of CAD and STEMI. No longer plan to do surgery so will restart coumadin. Patient took her last dose of warfarin 2.5mg  on 06/13/20  Home dose of warfarin:  5mg  on Mon-Wed-Fri   and   2.5mg  on Tues/Thurs/Sat/Sun  INR 2.2- therapeutic  Goal of Therapy:  INR 2-3 Monitor platelets by anticoagulation protocol: Yes   Plan:  Coumadin 5mg  today x 1 PT-INR  daily Monitor for S/S of bleeding  Margot Ables, PharmD Clinical Pharmacist 06/20/2020 2:00 PM

## 2020-06-20 NOTE — Progress Notes (Signed)
Progress Note  Patient Name: Theresa [name] Date of Encounter: 06/20/2020  Primary Cardiologist: [name], MD  Subjective   Breathing is better   No CP   Inpatient Medications    Scheduled Meds: . budesonide (PULMICORT) nebulizer solution  0.5 mg Nebulization BID  . cephALEXin  500 mg Oral Q12H  . furosemide  60 mg Intravenous BID  . gabapentin  300 mg Oral BID WC  . gabapentin  600 mg Oral QHS  . influenza vac split quadrivalent PF  0.5 mL Intramuscular Tomorrow-1000  . insulin aspart  0-15 Units Subcutaneous TID WC  . insulin aspart  0-5 Units Subcutaneous QHS  . insulin aspart  4 Units Subcutaneous TID WC  . insulin glargine  20 Units Subcutaneous Daily  . ipratropium-albuterol  3 mL Nebulization BID  . metoprolol succinate  25 mg Oral Daily  . pantoprazole  40 mg Oral BID  . polyethylene glycol  17 g Oral Daily  . potassium chloride  40 mEq Oral Q3H  . predniSONE  40 mg Oral Q breakfast  . Warfarin - Pharmacist Dosing Inpatient   Does not apply q1600   Continuous Infusions: . sodium chloride 500 mL (06/16/20 1451)  . potassium chloride     PRN Meds: sodium chloride, albuterol, temazepam, traMADol   Vital Signs    Vitals:   06/19/20 2115 06/20/20 0018 06/20/20 0421 06/20/20 0507  BP: 111/66   113/79  Pulse: 66 67 64 63  Resp: 16 18 18 16   Temp: 98.6 F (37 C)   97.7 F (36.5 C)  TempSrc: Oral   Oral  SpO2: 97% 94% 98% 99%  Weight:      Height:       No intake or output data in the 24 hours ending 06/20/20 1100 Filed Weights   06/18/20 0454 06/18/20 1900 06/19/20 0629  Weight: 93 kg 95.9 kg 96.9 kg    Telemetry    SR  Personally reviewed.  ECG    No interval tracing for review today.  Physical Exam   GEN: No acute distress.   Neck:  Tracheostomy in place. Cardiac: RRR, no gallop.  Respiratory: Nonlabored. Coarse rhonchi   No rales   GI:  Obese, nontender. MS:  Mild ankle edema; No deformity. Neuro:  Nonfocal.  Labs      Chemistry Recent Labs  Lab 06/17/20 0556 06/17/20 0556 06/18/20 0636 06/19/20 0622 06/20/20 0559  NA 141   < > 142 143 137  K 3.3*   < > 3.7 4.0 2.6*  CL 90*   < > 91* 91* 98  CO2 34*   < > 40* 39* 33*  GLUCOSE 192*   < > 73 47* 84  BUN 25*   < > 23* 19 20  CREATININE 1.07*   < > 0.96 0.90 0.77  CALCIUM 8.6*   < > 8.7* 8.7* 8.5*  PROT 7.2  --  6.4* 6.7  --   ALBUMIN 3.0*  --  2.7* 2.9*  --   AST 67*  --  33 27  --   ALT 127*  --  84* 68*  --   ALKPHOS 214*  --  197* 199*  --   BILITOT 2.4*  --  1.6* 1.5*  --   GFRNONAA 59*   < > >60 >60 >60  ANIONGAP 17*   < > 11 13 6    < > = values in this interval not displayed.     Hematology Recent Labs  Lab 06/18/20 0636 06/19/20 0622 06/20/20 0558  WBC 10.1 9.1 10.0  RBC 3.78* 4.12 4.14  HGB 11.7* 12.4 12.9  HCT 36.2 39.6 39.1  MCV 95.8 96.1 94.4  MCH 31.0 30.1 31.2  MCHC 32.3 31.3 33.0  RDW 16.1* 16.1* 16.2*  PLT 277 293 287    BNP Recent Labs  Lab 06/16/20 0712  BNP 920.0*     Radiology    No results found.  Cardiac Studies   Echocardiogram 04/20/2020: 1. Left ventricular ejection fraction, by estimation, is 40 to 45%. The  left ventricle has mildly decreased function. The left ventricle  demonstrates regional wall motion abnormalities (see scoring  diagram/findings for description). Left ventricular  diastolic parameters are consistent with Grade III diastolic dysfunction  (restrictive). Elevated left atrial pressure.  2. Right ventricular systolic function is normal. The right ventricular  size is normal. There is mildly elevated pulmonary artery systolic  pressure. The estimated right ventricular systolic pressure is 50.5 mmHg.  3. Left atrial size was mildly dilated.  4. Right atrial size was mildly dilated.  5. The mitral valve is degenerative. Mild mitral valve regurgitation.  6. The aortic valve is tricuspid. Aortic valve regurgitation is not  visualized. Mild aortic valve sclerosis is  present, with no evidence of  aortic valve stenosis.  7. The inferior vena cava is dilated in size with <50% respiratory  variability, suggesting right atrial pressure of 15 mmHg.   Patient Profile     54 y.o. female with a history of CAD (s/p CABG in 2007who is s/p STEMI in 10/2015 with DES to dSVG-PDA, repeat cath in 03/2018 showing patent LIMA-LAD and occluded SVG-RCA and RCA territory supplied by collaterals from LAD and LCx), chronic combined systolic and diastolic CHF, chronic hypoxic respiratory failure (s/p trach in 2002and on supplemental oxygen), lupus anticoagulant disorder with prior DVT (on Coumadin), HTN, HLD and Type 2 DM whois currently admitted for sepsis secondary to E.coli bacteremia and cholelithiasis. Cardiology consulted for CHF.   Assessment & Plan    1.  Acute on chronic combined heart failure, LVEF 40 to 45%. Volume status improved    Would pull back to po torsemide   Add back losartan WIll make sure she has f/u labs next week and f/u in clinic    2.  CAD status post CABG in 2007, DES to the SVG to PDA in 2017, and graft disease by angiography in 2019 with evidence of patent LIMA to the LAD and occluded SVG to RCA with RCA filling via collaterals from the left coronary system.  No complaints of CP.  3  HL     Statin held with recent abnormal LFTs, but these have been improving.  Continue to follow for now   I would not resume yet   Could as outpt when LFTs at baseline   3.  Chronic hypoxic respiratory failure status post trach in 2002.  She is on 4 L oxygen at baseline.  4.  Lupus anticoagulant with history of DVT, Pt remains on chronic Coumadin.  5.  Sepsis with E. coli bacteremia and cholecystitis.  Not felt to be a good surgical candidate.  Managing medically on the hospitalist team.   Signed, Dorris Carnes, MD  06/20/2020, 11:00 AM

## 2020-06-20 NOTE — Discharge Summary (Signed)
Physician Discharge Summary  KIMBERLY NIELAND QHU:[id number] DOB: 01/12/1966 DOA: 06/14/2020  PCP: No primary care provider on file.  Admit date: 06/14/2020 Discharge date: 06/20/2020  Admitted From: Home Disposition: Home  Recommendations for Outpatient Follow-up:  1. Follow up with PCP in 1-2 weeks 2. Please obtain BMP/CBC in one week 3. Patient will follow up with cardiology as an outpatient 4. Will need repeat LFTs in 1 week and if stable can consider restarting statin  Home Health: Home health RN Equipment/Devices:  Discharge Condition: Stable CODE STATUS: Full code Diet recommendation: Heart healthy  Brief/Interim Summary: 54 year old female with systolic and diastolic CHF, COPD, continued tobacco abuse, diabetes mellitus type 2, lupus anticoagulant syndrome, coronary artery disease with history of STEMI, hypertension, chronic respiratory failure on 4 L, chronic tracheostomy presenting with 1 day history of abdominal pain with associated nausea and vomiting. The patient began having nausea and nonbloody vomiting after eating a corn dog on 06/14/2020. The patient denies any diarrhea, fevers, chills, headache. She has had some shortness of breath over the last few days. Unfortunately, she continues to smoke 1/2 pack/day. She denies any hemoptysis. She denies any worsening orthopnea or lower extremity edema. In the emergency department, the patient was febrile up to 100.7 F with soft blood pressures. Systolic blood pressures were in the 90s to low 100s. Oxygen saturation was 94% on 5 L. CT of the abdomen and pelvis showed cholelithiasis without pericholecystic inflammation. There was also interlobular septal thickening and groundglass opacities concerning for pulmonary edema. The patient was started on cefepime and metronidazole. General surgery was consulted to assist with management.  Discharge Diagnoses:  Principal Problem:   Severe sepsis ([place]) Active Problems:   DM  (diabetes mellitus) ([place])   Hyperlipidemia LDL goal <70   Essential hypertension   COPD exacerbation (HCC)   Tracheostomy in place [facility]), chronic since 2002   Hypokalemia   Hyperglycemia due to diabetes mellitus (HCC)   Nausea & vomiting   Acute on chronic respiratory failure with hypoxia (HCC)   Uncontrolled type 2 diabetes mellitus with hyperglycemia, with long-term current use of insulin (HCC)   CAD (coronary artery disease)   Chronic respiratory insufficiency   Cigarette nicotine dependence without complication   DVT (deep venous thrombosis) (HCC)   Cholelithiasis   Biliary colic   Abdominal pain   Transaminitis   Sepsis due to undetermined organism (HCC)   Systolic and diastolic CHF, chronic (HCC)   Sepsis due to Escherichia coli (E. coli) ([place])   Bacteremia due to Gram-negative bacteria  Severe sepsis -due to Ecoli bacteremia -Concerned about cholecystitis -Present on admission -Presented with fever, low blood pressure, and leukocytosis -Lactic acid peaked 2.8 -She was treated with IV fluids -She was also treated with cetriaxone and metronidazole -Antibiotics have been transitioned to Augmentin based on culture sensitivities  Symptomatic cholelithiasis -Right upper quadrant ultrasound--Cholelithiasis without sonographic evidence of acute cholecystitis -06/15/2020 CT abdomen as discussed above -MRCP--no choledocholithiasis -General surgery consulted-->continue nonoperative management--discussed with Dr. Arnoldo Morale -not a good surgical candidate -GI consult appreciated -LFTs trending down, clinically improving -If patient has a recurrent episode of cholecystitis, will likely need to consider cholecystostomy tube  COPD exacerbation -StartedPulmicort -Continueduo nebs -Initially on IV steroids, changed to prednisone taper  Acute on chronic respiratory failure with hypoxia -Secondary to COPD exacerbation in the setting of OHS/OSA - oxygen requirement improved  10L>>>4L -Chronically on 4 L nasal cannula -Personally reviewed chest x-ray--increased interstitial markings, small bilateral pleural effusions  Acute onChronic systolic and diastolic CHF -  04/20/2020 echo EF 40-45%, grade 3 DD -She was initially treated with IV Lasix 60 mg twice daily and subsequently transitioned back to torsemide once volume status had improved -Losartan initially held due to soft blood pressures, restarted on admission -Holding metoprolol succinate secondary to soft blood pressure initially-->restarted at lower dose -consult cardiology--appreciated  Lupus anticoagulant syndrome -Holding Coumadin in anticipation for possible surgery -restart coumadin as there are no plans for surgery  Coronary artery disease -No chest pain presently  Uncontrolled diabetes mellitus type 2 with hyperglycemia -IncreaseLantus to 25 units -NovoLog sliding scale -10/11-hemoglobin A1c--8.9 -Restart Ozempic on discharge -Blood sugars elevated in the setting of steroids, anticipate this should improve as steroids are tapered  Depression -Continue sertraline and home dose lorazepam  Hypokalemia -replete -check mag--1.7  Discharge Instructions  Discharge Instructions    Diet - low sodium heart healthy   Complete by: As directed    Increase activity slowly   Complete by: As directed      Allergies as of 06/20/2020      Reactions   Penicillins Rash   Has patient had a PCN reaction causing immediate rash, facial/tongue/throat swelling, SOB or lightheadedness with hypotension: Yes Has patient had a PCN reaction causing severe rash involving mucus membranes or skin necrosis: No Has patient had a PCN reaction that required hospitalization No Has patient had a PCN reaction occurring within the last 10 years: No If all of the above answers are "NO", then may proceed with Cephalosporin use. REACTION: rash Pt has tolerated cefepime in the past.   Ciprofloxacin    nausea    Ibuprofen Rash   swelling in leg      Medication List    STOP taking these medications   atorvastatin 80 MG tablet Commonly known as: LIPITOR     TAKE these medications   albuterol 108 (90 Base) MCG/ACT inhaler Commonly known as: VENTOLIN HFA Inhale 2 puffs into the lungs every 4 (four) hours as needed for wheezing or shortness of breath. What changed: Another medication with the same name was changed. Make sure you understand how and when to take each.   albuterol (2.5 MG/3ML) 0.083% nebulizer solution Commonly known as: PROVENTIL Take 3 mLs (2.5 mg total) by nebulization 2 (two) times daily. What changed:   when to take this  reasons to take this   cephALEXin 500 MG capsule Commonly known as: KEFLEX Take 1 capsule (500 mg total) by mouth every 8 (eight) hours.   gabapentin 300 MG capsule Commonly known as: NEURONTIN Take 1 capsule (300mg ) in the morning, 1 capsule (300mg ) in the afternoon, and 2 capsules (600mg ) at bedtime What changed:   how much to take  how to take this  when to take this  additional instructions   LORazepam 0.5 MG tablet Commonly known as: ATIVAN Take 0.5 mg by mouth every 6 (six) hours as needed for anxiety.   losartan 25 MG tablet Commonly known as: COZAAR Take 1 tablet (25 mg total) by mouth daily.   metoprolol succinate 50 MG 24 hr tablet Commonly known as: TOPROL-XL Take 0.5 tablets (25 mg total) by mouth daily. What changed: how much to take   nitroGLYCERIN 0.4 MG SL tablet Commonly known as: NITROSTAT Place 0.4 mg under the tongue every 5 (five) minutes as needed for chest pain.   NovoLOG FlexPen 100 UNIT/ML FlexPen Generic drug: insulin aspart Inject 1-10 Units into the skin 3 (three) times daily before meals.   Ozempic (0.25 or 0.5 MG/DOSE) 2  MG/1.5ML Sopn Generic drug: Semaglutide(0.25 or 0.5MG /DOS) Inject 0.5 mg into the skin every Wednesday.   pantoprazole 20 MG tablet Commonly known as: PROTONIX Take 20 mg by  mouth daily.   polyethylene glycol 17 g packet Commonly known as: MIRALAX / GLYCOLAX Take 17 g by mouth daily as needed for mild constipation.   potassium chloride SA 20 MEQ tablet Commonly known as: KLOR-CON Take 20 mEq by mouth daily.   predniSONE 10 MG tablet Commonly known as: DELTASONE Take 40mg  po daily for 2 days then 30mg  daily for 2 days then 20mg  daily for 2 days then 10mg  daily for 2 days then stop   sertraline 50 MG tablet Commonly known as: ZOLOFT Take 50 mg by mouth at bedtime.   tamsulosin 0.4 MG Caps capsule Commonly known as: FLOMAX Take 0.4 mg by mouth daily.   torsemide 20 MG tablet Commonly known as: DEMADEX Take 2 tablets (40 mg total) by mouth 2 (two) times daily.   traMADol 50 MG tablet Commonly known as: ULTRAM Take 50 mg by mouth 3 (three) times daily as needed for moderate pain.   Tyler Aas FlexTouch 100 UNIT/ML FlexTouch Pen Generic drug: insulin degludec Inject 50 Units into the skin at bedtime.   warfarin 2.5 MG tablet Commonly known as: COUMADIN Take 2 tablets (5 mg) tonight at 6 PM.  Go to your PCP for INR tomorrow. What changed:   how much to take  how to take this  when to take this  additional instructions       Follow-up Information    Erlene Quan, PA-C Follow up on 07/01/2020.   Specialties: Cardiology, Radiology Why: Cardiology Hospital Follow-up on 07/01/2020 at 2:45 PM. Will be in the [facility] (attached to [facility]).  Contact information: [address] [place] [address] [phone]              Allergies  Allergen Reactions  . Penicillins Rash    Has patient had a PCN reaction causing immediate rash, facial/tongue/throat swelling, SOB or lightheadedness with hypotension: Yes Has patient had a PCN reaction causing severe rash involving mucus membranes or skin necrosis: No Has patient had a PCN reaction that required hospitalization No Has patient had a PCN reaction occurring within the  last 10 years: No If all of the above answers are "NO", then may proceed with Cephalosporin use.   REACTION: rash Pt has tolerated cefepime in the past.  . Ciprofloxacin     nausea  . Ibuprofen Rash    swelling in leg     Consultations:  General surgery  Cardiology  Gastroenterology   Procedures/Studies: CT ABDOMEN PELVIS W CONTRAST  Result Date: 06/15/2020 CLINICAL DATA:  Acute abdominal pain, nonlocalized.  Cholelithiasis. EXAM: CT ABDOMEN AND PELVIS WITH CONTRAST TECHNIQUE: Multidetector CT imaging of the abdomen and pelvis was performed using the standard protocol following bolus administration of intravenous contrast. CONTRAST:  153mL OMNIPAQUE IOHEXOL 300 MG/ML  SOLN COMPARISON:  04/23/2019 FINDINGS: Lower chest: The visualized lung bases demonstrates interlobular septal thickening and ground-glass pulmonary infiltrate likely representing pulmonary edema, possibly cardiogenic in nature. This appears improved since prior CT examination of 04/18/2020. Coronary artery stenting has been performed. Cardiac size is mildly enlarged. No pericardial effusion. Hepatobiliary: Cholelithiasis. No pericholecystic inflammatory change identified. Liver unremarkable. No intra or extrahepatic biliary ductal dilation. Pancreas: Unremarkable Spleen: Unremarkable Adrenals/Urinary Tract: Multiple bilateral adrenal nodules are unchanged measuring up to 2.5 cm within the left adrenal gland and compatible with multiple adenoma,  better evaluated on prior examination. Kidneys are unremarkable. Bladder is unremarkable. Stomach/Bowel: Stomach, small bowel, and large bowel are unremarkable. Appendix normal. No free intraperitoneal gas or fluid. Vascular/Lymphatic: Extensive aortoiliac atherosclerotic calcification is present. No aortic aneurysm. No pathologic adenopathy within the abdomen and pelvis. Reproductive: Uterus and bilateral adnexa are unremarkable. Other: Rectum unremarkable Musculoskeletal: No acute  bone abnormality. Remote compression fracture of T9 is unchanged. Superior endplate fracture of L3 is unchanged. IMPRESSION: Bibasilar pulmonary infiltrates and septal thickening in keeping with mild to moderate pulmonary edema, possibly cardiogenic in nature. This appears improved. Cholelithiasis without CT evidence of acute cholecystitis. Multiple stable adrenal adenoma. Biochemical correlation may be helpful to assess for hyperfunctioning. Aortic Atherosclerosis (ICD10-I70.0). Electronically Signed   By: Fidela Salisbury MD   On: 06/15/2020 04:03   MR 3D Recon At Scanner  Result Date: 06/16/2020 CLINICAL DATA:  Evaluate for gallstones.  Elevated bilirubin levels. EXAM: MRI ABDOMEN WITHOUT AND WITH CONTRAST (INCLUDING MRCP) TECHNIQUE: Multiplanar multisequence MR imaging of the abdomen was performed both before and after the administration of intravenous contrast. Heavily T2-weighted images of the biliary and pancreatic ducts were obtained, and three-dimensional MRCP images were rendered by post processing. CONTRAST:  45mL GADAVIST GADOBUTROL 1 MMOL/ML IV SOLN COMPARISON:  Right upper quadrant sonogram 06/15/2020 and CT AP 06/15/2020. FINDINGS: Diminished exam detail due to respiratory motion artifact. Lower chest: No acute findings. Cardiac enlargement. There is reflux of contrast material from the right heart into the IVC and right hepatic veins seen on the arterial phase images suggesting passive venous congestion secondary to right heart failure. Hepatobiliary: No suspicious focal liver abnormality identified. Stones are identified within the gallbladder as seen on previous imaging. These measure up to 5 mm. Upper limits of normal gallbladder wall thickening measuring up to 3 mm. No intrahepatic or common bile duct dilatation. No signs of choledocholithiasis. Pancreas: No mass, inflammatory changes, or other parenchymal abnormality identified. Spleen:  Within normal limits in size and appearance.  Adrenals/Urinary Tract: Bilateral adrenal nodules are identified which exhibit loss of signal on out of phase sequences compatible with benign adenomas. No kidney mass or hydronephrosis identified. Stomach/Bowel: Visualized portions within the abdomen are unremarkable. Vascular/Lymphatic: No pathologically enlarged lymph nodes identified. Aortic atherosclerosis. No abdominal aortic aneurysm demonstrated. Other:  None. Musculoskeletal: No suspicious bone lesions identified. IMPRESSION: 1. Diminished exam detail due to respiratory motion artifact. 2. Gallstones as seen on previous imaging. Upper limits of normal gallbladder wall thickening. No intrahepatic or common bile duct dilatation. No signs of choledocholithiasis. 3. Bilateral adrenal adenomas. Electronically Signed   By: Kerby Moors M.D.   On: 06/16/2020 10:56   DG Chest Port 1 View  Result Date: 06/15/2020 CLINICAL DATA:  Shortness of breath EXAM: PORTABLE CHEST 1 VIEW COMPARISON:  04/17/2020 FINDINGS: The tracheostomy tube terminates above the carina. The patient is status post prior median sternotomy. The heart size is enlarged. Coarse hazy bilateral airspace opacities are noted. These have improved since the prior study. There is architectural distortion and areas of scarring, similar to prior study. There is blunting of the costophrenic angles bilaterally similar to prior study. There is no acute osseous abnormality. IMPRESSION: Cardiomegaly with chronic bilateral airspace opacities. No definite acute cardiopulmonary process. Electronically Signed   By: Constance Holster M.D.   On: 06/15/2020 01:02   MR ABDOMEN MRCP W WO CONTAST  Result Date: 06/16/2020 CLINICAL DATA:  Evaluate for gallstones.  Elevated bilirubin levels. EXAM: MRI ABDOMEN WITHOUT AND WITH CONTRAST (INCLUDING MRCP) TECHNIQUE: Multiplanar multisequence  MR imaging of the abdomen was performed both before and after the administration of intravenous contrast. Heavily T2-weighted  images of the biliary and pancreatic ducts were obtained, and three-dimensional MRCP images were rendered by post processing. CONTRAST:  75mL GADAVIST GADOBUTROL 1 MMOL/ML IV SOLN COMPARISON:  Right upper quadrant sonogram 06/15/2020 and CT AP 06/15/2020. FINDINGS: Diminished exam detail due to respiratory motion artifact. Lower chest: No acute findings. Cardiac enlargement. There is reflux of contrast material from the right heart into the IVC and right hepatic veins seen on the arterial phase images suggesting passive venous congestion secondary to right heart failure. Hepatobiliary: No suspicious focal liver abnormality identified. Stones are identified within the gallbladder as seen on previous imaging. These measure up to 5 mm. Upper limits of normal gallbladder wall thickening measuring up to 3 mm. No intrahepatic or common bile duct dilatation. No signs of choledocholithiasis. Pancreas: No mass, inflammatory changes, or other parenchymal abnormality identified. Spleen:  Within normal limits in size and appearance. Adrenals/Urinary Tract: Bilateral adrenal nodules are identified which exhibit loss of signal on out of phase sequences compatible with benign adenomas. No kidney mass or hydronephrosis identified. Stomach/Bowel: Visualized portions within the abdomen are unremarkable. Vascular/Lymphatic: No pathologically enlarged lymph nodes identified. Aortic atherosclerosis. No abdominal aortic aneurysm demonstrated. Other:  None. Musculoskeletal: No suspicious bone lesions identified. IMPRESSION: 1. Diminished exam detail due to respiratory motion artifact. 2. Gallstones as seen on previous imaging. Upper limits of normal gallbladder wall thickening. No intrahepatic or common bile duct dilatation. No signs of choledocholithiasis. 3. Bilateral adrenal adenomas. Electronically Signed   By: Kerby Moors M.D.   On: 06/16/2020 10:56   [place] EKG SITE RITE  Result Date: 06/16/2020 If Site Rite image not attached,  placement could not be confirmed due to current cardiac rhythm.  US Abdomen Limited RUQ  Result Date: 06/15/2020 CLINICAL DATA:  Epigastric abdomen pain since yesterday. EXAM: ULTRASOUND ABDOMEN LIMITED RIGHT UPPER QUADRANT COMPARISON:  CT abdomen and pelvis June 15, 2020 FINDINGS: Gallbladder: Gallstones are noted the gallbladder. There is no pericholecystic fluid. Gallbladder wall measures 2.7 mm. No sonographic Murphy sign noted by sonographer. Common bile duct: Diameter: 3.6 mm Liver: No focal lesion identified. Within normal limits in parenchymal echogenicity. Portal vein is patent on color Doppler imaging with normal direction of blood flow towards the liver. Other: None. IMPRESSION: Cholelithiasis without sonographic evidence of acute cholecystitis. Electronically Signed   By: Abelardo Diesel M.D.   On: 06/15/2020 11:36       Subjective: Feeling better.  Shortness of breath has improved.  No significant cough.  Discharge Exam: Vitals:   06/20/20 0018 06/20/20 0421 06/20/20 0507 06/20/20 1407  BP:   113/79 (!) 111/59  Pulse: 67 64 63 67  Resp: 18 18 16 18   Temp:   97.7 F (36.5 C) 98.1 F (36.7 C)  TempSrc:   Oral Oral  SpO2: 94% 98% 99% 97%  Weight:      Height:        General: Pt is alert, awake, not in acute distress, chronic trach Cardiovascular: RRR, S1/S2 +, no rubs, no gallops Respiratory: CTA bilaterally, no wheezing, no rhonchi Abdominal: Soft, NT, ND, bowel sounds + Extremities: no edema, no cyanosis    The results of significant diagnostics from this hospitalization (including imaging, microbiology, ancillary and laboratory) are listed below for reference.     Microbiology: Recent Results (from the past 240 hour(s))  Respiratory Panel by RT PCR (Flu A&B, Covid) - Nasopharyngeal Swab  Status: None   Collection Time: 06/15/20 12:31 AM   Specimen: Nasopharyngeal Swab  Result Value Ref Range Status   SARS Coronavirus 2 by RT PCR NEGATIVE NEGATIVE Final     Comment: (NOTE) SARS-CoV-2 target nucleic acids are NOT DETECTED.  The SARS-CoV-2 RNA is generally detectable in upper respiratoy specimens during the acute phase of infection. The lowest concentration of SARS-CoV-2 viral copies this assay can detect is 131 copies/mL. A negative result does not preclude SARS-Cov-2 infection and should not be used as the sole basis for treatment or other patient management decisions. A negative result may occur with  improper specimen collection/handling, submission of specimen other than nasopharyngeal swab, presence of viral mutation(s) within the areas targeted by this assay, and inadequate number of viral copies (<131 copies/mL). A negative result must be combined with clinical observations, patient history, and epidemiological information. The expected result is Negative.  Fact Sheet for Patients:  PinkCheek.be  Fact Sheet for Healthcare Providers:  GravelBags.it  This test is no t yet approved or cleared by the [place] FDA and  has been authorized for detection and/or diagnosis of SARS-CoV-2 by FDA under an Emergency Use Authorization (EUA). This EUA will remain  in effect (meaning this test can be used) for the duration of the COVID-19 declaration under Section 564(b)(1) of the Act, 21 U.S.C. section 360bbb-3(b)(1), unless the authorization is terminated or revoked sooner.     Influenza A by PCR NEGATIVE NEGATIVE Final   Influenza B by PCR NEGATIVE NEGATIVE Final    Comment: (NOTE) The Xpert Xpress SARS-CoV-2/FLU/RSV assay is intended as an aid in  the diagnosis of influenza from Nasopharyngeal swab specimens and  should not be used as a sole basis for treatment. Nasal washings and  aspirates are unacceptable for Xpert Xpress SARS-CoV-2/FLU/RSV  testing.  Fact Sheet for Patients: PinkCheek.be  Fact Sheet for Healthcare  Providers: GravelBags.it  This test is not yet approved or cleared by the [place] FDA and  has been authorized for detection and/or diagnosis of SARS-CoV-2 by  FDA under an Emergency Use Authorization (EUA). This EUA will remain  in effect (meaning this test can be used) for the duration of the  Covid-19 declaration under Section 564(b)(1) of the Act, 21  U.S.C. section 360bbb-3(b)(1), unless the authorization is  terminated or revoked. Performed at [facility], [address] [place], [place] [address]   Urine culture     Status: None   Collection Time: 06/15/20  1:19 AM   Specimen: In/Out Cath Urine  Result Value Ref Range Status   Specimen Description   Final    IN/OUT CATH URINE Performed at [facility], [address], [place], [place] [address]    Special Requests   Final    NONE Performed at [facility], [address], [place], [place] [address]    Culture   Final    NO GROWTH Performed at [facility], [place] [address], [place], [place] [address]    Report Status 06/16/2020 FINAL  Final  Blood culture (routine x 2)     Status: Abnormal   Collection Time: 06/15/20  1:38 AM   Specimen: BLOOD RIGHT HAND  Result Value Ref Range Status   Specimen Description   Final    BLOOD RIGHT HAND Performed at [facility], [address], [place],  [address]    Special Requests   Final    BOTTLES DRAWN AEROBIC AND ANAEROBIC Blood Culture adequate volume Performed at [facility], [address] [place], [place] [address]    Culture  Setup Time   Final    GRAM NEGATIVE RODS ANAEROBIC BOTTLE ONLY Gram Stain Report Called to,Read Back By and Verified With: ANNE TUTTLE,RN 5284 10/10/2021KAY GRAM NEGATIVE RODS AEROBIC BOTTLE ONLY Gram Stain Report Called to,Read Back By and Verified With: ANNE TUTTLE,RN @1449  06/15/2020 KAY CRITICAL VALUE NOTED.  VALUE IS CONSISTENT WITH PREVIOUSLY REPORTED AND CALLED VALUE.    Culture (A)  Final     KLEBSIELLA PNEUMONIAE SUSCEPTIBILITIES PERFORMED ON PREVIOUS CULTURE WITHIN THE LAST 5 DAYS. Performed at [facility], [place] [address], [place], [place] [address]    Report Status 06/17/2020 FINAL  Final  Blood Culture (routine x 2)     Status: Abnormal   Collection Time: 06/15/20  1:50 AM   Specimen: BLOOD LEFT ARM  Result Value Ref Range Status   Specimen Description   Final    BLOOD LEFT ARM Performed at [facility], [address], [place], [place] [address]    Special Requests   Final    BOTTLES DRAWN AEROBIC AND ANAEROBIC Blood Culture adequate volume Performed at [facility], [address] [place], [place] [address]    Culture  Setup Time   Final    GRAM NEGATIVE RODS ANAEROBIC BOTTLE ONLY Gram Stain Report Called to,Read Back By and Verified With: ANNE TUTTLE,RN  @1449  06/15/2020 KAY GRAM NEGATIVE RODS AEROBIC BOTTLE ONLY Gram Stain Report Called to,Read Back By and Verified With: ANNE TUTTLE,RN @1449  06/15/2020 KAY CRITICAL RESULT CALLED TO, READ BACK BY AND VERIFIED WITH: PHARMD K MEYER [id number] AT 1210 BY CM Performed at [facility], [place] [address], [place], [place] [address]    Culture KLEBSIELLA PNEUMONIAE (A)  Final   Report Status 06/17/2020 FINAL  Final   Organism ID, Bacteria KLEBSIELLA PNEUMONIAE  Final      Susceptibility   Klebsiella pneumoniae - MIC*    AMPICILLIN RESISTANT Resistant     CEFAZOLIN <=4 SENSITIVE Sensitive     CEFEPIME <=0.12 SENSITIVE Sensitive     CEFTAZIDIME <=1 SENSITIVE Sensitive     CEFTRIAXONE <=0.25 SENSITIVE Sensitive     CIPROFLOXACIN <=0.25 SENSITIVE Sensitive     GENTAMICIN <=1 SENSITIVE Sensitive     IMIPENEM 0.5 SENSITIVE Sensitive     TRIMETH/SULFA <=20 SENSITIVE Sensitive     AMPICILLIN/SULBACTAM 4 SENSITIVE Sensitive     PIP/TAZO <=4 SENSITIVE Sensitive     * KLEBSIELLA PNEUMONIAE     Labs: BNP (last 3 results) Recent Labs    04/17/20 1923 06/16/20 0712  BNP 550.3* 638.7*   Basic Metabolic  Panel: Recent Labs  Lab 06/16/20 0712 06/16/20 0712 06/17/20 0556 06/18/20 0636 06/19/20 0622 06/20/20 0558 06/20/20 0559 06/20/20 1552  NA 137   < > 141 142 143  --  137 137  K 3.8   < > 3.3* 3.7 4.0  --  2.6* 4.0  CL 93*   < > 90* 91* 91*  --  98 89*  CO2 33*   < > 34* 40* 39*  --  33* 33*  GLUCOSE 370*   < > 192* 73 47*  --  84 424*  BUN 22*   < > 25* 23* 19  --  20 21*  CREATININE 1.02*   < > 1.07* 0.96 0.90  --  0.77 1.03*  CALCIUM 8.3*   < > 8.6* 8.7* 8.7*  --  8.5* 8.8*  MG 1.7  --   --  1.9  --  2.1  --   --   PHOS 3.4  --   --   --   --   --   --   --    < > = values in this interval not displayed.   Liver Function Tests: Recent Labs  Lab 06/15/20 0014 06/16/20 0712 06/17/20 0556 06/18/20 0636 06/19/20 0622  AST 485* 127* 67* 33 27  ALT 163* 147* 127* 84* 68*  ALKPHOS 232* 227* 214* 197* 199*  BILITOT 3.0* 5.3* 2.4* 1.6* 1.5*  PROT 6.9 6.9 7.2 6.4* 6.7  ALBUMIN 3.0* 2.7* 3.0* 2.7* 2.9*   Recent Labs  Lab 06/15/20 0014  LIPASE 25   No results for input(s): AMMONIA in the last 168 hours. CBC: Recent Labs  Lab 06/15/20 0014 06/15/20 0014 06/16/20 8841 06/17/20 0556 06/18/20 0636 06/19/20 0622 06/20/20 0558  WBC 14.8*   < > 10.1 15.9* 10.1 9.1 10.0  NEUTROABS 13.6*  --   --   --   --   --   --   HGB 12.2   < > 11.3* 12.0 11.7* 12.4 12.9  HCT 38.0   < > 35.0* 38.0 36.2 39.6 39.1  MCV 93.1   < > 95.1 95.2 95.8 96.1 94.4  PLT 247   < > 214 291 277 293 287   < > = values in this interval not displayed.   Cardiac Enzymes: No results for input(s): CKTOTAL, CKMB, CKMBINDEX, TROPONINI in the last 168 hours. BNP: Invalid input(s): POCBNP CBG: Recent Labs  Lab 06/19/20 1626 06/19/20 2115 06/20/20 0814 06/20/20 1143 06/20/20 1650  GLUCAP 275* 338* 77 218* 356*   D-Dimer No results for input(s): DDIMER in the last 72 hours. Hgb A1c No results for input(s): HGBA1C in the last 72 hours. Lipid Profile No results for input(s): CHOL, HDL, LDLCALC,  TRIG, CHOLHDL, LDLDIRECT in the last 72 hours. Thyroid function studies No results for input(s): TSH, T4TOTAL, T3FREE, THYROIDAB in the last 72 hours.  Invalid input(s): FREET3 Anemia work up No results for input(s): VITAMINB12, FOLATE, FERRITIN, TIBC, IRON, RETICCTPCT in the last 72 hours. Urinalysis    Component Value Date/Time   COLORURINE AMBER (A) 06/15/2020 0119   APPEARANCEUR CLEAR 06/15/2020 0119   LABSPEC 1.014 06/15/2020 0119   PHURINE 6.0 06/15/2020 0119   GLUCOSEU NEGATIVE 06/15/2020 0119   HGBUR NEGATIVE 06/15/2020 0119   BILIRUBINUR SMALL (A) 06/15/2020 0119   KETONESUR NEGATIVE 06/15/2020 0119   PROTEINUR 30 (A) 06/15/2020 0119   NITRITE NEGATIVE 06/15/2020 0119   LEUKOCYTESUR NEGATIVE 06/15/2020 0119   Sepsis Labs Invalid input(s): PROCALCITONIN,  WBC,  LACTICIDVEN Microbiology Recent Results (from the past 240 hour(s))  Respiratory Panel by RT PCR (Flu A&B, Covid) - Nasopharyngeal Swab     Status: None   Collection Time: 06/15/20 12:31 AM   Specimen: Nasopharyngeal Swab  Result Value Ref Range Status   SARS Coronavirus 2 by RT PCR NEGATIVE NEGATIVE Final    Comment: (NOTE) SARS-CoV-2 target nucleic acids are NOT DETECTED.  The SARS-CoV-2 RNA is generally detectable in upper respiratoy specimens during the acute phase of infection. The lowest concentration of SARS-CoV-2 viral copies this assay can detect is 131 copies/mL. A negative result does not preclude SARS-Cov-2 infection and should not be used as the sole basis for treatment or other patient management decisions. A negative result may occur with  improper specimen collection/handling, submission of specimen other than nasopharyngeal swab, presence of viral mutation(s) within the areas targeted by  this assay, and inadequate number of viral copies (<131 copies/mL). A negative result must be combined with clinical observations, patient history, and epidemiological information. The expected result is  Negative.  Fact Sheet for Patients:  PinkCheek.be  Fact Sheet for Healthcare Providers:  GravelBags.it  This test is no t yet approved or cleared by the [place] FDA and  has been authorized for detection and/or diagnosis of SARS-CoV-2 by FDA under an Emergency Use Authorization (EUA). This EUA will remain  in effect (meaning this test can be used) for the duration of the COVID-19 declaration under Section 564(b)(1) of the Act, 21 U.S.C. section 360bbb-3(b)(1), unless the authorization is terminated or revoked sooner.     Influenza A by PCR NEGATIVE NEGATIVE Final   Influenza B by PCR NEGATIVE NEGATIVE Final    Comment: (NOTE) The Xpert Xpress SARS-CoV-2/FLU/RSV assay is intended as an aid in  the diagnosis of influenza from Nasopharyngeal swab specimens and  should not be used as a sole basis for treatment. Nasal washings and  aspirates are unacceptable for Xpert Xpress SARS-CoV-2/FLU/RSV  testing.  Fact Sheet for Patients: PinkCheek.be  Fact Sheet for Healthcare Providers: GravelBags.it  This test is not yet approved or cleared by the [place] FDA and  has been authorized for detection and/or diagnosis of SARS-CoV-2 by  FDA under an Emergency Use Authorization (EUA). This EUA will remain  in effect (meaning this test can be used) for the duration of the  Covid-19 declaration under Section 564(b)(1) of the Act, 21  U.S.C. section 360bbb-3(b)(1), unless the authorization is  terminated or revoked. Performed at [facility], [address] [place], [place] [address]   Urine culture     Status: None   Collection Time: 06/15/20  1:19 AM   Specimen: In/Out Cath Urine  Result Value Ref Range Status   Specimen Description   Final    IN/OUT CATH URINE Performed at [facility], [address], [place], [place] [address]    Special Requests   Final     NONE Performed at [facility], [address] [place], [place] [address]    Culture   Final    NO GROWTH Performed at [facility], [place] [address] [place], [place] [address]    Report Status 06/16/2020 FINAL  Final  Blood culture (routine x 2)     Status: Abnormal   Collection Time: 06/15/20  1:38 AM   Specimen: BLOOD RIGHT HAND  Result Value Ref Range Status   Specimen Description   Final    BLOOD RIGHT HAND Performed at [facility], [address] [place], [place] [address]    Special Requests   Final    BOTTLES DRAWN AEROBIC AND ANAEROBIC Blood Culture adequate volume Performed at [facility], [address], [place], [place] [address]    Culture  Setup Time   Final    GRAM NEGATIVE RODS ANAEROBIC BOTTLE ONLY Gram Stain Report Called to,Read Back By and Verified With: Rudi Coco [id number] 10/10/2021KAY GRAM NEGATIVE RODS AEROBIC BOTTLE ONLY Gram Stain Report Called to,Read Back By and Verified With: ANNE TUTTLE,RN @1449  06/15/2020 KAY CRITICAL VALUE NOTED.  VALUE IS CONSISTENT WITH PREVIOUSLY REPORTED AND CALLED VALUE.    Culture (A)  Final    KLEBSIELLA PNEUMONIAE SUSCEPTIBILITIES PERFORMED ON PREVIOUS CULTURE WITHIN THE LAST 5 DAYS. Performed at [facility], [place] [address] [place],  [address]    Report Status 06/17/2020 FINAL  Final  Blood Culture (routine x  2)     Status: Abnormal   Collection Time: 06/15/20  1:50 AM   Specimen: BLOOD LEFT ARM  Result Value Ref Range Status   Specimen Description   Final    BLOOD LEFT ARM Performed at [facility], [address] [place], [place] [address]    Special Requests   Final    BOTTLES DRAWN AEROBIC AND ANAEROBIC Blood Culture adequate volume Performed at [facility], [address], [place], [place] [address]    Culture  Setup Time   Final    GRAM NEGATIVE RODS ANAEROBIC BOTTLE ONLY Gram Stain Report Called to,Read Back By and Verified With: ANNE TUTTLE,RN  @1449  06/15/2020 KAY GRAM NEGATIVE RODS  AEROBIC BOTTLE ONLY Gram Stain Report Called to,Read Back By and Verified With: ANNE TUTTLE,RN @1449  06/15/2020 KAY CRITICAL RESULT CALLED TO, READ BACK BY AND VERIFIED WITH: PHARMD K MEYER [id number] AT 1210 BY CM Performed at [facility], [place] [address], [place], [place] [address]    Culture KLEBSIELLA PNEUMONIAE (A)  Final   Report Status 06/17/2020 FINAL  Final   Organism ID, Bacteria KLEBSIELLA PNEUMONIAE  Final      Susceptibility   Klebsiella pneumoniae - MIC*    AMPICILLIN RESISTANT Resistant     CEFAZOLIN <=4 SENSITIVE Sensitive     CEFEPIME <=0.12 SENSITIVE Sensitive     CEFTAZIDIME <=1 SENSITIVE Sensitive     CEFTRIAXONE <=0.25 SENSITIVE Sensitive     CIPROFLOXACIN <=0.25 SENSITIVE Sensitive     GENTAMICIN <=1 SENSITIVE Sensitive     IMIPENEM 0.5 SENSITIVE Sensitive     TRIMETH/SULFA <=20 SENSITIVE Sensitive     AMPICILLIN/SULBACTAM 4 SENSITIVE Sensitive     PIP/TAZO <=4 SENSITIVE Sensitive     * KLEBSIELLA PNEUMONIAE     Time coordinating discharge: 41mins  SIGNED:   Kathie Dike, MD  [facility] 06/20/2020, 8:58 PM   If 7PM-7AM, please contact night-coverage [email or web address]

## 2020-06-20 NOTE — TOC Transition Note (Signed)
Transition of Care [facility]) - CM/SW Discharge Note   Patient Details  Name: Theresa [name] MRN: [id number] Date of Birth: 1966/02/13  [facility] CM/SW Contact:  Juanelle Trueheart, Chauncey Reading, RN Phone Number: 06/20/2020, 5:05 PM   Clinical Narrative:   Patient discharging home. [place] RN orders are in [place] [facility] will retrieve.                           Discharge Plan and Services                                     Social Determinants of Health (SDOH) Interventions     Readmission Risk Interventions Readmission Risk Prevention Plan 05/17/2019  Transportation Screening Complete  PCP or Specialist Appt within 3-5 Days Complete  HRI or [place] Complete  Social Work Consult for [place] Planning/Counseling Complete  Palliative Care Screening Not Applicable  Medication Review [employer]) Complete  Some recent data might be hidden

## 2020-06-20 NOTE — Progress Notes (Signed)
Nsg Discharge Note  Admit Date:  06/14/2020 Discharge date: 06/20/2020   Ilona Sorrel to be D/C'd home per MD order.  AVS completed.  Copy for chart, and copy for patient signed, and dated. Patient/caregiver able to verbalize understanding.  Discharge Medication: Allergies as of 06/20/2020      Reactions   Penicillins Rash   Has patient had a PCN reaction causing immediate rash, facial/tongue/throat swelling, SOB or lightheadedness with hypotension: Yes Has patient had a PCN reaction causing severe rash involving mucus membranes or skin necrosis: No Has patient had a PCN reaction that required hospitalization No Has patient had a PCN reaction occurring within the last 10 years: No If all of the above answers are "NO", then may proceed with Cephalosporin use. REACTION: rash Pt has tolerated cefepime in the past.   Ciprofloxacin    nausea   Ibuprofen Rash   swelling in leg      Medication List    STOP taking these medications   atorvastatin 80 MG tablet Commonly known as: LIPITOR     TAKE these medications   albuterol 108 (90 Base) MCG/ACT inhaler Commonly known as: VENTOLIN HFA Inhale 2 puffs into the lungs every 4 (four) hours as needed for wheezing or shortness of breath. What changed: Another medication with the same name was changed. Make sure you understand how and when to take each.   albuterol (2.5 MG/3ML) 0.083% nebulizer solution Commonly known as: PROVENTIL Take 3 mLs (2.5 mg total) by nebulization 2 (two) times daily. What changed:   when to take this  reasons to take this   cephALEXin 500 MG capsule Commonly known as: KEFLEX Take 1 capsule (500 mg total) by mouth every 8 (eight) hours.   gabapentin 300 MG capsule Commonly known as: NEURONTIN Take 1 capsule (300mg ) in the morning, 1 capsule (300mg ) in the afternoon, and 2 capsules (600mg ) at bedtime What changed:   how much to take  how to take this  when to take this  additional  instructions   LORazepam 0.5 MG tablet Commonly known as: ATIVAN Take 0.5 mg by mouth every 6 (six) hours as needed for anxiety.   losartan 25 MG tablet Commonly known as: COZAAR Take 1 tablet (25 mg total) by mouth daily.   metoprolol succinate 50 MG 24 hr tablet Commonly known as: TOPROL-XL Take 0.5 tablets (25 mg total) by mouth daily. What changed: how much to take   nitroGLYCERIN 0.4 MG SL tablet Commonly known as: NITROSTAT Place 0.4 mg under the tongue every 5 (five) minutes as needed for chest pain.   NovoLOG FlexPen 100 UNIT/ML FlexPen Generic drug: insulin aspart Inject 1-10 Units into the skin 3 (three) times daily before meals.   Ozempic (0.25 or 0.5 MG/DOSE) 2 MG/1.5ML Sopn Generic drug: Semaglutide(0.25 or 0.5MG /DOS) Inject 0.5 mg into the skin every Wednesday.   pantoprazole 20 MG tablet Commonly known as: PROTONIX Take 20 mg by mouth daily.   polyethylene glycol 17 g packet Commonly known as: MIRALAX / GLYCOLAX Take 17 g by mouth daily as needed for mild constipation.   potassium chloride SA 20 MEQ tablet Commonly known as: KLOR-CON Take 20 mEq by mouth daily.   predniSONE 10 MG tablet Commonly known as: DELTASONE Take 40mg  po daily for 2 days then 30mg  daily for 2 days then 20mg  daily for 2 days then 10mg  daily for 2 days then stop   sertraline 50 MG tablet Commonly known as: ZOLOFT Take 50 mg by mouth  at bedtime.   tamsulosin 0.4 MG Caps capsule Commonly known as: FLOMAX Take 0.4 mg by mouth daily.   torsemide 20 MG tablet Commonly known as: DEMADEX Take 2 tablets (40 mg total) by mouth 2 (two) times daily.   traMADol 50 MG tablet Commonly known as: ULTRAM Take 50 mg by mouth 3 (three) times daily as needed for moderate pain.   Tyler Aas FlexTouch 100 UNIT/ML FlexTouch Pen Generic drug: insulin degludec Inject 50 Units into the skin at bedtime.   warfarin 2.5 MG tablet Commonly known as: COUMADIN Take 2 tablets (5 mg) tonight at 6 PM.   Go to your PCP for INR tomorrow. What changed:   how much to take  how to take this  when to take this  additional instructions       Discharge Assessment: Vitals:   06/20/20 0507 06/20/20 1407  BP: 113/79 (!) 111/59  Pulse: 63 67  Resp: 16 18  Temp: 97.7 F (36.5 C) 98.1 F (36.7 C)  SpO2: 99% 97%   Skin clean, dry and intact without evidence of skin break down, no evidence of skin tears noted. IV catheter discontinued intact. Site without signs and symptoms of complications - no redness or edema noted at insertion site, patient denies c/o pain - only slight tenderness at site.  Dressing with slight pressure applied.  D/c Instructions-Education: Discharge instructions given to patient/family with verbalized understanding. D/c education completed with patient/family including follow up instructions, medication list, d/c activities limitations if indicated, with other d/c instructions as indicated by MD - patient able to verbalize understanding, all questions fully answered. Patient instructed to return to ED, call 911, or call MD for any changes in condition.  Patient escorted via [place], and D/C home via private auto.  Zachery Conch, RN 06/20/2020 6:14 PM

## 2020-06-20 NOTE — Care Management Important Message (Signed)
Important Message  Patient Details  Name: Theresa [name] MRN: [id number] Date of Birth: 10/29/1965   Medicare Important Message Given:  Yes     [name] 06/20/2020, 4:24 PM

## 2020-06-20 NOTE — Progress Notes (Signed)
CRITICAL VALUE ALERT  Critical Value:  Potassium 2.6  Date & Time Notied:  06/20/2020 @ 1009  Provider Notified: Memon  Orders Received/Actions taken: PO and IV potassium ordered.  See [facility]

## 2020-06-24 DIAGNOSIS — I10 Essential (primary) hypertension: Secondary | ICD-10-CM | POA: Diagnosis not present

## 2020-06-24 DIAGNOSIS — I82409 Acute embolism and thrombosis of unspecified deep veins of unspecified lower extremity: Secondary | ICD-10-CM | POA: Diagnosis not present

## 2020-06-24 DIAGNOSIS — Z299 Encounter for prophylactic measures, unspecified: Secondary | ICD-10-CM | POA: Diagnosis not present

## 2020-06-24 DIAGNOSIS — I5042 Chronic combined systolic (congestive) and diastolic (congestive) heart failure: Secondary | ICD-10-CM | POA: Diagnosis not present

## 2020-06-24 DIAGNOSIS — F17210 Nicotine dependence, cigarettes, uncomplicated: Secondary | ICD-10-CM | POA: Diagnosis not present

## 2020-06-24 DIAGNOSIS — Z6841 Body Mass Index (BMI) 40.0 and over, adult: Secondary | ICD-10-CM | POA: Diagnosis not present

## 2020-06-24 DIAGNOSIS — J449 Chronic obstructive pulmonary disease, unspecified: Secondary | ICD-10-CM | POA: Diagnosis not present

## 2020-06-25 ENCOUNTER — Inpatient Hospital Stay (HOSPITAL_COMMUNITY)
Admission: EM | Admit: 2020-06-25 | Discharge: 2020-07-07 | DRG: 291 | Disposition: A | Discharge status: Home Health | Payer: Medicare Other | Attending: Family Medicine | Admitting: Family Medicine

## 2020-06-25 ENCOUNTER — Telehealth: Payer: Self-pay | Admitting: Cardiology

## 2020-06-25 ENCOUNTER — Encounter (HOSPITAL_COMMUNITY): Payer: Self-pay | Admitting: Emergency Medicine

## 2020-06-25 ENCOUNTER — Other Ambulatory Visit: Payer: Self-pay

## 2020-06-25 ENCOUNTER — Emergency Department (HOSPITAL_COMMUNITY): Payer: Medicare Other

## 2020-06-25 DIAGNOSIS — R001 Bradycardia, unspecified: Secondary | ICD-10-CM | POA: Diagnosis not present

## 2020-06-25 DIAGNOSIS — E785 Hyperlipidemia, unspecified: Secondary | ICD-10-CM | POA: Diagnosis present

## 2020-06-25 DIAGNOSIS — R0689 Other abnormalities of breathing: Secondary | ICD-10-CM | POA: Diagnosis not present

## 2020-06-25 DIAGNOSIS — L03114 Cellulitis of left upper limb: Secondary | ICD-10-CM | POA: Diagnosis not present

## 2020-06-25 DIAGNOSIS — I2582 Chronic total occlusion of coronary artery: Secondary | ICD-10-CM | POA: Diagnosis present

## 2020-06-25 DIAGNOSIS — I4892 Unspecified atrial flutter: Secondary | ICD-10-CM | POA: Diagnosis not present

## 2020-06-25 DIAGNOSIS — Z955 Presence of coronary angioplasty implant and graft: Secondary | ICD-10-CM

## 2020-06-25 DIAGNOSIS — Z951 Presence of aortocoronary bypass graft: Secondary | ICD-10-CM | POA: Diagnosis not present

## 2020-06-25 DIAGNOSIS — I517 Cardiomegaly: Secondary | ICD-10-CM | POA: Diagnosis not present

## 2020-06-25 DIAGNOSIS — R0600 Dyspnea, unspecified: Secondary | ICD-10-CM | POA: Diagnosis not present

## 2020-06-25 DIAGNOSIS — I25700 Atherosclerosis of coronary artery bypass graft(s), unspecified, with unstable angina pectoris: Secondary | ICD-10-CM | POA: Diagnosis not present

## 2020-06-25 DIAGNOSIS — A4151 Sepsis due to Escherichia coli [E. coli]: Secondary | ICD-10-CM | POA: Diagnosis not present

## 2020-06-25 DIAGNOSIS — R000 Tachycardia, unspecified: Secondary | ICD-10-CM | POA: Diagnosis not present

## 2020-06-25 DIAGNOSIS — R0602 Shortness of breath: Secondary | ICD-10-CM | POA: Diagnosis not present

## 2020-06-25 DIAGNOSIS — Z930 Tracheostomy status: Secondary | ICD-10-CM

## 2020-06-25 DIAGNOSIS — D6862 Lupus anticoagulant syndrome: Secondary | ICD-10-CM | POA: Diagnosis present

## 2020-06-25 DIAGNOSIS — I4819 Other persistent atrial fibrillation: Secondary | ICD-10-CM | POA: Diagnosis not present

## 2020-06-25 DIAGNOSIS — Z880 Allergy status to penicillin: Secondary | ICD-10-CM

## 2020-06-25 DIAGNOSIS — I110 Hypertensive heart disease with heart failure: Principal | ICD-10-CM | POA: Diagnosis present

## 2020-06-25 DIAGNOSIS — R0902 Hypoxemia: Secondary | ICD-10-CM

## 2020-06-25 DIAGNOSIS — Z881 Allergy status to other antibiotic agents status: Secondary | ICD-10-CM

## 2020-06-25 DIAGNOSIS — I480 Paroxysmal atrial fibrillation: Secondary | ICD-10-CM | POA: Diagnosis not present

## 2020-06-25 DIAGNOSIS — F17210 Nicotine dependence, cigarettes, uncomplicated: Secondary | ICD-10-CM | POA: Diagnosis present

## 2020-06-25 DIAGNOSIS — J849 Interstitial pulmonary disease, unspecified: Secondary | ICD-10-CM | POA: Diagnosis present

## 2020-06-25 DIAGNOSIS — Z794 Long term (current) use of insulin: Secondary | ICD-10-CM | POA: Diagnosis not present

## 2020-06-25 DIAGNOSIS — Z8249 Family history of ischemic heart disease and other diseases of the circulatory system: Secondary | ICD-10-CM

## 2020-06-25 DIAGNOSIS — R601 Generalized edema: Secondary | ICD-10-CM

## 2020-06-25 DIAGNOSIS — J9811 Atelectasis: Secondary | ICD-10-CM | POA: Diagnosis not present

## 2020-06-25 DIAGNOSIS — I248 Other forms of acute ischemic heart disease: Secondary | ICD-10-CM | POA: Diagnosis present

## 2020-06-25 DIAGNOSIS — I10 Essential (primary) hypertension: Secondary | ICD-10-CM | POA: Diagnosis not present

## 2020-06-25 DIAGNOSIS — I25810 Atherosclerosis of coronary artery bypass graft(s) without angina pectoris: Secondary | ICD-10-CM | POA: Diagnosis present

## 2020-06-25 DIAGNOSIS — Z9981 Dependence on supplemental oxygen: Secondary | ICD-10-CM | POA: Diagnosis not present

## 2020-06-25 DIAGNOSIS — J449 Chronic obstructive pulmonary disease, unspecified: Secondary | ICD-10-CM | POA: Diagnosis present

## 2020-06-25 DIAGNOSIS — I272 Pulmonary hypertension, unspecified: Secondary | ICD-10-CM | POA: Diagnosis present

## 2020-06-25 DIAGNOSIS — Z79899 Other long term (current) drug therapy: Secondary | ICD-10-CM | POA: Diagnosis not present

## 2020-06-25 DIAGNOSIS — L409 Psoriasis, unspecified: Secondary | ICD-10-CM | POA: Diagnosis present

## 2020-06-25 DIAGNOSIS — J811 Chronic pulmonary edema: Secondary | ICD-10-CM | POA: Diagnosis not present

## 2020-06-25 DIAGNOSIS — E876 Hypokalemia: Secondary | ICD-10-CM | POA: Diagnosis not present

## 2020-06-25 DIAGNOSIS — I361 Nonrheumatic tricuspid (valve) insufficiency: Secondary | ICD-10-CM | POA: Diagnosis not present

## 2020-06-25 DIAGNOSIS — A419 Sepsis, unspecified organism: Secondary | ICD-10-CM | POA: Diagnosis not present

## 2020-06-25 DIAGNOSIS — G4733 Obstructive sleep apnea (adult) (pediatric): Secondary | ICD-10-CM | POA: Diagnosis not present

## 2020-06-25 DIAGNOSIS — T380X5A Adverse effect of glucocorticoids and synthetic analogues, initial encounter: Secondary | ICD-10-CM | POA: Diagnosis present

## 2020-06-25 DIAGNOSIS — Z8541 Personal history of malignant neoplasm of cervix uteri: Secondary | ICD-10-CM

## 2020-06-25 DIAGNOSIS — I2510 Atherosclerotic heart disease of native coronary artery without angina pectoris: Secondary | ICD-10-CM | POA: Diagnosis present

## 2020-06-25 DIAGNOSIS — I959 Hypotension, unspecified: Secondary | ICD-10-CM | POA: Diagnosis not present

## 2020-06-25 DIAGNOSIS — Z7901 Long term (current) use of anticoagulants: Secondary | ICD-10-CM | POA: Diagnosis not present

## 2020-06-25 DIAGNOSIS — E1165 Type 2 diabetes mellitus with hyperglycemia: Secondary | ICD-10-CM | POA: Diagnosis present

## 2020-06-25 DIAGNOSIS — Z833 Family history of diabetes mellitus: Secondary | ICD-10-CM

## 2020-06-25 DIAGNOSIS — I82409 Acute embolism and thrombosis of unspecified deep veins of unspecified lower extremity: Secondary | ICD-10-CM | POA: Diagnosis present

## 2020-06-25 DIAGNOSIS — Z86718 Personal history of other venous thrombosis and embolism: Secondary | ICD-10-CM

## 2020-06-25 DIAGNOSIS — Z886 Allergy status to analgesic agent status: Secondary | ICD-10-CM

## 2020-06-25 DIAGNOSIS — Z20822 Contact with and (suspected) exposure to covid-19: Secondary | ICD-10-CM | POA: Diagnosis present

## 2020-06-25 DIAGNOSIS — Z79891 Long term (current) use of opiate analgesic: Secondary | ICD-10-CM | POA: Diagnosis not present

## 2020-06-25 DIAGNOSIS — E1140 Type 2 diabetes mellitus with diabetic neuropathy, unspecified: Secondary | ICD-10-CM | POA: Diagnosis not present

## 2020-06-25 DIAGNOSIS — F32A Depression, unspecified: Secondary | ICD-10-CM | POA: Diagnosis present

## 2020-06-25 DIAGNOSIS — I5043 Acute on chronic combined systolic (congestive) and diastolic (congestive) heart failure: Secondary | ICD-10-CM | POA: Diagnosis not present

## 2020-06-25 DIAGNOSIS — J9621 Acute and chronic respiratory failure with hypoxia: Secondary | ICD-10-CM | POA: Diagnosis not present

## 2020-06-25 DIAGNOSIS — I5033 Acute on chronic diastolic (congestive) heart failure: Secondary | ICD-10-CM | POA: Diagnosis not present

## 2020-06-25 DIAGNOSIS — I471 Supraventricular tachycardia: Secondary | ICD-10-CM

## 2020-06-25 DIAGNOSIS — E119 Type 2 diabetes mellitus without complications: Secondary | ICD-10-CM | POA: Diagnosis not present

## 2020-06-25 DIAGNOSIS — J441 Chronic obstructive pulmonary disease with (acute) exacerbation: Secondary | ICD-10-CM | POA: Diagnosis not present

## 2020-06-25 DIAGNOSIS — I252 Old myocardial infarction: Secondary | ICD-10-CM

## 2020-06-25 DIAGNOSIS — Z8674 Personal history of sudden cardiac arrest: Secondary | ICD-10-CM

## 2020-06-25 DIAGNOSIS — Z7951 Long term (current) use of inhaled steroids: Secondary | ICD-10-CM | POA: Diagnosis not present

## 2020-06-25 DIAGNOSIS — K8050 Calculus of bile duct without cholangitis or cholecystitis without obstruction: Secondary | ICD-10-CM | POA: Diagnosis not present

## 2020-06-25 DIAGNOSIS — I4891 Unspecified atrial fibrillation: Secondary | ICD-10-CM | POA: Diagnosis not present

## 2020-06-25 DIAGNOSIS — M159 Polyosteoarthritis, unspecified: Secondary | ICD-10-CM | POA: Diagnosis not present

## 2020-06-25 LAB — PROTIME-INR
INR: 1.8 — ABNORMAL HIGH (ref 0.8–1.2)
Prothrombin Time: 20.2 seconds — ABNORMAL HIGH (ref 11.4–15.2)

## 2020-06-25 LAB — CBC WITH DIFFERENTIAL/PLATELET
Abs Immature Granulocytes: 0.1 10*3/uL — ABNORMAL HIGH (ref 0.00–0.07)
Basophils Absolute: 0 10*3/uL (ref 0.0–0.1)
Basophils Relative: 0 %
Eosinophils Absolute: 0 10*3/uL (ref 0.0–0.5)
Eosinophils Relative: 0 %
HCT: 39.2 % (ref 36.0–46.0)
Hemoglobin: 12.5 g/dL (ref 12.0–15.0)
Immature Granulocytes: 1 %
Lymphocytes Relative: 7 %
Lymphs Abs: 0.7 10*3/uL (ref 0.7–4.0)
MCH: 30.7 pg (ref 26.0–34.0)
MCHC: 31.9 g/dL (ref 30.0–36.0)
MCV: 96.3 fL (ref 80.0–100.0)
Monocytes Absolute: 0.4 10*3/uL (ref 0.1–1.0)
Monocytes Relative: 4 %
Neutro Abs: 9 10*3/uL — ABNORMAL HIGH (ref 1.7–7.7)
Neutrophils Relative %: 88 %
Platelets: 260 10*3/uL (ref 150–400)
RBC: 4.07 MIL/uL (ref 3.87–5.11)
RDW: 16.2 % — ABNORMAL HIGH (ref 11.5–15.5)
WBC: 10.3 10*3/uL (ref 4.0–10.5)
nRBC: 0 % (ref 0.0–0.2)

## 2020-06-25 LAB — COMPREHENSIVE METABOLIC PANEL
ALT: 29 U/L (ref 0–44)
AST: 17 U/L (ref 15–41)
Albumin: 2.9 g/dL — ABNORMAL LOW (ref 3.5–5.0)
Alkaline Phosphatase: 173 U/L — ABNORMAL HIGH (ref 38–126)
Anion gap: 10 (ref 5–15)
BUN: 17 mg/dL (ref 6–20)
CO2: 33 mmol/L — ABNORMAL HIGH (ref 22–32)
Calcium: 7.9 mg/dL — ABNORMAL LOW (ref 8.9–10.3)
Chloride: 91 mmol/L — ABNORMAL LOW (ref 98–111)
Creatinine, Ser: 0.78 mg/dL (ref 0.44–1.00)
GFR, Estimated: >60 mL/min (ref >60)
Glucose, Bld: 473 mg/dL — ABNORMAL HIGH (ref 70–99)
Potassium: 3.5 mmol/L (ref 3.5–5.1)
Sodium: 134 mmol/L — ABNORMAL LOW (ref 135–145)
Total Bilirubin: 0.9 mg/dL (ref 0.3–1.2)
Total Protein: 6.3 g/dL — ABNORMAL LOW (ref 6.5–8.1)

## 2020-06-25 LAB — RESPIRATORY PANEL BY RT PCR (FLU A&B, COVID)
Influenza A by PCR: NEGATIVE
Influenza B by PCR: NEGATIVE
SARS Coronavirus 2 by RT PCR: NEGATIVE

## 2020-06-25 LAB — BRAIN NATRIURETIC PEPTIDE: B Natriuretic Peptide: 536 pg/mL — ABNORMAL HIGH (ref 0.0–100.0)

## 2020-06-25 LAB — GLUCOSE, CAPILLARY: Glucose-Capillary: 392 mg/dL — ABNORMAL HIGH (ref 70–99)

## 2020-06-25 MED ORDER — IPRATROPIUM-ALBUTEROL 0.5-2.5 (3) MG/3ML IN SOLN
3.0000 mL | Freq: Once | RESPIRATORY_TRACT | Status: DC
Start: 2020-06-25 — End: 2020-06-25

## 2020-06-25 MED ORDER — POTASSIUM CHLORIDE 20 MEQ PO PACK
40.0000 meq | PACK | Freq: Two times a day (BID) | ORAL | Status: AC
  Administered 2020-06-25 – 2020-06-26 (×2): 40 meq via ORAL
  Filled 2020-06-25 (×2): qty 2

## 2020-06-25 MED ORDER — INSULIN GLARGINE 100 UNIT/ML ~~LOC~~ SOLN
50.0000 [IU] | Freq: Every day | SUBCUTANEOUS | Status: DC
  Administered 2020-06-25 – 2020-06-27 (×3): 50 [IU] via SUBCUTANEOUS
  Filled 2020-06-25 (×5): qty 0.5

## 2020-06-25 MED ORDER — IPRATROPIUM-ALBUTEROL 0.5-2.5 (3) MG/3ML IN SOLN
3.0000 mL | RESPIRATORY_TRACT | Status: AC
  Administered 2020-06-25: 3 mL via RESPIRATORY_TRACT
  Filled 2020-06-25: qty 3

## 2020-06-25 MED ORDER — ACETAMINOPHEN 325 MG PO TABS
650.0000 mg | ORAL_TABLET | Freq: Four times a day (QID) | ORAL | Status: DC | PRN
  Administered 2020-06-26 – 2020-07-05 (×10): 650 mg via ORAL
  Filled 2020-06-25 (×10): qty 2

## 2020-06-25 MED ORDER — FUROSEMIDE 10 MG/ML IJ SOLN
80.0000 mg | Freq: Once | INTRAMUSCULAR | Status: AC
  Administered 2020-06-25: 80 mg via INTRAVENOUS
  Filled 2020-06-25: qty 8

## 2020-06-25 MED ORDER — IPRATROPIUM-ALBUTEROL 0.5-2.5 (3) MG/3ML IN SOLN
3.0000 mL | Freq: Four times a day (QID) | RESPIRATORY_TRACT | Status: DC
Start: 2020-06-25 — End: 2020-06-25

## 2020-06-25 MED ORDER — LORAZEPAM 0.5 MG PO TABS
0.5000 mg | ORAL_TABLET | Freq: Four times a day (QID) | ORAL | Status: DC | PRN
  Administered 2020-06-26 – 2020-06-27 (×2): 0.5 mg via ORAL
  Filled 2020-06-25 (×2): qty 1

## 2020-06-25 MED ORDER — INSULIN ASPART 100 UNIT/ML ~~LOC~~ SOLN
0.0000 [IU] | Freq: Every day | SUBCUTANEOUS | Status: DC
  Administered 2020-06-25: 5 [IU] via SUBCUTANEOUS
  Administered 2020-06-29: 2 [IU] via SUBCUTANEOUS
  Administered 2020-06-30: 3 [IU] via SUBCUTANEOUS

## 2020-06-25 MED ORDER — METOPROLOL SUCCINATE ER 25 MG PO TB24
25.0000 mg | ORAL_TABLET | Freq: Every day | ORAL | Status: DC
  Administered 2020-06-26 – 2020-06-29 (×3): 25 mg via ORAL
  Filled 2020-06-25 (×5): qty 1

## 2020-06-25 MED ORDER — GABAPENTIN 300 MG PO CAPS
900.0000 mg | ORAL_CAPSULE | Freq: Every day | ORAL | Status: DC
  Administered 2020-06-25 – 2020-07-06 (×12): 900 mg via ORAL
  Filled 2020-06-25 (×12): qty 3

## 2020-06-25 MED ORDER — ONDANSETRON HCL 4 MG PO TABS: 4.0000 mg | ORAL_TABLET | Freq: Four times a day (QID) | ORAL | Status: DC | PRN

## 2020-06-25 MED ORDER — ONDANSETRON HCL 4 MG/2ML IJ SOLN: 4.0000 mg | Freq: Four times a day (QID) | INTRAMUSCULAR | Status: DC | PRN

## 2020-06-25 MED ORDER — WARFARIN - PHARMACIST DOSING INPATIENT: Freq: Every day | Status: DC

## 2020-06-25 MED ORDER — ACETAMINOPHEN 650 MG RE SUPP: 650.0000 mg | Freq: Four times a day (QID) | RECTAL | Status: DC | PRN

## 2020-06-25 MED ORDER — SODIUM CHLORIDE 0.9% FLUSH: 3.0000 mL | INTRAVENOUS | Status: DC | PRN

## 2020-06-25 MED ORDER — PANTOPRAZOLE SODIUM 20 MG PO TBEC
20.0000 mg | DELAYED_RELEASE_TABLET | Freq: Every day | ORAL | Status: DC
  Filled 2020-06-25 (×2): qty 1

## 2020-06-25 MED ORDER — IPRATROPIUM-ALBUTEROL 0.5-2.5 (3) MG/3ML IN SOLN: 3.0000 mL | Freq: Four times a day (QID) | RESPIRATORY_TRACT | Status: DC

## 2020-06-25 MED ORDER — INSULIN DEGLUDEC 100 UNIT/ML ~~LOC~~ SOPN: 50.0000 [IU] | PEN_INJECTOR | Freq: Every day | SUBCUTANEOUS | Status: DC

## 2020-06-25 MED ORDER — GUAIFENESIN-DM 100-10 MG/5ML PO SYRP
10.0000 mL | ORAL_SOLUTION | Freq: Three times a day (TID) | ORAL | Status: AC
  Administered 2020-06-25 – 2020-06-27 (×5): 10 mL via ORAL
  Filled 2020-06-25 (×5): qty 10

## 2020-06-25 MED ORDER — WARFARIN SODIUM 2.5 MG PO TABS
2.5000 mg | ORAL_TABLET | ORAL | Status: AC
  Filled 2020-06-25: qty 1

## 2020-06-25 MED ORDER — INSULIN ASPART 100 UNIT/ML ~~LOC~~ SOLN
0.0000 [IU] | Freq: Three times a day (TID) | SUBCUTANEOUS | Status: DC
  Administered 2020-06-26: 2 [IU] via SUBCUTANEOUS
  Administered 2020-06-26: 8 [IU] via SUBCUTANEOUS
  Administered 2020-06-27 (×2): 3 [IU] via SUBCUTANEOUS
  Administered 2020-06-28: 11 [IU] via SUBCUTANEOUS
  Administered 2020-06-28: 8 [IU] via SUBCUTANEOUS
  Administered 2020-06-29: 5 [IU] via SUBCUTANEOUS
  Administered 2020-06-30: 8 [IU] via SUBCUTANEOUS
  Administered 2020-06-30: 11 [IU] via SUBCUTANEOUS

## 2020-06-25 MED ORDER — POLYETHYLENE GLYCOL 3350 17 G PO PACK: 17.0000 g | PACK | Freq: Every day | ORAL | Status: DC | PRN

## 2020-06-25 MED ORDER — LOSARTAN POTASSIUM 50 MG PO TABS
25.0000 mg | ORAL_TABLET | Freq: Every day | ORAL | Status: DC
  Administered 2020-06-26 – 2020-06-28 (×3): 25 mg via ORAL
  Filled 2020-06-25 (×3): qty 1

## 2020-06-25 MED ORDER — POTASSIUM CHLORIDE CRYS ER 20 MEQ PO TBCR
20.0000 meq | EXTENDED_RELEASE_TABLET | Freq: Every day | ORAL | Status: DC
  Administered 2020-06-27: 20 meq via ORAL
  Filled 2020-06-25: qty 1

## 2020-06-25 MED ORDER — FUROSEMIDE 10 MG/ML IJ SOLN
60.0000 mg | Freq: Two times a day (BID) | INTRAMUSCULAR | Status: DC
  Administered 2020-06-26: 60 mg via INTRAVENOUS
  Filled 2020-06-25: qty 6

## 2020-06-25 MED ORDER — TAMSULOSIN HCL 0.4 MG PO CAPS
0.4000 mg | ORAL_CAPSULE | Freq: Every day | ORAL | Status: DC
  Administered 2020-06-26 – 2020-07-07 (×12): 0.4 mg via ORAL
  Filled 2020-06-25 (×13): qty 1

## 2020-06-25 MED ORDER — SERTRALINE HCL 50 MG PO TABS
50.0000 mg | ORAL_TABLET | Freq: Every day | ORAL | Status: DC
  Administered 2020-06-25 – 2020-07-06 (×12): 50 mg via ORAL
  Filled 2020-06-25 (×12): qty 1

## 2020-06-25 MED ORDER — IPRATROPIUM-ALBUTEROL 0.5-2.5 (3) MG/3ML IN SOLN
3.0000 mL | RESPIRATORY_TRACT | Status: DC | PRN
  Administered 2020-06-26 – 2020-06-28 (×7): 3 mL via RESPIRATORY_TRACT
  Filled 2020-06-25 (×6): qty 3

## 2020-06-25 NOTE — ED Triage Notes (Signed)
Pt discharged from [facility] on Friday.  Here today for increasing SOB and swelling BLE.  Pt on O2@ 4l/m via n/c at home.  Pt has trache # 4 shiley.

## 2020-06-25 NOTE — H&P (Signed)
History and Physical    Theresa [name] XQJ:[id number] DOB: 1966-05-17 DOA: 06/25/2020  PCP: Monico Blitz, MD   Patient coming from: Home  I have personally briefly reviewed patient's old medical records in [place]  Chief Complaint: Difficulty breathing, leg swelling.  HPI: Theresa [name] is a 54 y.o. female with medical history significant for chronic respiratory failure on 4 L, systolic and diastolic CHF, coronary artery disease, DVT, diabetes mellitus, hypertension, psoriasis. Patient was seen today by [facility] nurse.  It was found that patient had gained 14 pounds since she was recently discharged from the hospital, patient was also having increasing difficulty breathing, with bilateral lower extremity swelling and bloating.  Patient was referred to the ED. Patient reports compliance with her torsemide 40mg  BID, and denies dietary indiscretion. Patient has a trach.  She reports productive cough, wheezing since yesterday.  No chest pain.  Recent hospitalization 10/9-10/15, for severe sepsis due to E. coli bacteremia, cholecystitis, lactic acid peaked at 2.8, she was treated with ceftriaxone and metronidazole, and transitioned over to Augmentin on discharge.  General surgery recommended nonoperative management cholecystitis, as patient is not a good surgical candidate.  She was also managed for acute on chronic hypoxic respiratory failure, and decompensated CHF, COPD exacerbation, requiring 10 L of oxygen, she was discharged on her baseline 4 L.  ED Course: O2 sats greater than 94% on 4 L nasal cannula, blood pressure 110s to 130s, BNP 536, creatinine 0.78 at baseline.  Glucose sodium 473.  EKG sinus rhythm without acute abnormality.  Covid test negative.  Portable chest x-ray showed moderate to marked severe interstitial edema and a chronic interstitial lung disease.  80 mg of Lasix was given hospitalist to admit for decompensated CHF.  Review of Systems: As per HPI all  other systems reviewed and negative.  Past Medical History:  Diagnosis Date  . Acute kidney failure with lesion of tubular necrosis (HCC)   . Acute systolic heart failure ([place])   . CAD (coronary artery disease)    a. s/p prior PCI. b. CABG 2007 at [facility] in [place] 2007. c. inferior STEMI 10/2015 s/p DES to dSVG-PDA.  . Cardiac arrest ([place])   . Cervical cancer ([place])   . Chronic diastolic CHF (congestive heart failure) ([place])   . Chronic respiratory failure ([place])    s/p tracheostomy 2002  . Chronic RUQ pain   . COPD (chronic obstructive pulmonary disease) ([place])   . Diabetes mellitus ([place])   . DVT (deep venous thrombosis) ([place])   . Endometriosis   . History of gallstones 01/2016   seen on Ultrasound  . History of HIDA scan 11/2016   normal  . HTN (hypertension)   . Hyperlipidemia   . Lupus anticoagulant disorder (HCC)    on coumadin  . Morbid obesity ([place])   . Psoriasis   . ST elevation (STEMI) myocardial infarction involving right coronary artery ([place]) 10/29/15   stent to VG to PDA  . Toe fracture, right 03/29/2018  . Tracheostomy in place [facility]), chronic since 2002 11/03/2015  . Tracheostomy status [facility])     Past Surgical History:  Procedure Laterality Date  . CARDIAC CATHETERIZATION N/A 10/29/2015   Procedure: Left Heart Cath and Cors/Grafts Angiography;  Surgeon: Burnell Blanks, MD;  Location: [place] [facility];  Service: Cardiovascular;  Laterality: N/A;  . CARDIAC CATHETERIZATION  10/29/2015   Procedure: Coronary Stent Intervention;  Surgeon: Burnell Blanks, MD;  Location: [place] [facility];  Service: Cardiovascular;;  . CAROTID STENT    . CESAREAN SECTION WITH BILATERAL TUBAL LIGATION    . CORONARY ARTERY BYPASS GRAFT  2007   2V  . IR GASTROSTOMY TUBE MOD SED  11/13/2018  . RIGHT/LEFT HEART CATH AND CORONARY/GRAFT ANGIOGRAPHY N/A 03/24/2018   Procedure: RIGHT/LEFT HEART CATH AND CORONARY/GRAFT ANGIOGRAPHY;  Surgeon: Belva Crome, MD;   Location: [place] [facility];  Service: Cardiovascular;  Laterality: N/A;  . TRACHEOSTOMY       reports that she has been smoking cigarettes. She started smoking about 41 years ago. She has been smoking about 0.75 packs per day. She has never used smokeless tobacco. She reports that she does not drink alcohol and does not use drugs.  Allergies  Allergen Reactions  . Penicillins Rash    Has patient had a PCN reaction causing immediate rash, facial/tongue/throat swelling, SOB or lightheadedness with hypotension: Yes Has patient had a PCN reaction causing severe rash involving mucus membranes or skin necrosis: No Has patient had a PCN reaction that required hospitalization No Has patient had a PCN reaction occurring within the last 10 years: No If all of the above answers are "NO", then may proceed with Cephalosporin use.   REACTION: rash Pt has tolerated cefepime in the past.  . Ciprofloxacin     nausea  . Ibuprofen Rash    swelling in leg     Family History  Problem Relation Age of Onset  . Hypertension Mother   . Diabetes Mother   . Hypertension Father   . Diabetes Father     Prior to Admission medications   Medication Sig Start Date End Date Taking? Authorizing Provider  albuterol (PROVENTIL) (2.5 MG/3ML) 0.083% nebulizer solution Take 3 mLs (2.5 mg total) by nebulization 2 (two) times daily. Patient taking differently: Take 2.5 mg by nebulization every 4 (four) hours as needed for wheezing.  10/19/18  Yes Erick Colace, NP  albuterol (VENTOLIN HFA) 108 (90 Base) MCG/ACT inhaler Inhale 2 puffs into the lungs every 4 (four) hours as needed for wheezing or shortness of breath.   Yes [provider]  cephALEXin (KEFLEX) 500 MG capsule Take 1 capsule (500 mg total) by mouth every 8 (eight) hours. 06/20/20  Yes Kathie Dike, MD  gabapentin (NEURONTIN) 300 MG capsule Take 1 capsule (300mg ) in the morning, 1 capsule (300mg ) in the afternoon, and 2 capsules (600mg ) at  bedtime Patient taking differently: Take 900 mg by mouth at bedtime.  01/19/19  Yes Sarajane Jews, Callie E, PA-C  insulin degludec (TRESIBA FLEXTOUCH) 100 UNIT/ML FlexTouch Pen Inject 50 Units into the skin at bedtime.    Yes [provider]  LORazepam (ATIVAN) 0.5 MG tablet Take 0.5 mg by mouth every 6 (six) hours as needed for anxiety.    Yes [provider]  losartan (COZAAR) 25 MG tablet Take 1 tablet (25 mg total) by mouth daily. 01/20/19  Yes Sande Rives E, PA-C  metoprolol succinate (TOPROL-XL) 50 MG 24 hr tablet Take 0.5 tablets (25 mg total) by mouth daily. 06/20/20  Yes Memon, Jolaine Artist, MD  mometasone (ELOCON) 0.1 % ointment Apply 1 application topically daily as needed. 06/24/20  Yes [provider]  nitroGLYCERIN (NITROSTAT) 0.4 MG SL tablet Place 0.4 mg under the tongue every 5 (five) minutes as needed for chest pain.   Yes [provider]  NOVOLOG FLEXPEN 100 UNIT/ML FlexPen Inject 1-10 Units into the skin 3 (three) times daily before meals. 04/06/20  Yes [provider]  OZEMPIC, 0.25 OR 0.5 MG/DOSE, 2 MG/1.5ML SOPN Inject 0.5 mg into the skin every Wednesday.  04/08/19  Yes [provider]  pantoprazole (PROTONIX) 20 MG tablet Take 20 mg by mouth daily. 02/16/20  Yes [provider]  polyethylene glycol (MIRALAX / GLYCOLAX) 17 g packet Take 17 g by mouth daily as needed for mild constipation. 06/20/20  Yes Memon, Jolaine Artist, MD  potassium chloride SA (KLOR-CON) 20 MEQ tablet Take 20 mEq by mouth daily. 04/17/20  Yes [provider]  predniSONE (DELTASONE) 10 MG tablet Take 40mg  po daily for 2 days then 30mg  daily for 2 days then 20mg  daily for 2 days then 10mg  daily for 2 days then stop 06/20/20  Yes Memon, Jolaine Artist, MD  sertraline (ZOLOFT) 50 MG tablet Take 50 mg by mouth at bedtime.    Yes [provider]  tamsulosin (FLOMAX) 0.4 MG CAPS capsule Take 0.4 mg by mouth daily.   Yes [provider]    torsemide (DEMADEX) 20 MG tablet Take 2 tablets (40 mg total) by mouth 2 (two) times daily. 06/20/20  Yes Kathie Dike, MD  traMADol (ULTRAM) 50 MG tablet Take 50 mg by mouth 3 (three) times daily as needed for moderate pain.  03/29/20  Yes [provider]  warfarin (COUMADIN) 2.5 MG tablet Take 2 tablets (5 mg) tonight at 6 PM.  Go to your PCP for INR tomorrow. Patient taking differently: Take 2.5-5 mg by mouth See admin instructions. Mon,Wed,Fri take 5mg   2.5mg  all other days 05/17/19  Yes Mercy Riding, MD    Physical Exam: Vitals:   06/25/20 1802 06/25/20 1820 06/25/20 1830 06/25/20 1900  BP: 128/77 110/73 132/75 138/82  Pulse: 74 71 72 77  Resp: 18 20 16 20   Temp:      TempSrc:      SpO2: 91% 94% 96% 97%  Weight:      Height:        Constitutional: NAD, calm, comfortable Vitals:   06/25/20 1802 06/25/20 1820 06/25/20 1830 06/25/20 1900  BP: 128/77 110/73 132/75 138/82  Pulse: 74 71 72 77  Resp: 18 20 16 20   Temp:      TempSrc:      SpO2: 91% 94% 96% 97%  Weight:      Height:       Eyes: PERRL, lids and conjunctivae normal ENMT: Mucous membranes are moist. Neck: Trach present,  Respiratory: Diffuse expiratory wheezing, Normal respiratory effort. No accessory muscle use.  Cardiovascular: Regular rate and rhythm, no murmurs / rubs / gallops.  Pitting extremity edema to knees, patient reports baseline marked tenderness on palpation of legs, so degree of edema note determined Abdomen: Soft, no tenderness, no masses palpated. No hepatosplenomegaly. Bowel sounds positive.  Musculoskeletal: no clubbing / cyanosis. No joint deformity upper and lower extremities. Good ROM, no contractures. Normal muscle tone.  Skin: no rashes, lesions, ulcers. No induration Neurologic: No apparent cranial nerve abnormality, moving extremities spontaneously. Psychiatric: Normal judgment and insight. Alert and oriented x 3. Normal mood.   Labs on Admission: I have personally reviewed  following labs and imaging studies  CBC: Recent Labs  Lab 06/19/20 0622 06/20/20 0558 06/25/20 1633  WBC 9.1 10.0 10.3  NEUTROABS  --   --  9.0*  HGB 12.4 12.9 12.5  HCT 39.6 39.1 39.2  MCV 96.1 94.4 96.3  PLT 293 287 650   Basic Metabolic Panel: Recent Labs  Lab 06/19/20 0622 06/20/20 0558 06/20/20 0559 06/20/20 1552 06/25/20 1633  NA 143  --  137 137 134*  K 4.0  --  2.6* 4.0 3.5  CL 91*  --  98 89* 91*  CO2 39*  --  33* 33* 33*  GLUCOSE 47*  --  84 424* 473*  BUN 19  --  20 21* 17  CREATININE 0.90  --  0.77 1.03* 0.78  CALCIUM 8.7*  --  8.5* 8.8* 7.9*  MG  --  2.1  --   --   --    Liver Function Tests: Recent Labs  Lab 06/19/20 0622 06/25/20 1633  AST 27 17  ALT 68* 29  ALKPHOS 199* 173*  BILITOT 1.5* 0.9  PROT 6.7 6.3*  ALBUMIN 2.9* 2.9*   Coagulation Profile: Recent Labs  Lab 06/19/20 0622 06/20/20 0558  INR 2.0* 2.2*   CBG: Recent Labs  Lab 06/19/20 1626 06/19/20 2115 06/20/20 0814 06/20/20 1143 06/20/20 1650  GLUCAP 275* 338* 77 218* 356*    Radiological Exams on Admission: DG Chest Port 1 View  Result Date: 06/25/2020 CLINICAL DATA:  Shortness of breath and bilateral lower extremity swelling. EXAM: PORTABLE CHEST 1 VIEW COMPARISON:  June 15, 2020 FINDINGS: A tracheostomy tube is in place. Multiple sternal wires and vascular clips are seen. Moderate to marked severity diffusely increased interstitial lung markings are present. Radiopaque surgical sutures are seen overlying the lateral aspect of the right lung base. There is no evidence of a pleural effusion or pneumothorax. The cardiac silhouette is markedly enlarged and unchanged in size. Degenerative changes seen throughout the thoracic spine. IMPRESSION: Stable cardiomegaly with moderate to marked severity interstitial edema and/or chronic interstitial lung disease. Electronically Signed   By: Virgina Norfolk M.D.   On: 06/25/2020 16:44    EKG: Independently reviewed.  Sinus rhythm,  rate 79, QTc 499.  No significant changes from prior EKG.  Assessment/Plan Principal Problem:   Acute on chronic combined systolic and diastolic HF (heart failure) (HCC) Active Problems:   DM (diabetes mellitus) ([place])   Essential hypertension   OSA (obstructive sleep apnea)   Lupus anticoagulant syndrome (HCC)   CAD (coronary artery disease) of artery bypass graft: PTCA/DES to distal body VG to PDA 10/29/15   Tracheostomy in place [facility]), chronic since 2002   Acute on chronic respiratory failure with hypoxia ([place])   Uncontrolled type 2 diabetes mellitus with hyperglycemia, with long-term current use of insulin (HCC)   Acute on chronic respiratory failure with hypoxia (HCC)   Chronic respiratory insufficiency   DVT (deep venous thrombosis) (HCC)   Acute on chronic combined diastolic and systolic heart [id number]-66 pound weight gain in 5 days, since she was recently discharged from the hospital. Bilateral lower extremity swelling, abdominal bloating, coughing, wheezing, chest x-ray showing pulmonary edema.  BNP 536, compared to prior this is not significantly elevated- ??  Because of her habitus.  Last echo EF 40 to 45%, with grade 3 diastolic dysfunction restrictive.  Reports compliance with 40 mg torsemide twice daily.  Appears she had good response to IV Lasix 60 mg twice daily on recent hospitalization.  Covid test negative. -IV Lasix 80 mg x 1 given, will continue 60 mg twice daily -Daily BMP -Strict input output, daily weight, fluid restriction -Monitor potassium and supplement -Will consult cardiology to assist with diuresis  COPD-patient is volume overloaded, possible component of COPD exacerbation, productive cough, dyspnea, wheezing, ?  Cardiac wheeze 2/2 marked pulmonary edema.  She is completing a course of steroids she is taking for COPD exacerbation. -DuoNebs  as needed and scheduled -Mucolytics scheduled -Flutter valve, -Respiratory therapy consult for trach collar care.  Lupus  anticoagulant syndrome, history of DVT on chronic anticoagulation -Continue warfarin,, pharmacy  to dose -Obtain PT/INR  History of CABG-no chest pain EKG unchanged. -Resume warfarin.  Uncontrolled diabetes mellitus type 2 with hyperglycemia-random glucose 473.,  Likely due to steroids she is taking to complete course for COPD exacerbation during recent hospitalization.  10/11-hemoglobin A1c--8.9 -holding Ozempic - SSI- M - Resume home tresiba 50u QHS  Depression -Continue sertraline and home dose lorazepam  DVT prophylaxis: Warfarin Code Status: Full code. Family Communication: Spouse at bedside.  All questions answered, plan of care explained. Disposition Plan: > 2 days, pending diuresis Consults called: Cardiology Admission status: Inpatient, telemetry. I certify that at the point of admission it is my clinical judgment that the patient will require inpatient hospital care spanning beyond 2 midnights from the point of admission due to high intensity of service, high risk for further deterioration and high frequency of surveillance required. The following factors support the patient status of inpatient: Fluid overload requiring diuresis and close monitoring.   [name] MD [facility]  06/25/2020, 8:00 PM

## 2020-06-25 NOTE — Telephone Encounter (Signed)
I agree that she needs to go to the emergency room again.  I am happy to order the home health nurse as requested and therapy as requested.

## 2020-06-25 NOTE — Telephone Encounter (Signed)
Spoke with Stephanie(Nurse) with [facility] regarding patient. Colletta Maryland states that patient was recently discharged from the hospital on 10/15 and when patient was weighed today there was a 14 pound weight gain. Colletta Maryland also states that upon assessment patient has wheezing bilaterally with yellow sputum from trach. Colletta Maryland states that patient also has 2+ pitting edema in the lower extremities. Patient also states that her abdomen feels very swollen as well. Colletta Maryland states that per patient she has not been making very much urine despite taking 60mg  of torsemide. Colletta Maryland states that patient is currently taking her torsemide 40mg  twice daily and today patient took an extra dose of torsemide but she has not had much urine output today. Colletta Maryland states that patient is very fatigued and falls asleep very easily. Colletta Maryland states that patients blood pressure today is 112/62. Advised stephanie that I would speak with Dr. Oval Linsey (DOD) regarding patient.    Spoke with Dr. Valda Favia) who states that patient should be evaluated in the Emergency Room. Spoke again with Colletta Maryland and advised her of Dr. Quintella Reichert recommendation. Patient was reluctant at first and wanted to know if she could be seen in the office by Dr. Percival Spanish. Advised patient that there was no availability today in office and that given patients symptoms that patient should be evaluated in the ED. Patient then verbalized understanding and stated she will go to [facility] to be evaluated.   Colletta Maryland with [facility] would also like to know if Dr. Percival Spanish can give an order for a home health aide to come out to patients house twice weekly as well as an order for Occupational therapy. Advised that I would forward message to Dr. Percival Spanish to make him aware of patients situation and to advise on orders. Verbalized Understanding.

## 2020-06-25 NOTE — Progress Notes (Signed)
ANTICOAGULATION CONSULT NOTE - Initial Consult  Pharmacy Consult for warfarin Indication: lupus anticoagulant and hx VTE  Allergies  Allergen Reactions  . Penicillins Rash    Has patient had a PCN reaction causing immediate rash, facial/tongue/throat swelling, SOB or lightheadedness with hypotension: Yes Has patient had a PCN reaction causing severe rash involving mucus membranes or skin necrosis: No Has patient had a PCN reaction that required hospitalization No Has patient had a PCN reaction occurring within the last 10 years: No If all of the above answers are "NO", then may proceed with Cephalosporin use.   REACTION: rash Pt has tolerated cefepime in the past.  . Ciprofloxacin     nausea  . Ibuprofen Rash    swelling in leg     Patient Measurements: Height: 4\' 10"  (147.3 cm) Weight: 96.9 kg (213 lb 10 oz) IBW/kg (Calculated) : 40.9  Vital Signs: Temp: 98.1 F (36.7 C) (10/20 1437) Temp Source: Oral (10/20 1437) BP: 138/82 (10/20 1900) Pulse Rate: 77 (10/20 1900)  Labs: Recent Labs    06/25/20 1633  HGB 12.5  HCT 39.2  PLT 260  CREATININE 0.78    Estimated Creatinine Clearance: 80.3 mL/min (by C-G formula based on SCr of 0.78 mg/dL).   Medical History: Past Medical History:  Diagnosis Date  . Acute kidney failure with lesion of tubular necrosis (HCC)   . Acute systolic heart failure ([place])   . CAD (coronary artery disease)    a. s/p prior PCI. b. CABG 2007 at [facility] in [place] 2007. c. inferior STEMI 10/2015 s/p DES to dSVG-PDA.  . Cardiac arrest ([place])   . Cervical cancer ([place])   . Chronic diastolic CHF (congestive heart failure) ([place])   . Chronic respiratory failure ([place])    s/p tracheostomy 2002  . Chronic RUQ pain   . COPD (chronic obstructive pulmonary disease) ([place])   . Diabetes mellitus ([place])   . DVT (deep venous thrombosis) ([place])   . Endometriosis   . History of gallstones 01/2016   seen on Ultrasound  . History of HIDA scan  11/2016   normal  . HTN (hypertension)   . Hyperlipidemia   . Lupus anticoagulant disorder (HCC)    on coumadin  . Morbid obesity ([place])   . Psoriasis   . ST elevation (STEMI) myocardial infarction involving right coronary artery ([place]) 10/29/15   stent to VG to PDA  . Toe fracture, right 03/29/2018  . Tracheostomy in place [facility]), chronic since 2002 11/03/2015  . Tracheostomy status ([place])     Medications:  Medications Prior to Admission  Medication Sig Dispense Refill Last Dose  . albuterol (PROVENTIL) (2.5 MG/3ML) 0.083% nebulizer solution Take 3 mLs (2.5 mg total) by nebulization 2 (two) times daily. (Patient taking differently: Take 2.5 mg by nebulization every 4 (four) hours as needed for wheezing. ) 75 mL 12 06/24/2020 at Unknown time  . albuterol (VENTOLIN HFA) 108 (90 Base) MCG/ACT inhaler Inhale 2 puffs into the lungs every 4 (four) hours as needed for wheezing or shortness of breath.   06/24/2020 at Unknown time  . cephALEXin (KEFLEX) 500 MG capsule Take 1 capsule (500 mg total) by mouth every 8 (eight) hours. 15 capsule 0 06/25/2020 at Unknown time  . gabapentin (NEURONTIN) 300 MG capsule Take 1 capsule (300mg ) in the morning, 1 capsule (300mg ) in the afternoon, and 2 capsules (600mg ) at bedtime (Patient taking differently: Take 900 mg by mouth at bedtime. ) 120 capsule 2 06/25/2020 at Unknown time  .  insulin degludec (TRESIBA FLEXTOUCH) 100 UNIT/ML FlexTouch Pen Inject 50 Units into the skin at bedtime.    06/24/2020 at Unknown time  . LORazepam (ATIVAN) 0.5 MG tablet Take 0.5 mg by mouth every 6 (six) hours as needed for anxiety.    06/25/2020 at Unknown time  . losartan (COZAAR) 25 MG tablet Take 1 tablet (25 mg total) by mouth daily. 30 tablet 2 06/25/2020 at Unknown time  . metoprolol succinate (TOPROL-XL) 50 MG 24 hr tablet Take 0.5 tablets (25 mg total) by mouth daily. 90 tablet 3 06/25/2020 at 0800  . mometasone (ELOCON) 0.1 % ointment Apply 1 application topically daily as  needed.   Past Month at Unknown time  . nitroGLYCERIN (NITROSTAT) 0.4 MG SL tablet Place 0.4 mg under the tongue every 5 (five) minutes as needed for chest pain.   unknown  . NOVOLOG FLEXPEN 100 UNIT/ML FlexPen Inject 1-10 Units into the skin 3 (three) times daily before meals.   06/25/2020 at Unknown time  . OZEMPIC, 0.25 OR 0.5 MG/DOSE, 2 MG/1.5ML SOPN Inject 0.5 mg into the skin every Wednesday.    Past Week at Unknown time  . pantoprazole (PROTONIX) 20 MG tablet Take 20 mg by mouth daily.   06/25/2020 at Unknown time  . polyethylene glycol (MIRALAX / GLYCOLAX) 17 g packet Take 17 g by mouth daily as needed for mild constipation. 14 each 0 Past Week at Unknown time  . potassium chloride SA (KLOR-CON) 20 MEQ tablet Take 20 mEq by mouth daily.   06/25/2020 at Unknown time  . predniSONE (DELTASONE) 10 MG tablet Take 40mg  po daily for 2 days then 30mg  daily for 2 days then 20mg  daily for 2 days then 10mg  daily for 2 days then stop 20 tablet 0 06/25/2020 at Unknown time  . sertraline (ZOLOFT) 50 MG tablet Take 50 mg by mouth at bedtime.    06/24/2020 at Unknown time  . tamsulosin (FLOMAX) 0.4 MG CAPS capsule Take 0.4 mg by mouth daily.   06/25/2020 at Unknown time  . torsemide (DEMADEX) 20 MG tablet Take 2 tablets (40 mg total) by mouth 2 (two) times daily. 60 tablet 3 06/25/2020 at Unknown time  . traMADol (ULTRAM) 50 MG tablet Take 50 mg by mouth 3 (three) times daily as needed for moderate pain.    Past Month at Unknown time  . warfarin (COUMADIN) 2.5 MG tablet Take 2 tablets (5 mg) tonight at 6 PM.  Go to your PCP for INR tomorrow. (Patient taking differently: Take 2.5-5 mg by mouth See admin instructions. Mon,Wed,Fri take 5mg   2.5mg  all other days)   06/25/2020 at 0800    Assessment: 49 yof who presents to the ED with increasing SOB and bilateral leg swelling. She was recently discharged from the hospital on 10/15. She takes warfarin PTA for lupus anticoagulant and hx VTE. Pharmacy consulted to  dose warfarin while inpatient. INR is subtherapeutic at 1.8 on admit. No bleeding noted, CBC is normal.  PTA: 2.5 mg daily except 5 mg MWF, last dose 10/20  Goal of Therapy:  INR 2-3 Monitor platelets by anticoagulation protocol: Yes   Plan:  Warfarin 2.5 mg PO extra tonight INR daily Monitor for s/sx of bleeding   Thank you for involving pharmacy in this patient's care.  Renold Genta, PharmD, BCPS Clinical Pharmacist Clinical phone for 06/25/2020 until 10p is [phone] 06/25/2020 8:38 PM  **Pharmacist phone directory can be found on [email or web address] listed under [place]**

## 2020-06-25 NOTE — ED Notes (Signed)
Attempted to call report to floor. Nurse will call back.

## 2020-06-25 NOTE — Progress Notes (Signed)
Placed patient on aerosol trach collar at 35% due to her secretions getting thicker and her being unable to cough them out. Explained that without a cap or speaking valve on and her trach just exposed to the air, she isn't getting much of the oxygen in her nose and her airway will dry out causing her secretions to thicken and plug up.

## 2020-06-25 NOTE — ED Provider Notes (Signed)
[facility] EMERGENCY DEPARTMENT Provider Note   CSN: [id number] Arrival date & time: 06/25/20  1416     History Chief Complaint  Patient presents with   Shortness of Breath    Theresa [name] is a 54 y.o. female.  Patient has a history of heart failure and COPD.  She was just discharged from the hospital 5 days ago.  She has developed more swelling in her legs and has gained 15 pounds.  She also complains of shortness of breath  The history is provided by the patient and medical records. No language interpreter was used.  Shortness of Breath Severity:  Moderate Onset quality:  Sudden Timing:  Constant Progression:  Worsening Chronicity:  Recurrent Context: not activity   Relieved by:  Nothing Worsened by:  Nothing Ineffective treatments:  None tried Associated symptoms: no abdominal pain, no chest pain, no cough, no headaches and no rash        Past Medical History:  Diagnosis Date   Acute kidney failure with lesion of tubular necrosis (HCC)    Acute systolic heart failure (HCC)    CAD (coronary artery disease)    a. s/p prior PCI. b. CABG 2007 at [facility] in [place] 2007. c. inferior STEMI 10/2015 s/p DES to dSVG-PDA.   Cardiac arrest [facility])    Cervical cancer (HCC)    Chronic diastolic CHF (congestive heart failure) (HCC)    Chronic respiratory failure (HCC)    s/p tracheostomy 2002   Chronic RUQ pain    COPD (chronic obstructive pulmonary disease) (HCC)    Diabetes mellitus ([place])    DVT (deep venous thrombosis) ([place])    Endometriosis    History of gallstones 01/2016   seen on Ultrasound   History of HIDA scan 11/2016   normal   HTN (hypertension)    Hyperlipidemia    Lupus anticoagulant disorder (HCC)    on coumadin   Morbid obesity (HCC)    Psoriasis    ST elevation (STEMI) myocardial infarction involving right coronary artery ([place]) 10/29/15   stent to VG to PDA   Toe fracture, right 03/29/2018   Tracheostomy in place  [facility]), chronic since 2002 11/03/2015   Tracheostomy status [facility])     Patient Active Problem List   Diagnosis Date Noted   Acute on chronic combined systolic and diastolic CHF (congestive heart failure) ([place]) 06/25/2020   Sepsis due to Escherichia coli (E. coli) ([place]) 06/16/2020   Bacteremia due to Gram-negative bacteria 06/16/2020   Severe sepsis ([place]) 84/66/5993   Biliary colic 57/09/7791   Abdominal pain 06/15/2020   Transaminitis 06/15/2020   Sepsis due to undetermined organism ([place]) 90/30/0923   Systolic and diastolic CHF, chronic ([place]) 06/15/2020   CHF (congestive heart failure) ([place]) 04/18/2020   Acute respiratory failure ([place]) 04/17/2020   Chronic venous insufficiency 06/19/2019   Cholelithiasis 05/07/2019   Adult body mass index 50.0-59.9 ([place]) 03/22/2019   Anxiety 03/22/2019   Low back pain 03/22/2019   Cigarette nicotine dependence without complication 30/03/6225   History of vitamin D deficiency 03/22/2019   Insomnia 03/22/2019   Knee pain 03/22/2019   Localized, primary osteoarthritis of lower leg, unspecified laterality 03/22/2019   Neck pain 03/22/2019   Psoriasis 03/22/2019   Thrombophlebitis of deep veins of lower extremity ([place]) 03/22/2019   DVT (deep venous thrombosis) ([place]) 03/22/2019   Fracture of humeral shaft, left, closed 03/22/2019   Chronic respiratory insufficiency 01/19/2019   CAD (coronary artery disease) 01/15/2019   Educated about COVID-19  virus infection 01/12/2019   Acute on chronic combined systolic and diastolic HF (heart failure) ([place]) 01/12/2019   Acute on chronic respiratory failure with hypoxia ([place]) 10/24/2018   Pulmonary hypertension, unspecified ([place])    Acute on chronic respiratory failure with hypoxia ([place]) 08/01/2017   Uncontrolled type 2 diabetes mellitus with hyperglycemia, with long-term current use of insulin ([employer]) 08/01/2017   Dyspnea 07/31/2017   Nausea & vomiting    Acute on chronic  congestive heart failure (HCC)    Acute respiratory failure with hypoxia ([place]) 01/22/2016   Hyperglycemia due to diabetes mellitus ([place]) 01/22/2016   CAD (coronary artery disease) of artery bypass graft: PTCA/DES to distal body VG to PDA 10/29/15 11/03/2015   Tracheostomy in place [facility]), chronic since 2002 11/03/2015   Hypokalemia 11/03/2015   Lupus anticoagulant syndrome (HCC)    Chronic diastolic CHF (congestive heart failure) (HCC)    Respiratory failure ([place])    Coronary artery disease involving coronary bypass graft of native heart without angina pectoris    OSA (obstructive sleep apnea)    COPD exacerbation (HCC)    Tobacco abuse    DM (diabetes mellitus) ([place]) 06/11/2009   Hyperlipidemia LDL goal <70 06/11/2009   Acute thromboembolism of deep veins of lower extremity ([place]) 06/11/2009   History of CABG 06/11/2009   Essential hypertension 05/27/2009   CAD, NATIVE VESSEL 05/27/2009    Past Surgical History:  Procedure Laterality Date   CARDIAC CATHETERIZATION N/A 10/29/2015   Procedure: Left Heart Cath and Cors/Grafts Angiography;  Surgeon: Burnell Blanks, MD;  Location: [place] [facility];  Service: Cardiovascular;  Laterality: N/A;   CARDIAC CATHETERIZATION  10/29/2015   Procedure: Coronary Stent Intervention;  Surgeon: Burnell Blanks, MD;  Location: [place] [facility];  Service: Cardiovascular;;   CAROTID STENT     CESAREAN SECTION WITH BILATERAL TUBAL LIGATION     CORONARY ARTERY BYPASS GRAFT  2007   2V   IR GASTROSTOMY TUBE MOD SED  11/13/2018   RIGHT/LEFT HEART CATH AND CORONARY/GRAFT ANGIOGRAPHY N/A 03/24/2018   Procedure: RIGHT/LEFT HEART CATH AND CORONARY/GRAFT ANGIOGRAPHY;  Surgeon: Belva Crome, MD;  Location: [place] [facility];  Service: Cardiovascular;  Laterality: N/A;   TRACHEOSTOMY       OB History   No obstetric history on file.     Family History  Problem Relation Age of Onset   Hypertension Mother    Diabetes  Mother    Hypertension Father    Diabetes Father     Social History   Tobacco Use   Smoking status: Current Every Day Smoker    Packs/day: 0.75    Types: Cigarettes    Start date: 10/23/1978   Smokeless tobacco: Never Used   Tobacco comment: 1/2 pack - trying to cut back   Vaping Use   Vaping Use: Never used  Substance Use Topics   Alcohol use: No    Alcohol/week: 0.0 standard drinks   Drug use: No    Home Medications Prior to Admission medications   Medication Sig Start Date End Date Taking? Authorizing Provider  albuterol (PROVENTIL) (2.5 MG/3ML) 0.083% nebulizer solution Take 3 mLs (2.5 mg total) by nebulization 2 (two) times daily. Patient taking differently: Take 2.5 mg by nebulization every 4 (four) hours as needed for wheezing.  10/19/18   Erick Colace, NP  albuterol (VENTOLIN HFA) 108 (90 Base) MCG/ACT inhaler Inhale 2 puffs into the lungs every 4 (four) hours as needed for wheezing or shortness of  breath.    [provider]  cephALEXin (KEFLEX) 500 MG capsule Take 1 capsule (500 mg total) by mouth every 8 (eight) hours. 06/20/20   Kathie Dike, MD  gabapentin (NEURONTIN) 300 MG capsule Take 1 capsule (300mg ) in the morning, 1 capsule (300mg ) in the afternoon, and 2 capsules (600mg ) at bedtime Patient taking differently: Take 900 mg by mouth at bedtime.  01/19/19   Sande Rives E, PA-C  insulin degludec (TRESIBA FLEXTOUCH) 100 UNIT/ML FlexTouch Pen Inject 50 Units into the skin at bedtime.     [provider]  LORazepam (ATIVAN) 0.5 MG tablet Take 0.5 mg by mouth every 6 (six) hours as needed for anxiety.     [provider]  losartan (COZAAR) 25 MG tablet Take 1 tablet (25 mg total) by mouth daily. 01/20/19   Sande Rives E, PA-C  metoprolol succinate (TOPROL-XL) 50 MG 24 hr tablet Take 0.5 tablets (25 mg total) by mouth daily. 06/20/20   Kathie Dike, MD  nitroGLYCERIN (NITROSTAT) 0.4 MG SL tablet Place 0.4 mg under the  tongue every 5 (five) minutes as needed for chest pain.    [provider]  NOVOLOG FLEXPEN 100 UNIT/ML FlexPen Inject 1-10 Units into the skin 3 (three) times daily before meals. 04/06/20   [provider]  OZEMPIC, 0.25 OR 0.5 MG/DOSE, 2 MG/1.5ML SOPN Inject 0.5 mg into the skin every Wednesday.  04/08/19   [provider]  pantoprazole (PROTONIX) 20 MG tablet Take 20 mg by mouth daily. 02/16/20   [provider]  polyethylene glycol (MIRALAX / GLYCOLAX) 17 g packet Take 17 g by mouth daily as needed for mild constipation. 06/20/20   Kathie Dike, MD  potassium chloride SA (KLOR-CON) 20 MEQ tablet Take 20 mEq by mouth daily. 04/17/20   [provider]  predniSONE (DELTASONE) 10 MG tablet Take 40mg  po daily for 2 days then 30mg  daily for 2 days then 20mg  daily for 2 days then 10mg  daily for 2 days then stop 06/20/20   Kathie Dike, MD  sertraline (ZOLOFT) 50 MG tablet Take 50 mg by mouth at bedtime.     [provider]  tamsulosin (FLOMAX) 0.4 MG CAPS capsule Take 0.4 mg by mouth daily.    [provider]  torsemide (DEMADEX) 20 MG tablet Take 2 tablets (40 mg total) by mouth 2 (two) times daily. 06/20/20   Kathie Dike, MD  traMADol (ULTRAM) 50 MG tablet Take 50 mg by mouth 3 (three) times daily as needed for moderate pain.  03/29/20   [provider]  warfarin (COUMADIN) 2.5 MG tablet Take 2 tablets (5 mg) tonight at 6 PM.  Go to your PCP for INR tomorrow. Patient taking differently: Take 2.5-5 mg by mouth See admin instructions. Mon,Wed,Fri take 5mg   2.5mg  all other days 05/17/19   Mercy Riding, MD    Allergies    Penicillins, Ciprofloxacin, and Ibuprofen  Review of Systems   Review of Systems  Constitutional: Negative for appetite change and fatigue.  HENT: Negative for congestion, ear discharge and sinus pressure.   Eyes: Negative for discharge.  Respiratory: Positive for shortness of breath. Negative for cough.     Cardiovascular: Negative for chest pain.  Gastrointestinal: Negative for abdominal pain and diarrhea.  Genitourinary: Negative for frequency and hematuria.  Musculoskeletal: Negative for back pain.       Edema in legs  Skin: Negative for rash.  Neurological: Negative for seizures and headaches.  Psychiatric/Behavioral: Negative for hallucinations.  Physical Exam Updated Vital Signs BP 122/71 (BP Location: Right Wrist)    Pulse 78    Temp 98.1 F (36.7 C) (Oral)    Resp 14    Ht 4\' 10"  (1.473 m)    Wt 96.9 kg    SpO2 95%    BMI 44.65 kg/m   Physical Exam Vitals and nursing note reviewed.  Constitutional:      Appearance: She is well-developed.  HENT:     Head: Normocephalic.     Nose: Nose normal.  Eyes:     General: No scleral icterus.    Conjunctiva/sclera: Conjunctivae normal.  Neck:     Thyroid: No thyromegaly.  Cardiovascular:     Rate and Rhythm: Normal rate and regular rhythm.     Heart sounds: No murmur heard.  No friction rub. No gallop.   Pulmonary:     Breath sounds: No stridor. No wheezing or rales.  Chest:     Chest wall: No tenderness.  Abdominal:     General: There is no distension.     Tenderness: There is no abdominal tenderness. There is no rebound.  Musculoskeletal:        General: Normal range of motion.     Cervical back: Neck supple.     Comments: 3+ edema all the way up her legs  Lymphadenopathy:     Cervical: No cervical adenopathy.  Skin:    Findings: No erythema or rash.  Neurological:     Mental Status: She is alert and oriented to person, place, and time.     Motor: No abnormal muscle tone.     Coordination: Coordination normal.  Psychiatric:        Behavior: Behavior normal.     ED Results / Procedures / Treatments   Labs (all labs ordered are listed, but only abnormal results are displayed) Labs Reviewed  CBC WITH DIFFERENTIAL/PLATELET - Abnormal; Notable for the following components:      Result Value   RDW 16.2 (*)     Neutro Abs 9.0 (*)    Abs Immature Granulocytes 0.10 (*)    All other components within normal limits  COMPREHENSIVE METABOLIC PANEL - Abnormal; Notable for the following components:   Sodium 134 (*)    Chloride 91 (*)    CO2 33 (*)    Glucose, Bld 473 (*)    Calcium 7.9 (*)    Total Protein 6.3 (*)    Albumin 2.9 (*)    Alkaline Phosphatase 173 (*)    All other components within normal limits  BRAIN NATRIURETIC PEPTIDE - Abnormal; Notable for the following components:   B Natriuretic Peptide 536.0 (*)    All other components within normal limits  RESPIRATORY PANEL BY RT PCR (FLU A&B, COVID)    EKG EKG Interpretation  Date/Time:  Wednesday June 25 2020 14:35:13 EDT Ventricular Rate:  79 PR Interval:    QRS Duration: 104 QT Interval:  435 QTC Calculation: 499 R Axis:   50 Text Interpretation: Sinus rhythm Probable left atrial enlargement Low voltage, extremity and precordial leads Repol abnrm suggests ischemia, anterolateral Baseline wander in lead(s) II V2 V5 increased inferior ST depressions compared with prior 10/21 Confirmed by Aletta Edouard [phone]) on 06/25/2020 2:38:05 PM   Radiology DG Chest Port 1 View  Result Date: 06/25/2020 CLINICAL DATA:  Shortness of breath and bilateral lower extremity swelling. EXAM: PORTABLE CHEST 1 VIEW COMPARISON:  June 15, 2020 FINDINGS: A tracheostomy tube is in place. Multiple sternal wires  and vascular clips are seen. Moderate to marked severity diffusely increased interstitial lung markings are present. Radiopaque surgical sutures are seen overlying the lateral aspect of the right lung base. There is no evidence of a pleural effusion or pneumothorax. The cardiac silhouette is markedly enlarged and unchanged in size. Degenerative changes seen throughout the thoracic spine. IMPRESSION: Stable cardiomegaly with moderate to marked severity interstitial edema and/or chronic interstitial lung disease. Electronically Signed   By: Virgina Norfolk M.D.   On: 06/25/2020 16:44    Procedures Procedures (including critical care time)  Medications Ordered in ED Medications  furosemide (LASIX) injection 80 mg (80 mg Intravenous Given 06/25/20 1729)   CRITICAL CARE Performed by: Milton Ferguson Total critical care time: 40 minutes Critical care time was exclusive of separately billable procedures and treating other patients. Critical care was necessary to treat or prevent imminent or life-threatening deterioration. Critical care was time spent personally by me on the following activities: development of treatment plan with patient and/or surrogate as well as nursing, discussions with consultants, evaluation of patient's response to treatment, examination of patient, obtaining history from patient or surrogate, ordering and performing treatments and interventions, ordering and review of laboratory studies, ordering and review of radiographic studies, pulse oximetry and re-evaluation of patient's condition.  ED Course  I have reviewed the triage vital signs and the nursing notes.  Pertinent labs & imaging results that were available during my care of the patient were reviewed by me and considered in my medical decision making (see chart for details).    MDM Rules/Calculators/A&P                          Patient in congestive heart failure.  To be admitted to medicine      This patient presents to the ED for concern of swelling in legs, this involves an extensive number of treatment options, and is a complaint that carries with it a high risk of complications and morbidity.  The differential diagnosis includes heart failure   Lab Tests:   I Ordered, reviewed, and interpreted labs, which included CBC and chemistries and BNP.  Patient elevated glucose elevated BNP and low protein  Medicines ordered:   I ordered medication Lasix for diuresis  Imaging Studies ordered:   I ordered imaging studies which included chest  x-ray  I independently visualized and interpreted imaging which showed heart failure  Additional history obtained:   Additional history obtained from records  Previous records obtained and reviewed.  Consultations Obtained:   I consulted hospitalist and discussed lab and imaging findings  Reevaluation:  After the interventions stated above, I reevaluated the patient and found no change  Critical Interventions:     Final Clinical Impression(s) / ED Diagnoses Final diagnoses:  Anasarca    Rx / DC Orders ED Discharge Orders    None       Milton Ferguson, MD 06/25/20 1827

## 2020-06-26 DIAGNOSIS — I5043 Acute on chronic combined systolic (congestive) and diastolic (congestive) heart failure: Secondary | ICD-10-CM

## 2020-06-26 LAB — GLUCOSE, CAPILLARY
Glucose-Capillary: 107 mg/dL — ABNORMAL HIGH (ref 70–99)
Glucose-Capillary: 131 mg/dL — ABNORMAL HIGH (ref 70–99)
Glucose-Capillary: 290 mg/dL — ABNORMAL HIGH (ref 70–99)
Glucose-Capillary: 192 mg/dL — ABNORMAL HIGH (ref 70–99)

## 2020-06-26 LAB — BASIC METABOLIC PANEL
Anion gap: 11 (ref 5–15)
BUN: 18 mg/dL (ref 6–20)
CO2: 35 mmol/L — ABNORMAL HIGH (ref 22–32)
Calcium: 8.4 mg/dL — ABNORMAL LOW (ref 8.9–10.3)
Chloride: 93 mmol/L — ABNORMAL LOW (ref 98–111)
Creatinine, Ser: 0.63 mg/dL (ref 0.44–1.00)
GFR, Estimated: >60 mL/min (ref >60)
Glucose, Bld: 140 mg/dL — ABNORMAL HIGH (ref 70–99)
Potassium: 3.6 mmol/L (ref 3.5–5.1)
Sodium: 139 mmol/L (ref 135–145)

## 2020-06-26 LAB — PROTIME-INR
INR: 1.8 — ABNORMAL HIGH (ref 0.8–1.2)
Prothrombin Time: 20.5 seconds — ABNORMAL HIGH (ref 11.4–15.2)

## 2020-06-26 MED ORDER — WARFARIN SODIUM 7.5 MG PO TABS
7.5000 mg | ORAL_TABLET | Freq: Once | ORAL | Status: AC
  Administered 2020-06-26: 7.5 mg via ORAL
  Filled 2020-06-26: qty 1

## 2020-06-26 MED ORDER — FUROSEMIDE 10 MG/ML IJ SOLN
60.0000 mg | Freq: Three times a day (TID) | INTRAMUSCULAR | Status: DC
  Administered 2020-06-26 – 2020-06-27 (×3): 60 mg via INTRAVENOUS
  Filled 2020-06-26 (×3): qty 6

## 2020-06-26 MED ORDER — PANTOPRAZOLE SODIUM 40 MG PO TBEC
40.0000 mg | DELAYED_RELEASE_TABLET | Freq: Every day | ORAL | Status: DC
  Administered 2020-06-26 – 2020-07-07 (×12): 40 mg via ORAL
  Filled 2020-06-26 (×13): qty 1

## 2020-06-26 NOTE — Telephone Encounter (Signed)
Patient currently admitted

## 2020-06-26 NOTE — Progress Notes (Signed)
ANTICOAGULATION CONSULT NOTE - Initial Consult  Pharmacy Consult for warfarin Indication: lupus anticoagulant and hx VTE  Patient Measurements: Height: 4\' 10"  (147.3 cm) Weight: 99 kg (218 lb 4.1 oz) IBW/kg (Calculated) : 40.9  Vital Signs: Temp: 98.3 F (36.8 C) (10/21 0558) BP: 122/88 (10/21 0558) Pulse Rate: 77 (10/21 0558)  Labs: Recent Labs    06/25/20 1633 06/26/20 0337  HGB 12.5  --   HCT 39.2  --   PLT 260  --   LABPROT 20.2* 20.5*  INR 1.8* 1.8*  CREATININE 0.78 0.63    Estimated Creatinine Clearance: 81.3 mL/min (by C-G formula based on SCr of 0.63 mg/dL).  Assessment: 64 yof who presents to the ED with increasing SOB and bilateral leg swelling. She was recently discharged from the hospital on 10/15. She takes warfarin PTA for lupus anticoagulant and hx VTE. Pharmacy consulted to dose warfarin while inpatient.   Home dose:  2.5 mg daily except 5 mg MWF-->last dose 10/20  06/26/20 1300 update INR: 1.8-->below therapeutic goal range (extra warfarin 2.5mg  dose not given as ordered on 06/25/20 at 2145) CBC:  H/H: 12.5/39.2   Plates: 260  Goal of Therapy:  INR 2-3 Monitor platelets by anticoagulation protocol: Yes   Plan:  Give warfarin 7.5 mg po x1 dose tonight Daily INR and every other day CBC Monitor for signs and symptoms  of bleeding   Thank you for involving pharmacy in this patient's care.  [name], Pharm. D. Clinical Pharmacist 06/26/2020 12:50 PM

## 2020-06-26 NOTE — Progress Notes (Signed)
PROGRESS NOTE    Theresa [name]  SWH:[id number] DOB: Apr 07, 1966 DOA: 06/25/2020 PCP: Monico Blitz, MD   Brief Narrative:  Per HPI: Theresa [name] is a 54 y.o. female with medical history significant for chronic respiratory failure on 4 L, systolic and diastolic CHF, coronary artery disease, DVT, diabetes mellitus, hypertension, psoriasis. Patient was seen today by advanced home care nurse.  It was found that patient had gained 14 pounds since she was recently discharged from the hospital, patient was also having increasing difficulty breathing, with bilateral lower extremity swelling and bloating.  Patient was referred to the ED. Patient reports compliance with her torsemide 40mg  BID, and denies dietary indiscretion. Patient has a trach.  She reports productive cough, wheezing since yesterday.  No chest pain.  Recent hospitalization 10/9-10/15, for severe sepsis due to E. coli bacteremia, cholecystitis, lactic acid peaked at 2.8, she was treated with ceftriaxone and metronidazole, and transitioned over to Augmentin on discharge.  General surgery recommended nonoperative management cholecystitis, as patient is not a good surgical candidate.  She was also managed for acute on chronic hypoxic respiratory failure, and decompensated CHF, COPD exacerbation, requiring 10 L of oxygen, she was discharged on her baseline 4 L.  10/21: Patient admitted with acute on chronic combined diastolic and systolic heart failure with acute on chronic hypoxemic respiratory failure.  Urine output not sufficient and seen by cardiology with plans to increase Lasix to 60 IV 3 times daily and continue to follow diuresis.  She remains on 10 L via trach collar this morning and is still quite short of breath.  Assessment & Plan:   Principal Problem:   Acute on chronic combined systolic and diastolic HF (heart failure) (HCC) Active Problems:   DM (diabetes mellitus) ([place])   Essential hypertension   OSA (obstructive  sleep apnea)   Lupus anticoagulant syndrome (HCC)   CAD (coronary artery disease) of artery bypass graft: PTCA/DES to distal body VG to PDA 10/29/15   Tracheostomy in place [facility]), chronic since 2002   Acute on chronic respiratory failure with hypoxia ([place])   Uncontrolled type 2 diabetes mellitus with hyperglycemia, with long-term current use of insulin (HCC)   Acute on chronic respiratory failure with hypoxia (HCC)   Chronic respiratory insufficiency   DVT (deep venous thrombosis) (HCC)  Acute on chronic hypoxemic respiratory failure secondary to acute on chronic combined diastolic and systolic heart failure -Only on 10 L oxygen via trach collar with 4 L at baseline -She reports compliance with 40 mg torsemide twice daily and did not have sufficient urine output with IV Lasix 60 mg twice daily, therefore this has been increased to 3 times daily dosing per cardiology -Follow daily BMP, daily weights, strict I's and O's -Appreciate cardiology evaluation  COPD -Continue on duo nebs and mucolytic's -Flutter valve  Lupus anticoagulant syndrome/history of DVT on chronic anticoagulation -Continue warfarin with pharmacy to dose, PT/INR  History of CABG -Continue warfarin  Uncontrolled type 2 diabetes likely related to steroid-induced hyperglycemia, improving -Continue SSI while holding Ozempic -Continue home [place]  Depression -Continue home sertraline and lorazepam   DVT prophylaxis:Warfarin Code Status: Full code Family Communication: None at bedside Disposition Plan:  Status is: Inpatient  Remains inpatient appropriate because:IV treatments appropriate due to intensity of illness or inability to take PO and Inpatient level of care appropriate due to severity of illness   Dispo: The patient is from: Home              Anticipated d/c  is to: Home              Anticipated d/c date is: 2 days              Patient currently is not medically stable to d/c.    Consultants:    Cardiology  Procedures:   See below  Antimicrobials:   None   Subjective: Patient seen and evaluated today with no new acute complaints or concerns. No acute concerns or events noted overnight.  She continues to have some ongoing shortness of breath.  Objective: Vitals:   06/26/20 0347 06/26/20 0433 06/26/20 0500 06/26/20 0558  BP: 99/75   122/88  Pulse: 79   77  Resp: (!) 22   (!) 22  Temp: 98.4 F (36.9 C)   98.3 F (36.8 C)  TempSrc:      SpO2: (!) 89% 96%  93%  Weight:   99 kg   Height:       No intake or output data in the 24 hours ending 06/26/20 0658 Filed Weights   06/25/20 1434 06/26/20 0500  Weight: 96.9 kg 99 kg    Examination:  General exam: Appears calm and comfortable, obese Respiratory system: Clear to auscultation. Respiratory effort normal.  Trach collar 10 L present with tracheostomy. Cardiovascular system: S1 & S2 heard, RRR.  Gastrointestinal system: Abdomen is nondistended, soft and nontender.  Central nervous system: Alert and awake Extremities: Bilateral edema noted Skin: No rashes, lesions or ulcers Psychiatry: Flat affect    Data Reviewed: I have personally reviewed following labs and imaging studies  CBC: Recent Labs  Lab 06/20/20 0558 06/25/20 1633  WBC 10.0 10.3  NEUTROABS  --  9.0*  HGB 12.9 12.5  HCT 39.1 39.2  MCV 94.4 96.3  PLT 287 595   Basic Metabolic Panel: Recent Labs  Lab 06/20/20 0558 06/20/20 0559 06/20/20 1552 06/25/20 1633 06/26/20 0337  NA  --  137 137 134* 139  K  --  2.6* 4.0 3.5 3.6  CL  --  98 89* 91* 93*  CO2  --  33* 33* 33* 35*  GLUCOSE  --  84 424* 473* 140*  BUN  --  20 21* 17 18  CREATININE  --  0.77 1.03* 0.78 0.63  CALCIUM  --  8.5* 8.8* 7.9* 8.4*  MG 2.1  --   --   --   --    GFR: Estimated Creatinine Clearance: 81.3 mL/min (by C-G formula based on SCr of 0.63 mg/dL). Liver Function Tests: Recent Labs  Lab 06/25/20 1633  AST 17  ALT 29  ALKPHOS 173*  BILITOT 0.9  PROT  6.3*  ALBUMIN 2.9*   No results for input(s): LIPASE, AMYLASE in the last 168 hours. No results for input(s): AMMONIA in the last 168 hours. Coagulation Profile: Recent Labs  Lab 06/20/20 0558 06/25/20 1633 06/26/20 0337  INR 2.2* 1.8* 1.8*   Cardiac Enzymes: No results for input(s): CKTOTAL, CKMB, CKMBINDEX, TROPONINI in the last 168 hours. BNP (last 3 results) No results for input(s): PROBNP in the last 8760 hours. HbA1C: No results for input(s): HGBA1C in the last 72 hours. CBG: Recent Labs  Lab 06/19/20 2115 06/20/20 0814 06/20/20 1143 06/20/20 1650 06/25/20 2156  GLUCAP 338* 77 218* 356* 392*   Lipid Profile: No results for input(s): CHOL, HDL, LDLCALC, TRIG, CHOLHDL, LDLDIRECT in the last 72 hours. Thyroid Function Tests: No results for input(s): TSH, T4TOTAL, FREET4, T3FREE, THYROIDAB in the last 72 hours. Anemia Panel:  No results for input(s): VITAMINB12, FOLATE, FERRITIN, TIBC, IRON, RETICCTPCT in the last 72 hours. Sepsis Labs: No results for input(s): PROCALCITON, LATICACIDVEN in the last 168 hours.  Recent Results (from the past 240 hour(s))  Respiratory Panel by RT PCR (Flu A&B, Covid) - Nasopharyngeal Swab     Status: None   Collection Time: 06/25/20  4:07 PM   Specimen: Nasopharyngeal Swab  Result Value Ref Range Status   SARS Coronavirus 2 by RT PCR NEGATIVE NEGATIVE Final    Comment: (NOTE) SARS-CoV-2 target nucleic acids are NOT DETECTED.  The SARS-CoV-2 RNA is generally detectable in upper respiratoy specimens during the acute phase of infection. The lowest concentration of SARS-CoV-2 viral copies this assay can detect is 131 copies/mL. A negative result does not preclude SARS-Cov-2 infection and should not be used as the sole basis for treatment or other patient management decisions. A negative result may occur with  improper specimen collection/handling, submission of specimen other than nasopharyngeal swab, presence of viral mutation(s)  within the areas targeted by this assay, and inadequate number of viral copies (<131 copies/mL). A negative result must be combined with clinical observations, patient history, and epidemiological information. The expected result is Negative.  Fact Sheet for Patients:  PinkCheek.be  Fact Sheet for Healthcare Providers:  GravelBags.it  This test is no t yet approved or cleared by the [place] FDA and  has been authorized for detection and/or diagnosis of SARS-CoV-2 by FDA under an Emergency Use Authorization (EUA). This EUA will remain  in effect (meaning this test can be used) for the duration of the COVID-19 declaration under Section 564(b)(1) of the Act, 21 U.S.C. section 360bbb-3(b)(1), unless the authorization is terminated or revoked sooner.     Influenza A by PCR NEGATIVE NEGATIVE Final   Influenza B by PCR NEGATIVE NEGATIVE Final    Comment: (NOTE) The Xpert Xpress SARS-CoV-2/FLU/RSV assay is intended as an aid in  the diagnosis of influenza from Nasopharyngeal swab specimens and  should not be used as a sole basis for treatment. Nasal washings and  aspirates are unacceptable for Xpert Xpress SARS-CoV-2/FLU/RSV  testing.  Fact Sheet for Patients: PinkCheek.be  Fact Sheet for Healthcare Providers: GravelBags.it  This test is not yet approved or cleared by the [place] FDA and  has been authorized for detection and/or diagnosis of SARS-CoV-2 by  FDA under an Emergency Use Authorization (EUA). This EUA will remain  in effect (meaning this test can be used) for the duration of the  Covid-19 declaration under Section 564(b)(1) of the Act, 21  U.S.C. section 360bbb-3(b)(1), unless the authorization is  terminated or revoked. Performed at [facility], [address], [place], [place] [address]          Radiology Studies: [facility] Chest [facility] 1  View  Result Date: 06/25/2020 CLINICAL DATA:  Shortness of breath and bilateral lower extremity swelling. EXAM: PORTABLE CHEST 1 VIEW COMPARISON:  June 15, 2020 FINDINGS: A tracheostomy tube is in place. Multiple sternal wires and vascular clips are seen. Moderate to marked severity diffusely increased interstitial lung markings are present. Radiopaque surgical sutures are seen overlying the lateral aspect of the right lung base. There is no evidence of a pleural effusion or pneumothorax. The cardiac silhouette is markedly enlarged and unchanged in size. Degenerative changes seen throughout the thoracic spine. IMPRESSION: Stable cardiomegaly with moderate to marked severity interstitial edema and/or chronic interstitial lung disease. Electronically Signed   By: Virgina Norfolk M.D.   On: 06/25/2020 16:44  Scheduled Meds: . furosemide  60 mg Intravenous Q12H  . gabapentin  900 mg Oral QHS  . guaiFENesin-dextromethorphan  10 mL Oral Q8H  . insulin aspart  0-15 Units Subcutaneous TID WC  . insulin aspart  0-5 Units Subcutaneous QHS  . insulin glargine  50 Units Subcutaneous QHS  . losartan  25 mg Oral Daily  . metoprolol succinate  25 mg Oral Daily  . pantoprazole  20 mg Oral Daily  . potassium chloride  40 mEq Oral BID  . [START ON 06/27/2020] potassium chloride SA  20 mEq Oral Daily  . sertraline  50 mg Oral QHS  . tamsulosin  0.4 mg Oral Daily  . warfarin  2.5 mg Oral NOW  . Warfarin - Pharmacist Dosing Inpatient   Does not apply [id number]    LOS: 1 day    Time spent: 35 minutes    Kentrell Hallahan Darleen Crocker, DO [facility]  If 7PM-7AM, please contact night-coverage [email or web address] 06/26/2020, 6:58 AM

## 2020-06-26 NOTE — Progress Notes (Signed)
Notified RT of pt sat 84%. Increased O2 to 10L 90% FIO2, pt sat 89%. RT advised someone would be around to suction and reevaluate

## 2020-06-26 NOTE — TOC Initial Note (Signed)
Transition of Care [facility]) - Initial/Assessment Note    Patient Details  Name: Theresa [name] MRN: [id number] Date of Birth: 01/19/1966  [facility] CM/SW Contact:    Iona Beard, [place] Phone Number: 06/26/2020, 2:35 PM  Clinical Narrative:                 Pt admitted for Acute on chronic combined systolic and diastolic HF. CSW completed assessment and CHF screen with pts husband due to pt being on 10L O2 through trach collar. Pt lives at home with her husband and 2 grandchildren. Pt is able to complete ADL's independently and drives herself. Pt is currently active with [facility] for [facility] PT and RN. Pt has a cane and rollator at home to use when needed. CSW spoke with Maylin with [facility] to confirm that pt was active and will be able to resume services at D/C. Per [place] new orders are needed at D/C for [facility] services.   CSW completed CHF screen with pts husband. Pt weighs daily and attempts to follow a heart healthy diet. Pt takes all medications as prescribed. Pts cardiologist is Dr. Percival Spanish. TOC to follow.   Expected Discharge Plan: [place] Barriers to Discharge: Continued Medical Work up   Patient Goals and CMS Choice Patient states their goals for this hospitalization and ongoing recovery are:: Return home with [facility]   Choice offered to / list presented to : NA  Expected Discharge Plan and Services Expected Discharge Plan: [place] In-house Referral: Clinical Social Work Discharge Planning Services: NA Post Acute Care Choice: [place] arrangements for the past 2 months: Single Family Home                 DME Arranged: N/A DME Agency: NA                  Prior Living Arrangements/Services Living arrangements for the past 2 months: Single Family Home Lives with:: Spouse, Other (Comment) (2 granchildren)   Do you feel safe going back to the place where you live?: Yes      Need for Family Participation in Patient  Care: Yes (Comment) (Pt on 10L O2 through Trach collar)   Current home services: Home PT, Home RN Criminal Activity/Legal Involvement Pertinent to Current Situation/Hospitalization: No - Comment as needed  Activities of Daily Living Home Assistive Devices/Equipment: Cane (specify quad or straight), Walker (specify type), Shower chair with back, Bedside commode/3-in-1 ADL Screening (condition at time of admission) Patient's cognitive ability adequate to safely complete daily activities?: Yes Is the patient deaf or have difficulty hearing?: No Does the patient have difficulty seeing, even when wearing glasses/contacts?: No Does the patient have difficulty concentrating, remembering, or making decisions?: No Patient able to express need for assistance with ADLs?: Yes Does the patient have difficulty dressing or bathing?: Yes Independently performs ADLs?: No Communication: Independent Dressing (OT): Needs assistance Is this a change from baseline?: Pre-admission baseline Grooming: Needs assistance Is this a change from baseline?: Pre-admission baseline Feeding: Independent Bathing: Needs assistance Is this a change from baseline?: Pre-admission baseline Toileting: Needs assistance Is this a change from baseline?: Pre-admission baseline In/Out Bed: Independent Walks in Home: Independent with device (comment) Does the patient have difficulty walking or climbing stairs?: Yes Weakness of Legs: Both Weakness of Arms/Hands: None  Permission Sought/Granted                  Emotional Assessment  Alcohol / Substance Use: Not Applicable Psych Involvement: No (comment)  Admission diagnosis:  Anasarca [R60.1] Acute on chronic combined systolic and diastolic CHF (congestive heart failure) ([place]) [I50.43] Patient Active Problem List   Diagnosis Date Noted  . Sepsis due to Escherichia coli (E. coli) ([place]) 06/16/2020  . Bacteremia due to Gram-negative bacteria 06/16/2020  .  Severe sepsis ([place]) 06/15/2020  . Biliary colic 91/69/4503  . Abdominal pain 06/15/2020  . Transaminitis 06/15/2020  . Sepsis due to undetermined organism ([place]) 06/15/2020  . Systolic and diastolic CHF, chronic ([place]) 06/15/2020  . CHF (congestive heart failure) ([place]) 04/18/2020  . Acute respiratory failure ([place]) 04/17/2020  . Chronic venous insufficiency 06/19/2019  . Cholelithiasis 05/07/2019  . Adult body mass index 50.0-59.9 ([place]) 03/22/2019  . Anxiety 03/22/2019  . Low back pain 03/22/2019  . Cigarette nicotine dependence without complication 88/82/8003  . History of vitamin D deficiency 03/22/2019  . Insomnia 03/22/2019  . Knee pain 03/22/2019  . Localized, primary osteoarthritis of lower leg, unspecified laterality 03/22/2019  . Neck pain 03/22/2019  . Psoriasis 03/22/2019  . Thrombophlebitis of deep veins of lower extremity ([place]) 03/22/2019  . DVT (deep venous thrombosis) ([place]) 03/22/2019  . Fracture of humeral shaft, left, closed 03/22/2019  . Chronic respiratory insufficiency 01/19/2019  . CAD (coronary artery disease) 01/15/2019  . Educated about COVID-19 virus infection 01/12/2019  . Acute on chronic combined systolic and diastolic HF (heart failure) ([place]) 01/12/2019  . Acute on chronic respiratory failure with hypoxia ([place]) 10/24/2018  . Pulmonary hypertension, unspecified ([place])   . Acute on chronic respiratory failure with hypoxia ([place]) 08/01/2017  . Uncontrolled type 2 diabetes mellitus with hyperglycemia, with long-term current use of insulin ([place]) 08/01/2017  . Dyspnea 07/31/2017  . Nausea & vomiting   . Acute on chronic congestive heart failure ([place])   . Acute respiratory failure with hypoxia ([place]) 01/22/2016  . Hyperglycemia due to diabetes mellitus ([place]) 01/22/2016  . CAD (coronary artery disease) of artery bypass graft: PTCA/DES to distal body VG to PDA 10/29/15 11/03/2015  . Tracheostomy in place [facility]), chronic since 2002 11/03/2015  . Hypokalemia 11/03/2015   . Lupus anticoagulant syndrome ([place])   . Chronic diastolic CHF (congestive heart failure) ([place])   . Respiratory failure ([place])   . Coronary artery disease involving coronary bypass graft of native heart without angina pectoris   . OSA (obstructive sleep apnea)   . COPD exacerbation ([place])   . Tobacco abuse   . DM (diabetes mellitus) ([place]) 06/11/2009  . Hyperlipidemia LDL goal <70 06/11/2009  . Acute thromboembolism of deep veins of lower extremity ([place]) 06/11/2009  . History of CABG 06/11/2009  . Essential hypertension 05/27/2009  . CAD, NATIVE VESSEL 05/27/2009   PCP:  Monico Blitz, MD Pharmacy:   [facility] [address] [place] [address] Phone: [phone] Fax: [phone]     Social Determinants of Health (SDOH) Interventions    Readmission Risk Interventions Readmission Risk Prevention Plan 05/17/2019  Transportation Screening Complete  PCP or Specialist Appt within 3-5 Days Complete  HRI or [place] Complete  Social Work Consult for [place] Planning/Counseling Complete  Palliative Care Screening Not Applicable  Medication Review Press photographer) Complete  Some recent data might be hidden

## 2020-06-26 NOTE — Consult Note (Signed)
Cardiology Consultation:   Patient ID: Theresa [name] MRN: [id number]; DOB: [birth year]  Admit date: 06/25/2020 Date of Consult: 06/26/2020  Primary Care Provider: Monico Blitz, MD [facility] Cardiologist: [name], MD  [place] Electrophysiologist:  None    Patient Profile:   Theresa [name] is a 54 y.o. female with a hx of chronic combined systyolic/diastolic HF who is being seen today for the evaluation of SOB and leg edema at the request of Dr Manuella Ghazi.  History of Present Illness:   Ms. Theresa [name] 54 yo female history of CAD with prior CABG in 2007, STEMI 10/2015 and had DES to SVG-PDA, chronic combined systolic/diastolic HF LVEF 09-98%, COPD, chronic resp failure with trach, lupus anticoag disorder with DVT on coumadin, HTN, HL, DM2   Last ischemic evaluation was a right/left heart cath in 03/2018 during admission for chest pain and CHF. Cath showed severe native CAD with patent LIMA to LAD but total occlusion of SVG to RCA. RCA territory was being supplied by collaterals from LAD and circumflex. Also noted to have moderate to severe pulmonary hypertension.   Recent hospitalization 10/9-10/15, for severe sepsis due to E. coli bacteremia, cholecystitis, lactic acid peaked at 2.8, she was treated with ceftriaxone and metronidazole, and transitioned over to Augmentin on discharge.  General surgery recommended nonoperative management cholecystitis, as patient is not a good surgical candidate.  She was also managed for acute on chronic hypoxic respiratory failure, and decompensated CHF, COPD exacerbation, requiring 10 L of oxygen, she was discharged on her baseline 4 L.    Presented with increased SOB, 14 lbs weight gain, LE edema. She reports compliance with her torsemide at home. Reports has been limiting sodium intake.  WBC 10 Hgb 12.5 Plt 260 K 3.5 Cr 0.78 BUN 17 BNP 536 INR 1.8  CXR +pulm edema EKG SR, chronic lateral ST/T changes COVID neg  04/2020 echo LVEF 40-45%, grade  III DDx, PASP 37   Past Medical History:  Diagnosis Date  . Acute kidney failure with lesion of tubular necrosis (HCC)   . Acute systolic heart failure ([place])   . CAD (coronary artery disease)    a. s/p prior PCI. b. CABG 2007 at [facility] in [place] 2007. c. inferior STEMI 10/2015 s/p DES to dSVG-PDA.  . Cardiac arrest ([place])   . Cervical cancer ([place])   . Chronic diastolic CHF (congestive heart failure) ([place])   . Chronic respiratory failure ([place])    s/p tracheostomy 2002  . Chronic RUQ pain   . COPD (chronic obstructive pulmonary disease) ([place])   . Diabetes mellitus ([place])   . DVT (deep venous thrombosis) ([place])   . Endometriosis   . History of gallstones 01/2016   seen on Ultrasound  . History of HIDA scan 11/2016   normal  . HTN (hypertension)   . Hyperlipidemia   . Lupus anticoagulant disorder (HCC)    on coumadin  . Morbid obesity ([place])   . Psoriasis   . ST elevation (STEMI) myocardial infarction involving right coronary artery ([place]) 10/29/15   stent to VG to PDA  . Toe fracture, right 03/29/2018  . Tracheostomy in place [facility]), chronic since 2002 11/03/2015  . Tracheostomy status [facility])     Past Surgical History:  Procedure Laterality Date  . CARDIAC CATHETERIZATION N/A 10/29/2015   Procedure: Left Heart Cath and Cors/Grafts Angiography;  Surgeon: Burnell Blanks, MD;  Location: [place] CV LAB;  Service: Cardiovascular;  Laterality: N/A;  . CARDIAC CATHETERIZATION  10/29/2015  Procedure: Coronary Stent Intervention;  Surgeon: Burnell Blanks, MD;  Location: [place] [facility];  Service: Cardiovascular;;  . CAROTID STENT    . CESAREAN SECTION WITH BILATERAL TUBAL LIGATION    . CORONARY ARTERY BYPASS GRAFT  2007   2V  . IR GASTROSTOMY TUBE MOD SED  11/13/2018  . RIGHT/LEFT HEART CATH AND CORONARY/GRAFT ANGIOGRAPHY N/A 03/24/2018   Procedure: RIGHT/LEFT HEART CATH AND CORONARY/GRAFT ANGIOGRAPHY;  Surgeon: Belva Crome, MD;  Location: [place] [facility]  LAB;  Service: Cardiovascular;  Laterality: N/A;  . TRACHEOSTOMY        Inpatient Medications: Scheduled Meds: . furosemide  60 mg Intravenous Q12H  . gabapentin  900 mg Oral QHS  . guaiFENesin-dextromethorphan  10 mL Oral Q8H  . insulin aspart  0-15 Units Subcutaneous TID WC  . insulin aspart  0-5 Units Subcutaneous QHS  . insulin glargine  50 Units Subcutaneous QHS  . losartan  25 mg Oral Daily  . metoprolol succinate  25 mg Oral Daily  . pantoprazole  40 mg Oral Daily  . [START ON 06/27/2020] potassium chloride SA  20 mEq Oral Daily  . sertraline  50 mg Oral QHS  . tamsulosin  0.4 mg Oral Daily  . warfarin  2.5 mg Oral NOW  . Warfarin - Pharmacist Dosing Inpatient   Does not apply q1600   Continuous Infusions:  PRN Meds: acetaminophen **OR** acetaminophen, ipratropium-albuterol, LORazepam, ondansetron **OR** ondansetron (ZOFRAN) IV, polyethylene glycol, sodium chloride flush  Allergies:    Allergies  Allergen Reactions  . Penicillins Rash    Has patient had a PCN reaction causing immediate rash, facial/tongue/throat swelling, SOB or lightheadedness with hypotension: Yes Has patient had a PCN reaction causing severe rash involving mucus membranes or skin necrosis: No Has patient had a PCN reaction that required hospitalization No Has patient had a PCN reaction occurring within the last 10 years: No If all of the above answers are "NO", then may proceed with Cephalosporin use.   REACTION: rash Pt has tolerated cefepime in the past.  . Ciprofloxacin     nausea  . Ibuprofen Rash    swelling in leg     Social History:   Social History   Socioeconomic History  . Marital status: Married    Spouse name: Not on file  . Number of children: Not on file  . Years of education: Not on file  . Highest education level: Not on file  Occupational History  . Not on file  Tobacco Use  . Smoking status: Current Every Day Smoker    Packs/day: 0.75    Types: Cigarettes     Start date: 10/23/1978  . Smokeless tobacco: Never Used  . Tobacco comment: 1/2 pack - trying to cut back   Vaping Use  . Vaping Use: Never used  Substance and Sexual Activity  . Alcohol use: No    Alcohol/week: 0.0 standard drinks  . Drug use: No  . Sexual activity: Not on file  Other Topics Concern  . Not on file  Social History Narrative  . Not on file   Social Determinants of Health   Financial Resource Strain:   . Difficulty of Paying Living Expenses: Not on file  Food Insecurity:   . Worried About Charity fundraiser in the Last Year: Not on file  . Ran Out of Food in the Last Year: Not on file  Transportation Needs:   . Lack of Transportation (Medical): Not on file  .  Lack of Transportation (Non-Medical): Not on file  Physical Activity:   . Days of Exercise per Week: Not on file  . Minutes of Exercise per Session: Not on file  Stress:   . Feeling of Stress : Not on file  Social Connections:   . Frequency of Communication with Friends and Family: Not on file  . Frequency of Social Gatherings with Friends and Family: Not on file  . Attends Religious Services: Not on file  . Active Member of Clubs or Organizations: Not on file  . Attends Archivist Meetings: Not on file  . Marital Status: Not on file  Intimate Partner Violence:   . Fear of Current or Ex-Partner: Not on file  . Emotionally Abused: Not on file  . Physically Abused: Not on file  . Sexually Abused: Not on file    Family History:    Family History  Problem Relation Age of Onset  . Hypertension Mother   . Diabetes Mother   . Hypertension Father   . Diabetes Father      ROS:  Please see the history of present illness.   All other ROS reviewed and negative.     Physical Exam/Data:   Vitals:   06/26/20 0347 06/26/20 0433 06/26/20 0500 06/26/20 0558  BP: 99/75   122/88  Pulse: 79   77  Resp: (!) 22   (!) 22  Temp: 98.4 F (36.9 C)   98.3 F (36.8 C)  TempSrc:      SpO2: (!) 89%  96%  93%  Weight:   99 kg   Height:        Intake/Output Summary (Last 24 hours) at 06/26/2020 1002 Last data filed at 06/26/2020 0900 Gross per 24 hour  Intake 240 ml  Output --  Net 240 ml   Last 3 Weights 06/26/2020 06/25/2020 06/19/2020  Weight (lbs) 218 lb 4.1 oz 213 lb 10 oz 213 lb 10 oz  Weight (kg) 99 kg 96.9 kg 96.9 kg     Body mass index is 45.62 kg/m.  General:  Well nourished, well developed, in no acute distress HEENT: normal Lymph: no adenopathy Neck: no JVD Endocrine:  No thryomegaly Vascular: No carotid bruits; FA pulses 2+ bilaterally without bruits  Cardiac:  normal S1, S2; RRR; no murmur  Lungs:  Coarse bilaterally Abd: soft, nontender, no hepatomegaly  Ext: no edema Musculoskeletal:  No deformities, BUE and BLE strength normal and equal Skin: warm and dry  Neuro:  CNs 2-12 intact, no focal abnormalities noted Psych:  Normal affect    Laboratory Data:  High Sensitivity Troponin:  No results for input(s): TROPONINIHS in the last 720 hours.   Chemistry Recent Labs  Lab 06/20/20 1552 06/25/20 1633 06/26/20 0337  NA 137 134* 139  K 4.0 3.5 3.6  CL 89* 91* 93*  CO2 33* 33* 35*  GLUCOSE 424* 473* 140*  BUN 21* 17 18  CREATININE 1.03* 0.78 0.63  CALCIUM 8.8* 7.9* 8.4*  GFRNONAA >60 >60 >60  ANIONGAP 15 10 11     Recent Labs  Lab 06/25/20 1633  PROT 6.3*  ALBUMIN 2.9*  AST 17  ALT 29  ALKPHOS 173*  BILITOT 0.9   Hematology Recent Labs  Lab 06/20/20 0558 06/25/20 1633  WBC 10.0 10.3  RBC 4.14 4.07  HGB 12.9 12.5  HCT 39.1 39.2  MCV 94.4 96.3  MCH 31.2 30.7  MCHC 33.0 31.9  RDW 16.2* 16.2*  PLT 287 260   BNP Recent Labs  Lab 06/25/20 1633  BNP 536.0*    DDimer No results for input(s): DDIMER in the last 168 hours.   Radiology/Studies:  DG Chest Port 1 View  Result Date: 06/25/2020 CLINICAL DATA:  Shortness of breath and bilateral lower extremity swelling. EXAM: PORTABLE CHEST 1 VIEW COMPARISON:  June 15, 2020  FINDINGS: A tracheostomy tube is in place. Multiple sternal wires and vascular clips are seen. Moderate to marked severity diffusely increased interstitial lung markings are present. Radiopaque surgical sutures are seen overlying the lateral aspect of the right lung base. There is no evidence of a pleural effusion or pneumothorax. The cardiac silhouette is markedly enlarged and unchanged in size. Degenerative changes seen throughout the thoracic spine. IMPRESSION: Stable cardiomegaly with moderate to marked severity interstitial edema and/or chronic interstitial lung disease. Electronically Signed   By: Virgina Norfolk M.D.   On: 06/25/2020 16:44     Assessment and Plan:   1. Acute on chronic combined systolic/diastolic HF - admitted with 14 lbs weight gain from home report (from [place] weights 5 lbs since discharge last week), LE edema. BNP in the 500s, pulm edema on cxr - she is on IV lasix 60mg  bid (got 80mg  IV x 1 yesterday, just starting 60mg  bid dosing today). Limited I/Os data thus far. Follow diuresis today, adjust diuretic accordingly - continue losartan 25, toprol 25 - subjectively she reports low urine output since admission, Cr downtrending since yesterday, increase lasix to 60mg  IV tid.       For questions or updates, please contact [place] Please consult [email or web address] for contact info under    Signed, Carlyle Dolly, MD  06/26/2020 10:02 AM

## 2020-06-26 NOTE — Plan of Care (Signed)

## 2020-06-26 NOTE — Progress Notes (Signed)
Checked on patient's O2 sat.  Patient had been on 8L on ATC.  Sat was at 87%.  Had to raise patient up to 10L 80% FIO2, now sat is 92%.  Will continue to monitor.

## 2020-06-27 ENCOUNTER — Encounter (HOSPITAL_COMMUNITY): Payer: Self-pay | Admitting: Internal Medicine

## 2020-06-27 DIAGNOSIS — I5043 Acute on chronic combined systolic (congestive) and diastolic (congestive) heart failure: Secondary | ICD-10-CM | POA: Diagnosis not present

## 2020-06-27 LAB — PROTIME-INR
INR: 1.7 — ABNORMAL HIGH (ref 0.8–1.2)
Prothrombin Time: 19.2 seconds — ABNORMAL HIGH (ref 11.4–15.2)

## 2020-06-27 LAB — CBC
HCT: 42 % (ref 36.0–46.0)
Hemoglobin: 13.2 g/dL (ref 12.0–15.0)
MCH: 30.4 pg (ref 26.0–34.0)
MCHC: 31.4 g/dL (ref 30.0–36.0)
MCV: 96.8 fL (ref 80.0–100.0)
Platelets: 263 10*3/uL (ref 150–400)
RBC: 4.34 MIL/uL (ref 3.87–5.11)
RDW: 16.4 % — ABNORMAL HIGH (ref 11.5–15.5)
WBC: 13.1 10*3/uL — ABNORMAL HIGH (ref 4.0–10.5)
nRBC: 0 % (ref 0.0–0.2)

## 2020-06-27 LAB — GLUCOSE, CAPILLARY
Glucose-Capillary: 84 mg/dL (ref 70–99)
Glucose-Capillary: 175 mg/dL — ABNORMAL HIGH (ref 70–99)
Glucose-Capillary: 163 mg/dL — ABNORMAL HIGH (ref 70–99)
Glucose-Capillary: 122 mg/dL — ABNORMAL HIGH (ref 70–99)

## 2020-06-27 LAB — BASIC METABOLIC PANEL
Anion gap: 12 (ref 5–15)
BUN: 18 mg/dL (ref 6–20)
CO2: 33 mmol/L — ABNORMAL HIGH (ref 22–32)
Calcium: 8.8 mg/dL — ABNORMAL LOW (ref 8.9–10.3)
Chloride: 92 mmol/L — ABNORMAL LOW (ref 98–111)
Creatinine, Ser: 0.64 mg/dL (ref 0.44–1.00)
GFR, Estimated: >60 mL/min (ref >60)
Glucose, Bld: 83 mg/dL (ref 70–99)
Potassium: 3.6 mmol/L (ref 3.5–5.1)
Sodium: 137 mmol/L (ref 135–145)

## 2020-06-27 LAB — PROCALCITONIN: Procalcitonin: <0.1 ng/mL

## 2020-06-27 LAB — MAGNESIUM: Magnesium: 1.9 mg/dL (ref 1.7–2.4)

## 2020-06-27 MED ORDER — FUROSEMIDE 10 MG/ML IJ SOLN
80.0000 mg | Freq: Three times a day (TID) | INTRAMUSCULAR | Status: DC
  Administered 2020-06-27 – 2020-06-28 (×5): 80 mg via INTRAVENOUS
  Filled 2020-06-27 (×6): qty 8

## 2020-06-27 MED ORDER — WARFARIN SODIUM 5 MG PO TABS
5.0000 mg | ORAL_TABLET | Freq: Once | ORAL | Status: AC
  Administered 2020-06-27: 5 mg via ORAL
  Filled 2020-06-27: qty 1

## 2020-06-27 MED ORDER — ACETYLCYSTEINE 10 % IN SOLN
2.0000 mL | Freq: Two times a day (BID) | RESPIRATORY_TRACT | Status: DC
  Filled 2020-06-27 (×7): qty 4

## 2020-06-27 MED ORDER — POTASSIUM CHLORIDE CRYS ER 20 MEQ PO TBCR
40.0000 meq | EXTENDED_RELEASE_TABLET | Freq: Every day | ORAL | Status: DC
  Administered 2020-06-28 – 2020-06-29 (×2): 40 meq via ORAL
  Filled 2020-06-27 (×3): qty 2

## 2020-06-27 MED ORDER — ACETYLCYSTEINE 20 % IN SOLN
RESPIRATORY_TRACT | Status: AC
  Filled 2020-06-27: qty 4

## 2020-06-27 NOTE — Progress Notes (Signed)
Progress Note  Patient Name: Theresa [name] Date of Encounter: 06/27/2020  [facility] Cardiologist: [name], MD   Subjective   Ongoing shortness of breath.   Inpatient Medications    Scheduled Meds: . furosemide  60 mg Intravenous TID PC  . gabapentin  900 mg Oral QHS  . insulin aspart  0-15 Units Subcutaneous TID WC  . insulin aspart  0-5 Units Subcutaneous QHS  . insulin glargine  50 Units Subcutaneous QHS  . losartan  25 mg Oral Daily  . metoprolol succinate  25 mg Oral Daily  . pantoprazole  40 mg Oral Daily  . potassium chloride SA  20 mEq Oral Daily  . sertraline  50 mg Oral QHS  . tamsulosin  0.4 mg Oral Daily  . Warfarin - Pharmacist Dosing Inpatient   Does not apply q1600   Continuous Infusions:  PRN Meds: acetaminophen **OR** acetaminophen, ipratropium-albuterol, LORazepam, ondansetron **OR** ondansetron (ZOFRAN) IV, polyethylene glycol, sodium chloride flush   Vital Signs    Vitals:   06/26/20 2019 06/26/20 2055 06/27/20 0622 06/27/20 0952  BP:  132/89 136/63   Pulse: 84 86 75   Resp: 20 (!) 22 (!) 21   Temp:  98.6 F (37 C) 98.8 F (37.1 C)   TempSrc:  Oral Oral   SpO2: 95% 94% 96% 92%  Weight:      Height:        Intake/Output Summary (Last 24 hours) at 06/27/2020 1021 Last data filed at 06/26/2020 1806 Gross per 24 hour  Intake 120 ml  Output 1200 ml  Net -1080 ml   Last 3 Weights 06/26/2020 06/25/2020 06/19/2020  Weight (lbs) 218 lb 4.1 oz 213 lb 10 oz 213 lb 10 oz  Weight (kg) 99 kg 96.9 kg 96.9 kg      Telemetry    NSR, short runs SVT - Personally Reviewed  ECG    n/a - Personally Reviewed  Physical Exam   GEN: No acute distress.   Neck: No JVD Cardiac: RRR, no murmurs, rubs, or gallops.  Respiratory: coarse bilaterally GI: Soft, nontender, non-distended  MS: 1+ bilateral LE edema; No deformity. Neuro:  Nonfocal  Psych: Normal affect   Labs    High Sensitivity Troponin:  No results for input(s):  TROPONINIHS in the last 720 hours.    Chemistry Recent Labs  Lab 06/25/20 1633 06/26/20 0337 06/27/20 0346  NA 134* 139 137  K 3.5 3.6 3.6  CL 91* 93* 92*  CO2 33* 35* 33*  GLUCOSE 473* 140* 83  BUN 17 18 18   CREATININE 0.78 0.63 0.64  CALCIUM 7.9* 8.4* 8.8*  PROT 6.3*  --   --   ALBUMIN 2.9*  --   --   AST 17  --   --   ALT 29  --   --   ALKPHOS 173*  --   --   BILITOT 0.9  --   --   GFRNONAA >60 >60 >60  ANIONGAP 10 11 12      Hematology Recent Labs  Lab 06/25/20 1633 06/27/20 0346  WBC 10.3 13.1*  RBC 4.07 4.34  HGB 12.5 13.2  HCT 39.2 42.0  MCV 96.3 96.8  MCH 30.7 30.4  MCHC 31.9 31.4  RDW 16.2* 16.4*  PLT 260 263    BNP Recent Labs  Lab 06/25/20 1633  BNP 536.0*     DDimer No results for input(s): DDIMER in the last 168 hours.   Radiology    DG Chest  Port 1 View  Result Date: 06/25/2020 CLINICAL DATA:  Shortness of breath and bilateral lower extremity swelling. EXAM: PORTABLE CHEST 1 VIEW COMPARISON:  June 15, 2020 FINDINGS: A tracheostomy tube is in place. Multiple sternal wires and vascular clips are seen. Moderate to marked severity diffusely increased interstitial lung markings are present. Radiopaque surgical sutures are seen overlying the lateral aspect of the right lung base. There is no evidence of a pleural effusion or pneumothorax. The cardiac silhouette is markedly enlarged and unchanged in size. Degenerative changes seen throughout the thoracic spine. IMPRESSION: Stable cardiomegaly with moderate to marked severity interstitial edema and/or chronic interstitial lung disease. Electronically Signed   By: Virgina Norfolk M.D.   On: 06/25/2020 16:44    Cardiac Studies    Patient Profile     Theresa [name] is a 54 y.o. female with a hx of chronic combined systyolic/diastolic HF, CAD with prior CABG, chronic respiratory failure with chronic trach, who is being seen today for the evaluation of SOB and leg edema at the request of Dr  Manuella Ghazi  Assessment & Plan   1. Acute on chronic combined systolic/diastolic HF - 02/8087 echo LVEF 40-45%, grade III DDx, normal RV function - admitted with 14 lbs weight gain from home report (from [place] weights 5 lbs since discharge last week), LE edema. BNP in the 500s, pulm edema on cxr - she is on IV lasix 60mg  tid, I/Os are incomplete. Weights are pending, renal function is stable. Increase IV lasix to 80mg  tid.     For questions or updates, please contact [place] Please consult [email or web address] for contact info under        Signed, Carlyle Dolly, MD  06/27/2020, 10:21 AM

## 2020-06-27 NOTE — Plan of Care (Signed)

## 2020-06-27 NOTE — Progress Notes (Signed)
MD notified of pt desat 84-88% on 10L 98% FIO2 via trach collar. Pt stated she does not feel well but it is not one particular thing. RT provided interventions at 2349 due to similar concerns. Will continue to monitor and await further orders

## 2020-06-27 NOTE — TOC Progression Note (Signed)
Transition of Care [facility]) - Progression Note   Patient Details  Name: Theresa [name] MRN: [id number] Date of Birth: [birth year]  Transition of Care [facility]) CM/SW [place], LCSW Phone Number: 06/27/2020, 11:39 AM  Clinical Narrative: CSW received call from [facility] with [facility]. Per Lenna Sciara, patient is only active with RN and an aide at this time.  Expected Discharge Plan: [place] Barriers to Discharge: Continued Medical Work up  Expected Discharge Plan and Services Expected Discharge Plan: [place] In-house Referral: Clinical Social Work Discharge Planning Services: NA Post Acute Care Choice: [place] arrangements for the past 2 months: Single Family Home                 DME Arranged: N/A DME Agency: NA                   Social Determinants of Health (SDOH) Interventions    Readmission Risk Interventions Readmission Risk Prevention Plan 05/17/2019  Transportation Screening Complete  PCP or Specialist Appt within 3-5 Days Complete  HRI or [place] Complete  Social Work Consult for [place] Planning/Counseling Complete  Palliative Care Screening Not Applicable  Medication Review Press photographer) Complete  Some recent data might be hidden

## 2020-06-27 NOTE — Progress Notes (Signed)
PROGRESS NOTE    DIKSHA TAGLIAFERRO  VZC:[id number] DOB: [birth year] DOA: 06/25/2020 PCP: Monico Blitz, MD   Brief Narrative:  Per HPI: Tahjanae Blankenburg Mooreis a 54 y.o.femalewith medical history significant forchronic respiratory failure on 4 L, systolic and diastolic CHF, coronary artery disease, DVT, diabetes mellitus, hypertension, psoriasis. Patient was seen today by advanced home care nurse.It was found that patient had gained 14 pounds since she was recently discharged from the hospital, patient was also having increasing difficulty breathing, with bilateral lower extremity swelling and bloating. Patient was referred to the ED. Patient reports compliance with her torsemide40mg  BID,and denies dietary indiscretion. Patient has a trach. She reports productive cough, wheezing since yesterday. No chest pain.  Recent hospitalization 10/9-10/15,for severe sepsis due to E. coli bacteremia,cholecystitis,lactic acid peaked at 2.8, she was treated with ceftriaxone and metronidazole, and transitioned over to Augmentin on discharge. General surgery recommended nonoperative management cholecystitis, as patient is not a good surgical candidate. She was also managed for acute on chronic hypoxic respiratory failure, and decompensated CHF,COPD exacerbation,requiring 10 L of oxygen,she was discharged on her baseline 4 L.  10/21: Patient admitted with acute on chronic combined diastolic and systolic heart failure with acute on chronic hypoxemic respiratory failure.  Urine output not sufficient and seen by cardiology with plans to increase Lasix to 60 IV 3 times daily and continue to follow diuresis.  She remains on 10 L via trach collar this morning and is still quite short of breath.  10/22: Patient noted to have some ongoing shortness of breath with poorly documented diuresis on current dose of Lasix.  This has been increased by cardiology this morning to Lasix 80 mg 3 times daily.  Assessment &  Plan:   Principal Problem:   Acute on chronic combined systolic and diastolic HF (heart failure) (HCC) Active Problems:   DM (diabetes mellitus) ([place])   Essential hypertension   OSA (obstructive sleep apnea)   Lupus anticoagulant syndrome (HCC)   CAD (coronary artery disease) of artery bypass graft: PTCA/DES to distal body VG to PDA 10/29/15   Tracheostomy in place [facility]), chronic since 2002   Acute on chronic respiratory failure with hypoxia ([place])   Uncontrolled type 2 diabetes mellitus with hyperglycemia, with long-term current use of insulin (HCC)   Acute on chronic respiratory failure with hypoxia (HCC)   Chronic respiratory insufficiency   DVT (deep venous thrombosis) (HCC)   Acute on chronic hypoxemic respiratory failure secondary to acute on chronic combined diastolic and systolic heart failure-ongoing -Only on 10 L oxygen via trach collar with 4 L at baseline -She reports compliance with 40 mg torsemide twice daily and did not have sufficient urine output with IV Lasix 60 mg tid, therefore this has been increased to 80 mg 3 times daily 10/22 -Follow daily BMP, daily weights, strict I's and O's -Appreciate cardiology evaluation  COPD -Continue on duo nebs and mucolytic's with Mucomyst ordered -Flutter valve  Lupus anticoagulant syndrome/history of DVT on chronic anticoagulation -Continue warfarin with pharmacy to dose, PT/INR  History of CABG -Continue warfarin  Uncontrolled type 2 diabetes likely related to steroid-induced hyperglycemia, improving -Continue SSI while holding Ozempic -Continue home [place]  Depression -Continue home sertraline and lorazepam   DVT prophylaxis:Warfarin Code Status: Full code Family Communication: None at bedside Disposition Plan:  Status is: Inpatient  Remains inpatient appropriate because:IV treatments appropriate due to intensity of illness or inability to take PO and Inpatient level of care appropriate due to severity of  illness  Dispo: The patient is from: Home  Anticipated d/c is to: Home  Anticipated d/c date is: 2-3 days  Patient currently is not medically stable to d/c.  She requires ongoing diuresis and inpatient management.   Consultants:   Cardiology  Procedures:   See below  Antimicrobials:   None  Subjective: Patient seen and evaluated today with ongoing shortness of breath and chest congestion noted.  Objective: Vitals:   06/26/20 2019 06/26/20 2055 06/27/20 0622 06/27/20 0952  BP:  132/89 136/63   Pulse: 84 86 75   Resp: 20 (!) 22 (!) 21   Temp:  98.6 F (37 C) 98.8 F (37.1 C)   TempSrc:  Oral Oral   SpO2: 95% 94% 96% 92%  Weight:      Height:        Intake/Output Summary (Last 24 hours) at 06/27/2020 1220 Last data filed at 06/26/2020 1806 Gross per 24 hour  Intake 120 ml  Output 1200 ml  Net -1080 ml   Filed Weights   06/25/20 1434 06/26/20 0500  Weight: 96.9 kg 99 kg    Examination:  General exam: Appears calm and comfortable, obese Respiratory system: Clear to auscultation. Respiratory effort normal.  Tracheostomy with 10 L trach collar noted. Cardiovascular system: S1 & S2 heard, RRR.  Ongoing pedal edema Gastrointestinal system: Abdomen is nondistended, soft and nontender.  Central nervous system: Alert and oriented. No focal neurological deficits. Extremities: Diffuse bilateral edema noted Skin: No rashes, lesions or ulcers Psychiatry: Flat affect    Data Reviewed: I have personally reviewed following labs and imaging studies  CBC: Recent Labs  Lab 06/25/20 1633 06/27/20 0346  WBC 10.3 13.1*  NEUTROABS 9.0*  --   HGB 12.5 13.2  HCT 39.2 42.0  MCV 96.3 96.8  PLT 260 761   Basic Metabolic Panel: Recent Labs  Lab 06/20/20 1552 06/25/20 1633 06/26/20 0337 06/27/20 0346  NA 137 134* 139 137  K 4.0 3.5 3.6 3.6  CL 89* 91* 93* 92*  CO2 33* 33* 35* 33*  GLUCOSE 424* 473* 140* 83  BUN 21* 17  18 18   CREATININE 1.03* 0.78 0.63 0.64  CALCIUM 8.8* 7.9* 8.4* 8.8*  MG  --   --   --  1.9   GFR: Estimated Creatinine Clearance: 81.3 mL/min (by C-G formula based on SCr of 0.64 mg/dL). Liver Function Tests: Recent Labs  Lab 06/25/20 1633  AST 17  ALT 29  ALKPHOS 173*  BILITOT 0.9  PROT 6.3*  ALBUMIN 2.9*   No results for input(s): LIPASE, AMYLASE in the last 168 hours. No results for input(s): AMMONIA in the last 168 hours. Coagulation Profile: Recent Labs  Lab 06/25/20 1633 06/26/20 0337 06/27/20 0346  INR 1.8* 1.8* 1.7*   Cardiac Enzymes: No results for input(s): CKTOTAL, CKMB, CKMBINDEX, TROPONINI in the last 168 hours. BNP (last 3 results) No results for input(s): PROBNP in the last 8760 hours. HbA1C: No results for input(s): HGBA1C in the last 72 hours. CBG: Recent Labs  Lab 06/26/20 1147 06/26/20 1635 06/26/20 2005 06/27/20 0743 06/27/20 1148  GLUCAP 131* 290* 192* 84 175*   Lipid Profile: No results for input(s): CHOL, HDL, LDLCALC, TRIG, CHOLHDL, LDLDIRECT in the last 72 hours. Thyroid Function Tests: No results for input(s): TSH, T4TOTAL, FREET4, T3FREE, THYROIDAB in the last 72 hours. Anemia Panel: No results for input(s): VITAMINB12, FOLATE, FERRITIN, TIBC, IRON, RETICCTPCT in the last 72 hours. Sepsis Labs: No results for input(s): PROCALCITON, LATICACIDVEN in the last 168  hours.  Recent Results (from the past 240 hour(s))  Respiratory Panel by RT PCR (Flu A&B, Covid) - Nasopharyngeal Swab     Status: None   Collection Time: 06/25/20  4:07 PM   Specimen: Nasopharyngeal Swab  Result Value Ref Range Status   SARS Coronavirus 2 by RT PCR NEGATIVE NEGATIVE Final    Comment: (NOTE) SARS-CoV-2 target nucleic acids are NOT DETECTED.  The SARS-CoV-2 RNA is generally detectable in upper respiratoy specimens during the acute phase of infection. The lowest concentration of SARS-CoV-2 viral copies this assay can detect is 131 copies/mL. A negative  result does not preclude SARS-Cov-2 infection and should not be used as the sole basis for treatment or other patient management decisions. A negative result may occur with  improper specimen collection/handling, submission of specimen other than nasopharyngeal swab, presence of viral mutation(s) within the areas targeted by this assay, and inadequate number of viral copies (<131 copies/mL). A negative result must be combined with clinical observations, patient history, and epidemiological information. The expected result is Negative.  Fact Sheet for Patients:  PinkCheek.be  Fact Sheet for Healthcare Providers:  GravelBags.it  This test is no t yet approved or cleared by the [place] FDA and  has been authorized for detection and/or diagnosis of SARS-CoV-2 by FDA under an Emergency Use Authorization (EUA). This EUA will remain  in effect (meaning this test can be used) for the duration of the COVID-19 declaration under Section 564(b)(1) of the Act, 21 U.S.C. section 360bbb-3(b)(1), unless the authorization is terminated or revoked sooner.     Influenza A by PCR NEGATIVE NEGATIVE Final   Influenza B by PCR NEGATIVE NEGATIVE Final    Comment: (NOTE) The Xpert Xpress SARS-CoV-2/FLU/RSV assay is intended as an aid in  the diagnosis of influenza from Nasopharyngeal swab specimens and  should not be used as a sole basis for treatment. Nasal washings and  aspirates are unacceptable for Xpert Xpress SARS-CoV-2/FLU/RSV  testing.  Fact Sheet for Patients: PinkCheek.be  Fact Sheet for Healthcare Providers: GravelBags.it  This test is not yet approved or cleared by the [place] FDA and  has been authorized for detection and/or diagnosis of SARS-CoV-2 by  FDA under an Emergency Use Authorization (EUA). This EUA will remain  in effect (meaning this test can be used)  for the duration of the  Covid-19 declaration under Section 564(b)(1) of the Act, 21  U.S.C. section 360bbb-3(b)(1), unless the authorization is  terminated or revoked. Performed at [facility], [address], [place], [place] [address]          Radiology Studies: [facility] 1 View  Result Date: 06/25/2020 CLINICAL DATA:  Shortness of breath and bilateral lower extremity swelling. EXAM: PORTABLE CHEST 1 VIEW COMPARISON:  June 15, 2020 FINDINGS: A tracheostomy tube is in place. Multiple sternal wires and vascular clips are seen. Moderate to marked severity diffusely increased interstitial lung markings are present. Radiopaque surgical sutures are seen overlying the lateral aspect of the right lung base. There is no evidence of a pleural effusion or pneumothorax. The cardiac silhouette is markedly enlarged and unchanged in size. Degenerative changes seen throughout the thoracic spine. IMPRESSION: Stable cardiomegaly with moderate to marked severity interstitial edema and/or chronic interstitial lung disease. Electronically Signed   By: Virgina Norfolk M.D.   On: 06/25/2020 16:44        Scheduled Meds: . acetylcysteine  2 mL Nebulization BID  . furosemide  80 mg Intravenous TID PC  .  gabapentin  900 mg Oral QHS  . insulin aspart  0-15 Units Subcutaneous TID WC  . insulin aspart  0-5 Units Subcutaneous QHS  . insulin glargine  50 Units Subcutaneous QHS  . losartan  25 mg Oral Daily  . metoprolol succinate  25 mg Oral Daily  . pantoprazole  40 mg Oral Daily  . [START ON 06/28/2020] potassium chloride SA  40 mEq Oral Daily  . sertraline  50 mg Oral QHS  . tamsulosin  0.4 mg Oral Daily  . warfarin  5 mg Oral ONCE-1600  . Warfarin - Pharmacist Dosing Inpatient   Does not apply q1600    LOS: 2 days    Time spent: 30 minutes    Amaal Dimartino Darleen Crocker, DO [facility]  If 7PM-7AM, please contact night-coverage [email or web address] 06/27/2020, 12:20 PM

## 2020-06-27 NOTE — Care Management Important Message (Signed)
Important Message  Patient Details  Name: Theresa [name] MRN: [id number] Date of Birth: [birth year]   Medicare Important Message Given:  Yes     [name] 06/27/2020, 2:51 PM

## 2020-06-27 NOTE — Progress Notes (Signed)
ANTICOAGULATION CONSULT NOTE - Initial Consult  Pharmacy Consult for warfarin Indication: lupus anticoagulant and hx VTE  Patient Measurements: Height: 4\' 10"  (147.3 cm) Weight: 99 kg (218 lb 4.1 oz) IBW/kg (Calculated) : 40.9  Vital Signs: Temp: 98.8 F (37.1 C) (10/22 0622) Temp Source: Oral (10/22 0622) BP: 136/63 (10/22 0622) Pulse Rate: 75 (10/22 0622)  Labs: Recent Labs    06/25/20 1633 06/26/20 0337 06/27/20 0346  HGB 12.5  --  13.2  HCT 39.2  --  42.0  PLT 260  --  263  LABPROT 20.2* 20.5* 19.2*  INR 1.8* 1.8* 1.7*  CREATININE 0.78 0.63 0.64    Estimated Creatinine Clearance: 81.3 mL/min (by C-G formula based on SCr of 0.64 mg/dL).  Assessment: 68 yof who presents to the ED with increasing SOB and bilateral leg swelling. She was recently discharged from the hospital on 10/15. She takes warfarin PTA for lupus anticoagulant and hx VTE. Pharmacy consulted to dose warfarin while inpatient.   Home dose:  2.5 mg daily except 5 mg MWF-->last dose 10/20 10/22: On 10/20 Coumadin 2.5mg  dose not given. So on 10/21 7.5mg  given. INR is subtherapeutic at 1.7 today  Goal of Therapy:  INR 2-3 Monitor platelets by anticoagulation protocol: Yes   Plan:  Give warfarin 5 mg po x1 dose today Daily INR and every other day CBC Monitor for signs and symptoms  of bleeding  Thank you for involving pharmacy in this patient's care.  Isac Sarna, BS Pharm D, [place] Clinical Pharmacist Pager [phone] 06/27/2020 10:57 AM

## 2020-06-28 ENCOUNTER — Inpatient Hospital Stay (HOSPITAL_COMMUNITY): Payer: Medicare Other

## 2020-06-28 DIAGNOSIS — I5043 Acute on chronic combined systolic (congestive) and diastolic (congestive) heart failure: Secondary | ICD-10-CM | POA: Diagnosis not present

## 2020-06-28 LAB — MAGNESIUM: Magnesium: 2 mg/dL (ref 1.7–2.4)

## 2020-06-28 LAB — GLUCOSE, CAPILLARY
Glucose-Capillary: 68 mg/dL — ABNORMAL LOW (ref 70–99)
Glucose-Capillary: 264 mg/dL — ABNORMAL HIGH (ref 70–99)
Glucose-Capillary: 302 mg/dL — ABNORMAL HIGH (ref 70–99)
Glucose-Capillary: 174 mg/dL — ABNORMAL HIGH (ref 70–99)

## 2020-06-28 LAB — CBC
HCT: 43 % (ref 36.0–46.0)
Hemoglobin: 13.1 g/dL (ref 12.0–15.0)
MCH: 30.3 pg (ref 26.0–34.0)
MCHC: 30.5 g/dL (ref 30.0–36.0)
MCV: 99.5 fL (ref 80.0–100.0)
Platelets: 201 10*3/uL (ref 150–400)
RBC: 4.32 MIL/uL (ref 3.87–5.11)
RDW: 16.8 % — ABNORMAL HIGH (ref 11.5–15.5)
WBC: 10 10*3/uL (ref 4.0–10.5)
nRBC: 0 % (ref 0.0–0.2)

## 2020-06-28 LAB — BASIC METABOLIC PANEL
Anion gap: 12 (ref 5–15)
BUN: 19 mg/dL (ref 6–20)
CO2: 36 mmol/L — ABNORMAL HIGH (ref 22–32)
Calcium: 8.9 mg/dL (ref 8.9–10.3)
Chloride: 90 mmol/L — ABNORMAL LOW (ref 98–111)
Creatinine, Ser: 0.77 mg/dL (ref 0.44–1.00)
GFR, Estimated: >60 mL/min (ref >60)
Glucose, Bld: 74 mg/dL (ref 70–99)
Potassium: 3.8 mmol/L (ref 3.5–5.1)
Sodium: 138 mmol/L (ref 135–145)

## 2020-06-28 LAB — PROTIME-INR
INR: 1.9 — ABNORMAL HIGH (ref 0.8–1.2)
Prothrombin Time: 20.9 seconds — ABNORMAL HIGH (ref 11.4–15.2)

## 2020-06-28 MED ORDER — WARFARIN SODIUM 2.5 MG PO TABS
2.5000 mg | ORAL_TABLET | Freq: Once | ORAL | Status: AC
  Administered 2020-06-28: 2.5 mg via ORAL
  Filled 2020-06-28: qty 1

## 2020-06-28 MED ORDER — INSULIN GLARGINE 100 UNIT/ML ~~LOC~~ SOLN
25.0000 [IU] | Freq: Every day | SUBCUTANEOUS | Status: DC
  Administered 2020-06-28 – 2020-06-30 (×3): 25 [IU] via SUBCUTANEOUS
  Filled 2020-06-28 (×3): qty 0.25

## 2020-06-28 MED ORDER — GUAIFENESIN-DM 100-10 MG/5ML PO SYRP
5.0000 mL | ORAL_SOLUTION | ORAL | Status: DC | PRN
  Administered 2020-06-28 – 2020-07-03 (×6): 5 mL via ORAL
  Filled 2020-06-28 (×6): qty 5

## 2020-06-28 MED ORDER — METOLAZONE 5 MG PO TABS
2.5000 mg | ORAL_TABLET | Freq: Once | ORAL | Status: AC
  Administered 2020-06-28: 2.5 mg via ORAL
  Filled 2020-06-28: qty 1

## 2020-06-28 NOTE — Progress Notes (Signed)
ANTICOAGULATION CONSULT NOTE -   Pharmacy Consult for warfarin Indication: lupus anticoagulant and hx VTE  Patient Measurements: Height: 4\' 10"  (147.3 cm) Weight: 75.2 kg (165 lb 12.6 oz) IBW/kg (Calculated) : 40.9  Vital Signs: Temp: 98.8 F (37.1 C) (10/23 0521) Temp Source: Oral (10/23 0521) BP: 98/71 (10/23 0521) Pulse Rate: 94 (10/23 0800)  Labs: Recent Labs    06/25/20 1633 06/25/20 1633 06/26/20 0337 06/27/20 0346 06/28/20 0559  HGB 12.5   < >  --  13.2 13.1  HCT 39.2  --   --  42.0 43.0  PLT 260  --   --  263 201  LABPROT 20.2*   < > 20.5* 19.2* 20.9*  INR 1.8*   < > 1.8* 1.7* 1.9*  CREATININE 0.78   < > 0.63 0.64 0.77   < > = values in this interval not displayed.    Estimated Creatinine Clearance: 69.3 mL/min (by C-G formula based on SCr of 0.77 mg/dL).  Assessment: 45 yof who presents to the ED with increasing SOB and bilateral leg swelling. She was recently discharged from the hospital on 10/15. She takes warfarin PTA for lupus anticoagulant and hx VTE. Pharmacy consulted to dose warfarin while inpatient.   Home dose:  2.5 mg daily except 5 mg MWF-->last dose 10/20 10/22: On 10/20 Coumadin 2.5mg  dose not given. So on 10/21 7.5mg  given.  INR is subtherapeutic at 1.9 but trending up, almost therapeutic. Will give home dose since booster dose given 2 days ago  Goal of Therapy:  INR 2-3 Monitor platelets by anticoagulation protocol: Yes   Plan:  Give warfarin 2.5 mg po x1 dose today Daily INR and every other day CBC Monitor for signs and symptoms  of bleeding  Thank you for involving pharmacy in this patient's care.  Isac Sarna, BS Pharm D, [place] Clinical Pharmacist Pager [phone] 06/28/2020 9:41 AM

## 2020-06-28 NOTE — Progress Notes (Signed)
PROGRESS NOTE    Theresa [name]  GYF:[id number] DOB: 1966-03-15 DOA: 06/25/2020 PCP: Monico Blitz, MD   Brief Narrative:  Per HPI: Theresa Shanker Mooreis a 54 y.o.femalewith medical history significant forchronic respiratory failure on 4 L, systolic and diastolic CHF, coronary artery disease, DVT, diabetes mellitus, hypertension, psoriasis. Patient was seen today by advanced home care nurse.It was found that patient had gained 14 pounds since she was recently discharged from the hospital, patient was also having increasing difficulty breathing, with bilateral lower extremity swelling and bloating. Patient was referred to the ED. Patient reports compliance with her torsemide40mg  BID,and denies dietary indiscretion. Patient has a trach. She reports productive cough, wheezing since yesterday. No chest pain.  Recent hospitalization 10/9-10/15,for severe sepsis due to E. coli bacteremia,cholecystitis,lactic acid peaked at 2.8, she was treated with ceftriaxone and metronidazole, and transitioned over to Augmentin on discharge. General surgery recommended nonoperative management cholecystitis, as patient is not a good surgical candidate. She was also managed for acute on chronic hypoxic respiratory failure, and decompensated CHF,COPD exacerbation,requiring 10 L of oxygen,she was discharged on her baseline 4 L.  10/21:Patient admitted with acute on chronic combined diastolic and systolic heart failure with acute on chronic hypoxemic respiratory failure. Urine output not sufficient and seen by cardiology with plans to increase Lasix to 60 IV 3 times daily and continue to follow diuresis. She remains on 10 L via trach collar this morning and is still quite short of breath.  10/22: Patient noted to have some ongoing shortness of breath with poorly documented diuresis on current dose of Lasix.  This has been increased by cardiology this morning to Lasix 80 mg 3 times daily.  10/23:  Patient continues to remain short of breath on her trach collar with 10 L oxygen supplementation.  She still states that she is not having any adequate urine output and is still quite short of breath.  Recorded weights appear inaccurate.  She appears to have had a net -800 mL fluid output.  Plan to continue Lasix and trial metolazone today.  May need nephrology consultation by a.m. if not improving.  Assessment & Plan:   Principal Problem:   Acute on chronic combined systolic and diastolic HF (heart failure) (HCC) Active Problems:   DM (diabetes mellitus) ([place])   Essential hypertension   OSA (obstructive sleep apnea)   Lupus anticoagulant syndrome (HCC)   CAD (coronary artery disease) of artery bypass graft: PTCA/DES to distal body VG to PDA 10/29/15   Tracheostomy in place [facility]), chronic since 2002   Acute on chronic respiratory failure with hypoxia ([place])   Uncontrolled type 2 diabetes mellitus with hyperglycemia, with long-term current use of insulin (HCC)   Acute on chronic respiratory failure with hypoxia (HCC)   Chronic respiratory insufficiency   DVT (deep venous thrombosis) (HCC)   Acute on chronic hypoxemic respiratory failure secondary to acute on chronic combined diastolic and systolic heart failure-ongoing -Only on 10 L oxygen via trach collar with 4 L at baseline -She reports compliance with 40 mg torsemide twice daily and did not have sufficient urine output with IV Lasix 60 mg tid, therefore this has been increased to 80 mg 3 times daily 10/22 -Follow daily BMP, daily weights, strict I's and O's -Trial of metolazone 2.5 mg one-time dose on 10/23 -Appreciate cardiology evaluation -Hold home losartan and metoprolol with holding parameters due to softening blood pressure readings.  COPD -Continue on duo nebs and mucolytic's with Mucomyst ordered -Flutter valve  Lupus anticoagulant syndrome/history  of DVT on chronic anticoagulation -Continue warfarin with pharmacy to dose,  PT/INR  History of CABG -Continue warfarin  Uncontrolled type 2 diabetes likely related to steroid-induced hyperglycemia, improving -Continue SSI while holding Ozempic -Continue home Tresiba at half dose  Depression -Continue home sertraline and lorazepam   DVT prophylaxis:Warfarin Code Status:Full code Family Communication:Discussed with husband on phone 10/22 Disposition Plan: Status is: Inpatient  Remains inpatient appropriate because:IV treatments appropriate due to intensity of illness or inability to take PO and Inpatient level of care appropriate due to severity of illness   Dispo: The patient is from:Home Anticipated d/c is MH:DQQI Anticipated d/c date is: 2-3 days Patient currently is not medically stable to d/c.  She requires ongoing diuresis and inpatient management.   Consultants:  Cardiology  Procedures:  See below  Antimicrobials:   None   Subjective: Patient seen and evaluated today with no new acute complaints or concerns. No acute concerns or events noted overnight.  She continues to have some ongoing shortness of breath and states that her urine output has not been picking up.  Objective: Vitals:   06/27/20 2114 06/28/20 0521 06/28/20 0600 06/28/20 0800  BP: 98/69 98/71    Pulse: 97 96  94  Resp: 20 20  16   Temp: 99.3 F (37.4 C) 98.8 F (37.1 C)    TempSrc: Oral Oral    SpO2: 91% 95%  96%  Weight:   75.2 kg   Height:   4\' 10"  (1.473 m)    No intake or output data in the 24 hours ending 06/28/20 1127 Filed Weights   06/25/20 1434 06/26/20 0500 06/28/20 0600  Weight: 96.9 kg 99 kg 75.2 kg    Examination:  General exam: Appears calm and comfortable, obese and volume overloaded Respiratory system: Clear to auscultation. Respiratory effort normal.  Trach collar with 10 L oxygen supplementation Cardiovascular system: S1 & S2 heard, RRR.  Gastrointestinal system: Abdomen is  nondistended, soft and nontender.  Central nervous system: Alert and awake Extremities: Diffuse edema bilaterally Skin: No rashes, lesions or ulcers Psychiatry: Judgement and insight appear normal. Mood & affect appropriate.     Data Reviewed: I have personally reviewed following labs and imaging studies  CBC: Recent Labs  Lab 06/25/20 1633 06/27/20 0346 06/28/20 0559  WBC 10.3 13.1* 10.0  NEUTROABS 9.0*  --   --   HGB 12.5 13.2 13.1  HCT 39.2 42.0 43.0  MCV 96.3 96.8 99.5  PLT 260 263 297   Basic Metabolic Panel: Recent Labs  Lab 06/25/20 1633 06/26/20 0337 06/27/20 0346 06/28/20 0559  NA 134* 139 137 138  K 3.5 3.6 3.6 3.8  CL 91* 93* 92* 90*  CO2 33* 35* 33* 36*  GLUCOSE 473* 140* 83 74  BUN 17 18 18 19   CREATININE 0.78 0.63 0.64 0.77  CALCIUM 7.9* 8.4* 8.8* 8.9  MG  --   --  1.9 2.0   GFR: Estimated Creatinine Clearance: 69.3 mL/min (by C-G formula based on SCr of 0.77 mg/dL). Liver Function Tests: Recent Labs  Lab 06/25/20 1633  AST 17  ALT 29  ALKPHOS 173*  BILITOT 0.9  PROT 6.3*  ALBUMIN 2.9*   No results for input(s): LIPASE, AMYLASE in the last 168 hours. No results for input(s): AMMONIA in the last 168 hours. Coagulation Profile: Recent Labs  Lab 06/25/20 1633 06/26/20 0337 06/27/20 0346 06/28/20 0559  INR 1.8* 1.8* 1.7* 1.9*   Cardiac Enzymes: No results for input(s): CKTOTAL, CKMB, CKMBINDEX, TROPONINI in  the last 168 hours. BNP (last 3 results) No results for input(s): PROBNP in the last 8760 hours. HbA1C: No results for input(s): HGBA1C in the last 72 hours. CBG: Recent Labs  Lab 06/27/20 0743 06/27/20 1148 06/27/20 1753 06/27/20 2116 06/28/20 0736  GLUCAP 84 175* 163* 122* 68*   Lipid Profile: No results for input(s): CHOL, HDL, LDLCALC, TRIG, CHOLHDL, LDLDIRECT in the last 72 hours. Thyroid Function Tests: No results for input(s): TSH, T4TOTAL, FREET4, T3FREE, THYROIDAB in the last 72 hours. Anemia Panel: No results  for input(s): VITAMINB12, FOLATE, FERRITIN, TIBC, IRON, RETICCTPCT in the last 72 hours. Sepsis Labs: Recent Labs  Lab 06/27/20 1424  PROCALCITON <0.10    Recent Results (from the past 240 hour(s))  Respiratory Panel by RT PCR (Flu A&B, Covid) - Nasopharyngeal Swab     Status: None   Collection Time: 06/25/20  4:07 PM   Specimen: Nasopharyngeal Swab  Result Value Ref Range Status   SARS Coronavirus 2 by RT PCR NEGATIVE NEGATIVE Final    Comment: (NOTE) SARS-CoV-2 target nucleic acids are NOT DETECTED.  The SARS-CoV-2 RNA is generally detectable in upper respiratoy specimens during the acute phase of infection. The lowest concentration of SARS-CoV-2 viral copies this assay can detect is 131 copies/mL. A negative result does not preclude SARS-Cov-2 infection and should not be used as the sole basis for treatment or other patient management decisions. A negative result may occur with  improper specimen collection/handling, submission of specimen other than nasopharyngeal swab, presence of viral mutation(s) within the areas targeted by this assay, and inadequate number of viral copies (<131 copies/mL). A negative result must be combined with clinical observations, patient history, and epidemiological information. The expected result is Negative.  Fact Sheet for Patients:  PinkCheek.be  Fact Sheet for Healthcare Providers:  GravelBags.it  This test is no t yet approved or cleared by the [place] FDA and  has been authorized for detection and/or diagnosis of SARS-CoV-2 by FDA under an Emergency Use Authorization (EUA). This EUA will remain  in effect (meaning this test can be used) for the duration of the COVID-19 declaration under Section 564(b)(1) of the Act, 21 U.S.C. section 360bbb-3(b)(1), unless the authorization is terminated or revoked sooner.     Influenza A by PCR NEGATIVE NEGATIVE Final   Influenza B by  PCR NEGATIVE NEGATIVE Final    Comment: (NOTE) The Xpert Xpress SARS-CoV-2/FLU/RSV assay is intended as an aid in  the diagnosis of influenza from Nasopharyngeal swab specimens and  should not be used as a sole basis for treatment. Nasal washings and  aspirates are unacceptable for Xpert Xpress SARS-CoV-2/FLU/RSV  testing.  Fact Sheet for Patients: PinkCheek.be  Fact Sheet for Healthcare Providers: GravelBags.it  This test is not yet approved or cleared by the [place] FDA and  has been authorized for detection and/or diagnosis of SARS-CoV-2 by  FDA under an Emergency Use Authorization (EUA). This EUA will remain  in effect (meaning this test can be used) for the duration of the  Covid-19 declaration under Section 564(b)(1) of the Act, 21  U.S.C. section 360bbb-3(b)(1), unless the authorization is  terminated or revoked. Performed at [facility], [address] [place], [place] [address]          Radiology Studies: No results found.      Scheduled Meds: . furosemide  80 mg Intravenous TID PC  . gabapentin  900 mg Oral QHS  . insulin aspart  0-15 Units Subcutaneous TID WC  .  insulin aspart  0-5 Units Subcutaneous QHS  . insulin glargine  50 Units Subcutaneous QHS  . metolazone  2.5 mg Oral Once  . metoprolol succinate  25 mg Oral Daily  . pantoprazole  40 mg Oral Daily  . potassium chloride SA  40 mEq Oral Daily  . sertraline  50 mg Oral QHS  . tamsulosin  0.4 mg Oral Daily  . warfarin  2.5 mg Oral ONCE-1600  . Warfarin - Pharmacist Dosing Inpatient   Does not apply q1600    LOS: 3 days    Time spent: 30 minutes    Gerron Guidotti Darleen Crocker, DO [facility]  If 7PM-7AM, please contact night-coverage [email or web address] 06/28/2020, 11:27 AM

## 2020-06-29 ENCOUNTER — Inpatient Hospital Stay (HOSPITAL_COMMUNITY): Payer: Medicare Other

## 2020-06-29 DIAGNOSIS — I5043 Acute on chronic combined systolic (congestive) and diastolic (congestive) heart failure: Secondary | ICD-10-CM | POA: Diagnosis not present

## 2020-06-29 LAB — CBC
HCT: 43.1 % (ref 36.0–46.0)
Hemoglobin: 13.4 g/dL (ref 12.0–15.0)
MCH: 30.5 pg (ref 26.0–34.0)
MCHC: 31.1 g/dL (ref 30.0–36.0)
MCV: 98.2 fL (ref 80.0–100.0)
Platelets: 192 10*3/uL (ref 150–400)
RBC: 4.39 MIL/uL (ref 3.87–5.11)
RDW: 16.4 % — ABNORMAL HIGH (ref 11.5–15.5)
WBC: 7.5 10*3/uL (ref 4.0–10.5)
nRBC: 0 % (ref 0.0–0.2)

## 2020-06-29 LAB — BASIC METABOLIC PANEL
Anion gap: 12 (ref 5–15)
BUN: 23 mg/dL — ABNORMAL HIGH (ref 6–20)
CO2: 34 mmol/L — ABNORMAL HIGH (ref 22–32)
Calcium: 8.9 mg/dL (ref 8.9–10.3)
Chloride: 87 mmol/L — ABNORMAL LOW (ref 98–111)
Creatinine, Ser: 0.97 mg/dL (ref 0.44–1.00)
GFR, Estimated: >60 mL/min (ref >60)
Glucose, Bld: 218 mg/dL — ABNORMAL HIGH (ref 70–99)
Potassium: 3.7 mmol/L (ref 3.5–5.1)
Sodium: 133 mmol/L — ABNORMAL LOW (ref 135–145)

## 2020-06-29 LAB — GLUCOSE, CAPILLARY
Glucose-Capillary: 142 mg/dL — ABNORMAL HIGH (ref 70–99)
Glucose-Capillary: 219 mg/dL — ABNORMAL HIGH (ref 70–99)
Glucose-Capillary: 240 mg/dL — ABNORMAL HIGH (ref 70–99)

## 2020-06-29 LAB — TSH: TSH: 4.443 u[IU]/mL (ref 0.350–4.500)

## 2020-06-29 LAB — TROPONIN I (HIGH SENSITIVITY)
Troponin I (High Sensitivity): 24 ng/L — ABNORMAL HIGH (ref <18)
Troponin I (High Sensitivity): 31 ng/L — ABNORMAL HIGH (ref <18)

## 2020-06-29 LAB — PROTIME-INR
INR: 2.3 — ABNORMAL HIGH (ref 0.8–1.2)
Prothrombin Time: 24.5 seconds — ABNORMAL HIGH (ref 11.4–15.2)

## 2020-06-29 LAB — MAGNESIUM: Magnesium: 1.9 mg/dL (ref 1.7–2.4)

## 2020-06-29 LAB — MRSA PCR SCREENING: MRSA by PCR: NEGATIVE

## 2020-06-29 MED ORDER — METOPROLOL TARTRATE 5 MG/5ML IV SOLN
5.0000 mg | Freq: Four times a day (QID) | INTRAVENOUS | Status: DC
  Administered 2020-06-29 – 2020-07-02 (×8): 5 mg via INTRAVENOUS
  Filled 2020-06-29 (×9): qty 5

## 2020-06-29 MED ORDER — AMIODARONE HCL IN DEXTROSE 360-4.14 MG/200ML-% IV SOLN
INTRAVENOUS | Status: AC
  Administered 2020-06-29: 60 mg/h via INTRAVENOUS
  Filled 2020-06-29: qty 200

## 2020-06-29 MED ORDER — METOLAZONE 5 MG PO TABS
5.0000 mg | ORAL_TABLET | Freq: Every day | ORAL | Status: DC
  Filled 2020-06-29 (×2): qty 1

## 2020-06-29 MED ORDER — AMIODARONE HCL IN DEXTROSE 360-4.14 MG/200ML-% IV SOLN
30.0000 mg/h | INTRAVENOUS | Status: DC
  Administered 2020-06-29 – 2020-07-03 (×9): 30 mg/h via INTRAVENOUS
  Filled 2020-06-29 (×10): qty 200

## 2020-06-29 MED ORDER — DILTIAZEM HCL-DEXTROSE 125-5 MG/125ML-% IV SOLN (PREMIX)
5.0000 mg/h | INTRAVENOUS | Status: DC
  Filled 2020-06-29 (×2): qty 125

## 2020-06-29 MED ORDER — CHLORHEXIDINE GLUCONATE CLOTH 2 % EX PADS
6.0000 | MEDICATED_PAD | Freq: Every day | CUTANEOUS | Status: DC
  Administered 2020-06-29 – 2020-07-07 (×8): 6 via TOPICAL

## 2020-06-29 MED ORDER — AMIODARONE HCL IN DEXTROSE 360-4.14 MG/200ML-% IV SOLN
60.0000 mg/h | INTRAVENOUS | Status: DC
  Administered 2020-06-29: 60 mg/h via INTRAVENOUS
  Filled 2020-06-29: qty 200

## 2020-06-29 MED ORDER — BUMETANIDE 0.25 MG/ML IJ SOLN
2.0000 mg | Freq: Two times a day (BID) | INTRAMUSCULAR | Status: DC
  Administered 2020-06-30 – 2020-07-04 (×8): 2 mg via INTRAVENOUS
  Filled 2020-06-29 (×18): qty 8

## 2020-06-29 MED ORDER — METOPROLOL TARTRATE 5 MG/5ML IV SOLN
5.0000 mg | Freq: Four times a day (QID) | INTRAVENOUS | Status: DC | PRN
  Administered 2020-06-29: 5 mg via INTRAVENOUS
  Filled 2020-06-29: qty 5

## 2020-06-29 MED ORDER — AMIODARONE LOAD VIA INFUSION
150.0000 mg | Freq: Once | INTRAVENOUS | Status: AC
  Administered 2020-06-29: 150 mg via INTRAVENOUS
  Filled 2020-06-29: qty 83.34

## 2020-06-29 MED ORDER — WARFARIN SODIUM 2.5 MG PO TABS
2.5000 mg | ORAL_TABLET | Freq: Once | ORAL | Status: AC
  Administered 2020-06-29: 2.5 mg via ORAL
  Filled 2020-06-29: qty 1

## 2020-06-29 NOTE — Progress Notes (Signed)
Pt was A&O x 4 in no acute distress 10 minutes prior to developing a cardiac arrhythmia with elevated HR.  VS 120/41, 95% on 10L, blood sugar 142, resp 18. Pt transferred to ICU stepdown.

## 2020-06-29 NOTE — Progress Notes (Signed)
ANTICOAGULATION CONSULT NOTE -   Pharmacy Consult for warfarin Indication: lupus anticoagulant and hx VTE  Patient Measurements: Height: 4\' 10"  (147.3 cm) Weight: 75.2 kg (165 lb 12.6 oz) IBW/kg (Calculated) : 40.9  Vital Signs: Temp: 97.7 F (36.5 C) (10/24 0440) Temp Source: Oral (10/23 2244) BP: 97/75 (10/24 0440) Pulse Rate: 118 (10/24 0440)  Labs: Recent Labs    06/27/20 0346 06/27/20 0346 06/28/20 0559 06/29/20 0529  HGB 13.2   < > 13.1 13.4  HCT 42.0  --  43.0 43.1  PLT 263  --  201 192  LABPROT 19.2*  --  20.9* 24.5*  INR 1.7*  --  1.9* 2.3*  CREATININE 0.64  --  0.77 0.97   < > = values in this interval not displayed.    Estimated Creatinine Clearance: 57.1 mL/min (by C-G formula based on SCr of 0.97 mg/dL).  Assessment: 71 yof who presents to the ED with increasing SOB and bilateral leg swelling. She was recently discharged from the hospital on 10/15. She takes warfarin PTA for lupus anticoagulant and hx VTE. Pharmacy consulted to dose warfarin while inpatient.   Home dose:  2.5 mg daily except 5 mg MWF-->last dose 10/20 10/22: On 10/20 Coumadin 2.5mg  dose not given. So on 10/21 7.5mg  given.  INR is therapeutic at 2.3  Goal of Therapy:  INR 2-3 Monitor platelets by anticoagulation protocol: Yes   Plan:  Give warfarin 2.5 mg po x1 dose today Daily INR and every other day CBC Monitor for signs and symptoms  of bleeding  Thank you for involving pharmacy in this patient's care.  Isac Sarna, BS Pharm D, BCPS Clinical Pharmacist Pager [phone] 06/29/2020 8:16 AM

## 2020-06-29 NOTE — Progress Notes (Signed)
PROGRESS NOTE    NAIDELYN PARRELLA  NID:[id number] DOB: Oct 09, 1965 DOA: 06/25/2020 PCP: Monico Blitz, MD   Brief Narrative:  Per HPI: Gerre Ranum Mooreis a 54 y.o.femalewith medical history significant forchronic respiratory failure on 4 L, systolic and diastolic CHF, coronary artery disease, DVT, diabetes mellitus, hypertension, psoriasis. Patient was seen today by advanced home care nurse.It was found that patient had gained 14 pounds since she was recently discharged from the hospital, patient was also having increasing difficulty breathing, with bilateral lower extremity swelling and bloating. Patient was referred to the ED. Patient reports compliance with her torsemide40mg  BID,and denies dietary indiscretion. Patient has a trach. She reports productive cough, wheezing since yesterday. No chest pain.  Recent hospitalization 10/9-10/15,for severe sepsis due to E. coli bacteremia,cholecystitis,lactic acid peaked at 2.8, she was treated with ceftriaxone and metronidazole, and transitioned over to Augmentin on discharge. General surgery recommended nonoperative management cholecystitis, as patient is not a good surgical candidate. She was also managed for acute on chronic hypoxic respiratory failure, and decompensated CHF,COPD exacerbation,requiring 10 L of oxygen,she was discharged on her baseline 4 L.  10/21:Patient admitted with acute on chronic combined diastolic and systolic heart failure with acute on chronic hypoxemic respiratory failure. Urine output not sufficient and seen by cardiology with plans to increase Lasix to 60 IV 3 times daily and continue to follow diuresis. She remains on 10 L via trach collar this morning and is still quite short of breath.  10/22:Patient noted to have some ongoing shortness of breath with poorly documented diuresis on current dose of Lasix. This has been increased by cardiology this morning to Lasix 80 mg 3 times daily.  10/23:  Patient continues to remain short of breath on her trach collar with 10 L oxygen supplementation.  She still states that she is not having any adequate urine output and is still quite short of breath.  Recorded weights appear inaccurate.  She appears to have had a net -800 mL fluid output.  Plan to continue Lasix and trial metolazone today.  May need nephrology consultation by a.m. if not improving.  10/24: Patient noted to develop atrial fibrillation with RVR this morning of new onset.  TSH noted to be 4.4.  She has been transferred to stepdown unit for monitoring and initiated on amiodarone drip given soft blood pressure readings.  No chest pain or shortness of breath or other symptomatology otherwise noted.  She continues to diurese poorly with nephrology recommending Bumex 2 mg IV twice daily along with use of metolazone 5 mg daily once stable.  Troponins with flat trend.  Assessment & Plan:   Principal Problem:   Acute on chronic combined systolic and diastolic HF (heart failure) (HCC) Active Problems:   DM (diabetes mellitus) ([place])   Essential hypertension   OSA (obstructive sleep apnea)   Lupus anticoagulant syndrome (HCC)   CAD (coronary artery disease) of artery bypass graft: PTCA/DES to distal body VG to PDA 10/29/15   Tracheostomy in place [facility]), chronic since 2002   Acute on chronic respiratory failure with hypoxia ([place])   Uncontrolled type 2 diabetes mellitus with hyperglycemia, with long-term current use of insulin (HCC)   Acute on chronic respiratory failure with hypoxia (HCC)   Chronic respiratory insufficiency   DVT (deep venous thrombosis) (HCC)   Acute on chronic hypoxemic respiratory failure secondary to acute on chronic combined diastolic and systolic heart failure-ongoing -Only on 10 L oxygen via trach collar with 4 L at baseline -She reports compliance with  40 mg torsemide twice daily and did not have sufficient urine output with IV Lasix 60 mgtid, therefore this has  been increased to80 mg 3 times daily 10/22 -Currently diuretics are being held on account of arrhythmia.  Nephrology recommendations to initiate Bumex and metolazone as already ordered. -Follow daily BMP, daily weights, strict I's and O's -Appreciate cardiology evaluation -Hold home losartan and metoprolol with holding parameters due to softening blood pressure readings.  New onset atrial fibrillation with RVR -With soft blood pressure readings, therefore started on amiodarone bolus and drip -IV metoprolol ordered as needed to help maintain heart rate control -TSH 4.4 -Limited 2D echocardiogram to be ordered by a.m. once better heart rate control noted -Appreciate cardiology evaluation in a.m. -Patient already anticoagulated with warfarin with INR 2.3  Elevated troponin secondary to above in setting of CAD -Likely some mild demand ischemia with flat trend noted and no chest pain  COPD -No acute bronchospasms currently noted -Continue on duo nebs and mucolytic'swith Mucomyst ordered -Flutter valve  Lupus anticoagulant syndrome/history of DVT on chronic anticoagulation -Continue warfarin with pharmacy to dose, PT/INR  History of CABG -No chest pain currently noted -Continue warfarin  Uncontrolled type 2 diabetes likely related to steroid-induced hyperglycemia, improving -Continue SSI while holding Ozempic -Continue home Tresiba at slightly increased dose  Depression -Continue home sertraline and lorazepam   DVT prophylaxis:Warfarin Code Status:Full code Family Communication:Discussed with husband on phone 10/24, along with sister at bedside Disposition Plan: Status is: Inpatient  Remains inpatient appropriate because:IV treatments appropriate due to intensity of illness or inability to take PO and Inpatient level of care appropriate due to severity of illness   Dispo: The patient is from:Home Anticipated d/c is  [place]:YWVP Anticipated d/c date is: 2-3days Patient currently is not medically stable to d/c.She requires ongoing diuresis and inpatient management for heart rate control.   Consultants:  Cardiology  Procedures:  See below  Antimicrobials:   None  Subjective: Patient seen and evaluated today with no new acute complaints or concerns.  She has developed new onset atrial fibrillation with RVR this morning.  She denies any chest pain or shortness of breath or palpitations.  Objective: Vitals:   06/29/20 0858 06/29/20 0925 06/29/20 1121 06/29/20 1239  BP:  94/70    Pulse: (!) 46 70 (!) 25   Resp: 18 19 18    Temp: (!) 97.1 F (36.2 C)  (!) 97.5 F (36.4 C)   TempSrc: Axillary  Oral   SpO2: 90% 95% 97% 93%  Weight: 97.2 kg     Height: 4\' 10"  (1.473 m)       Intake/Output Summary (Last 24 hours) at 06/29/2020 1329 Last data filed at 06/29/2020 0919 Gross per 24 hour  Intake 240 ml  Output 1200 ml  Net -960 ml   Filed Weights   06/28/20 0600 06/29/20 0500 06/29/20 0858  Weight: 75.2 kg 75.2 kg 97.2 kg    Examination:  General exam: Appears calm and comfortable, obese Respiratory system: Bilateral crackles present.  Tracheostomy with trach collar 10 L supplementation. Cardiovascular system: S1 & S2 heard, irregular and tachycardic. Gastrointestinal system: Abdomen is nondistended, soft and nontender.  Central nervous system: Alert and awake. Extremities: Diffuse bilateral edema. Skin: No rashes, lesions or ulcers Psychiatry: Judgement and insight appear normal. Mood & affect appropriate.     Data Reviewed: I have personally reviewed following labs and imaging studies  CBC: Recent Labs  Lab 06/25/20 1633 06/27/20 0346 06/28/20 0559 06/29/20 0529  WBC 10.3  13.1* 10.0 7.5  NEUTROABS 9.0*  --   --   --   HGB 12.5 13.2 13.1 13.4  HCT 39.2 42.0 43.0 43.1  MCV 96.3 96.8 99.5 98.2  PLT 260 263 201 409   Basic Metabolic  Panel: Recent Labs  Lab 06/25/20 1633 06/26/20 0337 06/27/20 0346 06/28/20 0559 06/29/20 0529  NA 134* 139 137 138 133*  K 3.5 3.6 3.6 3.8 3.7  CL 91* 93* 92* 90* 87*  CO2 33* 35* 33* 36* 34*  GLUCOSE 473* 140* 83 74 218*  BUN 17 18 18 19  23*  CREATININE 0.78 0.63 0.64 0.77 0.97  CALCIUM 7.9* 8.4* 8.8* 8.9 8.9  MG  --   --  1.9 2.0 1.9   GFR: Estimated Creatinine Clearance: 66.4 mL/min (by C-G formula based on SCr of 0.97 mg/dL). Liver Function Tests: Recent Labs  Lab 06/25/20 1633  AST 17  ALT 29  ALKPHOS 173*  BILITOT 0.9  PROT 6.3*  ALBUMIN 2.9*   No results for input(s): LIPASE, AMYLASE in the last 168 hours. No results for input(s): AMMONIA in the last 168 hours. Coagulation Profile: Recent Labs  Lab 06/25/20 1633 06/26/20 0337 06/27/20 0346 06/28/20 0559 06/29/20 0529  INR 1.8* 1.8* 1.7* 1.9* 2.3*   Cardiac Enzymes: No results for input(s): CKTOTAL, CKMB, CKMBINDEX, TROPONINI in the last 168 hours. BNP (last 3 results) No results for input(s): PROBNP in the last 8760 hours. HbA1C: No results for input(s): HGBA1C in the last 72 hours. CBG: Recent Labs  Lab 06/28/20 0736 06/28/20 1150 06/28/20 1618 06/28/20 2237 06/29/20 0808  GLUCAP 68* 264* 302* 174* 142*   Lipid Profile: No results for input(s): CHOL, HDL, LDLCALC, TRIG, CHOLHDL, LDLDIRECT in the last 72 hours. Thyroid Function Tests: Recent Labs    06/29/20 0910  TSH 4.443   Anemia Panel: No results for input(s): VITAMINB12, FOLATE, FERRITIN, TIBC, IRON, RETICCTPCT in the last 72 hours. Sepsis Labs: Recent Labs  Lab 06/27/20 1424  PROCALCITON <0.10    Recent Results (from the past 240 hour(s))  Respiratory Panel by RT PCR (Flu A&B, Covid) - Nasopharyngeal Swab     Status: None   Collection Time: 06/25/20  4:07 PM   Specimen: Nasopharyngeal Swab  Result Value Ref Range Status   SARS Coronavirus 2 by RT PCR NEGATIVE NEGATIVE Final    Comment: (NOTE) SARS-CoV-2 target nucleic  acids are NOT DETECTED.  The SARS-CoV-2 RNA is generally detectable in upper respiratoy specimens during the acute phase of infection. The lowest concentration of SARS-CoV-2 viral copies this assay can detect is 131 copies/mL. A negative result does not preclude SARS-Cov-2 infection and should not be used as the sole basis for treatment or other patient management decisions. A negative result may occur with  improper specimen collection/handling, submission of specimen other than nasopharyngeal swab, presence of viral mutation(s) within the areas targeted by this assay, and inadequate number of viral copies (<131 copies/mL). A negative result must be combined with clinical observations, patient history, and epidemiological information. The expected result is Negative.  Fact Sheet for Patients:  PinkCheek.be  Fact Sheet for Healthcare Providers:  GravelBags.it  This test is no t yet approved or cleared by the [place] FDA and  has been authorized for detection and/or diagnosis of SARS-CoV-2 by FDA under an Emergency Use Authorization (EUA). This EUA will remain  in effect (meaning this test can be used) for the duration of the COVID-19 declaration under Section 564(b)(1) of the Act, 21 U.S.C.  section 360bbb-3(b)(1), unless the authorization is terminated or revoked sooner.     Influenza A by PCR NEGATIVE NEGATIVE Final   Influenza B by PCR NEGATIVE NEGATIVE Final    Comment: (NOTE) The Xpert Xpress SARS-CoV-2/FLU/RSV assay is intended as an aid in  the diagnosis of influenza from Nasopharyngeal swab specimens and  should not be used as a sole basis for treatment. Nasal washings and  aspirates are unacceptable for Xpert Xpress SARS-CoV-2/FLU/RSV  testing.  Fact Sheet for Patients: PinkCheek.be  Fact Sheet for Healthcare Providers: GravelBags.it  This test  is not yet approved or cleared by the [place] FDA and  has been authorized for detection and/or diagnosis of SARS-CoV-2 by  FDA under an Emergency Use Authorization (EUA). This EUA will remain  in effect (meaning this test can be used) for the duration of the  Covid-19 declaration under Section 564(b)(1) of the Act, 21  U.S.C. section 360bbb-3(b)(1), unless the authorization is  terminated or revoked. Performed at [facility], [address], [place], [place] [address]          Radiology Studies: DG Chest 1 View  Result Date: 06/28/2020 CLINICAL DATA:  Hypoxemia EXAM: CHEST  1 VIEW COMPARISON:  06/25/2020 FINDINGS: Tracheostomy tube is noted in satisfactory position. Cardiac shadow is mildly enlarged but accentuated by the portable technique. Vascular congestion with interstitial opacities are again identified but slightly increased from the prior study. Postsurgical changes in the right base are again seen. No bony abnormality is noted. IMPRESSION: Vascular congestion increased interstitial markings consistent with edema. Electronically Signed   By: Inez Catalina M.D.   On: 06/28/2020 16:19   DG CHEST PORT 1 VIEW  Result Date: 06/29/2020 CLINICAL DATA:  Sustained supraventricular tachycardia EXAM: PORTABLE CHEST 1 VIEW COMPARISON:  Yesterday FINDINGS: Tracheostomy tube in stable position. Bilateral pulmonary infiltrate. Postoperative right lung base. Chronic cardiomegaly. There has been CABG and coronary stenting. No visible effusion or pneumothorax. IMPRESSION: Stable cardiomegaly and pulmonary infiltrates. Electronically Signed   By: Monte Fantasia M.D.   On: 06/29/2020 10:56        Scheduled Meds: . bumetanide (BUMEX) IV  2 mg Intravenous Q12H  . Chlorhexidine Gluconate Cloth  6 each Topical Daily  . gabapentin  900 mg Oral QHS  . insulin aspart  0-15 Units Subcutaneous TID WC  . insulin aspart  0-5 Units Subcutaneous QHS  . insulin glargine  25 Units Subcutaneous QHS   . metolazone  5 mg Oral Daily  . metoprolol succinate  25 mg Oral Daily  . pantoprazole  40 mg Oral Daily  . potassium chloride SA  40 mEq Oral Daily  . sertraline  50 mg Oral QHS  . tamsulosin  0.4 mg Oral Daily  . warfarin  2.5 mg Oral ONCE-1600  . Warfarin - Pharmacist Dosing Inpatient   Does not apply q1600   Continuous Infusions: . amiodarone 60 mg/hr (06/29/20 1305)   Followed by  . amiodarone       LOS: 4 days    Time spent: 60 minutes of critical care time.    Liah Morr Darleen Crocker, DO [facility]  If 7PM-7AM, please contact night-coverage [email or web address] 06/29/2020, 1:29 PM

## 2020-06-29 NOTE — Progress Notes (Signed)
XKennon Holter notified  b/p 75/57 MAP 61 HR 119, Spo2 95 on oxygen, r 17. amiodarone at @30 /hr

## 2020-06-30 DIAGNOSIS — I5043 Acute on chronic combined systolic (congestive) and diastolic (congestive) heart failure: Secondary | ICD-10-CM | POA: Diagnosis not present

## 2020-06-30 DIAGNOSIS — I4892 Unspecified atrial flutter: Secondary | ICD-10-CM

## 2020-06-30 LAB — BASIC METABOLIC PANEL
Anion gap: 10 (ref 5–15)
BUN: 33 mg/dL — ABNORMAL HIGH (ref 6–20)
CO2: 35 mmol/L — ABNORMAL HIGH (ref 22–32)
Calcium: 8.8 mg/dL — ABNORMAL LOW (ref 8.9–10.3)
Chloride: 90 mmol/L — ABNORMAL LOW (ref 98–111)
Creatinine, Ser: 0.95 mg/dL (ref 0.44–1.00)
GFR, Estimated: >60 mL/min (ref >60)
Glucose, Bld: 123 mg/dL — ABNORMAL HIGH (ref 70–99)
Potassium: 4.1 mmol/L (ref 3.5–5.1)
Sodium: 135 mmol/L (ref 135–145)

## 2020-06-30 LAB — PROTIME-INR
INR: 2.4 — ABNORMAL HIGH (ref 0.8–1.2)
Prothrombin Time: 25 seconds — ABNORMAL HIGH (ref 11.4–15.2)

## 2020-06-30 LAB — CBC
HCT: 38.4 % (ref 36.0–46.0)
Hemoglobin: 12.1 g/dL (ref 12.0–15.0)
MCH: 31 pg (ref 26.0–34.0)
MCHC: 31.5 g/dL (ref 30.0–36.0)
MCV: 98.5 fL (ref 80.0–100.0)
Platelets: 191 10*3/uL (ref 150–400)
RBC: 3.9 MIL/uL (ref 3.87–5.11)
RDW: 16.4 % — ABNORMAL HIGH (ref 11.5–15.5)
WBC: 6.7 10*3/uL (ref 4.0–10.5)
nRBC: 0 % (ref 0.0–0.2)

## 2020-06-30 LAB — GLUCOSE, CAPILLARY
Glucose-Capillary: 83 mg/dL (ref 70–99)
Glucose-Capillary: 273 mg/dL — ABNORMAL HIGH (ref 70–99)
Glucose-Capillary: 317 mg/dL — ABNORMAL HIGH (ref 70–99)
Glucose-Capillary: 267 mg/dL — ABNORMAL HIGH (ref 70–99)

## 2020-06-30 LAB — MAGNESIUM: Magnesium: 2.2 mg/dL (ref 1.7–2.4)

## 2020-06-30 MED ORDER — DIGOXIN 0.25 MG/ML IJ SOLN
0.2500 mg | Freq: Four times a day (QID) | INTRAMUSCULAR | Status: AC
  Administered 2020-06-30 – 2020-07-01 (×2): 0.25 mg via INTRAVENOUS
  Filled 2020-06-30 (×2): qty 2

## 2020-06-30 MED ORDER — WARFARIN SODIUM 2.5 MG PO TABS
2.5000 mg | ORAL_TABLET | Freq: Once | ORAL | Status: AC
  Administered 2020-06-30: 2.5 mg via ORAL
  Filled 2020-06-30: qty 1

## 2020-06-30 MED ORDER — AMIODARONE LOAD VIA INFUSION
150.0000 mg | Freq: Once | INTRAVENOUS | Status: AC
  Administered 2020-06-30: 150 mg via INTRAVENOUS
  Filled 2020-06-30: qty 83.34

## 2020-06-30 MED ORDER — DIGOXIN 0.25 MG/ML IJ SOLN
0.5000 mg | Freq: Once | INTRAMUSCULAR | Status: AC
  Administered 2020-06-30: 0.5 mg via INTRAVENOUS
  Filled 2020-06-30: qty 2

## 2020-06-30 NOTE — Progress Notes (Signed)
PROGRESS NOTE    Theresa [name]  UYQ:[id number] DOB: May 02, 1966 DOA: 06/25/2020 PCP: Monico Blitz, MD   Brief Narrative:  Per HPI: Theresa Hubers Mooreis a 54 y.o.femalewith medical history significant forchronic respiratory failure on 4 L, systolic and diastolic CHF, coronary artery disease, DVT, diabetes mellitus, hypertension, psoriasis. Patient was seen today by advanced home care nurse.It was found that patient had gained 14 pounds since she was recently discharged from the hospital, patient was also having increasing difficulty breathing, with bilateral lower extremity swelling and bloating. Patient was referred to the ED. Patient reports compliance with her torsemide40mg  BID,and denies dietary indiscretion. Patient has a trach. She reports productive cough, wheezing since yesterday. No chest pain.  Recent hospitalization 10/9-10/15,for severe sepsis due to E. coli bacteremia,cholecystitis,lactic acid peaked at 2.8, she was treated with ceftriaxone and metronidazole, and transitioned over to Augmentin on discharge. General surgery recommended nonoperative management cholecystitis, as patient is not a good surgical candidate. She was also managed for acute on chronic hypoxic respiratory failure, and decompensated CHF,COPD exacerbation,requiring 10 L of oxygen,she was discharged on her baseline 4 L.  10/21:Patient admitted with acute on chronic combined diastolic and systolic heart failure with acute on chronic hypoxemic respiratory failure. Urine output not sufficient and seen by cardiology with plans to increase Lasix to 60 IV 3 times daily and continue to follow diuresis. She remains on 10 L via trach collar this morning and is still quite short of breath.  10/22:Patient noted to have some ongoing shortness of breath with poorly documented diuresis on current dose of Lasix. This has been increased by cardiology this morning to Lasix 80 mg 3 times  daily.  10/23:Patient continues to remain short of breath on her trach collar with 10 L oxygen supplementation. She still states that she is not having any adequate urine output and is still quite short of breath. Recorded weights appear inaccurate. She appears to have had a net -800 mL fluid output. Plan to continue Lasix and trial metolazone today. May need nephrology consultation by a.m. if not improving.  10/24: Patient noted to develop atrial fibrillation with RVR this morning of new onset.  TSH noted to be 4.4.  She has been transferred to stepdown unit for monitoring and initiated on amiodarone drip given soft blood pressure readings.  No chest pain or shortness of breath or other symptomatology otherwise noted.  She continues to diurese poorly with nephrology recommending Bumex 2 mg IV twice daily along with use of metolazone 5 mg daily once stable.  Troponins with flat trend.  10/25: Patient continues to have elevated heart rates on amiodarone for which cardiology plans to rebolus amiodarone and then try digoxin later if heart rates still remain elevated.  Plan to try IV Bumex for diuresis and hold Aldactone and see how blood pressures are tolerated.  Assessment & Plan:   Principal Problem:   Acute on chronic combined systolic and diastolic HF (heart failure) (HCC) Active Problems:   DM (diabetes mellitus) ([place])   Essential hypertension   OSA (obstructive sleep apnea)   Lupus anticoagulant syndrome (HCC)   CAD (coronary artery disease) of artery bypass graft: PTCA/DES to distal body VG to PDA 10/29/15   Tracheostomy in place [facility]), chronic since 2002   Acute on chronic respiratory failure with hypoxia ([place])   Uncontrolled type 2 diabetes mellitus with hyperglycemia, with long-term current use of insulin (HCC)   Acute on chronic respiratory failure with hypoxia (HCC)   Chronic respiratory insufficiency  DVT (deep venous thrombosis) (HCC)   Acute on chronic hypoxemic  respiratory failure secondary to acute on chronic combined diastolic and systolic heart failure-ongoing -Only on 10 L oxygen via trach collar with 4 L at baseline -She reports compliance with 40 mg torsemide twice daily and did not have sufficient urine output with IV Lasix 60 mgtid, therefore this has been increased to80 mg 3 times daily 10/22 -Currently diuretics are being held on account of arrhythmia.    Plan to try Bumex IV today and hold metolazone and monitor blood pressures carefully. -Follow daily BMP, daily weights, strict I's and O's -Appreciate cardiology evaluation -Hold home losartan and metoprolol with holding parameters due to softening blood pressure readings.  New onset atrial flutter with RVR -With soft blood pressure readings, plan to continue amiodarone drip -Per cardiology, plan to bolus amiodarone and may require digoxin later this afternoon if heart rates are not better controlled -TSH 4.4 -Limited 2D echocardiogram to be ordered by a.m. once better heart rate control noted -Appreciate cardiology evaluation in a.m. -Patient already anticoagulated with warfarin with INR 2.4  Elevated troponin secondary to above in setting of CAD -Likely some mild demand ischemia with flat trend noted and no chest pain  COPD -No acute bronchospasms currently noted -Continue on duo nebs and mucolytic'swith Mucomyst ordered -Flutter valve  Lupus anticoagulant syndrome/history of DVT on chronic anticoagulation -Continue warfarin with pharmacy to dose, PT/INR  History of CABG -No chest pain currently noted -Continue warfarin  Uncontrolled type 2 diabetes likely related to steroid-induced hyperglycemia, improving -Continue SSI while holding Ozempic -Continue home Tresibaat slightly increased dose  Depression -Continue home sertraline and lorazepam   DVT prophylaxis:Warfarin Code Status:Full code Family Communication:Discussed with sister on phone  10/25 Disposition Plan: Status is: Inpatient  Remains inpatient appropriate because:IV treatments appropriate due to intensity of illness or inability to take PO and Inpatient level of care appropriate due to severity of illness   Dispo: The patient is from:Home Anticipated d/c is GG:YIRS Anticipated d/c date is: 2-3days Patient currently is not medically stable to d/c.She requires ongoing diuresis and inpatient management for heart rate control.   Consultants:  Cardiology  Procedures:  See below  Antimicrobials:   None  Subjective: Patient seen and evaluated today with ongoing atrial flutter with poor HR control and soft bp readings.  She continues to have some ongoing chest congestion noted.  Objective: Vitals:   06/30/20 0700 06/30/20 0736 06/30/20 0800 06/30/20 0900  BP: (!) 84/60   (!) 82/65  Pulse: (!) 59  (!) 48 (!) 133  Resp: 17  17 18   Temp:  97.6 F (36.4 C)    TempSrc:  Oral    SpO2: 94%  96% (!) 88%  Weight:      Height:        Intake/Output Summary (Last 24 hours) at 06/30/2020 1005 Last data filed at 06/30/2020 0600 Gross per 24 hour  Intake 942.42 ml  Output 500 ml  Net 442.42 ml   Filed Weights   06/29/20 0500 06/29/20 0858 06/30/20 0500  Weight: 75.2 kg 97.2 kg 97 kg    Examination:  General exam: Appears calm and comfortable  Respiratory system: Clear to auscultation. Respiratory effort normal.  With tracheostomy and trach collar 10 L supplementation. Cardiovascular system: S1 & S2 heard, irregular and tachycardic. Gastrointestinal system: Abdomen is nondistended, soft and nontender.  Central nervous system: Alert and awake Extremities: Diffuse edema bilaterally. Skin: No rashes, lesions or ulcers Psychiatry: Judgement and insight  appear normal. Mood & affect appropriate.     Data Reviewed: I have personally reviewed following labs and imaging studies  CBC: Recent Labs  Lab  06/25/20 1633 06/27/20 0346 06/28/20 0559 06/29/20 0529 06/30/20 0354  WBC 10.3 13.1* 10.0 7.5 6.7  NEUTROABS 9.0*  --   --   --   --   HGB 12.5 13.2 13.1 13.4 12.1  HCT 39.2 42.0 43.0 43.1 38.4  MCV 96.3 96.8 99.5 98.2 98.5  PLT 260 263 201 192 425   Basic Metabolic Panel: Recent Labs  Lab 06/26/20 0337 06/27/20 0346 06/28/20 0559 06/29/20 0529 06/30/20 0354  NA 139 137 138 133* 135  K 3.6 3.6 3.8 3.7 4.1  CL 93* 92* 90* 87* 90*  CO2 35* 33* 36* 34* 35*  GLUCOSE 140* 83 74 218* 123*  BUN 18 18 19  23* 33*  CREATININE 0.63 0.64 0.77 0.97 0.95  CALCIUM 8.4* 8.8* 8.9 8.9 8.8*  MG  --  1.9 2.0 1.9 2.2   GFR: Estimated Creatinine Clearance: 67.6 mL/min (by C-G formula based on SCr of 0.95 mg/dL). Liver Function Tests: Recent Labs  Lab 06/25/20 1633  AST 17  ALT 29  ALKPHOS 173*  BILITOT 0.9  PROT 6.3*  ALBUMIN 2.9*   No results for input(s): LIPASE, AMYLASE in the last 168 hours. No results for input(s): AMMONIA in the last 168 hours. Coagulation Profile: Recent Labs  Lab 06/26/20 0337 06/27/20 0346 06/28/20 0559 06/29/20 0529 06/30/20 0354  INR 1.8* 1.7* 1.9* 2.3* 2.4*   Cardiac Enzymes: No results for input(s): CKTOTAL, CKMB, CKMBINDEX, TROPONINI in the last 168 hours. BNP (last 3 results) No results for input(s): PROBNP in the last 8760 hours. HbA1C: No results for input(s): HGBA1C in the last 72 hours. CBG: Recent Labs  Lab 06/28/20 2237 06/29/20 0808 06/29/20 1626 06/29/20 2042 06/30/20 0738  GLUCAP 174* 142* 219* 240* 83   Lipid Profile: No results for input(s): CHOL, HDL, LDLCALC, TRIG, CHOLHDL, LDLDIRECT in the last 72 hours. Thyroid Function Tests: Recent Labs    06/29/20 0910  TSH 4.443   Anemia Panel: No results for input(s): VITAMINB12, FOLATE, FERRITIN, TIBC, IRON, RETICCTPCT in the last 72 hours. Sepsis Labs: Recent Labs  Lab 06/27/20 1424  PROCALCITON <0.10    Recent Results (from the past 240 hour(s))  Respiratory  Panel by RT PCR (Flu A&B, Covid) - Nasopharyngeal Swab     Status: None   Collection Time: 06/25/20  4:07 PM   Specimen: Nasopharyngeal Swab  Result Value Ref Range Status   SARS Coronavirus 2 by RT PCR NEGATIVE NEGATIVE Final    Comment: (NOTE) SARS-CoV-2 target nucleic acids are NOT DETECTED.  The SARS-CoV-2 RNA is generally detectable in upper respiratoy specimens during the acute phase of infection. The lowest concentration of SARS-CoV-2 viral copies this assay can detect is 131 copies/mL. A negative result does not preclude SARS-Cov-2 infection and should not be used as the sole basis for treatment or other patient management decisions. A negative result may occur with  improper specimen collection/handling, submission of specimen other than nasopharyngeal swab, presence of viral mutation(s) within the areas targeted by this assay, and inadequate number of viral copies (<131 copies/mL). A negative result must be combined with clinical observations, patient history, and epidemiological information. The expected result is Negative.  Fact Sheet for Patients:  PinkCheek.be  Fact Sheet for Healthcare Providers:  GravelBags.it  This test is no t yet approved or cleared by the [place] and  has been authorized for detection and/or diagnosis of SARS-CoV-2 by FDA under an Emergency Use Authorization (EUA). This EUA will remain  in effect (meaning this test can be used) for the duration of the COVID-19 declaration under Section 564(b)(1) of the Act, 21 U.S.C. section 360bbb-3(b)(1), unless the authorization is terminated or revoked sooner.     Influenza A by PCR NEGATIVE NEGATIVE Final   Influenza B by PCR NEGATIVE NEGATIVE Final    Comment: (NOTE) The Xpert Xpress SARS-CoV-2/FLU/RSV assay is intended as an aid in  the diagnosis of influenza from Nasopharyngeal swab specimens and  should not be used as a sole basis  for treatment. Nasal washings and  aspirates are unacceptable for Xpert Xpress SARS-CoV-2/FLU/RSV  testing.  Fact Sheet for Patients: PinkCheek.be  Fact Sheet for Healthcare Providers: GravelBags.it  This test is not yet approved or cleared by the [place] FDA and  has been authorized for detection and/or diagnosis of SARS-CoV-2 by  FDA under an Emergency Use Authorization (EUA). This EUA will remain  in effect (meaning this test can be used) for the duration of the  Covid-19 declaration under Section 564(b)(1) of the Act, 21  U.S.C. section 360bbb-3(b)(1), unless the authorization is  terminated or revoked. Performed at [facility], [address], [place], [place] [address]   MRSA PCR Screening     Status: None   Collection Time: 06/29/20  9:12 AM   Specimen: Nasal Mucosa; Nasopharyngeal  Result Value Ref Range Status   MRSA by PCR NEGATIVE NEGATIVE Final    Comment:        The GeneXpert MRSA Assay (FDA approved for NASAL specimens only), is one component of a comprehensive MRSA colonization surveillance program. It is not intended to diagnose MRSA infection nor to guide or monitor treatment for MRSA infections. Performed at [facility], [address], [place], [place] [address]          Radiology Studies: DG Chest 1 View  Result Date: 06/28/2020 CLINICAL DATA:  Hypoxemia EXAM: CHEST  1 VIEW COMPARISON:  06/25/2020 FINDINGS: Tracheostomy tube is noted in satisfactory position. Cardiac shadow is mildly enlarged but accentuated by the portable technique. Vascular congestion with interstitial opacities are again identified but slightly increased from the prior study. Postsurgical changes in the right base are again seen. No bony abnormality is noted. IMPRESSION: Vascular congestion increased interstitial markings consistent with edema. Electronically Signed   By: Inez Catalina M.D.   On: 06/28/2020 16:19   DG  CHEST PORT 1 VIEW  Result Date: 06/29/2020 CLINICAL DATA:  Sustained supraventricular tachycardia EXAM: PORTABLE CHEST 1 VIEW COMPARISON:  Yesterday FINDINGS: Tracheostomy tube in stable position. Bilateral pulmonary infiltrate. Postoperative right lung base. Chronic cardiomegaly. There has been CABG and coronary stenting. No visible effusion or pneumothorax. IMPRESSION: Stable cardiomegaly and pulmonary infiltrates. Electronically Signed   By: Monte Fantasia M.D.   On: 06/29/2020 10:56        Scheduled Meds: . bumetanide (BUMEX) IV  2 mg Intravenous Q12H  . Chlorhexidine Gluconate Cloth  6 each Topical Daily  . gabapentin  900 mg Oral QHS  . insulin aspart  0-15 Units Subcutaneous TID WC  . insulin aspart  0-5 Units Subcutaneous QHS  . insulin glargine  25 Units Subcutaneous QHS  . metolazone  5 mg Oral Daily  . metoprolol tartrate  5 mg Intravenous Q6H  . pantoprazole  40 mg Oral Daily  . sertraline  50 mg Oral QHS  . tamsulosin  0.4 mg  Oral Daily  . warfarin  2.5 mg Oral ONCE-1600  . Warfarin - Pharmacist Dosing Inpatient   Does not apply q1600   Continuous Infusions: . amiodarone 30 mg/hr (06/30/20 0953)     LOS: 5 days    Time spent: 35 minutes    Carsyn Boster D Manuella Ghazi, DO [facility]  If 7PM-7AM, please contact night-coverage [email or web address] 06/30/2020, 10:05 AM

## 2020-06-30 NOTE — Progress Notes (Signed)
Dr. Josephine Cables notified:  b/p 74/53 (54), HR 122, R 21. Amiodarone @30 /hr, Bumex and metoprolol held.

## 2020-06-30 NOTE — Progress Notes (Signed)
Theresa [name] notified: B/P 80/55 Map 62 HR 115-130, R 15, Spo2 98 on oxygen. Metoprolol 5mg  IV is due.  Hold dose?

## 2020-06-30 NOTE — Progress Notes (Signed)
Progress Note  Patient Name: LORRENE GRAEF Date of Encounter: 06/30/2020 [facility] Cardiologist: [name], MD   Subjective   SOB mildly improved  Inpatient Medications    Scheduled Meds: . bumetanide (BUMEX) IV  2 mg Intravenous Q12H  . Chlorhexidine Gluconate Cloth  6 each Topical Daily  . gabapentin  900 mg Oral QHS  . insulin aspart  0-15 Units Subcutaneous TID WC  . insulin aspart  0-5 Units Subcutaneous QHS  . insulin glargine  25 Units Subcutaneous QHS  . metolazone  5 mg Oral Daily  . metoprolol tartrate  5 mg Intravenous Q6H  . pantoprazole  40 mg Oral Daily  . sertraline  50 mg Oral QHS  . tamsulosin  0.4 mg Oral Daily  . Warfarin - Pharmacist Dosing Inpatient   Does not apply q1600   Continuous Infusions: . amiodarone 30 mg/hr (06/29/20 2159)   PRN Meds: acetaminophen **OR** acetaminophen, guaiFENesin-dextromethorphan, ipratropium-albuterol, LORazepam, ondansetron **OR** ondansetron (ZOFRAN) IV, polyethylene glycol, sodium chloride flush   Vital Signs    Vitals:   06/30/20 0500 06/30/20 0600 06/30/20 0700 06/30/20 0736  BP:  (!) 88/65 (!) 84/60   Pulse: (!) 103 (!) 153 (!) 59   Resp: 19 17 17    Temp:    97.6 F (36.4 C)  TempSrc:    Oral  SpO2: 99% 99% 94%   Weight: 97 kg     Height:        Intake/Output Summary (Last 24 hours) at 06/30/2020 0818 Last data filed at 06/30/2020 0600 Gross per 24 hour  Intake 942.42 ml  Output 500 ml  Net 442.42 ml   Last 3 Weights 06/30/2020 06/29/2020 06/29/2020  Weight (lbs) 213 lb 13.5 oz 214 lb 4.6 oz 165 lb 12.6 oz  Weight (kg) 97 kg 97.2 kg 75.2 kg      Telemetry    Aflutter rvr- Personally Reviewed  ECG    n/a - Personally Reviewed  Physical Exam   GEN: No acute distress.   Neck: No JVD Cardiac: tachy, no murmurs, rubs, or gallops.  Respiratory: coarse bilaterally GI: Soft, nontender, non-distended  MS: No edema; No deformity. Neuro:  Nonfocal  Psych: Normal affect   Labs     High Sensitivity Troponin:   Recent Labs  Lab 06/29/20 0910 06/29/20 1113  TROPONINIHS 24* 31*      Chemistry Recent Labs  Lab 06/25/20 1633 06/26/20 0337 06/28/20 0559 06/29/20 0529 06/30/20 0354  NA 134*   < > 138 133* 135  K 3.5   < > 3.8 3.7 4.1  CL 91*   < > 90* 87* 90*  CO2 33*   < > 36* 34* 35*  GLUCOSE 473*   < > 74 218* 123*  BUN 17   < > 19 23* 33*  CREATININE 0.78   < > 0.77 0.97 0.95  CALCIUM 7.9*   < > 8.9 8.9 8.8*  PROT 6.3*  --   --   --   --   ALBUMIN 2.9*  --   --   --   --   AST 17  --   --   --   --   ALT 29  --   --   --   --   ALKPHOS 173*  --   --   --   --   BILITOT 0.9  --   --   --   --   GFRNONAA >60   < > >60 >  60 >60  ANIONGAP 10   < > 12 12 10    < > = values in this interval not displayed.     Hematology Recent Labs  Lab 06/28/20 0559 06/29/20 0529 06/30/20 0354  WBC 10.0 7.5 6.7  RBC 4.32 4.39 3.90  HGB 13.1 13.4 12.1  HCT 43.0 43.1 38.4  MCV 99.5 98.2 98.5  MCH 30.3 30.5 31.0  MCHC 30.5 31.1 31.5  RDW 16.8* 16.4* 16.4*  PLT 201 192 191    BNP Recent Labs  Lab 06/25/20 1633  BNP 536.0*     DDimer No results for input(s): DDIMER in the last 168 hours.   Radiology    DG Chest 1 View  Result Date: 06/28/2020 CLINICAL DATA:  Hypoxemia EXAM: CHEST  1 VIEW COMPARISON:  06/25/2020 FINDINGS: Tracheostomy tube is noted in satisfactory position. Cardiac shadow is mildly enlarged but accentuated by the portable technique. Vascular congestion with interstitial opacities are again identified but slightly increased from the prior study. Postsurgical changes in the right base are again seen. No bony abnormality is noted. IMPRESSION: Vascular congestion increased interstitial markings consistent with edema. Electronically Signed   By: Inez Catalina M.D.   On: 06/28/2020 16:19   DG CHEST PORT 1 VIEW  Result Date: 06/29/2020 CLINICAL DATA:  Sustained supraventricular tachycardia EXAM: PORTABLE CHEST 1 VIEW COMPARISON:  Yesterday  FINDINGS: Tracheostomy tube in stable position. Bilateral pulmonary infiltrate. Postoperative right lung base. Chronic cardiomegaly. There has been CABG and coronary stenting. No visible effusion or pneumothorax. IMPRESSION: Stable cardiomegaly and pulmonary infiltrates. Electronically Signed   By: Monte Fantasia M.D.   On: 06/29/2020 10:56    Cardiac Studies    Patient Profile     Naryiah Schley Mooreis a 54 y.o.femalewith a hx of chronic combined systyolic/diastolic HF, CAD with prior CABG, chronic respiratory failure with chronic trach,who is being seen today for the evaluation of SOB and leg edemaat the request of Dr Manuella Ghazi  Assessment & Plan    1. Acute on chronic combined systolic/diastolic HF - 12/6960 echo LVEF 40-45%, grade III DDx, normal RV function - admitted with 14 lbs weight gainfrom home report (from [facility] weights 5 lbs since discharge last week), LE edema. BNP in the 500s, pulm edema on cxr  I/Os are incomplete. Hospital weights are inaccurate.  - from primary note nephrology had recommended IV bumex and metolazone. Slight uptrend in Cr/BUN. Appears bumex/metolazone was held yesterday - with sofbt bp's 100s/60 would try IV bumex today without metolazone.   2. Aflutter with RVR - new diagnosis this admission - soft bp's, she was started on amiodarone over the weekend - has already been on coumadin for history of prior DVT/Lupus antiocoagulant  06/25/20 INR was 1.8, 10/13 INR 1.6. she would require TEE if cardioverted.  - on 10 L trach collar, respiratory status not stable for TEE at this time.   - rebolus amio, if ongiong tachycardia in the afternoon would load IV digoxin 0.5mg  x 1 then 0.25mg  IV every 6 hours for additional 2 doses (total of 1 mg).   3. Chronic respiraotry failure - has chronic trach, baseline 4L O2 at home  4. CAD - mild trop in setting of RVR, hypoxia - no plans for ischemic testing at this time  5. Hypotension Manual bp 100/65, very low  bp'st his AM from loose wrist cuff that appear inaccurate.     For questions or updates, please contact [place] Please consult [email or web address] for contact info under  Merrily Pew, MD  06/30/2020, 8:18 AM

## 2020-06-30 NOTE — Progress Notes (Signed)
ANTICOAGULATION CONSULT NOTE -   Pharmacy Consult for warfarin Indication: lupus anticoagulant and hx VTE  Patient Measurements: Height: 4\' 10"  (147.3 cm) Weight: 97 kg (213 lb 13.5 oz) IBW/kg (Calculated) : 40.9  Vital Signs: Temp: 97.6 F (36.4 C) (10/25 0736) Temp Source: Oral (10/25 0736) BP: 84/60 (10/25 0700) Pulse Rate: 59 (10/25 0700)  Labs: Recent Labs    06/28/20 0559 06/28/20 0559 06/29/20 0529 06/29/20 0910 06/29/20 1113 06/30/20 0354  HGB 13.1   < > 13.4  --   --  12.1  HCT 43.0  --  43.1  --   --  38.4  PLT 201  --  192  --   --  191  LABPROT 20.9*  --  24.5*  --   --  25.0*  INR 1.9*  --  2.3*  --   --  2.4*  CREATININE 0.77  --  0.97  --   --  0.95  TROPONINIHS  --   --   --  24* 31*  --    < > = values in this interval not displayed.    Estimated Creatinine Clearance: 67.6 mL/min (by C-G formula based on SCr of 0.95 mg/dL).  Assessment: 31 yof who presents to the ED with increasing SOB and bilateral leg swelling. She was recently discharged from the hospital on 10/15. She takes warfarin PTA for lupus anticoagulant and hx VTE. Pharmacy consulted to dose warfarin while inpatient. With addition of amiodarone on 10/24 will likely interact with coumadin and dose amount will be less.   Home dose:  2.5 mg daily except 5 mg MWF 10/22: On 10/20 Coumadin 2.5mg  dose not given. So on 10/21 7.5mg  given.  INR is therapeutic at 2.4 today  Goal of Therapy:  INR 2-3 Monitor platelets by anticoagulation protocol: Yes   Plan:  Give warfarin 2.5 mg po x1 dose today Daily INR and every other day CBC Monitor for signs and symptoms  of bleeding  Thank you for involving pharmacy in this patient's care.  Isac Sarna, BS Pharm D, BCPS Clinical Pharmacist Pager [phone] 06/30/2020 9:12 AM

## 2020-07-01 ENCOUNTER — Ambulatory Visit: Payer: Medicare Other | Admitting: Cardiology

## 2020-07-01 DIAGNOSIS — I4892 Unspecified atrial flutter: Secondary | ICD-10-CM | POA: Diagnosis not present

## 2020-07-01 DIAGNOSIS — I5043 Acute on chronic combined systolic (congestive) and diastolic (congestive) heart failure: Secondary | ICD-10-CM | POA: Diagnosis not present

## 2020-07-01 LAB — BASIC METABOLIC PANEL
Anion gap: 10 (ref 5–15)
BUN: 30 mg/dL — ABNORMAL HIGH (ref 6–20)
CO2: 37 mmol/L — ABNORMAL HIGH (ref 22–32)
Calcium: 8.4 mg/dL — ABNORMAL LOW (ref 8.9–10.3)
Chloride: 90 mmol/L — ABNORMAL LOW (ref 98–111)
Creatinine, Ser: 0.81 mg/dL (ref 0.44–1.00)
GFR, Estimated: >60 mL/min (ref >60)
Glucose, Bld: 233 mg/dL — ABNORMAL HIGH (ref 70–99)
Potassium: 3.5 mmol/L (ref 3.5–5.1)
Sodium: 137 mmol/L (ref 135–145)

## 2020-07-01 LAB — CBC
HCT: 37.4 % (ref 36.0–46.0)
Hemoglobin: 11.9 g/dL — ABNORMAL LOW (ref 12.0–15.0)
MCH: 30.9 pg (ref 26.0–34.0)
MCHC: 31.8 g/dL (ref 30.0–36.0)
MCV: 97.1 fL (ref 80.0–100.0)
Platelets: 193 10*3/uL (ref 150–400)
RBC: 3.85 MIL/uL — ABNORMAL LOW (ref 3.87–5.11)
RDW: 15.9 % — ABNORMAL HIGH (ref 11.5–15.5)
WBC: 6.9 10*3/uL (ref 4.0–10.5)
nRBC: 0 % (ref 0.0–0.2)

## 2020-07-01 LAB — PROTIME-INR
INR: 2.4 — ABNORMAL HIGH (ref 0.8–1.2)
Prothrombin Time: 25.2 seconds — ABNORMAL HIGH (ref 11.4–15.2)

## 2020-07-01 LAB — GLUCOSE, CAPILLARY
Glucose-Capillary: 131 mg/dL — ABNORMAL HIGH (ref 70–99)
Glucose-Capillary: 425 mg/dL — ABNORMAL HIGH (ref 70–99)
Glucose-Capillary: 438 mg/dL — ABNORMAL HIGH (ref 70–99)
Glucose-Capillary: 122 mg/dL — ABNORMAL HIGH (ref 70–99)
Glucose-Capillary: 144 mg/dL — ABNORMAL HIGH (ref 70–99)

## 2020-07-01 LAB — MAGNESIUM: Magnesium: 1.9 mg/dL (ref 1.7–2.4)

## 2020-07-01 MED ORDER — INSULIN GLARGINE 100 UNIT/ML ~~LOC~~ SOLN
30.0000 [IU] | Freq: Every day | SUBCUTANEOUS | Status: DC
  Administered 2020-07-01 – 2020-07-02 (×2): 30 [IU] via SUBCUTANEOUS
  Filled 2020-07-01 (×2): qty 0.3

## 2020-07-01 MED ORDER — LEVALBUTEROL HCL 0.63 MG/3ML IN NEBU: 0.6300 mg | INHALATION_SOLUTION | Freq: Four times a day (QID) | RESPIRATORY_TRACT | Status: DC | PRN

## 2020-07-01 MED ORDER — INSULIN ASPART 100 UNIT/ML ~~LOC~~ SOLN
0.0000 [IU] | Freq: Every day | SUBCUTANEOUS | Status: DC
  Administered 2020-07-06: 2 [IU] via SUBCUTANEOUS

## 2020-07-01 MED ORDER — INSULIN ASPART 100 UNIT/ML IV SOLN
10.0000 [IU] | Freq: Once | INTRAVENOUS | Status: AC
  Administered 2020-07-01: 10 [IU] via INTRAVENOUS

## 2020-07-01 MED ORDER — DIGOXIN 125 MCG PO TABS
0.2500 mg | ORAL_TABLET | Freq: Every day | ORAL | Status: DC
  Administered 2020-07-01 – 2020-07-06 (×6): 0.25 mg via ORAL
  Filled 2020-07-01 (×7): qty 2

## 2020-07-01 MED ORDER — INSULIN ASPART 100 UNIT/ML ~~LOC~~ SOLN
3.0000 [IU] | Freq: Three times a day (TID) | SUBCUTANEOUS | Status: DC
  Administered 2020-07-01 – 2020-07-02 (×3): 3 [IU] via SUBCUTANEOUS

## 2020-07-01 MED ORDER — WARFARIN SODIUM 2.5 MG PO TABS
2.5000 mg | ORAL_TABLET | Freq: Once | ORAL | Status: AC
  Administered 2020-07-01: 2.5 mg via ORAL
  Filled 2020-07-01: qty 1

## 2020-07-01 MED ORDER — INSULIN ASPART 100 UNIT/ML ~~LOC~~ SOLN
0.0000 [IU] | Freq: Three times a day (TID) | SUBCUTANEOUS | Status: DC
  Administered 2020-07-01: 3 [IU] via SUBCUTANEOUS
  Administered 2020-07-01: 20 [IU] via SUBCUTANEOUS
  Administered 2020-07-02: 10 [IU] via SUBCUTANEOUS
  Administered 2020-07-02: 4 [IU] via SUBCUTANEOUS
  Administered 2020-07-03: 15 [IU] via SUBCUTANEOUS
  Administered 2020-07-03: 3 [IU] via SUBCUTANEOUS
  Administered 2020-07-04: 7 [IU] via SUBCUTANEOUS
  Administered 2020-07-04: 15 [IU] via SUBCUTANEOUS
  Administered 2020-07-05 (×3): 3 [IU] via SUBCUTANEOUS
  Administered 2020-07-06: 7 [IU] via SUBCUTANEOUS
  Administered 2020-07-06: 3 [IU] via SUBCUTANEOUS
  Administered 2020-07-06: 7 [IU] via SUBCUTANEOUS
  Administered 2020-07-07: 3 [IU] via SUBCUTANEOUS

## 2020-07-01 NOTE — Progress Notes (Addendum)
Progress Note  Patient Name: Theresa [name] Date of Encounter: 07/01/2020  [facility] Cardiologist: [name], MD    Subjective   Feeling much better this am. Breathing easier.   Inpatient Medications    Scheduled Meds: . bumetanide (BUMEX) IV  2 mg Intravenous Q12H  . Chlorhexidine Gluconate Cloth  6 each Topical Daily  . gabapentin  900 mg Oral QHS  . insulin aspart  0-20 Units Subcutaneous TID WC  . insulin aspart  0-5 Units Subcutaneous QHS  . insulin glargine  30 Units Subcutaneous QHS  . metoprolol tartrate  5 mg Intravenous Q6H  . pantoprazole  40 mg Oral Daily  . sertraline  50 mg Oral QHS  . tamsulosin  0.4 mg Oral Daily  . Warfarin - Pharmacist Dosing Inpatient   Does not apply q1600   Continuous Infusions: . amiodarone 30 mg/hr (07/01/20 0516)   PRN Meds: acetaminophen **OR** acetaminophen, guaiFENesin-dextromethorphan, ipratropium-albuterol, LORazepam, ondansetron **OR** ondansetron (ZOFRAN) IV, polyethylene glycol, sodium chloride flush   Vital Signs    Vitals:   07/01/20 0630 07/01/20 0700 07/01/20 0730 07/01/20 0747  BP: 106/80 (!) 154/126 (!) 102/58 (!) 102/58  Pulse: 80 (!) 25 74 (!) 103  Resp:    18  Temp:      TempSrc:      SpO2: 97% 97% 97% 98%  Weight:      Height:        Intake/Output Summary (Last 24 hours) at 07/01/2020 0830 Last data filed at 07/01/2020 0076 Gross per 24 hour  Intake --  Output 1600 ml  Net -1600 ml   Last 3 Weights 06/30/2020 06/29/2020 06/29/2020  Weight (lbs) 213 lb 13.5 oz 214 lb 4.6 oz 165 lb 12.6 oz  Weight (kg) 97 kg 97.2 kg 75.2 kg      Telemetry    Afib/flutter 95-116/m - Personally Reviewed  ECG       Physical Exam    GEN: No acute distress.   Neck: No JVD Cardiac: RRR, no murmurs, rubs, or gallops.  Respiratory: decreased breath sounds with rales and rhonchi at bases GI: Soft, nontender, non-distended  MS: slight edema L>R; No deformity. Neuro:  Nonfocal  Psych: Normal affect    Labs    High Sensitivity Troponin:   Recent Labs  Lab 06/29/20 0910 06/29/20 1113  TROPONINIHS 24* 31*      Chemistry Recent Labs  Lab 06/25/20 1633 06/26/20 0337 06/29/20 0529 06/30/20 0354 07/01/20 0319  NA 134*   < > 133* 135 137  K 3.5   < > 3.7 4.1 3.5  CL 91*   < > 87* 90* 90*  CO2 33*   < > 34* 35* 37*  GLUCOSE 473*   < > 218* 123* 233*  BUN 17   < > 23* 33* 30*  CREATININE 0.78   < > 0.97 0.95 0.81  CALCIUM 7.9*   < > 8.9 8.8* 8.4*  PROT 6.3*  --   --   --   --   ALBUMIN 2.9*  --   --   --   --   AST 17  --   --   --   --   ALT 29  --   --   --   --   ALKPHOS 173*  --   --   --   --   BILITOT 0.9  --   --   --   --   GFRNONAA >60   < > >  60 >60 >60  ANIONGAP 10   < > 12 10 10    < > = values in this interval not displayed.     Hematology Recent Labs  Lab 06/29/20 0529 06/30/20 0354 07/01/20 0319  WBC 7.5 6.7 6.9  RBC 4.39 3.90 3.85*  HGB 13.4 12.1 11.9*  HCT 43.1 38.4 37.4  MCV 98.2 98.5 97.1  MCH 30.5 31.0 30.9  MCHC 31.1 31.5 31.8  RDW 16.4* 16.4* 15.9*  PLT 192 191 193    BNP Recent Labs  Lab 06/25/20 1633  BNP 536.0*     DDimer No results for input(s): DDIMER in the last 168 hours.   Radiology    No results found.  Cardiac Studies   Echo 04/20/20  FINDINGS   Left Ventricle: Left ventricular ejection fraction, by estimation, is 40  to 45%. The left ventricle has mildly decreased function. The left  ventricle demonstrates regional wall motion abnormalities. Definity  contrast agent was given IV to delineate the  left ventricular endocardial borders. The left ventricular internal cavity  size was normal in size. There is no left ventricular hypertrophy.  Abnormal (paradoxical) septal motion consistent with post-operative  status. Left ventricular diastolic  parameters are consistent with Grade III diastolic dysfunction  (restrictive). Elevated left atrial pressure.    Patient Profile     54 y.o. female with a hx of  chronic combined systyolic/diastolic HF, CAD with prior CABG, chronic respiratory failure with chronic trach, who is being seen today for the evaluation of SOB and leg edema at the request of Dr Manuella Ghazi     Assessment & Plan      1. Acute on chronic combined systolic/diastolic HF - 04/5630 echo LVEF 40-45%, grade III DDx, normal RV function - admitted with 14 lbs weight gain from home report (from [facility] weights 5 lbs since discharge last week), LE edema. BNP in the 500s, pulm edema on cxr   I/Os have been incomplete but  -1600 cc past 24 hrs. Hospital weights are inaccurate.  - from primary note nephrology had recommended IV bumex and metolazone. Slight uptrend in Cr/BUN. Appears bumex/metolazone was held yesterday - with sofbt bp's 100s/60   IV bumex 2 mg IV BID given yesterday without metolazone. Breathing better today. Continue current dose today.   2. Aflutter with RVR - new diagnosis this admission - soft bp's, she was started on amiodarone over the weekend - has already been on coumadin for history of prior DVT/Lupus antiocoagulant   06/25/20 INR was 1.8, 10/13 INR 1.6. she would require TEE if cardioverted.  - on 10 L trach collar, respiratory status not stable for TEE at this time.    - rebolus amio load yest and IV digoxin 0.5mg  x 1 then 0.25mg  IV every 6 hours for additional 2 doses (total of 1 mg) given. HR in 90's sometime up to 115, but mostly 90's     3. Chronic respiraotry failure - has chronic trach, baseline 4L O2 at home   4. CAD - mild trop in setting of RVR, hypoxia - no plans for ischemic testing at this time   5. Hypotension Running 96/50    For questions or updates, please contact [place] Please consult [email or web address] for contact info under   Attending note Patient seen and discussed with PA Bonnell Public, I agree with her documentation. Aflutter with RVR, soft bp's have complicated management, has been on amio gtt. Loaded with IV digoxin yesterday total  of 1mg . On IV lopressor 5mg   every 6 hours. Would plan for TEE/DCCV once respiratory status improved, on coumadin at home but recent subtherapeutic INRs. Acute on chronic combined systolic/diastolic HF, repeat echo when rates better controlled. Diuresis also limited by soft bp's. Limited I/Os but at least 1.6L of uop from second shift, she is on bumex 2mg  bid though only received evening dose yesterday due to bp's. Downtrend in Cr with diuresis consistent with venous congestion and CHF. Continue IV bumex today, in general bp's are improved and tolerating. Probably echo tomorrow if heart rates continue to improve.       Signed, Ermalinda Barrios, PA-C  07/01/2020, 8:30 AM

## 2020-07-01 NOTE — Progress Notes (Signed)
ANTICOAGULATION CONSULT NOTE -   Pharmacy Consult for warfarin Indication: lupus anticoagulant and hx VTE  Patient Measurements: Height: 4\' 10"  (147.3 cm) Weight: 97 kg (213 lb 13.5 oz) IBW/kg (Calculated) : 40.9  Vital Signs: Temp: 97.9 F (36.6 C) (10/26 0900) Temp Source: Oral (10/26 0900) BP: 122/82 (10/26 1030) Pulse Rate: 93 (10/26 1030)  Labs: Recent Labs    06/29/20 0529 06/29/20 0529 06/29/20 0910 06/29/20 1113 06/30/20 0354 07/01/20 0319  HGB 13.4   < >  --   --  12.1 11.9*  HCT 43.1  --   --   --  38.4 37.4  PLT 192  --   --   --  191 193  LABPROT 24.5*  --   --   --  25.0* 25.2*  INR 2.3*  --   --   --  2.4* 2.4*  CREATININE 0.97  --   --   --  0.95 0.81  TROPONINIHS  --   --  24* 31*  --   --    < > = values in this interval not displayed.    Estimated Creatinine Clearance: 79.3 mL/min (by C-G formula based on SCr of 0.81 mg/dL).  Assessment: 31 yof who presents to the ED with increasing SOB and bilateral leg swelling. She was recently discharged from the hospital on 10/15. She takes warfarin PTA for lupus anticoagulant and hx VTE. Pharmacy consulted to dose warfarin while inpatient. With addition of amiodarone on 10/24 will likely interact with coumadin and dose amount will be less.   Home dose:  2.5 mg daily except 5 mg MWF 10/22: On 10/20 Coumadin 2.5mg  dose not given. So on 10/21 7.5mg  given.  INR is therapeutic at 2.4 today  Goal of Therapy:  INR 2-3 Monitor platelets by anticoagulation protocol: Yes   Plan:  Give warfarin 2.5 mg po x1 dose today Daily INR and every other day CBC Monitor for signs and symptoms  of bleeding  Thank you for involving pharmacy in this patient's care.  Margot Ables, PharmD Clinical Pharmacist 07/01/2020 10:45 AM

## 2020-07-01 NOTE — Progress Notes (Signed)
Patient's lunch time CBG was elevated at 427. Patient consumed brownie after breakfast that husband brought from home. I have noticed husband has been bringing sweets for her. I notified the patient and husband about the high reading and educated them on the proper diet for her.Sliding scale insulin was given and Dr. Manuella Ghazi was notified.

## 2020-07-01 NOTE — Progress Notes (Signed)
Inpatient Diabetes Program Recommendations  AACE/ADA: New Consensus Statement on Inpatient Glycemic Control (2015)  Target Ranges:  Prepandial:   less than 140 mg/dL      Peak postprandial:   less than 180 mg/dL (1-2 hours)      Critically ill patients:  140 - 180 mg/dL   Lab Results  Component Value Date   GLUCAP 425 (H) 07/01/2020   HGBA1C 8.9 (H) 06/16/2020    Review of Glycemic Control Results for CHRISTIANNA, BELMONTE (MRN [id number]) as of 07/01/2020 12:24  Ref. Range 06/30/2020 21:06 07/01/2020 08:05 07/01/2020 11:34  Glucose-Capillary Latest Ref Range: 70 - 99 mg/dL 267 (H) 131 (H) 425 (H)   Diabetes history: Type 2 DM Outpatient Diabetes medications: Tresiba 50 units QHS, Novolog 1-10 units TID, Ozempic 0.5 mg Qwed Current orders for Inpatient glycemic control: Novolog 0-20 units TID, Novolog 0-5 units QHS, Lantus 30 units QHS  Inpatient Diabetes Program Recommendations:    Noted lunch time CBG of >400's mg/dL. Reached out to RN to verify validity and evaluate patient's CHO consumption. Per RN, husband has brought patient multiple sweets and patient has consumed brownies this AM. Has been counseled.  Noted increase to Lantus.   Looking at trends post prandially from yesterday, consider adding Novolog 3 units TID (asusumign patient consumes >50% of meals).   Thanks, Bronson Curb, MSN, RNC-OB Diabetes Coordinator [phone] (8a-5p)

## 2020-07-01 NOTE — Progress Notes (Signed)
Rechecked patients CBG 1 hour after administering 20 units of insulin per sliding scale. Reading was elevated at 438. Dr. Manuella Ghazi notified and awaiting orders.

## 2020-07-01 NOTE — Progress Notes (Signed)
PROGRESS NOTE    Theresa [name]  SAY:[id number] DOB: [birth year] DOA: 06/25/2020 PCP: Monico Blitz, MD   Brief Narrative:  Per HPI: Theresa Mccamy Mooreis a 54 y.o.femalewith medical history significant forchronic respiratory failure on 4 L, systolic and diastolic CHF, coronary artery disease, DVT, diabetes mellitus, hypertension, psoriasis. Patient was seen today by advanced home care nurse.It was found that patient had gained 14 pounds since she was recently discharged from the hospital, patient was also having increasing difficulty breathing, with bilateral lower extremity swelling and bloating. Patient was referred to the ED. Patient reports compliance with her torsemide40mg  BID,and denies dietary indiscretion. Patient has a trach. She reports productive cough, wheezing since yesterday. No chest pain.  Recent hospitalization 10/9-10/15,for severe sepsis due to E. coli bacteremia,cholecystitis,lactic acid peaked at 2.8, she was treated with ceftriaxone and metronidazole, and transitioned over to Augmentin on discharge. General surgery recommended nonoperative management cholecystitis, as patient is not a good surgical candidate. She was also managed for acute on chronic hypoxic respiratory failure, and decompensated CHF,COPD exacerbation,requiring 10 L of oxygen,she was discharged on her baseline 4 L.  -Patient admitted with acute on chronic combined diastolic and systolic heart failure with associated acute on chronic hypoxemic respiratory failure.  She was having difficulty with diuresis on IV Lasix and doses were being increased up to IV 80 mg 3 times daily.  Unfortunately on 10/24 she developed new onset atrial flutter with RVR and was transferred to stepdown unit and started on amiodarone drip on account of soft blood pressure readings.  She continued to have elevated heart rate readings for which repeat bolus and digoxin were given on 10/25 with improvement in blood pressure  readings.  She is now starting to diurese better with IV Bumex and maintains controlled heart rates this morning.  Assessment & Plan:   Principal Problem:   Acute on chronic combined systolic and diastolic HF (heart failure) (HCC) Active Problems:   DM (diabetes mellitus) ([place])   Essential hypertension   OSA (obstructive sleep apnea)   Lupus anticoagulant syndrome (HCC)   CAD (coronary artery disease) of artery bypass graft: PTCA/DES to distal body VG to PDA 10/29/15   Tracheostomy in place [facility]), chronic since 2002   Acute on chronic respiratory failure with hypoxia ([place])   Uncontrolled type 2 diabetes mellitus with hyperglycemia, with long-term current use of insulin (HCC)   Acute on chronic respiratory failure with hypoxia (HCC)   Chronic respiratory insufficiency   DVT (deep venous thrombosis) (HCC)   Acute on chronic hypoxemic respiratory failure secondary to acute on chronic combined diastolic and systolic heart failure-ongoing -Only on 10 L oxygen via trach collar and 3 L nasal cannula.  She wears 4 L total at baseline. -She reports compliance with 40 mg torsemide twice daily at home -Continue diuresis with Bumex as ordered 2 mg IV twice daily with -1600 mL output noted overnight; plan to start using metolazone if urine output diminishes -Follow daily BMP, daily weights, strict I's and O's -Appreciate cardiology evaluation -Holding home losartan and metoprolol with holding parameters due to softening blood pressure readings.  New onset atrial flutter with RVR -With soft blood pressure readings, plan to continue amiodarone drip -Appreciate cardiology recommendations for further management -TSH 4.4 -Patient already anticoagulated with warfarin with INR 2.4  Elevated troponin secondary to above in setting of CAD -Likely some mild demand ischemia with flat trend noted and no chest pain  COPD -No acute bronchospasms currently noted -Continue mucolytic's -Duo nebs to  Xopenex as needed for shortness of breath or wheezing -Flutter valve  Lupus anticoagulant syndrome/history of DVT on chronic anticoagulation -Continue warfarin with pharmacy to dose, PT/INR  History of CABG -No chest pain currently noted -Continue warfarin  Uncontrolled type 2 diabetes likely related to steroid-induced hyperglycemia, improving -Continue SSI on aggressive scale while holding Ozempic -Continue home Tresibaatslightly increased dose of 30 units tonight from 25 units  Depression -Continue home sertraline and lorazepam   DVT prophylaxis:Warfarin Code Status:Full code Family Communication:Discussed with sister on phone 10/26 Disposition Plan: Status is: Inpatient  Remains inpatient appropriate because:IV treatments appropriate due to intensity of illness or inability to take PO and Inpatient level of care appropriate due to severity of illness   Dispo: The patient is from:Home Anticipated d/c is XT:[facility] Anticipated d/c date is: 2-3days Patient currently is not medically stable to d/c.She requires ongoing diuresis and inpatient managementfor heart rate control.   Consultants:  Cardiology  Procedures:  See below  Antimicrobials:   None  Subjective: Patient seen and evaluated today with improvement in breathing noted this morning with less secretions.  Increased urine output noted as well.  Heart rates more stable as well as blood pressure readings.  Objective: Vitals:   07/01/20 0630 07/01/20 0700 07/01/20 0730 07/01/20 0747  BP: 106/80 (!) 154/126 (!) 102/58 (!) 102/58  Pulse: 80 (!) 25 74 (!) 103  Resp:    18  Temp:      TempSrc:      SpO2: 97% 97% 97% 98%  Weight:      Height:        Intake/Output Summary (Last 24 hours) at 07/01/2020 0906 Last data filed at 07/01/2020 9735 Gross per 24 hour  Intake --  Output 1600 ml  Net -1600 ml   Filed Weights   06/29/20 0500 06/29/20  0858 06/30/20 0500  Weight: 75.2 kg 97.2 kg 97 kg    Examination:  General exam: Appears calm and comfortable, obese Respiratory system: Clear to auscultation. Respiratory effort normal.  Tracheostomy with trach collar at 10 L in place as well as nasal cannula 3 L. Cardiovascular system: S1 & S2 heard, irregular, but rate controlled Gastrointestinal system: Abdomen is nondistended, soft and nontender.  Central nervous system: Alert and awake Extremities: Diffuse/scant bilateral edema Skin: No rashes, lesions or ulcers Psychiatry: Judgement and insight appear normal. Mood & affect appropriate.     Data Reviewed: I have personally reviewed following labs and imaging studies  CBC: Recent Labs  Lab 06/25/20 1633 06/25/20 1633 06/27/20 0346 06/28/20 0559 06/29/20 0529 06/30/20 0354 07/01/20 0319  WBC 10.3   < > 13.1* 10.0 7.5 6.7 6.9  NEUTROABS 9.0*  --   --   --   --   --   --   HGB 12.5   < > 13.2 13.1 13.4 12.1 11.9*  HCT 39.2   < > 42.0 43.0 43.1 38.4 37.4  MCV 96.3   < > 96.8 99.5 98.2 98.5 97.1  PLT 260   < > 263 201 192 191 193   < > = values in this interval not displayed.   Basic Metabolic Panel: Recent Labs  Lab 06/27/20 0346 06/28/20 0559 06/29/20 0529 06/30/20 0354 07/01/20 0319  NA 137 138 133* 135 137  K 3.6 3.8 3.7 4.1 3.5  CL 92* 90* 87* 90* 90*  CO2 33* 36* 34* 35* 37*  GLUCOSE 83 74 218* 123* 233*  BUN 18 19 23* 33* 30*  CREATININE 0.64 0.77 0.97  0.95 0.81  CALCIUM 8.8* 8.9 8.9 8.8* 8.4*  MG 1.9 2.0 1.9 2.2 1.9   GFR: Estimated Creatinine Clearance: 79.3 mL/min (by C-G formula based on SCr of 0.81 mg/dL). Liver Function Tests: Recent Labs  Lab 06/25/20 1633  AST 17  ALT 29  ALKPHOS 173*  BILITOT 0.9  PROT 6.3*  ALBUMIN 2.9*   No results for input(s): LIPASE, AMYLASE in the last 168 hours. No results for input(s): AMMONIA in the last 168 hours. Coagulation Profile: Recent Labs  Lab 06/27/20 0346 06/28/20 0559 06/29/20 0529  06/30/20 0354 07/01/20 0319  INR 1.7* 1.9* 2.3* 2.4* 2.4*   Cardiac Enzymes: No results for input(s): CKTOTAL, CKMB, CKMBINDEX, TROPONINI in the last 168 hours. BNP (last 3 results) No results for input(s): PROBNP in the last 8760 hours. HbA1C: No results for input(s): HGBA1C in the last 72 hours. CBG: Recent Labs  Lab 06/30/20 0738 06/30/20 1106 06/30/20 1607 06/30/20 2106 07/01/20 0805  GLUCAP 83 273* 317* 267* 131*   Lipid Profile: No results for input(s): CHOL, HDL, LDLCALC, TRIG, CHOLHDL, LDLDIRECT in the last 72 hours. Thyroid Function Tests: Recent Labs    06/29/20 0910  TSH 4.443   Anemia Panel: No results for input(s): VITAMINB12, FOLATE, FERRITIN, TIBC, IRON, RETICCTPCT in the last 72 hours. Sepsis Labs: Recent Labs  Lab 06/27/20 1424  PROCALCITON <0.10    Recent Results (from the past 240 hour(s))  Respiratory Panel by RT PCR (Flu A&B, Covid) - Nasopharyngeal Swab     Status: None   Collection Time: 06/25/20  4:07 PM   Specimen: Nasopharyngeal Swab  Result Value Ref Range Status   SARS Coronavirus 2 by RT PCR NEGATIVE NEGATIVE Final    Comment: (NOTE) SARS-CoV-2 target nucleic acids are NOT DETECTED.  The SARS-CoV-2 RNA is generally detectable in upper respiratoy specimens during the acute phase of infection. The lowest concentration of SARS-CoV-2 viral copies this assay can detect is 131 copies/mL. A negative result does not preclude SARS-Cov-2 infection and should not be used as the sole basis for treatment or other patient management decisions. A negative result may occur with  improper specimen collection/handling, submission of specimen other than nasopharyngeal swab, presence of viral mutation(s) within the areas targeted by this assay, and inadequate number of viral copies (<131 copies/mL). A negative result must be combined with clinical observations, patient history, and epidemiological information. The expected result is Negative.  Fact  Sheet for Patients:  PinkCheek.be  Fact Sheet for Healthcare Providers:  GravelBags.it  This test is no t yet approved or cleared by the [place] FDA and  has been authorized for detection and/or diagnosis of SARS-CoV-2 by FDA under an Emergency Use Authorization (EUA). This EUA will remain  in effect (meaning this test can be used) for the duration of the COVID-19 declaration under Section 564(b)(1) of the Act, 21 U.S.C. section 360bbb-3(b)(1), unless the authorization is terminated or revoked sooner.     Influenza A by PCR NEGATIVE NEGATIVE Final   Influenza B by PCR NEGATIVE NEGATIVE Final    Comment: (NOTE) The Xpert Xpress SARS-CoV-2/FLU/RSV assay is intended as an aid in  the diagnosis of influenza from Nasopharyngeal swab specimens and  should not be used as a sole basis for treatment. Nasal washings and  aspirates are unacceptable for Xpert Xpress SARS-CoV-2/FLU/RSV  testing.  Fact Sheet for Patients: PinkCheek.be  Fact Sheet for Healthcare Providers: GravelBags.it  This test is not yet approved or cleared by the [place] and  has been authorized for detection and/or diagnosis of SARS-CoV-2 by  FDA under an Emergency Use Authorization (EUA). This EUA will remain  in effect (meaning this test can be used) for the duration of the  Covid-19 declaration under Section 564(b)(1) of the Act, 21  U.S.C. section 360bbb-3(b)(1), unless the authorization is  terminated or revoked. Performed at [facility], [address] [place], [place] [address]   MRSA PCR Screening     Status: None   Collection Time: 06/29/20  9:12 AM   Specimen: Nasal Mucosa; Nasopharyngeal  Result Value Ref Range Status   MRSA by PCR NEGATIVE NEGATIVE Final    Comment:        The GeneXpert MRSA Assay (FDA approved for NASAL specimens only), is one component of  a comprehensive MRSA colonization surveillance program. It is not intended to diagnose MRSA infection nor to guide or monitor treatment for MRSA infections. Performed at [facility], [address] [place], [place] [address]          Radiology Studies: No results found.      Scheduled Meds: . bumetanide (BUMEX) IV  2 mg Intravenous Q12H  . Chlorhexidine Gluconate Cloth  6 each Topical Daily  . gabapentin  900 mg Oral QHS  . insulin aspart  0-20 Units Subcutaneous TID WC  . insulin aspart  0-5 Units Subcutaneous QHS  . insulin glargine  30 Units Subcutaneous QHS  . metoprolol tartrate  5 mg Intravenous Q6H  . pantoprazole  40 mg Oral Daily  . sertraline  50 mg Oral QHS  . tamsulosin  0.4 mg Oral Daily  . Warfarin - Pharmacist Dosing Inpatient   Does not apply q1600   Continuous Infusions: . amiodarone 30 mg/hr (07/01/20 0516)     LOS: 6 days    Time spent: 35 minutes    Kouper Spinella D Manuella Ghazi, DO [facility]  If 7PM-7AM, please contact night-coverage [email or web address] 07/01/2020, 9:06 AM

## 2020-07-01 NOTE — Progress Notes (Signed)
Patient was given 10 units of IV insulin per Dr. Trena Platt order. CBG at 16:15 is now 122. Dr. Manuella Ghazi has been notified and I will move forward with her scheduled regimen and continue to monitor.

## 2020-07-02 ENCOUNTER — Inpatient Hospital Stay (HOSPITAL_COMMUNITY): Payer: Medicare Other

## 2020-07-02 DIAGNOSIS — I25700 Atherosclerosis of coronary artery bypass graft(s), unspecified, with unstable angina pectoris: Secondary | ICD-10-CM | POA: Diagnosis not present

## 2020-07-02 DIAGNOSIS — R0600 Dyspnea, unspecified: Secondary | ICD-10-CM

## 2020-07-02 DIAGNOSIS — J9621 Acute and chronic respiratory failure with hypoxia: Secondary | ICD-10-CM

## 2020-07-02 DIAGNOSIS — E1165 Type 2 diabetes mellitus with hyperglycemia: Secondary | ICD-10-CM

## 2020-07-02 DIAGNOSIS — I4892 Unspecified atrial flutter: Secondary | ICD-10-CM | POA: Diagnosis not present

## 2020-07-02 DIAGNOSIS — E1140 Type 2 diabetes mellitus with diabetic neuropathy, unspecified: Secondary | ICD-10-CM

## 2020-07-02 DIAGNOSIS — Z930 Tracheostomy status: Secondary | ICD-10-CM

## 2020-07-02 DIAGNOSIS — I361 Nonrheumatic tricuspid (valve) insufficiency: Secondary | ICD-10-CM

## 2020-07-02 DIAGNOSIS — D6862 Lupus anticoagulant syndrome: Secondary | ICD-10-CM

## 2020-07-02 DIAGNOSIS — G4733 Obstructive sleep apnea (adult) (pediatric): Secondary | ICD-10-CM

## 2020-07-02 DIAGNOSIS — Z794 Long term (current) use of insulin: Secondary | ICD-10-CM

## 2020-07-02 DIAGNOSIS — R0689 Other abnormalities of breathing: Secondary | ICD-10-CM

## 2020-07-02 DIAGNOSIS — I10 Essential (primary) hypertension: Secondary | ICD-10-CM

## 2020-07-02 DIAGNOSIS — I5043 Acute on chronic combined systolic (congestive) and diastolic (congestive) heart failure: Secondary | ICD-10-CM | POA: Diagnosis not present

## 2020-07-02 LAB — ECHOCARDIOGRAM COMPLETE
AR max vel: 1.62 cm2
AV Area VTI: 1.51 cm2
AV Area mean vel: 1.51 cm2
AV Mean grad: 5.6 mmHg
AV Peak grad: 13.8 mmHg
Ao pk vel: 1.86 m/s
Area-P 1/2: 4.71 cm2
Height: 58 in
S' Lateral: 2.6 cm
Weight: 3421.54 oz

## 2020-07-02 LAB — CBC
HCT: 40.7 % (ref 36.0–46.0)
Hemoglobin: 12.4 g/dL (ref 12.0–15.0)
MCH: 30.3 pg (ref 26.0–34.0)
MCHC: 30.5 g/dL (ref 30.0–36.0)
MCV: 99.5 fL (ref 80.0–100.0)
Platelets: 220 10*3/uL (ref 150–400)
RBC: 4.09 MIL/uL (ref 3.87–5.11)
RDW: 15.5 % (ref 11.5–15.5)
WBC: 7.1 10*3/uL (ref 4.0–10.5)
nRBC: 0 % (ref 0.0–0.2)

## 2020-07-02 LAB — BASIC METABOLIC PANEL
Anion gap: 10 (ref 5–15)
BUN: 29 mg/dL — ABNORMAL HIGH (ref 6–20)
CO2: 38 mmol/L — ABNORMAL HIGH (ref 22–32)
Calcium: 8.3 mg/dL — ABNORMAL LOW (ref 8.9–10.3)
Chloride: 90 mmol/L — ABNORMAL LOW (ref 98–111)
Creatinine, Ser: 0.94 mg/dL (ref 0.44–1.00)
GFR, Estimated: >60 mL/min (ref >60)
Glucose, Bld: 103 mg/dL — ABNORMAL HIGH (ref 70–99)
Potassium: 3.9 mmol/L (ref 3.5–5.1)
Sodium: 138 mmol/L (ref 135–145)

## 2020-07-02 LAB — GLUCOSE, CAPILLARY
Glucose-Capillary: 108 mg/dL — ABNORMAL HIGH (ref 70–99)
Glucose-Capillary: 236 mg/dL — ABNORMAL HIGH (ref 70–99)
Glucose-Capillary: 195 mg/dL — ABNORMAL HIGH (ref 70–99)
Glucose-Capillary: 68 mg/dL — ABNORMAL LOW (ref 70–99)
Glucose-Capillary: 95 mg/dL (ref 70–99)

## 2020-07-02 LAB — PROTIME-INR
INR: 2.4 — ABNORMAL HIGH (ref 0.8–1.2)
Prothrombin Time: 25.5 seconds — ABNORMAL HIGH (ref 11.4–15.2)

## 2020-07-02 LAB — MAGNESIUM: Magnesium: 1.9 mg/dL (ref 1.7–2.4)

## 2020-07-02 MED ORDER — WARFARIN SODIUM 2.5 MG PO TABS
2.5000 mg | ORAL_TABLET | Freq: Once | ORAL | Status: AC
  Administered 2020-07-02: 2.5 mg via ORAL
  Filled 2020-07-02: qty 1

## 2020-07-02 MED ORDER — METOPROLOL TARTRATE 25 MG PO TABS: 25.0000 mg | ORAL_TABLET | Freq: Three times a day (TID) | ORAL | Status: DC

## 2020-07-02 MED ORDER — INSULIN ASPART 100 UNIT/ML ~~LOC~~ SOLN
5.0000 [IU] | Freq: Three times a day (TID) | SUBCUTANEOUS | Status: DC
  Administered 2020-07-02: 5 [IU] via SUBCUTANEOUS

## 2020-07-02 MED ORDER — METOPROLOL TARTRATE 25 MG PO TABS
25.0000 mg | ORAL_TABLET | Freq: Three times a day (TID) | ORAL | Status: DC
  Administered 2020-07-02 – 2020-07-06 (×10): 25 mg via ORAL
  Filled 2020-07-02 (×16): qty 1

## 2020-07-02 NOTE — Progress Notes (Signed)
*  PRELIMINARY RESULTS* Echocardiogram 2D Echocardiogram has been performed.  Leavy Cella 07/02/2020, 12:11 PM

## 2020-07-02 NOTE — Progress Notes (Addendum)
PROGRESS NOTE    Theresa [name]  VZC:[id number] DOB: Jan 31, 1966 DOA: 06/25/2020 PCP: Monico Blitz, MD   Brief Narrative:  Per HPI: Theresa [name] a 54 y.o.femalewith medical history significant forchronic respiratory failure on 4 L, systolic and diastolic CHF, coronary artery disease, DVT, diabetes mellitus, hypertension, psoriasis. Patient was seen today by advanced home care nurse.It was found that patient had gained 14 pounds since she was recently discharged from the hospital, patient was also having increasing difficulty breathing, with bilateral lower extremity swelling and bloating. Patient was referred to the ED. Patient reports compliance with her torsemide40mg  BID,and denies dietary indiscretion. Patient has a trach. She reports productive cough, wheezing since yesterday. No chest pain.  Recent hospitalization 10/9-10/15,for severe sepsis due to E. coli bacteremia,cholecystitis,lactic acid peaked at 2.8, she was treated with ceftriaxone and metronidazole, and transitioned over to Augmentin on discharge. General surgery recommended nonoperative management cholecystitis, as patient is not a good surgical candidate. She was also managed for acute on chronic hypoxic respiratory failure, and decompensated CHF,COPD exacerbation,requiring 10 L of oxygen,she was discharged on her baseline 4 L.  -Patient admitted with acute on chronic combined diastolic and systolic heart failure with associated acute on chronic hypoxemic respiratory failure.  She was having difficulty with diuresis on IV Lasix and doses were being increased up to IV 80 mg 3 times daily.  Unfortunately on 10/24 she developed new onset atrial flutter with RVR and was transferred to stepdown unit and started on amiodarone drip on account of soft blood pressure readings.  She continued to have elevated heart rate readings for which repeat bolus and digoxin were given on 10/25 with improvement in blood pressure  readings.  She is now starting to diurese better with IV Bumex and maintains controlled heart rates.  Assessment & Plan:   Principal Problem:   Acute on chronic combined systolic and diastolic HF (heart failure) (HCC) Active Problems:   DM (diabetes mellitus) ([place])   Essential hypertension   OSA (obstructive sleep apnea)   Lupus anticoagulant syndrome (HCC)   CAD (coronary artery disease) of artery bypass graft: PTCA/DES to distal body VG to PDA 10/29/15   Tracheostomy in place [facility]), chronic since 2002   Acute on chronic respiratory failure with hypoxia ([place])   Uncontrolled type 2 diabetes mellitus with hyperglycemia, with long-term current use of insulin (HCC)   Acute on chronic respiratory failure with hypoxia (HCC)   Chronic respiratory insufficiency   DVT (deep venous thrombosis) (HCC)   Atrial flutter with rapid ventricular response (HCC)   Acute on chronic hypoxemic respiratory failure secondary to acute on chronic combined diastolic and systolic heart failure-ongoing -down to 8 L oxygen via trach collar.  She wears 4 L total at baseline. -She reports compliance with 40 mg torsemide twice daily at home -Continue diuresis with Bumex as ordered 2 mg IV twice daily with -3200 mL output total -Follow daily BMP, daily weights, strict I's and O's -Appreciate cardiology evaluation -Holding home losartan and metoprolol with holding parameters due to softening blood pressure readings.  New onset atrial flutter with RVR -With soft blood pressure readings, plan to continue amiodarone drip -Appreciate cardiology recommendations for further management -TSH 4.4 -Patient already anticoagulated with warfarin with INR 2.4  Elevated troponin secondary to above in setting of CAD -Likely some mild demand ischemia with flat trend noted and no chest pain  COPD -No acute bronchospasms currently noted -Continue mucolytic's -Duo nebs to Xopenex as needed for shortness of breath or  wheezing -Flutter valve  Lupus anticoagulant syndrome/history of DVT on chronic anticoagulation -Continue warfarin with pharmacy to dose, PT/INR  History of CABG -No chest pain currently noted -Continue warfarin  Uncontrolled type 2 diabetes likely related to steroid-induced hyperglycemia, improving -Continue SSI on aggressive scale while holding Ozempic -Continue home Tresibaatslightly increased dose of 30 units tonight from 25 units  Depression -Continue home sertraline and lorazepam   DVT prophylaxis:Warfarin Code Status:Full code Family Communication:sister phone 10/26 Disposition Plan: Status is: Inpatient  Remains inpatient appropriate because:IV treatments appropriate due to intensity of illness or inability to take PO and Inpatient level of care appropriate due to severity of illness  Dispo: The patient is from:Home Anticipated d/c is OQ:HUTM Anticipated d/c date is: 2days Patient currently is not medically stable to d/c.She requires ongoing diuresis and inpatient managementfor heart rate control.   Consultants:  Cardiology  Procedures:  See below  Antimicrobials:   None  Subjective: Patient reports that she is feeling better today.  No CP, no palpitations, SOB improving some  Objective: Vitals:   07/02/20 0600 07/02/20 0630 07/02/20 0801 07/02/20 0825  BP: 110/78 103/62    Pulse: (!) 52 (!) 50 67 68  Resp: 15 16 17 17   Temp:   (!) 97.4 F (36.3 C)   TempSrc:   Oral   SpO2: 95% 93% 96% 96%  Weight:      Height:        Intake/Output Summary (Last 24 hours) at 07/02/2020 0843 Last data filed at 07/02/2020 0500 Gross per 24 hour  Intake --  Output 500 ml  Net -500 ml   Filed Weights   06/29/20 0500 06/29/20 0858 06/30/20 0500  Weight: 75.2 kg 97.2 kg 97 kg    Examination:  General exam: chronically ill appearing, awake, alert, NAD.  Respiratory system: bibasilar crackles.  No increased work of breathing.  Tracheostomy with trach collar at 10 L in place as well as nasal cannula 3 L. Cardiovascular system: normal S1 & S2 heard, irregular, but rate controlled Gastrointestinal system: Abdomen is nondistended, soft and nontender.  Central nervous system: Alert and awake Extremities: bilateral lower extremity edema Skin: No rashes, lesions or ulcers Psychiatry: Judgement and insight appear normal. Mood & affect appropriate.   Data Reviewed: I have personally reviewed following labs and imaging studies  CBC: Recent Labs  Lab 06/25/20 1633 06/27/20 0346 06/28/20 0559 06/29/20 0529 06/30/20 0354 07/01/20 0319 07/02/20 0505  WBC 10.3   < > 10.0 7.5 6.7 6.9 7.1  NEUTROABS 9.0*  --   --   --   --   --   --   HGB 12.5   < > 13.1 13.4 12.1 11.9* 12.4  HCT 39.2   < > 43.0 43.1 38.4 37.4 40.7  MCV 96.3   < > 99.5 98.2 98.5 97.1 99.5  PLT 260   < > 201 192 191 193 220   < > = values in this interval not displayed.   Basic Metabolic Panel: Recent Labs  Lab 06/28/20 0559 06/29/20 0529 06/30/20 0354 07/01/20 0319 07/02/20 0505  NA 138 133* 135 137 138  K 3.8 3.7 4.1 3.5 3.9  CL 90* 87* 90* 90* 90*  CO2 36* 34* 35* 37* 38*  GLUCOSE 74 218* 123* 233* 103*  BUN 19 23* 33* 30* 29*  CREATININE 0.77 0.97 0.95 0.81 0.94  CALCIUM 8.9 8.9 8.8* 8.4* 8.3*  MG 2.0 1.9 2.2 1.9 1.9   GFR: Estimated Creatinine Clearance: 68.4 mL/min (by  C-G formula based on SCr of 0.94 mg/dL). Liver Function Tests: Recent Labs  Lab 06/25/20 1633  AST 17  ALT 29  ALKPHOS 173*  BILITOT 0.9  PROT 6.3*  ALBUMIN 2.9*   No results for input(s): LIPASE, AMYLASE in the last 168 hours. No results for input(s): AMMONIA in the last 168 hours. Coagulation Profile: Recent Labs  Lab 06/28/20 0559 06/29/20 0529 06/30/20 0354 07/01/20 0319 07/02/20 0505  INR 1.9* 2.3* 2.4* 2.4* 2.4*   Cardiac Enzymes: No results for input(s): CKTOTAL, CKMB, CKMBINDEX, TROPONINI in the last 168  hours. BNP (last 3 results) No results for input(s): PROBNP in the last 8760 hours. HbA1C: No results for input(s): HGBA1C in the last 72 hours. CBG: Recent Labs  Lab 07/01/20 1134 07/01/20 1311 07/01/20 1618 07/01/20 2124 07/02/20 0800  GLUCAP 425* 438* 122* 144* 108*   Lipid Profile: No results for input(s): CHOL, HDL, LDLCALC, TRIG, CHOLHDL, LDLDIRECT in the last 72 hours. Thyroid Function Tests: Recent Labs    06/29/20 0910  TSH 4.443   Anemia Panel: No results for input(s): VITAMINB12, FOLATE, FERRITIN, TIBC, IRON, RETICCTPCT in the last 72 hours. Sepsis Labs: Recent Labs  Lab 06/27/20 1424  PROCALCITON <0.10    Recent Results (from the past 240 hour(s))  Respiratory Panel by RT PCR (Flu A&B, Covid) - Nasopharyngeal Swab     Status: None   Collection Time: 06/25/20  4:07 PM   Specimen: Nasopharyngeal Swab  Result Value Ref Range Status   SARS Coronavirus 2 by RT PCR NEGATIVE NEGATIVE Final    Comment: (NOTE) SARS-CoV-2 target nucleic acids are NOT DETECTED.  The SARS-CoV-2 RNA is generally detectable in upper respiratoy specimens during the acute phase of infection. The lowest concentration of SARS-CoV-2 viral copies this assay can detect is 131 copies/mL. A negative result does not preclude SARS-Cov-2 infection and should not be used as the sole basis for treatment or other patient management decisions. A negative result may occur with  improper specimen collection/handling, submission of specimen other than nasopharyngeal swab, presence of viral mutation(s) within the areas targeted by this assay, and inadequate number of viral copies (<131 copies/mL). A negative result must be combined with clinical observations, patient history, and epidemiological information. The expected result is Negative.  Fact Sheet for Patients:  PinkCheek.be  Fact Sheet for Healthcare Providers:  GravelBags.it  This  test is no t yet approved or cleared by the [place] FDA and  has been authorized for detection and/or diagnosis of SARS-CoV-2 by FDA under an Emergency Use Authorization (EUA). This EUA will remain  in effect (meaning this test can be used) for the duration of the COVID-19 declaration under Section 564(b)(1) of the Act, 21 U.S.C. section 360bbb-3(b)(1), unless the authorization is terminated or revoked sooner.     Influenza A by PCR NEGATIVE NEGATIVE Final   Influenza B by PCR NEGATIVE NEGATIVE Final    Comment: (NOTE) The Xpert Xpress SARS-CoV-2/FLU/RSV assay is intended as an aid in  the diagnosis of influenza from Nasopharyngeal swab specimens and  should not be used as a sole basis for treatment. Nasal washings and  aspirates are unacceptable for Xpert Xpress SARS-CoV-2/FLU/RSV  testing.  Fact Sheet for Patients: PinkCheek.be  Fact Sheet for Healthcare Providers: GravelBags.it  This test is not yet approved or cleared by the [place] FDA and  has been authorized for detection and/or diagnosis of SARS-CoV-2 by  FDA under an Emergency Use Authorization (EUA). This EUA will remain  in effect (  meaning this test can be used) for the duration of the  Covid-19 declaration under Section 564(b)(1) of the Act, 21  U.S.C. section 360bbb-3(b)(1), unless the authorization is  terminated or revoked. Performed at [facility], [address] [place], [place] [address]   MRSA PCR Screening     Status: None   Collection Time: 06/29/20  9:12 AM   Specimen: Nasal Mucosa; Nasopharyngeal  Result Value Ref Range Status   MRSA by PCR NEGATIVE NEGATIVE Final    Comment:        The GeneXpert MRSA Assay (FDA approved for NASAL specimens only), is one component of a comprehensive MRSA colonization surveillance program. It is not intended to diagnose MRSA infection nor to guide or monitor treatment for MRSA  infections. Performed at [facility], [address] [place], [place] [address]      Radiology Studies: No results found.  Scheduled Meds: . bumetanide (BUMEX) IV  2 mg Intravenous Q12H  . Chlorhexidine Gluconate Cloth  6 each Topical Daily  . digoxin  0.25 mg Oral Daily  . gabapentin  900 mg Oral QHS  . insulin aspart  0-20 Units Subcutaneous TID WC  . insulin aspart  0-5 Units Subcutaneous QHS  . insulin aspart  3 Units Subcutaneous TID WC  . insulin glargine  30 Units Subcutaneous QHS  . metoprolol tartrate  5 mg Intravenous Q6H  . pantoprazole  40 mg Oral Daily  . sertraline  50 mg Oral QHS  . tamsulosin  0.4 mg Oral Daily  . warfarin  2.5 mg Oral ONCE-1600  . Warfarin - Pharmacist Dosing Inpatient   Does not apply q1600   Continuous Infusions: . amiodarone 30 mg/hr (07/02/20 0618)     LOS: 7 days   Critical Care Procedure Note Authorized and Performed by: Murvin Natal MD  Total Critical Care time:  33 minutes Due to a high probability of clinically significant, life threatening deterioration, the patient required my highest level of preparedness to intervene emergently and I personally spent this critical care time directly and personally managing the patient.  This critical care time included obtaining a history; examining the patient, pulse oximetry; ordering and review of studies; arranging urgent treatment with development of a management plan; evaluation of patient's response of treatment; frequent reassessment; and discussions with other providers.  This critical care time was performed to assess and manage the high probability of imminent and life threatening deterioration that could result in multi-organ failure.  It was exclusive of separately billable procedures and treating other patients and teaching time.    Irwin Brakeman, MD [facility]  If 7PM-7AM, please contact night-coverage [email or web address] 07/02/2020, 8:43 AM

## 2020-07-02 NOTE — Progress Notes (Signed)
ANTICOAGULATION CONSULT NOTE -   Pharmacy Consult for warfarin Indication: lupus anticoagulant and hx VTE  Patient Measurements: Height: 4\' 10"  (147.3 cm) Weight: 97 kg (213 lb 13.5 oz) IBW/kg (Calculated) : 40.9  Vital Signs: Temp: 97.4 F (36.3 C) (10/27 0801) Temp Source: Oral (10/27 0801) BP: 103/62 (10/27 0630) Pulse Rate: 68 (10/27 0825)  Labs: Recent Labs    06/29/20 0910 06/29/20 1113 06/30/20 0354 06/30/20 0354 07/01/20 0319 07/02/20 0505  HGB  --   --  12.1   < > 11.9* 12.4  HCT  --   --  38.4  --  37.4 40.7  PLT  --   --  191  --  193 220  LABPROT  --   --  25.0*  --  25.2* 25.5*  INR  --   --  2.4*  --  2.4* 2.4*  CREATININE  --   --  0.95  --  0.81 0.94  TROPONINIHS 24* 31*  --   --   --   --    < > = values in this interval not displayed.    Estimated Creatinine Clearance: 68.4 mL/min (by C-G formula based on SCr of 0.94 mg/dL).  Assessment: 62 yof who presents to the ED with increasing SOB and bilateral leg swelling. She was recently discharged from the hospital on 10/15. She takes warfarin PTA for lupus anticoagulant and hx VTE. Pharmacy consulted to dose warfarin while inpatient. With addition of amiodarone on 10/24 will likely interact with coumadin and dose amount will be less.   Home dose:  2.5 mg daily except 5 mg MWF 10/22: On 10/20 Coumadin 2.5mg  dose not given. So on 10/21 7.5mg  given.  INR is therapeutic at 2.4 today  Goal of Therapy:  INR 2-3 Monitor platelets by anticoagulation protocol: Yes   Plan:  Give warfarin 2.5 mg po x1 dose today Daily INR and every other day CBC Monitor for signs and symptoms  of bleeding  Thank you for involving pharmacy in this patient's care.  Margot Ables, PharmD Clinical Pharmacist 07/02/2020 8:28 AM

## 2020-07-02 NOTE — Progress Notes (Signed)
Inpatient Diabetes Program Recommendations  AACE/ADA: New Consensus Statement on Inpatient Glycemic Control (2015)  Target Ranges:  Prepandial:   less than 140 mg/dL      Peak postprandial:   less than 180 mg/dL (1-2 hours)      Critically ill patients:  140 - 180 mg/dL   Lab Results  Component Value Date   GLUCAP 236 (H) 07/02/2020   HGBA1C 8.9 (H) 06/16/2020    Review of Glycemic Control Results for Theresa [name], Theresa [name] (MRN [id number]) as of 07/02/2020 12:50  Ref. Range 07/01/2020 08:05 07/01/2020 11:34 07/01/2020 13:11 07/01/2020 16:18 07/01/2020 21:24 07/02/2020 08:00 07/02/2020 11:52  Glucose-Capillary Latest Ref Range: 70 - 99 mg/dL 131 (H) 425 (H) 438 (H) 122 (H) 144 (H) 108 (H)  Novolog 3 units given 236 (H)   Diabetes history: DM 2 Outpatient Diabetes medications: Tresiba 50 units QHs, Novolog 1-10 units TID, Ozempic 0.5 mg Qwed Current orders for Inpatient glycemic control:  Lantus 30 units qhs Novolog 0-20 units tid + hs Novolog 3 units tid meal coverage  Inpatient Diabetes Program Recommendations:    - may benefit from increasing Novolog meal coverage to Novolog 5-6 units tid if eating >50% of meals.  Thanks,  Tama Headings RN, MSN, BC-ADM Inpatient Diabetes Coordinator Team Pager [phone] (8a-5p)

## 2020-07-02 NOTE — Progress Notes (Signed)
Progress Note  Patient Name: Theresa [name] Date of Encounter: 07/02/2020  [facility] Cardiologist: [name], MD   Subjective   SOme improvement in SOB  Inpatient Medications    Scheduled Meds: . bumetanide (BUMEX) IV  2 mg Intravenous Q12H  . Chlorhexidine Gluconate Cloth  6 each Topical Daily  . digoxin  0.25 mg Oral Daily  . gabapentin  900 mg Oral QHS  . insulin aspart  0-20 Units Subcutaneous TID WC  . insulin aspart  0-5 Units Subcutaneous QHS  . insulin aspart  3 Units Subcutaneous TID WC  . insulin glargine  30 Units Subcutaneous QHS  . metoprolol tartrate  5 mg Intravenous Q6H  . pantoprazole  40 mg Oral Daily  . sertraline  50 mg Oral QHS  . tamsulosin  0.4 mg Oral Daily  . warfarin  2.5 mg Oral ONCE-1600  . Warfarin - Pharmacist Dosing Inpatient   Does not apply q1600   Continuous Infusions: . amiodarone 30 mg/hr (07/02/20 0618)   PRN Meds: acetaminophen **OR** acetaminophen, guaiFENesin-dextromethorphan, levalbuterol, LORazepam, ondansetron **OR** ondansetron (ZOFRAN) IV, polyethylene glycol, sodium chloride flush   Vital Signs    Vitals:   07/02/20 0600 07/02/20 0630 07/02/20 0801 07/02/20 0825  BP: 110/78 103/62    Pulse: (!) 52 (!) 50 67 68  Resp: 15 16 17 17   Temp:   (!) 97.4 F (36.3 C)   TempSrc:   Oral   SpO2: 95% 93% 96% 96%  Weight:      Height:        Intake/Output Summary (Last 24 hours) at 07/02/2020 0929 Last data filed at 07/02/2020 0500 Gross per 24 hour  Intake --  Output 500 ml  Net -500 ml   Last 3 Weights 06/30/2020 06/29/2020 06/29/2020  Weight (lbs) 213 lb 13.5 oz 214 lb 4.6 oz 165 lb 12.6 oz  Weight (kg) 97 kg 97.2 kg 75.2 kg      Telemetry    afib 80s to 100s - Personally Reviewed  ECG    n/a- Personally Reviewed  Physical Exam   GEN: No acute distress.   Neck: No JVD Cardiac: irreg, no murmurs, rubs, or gallops.  Respiratory: Coarse bilaterally GI: Soft, nontender, non-distended  MS: trace  bilateral LE edema; No deformity. Neuro:  Nonfocal  Psych: Normal affect   Labs    High Sensitivity Troponin:   Recent Labs  Lab 06/29/20 0910 06/29/20 1113  TROPONINIHS 24* 31*      Chemistry Recent Labs  Lab 06/25/20 1633 06/26/20 0337 06/30/20 0354 07/01/20 0319 07/02/20 0505  NA 134*   < > 135 137 138  K 3.5   < > 4.1 3.5 3.9  CL 91*   < > 90* 90* 90*  CO2 33*   < > 35* 37* 38*  GLUCOSE 473*   < > 123* 233* 103*  BUN 17   < > 33* 30* 29*  CREATININE 0.78   < > 0.95 0.81 0.94  CALCIUM 7.9*   < > 8.8* 8.4* 8.3*  PROT 6.3*  --   --   --   --   ALBUMIN 2.9*  --   --   --   --   AST 17  --   --   --   --   ALT 29  --   --   --   --   ALKPHOS 173*  --   --   --   --   BILITOT 0.9  --   --   --   --  GFRNONAA >60   < > >60 >60 >60  ANIONGAP 10   < > 10 10 10    < > = values in this interval not displayed.     Hematology Recent Labs  Lab 06/30/20 0354 07/01/20 0319 07/02/20 0505  WBC 6.7 6.9 7.1  RBC 3.90 3.85* 4.09  HGB 12.1 11.9* 12.4  HCT 38.4 37.4 40.7  MCV 98.5 97.1 99.5  MCH 31.0 30.9 30.3  MCHC 31.5 31.8 30.5  RDW 16.4* 15.9* 15.5  PLT 191 193 220    BNP Recent Labs  Lab 06/25/20 1633  BNP 536.0*     DDimer No results for input(s): DDIMER in the last 168 hours.   Radiology    No results found.  Cardiac Studies     Patient Profile     54 y.o. female with a hx of chronic combined systyolic/diastolic HF, CAD with prior CABG, chronic respiratory failure with chronic trach,who is being seen today for the evaluation of SOB and leg edemaat the request of Dr Manuella Ghazi  Assessment & Plan    1. Acute on chronic combined systolic/diastolic HF - 01/4007 echo LVEF 40-45%, grade III DDx, normal RV function - admitted with 14 lbs weight gainfrom home report (from [facility] weights 5 lbs since discharge last week), LE edema. BNP in the 500s, pulm edema on cxr  I/Os are incomplete. She is on IV bumex 2mg  bid. Mild variations in Cr without clear  trend. Weights appear inaccurate. Symptomatically she has been improving.  - recheck BNP and CXR tomorrow - recheck echo now that HRs improved    2.Aflutter with RVR - new diagnosis this admission - soft bp's, she was started on amiodarone over the weekend - has already been on coumadin for history of prior DVT/Lupus antiocoagulant  06/25/20 INR was 1.8, 10/13 INR 1.6. she would require TEE if cardioverted. - on 10 L trach collar, respiratory status not stable for TEE/DCCV at this time.   - rebolus amio load yest and IV digoxin 0.5mg  x 1 then 0.25mg  IV every 6 hours for additional 2 doses (total of 1 mg) given. HR in 90's sometime up to 115, but mostly 90's  - has been on amio gtt, loaded with IV digoxin now on oral 0.25mg  daily, IV lopressor 5mg  every 6 hours. - soft bp's at times but improving, change her IV lopressor to 25mg  tid oral lopressor.  - ideally TEE/DCCV when respiratory status has improved if remains in aflutter    3. Chronic respiraotry failure - has chronic trach, baseline 4L O2 at home  4. CAD - mild trop in setting of RVR, hypoxia - no plans for ischemic testing at this time     For questions or updates, please contact [place] Please consult [email or web address] for contact info under        Signed, Carlyle Dolly, MD  07/02/2020, 9:29 AM

## 2020-07-02 NOTE — Progress Notes (Signed)
Patient given snack for CBG 68, will recheck and provide lantus if appropriate.

## 2020-07-03 ENCOUNTER — Inpatient Hospital Stay (HOSPITAL_COMMUNITY): Payer: Medicare Other

## 2020-07-03 DIAGNOSIS — I4892 Unspecified atrial flutter: Secondary | ICD-10-CM | POA: Diagnosis not present

## 2020-07-03 DIAGNOSIS — J9621 Acute and chronic respiratory failure with hypoxia: Secondary | ICD-10-CM | POA: Diagnosis not present

## 2020-07-03 DIAGNOSIS — R0902 Hypoxemia: Secondary | ICD-10-CM

## 2020-07-03 DIAGNOSIS — I4891 Unspecified atrial fibrillation: Secondary | ICD-10-CM | POA: Diagnosis not present

## 2020-07-03 DIAGNOSIS — E876 Hypokalemia: Secondary | ICD-10-CM

## 2020-07-03 DIAGNOSIS — I25700 Atherosclerosis of coronary artery bypass graft(s), unspecified, with unstable angina pectoris: Secondary | ICD-10-CM | POA: Diagnosis not present

## 2020-07-03 DIAGNOSIS — I5043 Acute on chronic combined systolic (congestive) and diastolic (congestive) heart failure: Secondary | ICD-10-CM | POA: Diagnosis not present

## 2020-07-03 LAB — BASIC METABOLIC PANEL
Anion gap: 10 (ref 5–15)
BUN: 32 mg/dL — ABNORMAL HIGH (ref 6–20)
CO2: 37 mmol/L — ABNORMAL HIGH (ref 22–32)
Calcium: 9 mg/dL (ref 8.9–10.3)
Chloride: 90 mmol/L — ABNORMAL LOW (ref 98–111)
Creatinine, Ser: 0.98 mg/dL (ref 0.44–1.00)
GFR, Estimated: >60 mL/min (ref >60)
Glucose, Bld: 128 mg/dL — ABNORMAL HIGH (ref 70–99)
Potassium: 3.3 mmol/L — ABNORMAL LOW (ref 3.5–5.1)
Sodium: 137 mmol/L (ref 135–145)

## 2020-07-03 LAB — MAGNESIUM: Magnesium: 1.9 mg/dL (ref 1.7–2.4)

## 2020-07-03 LAB — GLUCOSE, CAPILLARY
Glucose-Capillary: 130 mg/dL — ABNORMAL HIGH (ref 70–99)
Glucose-Capillary: 350 mg/dL — ABNORMAL HIGH (ref 70–99)
Glucose-Capillary: 91 mg/dL (ref 70–99)
Glucose-Capillary: 88 mg/dL (ref 70–99)

## 2020-07-03 LAB — PROTIME-INR
INR: 2.2 — ABNORMAL HIGH (ref 0.8–1.2)
Prothrombin Time: 23.8 seconds — ABNORMAL HIGH (ref 11.4–15.2)

## 2020-07-03 LAB — BRAIN NATRIURETIC PEPTIDE: B Natriuretic Peptide: 480 pg/mL — ABNORMAL HIGH (ref 0.0–100.0)

## 2020-07-03 MED ORDER — MAGNESIUM SULFATE IN D5W 1-5 GM/100ML-% IV SOLN
1.0000 g | Freq: Once | INTRAVENOUS | Status: AC
  Administered 2020-07-03: 1 g via INTRAVENOUS
  Filled 2020-07-03: qty 100

## 2020-07-03 MED ORDER — WARFARIN SODIUM 5 MG PO TABS
5.0000 mg | ORAL_TABLET | Freq: Once | ORAL | Status: AC
  Administered 2020-07-03: 5 mg via ORAL
  Filled 2020-07-03: qty 1

## 2020-07-03 MED ORDER — POTASSIUM CHLORIDE CRYS ER 20 MEQ PO TBCR
60.0000 meq | EXTENDED_RELEASE_TABLET | Freq: Every day | ORAL | Status: DC
  Administered 2020-07-03 – 2020-07-07 (×5): 60 meq via ORAL
  Filled 2020-07-03 (×5): qty 3

## 2020-07-03 MED ORDER — INSULIN GLARGINE 100 UNIT/ML ~~LOC~~ SOLN
25.0000 [IU] | Freq: Every day | SUBCUTANEOUS | Status: DC
  Administered 2020-07-03 – 2020-07-06 (×4): 25 [IU] via SUBCUTANEOUS
  Filled 2020-07-03 (×5): qty 0.25

## 2020-07-03 MED ORDER — INSULIN ASPART 100 UNIT/ML ~~LOC~~ SOLN
4.0000 [IU] | Freq: Three times a day (TID) | SUBCUTANEOUS | Status: DC
  Administered 2020-07-03 – 2020-07-05 (×8): 4 [IU] via SUBCUTANEOUS

## 2020-07-03 NOTE — Progress Notes (Signed)
Progress Note  Patient Name: Theresa [name] Date of Encounter: 07/03/2020  Primary Cardiologist: [name], MD  Subjective   Stable shortness of breath, no chest pain or palpitations.  Inpatient Medications    Scheduled Meds: . bumetanide (BUMEX) IV  2 mg Intravenous Q12H  . Chlorhexidine Gluconate Cloth  6 each Topical Daily  . digoxin  0.25 mg Oral Daily  . gabapentin  900 mg Oral QHS  . insulin aspart  0-20 Units Subcutaneous TID WC  . insulin aspart  0-5 Units Subcutaneous QHS  . insulin aspart  4 Units Subcutaneous TID WC  . insulin glargine  25 Units Subcutaneous QHS  . metoprolol tartrate  25 mg Oral TID  . pantoprazole  40 mg Oral Daily  . potassium chloride  60 mEq Oral Daily  . sertraline  50 mg Oral QHS  . tamsulosin  0.4 mg Oral Daily  . Warfarin - Pharmacist Dosing Inpatient   Does not apply q1600   Continuous Infusions: . amiodarone 30 mg/hr (07/03/20 0534)  . magnesium sulfate bolus IVPB     PRN Meds: acetaminophen **OR** acetaminophen, guaiFENesin-dextromethorphan, levalbuterol, LORazepam, ondansetron **OR** ondansetron (ZOFRAN) IV, polyethylene glycol, sodium chloride flush   Vital Signs    Vitals:   07/03/20 0500 07/03/20 0530 07/03/20 0755 07/03/20 0801  BP: (!) 102/49 (!) 97/56    Pulse: 66 60 63 80  Resp: 16 18 19  (!) 21  Temp:   98.2 F (36.8 C)   TempSrc:   Oral   SpO2: 96% 94% 93% 93%  Weight:      Height:        Intake/Output Summary (Last 24 hours) at 07/03/2020 0907 Last data filed at 07/03/2020 0534 Gross per 24 hour  Intake --  Output 1400 ml  Net -1400 ml   Filed Weights   06/29/20 0500 06/29/20 0858 06/30/20 0500  Weight: 75.2 kg 97.2 kg 97 kg    Telemetry    Atrial fibrillation/flutter.  Personally reviewed.  ECG    No ECG reviewed.  Physical Exam   GEN: No acute distress.   Neck: No JVD.  Tracheostomy with supplemental oxygen. Cardiac:  Irregularly irregular, no gallop.  Respiratory:  Decreased  breath sounds, no wheezing. GI: Soft, nontender, bowel sounds present. MS:  Bilateral lower extremity edema.  Labs    Chemistry Recent Labs  Lab 07/01/20 0319 07/02/20 0505 07/03/20 0409  NA 137 138 137  K 3.5 3.9 3.3*  CL 90* 90* 90*  CO2 37* 38* 37*  GLUCOSE 233* 103* 128*  BUN 30* 29* 32*  CREATININE 0.81 0.94 0.98  CALCIUM 8.4* 8.3* 9.0  GFRNONAA >60 >60 >60  ANIONGAP 10 10 10      Hematology Recent Labs  Lab 06/30/20 0354 07/01/20 0319 07/02/20 0505  WBC 6.7 6.9 7.1  RBC 3.90 3.85* 4.09  HGB 12.1 11.9* 12.4  HCT 38.4 37.4 40.7  MCV 98.5 97.1 99.5  MCH 31.0 30.9 30.3  MCHC 31.5 31.8 30.5  RDW 16.4* 15.9* 15.5  PLT 191 193 220    Cardiac Enzymes Recent Labs  Lab 06/29/20 0910 06/29/20 1113  TROPONINIHS 24* 31*    BNP Recent Labs  Lab 07/03/20 0409  BNP 480.0*     Radiology    DG Chest Port 1 View  Result Date: 07/03/2020 CLINICAL DATA:  Shortness of breath. EXAM: PORTABLE CHEST 1 VIEW COMPARISON:  06/29/2020 FINDINGS: 0452 hours. The cardio pericardial silhouette is enlarged. There is pulmonary vascular congestion without overt pulmonary  edema. Interstitial markings are diffusely coarsened with chronic features. Staple line projects over the right lung base with associated pleuroparenchymal scarring. Interval decrease in the basilar infiltrative opacity described previously. Bones are diffusely demineralized. Telemetry leads overlie the chest. IMPRESSION: Enlarged cardiopericardial silhouette with pulmonary vascular congestion. Interval improvement in basilar aeration. Electronically Signed   By: Misty Stanley M.D.   On: 07/03/2020 07:17   ECHOCARDIOGRAM COMPLETE  Result Date: 07/02/2020    ECHOCARDIOGRAM REPORT   Patient Name:   Theresa [name] Date of Exam: 07/02/2020 Medical Rec #:  [id number]       Height:       58.0 in Accession #:    [id number]      Weight:       213.8 lb Date of Birth:  10-26-1965       BSA:          1.874 m Patient Age:    54  years        BP:           106/59 mmHg Patient Gender: F               HR:           89 bpm. Exam Location:  [facility] Procedure: 2D Echo Indications:    Dyspnea 786.09 / R06.00  History:        Patient has prior history of Echocardiogram examinations, most                 recent 04/20/2020. CHF, CAD, Prior CABG, Arrythmias:Atrial                 Flutter, Signs/Symptoms:Bacteremia; Risk Factors:Diabetes,                 Hypertension and Current Smoker. PHTN.  Sonographer:    Leavy Cella RDCS (AE) Referring Phys: [id number] Arab  1. Difficult asssessment of LVEF in setting of aflutter as well as limited visualization. . Left ventricular ejection fraction, by estimation, is 50%. The left ventricle has mildly decreased function. The left ventricle demonstrates global hypokinesis. There is mild left ventricular hypertrophy. Left ventricular diastolic parameters are indeterminate.  2. Right ventricular systolic function was not well visualized. The right ventricular size is not well visualized.  3. Left atrial size was mildly dilated.  4. The mitral valve is normal in structure. No evidence of mitral valve regurgitation. No evidence of mitral stenosis.  5. The aortic valve is tricuspid. Aortic valve regurgitation is not visualized. No aortic stenosis is present.  6. The inferior vena cava is normal in size with greater than 50% respiratory variability, suggesting right atrial pressure of 3 mmHg. FINDINGS  Left Ventricle: Difficult asssessment of LVEF in setting of aflutter as well as limited visualization. Left ventricular ejection fraction, by estimation, is 50%. The left ventricle has mildly decreased function. The left ventricle demonstrates global hypokinesis. The left ventricular internal cavity size was normal in size. There is mild left ventricular hypertrophy. Left ventricular diastolic parameters are indeterminate. Right Ventricle: The right ventricular size is not well visualized.  Right vetricular wall thickness was not well visualized. Right ventricular systolic function was not well visualized. Left Atrium: Left atrial size was mildly dilated. Right Atrium: Right atrial size was normal in size. Pericardium: There is no evidence of pericardial effusion. Mitral Valve: The mitral valve is normal in structure. No evidence of mitral valve regurgitation. No evidence of mitral valve stenosis. Tricuspid Valve: The tricuspid valve  is normal in structure. Tricuspid valve regurgitation is mild . No evidence of tricuspid stenosis. Aortic Valve: The aortic valve is tricuspid. Aortic valve regurgitation is not visualized. No aortic stenosis is present. Aortic valve mean gradient measures 5.6 mmHg. Aortic valve peak gradient measures 13.8 mmHg. Aortic valve area, by VTI measures 1.51  cm. Pulmonic Valve: The pulmonic valve was not well visualized. Pulmonic valve regurgitation is not visualized. No evidence of pulmonic stenosis. Aorta: The aortic root is normal in size and structure. Pulmonary Artery: Indeterminant PASP, inadequate TR jet. Venous: The inferior vena cava was not well visualized. The inferior vena cava is normal in size with greater than 50% respiratory variability, suggesting right atrial pressure of 3 mmHg. IAS/Shunts: The interatrial septum was not well visualized.  LEFT VENTRICLE PLAX 2D LVIDd:         3.37 cm  Diastology LVIDs:         2.60 cm  LV e' medial:    5.10 cm/s LV PW:         1.28 cm  LV E/e' medial:  14.8 LV IVS:        1.18 cm  LV e' lateral:   7.13 cm/s LVOT diam:     1.80 cm  LV E/e' lateral: 10.6 LV SV:         41 LV SV Index:   22 LVOT Area:     2.54 cm  RIGHT VENTRICLE RV S prime:     9.98 cm/s TAPSE (M-mode): 1.5 cm LEFT ATRIUM           Index       RIGHT ATRIUM           Index LA diam:      4.60 cm 2.46 cm/m  RA Area:     14.40 cm LA Vol (A4C): 55.7 ml 29.73 ml/m RA Volume:   36.60 ml  19.53 ml/m  AORTIC VALVE AV Area (Vmax):    1.62 cm AV Area (Vmean):   1.51  cm AV Area (VTI):     1.51 cm AV Vmax:           185.81 cm/s AV Vmean:          107.569 cm/s AV VTI:            0.269 m AV Peak Grad:      13.8 mmHg AV Mean Grad:      5.6 mmHg LVOT Vmax:         118.15 cm/s LVOT Vmean:        63.734 cm/s LVOT VTI:          0.159 m LVOT/AV VTI ratio: 0.59  AORTA Ao Root diam: 2.80 cm MITRAL VALVE               TRICUSPID VALVE MV Area (PHT): 4.71 cm    TR Peak grad:   44.9 mmHg MV Decel Time: 161 msec    TR Vmax:        335.00 cm/s MV E velocity: 75.50 cm/s MV A velocity: 29.50 cm/s  SHUNTS MV E/A ratio:  2.56        Systemic VTI:  0.16 m                            Systemic Diam: 1.80 cm Carlyle Dolly MD Electronically signed by Carlyle Dolly MD Signature Date/Time: 07/02/2020/12:18:41 PM    Final     Patient Profile  54 y.o. female with a history of CAD status post CABG, chronic combined heart failure, chronic hypoxic respiratory failure with tracheostomy in place, and newly documented persistent atrial fibrillation/flutter.  Assessment & Plan    1.  Newly documented, persistent atrial fibrillation/flutter.  Heart rate is controlled at this time on combination of Lanoxin, Lopressor, and IV amiodarone.  She is on Coumadin for stroke prophylaxis with INR 2.2.  2.  History of lupus anticoagulant and previous DVT, she was already anticoagulated on Coumadin for these diagnoses.  3.  Acute on chronic combined heart failure, follow-up echocardiogram reveals LVEF approximately 50%.  She continues to diurese on IV Bumex, 1400 cc out more than in last 24 hours.  Chest x-ray shows vascular congestion with improving atelectasis.  BNP 480 down from 536.  4.  Minor high-sensitivity troponin I elevation with peak of 31 in the setting of comorbid illness as discussed above, reflective of demand ischemia and not ACS.  5.  CAD status post CABG, no active angina symptoms.  Continue diuresis and supportive measures.  Heart rate controlled in atrial fibrillation/flutter on  combination of Lanoxin, Lopressor, and IV amiodarone.  Can eventually consider TEE guided cardioversion (INR subtherapeutic on October 13 in October 20), although if she continues to improve clinically and heart rate can be controlled with conversion to oral amiodarone, cardioversion may be able to be deferred for 3 weeks and then pursue a standard outpatient cardioversion without TEE.  Signed, Rozann Lesches, MD  07/03/2020, 9:07 AM

## 2020-07-03 NOTE — Progress Notes (Addendum)
ANTICOAGULATION CONSULT NOTE -   Pharmacy Consult for warfarin Indication: lupus anticoagulant and hx VTE  Patient Measurements: Height: 4\' 10"  (147.3 cm) Weight: 97 kg (213 lb 13.5 oz) IBW/kg (Calculated) : 40.9  Vital Signs: Temp: 98.2 F (36.8 C) (10/28 0755) Temp Source: Oral (10/28 0755) BP: 97/56 (10/28 0530) Pulse Rate: 80 (10/28 0801)  Labs: Recent Labs    07/01/20 0319 07/02/20 0505 07/03/20 0409  HGB 11.9* 12.4  --   HCT 37.4 40.7  --   PLT 193 220  --   LABPROT 25.2* 25.5* 23.8*  INR 2.4* 2.4* 2.2*  CREATININE 0.81 0.94 0.98    Estimated Creatinine Clearance: 65.6 mL/min (by C-G formula based on SCr of 0.98 mg/dL).  Assessment: 61 yof who presents to the ED with increasing SOB and bilateral leg swelling. She was recently discharged from the hospital on 10/15. She takes warfarin PTA for lupus anticoagulant and hx VTE. Pharmacy consulted to dose warfarin while inpatient. With addition of amiodarone on 10/24 will likely interact with coumadin and dose amount will be less.   Home dose:  2.5 mg daily except 5 mg Mon-Wed-Fri  07/03/20 update: INR: 2.2-->dropped by 0.2  CBC: Hb 12.4 (stable)  Plates: 193>220 RN reports no signs or symptoms of bleeding at this time    Goal of Therapy:  INR 2-3 Monitor platelets by anticoagulation protocol: Yes   Plan:  Give warfarin 5 mg po x1 dose today (100% boost over usual home due to drop in INR) Daily INR and every other day CBC Monitor for signs and symptoms  of bleeding  Thank you allowing  pharmacy to participate in this patient's care.  [name], Pharm. D. Clinical Pharmacist 07/03/2020 9:46 AM

## 2020-07-03 NOTE — Progress Notes (Addendum)
PROGRESS NOTE    Theresa [name]  NTI:[id number] DOB: [birth year] DOA: 06/25/2020 PCP: Monico Blitz, MD   Brief Narrative:  Per HPI: Theresa [name] Theresa [name] a 54 y.o.femalewith medical history significant forchronic respiratory failure on 4 L, systolic and diastolic CHF, coronary artery disease, DVT, diabetes mellitus, hypertension, psoriasis. Patient was seen today by advanced home care nurse.It was found that patient had gained 14 pounds since she was recently discharged from the hospital, patient was also having increasing difficulty breathing, with bilateral lower extremity swelling and bloating. Patient was referred to the ED. Patient reports compliance with her torsemide40mg  BID,and denies dietary indiscretion. Patient has a trach. She reports productive cough, wheezing since yesterday. No chest pain.  Recent hospitalization 10/9-10/15,for severe sepsis due to E. coli bacteremia,cholecystitis,lactic acid peaked at 2.8, she was treated with ceftriaxone and metronidazole, and transitioned over to Augmentin on discharge. General surgery recommended nonoperative management cholecystitis, as patient is not a good surgical candidate. She was also managed for acute on chronic hypoxic respiratory failure, and decompensated CHF,COPD exacerbation,requiring 10 L of oxygen,she was discharged on her baseline 4 L.  -Patient admitted with acute on chronic combined diastolic and systolic heart failure with associated acute on chronic hypoxemic respiratory failure.  She was having difficulty with diuresis on IV Lasix and doses were being increased up to IV 80 mg 3 times daily.  Unfortunately on 10/24 she developed new onset atrial flutter with RVR and was transferred to stepdown unit and started on amiodarone drip on account of soft blood pressure readings.  She continued to have elevated heart rate readings for which repeat bolus and digoxin were given on 10/25 with improvement in blood pressure  readings.  She is now starting to diurese better with IV Bumex and heart rates improved on IV amiodarone.  Assessment & Plan:   Principal Problem:   Acute on chronic combined systolic and diastolic HF (heart failure) (HCC) Active Problems:   DM (diabetes mellitus) ([place])   Essential hypertension   OSA (obstructive sleep apnea)   Lupus anticoagulant syndrome (HCC)   CAD (coronary artery disease) of artery bypass graft: PTCA/DES to distal body VG to PDA 10/29/15   Tracheostomy in place [facility]), chronic since 2002   Acute on chronic respiratory failure with hypoxia ([place])   Uncontrolled type 2 diabetes mellitus with hyperglycemia, with long-term current use of insulin (HCC)   Acute on chronic respiratory failure with hypoxia (HCC)   Chronic respiratory insufficiency   DVT (deep venous thrombosis) (HCC)   Atrial flutter with rapid ventricular response (HCC)   Acute on chronic hypoxemic respiratory failure secondary to acute on chronic combined diastolic and systolic heart failure -down to 8 L oxygen via trach collar.  She wears 4 L total at baseline. -She reports compliance with 40 mg torsemide twice daily at home -Continue diuresis with Bumex as ordered 2 mg IV twice daily with -4600 mL output total -Follow daily BMP, daily weights, strict I's and O's -Appreciate cardiology evaluation -Holding home losartan and metoprolol with holding parameters due to softening blood pressure readings.  New onset atrial flutter with RVR -With soft blood pressure readings, remains on IV amiodarone infusion, digoxin and metoprolol -Appreciate cardiology recommendations for further management -TSH 4.4 -Patient anticoagulated with warfarin  Elevated troponin secondary to above in setting of CAD -Likely some mild demand ischemia with flat trend noted and no chest pain  Hypokalemia  -oral potassium replacement ordered 60 meq daily  COPD -No acute bronchospasms currently noted -Continue  mucolytics -  Duo nebs to Xopenex as needed for shortness of breath or wheezing -Flutter valve  Lupus anticoagulant syndrome/history of DVT on chronic anticoagulation -Continue warfarin with pharmacy to dose, follow daily PT/INR  History of CABG -No chest pain currently noted -Continue warfarin  Uncontrolled type 2 diabetes likely related to steroid-induced hyperglycemia, improving -Continue SSI on aggressive scale while holding Ozempic -Continue home lantus 25 units plus prandial novolog with meals  Depression -Continue home sertraline and lorazepam  DVT prophylaxis:Warfarin Code Status:Full code Family Communication:sister phone 10/26, T/C to spouse no answer, L/M 10/28 Disposition Plan: Status is: Inpatient  Remains inpatient appropriate because:IV treatments appropriate due to intensity of illness or inability to take PO and Inpatient level of care appropriate due to severity of illness  Dispo: The patient is from:Home Anticipated d/c is KW:IOXB Anticipated d/c date is: 3days Patient currently is not medically stable to d/c.She requires ongoing diuresis and inpatient managementfor heart rate control.  Cardiology considering inpatient TEE cardioversion.  Further recommendations to follow.   Consultants:  Cardiology  Procedures:  See below  Antimicrobials:   None  Subjective: Patient reports No CP, no palpitations, no SOB.    Objective: Vitals:   07/03/20 0755 07/03/20 0800 07/03/20 0801 07/03/20 0900  BP:      Pulse: 63 (!) 59 80 87  Resp: 19 18 (!) 21 17  Temp: 98.2 F (36.8 C)     TempSrc: Oral     SpO2: 93% 90% 93% 94%  Weight:      Height:        Intake/Output Summary (Last 24 hours) at 07/03/2020 1003 Last data filed at 07/03/2020 0534 Gross per 24 hour  Intake --  Output 1400 ml  Net -1400 ml   Filed Weights   06/29/20 0500 06/29/20 0858 06/30/20 0500  Weight: 75.2 kg 97.2 kg 97  kg    Examination:  General exam: chronically ill appearing, awake, alert, NAD.  Respiratory system: bibasilar crackles. No increased work of breathing.  Tracheostomy with trach collar at 10 L in place. Cardiovascular system: normal S1 & S2 heard, irregularly irregular, but rate controlled Gastrointestinal system: Abdomen is nondistended, soft and nontender.  Central nervous system: Alert and awake Extremities: bilateral lower extremity edema slightly improved Skin: No rashes, lesions or ulcers Psychiatry: Judgement and insight appear normal. Mood & affect appropriate.   Data Reviewed: I have personally reviewed following labs and imaging studies  CBC: Recent Labs  Lab 06/28/20 0559 06/29/20 0529 06/30/20 0354 07/01/20 0319 07/02/20 0505  WBC 10.0 7.5 6.7 6.9 7.1  HGB 13.1 13.4 12.1 11.9* 12.4  HCT 43.0 43.1 38.4 37.4 40.7  MCV 99.5 98.2 98.5 97.1 99.5  PLT 201 192 191 193 353   Basic Metabolic Panel: Recent Labs  Lab 06/29/20 0529 06/30/20 0354 07/01/20 0319 07/02/20 0505 07/03/20 0409  NA 133* 135 137 138 137  K 3.7 4.1 3.5 3.9 3.3*  CL 87* 90* 90* 90* 90*  CO2 34* 35* 37* 38* 37*  GLUCOSE 218* 123* 233* 103* 128*  BUN 23* 33* 30* 29* 32*  CREATININE 0.97 0.95 0.81 0.94 0.98  CALCIUM 8.9 8.8* 8.4* 8.3* 9.0  MG 1.9 2.2 1.9 1.9 1.9   GFR: Estimated Creatinine Clearance: 65.6 mL/min (by C-G formula based on SCr of 0.98 mg/dL). Liver Function Tests: No results for input(s): AST, ALT, ALKPHOS, BILITOT, PROT, ALBUMIN in the last 168 hours. No results for input(s): LIPASE, AMYLASE in the last 168 hours. No results for input(s): AMMONIA in the  last 168 hours. Coagulation Profile: Recent Labs  Lab 06/29/20 0529 06/30/20 0354 07/01/20 0319 07/02/20 0505 07/03/20 0409  INR 2.3* 2.4* 2.4* 2.4* 2.2*   Cardiac Enzymes: No results for input(s): CKTOTAL, CKMB, CKMBINDEX, TROPONINI in the last 168 hours. BNP (last 3 results) No results for input(s): PROBNP in the  last 8760 hours. HbA1C: No results for input(s): HGBA1C in the last 72 hours. CBG: Recent Labs  Lab 07/02/20 1152 07/02/20 1641 07/02/20 2147 07/02/20 2238 07/03/20 0755  GLUCAP 236* 195* 68* 95 130*   Lipid Profile: No results for input(s): CHOL, HDL, LDLCALC, TRIG, CHOLHDL, LDLDIRECT in the last 72 hours. Thyroid Function Tests: No results for input(s): TSH, T4TOTAL, FREET4, T3FREE, THYROIDAB in the last 72 hours. Anemia Panel: No results for input(s): VITAMINB12, FOLATE, FERRITIN, TIBC, IRON, RETICCTPCT in the last 72 hours. Sepsis Labs: Recent Labs  Lab 06/27/20 1424  PROCALCITON <0.10    Recent Results (from the past 240 hour(s))  Respiratory Panel by RT PCR (Flu A&B, Covid) - Nasopharyngeal Swab     Status: None   Collection Time: 06/25/20  4:07 PM   Specimen: Nasopharyngeal Swab  Result Value Ref Range Status   SARS Coronavirus 2 by RT PCR NEGATIVE NEGATIVE Final    Comment: (NOTE) SARS-CoV-2 target nucleic acids are NOT DETECTED.  The SARS-CoV-2 RNA is generally detectable in upper respiratoy specimens during the acute phase of infection. The lowest concentration of SARS-CoV-2 viral copies this assay can detect is 131 copies/mL. A negative result does not preclude SARS-Cov-2 infection and should not be used as the sole basis for treatment or other patient management decisions. A negative result may occur with  improper specimen collection/handling, submission of specimen other than nasopharyngeal swab, presence of viral mutation(s) within the areas targeted by this assay, and inadequate number of viral copies (<131 copies/mL). A negative result must be combined with clinical observations, patient history, and epidemiological information. The expected result is Negative.  Fact Sheet for Patients:  PinkCheek.be  Fact Sheet for Healthcare Providers:  GravelBags.it  This test is no t yet approved or  cleared by the [place] FDA and  has been authorized for detection and/or diagnosis of SARS-CoV-2 by FDA under an Emergency Use Authorization (EUA). This EUA will remain  in effect (meaning this test can be used) for the duration of the COVID-19 declaration under Section 564(b)(1) of the Act, 21 U.S.C. section 360bbb-3(b)(1), unless the authorization is terminated or revoked sooner.     Influenza A by PCR NEGATIVE NEGATIVE Final   Influenza B by PCR NEGATIVE NEGATIVE Final    Comment: (NOTE) The Xpert Xpress SARS-CoV-2/FLU/RSV assay is intended as an aid in  the diagnosis of influenza from Nasopharyngeal swab specimens and  should not be used as a sole basis for treatment. Nasal washings and  aspirates are unacceptable for Xpert Xpress SARS-CoV-2/FLU/RSV  testing.  Fact Sheet for Patients: PinkCheek.be  Fact Sheet for Healthcare Providers: GravelBags.it  This test is not yet approved or cleared by the [place] FDA and  has been authorized for detection and/or diagnosis of SARS-CoV-2 by  FDA under an Emergency Use Authorization (EUA). This EUA will remain  in effect (meaning this test can be used) for the duration of the  Covid-19 declaration under Section 564(b)(1) of the Act, 21  U.S.C. section 360bbb-3(b)(1), unless the authorization is  terminated or revoked. Performed at [facility], [address], [place], [place] [address]   MRSA PCR Screening  Status: None   Collection Time: 06/29/20  9:12 AM   Specimen: Nasal Mucosa; Nasopharyngeal  Result Value Ref Range Status   MRSA by PCR NEGATIVE NEGATIVE Final    Comment:        The GeneXpert MRSA Assay (FDA approved for NASAL specimens only), is one component of a comprehensive MRSA colonization surveillance program. It is not intended to diagnose MRSA infection nor to guide or monitor treatment for MRSA infections. Performed at [facility],  [address] [place], [place] [address]      Radiology Studies: [facility] 1 View  Result Date: 07/03/2020 CLINICAL DATA:  Shortness of breath. EXAM: PORTABLE CHEST 1 VIEW COMPARISON:  06/29/2020 FINDINGS: 0452 hours. The cardio pericardial silhouette is enlarged. There is pulmonary vascular congestion without overt pulmonary edema. Interstitial markings are diffusely coarsened with chronic features. Staple line projects over the right lung base with associated pleuroparenchymal scarring. Interval decrease in the basilar infiltrative opacity described previously. Bones are diffusely demineralized. Telemetry leads overlie the chest. IMPRESSION: Enlarged cardiopericardial silhouette with pulmonary vascular congestion. Interval improvement in basilar aeration. Electronically Signed   By: Misty Stanley M.D.   On: 07/03/2020 07:17   ECHOCARDIOGRAM COMPLETE  Result Date: 07/02/2020    ECHOCARDIOGRAM REPORT   Patient Name:   Theresa [name] Date of Exam: 07/02/2020 Medical Rec #:  [id number]       Height:       58.0 in Accession #:    [id number]      Weight:       213.8 lb Date of Birth:  08/13/1966       BSA:          1.874 m Patient Age:    88 years        BP:           106/59 mmHg Patient Gender: F               HR:           89 bpm. Exam Location:  [facility] Procedure: 2D Echo Indications:    Dyspnea 786.09 / R06.00  History:        Patient has prior history of Echocardiogram examinations, most                 recent 04/20/2020. CHF, CAD, Prior CABG, Arrythmias:Atrial                 Flutter, Signs/Symptoms:Bacteremia; Risk Factors:Diabetes,                 Hypertension and Current Smoker. PHTN.  Sonographer:    Leavy Cella RDCS (AE) Referring Phys: [id number] [place]  1. Difficult asssessment of LVEF in setting of aflutter as well as limited visualization. . Left ventricular ejection fraction, by estimation, is 50%. The left ventricle has mildly decreased function. The left ventricle  demonstrates global hypokinesis. There is mild left ventricular hypertrophy. Left ventricular diastolic parameters are indeterminate.  2. Right ventricular systolic function was not well visualized. The right ventricular size is not well visualized.  3. Left atrial size was mildly dilated.  4. The mitral valve is normal in structure. No evidence of mitral valve regurgitation. No evidence of mitral stenosis.  5. The aortic valve is tricuspid. Aortic valve regurgitation is not visualized. No aortic stenosis is present.  6. The inferior vena cava is normal in size with greater than 50% respiratory variability, suggesting right atrial pressure of 3 mmHg. FINDINGS  Left Ventricle: Difficult asssessment of LVEF in setting of aflutter as well as limited visualization. Left ventricular ejection fraction, by estimation, is 50%. The left ventricle has mildly decreased function. The left ventricle demonstrates global hypokinesis. The left ventricular internal cavity size was normal in size. There is mild left ventricular hypertrophy. Left ventricular diastolic parameters are indeterminate. Right Ventricle: The right ventricular size is not well visualized. Right vetricular wall thickness was not well visualized. Right ventricular systolic function was not well visualized. Left Atrium: Left atrial size was mildly dilated. Right Atrium: Right atrial size was normal in size. Pericardium: There is no evidence of pericardial effusion. Mitral Valve: The mitral valve is normal in structure. No evidence of mitral valve regurgitation. No evidence of mitral valve stenosis. Tricuspid Valve: The tricuspid valve is normal in structure. Tricuspid valve regurgitation is mild . No evidence of tricuspid stenosis. Aortic Valve: The aortic valve is tricuspid. Aortic valve regurgitation is not visualized. No aortic stenosis is present. Aortic valve mean gradient measures 5.6 mmHg. Aortic valve peak gradient measures 13.8 mmHg. Aortic valve area,  by VTI measures 1.51  cm. Pulmonic Valve: The pulmonic valve was not well visualized. Pulmonic valve regurgitation is not visualized. No evidence of pulmonic stenosis. Aorta: The aortic root is normal in size and structure. Pulmonary Artery: Indeterminant PASP, inadequate TR jet. Venous: The inferior vena cava was not well visualized. The inferior vena cava is normal in size with greater than 50% respiratory variability, suggesting right atrial pressure of 3 mmHg. IAS/Shunts: The interatrial septum was not well visualized.  LEFT VENTRICLE PLAX 2D LVIDd:         3.37 cm  Diastology LVIDs:         2.60 cm  LV e' medial:    5.10 cm/s LV PW:         1.28 cm  LV E/e' medial:  14.8 LV IVS:        1.18 cm  LV e' lateral:   7.13 cm/s LVOT diam:     1.80 cm  LV E/e' lateral: 10.6 LV SV:         41 LV SV Index:   22 LVOT Area:     2.54 cm  RIGHT VENTRICLE RV S prime:     9.98 cm/s TAPSE (M-mode): 1.5 cm LEFT ATRIUM           Index       RIGHT ATRIUM           Index LA diam:      4.60 cm 2.46 cm/m  RA Area:     14.40 cm LA Vol (A4C): 55.7 ml 29.73 ml/m RA Volume:   36.60 ml  19.53 ml/m  AORTIC VALVE AV Area (Vmax):    1.62 cm AV Area (Vmean):   1.51 cm AV Area (VTI):     1.51 cm AV Vmax:           185.81 cm/s AV Vmean:          107.569 cm/s AV VTI:            0.269 m AV Peak Grad:      13.8 mmHg AV Mean Grad:      5.6 mmHg LVOT Vmax:         118.15 cm/s LVOT Vmean:        63.734 cm/s LVOT VTI:          0.159 m LVOT/AV VTI ratio: 0.59  AORTA Ao Root diam: 2.80 cm  MITRAL VALVE               TRICUSPID VALVE MV Area (PHT): 4.71 cm    TR Peak grad:   44.9 mmHg MV Decel Time: 161 msec    TR Vmax:        335.00 cm/s MV E velocity: 75.50 cm/s MV A velocity: 29.50 cm/s  SHUNTS MV E/A ratio:  2.56        Systemic VTI:  0.16 m                            Systemic Diam: 1.80 cm Carlyle Dolly MD Electronically signed by Carlyle Dolly MD Signature Date/Time: 07/02/2020/12:18:41 PM    Final     Scheduled Meds: . bumetanide  (BUMEX) IV  2 mg Intravenous Q12H  . Chlorhexidine Gluconate Cloth  6 each Topical Daily  . digoxin  0.25 mg Oral Daily  . gabapentin  900 mg Oral QHS  . insulin aspart  0-20 Units Subcutaneous TID WC  . insulin aspart  0-5 Units Subcutaneous QHS  . insulin aspart  4 Units Subcutaneous TID WC  . insulin glargine  25 Units Subcutaneous QHS  . metoprolol tartrate  25 mg Oral TID  . pantoprazole  40 mg Oral Daily  . potassium chloride  60 mEq Oral Daily  . sertraline  50 mg Oral QHS  . tamsulosin  0.4 mg Oral Daily  . warfarin  5 mg Oral ONCE-1600  . Warfarin - Pharmacist Dosing Inpatient   Does not apply q1600   Continuous Infusions: . amiodarone 30 mg/hr (07/03/20 0534)  . magnesium sulfate bolus IVPB       LOS: 8 days   Critical Care Procedure Note Authorized and Performed by: Murvin Natal MD  Total Critical Care time:  31 minutes Due to a high probability of clinically significant, life threatening deterioration, the patient required my highest level of preparedness to intervene emergently and I personally spent this critical care time directly and personally managing the patient.  This critical care time included obtaining a history; examining the patient, pulse oximetry; ordering and review of studies; arranging urgent treatment with development of a management plan; evaluation of patient's response of treatment; frequent reassessment; and discussions with other providers.  This critical care time was performed to assess and manage the high probability of imminent and life threatening deterioration that could result in multi-organ failure.  It was exclusive of separately billable procedures and treating other patients and teaching time.   Irwin Brakeman, MD [facility]  If 7PM-7AM, please contact night-coverage [email or web address] 07/03/2020, 10:03 AM

## 2020-07-04 DIAGNOSIS — I5033 Acute on chronic diastolic (congestive) heart failure: Secondary | ICD-10-CM

## 2020-07-04 DIAGNOSIS — I2510 Atherosclerotic heart disease of native coronary artery without angina pectoris: Secondary | ICD-10-CM | POA: Diagnosis not present

## 2020-07-04 DIAGNOSIS — I10 Essential (primary) hypertension: Secondary | ICD-10-CM | POA: Diagnosis not present

## 2020-07-04 DIAGNOSIS — J449 Chronic obstructive pulmonary disease, unspecified: Secondary | ICD-10-CM | POA: Diagnosis not present

## 2020-07-04 DIAGNOSIS — E119 Type 2 diabetes mellitus without complications: Secondary | ICD-10-CM | POA: Diagnosis not present

## 2020-07-04 DIAGNOSIS — I5043 Acute on chronic combined systolic (congestive) and diastolic (congestive) heart failure: Secondary | ICD-10-CM | POA: Diagnosis not present

## 2020-07-04 DIAGNOSIS — M159 Polyosteoarthritis, unspecified: Secondary | ICD-10-CM | POA: Diagnosis not present

## 2020-07-04 DIAGNOSIS — I4892 Unspecified atrial flutter: Secondary | ICD-10-CM | POA: Diagnosis not present

## 2020-07-04 LAB — GLUCOSE, CAPILLARY
Glucose-Capillary: 88 mg/dL (ref 70–99)
Glucose-Capillary: 335 mg/dL — ABNORMAL HIGH (ref 70–99)
Glucose-Capillary: 246 mg/dL — ABNORMAL HIGH (ref 70–99)
Glucose-Capillary: 147 mg/dL — ABNORMAL HIGH (ref 70–99)

## 2020-07-04 LAB — CBC
HCT: 37.4 % (ref 36.0–46.0)
Hemoglobin: 11.5 g/dL — ABNORMAL LOW (ref 12.0–15.0)
MCH: 30.4 pg (ref 26.0–34.0)
MCHC: 30.7 g/dL (ref 30.0–36.0)
MCV: 98.9 fL (ref 80.0–100.0)
Platelets: 230 10*3/uL (ref 150–400)
RBC: 3.78 MIL/uL — ABNORMAL LOW (ref 3.87–5.11)
RDW: 15.7 % — ABNORMAL HIGH (ref 11.5–15.5)
WBC: 7.3 10*3/uL (ref 4.0–10.5)
nRBC: 0 % (ref 0.0–0.2)

## 2020-07-04 LAB — PROTIME-INR
INR: 2.5 — ABNORMAL HIGH (ref 0.8–1.2)
Prothrombin Time: 26.2 seconds — ABNORMAL HIGH (ref 11.4–15.2)

## 2020-07-04 LAB — BASIC METABOLIC PANEL
Anion gap: 9 (ref 5–15)
BUN: 29 mg/dL — ABNORMAL HIGH (ref 6–20)
CO2: 39 mmol/L — ABNORMAL HIGH (ref 22–32)
Calcium: 9.1 mg/dL (ref 8.9–10.3)
Chloride: 91 mmol/L — ABNORMAL LOW (ref 98–111)
Creatinine, Ser: 0.86 mg/dL (ref 0.44–1.00)
GFR, Estimated: >60 mL/min (ref >60)
Glucose, Bld: 84 mg/dL (ref 70–99)
Potassium: 3.9 mmol/L (ref 3.5–5.1)
Sodium: 139 mmol/L (ref 135–145)

## 2020-07-04 LAB — MAGNESIUM: Magnesium: 2 mg/dL (ref 1.7–2.4)

## 2020-07-04 MED ORDER — METOLAZONE 5 MG PO TABS
2.5000 mg | ORAL_TABLET | Freq: Once | ORAL | Status: AC
  Administered 2020-07-04: 2.5 mg via ORAL
  Filled 2020-07-04: qty 1

## 2020-07-04 MED ORDER — WARFARIN SODIUM 2.5 MG PO TABS
2.5000 mg | ORAL_TABLET | Freq: Once | ORAL | Status: AC
  Administered 2020-07-04: 2.5 mg via ORAL
  Filled 2020-07-04: qty 1

## 2020-07-04 MED ORDER — AMIODARONE HCL 200 MG PO TABS
400.0000 mg | ORAL_TABLET | Freq: Two times a day (BID) | ORAL | Status: DC
  Administered 2020-07-04 – 2020-07-07 (×7): 400 mg via ORAL
  Filled 2020-07-04 (×7): qty 2

## 2020-07-04 NOTE — Progress Notes (Signed)
Progress Note  Patient Name: Theresa [name] Date of Encounter: 07/04/2020  [facility] Cardiologist: [name], MD   Subjective   Cough, SOB this AM Inpatient Medications    Scheduled Meds: . bumetanide (BUMEX) IV  2 mg Intravenous Q12H  . Chlorhexidine Gluconate Cloth  6 each Topical Daily  . digoxin  0.25 mg Oral Daily  . gabapentin  900 mg Oral QHS  . insulin aspart  0-20 Units Subcutaneous TID WC  . insulin aspart  0-5 Units Subcutaneous QHS  . insulin aspart  4 Units Subcutaneous TID WC  . insulin glargine  25 Units Subcutaneous QHS  . metoprolol tartrate  25 mg Oral TID  . pantoprazole  40 mg Oral Daily  . potassium chloride  60 mEq Oral Daily  . sertraline  50 mg Oral QHS  . tamsulosin  0.4 mg Oral Daily  . Warfarin - Pharmacist Dosing Inpatient   Does not apply q1600   Continuous Infusions: . amiodarone 30 mg/hr (07/04/20 0700)   PRN Meds: acetaminophen **OR** acetaminophen, guaiFENesin-dextromethorphan, levalbuterol, LORazepam, ondansetron **OR** ondansetron (ZOFRAN) IV, polyethylene glycol, sodium chloride flush   Vital Signs    Vitals:   07/04/20 0500 07/04/20 0600 07/04/20 0700 07/04/20 0756  BP: 128/68 102/61 (!) 106/47   Pulse: (!) 53 (!) 51 64   Resp: 18 18 17    Temp:  (!) 97.5 F (36.4 C)  97.6 F (36.4 C)  TempSrc:  Axillary  Oral  SpO2: 95% 96% 94%   Weight: 96.2 kg     Height:        Intake/Output Summary (Last 24 hours) at 07/04/2020 0825 Last data filed at 07/04/2020 0734 Gross per 24 hour  Intake 1354.48 ml  Output 800 ml  Net 554.48 ml   Last 3 Weights 07/04/2020 06/30/2020 06/29/2020  Weight (lbs) 212 lb 1.3 oz 213 lb 13.5 oz 214 lb 4.6 oz  Weight (kg) 96.2 kg 97 kg 97.2 kg      Telemetry    Rate controlled aflutter - Personally Reviewed  ECG    n/a - Personally Reviewed  Physical Exam   GEN: No acute distress.   Neck: No JVD Cardiac: irreg, no murmurs, rubs, or gallops.  Respiratory: coarse  bilaterally GI: Soft, nontender, non-distended  MS: No edema; No deformity. Neuro:  Nonfocal  Psych: Normal affect   Labs    High Sensitivity Troponin:   Recent Labs  Lab 06/29/20 0910 06/29/20 1113  TROPONINIHS 24* 31*      Chemistry Recent Labs  Lab 07/02/20 0505 07/03/20 0409 07/04/20 0410  NA 138 137 139  K 3.9 3.3* 3.9  CL 90* 90* 91*  CO2 38* 37* 39*  GLUCOSE 103* 128* 84  BUN 29* 32* 29*  CREATININE 0.94 0.98 0.86  CALCIUM 8.3* 9.0 9.1  GFRNONAA >60 >60 >60  ANIONGAP 10 10 9      Hematology Recent Labs  Lab 07/01/20 0319 07/02/20 0505 07/04/20 0410  WBC 6.9 7.1 7.3  RBC 3.85* 4.09 3.78*  HGB 11.9* 12.4 11.5*  HCT 37.4 40.7 37.4  MCV 97.1 99.5 98.9  MCH 30.9 30.3 30.4  MCHC 31.8 30.5 30.7  RDW 15.9* 15.5 15.7*  PLT 193 220 230    BNP Recent Labs  Lab 07/03/20 0409  BNP 480.0*     DDimer No results for input(s): DDIMER in the last 168 hours.   Radiology    DG Chest Port 1 View  Result Date: 07/03/2020 CLINICAL DATA:  Shortness of  breath. EXAM: PORTABLE CHEST 1 VIEW COMPARISON:  06/29/2020 FINDINGS: 0452 hours. The cardio pericardial silhouette is enlarged. There is pulmonary vascular congestion without overt pulmonary edema. Interstitial markings are diffusely coarsened with chronic features. Staple line projects over the right lung base with associated pleuroparenchymal scarring. Interval decrease in the basilar infiltrative opacity described previously. Bones are diffusely demineralized. Telemetry leads overlie the chest. IMPRESSION: Enlarged cardiopericardial silhouette with pulmonary vascular congestion. Interval improvement in basilar aeration. Electronically Signed   By: Misty Stanley M.D.   On: 07/03/2020 07:17   ECHOCARDIOGRAM COMPLETE  Result Date: 07/02/2020    ECHOCARDIOGRAM REPORT   Patient Name:   Theresa [name] Date of Exam: 07/02/2020 Medical Rec #:  [id number]       Height:       58.0 in Accession #:    [id number]      Weight:        213.8 lb Date of Birth:  [birth year]       BSA:          1.874 m Patient Age:    54 years        BP:           106/59 mmHg Patient Gender: F               HR:           89 bpm. Exam Location:  [facility] Procedure: 2D Echo Indications:    Dyspnea 786.09 / R06.00  History:        Patient has prior history of Echocardiogram examinations, most                 recent 04/20/2020. CHF, CAD, Prior CABG, Arrythmias:Atrial                 Flutter, Signs/Symptoms:Bacteremia; Risk Factors:Diabetes,                 Hypertension and Current Smoker. PHTN.  Sonographer:    Leavy Cella RDCS (AE) Referring Phys: [id number] [place]  1. Difficult asssessment of LVEF in setting of aflutter as well as limited visualization. . Left ventricular ejection fraction, by estimation, is 50%. The left ventricle has mildly decreased function. The left ventricle demonstrates global hypokinesis. There is mild left ventricular hypertrophy. Left ventricular diastolic parameters are indeterminate.  2. Right ventricular systolic function was not well visualized. The right ventricular size is not well visualized.  3. Left atrial size was mildly dilated.  4. The mitral valve is normal in structure. No evidence of mitral valve regurgitation. No evidence of mitral stenosis.  5. The aortic valve is tricuspid. Aortic valve regurgitation is not visualized. No aortic stenosis is present.  6. The inferior vena cava is normal in size with greater than 50% respiratory variability, suggesting right atrial pressure of 3 mmHg. FINDINGS  Left Ventricle: Difficult asssessment of LVEF in setting of aflutter as well as limited visualization. Left ventricular ejection fraction, by estimation, is 50%. The left ventricle has mildly decreased function. The left ventricle demonstrates global hypokinesis. The left ventricular internal cavity size was normal in size. There is mild left ventricular hypertrophy. Left ventricular diastolic parameters are  indeterminate. Right Ventricle: The right ventricular size is not well visualized. Right vetricular wall thickness was not well visualized. Right ventricular systolic function was not well visualized. Left Atrium: Left atrial size was mildly dilated. Right Atrium: Right atrial size was normal in size. Pericardium: There is no evidence of pericardial effusion.  Mitral Valve: The mitral valve is normal in structure. No evidence of mitral valve regurgitation. No evidence of mitral valve stenosis. Tricuspid Valve: The tricuspid valve is normal in structure. Tricuspid valve regurgitation is mild . No evidence of tricuspid stenosis. Aortic Valve: The aortic valve is tricuspid. Aortic valve regurgitation is not visualized. No aortic stenosis is present. Aortic valve mean gradient measures 5.6 mmHg. Aortic valve peak gradient measures 13.8 mmHg. Aortic valve area, by VTI measures 1.51  cm. Pulmonic Valve: The pulmonic valve was not well visualized. Pulmonic valve regurgitation is not visualized. No evidence of pulmonic stenosis. Aorta: The aortic root is normal in size and structure. Pulmonary Artery: Indeterminant PASP, inadequate TR jet. Venous: The inferior vena cava was not well visualized. The inferior vena cava is normal in size with greater than 50% respiratory variability, suggesting right atrial pressure of 3 mmHg. IAS/Shunts: The interatrial septum was not well visualized.  LEFT VENTRICLE PLAX 2D LVIDd:         3.37 cm  Diastology LVIDs:         2.60 cm  LV e' medial:    5.10 cm/s LV PW:         1.28 cm  LV E/e' medial:  14.8 LV IVS:        1.18 cm  LV e' lateral:   7.13 cm/s LVOT diam:     1.80 cm  LV E/e' lateral: 10.6 LV SV:         41 LV SV Index:   22 LVOT Area:     2.54 cm  RIGHT VENTRICLE RV S prime:     9.98 cm/s TAPSE (M-mode): 1.5 cm LEFT ATRIUM           Index       RIGHT ATRIUM           Index LA diam:      4.60 cm 2.46 cm/m  RA Area:     14.40 cm LA Vol (A4C): 55.7 ml 29.73 ml/m RA Volume:   36.60  ml  19.53 ml/m  AORTIC VALVE AV Area (Vmax):    1.62 cm AV Area (Vmean):   1.51 cm AV Area (VTI):     1.51 cm AV Vmax:           185.81 cm/s AV Vmean:          107.569 cm/s AV VTI:            0.269 m AV Peak Grad:      13.8 mmHg AV Mean Grad:      5.6 mmHg LVOT Vmax:         118.15 cm/s LVOT Vmean:        63.734 cm/s LVOT VTI:          0.159 m LVOT/AV VTI ratio: 0.59  AORTA Ao Root diam: 2.80 cm MITRAL VALVE               TRICUSPID VALVE MV Area (PHT): 4.71 cm    TR Peak grad:   44.9 mmHg MV Decel Time: 161 msec    TR Vmax:        335.00 cm/s MV E velocity: 75.50 cm/s MV A velocity: 29.50 cm/s  SHUNTS MV E/A ratio:  2.56        Systemic VTI:  0.16 m                            Systemic  Diam: 1.80 cm Carlyle Dolly MD Electronically signed by Carlyle Dolly MD Signature Date/Time: 07/02/2020/12:18:41 PM    Final     Cardiac Studies     Patient Profile     54 y.o.femalewith a hx of chronic combined systyolic/diastolic HF, CAD with prior CABG, chronic respiratory failure with chronic trach,who is being seen today for the evaluation of SOB and leg edemaat the request of Dr Manuella Ghazi  Assessment & Plan  1. Acute on chronic combined systolic/diastolic HF - 03/9891 echo LVEF 40-45%, grade III DDx, normal RV function - 06/2020 echo LVEF 50%, indet DDx - admitted with 14 lbs weight gainfrom home report (from hopsital weights 5 lbs since discharge last week), LE edema. BNP in the 500s, pulm edema on cxr  I/Os are incomplete. She is on IV bumex 2mg  bid. Mild variations in Cr without clear trend. Weights appear inaccurate. Symptomatically she has been improving.   - repeat BNP 480, CXR with ongoing pulmonary  Congestion - continue IV bumex, dose oral metolazone 2.5mg  once today. Would dose again tomorrow if renal function remains stable.   2.Aflutter with RVR - new diagnosis this admission - soft bp's have affected manaement.  - has already been on coumadin for history of prior DVT/Lupus  antiocoagulant  06/25/20 INR was 1.8, 10/13 INR 1.6. she would require TEE if cardioverted. - on 10 L trach collar, respiratory status not stable for TEE/DCCV at this time.    - has been on amio gtt, loaded with IV digoxin now on oral 0.25mg  daily, oral lopressor - soft bp's at times but improving - we will stop amio gtt, start oral amio 400mg  bid x 7 days, then 200mg  bid x 2 weeks, then 200mg  daily.  - if rates remain controlled would anticipate outpatient cardioversion after 3 weeks of being therapeutic on coumadin.    3. Chronic respiraotry failure - has chronic trach, baseline 4L O2 at home  4. CAD - mild trop in setting of RVR, hypoxia - no plans for ischemic testing at this time   For questions or updates, please contact [facility] Please consult [email or web address] for contact info under        Signed, Carlyle Dolly, MD  07/04/2020, 8:25 AM

## 2020-07-04 NOTE — Progress Notes (Signed)
ANTICOAGULATION CONSULT NOTE -   Pharmacy Consult for warfarin Indication: lupus anticoagulant and hx VTE  Patient Measurements: Height: 4\' 10"  (147.3 cm) Weight: 96.2 kg (212 lb 1.3 oz) IBW/kg (Calculated) : 40.9  Vital Signs: Temp: 97.6 F (36.4 C) (10/29 0756) Temp Source: Oral (10/29 0756) BP: 122/70 (10/29 1012) Pulse Rate: 84 (10/29 1012)  Labs: Recent Labs    07/02/20 0505 07/03/20 0409 07/04/20 0410  HGB 12.4  --  11.5*  HCT 40.7  --  37.4  PLT 220  --  230  LABPROT 25.5* 23.8* 26.2*  INR 2.4* 2.2* 2.5*  CREATININE 0.94 0.98 0.86    Estimated Creatinine Clearance: 74.4 mL/min (by C-G formula based on SCr of 0.86 mg/dL).  Assessment: 24 yof who presents to the ED with increasing SOB and bilateral leg swelling. She was recently discharged from the hospital on 10/15. She takes warfarin PTA for lupus anticoagulant and hx VTE. Pharmacy consulted to dose warfarin while inpatient.     Home dose:  2.5 mg daily except 5 mg Mon-Wed-Fri  Drug Interactions: amiodarone (major)-->use caution  w/dosing   07/04/20 update: INR: 2.5->within goal range  CBC: Hb 12.4>11.5      Plates: 193>220>230 RN reports no signs or symptoms of bleeding at this time    Goal of Therapy:  INR 2-3 Monitor platelets by anticoagulation protocol: Yes   Plan:  Give warfarin 2. 5 mg po x1 dose today (50% less than  usual home due to possible amiodarone interaction) D in INR)aily INR and every other day CBC Monitor for signs and symptoms  of bleeding  Thank you allowing  pharmacy to participate in this patient's care.  [name], Pharm. D. Clinical Pharmacist 07/04/2020 10:48 AM

## 2020-07-04 NOTE — Progress Notes (Signed)
PROGRESS NOTE    Theresa [name]  ZSM:[id number] DOB: August 29, 1966 DOA: 06/25/2020 PCP: Monico Blitz, MD   Brief Narrative:  Per HPI: Theresa [name] a 54 y.o.femalewith medical history significant forchronic respiratory failure on 4 L, systolic and diastolic CHF, coronary artery disease, DVT, diabetes mellitus, hypertension, psoriasis. Patient was seen today by advanced home care nurse.It was found that patient had gained 14 pounds since she was recently discharged from the hospital, patient was also having increasing difficulty breathing, with bilateral lower extremity swelling and bloating. Patient was referred to the ED. Patient reports compliance with her torsemide40mg  BID,and denies dietary indiscretion. Patient has a trach. She reports productive cough, wheezing since yesterday. No chest pain.  Recent hospitalization 10/9-10/15,for severe sepsis due to E. coli bacteremia,cholecystitis,lactic acid peaked at 2.8, she was treated with ceftriaxone and metronidazole, and transitioned over to Augmentin on discharge. General surgery recommended nonoperative management cholecystitis, as patient is not a good surgical candidate. She was also managed for acute on chronic hypoxic respiratory failure, and decompensated CHF,COPD exacerbation,requiring 10 L of oxygen,she was discharged on her baseline 4 L.  -Patient admitted with acute on chronic combined diastolic and systolic heart failure with associated acute on chronic hypoxemic respiratory failure.  She was having difficulty with diuresis on IV Lasix and doses were being increased up to IV 80 mg 3 times daily.  Unfortunately on 10/24 she developed new onset atrial flutter with RVR and was transferred to stepdown unit and started on amiodarone drip on account of soft blood pressure readings.  She continued to have elevated heart rate readings for which repeat bolus and digoxin were given on 10/25 with improvement in blood pressure  readings.  She is now starting to diurese better with IV Bumex and heart rates improved on IV amiodarone.  Assessment & Plan:   Principal Problem:   Acute on chronic combined systolic and diastolic HF (heart failure) (HCC) Active Problems:   DM (diabetes mellitus) ([place])   Essential hypertension   OSA (obstructive sleep apnea)   Lupus anticoagulant syndrome (HCC)   CAD (coronary artery disease) of artery bypass graft: PTCA/DES to distal body VG to PDA 10/29/15   Tracheostomy in place [facility]), chronic since 2002   Acute on chronic respiratory failure with hypoxia ([place])   Uncontrolled type 2 diabetes mellitus with hyperglycemia, with long-term current use of insulin (HCC)   Acute on chronic respiratory failure with hypoxia (HCC)   Chronic respiratory insufficiency   DVT (deep venous thrombosis) (HCC)   Atrial flutter with rapid ventricular response (HCC)   Hypoxemia   Acute on chronic hypoxemic respiratory failure secondary to acute on chronic combined diastolic and systolic heart failure -down to 8 L oxygen via trach collar.  She wears 4 L total at baseline. -She reports compliance with 40 mg torsemide twice daily at home -Continue diuresis with Bumex as ordered 2 mg IV twice daily with -5200 mL output total -Follow daily BMP, daily weights, strict I's and O's -Appreciate cardiology evaluation -Holding home losartan and metoprolol with holding parameters due to softening blood pressure readings.  New onset atrial flutter with RVR -With soft blood pressure readings, remains on IV amiodarone infusion, digoxin and metoprolol -Appreciate cardiology recommendations for further management -TSH 4.4 -Patient anticoagulated with warfarin  Elevated troponin secondary to above in setting of CAD -Likely some mild demand ischemia with flat trend noted and no chest pain  Hypokalemia  -Repleted with oral potassium  COPD -No acute bronchospasms currently noted -Continue mucolytics -Duo  nebs to Xopenex as needed for shortness of breath or wheezing -Flutter valve  Lupus anticoagulant syndrome/history of DVT on chronic anticoagulation -Continue warfarin with pharmacy to dose, follow daily PT/INR  History of CABG -No chest pain currently noted -Continue warfarin  Uncontrolled type 2 diabetes likely related to steroid-induced hyperglycemia, improving -Continue SSI on aggressive scale while holding Ozempic -Continue home lantus 25 units plus prandial novolog with meals -continue to monitor CBGs closely.    Depression -Continue home sertraline and lorazepam  DVT prophylaxis:Warfarin Code Status:Full code Family Communication:sister phone 10/26, T/C to spouse no answer, L/M 10/28 Disposition Plan: Status is: Inpatient  Remains inpatient appropriate because:IV treatments appropriate due to intensity of illness or inability to take PO and Inpatient level of care appropriate due to severity of illness  Dispo: The patient is from:Home Anticipated d/c is [facility] Anticipated d/c date is: 3days Patient currently is not medically stable to d/c.She requires ongoing diuresis and inpatient managementfor heart rate control.  Cardiology considering inpatient TEE cardioversion.  Further recommendations to follow.   Consultants:  Cardiology  Procedures:  See below  Antimicrobials:   None  Subjective: Patient denies chest pain and palpitations.    Objective: Vitals:   07/04/20 1011 07/04/20 1012 07/04/20 1100 07/04/20 1212  BP: 122/70 122/70 (!) 141/126   Pulse: 80 84 65   Resp: 19  20   Temp:    97.8 F (36.6 C)  TempSrc:    Oral  SpO2: 96%  94%   Weight:      Height:        Intake/Output Summary (Last 24 hours) at 07/04/2020 1313 Last data filed at 07/04/2020 1000 Gross per 24 hour  Intake 1714.48 ml  Output 300 ml  Net 1414.48 ml   Filed Weights   06/29/20 0858 06/30/20 0500 07/04/20 0500    Weight: 97.2 kg 97 kg 96.2 kg    Examination:  General exam: chronically ill appearing, awake, alert, NAD.  Respiratory system:rare basilar crackles. No increased work of breathing.  Tracheostomy with trach collar at 10 L in place. Cardiovascular system: normal S1 & S2 heard, irregularly irregular, but rate controlled Gastrointestinal system: Abdomen is nondistended, soft and nontender.  Central nervous system: Alert and awake Extremities: bilateral lower extremity edema slightly improved Skin: No rashes, lesions or ulcers Psychiatry: Judgement and insight appear normal. Mood & affect appropriate.   Data Reviewed: I have personally reviewed following labs and imaging studies  CBC: Recent Labs  Lab 06/29/20 0529 06/30/20 0354 07/01/20 0319 07/02/20 0505 07/04/20 0410  WBC 7.5 6.7 6.9 7.1 7.3  HGB 13.4 12.1 11.9* 12.4 11.5*  HCT 43.1 38.4 37.4 40.7 37.4  MCV 98.2 98.5 97.1 99.5 98.9  PLT 192 191 193 220 081   Basic Metabolic Panel: Recent Labs  Lab 06/30/20 0354 07/01/20 0319 07/02/20 0505 07/03/20 0409 07/04/20 0410  NA 135 137 138 137 139  K 4.1 3.5 3.9 3.3* 3.9  CL 90* 90* 90* 90* 91*  CO2 35* 37* 38* 37* 39*  GLUCOSE 123* 233* 103* 128* 84  BUN 33* 30* 29* 32* 29*  CREATININE 0.95 0.81 0.94 0.98 0.86  CALCIUM 8.8* 8.4* 8.3* 9.0 9.1  MG 2.2 1.9 1.9 1.9 2.0   GFR: Estimated Creatinine Clearance: 74.4 mL/min (by C-G formula based on SCr of 0.86 mg/dL). Liver Function Tests: No results for input(s): AST, ALT, ALKPHOS, BILITOT, PROT, ALBUMIN in the last 168 hours. No results for input(s): LIPASE, AMYLASE in the last 168 hours. No results  for input(s): AMMONIA in the last 168 hours. Coagulation Profile: Recent Labs  Lab 06/30/20 0354 07/01/20 0319 07/02/20 0505 07/03/20 0409 07/04/20 0410  INR 2.4* 2.4* 2.4* 2.2* 2.5*   Cardiac Enzymes: No results for input(s): CKTOTAL, CKMB, CKMBINDEX, TROPONINI in the last 168 hours. BNP (last 3 results) No results  for input(s): PROBNP in the last 8760 hours. HbA1C: No results for input(s): HGBA1C in the last 72 hours. CBG: Recent Labs  Lab 07/03/20 1136 07/03/20 1613 07/03/20 2209 07/04/20 0738 07/04/20 1150  GLUCAP 350* 91 88 88 335*   Lipid Profile: No results for input(s): CHOL, HDL, LDLCALC, TRIG, CHOLHDL, LDLDIRECT in the last 72 hours. Thyroid Function Tests: No results for input(s): TSH, T4TOTAL, FREET4, T3FREE, THYROIDAB in the last 72 hours. Anemia Panel: No results for input(s): VITAMINB12, FOLATE, FERRITIN, TIBC, IRON, RETICCTPCT in the last 72 hours. Sepsis Labs: Recent Labs  Lab 06/27/20 1424  PROCALCITON <0.10    Recent Results (from the past 240 hour(s))  Respiratory Panel by RT PCR (Flu A&B, Covid) - Nasopharyngeal Swab     Status: None   Collection Time: 06/25/20  4:07 PM   Specimen: Nasopharyngeal Swab  Result Value Ref Range Status   SARS Coronavirus 2 by RT PCR NEGATIVE NEGATIVE Final    Comment: (NOTE) SARS-CoV-2 target nucleic acids are NOT DETECTED.  The SARS-CoV-2 RNA is generally detectable in upper respiratoy specimens during the acute phase of infection. The lowest concentration of SARS-CoV-2 viral copies this assay can detect is 131 copies/mL. A negative result does not preclude SARS-Cov-2 infection and should not be used as the sole basis for treatment or other patient management decisions. A negative result may occur with  improper specimen collection/handling, submission of specimen other than nasopharyngeal swab, presence of viral mutation(s) within the areas targeted by this assay, and inadequate number of viral copies (<131 copies/mL). A negative result must be combined with clinical observations, patient history, and epidemiological information. The expected result is Negative.  Fact Sheet for Patients:  PinkCheek.be  Fact Sheet for Healthcare Providers:  GravelBags.it  This test is  no t yet approved or cleared by the [place] FDA and  has been authorized for detection and/or diagnosis of SARS-CoV-2 by FDA under an Emergency Use Authorization (EUA). This EUA will remain  in effect (meaning this test can be used) for the duration of the COVID-19 declaration under Section 564(b)(1) of the Act, 21 U.S.C. section 360bbb-3(b)(1), unless the authorization is terminated or revoked sooner.     Influenza A by PCR NEGATIVE NEGATIVE Final   Influenza B by PCR NEGATIVE NEGATIVE Final    Comment: (NOTE) The Xpert Xpress SARS-CoV-2/FLU/RSV assay is intended as an aid in  the diagnosis of influenza from Nasopharyngeal swab specimens and  should not be used as a sole basis for treatment. Nasal washings and  aspirates are unacceptable for Xpert Xpress SARS-CoV-2/FLU/RSV  testing.  Fact Sheet for Patients: PinkCheek.be  Fact Sheet for Healthcare Providers: GravelBags.it  This test is not yet approved or cleared by the [place] FDA and  has been authorized for detection and/or diagnosis of SARS-CoV-2 by  FDA under an Emergency Use Authorization (EUA). This EUA will remain  in effect (meaning this test can be used) for the duration of the  Covid-19 declaration under Section 564(b)(1) of the Act, 21  U.S.C. section 360bbb-3(b)(1), unless the authorization is  terminated or revoked. Performed at [facility], [address], [place], [place] [address]   MRSA PCR  Screening     Status: None   Collection Time: 06/29/20  9:12 AM   Specimen: Nasal Mucosa; Nasopharyngeal  Result Value Ref Range Status   MRSA by PCR NEGATIVE NEGATIVE Final    Comment:        The GeneXpert MRSA Assay (FDA approved for NASAL specimens only), is one component of a comprehensive MRSA colonization surveillance program. It is not intended to diagnose MRSA infection nor to guide or monitor treatment for MRSA infections. Performed at  [facility], [address], [place],  [address]      Radiology Studies: [facility] 1 View  Result Date: 07/03/2020 CLINICAL DATA:  Shortness of breath. EXAM: PORTABLE CHEST 1 VIEW COMPARISON:  06/29/2020 FINDINGS: 0452 hours. The cardio pericardial silhouette is enlarged. There is pulmonary vascular congestion without overt pulmonary edema. Interstitial markings are diffusely coarsened with chronic features. Staple line projects over the right lung base with associated pleuroparenchymal scarring. Interval decrease in the basilar infiltrative opacity described previously. Bones are diffusely demineralized. Telemetry leads overlie the chest. IMPRESSION: Enlarged cardiopericardial silhouette with pulmonary vascular congestion. Interval improvement in basilar aeration. Electronically Signed   By: Misty Stanley M.D.   On: 07/03/2020 07:17    Scheduled Meds: . amiodarone  400 mg Oral BID  . bumetanide (BUMEX) IV  2 mg Intravenous Q12H  . Chlorhexidine Gluconate Cloth  6 each Topical Daily  . digoxin  0.25 mg Oral Daily  . gabapentin  900 mg Oral QHS  . insulin aspart  0-20 Units Subcutaneous TID WC  . insulin aspart  0-5 Units Subcutaneous QHS  . insulin aspart  4 Units Subcutaneous TID WC  . insulin glargine  25 Units Subcutaneous QHS  . metoprolol tartrate  25 mg Oral TID  . pantoprazole  40 mg Oral Daily  . potassium chloride  60 mEq Oral Daily  . sertraline  50 mg Oral QHS  . tamsulosin  0.4 mg Oral Daily  . warfarin  2.5 mg Oral ONCE-1600  . Warfarin - Pharmacist Dosing Inpatient   Does not apply q1600   Continuous Infusions:   LOS: 9 days   Irwin Brakeman, MD [facility]  If 7PM-7AM, please contact night-coverage [email or web address] 07/04/2020, 1:13 PM

## 2020-07-05 DIAGNOSIS — E119 Type 2 diabetes mellitus without complications: Secondary | ICD-10-CM | POA: Diagnosis not present

## 2020-07-05 DIAGNOSIS — I5033 Acute on chronic diastolic (congestive) heart failure: Secondary | ICD-10-CM | POA: Diagnosis not present

## 2020-07-05 DIAGNOSIS — I4892 Unspecified atrial flutter: Secondary | ICD-10-CM | POA: Diagnosis not present

## 2020-07-05 LAB — GLUCOSE, CAPILLARY
Glucose-Capillary: 132 mg/dL — ABNORMAL HIGH (ref 70–99)
Glucose-Capillary: 147 mg/dL — ABNORMAL HIGH (ref 70–99)
Glucose-Capillary: 147 mg/dL — ABNORMAL HIGH (ref 70–99)
Glucose-Capillary: 409 mg/dL — ABNORMAL HIGH (ref 70–99)

## 2020-07-05 LAB — BASIC METABOLIC PANEL
Anion gap: 10 (ref 5–15)
BUN: 35 mg/dL — ABNORMAL HIGH (ref 6–20)
CO2: 40 mmol/L — ABNORMAL HIGH (ref 22–32)
Calcium: 9.2 mg/dL (ref 8.9–10.3)
Chloride: 89 mmol/L — ABNORMAL LOW (ref 98–111)
Creatinine, Ser: 1.01 mg/dL — ABNORMAL HIGH (ref 0.44–1.00)
GFR, Estimated: >60 mL/min (ref >60)
Glucose, Bld: 153 mg/dL — ABNORMAL HIGH (ref 70–99)
Potassium: 3.9 mmol/L (ref 3.5–5.1)
Sodium: 139 mmol/L (ref 135–145)

## 2020-07-05 LAB — CBC
HCT: 36.4 % (ref 36.0–46.0)
Hemoglobin: 11.3 g/dL — ABNORMAL LOW (ref 12.0–15.0)
MCH: 30.6 pg (ref 26.0–34.0)
MCHC: 31 g/dL (ref 30.0–36.0)
MCV: 98.6 fL (ref 80.0–100.0)
Platelets: 251 10*3/uL (ref 150–400)
RBC: 3.69 MIL/uL — ABNORMAL LOW (ref 3.87–5.11)
RDW: 15.7 % — ABNORMAL HIGH (ref 11.5–15.5)
WBC: 6.7 10*3/uL (ref 4.0–10.5)
nRBC: 0 % (ref 0.0–0.2)

## 2020-07-05 LAB — PROTIME-INR
INR: 2.5 — ABNORMAL HIGH (ref 0.8–1.2)
Prothrombin Time: 25.9 seconds — ABNORMAL HIGH (ref 11.4–15.2)

## 2020-07-05 MED ORDER — INSULIN ASPART 100 UNIT/ML ~~LOC~~ SOLN
7.0000 [IU] | Freq: Once | SUBCUTANEOUS | Status: AC
  Administered 2020-07-05: 7 [IU] via SUBCUTANEOUS

## 2020-07-05 MED ORDER — BUMETANIDE 0.25 MG/ML IJ SOLN
2.0000 mg | Freq: Every day | INTRAMUSCULAR | Status: DC
Start: 2020-07-06 — End: 2020-07-06
  Filled 2020-07-05 (×2): qty 8

## 2020-07-05 MED ORDER — WARFARIN SODIUM 2.5 MG PO TABS
2.5000 mg | ORAL_TABLET | Freq: Once | ORAL | Status: AC
  Administered 2020-07-05: 2.5 mg via ORAL
  Filled 2020-07-05: qty 1

## 2020-07-05 NOTE — Progress Notes (Addendum)
PROGRESS NOTE    Theresa [name]  EPP:[id number] DOB: [birth year] DOA: 06/25/2020 PCP: Monico Blitz, MD   Brief Narrative:  Per HPI: Theresa [name] a 54 y.o.femalewith medical history significant forchronic respiratory failure on 4 L, systolic and diastolic CHF, coronary artery disease, DVT, diabetes mellitus, hypertension, psoriasis. Patient was seen today by advanced home care nurse.It was found that patient had gained 14 pounds since she was recently discharged from the hospital, patient was also having increasing difficulty breathing, with bilateral lower extremity swelling and bloating. Patient was referred to the ED. Patient reports compliance with her torsemide40mg  BID,and denies dietary indiscretion. Patient has a trach. She reports productive cough, wheezing since yesterday. No chest pain.  Recent hospitalization 10/9-10/15,for severe sepsis due to E. coli bacteremia,cholecystitis,lactic acid peaked at 2.8, she was treated with ceftriaxone and metronidazole, and transitioned over to Augmentin on discharge. General surgery recommended nonoperative management cholecystitis, as patient is not a good surgical candidate. She was also managed for acute on chronic hypoxic respiratory failure, and decompensated CHF,COPD exacerbation,requiring 10 L of oxygen,she was discharged on her baseline 4 L.  -Patient admitted with acute on chronic combined diastolic and systolic heart failure with associated acute on chronic hypoxemic respiratory failure.  She was having difficulty with diuresis on IV Lasix and doses were being increased up to IV 80 mg 3 times daily.  Unfortunately on 10/24 she developed new onset atrial flutter with RVR and was transferred to stepdown unit and started on amiodarone drip on account of soft blood pressure readings.  She continued to have elevated heart rate readings for which repeat bolus and digoxin were given on 10/25 with improvement in blood pressure  readings.  She is now starting to diurese better with IV Bumex and heart rates improved on IV amiodarone.  Assessment & Plan:   Principal Problem:   Acute on chronic combined systolic and diastolic HF (heart failure) (HCC) Active Problems:   DM (diabetes mellitus) ([place])   Essential hypertension   OSA (obstructive sleep apnea)   Lupus anticoagulant syndrome (HCC)   CAD (coronary artery disease) of artery bypass graft: PTCA/DES to distal body VG to PDA 10/29/15   Tracheostomy in place [facility]), chronic since 2002   Acute on chronic respiratory failure with hypoxia ([place])   Uncontrolled type 2 diabetes mellitus with hyperglycemia, with long-term current use of insulin (HCC)   Acute on chronic respiratory failure with hypoxia (HCC)   Chronic respiratory insufficiency   DVT (deep venous thrombosis) (HCC)   Atrial flutter with rapid ventricular response (HCC)   Hypoxemia  Acute on chronic hypoxemic respiratory failure secondary to acute on chronic combined diastolic and systolic heart failure -down to 4 L oxygen via trach collar.  She wears 4 L total at baseline. -She reports compliance with 40 mg torsemide twice daily at home -Continue diuresis with Bumex reduced to once daily with bump in BUN/Cr. Recheck in AM.  -Follow daily BMP, daily weights, strict I's and O's -Appreciate cardiology evaluation -Holding home losartan and metoprolol with holding parameters due to softening blood pressure readings.  New onset atrial flutter with RVR -Pt now on oral amiodarone taper, digoxin and metoprolol -Appreciate cardiology recommendations for further management -TSH 4.4 -Patient anticoagulated with warfarin (INR therapeutic)  Elevated troponin secondary to above in setting of CAD -Likely some mild demand ischemia with flat trend noted and no chest pain  Hypokalemia  -Repleted with oral potassium  COPD -No acute bronchospasms currently noted -Continue mucolytics -Duo nebs to Xopenex  as  needed for shortness of breath or wheezing -Flutter valve  Lupus anticoagulant syndrome/history of DVT on chronic anticoagulation -Continue warfarin with pharmacy to dose, follow daily PT/INR  History of CABG -No chest pain currently noted -Continue warfarin  Uncontrolled type 2 diabetes likely related to steroid-induced hyperglycemia, improving -Continue SSI on aggressive scale while holding Ozempic -Continue home lantus 25 units plus prandial novolog with meals -continue to monitor CBGs closely.    Depression -Continue home sertraline and lorazepam  DVT prophylaxis:Warfarin Code Status:Full code Family Communication:sister phone 10/26, T/C to spouse 10/30 updated Disposition Plan:Home  Status is: Inpatient  Remains inpatient appropriate because:IV treatments appropriate due to intensity of illness or inability to take PO and Inpatient level of care appropriate due to severity of illness  Dispo: The patient is from:Home Anticipated d/c is JJ:[id number] Anticipated d/c date is: 2-3days Patient currently is not medically stable to d/c.She requires ongoing diuresis and inpatient managementfor heart rate control.  Cardiology considering inpatient TEE cardioversion.  Further recommendations to follow.   Consultants:  Cardiology  Procedures:  See below  Antimicrobials:   None  Subjective: Patient reports no complaints today, denies chest pain and palpitations.     Objective: Vitals:   07/05/20 0500 07/05/20 0731 07/05/20 0921 07/05/20 1138  BP:      Pulse:   65   Resp:      Temp:      TempSrc:      SpO2:  97%  99%  Weight: 98.8 kg     Height:        Intake/Output Summary (Last 24 hours) at 07/05/2020 1154 Last data filed at 07/05/2020 0900 Gross per 24 hour  Intake 887.19 ml  Output --  Net 887.19 ml   Filed Weights   06/30/20 0500 07/04/20 0500 07/05/20 0500  Weight: 97 kg 96.2 kg 98.8 kg    Examination:  General exam: chronically ill appearing, awake, alert, NAD.  Respiratory system:rare basilar crackles. No increased work of breathing.  Tracheostomy with trach collar at 4L in place. Cardiovascular system: normal S1 & S2 heard, irregularly irregular, but rate controlled Gastrointestinal system: Abdomen is nondistended, soft and nontender.  Central nervous system: Alert and awake Extremities: bilateral lower extremity edema slightly improved Skin: No rashes, lesions or ulcers Psychiatry: Judgement and insight appear normal. Mood & affect appropriate.   Data Reviewed: I have personally reviewed following labs and imaging studies  CBC: Recent Labs  Lab 06/30/20 0354 07/01/20 0319 07/02/20 0505 07/04/20 0410 07/05/20 0508  WBC 6.7 6.9 7.1 7.3 6.7  HGB 12.1 11.9* 12.4 11.5* 11.3*  HCT 38.4 37.4 40.7 37.4 36.4  MCV 98.5 97.1 99.5 98.9 98.6  PLT 191 193 220 230 818   Basic Metabolic Panel: Recent Labs  Lab 06/30/20 0354 06/30/20 0354 07/01/20 0319 07/02/20 0505 07/03/20 0409 07/04/20 0410 07/05/20 0508  NA 135   < > 137 138 137 139 139  K 4.1   < > 3.5 3.9 3.3* 3.9 3.9  CL 90*   < > 90* 90* 90* 91* 89*  CO2 35*   < > 37* 38* 37* 39* 40*  GLUCOSE 123*   < > 233* 103* 128* 84 153*  BUN 33*   < > 30* 29* 32* 29* 35*  CREATININE 0.95   < > 0.81 0.94 0.98 0.86 1.01*  CALCIUM 8.8*   < > 8.4* 8.3* 9.0 9.1 9.2  MG 2.2  --  1.9 1.9 1.9 2.0  --    < > =  values in this interval not displayed.   GFR: Estimated Creatinine Clearance: 64.4 mL/min (A) (by C-G formula based on SCr of 1.01 mg/dL (H)). Liver Function Tests: No results for input(s): AST, ALT, ALKPHOS, BILITOT, PROT, ALBUMIN in the last 168 hours. No results for input(s): LIPASE, AMYLASE in the last 168 hours. No results for input(s): AMMONIA in the last 168 hours. Coagulation Profile: Recent Labs  Lab 07/01/20 0319 07/02/20 0505 07/03/20 0409 07/04/20 0410 07/05/20 0508  INR 2.4* 2.4* 2.2* 2.5*  2.5*   Cardiac Enzymes: No results for input(s): CKTOTAL, CKMB, CKMBINDEX, TROPONINI in the last 168 hours. BNP (last 3 results) No results for input(s): PROBNP in the last 8760 hours. HbA1C: No results for input(s): HGBA1C in the last 72 hours. CBG: Recent Labs  Lab 07/04/20 1150 07/04/20 1630 07/04/20 2045 07/05/20 0722 07/05/20 1129  GLUCAP 335* 246* 147* 132* 147*   Lipid Profile: No results for input(s): CHOL, HDL, LDLCALC, TRIG, CHOLHDL, LDLDIRECT in the last 72 hours. Thyroid Function Tests: No results for input(s): TSH, T4TOTAL, FREET4, T3FREE, THYROIDAB in the last 72 hours. Anemia Panel: No results for input(s): VITAMINB12, FOLATE, FERRITIN, TIBC, IRON, RETICCTPCT in the last 72 hours. Sepsis Labs: No results for input(s): PROCALCITON, LATICACIDVEN in the last 168 hours.  Recent Results (from the past 240 hour(s))  Respiratory Panel by RT PCR (Flu A&B, Covid) - Nasopharyngeal Swab     Status: None   Collection Time: 06/25/20  4:07 PM   Specimen: Nasopharyngeal Swab  Result Value Ref Range Status   SARS Coronavirus 2 by RT PCR NEGATIVE NEGATIVE Final    Comment: (NOTE) SARS-CoV-2 target nucleic acids are NOT DETECTED.  The SARS-CoV-2 RNA is generally detectable in upper respiratoy specimens during the acute phase of infection. The lowest concentration of SARS-CoV-2 viral copies this assay can detect is 131 copies/mL. A negative result does not preclude SARS-Cov-2 infection and should not be used as the sole basis for treatment or other patient management decisions. A negative result may occur with  improper specimen collection/handling, submission of specimen other than nasopharyngeal swab, presence of viral mutation(s) within the areas targeted by this assay, and inadequate number of viral copies (<131 copies/mL). A negative result must be combined with clinical observations, patient history, and epidemiological information. The expected result is  Negative.  Fact Sheet for Patients:  PinkCheek.be  Fact Sheet for Healthcare Providers:  GravelBags.it  This test is no t yet approved or cleared by the [place] FDA and  has been authorized for detection and/or diagnosis of SARS-CoV-2 by FDA under an Emergency Use Authorization (EUA). This EUA will remain  in effect (meaning this test can be used) for the duration of the COVID-19 declaration under Section 564(b)(1) of the Act, 21 U.S.C. section 360bbb-3(b)(1), unless the authorization is terminated or revoked sooner.     Influenza A by PCR NEGATIVE NEGATIVE Final   Influenza B by PCR NEGATIVE NEGATIVE Final    Comment: (NOTE) The Xpert Xpress SARS-CoV-2/FLU/RSV assay is intended as an aid in  the diagnosis of influenza from Nasopharyngeal swab specimens and  should not be used as a sole basis for treatment. Nasal washings and  aspirates are unacceptable for Xpert Xpress SARS-CoV-2/FLU/RSV  testing.  Fact Sheet for Patients: PinkCheek.be  Fact Sheet for Healthcare Providers: GravelBags.it  This test is not yet approved or cleared by the [place] FDA and  has been authorized for detection and/or diagnosis of SARS-CoV-2 by  FDA under an Emergency Use Authorization (  EUA). This EUA will remain  in effect (meaning this test can be used) for the duration of the  Covid-19 declaration under Section 564(b)(1) of the Act, 21  U.S.C. section 360bbb-3(b)(1), unless the authorization is  terminated or revoked. Performed at [facility], [address] [place], [place] [address]   MRSA PCR Screening     Status: None   Collection Time: 06/29/20  9:12 AM   Specimen: Nasal Mucosa; Nasopharyngeal  Result Value Ref Range Status   MRSA by PCR NEGATIVE NEGATIVE Final    Comment:        The GeneXpert MRSA Assay (FDA approved for NASAL specimens only), is one component  of a comprehensive MRSA colonization surveillance program. It is not intended to diagnose MRSA infection nor to guide or monitor treatment for MRSA infections. Performed at [facility], [address] [place], [place] [address]     Radiology Studies: No results found.  Scheduled Meds: . amiodarone  400 mg Oral BID  . bumetanide (BUMEX) IV  2 mg Intravenous Q12H  . Chlorhexidine Gluconate Cloth  6 each Topical Daily  . digoxin  0.25 mg Oral Daily  . gabapentin  900 mg Oral QHS  . insulin aspart  0-20 Units Subcutaneous TID WC  . insulin aspart  0-5 Units Subcutaneous QHS  . insulin aspart  4 Units Subcutaneous TID WC  . insulin glargine  25 Units Subcutaneous QHS  . metoprolol tartrate  25 mg Oral TID  . pantoprazole  40 mg Oral Daily  . potassium chloride  60 mEq Oral Daily  . sertraline  50 mg Oral QHS  . tamsulosin  0.4 mg Oral Daily  . warfarin  2.5 mg Oral ONCE-1600  . Warfarin - Pharmacist Dosing Inpatient   Does not apply q1600   Continuous Infusions:   LOS: 10 days   Irwin Brakeman, MD [facility]  If 7PM-7AM, please contact night-coverage [email or web address] 07/05/2020, 11:54 AM

## 2020-07-05 NOTE — Progress Notes (Signed)
ANTICOAGULATION CONSULT NOTE -   Pharmacy Consult for warfarin Indication: lupus anticoagulant and hx VTE  Patient Measurements: Height: 4\' 10"  (147.3 cm) Weight: 98.8 kg (217 lb 13 oz) IBW/kg (Calculated) : 40.9  Vital Signs: Temp: 98.9 F (37.2 C) (10/30 0455) Temp Source: Oral (10/30 0455) BP: 113/69 (10/30 0455) Pulse Rate: 59 (10/30 0455)  Labs: Recent Labs    07/03/20 0409 07/04/20 0410 07/05/20 0508  HGB  --  11.5* 11.3*  HCT  --  37.4 36.4  PLT  --  230 251  LABPROT 23.8* 26.2* 25.9*  INR 2.2* 2.5* 2.5*  CREATININE 0.98 0.86 1.01*    Estimated Creatinine Clearance: 64.4 mL/min (A) (by C-G formula based on SCr of 1.01 mg/dL (H)).  Assessment: 78 yof who presents to the ED with increasing SOB and bilateral leg swelling. She was recently discharged from the hospital on 10/15. She takes warfarin PTA for lupus anticoagulant and hx VTE. Pharmacy consulted to dose warfarin while inpatient.     Home dose:  2.5 mg daily except 5 mg Mon-Wed-Fri  Drug Interactions: amiodarone (major)-->use caution  w/dosing   07/04/20 update: INR: 2.5->within goal range  CBC: Hb 11.3      Plates: 251 RN reports no signs or symptoms of bleeding at this time    Goal of Therapy:  INR 2-3 Monitor platelets by anticoagulation protocol: Yes   Plan:  Give warfarin 2. 5 mg po x1 dose today (50% less than  usual home due to possible amiodarone interaction) Daily INR and every other day CBC Monitor for signs and symptoms  of bleeding  Thank you allowing  pharmacy to participate in this patient's care.  Margot Ables, PharmD Clinical Pharmacist 07/05/2020 8:40 AM

## 2020-07-05 NOTE — Plan of Care (Signed)

## 2020-07-06 DIAGNOSIS — I5033 Acute on chronic diastolic (congestive) heart failure: Secondary | ICD-10-CM | POA: Diagnosis not present

## 2020-07-06 DIAGNOSIS — I4892 Unspecified atrial flutter: Secondary | ICD-10-CM | POA: Diagnosis not present

## 2020-07-06 LAB — PROTIME-INR
INR: 2.5 — ABNORMAL HIGH (ref 0.8–1.2)
Prothrombin Time: 26.4 seconds — ABNORMAL HIGH (ref 11.4–15.2)

## 2020-07-06 LAB — BASIC METABOLIC PANEL
Anion gap: 8 (ref 5–15)
BUN: 33 mg/dL — ABNORMAL HIGH (ref 6–20)
CO2: 38 mmol/L — ABNORMAL HIGH (ref 22–32)
Calcium: 8.8 mg/dL — ABNORMAL LOW (ref 8.9–10.3)
Chloride: 92 mmol/L — ABNORMAL LOW (ref 98–111)
Creatinine, Ser: 1.01 mg/dL — ABNORMAL HIGH (ref 0.44–1.00)
GFR, Estimated: >60 mL/min (ref >60)
Glucose, Bld: 130 mg/dL — ABNORMAL HIGH (ref 70–99)
Potassium: 4.3 mmol/L (ref 3.5–5.1)
Sodium: 138 mmol/L (ref 135–145)

## 2020-07-06 LAB — GLUCOSE, CAPILLARY
Glucose-Capillary: 121 mg/dL — ABNORMAL HIGH (ref 70–99)
Glucose-Capillary: 206 mg/dL — ABNORMAL HIGH (ref 70–99)
Glucose-Capillary: 241 mg/dL — ABNORMAL HIGH (ref 70–99)
Glucose-Capillary: 256 mg/dL — ABNORMAL HIGH (ref 70–99)
Glucose-Capillary: 271 mg/dL — ABNORMAL HIGH (ref 70–99)
Glucose-Capillary: 348 mg/dL — ABNORMAL HIGH (ref 70–99)

## 2020-07-06 LAB — MAGNESIUM: Magnesium: 2.1 mg/dL (ref 1.7–2.4)

## 2020-07-06 MED ORDER — DOXYCYCLINE HYCLATE 100 MG PO TABS
100.0000 mg | ORAL_TABLET | Freq: Two times a day (BID) | ORAL | Status: AC
  Administered 2020-07-06 – 2020-07-07 (×3): 100 mg via ORAL
  Filled 2020-07-06 (×3): qty 1

## 2020-07-06 MED ORDER — INSULIN ASPART 100 UNIT/ML ~~LOC~~ SOLN
6.0000 [IU] | Freq: Three times a day (TID) | SUBCUTANEOUS | Status: DC
  Administered 2020-07-06 – 2020-07-07 (×4): 6 [IU] via SUBCUTANEOUS

## 2020-07-06 MED ORDER — WARFARIN SODIUM 2.5 MG PO TABS
2.5000 mg | ORAL_TABLET | Freq: Once | ORAL | Status: AC
  Administered 2020-07-06: 2.5 mg via ORAL
  Filled 2020-07-06: qty 1

## 2020-07-06 MED ORDER — BUMETANIDE 0.25 MG/ML IJ SOLN
2.0000 mg | Freq: Two times a day (BID) | INTRAMUSCULAR | Status: DC
  Administered 2020-07-06: 2 mg via INTRAVENOUS
  Filled 2020-07-06 (×7): qty 8

## 2020-07-06 NOTE — Plan of Care (Signed)
  Problem: Education: Goal: Knowledge of General Education information will improve Description Including pain rating scale, medication(s)/side effects and non-pharmacologic comfort measures Outcome: Progressing   Problem: Health Behavior/Discharge Planning: Goal: Ability to manage health-related needs will improve Outcome: Progressing   

## 2020-07-06 NOTE — Plan of Care (Signed)
  Problem: Education: °Goal: Knowledge of General Education information will improve °Description: Including pain rating scale, medication(s)/side effects and non-pharmacologic comfort measures °Outcome: Progressing °  °Problem: Clinical Measurements: °Goal: Respiratory complications will improve °Outcome: Progressing °  °Problem: Elimination: °Goal: Will not experience complications related to urinary retention °Outcome: Progressing °  °

## 2020-07-06 NOTE — Progress Notes (Addendum)
PROGRESS NOTE    Theresa [name]  TUU:[id number] DOB: 1965-10-05 DOA: 06/25/2020 PCP: Monico Blitz, MD   Brief Narrative:  Per HPI: Theresa [name] a 54 y.o.femalewith medical history significant forchronic respiratory failure on 4 L, systolic and diastolic CHF, coronary artery disease, DVT, diabetes mellitus, hypertension, psoriasis. Patient was seen today by advanced home care nurse.It was found that patient had gained 14 pounds since she was recently discharged from the hospital, patient was also having increasing difficulty breathing, with bilateral lower extremity swelling and bloating. Patient was referred to the ED. Patient reports compliance with her torsemide40mg  BID,and denies dietary indiscretion. Patient has a trach. She reports productive cough, wheezing since yesterday. No chest pain.  Recent hospitalization 10/9-10/15,for severe sepsis due to E. coli bacteremia,cholecystitis,lactic acid peaked at 2.8, she was treated with ceftriaxone and metronidazole, and transitioned over to Augmentin on discharge. General surgery recommended nonoperative management cholecystitis, as patient is not a good surgical candidate. She was also managed for acute on chronic hypoxic respiratory failure, and decompensated CHF,COPD exacerbation,requiring 10 L of oxygen,she was discharged on her baseline 4 L.  -Patient admitted with acute on chronic combined diastolic and systolic heart failure with associated acute on chronic hypoxemic respiratory failure.  She was having difficulty with diuresis on IV Lasix and doses were being increased up to IV 80 mg 3 times daily.  Unfortunately on 10/24 she developed new onset atrial flutter with RVR and was transferred to stepdown unit and started on amiodarone drip on account of soft blood pressure readings.  She continued to have elevated heart rate readings for which repeat bolus and digoxin were given on 10/25 with improvement in blood pressure  readings.  She is now starting to diurese better with IV Bumex and heart rates improved on IV amiodarone.  Assessment & Plan:   Principal Problem:   Acute on chronic combined systolic and diastolic HF (heart failure) (HCC) Active Problems:   DM (diabetes mellitus) ([place])   Essential hypertension   OSA (obstructive sleep apnea)   Lupus anticoagulant syndrome (HCC)   CAD (coronary artery disease) of artery bypass graft: PTCA/DES to distal body VG to PDA 10/29/15   Tracheostomy in place [facility]), chronic since 2002   Acute on chronic respiratory failure with hypoxia ([place])   Uncontrolled type 2 diabetes mellitus with hyperglycemia, with long-term current use of insulin (HCC)   Acute on chronic respiratory failure with hypoxia (HCC)   Chronic respiratory insufficiency   DVT (deep venous thrombosis) (HCC)   Atrial flutter with rapid ventricular response (HCC)   Hypoxemia  Acute on chronic hypoxemic respiratory failure secondary to acute on chronic combined diastolic and systolic heart failure -down to 4 L oxygen via trach collar.  She wears 4 L total at baseline. -She reports compliance with 40 mg torsemide twice daily at home -Continue diuresis with Bumex reduced to once daily with bump in BUN/Cr. Recheck in AM.  -Follow daily BMP, daily weights, strict I's and O's -Appreciate cardiology evaluation -Holding home losartan and metoprolol with holding parameters due to softening blood pressure readings.  New onset atrial flutter with RVR -Pt now on oral amiodarone taper, digoxin and metoprolol -Appreciate cardiology recommendations for further management -TSH 4.4 -Patient anticoagulated with warfarin (INR therapeutic)  Elevated troponin secondary to above in setting of CAD -Likely some mild demand ischemia with flat trend noted and no chest pain  Hypokalemia  -Repleted with oral potassium  Mild Cellulitis left forearm - area where IV had been removed now  infected, apply warm compresses,  doxycycline 100 mg BID x 3 days.   COPD -No acute bronchospasms currently noted -Continue mucolytics -Duo nebs to Xopenex as needed for shortness of breath or wheezing -Flutter valve  Lupus anticoagulant syndrome/history of DVT on chronic anticoagulation -Continue warfarin with pharmacy to dose, follow daily PT/INR  History of CABG -No chest pain currently noted -Continue warfarin  Uncontrolled type 2 diabetes likely related to steroid-induced hyperglycemia, improving -Continue SSI on aggressive scale while holding Ozempic -Continue home lantus 25 units plus prandial novolog with meals. Increase prandial novolog to 6 units -continue to monitor CBGs closely.    Depression -Continue home sertraline and lorazepam  DVT prophylaxis:Warfarin Code Status:Full code Family Communication:sister phone 10/26, T/C to spouse 10/30 updated Disposition Plan:Home  Status is: Inpatient  Remains inpatient appropriate because:IV treatments appropriate due to intensity of illness or inability to take PO and Inpatient level of care appropriate due to severity of illness  Dispo: The patient is from:Home Anticipated d/c is OA:[facility] Anticipated d/c date is: 1-2days Patient currently is not medically stable to d/c.She requires ongoing diuresis and inpatient managementfor heart rate control.  Cardiology now planning for outpatient TEE cardioversion after 3 weeks of full anticoagulation.  Further recommendations to follow.   Consultants:  Cardiology  Procedures:  See below  Antimicrobials:   None  Subjective: Patient has area in left forearm where IV was removed that is warm and red and some fluctuance and tenderness, she is really wanting to get home.      Objective: Vitals:   07/05/20 2214 07/06/20 0629 07/06/20 0741 07/06/20 0943  BP: 107/65 103/60    Pulse: 63 70  71  Resp:  18    Temp: 97.8 F (36.6 C) (!) 97.5 F (36.4  C)    TempSrc: Oral Oral    SpO2: 99% 98% 97%   Weight:      Height:        Intake/Output Summary (Last 24 hours) at 07/06/2020 1120 Last data filed at 07/06/2020 0951 Gross per 24 hour  Intake 480 ml  Output --  Net 480 ml   Filed Weights   06/30/20 0500 07/04/20 0500 07/05/20 0500  Weight: 97 kg 96.2 kg 98.8 kg   Examination:  General exam: chronically ill appearing, awake, alert, NAD.  Respiratory system:rare basilar crackles. No increased work of breathing.  Tracheostomy with trach collar at 4L in place. Cardiovascular system: normal S1 & S2 heard, irregularly irregular, but rate controlled Gastrointestinal system: Abdomen is nondistended, soft and nontender.  Central nervous system: Alert and awake Extremities: small area of infection left forearm with cellulitis, small fluctuant area, tender to palpate, bilateral lower extremity edema slightly improved Skin: No rashes, lesions or ulcers Psychiatry: Judgement and insight appear normal. Mood & affect appropriate.   Data Reviewed: I have personally reviewed following labs and imaging studies  CBC: Recent Labs  Lab 06/30/20 0354 07/01/20 0319 07/02/20 0505 07/04/20 0410 07/05/20 0508  WBC 6.7 6.9 7.1 7.3 6.7  HGB 12.1 11.9* 12.4 11.5* 11.3*  HCT 38.4 37.4 40.7 37.4 36.4  MCV 98.5 97.1 99.5 98.9 98.6  PLT 191 193 220 230 063   Basic Metabolic Panel: Recent Labs  Lab 07/01/20 0319 07/01/20 0319 07/02/20 0505 07/03/20 0409 07/04/20 0410 07/05/20 0508 07/06/20 0646  NA 137   < > 138 137 139 139 138  K 3.5   < > 3.9 3.3* 3.9 3.9 4.3  CL 90*   < > 90* 90* 91* 89*  92*  CO2 37*   < > 38* 37* 39* 40* 38*  GLUCOSE 233*   < > 103* 128* 84 153* 130*  BUN 30*   < > 29* 32* 29* 35* 33*  CREATININE 0.81   < > 0.94 0.98 0.86 1.01* 1.01*  CALCIUM 8.4*   < > 8.3* 9.0 9.1 9.2 8.8*  MG 1.9  --  1.9 1.9 2.0  --  2.1   < > = values in this interval not displayed.   GFR: Estimated Creatinine Clearance: 64.4 mL/min (A)  (by C-G formula based on SCr of 1.01 mg/dL (H)). Liver Function Tests: No results for input(s): AST, ALT, ALKPHOS, BILITOT, PROT, ALBUMIN in the last 168 hours. No results for input(s): LIPASE, AMYLASE in the last 168 hours. No results for input(s): AMMONIA in the last 168 hours. Coagulation Profile: Recent Labs  Lab 07/02/20 0505 07/03/20 0409 07/04/20 0410 07/05/20 0508 07/06/20 0646  INR 2.4* 2.2* 2.5* 2.5* 2.5*   Cardiac Enzymes: No results for input(s): CKTOTAL, CKMB, CKMBINDEX, TROPONINI in the last 168 hours. BNP (last 3 results) No results for input(s): PROBNP in the last 8760 hours. HbA1C: No results for input(s): HGBA1C in the last 72 hours. CBG: Recent Labs  Lab 07/05/20 1607 07/05/20 2235 07/06/20 0030 07/06/20 0722 07/06/20 1103  GLUCAP 147* 409* 348* 121* 241*   Lipid Profile: No results for input(s): CHOL, HDL, LDLCALC, TRIG, CHOLHDL, LDLDIRECT in the last 72 hours. Thyroid Function Tests: No results for input(s): TSH, T4TOTAL, FREET4, T3FREE, THYROIDAB in the last 72 hours. Anemia Panel: No results for input(s): VITAMINB12, FOLATE, FERRITIN, TIBC, IRON, RETICCTPCT in the last 72 hours. Sepsis Labs: No results for input(s): PROCALCITON, LATICACIDVEN in the last 168 hours.  Recent Results (from the past 240 hour(s))  MRSA PCR Screening     Status: None   Collection Time: 06/29/20  9:12 AM   Specimen: Nasal Mucosa; Nasopharyngeal  Result Value Ref Range Status   MRSA by PCR NEGATIVE NEGATIVE Final    Comment:        The GeneXpert MRSA Assay (FDA approved for NASAL specimens only), is one component of a comprehensive MRSA colonization surveillance program. It is not intended to diagnose MRSA infection nor to guide or monitor treatment for MRSA infections. Performed at [facility], [address] [place], [place] [address]     Radiology Studies: No results found.  Scheduled Meds: . amiodarone  400 mg Oral BID  . bumetanide (BUMEX) IV  2 mg  Intravenous Q12H  . Chlorhexidine Gluconate Cloth  6 each Topical Daily  . digoxin  0.25 mg Oral Daily  . gabapentin  900 mg Oral QHS  . insulin aspart  0-20 Units Subcutaneous TID WC  . insulin aspart  0-5 Units Subcutaneous QHS  . insulin aspart  6 Units Subcutaneous TID WC  . insulin glargine  25 Units Subcutaneous QHS  . metoprolol tartrate  25 mg Oral TID  . pantoprazole  40 mg Oral Daily  . potassium chloride  60 mEq Oral Daily  . sertraline  50 mg Oral QHS  . tamsulosin  0.4 mg Oral Daily  . warfarin  2.5 mg Oral ONCE-1600  . Warfarin - Pharmacist Dosing Inpatient   Does not apply q1600   Continuous Infusions:   LOS: 11 days   Irwin Brakeman, MD [facility]  If 7PM-7AM, please contact night-coverage [email or web address] 07/06/2020, 11:20 AM

## 2020-07-06 NOTE — Progress Notes (Signed)
ANTICOAGULATION CONSULT NOTE -   Pharmacy Consult for warfarin Indication: lupus anticoagulant and hx VTE  Patient Measurements: Height: 4\' 10"  (147.3 cm) Weight: 98.8 kg (217 lb 13 oz) IBW/kg (Calculated) : 40.9  Vital Signs: Temp: 97.5 F (36.4 C) (10/31 0629) Temp Source: Oral (10/31 0629) BP: 103/60 (10/31 0629) Pulse Rate: 70 (10/31 0629)  Labs: Recent Labs    07/04/20 0410 07/05/20 0508 07/06/20 0646  HGB 11.5* 11.3*  --   HCT 37.4 36.4  --   PLT 230 251  --   LABPROT 26.2* 25.9* 26.4*  INR 2.5* 2.5* 2.5*  CREATININE 0.86 1.01* 1.01*    Estimated Creatinine Clearance: 64.4 mL/min (A) (by C-G formula based on SCr of 1.01 mg/dL (H)).  Assessment: 2 yof who presents to the ED with increasing SOB and bilateral leg swelling. She was recently discharged from the hospital on 10/15. She takes warfarin PTA for lupus anticoagulant and hx VTE. Pharmacy consulted to dose warfarin while inpatient. With addition of amiodarone on 10/24 will likely interact with coumadin and dose amount will be less.   Home dose:  2.5 mg daily except 5 mg MWF 10/22: On 10/20 Coumadin 2.5mg  dose not given. So on 10/21 7.5mg  given.  INR is therapeutic at 2.5 today  Goal of Therapy:  INR 2-3 Monitor platelets by anticoagulation protocol: Yes   Plan:  Give warfarin 2.5 mg po x1 dose today Daily INR and every other day CBC Monitor for signs and symptoms  of bleeding  Thank you for involving pharmacy in this patient's care.  Margot Ables, PharmD Clinical Pharmacist 07/06/2020 8:25 AM

## 2020-07-07 DIAGNOSIS — I4892 Unspecified atrial flutter: Secondary | ICD-10-CM | POA: Diagnosis not present

## 2020-07-07 DIAGNOSIS — I25700 Atherosclerosis of coronary artery bypass graft(s), unspecified, with unstable angina pectoris: Secondary | ICD-10-CM | POA: Diagnosis not present

## 2020-07-07 DIAGNOSIS — I5043 Acute on chronic combined systolic (congestive) and diastolic (congestive) heart failure: Secondary | ICD-10-CM | POA: Diagnosis not present

## 2020-07-07 DIAGNOSIS — J9621 Acute and chronic respiratory failure with hypoxia: Secondary | ICD-10-CM | POA: Diagnosis not present

## 2020-07-07 LAB — BASIC METABOLIC PANEL
Anion gap: 8 (ref 5–15)
BUN: 29 mg/dL — ABNORMAL HIGH (ref 6–20)
CO2: 37 mmol/L — ABNORMAL HIGH (ref 22–32)
Calcium: 8.9 mg/dL (ref 8.9–10.3)
Chloride: 91 mmol/L — ABNORMAL LOW (ref 98–111)
Creatinine, Ser: 0.94 mg/dL (ref 0.44–1.00)
GFR, Estimated: >60 mL/min (ref >60)
Glucose, Bld: 196 mg/dL — ABNORMAL HIGH (ref 70–99)
Potassium: 4.4 mmol/L (ref 3.5–5.1)
Sodium: 136 mmol/L (ref 135–145)

## 2020-07-07 LAB — PROTIME-INR
INR: 2.8 — ABNORMAL HIGH (ref 0.8–1.2)
Prothrombin Time: 28.6 seconds — ABNORMAL HIGH (ref 11.4–15.2)

## 2020-07-07 LAB — GLUCOSE, CAPILLARY
Glucose-Capillary: 148 mg/dL — ABNORMAL HIGH (ref 70–99)
Glucose-Capillary: 121 mg/dL — ABNORMAL HIGH (ref 70–99)

## 2020-07-07 LAB — MAGNESIUM: Magnesium: 1.9 mg/dL (ref 1.7–2.4)

## 2020-07-07 MED ORDER — BUMETANIDE 2 MG PO TABS
2.0000 mg | ORAL_TABLET | Freq: Two times a day (BID) | ORAL | 1 refills | Status: DC
Start: 2020-07-07 — End: 2020-07-21

## 2020-07-07 MED ORDER — DIGOXIN 125 MCG PO TABS
0.1250 mg | ORAL_TABLET | Freq: Every day | ORAL | 1 refills | Status: DC
Start: 2020-07-08 — End: 2020-07-29

## 2020-07-07 MED ORDER — POTASSIUM CHLORIDE CRYS ER 20 MEQ PO TBCR: 40.0000 meq | EXTENDED_RELEASE_TABLET | Freq: Every day | ORAL | 0 refills | Status: DC

## 2020-07-07 MED ORDER — METOPROLOL TARTRATE 25 MG PO TABS
25.0000 mg | ORAL_TABLET | Freq: Two times a day (BID) | ORAL | Status: DC
  Administered 2020-07-07: 25 mg via ORAL

## 2020-07-07 MED ORDER — ALBUTEROL SULFATE (2.5 MG/3ML) 0.083% IN NEBU: 2.5000 mg | INHALATION_SOLUTION | RESPIRATORY_TRACT | Status: AC | PRN

## 2020-07-07 MED ORDER — WARFARIN SODIUM 2.5 MG PO TABS
2.5000 mg | ORAL_TABLET | ORAL | Status: DC
Start: 2020-07-07 — End: 2020-08-07

## 2020-07-07 MED ORDER — GABAPENTIN 300 MG PO CAPS: 900.0000 mg | ORAL_CAPSULE | Freq: Every day | ORAL | Status: AC

## 2020-07-07 MED ORDER — AMIODARONE HCL 200 MG PO TABS
ORAL_TABLET | ORAL | 0 refills | Status: DC
Start: 2020-07-07 — End: 2020-07-29

## 2020-07-07 MED ORDER — TRESIBA FLEXTOUCH 100 UNIT/ML ~~LOC~~ SOPN: 30.0000 [IU] | PEN_INJECTOR | Freq: Every day | SUBCUTANEOUS | Status: AC

## 2020-07-07 MED ORDER — BUMETANIDE 1 MG PO TABS: 2.0000 mg | ORAL_TABLET | Freq: Two times a day (BID) | ORAL | Status: DC

## 2020-07-07 MED ORDER — METOPROLOL TARTRATE 25 MG PO TABS
25.0000 mg | ORAL_TABLET | Freq: Two times a day (BID) | ORAL | 1 refills | Status: DC
Start: 2020-07-07 — End: 2020-07-29

## 2020-07-07 MED ORDER — DIGOXIN 125 MCG PO TABS
0.1250 mg | ORAL_TABLET | Freq: Every day | ORAL | Status: DC
  Administered 2020-07-07: 0.125 mg via ORAL

## 2020-07-07 MED ORDER — WARFARIN SODIUM 2.5 MG PO TABS: 2.5000 mg | ORAL_TABLET | Freq: Once | ORAL | Status: DC

## 2020-07-07 NOTE — Care Management Important Message (Signed)
Important Message  Patient Details  Name: Theresa [name] MRN: [id number] Date of Birth: 16-Mar-1966   Medicare Important Message Given:  Yes     [name] 07/07/2020, 11:25 AM

## 2020-07-07 NOTE — Discharge Summary (Signed)
Physician Discharge Summary  Theresa [name] JME:[id number] DOB: 08-22-1966 DOA: 06/25/2020  PCP: Monico Blitz, MD  Admit date: 06/25/2020 Discharge date: 07/07/2020  Admitted From:  Home  Disposition: Home with [facility]  Recommendations for Outpatient Follow-up:  1. Follow up with PCP in 1 weeks 2. Follow up with cardiology as scheduled in next 1-2 weeks  Home Health:  RN, Aide   Discharge Condition: STABLE   CODE STATUS: FULL    Brief Hospitalization Summary: Please see all hospital notes, images, labs for full details of the hospitalization. ADMISSION HPI:  54 y.o. female with medical history significant for chronic respiratory failure on 4 L, systolic and diastolic CHF, coronary artery disease, DVT, diabetes mellitus, hypertension, psoriasis. Patient was seen today by advanced home care nurse.  It was found that patient had gained 14 pounds since she was recently discharged from the hospital, patient was also having increasing difficulty breathing, with bilateral lower extremity swelling and bloating.  Patient was referred to the ED. Patient reports compliance with her torsemide 40mg  BID, and denies dietary indiscretion. Patient has a trach.  She reports productive cough, wheezing since yesterday.  No chest pain.  Recent hospitalization 10/9-10/15, for severe sepsis due to E. coli bacteremia, cholecystitis, lactic acid peaked at 2.8, she was treated with ceftriaxone and metronidazole, and transitioned over to Augmentin on discharge.  General surgery recommended nonoperative management cholecystitis, as patient is not a good surgical candidate.  She was also managed for acute on chronic hypoxic respiratory failure, and decompensated CHF, COPD exacerbation, requiring 10 L of oxygen, she was discharged on her baseline 4 L.  ED Course: O2 sats greater than 94% on 4 L nasal cannula, blood pressure 110s to 130s, BNP 536, creatinine 0.78 at baseline.  Glucose sodium 473.  EKG sinus rhythm without  acute abnormality.  Covid test negative.  Portable chest x-ray showed moderate to marked severe interstitial edema and a chronic interstitial lung disease.  80 mg of Lasix was given hospitalist to admit for decompensated CHF.  Acute on chronic hypoxemic respiratory failure secondary to acute on chronic combined diastolic and systolic heart failure -down to 4 L oxygen via trach collar.  She wears 4 L total at baseline. -Continue diuresis with Bumex 2 mg po BID per cardiology recommendations.  -Followed daily BMP, daily weights, strict I's and O's -Appreciate cardiology evaluation  New onset atrial flutterwith RVR -Pt now on oral amiodarone taper, digoxin and metoprolol -Appreciate cardiology recommendations for further management -TSH 4.4 -Patient anticoagulated with warfarin (INR therapeutic) -Per cardiology: Pt needs to complete amiodarone 400 mg BID x 4 days, then 200 mg BID x 14 days, then 200 mg daily.  Digoxin 0.125 mg daily and lopressor 25 mg BID  Elevated troponin secondary to above in setting of CAD -Likely some mild demand ischemia with flat trend noted and no chest pain  Hypokalemia  -Repleted with oral potassium  Mild Cellulitis left forearm - area where IV had been removed now infected, apply warm compresses, doxycycline 100 mg BID x 3 doses.   COPD -No acute bronchospasms currently noted -Continue mucolytics -Duo nebs to Xopenex as needed for shortness of breath or wheezing -Flutter valve  Lupus anticoagulant syndrome/history of DVT on chronic anticoagulation -Continue warfarin with pharmacy to dose, follow daily PT/INR  History of CABG -No chest pain currently noted -Continue warfarin  Uncontrolled type 2 diabetes likely related to steroid-induced hyperglycemia, improving -resume home treatment program with frequent monitoring of CBG    Depression -Continue home  sertraline and lorazepam  DVT prophylaxis:Warfarin Code Status:Full code Family  Communication:sister phone 10/26, T/C to spouse 10/30 updated Disposition Plan:Home  Status is: Inpatient  Discharge Diagnoses:  Principal Problem:   Acute on chronic combined systolic and diastolic HF (heart failure) (HCC) Active Problems:   DM (diabetes mellitus) ([place])   Essential hypertension   OSA (obstructive sleep apnea)   Lupus anticoagulant syndrome (HCC)   CAD (coronary artery disease) of artery bypass graft: PTCA/DES to distal body VG to PDA 10/29/15   Tracheostomy in place [facility] [address]), chronic since 2002   Acute on chronic respiratory failure with hypoxia ([place])   Uncontrolled type 2 diabetes mellitus with hyperglycemia, with long-term current use of insulin (HCC)   Acute on chronic respiratory failure with hypoxia (HCC)   Chronic respiratory insufficiency   DVT (deep venous thrombosis) (HCC)   Atrial flutter with rapid ventricular response (HCC)   Hypoxemia  Discharge Instructions:  Allergies as of 07/07/2020      Reactions   Penicillins Rash   Has patient had a PCN reaction causing immediate rash, facial/tongue/throat swelling, SOB or lightheadedness with hypotension: Yes Has patient had a PCN reaction causing severe rash involving mucus membranes or skin necrosis: No Has patient had a PCN reaction that required hospitalization No Has patient had a PCN reaction occurring within the last 10 years: No If all of the above answers are "NO", then may proceed with Cephalosporin use. REACTION: rash Pt has tolerated cefepime in the past.   Ciprofloxacin    nausea   Ibuprofen Rash   swelling in leg      Medication List    STOP taking these medications   losartan 25 MG tablet Commonly known as: COZAAR   metoprolol succinate 50 MG 24 hr tablet Commonly known as: TOPROL-XL   predniSONE 10 MG tablet Commonly known as: DELTASONE   torsemide 20 MG tablet Commonly known as: DEMADEX     TAKE these medications   albuterol 108 (90 Base) MCG/ACT inhaler Commonly known as:  VENTOLIN HFA Inhale 2 puffs into the lungs every 4 (four) hours as needed for wheezing or shortness of breath.   albuterol (2.5 MG/3ML) 0.083% nebulizer solution Commonly known as: PROVENTIL Take 3 mLs (2.5 mg total) by nebulization every 4 (four) hours as needed for wheezing.   amiodarone 200 MG tablet Commonly known as: PACERONE Take 2 tabs po BID x 4 days, then 1 tab BID x 14 days, then 1 po daily   bumetanide 2 MG tablet Commonly known as: BUMEX Take 1 tablet (2 mg total) by mouth 2 (two) times daily.   cephALEXin 500 MG capsule Commonly known as: KEFLEX Take 1 capsule (500 mg total) by mouth every 8 (eight) hours.   digoxin 0.125 MG tablet Commonly known as: LANOXIN Take 1 tablet (0.125 mg total) by mouth daily. Start taking on: July 08, 2020   gabapentin 300 MG capsule Commonly known as: NEURONTIN Take 3 capsules (900 mg total) by mouth at bedtime.   LORazepam 0.5 MG tablet Commonly known as: ATIVAN Take 0.5 mg by mouth every 6 (six) hours as needed for anxiety.   metoprolol tartrate 25 MG tablet Commonly known as: LOPRESSOR Take 1 tablet (25 mg total) by mouth 2 (two) times daily.   mometasone 0.1 % ointment Commonly known as: ELOCON Apply 1 application topically daily as needed.   nitroGLYCERIN 0.4 MG SL tablet Commonly known as: NITROSTAT Place 0.4 mg under the tongue every 5 (five) minutes as needed for chest  pain.   NovoLOG FlexPen 100 UNIT/ML FlexPen Generic drug: insulin aspart Inject 1-10 Units into the skin 3 (three) times daily before meals.   Ozempic (0.25 or 0.5 MG/DOSE) 2 MG/1.5ML Sopn Generic drug: Semaglutide(0.25 or 0.5MG /DOS) Inject 0.5 mg into the skin every Wednesday.   pantoprazole 20 MG tablet Commonly known as: PROTONIX Take 20 mg by mouth daily.   polyethylene glycol 17 g packet Commonly known as: MIRALAX / GLYCOLAX Take 17 g by mouth daily as needed for mild constipation.   potassium chloride SA 20 MEQ tablet Commonly known  as: KLOR-CON Take 2 tablets (40 mEq total) by mouth daily. What changed: how much to take   sertraline 50 MG tablet Commonly known as: ZOLOFT Take 50 mg by mouth at bedtime.   tamsulosin 0.4 MG Caps capsule Commonly known as: FLOMAX Take 0.4 mg by mouth daily.   traMADol 50 MG tablet Commonly known as: ULTRAM Take 50 mg by mouth 3 (three) times daily as needed for moderate pain.   Tyler Aas FlexTouch 100 UNIT/ML FlexTouch Pen Generic drug: insulin degludec Inject 30 Units into the skin at bedtime. What changed: how much to take   warfarin 2.5 MG tablet Commonly known as: COUMADIN Take 1-2 tablets (2.5-5 mg total) by mouth See admin instructions. Mon,Wed,Fri take 5mg   2.5mg  all other days       Follow-up Information    Monico Blitz, MD. Schedule an appointment as soon as possible for a visit on 07/11/2020.   Specialty: Internal Medicine Why: 2:15 Contact information: [place] [address] [phone]        Deberah Pelton, NP. Go on 07/21/2020.   Specialty: Cardiology Why: as scheduled with cardiology  Contact information: [address] [place] [address] [phone]              Allergies  Allergen Reactions  . Penicillins Rash    Has patient had a PCN reaction causing immediate rash, facial/tongue/throat swelling, SOB or lightheadedness with hypotension: Yes Has patient had a PCN reaction causing severe rash involving mucus membranes or skin necrosis: No Has patient had a PCN reaction that required hospitalization No Has patient had a PCN reaction occurring within the last 10 years: No If all of the above answers are "NO", then may proceed with Cephalosporin use.   REACTION: rash Pt has tolerated cefepime in the past.  . Ciprofloxacin     nausea  . Ibuprofen Rash    swelling in leg    Allergies as of 07/07/2020      Reactions   Penicillins Rash   Has patient had a PCN reaction causing immediate rash,  facial/tongue/throat swelling, SOB or lightheadedness with hypotension: Yes Has patient had a PCN reaction causing severe rash involving mucus membranes or skin necrosis: No Has patient had a PCN reaction that required hospitalization No Has patient had a PCN reaction occurring within the last 10 years: No If all of the above answers are "NO", then may proceed with Cephalosporin use. REACTION: rash Pt has tolerated cefepime in the past.   Ciprofloxacin    nausea   Ibuprofen Rash   swelling in leg      Medication List    STOP taking these medications   losartan 25 MG tablet Commonly known as: COZAAR   metoprolol succinate 50 MG 24 hr tablet Commonly known as: TOPROL-XL   predniSONE 10 MG tablet Commonly known as: DELTASONE   torsemide 20 MG tablet Commonly known as: DEMADEX  TAKE these medications   albuterol 108 (90 Base) MCG/ACT inhaler Commonly known as: VENTOLIN HFA Inhale 2 puffs into the lungs every 4 (four) hours as needed for wheezing or shortness of breath.   albuterol (2.5 MG/3ML) 0.083% nebulizer solution Commonly known as: PROVENTIL Take 3 mLs (2.5 mg total) by nebulization every 4 (four) hours as needed for wheezing.   amiodarone 200 MG tablet Commonly known as: PACERONE Take 2 tabs po BID x 4 days, then 1 tab BID x 14 days, then 1 po daily   bumetanide 2 MG tablet Commonly known as: BUMEX Take 1 tablet (2 mg total) by mouth 2 (two) times daily.   cephALEXin 500 MG capsule Commonly known as: KEFLEX Take 1 capsule (500 mg total) by mouth every 8 (eight) hours.   digoxin 0.125 MG tablet Commonly known as: LANOXIN Take 1 tablet (0.125 mg total) by mouth daily. Start taking on: July 08, 2020   gabapentin 300 MG capsule Commonly known as: NEURONTIN Take 3 capsules (900 mg total) by mouth at bedtime.   LORazepam 0.5 MG tablet Commonly known as: ATIVAN Take 0.5 mg by mouth every 6 (six) hours as needed for anxiety.   metoprolol tartrate 25 MG  tablet Commonly known as: LOPRESSOR Take 1 tablet (25 mg total) by mouth 2 (two) times daily.   mometasone 0.1 % ointment Commonly known as: ELOCON Apply 1 application topically daily as needed.   nitroGLYCERIN 0.4 MG SL tablet Commonly known as: NITROSTAT Place 0.4 mg under the tongue every 5 (five) minutes as needed for chest pain.   NovoLOG FlexPen 100 UNIT/ML FlexPen Generic drug: insulin aspart Inject 1-10 Units into the skin 3 (three) times daily before meals.   Ozempic (0.25 or 0.5 MG/DOSE) 2 MG/1.5ML Sopn Generic drug: Semaglutide(0.25 or 0.5MG /DOS) Inject 0.5 mg into the skin every Wednesday.   pantoprazole 20 MG tablet Commonly known as: PROTONIX Take 20 mg by mouth daily.   polyethylene glycol 17 g packet Commonly known as: MIRALAX / GLYCOLAX Take 17 g by mouth daily as needed for mild constipation.   potassium chloride SA 20 MEQ tablet Commonly known as: KLOR-CON Take 2 tablets (40 mEq total) by mouth daily. What changed: how much to take   sertraline 50 MG tablet Commonly known as: ZOLOFT Take 50 mg by mouth at bedtime.   tamsulosin 0.4 MG Caps capsule Commonly known as: FLOMAX Take 0.4 mg by mouth daily.   traMADol 50 MG tablet Commonly known as: ULTRAM Take 50 mg by mouth 3 (three) times daily as needed for moderate pain.   Tyler Aas FlexTouch 100 UNIT/ML FlexTouch Pen Generic drug: insulin degludec Inject 30 Units into the skin at bedtime. What changed: how much to take   warfarin 2.5 MG tablet Commonly known as: COUMADIN Take 1-2 tablets (2.5-5 mg total) by mouth See admin instructions. Mon,Wed,Fri take 5mg   2.5mg  all other days       Procedures/Studies: DG Chest 1 View  Result Date: 06/28/2020 CLINICAL DATA:  Hypoxemia EXAM: CHEST  1 VIEW COMPARISON:  06/25/2020 FINDINGS: Tracheostomy tube is noted in satisfactory position. Cardiac shadow is mildly enlarged but accentuated by the portable technique. Vascular congestion with interstitial  opacities are again identified but slightly increased from the prior study. Postsurgical changes in the right base are again seen. No bony abnormality is noted. IMPRESSION: Vascular congestion increased interstitial markings consistent with edema. Electronically Signed   By: Inez Catalina M.D.   On: 06/28/2020 16:19   CT ABDOMEN PELVIS  W CONTRAST  Result Date: 06/15/2020 CLINICAL DATA:  Acute abdominal pain, nonlocalized.  Cholelithiasis. EXAM: CT ABDOMEN AND PELVIS WITH CONTRAST TECHNIQUE: Multidetector CT imaging of the abdomen and pelvis was performed using the standard protocol following bolus administration of intravenous contrast. CONTRAST:  168mL OMNIPAQUE IOHEXOL 300 MG/ML  SOLN COMPARISON:  04/23/2019 FINDINGS: Lower chest: The visualized lung bases demonstrates interlobular septal thickening and ground-glass pulmonary infiltrate likely representing pulmonary edema, possibly cardiogenic in nature. This appears improved since prior CT examination of 04/18/2020. Coronary artery stenting has been performed. Cardiac size is mildly enlarged. No pericardial effusion. Hepatobiliary: Cholelithiasis. No pericholecystic inflammatory change identified. Liver unremarkable. No intra or extrahepatic biliary ductal dilation. Pancreas: Unremarkable Spleen: Unremarkable Adrenals/Urinary Tract: Multiple bilateral adrenal nodules are unchanged measuring up to 2.5 cm within the left adrenal gland and compatible with multiple adenoma, better evaluated on prior examination. Kidneys are unremarkable. Bladder is unremarkable. Stomach/Bowel: Stomach, small bowel, and large bowel are unremarkable. Appendix normal. No free intraperitoneal gas or fluid. Vascular/Lymphatic: Extensive aortoiliac atherosclerotic calcification is present. No aortic aneurysm. No pathologic adenopathy within the abdomen and pelvis. Reproductive: Uterus and bilateral adnexa are unremarkable. Other: Rectum unremarkable Musculoskeletal: No acute bone  abnormality. Remote compression fracture of T9 is unchanged. Superior endplate fracture of L3 is unchanged. IMPRESSION: Bibasilar pulmonary infiltrates and septal thickening in keeping with mild to moderate pulmonary edema, possibly cardiogenic in nature. This appears improved. Cholelithiasis without CT evidence of acute cholecystitis. Multiple stable adrenal adenoma. Biochemical correlation may be helpful to assess for hyperfunctioning. Aortic Atherosclerosis (ICD10-I70.0). Electronically Signed   By: Fidela Salisbury MD   On: 06/15/2020 04:03   MR 3D Recon At Scanner  Result Date: 06/16/2020 CLINICAL DATA:  Evaluate for gallstones.  Elevated bilirubin levels. EXAM: MRI ABDOMEN WITHOUT AND WITH CONTRAST (INCLUDING MRCP) TECHNIQUE: Multiplanar multisequence MR imaging of the abdomen was performed both before and after the administration of intravenous contrast. Heavily T2-weighted images of the biliary and pancreatic ducts were obtained, and three-dimensional MRCP images were rendered by post processing. CONTRAST:  36mL GADAVIST GADOBUTROL 1 MMOL/ML IV SOLN COMPARISON:  Right upper quadrant sonogram 06/15/2020 and CT AP 06/15/2020. FINDINGS: Diminished exam detail due to respiratory motion artifact. Lower chest: No acute findings. Cardiac enlargement. There is reflux of contrast material from the right heart into the IVC and right hepatic veins seen on the arterial phase images suggesting passive venous congestion secondary to right heart failure. Hepatobiliary: No suspicious focal liver abnormality identified. Stones are identified within the gallbladder as seen on previous imaging. These measure up to 5 mm. Upper limits of normal gallbladder wall thickening measuring up to 3 mm. No intrahepatic or common bile duct dilatation. No signs of choledocholithiasis. Pancreas: No mass, inflammatory changes, or other parenchymal abnormality identified. Spleen:  Within normal limits in size and appearance.  Adrenals/Urinary Tract: Bilateral adrenal nodules are identified which exhibit loss of signal on out of phase sequences compatible with benign adenomas. No kidney mass or hydronephrosis identified. Stomach/Bowel: Visualized portions within the abdomen are unremarkable. Vascular/Lymphatic: No pathologically enlarged lymph nodes identified. Aortic atherosclerosis. No abdominal aortic aneurysm demonstrated. Other:  None. Musculoskeletal: No suspicious bone lesions identified. IMPRESSION: 1. Diminished exam detail due to respiratory motion artifact. 2. Gallstones as seen on previous imaging. Upper limits of normal gallbladder wall thickening. No intrahepatic or common bile duct dilatation. No signs of choledocholithiasis. 3. Bilateral adrenal adenomas. Electronically Signed   By: Kerby Moors M.D.   On: 06/16/2020 10:56   DG Chest Port 1  View  Result Date: 07/03/2020 CLINICAL DATA:  Shortness of breath. EXAM: PORTABLE CHEST 1 VIEW COMPARISON:  06/29/2020 FINDINGS: 0452 hours. The cardio pericardial silhouette is enlarged. There is pulmonary vascular congestion without overt pulmonary edema. Interstitial markings are diffusely coarsened with chronic features. Staple line projects over the right lung base with associated pleuroparenchymal scarring. Interval decrease in the basilar infiltrative opacity described previously. Bones are diffusely demineralized. Telemetry leads overlie the chest. IMPRESSION: Enlarged cardiopericardial silhouette with pulmonary vascular congestion. Interval improvement in basilar aeration. Electronically Signed   By: Misty Stanley M.D.   On: 07/03/2020 07:17   DG CHEST PORT 1 VIEW  Result Date: 06/29/2020 CLINICAL DATA:  Sustained supraventricular tachycardia EXAM: PORTABLE CHEST 1 VIEW COMPARISON:  Yesterday FINDINGS: Tracheostomy tube in stable position. Bilateral pulmonary infiltrate. Postoperative right lung base. Chronic cardiomegaly. There has been CABG and coronary stenting.  No visible effusion or pneumothorax. IMPRESSION: Stable cardiomegaly and pulmonary infiltrates. Electronically Signed   By: Monte Fantasia M.D.   On: 06/29/2020 10:56   DG Chest Port 1 View  Result Date: 06/25/2020 CLINICAL DATA:  Shortness of breath and bilateral lower extremity swelling. EXAM: PORTABLE CHEST 1 VIEW COMPARISON:  June 15, 2020 FINDINGS: A tracheostomy tube is in place. Multiple sternal wires and vascular clips are seen. Moderate to marked severity diffusely increased interstitial lung markings are present. Radiopaque surgical sutures are seen overlying the lateral aspect of the right lung base. There is no evidence of a pleural effusion or pneumothorax. The cardiac silhouette is markedly enlarged and unchanged in size. Degenerative changes seen throughout the thoracic spine. IMPRESSION: Stable cardiomegaly with moderate to marked severity interstitial edema and/or chronic interstitial lung disease. Electronically Signed   By: Virgina Norfolk M.D.   On: 06/25/2020 16:44   DG Chest Port 1 View  Result Date: 06/15/2020 CLINICAL DATA:  Shortness of breath EXAM: PORTABLE CHEST 1 VIEW COMPARISON:  04/17/2020 FINDINGS: The tracheostomy tube terminates above the carina. The patient is status post prior median sternotomy. The heart size is enlarged. Coarse hazy bilateral airspace opacities are noted. These have improved since the prior study. There is architectural distortion and areas of scarring, similar to prior study. There is blunting of the costophrenic angles bilaterally similar to prior study. There is no acute osseous abnormality. IMPRESSION: Cardiomegaly with chronic bilateral airspace opacities. No definite acute cardiopulmonary process. Electronically Signed   By: Constance Holster M.D.   On: 06/15/2020 01:02   MR ABDOMEN MRCP W WO CONTAST  Result Date: 06/16/2020 CLINICAL DATA:  Evaluate for gallstones.  Elevated bilirubin levels. EXAM: MRI ABDOMEN WITHOUT AND WITH CONTRAST  (INCLUDING MRCP) TECHNIQUE: Multiplanar multisequence MR imaging of the abdomen was performed both before and after the administration of intravenous contrast. Heavily T2-weighted images of the biliary and pancreatic ducts were obtained, and three-dimensional MRCP images were rendered by post processing. CONTRAST:  34mL GADAVIST GADOBUTROL 1 MMOL/ML IV SOLN COMPARISON:  Right upper quadrant sonogram 06/15/2020 and CT AP 06/15/2020. FINDINGS: Diminished exam detail due to respiratory motion artifact. Lower chest: No acute findings. Cardiac enlargement. There is reflux of contrast material from the right heart into the IVC and right hepatic veins seen on the arterial phase images suggesting passive venous congestion secondary to right heart failure. Hepatobiliary: No suspicious focal liver abnormality identified. Stones are identified within the gallbladder as seen on previous imaging. These measure up to 5 mm. Upper limits of normal gallbladder wall thickening measuring up to 3 mm. No intrahepatic or common bile duct  dilatation. No signs of choledocholithiasis. Pancreas: No mass, inflammatory changes, or other parenchymal abnormality identified. Spleen:  Within normal limits in size and appearance. Adrenals/Urinary Tract: Bilateral adrenal nodules are identified which exhibit loss of signal on out of phase sequences compatible with benign adenomas. No kidney mass or hydronephrosis identified. Stomach/Bowel: Visualized portions within the abdomen are unremarkable. Vascular/Lymphatic: No pathologically enlarged lymph nodes identified. Aortic atherosclerosis. No abdominal aortic aneurysm demonstrated. Other:  None. Musculoskeletal: No suspicious bone lesions identified. IMPRESSION: 1. Diminished exam detail due to respiratory motion artifact. 2. Gallstones as seen on previous imaging. Upper limits of normal gallbladder wall thickening. No intrahepatic or common bile duct dilatation. No signs of choledocholithiasis. 3.  Bilateral adrenal adenomas. Electronically Signed   By: Kerby Moors M.D.   On: 06/16/2020 10:56   ECHOCARDIOGRAM COMPLETE  Result Date: 07/02/2020    ECHOCARDIOGRAM REPORT   Patient Name:   Theresa [name] Date of Exam: 07/02/2020 Medical Rec #:  [id number]       Height:       58.0 in Accession #:    [id number]      Weight:       213.8 lb Date of Birth:  [birth year]       BSA:          1.874 m Patient Age:    54 years        BP:           106/59 mmHg Patient Gender: F               HR:           89 bpm. Exam Location:  [facility] Procedure: 2D Echo Indications:    Dyspnea 786.09 / R06.00  History:        Patient has prior history of Echocardiogram examinations, most                 recent 04/20/2020. CHF, CAD, Prior CABG, Arrythmias:Atrial                 Flutter, Signs/Symptoms:Bacteremia; Risk Factors:Diabetes,                 Hypertension and Current Smoker. PHTN.  Sonographer:    Leavy Cella RDCS (AE) Referring Phys: [id number] [place]  1. Difficult asssessment of LVEF in setting of aflutter as well as limited visualization. . Left ventricular ejection fraction, by estimation, is 50%. The left ventricle has mildly decreased function. The left ventricle demonstrates global hypokinesis. There is mild left ventricular hypertrophy. Left ventricular diastolic parameters are indeterminate.  2. Right ventricular systolic function was not well visualized. The right ventricular size is not well visualized.  3. Left atrial size was mildly dilated.  4. The mitral valve is normal in structure. No evidence of mitral valve regurgitation. No evidence of mitral stenosis.  5. The aortic valve is tricuspid. Aortic valve regurgitation is not visualized. No aortic stenosis is present.  6. The inferior vena cava is normal in size with greater than 50% respiratory variability, suggesting right atrial pressure of 3 mmHg. FINDINGS  Left Ventricle: Difficult asssessment of LVEF in setting of aflutter as well  as limited visualization. Left ventricular ejection fraction, by estimation, is 50%. The left ventricle has mildly decreased function. The left ventricle demonstrates global hypokinesis. The left ventricular internal cavity size was normal in size. There is mild left ventricular hypertrophy. Left ventricular diastolic parameters are indeterminate. Right Ventricle: The right ventricular size is not  well visualized. Right vetricular wall thickness was not well visualized. Right ventricular systolic function was not well visualized. Left Atrium: Left atrial size was mildly dilated. Right Atrium: Right atrial size was normal in size. Pericardium: There is no evidence of pericardial effusion. Mitral Valve: The mitral valve is normal in structure. No evidence of mitral valve regurgitation. No evidence of mitral valve stenosis. Tricuspid Valve: The tricuspid valve is normal in structure. Tricuspid valve regurgitation is mild . No evidence of tricuspid stenosis. Aortic Valve: The aortic valve is tricuspid. Aortic valve regurgitation is not visualized. No aortic stenosis is present. Aortic valve mean gradient measures 5.6 mmHg. Aortic valve peak gradient measures 13.8 mmHg. Aortic valve area, by VTI measures 1.51  cm. Pulmonic Valve: The pulmonic valve was not well visualized. Pulmonic valve regurgitation is not visualized. No evidence of pulmonic stenosis. Aorta: The aortic root is normal in size and structure. Pulmonary Artery: Indeterminant PASP, inadequate TR jet. Venous: The inferior vena cava was not well visualized. The inferior vena cava is normal in size with greater than 50% respiratory variability, suggesting right atrial pressure of 3 mmHg. IAS/Shunts: The interatrial septum was not well visualized.  LEFT VENTRICLE PLAX 2D LVIDd:         3.37 cm  Diastology LVIDs:         2.60 cm  LV e' medial:    5.10 cm/s LV PW:         1.28 cm  LV E/e' medial:  14.8 LV IVS:        1.18 cm  LV e' lateral:   7.13 cm/s LVOT  diam:     1.80 cm  LV E/e' lateral: 10.6 LV SV:         41 LV SV Index:   22 LVOT Area:     2.54 cm  RIGHT VENTRICLE RV S prime:     9.98 cm/s TAPSE (M-mode): 1.5 cm LEFT ATRIUM           Index       RIGHT ATRIUM           Index LA diam:      4.60 cm 2.46 cm/m  RA Area:     14.40 cm LA Vol (A4C): 55.7 ml 29.73 ml/m RA Volume:   36.60 ml  19.53 ml/m  AORTIC VALVE AV Area (Vmax):    1.62 cm AV Area (Vmean):   1.51 cm AV Area (VTI):     1.51 cm AV Vmax:           185.81 cm/s AV Vmean:          107.569 cm/s AV VTI:            0.269 m AV Peak Grad:      13.8 mmHg AV Mean Grad:      5.6 mmHg LVOT Vmax:         118.15 cm/s LVOT Vmean:        63.734 cm/s LVOT VTI:          0.159 m LVOT/AV VTI ratio: 0.59  AORTA Ao Root diam: 2.80 cm MITRAL VALVE               TRICUSPID VALVE MV Area (PHT): 4.71 cm    TR Peak grad:   44.9 mmHg MV Decel Time: 161 msec    TR Vmax:        335.00 cm/s MV E velocity: 75.50 cm/s MV A velocity: 29.50 cm/s  SHUNTS MV E/A  ratio:  2.56        Systemic VTI:  0.16 m                            Systemic Diam: 1.80 cm Carlyle Dolly MD Electronically signed by Carlyle Dolly MD Signature Date/Time: 07/02/2020/12:18:41 PM    Final    [place] EKG SITE RITE  Result Date: 06/16/2020 If Site Rite image not attached, placement could not be confirmed due to current cardiac rhythm.  US Abdomen Limited RUQ  Result Date: 06/15/2020 CLINICAL DATA:  Epigastric abdomen pain since yesterday. EXAM: ULTRASOUND ABDOMEN LIMITED RIGHT UPPER QUADRANT COMPARISON:  CT abdomen and pelvis June 15, 2020 FINDINGS: Gallbladder: Gallstones are noted the gallbladder. There is no pericholecystic fluid. Gallbladder wall measures 2.7 mm. No sonographic Murphy sign noted by sonographer. Common bile duct: Diameter: 3.6 mm Liver: No focal lesion identified. Within normal limits in parenchymal echogenicity. Portal vein is patent on color Doppler imaging with normal direction of blood flow towards the liver. Other: None.  IMPRESSION: Cholelithiasis without sonographic evidence of acute cholecystitis. Electronically Signed   By: Abelardo Diesel M.D.   On: 06/15/2020 11:36      Subjective: Pt says she feels much better she feels ready for going home.  She is agreeable to resumption of home health services.   Discharge Exam: Vitals:   07/07/20 0519 07/07/20 0727  BP: 122/86   Pulse: (!) 56   Resp:    Temp:    SpO2:  93%   Vitals:   07/07/20 0500 07/07/20 0518 07/07/20 0519 07/07/20 0727  BP: 107/63 (!) 91/47 122/86   Pulse: (!) 56 (!) 52 (!) 56   Resp: 18 20    Temp: 97.9 F (36.6 C) 98 F (36.7 C)    TempSrc: Axillary Oral    SpO2: 92% 96%  93%  Weight: 96.8 kg     Height:       General: Pt is alert, awake, not in acute distress Cardiovascular: normal S1/S2 +, no rubs, no gallops Respiratory: trach collar in place, CTA bilaterally, no wheezing, no rhonchi Abdominal: Soft, NT, ND, bowel sounds + Extremities: trace pretibial edema, no cyanosis   The results of significant diagnostics from this hospitalization (including imaging, microbiology, ancillary and laboratory) are listed below for reference.     Microbiology: Recent Results (from the past 240 hour(s))  MRSA PCR Screening     Status: None   Collection Time: 06/29/20  9:12 AM   Specimen: Nasal Mucosa; Nasopharyngeal  Result Value Ref Range Status   MRSA by PCR NEGATIVE NEGATIVE Final    Comment:        The GeneXpert MRSA Assay (FDA approved for NASAL specimens only), is one component of a comprehensive MRSA colonization surveillance program. It is not intended to diagnose MRSA infection nor to guide or monitor treatment for MRSA infections. Performed at [facility], [address], [place], [place] [address]      Labs: BNP (last 3 results) Recent Labs    06/16/20 0712 06/25/20 1633 07/03/20 0409  BNP 920.0* 536.0* 623.7*   Basic Metabolic Panel: Recent Labs  Lab 07/02/20 0505 07/02/20 0505 07/03/20 0409  07/04/20 0410 07/05/20 0508 07/06/20 0646 07/07/20 0504  NA 138   < > 137 139 139 138 136  K 3.9   < > 3.3* 3.9 3.9 4.3 4.4  CL 90*   < > 90* 91* 89* 92* 91*  CO2 38*   < >  37* 39* 40* 38* 37*  GLUCOSE 103*   < > 128* 84 153* 130* 196*  BUN 29*   < > 32* 29* 35* 33* 29*  CREATININE 0.94   < > 0.98 0.86 1.01* 1.01* 0.94  CALCIUM 8.3*   < > 9.0 9.1 9.2 8.8* 8.9  MG 1.9  --  1.9 2.0  --  2.1 1.9   < > = values in this interval not displayed.   Liver Function Tests: No results for input(s): AST, ALT, ALKPHOS, BILITOT, PROT, ALBUMIN in the last 168 hours. No results for input(s): LIPASE, AMYLASE in the last 168 hours. No results for input(s): AMMONIA in the last 168 hours. CBC: Recent Labs  Lab 07/01/20 0319 07/02/20 0505 07/04/20 0410 07/05/20 0508  WBC 6.9 7.1 7.3 6.7  HGB 11.9* 12.4 11.5* 11.3*  HCT 37.4 40.7 37.4 36.4  MCV 97.1 99.5 98.9 98.6  PLT 193 220 230 251   Cardiac Enzymes: No results for input(s): CKTOTAL, CKMB, CKMBINDEX, TROPONINI in the last 168 hours. BNP: Invalid input(s): POCBNP CBG: Recent Labs  Lab 07/06/20 1103 07/06/20 1619 07/06/20 2016 07/06/20 2052 07/07/20 0940  GLUCAP 241* 256* 206* 271* 148*   D-Dimer No results for input(s): DDIMER in the last 72 hours. Hgb A1c No results for input(s): HGBA1C in the last 72 hours. Lipid Profile No results for input(s): CHOL, HDL, LDLCALC, TRIG, CHOLHDL, LDLDIRECT in the last 72 hours. Thyroid function studies No results for input(s): TSH, T4TOTAL, T3FREE, THYROIDAB in the last 72 hours.  Invalid input(s): FREET3 Anemia work up No results for input(s): VITAMINB12, FOLATE, FERRITIN, TIBC, IRON, RETICCTPCT in the last 72 hours. Urinalysis    Component Value Date/Time   COLORURINE AMBER (A) 06/15/2020 0119   APPEARANCEUR CLEAR 06/15/2020 0119   LABSPEC 1.014 06/15/2020 0119   PHURINE 6.0 06/15/2020 0119   GLUCOSEU NEGATIVE 06/15/2020 0119   HGBUR NEGATIVE 06/15/2020 0119   BILIRUBINUR SMALL (A)  06/15/2020 0119   KETONESUR NEGATIVE 06/15/2020 0119   PROTEINUR 30 (A) 06/15/2020 0119   NITRITE NEGATIVE 06/15/2020 0119   LEUKOCYTESUR NEGATIVE 06/15/2020 0119   Sepsis Labs Invalid input(s): PROCALCITONIN,  WBC,  LACTICIDVEN Microbiology Recent Results (from the past 240 hour(s))  MRSA PCR Screening     Status: None   Collection Time: 06/29/20  9:12 AM   Specimen: Nasal Mucosa; Nasopharyngeal  Result Value Ref Range Status   MRSA by PCR NEGATIVE NEGATIVE Final    Comment:        The GeneXpert MRSA Assay (FDA approved for NASAL specimens only), is one component of a comprehensive MRSA colonization surveillance program. It is not intended to diagnose MRSA infection nor to guide or monitor treatment for MRSA infections. Performed at [facility], [address] [place], [place] [address]    Time coordinating discharge: 38 minutes   SIGNED:  Irwin Brakeman, MD  [facility] 07/07/2020, 10:38 AM How to contact the [facility] Attending or Consulting provider [place] or covering provider during after hours [place], for this patient?  1. Check the care team in [facility] and look for a) attending/consulting [facility] provider listed and b) the [facility] team listed 2. Log into [email or web address] and use [facility]'s universal password to access. If you do not have the password, please contact the hospital operator. 3. Locate the [facility] provider you are looking for under [facility] and page to a number that you can be directly reached. 4. If you still have difficulty reaching the provider, please  page the [facility] (Director on Call) for the Hospitalists listed on amion for assistance.

## 2020-07-07 NOTE — Progress Notes (Signed)
Progress Note  Patient Name: Theresa [name] Date of Encounter: 07/07/2020  [facility] Cardiologist: [name], MD   Subjective   Breathing is back to baseline Inpatient Medications    Scheduled Meds: . amiodarone  400 mg Oral BID  . bumetanide (BUMEX) IV  2 mg Intravenous Q12H  . Chlorhexidine Gluconate Cloth  6 each Topical Daily  . digoxin  0.25 mg Oral Daily  . doxycycline  100 mg Oral Q12H  . gabapentin  900 mg Oral QHS  . insulin aspart  0-20 Units Subcutaneous TID WC  . insulin aspart  0-5 Units Subcutaneous QHS  . insulin aspart  6 Units Subcutaneous TID WC  . insulin glargine  25 Units Subcutaneous QHS  . metoprolol tartrate  25 mg Oral TID  . pantoprazole  40 mg Oral Daily  . potassium chloride  60 mEq Oral Daily  . sertraline  50 mg Oral QHS  . tamsulosin  0.4 mg Oral Daily  . warfarin  2.5 mg Oral ONCE-1600  . Warfarin - Pharmacist Dosing Inpatient   Does not apply q1600   Continuous Infusions:  PRN Meds: acetaminophen **OR** acetaminophen, guaiFENesin-dextromethorphan, levalbuterol, LORazepam, ondansetron **OR** ondansetron (ZOFRAN) IV, polyethylene glycol, sodium chloride flush   Vital Signs    Vitals:   07/07/20 0500 07/07/20 0518 07/07/20 0519 07/07/20 0727  BP: 107/63 (!) 91/47 122/86   Pulse: (!) 56 (!) 52 (!) 56   Resp: 18 20    Temp: 97.9 F (36.6 C) 98 F (36.7 C)    TempSrc: Axillary Oral    SpO2: 92% 96%  93%  Weight: 96.8 kg     Height:        Intake/Output Summary (Last 24 hours) at 07/07/2020 0905 Last data filed at 07/07/2020 0000 Gross per 24 hour  Intake 960 ml  Output 4 ml  Net 956 ml   Last 3 Weights 07/07/2020 07/05/2020 07/04/2020  Weight (lbs) 213 lb 4.8 oz 217 lb 13 oz 212 lb 1.3 oz  Weight (kg) 96.752 kg 98.8 kg 96.2 kg      Telemetry    Aflutter rates 50s to 60s- Personally Reviewed  ECG    n/a - Personally Reviewed  Physical Exam   GEN: No acute distress.   Neck: No JVD Cardiac: irreg, no  murmurs, rubs, or gallops.  Respiratory: mild crackles bases GI: Soft, nontender, non-distended  MS: No edema; No deformity. Neuro:  Nonfocal  Psych: Normal affect   Labs    High Sensitivity Troponin:   Recent Labs  Lab 06/29/20 0910 06/29/20 1113  TROPONINIHS 24* 31*      Chemistry Recent Labs  Lab 07/05/20 0508 07/06/20 0646 07/07/20 0504  NA 139 138 136  K 3.9 4.3 4.4  CL 89* 92* 91*  CO2 40* 38* 37*  GLUCOSE 153* 130* 196*  BUN 35* 33* 29*  CREATININE 1.01* 1.01* 0.94  CALCIUM 9.2 8.8* 8.9  GFRNONAA >60 >60 >60  ANIONGAP 10 8 8      Hematology Recent Labs  Lab 07/02/20 0505 07/04/20 0410 07/05/20 0508  WBC 7.1 7.3 6.7  RBC 4.09 3.78* 3.69*  HGB 12.4 11.5* 11.3*  HCT 40.7 37.4 36.4  MCV 99.5 98.9 98.6  MCH 30.3 30.4 30.6  MCHC 30.5 30.7 31.0  RDW 15.5 15.7* 15.7*  PLT 220 230 251    BNP Recent Labs  Lab 07/03/20 0409  BNP 480.0*     DDimer No results for input(s): DDIMER in the last 168 hours.  Radiology    No results found.  Cardiac Studies    Patient Profile     54 y.o.femalewith a hx of chronic combined systyolic/diastolic HF, CAD with prior CABG, chronic respiratory failure with chronic trach,who is being seen today for the evaluation of SOB and leg edemaat the request of Dr Manuella Ghazi  Assessment & Plan    1. Acute on chronic combined systolic/diastolic HF - 03/5169 echo LVEF 40-45%, grade III DDx, normal RV function - 06/2020 echo LVEF 50%, indet DDx - admitted with 14 lbs weight gainfrom home report (from hopsital weights 5 lbs since discharge last week), LE edema. BNP in the 500s, pulm edema on cxr  I/Os are incomplete. She is on IV bumex 2mg  bid. Mild variations in Cr without clear trend. Gave 2.5mg  of oral metolazone on 10/29 with mild uptrend in Cr now resolving. Weights appear inaccurate.  - back to her baseline O2 of 4L [place] - we will change her bumex from IV to 2mg  oral bid.      2.Aflutter with RVR - new diagnosis  this admission - soft bp's have affected manaement.  - has already been on coumadin for history of prior DVT/Lupus antiocoagulant  06/25/20 INR was 1.8, 10/13 INR 1.6. she would require TEE if cardioverted.   - on oral amio 400mg  bid, oral digoxin 0.25mg  daily, lopressor 25mg  tid.  - after significant issues with ongoing tachycardia this admission now mildly bradycardic. Lower digoxin to 0.125mg  daily and lopressor to 25mg  bid. Continue oral amio 400mg  bid for 4 more days, then 200mg  bid x 2 weeks, then 200mg  daily - at f/u pending rate control may consider cardioversion, with her chronic respiratory failure on 4L will need to way risks vs benefits. Would look to avoid TEE with therapeutic INRs for 3 weeks, her last subtherapeutic INR was 06/28/20.      3. Chronic respiraotry failure - has chronic trach, baseline 4L O2 at home  4. CAD - mild trop in setting of RVR, hypoxia - no plans for ischemic testing at this time  [facility] for discharge, we will arrange outpatient f/u.     For questions or updates, please contact [place] Please consult [email or web address] for contact info under        Signed, Carlyle Dolly, MD  07/07/2020, 9:05 AM

## 2020-07-07 NOTE — Discharge Instructions (Signed)
Amiodarone Instructions: Take 400 mg twice daily for 4 more days, then take 200 mg twice daily for 2 weeks, then take 200 mg daily after that.    Please follow up with cardiology they will contact you with your appointment.    IMPORTANT INFORMATION: PAY CLOSE ATTENTION   PHYSICIAN DISCHARGE INSTRUCTIONS  Follow with Primary care provider  Monico Blitz, MD  and other consultants as instructed by your Hospitalist Physician  [place] IF SYMPTOMS COME BACK, WORSEN OR NEW PROBLEM DEVELOPS   Please note: You were cared for by a hospitalist during your hospital stay. Every effort will be made to forward records to your primary care provider.  You can request that your primary care provider send for your hospital records if they have not received them.  Once you are discharged, your primary care physician will handle any further medical issues. Please note that NO REFILLS for any discharge medications will be authorized once you are discharged, as it is imperative that you return to your primary care physician (or establish a relationship with a primary care physician if you do not have one) for your post hospital discharge needs so that they can reassess your need for medications and monitor your lab values.  Please get a complete blood count and chemistry panel checked by your Primary MD at your next visit, and again as instructed by your Primary MD.  Get Medicines reviewed and adjusted: Please take all your medications with you for your next visit with your Primary MD  Laboratory/radiological data: Please request your Primary MD to go over all hospital tests and procedure/radiological results at the follow up, please ask your primary care provider to get all Hospital records sent to his/her office.  In some cases, they will be blood work, cultures and biopsy results pending at the time of your discharge. Please request that your primary care provider follow up  on these results.  If you are diabetic, please bring your blood sugar readings with you to your follow up appointment with primary care.    Please call and make your follow up appointments as soon as possible.    Also Note the following: If you experience worsening of your admission symptoms, develop shortness of breath, life threatening emergency, suicidal or homicidal thoughts you must seek medical attention immediately by calling 911 or calling your MD immediately  if symptoms less severe.  You must read complete instructions/literature along with all the possible adverse reactions/side effects for all the Medicines you take and that have been prescribed to you. Take any new Medicines after you have completely understood and accpet all the possible adverse reactions/side effects.   Do not drive when taking Pain medications or sleeping medications (Benzodiazepines)  Do not take more than prescribed Pain, Sleep and Anxiety Medications. It is not advisable to combine anxiety,sleep and pain medications without talking with your primary care practitioner  Special Instructions: If you have smoked or chewed Tobacco  in the last 2 yrs please stop smoking, stop any regular Alcohol  and or any Recreational drug use.  Wear Seat belts while driving.  Do not drive if taking any narcotic, mind altering or controlled substances or recreational drugs or alcohol.

## 2020-07-07 NOTE — Progress Notes (Signed)
ANTICOAGULATION CONSULT NOTE -   Pharmacy Consult for warfarin Indication: lupus anticoagulant and hx VTE  Patient Measurements: Height: 4\' 10"  (147.3 cm) Weight: 96.8 kg (213 lb 4.8 oz) IBW/kg (Calculated) : 40.9  Vital Signs: Temp: 98 F (36.7 C) (11/01 0518) Temp Source: Oral (11/01 0518) BP: 122/86 (11/01 0519) Pulse Rate: 56 (11/01 0519)  Labs: Recent Labs    07/05/20 0508 07/06/20 0646 07/07/20 0504  HGB 11.3*  --   --   HCT 36.4  --   --   PLT 251  --   --   LABPROT 25.9* 26.4* 28.6*  INR 2.5* 2.5* 2.8*  CREATININE 1.01* 1.01* 0.94    Estimated Creatinine Clearance: 68.4 mL/min (by C-G formula based on SCr of 0.94 mg/dL).  Assessment: 77 yof who presents to the ED with increasing SOB and bilateral leg swelling. She was recently discharged from the hospital on 10/15. She takes warfarin PTA for lupus anticoagulant and hx VTE. Pharmacy consulted to dose warfarin while inpatient. With addition of amiodarone on 10/24 will likely interact with coumadin and dose amount will be less.   Home dose:  2.5 mg daily except 5 mg MWF 10/22: On 10/20 Coumadin 2.5mg  dose not given. So on 10/21 7.5mg  given.  INR is therapeutic at 2.5 today  Goal of Therapy:  INR 2-3 Monitor platelets by anticoagulation protocol: Yes   Plan:  Give warfarin 2.5 mg po x1 dose today Daily INR and every other day CBC Monitor for signs and symptoms  of bleeding  Thank you for involving pharmacy in this patient's care.  Margot Ables, PharmD Clinical Pharmacist 07/07/2020 8:19 AM

## 2020-07-07 NOTE — TOC Transition Note (Signed)
Transition of Care [facility]) - CM/SW Discharge Note   Patient Details  Name: SCOTTLYNN LINDELL MRN: [id number] Date of Birth: 12-14-1965  [facility] CM/SW Contact:  Boneta Lucks, RN Phone Number: 07/07/2020, 11:11 AM   Clinical Narrative:   Patient discharging home today. Active with [facility], orders have been placed and [facility] updated.    Final next level of care: [place] Barriers to Discharge: Continued Medical Work up   Patient Goals and CMS Choice Patient states their goals for this hospitalization and ongoing recovery are:: Return home with [facility]   Choice offered to / list presented to : NA  D   Discharge Plan and Services In-house Referral: Clinical Social Work Discharge Planning Services: NA Post Acute Care Choice: Home Health          DME Arranged: N/A DME Agency: NA   Readmission Risk Interventions Readmission Risk Prevention Plan 05/17/2019  Transportation Screening Complete  PCP or Specialist Appt within 3-5 Days Complete  HRI or Home Care Consult Complete  Social Work Consult for [place] Planning/Counseling Complete  Palliative Care Screening Not Applicable  Medication Review [employer]) Complete  Some recent data might be hidden

## 2020-07-08 DIAGNOSIS — I110 Hypertensive heart disease with heart failure: Secondary | ICD-10-CM | POA: Diagnosis not present

## 2020-07-08 DIAGNOSIS — I480 Paroxysmal atrial fibrillation: Secondary | ICD-10-CM | POA: Diagnosis not present

## 2020-07-08 DIAGNOSIS — I5043 Acute on chronic combined systolic (congestive) and diastolic (congestive) heart failure: Secondary | ICD-10-CM | POA: Diagnosis not present

## 2020-07-08 DIAGNOSIS — J441 Chronic obstructive pulmonary disease with (acute) exacerbation: Secondary | ICD-10-CM | POA: Diagnosis not present

## 2020-07-08 DIAGNOSIS — J9621 Acute and chronic respiratory failure with hypoxia: Secondary | ICD-10-CM | POA: Diagnosis not present

## 2020-07-08 DIAGNOSIS — I2510 Atherosclerotic heart disease of native coronary artery without angina pectoris: Secondary | ICD-10-CM | POA: Diagnosis not present

## 2020-07-10 ENCOUNTER — Other Ambulatory Visit: Payer: Self-pay

## 2020-07-10 DIAGNOSIS — J9621 Acute and chronic respiratory failure with hypoxia: Secondary | ICD-10-CM | POA: Diagnosis not present

## 2020-07-10 DIAGNOSIS — I2510 Atherosclerotic heart disease of native coronary artery without angina pectoris: Secondary | ICD-10-CM | POA: Diagnosis not present

## 2020-07-10 DIAGNOSIS — I5043 Acute on chronic combined systolic (congestive) and diastolic (congestive) heart failure: Secondary | ICD-10-CM | POA: Diagnosis not present

## 2020-07-10 DIAGNOSIS — I110 Hypertensive heart disease with heart failure: Secondary | ICD-10-CM | POA: Diagnosis not present

## 2020-07-10 DIAGNOSIS — J441 Chronic obstructive pulmonary disease with (acute) exacerbation: Secondary | ICD-10-CM | POA: Diagnosis not present

## 2020-07-10 DIAGNOSIS — I480 Paroxysmal atrial fibrillation: Secondary | ICD-10-CM | POA: Diagnosis not present

## 2020-07-10 NOTE — Patient Outreach (Signed)
[facility]) Care Management  07/10/2020  Theresa [name] [birth year] [id number]   Red Emmi: Date of call:  07/09/2020 Reason for alert:  Transportation to appointment: no   Placed call to patient and explained reason for call. Patient reports to me she does have transportation to her MD appointments.  Reports she is doing well since discharge.   PLAN: close case as no needs identified.  Tomasa Rand, RN, BSN, CEN [facility] [employer] [phone]

## 2020-07-11 DIAGNOSIS — E1165 Type 2 diabetes mellitus with hyperglycemia: Secondary | ICD-10-CM | POA: Diagnosis not present

## 2020-07-11 DIAGNOSIS — Z6841 Body Mass Index (BMI) 40.0 and over, adult: Secondary | ICD-10-CM | POA: Diagnosis not present

## 2020-07-11 DIAGNOSIS — Z23 Encounter for immunization: Secondary | ICD-10-CM | POA: Diagnosis not present

## 2020-07-11 DIAGNOSIS — I10 Essential (primary) hypertension: Secondary | ICD-10-CM | POA: Diagnosis not present

## 2020-07-11 DIAGNOSIS — Z299 Encounter for prophylactic measures, unspecified: Secondary | ICD-10-CM | POA: Diagnosis not present

## 2020-07-11 DIAGNOSIS — J441 Chronic obstructive pulmonary disease with (acute) exacerbation: Secondary | ICD-10-CM | POA: Diagnosis not present

## 2020-07-11 DIAGNOSIS — F17210 Nicotine dependence, cigarettes, uncomplicated: Secondary | ICD-10-CM | POA: Diagnosis not present

## 2020-07-11 DIAGNOSIS — I480 Paroxysmal atrial fibrillation: Secondary | ICD-10-CM | POA: Diagnosis not present

## 2020-07-15 DIAGNOSIS — I110 Hypertensive heart disease with heart failure: Secondary | ICD-10-CM | POA: Diagnosis not present

## 2020-07-15 DIAGNOSIS — I2510 Atherosclerotic heart disease of native coronary artery without angina pectoris: Secondary | ICD-10-CM | POA: Diagnosis not present

## 2020-07-15 DIAGNOSIS — I5043 Acute on chronic combined systolic (congestive) and diastolic (congestive) heart failure: Secondary | ICD-10-CM | POA: Diagnosis not present

## 2020-07-15 DIAGNOSIS — J9621 Acute and chronic respiratory failure with hypoxia: Secondary | ICD-10-CM | POA: Diagnosis not present

## 2020-07-15 DIAGNOSIS — I480 Paroxysmal atrial fibrillation: Secondary | ICD-10-CM | POA: Diagnosis not present

## 2020-07-15 DIAGNOSIS — J441 Chronic obstructive pulmonary disease with (acute) exacerbation: Secondary | ICD-10-CM | POA: Diagnosis not present

## 2020-07-17 DIAGNOSIS — J9621 Acute and chronic respiratory failure with hypoxia: Secondary | ICD-10-CM | POA: Diagnosis not present

## 2020-07-17 DIAGNOSIS — I110 Hypertensive heart disease with heart failure: Secondary | ICD-10-CM | POA: Diagnosis not present

## 2020-07-17 DIAGNOSIS — J441 Chronic obstructive pulmonary disease with (acute) exacerbation: Secondary | ICD-10-CM | POA: Diagnosis not present

## 2020-07-17 DIAGNOSIS — I5043 Acute on chronic combined systolic (congestive) and diastolic (congestive) heart failure: Secondary | ICD-10-CM | POA: Diagnosis not present

## 2020-07-17 DIAGNOSIS — I2510 Atherosclerotic heart disease of native coronary artery without angina pectoris: Secondary | ICD-10-CM | POA: Diagnosis not present

## 2020-07-17 DIAGNOSIS — I480 Paroxysmal atrial fibrillation: Secondary | ICD-10-CM | POA: Diagnosis not present

## 2020-07-20 NOTE — Progress Notes (Signed)
Cardiology Clinic Note   Patient Name: Theresa [name] Date of Encounter: 07/21/2020  Primary Care Provider:  Monico Blitz, MD Primary Cardiologist:  [name], MD  Patient Profile    Theresa [name] 54 year old female presents the clinic today for follow-up evaluation of her essential hypertension coronary artery disease.  Past Medical History    Past Medical History:  Diagnosis Date  . Acute kidney failure with lesion of tubular necrosis (HCC)   . Acute systolic heart failure ([place])   . CAD (coronary artery disease)    a. s/p prior PCI. b. CABG 2007 at [facility] in [place] 2007. c. inferior STEMI 10/2015 s/p DES to dSVG-PDA.  . Cardiac arrest ([place])   . Cervical cancer ([place])   . Chronic diastolic CHF (congestive heart failure) ([place])   . Chronic respiratory failure ([place])    s/p tracheostomy 2002  . Chronic RUQ pain   . COPD (chronic obstructive pulmonary disease) ([place])   . Diabetes mellitus ([place])   . DVT (deep venous thrombosis) ([place])   . Endometriosis   . History of gallstones 01/2016   seen on Ultrasound  . History of HIDA scan 11/2016   normal  . HTN (hypertension)   . Hyperlipidemia   . Lupus anticoagulant disorder (HCC)    on coumadin  . Morbid obesity ([place])   . Psoriasis   . ST elevation (STEMI) myocardial infarction involving right coronary artery ([place]) 10/29/15   stent to VG to PDA  . Toe fracture, right 03/29/2018  . Tracheostomy in place [facility]), chronic since 2002 11/03/2015   Past Surgical History:  Procedure Laterality Date  . CARDIAC CATHETERIZATION N/A 10/29/2015   Procedure: Left Heart Cath and Cors/Grafts Angiography;  Surgeon: Burnell Blanks, MD;  Location: [place] [facility];  Service: Cardiovascular;  Laterality: N/A;  . CARDIAC CATHETERIZATION  10/29/2015   Procedure: Coronary Stent Intervention;  Surgeon: Burnell Blanks, MD;  Location: [place] [facility];  Service: Cardiovascular;;  . CAROTID STENT    . CESAREAN  SECTION WITH BILATERAL TUBAL LIGATION    . CORONARY ARTERY BYPASS GRAFT  2007   2V  . IR GASTROSTOMY TUBE MOD SED  11/13/2018  . RIGHT/LEFT HEART CATH AND CORONARY/GRAFT ANGIOGRAPHY N/A 03/24/2018   Procedure: RIGHT/LEFT HEART CATH AND CORONARY/GRAFT ANGIOGRAPHY;  Surgeon: Belva Crome, MD;  Location: [place] [facility];  Service: Cardiovascular;  Laterality: N/A;  . TRACHEOSTOMY      Allergies  Allergies  Allergen Reactions  . Penicillins Rash    Has patient had a PCN reaction causing immediate rash, facial/tongue/throat swelling, SOB or lightheadedness with hypotension: Yes Has patient had a PCN reaction causing severe rash involving mucus membranes or skin necrosis: No Has patient had a PCN reaction that required hospitalization No Has patient had a PCN reaction occurring within the last 10 years: No If all of the above answers are "NO", then may proceed with Cephalosporin use.   REACTION: rash Pt has tolerated cefepime in the past.  . Ciprofloxacin     nausea  . Ibuprofen Rash    swelling in leg     History of Present Illness    Theresa [name] has a PMH of essential hypertension, CAD, DVT, chronic systolic and CHF, pulmonary hypertension, atrial flutter with RVR, OSA, COPD, cholelithiasis, lupus anticoagulant syndrome, hyperlipidemia, CABG, and tobacco abuse.  She was admitted to the hospital on 06/25/2020 until 07/07/2020.  She was seen by home health nurse who noted she had  gained about 14 pounds since her recent hospital discharge.  She was noted to have increasing difficulty with her breathing as well as bilateral lower extremity swelling and bloating.  She presented to the emergency department.  She reported compliance with her torsemide 40 mg twice daily and denied dietary indiscretion.  She has a tracheostomy.  She was noted to have a productive cough, wheezing but denied chest pain.  Her torsemide was switched to Bumex.  She was noted to have new onset atrial flutter with RVR.   She was placed on amiodarone digoxin and metoprolol.  TSH 4.4.  Anticoagulation was therapeutic.  Her troponins were slightly elevated in the setting of coronary artery disease.  This was felt to be mild demand ischemia.  She denied chest pain.  Her potassium was repleted due to hypokalemia.  She was also noted to have mild cellulitis of her left forearm where her IV had been removed and had become infected.  She was given doxycycline 100 mg twice daily x3 days.  She presents the clinic today for follow-up evaluation states she does not feel well.  She is coughing up yellow purulent.  Her oxygen saturation is 83% on 4 L nasal cannula.  She has lower extremity swelling and her weight is up 6.3 pounds.  She reports compliance with her low-sodium diet and diuretics.  I have reviewed the case with Dr. Domenic Polite.  I contacted the emergency department and reviewed patient's condition.  We will send her to the emergency department for further evaluation and treatment.  Today she denies chest pain, palpitations, melena, hematuria, hemoptysis, diaphoresis, weakness, presyncope, syncope.  Home Medications    Prior to Admission medications   Medication Sig Start Date End Date Taking? Authorizing Provider  albuterol (PROVENTIL) (2.5 MG/3ML) 0.083% nebulizer solution Take 3 mLs (2.5 mg total) by nebulization every 4 (four) hours as needed for wheezing. 07/07/20   Johnson, Clanford L, MD  albuterol (VENTOLIN HFA) 108 (90 Base) MCG/ACT inhaler Inhale 2 puffs into the lungs every 4 (four) hours as needed for wheezing or shortness of breath.    [provider]  amiodarone (PACERONE) 200 MG tablet Take 2 tabs po BID x 4 days, then 1 tab BID x 14 days, then 1 po daily 07/07/20   Johnson, Clanford L, MD  bumetanide (BUMEX) 2 MG tablet Take 1 tablet (2 mg total) by mouth 2 (two) times daily. 07/07/20   Johnson, Clanford L, MD  cephALEXin (KEFLEX) 500 MG capsule Take 1 capsule (500 mg total) by mouth every 8 (eight)  hours. 06/20/20   Kathie Dike, MD  digoxin (LANOXIN) 0.125 MG tablet Take 1 tablet (0.125 mg total) by mouth daily. 07/08/20   Johnson, Clanford L, MD  gabapentin (NEURONTIN) 300 MG capsule Take 3 capsules (900 mg total) by mouth at bedtime. 07/07/20   Johnson, Clanford L, MD  insulin degludec (TRESIBA FLEXTOUCH) 100 UNIT/ML FlexTouch Pen Inject 30 Units into the skin at bedtime. 07/07/20   Johnson, Clanford L, MD  LORazepam (ATIVAN) 0.5 MG tablet Take 0.5 mg by mouth every 6 (six) hours as needed for anxiety.     [provider]  metoprolol tartrate (LOPRESSOR) 25 MG tablet Take 1 tablet (25 mg total) by mouth 2 (two) times daily. 07/07/20   Johnson, Clanford L, MD  mometasone (ELOCON) 0.1 % ointment Apply 1 application topically daily as needed. 06/24/20   [provider]  nitroGLYCERIN (NITROSTAT) 0.4 MG SL tablet Place 0.4 mg under the tongue every 5 (  five) minutes as needed for chest pain.    [provider]  NOVOLOG FLEXPEN 100 UNIT/ML FlexPen Inject 1-10 Units into the skin 3 (three) times daily before meals. 04/06/20   [provider]  OZEMPIC, 0.25 OR 0.5 MG/DOSE, 2 MG/1.5ML SOPN Inject 0.5 mg into the skin every Wednesday.  04/08/19   [provider]  pantoprazole (PROTONIX) 20 MG tablet Take 20 mg by mouth daily. 02/16/20   [provider]  polyethylene glycol (MIRALAX / GLYCOLAX) 17 g packet Take 17 g by mouth daily as needed for mild constipation. 06/20/20   Kathie Dike, MD  potassium chloride SA (KLOR-CON) 20 MEQ tablet Take 2 tablets (40 mEq total) by mouth daily. 07/07/20 08/06/20  Johnson, Clanford L, MD  sertraline (ZOLOFT) 50 MG tablet Take 50 mg by mouth at bedtime.     [provider]  tamsulosin (FLOMAX) 0.4 MG CAPS capsule Take 0.4 mg by mouth daily.    [provider]  traMADol (ULTRAM) 50 MG tablet Take 50 mg by mouth 3 (three) times daily as needed for moderate pain.  03/29/20   [provider]    warfarin (COUMADIN) 2.5 MG tablet Take 1-2 tablets (2.5-5 mg total) by mouth See admin instructions. Mon,Wed,Fri take 5mg   2.5mg  all other days 07/07/20   Murlean Iba, MD    Family History    Family History  Problem Relation Age of Onset  . Hypertension Mother   . Diabetes Mother   . Hypertension Father   . Diabetes Father    She indicated that her mother is alive. She indicated that her father is alive. She indicated that her sister is alive. She indicated that her brother is alive.  Social History    Social History   Socioeconomic History  . Marital status: Married    Spouse name: Not on file  . Number of children: Not on file  . Years of education: Not on file  . Highest education level: Not on file  Occupational History  . Not on file  Tobacco Use  . Smoking status: Current Every Day Smoker    Packs/day: 0.75    Types: Cigarettes    Start date: 10/23/1978  . Smokeless tobacco: Never Used  . Tobacco comment: 1/2 pack - trying to cut back   Vaping Use  . Vaping Use: Never used  Substance and Sexual Activity  . Alcohol use: No    Alcohol/week: 0.0 standard drinks  . Drug use: No  . Sexual activity: Not on file  Other Topics Concern  . Not on file  Social History Narrative  . Not on file   Social Determinants of Health   Financial Resource Strain:   . Difficulty of Paying Living Expenses: Not on file  Food Insecurity:   . Worried About Charity fundraiser in the Last Year: Not on file  . Ran Out of Food in the Last Year: Not on file  Transportation Needs:   . Lack of Transportation (Medical): Not on file  . Lack of Transportation (Non-Medical): Not on file  Physical Activity:   . Days of Exercise per Week: Not on file  . Minutes of Exercise per Session: Not on file  Stress:   . Feeling of Stress : Not on file  Social Connections:   . Frequency of Communication with Friends and Family: Not on file  . Frequency of Social Gatherings with Friends and  Family: Not on file  . Attends Religious Services:  Not on file  . Active Member of Clubs or Organizations: Not on file  . Attends Archivist Meetings: Not on file  . Marital Status: Not on file  Intimate Partner Violence:   . Fear of Current or Ex-Partner: Not on file  . Emotionally Abused: Not on file  . Physically Abused: Not on file  . Sexually Abused: Not on file     Review of Systems    General:  No chills, fever, night sweats or weight changes.  Cardiovascular:  No chest pain, dyspnea on exertion, edema, orthopnea, palpitations, paroxysmal nocturnal dyspnea. Dermatological: No rash, lesions/masses Respiratory: No cough, dyspnea Urologic: No hematuria, dysuria Abdominal:   No nausea, vomiting, diarrhea, bright red blood per rectum, melena, or hematemesis Neurologic:  No visual changes, wkns, changes in mental status. All other systems reviewed and are otherwise negative except as noted above.  Physical Exam    VS:  BP 128/70   Pulse 70   Wt 219 lb 9.6 oz (99.6 kg)   SpO2 (!) 83%   BMI 45.90 kg/m  , BMI Body mass index is 45.9 kg/m. GEN: Well nourished, well developed, in no acute distress. HEENT: normal. Neck: Supple, no JVD, carotid bruits, or masses. Cardiac: RRR, no murmurs, rubs, or gallops. No clubbing, cyanosis, bilateral +2 pitting edema knee down.  Radials/DP/PT 2+ and equal bilaterally.  Respiratory:  Respirations regular and unlabored, rhonchorous throughout all lung fields with expiratory wheeze  GI: Soft, nontender, nondistended, BS + x 4. MS: no deformity or atrophy. Skin: warm and dry, no rash. Neuro:  Strength and sensation are intact. Psych: Normal affect.  Accessory Clinical Findings    Recent Labs: 06/25/2020: ALT 29 06/29/2020: TSH 4.443 07/03/2020: B Natriuretic Peptide 480.0 07/05/2020: Hemoglobin 11.3; Platelets 251 07/07/2020: BUN 29; Creatinine, Ser 0.94; Magnesium 1.9; Potassium 4.4; Sodium 136   Recent Lipid Panel      Component Value Date/Time   CHOL 221 (H) 05/05/2020 0928   TRIG 165 (H) 05/05/2020 0928   HDL 71 05/05/2020 0928   CHOLHDL 3.1 05/05/2020 0928   CHOLHDL 3.8 03/24/2018 0451   VLDL 20 03/24/2018 0451   LDLCALC 121 (H) 05/05/2020 0928    ECG personally reviewed by me today- none today.   EKG 06/30/2020 Atrial fibrillation with RVR 119 bpm  Echocardiogram 07/02/2020 IMPRESSIONS    1. Difficult asssessment of LVEF in setting of aflutter as well as  limited visualization. . Left ventricular ejection fraction, by  estimation, is 50%. The left ventricle has mildly decreased function. The  left ventricle demonstrates global hypokinesis.  There is mild left ventricular hypertrophy. Left ventricular diastolic  parameters are indeterminate.  2. Right ventricular systolic function was not well visualized. The right  ventricular size is not well visualized.  3. Left atrial size was mildly dilated.  4. The mitral valve is normal in structure. No evidence of mitral valve  regurgitation. No evidence of mitral stenosis.  5. The aortic valve is tricuspid. Aortic valve regurgitation is not  visualized. No aortic stenosis is present.  6. The inferior vena cava is normal in size with greater than 50%  respiratory variability, suggesting right atrial pressure of 3 mmHg.   Assessment & Plan   1.  Acute on chronic respiratory failure with hypoxia -has been coughing yellow purulent for the past several days.  Oxygen saturation 83% on 4 L nasal cannula.  Reports compliance with diuretics.  Recent course of antibiotics. Case reviewed with Dr. Domenic Polite DOD. We will transfer  to emergency department for CXR and diuresis.    New onset atrial flutter with RVR-heart rate today 70.  Initiated amiodarone during hospitalization along with digoxin and Lopressor.  Patient had already been on anticoagulation which was therapeutic. Continue amiodarone, digoxin, metoprolol Heart healthy low-sodium  diet-salty 6 given Avoid triggers caffeine, chocolate, EtOH etc. Increase physical activity as tolerated  Acute on chronic systolic and diastolic CHF-euvolemic today.  Weight today 219.9 pounds.  Weight at discharge 213.3 pounds. Continue Bumex, metoprolol, digoxin, amiodarone Heart healthy low-sodium diet-salty 6 given Increase physical activity as tolerated Daily weights-contact office with a weight increase of 3 pounds overnight or 5 pounds in 1 week. Lower extremity support stockings  Coronary artery disease-no chest pain today.  CABG times 10/2005.  Troponins elevated during recent hospitalization, felt to be related to demand ischemia. Continue metoprolol, nitroglycerin Heart healthy low-sodium diet-salty 6 given Increase physical activity as tolerated  Hyperlipidemia-05/05/2020: Cholesterol, Total 221; HDL 71; LDL Chol Calc (NIH) 121; Triglycerides 165 Heart healthy low-sodium high-fiber diet Increase physical activity as tolerated  Disposition: Follow-up with Dr. Percival Spanish or App in 1 week.   Jossie Ng. Maverick Dieudonne NP-C    07/21/2020, 11:02 AM [place] [address] Office [phone] Fax [phone]  Notice: This dictation was prepared with Dragon dictation along with smaller phrase technology. Any transcriptional errors that result from this process are unintentional and may not be corrected upon review.

## 2020-07-21 ENCOUNTER — Encounter: Payer: Self-pay | Admitting: General Practice

## 2020-07-21 ENCOUNTER — Encounter (HOSPITAL_COMMUNITY): Payer: Self-pay

## 2020-07-21 ENCOUNTER — Inpatient Hospital Stay (HOSPITAL_COMMUNITY)
Admission: EM | Admit: 2020-07-21 | Discharge: 2020-07-29 | DRG: 291 | Disposition: A | Discharge status: Home / Self Care | Payer: Medicare Other | Attending: Internal Medicine | Admitting: Internal Medicine

## 2020-07-21 ENCOUNTER — Other Ambulatory Visit: Payer: Self-pay

## 2020-07-21 ENCOUNTER — Ambulatory Visit (INDEPENDENT_AMBULATORY_CARE_PROVIDER_SITE_OTHER): Payer: Medicare Other | Admitting: General Practice

## 2020-07-21 ENCOUNTER — Emergency Department (HOSPITAL_COMMUNITY): Payer: Medicare Other

## 2020-07-21 VITALS — BP 128/70 | HR 70 | Wt 219.6 lb

## 2020-07-21 DIAGNOSIS — Z881 Allergy status to other antibiotic agents status: Secondary | ICD-10-CM

## 2020-07-21 DIAGNOSIS — F32A Depression, unspecified: Secondary | ICD-10-CM | POA: Diagnosis not present

## 2020-07-21 DIAGNOSIS — R0902 Hypoxemia: Secondary | ICD-10-CM

## 2020-07-21 DIAGNOSIS — Z833 Family history of diabetes mellitus: Secondary | ICD-10-CM | POA: Diagnosis not present

## 2020-07-21 DIAGNOSIS — I4892 Unspecified atrial flutter: Secondary | ICD-10-CM | POA: Diagnosis present

## 2020-07-21 DIAGNOSIS — J9621 Acute and chronic respiratory failure with hypoxia: Secondary | ICD-10-CM

## 2020-07-21 DIAGNOSIS — I5042 Chronic combined systolic (congestive) and diastolic (congestive) heart failure: Secondary | ICD-10-CM | POA: Diagnosis not present

## 2020-07-21 DIAGNOSIS — I252 Old myocardial infarction: Secondary | ICD-10-CM | POA: Diagnosis not present

## 2020-07-21 DIAGNOSIS — Z20822 Contact with and (suspected) exposure to covid-19: Secondary | ICD-10-CM | POA: Diagnosis present

## 2020-07-21 DIAGNOSIS — Z6841 Body Mass Index (BMI) 40.0 and over, adult: Secondary | ICD-10-CM

## 2020-07-21 DIAGNOSIS — D6862 Lupus anticoagulant syndrome: Secondary | ICD-10-CM | POA: Diagnosis present

## 2020-07-21 DIAGNOSIS — J441 Chronic obstructive pulmonary disease with (acute) exacerbation: Secondary | ICD-10-CM | POA: Diagnosis present

## 2020-07-21 DIAGNOSIS — I517 Cardiomegaly: Secondary | ICD-10-CM | POA: Diagnosis not present

## 2020-07-21 DIAGNOSIS — R7889 Finding of other specified substances, not normally found in blood: Secondary | ICD-10-CM

## 2020-07-21 DIAGNOSIS — A4151 Sepsis due to Escherichia coli [E. coli]: Secondary | ICD-10-CM | POA: Diagnosis not present

## 2020-07-21 DIAGNOSIS — Z7951 Long term (current) use of inhaled steroids: Secondary | ICD-10-CM | POA: Diagnosis not present

## 2020-07-21 DIAGNOSIS — I2510 Atherosclerotic heart disease of native coronary artery without angina pectoris: Secondary | ICD-10-CM | POA: Diagnosis present

## 2020-07-21 DIAGNOSIS — Z794 Long term (current) use of insulin: Secondary | ICD-10-CM

## 2020-07-21 DIAGNOSIS — E785 Hyperlipidemia, unspecified: Secondary | ICD-10-CM | POA: Diagnosis present

## 2020-07-21 DIAGNOSIS — J811 Chronic pulmonary edema: Secondary | ICD-10-CM | POA: Diagnosis not present

## 2020-07-21 DIAGNOSIS — I10 Essential (primary) hypertension: Secondary | ICD-10-CM | POA: Diagnosis present

## 2020-07-21 DIAGNOSIS — Z86718 Personal history of other venous thrombosis and embolism: Secondary | ICD-10-CM | POA: Diagnosis not present

## 2020-07-21 DIAGNOSIS — Z880 Allergy status to penicillin: Secondary | ICD-10-CM

## 2020-07-21 DIAGNOSIS — Z87891 Personal history of nicotine dependence: Secondary | ICD-10-CM

## 2020-07-21 DIAGNOSIS — Z8249 Family history of ischemic heart disease and other diseases of the circulatory system: Secondary | ICD-10-CM | POA: Diagnosis not present

## 2020-07-21 DIAGNOSIS — Z930 Tracheostomy status: Secondary | ICD-10-CM | POA: Diagnosis not present

## 2020-07-21 DIAGNOSIS — G4733 Obstructive sleep apnea (adult) (pediatric): Secondary | ICD-10-CM | POA: Diagnosis present

## 2020-07-21 DIAGNOSIS — L409 Psoriasis, unspecified: Secondary | ICD-10-CM | POA: Diagnosis not present

## 2020-07-21 DIAGNOSIS — I272 Pulmonary hypertension, unspecified: Secondary | ICD-10-CM | POA: Diagnosis present

## 2020-07-21 DIAGNOSIS — Z9981 Dependence on supplemental oxygen: Secondary | ICD-10-CM | POA: Diagnosis not present

## 2020-07-21 DIAGNOSIS — J9811 Atelectasis: Secondary | ICD-10-CM | POA: Diagnosis not present

## 2020-07-21 DIAGNOSIS — Z8541 Personal history of malignant neoplasm of cervix uteri: Secondary | ICD-10-CM | POA: Diagnosis not present

## 2020-07-21 DIAGNOSIS — J9620 Acute and chronic respiratory failure, unspecified whether with hypoxia or hypercapnia: Secondary | ICD-10-CM | POA: Diagnosis present

## 2020-07-21 DIAGNOSIS — K8050 Calculus of bile duct without cholangitis or cholecystitis without obstruction: Secondary | ICD-10-CM | POA: Diagnosis not present

## 2020-07-21 DIAGNOSIS — J9622 Acute and chronic respiratory failure with hypercapnia: Secondary | ICD-10-CM | POA: Diagnosis present

## 2020-07-21 DIAGNOSIS — I5043 Acute on chronic combined systolic (congestive) and diastolic (congestive) heart failure: Secondary | ICD-10-CM | POA: Diagnosis present

## 2020-07-21 DIAGNOSIS — Z886 Allergy status to analgesic agent status: Secondary | ICD-10-CM

## 2020-07-21 DIAGNOSIS — J9601 Acute respiratory failure with hypoxia: Secondary | ICD-10-CM

## 2020-07-21 DIAGNOSIS — Z951 Presence of aortocoronary bypass graft: Secondary | ICD-10-CM | POA: Diagnosis not present

## 2020-07-21 DIAGNOSIS — Z9114 Patient's other noncompliance with medication regimen: Secondary | ICD-10-CM | POA: Diagnosis not present

## 2020-07-21 DIAGNOSIS — Z7901 Long term (current) use of anticoagulants: Secondary | ICD-10-CM | POA: Diagnosis not present

## 2020-07-21 DIAGNOSIS — I110 Hypertensive heart disease with heart failure: Principal | ICD-10-CM | POA: Diagnosis present

## 2020-07-21 DIAGNOSIS — Z79899 Other long term (current) drug therapy: Secondary | ICD-10-CM

## 2020-07-21 DIAGNOSIS — E1165 Type 2 diabetes mellitus with hyperglycemia: Secondary | ICD-10-CM

## 2020-07-21 DIAGNOSIS — J90 Pleural effusion, not elsewhere classified: Secondary | ICD-10-CM | POA: Diagnosis not present

## 2020-07-21 DIAGNOSIS — Z79891 Long term (current) use of opiate analgesic: Secondary | ICD-10-CM | POA: Diagnosis not present

## 2020-07-21 DIAGNOSIS — I5023 Acute on chronic systolic (congestive) heart failure: Secondary | ICD-10-CM | POA: Diagnosis not present

## 2020-07-21 DIAGNOSIS — I509 Heart failure, unspecified: Secondary | ICD-10-CM

## 2020-07-21 DIAGNOSIS — Z720 Tobacco use: Secondary | ICD-10-CM

## 2020-07-21 DIAGNOSIS — R0602 Shortness of breath: Secondary | ICD-10-CM | POA: Diagnosis not present

## 2020-07-21 DIAGNOSIS — Z955 Presence of coronary angioplasty implant and graft: Secondary | ICD-10-CM

## 2020-07-21 DIAGNOSIS — R791 Abnormal coagulation profile: Secondary | ICD-10-CM

## 2020-07-21 DIAGNOSIS — F17210 Nicotine dependence, cigarettes, uncomplicated: Secondary | ICD-10-CM | POA: Diagnosis present

## 2020-07-21 DIAGNOSIS — T380X5A Adverse effect of glucocorticoids and synthetic analogues, initial encounter: Secondary | ICD-10-CM | POA: Diagnosis present

## 2020-07-21 DIAGNOSIS — I480 Paroxysmal atrial fibrillation: Secondary | ICD-10-CM | POA: Diagnosis not present

## 2020-07-21 DIAGNOSIS — R001 Bradycardia, unspecified: Secondary | ICD-10-CM | POA: Diagnosis present

## 2020-07-21 DIAGNOSIS — Z8674 Personal history of sudden cardiac arrest: Secondary | ICD-10-CM | POA: Diagnosis not present

## 2020-07-21 DIAGNOSIS — E876 Hypokalemia: Secondary | ICD-10-CM | POA: Diagnosis not present

## 2020-07-21 LAB — TROPONIN I (HIGH SENSITIVITY)
Troponin I (High Sensitivity): 17 ng/L (ref <18)
Troponin I (High Sensitivity): 17 ng/L (ref <18)

## 2020-07-21 LAB — CBC WITH DIFFERENTIAL/PLATELET
Abs Immature Granulocytes: 0.03 10*3/uL (ref 0.00–0.07)
Basophils Absolute: 0 10*3/uL (ref 0.0–0.1)
Basophils Relative: 0 %
Eosinophils Absolute: 0.2 10*3/uL (ref 0.0–0.5)
Eosinophils Relative: 3 %
HCT: 38.5 % (ref 36.0–46.0)
Hemoglobin: 11.8 g/dL — ABNORMAL LOW (ref 12.0–15.0)
Immature Granulocytes: 0 %
Lymphocytes Relative: 9 %
Lymphs Abs: 0.6 10*3/uL — ABNORMAL LOW (ref 0.7–4.0)
MCH: 31.5 pg (ref 26.0–34.0)
MCHC: 30.6 g/dL (ref 30.0–36.0)
MCV: 102.7 fL — ABNORMAL HIGH (ref 80.0–100.0)
Monocytes Absolute: 0.6 10*3/uL (ref 0.1–1.0)
Monocytes Relative: 8 %
Neutro Abs: 5.4 10*3/uL (ref 1.7–7.7)
Neutrophils Relative %: 80 %
Platelets: 244 10*3/uL (ref 150–400)
RBC: 3.75 MIL/uL — ABNORMAL LOW (ref 3.87–5.11)
RDW: 17.7 % — ABNORMAL HIGH (ref 11.5–15.5)
WBC: 6.8 10*3/uL (ref 4.0–10.5)
nRBC: 0 % (ref 0.0–0.2)

## 2020-07-21 LAB — BASIC METABOLIC PANEL
Anion gap: 11 (ref 5–15)
BUN: 17 mg/dL (ref 6–20)
CO2: 34 mmol/L — ABNORMAL HIGH (ref 22–32)
Calcium: 8.6 mg/dL — ABNORMAL LOW (ref 8.9–10.3)
Chloride: 92 mmol/L — ABNORMAL LOW (ref 98–111)
Creatinine, Ser: 1.12 mg/dL — ABNORMAL HIGH (ref 0.44–1.00)
GFR, Estimated: 58 mL/min — ABNORMAL LOW (ref >60)
Glucose, Bld: 229 mg/dL — ABNORMAL HIGH (ref 70–99)
Potassium: 4.7 mmol/L (ref 3.5–5.1)
Sodium: 137 mmol/L (ref 135–145)

## 2020-07-21 LAB — PROTIME-INR
INR: 4.2 (ref 0.8–1.2)
INR: 3.8 — ABNORMAL HIGH (ref 0.8–1.2)
Prothrombin Time: 39.6 seconds — ABNORMAL HIGH (ref 11.4–15.2)
Prothrombin Time: 36 seconds — ABNORMAL HIGH (ref 11.4–15.2)

## 2020-07-21 LAB — RESPIRATORY PANEL BY RT PCR (FLU A&B, COVID)
Influenza A by PCR: NEGATIVE
Influenza B by PCR: NEGATIVE
SARS Coronavirus 2 by RT PCR: NEGATIVE

## 2020-07-21 LAB — LACTIC ACID, PLASMA
Lactic Acid, Venous: 2.9 mmol/L (ref 0.5–1.9)
Lactic Acid, Venous: 1.7 mmol/L (ref 0.5–1.9)

## 2020-07-21 LAB — GLUCOSE, CAPILLARY: Glucose-Capillary: 267 mg/dL — ABNORMAL HIGH (ref 70–99)

## 2020-07-21 LAB — DIGOXIN LEVEL: Digoxin Level: 2.5 ng/mL (ref 1.0–2.0)

## 2020-07-21 LAB — BRAIN NATRIURETIC PEPTIDE: B Natriuretic Peptide: 444 pg/mL — ABNORMAL HIGH (ref 0.0–100.0)

## 2020-07-21 MED ORDER — TRAMADOL HCL 50 MG PO TABS
50.0000 mg | ORAL_TABLET | Freq: Three times a day (TID) | ORAL | Status: DC | PRN
  Administered 2020-07-23 (×2): 50 mg via ORAL
  Filled 2020-07-21 (×2): qty 1

## 2020-07-21 MED ORDER — BUDESONIDE 0.5 MG/2ML IN SUSP
2.0000 mg | Freq: Four times a day (QID) | RESPIRATORY_TRACT | Status: DC
  Administered 2020-07-21: 0.5 mg via RESPIRATORY_TRACT
  Filled 2020-07-21: qty 8

## 2020-07-21 MED ORDER — ACETAMINOPHEN 650 MG RE SUPP: 650.0000 mg | Freq: Four times a day (QID) | RECTAL | Status: DC | PRN

## 2020-07-21 MED ORDER — ACETAMINOPHEN 325 MG PO TABS
650.0000 mg | ORAL_TABLET | Freq: Four times a day (QID) | ORAL | Status: DC | PRN
  Administered 2020-07-23 – 2020-07-28 (×3): 650 mg via ORAL
  Filled 2020-07-21 (×3): qty 2

## 2020-07-21 MED ORDER — INSULIN ASPART 100 UNIT/ML ~~LOC~~ SOLN
0.0000 [IU] | Freq: Three times a day (TID) | SUBCUTANEOUS | Status: DC
  Administered 2020-07-22: 7 [IU] via SUBCUTANEOUS
  Administered 2020-07-22: 4 [IU] via SUBCUTANEOUS
  Administered 2020-07-22: 11 [IU] via SUBCUTANEOUS
  Administered 2020-07-23: 7 [IU] via SUBCUTANEOUS
  Administered 2020-07-23: 11 [IU] via SUBCUTANEOUS
  Administered 2020-07-23: 3 [IU] via SUBCUTANEOUS
  Administered 2020-07-24 (×2): 7 [IU] via SUBCUTANEOUS
  Administered 2020-07-24: 20 [IU] via SUBCUTANEOUS
  Administered 2020-07-25: 3 [IU] via SUBCUTANEOUS
  Administered 2020-07-25: 11 [IU] via SUBCUTANEOUS
  Administered 2020-07-25: 4 [IU] via SUBCUTANEOUS
  Administered 2020-07-26 (×2): 11 [IU] via SUBCUTANEOUS
  Administered 2020-07-26 – 2020-07-27 (×2): 3 [IU] via SUBCUTANEOUS
  Administered 2020-07-27: 11 [IU] via SUBCUTANEOUS
  Administered 2020-07-28: 15 [IU] via SUBCUTANEOUS
  Administered 2020-07-28: 20 [IU] via SUBCUTANEOUS

## 2020-07-21 MED ORDER — POTASSIUM CHLORIDE CRYS ER 20 MEQ PO TBCR
40.0000 meq | EXTENDED_RELEASE_TABLET | Freq: Every day | ORAL | Status: DC
  Administered 2020-07-22 – 2020-07-29 (×8): 40 meq via ORAL
  Filled 2020-07-21 (×8): qty 2

## 2020-07-21 MED ORDER — ALBUTEROL (5 MG/ML) CONTINUOUS INHALATION SOLN
10.0000 mg/h | INHALATION_SOLUTION | RESPIRATORY_TRACT | Status: DC
  Administered 2020-07-21: 10 mg/h via RESPIRATORY_TRACT
  Filled 2020-07-21: qty 20

## 2020-07-21 MED ORDER — PANTOPRAZOLE SODIUM 20 MG PO TBEC
20.0000 mg | DELAYED_RELEASE_TABLET | Freq: Every day | ORAL | Status: DC
  Filled 2020-07-21 (×3): qty 1

## 2020-07-21 MED ORDER — AEROCHAMBER PLUS FLO-VU MEDIUM MISC
1.0000 | Freq: Once | Status: AC
  Administered 2020-07-21: 1
  Filled 2020-07-21: qty 1

## 2020-07-21 MED ORDER — TAMSULOSIN HCL 0.4 MG PO CAPS
0.4000 mg | ORAL_CAPSULE | Freq: Every day | ORAL | Status: DC
  Administered 2020-07-22 – 2020-07-29 (×8): 0.4 mg via ORAL
  Filled 2020-07-21 (×8): qty 1

## 2020-07-21 MED ORDER — DOXYCYCLINE HYCLATE 100 MG PO TABS
100.0000 mg | ORAL_TABLET | Freq: Two times a day (BID) | ORAL | Status: AC
  Administered 2020-07-21 – 2020-07-26 (×10): 100 mg via ORAL
  Filled 2020-07-21 (×10): qty 1

## 2020-07-21 MED ORDER — AMIODARONE HCL 200 MG PO TABS: 200.0000 mg | ORAL_TABLET | Freq: Two times a day (BID) | ORAL | Status: DC

## 2020-07-21 MED ORDER — FUROSEMIDE 10 MG/ML IJ SOLN
60.0000 mg | Freq: Once | INTRAMUSCULAR | Status: AC
  Administered 2020-07-21: 60 mg via INTRAVENOUS
  Filled 2020-07-21: qty 6

## 2020-07-21 MED ORDER — PREDNISONE 20 MG PO TABS
40.0000 mg | ORAL_TABLET | Freq: Every day | ORAL | Status: DC
  Administered 2020-07-22: 40 mg via ORAL
  Filled 2020-07-21: qty 2

## 2020-07-21 MED ORDER — FUROSEMIDE 10 MG/ML IJ SOLN
60.0000 mg | Freq: Two times a day (BID) | INTRAMUSCULAR | Status: DC
  Administered 2020-07-22 (×2): 60 mg via INTRAVENOUS
  Filled 2020-07-21 (×2): qty 6

## 2020-07-21 MED ORDER — ALBUTEROL SULFATE HFA 108 (90 BASE) MCG/ACT IN AERS
4.0000 | INHALATION_SPRAY | Freq: Once | RESPIRATORY_TRACT | Status: AC
  Administered 2020-07-21: 4 via RESPIRATORY_TRACT
  Filled 2020-07-21: qty 6.7

## 2020-07-21 MED ORDER — SODIUM CHLORIDE 0.9 % IV SOLN
INTRAVENOUS | Status: DC | PRN
  Administered 2020-07-21: 250 mL via INTRAVENOUS

## 2020-07-21 MED ORDER — ONDANSETRON HCL 4 MG/2ML IJ SOLN
4.0000 mg | Freq: Once | INTRAMUSCULAR | Status: AC
  Administered 2020-07-21: 4 mg via INTRAVENOUS
  Filled 2020-07-21: qty 2

## 2020-07-21 MED ORDER — AMIODARONE HCL 200 MG PO TABS
200.0000 mg | ORAL_TABLET | Freq: Two times a day (BID) | ORAL | Status: AC
  Administered 2020-07-21 – 2020-07-23 (×5): 200 mg via ORAL
  Filled 2020-07-21 (×5): qty 1

## 2020-07-21 MED ORDER — WARFARIN - PHARMACIST DOSING INPATIENT: Freq: Every day | Status: DC

## 2020-07-21 MED ORDER — POLYETHYLENE GLYCOL 3350 17 G PO PACK: 17.0000 g | PACK | Freq: Every day | ORAL | Status: DC | PRN

## 2020-07-21 MED ORDER — AMIODARONE HCL 200 MG PO TABS
200.0000 mg | ORAL_TABLET | Freq: Every day | ORAL | Status: DC
  Administered 2020-07-24 – 2020-07-29 (×6): 200 mg via ORAL
  Filled 2020-07-21 (×6): qty 1

## 2020-07-21 MED ORDER — ONDANSETRON HCL 4 MG PO TABS: 4.0000 mg | ORAL_TABLET | Freq: Four times a day (QID) | ORAL | Status: DC | PRN

## 2020-07-21 MED ORDER — INSULIN ASPART 100 UNIT/ML ~~LOC~~ SOLN
0.0000 [IU] | Freq: Every day | SUBCUTANEOUS | Status: DC
  Administered 2020-07-21: 3 [IU] via SUBCUTANEOUS
  Administered 2020-07-22: 2 [IU] via SUBCUTANEOUS
  Administered 2020-07-27: 5 [IU] via SUBCUTANEOUS
  Administered 2020-07-28: 2 [IU] via SUBCUTANEOUS

## 2020-07-21 MED ORDER — SERTRALINE HCL 50 MG PO TABS
50.0000 mg | ORAL_TABLET | Freq: Every day | ORAL | Status: DC
  Administered 2020-07-21 – 2020-07-28 (×8): 50 mg via ORAL
  Filled 2020-07-21 (×8): qty 1

## 2020-07-21 MED ORDER — METHYLPREDNISOLONE SODIUM SUCC 125 MG IJ SOLR
60.0000 mg | Freq: Two times a day (BID) | INTRAMUSCULAR | Status: DC
  Administered 2020-07-22: 60 mg via INTRAVENOUS
  Filled 2020-07-21: qty 2

## 2020-07-21 MED ORDER — LORAZEPAM 0.5 MG PO TABS
0.5000 mg | ORAL_TABLET | Freq: Four times a day (QID) | ORAL | Status: DC | PRN
  Administered 2020-07-24 – 2020-07-27 (×6): 0.5 mg via ORAL
  Filled 2020-07-21 (×6): qty 1

## 2020-07-21 MED ORDER — LEVOFLOXACIN IN D5W 750 MG/150ML IV SOLN
750.0000 mg | Freq: Once | INTRAVENOUS | Status: AC
  Administered 2020-07-21: 750 mg via INTRAVENOUS
  Filled 2020-07-21: qty 150

## 2020-07-21 MED ORDER — GABAPENTIN 300 MG PO CAPS
900.0000 mg | ORAL_CAPSULE | Freq: Every day | ORAL | Status: DC
  Administered 2020-07-21 – 2020-07-28 (×8): 900 mg via ORAL
  Filled 2020-07-21 (×8): qty 3

## 2020-07-21 MED ORDER — ONDANSETRON HCL 4 MG/2ML IJ SOLN: 4.0000 mg | Freq: Four times a day (QID) | INTRAMUSCULAR | Status: DC | PRN

## 2020-07-21 MED ORDER — BUDESONIDE 0.5 MG/2ML IN SUSP
1.0000 mg | Freq: Two times a day (BID) | RESPIRATORY_TRACT | Status: DC
  Administered 2020-07-22 – 2020-07-28 (×14): 1 mg via RESPIRATORY_TRACT
  Filled 2020-07-21 (×16): qty 4

## 2020-07-21 MED ORDER — INSULIN GLARGINE 100 UNIT/ML ~~LOC~~ SOLN
30.0000 [IU] | Freq: Every day | SUBCUTANEOUS | Status: DC
  Administered 2020-07-21 – 2020-07-22 (×2): 30 [IU] via SUBCUTANEOUS
  Filled 2020-07-21 (×3): qty 0.3

## 2020-07-21 MED ORDER — INSULIN DEGLUDEC 100 UNIT/ML ~~LOC~~ SOPN: 30.0000 [IU] | PEN_INJECTOR | Freq: Every day | SUBCUTANEOUS | Status: DC

## 2020-07-21 MED ORDER — DEXAMETHASONE SODIUM PHOSPHATE 10 MG/ML IJ SOLN
10.0000 mg | Freq: Once | INTRAMUSCULAR | Status: AC
  Administered 2020-07-21: 10 mg via INTRAVENOUS
  Filled 2020-07-21: qty 1

## 2020-07-21 MED ORDER — ALBUTEROL SULFATE (2.5 MG/3ML) 0.083% IN NEBU: 2.5000 mg | INHALATION_SOLUTION | RESPIRATORY_TRACT | Status: DC | PRN

## 2020-07-21 MED ORDER — IPRATROPIUM-ALBUTEROL 0.5-2.5 (3) MG/3ML IN SOLN
3.0000 mL | Freq: Four times a day (QID) | RESPIRATORY_TRACT | Status: DC
  Administered 2020-07-21 – 2020-07-25 (×17): 3 mL via RESPIRATORY_TRACT
  Filled 2020-07-21 (×16): qty 3

## 2020-07-21 NOTE — ED Notes (Signed)
Pt's oxygen dropped to 83%. Oxygen tank ran out of oxygen. New oxygen tank replaced. RT call and suctioned pt. Oxygen stabilized

## 2020-07-21 NOTE — Patient Instructions (Signed)
Patient escorted via wheelchair to [facility] ED, room 19. Report given to charge nurse,Leslie.

## 2020-07-21 NOTE — ED Triage Notes (Signed)
Pt brought over for [facility].  Staff reports pt was there today for her 2 week follow up from chf admission.  Staff repots pt coughing up yellow sputum from trach, c/o sob, and o2 sat 80's.

## 2020-07-21 NOTE — Progress Notes (Signed)
ANTICOAGULATION CONSULT NOTE - Initial Consult  Pharmacy Consult for Warfarin Indication: History of VTE  Allergies  Allergen Reactions  . Penicillins Rash    Has patient had a PCN reaction causing immediate rash, facial/tongue/throat swelling, SOB or lightheadedness with hypotension: Yes Has patient had a PCN reaction causing severe rash involving mucus membranes or skin necrosis: No Has patient had a PCN reaction that required hospitalization No Has patient had a PCN reaction occurring within the last 10 years: No If all of the above answers are "NO", then may proceed with Cephalosporin use.   REACTION: rash Pt has tolerated cefepime in the past.  . Ciprofloxacin     nausea  . Ibuprofen Rash    swelling in leg     Patient Measurements: Height: 4\' 10"  (147.3 cm) Weight: 97.9 kg (215 lb 13.3 oz) IBW/kg (Calculated) : 40.9  Vital Signs: Temp: 98.8 F (37.1 C) (11/15 1848) Temp Source: Oral (11/15 1848) BP: 127/61 (11/15 1854) Pulse Rate: 63 (11/15 1854)  Labs: Recent Labs    07/21/20 1152 07/21/20 1347  HGB 11.8*  --   HCT 38.5  --   PLT 244  --   LABPROT 39.6*  --   INR 4.2*  --   CREATININE 1.12*  --   TROPONINIHS 17 17    Estimated Creatinine Clearance: 57.7 mL/min (A) (by C-G formula based on SCr of 1.12 mg/dL (H)).   Medical History: Past Medical History:  Diagnosis Date  . Acute kidney failure with lesion of tubular necrosis (HCC)   . Acute systolic heart failure ([place])   . CAD (coronary artery disease)    a. s/p prior PCI. b. CABG 2007 at [facility] in [place] 2007. c. inferior STEMI 10/2015 s/p DES to dSVG-PDA.  . Cardiac arrest ([place])   . Cervical cancer ([place])   . Chronic diastolic CHF (congestive heart failure) ([place])   . Chronic respiratory failure ([place])    s/p tracheostomy 2002  . Chronic RUQ pain   . COPD (chronic obstructive pulmonary disease) ([place])   . Diabetes mellitus ([place])   . DVT (deep venous thrombosis) ([place])   .  Endometriosis   . History of gallstones 01/2016   seen on Ultrasound  . History of HIDA scan 11/2016   normal  . HTN (hypertension)   . Hyperlipidemia   . Lupus anticoagulant disorder (HCC)    on coumadin  . Morbid obesity ([place])   . Psoriasis   . ST elevation (STEMI) myocardial infarction involving right coronary artery ([place]) 10/29/15   stent to VG to PDA  . Toe fracture, right 03/29/2018  . Tracheostomy in place [facility]), chronic since 2002 11/03/2015    Assessment: 54 yr old female presented to [facility] ED with SOB. Medical hx includes lupus anticoagulant with hx of VTE, for which she is on anticoagulation with warfarin PTA (5 mg MWF, 2.5 mg other days of the week; per med rec, last dose was on the evening of 07/20/20). Pt also has recent dx of atrial flutter.  H/H 11.8/38.5, platelets 244; INR on admission was 4.2 (but had note re: INR being corrected from previously-reported value of 4.3).  Goal of Therapy:  INR 2-3 Monitor platelets by anticoagulation protocol: Yes   Plan:  Repeat INR; pharmacist will evaluate INR to determine if warfarin should be given this evening Monitor daily INR, CBC Monitor for signs/symptoms of bleeding  Gillermina Hu, PharmD, BCPS, FCCP [employer] 07/21/2020,7:10 PM

## 2020-07-21 NOTE — Progress Notes (Signed)
ANTICOAGULATION CONSULT NOTE - Initial Consult  Pharmacy Consult for Warfarin Indication: History of VTE  Allergies  Allergen Reactions  . Penicillins Rash    Has patient had a PCN reaction causing immediate rash, facial/tongue/throat swelling, SOB or lightheadedness with hypotension: Yes Has patient had a PCN reaction causing severe rash involving mucus membranes or skin necrosis: No Has patient had a PCN reaction that required hospitalization No Has patient had a PCN reaction occurring within the last 10 years: No If all of the above answers are "NO", then may proceed with Cephalosporin use.   REACTION: rash Pt has tolerated cefepime in the past.  . Ciprofloxacin     nausea  . Ibuprofen Rash    swelling in leg     Patient Measurements: Height: 4\' 10"  (147.3 cm) Weight: 97.9 kg (215 lb 13.3 oz) IBW/kg (Calculated) : 40.9  Vital Signs: Temp: 98.8 F (37.1 C) (11/15 1848) Temp Source: Oral (11/15 1848) BP: 127/61 (11/15 1854) Pulse Rate: 60 (11/15 2019)  Labs: Recent Labs    07/21/20 1152 07/21/20 1347 07/21/20 2121  HGB 11.8*  --   --   HCT 38.5  --   --   PLT 244  --   --   LABPROT 39.6*  --  36.0*  INR 4.2*  --  3.8*  CREATININE 1.12*  --   --   TROPONINIHS 17 17  --     Estimated Creatinine Clearance: 57.7 mL/min (A) (by C-G formula based on SCr of 1.12 mg/dL (H)).   Medical History: Past Medical History:  Diagnosis Date  . Acute kidney failure with lesion of tubular necrosis (HCC)   . Acute systolic heart failure ([place])   . CAD (coronary artery disease)    a. s/p prior PCI. b. CABG 2007 at [facility] in [place] 2007. c. inferior STEMI 10/2015 s/p DES to dSVG-PDA.  . Cardiac arrest ([place])   . Cervical cancer ([place])   . Chronic diastolic CHF (congestive heart failure) ([place])   . Chronic respiratory failure ([place])    s/p tracheostomy 2002  . Chronic RUQ pain   . COPD (chronic obstructive pulmonary disease) ([place])   . Diabetes mellitus ([place])   .  DVT (deep venous thrombosis) ([place])   . Endometriosis   . History of gallstones 01/2016   seen on Ultrasound  . History of HIDA scan 11/2016   normal  . HTN (hypertension)   . Hyperlipidemia   . Lupus anticoagulant disorder (HCC)    on coumadin  . Morbid obesity ([place])   . Psoriasis   . ST elevation (STEMI) myocardial infarction involving right coronary artery ([place]) 10/29/15   stent to VG to PDA  . Toe fracture, right 03/29/2018  . Tracheostomy in place [facility]), chronic since 2002 11/03/2015    Assessment: 54 yr old female presented to [facility] ED with SOB. Medical hx includes lupus anticoagulant with hx of VTE, for which she is on anticoagulation with warfarin PTA (5 mg MWF, 2.5 mg other days of the week; per med rec, last dose was on the evening of 07/20/20). Pt also has recent dx of atrial flutter.  H/H 11.8/38.5, platelets 244; INR on admission was 4.2 (but had note re: INR being corrected from previously-reported value of 4.3). Repeat INR 3.8 is supratherapeutic  Goal of Therapy:  INR 2-3 Monitor platelets by anticoagulation protocol: Yes   Plan:  Hold warfarin tonight Monitor daily INR, CBC Monitor for signs/symptoms of bleeding  Cristela Felt, PharmD  Clinical Pharmacist  07/21/2020,10:19 PM

## 2020-07-21 NOTE — H&P (Signed)
History and Physical    Theresa [name]:[id number] DOB: [birth year] DOA: 07/21/2020  PCP: Monico Blitz, MD  Patient coming from: Home  I have personally briefly reviewed patient's old medical records in [place]  Chief Complaint: Shortness of breath  HPI: Theresa [name] is a 54 y.o. female with medical history significant of combined CHF, COPD, chronic respiratory failure, tracheostomy dependent, lupus anticoagulant with history of VTE on chronic anticoagulation therapy, was recently discharged from hospital on 11/1 after being treated for decompensated CHF.  Patient reports compliance with medications.  She had come back to cardiology office today as hospital follow-up and was noted to have increased weight.  She reports feeling generally weak, feels that her legs are heavier.  She also describes abdominal distention.  She has felt more short of breath.  She has had worsening cough productive of yellowish sputum.  She has noticed that she is wheezing more.  No chest pain.  No fever.  No vomiting.  No abdominal pain.  No diarrhea.  Review of records indicate that her weight is up approximately 6 pounds since her last discharge.  While in the cardiology office, she was noted to be hypoxic at 83% on chronic 4 L of oxygen.  She was sent to the emergency room for evaluation  ED Course: Patient was sent to the emergency room from cardiology clinic for further evaluation.  Hypoxia was confirmed.  Chest x-ray indicated CHF.  She received a dose of IV Lasix.  She has been referred for admission.  Review of Systems:  Review of Systems  Constitutional: Negative for chills and fever.  HENT: Positive for congestion and sinus pain.   Eyes: Negative for blurred vision and double vision.  Respiratory: Positive for cough, sputum production, shortness of breath and wheezing.   Cardiovascular: Positive for leg swelling. Negative for chest pain and palpitations.  Gastrointestinal: Negative for  abdominal pain, diarrhea, nausea and vomiting.  Genitourinary: Negative for dysuria.  Musculoskeletal: Positive for back pain and joint pain.  Neurological: Positive for weakness. Negative for dizziness and loss of consciousness.      Past Medical History:  Diagnosis Date  . Acute kidney failure with lesion of tubular necrosis (HCC)   . Acute systolic heart failure ([place])   . CAD (coronary artery disease)    a. s/p prior PCI. b. CABG 2007 at [facility] in [place] 2007. c. inferior STEMI 10/2015 s/p DES to dSVG-PDA.  . Cardiac arrest ([place])   . Cervical cancer ([place])   . Chronic diastolic CHF (congestive heart failure) ([place])   . Chronic respiratory failure ([place])    s/p tracheostomy 2002  . Chronic RUQ pain   . COPD (chronic obstructive pulmonary disease) ([place])   . Diabetes mellitus ([place])   . DVT (deep venous thrombosis) ([place])   . Endometriosis   . History of gallstones 01/2016   seen on Ultrasound  . History of HIDA scan 11/2016   normal  . HTN (hypertension)   . Hyperlipidemia   . Lupus anticoagulant disorder (HCC)    on coumadin  . Morbid obesity ([place])   . Psoriasis   . ST elevation (STEMI) myocardial infarction involving right coronary artery ([place]) 10/29/15   stent to VG to PDA  . Toe fracture, right 03/29/2018  . Tracheostomy in place [facility]), chronic since 2002 11/03/2015    Past Surgical History:  Procedure Laterality Date  . CARDIAC CATHETERIZATION N/A 10/29/2015   Procedure: Left Heart Cath and Cors/Grafts Angiography;  Surgeon: Burnell Blanks, MD;  Location: [place] [facility];  Service: Cardiovascular;  Laterality: N/A;  . CARDIAC CATHETERIZATION  10/29/2015   Procedure: Coronary Stent Intervention;  Surgeon: Burnell Blanks, MD;  Location: [place] [facility];  Service: Cardiovascular;;  . CAROTID STENT    . CESAREAN SECTION WITH BILATERAL TUBAL LIGATION    . CORONARY ARTERY BYPASS GRAFT  2007   2V  . IR GASTROSTOMY TUBE MOD SED  11/13/2018  .  RIGHT/LEFT HEART CATH AND CORONARY/GRAFT ANGIOGRAPHY N/A 03/24/2018   Procedure: RIGHT/LEFT HEART CATH AND CORONARY/GRAFT ANGIOGRAPHY;  Surgeon: Belva Crome, MD;  Location: [place] [facility];  Service: Cardiovascular;  Laterality: N/A;  . TRACHEOSTOMY      Social History:  reports that she has been smoking cigarettes. She started smoking about 41 years ago. She has been smoking about 0.75 packs per day. She has never used smokeless tobacco. She reports that she does not drink alcohol and does not use drugs.  Allergies  Allergen Reactions  . Penicillins Rash    Has patient had a PCN reaction causing immediate rash, facial/tongue/throat swelling, SOB or lightheadedness with hypotension: Yes Has patient had a PCN reaction causing severe rash involving mucus membranes or skin necrosis: No Has patient had a PCN reaction that required hospitalization No Has patient had a PCN reaction occurring within the last 10 years: No If all of the above answers are "NO", then may proceed with Cephalosporin use.   REACTION: rash Pt has tolerated cefepime in the past.  . Ciprofloxacin     nausea  . Ibuprofen Rash    swelling in leg     Family History  Problem Relation Age of Onset  . Hypertension Mother   . Diabetes Mother   . Hypertension Father   . Diabetes Father      Prior to Admission medications   Medication Sig Start Date End Date Taking? Authorizing Provider  albuterol (PROVENTIL) (2.5 MG/3ML) 0.083% nebulizer solution Take 3 mLs (2.5 mg total) by nebulization every 4 (four) hours as needed for wheezing. 07/07/20  Yes Johnson, Clanford L, MD  albuterol (VENTOLIN HFA) 108 (90 Base) MCG/ACT inhaler Inhale 2 puffs into the lungs every 4 (four) hours as needed for wheezing or shortness of breath.   Yes [provider]  amiodarone (PACERONE) 200 MG tablet Take 2 tabs po BID x 4 days, then 1 tab BID x 14 days, then 1 po daily 07/07/20  Yes Johnson, Clanford L, MD  bumetanide (BUMEX)  2 MG tablet Take 2 mg by mouth daily. Takes 6 mg am and 4 mg pm   Yes [provider]  digoxin (LANOXIN) 0.125 MG tablet Take 1 tablet (0.125 mg total) by mouth daily. 07/08/20  Yes Johnson, Clanford L, MD  gabapentin (NEURONTIN) 300 MG capsule Take 3 capsules (900 mg total) by mouth at bedtime. 07/07/20  Yes Johnson, Clanford L, MD  insulin degludec (TRESIBA FLEXTOUCH) 100 UNIT/ML FlexTouch Pen Inject 30 Units into the skin at bedtime. 07/07/20  Yes Johnson, Clanford L, MD  LORazepam (ATIVAN) 0.5 MG tablet Take 0.5 mg by mouth every 6 (six) hours as needed for anxiety.    Yes [provider]  metoprolol tartrate (LOPRESSOR) 25 MG tablet Take 1 tablet (25 mg total) by mouth 2 (two) times daily. Patient taking differently: Take 12.5 mg by mouth 2 (two) times daily. Take 1/2 tablet in the morning and 1/2 tablet in the evening 07/07/20  Yes Johnson, Clanford L,  MD  mometasone (ELOCON) 0.1 % ointment Apply 1 application topically daily as needed. 06/24/20  Yes [provider]  nitroGLYCERIN (NITROSTAT) 0.4 MG SL tablet Place 0.4 mg under the tongue every 5 (five) minutes as needed for chest pain.   Yes [provider]  NOVOLOG FLEXPEN 100 UNIT/ML FlexPen Inject 1-10 Units into the skin 3 (three) times daily before meals. 04/06/20  Yes [provider]  OZEMPIC, 0.25 OR 0.5 MG/DOSE, 2 MG/1.5ML SOPN Inject 0.5 mg into the skin every Wednesday.  04/08/19  Yes [provider]  pantoprazole (PROTONIX) 20 MG tablet Take 20 mg by mouth daily. 02/16/20  Yes [provider]  polyethylene glycol (MIRALAX / GLYCOLAX) 17 g packet Take 17 g by mouth daily as needed for mild constipation. 06/20/20  Yes Kathie Dike, MD  potassium chloride SA (KLOR-CON) 20 MEQ tablet Take 2 tablets (40 mEq total) by mouth daily. 07/07/20 08/06/20 Yes Johnson, Clanford L, MD  Pseudoeph-Doxylamine-DM-APAP (NYQUIL PO) Take 20 mLs by mouth daily.   Yes [provider]    sertraline (ZOLOFT) 50 MG tablet Take 50 mg by mouth at bedtime.    Yes [provider]  tamsulosin (FLOMAX) 0.4 MG CAPS capsule Take 0.4 mg by mouth daily.   Yes [provider]  traMADol (ULTRAM) 50 MG tablet Take 50 mg by mouth 3 (three) times daily as needed for moderate pain.  03/29/20  Yes [provider]  warfarin (COUMADIN) 2.5 MG tablet Take 1-2 tablets (2.5-5 mg total) by mouth See admin instructions. Mon,Wed,Fri take 5mg   2.5mg  all other days 07/07/20  Yes Johnson, Clanford L, MD  cephALEXin (KEFLEX) 500 MG capsule Take 1 capsule (500 mg total) by mouth every 8 (eight) hours. Patient not taking: Reported on 07/21/2020 06/20/20   Kathie Dike, MD    Physical Exam: Vitals:   07/21/20 1630 07/21/20 1700 07/21/20 1730 07/21/20 1800  BP: 119/71 126/70 99/75 (!) 108/56  Pulse: 61 61 60 63  Resp: (!) 23 (!) 22 19 17   SpO2: 93%  93% 92%  Weight:      Height:        Constitutional: NAD, calm, comfortable Eyes: PERRL, lids and conjunctivae normal ENMT: Mucous membranes are moist. Posterior pharynx clear of any exudate or lesions.Normal dentition.  Neck: normal, supple, no masses, no thyromegaly Respiratory: clear to auscultation bilaterally, no wheezing, no crackles. Normal respiratory effort. No accessory muscle use.  Cardiovascular: Regular rate and rhythm, no murmurs / rubs / gallops. No extremity edema. 2+ pedal pulses. No carotid bruits.  Abdomen: no tenderness, no masses palpated. No hepatosplenomegaly. Bowel sounds positive.  Musculoskeletal: no clubbing / cyanosis. No joint deformity upper and lower extremities. Good ROM, no contractures. Normal muscle tone.  Skin: no rashes, lesions, ulcers. No induration Neurologic: CN 2-12 grossly intact. Sensation intact, DTR normal. Strength 5/5 in all 4.  Psychiatric: Normal judgment and insight. Alert and oriented x 3. Normal mood.    Labs on Admission: I have personally reviewed following labs and  imaging studies  CBC: Recent Labs  Lab 07/21/20 1152  WBC 6.8  NEUTROABS 5.4  HGB 11.8*  HCT 38.5  MCV 102.7*  PLT 161   Basic Metabolic Panel: Recent Labs  Lab 07/21/20 1152  NA 137  K 4.7  CL 92*  CO2 34*  GLUCOSE 229*  BUN 17  CREATININE 1.12*  CALCIUM 8.6*   GFR: Estimated Creatinine Clearance: 59.5 mL/min (A) (by C-G formula based on SCr of 1.12 mg/dL (H)).  Liver Function Tests: No results for input(s): AST, ALT, ALKPHOS, BILITOT, PROT, ALBUMIN in the last 168 hours. No results for input(s): LIPASE, AMYLASE in the last 168 hours. No results for input(s): AMMONIA in the last 168 hours. Coagulation Profile: Recent Labs  Lab 07/21/20 1152  INR 4.2*   Cardiac Enzymes: No results for input(s): CKTOTAL, CKMB, CKMBINDEX, TROPONINI in the last 168 hours. BNP (last 3 results) No results for input(s): PROBNP in the last 8760 hours. HbA1C: No results for input(s): HGBA1C in the last 72 hours. CBG: No results for input(s): GLUCAP in the last 168 hours. Lipid Profile: No results for input(s): CHOL, HDL, LDLCALC, TRIG, CHOLHDL, LDLDIRECT in the last 72 hours. Thyroid Function Tests: No results for input(s): TSH, T4TOTAL, FREET4, T3FREE, THYROIDAB in the last 72 hours. Anemia Panel: No results for input(s): VITAMINB12, FOLATE, FERRITIN, TIBC, IRON, RETICCTPCT in the last 72 hours. Urine analysis:    Component Value Date/Time   COLORURINE AMBER (A) 06/15/2020 0119   APPEARANCEUR CLEAR 06/15/2020 0119   LABSPEC 1.014 06/15/2020 0119   PHURINE 6.0 06/15/2020 0119   GLUCOSEU NEGATIVE 06/15/2020 0119   HGBUR NEGATIVE 06/15/2020 0119   BILIRUBINUR SMALL (A) 06/15/2020 0119   KETONESUR NEGATIVE 06/15/2020 0119   PROTEINUR 30 (A) 06/15/2020 0119   NITRITE NEGATIVE 06/15/2020 0119   LEUKOCYTESUR NEGATIVE 06/15/2020 0119    Radiological Exams on Admission: DG Chest Port 1 View  Result Date: 07/21/2020 CLINICAL DATA:  Shortness of breath EXAM: PORTABLE CHEST 1 VIEW  COMPARISON:  07/03/2020 FINDINGS: Tracheostomy tube remains in place. Post CABG changes. Stable cardiomegaly. There is pulmonary vascular congestion with diffuse bilateral interstitial prominence. Streaky perihilar and bibasilar opacities. No large pleural fluid collection. No pneumothorax. IMPRESSION: Findings suggestive of CHF with pulmonary edema. Streaky perihilar and bibasilar opacities may represent atelectasis, alveolar edema, versus infection. Electronically Signed   By: Davina Poke D.O.   On: 07/21/2020 11:46    EKG: Independently reviewed. Sinus rhythm without acute changes  Assessment/Plan Active Problems:   Essential hypertension   COPD exacerbation (HCC)   Lupus anticoagulant syndrome (HCC)   Tracheostomy in place [facility]), chronic since 2002   Uncontrolled type 2 diabetes mellitus with hyperglycemia, with long-term current use of insulin (HCC)   Acute on chronic combined systolic and diastolic HF (heart failure) (HCC)   Acute on chronic respiratory failure (HCC)     Acute on chronic respiratory failure with hypoxia -Chronically on 4 L of oxygen via nasal cannula -Currently requiring 10 L via trach collar -Continue to wean down oxygen as tolerated  Acute on chronic combined CHF -Most recent echocardiogram from 07/02/2020 shows ejection fraction 50% -Patient reports compliance with medications -Was recently started on twice daily Bumex -We will use IV Lasix for now and monitor urine output  COPD exacerbation -She does have wheezing and cough -Start on bronchodilators -Antibiotics with ceftriaxone -We will start on systemic steroids  Type 2 diabetes -Continue on basal insulin -Start on sliding scale coverage  Lupus anticoagulant syndrome with history of VTE -Continue anticoagulation with warfarin  Recent diagnosis of atrial flutter -Continue on amiodarone -She is anticoagulated -She is also on digoxin with supratherapeutic levels.  Will hold further  digoxin   DVT prophylaxis: Coumadin Code Status: Full code Family Communication: Discussed with patient Disposition Plan: Discharge home once respiratory status has stabilized Consults called:   Admission status:  inpatient, stepdown   Kathie Dike MD [facility]   If 7PM-7AM, please contact night-coverage [email or web address]   07/21/2020, 6:25  PM

## 2020-07-21 NOTE — ED Provider Notes (Signed)
[facility] EMERGENCY DEPARTMENT Provider Note   CSN: [id number] Arrival date & time: 07/21/20  1118     History Chief Complaint  Patient presents with  . Shortness of Breath    Theresa [name] is a 54 y.o. female.  Pt presents to the ED today with sob.  The pt has a hx of essential hypertension, CAD, DVT, chronic systolic and CHF, pulmonary hypertension, atrial flutter with RVR, OSA, COPD, cholelithiasis, lupus anticoagulant syndrome, hyperlipidemia, CABG, and tobacco abuse.  She was admitted from 10/20-11/1 for CHF.  She has gained 14 pounds since d/c.  She was following up today at the CHF clinic after her admission.  While there she was noted to be hypoxic.  She was 83% on 4L.  She was sent over here.  Pt has been coughing up yellow sputum.  No fever.  She has been compliant with meds.  She has not been vaccinated against Covid. She does have a chronic trach since 2012.         Past Medical History:  Diagnosis Date  . Acute kidney failure with lesion of tubular necrosis (HCC)   . Acute systolic heart failure ([place])   . CAD (coronary artery disease)    a. s/p prior PCI. b. CABG 2007 at [facility] in [place] 2007. c. inferior STEMI 10/2015 s/p DES to dSVG-PDA.  . Cardiac arrest ([place])   . Cervical cancer ([place])   . Chronic diastolic CHF (congestive heart failure) ([place])   . Chronic respiratory failure ([place])    s/p tracheostomy 2002  . Chronic RUQ pain   . COPD (chronic obstructive pulmonary disease) ([place])   . Diabetes mellitus ([place])   . DVT (deep venous thrombosis) ([employer])   . Endometriosis   . History of gallstones 01/2016   seen on Ultrasound  . History of HIDA scan 11/2016   normal  . HTN (hypertension)   . Hyperlipidemia   . Lupus anticoagulant disorder (HCC)    on coumadin  . Morbid obesity ([place])   . Psoriasis   . ST elevation (STEMI) myocardial infarction involving right coronary artery ([place]) 10/29/15   stent to VG to PDA  . Toe fracture, right  03/29/2018  . Tracheostomy in place [facility]), chronic since 2002 11/03/2015    Patient Active Problem List   Diagnosis Date Noted  . Hypoxemia   . Atrial flutter with rapid ventricular response ([place])   . Sepsis due to Escherichia coli (E. coli) ([place]) 06/16/2020  . Bacteremia due to Gram-negative bacteria 06/16/2020  . Severe sepsis ([place]) 06/15/2020  . Biliary colic 56/81/2751  . Abdominal pain 06/15/2020  . Transaminitis 06/15/2020  . Sepsis due to undetermined organism ([place]) 06/15/2020  . Systolic and diastolic CHF, chronic ([place]) 06/15/2020  . CHF (congestive heart failure) ([place]) 04/18/2020  . Acute respiratory failure ([place]) 04/17/2020  . Chronic venous insufficiency 06/19/2019  . Cholelithiasis 05/07/2019  . Adult body mass index 50.0-59.9 ([place]) 03/22/2019  . Anxiety 03/22/2019  . Low back pain 03/22/2019  . Cigarette nicotine dependence without complication 70/09/7492  . History of vitamin D deficiency 03/22/2019  . Insomnia 03/22/2019  . Knee pain 03/22/2019  . Localized, primary osteoarthritis of lower leg, unspecified laterality 03/22/2019  . Neck pain 03/22/2019  . Psoriasis 03/22/2019  . Thrombophlebitis of deep veins of lower extremity ([place]) 03/22/2019  . DVT (deep venous thrombosis) ([place]) 03/22/2019  . Fracture of humeral shaft, left, closed 03/22/2019  . Chronic respiratory insufficiency 01/19/2019  . CAD (coronary artery  disease) 01/15/2019  . Educated about COVID-19 virus infection 01/12/2019  . Acute on chronic combined systolic and diastolic HF (heart failure) ([place]) 01/12/2019  . Acute on chronic respiratory failure with hypoxia ([place]) 10/24/2018  . Pulmonary hypertension, unspecified ([place])   . Acute on chronic respiratory failure with hypoxia ([place]) 08/01/2017  . Uncontrolled type 2 diabetes mellitus with hyperglycemia, with long-term current use of insulin ([place]) 08/01/2017  . Dyspnea 07/31/2017  . Nausea & vomiting   . Acute on chronic congestive heart failure  ([place])   . Acute respiratory failure with hypoxia ([place]) 01/22/2016  . Hyperglycemia due to diabetes mellitus ([place]) 01/22/2016  . CAD (coronary artery disease) of artery bypass graft: PTCA/DES to distal body VG to PDA 10/29/15 11/03/2015  . Tracheostomy in place [facility]), chronic since 2002 11/03/2015  . Hypokalemia 11/03/2015  . Lupus anticoagulant syndrome ([place])   . Chronic diastolic CHF (congestive heart failure) ([place])   . Respiratory failure ([place])   . Coronary artery disease involving coronary bypass graft of native heart without angina pectoris   . OSA (obstructive sleep apnea)   . COPD exacerbation ([place])   . Tobacco abuse   . DM (diabetes mellitus) ([place]) 06/11/2009  . Hyperlipidemia LDL goal <70 06/11/2009  . Acute thromboembolism of deep veins of lower extremity ([place]) 06/11/2009  . History of CABG 06/11/2009  . Essential hypertension 05/27/2009  . CAD, NATIVE VESSEL 05/27/2009    Past Surgical History:  Procedure Laterality Date  . CARDIAC CATHETERIZATION N/A 10/29/2015   Procedure: Left Heart Cath and Cors/Grafts Angiography;  Surgeon: Burnell Blanks, MD;  Location: [facility];  Service: Cardiovascular;  Laterality: N/A;  . CARDIAC CATHETERIZATION  10/29/2015   Procedure: Coronary Stent Intervention;  Surgeon: Burnell Blanks, MD;  Location: [place] [facility];  Service: Cardiovascular;;  . CAROTID STENT    . CESAREAN SECTION WITH BILATERAL TUBAL LIGATION    . CORONARY ARTERY BYPASS GRAFT  2007   2V  . IR GASTROSTOMY TUBE MOD SED  11/13/2018  . RIGHT/LEFT HEART CATH AND CORONARY/GRAFT ANGIOGRAPHY N/A 03/24/2018   Procedure: RIGHT/LEFT HEART CATH AND CORONARY/GRAFT ANGIOGRAPHY;  Surgeon: Belva Crome, MD;  Location: [place] [facility];  Service: Cardiovascular;  Laterality: N/A;  . TRACHEOSTOMY       OB History   No obstetric history on file.     Family History  Problem Relation Age of Onset  . Hypertension Mother   . Diabetes Mother   . Hypertension  Father   . Diabetes Father     Social History   Tobacco Use  . Smoking status: Current Every Day Smoker    Packs/day: 0.75    Types: Cigarettes    Start date: 10/23/1978  . Smokeless tobacco: Never Used  . Tobacco comment: 1/2 pack - trying to cut back   Vaping Use  . Vaping Use: Never used  Substance Use Topics  . Alcohol use: No    Alcohol/week: 0.0 standard drinks  . Drug use: No    Home Medications Prior to Admission medications   Medication Sig Start Date End Date Taking? Authorizing Provider  albuterol (PROVENTIL) (2.5 MG/3ML) 0.083% nebulizer solution Take 3 mLs (2.5 mg total) by nebulization every 4 (four) hours as needed for wheezing. 07/07/20  Yes Johnson, Clanford L, MD  albuterol (VENTOLIN HFA) 108 (90 Base) MCG/ACT inhaler Inhale 2 puffs into the lungs every 4 (four) hours as needed for wheezing or shortness of breath.   Yes [provider]  amiodarone (PACERONE) 200 MG tablet Take 2 tabs po BID x 4 days, then 1 tab BID x 14 days, then 1 po daily 07/07/20  Yes Johnson, Clanford L, MD  bumetanide (BUMEX) 2 MG tablet Take 2 mg by mouth daily. Takes 6 mg am and 4 mg pm   Yes [provider]  digoxin (LANOXIN) 0.125 MG tablet Take 1 tablet (0.125 mg total) by mouth daily. 07/08/20  Yes Johnson, Clanford L, MD  gabapentin (NEURONTIN) 300 MG capsule Take 3 capsules (900 mg total) by mouth at bedtime. 07/07/20  Yes Johnson, Clanford L, MD  insulin degludec (TRESIBA FLEXTOUCH) 100 UNIT/ML FlexTouch Pen Inject 30 Units into the skin at bedtime. 07/07/20  Yes Johnson, Clanford L, MD  LORazepam (ATIVAN) 0.5 MG tablet Take 0.5 mg by mouth every 6 (six) hours as needed for anxiety.    Yes [provider]  metoprolol tartrate (LOPRESSOR) 25 MG tablet Take 1 tablet (25 mg total) by mouth 2 (two) times daily. Patient taking differently: Take 12.5 mg by mouth 2 (two) times daily. Take 1/2 tablet in the morning and 1/2 tablet in the evening 07/07/20  Yes Johnson,  Clanford L, MD  mometasone (ELOCON) 0.1 % ointment Apply 1 application topically daily as needed. 06/24/20  Yes [provider]  nitroGLYCERIN (NITROSTAT) 0.4 MG SL tablet Place 0.4 mg under the tongue every 5 (five) minutes as needed for chest pain.   Yes [provider]  NOVOLOG FLEXPEN 100 UNIT/ML FlexPen Inject 1-10 Units into the skin 3 (three) times daily before meals. 04/06/20  Yes [provider]  OZEMPIC, 0.25 OR 0.5 MG/DOSE, 2 MG/1.5ML SOPN Inject 0.5 mg into the skin every Wednesday.  04/08/19  Yes [provider]  pantoprazole (PROTONIX) 20 MG tablet Take 20 mg by mouth daily. 02/16/20  Yes [provider]  polyethylene glycol (MIRALAX / GLYCOLAX) 17 g packet Take 17 g by mouth daily as needed for mild constipation. 06/20/20  Yes Kathie Dike, MD  potassium chloride SA (KLOR-CON) 20 MEQ tablet Take 2 tablets (40 mEq total) by mouth daily. 07/07/20 08/06/20 Yes Johnson, Clanford L, MD  Pseudoeph-Doxylamine-DM-APAP (NYQUIL PO) Take 20 mLs by mouth daily.   Yes [provider]  sertraline (ZOLOFT) 50 MG tablet Take 50 mg by mouth at bedtime.    Yes [provider]  tamsulosin (FLOMAX) 0.4 MG CAPS capsule Take 0.4 mg by mouth daily.   Yes [provider]  traMADol (ULTRAM) 50 MG tablet Take 50 mg by mouth 3 (three) times daily as needed for moderate pain.  03/29/20  Yes [provider]  warfarin (COUMADIN) 2.5 MG tablet Take 1-2 tablets (2.5-5 mg total) by mouth See admin instructions. Mon,Wed,Fri take 5mg   2.5mg  all other days 07/07/20  Yes Johnson, Clanford L, MD  cephALEXin (KEFLEX) 500 MG capsule Take 1 capsule (500 mg total) by mouth every 8 (eight) hours. Patient not taking: Reported on 07/21/2020 06/20/20   Kathie Dike, MD    Allergies    Penicillins, Ciprofloxacin, and Ibuprofen  Review of Systems   Review of Systems  Respiratory: Positive for cough and shortness of breath.   Neurological: Positive  for weakness.  All other systems reviewed and are negative.   Physical Exam Updated Vital Signs BP 103/75   Pulse 62   Resp (!) 24   Ht 4\' 11"  (1.499 m)   Wt 99.3 kg   SpO2 94%   BMI 44.23 kg/m   Physical Exam Vitals  and nursing note reviewed.  Constitutional:      General: She is in acute distress.     Appearance: She is ill-appearing.  HENT:     Head: Normocephalic and atraumatic.     Mouth/Throat:     Mouth: Mucous membranes are moist.  Eyes:     Extraocular Movements: Extraocular movements intact.     Pupils: Pupils are equal, round, and reactive to light.  Cardiovascular:     Rate and Rhythm: Normal rate and regular rhythm.  Pulmonary:     Effort: Tachypnea present.     Breath sounds: Wheezing, rhonchi and rales present.  Abdominal:     General: Bowel sounds are normal.     Palpations: Abdomen is soft.  Musculoskeletal:        General: Normal range of motion.     Cervical back: Normal range of motion and neck supple.     Right lower leg: Edema present.     Left lower leg: Edema present.  Skin:    General: Skin is warm.     Capillary Refill: Capillary refill takes less than 2 seconds.  Neurological:     General: No focal deficit present.     Mental Status: She is alert and oriented to person, place, and time.  Psychiatric:        Mood and Affect: Mood normal.        Behavior: Behavior normal.     ED Results / Procedures / Treatments   Labs (all labs ordered are listed, but only abnormal results are displayed) Labs Reviewed  BASIC METABOLIC PANEL - Abnormal; Notable for the following components:      Result Value   Chloride 92 (*)    CO2 34 (*)    Glucose, Bld 229 (*)    Creatinine, Ser 1.12 (*)    Calcium 8.6 (*)    GFR, Estimated 58 (*)    All other components within normal limits  BRAIN NATRIURETIC PEPTIDE - Abnormal; Notable for the following components:   B Natriuretic Peptide 444.0 (*)    All other components within normal limits  CBC WITH  DIFFERENTIAL/PLATELET - Abnormal; Notable for the following components:   RBC 3.75 (*)    Hemoglobin 11.8 (*)    MCV 102.7 (*)    RDW 17.7 (*)    Lymphs Abs 0.6 (*)    All other components within normal limits  DIGOXIN LEVEL - Abnormal; Notable for the following components:   Digoxin Level 2.5 (*)    All other components within normal limits  PROTIME-INR - Abnormal; Notable for the following components:   Prothrombin Time 39.6 (*)    INR 4.2 (*)    All other components within normal limits  LACTIC ACID, PLASMA - Abnormal; Notable for the following components:   Lactic Acid, Venous 2.9 (*)    All other components within normal limits  CULTURE, BLOOD (ROUTINE X 2)  CULTURE, BLOOD (ROUTINE X 2)  RESPIRATORY PANEL BY RT PCR (FLU A&B, COVID)  CULTURE, RESPIRATORY  LACTIC ACID, PLASMA  TROPONIN I (HIGH SENSITIVITY)  TROPONIN I (HIGH SENSITIVITY)    EKG EKG Interpretation  Date/Time:  Monday July 21 2020 11:42:54 EST Ventricular Rate:  61 PR Interval:    QRS Duration: 98 QT Interval:  378 QTC Calculation: 378 R Axis:   45 Text Interpretation: Sinus rhythm Low voltage, extremity and precordial leads Repol abnrm suggests ischemia, diffuse leads Baseline wander in lead(s) V3 Confirmed by Isla Pence [phone]) on 07/21/2020  11:49:16 AM   Radiology DG Chest Port 1 View  Result Date: 07/21/2020 CLINICAL DATA:  Shortness of breath EXAM: PORTABLE CHEST 1 VIEW COMPARISON:  07/03/2020 FINDINGS: Tracheostomy tube remains in place. Post CABG changes. Stable cardiomegaly. There is pulmonary vascular congestion with diffuse bilateral interstitial prominence. Streaky perihilar and bibasilar opacities. No large pleural fluid collection. No pneumothorax. IMPRESSION: Findings suggestive of CHF with pulmonary edema. Streaky perihilar and bibasilar opacities may represent atelectasis, alveolar edema, versus infection. Electronically Signed   By: Davina Poke D.O.   On: 07/21/2020 11:46     Procedures Procedures (including critical care time)  Medications Ordered in ED Medications  albuterol (PROVENTIL,VENTOLIN) solution continuous neb (has no administration in time range)  levofloxacin (LEVAQUIN) IVPB 750 mg (750 mg Intravenous New Bag/Given 07/21/20 1524)  0.9 %  sodium chloride infusion (250 mLs Intravenous New Bag/Given 07/21/20 1523)  dexamethasone (DECADRON) injection 10 mg (10 mg Intravenous Given 07/21/20 1309)  albuterol (VENTOLIN HFA) 108 (90 Base) MCG/ACT inhaler 4 puff (4 puffs Inhalation Given 07/21/20 1142)  AeroChamber Plus Flo-Vu Medium MISC 1 each (1 each Other Given by Other 07/21/20 1423)  furosemide (LASIX) injection 60 mg (60 mg Intravenous Given 07/21/20 1312)  ondansetron (ZOFRAN) injection 4 mg (4 mg Intravenous Given 07/21/20 1520)    ED Course  I have reviewed the triage vital signs and the nursing notes.  Pertinent labs & imaging results that were available during my care of the patient were reviewed by me and considered in my medical decision making (see chart for details).    MDM Rules/Calculators/A&P                          Pt put on a trach collar at 10L plus the 4L oxygen via [place].  O2 sats are now in the mid 90s.  Pt given iv decadron and iv lasix.  She is given 4 puffs of inhaler.  After her covid came back, she is given a continuous neb.  Pt is looking better, but still feels weak and tired and sob.  Pt's INR and dig level are elevated.  She is not bleeding and she is not bradycardic.  No need for reversal agents.  There were a few low O2 sats documented.  The oxygen tank ran out and had to be changed.  RT also had to suction her. Tracheal aspirate cx sent.  No definite pneumonia, but due to cough with productive sputum, Levaquin (pcn allergic) started.  Pt d/w Dr. Roderic Palau (triad) for admission.  CRITICAL CARE Performed by: Isla Pence   Total critical care time: 45 minutes  Critical care time was exclusive of separately  billable procedures and treating other patients.  Critical care was necessary to treat or prevent imminent or life-threatening deterioration.  Critical care was time spent personally by me on the following activities: development of treatment plan with patient and/or surrogate as well as nursing, discussions with consultants, evaluation of patient's response to treatment, examination of patient, obtaining history from patient or surrogate, ordering and performing treatments and interventions, ordering and review of laboratory studies, ordering and review of radiographic studies, pulse oximetry and re-evaluation of patient's condition.  Final Clinical Impression(s) / ED Diagnoses Final diagnoses:  Acute respiratory failure with hypoxia (HCC)  Acute on chronic congestive heart failure, unspecified heart failure type (HCC)  COPD exacerbation (HCC)  Supratherapeutic INR  Tobacco abuse  Elevated digoxin level    Rx / DC Orders ED Discharge  Orders    None       Isla Pence, MD 07/21/20 1529

## 2020-07-21 NOTE — Addendum Note (Signed)
Addended by: Deberah Pelton on: 07/21/2020 02:14 PM   Modules accepted: Level of Service

## 2020-07-22 DIAGNOSIS — J9621 Acute and chronic respiratory failure with hypoxia: Secondary | ICD-10-CM | POA: Diagnosis not present

## 2020-07-22 DIAGNOSIS — R7889 Finding of other specified substances, not normally found in blood: Secondary | ICD-10-CM | POA: Diagnosis not present

## 2020-07-22 DIAGNOSIS — I5043 Acute on chronic combined systolic (congestive) and diastolic (congestive) heart failure: Secondary | ICD-10-CM | POA: Diagnosis not present

## 2020-07-22 DIAGNOSIS — J441 Chronic obstructive pulmonary disease with (acute) exacerbation: Secondary | ICD-10-CM | POA: Diagnosis not present

## 2020-07-22 LAB — BASIC METABOLIC PANEL
Anion gap: 9 (ref 5–15)
BUN: 21 mg/dL — ABNORMAL HIGH (ref 6–20)
CO2: 37 mmol/L — ABNORMAL HIGH (ref 22–32)
Calcium: 8.7 mg/dL — ABNORMAL LOW (ref 8.9–10.3)
Chloride: 92 mmol/L — ABNORMAL LOW (ref 98–111)
Creatinine, Ser: 1.22 mg/dL — ABNORMAL HIGH (ref 0.44–1.00)
GFR, Estimated: 53 mL/min — ABNORMAL LOW (ref >60)
Glucose, Bld: 300 mg/dL — ABNORMAL HIGH (ref 70–99)
Potassium: 4.8 mmol/L (ref 3.5–5.1)
Sodium: 138 mmol/L (ref 135–145)

## 2020-07-22 LAB — GLUCOSE, CAPILLARY
Glucose-Capillary: 252 mg/dL — ABNORMAL HIGH (ref 70–99)
Glucose-Capillary: 249 mg/dL — ABNORMAL HIGH (ref 70–99)
Glucose-Capillary: 193 mg/dL — ABNORMAL HIGH (ref 70–99)
Glucose-Capillary: 213 mg/dL — ABNORMAL HIGH (ref 70–99)

## 2020-07-22 LAB — PROTIME-INR
INR: 3.6 — ABNORMAL HIGH (ref 0.8–1.2)
Prothrombin Time: 34.7 seconds — ABNORMAL HIGH (ref 11.4–15.2)

## 2020-07-22 MED ORDER — CHLORHEXIDINE GLUCONATE CLOTH 2 % EX PADS
6.0000 | MEDICATED_PAD | Freq: Every day | CUTANEOUS | Status: DC
  Administered 2020-07-22 – 2020-07-29 (×7): 6 via TOPICAL

## 2020-07-22 MED ORDER — METHYLPREDNISOLONE SODIUM SUCC 125 MG IJ SOLR
60.0000 mg | Freq: Two times a day (BID) | INTRAMUSCULAR | Status: DC
  Administered 2020-07-23 – 2020-07-26 (×6): 60 mg via INTRAVENOUS
  Filled 2020-07-22 (×7): qty 2

## 2020-07-22 MED ORDER — PANTOPRAZOLE SODIUM 40 MG PO TBEC
40.0000 mg | DELAYED_RELEASE_TABLET | Freq: Every day | ORAL | Status: DC
  Administered 2020-07-22 – 2020-07-29 (×8): 40 mg via ORAL
  Filled 2020-07-22 (×8): qty 1

## 2020-07-22 NOTE — Progress Notes (Addendum)
ANTICOAGULATION CONSULT NOTE - Initial Consult  Pharmacy Consult for Warfarin Indication: History of VTE   Patient Measurements: Height: 4\' 10"  (147.3 cm) Weight: 97.6 kg (215 lb 2.7 oz) IBW/kg (Calculated) : 40.9  Vital Signs: Temp: 98.4 F (36.9 C) (11/16 0711) Temp Source: Oral (11/16 0711) BP: 135/68 (11/16 0711) Pulse Rate: 56 (11/16 0802)  Labs: Recent Labs    07/21/20 1152 07/21/20 1347 07/21/20 2121 07/22/20 0446  HGB 11.8*  --   --   --   HCT 38.5  --   --   --   PLT 244  --   --   --   LABPROT 39.6*  --  36.0* 34.7*  INR 4.2*  --  3.8* 3.6*  CREATININE 1.12*  --   --  1.22*  TROPONINIHS 17 17  --   --     Estimated Creatinine Clearance: 52.9 mL/min (A) (by C-G formula based on SCr of 1.22 mg/dL (H)).  Assessment: 54 yr old female presented [facility] ED with SOB. Medical hx includes lupus anticoagulant with hx of VTE, for which she is on anticoagulation with warfarin PTA (5 mg MWF, 2.5 mg other days of the week; per med rec, last dose was on the evening of 07/20/20). Pt also has recent dx of atrial flutter.   Major Drug Interaction: Target a 25- 50% reduction in weekly warfarin dose after 2 weeks of dual therapy with amiodarone and warfarin  11/16 1000 update: INR: 3.6-->still supra-therapeutic--> possibly d/t amiodarone interaction CBC: Hb 11.8  Plates 244K  (new CBC pending today) RN reports no signs or symptoms of bleeding at this time.  Goal of Therapy:  INR 2-3 Monitor platelets by anticoagulation protocol: Yes   Plan:  Hold warfarin again tonight  for INR of 3.6 Consider weekly dose reduction of warfarin d/t major interaction w/ amiodarone (ie, change to warfarin 2.5mg /day) Daily INR and CBC Monitor for signs/symptoms of bleeding  [name], Pharm. D. Clinical Pharmacist 07/22/2020 10:03 AM

## 2020-07-22 NOTE — TOC Initial Note (Signed)
Transition of Care [facility]) - Initial/Assessment Note    Patient Details  Name: Theresa [name] MRN: [id number] Date of Birth: 03-06-1966  Transition of Care [facility]) CM/SW Contact:    Boneta Lucks, RN Phone Number: 07/22/2020, 12:53 PM  Clinical Narrative:   Pt admitted from [facility] seen them for 2 week follow up for CHF. Patient know to [facility] and completed CHF consult 2 weeks ago. Baseline patient is on 4L 02 through trach collar. Currenlty on 10L.  Pt lives at home with her husband and 2 grandchildren. Pt is able to complete ADL's independently and drives herself. Pt is currently active with [facility] for [facility] PT and RN. TOC will follow for orders at  at D/C for [facility] services.                  Expected Discharge Plan: [place] Barriers to Discharge: Continued Medical Work up   Patient Goals and CMS Choice Patient states their goals for this hospitalization and ongoing recovery are:: to return home with [facility]. CMS Medicare.gov Compare Post Acute Care list provided to:: Patient   Expected Discharge Plan and Services Expected Discharge Plan: [place]    Living arrangements for the past 2 months: Single Family Home                   Prior Living Arrangements/Services Living arrangements for the past 2 months: Single Family Home Lives with:: Spouse, Other (Comment) (grandchildren)     Activities of Daily Living Home Assistive Devices/Equipment: Cane (specify quad or straight), Walker (specify type), Shower chair with back, Bedside commode/3-in-1 ADL Screening (condition at time of admission) Patient's cognitive ability adequate to safely complete daily activities?: Yes Is the patient deaf or have difficulty hearing?: No Does the patient have difficulty seeing, even when wearing glasses/contacts?: No Does the patient have difficulty concentrating, remembering, or making decisions?: No Patient able to express need for assistance with ADLs?:  Yes Does the patient have difficulty dressing or bathing?: Yes Independently performs ADLs?: No Does the patient have difficulty walking or climbing stairs?: Yes Weakness of Legs: Both Weakness of Arms/Hands: None  Emotional Assessment      Alcohol / Substance Use: Not Applicable Psych Involvement: No (comment)  Admission diagnosis:  Tobacco abuse [Z72.0] COPD exacerbation ([place]) [J44.1] Acute respiratory failure with hypoxia ([place]) [J96.01] Supratherapeutic INR [R79.1] Elevated digoxin level [R78.89] Acute on chronic respiratory failure (HCC) [J96.20] Acute on chronic congestive heart failure, unspecified heart failure type ([place]) [I50.9] Patient Active Problem List   Diagnosis Date Noted  . Acute on chronic respiratory failure ([place]) 07/21/2020  . Hypoxemia   . Atrial flutter with rapid ventricular response ([place])   . Sepsis due to Escherichia coli (E. coli) ([place]) 06/16/2020  . Bacteremia due to Gram-negative bacteria 06/16/2020  . Severe sepsis ([place]) 06/15/2020  . Biliary colic 43/32/9518  . Abdominal pain 06/15/2020  . Transaminitis 06/15/2020  . Sepsis due to undetermined organism ([place]) 06/15/2020  . Systolic and diastolic CHF, chronic ([place]) 06/15/2020  . CHF (congestive heart failure) ([place]) 04/18/2020  . Acute respiratory failure ([place]) 04/17/2020  . Chronic venous insufficiency 06/19/2019  . Cholelithiasis 05/07/2019  . Adult body mass index 50.0-59.9 ([place]) 03/22/2019  . Anxiety 03/22/2019  . Low back pain 03/22/2019  . Cigarette nicotine dependence without complication 84/16/6063  . History of vitamin D deficiency 03/22/2019  . Insomnia 03/22/2019  . Knee pain 03/22/2019  . Localized, primary osteoarthritis of lower  leg, unspecified laterality 03/22/2019  . Neck pain 03/22/2019  . Psoriasis 03/22/2019  . Thrombophlebitis of deep veins of lower extremity ([place]) 03/22/2019  . DVT (deep venous thrombosis) ([place]) 03/22/2019  . Fracture of humeral shaft, left, closed  03/22/2019  . Chronic respiratory insufficiency 01/19/2019  . CAD (coronary artery disease) 01/15/2019  . Educated about COVID-19 virus infection 01/12/2019  . Acute on chronic combined systolic and diastolic HF (heart failure) ([place]) 01/12/2019  . Acute on chronic respiratory failure with hypoxia ([place]) 10/24/2018  . Pulmonary hypertension, unspecified ([place])   . Acute on chronic respiratory failure with hypoxia ([place]) 08/01/2017  . Uncontrolled type 2 diabetes mellitus with hyperglycemia, with long-term current use of insulin ([place]) 08/01/2017  . Dyspnea 07/31/2017  . Nausea & vomiting   . Acute on chronic congestive heart failure ([place])   . Acute respiratory failure with hypoxia ([place]) 01/22/2016  . Hyperglycemia due to diabetes mellitus ([place]) 01/22/2016  . CAD (coronary artery disease) of artery bypass graft: PTCA/DES to distal body VG to PDA 10/29/15 11/03/2015  . Tracheostomy in place [facility]), chronic since 2002 11/03/2015  . Hypokalemia 11/03/2015  . Lupus anticoagulant syndrome ([place])   . Chronic diastolic CHF (congestive heart failure) ([place])   . Respiratory failure ([place])   . Coronary artery disease involving coronary bypass graft of native heart without angina pectoris   . OSA (obstructive sleep apnea)   . COPD exacerbation ([place])   . Tobacco abuse   . DM (diabetes mellitus) ([place]) 06/11/2009  . Hyperlipidemia LDL goal <70 06/11/2009  . Acute thromboembolism of deep veins of lower extremity ([place]) 06/11/2009  . History of CABG 06/11/2009  . Essential hypertension 05/27/2009  . CAD, NATIVE VESSEL 05/27/2009   PCP:  Monico Blitz, MD Pharmacy:   [facility] [address], [place] [address] Phone: [phone] Fax: [phone]   Readmission Risk Interventions Readmission Risk Prevention Plan 07/22/2020 05/17/2019  Transportation Screening Complete Complete  PCP or Specialist Appt within 3-5 Days - Complete  HRI or [place] - Complete   Social Work Consult for [place] Planning/Counseling - Complete  Palliative Care Screening - Not Applicable  Medication Review (RN Care Manager) Complete Complete  Palliative Care Screening Not Complete -  [place] Not Applicable -  Some recent data might be hidden

## 2020-07-22 NOTE — Progress Notes (Signed)
Inpatient Diabetes Program Recommendations  AACE/ADA: New Consensus Statement on Inpatient Glycemic Control (2015)  Target Ranges:  Prepandial:   less than 140 mg/dL      Peak postprandial:   less than 180 mg/dL (1-2 hours)      Critically ill patients:  140 - 180 mg/dL   Results for FEY, COGHILL (MRN [id number]) as of 07/22/2020 07:32  Ref. Range 07/21/2020 21:18 07/22/2020 07:11  Glucose-Capillary Latest Ref Range: 70 - 99 mg/dL 267 (H) 252 (H)   Review of Glycemic Control  Diabetes history: DM2 Outpatient Diabetes medications: Tresiba 30 units QHS, Novolog 0-10 units TID, Ozempic 0.5 mg Qweek (Wednesday).  Current orders for Inpatient glycemic control: Novolog 0-20 units TID, Novolog 0-5 HS, Lantus 30 units QHS; Solumedrol 60 Q12, Prednisone 40 QAM  Inpatient Diabetes Program Recommendations:    Insulin: If steroids are continued, please consider increasing Lantus to 35 units QHS and ordering Novolog 5 units TID with meals for meal coverage if patient eats at least 50% of meals.  Thanks, Barnie Alderman, RN, MSN, CDE Diabetes Coordinator Inpatient Diabetes Program [phone] (Team Pager from 8am to 5pm)

## 2020-07-22 NOTE — Progress Notes (Addendum)
PROGRESS NOTE    Theresa [name]  PXT:[id number] DOB: Jun 17, 1966 DOA: 07/21/2020 PCP: Monico Blitz, MD    Brief Narrative:  54 year old female with a history of combined CHF, COPD, chronic respiratory failure, tracheostomy dependent, lupus anticoagulant with history of VTE on chronic anticoagulation, brought to the hospital with shortness of breath.  Admitted for increased weight gain secondary to CHF exacerbation as well as an element of COPD exacerbation.   Assessment & Plan:   Active Problems:   Essential hypertension   COPD exacerbation (HCC)   Lupus anticoagulant syndrome ([place])   Tracheostomy in place [facility]), chronic since 2002   Uncontrolled type 2 diabetes mellitus with hyperglycemia, with long-term current use of insulin (HCC)   Acute on chronic combined systolic and diastolic HF (heart failure) (HCC)   Acute on chronic respiratory failure (HCC)   Acute on chronic respiratory failure with hypoxia -Chronically on 4 L of oxygen via nasal cannula -Currently requiring 10 L via trach collar -Continue to wean down oxygen as tolerated  Acute on chronic combined CHF -Most recent echocardiogram from 07/02/2020 shows ejection fraction 50% -Patient reports compliance with medications -Was recently started on twice daily Bumex -She has been started on IV Lasix, unfortunately intake output has not been accurately recorded -Mild downtrend in weight -She still has evidence of volume overload -Continue to monitor  COPD exacerbation -She continues to have wheezing and cough -Continue on bronchodilators -Antibiotics with doxycycline -Continue IV steroids -Continue pulmonary hygiene  Type 2 diabetes -Continue on basal insulin -Start on sliding scale coverage  Lupus anticoagulant syndrome with history of VTE -Continue anticoagulation with warfarin  Recent diagnosis of atrial flutter -Continue on amiodarone -She is anticoagulated -She is also on digoxin with  supratherapeutic levels.  Will hold further digoxin -Recheck dig level in a.m.   DVT prophylaxis: Coumadin  Code Status: Full code Family Communication: Discussed with husband at the bedside Disposition Plan: Status is: Inpatient  Remains inpatient appropriate because:Inpatient level of care appropriate due to severity of illness   Dispo: The patient is from: Home              Anticipated d/c is to: Home              Anticipated d/c date is: 2 days              Patient currently is not medically stable to d/c.         Consultants:     Procedures:     Antimicrobials:   Doxycycline 11/15 >   Subjective: Still feels short of breath not back to baseline.  She does have cough and wheezing.  Objective: Vitals:   07/22/20 0900 07/22/20 1000 07/22/20 1121 07/22/20 1614  BP: (!) 152/61 (!) 151/61    Pulse: (!) 52 61 (!) 56 (!) 57  Resp: 17 (!) 21 18 19   Temp:   (!) 97.4 F (36.3 C) 98.4 F (36.9 C)  TempSrc:   Oral Oral  SpO2: 95% 99%  94%  Weight:      Height:       No intake or output data in the 24 hours ending 07/22/20 1739 Filed Weights   07/21/20 1118 07/21/20 1848 07/22/20 0500  Weight: 99.3 kg 97.9 kg 97.6 kg    Examination:  General exam: Appears calm and comfortable  Respiratory system: bilateral wheezing. Respiratory effort normal. Cardiovascular system: S1 & S2 heard, RRR. No JVD, murmurs, rubs, gallops or clicks. 1+ pedal edema Gastrointestinal system: Abdomen  is nondistended, soft and nontender. No organomegaly or masses felt. Normal bowel sounds heard. Central nervous system: Alert and oriented. No focal neurological deficits. Extremities: Symmetric 5 x 5 power. Skin: No rashes, lesions or ulcers Psychiatry: Judgement and insight appear normal. Mood & affect appropriate.     Data Reviewed: I have personally reviewed following labs and imaging studies  CBC: Recent Labs  Lab 07/21/20 1152  WBC 6.8  NEUTROABS 5.4  HGB 11.8*  HCT  38.5  MCV 102.7*  PLT 443   Basic Metabolic Panel: Recent Labs  Lab 07/21/20 1152 07/22/20 0446  NA 137 138  K 4.7 4.8  CL 92* 92*  CO2 34* 37*  GLUCOSE 229* 300*  BUN 17 21*  CREATININE 1.12* 1.22*  CALCIUM 8.6* 8.7*   GFR: Estimated Creatinine Clearance: 52.9 mL/min (A) (by C-G formula based on SCr of 1.22 mg/dL (H)). Liver Function Tests: No results for input(s): AST, ALT, ALKPHOS, BILITOT, PROT, ALBUMIN in the last 168 hours. No results for input(s): LIPASE, AMYLASE in the last 168 hours. No results for input(s): AMMONIA in the last 168 hours. Coagulation Profile: Recent Labs  Lab 07/21/20 1152 07/21/20 2121 07/22/20 0446  INR 4.2* 3.8* 3.6*   Cardiac Enzymes: No results for input(s): CKTOTAL, CKMB, CKMBINDEX, TROPONINI in the last 168 hours. BNP (last 3 results) No results for input(s): PROBNP in the last 8760 hours. HbA1C: No results for input(s): HGBA1C in the last 72 hours. CBG: Recent Labs  Lab 07/21/20 2118 07/22/20 0711 07/22/20 1119 07/22/20 1613  GLUCAP 267* 252* 249* 193*   Lipid Profile: No results for input(s): CHOL, HDL, LDLCALC, TRIG, CHOLHDL, LDLDIRECT in the last 72 hours. Thyroid Function Tests: No results for input(s): TSH, T4TOTAL, FREET4, T3FREE, THYROIDAB in the last 72 hours. Anemia Panel: No results for input(s): VITAMINB12, FOLATE, FERRITIN, TIBC, IRON, RETICCTPCT in the last 72 hours. Sepsis Labs: Recent Labs  Lab 07/21/20 1153 07/21/20 1347  LATICACIDVEN 2.9* 1.7    Recent Results (from the past 240 hour(s))  Respiratory Panel by RT PCR (Flu A&B, Covid) - Nasopharyngeal Swab     Status: None   Collection Time: 07/21/20 11:19 AM   Specimen: Nasopharyngeal Swab  Result Value Ref Range Status   SARS Coronavirus 2 by RT PCR NEGATIVE NEGATIVE Final    Comment: (NOTE) SARS-CoV-2 target nucleic acids are NOT DETECTED.  The SARS-CoV-2 RNA is generally detectable in upper respiratoy specimens during the acute phase of  infection. The lowest concentration of SARS-CoV-2 viral copies this assay can detect is 131 copies/mL. A negative result does not preclude SARS-Cov-2 infection and should not be used as the sole basis for treatment or other patient management decisions. A negative result may occur with  improper specimen collection/handling, submission of specimen other than nasopharyngeal swab, presence of viral mutation(s) within the areas targeted by this assay, and inadequate number of viral copies (<131 copies/mL). A negative result must be combined with clinical observations, patient history, and epidemiological information. The expected result is Negative.  Fact Sheet for Patients:  PinkCheek.be  Fact Sheet for Healthcare Providers:  GravelBags.it  This test is no t yet approved or cleared by the [place] FDA and  has been authorized for detection and/or diagnosis of SARS-CoV-2 by FDA under an Emergency Use Authorization (EUA). This EUA will remain  in effect (meaning this test can be used) for the duration of the COVID-19 declaration under Section 564(b)(1) of the Act, 21 U.S.C. section 360bbb-3(b)(1), unless the authorization is terminated  or revoked sooner.     Influenza A by PCR NEGATIVE NEGATIVE Final   Influenza B by PCR NEGATIVE NEGATIVE Final    Comment: (NOTE) The Xpert Xpress SARS-CoV-2/FLU/RSV assay is intended as an aid in  the diagnosis of influenza from Nasopharyngeal swab specimens and  should not be used as a sole basis for treatment. Nasal washings and  aspirates are unacceptable for Xpert Xpress SARS-CoV-2/FLU/RSV  testing.  Fact Sheet for Patients: PinkCheek.be  Fact Sheet for Healthcare Providers: GravelBags.it  This test is not yet approved or cleared by the [place] FDA and  has been authorized for detection and/or diagnosis of SARS-CoV-2  by  FDA under an Emergency Use Authorization (EUA). This EUA will remain  in effect (meaning this test can be used) for the duration of the  Covid-19 declaration under Section 564(b)(1) of the Act, 21  U.S.C. section 360bbb-3(b)(1), unless the authorization is  terminated or revoked. Performed at [facility], [address] [place], [place] [address]   Culture, blood (routine x 2)     Status: None (Preliminary result)   Collection Time: 07/21/20 11:52 AM   Specimen: BLOOD  Result Value Ref Range Status   Specimen Description BLOOD SITE NOT SPECIFIED DRAWN BY RN  Final   Special Requests   Final    BOTTLES DRAWN AEROBIC AND ANAEROBIC Blood Culture adequate volume   Culture   Final    NO GROWTH < 24 HOURS Performed at [facility], [address] [place], [place] [address]    Report Status PENDING  Incomplete  Culture, blood (routine x 2)     Status: None (Preliminary result)   Collection Time: 07/21/20 12:41 PM   Specimen: BLOOD  Result Value Ref Range Status   Specimen Description BLOOD SITE NOT SPECIFIED  Final   Special Requests   Final    BOTTLES DRAWN AEROBIC AND ANAEROBIC Blood Culture results may not be optimal due to an inadequate volume of blood received in culture bottles   Culture   Final    NO GROWTH < 24 HOURS Performed at [facility], [address], [place], [place] [address]    Report Status PENDING  Incomplete  Culture, respiratory (non-expectorated)     Status: None (Preliminary result)   Collection Time: 07/21/20  3:11 PM   Specimen: Tracheal Aspirate; Respiratory  Result Value Ref Range Status   Specimen Description   Final    TRACHEAL ASPIRATE Performed at [facility], [address], [place], [place] [address]    Special Requests   Final    NONE Performed at [facility], [address], [place], [place] [address]    Gram Stain   Final    RARE WBC PRESENT,BOTH PMN AND MONONUCLEAR RARE GRAM POSITIVE RODS RARE GRAM NEGATIVE RODS    Culture   Final      TOO YOUNG TO READ Performed at [facility], [place] [address] [place],  [address]    Report Status PENDING  Incomplete         Radiology Studies: DG Chest Port 1 View  Result Date: 07/21/2020 CLINICAL DATA:  Shortness of breath EXAM: PORTABLE CHEST 1 VIEW COMPARISON:  07/03/2020 FINDINGS: Tracheostomy tube remains in place. Post CABG changes. Stable cardiomegaly. There is pulmonary vascular congestion with diffuse bilateral interstitial prominence. Streaky perihilar and bibasilar opacities. No large pleural fluid collection. No pneumothorax. IMPRESSION: Findings suggestive of CHF with pulmonary edema. Streaky perihilar and bibasilar opacities may represent atelectasis, alveolar edema,  versus infection. Electronically Signed   By: Davina Poke D.O.   On: 07/21/2020 11:46        Scheduled Meds: . amiodarone  200 mg Oral BID   Followed by  . [START ON 07/24/2020] amiodarone  200 mg Oral Daily  . budesonide (PULMICORT) nebulizer solution  1 mg Nebulization BID  . Chlorhexidine Gluconate Cloth  6 each Topical Daily  . doxycycline  100 mg Oral Q12H  . furosemide  60 mg Intravenous BID  . gabapentin  900 mg Oral QHS  . insulin aspart  0-20 Units Subcutaneous TID WC  . insulin aspart  0-5 Units Subcutaneous QHS  . insulin glargine  30 Units Subcutaneous QHS  . ipratropium-albuterol  3 mL Nebulization Q6H  . methylPREDNISolone (SOLU-MEDROL) injection  60 mg Intravenous Q12H   Followed by  . predniSONE  40 mg Oral Q breakfast  . pantoprazole  40 mg Oral Daily  . potassium chloride SA  40 mEq Oral Daily  . sertraline  50 mg Oral QHS  . tamsulosin  0.4 mg Oral Daily  . Warfarin - Pharmacist Dosing Inpatient   Does not apply q1600   Continuous Infusions:   LOS: 1 day    Time spent: 86mins    Kathie Dike, MD [facility]   If 7PM-7AM, please contact night-coverage [email or web address]  07/22/2020, 5:39 PM

## 2020-07-23 DIAGNOSIS — I5043 Acute on chronic combined systolic (congestive) and diastolic (congestive) heart failure: Secondary | ICD-10-CM | POA: Diagnosis not present

## 2020-07-23 LAB — BASIC METABOLIC PANEL
Anion gap: 10 (ref 5–15)
BUN: 30 mg/dL — ABNORMAL HIGH (ref 6–20)
CO2: 37 mmol/L — ABNORMAL HIGH (ref 22–32)
Calcium: 8.7 mg/dL — ABNORMAL LOW (ref 8.9–10.3)
Chloride: 90 mmol/L — ABNORMAL LOW (ref 98–111)
Creatinine, Ser: 1.38 mg/dL — ABNORMAL HIGH (ref 0.44–1.00)
GFR, Estimated: 45 mL/min — ABNORMAL LOW (ref >60)
Glucose, Bld: 257 mg/dL — ABNORMAL HIGH (ref 70–99)
Potassium: 4.8 mmol/L (ref 3.5–5.1)
Sodium: 137 mmol/L (ref 135–145)

## 2020-07-23 LAB — CBC
HCT: 35.1 % — ABNORMAL LOW (ref 36.0–46.0)
Hemoglobin: 10.5 g/dL — ABNORMAL LOW (ref 12.0–15.0)
MCH: 31.2 pg (ref 26.0–34.0)
MCHC: 29.9 g/dL — ABNORMAL LOW (ref 30.0–36.0)
MCV: 104.2 fL — ABNORMAL HIGH (ref 80.0–100.0)
Platelets: 246 10*3/uL (ref 150–400)
RBC: 3.37 MIL/uL — ABNORMAL LOW (ref 3.87–5.11)
RDW: 17.9 % — ABNORMAL HIGH (ref 11.5–15.5)
WBC: 6.8 10*3/uL (ref 4.0–10.5)
nRBC: 0 % (ref 0.0–0.2)

## 2020-07-23 LAB — PROTIME-INR
INR: 3.6 — ABNORMAL HIGH (ref 0.8–1.2)
Prothrombin Time: 34.5 seconds — ABNORMAL HIGH (ref 11.4–15.2)

## 2020-07-23 LAB — GLUCOSE, CAPILLARY
Glucose-Capillary: 202 mg/dL — ABNORMAL HIGH (ref 70–99)
Glucose-Capillary: 291 mg/dL — ABNORMAL HIGH (ref 70–99)
Glucose-Capillary: 127 mg/dL — ABNORMAL HIGH (ref 70–99)
Glucose-Capillary: 129 mg/dL — ABNORMAL HIGH (ref 70–99)

## 2020-07-23 LAB — DIGOXIN LEVEL: Digoxin Level: 2.4 ng/mL (ref 1.0–2.0)

## 2020-07-23 MED ORDER — INSULIN GLARGINE 100 UNIT/ML ~~LOC~~ SOLN
35.0000 [IU] | Freq: Every day | SUBCUTANEOUS | Status: DC
  Administered 2020-07-23: 35 [IU] via SUBCUTANEOUS
  Filled 2020-07-23 (×2): qty 0.35

## 2020-07-23 MED ORDER — DEXTROSE 5 % IV SOLN
3.0000 mg | Freq: Two times a day (BID) | INTRAVENOUS | Status: DC
  Administered 2020-07-23: 3 mg via INTRAVENOUS
  Filled 2020-07-23 (×3): qty 12

## 2020-07-23 MED ORDER — INSULIN ASPART 100 UNIT/ML ~~LOC~~ SOLN
5.0000 [IU] | Freq: Three times a day (TID) | SUBCUTANEOUS | Status: DC
  Administered 2020-07-23 – 2020-07-26 (×10): 5 [IU] via SUBCUTANEOUS

## 2020-07-23 MED ORDER — BUMETANIDE 1 MG PO TABS
2.0000 mg | ORAL_TABLET | Freq: Two times a day (BID) | ORAL | Status: DC
  Administered 2020-07-23: 2 mg via ORAL
  Filled 2020-07-23: qty 2

## 2020-07-23 MED ORDER — BUMETANIDE 0.25 MG/ML IJ SOLN: 2.0000 mg | Freq: Two times a day (BID) | INTRAMUSCULAR | Status: DC

## 2020-07-23 NOTE — Plan of Care (Signed)
°  Problem: Acute Rehab PT Goals(only PT should resolve) Goal: Pt Will Go Supine/Side To Sit Outcome: Progressing Flowsheets (Taken 07/23/2020 1558) Pt will go Supine/Side to Sit:  Independently  with modified independence Goal: Patient Will Transfer Sit To/From Stand Outcome: Progressing Flowsheets (Taken 07/23/2020 1558) Patient will transfer sit to/from stand: with modified independence Goal: Pt Will Transfer Bed To Chair/Chair To Bed Outcome: Progressing Flowsheets (Taken 07/23/2020 1558) Pt will Transfer Bed to Chair/Chair to Bed: with modified independence Goal: Pt Will Ambulate Outcome: Progressing Flowsheets (Taken 07/23/2020 1558) Pt will Ambulate:  100 feet  with supervision  with cane   3:59 PM, 07/23/20 Lonell Grandchild, MPT Physical Therapist with [facility] [phone] office [phone] mobile phone

## 2020-07-23 NOTE — Progress Notes (Signed)
PROGRESS NOTE    Theresa [name]  SEG:[id number] DOB: [birth year] DOA: 07/21/2020 PCP: Monico Blitz, MD   Brief Narrative:  54 year old female with a history of combined CHF, COPD, chronic respiratory failure, tracheostomy dependent, lupus anticoagulant with history of VTE on chronic anticoagulation, brought to the hospital with shortness of breath.  Admitted for increased weight gain secondary to CHF exacerbation as well as an element of COPD exacerbation.  Assessment & Plan:   Active Problems:   Essential hypertension   COPD exacerbation (HCC)   Lupus anticoagulant syndrome ([place])   Tracheostomy in place [facility]), chronic since 2002   Uncontrolled type 2 diabetes mellitus with hyperglycemia, with long-term current use of insulin (HCC)   Acute on chronic combined systolic and diastolic HF (heart failure) (HCC)   Acute on chronic respiratory failure (HCC)   Acute on chronic respiratory failure with hypoxia-persistent -Chronically on 4 L of oxygen via nasal cannula -Currently requiring 10 L via trach collar -Continue to wean down oxygen as tolerated  Acute on chronic combined CHF-ongoing -Most recent echocardiogram from 07/02/2020 shows ejection fraction 50% -Sister states that patient is "careless" and does not adequately take medications or weigh herself, or watch diet -Was recently started on twice daily Bumex -IV Lasix changed to IV Bumex 3 mg twice daily based on home dosages as she continues to have ongoing volume overload -Mild downtrend in weight -She still has evidence of volume overload -Continue to monitor  COPD exacerbation -She continues to have wheezing and cough -Continue on bronchodilators -Antibiotics with doxycycline -Continue IV steroids -Continue pulmonary hygiene  Type 2 diabetes -With steroid induced hyperglycemia -Continue on basal insulin, increased dose to 35 units daily -Added NovoLog 3 times daily 5 units -Continue sliding scale  insulin  Lupus anticoagulant syndrome with history of VTE -Continue anticoagulation with warfarin  Recent diagnosis of atrial flutter -Continue on amiodarone -She is anticoagulated -She is also on digoxin with supratherapeutic levels. Will hold further digoxin -Digoxin level still supratherapeutic at 2.4, recheck in a.m.   DVT prophylaxis: Coumadin Code Status: Full code Family Communication: Discussed with sister on phone 11/17 Disposition Plan: Status is: Inpatient  Remains inpatient appropriate because:Inpatient level of care appropriate due to severity of illness   Dispo: The patient is from: Home  Anticipated d/c is to: Home vs SNF  Anticipated d/c date is: 2-3 days  Patient currently is not medically stable to d/c.  She continues to have significant hypoxemia and volume overload requiring aggressive diuresis.  Antimicrobials:  Anti-infectives (From admission, onward)   Start     Dose/Rate Route Frequency Ordered Stop   07/21/20 2000  doxycycline (VIBRA-TABS) tablet 100 mg        100 mg Oral Every 12 hours 07/21/20 1825 07/26/20 1959   07/21/20 1515  levofloxacin (LEVAQUIN) IVPB 750 mg        750 mg 100 mL/hr over 90 Minutes Intravenous  Once 07/21/20 1512 07/21/20 1654       Subjective: Patient seen and evaluated today with ongoing shortness of breath with no improvement since yesterday.  She states she has not had very much urine output.  Objective: Vitals:   07/23/20 1000 07/23/20 1100 07/23/20 1200 07/23/20 1300  BP: 139/75 140/67 136/61 (!) 108/53  Pulse: (!) 58 (!) 59 (!) 54 65  Resp: 18 19 17 14   Temp:      TempSrc:      SpO2: 96% (!) 88% (!) 88% 91%  Weight:      Height:  Intake/Output Summary (Last 24 hours) at 07/23/2020 1333 Last data filed at 07/23/2020 1300 Gross per 24 hour  Intake 298.12 ml  Output 900 ml  Net -601.88 ml   Filed Weights   07/21/20 1848 07/22/20 0500 07/23/20 0500   Weight: 97.9 kg 97.6 kg 97.6 kg    Examination:  General exam: Appears calm and comfortable, obese Respiratory system: Clear to auscultation. Respiratory effort normal.  With trach on trach collar 10 L. Cardiovascular system: S1 & S2 heard, RRR. Gastrointestinal system: Abdomen is nondistended, soft and nontender. No organomegaly or masses felt. Normal bowel sounds heard. Central nervous system: Alert and awake Extremities: Edematous with 1-2+ pitting edema bilaterally Skin: No rashes, lesions or ulcers Psychiatry: Flat affect    Data Reviewed: I have personally reviewed following labs and imaging studies  CBC: Recent Labs  Lab 07/21/20 1152 07/23/20 0411  WBC 6.8 6.8  NEUTROABS 5.4  --   HGB 11.8* 10.5*  HCT 38.5 35.1*  MCV 102.7* 104.2*  PLT 244 161   Basic Metabolic Panel: Recent Labs  Lab 07/21/20 1152 07/22/20 0446 07/23/20 0411  NA 137 138 137  K 4.7 4.8 4.8  CL 92* 92* 90*  CO2 34* 37* 37*  GLUCOSE 229* 300* 257*  BUN 17 21* 30*  CREATININE 1.12* 1.22* 1.38*  CALCIUM 8.6* 8.7* 8.7*   GFR: Estimated Creatinine Clearance: 46.8 mL/min (A) (by C-G formula based on SCr of 1.38 mg/dL (H)). Liver Function Tests: No results for input(s): AST, ALT, ALKPHOS, BILITOT, PROT, ALBUMIN in the last 168 hours. No results for input(s): LIPASE, AMYLASE in the last 168 hours. No results for input(s): AMMONIA in the last 168 hours. Coagulation Profile: Recent Labs  Lab 07/21/20 1152 07/21/20 2121 07/22/20 0446 07/23/20 0411  INR 4.2* 3.8* 3.6* 3.6*   Cardiac Enzymes: No results for input(s): CKTOTAL, CKMB, CKMBINDEX, TROPONINI in the last 168 hours. BNP (last 3 results) No results for input(s): PROBNP in the last 8760 hours. HbA1C: No results for input(s): HGBA1C in the last 72 hours. CBG: Recent Labs  Lab 07/22/20 1119 07/22/20 1613 07/22/20 2214 07/23/20 0734 07/23/20 1114  GLUCAP 249* 193* 213* 202* 291*   Lipid Profile: No results for input(s):  CHOL, HDL, LDLCALC, TRIG, CHOLHDL, LDLDIRECT in the last 72 hours. Thyroid Function Tests: No results for input(s): TSH, T4TOTAL, FREET4, T3FREE, THYROIDAB in the last 72 hours. Anemia Panel: No results for input(s): VITAMINB12, FOLATE, FERRITIN, TIBC, IRON, RETICCTPCT in the last 72 hours. Sepsis Labs: Recent Labs  Lab 07/21/20 1153 07/21/20 1347  LATICACIDVEN 2.9* 1.7    Recent Results (from the past 240 hour(s))  Respiratory Panel by RT PCR (Flu A&B, Covid) - Nasopharyngeal Swab     Status: None   Collection Time: 07/21/20 11:19 AM   Specimen: Nasopharyngeal Swab  Result Value Ref Range Status   SARS Coronavirus 2 by RT PCR NEGATIVE NEGATIVE Final    Comment: (NOTE) SARS-CoV-2 target nucleic acids are NOT DETECTED.  The SARS-CoV-2 RNA is generally detectable in upper respiratoy specimens during the acute phase of infection. The lowest concentration of SARS-CoV-2 viral copies this assay can detect is 131 copies/mL. A negative result does not preclude SARS-Cov-2 infection and should not be used as the sole basis for treatment or other patient management decisions. A negative result may occur with  improper specimen collection/handling, submission of specimen other than nasopharyngeal swab, presence of viral mutation(s) within the areas targeted by this assay, and inadequate number of viral copies (<131 copies/mL).  A negative result must be combined with clinical observations, patient history, and epidemiological information. The expected result is Negative.  Fact Sheet for Patients:  PinkCheek.be  Fact Sheet for Healthcare Providers:  GravelBags.it  This test is no t yet approved or cleared by the [place] FDA and  has been authorized for detection and/or diagnosis of SARS-CoV-2 by FDA under an Emergency Use Authorization (EUA). This EUA will remain  in effect (meaning this test can be used) for the duration of  the COVID-19 declaration under Section 564(b)(1) of the Act, 21 U.S.C. section 360bbb-3(b)(1), unless the authorization is terminated or revoked sooner.     Influenza A by PCR NEGATIVE NEGATIVE Final   Influenza B by PCR NEGATIVE NEGATIVE Final    Comment: (NOTE) The Xpert Xpress SARS-CoV-2/FLU/RSV assay is intended as an aid in  the diagnosis of influenza from Nasopharyngeal swab specimens and  should not be used as a sole basis for treatment. Nasal washings and  aspirates are unacceptable for Xpert Xpress SARS-CoV-2/FLU/RSV  testing.  Fact Sheet for Patients: PinkCheek.be  Fact Sheet for Healthcare Providers: GravelBags.it  This test is not yet approved or cleared by the [place] FDA and  has been authorized for detection and/or diagnosis of SARS-CoV-2 by  FDA under an Emergency Use Authorization (EUA). This EUA will remain  in effect (meaning this test can be used) for the duration of the  Covid-19 declaration under Section 564(b)(1) of the Act, 21  U.S.C. section 360bbb-3(b)(1), unless the authorization is  terminated or revoked. Performed at [facility], [address], [place], [place] [address]   Culture, blood (routine x 2)     Status: None (Preliminary result)   Collection Time: 07/21/20 11:52 AM   Specimen: BLOOD  Result Value Ref Range Status   Specimen Description BLOOD SITE NOT SPECIFIED DRAWN BY RN  Final   Special Requests   Final    BOTTLES DRAWN AEROBIC AND ANAEROBIC Blood Culture adequate volume   Culture   Final    NO GROWTH 2 DAYS Performed at [facility], [address] [place], [place] [address]    Report Status PENDING  Incomplete  Culture, blood (routine x 2)     Status: None (Preliminary result)   Collection Time: 07/21/20 12:41 PM   Specimen: BLOOD  Result Value Ref Range Status   Specimen Description BLOOD SITE NOT SPECIFIED  Final   Special Requests   Final    BOTTLES DRAWN  AEROBIC AND ANAEROBIC Blood Culture results may not be optimal due to an inadequate volume of blood received in culture bottles   Culture   Final    NO GROWTH 2 DAYS Performed at [facility], [address] [place], [place] [address]    Report Status PENDING  Incomplete  Culture, respiratory (non-expectorated)     Status: None (Preliminary result)   Collection Time: 07/21/20  3:11 PM   Specimen: Tracheal Aspirate; Respiratory  Result Value Ref Range Status   Specimen Description   Final    TRACHEAL ASPIRATE Performed at [facility], [address], [place], [place] [address]    Special Requests   Final    NONE Performed at [facility], [address] [place],  [address]    Gram Stain   Final    RARE WBC PRESENT,BOTH PMN AND MONONUCLEAR RARE GRAM POSITIVE RODS RARE GRAM NEGATIVE RODS    Culture   Final    RARE PSEUDOMONAS AERUGINOSA SUSCEPTIBILITIES TO FOLLOW Performed at [facility], [place] [address], [place], [place] [address]    Report Status PENDING  Incomplete         Radiology Studies: No results found.      Scheduled Meds: . amiodarone  200 mg Oral BID   Followed by  . [START ON 07/24/2020] amiodarone  200 mg Oral Daily  . budesonide (PULMICORT) nebulizer solution  1 mg Nebulization BID  . Chlorhexidine Gluconate Cloth  6 each Topical Daily  . doxycycline  100 mg Oral Q12H  . gabapentin  900 mg Oral QHS  . insulin aspart  0-20 Units Subcutaneous TID WC  . insulin aspart  0-5 Units Subcutaneous QHS  . insulin aspart  5 Units Subcutaneous TID WC  . insulin glargine  35 Units Subcutaneous QHS  . ipratropium-albuterol  3 mL Nebulization Q6H  . methylPREDNISolone (SOLU-MEDROL) injection  60 mg Intravenous Q12H  . pantoprazole  40 mg Oral Daily  . potassium chloride SA  40 mEq Oral Daily  . sertraline  50 mg Oral QHS  . tamsulosin  0.4 mg Oral Daily  . Warfarin - Pharmacist Dosing Inpatient   Does not apply q1600   Continuous Infusions: .  bumetanide (BUMEX) IV Stopped (07/23/20 1130)     LOS: 2 days    Time spent: 35 minutes    Sharren Schnurr Darleen Crocker, DO [facility]  If 7PM-7AM, please contact night-coverage [email or web address] 07/23/2020, 1:33 PM

## 2020-07-23 NOTE — Progress Notes (Signed)
ANTICOAGULATION CONSULT NOTE   Pharmacy Consult for Warfarin Indication: History of VTE   Patient Measurements: Height: 4\' 10"  (147.3 cm) Weight: 97.6 kg (215 lb 2.7 oz) IBW/kg (Calculated) : 40.9  Vital Signs: Temp: 98 F (36.7 C) (11/17 0400) Temp Source: Oral (11/17 0000) BP: 124/60 (11/17 0400) Pulse Rate: 57 (11/17 0800)  Labs: Recent Labs    07/21/20 1152 07/21/20 1152 07/21/20 1347 07/21/20 2121 07/22/20 0446 07/23/20 0411  HGB 11.8*  --   --   --   --  10.5*  HCT 38.5  --   --   --   --  35.1*  PLT 244  --   --   --   --  246  LABPROT 39.6*   < >  --  36.0* 34.7* 34.5*  INR 4.2*   < >  --  3.8* 3.6* 3.6*  CREATININE 1.12*  --   --   --  1.22* 1.38*  TROPONINIHS 17  --  17  --   --   --    < > = values in this interval not displayed.    Estimated Creatinine Clearance: 46.8 mL/min (A) (by C-G formula based on SCr of 1.38 mg/dL (H)).  Assessment: 54 yr old female presented to [facility] ED with SOB. Medical hx includes lupus anticoagulant with hx of VTE, for which she is on anticoagulation with warfarin PTA (5 mg MWF, 2.5 mg other days of the week; per med rec, last dose was on the evening of 07/20/20). Pt also has recent dx of atrial flutter.   Major Drug Interaction: Target a 25- 50% reduction in weekly warfarin dose after 2 weeks of dual therapy with amiodarone and warfarin    Goal of Therapy:  INR 2-3 Monitor platelets by anticoagulation protocol: Yes   Plan:  Hold warfarin again tonight  for INR of 3.6 Consider weekly dose reduction of warfarin d/t major interaction w/ amiodarone Daily INR and CBC Monitor for signs/symptoms of bleeding  Margot Ables, PharmD Clinical Pharmacist 07/23/2020 8:16 AM

## 2020-07-23 NOTE — Progress Notes (Signed)
Pt's sister called expressing concerns about patient still smoking. Per pt, she does not need help to quit smoking and refused a nicotine patch when offered to her. Sister requesting MD follow up on this - explained to sister that patient is alert & oriented and we are unable to make her utilize the patch to quit smoking if she is unwilling. Pt husband at bedside for conversation.

## 2020-07-23 NOTE — Evaluation (Signed)
Physical Therapy Evaluation Patient Details Name: Theresa [name] MRN: [id number] DOB: 08-17-1966 Today's Date: 07/23/2020   History of Present Illness  Theresa [name] is a 54 y.o. female with medical history significant of combined CHF, COPD, chronic respiratory failure, tracheostomy dependent, lupus anticoagulant with history of VTE on chronic anticoagulation therapy, was recently discharged from hospital on 11/1 after being treated for decompensated CHF.  Patient reports compliance with medications.  She had come back to cardiology office today as hospital follow-up and was noted to have increased weight.  She reports feeling generally weak, feels that her legs are heavier.  She also describes abdominal distention.  She has felt more short of breath.  She has had worsening cough productive of yellowish sputum.  She has noticed that she is wheezing more.  No chest pain.  No fever.  No vomiting.  No abdominal pain.  No diarrhea.  Review of records indicate that her weight is up approximately 6 pounds since her last discharge.  While in the cardiology office, she was noted to be hypoxic at 83% on chronic 4 L of oxygen.  She was sent to the emergency room for evaluation    Clinical Impression  Patient demonstrates good return for sitting up at bedside, transferring to chair/BSC, limited mostly due to SOB with SpO2 dropping from 93% to 88% during exertion and limited to a few steps at bedside.  Patient tolerated sitting up in chair after therapy - RN notified.  Patient will benefit from continued physical therapy in hospital and recommended venue below to increase strength, balance, endurance for safe ADLs and gait.     Follow Up Recommendations Home health PT    Equipment Recommendations  None recommended by PT    Recommendations for Other Services       Precautions / Restrictions Precautions Precautions: None Restrictions Weight Bearing Restrictions: No      Mobility  Bed  Mobility Overal bed mobility: Needs Assistance Bed Mobility: Supine to Sit;Sit to Supine     Supine to sit: Modified independent (Device/Increase time);Supervision Sit to supine: Modified independent (Device/Increase time);Supervision   General bed mobility comments: slightly labored movement    Transfers Overall transfer level: Needs assistance Equipment used: 1 person hand held assist Transfers: Sit to/from Stand;Stand Pivot Transfers Sit to Stand: Supervision Stand pivot transfers: Supervision       General transfer comment: slightly labored movement  Ambulation/Gait Ambulation/Gait assistance: Supervision;Min guard Gait Distance (Feet): 6 Feet Assistive device: 1 person hand held assist Gait Pattern/deviations: Decreased step length - right;Decreased step length - left;Decreased stride length Gait velocity: decreased   General Gait Details: slightly labored movement without loss of balance, limited mostly due SOB with SpO2 dropping from 93% to 88% while on 10 LPM  Stairs            Wheelchair Mobility    Modified Rankin (Stroke Patients Only)       Balance Overall balance assessment: Needs assistance Sitting-balance support: Feet supported;No upper extremity supported Sitting balance-Leahy Scale: Good Sitting balance - Comments: seated at EOB   Standing balance support: During functional activity;No upper extremity supported Standing balance-Leahy Scale: Fair Standing balance comment: fair/good without AD                             Pertinent Vitals/Pain Pain Assessment: No/denies pain    Home Living Family/patient expects to be discharged to:: Private residence Living Arrangements: Spouse/significant other Available Help  at Discharge: Family;Available 24 hours/day Type of Home: House Home Access: Stairs to enter Entrance Stairs-Rails: None Entrance Stairs-Number of Steps: 2-3 Home Layout: One level Home Equipment: Walker - 2  wheels;Cane - single point;Bedside commode;Wheelchair - manual;Tub bench;Shower seat      Prior Function Level of Independence: Needs assistance   Gait / Transfers Assistance Needed: Household ambulator leaning on walls and nearby objects  ADL's / Homemaking Assistance Needed: assisted by family        Hand Dominance        Extremity/Trunk Assessment   Upper Extremity Assessment Upper Extremity Assessment: Overall WFL for tasks assessed    Lower Extremity Assessment Lower Extremity Assessment: Generalized weakness    Cervical / Trunk Assessment Cervical / Trunk Assessment: Normal  Communication   Communication: No difficulties  Cognition Arousal/Alertness: Awake/alert Behavior During Therapy: WFL for tasks assessed/performed Overall Cognitive Status: Within Functional Limits for tasks assessed                                        General Comments      Exercises     Assessment/Plan    PT Assessment Patient needs continued PT services  PT Problem List Decreased strength;Decreased activity tolerance;Decreased balance;Decreased mobility       PT Treatment Interventions Gait training;Stair training;Functional mobility training;DME instruction;Therapeutic activities;Therapeutic exercise;Patient/family education;Balance training    PT Goals (Current goals can be found in the Care Plan section)  Acute Rehab PT Goals Patient Stated Goal: return home with family assist PT Goal Formulation: With patient Time For Goal Achievement: 07/30/20 Potential to Achieve Goals: Good    Frequency Min 3X/week   Barriers to discharge        Co-evaluation               AM-PAC PT "6 Clicks" Mobility  Outcome Measure Help needed turning from your back to your side while in a flat bed without using bedrails?: None Help needed moving from lying on your back to sitting on the side of a flat bed without using bedrails?: None Help needed moving to and from  a bed to a chair (including a wheelchair)?: A Little Help needed standing up from a chair using your arms (e.g., wheelchair or bedside chair)?: A Little Help needed to walk in hospital room?: A Little Help needed climbing 3-5 steps with a railing? : A Little 6 Click Score: 20    End of Session Equipment Utilized During Treatment: Oxygen Activity Tolerance: Patient tolerated treatment well;Patient limited by fatigue Patient left: in chair;with call bell/phone within reach Nurse Communication: Mobility status PT Visit Diagnosis: Unsteadiness on feet (R26.81);Other abnormalities of gait and mobility (R26.89);Muscle weakness (generalized) (M62.81)    Time: 8366-2947 PT Time Calculation (min) (ACUTE ONLY): 26 min   Charges:   PT Evaluation $PT Eval Moderate Complexity: 1 Mod PT Treatments $Therapeutic Activity: 23-37 mins        3:56 PM, 07/23/20 Lonell Grandchild, MPT Physical Therapist with [facility] [phone] office [phone] mobile phone

## 2020-07-23 NOTE — Progress Notes (Signed)
Inpatient Diabetes Program Recommendations  AACE/ADA: New Consensus Statement on Inpatient Glycemic Control   Target Ranges:  Prepandial:   less than 140 mg/dL      Peak postprandial:   less than 180 mg/dL (1-2 hours)      Critically ill patients:  140 - 180 mg/dL   Results for VIVYAN, BIGGERS (MRN [id number]) as of 07/23/2020 12:00  Ref. Range 07/22/2020 07:11 07/22/2020 11:19 07/22/2020 16:13 07/22/2020 22:14 07/23/2020 07:34 07/23/2020 11:14  Glucose-Capillary Latest Ref Range: 70 - 99 mg/dL 252 (H) 249 (H) 193 (H) 213 (H) 202 (H) 291 (H)   Review of Glycemic Control  Diabetes history: DM2 Outpatient Diabetes medications: Tresiba 30 units QHS, Novolog 0-10 units TID, Ozempic 0.5 mg Qweek (Wednesday).  Current orders for Inpatient glycemic control: Novolog 0-20 units TID, Novolog 0-5 HS, Lantus 30 units QHS; Solumedrol 60 Q12  Inpatient Diabetes Program Recommendations:    Insulin: If steroids are continued as ordered, please consider increasing Lantus to 35 units QHS and ordering Novolog 5 units TID with meals for meal coverage if patient eats at least 50% of meals.  Thanks, Barnie Alderman, RN, MSN, CDE Diabetes Coordinator Inpatient Diabetes Program [phone] (Team Pager from 8am to 5pm)

## 2020-07-24 DIAGNOSIS — I5043 Acute on chronic combined systolic (congestive) and diastolic (congestive) heart failure: Secondary | ICD-10-CM | POA: Diagnosis not present

## 2020-07-24 LAB — CBC
HCT: 35.3 % — ABNORMAL LOW (ref 36.0–46.0)
Hemoglobin: 10.6 g/dL — ABNORMAL LOW (ref 12.0–15.0)
MCH: 31.3 pg (ref 26.0–34.0)
MCHC: 30 g/dL (ref 30.0–36.0)
MCV: 104.1 fL — ABNORMAL HIGH (ref 80.0–100.0)
Platelets: 256 10*3/uL (ref 150–400)
RBC: 3.39 MIL/uL — ABNORMAL LOW (ref 3.87–5.11)
RDW: 17.8 % — ABNORMAL HIGH (ref 11.5–15.5)
WBC: 7.3 10*3/uL (ref 4.0–10.5)
nRBC: 0 % (ref 0.0–0.2)

## 2020-07-24 LAB — PROTIME-INR
INR: 2.5 — ABNORMAL HIGH (ref 0.8–1.2)
Prothrombin Time: 26.5 seconds — ABNORMAL HIGH (ref 11.4–15.2)

## 2020-07-24 LAB — BASIC METABOLIC PANEL
Anion gap: 10 (ref 5–15)
BUN: 37 mg/dL — ABNORMAL HIGH (ref 6–20)
CO2: 38 mmol/L — ABNORMAL HIGH (ref 22–32)
Calcium: 8.5 mg/dL — ABNORMAL LOW (ref 8.9–10.3)
Chloride: 89 mmol/L — ABNORMAL LOW (ref 98–111)
Creatinine, Ser: 1.27 mg/dL — ABNORMAL HIGH (ref 0.44–1.00)
GFR, Estimated: 50 mL/min — ABNORMAL LOW (ref >60)
Glucose, Bld: 277 mg/dL — ABNORMAL HIGH (ref 70–99)
Potassium: 4.3 mmol/L (ref 3.5–5.1)
Sodium: 137 mmol/L (ref 135–145)

## 2020-07-24 LAB — DIGOXIN LEVEL: Digoxin Level: 1.7 ng/mL (ref 1.0–2.0)

## 2020-07-24 LAB — GLUCOSE, CAPILLARY
Glucose-Capillary: 231 mg/dL — ABNORMAL HIGH (ref 70–99)
Glucose-Capillary: 400 mg/dL — ABNORMAL HIGH (ref 70–99)
Glucose-Capillary: 207 mg/dL — ABNORMAL HIGH (ref 70–99)
Glucose-Capillary: 122 mg/dL — ABNORMAL HIGH (ref 70–99)

## 2020-07-24 LAB — CULTURE, RESPIRATORY W GRAM STAIN

## 2020-07-24 LAB — MAGNESIUM: Magnesium: 1.9 mg/dL (ref 1.7–2.4)

## 2020-07-24 MED ORDER — WARFARIN SODIUM 2.5 MG PO TABS
2.5000 mg | ORAL_TABLET | Freq: Once | ORAL | Status: AC
  Administered 2020-07-24: 2.5 mg via ORAL
  Filled 2020-07-24: qty 1

## 2020-07-24 MED ORDER — INSULIN GLARGINE 100 UNIT/ML ~~LOC~~ SOLN
40.0000 [IU] | Freq: Every day | SUBCUTANEOUS | Status: DC
  Administered 2020-07-24 – 2020-07-26 (×3): 40 [IU] via SUBCUTANEOUS
  Filled 2020-07-24 (×4): qty 0.4

## 2020-07-24 MED ORDER — DEXTROSE 5 % IV SOLN
3.0000 mg | Freq: Two times a day (BID) | INTRAVENOUS | Status: DC
  Administered 2020-07-24 – 2020-07-29 (×11): 3 mg via INTRAVENOUS
  Filled 2020-07-24 (×13): qty 12

## 2020-07-24 NOTE — Progress Notes (Signed)
ANTICOAGULATION CONSULT NOTE   Pharmacy Consult for Warfarin Indication: History of VTE   Patient Measurements: Height: 4\' 10"  (147.3 cm) Weight: 96.4 kg (212 lb 8 oz) IBW/kg (Calculated) : 40.9  Vital Signs: Temp: 98.1 F (36.7 C) (11/18 0415) Temp Source: Oral (11/18 0415)  Labs: Recent Labs     0000 07/21/20 1152 07/21/20 1152 07/21/20 1347 07/21/20 2121 07/22/20 0446 07/23/20 0411 07/24/20 0601  HGB  --  11.8*   < >  --   --   --  10.5* 10.6*  HCT  --  38.5  --   --   --   --  35.1* 35.3*  PLT  --  244  --   --   --   --  246 256  LABPROT  --  39.6*  --   --    < > 34.7* 34.5* 26.5*  INR  --  4.2*  --   --    < > 3.6* 3.6* 2.5*  CREATININE   < > 1.12*  --   --   --  1.22* 1.38* 1.27*  TROPONINIHS  --  17  --  17  --   --   --   --    < > = values in this interval not displayed.    Estimated Creatinine Clearance: 50.4 mL/min (A) (by C-G formula based on SCr of 1.27 mg/dL (H)).  Assessment: 54 yr old female presented to [facility] ED with SOB. Medical hx includes lupus anticoagulant with hx of VTE, for which she is on anticoagulation with warfarin PTA (5 mg MWF, 2.5 mg other days of the week; per med rec, last dose was on the evening of 07/20/20). Pt also has recent dx of atrial flutter.   Major Drug Interaction: Target a 25- 50% reduction in weekly warfarin dose after 2 weeks of dual therapy with amiodarone and warfarin  INR 2.5- therapeutic    Goal of Therapy:  INR 2-3 Monitor platelets by anticoagulation protocol: Yes   Plan:  Warfarin 2.5 mg x 1 dose Consider weekly dose reduction of warfarin d/t major interaction w/ amiodarone Daily INR and CBC Monitor for signs/symptoms of bleeding  Margot Ables, PharmD Clinical Pharmacist 07/24/2020 7:42 AM

## 2020-07-24 NOTE — Progress Notes (Addendum)
Physical Therapy Treatment Patient Details Name: Theresa [name] MRN: [id number] DOB: [birth year] Today's Date: 07/24/2020    History of Present Illness Theresa [name] is a 54 y.o. female with medical history significant of combined CHF, COPD, chronic respiratory failure, tracheostomy dependent, lupus anticoagulant with history of VTE on chronic anticoagulation therapy, was recently discharged from hospital on 11/1 after being treated for decompensated CHF.  Patient reports compliance with medications.  She had come back to cardiology office today as hospital follow-up and was noted to have increased weight.  She reports feeling generally weak, feels that her legs are heavier.  She also describes abdominal distention.  She has felt more short of breath.  She has had worsening cough productive of yellowish sputum.  She has noticed that she is wheezing more.  No chest pain.  No fever.  No vomiting.  No abdominal pain.  No diarrhea.  Review of records indicate that her weight is up approximately 6 pounds since her last discharge.  While in the cardiology office, she was noted to be hypoxic at 83% on chronic 4 L of oxygen.  She was sent to the emergency room for evaluation    PT Comments    Patient demonstrates increased endurance/distance for ambulation without loss of balance while on 6 LPM of O2 [place] with SpO2 at 92-93% (RN notified), limited mostly due to c/o fatigue and tolerated sitting up in chair after therapy with her spouse present.  Patient will benefit from continued physical therapy in hospital and recommended venue below to increase strength, balance, endurance for safe ADLs and gait.   Follow Up Recommendations  Home health PT     Equipment Recommendations  None recommended by PT    Recommendations for Other Services       Precautions / Restrictions Precautions Precautions: None Restrictions Weight Bearing Restrictions: No    Mobility  Bed Mobility Overal bed mobility:  Modified Independent       Supine to sit: HOB elevated        Transfers Overall transfer level: Needs assistance Equipment used: None Transfers: Sit to/from Stand;Stand Pivot Transfers Sit to Stand: Supervision;Modified independent (Device/Increase time) Stand pivot transfers: Modified independent (Device/Increase time);Supervision       General transfer comment: slightly labored  Ambulation/Gait Ambulation/Gait assistance: Supervision Gait Distance (Feet): 100 Feet Assistive device: None Gait Pattern/deviations: Decreased step length - right;Decreased step length - left;Decreased stride length Gait velocity: decreased   General Gait Details: increased endurance/distance for ambulation with slightly labored cadence without loss of balance, limited secondary to fatigue, on 6 LPM with SpO2 at 92-93%   Stairs             Wheelchair Mobility    Modified Rankin (Stroke Patients Only)       Balance Overall balance assessment: Needs assistance Sitting-balance support: Feet supported;No upper extremity supported Sitting balance-Leahy Scale: Good Sitting balance - Comments: seated at EOB   Standing balance support: During functional activity;No upper extremity supported Standing balance-Leahy Scale: Fair Standing balance comment: fair/good without AD                            Cognition Arousal/Alertness: Awake/alert Behavior During Therapy: WFL for tasks assessed/performed Overall Cognitive Status: Within Functional Limits for tasks assessed  Exercises General Exercises - Lower Extremity Long Arc Quad: Seated;AROM;Strengthening;Both;15 reps Hip Flexion/Marching: Seated;AROM;Strengthening;Both;15 reps Toe Raises: Seated;AROM;Strengthening;Both;15 reps Heel Raises: Seated;AROM;Strengthening;Both;15 reps    General Comments        Pertinent Vitals/Pain Pain Assessment: 0-10 Pain Score: 6   Pain Location: frontal and top of head headache Pain Descriptors / Indicators: Headache Pain Intervention(s): Limited activity within patient's tolerance;Monitored during session;Premedicated before session    Home Living                      Prior Function            PT Goals (current goals can now be found in the care plan section) Acute Rehab PT Goals Patient Stated Goal: return home with family assist PT Goal Formulation: With patient Time For Goal Achievement: 07/30/20 Potential to Achieve Goals: Good Progress towards PT goals: Progressing toward goals    Frequency    Min 3X/week      PT Plan Current plan remains appropriate    Co-evaluation              AM-PAC PT "6 Clicks" Mobility   Outcome Measure  Help needed turning from your back to your side while in a flat bed without using bedrails?: None Help needed moving from lying on your back to sitting on the side of a flat bed without using bedrails?: A Little Help needed moving to and from a bed to a chair (including a wheelchair)?: None Help needed standing up from a chair using your arms (e.g., wheelchair or bedside chair)?: None Help needed to walk in hospital room?: A Little Help needed climbing 3-5 steps with a railing? : A Little 6 Click Score: 21    End of Session Equipment Utilized During Treatment: Oxygen Activity Tolerance: Patient tolerated treatment well;Patient limited by fatigue Patient left: in chair;with call bell/phone within reach;with family/visitor present Nurse Communication: Mobility status PT Visit Diagnosis: Unsteadiness on feet (R26.81);Other abnormalities of gait and mobility (R26.89);Muscle weakness (generalized) (M62.81)     Time: 2924-4628 PT Time Calculation (min) (ACUTE ONLY): 31 min  Charges:  $Gait Training: 8-22 mins $Therapeutic Exercise: 8-22 mins                     1:46 PM, 07/24/20 Lonell Grandchild, MPT Physical Therapist with [facility] 336 [phone] office [phone] mobile phone

## 2020-07-24 NOTE — Progress Notes (Signed)
PROGRESS NOTE    Theresa [name]  XNT:[id number] DOB: [birth year] DOA: 07/21/2020 PCP: Monico Blitz, MD   Brief Narrative:  54 year old female with a history of combined CHF, COPD, chronic respiratory failure, tracheostomy dependent, lupus anticoagulant with history of VTE on chronic anticoagulation, brought to the hospital with shortness of breath. Admitted for increased weight gain secondary to CHF exacerbation as well as an element of COPD exacerbation.   Assessment & Plan:   Active Problems:   Essential hypertension   COPD exacerbation (HCC)   Lupus anticoagulant syndrome ([place])   Tracheostomy in place [facility]), chronic since 2002   Uncontrolled type 2 diabetes mellitus with hyperglycemia, with long-term current use of insulin (HCC)   Acute on chronic combined systolic and diastolic HF (heart failure) (HCC)   Acute on chronic respiratory failure (HCC)   Acute on chronic respiratory failure with hypoxia-slowly improving with diuresis -Chronically on 4 L of oxygen via nasal cannula at home -Currently requiring 10 L via trach collar -Continue to wean down oxygen as tolerated  Acute on chronic combined CHF-ongoing -Most recent echocardiogram from 07/02/2020 shows ejection fraction 50% -Sister states that patient is "careless" and does not adequately take medications or weigh herself, or watch diet, refuses SNF placement and will need home health RN and aide to assist -Was recently started on 2 mg twice daily Bumex on recent admission -IV Lasix changed to IV Bumex 3 mg twice daily based on home dosages as she continues to have ongoing volume overload, but has had -1.6 L output over the last 24 hours, avoid metolazone use for now -Mild downtrend in weight to 212 pounds with baseline near 200 pounds -She still has evidence of volume overload -Continue to monitor  COPD exacerbation -Shecontinues tohave wheezing and cough with tenacious tracheostomy secretions -Continue on  bronchodilators -Antibiotics withdoxycycline -Continue IV steroids -Continue pulmonary hygiene  Type 2 diabetes -With steroid induced hyperglycemia -Continue on basal insulin, increased dose to 40 units daily -Added NovoLog 3 times daily 5 units -Continue sliding scale insulin  Lupus anticoagulant syndrome with history of VTE -Continue anticoagulation with warfarin  Recent diagnosis of atrial flutter -Continue on amiodarone -She is anticoagulated -She is also on digoxin with supratherapeutic levels.  -Digoxin level now 1.7, but continue to hold given some bradycardia-possibly restart by a.m.   DVT prophylaxis:Coumadin Code Status:Full code Family Communication:Discussed with sister on phone 11/17 Disposition Plan:Status is: Inpatient  Remains inpatient appropriate because:Inpatient level of care appropriate due to severity of illness   Dispo: The patient is from:Home Anticipated d/c is HQ:PRFF with [facility] Anticipated d/c date is: 2-3 days Patient currently is not medically stable to d/c.  She continues to have significant hypoxemia and volume overload requiring aggressive diuresis.  Antimicrobials:  Anti-infectives (From admission, onward)   Start     Dose/Rate Route Frequency Ordered Stop   07/21/20 2000  doxycycline (VIBRA-TABS) tablet 100 mg        100 mg Oral Every 12 hours 07/21/20 1825 07/26/20 1959   07/21/20 1515  levofloxacin (LEVAQUIN) IVPB 750 mg        750 mg 100 mL/hr over 90 Minutes Intravenous  Once 07/21/20 1512 07/21/20 1654       Subjective: Patient seen and evaluated today with no new acute complaints or concerns. No acute concerns or events noted overnight. She is breathing some better and has had more urine output.  Objective: Vitals:   07/24/20 0415 07/24/20 0445 07/24/20 0748 07/24/20 0816  BP:    Marland Kitchen)  145/63  Pulse:    (!) 57  Resp:    16  Temp: 98.1 F (36.7 C)  98.9 F (37.2 C)    TempSrc: Oral  Axillary   SpO2:    90%  Weight:  96.4 kg    Height:        Intake/Output Summary (Last 24 hours) at 07/24/2020 0851 Last data filed at 07/24/2020 0749 Gross per 24 hour  Intake 298.12 ml  Output 1925 ml  Net -1626.88 ml   Filed Weights   07/22/20 0500 07/23/20 0500 07/24/20 0445  Weight: 97.6 kg 97.6 kg 96.4 kg    Examination:  General exam: Appears calm and comfortable, obese Respiratory system: Clear to auscultation. Respiratory effort normal.  Trach with 10 L trach collar Cardiovascular system: S1 & S2 heard, RRR.  Gastrointestinal system: Abdomen is nondistended, soft and nontender.  Central nervous system: Alert and oriented. No focal neurological deficits. Extremities: Mild bilateral edema Skin: No rashes, lesions or ulcers Psychiatry: Flat affect    Data Reviewed: I have personally reviewed following labs and imaging studies  CBC: Recent Labs  Lab 07/21/20 1152 07/23/20 0411 07/24/20 0601  WBC 6.8 6.8 7.3  NEUTROABS 5.4  --   --   HGB 11.8* 10.5* 10.6*  HCT 38.5 35.1* 35.3*  MCV 102.7* 104.2* 104.1*  PLT 244 246 564   Basic Metabolic Panel: Recent Labs  Lab 07/21/20 1152 07/22/20 0446 07/23/20 0411 07/24/20 0601  NA 137 138 137 137  K 4.7 4.8 4.8 4.3  CL 92* 92* 90* 89*  CO2 34* 37* 37* 38*  GLUCOSE 229* 300* 257* 277*  BUN 17 21* 30* 37*  CREATININE 1.12* 1.22* 1.38* 1.27*  CALCIUM 8.6* 8.7* 8.7* 8.5*  MG  --   --   --  1.9   GFR: Estimated Creatinine Clearance: 50.4 mL/min (A) (by C-G formula based on SCr of 1.27 mg/dL (H)). Liver Function Tests: No results for input(s): AST, ALT, ALKPHOS, BILITOT, PROT, ALBUMIN in the last 168 hours. No results for input(s): LIPASE, AMYLASE in the last 168 hours. No results for input(s): AMMONIA in the last 168 hours. Coagulation Profile: Recent Labs  Lab 07/21/20 1152 07/21/20 2121 07/22/20 0446 07/23/20 0411 07/24/20 0601  INR 4.2* 3.8* 3.6* 3.6* 2.5*   Cardiac Enzymes: No  results for input(s): CKTOTAL, CKMB, CKMBINDEX, TROPONINI in the last 168 hours. BNP (last 3 results) No results for input(s): PROBNP in the last 8760 hours. HbA1C: No results for input(s): HGBA1C in the last 72 hours. CBG: Recent Labs  Lab 07/23/20 0734 07/23/20 1114 07/23/20 1605 07/23/20 2042 07/24/20 0714  GLUCAP 202* 291* 127* 129* 231*   Lipid Profile: No results for input(s): CHOL, HDL, LDLCALC, TRIG, CHOLHDL, LDLDIRECT in the last 72 hours. Thyroid Function Tests: No results for input(s): TSH, T4TOTAL, FREET4, T3FREE, THYROIDAB in the last 72 hours. Anemia Panel: No results for input(s): VITAMINB12, FOLATE, FERRITIN, TIBC, IRON, RETICCTPCT in the last 72 hours. Sepsis Labs: Recent Labs  Lab 07/21/20 1153 07/21/20 1347  LATICACIDVEN 2.9* 1.7    Recent Results (from the past 240 hour(s))  Respiratory Panel by RT PCR (Flu A&B, Covid) - Nasopharyngeal Swab     Status: None   Collection Time: 07/21/20 11:19 AM   Specimen: Nasopharyngeal Swab  Result Value Ref Range Status   SARS Coronavirus 2 by RT PCR NEGATIVE NEGATIVE Final    Comment: (NOTE) SARS-CoV-2 target nucleic acids are NOT DETECTED.  The SARS-CoV-2 RNA is generally detectable in upper  respiratoy specimens during the acute phase of infection. The lowest concentration of SARS-CoV-2 viral copies this assay can detect is 131 copies/mL. A negative result does not preclude SARS-Cov-2 infection and should not be used as the sole basis for treatment or other patient management decisions. A negative result may occur with  improper specimen collection/handling, submission of specimen other than nasopharyngeal swab, presence of viral mutation(s) within the areas targeted by this assay, and inadequate number of viral copies (<131 copies/mL). A negative result must be combined with clinical observations, patient history, and epidemiological information. The expected result is Negative.  Fact Sheet for Patients:    PinkCheek.be  Fact Sheet for Healthcare Providers:  GravelBags.it  This test is no t yet approved or cleared by the [place] FDA and  has been authorized for detection and/or diagnosis of SARS-CoV-2 by FDA under an Emergency Use Authorization (EUA). This EUA will remain  in effect (meaning this test can be used) for the duration of the COVID-19 declaration under Section 564(b)(1) of the Act, 21 U.S.C. section 360bbb-3(b)(1), unless the authorization is terminated or revoked sooner.     Influenza A by PCR NEGATIVE NEGATIVE Final   Influenza B by PCR NEGATIVE NEGATIVE Final    Comment: (NOTE) The Xpert Xpress SARS-CoV-2/FLU/RSV assay is intended as an aid in  the diagnosis of influenza from Nasopharyngeal swab specimens and  should not be used as a sole basis for treatment. Nasal washings and  aspirates are unacceptable for Xpert Xpress SARS-CoV-2/FLU/RSV  testing.  Fact Sheet for Patients: PinkCheek.be  Fact Sheet for Healthcare Providers: GravelBags.it  This test is not yet approved or cleared by the [place] FDA and  has been authorized for detection and/or diagnosis of SARS-CoV-2 by  FDA under an Emergency Use Authorization (EUA). This EUA will remain  in effect (meaning this test can be used) for the duration of the  Covid-19 declaration under Section 564(b)(1) of the Act, 21  U.S.C. section 360bbb-3(b)(1), unless the authorization is  terminated or revoked. Performed at [facility], [address] [place], [place] [address]   Culture, blood (routine x 2)     Status: None (Preliminary result)   Collection Time: 07/21/20 11:52 AM   Specimen: BLOOD  Result Value Ref Range Status   Specimen Description BLOOD SITE NOT SPECIFIED DRAWN BY RN  Final   Special Requests   Final    BOTTLES DRAWN AEROBIC AND ANAEROBIC Blood Culture adequate volume    Culture   Final    NO GROWTH 2 DAYS Performed at [facility], [address] [place], [place] [address]    Report Status PENDING  Incomplete  Culture, blood (routine x 2)     Status: None (Preliminary result)   Collection Time: 07/21/20 12:41 PM   Specimen: BLOOD  Result Value Ref Range Status   Specimen Description BLOOD SITE NOT SPECIFIED  Final   Special Requests   Final    BOTTLES DRAWN AEROBIC AND ANAEROBIC Blood Culture results may not be optimal due to an inadequate volume of blood received in culture bottles   Culture   Final    NO GROWTH 2 DAYS Performed at [facility], [address], [place], [place] [address]    Report Status PENDING  Incomplete  Culture, respiratory (non-expectorated)     Status: None (Preliminary result)   Collection Time: 07/21/20  3:11 PM   Specimen: Tracheal Aspirate; Respiratory  Result Value Ref Range Status   Specimen Description   Final  TRACHEAL ASPIRATE Performed at [facility], [address], [place], [place] [address]    Special Requests   Final    NONE Performed at [facility], [address], [place], [place] [address]    Gram Stain   Final    RARE WBC PRESENT,BOTH PMN AND MONONUCLEAR RARE GRAM POSITIVE RODS RARE GRAM NEGATIVE RODS    Culture   Final    RARE PSEUDOMONAS AERUGINOSA SUSCEPTIBILITIES TO FOLLOW Performed at [facility], [place] [address] [place], [place] [address]    Report Status PENDING  Incomplete         Radiology Studies: No results found.      Scheduled Meds: . amiodarone  200 mg Oral Daily  . budesonide (PULMICORT) nebulizer solution  1 mg Nebulization BID  . Chlorhexidine Gluconate Cloth  6 each Topical Daily  . doxycycline  100 mg Oral Q12H  . gabapentin  900 mg Oral QHS  . insulin aspart  0-20 Units Subcutaneous TID WC  . insulin aspart  0-5 Units Subcutaneous QHS  . insulin aspart  5 Units Subcutaneous TID WC  . insulin glargine  35 Units Subcutaneous QHS  .  ipratropium-albuterol  3 mL Nebulization Q6H  . methylPREDNISolone (SOLU-MEDROL) injection  60 mg Intravenous Q12H  . pantoprazole  40 mg Oral Daily  . potassium chloride SA  40 mEq Oral Daily  . sertraline  50 mg Oral QHS  . tamsulosin  0.4 mg Oral Daily  . warfarin  2.5 mg Oral ONCE-1600  . Warfarin - Pharmacist Dosing Inpatient   Does not apply q1600   Continuous Infusions: . bumetanide (BUMEX) IV 3 mg (07/24/20 0821)     LOS: 3 days    Time spent: 30 minutes    Hisayo Delossantos Darleen Crocker, DO [facility]  If 7PM-7AM, please contact night-coverage [email or web address] 07/24/2020, 8:51 AM

## 2020-07-24 NOTE — TOC Progression Note (Signed)
Transition of Care [facility]) - Progression Note    Patient Details  Name: Theresa [name] MRN: [id number] Date of Birth: 02/23/1966  Transition of Care [facility] CM/SW Contact  Boneta Lucks, RN Phone Number: 07/24/2020, 11:12 AM  Clinical Narrative:   RN called TOC to assist patient with CAP application. TOC meet with patient and husband. Patient does not have medicaid. PCP gave CAP application. Assisted with questions and referred patient to Tito Dine, Music therapist for [employer].   Expected Discharge Plan: [place] Barriers to Discharge: Continued Medical Work up  Expected Discharge Plan and Services Expected Discharge Plan: [place] arrangements for the past 2 months: Single Family Home                   Readmission Risk Interventions Readmission Risk Prevention Plan 07/22/2020 05/17/2019  Transportation Screening Complete Complete  PCP or Specialist Appt within 3-5 Days - Complete  HRI or [place] - Complete  Social Work Consult for [place] Planning/Counseling - Complete  Palliative Care Screening - Not Applicable  Medication Review Press photographer) Complete Complete  Palliative Care Screening Not Complete -  [place] Not Applicable -  Some recent data might be hidden

## 2020-07-25 DIAGNOSIS — J9621 Acute and chronic respiratory failure with hypoxia: Secondary | ICD-10-CM | POA: Diagnosis not present

## 2020-07-25 DIAGNOSIS — I2510 Atherosclerotic heart disease of native coronary artery without angina pectoris: Secondary | ICD-10-CM | POA: Diagnosis not present

## 2020-07-25 DIAGNOSIS — I480 Paroxysmal atrial fibrillation: Secondary | ICD-10-CM | POA: Diagnosis not present

## 2020-07-25 DIAGNOSIS — J441 Chronic obstructive pulmonary disease with (acute) exacerbation: Secondary | ICD-10-CM | POA: Diagnosis not present

## 2020-07-25 DIAGNOSIS — I110 Hypertensive heart disease with heart failure: Secondary | ICD-10-CM | POA: Diagnosis not present

## 2020-07-25 DIAGNOSIS — I5043 Acute on chronic combined systolic (congestive) and diastolic (congestive) heart failure: Secondary | ICD-10-CM | POA: Diagnosis not present

## 2020-07-25 LAB — GLUCOSE, CAPILLARY
Glucose-Capillary: 125 mg/dL — ABNORMAL HIGH (ref 70–99)
Glucose-Capillary: 282 mg/dL — ABNORMAL HIGH (ref 70–99)
Glucose-Capillary: 199 mg/dL — ABNORMAL HIGH (ref 70–99)
Glucose-Capillary: 116 mg/dL — ABNORMAL HIGH (ref 70–99)

## 2020-07-25 LAB — CBC
HCT: 38.1 % (ref 36.0–46.0)
Hemoglobin: 11.7 g/dL — ABNORMAL LOW (ref 12.0–15.0)
MCH: 31.8 pg (ref 26.0–34.0)
MCHC: 30.7 g/dL (ref 30.0–36.0)
MCV: 103.5 fL — ABNORMAL HIGH (ref 80.0–100.0)
Platelets: 272 10*3/uL (ref 150–400)
RBC: 3.68 MIL/uL — ABNORMAL LOW (ref 3.87–5.11)
RDW: 17.2 % — ABNORMAL HIGH (ref 11.5–15.5)
WBC: 7.6 10*3/uL (ref 4.0–10.5)
nRBC: 0 % (ref 0.0–0.2)

## 2020-07-25 LAB — BASIC METABOLIC PANEL
Anion gap: 11 (ref 5–15)
BUN: 40 mg/dL — ABNORMAL HIGH (ref 6–20)
CO2: 41 mmol/L — ABNORMAL HIGH (ref 22–32)
Calcium: 8.8 mg/dL — ABNORMAL LOW (ref 8.9–10.3)
Chloride: 88 mmol/L — ABNORMAL LOW (ref 98–111)
Creatinine, Ser: 1.12 mg/dL — ABNORMAL HIGH (ref 0.44–1.00)
GFR, Estimated: 58 mL/min — ABNORMAL LOW (ref >60)
Glucose, Bld: 150 mg/dL — ABNORMAL HIGH (ref 70–99)
Potassium: 4.5 mmol/L (ref 3.5–5.1)
Sodium: 140 mmol/L (ref 135–145)

## 2020-07-25 LAB — PROTIME-INR
INR: 2.7 — ABNORMAL HIGH (ref 0.8–1.2)
Prothrombin Time: 27.5 seconds — ABNORMAL HIGH (ref 11.4–15.2)

## 2020-07-25 LAB — MAGNESIUM: Magnesium: 2 mg/dL (ref 1.7–2.4)

## 2020-07-25 MED ORDER — WARFARIN SODIUM 1 MG PO TABS
1.0000 mg | ORAL_TABLET | Freq: Once | ORAL | Status: AC
  Administered 2020-07-25: 1 mg via ORAL
  Filled 2020-07-25: qty 1

## 2020-07-25 NOTE — Progress Notes (Signed)
ANTICOAGULATION CONSULT NOTE   Pharmacy Consult for Warfarin Indication: History of VTE   Patient Measurements: Height: 4\' 10"  (147.3 cm) Weight: 98 kg (216 lb 0.8 oz) IBW/kg (Calculated) : 40.9  Vital Signs: Temp: 97.2 F (36.2 C) (11/19 0805) Temp Source: Oral (11/19 0805) Pulse Rate: 58 (11/19 0805)  Labs: Recent Labs    07/23/20 0411 07/23/20 0411 07/24/20 0601 07/25/20 0401  HGB 10.5*   < > 10.6* 11.7*  HCT 35.1*  --  35.3* 38.1  PLT 246  --  256 272  LABPROT 34.5*  --  26.5* 27.5*  INR 3.6*  --  2.5* 2.7*  CREATININE 1.38*  --  1.27* 1.12*   < > = values in this interval not displayed.    Estimated Creatinine Clearance: 57.7 mL/min (A) (by C-G formula based on SCr of 1.12 mg/dL (H)).  Assessment: 54 yr old female presented to [facility] ED with SOB. Medical hx includes lupus anticoagulant with hx of VTE, for which she is on anticoagulation with warfarin PTA (5 mg MWF, 2.5 mg other days of the week; per med rec, last dose was on the evening of 07/20/20). Pt also has recent dx of atrial flutter.   Major Drug Interaction: Target a 25- 50% reduction in weekly warfarin dose after 2 weeks of dual therapy with amiodarone and warfarin  INR 2.7- therapeutic    Goal of Therapy:  INR 2-3 Monitor platelets by anticoagulation protocol: Yes   Plan:  Warfarin 1 mg x 1 dose Consider weekly dose reduction of warfarin d/t major interaction w/ amiodarone Daily INR and CBC Monitor for signs/symptoms of bleeding  Margot Ables, PharmD Clinical Pharmacist 07/25/2020 10:06 AM

## 2020-07-25 NOTE — Progress Notes (Signed)
PROGRESS NOTE    Theresa [name]  DGL:[id number] DOB: June 23, 1966 DOA: 07/21/2020 PCP: Monico Blitz, MD   Brief Narrative:  54 year old female with a history of combined CHF, COPD, chronic respiratory failure, tracheostomy dependent, lupus anticoagulant with history of VTE on chronic anticoagulation, brought to the hospital with shortness of breath. Admitted for increased weight gain secondary to CHF exacerbation as well as an element of COPD exacerbation.   Assessment & Plan:   Active Problems:   Essential hypertension   COPD exacerbation (HCC)   Lupus anticoagulant syndrome ([place])   Tracheostomy in place [facility]), chronic since 2002   Uncontrolled type 2 diabetes mellitus with hyperglycemia, with long-term current use of insulin (HCC)   Acute on chronic combined systolic and diastolic HF (heart failure) (HCC)   Acute on chronic respiratory failure (HCC)  Acute on chronic respiratory failure with hypoxia-slowly improving with diuresis -Chronically on 4 L of oxygen via nasal cannula at home -Currently requiring  8 L via trach collar -Continue to wean down oxygen as tolerated  Acute on chronic combined CHF-ongoing -Most recent echocardiogram from 07/02/2020 shows ejection fraction 50% -Sister states that patient is "careless" and does not adequately take medications or weigh herself, or watch diet, refuses SNF placement and will need home health RN and aide to assist -Was recently started on 2 mg twice daily Bumex on recent admission -IV Lasix changed to IV Bumex 3 mg twice daily based on home dosages as she continues to have ongoing volume overload, but has had -2.6 L output over the last 24 hours, avoid metolazone use for now -Questionable accuracy of weights -She still has evidence of volume overload -Continue to monitor  COPD exacerbation -Shecontinues tohave wheezing and cough with tenacious tracheostomy secretions -Continue on bronchodilators -Antibiotics  withdoxycycline -Continue IV steroids until further tapered -Continue pulmonary hygiene  Type 2 diabetes -With steroid induced hyperglycemia -Continue on basal insulin, increased dose to 40 units daily -Added NovoLog 3 times daily 5 units, may increase with further hyperglycemia noted to 9 units as recommended by coordinator -Continue sliding scale insulin  Lupus anticoagulant syndrome with history of VTE -Continue anticoagulation with warfarin  Recent diagnosis of atrial flutter -Continue on amiodarone -She is anticoagulated -She is also on digoxin with supratherapeutic levels.  -Digoxin level now 1.7, but continue to hold given some bradycardia-possibly restart by a.m.   DVT prophylaxis:Coumadin Code Status:Full code Family Communication:Discussed with husband at bedside 11/19 Disposition Plan:Status is: Inpatient  Remains inpatient appropriate because:Inpatient level of care appropriate due to severity of illness   Dispo: The patient is from:Home Anticipated d/c is JJ:[facility] Anticipated d/c date is: 2-3days Patient currently is not medically stable to d/c.She continues to have significant hypoxemia and volume overload requiring aggressive diuresis.  Antimicrobials:   None   Subjective: Patient seen and evaluated today with no new acute complaints or concerns. No acute concerns or events noted overnight.  She appears to be breathing somewhat better.  Objective: Vitals:   07/25/20 0900 07/25/20 1000 07/25/20 1110 07/25/20 1202  BP: (!) 151/87 130/70 132/72 (!) 137/91  Pulse: 64 (!) 55 62 (!) 55  Resp: 13 14 (!) 23 15  Temp:    (!) 97.2 F (36.2 C)  TempSrc:    Oral  SpO2: 94% 90% 93% 97%  Weight:      Height:        Intake/Output Summary (Last 24 hours) at 07/25/2020 1230 Last data filed at 07/25/2020 0534 Gross per 24 hour  Intake  67.12 ml  Output 1000 ml  Net -932.88 ml   Filed Weights    07/24/20 0445 07/25/20 0500 07/25/20 0805  Weight: 96.4 kg 98.7 kg 98 kg    Examination:  General exam: Appears calm and comfortable, obese Respiratory system: Clear to auscultation. Respiratory effort normal.  Trach with trach collar and nasal cannula oxygen ongoing Cardiovascular system: S1 & S2 heard, RRR.  Gastrointestinal system: Abdomen is nondistended, soft and nontender. Central nervous system: Alert and oriented. No focal neurological deficits. Extremities: Scant bilateral edema Skin: No rashes, lesions or ulcers Psychiatry: Flat affect    Data Reviewed: I have personally reviewed following labs and imaging studies  CBC: Recent Labs  Lab 07/21/20 1152 07/23/20 0411 07/24/20 0601 07/25/20 0401  WBC 6.8 6.8 7.3 7.6  NEUTROABS 5.4  --   --   --   HGB 11.8* 10.5* 10.6* 11.7*  HCT 38.5 35.1* 35.3* 38.1  MCV 102.7* 104.2* 104.1* 103.5*  PLT 244 246 256 229   Basic Metabolic Panel: Recent Labs  Lab 07/21/20 1152 07/22/20 0446 07/23/20 0411 07/24/20 0601 07/25/20 0401  NA 137 138 137 137 140  K 4.7 4.8 4.8 4.3 4.5  CL 92* 92* 90* 89* 88*  CO2 34* 37* 37* 38* 41*  GLUCOSE 229* 300* 257* 277* 150*  BUN 17 21* 30* 37* 40*  CREATININE 1.12* 1.22* 1.38* 1.27* 1.12*  CALCIUM 8.6* 8.7* 8.7* 8.5* 8.8*  MG  --   --   --  1.9 2.0   GFR: Estimated Creatinine Clearance: 57.7 mL/min (A) (by C-G formula based on SCr of 1.12 mg/dL (H)). Liver Function Tests: No results for input(s): AST, ALT, ALKPHOS, BILITOT, PROT, ALBUMIN in the last 168 hours. No results for input(s): LIPASE, AMYLASE in the last 168 hours. No results for input(s): AMMONIA in the last 168 hours. Coagulation Profile: Recent Labs  Lab 07/21/20 2121 07/22/20 0446 07/23/20 0411 07/24/20 0601 07/25/20 0401  INR 3.8* 3.6* 3.6* 2.5* 2.7*   Cardiac Enzymes: No results for input(s): CKTOTAL, CKMB, CKMBINDEX, TROPONINI in the last 168 hours. BNP (last 3 results) No results for input(s): PROBNP in the  last 8760 hours. HbA1C: No results for input(s): HGBA1C in the last 72 hours. CBG: Recent Labs  Lab 07/24/20 1120 07/24/20 1621 07/24/20 2122 07/25/20 0803 07/25/20 1201  GLUCAP 400* 207* 122* 125* 282*   Lipid Profile: No results for input(s): CHOL, HDL, LDLCALC, TRIG, CHOLHDL, LDLDIRECT in the last 72 hours. Thyroid Function Tests: No results for input(s): TSH, T4TOTAL, FREET4, T3FREE, THYROIDAB in the last 72 hours. Anemia Panel: No results for input(s): VITAMINB12, FOLATE, FERRITIN, TIBC, IRON, RETICCTPCT in the last 72 hours. Sepsis Labs: Recent Labs  Lab 07/21/20 1153 07/21/20 1347  LATICACIDVEN 2.9* 1.7    Recent Results (from the past 240 hour(s))  Respiratory Panel by RT PCR (Flu A&B, Covid) - Nasopharyngeal Swab     Status: None   Collection Time: 07/21/20 11:19 AM   Specimen: Nasopharyngeal Swab  Result Value Ref Range Status   SARS Coronavirus 2 by RT PCR NEGATIVE NEGATIVE Final    Comment: (NOTE) SARS-CoV-2 target nucleic acids are NOT DETECTED.  The SARS-CoV-2 RNA is generally detectable in upper respiratoy specimens during the acute phase of infection. The lowest concentration of SARS-CoV-2 viral copies this assay can detect is 131 copies/mL. A negative result does not preclude SARS-Cov-2 infection and should not be used as the sole basis for treatment or other patient management decisions. A negative result may occur  with  improper specimen collection/handling, submission of specimen other than nasopharyngeal swab, presence of viral mutation(s) within the areas targeted by this assay, and inadequate number of viral copies (<131 copies/mL). A negative result must be combined with clinical observations, patient history, and epidemiological information. The expected result is Negative.  Fact Sheet for Patients:  PinkCheek.be  Fact Sheet for Healthcare Providers:  GravelBags.it  This test is no  t yet approved or cleared by the [place] FDA and  has been authorized for detection and/or diagnosis of SARS-CoV-2 by FDA under an Emergency Use Authorization (EUA). This EUA will remain  in effect (meaning this test can be used) for the duration of the COVID-19 declaration under Section 564(b)(1) of the Act, 21 U.S.C. section 360bbb-3(b)(1), unless the authorization is terminated or revoked sooner.     Influenza A by PCR NEGATIVE NEGATIVE Final   Influenza B by PCR NEGATIVE NEGATIVE Final    Comment: (NOTE) The Xpert Xpress SARS-CoV-2/FLU/RSV assay is intended as an aid in  the diagnosis of influenza from Nasopharyngeal swab specimens and  should not be used as a sole basis for treatment. Nasal washings and  aspirates are unacceptable for Xpert Xpress SARS-CoV-2/FLU/RSV  testing.  Fact Sheet for Patients: PinkCheek.be  Fact Sheet for Healthcare Providers: GravelBags.it  This test is not yet approved or cleared by the [place] FDA and  has been authorized for detection and/or diagnosis of SARS-CoV-2 by  FDA under an Emergency Use Authorization (EUA). This EUA will remain  in effect (meaning this test can be used) for the duration of the  Covid-19 declaration under Section 564(b)(1) of the Act, 21  U.S.C. section 360bbb-3(b)(1), unless the authorization is  terminated or revoked. Performed at [facility], [address], [place], [place] [address]   Culture, blood (routine x 2)     Status: None (Preliminary result)   Collection Time: 07/21/20 11:52 AM   Specimen: BLOOD  Result Value Ref Range Status   Specimen Description BLOOD SITE NOT SPECIFIED DRAWN BY RN  Final   Special Requests   Final    BOTTLES DRAWN AEROBIC AND ANAEROBIC Blood Culture adequate volume   Culture   Final    NO GROWTH 4 DAYS Performed at [facility], [address] [place], [place] [address]    Report Status PENDING  Incomplete   Culture, blood (routine x 2)     Status: None (Preliminary result)   Collection Time: 07/21/20 12:41 PM   Specimen: BLOOD  Result Value Ref Range Status   Specimen Description BLOOD SITE NOT SPECIFIED  Final   Special Requests   Final    BOTTLES DRAWN AEROBIC AND ANAEROBIC Blood Culture results may not be optimal due to an inadequate volume of blood received in culture bottles   Culture   Final    NO GROWTH 4 DAYS Performed at [facility], [address] [place], [place] [address]    Report Status PENDING  Incomplete  Culture, respiratory (non-expectorated)     Status: None   Collection Time: 07/21/20  3:11 PM   Specimen: Tracheal Aspirate; Respiratory  Result Value Ref Range Status   Specimen Description   Final    TRACHEAL ASPIRATE Performed at [facility], [address], [place],  [address]    Special Requests   Final    NONE Performed at [facility], [address], [place],  [address]    Gram Stain   Final    RARE WBC PRESENT,BOTH PMN  AND MONONUCLEAR RARE GRAM POSITIVE RODS RARE GRAM NEGATIVE RODS Performed at [facility], [place] [address], [place], [place] [address]    Culture RARE PSEUDOMONAS AERUGINOSA  Final   Report Status 07/24/2020 FINAL  Final   Organism ID, Bacteria PSEUDOMONAS AERUGINOSA  Final      Susceptibility   Pseudomonas aeruginosa - MIC*    CEFTAZIDIME 4 SENSITIVE Sensitive     CIPROFLOXACIN <=0.25 SENSITIVE Sensitive     GENTAMICIN <=1 SENSITIVE Sensitive     IMIPENEM 1 SENSITIVE Sensitive     PIP/TAZO 8 SENSITIVE Sensitive     CEFEPIME 2 SENSITIVE Sensitive     * RARE PSEUDOMONAS AERUGINOSA         Radiology Studies: No results found.      Scheduled Meds: . amiodarone  200 mg Oral Daily  . budesonide (PULMICORT) nebulizer solution  1 mg Nebulization BID  . Chlorhexidine Gluconate Cloth  6 each Topical Daily  . doxycycline  100 mg Oral Q12H  . gabapentin  900 mg Oral QHS  . insulin aspart  0-20 Units  Subcutaneous TID WC  . insulin aspart  0-5 Units Subcutaneous QHS  . insulin aspart  5 Units Subcutaneous TID WC  . insulin glargine  40 Units Subcutaneous QHS  . ipratropium-albuterol  3 mL Nebulization Q6H  . methylPREDNISolone (SOLU-MEDROL) injection  60 mg Intravenous Q12H  . pantoprazole  40 mg Oral Daily  . potassium chloride SA  40 mEq Oral Daily  . sertraline  50 mg Oral QHS  . tamsulosin  0.4 mg Oral Daily  . warfarin  1 mg Oral ONCE-1600  . Warfarin - Pharmacist Dosing Inpatient   Does not apply q1600   Continuous Infusions: . bumetanide (BUMEX) IV 3 mg (07/25/20 1008)     LOS: 4 days    Time spent: 30 minutes    Turner Kunzman Darleen Crocker, DO [facility]  If 7PM-7AM, please contact night-coverage [email or web address] 07/25/2020, 12:30 PM

## 2020-07-25 NOTE — Progress Notes (Signed)
Inpatient Diabetes Program Recommendations  AACE/ADA: New Consensus Statement on Inpatient Glycemic Control  Target Ranges:  Prepandial:   less than 140 mg/dL      Peak postprandial:   less than 180 mg/dL (1-2 hours)      Critically ill patients:  140 - 180 mg/dL  Results for [name], [name] (MRN [id number]) as of 07/25/2020 07:44  Ref. Range 07/25/2020 04:01  Glucose Latest Ref Range: 70 - 99 mg/dL 150 (H)   Results for [name], [name] (MRN [id number]) as of 07/25/2020 07:44  Ref. Range 07/24/2020 07:14 07/24/2020 11:20 07/24/2020 16:21 07/24/2020 21:22  Glucose-Capillary Latest Ref Range: 70 - 99 mg/dL 231 (H) 400 (H) 207 (H) 122 (H)   Review of Glycemic Control  Diabetes history:DM2 Outpatient Diabetes medications:Tresiba 30unitsQHS, Novolog 0-10unitsTID, Ozempic 0.5 mg Qweek (Wednesday). Current orders for Inpatient glycemic control:Novolog 0-20 units TID,Novolog0-5 HS, Lantus 40unitsQHS, Novolog 5 units TID with meals for meal coverage; Solumedrol 60 mg Q12H   Inpatient Diabetes Program Recommendations:    Insulin: If steroids are continued as ordered, please consider increasing meal coverage to Novolog 9 units TID with meals if patient eats at least 50% of meals.  Thanks, Barnie Alderman, RN, MSN, CDE Diabetes Coordinator Inpatient Diabetes Program [phone] (Team Pager from 8am to 5pm)

## 2020-07-25 NOTE — Progress Notes (Signed)
Physical Therapy Treatment Patient Details Name: Theresa [name] MRN: [id number] DOB: May 23, 1966 Today's Date: 07/25/2020    History of Present Illness Theresa [name] is a 54 y.o. female with medical history significant of combined CHF, COPD, chronic respiratory failure, tracheostomy dependent, lupus anticoagulant with history of VTE on chronic anticoagulation therapy, was recently discharged from hospital on 11/1 after being treated for decompensated CHF.  Patient reports compliance with medications.  She had come back to cardiology office today as hospital follow-up and was noted to have increased weight.  She reports feeling generally weak, feels that her legs are heavier.  She also describes abdominal distention.  She has felt more short of breath.  She has had worsening cough productive of yellowish sputum.  She has noticed that she is wheezing more.  No chest pain.  No fever.  No vomiting.  No abdominal pain.  No diarrhea.  Review of records indicate that her weight is up approximately 6 pounds since her last discharge.  While in the cardiology office, she was noted to be hypoxic at 83% on chronic 4 L of oxygen.  She was sent to the emergency room for evaluation    PT Comments    Patient demonstrates improved functional activity tolerance today tolerating increased ambulation distances and able to perform total of 3 trials without adverse effects.  Vitals signs maintained WNL throughout exertion and no undue fatigue experienced during session.  Patient will benefit from continued physical therapy in hospital and recommended venue below to increase strength, balance, endurance for safe ADLs and gait.    Follow Up Recommendations  Home health PT     Equipment Recommendations  None recommended by PT    Recommendations for Other Services       Precautions / Restrictions Precautions Precautions: None Restrictions Weight Bearing Restrictions: No    Mobility  Bed Mobility                   Transfers Overall transfer level: Needs assistance Equipment used: None Transfers: Sit to/from Stand;Stand Pivot Transfers Sit to Stand: Supervision;Modified independent (Device/Increase time) Stand pivot transfers: Modified independent (Device/Increase time);Supervision          Ambulation/Gait Ambulation/Gait assistance: Supervision Gait Distance (Feet): 130 Feet Assistive device: Rolling walker (2 wheeled) Gait Pattern/deviations: Decreased step length - right;Decreased step length - left;Decreased stride length Gait velocity: decreased   General Gait Details: increased endurance/distance for ambulation with slightly labored cadence without loss of balance, limited secondary to fatigue, on 4 LPM with SpO2 at 92-93%   Stairs             Wheelchair Mobility    Modified Rankin (Stroke Patients Only)       Balance                                            Cognition Arousal/Alertness: Awake/alert Behavior During Therapy: WFL for tasks assessed/performed Overall Cognitive Status: Within Functional Limits for tasks assessed                                        Exercises      General Comments        Pertinent Vitals/Pain Pain Assessment: No/denies pain    Home Living  Prior Function            PT Goals (current goals can now be found in the care plan section) Acute Rehab PT Goals Patient Stated Goal: return home with family assist PT Goal Formulation: With patient Time For Goal Achievement: 07/30/20 Potential to Achieve Goals: Good Progress towards PT goals: Progressing toward goals    Frequency    Min 3X/week      PT Plan Current plan remains appropriate    Co-evaluation              AM-PAC PT "6 Clicks" Mobility   Outcome Measure                   End of Session Equipment Utilized During Treatment: Oxygen Activity Tolerance: Patient  tolerated treatment well;Patient limited by fatigue Patient left: in chair;with call bell/phone within reach;with family/visitor present Nurse Communication: Mobility status PT Visit Diagnosis: Unsteadiness on feet (R26.81);Other abnormalities of gait and mobility (R26.89);Muscle weakness (generalized) (M62.81)     Time: 1015-1050 PT Time Calculation (min) (ACUTE ONLY): 35 min  Charges:  $Gait Training: 8-22 mins $Therapeutic Activity: 8-22 mins                     11:20 AM, 07/25/20 M. Sherlyn Lees, PT, DPT Physical Therapist- [place] Office Number: [phone]

## 2020-07-26 DIAGNOSIS — I5043 Acute on chronic combined systolic (congestive) and diastolic (congestive) heart failure: Secondary | ICD-10-CM | POA: Diagnosis not present

## 2020-07-26 LAB — BASIC METABOLIC PANEL
Anion gap: 12 (ref 5–15)
BUN: 42 mg/dL — ABNORMAL HIGH (ref 6–20)
CO2: 41 mmol/L — ABNORMAL HIGH (ref 22–32)
Calcium: 9.1 mg/dL (ref 8.9–10.3)
Chloride: 88 mmol/L — ABNORMAL LOW (ref 98–111)
Creatinine, Ser: 1.1 mg/dL — ABNORMAL HIGH (ref 0.44–1.00)
GFR, Estimated: 60 mL/min — ABNORMAL LOW (ref >60)
Glucose, Bld: 167 mg/dL — ABNORMAL HIGH (ref 70–99)
Potassium: 4.7 mmol/L (ref 3.5–5.1)
Sodium: 141 mmol/L (ref 135–145)

## 2020-07-26 LAB — CULTURE, BLOOD (ROUTINE X 2)
Culture: NO GROWTH
Culture: NO GROWTH
Special Requests: ADEQUATE

## 2020-07-26 LAB — CBC
HCT: 39.7 % (ref 36.0–46.0)
Hemoglobin: 12.3 g/dL (ref 12.0–15.0)
MCH: 31.5 pg (ref 26.0–34.0)
MCHC: 31 g/dL (ref 30.0–36.0)
MCV: 101.8 fL — ABNORMAL HIGH (ref 80.0–100.0)
Platelets: 226 10*3/uL (ref 150–400)
RBC: 3.9 MIL/uL (ref 3.87–5.11)
RDW: 17.1 % — ABNORMAL HIGH (ref 11.5–15.5)
WBC: 8 10*3/uL (ref 4.0–10.5)
nRBC: 0 % (ref 0.0–0.2)

## 2020-07-26 LAB — GLUCOSE, CAPILLARY
Glucose-Capillary: 123 mg/dL — ABNORMAL HIGH (ref 70–99)
Glucose-Capillary: 254 mg/dL — ABNORMAL HIGH (ref 70–99)
Glucose-Capillary: 232 mg/dL — ABNORMAL HIGH (ref 70–99)
Glucose-Capillary: 113 mg/dL — ABNORMAL HIGH (ref 70–99)

## 2020-07-26 LAB — PROTIME-INR
INR: 2.5 — ABNORMAL HIGH (ref 0.8–1.2)
Prothrombin Time: 25.9 seconds — ABNORMAL HIGH (ref 11.4–15.2)

## 2020-07-26 LAB — MAGNESIUM: Magnesium: 2.2 mg/dL (ref 1.7–2.4)

## 2020-07-26 MED ORDER — WARFARIN SODIUM 1 MG PO TABS
1.0000 mg | ORAL_TABLET | Freq: Once | ORAL | Status: AC
  Administered 2020-07-26: 1 mg via ORAL
  Filled 2020-07-26: qty 1

## 2020-07-26 MED ORDER — METHYLPREDNISOLONE SODIUM SUCC 40 MG IJ SOLR
40.0000 mg | Freq: Two times a day (BID) | INTRAMUSCULAR | Status: DC
  Administered 2020-07-26 – 2020-07-29 (×6): 40 mg via INTRAVENOUS
  Filled 2020-07-26 (×6): qty 1

## 2020-07-26 MED ORDER — IPRATROPIUM-ALBUTEROL 0.5-2.5 (3) MG/3ML IN SOLN
3.0000 mL | Freq: Three times a day (TID) | RESPIRATORY_TRACT | Status: DC
  Administered 2020-07-26 – 2020-07-29 (×10): 3 mL via RESPIRATORY_TRACT
  Filled 2020-07-26 (×10): qty 3

## 2020-07-26 NOTE — Progress Notes (Signed)
ANTICOAGULATION CONSULT NOTE   Pharmacy Consult for Warfarin Indication: History of VTE   Patient Measurements: Height: 4\' 10"  (147.3 cm) Weight: 96.9 kg (213 lb 10 oz) IBW/kg (Calculated) : 40.9  Vital Signs: Temp: 98.1 F (36.7 C) (11/20 1135) Temp Source: Oral (11/20 1135) BP: 112/89 (11/20 1000) Pulse Rate: 59 (11/20 1200)  Labs: Recent Labs    07/24/20 0601 07/24/20 0601 07/25/20 0401 07/26/20 0456 07/26/20 1019  HGB 10.6*   < > 11.7* 12.3  --   HCT 35.3*  --  38.1 39.7  --   PLT 256  --  272 226  --   LABPROT 26.5*  --  27.5*  --  25.9*  INR 2.5*  --  2.7*  --  2.5*  CREATININE 1.27*  --  1.12* 1.10*  --    < > = values in this interval not displayed.    Estimated Creatinine Clearance: 58.4 mL/min (A) (by C-G formula based on SCr of 1.1 mg/dL (H)).  Assessment: 54 yr old female presented [facility] ED with SOB. Medical hx includes lupus anticoagulant with hx of VTE, for which she is on anticoagulation with warfarin PTA (5 mg MWF, 2.5 mg other days of the week; per med rec, last dose was on the evening of 07/20/20). Pt also has recent dx of atrial flutter.   Major Drug Interaction: Target a 25- 50% reduction in weekly warfarin dose after 2 weeks of dual therapy with amiodarone and warfarin  INR 2.5- therapeutic    Goal of Therapy:  INR 2-3 Monitor platelets by anticoagulation protocol: Yes   Plan:  Warfarin 1 mg x 1 dose Consider weekly dose reduction of warfarin d/t major interaction w/ amiodarone Daily INR and CBC Monitor for signs/symptoms of bleeding  Thomasenia Sales, PharmD, MBA, BCGP Clinical Pharmacist  07/26/2020 2:19 PM

## 2020-07-26 NOTE — Progress Notes (Signed)
PROGRESS NOTE    Theresa [name]  GQQ:[id number] DOB: 04-13-1966 DOA: 07/21/2020 PCP: Monico Blitz, MD   Brief Narrative:  54 year old female with a history of combined CHF, COPD, chronic respiratory failure, tracheostomy dependent, lupus anticoagulant with history of VTE on chronic anticoagulation, brought to the hospital with shortness of breath. Admitted for increased weight gain secondary to CHF exacerbation as well as an element of COPD exacerbation.   Assessment & Plan:   Active Problems:   Essential hypertension   COPD exacerbation (HCC)   Lupus anticoagulant syndrome ([place])   Tracheostomy in place [facility]), chronic since 2002   Uncontrolled type 2 diabetes mellitus with hyperglycemia, with long-term current use of insulin (HCC)   Acute on chronic combined systolic and diastolic HF (heart failure) (HCC)   Acute on chronic respiratory failure (HCC)   Acute on chronic respiratory failure with hypoxia-slowly improving with diuresis -Chronically on 4 L of oxygen via nasal cannulaat home -Currently requiring  5 L via trach collar -Continue to wean down oxygen as tolerated  Acute on chronic combined CHF-ongoing -Most recent echocardiogram from 07/02/2020 shows ejection fraction 50% -Sister states that patient is "careless" and does not adequately take medications or weigh herself, or watch diet, refuses SNF placement and will need home health RN and aide to assist -Was recently started on2 mgtwice daily Bumexon recent admission -IV Lasix changed to IV Bumex 3 mg twice daily based on home dosages as she continues to have ongoing volume overload, but has had -1 L output over the last 24 hours, but question accuracy as she had urinated over the bed -Questionable accuracy of weights, currently at 213 pounds with baseline near 200 -She still has evidence of volume overload -Continue to monitor -Plan to place Foley catheter on 11/20 to assist with urine collection and monitoring as  well as prevention of urinating over her bed.  COPD exacerbation -Shecontinues tohave wheezing and coughwith tenacious tracheostomy secretions -Continue on bronchodilators -Antibiotics withdoxycycline -Continue IV steroids until further tapered -Continue pulmonary hygiene  Type 2 diabetes -With steroid induced hyperglycemia -Continue on basal insulin, increased dose to40units daily -Added NovoLog 3 times daily 5 units, may increase with further hyperglycemia noted to 9 units as recommended by coordinator -Continue sliding scale insulin  Lupus anticoagulant syndrome with history of VTE -Continue anticoagulation with warfarin  Recent diagnosis of atrial flutter -Continue on amiodarone -She is anticoagulated -She is also on digoxin with supratherapeutic levels. -Digoxin level now 1.7, but continue to hold given some bradycardia-possibly restart by a.m.   DVT prophylaxis:Coumadin Code Status:Full code Family Communication:Discussed with husband at bedside 11/19 Disposition Plan:Status is: Inpatient  Remains inpatient appropriate because:Inpatient level of care appropriate due to severity of illness   Dispo: The patient is from:Home Anticipated d/c is IZ:[id number] HH Anticipated d/c date is: 2-3days Patient currently is not medically stable to d/c.She continues to have significant hypoxemia and volume overload requiring aggressive diuresis.  Antimicrobials:   None   Subjective: Patient seen and evaluated today with frustration over the fact that she had to lay in her urine last two nights.  She is agreeable to Foley catheter placement.  Objective: Vitals:   07/26/20 0800 07/26/20 0900 07/26/20 0934 07/26/20 1000  BP: (!) 156/76   112/89  Pulse: (!) 54 65  60  Resp: 15 (!) 25    Temp: (!) 97.1 F (36.2 C)     TempSrc: Oral     SpO2: (!) 89% 93% 95% (!) 82%  Weight:  Height:        Intake/Output  Summary (Last 24 hours) at 07/26/2020 1105 Last data filed at 07/26/2020 1000 Gross per 24 hour  Intake 530 ml  Output 1525 ml  Net -995 ml   Filed Weights   07/25/20 0500 07/25/20 0805 07/26/20 0500  Weight: 98.7 kg 98 kg 96.9 kg    Examination:  General exam: Appears calm and comfortable  Respiratory system: Clear to auscultation. Respiratory effort normal.  Trach collar with ongoing 5 L supplementation and nasal cannula supplementation 4 L Cardiovascular system: S1 & S2 heard, RRR.  Gastrointestinal system: Abdomen is nondistended, soft and nontender.  Central nervous system: Alert and awake Extremities: Scant bilateral edema Skin: No rashes, lesions or ulcers Psychiatry: Flat affect    Data Reviewed: I have personally reviewed following labs and imaging studies  CBC: Recent Labs  Lab 07/21/20 1152 07/23/20 0411 07/24/20 0601 07/25/20 0401 07/26/20 0456  WBC 6.8 6.8 7.3 7.6 8.0  NEUTROABS 5.4  --   --   --   --   HGB 11.8* 10.5* 10.6* 11.7* 12.3  HCT 38.5 35.1* 35.3* 38.1 39.7  MCV 102.7* 104.2* 104.1* 103.5* 101.8*  PLT 244 246 256 272 220   Basic Metabolic Panel: Recent Labs  Lab 07/22/20 0446 07/23/20 0411 07/24/20 0601 07/25/20 0401 07/26/20 0456  NA 138 137 137 140 141  K 4.8 4.8 4.3 4.5 4.7  CL 92* 90* 89* 88* 88*  CO2 37* 37* 38* 41* 41*  GLUCOSE 300* 257* 277* 150* 167*  BUN 21* 30* 37* 40* 42*  CREATININE 1.22* 1.38* 1.27* 1.12* 1.10*  CALCIUM 8.7* 8.7* 8.5* 8.8* 9.1  MG  --   --  1.9 2.0 2.2   GFR: Estimated Creatinine Clearance: 58.4 mL/min (A) (by C-G formula based on SCr of 1.1 mg/dL (H)). Liver Function Tests: No results for input(s): AST, ALT, ALKPHOS, BILITOT, PROT, ALBUMIN in the last 168 hours. No results for input(s): LIPASE, AMYLASE in the last 168 hours. No results for input(s): AMMONIA in the last 168 hours. Coagulation Profile: Recent Labs  Lab 07/21/20 2121 07/22/20 0446 07/23/20 0411 07/24/20 0601 07/25/20 0401  INR  3.8* 3.6* 3.6* 2.5* 2.7*   Cardiac Enzymes: No results for input(s): CKTOTAL, CKMB, CKMBINDEX, TROPONINI in the last 168 hours. BNP (last 3 results) No results for input(s): PROBNP in the last 8760 hours. HbA1C: No results for input(s): HGBA1C in the last 72 hours. CBG: Recent Labs  Lab 07/25/20 0803 07/25/20 1201 07/25/20 1702 07/25/20 2032 07/26/20 0806  GLUCAP 125* 282* 199* 116* 123*   Lipid Profile: No results for input(s): CHOL, HDL, LDLCALC, TRIG, CHOLHDL, LDLDIRECT in the last 72 hours. Thyroid Function Tests: No results for input(s): TSH, T4TOTAL, FREET4, T3FREE, THYROIDAB in the last 72 hours. Anemia Panel: No results for input(s): VITAMINB12, FOLATE, FERRITIN, TIBC, IRON, RETICCTPCT in the last 72 hours. Sepsis Labs: Recent Labs  Lab 07/21/20 1153 07/21/20 1347  LATICACIDVEN 2.9* 1.7    Recent Results (from the past 240 hour(s))  Respiratory Panel by RT PCR (Flu A&B, Covid) - Nasopharyngeal Swab     Status: None   Collection Time: 07/21/20 11:19 AM   Specimen: Nasopharyngeal Swab  Result Value Ref Range Status   SARS Coronavirus 2 by RT PCR NEGATIVE NEGATIVE Final    Comment: (NOTE) SARS-CoV-2 target nucleic acids are NOT DETECTED.  The SARS-CoV-2 RNA is generally detectable in upper respiratoy specimens during the acute phase of infection. The lowest concentration of SARS-CoV-2 viral  copies this assay can detect is 131 copies/mL. A negative result does not preclude SARS-Cov-2 infection and should not be used as the sole basis for treatment or other patient management decisions. A negative result may occur with  improper specimen collection/handling, submission of specimen other than nasopharyngeal swab, presence of viral mutation(s) within the areas targeted by this assay, and inadequate number of viral copies (<131 copies/mL). A negative result must be combined with clinical observations, patient history, and epidemiological information. The expected  result is Negative.  Fact Sheet for Patients:  PinkCheek.be  Fact Sheet for Healthcare Providers:  GravelBags.it  This test is no t yet approved or cleared by the [place] FDA and  has been authorized for detection and/or diagnosis of SARS-CoV-2 by FDA under an Emergency Use Authorization (EUA). This EUA will remain  in effect (meaning this test can be used) for the duration of the COVID-19 declaration under Section 564(b)(1) of the Act, 21 U.S.C. section 360bbb-3(b)(1), unless the authorization is terminated or revoked sooner.     Influenza A by PCR NEGATIVE NEGATIVE Final   Influenza B by PCR NEGATIVE NEGATIVE Final    Comment: (NOTE) The Xpert Xpress SARS-CoV-2/FLU/RSV assay is intended as an aid in  the diagnosis of influenza from Nasopharyngeal swab specimens and  should not be used as a sole basis for treatment. Nasal washings and  aspirates are unacceptable for Xpert Xpress SARS-CoV-2/FLU/RSV  testing.  Fact Sheet for Patients: PinkCheek.be  Fact Sheet for Healthcare Providers: GravelBags.it  This test is not yet approved or cleared by the [place] FDA and  has been authorized for detection and/or diagnosis of SARS-CoV-2 by  FDA under an Emergency Use Authorization (EUA). This EUA will remain  in effect (meaning this test can be used) for the duration of the  Covid-19 declaration under Section 564(b)(1) of the Act, 21  U.S.C. section 360bbb-3(b)(1), unless the authorization is  terminated or revoked. Performed at [facility], [address], [place], [place] [address]   Culture, blood (routine x 2)     Status: None   Collection Time: 07/21/20 11:52 AM   Specimen: BLOOD  Result Value Ref Range Status   Specimen Description BLOOD SITE NOT SPECIFIED DRAWN BY RN  Final   Special Requests   Final    BOTTLES DRAWN AEROBIC AND ANAEROBIC Blood  Culture adequate volume   Culture   Final    NO GROWTH 5 DAYS Performed at [facility], [address], [place], [place] [address]    Report Status 07/26/2020 FINAL  Final  Culture, blood (routine x 2)     Status: None   Collection Time: 07/21/20 12:41 PM   Specimen: BLOOD  Result Value Ref Range Status   Specimen Description BLOOD SITE NOT SPECIFIED  Final   Special Requests   Final    BOTTLES DRAWN AEROBIC AND ANAEROBIC Blood Culture results may not be optimal due to an inadequate volume of blood received in culture bottles   Culture   Final    NO GROWTH 5 DAYS Performed at [facility], [address], [place], [place] [address]    Report Status 07/26/2020 FINAL  Final  Culture, respiratory (non-expectorated)     Status: None   Collection Time: 07/21/20  3:11 PM   Specimen: Tracheal Aspirate; Respiratory  Result Value Ref Range Status   Specimen Description   Final    TRACHEAL ASPIRATE Performed at [facility] [address] [place], [place] [address]  Special Requests   Final    NONE Performed at [facility], [address] [place], [place] [address]    Gram Stain   Final    RARE WBC PRESENT,BOTH PMN AND MONONUCLEAR RARE GRAM POSITIVE RODS RARE GRAM NEGATIVE RODS Performed at [facility], [place] [address], [place], [place] [address]    Culture RARE PSEUDOMONAS AERUGINOSA  Final   Report Status 07/24/2020 FINAL  Final   Organism ID, Bacteria PSEUDOMONAS AERUGINOSA  Final      Susceptibility   Pseudomonas aeruginosa - MIC*    CEFTAZIDIME 4 SENSITIVE Sensitive     CIPROFLOXACIN <=0.25 SENSITIVE Sensitive     GENTAMICIN <=1 SENSITIVE Sensitive     IMIPENEM 1 SENSITIVE Sensitive     PIP/TAZO 8 SENSITIVE Sensitive     CEFEPIME 2 SENSITIVE Sensitive     * RARE PSEUDOMONAS AERUGINOSA         Radiology Studies: No results found.      Scheduled Meds:  amiodarone  200 mg Oral Daily   budesonide (PULMICORT) nebulizer solution  1 mg Nebulization BID     Chlorhexidine Gluconate Cloth  6 each Topical Daily   gabapentin  900 mg Oral QHS   insulin aspart  0-20 Units Subcutaneous TID WC   insulin aspart  0-5 Units Subcutaneous QHS   insulin aspart  5 Units Subcutaneous TID WC   insulin glargine  40 Units Subcutaneous QHS   ipratropium-albuterol  3 mL Nebulization TID   methylPREDNISolone (SOLU-MEDROL) injection  40 mg Intravenous Q12H   pantoprazole  40 mg Oral Daily   potassium chloride SA  40 mEq Oral Daily   sertraline  50 mg Oral QHS   tamsulosin  0.4 mg Oral Daily   Warfarin - Pharmacist Dosing Inpatient   Does not apply q1600   Continuous Infusions:  bumetanide (BUMEX) IV 3 mg (07/26/20 0950)     LOS: 5 days    Time spent: 30 minutes    Cozetta Seif Darleen Crocker, DO [facility]  If 7PM-7AM, please contact night-coverage [email or web address] 07/26/2020, 11:05 AM

## 2020-07-27 DIAGNOSIS — I5043 Acute on chronic combined systolic (congestive) and diastolic (congestive) heart failure: Secondary | ICD-10-CM | POA: Diagnosis not present

## 2020-07-27 LAB — CBC
HCT: 41.7 % (ref 36.0–46.0)
Hemoglobin: 13 g/dL (ref 12.0–15.0)
MCH: 31 pg (ref 26.0–34.0)
MCHC: 31.2 g/dL (ref 30.0–36.0)
MCV: 99.5 fL (ref 80.0–100.0)
Platelets: 285 10*3/uL (ref 150–400)
RBC: 4.19 MIL/uL (ref 3.87–5.11)
RDW: 16.6 % — ABNORMAL HIGH (ref 11.5–15.5)
WBC: 11.8 10*3/uL — ABNORMAL HIGH (ref 4.0–10.5)
nRBC: 0 % (ref 0.0–0.2)

## 2020-07-27 LAB — BASIC METABOLIC PANEL
Anion gap: 10 (ref 5–15)
BUN: 42 mg/dL — ABNORMAL HIGH (ref 6–20)
CO2: 44 mmol/L — ABNORMAL HIGH (ref 22–32)
Calcium: 9.2 mg/dL (ref 8.9–10.3)
Chloride: 85 mmol/L — ABNORMAL LOW (ref 98–111)
Creatinine, Ser: 1.14 mg/dL — ABNORMAL HIGH (ref 0.44–1.00)
GFR, Estimated: 57 mL/min — ABNORMAL LOW (ref >60)
Glucose, Bld: 59 mg/dL — ABNORMAL LOW (ref 70–99)
Potassium: 3.9 mmol/L (ref 3.5–5.1)
Sodium: 139 mmol/L (ref 135–145)

## 2020-07-27 LAB — PROTIME-INR
INR: 2.4 — ABNORMAL HIGH (ref 0.8–1.2)
Prothrombin Time: 25.1 seconds — ABNORMAL HIGH (ref 11.4–15.2)

## 2020-07-27 LAB — MAGNESIUM: Magnesium: 2.3 mg/dL (ref 1.7–2.4)

## 2020-07-27 LAB — GLUCOSE, CAPILLARY
Glucose-Capillary: 66 mg/dL — ABNORMAL LOW (ref 70–99)
Glucose-Capillary: 268 mg/dL — ABNORMAL HIGH (ref 70–99)
Glucose-Capillary: 150 mg/dL — ABNORMAL HIGH (ref 70–99)
Glucose-Capillary: 365 mg/dL — ABNORMAL HIGH (ref 70–99)

## 2020-07-27 MED ORDER — INSULIN GLARGINE 100 UNIT/ML ~~LOC~~ SOLN
35.0000 [IU] | Freq: Every day | SUBCUTANEOUS | Status: DC
  Administered 2020-07-27 – 2020-07-28 (×2): 35 [IU] via SUBCUTANEOUS
  Filled 2020-07-27 (×3): qty 0.35

## 2020-07-27 MED ORDER — WARFARIN SODIUM 1 MG PO TABS
1.0000 mg | ORAL_TABLET | Freq: Once | ORAL | Status: AC
  Administered 2020-07-27: 1 mg via ORAL
  Filled 2020-07-27: qty 1

## 2020-07-27 NOTE — Progress Notes (Signed)
MD notified of CBG 365. Pt has regular Dr Theresa [name] and snack cakes at bedside. Pt educated on heart healthy carb modified diet. Pt stated the CBG was only high because of the steroids she is receiving.

## 2020-07-27 NOTE — Progress Notes (Signed)
PROGRESS NOTE    Theresa [name]  EZM:[id number] DOB: 18-Aug-1966 DOA: 07/21/2020 PCP: Monico Blitz, MD   Brief Narrative:  54 year old female with a history of combined CHF, COPD, chronic respiratory failure, tracheostomy dependent, lupus anticoagulant with history of VTE on chronic anticoagulation, brought to the hospital with shortness of breath. Admitted for increased weight gain secondary to CHF exacerbation as well as an element of COPD exacerbation.   Assessment & Plan:   Active Problems:   Essential hypertension   COPD exacerbation (HCC)   Lupus anticoagulant syndrome ([place])   Tracheostomy in place [facility]), chronic since 2002   Uncontrolled type 2 diabetes mellitus with hyperglycemia, with long-term current use of insulin (HCC)   Acute on chronic combined systolic and diastolic HF (heart failure) (HCC)   Acute on chronic respiratory failure (HCC)   Acute on chronic respiratory failure with hypoxia-slowly improving with diuresis -Chronically on 4 L of oxygen via nasal cannulaat home -Currently requiring5L via trach collar -Continue to wean down oxygen as tolerated  Acute on chronic combined CHF-ongoing -Most recent echocardiogram from 07/02/2020 shows ejection fraction 50% -Sister states that patient is "careless" and does not adequately take medications or weigh herself, or watch diet, refuses SNF placement and will need home health RN and aide to assist -Was recently started on2 mgtwice daily [place] recent admission -IV Lasix changed to IV Bumex 3 mg twice daily based on home dosages as she continues to have ongoing volume overload, but has had -2 L output over the last 24 hours -Questionable accuracy of weights, currently at 212 pounds with baseline near 200 -She still has evidence of volume overload -Continue to monitor -Placed Foley catheter on 11/20 to assist with urine collection and monitoring as well as prevention of urinating over her bed.  COPD  exacerbation -Shecontinues tohave wheezing and coughwith tenacious tracheostomy secretions -Continue on bronchodilators -Antibiotics withdoxycycline -Continue IV steroidsuntil further tapered -Continue pulmonary hygiene  Type 2 diabetes -With steroid induced hyperglycemia -Continue on basal insulin, decreased to 35 units due to hypoglycemia -Continue sliding scale insulin  Lupus anticoagulant syndrome with history of VTE -Continue anticoagulation with warfarin  Recent diagnosis of atrial flutter -Continue on amiodarone -She is anticoagulated -She is also on digoxin with supratherapeutic levels. -Digoxin level now 1.7, but continue to hold given some bradycardia-possibly restart by a.m.   DVT prophylaxis:Coumadin Code Status:Full code Family Communication:Discussed withhusband at bedside 11/19 Disposition Plan:Status is: Inpatient  Remains inpatient appropriate because:Inpatient level of care appropriate due to severity of illness   Dispo: The patient is from:Home Anticipated d/c is TK:[id number] HH Anticipated d/c date is: 2-3days Patient currently is not medically stable to d/c.She continues to have significant hypoxemia and volume overload requiring aggressive diuresis.  Antimicrobials:   None   Subjective: Patient seen and evaluated today with no new acute complaints or concerns. No acute concerns or events noted overnight.  Objective: Vitals:   07/27/20 0400 07/27/20 0500 07/27/20 0600 07/27/20 0722  BP: (!) 147/69 (!) 151/75 129/62   Pulse: 64 (!) 42 60 (!) 58  Resp: (!) 26 20 17 15   Temp: 98.5 F (36.9 C)   98.2 F (36.8 C)  TempSrc:    Oral  SpO2: 93% 100% 93% 94%  Weight:  96.5 kg    Height:        Intake/Output Summary (Last 24 hours) at 07/27/2020 0814 Last data filed at 07/27/2020 0600 Gross per 24 hour  Intake 1504.88 ml  Output 3545 ml  Net -2040.12 ml  Filed Weights    07/25/20 0805 07/26/20 0500 07/27/20 0500  Weight: 98 kg 96.9 kg 96.5 kg    Examination:  General exam: Appears calm and comfortable  Respiratory system: Clear to auscultation. Respiratory effort normal. On 4L [place] and 5L trach collar. Cardiovascular system: S1 & S2 heard, RRR.  Gastrointestinal system: Abdomen is nondistended, soft and nontender.  Central nervous system: Alert and oriented. No focal neurological deficits. Extremities: No significant edema Skin: No rashes, lesions or ulcers Psychiatry: Judgement and insight appear normal. Mood & affect appropriate.     Data Reviewed: I have personally reviewed following labs and imaging studies  CBC: Recent Labs  Lab 07/21/20 1152 07/21/20 1152 07/23/20 0411 07/24/20 0601 07/25/20 0401 07/26/20 0456 07/27/20 0451  WBC 6.8   < > 6.8 7.3 7.6 8.0 11.8*  NEUTROABS 5.4  --   --   --   --   --   --   HGB 11.8*   < > 10.5* 10.6* 11.7* 12.3 13.0  HCT 38.5   < > 35.1* 35.3* 38.1 39.7 41.7  MCV 102.7*   < > 104.2* 104.1* 103.5* 101.8* 99.5  PLT 244   < > 246 256 272 226 285   < > = values in this interval not displayed.   Basic Metabolic Panel: Recent Labs  Lab 07/23/20 0411 07/24/20 0601 07/25/20 0401 07/26/20 0456 07/27/20 0451  NA 137 137 140 141 139  K 4.8 4.3 4.5 4.7 3.9  CL 90* 89* 88* 88* 85*  CO2 37* 38* 41* 41* 44*  GLUCOSE 257* 277* 150* 167* 59*  BUN 30* 37* 40* 42* 42*  CREATININE 1.38* 1.27* 1.12* 1.10* 1.14*  CALCIUM 8.7* 8.5* 8.8* 9.1 9.2  MG  --  1.9 2.0 2.2 2.3   GFR: Estimated Creatinine Clearance: 56.2 mL/min (A) (by C-G formula based on SCr of 1.14 mg/dL (H)). Liver Function Tests: No results for input(s): AST, ALT, ALKPHOS, BILITOT, PROT, ALBUMIN in the last 168 hours. No results for input(s): LIPASE, AMYLASE in the last 168 hours. No results for input(s): AMMONIA in the last 168 hours. Coagulation Profile: Recent Labs  Lab 07/23/20 0411 07/24/20 0601 07/25/20 0401 07/26/20 1019  07/27/20 0451  INR 3.6* 2.5* 2.7* 2.5* 2.4*   Cardiac Enzymes: No results for input(s): CKTOTAL, CKMB, CKMBINDEX, TROPONINI in the last 168 hours. BNP (last 3 results) No results for input(s): PROBNP in the last 8760 hours. HbA1C: No results for input(s): HGBA1C in the last 72 hours. CBG: Recent Labs  Lab 07/26/20 0806 07/26/20 1134 07/26/20 1616 07/26/20 2011 07/27/20 0714  GLUCAP 123* 254* 232* 113* 66*   Lipid Profile: No results for input(s): CHOL, HDL, LDLCALC, TRIG, CHOLHDL, LDLDIRECT in the last 72 hours. Thyroid Function Tests: No results for input(s): TSH, T4TOTAL, FREET4, T3FREE, THYROIDAB in the last 72 hours. Anemia Panel: No results for input(s): VITAMINB12, FOLATE, FERRITIN, TIBC, IRON, RETICCTPCT in the last 72 hours. Sepsis Labs: Recent Labs  Lab 07/21/20 1153 07/21/20 1347  LATICACIDVEN 2.9* 1.7    Recent Results (from the past 240 hour(s))  Respiratory Panel by RT PCR (Flu A&B, Covid) - Nasopharyngeal Swab     Status: None   Collection Time: 07/21/20 11:19 AM   Specimen: Nasopharyngeal Swab  Result Value Ref Range Status   SARS Coronavirus 2 by RT PCR NEGATIVE NEGATIVE Final    Comment: (NOTE) SARS-CoV-2 target nucleic acids are NOT DETECTED.  The SARS-CoV-2 RNA is generally detectable in upper respiratoy specimens during the acute  phase of infection. The lowest concentration of SARS-CoV-2 viral copies this assay can detect is 131 copies/mL. A negative result does not preclude SARS-Cov-2 infection and should not be used as the sole basis for treatment or other patient management decisions. A negative result may occur with  improper specimen collection/handling, submission of specimen other than nasopharyngeal swab, presence of viral mutation(s) within the areas targeted by this assay, and inadequate number of viral copies (<131 copies/mL). A negative result must be combined with clinical observations, patient history, and epidemiological  information. The expected result is Negative.  Fact Sheet for Patients:  PinkCheek.be  Fact Sheet for Healthcare Providers:  GravelBags.it  This test is no t yet approved or cleared by the [place] FDA and  has been authorized for detection and/or diagnosis of SARS-CoV-2 by FDA under an Emergency Use Authorization (EUA). This EUA will remain  in effect (meaning this test can be used) for the duration of the COVID-19 declaration under Section 564(b)(1) of the Act, 21 U.S.C. section 360bbb-3(b)(1), unless the authorization is terminated or revoked sooner.     Influenza A by PCR NEGATIVE NEGATIVE Final   Influenza B by PCR NEGATIVE NEGATIVE Final    Comment: (NOTE) The Xpert Xpress SARS-CoV-2/FLU/RSV assay is intended as an aid in  the diagnosis of influenza from Nasopharyngeal swab specimens and  should not be used as a sole basis for treatment. Nasal washings and  aspirates are unacceptable for Xpert Xpress SARS-CoV-2/FLU/RSV  testing.  Fact Sheet for Patients: PinkCheek.be  Fact Sheet for Healthcare Providers: GravelBags.it  This test is not yet approved or cleared by the [place] FDA and  has been authorized for detection and/or diagnosis of SARS-CoV-2 by  FDA under an Emergency Use Authorization (EUA). This EUA will remain  in effect (meaning this test can be used) for the duration of the  Covid-19 declaration under Section 564(b)(1) of the Act, 21  U.S.C. section 360bbb-3(b)(1), unless the authorization is  terminated or revoked. Performed at [facility], [address], [place], [place] [address]   Culture, blood (routine x 2)     Status: None   Collection Time: 07/21/20 11:52 AM   Specimen: BLOOD  Result Value Ref Range Status   Specimen Description BLOOD SITE NOT SPECIFIED DRAWN BY RN  Final   Special Requests   Final    BOTTLES DRAWN  AEROBIC AND ANAEROBIC Blood Culture adequate volume   Culture   Final    NO GROWTH 5 DAYS Performed at [facility], [address], [place],  [address]    Report Status 07/26/2020 FINAL  Final  Culture, blood (routine x 2)     Status: None   Collection Time: 07/21/20 12:41 PM   Specimen: BLOOD  Result Value Ref Range Status   Specimen Description BLOOD SITE NOT SPECIFIED  Final   Special Requests   Final    BOTTLES DRAWN AEROBIC AND ANAEROBIC Blood Culture results may not be optimal due to an inadequate volume of blood received in culture bottles   Culture   Final    NO GROWTH 5 DAYS Performed at [facility], [address], [place],  [address]    Report Status 07/26/2020 FINAL  Final  Culture, respiratory (non-expectorated)     Status: None   Collection Time: 07/21/20  3:11 PM   Specimen: Tracheal Aspirate; Respiratory  Result Value Ref Range Status   Specimen Description   Final    TRACHEAL ASPIRATE Performed at [facility],  [address], [place], [place] [address]    Special Requests   Final    NONE Performed at [facility], [address], [place], [place] [address]    Gram Stain   Final    RARE WBC PRESENT,BOTH PMN AND MONONUCLEAR RARE GRAM POSITIVE RODS RARE GRAM NEGATIVE RODS Performed at [facility], [place] [address] [place], [place] [address]    Culture RARE PSEUDOMONAS AERUGINOSA  Final   Report Status 07/24/2020 FINAL  Final   Organism ID, Bacteria PSEUDOMONAS AERUGINOSA  Final      Susceptibility   Pseudomonas aeruginosa - MIC*    CEFTAZIDIME 4 SENSITIVE Sensitive     CIPROFLOXACIN <=0.25 SENSITIVE Sensitive     GENTAMICIN <=1 SENSITIVE Sensitive     IMIPENEM 1 SENSITIVE Sensitive     PIP/TAZO 8 SENSITIVE Sensitive     CEFEPIME 2 SENSITIVE Sensitive     * RARE PSEUDOMONAS AERUGINOSA         Radiology Studies: No results found.      Scheduled Meds: . amiodarone  200 mg Oral Daily  . budesonide (PULMICORT) nebulizer  solution  1 mg Nebulization BID  . Chlorhexidine Gluconate Cloth  6 each Topical Daily  . gabapentin  900 mg Oral QHS  . insulin aspart  0-20 Units Subcutaneous TID WC  . insulin aspart  0-5 Units Subcutaneous QHS  . insulin glargine  35 Units Subcutaneous QHS  . ipratropium-albuterol  3 mL Nebulization TID  . methylPREDNISolone (SOLU-MEDROL) injection  40 mg Intravenous Q12H  . pantoprazole  40 mg Oral Daily  . potassium chloride SA  40 mEq Oral Daily  . sertraline  50 mg Oral QHS  . tamsulosin  0.4 mg Oral Daily  . Warfarin - Pharmacist Dosing Inpatient   Does not apply q1600   Continuous Infusions: . bumetanide (BUMEX) IV 3 mg (07/26/20 2145)     LOS: 6 days    Time spent: 30 minutes    Maytal Mijangos Darleen Crocker, DO [facility]  If 7PM-7AM, please contact night-coverage [email or web address] 07/27/2020, 8:14 AM

## 2020-07-27 NOTE — Progress Notes (Signed)
ANTICOAGULATION CONSULT NOTE   Pharmacy Consult for Warfarin Indication: History of VTE   Patient Measurements: Height: 4\' 10"  (147.3 cm) Weight: 96.5 kg (212 lb 11.9 oz) IBW/kg (Calculated) : 40.9  Vital Signs: Temp: 98.2 F (36.8 C) (11/21 0722) Temp Source: Oral (11/21 0722) BP: 129/62 (11/21 0600) Pulse Rate: 58 (11/21 0722)  Labs: Recent Labs    07/25/20 0401 07/25/20 0401 07/26/20 0456 07/26/20 1019 07/27/20 0451  HGB 11.7*   < > 12.3  --  13.0  HCT 38.1  --  39.7  --  41.7  PLT 272  --  226  --  285  LABPROT 27.5*  --   --  25.9* 25.1*  INR 2.7*  --   --  2.5* 2.4*  CREATININE 1.12*  --  1.10*  --  1.14*   < > = values in this interval not displayed.    Estimated Creatinine Clearance: 56.2 mL/min (A) (by C-G formula based on SCr of 1.14 mg/dL (H)).  Assessment: 54 yr old female presented to [facility] ED with SOB. Medical hx includes lupus anticoagulant with hx of VTE, for which she is on anticoagulation with warfarin PTA (5 mg MWF, 2.5 mg other days of the week; per med rec, last dose was on the evening of 07/20/20). Pt also has recent dx of atrial flutter.   Major Drug Interaction: Target a 25- 50% reduction in weekly warfarin dose after 2 weeks of dual therapy with amiodarone and warfarin  INR 2.4- therapeutic    Goal of Therapy:  INR 2-3 Monitor platelets by anticoagulation protocol: Yes   Plan:  Warfarin 1 mg x 1 dose Consider weekly dose reduction of warfarin d/t major interaction w/ amiodarone Daily INR and CBC Monitor for signs/symptoms of bleeding  Thomasenia Sales, PharmD, MBA, BCGP Clinical Pharmacist  07/27/2020 9:16 AM

## 2020-07-28 ENCOUNTER — Inpatient Hospital Stay (HOSPITAL_COMMUNITY): Payer: Medicare Other

## 2020-07-28 DIAGNOSIS — I5043 Acute on chronic combined systolic (congestive) and diastolic (congestive) heart failure: Secondary | ICD-10-CM | POA: Diagnosis not present

## 2020-07-28 LAB — BASIC METABOLIC PANEL
Anion gap: 11 (ref 5–15)
BUN: 47 mg/dL — ABNORMAL HIGH (ref 6–20)
CO2: 42 mmol/L — ABNORMAL HIGH (ref 22–32)
Calcium: 8.9 mg/dL (ref 8.9–10.3)
Chloride: 86 mmol/L — ABNORMAL LOW (ref 98–111)
Creatinine, Ser: 1.15 mg/dL — ABNORMAL HIGH (ref 0.44–1.00)
GFR, Estimated: 57 mL/min — ABNORMAL LOW (ref >60)
Glucose, Bld: 93 mg/dL (ref 70–99)
Potassium: 4.1 mmol/L (ref 3.5–5.1)
Sodium: 139 mmol/L (ref 135–145)

## 2020-07-28 LAB — PROTIME-INR
INR: 1.9 — ABNORMAL HIGH (ref 0.8–1.2)
Prothrombin Time: 21.2 seconds — ABNORMAL HIGH (ref 11.4–15.2)

## 2020-07-28 LAB — GLUCOSE, CAPILLARY
Glucose-Capillary: 101 mg/dL — ABNORMAL HIGH (ref 70–99)
Glucose-Capillary: 326 mg/dL — ABNORMAL HIGH (ref 70–99)
Glucose-Capillary: 380 mg/dL — ABNORMAL HIGH (ref 70–99)
Glucose-Capillary: 202 mg/dL — ABNORMAL HIGH (ref 70–99)

## 2020-07-28 LAB — MAGNESIUM: Magnesium: 2.5 mg/dL — ABNORMAL HIGH (ref 1.7–2.4)

## 2020-07-28 MED ORDER — METOLAZONE 5 MG PO TABS
2.5000 mg | ORAL_TABLET | Freq: Once | ORAL | Status: AC
  Administered 2020-07-28: 2.5 mg via ORAL
  Filled 2020-07-28: qty 1

## 2020-07-28 MED ORDER — WARFARIN SODIUM 2.5 MG PO TABS
2.5000 mg | ORAL_TABLET | Freq: Once | ORAL | Status: AC
  Administered 2020-07-28: 2.5 mg via ORAL
  Filled 2020-07-28: qty 1

## 2020-07-28 MED ORDER — INSULIN ASPART 100 UNIT/ML ~~LOC~~ SOLN
5.0000 [IU] | Freq: Three times a day (TID) | SUBCUTANEOUS | Status: DC
  Administered 2020-07-28 – 2020-07-29 (×3): 5 [IU] via SUBCUTANEOUS

## 2020-07-28 NOTE — TOC Progression Note (Signed)
Transition of Care [facility]) - Progression Note    Patient Details  Name: Theresa [name] MRN: [id number] Date of Birth: 1966/08/06  Transition of Care [facility]) CM/SW Contact  Boneta Lucks, RN Phone Number: 07/28/2020, 12:28 PM  Clinical Narrative:   PT is recommending a rolling walker. Referral made to [facility] with [employer].     Expected Discharge Plan: [place] Barriers to Discharge: Continued Medical Work up  Expected Discharge Plan and Services Expected Discharge Plan: [place] arrangements for the past 2 months: Single Family Home                    Readmission Risk Interventions Readmission Risk Prevention Plan 07/22/2020 05/17/2019  Transportation Screening Complete Complete  PCP or Specialist Appt within 3-5 Days - Complete  HRI or [place] - Complete  Social Work Consult for [place] Planning/Counseling - Complete  Palliative Care Screening - Not Applicable  Medication Review Press photographer) Complete Complete  Palliative Care Screening Not Complete -  [place] Not Applicable -  Some recent data might be hidden

## 2020-07-28 NOTE — Progress Notes (Signed)
Inpatient Diabetes Program Recommendations  AACE/ADA: New Consensus Statement on Inpatient Glycemic Control (2015)  Target Ranges:  Prepandial:   less than 140 mg/dL      Peak postprandial:   less than 180 mg/dL (1-2 hours)      Critically ill patients:  140 - 180 mg/dL   Lab Results  Component Value Date   GLUCAP 101 (H) 07/28/2020   HGBA1C 8.9 (H) 06/16/2020    Review of Glycemic Control Results for Theresa [name], Theresa [name] (MRN [id number]) as of 07/28/2020 08:11  Ref. Range 07/27/2020 07:14 07/27/2020 11:25 07/27/2020 16:25 07/27/2020 20:01 07/28/2020 07:48  Glucose-Capillary Latest Ref Range: 70 - 99 mg/dL 66 (L) 268 (H) 150 (H) 365 (H) 101 (H)   Current orders for Inpatient glycemic control:  Lantus 35 units qhs Novolog 0-20 units tid & 0-5 qhs Solu-medrol 40 mg q12h  Inpatient Diabetes Program Recommendations:     Novolog 5 units tid with meals  Will continue to follow while inpatient.  Thank you, Reche Dixon, RN, BSN Diabetes Coordinator Inpatient Diabetes Program [phone] (team pager from 8a-5p)

## 2020-07-28 NOTE — Progress Notes (Addendum)
PROGRESS NOTE    Theresa [name]  PXT:[id number] DOB: [birth year] DOA: 07/21/2020 PCP: Monico Blitz, MD   Brief Narrative:  54 year old female with a history of combined CHF, COPD, chronic respiratory failure, tracheostomy dependent, lupus anticoagulant with history of VTE on chronic anticoagulation, brought to the hospital with shortness of breath. Admitted for increased weight gain secondary to CHF exacerbation as well as an element of COPD exacerbation.  Assessment & Plan:   Active Problems:   Essential hypertension   COPD exacerbation (HCC)   Lupus anticoagulant syndrome ([place])   Tracheostomy in place [facility]), chronic since 2002   Uncontrolled type 2 diabetes mellitus with hyperglycemia, with long-term current use of insulin (HCC)   Acute on chronic combined systolic and diastolic HF (heart failure) (HCC)   Acute on chronic respiratory failure (HCC)   Acute on chronic respiratory failure with hypoxia-slowly improving with diuresis -Chronically on 4 L of oxygen via nasal cannulaat home -Currently requiring8L via trach collar -Continue to wean down oxygen as tolerated -Repeat chest x-ray 11/22  Acute on chronic combined CHF-ongoing -Most recent echocardiogram from 07/02/2020 shows ejection fraction 50% -Sister states that patient is "careless" and does not adequately take medications or weigh herself, or watch diet, refuses SNF placement and will need home health RN and aide to assist -Was recently started on2 mgtwice daily Bumexon recent admission -IV Lasix changed to IV Bumex 3 mg twice daily based on home dosages as she continues to have ongoing volume overload, but has had -2L output over the last 24 hours -Questionable accuracy of weights, currently at 212 pounds with baseline near 200 -She still has evidence of volume overload -Trial of metolazone 2.5mg  once on 11/22 -Continue to monitor -Placed Foley catheter on 11/20 to assist with urine collection and monitoring  as well as prevention of urinating over her bed; will need to continue with metolazone today  COPD exacerbation -Shecontinues tohave wheezing and coughwith tenacious tracheostomy secretions -Continue on bronchodilators -Antibiotics withdoxycycline -Continue IV steroidsuntil further tapered -Continue pulmonary hygiene  Type 2 diabetes -With steroid induced hyperglycemia -Continue on basal insulin, decreased to 35 units due to hypoglycemia -Novolog tid added 11/22 -Continue sliding scale insulin  Lupus anticoagulant syndrome with history of VTE -Continue anticoagulation with warfarin  Recent diagnosis of atrial flutter -Continue on amiodarone -She is anticoagulated -She is also on digoxin with supratherapeutic levels. -Digoxin level now 1.7, but continue to hold given some bradycardia-possibly restart by a.m.   DVT prophylaxis:Coumadin Code Status:Full code Family Communication:Discussed withhusband at bedside 11/19 Disposition Plan:Status is: Inpatient  Remains inpatient appropriate because:Inpatient level of care appropriate due to severity of illness   Dispo: The patient is from:Home Anticipated d/c is OE:[id number] HH Anticipated d/c date is: 2-3days Patient currently is not medically stable to d/c.She continues to have significant hypoxemia and volume overload requiring aggressive diuresis.  Antimicrobials:   None   Subjective: Patient seen and evaluated today.  She states that she overall feels tired, and has been urinating quite frequently.  She denies any shortness of breath or chest pain.  Objective: Vitals:   07/28/20 0133 07/28/20 0511 07/28/20 0754 07/28/20 0803  BP: 108/80 112/78    Pulse: 67 (!) 56    Resp: 20 20    Temp: 98.3 F (36.8 C) 97.8 F (36.6 C)    TempSrc: Oral Oral    SpO2: 92% 92% 92% 91%  Weight:  95.9 kg    Height:        Intake/Output Summary (Last 24  hours) at  07/28/2020 0919 Last data filed at 07/28/2020 0500 Gross per 24 hour  Intake 290 ml  Output 2200 ml  Net -1910 ml   Filed Weights   07/26/20 0500 07/27/20 0500 07/28/20 0511  Weight: 96.9 kg 96.5 kg 95.9 kg    Examination:  General exam: Appears calm and comfortable  Respiratory system: Clear to auscultation. Respiratory effort normal.  Trach collar with 8 L and nasal cannula with 4 L supplementation. Cardiovascular system: S1 & S2 heard, RRR.  Gastrointestinal system: Abdomen is nondistended, soft and nontender.  Central nervous system: Alert and awake Extremities: No significant edema Skin: No rashes, lesions or ulcers Psychiatry: Flat affect    Data Reviewed: I have personally reviewed following labs and imaging studies  CBC: Recent Labs  Lab 07/21/20 1152 07/21/20 1152 07/23/20 0411 07/24/20 0601 07/25/20 0401 07/26/20 0456 07/27/20 0451  WBC 6.8   < > 6.8 7.3 7.6 8.0 11.8*  NEUTROABS 5.4  --   --   --   --   --   --   HGB 11.8*   < > 10.5* 10.6* 11.7* 12.3 13.0  HCT 38.5   < > 35.1* 35.3* 38.1 39.7 41.7  MCV 102.7*   < > 104.2* 104.1* 103.5* 101.8* 99.5  PLT 244   < > 246 256 272 226 285   < > = values in this interval not displayed.   Basic Metabolic Panel: Recent Labs  Lab 07/24/20 0601 07/25/20 0401 07/26/20 0456 07/27/20 0451 07/28/20 0641  NA 137 140 141 139 139  K 4.3 4.5 4.7 3.9 4.1  CL 89* 88* 88* 85* 86*  CO2 38* 41* 41* 44* 42*  GLUCOSE 277* 150* 167* 59* 93  BUN 37* 40* 42* 42* 47*  CREATININE 1.27* 1.12* 1.10* 1.14* 1.15*  CALCIUM 8.5* 8.8* 9.1 9.2 8.9  MG 1.9 2.0 2.2 2.3 2.5*   GFR: Estimated Creatinine Clearance: 55.5 mL/min (A) (by C-G formula based on SCr of 1.15 mg/dL (H)). Liver Function Tests: No results for input(s): AST, ALT, ALKPHOS, BILITOT, PROT, ALBUMIN in the last 168 hours. No results for input(s): LIPASE, AMYLASE in the last 168 hours. No results for input(s): AMMONIA in the last 168 hours. Coagulation  Profile: Recent Labs  Lab 07/24/20 0601 07/25/20 0401 07/26/20 1019 07/27/20 0451 07/28/20 0641  INR 2.5* 2.7* 2.5* 2.4* 1.9*   Cardiac Enzymes: No results for input(s): CKTOTAL, CKMB, CKMBINDEX, TROPONINI in the last 168 hours. BNP (last 3 results) No results for input(s): PROBNP in the last 8760 hours. HbA1C: No results for input(s): HGBA1C in the last 72 hours. CBG: Recent Labs  Lab 07/27/20 0714 07/27/20 1125 07/27/20 1625 07/27/20 2001 07/28/20 0748  GLUCAP 66* 268* 150* 365* 101*   Lipid Profile: No results for input(s): CHOL, HDL, LDLCALC, TRIG, CHOLHDL, LDLDIRECT in the last 72 hours. Thyroid Function Tests: No results for input(s): TSH, T4TOTAL, FREET4, T3FREE, THYROIDAB in the last 72 hours. Anemia Panel: No results for input(s): VITAMINB12, FOLATE, FERRITIN, TIBC, IRON, RETICCTPCT in the last 72 hours. Sepsis Labs: Recent Labs  Lab 07/21/20 1153 07/21/20 1347  LATICACIDVEN 2.9* 1.7    Recent Results (from the past 240 hour(s))  Respiratory Panel by RT PCR (Flu A&B, Covid) - Nasopharyngeal Swab     Status: None   Collection Time: 07/21/20 11:19 AM   Specimen: Nasopharyngeal Swab  Result Value Ref Range Status   SARS Coronavirus 2 by RT PCR NEGATIVE NEGATIVE Final    Comment: (NOTE) SARS-CoV-2  target nucleic acids are NOT DETECTED.  The SARS-CoV-2 RNA is generally detectable in upper respiratoy specimens during the acute phase of infection. The lowest concentration of SARS-CoV-2 viral copies this assay can detect is 131 copies/mL. A negative result does not preclude SARS-Cov-2 infection and should not be used as the sole basis for treatment or other patient management decisions. A negative result may occur with  improper specimen collection/handling, submission of specimen other than nasopharyngeal swab, presence of viral mutation(s) within the areas targeted by this assay, and inadequate number of viral copies (<131 copies/mL). A negative result  must be combined with clinical observations, patient history, and epidemiological information. The expected result is Negative.  Fact Sheet for Patients:  PinkCheek.be  Fact Sheet for Healthcare Providers:  GravelBags.it  This test is no t yet approved or cleared by the [place] FDA and  has been authorized for detection and/or diagnosis of SARS-CoV-2 by FDA under an Emergency Use Authorization (EUA). This EUA will remain  in effect (meaning this test can be used) for the duration of the COVID-19 declaration under Section 564(b)(1) of the Act, 21 U.S.C. section 360bbb-3(b)(1), unless the authorization is terminated or revoked sooner.     Influenza A by PCR NEGATIVE NEGATIVE Final   Influenza B by PCR NEGATIVE NEGATIVE Final    Comment: (NOTE) The Xpert Xpress SARS-CoV-2/FLU/RSV assay is intended as an aid in  the diagnosis of influenza from Nasopharyngeal swab specimens and  should not be used as a sole basis for treatment. Nasal washings and  aspirates are unacceptable for Xpert Xpress SARS-CoV-2/FLU/RSV  testing.  Fact Sheet for Patients: PinkCheek.be  Fact Sheet for Healthcare Providers: GravelBags.it  This test is not yet approved or cleared by the [place] FDA and  has been authorized for detection and/or diagnosis of SARS-CoV-2 by  FDA under an Emergency Use Authorization (EUA). This EUA will remain  in effect (meaning this test can be used) for the duration of the  Covid-19 declaration under Section 564(b)(1) of the Act, 21  U.S.C. section 360bbb-3(b)(1), unless the authorization is  terminated or revoked. Performed at [facility], [address] [place], [place] [address]   Culture, blood (routine x 2)     Status: None   Collection Time: 07/21/20 11:52 AM   Specimen: BLOOD  Result Value Ref Range Status   Specimen Description BLOOD SITE  NOT SPECIFIED DRAWN BY RN  Final   Special Requests   Final    BOTTLES DRAWN AEROBIC AND ANAEROBIC Blood Culture adequate volume   Culture   Final    NO GROWTH 5 DAYS Performed at [facility], [address] [place], [place] [address]    Report Status 07/26/2020 FINAL  Final  Culture, blood (routine x 2)     Status: None   Collection Time: 07/21/20 12:41 PM   Specimen: BLOOD  Result Value Ref Range Status   Specimen Description BLOOD SITE NOT SPECIFIED  Final   Special Requests   Final    BOTTLES DRAWN AEROBIC AND ANAEROBIC Blood Culture results may not be optimal due to an inadequate volume of blood received in culture bottles   Culture   Final    NO GROWTH 5 DAYS Performed at [facility], [address] [place],  [address]    Report Status 07/26/2020 FINAL  Final  Culture, respiratory (non-expectorated)     Status: None   Collection Time: 07/21/20  3:11 PM   Specimen: Tracheal Aspirate; Respiratory  Result Value  Ref Range Status   Specimen Description   Final    TRACHEAL ASPIRATE Performed at [facility], [address] [place], [place] [address]    Special Requests   Final    NONE Performed at [facility], [address] [place], [place] [address]    Gram Stain   Final    RARE WBC PRESENT,BOTH PMN AND MONONUCLEAR RARE GRAM POSITIVE RODS RARE GRAM NEGATIVE RODS Performed at [facility], [place] [address] [place],  [address]    Culture RARE PSEUDOMONAS AERUGINOSA  Final   Report Status 07/24/2020 FINAL  Final   Organism ID, Bacteria PSEUDOMONAS AERUGINOSA  Final      Susceptibility   Pseudomonas aeruginosa - MIC*    CEFTAZIDIME 4 SENSITIVE Sensitive     CIPROFLOXACIN <=0.25 SENSITIVE Sensitive     GENTAMICIN <=1 SENSITIVE Sensitive     IMIPENEM 1 SENSITIVE Sensitive     PIP/TAZO 8 SENSITIVE Sensitive     CEFEPIME 2 SENSITIVE Sensitive     * RARE PSEUDOMONAS AERUGINOSA         Radiology Studies: No results found.      Scheduled  Meds: . amiodarone  200 mg Oral Daily  . budesonide (PULMICORT) nebulizer solution  1 mg Nebulization BID  . Chlorhexidine Gluconate Cloth  6 each Topical Daily  . gabapentin  900 mg Oral QHS  . insulin aspart  0-20 Units Subcutaneous TID WC  . insulin aspart  0-5 Units Subcutaneous QHS  . insulin aspart  5 Units Subcutaneous TID WC  . insulin glargine  35 Units Subcutaneous QHS  . ipratropium-albuterol  3 mL Nebulization TID  . methylPREDNISolone (SOLU-MEDROL) injection  40 mg Intravenous Q12H  . metolazone  2.5 mg Oral Once  . pantoprazole  40 mg Oral Daily  . potassium chloride SA  40 mEq Oral Daily  . sertraline  50 mg Oral QHS  . tamsulosin  0.4 mg Oral Daily  . warfarin  2.5 mg Oral ONCE-1600  . Warfarin - Pharmacist Dosing Inpatient   Does not apply q1600   Continuous Infusions: . bumetanide (BUMEX) IV 3 mg (07/27/20 2249)     LOS: 7 days    Time spent: 30 minutes    Nakema Fake Darleen Crocker, DO [facility]  If 7PM-7AM, please contact night-coverage [email or web address] 07/28/2020, 9:19 AM

## 2020-07-28 NOTE — Progress Notes (Signed)
ANTICOAGULATION CONSULT NOTE   Pharmacy Consult for Warfarin Indication: History of VTE   Patient Measurements: Height: 4\' 10"  (147.3 cm) Weight: 95.9 kg (211 lb 6.7 oz) IBW/kg (Calculated) : 40.9  Vital Signs: Temp: 97.8 F (36.6 C) (11/22 0511) Temp Source: Oral (11/22 0511) BP: 112/78 (11/22 0511) Pulse Rate: 56 (11/22 0511)  Labs: Recent Labs    07/26/20 0456 07/26/20 1019 07/27/20 0451 07/28/20 0641  HGB 12.3  --  13.0  --   HCT 39.7  --  41.7  --   PLT 226  --  285  --   LABPROT  --  25.9* 25.1* 21.2*  INR  --  2.5* 2.4* 1.9*  CREATININE 1.10*  --  1.14* 1.15*    Estimated Creatinine Clearance: 55.5 mL/min (A) (by C-G formula based on SCr of 1.15 mg/dL (H)).  Assessment: 54 yr old female presented to [facility] ED with SOB. Medical hx includes lupus anticoagulant with hx of VTE, for which she is on anticoagulation with warfarin PTA (5 mg MWF, 2.5 mg other days of the week; per med rec, last dose was on the evening of 07/20/20). Pt also has recent dx of atrial flutter.   Major Drug Interaction: Target a 25- 50% reduction in weekly warfarin dose after 2 weeks of dual therapy with amiodarone and warfarin  INR 1.9-     Goal of Therapy:  INR 2-3 Monitor platelets by anticoagulation protocol: Yes   Plan:  Warfarin 2.5 mg x 1 dose Consider weekly dose reduction of warfarin d/t major interaction w/ amiodarone Daily INR and CBC Monitor for signs/symptoms of bleeding  Margot Ables, PharmD Clinical Pharmacist 07/28/2020 8:23 AM

## 2020-07-28 NOTE — Progress Notes (Signed)
Physical Therapy Treatment Patient Details Name: Theresa [name] MRN: [id number] DOB: 05/16/1966 Today's Date: 07/28/2020    History of Present Illness Theresa [name] is a 54 y.o. female with medical history significant of combined CHF, COPD, chronic respiratory failure, tracheostomy dependent, lupus anticoagulant with history of VTE on chronic anticoagulation therapy, was recently discharged from hospital on 11/1 after being treated for decompensated CHF.  Patient reports compliance with medications.  She had come back to cardiology office today as hospital follow-up and was noted to have increased weight.  She reports feeling generally weak, feels that her legs are heavier.  She also describes abdominal distention.  She has felt more short of breath.  She has had worsening cough productive of yellowish sputum.  She has noticed that she is wheezing more.  No chest pain.  No fever.  No vomiting.  No abdominal pain.  No diarrhea.  Review of records indicate that her weight is up approximately 6 pounds since her last discharge.  While in the cardiology office, she was noted to be hypoxic at 83% on chronic 4 L of oxygen.  She was sent to the emergency room for evaluation    PT Comments    Patient demonstrates labored movement for sitting up at bedside, good return for completing exercises, requested use of RW due to unsteadiness and demonstrates increased endurance/distance for ambulation using RW while on 4 LPM [place], with SpO2 dropping from 93% to 86% and no c/o of SOB.   Patient tolerated sitting up in chair after therapy with her spouse present in room.  Patient will benefit from continued physical therapy in hospital and recommended venue below to increase strength, balance, endurance for safe ADLs and gait.   Follow Up Recommendations  Home health PT     Equipment Recommendations  Rollator   Recommendations for Other Services       Precautions / Restrictions Precautions Precautions: None     Mobility  Bed Mobility Overal bed mobility: Modified Independent             General bed mobility comments: slightly increased time  Transfers Overall transfer level: Needs assistance Equipment used: Rolling walker (2 wheeled);None Transfers: Sit to/from [facility] Sit to Stand: Supervision;Modified independent (Device/Increase time) Stand pivot transfers: Modified independent (Device/Increase time);Supervision       General transfer comment: slightly unsteady  Ambulation/Gait Ambulation/Gait assistance: Supervision Gait Distance (Feet): 200 Feet Assistive device: Rolling walker (2 wheeled);None Gait Pattern/deviations: Decreased step length - right;Decreased step length - left;Decreased stride length Gait velocity: decreased   General Gait Details: occasional leaning on nearby objects for support and requested use of RW for ambulation demonstrating increased endurance/distance with SpO2 dropping from 93% to 86% while on 4 LPM O2, no loss of balance, limited secondary to fatigue   Stairs             Wheelchair Mobility    Modified Rankin (Stroke Patients Only)       Balance Overall balance assessment: Needs assistance Sitting-balance support: Feet supported;No upper extremity supported Sitting balance-Leahy Scale: Good Sitting balance - Comments: seated at EOB   Standing balance support: During functional activity;No upper extremity supported Standing balance-Leahy Scale: Fair Standing balance comment: fair/good using RW                            Cognition Arousal/Alertness: Awake/alert Behavior During Therapy: WFL for tasks assessed/performed Overall Cognitive Status: Within Functional Limits for  tasks assessed                                        Exercises General Exercises - Lower Extremity Long Arc Quad: Seated;AROM;Strengthening;Both;15 reps Hip Flexion/Marching:  Seated;AROM;Strengthening;Both;15 reps Toe Raises: Seated;AROM;Strengthening;Both;20 reps Heel Raises: Seated;AROM;Strengthening;Both;20 reps    General Comments        Pertinent Vitals/Pain Pain Assessment: No/denies pain    Home Living                      Prior Function            PT Goals (current goals can now be found in the care plan section) Acute Rehab PT Goals Patient Stated Goal: return home with family assist PT Goal Formulation: With patient Time For Goal Achievement: 07/30/20 Potential to Achieve Goals: Good Progress towards PT goals: Progressing toward goals    Frequency           PT Plan Current plan remains appropriate    Co-evaluation              AM-PAC PT "6 Clicks" Mobility   Outcome Measure  Help needed turning from your back to your side while in a flat bed without using bedrails?: None Help needed moving from lying on your back to sitting on the side of a flat bed without using bedrails?: A Little Help needed moving to and from a bed to a chair (including a wheelchair)?: None Help needed standing up from a chair using your arms (e.g., wheelchair or bedside chair)?: None Help needed to walk in hospital room?: A Little Help needed climbing 3-5 steps with a railing? : A Little 6 Click Score: 21    End of Session Equipment Utilized During Treatment: Oxygen Activity Tolerance: Patient tolerated treatment well;Patient limited by fatigue Patient left: in chair;with call bell/phone within reach;with family/visitor present Nurse Communication: Mobility status PT Visit Diagnosis: Unsteadiness on feet (R26.81);Other abnormalities of gait and mobility (R26.89);Muscle weakness (generalized) (M62.81)     Time: 5809-9833 PT Time Calculation (min) (ACUTE ONLY): 25 min  Charges:  $Gait Training: 8-22 mins $Therapeutic Exercise: 8-22 mins                     12:37 PM, 07/28/20 Lonell Grandchild, MPT Physical Therapist with  [facility] [phone] office [phone] mobile phone

## 2020-07-29 DIAGNOSIS — I5043 Acute on chronic combined systolic (congestive) and diastolic (congestive) heart failure: Secondary | ICD-10-CM | POA: Diagnosis not present

## 2020-07-29 DIAGNOSIS — J9621 Acute and chronic respiratory failure with hypoxia: Secondary | ICD-10-CM | POA: Diagnosis not present

## 2020-07-29 LAB — BASIC METABOLIC PANEL
Anion gap: 13 (ref 5–15)
BUN: 61 mg/dL — ABNORMAL HIGH (ref 6–20)
CO2: 43 mmol/L — ABNORMAL HIGH (ref 22–32)
Calcium: 9.4 mg/dL (ref 8.9–10.3)
Chloride: 80 mmol/L — ABNORMAL LOW (ref 98–111)
Creatinine, Ser: 1.34 mg/dL — ABNORMAL HIGH (ref 0.44–1.00)
GFR, Estimated: 47 mL/min — ABNORMAL LOW (ref >60)
Glucose, Bld: 99 mg/dL (ref 70–99)
Potassium: 3.7 mmol/L (ref 3.5–5.1)
Sodium: 136 mmol/L (ref 135–145)

## 2020-07-29 LAB — CBC
HCT: 47.2 % — ABNORMAL HIGH (ref 36.0–46.0)
Hemoglobin: 15.2 g/dL — ABNORMAL HIGH (ref 12.0–15.0)
MCH: 31.2 pg (ref 26.0–34.0)
MCHC: 32.2 g/dL (ref 30.0–36.0)
MCV: 96.9 fL (ref 80.0–100.0)
Platelets: 274 10*3/uL (ref 150–400)
RBC: 4.87 MIL/uL (ref 3.87–5.11)
RDW: 15.9 % — ABNORMAL HIGH (ref 11.5–15.5)
WBC: 13.2 10*3/uL — ABNORMAL HIGH (ref 4.0–10.5)
nRBC: 0 % (ref 0.0–0.2)

## 2020-07-29 LAB — GLUCOSE, CAPILLARY
Glucose-Capillary: 86 mg/dL (ref 70–99)
Glucose-Capillary: 296 mg/dL — ABNORMAL HIGH (ref 70–99)

## 2020-07-29 LAB — PROTIME-INR
INR: 1.8 — ABNORMAL HIGH (ref 0.8–1.2)
Prothrombin Time: 20 seconds — ABNORMAL HIGH (ref 11.4–15.2)

## 2020-07-29 LAB — MAGNESIUM: Magnesium: 2.5 mg/dL — ABNORMAL HIGH (ref 1.7–2.4)

## 2020-07-29 MED ORDER — AMIODARONE HCL 200 MG PO TABS: 200.0000 mg | ORAL_TABLET | Freq: Every day | ORAL | 0 refills | Status: DC

## 2020-07-29 MED ORDER — PREDNISONE 20 MG PO TABS: 40.0000 mg | ORAL_TABLET | Freq: Every day | ORAL | 0 refills | Status: DC

## 2020-07-29 MED ORDER — BUMETANIDE 2 MG PO TABS
2.0000 mg | ORAL_TABLET | Freq: Every day | ORAL | 1 refills | Status: DC
Start: 2020-07-29 — End: 2020-11-11

## 2020-07-29 NOTE — TOC Transition Note (Signed)
Transition of Care [facility]) - CM/SW Discharge Note   Patient Details  Name: Theresa [name] MRN: [id number] Date of Birth: [birth year]  [facility]) CM/SW Contact:  Boneta Lucks, RN Phone Number: 07/29/2020, 10:30 AM   Clinical Narrative:   Patient discharging home today. Orders are in for [facility]. Linda updated. Added to AVS. Follow up appointment made.    Final next level of care: [facility] Barriers to Discharge: Barriers Resolved   Patient Goals and CMS Choice Patient states their goals for this hospitalization and ongoing recovery are:: to return home with [facility]. CMS Medicare.gov Compare Post Acute Care list provided to:: Patient    Discharge Placement    Patient to be transferred to facility by: Husband   Patient and family notified of of transfer: 07/29/20  Discharge Plan and [place] with [place]  Readmission Risk Interventions Readmission Risk Prevention Plan 07/22/2020 05/17/2019  Transportation Screening Complete Complete  PCP or Specialist Appt within 3-5 Days - Complete  HRI or [place] - Complete  Social Work Consult for [place] Planning/Counseling - Complete  Palliative Care Screening - Not Applicable  Medication Review Press photographer) Complete Complete  Palliative Care Screening Not Complete -  [place] Not Applicable -  Some recent data might be hidden

## 2020-07-29 NOTE — Progress Notes (Signed)
Tried to educate patient on importance of ATC.  Patient had cap in trach when I came into room to do treatment.  I explained that patient was not getting the humidification she needed due to cap on trach, but patient did not want cap taken out.  I told her that we could not run treatments through ATC if she kept cap in due to fact that she would not be getting the needed medications that way, when she caps it, it bypasses the trach and she can only get her O2 and treatments through her upper airway.  Patient refuses to take cap out so her trach will have dried secretions for lack of humidification through that airway.  Patient is currently on 4L [place] as well and patient sats are around 94%.  Will continue to encourage patient to wear Cap less so she can get needed humidification.

## 2020-07-29 NOTE — Care Management Important Message (Signed)
Important Message  Patient Details  Name: Theresa [name] MRN: [id number] Date of Birth: [birth year]   Medicare Important Message Given:  Yes     [name] 07/29/2020, 11:28 AM

## 2020-07-29 NOTE — Discharge Summary (Signed)
Physician Discharge Summary  Theresa [name] STM:[id number] DOB: 03/04/1966 DOA: 07/21/2020  PCP: Monico Blitz, MD  Admit date: 07/21/2020  Discharge date: 07/29/2020  Admitted From:Home  Disposition:  Home  Recommendations for Outpatient Follow-up:  1. Follow up with PCP in 1-2 weeks 2. Please obtain BMP/CBC in one week 3. Follow-up with cardiology in the near future to adjust medications as appropriate 4. Continue on home medications as noted below on home metoprolol and home digoxin due to ongoing bradycardia, asymptomatic.  May resume in outpatient setting as appropriate 5. Continue on prednisone taper as prescribed  Home Health: Yes with RN and PT  Equipment/Devices: Chronic on 4 L nasal cannula  Discharge Condition: Stable  CODE STATUS: Full  Diet recommendation: Heart Healthy/carb modified with 1500 mL fluid restriction  Brief/Interim Summary: 54 year old female with a history of combined CHF, COPD, chronic respiratory failure, tracheostomy dependent, lupus anticoagulant with history of VTE on chronic anticoagulation, brought to the hospital with shortness of breath. Admitted for increased weight gain secondary to CHF exacerbation as well as an element of COPD exacerbation.  -She was noted to have acute on chronic hypoxemic respiratory failure and usually wears 4 L nasal cannula at baseline.  She was requiring aggressive IV diuresis with IV Bumex twice daily and also required a dose of metolazone to help her diurese further.  She appears to have diuresed to her renal tolerance and is near her baseline weight of 200 pounds and is currently 205 pounds.  She states she is feeling very well and back to her usual baseline today.  She denies any further shortness of breath and is eager for discharge.  She is back to her 4 L nasal cannula at this time and will continue her home Bumex as prior.  She appears to be careless with the use of her medications and is usually noncompliant, but  agrees to continue taking these medications on a regular basis as prescribed with close follow-up.  Discharge Diagnoses:  Active Problems:   Essential hypertension   COPD exacerbation (HCC)   Lupus anticoagulant syndrome ([place])   Tracheostomy in place [facility]), chronic since 2002   Uncontrolled type 2 diabetes mellitus with hyperglycemia, with long-term current use of insulin (HCC)   Acute on chronic combined systolic and diastolic HF (heart failure) (HCC)   Acute on chronic respiratory failure (HCC)  Principal discharge diagnosis: Acute on chronic hypoxemic respiratory failure secondary to acute on chronic diastolic and systolic CHF exacerbation in the setting of medication noncompliance as well as COPD exacerbation.  Discharge Instructions  Discharge Instructions    (HEART FAILURE PATIENTS) Call MD:  Anytime you have any of the following symptoms: 1) 3 pound weight gain in 24 hours or 5 pounds in 1 week 2) shortness of breath, with or without a dry hacking cough 3) swelling in the hands, feet or stomach 4) if you have to sleep on extra pillows at night in order to breathe.   Complete by: As directed    Diet - low sodium heart healthy   Complete by: As directed    Increase activity slowly   Complete by: As directed      Allergies as of 07/29/2020      Reactions   Penicillins Rash   Has patient had a PCN reaction causing immediate rash, facial/tongue/throat swelling, SOB or lightheadedness with hypotension: Yes Has patient had a PCN reaction causing severe rash involving mucus membranes or skin necrosis: No Has patient had a PCN reaction that  required hospitalization No Has patient had a PCN reaction occurring within the last 10 years: No If all of the above answers are "NO", then may proceed with Cephalosporin use. REACTION: rash Pt has tolerated cefepime in the past.   Ciprofloxacin    nausea   Ibuprofen Rash   swelling in leg      Medication List    STOP taking these  medications   cephALEXin 500 MG capsule Commonly known as: KEFLEX   digoxin 0.125 MG tablet Commonly known as: LANOXIN   metoprolol tartrate 25 MG tablet Commonly known as: LOPRESSOR     TAKE these medications   albuterol 108 (90 Base) MCG/ACT inhaler Commonly known as: VENTOLIN HFA Inhale 2 puffs into the lungs every 4 (four) hours as needed for wheezing or shortness of breath.   albuterol (2.5 MG/3ML) 0.083% nebulizer solution Commonly known as: PROVENTIL Take 3 mLs (2.5 mg total) by nebulization every 4 (four) hours as needed for wheezing.   amiodarone 200 MG tablet Commonly known as: PACERONE Take 1 tablet (200 mg total) by mouth daily. Start taking on: July 30, 2020 What changed:   how much to take  how to take this  when to take this  additional instructions   bumetanide 2 MG tablet Commonly known as: BUMEX Take 2 mg by mouth daily. Takes 6 mg am and 4 mg pm   gabapentin 300 MG capsule Commonly known as: NEURONTIN Take 3 capsules (900 mg total) by mouth at bedtime.   LORazepam 0.5 MG tablet Commonly known as: ATIVAN Take 0.5 mg by mouth every 6 (six) hours as needed for anxiety.   mometasone 0.1 % ointment Commonly known as: ELOCON Apply 1 application topically daily as needed.   nitroGLYCERIN 0.4 MG SL tablet Commonly known as: NITROSTAT Place 0.4 mg under the tongue every 5 (five) minutes as needed for chest pain.   NovoLOG FlexPen 100 UNIT/ML FlexPen Generic drug: insulin aspart Inject 1-10 Units into the skin 3 (three) times daily before meals.   NYQUIL PO Take 20 mLs by mouth daily.   Ozempic (0.25 or 0.5 MG/DOSE) 2 MG/1.5ML Sopn Generic drug: Semaglutide(0.25 or 0.5MG /DOS) Inject 0.5 mg into the skin every Wednesday.   pantoprazole 20 MG tablet Commonly known as: PROTONIX Take 20 mg by mouth daily.   polyethylene glycol 17 g packet Commonly known as: MIRALAX / GLYCOLAX Take 17 g by mouth daily as needed for mild constipation.    potassium chloride SA 20 MEQ tablet Commonly known as: KLOR-CON Take 2 tablets (40 mEq total) by mouth daily.   predniSONE 20 MG tablet Commonly known as: Deltasone Take 2 tablets (40 mg total) by mouth daily for 5 days.   sertraline 50 MG tablet Commonly known as: ZOLOFT Take 50 mg by mouth at bedtime.   tamsulosin 0.4 MG Caps capsule Commonly known as: FLOMAX Take 0.4 mg by mouth daily.   traMADol 50 MG tablet Commonly known as: ULTRAM Take 50 mg by mouth 3 (three) times daily as needed for moderate pain.   Tyler Aas FlexTouch 100 UNIT/ML FlexTouch Pen Generic drug: insulin degludec Inject 30 Units into the skin at bedtime.   warfarin 2.5 MG tablet Commonly known as: COUMADIN Take 1-2 tablets (2.5-5 mg total) by mouth See admin instructions. Mon,Wed,Fri take 5mg   2.5mg  all other days            Durable Medical Equipment  (From admission, onward)         Start  Ordered   07/28/20 1226  For home use only DME Walker rolling  Once       Question Answer Comment  Walker: With 5 Inch Wheels   Patient needs a walker to treat with the following condition COPD (chronic obstructive pulmonary disease) ([place])   Patient needs a walker to treat with the following condition Respiratory compromise      07/28/20 [place] Follow up.   Why:   RN/ PT       Monico Blitz, MD Follow up in 1 week(s).   Specialty: Internal Medicine Contact information: [place] [address] [phone]        [name], MD. Schedule an appointment as soon as possible for a visit in 2 week(s).   Specialty: Cardiology Contact information: [address] [place] [address] [phone]              Allergies  Allergen Reactions   Penicillins Rash    Has patient had a PCN reaction causing immediate rash, facial/tongue/throat swelling, SOB or lightheadedness with hypotension: Yes Has  patient had a PCN reaction causing severe rash involving mucus membranes or skin necrosis: No Has patient had a PCN reaction that required hospitalization No Has patient had a PCN reaction occurring within the last 10 years: No If all of the above answers are "NO", then may proceed with Cephalosporin use.   REACTION: rash Pt has tolerated cefepime in the past.   Ciprofloxacin     nausea   Ibuprofen Rash    swelling in leg     Consultations:  None   Procedures/Studies: DG Chest Port 1 View  Result Date: 07/28/2020 CLINICAL DATA:  Shortness of breath. EXAM: PORTABLE CHEST 1 VIEW COMPARISON:  July 21, 2020. FINDINGS: Stable cardiomegaly. Tracheostomy tube is in good position. Status post coronary bypass graft. No pneumothorax is noted. Mild bibasilar subsegmental atelectasis is noted with small pleural effusions. Bony thorax is unremarkable. IMPRESSION: Mild bibasilar subsegmental atelectasis with small pleural effusions. Electronically Signed   By: Marijo Conception M.D.   On: 07/28/2020 09:31   DG Chest Port 1 View  Result Date: 07/21/2020 CLINICAL DATA:  Shortness of breath EXAM: PORTABLE CHEST 1 VIEW COMPARISON:  07/03/2020 FINDINGS: Tracheostomy tube remains in place. Post CABG changes. Stable cardiomegaly. There is pulmonary vascular congestion with diffuse bilateral interstitial prominence. Streaky perihilar and bibasilar opacities. No large pleural fluid collection. No pneumothorax. IMPRESSION: Findings suggestive of CHF with pulmonary edema. Streaky perihilar and bibasilar opacities may represent atelectasis, alveolar edema, versus infection. Electronically Signed   By: Davina Poke D.O.   On: 07/21/2020 11:46   DG Chest Port 1 View  Result Date: 07/03/2020 CLINICAL DATA:  Shortness of breath. EXAM: PORTABLE CHEST 1 VIEW COMPARISON:  06/29/2020 FINDINGS: 0452 hours. The cardio pericardial silhouette is enlarged. There is pulmonary vascular congestion without overt  pulmonary edema. Interstitial markings are diffusely coarsened with chronic features. Staple line projects over the right lung base with associated pleuroparenchymal scarring. Interval decrease in the basilar infiltrative opacity described previously. Bones are diffusely demineralized. Telemetry leads overlie the chest. IMPRESSION: Enlarged cardiopericardial silhouette with pulmonary vascular congestion. Interval improvement in basilar aeration. Electronically Signed   By: Misty Stanley M.D.   On: 07/03/2020 07:17   ECHOCARDIOGRAM COMPLETE  Result Date: 07/02/2020    ECHOCARDIOGRAM REPORT   Patient Name:   Shaina  Charline Bills Date of Exam: 07/02/2020 Medical Rec #:  [id number]       Height:       58.0 in Accession #:    [id number]      Weight:       213.8 lb Date of Birth:  1966/01/21       BSA:          1.874 m Patient Age:    73 years        BP:           106/59 mmHg Patient Gender: F               HR:           89 bpm. Exam Location:  [facility] Procedure: 2D Echo Indications:    Dyspnea 786.09 / R06.00  History:        Patient has prior history of Echocardiogram examinations, most                 recent 04/20/2020. CHF, CAD, Prior CABG, Arrythmias:Atrial                 Flutter, Signs/Symptoms:Bacteremia; Risk Factors:Diabetes,                 Hypertension and Current Smoker. PHTN.  Sonographer:    Leavy Cella RDCS (AE) Referring Phys: [id number] [place]  1. Difficult asssessment of LVEF in setting of aflutter as well as limited visualization. . Left ventricular ejection fraction, by estimation, is 50%. The left ventricle has mildly decreased function. The left ventricle demonstrates global hypokinesis. There is mild left ventricular hypertrophy. Left ventricular diastolic parameters are indeterminate.  2. Right ventricular systolic function was not well visualized. The right ventricular size is not well visualized.  3. Left atrial size was mildly dilated.  4. The mitral valve is normal in  structure. No evidence of mitral valve regurgitation. No evidence of mitral stenosis.  5. The aortic valve is tricuspid. Aortic valve regurgitation is not visualized. No aortic stenosis is present.  6. The inferior vena cava is normal in size with greater than 50% respiratory variability, suggesting right atrial pressure of 3 mmHg. FINDINGS  Left Ventricle: Difficult asssessment of LVEF in setting of aflutter as well as limited visualization. Left ventricular ejection fraction, by estimation, is 50%. The left ventricle has mildly decreased function. The left ventricle demonstrates global hypokinesis. The left ventricular internal cavity size was normal in size. There is mild left ventricular hypertrophy. Left ventricular diastolic parameters are indeterminate. Right Ventricle: The right ventricular size is not well visualized. Right vetricular wall thickness was not well visualized. Right ventricular systolic function was not well visualized. Left Atrium: Left atrial size was mildly dilated. Right Atrium: Right atrial size was normal in size. Pericardium: There is no evidence of pericardial effusion. Mitral Valve: The mitral valve is normal in structure. No evidence of mitral valve regurgitation. No evidence of mitral valve stenosis. Tricuspid Valve: The tricuspid valve is normal in structure. Tricuspid valve regurgitation is mild . No evidence of tricuspid stenosis. Aortic Valve: The aortic valve is tricuspid. Aortic valve regurgitation is not visualized. No aortic stenosis is present. Aortic valve mean gradient measures 5.6 mmHg. Aortic valve peak gradient measures 13.8 mmHg. Aortic valve area, by VTI measures 1.51  cm. Pulmonic Valve: The pulmonic valve was not well visualized. Pulmonic valve regurgitation is not visualized. No evidence of pulmonic stenosis. Aorta: The aortic root is normal in size and structure. Pulmonary  Artery: Indeterminant PASP, inadequate TR jet. Venous: The inferior vena cava was not well  visualized. The inferior vena cava is normal in size with greater than 50% respiratory variability, suggesting right atrial pressure of 3 mmHg. IAS/Shunts: The interatrial septum was not well visualized.  LEFT VENTRICLE PLAX 2D LVIDd:         3.37 cm  Diastology LVIDs:         2.60 cm  LV e' medial:    5.10 cm/s LV PW:         1.28 cm  LV E/e' medial:  14.8 LV IVS:        1.18 cm  LV e' lateral:   7.13 cm/s LVOT diam:     1.80 cm  LV E/e' lateral: 10.6 LV SV:         41 LV SV Index:   22 LVOT Area:     2.54 cm  RIGHT VENTRICLE RV S prime:     9.98 cm/s TAPSE (M-mode): 1.5 cm LEFT ATRIUM           Index       RIGHT ATRIUM           Index LA diam:      4.60 cm 2.46 cm/m  RA Area:     14.40 cm LA Vol (A4C): 55.7 ml 29.73 ml/m RA Volume:   36.60 ml  19.53 ml/m  AORTIC VALVE AV Area (Vmax):    1.62 cm AV Area (Vmean):   1.51 cm AV Area (VTI):     1.51 cm AV Vmax:           185.81 cm/s AV Vmean:          107.569 cm/s AV VTI:            0.269 m AV Peak Grad:      13.8 mmHg AV Mean Grad:      5.6 mmHg LVOT Vmax:         118.15 cm/s LVOT Vmean:        63.734 cm/s LVOT VTI:          0.159 m LVOT/AV VTI ratio: 0.59  AORTA Ao Root diam: 2.80 cm MITRAL VALVE               TRICUSPID VALVE MV Area (PHT): 4.71 cm    TR Peak grad:   44.9 mmHg MV Decel Time: 161 msec    TR Vmax:        335.00 cm/s MV E velocity: 75.50 cm/s MV A velocity: 29.50 cm/s  SHUNTS MV E/A ratio:  2.56        Systemic VTI:  0.16 m                            Systemic Diam: 1.80 cm Carlyle Dolly MD Electronically signed by Carlyle Dolly MD Signature Date/Time: 07/02/2020/12:18:41 PM    Final      Discharge Exam: Vitals:   07/29/20 0741 07/29/20 0829  BP:  117/73  Pulse:  (!) 57  Resp:  16  Temp:  97.7 F (36.5 C)  SpO2: 96% 94%   Vitals:   07/29/20 0440 07/29/20 0528 07/29/20 0741 07/29/20 0829  BP:  108/85  117/73  Pulse: 63 (!) 59  (!) 57  Resp: 20 17  16   Temp:  98 F (36.7 C)  97.7 F (36.5 C)  TempSrc:  Oral  Oral  SpO2:  94%  94% 96% 94%  Weight:  93.3 kg    Height:        General: Pt is alert, awake, not in acute distress, obese Cardiovascular: RRR, S1/S2 +, no rubs, no gallops Respiratory: CTA bilaterally, no wheezing, no rhonchi, on 4 L nasal cannula oxygen Abdominal: Soft, NT, ND, bowel sounds + Extremities: no edema, no cyanosis    The results of significant diagnostics from this hospitalization (including imaging, microbiology, ancillary and laboratory) are listed below for reference.     Microbiology: Recent Results (from the past 240 hour(s))  Respiratory Panel by RT PCR (Flu A&B, Covid) - Nasopharyngeal Swab     Status: None   Collection Time: 07/21/20 11:19 AM   Specimen: Nasopharyngeal Swab  Result Value Ref Range Status   SARS Coronavirus 2 by RT PCR NEGATIVE NEGATIVE Final    Comment: (NOTE) SARS-CoV-2 target nucleic acids are NOT DETECTED.  The SARS-CoV-2 RNA is generally detectable in upper respiratoy specimens during the acute phase of infection. The lowest concentration of SARS-CoV-2 viral copies this assay can detect is 131 copies/mL. A negative result does not preclude SARS-Cov-2 infection and should not be used as the sole basis for treatment or other patient management decisions. A negative result may occur with  improper specimen collection/handling, submission of specimen other than nasopharyngeal swab, presence of viral mutation(s) within the areas targeted by this assay, and inadequate number of viral copies (<131 copies/mL). A negative result must be combined with clinical observations, patient history, and epidemiological information. The expected result is Negative.  Fact Sheet for Patients:  PinkCheek.be  Fact Sheet for Healthcare Providers:  GravelBags.it  This test is no t yet approved or cleared by the [place] FDA and  has been authorized for detection and/or diagnosis of SARS-CoV-2 by FDA  under an Emergency Use Authorization (EUA). This EUA will remain  in effect (meaning this test can be used) for the duration of the COVID-19 declaration under Section 564(b)(1) of the Act, 21 U.S.C. section 360bbb-3(b)(1), unless the authorization is terminated or revoked sooner.     Influenza A by PCR NEGATIVE NEGATIVE Final   Influenza B by PCR NEGATIVE NEGATIVE Final    Comment: (NOTE) The Xpert Xpress SARS-CoV-2/FLU/RSV assay is intended as an aid in  the diagnosis of influenza from Nasopharyngeal swab specimens and  should not be used as a sole basis for treatment. Nasal washings and  aspirates are unacceptable for Xpert Xpress SARS-CoV-2/FLU/RSV  testing.  Fact Sheet for Patients: PinkCheek.be  Fact Sheet for Healthcare Providers: GravelBags.it  This test is not yet approved or cleared by the [place] FDA and  has been authorized for detection and/or diagnosis of SARS-CoV-2 by  FDA under an Emergency Use Authorization (EUA). This EUA will remain  in effect (meaning this test can be used) for the duration of the  Covid-19 declaration under Section 564(b)(1) of the Act, 21  U.S.C. section 360bbb-3(b)(1), unless the authorization is  terminated or revoked. Performed at [facility], [address] [place], [place] [address]   Culture, blood (routine x 2)     Status: None   Collection Time: 07/21/20 11:52 AM   Specimen: BLOOD  Result Value Ref Range Status   Specimen Description BLOOD SITE NOT SPECIFIED DRAWN BY RN  Final   Special Requests   Final    BOTTLES DRAWN AEROBIC AND ANAEROBIC Blood Culture adequate volume   Culture   Final    NO GROWTH 5 DAYS Performed at [facility], [address], [place], [place] [address]    Report Status 07/26/2020 FINAL  Final  Culture, blood (routine x 2)     Status: None   Collection Time: 07/21/20 12:41 PM   Specimen: BLOOD  Result Value Ref Range Status   Specimen  Description BLOOD SITE NOT SPECIFIED  Final   Special Requests   Final    BOTTLES DRAWN AEROBIC AND ANAEROBIC Blood Culture results may not be optimal due to an inadequate volume of blood received in culture bottles   Culture   Final    NO GROWTH 5 DAYS Performed at [facility], [address] [place], [place] [address]    Report Status 07/26/2020 FINAL  Final  Culture, respiratory (non-expectorated)     Status: None   Collection Time: 07/21/20  3:11 PM   Specimen: Tracheal Aspirate; Respiratory  Result Value Ref Range Status   Specimen Description   Final    TRACHEAL ASPIRATE Performed at [facility], [address] [place], [place] [address]    Special Requests   Final    NONE Performed at [facility], [address] [place], [place] [address]    Gram Stain   Final    RARE WBC PRESENT,BOTH PMN AND MONONUCLEAR RARE GRAM POSITIVE RODS RARE GRAM NEGATIVE RODS Performed at [facility], [place] [address], [place], [place] [address]    Culture RARE PSEUDOMONAS AERUGINOSA  Final   Report Status 07/24/2020 FINAL  Final   Organism ID, Bacteria PSEUDOMONAS AERUGINOSA  Final      Susceptibility   Pseudomonas aeruginosa - MIC*    CEFTAZIDIME 4 SENSITIVE Sensitive     CIPROFLOXACIN <=0.25 SENSITIVE Sensitive     GENTAMICIN <=1 SENSITIVE Sensitive     IMIPENEM 1 SENSITIVE Sensitive     PIP/TAZO 8 SENSITIVE Sensitive     CEFEPIME 2 SENSITIVE Sensitive     * RARE PSEUDOMONAS AERUGINOSA     Labs: BNP (last 3 results) Recent Labs    06/25/20 1633 07/03/20 0409 07/21/20 1152  BNP 536.0* 480.0* 237.6*   Basic Metabolic Panel: Recent Labs  Lab 07/25/20 0401 07/26/20 0456 07/27/20 0451 07/28/20 0641 07/29/20 0534  NA 140 141 139 139 136  K 4.5 4.7 3.9 4.1 3.7  CL 88* 88* 85* 86* 80*  CO2 41* 41* 44* 42* 43*  GLUCOSE 150* 167* 59* 93 99  BUN 40* 42* 42* 47* 61*  CREATININE 1.12* 1.10* 1.14* 1.15* 1.34*  CALCIUM 8.8* 9.1 9.2 8.9 9.4  MG 2.0 2.2 2.3 2.5* 2.5*    Liver Function Tests: No results for input(s): AST, ALT, ALKPHOS, BILITOT, PROT, ALBUMIN in the last 168 hours. No results for input(s): LIPASE, AMYLASE in the last 168 hours. No results for input(s): AMMONIA in the last 168 hours. CBC: Recent Labs  Lab 07/24/20 0601 07/25/20 0401 07/26/20 0456 07/27/20 0451 07/29/20 0534  WBC 7.3 7.6 8.0 11.8* 13.2*  HGB 10.6* 11.7* 12.3 13.0 15.2*  HCT 35.3* 38.1 39.7 41.7 47.2*  MCV 104.1* 103.5* 101.8* 99.5 96.9  PLT 256 272 226 285 274   Cardiac Enzymes: No results for input(s): CKTOTAL, CKMB, CKMBINDEX, TROPONINI in the last 168 hours. BNP: Invalid input(s): POCBNP CBG: Recent Labs  Lab 07/28/20 0748 07/28/20 1104 07/28/20 1618 07/28/20 2046 07/29/20 0802  GLUCAP 101* 326* 380* 202* 86   D-Dimer No results for input(s): DDIMER in the last 72 hours. Hgb A1c No results for input(s): HGBA1C in the last 72 hours. Lipid Profile No results  for input(s): CHOL, HDL, LDLCALC, TRIG, CHOLHDL, LDLDIRECT in the last 72 hours. Thyroid function studies No results for input(s): TSH, T4TOTAL, T3FREE, THYROIDAB in the last 72 hours.  Invalid input(s): FREET3 Anemia work up No results for input(s): VITAMINB12, FOLATE, FERRITIN, TIBC, IRON, RETICCTPCT in the last 72 hours. Urinalysis    Component Value Date/Time   COLORURINE AMBER (A) 06/15/2020 0119   APPEARANCEUR CLEAR 06/15/2020 0119   LABSPEC 1.014 06/15/2020 0119   PHURINE 6.0 06/15/2020 0119   GLUCOSEU NEGATIVE 06/15/2020 0119   HGBUR NEGATIVE 06/15/2020 0119   BILIRUBINUR SMALL (A) 06/15/2020 0119   KETONESUR NEGATIVE 06/15/2020 0119   PROTEINUR 30 (A) 06/15/2020 0119   NITRITE NEGATIVE 06/15/2020 0119   LEUKOCYTESUR NEGATIVE 06/15/2020 0119   Sepsis Labs Invalid input(s): PROCALCITONIN,  WBC,  LACTICIDVEN Microbiology Recent Results (from the past 240 hour(s))  Respiratory Panel by RT PCR (Flu A&B, Covid) - Nasopharyngeal Swab     Status: None   Collection Time: 07/21/20  11:19 AM   Specimen: Nasopharyngeal Swab  Result Value Ref Range Status   SARS Coronavirus 2 by RT PCR NEGATIVE NEGATIVE Final    Comment: (NOTE) SARS-CoV-2 target nucleic acids are NOT DETECTED.  The SARS-CoV-2 RNA is generally detectable in upper respiratoy specimens during the acute phase of infection. The lowest concentration of SARS-CoV-2 viral copies this assay can detect is 131 copies/mL. A negative result does not preclude SARS-Cov-2 infection and should not be used as the sole basis for treatment or other patient management decisions. A negative result may occur with  improper specimen collection/handling, submission of specimen other than nasopharyngeal swab, presence of viral mutation(s) within the areas targeted by this assay, and inadequate number of viral copies (<131 copies/mL). A negative result must be combined with clinical observations, patient history, and epidemiological information. The expected result is Negative.  Fact Sheet for Patients:  PinkCheek.be  Fact Sheet for Healthcare Providers:  GravelBags.it  This test is no t yet approved or cleared by the [place] FDA and  has been authorized for detection and/or diagnosis of SARS-CoV-2 by FDA under an Emergency Use Authorization (EUA). This EUA will remain  in effect (meaning this test can be used) for the duration of the COVID-19 declaration under Section 564(b)(1) of the Act, 21 U.S.C. section 360bbb-3(b)(1), unless the authorization is terminated or revoked sooner.     Influenza A by PCR NEGATIVE NEGATIVE Final   Influenza B by PCR NEGATIVE NEGATIVE Final    Comment: (NOTE) The Xpert Xpress SARS-CoV-2/FLU/RSV assay is intended as an aid in  the diagnosis of influenza from Nasopharyngeal swab specimens and  should not be used as a sole basis for treatment. Nasal washings and  aspirates are unacceptable for Xpert Xpress SARS-CoV-2/FLU/RSV   testing.  Fact Sheet for Patients: PinkCheek.be  Fact Sheet for Healthcare Providers: GravelBags.it  This test is not yet approved or cleared by the [place] FDA and  has been authorized for detection and/or diagnosis of SARS-CoV-2 by  FDA under an Emergency Use Authorization (EUA). This EUA will remain  in effect (meaning this test can be used) for the duration of the  Covid-19 declaration under Section 564(b)(1) of the Act, 21  U.S.C. section 360bbb-3(b)(1), unless the authorization is  terminated or revoked. Performed at [facility], [address] [place], [place] [address]   Culture, blood (routine x 2)     Status: None   Collection Time: 07/21/20 11:52 AM   Specimen: BLOOD  Result Value Ref  Range Status   Specimen Description BLOOD SITE NOT SPECIFIED DRAWN BY RN  Final   Special Requests   Final    BOTTLES DRAWN AEROBIC AND ANAEROBIC Blood Culture adequate volume   Culture   Final    NO GROWTH 5 DAYS Performed at [facility], [address], [place], [place] [address]    Report Status 07/26/2020 FINAL  Final  Culture, blood (routine x 2)     Status: None   Collection Time: 07/21/20 12:41 PM   Specimen: BLOOD  Result Value Ref Range Status   Specimen Description BLOOD SITE NOT SPECIFIED  Final   Special Requests   Final    BOTTLES DRAWN AEROBIC AND ANAEROBIC Blood Culture results may not be optimal due to an inadequate volume of blood received in culture bottles   Culture   Final    NO GROWTH 5 DAYS Performed at [facility], [address] [place], [place] [address]    Report Status 07/26/2020 FINAL  Final  Culture, respiratory (non-expectorated)     Status: None   Collection Time: 07/21/20  3:11 PM   Specimen: Tracheal Aspirate; Respiratory  Result Value Ref Range Status   Specimen Description   Final    TRACHEAL ASPIRATE Performed at [facility], [address], [place], [place] [address]     Special Requests   Final    NONE Performed at [facility], [address] [place], [place] [address]    Gram Stain   Final    RARE WBC PRESENT,BOTH PMN AND MONONUCLEAR RARE GRAM POSITIVE RODS RARE GRAM NEGATIVE RODS Performed at [facility], [place] [address] [place], [place] [address]    Culture RARE PSEUDOMONAS AERUGINOSA  Final   Report Status 07/24/2020 FINAL  Final   Organism ID, Bacteria PSEUDOMONAS AERUGINOSA  Final      Susceptibility   Pseudomonas aeruginosa - MIC*    CEFTAZIDIME 4 SENSITIVE Sensitive     CIPROFLOXACIN <=0.25 SENSITIVE Sensitive     GENTAMICIN <=1 SENSITIVE Sensitive     IMIPENEM 1 SENSITIVE Sensitive     PIP/TAZO 8 SENSITIVE Sensitive     CEFEPIME 2 SENSITIVE Sensitive     * RARE PSEUDOMONAS AERUGINOSA     Time coordinating discharge: 35 minutes  SIGNED:   Rodena Goldmann, DO [facility] 07/29/2020, 10:04 AM  If 7PM-7AM, please contact night-coverage [email or web address]

## 2020-07-29 NOTE — Progress Notes (Signed)
Inpatient Diabetes Program Recommendations  AACE/ADA: New Consensus Statement on Inpatient Glycemic Control (2015)  Target Ranges:  Prepandial:   less than 140 mg/dL      Peak postprandial:   less than 180 mg/dL (1-2 hours)      Critically ill patients:  140 - 180 mg/dL   Lab Results  Component Value Date   GLUCAP 86 07/29/2020   HGBA1C 8.9 (H) 06/16/2020    Review of Glycemic Control Results for HADDY, MULLINAX (MRN [id number]) as of 07/29/2020 09:55  Ref. Range 07/28/2020 07:48 07/28/2020 11:04 07/28/2020 16:18 07/28/2020 20:46 07/29/2020 08:02  Glucose-Capillary Latest Ref Range: 70 - 99 mg/dL 101 (H) 326 (H) 380 (H) 202 (H) 86    Inpatient Diabetes Program Recommendations:     Lantus 30 units qhs  Novolog 8 units tid with meals if eats at least 50% of meal  Will continue to follow while inpatient.  Thank you, Reche Dixon, RN, BSN Diabetes Coordinator Inpatient Diabetes Program [phone] (team pager from 8a-5p)

## 2020-07-29 NOTE — Progress Notes (Signed)
Went to do assessment on patient.  Patient was resting comfortably, no distress noted, but patient still had CAP in place.  Patient is able to cough, but not getting anything up, and with CAP in place, it will be hard for patient to remove secretions; patient is aware of this.

## 2020-07-30 ENCOUNTER — Telehealth: Payer: Self-pay | Admitting: Cardiology

## 2020-07-30 DIAGNOSIS — I5043 Acute on chronic combined systolic (congestive) and diastolic (congestive) heart failure: Secondary | ICD-10-CM | POA: Diagnosis not present

## 2020-07-30 DIAGNOSIS — J9621 Acute and chronic respiratory failure with hypoxia: Secondary | ICD-10-CM | POA: Diagnosis not present

## 2020-07-30 DIAGNOSIS — I2510 Atherosclerotic heart disease of native coronary artery without angina pectoris: Secondary | ICD-10-CM | POA: Diagnosis not present

## 2020-07-30 DIAGNOSIS — E1165 Type 2 diabetes mellitus with hyperglycemia: Secondary | ICD-10-CM | POA: Diagnosis not present

## 2020-07-30 DIAGNOSIS — J441 Chronic obstructive pulmonary disease with (acute) exacerbation: Secondary | ICD-10-CM | POA: Diagnosis not present

## 2020-07-30 DIAGNOSIS — I110 Hypertensive heart disease with heart failure: Secondary | ICD-10-CM | POA: Diagnosis not present

## 2020-07-30 NOTE — Telephone Encounter (Signed)
Call sent directly into triage. Spoke with Arbie Cookey, RN who is at the pt's home doing a visit per protocol after recent discharge from [facility]. The RN states that the pt's heart rate is varying from as low as 45 to as high at 144 BPM. Additionally, the pt is complaining of feeling extremely weak, with "jelly legs." RN states the pt has fallen at least twice since coming home last night. When asked for the pt's blood pressure, the RN states that is is too faint to get a solid reading, but thinks the systolic is around 975. The nurse states that the pt had all of her cardiac medications adjusted at the hospital last night. The RN requested that our office contact the hospital to arrange for the pt to be direct admitted, but was advised that that is not our protocol and that the pt needs to go right to the ER. The RN has conveyed our recommendation to the pt, but states that the pt is resistant to going to the ER and may or may not decide to go. The RN is leaving the pt's home to go see another client, but states that the pt has family members present to transport and assist her should the pt consent to going to the ER.

## 2020-07-30 NOTE — Telephone Encounter (Signed)
STAT if HR is under 50 or over 120 (normal HR is 60-100 beats per minute)  1) What is your heart rate? 45  2) Do you have a log of your heart rate readings (document readings)? Yes   3) Do you have any other symptoms? Fluctuating HR from 45 to 144; extremely weak; getting worse since yesterday

## 2020-07-31 DIAGNOSIS — Z9981 Dependence on supplemental oxygen: Secondary | ICD-10-CM | POA: Diagnosis not present

## 2020-07-31 DIAGNOSIS — R5381 Other malaise: Secondary | ICD-10-CM | POA: Diagnosis not present

## 2020-07-31 DIAGNOSIS — J189 Pneumonia, unspecified organism: Secondary | ICD-10-CM | POA: Diagnosis not present

## 2020-07-31 DIAGNOSIS — R9431 Abnormal electrocardiogram [ECG] [EKG]: Secondary | ICD-10-CM | POA: Diagnosis not present

## 2020-07-31 DIAGNOSIS — R059 Cough, unspecified: Secondary | ICD-10-CM | POA: Diagnosis not present

## 2020-07-31 DIAGNOSIS — N189 Chronic kidney disease, unspecified: Secondary | ICD-10-CM | POA: Diagnosis not present

## 2020-07-31 DIAGNOSIS — Z20822 Contact with and (suspected) exposure to covid-19: Secondary | ICD-10-CM | POA: Diagnosis not present

## 2020-07-31 DIAGNOSIS — R918 Other nonspecific abnormal finding of lung field: Secondary | ICD-10-CM | POA: Diagnosis not present

## 2020-07-31 DIAGNOSIS — R4182 Altered mental status, unspecified: Secondary | ICD-10-CM | POA: Diagnosis not present

## 2020-07-31 DIAGNOSIS — R55 Syncope and collapse: Secondary | ICD-10-CM | POA: Diagnosis not present

## 2020-07-31 DIAGNOSIS — I5032 Chronic diastolic (congestive) heart failure: Secondary | ICD-10-CM | POA: Diagnosis not present

## 2020-07-31 DIAGNOSIS — J449 Chronic obstructive pulmonary disease, unspecified: Secondary | ICD-10-CM | POA: Diagnosis not present

## 2020-07-31 DIAGNOSIS — E119 Type 2 diabetes mellitus without complications: Secondary | ICD-10-CM | POA: Diagnosis not present

## 2020-07-31 DIAGNOSIS — I509 Heart failure, unspecified: Secondary | ICD-10-CM | POA: Diagnosis not present

## 2020-07-31 DIAGNOSIS — I110 Hypertensive heart disease with heart failure: Secondary | ICD-10-CM | POA: Diagnosis not present

## 2020-07-31 DIAGNOSIS — F17210 Nicotine dependence, cigarettes, uncomplicated: Secondary | ICD-10-CM | POA: Diagnosis not present

## 2020-07-31 DIAGNOSIS — Z7901 Long term (current) use of anticoagulants: Secondary | ICD-10-CM | POA: Diagnosis not present

## 2020-07-31 DIAGNOSIS — I4892 Unspecified atrial flutter: Secondary | ICD-10-CM | POA: Diagnosis not present

## 2020-07-31 DIAGNOSIS — R000 Tachycardia, unspecified: Secondary | ICD-10-CM | POA: Diagnosis not present

## 2020-07-31 DIAGNOSIS — R42 Dizziness and giddiness: Secondary | ICD-10-CM | POA: Diagnosis not present

## 2020-07-31 DIAGNOSIS — I484 Atypical atrial flutter: Secondary | ICD-10-CM | POA: Diagnosis not present

## 2020-08-01 ENCOUNTER — Inpatient Hospital Stay (HOSPITAL_COMMUNITY)
Admission: AD | Admit: 2020-08-01 | Discharge: 2020-08-01 | Disposition: A | Discharge status: Home / Self Care | Payer: Medicare Other | Source: Ambulatory Visit | Attending: Internal Medicine | Admitting: Internal Medicine

## 2020-08-01 ENCOUNTER — Inpatient Hospital Stay (HOSPITAL_COMMUNITY)
Admission: AD | Admit: 2020-08-01 | Discharge: 2020-08-07 | DRG: 190 | Disposition: A | Discharge status: Skilled Nursing Facility | Payer: Medicare Other | Attending: Internal Medicine | Admitting: Internal Medicine

## 2020-08-01 ENCOUNTER — Inpatient Hospital Stay (HOSPITAL_COMMUNITY): Payer: Medicare Other

## 2020-08-01 DIAGNOSIS — Z20822 Contact with and (suspected) exposure to covid-19: Secondary | ICD-10-CM | POA: Diagnosis not present

## 2020-08-01 DIAGNOSIS — E1122 Type 2 diabetes mellitus with diabetic chronic kidney disease: Secondary | ICD-10-CM | POA: Diagnosis present

## 2020-08-01 DIAGNOSIS — D6862 Lupus anticoagulant syndrome: Secondary | ICD-10-CM | POA: Diagnosis not present

## 2020-08-01 DIAGNOSIS — J9621 Acute and chronic respiratory failure with hypoxia: Secondary | ICD-10-CM | POA: Diagnosis present

## 2020-08-01 DIAGNOSIS — R42 Dizziness and giddiness: Secondary | ICD-10-CM | POA: Diagnosis not present

## 2020-08-01 DIAGNOSIS — F32A Depression, unspecified: Secondary | ICD-10-CM | POA: Diagnosis present

## 2020-08-01 DIAGNOSIS — T380X5A Adverse effect of glucocorticoids and synthetic analogues, initial encounter: Secondary | ICD-10-CM | POA: Diagnosis not present

## 2020-08-01 DIAGNOSIS — Z7401 Bed confinement status: Secondary | ICD-10-CM | POA: Diagnosis not present

## 2020-08-01 DIAGNOSIS — N1832 Chronic kidney disease, stage 3b: Secondary | ICD-10-CM | POA: Diagnosis present

## 2020-08-01 DIAGNOSIS — E1165 Type 2 diabetes mellitus with hyperglycemia: Secondary | ICD-10-CM | POA: Diagnosis present

## 2020-08-01 DIAGNOSIS — T17990A Other foreign object in respiratory tract, part unspecified in causing asphyxiation, initial encounter: Secondary | ICD-10-CM | POA: Diagnosis present

## 2020-08-01 DIAGNOSIS — B952 Enterococcus as the cause of diseases classified elsewhere: Secondary | ICD-10-CM | POA: Diagnosis present

## 2020-08-01 DIAGNOSIS — X58XXXA Exposure to other specified factors, initial encounter: Secondary | ICD-10-CM | POA: Diagnosis present

## 2020-08-01 DIAGNOSIS — N3289 Other specified disorders of bladder: Secondary | ICD-10-CM | POA: Diagnosis present

## 2020-08-01 DIAGNOSIS — T502X5A Adverse effect of carbonic-anhydrase inhibitors, benzothiadiazides and other diuretics, initial encounter: Secondary | ICD-10-CM | POA: Diagnosis present

## 2020-08-01 DIAGNOSIS — I5042 Chronic combined systolic (congestive) and diastolic (congestive) heart failure: Secondary | ICD-10-CM | POA: Diagnosis not present

## 2020-08-01 DIAGNOSIS — I130 Hypertensive heart and chronic kidney disease with heart failure and stage 1 through stage 4 chronic kidney disease, or unspecified chronic kidney disease: Secondary | ICD-10-CM | POA: Diagnosis not present

## 2020-08-01 DIAGNOSIS — R059 Cough, unspecified: Secondary | ICD-10-CM | POA: Diagnosis not present

## 2020-08-01 DIAGNOSIS — R55 Syncope and collapse: Secondary | ICD-10-CM | POA: Diagnosis not present

## 2020-08-01 DIAGNOSIS — M6389 Disorders of muscle in diseases classified elsewhere, multiple sites: Secondary | ICD-10-CM | POA: Diagnosis present

## 2020-08-01 DIAGNOSIS — K5641 Fecal impaction: Secondary | ICD-10-CM | POA: Diagnosis present

## 2020-08-01 DIAGNOSIS — K7460 Unspecified cirrhosis of liver: Secondary | ICD-10-CM | POA: Diagnosis present

## 2020-08-01 DIAGNOSIS — Z794 Long term (current) use of insulin: Secondary | ICD-10-CM

## 2020-08-01 DIAGNOSIS — F17210 Nicotine dependence, cigarettes, uncomplicated: Secondary | ICD-10-CM | POA: Diagnosis present

## 2020-08-01 DIAGNOSIS — K625 Hemorrhage of anus and rectum: Secondary | ICD-10-CM

## 2020-08-01 DIAGNOSIS — R0902 Hypoxemia: Secondary | ICD-10-CM | POA: Diagnosis not present

## 2020-08-01 DIAGNOSIS — Z930 Tracheostomy status: Secondary | ICD-10-CM

## 2020-08-01 DIAGNOSIS — I25810 Atherosclerosis of coronary artery bypass graft(s) without angina pectoris: Secondary | ICD-10-CM | POA: Diagnosis not present

## 2020-08-01 DIAGNOSIS — I2510 Atherosclerotic heart disease of native coronary artery without angina pectoris: Secondary | ICD-10-CM | POA: Diagnosis present

## 2020-08-01 DIAGNOSIS — Z888 Allergy status to other drugs, medicaments and biological substances status: Secondary | ICD-10-CM

## 2020-08-01 DIAGNOSIS — E871 Hypo-osmolality and hyponatremia: Secondary | ICD-10-CM | POA: Diagnosis not present

## 2020-08-01 DIAGNOSIS — K5900 Constipation, unspecified: Secondary | ICD-10-CM

## 2020-08-01 DIAGNOSIS — I5032 Chronic diastolic (congestive) heart failure: Secondary | ICD-10-CM | POA: Diagnosis not present

## 2020-08-01 DIAGNOSIS — J90 Pleural effusion, not elsewhere classified: Secondary | ICD-10-CM | POA: Diagnosis not present

## 2020-08-01 DIAGNOSIS — I484 Atypical atrial flutter: Secondary | ICD-10-CM | POA: Diagnosis not present

## 2020-08-01 DIAGNOSIS — N390 Urinary tract infection, site not specified: Secondary | ICD-10-CM | POA: Diagnosis not present

## 2020-08-01 DIAGNOSIS — T68XXXA Hypothermia, initial encounter: Secondary | ICD-10-CM | POA: Diagnosis not present

## 2020-08-01 DIAGNOSIS — I4892 Unspecified atrial flutter: Secondary | ICD-10-CM | POA: Diagnosis present

## 2020-08-01 DIAGNOSIS — Z720 Tobacco use: Secondary | ICD-10-CM | POA: Diagnosis not present

## 2020-08-01 DIAGNOSIS — N1831 Chronic kidney disease, stage 3a: Secondary | ICD-10-CM

## 2020-08-01 DIAGNOSIS — E119 Type 2 diabetes mellitus without complications: Secondary | ICD-10-CM | POA: Diagnosis present

## 2020-08-01 DIAGNOSIS — N179 Acute kidney failure, unspecified: Secondary | ICD-10-CM

## 2020-08-01 DIAGNOSIS — I110 Hypertensive heart disease with heart failure: Secondary | ICD-10-CM | POA: Diagnosis not present

## 2020-08-01 DIAGNOSIS — R10819 Abdominal tenderness, unspecified site: Secondary | ICD-10-CM | POA: Diagnosis not present

## 2020-08-01 DIAGNOSIS — Z8249 Family history of ischemic heart disease and other diseases of the circulatory system: Secondary | ICD-10-CM

## 2020-08-01 DIAGNOSIS — Z79899 Other long term (current) drug therapy: Secondary | ICD-10-CM

## 2020-08-01 DIAGNOSIS — Z8541 Personal history of malignant neoplasm of cervix uteri: Secondary | ICD-10-CM

## 2020-08-01 DIAGNOSIS — Z880 Allergy status to penicillin: Secondary | ICD-10-CM

## 2020-08-01 DIAGNOSIS — R0602 Shortness of breath: Secondary | ICD-10-CM | POA: Diagnosis not present

## 2020-08-01 DIAGNOSIS — K921 Melena: Secondary | ICD-10-CM | POA: Diagnosis present

## 2020-08-01 DIAGNOSIS — Z452 Encounter for adjustment and management of vascular access device: Secondary | ICD-10-CM

## 2020-08-01 DIAGNOSIS — J441 Chronic obstructive pulmonary disease with (acute) exacerbation: Principal | ICD-10-CM | POA: Diagnosis present

## 2020-08-01 DIAGNOSIS — E785 Hyperlipidemia, unspecified: Secondary | ICD-10-CM | POA: Diagnosis present

## 2020-08-01 DIAGNOSIS — F419 Anxiety disorder, unspecified: Secondary | ICD-10-CM | POA: Diagnosis present

## 2020-08-01 DIAGNOSIS — R2681 Unsteadiness on feet: Secondary | ICD-10-CM | POA: Diagnosis present

## 2020-08-01 DIAGNOSIS — R262 Difficulty in walking, not elsewhere classified: Secondary | ICD-10-CM | POA: Diagnosis present

## 2020-08-01 DIAGNOSIS — Z8674 Personal history of sudden cardiac arrest: Secondary | ICD-10-CM

## 2020-08-01 DIAGNOSIS — R279 Unspecified lack of coordination: Secondary | ICD-10-CM | POA: Diagnosis not present

## 2020-08-01 DIAGNOSIS — J9811 Atelectasis: Secondary | ICD-10-CM | POA: Diagnosis not present

## 2020-08-01 DIAGNOSIS — J449 Chronic obstructive pulmonary disease, unspecified: Secondary | ICD-10-CM | POA: Diagnosis not present

## 2020-08-01 DIAGNOSIS — Z7901 Long term (current) use of anticoagulants: Secondary | ICD-10-CM

## 2020-08-01 DIAGNOSIS — R000 Tachycardia, unspecified: Secondary | ICD-10-CM | POA: Diagnosis not present

## 2020-08-01 LAB — FOLATE: Folate: 13 ng/mL (ref >5.9)

## 2020-08-01 LAB — CBC
HCT: 48.9 % — ABNORMAL HIGH (ref 36.0–46.0)
Hemoglobin: 15.9 g/dL — ABNORMAL HIGH (ref 12.0–15.0)
MCH: 31.4 pg (ref 26.0–34.0)
MCHC: 32.5 g/dL (ref 30.0–36.0)
MCV: 96.6 fL (ref 80.0–100.0)
Platelets: 245 10*3/uL (ref 150–400)
RBC: 5.06 MIL/uL (ref 3.87–5.11)
RDW: 15.5 % (ref 11.5–15.5)
WBC: 20 10*3/uL — ABNORMAL HIGH (ref 4.0–10.5)
nRBC: 0 % (ref 0.0–0.2)

## 2020-08-01 LAB — COMPREHENSIVE METABOLIC PANEL
ALT: 27 U/L (ref 0–44)
AST: 22 U/L (ref 15–41)
Albumin: 3.6 g/dL (ref 3.5–5.0)
Alkaline Phosphatase: 91 U/L (ref 38–126)
Anion gap: 15 (ref 5–15)
BUN: 85 mg/dL — ABNORMAL HIGH (ref 6–20)
CO2: 40 mmol/L — ABNORMAL HIGH (ref 22–32)
Calcium: 8.7 mg/dL — ABNORMAL LOW (ref 8.9–10.3)
Chloride: 78 mmol/L — ABNORMAL LOW (ref 98–111)
Creatinine, Ser: 1.47 mg/dL — ABNORMAL HIGH (ref 0.44–1.00)
GFR, Estimated: 42 mL/min — ABNORMAL LOW (ref >60)
Glucose, Bld: 76 mg/dL (ref 70–99)
Potassium: 3.7 mmol/L (ref 3.5–5.1)
Sodium: 133 mmol/L — ABNORMAL LOW (ref 135–145)
Total Bilirubin: 1.6 mg/dL — ABNORMAL HIGH (ref 0.3–1.2)
Total Protein: 7.1 g/dL (ref 6.5–8.1)

## 2020-08-01 LAB — URINALYSIS, COMPLETE (UACMP) WITH MICROSCOPIC
Bacteria, UA: NONE SEEN
Bilirubin Urine: NEGATIVE
Glucose, UA: NEGATIVE mg/dL
Ketones, ur: NEGATIVE mg/dL
Leukocytes,Ua: NEGATIVE
Nitrite: NEGATIVE
Protein, ur: 30 mg/dL — AB
Specific Gravity, Urine: 1.012 (ref 1.005–1.030)
pH: 6 (ref 5.0–8.0)

## 2020-08-01 LAB — VITAMIN B12: Vitamin B-12: 655 pg/mL (ref 180–914)

## 2020-08-01 LAB — BRAIN NATRIURETIC PEPTIDE: B Natriuretic Peptide: 344 pg/mL — ABNORMAL HIGH (ref 0.0–100.0)

## 2020-08-01 LAB — BLOOD GAS, ARTERIAL
Acid-Base Excess: 19.2 mmol/L — ABNORMAL HIGH (ref 0.0–2.0)
Bicarbonate: 42 mmol/L — ABNORMAL HIGH (ref 20.0–28.0)
FIO2: 80
O2 Saturation: 92.7 %
Patient temperature: 37
pCO2 arterial: 50.2 mmHg — ABNORMAL HIGH (ref 32.0–48.0)
pH, Arterial: 7.55 — ABNORMAL HIGH (ref 7.350–7.450)
pO2, Arterial: 65.2 mmHg — ABNORMAL LOW (ref 83.0–108.0)

## 2020-08-01 LAB — GLUCOSE, CAPILLARY
Glucose-Capillary: 87 mg/dL (ref 70–99)
Glucose-Capillary: 104 mg/dL — ABNORMAL HIGH (ref 70–99)
Glucose-Capillary: 102 mg/dL — ABNORMAL HIGH (ref 70–99)

## 2020-08-01 LAB — RESP PANEL BY RT-PCR (FLU A&B, COVID) ARPGX2
Influenza A by PCR: NEGATIVE
Influenza B by PCR: NEGATIVE
SARS Coronavirus 2 by RT PCR: NEGATIVE

## 2020-08-01 LAB — PROTIME-INR
INR: 2.7 — ABNORMAL HIGH (ref 0.8–1.2)
Prothrombin Time: 27.9 seconds — ABNORMAL HIGH (ref 11.4–15.2)

## 2020-08-01 LAB — MRSA PCR SCREENING: MRSA by PCR: NEGATIVE

## 2020-08-01 LAB — T4, FREE: Free T4: 1.29 ng/dL — ABNORMAL HIGH (ref 0.61–1.12)

## 2020-08-01 LAB — HEMOGLOBIN A1C
Hgb A1c MFr Bld: 8.2 % — ABNORMAL HIGH (ref 4.8–5.6)
Mean Plasma Glucose: 188.64 mg/dL

## 2020-08-01 LAB — TSH: TSH: 3.75 u[IU]/mL (ref 0.350–4.500)

## 2020-08-01 LAB — PROCALCITONIN: Procalcitonin: <0.1 ng/mL

## 2020-08-01 MED ORDER — TAMSULOSIN HCL 0.4 MG PO CAPS
0.4000 mg | ORAL_CAPSULE | Freq: Every day | ORAL | Status: DC
  Administered 2020-08-01 – 2020-08-07 (×7): 0.4 mg via ORAL
  Filled 2020-08-01 (×7): qty 1

## 2020-08-01 MED ORDER — IPRATROPIUM-ALBUTEROL 0.5-2.5 (3) MG/3ML IN SOLN
3.0000 mL | Freq: Four times a day (QID) | RESPIRATORY_TRACT | Status: DC
  Administered 2020-08-01 – 2020-08-07 (×24): 3 mL via RESPIRATORY_TRACT
  Filled 2020-08-01 (×25): qty 3

## 2020-08-01 MED ORDER — PANTOPRAZOLE SODIUM 40 MG PO TBEC
40.0000 mg | DELAYED_RELEASE_TABLET | Freq: Every day | ORAL | Status: DC
  Administered 2020-08-01 – 2020-08-07 (×7): 40 mg via ORAL
  Filled 2020-08-01 (×7): qty 1

## 2020-08-01 MED ORDER — WARFARIN - PHARMACIST DOSING INPATIENT: Freq: Every day | Status: DC

## 2020-08-01 MED ORDER — ACETAMINOPHEN 650 MG RE SUPP
650.0000 mg | Freq: Four times a day (QID) | RECTAL | Status: DC | PRN
  Administered 2020-08-03: 650 mg via RECTAL
  Filled 2020-08-01: qty 1

## 2020-08-01 MED ORDER — POLYETHYLENE GLYCOL 3350 17 G PO PACK
17.0000 g | PACK | Freq: Every day | ORAL | Status: DC | PRN
  Administered 2020-08-01: 17 g via ORAL
  Filled 2020-08-01 (×2): qty 1

## 2020-08-01 MED ORDER — ONDANSETRON HCL 4 MG/2ML IJ SOLN: 4.0000 mg | Freq: Four times a day (QID) | INTRAMUSCULAR | Status: DC | PRN

## 2020-08-01 MED ORDER — INSULIN ASPART 100 UNIT/ML ~~LOC~~ SOLN
0.0000 [IU] | Freq: Every day | SUBCUTANEOUS | Status: DC
  Administered 2020-08-04: 2 [IU] via SUBCUTANEOUS

## 2020-08-01 MED ORDER — ACETAMINOPHEN 325 MG PO TABS
650.0000 mg | ORAL_TABLET | Freq: Four times a day (QID) | ORAL | Status: DC | PRN
  Administered 2020-08-04 – 2020-08-05 (×2): 650 mg via ORAL
  Filled 2020-08-01 (×4): qty 2

## 2020-08-01 MED ORDER — PANTOPRAZOLE SODIUM 20 MG PO TBEC
20.0000 mg | DELAYED_RELEASE_TABLET | Freq: Every day | ORAL | Status: DC
  Filled 2020-08-01 (×4): qty 1

## 2020-08-01 MED ORDER — WARFARIN SODIUM 2.5 MG PO TABS
2.5000 mg | ORAL_TABLET | Freq: Once | ORAL | Status: AC
  Administered 2020-08-01: 2.5 mg via ORAL
  Filled 2020-08-01: qty 1

## 2020-08-01 MED ORDER — AMIODARONE HCL 200 MG PO TABS
200.0000 mg | ORAL_TABLET | Freq: Every day | ORAL | Status: DC
  Administered 2020-08-01 – 2020-08-07 (×7): 200 mg via ORAL
  Filled 2020-08-01 (×7): qty 1

## 2020-08-01 MED ORDER — SERTRALINE HCL 50 MG PO TABS
50.0000 mg | ORAL_TABLET | Freq: Every day | ORAL | Status: DC
  Administered 2020-08-02 – 2020-08-06 (×6): 50 mg via ORAL
  Filled 2020-08-01 (×6): qty 1

## 2020-08-01 MED ORDER — INSULIN ASPART 100 UNIT/ML ~~LOC~~ SOLN
0.0000 [IU] | Freq: Three times a day (TID) | SUBCUTANEOUS | Status: DC
  Administered 2020-08-02 (×3): 5 [IU] via SUBCUTANEOUS
  Administered 2020-08-03: 11 [IU] via SUBCUTANEOUS
  Administered 2020-08-03 – 2020-08-04 (×3): 15 [IU] via SUBCUTANEOUS
  Administered 2020-08-06 (×2): 3 [IU] via SUBCUTANEOUS
  Administered 2020-08-06: 5 [IU] via SUBCUTANEOUS
  Administered 2020-08-07: 2 [IU] via SUBCUTANEOUS

## 2020-08-01 MED ORDER — METHYLPREDNISOLONE SODIUM SUCC 125 MG IJ SOLR
60.0000 mg | Freq: Two times a day (BID) | INTRAMUSCULAR | Status: DC
  Administered 2020-08-01 – 2020-08-02 (×2): 60 mg via INTRAVENOUS
  Filled 2020-08-01 (×2): qty 2

## 2020-08-01 MED ORDER — BUDESONIDE 0.5 MG/2ML IN SUSP
0.5000 mg | Freq: Two times a day (BID) | RESPIRATORY_TRACT | Status: DC
  Administered 2020-08-01 – 2020-08-07 (×13): 0.5 mg via RESPIRATORY_TRACT
  Filled 2020-08-01 (×14): qty 2

## 2020-08-01 MED ORDER — ONDANSETRON HCL 4 MG PO TABS
4.0000 mg | ORAL_TABLET | Freq: Four times a day (QID) | ORAL | Status: DC | PRN
  Administered 2020-08-02 – 2020-08-07 (×2): 4 mg via ORAL
  Filled 2020-08-01 (×2): qty 1

## 2020-08-01 NOTE — Evaluation (Signed)
Physical Therapy Evaluation Patient Details Name: Theresa [name] MRN: [id number] DOB: 02/27/1966 Today's Date: 08/01/2020   History of Present Illness  Theresa [name] is a 54 year old female with systolic and diastolic CHF, COPD, continued tobacco abuse, diabetes mellitus type 2, lupus anticoagulant syndrome, coronary artery disease with history of STEMI, hypertension, chronic respiratory failure on 4 L, chronic tracheostomy presenting with 2 day history of dizziness, worsening generalized weakness, and near syncope.  Notably, the patient was recently admitted to the hospital from 07/21/2020 to 07/29/2020 when she was treated for acute on chronic respiratory failure secondary to decompensated CHF.  The patient was discharged home with Bumex 6 mg in the morning and 4 mg in the evening.  It appeared that her discharge weight was 93.3 kg (205.7 lbs).  The patient endorsed compliance with her Bumex at home.  However she continued to have poor oral intake.  She stated that she has had episodes of dizziness and generalized weakness requiring increasing help from her family.  She was try to get up off the commode and was so weak she needed help.  Her family was not able to help her so EMS was activated.  Since discharge from the hospital, the patient denies any worsening shortness of breath.  She denied any fevers, chills, chest pain, hemoptysis, nausea, vomiting, diarrhea, abdominal pain, headache, visual disturbance, focal extremity weakness.  She denies any dysuria, hematuria, hematochezia, melena.  She states that she has had episodes of near syncope where she describes the her visual field becomes black for short period of time.  This can usually happen even when she is at rest watching television.  EMS was activated, and the patient was taken to [facility].  BMP showed a sodium 133, potassium 4.1, chloride 87, CO2 41, BUN 86, creatinine 1.76.  WBC 19.6, hemoglobin 16.3, platelets 262,000.  Troponin 73>> 77.  LFTs  were unremarkable.  Magnesium was 2.9.  Chest x-ray showed bilateral patchy infiltrates.  The patient requested transfer to [facility] for further evaluation and treatment.    Clinical Impression  Patient limited for functional mobility as stated below secondary to BLE weakness, fatigue and impaired standing balance. Patient with c/o buttock pain throughout session. Patient transfers to seated EOB requiring assist to pull to seated. Patient demonstrates good sitting balance EOB but is limited by buttock pain which states may be hemorrhoids. Patient transfers to standing with min/mod assist secondary to weakness with use of RW and is unsteady initially upon standing. Patient demonstrates good standing tolerance today at edge of bed. She takes a few lateral steps at bedside initially but is agreeable to ambulate further in room due to decrease in pain with not weightbearing on buttock. Patient ambulates with slow, labored cadence with RW. Patient returned to bed at end of session. RN returned at end of session to examine patient for c/o buttock pain.  Patient will benefit from continued physical therapy in hospital and recommended venue below to increase strength, balance, endurance for safe ADLs and gait.     Follow Up Recommendations SNF    Equipment Recommendations  None recommended by PT    Recommendations for Other Services       Precautions / Restrictions Precautions Precautions: Fall Restrictions Weight Bearing Restrictions: No      Mobility  Bed Mobility   Bed Mobility: Supine to Sit;Sit to Supine     Supine to sit: Min assist Sit to supine: Min guard   General bed mobility comments: slow, labored,  pull to seated EOB    Transfers Overall transfer level: Needs assistance Equipment used: Rolling walker (2 wheeled) Transfers: Sit to/from [facility] Sit to Stand: Min assist;Mod assist Stand pivot transfers: Min assist;Mod assist       General  transfer comment: transfer to standing with RW, requires assist secondary to weakness  Ambulation/Gait Ambulation/Gait assistance: Min assist Gait Distance (Feet): 10 Feet Assistive device: Rolling walker (2 wheeled) Gait Pattern/deviations: Decreased step length - right;Decreased step length - left;Decreased stride length Gait velocity: decreased   General Gait Details: Patient takes few lateral steps at bedside initially, agreeable to ambulate in room with slow, labored cadence with RW  Stairs            Wheelchair Mobility    Modified Rankin (Stroke Patients Only)       Balance Overall balance assessment: Needs assistance Sitting-balance support: Feet supported;No upper extremity supported Sitting balance-Leahy Scale: Good Sitting balance - Comments: seated at EOB   Standing balance support: During functional activity;Bilateral upper extremity supported Standing balance-Leahy Scale: Fair Standing balance comment: fair/good using RW                             Pertinent Vitals/Pain Pain Assessment: Faces Faces Pain Scale: Hurts even more Pain Location: buttock Pain Intervention(s): Limited activity within patient's tolerance;Monitored during session;Repositioned    Home Living Family/patient expects to be discharged to:: Private residence Living Arrangements: Spouse/significant other Available Help at Discharge: Family;Available 24 hours/day Type of Home: House Home Access: Stairs to enter Entrance Stairs-Rails: None Entrance Stairs-Number of Steps: 2-3 Home Layout: One level Home Equipment: Walker - 2 wheels;Cane - single point;Bedside commode;Wheelchair - manual;Tub bench;Shower seat      Prior Function Level of Independence: Needs assistance   Gait / Transfers Assistance Needed: Household ambulator leaning on walls and nearby objects  ADL's / Homemaking Assistance Needed: assisted by family        Hand Dominance         Extremity/Trunk Assessment   Upper Extremity Assessment Upper Extremity Assessment: Generalized weakness    Lower Extremity Assessment Lower Extremity Assessment: Generalized weakness    Cervical / Trunk Assessment Cervical / Trunk Assessment: Normal  Communication   Communication: No difficulties  Cognition Arousal/Alertness: Awake/alert Behavior During Therapy: WFL for tasks assessed/performed Overall Cognitive Status: Within Functional Limits for tasks assessed                                        General Comments      Exercises     Assessment/Plan    PT Assessment Patient needs continued PT services  PT Problem List Decreased strength;Decreased activity tolerance;Decreased balance;Decreased mobility;Pain       PT Treatment Interventions Gait training;Stair training;Functional mobility training;DME instruction;Therapeutic activities;Therapeutic exercise;Patient/family education;Balance training;Neuromuscular re-education    PT Goals (Current goals can be found in the Care Plan section)  Acute Rehab PT Goals Patient Stated Goal: go to rehab and get stronger and return home PT Goal Formulation: With patient Time For Goal Achievement: 08/13/20 Potential to Achieve Goals: Good    Frequency Min 3X/week   Barriers to discharge        Co-evaluation               AM-PAC PT "6 Clicks" Mobility  Outcome Measure Help needed turning from your back to  your side while in a flat bed without using bedrails?: None Help needed moving from lying on your back to sitting on the side of a flat bed without using bedrails?: A Little Help needed moving to and from a bed to a chair (including a wheelchair)?: A Little Help needed standing up from a chair using your arms (e.g., wheelchair or bedside chair)?: A Little Help needed to walk in hospital room?: A Little Help needed climbing 3-5 steps with a railing? : A Lot 6 Click Score: 18    End of Session  Equipment Utilized During Treatment: Oxygen;Gait belt Activity Tolerance: Patient tolerated treatment well;Patient limited by fatigue Patient left: with call bell/phone within reach;with family/visitor present;in bed Nurse Communication: Mobility status PT Visit Diagnosis: Unsteadiness on feet (R26.81);Other abnormalities of gait and mobility (R26.89);Muscle weakness (generalized) (M62.81)    Time: 1315-1350 PT Time Calculation (min) (ACUTE ONLY): 35 min   Charges:   PT Evaluation $PT Eval Moderate Complexity: 1 Mod PT Treatments $Therapeutic Activity: 23-37 mins        2:06 PM, 08/01/20 Mearl Latin PT, DPT Physical Therapist at [facility]

## 2020-08-01 NOTE — Progress Notes (Signed)
EEG Completed; Results Pending  

## 2020-08-01 NOTE — Progress Notes (Signed)
NT notified this nurse that pts blood pressure was decreased from previous reading. Pulse was in the 40's-50's. Pt was alert during care at this time. Charge nurse notified,  Rapid notified and MD notified. Non-rebreather placed due to sats at 86% on 6L nasal canula. tx team at bedside. New orders to transfer pt to ICU for higher level of care. Report given to [facility] RN in ICU via phone and face-face. Pts belongings such as cell phone, charger and shirt were taken to pts room in ICU as well.

## 2020-08-01 NOTE — Progress Notes (Signed)
   08/01/20 1657  Assess: MEWS Score  BP (!) 81/60  Pulse Rate (!) 101  Resp 20  Level of Consciousness Alert  SpO2 98 %  O2 Device Non-rebreather Mask  Assess: MEWS Score  MEWS Temp 0  MEWS Systolic 1  MEWS Pulse 1  MEWS RR 0  MEWS LOC 0  MEWS Score 2  MEWS Score Color Yellow  Assess: if the MEWS score is Yellow or Red  Were vital signs taken at a resting state? Yes  Focused Assessment Change from prior assessment (see assessment flowsheet)  Early Detection of Sepsis Score *See Row Information* Medium  Treat  MEWS Interventions Escalated (See documentation below)  Pain Scale 0-10  Pain Score 0  Take Vital Signs  Increase Vital Sign Frequency  Yellow: Q 2hr X 2 then Q 4hr X 2, if remains yellow, continue Q 4hrs  Escalate  MEWS: Escalate Yellow: discuss with charge nurse/RN and consider discussing with provider and RRT  Notify: Charge Nurse/RN  Name of Charge Nurse/RN Notified Lowry Ram   Date Charge Nurse/RN Notified 08/01/20  Time Charge Nurse/RN Notified 1715  Notify: Provider  Provider Name/Title Dr Tat   Date Provider Notified 08/01/20  Time Provider Notified 1715  Notification Type Face-to-face  Notification Reason Change in status  Response See new orders (Transfer to unit )  Date of Provider Response 08/01/20  Time of Provider Response 7471  Notify: Rapid Response  Name of Rapid Response RN Notified Tiffany   Date Rapid Response Notified 08/01/20  Time Rapid Response Notified 8550  Document  Patient Outcome Transferred/level of care increased

## 2020-08-01 NOTE — Plan of Care (Signed)
°  Problem: Acute Rehab PT Goals(only PT should resolve) Goal: Pt Will Go Supine/Side To Sit Outcome: Progressing Flowsheets (Taken 08/01/2020 1407) Pt will go Supine/Side to Sit: with modified independence Goal: Pt Will Go Sit To Supine/Side Outcome: Progressing Flowsheets (Taken 08/01/2020 1407) Pt will go Sit to Supine/Side: with modified independence Goal: Patient Will Transfer Sit To/From Stand Outcome: Progressing Flowsheets (Taken 08/01/2020 1407) Patient will transfer sit to/from stand: with min guard assist Goal: Pt Will Transfer Bed To Chair/Chair To Bed Outcome: Progressing Flowsheets (Taken 08/01/2020 1407) Pt will Transfer Bed to Chair/Chair to Bed: min guard assist Goal: Pt Will Ambulate Outcome: Progressing Flowsheets (Taken 08/01/2020 1407) Pt will Ambulate:  50 feet  with supervision  with least restrictive assistive device Goal: Pt/caregiver will Perform Home Exercise Program Outcome: Progressing Flowsheets (Taken 08/01/2020 1407) Pt/caregiver will Perform Home Exercise Program:  For increased strengthening  For improved balance  Independently   2:08 PM, 08/01/20 Mearl Latin PT, DPT Physical Therapist at [facility]

## 2020-08-01 NOTE — [place] FL2 (Signed)
[place] LEVEL OF CARE SCREENING TOOL     IDENTIFICATION  Patient Name: Theresa [name] Birthdate: [birth year] Sex: female Admission Date (Current Location): 08/01/2020  [facility] and [place] Number:  [employer] and Address:  [place] [address] [place]      Provider Number: [id number]  Attending Physician Name and Address:  Orson Eva, MD  Relative Name and Phone Number:  Skarlett Sedlacek (husband) Ph: [phone]    Current Level of Care: Hospital Recommended Level of Care: [place] Prior Approval Number:    Date Approved/Denied:   PASRR Number:    Discharge Plan: SNF    Current Diagnoses: Patient Active Problem List   Diagnosis Date Noted  . Acute renal failure superimposed on stage 3b chronic kidney disease ([place]) 08/01/2020  . Acute renal failure superimposed on stage 3a chronic kidney disease ([place]) 08/01/2020  . Acute on chronic respiratory failure ([place]) 07/21/2020  . Hypoxemia   . Atrial flutter with rapid ventricular response ([place])   . Sepsis due to Escherichia coli (E. coli) ([place]) 06/16/2020  . Bacteremia due to Gram-negative bacteria 06/16/2020  . Severe sepsis ([place]) 06/15/2020  . Biliary colic 88/50/2774  . Abdominal pain 06/15/2020  . Transaminitis 06/15/2020  . Sepsis due to undetermined organism ([place]) 06/15/2020  . Systolic and diastolic CHF, chronic ([place]) 06/15/2020  . CHF (congestive heart failure) ([place]) 04/18/2020  . Acute respiratory failure ([place]) 04/17/2020  . Chronic venous insufficiency 06/19/2019  . Cholelithiasis 05/07/2019  . Adult body mass index 50.0-59.9 ([place]) 03/22/2019  . Anxiety 03/22/2019  . Low back pain 03/22/2019  . Cigarette nicotine dependence without complication 12/87/8676  . History of vitamin D deficiency 03/22/2019  . Insomnia 03/22/2019  . Knee pain 03/22/2019  . Localized, primary osteoarthritis of lower leg, unspecified laterality 03/22/2019  .  Neck pain 03/22/2019  . Psoriasis 03/22/2019  . Thrombophlebitis of deep veins of lower extremity ([place]) 03/22/2019  . DVT (deep venous thrombosis) ([place]) 03/22/2019  . Fracture of humeral shaft, left, closed 03/22/2019  . Chronic respiratory insufficiency 01/19/2019  . CAD (coronary artery disease) 01/15/2019  . Educated about COVID-19 virus infection 01/12/2019  . Acute on chronic combined systolic and diastolic HF (heart failure) ([place]) 01/12/2019  . Acute on chronic respiratory failure with hypoxia ([place]) 10/24/2018  . Pulmonary hypertension, unspecified ([place])   . Acute on chronic respiratory failure with hypoxia ([place]) 08/01/2017  . Uncontrolled type 2 diabetes mellitus with hyperglycemia, with long-term current use of insulin ([place]) 08/01/2017  . Dyspnea 07/31/2017  . Nausea & vomiting   . Acute on chronic congestive heart failure ([place])   . Acute respiratory failure with hypoxia ([place]) 01/22/2016  . Hyperglycemia due to diabetes mellitus ([place]) 01/22/2016  . CAD (coronary artery disease) of artery bypass graft: PTCA/DES to distal body VG to PDA 10/29/15 11/03/2015  . Tracheostomy in place [facility]), chronic since 2002 11/03/2015  . Hypokalemia 11/03/2015  . Lupus anticoagulant syndrome ([place])   . Chronic diastolic CHF (congestive heart failure) ([place])   . Respiratory failure ([place])   . Coronary artery disease involving coronary bypass graft of native heart without angina pectoris   . OSA (obstructive sleep apnea)   . COPD exacerbation ([place])   . Tobacco abuse   . DM (diabetes mellitus) ([place]) 06/11/2009  . Hyperlipidemia LDL goal <70 06/11/2009  . Acute thromboembolism of deep veins of lower extremity ([place]) 06/11/2009  . History of CABG 06/11/2009  . Essential hypertension  05/27/2009  . CAD, NATIVE VESSEL 05/27/2009    Orientation RESPIRATION BLADDER Height & Weight     Self, Time, Situation, Place  Tracheostomy, O2 (4-6L/min) Incontinent, Indwelling catheter Weight: 205 lb 11 oz (93.3  kg) Height:  4\' 10"  (147.3 cm)  BEHAVIORAL SYMPTOMS/MOOD NEUROLOGICAL BOWEL NUTRITION STATUS      Continent Diet (Carb modified)  AMBULATORY STATUS COMMUNICATION OF NEEDS Skin   Limited Assist Verbally Other (Comment) (Bilateral leg rash)                       Personal Care Assistance Level of Assistance  Bathing, Feeding, Dressing Bathing Assistance: Limited assistance Feeding assistance: Independent Dressing Assistance: Limited assistance     Functional Limitations Info  Sight, Hearing, Speech Sight Info: Impaired Hearing Info: Adequate Speech Info: Adequate    SPECIAL CARE FACTORS FREQUENCY  PT (By licensed PT)     PT Frequency: 5x's/week              Contractures Contractures Info: Not present    Additional Factors Info  Code Status, Insulin Sliding Scale, Allergies, Psychotropic Code Status Info: Full Allergies Info: Penicillins; Ciprofloxacin; Ibuprofen Psychotropic Info: Zoloft (sertraline) Insulin Sliding Scale Info: See discharge summary       Current Medications (08/01/2020):  This is the current hospital active medication list Current Facility-Administered Medications  Medication Dose Route Frequency Provider Last Rate Last Admin  . acetaminophen (TYLENOL) tablet 650 mg  650 mg Oral Q6H PRN Tat, David, MD       Or  . acetaminophen (TYLENOL) suppository 650 mg  650 mg Rectal Q6H PRN Tat, Shanon Brow, MD      . amiodarone (PACERONE) tablet 200 mg  200 mg Oral Daily Tat, David, MD   200 mg at 08/01/20 1256  . budesonide (PULMICORT) nebulizer solution 0.5 mg  0.5 mg Nebulization BID Tat, David, MD   0.5 mg at 08/01/20 1253  . insulin aspart (novoLOG) injection 0-15 Units  0-15 Units Subcutaneous TID WC Tat, David, MD      . insulin aspart (novoLOG) injection 0-5 Units  0-5 Units Subcutaneous QHS Tat, David, MD      . ipratropium-albuterol (DUONEB) 0.5-2.5 (3) MG/3ML nebulizer solution 3 mL  3 mL Nebulization Q6H Tat, Shanon Brow, MD   3 mL at 08/01/20 1253  .  ondansetron (ZOFRAN) tablet 4 mg  4 mg Oral Q6H PRN Tat, David, MD       Or  . ondansetron (ZOFRAN) injection 4 mg  4 mg Intravenous Q6H PRN Tat, David, MD      . pantoprazole (PROTONIX) EC tablet 40 mg  40 mg Oral Daily Tat, David, MD   40 mg at 08/01/20 1256  . polyethylene glycol (MIRALAX / GLYCOLAX) packet 17 g  17 g Oral Daily PRN Tat, David, MD      . sertraline (ZOLOFT) tablet 50 mg  50 mg Oral QHS Tat, Shanon Brow, MD      . tamsulosin (FLOMAX) capsule 0.4 mg  0.4 mg Oral Daily Tat, David, MD   0.4 mg at 08/01/20 1256  . warfarin (COUMADIN) tablet 2.5 mg  2.5 mg Oral Darius Bump, MD      . Warfarin - Pharmacist Dosing Inpatient   Does not apply q1600 Tat, Shanon Brow, MD         Discharge Medications: Please see discharge summary for a list of discharge medications.  Relevant Imaging Results:  Relevant Lab Results:   Additional Information SSN: [id number]  [facility]  Roda Shutters, LCSW

## 2020-08-01 NOTE — TOC Initial Note (Signed)
Transition of Care [facility]) - Initial/Assessment Note   Patient Details  Name: Theresa [name] MRN: [id number] Date of Birth: Nov 25, 1965  [facility]) CM/SW Contact:    Sherie Don, LCSW Phone Number: 08/01/2020, 2:01 PM  Clinical Narrative: Patient is a 54 year old female who was admitted for acute renal failure superimposed on stage 3b chronic kidney disease. Patient has a history of tobacco abuse, coronary artery disease involving coronary bypass graft of native heart without angina pectoris, lupus anticoagulant syndrome, type 2 diabetes mellitus with hyperglycemia, anxiety, and systolic and diastolic CHF.  PT evaluation recommended SNF. CSW spoke with patient and patient agreeable to being referred out for SNF. FL2 completed and faxed out to SNFs. PASRR pending due to level II review; requested documents have been uploaded. TOC to follow.  Expected Discharge Plan: Skilled Nursing Facility Barriers to Discharge: Continued Medical Work up  Patient Goals and CMS Choice Patient states their goals for this hospitalization and ongoing recovery are:: Go to rehab to get better CMS Medicare.gov Compare Post Acute Care list provided to:: Patient Choice offered to / list presented to : Patient  Expected Discharge Plan and Services Expected Discharge Plan: [place] In-house Referral: Clinical Social Work Discharge Planning Services: NA Post Acute Care Choice: [place] Living arrangements for the past 2 months: Single Family Home              DME Arranged: N/A DME Agency: NA HH Arranged: NA [place] Agency: NA  Prior Living Arrangements/Services Living arrangements for the past 2 months: [place] Lives with:: Spouse, Relatives Patient language and need for interpreter reviewed:: Yes Do you feel safe going back to the place where you live?: Yes      Need for Family Participation in Patient Care: No (Comment) Care giver support system in  place?: Yes (comment) Current home services: Home RN Criminal Activity/Legal Involvement Pertinent to Current Situation/Hospitalization: No - Comment as needed  Permission Sought/Granted Permission sought to share information with : Facility Art therapist granted to share information with : Yes, Verbal Permission Granted Permission granted to share info w AGENCY: SNFs  Emotional Assessment Appearance:: Appears older than stated age Attitude/Demeanor/Rapport: Lethargic Affect (typically observed): Accepting Orientation: : Oriented to Self, Oriented to Place, Oriented to  Time, Oriented to Situation Alcohol / Substance Use: Not Applicable Psych Involvement: No (comment)  Admission diagnosis:  Acute renal failure superimposed on stage 3b chronic kidney disease, unspecified acute renal failure type ([place]) [N17.9, N18.32] Patient Active Problem List   Diagnosis Date Noted  . Acute renal failure superimposed on stage 3b chronic kidney disease ([place]) 08/01/2020  . Acute renal failure superimposed on stage 3a chronic kidney disease ([place]) 08/01/2020  . Acute on chronic respiratory failure ([place]) 07/21/2020  . Hypoxemia   . Atrial flutter with rapid ventricular response ([place])   . Sepsis due to Escherichia coli (E. coli) ([place]) 06/16/2020  . Bacteremia due to Gram-negative bacteria 06/16/2020  . Severe sepsis ([place]) 06/15/2020  . Biliary colic 63/78/5885  . Abdominal pain 06/15/2020  . Transaminitis 06/15/2020  . Sepsis due to undetermined organism ([place]) 06/15/2020  . Systolic and diastolic CHF, chronic ([place]) 06/15/2020  . CHF (congestive heart failure) ([place]) 04/18/2020  . Acute respiratory failure ([place]) 04/17/2020  . Chronic venous insufficiency 06/19/2019  . Cholelithiasis 05/07/2019  . Adult body mass index 50.0-59.9 ([place]) 03/22/2019  . Anxiety 03/22/2019  . Low back pain 03/22/2019  . Cigarette nicotine dependence without complication  03/22/2019  . History of vitamin  D deficiency 03/22/2019  . Insomnia 03/22/2019  . Knee pain 03/22/2019  . Localized, primary osteoarthritis of lower leg, unspecified laterality 03/22/2019  . Neck pain 03/22/2019  . Psoriasis 03/22/2019  . Thrombophlebitis of deep veins of lower extremity ([place]) 03/22/2019  . DVT (deep venous thrombosis) ([place]) 03/22/2019  . Fracture of humeral shaft, left, closed 03/22/2019  . Chronic respiratory insufficiency 01/19/2019  . CAD (coronary artery disease) 01/15/2019  . Educated about COVID-19 virus infection 01/12/2019  . Acute on chronic combined systolic and diastolic HF (heart failure) ([place]) 01/12/2019  . Acute on chronic respiratory failure with hypoxia ([place]) 10/24/2018  . Pulmonary hypertension, unspecified ([place])   . Acute on chronic respiratory failure with hypoxia ([place]) 08/01/2017  . Uncontrolled type 2 diabetes mellitus with hyperglycemia, with long-term current use of insulin ([place]) 08/01/2017  . Dyspnea 07/31/2017  . Nausea & vomiting   . Acute on chronic congestive heart failure ([place])   . Acute respiratory failure with hypoxia ([place]) 01/22/2016  . Hyperglycemia due to diabetes mellitus ([place]) 01/22/2016  . CAD (coronary artery disease) of artery bypass graft: PTCA/DES to distal body VG to PDA 10/29/15 11/03/2015  . Tracheostomy in place [facility]), chronic since 2002 11/03/2015  . Hypokalemia 11/03/2015  . Lupus anticoagulant syndrome ([place])   . Chronic diastolic CHF (congestive heart failure) ([place])   . Respiratory failure ([place])   . Coronary artery disease involving coronary bypass graft of native heart without angina pectoris   . OSA (obstructive sleep apnea)   . COPD exacerbation ([place])   . Tobacco abuse   . DM (diabetes mellitus) ([place]) 06/11/2009  . Hyperlipidemia LDL goal <70 06/11/2009  . Acute thromboembolism of deep veins of lower extremity ([employer]) 06/11/2009  . History of CABG 06/11/2009  . Essential hypertension 05/27/2009  . CAD, NATIVE VESSEL 05/27/2009   PCP:  Monico Blitz, MD Pharmacy:   [facility] [address] [place] [address] Phone: [phone] Fax: [phone]  Readmission Risk Interventions Readmission Risk Prevention Plan 07/22/2020 05/17/2019  Transportation Screening Complete Complete  PCP or Specialist Appt within 3-5 Days - Complete  HRI or [place] - Complete  Social Work Consult for [place] Planning/Counseling - Complete  Palliative Care Screening - Not Applicable  Medication Review (RN Care Manager) Complete Complete  Palliative Care Screening Not Complete -  [place] Not Applicable -  Some recent data might be hidden

## 2020-08-01 NOTE — H&P (Addendum)
History and Physical  Theresa [name] EZM:[id number] DOB: 24-Aug-1966 DOA: 08/01/2020   PCP: Monico Blitz, MD   Patient coming from: Home  Chief Complaint: passing out, dizziness, gen weak  HPI:  Theresa [name] is a 54 year old female with systolic and diastolic CHF, COPD, continued tobacco abuse, diabetes mellitus type 2, lupus anticoagulant syndrome, coronary artery disease with history of STEMI, hypertension, chronic respiratory failure on 4 L, chronic tracheostomy presenting with 2 day history of dizziness, worsening generalized weakness, and near syncope.  Notably, the patient was recently admitted to the hospital from 07/21/2020 to 07/29/2020 when she was treated for acute on chronic respiratory failure secondary to decompensated CHF.  The patient was discharged home with Bumex 6 mg in the morning and 4 mg in the evening.  It appeared that her discharge weight was 93.3 kg (205.7 lbs).  The patient endorsed compliance with her Bumex at home.  However she continued to have poor oral intake.  She stated that she has had episodes of dizziness and generalized weakness requiring increasing help from her family.  She was try to get up off the commode and was so weak she needed help.  Her family was not able to help her so EMS was activated.  Since discharge from the hospital, the patient denies any worsening shortness of breath.  She denied any fevers, chills, chest pain, hemoptysis, nausea, vomiting, diarrhea, abdominal pain, headache, visual disturbance, focal extremity weakness.  She denies any dysuria, hematuria, hematochezia, melena.  She states that she has had episodes of near syncope where she describes the her visual field becomes black for short period of time.  This can usually happen even when she is at rest watching television.  EMS was activated, and the patient was taken to [facility].  BMP showed a sodium 133, potassium 4.1, chloride 87, CO2 41, BUN 86, creatinine 1.76.  WBC 19.6, hemoglobin  16.3, platelets 262,000.  Troponin 73>> 77.  LFTs were unremarkable.  Magnesium was 2.9.  Chest x-ray showed bilateral patchy infiltrates.  The patient requested transfer to [facility] for further evaluation and treatment.  Assessment/Plan: Generalized weakness -This is likely multifactorial including acute on chronic renal failure secondary to her diuresis -Obtain UA and urine culture -TSH -T03 -Folic acid -PT evaluation  Acute on chronic renal failure--CKD stage IIIa -Baseline creatinine 1.0-1.2 -Holding Bumex for today and reevaluate for restart 08/02/2020  COPD exacerbation -Continue prednisone -Currently stable on 5 L  Acute on chronic respiratory failure with hypoxia -Secondary to COPD exacerbation and possibly pneumonia -CT chest -Check procalcitonin -Normally on 4 L at home -Wean as tolerated  Atrial flutter -Remain on telemetry -Currently in sinus rhythm -Continue amiodarone  Chronic systolic and diastolic CHF -5/46/5681 echo EF 40-45%, grade 3 DD -07/02/20 echo EF 50%, global HK, indeterminant diastolic -holding bumex today and reeval tomorrow for restart -clinically euvolemic  Lupus anticoagulant syndrome -coumadin with pharmacy assistance  Coronary artery disease -No chest pain presently  Uncontrolled diabetes mellitus type 2 with hyperglycemia -holding lantus temporarily -NovoLog sliding scale -10/11-hemoglobin A1c--8.9 -holding Ozempic  Depression -Continue sertraline and home dose lorazepam       Past Medical History:  Diagnosis Date  . Acute kidney failure with lesion of tubular necrosis (HCC)   . Acute systolic heart failure ([place])   . CAD (coronary artery disease)    a. s/p prior PCI. b. CABG 2007 at [facility] in [place] 2007. c. inferior STEMI 10/2015 s/p DES to  dSVG-PDA.  . Cardiac arrest ([place])   . Cervical cancer ([place])   . Chronic diastolic CHF (congestive heart failure) ([place])   . Chronic respiratory failure  ([place])    s/p tracheostomy 2002  . Chronic RUQ pain   . COPD (chronic obstructive pulmonary disease) ([place])   . Diabetes mellitus ([place])   . DVT (deep venous thrombosis) ([place])   . Endometriosis   . History of gallstones 01/2016   seen on Ultrasound  . History of HIDA scan 11/2016   normal  . HTN (hypertension)   . Hyperlipidemia   . Lupus anticoagulant disorder (HCC)    on coumadin  . Morbid obesity ([place])   . Psoriasis   . ST elevation (STEMI) myocardial infarction involving right coronary artery ([place]) 10/29/15   stent to VG to PDA  . Toe fracture, right 03/29/2018  . Tracheostomy in place [facility]), chronic since 2002 11/03/2015   Past Surgical History:  Procedure Laterality Date  . CARDIAC CATHETERIZATION N/A 10/29/2015   Procedure: Left Heart Cath and Cors/Grafts Angiography;  Surgeon: Burnell Blanks, MD;  Location: [place] [facility];  Service: Cardiovascular;  Laterality: N/A;  . CARDIAC CATHETERIZATION  10/29/2015   Procedure: Coronary Stent Intervention;  Surgeon: Burnell Blanks, MD;  Location: [place] [facility];  Service: Cardiovascular;;  . CAROTID STENT    . CESAREAN SECTION WITH BILATERAL TUBAL LIGATION    . CORONARY ARTERY BYPASS GRAFT  2007   2V  . IR GASTROSTOMY TUBE MOD SED  11/13/2018  . RIGHT/LEFT HEART CATH AND CORONARY/GRAFT ANGIOGRAPHY N/A 03/24/2018   Procedure: RIGHT/LEFT HEART CATH AND CORONARY/GRAFT ANGIOGRAPHY;  Surgeon: Belva Crome, MD;  Location: [place] [facility];  Service: Cardiovascular;  Laterality: N/A;  . TRACHEOSTOMY     Social History:  reports that she has been smoking cigarettes. She started smoking about 41 years ago. She has been smoking about 0.75 packs per day. She has never used smokeless tobacco. She reports that she does not drink alcohol and does not use drugs.   Family History  Problem Relation Age of Onset  . Hypertension Mother   . Diabetes Mother   . Hypertension Father   . Diabetes Father      Allergies    Allergen Reactions  . Penicillins Rash    Has patient had a PCN reaction causing immediate rash, facial/tongue/throat swelling, SOB or lightheadedness with hypotension: Yes Has patient had a PCN reaction causing severe rash involving mucus membranes or skin necrosis: No Has patient had a PCN reaction that required hospitalization No Has patient had a PCN reaction occurring within the last 10 years: No If all of the above answers are "NO", then may proceed with Cephalosporin use.   REACTION: rash Pt has tolerated cefepime in the past.  . Ciprofloxacin     nausea  . Ibuprofen Rash    swelling in leg      Prior to Admission medications   Medication Sig Start Date End Date Taking? Authorizing Provider  albuterol (PROVENTIL) (2.5 MG/3ML) 0.083% nebulizer solution Take 3 mLs (2.5 mg total) by nebulization every 4 (four) hours as needed for wheezing. 07/07/20   Johnson, Clanford L, MD  albuterol (VENTOLIN HFA) 108 (90 Base) MCG/ACT inhaler Inhale 2 puffs into the lungs every 4 (four) hours as needed for wheezing or shortness of breath.    [provider]  amiodarone (PACERONE) 200 MG tablet Take 1 tablet (200 mg total) by mouth daily. 07/30/20 08/29/20  Heath Lark  D, DO  bumetanide (BUMEX) 2 MG tablet Take 1 tablet (2 mg total) by mouth daily. Takes 6 mg am and 4 mg pm 07/29/20   Manuella Ghazi, Pratik D, DO  gabapentin (NEURONTIN) 300 MG capsule Take 3 capsules (900 mg total) by mouth at bedtime. 07/07/20   Johnson, Clanford L, MD  insulin degludec (TRESIBA FLEXTOUCH) 100 UNIT/ML FlexTouch Pen Inject 30 Units into the skin at bedtime. 07/07/20   Johnson, Clanford L, MD  LORazepam (ATIVAN) 0.5 MG tablet Take 0.5 mg by mouth every 6 (six) hours as needed for anxiety.     [provider]  mometasone (ELOCON) 0.1 % ointment Apply 1 application topically daily as needed. 06/24/20   [provider]  nitroGLYCERIN (NITROSTAT) 0.4 MG SL tablet Place 0.4 mg under the tongue every 5  (five) minutes as needed for chest pain.    [provider]  NOVOLOG FLEXPEN 100 UNIT/ML FlexPen Inject 1-10 Units into the skin 3 (three) times daily before meals. 04/06/20   [provider]  OZEMPIC, 0.25 OR 0.5 MG/DOSE, 2 MG/1.5ML SOPN Inject 0.5 mg into the skin every Wednesday.  04/08/19   [provider]  pantoprazole (PROTONIX) 20 MG tablet Take 20 mg by mouth daily. 02/16/20   [provider]  polyethylene glycol (MIRALAX / GLYCOLAX) 17 g packet Take 17 g by mouth daily as needed for mild constipation. 06/20/20   Kathie Dike, MD  potassium chloride SA (KLOR-CON) 20 MEQ tablet Take 2 tablets (40 mEq total) by mouth daily. 07/07/20 08/06/20  Johnson, Clanford L, MD  predniSONE (DELTASONE) 20 MG tablet Take 2 tablets (40 mg total) by mouth daily for 5 days. 07/29/20 08/03/20  Manuella Ghazi, Pratik D, DO  Pseudoeph-Doxylamine-DM-APAP (NYQUIL PO) Take 20 mLs by mouth daily.    [provider]  sertraline (ZOLOFT) 50 MG tablet Take 50 mg by mouth at bedtime.     [provider]  tamsulosin (FLOMAX) 0.4 MG CAPS capsule Take 0.4 mg by mouth daily.    [provider]  traMADol (ULTRAM) 50 MG tablet Take 50 mg by mouth 3 (three) times daily as needed for moderate pain.  03/29/20   [provider]  warfarin (COUMADIN) 2.5 MG tablet Take 1-2 tablets (2.5-5 mg total) by mouth See admin instructions. Mon,Wed,Fri take 5mg   2.5mg  all other days 07/07/20   Murlean Iba, MD    Review of Systems:  Constitutional:  No weight loss, night sweats, Fevers, chills, fatigue.  Head&Eyes: No headache.  No vision loss.  No eye pain or scotoma ENT:  No Difficulty swallowing,Tooth/dental problems,Sore throat,  No ear ache, post nasal drip,  Cardio-vascular:  No chest pain, Orthopnea, PND, swelling in lower extremities,  dizziness, palpitations  GI:  No  abdominal pain, nausea, vomiting, diarrhea,  hematochezia, melena, heartburn, indigestion, Resp:   No shortness of breath with exertion or at rest. No cough. No coughing up of blood .No wheezing.No chest wall deformity  Skin:  no rash or lesions.  GU:  no dysuria, change in color of urine, no urgency or frequency. No flank pain.  Musculoskeletal:  No joint pain or swelling. No decreased range of motion. No back pain.  Psych:  No change in mood or affect. No depression or anxiety. Neurologic: No headache, no dysesthesia, no focal weakness, no vision loss. No syncope  Physical Exam: Vitals:   08/01/20 1100  BP: 96/76  Pulse: 87  Resp: 18  Temp: (!) 97.3 F (36.3 C)  TempSrc: Oral  SpO2: 95%  Weight: 93.3 kg  Height: 4\' 10"  (1.473 m)   General:  A&O x 3, NAD, nontoxic, pleasant/cooperative Head/Eye: No conjunctival hemorrhage, no icterus, [place]/AT, No nystagmus ENT:  No icterus,  No thrush, good dentition, no pharyngeal exudate Neck:  No masses, no lymphadenpathy, no bruits CV:  IRRR, no rub, no gallop, no S3 Lung:  Bibasilar crackles. No wheeze Abdomen: soft/NT, +BS, nondistended, no peritoneal signs Ext: No cyanosis, No rashes, No petechiae, No lymphangitis, No edema Neuro: CNII-XII intact, strength 4/5 in bilateral upper and lower extremities, no dysmetria  Labs on Admission:  Basic Metabolic Panel: Recent Labs  Lab 07/26/20 0456 07/26/20 0456 07/27/20 0451 07/27/20 0451 07/28/20 0641 07/29/20 0534  NA 141  --  139  --  139 136  K 4.7   < > 3.9   < > 4.1 3.7  CL 88*  --  85*  --  86* 80*  CO2 41*  --  44*  --  42* 43*  GLUCOSE 167*  --  59*  --  93 99  BUN 42*  --  42*  --  47* 61*  CREATININE 1.10*  --  1.14*  --  1.15* 1.34*  CALCIUM 9.1  --  9.2  --  8.9 9.4  MG 2.2  --  2.3  --  2.5* 2.5*   < > = values in this interval not displayed.   Liver Function Tests: No results for input(s): AST, ALT, ALKPHOS, BILITOT, PROT, ALBUMIN in the last 168 hours. No results for input(s): LIPASE, AMYLASE in the last 168 hours. No results for input(s): AMMONIA in the  last 168 hours. CBC: Recent Labs  Lab 07/26/20 0456 07/27/20 0451 07/29/20 0534  WBC 8.0 11.8* 13.2*  HGB 12.3 13.0 15.2*  HCT 39.7 41.7 47.2*  MCV 101.8* 99.5 96.9  PLT 226 285 274   Coagulation Profile: Recent Labs  Lab 07/26/20 1019 07/27/20 0451 07/28/20 0641 07/29/20 0534  INR 2.5* 2.4* 1.9* 1.8*   Cardiac Enzymes: No results for input(s): CKTOTAL, CKMB, CKMBINDEX, TROPONINI in the last 168 hours. BNP: Invalid input(s): POCBNP CBG: Recent Labs  Lab 07/28/20 1618 07/28/20 2046 07/29/20 0802 07/29/20 1124 08/01/20 1122  GLUCAP 380* 202* 86 296* 87   Urine analysis:    Component Value Date/Time   COLORURINE AMBER (A) 06/15/2020 0119   APPEARANCEUR CLEAR 06/15/2020 0119   LABSPEC 1.014 06/15/2020 0119   PHURINE 6.0 06/15/2020 0119   GLUCOSEU NEGATIVE 06/15/2020 0119   HGBUR NEGATIVE 06/15/2020 0119   BILIRUBINUR SMALL (A) 06/15/2020 0119   KETONESUR NEGATIVE 06/15/2020 0119   PROTEINUR 30 (A) 06/15/2020 0119   NITRITE NEGATIVE 06/15/2020 0119   LEUKOCYTESUR NEGATIVE 06/15/2020 0119   Sepsis Labs: @LABRCNTIP (procalcitonin:4,lacticidven:4) )No results found for this or any previous visit (from the past 240 hour(s)).   Radiological Exams on Admission: No results found.  EKG: Independently reviewed. pending    Time spent:60 minutes Code Status:   FULL Family Communication:  No Family at bedside Disposition Plan: expect 2-3 day hospitalization Consults called: none DVT Prophylaxis: warfarin  Orson Eva, DO  [facility] Pager [phone]  If 7PM-7AM, please contact night-coverage [email or web address] Password [facility] 08/01/2020, 11:58 AM

## 2020-08-01 NOTE — Procedures (Signed)
Patient Name: Theresa [name]  MRN: [id number]  Epilepsy Attending: Lora Havens  Referring Physician/Provider: Dr Shanon Brow Tat Date: 08/01/2020  Duration: 23.43 mins  Patient history: 54yo with an episode of near syncope. EEG to evaluate for seizure.  Level of alertness: Awake  AEDs during EEG study: None  Technical aspects: This EEG study was done with scalp electrodes positioned according to the 10-20 International system of electrode placement. Electrical activity was acquired at a sampling rate of 500Hz  and reviewed with a high frequency filter of 70Hz  and a low frequency filter of 1Hz . EEG data were recorded continuously and digitally stored.   Description: The posterior dominant rhythm consists of 8 Hz activity of moderate voltage (25-35 uV) seen predominantly in posterior head regions, symmetric and reactive to eye opening and eye closing. EEG showed intermittent generalized 3 to 6 Hz theta-delta slowing. Hyperventilation and photic stimulation were not performed.    ABNORMALITY -Intermittent slow, generalized  IMPRESSION: This study is suggestive of mild diffuse encephalopathy, nonspecific etiology. No seizures or epileptiform discharges were seen throughout the recording.  Zahir Eisenhour Barbra Sarks

## 2020-08-01 NOTE — Progress Notes (Signed)
ANTICOAGULATION CONSULT NOTE   Pharmacy Consult for Warfarin Indication: lupus anticoagulant  Syndrome/afib   Patient Measurements: Height: 4\' 10"  (147.3 cm) Weight: 93.3 kg (205 lb 11 oz) IBW/kg (Calculated) : 40.9  Vital Signs: Temp: 97.3 F (36.3 C) (11/26 1100) Temp Source: Oral (11/26 1100) BP: 96/76 (11/26 1100) Pulse Rate: 87 (11/26 1100)  Labs: Recent Labs    08/01/20 1251  HGB 15.9*  HCT 48.9*  PLT 245  LABPROT 27.9*  INR 2.7*    Estimated Creatinine Clearance: 46.9 mL/min (A) (by C-G formula based on SCr of 1.34 mg/dL (H)).  Assessment: 54 yr old female presented to [facility] ED with SOB. Medical hx includes lupus anticoagulant with hx of VTE, for which she is on anticoagulation with warfarin PTA (5 mg MWF, 2.5 mg other days of the week; per med rec, last dose was on the evening of 07/31/20). Pt also has recent dx of atrial flutter.  Was started on amiodarone recently on last admission.   Major Drug Interaction: Target a 25- 50% reduction in weekly warfarin dose after 2 weeks of dual therapy with amiodarone and warfarin  INR 2.7 on admission - Last dose 11/25 per patient CBC WNL    Goal of Therapy:  INR 2-3 Monitor platelets by anticoagulation protocol: Yes   Plan:  Warfarin 2.5 mg x 1 dose Consider weekly dose reduction of warfarin d/t major interaction w/ amiodarone Daily INR and CBC Monitor for signs/symptoms of bleeding  Margot Ables, PharmD Clinical Pharmacist 08/01/2020 1:29 PM

## 2020-08-01 NOTE — Care Management (Signed)
RE: Theresa [name] DOB: [birth year] Date: 08/01/2020 MUST ID: [id number]  To Whom It May Concern:  Please be advised that the above name patient will require a short-term nursing home stay--anticipated 30 days or less rehabilitation and strengthening. The plan is for return home.

## 2020-08-02 ENCOUNTER — Inpatient Hospital Stay (HOSPITAL_COMMUNITY): Payer: Medicare Other

## 2020-08-02 ENCOUNTER — Inpatient Hospital Stay: Payer: Self-pay

## 2020-08-02 DIAGNOSIS — I5042 Chronic combined systolic (congestive) and diastolic (congestive) heart failure: Secondary | ICD-10-CM | POA: Diagnosis not present

## 2020-08-02 DIAGNOSIS — Z720 Tobacco use: Secondary | ICD-10-CM | POA: Diagnosis not present

## 2020-08-02 DIAGNOSIS — D6862 Lupus anticoagulant syndrome: Secondary | ICD-10-CM | POA: Diagnosis not present

## 2020-08-02 DIAGNOSIS — N179 Acute kidney failure, unspecified: Secondary | ICD-10-CM | POA: Diagnosis not present

## 2020-08-02 LAB — BASIC METABOLIC PANEL
Anion gap: 16 — ABNORMAL HIGH (ref 5–15)
BUN: 85 mg/dL — ABNORMAL HIGH (ref 6–20)
CO2: 38 mmol/L — ABNORMAL HIGH (ref 22–32)
Calcium: 8.5 mg/dL — ABNORMAL LOW (ref 8.9–10.3)
Chloride: 80 mmol/L — ABNORMAL LOW (ref 98–111)
Creatinine, Ser: 1.53 mg/dL — ABNORMAL HIGH (ref 0.44–1.00)
GFR, Estimated: 40 mL/min — ABNORMAL LOW (ref >60)
Glucose, Bld: 172 mg/dL — ABNORMAL HIGH (ref 70–99)
Potassium: 4 mmol/L (ref 3.5–5.1)
Sodium: 134 mmol/L — ABNORMAL LOW (ref 135–145)

## 2020-08-02 LAB — CBC
HCT: 48.4 % — ABNORMAL HIGH (ref 36.0–46.0)
Hemoglobin: 15.7 g/dL — ABNORMAL HIGH (ref 12.0–15.0)
MCH: 31.6 pg (ref 26.0–34.0)
MCHC: 32.4 g/dL (ref 30.0–36.0)
MCV: 97.4 fL (ref 80.0–100.0)
Platelets: 232 10*3/uL (ref 150–400)
RBC: 4.97 MIL/uL (ref 3.87–5.11)
RDW: 15.2 % (ref 11.5–15.5)
WBC: 15.8 10*3/uL — ABNORMAL HIGH (ref 4.0–10.5)
nRBC: 0 % (ref 0.0–0.2)

## 2020-08-02 LAB — GLUCOSE, CAPILLARY
Glucose-Capillary: 208 mg/dL — ABNORMAL HIGH (ref 70–99)
Glucose-Capillary: 243 mg/dL — ABNORMAL HIGH (ref 70–99)
Glucose-Capillary: 126 mg/dL — ABNORMAL HIGH (ref 70–99)
Glucose-Capillary: 159 mg/dL — ABNORMAL HIGH (ref 70–99)

## 2020-08-02 LAB — PROTIME-INR
INR: 2.7 — ABNORMAL HIGH (ref 0.8–1.2)
Prothrombin Time: 27.9 seconds — ABNORMAL HIGH (ref 11.4–15.2)

## 2020-08-02 LAB — PROCALCITONIN: Procalcitonin: <0.1 ng/mL

## 2020-08-02 MED ORDER — WARFARIN SODIUM 2.5 MG PO TABS
2.5000 mg | ORAL_TABLET | Freq: Once | ORAL | Status: AC
  Administered 2020-08-02: 2.5 mg via ORAL
  Filled 2020-08-02: qty 1

## 2020-08-02 MED ORDER — ARFORMOTEROL TARTRATE 15 MCG/2ML IN NEBU
15.0000 ug | INHALATION_SOLUTION | Freq: Two times a day (BID) | RESPIRATORY_TRACT | Status: DC
  Administered 2020-08-02 – 2020-08-07 (×9): 15 ug via RESPIRATORY_TRACT
  Filled 2020-08-02 (×9): qty 2

## 2020-08-02 MED ORDER — CHLORHEXIDINE GLUCONATE CLOTH 2 % EX PADS
6.0000 | MEDICATED_PAD | Freq: Every day | CUTANEOUS | Status: DC
  Administered 2020-08-02 – 2020-08-07 (×6): 6 via TOPICAL

## 2020-08-02 MED ORDER — TRAMADOL HCL 50 MG PO TABS
50.0000 mg | ORAL_TABLET | Freq: Once | ORAL | Status: AC
  Administered 2020-08-03: 50 mg via ORAL
  Filled 2020-08-02: qty 1

## 2020-08-02 MED ORDER — SODIUM CHLORIDE 0.9 % IV BOLUS
500.0000 mL | Freq: Once | INTRAVENOUS | Status: AC
  Administered 2020-08-02: 500 mL via INTRAVENOUS

## 2020-08-02 MED ORDER — METHYLPREDNISOLONE SODIUM SUCC 125 MG IJ SOLR
80.0000 mg | Freq: Two times a day (BID) | INTRAMUSCULAR | Status: AC
  Administered 2020-08-02 – 2020-08-06 (×8): 80 mg via INTRAVENOUS
  Filled 2020-08-02 (×7): qty 2

## 2020-08-02 MED ORDER — WITCH HAZEL-GLYCERIN EX PADS: MEDICATED_PAD | CUTANEOUS | Status: DC | PRN

## 2020-08-02 NOTE — Progress Notes (Signed)
ANTICOAGULATION CONSULT NOTE   Pharmacy Consult for Warfarin Indication: lupus anticoagulant  Syndrome/afib   Patient Measurements: Height: 4\' 10"  (147.3 cm) Weight: 93.3 kg (205 lb 11 oz) IBW/kg (Calculated) : 40.9  Vital Signs: Temp: 98.1 F (36.7 C) (11/27 0833) Temp Source: Oral (11/27 0833) BP: 124/84 (11/27 0905) Pulse Rate: 108 (11/27 0905)  Labs: Recent Labs    08/01/20 1251 08/02/20 0427  HGB 15.9* 15.7*  HCT 48.9* 48.4*  PLT 245 232  LABPROT 27.9* 27.9*  INR 2.7* 2.7*  CREATININE 1.47* 1.53*    Estimated Creatinine Clearance: 41.1 mL/min (A) (by C-G formula based on SCr of 1.53 mg/dL (H)).  Assessment: 54 yr old female presented to [facility] ED with SOB. Medical hx includes lupus anticoagulant with hx of VTE, for which she is on anticoagulation with warfarin PTA (5 mg MWF, 2.5 mg other days of the week; per med rec, last dose was on the evening of 07/31/20). Pt also has recent dx of atrial flutter.  Was started on amiodarone recently on last admission.   Major Drug Interaction: Target a 25- 50% reduction in weekly warfarin dose after 2 weeks of dual therapy with amiodarone and warfarin  INR 2.7 on admission - Last dose 11/25 per patient CBC WNL   Goal of Therapy:  INR 2-3 Monitor platelets by anticoagulation protocol: Yes   Plan:  Warfarin 2.5 mg x 1 dose Consider weekly dose reduction of warfarin d/t major interaction w/ amiodarone Daily INR and CBC Monitor for signs/symptoms of bleeding  Thomasenia Sales, PharmD, MBA, BCGP Clinical Pharmacist  08/02/2020 12:53 PM

## 2020-08-02 NOTE — Progress Notes (Signed)
RN aware to notify Tiffany RN or CVW for PICC placement.

## 2020-08-02 NOTE — Progress Notes (Signed)
PROGRESS NOTE  Theresa [name] SJG:[id number] DOB: 1966/05/11 DOA: 08/01/2020 PCP: Monico Blitz, MD  Brief History:   54 year old female with systolic and diastolic CHF, COPD, continued tobacco abuse, diabetes mellitus type 2, lupus anticoagulant syndrome, coronary artery disease with history of STEMI, hypertension, chronic respiratory failure on 4 L, chronic tracheostomy presenting with 2 day history of dizziness, worsening generalized weakness, and near syncope.  Notably, the patient was recently admitted to the hospital from 07/21/2020 to 07/29/2020 when she was treated for acute on chronic respiratory failure secondary to decompensated CHF.  The patient was discharged home with Bumex 6 mg in the morning and 4 mg in the evening.  It appeared that her discharge weight was 93.3 kg (205.7 lbs).  The patient endorsed compliance with her Bumex at home.  However she continued to have poor oral intake.  She stated that she has had episodes of dizziness and generalized weakness requiring increasing help from her family.  She was try to get up off the commode and was so weak she needed help.  Her family was not able to help her so EMS was activated.  Since discharge from the hospital, the patient denies any worsening shortness of breath.  She denied any fevers, chills, chest pain, hemoptysis, nausea, vomiting, diarrhea, abdominal pain, headache, visual disturbance, focal extremity weakness.  She denies any dysuria, hematuria, hematochezia, melena.  She states that she has had episodes of near syncope where she describes the her visual field becomes black for short period of time.  This can usually happen even when she is at rest watching television.  EMS was activated, and the patient was taken to [facility].  BMP showed a sodium 133, potassium 4.1, chloride 87, CO2 41, BUN 86, creatinine 1.76.  WBC 19.6, hemoglobin 16.3, platelets 262,000.  Troponin 73>> 77.  LFTs were unremarkable.  Magnesium was 2.9.  Chest  x-ray showed bilateral patchy infiltrates.  The patient requested transfer to [facility] for further evaluation and treatment.   Assessment/Plan: Acute on chronic respiratory failure with hypoxia -due to COPD exacerbation -usually on 4L trach collar at home -currently on 8L trach collar (40% FiO2) -wean oxygen as tolerated back to baseline -11/27 CT chest--Hazy b/L GGO improving vs 04/18/20; bandlike densities upper and lower lobes; stable T9 compression fx -BNP 344 -PCT <0.10 x 2  COPD Exacerbation -increase solumedrol to 80 mg IV q 12hours -continue duonebs -continue pulmicort -add brovana -11/26 evening--pt had mucus plug-->respiratory distress and moved to stepdown unit  Generalized weakness -This is likely multifactorial including acute on chronic renal failure secondary to her diuresis -Obtain UA--no pyuria -MLY--6.503 -T46--568 -Folic LEXN--17.0 -PT evaluation  Acute on chronic renal failure--CKD stage IIIa -Baseline creatinine 1.0-1.2 -serum creatinine peaked 1.76 at [facility] on 11/25 -Holding Bumex for today and reevaluate for restart 08/03/2020  Atrial flutter -Remain on telemetry -Currently in sinus rhythm -Continue amiodarone  Chronic systolic and diastolic CHF -0/17/4944 echo EF 40-45%, grade 3 DD -07/02/20 echo EF 50%, global HK, indeterminant diastolic -holding bumex today and reeval tomorrow for restart -clinically euvolemic  Lupus anticoagulant syndrome -coumadin with pharmacy assistance  Coronary artery disease -No chest pain presently  Uncontrolled diabetes mellitus type 2 with hyperglycemia -holding lantus temporarily due to poor po intake -NovoLog sliding scale -06/16/20-hemoglobin A1c--8.9 -08/01/20 A1c--8.2 -holding Ozempic  Depression -Continue sertraline and home dose lorazepam        Status is: Inpatient  Remains inpatient appropriate because:IV treatments  appropriate due to intensity of illness or inability  to take PO   Dispo: The patient is from: Home              Anticipated d/c is to: Home              Anticipated d/c date is: 2 days              Patient currently is not medically stable to d/c.        Family Communication:   Spouse updated at bedside 11/27  Consultants:  none  Code Status:  FULL  DVT Prophylaxis:  warfarin   Procedures: As Listed in Progress Note Above  Antibiotics: None       Subjective:  Patient feels breathing is a little better than yesterday.  Denies f/c, cp, n/v/d, abd pain Objective: Vitals:   08/02/20 0600 08/02/20 0833 08/02/20 0905 08/02/20 1100  BP:   124/84   Pulse: 94  (!) 108   Resp: 15  (!) 22   Temp:  98.1 F (36.7 C)    TempSrc:  Oral    SpO2: 100%  99% (P) 96%  Weight:      Height:        Intake/Output Summary (Last 24 hours) at 08/02/2020 1252 Last data filed at 08/02/2020 1000 Gross per 24 hour  Intake 480 ml  Output 1100 ml  Net -620 ml   Weight change:  Exam:   General:  Pt is alert, follows commands appropriately, not in acute distress  HEENT: No icterus, No thrush, No neck mass, [place]/AT  Cardiovascular: RRR, S1/S2, no rubs, no gallops  Respiratory: bilateral rales.  Bilateral exp wheeze  Abdomen: Soft/+BS, non tender, non distended, no guarding  Extremities: trace LE edema, No lymphangitis, No petechiae, No rashes, no synovitis   Data Reviewed: I have personally reviewed following labs and imaging studies Basic Metabolic Panel: Recent Labs  Lab 07/27/20 0451 07/28/20 0641 07/29/20 0534 08/01/20 1251 08/02/20 0427  NA 139 139 136 133* 134*  K 3.9 4.1 3.7 3.7 4.0  CL 85* 86* 80* 78* 80*  CO2 44* 42* 43* 40* 38*  GLUCOSE 59* 93 99 76 172*  BUN 42* 47* 61* 85* 85*  CREATININE 1.14* 1.15* 1.34* 1.47* 1.53*  CALCIUM 9.2 8.9 9.4 8.7* 8.5*  MG 2.3 2.5* 2.5*  --   --    Liver Function Tests: Recent Labs  Lab 08/01/20 1251  AST 22  ALT 27  ALKPHOS 91  BILITOT 1.6*  PROT 7.1  ALBUMIN  3.6   No results for input(s): LIPASE, AMYLASE in the last 168 hours. No results for input(s): AMMONIA in the last 168 hours. Coagulation Profile: Recent Labs  Lab 07/27/20 0451 07/28/20 0641 07/29/20 0534 08/01/20 1251 08/02/20 0427  INR 2.4* 1.9* 1.8* 2.7* 2.7*   CBC: Recent Labs  Lab 07/27/20 0451 07/29/20 0534 08/01/20 1251 08/02/20 0427  WBC 11.8* 13.2* 20.0* 15.8*  HGB 13.0 15.2* 15.9* 15.7*  HCT 41.7 47.2* 48.9* 48.4*  MCV 99.5 96.9 96.6 97.4  PLT 285 274 245 232   Cardiac Enzymes: No results for input(s): CKTOTAL, CKMB, CKMBINDEX, TROPONINI in the last 168 hours. BNP: Invalid input(s): POCBNP CBG: Recent Labs  Lab 08/01/20 1122 08/01/20 1634 08/01/20 2024 08/02/20 0759 08/02/20 1122  GLUCAP 87 104* 102* 208* 243*   HbA1C: Recent Labs    08/01/20 1251  HGBA1C 8.2*   Urine analysis:    Component Value Date/Time   COLORURINE YELLOW 08/01/2020  [place] 08/01/2020 1539   LABSPEC 1.012 08/01/2020 1539   PHURINE 6.0 08/01/2020 1539   GLUCOSEU NEGATIVE 08/01/2020 1539   HGBUR MODERATE (A) 08/01/2020 [place] 08/01/2020 [place] 08/01/2020 1539   PROTEINUR 30 (A) 08/01/2020 1539   NITRITE NEGATIVE 08/01/2020 1539   LEUKOCYTESUR NEGATIVE 08/01/2020 1539   Sepsis Labs: @LABRCNTIP (procalcitonin:4,lacticidven:4) ) Recent Results (from the past 240 hour(s))  MRSA PCR Screening     Status: None   Collection Time: 08/01/20  6:05 PM   Specimen: Nasopharyngeal  Result Value Ref Range Status   MRSA by PCR NEGATIVE NEGATIVE Final    Comment:        The GeneXpert MRSA Assay (FDA approved for NASAL specimens only), is one component of a comprehensive MRSA colonization surveillance program. It is not intended to diagnose MRSA infection nor to guide or monitor treatment for MRSA infections. Performed at [facility], [address] [place], [place] [address]   Resp Panel by RT-PCR (Flu A&B, Covid)  Nasopharyngeal Swab     Status: None   Collection Time: 08/01/20  6:05 PM   Specimen: Nasopharyngeal Swab; Nasopharyngeal(NP) swabs in vial transport medium  Result Value Ref Range Status   SARS Coronavirus 2 by RT PCR NEGATIVE NEGATIVE Final    Comment: (NOTE) SARS-CoV-2 target nucleic acids are NOT DETECTED.  The SARS-CoV-2 RNA is generally detectable in upper respiratory specimens during the acute phase of infection. The lowest concentration of SARS-CoV-2 viral copies this assay can detect is 138 copies/mL. A negative result does not preclude SARS-Cov-2 infection and should not be used as the sole basis for treatment or other patient management decisions. A negative result may occur with  improper specimen collection/handling, submission of specimen other than nasopharyngeal swab, presence of viral mutation(s) within the areas targeted by this assay, and inadequate number of viral copies(<138 copies/mL). A negative result must be combined with clinical observations, patient history, and epidemiological information. The expected result is Negative.  Fact Sheet for Patients:  EntrepreneurPulse.com.au  Fact Sheet for Healthcare Providers:  IncredibleEmployment.be  This test is no t yet approved or cleared by the [place] FDA and  has been authorized for detection and/or diagnosis of SARS-CoV-2 by FDA under an Emergency Use Authorization (EUA). This EUA will remain  in effect (meaning this test can be used) for the duration of the COVID-19 declaration under Section 564(b)(1) of the Act, 21 U.S.C.section 360bbb-3(b)(1), unless the authorization is terminated  or revoked sooner.       Influenza A by PCR NEGATIVE NEGATIVE Final   Influenza B by PCR NEGATIVE NEGATIVE Final    Comment: (NOTE) The Xpert Xpress SARS-CoV-2/FLU/RSV plus assay is intended as an aid in the diagnosis of influenza from Nasopharyngeal swab specimens and should not be  used as a sole basis for treatment. Nasal washings and aspirates are unacceptable for Xpert Xpress SARS-CoV-2/FLU/RSV testing.  Fact Sheet for Patients: EntrepreneurPulse.com.au  Fact Sheet for Healthcare Providers: IncredibleEmployment.be  This test is not yet approved or cleared by the [place] FDA and has been authorized for detection and/or diagnosis of SARS-CoV-2 by FDA under an Emergency Use Authorization (EUA). This EUA will remain in effect (meaning this test can be used) for the duration of the COVID-19 declaration under Section 564(b)(1) of the Act, 21 U.S.C. section 360bbb-3(b)(1), unless the authorization is terminated or revoked.  Performed at [facility], [address], [place], [place] [address]  Scheduled Meds: . amiodarone  200 mg Oral Daily  . budesonide (PULMICORT) nebulizer solution  0.5 mg Nebulization BID  . Chlorhexidine Gluconate Cloth  6 each Topical Daily  . insulin aspart  0-15 Units Subcutaneous TID WC  . insulin aspart  0-5 Units Subcutaneous QHS  . ipratropium-albuterol  3 mL Nebulization Q6H  . methylPREDNISolone (SOLU-MEDROL) injection  60 mg Intravenous Q12H  . pantoprazole  40 mg Oral Daily  . sertraline  50 mg Oral QHS  . tamsulosin  0.4 mg Oral Daily  . Warfarin - Pharmacist Dosing Inpatient   Does not apply q1600   Continuous Infusions:  Procedures/Studies: CT CHEST WO CONTRAST  Result Date: 08/01/2020 CLINICAL DATA:  Cough.  Shortness of breath.  Pneumonia. EXAM: CT CHEST WITHOUT CONTRAST TECHNIQUE: Multidetector CT imaging of the chest was performed following the standard protocol without IV contrast. COMPARISON:  Chest radiograph 08/01/2020 and chest CT from 04/18/2020 FINDINGS: Cardiovascular: Coronary, aortic arch, and branch vessel atherosclerotic vascular disease. Mild cardiomegaly. Prior CABG. Mediastinum/Nodes: Scattered mediastinal lymph nodes are present. 1.0 cm right lower  paratracheal node on image 41 of series 2, previously 1.4 cm. Lungs/Pleura: Tracheostomy tube in place. Emphysema. Hazy ground-glass opacities throughout both lungs, but substantially improved from 04/18/2020, possibly from improving pulmonary edema. Bandlike densities tracking in both lower lobes and to a lesser extent in both upper lobes with associated volume loss, probably from atelectasis. Upper Abdomen: Cholelithiasis.  Bilateral adrenal adenomas. Musculoskeletal: Stable T9 compression fracture. IMPRESSION: 1. Hazy ground-glass opacities throughout both lungs, but substantially improved from 04/18/2020, possibly from improving pulmonary edema. 2. Bandlike densities tracking in both lower lobes and to a lesser extent in both upper lobes with associated volume loss, probably from atelectasis. 3. Other imaging findings of potential clinical significance: Coronary, aortic arch, and branch vessel atherosclerotic vascular disease. Mild cardiomegaly. Prior CABG. Cholelithiasis. Bilateral adrenal adenomas. Stable T9 compression fracture. 4. Emphysema and aortic atherosclerosis. Aortic Atherosclerosis (ICD10-I70.0) and Emphysema (ICD10-J43.9). Electronically Signed   By: Van Clines M.D.   On: 08/01/2020 17:10   DG Chest Port 1 View  Result Date: 07/28/2020 CLINICAL DATA:  Shortness of breath. EXAM: PORTABLE CHEST 1 VIEW COMPARISON:  July 21, 2020. FINDINGS: Stable cardiomegaly. Tracheostomy tube is in good position. Status post coronary bypass graft. No pneumothorax is noted. Mild bibasilar subsegmental atelectasis is noted with small pleural effusions. Bony thorax is unremarkable. IMPRESSION: Mild bibasilar subsegmental atelectasis with small pleural effusions. Electronically Signed   By: Marijo Conception M.D.   On: 07/28/2020 09:31   DG Chest Port 1 View  Result Date: 07/21/2020 CLINICAL DATA:  Shortness of breath EXAM: PORTABLE CHEST 1 VIEW COMPARISON:  07/03/2020 FINDINGS: Tracheostomy tube  remains in place. Post CABG changes. Stable cardiomegaly. There is pulmonary vascular congestion with diffuse bilateral interstitial prominence. Streaky perihilar and bibasilar opacities. No large pleural fluid collection. No pneumothorax. IMPRESSION: Findings suggestive of CHF with pulmonary edema. Streaky perihilar and bibasilar opacities may represent atelectasis, alveolar edema, versus infection. Electronically Signed   By: Davina Poke D.O.   On: 07/21/2020 11:46   EEG adult  Result Date: 08/01/2020 Lora Havens, MD     08/01/2020  4:38 PM Patient Name: Theresa [name] MRN: [id number] Epilepsy Attending: Lora Havens Referring Physician/Provider: Dr Shanon Brow Aubrie Lucien Date: 08/01/2020 Duration: 23.43 mins Patient history: 54yo with an episode of near syncope. EEG to evaluate for seizure. Level of alertness: Awake AEDs during EEG study: None Technical aspects: This EEG study was done with scalp  electrodes positioned according to the 10-20 International system of electrode placement. Electrical activity was acquired at a sampling rate of 500Hz  and reviewed with a high frequency filter of 70Hz  and a low frequency filter of 1Hz . EEG data were recorded continuously and digitally stored. Description: The posterior dominant rhythm consists of 8 Hz activity of moderate voltage (25-35 uV) seen predominantly in posterior head regions, symmetric and reactive to eye opening and eye closing. EEG showed intermittent generalized 3 to 6 Hz theta-delta slowing. Hyperventilation and photic stimulation were not performed.   ABNORMALITY -Intermittent slow, generalized IMPRESSION: This study is suggestive of mild diffuse encephalopathy, nonspecific etiology. No seizures or epileptiform discharges were seen throughout the recording. Priyanka O Yadav   [place] EKG SITE RITE  Result Date: 08/02/2020 If Site Rite image not attached, placement could not be confirmed due to current cardiac rhythm.   Orson Eva, DO  [facility]  If 7PM-7AM, please contact night-coverage [email or web address] Password [email or web address] 08/02/2020, 12:52 PM   LOS: 1 day

## 2020-08-02 NOTE — Progress Notes (Signed)
Called vascular wellness to get PICC placed. They will call back with an ETA.

## 2020-08-03 DIAGNOSIS — N179 Acute kidney failure, unspecified: Secondary | ICD-10-CM | POA: Diagnosis not present

## 2020-08-03 DIAGNOSIS — Z720 Tobacco use: Secondary | ICD-10-CM | POA: Diagnosis not present

## 2020-08-03 DIAGNOSIS — D6862 Lupus anticoagulant syndrome: Secondary | ICD-10-CM | POA: Diagnosis not present

## 2020-08-03 DIAGNOSIS — I5042 Chronic combined systolic (congestive) and diastolic (congestive) heart failure: Secondary | ICD-10-CM | POA: Diagnosis not present

## 2020-08-03 LAB — GLUCOSE, CAPILLARY
Glucose-Capillary: 357 mg/dL — ABNORMAL HIGH (ref 70–99)
Glucose-Capillary: 346 mg/dL — ABNORMAL HIGH (ref 70–99)
Glucose-Capillary: 50 mg/dL — ABNORMAL LOW (ref 70–99)
Glucose-Capillary: 99 mg/dL (ref 70–99)
Glucose-Capillary: 165 mg/dL — ABNORMAL HIGH (ref 70–99)

## 2020-08-03 LAB — COMPREHENSIVE METABOLIC PANEL
ALT: 25 U/L (ref 0–44)
AST: 20 U/L (ref 15–41)
Albumin: 3.1 g/dL — ABNORMAL LOW (ref 3.5–5.0)
Alkaline Phosphatase: 79 U/L (ref 38–126)
Anion gap: 16 — ABNORMAL HIGH (ref 5–15)
BUN: 83 mg/dL — ABNORMAL HIGH (ref 6–20)
CO2: 37 mmol/L — ABNORMAL HIGH (ref 22–32)
Calcium: 8.4 mg/dL — ABNORMAL LOW (ref 8.9–10.3)
Chloride: 79 mmol/L — ABNORMAL LOW (ref 98–111)
Creatinine, Ser: 1.49 mg/dL — ABNORMAL HIGH (ref 0.44–1.00)
GFR, Estimated: 41 mL/min — ABNORMAL LOW (ref >60)
Glucose, Bld: 359 mg/dL — ABNORMAL HIGH (ref 70–99)
Potassium: 3.6 mmol/L (ref 3.5–5.1)
Sodium: 132 mmol/L — ABNORMAL LOW (ref 135–145)
Total Bilirubin: 1.9 mg/dL — ABNORMAL HIGH (ref 0.3–1.2)
Total Protein: 6.6 g/dL (ref 6.5–8.1)

## 2020-08-03 LAB — CBC
HCT: 44.2 % (ref 36.0–46.0)
Hemoglobin: 14.1 g/dL (ref 12.0–15.0)
MCH: 31.3 pg (ref 26.0–34.0)
MCHC: 31.9 g/dL (ref 30.0–36.0)
MCV: 98 fL (ref 80.0–100.0)
Platelets: 241 10*3/uL (ref 150–400)
RBC: 4.51 MIL/uL (ref 3.87–5.11)
RDW: 15.4 % (ref 11.5–15.5)
WBC: 16.3 10*3/uL — ABNORMAL HIGH (ref 4.0–10.5)
nRBC: 0 % (ref 0.0–0.2)

## 2020-08-03 LAB — PROTIME-INR
INR: 3.7 — ABNORMAL HIGH (ref 0.8–1.2)
Prothrombin Time: 35.7 seconds — ABNORMAL HIGH (ref 11.4–15.2)

## 2020-08-03 LAB — PROCALCITONIN: Procalcitonin: <0.1 ng/mL

## 2020-08-03 MED ORDER — INSULIN GLARGINE 100 UNIT/ML ~~LOC~~ SOLN
10.0000 [IU] | Freq: Every day | SUBCUTANEOUS | Status: DC
  Administered 2020-08-03 – 2020-08-05 (×3): 10 [IU] via SUBCUTANEOUS
  Filled 2020-08-03 (×4): qty 0.1

## 2020-08-03 MED ORDER — HYDROCORTISONE ACETATE 25 MG RE SUPP
25.0000 mg | Freq: Two times a day (BID) | RECTAL | Status: DC
  Administered 2020-08-03 – 2020-08-07 (×9): 25 mg via RECTAL
  Filled 2020-08-03 (×9): qty 1

## 2020-08-03 NOTE — Progress Notes (Signed)
ANTICOAGULATION CONSULT NOTE   Pharmacy Consult for Warfarin Indication: lupus anticoagulant  Syndrome/afib   Patient Measurements: Height: 4\' 10"  (147.3 cm) Weight: 93.3 kg (205 lb 11 oz) IBW/kg (Calculated) : 40.9  Vital Signs: Temp: 97.8 F (36.6 C) (11/28 0744) Temp Source: Oral (11/28 0744) BP: 162/140 (11/28 0600) Pulse Rate: 97 (11/28 0900)  Labs: Recent Labs    08/01/20 1251 08/01/20 1251 08/02/20 0427 08/03/20 0446  HGB 15.9*   < > 15.7* 14.1  HCT 48.9*  --  48.4* 44.2  PLT 245  --  232 241  LABPROT 27.9*  --  27.9* 35.7*  INR 2.7*  --  2.7* 3.7*  CREATININE 1.47*  --  1.53* 1.49*   < > = values in this interval not displayed.    Estimated Creatinine Clearance: 42.2 mL/min (A) (by C-G formula based on SCr of 1.49 mg/dL (H)).  Assessment: 54 yr old female presented to [facility] ED with SOB. Medical hx includes lupus anticoagulant with hx of VTE, for which she is on anticoagulation with warfarin PTA (5 mg MWF, 2.5 mg other days of the week; per med rec, last dose was on the evening of 07/31/20). Pt also has recent dx of atrial flutter.  Was started on amiodarone recently on last admission.   Major Drug Interaction: Target a 25- 50% reduction in weekly warfarin dose after 2 weeks of dual therapy with amiodarone and warfarin  INR 2.7 on admission - Last dose 11/25 per patient CBC WNL  INR 3.7 today, supratherapeutic   Goal of Therapy:  INR 2-3 Monitor platelets by anticoagulation protocol: Yes   Plan:  Hold warfarin x 1 dose tonight  Consider weekly dose reduction of warfarin d/t major interaction w/ amiodarone Daily INR and CBC Monitor for signs/symptoms of bleeding   Thomasenia Sales, PharmD, MBA, BCGP Clinical Pharmacist   08/03/2020 11:16 AM

## 2020-08-03 NOTE — Progress Notes (Addendum)
PROGRESS NOTE  Theresa [name] TIR:[id number] DOB: Apr 28, 1966 DOA: 08/01/2020 PCP: Monico Blitz, MD  Brief History:  54 year old female with systolic and diastolic CHF, COPD, continued tobacco abuse, diabetes mellitus type 2, lupus anticoagulant syndrome, coronary artery disease with history of STEMI, hypertension, chronic respiratory failure on 4 L, chronic tracheostomy presenting with2day history of dizziness, worsening generalized weakness, and near syncope. Notably, the patient was recently admitted to the hospital from 07/21/2020 to 07/29/2020 when she was treated for acute on chronic respiratory failure secondary to decompensated CHF. The patient was discharged home with Bumex 6 mg in the morning and 4 mg in the evening. It appeared that her discharge weight was 93.3 kg (205.7 lbs). The patient endorsed compliance with her Bumex at home. However she continued to have poor oral intake. She stated that she has had episodes of dizziness and generalized weakness requiring increasing help from her family. She was try to get up off the commode and was so weak she needed help. Her family was not able to help her so EMS was activated. Since discharge from the hospital, the patient denies any worsening shortness of breath. She denied any fevers, chills, chest pain, hemoptysis, nausea, vomiting, diarrhea, abdominal pain, headache, visual disturbance, focal extremity weakness. She denies any dysuria, hematuria, hematochezia, melena. She states that she has had episodes of near syncope where she describes the her visual field becomes black for short period of time. This can usually happen even when she is at rest watching television. EMS was activated, and the patient was taken to [facility].BMP showed a sodium 133, potassium 4.1, chloride 87, CO2 41, BUN 86, creatinine 1.76. WBC 19.6, hemoglobin 16.3, platelets 262,000. Troponin 73>>77. LFTs were unremarkable. Magnesium was 2.9. Chest  x-ray showed bilateral patchy infiltrates. The patient requested transfer to [facility] for further evaluation and treatment.   Assessment/Plan: Acute on chronic respiratory failure with hypoxia -due to COPD exacerbation -usually on 4L trach collar at home -currently on 8L trach collar (40% FiO2)>>5L -wean oxygen as tolerated back to baseline -11/27 CT chest--Hazy b/L GGO improving vs 04/18/20; bandlike densities upper and lower lobes; stable T9 compression fx -BNP 344 -PCT <0.10 x 2  COPD Exacerbation -continue solumedrol to 80 mg IV q 12hours -continue duonebs -continue pulmicort -continue brovana -11/26 evening--pt had mucus plug-->respiratory distress and moved to stepdown unit  Rectal Pain -suspect proctitis from abundant stool in rectal vault -rectal exam--no fissure, no visible lesion, abundant stool in rectal vault;  Hemoccult negative -try anusol suppositories   Generalized weakness -This is likely multifactorial including acute on chronic renal failure secondary to her diuresis -Obtain UA--no pyuria -QPY--1.950 -D32--671 -Folic IWPY--09.9 -PT evaluation  Acute on chronic renal failure--CKD stage IIIa -Baseline creatinine 1.0-1.2 -serum creatinine peaked 1.76 at [facility] on 11/25 -Holding Bumex for today and reevaluate for restart 08/04/2020  Atrial flutter -Remain on telemetry -Currently in sinus rhythm -Continue amiodarone  Chronic systolic and diastolic CHF -8/33/8250 echo EF 40-45%, grade 3 DD -07/02/20 echo EF 50%, global HK, indeterminant diastolic -holding bumex today and reeval tomorrow for restart -clinically euvolemic  Lupus anticoagulant syndrome -coumadin with pharmacy assistance  Coronary artery disease -No chest pain presently  Uncontrolled diabetes mellitus type 2 with hyperglycemia -restart lantus as pt now eating better -NovoLog sliding scale -06/16/20-hemoglobin A1c--8.9 -08/01/20 A1c--8.2 -holding  Ozempic  Depression -Continue sertraline and home dose lorazepam  Bladder spasm -this is likely the etiology her vague vaginal pain -d/c  foley catheter        Status is: Inpatient  Remains inpatient appropriate because:IV treatments appropriate due to intensity of illness or inability to take PO   Dispo: The patient is from: Home  Anticipated d/c is to: Home  Anticipated d/c date is: 2 days  Patient currently is not medically stable to d/c.        Family Communication:   Spouse updated at bedside 11/27  Consultants:  none  Code Status:  FULL  DVT Prophylaxis:  warfarin   Procedures: As Listed in Progress Note Above  Antibiotics: None     Subjective: Patient complains of rectal pain and pubic pain.  She is breathing better.  Denies f/c, cp, sob, n/v/d, abd pain.  Had BM without blood  Objective: Vitals:   08/03/20 0800 08/03/20 0816 08/03/20 0900 08/03/20 1153  BP:      Pulse: 88  97   Resp: (!) 24  14   Temp:    (!) 97.5 F (36.4 C)  TempSrc:    Oral  SpO2: 91% 94% 96%   Weight:      Height:        Intake/Output Summary (Last 24 hours) at 08/03/2020 1424 Last data filed at 08/02/2020 2300 Gross per 24 hour  Intake 420 ml  Output 400 ml  Net 20 ml   Weight change:  Exam:   General:  Pt is alert, follows commands appropriately, not in acute distress  HEENT: No icterus, No thrush, No neck mass, [place]/AT  Cardiovascular: RRR, S1/S2, no rubs, no gallops  Respiratory: bilateral rales.  Bilateral exp wheeze  Abdomen: Soft/+BS, non tender, non distended, no guarding  Extremities: No edema, No lymphangitis, No petechiae, No rashes, no synovitis   Data Reviewed: I have personally reviewed following labs and imaging studies Basic Metabolic Panel: Recent Labs  Lab 07/28/20 0641 07/29/20 0534 08/01/20 1251 08/02/20 0427 08/03/20 0446  NA 139 136 133* 134* 132*  K 4.1 3.7  3.7 4.0 3.6  CL 86* 80* 78* 80* 79*  CO2 42* 43* 40* 38* 37*  GLUCOSE 93 99 76 172* 359*  BUN 47* 61* 85* 85* 83*  CREATININE 1.15* 1.34* 1.47* 1.53* 1.49*  CALCIUM 8.9 9.4 8.7* 8.5* 8.4*  MG 2.5* 2.5*  --   --   --    Liver Function Tests: Recent Labs  Lab 08/01/20 1251 08/03/20 0446  AST 22 20  ALT 27 25  ALKPHOS 91 79  BILITOT 1.6* 1.9*  PROT 7.1 6.6  ALBUMIN 3.6 3.1*   No results for input(s): LIPASE, AMYLASE in the last 168 hours. No results for input(s): AMMONIA in the last 168 hours. Coagulation Profile: Recent Labs  Lab 07/28/20 0641 07/29/20 0534 08/01/20 1251 08/02/20 0427 08/03/20 0446  INR 1.9* 1.8* 2.7* 2.7* 3.7*   CBC: Recent Labs  Lab 07/29/20 0534 08/01/20 1251 08/02/20 0427 08/03/20 0446  WBC 13.2* 20.0* 15.8* 16.3*  HGB 15.2* 15.9* 15.7* 14.1  HCT 47.2* 48.9* 48.4* 44.2  MCV 96.9 96.6 97.4 98.0  PLT 274 245 232 241   Cardiac Enzymes: No results for input(s): CKTOTAL, CKMB, CKMBINDEX, TROPONINI in the last 168 hours. BNP: Invalid input(s): POCBNP CBG: Recent Labs  Lab 08/02/20 1122 08/02/20 1639 08/02/20 2243 08/03/20 0731 08/03/20 1135  GLUCAP 243* 126* 159* 357* 346*   HbA1C: Recent Labs    08/01/20 1251  HGBA1C 8.2*   Urine analysis:    Component Value Date/Time   COLORURINE YELLOW 08/01/2020 1539  APPEARANCEUR CLEAR 08/01/2020 1539   LABSPEC 1.012 08/01/2020 1539   PHURINE 6.0 08/01/2020 1539   GLUCOSEU NEGATIVE 08/01/2020 1539   HGBUR MODERATE (A) 08/01/2020 1539   BILIRUBINUR NEGATIVE 08/01/2020 [place] 08/01/2020 1539   PROTEINUR 30 (A) 08/01/2020 1539   NITRITE NEGATIVE 08/01/2020 1539   LEUKOCYTESUR NEGATIVE 08/01/2020 1539   Sepsis Labs: @LABRCNTIP (procalcitonin:4,lacticidven:4) ) Recent Results (from the past 240 hour(s))  Culture, Urine     Status: Abnormal (Preliminary result)   Collection Time: 08/01/20  3:39 PM   Specimen: Urine, Clean Catch  Result Value Ref Range Status    Specimen Description   Final    URINE, CLEAN CATCH Performed at [facility], [address] [place], [place] [address]    Special Requests   Final    NONE Performed at [facility], [address] [place], [place] [address]    Culture (A)  Final    >=100,000 COLONIES/mL ENTEROCOCCUS FAECALIS SUSCEPTIBILITIES TO FOLLOW Performed at [facility] [address] [place], [place] [address]    Report Status PENDING  Incomplete  MRSA PCR Screening     Status: None   Collection Time: 08/01/20  6:05 PM   Specimen: Nasopharyngeal  Result Value Ref Range Status   MRSA by PCR NEGATIVE NEGATIVE Final    Comment:        The GeneXpert MRSA Assay (FDA approved for NASAL specimens only), is one component of a comprehensive MRSA colonization surveillance program. It is not intended to diagnose MRSA infection nor to guide or monitor treatment for MRSA infections. Performed at [facility], [address], [place],  [address]   Resp Panel by RT-PCR (Flu A&B, Covid) Nasopharyngeal Swab     Status: None   Collection Time: 08/01/20  6:05 PM   Specimen: Nasopharyngeal Swab; Nasopharyngeal(NP) swabs in vial transport medium  Result Value Ref Range Status   SARS Coronavirus 2 by RT PCR NEGATIVE NEGATIVE Final    Comment: (NOTE) SARS-CoV-2 target nucleic acids are NOT DETECTED.  The SARS-CoV-2 RNA is generally detectable in upper respiratory specimens during the acute phase of infection. The lowest concentration of SARS-CoV-2 viral copies this assay can detect is 138 copies/mL. A negative result does not preclude SARS-Cov-2 infection and should not be used as the sole basis for treatment or other patient management decisions. A negative result may occur with  improper specimen collection/handling, submission of specimen other than nasopharyngeal swab, presence of viral mutation(s) within the areas targeted by this assay, and inadequate number of viral copies(<138 copies/mL). A  negative result must be combined with clinical observations, patient history, and epidemiological information. The expected result is Negative.  Fact Sheet for Patients:  EntrepreneurPulse.com.au  Fact Sheet for Healthcare Providers:  IncredibleEmployment.be  This test is no t yet approved or cleared by the [place] FDA and  has been authorized for detection and/or diagnosis of SARS-CoV-2 by FDA under an Emergency Use Authorization (EUA). This EUA will remain  in effect (meaning this test can be used) for the duration of the COVID-19 declaration under Section 564(b)(1) of the Act, 21 U.S.C.section 360bbb-3(b)(1), unless the authorization is terminated  or revoked sooner.       Influenza A by PCR NEGATIVE NEGATIVE Final   Influenza B by PCR NEGATIVE NEGATIVE Final    Comment: (NOTE) The Xpert Xpress SARS-CoV-2/FLU/RSV plus assay is intended as an aid in the diagnosis of influenza from Nasopharyngeal swab specimens and should not be used as  a sole basis for treatment. Nasal washings and aspirates are unacceptable for Xpert Xpress SARS-CoV-2/FLU/RSV testing.  Fact Sheet for Patients: EntrepreneurPulse.com.au  Fact Sheet for Healthcare Providers: IncredibleEmployment.be  This test is not yet approved or cleared by the [place] FDA and has been authorized for detection and/or diagnosis of SARS-CoV-2 by FDA under an Emergency Use Authorization (EUA). This EUA will remain in effect (meaning this test can be used) for the duration of the COVID-19 declaration under Section 564(b)(1) of the Act, 21 U.S.C. section 360bbb-3(b)(1), unless the authorization is terminated or revoked.  Performed at [facility], [address], [place], [place] [address]      Scheduled Meds: . amiodarone  200 mg Oral Daily  . arformoterol  15 mcg Nebulization BID  . budesonide (PULMICORT) nebulizer solution  0.5 mg  Nebulization BID  . Chlorhexidine Gluconate Cloth  6 each Topical Daily  . insulin aspart  0-15 Units Subcutaneous TID WC  . insulin aspart  0-5 Units Subcutaneous QHS  . ipratropium-albuterol  3 mL Nebulization Q6H  . methylPREDNISolone (SOLU-MEDROL) injection  80 mg Intravenous Q12H  . pantoprazole  40 mg Oral Daily  . sertraline  50 mg Oral QHS  . tamsulosin  0.4 mg Oral Daily  . Warfarin - Pharmacist Dosing Inpatient   Does not apply q1600   Continuous Infusions:  Procedures/Studies: CT CHEST WO CONTRAST  Result Date: 08/01/2020 CLINICAL DATA:  Cough.  Shortness of breath.  Pneumonia. EXAM: CT CHEST WITHOUT CONTRAST TECHNIQUE: Multidetector CT imaging of the chest was performed following the standard protocol without IV contrast. COMPARISON:  Chest radiograph 08/01/2020 and chest CT from 04/18/2020 FINDINGS: Cardiovascular: Coronary, aortic arch, and branch vessel atherosclerotic vascular disease. Mild cardiomegaly. Prior CABG. Mediastinum/Nodes: Scattered mediastinal lymph nodes are present. 1.0 cm right lower paratracheal node on image 41 of series 2, previously 1.4 cm. Lungs/Pleura: Tracheostomy tube in place. Emphysema. Hazy ground-glass opacities throughout both lungs, but substantially improved from 04/18/2020, possibly from improving pulmonary edema. Bandlike densities tracking in both lower lobes and to a lesser extent in both upper lobes with associated volume loss, probably from atelectasis. Upper Abdomen: Cholelithiasis.  Bilateral adrenal adenomas. Musculoskeletal: Stable T9 compression fracture. IMPRESSION: 1. Hazy ground-glass opacities throughout both lungs, but substantially improved from 04/18/2020, possibly from improving pulmonary edema. 2. Bandlike densities tracking in both lower lobes and to a lesser extent in both upper lobes with associated volume loss, probably from atelectasis. 3. Other imaging findings of potential clinical significance: Coronary, aortic arch, and  branch vessel atherosclerotic vascular disease. Mild cardiomegaly. Prior CABG. Cholelithiasis. Bilateral adrenal adenomas. Stable T9 compression fracture. 4. Emphysema and aortic atherosclerosis. Aortic Atherosclerosis (ICD10-I70.0) and Emphysema (ICD10-J43.9). Electronically Signed   By: Van Clines M.D.   On: 08/01/2020 17:10   DG CHEST PORT 1 VIEW  Result Date: 08/02/2020 CLINICAL DATA:  PICC line placement EXAM: PORTABLE CHEST 1 VIEW COMPARISON:  November 26 21 FINDINGS: The heart size remains enlarged. There are improving airspace opacities bilaterally. Band like opacities are again noted bilaterally favored to represent areas of atelectasis. There is no pneumothorax. Postsurgical changes are noted over the right lower lung zone. There is a probable right-sided pleural effusion. The endotracheal tube terminates above the carina by approximately 4 cm. The left-sided PICC line projects over the right atrium and should be retracted by approximately 4 cm. IMPRESSION: 1. The left-sided PICC line tip projects over the right atrium and should be retracted by approximately 4 cm. 2. Remaining lines and tubes as  detailed above. 3. Improving aeration throughout both lungs. These results will be called to the ordering clinician or representative by the Radiologist Assistant, and communication documented in the PACS or [employer]. Electronically Signed   By: Constance Holster M.D.   On: 08/02/2020 19:20   DG Chest Port 1 View  Result Date: 07/28/2020 CLINICAL DATA:  Shortness of breath. EXAM: PORTABLE CHEST 1 VIEW COMPARISON:  July 21, 2020. FINDINGS: Stable cardiomegaly. Tracheostomy tube is in good position. Status post coronary bypass graft. No pneumothorax is noted. Mild bibasilar subsegmental atelectasis is noted with small pleural effusions. Bony thorax is unremarkable. IMPRESSION: Mild bibasilar subsegmental atelectasis with small pleural effusions. Electronically Signed   By: Marijo Conception M.D.   On: 07/28/2020 09:31   DG Chest Port 1 View  Result Date: 07/21/2020 CLINICAL DATA:  Shortness of breath EXAM: PORTABLE CHEST 1 VIEW COMPARISON:  07/03/2020 FINDINGS: Tracheostomy tube remains in place. Post CABG changes. Stable cardiomegaly. There is pulmonary vascular congestion with diffuse bilateral interstitial prominence. Streaky perihilar and bibasilar opacities. No large pleural fluid collection. No pneumothorax. IMPRESSION: Findings suggestive of CHF with pulmonary edema. Streaky perihilar and bibasilar opacities may represent atelectasis, alveolar edema, versus infection. Electronically Signed   By: Davina Poke D.O.   On: 07/21/2020 11:46   EEG adult  Result Date: 08/01/2020 Lora Havens, MD     08/01/2020  4:38 PM Patient Name: RHILEE CURRIN MRN: [id number] Epilepsy Attending: Lora Havens Referring Physician/Provider: Dr Shanon Brow Domonic Kimball Date: 08/01/2020 Duration: 23.43 mins Patient history: 54yo with an episode of near syncope. EEG to evaluate for seizure. Level of alertness: Awake AEDs during EEG study: None Technical aspects: This EEG study was done with scalp electrodes positioned according to the 10-20 International system of electrode placement. Electrical activity was acquired at a sampling rate of 500Hz  and reviewed with a high frequency filter of 70Hz  and a low frequency filter of 1Hz . EEG data were recorded continuously and digitally stored. Description: The posterior dominant rhythm consists of 8 Hz activity of moderate voltage (25-35 uV) seen predominantly in posterior head regions, symmetric and reactive to eye opening and eye closing. EEG showed intermittent generalized 3 to 6 Hz theta-delta slowing. Hyperventilation and photic stimulation were not performed.   ABNORMALITY -Intermittent slow, generalized IMPRESSION: This study is suggestive of mild diffuse encephalopathy, nonspecific etiology. No seizures or epileptiform discharges were seen throughout the  recording. Priyanka O Yadav   [place] EKG SITE RITE  Result Date: 08/02/2020 If Site Rite image not attached, placement could not be confirmed due to current cardiac rhythm.   Orson Eva, DO  [facility]  If 7PM-7AM, please contact night-coverage [email or web address] Password [email or web address] 08/03/2020, 2:24 PM   LOS: 2 days

## 2020-08-04 ENCOUNTER — Encounter (HOSPITAL_COMMUNITY): Payer: Self-pay | Admitting: Internal Medicine

## 2020-08-04 DIAGNOSIS — R55 Syncope and collapse: Secondary | ICD-10-CM | POA: Diagnosis not present

## 2020-08-04 LAB — MAGNESIUM: Magnesium: 2.6 mg/dL — ABNORMAL HIGH (ref 1.7–2.4)

## 2020-08-04 LAB — BASIC METABOLIC PANEL
Anion gap: 16 — ABNORMAL HIGH (ref 5–15)
BUN: 79 mg/dL — ABNORMAL HIGH (ref 6–20)
CO2: 36 mmol/L — ABNORMAL HIGH (ref 22–32)
Calcium: 8.7 mg/dL — ABNORMAL LOW (ref 8.9–10.3)
Chloride: 78 mmol/L — ABNORMAL LOW (ref 98–111)
Creatinine, Ser: 1.41 mg/dL — ABNORMAL HIGH (ref 0.44–1.00)
GFR, Estimated: 44 mL/min — ABNORMAL LOW (ref >60)
Glucose, Bld: 412 mg/dL — ABNORMAL HIGH (ref 70–99)
Potassium: 3.3 mmol/L — ABNORMAL LOW (ref 3.5–5.1)
Sodium: 130 mmol/L — ABNORMAL LOW (ref 135–145)

## 2020-08-04 LAB — CBC
HCT: 42.5 % (ref 36.0–46.0)
Hemoglobin: 13.8 g/dL (ref 12.0–15.0)
MCH: 31.6 pg (ref 26.0–34.0)
MCHC: 32.5 g/dL (ref 30.0–36.0)
MCV: 97.3 fL (ref 80.0–100.0)
Platelets: 269 10*3/uL (ref 150–400)
RBC: 4.37 MIL/uL (ref 3.87–5.11)
RDW: 15.2 % (ref 11.5–15.5)
WBC: 19.2 10*3/uL — ABNORMAL HIGH (ref 4.0–10.5)
nRBC: 0 % (ref 0.0–0.2)

## 2020-08-04 LAB — GLUCOSE, CAPILLARY
Glucose-Capillary: 377 mg/dL — ABNORMAL HIGH (ref 70–99)
Glucose-Capillary: 358 mg/dL — ABNORMAL HIGH (ref 70–99)
Glucose-Capillary: 67 mg/dL — ABNORMAL LOW (ref 70–99)
Glucose-Capillary: 66 mg/dL — ABNORMAL LOW (ref 70–99)
Glucose-Capillary: 194 mg/dL — ABNORMAL HIGH (ref 70–99)
Glucose-Capillary: 242 mg/dL — ABNORMAL HIGH (ref 70–99)

## 2020-08-04 LAB — PROTIME-INR
INR: 4.7 (ref 0.8–1.2)
Prothrombin Time: 43 seconds — ABNORMAL HIGH (ref 11.4–15.2)

## 2020-08-04 LAB — URINE CULTURE: Culture: >=100000 — AB

## 2020-08-04 MED ORDER — POLYETHYLENE GLYCOL 3350 17 G PO PACK
17.0000 g | PACK | Freq: Every day | ORAL | Status: DC
  Administered 2020-08-04 – 2020-08-05 (×2): 17 g via ORAL
  Filled 2020-08-04: qty 1

## 2020-08-04 MED ORDER — SODIUM CHLORIDE 0.9% IV SOLUTION: Freq: Once | INTRAVENOUS | Status: AC

## 2020-08-04 MED ORDER — POTASSIUM CHLORIDE CRYS ER 20 MEQ PO TBCR
40.0000 meq | EXTENDED_RELEASE_TABLET | Freq: Once | ORAL | Status: AC
  Administered 2020-08-04: 40 meq via ORAL
  Filled 2020-08-04: qty 2

## 2020-08-04 MED ORDER — PHYTONADIONE 5 MG PO TABS
2.5000 mg | ORAL_TABLET | Freq: Once | ORAL | Status: AC
  Administered 2020-08-04: 2.5 mg via ORAL
  Filled 2020-08-04: qty 1

## 2020-08-04 NOTE — Progress Notes (Signed)
ANTICOAGULATION CONSULT NOTE   Pharmacy Consult for Warfarin Indication: lupus anticoagulant  Syndrome/afib   Patient Measurements: Height: 4\' 10"  (147.3 cm) Weight: 93.4 kg (205 lb 14.6 oz) IBW/kg (Calculated) : 40.9  Vital Signs: Temp: 98.3 F (36.8 C) (11/29 0728) Temp Source: Oral (11/29 0728) BP: 120/65 (11/29 0700) Pulse Rate: 107 (11/29 0728)  Labs: Recent Labs    08/02/20 0427 08/02/20 0427 08/03/20 0446 08/04/20 0534  HGB 15.7*   < > 14.1 13.8  HCT 48.4*  --  44.2 42.5  PLT 232  --  241 269  LABPROT 27.9*  --  35.7* 43.0*  INR 2.7*  --  3.7* 4.7*  CREATININE 1.53*  --  1.49* 1.41*   < > = values in this interval not displayed.    Estimated Creatinine Clearance: 44.6 mL/min (A) (by C-G formula based on SCr of 1.41 mg/dL (H)).  Assessment: 54 yr old female presented to [facility] ED with SOB. Medical hx includes lupus anticoagulant with hx of VTE, for which she is on anticoagulation with warfarin PTA (5 mg MWF, 2.5 mg other days of the week; per med rec, last dose was on the evening of 07/31/20). Pt also has recent dx of atrial flutter.  Was started on amiodarone recently on last admission.   Major Drug Interaction: Target a 25- 50% reduction in weekly warfarin dose after 2 weeks of dual therapy with amiodarone and warfarin  INR 2.7 on admission - Last dose 11/25 per patient CBC WNL  INR 3.7>>4.7 today, supratherapeutic  Vitamin K 2.5 mg x 1 dose ordered  Goal of Therapy:  INR 2-3 Monitor platelets by anticoagulation protocol: Yes   Plan:  Vitamin K 2.5 mg PO x 1 dose. Hold warfarin x 1 dose tonight  Consider weekly dose reduction of warfarin d/t major interaction w/ amiodarone Daily INR and CBC Monitor for signs/symptoms of bleeding   Margot Ables, PharmD Clinical Pharmacist 08/04/2020 9:19 AM

## 2020-08-04 NOTE — Progress Notes (Addendum)
PROGRESS NOTE  Theresa [name]:[id number] DOB: 01-11-1966 DOA: 08/01/2020 PCP: Monico Blitz, MD  Brief History: 54 year old female with systolic and diastolic CHF, COPD, continued tobacco abuse, diabetes mellitus type 2, lupus anticoagulant syndrome, coronary artery disease with history of STEMI, hypertension, chronic respiratory failure on 4 L, chronic tracheostomy presenting with2day history of dizziness, worsening generalized weakness, and near syncope. Notably, the patient was recently admitted to the hospital from 07/21/2020 to 07/29/2020 when she was treated for acute on chronic respiratory failure secondary to decompensated CHF. The patient was discharged home with Bumex 6 mg in the morning and 4 mg in the evening. It appeared that her discharge weight was 93.3 kg (205.7 lbs). The patient endorsed compliance with her Bumex at home. However she continued to have poor oral intake. She stated that she has had episodes of dizziness and generalized weakness requiring increasing help from her family. She was try to get up off the commode and was so weak she needed help. Her family was not able to help her so EMS was activated. Since discharge from the hospital, the patient denies any worsening shortness of breath. She denied any fevers, chills, chest pain, hemoptysis, nausea, vomiting, diarrhea, abdominal pain, headache, visual disturbance, focal extremity weakness. She denies any dysuria, hematuria, hematochezia, melena. She states that she has had episodes of near syncope where she describes the her visual field becomes black for short period of time. This can usually happen even when she is at rest watching television. EMS was activated, and the patient was taken to [facility].BMP showed a sodium 133, potassium 4.1, chloride 87, CO2 41, BUN 86, creatinine 1.76. WBC 19.6, hemoglobin 16.3, platelets 262,000. Troponin 73>>77. LFTs were unremarkable. Magnesium was 2.9. Chest  x-ray showed bilateral patchy infiltrates. The patient requested transfer to [facility] for further evaluation and treatment.   Assessment/Plan: Acute on chronic respiratory failure with hypoxia -due to COPD exacerbation -usually on 4L trach collar at home -currently on 8L trach collar (40% FiO2)>>5L -wean oxygen as tolerated back to baseline -11/27 CT chest--Hazy b/L GGO improving vs 04/18/20; bandlike densities upper and lower lobes; stable T9 compression fx -BNP 344 -PCT <0.10 x 2  COPD Exacerbation -continue solumedrol to 80 mg IV q 12hours -continue duonebs -continue pulmicort -continue brovana -11/26 evening--pt had mucus plug-->respiratory distress and moved to stepdown unit -stable to move to floor  Rectal Pain/Hematochezia -suspect proctitis from abundant stool in rectal vault -rectal exam--no fissure, no visible lesion, abundant stool in rectal vault;  Hemoccult negative -try anusol suppositories  -GI consulted -hold warfarin  Supratherapeutic INR -vitamin K 2.5 mg x 1 -FFP x 1 in setting of active bleed today -hold warfarin  Generalized weakness -This is likely multifactorial including acute on chronic renal failure secondary to her diuresis -Obtain UA--no pyuria -EXB--2.841 -L24--401 -Folic UUVO--53.6 -PT evaluation--SNF  Acute on chronic renal failure--CKD stage IIIa -Baseline creatinine 1.0-1.2 -serum creatinine peaked 1.76 at [facility] on 11/25 -Holding Bumex for today and reevaluate for restart 08/05/2020  Atrial flutter -Remain on telemetry -going in and out of a flutter, rate controlled -Continue amiodarone  Chronic systolic and diastolic CHF -6/44/0347 echo EF 40-45%, grade 3 DD -07/02/20 echo EF 50%, global HK, indeterminant diastolic -holding bumex today and reeval tomorrow for restart -clinically euvolemic  Lupus anticoagulant syndrome -coumadin with pharmacy assistance -11/29 holding warfarin today  Coronary artery  disease -No chest pain presently  Uncontrolled diabetes mellitus type 2 with hyperglycemia -  restart lantus as pt now eating better -NovoLog sliding scale -06/16/20-hemoglobin A1c--8.9 -08/01/20 A1c--8.2 -holding Ozempic  Depression -Continue sertraline and home dose lorazepam  Bladder spasm -this is likely the etiology her vague vaginal pain -d/c foley catheter  Hyponatremia -due to liver cirrhosis        Status is: Inpatient  Remains inpatient appropriate because:IV treatments appropriate due to intensity of illness or inability to take PO   Dispo: The patient is from:Home Anticipated d/c is JZ:PHXT Anticipated d/c date is: 2 days Patient currently is not medically stable to d/c.        Family Communication:Spouse updatedat bedside 11/27  Consultants:none  Code Status: FULL  DVT Prophylaxis:warfarin   Procedures: As Listed in Progress Note Above  Antibiotics: None     Subjective: Patient states she is breathing better.  Denies f/c, cp, n/v/d or abd pain.  Had hematochezia today.  Objective: Vitals:   08/04/20 1500 08/04/20 1600 08/04/20 1628 08/04/20 1700  BP: 128/75 123/79  119/69  Pulse: 79 80 91 65  Resp: 17 (!) 21 (!) 23 18  Temp:   97.6 F (36.4 C)   TempSrc:   Oral   SpO2: (!) 88% 97% 94% 98%  Weight:      Height:        Intake/Output Summary (Last 24 hours) at 08/04/2020 1759 Last data filed at 08/04/2020 1500 Gross per 24 hour  Intake 490 ml  Output --  Net 490 ml   Weight change:  Exam:   General:  Pt is alert, follows commands appropriately, not in acute distress  HEENT: No icterus, No thrush, No neck mass, [place]/AT  Cardiovascular: RRR, S1/S2, no rubs, no gallops  Respiratory: diminished BS.  Bibasilar rales.  Bibasilar wheeze  Abdomen: Soft/+BS, non tender, non distended, no guarding  Extremities: No edema, No lymphangitis, No  petechiae, No rashes, no synovitis   Data Reviewed: I have personally reviewed following labs and imaging studies Basic Metabolic Panel: Recent Labs  Lab 07/29/20 0534 08/01/20 1251 08/02/20 0427 08/03/20 0446 08/04/20 0534  NA 136 133* 134* 132* 130*  K 3.7 3.7 4.0 3.6 3.3*  CL 80* 78* 80* 79* 78*  CO2 43* 40* 38* 37* 36*  GLUCOSE 99 76 172* 359* 412*  BUN 61* 85* 85* 83* 79*  CREATININE 1.34* 1.47* 1.53* 1.49* 1.41*  CALCIUM 9.4 8.7* 8.5* 8.4* 8.7*  MG 2.5*  --   --   --  2.6*   Liver Function Tests: Recent Labs  Lab 08/01/20 1251 08/03/20 0446  AST 22 20  ALT 27 25  ALKPHOS 91 79  BILITOT 1.6* 1.9*  PROT 7.1 6.6  ALBUMIN 3.6 3.1*   No results for input(s): LIPASE, AMYLASE in the last 168 hours. No results for input(s): AMMONIA in the last 168 hours. Coagulation Profile: Recent Labs  Lab 07/29/20 0534 08/01/20 1251 08/02/20 0427 08/03/20 0446 08/04/20 0534  INR 1.8* 2.7* 2.7* 3.7* 4.7*   CBC: Recent Labs  Lab 07/29/20 0534 08/01/20 1251 08/02/20 0427 08/03/20 0446 08/04/20 0534  WBC 13.2* 20.0* 15.8* 16.3* 19.2*  HGB 15.2* 15.9* 15.7* 14.1 13.8  HCT 47.2* 48.9* 48.4* 44.2 42.5  MCV 96.9 96.6 97.4 98.0 97.3  PLT 274 245 232 241 269   Cardiac Enzymes: No results for input(s): CKTOTAL, CKMB, CKMBINDEX, TROPONINI in the last 168 hours. BNP: Invalid input(s): POCBNP CBG: Recent Labs  Lab 08/03/20 2023 08/04/20 0728 08/04/20 1111 08/04/20 1627 08/04/20 1708  GLUCAP 165* 377* 358* 67* 66*  HbA1C: No results for input(s): HGBA1C in the last 72 hours. Urine analysis:    Component Value Date/Time   COLORURINE YELLOW 08/01/2020 1539   APPEARANCEUR CLEAR 08/01/2020 1539   LABSPEC 1.012 08/01/2020 1539   PHURINE 6.0 08/01/2020 1539   GLUCOSEU NEGATIVE 08/01/2020 1539   HGBUR MODERATE (A) 08/01/2020 1539   BILIRUBINUR NEGATIVE 08/01/2020 1539   KETONESUR NEGATIVE 08/01/2020 1539   PROTEINUR 30 (A) 08/01/2020 1539   NITRITE NEGATIVE  08/01/2020 1539   LEUKOCYTESUR NEGATIVE 08/01/2020 1539   Sepsis Labs: @LABRCNTIP (procalcitonin:4,lacticidven:4) ) Recent Results (from the past 240 hour(s))  Culture, Urine     Status: Abnormal   Collection Time: 08/01/20  3:39 PM   Specimen: Urine, Clean Catch  Result Value Ref Range Status   Specimen Description   Final    URINE, CLEAN CATCH Performed at [facility], [address], [place], [place] [address]    Special Requests   Final    NONE Performed at [facility], [address] [place], [place] [address]    Culture >=100,000 COLONIES/mL ENTEROCOCCUS FAECALIS (A)  Final   Report Status 08/04/2020 FINAL  Final   Organism ID, Bacteria ENTEROCOCCUS FAECALIS (A)  Final      Susceptibility   Enterococcus faecalis - MIC*    AMPICILLIN <=2 SENSITIVE Sensitive     NITROFURANTOIN <=16 SENSITIVE Sensitive     VANCOMYCIN 1 SENSITIVE Sensitive     * >=100,000 COLONIES/mL ENTEROCOCCUS FAECALIS  MRSA PCR Screening     Status: None   Collection Time: 08/01/20  6:05 PM   Specimen: Nasopharyngeal  Result Value Ref Range Status   MRSA by PCR NEGATIVE NEGATIVE Final    Comment:        The GeneXpert MRSA Assay (FDA approved for NASAL specimens only), is one component of a comprehensive MRSA colonization surveillance program. It is not intended to diagnose MRSA infection nor to guide or monitor treatment for MRSA infections. Performed at [facility], [address], [place], [place] [address]   Resp Panel by RT-PCR (Flu A&B, Covid) Nasopharyngeal Swab     Status: None   Collection Time: 08/01/20  6:05 PM   Specimen: Nasopharyngeal Swab; Nasopharyngeal(NP) swabs in vial transport medium  Result Value Ref Range Status   SARS Coronavirus 2 by RT PCR NEGATIVE NEGATIVE Final    Comment: (NOTE) SARS-CoV-2 target nucleic acids are NOT DETECTED.  The SARS-CoV-2 RNA is generally detectable in upper respiratory specimens during the acute phase of infection. The lowest concentration  of SARS-CoV-2 viral copies this assay can detect is 138 copies/mL. A negative result does not preclude SARS-Cov-2 infection and should not be used as the sole basis for treatment or other patient management decisions. A negative result may occur with  improper specimen collection/handling, submission of specimen other than nasopharyngeal swab, presence of viral mutation(s) within the areas targeted by this assay, and inadequate number of viral copies(<138 copies/mL). A negative result must be combined with clinical observations, patient history, and epidemiological information. The expected result is Negative.  Fact Sheet for Patients:  EntrepreneurPulse.com.au  Fact Sheet for Healthcare Providers:  IncredibleEmployment.be  This test is no t yet approved or cleared by the [place] FDA and  has been authorized for detection and/or diagnosis of SARS-CoV-2 by FDA under an Emergency Use Authorization (EUA). This EUA will remain  in effect (meaning this test can be used) for the duration of the COVID-19 declaration under Section 564(b)(1) of the Act, 21 U.S.C.section 360bbb-3(b)(1), unless the  authorization is terminated  or revoked sooner.       Influenza A by PCR NEGATIVE NEGATIVE Final   Influenza B by PCR NEGATIVE NEGATIVE Final    Comment: (NOTE) The Xpert Xpress SARS-CoV-2/FLU/RSV plus assay is intended as an aid in the diagnosis of influenza from Nasopharyngeal swab specimens and should not be used as a sole basis for treatment. Nasal washings and aspirates are unacceptable for Xpert Xpress SARS-CoV-2/FLU/RSV testing.  Fact Sheet for Patients: EntrepreneurPulse.com.au  Fact Sheet for Healthcare Providers: IncredibleEmployment.be  This test is not yet approved or cleared by the [place] FDA and has been authorized for detection and/or diagnosis of SARS-CoV-2 by FDA under an Emergency Use  Authorization (EUA). This EUA will remain in effect (meaning this test can be used) for the duration of the COVID-19 declaration under Section 564(b)(1) of the Act, 21 U.S.C. section 360bbb-3(b)(1), unless the authorization is terminated or revoked.  Performed at [facility], [address] [place], [place] [address]      Scheduled Meds: . amiodarone  200 mg Oral Daily  . arformoterol  15 mcg Nebulization BID  . budesonide (PULMICORT) nebulizer solution  0.5 mg Nebulization BID  . Chlorhexidine Gluconate Cloth  6 each Topical Daily  . hydrocortisone  25 mg Rectal BID  . insulin aspart  0-15 Units Subcutaneous TID WC  . insulin aspart  0-5 Units Subcutaneous QHS  . insulin glargine  10 Units Subcutaneous Daily  . ipratropium-albuterol  3 mL Nebulization Q6H  . methylPREDNISolone (SOLU-MEDROL) injection  80 mg Intravenous Q12H  . pantoprazole  40 mg Oral Daily  . polyethylene glycol  17 g Oral Daily  . sertraline  50 mg Oral QHS  . tamsulosin  0.4 mg Oral Daily  . Warfarin - Pharmacist Dosing Inpatient   Does not apply q1600   Continuous Infusions:  Procedures/Studies: CT CHEST WO CONTRAST  Result Date: 08/01/2020 CLINICAL DATA:  Cough.  Shortness of breath.  Pneumonia. EXAM: CT CHEST WITHOUT CONTRAST TECHNIQUE: Multidetector CT imaging of the chest was performed following the standard protocol without IV contrast. COMPARISON:  Chest radiograph 08/01/2020 and chest CT from 04/18/2020 FINDINGS: Cardiovascular: Coronary, aortic arch, and branch vessel atherosclerotic vascular disease. Mild cardiomegaly. Prior CABG. Mediastinum/Nodes: Scattered mediastinal lymph nodes are present. 1.0 cm right lower paratracheal node on image 41 of series 2, previously 1.4 cm. Lungs/Pleura: Tracheostomy tube in place. Emphysema. Hazy ground-glass opacities throughout both lungs, but substantially improved from 04/18/2020, possibly from improving pulmonary edema. Bandlike densities tracking in both lower  lobes and to a lesser extent in both upper lobes with associated volume loss, probably from atelectasis. Upper Abdomen: Cholelithiasis.  Bilateral adrenal adenomas. Musculoskeletal: Stable T9 compression fracture. IMPRESSION: 1. Hazy ground-glass opacities throughout both lungs, but substantially improved from 04/18/2020, possibly from improving pulmonary edema. 2. Bandlike densities tracking in both lower lobes and to a lesser extent in both upper lobes with associated volume loss, probably from atelectasis. 3. Other imaging findings of potential clinical significance: Coronary, aortic arch, and branch vessel atherosclerotic vascular disease. Mild cardiomegaly. Prior CABG. Cholelithiasis. Bilateral adrenal adenomas. Stable T9 compression fracture. 4. Emphysema and aortic atherosclerosis. Aortic Atherosclerosis (ICD10-I70.0) and Emphysema (ICD10-J43.9). Electronically Signed   By: Van Clines M.D.   On: 08/01/2020 17:10   DG CHEST PORT 1 VIEW  Result Date: 08/02/2020 CLINICAL DATA:  PICC line placement EXAM: PORTABLE CHEST 1 VIEW COMPARISON:  November 26 21 FINDINGS: The heart size remains enlarged. There are improving airspace opacities bilaterally. Band like  opacities are again noted bilaterally favored to represent areas of atelectasis. There is no pneumothorax. Postsurgical changes are noted over the right lower lung zone. There is a probable right-sided pleural effusion. The endotracheal tube terminates above the carina by approximately 4 cm. The left-sided PICC line projects over the right atrium and should be retracted by approximately 4 cm. IMPRESSION: 1. The left-sided PICC line tip projects over the right atrium and should be retracted by approximately 4 cm. 2. Remaining lines and tubes as detailed above. 3. Improving aeration throughout both lungs. These results will be called to the ordering clinician or representative by the Radiologist Assistant, and communication documented in the PACS or  [employer]. Electronically Signed   By: Constance Holster M.D.   On: 08/02/2020 19:20   DG Chest Port 1 View  Result Date: 07/28/2020 CLINICAL DATA:  Shortness of breath. EXAM: PORTABLE CHEST 1 VIEW COMPARISON:  July 21, 2020. FINDINGS: Stable cardiomegaly. Tracheostomy tube is in good position. Status post coronary bypass graft. No pneumothorax is noted. Mild bibasilar subsegmental atelectasis is noted with small pleural effusions. Bony thorax is unremarkable. IMPRESSION: Mild bibasilar subsegmental atelectasis with small pleural effusions. Electronically Signed   By: Marijo Conception M.D.   On: 07/28/2020 09:31   DG Chest Port 1 View  Result Date: 07/21/2020 CLINICAL DATA:  Shortness of breath EXAM: PORTABLE CHEST 1 VIEW COMPARISON:  07/03/2020 FINDINGS: Tracheostomy tube remains in place. Post CABG changes. Stable cardiomegaly. There is pulmonary vascular congestion with diffuse bilateral interstitial prominence. Streaky perihilar and bibasilar opacities. No large pleural fluid collection. No pneumothorax. IMPRESSION: Findings suggestive of CHF with pulmonary edema. Streaky perihilar and bibasilar opacities may represent atelectasis, alveolar edema, versus infection. Electronically Signed   By: Davina Poke D.O.   On: 07/21/2020 11:46   EEG adult  Result Date: 08/01/2020 Lora Havens, MD     08/01/2020  4:38 PM Patient Name: AZHIA SIEFKEN MRN: [id number] Epilepsy Attending: Lora Havens Referring Physician/Provider: Dr Shanon Brow Gay Rape Date: 08/01/2020 Duration: 23.43 mins Patient history: 54yo with an episode of near syncope. EEG to evaluate for seizure. Level of alertness: Awake AEDs during EEG study: None Technical aspects: This EEG study was done with scalp electrodes positioned according to the 10-20 International system of electrode placement. Electrical activity was acquired at a sampling rate of 500Hz  and reviewed with a high frequency filter of 70Hz  and a low frequency  filter of 1Hz . EEG data were recorded continuously and digitally stored. Description: The posterior dominant rhythm consists of 8 Hz activity of moderate voltage (25-35 uV) seen predominantly in posterior head regions, symmetric and reactive to eye opening and eye closing. EEG showed intermittent generalized 3 to 6 Hz theta-delta slowing. Hyperventilation and photic stimulation were not performed.   ABNORMALITY -Intermittent slow, generalized IMPRESSION: This study is suggestive of mild diffuse encephalopathy, nonspecific etiology. No seizures or epileptiform discharges were seen throughout the recording. Priyanka O Yadav   [place] EKG SITE RITE  Result Date: 08/02/2020 If Site Rite image not attached, placement could not be confirmed due to current cardiac rhythm.   Orson Eva, DO  [facility]  If 7PM-7AM, please contact night-coverage [email or web address] Password [email or web address] 08/04/2020, 5:59 PM   LOS: 3 days

## 2020-08-04 NOTE — Progress Notes (Signed)
CRITICAL VALUE ALERT  Critical Value:  INR 4.7  Date & Time Notied:  08/04/2020 0830  Provider Notified: Dr. Carles Collet, MD  Orders Received/Actions taken: N/A

## 2020-08-04 NOTE — Consult Note (Signed)
Referring Provider: Dr. Carles Collet  Primary Care Physician:  Monico Blitz, MD Primary Gastroenterologist:  Dr. Gala Romney   Date of Admission: 08/01/20 Date of Consultation: 08/04/20  Reason for Consultation:  Rectal bleeding  HPI:  Theresa [name] is a 54 y.o. year old female with multiple medical issues (CHF, DVTs on Coumadin, COPD, diabetes, lupus anticoagulant syndrome, CAD, history of STEMI and CABG, chronic respiratory failure, chronic trach since 2002), admitted with acute on chronic respiratory failure with hypoxia in setting of COPD exacerbation. Due to rectal pain in setting of impaction and rectal bleeding, GI has been consulted.  Notes BM every other day at home. No rectal bleeding at home. No prior colonoscopy. Rectal discomfort noted prior to BM this morning, now with some improvement. On tramadol prn at home. No bowel regimen as outpatient. No abdominal pain. No known family history of colorectal cancer or polyps. Small amount of rectal bleeding this morning. INR 4.7 on Coumadin.     Past Medical History:  Diagnosis Date  . Acute kidney failure with lesion of tubular necrosis (HCC)   . Acute systolic heart failure ([place])   . CAD (coronary artery disease)    a. s/p prior PCI. b. CABG 2007 at [facility] in [place] 2007. c. inferior STEMI 10/2015 s/p DES to dSVG-PDA.  . Cardiac arrest ([place])   . Cervical cancer ([place])   . Chronic diastolic CHF (congestive heart failure) ([place])   . Chronic respiratory failure ([place])    s/p tracheostomy 2002  . Chronic RUQ pain   . COPD (chronic obstructive pulmonary disease) ([place])   . Diabetes mellitus ([place])   . DVT (deep venous thrombosis) ([place])   . Endometriosis   . History of gallstones 01/2016   seen on Ultrasound  . History of HIDA scan 11/2016   normal  . HTN (hypertension)   . Hyperlipidemia   . Lupus anticoagulant disorder (HCC)    on coumadin  . Morbid obesity ([place])   . Psoriasis   . ST elevation (STEMI) myocardial infarction  involving right coronary artery ([place]) 10/29/15   stent to VG to PDA  . Toe fracture, right 03/29/2018  . Tracheostomy in place [facility]), chronic since 2002 11/03/2015    Past Surgical History:  Procedure Laterality Date  . CARDIAC CATHETERIZATION N/A 10/29/2015   Procedure: Left Heart Cath and Cors/Grafts Angiography;  Surgeon: Burnell Blanks, MD;  Location: [place] [facility];  Service: Cardiovascular;  Laterality: N/A;  . CARDIAC CATHETERIZATION  10/29/2015   Procedure: Coronary Stent Intervention;  Surgeon: Burnell Blanks, MD;  Location: [place] [facility];  Service: Cardiovascular;;  . CAROTID STENT    . CESAREAN SECTION WITH BILATERAL TUBAL LIGATION    . CORONARY ARTERY BYPASS GRAFT  2007   2V  . IR GASTROSTOMY TUBE MOD SED  11/13/2018  . RIGHT/LEFT HEART CATH AND CORONARY/GRAFT ANGIOGRAPHY N/A 03/24/2018   Procedure: RIGHT/LEFT HEART CATH AND CORONARY/GRAFT ANGIOGRAPHY;  Surgeon: Belva Crome, MD;  Location: [place] [facility];  Service: Cardiovascular;  Laterality: N/A;  . TRACHEOSTOMY      Prior to Admission medications   Medication Sig Start Date End Date Taking? Authorizing Provider  albuterol (PROVENTIL) (2.5 MG/3ML) 0.083% nebulizer solution Take 3 mLs (2.5 mg total) by nebulization every 4 (four) hours as needed for wheezing. 07/07/20  Yes Johnson, Clanford L, MD  albuterol (VENTOLIN HFA) 108 (90 Base) MCG/ACT inhaler Inhale 2 puffs into the lungs every 4 (four) hours as needed for wheezing or  shortness of breath.   Yes [provider]  amiodarone (PACERONE) 200 MG tablet Take 1 tablet (200 mg total) by mouth daily. 07/30/20 08/29/20 Yes Shah, Pratik D, DO  atorvastatin (LIPITOR) 20 MG tablet Take 20 mg by mouth daily. 07/27/20  Yes [provider]  bumetanide (BUMEX) 2 MG tablet Take 1 tablet (2 mg total) by mouth daily. Takes 6 mg am and 4 mg pm 07/29/20  Yes Shah, Pratik D, DO  gabapentin (NEURONTIN) 300 MG capsule Take 3 capsules (900 mg total) by  mouth at bedtime. 07/07/20  Yes Johnson, Clanford L, MD  insulin degludec (TRESIBA FLEXTOUCH) 100 UNIT/ML FlexTouch Pen Inject 30 Units into the skin at bedtime. 07/07/20  Yes Johnson, Clanford L, MD  mometasone (ELOCON) 0.1 % ointment Apply 1 application topically daily as needed. 06/24/20  Yes [provider]  nitroGLYCERIN (NITROSTAT) 0.4 MG SL tablet Place 0.4 mg under the tongue every 5 (five) minutes as needed for chest pain.   Yes [provider]  NOVOLOG FLEXPEN 100 UNIT/ML FlexPen Inject 1-10 Units into the skin 3 (three) times daily before meals. 04/06/20  Yes [provider]  OZEMPIC, 0.25 OR 0.5 MG/DOSE, 2 MG/1.5ML SOPN Inject 0.5 mg into the skin every Wednesday.  04/08/19  Yes [provider]  pantoprazole (PROTONIX) 20 MG tablet Take 20 mg by mouth daily. 02/16/20  Yes [provider]  polyethylene glycol (MIRALAX / GLYCOLAX) 17 g packet Take 17 g by mouth daily as needed for mild constipation. 06/20/20  Yes Kathie Dike, MD  potassium chloride SA (KLOR-CON) 20 MEQ tablet Take 2 tablets (40 mEq total) by mouth daily. 07/07/20 08/06/20 Yes Johnson, Clanford L, MD  Pseudoeph-Doxylamine-DM-APAP (NYQUIL PO) Take 20 mLs by mouth daily.   Yes [provider]  sertraline (ZOLOFT) 50 MG tablet Take 50 mg by mouth at bedtime.    Yes [provider]  tamsulosin (FLOMAX) 0.4 MG CAPS capsule Take 0.4 mg by mouth daily.   Yes [provider]  traMADol (ULTRAM) 50 MG tablet Take 50 mg by mouth 3 (three) times daily as needed for moderate pain.  03/29/20  Yes [provider]  warfarin (COUMADIN) 2.5 MG tablet Take 1-2 tablets (2.5-5 mg total) by mouth See admin instructions. Mon,Wed,Fri take 5mg   2.5mg  all other days 07/07/20  Yes Johnson, Clanford L, MD  LORazepam (ATIVAN) 0.5 MG tablet Take 0.5 mg by mouth every 6 (six) hours as needed for anxiety.  Patient not taking: Reported on 08/02/2020    [provider]     Current Facility-Administered Medications  Medication Dose Route Frequency Provider Last Rate Last Admin  . 0.9 %  sodium chloride infusion (Manually program via Guardrails IV Fluids)   Intravenous Once Tat, David, MD      . acetaminophen (TYLENOL) tablet 650 mg  650 mg Oral Q6H PRN Tat, David, MD       Or  . acetaminophen (TYLENOL) suppository 650 mg  650 mg Rectal Q6H PRN Orson Eva, MD   650 mg at 08/03/20 0904  . amiodarone (PACERONE) tablet 200 mg  200 mg Oral Daily Tat, David, MD   200 mg at 08/04/20 1129  . arformoterol (BROVANA) nebulizer solution 15 mcg  15 mcg Nebulization BID Orson Eva, MD   15 mcg at 08/04/20 0835  . budesonide (PULMICORT) nebulizer solution 0.5 mg  0.5 mg Nebulization BID Tat, David, MD   0.5 mg at 08/04/20 0825  . Chlorhexidine Gluconate Cloth 2 % PADS  6 each  6 each Topical Daily Tat, David, MD   6 each at 08/04/20 1130  . hydrocortisone (ANUSOL-HC) suppository 25 mg  25 mg Rectal BID Tat, Shanon Brow, MD   25 mg at 08/04/20 1131  . insulin aspart (novoLOG) injection 0-15 Units  0-15 Units Subcutaneous TID WC Orson Eva, MD   15 Units at 08/04/20 1131  . insulin aspart (novoLOG) injection 0-5 Units  0-5 Units Subcutaneous QHS Tat, David, MD      . insulin glargine (LANTUS) injection 10 Units  10 Units Subcutaneous Daily Tat, David, MD   10 Units at 08/04/20 1131  . ipratropium-albuterol (DUONEB) 0.5-2.5 (3) MG/3ML nebulizer solution 3 mL  3 mL Nebulization Q6H Orson Eva, MD   3 mL at 08/04/20 0820  . methylPREDNISolone sodium succinate (SOLU-MEDROL) 125 mg/2 mL injection 80 mg  80 mg Intravenous Therisa Doyne, MD   80 mg at 08/04/20 0615  . ondansetron (ZOFRAN) tablet 4 mg  4 mg Oral Q6H PRN Tat, David, MD   4 mg at 08/02/20 2239   Or  . ondansetron (ZOFRAN) injection 4 mg  4 mg Intravenous Q6H PRN Tat, David, MD      . pantoprazole (PROTONIX) EC tablet 40 mg  40 mg Oral Daily Tat, David, MD   40 mg at 08/04/20 1129  . polyethylene glycol (MIRALAX / GLYCOLAX)  packet 17 g  17 g Oral Daily PRN Tat, Shanon Brow, MD   17 g at 08/01/20 1432  . polyethylene glycol (MIRALAX / GLYCOLAX) packet 17 g  17 g Oral Daily Annitta Needs, NP      . sertraline (ZOLOFT) tablet 50 mg  50 mg Oral Benay Pike, MD   50 mg at 08/03/20 2206  . tamsulosin (FLOMAX) capsule 0.4 mg  0.4 mg Oral Daily Tat, David, MD   0.4 mg at 08/04/20 1129  . Warfarin - Pharmacist Dosing Inpatient   Does not apply [id number] Orson Eva, MD   Given at 08/02/20 1858  . witch hazel-glycerin (TUCKS) pad   Topical PRN Tat, Shanon Brow, MD        Allergies as of 08/01/2020 - Review Complete 07/21/2020  Allergen Reaction Noted  . Penicillins Rash   . Ciprofloxacin  05/07/2016  . Ibuprofen Rash 06/21/2016    Family History  Problem Relation Age of Onset  . Hypertension Mother   . Diabetes Mother   . Hypertension Father   . Diabetes Father   . Colon cancer Neg Hx   . Colon polyps Neg Hx     Social History   Socioeconomic History  . Marital status: Married    Spouse name: Not on file  . Number of children: Not on file  . Years of education: Not on file  . Highest education level: Not on file  Occupational History  . Not on file  Tobacco Use  . Smoking status: Current Every Day Smoker    Packs/day: 0.75    Types: Cigarettes    Start date: 10/23/1978  . Smokeless tobacco: Never Used  . Tobacco comment: 1/2 pack - trying to cut back   Vaping Use  . Vaping Use: Never used  Substance and Sexual Activity  . Alcohol use: No    Alcohol/week: 0.0 standard drinks  . Drug use: No  . Sexual activity: Not on file  Other Topics Concern  . Not on file  Social History Narrative  . Not on file   Social Determinants of Health   Financial  Resource Strain:   . Difficulty of Paying Living Expenses: Not on file  Food Insecurity:   . Worried About Charity fundraiser in the Last Year: Not on file  . Ran Out of Food in the Last Year: Not on file  Transportation Needs:   . Lack of Transportation  (Medical): Not on file  . Lack of Transportation (Non-Medical): Not on file  Physical Activity:   . Days of Exercise per Week: Not on file  . Minutes of Exercise per Session: Not on file  Stress:   . Feeling of Stress : Not on file  Social Connections:   . Frequency of Communication with Friends and Family: Not on file  . Frequency of Social Gatherings with Friends and Family: Not on file  . Attends Religious Services: Not on file  . Active Member of Clubs or Organizations: Not on file  . Attends Archivist Meetings: Not on file  . Marital Status: Not on file  Intimate Partner Violence:   . Fear of Current or Ex-Partner: Not on file  . Emotionally Abused: Not on file  . Physically Abused: Not on file  . Sexually Abused: Not on file    Review of Systems: Gen: Denies fever, chills, loss of appetite, change in weight or weight loss CV: Denies chest pain, heart palpitations, syncope, edema  Resp: Denies shortness of breath with rest, cough, wheezing GI:see HPI GU : Denies urinary burning, urinary frequency, urinary incontinence.  MS: Denies joint pain,swelling, cramping Derm: Denies rash, itching, dry skin Psych: Denies depression, anxiety,confusion, or memory loss Heme: see HPI  Physical Exam: Vital signs in last 24 hours: Temp:  [97.7 F (36.5 C)-98.3 F (36.8 C)] 97.7 F (36.5 C) (11/29 1312) Pulse Rate:  [40-170] 75 (11/29 1312) Resp:  [10-30] 16 (11/29 1312) BP: (99-161)/(47-103) 118/64 (11/29 1312) SpO2:  [79 %-99 %] 89 % (11/29 1312) FiO2 (%):  [28 %-40 %] 35 % (11/29 1111) Weight:  [93.4 kg] 93.4 kg (11/29 0600) Last BM Date: 08/04/20 General:   Alert,  Well-developed, well-nourished, pleasant and cooperative in NAD Head:  Normocephalic and atraumatic. Eyes:  Sclera clear, no icterus.   Conjunctiva pink. Ears:  Normal auditory acuity. Nose:  No deformity, discharge,  or lesions. Mouth:  No deformity or lesions, dentition normal. Lungs:  Clear  throughout to auscultation.   Trach in place Heart:  S1 S2 present without obvious murmur Abdomen:  Soft, nontender and nondistended. No masses, hepatosplenomegaly or hernias noted. Normal bowel sounds, without guarding, and without rebound.   Rectal:  Without obvious fissure, no prolapsing hemorrhoids, no external hemorrhoids. Patient declined internal rectal exam Msk:  Symmetrical without gross deformities. Normal posture. Extremities:  Without  edema. Neurologic:  Alert and  oriented x4 Psych:  Flat affect  Intake/Output from previous day: 11/28 0701 - 11/29 0700 In: 1320 [P.O.:1320] Out: 1100 [Urine:1100] Intake/Output this shift: No intake/output data recorded.  Lab Results: Recent Labs    08/02/20 0427 08/03/20 0446 08/04/20 0534  WBC 15.8* 16.3* 19.2*  HGB 15.7* 14.1 13.8  HCT 48.4* 44.2 42.5  PLT 232 241 269   BMET Recent Labs    08/02/20 0427 08/03/20 0446 08/04/20 0534  NA 134* 132* 130*  K 4.0 3.6 3.3*  CL 80* 79* 78*  CO2 38* 37* 36*  GLUCOSE 172* 359* 412*  BUN 85* 83* 79*  CREATININE 1.53* 1.49* 1.41*  CALCIUM 8.5* 8.4* 8.7*   LFT Recent Labs    08/03/20 0446  PROT 6.6  ALBUMIN 3.1*  AST 20  ALT 25  ALKPHOS 79  BILITOT 1.9*   PT/INR Recent Labs    08/03/20 0446 08/04/20 0534  LABPROT 35.7* 43.0*  INR 3.7* 4.7*    Studies/Results: DG CHEST PORT 1 VIEW  Result Date: 08/02/2020 CLINICAL DATA:  PICC line placement EXAM: PORTABLE CHEST 1 VIEW COMPARISON:  November 26 21 FINDINGS: The heart size remains enlarged. There are improving airspace opacities bilaterally. Band like opacities are again noted bilaterally favored to represent areas of atelectasis. There is no pneumothorax. Postsurgical changes are noted over the right lower lung zone. There is a probable right-sided pleural effusion. The endotracheal tube terminates above the carina by approximately 4 cm. The left-sided PICC line projects over the right atrium and should be retracted by  approximately 4 cm. IMPRESSION: 1. The left-sided PICC line tip projects over the right atrium and should be retracted by approximately 4 cm. 2. Remaining lines and tubes as detailed above. 3. Improving aeration throughout both lungs. These results will be called to the ordering clinician or representative by the Radiologist Assistant, and communication documented in the PACS or [employer]. Electronically Signed   By: Constance Holster M.D.   On: 08/02/2020 19:20    Impression: 54 year old female with multiple medical issues (CHF, DVTs on Coumadin, COPD, diabetes, lupus anticoagulant syndrome, CAD, history of STEMI and CABG, chronic respiratory failure, chronic trachsince 2002), admitted with acute on chronic respiratory failure with hypoxia in setting of COPD exacerbation, found to have rectal bleeding in setting of chronic constipation, fecal impaction, supratherapeutic INR.   Patient declined internal rectal exam today. Some improvement noted after passing of stool this morning. No drop in Hgb. Will treat supportively now. Unable to rule out occult fissure. No prior colonoscopy, so this will need to be discussed as outpatient unless acute blood loss anemia, worsening bleeding.   Plan: Continue Anusol BID Miralax started today, receiving first dose this afternoon. May need to increase and low threshold for starting Linzess while inpatient Follow H/H Supportive measures Colonoscopy as outpatient   Annitta Needs, PhD, ANP-BC [facility]     LOS: 3 days    08/04/2020, 1:25 PM

## 2020-08-04 NOTE — Progress Notes (Signed)
Patient still continues to complain of rectal pain. I transferred patient from bed to chair and upon standing blood began to drip from the patients anus. Vitals were HR 115-120, BP 117/63, O2 97%, and RR 23. Dr. Carles Collet was notified and put in orders for a GI consult. I then placed patient on the bedside commode and she was able to have a bowel movement with some relief in pain. The patient requested to rest so was transferred back to bed. We will attempt to use the bedside commode later today.

## 2020-08-04 NOTE — Progress Notes (Signed)
Trach changed. Patient could not remember the last time it had been done. She had a 4 Shiley cfls in place and I replaced it with the same. BBS noted. Suction performed after change. Small thick yellow secretions obtained. SPO2 100% after change and color change noted on the ETCO2 detector. Patient is stable and resting.

## 2020-08-04 NOTE — Progress Notes (Signed)
Per Dr. Carles Collet, Hold insulin coverage for meals until further orders if patient does not consume more than 50% of meals.

## 2020-08-04 NOTE — Progress Notes (Signed)
Inpatient Diabetes Program Recommendations  AACE/ADA: New Consensus Statement on Inpatient Glycemic Control   Target Ranges:  Prepandial:   less than 140 mg/dL      Peak postprandial:   less than 180 mg/dL (1-2 hours)      Critically ill patients:  140 - 180 mg/dL  Results for ELAJAH, KUNSMAN (MRN [id number]) as of 08/04/2020 07:25  Ref. Range 08/04/2020 05:34  Glucose Latest Ref Range: 70 - 99 mg/dL 412 (H)   Results for NICOYA, FRIEL (MRN [id number]) as of 08/04/2020 07:25  Ref. Range 08/03/2020 07:31 08/03/2020 11:35 08/03/2020 15:13 08/03/2020 16:11 08/03/2020 17:39 08/03/2020 20:23  Glucose-Capillary Latest Ref Range: 70 - 99 mg/dL 357 (H)  Novolog 15 units@8 :25 346 (H)  Novolog 11 units@11 :47     Lantus 10 units 50 (L) 99 165 (H)    Review of Glycemic Control  Diabetes history: DM2 Outpatient Diabetes medications: Tresiba 30unitsQHS, Novolog 0-10unitsTID, Ozempic 0.5 mg Qweek (Wednesday) Current orders for Inpatient glycemic control: Lantus 10 units daily, Novolog 0-15 units TID with meals, Novolog 0-5 units QHS; Solumedrol 80 mg Q12H  Inpatient Diabetes Program Recommendations:    Insulin: If steroids are continued as ordered, please consider increasing Lantus to 30 units daily. If patient starts eating at least 50% of meals, would recommend ordering Novolog 5 units TID with meals for meal coverage (if patient eats at least 50% of meals).  NOTE: Patient takes [place] insulin outpatient which has a duration of up to 42 hours. Lab glucose 412 mg/dl today.   Thanks, Barnie Alderman, RN, MSN, CDE Diabetes Coordinator Inpatient Diabetes Program [phone] (Team Pager from 8am to 5pm)

## 2020-08-04 NOTE — NC FL2 (Signed)
[place] LEVEL OF CARE SCREENING TOOL     IDENTIFICATION  Patient Name: Theresa [name] Birthdate: Dec 08, 1965 Sex: female Admission Date (Current Location): 08/01/2020  [facility] and [place] Number:  [employer] and Address:  [facility] [address], [place]      Provider Number: [id number]  Attending Physician Name and Address:  Orson Eva, MD  Relative Name and Phone Number:  Stehanie Ekstrom (husband) Ph: [phone]    Current Level of Care: Hospital Recommended Level of Care: [place] Prior Approval Number:    Date Approved/Denied:   PASRR Number: [id number]  Discharge Plan: SNF    Current Diagnoses: Patient Active Problem List   Diagnosis Date Noted  . Acute renal failure superimposed on stage 3b chronic kidney disease ([place]) 08/01/2020  . Acute renal failure superimposed on stage 3a chronic kidney disease ([place]) 08/01/2020  . Acute on chronic respiratory failure ([place]) 07/21/2020  . Hypoxemia   . Atrial flutter with rapid ventricular response ([place])   . Sepsis due to Escherichia coli (E. coli) ([place]) 06/16/2020  . Bacteremia due to Gram-negative bacteria 06/16/2020  . Severe sepsis ([place]) 06/15/2020  . Biliary colic 20/25/4270  . Abdominal pain 06/15/2020  . Transaminitis 06/15/2020  . Sepsis due to undetermined organism ([place]) 06/15/2020  . Systolic and diastolic CHF, chronic ([place]) 06/15/2020  . CHF (congestive heart failure) ([place]) 04/18/2020  . Acute respiratory failure ([place]) 04/17/2020  . Chronic venous insufficiency 06/19/2019  . Cholelithiasis 05/07/2019  . Adult body mass index 50.0-59.9 ([place]) 03/22/2019  . Anxiety 03/22/2019  . Low back pain 03/22/2019  . Cigarette nicotine dependence without complication 62/37/6283  . History of vitamin D deficiency 03/22/2019  . Insomnia 03/22/2019  . Knee pain 03/22/2019  . Localized, primary osteoarthritis of lower leg, unspecified laterality  03/22/2019  . Neck pain 03/22/2019  . Psoriasis 03/22/2019  . Thrombophlebitis of deep veins of lower extremity ([place]) 03/22/2019  . DVT (deep venous thrombosis) ([place]) 03/22/2019  . Fracture of humeral shaft, left, closed 03/22/2019  . Chronic respiratory insufficiency 01/19/2019  . CAD (coronary artery disease) 01/15/2019  . Educated about COVID-19 virus infection 01/12/2019  . Acute on chronic combined systolic and diastolic HF (heart failure) ([place]) 01/12/2019  . Acute on chronic respiratory failure with hypoxia ([place]) 10/24/2018  . Pulmonary hypertension, unspecified ([place])   . Acute on chronic respiratory failure with hypoxia ([place]) 08/01/2017  . Uncontrolled type 2 diabetes mellitus with hyperglycemia, with long-term current use of insulin ([place]) 08/01/2017  . Dyspnea 07/31/2017  . Nausea & vomiting   . Acute on chronic congestive heart failure ([place])   . Acute respiratory failure with hypoxia ([place]) 01/22/2016  . Hyperglycemia due to diabetes mellitus ([place]) 01/22/2016  . CAD (coronary artery disease) of artery bypass graft: PTCA/DES to distal body VG to PDA 10/29/15 11/03/2015  . Tracheostomy in place [facility]), chronic since 2002 11/03/2015  . Hypokalemia 11/03/2015  . Lupus anticoagulant syndrome ([place])   . Chronic diastolic CHF (congestive heart failure) ([place])   . Respiratory failure ([place])   . Coronary artery disease involving coronary bypass graft of native heart without angina pectoris   . OSA (obstructive sleep apnea)   . COPD exacerbation ([place])   . Tobacco abuse   . DM (diabetes mellitus) ([place]) 06/11/2009  . Hyperlipidemia LDL goal <70 06/11/2009  . Acute thromboembolism of deep veins of lower extremity ([place]) 06/11/2009  . History of CABG 06/11/2009  . Essential hypertension 05/27/2009  .  CAD, NATIVE VESSEL 05/27/2009    Orientation RESPIRATION BLADDER Height & Weight     Self, Time, Situation, Place  Tracheostomy, O2 (4-6L/min) Incontinent, Indwelling catheter Weight: 205 lb  14.6 oz (93.4 kg) Height:  4\' 10"  (147.3 cm)  BEHAVIORAL SYMPTOMS/MOOD NEUROLOGICAL BOWEL NUTRITION STATUS      Continent Diet (Carb modified)  AMBULATORY STATUS COMMUNICATION OF NEEDS Skin   Limited Assist Verbally Other (Comment) (Bilateral leg rash)                       Personal Care Assistance Level of Assistance  Bathing, Feeding, Dressing Bathing Assistance: Limited assistance Feeding assistance: Independent Dressing Assistance: Limited assistance     Functional Limitations Info  Sight, Hearing, Speech Sight Info: Impaired Hearing Info: Adequate Speech Info: Adequate    SPECIAL CARE FACTORS FREQUENCY  PT (By licensed PT)     PT Frequency: 5x's/week              Contractures Contractures Info: Not present    Additional Factors Info  Code Status, Insulin Sliding Scale, Allergies, Psychotropic Code Status Info: Full Allergies Info: Penicillins; Ciprofloxacin; Ibuprofen Psychotropic Info: Zoloft (sertraline) Insulin Sliding Scale Info: See discharge summary       Current Medications (08/04/2020):  This is the current hospital active medication list Current Facility-Administered Medications  Medication Dose Route Frequency Provider Last Rate Last Admin  . acetaminophen (TYLENOL) tablet 650 mg  650 mg Oral Q6H PRN Tat, David, MD       Or  . acetaminophen (TYLENOL) suppository 650 mg  650 mg Rectal Q6H PRN Orson Eva, MD   650 mg at 08/03/20 0904  . amiodarone (PACERONE) tablet 200 mg  200 mg Oral Daily Tat, David, MD   200 mg at 08/04/20 1129  . arformoterol (BROVANA) nebulizer solution 15 mcg  15 mcg Nebulization BID Orson Eva, MD   15 mcg at 08/04/20 0835  . budesonide (PULMICORT) nebulizer solution 0.5 mg  0.5 mg Nebulization BID Tat, David, MD   0.5 mg at 08/04/20 0825  . Chlorhexidine Gluconate Cloth 2 % PADS 6 each  6 each Topical Daily Tat, David, MD   6 each at 08/04/20 1130  . hydrocortisone (ANUSOL-HC) suppository 25 mg  25 mg Rectal BID Tat,  Shanon Brow, MD   25 mg at 08/04/20 1131  . insulin aspart (novoLOG) injection 0-15 Units  0-15 Units Subcutaneous TID WC Orson Eva, MD   15 Units at 08/04/20 1131  . insulin aspart (novoLOG) injection 0-5 Units  0-5 Units Subcutaneous QHS Tat, David, MD      . insulin glargine (LANTUS) injection 10 Units  10 Units Subcutaneous Daily Tat, David, MD   10 Units at 08/04/20 1131  . ipratropium-albuterol (DUONEB) 0.5-2.5 (3) MG/3ML nebulizer solution 3 mL  3 mL Nebulization Q6H Tat, Shanon Brow, MD   3 mL at 08/04/20 1457  . methylPREDNISolone sodium succinate (SOLU-MEDROL) 125 mg/2 mL injection 80 mg  80 mg Intravenous Therisa Doyne, MD   80 mg at 08/04/20 0615  . ondansetron (ZOFRAN) tablet 4 mg  4 mg Oral Q6H PRN Tat, David, MD   4 mg at 08/02/20 2239   Or  . ondansetron (ZOFRAN) injection 4 mg  4 mg Intravenous Q6H PRN Tat, David, MD      . pantoprazole (PROTONIX) EC tablet 40 mg  40 mg Oral Daily Tat, David, MD   40 mg at 08/04/20 1129  . polyethylene glycol (MIRALAX / GLYCOLAX) packet 17  g  17 g Oral Daily PRN Orson Eva, MD   17 g at 08/01/20 1432  . polyethylene glycol (MIRALAX / GLYCOLAX) packet 17 g  17 g Oral Daily Annitta Needs, NP      . sertraline (ZOLOFT) tablet 50 mg  50 mg Oral Benay Pike, MD   50 mg at 08/03/20 2206  . tamsulosin (FLOMAX) capsule 0.4 mg  0.4 mg Oral Daily Tat, David, MD   0.4 mg at 08/04/20 1129  . Warfarin - Pharmacist Dosing Inpatient   Does not apply [id number] Orson Eva, MD   Given at 08/02/20 1858  . witch hazel-glycerin (TUCKS) pad   Topical PRN Tat, Shanon Brow, MD         Discharge Medications: Please see discharge summary for a list of discharge medications.  Relevant Imaging Results:  Relevant Lab Results:   Additional Information SSN: [id number]  Ihor Gully, LCSW

## 2020-08-05 DIAGNOSIS — R55 Syncope and collapse: Secondary | ICD-10-CM | POA: Diagnosis not present

## 2020-08-05 DIAGNOSIS — N1832 Chronic kidney disease, stage 3b: Secondary | ICD-10-CM

## 2020-08-05 DIAGNOSIS — R10819 Abdominal tenderness, unspecified site: Secondary | ICD-10-CM

## 2020-08-05 DIAGNOSIS — K5900 Constipation, unspecified: Secondary | ICD-10-CM | POA: Diagnosis not present

## 2020-08-05 DIAGNOSIS — E119 Type 2 diabetes mellitus without complications: Secondary | ICD-10-CM | POA: Diagnosis not present

## 2020-08-05 DIAGNOSIS — K625 Hemorrhage of anus and rectum: Secondary | ICD-10-CM

## 2020-08-05 DIAGNOSIS — D6862 Lupus anticoagulant syndrome: Secondary | ICD-10-CM

## 2020-08-05 LAB — GLUCOSE, CAPILLARY
Glucose-Capillary: 104 mg/dL — ABNORMAL HIGH (ref 70–99)
Glucose-Capillary: 307 mg/dL — ABNORMAL HIGH (ref 70–99)
Glucose-Capillary: 449 mg/dL — ABNORMAL HIGH (ref 70–99)
Glucose-Capillary: 464 mg/dL — ABNORMAL HIGH (ref 70–99)
Glucose-Capillary: 482 mg/dL — ABNORMAL HIGH (ref 70–99)
Glucose-Capillary: 52 mg/dL — ABNORMAL LOW (ref 70–99)
Glucose-Capillary: 80 mg/dL (ref 70–99)

## 2020-08-05 LAB — BPAM FFP
Blood Product Expiration Date: 202112042359
ISSUE DATE / TIME: 202111291307
Unit Type and Rh: 5100

## 2020-08-05 LAB — CBC
HCT: 39.9 % (ref 36.0–46.0)
Hemoglobin: 12.8 g/dL (ref 12.0–15.0)
MCH: 31.6 pg (ref 26.0–34.0)
MCHC: 32.1 g/dL (ref 30.0–36.0)
MCV: 98.5 fL (ref 80.0–100.0)
Platelets: 252 10*3/uL (ref 150–400)
RBC: 4.05 MIL/uL (ref 3.87–5.11)
RDW: 15.1 % (ref 11.5–15.5)
WBC: 16.9 10*3/uL — ABNORMAL HIGH (ref 4.0–10.5)
nRBC: 0 % (ref 0.0–0.2)

## 2020-08-05 LAB — BASIC METABOLIC PANEL
Anion gap: 13 (ref 5–15)
BUN: 76 mg/dL — ABNORMAL HIGH (ref 6–20)
CO2: 39 mmol/L — ABNORMAL HIGH (ref 22–32)
Calcium: 8.9 mg/dL (ref 8.9–10.3)
Chloride: 84 mmol/L — ABNORMAL LOW (ref 98–111)
Creatinine, Ser: 1.28 mg/dL — ABNORMAL HIGH (ref 0.44–1.00)
GFR, Estimated: 50 mL/min — ABNORMAL LOW (ref >60)
Glucose, Bld: 244 mg/dL — ABNORMAL HIGH (ref 70–99)
Potassium: 3.8 mmol/L (ref 3.5–5.1)
Sodium: 136 mmol/L (ref 135–145)

## 2020-08-05 LAB — PREPARE FRESH FROZEN PLASMA: Unit division: 0

## 2020-08-05 LAB — MAGNESIUM: Magnesium: 2.7 mg/dL — ABNORMAL HIGH (ref 1.7–2.4)

## 2020-08-05 LAB — PROTIME-INR
INR: 2.2 — ABNORMAL HIGH (ref 0.8–1.2)
Prothrombin Time: 23.4 seconds — ABNORMAL HIGH (ref 11.4–15.2)

## 2020-08-05 IMAGING — NM NM HEPATOBILIARY IMAGE, INC GB
2 series · 12 of 12 positions shown · non-contrast
Comparison: None.

CLINICAL DATA: Upper abdominal pain

EXAM:
NUCLEAR MEDICINE HEPATOBILIARY IMAGING
TECHNIQUE: Sequential images of the abdomen were obtained [DATE] minutes
following intravenous administration of radiopharmaceutical.
RADIOPHARMACEUTICALS:  5.2 mCi Pc-88m  Choletec IV

[he hepatobiliary · 4.52mm/px · 6 of 15 frames shown (1 of 2)]
[frame 2/15]
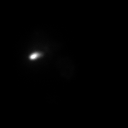
[frame 4/15]
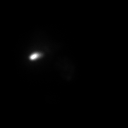
[frame 7/15]
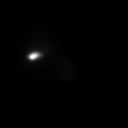
[frame 9/15]
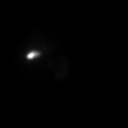
[frame 12/15]
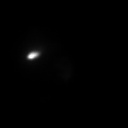
[frame 14/15]
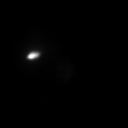

[he hepatobiliary · 4.52mm/px · 6 of 60 frames shown (2 of 2)]
[frame 6/60]
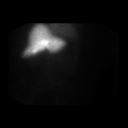
[frame 16/60]
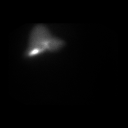
[frame 26/60]
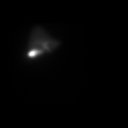
[frame 36/60]
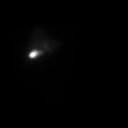
[frame 46/60]
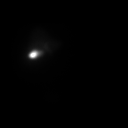
[frame 56/60]
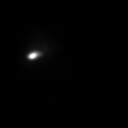

[12 of 12 positions shown; findings below may reference images not displayed]

FINDINGS: Prompt uptake and biliary excretion of activity by the liver is
seen. Gallbladder activity is visualized, consistent with patency of
cystic duct. Biliary activity passes into small bowel, consistent
with patent common bile duct.
IMPRESSION: Study within normal limits.

## 2020-08-05 MED ORDER — INSULIN ASPART 100 UNIT/ML ~~LOC~~ SOLN
5.0000 [IU] | Freq: Three times a day (TID) | SUBCUTANEOUS | Status: DC
  Administered 2020-08-06 – 2020-08-07 (×5): 5 [IU] via SUBCUTANEOUS

## 2020-08-05 MED ORDER — INSULIN GLARGINE 100 UNIT/ML ~~LOC~~ SOLN
20.0000 [IU] | Freq: Every day | SUBCUTANEOUS | Status: DC
  Administered 2020-08-06 – 2020-08-07 (×2): 20 [IU] via SUBCUTANEOUS
  Filled 2020-08-05 (×3): qty 0.2

## 2020-08-05 MED ORDER — PREDNISONE 20 MG PO TABS
60.0000 mg | ORAL_TABLET | Freq: Every day | ORAL | Status: DC
  Administered 2020-08-07: 60 mg via ORAL
  Filled 2020-08-05 (×2): qty 3

## 2020-08-05 MED ORDER — INSULIN ASPART 100 UNIT/ML ~~LOC~~ SOLN
25.0000 [IU] | Freq: Once | SUBCUTANEOUS | Status: AC
  Administered 2020-08-05: 25 [IU] via SUBCUTANEOUS

## 2020-08-05 MED ORDER — BUMETANIDE 1 MG PO TABS: 4.0000 mg | ORAL_TABLET | Freq: Two times a day (BID) | ORAL | Status: DC

## 2020-08-05 MED ORDER — INSULIN ASPART 100 UNIT/ML ~~LOC~~ SOLN
15.0000 [IU] | Freq: Once | SUBCUTANEOUS | Status: AC
  Administered 2020-08-05: 15 [IU] via SUBCUTANEOUS

## 2020-08-05 MED ORDER — ZOLPIDEM TARTRATE 5 MG PO TABS
5.0000 mg | ORAL_TABLET | Freq: Once | ORAL | Status: AC
  Administered 2020-08-06: 5 mg via ORAL
  Filled 2020-08-05: qty 1

## 2020-08-05 MED ORDER — BUMETANIDE 1 MG PO TABS
6.0000 mg | ORAL_TABLET | Freq: Every day | ORAL | Status: DC
  Administered 2020-08-06 – 2020-08-07 (×2): 6 mg via ORAL
  Filled 2020-08-05 (×2): qty 6

## 2020-08-05 MED ORDER — BISACODYL 10 MG RE SUPP
10.0000 mg | Freq: Once | RECTAL | Status: AC
  Administered 2020-08-05: 10 mg via RECTAL
  Filled 2020-08-05: qty 1

## 2020-08-05 MED ORDER — POLYETHYLENE GLYCOL 3350 17 G PO PACK
17.0000 g | PACK | Freq: Two times a day (BID) | ORAL | Status: DC
  Administered 2020-08-06 – 2020-08-07 (×3): 17 g via ORAL
  Filled 2020-08-05 (×4): qty 1

## 2020-08-05 NOTE — Progress Notes (Signed)
ANTICOAGULATION CONSULT NOTE   Pharmacy Consult for Warfarin Indication: lupus anticoagulant  Syndrome/afib   Patient Measurements: Height: 4\' 10"  (147.3 cm) Weight: 93.4 kg (205 lb 14.6 oz) IBW/kg (Calculated) : 40.9  Vital Signs: Temp: 97.6 F (36.4 C) (11/30 1100) Temp Source: Oral (11/30 1100) BP: 99/65 (11/30 1100) Pulse Rate: 73 (11/30 1100)  Labs: Recent Labs    08/03/20 0446 08/03/20 0446 08/04/20 0534 08/05/20 0431  HGB 14.1   < > 13.8 12.8  HCT 44.2  --  42.5 39.9  PLT 241  --  269 252  LABPROT 35.7*  --  43.0* 23.4*  INR 3.7*  --  4.7* 2.2*  CREATININE 1.49*  --  1.41* 1.28*   < > = values in this interval not displayed.    Estimated Creatinine Clearance: 49.1 mL/min (A) (by C-G formula based on SCr of 1.28 mg/dL (H)).  Assessment: 54 yr old female presented to [facility] ED with SOB. Medical hx includes lupus anticoagulant with hx of VTE, for which she is on anticoagulation with warfarin PTA (5 mg MWF, 2.5 mg other days of the week; per med rec, last dose was on the evening of 07/31/20). Pt also has recent dx of atrial flutter.  Was started on amiodarone recently on last admission.   Major Drug Interaction: Target a 25- 50% reduction in weekly warfarin dose after 2 weeks of dual therapy with amiodarone and warfarin  INR 2.7 on admission - Last dose 11/25 per patient CBC WNL  INR 3.7>>4.7 >> 2.2  Vitamin K 2.5 mg x 1 dose on 11/29  Goal of Therapy:  INR 2-3 Monitor platelets by anticoagulation protocol: Yes   Plan:  Hold warfarin x 1 dose tonight- still blood in BM and drop in H&H Consider weekly dose reduction of warfarin d/t major interaction w/ amiodarone Daily INR and CBC Monitor for signs/symptoms of bleeding   Margot Ables, PharmD Clinical Pharmacist 08/05/2020 1:08 PM

## 2020-08-05 NOTE — Progress Notes (Signed)
PT Cancellation Note  Patient Details Name: Theresa [name] MRN: [id number] DOB: 21-Jul-1966   Cancelled Treatment:    Reason Eval/Treat Not Completed: Medical issues which prohibited therapy.  Patient transferred to a higher level of care and will need new PT consult resume therapy when patient is medically stable.  Thank you.    11:08 AM, 08/05/20 Lonell Grandchild, MPT Physical Therapist with [facility] [phone] office [phone] mobile phone

## 2020-08-05 NOTE — Progress Notes (Signed)
Inpatient Diabetes Program Recommendations  AACE/ADA: New Consensus Statement on Inpatient Glycemic Control (2015)  Target Ranges:  Prepandial:   less than 140 mg/dL      Peak postprandial:   less than 180 mg/dL (1-2 hours)      Critically ill patients:  140 - 180 mg/dL  Results for IXCHEL, DUCK (MRN [id number]) as of 08/05/2020 07:23  Ref. Range 08/05/2020 04:31  Glucose Latest Ref Range: 70 - 99 mg/dL 244 (H)   Results for LIORA, MYLES (MRN [id number]) as of 08/05/2020 07:23  Ref. Range 08/04/2020 07:28 08/04/2020 11:11 08/04/2020 16:27 08/04/2020 17:08 08/04/2020 19:01 08/04/2020 20:19  Glucose-Capillary Latest Ref Range: 70 - 99 mg/dL 377 (H)  Novolog 15 units@8 :38 358 (H)  Novolog 15 units@11 :31  Lantus 10 units@11 :31 67 (L) 66 (L) 194 (H) 242 (H)  Novolog 2 units   Review of Glycemic Control  Diabetes history: DM2 Outpatient Diabetes medications: Tresiba 30unitsQHS, Novolog 0-10unitsTID, Ozempic 0.5 mg Qweek (Wednesday) Current orders for Inpatient glycemic control: Lantus 10 units daily, Novolog 0-15 units TID with meals, Novolog 0-5 units QHS; Solumedrol 80 mg Q12H  Inpatient Diabetes Program Recommendations:    Insulin: Please consider decreasing Novolog correction scale to 0-9 units TID with meals.  If steroids are continued as ordered, please consider increasing Lantus to 15 units daily. If patient starts eating at least 50% of meals, would recommend ordering Novolog 3 units TID with meals for meal coverage (if patient eats at least 50% of meals).  Thanks, Barnie Alderman, RN, MSN, CDE Diabetes Coordinator Inpatient Diabetes Program [phone] (Team Pager from 8am to 5pm)

## 2020-08-05 NOTE — Progress Notes (Signed)
Due to pt continuous spo2 in mid 80's Pt trach collar increased to 35% (8lpm) after breathing treatment. Pt will not keep [place] in her nose and mouth breathing, Her spo2 increase to mid to high 90's when she's awake and talking. Will wean back down in the am. Pt in no distress

## 2020-08-05 NOTE — Evaluation (Signed)
Physical Therapy Evaluation Patient Details Name: Theresa [name] MRN: [id number] DOB: 01/19/1966 Today's Date: 08/05/2020   History of Present Illness  Theresa [name] is a 54 year old female with systolic and diastolic CHF, COPD, continued tobacco abuse, diabetes mellitus type 2, lupus anticoagulant syndrome, coronary artery disease with history of STEMI, hypertension, chronic respiratory failure on 4 L, chronic tracheostomy presenting with 2 day history of dizziness, worsening generalized weakness, and near syncope.  Notably, the patient was recently admitted to the hospital from 07/21/2020 to 07/29/2020 when she was treated for acute on chronic respiratory failure secondary to decompensated CHF.  The patient was discharged home with Bumex 6 mg in the morning and 4 mg in the evening.  It appeared that her discharge weight was 93.3 kg (205.7 lbs).  The patient endorsed compliance with her Bumex at home.  However she continued to have poor oral intake.  She stated that she has had episodes of dizziness and generalized weakness requiring increasing help from her family.  She was try to get up off the commode and was so weak she needed help.  Her family was not able to help her so EMS was activated.  Since discharge from the hospital, the patient denies any worsening shortness of breath.  She denied any fevers, chills, chest pain, hemoptysis, nausea, vomiting, diarrhea, abdominal pain, headache, visual disturbance, focal extremity weakness.  She denies any dysuria, hematuria, hematochezia, melena.  She states that she has had episodes of near syncope where she describes the her visual field becomes black for short period of time.  This can usually happen even when she is at rest watching television.  EMS was activated, and the patient was taken to [facility]  BMP showed a sodium 133, potassium 4.1, chloride 87, CO2 41, BUN 86, creatinine 1.76.  WBC 19.6, hemoglobin 16.3, platelets 262,000.  Troponin 73>> 77.  LFTs  were unremarkable.  Magnesium was 2.9.  Chest x-ray showed bilateral patchy infiltrates.  The patient requested transfer to [facility] for further evaluation and treatment.    Clinical Impression  Patient demonstrates slow labored movement for sitting up at bedside with c/o discomfort over rectal area when scooting to edge of bed, slow labored cadence without loss of balance, limited for ambulation mostly due to c/o fatigue and requested to go back to bed after therapy.  Patient on 5 LPM [place], 8 LPM over trach with SpO2 at 98-100% throughout visit.  Patient will benefit from continued physical therapy in hospital and recommended venue below to increase strength, balance, endurance for safe ADLs and gait.     Follow Up Recommendations SNF    Equipment Recommendations  None recommended by PT    Recommendations for Other Services       Precautions / Restrictions Precautions Precautions: Fall Restrictions Weight Bearing Restrictions: No      Mobility  Bed Mobility Overal bed mobility: Needs Assistance Bed Mobility: Supine to Sit;Sit to Supine     Supine to sit: Supervision Sit to supine: Supervision   General bed mobility comments: increased time, labored movement    Transfers Overall transfer level: Needs assistance Equipment used: Rolling walker (2 wheeled) Transfers: Sit to/from [facility] Sit to Stand: Min assist Stand pivot transfers: Min assist       General transfer comment: increased time, labored movement  Ambulation/Gait Ambulation/Gait assistance: Min assist Gait Distance (Feet): 15 Feet Assistive device: Rolling walker (2 wheeled) Gait Pattern/deviations: Decreased step length - right;Decreased step length - left;Decreased stride  length Gait velocity: decreased   General Gait Details: slow labored cadence without loss of balance, limited mostly due to c/o fatigue  Stairs            Wheelchair Mobility    Modified Rankin  (Stroke Patients Only)       Balance Overall balance assessment: Needs assistance Sitting-balance support: Feet supported;No upper extremity supported Sitting balance-Leahy Scale: Good Sitting balance - Comments: seated at EOB   Standing balance support: During functional activity;Bilateral upper extremity supported Standing balance-Leahy Scale: Fair Standing balance comment: using RW                             Pertinent Vitals/Pain Faces Pain Scale: Hurts little more Pain Location: rectal area Pain Descriptors / Indicators: Sore Pain Intervention(s): Limited activity within patient's tolerance;Monitored during session;Repositioned    Home Living Family/patient expects to be discharged to:: Private residence Living Arrangements: Spouse/significant other Available Help at Discharge: Family;Available 24 hours/day Type of Home: House Home Access: Stairs to enter Entrance Stairs-Rails: None Entrance Stairs-Number of Steps: 2-3 Home Layout: One level Home Equipment: Walker - 2 wheels;Cane - single point;Bedside commode;Wheelchair - manual;Tub bench;Shower seat      Prior Function Level of [place]: Needs assistance   Gait / Transfers Assistance Needed: Household ambulator leaning on walls and nearby objects  ADL's / Homemaking Assistance Needed: assisted by family  Comments: Pt states she used RW or cane at times     Hand Dominance        Extremity/Trunk Assessment   Upper Extremity Assessment Upper Extremity Assessment: Generalized weakness    Lower Extremity Assessment Lower Extremity Assessment: Generalized weakness    Cervical / Trunk Assessment Cervical / Trunk Assessment: Normal  Communication   Communication: No difficulties  Cognition Arousal/Alertness: Awake/alert Behavior During Therapy: WFL for tasks assessed/performed Overall Cognitive Status: Within Functional Limits for tasks assessed                                         General Comments      Exercises     Assessment/Plan    PT Assessment Patient needs continued PT services  PT Problem List Decreased strength;Decreased activity tolerance;Decreased balance;Decreased mobility;Pain       PT Treatment Interventions Gait training;Stair training;Functional mobility training;DME instruction;Therapeutic activities;Therapeutic exercise;Patient/family education;Balance training;Neuromuscular re-education    PT Goals (Current goals can be found in the Care Plan section)  Acute Rehab PT Goals Patient Stated Goal: go to rehab and get stronger and return home PT Goal Formulation: With patient Time For Goal Achievement: 08/13/20 Potential to Achieve Goals: Good    Frequency Min 3X/week   Barriers to discharge        Co-evaluation               AM-PAC PT "6 Clicks" Mobility  Outcome Measure Help needed turning from your back to your side while in a flat bed without using bedrails?: None Help needed moving from lying on your back to sitting on the side of a flat bed without using bedrails?: A Little Help needed moving to and from a bed to a chair (including a wheelchair)?: A Little Help needed standing up from a chair using your arms (e.g., wheelchair or bedside chair)?: A Little Help needed to walk in hospital room?: A Little Help needed climbing 3-5 steps with  a railing? : A Lot 6 Click Score: 18    End of Session Equipment Utilized During Treatment: Oxygen Activity Tolerance: Patient tolerated treatment well;Patient limited by fatigue Patient left: in bed;with call bell/phone within reach Nurse Communication: Mobility status PT Visit Diagnosis: Unsteadiness on feet (R26.81);Other abnormalities of gait and mobility (R26.89);Muscle weakness (generalized) (M62.81)    Time: 0258-5277 PT Time Calculation (min) (ACUTE ONLY): 26 min   Charges:   PT Evaluation $PT Eval Moderate Complexity: 1 Mod PT Treatments $Therapeutic  Activity: 23-37 mins        3:41 PM, 08/05/20 Lonell Grandchild, MPT Physical Therapist with [facility] [phone] office [phone] mobile phone

## 2020-08-05 NOTE — Progress Notes (Addendum)
Subjective: Rectal soreness is improving. Had 2 additional BMs yesterday that were very large in caliber and required straining. Per nursing staff, there was a scant amount of blood with one BM yesterday evening. Denies any significant abdominal pain, nausea or vomiting.   During abdominal exam, patient has mild tenderness to palpation across the upper abdomen and in LLQ. States she doesn't really notice this unless I am pressing on her abdomen. Not affected by meals or BMs. No GERD symptoms.   Objective: Vital signs in last 24 hours: Temp:  [97.4 F (36.3 C)-98.2 F (36.8 C)] 97.6 F (36.4 C) (11/30 0731) Pulse Rate:  [48-108] 81 (11/30 0731) Resp:  [11-32] 27 (11/30 0731) BP: (95-161)/(44-103) 118/61 (11/30 0400) SpO2:  [83 %-98 %] 94 % (11/30 0731) FiO2 (%):  [28 %-35 %] 28 % (11/30 0210) Last BM Date: 08/04/20 General:   Alert and oriented, pleasant Head:  Normocephalic and atraumatic. Eyes:  No icterus, sclera clear. Conjuctiva pink.  Abdomen:  Bowel sounds present, soft, non-distended. Mild TTP across upper abdomen and in LLQ. No HSM or hernias noted. No rebound or guarding. No masses appreciated  Msk:  Symmetrical without gross deformities. Normal posture. Neurologic:  Alert and  oriented x4;  grossly normal neurologically. Psych:  Normal mood and affect.  Intake/Output from previous day: 11/29 0701 - 11/30 0700 In: 116.7 [I.V.:10; Blood:106.7] Out: 200 [Urine:200] Intake/Output this shift: No intake/output data recorded.  Lab Results: Recent Labs    08/03/20 0446 08/04/20 0534 08/05/20 0431  WBC 16.3* 19.2* 16.9*  HGB 14.1 13.8 12.8  HCT 44.2 42.5 39.9  PLT 241 269 252   BMET Recent Labs    08/03/20 0446 08/04/20 0534 08/05/20 0431  NA 132* 130* 136  K 3.6 3.3* 3.8  CL 79* 78* 84*  CO2 37* 36* 39*  GLUCOSE 359* 412* 244*  BUN 83* 79* 76*  CREATININE 1.49* 1.41* 1.28*  CALCIUM 8.4* 8.7* 8.9   LFT Recent Labs    08/03/20 0446  PROT 6.6   ALBUMIN 3.1*  AST 20  ALT 25  ALKPHOS 79  BILITOT 1.9*   PT/INR Recent Labs    08/04/20 0534 08/05/20 0431  LABPROT 43.0* 23.4*  INR 4.7* 2.2*   Assessment: 54 year old female with multiple medical issues (CHF, DVTs on Coumadin, COPD, diabetes, lupus anticoagulant syndrome, CAD, history of STEMI and CABG, chronic respiratory failure, chronic trachsince 2002), admitted with acute on chronic respiratory failure with hypoxia in setting of COPD exacerbation, found to have rectal bleeding in setting of chronic constipation, fecal impaction, supratherapeutic INR.   Limited rectal exam yesterday without obvious fissure, no prolapsing hemorrhoids, no external hemorrhoids, no overt bleeding. She noted improvement in rectal discomfort after passing stool yesterday. She was started on Anusol suppositories BID and MiraLAX daily yesterday. One additional episode of scant rectal bleeding in the setting of passing large hard BMs yesterday evening. Rectal discomfort continues to improve and is just a soreness. Hemoglobin declined from 13.8 yesterday to 12.8 today. She has received vitamin K yesterday with INR improved to 2.2 today from 4.7 yesterday. Will continue to monitor for now. She will need first ever colonoscopy as outpatient unless she were to continue with overt GI bleeding or develop acute blood loss anemia.   Abdominal Tenderness: Notably, patient has mild TTP across the upper abdomen and LLQ. Denies any significant pain when I am not palpating. Not affected by eating or BMs. GERD well controlled on PPI daily. This could be secondary  to frequent coughing/muscle strain or constipation. Continue to monitor. If pain continues despite improvement in constipation or if she has any worsening abdominal pain, will have low threshold for CT A/P.   Plan: Dulcolax suppository today to see if this will soften stools up that are in the rectum to allow for easier evacuation.  Continue Anusol BID. Increase  MiraLAX to BID for now. May be also to reduce to once daily once bowels are moving better.  Continue to follow H/H.  Monitor for persistent or worsening abdominal pain despite improvement in constipation. Low threshold for abdominal imaging.  Continue supportive measures.  Colonoscopy as an outpatient unless worsening bleeding or acute blood loss anemia.    LOS: 4 days    08/05/2020, 8:10 AM   Aliene Altes, [facility]

## 2020-08-05 NOTE — Progress Notes (Signed)
PROGRESS NOTE  Theresa [name] WEX:[id number] DOB: 10-Mar-1966 DOA: 08/01/2020 PCP: Monico Blitz, MD  Brief History: 54 year old female with systolic and diastolic CHF, COPD, continued tobacco abuse, diabetes mellitus type 2, lupus anticoagulant syndrome, coronary artery disease with history of STEMI, hypertension, chronic respiratory failure on 4 L, chronic tracheostomy presenting with2day history of dizziness, worsening generalized weakness, and near syncope. Notably, the patient was recently admitted to the hospital from 07/21/2020 to 07/29/2020 when she was treated for acute on chronic respiratory failure secondary to decompensated CHF. The patient was discharged home with Bumex 6 mg in the morning and 4 mg in the evening. It appeared that her discharge weight was 93.3 kg (205.7 lbs). The patient endorsed compliance with her Bumex at home. However she continued to have poor oral intake. She stated that she has had episodes of dizziness and generalized weakness requiring increasing help from her family. She was try to get up off the commode and was so weak she needed help. Her family was not able to help her so EMS was activated. Since discharge from the hospital, the patient denies any worsening shortness of breath. She denied any fevers, chills, chest pain, hemoptysis, nausea, vomiting, diarrhea, abdominal pain, headache, visual disturbance, focal extremity weakness. She denies any dysuria, hematuria, hematochezia, melena. She states that she has had episodes of near syncope where she describes the her visual field becomes black for short period of time. This can usually happen even when she is at rest watching television. EMS was activated, and the patient was taken to [facility].BMP showed a sodium 133, potassium 4.1, chloride 87, CO2 41, BUN 86, creatinine 1.76. WBC 19.6, hemoglobin 16.3, platelets 262,000. Troponin 73>>77. LFTs were unremarkable. Magnesium was 2.9. Chest  x-ray showed bilateral patchy infiltrates. The patient requested transfer to [facility] for further evaluation and treatment.   Assessment/Plan: Acute on chronic respiratory failure with hypoxia -due to COPD exacerbation -usually on 4L trach collar at home -currently on 8L trach collar (40% FiO2)>>5L -wean oxygen as tolerated back to baseline -11/27 CT chest--Hazy b/L GGO improving vs 04/18/20; bandlike densities upper and lower lobes; stable T9 compression fx -BNP 344 -PCT <0.10 x 2  COPD Exacerbation -continuesolumedrol to 80 mg IV q 12hours -continue duonebs -continue pulmicort -continuebrovana -11/26 evening--pt had mucus plug-->respiratory distress and moved to stepdown unit   Rectal Pain/Hematochezia -suspect proctitis from abundant stool in rectal vault -rectal exam--no fissure, no visible lesion, abundant stool in rectal vault; Hemoccult negative -try anusol suppositories -GI consulted--continue anusol, supportive care -hold warfarin -amount hematochezia decreased significantly in last 24 hours--only small amount 11/30 -Hgb dropped 1 gram 11/29>>11/30 -am CBC  Supratherapeutic INR -vitamin K 2.5 mg x 1 given 11/29 -FFP x 1 in setting of active bleed given 11/29 -holding warfarin temporarily -INR 4.7>>2.2  Generalized weakness -This is likely multifactorial including acute on chronic renal failure secondary to her diuresis -Obtain UA--no pyuria -LFY--1.017 -P10--258 -Folic NIDP--82.4 -PT evaluation--SNF  Acute on chronic renal failure--CKD stage IIIa -Baseline creatinine 1.0-1.2 -serum creatinine peaked 1.76 at [facility] on 11/25 -Holding Bumex x 4 days-->restart am 12/1 at slightly lower dose-->6 mg daily (previously 6 mg AM, 4 mg PM)  Atrial flutter -Remain on telemetry -going in and out of a flutter, rate controlled -Continue amiodarone  Chronic systolic and diastolic CHF -2/35/3614 echo EF 40-45%, grade 3 DD -07/02/20 echo EF 50%,  global HK, indeterminant diastolic -holding bumex last 4 days-->restart am 12/1 -clinically  euvolemic  Lupus anticoagulant syndrome -coumadin with pharmacy assistance -11/29 holding warfarin today>>11/30 holding again  Coronary artery disease -No chest pain presently  Uncontrolled diabetes mellitus type 2 with hyperglycemia -increase lantus to 20 units -NovoLog sliding scale -06/16/20-hemoglobin A1c--8.9 -08/01/20 A1c--8.2 -add novolog 5 units with meals -holding Ozempic  Depression -Continue sertraline and home dose lorazepam  Bladder spasm -this is likely the etiology her vague vaginal pain -d/c foley catheter  Hyponatremia -due to liver cirrhosis        Status is: Inpatient  Remains inpatient appropriate because:IV treatments appropriate due to intensity of illness or inability to take PO   Dispo: The patient is from:Home Anticipated d/c is NA:TFTD Anticipated d/c date is: 2 days Patient currently is not medically stable to d/c.        Family Communication:Spouse updatedat bedside 11/27  Consultants:none  Code Status: FULL  DVT Prophylaxis:warfarin   Procedures: As Listed in Progress Note Above  Antibiotics: None     Subjective: Patient feels better breathing.  Patient denies fevers, chills, headache, chest pain, dyspnea, nausea, vomiting, diarrhea, abdominal pain, dysuria, hematuria, and melena.  Had BM today with small amount hematochezia   Objective: Vitals:   08/05/20 1100 08/05/20 1311 08/05/20 1530 08/05/20 1617  BP: 99/65 122/85  103/63  Pulse: 73 84  64  Resp: 16 18  16   Temp: 97.6 F (36.4 C) 97.9 F (36.6 C)  97.8 F (36.6 C)  TempSrc: Oral Oral    SpO2: 100% 98% 96% 100%  Weight:      Height:        Intake/Output Summary (Last 24 hours) at 08/05/2020 1650 Last data filed at 08/04/2020 1843 Gross per 24 hour  Intake --  Output 200 ml   Net -200 ml   Weight change:  Exam:   General:  Pt is alert, follows commands appropriately, not in acute distress  HEENT: No icterus, No thrush, No neck mass, [place]/AT  Cardiovascular: RRR, S1/S2, no rubs, no gallops  Respiratory: bibasilar rales.  No wheeze  Abdomen: Soft/+BS, non tender, non distended, no guarding  Extremities: trace LE edema, No lymphangitis, No petechiae, No rashes, no synovitis   Data Reviewed: I have personally reviewed following labs and imaging studies Basic Metabolic Panel: Recent Labs  Lab 08/01/20 1251 08/02/20 0427 08/03/20 0446 08/04/20 0534 08/05/20 0431  NA 133* 134* 132* 130* 136  K 3.7 4.0 3.6 3.3* 3.8  CL 78* 80* 79* 78* 84*  CO2 40* 38* 37* 36* 39*  GLUCOSE 76 172* 359* 412* 244*  BUN 85* 85* 83* 79* 76*  CREATININE 1.47* 1.53* 1.49* 1.41* 1.28*  CALCIUM 8.7* 8.5* 8.4* 8.7* 8.9  MG  --   --   --  2.6* 2.7*   Liver Function Tests: Recent Labs  Lab 08/01/20 1251 08/03/20 0446  AST 22 20  ALT 27 25  ALKPHOS 91 79  BILITOT 1.6* 1.9*  PROT 7.1 6.6  ALBUMIN 3.6 3.1*   No results for input(s): LIPASE, AMYLASE in the last 168 hours. No results for input(s): AMMONIA in the last 168 hours. Coagulation Profile: Recent Labs  Lab 08/01/20 1251 08/02/20 0427 08/03/20 0446 08/04/20 0534 08/05/20 0431  INR 2.7* 2.7* 3.7* 4.7* 2.2*   CBC: Recent Labs  Lab 08/01/20 1251 08/02/20 0427 08/03/20 0446 08/04/20 0534 08/05/20 0431  WBC 20.0* 15.8* 16.3* 19.2* 16.9*  HGB 15.9* 15.7* 14.1 13.8 12.8  HCT 48.9* 48.4* 44.2 42.5 39.9  MCV 96.6 97.4 98.0 97.3 98.5  PLT  245 232 241 269 252   Cardiac Enzymes: No results for input(s): CKTOTAL, CKMB, CKMBINDEX, TROPONINI in the last 168 hours. BNP: Invalid input(s): POCBNP CBG: Recent Labs  Lab 08/04/20 2019 08/05/20 0731 08/05/20 1139 08/05/20 1326 08/05/20 1619  GLUCAP 242* 307* 482* 449* 464*   HbA1C: No results for input(s): HGBA1C in the last 72 hours. Urine  analysis:    Component Value Date/Time   COLORURINE YELLOW 08/01/2020 1539   APPEARANCEUR CLEAR 08/01/2020 1539   LABSPEC 1.012 08/01/2020 1539   PHURINE 6.0 08/01/2020 1539   GLUCOSEU NEGATIVE 08/01/2020 1539   HGBUR MODERATE (A) 08/01/2020 1539   BILIRUBINUR NEGATIVE 08/01/2020 1539   KETONESUR NEGATIVE 08/01/2020 1539   PROTEINUR 30 (A) 08/01/2020 1539   NITRITE NEGATIVE 08/01/2020 1539   LEUKOCYTESUR NEGATIVE 08/01/2020 1539   Sepsis Labs: @LABRCNTIP (procalcitonin:4,lacticidven:4) ) Recent Results (from the past 240 hour(s))  Culture, Urine     Status: Abnormal   Collection Time: 08/01/20  3:39 PM   Specimen: Urine, Clean Catch  Result Value Ref Range Status   Specimen Description   Final    URINE, CLEAN CATCH Performed at [facility], [address] [place], [place] [address]    Special Requests   Final    NONE Performed at [facility], [address], [place], [place] [address]    Culture >=100,000 COLONIES/mL ENTEROCOCCUS FAECALIS (A)  Final   Report Status 08/04/2020 FINAL  Final   Organism ID, Bacteria ENTEROCOCCUS FAECALIS (A)  Final      Susceptibility   Enterococcus faecalis - MIC*    AMPICILLIN <=2 SENSITIVE Sensitive     NITROFURANTOIN <=16 SENSITIVE Sensitive     VANCOMYCIN 1 SENSITIVE Sensitive     * >=100,000 COLONIES/mL ENTEROCOCCUS FAECALIS  MRSA PCR Screening     Status: None   Collection Time: 08/01/20  6:05 PM   Specimen: Nasopharyngeal  Result Value Ref Range Status   MRSA by PCR NEGATIVE NEGATIVE Final    Comment:        The GeneXpert MRSA Assay (FDA approved for NASAL specimens only), is one component of a comprehensive MRSA colonization surveillance program. It is not intended to diagnose MRSA infection nor to guide or monitor treatment for MRSA infections. Performed at [facility], [address], [place], [place] [address]   Resp Panel by RT-PCR (Flu A&B, Covid) Nasopharyngeal Swab     Status: None   Collection Time: 08/01/20   6:05 PM   Specimen: Nasopharyngeal Swab; Nasopharyngeal(NP) swabs in vial transport medium  Result Value Ref Range Status   SARS Coronavirus 2 by RT PCR NEGATIVE NEGATIVE Final    Comment: (NOTE) SARS-CoV-2 target nucleic acids are NOT DETECTED.  The SARS-CoV-2 RNA is generally detectable in upper respiratory specimens during the acute phase of infection. The lowest concentration of SARS-CoV-2 viral copies this assay can detect is 138 copies/mL. A negative result does not preclude SARS-Cov-2 infection and should not be used as the sole basis for treatment or other patient management decisions. A negative result may occur with  improper specimen collection/handling, submission of specimen other than nasopharyngeal swab, presence of viral mutation(s) within the areas targeted by this assay, and inadequate number of viral copies(<138 copies/mL). A negative result must be combined with clinical observations, patient history, and epidemiological information. The expected result is Negative.  Fact Sheet for Patients:  EntrepreneurPulse.com.au  Fact Sheet for Healthcare Providers:  IncredibleEmployment.be  This test is no t yet approved or cleared by the [place] FDA  and  has been authorized for detection and/or diagnosis of SARS-CoV-2 by FDA under an Emergency Use Authorization (EUA). This EUA will remain  in effect (meaning this test can be used) for the duration of the COVID-19 declaration under Section 564(b)(1) of the Act, 21 U.S.C.section 360bbb-3(b)(1), unless the authorization is terminated  or revoked sooner.       Influenza A by PCR NEGATIVE NEGATIVE Final   Influenza B by PCR NEGATIVE NEGATIVE Final    Comment: (NOTE) The Xpert Xpress SARS-CoV-2/FLU/RSV plus assay is intended as an aid in the diagnosis of influenza from Nasopharyngeal swab specimens and should not be used as a sole basis for treatment. Nasal washings and aspirates  are unacceptable for Xpert Xpress SARS-CoV-2/FLU/RSV testing.  Fact Sheet for Patients: EntrepreneurPulse.com.au  Fact Sheet for Healthcare Providers: IncredibleEmployment.be  This test is not yet approved or cleared by the [place] FDA and has been authorized for detection and/or diagnosis of SARS-CoV-2 by FDA under an Emergency Use Authorization (EUA). This EUA will remain in effect (meaning this test can be used) for the duration of the COVID-19 declaration under Section 564(b)(1) of the Act, 21 U.S.C. section 360bbb-3(b)(1), unless the authorization is terminated or revoked.  Performed at [facility], [address], [place], [place] [address]      Scheduled Meds:  amiodarone  200 mg Oral Daily   arformoterol  15 mcg Nebulization BID   budesonide (PULMICORT) nebulizer solution  0.5 mg Nebulization BID   Chlorhexidine Gluconate Cloth  6 each Topical Daily   hydrocortisone  25 mg Rectal BID   insulin aspart  0-15 Units Subcutaneous TID WC   insulin aspart  0-5 Units Subcutaneous QHS   insulin aspart  25 Units Subcutaneous Once   [START ON 08/06/2020] insulin aspart  5 Units Subcutaneous TID WC   [START ON 08/06/2020] insulin glargine  20 Units Subcutaneous Daily   ipratropium-albuterol  3 mL Nebulization Q6H   methylPREDNISolone (SOLU-MEDROL) injection  80 mg Intravenous Q12H   pantoprazole  40 mg Oral Daily   polyethylene glycol  17 g Oral BID   sertraline  50 mg Oral QHS   tamsulosin  0.4 mg Oral Daily   Warfarin - Pharmacist Dosing Inpatient   Does not apply q1600   Continuous Infusions:  Procedures/Studies: CT CHEST WO CONTRAST  Result Date: 08/01/2020 CLINICAL DATA:  Cough.  Shortness of breath.  Pneumonia. EXAM: CT CHEST WITHOUT CONTRAST TECHNIQUE: Multidetector CT imaging of the chest was performed following the standard protocol without IV contrast. COMPARISON:  Chest radiograph 08/01/2020 and chest CT from  04/18/2020 FINDINGS: Cardiovascular: Coronary, aortic arch, and branch vessel atherosclerotic vascular disease. Mild cardiomegaly. Prior CABG. Mediastinum/Nodes: Scattered mediastinal lymph nodes are present. 1.0 cm right lower paratracheal node on image 41 of series 2, previously 1.4 cm. Lungs/Pleura: Tracheostomy tube in place. Emphysema. Hazy ground-glass opacities throughout both lungs, but substantially improved from 04/18/2020, possibly from improving pulmonary edema. Bandlike densities tracking in both lower lobes and to a lesser extent in both upper lobes with associated volume loss, probably from atelectasis. Upper Abdomen: Cholelithiasis.  Bilateral adrenal adenomas. Musculoskeletal: Stable T9 compression fracture. IMPRESSION: 1. Hazy ground-glass opacities throughout both lungs, but substantially improved from 04/18/2020, possibly from improving pulmonary edema. 2. Bandlike densities tracking in both lower lobes and to a lesser extent in both upper lobes with associated volume loss, probably from atelectasis. 3. Other imaging findings of potential clinical significance: Coronary, aortic arch, and branch vessel atherosclerotic vascular disease. Mild cardiomegaly. Prior  CABG. Cholelithiasis. Bilateral adrenal adenomas. Stable T9 compression fracture. 4. Emphysema and aortic atherosclerosis. Aortic Atherosclerosis (ICD10-I70.0) and Emphysema (ICD10-J43.9). Electronically Signed   By: Van Clines M.D.   On: 08/01/2020 17:10   DG CHEST PORT 1 VIEW  Result Date: 08/02/2020 CLINICAL DATA:  PICC line placement EXAM: PORTABLE CHEST 1 VIEW COMPARISON:  November 26 21 FINDINGS: The heart size remains enlarged. There are improving airspace opacities bilaterally. Band like opacities are again noted bilaterally favored to represent areas of atelectasis. There is no pneumothorax. Postsurgical changes are noted over the right lower lung zone. There is a probable right-sided pleural effusion. The endotracheal  tube terminates above the carina by approximately 4 cm. The left-sided PICC line projects over the right atrium and should be retracted by approximately 4 cm. IMPRESSION: 1. The left-sided PICC line tip projects over the right atrium and should be retracted by approximately 4 cm. 2. Remaining lines and tubes as detailed above. 3. Improving aeration throughout both lungs. These results will be called to the ordering clinician or representative by the Radiologist Assistant, and communication documented in the PACS or [employer]. Electronically Signed   By: Constance Holster M.D.   On: 08/02/2020 19:20   DG Chest Port 1 View  Result Date: 07/28/2020 CLINICAL DATA:  Shortness of breath. EXAM: PORTABLE CHEST 1 VIEW COMPARISON:  July 21, 2020. FINDINGS: Stable cardiomegaly. Tracheostomy tube is in good position. Status post coronary bypass graft. No pneumothorax is noted. Mild bibasilar subsegmental atelectasis is noted with small pleural effusions. Bony thorax is unremarkable. IMPRESSION: Mild bibasilar subsegmental atelectasis with small pleural effusions. Electronically Signed   By: Marijo Conception M.D.   On: 07/28/2020 09:31   DG Chest Port 1 View  Result Date: 07/21/2020 CLINICAL DATA:  Shortness of breath EXAM: PORTABLE CHEST 1 VIEW COMPARISON:  07/03/2020 FINDINGS: Tracheostomy tube remains in place. Post CABG changes. Stable cardiomegaly. There is pulmonary vascular congestion with diffuse bilateral interstitial prominence. Streaky perihilar and bibasilar opacities. No large pleural fluid collection. No pneumothorax. IMPRESSION: Findings suggestive of CHF with pulmonary edema. Streaky perihilar and bibasilar opacities may represent atelectasis, alveolar edema, versus infection. Electronically Signed   By: Davina Poke D.O.   On: 07/21/2020 11:46   EEG adult  Result Date: 08/01/2020 Lora Havens, MD     08/01/2020  4:38 PM Patient Name: VALOREE AGENT MRN: [id number] Epilepsy  Attending: Lora Havens Referring Physician/Provider: Dr Shanon Brow Meeah Totino Date: 08/01/2020 Duration: 23.43 mins Patient history: 54yo with an episode of near syncope. EEG to evaluate for seizure. Level of alertness: Awake AEDs during EEG study: None Technical aspects: This EEG study was done with scalp electrodes positioned according to the 10-20 International system of electrode placement. Electrical activity was acquired at a sampling rate of 500Hz  and reviewed with a high frequency filter of 70Hz  and a low frequency filter of 1Hz . EEG data were recorded continuously and digitally stored. Description: The posterior dominant rhythm consists of 8 Hz activity of moderate voltage (25-35 uV) seen predominantly in posterior head regions, symmetric and reactive to eye opening and eye closing. EEG showed intermittent generalized 3 to 6 Hz theta-delta slowing. Hyperventilation and photic stimulation were not performed.   ABNORMALITY -Intermittent slow, generalized IMPRESSION: This study is suggestive of mild diffuse encephalopathy, nonspecific etiology. No seizures or epileptiform discharges were seen throughout the recording. Priyanka O Yadav   [place] EKG SITE RITE  Result Date: 08/02/2020 If [facility] image not attached, placement could not be  confirmed due to current cardiac rhythm.   Orson Eva, DO  [facility]  If 7PM-7AM, please contact night-coverage [email or web address] Password [email or web address] 08/05/2020, 4:50 PM   LOS: 4 days

## 2020-08-05 NOTE — Progress Notes (Signed)
Patient blood glucose is 464. Notified Dr. Carles Collet. Orders received to administer 25 units of novoLog, rather than her sliding scale. Will administer.

## 2020-08-06 DIAGNOSIS — K5900 Constipation, unspecified: Secondary | ICD-10-CM

## 2020-08-06 DIAGNOSIS — K625 Hemorrhage of anus and rectum: Secondary | ICD-10-CM

## 2020-08-06 DIAGNOSIS — J441 Chronic obstructive pulmonary disease with (acute) exacerbation: Principal | ICD-10-CM

## 2020-08-06 LAB — PROTIME-INR
INR: 1.4 — ABNORMAL HIGH (ref 0.8–1.2)
Prothrombin Time: 16.6 seconds — ABNORMAL HIGH (ref 11.4–15.2)

## 2020-08-06 LAB — BASIC METABOLIC PANEL
Anion gap: 10 (ref 5–15)
BUN: 65 mg/dL — ABNORMAL HIGH (ref 6–20)
CO2: 38 mmol/L — ABNORMAL HIGH (ref 22–32)
Calcium: 8.6 mg/dL — ABNORMAL LOW (ref 8.9–10.3)
Chloride: 85 mmol/L — ABNORMAL LOW (ref 98–111)
Creatinine, Ser: 1.24 mg/dL — ABNORMAL HIGH (ref 0.44–1.00)
GFR, Estimated: 52 mL/min — ABNORMAL LOW (ref >60)
Glucose, Bld: 135 mg/dL — ABNORMAL HIGH (ref 70–99)
Potassium: 3.5 mmol/L (ref 3.5–5.1)
Sodium: 133 mmol/L — ABNORMAL LOW (ref 135–145)

## 2020-08-06 LAB — CBC
HCT: 38 % (ref 36.0–46.0)
Hemoglobin: 12.4 g/dL (ref 12.0–15.0)
MCH: 32 pg (ref 26.0–34.0)
MCHC: 32.6 g/dL (ref 30.0–36.0)
MCV: 98.2 fL (ref 80.0–100.0)
Platelets: 264 10*3/uL (ref 150–400)
RBC: 3.87 MIL/uL (ref 3.87–5.11)
RDW: 15 % (ref 11.5–15.5)
WBC: 15.6 10*3/uL — ABNORMAL HIGH (ref 4.0–10.5)
nRBC: 0 % (ref 0.0–0.2)

## 2020-08-06 LAB — GLUCOSE, CAPILLARY
Glucose-Capillary: 159 mg/dL — ABNORMAL HIGH (ref 70–99)
Glucose-Capillary: 230 mg/dL — ABNORMAL HIGH (ref 70–99)
Glucose-Capillary: 162 mg/dL — ABNORMAL HIGH (ref 70–99)

## 2020-08-06 MED ORDER — NITROFURANTOIN MONOHYD MACRO 100 MG PO CAPS
100.0000 mg | ORAL_CAPSULE | Freq: Two times a day (BID) | ORAL | Status: DC
  Administered 2020-08-06 – 2020-08-07 (×3): 100 mg via ORAL
  Filled 2020-08-06 (×3): qty 1

## 2020-08-06 MED ORDER — WARFARIN SODIUM 2.5 MG PO TABS
2.5000 mg | ORAL_TABLET | Freq: Once | ORAL | Status: AC
  Administered 2020-08-06: 2.5 mg via ORAL
  Filled 2020-08-06: qty 1

## 2020-08-06 NOTE — Progress Notes (Addendum)
ANTICOAGULATION CONSULT NOTE   Pharmacy Consult for Warfarin Indication: lupus anticoagulant  Syndrome/afib   Patient Measurements: Height: 4\' 10"  (147.3 cm) Weight: 93.4 kg (205 lb 14.6 oz) IBW/kg (Calculated) : 40.9  Vital Signs: Temp: 97.6 F (36.4 C) (12/01 0521) Temp Source: Oral (12/01 0521) BP: 116/70 (12/01 0521) Pulse Rate: 74 (12/01 1213)  Labs: Recent Labs    08/04/20 0534 08/04/20 0534 08/05/20 0431 08/06/20 0550  HGB 13.8   < > 12.8 12.4  HCT 42.5  --  39.9 38.0  PLT 269  --  252 264  LABPROT 43.0*  --  23.4* 16.6*  INR 4.7*  --  2.2* 1.4*  CREATININE 1.41*  --  1.28* 1.24*   < > = values in this interval not displayed.    Estimated Creatinine Clearance: 50.7 mL/min (A) (by C-G formula based on SCr of 1.24 mg/dL (H)).  Assessment: 54 yr old female presented to [facility] ED with SOB. Medical hx includes lupus anticoagulant with hx of VTE, for which she is on anticoagulation with warfarin PTA (5 mg MWF, 2.5 mg other days of the week; per med rec, last dose was on the evening of 07/31/20). Pt also has recent dx of atrial flutter.  Was started on amiodarone recently on last admission.   Major Drug Interaction: Target a 25- 50% reduction in weekly warfarin dose after 2 weeks of dual therapy with amiodarone and warfarin  CBC WNL  Vitamin K 2.5 mg x 1 dose ordered 11/29 INR 1.4 today after holding 11/28 > 11/30. Restart per MD Will order dosing based on 50% of previous regimen until stable due to elevated INR recently  Goal of Therapy:  INR 2-3 Monitor platelets by anticoagulation protocol: Yes   Plan:  Warfarin 2.5 mg x 1 dose. Consider weekly dose reduction of warfarin d/t major interaction w/ amiodarone Daily INR and CBC Monitor for signs/symptoms of bleeding   Margot Ables, PharmD Clinical Pharmacist 08/06/2020 1:04 PM

## 2020-08-06 NOTE — Plan of Care (Signed)

## 2020-08-06 NOTE — Progress Notes (Signed)
PROGRESS NOTE    Theresa [name]  BUL:[id number] DOB: October 01, 1965 DOA: 08/01/2020 PCP: Theresa Blitz, MD     Brief Narrative:  Theresa [name] is a 54 year old female with chronic systolic and diastolic CHF, COPD, continued tobacco abuse, diabetes mellitus type 2, lupus anticoagulant syndrome, coronary artery disease with history of STEMI, hypertension, chronic respiratory failure on 4 L, chronic tracheostomy presenting with2day history of dizziness, worsening generalized weakness, and near syncope. Notably, the patient was recently admitted to the hospital from 07/21/2020 to 07/29/2020 when she was treated for acute on chronic respiratory failure secondary to decompensated CHF. The patient was discharged home with Bumex 6 mg in the morning and 4 mg in the evening. It appeared that her discharge weight was 93.3 kg (205.7 lbs). The patient endorsed compliance with her Bumex at home. However she continued to have poor oral intake. She stated that she has had episodes of dizziness and generalized weakness requiring increasing help from her family. She was try to get up off the commode and was so weak she needed help. Her family was not able to help her so EMS was activated. Since discharge from the hospital, the patient denies any worsening shortness of breath. She denied any fevers, chills, chest pain, hemoptysis, nausea, vomiting, diarrhea, abdominal pain, headache, visual disturbance, focal extremity weakness. She denies any dysuria, hematuria, hematochezia, melena. She states that she has had episodes of near syncope where she describes the her visual field becomes black for short period of time. This can usually happen even when she is at rest watching television. EMS was activated, and the patient was taken to [name].BMP showed a sodium 133, potassium 4.1, chloride 87, CO2 41, BUN 86, creatinine 1.76. WBC 19.6, hemoglobin 16.3, platelets 262,000. Troponin 73>>77. LFTs were unremarkable.  Magnesium was 2.9. Chest x-ray showed bilateral patchy infiltrates. The patient requested transfer to [name] for further evaluation and treatment.  Patient was further treated for COPD exacerbation with IV Theresa [name] and breathing treatments.  She also had hematochezia and GI was consulted.  New events last 24 hours / Subjective: Feeling well overall.  She does admit to some dysuria.  Denies any worsening shortness of breath.  Assessment & Plan:   Active Problems:   Tobacco abuse   Coronary artery disease involving coronary bypass graft of native heart without angina pectoris   Lupus anticoagulant syndrome ([place])   Uncontrolled type 2 diabetes mellitus with hyperglycemia, with long-term current use of insulin (HCC)   Systolic and diastolic CHF, chronic (HCC)   Acute renal failure superimposed on stage 3b chronic kidney disease (HCC)   Acute renal failure superimposed on stage 3a chronic kidney disease (HCC)   Rectal bleeding   Constipation   Abdominal tenderness without rebound tenderness   Acute on chronic hypoxemic respiratory failure -On 4 L O2 at baseline -Currently on 5 L  COPD exacerbation -Continues to have expiratory wheezes.  Continue Theresa [name], Theresa [name], Theresa [name], Theresa [name].  Transition to prednisone tomorrow  Hematochezia -Suspected proctitis from abundant stool in rectal vault -Appreciate GI -Continue Anusol suppository, MiraLAX.  Recommend colonoscopy as outpatient  Supratherapeutic INR -Vitamin K 2.5x1 on 11/29, FFP x1 in setting of active bleeding 11/29  Generalized weakness -PT recommending SNF placement  AKI on CKD stage IIIa -Baseline creatinine 1.0-1.2 -Resolved back to baseline -Resume Bumex  Lupus anticoagulant syndrome -Resume Theresa [name]  Type 2 diabetes mellitus, uncontrolled with hyperglycemia -Hemoglobin A1c 8.2 -Continue Theresa [name], Theresa [name]  Depression/anxiety -Continue Theresa [name]  UTI -Urine culture with Enterococcus and  patient's  complaint of dysuria -Theresa [name] x5 days  Leukocytosis -Likely secondary to steroid use  A-flutter -Continue amiodarone   DVT prophylaxis: Theresa [name]   Code Status: Full code Family Communication: No family at bedside; updated husband over the phone Disposition Plan:  Status is: Inpatient  Remains inpatient appropriate because:Inpatient level of care appropriate due to severity of illness   Dispo: The patient is from: Home              Anticipated d/c is to: SNF              Anticipated d/c date is: 1 day              Patient currently is not medically stable to d/c.  Continue IV Theresa [name] today.  Suspect patient may discharge to SNF 12/2 if improved.    Antimicrobials:  Anti-infectives (From admission, onward)   Start     Dose/Rate Route Frequency Ordered Stop   08/06/20 1000  nitrofurantoin (macrocrystal-monohydrate) (Theresa [name]) capsule 100 mg        100 mg Oral Every 12 hours 08/06/20 0914 08/11/20 0959        Objective: Vitals:   08/06/20 0217 08/06/20 0521 08/06/20 0750 08/06/20 1213  BP:  116/70    Pulse:  76  74  Resp:  18  16  Temp:  97.6 F (36.4 C)    TempSrc:  Oral    SpO2: 94% 96% 97% 96%  Weight:      Height:        Intake/Output Summary (Last 24 hours) at 08/06/2020 1256 Last data filed at 08/06/2020 1100 Gross per 24 hour  Intake --  Output 1000 ml  Net -1000 ml   Filed Weights   08/01/20 1100 08/04/20 0600  Weight: 93.3 kg 93.4 kg    Examination:  General exam: Appears calm and comfortable  Respiratory system: Bilateral expiratory wheezes.  On trach collar 5 L oxygen Cardiovascular system: S1 & S2 heard, RRR. No murmurs. No pedal edema. Gastrointestinal system: Abdomen is nondistended, soft and nontender. Normal bowel sounds heard. Central nervous system: Alert and oriented. No focal neurological deficits. Speech clear.  Extremities: Symmetric in appearance  Skin: No rashes, lesions or ulcers on exposed skin  Psychiatry: Judgement and  insight appear normal. Mood & affect appropriate.   Data Reviewed: I have personally reviewed following labs and imaging studies  CBC: Recent Labs  Lab 08/02/20 0427 08/03/20 0446 08/04/20 0534 08/05/20 0431 08/06/20 0550  WBC 15.8* 16.3* 19.2* 16.9* 15.6*  HGB 15.7* 14.1 13.8 12.8 12.4  HCT 48.4* 44.2 42.5 39.9 38.0  MCV 97.4 98.0 97.3 98.5 98.2  PLT 232 241 269 252 709   Basic Metabolic Panel: Recent Labs  Lab 08/02/20 0427 08/03/20 0446 08/04/20 0534 08/05/20 0431 08/06/20 0550  NA 134* 132* 130* 136 133*  K 4.0 3.6 3.3* 3.8 3.5  CL 80* 79* 78* 84* 85*  CO2 38* 37* 36* 39* 38*  GLUCOSE 172* 359* 412* 244* 135*  BUN 85* 83* 79* 76* 65*  CREATININE 1.53* 1.49* 1.41* 1.28* 1.24*  CALCIUM 8.5* 8.4* 8.7* 8.9 8.6*  MG  --   --  2.6* 2.7*  --    GFR: Estimated Creatinine Clearance: 50.7 mL/min (A) (by C-G formula based on SCr of 1.24 mg/dL (H)). Liver Function Tests: Recent Labs  Lab 08/01/20 1251 08/03/20 0446  AST 22 20  ALT 27 25  ALKPHOS 91 79  BILITOT 1.6* 1.9*  PROT 7.1 6.6  ALBUMIN  3.6 3.1*   No results for input(s): LIPASE, AMYLASE in the last 168 hours. No results for input(s): AMMONIA in the last 168 hours. Coagulation Profile: Recent Labs  Lab 08/02/20 0427 08/03/20 0446 08/04/20 0534 08/05/20 0431 08/06/20 0550  INR 2.7* 3.7* 4.7* 2.2* 1.4*   Cardiac Enzymes: No results for input(s): CKTOTAL, CKMB, CKMBINDEX, TROPONINI in the last 168 hours. BNP (last 3 results) No results for input(s): PROBNP in the last 8760 hours. HbA1C: No results for input(s): HGBA1C in the last 72 hours. CBG: Recent Labs  Lab 08/05/20 2034 08/05/20 2149 08/05/20 2358 08/06/20 0742 08/06/20 1138  GLUCAP 52* 80 104* 159* 230*   Lipid Profile: No results for input(s): CHOL, HDL, LDLCALC, TRIG, CHOLHDL, LDLDIRECT in the last 72 hours. Thyroid Function Tests: No results for input(s): TSH, T4TOTAL, FREET4, T3FREE, THYROIDAB in the last 72 hours. Anemia  Panel: No results for input(s): VITAMINB12, FOLATE, FERRITIN, TIBC, IRON, RETICCTPCT in the last 72 hours. Sepsis Labs: Recent Labs  Lab 08/01/20 1251 08/02/20 0427 08/03/20 0446  PROCALCITON <0.10 <0.10 <0.10    Recent Results (from the past 240 hour(s))  Culture, Urine     Status: Abnormal   Collection Time: 08/01/20  3:39 PM   Specimen: Urine, Clean Catch  Result Value Ref Range Status   Specimen Description   Final    URINE, CLEAN CATCH Performed at [facility], [address], [place], [place] [address]    Special Requests   Final    NONE Performed at [name], [address] [place], [place] [address]    Culture >=100,000 COLONIES/mL ENTEROCOCCUS FAECALIS (A)  Final   Report Status 08/04/2020 FINAL  Final   Organism ID, Bacteria ENTEROCOCCUS FAECALIS (A)  Final      Susceptibility   Enterococcus faecalis - MIC*    AMPICILLIN <=2 SENSITIVE Sensitive     NITROFURANTOIN <=16 SENSITIVE Sensitive     VANCOMYCIN 1 SENSITIVE Sensitive     * >=100,000 COLONIES/mL ENTEROCOCCUS FAECALIS  MRSA PCR Screening     Status: None   Collection Time: 08/01/20  6:05 PM   Specimen: Nasopharyngeal  Result Value Ref Range Status   MRSA by PCR NEGATIVE NEGATIVE Final    Comment:        The GeneXpert MRSA Assay (FDA approved for NASAL specimens only), is one component of a comprehensive MRSA colonization surveillance program. It is not intended to diagnose MRSA infection nor to guide or monitor treatment for MRSA infections. Performed at [facility], [address], [place],  [address]   Resp Panel by RT-PCR (Flu A&B, Covid) Nasopharyngeal Swab     Status: None   Collection Time: 08/01/20  6:05 PM   Specimen: Nasopharyngeal Swab; Nasopharyngeal(NP) swabs in vial transport medium  Result Value Ref Range Status   SARS Coronavirus 2 by RT PCR NEGATIVE NEGATIVE Final    Comment: (NOTE) SARS-CoV-2 target nucleic acids are NOT DETECTED.  The SARS-CoV-2 RNA is generally  detectable in upper respiratory specimens during the acute phase of infection. The lowest concentration of SARS-CoV-2 viral copies this assay can detect is 138 copies/mL. A negative result does not preclude SARS-Cov-2 infection and should not be used as the sole basis for treatment or other patient management decisions. A negative result may occur with  improper specimen collection/handling, submission of specimen other than nasopharyngeal swab, presence of viral mutation(s) within the areas targeted by this assay, and inadequate number of viral copies(<138 copies/mL). A negative result must be combined with  clinical observations, patient history, and epidemiological information. The expected result is Negative.  Fact Sheet for Patients:  EntrepreneurPulse.com.au  Fact Sheet for Healthcare Providers:  IncredibleEmployment.be  This test is no t yet approved or cleared by the [place] FDA and  has been authorized for detection and/or diagnosis of SARS-CoV-2 by FDA under an Emergency Use Authorization (EUA). This EUA will remain  in effect (meaning this test can be used) for the duration of the COVID-19 declaration under Section 564(b)(1) of the Act, 21 U.S.C.section 360bbb-3(b)(1), unless the authorization is terminated  or revoked sooner.       Influenza A by PCR NEGATIVE NEGATIVE Final   Influenza B by PCR NEGATIVE NEGATIVE Final    Comment: (NOTE) The Xpert Xpress SARS-CoV-2/FLU/RSV plus assay is intended as an aid in the diagnosis of influenza from Nasopharyngeal swab specimens and should not be used as a sole basis for treatment. Nasal washings and aspirates are unacceptable for Xpert Xpress SARS-CoV-2/FLU/RSV testing.  Fact Sheet for Patients: EntrepreneurPulse.com.au  Fact Sheet for Healthcare Providers: IncredibleEmployment.be  This test is not yet approved or cleared by the [place] FDA  and has been authorized for detection and/or diagnosis of SARS-CoV-2 by FDA under an Emergency Use Authorization (EUA). This EUA will remain in effect (meaning this test can be used) for the duration of the COVID-19 declaration under Section 564(b)(1) of the Act, 21 U.S.C. section 360bbb-3(b)(1), unless the authorization is terminated or revoked.  Performed at [facility], [address], [place], [place] [address]       Radiology Studies: No results found.    Scheduled Meds: . amiodarone  200 mg Oral Daily  . arformoterol  15 mcg Nebulization BID  . budesonide (Theresa [name]) nebulizer solution  0.5 mg Nebulization BID  . bumetanide  6 mg Oral Daily  . Chlorhexidine Gluconate Cloth  6 each Topical Daily  . hydrocortisone  25 mg Rectal BID  . insulin aspart  0-15 Units Subcutaneous TID WC  . insulin aspart  0-5 Units Subcutaneous QHS  . insulin aspart  5 Units Subcutaneous TID WC  . insulin glargine  20 Units Subcutaneous Daily  . ipratropium-albuterol  3 mL Nebulization Q6H  . nitrofurantoin (macrocrystal-monohydrate)  100 mg Oral Q12H  . pantoprazole  40 mg Oral Daily  . polyethylene glycol  17 g Oral BID  . [START ON 08/07/2020] predniSONE  60 mg Oral Q breakfast  . sertraline  50 mg Oral QHS  . tamsulosin  0.4 mg Oral Daily  . Warfarin - Pharmacist Dosing Inpatient   Does not apply q1600   Continuous Infusions:   LOS: 5 days      Time spent: 40 minutes   Dessa Phi, DO [facility] 08/06/2020, 12:56 PM   Available via Epic secure chat 7am-7pm After these hours, please refer to coverage provider listed on [email or web address]

## 2020-08-06 NOTE — Progress Notes (Signed)
    Subjective: No abdominal pain this morning. Abdominal pain improved. BM yesterday. Rectal pain improved. No rectal bleeding. Feels better from GI standpoint.   Objective: Vital signs in last 24 hours: Temp:  [97.6 F (36.4 C)-98.1 F (36.7 C)] 97.6 F (36.4 C) (12/01 0521) Pulse Rate:  [64-97] 76 (12/01 0521) Resp:  [16-19] 18 (12/01 0521) BP: (99-149)/(63-91) 116/70 (12/01 0521) SpO2:  [94 %-100 %] 97 % (12/01 0750) FiO2 (%):  [28 %] 28 % (12/01 0750) Last BM Date: 08/04/20 General:   Alert and oriented, pleasant Head:  Normocephalic and atraumatic. Eyes:  No icterus, sclera clear. Conjuctiva pink.  Abdomen:  Bowel sounds present, soft, non-tender, non-distended. No HSM or hernias noted. No rebound or guarding. No masses appreciated  Extremities:  Without edema. Neurologic:  Alert and  oriented x4  Intake/Output from previous day: No intake/output data recorded. Intake/Output this shift: No intake/output data recorded.  Lab Results: Recent Labs    08/04/20 0534 08/05/20 0431 08/06/20 0550  WBC 19.2* 16.9* 15.6*  HGB 13.8 12.8 12.4  HCT 42.5 39.9 38.0  PLT 269 252 264   BMET Recent Labs    08/04/20 0534 08/05/20 0431 08/06/20 0550  NA 130* 136 133*  K 3.3* 3.8 3.5  CL 78* 84* 85*  CO2 36* 39* 38*  GLUCOSE 412* 244* 135*  BUN 79* 76* 65*  CREATININE 1.41* 1.28* 1.24*  CALCIUM 8.7* 8.9 8.6*   Lab Results  Component Value Date   ALT 25 08/03/2020   AST 20 08/03/2020   ALKPHOS 79 08/03/2020   BILITOT 1.9 (H) 08/03/2020   PT/INR Recent Labs    08/05/20 0431 08/06/20 0550  LABPROT 23.4* 16.6*  INR 2.2* 1.4*    Assessment: 54 year old female withmultiple medical issues (CHF, DVTs on Coumadin, COPD, diabetes, lupus anticoagulant syndrome, CAD, history of STEMI and CABG, chronic respiratory failure, chronic trachsince 2002), admitted with acute on chronic respiratory failure with hypoxia in setting of COPD exacerbation, found to have rectal  bleeding in setting of chronic constipation, fecal impaction, supratherapeutic INR.   Rectal discomfort present but much improved. Query occult fissure but is responding well to conservative measures with Anusol suppository; we have held off on Nitro ointment but could use if worsening. As she is improving, continue with Anusol suppositories. No rectal bleeding today. Miralax increased to BID yesterday, although she declined evening dose and requested for this morning. Recommend Miralax BID and low threshold for Linzess or similar agent to reduce pill/med burden, although she seems to be responding well to Miralax at this point.   Abdominal pain improved. Long-standing abdominal pain noted but physical exam benign. Appears to be at baseline.    Plan: Miralax BID, consider changing to Linzess if not ideally managed.  Anusol suppositories BID Colonoscopy as outpatient     Annitta Needs, PhD, ANP-BC [facility] Gastroenterology    LOS: 5 days    08/06/2020, 9:22 AM

## 2020-08-06 NOTE — TOC Progression Note (Signed)
Transition of Care [facility]) - Progression Note    Patient Details  Name: Theresa [name] MRN: [id number] Date of Birth: [birth year]  [facility]) CM/SW Contact  Salome Arnt, [place] Phone Number: 08/06/2020, 9:40 AM  Clinical Narrative:  LCSW updated Debbie at [facility] that pt may possibly be ready for d/c tomorrow. She is aware pt has not received COVID vaccines. LCSW notified SNF of 30 day pasarr. LCSW discussed policy regarding quarantine at SNF with pt/husband. Per Jackelyn Poling, pt will be in quarantine for 14 days. However, pt's husband can visit wearing PPE. Pt will not need repeat COVID test. TOC will continue to follow.       Expected Discharge Plan: [place] Barriers to Discharge: Continued Medical Work up  Expected Discharge Plan and Services Expected Discharge Plan: [place] In-house Referral: Clinical Social Work Discharge Planning Services: NA Post Acute Care Choice: [place] Living arrangements for the past 2 months: Single Family Home                 DME Arranged: N/A DME Agency: NA       HH Arranged: NA HH Agency: NA         Social Determinants of Health (SDOH) Interventions    Readmission Risk Interventions Readmission Risk Prevention Plan 07/22/2020 05/17/2019  Transportation Screening Complete Complete  PCP or Specialist Appt within 3-5 Days - Complete  HRI or [place] - Complete  Social Work Consult for [place] Planning/Counseling - Complete  Palliative Care Screening - Not Applicable  Medication Review Press photographer) Complete Complete  Palliative Care Screening Not Complete -  [place] Not Applicable -  Some recent data might be hidden

## 2020-08-07 ENCOUNTER — Telehealth: Payer: Self-pay | Admitting: Gastroenterology

## 2020-08-07 ENCOUNTER — Encounter: Payer: Self-pay | Admitting: Internal Medicine

## 2020-08-07 DIAGNOSIS — K921 Melena: Secondary | ICD-10-CM | POA: Diagnosis not present

## 2020-08-07 DIAGNOSIS — I4892 Unspecified atrial flutter: Secondary | ICD-10-CM | POA: Diagnosis present

## 2020-08-07 DIAGNOSIS — J441 Chronic obstructive pulmonary disease with (acute) exacerbation: Secondary | ICD-10-CM | POA: Diagnosis present

## 2020-08-07 DIAGNOSIS — N1831 Chronic kidney disease, stage 3a: Secondary | ICD-10-CM | POA: Diagnosis not present

## 2020-08-07 DIAGNOSIS — R279 Unspecified lack of coordination: Secondary | ICD-10-CM | POA: Diagnosis not present

## 2020-08-07 DIAGNOSIS — N390 Urinary tract infection, site not specified: Secondary | ICD-10-CM | POA: Diagnosis present

## 2020-08-07 DIAGNOSIS — T68XXXA Hypothermia, initial encounter: Secondary | ICD-10-CM | POA: Diagnosis not present

## 2020-08-07 DIAGNOSIS — R531 Weakness: Secondary | ICD-10-CM | POA: Diagnosis not present

## 2020-08-07 DIAGNOSIS — Z7401 Bed confinement status: Secondary | ICD-10-CM | POA: Diagnosis not present

## 2020-08-07 DIAGNOSIS — F32A Depression, unspecified: Secondary | ICD-10-CM | POA: Diagnosis not present

## 2020-08-07 DIAGNOSIS — Z720 Tobacco use: Secondary | ICD-10-CM | POA: Diagnosis not present

## 2020-08-07 DIAGNOSIS — J9621 Acute and chronic respiratory failure with hypoxia: Secondary | ICD-10-CM | POA: Diagnosis present

## 2020-08-07 DIAGNOSIS — E119 Type 2 diabetes mellitus without complications: Secondary | ICD-10-CM | POA: Diagnosis present

## 2020-08-07 DIAGNOSIS — M6389 Disorders of muscle in diseases classified elsewhere, multiple sites: Secondary | ICD-10-CM | POA: Diagnosis present

## 2020-08-07 DIAGNOSIS — R262 Difficulty in walking, not elsewhere classified: Secondary | ICD-10-CM | POA: Diagnosis present

## 2020-08-07 DIAGNOSIS — R2681 Unsteadiness on feet: Secondary | ICD-10-CM | POA: Diagnosis present

## 2020-08-07 DIAGNOSIS — D6862 Lupus anticoagulant syndrome: Secondary | ICD-10-CM | POA: Diagnosis present

## 2020-08-07 LAB — CBC
HCT: 47.7 % — ABNORMAL HIGH (ref 36.0–46.0)
Hemoglobin: 15.2 g/dL — ABNORMAL HIGH (ref 12.0–15.0)
MCH: 31.1 pg (ref 26.0–34.0)
MCHC: 31.9 g/dL (ref 30.0–36.0)
MCV: 97.5 fL (ref 80.0–100.0)
Platelets: 285 10*3/uL (ref 150–400)
RBC: 4.89 MIL/uL (ref 3.87–5.11)
RDW: 14.7 % (ref 11.5–15.5)
WBC: 14.3 10*3/uL — ABNORMAL HIGH (ref 4.0–10.5)
nRBC: 0 % (ref 0.0–0.2)

## 2020-08-07 LAB — BASIC METABOLIC PANEL
Anion gap: 13 (ref 5–15)
BUN: 58 mg/dL — ABNORMAL HIGH (ref 6–20)
CO2: 40 mmol/L — ABNORMAL HIGH (ref 22–32)
Calcium: 8.8 mg/dL — ABNORMAL LOW (ref 8.9–10.3)
Chloride: 83 mmol/L — ABNORMAL LOW (ref 98–111)
Creatinine, Ser: 1.2 mg/dL — ABNORMAL HIGH (ref 0.44–1.00)
GFR, Estimated: 54 mL/min — ABNORMAL LOW (ref >60)
Glucose, Bld: 113 mg/dL — ABNORMAL HIGH (ref 70–99)
Potassium: 3 mmol/L — ABNORMAL LOW (ref 3.5–5.1)
Sodium: 136 mmol/L (ref 135–145)

## 2020-08-07 LAB — GLUCOSE, CAPILLARY
Glucose-Capillary: 87 mg/dL (ref 70–99)
Glucose-Capillary: 133 mg/dL — ABNORMAL HIGH (ref 70–99)

## 2020-08-07 LAB — PROTIME-INR
INR: 1.5 — ABNORMAL HIGH (ref 0.8–1.2)
Prothrombin Time: 17.2 seconds — ABNORMAL HIGH (ref 11.4–15.2)

## 2020-08-07 MED ORDER — PREDNISONE 10 MG PO TABS: ORAL_TABLET | ORAL | 0 refills | Status: DC

## 2020-08-07 MED ORDER — POTASSIUM CHLORIDE CRYS ER 20 MEQ PO TBCR
40.0000 meq | EXTENDED_RELEASE_TABLET | ORAL | Status: DC
Start: 2020-08-07 — End: 2020-08-07

## 2020-08-07 MED ORDER — WARFARIN SODIUM 2.5 MG PO TABS
1.1250 mg | ORAL_TABLET | ORAL | Status: AC
Start: 2020-08-07 — End: ?

## 2020-08-07 MED ORDER — TRAMADOL HCL 50 MG PO TABS: 50.0000 mg | ORAL_TABLET | Freq: Three times a day (TID) | ORAL | 0 refills | Status: AC | PRN

## 2020-08-07 MED ORDER — FLUTICASONE FUROATE-VILANTEROL 100-25 MCG/INH IN AEPB: 1.0000 | INHALATION_SPRAY | Freq: Every day | RESPIRATORY_TRACT | 1 refills | Status: DC

## 2020-08-07 MED ORDER — POTASSIUM CHLORIDE CRYS ER 20 MEQ PO TBCR
40.0000 meq | EXTENDED_RELEASE_TABLET | Freq: Once | ORAL | Status: AC
  Administered 2020-08-07: 40 meq via ORAL
  Filled 2020-08-07: qty 2

## 2020-08-07 MED ORDER — NITROFURANTOIN MONOHYD MACRO 100 MG PO CAPS: 100.0000 mg | ORAL_CAPSULE | Freq: Two times a day (BID) | ORAL | 0 refills | Status: AC

## 2020-08-07 MED ORDER — HYDROCORTISONE ACETATE 25 MG RE SUPP
25.0000 mg | Freq: Two times a day (BID) | RECTAL | 0 refills | Status: DC
Start: 2020-08-07 — End: 2020-11-11

## 2020-08-07 NOTE — TOC Transition Note (Addendum)
Transition of Care [facility]) - CM/SW Discharge Note  Patient Details  Name: Theresa [name] MRN: [id number] Date of Birth: [birth year]  Transition of Care [facility]) CM/SW Contact:  Sherie Don, LCSW Phone Number: 08/07/2020, 10:50 AM  Clinical Narrative: Patient received her PASRR 08/04/20: [id number] E. Patient ready for discharge. CSW spoke with Jackelyn Poling at [facility] and patient will go to room B-21 bed 1. The number to call for report is [phone]. Discharge summary, discharge orders, and therapy notes faxed to [facility]. Patient will be sent with extra trach collar and tubing. EMS scheduled; medical necessity form completed and provided to RN. Husband updated regarding transfer. TOC signing off.   Final next level of care: Skilled Nursing Facility Barriers to Discharge: Barriers Resolved  Patient Goals and CMS Choice Patient states their goals for this hospitalization and ongoing recovery are:: Go to rehab to get better CMS Medicare.gov Compare Post Acute Care list provided to:: Patient Choice offered to / list presented to : Patient  Discharge Placement PASRR number recieved: 08/04/20         Patient [facility] bed at: [facility] Patient to be transferred to facility by: [place] Name of family member notified: [name] (husband) Patient and family notified of of transfer: 08/07/20  Discharge Plan and Services In-house Referral: Clinical Social Work Discharge Planning Services: NA Post Acute Care Choice: [place]          DME Arranged: N/A DME Agency: NA HH Arranged: NA [place] Agency: NA  Readmission Risk Interventions Readmission Risk Prevention Plan 07/22/2020 05/17/2019  Transportation Screening Complete Complete  PCP or Specialist Appt within 3-5 Days - Complete  HRI or [place] - Complete  Social Work Consult for [place] Planning/Counseling - Complete  Palliative Care Screening - Not Applicable  Medication Review Press photographer) Complete  Complete  Palliative Care Screening Not Complete -  [place] Not Applicable -  Some recent data might be hidden

## 2020-08-07 NOTE — Telephone Encounter (Signed)
Patient needs nonurgent ov with Roseanne Kaufman or Aliene Altes for hospital follow up.

## 2020-08-07 NOTE — Care Management Important Message (Signed)
Important Message  Patient Details  Name: Theresa [name] MRN: [id number] Date of Birth: 1966/04/08   Medicare Important Message Given:  Yes     [name] 08/07/2020, 12:30 PM

## 2020-08-07 NOTE — Plan of Care (Signed)

## 2020-08-07 NOTE — Discharge Summary (Addendum)
Physician Discharge Summary  IO DIEUJUSTE OJJ:[id number] DOB: 05/25/1966 DOA: 08/01/2020  PCP: Monico Blitz, MD  Admit date: 08/01/2020 Discharge date: 08/07/2020  Admitted From: Home Disposition:  SNF   Recommendations for Outpatient Follow-up:  1. Follow up with PCP in 1 week 2. Follow up with GI as outpatient for colonoscopy 3. Continue monitoring INR while on Coumadin. Dose of coumadin has been decreased by 50%: 2.5mg  MWF and 1.25mg  every other day.   Discharge Condition: Stable CODE STATUS: Full  Diet recommendation:  Diet Orders (From admission, onward)    Start     Ordered   08/01/20 1202  Diet Carb Modified Fluid consistency: Thin; Room service appropriate? Yes  Diet effective now       Question Answer Comment  Diet-HS Snack? Nothing   Calorie Level Medium 1600-2000   Fluid consistency: Thin   Room service appropriate? Yes      08/01/20 1201          Brief/Interim Summary: Theresa [name] is a 54 year old female with chronic systolic and diastolic CHF, COPD, continued tobacco abuse, diabetes mellitus type 2, lupus anticoagulant syndrome, coronary artery disease with history of STEMI, hypertension, chronic respiratory failure on 4 L, chronic tracheostomy presenting with2day history of dizziness, worsening generalized weakness, and near syncope. Notably, the patient was recently admitted to the hospital from 07/21/2020 to 07/29/2020 when she was treated for acute on chronic respiratory failure secondary to decompensated CHF. The patient was discharged home with Bumex 6 mg in the morning and 4 mg in the evening. It appeared that her discharge weight was 93.3 kg (205.7 lbs). The patient endorsed compliance with her Bumex at home. However she continued to have poor oral intake. She stated that she has had episodes of dizziness and generalized weakness requiring increasing help from her family. She was try to get up off the commode and was so weak she needed help. Her  family was not able to help her so EMS was activated. Since discharge from the hospital, the patient denies any worsening shortness of breath. She denied any fevers, chills, chest pain, hemoptysis, nausea, vomiting, diarrhea, abdominal pain, headache, visual disturbance, focal extremity weakness. She denies any dysuria, hematuria, hematochezia, melena. She states that she has had episodes of near syncope where she describes the her visual field becomes black for short period of time. This can usually happen even when she is at rest watching television. EMS was activated, and the patient was taken to [facility].BMP showed a sodium 133, potassium 4.1, chloride 87, CO2 41, BUN 86, creatinine 1.76. WBC 19.6, hemoglobin 16.3, platelets 262,000. Troponin 73>>77. LFTs were unremarkable. Magnesium was 2.9. Chest x-ray showed bilateral patchy infiltrates. The patient requested transfer to [facility] for further evaluation and treatment.  Patient was further treated for COPD exacerbation with IV Solu-Medrol and breathing treatments.  She also had hematochezia and GI was consulted. Hematochezia thought to be due to proctitis from abundant stool in rectal vault and recommended for outpatient colonoscopy. She also received vit K and FFP in setting of bleeding and warfarin use. Her Hgb remained stable and COPD exacerbation improved. She was transitioned to PO prednisone for discharge.   Discharge Diagnoses:  Active Problems:   Tobacco abuse   Coronary artery disease involving coronary bypass graft of native heart without angina pectoris   Lupus anticoagulant syndrome ([place])   Uncontrolled type 2 diabetes mellitus with hyperglycemia, with long-term current use of insulin (HCC)   Systolic and diastolic CHF, chronic ([place])  Acute renal failure superimposed on stage 3b chronic kidney disease (HCC)   Acute renal failure superimposed on stage 3a chronic kidney disease (HCC)   Rectal bleeding    Constipation   Abdominal tenderness without rebound tenderness   Acute on chronic hypoxemic respiratory failure -On 4 L O2 at baseline -Currently on 5 L, wean as able   COPD exacerbation -Improved. Tx with Solu-Medrol, DuoNeb, Pulmicort, Brovana.  Transition to prednisone for taper on discharge  Hematochezia -Suspected proctitis from abundant stool in rectal vault -Appreciate GI -Continue Anusol suppository, MiraLAX.  Recommend colonoscopy as outpatient  Supratherapeutic INR -Vitamin K 2.5x1 on 11/29, FFP x1 in setting of active bleeding 11/29  Generalized weakness -PT recommending SNF placement  AKI on CKD stage IIIa -Baseline creatinine 1.0-1.2 -Resolved back to baseline -Continue Bumex with K replacement   Lupus anticoagulant syndrome -Continue Coumadin  Type 2 diabetes mellitus, uncontrolled with hyperglycemia -Hemoglobin A1c 8.2 -Continue Lantus, NovoLog  Depression/anxiety -Continue Zoloft  UTI -Urine culture with Enterococcus and patient's complaint of dysuria -Macrobid x5 days  Leukocytosis -Likely secondary to steroid use  A-flutter -Continue amiodarone   Discharge Instructions  Discharge Instructions    Increase activity slowly   Complete by: As directed      Allergies as of 08/07/2020      Reactions   Penicillins Rash   Has patient had a PCN reaction causing immediate rash, facial/tongue/throat swelling, SOB or lightheadedness with hypotension: Yes Has patient had a PCN reaction causing severe rash involving mucus membranes or skin necrosis: No Has patient had a PCN reaction that required hospitalization No Has patient had a PCN reaction occurring within the last 10 years: No If all of the above answers are "NO", then may proceed with Cephalosporin use. REACTION: rash Pt has tolerated cefepime in the past.   Ciprofloxacin    nausea   Ibuprofen Rash   swelling in leg      Medication List    STOP taking these medications    LORazepam 0.5 MG tablet Commonly known as: ATIVAN     TAKE these medications   albuterol 108 (90 Base) MCG/ACT inhaler Commonly known as: VENTOLIN HFA Inhale 2 puffs into the lungs every 4 (four) hours as needed for wheezing or shortness of breath.   albuterol (2.5 MG/3ML) 0.083% nebulizer solution Commonly known as: PROVENTIL Take 3 mLs (2.5 mg total) by nebulization every 4 (four) hours as needed for wheezing.   amiodarone 200 MG tablet Commonly known as: PACERONE Take 1 tablet (200 mg total) by mouth daily.   atorvastatin 20 MG tablet Commonly known as: LIPITOR Take 20 mg by mouth daily.   bumetanide 2 MG tablet Commonly known as: BUMEX Take 1 tablet (2 mg total) by mouth daily. Takes 6 mg am and 4 mg pm   fluticasone furoate-vilanterol 100-25 MCG/INH Aepb Commonly known as: BREO ELLIPTA Inhale 1 puff into the lungs daily.   gabapentin 300 MG capsule Commonly known as: NEURONTIN Take 3 capsules (900 mg total) by mouth at bedtime.   hydrocortisone 25 MG suppository Commonly known as: ANUSOL-HC Place 1 suppository (25 mg total) rectally 2 (two) times daily.   mometasone 0.1 % ointment Commonly known as: ELOCON Apply 1 application topically daily as needed.   nitrofurantoin (macrocrystal-monohydrate) 100 MG capsule Commonly known as: MACROBID Take 1 capsule (100 mg total) by mouth every 12 (twelve) hours for 3 days.   nitroGLYCERIN 0.4 MG SL tablet Commonly known as: NITROSTAT Place 0.4 mg under the tongue every  5 (five) minutes as needed for chest pain.   NovoLOG FlexPen 100 UNIT/ML FlexPen Generic drug: insulin aspart Inject 1-10 Units into the skin 3 (three) times daily before meals.   NYQUIL PO Take 20 mLs by mouth daily.   Ozempic (0.25 or 0.5 MG/DOSE) 2 MG/1.5ML Sopn Generic drug: Semaglutide(0.25 or 0.5MG /DOS) Inject 0.5 mg into the skin every Wednesday.   pantoprazole 20 MG tablet Commonly known as: PROTONIX Take 20 mg by mouth daily.    polyethylene glycol 17 g packet Commonly known as: MIRALAX / GLYCOLAX Take 17 g by mouth daily as needed for mild constipation.   potassium chloride SA 20 MEQ tablet Commonly known as: KLOR-CON Take 2 tablets (40 mEq total) by mouth daily.   predniSONE 10 MG tablet Commonly known as: DELTASONE Take 4 tabs for 3 days, then 3 tabs for 3 days, then 2 tabs for 3 days, then 1 tab for 3 days, then 1/2 tab for 4 days. What changed:   medication strength  how much to take  how to take this  when to take this  additional instructions   sertraline 50 MG tablet Commonly known as: ZOLOFT Take 50 mg by mouth at bedtime.   tamsulosin 0.4 MG Caps capsule Commonly known as: FLOMAX Take 0.4 mg by mouth daily.   traMADol 50 MG tablet Commonly known as: ULTRAM Take 1 tablet (50 mg total) by mouth 3 (three) times daily as needed for moderate pain.   Tyler Aas FlexTouch 100 UNIT/ML FlexTouch Pen Generic drug: insulin degludec Inject 30 Units into the skin at bedtime.   warfarin 2.5 MG tablet Commonly known as: COUMADIN Take 0.5-1 tablets (1.25-2.5 mg total) by mouth See admin instructions. 2.5mg  MWF and 1.25mg  every other day. What changed:   how much to take  additional instructions       Contact information for follow-up providers    Monico Blitz, MD. Schedule an appointment as soon as possible for a visit in 1 week(s).   Specialty: Internal Medicine Contact information: [place] [address] [phone]        Eloise Harman, DO Follow up.   Specialty: Internal Medicine Why: GI follow up  Contact information: [place] [address] [phone]            Contact information for after-discharge care    [facility] .   Service: Skilled Nursing Contact information: [place] [phone]                 Allergies  Allergen Reactions  .  Penicillins Rash    Has patient had a PCN reaction causing immediate rash, facial/tongue/throat swelling, SOB or lightheadedness with hypotension: Yes Has patient had a PCN reaction causing severe rash involving mucus membranes or skin necrosis: No Has patient had a PCN reaction that required hospitalization No Has patient had a PCN reaction occurring within the last 10 years: No If all of the above answers are "NO", then may proceed with Cephalosporin use.   REACTION: rash Pt has tolerated cefepime in the past.  . Ciprofloxacin     nausea  . Ibuprofen Rash    swelling in leg     Consultations:  GI   Procedures/Studies: CT CHEST WO CONTRAST  Result Date: 08/01/2020 CLINICAL DATA:  Cough.  Shortness of breath.  Pneumonia. EXAM: CT CHEST WITHOUT CONTRAST TECHNIQUE: Multidetector CT imaging of the chest was performed following  the standard protocol without IV contrast. COMPARISON:  Chest radiograph 08/01/2020 and chest CT from 04/18/2020 FINDINGS: Cardiovascular: Coronary, aortic arch, and branch vessel atherosclerotic vascular disease. Mild cardiomegaly. Prior CABG. Mediastinum/Nodes: Scattered mediastinal lymph nodes are present. 1.0 cm right lower paratracheal node on image 41 of series 2, previously 1.4 cm. Lungs/Pleura: Tracheostomy tube in place. Emphysema. Hazy ground-glass opacities throughout both lungs, but substantially improved from 04/18/2020, possibly from improving pulmonary edema. Bandlike densities tracking in both lower lobes and to a lesser extent in both upper lobes with associated volume loss, probably from atelectasis. Upper Abdomen: Cholelithiasis.  Bilateral adrenal adenomas. Musculoskeletal: Stable T9 compression fracture. IMPRESSION: 1. Hazy ground-glass opacities throughout both lungs, but substantially improved from 04/18/2020, possibly from improving pulmonary edema. 2. Bandlike densities tracking in both lower lobes and to a lesser extent in both upper lobes with  associated volume loss, probably from atelectasis. 3. Other imaging findings of potential clinical significance: Coronary, aortic arch, and branch vessel atherosclerotic vascular disease. Mild cardiomegaly. Prior CABG. Cholelithiasis. Bilateral adrenal adenomas. Stable T9 compression fracture. 4. Emphysema and aortic atherosclerosis. Aortic Atherosclerosis (ICD10-I70.0) and Emphysema (ICD10-J43.9). Electronically Signed   By: Van Clines M.D.   On: 08/01/2020 17:10   DG CHEST PORT 1 VIEW  Result Date: 08/02/2020 CLINICAL DATA:  PICC line placement EXAM: PORTABLE CHEST 1 VIEW COMPARISON:  November 26 21 FINDINGS: The heart size remains enlarged. There are improving airspace opacities bilaterally. Band like opacities are again noted bilaterally favored to represent areas of atelectasis. There is no pneumothorax. Postsurgical changes are noted over the right lower lung zone. There is a probable right-sided pleural effusion. The endotracheal tube terminates above the carina by approximately 4 cm. The left-sided PICC line projects over the right atrium and should be retracted by approximately 4 cm. IMPRESSION: 1. The left-sided PICC line tip projects over the right atrium and should be retracted by approximately 4 cm. 2. Remaining lines and tubes as detailed above. 3. Improving aeration throughout both lungs. These results will be called to the ordering clinician or representative by the Radiologist Assistant, and communication documented in the PACS or [employer]. Electronically Signed   By: Constance Holster M.D.   On: 08/02/2020 19:20   DG Chest Port 1 View  Result Date: 07/28/2020 CLINICAL DATA:  Shortness of breath. EXAM: PORTABLE CHEST 1 VIEW COMPARISON:  July 21, 2020. FINDINGS: Stable cardiomegaly. Tracheostomy tube is in good position. Status post coronary bypass graft. No pneumothorax is noted. Mild bibasilar subsegmental atelectasis is noted with small pleural effusions. Bony thorax  is unremarkable. IMPRESSION: Mild bibasilar subsegmental atelectasis with small pleural effusions. Electronically Signed   By: Marijo Conception M.D.   On: 07/28/2020 09:31   DG Chest Port 1 View  Result Date: 07/21/2020 CLINICAL DATA:  Shortness of breath EXAM: PORTABLE CHEST 1 VIEW COMPARISON:  07/03/2020 FINDINGS: Tracheostomy tube remains in place. Post CABG changes. Stable cardiomegaly. There is pulmonary vascular congestion with diffuse bilateral interstitial prominence. Streaky perihilar and bibasilar opacities. No large pleural fluid collection. No pneumothorax. IMPRESSION: Findings suggestive of CHF with pulmonary edema. Streaky perihilar and bibasilar opacities may represent atelectasis, alveolar edema, versus infection. Electronically Signed   By: Davina Poke D.O.   On: 07/21/2020 11:46   EEG adult  Result Date: 08/01/2020 Lora Havens, MD     08/01/2020  4:38 PM Patient Name: Theresa [name] MRN: [id number] Epilepsy Attending: Lora Havens Referring Physician/Provider: Dr Shanon Brow Tat Date: 08/01/2020 Duration: 23.43 mins Patient history:  54yo with an episode of near syncope. EEG to evaluate for seizure. Level of alertness: Awake AEDs during EEG study: None Technical aspects: This EEG study was done with scalp electrodes positioned according to the 10-20 International system of electrode placement. Electrical activity was acquired at a sampling rate of 500Hz  and reviewed with a high frequency filter of 70Hz  and a low frequency filter of 1Hz . EEG data were recorded continuously and digitally stored. Description: The posterior dominant rhythm consists of 8 Hz activity of moderate voltage (25-35 uV) seen predominantly in posterior head regions, symmetric and reactive to eye opening and eye closing. EEG showed intermittent generalized 3 to 6 Hz theta-delta slowing. Hyperventilation and photic stimulation were not performed.   ABNORMALITY -Intermittent slow, generalized IMPRESSION: This  study is suggestive of mild diffuse encephalopathy, nonspecific etiology. No seizures or epileptiform discharges were seen throughout the recording. Priyanka O Yadav   [place] EKG SITE RITE  Result Date: 08/02/2020 If Site Rite image not attached, placement could not be confirmed due to current cardiac rhythm.      Discharge Exam: Vitals:   08/07/20 0545 08/07/20 0705  BP: 114/70   Pulse: 80 68  Resp: 17 17  Temp: (!) 96.8 F (36 C)   SpO2: 96% 96%    General: Pt is alert, awake, not in acute distress Cardiovascular: RRR, S1/S2 +, no edema Respiratory: CTA bilaterally, no wheezing, no rhonchi, no respiratory distress, no conversational dyspnea, trach collar in place  Abdominal: Soft, NT, ND, bowel sounds + Extremities: no edema, no cyanosis Psych: Normal mood and affect, stable judgement and insight     The results of significant diagnostics from this hospitalization (including imaging, microbiology, ancillary and laboratory) are listed below for reference.     Microbiology: Recent Results (from the past 240 hour(s))  Culture, Urine     Status: Abnormal   Collection Time: 08/01/20  3:39 PM   Specimen: Urine, Clean Catch  Result Value Ref Range Status   Specimen Description   Final    URINE, CLEAN CATCH Performed at [facility], [address], [place], [place] [address]    Special Requests   Final    NONE Performed at [facility], [address] [place], [place] [address]    Culture >=100,000 COLONIES/mL ENTEROCOCCUS FAECALIS (A)  Final   Report Status 08/04/2020 FINAL  Final   Organism ID, Bacteria ENTEROCOCCUS FAECALIS (A)  Final      Susceptibility   Enterococcus faecalis - MIC*    AMPICILLIN <=2 SENSITIVE Sensitive     NITROFURANTOIN <=16 SENSITIVE Sensitive     VANCOMYCIN 1 SENSITIVE Sensitive     * >=100,000 COLONIES/mL ENTEROCOCCUS FAECALIS  MRSA PCR Screening     Status: None   Collection Time: 08/01/20  6:05 PM   Specimen: Nasopharyngeal  Result Value  Ref Range Status   MRSA by PCR NEGATIVE NEGATIVE Final    Comment:        The GeneXpert MRSA Assay (FDA approved for NASAL specimens only), is one component of a comprehensive MRSA colonization surveillance program. It is not intended to diagnose MRSA infection nor to guide or monitor treatment for MRSA infections. Performed at [facility], [address] [place], [place] [address]   Resp Panel by RT-PCR (Flu A&B, Covid) Nasopharyngeal Swab     Status: None   Collection Time: 08/01/20  6:05 PM   Specimen: Nasopharyngeal Swab; Nasopharyngeal(NP) swabs in vial transport medium  Result Value Ref Range Status   SARS Coronavirus  2 by RT PCR NEGATIVE NEGATIVE Final    Comment: (NOTE) SARS-CoV-2 target nucleic acids are NOT DETECTED.  The SARS-CoV-2 RNA is generally detectable in upper respiratory specimens during the acute phase of infection. The lowest concentration of SARS-CoV-2 viral copies this assay can detect is 138 copies/mL. A negative result does not preclude SARS-Cov-2 infection and should not be used as the sole basis for treatment or other patient management decisions. A negative result may occur with  improper specimen collection/handling, submission of specimen other than nasopharyngeal swab, presence of viral mutation(s) within the areas targeted by this assay, and inadequate number of viral copies(<138 copies/mL). A negative result must be combined with clinical observations, patient history, and epidemiological information. The expected result is Negative.  Fact Sheet for Patients:  EntrepreneurPulse.com.au  Fact Sheet for Healthcare Providers:  IncredibleEmployment.be  This test is no t yet approved or cleared by the [place] FDA and  has been authorized for detection and/or diagnosis of SARS-CoV-2 by FDA under an Emergency Use Authorization (EUA). This EUA will remain  in effect (meaning this test can be used) for the  duration of the COVID-19 declaration under Section 564(b)(1) of the Act, 21 U.S.C.section 360bbb-3(b)(1), unless the authorization is terminated  or revoked sooner.       Influenza A by PCR NEGATIVE NEGATIVE Final   Influenza B by PCR NEGATIVE NEGATIVE Final    Comment: (NOTE) The Xpert Xpress SARS-CoV-2/FLU/RSV plus assay is intended as an aid in the diagnosis of influenza from Nasopharyngeal swab specimens and should not be used as a sole basis for treatment. Nasal washings and aspirates are unacceptable for Xpert Xpress SARS-CoV-2/FLU/RSV testing.  Fact Sheet for Patients: EntrepreneurPulse.com.au  Fact Sheet for Healthcare Providers: IncredibleEmployment.be  This test is not yet approved or cleared by the [place] FDA and has been authorized for detection and/or diagnosis of SARS-CoV-2 by FDA under an Emergency Use Authorization (EUA). This EUA will remain in effect (meaning this test can be used) for the duration of the COVID-19 declaration under Section 564(b)(1) of the Act, 21 U.S.C. section 360bbb-3(b)(1), unless the authorization is terminated or revoked.  Performed at [facility], [address] [place], [place] [address]      Labs: BNP (last 3 results) Recent Labs    07/03/20 0409 07/21/20 1152 08/01/20 1251  BNP 480.0* 444.0* 408.1*   Basic Metabolic Panel: Recent Labs  Lab 08/03/20 0446 08/04/20 0534 08/05/20 0431 08/06/20 0550 08/07/20 0922  NA 132* 130* 136 133* 136  K 3.6 3.3* 3.8 3.5 3.0*  CL 79* 78* 84* 85* 83*  CO2 37* 36* 39* 38* 40*  GLUCOSE 359* 412* 244* 135* 113*  BUN 83* 79* 76* 65* 58*  CREATININE 1.49* 1.41* 1.28* 1.24* 1.20*  CALCIUM 8.4* 8.7* 8.9 8.6* 8.8*  MG  --  2.6* 2.7*  --   --    Liver Function Tests: Recent Labs  Lab 08/01/20 1251 08/03/20 0446  AST 22 20  ALT 27 25  ALKPHOS 91 79  BILITOT 1.6* 1.9*  PROT 7.1 6.6  ALBUMIN 3.6 3.1*   No results for input(s): LIPASE,  AMYLASE in the last 168 hours. No results for input(s): AMMONIA in the last 168 hours. CBC: Recent Labs  Lab 08/03/20 0446 08/04/20 0534 08/05/20 0431 08/06/20 0550 08/07/20 0922  WBC 16.3* 19.2* 16.9* 15.6* 14.3*  HGB 14.1 13.8 12.8 12.4 15.2*  HCT 44.2 42.5 39.9 38.0 47.7*  MCV 98.0 97.3 98.5 98.2 97.5  PLT 241 269  252 264 285   Cardiac Enzymes: No results for input(s): CKTOTAL, CKMB, CKMBINDEX, TROPONINI in the last 168 hours. BNP: Invalid input(s): POCBNP CBG: Recent Labs  Lab 08/06/20 0742 08/06/20 1138 08/06/20 1621 08/07/20 0736 08/07/20 1117  GLUCAP 159* 230* 162* 87 133*   D-Dimer No results for input(s): DDIMER in the last 72 hours. Hgb A1c No results for input(s): HGBA1C in the last 72 hours. Lipid Profile No results for input(s): CHOL, HDL, LDLCALC, TRIG, CHOLHDL, LDLDIRECT in the last 72 hours. Thyroid function studies No results for input(s): TSH, T4TOTAL, T3FREE, THYROIDAB in the last 72 hours.  Invalid input(s): FREET3 Anemia work up No results for input(s): VITAMINB12, FOLATE, FERRITIN, TIBC, IRON, RETICCTPCT in the last 72 hours. Urinalysis    Component Value Date/Time   COLORURINE YELLOW 08/01/2020 1539   APPEARANCEUR CLEAR 08/01/2020 1539   LABSPEC 1.012 08/01/2020 1539   PHURINE 6.0 08/01/2020 1539   GLUCOSEU NEGATIVE 08/01/2020 1539   HGBUR MODERATE (A) 08/01/2020 1539   BILIRUBINUR NEGATIVE 08/01/2020 1539   [place] 08/01/2020 1539   PROTEINUR 30 (A) 08/01/2020 1539   NITRITE NEGATIVE 08/01/2020 1539   LEUKOCYTESUR NEGATIVE 08/01/2020 1539   Sepsis Labs Invalid input(s): PROCALCITONIN,  WBC,  LACTICIDVEN Microbiology Recent Results (from the past 240 hour(s))  Culture, Urine     Status: Abnormal   Collection Time: 08/01/20  3:39 PM   Specimen: Urine, Clean Catch  Result Value Ref Range Status   Specimen Description   Final    URINE, CLEAN CATCH Performed at [facility], [address], [place], [place] [address]     Special Requests   Final    NONE Performed at [facility], [address] [place], [place] [address]    Culture >=100,000 COLONIES/mL ENTEROCOCCUS FAECALIS (A)  Final   Report Status 08/04/2020 FINAL  Final   Organism ID, Bacteria ENTEROCOCCUS FAECALIS (A)  Final      Susceptibility   Enterococcus faecalis - MIC*    AMPICILLIN <=2 SENSITIVE Sensitive     NITROFURANTOIN <=16 SENSITIVE Sensitive     VANCOMYCIN 1 SENSITIVE Sensitive     * >=100,000 COLONIES/mL ENTEROCOCCUS FAECALIS  MRSA PCR Screening     Status: None   Collection Time: 08/01/20  6:05 PM   Specimen: Nasopharyngeal  Result Value Ref Range Status   MRSA by PCR NEGATIVE NEGATIVE Final    Comment:        The GeneXpert MRSA Assay (FDA approved for NASAL specimens only), is one component of a comprehensive MRSA colonization surveillance program. It is not intended to diagnose MRSA infection nor to guide or monitor treatment for MRSA infections. Performed at [facility], [address], [place], [place] [address]   Resp Panel by RT-PCR (Flu A&B, Covid) Nasopharyngeal Swab     Status: None   Collection Time: 08/01/20  6:05 PM   Specimen: Nasopharyngeal Swab; Nasopharyngeal(NP) swabs in vial transport medium  Result Value Ref Range Status   SARS Coronavirus 2 by RT PCR NEGATIVE NEGATIVE Final    Comment: (NOTE) SARS-CoV-2 target nucleic acids are NOT DETECTED.  The SARS-CoV-2 RNA is generally detectable in upper respiratory specimens during the acute phase of infection. The lowest concentration of SARS-CoV-2 viral copies this assay can detect is 138 copies/mL. A negative result does not preclude SARS-Cov-2 infection and should not be used as the sole basis for treatment or other patient management decisions. A negative result may occur with  improper specimen collection/handling, submission of specimen other than  nasopharyngeal swab, presence of viral mutation(s) within the areas targeted by this assay, and  inadequate number of viral copies(<138 copies/mL). A negative result must be combined with clinical observations, patient history, and epidemiological information. The expected result is Negative.  Fact Sheet for Patients:  EntrepreneurPulse.com.au  Fact Sheet for Healthcare Providers:  IncredibleEmployment.be  This test is no t yet approved or cleared by the [place] FDA and  has been authorized for detection and/or diagnosis of SARS-CoV-2 by FDA under an Emergency Use Authorization (EUA). This EUA will remain  in effect (meaning this test can be used) for the duration of the COVID-19 declaration under Section 564(b)(1) of the Act, 21 U.S.C.section 360bbb-3(b)(1), unless the authorization is terminated  or revoked sooner.       Influenza A by PCR NEGATIVE NEGATIVE Final   Influenza B by PCR NEGATIVE NEGATIVE Final    Comment: (NOTE) The Xpert Xpress SARS-CoV-2/FLU/RSV plus assay is intended as an aid in the diagnosis of influenza from Nasopharyngeal swab specimens and should not be used as a sole basis for treatment. Nasal washings and aspirates are unacceptable for Xpert Xpress SARS-CoV-2/FLU/RSV testing.  Fact Sheet for Patients: EntrepreneurPulse.com.au  Fact Sheet for Healthcare Providers: IncredibleEmployment.be  This test is not yet approved or cleared by the [place] FDA and has been authorized for detection and/or diagnosis of SARS-CoV-2 by FDA under an Emergency Use Authorization (EUA). This EUA will remain in effect (meaning this test can be used) for the duration of the COVID-19 declaration under Section 564(b)(1) of the Act, 21 U.S.C. section 360bbb-3(b)(1), unless the authorization is terminated or revoked.  Performed at [facility], [address], [place], [place] [address]      Patient was seen and examined on the day of discharge and was found to be in stable condition.  Time coordinating discharge: 45 minutes including assessment and coordination of care, as well as examination of the patient.   SIGNED:  Dessa Phi, DO [facility] 08/07/2020, 11:48 AM

## 2020-08-11 DIAGNOSIS — R531 Weakness: Secondary | ICD-10-CM | POA: Diagnosis not present

## 2020-08-11 DIAGNOSIS — D6862 Lupus anticoagulant syndrome: Secondary | ICD-10-CM | POA: Diagnosis not present

## 2020-08-11 DIAGNOSIS — F32A Depression, unspecified: Secondary | ICD-10-CM | POA: Diagnosis not present

## 2020-08-11 DIAGNOSIS — E119 Type 2 diabetes mellitus without complications: Secondary | ICD-10-CM | POA: Diagnosis not present

## 2020-08-11 DIAGNOSIS — J441 Chronic obstructive pulmonary disease with (acute) exacerbation: Secondary | ICD-10-CM | POA: Diagnosis not present

## 2020-08-11 DIAGNOSIS — K921 Melena: Secondary | ICD-10-CM | POA: Diagnosis not present

## 2020-08-11 DIAGNOSIS — J9621 Acute and chronic respiratory failure with hypoxia: Secondary | ICD-10-CM | POA: Diagnosis not present

## 2020-08-11 DIAGNOSIS — N1831 Chronic kidney disease, stage 3a: Secondary | ICD-10-CM | POA: Diagnosis not present

## 2020-08-12 DIAGNOSIS — F17210 Nicotine dependence, cigarettes, uncomplicated: Secondary | ICD-10-CM | POA: Diagnosis not present

## 2020-08-12 DIAGNOSIS — Z86718 Personal history of other venous thrombosis and embolism: Secondary | ICD-10-CM | POA: Diagnosis not present

## 2020-08-12 DIAGNOSIS — F419 Anxiety disorder, unspecified: Secondary | ICD-10-CM | POA: Diagnosis not present

## 2020-08-12 DIAGNOSIS — K5900 Constipation, unspecified: Secondary | ICD-10-CM | POA: Diagnosis not present

## 2020-08-12 DIAGNOSIS — N1832 Chronic kidney disease, stage 3b: Secondary | ICD-10-CM | POA: Diagnosis not present

## 2020-08-12 DIAGNOSIS — I255 Ischemic cardiomyopathy: Secondary | ICD-10-CM | POA: Diagnosis not present

## 2020-08-12 DIAGNOSIS — K219 Gastro-esophageal reflux disease without esophagitis: Secondary | ICD-10-CM | POA: Diagnosis not present

## 2020-08-12 DIAGNOSIS — J449 Chronic obstructive pulmonary disease, unspecified: Secondary | ICD-10-CM | POA: Diagnosis not present

## 2020-08-12 DIAGNOSIS — I130 Hypertensive heart and chronic kidney disease with heart failure and stage 1 through stage 4 chronic kidney disease, or unspecified chronic kidney disease: Secondary | ICD-10-CM | POA: Diagnosis not present

## 2020-08-12 DIAGNOSIS — I252 Old myocardial infarction: Secondary | ICD-10-CM | POA: Diagnosis not present

## 2020-08-12 DIAGNOSIS — I480 Paroxysmal atrial fibrillation: Secondary | ICD-10-CM | POA: Diagnosis not present

## 2020-08-12 DIAGNOSIS — K625 Hemorrhage of anus and rectum: Secondary | ICD-10-CM | POA: Diagnosis not present

## 2020-08-12 DIAGNOSIS — I4892 Unspecified atrial flutter: Secondary | ICD-10-CM | POA: Diagnosis not present

## 2020-08-12 DIAGNOSIS — E1140 Type 2 diabetes mellitus with diabetic neuropathy, unspecified: Secondary | ICD-10-CM | POA: Diagnosis not present

## 2020-08-12 DIAGNOSIS — E1122 Type 2 diabetes mellitus with diabetic chronic kidney disease: Secondary | ICD-10-CM | POA: Diagnosis not present

## 2020-08-12 DIAGNOSIS — Z6835 Body mass index (BMI) 35.0-35.9, adult: Secondary | ICD-10-CM | POA: Diagnosis not present

## 2020-08-12 DIAGNOSIS — J9621 Acute and chronic respiratory failure with hypoxia: Secondary | ICD-10-CM | POA: Diagnosis not present

## 2020-08-12 DIAGNOSIS — I5023 Acute on chronic systolic (congestive) heart failure: Secondary | ICD-10-CM | POA: Diagnosis not present

## 2020-08-12 DIAGNOSIS — I2510 Atherosclerotic heart disease of native coronary artery without angina pectoris: Secondary | ICD-10-CM | POA: Diagnosis not present

## 2020-08-12 DIAGNOSIS — I959 Hypotension, unspecified: Secondary | ICD-10-CM | POA: Diagnosis not present

## 2020-08-12 DIAGNOSIS — F32A Depression, unspecified: Secondary | ICD-10-CM | POA: Diagnosis not present

## 2020-08-12 DIAGNOSIS — D6862 Lupus anticoagulant syndrome: Secondary | ICD-10-CM | POA: Diagnosis not present

## 2020-08-12 DIAGNOSIS — E1165 Type 2 diabetes mellitus with hyperglycemia: Secondary | ICD-10-CM | POA: Diagnosis not present

## 2020-08-12 DIAGNOSIS — Z930 Tracheostomy status: Secondary | ICD-10-CM | POA: Diagnosis not present

## 2020-08-13 DIAGNOSIS — N1832 Chronic kidney disease, stage 3b: Secondary | ICD-10-CM | POA: Diagnosis not present

## 2020-08-13 DIAGNOSIS — E1122 Type 2 diabetes mellitus with diabetic chronic kidney disease: Secondary | ICD-10-CM | POA: Diagnosis not present

## 2020-08-13 DIAGNOSIS — I130 Hypertensive heart and chronic kidney disease with heart failure and stage 1 through stage 4 chronic kidney disease, or unspecified chronic kidney disease: Secondary | ICD-10-CM | POA: Diagnosis not present

## 2020-08-13 DIAGNOSIS — J449 Chronic obstructive pulmonary disease, unspecified: Secondary | ICD-10-CM | POA: Diagnosis not present

## 2020-08-13 DIAGNOSIS — J9621 Acute and chronic respiratory failure with hypoxia: Secondary | ICD-10-CM | POA: Diagnosis not present

## 2020-08-13 DIAGNOSIS — I5023 Acute on chronic systolic (congestive) heart failure: Secondary | ICD-10-CM | POA: Diagnosis not present

## 2020-08-14 DIAGNOSIS — J449 Chronic obstructive pulmonary disease, unspecified: Secondary | ICD-10-CM | POA: Diagnosis not present

## 2020-08-14 DIAGNOSIS — Z6841 Body Mass Index (BMI) 40.0 and over, adult: Secondary | ICD-10-CM | POA: Diagnosis not present

## 2020-08-14 DIAGNOSIS — I82409 Acute embolism and thrombosis of unspecified deep veins of unspecified lower extremity: Secondary | ICD-10-CM | POA: Diagnosis not present

## 2020-08-14 DIAGNOSIS — Z299 Encounter for prophylactic measures, unspecified: Secondary | ICD-10-CM | POA: Diagnosis not present

## 2020-08-14 DIAGNOSIS — I10 Essential (primary) hypertension: Secondary | ICD-10-CM | POA: Diagnosis not present

## 2020-08-14 DIAGNOSIS — I5042 Chronic combined systolic (congestive) and diastolic (congestive) heart failure: Secondary | ICD-10-CM | POA: Diagnosis not present

## 2020-08-15 DIAGNOSIS — N1832 Chronic kidney disease, stage 3b: Secondary | ICD-10-CM | POA: Diagnosis not present

## 2020-08-15 DIAGNOSIS — E1122 Type 2 diabetes mellitus with diabetic chronic kidney disease: Secondary | ICD-10-CM | POA: Diagnosis not present

## 2020-08-15 DIAGNOSIS — I5023 Acute on chronic systolic (congestive) heart failure: Secondary | ICD-10-CM | POA: Diagnosis not present

## 2020-08-15 DIAGNOSIS — J9621 Acute and chronic respiratory failure with hypoxia: Secondary | ICD-10-CM | POA: Diagnosis not present

## 2020-08-15 DIAGNOSIS — I130 Hypertensive heart and chronic kidney disease with heart failure and stage 1 through stage 4 chronic kidney disease, or unspecified chronic kidney disease: Secondary | ICD-10-CM | POA: Diagnosis not present

## 2020-08-15 DIAGNOSIS — J449 Chronic obstructive pulmonary disease, unspecified: Secondary | ICD-10-CM | POA: Diagnosis not present

## 2020-08-16 DIAGNOSIS — I130 Hypertensive heart and chronic kidney disease with heart failure and stage 1 through stage 4 chronic kidney disease, or unspecified chronic kidney disease: Secondary | ICD-10-CM | POA: Diagnosis not present

## 2020-08-16 DIAGNOSIS — J9621 Acute and chronic respiratory failure with hypoxia: Secondary | ICD-10-CM | POA: Diagnosis not present

## 2020-08-16 DIAGNOSIS — E1122 Type 2 diabetes mellitus with diabetic chronic kidney disease: Secondary | ICD-10-CM | POA: Diagnosis not present

## 2020-08-16 DIAGNOSIS — I5023 Acute on chronic systolic (congestive) heart failure: Secondary | ICD-10-CM | POA: Diagnosis not present

## 2020-08-16 DIAGNOSIS — N1832 Chronic kidney disease, stage 3b: Secondary | ICD-10-CM | POA: Diagnosis not present

## 2020-08-16 DIAGNOSIS — J449 Chronic obstructive pulmonary disease, unspecified: Secondary | ICD-10-CM | POA: Diagnosis not present

## 2020-08-18 DIAGNOSIS — N1832 Chronic kidney disease, stage 3b: Secondary | ICD-10-CM | POA: Diagnosis not present

## 2020-08-18 DIAGNOSIS — J449 Chronic obstructive pulmonary disease, unspecified: Secondary | ICD-10-CM | POA: Diagnosis not present

## 2020-08-18 DIAGNOSIS — E1122 Type 2 diabetes mellitus with diabetic chronic kidney disease: Secondary | ICD-10-CM | POA: Diagnosis not present

## 2020-08-18 DIAGNOSIS — I130 Hypertensive heart and chronic kidney disease with heart failure and stage 1 through stage 4 chronic kidney disease, or unspecified chronic kidney disease: Secondary | ICD-10-CM | POA: Diagnosis not present

## 2020-08-18 DIAGNOSIS — J9621 Acute and chronic respiratory failure with hypoxia: Secondary | ICD-10-CM | POA: Diagnosis not present

## 2020-08-18 DIAGNOSIS — I5023 Acute on chronic systolic (congestive) heart failure: Secondary | ICD-10-CM | POA: Diagnosis not present

## 2020-08-19 DIAGNOSIS — N1832 Chronic kidney disease, stage 3b: Secondary | ICD-10-CM | POA: Diagnosis not present

## 2020-08-19 DIAGNOSIS — I130 Hypertensive heart and chronic kidney disease with heart failure and stage 1 through stage 4 chronic kidney disease, or unspecified chronic kidney disease: Secondary | ICD-10-CM | POA: Diagnosis not present

## 2020-08-19 DIAGNOSIS — J9621 Acute and chronic respiratory failure with hypoxia: Secondary | ICD-10-CM | POA: Diagnosis not present

## 2020-08-19 DIAGNOSIS — E1122 Type 2 diabetes mellitus with diabetic chronic kidney disease: Secondary | ICD-10-CM | POA: Diagnosis not present

## 2020-08-19 DIAGNOSIS — J449 Chronic obstructive pulmonary disease, unspecified: Secondary | ICD-10-CM | POA: Diagnosis not present

## 2020-08-19 DIAGNOSIS — I5023 Acute on chronic systolic (congestive) heart failure: Secondary | ICD-10-CM | POA: Diagnosis not present

## 2020-08-20 DIAGNOSIS — J449 Chronic obstructive pulmonary disease, unspecified: Secondary | ICD-10-CM | POA: Diagnosis not present

## 2020-08-20 DIAGNOSIS — I130 Hypertensive heart and chronic kidney disease with heart failure and stage 1 through stage 4 chronic kidney disease, or unspecified chronic kidney disease: Secondary | ICD-10-CM | POA: Diagnosis not present

## 2020-08-20 DIAGNOSIS — N1832 Chronic kidney disease, stage 3b: Secondary | ICD-10-CM | POA: Diagnosis not present

## 2020-08-20 DIAGNOSIS — E1122 Type 2 diabetes mellitus with diabetic chronic kidney disease: Secondary | ICD-10-CM | POA: Diagnosis not present

## 2020-08-20 DIAGNOSIS — J9621 Acute and chronic respiratory failure with hypoxia: Secondary | ICD-10-CM | POA: Diagnosis not present

## 2020-08-20 DIAGNOSIS — I5023 Acute on chronic systolic (congestive) heart failure: Secondary | ICD-10-CM | POA: Diagnosis not present

## 2020-08-21 DIAGNOSIS — Z7901 Long term (current) use of anticoagulants: Secondary | ICD-10-CM | POA: Diagnosis not present

## 2020-08-21 DIAGNOSIS — I509 Heart failure, unspecified: Secondary | ICD-10-CM | POA: Diagnosis not present

## 2020-08-21 DIAGNOSIS — E119 Type 2 diabetes mellitus without complications: Secondary | ICD-10-CM | POA: Diagnosis not present

## 2020-08-21 DIAGNOSIS — E785 Hyperlipidemia, unspecified: Secondary | ICD-10-CM | POA: Diagnosis present

## 2020-08-21 DIAGNOSIS — I110 Hypertensive heart disease with heart failure: Secondary | ICD-10-CM | POA: Diagnosis not present

## 2020-08-21 DIAGNOSIS — Z930 Tracheostomy status: Secondary | ICD-10-CM | POA: Diagnosis not present

## 2020-08-21 DIAGNOSIS — Z86718 Personal history of other venous thrombosis and embolism: Secondary | ICD-10-CM | POA: Diagnosis not present

## 2020-08-21 DIAGNOSIS — J449 Chronic obstructive pulmonary disease, unspecified: Secondary | ICD-10-CM | POA: Diagnosis present

## 2020-08-21 DIAGNOSIS — Z794 Long term (current) use of insulin: Secondary | ICD-10-CM | POA: Diagnosis not present

## 2020-08-21 DIAGNOSIS — Z20822 Contact with and (suspected) exposure to covid-19: Secondary | ICD-10-CM | POA: Diagnosis present

## 2020-08-21 DIAGNOSIS — I2510 Atherosclerotic heart disease of native coronary artery without angina pectoris: Secondary | ICD-10-CM | POA: Diagnosis not present

## 2020-08-21 DIAGNOSIS — J9611 Chronic respiratory failure with hypoxia: Secondary | ICD-10-CM | POA: Diagnosis not present

## 2020-08-21 DIAGNOSIS — Z880 Allergy status to penicillin: Secondary | ICD-10-CM | POA: Diagnosis not present

## 2020-08-21 DIAGNOSIS — F419 Anxiety disorder, unspecified: Secondary | ICD-10-CM | POA: Diagnosis present

## 2020-08-21 DIAGNOSIS — R778 Other specified abnormalities of plasma proteins: Secondary | ICD-10-CM | POA: Diagnosis not present

## 2020-08-21 DIAGNOSIS — E1165 Type 2 diabetes mellitus with hyperglycemia: Secondary | ICD-10-CM | POA: Diagnosis present

## 2020-08-21 DIAGNOSIS — F17200 Nicotine dependence, unspecified, uncomplicated: Secondary | ICD-10-CM | POA: Diagnosis present

## 2020-08-21 DIAGNOSIS — R0602 Shortness of breath: Secondary | ICD-10-CM | POA: Diagnosis not present

## 2020-08-21 DIAGNOSIS — J9621 Acute and chronic respiratory failure with hypoxia: Secondary | ICD-10-CM | POA: Diagnosis not present

## 2020-08-21 DIAGNOSIS — I5043 Acute on chronic combined systolic (congestive) and diastolic (congestive) heart failure: Secondary | ICD-10-CM | POA: Diagnosis not present

## 2020-08-21 DIAGNOSIS — Z951 Presence of aortocoronary bypass graft: Secondary | ICD-10-CM | POA: Diagnosis not present

## 2020-08-21 DIAGNOSIS — I480 Paroxysmal atrial fibrillation: Secondary | ICD-10-CM | POA: Diagnosis present

## 2020-08-21 DIAGNOSIS — I5023 Acute on chronic systolic (congestive) heart failure: Secondary | ICD-10-CM | POA: Diagnosis present

## 2020-08-21 DIAGNOSIS — Z6841 Body Mass Index (BMI) 40.0 and over, adult: Secondary | ICD-10-CM | POA: Diagnosis not present

## 2020-08-21 DIAGNOSIS — E1140 Type 2 diabetes mellitus with diabetic neuropathy, unspecified: Secondary | ICD-10-CM | POA: Diagnosis not present

## 2020-08-21 DIAGNOSIS — I959 Hypotension, unspecified: Secondary | ICD-10-CM | POA: Diagnosis not present

## 2020-08-21 DIAGNOSIS — Z9111 Patient's noncompliance with dietary regimen: Secondary | ICD-10-CM | POA: Diagnosis not present

## 2020-08-21 DIAGNOSIS — I4892 Unspecified atrial flutter: Secondary | ICD-10-CM | POA: Diagnosis not present

## 2020-08-22 DIAGNOSIS — J9611 Chronic respiratory failure with hypoxia: Secondary | ICD-10-CM | POA: Diagnosis not present

## 2020-08-22 DIAGNOSIS — I5023 Acute on chronic systolic (congestive) heart failure: Secondary | ICD-10-CM | POA: Diagnosis not present

## 2020-08-22 DIAGNOSIS — Z7901 Long term (current) use of anticoagulants: Secondary | ICD-10-CM | POA: Diagnosis not present

## 2020-08-22 DIAGNOSIS — I2510 Atherosclerotic heart disease of native coronary artery without angina pectoris: Secondary | ICD-10-CM | POA: Diagnosis not present

## 2020-08-25 DIAGNOSIS — J9611 Chronic respiratory failure with hypoxia: Secondary | ICD-10-CM | POA: Diagnosis not present

## 2020-08-25 DIAGNOSIS — I5023 Acute on chronic systolic (congestive) heart failure: Secondary | ICD-10-CM | POA: Diagnosis not present

## 2020-08-25 DIAGNOSIS — I2510 Atherosclerotic heart disease of native coronary artery without angina pectoris: Secondary | ICD-10-CM | POA: Diagnosis not present

## 2020-08-25 DIAGNOSIS — Z7901 Long term (current) use of anticoagulants: Secondary | ICD-10-CM | POA: Diagnosis not present

## 2020-08-26 DIAGNOSIS — J9621 Acute and chronic respiratory failure with hypoxia: Secondary | ICD-10-CM | POA: Diagnosis not present

## 2020-08-26 DIAGNOSIS — J449 Chronic obstructive pulmonary disease, unspecified: Secondary | ICD-10-CM | POA: Diagnosis not present

## 2020-08-26 DIAGNOSIS — I5023 Acute on chronic systolic (congestive) heart failure: Secondary | ICD-10-CM | POA: Diagnosis not present

## 2020-08-26 DIAGNOSIS — N1832 Chronic kidney disease, stage 3b: Secondary | ICD-10-CM | POA: Diagnosis not present

## 2020-08-26 DIAGNOSIS — I130 Hypertensive heart and chronic kidney disease with heart failure and stage 1 through stage 4 chronic kidney disease, or unspecified chronic kidney disease: Secondary | ICD-10-CM | POA: Diagnosis not present

## 2020-08-26 DIAGNOSIS — E1122 Type 2 diabetes mellitus with diabetic chronic kidney disease: Secondary | ICD-10-CM | POA: Diagnosis not present

## 2020-08-28 DIAGNOSIS — E1122 Type 2 diabetes mellitus with diabetic chronic kidney disease: Secondary | ICD-10-CM | POA: Diagnosis not present

## 2020-08-28 DIAGNOSIS — N1832 Chronic kidney disease, stage 3b: Secondary | ICD-10-CM | POA: Diagnosis not present

## 2020-08-28 DIAGNOSIS — I130 Hypertensive heart and chronic kidney disease with heart failure and stage 1 through stage 4 chronic kidney disease, or unspecified chronic kidney disease: Secondary | ICD-10-CM | POA: Diagnosis not present

## 2020-08-28 DIAGNOSIS — J9621 Acute and chronic respiratory failure with hypoxia: Secondary | ICD-10-CM | POA: Diagnosis not present

## 2020-08-28 DIAGNOSIS — I5023 Acute on chronic systolic (congestive) heart failure: Secondary | ICD-10-CM | POA: Diagnosis not present

## 2020-08-28 DIAGNOSIS — J449 Chronic obstructive pulmonary disease, unspecified: Secondary | ICD-10-CM | POA: Diagnosis not present

## 2020-09-01 DIAGNOSIS — I5030 Unspecified diastolic (congestive) heart failure: Secondary | ICD-10-CM | POA: Diagnosis not present

## 2020-09-01 DIAGNOSIS — Z299 Encounter for prophylactic measures, unspecified: Secondary | ICD-10-CM | POA: Diagnosis not present

## 2020-09-01 DIAGNOSIS — I480 Paroxysmal atrial fibrillation: Secondary | ICD-10-CM | POA: Diagnosis not present

## 2020-09-01 DIAGNOSIS — I10 Essential (primary) hypertension: Secondary | ICD-10-CM | POA: Diagnosis not present

## 2020-09-01 DIAGNOSIS — R251 Tremor, unspecified: Secondary | ICD-10-CM | POA: Diagnosis not present

## 2020-09-02 DIAGNOSIS — E1122 Type 2 diabetes mellitus with diabetic chronic kidney disease: Secondary | ICD-10-CM | POA: Diagnosis not present

## 2020-09-02 DIAGNOSIS — J449 Chronic obstructive pulmonary disease, unspecified: Secondary | ICD-10-CM | POA: Diagnosis not present

## 2020-09-02 DIAGNOSIS — J9621 Acute and chronic respiratory failure with hypoxia: Secondary | ICD-10-CM | POA: Diagnosis not present

## 2020-09-02 DIAGNOSIS — I130 Hypertensive heart and chronic kidney disease with heart failure and stage 1 through stage 4 chronic kidney disease, or unspecified chronic kidney disease: Secondary | ICD-10-CM | POA: Diagnosis not present

## 2020-09-02 DIAGNOSIS — I5023 Acute on chronic systolic (congestive) heart failure: Secondary | ICD-10-CM | POA: Diagnosis not present

## 2020-09-02 DIAGNOSIS — N1832 Chronic kidney disease, stage 3b: Secondary | ICD-10-CM | POA: Diagnosis not present

## 2020-09-03 DIAGNOSIS — J9621 Acute and chronic respiratory failure with hypoxia: Secondary | ICD-10-CM | POA: Diagnosis not present

## 2020-09-04 DIAGNOSIS — E1122 Type 2 diabetes mellitus with diabetic chronic kidney disease: Secondary | ICD-10-CM | POA: Diagnosis not present

## 2020-09-04 DIAGNOSIS — J449 Chronic obstructive pulmonary disease, unspecified: Secondary | ICD-10-CM | POA: Diagnosis not present

## 2020-09-04 DIAGNOSIS — I5023 Acute on chronic systolic (congestive) heart failure: Secondary | ICD-10-CM | POA: Diagnosis not present

## 2020-09-04 DIAGNOSIS — I130 Hypertensive heart and chronic kidney disease with heart failure and stage 1 through stage 4 chronic kidney disease, or unspecified chronic kidney disease: Secondary | ICD-10-CM | POA: Diagnosis not present

## 2020-09-04 DIAGNOSIS — N1832 Chronic kidney disease, stage 3b: Secondary | ICD-10-CM | POA: Diagnosis not present

## 2020-09-04 DIAGNOSIS — J9621 Acute and chronic respiratory failure with hypoxia: Secondary | ICD-10-CM | POA: Diagnosis not present

## 2020-09-04 DIAGNOSIS — E119 Type 2 diabetes mellitus without complications: Secondary | ICD-10-CM | POA: Diagnosis not present

## 2020-09-09 DIAGNOSIS — E1122 Type 2 diabetes mellitus with diabetic chronic kidney disease: Secondary | ICD-10-CM | POA: Diagnosis not present

## 2020-09-09 DIAGNOSIS — N1832 Chronic kidney disease, stage 3b: Secondary | ICD-10-CM | POA: Diagnosis not present

## 2020-09-09 DIAGNOSIS — I5023 Acute on chronic systolic (congestive) heart failure: Secondary | ICD-10-CM | POA: Diagnosis not present

## 2020-09-09 DIAGNOSIS — I130 Hypertensive heart and chronic kidney disease with heart failure and stage 1 through stage 4 chronic kidney disease, or unspecified chronic kidney disease: Secondary | ICD-10-CM | POA: Diagnosis not present

## 2020-09-09 DIAGNOSIS — J9621 Acute and chronic respiratory failure with hypoxia: Secondary | ICD-10-CM | POA: Diagnosis not present

## 2020-09-09 DIAGNOSIS — J449 Chronic obstructive pulmonary disease, unspecified: Secondary | ICD-10-CM | POA: Diagnosis not present

## 2020-09-11 DIAGNOSIS — Z86718 Personal history of other venous thrombosis and embolism: Secondary | ICD-10-CM | POA: Diagnosis not present

## 2020-09-11 DIAGNOSIS — K625 Hemorrhage of anus and rectum: Secondary | ICD-10-CM | POA: Diagnosis not present

## 2020-09-11 DIAGNOSIS — I480 Paroxysmal atrial fibrillation: Secondary | ICD-10-CM | POA: Diagnosis not present

## 2020-09-11 DIAGNOSIS — Z930 Tracheostomy status: Secondary | ICD-10-CM | POA: Diagnosis not present

## 2020-09-11 DIAGNOSIS — F17210 Nicotine dependence, cigarettes, uncomplicated: Secondary | ICD-10-CM | POA: Diagnosis not present

## 2020-09-11 DIAGNOSIS — I2510 Atherosclerotic heart disease of native coronary artery without angina pectoris: Secondary | ICD-10-CM | POA: Diagnosis not present

## 2020-09-11 DIAGNOSIS — K5900 Constipation, unspecified: Secondary | ICD-10-CM | POA: Diagnosis not present

## 2020-09-11 DIAGNOSIS — E1122 Type 2 diabetes mellitus with diabetic chronic kidney disease: Secondary | ICD-10-CM | POA: Diagnosis not present

## 2020-09-11 DIAGNOSIS — I959 Hypotension, unspecified: Secondary | ICD-10-CM | POA: Diagnosis not present

## 2020-09-11 DIAGNOSIS — K219 Gastro-esophageal reflux disease without esophagitis: Secondary | ICD-10-CM | POA: Diagnosis not present

## 2020-09-11 DIAGNOSIS — E1140 Type 2 diabetes mellitus with diabetic neuropathy, unspecified: Secondary | ICD-10-CM | POA: Diagnosis not present

## 2020-09-11 DIAGNOSIS — I4892 Unspecified atrial flutter: Secondary | ICD-10-CM | POA: Diagnosis not present

## 2020-09-11 DIAGNOSIS — N1832 Chronic kidney disease, stage 3b: Secondary | ICD-10-CM | POA: Diagnosis not present

## 2020-09-11 DIAGNOSIS — I5023 Acute on chronic systolic (congestive) heart failure: Secondary | ICD-10-CM | POA: Diagnosis not present

## 2020-09-11 DIAGNOSIS — I252 Old myocardial infarction: Secondary | ICD-10-CM | POA: Diagnosis not present

## 2020-09-11 DIAGNOSIS — E1165 Type 2 diabetes mellitus with hyperglycemia: Secondary | ICD-10-CM | POA: Diagnosis not present

## 2020-09-11 DIAGNOSIS — D6862 Lupus anticoagulant syndrome: Secondary | ICD-10-CM | POA: Diagnosis not present

## 2020-09-11 DIAGNOSIS — F32A Depression, unspecified: Secondary | ICD-10-CM | POA: Diagnosis not present

## 2020-09-11 DIAGNOSIS — J9621 Acute and chronic respiratory failure with hypoxia: Secondary | ICD-10-CM | POA: Diagnosis not present

## 2020-09-11 DIAGNOSIS — J449 Chronic obstructive pulmonary disease, unspecified: Secondary | ICD-10-CM | POA: Diagnosis not present

## 2020-09-11 DIAGNOSIS — Z6835 Body mass index (BMI) 35.0-35.9, adult: Secondary | ICD-10-CM | POA: Diagnosis not present

## 2020-09-11 DIAGNOSIS — I255 Ischemic cardiomyopathy: Secondary | ICD-10-CM | POA: Diagnosis not present

## 2020-09-11 DIAGNOSIS — I130 Hypertensive heart and chronic kidney disease with heart failure and stage 1 through stage 4 chronic kidney disease, or unspecified chronic kidney disease: Secondary | ICD-10-CM | POA: Diagnosis not present

## 2020-09-11 DIAGNOSIS — F419 Anxiety disorder, unspecified: Secondary | ICD-10-CM | POA: Diagnosis not present

## 2020-09-12 DIAGNOSIS — N1832 Chronic kidney disease, stage 3b: Secondary | ICD-10-CM | POA: Diagnosis not present

## 2020-09-12 DIAGNOSIS — I130 Hypertensive heart and chronic kidney disease with heart failure and stage 1 through stage 4 chronic kidney disease, or unspecified chronic kidney disease: Secondary | ICD-10-CM | POA: Diagnosis not present

## 2020-09-12 DIAGNOSIS — I5023 Acute on chronic systolic (congestive) heart failure: Secondary | ICD-10-CM | POA: Diagnosis not present

## 2020-09-12 DIAGNOSIS — J9621 Acute and chronic respiratory failure with hypoxia: Secondary | ICD-10-CM | POA: Diagnosis not present

## 2020-09-12 DIAGNOSIS — E1122 Type 2 diabetes mellitus with diabetic chronic kidney disease: Secondary | ICD-10-CM | POA: Diagnosis not present

## 2020-09-12 DIAGNOSIS — J449 Chronic obstructive pulmonary disease, unspecified: Secondary | ICD-10-CM | POA: Diagnosis not present

## 2020-09-15 DIAGNOSIS — J9621 Acute and chronic respiratory failure with hypoxia: Secondary | ICD-10-CM | POA: Diagnosis not present

## 2020-09-15 DIAGNOSIS — E1122 Type 2 diabetes mellitus with diabetic chronic kidney disease: Secondary | ICD-10-CM | POA: Diagnosis not present

## 2020-09-15 DIAGNOSIS — I130 Hypertensive heart and chronic kidney disease with heart failure and stage 1 through stage 4 chronic kidney disease, or unspecified chronic kidney disease: Secondary | ICD-10-CM | POA: Diagnosis not present

## 2020-09-15 DIAGNOSIS — N1832 Chronic kidney disease, stage 3b: Secondary | ICD-10-CM | POA: Diagnosis not present

## 2020-09-15 DIAGNOSIS — I5023 Acute on chronic systolic (congestive) heart failure: Secondary | ICD-10-CM | POA: Diagnosis not present

## 2020-09-15 DIAGNOSIS — J449 Chronic obstructive pulmonary disease, unspecified: Secondary | ICD-10-CM | POA: Diagnosis not present

## 2020-09-16 DIAGNOSIS — J449 Chronic obstructive pulmonary disease, unspecified: Secondary | ICD-10-CM | POA: Diagnosis not present

## 2020-09-16 DIAGNOSIS — I130 Hypertensive heart and chronic kidney disease with heart failure and stage 1 through stage 4 chronic kidney disease, or unspecified chronic kidney disease: Secondary | ICD-10-CM | POA: Diagnosis not present

## 2020-09-16 DIAGNOSIS — J9621 Acute and chronic respiratory failure with hypoxia: Secondary | ICD-10-CM | POA: Diagnosis not present

## 2020-09-16 DIAGNOSIS — N1832 Chronic kidney disease, stage 3b: Secondary | ICD-10-CM | POA: Diagnosis not present

## 2020-09-16 DIAGNOSIS — E1122 Type 2 diabetes mellitus with diabetic chronic kidney disease: Secondary | ICD-10-CM | POA: Diagnosis not present

## 2020-09-16 DIAGNOSIS — I5023 Acute on chronic systolic (congestive) heart failure: Secondary | ICD-10-CM | POA: Diagnosis not present

## 2020-09-17 DIAGNOSIS — J9621 Acute and chronic respiratory failure with hypoxia: Secondary | ICD-10-CM | POA: Diagnosis not present

## 2020-09-17 DIAGNOSIS — J449 Chronic obstructive pulmonary disease, unspecified: Secondary | ICD-10-CM | POA: Diagnosis not present

## 2020-09-17 DIAGNOSIS — N1832 Chronic kidney disease, stage 3b: Secondary | ICD-10-CM | POA: Diagnosis not present

## 2020-09-17 DIAGNOSIS — E1122 Type 2 diabetes mellitus with diabetic chronic kidney disease: Secondary | ICD-10-CM | POA: Diagnosis not present

## 2020-09-17 DIAGNOSIS — I5023 Acute on chronic systolic (congestive) heart failure: Secondary | ICD-10-CM | POA: Diagnosis not present

## 2020-09-17 DIAGNOSIS — I130 Hypertensive heart and chronic kidney disease with heart failure and stage 1 through stage 4 chronic kidney disease, or unspecified chronic kidney disease: Secondary | ICD-10-CM | POA: Diagnosis not present

## 2020-09-18 DIAGNOSIS — I5023 Acute on chronic systolic (congestive) heart failure: Secondary | ICD-10-CM | POA: Diagnosis not present

## 2020-09-18 DIAGNOSIS — I130 Hypertensive heart and chronic kidney disease with heart failure and stage 1 through stage 4 chronic kidney disease, or unspecified chronic kidney disease: Secondary | ICD-10-CM | POA: Diagnosis not present

## 2020-09-18 DIAGNOSIS — N1832 Chronic kidney disease, stage 3b: Secondary | ICD-10-CM | POA: Diagnosis not present

## 2020-09-18 DIAGNOSIS — J449 Chronic obstructive pulmonary disease, unspecified: Secondary | ICD-10-CM | POA: Diagnosis not present

## 2020-09-18 DIAGNOSIS — J9621 Acute and chronic respiratory failure with hypoxia: Secondary | ICD-10-CM | POA: Diagnosis not present

## 2020-09-18 DIAGNOSIS — E1122 Type 2 diabetes mellitus with diabetic chronic kidney disease: Secondary | ICD-10-CM | POA: Diagnosis not present

## 2020-09-22 NOTE — Progress Notes (Deleted)
Referring Provider: Monico Blitz, MD Primary Care Physician:  Monico Blitz, MD Primary GI Physician: Dr. Gala Romney  No chief complaint on file.   HPI:   Theresa [name] is a 55 y.o. female presenting today for hospital follow-up of rectal bleeding and rectal pain. Multiple medical issues including CHF, DVTs on Coumadin, COPD, diabetes, lupus anticoagulant syndrome, CAD, history of STEMI and CABG, chronic respiratory failure, chronic trach since 2002. Also with history of sepsis with suspected cholangitis with associated elevated LFTs in October 2021 treated supportively with antibiotics. Due to complex medical history, no surgical intervention recommended. There was discussion of possible cholecystostomy tube, but patient had clinical improvement with antibiotics. Bilirubin normalized and LFTs are trending down at discharge. Notably, patient reported being evaluated in [facility] for cholecystectomy due to symptomatic gallstones in 2018 but was told she is a high surgical risk candidate.   Patient was last seen during hospitalization between 08/01/20-08/07/20. She was admitted with acute on chronic respiratory failure in the setting of COPD exacerbation.  GI was consulted due to rectal pain and rectal bleeding.  This was in the setting of constipation, fecal impaction, and supratherapeutic INR.  She reported chronic constipation.  Rectal bleeding self resolved.  On limited external rectal exam, no obvious fissure or hemorrhoids.  She has never had a colonoscopy.  Hemoglobin remained above 12 during admission. She was treated with Anusol twice daily and started on MiraLAX daily with recommendations to undergo colonoscopy in the outpatient setting.  She was again admitted 12/16 - 08/25/2020 due to acute on chronic congestive heart failure at [facility].  She was diuresed and responded well.  Home med furosemide was changed to torsemide 20 mg twice daily. Notably, hemoglobin on 12/18 was 9.8. Alk phos  was also  elevated, max 194 on 12/16, otherwise LFTs wnl.   Today:    Send request to coumadin clinic to see patient due to upcoming procedure and need to hold Coumadin. Does she need bridging? Is she too high risk?   Past Medical History:  Diagnosis Date  . Acute kidney failure with lesion of tubular necrosis (HCC)   . Acute systolic heart failure ([place])   . CAD (coronary artery disease)    a. s/p prior PCI. b. CABG 2007 at [facility] in [place] 2007. c. inferior STEMI 10/2015 s/p DES to dSVG-PDA.  . Cardiac arrest ([place])   . Cervical cancer ([place])   . Chronic diastolic CHF (congestive heart failure) ([place])   . Chronic respiratory failure ([place])    s/p tracheostomy 2002  . Chronic RUQ pain   . COPD (chronic obstructive pulmonary disease) ([place])   . Diabetes mellitus ([place])   . DVT (deep venous thrombosis) ([place])   . Endometriosis   . History of gallstones 01/2016   seen on Ultrasound  . History of HIDA scan 11/2016   normal  . HTN (hypertension)   . Hyperlipidemia   . Lupus anticoagulant disorder (HCC)    on coumadin  . Morbid obesity ([place])   . Psoriasis   . ST elevation (STEMI) myocardial infarction involving right coronary artery ([place]) 10/29/15   stent to VG to PDA  . Toe fracture, right 03/29/2018  . Tracheostomy in place [facility]), chronic since 2002 11/03/2015    Past Surgical History:  Procedure Laterality Date  . CARDIAC CATHETERIZATION N/A 10/29/2015   Procedure: Left Heart Cath and Cors/Grafts Angiography;  Surgeon: Burnell Blanks, MD;  Location: [place] CV [facility];  Service: Cardiovascular;  Laterality: N/A;  . CARDIAC CATHETERIZATION  10/29/2015   Procedure: Coronary Stent Intervention;  Surgeon: Burnell Blanks, MD;  Location: [place] CV LAB;  Service: Cardiovascular;;  . CAROTID STENT    . CESAREAN SECTION WITH BILATERAL TUBAL LIGATION    . CORONARY ARTERY BYPASS GRAFT  2007   2V  . IR GASTROSTOMY TUBE MOD SED  11/13/2018  . RIGHT/LEFT HEART  CATH AND CORONARY/GRAFT ANGIOGRAPHY N/A 03/24/2018   Procedure: RIGHT/LEFT HEART CATH AND CORONARY/GRAFT ANGIOGRAPHY;  Surgeon: Belva Crome, MD;  Location: [place] CV LAB;  Service: Cardiovascular;  Laterality: N/A;  . TRACHEOSTOMY      Current Outpatient Medications  Medication Sig Dispense Refill  . albuterol (PROVENTIL) (2.5 MG/3ML) 0.083% nebulizer solution Take 3 mLs (2.5 mg total) by nebulization every 4 (four) hours as needed for wheezing.    Marland Kitchen albuterol (VENTOLIN HFA) 108 (90 Base) MCG/ACT inhaler Inhale 2 puffs into the lungs every 4 (four) hours as needed for wheezing or shortness of breath.    Marland Kitchen amiodarone (PACERONE) 200 MG tablet Take 1 tablet (200 mg total) by mouth daily. 30 tablet 0  . atorvastatin (LIPITOR) 20 MG tablet Take 20 mg by mouth daily.    . bumetanide (BUMEX) 2 MG tablet Take 1 tablet (2 mg total) by mouth daily. Takes 6 mg am and 4 mg pm 180 tablet 1  . fluticasone furoate-vilanterol (BREO ELLIPTA) 100-25 MCG/INH AEPB Inhale 1 puff into the lungs daily. 28 each 1  . gabapentin (NEURONTIN) 300 MG capsule Take 3 capsules (900 mg total) by mouth at bedtime.    . hydrocortisone (ANUSOL-HC) 25 MG suppository Place 1 suppository (25 mg total) rectally 2 (two) times daily. 12 suppository 0  . insulin degludec (TRESIBA FLEXTOUCH) 100 UNIT/ML FlexTouch Pen Inject 30 Units into the skin at bedtime.    . mometasone (ELOCON) 0.1 % ointment Apply 1 application topically daily as needed.    . nitroGLYCERIN (NITROSTAT) 0.4 MG SL tablet Place 0.4 mg under the tongue every 5 (five) minutes as needed for chest pain.    Marland Kitchen NOVOLOG FLEXPEN 100 UNIT/ML FlexPen Inject 1-10 Units into the skin 3 (three) times daily before meals.    Marland Kitchen OZEMPIC, 0.25 OR 0.5 MG/DOSE, 2 MG/1.5ML SOPN Inject 0.5 mg into the skin every Wednesday.     . pantoprazole (PROTONIX) 20 MG tablet Take 20 mg by mouth daily.    . polyethylene glycol (MIRALAX / GLYCOLAX) 17 g packet Take 17 g by mouth daily as needed  for mild constipation. 14 each 0  . potassium chloride SA (KLOR-CON) 20 MEQ tablet Take 2 tablets (40 mEq total) by mouth daily. 60 tablet 0  . predniSONE (DELTASONE) 10 MG tablet Take 4 tabs for 3 days, then 3 tabs for 3 days, then 2 tabs for 3 days, then 1 tab for 3 days, then 1/2 tab for 4 days. 32 tablet 0  . Pseudoeph-Doxylamine-DM-APAP (NYQUIL PO) Take 20 mLs by mouth daily.    . sertraline (ZOLOFT) 50 MG tablet Take 50 mg by mouth at bedtime.     . tamsulosin (FLOMAX) 0.4 MG CAPS capsule Take 0.4 mg by mouth daily.    . traMADol (ULTRAM) 50 MG tablet Take 1 tablet (50 mg total) by mouth 3 (three) times daily as needed for moderate pain. 30 tablet 0  . warfarin (COUMADIN) 2.5 MG tablet Take 0.5-1 tablets (1.25-2.5 mg total) by mouth See admin instructions. 2.68m MWF and 1.275mevery other day.  No current facility-administered medications for this visit.    Allergies as of 09/24/2020 - Review Complete 08/02/2020  Allergen Reaction Noted  . Penicillins Rash   . Ciprofloxacin  05/07/2016  . Ibuprofen Rash 06/21/2016    Family History  Problem Relation Age of Onset  . Hypertension Mother   . Diabetes Mother   . Hypertension Father   . Diabetes Father   . Colon cancer Neg Hx   . Colon polyps Neg Hx     Social History   Socioeconomic History  . Marital status: Married    Spouse name: Not on file  . Number of children: Not on file  . Years of education: Not on file  . Highest education level: Not on file  Occupational History  . Not on file  Tobacco Use  . Smoking status: Current Every Day Smoker    Packs/day: 0.75    Types: Cigarettes    Start date: 10/23/1978  . Smokeless tobacco: Never Used  . Tobacco comment: 1/2 pack - trying to cut back   Vaping Use  . Vaping Use: Never used  Substance and Sexual Activity  . Alcohol use: No    Alcohol/week: 0.0 standard drinks  . Drug use: No  . Sexual activity: Not on file  Other Topics Concern  . Not on file  Social  History Narrative  . Not on file   Social Determinants of Health   Financial Resource Strain: Not on file  Food Insecurity: Not on file  Transportation Needs: Not on file  Physical Activity: Not on file  Stress: Not on file  Social Connections: Not on file    Review of Systems: Gen: Denies fever, chills, anorexia. Denies fatigue, weakness, weight loss.  CV: Denies chest pain, palpitations, syncope, peripheral edema, and claudication. Resp: Denies dyspnea at rest, cough, wheezing, coughing up blood, and pleurisy. GI: Denies vomiting blood, jaundice, and fecal incontinence.   Denies dysphagia or odynophagia. Derm: Denies rash, itching, dry skin Psych: Denies depression, anxiety, memory loss, confusion. No homicidal or suicidal ideation.  Heme: Denies bruising, bleeding, and enlarged lymph nodes.  Physical Exam: There were no vitals taken for this visit. General:   Alert and oriented. No distress noted. Pleasant and cooperative.  Head:  Normocephalic and atraumatic. Eyes:  Conjuctiva clear without scleral icterus. Mouth:  Oral mucosa pink and moist. Good dentition. No lesions. Heart:  S1, S2 present without murmurs appreciated. Lungs:  Clear to auscultation bilaterally. No wheezes, rales, or rhonchi. No distress.  Abdomen:  +BS, soft, non-tender and non-distended. No rebound or guarding. No HSM or masses noted. Msk:  Symmetrical without gross deformities. Normal posture. Extremities:  Without edema. Neurologic:  Alert and  oriented x4 Psych:  Alert and cooperative. Normal mood and affect.

## 2020-09-23 DIAGNOSIS — I5023 Acute on chronic systolic (congestive) heart failure: Secondary | ICD-10-CM | POA: Diagnosis not present

## 2020-09-23 DIAGNOSIS — N1832 Chronic kidney disease, stage 3b: Secondary | ICD-10-CM | POA: Diagnosis not present

## 2020-09-23 DIAGNOSIS — J449 Chronic obstructive pulmonary disease, unspecified: Secondary | ICD-10-CM | POA: Diagnosis not present

## 2020-09-23 DIAGNOSIS — E1122 Type 2 diabetes mellitus with diabetic chronic kidney disease: Secondary | ICD-10-CM | POA: Diagnosis not present

## 2020-09-23 DIAGNOSIS — I130 Hypertensive heart and chronic kidney disease with heart failure and stage 1 through stage 4 chronic kidney disease, or unspecified chronic kidney disease: Secondary | ICD-10-CM | POA: Diagnosis not present

## 2020-09-23 DIAGNOSIS — J9621 Acute and chronic respiratory failure with hypoxia: Secondary | ICD-10-CM | POA: Diagnosis not present

## 2020-09-24 ENCOUNTER — Ambulatory Visit: Payer: Medicare Other | Admitting: Gastroenterology

## 2020-09-24 ENCOUNTER — Encounter: Payer: Self-pay | Admitting: Internal Medicine

## 2020-09-24 DIAGNOSIS — I130 Hypertensive heart and chronic kidney disease with heart failure and stage 1 through stage 4 chronic kidney disease, or unspecified chronic kidney disease: Secondary | ICD-10-CM | POA: Diagnosis not present

## 2020-09-24 DIAGNOSIS — E1122 Type 2 diabetes mellitus with diabetic chronic kidney disease: Secondary | ICD-10-CM | POA: Diagnosis not present

## 2020-09-24 DIAGNOSIS — J449 Chronic obstructive pulmonary disease, unspecified: Secondary | ICD-10-CM | POA: Diagnosis not present

## 2020-09-24 DIAGNOSIS — I5023 Acute on chronic systolic (congestive) heart failure: Secondary | ICD-10-CM | POA: Diagnosis not present

## 2020-09-24 DIAGNOSIS — N1832 Chronic kidney disease, stage 3b: Secondary | ICD-10-CM | POA: Diagnosis not present

## 2020-09-24 DIAGNOSIS — J9621 Acute and chronic respiratory failure with hypoxia: Secondary | ICD-10-CM | POA: Diagnosis not present

## 2020-09-25 DIAGNOSIS — J449 Chronic obstructive pulmonary disease, unspecified: Secondary | ICD-10-CM | POA: Diagnosis not present

## 2020-09-25 DIAGNOSIS — N1832 Chronic kidney disease, stage 3b: Secondary | ICD-10-CM | POA: Diagnosis not present

## 2020-09-25 DIAGNOSIS — I5023 Acute on chronic systolic (congestive) heart failure: Secondary | ICD-10-CM | POA: Diagnosis not present

## 2020-09-25 DIAGNOSIS — I130 Hypertensive heart and chronic kidney disease with heart failure and stage 1 through stage 4 chronic kidney disease, or unspecified chronic kidney disease: Secondary | ICD-10-CM | POA: Diagnosis not present

## 2020-09-25 DIAGNOSIS — E1122 Type 2 diabetes mellitus with diabetic chronic kidney disease: Secondary | ICD-10-CM | POA: Diagnosis not present

## 2020-09-25 DIAGNOSIS — J9621 Acute and chronic respiratory failure with hypoxia: Secondary | ICD-10-CM | POA: Diagnosis not present

## 2020-09-26 DIAGNOSIS — I5023 Acute on chronic systolic (congestive) heart failure: Secondary | ICD-10-CM | POA: Diagnosis not present

## 2020-09-26 DIAGNOSIS — I130 Hypertensive heart and chronic kidney disease with heart failure and stage 1 through stage 4 chronic kidney disease, or unspecified chronic kidney disease: Secondary | ICD-10-CM | POA: Diagnosis not present

## 2020-09-26 DIAGNOSIS — J9621 Acute and chronic respiratory failure with hypoxia: Secondary | ICD-10-CM | POA: Diagnosis not present

## 2020-09-26 DIAGNOSIS — N1832 Chronic kidney disease, stage 3b: Secondary | ICD-10-CM | POA: Diagnosis not present

## 2020-09-26 DIAGNOSIS — E1122 Type 2 diabetes mellitus with diabetic chronic kidney disease: Secondary | ICD-10-CM | POA: Diagnosis not present

## 2020-09-26 DIAGNOSIS — J449 Chronic obstructive pulmonary disease, unspecified: Secondary | ICD-10-CM | POA: Diagnosis not present

## 2020-09-29 DIAGNOSIS — J449 Chronic obstructive pulmonary disease, unspecified: Secondary | ICD-10-CM | POA: Diagnosis not present

## 2020-09-29 DIAGNOSIS — N1832 Chronic kidney disease, stage 3b: Secondary | ICD-10-CM | POA: Diagnosis not present

## 2020-09-29 DIAGNOSIS — I130 Hypertensive heart and chronic kidney disease with heart failure and stage 1 through stage 4 chronic kidney disease, or unspecified chronic kidney disease: Secondary | ICD-10-CM | POA: Diagnosis not present

## 2020-09-29 DIAGNOSIS — E1122 Type 2 diabetes mellitus with diabetic chronic kidney disease: Secondary | ICD-10-CM | POA: Diagnosis not present

## 2020-09-29 DIAGNOSIS — J9621 Acute and chronic respiratory failure with hypoxia: Secondary | ICD-10-CM | POA: Diagnosis not present

## 2020-09-29 DIAGNOSIS — I5023 Acute on chronic systolic (congestive) heart failure: Secondary | ICD-10-CM | POA: Diagnosis not present

## 2020-10-02 DIAGNOSIS — Z880 Allergy status to penicillin: Secondary | ICD-10-CM | POA: Diagnosis not present

## 2020-10-02 DIAGNOSIS — E039 Hypothyroidism, unspecified: Secondary | ICD-10-CM | POA: Diagnosis not present

## 2020-10-02 DIAGNOSIS — Z7901 Long term (current) use of anticoagulants: Secondary | ICD-10-CM | POA: Diagnosis not present

## 2020-10-02 DIAGNOSIS — J811 Chronic pulmonary edema: Secondary | ICD-10-CM | POA: Diagnosis not present

## 2020-10-02 DIAGNOSIS — E032 Hypothyroidism due to medicaments and other exogenous substances: Secondary | ICD-10-CM | POA: Diagnosis present

## 2020-10-02 DIAGNOSIS — I130 Hypertensive heart and chronic kidney disease with heart failure and stage 1 through stage 4 chronic kidney disease, or unspecified chronic kidney disease: Secondary | ICD-10-CM | POA: Diagnosis not present

## 2020-10-02 DIAGNOSIS — J9621 Acute and chronic respiratory failure with hypoxia: Secondary | ICD-10-CM | POA: Diagnosis present

## 2020-10-02 DIAGNOSIS — I4892 Unspecified atrial flutter: Secondary | ICD-10-CM | POA: Diagnosis not present

## 2020-10-02 DIAGNOSIS — E785 Hyperlipidemia, unspecified: Secondary | ICD-10-CM | POA: Diagnosis present

## 2020-10-02 DIAGNOSIS — J449 Chronic obstructive pulmonary disease, unspecified: Secondary | ICD-10-CM | POA: Diagnosis not present

## 2020-10-02 DIAGNOSIS — I480 Paroxysmal atrial fibrillation: Secondary | ICD-10-CM | POA: Diagnosis present

## 2020-10-02 DIAGNOSIS — Z930 Tracheostomy status: Secondary | ICD-10-CM | POA: Diagnosis not present

## 2020-10-02 DIAGNOSIS — R778 Other specified abnormalities of plasma proteins: Secondary | ICD-10-CM | POA: Diagnosis not present

## 2020-10-02 DIAGNOSIS — F17200 Nicotine dependence, unspecified, uncomplicated: Secondary | ICD-10-CM | POA: Diagnosis present

## 2020-10-02 DIAGNOSIS — Z951 Presence of aortocoronary bypass graft: Secondary | ICD-10-CM | POA: Diagnosis not present

## 2020-10-02 DIAGNOSIS — I255 Ischemic cardiomyopathy: Secondary | ICD-10-CM | POA: Diagnosis present

## 2020-10-02 DIAGNOSIS — Z86718 Personal history of other venous thrombosis and embolism: Secondary | ICD-10-CM | POA: Diagnosis not present

## 2020-10-02 DIAGNOSIS — I509 Heart failure, unspecified: Secondary | ICD-10-CM | POA: Diagnosis not present

## 2020-10-02 DIAGNOSIS — I2510 Atherosclerotic heart disease of native coronary artery without angina pectoris: Secondary | ICD-10-CM | POA: Diagnosis present

## 2020-10-02 DIAGNOSIS — I517 Cardiomegaly: Secondary | ICD-10-CM | POA: Diagnosis not present

## 2020-10-02 DIAGNOSIS — Z794 Long term (current) use of insulin: Secondary | ICD-10-CM | POA: Diagnosis not present

## 2020-10-02 DIAGNOSIS — T462X5A Adverse effect of other antidysrhythmic drugs, initial encounter: Secondary | ICD-10-CM | POA: Diagnosis present

## 2020-10-02 DIAGNOSIS — N1832 Chronic kidney disease, stage 3b: Secondary | ICD-10-CM | POA: Diagnosis not present

## 2020-10-02 DIAGNOSIS — R0602 Shortness of breath: Secondary | ICD-10-CM | POA: Diagnosis not present

## 2020-10-02 DIAGNOSIS — J90 Pleural effusion, not elsewhere classified: Secondary | ICD-10-CM | POA: Diagnosis not present

## 2020-10-02 DIAGNOSIS — Z20822 Contact with and (suspected) exposure to covid-19: Secondary | ICD-10-CM | POA: Diagnosis present

## 2020-10-02 DIAGNOSIS — E119 Type 2 diabetes mellitus without complications: Secondary | ICD-10-CM | POA: Diagnosis not present

## 2020-10-02 DIAGNOSIS — E1122 Type 2 diabetes mellitus with diabetic chronic kidney disease: Secondary | ICD-10-CM | POA: Diagnosis not present

## 2020-10-02 DIAGNOSIS — E1140 Type 2 diabetes mellitus with diabetic neuropathy, unspecified: Secondary | ICD-10-CM | POA: Diagnosis not present

## 2020-10-02 DIAGNOSIS — I5033 Acute on chronic diastolic (congestive) heart failure: Secondary | ICD-10-CM | POA: Diagnosis not present

## 2020-10-02 DIAGNOSIS — I5023 Acute on chronic systolic (congestive) heart failure: Secondary | ICD-10-CM | POA: Diagnosis present

## 2020-10-02 DIAGNOSIS — Z7952 Long term (current) use of systemic steroids: Secondary | ICD-10-CM | POA: Diagnosis not present

## 2020-10-02 DIAGNOSIS — I110 Hypertensive heart disease with heart failure: Secondary | ICD-10-CM | POA: Diagnosis not present

## 2020-10-02 DIAGNOSIS — Z6841 Body Mass Index (BMI) 40.0 and over, adult: Secondary | ICD-10-CM | POA: Diagnosis not present

## 2020-10-06 DIAGNOSIS — E119 Type 2 diabetes mellitus without complications: Secondary | ICD-10-CM | POA: Diagnosis not present

## 2020-10-09 DIAGNOSIS — J811 Chronic pulmonary edema: Secondary | ICD-10-CM | POA: Diagnosis not present

## 2020-10-09 DIAGNOSIS — J90 Pleural effusion, not elsewhere classified: Secondary | ICD-10-CM | POA: Diagnosis not present

## 2020-10-09 DIAGNOSIS — R0602 Shortness of breath: Secondary | ICD-10-CM | POA: Diagnosis not present

## 2020-10-12 DIAGNOSIS — Z9981 Dependence on supplemental oxygen: Secondary | ICD-10-CM | POA: Diagnosis not present

## 2020-10-12 DIAGNOSIS — Z794 Long term (current) use of insulin: Secondary | ICD-10-CM | POA: Diagnosis not present

## 2020-10-12 DIAGNOSIS — I82409 Acute embolism and thrombosis of unspecified deep veins of unspecified lower extremity: Secondary | ICD-10-CM | POA: Diagnosis not present

## 2020-10-12 DIAGNOSIS — Z951 Presence of aortocoronary bypass graft: Secondary | ICD-10-CM | POA: Diagnosis not present

## 2020-10-12 DIAGNOSIS — K219 Gastro-esophageal reflux disease without esophagitis: Secondary | ICD-10-CM | POA: Diagnosis not present

## 2020-10-12 DIAGNOSIS — I110 Hypertensive heart disease with heart failure: Secondary | ICD-10-CM | POA: Diagnosis not present

## 2020-10-12 DIAGNOSIS — Z7984 Long term (current) use of oral hypoglycemic drugs: Secondary | ICD-10-CM | POA: Diagnosis not present

## 2020-10-12 DIAGNOSIS — J449 Chronic obstructive pulmonary disease, unspecified: Secondary | ICD-10-CM | POA: Diagnosis not present

## 2020-10-12 DIAGNOSIS — I509 Heart failure, unspecified: Secondary | ICD-10-CM | POA: Diagnosis not present

## 2020-10-12 DIAGNOSIS — F419 Anxiety disorder, unspecified: Secondary | ICD-10-CM | POA: Diagnosis not present

## 2020-10-12 DIAGNOSIS — F17210 Nicotine dependence, cigarettes, uncomplicated: Secondary | ICD-10-CM | POA: Diagnosis not present

## 2020-10-12 DIAGNOSIS — E032 Hypothyroidism due to medicaments and other exogenous substances: Secondary | ICD-10-CM | POA: Diagnosis not present

## 2020-10-12 DIAGNOSIS — T462X5D Adverse effect of other antidysrhythmic drugs, subsequent encounter: Secondary | ICD-10-CM | POA: Diagnosis not present

## 2020-10-12 DIAGNOSIS — E1140 Type 2 diabetes mellitus with diabetic neuropathy, unspecified: Secondary | ICD-10-CM | POA: Diagnosis not present

## 2020-10-13 DIAGNOSIS — E1140 Type 2 diabetes mellitus with diabetic neuropathy, unspecified: Secondary | ICD-10-CM | POA: Diagnosis not present

## 2020-10-13 DIAGNOSIS — K219 Gastro-esophageal reflux disease without esophagitis: Secondary | ICD-10-CM | POA: Diagnosis not present

## 2020-10-13 DIAGNOSIS — J449 Chronic obstructive pulmonary disease, unspecified: Secondary | ICD-10-CM | POA: Diagnosis not present

## 2020-10-13 DIAGNOSIS — I509 Heart failure, unspecified: Secondary | ICD-10-CM | POA: Diagnosis not present

## 2020-10-13 DIAGNOSIS — F17210 Nicotine dependence, cigarettes, uncomplicated: Secondary | ICD-10-CM | POA: Diagnosis not present

## 2020-10-13 DIAGNOSIS — I110 Hypertensive heart disease with heart failure: Secondary | ICD-10-CM | POA: Diagnosis not present

## 2020-10-15 DIAGNOSIS — E1140 Type 2 diabetes mellitus with diabetic neuropathy, unspecified: Secondary | ICD-10-CM | POA: Diagnosis not present

## 2020-10-15 DIAGNOSIS — K219 Gastro-esophageal reflux disease without esophagitis: Secondary | ICD-10-CM | POA: Diagnosis not present

## 2020-10-15 DIAGNOSIS — I509 Heart failure, unspecified: Secondary | ICD-10-CM | POA: Diagnosis not present

## 2020-10-15 DIAGNOSIS — F17210 Nicotine dependence, cigarettes, uncomplicated: Secondary | ICD-10-CM | POA: Diagnosis not present

## 2020-10-15 DIAGNOSIS — I110 Hypertensive heart disease with heart failure: Secondary | ICD-10-CM | POA: Diagnosis not present

## 2020-10-15 DIAGNOSIS — J449 Chronic obstructive pulmonary disease, unspecified: Secondary | ICD-10-CM | POA: Diagnosis not present

## 2020-10-16 DIAGNOSIS — E1140 Type 2 diabetes mellitus with diabetic neuropathy, unspecified: Secondary | ICD-10-CM | POA: Diagnosis not present

## 2020-10-16 DIAGNOSIS — I509 Heart failure, unspecified: Secondary | ICD-10-CM | POA: Diagnosis not present

## 2020-10-16 DIAGNOSIS — I5042 Chronic combined systolic (congestive) and diastolic (congestive) heart failure: Secondary | ICD-10-CM | POA: Diagnosis not present

## 2020-10-16 DIAGNOSIS — I110 Hypertensive heart disease with heart failure: Secondary | ICD-10-CM | POA: Diagnosis not present

## 2020-10-16 DIAGNOSIS — K219 Gastro-esophageal reflux disease without esophagitis: Secondary | ICD-10-CM | POA: Diagnosis not present

## 2020-10-16 DIAGNOSIS — E039 Hypothyroidism, unspecified: Secondary | ICD-10-CM | POA: Diagnosis not present

## 2020-10-16 DIAGNOSIS — J449 Chronic obstructive pulmonary disease, unspecified: Secondary | ICD-10-CM | POA: Diagnosis not present

## 2020-10-16 DIAGNOSIS — Z299 Encounter for prophylactic measures, unspecified: Secondary | ICD-10-CM | POA: Diagnosis not present

## 2020-10-16 DIAGNOSIS — Z930 Tracheostomy status: Secondary | ICD-10-CM | POA: Diagnosis not present

## 2020-10-16 DIAGNOSIS — I82409 Acute embolism and thrombosis of unspecified deep veins of unspecified lower extremity: Secondary | ICD-10-CM | POA: Diagnosis not present

## 2020-10-16 DIAGNOSIS — F17210 Nicotine dependence, cigarettes, uncomplicated: Secondary | ICD-10-CM | POA: Diagnosis not present

## 2020-10-16 DIAGNOSIS — Z6841 Body Mass Index (BMI) 40.0 and over, adult: Secondary | ICD-10-CM | POA: Diagnosis not present

## 2020-10-20 DIAGNOSIS — I110 Hypertensive heart disease with heart failure: Secondary | ICD-10-CM | POA: Diagnosis not present

## 2020-10-20 DIAGNOSIS — I509 Heart failure, unspecified: Secondary | ICD-10-CM | POA: Diagnosis not present

## 2020-10-20 DIAGNOSIS — E1140 Type 2 diabetes mellitus with diabetic neuropathy, unspecified: Secondary | ICD-10-CM | POA: Diagnosis not present

## 2020-10-20 DIAGNOSIS — F17210 Nicotine dependence, cigarettes, uncomplicated: Secondary | ICD-10-CM | POA: Diagnosis not present

## 2020-10-20 DIAGNOSIS — K219 Gastro-esophageal reflux disease without esophagitis: Secondary | ICD-10-CM | POA: Diagnosis not present

## 2020-10-20 DIAGNOSIS — J449 Chronic obstructive pulmonary disease, unspecified: Secondary | ICD-10-CM | POA: Diagnosis not present

## 2020-10-22 DIAGNOSIS — I509 Heart failure, unspecified: Secondary | ICD-10-CM | POA: Diagnosis not present

## 2020-10-22 DIAGNOSIS — F17210 Nicotine dependence, cigarettes, uncomplicated: Secondary | ICD-10-CM | POA: Diagnosis not present

## 2020-10-22 DIAGNOSIS — J449 Chronic obstructive pulmonary disease, unspecified: Secondary | ICD-10-CM | POA: Diagnosis not present

## 2020-10-22 DIAGNOSIS — K219 Gastro-esophageal reflux disease without esophagitis: Secondary | ICD-10-CM | POA: Diagnosis not present

## 2020-10-22 DIAGNOSIS — I110 Hypertensive heart disease with heart failure: Secondary | ICD-10-CM | POA: Diagnosis not present

## 2020-10-22 DIAGNOSIS — E1140 Type 2 diabetes mellitus with diabetic neuropathy, unspecified: Secondary | ICD-10-CM | POA: Diagnosis not present

## 2020-10-24 DIAGNOSIS — K219 Gastro-esophageal reflux disease without esophagitis: Secondary | ICD-10-CM | POA: Diagnosis not present

## 2020-10-24 DIAGNOSIS — I509 Heart failure, unspecified: Secondary | ICD-10-CM | POA: Diagnosis not present

## 2020-10-24 DIAGNOSIS — I110 Hypertensive heart disease with heart failure: Secondary | ICD-10-CM | POA: Diagnosis not present

## 2020-10-24 DIAGNOSIS — F17210 Nicotine dependence, cigarettes, uncomplicated: Secondary | ICD-10-CM | POA: Diagnosis not present

## 2020-10-24 DIAGNOSIS — J449 Chronic obstructive pulmonary disease, unspecified: Secondary | ICD-10-CM | POA: Diagnosis not present

## 2020-10-24 DIAGNOSIS — E1140 Type 2 diabetes mellitus with diabetic neuropathy, unspecified: Secondary | ICD-10-CM | POA: Diagnosis not present

## 2020-10-26 NOTE — Progress Notes (Signed)
Cardiology Office Note   Date:  10/29/2020   ID:  Cherrish, Vitali [birth year], MRN [id number]  PCP:  Monico Blitz, MD  Cardiologist:   [name], MD   Chief Complaint  Patient presents with  . Leg Swelling      History of Present Illness: Theresa [name] is a 55 y.o. female who presents for follow up of diastolic HF.  She has a history of two-vessel CABG with stenting prior to her CABG performed in [place] with CABG performed in [place] ([place] at [facility] in 2007.  She was hospitalized in May 2017 for acute on chronic hypoxic respiratory failure multifactorial in etiology due to acute on chronic diastolic heart failure, pneumonia, and COPD exacerbation.   She was hospitalized for an acute inferior STEMI in February 2017 and underwent drug-eluting stent placement in the distal SVG to the PDA.  She also has a history of diabetes, hypertension, hyperlipidemia, lupus anticoagulant with DVT and is on chronic anticoagulant therapy.  July 2019 she had chest pain.    She was managed medically.  She had a mean pulmonary pressure of 40.  She had acute on chronic HF with an EF of 50%.   She had PEA arrest due to dislodged tracheostomy in 10/2018.    She has been admitted to the hospital several times since I saw her.  The last time was in [facility]  I reviewed these records for this visit.  She was volume overloaded and diuresed.   I note that during that visit she was taken off of amiodarone.   This was apparently done because she had hypothyroid.  Her Torsemide was changed to 60 mg daily.  Since going home she is not taking 60 mg twice daily.  Unfortunately her weight continues to go up.  I do believe she is good about her salt.  She weighs herself daily.  She tries not to eat salty foods.  Her dry weight is probably around 207 pounds and she is 222.  She started having increasing edema going up her legs.  She starting to have a little "gurgling" at night.  She is not  describing new PND or orthopnea.  She is not having any new chest pressure, neck or arm discomfort.  She is not having any palpitations, presyncope or syncope.  She is chronically on 4 or 5 L of oxygen.    Past Medical History:  Diagnosis Date  . Acute kidney failure with lesion of tubular necrosis (HCC)   . Acute systolic heart failure ([place])   . CAD (coronary artery disease)    a. s/p prior PCI. b. CABG 2007 at [facility] in [place] 2007. c. inferior STEMI 10/2015 s/p DES to dSVG-PDA.  . Cardiac arrest ([place])   . Cervical cancer ([place])   . Chronic diastolic CHF (congestive heart failure) ([place])   . Chronic respiratory failure ([place])    s/p tracheostomy 2002  . Chronic RUQ pain   . COPD (chronic obstructive pulmonary disease) ([place])   . Diabetes mellitus ([place])   . DVT (deep venous thrombosis) ([place])   . Endometriosis   . History of gallstones 01/2016   seen on Ultrasound  . History of HIDA scan 11/2016   normal  . HTN (hypertension)   . Hyperlipidemia   . Lupus anticoagulant disorder (HCC)    on coumadin  . Morbid obesity ([place])   . Psoriasis   . ST elevation (STEMI) myocardial infarction involving  right coronary artery ([place]) 10/29/15   stent to VG to PDA  . Toe fracture, right 03/29/2018  . Tracheostomy in place [facility]), chronic since 2002 11/03/2015    Past Surgical History:  Procedure Laterality Date  . CARDIAC CATHETERIZATION N/A 10/29/2015   Procedure: Left Heart Cath and Cors/Grafts Angiography;  Surgeon: Burnell Blanks, MD;  Location: [place] CV LAB;  Service: Cardiovascular;  Laterality: N/A;  . CARDIAC CATHETERIZATION  10/29/2015   Procedure: Coronary Stent Intervention;  Surgeon: Burnell Blanks, MD;  Location: [place] CV LAB;  Service: Cardiovascular;;  . CAROTID STENT    . CESAREAN SECTION WITH BILATERAL TUBAL LIGATION    . CORONARY ARTERY BYPASS GRAFT  2007   2V  . IR GASTROSTOMY TUBE MOD SED  11/13/2018  . RIGHT/LEFT HEART CATH AND  CORONARY/GRAFT ANGIOGRAPHY N/A 03/24/2018   Procedure: RIGHT/LEFT HEART CATH AND CORONARY/GRAFT ANGIOGRAPHY;  Surgeon: Belva Crome, MD;  Location: [place] [facility] LAB;  Service: Cardiovascular;  Laterality: N/A;  . TRACHEOSTOMY       Current Outpatient Medications  Medication Sig Dispense Refill  . albuterol (PROVENTIL) (2.5 MG/3ML) 0.083% nebulizer solution Take 3 mLs (2.5 mg total) by nebulization every 4 (four) hours as needed for wheezing.    Marland Kitchen albuterol (VENTOLIN HFA) 108 (90 Base) MCG/ACT inhaler Inhale 2 puffs into the lungs every 4 (four) hours as needed for wheezing or shortness of breath.    Marland Kitchen atorvastatin (LIPITOR) 20 MG tablet Take 20 mg by mouth daily.    . bumetanide (BUMEX) 2 MG tablet Take 1 tablet (2 mg total) by mouth daily. Takes 6 mg am and 4 mg pm 180 tablet 1  . fluticasone furoate-vilanterol (BREO ELLIPTA) 100-25 MCG/INH AEPB Inhale 1 puff into the lungs daily. 28 each 1  . gabapentin (NEURONTIN) 300 MG capsule Take 3 capsules (900 mg total) by mouth at bedtime.    . hydrocortisone (ANUSOL-HC) 25 MG suppository Place 1 suppository (25 mg total) rectally 2 (two) times daily. 12 suppository 0  . insulin degludec (TRESIBA FLEXTOUCH) 100 UNIT/ML FlexTouch Pen Inject 30 Units into the skin at bedtime.    Marland Kitchen levothyroxine (SYNTHROID) 50 MCG tablet Take 1 tablet by mouth daily.    . metolazone (ZAROXOLYN) 5 MG tablet Take 1 tablet (5 mg total) by mouth daily. 30 tablet 0  . mometasone (ELOCON) 0.1 % ointment Apply 1 application topically daily as needed.    . nitroGLYCERIN (NITROSTAT) 0.4 MG SL tablet Place 0.4 mg under the tongue every 5 (five) minutes as needed for chest pain.    Marland Kitchen NOVOLOG FLEXPEN 100 UNIT/ML FlexPen Inject 1-10 Units into the skin 3 (three) times daily before meals.    Marland Kitchen OZEMPIC, 0.25 OR 0.5 MG/DOSE, 2 MG/1.5ML SOPN Inject 0.5 mg into the skin every Wednesday.     . pantoprazole (PROTONIX) 20 MG tablet Take 20 mg by mouth daily.    . polyethylene glycol  (MIRALAX / GLYCOLAX) 17 g packet Take 17 g by mouth daily as needed for mild constipation. 14 each 0  . predniSONE (DELTASONE) 10 MG tablet Take 4 tabs for 3 days, then 3 tabs for 3 days, then 2 tabs for 3 days, then 1 tab for 3 days, then 1/2 tab for 4 days. 32 tablet 0  . Pseudoeph-Doxylamine-DM-APAP (NYQUIL PO) Take 20 mLs by mouth daily.    . sertraline (ZOLOFT) 50 MG tablet Take 50 mg by mouth at bedtime.     . tamsulosin (FLOMAX) 0.4 MG  CAPS capsule Take 0.4 mg by mouth daily.    Marland Kitchen torsemide (DEMADEX) 20 MG tablet Take 20 mg by mouth daily. 3 tablets in the morning and 3 tablets at bedtime.    . traMADol (ULTRAM) 50 MG tablet Take 1 tablet (50 mg total) by mouth 3 (three) times daily as needed for moderate pain. 30 tablet 0  . warfarin (COUMADIN) 2.5 MG tablet Take 0.5-1 tablets (1.25-2.5 mg total) by mouth See admin instructions. 2.5mg  MWF and 1.25mg  every other day.    . potassium chloride SA (KLOR-CON) 20 MEQ tablet Take 2 tablets (40 mEq total) by mouth daily. 60 tablet 11   No current facility-administered medications for this visit.    Allergies:   Penicillins, Ciprofloxacin, and Ibuprofen    ROS:  Please see the history of present illness.   Otherwise, review of systems are positive for none.   All other systems are reviewed and negative.    PHYSICAL EXAM: VS:  BP 119/74   Pulse 72   Ht 4\' 10"  (1.473 m)   Wt 222 lb 9.6 oz (101 kg)   SpO2 95%   BMI 46.52 kg/m  , BMI Body mass index is 46.52 kg/m. GEN:  No distress NECK:  No jugular venous distention at 90 degrees, waveform within normal limits, carotid upstroke brisk and symmetric, no bruits, no thyromegaly, tracheostomy in place LYMPHATICS:  No cervical adenopathy LUNGS:  Clear to auscultation bilaterally BACK:  No CVA tenderness CHEST:  Unremarkable HEART:  S1 and S2 within normal limits, no S3, no S4, no clicks, no rubs, no murmurs ABD:  Positive bowel sounds normal in frequency in pitch, no bruits, no rebound, no  guarding, unable to assess midline mass or bruit with the patient seated.,  Morbidly obese EXT:  2 plus pulses throughout, moderate edema to the knees bilaterally, no cyanosis no clubbing SKIN:  No rashes no nodules   EKG:  EKG is  ordered today. Sinus rhythm, rate 82, poor anterior R wave progression, QTC slightly prolonged, nonspecific lateral T wave changes not different from previous.  Compared to previous she is not in atrial fibrillation.   Recent Labs: 08/01/2020: B Natriuretic Peptide 344.0; TSH 3.750 08/03/2020: ALT 25 08/05/2020: Magnesium 2.7 08/07/2020: BUN 58; Creatinine, Ser 1.20; Hemoglobin 15.2; Platelets 285; Potassium 3.0; Sodium 136    Lipid Panel    Component Value Date/Time   CHOL 221 (H) 05/05/2020 0928   TRIG 165 (H) 05/05/2020 0928   HDL 71 05/05/2020 0928   CHOLHDL 3.1 05/05/2020 0928   CHOLHDL 3.8 03/24/2018 0451   VLDL 20 03/24/2018 0451   LDLCALC 121 (H) 05/05/2020 0928      Wt Readings from Last 3 Encounters:  10/28/20 222 lb 9.6 oz (101 kg)  08/04/20 205 lb 14.6 oz (93.4 kg)  07/29/20 205 lb 11 oz (93.3 kg)      Other studies Reviewed: Additional studies/ records that were reviewed today include: Extensive review of multiple [facility] records. Review of the above records demonstrates: See elsewhere   ASSESSMENT AND PLAN:  Chronic Mixed CHF:   Today she needs Zaroxolyn 5 mg for 2 days only.  She is going to double her potassium.  I am going to order a Bumex to be run on Friday.  She knows to keep her feet elevated.  We are going to see her back in clinic in 2 weeks.  If she gets worse rather than better she needs to present again to the emergency room.  Hypertension:    Her blood pressure is controlled in the context of managing her cardiomyopathy.  CAD: Hx of CABG.    She has been having no ongoing chest discomfort.  No change in therapy.  Hx of DVT with Lupus Anticoagulant Syndrome:    She tolerates anticoagulation.  No change in  therapy.  Atrial fib: She has no symptomatic paroxysms.  No change in therapy.  She remains off of the amiodarone because of hypothyroidism and is currently on thyroid replacement therapy.   Current medicines are reviewed at length with the patient today.  The patient does not have concerns regarding medicines.  The following changes have been made:  As above  Labs/ tests ordered today include:   Orders Placed This Encounter  Procedures  . Basic metabolic panel  . EKG 12-Lead     Disposition:   FU with me in 12 months.     Signed, [name], MD  10/29/2020 10:32 AM    [facility]

## 2020-10-27 DIAGNOSIS — I110 Hypertensive heart disease with heart failure: Secondary | ICD-10-CM | POA: Diagnosis not present

## 2020-10-27 DIAGNOSIS — K219 Gastro-esophageal reflux disease without esophagitis: Secondary | ICD-10-CM | POA: Diagnosis not present

## 2020-10-27 DIAGNOSIS — F17210 Nicotine dependence, cigarettes, uncomplicated: Secondary | ICD-10-CM | POA: Diagnosis not present

## 2020-10-27 DIAGNOSIS — I509 Heart failure, unspecified: Secondary | ICD-10-CM | POA: Diagnosis not present

## 2020-10-27 DIAGNOSIS — E1140 Type 2 diabetes mellitus with diabetic neuropathy, unspecified: Secondary | ICD-10-CM | POA: Diagnosis not present

## 2020-10-27 DIAGNOSIS — J449 Chronic obstructive pulmonary disease, unspecified: Secondary | ICD-10-CM | POA: Diagnosis not present

## 2020-10-28 ENCOUNTER — Other Ambulatory Visit: Payer: Self-pay

## 2020-10-28 ENCOUNTER — Ambulatory Visit (INDEPENDENT_AMBULATORY_CARE_PROVIDER_SITE_OTHER): Payer: Medicare Other | Admitting: Cardiology

## 2020-10-28 VITALS — BP 119/74 | HR 72 | Ht <= 58 in | Wt 222.6 lb

## 2020-10-28 DIAGNOSIS — Z79899 Other long term (current) drug therapy: Secondary | ICD-10-CM

## 2020-10-28 DIAGNOSIS — I5043 Acute on chronic combined systolic (congestive) and diastolic (congestive) heart failure: Secondary | ICD-10-CM | POA: Diagnosis not present

## 2020-10-28 DIAGNOSIS — I10 Essential (primary) hypertension: Secondary | ICD-10-CM | POA: Diagnosis not present

## 2020-10-28 DIAGNOSIS — I2510 Atherosclerotic heart disease of native coronary artery without angina pectoris: Secondary | ICD-10-CM

## 2020-10-28 MED ORDER — POTASSIUM CHLORIDE CRYS ER 20 MEQ PO TBCR: 40.0000 meq | EXTENDED_RELEASE_TABLET | Freq: Every day | ORAL | 11 refills | Status: DC

## 2020-10-28 MED ORDER — METOLAZONE 5 MG PO TABS: 5.0000 mg | ORAL_TABLET | Freq: Every day | ORAL | 0 refills | Status: DC

## 2020-10-28 NOTE — Patient Instructions (Signed)
Medication Instructions:  Start Zaroxolyn 5mg . Take 1 tablet 30 minutes before your torsemide daily for 2 days.  Increase potassium to 39meq daily *If you need a refill on your cardiac medications before your next appointment, please call your pharmacy*  Lab Work: Your physician recommends that you return for lab work Friday in [place]: BMP If you have labs (blood work) drawn today and your tests are completely normal, you will receive your results only by: Marland Kitchen MyChart Message (if you have MyChart) OR . A paper copy in the mail If you have any lab test that is abnormal or we need to change your treatment, we will call you to review the results.  Follow-Up: At [facility], you and your health needs are our priority.  As part of our continuing mission to provide you with exceptional heart care, we have created designated Provider Care Teams.  These Care Teams include your primary Cardiologist (physician) and Advanced Practice Providers (APPs -  Physician Assistants and Nurse Practitioners) who all work together to provide you with the care you need, when you need it.    Your next appointment:   2 week(s)  The format for your next appointment:   In Person  Provider:   Katina Dung, NP

## 2020-10-29 ENCOUNTER — Encounter: Payer: Self-pay | Admitting: Cardiology

## 2020-10-29 DIAGNOSIS — F17210 Nicotine dependence, cigarettes, uncomplicated: Secondary | ICD-10-CM | POA: Diagnosis not present

## 2020-10-29 DIAGNOSIS — E1140 Type 2 diabetes mellitus with diabetic neuropathy, unspecified: Secondary | ICD-10-CM | POA: Diagnosis not present

## 2020-10-29 DIAGNOSIS — J449 Chronic obstructive pulmonary disease, unspecified: Secondary | ICD-10-CM | POA: Diagnosis not present

## 2020-10-29 DIAGNOSIS — I509 Heart failure, unspecified: Secondary | ICD-10-CM | POA: Diagnosis not present

## 2020-10-29 DIAGNOSIS — K219 Gastro-esophageal reflux disease without esophagitis: Secondary | ICD-10-CM | POA: Diagnosis not present

## 2020-10-29 DIAGNOSIS — I110 Hypertensive heart disease with heart failure: Secondary | ICD-10-CM | POA: Diagnosis not present

## 2020-10-30 DIAGNOSIS — I509 Heart failure, unspecified: Secondary | ICD-10-CM | POA: Diagnosis not present

## 2020-10-30 DIAGNOSIS — F17210 Nicotine dependence, cigarettes, uncomplicated: Secondary | ICD-10-CM | POA: Diagnosis not present

## 2020-10-30 DIAGNOSIS — K219 Gastro-esophageal reflux disease without esophagitis: Secondary | ICD-10-CM | POA: Diagnosis not present

## 2020-10-30 DIAGNOSIS — J449 Chronic obstructive pulmonary disease, unspecified: Secondary | ICD-10-CM | POA: Diagnosis not present

## 2020-10-30 DIAGNOSIS — E1140 Type 2 diabetes mellitus with diabetic neuropathy, unspecified: Secondary | ICD-10-CM | POA: Diagnosis not present

## 2020-10-30 DIAGNOSIS — I110 Hypertensive heart disease with heart failure: Secondary | ICD-10-CM | POA: Diagnosis not present

## 2020-10-31 DIAGNOSIS — F17210 Nicotine dependence, cigarettes, uncomplicated: Secondary | ICD-10-CM | POA: Diagnosis not present

## 2020-10-31 DIAGNOSIS — I110 Hypertensive heart disease with heart failure: Secondary | ICD-10-CM | POA: Diagnosis not present

## 2020-10-31 DIAGNOSIS — K219 Gastro-esophageal reflux disease without esophagitis: Secondary | ICD-10-CM | POA: Diagnosis not present

## 2020-10-31 DIAGNOSIS — I509 Heart failure, unspecified: Secondary | ICD-10-CM | POA: Diagnosis not present

## 2020-10-31 DIAGNOSIS — J449 Chronic obstructive pulmonary disease, unspecified: Secondary | ICD-10-CM | POA: Diagnosis not present

## 2020-10-31 DIAGNOSIS — E1140 Type 2 diabetes mellitus with diabetic neuropathy, unspecified: Secondary | ICD-10-CM | POA: Diagnosis not present

## 2020-11-01 DIAGNOSIS — I509 Heart failure, unspecified: Secondary | ICD-10-CM | POA: Diagnosis not present

## 2020-11-01 DIAGNOSIS — F17210 Nicotine dependence, cigarettes, uncomplicated: Secondary | ICD-10-CM | POA: Diagnosis not present

## 2020-11-01 DIAGNOSIS — Z79899 Other long term (current) drug therapy: Secondary | ICD-10-CM | POA: Diagnosis not present

## 2020-11-01 DIAGNOSIS — E1140 Type 2 diabetes mellitus with diabetic neuropathy, unspecified: Secondary | ICD-10-CM | POA: Diagnosis not present

## 2020-11-01 DIAGNOSIS — J449 Chronic obstructive pulmonary disease, unspecified: Secondary | ICD-10-CM | POA: Diagnosis not present

## 2020-11-01 DIAGNOSIS — I5043 Acute on chronic combined systolic (congestive) and diastolic (congestive) heart failure: Secondary | ICD-10-CM | POA: Diagnosis not present

## 2020-11-01 DIAGNOSIS — I110 Hypertensive heart disease with heart failure: Secondary | ICD-10-CM | POA: Diagnosis not present

## 2020-11-01 DIAGNOSIS — K219 Gastro-esophageal reflux disease without esophagitis: Secondary | ICD-10-CM | POA: Diagnosis not present

## 2020-11-03 ENCOUNTER — Telehealth: Payer: Self-pay

## 2020-11-03 DIAGNOSIS — E119 Type 2 diabetes mellitus without complications: Secondary | ICD-10-CM | POA: Diagnosis not present

## 2020-11-03 NOTE — Telephone Encounter (Signed)
TSH (11/01/2020 5:45 PM EST) TSH (11/01/2020 5:45 PM EST)  Component Value Ref Range Performed At Pathologist Signature  TSH 4.349 0.550 - 4.780 uIU/mL [place]    TSH (11/01/2020 5:45 PM EST)  Specimen  Blood   TSH (11/01/2020 5:45 PM EST)  Performing Organization Address City/State/ZIP Code Phone Number  [place]  [place], [place] [address]  [phone]    Back to top of Lab Results    Basic Metabolic Panel (01/05/5614 5:45 PM EST) Basic Metabolic Panel (48/84/5733 5:45 PM EST)  Component Value Ref Range Performed At Pathologist Signature  Sodium 133 (L) 135 - 145 mmol/L [place]   Potassium 3.0 (L) 3.5 - 5.0 mmol/L [facility]   Chloride 85 (L) 98 - 107 mmol/L [facility]   CO2 42.5 (H) 21.0 - 32.0 mmol/L [facility]   Anion Gap 6 3 - 11 mmol/L [place] [facility]   BUN 35 (H) 8 - 20 mg/dL [facility]   Creatinine 1.58 (H) 0.60 - 1.10 mg/dL [place]   BUN/Creatinine Ratio [place], Female 37 mL/min/1.34m [place] Female 42 mL/min/1.729m[place]is[place]ORATORY   Glucose 406 (HH) 70 - 179 mg/dL [place] Calcium 9.5 8.5 - 10.1 mg/dL [place]

## 2020-11-04 ENCOUNTER — Other Ambulatory Visit: Payer: Self-pay

## 2020-11-04 DIAGNOSIS — Z79899 Other long term (current) drug therapy: Secondary | ICD-10-CM

## 2020-11-04 NOTE — Progress Notes (Signed)
Patient to take an additional 84meq of potassium today and tomorrow with a repeat BMP on Friday. Spoke with patient and relayed instructions. Patient has a home health nurse through [facility] who draws her blood. Message left for Stephanie at [phone] to call back for lab orders.

## 2020-11-05 DIAGNOSIS — E039 Hypothyroidism, unspecified: Secondary | ICD-10-CM | POA: Diagnosis not present

## 2020-11-05 DIAGNOSIS — E1140 Type 2 diabetes mellitus with diabetic neuropathy, unspecified: Secondary | ICD-10-CM | POA: Diagnosis not present

## 2020-11-05 DIAGNOSIS — Z299 Encounter for prophylactic measures, unspecified: Secondary | ICD-10-CM | POA: Diagnosis not present

## 2020-11-05 DIAGNOSIS — Z6841 Body Mass Index (BMI) 40.0 and over, adult: Secondary | ICD-10-CM | POA: Diagnosis not present

## 2020-11-05 DIAGNOSIS — I110 Hypertensive heart disease with heart failure: Secondary | ICD-10-CM | POA: Diagnosis not present

## 2020-11-05 DIAGNOSIS — F17210 Nicotine dependence, cigarettes, uncomplicated: Secondary | ICD-10-CM | POA: Diagnosis not present

## 2020-11-05 DIAGNOSIS — I80209 Phlebitis and thrombophlebitis of unspecified deep vessels of unspecified lower extremity: Secondary | ICD-10-CM | POA: Diagnosis not present

## 2020-11-05 DIAGNOSIS — I509 Heart failure, unspecified: Secondary | ICD-10-CM | POA: Diagnosis not present

## 2020-11-05 DIAGNOSIS — I10 Essential (primary) hypertension: Secondary | ICD-10-CM | POA: Diagnosis not present

## 2020-11-05 DIAGNOSIS — I480 Paroxysmal atrial fibrillation: Secondary | ICD-10-CM | POA: Diagnosis not present

## 2020-11-05 DIAGNOSIS — K219 Gastro-esophageal reflux disease without esophagitis: Secondary | ICD-10-CM | POA: Diagnosis not present

## 2020-11-05 DIAGNOSIS — E1165 Type 2 diabetes mellitus with hyperglycemia: Secondary | ICD-10-CM | POA: Diagnosis not present

## 2020-11-05 DIAGNOSIS — J449 Chronic obstructive pulmonary disease, unspecified: Secondary | ICD-10-CM | POA: Diagnosis not present

## 2020-11-06 NOTE — Telephone Encounter (Signed)
11/04/2020 - Patient to take an additional 6meq of potassium today and tomorrow with a repeat BMP on Friday. Spoke with patient and relayed instructions. Patient has a home health nurse through [facility] who draws her blood. Message left for Stephanie at [phone] to call back for lab orders.     11/06/2020 - Spoke with Colletta Maryland at [facility]. She will draw a BMP on 11/07/2020 and fax the results to Dr. Rosezella [place] office.

## 2020-11-07 DIAGNOSIS — I509 Heart failure, unspecified: Secondary | ICD-10-CM | POA: Diagnosis not present

## 2020-11-07 DIAGNOSIS — K219 Gastro-esophageal reflux disease without esophagitis: Secondary | ICD-10-CM | POA: Diagnosis not present

## 2020-11-07 DIAGNOSIS — F17210 Nicotine dependence, cigarettes, uncomplicated: Secondary | ICD-10-CM | POA: Diagnosis not present

## 2020-11-07 DIAGNOSIS — J449 Chronic obstructive pulmonary disease, unspecified: Secondary | ICD-10-CM | POA: Diagnosis not present

## 2020-11-07 DIAGNOSIS — E1140 Type 2 diabetes mellitus with diabetic neuropathy, unspecified: Secondary | ICD-10-CM | POA: Diagnosis not present

## 2020-11-07 DIAGNOSIS — I110 Hypertensive heart disease with heart failure: Secondary | ICD-10-CM | POA: Diagnosis not present

## 2020-11-10 NOTE — Progress Notes (Signed)
Cardiology Office Note  Date: 11/11/2020   ID: Theresa [name], DOB 04-27-1966, MRN [id number]  PCP:  Monico Blitz, MD  Cardiologist:  [name], MD Electrophysiologist:  None   Chief Complaint: Follow-up acute on chronic combined systolic and diastolic heart failure.  History of Present Illness: Theresa [name] is a 55 y.o. female with a history of CAD, chronic diastolic CHF, chronic respiratory failure, COPD, DM 2, HTN, HLD, lupus, morbid obesity, STEMI, cardiac arrest, acute kidney failure lesion of tubular necrosis.  Acute systolic heart failure, DVT, lupus anticoagulant syndrome, tobacco abuse, chronic respiratory failure on 4 L.  Chronic tracheostomy tracheostomy.  Last seen by Dr. Percival Spanish 10/28/2020.  The office visit.  He noted she had a recent admission to [facility] due to volume overload and was diuresed.  During that visit she was taken off amiodarone due to hypothyroid.  Torsemide was changed to 60 mg daily.  He noticed since going home she had not been taking 60 mg twice daily.  Her weight continued to increase.  He stated her dry weight was likely around 207 and was 222 on that visit.  She had been having increasing edema in her legs.  Chronically on 4 L of oxygen at home.  He ordered Zaroxolyn 5 mg for 2 days only.  She was going to double her potassium.  He ordered a basic metabolic panel to be run on Friday.  She was to keep her feet elevated.  She was to be seen back in clinic in 2 weeks.  If she became worse she was to present to the emergency room.  Blood pressure was under control in context of managing her cardiomyopathy.  No ongoing chest discomfort.  No change in therapy.  She was tolerating anticoagulation for history of DVT and lupus anticoagulant syndrome.  She remained off of amiodarone because of hypothyroidism and currently on thyroid replacement therapy.  Recent lab work 11/01/2020 with sodium 133, potassium 3.0, BUN 35, creatinine 1.58, glucose 406. There is a note  in the system on 11/04/2020 for patient to take an additional 40 mEq of potassium on that day and the following day and repeat BMP on Friday which would have been March 4.  She has not had to basic metabolic panel done as of yet.   She is here today for 2-week follow-up.  She has lost approximately 4 pounds.  Blood pressure is doing well at 118/62.  Heart rate is 61.  Denies any significant shortness of breath or DOE.  She has chronic lower extremity edema.  She denies any palpitations or arrhythmias.  No anginal symptoms or use of nitroglycerin.  She is on chronic O2 and has a permanent trach.  Blood sugar has been uncontrolled.  Recent glucose on 11/01/2020 was 406.  She is using a walker to ambulate.  She is here with her husband.  O2 saturation on 4 L O2 is 91%.  She has chronic dyspnea.  She takes metolazone twice per week on Tuesdays and Wednesdays 5 mg.  Past Medical History:  Diagnosis Date  . Acute kidney failure with lesion of tubular necrosis (HCC)   . Acute systolic heart failure ([place])   . CAD (coronary artery disease)    a. s/p prior PCI. b. CABG 2007 at [facility] in [place] 2007. c. inferior STEMI 10/2015 s/p DES to dSVG-PDA.  . Cardiac arrest ([place])   . Cervical cancer ([place])   . Chronic diastolic CHF (congestive heart failure) ([place])   .  Chronic respiratory failure ([place])    s/p tracheostomy 2002  . Chronic RUQ pain   . COPD (chronic obstructive pulmonary disease) ([place])   . Diabetes mellitus ([place])   . DVT (deep venous thrombosis) ([place])   . Endometriosis   . History of gallstones 01/2016   seen on Ultrasound  . History of HIDA scan 11/2016   normal  . HTN (hypertension)   . Hyperlipidemia   . Lupus anticoagulant disorder (HCC)    on coumadin  . Morbid obesity ([place])   . Psoriasis   . ST elevation (STEMI) myocardial infarction involving right coronary artery ([place]) 10/29/15   stent to VG to PDA  . Toe fracture, right 03/29/2018  . Tracheostomy in place [facility]), chronic  since 2002 11/03/2015    Past Surgical History:  Procedure Laterality Date  . CARDIAC CATHETERIZATION N/A 10/29/2015   Procedure: Left Heart Cath and Cors/Grafts Angiography;  Surgeon: Burnell Blanks, MD;  Location: [facility];  Service: Cardiovascular;  Laterality: N/A;  . CARDIAC CATHETERIZATION  10/29/2015   Procedure: Coronary Stent Intervention;  Surgeon: Burnell Blanks, MD;  Location: [place];  Service: Cardiovascular;;  . CAROTID STENT    . CESAREAN SECTION WITH BILATERAL TUBAL LIGATION    . CORONARY ARTERY BYPASS GRAFT  2007   2V  . IR GASTROSTOMY TUBE MOD SED  11/13/2018  . RIGHT/LEFT HEART CATH AND CORONARY/GRAFT ANGIOGRAPHY N/A 03/24/2018   Procedure: RIGHT/LEFT HEART CATH AND CORONARY/GRAFT ANGIOGRAPHY;  Surgeon: Belva Crome, MD;  Location: [place] [facility];  Service: Cardiovascular;  Laterality: N/A;  . TRACHEOSTOMY      Current Outpatient Medications  Medication Sig Dispense Refill  . albuterol (PROVENTIL) (2.5 MG/3ML) 0.083% nebulizer solution Take 3 mLs (2.5 mg total) by nebulization every 4 (four) hours as needed for wheezing.    Marland Kitchen albuterol (VENTOLIN HFA) 108 (90 Base) MCG/ACT inhaler Inhale 2 puffs into the lungs every 4 (four) hours as needed for wheezing or shortness of breath.    Marland Kitchen atorvastatin (LIPITOR) 20 MG tablet Take 20 mg by mouth daily.    . digoxin (LANOXIN) 0.125 MG tablet Take 125 mcg by mouth daily. Started 10/31/2020    . gabapentin (NEURONTIN) 300 MG capsule Take 3 capsules (900 mg total) by mouth at bedtime.    . insulin degludec (TRESIBA FLEXTOUCH) 100 UNIT/ML FlexTouch Pen Inject 30 Units into the skin at bedtime.    Marland Kitchen levothyroxine (SYNTHROID) 50 MCG tablet Take 1 tablet by mouth daily.    Marland Kitchen LORazepam (ATIVAN) 0.5 MG tablet Take 0.5 mg by mouth as needed for anxiety.    . metolazone (ZAROXOLYN) 5 MG tablet Take 5 mg by mouth. Tuesday & Wednesday    . mometasone (ELOCON) 0.1 % ointment Apply 1 application topically  daily as needed.    . nitroGLYCERIN (NITROSTAT) 0.4 MG SL tablet Place 0.4 mg under the tongue every 5 (five) minutes as needed for chest pain.    Marland Kitchen NOVOLOG FLEXPEN 100 UNIT/ML FlexPen Inject 1-10 Units into the skin 3 (three) times daily before meals.    . pantoprazole (PROTONIX) 20 MG tablet Take 20 mg by mouth daily.    . polyethylene glycol (MIRALAX / GLYCOLAX) 17 g packet Take 17 g by mouth daily as needed for mild constipation. 14 each 0  . potassium chloride SA (KLOR-CON) 20 MEQ tablet Take 2 tablets (40 mEq total) by mouth daily. 60 tablet 11  . sertraline (ZOLOFT) 50 MG tablet Take 50  mg by mouth at bedtime.     . tamsulosin (FLOMAX) 0.4 MG CAPS capsule Take 0.4 mg by mouth daily.    Marland Kitchen torsemide (DEMADEX) 20 MG tablet Take 20 mg by mouth daily. 3 tablets in the morning and 3 tablets at bedtime.    . traMADol (ULTRAM) 50 MG tablet Take 1 tablet (50 mg total) by mouth 3 (three) times daily as needed for moderate pain. 30 tablet 0  . warfarin (COUMADIN) 2.5 MG tablet Take 0.5-1 tablets (1.25-2.5 mg total) by mouth See admin instructions. 2.5mg  MWF and 1.25mg  every other day. (Patient taking differently: Take 1.125-2.5 mg by mouth See admin instructions. 5mg  on Monday & Friday and 2.5mg  all other days - MANAGED BY PCP)     No current facility-administered medications for this visit.   Allergies:  Penicillins, Ciprofloxacin, and Ibuprofen   Social History: The patient  reports that she has been smoking cigarettes. She started smoking about 42 years ago. She has been smoking about 0.75 packs per day. She has never used smokeless tobacco. She reports that she does not drink alcohol and does not use drugs.   Family History: The patient's family history includes Diabetes in her father and mother; Hypertension in her father and mother.   ROS:  Please see the history of present illness. Otherwise, complete review of systems is positive for none.  All other systems are reviewed and negative.    Physical Exam: VS:  BP 118/62   Pulse 61   Ht 4\' 10"  (1.473 m)   Wt 218 lb 12.8 oz (99.2 kg)   SpO2 91% Comment: 4 L / min  BMI 45.73 kg/m , BMI Body mass index is 45.73 kg/m.  Wt Readings from Last 3 Encounters:  11/11/20 218 lb 12.8 oz (99.2 kg)  10/28/20 222 lb 9.6 oz (101 kg)  08/04/20 205 lb 14.6 oz (93.4 kg)    General: Morbidly obese patient appears comfortable at rest. Neck: Supple, no elevated JVP or carotid bruits, no thyromegaly.  Has tracheostomy in place. Lungs: Mild inspiratory and expiratory wheezes noted., nonlabored breathing at rest. Cardiac: Regular rate and rhythm, no S3 or significant systolic murmur, no pericardial rub. Extremities: Mild pitting edema, distal pulses 2+. Skin: Warm and dry. Musculoskeletal: No kyphosis. Neuropsychiatric: Alert and oriented x3, affect grossly appropriate.  ECG:  EKG October 28, 2020 normal sinus rhythm rate of 72, anterior infarct, age undetermined, marked ST abnormality, possible lateral subendocardial injury.  Recent Labwork: 08/01/2020: B Natriuretic Peptide 344.0; TSH 3.750 08/03/2020: ALT 25; AST 20 08/05/2020: Magnesium 2.7 08/07/2020: BUN 58; Creatinine, Ser 1.20; Hemoglobin 15.2; Platelets 285; Potassium 3.0; Sodium 136     Component Value Date/Time   CHOL 221 (H) 05/05/2020 0928   TRIG 165 (H) 05/05/2020 0928   HDL 71 05/05/2020 0928   CHOLHDL 3.1 05/05/2020 0928   CHOLHDL 3.8 03/24/2018 0451   VLDL 20 03/24/2018 0451   LDLCALC 121 (H) 05/05/2020 0928    Other Studies Reviewed Today:  Echocardiogram 07/02/2020 1. Difficult asssessment of LVEF in setting of aflutter as well as limited visualization. . Left ventricular ejection fraction, by estimation, is 50%. The left ventricle has mildly decreased function. The left ventricle demonstrates global hypokinesis. There is mild left ventricular hypertrophy. Left ventricular diastolic parameters are indeterminate. 2. Right ventricular systolic function was  not well visualized. The right ventricular size is not well visualized. 3. Left atrial size was mildly dilated. 4. The mitral valve is normal in structure. No evidence of mitral  valve regurgitation. No evidence of mitral stenosis. 5. The aortic valve is tricuspid. Aortic valve regurgitation is not visualized. No aortic stenosis is present. 6. The inferior vena cava is normal in size with greater than 50% respiratory variability, suggesting right atrial pressure of 3 mmHg.   03/24/2018 RIGHT/LEFT HEART CATH AND CORONARY/GRAFT ANGIOGRAPHY   Conclusion   Severe native coronary artery disease with total occlusion of the proximal LAD, total occlusion of the proximal RCA, and patent circumflex with patent proximal stent with eccentric 50% proximal narrowing.  Distal obtuse marginal branches are severely and diffusely diseased.  Bypass graft failure with total occlusion of SVG to RCA.  Patent LIMA to LAD.  Right coronary territory is supplied by collaterals from LAD and circumflex.  Left ventricular systolic dysfunction with EF 35 to 45%.  Mid to distal inferior wall akinesis.  Inferobasal and inferoapical wall severely hypokinetic.  Left ventricular end-diastolic pressure elevated at 19 mmHg  Moderate to severe pulmonary hypertension  RECOMMENDATIONS:   Resume Coumadin therapy overlap with heparin  Continue Plavix  Management of pulmonary status to maintain adequate oxygenation which may help decrease severity of pulmonary hypertension.   Recommend to resume Warfarin, at currently prescribed dose and frequency, on today.  Recommend concurrent antiplatelet therapy of Clopidogrel 75mg  daily for Indefinite  Diagnostic Dominance: Right       Assessment and Plan:  1. Chronic combined systolic (congestive) and diastolic (congestive) heart failure ([place])   2. Essential hypertension   3. CAD in native artery   4. History of DVT (deep vein thrombosis)   5. Atrial  fibrillation, unspecified type ([place])    1. Chronic combined systolic (congestive) and diastolic (congestive) heart failure (HCC) She has lost some weight about 4 pounds since last check.  Weight today in clinic was 218.  However she states at home she weighed 216 pounds this morning.  Continue torsemide 60 mg a.m. and 60 mg p.m.  Continue metolazone 5 mg every Tuesday and Wednesday.  Continue digoxin 0.125 mg daily.  Continue potassium 40 mg p.o. daily.  Please get a basic metabolic panel and magnesium.  2. Essential hypertension Blood pressure well controlled today at 118/62.  3. CAD in native artery No anginal or exertional symptoms or nitroglycerin use.  Continue nitroglycerin as needed.  Continue atorvastatin 20 mg daily.  4. History of DVT (deep vein thrombosis) No DVT or PE-like symptoms.  Continue Coumadin as directed.  5. Atrial fibrillation, unspecified type ([place]) Heart rate is regular today rate of 61.  Continue Coumadin 2.5 mg Monday Wednesday and Friday and 1.2 mg every other day.  Follow with Coumadin clinic.  Medication Adjustments/Labs and Tests Ordered: Current medicines are reviewed at length with the patient today.  Concerns regarding medicines are outlined above.   Disposition: Follow-up with Dr. Harl Bowie or APP 1 month  Signed, Levell July, NP 11/11/2020 11:22 AM    [facility] [place], [place], [place] [address] Phone: [phone]; Fax: [phone]

## 2020-11-11 ENCOUNTER — Other Ambulatory Visit: Payer: Self-pay

## 2020-11-11 ENCOUNTER — Ambulatory Visit (INDEPENDENT_AMBULATORY_CARE_PROVIDER_SITE_OTHER): Payer: Medicare Other | Admitting: Family Medicine

## 2020-11-11 ENCOUNTER — Encounter: Payer: Self-pay | Admitting: Family Medicine

## 2020-11-11 VITALS — BP 118/62 | HR 61 | Ht <= 58 in | Wt 218.8 lb

## 2020-11-11 DIAGNOSIS — F419 Anxiety disorder, unspecified: Secondary | ICD-10-CM | POA: Diagnosis not present

## 2020-11-11 DIAGNOSIS — F17210 Nicotine dependence, cigarettes, uncomplicated: Secondary | ICD-10-CM | POA: Diagnosis not present

## 2020-11-11 DIAGNOSIS — T462X5D Adverse effect of other antidysrhythmic drugs, subsequent encounter: Secondary | ICD-10-CM | POA: Diagnosis not present

## 2020-11-11 DIAGNOSIS — Z86718 Personal history of other venous thrombosis and embolism: Secondary | ICD-10-CM

## 2020-11-11 DIAGNOSIS — I10 Essential (primary) hypertension: Secondary | ICD-10-CM | POA: Diagnosis not present

## 2020-11-11 DIAGNOSIS — Z794 Long term (current) use of insulin: Secondary | ICD-10-CM | POA: Diagnosis not present

## 2020-11-11 DIAGNOSIS — I2510 Atherosclerotic heart disease of native coronary artery without angina pectoris: Secondary | ICD-10-CM

## 2020-11-11 DIAGNOSIS — I82409 Acute embolism and thrombosis of unspecified deep veins of unspecified lower extremity: Secondary | ICD-10-CM | POA: Diagnosis not present

## 2020-11-11 DIAGNOSIS — I110 Hypertensive heart disease with heart failure: Secondary | ICD-10-CM | POA: Diagnosis not present

## 2020-11-11 DIAGNOSIS — I509 Heart failure, unspecified: Secondary | ICD-10-CM | POA: Diagnosis not present

## 2020-11-11 DIAGNOSIS — I5042 Chronic combined systolic (congestive) and diastolic (congestive) heart failure: Secondary | ICD-10-CM | POA: Diagnosis not present

## 2020-11-11 DIAGNOSIS — I4891 Unspecified atrial fibrillation: Secondary | ICD-10-CM | POA: Diagnosis not present

## 2020-11-11 DIAGNOSIS — Z7984 Long term (current) use of oral hypoglycemic drugs: Secondary | ICD-10-CM | POA: Diagnosis not present

## 2020-11-11 DIAGNOSIS — J449 Chronic obstructive pulmonary disease, unspecified: Secondary | ICD-10-CM | POA: Diagnosis not present

## 2020-11-11 DIAGNOSIS — Z9981 Dependence on supplemental oxygen: Secondary | ICD-10-CM | POA: Diagnosis not present

## 2020-11-11 DIAGNOSIS — K219 Gastro-esophageal reflux disease without esophagitis: Secondary | ICD-10-CM | POA: Diagnosis not present

## 2020-11-11 DIAGNOSIS — Z951 Presence of aortocoronary bypass graft: Secondary | ICD-10-CM | POA: Diagnosis not present

## 2020-11-11 DIAGNOSIS — E032 Hypothyroidism due to medicaments and other exogenous substances: Secondary | ICD-10-CM | POA: Diagnosis not present

## 2020-11-11 DIAGNOSIS — E1140 Type 2 diabetes mellitus with diabetic neuropathy, unspecified: Secondary | ICD-10-CM | POA: Diagnosis not present

## 2020-11-11 MED ORDER — DIGOXIN 125 MCG PO TABS: 125.0000 ug | ORAL_TABLET | Freq: Every day | ORAL | 6 refills | Status: DC

## 2020-11-11 MED ORDER — POTASSIUM CHLORIDE CRYS ER 20 MEQ PO TBCR: 40.0000 meq | EXTENDED_RELEASE_TABLET | Freq: Every day | ORAL | 6 refills | Status: DC

## 2020-11-11 NOTE — Patient Instructions (Addendum)
Medication Instructions:   Potassium & Digoxin refilled today.   Continue all other medications.    Labwork:  BMET, Mg - orders given today.   Please do in 2 weeks - around 11/25/2020.  Office will contact with results via phone or letter.    Testing/Procedures: none  Follow-Up: 1 month   Any Other Special Instructions Will Be Listed Below (If Applicable).  If you need a refill on your cardiac medications before your next appointment, please call your pharmacy.

## 2020-11-17 ENCOUNTER — Telehealth: Payer: Self-pay | Admitting: *Deleted

## 2020-11-17 DIAGNOSIS — I10 Essential (primary) hypertension: Secondary | ICD-10-CM

## 2020-11-17 DIAGNOSIS — Z5181 Encounter for therapeutic drug level monitoring: Secondary | ICD-10-CM

## 2020-11-17 NOTE — Telephone Encounter (Signed)
Advised patient of lab results and mailed lab orders  

## 2020-11-17 NOTE — Telephone Encounter (Signed)
-----   Message from [name], MD sent at 11/13/2020  1:30 PM EST ----- Creatinine is mildly elevated.  Potassium is stable.  Follow-up in 1 month with repeat basic metabolic profile

## 2020-11-21 DIAGNOSIS — F17210 Nicotine dependence, cigarettes, uncomplicated: Secondary | ICD-10-CM | POA: Diagnosis not present

## 2020-11-21 DIAGNOSIS — E1140 Type 2 diabetes mellitus with diabetic neuropathy, unspecified: Secondary | ICD-10-CM | POA: Diagnosis not present

## 2020-11-21 DIAGNOSIS — I110 Hypertensive heart disease with heart failure: Secondary | ICD-10-CM | POA: Diagnosis not present

## 2020-11-21 DIAGNOSIS — J449 Chronic obstructive pulmonary disease, unspecified: Secondary | ICD-10-CM | POA: Diagnosis not present

## 2020-11-21 DIAGNOSIS — I509 Heart failure, unspecified: Secondary | ICD-10-CM | POA: Diagnosis not present

## 2020-11-21 DIAGNOSIS — K219 Gastro-esophageal reflux disease without esophagitis: Secondary | ICD-10-CM | POA: Diagnosis not present

## 2020-11-25 DIAGNOSIS — J449 Chronic obstructive pulmonary disease, unspecified: Secondary | ICD-10-CM | POA: Diagnosis not present

## 2020-11-25 DIAGNOSIS — I509 Heart failure, unspecified: Secondary | ICD-10-CM | POA: Diagnosis not present

## 2020-11-25 DIAGNOSIS — F17210 Nicotine dependence, cigarettes, uncomplicated: Secondary | ICD-10-CM | POA: Diagnosis not present

## 2020-11-25 DIAGNOSIS — E1140 Type 2 diabetes mellitus with diabetic neuropathy, unspecified: Secondary | ICD-10-CM | POA: Diagnosis not present

## 2020-11-25 DIAGNOSIS — K219 Gastro-esophageal reflux disease without esophagitis: Secondary | ICD-10-CM | POA: Diagnosis not present

## 2020-11-25 DIAGNOSIS — I110 Hypertensive heart disease with heart failure: Secondary | ICD-10-CM | POA: Diagnosis not present

## 2020-11-27 DIAGNOSIS — F17210 Nicotine dependence, cigarettes, uncomplicated: Secondary | ICD-10-CM | POA: Diagnosis not present

## 2020-11-27 DIAGNOSIS — Z930 Tracheostomy status: Secondary | ICD-10-CM | POA: Diagnosis not present

## 2020-11-27 DIAGNOSIS — J398 Other specified diseases of upper respiratory tract: Secondary | ICD-10-CM | POA: Diagnosis not present

## 2020-11-27 DIAGNOSIS — J3802 Paralysis of vocal cords and larynx, bilateral: Secondary | ICD-10-CM | POA: Diagnosis not present

## 2020-11-27 DIAGNOSIS — Z9981 Dependence on supplemental oxygen: Secondary | ICD-10-CM | POA: Diagnosis not present

## 2020-12-01 DIAGNOSIS — K219 Gastro-esophageal reflux disease without esophagitis: Secondary | ICD-10-CM | POA: Diagnosis not present

## 2020-12-01 DIAGNOSIS — E1140 Type 2 diabetes mellitus with diabetic neuropathy, unspecified: Secondary | ICD-10-CM | POA: Diagnosis not present

## 2020-12-01 DIAGNOSIS — J449 Chronic obstructive pulmonary disease, unspecified: Secondary | ICD-10-CM | POA: Diagnosis not present

## 2020-12-01 DIAGNOSIS — I509 Heart failure, unspecified: Secondary | ICD-10-CM | POA: Diagnosis not present

## 2020-12-01 DIAGNOSIS — F17210 Nicotine dependence, cigarettes, uncomplicated: Secondary | ICD-10-CM | POA: Diagnosis not present

## 2020-12-01 DIAGNOSIS — I110 Hypertensive heart disease with heart failure: Secondary | ICD-10-CM | POA: Diagnosis not present

## 2020-12-04 DIAGNOSIS — K219 Gastro-esophageal reflux disease without esophagitis: Secondary | ICD-10-CM | POA: Diagnosis not present

## 2020-12-04 DIAGNOSIS — E119 Type 2 diabetes mellitus without complications: Secondary | ICD-10-CM | POA: Diagnosis not present

## 2020-12-04 DIAGNOSIS — I110 Hypertensive heart disease with heart failure: Secondary | ICD-10-CM | POA: Diagnosis not present

## 2020-12-04 DIAGNOSIS — E1140 Type 2 diabetes mellitus with diabetic neuropathy, unspecified: Secondary | ICD-10-CM | POA: Diagnosis not present

## 2020-12-04 DIAGNOSIS — F17210 Nicotine dependence, cigarettes, uncomplicated: Secondary | ICD-10-CM | POA: Diagnosis not present

## 2020-12-04 DIAGNOSIS — I509 Heart failure, unspecified: Secondary | ICD-10-CM | POA: Diagnosis not present

## 2020-12-04 DIAGNOSIS — J449 Chronic obstructive pulmonary disease, unspecified: Secondary | ICD-10-CM | POA: Diagnosis not present

## 2020-12-05 ENCOUNTER — Encounter (HOSPITAL_COMMUNITY): Payer: Self-pay | Admitting: *Deleted

## 2020-12-05 ENCOUNTER — Other Ambulatory Visit: Payer: Self-pay

## 2020-12-05 ENCOUNTER — Emergency Department (HOSPITAL_COMMUNITY): Payer: Medicare Other

## 2020-12-05 ENCOUNTER — Inpatient Hospital Stay (HOSPITAL_COMMUNITY)
Admission: EM | Admit: 2020-12-05 | Discharge: 2020-12-12 | DRG: 291 | Disposition: A | Discharge status: Home Health | Payer: Medicare Other | Attending: Internal Medicine | Admitting: Internal Medicine

## 2020-12-05 DIAGNOSIS — E039 Hypothyroidism, unspecified: Secondary | ICD-10-CM | POA: Diagnosis present

## 2020-12-05 DIAGNOSIS — R0600 Dyspnea, unspecified: Secondary | ICD-10-CM | POA: Diagnosis not present

## 2020-12-05 DIAGNOSIS — K219 Gastro-esophageal reflux disease without esophagitis: Secondary | ICD-10-CM | POA: Diagnosis present

## 2020-12-05 DIAGNOSIS — R21 Rash and other nonspecific skin eruption: Secondary | ICD-10-CM | POA: Diagnosis not present

## 2020-12-05 DIAGNOSIS — J441 Chronic obstructive pulmonary disease with (acute) exacerbation: Secondary | ICD-10-CM | POA: Diagnosis present

## 2020-12-05 DIAGNOSIS — N1832 Chronic kidney disease, stage 3b: Secondary | ICD-10-CM | POA: Diagnosis present

## 2020-12-05 DIAGNOSIS — I82409 Acute embolism and thrombosis of unspecified deep veins of unspecified lower extremity: Secondary | ICD-10-CM | POA: Diagnosis not present

## 2020-12-05 DIAGNOSIS — J811 Chronic pulmonary edema: Secondary | ICD-10-CM | POA: Diagnosis not present

## 2020-12-05 DIAGNOSIS — I4891 Unspecified atrial fibrillation: Secondary | ICD-10-CM | POA: Diagnosis present

## 2020-12-05 DIAGNOSIS — I5043 Acute on chronic combined systolic (congestive) and diastolic (congestive) heart failure: Secondary | ICD-10-CM | POA: Diagnosis not present

## 2020-12-05 DIAGNOSIS — Z7901 Long term (current) use of anticoagulants: Secondary | ICD-10-CM

## 2020-12-05 DIAGNOSIS — Z8249 Family history of ischemic heart disease and other diseases of the circulatory system: Secondary | ICD-10-CM

## 2020-12-05 DIAGNOSIS — Z930 Tracheostomy status: Secondary | ICD-10-CM

## 2020-12-05 DIAGNOSIS — J9611 Chronic respiratory failure with hypoxia: Secondary | ICD-10-CM | POA: Diagnosis not present

## 2020-12-05 DIAGNOSIS — E119 Type 2 diabetes mellitus without complications: Secondary | ICD-10-CM

## 2020-12-05 DIAGNOSIS — I272 Pulmonary hypertension, unspecified: Secondary | ICD-10-CM | POA: Diagnosis present

## 2020-12-05 DIAGNOSIS — F32A Depression, unspecified: Secondary | ICD-10-CM | POA: Diagnosis present

## 2020-12-05 DIAGNOSIS — E872 Acidosis: Secondary | ICD-10-CM | POA: Diagnosis present

## 2020-12-05 DIAGNOSIS — E11649 Type 2 diabetes mellitus with hypoglycemia without coma: Secondary | ICD-10-CM | POA: Diagnosis not present

## 2020-12-05 DIAGNOSIS — J9622 Acute and chronic respiratory failure with hypercapnia: Secondary | ICD-10-CM | POA: Diagnosis present

## 2020-12-05 DIAGNOSIS — I2510 Atherosclerotic heart disease of native coronary artery without angina pectoris: Secondary | ICD-10-CM | POA: Diagnosis present

## 2020-12-05 DIAGNOSIS — Z8541 Personal history of malignant neoplasm of cervix uteri: Secondary | ICD-10-CM

## 2020-12-05 DIAGNOSIS — D6862 Lupus anticoagulant syndrome: Secondary | ICD-10-CM | POA: Diagnosis present

## 2020-12-05 DIAGNOSIS — Z79890 Hormone replacement therapy (postmenopausal): Secondary | ICD-10-CM

## 2020-12-05 DIAGNOSIS — Z886 Allergy status to analgesic agent status: Secondary | ICD-10-CM

## 2020-12-05 DIAGNOSIS — I509 Heart failure, unspecified: Secondary | ICD-10-CM | POA: Diagnosis not present

## 2020-12-05 DIAGNOSIS — R9431 Abnormal electrocardiogram [ECG] [EKG]: Secondary | ICD-10-CM | POA: Diagnosis not present

## 2020-12-05 DIAGNOSIS — I5042 Chronic combined systolic (congestive) and diastolic (congestive) heart failure: Secondary | ICD-10-CM | POA: Diagnosis present

## 2020-12-05 DIAGNOSIS — J9621 Acute and chronic respiratory failure with hypoxia: Secondary | ICD-10-CM | POA: Diagnosis present

## 2020-12-05 DIAGNOSIS — E871 Hypo-osmolality and hyponatremia: Secondary | ICD-10-CM | POA: Diagnosis not present

## 2020-12-05 DIAGNOSIS — Z794 Long term (current) use of insulin: Secondary | ICD-10-CM | POA: Diagnosis not present

## 2020-12-05 DIAGNOSIS — Z955 Presence of coronary angioplasty implant and graft: Secondary | ICD-10-CM

## 2020-12-05 DIAGNOSIS — I110 Hypertensive heart disease with heart failure: Secondary | ICD-10-CM | POA: Diagnosis not present

## 2020-12-05 DIAGNOSIS — R0602 Shortness of breath: Secondary | ICD-10-CM

## 2020-12-05 DIAGNOSIS — I4892 Unspecified atrial flutter: Secondary | ICD-10-CM | POA: Diagnosis present

## 2020-12-05 DIAGNOSIS — I130 Hypertensive heart and chronic kidney disease with heart failure and stage 1 through stage 4 chronic kidney disease, or unspecified chronic kidney disease: Secondary | ICD-10-CM | POA: Diagnosis present

## 2020-12-05 DIAGNOSIS — I25810 Atherosclerosis of coronary artery bypass graft(s) without angina pectoris: Secondary | ICD-10-CM | POA: Diagnosis present

## 2020-12-05 DIAGNOSIS — I252 Old myocardial infarction: Secondary | ICD-10-CM

## 2020-12-05 DIAGNOSIS — E1122 Type 2 diabetes mellitus with diabetic chronic kidney disease: Secondary | ICD-10-CM | POA: Diagnosis present

## 2020-12-05 DIAGNOSIS — I82563 Chronic embolism and thrombosis of calf muscular vein, bilateral: Secondary | ICD-10-CM | POA: Diagnosis not present

## 2020-12-05 DIAGNOSIS — I361 Nonrheumatic tricuspid (valve) insufficiency: Secondary | ICD-10-CM | POA: Diagnosis not present

## 2020-12-05 DIAGNOSIS — Z20822 Contact with and (suspected) exposure to covid-19: Secondary | ICD-10-CM | POA: Diagnosis present

## 2020-12-05 DIAGNOSIS — F17210 Nicotine dependence, cigarettes, uncomplicated: Secondary | ICD-10-CM | POA: Diagnosis present

## 2020-12-05 DIAGNOSIS — Z880 Allergy status to penicillin: Secondary | ICD-10-CM

## 2020-12-05 DIAGNOSIS — I517 Cardiomegaly: Secondary | ICD-10-CM | POA: Diagnosis not present

## 2020-12-05 DIAGNOSIS — Z79899 Other long term (current) drug therapy: Secondary | ICD-10-CM

## 2020-12-05 DIAGNOSIS — Z86718 Personal history of other venous thrombosis and embolism: Secondary | ICD-10-CM

## 2020-12-05 DIAGNOSIS — Z8674 Personal history of sudden cardiac arrest: Secondary | ICD-10-CM

## 2020-12-05 DIAGNOSIS — L409 Psoriasis, unspecified: Secondary | ICD-10-CM | POA: Diagnosis present

## 2020-12-05 DIAGNOSIS — E785 Hyperlipidemia, unspecified: Secondary | ICD-10-CM | POA: Diagnosis present

## 2020-12-05 DIAGNOSIS — I5023 Acute on chronic systolic (congestive) heart failure: Secondary | ICD-10-CM | POA: Diagnosis not present

## 2020-12-05 DIAGNOSIS — Z6841 Body Mass Index (BMI) 40.0 and over, adult: Secondary | ICD-10-CM

## 2020-12-05 DIAGNOSIS — J9620 Acute and chronic respiratory failure, unspecified whether with hypoxia or hypercapnia: Secondary | ICD-10-CM | POA: Diagnosis present

## 2020-12-05 DIAGNOSIS — Z881 Allergy status to other antibiotic agents status: Secondary | ICD-10-CM

## 2020-12-05 DIAGNOSIS — J90 Pleural effusion, not elsewhere classified: Secondary | ICD-10-CM | POA: Diagnosis not present

## 2020-12-05 DIAGNOSIS — E1165 Type 2 diabetes mellitus with hyperglycemia: Secondary | ICD-10-CM | POA: Diagnosis present

## 2020-12-05 DIAGNOSIS — K7460 Unspecified cirrhosis of liver: Secondary | ICD-10-CM | POA: Diagnosis not present

## 2020-12-05 DIAGNOSIS — Z299 Encounter for prophylactic measures, unspecified: Secondary | ICD-10-CM | POA: Diagnosis not present

## 2020-12-05 DIAGNOSIS — E876 Hypokalemia: Secondary | ICD-10-CM | POA: Diagnosis not present

## 2020-12-05 DIAGNOSIS — N809 Endometriosis, unspecified: Secondary | ICD-10-CM | POA: Diagnosis present

## 2020-12-05 DIAGNOSIS — E1140 Type 2 diabetes mellitus with diabetic neuropathy, unspecified: Secondary | ICD-10-CM | POA: Diagnosis not present

## 2020-12-05 DIAGNOSIS — Z833 Family history of diabetes mellitus: Secondary | ICD-10-CM

## 2020-12-05 LAB — CBC WITH DIFFERENTIAL/PLATELET
Abs Immature Granulocytes: 0.05 10*3/uL (ref 0.00–0.07)
Basophils Absolute: 0 10*3/uL (ref 0.0–0.1)
Basophils Relative: 1 %
Eosinophils Absolute: 0 10*3/uL (ref 0.0–0.5)
Eosinophils Relative: 0 %
HCT: 43.8 % (ref 36.0–46.0)
Hemoglobin: 13.4 g/dL (ref 12.0–15.0)
Immature Granulocytes: 1 %
Lymphocytes Relative: 17 %
Lymphs Abs: 1.3 10*3/uL (ref 0.7–4.0)
MCH: 29.3 pg (ref 26.0–34.0)
MCHC: 30.6 g/dL (ref 30.0–36.0)
MCV: 95.6 fL (ref 80.0–100.0)
Monocytes Absolute: 0.5 10*3/uL (ref 0.1–1.0)
Monocytes Relative: 7 %
Neutro Abs: 6 10*3/uL (ref 1.7–7.7)
Neutrophils Relative %: 74 %
Platelets: 220 10*3/uL (ref 150–400)
RBC: 4.58 MIL/uL (ref 3.87–5.11)
RDW: 16.8 % — ABNORMAL HIGH (ref 11.5–15.5)
WBC: 8 10*3/uL (ref 4.0–10.5)
nRBC: 0 % (ref 0.0–0.2)

## 2020-12-05 LAB — COMPREHENSIVE METABOLIC PANEL
ALT: 23 U/L (ref 0–44)
AST: 14 U/L — ABNORMAL LOW (ref 15–41)
Albumin: 2.8 g/dL — ABNORMAL LOW (ref 3.5–5.0)
Alkaline Phosphatase: 133 U/L — ABNORMAL HIGH (ref 38–126)
Anion gap: 11 (ref 5–15)
BUN: 34 mg/dL — ABNORMAL HIGH (ref 6–20)
CO2: 30 mmol/L (ref 22–32)
Calcium: 8.3 mg/dL — ABNORMAL LOW (ref 8.9–10.3)
Chloride: 91 mmol/L — ABNORMAL LOW (ref 98–111)
Creatinine, Ser: 1.56 mg/dL — ABNORMAL HIGH (ref 0.44–1.00)
GFR, Estimated: 39 mL/min — ABNORMAL LOW (ref >60)
Glucose, Bld: 314 mg/dL — ABNORMAL HIGH (ref 70–99)
Potassium: 4.3 mmol/L (ref 3.5–5.1)
Sodium: 132 mmol/L — ABNORMAL LOW (ref 135–145)
Total Bilirubin: 0.8 mg/dL (ref 0.3–1.2)
Total Protein: 6.9 g/dL (ref 6.5–8.1)

## 2020-12-05 LAB — CBG MONITORING, ED: Glucose-Capillary: 329 mg/dL — ABNORMAL HIGH (ref 70–99)

## 2020-12-05 LAB — BLOOD GAS, ARTERIAL
Acid-Base Excess: 6.9 mmol/L — ABNORMAL HIGH (ref 0.0–2.0)
Bicarbonate: 29.9 mmol/L — ABNORMAL HIGH (ref 20.0–28.0)
FIO2: 48
O2 Saturation: 92.6 %
Patient temperature: 36.5
pCO2 arterial: 48.9 mmHg — ABNORMAL HIGH (ref 32.0–48.0)
pH, Arterial: 7.421 (ref 7.350–7.450)
pO2, Arterial: 65.8 mmHg — ABNORMAL LOW (ref 83.0–108.0)

## 2020-12-05 LAB — TROPONIN I (HIGH SENSITIVITY)
Troponin I (High Sensitivity): 15 ng/L (ref <18)
Troponin I (High Sensitivity): 14 ng/L (ref <18)

## 2020-12-05 LAB — PROTIME-INR
INR: 2.8 — ABNORMAL HIGH (ref 0.8–1.2)
Prothrombin Time: 28.9 seconds — ABNORMAL HIGH (ref 11.4–15.2)

## 2020-12-05 LAB — POC SARS CORONAVIRUS 2 AG -  ED: SARS Coronavirus 2 Ag: NEGATIVE

## 2020-12-05 LAB — HEMOGLOBIN A1C
Hgb A1c MFr Bld: 9.1 % — ABNORMAL HIGH (ref 4.8–5.6)
Mean Plasma Glucose: 214.47 mg/dL

## 2020-12-05 LAB — RESP PANEL BY RT-PCR (FLU A&B, COVID) ARPGX2
Influenza A by PCR: NEGATIVE
Influenza B by PCR: NEGATIVE
SARS Coronavirus 2 by RT PCR: NEGATIVE

## 2020-12-05 LAB — LIPASE, BLOOD: Lipase: 27 U/L (ref 11–51)

## 2020-12-05 LAB — BRAIN NATRIURETIC PEPTIDE: B Natriuretic Peptide: 695 pg/mL — ABNORMAL HIGH (ref 0.0–100.0)

## 2020-12-05 MED ORDER — METOPROLOL SUCCINATE ER 25 MG PO TB24
25.0000 mg | ORAL_TABLET | Freq: Every day | ORAL | Status: DC
  Administered 2020-12-06 – 2020-12-12 (×7): 25 mg via ORAL
  Filled 2020-12-05 (×7): qty 1

## 2020-12-05 MED ORDER — METHYLPREDNISOLONE SODIUM SUCC 125 MG IJ SOLR
125.0000 mg | Freq: Once | INTRAMUSCULAR | Status: AC
  Administered 2020-12-05: 125 mg via INTRAVENOUS
  Filled 2020-12-05: qty 2

## 2020-12-05 MED ORDER — ALBUTEROL SULFATE (2.5 MG/3ML) 0.083% IN NEBU
5.0000 mg | INHALATION_SOLUTION | Freq: Once | RESPIRATORY_TRACT | Status: AC
  Administered 2020-12-05: 5 mg via RESPIRATORY_TRACT
  Filled 2020-12-05: qty 6

## 2020-12-05 MED ORDER — INSULIN ASPART 100 UNIT/ML ~~LOC~~ SOLN
0.0000 [IU] | Freq: Every day | SUBCUTANEOUS | Status: DC
  Administered 2020-12-05: 4 [IU] via SUBCUTANEOUS
  Filled 2020-12-05: qty 1

## 2020-12-05 MED ORDER — INSULIN ASPART 100 UNIT/ML ~~LOC~~ SOLN
0.0000 [IU] | Freq: Three times a day (TID) | SUBCUTANEOUS | Status: DC
  Administered 2020-12-06: 15 [IU] via SUBCUTANEOUS
  Administered 2020-12-06: 5 [IU] via SUBCUTANEOUS
  Administered 2020-12-06 – 2020-12-07 (×2): 15 [IU] via SUBCUTANEOUS
  Administered 2020-12-07: 5 [IU] via SUBCUTANEOUS
  Administered 2020-12-07: 11 [IU] via SUBCUTANEOUS

## 2020-12-05 MED ORDER — SODIUM CHLORIDE 0.9 % IV SOLN: 500.0000 mg | INTRAVENOUS | Status: DC

## 2020-12-05 MED ORDER — IPRATROPIUM BROMIDE 0.02 % IN SOLN
0.5000 mg | Freq: Once | RESPIRATORY_TRACT | Status: AC
  Administered 2020-12-05: 0.5 mg via RESPIRATORY_TRACT
  Filled 2020-12-05: qty 2.5

## 2020-12-05 MED ORDER — WARFARIN - PHARMACIST DOSING INPATIENT: Freq: Every day | Status: DC

## 2020-12-05 MED ORDER — ALBUTEROL SULFATE (2.5 MG/3ML) 0.083% IN NEBU: 2.5000 mg | INHALATION_SOLUTION | RESPIRATORY_TRACT | Status: DC | PRN

## 2020-12-05 MED ORDER — FUROSEMIDE 10 MG/ML IJ SOLN
40.0000 mg | Freq: Once | INTRAMUSCULAR | Status: AC
  Administered 2020-12-05: 40 mg via INTRAVENOUS
  Filled 2020-12-05: qty 4

## 2020-12-05 MED ORDER — MOMETASONE FUROATE 0.1 % EX CREA
1.0000 "application " | TOPICAL_CREAM | Freq: Every day | CUTANEOUS | Status: DC
  Administered 2020-12-06 – 2020-12-10 (×5): 1 via TOPICAL
  Filled 2020-12-05: qty 15

## 2020-12-05 MED ORDER — WARFARIN SODIUM 2.5 MG PO TABS
2.5000 mg | ORAL_TABLET | Freq: Once | ORAL | Status: AC
  Administered 2020-12-05: 2.5 mg via ORAL
  Filled 2020-12-05: qty 1

## 2020-12-05 MED ORDER — DOXYCYCLINE HYCLATE 100 MG PO TABS
100.0000 mg | ORAL_TABLET | Freq: Two times a day (BID) | ORAL | Status: DC
  Administered 2020-12-05 – 2020-12-07 (×4): 100 mg via ORAL
  Filled 2020-12-05 (×4): qty 1

## 2020-12-05 MED ORDER — FUROSEMIDE 10 MG/ML IJ SOLN: 40.0000 mg | Freq: Once | INTRAMUSCULAR | Status: DC

## 2020-12-05 NOTE — Progress Notes (Signed)
Patient's inner cannula cleaned and replaced. Trach site dry. BBS  slightly diminished , SpO2 95% on 40% TC.

## 2020-12-05 NOTE — H&P (Signed)
TRH H&P   Patient Demographics:    Theresa [name], is a 55 y.o. female  MRN: [id number]   DOB - [birth year]  Admit Date - 12/05/2020  Outpatient Primary MD for the patient is Monico Blitz, MD  Referring MD/NP/PA: PA Sophia    Patient coming from: Home  Chief Complaint  Patient presents with  . Shortness of Breath      HPI:    Theresa [name]  is a 55 y.o. female, with systolic and diastolic CHF, COPD, continued tobacco abuse, diabetes mellitus type 2, lupus anticoagulant syndrome, coronary artery disease with history of STEMI, hypertension, chronic respiratory failure on 4 L, chronic tracheostomy (since 2002). -Patient presents today secondary to complaints of shortness of breath, over the last week, she reports worsening dyspnea, leg swelling, per EMS she was 70% on her baseline 4 L nasal cannula, in ED she was 82% on 4 L, she is currently requiring ATC at 28% (10 L), denies fever, chills, chest pain, abdominal pain, no dysuria or polyuria, reports she has been taking torsemide as instructed, reports she has been compliant with her low-salt diet as well, . -In ED her chest x-ray significant cardiomegaly with vascular congestion, her BNP elevated at 195, glucose elevated at 314, she was wheezing, dyspnea has improved with IV Lasix and IV Solu-Medrol, [facility] consulted to admit.   Review of systems:    In addition to the HPI above,  No Fever-chills, No Headache, No changes with Vision or hearing, No problems swallowing food or Liquids, No Chest pain, Cough she does report worsening dyspnea No Abdominal pain, No Nausea or Vommitting, does report diarrhea No Blood in stool or Urine, No dysuria, Reports chronic psoriatic rash No new joints pains-aches,  No new weakness, tingling, numbness in any extremity, No recent weight gain or loss, No polyuria, polydypsia or  polyphagia, No significant Mental Stressors.  A full 10 point Review of Systems was done, except as stated above, all other Review of Systems were negative.   With Past History of the following :    Past Medical History:  Diagnosis Date  . Acute kidney failure with lesion of tubular necrosis (HCC)   . Acute systolic heart failure ([place])   . CAD (coronary artery disease)    a. s/p prior PCI. b. CABG 2007 at [facility] in [place] 2007. c. inferior STEMI 10/2015 s/p DES to dSVG-PDA.  . Cardiac arrest ([place])   . Cervical cancer ([place])   . Chronic diastolic CHF (congestive heart failure) ([place])   . Chronic respiratory failure ([place])    s/p tracheostomy 2002  . Chronic RUQ pain   . COPD (chronic obstructive pulmonary disease) ([place])   . Diabetes mellitus ([place])   . DVT (deep venous thrombosis) ([place])   . Endometriosis   . History of gallstones 01/2016   seen on Ultrasound  . History of HIDA scan  11/2016   normal  . HTN (hypertension)   . Hyperlipidemia   . Lupus anticoagulant disorder (HCC)    on coumadin  . Morbid obesity ([place])   . Psoriasis   . ST elevation (STEMI) myocardial infarction involving right coronary artery ([place]) 10/29/15   stent to VG to PDA  . Toe fracture, right 03/29/2018  . Tracheostomy in place [facility]), chronic since 2002 11/03/2015      Past Surgical History:  Procedure Laterality Date  . CARDIAC CATHETERIZATION N/A 10/29/2015   Procedure: Left Heart Cath and Cors/Grafts Angiography;  Surgeon: Burnell Blanks, MD;  Location: [place] [facility];  Service: Cardiovascular;  Laterality: N/A;  . CARDIAC CATHETERIZATION  10/29/2015   Procedure: Coronary Stent Intervention;  Surgeon: Burnell Blanks, MD;  Location: [place] [facility];  Service: Cardiovascular;;  . CAROTID STENT    . CESAREAN SECTION WITH BILATERAL TUBAL LIGATION    . CORONARY ARTERY BYPASS GRAFT  2007   2V  . IR GASTROSTOMY TUBE MOD SED  11/13/2018  . RIGHT/LEFT HEART CATH AND  CORONARY/GRAFT ANGIOGRAPHY N/A 03/24/2018   Procedure: RIGHT/LEFT HEART CATH AND CORONARY/GRAFT ANGIOGRAPHY;  Surgeon: Belva Crome, MD;  Location: [place] [facility];  Service: Cardiovascular;  Laterality: N/A;  . TRACHEOSTOMY        Social History:     Social History   Tobacco Use  . Smoking status: Current Every Day Smoker    Packs/day: 0.75    Types: Cigarettes    Start date: 10/23/1978  . Smokeless tobacco: Never Used  . Tobacco comment: 1/2 pack - 1 pack - trying to cut back   Substance Use Topics  . Alcohol use: No    Alcohol/week: 0.0 standard drinks        Family History :     Family History  Problem Relation Age of Onset  . Hypertension Mother   . Diabetes Mother   . Hypertension Father   . Diabetes Father   . Colon cancer Neg Hx   . Colon polyps Neg Hx       Home Medications:   Prior to Admission medications   Medication Sig Start Date End Date Taking? Authorizing Provider  albuterol (PROVENTIL) (2.5 MG/3ML) 0.083% nebulizer solution Take 3 mLs (2.5 mg total) by nebulization every 4 (four) hours as needed for wheezing. 07/07/20   Johnson, Clanford L, MD  albuterol (VENTOLIN HFA) 108 (90 Base) MCG/ACT inhaler Inhale 2 puffs into the lungs every 4 (four) hours as needed for wheezing or shortness of breath.    [provider]  atorvastatin (LIPITOR) 20 MG tablet Take 20 mg by mouth daily. 07/27/20   [provider]  digoxin (LANOXIN) 0.125 MG tablet Take 1 tablet (125 mcg total) by mouth daily. Started 10/31/2020 11/11/20   Verta Ellen., NP  gabapentin (NEURONTIN) 300 MG capsule Take 3 capsules (900 mg total) by mouth at bedtime. 07/07/20   Johnson, Clanford L, MD  insulin degludec (TRESIBA FLEXTOUCH) 100 UNIT/ML FlexTouch Pen Inject 30 Units into the skin at bedtime. 07/07/20   Johnson, Clanford L, MD  levothyroxine (SYNTHROID) 50 MCG tablet Take 1 tablet by mouth daily. 10/09/20 11/11/21  [provider]  LORazepam (ATIVAN) 0.5 MG  tablet Take 0.5 mg by mouth as needed for anxiety.    [provider]  metolazone (ZAROXOLYN) 5 MG tablet Take 5 mg by mouth. Tuesday & Wednesday    [provider]  mometasone (ELOCON) 0.1 % ointment Apply  1 application topically daily as needed. 06/24/20   [provider]  nitroGLYCERIN (NITROSTAT) 0.4 MG SL tablet Place 0.4 mg under the tongue every 5 (five) minutes as needed for chest pain.    [provider]  NOVOLOG FLEXPEN 100 UNIT/ML FlexPen Inject 1-10 Units into the skin 3 (three) times daily before meals. 04/06/20   [provider]  pantoprazole (PROTONIX) 20 MG tablet Take 20 mg by mouth daily. 02/16/20   [provider]  polyethylene glycol (MIRALAX / GLYCOLAX) 17 g packet Take 17 g by mouth daily as needed for mild constipation. 06/20/20   Kathie Dike, MD  potassium chloride SA (KLOR-CON) 20 MEQ tablet Take 2 tablets (40 mEq total) by mouth daily. 11/11/20 12/11/20  Verta Ellen., NP  sertraline (ZOLOFT) 50 MG tablet Take 50 mg by mouth at bedtime.     [provider]  tamsulosin (FLOMAX) 0.4 MG CAPS capsule Take 0.4 mg by mouth daily.    [provider]  torsemide (DEMADEX) 20 MG tablet Take 20 mg by mouth daily. 3 tablets in the morning and 3 tablets at bedtime.    [provider]  traMADol (ULTRAM) 50 MG tablet Take 1 tablet (50 mg total) by mouth 3 (three) times daily as needed for moderate pain. 08/07/20   Dessa Phi, DO  warfarin (COUMADIN) 2.5 MG tablet Take 0.5-1 tablets (1.25-2.5 mg total) by mouth See admin instructions. 2.5mg  MWF and 1.25mg  every other day. Patient taking differently: Take 1.125-2.5 mg by mouth See admin instructions. 5mg  on Monday & Friday and 2.5mg  all other days - MANAGED BY PCP 08/07/20   Dessa Phi, DO     Allergies:     Allergies  Allergen Reactions  . Penicillins Rash    Has patient had a PCN reaction causing immediate rash, facial/tongue/throat swelling,  SOB or lightheadedness with hypotension: Yes Has patient had a PCN reaction causing severe rash involving mucus membranes or skin necrosis: No Has patient had a PCN reaction that required hospitalization No Has patient had a PCN reaction occurring within the last 10 years: No If all of the above answers are "NO", then may proceed with Cephalosporin use.   REACTION: rash Pt has tolerated cefepime in the past.  . Ciprofloxacin     nausea  . Ibuprofen Rash    swelling in leg      Physical Exam:   Vitals  Blood pressure (!) 101/49, pulse (!) 56, temperature 97.7 F (36.5 C), resp. rate 18, height 4\' 10"  (1.473 m), weight 100.2 kg, SpO2 97 %.   1. General Beese female, laying in bed, no apparent distress  2. Normal affect and insight, Not Suicidal or Homicidal, Awake Alert, Oriented X 3.  3. No F.N deficits, ALL C.Nerves Intact, Strength 5/5 all 4 extremities, Sensation intact all 4 extremities, Plantars down going.  4. Ears and Eyes appear Normal, Conjunctivae clear, PERRLA. Moist Oral Mucosa.  5. Supple Neck, No JVD, No cervical lymphadenopathy appriciated, No Carotid Bruits.  Tracheostomy present  6. Symmetrical Chest wall movement, air entry bilaterally, scattered wheezing  7. RRR, No Gallops, Rubs or Murmurs, No Parasternal Heave.  +2 edema  8. Positive Bowel Sounds, Abdomen Soft, No tenderness, No organomegaly appriciated,No rebound -guarding or rigidity.  9.  No Cyanosis, Normal Skin Turgor, No Skin Rash or Bruise.  10. Good muscle tone,  joints appear normal , no effusions, Normal ROM.  11. No Palpable Lymph Nodes in Neck or Axillae  Data Review:    CBC Recent Labs  Lab 12/05/20 1714  WBC 8.0  HGB 13.4  HCT 43.8  PLT 220  MCV 95.6  MCH 29.3  MCHC 30.6  RDW 16.8*  LYMPHSABS 1.3  MONOABS 0.5  EOSABS 0.0  BASOSABS 0.0   ------------------------------------------------------------------------------------------------------------------  Chemistries   Recent Labs  Lab 12/05/20 1714  NA 132*  K 4.3  CL 91*  CO2 30  GLUCOSE 314*  BUN 34*  CREATININE 1.56*  CALCIUM 8.3*  AST 14*  ALT 23  ALKPHOS 133*  BILITOT 0.8   ------------------------------------------------------------------------------------------------------------------ estimated creatinine clearance is 41.6 mL/min (A) (by C-G formula based on SCr of 1.56 mg/dL (H)). ------------------------------------------------------------------------------------------------------------------ No results for input(s): TSH, T4TOTAL, T3FREE, THYROIDAB in the last 72 hours.  Invalid input(s): FREET3  Coagulation profile Recent Labs  Lab 12/05/20 1714  INR 2.8*   ------------------------------------------------------------------------------------------------------------------- No results for input(s): DDIMER in the last 72 hours. -------------------------------------------------------------------------------------------------------------------  Cardiac Enzymes No results for input(s): CKMB, TROPONINI, MYOGLOBIN in the last 168 hours.  Invalid input(s): CK ------------------------------------------------------------------------------------------------------------------    Component Value Date/Time   BNP 695.0 (H) 12/05/2020 1714     ---------------------------------------------------------------------------------------------------------------  Urinalysis    Component Value Date/Time   COLORURINE YELLOW 08/01/2020 1539   APPEARANCEUR CLEAR 08/01/2020 1539   LABSPEC 1.012 08/01/2020 1539   PHURINE 6.0 08/01/2020 1539   GLUCOSEU NEGATIVE 08/01/2020 1539   HGBUR MODERATE (A) 08/01/2020 1539   BILIRUBINUR NEGATIVE 08/01/2020 1539   KETONESUR NEGATIVE 08/01/2020 1539   PROTEINUR 30 (A) 08/01/2020 1539   NITRITE NEGATIVE 08/01/2020 1539   LEUKOCYTESUR NEGATIVE 08/01/2020 1539     ----------------------------------------------------------------------------------------------------------------   Imaging Results:    DG Chest 1 View  Result Date: 12/05/2020 CLINICAL DATA:  55 year old female with shortness of breath. EXAM: CHEST  1 VIEW COMPARISON:  Chest radiograph dated 10/09/2020. FINDINGS: Tracheostomy with tip above the carina. There is cardiomegaly with vascular congestion. Pneumonia is not excluded. Overall improved congestion compared to the prior radiograph. No focal consolidation or pneumothorax. No large pleural effusion. Median sternotomy wires. No acute osseous pathology. IMPRESSION: Cardiomegaly with vascular congestion. Electronically Signed   By: Anner Crete M.D.   On: 12/05/2020 17:32    My personal review of EKG: Rhythm NSR, Rate  57 /min, QTc 356, low voltage   Assessment & Plan:    Active Problems:   DM (diabetes mellitus) (HCC)   COPD exacerbation (HCC)   Acute on chronic congestive heart failure (HCC)   Acute on chronic combined systolic and diastolic HF (heart failure) (HCC)   CAD (coronary artery disease)   Cigarette nicotine dependence without complication   DVT (deep venous thrombosis) (HCC)   Atrial flutter with rapid ventricular response (HCC)   Acute on chronic respiratory failure (HCC)    Acute on chronic hypoxic respiratory failure -She is on 4 L nasal cannula at baseline, she is currently on 8 L nasal cannula at baseline,(intermittently with ATC at 28% -This is due to COPD and CHF exacerbation. -wean Oxygen as tolerated.   COPD exacerbation -n she presents with wheezing, increased trach secretions, will start on IV Solu-Medrol, she does report increased trach secretions, will so we will send tracheal aspirate, and will start her on doxycycline .  Acute on chronic systolic/diastolic CHF -Echo in August 2021 significant for EF 40 to 45% with grade 3 diastolic dysfunction, repeat echo in October 20/21 with EF of 50%,  global hypokinesis and indeterminant diastolic function. -Reports she has been compliant with torsemide, blood pressure  is soft, will keep on Lasix 40 mg IV every 8 hours. -admitted  under CHF pathway, monitor daily weights, strict ins and outs, heart healthy diet. -Her EKG showing low voltage, will obtain limited 2D echo to rule out pericardial effusion  Atrial flutter -Continue with digoxin and Toprol-XL, continue with warfarin-  Lupus anticoagulant syndrome/ COPD exacerbation -Continue prednisone -Currently stable on 5 L  Acute on chronic respiratory failure with hypoxia -Secondary to COPD exacerbation and possibly pneumonia -CT chest -Check procalcitonin -Normally on 4 L at home -Wean as tolerated  Atrial flutter -Remain on telemetry -Currently in sinus rhythm -Continue amiodarone  Chronic systolic and diastolic CHF -05/04/9370 echo EF 40-45%, grade 3 DD -07/02/20 echo EF 50%, global HK, indeterminant diastolic -holding bumex today and reeval tomorrow for restart -clinically euvolemic  Lupus anticoagulant syndrome -coumadin with pharmacy assistance  Coronary artery disease -No chest pain presently  Uncontrolled diabetes mellitus type 2 with hyperglycemia -holding lantus temporarily -NovoLog sliding scale -10/11-hemoglobin A1c--8.9 -holding Ozempic  Depression -Continue sertraline and home dose lorazepam  Diabetes  mellitus, type II, -She will be resumed on home dose Tresiba, and will add insulin sliding scale  Lupus anticoagulant syndrome, history of DVT on chronic anticoagulation -coumadin with pharmacy assistance  Coronary artery disease -No chest pain presently  Uncontrolled diabetes mellitus type 2 with hyperglycemia -holding lantus temporarily -NovoLog sliding scale -10/11-hemoglobin A1c--8.9 -holding Ozempic  Depression -Continue home medications  GERD -Continue with PPI  Hypothyroidism -Continue with  levothyroxine    DVT Prophylaxis on warfarin  AM Labs Ordered, also please review Full Orders  Family Communication: Admission, patients condition and plan of care including tests being ordered have been discussed with the patient  who indicate understanding and agree with the plan and Code Status.  Code Status Full  Likely DC to  home  Condition GUARDED    Consults called: none    Admission status: inpatient    Time spent in minutes : 65 minutes   Phillips Climes M.D on 12/05/2020 at 6:37 PM   [facility] - Office  [phone]

## 2020-12-05 NOTE — Progress Notes (Addendum)
ANTICOAGULATION CONSULT NOTE - Initial Consult  Pharmacy Consult for Warfarin Indication: VTE Treatment - Continuing PTA  Allergies  Allergen Reactions  . Penicillins Rash    Has patient had a PCN reaction causing immediate rash, facial/tongue/throat swelling, SOB or lightheadedness with hypotension: Yes Has patient had a PCN reaction causing severe rash involving mucus membranes or skin necrosis: No Has patient had a PCN reaction that required hospitalization No Has patient had a PCN reaction occurring within the last 10 years: No If all of the above answers are "NO", then may proceed with Cephalosporin use.   REACTION: rash Pt has tolerated cefepime in the past.  . Ciprofloxacin     nausea  . Ibuprofen Rash    swelling in leg     Patient Measurements: Height: 4\' 10"  (147.3 cm) Weight: 100.2 kg (221 lb) IBW/kg (Calculated) : 40.9  Vital Signs: Temp: 97.7 F (36.5 C) (04/01 1704) BP: 111/56 (04/01 1815) Pulse Rate: 56 (04/01 1815)  Labs: Recent Labs    12/05/20 1714  HGB 13.4  HCT 43.8  PLT 220  LABPROT 28.9*  INR 2.8*  CREATININE 1.56*  TROPONINIHS 15    Estimated Creatinine Clearance: 41.6 mL/min (A) (by C-G formula based on SCr of 1.56 mg/dL (H)).   Medical History: Past Medical History:  Diagnosis Date  . Acute kidney failure with lesion of tubular necrosis (HCC)   . Acute systolic heart failure ([place])   . CAD (coronary artery disease)    a. s/p prior PCI. b. CABG 2007 at [facility] in [place] 2007. c. inferior STEMI 10/2015 s/p DES to dSVG-PDA.  . Cardiac arrest ([place])   . Cervical cancer ([place])   . Chronic diastolic CHF (congestive heart failure) ([place])   . Chronic respiratory failure ([place])    s/p tracheostomy 2002  . Chronic RUQ pain   . COPD (chronic obstructive pulmonary disease) ([place])   . Diabetes mellitus ([place])   . DVT (deep venous thrombosis) ([place])   . Endometriosis   . History of gallstones 01/2016   seen on Ultrasound  . History  of HIDA scan 11/2016   normal  . HTN (hypertension)   . Hyperlipidemia   . Lupus anticoagulant disorder (HCC)    on coumadin  . Morbid obesity ([place])   . Psoriasis   . ST elevation (STEMI) myocardial infarction involving right coronary artery ([place]) 10/29/15   stent to VG to PDA  . Toe fracture, right 03/29/2018  . Tracheostomy in place [facility]), chronic since 2002 11/03/2015     Assessment: 55 yo female presented on 12/05/2020 with SOB, diarrhea and leg swelling. Patient is on warfarin prior to admission for lupus anticoagulant with history of VTE and Afib.    Prior to admission regimen (per RN who discussed w/ pt): warfarin 2.5mg  daily except 5mg  on Mondays/Fridays. Managed per PCP.   INR 2.8 is therapeutic. Patient reported last dose 3/30. No dose on 3/31 per pt due to "Dr telling her not to take it since she was too thin". Suspect this means patient had a high INR but unable to confirm since no notes available in [facility] or Care Everywhere. Patient also started on azithromycin which can increase INR.   Goal of Therapy:  INR 2-3 Monitor platelets by anticoagulation protocol: Yes   Plan:  Warfarin 2.5mg  x1 tonight  Monitor INR, CBC and s/s of bleeding daily  Cristela Felt, PharmD Clinical Pharmacist  12/05/2020,6:46 PM

## 2020-12-05 NOTE — ED Notes (Signed)
Increased to 60 % on 10 liters oxygen. Patient asleep

## 2020-12-05 NOTE — ED Provider Notes (Signed)
[facility] EMERGENCY DEPARTMENT Provider Note   CSN: [id number] Arrival date & time: 12/05/20  1644     History Chief Complaint  Patient presents with  . Shortness of Breath    Theresa [name] is a 55 y.o. female presenting for evaluation of worsening shortness of breath.  Patient states over the past week she has had diarrhea.  She also reports worsening shortness of breath and leg swelling.  She wears 4 L of oxygen at baseline, states that on her oxygen, sats have been 60 to 70% at home.  As such, her doctor recommended she come to the hospital.  She denies fevers, chills, chest pain, abdominal pain, change in urination or bowel movements.  She is still taking Lasix as prescribed.  She used her inhaler x1 today without significant improvement.  She denies sick contacts.  She is not vaccinated for COVID. Patient reports her trach has been in place since 2002.  Additional history obtained in chart review.  Patient with a history of CAD status post stent, CHF, chronic respiratory failure with trach placement thousand 2, COPD, diabetes, DVT on warfarin, hypertension, lupus, previous STEMI and cardiac arrest.  HPI     Past Medical History:  Diagnosis Date  . Acute kidney failure with lesion of tubular necrosis (HCC)   . Acute systolic heart failure ([place])   . CAD (coronary artery disease)    a. s/p prior PCI. b. CABG 2007 at [facility] in [place] 2007. c. inferior STEMI 10/2015 s/p DES to dSVG-PDA.  . Cardiac arrest ([place])   . Cervical cancer ([place])   . Chronic diastolic CHF (congestive heart failure) ([place])   . Chronic respiratory failure ([place])    s/p tracheostomy 2002  . Chronic RUQ pain   . COPD (chronic obstructive pulmonary disease) ([place])   . Diabetes mellitus ([place])   . DVT (deep venous thrombosis) ([place])   . Endometriosis   . History of gallstones 01/2016   seen on Ultrasound  . History of HIDA scan 11/2016   normal  . HTN (hypertension)   . Hyperlipidemia   .  Lupus anticoagulant disorder (HCC)    on coumadin  . Morbid obesity ([place])   . Psoriasis   . ST elevation (STEMI) myocardial infarction involving right coronary artery ([place]) 10/29/15   stent to VG to PDA  . Toe fracture, right 03/29/2018  . Tracheostomy in place [facility]), chronic since 2002 11/03/2015    Patient Active Problem List   Diagnosis Date Noted  . Rectal bleeding   . Constipation   . Abdominal tenderness without rebound tenderness   . Acute renal failure superimposed on stage 3b chronic kidney disease ([place]) 08/01/2020  . Acute renal failure superimposed on stage 3a chronic kidney disease ([place]) 08/01/2020  . Acute on chronic respiratory failure ([place]) 07/21/2020  . Hypoxemia   . Atrial flutter with rapid ventricular response ([place])   . Sepsis due to Escherichia coli (E. coli) ([place]) 06/16/2020  . Bacteremia due to Gram-negative bacteria 06/16/2020  . Severe sepsis ([place]) 06/15/2020  . Biliary colic 37/12/8887  . Abdominal pain 06/15/2020  . Transaminitis 06/15/2020  . Sepsis due to undetermined organism ([place]) 06/15/2020  . Systolic and diastolic CHF, chronic ([place]) 06/15/2020  . CHF (congestive heart failure) ([place]) 04/18/2020  . Acute respiratory failure ([place]) 04/17/2020  . Chronic venous insufficiency 06/19/2019  . Cholelithiasis 05/07/2019  . Adult body mass index 50.0-59.9 ([place]) 03/22/2019  . Anxiety 03/22/2019  . Low back pain 03/22/2019  .  Cigarette nicotine dependence without complication 13/24/4010  . History of vitamin D deficiency 03/22/2019  . Insomnia 03/22/2019  . Knee pain 03/22/2019  . Localized, primary osteoarthritis of lower leg, unspecified laterality 03/22/2019  . Neck pain 03/22/2019  . Psoriasis 03/22/2019  . Thrombophlebitis of deep veins of lower extremity ([place]) 03/22/2019  . DVT (deep venous thrombosis) ([place]) 03/22/2019  . Fracture of humeral shaft, left, closed 03/22/2019  . Chronic respiratory insufficiency 01/19/2019  . CAD (coronary artery  disease) 01/15/2019  . Educated about COVID-19 virus infection 01/12/2019  . Acute on chronic combined systolic and diastolic HF (heart failure) ([place]) 01/12/2019  . Acute on chronic respiratory failure with hypoxia ([place]) 10/24/2018  . Pulmonary hypertension, unspecified ([place])   . Acute on chronic respiratory failure with hypoxia ([place]) 08/01/2017  . Uncontrolled type 2 diabetes mellitus with hyperglycemia, with long-term current use of insulin ([place]) 08/01/2017  . Dyspnea 07/31/2017  . Nausea & vomiting   . Acute on chronic congestive heart failure ([place])   . Acute respiratory failure with hypoxia ([place]) 01/22/2016  . Hyperglycemia due to diabetes mellitus ([place]) 01/22/2016  . CAD (coronary artery disease) of artery bypass graft: PTCA/DES to distal body VG to PDA 10/29/15 11/03/2015  . Tracheostomy in place [facility]), chronic since 2002 11/03/2015  . Hypokalemia 11/03/2015  . Lupus anticoagulant syndrome ([place])   . Chronic diastolic CHF (congestive heart failure) ([place])   . Respiratory failure ([place])   . Coronary artery disease involving coronary bypass graft of native heart without angina pectoris   . OSA (obstructive sleep apnea)   . COPD exacerbation ([place])   . Tobacco abuse   . DM (diabetes mellitus) ([place]) 06/11/2009  . Hyperlipidemia LDL goal <70 06/11/2009  . Acute thromboembolism of deep veins of lower extremity ([place]) 06/11/2009  . History of CABG 06/11/2009  . Essential hypertension 05/27/2009  . CAD, NATIVE VESSEL 05/27/2009    Past Surgical History:  Procedure Laterality Date  . CARDIAC CATHETERIZATION N/A 10/29/2015   Procedure: Left Heart Cath and Cors/Grafts Angiography;  Surgeon: Burnell Blanks, MD;  Location: [facility];  Service: Cardiovascular;  Laterality: N/A;  . CARDIAC CATHETERIZATION  10/29/2015   Procedure: Coronary Stent Intervention;  Surgeon: Burnell Blanks, MD;  Location: [facility];  Service: Cardiovascular;;  . CAROTID STENT    .  CESAREAN SECTION WITH BILATERAL TUBAL LIGATION    . CORONARY ARTERY BYPASS GRAFT  2007   2V  . IR GASTROSTOMY TUBE MOD SED  11/13/2018  . RIGHT/LEFT HEART CATH AND CORONARY/GRAFT ANGIOGRAPHY N/A 03/24/2018   Procedure: RIGHT/LEFT HEART CATH AND CORONARY/GRAFT ANGIOGRAPHY;  Surgeon: Belva Crome, MD;  Location: [facility];  Service: Cardiovascular;  Laterality: N/A;  . TRACHEOSTOMY       OB History   No obstetric history on file.     Family History  Problem Relation Age of Onset  . Hypertension Mother   . Diabetes Mother   . Hypertension Father   . Diabetes Father   . Colon cancer Neg Hx   . Colon polyps Neg Hx     Social History   Tobacco Use  . Smoking status: Current Every Day Smoker    Packs/day: 0.75    Types: Cigarettes    Start date: 10/23/1978  . Smokeless tobacco: Never Used  . Tobacco comment: 1/2 pack - 1 pack - trying to cut back   Vaping Use  . Vaping Use: Never used  Substance Use Topics  .  Alcohol use: No    Alcohol/week: 0.0 standard drinks  . Drug use: No    Home Medications Prior to Admission medications   Medication Sig Start Date End Date Taking? Authorizing Provider  LORazepam (ATIVAN) 0.5 MG tablet Take 0.5 mg by mouth as needed for anxiety.   Yes [provider]  metolazone (ZAROXOLYN) 5 MG tablet Take 5 mg by mouth. Tuesday & Wednesday   Yes [provider]  mometasone (ELOCON) 0.1 % ointment Apply 1 application topically daily as needed. 06/24/20  Yes [provider]  nitroGLYCERIN (NITROSTAT) 0.4 MG SL tablet Place 0.4 mg under the tongue every 5 (five) minutes as needed for chest pain.   Yes [provider]  NOVOLOG FLEXPEN 100 UNIT/ML FlexPen Inject 1-10 Units into the skin 3 (three) times daily before meals. 04/06/20  Yes [provider]  pantoprazole (PROTONIX) 20 MG tablet Take 20 mg by mouth daily. 02/16/20  Yes [provider]  polyethylene glycol (MIRALAX / GLYCOLAX) 17 g packet  Take 17 g by mouth daily as needed for mild constipation. 06/20/20  Yes Kathie Dike, MD  potassium chloride SA (KLOR-CON) 20 MEQ tablet Take 2 tablets (40 mEq total) by mouth daily. 11/11/20 12/11/20 Yes Verta Ellen., NP  sertraline (ZOLOFT) 50 MG tablet Take 50 mg by mouth at bedtime.    Yes [provider]  tamsulosin (FLOMAX) 0.4 MG CAPS capsule Take 0.4 mg by mouth daily.   Yes [provider]  torsemide (DEMADEX) 20 MG tablet Take 20 mg by mouth daily. 3 tablets in the morning and 3 tablets at bedtime.   Yes [provider]  traMADol (ULTRAM) 50 MG tablet Take 1 tablet (50 mg total) by mouth 3 (three) times daily as needed for moderate pain. 08/07/20  Yes Dessa Phi, DO  warfarin (COUMADIN) 2.5 MG tablet Take 0.5-1 tablets (1.25-2.5 mg total) by mouth See admin instructions. 2.5mg  MWF and 1.25mg  every other day. Patient taking differently: Take 1.125-2.5 mg by mouth See admin instructions. 5mg  on Monday & Friday and 2.5mg  all other days - MANAGED BY PCP 08/07/20  Yes Dessa Phi, DO  albuterol (PROVENTIL) (2.5 MG/3ML) 0.083% nebulizer solution Take 3 mLs (2.5 mg total) by nebulization every 4 (four) hours as needed for wheezing. 07/07/20   Johnson, Clanford L, MD  albuterol (VENTOLIN HFA) 108 (90 Base) MCG/ACT inhaler Inhale 2 puffs into the lungs every 4 (four) hours as needed for wheezing or shortness of breath.    [provider]  atorvastatin (LIPITOR) 20 MG tablet Take 20 mg by mouth daily. 07/27/20   [provider]  digoxin (LANOXIN) 0.125 MG tablet Take 1 tablet (125 mcg total) by mouth daily. Started 10/31/2020 11/11/20   Verta Ellen., NP  gabapentin (NEURONTIN) 300 MG capsule Take 3 capsules (900 mg total) by mouth at bedtime. 07/07/20   Johnson, Clanford L, MD  insulin degludec (TRESIBA FLEXTOUCH) 100 UNIT/ML FlexTouch Pen Inject 30 Units into the skin at bedtime. 07/07/20   Johnson, Clanford L, MD  levothyroxine (SYNTHROID) 50  MCG tablet Take 1 tablet by mouth daily. 10/09/20 11/11/21  [provider]    Allergies    Penicillins, Ciprofloxacin, and Ibuprofen  Review of Systems   Review of Systems  Respiratory: Positive for shortness of breath.   Cardiovascular: Positive for leg swelling.  Gastrointestinal: Positive for diarrhea.  All other systems reviewed and are negative.   Physical Exam Updated Vital Signs BP (!) 111/56  Pulse (!) 56   Temp 97.7 F (36.5 C)   Resp 18   Ht 4\' 10"  (1.473 m)   Wt 100.2 kg   SpO2 98%   BMI 46.19 kg/m   Physical Exam Vitals and nursing note reviewed.  Constitutional:      General: She is not in acute distress.    Appearance: She is obese. She is ill-appearing.     Comments: Appears chronically ill.  HENT:     Head: Normocephalic and atraumatic.  Eyes:     Extraocular Movements: Extraocular movements intact.     Conjunctiva/sclera: Conjunctivae normal.     Pupils: Pupils are equal, round, and reactive to light.  Neck:     Comments: Trach in place without surrounding erythema or drainage.  No blood-streaked sputum noted coming from the trach. Cardiovascular:     Rate and Rhythm: Normal rate and regular rhythm.     Pulses: Normal pulses.  Pulmonary:     Effort: Pulmonary effort is normal. No respiratory distress.     Breath sounds: Normal breath sounds. No wheezing.     Comments: Exam limited due to body habitus and patient having trach causing upper airway sounds.  Limited air movement but no obvious wheezing or rhonchi Significant tachypnea and increased work of breathing with limited exertion such as getting on the bed.  Sats 81% on 4 L via nasal cannula, improved to mid 90s on 8 L. Abdominal:     General: There is no distension.     Palpations: Abdomen is soft. There is no mass.     Tenderness: There is no abdominal tenderness. There is no guarding or rebound.  Musculoskeletal:        General: Normal range of motion.     Cervical back: Normal  range of motion and neck supple.     Right lower leg: Edema present.     Left lower leg: Edema present.     Comments: Bilateral pitting edema.  Skin:    General: Skin is warm and dry.     Capillary Refill: Capillary refill takes less than 2 seconds.     Findings: Rash present.  Neurological:     Mental Status: She is alert and oriented to person, place, and time.     ED Results / Procedures / Treatments   Labs (all labs ordered are listed, but only abnormal results are displayed) Labs Reviewed  CBC WITH DIFFERENTIAL/PLATELET - Abnormal; Notable for the following components:      Result Value   RDW 16.8 (*)    All other components within normal limits  COMPREHENSIVE METABOLIC PANEL - Abnormal; Notable for the following components:   Sodium 132 (*)    Chloride 91 (*)    Glucose, Bld 314 (*)    BUN 34 (*)    Creatinine, Ser 1.56 (*)    Calcium 8.3 (*)    Albumin 2.8 (*)    AST 14 (*)    Alkaline Phosphatase 133 (*)    GFR, Estimated 39 (*)    All other components within normal limits  BRAIN NATRIURETIC PEPTIDE - Abnormal; Notable for the following components:   B Natriuretic Peptide 695.0 (*)    All other components within normal limits  BLOOD GAS, ARTERIAL - Abnormal; Notable for the following components:   pCO2 arterial 48.9 (*)    pO2, Arterial 65.8 (*)    Bicarbonate 29.9 (*)    Acid-Base Excess 6.9 (*)    All other components within  normal limits  PROTIME-INR - Abnormal; Notable for the following components:   Prothrombin Time 28.9 (*)    INR 2.8 (*)    All other components within normal limits  RESP PANEL BY RT-PCR (FLU A&B, COVID) ARPGX2  CULTURE, RESPIRATORY W GRAM STAIN  LIPASE, BLOOD  HEMOGLOBIN A1C  POC SARS CORONAVIRUS 2 AG -  ED  TROPONIN I (HIGH SENSITIVITY)  TROPONIN I (HIGH SENSITIVITY)    EKG None  Radiology DG Chest 1 View  Result Date: 12/05/2020 CLINICAL DATA:  55 year old female with shortness of breath. EXAM: CHEST  1 VIEW COMPARISON:   Chest radiograph dated 10/09/2020. FINDINGS: Tracheostomy with tip above the carina. There is cardiomegaly with vascular congestion. Pneumonia is not excluded. Overall improved congestion compared to the prior radiograph. No focal consolidation or pneumothorax. No large pleural effusion. Median sternotomy wires. No acute osseous pathology. IMPRESSION: Cardiomegaly with vascular congestion. Electronically Signed   By: Anner Crete M.D.   On: 12/05/2020 17:32    Procedures Procedures   Medications Ordered in ED Medications  insulin aspart (novoLOG) injection 0-15 Units (has no administration in time range)  insulin aspart (novoLOG) injection 0-5 Units (has no administration in time range)  azithromycin (ZITHROMAX) 500 mg in sodium chloride 0.9 % 250 mL IVPB (has no administration in time range)  albuterol (PROVENTIL) (2.5 MG/3ML) 0.083% nebulizer solution 5 mg (5 mg Nebulization Given 12/05/20 1814)  ipratropium (ATROVENT) nebulizer solution 0.5 mg (0.5 mg Nebulization Given 12/05/20 1814)  methylPREDNISolone sodium succinate (SOLU-MEDROL) 125 mg/2 mL injection 125 mg (125 mg Intravenous Given 12/05/20 1808)  furosemide (LASIX) injection 40 mg (40 mg Intravenous Given 12/05/20 1808)    ED Course  I have reviewed the triage vital signs and the nursing notes.  Pertinent labs & imaging results that were available during my care of the patient were reviewed by me and considered in my medical decision making (see chart for details).    MDM Rules/Calculators/A&P                          Patient presented for 1 week history of worsening shortness of breath.  Associated diarrhea and leg swelling.  On exam, patient with hypoxia on baseline oxygen.  She has significant tachypnea and increased work of breathing with minimal exertion.  She appears fluid overloaded.  Concern for CHF exacerbation.  Also consider COPD exacerbation.  Less likely PE as patient is on warfarin.  Less likely infection as patient is  without fever or cough.  Will obtain labs, chest x-ray, EKG.  Labs interpreted by me, similar to patient's baseline.  No leukocytosis.  Hemoglobin stable.  Mild elevation in creatinine from baseline, but no doubling.  Patient is hyperglycemic, but no signs of DKA.  ABG overall reassuring.  BMP mildly elevated at 700.  Covid negative.  Troponin negative.  EKG without signs of ischemia.  Patient given Lasix and breathing treatment.  Still requiring more than baseline oxygen.  Will call for admission.  Discussed with Dr. Landis Gandy from triad hospitalist service, pt to be admitted.  Final Clinical Impression(s) / ED Diagnoses Final diagnoses:  Acute on chronic heart failure, unspecified heart failure type [facility])    Rx / DC Orders ED Discharge Orders    None       Franchot Heidelberg, PA-C 12/05/20 1917    Milton Ferguson, MD 12/07/20 1607

## 2020-12-05 NOTE — ED Triage Notes (Signed)
Short of breath for a week, states she has had diarrhea and also has swelling of extremities, patient is oxygen dependent

## 2020-12-06 ENCOUNTER — Inpatient Hospital Stay (HOSPITAL_COMMUNITY): Payer: Medicare Other

## 2020-12-06 DIAGNOSIS — I2510 Atherosclerotic heart disease of native coronary artery without angina pectoris: Secondary | ICD-10-CM

## 2020-12-06 DIAGNOSIS — I5043 Acute on chronic combined systolic (congestive) and diastolic (congestive) heart failure: Secondary | ICD-10-CM | POA: Diagnosis not present

## 2020-12-06 DIAGNOSIS — I361 Nonrheumatic tricuspid (valve) insufficiency: Secondary | ICD-10-CM | POA: Diagnosis not present

## 2020-12-06 DIAGNOSIS — R9431 Abnormal electrocardiogram [ECG] [EKG]: Secondary | ICD-10-CM | POA: Diagnosis not present

## 2020-12-06 DIAGNOSIS — J9621 Acute and chronic respiratory failure with hypoxia: Secondary | ICD-10-CM | POA: Diagnosis not present

## 2020-12-06 DIAGNOSIS — I82563 Chronic embolism and thrombosis of calf muscular vein, bilateral: Secondary | ICD-10-CM

## 2020-12-06 DIAGNOSIS — I4892 Unspecified atrial flutter: Secondary | ICD-10-CM | POA: Diagnosis not present

## 2020-12-06 LAB — CBG MONITORING, ED: Glucose-Capillary: 311 mg/dL — ABNORMAL HIGH (ref 70–99)

## 2020-12-06 LAB — COMPREHENSIVE METABOLIC PANEL
ALT: 21 U/L (ref 0–44)
AST: 11 U/L — ABNORMAL LOW (ref 15–41)
Albumin: 2.6 g/dL — ABNORMAL LOW (ref 3.5–5.0)
Alkaline Phosphatase: 122 U/L (ref 38–126)
Anion gap: 14 (ref 5–15)
BUN: 36 mg/dL — ABNORMAL HIGH (ref 6–20)
CO2: 33 mmol/L — ABNORMAL HIGH (ref 22–32)
Calcium: 8.3 mg/dL — ABNORMAL LOW (ref 8.9–10.3)
Chloride: 88 mmol/L — ABNORMAL LOW (ref 98–111)
Creatinine, Ser: 1.48 mg/dL — ABNORMAL HIGH (ref 0.44–1.00)
GFR, Estimated: 42 mL/min — ABNORMAL LOW (ref >60)
Glucose, Bld: 348 mg/dL — ABNORMAL HIGH (ref 70–99)
Potassium: 4.3 mmol/L (ref 3.5–5.1)
Sodium: 135 mmol/L (ref 135–145)
Total Bilirubin: 0.9 mg/dL (ref 0.3–1.2)
Total Protein: 6.4 g/dL — ABNORMAL LOW (ref 6.5–8.1)

## 2020-12-06 LAB — BLOOD GAS, ARTERIAL
Acid-Base Excess: 10.8 mmol/L — ABNORMAL HIGH (ref 0.0–2.0)
Bicarbonate: 33.3 mmol/L — ABNORMAL HIGH (ref 20.0–28.0)
FIO2: 60
O2 Saturation: 93.8 %
Patient temperature: 36.6
pCO2 arterial: 52.2 mmHg — ABNORMAL HIGH (ref 32.0–48.0)
pH, Arterial: 7.445 (ref 7.350–7.450)
pO2, Arterial: 68.6 mmHg — ABNORMAL LOW (ref 83.0–108.0)

## 2020-12-06 LAB — ECHOCARDIOGRAM LIMITED
Area-P 1/2: 2.91 cm2
Height: 58 in
S' Lateral: 3.1 cm
Weight: 3541.47 oz

## 2020-12-06 LAB — GLUCOSE, CAPILLARY
Glucose-Capillary: 373 mg/dL — ABNORMAL HIGH (ref 70–99)
Glucose-Capillary: 375 mg/dL — ABNORMAL HIGH (ref 70–99)
Glucose-Capillary: 459 mg/dL — ABNORMAL HIGH (ref 70–99)
Glucose-Capillary: 208 mg/dL — ABNORMAL HIGH (ref 70–99)
Glucose-Capillary: 199 mg/dL — ABNORMAL HIGH (ref 70–99)

## 2020-12-06 LAB — CBC
HCT: 41.5 % (ref 36.0–46.0)
Hemoglobin: 12.8 g/dL (ref 12.0–15.0)
MCH: 29.4 pg (ref 26.0–34.0)
MCHC: 30.8 g/dL (ref 30.0–36.0)
MCV: 95.4 fL (ref 80.0–100.0)
Platelets: 203 10*3/uL (ref 150–400)
RBC: 4.35 MIL/uL (ref 3.87–5.11)
RDW: 16.7 % — ABNORMAL HIGH (ref 11.5–15.5)
WBC: 6 10*3/uL (ref 4.0–10.5)
nRBC: 0 % (ref 0.0–0.2)

## 2020-12-06 LAB — PROTIME-INR
INR: 2.6 — ABNORMAL HIGH (ref 0.8–1.2)
Prothrombin Time: 27.2 seconds — ABNORMAL HIGH (ref 11.4–15.2)

## 2020-12-06 LAB — MRSA PCR SCREENING: MRSA by PCR: NEGATIVE

## 2020-12-06 MED ORDER — METHYLPREDNISOLONE SODIUM SUCC 40 MG IJ SOLR
40.0000 mg | Freq: Four times a day (QID) | INTRAMUSCULAR | Status: AC
  Administered 2020-12-06 (×4): 40 mg via INTRAVENOUS
  Filled 2020-12-06 (×4): qty 1

## 2020-12-06 MED ORDER — INSULIN GLARGINE 100 UNIT/ML ~~LOC~~ SOLN
30.0000 [IU] | Freq: Every day | SUBCUTANEOUS | Status: DC
  Administered 2020-12-06: 30 [IU] via SUBCUTANEOUS
  Filled 2020-12-06 (×3): qty 0.3

## 2020-12-06 MED ORDER — LORAZEPAM 0.5 MG PO TABS: 0.5000 mg | ORAL_TABLET | Freq: Every day | ORAL | Status: DC | PRN

## 2020-12-06 MED ORDER — PREDNISONE 20 MG PO TABS
40.0000 mg | ORAL_TABLET | Freq: Every day | ORAL | Status: AC
  Administered 2020-12-07 – 2020-12-10 (×4): 40 mg via ORAL
  Filled 2020-12-06 (×4): qty 2

## 2020-12-06 MED ORDER — GABAPENTIN 300 MG PO CAPS
900.0000 mg | ORAL_CAPSULE | Freq: Every day | ORAL | Status: DC
  Administered 2020-12-06: 900 mg via ORAL
  Filled 2020-12-06: qty 3

## 2020-12-06 MED ORDER — INSULIN DEGLUDEC 100 UNIT/ML ~~LOC~~ SOPN: 30.0000 [IU] | PEN_INJECTOR | Freq: Every day | SUBCUTANEOUS | Status: DC

## 2020-12-06 MED ORDER — ATORVASTATIN CALCIUM 20 MG PO TABS
20.0000 mg | ORAL_TABLET | Freq: Every day | ORAL | Status: DC
  Administered 2020-12-06 – 2020-12-12 (×7): 20 mg via ORAL
  Filled 2020-12-06 (×7): qty 1

## 2020-12-06 MED ORDER — INSULIN GLARGINE 100 UNIT/ML ~~LOC~~ SOLN
25.0000 [IU] | Freq: Two times a day (BID) | SUBCUTANEOUS | Status: DC
  Administered 2020-12-06 – 2020-12-07 (×4): 25 [IU] via SUBCUTANEOUS
  Filled 2020-12-06 (×7): qty 0.25

## 2020-12-06 MED ORDER — NITROGLYCERIN 0.4 MG SL SUBL: 0.4000 mg | SUBLINGUAL_TABLET | SUBLINGUAL | Status: DC | PRN

## 2020-12-06 MED ORDER — TRAMADOL HCL 50 MG PO TABS
50.0000 mg | ORAL_TABLET | Freq: Three times a day (TID) | ORAL | Status: DC | PRN
  Administered 2020-12-08 – 2020-12-10 (×2): 50 mg via ORAL
  Filled 2020-12-06 (×2): qty 1

## 2020-12-06 MED ORDER — PERFLUTREN LIPID MICROSPHERE
1.0000 mL | INTRAVENOUS | Status: AC | PRN
  Administered 2020-12-06: 1 mL via INTRAVENOUS
  Filled 2020-12-06: qty 10

## 2020-12-06 MED ORDER — ACETAMINOPHEN 325 MG PO TABS: 650.0000 mg | ORAL_TABLET | Freq: Four times a day (QID) | ORAL | Status: DC | PRN

## 2020-12-06 MED ORDER — TAMSULOSIN HCL 0.4 MG PO CAPS
0.4000 mg | ORAL_CAPSULE | Freq: Every day | ORAL | Status: DC
  Administered 2020-12-06 – 2020-12-12 (×7): 0.4 mg via ORAL
  Filled 2020-12-06 (×8): qty 1

## 2020-12-06 MED ORDER — SERTRALINE HCL 50 MG PO TABS
50.0000 mg | ORAL_TABLET | Freq: Every day | ORAL | Status: DC
  Administered 2020-12-06 – 2020-12-11 (×6): 50 mg via ORAL
  Filled 2020-12-06 (×6): qty 1

## 2020-12-06 MED ORDER — IPRATROPIUM-ALBUTEROL 0.5-2.5 (3) MG/3ML IN SOLN
3.0000 mL | Freq: Four times a day (QID) | RESPIRATORY_TRACT | Status: DC
  Administered 2020-12-06 – 2020-12-07 (×8): 3 mL via RESPIRATORY_TRACT
  Filled 2020-12-06 (×8): qty 3

## 2020-12-06 MED ORDER — PANTOPRAZOLE SODIUM 40 MG PO TBEC
40.0000 mg | DELAYED_RELEASE_TABLET | Freq: Every day | ORAL | Status: DC
  Administered 2020-12-06 – 2020-12-12 (×7): 40 mg via ORAL
  Filled 2020-12-06 (×7): qty 1

## 2020-12-06 MED ORDER — CHLORHEXIDINE GLUCONATE CLOTH 2 % EX PADS
6.0000 | MEDICATED_PAD | Freq: Every day | CUTANEOUS | Status: DC
  Administered 2020-12-09 – 2020-12-10 (×2): 6 via TOPICAL

## 2020-12-06 MED ORDER — WARFARIN SODIUM 2.5 MG PO TABS
2.5000 mg | ORAL_TABLET | Freq: Once | ORAL | Status: AC
  Administered 2020-12-06: 2.5 mg via ORAL
  Filled 2020-12-06: qty 1

## 2020-12-06 MED ORDER — DIGOXIN 125 MCG PO TABS
125.0000 ug | ORAL_TABLET | Freq: Every day | ORAL | Status: DC
  Administered 2020-12-06 – 2020-12-09 (×3): 125 ug via ORAL
  Filled 2020-12-06 (×7): qty 1

## 2020-12-06 MED ORDER — GUAIFENESIN-DM 100-10 MG/5ML PO SYRP
10.0000 mL | ORAL_SOLUTION | Freq: Three times a day (TID) | ORAL | Status: DC
  Administered 2020-12-06 – 2020-12-12 (×19): 10 mL via ORAL
  Filled 2020-12-06 (×20): qty 10

## 2020-12-06 MED ORDER — ACETAMINOPHEN 650 MG RE SUPP: 650.0000 mg | Freq: Four times a day (QID) | RECTAL | Status: DC | PRN

## 2020-12-06 MED ORDER — INSULIN ASPART 100 UNIT/ML ~~LOC~~ SOLN
10.0000 [IU] | Freq: Once | SUBCUTANEOUS | Status: AC
  Administered 2020-12-06: 10 [IU] via SUBCUTANEOUS

## 2020-12-06 MED ORDER — LEVOTHYROXINE SODIUM 25 MCG PO TABS
50.0000 ug | ORAL_TABLET | Freq: Every day | ORAL | Status: DC
  Administered 2020-12-06 – 2020-12-10 (×5): 50 ug via ORAL
  Filled 2020-12-06 (×5): qty 2

## 2020-12-06 MED ORDER — FUROSEMIDE 10 MG/ML IJ SOLN
60.0000 mg | Freq: Three times a day (TID) | INTRAMUSCULAR | Status: DC
  Administered 2020-12-06 – 2020-12-07 (×4): 60 mg via INTRAVENOUS
  Filled 2020-12-06 (×5): qty 6

## 2020-12-06 MED ORDER — POTASSIUM CHLORIDE CRYS ER 20 MEQ PO TBCR
40.0000 meq | EXTENDED_RELEASE_TABLET | Freq: Every day | ORAL | Status: DC
  Administered 2020-12-06 – 2020-12-12 (×7): 40 meq via ORAL
  Filled 2020-12-06 (×7): qty 2

## 2020-12-06 MED ORDER — POLYETHYLENE GLYCOL 3350 17 G PO PACK: 17.0000 g | PACK | Freq: Every day | ORAL | Status: DC | PRN

## 2020-12-06 MED ORDER — METOLAZONE 5 MG PO TABS
5.0000 mg | ORAL_TABLET | ORAL | Status: DC
Start: 2020-12-06 — End: 2020-12-09
  Administered 2020-12-06 – 2020-12-09 (×2): 5 mg via ORAL
  Filled 2020-12-06 (×2): qty 1

## 2020-12-06 NOTE — Progress Notes (Signed)
ANTICOAGULATION CONSULT NOTE -  Pharmacy Consult for Warfarin Indication: VTE Treatment - Continuing PTA  Allergies  Allergen Reactions  . Penicillins Rash    Has patient had a PCN reaction causing immediate rash, facial/tongue/throat swelling, SOB or lightheadedness with hypotension: Yes Has patient had a PCN reaction causing severe rash involving mucus membranes or skin necrosis: No Has patient had a PCN reaction that required hospitalization No Has patient had a PCN reaction occurring within the last 10 years: No If all of the above answers are "NO", then may proceed with Cephalosporin use.   REACTION: rash Pt has tolerated cefepime in the past.  . Ciprofloxacin     nausea  . Ibuprofen Rash    swelling in leg     Patient Measurements: Height: 4\' 10"  (147.3 cm) Weight: 100.4 kg (221 lb 5.5 oz) IBW/kg (Calculated) : 40.9  Vital Signs: Temp: 98.3 F (36.8 C) (04/02 0800) Temp Source: Oral (04/02 0800) BP: 115/66 (04/02 0900) Pulse Rate: 70 (04/02 0900)  Labs: Recent Labs    12/05/20 1714 12/05/20 1856 12/06/20 0451  HGB 13.4  --  12.8  HCT 43.8  --  41.5  PLT 220  --  203  LABPROT 28.9*  --  27.2*  INR 2.8*  --  2.6*  CREATININE 1.56*  --  1.48*  TROPONINIHS 15 14  --     Estimated Creatinine Clearance: 43.9 mL/min (A) (by C-G formula based on SCr of 1.48 mg/dL (H)).   Medical History: Past Medical History:  Diagnosis Date  . Acute kidney failure with lesion of tubular necrosis (HCC)   . Acute systolic heart failure ([place])   . CAD (coronary artery disease)    a. s/p prior PCI. b. CABG 2007 at [facility] in [place] 2007. c. inferior STEMI 10/2015 s/p DES to dSVG-PDA.  . Cardiac arrest ([place])   . Cervical cancer ([place])   . Chronic diastolic CHF (congestive heart failure) ([place])   . Chronic respiratory failure ([place])    s/p tracheostomy 2002  . Chronic RUQ pain   . COPD (chronic obstructive pulmonary disease) ([place])   . Diabetes mellitus ([place])   .  DVT (deep venous thrombosis) ([place])   . Endometriosis   . History of gallstones 01/2016   seen on Ultrasound  . History of HIDA scan 11/2016   normal  . HTN (hypertension)   . Hyperlipidemia   . Lupus anticoagulant disorder (HCC)    on coumadin  . Morbid obesity ([place])   . Psoriasis   . ST elevation (STEMI) myocardial infarction involving right coronary artery ([place]) 10/29/15   stent to VG to PDA  . Toe fracture, right 03/29/2018  . Tracheostomy in place [facility]), chronic since 2002 11/03/2015     Assessment: 55 yo female presented on 12/05/2020 with SOB, diarrhea and leg swelling. Patient is on warfarin prior to admission for lupus anticoagulant with history of VTE and Afib.    Prior to admission regimen (per RN who discussed w/ pt): warfarin 2.5mg  daily except 5mg  on Mondays/Fridays. Managed per PCP.   INR 2.8 is therapeutic. Patient reported last dose 3/30. No dose on 3/31 per pt due to "Dr telling her not to take it since she was too thin". Suspect this means patient had a high INR but unable to confirm since no notes available in [facility] or Care Everywhere. Patient also started on azithromycin which can increase INR.   4/2 INR 2.6   Goal of Therapy:  INR 2-3 Monitor  platelets by anticoagulation protocol: Yes   Plan:  Warfarin 2.5mg  x1 today Monitor INR, CBC and s/s of bleeding daily  Isac Sarna, BS Vena Austria, BCPS Clinical Pharmacist Pager [phone]  12/06/2020,10:10 AM

## 2020-12-06 NOTE — Progress Notes (Signed)
PROGRESS NOTE    Patient: Theresa [name]                            PCP: Monico Blitz, MD                    DOB: 1966-01-30            DOA: 12/05/2020 ZOX:[id number]             DOS: 12/06/2020, 8:05 AM   LOS: 1 day   Date of Service: The patient was seen and examined on 12/06/2020  Subjective:   The patient was seen and examined this morning. Stable at this time, and on 10 L of oxygen via trach Still complaining of shortness of breath, no  chest pain Hemodynamically stable   Brief Narrative:  Falesha Schommer  is a 55 y.o. female, with systolic and diastolic CHF, COPD, continued tobacco abuse, diabetes mellitus type 2, lupus anticoagulant syndrome, coronary artery disease with history of STEMI, hypertension, chronic respiratory failure on 4 L, chronic tracheostomy (since 2002). -Patient presents today secondary to complaints of shortness of breath, over the last week, she reports worsening dyspnea, leg swelling, per EMS she was 70% on her baseline 4 L nasal cannula, in ED she was 82% on 4 L, she is currently requiring ATC at 28% (10 L), denies fever, chills, chest pain, abdominal pain, no dysuria or polyuria, reports she has been taking torsemide as instructed, reports she has been compliant with her low-salt diet as well, . -In ED her chest x-ray significant cardiomegaly with vascular congestion, her BNP elevated at 195, glucose elevated at 314, she was wheezing, dyspnea has improved with IV Lasix and IV Solu-Medrol, [facility] consulted to admit.   Assessment & Plan:   Active Problems:   DM (diabetes mellitus) (HCC)   COPD exacerbation (HCC)   Acute on chronic congestive heart failure (HCC)   Acute on chronic combined systolic and diastolic HF (heart failure) (HCC)   CAD (coronary artery disease)   Cigarette nicotine dependence without complication   DVT (deep venous thrombosis) (HCC)   Atrial flutter with rapid ventricular response (HCC)   Acute on chronic respiratory failure  (HCC)   Acute on chronic hypoxic respiratory failure-with history of COPD/CHF -Still complaining shortness of breath -She is on 4 L nasal cannula at baseline,  -Multifactorial likely COPD and CHF exacerbation 10 L of oxygen via tracheostomy, satting 94%, FiO2 60%, ABG 7.44, PO2 68.2, PCO2 52.2, bicarb 38.3 -We will continue to treat underlying COPD and CHF, -Continuing IV steroids, IV Lasix, monitoring I's and O's (-1400 ),    -RT assisting with tapering down patient to baseline -Continue pulmonary toiletry -Bronchodilator treatments -wean Oxygen as tolerated.   COPD exacerbation -Treatment as above,  - pulmonary toiletry, DuoNeb bronchodilator treatment as needed and scheduled -IV Solu-Medrol -Crease trach secretion, mucolytic's, -Empiric antibiotics with doxycycline initiated will be continued    Acute on chronic systolic/diastolic CHF -Remains in respite distress, management as above --04/20/2020 echo EF 81-19%, grade 3 diastolic dysfunction  -14/78/29 echo EF 50%, global HK, indeterminant diastolic -holding bumex today and reeval to restar  -Reports she has been compliant with torsemide, blood pressure remained soft, -Been started on IV Lasix 40 mg every 8 hours will be continued for today -Monitoring I's and O's ( -1400),  -- Daily weight 100.2-100.4 kg >> -Echocardiogram  (low voltage EKG-repeating echo to rule out other pathologies  aside from CHF)>>> -Following labs, proBNP daily -BNP 695   Lupus anticoagulant syndrome/ -Continue prednisone -Currently stable on 5 L -Creatinine 1.48, monitoring   Atrial flutter -Remain on telemetry -Currently in sinus rhythm -Continue amiodarone, digoxin, Toprol-XL, Coumadin   Lupus anticoagulant syndrome -coumadin with pharmacy assistance  Coronary artery disease -No chest pain presently -Continue current meds   Uncontrolled diabetes mellitus type II  with hyperglycemia -holding lantus  temporarily -NovoLog sliding scale -10/11-hemoglobin A1c--8.9 -holding Ozempic -Increasing Lantus from 30 daily to 25u BID-  anticipating hyperglycemia due to steroids  Depression -Continue sertraline and home dose lorazepam   Lupus anticoagulant syndrome,history of DVT on chronic anticoagulation -coumadin with pharmacy assistance   GERD -Continue with PPI  Hypothyroidism -Continue with levothyroxine    ----------------------------------------------------------------------------------------------------------------------------------------- Nutritional status:  The patient's BMI is: Body mass index is 46.26 kg/m. I agree with the assessment and plan as outlined  Skin Assessment: I have examined the patient's skin and I agree with the wound assessment as performed by wound care team As outlined     ---------------------------------------------------------------------------------------------------------------------------------------- Cultures; Sputum culture >>>  Influenza A/B negative SARS-CoV-2 negative  Antimicrobials: P.o. doxycycline   Consultants: NONE    -----------------------------------------------------------------------------------------------------------------------------------------  DVT prophylaxis:  SCD/Compression stockings and JO:INOMVEHM Code Status:   Code Status: Full Code  Family Communication:  No family member present at bedside- attempt will be made to update daily The above findings and plan of care has been discussed with patient (and family)  in detail,  they expressed understanding and agreement of above. -Advance care planning has been discussed.   Admission status:   Status is: Inpatient  Remains inpatient appropriate because:Inpatient level of care appropriate due to severity of illness   Dispo: The patient is from: Home              Anticipated d/c is to: Home w HH in > 3 days               Patient currently is  not medically stable to d/c.   Difficult to place patient No      Level of care: Stepdown   Procedures:   No admission procedures for hospital encounter.     Antimicrobials:  Anti-infectives (From admission, onward)   Start     Dose/Rate Route Frequency Ordered Stop   12/05/20 1930  doxycycline (VIBRA-TABS) tablet 100 mg        100 mg Oral Every 12 hours 12/05/20 1920     12/05/20 1900  azithromycin (ZITHROMAX) 500 mg in sodium chloride 0.9 % 250 mL IVPB  Status:  Discontinued        500 mg 250 mL/hr over 60 Minutes Intravenous Every 24 hours 12/05/20 1848 12/05/20 1920       Medication:  . atorvastatin  20 mg Oral Daily  . Chlorhexidine Gluconate Cloth  6 each Topical Daily  . digoxin  125 mcg Oral Daily  . doxycycline  100 mg Oral Q12H  . furosemide  60 mg Intravenous Q8H  . gabapentin  900 mg Oral QHS  . guaiFENesin-dextromethorphan  10 mL Oral Q8H  . insulin aspart  0-15 Units Subcutaneous TID WC  . insulin aspart  0-5 Units Subcutaneous QHS  . insulin glargine  30 Units Subcutaneous QHS  . ipratropium-albuterol  3 mL Nebulization Q6H  . levothyroxine  50 mcg Oral Q0600  . methylPREDNISolone (SOLU-MEDROL) injection  40 mg Intravenous Q6H   Followed by  . [START ON 12/07/2020] predniSONE  40 mg Oral Q  breakfast  . metolazone  5 mg Oral Q72H  . metoprolol succinate  25 mg Oral Daily  . mometasone  1 application Topical Daily  . pantoprazole  40 mg Oral Daily  . potassium chloride SA  40 mEq Oral Daily  . sertraline  50 mg Oral QHS  . tamsulosin  0.4 mg Oral Daily  . Warfarin - Pharmacist Dosing Inpatient   Does not apply q1600    acetaminophen **OR** acetaminophen, albuterol, LORazepam, nitroGLYCERIN, polyethylene glycol, traMADol   Objective:   Vitals:   12/06/20 0400 12/06/20 0500 12/06/20 0600 12/06/20 0700  BP: 115/65 (!) 110/47 (!) 114/50 (!) 112/50  Pulse: 62 64 74 69  Resp: (!) 28 20 15 18   Temp: 98.1 F (36.7 C)     TempSrc: Oral     SpO2:  96% 92% 96% 95%  Weight:      Height:        Intake/Output Summary (Last 24 hours) at 12/06/2020 0805 Last data filed at 12/06/2020 0600 Gross per 24 hour  Intake --  Output 1400 ml  Net -1400 ml   Filed Weights   12/05/20 1707 12/06/20 0045  Weight: 100.2 kg 100.4 kg     Examination:   Physical Exam  Constitution:  Alert, cooperative, no distress, SOB, high flow O2 via trach Psychiatric: Normal and stable mood and affect, cognition intact,   HEENT: Normocephalic, PERRL, otherwise with in Normal limits  Chest:Chest symmetric Cardio vascular:  S1/S2, RRR, No murmure, No Rubs or Gallops  pulmonary: Diffuse Rales/rhonchi, shortness of breath, O2 via trach, minimal crackles at the bases bilaterally Abdomen: Soft, non-tender, non-distended, bowel sounds,no masses, no organomegaly Muscular skeletal: Limited exam - in bed, able to move all 4 extremities, Normal strength,  Neuro: CNII-XII intact. , normal motor and sensation, reflexes intact  Extremities: No pitting edema lower extremities, +2 pulses  Skin: Dry, warm to touch, negative for any Rashes, No open wounds Wounds: per nursing documentation    ------------------------------------------------------------------------------------------------------------------------------------------    LABs:  CBC Latest Ref Rng & Units 12/06/2020 12/05/2020 08/07/2020  WBC 4.0 - 10.5 K/uL 6.0 8.0 14.3(H)  Hemoglobin 12.0 - 15.0 g/dL 12.8 13.4 15.2(H)  Hematocrit 36.0 - 46.0 % 41.5 43.8 47.7(H)  Platelets 150 - 400 K/uL 203 220 285   CMP Latest Ref Rng & Units 12/06/2020 12/05/2020 08/07/2020  Glucose 70 - 99 mg/dL 348(H) 314(H) 113(H)  BUN 6 - 20 mg/dL 36(H) 34(H) 58(H)  Creatinine 0.44 - 1.00 mg/dL 1.48(H) 1.56(H) 1.20(H)  Sodium 135 - 145 mmol/L 135 132(L) 136  Potassium 3.5 - 5.1 mmol/L 4.3 4.3 3.0(L)  Chloride 98 - 111 mmol/L 88(L) 91(L) 83(L)  CO2 22 - 32 mmol/L 33(H) 30 40(H)  Calcium 8.9 - 10.3 mg/dL 8.3(L) 8.3(L) 8.8(L)  Total Protein  6.5 - 8.1 g/dL 6.4(L) 6.9 -  Total Bilirubin 0.3 - 1.2 mg/dL 0.9 0.8 -  Alkaline Phos 38 - 126 U/L 122 133(H) -  AST 15 - 41 U/L 11(L) 14(L) -  ALT 0 - 44 U/L 21 23 -       Micro Results Recent Results (from the past 240 hour(s))  Resp Panel by RT-PCR (Flu A&B, Covid) Nasopharyngeal Swab     Status: None   Collection Time: 12/05/20  5:58 PM   Specimen: Nasopharyngeal Swab; Nasopharyngeal(NP) swabs in vial transport medium  Result Value Ref Range Status   SARS Coronavirus 2 by RT PCR NEGATIVE NEGATIVE Final    Comment: (NOTE) SARS-CoV-2 target nucleic acids are  NOT DETECTED.  The SARS-CoV-2 RNA is generally detectable in upper respiratory specimens during the acute phase of infection. The lowest concentration of SARS-CoV-2 viral copies this assay can detect is 138 copies/mL. A negative result does not preclude SARS-Cov-2 infection and should not be used as the sole basis for treatment or other patient management decisions. A negative result may occur with  improper specimen collection/handling, submission of specimen other than nasopharyngeal swab, presence of viral mutation(s) within the areas targeted by this assay, and inadequate number of viral copies(<138 copies/mL). A negative result must be combined with clinical observations, patient history, and epidemiological information. The expected result is Negative.  Fact Sheet for Patients:  EntrepreneurPulse.com.au  Fact Sheet for Healthcare Providers:  IncredibleEmployment.be  This test is no t yet approved or cleared by the [place] FDA and  has been authorized for detection and/or diagnosis of SARS-CoV-2 by FDA under an Emergency Use Authorization (EUA). This EUA will remain  in effect (meaning this test can be used) for the duration of the COVID-19 declaration under Section 564(b)(1) of the Act, 21 U.S.C.section 360bbb-3(b)(1), unless the authorization is terminated  or revoked  sooner.       Influenza A by PCR NEGATIVE NEGATIVE Final   Influenza B by PCR NEGATIVE NEGATIVE Final    Comment: (NOTE) The Xpert Xpress SARS-CoV-2/FLU/RSV plus assay is intended as an aid in the diagnosis of influenza from Nasopharyngeal swab specimens and should not be used as a sole basis for treatment. Nasal washings and aspirates are unacceptable for Xpert Xpress SARS-CoV-2/FLU/RSV testing.  Fact Sheet for Patients: EntrepreneurPulse.com.au  Fact Sheet for Healthcare Providers: IncredibleEmployment.be  This test is not yet approved or cleared by the [place] FDA and has been authorized for detection and/or diagnosis of SARS-CoV-2 by FDA under an Emergency Use Authorization (EUA). This EUA will remain in effect (meaning this test can be used) for the duration of the COVID-19 declaration under Section 564(b)(1) of the Act, 21 U.S.C. section 360bbb-3(b)(1), unless the authorization is terminated or revoked.  Performed at [facility], [address], [place], [place] [address]   MRSA PCR Screening     Status: None   Collection Time: 12/06/20 12:40 AM   Specimen: Nasal Mucosa; Nasopharyngeal  Result Value Ref Range Status   MRSA by PCR NEGATIVE NEGATIVE Final    Comment:        The GeneXpert MRSA Assay (FDA approved for NASAL specimens only), is one component of a comprehensive MRSA colonization surveillance program. It is not intended to diagnose MRSA infection nor to guide or monitor treatment for MRSA infections. Performed at [facility], [address], [place],  [address]     Radiology Reports DG Chest 1 View  Result Date: 12/05/2020 CLINICAL DATA:  55 year old female with shortness of breath. EXAM: CHEST  1 VIEW COMPARISON:  Chest radiograph dated 10/09/2020. FINDINGS: Tracheostomy with tip above the carina. There is cardiomegaly with vascular congestion. Pneumonia is not excluded. Overall improved congestion  compared to the prior radiograph. No focal consolidation or pneumothorax. No large pleural effusion. Median sternotomy wires. No acute osseous pathology. IMPRESSION: Cardiomegaly with vascular congestion. Electronically Signed   By: Anner Crete M.D.   On: 12/05/2020 17:32    SIGNED: Deatra James, MD, FHM. [facility],  Pager (please use [email or web address] to page/text) Please use Epic Secure Chat for non-urgent communication (7AM-7PM)  If 7PM-7AM, please contact night-coverage [email or web address], 12/06/2020, 8:05 AM

## 2020-12-06 NOTE — Progress Notes (Signed)
*  PRELIMINARY RESULTS* Echocardiogram 2D Echocardiogram has been performed.  Theresa [name] 12/06/2020, 11:27 AM

## 2020-12-06 NOTE — Plan of Care (Signed)
  Problem: Cardiac: Goal: Ability to achieve and maintain adequate cardiopulmonary perfusion will improve Outcome: Progressing   Problem: Safety: Goal: Ability to remain free from injury will improve Outcome: Progressing   

## 2020-12-07 ENCOUNTER — Inpatient Hospital Stay (HOSPITAL_COMMUNITY): Payer: Medicare Other

## 2020-12-07 DIAGNOSIS — E1140 Type 2 diabetes mellitus with diabetic neuropathy, unspecified: Secondary | ICD-10-CM

## 2020-12-07 DIAGNOSIS — I5043 Acute on chronic combined systolic (congestive) and diastolic (congestive) heart failure: Secondary | ICD-10-CM | POA: Diagnosis not present

## 2020-12-07 DIAGNOSIS — I2510 Atherosclerotic heart disease of native coronary artery without angina pectoris: Secondary | ICD-10-CM | POA: Diagnosis not present

## 2020-12-07 DIAGNOSIS — I4892 Unspecified atrial flutter: Secondary | ICD-10-CM | POA: Diagnosis not present

## 2020-12-07 DIAGNOSIS — J9621 Acute and chronic respiratory failure with hypoxia: Secondary | ICD-10-CM | POA: Diagnosis not present

## 2020-12-07 DIAGNOSIS — Z794 Long term (current) use of insulin: Secondary | ICD-10-CM

## 2020-12-07 LAB — CBC
HCT: 41.1 % (ref 36.0–46.0)
Hemoglobin: 12.7 g/dL (ref 12.0–15.0)
MCH: 29.7 pg (ref 26.0–34.0)
MCHC: 30.9 g/dL (ref 30.0–36.0)
MCV: 96.3 fL (ref 80.0–100.0)
Platelets: 224 10*3/uL (ref 150–400)
RBC: 4.27 MIL/uL (ref 3.87–5.11)
RDW: 16.7 % — ABNORMAL HIGH (ref 11.5–15.5)
WBC: 12 10*3/uL — ABNORMAL HIGH (ref 4.0–10.5)
nRBC: 0 % (ref 0.0–0.2)

## 2020-12-07 LAB — GLUCOSE, CAPILLARY
Glucose-Capillary: 238 mg/dL — ABNORMAL HIGH (ref 70–99)
Glucose-Capillary: 346 mg/dL — ABNORMAL HIGH (ref 70–99)
Glucose-Capillary: 337 mg/dL — ABNORMAL HIGH (ref 70–99)
Glucose-Capillary: 147 mg/dL — ABNORMAL HIGH (ref 70–99)

## 2020-12-07 LAB — COMPREHENSIVE METABOLIC PANEL
ALT: 19 U/L (ref 0–44)
AST: 11 U/L — ABNORMAL LOW (ref 15–41)
Albumin: 2.6 g/dL — ABNORMAL LOW (ref 3.5–5.0)
Alkaline Phosphatase: 102 U/L (ref 38–126)
Anion gap: 13 (ref 5–15)
BUN: 36 mg/dL — ABNORMAL HIGH (ref 6–20)
CO2: 37 mmol/L — ABNORMAL HIGH (ref 22–32)
Calcium: 8.2 mg/dL — ABNORMAL LOW (ref 8.9–10.3)
Chloride: 87 mmol/L — ABNORMAL LOW (ref 98–111)
Creatinine, Ser: 1.44 mg/dL — ABNORMAL HIGH (ref 0.44–1.00)
GFR, Estimated: 43 mL/min — ABNORMAL LOW (ref >60)
Glucose, Bld: 227 mg/dL — ABNORMAL HIGH (ref 70–99)
Potassium: 3.3 mmol/L — ABNORMAL LOW (ref 3.5–5.1)
Sodium: 137 mmol/L (ref 135–145)
Total Bilirubin: 0.8 mg/dL (ref 0.3–1.2)
Total Protein: 6.4 g/dL — ABNORMAL LOW (ref 6.5–8.1)

## 2020-12-07 LAB — URINALYSIS, ROUTINE W REFLEX MICROSCOPIC
Bilirubin Urine: NEGATIVE
Glucose, UA: NEGATIVE mg/dL
Hgb urine dipstick: NEGATIVE
Ketones, ur: NEGATIVE mg/dL
Leukocytes,Ua: NEGATIVE
Nitrite: NEGATIVE
Protein, ur: NEGATIVE mg/dL
Specific Gravity, Urine: 1.006 (ref 1.005–1.030)
pH: 5 (ref 5.0–8.0)

## 2020-12-07 LAB — PROTIME-INR
INR: 3.1 — ABNORMAL HIGH (ref 0.8–1.2)
Prothrombin Time: 31.1 seconds — ABNORMAL HIGH (ref 11.4–15.2)

## 2020-12-07 LAB — LACTIC ACID, PLASMA
Lactic Acid, Venous: 2.2 mmol/L (ref 0.5–1.9)
Lactic Acid, Venous: 4 mmol/L (ref 0.5–1.9)

## 2020-12-07 MED ORDER — AZITHROMYCIN 250 MG PO TABS
500.0000 mg | ORAL_TABLET | Freq: Every day | ORAL | Status: DC
  Administered 2020-12-07 – 2020-12-12 (×6): 500 mg via ORAL
  Filled 2020-12-07 (×6): qty 2

## 2020-12-07 MED ORDER — RISAQUAD PO CAPS
1.0000 | ORAL_CAPSULE | Freq: Three times a day (TID) | ORAL | Status: DC
  Administered 2020-12-07 – 2020-12-12 (×16): 1 via ORAL
  Filled 2020-12-07 (×16): qty 1

## 2020-12-07 MED ORDER — FUROSEMIDE 10 MG/ML IJ SOLN
60.0000 mg | Freq: Two times a day (BID) | INTRAMUSCULAR | Status: DC
  Administered 2020-12-08 – 2020-12-09 (×4): 60 mg via INTRAVENOUS
  Filled 2020-12-07 (×5): qty 6

## 2020-12-07 MED ORDER — GABAPENTIN 300 MG PO CAPS
600.0000 mg | ORAL_CAPSULE | Freq: Every day | ORAL | Status: DC
  Administered 2020-12-07 – 2020-12-11 (×5): 600 mg via ORAL
  Filled 2020-12-07 (×5): qty 2

## 2020-12-07 NOTE — Progress Notes (Signed)
Decreased ATC to 28% and placed 2lpm [place] on as well. Pt wears 4lpm [place] at home.

## 2020-12-07 NOTE — Progress Notes (Signed)
ANTICOAGULATION CONSULT NOTE -  Pharmacy Consult for Warfarin Indication: VTE Treatment - Continuing PTA  Allergies  Allergen Reactions  . Penicillins Rash    Has patient had a PCN reaction causing immediate rash, facial/tongue/throat swelling, SOB or lightheadedness with hypotension: Yes Has patient had a PCN reaction causing severe rash involving mucus membranes or skin necrosis: No Has patient had a PCN reaction that required hospitalization No Has patient had a PCN reaction occurring within the last 10 years: No If all of the above answers are "NO", then may proceed with Cephalosporin use.   REACTION: rash Pt has tolerated cefepime in the past.  . Ciprofloxacin     nausea  . Ibuprofen Rash    swelling in leg     Patient Measurements: Height: 4\' 10"  (147.3 cm) Weight: 96.8 kg (213 lb 6.5 oz) IBW/kg (Calculated) : 40.9  Vital Signs: Temp: 98 F (36.7 C) (04/03 0826) Temp Source: Oral (04/03 0826) BP: 104/22 (04/03 0900) Pulse Rate: 59 (04/03 0900)  Labs: Recent Labs    12/05/20 1714 12/05/20 1856 12/06/20 0451 12/07/20 0514  HGB 13.4  --  12.8 12.7  HCT 43.8  --  41.5 41.1  PLT 220  --  203 224  LABPROT 28.9*  --  27.2* 31.1*  INR 2.8*  --  2.6* 3.1*  CREATININE 1.56*  --  1.48* 1.44*  TROPONINIHS 15 14  --   --     Estimated Creatinine Clearance: 44.1 mL/min (A) (by C-G formula based on SCr of 1.44 mg/dL (H)).   Medical History: Past Medical History:  Diagnosis Date  . Acute kidney failure with lesion of tubular necrosis (HCC)   . Acute systolic heart failure ([place])   . CAD (coronary artery disease)    a. s/p prior PCI. b. CABG 2007 at [facility] in [place] 2007. c. inferior STEMI 10/2015 s/p DES to dSVG-PDA.  . Cardiac arrest ([place])   . Cervical cancer ([place])   . Chronic diastolic CHF (congestive heart failure) ([place])   . Chronic respiratory failure ([place])    s/p tracheostomy 2002  . Chronic RUQ pain   . COPD (chronic obstructive pulmonary  disease) ([place])   . Diabetes mellitus ([place])   . DVT (deep venous thrombosis) ([place])   . Endometriosis   . History of gallstones 01/2016   seen on Ultrasound  . History of HIDA scan 11/2016   normal  . HTN (hypertension)   . Hyperlipidemia   . Lupus anticoagulant disorder (HCC)    on coumadin  . Morbid obesity ([place])   . Psoriasis   . ST elevation (STEMI) myocardial infarction involving right coronary artery ([place]) 10/29/15   stent to VG to PDA  . Toe fracture, right 03/29/2018  . Tracheostomy in place [facility]), chronic since 2002 11/03/2015     Assessment: 55 yo female presented on 12/05/2020 with SOB, diarrhea and leg swelling. Patient is on warfarin prior to admission for lupus anticoagulant with history of VTE and Afib.    Prior to admission regimen (per RN who discussed w/ pt): warfarin 2.5mg  daily except 5mg  on Mondays/Fridays. Managed per PCP.   INR 2.8 is therapeutic. Patient reported last dose 3/30. No dose on 3/31 per pt due to "Dr telling her not to take it since she was too thin". Suspect this means patient had a high INR but unable to confirm since no notes available in [facility] or Care Everywhere. Patient also started on azithromycin which can increase INR.   4/3  INR 3.1   Goal of Therapy:  INR 2-3 Monitor platelets by anticoagulation protocol: Yes   Plan:  No Warfarin today Monitor INR, CBC and s/s of bleeding daily  Isac Sarna, BS [place], [place] Clinical Pharmacist Pager [phone] 12/07/2020,9:43 AM

## 2020-12-07 NOTE — Progress Notes (Signed)
Date and time results received: 12/07/20 1120   Test: Lactic Acid  Critical Value: 4.0  Name of Provider Notified: Dr. Roger Shelter   Orders Received? Or Actions Taken?: Orders Received - See Orders for details

## 2020-12-07 NOTE — Progress Notes (Signed)
Notified Dr. Roger Shelter lactic acid 2,2

## 2020-12-07 NOTE — Progress Notes (Signed)
PROGRESS NOTE    Patient: Theresa [name]                            PCP: Monico Blitz, MD                    DOB: 15-Apr-1966            DOA: 12/05/2020 QIW:[id number]             DOS: 12/07/2020, 10:23 AM   LOS: 2 days   Date of Service: The patient was seen and examined on 12/07/2020  Subjective:   The patient was seen and examined this morning, sitting up in bed, still on 10 L of oxygen via tracheostomy,.. Stating improved shortness of breath denies any chest pain   Brief Narrative:  Theresa [name]  is a 55 y.o. female, with systolic and diastolic CHF, COPD, continued tobacco abuse, diabetes mellitus type 2, lupus anticoagulant syndrome, coronary artery disease with history of STEMI, hypertension, chronic respiratory failure on 4 L, chronic tracheostomy (since 2002). -Patient presents today secondary to complaints of shortness of breath, over the last week, she reports worsening dyspnea, leg swelling, per EMS she was 70% on her baseline 4 L nasal cannula, in ED she was 82% on 4 L, she is currently requiring ATC at 28% (10 L), denies fever, chills, chest pain, abdominal pain, no dysuria or polyuria, reports she has been taking torsemide as instructed, reports she has been compliant with her low-salt diet as well, . -In ED her chest x-ray significant cardiomegaly with vascular congestion, her BNP elevated at 195, glucose elevated at 314, she was wheezing, dyspnea has improved with IV Lasix and IV Solu-Medrol, [facility] consulted to admit.   Assessment & Plan:   Active Problems:   DM (diabetes mellitus) (HCC)   COPD exacerbation (HCC)   Acute on chronic congestive heart failure (HCC)   Acute on chronic combined systolic and diastolic HF (heart failure) (HCC)   CAD (coronary artery disease)   Cigarette nicotine dependence without complication   DVT (deep venous thrombosis) (HCC)   Atrial flutter with rapid ventricular response (HCC)   Acute on chronic respiratory failure  (HCC)   Acute on chronic hypoxic respiratory failure-with history of COPD/CHF -Still complaining shortness of breath -She is on 4 L nasal cannula at baseline,  -Multifactorial likely COPD and CHF exacerbation Remains on 10 L of oxygen via tracheostomy, 95% this morning, FiO2 of 40% ABG 7.445/PCO2 52.2/PO2 68.6-We will continue to treat underlying COPD and CHF, -we will Continuing IV steroids, IV Lasix, monitoring I's and O's (-500 ),    -RT assisting with tapering down patient to baseline -Continue pulmonary toiletry -Bronchodilator treatments -wean Oxygen as tolerated.   COPD exacerbation -Treatment as above - pulmonary toiletry, DuoNeb bronchodilator treatment as needed and scheduled -IV Solu-Medrol--taper down -Crease trach secretion, mucolytic's, -Empiric antibiotics with doxycycline initiated will be continued    Acute on chronic systolic/diastolic CHF -Remained in respiratory distress-still on 10 L of oxygen by trach --04/20/2020 echo EF 41-74%, grade 3 diastolic dysfunction  -04/20/47 echo EF 50%, global HK, indeterminant diastolic -holding bumex today and reeval to restar  -Reports she has been compliant with torsemide, blood pressure remained soft, -Been started on IV Lasix 40 mg every 8 hours will be continued for today -Monitoring I's and O's ( -1400),  -- Daily weight 100.2-100.4 kg >> 96.8 kg -Echocardiogram for 10/26/2020; reviewed E JF 45-50%,  global hypokinesis, LVH, (low voltage EKG-repeating echo to rule out other pathologies aside from CHF)>>> -Following labs, proBNP daily -BNP 695   Lupus anticoagulant syndrome/ -Currently on IV Solu-Medrol,  -home medication of prednisone will be continued with taper. -Currently stable on 5 L -Creatinine 1.48, monitoring   Atrial flutter -Remain on telemetry -Currently in sinus rhythm -Continue amiodarone, digoxin, Toprol-XL, Coumadin   Lupus anticoagulant syndrome -coumadin with pharmacy  assistance  Coronary artery disease -No chest pain presently -Continue current meds   Uncontrolled diabetes mellitus type II  with hyperglycemia -stable ,  -holding lantus temporarily -NovoLog sliding scale -10/11-hemoglobin A1c--8.9 -holding Ozempic -Increasing Lantus from 30 daily to 25u BID-  anticipating hyperglycemia due to steroids  Depression -stable  -Continue sertraline and home dose lorazepam   Lupus anticoagulant syndrome,history of DVT on chronic anticoagulation -coumadin with pharmacy assistance -Monitoring INR daily  GERD -Continue with PPI  Hypothyroidism -stable. Continue with levothyroxine   ---------------------------------------------------------------------------------------------------------------------------------------- Nutritional status:  The patient's BMI is: Body mass index is 44.6 kg/m. I agree with the assessment and plan as outlined  Skin Assessment: I have examined the patient's skin and I agree with the wound assessment as performed by wound care team As outlined     ---------------------------------------------------------------------------------------------------------------------------------------- Cultures; Sputum culture >>>  Influenza A/B negative SARS-CoV-2 negative  Antimicrobials: P.o. doxycycline   Consultants: NONE    ----------------------------------------------------------------------------------------------------------------------------------------  DVT prophylaxis:  SCD/Compression stockings and HF:WYOVZCHY Code Status:   Code Status: Full Code  Family Communication: Discussed with patient No family present at bedside The above findings and plan of care has been discussed with patient   in detail,  they expressed understanding and agreement of above. -Advance care planning has been discussed.   Admission status:   Status is: Inpatient  Remains inpatient appropriate because:Inpatient level of  care appropriate due to severity of illness   Dispo: The patient is from: Home              Anticipated d/c is to: Home w HH in > 3 days               Patient currently is not medically stable to d/c.   Difficult to place patient No      Level of care: Stepdown   Procedures:   No admission procedures for hospital encounter.     Antimicrobials:  Anti-infectives (From admission, onward)   Start     Dose/Rate Route Frequency Ordered Stop   12/05/20 1930  doxycycline (VIBRA-TABS) tablet 100 mg        100 mg Oral Every 12 hours 12/05/20 1920     12/05/20 1900  azithromycin (ZITHROMAX) 500 mg in sodium chloride 0.9 % 250 mL IVPB  Status:  Discontinued        500 mg 250 mL/hr over 60 Minutes Intravenous Every 24 hours 12/05/20 1848 12/05/20 1920       Medication:  . acidophilus  1 capsule Oral TID  . atorvastatin  20 mg Oral Daily  . Chlorhexidine Gluconate Cloth  6 each Topical Daily  . digoxin  125 mcg Oral Daily  . doxycycline  100 mg Oral Q12H  . furosemide  60 mg Intravenous Q8H  . gabapentin  900 mg Oral QHS  . guaiFENesin-dextromethorphan  10 mL Oral Q8H  . insulin aspart  0-15 Units Subcutaneous TID WC  . insulin aspart  0-5 Units Subcutaneous QHS  . insulin glargine  25 Units Subcutaneous BID  . ipratropium-albuterol  3 mL Nebulization Q6H  .  levothyroxine  50 mcg Oral Q0600  . metolazone  5 mg Oral Q72H  . metoprolol succinate  25 mg Oral Daily  . mometasone  1 application Topical Daily  . pantoprazole  40 mg Oral Daily  . potassium chloride SA  40 mEq Oral Daily  . predniSONE  40 mg Oral Q breakfast  . sertraline  50 mg Oral QHS  . tamsulosin  0.4 mg Oral Daily  . Warfarin - Pharmacist Dosing Inpatient   Does not apply q1600    acetaminophen **OR** acetaminophen, albuterol, LORazepam, nitroGLYCERIN, polyethylene glycol, traMADol   Objective:   Vitals:   12/07/20 0800 12/07/20 0826 12/07/20 0900 12/07/20 0940  BP: 119/69  (!) 104/22 (!) 119/53   Pulse: 62  (!) 59 (!) 51  Resp: 19  12 17   Temp:  98 F (36.7 C)    TempSrc:  Oral    SpO2: 92%  97% (!) 87%  Weight:      Height:        Intake/Output Summary (Last 24 hours) at 12/07/2020 1023 Last data filed at 12/06/2020 1100 Gross per 24 hour  Intake --  Output 500 ml  Net -500 ml   Filed Weights   12/05/20 1707 12/06/20 0045 12/07/20 0425  Weight: 100.2 kg 100.4 kg 96.8 kg     Examination:     Physical Exam:   General:  Alert, oriented, cooperative, still in shortness of breath, 10 L of oxygen via trach  HEENT:   Tracheostomy site patent, clear with minimal secretion, normocephalic, PERRL, otherwise with in Normal limits   Neuro:  CNII-XII intact. , normal motor and sensation, reflexes intact   Lungs:    Diffuse rhonchi, mild wheezing, minimal crackles at lower lobe, positive breath sounds diffusely  Cardio:    S1/S2, RRR, No murmure, No Rubs or Gallops   Abdomen:   Soft, non-tender, bowel sounds active all four quadrants,  no guarding or peritoneal signs.  Muscular skeletal:  Limited exam - in bed, able to move all 4 extremities, Normal strength,  2+ pulses,  symmetric, No pitting edema  Skin:  Dry, warm to touch, negative for any Rashes,  Wounds: Please see nursing documentation            ------------------------------------------------------------------------------------------------------------------------------------------    LABs:  CBC Latest Ref Rng & Units 12/07/2020 12/06/2020 12/05/2020  WBC 4.0 - 10.5 K/uL 12.0(H) 6.0 8.0  Hemoglobin 12.0 - 15.0 g/dL 12.7 12.8 13.4  Hematocrit 36.0 - 46.0 % 41.1 41.5 43.8  Platelets 150 - 400 K/uL 224 203 220   CMP Latest Ref Rng & Units 12/07/2020 12/06/2020 12/05/2020  Glucose 70 - 99 mg/dL 227(H) 348(H) 314(H)  BUN 6 - 20 mg/dL 36(H) 36(H) 34(H)  Creatinine 0.44 - 1.00 mg/dL 1.44(H) 1.48(H) 1.56(H)  Sodium 135 - 145 mmol/L 137 135 132(L)  Potassium 3.5 - 5.1 mmol/L 3.3(L) 4.3 4.3  Chloride 98 - 111 mmol/L 87(L)  88(L) 91(L)  CO2 22 - 32 mmol/L 37(H) 33(H) 30  Calcium 8.9 - 10.3 mg/dL 8.2(L) 8.3(L) 8.3(L)  Total Protein 6.5 - 8.1 g/dL 6.4(L) 6.4(L) 6.9  Total Bilirubin 0.3 - 1.2 mg/dL 0.8 0.9 0.8  Alkaline Phos 38 - 126 U/L 102 122 133(H)  AST 15 - 41 U/L 11(L) 11(L) 14(L)  ALT 0 - 44 U/L 19 21 23        Micro Results Recent Results (from the past 240 hour(s))  Resp Panel by RT-PCR (Flu A&B, Covid) Nasopharyngeal Swab  Status: None   Collection Time: 12/05/20  5:58 PM   Specimen: Nasopharyngeal Swab; Nasopharyngeal(NP) swabs in vial transport medium  Result Value Ref Range Status   SARS Coronavirus 2 by RT PCR NEGATIVE NEGATIVE Final    Comment: (NOTE) SARS-CoV-2 target nucleic acids are NOT DETECTED.  The SARS-CoV-2 RNA is generally detectable in upper respiratory specimens during the acute phase of infection. The lowest concentration of SARS-CoV-2 viral copies this assay can detect is 138 copies/mL. A negative result does not preclude SARS-Cov-2 infection and should not be used as the sole basis for treatment or other patient management decisions. A negative result may occur with  improper specimen collection/handling, submission of specimen other than nasopharyngeal swab, presence of viral mutation(s) within the areas targeted by this assay, and inadequate number of viral copies(<138 copies/mL). A negative result must be combined with clinical observations, patient history, and epidemiological information. The expected result is Negative.  Fact Sheet for Patients:  EntrepreneurPulse.com.au  Fact Sheet for Healthcare Providers:  IncredibleEmployment.be  This test is no t yet approved or cleared by the [place] FDA and  has been authorized for detection and/or diagnosis of SARS-CoV-2 by FDA under an Emergency Use Authorization (EUA). This EUA will remain  in effect (meaning this test can be used) for the duration of the COVID-19  declaration under Section 564(b)(1) of the Act, 21 U.S.C.section 360bbb-3(b)(1), unless the authorization is terminated  or revoked sooner.       Influenza A by PCR NEGATIVE NEGATIVE Final   Influenza B by PCR NEGATIVE NEGATIVE Final    Comment: (NOTE) The Xpert Xpress SARS-CoV-2/FLU/RSV plus assay is intended as an aid in the diagnosis of influenza from Nasopharyngeal swab specimens and should not be used as a sole basis for treatment. Nasal washings and aspirates are unacceptable for Xpert Xpress SARS-CoV-2/FLU/RSV testing.  Fact Sheet for Patients: EntrepreneurPulse.com.au  Fact Sheet for Healthcare Providers: IncredibleEmployment.be  This test is not yet approved or cleared by the [place] FDA and has been authorized for detection and/or diagnosis of SARS-CoV-2 by FDA under an Emergency Use Authorization (EUA). This EUA will remain in effect (meaning this test can be used) for the duration of the COVID-19 declaration under Section 564(b)(1) of the Act, 21 U.S.C. section 360bbb-3(b)(1), unless the authorization is terminated or revoked.  Performed at [facility], [address] [place], [place] [address]   MRSA PCR Screening     Status: None   Collection Time: 12/06/20 12:40 AM   Specimen: Nasal Mucosa; Nasopharyngeal  Result Value Ref Range Status   MRSA by PCR NEGATIVE NEGATIVE Final    Comment:        The GeneXpert MRSA Assay (FDA approved for NASAL specimens only), is one component of a comprehensive MRSA colonization surveillance program. It is not intended to diagnose MRSA infection nor to guide or monitor treatment for MRSA infections. Performed at [facility], [address], [place], [place] [address]     Radiology Reports DG Chest 1 View  Result Date: 12/05/2020 CLINICAL DATA:  55 year old female with shortness of breath. EXAM: CHEST  1 VIEW COMPARISON:  Chest radiograph dated 10/09/2020. FINDINGS: Tracheostomy  with tip above the carina. There is cardiomegaly with vascular congestion. Pneumonia is not excluded. Overall improved congestion compared to the prior radiograph. No focal consolidation or pneumothorax. No large pleural effusion. Median sternotomy wires. No acute osseous pathology. IMPRESSION: Cardiomegaly with vascular congestion. Electronically Signed   By: Anner Crete M.D.   On: 12/05/2020  17:32   DG CHEST PORT 1 VIEW  Result Date: 12/07/2020 CLINICAL DATA:  Tracheostomy, dyspnea, CHF EXAM: PORTABLE CHEST 1 VIEW COMPARISON:  Chest radiograph from one day prior. FINDINGS: Tracheostomy tube tip overlies the tracheal air column at the thoracic inlet. Intact sternotomy wires. Stable cardiomediastinal silhouette with moderate cardiomegaly. No pneumothorax. Probable trace bilateral pleural effusions. Suture line at the right costophrenic angle again noted. Mild pulmonary edema. No acute consolidative airspace disease. IMPRESSION: 1. Mild congestive heart failure. 2. Probable trace bilateral pleural effusions. Electronically Signed   By: Theresa [name] M.D.   On: 12/07/2020 10:15   DG CHEST PORT 1 VIEW  Result Date: 12/06/2020 CLINICAL DATA:  Shortness of breath. EXAM: PORTABLE CHEST 1 VIEW COMPARISON:  12/05/2020 FINDINGS: Tracheostomy tube appears in adequate position. Sternotomy wires unchanged. Lordotic technique is demonstrated as lungs are adequately inflated without lobar consolidation or effusion. Surgical suture line over the right base unchanged. Mild stable prominence of the pulmonary vasculature which may indicate a mild degree of vascular congestion. Stable cardiomegaly. Remainder of the exam is unchanged. IMPRESSION: Stable cardiomegaly with suggestion of mild vascular congestion. Electronically Signed   By: Marin Olp M.D.   On: 12/06/2020 08:19   ECHOCARDIOGRAM LIMITED  Result Date: 12/06/2020    ECHOCARDIOGRAM LIMITED REPORT   Patient Name:   Theresa KEEVEN Date of Exam: 12/06/2020  Medical Rec #:  [id number]       Height:       58.0 in Accession #:    [id number]      Weight:       221.3 lb Date of Birth:  1966/02/11       BSA:          1.901 m Patient Age:    60 years        BP:           125/47 mmHg Patient Gender: F               HR:           70 bpm. Exam Location:  [facility] Procedure: Limited Echo Indications:    Abnormal ECG R94.31  History:        Patient has prior history of Echocardiogram examinations, most                 recent 07/02/2020. CAD, COPD, Signs/Symptoms:Dyspnea; Risk                 Factors:Diabetes, Hypertension, Dyslipidemia and Current Smoker.  Sonographer:    Leavy Cella RDCS (AE) Referring Phys: [id number] [address] EL[place] IMPRESSIONS  1. Left ventricular ejection fraction, by estimation, is 45 to 50%. The left ventricle has mildly decreased function. The left ventricle demonstrates global hypokinesis. There is mild left ventricular hypertrophy. Left ventricular diastolic parameters were normal.  2. Right ventricular systolic function is normal. The right ventricular size is normal. There is moderately elevated pulmonary artery systolic pressure. The estimated right ventricular systolic pressure is 62.8 mmHg.  3. The mitral valve is normal in structure. No evidence of mitral valve regurgitation. No evidence of mitral stenosis.  4. The aortic valve is normal in structure. Aortic valve regurgitation is not visualized. No aortic stenosis is present.  5. The inferior vena cava is normal in size with greater than 50% respiratory variability, suggesting right atrial pressure of 3 mmHg. Comparison(s): No significant change from prior study. Prior images reviewed side by side. FINDINGS  Left Ventricle: Left ventricular ejection fraction, by  estimation, is 45 to 50%. The left ventricle has mildly decreased function. The left ventricle demonstrates global hypokinesis. Definity contrast agent was given IV to delineate the left ventricular  endocardial borders. The left  ventricular internal cavity size was normal in size. There is mild left ventricular hypertrophy. Left ventricular diastolic parameters were normal.  LV Wall Scoring: The mid inferoseptal segment and apical septal segment are akinetic. Right Ventricle: The right ventricular size is normal. No increase in right ventricular wall thickness. Right ventricular systolic function is normal. There is moderately elevated pulmonary artery systolic pressure. The tricuspid regurgitant velocity is 3.50 m/s, and with an assumed right atrial pressure of 3 mmHg, the estimated right ventricular systolic pressure is 28.3 mmHg. Left Atrium: Left atrial size was normal in size. Right Atrium: Right atrial size was normal in size. Pericardium: There is no evidence of pericardial effusion. Mitral Valve: The mitral valve is normal in structure. No evidence of mitral valve stenosis. Tricuspid Valve: The tricuspid valve is normal in structure. Tricuspid valve regurgitation is mild . No evidence of tricuspid stenosis. Aortic Valve: The aortic valve is normal in structure. Aortic valve regurgitation is not visualized. No aortic stenosis is present. Pulmonic Valve: The pulmonic valve was normal in structure. Pulmonic valve regurgitation is mild. No evidence of pulmonic stenosis. Aorta: The aortic root is normal in size and structure. Venous: The inferior vena cava is normal in size with greater than 50% respiratory variability, suggesting right atrial pressure of 3 mmHg. IAS/Shunts: No atrial level shunt detected by color flow Doppler. LEFT VENTRICLE PLAX 2D LVIDd:         4.19 cm  Diastology LVIDs:         3.10 cm  LV e' medial:    3.98 cm/s LV PW:         1.52 cm  LV E/e' medial:  16.2 LV IVS:        1.29 cm  LV e' lateral:   7.20 cm/s LVOT diam:     1.80 cm  LV E/e' lateral: 8.9 LVOT Area:     2.54 cm  LEFT ATRIUM         Index LA diam:    4.30 cm 2.26 cm/m   AORTA Ao Root diam: 3.00 cm MITRAL VALVE               TRICUSPID VALVE MV Area  (PHT): 2.91 cm    TR Peak grad:   49.0 mmHg MV Decel Time: 261 msec    TR Vmax:        350.00 cm/s MV E velocity: 64.30 cm/s MV A velocity: 47.10 cm/s  SHUNTS MV E/A ratio:  1.37        Systemic Diam: 1.80 cm Candee Furbish MD Electronically signed by Candee Furbish MD Signature Date/Time: 12/06/2020/1:27:33 PM    Final     SIGNED: Deatra James, MD, FHM. [facility],  Pager (please use [email or web address] to page/text) Please use Epic Secure Chat for non-urgent communication (7AM-7PM)  If 7PM-7AM, please contact night-coverage [email or web address], 12/07/2020, 10:23 AM

## 2020-12-08 DIAGNOSIS — J9621 Acute and chronic respiratory failure with hypoxia: Secondary | ICD-10-CM | POA: Diagnosis not present

## 2020-12-08 DIAGNOSIS — I2510 Atherosclerotic heart disease of native coronary artery without angina pectoris: Secondary | ICD-10-CM | POA: Diagnosis not present

## 2020-12-08 DIAGNOSIS — I4892 Unspecified atrial flutter: Secondary | ICD-10-CM | POA: Diagnosis not present

## 2020-12-08 DIAGNOSIS — I5043 Acute on chronic combined systolic (congestive) and diastolic (congestive) heart failure: Secondary | ICD-10-CM | POA: Diagnosis not present

## 2020-12-08 LAB — GLUCOSE, CAPILLARY
Glucose-Capillary: 56 mg/dL — ABNORMAL LOW (ref 70–99)
Glucose-Capillary: 149 mg/dL — ABNORMAL HIGH (ref 70–99)
Glucose-Capillary: 184 mg/dL — ABNORMAL HIGH (ref 70–99)
Glucose-Capillary: 222 mg/dL — ABNORMAL HIGH (ref 70–99)

## 2020-12-08 LAB — COMPREHENSIVE METABOLIC PANEL
ALT: 20 U/L (ref 0–44)
AST: 13 U/L — ABNORMAL LOW (ref 15–41)
Albumin: 2.7 g/dL — ABNORMAL LOW (ref 3.5–5.0)
Alkaline Phosphatase: 95 U/L (ref 38–126)
Anion gap: 13 (ref 5–15)
BUN: 41 mg/dL — ABNORMAL HIGH (ref 6–20)
CO2: 39 mmol/L — ABNORMAL HIGH (ref 22–32)
Calcium: 8.4 mg/dL — ABNORMAL LOW (ref 8.9–10.3)
Chloride: 87 mmol/L — ABNORMAL LOW (ref 98–111)
Creatinine, Ser: 1.49 mg/dL — ABNORMAL HIGH (ref 0.44–1.00)
GFR, Estimated: 41 mL/min — ABNORMAL LOW (ref >60)
Glucose, Bld: 52 mg/dL — ABNORMAL LOW (ref 70–99)
Potassium: 3.1 mmol/L — ABNORMAL LOW (ref 3.5–5.1)
Sodium: 139 mmol/L (ref 135–145)
Total Bilirubin: 0.7 mg/dL (ref 0.3–1.2)
Total Protein: 6.3 g/dL — ABNORMAL LOW (ref 6.5–8.1)

## 2020-12-08 LAB — CBC
HCT: 42 % (ref 36.0–46.0)
Hemoglobin: 12.8 g/dL (ref 12.0–15.0)
MCH: 29.2 pg (ref 26.0–34.0)
MCHC: 30.5 g/dL (ref 30.0–36.0)
MCV: 95.7 fL (ref 80.0–100.0)
Platelets: 269 10*3/uL (ref 150–400)
RBC: 4.39 MIL/uL (ref 3.87–5.11)
RDW: 16.7 % — ABNORMAL HIGH (ref 11.5–15.5)
WBC: 14 10*3/uL — ABNORMAL HIGH (ref 4.0–10.5)
nRBC: 0 % (ref 0.0–0.2)

## 2020-12-08 LAB — PROTIME-INR
INR: 3.2 — ABNORMAL HIGH (ref 0.8–1.2)
Prothrombin Time: 31.5 seconds — ABNORMAL HIGH (ref 11.4–15.2)

## 2020-12-08 MED ORDER — INSULIN GLARGINE 100 UNIT/ML ~~LOC~~ SOLN
20.0000 [IU] | Freq: Two times a day (BID) | SUBCUTANEOUS | Status: DC
  Administered 2020-12-08 (×2): 20 [IU] via SUBCUTANEOUS
  Filled 2020-12-08 (×5): qty 0.2

## 2020-12-08 MED ORDER — POTASSIUM CHLORIDE CRYS ER 20 MEQ PO TBCR
40.0000 meq | EXTENDED_RELEASE_TABLET | Freq: Once | ORAL | Status: AC
  Administered 2020-12-08: 40 meq via ORAL
  Filled 2020-12-08: qty 2

## 2020-12-08 MED ORDER — INSULIN ASPART 100 UNIT/ML ~~LOC~~ SOLN
0.0000 [IU] | Freq: Three times a day (TID) | SUBCUTANEOUS | Status: DC
  Administered 2020-12-08: 2 [IU] via SUBCUTANEOUS
  Administered 2020-12-08 – 2020-12-09 (×2): 1 [IU] via SUBCUTANEOUS
  Administered 2020-12-09 – 2020-12-10 (×2): 5 [IU] via SUBCUTANEOUS
  Administered 2020-12-10: 3 [IU] via SUBCUTANEOUS
  Administered 2020-12-11: 2 [IU] via SUBCUTANEOUS
  Administered 2020-12-11: 5 [IU] via SUBCUTANEOUS
  Administered 2020-12-12: 1 [IU] via SUBCUTANEOUS

## 2020-12-08 MED ORDER — IPRATROPIUM-ALBUTEROL 0.5-2.5 (3) MG/3ML IN SOLN
3.0000 mL | Freq: Three times a day (TID) | RESPIRATORY_TRACT | Status: DC
  Administered 2020-12-08 – 2020-12-12 (×14): 3 mL via RESPIRATORY_TRACT
  Filled 2020-12-08 (×13): qty 3

## 2020-12-08 NOTE — Progress Notes (Signed)
PROGRESS NOTE    Patient: Theresa [name]                            PCP: Monico Blitz, MD                    DOB: 11-Mar-1966            DOA: 12/05/2020 [id number]             DOS: 12/08/2020, 11:57 AM   LOS: 3 days   Date of Service: The patient was seen and examined on 12/08/2020  Subjective:   The patient was seen and examined this morning, awake alert, pleasant cooperative.  On 2 L of oxygen via nasal cannula, 8 L via tracheostomy, satting 94% Otherwise afebrile normotensive CBG this morning 56, then 149   Brief Narrative:  Theresa [name]  is a 55 y.o. female, with systolic and diastolic CHF, COPD, continued tobacco abuse, diabetes mellitus type 2, lupus anticoagulant syndrome, coronary artery disease with history of STEMI, hypertension, chronic respiratory failure on 4 L, chronic tracheostomy (since 2002). -Patient presents today secondary to complaints of shortness of breath, over the last week, she reports worsening dyspnea, leg swelling, per EMS she was 70% on her baseline 4 L nasal cannula, in ED she was 82% on 4 L, she is currently requiring ATC at 28% (10 L), denies fever, chills, chest pain, abdominal pain, no dysuria or polyuria, reports she has been taking torsemide as instructed, reports she has been compliant with her low-salt diet as well, . -In ED her chest x-ray significant cardiomegaly with vascular congestion, her BNP elevated at 195, glucose elevated at 314, she was wheezing, dyspnea has improved with IV Lasix and IV Solu-Medrol, [facility] consulted to admit.   Assessment & Plan:   Active Problems:   DM (diabetes mellitus) (HCC)   COPD exacerbation (HCC)   Acute on chronic congestive heart failure (HCC)   Acute on chronic combined systolic and diastolic HF (heart failure) (HCC)   CAD (coronary artery disease)   Cigarette nicotine dependence without complication   DVT (deep venous thrombosis) (HCC)   Atrial flutter with rapid ventricular response (HCC)    Acute on chronic respiratory failure (HCC)   Acute on chronic hypoxic respiratory failure-with history of COPD/CHF -Still having shortness of breath, high flow oxygen -She is on 4 L nasal cannula at baseline,  -Multifactorial likely COPD and CHF exacerbation Improved from 10 L with FiO2 of 35% to 8 L of oxygen via tracheostomy, 2 L via nasal cannula, 12/06/2020 on 10 L, FiO2 of 60 last ABG ABG 7.445/PCO2 52.2/PO2 68.6 -We will continue to treat underlying COPD and CHF, -we will Continuing IV steroids, IV Lasix, monitoring I's and O's (- 1300 ),    -RT assisting with tapering down patient to baseline -Continue pulmonary toiletry -Bronchodilator treatments -wean Oxygen as tolerated... To her baseline of 4 L   COPD exacerbation -Treatment as above - pulmonary toiletry, DuoNeb bronchodilator treatment as needed and scheduled -IV Solu-Medrol--taper down -Crease trach secretion, mucolytic's, -Empiric antibiotics with doxycycline initiated will be continued    Acute on chronic systolic/diastolic CHF -Remained in respiratory distress-still on 10 L of oxygen by trach --04/20/2020 echo EF 09-32%, grade 3 diastolic dysfunction  -67/12/45 echo EF 50%, global HK, indeterminant diastolic -8/0/9983 echo-ED JF 45--50%, mild decreased LV function global hypokinesis, LVH, -holding bumex today and reeval to restar  -Reports she has been  compliant with torsemide, blood pressure remained soft, -Been started on IV Lasix 40 mg every 8 hours will be continued for today -Monitoring I's and O's ( -1300),  -- Daily weight 100.2-100.4 kg >> 96.8 kg >> 97.5 kg -Echocardiogram for 10/26/2020; reviewed E JF 45-50%, global hypokinesis, LVH, (low voltage EKG-repeating echo to rule out other pathologies aside from CHF)>>> -Following labs, proBNP daily -BNP 695   Mild lactic acidosis -respiratory lactic acidosis  -No signs of sepsis -We will continue to monitor   Lupus anticoagulant  syndrome/ -Currently on IV Solu-Medrol, >> back to p.o. prednisone will taper down -home medication of prednisone will be continued with taper. -Currently stable on 5 L -Creatinine 1.48, monitoring   Atrial flutter -Remained stable, on a monitor -Currently in sinus rhythm -Continue amiodarone, digoxin, Toprol-XL, Coumadin   Lupus anticoagulant syndrome -coumadin with pharmacy assistance  Coronary artery disease -Denies any chest pain -Continue current meds   Uncontrolled diabetes mellitus type II  with hyperglycemia -Hypoglycemic this morning, -Being Lantus at lower dose -NovoLog sliding scale -10/11-hemoglobin A1c--8.9 -holding Ozempic   Depression -stable  -Continue sertraline and home dose lorazepam   Lupus anticoagulant syndrome,history of DVT on chronic anticoagulation -coumadin with pharmacy assistance -Monitoring INR daily  GERD -Continue with PPI  Hypothyroidism -stable. Continue with levothyroxine   ---------------------------------------------------------------------------------------------------------------------------------------- Nutritional status:  The patient's BMI is: Body mass index is 44.92 kg/m. I agree with the assessment and plan as outlined  Skin Assessment: I have examined the patient's skin and I agree with the wound assessment as performed by wound care team As outlined     ---------------------------------------------------------------------------------------------------------------------------------------- Cultures; Sputum culture >>>  Influenza A/B negative SARS-CoV-2 negative  Antimicrobials: P.o. doxycycline   Consultants: NONE    ----------------------------------------------------------------------------------------------------------------------------------------  DVT prophylaxis:  SCD/Compression stockings and PR:FFMBWGYK Code Status:   Code Status: Full Code  Family Communication: Discussed with  patient No family present at bedside The above findings and plan of care has been discussed with patient   in detail,  they expressed understanding and agreement of above. -Advance care planning has been discussed.   Admission status:   Status is: Inpatient  Remains inpatient appropriate because:Inpatient level of care appropriate due to severity of illness   Dispo: The patient is from: Home              Anticipated d/c is to: Home w HH in > 3 days               Patient currently is not medically stable to d/c.   Difficult to place patient No      Level of care: Stepdown   Procedures:   No admission procedures for hospital encounter.     Antimicrobials:  Anti-infectives (From admission, onward)   Start     Dose/Rate Route Frequency Ordered Stop   12/07/20 1230  azithromycin (ZITHROMAX) tablet 500 mg        500 mg Oral Daily 12/07/20 1132     12/05/20 1930  doxycycline (VIBRA-TABS) tablet 100 mg  Status:  Discontinued        100 mg Oral Every 12 hours 12/05/20 1920 12/07/20 1132   12/05/20 1900  azithromycin (ZITHROMAX) 500 mg in sodium chloride 0.9 % 250 mL IVPB  Status:  Discontinued        500 mg 250 mL/hr over 60 Minutes Intravenous Every 24 hours 12/05/20 1848 12/05/20 1920       Medication:  . acidophilus  1 capsule Oral TID  . atorvastatin  20 mg Oral Daily  . azithromycin  500 mg Oral Daily  . Chlorhexidine Gluconate Cloth  6 each Topical Daily  . digoxin  125 mcg Oral Daily  . furosemide  60 mg Intravenous Q12H  . gabapentin  600 mg Oral QHS  . guaiFENesin-dextromethorphan  10 mL Oral Q8H  . insulin aspart  0-9 Units Subcutaneous TID WC  . insulin glargine  20 Units Subcutaneous BID  . ipratropium-albuterol  3 mL Nebulization TID  . levothyroxine  50 mcg Oral Q0600  . metolazone  5 mg Oral Q72H  . metoprolol succinate  25 mg Oral Daily  . mometasone  1 application Topical Daily  . pantoprazole  40 mg Oral Daily  . potassium chloride SA  40 mEq  Oral Daily  . predniSONE  40 mg Oral Q breakfast  . sertraline  50 mg Oral QHS  . tamsulosin  0.4 mg Oral Daily  . Warfarin - Pharmacist Dosing Inpatient   Does not apply q1600    acetaminophen **OR** acetaminophen, albuterol, LORazepam, nitroGLYCERIN, polyethylene glycol, traMADol   Objective:   Vitals:   12/08/20 1000 12/08/20 1030 12/08/20 1100 12/08/20 1127  BP: 116/63 127/67 (!) 113/96   Pulse: (!) 57  (!) 54 (!) 54  Resp: 18  18 18   Temp:    98.2 F (36.8 C)  TempSrc:    Oral  SpO2: (!) 77%  91% 94%  Weight:      Height:        Intake/Output Summary (Last 24 hours) at 12/08/2020 1157 Last data filed at 12/08/2020 0604 Gross per 24 hour  Intake 300 ml  Output 1100 ml  Net -800 ml   Filed Weights   12/06/20 0045 12/07/20 0425 12/08/20 0500  Weight: 100.4 kg 96.8 kg 97.5 kg     Examination:        Physical Exam:   General:  Alert, oriented, cooperative, mild shortness of breath on 2 L of oxygen via nasal cannula, and 8 L via tracheostomy  HEENT:  Normocephalic, PERRL, otherwise with in Normal limits   Neuro:  CNII-XII intact. , normal motor and sensation, reflexes intact   Lungs:    Positive breath sounds BL, Respirations unlabored, minimal wheezes / crackles mid to lower lobes  Cardio:    S1/S2, RRR, No murmure, No Rubs or Gallops   Abdomen:   Soft, non-tender, bowel sounds active all four quadrants,  no guarding or peritoneal signs.  Muscular skeletal:  Limited exam - in bed, able to move all 4 extremities, Normal strength,  2+ pulses,  symmetric, No pitting edema  Skin:  Dry, warm to touch, diffuse facial and body rash dry scales  Wounds: Please see nursing documentation               ------------------------------------------------------------------------------------------------------------------------------------------    LABs:  CBC Latest Ref Rng & Units 12/08/2020 12/07/2020 12/06/2020  WBC 4.0 - 10.5 K/uL 14.0(H) 12.0(H) 6.0  Hemoglobin 12.0 -  15.0 g/dL 12.8 12.7 12.8  Hematocrit 36.0 - 46.0 % 42.0 41.1 41.5  Platelets 150 - 400 K/uL 269 224 203   CMP Latest Ref Rng & Units 12/08/2020 12/07/2020 12/06/2020  Glucose 70 - 99 mg/dL 52(L) 227(H) 348(H)  BUN 6 - 20 mg/dL 41(H) 36(H) 36(H)  Creatinine 0.44 - 1.00 mg/dL 1.49(H) 1.44(H) 1.48(H)  Sodium 135 - 145 mmol/L 139 137 135  Potassium 3.5 - 5.1 mmol/L 3.1(L) 3.3(L) 4.3  Chloride 98 - 111 mmol/L 87(L) 87(L) 88(L)  CO2 22 -  32 mmol/L 39(H) 37(H) 33(H)  Calcium 8.9 - 10.3 mg/dL 8.4(L) 8.2(L) 8.3(L)  Total Protein 6.5 - 8.1 g/dL 6.3(L) 6.4(L) 6.4(L)  Total Bilirubin 0.3 - 1.2 mg/dL 0.7 0.8 0.9  Alkaline Phos 38 - 126 U/L 95 102 122  AST 15 - 41 U/L 13(L) 11(L) 11(L)  ALT 0 - 44 U/L 20 19 21        Micro Results Recent Results (from the past 240 hour(s))  Resp Panel by RT-PCR (Flu A&B, Covid) Nasopharyngeal Swab     Status: None   Collection Time: 12/05/20  5:58 PM   Specimen: Nasopharyngeal Swab; Nasopharyngeal(NP) swabs in vial transport medium  Result Value Ref Range Status   SARS Coronavirus 2 by RT PCR NEGATIVE NEGATIVE Final    Comment: (NOTE) SARS-CoV-2 target nucleic acids are NOT DETECTED.  The SARS-CoV-2 RNA is generally detectable in upper respiratory specimens during the acute phase of infection. The lowest concentration of SARS-CoV-2 viral copies this assay can detect is 138 copies/mL. A negative result does not preclude SARS-Cov-2 infection and should not be used as the sole basis for treatment or other patient management decisions. A negative result may occur with  improper specimen collection/handling, submission of specimen other than nasopharyngeal swab, presence of viral mutation(s) within the areas targeted by this assay, and inadequate number of viral copies(<138 copies/mL). A negative result must be combined with clinical observations, patient history, and epidemiological information. The expected result is Negative.  Fact Sheet for Patients:   EntrepreneurPulse.com.au  Fact Sheet for Healthcare Providers:  IncredibleEmployment.be  This test is no t yet approved or cleared by the [place] FDA and  has been authorized for detection and/or diagnosis of SARS-CoV-2 by FDA under an Emergency Use Authorization (EUA). This EUA will remain  in effect (meaning this test can be used) for the duration of the COVID-19 declaration under Section 564(b)(1) of the Act, 21 U.S.C.section 360bbb-3(b)(1), unless the authorization is terminated  or revoked sooner.       Influenza A by PCR NEGATIVE NEGATIVE Final   Influenza B by PCR NEGATIVE NEGATIVE Final    Comment: (NOTE) The Xpert Xpress SARS-CoV-2/FLU/RSV plus assay is intended as an aid in the diagnosis of influenza from Nasopharyngeal swab specimens and should not be used as a sole basis for treatment. Nasal washings and aspirates are unacceptable for Xpert Xpress SARS-CoV-2/FLU/RSV testing.  Fact Sheet for Patients: EntrepreneurPulse.com.au  Fact Sheet for Healthcare Providers: IncredibleEmployment.be  This test is not yet approved or cleared by the [place] FDA and has been authorized for detection and/or diagnosis of SARS-CoV-2 by FDA under an Emergency Use Authorization (EUA). This EUA will remain in effect (meaning this test can be used) for the duration of the COVID-19 declaration under Section 564(b)(1) of the Act, 21 U.S.C. section 360bbb-3(b)(1), unless the authorization is terminated or revoked.  Performed at [facility], [address], [place], [place] [address]   MRSA PCR Screening     Status: None   Collection Time: 12/06/20 12:40 AM   Specimen: Nasal Mucosa; Nasopharyngeal  Result Value Ref Range Status   MRSA by PCR NEGATIVE NEGATIVE Final    Comment:        The GeneXpert MRSA Assay (FDA approved for NASAL specimens only), is one component of a comprehensive MRSA  colonization surveillance program. It is not intended to diagnose MRSA infection nor to guide or monitor treatment for MRSA infections. Performed at [facility], [address], [place], [place] [address]  Radiology Reports DG Chest 1 View  Result Date: 12/05/2020 CLINICAL DATA:  55 year old female with shortness of breath. EXAM: CHEST  1 VIEW COMPARISON:  Chest radiograph dated 10/09/2020. FINDINGS: Tracheostomy with tip above the carina. There is cardiomegaly with vascular congestion. Pneumonia is not excluded. Overall improved congestion compared to the prior radiograph. No focal consolidation or pneumothorax. No large pleural effusion. Median sternotomy wires. No acute osseous pathology. IMPRESSION: Cardiomegaly with vascular congestion. Electronically Signed   By: Anner Crete M.D.   On: 12/05/2020 17:32   DG CHEST PORT 1 VIEW  Result Date: 12/07/2020 CLINICAL DATA:  Tracheostomy, dyspnea, CHF EXAM: PORTABLE CHEST 1 VIEW COMPARISON:  Chest radiograph from one day prior. FINDINGS: Tracheostomy tube tip overlies the tracheal air column at the thoracic inlet. Intact sternotomy wires. Stable cardiomediastinal silhouette with moderate cardiomegaly. No pneumothorax. Probable trace bilateral pleural effusions. Suture line at the right costophrenic angle again noted. Mild pulmonary edema. No acute consolidative airspace disease. IMPRESSION: 1. Mild congestive heart failure. 2. Probable trace bilateral pleural effusions. Electronically Signed   By: Theresa [name] M.D.   On: 12/07/2020 10:15   DG CHEST PORT 1 VIEW  Result Date: 12/06/2020 CLINICAL DATA:  Shortness of breath. EXAM: PORTABLE CHEST 1 VIEW COMPARISON:  12/05/2020 FINDINGS: Tracheostomy tube appears in adequate position. Sternotomy wires unchanged. Lordotic technique is demonstrated as lungs are adequately inflated without lobar consolidation or effusion. Surgical suture line over the right base unchanged. Mild stable prominence of the  pulmonary vasculature which may indicate a mild degree of vascular congestion. Stable cardiomegaly. Remainder of the exam is unchanged. IMPRESSION: Stable cardiomegaly with suggestion of mild vascular congestion. Electronically Signed   By: Marin Olp M.D.   On: 12/06/2020 08:19   ECHOCARDIOGRAM LIMITED  Result Date: 12/06/2020    ECHOCARDIOGRAM LIMITED REPORT   Patient Name:   YANELIS OSIKA Date of Exam: 12/06/2020 Medical Rec #:  [id number]       Height:       58.0 in Accession #:    [id number]      Weight:       221.3 lb Date of Birth:  08-26-1966       BSA:          1.901 m Patient Age:    55 years        BP:           125/47 mmHg Patient Gender: F               HR:           70 bpm. Exam Location:  [facility] Procedure: Limited Echo Indications:    Abnormal ECG R94.31  History:        Patient has prior history of Echocardiogram examinations, most                 recent 07/02/2020. CAD, COPD, Signs/Symptoms:Dyspnea; Risk                 Factors:Diabetes, Hypertension, Dyslipidemia and Current Smoker.  Sonographer:    Leavy Cella RDCS (AE) Referring Phys: [id number] IMPRESSIONS  1. Left ventricular ejection fraction, by estimation, is 45 to 50%. The left ventricle has mildly decreased function. The left ventricle demonstrates global hypokinesis. There is mild left ventricular hypertrophy. Left ventricular diastolic parameters were normal.  2. Right ventricular systolic function is normal. The right ventricular size is normal. There is moderately elevated pulmonary artery systolic pressure. The estimated right ventricular systolic  pressure is 52.0 mmHg.  3. The mitral valve is normal in structure. No evidence of mitral valve regurgitation. No evidence of mitral stenosis.  4. The aortic valve is normal in structure. Aortic valve regurgitation is not visualized. No aortic stenosis is present.  5. The inferior vena cava is normal in size with greater than 50% respiratory variability, suggesting  right atrial pressure of 3 mmHg. Comparison(s): No significant change from prior study. Prior images reviewed side by side. FINDINGS  Left Ventricle: Left ventricular ejection fraction, by estimation, is 45 to 50%. The left ventricle has mildly decreased function. The left ventricle demonstrates global hypokinesis. Definity contrast agent was given IV to delineate the left ventricular  endocardial borders. The left ventricular internal cavity size was normal in size. There is mild left ventricular hypertrophy. Left ventricular diastolic parameters were normal.  LV Wall Scoring: The mid inferoseptal segment and apical septal segment are akinetic. Right Ventricle: The right ventricular size is normal. No increase in right ventricular wall thickness. Right ventricular systolic function is normal. There is moderately elevated pulmonary artery systolic pressure. The tricuspid regurgitant velocity is 3.50 m/s, and with an assumed right atrial pressure of 3 mmHg, the estimated right ventricular systolic pressure is 16.1 mmHg. Left Atrium: Left atrial size was normal in size. Right Atrium: Right atrial size was normal in size. Pericardium: There is no evidence of pericardial effusion. Mitral Valve: The mitral valve is normal in structure. No evidence of mitral valve stenosis. Tricuspid Valve: The tricuspid valve is normal in structure. Tricuspid valve regurgitation is mild . No evidence of tricuspid stenosis. Aortic Valve: The aortic valve is normal in structure. Aortic valve regurgitation is not visualized. No aortic stenosis is present. Pulmonic Valve: The pulmonic valve was normal in structure. Pulmonic valve regurgitation is mild. No evidence of pulmonic stenosis. Aorta: The aortic root is normal in size and structure. Venous: The inferior vena cava is normal in size with greater than 50% respiratory variability, suggesting right atrial pressure of 3 mmHg. IAS/Shunts: No atrial level shunt detected by color flow  Doppler. LEFT VENTRICLE PLAX 2D LVIDd:         4.19 cm  Diastology LVIDs:         3.10 cm  LV e' medial:    3.98 cm/s LV PW:         1.52 cm  LV E/e' medial:  16.2 LV IVS:        1.29 cm  LV e' lateral:   7.20 cm/s LVOT diam:     1.80 cm  LV E/e' lateral: 8.9 LVOT Area:     2.54 cm  LEFT ATRIUM         Index LA diam:    4.30 cm 2.26 cm/m   AORTA Ao Root diam: 3.00 cm MITRAL VALVE               TRICUSPID VALVE MV Area (PHT): 2.91 cm    TR Peak grad:   49.0 mmHg MV Decel Time: 261 msec    TR Vmax:        350.00 cm/s MV E velocity: 64.30 cm/s MV A velocity: 47.10 cm/s  SHUNTS MV E/A ratio:  1.37        Systemic Diam: 1.80 cm Candee Furbish MD Electronically signed by Candee Furbish MD Signature Date/Time: 12/06/2020/1:27:33 PM    Final     SIGNED: Deatra James, MD, FHM. [facility],  Pager (please use [email or web address] to page/text) Please use Epic Secure  Chat for non-urgent communication (7AM-7PM)  If 7PM-7AM, please contact night-coverage [email or web address], 12/08/2020, 11:57 AM

## 2020-12-08 NOTE — Progress Notes (Signed)
Inpatient Diabetes Program Recommendations  AACE/ADA: New Consensus Statement on Inpatient Glycemic Control (2015)  Target Ranges:  Prepandial:   less than 140 mg/dL      Peak postprandial:   less than 180 mg/dL (1-2 hours)      Critically ill patients:  140 - 180 mg/dL   Lab Results  Component Value Date   GLUCAP 56 (L) 12/08/2020   HGBA1C 9.1 (H) 12/05/2020    Review of Glycemic Control Results for Theresa [name], Theresa [name] (MRN [id number]) as of 12/08/2020 08:31  Ref. Range 12/07/2020 07:58 12/07/2020 11:33 12/07/2020 16:53 12/07/2020 21:09 12/08/2020 07:39  Glucose-Capillary Latest Ref Range: 70 - 99 mg/dL 238 (H) 346 (H) 337 (H) 147 (H) 56 (L)   Diabetes history: DM 2 Outpatient Diabetes medications: Tresiba 40 units q HS, Novolog 1-10 units tid with meals Current orders for Inpatient glycemic control:  Prednisone 40 mg daily Novolog moderate tid with meals and HS Lantus 25 units bid Inpatient Diabetes Program Recommendations:   Consider reducing Lantus to 22 units bid.  Also consider reducing Novolog correction to sensitive tid with meals and add Novolog 3 units tid with meals (hold if patient eats less than 50% or NPO).  Thanks,  Adah Perl, RN, BC-ADM Inpatient Diabetes Coordinator Pager [phone] (8a-5p)

## 2020-12-08 NOTE — Progress Notes (Signed)
ANTICOAGULATION CONSULT NOTE -  Pharmacy Consult for Warfarin Indication: VTE Treatment - Continuing PTA  Allergies  Allergen Reactions  . Penicillins Rash    Has patient had a PCN reaction causing immediate rash, facial/tongue/throat swelling, SOB or lightheadedness with hypotension: Yes Has patient had a PCN reaction causing severe rash involving mucus membranes or skin necrosis: No Has patient had a PCN reaction that required hospitalization No Has patient had a PCN reaction occurring within the last 10 years: No If all of the above answers are "NO", then may proceed with Cephalosporin use.   REACTION: rash Pt has tolerated cefepime in the past.  . Ciprofloxacin     nausea  . Ibuprofen Rash    swelling in leg     Patient Measurements: Height: 4\' 10"  (147.3 cm) Weight: 97.5 kg (214 lb 15.2 oz) IBW/kg (Calculated) : 40.9  Vital Signs: Temp: 98.2 F (36.8 C) (04/04 1127) Temp Source: Oral (04/04 1127) BP: 127/67 (04/04 1030) Pulse Rate: 54 (04/04 1127)  Labs: Recent Labs    12/05/20 1714 12/05/20 1856 12/06/20 0451 12/07/20 0514 12/08/20 0313  HGB 13.4  --  12.8 12.7 12.8  HCT 43.8  --  41.5 41.1 42.0  PLT 220  --  203 224 269  LABPROT 28.9*  --  27.2* 31.1* 31.5*  INR 2.8*  --  2.6* 3.1* 3.2*  CREATININE 1.56*  --  1.48* 1.44* 1.49*  TROPONINIHS 15 14  --   --   --     Estimated Creatinine Clearance: 42.8 mL/min (A) (by C-G formula based on SCr of 1.49 mg/dL (H)).   Medical History: Past Medical History:  Diagnosis Date  . Acute kidney failure with lesion of tubular necrosis (HCC)   . Acute systolic heart failure ([place])   . CAD (coronary artery disease)    a. s/p prior PCI. b. CABG 2007 at [facility] in [place] 2007. c. inferior STEMI 10/2015 s/p DES to dSVG-PDA.  . Cardiac arrest ([place])   . Cervical cancer ([place])   . Chronic diastolic CHF (congestive heart failure) ([place])   . Chronic respiratory failure ([place])    s/p tracheostomy 2002  .  Chronic RUQ pain   . COPD (chronic obstructive pulmonary disease) ([place])   . Diabetes mellitus ([place])   . DVT (deep venous thrombosis) ([place])   . Endometriosis   . History of gallstones 01/2016   seen on Ultrasound  . History of HIDA scan 11/2016   normal  . HTN (hypertension)   . Hyperlipidemia   . Lupus anticoagulant disorder (HCC)    on coumadin  . Morbid obesity ([place])   . Psoriasis   . ST elevation (STEMI) myocardial infarction involving right coronary artery ([place]) 10/29/15   stent to VG to PDA  . Toe fracture, right 03/29/2018  . Tracheostomy in place [facility]), chronic since 2002 11/03/2015     Assessment: 55 yo female presented on 12/05/2020 with SOB, diarrhea and leg swelling. Patient is on warfarin prior to admission for lupus anticoagulant with history of VTE and Afib.    Prior to admission regimen (per RN who discussed w/ pt): warfarin 2.5mg  daily except 5mg  on Mondays/Fridays. Managed per PCP.   INR 2.8 is therapeutic. Patient reported last dose 3/30. No dose on 3/31 per pt due to "Dr telling her not to take it since she was too thin". Suspect this means patient had a high INR but unable to confirm since no notes available in [facility] or Care Everywhere. Patient  also started on azithromycin which can increase INR.    INR 3.2 today, continue to hold Coumadin   Goal of Therapy:  INR 2-3 Monitor platelets by anticoagulation protocol: Yes   Plan:  No Warfarin today Monitor INR, CBC and s/s of bleeding daily  Isac Sarna, BS [place], [place] Clinical Pharmacist Pager [phone] 12/08/2020,11:30 AM

## 2020-12-08 NOTE — Progress Notes (Deleted)
Cardiology Office Note  Date: 12/08/2020   ID: Theresa [name], DOB 26-May-1966, MRN [id number]  PCP:  Monico Blitz, MD  Cardiologist:  [name], MD Electrophysiologist:  None   Chief Complaint: Follow-up acute on chronic combined systolic and diastolic heart failure.  History of Present Illness: Theresa [name] is a 55 y.o. female with a history of CAD, chronic diastolic CHF, chronic respiratory failure, COPD, DM 2, HTN, HLD, lupus, morbid obesity, STEMI, cardiac arrest, acute kidney failure lesion of tubular necrosis.  Acute systolic heart failure, DVT, lupus anticoagulant syndrome, tobacco abuse, chronic respiratory failure on 4 L.  Chronic tracheostomy tracheostomy.  Last seen by Dr. Percival Spanish 10/28/2020.  The office visit.  He noted she had a recent admission to [facility] due to volume overload and was diuresed.  During that visit she was taken off amiodarone due to hypothyroid.  Torsemide was changed to 60 mg daily.  He noticed since going home she had not been taking 60 mg twice daily.  Her weight continued to increase.  He stated her dry weight was likely around 207 and was 222 on that visit.  She had been having increasing edema in her legs.  Chronically on 4 L of oxygen at home.  He ordered Zaroxolyn 5 mg for 2 days only.  She was going to double her potassium.  He ordered a basic metabolic panel to be run on Friday.  She was to keep her feet elevated.  She was to be seen back in clinic in 2 weeks.  If she became worse she was to present to the emergency room.  Blood pressure was under control in context of managing her cardiomyopathy.  No ongoing chest discomfort.  No change in therapy.  She was tolerating anticoagulation for history of DVT and lupus anticoagulant syndrome.  She remained off of amiodarone because of hypothyroidism and currently on thyroid replacement therapy.  Recent lab work 11/01/2020 with sodium 133, potassium 3.0, BUN 35, creatinine 1.58, glucose 406. There is a note  in the system on 11/04/2020 for patient to take an additional 40 mEq of potassium on that day and the following day and repeat BMP on Friday which would have been March 4.  She has not had to basic metabolic panel done as of yet.   She is here today for 2-week follow-up.  She has lost approximately 4 pounds.  Blood pressure is doing well at 118/62.  Heart rate is 61.  Denies any significant shortness of breath or DOE.  She has chronic lower extremity edema.  She denies any palpitations or arrhythmias.  No anginal symptoms or use of nitroglycerin.  She is on chronic O2 and has a permanent trach.  Blood sugar has been uncontrolled.  Recent glucose on 11/01/2020 was 406.  She is using a walker to ambulate.  She is here with her husband.  O2 saturation on 4 L O2 is 91%.  She has chronic dyspnea.  She takes metolazone twice per week on Tuesdays and Wednesdays 5 mg.  Past Medical History:  Diagnosis Date  . Acute kidney failure with lesion of tubular necrosis (HCC)   . Acute systolic heart failure ([place])   . CAD (coronary artery disease)    a. s/p prior PCI. b. CABG 2007 at [facility] in [place] 2007. c. inferior STEMI 10/2015 s/p DES to dSVG-PDA.  . Cardiac arrest ([place])   . Cervical cancer ([place])   . Chronic diastolic CHF (congestive heart failure) ([place])   .  Chronic respiratory failure ([place])    s/p tracheostomy 2002  . Chronic RUQ pain   . COPD (chronic obstructive pulmonary disease) ([place])   . Diabetes mellitus ([place])   . DVT (deep venous thrombosis) ([place])   . Endometriosis   . History of gallstones 01/2016   seen on Ultrasound  . History of HIDA scan 11/2016   normal  . HTN (hypertension)   . Hyperlipidemia   . Lupus anticoagulant disorder (HCC)    on coumadin  . Morbid obesity ([place])   . Psoriasis   . ST elevation (STEMI) myocardial infarction involving right coronary artery ([place]) 10/29/15   stent to VG to PDA  . Toe fracture, right 03/29/2018  . Tracheostomy in place [facility]), chronic  since 2002 11/03/2015    Past Surgical History:  Procedure Laterality Date  . CARDIAC CATHETERIZATION N/A 10/29/2015   Procedure: Left Heart Cath and Cors/Grafts Angiography;  Surgeon: Burnell Blanks, MD;  Location: [place] [facility];  Service: Cardiovascular;  Laterality: N/A;  . CARDIAC CATHETERIZATION  10/29/2015   Procedure: Coronary Stent Intervention;  Surgeon: Burnell Blanks, MD;  Location: [place] [facility];  Service: Cardiovascular;;  . CAROTID STENT    . CESAREAN SECTION WITH BILATERAL TUBAL LIGATION    . CORONARY ARTERY BYPASS GRAFT  2007   2V  . IR GASTROSTOMY TUBE MOD SED  11/13/2018  . RIGHT/LEFT HEART CATH AND CORONARY/GRAFT ANGIOGRAPHY N/A 03/24/2018   Procedure: RIGHT/LEFT HEART CATH AND CORONARY/GRAFT ANGIOGRAPHY;  Surgeon: Belva Crome, MD;  Location: [place] CV [facility];  Service: Cardiovascular;  Laterality: N/A;  . TRACHEOSTOMY      No current facility-administered medications for this visit.   No current outpatient medications on file.   Facility-Administered Medications Ordered in Other Visits  Medication Dose Route Frequency Provider Last Rate Last Admin  . acetaminophen (TYLENOL) tablet 650 mg  650 mg Oral Q6H PRN Elgergawy, Silver Huguenin, MD       Or  . acetaminophen (TYLENOL) suppository 650 mg  650 mg Rectal Q6H PRN Elgergawy, Silver Huguenin, MD      . acidophilus (RISAQUAD) capsule 1 capsule  1 capsule Oral TID Skipper Cliche A, MD   1 capsule at 12/07/20 2125  . albuterol (PROVENTIL) (2.5 MG/3ML) 0.083% nebulizer solution 2.5 mg  2.5 mg Nebulization Q4H PRN Elgergawy, Silver Huguenin, MD      . atorvastatin (LIPITOR) tablet 20 mg  20 mg Oral Daily Elgergawy, Silver Huguenin, MD   20 mg at 12/07/20 0931  . azithromycin (ZITHROMAX) tablet 500 mg  500 mg Oral Daily Shahmehdi, Seyed A, MD   500 mg at 12/07/20 1137  . Chlorhexidine Gluconate Cloth 2 % PADS 6 each  6 each Topical Daily Shahmehdi, Seyed A, MD      . digoxin (LANOXIN) tablet 125 mcg  125 mcg Oral Daily  Elgergawy, Silver Huguenin, MD   125 mcg at 12/07/20 0930  . furosemide (LASIX) injection 60 mg  60 mg Intravenous Q12H Shahmehdi, Seyed A, MD   60 mg at 12/08/20 0035  . gabapentin (NEURONTIN) capsule 600 mg  600 mg Oral QHS Shahmehdi, Seyed A, MD   600 mg at 12/07/20 2125  . guaiFENesin-dextromethorphan (ROBITUSSIN DM) 100-10 MG/5ML syrup 10 mL  10 mL Oral Q8H Shahmehdi, Seyed A, MD   10 mL at 12/08/20 0549  . insulin aspart (novoLOG) injection 0-15 Units  0-15 Units Subcutaneous TID WC Elgergawy, Silver Huguenin, MD   15 Units at 12/07/20 1713  .  insulin aspart (novoLOG) injection 0-5 Units  0-5 Units Subcutaneous QHS Elgergawy, Silver Huguenin, MD   4 Units at 12/05/20 2135  . insulin glargine (LANTUS) injection 25 Units  25 Units Subcutaneous BID Deatra James, MD   25 Units at 12/07/20 2125  . ipratropium-albuterol (DUONEB) 0.5-2.5 (3) MG/3ML nebulizer solution 3 mL  3 mL Nebulization TID Skipper Cliche A, MD   3 mL at 12/08/20 0756  . levothyroxine (SYNTHROID) tablet 50 mcg  50 mcg Oral Q0600 Elgergawy, Silver Huguenin, MD   50 mcg at 12/08/20 0549  . LORazepam (ATIVAN) tablet 0.5 mg  0.5 mg Oral Daily PRN Elgergawy, Silver Huguenin, MD      . metolazone (ZAROXOLYN) tablet 5 mg  5 mg Oral Q72H Elgergawy, Silver Huguenin, MD   5 mg at 12/06/20 1015  . metoprolol succinate (TOPROL-XL) 24 hr tablet 25 mg  25 mg Oral Daily Elgergawy, Silver Huguenin, MD   25 mg at 12/07/20 0931  . mometasone (ELOCON) 0.1 % cream 1 application  1 application Topical Daily Elgergawy, Silver Huguenin, MD   1 application at 67/89/38 0932  . nitroGLYCERIN (NITROSTAT) SL tablet 0.4 mg  0.4 mg Sublingual Q5 min PRN Elgergawy, Silver Huguenin, MD      . pantoprazole (PROTONIX) EC tablet 40 mg  40 mg Oral Daily Elgergawy, Silver Huguenin, MD   40 mg at 12/07/20 0931  . polyethylene glycol (MIRALAX / GLYCOLAX) packet 17 g  17 g Oral Daily PRN Elgergawy, Silver Huguenin, MD      . potassium chloride SA (KLOR-CON) CR tablet 40 mEq  40 mEq Oral Daily Elgergawy, Silver Huguenin, MD   40 mEq at 12/07/20  0931  . predniSONE (DELTASONE) tablet 40 mg  40 mg Oral Q breakfast Elgergawy, Silver Huguenin, MD   40 mg at 12/08/20 0823  . sertraline (ZOLOFT) tablet 50 mg  50 mg Oral QHS Elgergawy, Silver Huguenin, MD   50 mg at 12/07/20 2125  . tamsulosin (FLOMAX) capsule 0.4 mg  0.4 mg Oral Daily Elgergawy, Silver Huguenin, MD   0.4 mg at 12/07/20 0931  . traMADol (ULTRAM) tablet 50 mg  50 mg Oral TID PRN Elgergawy, Silver Huguenin, MD   50 mg at 12/08/20 0048  . Warfarin - Pharmacist Dosing Inpatient   Does not apply [place], [facility]   Given at 12/07/20 1645   Allergies:  Penicillins, Ciprofloxacin, and Ibuprofen   Social History: The patient  reports that she has been smoking cigarettes. She started smoking about 42 years ago. She has been smoking about 0.75 packs per day. She has never used smokeless tobacco. She reports that she does not drink alcohol and does not use drugs.   Family History: The patient's family history includes Diabetes in her father and mother; Hypertension in her father and mother.   ROS:  Please see the history of present illness. Otherwise, complete review of systems is positive for none.  All other systems are reviewed and negative.   Physical Exam: VS:  There were no vitals taken for this visit., BMI There is no height or weight on file to calculate BMI.  Wt Readings from Last 3 Encounters:  12/08/20 214 lb 15.2 oz (97.5 kg)  11/11/20 218 lb 12.8 oz (99.2 kg)  10/28/20 222 lb 9.6 oz (101 kg)    General: Morbidly obese patient appears comfortable at rest. Neck: Supple, no elevated JVP or carotid bruits, no thyromegaly.  Has tracheostomy in place. Lungs: Mild inspiratory and expiratory  wheezes noted., nonlabored breathing at rest. Cardiac: Regular rate and rhythm, no S3 or significant systolic murmur, no pericardial rub. Extremities: Mild pitting edema, distal pulses 2+. Skin: Warm and dry. Musculoskeletal: No kyphosis. Neuropsychiatric: Alert and oriented x3, affect grossly  appropriate.  ECG:  EKG October 28, 2020 normal sinus rhythm rate of 72, anterior infarct, age undetermined, marked ST abnormality, possible lateral subendocardial injury.  Recent Labwork: 08/01/2020: TSH 3.750 08/05/2020: Magnesium 2.7 12/05/2020: B Natriuretic Peptide 695.0 12/08/2020: ALT 20; AST 13; BUN 41; Creatinine, Ser 1.49; Hemoglobin 12.8; Platelets 269; Potassium 3.1; Sodium 139     Component Value Date/Time   CHOL 221 (H) 05/05/2020 0928   TRIG 165 (H) 05/05/2020 0928   HDL 71 05/05/2020 0928   CHOLHDL 3.1 05/05/2020 0928   CHOLHDL 3.8 03/24/2018 0451   VLDL 20 03/24/2018 0451   LDLCALC 121 (H) 05/05/2020 0928    Other Studies Reviewed Today:  Echocardiogram 07/02/2020 1. Difficult asssessment of LVEF in setting of aflutter as well as limited visualization. . Left ventricular ejection fraction, by estimation, is 50%. The left ventricle has mildly decreased function. The left ventricle demonstrates global hypokinesis. There is mild left ventricular hypertrophy. Left ventricular diastolic parameters are indeterminate. 2. Right ventricular systolic function was not well visualized. The right ventricular size is not well visualized. 3. Left atrial size was mildly dilated. 4. The mitral valve is normal in structure. No evidence of mitral valve regurgitation. No evidence of mitral stenosis. 5. The aortic valve is tricuspid. Aortic valve regurgitation is not visualized. No aortic stenosis is present. 6. The inferior vena cava is normal in size with greater than 50% respiratory variability, suggesting right atrial pressure of 3 mmHg.   03/24/2018 RIGHT/LEFT HEART CATH AND CORONARY/GRAFT ANGIOGRAPHY   Conclusion   Severe native coronary artery disease with total occlusion of the proximal LAD, total occlusion of the proximal RCA, and patent circumflex with patent proximal stent with eccentric 50% proximal narrowing.  Distal obtuse marginal branches are severely and diffusely  diseased.  Bypass graft failure with total occlusion of SVG to RCA.  Patent LIMA to LAD.  Right coronary territory is supplied by collaterals from LAD and circumflex.  Left ventricular systolic dysfunction with EF 35 to 45%.  Mid to distal inferior wall akinesis.  Inferobasal and inferoapical wall severely hypokinetic.  Left ventricular end-diastolic pressure elevated at 19 mmHg  Moderate to severe pulmonary hypertension  RECOMMENDATIONS:   Resume Coumadin therapy overlap with heparin  Continue Plavix  Management of pulmonary status to maintain adequate oxygenation which may help decrease severity of pulmonary hypertension.   Recommend to resume Warfarin, at currently prescribed dose and frequency, on today.  Recommend concurrent antiplatelet therapy of Clopidogrel 75mg  daily for Indefinite  Diagnostic Dominance: Right       Assessment and Plan:  1. Chronic combined systolic (congestive) and diastolic (congestive) heart failure ([place])   2. Essential hypertension   3. CAD in native artery   4. History of DVT (deep vein thrombosis)   5. Atrial fibrillation, unspecified type ([place])    1. Chronic combined systolic (congestive) and diastolic (congestive) heart failure (HCC) She has lost some weight about 4 pounds since last check.  Weight today in clinic was 218.  However she states at home she weighed 216 pounds this morning.  Continue torsemide 60 mg a.m. and 60 mg p.m.  Continue metolazone 5 mg every Tuesday and Wednesday.  Continue digoxin 0.125 mg daily.  Continue potassium 40 mg p.o. daily.  Please  get a basic metabolic panel and magnesium.  2. Essential hypertension Blood pressure well controlled today at 118/62.  3. CAD in native artery No anginal or exertional symptoms or nitroglycerin use.  Continue nitroglycerin as needed.  Continue atorvastatin 20 mg daily.  4. History of DVT (deep vein thrombosis) No DVT or PE-like symptoms.  Continue Coumadin as  directed.  5. Atrial fibrillation, unspecified type ([place]) Heart rate is regular today rate of 61.  Continue Coumadin 2.5 mg Monday Wednesday and Friday and 1.2 mg every other day.  Follow with Coumadin clinic.  Medication Adjustments/Labs and Tests Ordered: Current medicines are reviewed at length with the patient today.  Concerns regarding medicines are outlined above.   Disposition: Follow-up with Dr. Harl Bowie or APP 1 month  Signed, Levell July, NP 12/08/2020 8:37 AM    [facility] [address], [place],  [address] Phone: [phone]; Fax: [phone]

## 2020-12-08 NOTE — TOC Initial Note (Addendum)
Transition of Care [facility]) - Initial/Assessment Note    Patient Details  Name: Theresa [name] MRN: [id number] Date of Birth: [birth year]  Transition of Care [facility]) CM/SW Contact:    Ihor Gully, LCSW Phone Number: 12/08/2020, 5:04 PM  Clinical Narrative:                 Patient from home with spouse. Considered high risk for readmission. At baseline uses a walker. Has wc. Has hospital bed. On 4-5L oxygen through [employer].  Active with [facility] RN/PT.  "Tries to" follow heart healthy diet. Takes daily weights. Sees cardiologist on a regular basis.  Spouse wants her to discharge home when medically stable.  TOC will continue to follow.   Expected Discharge Plan: [place] Barriers to Discharge: Continued Medical Work up   Patient Goals and CMS Choice Patient states their goals for this hospitalization and ongoing recovery are:: return home with [facility]      Expected Discharge Plan and Services Expected Discharge Plan: [place] Acute Care Choice: [place] arrangements for the past 2 months: Single Family Home                                      Prior Living Arrangements/Services Living arrangements for the past 2 months: Single Family Home Lives with:: Spouse Patient language and need for interpreter reviewed:: Yes Do you feel safe going back to the place where you live?: Yes      Need for Family Participation in Patient Care: Yes (Comment) Care giver support system in place?: Yes (comment) Current home services: DME,Home RN,Home PT Criminal Activity/Legal Involvement Pertinent to Current Situation/Hospitalization: No - Comment as needed  Activities of Daily Living Home Assistive Devices/Equipment: Oxygen,Walker (specify type),Wheelchair,Shower chair with back ADL Screening (condition at time of admission) Patient's cognitive ability adequate to safely complete daily activities?: Yes Is the patient deaf or have  difficulty hearing?: No Does the patient have difficulty seeing, even when wearing glasses/contacts?: No Does the patient have difficulty concentrating, remembering, or making decisions?: No Patient able to express need for assistance with ADLs?: Yes Does the patient have difficulty dressing or bathing?: No Independently performs ADLs?: Yes (appropriate for developmental age) Does the patient have difficulty walking or climbing stairs?: Yes (due to oxygen requirements) Weakness of Legs: None Weakness of Arms/Hands: None  Permission Sought/Granted Permission sought to share information with : Family Supports    Share Information with NAME: Gertie Broerman     Permission granted to share info w Relationship: spouse     Emotional Assessment Appearance:: Appears younger than stated age     Orientation: : Oriented to Place,Oriented to Self,Oriented to  Time,Oriented to Situation Alcohol / Substance Use: Not Applicable Psych Involvement: No (comment)  Admission diagnosis:  Acute on chronic respiratory failure (HCC) [J96.20] Acute on chronic heart failure, unspecified heart failure type [facility]) [I50.9] Patient Active Problem List   Diagnosis Date Noted  . Rectal bleeding   . Constipation   . Abdominal tenderness without rebound tenderness   . Acute renal failure superimposed on stage 3b chronic kidney disease ([place]) 08/01/2020  . Acute renal failure superimposed on stage 3a chronic kidney disease ([place]) 08/01/2020  . Acute on chronic respiratory failure ([place]) 07/21/2020  . Hypoxemia   . Atrial flutter with rapid ventricular response ([place])   . Sepsis due  to Escherichia coli (E. coli) ([place]) 06/16/2020  . Bacteremia due to Gram-negative bacteria 06/16/2020  . Severe sepsis ([place]) 06/15/2020  . Biliary colic 26/94/8546  . Abdominal pain 06/15/2020  . Transaminitis 06/15/2020  . Sepsis due to undetermined organism ([place]) 06/15/2020  . Systolic and diastolic CHF, chronic ([place]) 06/15/2020  .  CHF (congestive heart failure) ([place]) 04/18/2020  . Acute respiratory failure ([place]) 04/17/2020  . Chronic venous insufficiency 06/19/2019  . Cholelithiasis 05/07/2019  . Adult body mass index 50.0-59.9 ([place]) 03/22/2019  . Anxiety 03/22/2019  . Low back pain 03/22/2019  . Cigarette nicotine dependence without complication 27/11/5007  . History of vitamin D deficiency 03/22/2019  . Insomnia 03/22/2019  . Knee pain 03/22/2019  . Localized, primary osteoarthritis of lower leg, unspecified laterality 03/22/2019  . Neck pain 03/22/2019  . Psoriasis 03/22/2019  . Thrombophlebitis of deep veins of lower extremity ([place]) 03/22/2019  . DVT (deep venous thrombosis) ([place]) 03/22/2019  . Fracture of humeral shaft, left, closed 03/22/2019  . Chronic respiratory insufficiency 01/19/2019  . CAD (coronary artery disease) 01/15/2019  . Educated about COVID-19 virus infection 01/12/2019  . Acute on chronic combined systolic and diastolic HF (heart failure) ([place]) 01/12/2019  . Acute on chronic respiratory failure with hypoxia ([place]) 10/24/2018  . Pulmonary hypertension, unspecified ([place])   . Acute on chronic respiratory failure with hypoxia ([place]) 08/01/2017  . Uncontrolled type 2 diabetes mellitus with hyperglycemia, with long-term current use of insulin ([place]) 08/01/2017  . Dyspnea 07/31/2017  . Nausea & vomiting   . Acute on chronic congestive heart failure ([place])   . Acute respiratory failure with hypoxia ([place]) 01/22/2016  . Hyperglycemia due to diabetes mellitus ([place]) 01/22/2016  . CAD (coronary artery disease) of artery bypass graft: PTCA/DES to distal body VG to PDA 10/29/15 11/03/2015  . Tracheostomy in place [facility]), chronic since 2002 11/03/2015  . Hypokalemia 11/03/2015  . Lupus anticoagulant syndrome ([place])   . Chronic diastolic CHF (congestive heart failure) ([place])   . Respiratory failure ([place])   . Coronary artery disease involving coronary bypass graft of native heart without angina pectoris   .  OSA (obstructive sleep apnea)   . COPD exacerbation ([place])   . Tobacco abuse   . DM (diabetes mellitus) ([place]) 06/11/2009  . Hyperlipidemia LDL goal <70 06/11/2009  . Acute thromboembolism of deep veins of lower extremity ([place]) 06/11/2009  . History of CABG 06/11/2009  . Essential hypertension 05/27/2009  . CAD, NATIVE VESSEL 05/27/2009   PCP:  Monico Blitz, MD Pharmacy:   [facility] [address] [place] [address] Phone: [phone] Fax: [phone]     Social Determinants of Health (SDOH) Interventions    Readmission Risk Interventions Readmission Risk Prevention Plan 07/22/2020 05/17/2019  Transportation Screening Complete Complete  PCP or Specialist Appt within 3-5 Days - Complete  HRI or [place] - Complete  Social Work Consult for [place] Planning/Counseling - Complete  Palliative Care Screening - Not Applicable  Medication Review Press photographer) Complete Complete  Palliative Care Screening Not Complete -  [place] Not Applicable -  Some recent data might be hidden

## 2020-12-09 ENCOUNTER — Ambulatory Visit: Payer: Medicare Other | Admitting: Family Medicine

## 2020-12-09 DIAGNOSIS — I272 Pulmonary hypertension, unspecified: Secondary | ICD-10-CM

## 2020-12-09 DIAGNOSIS — I4892 Unspecified atrial flutter: Secondary | ICD-10-CM | POA: Diagnosis not present

## 2020-12-09 DIAGNOSIS — I5043 Acute on chronic combined systolic (congestive) and diastolic (congestive) heart failure: Secondary | ICD-10-CM

## 2020-12-09 DIAGNOSIS — J9621 Acute and chronic respiratory failure with hypoxia: Secondary | ICD-10-CM | POA: Diagnosis not present

## 2020-12-09 DIAGNOSIS — F17210 Nicotine dependence, cigarettes, uncomplicated: Secondary | ICD-10-CM | POA: Diagnosis not present

## 2020-12-09 DIAGNOSIS — I2510 Atherosclerotic heart disease of native coronary artery without angina pectoris: Secondary | ICD-10-CM | POA: Diagnosis not present

## 2020-12-09 DIAGNOSIS — J9622 Acute and chronic respiratory failure with hypercapnia: Secondary | ICD-10-CM

## 2020-12-09 LAB — BLOOD GAS, ARTERIAL
Acid-Base Excess: 20.4 mmol/L — ABNORMAL HIGH (ref 0.0–2.0)
Bicarbonate: 42.5 mmol/L — ABNORMAL HIGH (ref 20.0–28.0)
FIO2: 40
O2 Saturation: 92.1 %
Patient temperature: 37
pCO2 arterial: 66.6 mmHg (ref 32.0–48.0)
pH, Arterial: 7.454 — ABNORMAL HIGH (ref 7.350–7.450)
pO2, Arterial: 66.3 mmHg — ABNORMAL LOW (ref 83.0–108.0)

## 2020-12-09 LAB — CBC
HCT: 45.3 % (ref 36.0–46.0)
Hemoglobin: 13.8 g/dL (ref 12.0–15.0)
MCH: 29.6 pg (ref 26.0–34.0)
MCHC: 30.5 g/dL (ref 30.0–36.0)
MCV: 97 fL (ref 80.0–100.0)
Platelets: 255 10*3/uL (ref 150–400)
RBC: 4.67 MIL/uL (ref 3.87–5.11)
RDW: 16.2 % — ABNORMAL HIGH (ref 11.5–15.5)
WBC: 10.9 10*3/uL — ABNORMAL HIGH (ref 4.0–10.5)
nRBC: 0 % (ref 0.0–0.2)

## 2020-12-09 LAB — COMPREHENSIVE METABOLIC PANEL
ALT: 19 U/L (ref 0–44)
AST: 15 U/L (ref 15–41)
Albumin: 2.9 g/dL — ABNORMAL LOW (ref 3.5–5.0)
Alkaline Phosphatase: 92 U/L (ref 38–126)
Anion gap: 13 (ref 5–15)
BUN: 42 mg/dL — ABNORMAL HIGH (ref 6–20)
CO2: 44 mmol/L — ABNORMAL HIGH (ref 22–32)
Calcium: 8.5 mg/dL — ABNORMAL LOW (ref 8.9–10.3)
Chloride: 83 mmol/L — ABNORMAL LOW (ref 98–111)
Creatinine, Ser: 1.45 mg/dL — ABNORMAL HIGH (ref 0.44–1.00)
GFR, Estimated: 43 mL/min — ABNORMAL LOW (ref >60)
Glucose, Bld: 57 mg/dL — ABNORMAL LOW (ref 70–99)
Potassium: 3.7 mmol/L (ref 3.5–5.1)
Sodium: 140 mmol/L (ref 135–145)
Total Bilirubin: 1.2 mg/dL (ref 0.3–1.2)
Total Protein: 6.5 g/dL (ref 6.5–8.1)

## 2020-12-09 LAB — PROTIME-INR
INR: 1.8 — ABNORMAL HIGH (ref 0.8–1.2)
Prothrombin Time: 20.2 seconds — ABNORMAL HIGH (ref 11.4–15.2)

## 2020-12-09 LAB — GLUCOSE, CAPILLARY
Glucose-Capillary: 68 mg/dL — ABNORMAL LOW (ref 70–99)
Glucose-Capillary: 132 mg/dL — ABNORMAL HIGH (ref 70–99)
Glucose-Capillary: 280 mg/dL — ABNORMAL HIGH (ref 70–99)
Glucose-Capillary: 295 mg/dL — ABNORMAL HIGH (ref 70–99)

## 2020-12-09 MED ORDER — WARFARIN SODIUM 2.5 MG PO TABS
2.5000 mg | ORAL_TABLET | Freq: Once | ORAL | Status: AC
  Administered 2020-12-09: 2.5 mg via ORAL
  Filled 2020-12-09: qty 1

## 2020-12-09 MED ORDER — INSULIN GLARGINE 100 UNIT/ML ~~LOC~~ SOLN
16.0000 [IU] | Freq: Two times a day (BID) | SUBCUTANEOUS | Status: DC
  Administered 2020-12-09 – 2020-12-11 (×4): 16 [IU] via SUBCUTANEOUS
  Filled 2020-12-09 (×6): qty 0.16

## 2020-12-09 MED ORDER — FUROSEMIDE 10 MG/ML IJ SOLN
80.0000 mg | Freq: Two times a day (BID) | INTRAMUSCULAR | Status: AC
  Administered 2020-12-09 – 2020-12-11 (×4): 80 mg via INTRAVENOUS
  Filled 2020-12-09 (×4): qty 8

## 2020-12-09 MED ORDER — SPIRONOLACTONE 25 MG PO TABS
25.0000 mg | ORAL_TABLET | Freq: Two times a day (BID) | ORAL | Status: DC
  Administered 2020-12-09 – 2020-12-12 (×6): 25 mg via ORAL
  Filled 2020-12-09 (×6): qty 1

## 2020-12-09 NOTE — Progress Notes (Signed)
ANTICOAGULATION CONSULT NOTE -  Pharmacy Consult for Warfarin Indication: VTE Treatment - Continuing PTA  Allergies  Allergen Reactions  . Penicillins Rash    Has patient had a PCN reaction causing immediate rash, facial/tongue/throat swelling, SOB or lightheadedness with hypotension: Yes Has patient had a PCN reaction causing severe rash involving mucus membranes or skin necrosis: No Has patient had a PCN reaction that required hospitalization No Has patient had a PCN reaction occurring within the last 10 years: No If all of the above answers are "NO", then may proceed with Cephalosporin use.   REACTION: rash Pt has tolerated cefepime in the past.  . Ciprofloxacin     nausea  . Ibuprofen Rash    swelling in leg     Patient Measurements: Height: 4\' 10"  (147.3 cm) Weight: 97.3 kg (214 lb 8.1 oz) IBW/kg (Calculated) : 40.9  Vital Signs: Temp: 98.6 F (37 C) (04/05 1149) Temp Source: Oral (04/05 1149) BP: 102/51 (04/05 0804) Pulse Rate: 57 (04/05 1209)  Labs: Recent Labs    12/07/20 0514 12/08/20 0313 12/09/20 0628  HGB 12.7 12.8 13.8  HCT 41.1 42.0 45.3  PLT 224 269 255  LABPROT 31.1* 31.5* 20.2*  INR 3.1* 3.2* 1.8*  CREATININE 1.44* 1.49* 1.45*    Estimated Creatinine Clearance: 43.9 mL/min (A) (by C-G formula based on SCr of 1.45 mg/dL (H)).   Medical History: Past Medical History:  Diagnosis Date  . Acute kidney failure with lesion of tubular necrosis (HCC)   . Acute systolic heart failure ([place])   . CAD (coronary artery disease)    a. s/p prior PCI. b. CABG 2007 at [facility] in [place] 2007. c. inferior STEMI 10/2015 s/p DES to dSVG-PDA.  . Cardiac arrest ([place])   . Cervical cancer ([place])   . Chronic diastolic CHF (congestive heart failure) ([place])   . Chronic respiratory failure ([place])    s/p tracheostomy 2002  . Chronic RUQ pain   . COPD (chronic obstructive pulmonary disease) ([place])   . Diabetes mellitus ([place])   . DVT (deep venous thrombosis)  ([place])   . Endometriosis   . History of gallstones 01/2016   seen on Ultrasound  . History of HIDA scan 11/2016   normal  . HTN (hypertension)   . Hyperlipidemia   . Lupus anticoagulant disorder (HCC)    on coumadin  . Morbid obesity ([place])   . Psoriasis   . ST elevation (STEMI) myocardial infarction involving right coronary artery ([place]) 10/29/15   stent to VG to PDA  . Toe fracture, right 03/29/2018  . Tracheostomy in place [facility]), chronic since 2002 11/03/2015     Assessment: 55 yo female presented on 12/05/2020 with SOB, diarrhea and leg swelling. Patient is on warfarin prior to admission for lupus anticoagulant with history of VTE and Afib.    Prior to admission regimen (per RN who discussed w/ pt): warfarin 2.5mg  daily except 5mg  on Mondays/Fridays. Managed per PCP.   INR 2.8 is therapeutic. Patient reported last dose 3/30. No dose on 3/31 per pt due to "Dr telling her not to take it since she was too thin". Suspect this means patient had a high INR but unable to confirm since no notes available in [facility] or Care Everywhere. Patient also started on azithromycin which can increase INR.    INR 3.2> 1.8   Goal of Therapy:  INR 2-3 Monitor platelets by anticoagulation protocol: Yes   Plan:  Warfarin 2.5mg  po x 1 today Monitor INR, CBC  and s/s of bleeding daily  Isac Sarna, BS [place], [place] Clinical Pharmacist Pager [phone] 12/09/2020,12:17 PM

## 2020-12-09 NOTE — Consult Note (Signed)
Cardiology Consultation:   Patient ID: Theresa [name] MRN: [id number]; DOB: Feb 08, 1966  Admit date: 12/05/2020 Date of Consult: 12/09/2020  PCP:  Monico Blitz, [place]  Cardiologist:  [name], MD    Patient Profile:   Theresa [name] is a 55 y.o. female with past medical history of CAD (s/p CABG in 2007, DES to SVG-PDA in 10/2015, cath in 03/2018 showing patent LIMA-LAD and total occlusion of SVG-RCA and RCA territory supplied by collaterals from LAD and LCx), tracheostomy (placed in 2002), chronic combined systolic and diastolic CHF, paroxysmal atrial flutter, HTN, HLD, COPD, continued tobacco use, and prior DVT (known lupus anticoagulant) who is being seen today for the evaluation of CHF at the request of Dr. Roger Shelter.  History of Present Illness:   Theresa [name] with past medical history of CAD (s/p CABG in 2007, DES to SVG-PDA in 10/2015, cath in 03/2018 showing patent LIMA-LAD and total occlusion of SVG-RCA and RCA territory supplied by collaterals from LAD and LCx), tracheostomy (placed in 2002), chronic combined systolic and diastolic CHF, paroxysmal atrial flutter, HTN, HLD, COPD, continued tobacco use, and prior DVT (known lupus anticoagulant) admitted with SOB and LE edema. She reports diuretic compliance. Reports not adhering to low sodium diet. No significant chest pain, no palpitations.      Past Medical History:  Diagnosis Date  . Acute kidney failure with lesion of tubular necrosis (HCC)   . Acute systolic heart failure ([place])   . CAD (coronary artery disease)    a. s/p prior PCI. b. CABG 2007 at [facility] in [place] 2007. c. inferior STEMI 10/2015 s/p DES to dSVG-PDA.  . Cardiac arrest ([place])   . Cervical cancer ([place])   . Chronic diastolic CHF (congestive heart failure) ([place])   . Chronic respiratory failure ([place])    s/p tracheostomy 2002  . Chronic RUQ pain   . COPD (chronic obstructive pulmonary disease) ([place])   .  Diabetes mellitus ([place])   . DVT (deep venous thrombosis) ([place])   . Endometriosis   . History of gallstones 01/2016   seen on Ultrasound  . History of HIDA scan 11/2016   normal  . HTN (hypertension)   . Hyperlipidemia   . Lupus anticoagulant disorder (HCC)    on coumadin  . Morbid obesity ([place])   . Psoriasis   . ST elevation (STEMI) myocardial infarction involving right coronary artery ([place]) 10/29/15   stent to VG to PDA  . Toe fracture, right 03/29/2018  . Tracheostomy in place [facility]), chronic since 2002 11/03/2015    Past Surgical History:  Procedure Laterality Date  . CARDIAC CATHETERIZATION N/A 10/29/2015   Procedure: Left Heart Cath and Cors/Grafts Angiography;  Surgeon: Burnell Blanks, MD;  Location: [place] [facility];  Service: Cardiovascular;  Laterality: N/A;  . CARDIAC CATHETERIZATION  10/29/2015   Procedure: Coronary Stent Intervention;  Surgeon: Burnell Blanks, MD;  Location: [place] [facility];  Service: Cardiovascular;;  . CAROTID STENT    . CESAREAN SECTION WITH BILATERAL TUBAL LIGATION    . CORONARY ARTERY BYPASS GRAFT  2007   2V  . IR GASTROSTOMY TUBE MOD SED  11/13/2018  . RIGHT/LEFT HEART CATH AND CORONARY/GRAFT ANGIOGRAPHY N/A 03/24/2018   Procedure: RIGHT/LEFT HEART CATH AND CORONARY/GRAFT ANGIOGRAPHY;  Surgeon: Belva Crome, MD;  Location: [place] [facility];  Service: Cardiovascular;  Laterality: N/A;  . TRACHEOSTOMY        Inpatient Medications: Scheduled Meds: .  acidophilus  1 capsule Oral TID  . atorvastatin  20 mg Oral Daily  . azithromycin  500 mg Oral Daily  . Chlorhexidine Gluconate Cloth  6 each Topical Daily  . digoxin  125 mcg Oral Daily  . furosemide  60 mg Intravenous Q12H  . gabapentin  600 mg Oral QHS  . guaiFENesin-dextromethorphan  10 mL Oral Q8H  . insulin aspart  0-9 Units Subcutaneous TID WC  . insulin glargine  20 Units Subcutaneous BID  . ipratropium-albuterol  3 mL Nebulization TID  . levothyroxine  50 mcg Oral  Q0600  . metolazone  5 mg Oral Q72H  . metoprolol succinate  25 mg Oral Daily  . mometasone  1 application Topical Daily  . pantoprazole  40 mg Oral Daily  . potassium chloride SA  40 mEq Oral Daily  . predniSONE  40 mg Oral Q breakfast  . sertraline  50 mg Oral QHS  . tamsulosin  0.4 mg Oral Daily  . Warfarin - Pharmacist Dosing Inpatient   Does not apply q1600   Continuous Infusions:  PRN Meds: acetaminophen **OR** acetaminophen, albuterol, LORazepam, nitroGLYCERIN, polyethylene glycol, traMADol  Allergies:    Allergies  Allergen Reactions  . Penicillins Rash    Has patient had a PCN reaction causing immediate rash, facial/tongue/throat swelling, SOB or lightheadedness with hypotension: Yes Has patient had a PCN reaction causing severe rash involving mucus membranes or skin necrosis: No Has patient had a PCN reaction that required hospitalization No Has patient had a PCN reaction occurring within the last 10 years: No If all of the above answers are "NO", then may proceed with Cephalosporin use.   REACTION: rash Pt has tolerated cefepime in the past.  . Ciprofloxacin     nausea  . Ibuprofen Rash    swelling in leg     Social History:   Social History   Socioeconomic History  . Marital status: Married    Spouse name: Not on file  . Number of children: Not on file  . Years of education: Not on file  . Highest education level: Not on file  Occupational History  . Not on file  Tobacco Use  . Smoking status: Current Every Day Smoker    Packs/day: 0.75    Types: Cigarettes    Start date: 10/23/1978  . Smokeless tobacco: Never Used  . Tobacco comment: 1/2 pack - 1 pack - trying to cut back   Vaping Use  . Vaping Use: Never used  Substance and Sexual Activity  . Alcohol use: No    Alcohol/week: 0.0 standard drinks  . Drug use: No  . Sexual activity: Not on file  Other Topics Concern  . Not on file  Social History Narrative  . Not on file   Social  Determinants of Health   Financial Resource Strain: Not on file  Food Insecurity: Not on file  Transportation Needs: Not on file  Physical Activity: Not on file  Stress: Not on file  Social Connections: Not on file  Intimate Partner Violence: Not on file    Family History:    Family History  Problem Relation Age of Onset  . Hypertension Mother   . Diabetes Mother   . Hypertension Father   . Diabetes Father   . Colon cancer Neg Hx   . Colon polyps Neg Hx      ROS:  Please see the history of present illness.  All other ROS reviewed and negative.  Physical Exam/Data:   Vitals:   12/09/20 0700 12/09/20 0731 12/09/20 0800 12/09/20 0804  BP: (!) 109/51  (!) 102/51 (!) 102/51  Pulse: (!) 28 (!) 57 (!) 41 (!) 55  Resp: 18 15 16  (!) 25  Temp:  98.5 F (36.9 C)    TempSrc:  Oral    SpO2: 92% 92% 96% 94%  Weight:      Height:        Intake/Output Summary (Last 24 hours) at 12/09/2020 1003 Last data filed at 12/09/2020 0500 Gross per 24 hour  Intake --  Output 2400 ml  Net -2400 ml   Last 3 Weights 12/09/2020 12/08/2020 12/07/2020  Weight (lbs) 214 lb 8.1 oz 214 lb 15.2 oz 213 lb 6.5 oz  Weight (kg) 97.3 kg 97.5 kg 96.8 kg     Body mass index is 44.83 kg/m.  General:  Well nourished, well developed, in no acute distress HEENT: normal Lymph: no adenopathy Neck: no JVD Endocrine:  No thryomegaly Vascular: No carotid bruits; FA pulses 2+ bilaterally without bruits  Cardiac:  normal S1, S2; RRR; no murmur Lungs:  Bilateral crackles Abd: soft, nontender, no hepatomegaly  Ext: trace bilateral edema Musculoskeletal:  No deformities, BUE and BLE strength normal and equal Skin: warm and dry  Neuro:  CNs 2-12 intact, no focal abnormalities noted Psych:  Normal affect  elemetry was personally reviewed and demonstrates:    Relevant CV Studies:  Cardiac Catheterization: 03/2018  Severe native coronary artery disease with total occlusion of the proximal LAD, total occlusion  of the proximal RCA, and patent circumflex with patent proximal stent with eccentric 50% proximal narrowing.  Distal obtuse marginal branches are severely and diffusely diseased.  Bypass graft failure with total occlusion of SVG to RCA.  Patent LIMA to LAD.  Right coronary territory is supplied by collaterals from LAD and circumflex.  Left ventricular systolic dysfunction with EF 35 to 45%.  Mid to distal inferior wall akinesis.  Inferobasal and inferoapical wall severely hypokinetic.  Left ventricular end-diastolic pressure elevated at 19 mmHg  Moderate to severe pulmonary hypertension  RECOMMENDATIONS:   Resume Coumadin therapy overlap with heparin  Continue Plavix  Management of pulmonary status to maintain adequate oxygenation which may help decrease severity of pulmonary hypertension.   Recommend to resume Warfarin, at currently prescribed dose and frequency, on today.  Recommend concurrent antiplatelet therapy of Clopidogrel 75mg  daily for Indefinite.   Echocardiogram: 06/2020 IMPRESSIONS    1. Difficult asssessment of LVEF in setting of aflutter as well as  limited visualization. . Left ventricular ejection fraction, by  estimation, is 50%. The left ventricle has mildly decreased function. The  left ventricle demonstrates global hypokinesis.  There is mild left ventricular hypertrophy. Left ventricular diastolic  parameters are indeterminate.  2. Right ventricular systolic function was not well visualized. The right  ventricular size is not well visualized.  3. Left atrial size was mildly dilated.  4. The mitral valve is normal in structure. No evidence of mitral valve  regurgitation. No evidence of mitral stenosis.  5. The aortic valve is tricuspid. Aortic valve regurgitation is not  visualized. No aortic stenosis is present.  6. The inferior vena cava is normal in size with greater than 50%  respiratory variability, suggesting right atrial pressure of 3  mmHg.    Limited Echo: 12/2020 IMPRESSIONS    1. Left ventricular ejection fraction, by estimation, is 45 to 50%. The  left ventricle has mildly decreased function. The left ventricle  demonstrates global hypokinesis. There is mild left ventricular  hypertrophy. Left ventricular diastolic parameters  were normal.  2. Right ventricular systolic function is normal. The right ventricular  size is normal. There is moderately elevated pulmonary artery systolic  pressure. The estimated right ventricular systolic pressure is 76.1 mmHg.  3. The mitral valve is normal in structure. No evidence of mitral valve  regurgitation. No evidence of mitral stenosis.  4. The aortic valve is normal in structure. Aortic valve regurgitation is  not visualized. No aortic stenosis is present.  5. The inferior vena cava is normal in size with greater than 50%  respiratory variability, suggesting right atrial pressure of 3 mmHg.   Comparison(s): No significant change from prior study. Prior images  reviewed side by side.  Laboratory Data:  High Sensitivity Troponin:   Recent Labs  Lab 12/05/20 1714 12/05/20 1856  TROPONINIHS 15 14     Chemistry Recent Labs  Lab 12/07/20 0514 12/08/20 0313 12/09/20 0628  NA 137 139 140  K 3.3* 3.1* 3.7  CL 87* 87* 83*  CO2 37* 39* 44*  GLUCOSE 227* 52* 57*  BUN 36* 41* 42*  CREATININE 1.44* 1.49* 1.45*  CALCIUM 8.2* 8.4* 8.5*  GFRNONAA 43* 41* 43*  ANIONGAP 13 13 13     Recent Labs  Lab 12/07/20 0514 12/08/20 0313 12/09/20 0628  PROT 6.4* 6.3* 6.5  ALBUMIN 2.6* 2.7* 2.9*  AST 11* 13* 15  ALT 19 20 19   ALKPHOS 102 95 92  BILITOT 0.8 0.7 1.2   Hematology Recent Labs  Lab 12/07/20 0514 12/08/20 0313 12/09/20 0628  WBC 12.0* 14.0* 10.9*  RBC 4.27 4.39 4.67  HGB 12.7 12.8 13.8  HCT 41.1 42.0 45.3  MCV 96.3 95.7 97.0  MCH 29.7 29.2 29.6  MCHC 30.9 30.5 30.5  RDW 16.7* 16.7* 16.2*  PLT 224 269 255   BNP Recent Labs  Lab  12/05/20 1714  BNP 695.0*    DDimer No results for input(s): DDIMER in the last 168 hours.   Radiology/Studies:  DG Chest 1 View  Result Date: 12/05/2020 CLINICAL DATA:  55 year old female with shortness of breath. EXAM: CHEST  1 VIEW COMPARISON:  Chest radiograph dated 10/09/2020. FINDINGS: Tracheostomy with tip above the carina. There is cardiomegaly with vascular congestion. Pneumonia is not excluded. Overall improved congestion compared to the prior radiograph. No focal consolidation or pneumothorax. No large pleural effusion. Median sternotomy wires. No acute osseous pathology. IMPRESSION: Cardiomegaly with vascular congestion. Electronically Signed   By: Anner Crete M.D.   On: 12/05/2020 17:32   DG CHEST PORT 1 VIEW  Result Date: 12/07/2020 CLINICAL DATA:  Tracheostomy, dyspnea, CHF EXAM: PORTABLE CHEST 1 VIEW COMPARISON:  Chest radiograph from one day prior. FINDINGS: Tracheostomy tube tip overlies the tracheal air column at the thoracic inlet. Intact sternotomy wires. Stable cardiomediastinal silhouette with moderate cardiomegaly. No pneumothorax. Probable trace bilateral pleural effusions. Suture line at the right costophrenic angle again noted. Mild pulmonary edema. No acute consolidative airspace disease. IMPRESSION: 1. Mild congestive heart failure. 2. Probable trace bilateral pleural effusions. Electronically Signed   By: Ilona Sorrel M.D.   On: 12/07/2020 10:15   DG CHEST PORT 1 VIEW  Result Date: 12/06/2020 CLINICAL DATA:  Shortness of breath. EXAM: PORTABLE CHEST 1 VIEW COMPARISON:  12/05/2020 FINDINGS: Tracheostomy tube appears in adequate position. Sternotomy wires unchanged. Lordotic technique is demonstrated as lungs are adequately inflated without lobar consolidation or effusion. Surgical suture line over the right base unchanged. Mild stable prominence  of the pulmonary vasculature which may indicate a mild degree of vascular congestion. Stable cardiomegaly. Remainder of the  exam is unchanged. IMPRESSION: Stable cardiomegaly with suggestion of mild vascular congestion. Electronically Signed   By: Marin Olp M.D.   On: 12/06/2020 08:19    Assessment and Plan:   1. Acute on chronic combined systolic/diastolic HF 2. History of CAD  3. History of afib  Please see attending assessment and plan below.    Risk Assessment/Risk Scores:   For questions or updates, please contact [place] Please consult [email or web address] for contact info under    Signed, Erma Heritage, PA-C  12/09/2020 10:03 AM   Attending note  Patient seen and discussed with PA Ahmed Prima, I agree with her documentation. 55 yo female history of chronic combined systolic/diastolic HF, CAD with prior CABG in 2007, STEMI 10/2015 and had DES to SVG-PDA, COPD, chronic resp failiure with prior trach, lupus anticoag disorder with DVT on coumadin, HTN, HL, DM2 admitted with SOB, leg swelling. From admit notes on her home 4L [place] sats were 70%. From admit was on torsemide 60mg  bid and torsemide 5mg  on Tues and Wed, she reports compliance. Reports poor compliance with low sodium diet.    Admit labs  WBC 8 Hgb 13.4 Plt 220 K 4.3 Cr 1.56 BUN 34 BNP 695 (up from 344) INR 2.8 ABG 7.4/49/66/30 FiO2 48% Lactic acid 2.2-->4 Trop 15-->14 COVID neg  CXR cardiomegaly with vascular congestion 12/06/20 echo LVEF 95-74%, normal diastolic function, normal RV, PASP 52 EKG SR, lateral ST depression chronic   Acute on chronic combined systolic/diastolic HF. Repeat echo essentially stable findings. Incomplete I/Os, she is on lasix 60mg  IV bid and metolazone written as 5mg  every 72 hrs. Overall stable renal function. Weights are trending down. Change lasix to 80mg  iv bid, will dose metolazone oral as needed as opposed to scheduled. Disccused strict low sodium diet at home, will need to titrate up her home oral regimen. From Dr Cherlyn Cushing note baseline weight likely around 207 lbs.  History of afib, off amio due to  low thyroid. Continue current meds History of CAD as reported above, no acute issues.    Carlyle Dolly MD

## 2020-12-09 NOTE — Progress Notes (Signed)
PROGRESS NOTE    Patient: Theresa [name]                            PCP: Monico Blitz, MD                    DOB: 1966-05-20            DOA: 12/05/2020 YQM:[id number]             DOS: 12/09/2020, 11:53 AM   LOS: 4 days   Date of Service: The patient was seen and examined on 12/09/2020  Subjective:   The patient was seen and examined this morning, laying in bed comfortable, still on 10 L of oxygen, satting 90%.  ABG:  7.4/PCO2 66.6/PO2 66.3   Brief Narrative:  Theresa [name]  is a 55 y.o. female, with systolic and diastolic CHF, COPD, continued tobacco abuse, diabetes mellitus type 2, lupus anticoagulant syndrome, coronary artery disease with history of STEMI, hypertension, chronic respiratory failure on 4 L, chronic tracheostomy (since 2002). -Patient presents today secondary to complaints of shortness of breath, over the last week, she reports worsening dyspnea, leg swelling, per EMS she was 70% on her baseline 4 L nasal cannula, in ED she was 82% on 4 L, she is currently requiring ATC at 28% (10 L), denies fever, chills, chest pain, abdominal pain, no dysuria or polyuria, reports she has been taking torsemide as instructed, reports she has been compliant with her low-salt diet as well, . -In ED her chest x-ray significant cardiomegaly with vascular congestion, her BNP elevated at 195, glucose elevated at 314, she was wheezing, dyspnea has improved with IV Lasix and IV Solu-Medrol, [facility] hospitalist consulted to admit.   Assessment & Plan:   Active Problems:   DM (diabetes mellitus) (HCC)   COPD exacerbation (HCC)   Acute on chronic congestive heart failure (HCC)   Acute on chronic combined systolic and diastolic HF (heart failure) (HCC)   CAD (coronary artery disease)   Cigarette nicotine dependence without complication   DVT (deep venous thrombosis) (HCC)   Atrial flutter with rapid ventricular response (HCC)   Acute on chronic respiratory failure (HCC)   Acute on chronic hypoxic  respiratory failure-with history of COPD/CHF -Still having shortness of breath, on 10 L of oxygen via trach -She is on 4 L nasal cannula at baseline,  -Multifactorial likely COPD and CHF exacerbation Still on 10 L of oxygen via trach. last ABG ABG 7.45/PCO2 66.6/PO2 66.3 -We will continue to treat underlying COPD and CHF, -We will continue with IV steroids, IV Lasix, monitoring I's and O's (- 2400 ),    -RT assisting with tapering down patient to baseline -Continue pulmonary toiletry -Bronchodilator treatments -wean Oxygen as tolerated... To her baseline of 4 L -Consulted pulmonology Dr. Melvyn Novas --- appreciate his assistance and input.  COPD exacerbation -We will continue treatment as above - pulmonary toiletry, DuoNeb bronchodilator treatment as needed and scheduled -IV Solu-Medrol--taper down -Patient via trach secretion, pulmonary toiletry, continue mucolytics, -Empiric antibiotics    Acute on chronic systolic/diastolic CHF -Diuresing well -2400 on AV Lasix  --04/20/2020 echo EF 52-84%, grade 3 diastolic dysfunction  -13/24/40 echo EF 50%, global HK, indeterminant diastolic -1/0/2725 echo-ED JF 45--50%, mild decreased LV function global hypokinesis, LVH, -holding bumex today and reeval to restar  -Reports she has been compliant with torsemide, blood pressure remained soft, -Been started on IV Lasix 40 mg every 8 hours will  be continued for today -Monitoring I's and O's ( -1300),  -- Daily weight 100.2-100.4 kg >> 96.8 kg >> 97.5 kg >> 97.3 kg -Echocardiogram for 10/26/2020; reviewed E JF 45-50%, global hypokinesis, LVH, (low voltage EKG-repeating echo to rule out other pathologies aside from CHF)>>> -Following labs, proBNP daily -BNP 695 -Cardiology consulted appreciate input   Mild lactic acidosis -respiratory lactic acidosis  -Hemodynamically stable -No signs of sepsis -We will continue to monitor   Lupus anticoagulant syndrome/ -Currently on IV Solu-Medrol, >>  back to p.o. prednisone will taper down -home medication of prednisone will be continued with taper. -Currently stable on 5 L -Creatinine 1.48, 1.45 this morning    Atrial flutter -Stable -Currently in sinus rhythm -Continue amiodarone, digoxin, Toprol-XL, Coumadin   Lupus anticoagulant syndrome -coumadin with pharmacy assistance  Coronary artery disease -Denies any chest pain -Stable on current meds  Uncontrolled diabetes mellitus type II  with hyperglycemia -CBG 222, 68,  this a.m. -Being Lantus at lower dose>>> titrating Diabetic coordinator consulted appreciate assistance -NovoLog sliding scale -10/11-hemoglobin A1c--8.9 -holding Ozempic   Depression -stable  -Continue sertraline and home dose lorazepam   Lupus anticoagulant syndrome,history of DVT on chronic anticoagulation -coumadin with pharmacy assistance -Monitoring INR daily  GERD -Continue with PPI  Hypothyroidism -stable. Continue with levothyroxine   ---------------------------------------------------------------------------------------------------------------------------------------- Nutritional status:  The patient's BMI is: Body mass index is 44.83 kg/m. I agree with the assessment and plan as outlined  Skin Assessment: I have examined the patient's skin and I agree with the wound assessment as performed by wound care team As outlined     ---------------------------------------------------------------------------------------------------------------------------------------- Cultures; Sputum culture >>>  Influenza A/B negative SARS-CoV-2 negative  Antimicrobials: P.o. azithromycin   Consultants: Cardiologist/pulmonologist   ----------------------------------------------------------------------------------------------------------------------------------------  DVT prophylaxis:  SCD/Compression stockings and GY:IRSWNIOE Code Status:   Code Status: Full Code  Family  Communication: Discussed with patient No family present at bedside The above findings and plan of care has been discussed with patient   in detail,  they expressed understanding and agreement of above. -Advance care planning has been discussed.   Admission status:   Status is: Inpatient  Remains inpatient appropriate because:Inpatient level of care appropriate due to severity of illness   Dispo: The patient is from: Home              Anticipated d/c is to: Home w HH in > 3 days               Patient currently is not medically stable to d/c.   Difficult to place patient No      Level of care: Stepdown   Procedures:   No admission procedures for hospital encounter.     Antimicrobials:  Anti-infectives (From admission, onward)   Start     Dose/Rate Route Frequency Ordered Stop   12/07/20 1230  azithromycin (ZITHROMAX) tablet 500 mg        500 mg Oral Daily 12/07/20 1132     12/05/20 1930  doxycycline (VIBRA-TABS) tablet 100 mg  Status:  Discontinued        100 mg Oral Every 12 hours 12/05/20 1920 12/07/20 1132   12/05/20 1900  azithromycin (ZITHROMAX) 500 mg in sodium chloride 0.9 % 250 mL IVPB  Status:  Discontinued        500 mg 250 mL/hr over 60 Minutes Intravenous Every 24 hours 12/05/20 1848 12/05/20 1920       Medication:  . acidophilus  1 capsule Oral TID  . atorvastatin  20  mg Oral Daily  . azithromycin  500 mg Oral Daily  . Chlorhexidine Gluconate Cloth  6 each Topical Daily  . digoxin  125 mcg Oral Daily  . furosemide  60 mg Intravenous Q12H  . gabapentin  600 mg Oral QHS  . guaiFENesin-dextromethorphan  10 mL Oral Q8H  . insulin aspart  0-9 Units Subcutaneous TID WC  . insulin glargine  16 Units Subcutaneous BID  . ipratropium-albuterol  3 mL Nebulization TID  . levothyroxine  50 mcg Oral Q0600  . metolazone  5 mg Oral Q72H  . metoprolol succinate  25 mg Oral Daily  . mometasone  1 application Topical Daily  . pantoprazole  40 mg Oral Daily  .  potassium chloride SA  40 mEq Oral Daily  . predniSONE  40 mg Oral Q breakfast  . sertraline  50 mg Oral QHS  . tamsulosin  0.4 mg Oral Daily  . Warfarin - Pharmacist Dosing Inpatient   Does not apply q1600    acetaminophen **OR** acetaminophen, albuterol, LORazepam, nitroGLYCERIN, polyethylene glycol, traMADol   Objective:   Vitals:   12/09/20 0800 12/09/20 0804 12/09/20 1000 12/09/20 1149  BP: (!) 102/51 (!) 102/51    Pulse: (!) 41 (!) 55 (!) 53 (!) 55  Resp: 16 (!) 25 13 13   Temp:    98.6 F (37 C)  TempSrc:    Oral  SpO2: 96% 94% 90% 90%  Weight:      Height:        Intake/Output Summary (Last 24 hours) at 12/09/2020 1153 Last data filed at 12/09/2020 0500 Gross per 24 hour  Intake --  Output 2400 ml  Net -2400 ml   Filed Weights   12/07/20 0425 12/08/20 0500 12/09/20 0500  Weight: 96.8 kg 97.5 kg 97.3 kg     Examination:        Physical Exam:   General:  Alert, oriented, cooperative, in mild distress with shortness of breath, still on 10 L of high flow oxygen via tracheostomy  HEENT:  Normocephalic, PERRL, otherwise with in Normal limits   Neuro:  CNII-XII intact. , normal motor and sensation, reflexes intact   Lungs:    Positive air sounds in all lung fields, minimal rhonchi, Rales and crackles mid to lower lobes  Cardio:    S1/S2, RRR, No murmure, No Rubs or Gallops   Abdomen:   Soft, non-tender, bowel sounds active all four quadrants,  no guarding or peritoneal signs.  Muscular skeletal:  Limited exam - in bed, able to move all 4 extremities, Normal strength,  2+ pulses,  symmetric, + pitting edema  Skin:  Dry, warm to touch, diffuse superficial scaly rash  Wounds: Please see nursing documentation         ------------------------------------------------------------------------------------------------------------------------------------------    LABs:  CBC Latest Ref Rng & Units 12/09/2020 12/08/2020 12/07/2020  WBC 4.0 - 10.5 K/uL 10.9(H) 14.0(H)  12.0(H)  Hemoglobin 12.0 - 15.0 g/dL 13.8 12.8 12.7  Hematocrit 36.0 - 46.0 % 45.3 42.0 41.1  Platelets 150 - 400 K/uL 255 269 224   CMP Latest Ref Rng & Units 12/09/2020 12/08/2020 12/07/2020  Glucose 70 - 99 mg/dL 57(L) 52(L) 227(H)  BUN 6 - 20 mg/dL 42(H) 41(H) 36(H)  Creatinine 0.44 - 1.00 mg/dL 1.45(H) 1.49(H) 1.44(H)  Sodium 135 - 145 mmol/L 140 139 137  Potassium 3.5 - 5.1 mmol/L 3.7 3.1(L) 3.3(L)  Chloride 98 - 111 mmol/L 83(L) 87(L) 87(L)  CO2 22 - 32 mmol/L 44(H) 39(H) 37(H)  Calcium 8.9 - 10.3 mg/dL 8.5(L) 8.4(L) 8.2(L)  Total Protein 6.5 - 8.1 g/dL 6.5 6.3(L) 6.4(L)  Total Bilirubin 0.3 - 1.2 mg/dL 1.2 0.7 0.8  Alkaline Phos 38 - 126 U/L 92 95 102  AST 15 - 41 U/L 15 13(L) 11(L)  ALT 0 - 44 U/L 19 20 19        Micro Results Recent Results (from the past 240 hour(s))  Resp Panel by RT-PCR (Flu A&B, Covid) Nasopharyngeal Swab     Status: None   Collection Time: 12/05/20  5:58 PM   Specimen: Nasopharyngeal Swab; Nasopharyngeal(NP) swabs in vial transport medium  Result Value Ref Range Status   SARS Coronavirus 2 by RT PCR NEGATIVE NEGATIVE Final    Comment: (NOTE) SARS-CoV-2 target nucleic acids are NOT DETECTED.  The SARS-CoV-2 RNA is generally detectable in upper respiratory specimens during the acute phase of infection. The lowest concentration of SARS-CoV-2 viral copies this assay can detect is 138 copies/mL. A negative result does not preclude SARS-Cov-2 infection and should not be used as the sole basis for treatment or other patient management decisions. A negative result may occur with  improper specimen collection/handling, submission of specimen other than nasopharyngeal swab, presence of viral mutation(s) within the areas targeted by this assay, and inadequate number of viral copies(<138 copies/mL). A negative result must be combined with clinical observations, patient history, and epidemiological information. The expected result is Negative.  Fact Sheet  for Patients:  EntrepreneurPulse.com.au  Fact Sheet for Healthcare Providers:  IncredibleEmployment.be  This test is no t yet approved or cleared by the [place] FDA and  has been authorized for detection and/or diagnosis of SARS-CoV-2 by FDA under an Emergency Use Authorization (EUA). This EUA will remain  in effect (meaning this test can be used) for the duration of the COVID-19 declaration under Section 564(b)(1) of the Act, 21 U.S.C.section 360bbb-3(b)(1), unless the authorization is terminated  or revoked sooner.       Influenza A by PCR NEGATIVE NEGATIVE Final   Influenza B by PCR NEGATIVE NEGATIVE Final    Comment: (NOTE) The Xpert Xpress SARS-CoV-2/FLU/RSV plus assay is intended as an aid in the diagnosis of influenza from Nasopharyngeal swab specimens and should not be used as a sole basis for treatment. Nasal washings and aspirates are unacceptable for Xpert Xpress SARS-CoV-2/FLU/RSV testing.  Fact Sheet for Patients: EntrepreneurPulse.com.au  Fact Sheet for Healthcare Providers: IncredibleEmployment.be  This test is not yet approved or cleared by the [place] FDA and has been authorized for detection and/or diagnosis of SARS-CoV-2 by FDA under an Emergency Use Authorization (EUA). This EUA will remain in effect (meaning this test can be used) for the duration of the COVID-19 declaration under Section 564(b)(1) of the Act, 21 U.S.C. section 360bbb-3(b)(1), unless the authorization is terminated or revoked.  Performed at [facility], [address], [place], [place] [address]   MRSA PCR Screening     Status: None   Collection Time: 12/06/20 12:40 AM   Specimen: Nasal Mucosa; Nasopharyngeal  Result Value Ref Range Status   MRSA by PCR NEGATIVE NEGATIVE Final    Comment:        The GeneXpert MRSA Assay (FDA approved for NASAL specimens only), is one component of a comprehensive  MRSA colonization surveillance program. It is not intended to diagnose MRSA infection nor to guide or monitor treatment for MRSA infections. Performed at [facility], [address], [place], [place] [address]     Radiology Reports DG Chest  1 View  Result Date: 12/05/2020 CLINICAL DATA:  55 year old female with shortness of breath. EXAM: CHEST  1 VIEW COMPARISON:  Chest radiograph dated 10/09/2020. FINDINGS: Tracheostomy with tip above the carina. There is cardiomegaly with vascular congestion. Pneumonia is not excluded. Overall improved congestion compared to the prior radiograph. No focal consolidation or pneumothorax. No large pleural effusion. Median sternotomy wires. No acute osseous pathology. IMPRESSION: Cardiomegaly with vascular congestion. Electronically Signed   By: Anner Crete M.D.   On: 12/05/2020 17:32   DG CHEST PORT 1 VIEW  Result Date: 12/07/2020 CLINICAL DATA:  Tracheostomy, dyspnea, CHF EXAM: PORTABLE CHEST 1 VIEW COMPARISON:  Chest radiograph from one day prior. FINDINGS: Tracheostomy tube tip overlies the tracheal air column at the thoracic inlet. Intact sternotomy wires. Stable cardiomediastinal silhouette with moderate cardiomegaly. No pneumothorax. Probable trace bilateral pleural effusions. Suture line at the right costophrenic angle again noted. Mild pulmonary edema. No acute consolidative airspace disease. IMPRESSION: 1. Mild congestive heart failure. 2. Probable trace bilateral pleural effusions. Electronically Signed   By: Theresa [name] M.D.   On: 12/07/2020 10:15   DG CHEST PORT 1 VIEW  Result Date: 12/06/2020 CLINICAL DATA:  Shortness of breath. EXAM: PORTABLE CHEST 1 VIEW COMPARISON:  12/05/2020 FINDINGS: Tracheostomy tube appears in adequate position. Sternotomy wires unchanged. Lordotic technique is demonstrated as lungs are adequately inflated without lobar consolidation or effusion. Surgical suture line over the right base unchanged. Mild stable prominence of  the pulmonary vasculature which may indicate a mild degree of vascular congestion. Stable cardiomegaly. Remainder of the exam is unchanged. IMPRESSION: Stable cardiomegaly with suggestion of mild vascular congestion. Electronically Signed   By: Marin Olp M.D.   On: 12/06/2020 08:19   ECHOCARDIOGRAM LIMITED  Result Date: 12/06/2020    ECHOCARDIOGRAM LIMITED REPORT   Patient Name:   NAVADA OSTERHOUT Date of Exam: 12/06/2020 Medical Rec #:  [id number]       Height:       58.0 in Accession #:    [id number]      Weight:       221.3 lb Date of Birth:  [birth year]       BSA:          1.901 m Patient Age:    6 years        BP:           125/47 mmHg Patient Gender: F               HR:           70 bpm. Exam Location:  [facility] Procedure: Limited Echo Indications:    Abnormal ECG R94.31  History:        Patient has prior history of Echocardiogram examinations, most                 recent 07/02/2020. CAD, COPD, Signs/Symptoms:Dyspnea; Risk                 Factors:Diabetes, Hypertension, Dyslipidemia and Current Smoker.  Sonographer:    Leavy Cella RDCS (AE) Referring Phys: [id number] [address] EL[address] IMPRESSIONS  1. Left ventricular ejection fraction, by estimation, is 45 to 50%. The left ventricle has mildly decreased function. The left ventricle demonstrates global hypokinesis. There is mild left ventricular hypertrophy. Left ventricular diastolic parameters were normal.  2. Right ventricular systolic function is normal. The right ventricular size is normal. There is moderately elevated pulmonary artery systolic pressure. The estimated right ventricular systolic pressure is 00.8 mmHg.  3. The mitral valve is normal in structure. No evidence of mitral valve regurgitation. No evidence of mitral stenosis.  4. The aortic valve is normal in structure. Aortic valve regurgitation is not visualized. No aortic stenosis is present.  5. The inferior vena cava is normal in size with greater than 50% respiratory variability,  suggesting right atrial pressure of 3 mmHg. Comparison(s): No significant change from prior study. Prior images reviewed side by side. FINDINGS  Left Ventricle: Left ventricular ejection fraction, by estimation, is 45 to 50%. The left ventricle has mildly decreased function. The left ventricle demonstrates global hypokinesis. Definity contrast agent was given IV to delineate the left ventricular  endocardial borders. The left ventricular internal cavity size was normal in size. There is mild left ventricular hypertrophy. Left ventricular diastolic parameters were normal.  LV Wall Scoring: The mid inferoseptal segment and apical septal segment are akinetic. Right Ventricle: The right ventricular size is normal. No increase in right ventricular wall thickness. Right ventricular systolic function is normal. There is moderately elevated pulmonary artery systolic pressure. The tricuspid regurgitant velocity is 3.50 m/s, and with an assumed right atrial pressure of 3 mmHg, the estimated right ventricular systolic pressure is 02.5 mmHg. Left Atrium: Left atrial size was normal in size. Right Atrium: Right atrial size was normal in size. Pericardium: There is no evidence of pericardial effusion. Mitral Valve: The mitral valve is normal in structure. No evidence of mitral valve stenosis. Tricuspid Valve: The tricuspid valve is normal in structure. Tricuspid valve regurgitation is mild . No evidence of tricuspid stenosis. Aortic Valve: The aortic valve is normal in structure. Aortic valve regurgitation is not visualized. No aortic stenosis is present. Pulmonic Valve: The pulmonic valve was normal in structure. Pulmonic valve regurgitation is mild. No evidence of pulmonic stenosis. Aorta: The aortic root is normal in size and structure. Venous: The inferior vena cava is normal in size with greater than 50% respiratory variability, suggesting right atrial pressure of 3 mmHg. IAS/Shunts: No atrial level shunt detected by color  flow Doppler. LEFT VENTRICLE PLAX 2D LVIDd:         4.19 cm  Diastology LVIDs:         3.10 cm  LV e' medial:    3.98 cm/s LV PW:         1.52 cm  LV E/e' medial:  16.2 LV IVS:        1.29 cm  LV e' lateral:   7.20 cm/s LVOT diam:     1.80 cm  LV E/e' lateral: 8.9 LVOT Area:     2.54 cm  LEFT ATRIUM         Index LA diam:    4.30 cm 2.26 cm/m   AORTA Ao Root diam: 3.00 cm MITRAL VALVE               TRICUSPID VALVE MV Area (PHT): 2.91 cm    TR Peak grad:   49.0 mmHg MV Decel Time: 261 msec    TR Vmax:        350.00 cm/s MV E velocity: 64.30 cm/s MV A velocity: 47.10 cm/s  SHUNTS MV E/A ratio:  1.37        Systemic Diam: 1.80 cm Candee Furbish MD Electronically signed by Candee Furbish MD Signature Date/Time: 12/06/2020/1:27:33 PM    Final     SIGNED: Deatra James, MD, FHM. [facility],  Pager (please use [email or web address] to page/text) Please use Epic Secure Chat for non-urgent communication (7AM-7PM)  If 7PM-7AM, please contact night-coverage [email or web address], 12/09/2020, 11:53 AM

## 2020-12-09 NOTE — Progress Notes (Signed)
Inpatient Diabetes Program Recommendations  AACE/ADA: New Consensus Statement on Inpatient Glycemic Control (2015)  Target Ranges:  Prepandial:   less than 140 mg/dL      Peak postprandial:   less than 180 mg/dL (1-2 hours)      Critically ill patients:  140 - 180 mg/dL   Lab Results  Component Value Date   GLUCAP 68 (L) 12/09/2020   HGBA1C 9.1 (H) 12/05/2020    Review of Glycemic Control Results for Theresa [name], Theresa [name] (MRN [id number]) as of 12/09/2020 09:35  Ref. Range 12/08/2020 07:39 12/08/2020 11:26 12/08/2020 16:33 12/08/2020 21:28 12/09/2020 07:34  Glucose-Capillary Latest Ref Range: 70 - 99 mg/dL 56 (L) 149 (H) 184 (H) 222 (H) 68 (L)  Diabetes history: DM 2 Outpatient Diabetes medications: Tresiba 40 units q HS, Novolog 1-10 units tid with meals Current orders for Inpatient glycemic control:  Prednisone 40 mg daily Lantus 20 units bid Novolog sensitive tid with meals and HS Inpatient Diabetes Program Recommendations:   Please reduce Lantus to 16 units bid.  Thanks,  Adah Perl, RN, BC-ADM Inpatient Diabetes Coordinator Pager [phone] (8a-5p)

## 2020-12-09 NOTE — Evaluation (Signed)
Physical Therapy Evaluation Patient Details Name: Theresa [name] MRN: [id number] DOB: 05-29-1966 Today's Date: 12/09/2020   History of Present Illness  Theresa [name]  is a 55 y.o. female, with systolic and diastolic CHF, COPD, continued tobacco abuse, diabetes mellitus type 2, lupus anticoagulant syndrome, coronary artery disease with history of STEMI, hypertension, chronic respiratory failure on 4 L, chronic tracheostomy (since 2002).  -Patient presents today secondary to complaints of shortness of breath, over the last week, she reports worsening dyspnea, leg swelling, per EMS she was 70% on her baseline 4 L nasal cannula, in ED she was 82% on 4 L, she is currently requiring ATC at 28% (10 L), denies fever, chills, chest pain, abdominal pain, no dysuria or polyuria, reports she has been taking torsemide as instructed, reports she has been compliant with her low-salt diet as well, .  -In ED her chest x-ray significant cardiomegaly with vascular congestion, her BNP elevated at 195, glucose elevated at 314, she was wheezing, dyspnea has improved with IV Lasix and IV Solu-Medrol, [facility] hospitalist consulted to admit.    Clinical Impression  Patient functioning near baseline for functional mobility and gait with good return for bed mobility, transfers and taking steps at bedside.  Patient able to ambulate up to 20 feet at bedside before fatiguing, on 5 LPM with SpO2 between 84-88%, had to increase to 7 LPM while seated in chair to maintain between 86-90% - RN notified.  Patient tolerated sitting up in chair with her spouse present in room after therapy.  Patient will benefit from continued physical therapy in hospital and recommended venue below to increase strength, balance, endurance for safe ADLs and gait.      Follow Up Recommendations Home health PT;Supervision for mobility/OOB;Supervision - Intermittent    Equipment Recommendations  None recommended by PT    Recommendations for Other Services        Precautions / Restrictions Precautions Precautions: Fall Restrictions Weight Bearing Restrictions: No      Mobility  Bed Mobility Overal bed mobility: Modified Independent                  Transfers Overall transfer level: Needs assistance Equipment used: Rolling walker (2 wheeled) Transfers: Sit to/from [facility] Sit to Stand: Min guard Stand pivot transfers: Min guard       General transfer comment: slightly unsteady movement, increased time  Ambulation/Gait Ambulation/Gait assistance: Min guard Gait Distance (Feet): 20 Feet Assistive device: Rolling walker (2 wheeled) Gait Pattern/deviations: Decreased step length - right;Decreased step length - left;Decreased stride length Gait velocity: decreased   General Gait Details: slightly labored cadence without loss of balance while on 5 LPM O2 with SpO2 at 84-88%  Stairs            Wheelchair Mobility    Modified Rankin (Stroke Patients Only)       Balance Overall balance assessment: Needs assistance Sitting-balance support: Feet supported;No upper extremity supported Sitting balance-Leahy Scale: Good Sitting balance - Comments: seated at EOB   Standing balance support: During functional activity;No upper extremity supported Standing balance-Leahy Scale: Fair Standing balance comment: fair/good using RW                             Pertinent Vitals/Pain Pain Assessment: No/denies pain    Home Living Family/patient expects to be discharged to:: Private residence Living Arrangements: Spouse/significant other Available Help at Discharge: Family;Available 24 hours/day Type of Home: Homestead  Access: Stairs to enter Entrance Stairs-Rails: None Entrance Stairs-Number of Steps: 2-3 Home Layout: One level Home Equipment: Walker - 2 wheels;Cane - single point;Bedside commode;Wheelchair - manual;Tub bench;Shower seat      Prior Function Level of Independence: Needs  assistance   Gait / Transfers Assistance Needed: Household ambulator leaning on walls and nearby objects or uses RW  ADL's / Homemaking Assistance Needed: assisted by family        Hand Dominance        Extremity/Trunk Assessment   Upper Extremity Assessment Upper Extremity Assessment: Overall WFL for tasks assessed    Lower Extremity Assessment Lower Extremity Assessment: Generalized weakness    Cervical / Trunk Assessment Cervical / Trunk Assessment: Normal  Communication   Communication: No difficulties  Cognition Arousal/Alertness: Awake/alert Behavior During Therapy: WFL for tasks assessed/performed Overall Cognitive Status: Within Functional Limits for tasks assessed                                        General Comments      Exercises     Assessment/Plan    PT Assessment Patient needs continued PT services  PT Problem List Decreased strength;Decreased activity tolerance;Decreased balance;Decreased mobility       PT Treatment Interventions DME instruction;Gait training;Stair training;Functional mobility training;Therapeutic activities;Therapeutic exercise;Balance training;Patient/family education    PT Goals (Current goals can be found in the Care Plan section)  Acute Rehab PT Goals Patient Stated Goal: return home with family to assist PT Goal Formulation: With patient/family Time For Goal Achievement: 12/16/20 Potential to Achieve Goals: Good    Frequency Min 3X/week   Barriers to discharge        Co-evaluation               AM-PAC PT "6 Clicks" Mobility  Outcome Measure Help needed turning from your back to your side while in a flat bed without using bedrails?: None Help needed moving from lying on your back to sitting on the side of a flat bed without using bedrails?: A Little Help needed moving to and from a bed to a chair (including a wheelchair)?: A Little Help needed standing up from a chair using your arms  (e.g., wheelchair or bedside chair)?: A Little Help needed to walk in hospital room?: A Little Help needed climbing 3-5 steps with a railing? : A Lot 6 Click Score: 18    End of Session Equipment Utilized During Treatment: Oxygen Activity Tolerance: Patient tolerated treatment well;Patient limited by fatigue Patient left: in chair;with call bell/phone within reach;with family/visitor present Nurse Communication: Mobility status PT Visit Diagnosis: Unsteadiness on feet (R26.81);Other abnormalities of gait and mobility (R26.89);Muscle weakness (generalized) (M62.81)    Time: 1660-6301 PT Time Calculation (min) (ACUTE ONLY): 25 min   Charges:   PT Evaluation $PT Eval Moderate Complexity: 1 Mod PT Treatments $Therapeutic Activity: 23-37 mins        11:09 AM, 12/09/20 Lonell Grandchild, MPT Physical Therapist with [facility] [phone] office [phone] mobile phone

## 2020-12-09 NOTE — Consult Note (Signed)
NAME:  Theresa [name], MRN:  [id number], DOB:  [birth year], LOS: 4 ADMISSION DATE:  12/05/2020, CONSULTATION DATE:  4/5 REFERRING MD:  Shahmehdi   CHIEF COMPLAINT:  Resp failure acute on chronic   Brief patient profile:  46 yowf  MO/ smoker  trach dep since 2002 p ?inhalational injury and says has  R>L VC paresis care per [place] ent = [facility] and admitted with recurrent resp failure/ anasarca and asked PCCM asked to see 4/5     History of Present Illness:  55 y.o. female, with systolic and diastolic CHF, COPD, continued tobacco abuse, diabetes mellitus type 2, lupus anticoagulant syndrome, coronary artery disease with history of STEMI, hypertension, chronic respiratory failure on 4 L, chronic tracheostomy (since 2002). -Patient presents today secondary to complaints of shortness of breath, over the last week, she reports worsening dyspnea, leg swelling, per EMS she was 70% on her baseline 4 L nasal cannula, in ED she was 82% on 4 L, she is currently requiring ATC at 28% (10 L), denies fever, chills, chest pain, abdominal pain, no dysuria or polyuria, reports she has been taking torsemide as instructed, reports she has been compliant with her low-salt diet as well, . -In ED her chest x-ray significant cardiomegaly with vascular congestion, her BNP elevated at 195, glucose elevated at 314, she was wheezing, dyspnea has improved with IV Lasix and IV Solu-Medrol, [facility] hospitalist consulted to admit.2  Pertinent  Medical History  Acute kidney failure with lesion of tubular necrosis Acute systolic heart failure CAD Cervical cancer Chronic diastolic CHF (congestive heart failure)  Chronic respiratory failure ([place]),  COPD   Diabetes mellitus DVT (deep venous thrombosis)  Lupus anticoagulant disorder  Endometriosis History of gallstones  HTN  Hyperlipidemia Morbid obesity Tracheostomy in place chronic since 2002 (11/03/2015).    Significant Hospital Events: Including procedures, antibiotic start  and stop dates in addition to other pertinent events   . Echo 4/2 1. Left ventricular ejection fraction, by estimation, is 45 to 50%. The  left ventricle has mildly decreased function. The left ventricle  demonstrates global hypokinesis. There is mild left ventricular  hypertrophy. Left ventricular diastolic parameters were normal.  2. Right ventricular systolic function is normal. The right ventricular  size is normal. There is moderately elevated pulmonary artery systolic  pressure. The estimated right ventricular systolic pressure is 38.2 mmHg.  3. The mitral valve is normal in structure. No evidence of mitral valve  regurgitation. No evidence of mitral stenosis.  4. The aortic valve is normal in structure. Aortic valve regurgitation is  not visualized. No aortic stenosis is present.  5. The inferior vena cava is normal in size with greater than 50%  respiratory variability, suggesting right atrial pressure of 3 mmHg.  Marland Kitchen Resp culture (trach) 4/5 >>>    Scheduled Meds: . acidophilus  1 capsule Oral TID  . atorvastatin  20 mg Oral Daily  . azithromycin  500 mg Oral Daily  . Chlorhexidine Gluconate Cloth  6 each Topical Daily  . digoxin  125 mcg Oral Daily  . furosemide  80 mg Intravenous BID  . gabapentin  600 mg Oral QHS  . guaiFENesin-dextromethorphan  10 mL Oral Q8H  . insulin aspart  0-9 Units Subcutaneous TID WC  . insulin glargine  16 Units Subcutaneous BID  . ipratropium-albuterol  3 mL Nebulization TID  . levothyroxine  50 mcg Oral Q0600  . metoprolol succinate  25 mg Oral Daily  . mometasone  1 application Topical Daily  .  pantoprazole  40 mg Oral Daily  . potassium chloride SA  40 mEq Oral Daily  . predniSONE  40 mg Oral Q breakfast  . sertraline  50 mg Oral QHS  . spironolactone  25 mg Oral BID  . tamsulosin  0.4 mg Oral Daily  . warfarin  2.5 mg Oral ONCE-1600  . Warfarin - Pharmacist Dosing Inpatient   Does not apply q1600   Continuous Infusions: PRN  Meds:.acetaminophen **OR** acetaminophen, albuterol, LORazepam, nitroGLYCERIN, polyethylene glycol, traMADol     Interim History / Subjective:  Says able to ambulate with nasal 02 at home leaving trach open on 4lpm but "just around the house" - never goes out shopping or walking to mb   Objective   Blood pressure (!) 102/51, pulse (!) 53, temperature 98.5 F (36.9 C), temperature source Oral, resp. rate 13, height 4\' 10"  (1.473 m), weight 97.3 kg, SpO2 90 %.    FiO2 (%):  [40 %] 40 %   Intake/Output Summary (Last 24 hours) at 12/09/2020 1107 Last data filed at 12/09/2020 0500 Gross per 24 hour  Intake --  Output 2400 ml  Net -2400 ml   Filed Weights   12/07/20 0425 12/08/20 0500 12/09/20 0500  Weight: 96.8 kg 97.5 kg 97.3 kg    Examination: Tmax  98.5  GeneralL obese wf alert/ nad  HENT: minimally hoarse/ speaks by placing finger over trach, does usually use plug much at all  Lungs: disant bs bilaterally  Cardiovascular: RRR no s3  Abdomen: obese/ limited excurseion Extremities: trace pitting bilaterally  Neuro: intact     I personally reviewed images and agree with radiology impression as follows:  CXR:   Portable 4/3 1. Mild congestive heart failure. 2. Probable trace bilateral pleural effusions        Assessment & Plan:    1) chronic hypoxemic and hypercarbic resp failure secondary to OHS/ VC paresis  with unusual set up where she can't breathe comfortably with trach plugged  But only uses nasal 02 so this leave trach open 24/7 and subject to mucus plugs  >>> key is to assure adequate 02 sats 24/7 with same set up she will be using at home and I prefer trach 02/ humdity for this purpose hs and titrate daytime if wants to not use the speaking valve but leave open and self plug > will discuss with Dr Constance Holster   2) Biventricular chf with tendency to anasarca refractpry to demadex/zaroxylyn ? Adherence?  >>> add aldactone 25 mg bid to offset physiologic hyperadrenalism  from diuretic rx.   3) Morbid obesity  Lab Results  Component Value Date   TSH 3.750 08/01/2020   >>> repeat TSH due      Labs   CBC: Recent Labs  Lab 12/05/20 1714 12/06/20 0451 12/07/20 0514 12/08/20 0313 12/09/20 0628  WBC 8.0 6.0 12.0* 14.0* 10.9*  NEUTROABS 6.0  --   --   --   --   HGB 13.4 12.8 12.7 12.8 13.8  HCT 43.8 41.5 41.1 42.0 45.3  MCV 95.6 95.4 96.3 95.7 97.0  PLT 220 203 224 269 329    Basic Metabolic Panel: Recent Labs  Lab 12/05/20 1714 12/06/20 0451 12/07/20 0514 12/08/20 0313 12/09/20 0628  NA 132* 135 137 139 140  K 4.3 4.3 3.3* 3.1* 3.7  CL 91* 88* 87* 87* 83*  CO2 30 33* 37* 39* 44*  GLUCOSE 314* 348* 227* 52* 57*  BUN 34* 36* 36* 41* 42*  CREATININE 1.56* 1.48*  1.44* 1.49* 1.45*  CALCIUM 8.3* 8.3* 8.2* 8.4* 8.5*   GFR: Estimated Creatinine Clearance: 43.9 mL/min (A) (by C-G formula based on SCr of 1.45 mg/dL (H)). Recent Labs  Lab 12/06/20 0451 12/07/20 0514 12/07/20 0736 12/07/20 1029 12/08/20 0313 12/09/20 0628  WBC 6.0 12.0*  --   --  14.0* 10.9*  LATICACIDVEN  --   --  2.2* 4.0*  --   --     Liver Function Tests: Recent Labs  Lab 12/05/20 1714 12/06/20 0451 12/07/20 0514 12/08/20 0313 12/09/20 0628  AST 14* 11* 11* 13* 15  ALT 23 21 19 20 19   ALKPHOS 133* 122 102 95 92  BILITOT 0.8 0.9 0.8 0.7 1.2  PROT 6.9 6.4* 6.4* 6.3* 6.5  ALBUMIN 2.8* 2.6* 2.6* 2.7* 2.9*   Recent Labs  Lab 12/05/20 1714  LIPASE 27   No results for input(s): AMMONIA in the last 168 hours.  ABG    Component Value Date/Time   PHART 7.454 (H) 12/09/2020 0845   PCO2ART 66.6 (HH) 12/09/2020 0845   PO2ART 66.3 (L) 12/09/2020 0845   HCO3 42.5 (H) 12/09/2020 0845   TCO2 30 10/16/2018 1408   ACIDBASEDEF 2.0 10/15/2018 1111   O2SAT 92.1 12/09/2020 0845     Coagulation Profile: Recent Labs  Lab 12/05/20 1714 12/06/20 0451 12/07/20 0514 12/08/20 0313 12/09/20 0628  INR 2.8* 2.6* 3.1* 3.2* 1.8*    Cardiac Enzymes: No results for  input(s): CKTOTAL, CKMB, CKMBINDEX, TROPONINI in the last 168 hours.  HbA1C: Hgb A1c MFr Bld  Date/Time Value Ref Range Status  12/05/2020 06:56 PM 9.1 (H) 4.8 - 5.6 % Final    Comment:    (NOTE) Pre diabetes:          5.7%-6.4%  Diabetes:              >6.4%  Glycemic control for   <7.0% adults with diabetes   08/01/2020 12:51 PM 8.2 (H) 4.8 - 5.6 % Final    Comment:    (NOTE) Pre diabetes:          5.7%-6.4%  Diabetes:              >6.4%  Glycemic control for   <7.0% adults with diabetes     CBG: Recent Labs  Lab 12/08/20 0739 12/08/20 1126 12/08/20 1633 12/08/20 2128 12/09/20 0734  GLUCAP 56* 149* 184* 222* 68*       Past Medical History:  She,  has a past medical history of Acute kidney failure with lesion of tubular necrosis (HCC), Acute systolic heart failure ([place]), CAD (coronary artery disease), Cardiac arrest ([place]), Cervical cancer (HCC), Chronic diastolic CHF (congestive heart failure) ([place]), Chronic respiratory failure ([place]), Chronic RUQ pain, COPD (chronic obstructive pulmonary disease) ([place]), Diabetes mellitus ([place]), DVT (deep venous thrombosis) ([place]), Endometriosis, History of gallstones (01/2016), History of HIDA scan (11/2016), HTN (hypertension), Hyperlipidemia, Lupus anticoagulant disorder ([place]), Morbid obesity ([place]), Psoriasis, ST elevation (STEMI) myocardial infarction involving right coronary artery ([place]) (10/29/15), Toe fracture, right (03/29/2018), and Tracheostomy in place [facility]), chronic since 2002 (11/03/2015).   Surgical History:   Past Surgical History:  Procedure Laterality Date  . CARDIAC CATHETERIZATION N/A 10/29/2015   Procedure: Left Heart Cath and Cors/Grafts Angiography;  Surgeon: Burnell Blanks, MD;  Location: [place] [facility];  Service: Cardiovascular;  Laterality: N/A;  . CARDIAC CATHETERIZATION  10/29/2015   Procedure: Coronary Stent Intervention;  Surgeon: Burnell Blanks, MD;  Location: [place] [facility];  Service:  Cardiovascular;;  .  CAROTID STENT    . CESAREAN SECTION WITH BILATERAL TUBAL LIGATION    . CORONARY ARTERY BYPASS GRAFT  2007   2V  . IR GASTROSTOMY TUBE MOD SED  11/13/2018  . RIGHT/LEFT HEART CATH AND CORONARY/GRAFT ANGIOGRAPHY N/A 03/24/2018   Procedure: RIGHT/LEFT HEART CATH AND CORONARY/GRAFT ANGIOGRAPHY;  Surgeon: Belva Crome, MD;  Location: [place] [facility];  Service: Cardiovascular;  Laterality: N/A;  . TRACHEOSTOMY       Social History:   reports that she has been smoking cigarettes. She started smoking about 42 years ago. She has been smoking about 0.75 packs per day. She has never used smokeless tobacco. She reports that she does not drink alcohol and does not use drugs.   Family History:  Her family history includes Diabetes in her father and mother; Hypertension in her father and mother. There is no history of Colon cancer or Colon polyps.   Allergies Allergies  Allergen Reactions  . Penicillins Rash    Has patient had a PCN reaction causing immediate rash, facial/tongue/throat swelling, SOB or lightheadedness with hypotension: Yes Has patient had a PCN reaction causing severe rash involving mucus membranes or skin necrosis: No Has patient had a PCN reaction that required hospitalization No Has patient had a PCN reaction occurring within the last 10 years: No If all of the above answers are "NO", then may proceed with Cephalosporin use.   REACTION: rash Pt has tolerated cefepime in the past.  . Ciprofloxacin     nausea  . Ibuprofen Rash    swelling in leg      Home Medications  Prior to Admission medications   Medication Sig Start Date End Date Taking? Authorizing Provider  albuterol (PROVENTIL) (2.5 MG/3ML) 0.083% nebulizer solution Take 3 mLs (2.5 mg total) by nebulization every 4 (four) hours as needed for wheezing. 07/07/20  Yes Johnson, Clanford L, MD  atorvastatin (LIPITOR) 20 MG tablet Take 20 mg by mouth daily. 07/27/20  Yes [provider]   diazepam (VALIUM) 5 MG tablet Take 5 mg by mouth daily as needed.   Yes [provider]  digoxin (LANOXIN) 0.125 MG tablet Take 1 tablet (125 mcg total) by mouth daily. Started 10/31/2020 11/11/20  Yes Verta Ellen., NP  gabapentin (NEURONTIN) 300 MG capsule Take 3 capsules (900 mg total) by mouth at bedtime. 07/07/20  Yes Johnson, Clanford L, MD  insulin degludec (TRESIBA FLEXTOUCH) 100 UNIT/ML FlexTouch Pen Inject 30 Units into the skin at bedtime. Patient taking differently: Inject 40 Units into the skin at bedtime. 07/07/20  Yes Johnson, Clanford L, MD  levothyroxine (SYNTHROID) 50 MCG tablet Take 1 tablet by mouth daily. 10/09/20 11/11/21 Yes [provider]  LORazepam (ATIVAN) 0.5 MG tablet Take 0.5 mg by mouth as needed for anxiety.   Yes [provider]  metolazone (ZAROXOLYN) 5 MG tablet Take 5 mg by mouth. Tuesday & Wednesday   Yes [provider]  metoprolol succinate (TOPROL-XL) 25 MG 24 hr tablet Take 25 mg by mouth daily. 10/09/20  Yes [provider]  mometasone (ELOCON) 0.1 % ointment Apply 1 application topically daily as needed. 06/24/20  Yes [provider]  nitroGLYCERIN (NITROSTAT) 0.4 MG SL tablet Place 0.4 mg under the tongue every 5 (five) minutes as needed for chest pain.   Yes [provider]  NOVOLOG FLEXPEN 100 UNIT/ML FlexPen Inject 1-10 Units into the skin 3 (three) times daily before meals. 04/06/20  Yes [provider]  pantoprazole ([place])  20 MG tablet Take 20 mg by mouth daily. 02/16/20  Yes [provider]  polyethylene glycol (MIRALAX / GLYCOLAX) 17 g packet Take 17 g by mouth daily as needed for mild constipation. 06/20/20  Yes Kathie Dike, MD  potassium chloride SA (KLOR-CON) 20 MEQ tablet Take 2 tablets (40 mEq total) by mouth daily. 11/11/20 12/11/20 Yes Verta Ellen., NP  sertraline (ZOLOFT) 50 MG tablet Take 50 mg by mouth at bedtime.    Yes [provider]   tamsulosin (FLOMAX) 0.4 MG CAPS capsule Take 0.4 mg by mouth daily.   Yes [provider]  torsemide (DEMADEX) 20 MG tablet Take 20 mg by mouth daily. 3 tablets in the morning and 3 tablets at bedtime.   Yes [provider]  traMADol (ULTRAM) 50 MG tablet Take 1 tablet (50 mg total) by mouth 3 (three) times daily as needed for moderate pain. 08/07/20  Yes Dessa Phi, DO  warfarin (COUMADIN) 2.5 MG tablet Take 0.5-1 tablets (1.25-2.5 mg total) by mouth See admin instructions. 2.5mg  MWF and 1.25mg  every other day. Patient taking differently: Take 1.125-2.5 mg by mouth See admin instructions. 5mg  on Monday & Friday and 2.5mg  all other days - MANAGED BY PCP 08/07/20  Yes Dessa Phi, DO  albuterol (VENTOLIN HFA) 108 (90 Base) MCG/ACT inhaler Inhale 2 puffs into the lungs every 4 (four) hours as needed for wheezing or shortness of breath. Patient not taking: Reported on 12/05/2020    [provider]    The patient is critically ill with multiple organ systems failure and requires high complexity decision making for assessment and support, frequent evaluation and titration of therapies, application of advanced monitoring technologies and extensive interpretation of multiple databases. Critical Care Time devoted to patient care services described in this note is 40 minutes.    Christinia Gully, MD Pulmonary and [place] [phone]   After 7:00 pm call Elink  [phone]

## 2020-12-09 NOTE — Plan of Care (Signed)
  Problem: Acute Rehab PT Goals(only PT should resolve) Goal: Pt Will Go Supine/Side To Sit Outcome: Progressing Flowsheets (Taken 12/09/2020 1111) Pt will go Supine/Side to Sit:  with modified independence  Independently Goal: Patient Will Transfer Sit To/From Stand Outcome: Progressing Flowsheets (Taken 12/09/2020 1111) Patient will transfer sit to/from stand:  with modified independence  with supervision Goal: Pt Will Transfer Bed To Chair/Chair To Bed Outcome: Progressing Flowsheets (Taken 12/09/2020 1111) Pt will Transfer Bed to Chair/Chair to Bed:  with modified independence  with supervision Goal: Pt Will Ambulate Outcome: Progressing Flowsheets (Taken 12/09/2020 1111) Pt will Ambulate:  50 feet  with supervision  with modified independence  with rolling walker   11:12 AM, 12/09/20 Lonell Grandchild, MPT Physical Therapist with [facility] [phone] office [phone] mobile phone

## 2020-12-10 DIAGNOSIS — I4892 Unspecified atrial flutter: Secondary | ICD-10-CM | POA: Diagnosis not present

## 2020-12-10 DIAGNOSIS — Z930 Tracheostomy status: Secondary | ICD-10-CM | POA: Diagnosis not present

## 2020-12-10 DIAGNOSIS — J9621 Acute and chronic respiratory failure with hypoxia: Secondary | ICD-10-CM | POA: Diagnosis not present

## 2020-12-10 DIAGNOSIS — I5043 Acute on chronic combined systolic (congestive) and diastolic (congestive) heart failure: Secondary | ICD-10-CM | POA: Diagnosis not present

## 2020-12-10 DIAGNOSIS — I272 Pulmonary hypertension, unspecified: Secondary | ICD-10-CM | POA: Diagnosis not present

## 2020-12-10 LAB — BASIC METABOLIC PANEL
Anion gap: 16 — ABNORMAL HIGH (ref 5–15)
BUN: 42 mg/dL — ABNORMAL HIGH (ref 6–20)
CO2: 43 mmol/L — ABNORMAL HIGH (ref 22–32)
Calcium: 8.4 mg/dL — ABNORMAL LOW (ref 8.9–10.3)
Chloride: 81 mmol/L — ABNORMAL LOW (ref 98–111)
Creatinine, Ser: 1.3 mg/dL — ABNORMAL HIGH (ref 0.44–1.00)
GFR, Estimated: 49 mL/min — ABNORMAL LOW (ref >60)
Glucose, Bld: 76 mg/dL (ref 70–99)
Potassium: 3.3 mmol/L — ABNORMAL LOW (ref 3.5–5.1)
Sodium: 140 mmol/L (ref 135–145)

## 2020-12-10 LAB — CBC
HCT: 43.1 % (ref 36.0–46.0)
Hemoglobin: 13.2 g/dL (ref 12.0–15.0)
MCH: 29.2 pg (ref 26.0–34.0)
MCHC: 30.6 g/dL (ref 30.0–36.0)
MCV: 95.4 fL (ref 80.0–100.0)
Platelets: 230 10*3/uL (ref 150–400)
RBC: 4.52 MIL/uL (ref 3.87–5.11)
RDW: 15.9 % — ABNORMAL HIGH (ref 11.5–15.5)
WBC: 8.7 10*3/uL (ref 4.0–10.5)
nRBC: 0 % (ref 0.0–0.2)

## 2020-12-10 LAB — PROTIME-INR
INR: 1.6 — ABNORMAL HIGH (ref 0.8–1.2)
Prothrombin Time: 18.2 seconds — ABNORMAL HIGH (ref 11.4–15.2)

## 2020-12-10 LAB — GLUCOSE, CAPILLARY
Glucose-Capillary: 77 mg/dL (ref 70–99)
Glucose-Capillary: 290 mg/dL — ABNORMAL HIGH (ref 70–99)
Glucose-Capillary: 203 mg/dL — ABNORMAL HIGH (ref 70–99)
Glucose-Capillary: 254 mg/dL — ABNORMAL HIGH (ref 70–99)

## 2020-12-10 LAB — TSH: TSH: 7.254 u[IU]/mL — ABNORMAL HIGH (ref 0.350–4.500)

## 2020-12-10 MED ORDER — WARFARIN SODIUM 2 MG PO TABS
4.0000 mg | ORAL_TABLET | Freq: Once | ORAL | Status: AC
  Administered 2020-12-10: 4 mg via ORAL
  Filled 2020-12-10: qty 2

## 2020-12-10 MED ORDER — POTASSIUM CHLORIDE CRYS ER 20 MEQ PO TBCR
40.0000 meq | EXTENDED_RELEASE_TABLET | Freq: Once | ORAL | Status: AC
  Administered 2020-12-10: 40 meq via ORAL
  Filled 2020-12-10: qty 2

## 2020-12-10 MED ORDER — LEVOTHYROXINE SODIUM 100 MCG PO TABS
100.0000 ug | ORAL_TABLET | Freq: Every day | ORAL | Status: DC
  Administered 2020-12-11 – 2020-12-12 (×2): 100 ug via ORAL
  Filled 2020-12-10 (×2): qty 1

## 2020-12-10 NOTE — Progress Notes (Signed)
NAME:  Theresa [name], MRN:  [id number], DOB:  Mar 13, 1966, LOS: 5 ADMISSION DATE:  12/05/2020, CONSULTATION DATE:  4/5 REFERRING MD:  Shahmehdi   CHIEF COMPLAINT:  Resp failure acute on chronic   Brief patient profile:  55 yowf  MO/ smoker  Trach/02dep since 2002 p ? 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 female, with systolic and diastolic CHF, COPD, continued tobacco abuse, diabetes mellitus type 2, lupus anticoagulant syndrome, coronary artery disease with history of STEMI, hypertension, chronic respiratory failure on 4 L, chronic tracheostomy (since 2002). -Patient presents today secondary to complaints of shortness of breath, over the last week, she reports worsening dyspnea, leg swelling, per EMS she was 55% on her baseline 4 L nasal cannula, in ED she was 55% on 4 L, she is currently requiring ATC at 28% (10 L), denies fever, chills, chest pain, abdominal pain, no dysuria or polyuria, reports she has been taking torsemide as instructed, reports she has been compliant with her low-salt diet as well, . -In ED her chest x-ray significant cardiomegaly with vascular congestion, her BNP elevated at 195, glucose elevated at 314, she was wheezing, dyspnea has improved with IV Lasix and IV Solu-Medrol, [facility] hospitalist consulted to admit.2  Pertinent  Medical History  Acute kidney failure with lesion of tubular necrosis Acute systolic heart failure CAD Cervical cancer Chronic diastolic CHF (congestive heart failure)  Chronic respiratory failure ([name]),  COPD   Diabetes mellitus DVT (deep venous thrombosis)  Lupus anticoagulant disorder  Endometriosis History of gallstones  HTN  Hyperlipidemia Morbid obesity Tracheostomy in place chronic since 2002 (11/03/2015).    Significant Hospital Events: Including procedures, antibiotic start  and stop dates in addition to other pertinent events   . Echo 4/2 1. Left ventricular ejection fraction, by estimation, is 45 to 50%. The  left ventricle has mildly decreased function. The left ventricle  demonstrates global hypokinesis. There is mild left ventricular  hypertrophy. Left ventricular diastolic parameters were normal.  2. Right ventricular systolic function is normal. The right ventricular  size is normal. There is moderately elevated pulmonary artery systolic  pressure. The estimated right ventricular systolic pressure is 28.3 mmHg.  3. The mitral valve is normal in structure. No evidence of mitral valve  regurgitation. No evidence of mitral stenosis.  4. The aortic valve is normal in structure. Aortic valve regurgitation is  not visualized. No aortic stenosis is present.  5. The inferior vena cava is normal in size with greater than 50%  respiratory variability, suggesting right atrial pressure of 3 mmHg.  Marland Kitchen Resp culture (trach) 4/5 >>>    Scheduled Meds: . acidophilus  1 capsule Oral TID  . atorvastatin  20 mg Oral Daily  . azithromycin  500 mg Oral Daily  . Chlorhexidine Gluconate Cloth  6 each Topical Daily  . digoxin  125 mcg Oral Daily  . furosemide  80 mg Intravenous BID  . gabapentin  600 mg Oral QHS  . guaiFENesin-dextromethorphan  10 mL Oral Q8H  . insulin aspart  0-9 Units Subcutaneous TID WC  . insulin glargine  16 Units Subcutaneous BID  . ipratropium-albuterol  3 mL Nebulization TID  . levothyroxine  50 mcg Oral Q0600  . metoprolol succinate  25 mg Oral Daily  . mometasone  1 application Topical  Daily  . pantoprazole  40 mg Oral Daily  . potassium chloride SA  40 mEq Oral Daily  . potassium chloride  40 mEq Oral Once  . sertraline  50 mg Oral QHS  . spironolactone  25 mg Oral BID  . tamsulosin  0.4 mg Oral Daily  . warfarin  4 mg Oral ONCE-1600  . Warfarin - Pharmacist Dosing Inpatient   Does not apply q1600   Continuous Infusions: PRN  Meds:.acetaminophen **OR** acetaminophen, albuterol, LORazepam, nitroGLYCERIN, polyethylene glycol, traMADol     Interim History / Subjective:  Sitting up on side of bed all smiles   Objective   Blood pressure 95/72, pulse (!) 59, temperature 98 F (36.7 C), temperature source Oral, resp. rate 19, height 4\' 10"  (1.473 m), weight 98.3 kg, SpO2 91 %.    FiO2 (%):  [40 %] 40 %   Intake/Output Summary (Last 24 hours) at 12/10/2020 1249 Last data filed at 12/10/2020 0600 Gross per 24 hour  Intake 120 ml  Output 3100 ml  Net -2980 ml   Filed Weights   12/08/20 0500 12/09/20 0500 12/10/20 0428  Weight: 97.5 kg 97.3 kg 98.3 kg    Examination: Tmax  98.6  General appearance:   Obese wf more chronically than acutely ill at this point At Rest 02 sats 91% on ? fio2 0.40 but getting 02 from both the NP and trach collar   No jvd Oropharynx clear,  mucosa nl Neck supple Lungs with a few scattered exp > insp rhonchi bilaterally RRR no s3 or or sign murmur Abd obese with litied excursion  Extr warm with no edema or clubbing noted/ chronic venous stasis changes tender to touch  Neuro  Sensorium intact ,  no apparent motor deficits          Assessment & Plan:    1) chronic hypoxemic and hypercarbic resp failure secondary to OHS/ VC paresis  with unusual set up where she can't breathe comfortably with trach plugged  But only uses nasal 02 so this leaves trach open 24/7 and subject to mucus plugs and entrainment of RA > desats > decmpensation  >>> key is to assure adequate 02 sats 24/7 with same set up she will be using at home and I prefer trach 02/ humdity for this purpose hs and titrate daytime if wants to not use the speaking valve but leave open and self plug > Dr Constance Holster paged 4/5 to my cell and no response, left message again am 4/6   2) Biventricular chf with tendency to anasarca refractory to demadex/zaroxylyn ? Adherence?  >>> 4/5 added aldactone 25 mg bid to offset physiologic  hyperadrenalism from diuretic rx > tol well so far    3) Morbid obesity / hypothyroidism Lab Results  Component Value Date   TSH 7.254 (H) 12/10/2020   >>> need to correct TSH down below 2.0 to assure adequate ventilatory drive and help with neg  Calorie balance     Labs   CBC: Recent Labs  Lab 12/05/20 1714 12/06/20 0451 12/07/20 0514 12/08/20 0313 12/09/20 0628 12/10/20 0712  WBC 8.0 6.0 12.0* 14.0* 10.9* 8.7  NEUTROABS 6.0  --   --   --   --   --   HGB 13.4 12.8 12.7 12.8 13.8 13.2  HCT 43.8 41.5 41.1 42.0 45.3 43.1  MCV 95.6 95.4 96.3 95.7 97.0 95.4  PLT 220 203 224 269 255 408    Basic Metabolic Panel: Recent Labs  Lab 12/06/20 0451  12/07/20 0514 12/08/20 0313 12/09/20 0628 12/10/20 0712  NA 135 137 139 140 140  K 4.3 3.3* 3.1* 3.7 3.3*  CL 88* 87* 87* 83* 81*  CO2 33* 37* 39* 44* 43*  GLUCOSE 348* 227* 52* 57* 76  BUN 36* 36* 41* 42* 42*  CREATININE 1.48* 1.44* 1.49* 1.45* 1.30*  CALCIUM 8.3* 8.2* 8.4* 8.5* 8.4*   GFR: Estimated Creatinine Clearance: 49.3 mL/min (A) (by C-G formula based on SCr of 1.3 mg/dL (H)). Recent Labs  Lab 12/07/20 0514 12/07/20 0736 12/07/20 1029 12/08/20 0313 12/09/20 0628 12/10/20 0712  WBC 12.0*  --   --  14.0* 10.9* 8.7  LATICACIDVEN  --  2.2* 4.0*  --   --   --     Liver Function Tests: Recent Labs  Lab 12/05/20 1714 12/06/20 0451 12/07/20 0514 12/08/20 0313 12/09/20 0628  AST 14* 11* 11* 13* 15  ALT 23 21 19 20 19   ALKPHOS 133* 122 102 95 92  BILITOT 0.8 0.9 0.8 0.7 1.2  PROT 6.9 6.4* 6.4* 6.3* 6.5  ALBUMIN 2.8* 2.6* 2.6* 2.7* 2.9*   Recent Labs  Lab 12/05/20 1714  LIPASE 27   No results for input(s): AMMONIA in the last 168 hours.  ABG    Component Value Date/Time   PHART 7.454 (H) 12/09/2020 0845   PCO2ART 66.6 (HH) 12/09/2020 0845   PO2ART 66.3 (L) 12/09/2020 0845   HCO3 42.5 (H) 12/09/2020 0845   TCO2 30 10/16/2018 1408   ACIDBASEDEF 2.0 10/15/2018 1111   O2SAT 92.1 12/09/2020 0845      Coagulation Profile: Recent Labs  Lab 12/06/20 0451 12/07/20 0514 12/08/20 0313 12/09/20 0628 12/10/20 0712  INR 2.6* 3.1* 3.2* 1.8* 1.6*    Cardiac Enzymes: No results for input(s): CKTOTAL, CKMB, CKMBINDEX, TROPONINI in the last 168 hours.  HbA1C: Hgb A1c MFr Bld  Date/Time Value Ref Range Status  12/05/2020 06:56 PM 9.1 (H) 4.8 - 5.6 % Final    Comment:    (NOTE) Pre diabetes:          5.7%-6.4%  Diabetes:              >6.4%  Glycemic control for   <7.0% adults with diabetes   08/01/2020 12:51 PM 8.2 (H) 4.8 - 5.6 % Final    Comment:    (NOTE) Pre diabetes:          5.7%-6.4%  Diabetes:              >6.4%  Glycemic control for   <7.0% adults with diabetes     CBG: Recent Labs  Lab 12/09/20 1148 12/09/20 1622 12/09/20 2232 12/10/20 0823 12/10/20 1130  GLUCAP 132* 280* 295* 77 290*     Christinia Gully, MD Pul[facility] [place] [phone]   After 7:00 pm call Elink  [phone]

## 2020-12-10 NOTE — Progress Notes (Signed)
PROGRESS NOTE  Theresa [name] QQI:[id number] DOB: 03/21/1966 DOA: 12/05/2020 PCP: Monico Blitz, MD  Brief History:  55 year old female with systolic and diastolic CHF, COPD, continued tobacco abuse, diabetes mellitus type 2, lupus anticoagulant syndrome, coronary artery disease with history of STEMI, hypertension, chronic respiratory failure on 4 L, chronic tracheostomy presenting withone week hx of sob. Notably, the patient was recently admitted to the hospital from 08/01/20 to 08/07/20 with  acute on chronic respiratory failure due to COPD exacerbation.  Notably, the patient was recently admitted to the hospital from 07/21/2020 to 07/29/2020 when she was treated for acute on chronic respiratory failure secondary to decompensated CHF. It appeared that her discharge weight was 93.3 kg (205.7 lbs).  She denied any fevers, chills, chest pain, hemoptysis, nausea, vomiting, diarrhea, abdominal pain, headache, visual disturbance, focal extremity weakness. She denies any dysuria, hematuria, hematochezia, melena.  Assessment/Plan: Acute on chronic respiratory failure with hypoxia -due to COPD exacerbation and CHF exacerbation -usually on 4L trach collar at home -currently on 8L trach collar (40% FiO2) -wean oxygen as tolerated back to baseline -personally reviewed CXR--increase interstitial markings  Acute on chronic combined CHF -dry weight 206-207 lbs -appreciate cardiology consult -remains fluid overloaded -continue IV lasix 80 mg bid -continue spiro -04/20/2020 echo EF 40-45%, grade 3 DD -07/02/20 echo EF 50%, global HK, indeterminant diastolic -05/10/16 Echo EF 40-81%, global HK -NEG 8.4L  COPD Exacerbation -continueprednisone -continue duonebs -pulmonary consult appreciated  Coronary Artery Disease -CAD (s/p CABG in 2007, DES to SVG-PDA in 10/2015, cath in 03/2018 showing patent LIMA-LAD and total occlusion of SVG-RCA and RCA territory supplied by collaterals from LAD  and LCx  Hypokalemia -replete -check mag  CKD stage IIIb -Baseline creatinine 1.2-1.5 -serum creatinine peaked 1.76 at [facility] on 11/25 -Holding Bumex x 4 days-->restart am 12/1 at slightly lower dose-->6 mg daily (previously 6 mg AM, 4 mg PM)  Atrial flutter -Remain on telemetry -currently in sinus -continue digoxin  Chronic systolic and diastolic CHF -4/48/1856 echo EF 40-45%, grade 3 DD -07/02/20 echo EF 50%, global HK, indeterminant diastolic -holding bumex last 4 days-->restart am 12/1 -clinically euvolemic  Lupus anticoagulant syndrome -coumadin with pharmacy assistance  Coronary artery disease -No chest pain presently  Uncontrolled diabetes mellitus type 2 with hyperglycemia -continue lantus to 16 units bid -NovoLog sliding scale -06/16/20-hemoglobin A1c--8.9 -08/01/20 A1c--8.2 -12/05/20 A1C--9.1 -holding Ozempic  Depression -Continue sertraline and home dose lorazepam  Hyponatremia -due to liver cirrhosis and CHF  Hypothyroidism -continue levothyroxine      Status is: Inpatient  Remains inpatient appropriate because:Hemodynamically unstable and IV treatments appropriate due to intensity of illness or inability to take PO   Dispo: The patient is from: Home              Anticipated d/c is to: Home              Patient currently is not medically stable to d/c.   Difficult to place patient No        Family Communication:  no Family at bedside  Consultants:  Cardiology, pulm  Code Status:  FULL  DVT Prophylaxis: warfarin   Procedures: As Listed in Progress Note Above  Antibiotics: azithro 4/3>>     Subjective: Patient denies fevers, chills, headache, chest pain, dyspnea, nausea, vomiting, diarrhea, abdominal pain, dysuria, hematuria, hematochezia, and melena.   Objective: Vitals:   12/10/20 0428 12/10/20 0500 12/10/20 0600 12/10/20 0734  BP:  126/61 122/67 Marland Kitchen)  114/52  Pulse:  (!) 55 (!) 38 (!) 52  Resp:  (!) 23 19 15    Temp:      TempSrc:      SpO2:  99% 95% 91%  Weight: 98.3 kg     Height:        Intake/Output Summary (Last 24 hours) at 12/10/2020 0836 Last data filed at 12/10/2020 0600 Gross per 24 hour  Intake 120 ml  Output 3100 ml  Net -2980 ml   Weight change: 1 kg Exam:   General:  Pt is alert, follows commands appropriately, not in acute distress  HEENT: No icterus, No thrush, No neck mass, [place]/AT  Cardiovascular: RRR, S1/S2, no rubs, no gallops  Respiratory: bibasilar crackles. No wheeze  Abdomen: Soft/+BS, non tender, non distended, no guarding  Extremities: 1+ LE edema, No lymphangitis, No petechiae, No rashes, no synovitis   Data Reviewed: I have personally reviewed following labs and imaging studies Basic Metabolic Panel: Recent Labs  Lab 12/06/20 0451 12/07/20 0514 12/08/20 0313 12/09/20 0628 12/10/20 0712  NA 135 137 139 140 140  K 4.3 3.3* 3.1* 3.7 3.3*  CL 88* 87* 87* 83* 81*  CO2 33* 37* 39* 44* 43*  GLUCOSE 348* 227* 52* 57* 76  BUN 36* 36* 41* 42* 42*  CREATININE 1.48* 1.44* 1.49* 1.45* 1.30*  CALCIUM 8.3* 8.2* 8.4* 8.5* 8.4*   Liver Function Tests: Recent Labs  Lab 12/05/20 1714 12/06/20 0451 12/07/20 0514 12/08/20 0313 12/09/20 0628  AST 14* 11* 11* 13* 15  ALT 23 21 19 20 19   ALKPHOS 133* 122 102 95 92  BILITOT 0.8 0.9 0.8 0.7 1.2  PROT 6.9 6.4* 6.4* 6.3* 6.5  ALBUMIN 2.8* 2.6* 2.6* 2.7* 2.9*   Recent Labs  Lab 12/05/20 1714  LIPASE 27   No results for input(s): AMMONIA in the last 168 hours. Coagulation Profile: Recent Labs  Lab 12/06/20 0451 12/07/20 0514 12/08/20 0313 12/09/20 0628 12/10/20 0712  INR 2.6* 3.1* 3.2* 1.8* 1.6*   CBC: Recent Labs  Lab 12/05/20 1714 12/06/20 0451 12/07/20 0514 12/08/20 0313 12/09/20 0628 12/10/20 0712  WBC 8.0 6.0 12.0* 14.0* 10.9* 8.7  NEUTROABS 6.0  --   --   --   --   --   HGB 13.4 12.8 12.7 12.8 13.8 13.2  HCT 43.8 41.5 41.1 42.0 45.3 43.1  MCV 95.6 95.4 96.3 95.7 97.0 95.4  PLT  220 203 224 269 255 230   Cardiac Enzymes: No results for input(s): CKTOTAL, CKMB, CKMBINDEX, TROPONINI in the last 168 hours. BNP: Invalid input(s): POCBNP CBG: Recent Labs  Lab 12/09/20 0734 12/09/20 1148 12/09/20 1622 12/09/20 2232 12/10/20 0823  GLUCAP 68* 132* 280* 295* 77   HbA1C: No results for input(s): HGBA1C in the last 72 hours. Urine analysis:    Component Value Date/Time   COLORURINE STRAW (A) 12/07/2020 0136   APPEARANCEUR CLEAR 12/07/2020 0136   LABSPEC 1.006 12/07/2020 0136   PHURINE 5.0 12/07/2020 0136   GLUCOSEU NEGATIVE 12/07/2020 0136   HGBUR NEGATIVE 12/07/2020 0136   BILIRUBINUR NEGATIVE 12/07/2020 0136   KETONESUR NEGATIVE 12/07/2020 0136   PROTEINUR NEGATIVE 12/07/2020 0136   NITRITE NEGATIVE 12/07/2020 0136   LEUKOCYTESUR NEGATIVE 12/07/2020 0136   Sepsis Labs: @LABRCNTIP (procalcitonin:4,lacticidven:4) ) Recent Results (from the past 240 hour(s))  Resp Panel by RT-PCR (Flu A&B, Covid) Nasopharyngeal Swab     Status: None   Collection Time: 12/05/20  5:58 PM   Specimen: Nasopharyngeal Swab; Nasopharyngeal(NP) swabs in vial transport medium  Result Value Ref Range Status   SARS Coronavirus 2 by RT PCR NEGATIVE NEGATIVE Final    Comment: (NOTE) SARS-CoV-2 target nucleic acids are NOT DETECTED.  The SARS-CoV-2 RNA is generally detectable in upper respiratory specimens during the acute phase of infection. The lowest concentration of SARS-CoV-2 viral copies this assay can detect is 138 copies/mL. A negative result does not preclude SARS-Cov-2 infection and should not be used as the sole basis for treatment or other patient management decisions. A negative result may occur with  improper specimen collection/handling, submission of specimen other than nasopharyngeal swab, presence of viral mutation(s) within the areas targeted by this assay, and inadequate number of viral copies(<138 copies/mL). A negative result must be combined with clinical  observations, patient history, and epidemiological information. The expected result is Negative.  Fact Sheet for Patients:  EntrepreneurPulse.com.au  Fact Sheet for Healthcare Providers:  IncredibleEmployment.be  This test is no t yet approved or cleared by the [place] FDA and  has been authorized for detection and/or diagnosis of SARS-CoV-2 by FDA under an Emergency Use Authorization (EUA). This EUA will remain  in effect (meaning this test can be used) for the duration of the COVID-19 declaration under Section 564(b)(1) of the Act, 21 U.S.C.section 360bbb-3(b)(1), unless the authorization is terminated  or revoked sooner.       Influenza A by PCR NEGATIVE NEGATIVE Final   Influenza B by PCR NEGATIVE NEGATIVE Final    Comment: (NOTE) The Xpert Xpress SARS-CoV-2/FLU/RSV plus assay is intended as an aid in the diagnosis of influenza from Nasopharyngeal swab specimens and should not be used as a sole basis for treatment. Nasal washings and aspirates are unacceptable for Xpert Xpress SARS-CoV-2/FLU/RSV testing.  Fact Sheet for Patients: EntrepreneurPulse.com.au  Fact Sheet for Healthcare Providers: IncredibleEmployment.be  This test is not yet approved or cleared by the [place] FDA and has been authorized for detection and/or diagnosis of SARS-CoV-2 by FDA under an Emergency Use Authorization (EUA). This EUA will remain in effect (meaning this test can be used) for the duration of the COVID-19 declaration under Section 564(b)(1) of the Act, 21 U.S.C. section 360bbb-3(b)(1), unless the authorization is terminated or revoked.  Performed at [facility], [address], [place], [place] [address]   MRSA PCR Screening     Status: None   Collection Time: 12/06/20 12:40 AM   Specimen: Nasal Mucosa; Nasopharyngeal  Result Value Ref Range Status   MRSA by PCR NEGATIVE NEGATIVE Final    Comment:         The GeneXpert MRSA Assay (FDA approved for NASAL specimens only), is one component of a comprehensive MRSA colonization surveillance program. It is not intended to diagnose MRSA infection nor to guide or monitor treatment for MRSA infections. Performed at [facility], [address] [place], [place] [address]      Scheduled Meds: . acidophilus  1 capsule Oral TID  . atorvastatin  20 mg Oral Daily  . azithromycin  500 mg Oral Daily  . Chlorhexidine Gluconate Cloth  6 each Topical Daily  . digoxin  125 mcg Oral Daily  . furosemide  80 mg Intravenous BID  . gabapentin  600 mg Oral QHS  . guaiFENesin-dextromethorphan  10 mL Oral Q8H  . insulin aspart  0-9 Units Subcutaneous TID WC  . insulin glargine  16 Units Subcutaneous BID  . ipratropium-albuterol  3 mL Nebulization TID  . levothyroxine  50 mcg Oral Q0600  . metoprolol succinate  25 mg Oral  Daily  . mometasone  1 application Topical Daily  . pantoprazole  40 mg Oral Daily  . potassium chloride SA  40 mEq Oral Daily  . potassium chloride  40 mEq Oral Once  . predniSONE  40 mg Oral Q breakfast  . sertraline  50 mg Oral QHS  . spironolactone  25 mg Oral BID  . tamsulosin  0.4 mg Oral Daily  . Warfarin - Pharmacist Dosing Inpatient   Does not apply q1600   Continuous Infusions:  Procedures/Studies: DG Chest 1 View  Result Date: 12/05/2020 CLINICAL DATA:  55 year old female with shortness of breath. EXAM: CHEST  1 VIEW COMPARISON:  Chest radiograph dated 10/09/2020. FINDINGS: Tracheostomy with tip above the carina. There is cardiomegaly with vascular congestion. Pneumonia is not excluded. Overall improved congestion compared to the prior radiograph. No focal consolidation or pneumothorax. No large pleural effusion. Median sternotomy wires. No acute osseous pathology. IMPRESSION: Cardiomegaly with vascular congestion. Electronically Signed   By: Anner Crete M.D.   On: 12/05/2020 17:32   DG CHEST PORT 1 VIEW  Result  Date: 12/07/2020 CLINICAL DATA:  Tracheostomy, dyspnea, CHF EXAM: PORTABLE CHEST 1 VIEW COMPARISON:  Chest radiograph from one day prior. FINDINGS: Tracheostomy tube tip overlies the tracheal air column at the thoracic inlet. Intact sternotomy wires. Stable cardiomediastinal silhouette with moderate cardiomegaly. No pneumothorax. Probable trace bilateral pleural effusions. Suture line at the right costophrenic angle again noted. Mild pulmonary edema. No acute consolidative airspace disease. IMPRESSION: 1. Mild congestive heart failure. 2. Probable trace bilateral pleural effusions. Electronically Signed   By: Ilona Sorrel M.D.   On: 12/07/2020 10:15   DG CHEST PORT 1 VIEW  Result Date: 12/06/2020 CLINICAL DATA:  Shortness of breath. EXAM: PORTABLE CHEST 1 VIEW COMPARISON:  12/05/2020 FINDINGS: Tracheostomy tube appears in adequate position. Sternotomy wires unchanged. Lordotic technique is demonstrated as lungs are adequately inflated without lobar consolidation or effusion. Surgical suture line over the right base unchanged. Mild stable prominence of the pulmonary vasculature which may indicate a mild degree of vascular congestion. Stable cardiomegaly. Remainder of the exam is unchanged. IMPRESSION: Stable cardiomegaly with suggestion of mild vascular congestion. Electronically Signed   By: Marin Olp M.D.   On: 12/06/2020 08:19   ECHOCARDIOGRAM LIMITED  Result Date: 12/06/2020    ECHOCARDIOGRAM LIMITED REPORT   Patient Name:   JAMYRA ZWEIG Date of Exam: 12/06/2020 Medical Rec #:  [id number]       Height:       58.0 in Accession #:    [id number]      Weight:       221.3 lb Date of Birth:  [birth year]       BSA:          1.901 m Patient Age:    36 years        BP:           125/47 mmHg Patient Gender: F               HR:           70 bpm. Exam Location:  [facility] Procedure: Limited Echo Indications:    Abnormal ECG R94.31  History:        Patient has prior history of Echocardiogram examinations, most                  recent 07/02/2020. CAD, COPD, Signs/Symptoms:Dyspnea; Risk  Factors:Diabetes, Hypertension, Dyslipidemia and Current Smoker.  Sonographer:    Leavy Cella RDCS (AE) Referring Phys: [id number] IMPRESSIONS  1. Left ventricular ejection fraction, by estimation, is 45 to 50%. The left ventricle has mildly decreased function. The left ventricle demonstrates global hypokinesis. There is mild left ventricular hypertrophy. Left ventricular diastolic parameters were normal.  2. Right ventricular systolic function is normal. The right ventricular size is normal. There is moderately elevated pulmonary artery systolic pressure. The estimated right ventricular systolic pressure is 49.7 mmHg.  3. The mitral valve is normal in structure. No evidence of mitral valve regurgitation. No evidence of mitral stenosis.  4. The aortic valve is normal in structure. Aortic valve regurgitation is not visualized. No aortic stenosis is present.  5. The inferior vena cava is normal in size with greater than 50% respiratory variability, suggesting right atrial pressure of 3 mmHg. Comparison(s): No significant change from prior study. Prior images reviewed side by side. FINDINGS  Left Ventricle: Left ventricular ejection fraction, by estimation, is 45 to 50%. The left ventricle has mildly decreased function. The left ventricle demonstrates global hypokinesis. Definity contrast agent was given IV to delineate the left ventricular  endocardial borders. The left ventricular internal cavity size was normal in size. There is mild left ventricular hypertrophy. Left ventricular diastolic parameters were normal.  LV Wall Scoring: The mid inferoseptal segment and apical septal segment are akinetic. Right Ventricle: The right ventricular size is normal. No increase in right ventricular wall thickness. Right ventricular systolic function is normal. There is moderately elevated pulmonary artery systolic pressure. The  tricuspid regurgitant velocity is 3.50 m/s, and with an assumed right atrial pressure of 3 mmHg, the estimated right ventricular systolic pressure is 02.6 mmHg. Left Atrium: Left atrial size was normal in size. Right Atrium: Right atrial size was normal in size. Pericardium: There is no evidence of pericardial effusion. Mitral Valve: The mitral valve is normal in structure. No evidence of mitral valve stenosis. Tricuspid Valve: The tricuspid valve is normal in structure. Tricuspid valve regurgitation is mild . No evidence of tricuspid stenosis. Aortic Valve: The aortic valve is normal in structure. Aortic valve regurgitation is not visualized. No aortic stenosis is present. Pulmonic Valve: The pulmonic valve was normal in structure. Pulmonic valve regurgitation is mild. No evidence of pulmonic stenosis. Aorta: The aortic root is normal in size and structure. Venous: The inferior vena cava is normal in size with greater than 50% respiratory variability, suggesting right atrial pressure of 3 mmHg. IAS/Shunts: No atrial level shunt detected by color flow Doppler. LEFT VENTRICLE PLAX 2D LVIDd:         4.19 cm  Diastology LVIDs:         3.10 cm  LV e' medial:    3.98 cm/s LV PW:         1.52 cm  LV E/e' medial:  16.2 LV IVS:        1.29 cm  LV e' lateral:   7.20 cm/s LVOT diam:     1.80 cm  LV E/e' lateral: 8.9 LVOT Area:     2.54 cm  LEFT ATRIUM         Index LA diam:    4.30 cm 2.26 cm/m   AORTA Ao Root diam: 3.00 cm MITRAL VALVE               TRICUSPID VALVE MV Area (PHT): 2.91 cm    TR Peak grad:   49.0 mmHg MV Decel  Time: 261 msec    TR Vmax:        350.00 cm/s MV E velocity: 64.30 cm/s MV A velocity: 47.10 cm/s  SHUNTS MV E/A ratio:  1.37        Systemic Diam: 1.80 cm Candee Furbish MD Electronically signed by Candee Furbish MD Signature Date/Time: 12/06/2020/1:27:33 PM    Final     Orson Eva, DO  [facility]  If 7PM-7AM, please contact night-coverage [email or web address] Password [email or web address] 12/10/2020, 8:36 AM    LOS: 5 days

## 2020-12-10 NOTE — Progress Notes (Signed)
Physical Therapy Treatment Patient Details Name: NEFTALI THUROW MRN: [id number] DOB: 1966/06/07 Today's Date: 12/10/2020    History of Present Illness Kaziyah Parkison  is a 55 y.o. female, with systolic and diastolic CHF, COPD, continued tobacco abuse, diabetes mellitus type 2, lupus anticoagulant syndrome, coronary artery disease with history of STEMI, hypertension, chronic respiratory failure on 4 L, chronic tracheostomy (since 2002).  -Patient presents today secondary to complaints of shortness of breath, over the last week, she reports worsening dyspnea, leg swelling, per EMS she was 70% on her baseline 4 L nasal cannula, in ED she was 82% on 4 L, she is currently requiring ATC at 28% (10 L), denies fever, chills, chest pain, abdominal pain, no dysuria or polyuria, reports she has been taking torsemide as instructed, reports she has been compliant with her low-salt diet as well, .  -In ED her chest x-ray significant cardiomegaly with vascular congestion, her BNP elevated at 195, glucose elevated at 314, she was wheezing, dyspnea has improved with IV Lasix and IV Solu-Medrol, [facility] consulted to admit.    PT Comments    Pt tolerated treatment better today vs yesterday.    Follow Up Recommendations   HH vs SNF      Equipment Recommendations   none needed     Recommendations for Other Services  none     Precautions / Restrictions Precautions Precautions: Fall    Mobility  Bed Mobility Overal bed mobility: Modified Independent                  Transfers Overall transfer level: Modified independent Equipment used: Rolling walker (2 wheeled) Transfers: Sit to/from [facility] Sit to Stand: Modified independent (Device/Increase time) Stand pivot transfers: Modified independent (Device/Increase time)          Ambulation/Gait Ambulation/Gait assistance: Modified independent (Device/Increase time) Gait Distance (Feet): 5 Feet Assistive device:  Rolling walker (2 wheeled) Gait Pattern/deviations: Decreased step length - right;Decreased step length - left;Decreased stride length Gait velocity: decreased      Standing at chair for standing tolerance        Cognition Arousal/Alertness: Awake/alert Behavior During Therapy: WFL for tasks assessed/performed Overall Cognitive Status: Within Functional Limits for tasks assessed                                        Exercises General Exercises - Lower Extremity Ankle Circles/Pumps: AROM;10 reps Long Arc Quad: 10 reps;Seated Heel Slides: 10 reps;Seated Hip ABduction/ADduction: 10 reps;Both        Pertinent Vitals/Pain Pain Assessment: No/denies pain    Home Living Family/patient expects to be discharged to:: Private residence Living Arrangements: Spouse/significant other Available Help at Discharge: Family;Available 24 hours/day Type of Home: House Home Access: Stairs to enter Entrance Stairs-Rails: None Home Layout: One level Home Equipment: Environmental consultant - 2 wheels;Cane - single point;Bedside commode;Wheelchair - manual;Tub bench;Shower seat      Prior Function Level of Independence: Needs assistance  Gait / Transfers Assistance Needed: Household ambulator leaning on walls and nearby objects or uses RW ADL's / Homemaking Assistance Needed: assisted by family     PT Goals (current goals can now be found in the care plan section) Acute Rehab PT Goals Patient Stated Goal: return home with family to assist PT Goal Formulation: With patient/family Time For Goal Achievement: 12/16/20 Potential to Achieve Goals: Good    Frequency  TIW       PT Plan  Continue to increase activity tolerance           End of Session               Time: 2902-1115 PT Time Calculation (min) (ACUTE ONLY): 38 min  Charges:  $Therapeutic Activity: 8-22 mins                        Rayetta Humphrey, PT CLT [phone] 12/10/2020, 4:38 PM

## 2020-12-10 NOTE — Progress Notes (Signed)
ANTICOAGULATION CONSULT NOTE -  Pharmacy Consult for Warfarin Indication: VTE Treatment - Continuing PTA  Allergies  Allergen Reactions  . Penicillins Rash    Has patient had a PCN reaction causing immediate rash, facial/tongue/throat swelling, SOB or lightheadedness with hypotension: Yes Has patient had a PCN reaction causing severe rash involving mucus membranes or skin necrosis: No Has patient had a PCN reaction that required hospitalization No Has patient had a PCN reaction occurring within the last 10 years: No If all of the above answers are "NO", then may proceed with Cephalosporin use.   REACTION: rash Pt has tolerated cefepime in the past.  . Ciprofloxacin     nausea  . Ibuprofen Rash    swelling in leg     Patient Measurements: Height: 4\' 10"  (147.3 cm) Weight: 98.3 kg (216 lb 11.4 oz) IBW/kg (Calculated) : 40.9  Vital Signs: Temp: 98.2 F (36.8 C) (04/06 0023) Temp Source: Oral (04/06 0023) BP: 114/52 (04/06 0734) Pulse Rate: 52 (04/06 0734)  Labs: Recent Labs    12/08/20 0313 12/09/20 0628 12/10/20 0712  HGB 12.8 13.8 13.2  HCT 42.0 45.3 43.1  PLT 269 255 230  LABPROT 31.5* 20.2* 18.2*  INR 3.2* 1.8* 1.6*  CREATININE 1.49* 1.45* 1.30*    Estimated Creatinine Clearance: 49.3 mL/min (A) (by C-G formula based on SCr of 1.3 mg/dL (H)).   Medical History: Past Medical History:  Diagnosis Date  . Acute kidney failure with lesion of tubular necrosis (HCC)   . Acute systolic heart failure ([place])   . CAD (coronary artery disease)    a. s/p prior PCI. b. CABG 2007 at [facility] in [place] 2007. c. inferior STEMI 10/2015 s/p DES to dSVG-PDA.  . Cardiac arrest ([place])   . Cervical cancer ([place])   . Chronic diastolic CHF (congestive heart failure) ([place])   . Chronic respiratory failure ([place])    s/p tracheostomy 2002  . Chronic RUQ pain   . COPD (chronic obstructive pulmonary disease) ([place])   . Diabetes mellitus ([place])   . DVT (deep venous  thrombosis) ([place])   . Endometriosis   . History of gallstones 01/2016   seen on Ultrasound  . History of HIDA scan 11/2016   normal  . HTN (hypertension)   . Hyperlipidemia   . Lupus anticoagulant disorder (HCC)    on coumadin  . Morbid obesity ([place])   . Psoriasis   . ST elevation (STEMI) myocardial infarction involving right coronary artery ([place]) 10/29/15   stent to VG to PDA  . Toe fracture, right 03/29/2018  . Tracheostomy in place [facility]), chronic since 2002 11/03/2015     Assessment: 55 yo female presented on 12/05/2020 with SOB, diarrhea and leg swelling. Patient is on warfarin prior to admission for lupus anticoagulant with history of VTE and Afib.    Prior to admission regimen (per RN who discussed w/ pt): warfarin 2.5mg  daily except 5mg  on Mondays/Fridays. Managed per PCP.     INR 3.2 > 1.8 > 1.6  Goal of Therapy:  INR 2-3 Monitor platelets by anticoagulation protocol: Yes   Plan:  Warfarin 4 mg po x 1 today Monitor INR, CBC and s/s of bleeding daily  Margot Ables, PharmD Clinical Pharmacist 12/10/2020 8:33 AM

## 2020-12-10 NOTE — Progress Notes (Signed)
Inpatient Diabetes Program Recommendations  AACE/ADA: New Consensus Statement on Inpatient Glycemic Control (2015)  Target Ranges:  Prepandial:   less than 140 mg/dL      Peak postprandial:   less than 180 mg/dL (1-2 hours)      Critically ill patients:  140 - 180 mg/dL   Lab Results  Component Value Date   GLUCAP 77 12/10/2020   HGBA1C 9.1 (H) 12/05/2020    Review of Glycemic Control Results for FERRIS, TALLY (MRN [id number]) as of 12/10/2020 11:04  Ref. Range 12/09/2020 07:34 12/09/2020 11:48 12/09/2020 16:22 12/09/2020 22:32 12/10/2020 08:23  Glucose-Capillary Latest Ref Range: 70 - 99 mg/dL 68 (L) 132 (H) 280 (H) 295 (H) 77   Diabetes history: DM 2 Outpatient Diabetes medications:Tresiba 40 units q HS, Novolog 1-10 units tid with meals Current orders for Inpatient glycemic control: Prednisone 40 mg daily Lantus 16 units bid Novolog sensitive tid with meals and HS Inpatient Diabetes Program Recommendations:    Consider adding Novolog 2 units tid with meals (hold if patient eats less than 50% or NPO).   Thanks,  Adah Perl, RN, BC-ADM Inpatient Diabetes Coordinator Pager [phone] (8a-5p)

## 2020-12-10 NOTE — Progress Notes (Signed)
Progress Note  Patient Name: Theresa [name] Date of Encounter: 12/10/2020  [facility] Cardiologist: [name], MD   Subjective   Ongoing SOB Inpatient Medications    Scheduled Meds: . acidophilus  1 capsule Oral TID  . atorvastatin  20 mg Oral Daily  . azithromycin  500 mg Oral Daily  . Chlorhexidine Gluconate Cloth  6 each Topical Daily  . digoxin  125 mcg Oral Daily  . furosemide  80 mg Intravenous BID  . gabapentin  600 mg Oral QHS  . guaiFENesin-dextromethorphan  10 mL Oral Q8H  . insulin aspart  0-9 Units Subcutaneous TID WC  . insulin glargine  16 Units Subcutaneous BID  . ipratropium-albuterol  3 mL Nebulization TID  . levothyroxine  50 mcg Oral Q0600  . metoprolol succinate  25 mg Oral Daily  . mometasone  1 application Topical Daily  . pantoprazole  40 mg Oral Daily  . potassium chloride SA  40 mEq Oral Daily  . predniSONE  40 mg Oral Q breakfast  . sertraline  50 mg Oral QHS  . spironolactone  25 mg Oral BID  . tamsulosin  0.4 mg Oral Daily  . Warfarin - Pharmacist Dosing Inpatient   Does not apply q1600   Continuous Infusions:  PRN Meds: acetaminophen **OR** acetaminophen, albuterol, LORazepam, nitroGLYCERIN, polyethylene glycol, traMADol   Vital Signs    Vitals:   12/10/20 0428 12/10/20 0500 12/10/20 0600 12/10/20 0734  BP:  126/61 122/67 (!) 114/52  Pulse:  (!) 55 (!) 38 (!) 52  Resp:  (!) 23 19 15   Temp:      TempSrc:      SpO2:  99% 95% 91%  Weight: 98.3 kg     Height:        Intake/Output Summary (Last 24 hours) at 12/10/2020 0749 Last data filed at 12/10/2020 0600 Gross per 24 hour  Intake 240 ml  Output 3100 ml  Net -2860 ml   Last 3 Weights 12/10/2020 12/09/2020 12/08/2020  Weight (lbs) 216 lb 11.4 oz 214 lb 8.1 oz 214 lb 15.2 oz  Weight (kg) 98.3 kg 97.3 kg 97.5 kg      Telemetry    SR and sinus bradycardia- Personally Reviewed  ECG    n/a - Personally Reviewed  Physical Exam   GEN: No acute distress.   Neck: No  JVD Cardiac: RRR, no murmurs, rubs, or gallops.  Respiratory: crackles bilateral bases GI: Soft, nontender, non-distended  MS: No edema; No deformity. Neuro:  Nonfocal  Psych: Normal affect   Labs    High Sensitivity Troponin:   Recent Labs  Lab 12/05/20 1714 12/05/20 1856  TROPONINIHS 15 14      Chemistry Recent Labs  Lab 12/07/20 0514 12/08/20 0313 12/09/20 0628 12/10/20 0712  NA 137 139 140 140  K 3.3* 3.1* 3.7 3.3*  CL 87* 87* 83* 81*  CO2 37* 39* 44* 43*  GLUCOSE 227* 52* 57* 76  BUN 36* 41* 42* 42*  CREATININE 1.44* 1.49* 1.45* 1.30*  CALCIUM 8.2* 8.4* 8.5* 8.4*  PROT 6.4* 6.3* 6.5  --   ALBUMIN 2.6* 2.7* 2.9*  --   AST 11* 13* 15  --   ALT 19 20 19   --   ALKPHOS 102 95 92  --   BILITOT 0.8 0.7 1.2  --   GFRNONAA 43* 41* 43* 49*  ANIONGAP 13 13 13  16*     Hematology Recent Labs  Lab 12/08/20 2423 12/09/20 5361 12/10/20 4431  WBC 14.0* 10.9* 8.7  RBC 4.39 4.67 4.52  HGB 12.8 13.8 13.2  HCT 42.0 45.3 43.1  MCV 95.7 97.0 95.4  MCH 29.2 29.6 29.2  MCHC 30.5 30.5 30.6  RDW 16.7* 16.2* 15.9*  PLT 269 255 230    BNP Recent Labs  Lab 12/05/20 1714  BNP 695.0*     DDimer No results for input(s): DDIMER in the last 168 hours.   Radiology    No results found.  Cardiac Studies     Patient Profile     Theresa [name] is a 55 y.o. female with past medical history of CAD (s/p CABG in 2007, DES to SVG-PDA in 10/2015, cath in 03/2018 showing patent LIMA-LAD and total occlusion of SVG-RCA and RCA territory supplied by collaterals from LAD and LCx), tracheostomy (placed in 2002), chronic combined systolic and diastolic CHF, paroxysmal atrial flutter, HTN, HLD, COPD, afib, continued tobacco use, and prior DVT (known lupus anticoagulant) who is being seen today for the evaluation of CHF at the request of Dr. Roger Shelter.  Assessment & Plan    1. Acute on chronic combined systolic/diastolic HF - 05/14/32 echo LVEF 82-50%, normal diastolic function,  normal RV, PASP 52 - from primary cardiology note baseline weight around 207 lbs. Listed as 216 lbs today -she is no IV lasix 80mg  bid increased just yesterday evening. Neg 2.8 L yesterday, she is neg 8.4 L this admission though total I/Os are incomplete. Downtrend in Cr with diuresis consistent with venous congestion and CHF - continue IV diuresis.    2.Hypokalemia - secondary to diuertics - she is on KCl 76mEq daily, give additional 51mEq today  For questions or updates, please contact [place] Please consult [email or web address] for contact info under        Signed, Carlyle Dolly, MD  12/10/2020, 7:49 AM

## 2020-12-11 DIAGNOSIS — I5043 Acute on chronic combined systolic (congestive) and diastolic (congestive) heart failure: Secondary | ICD-10-CM | POA: Diagnosis not present

## 2020-12-11 DIAGNOSIS — J441 Chronic obstructive pulmonary disease with (acute) exacerbation: Secondary | ICD-10-CM | POA: Diagnosis not present

## 2020-12-11 DIAGNOSIS — I272 Pulmonary hypertension, unspecified: Secondary | ICD-10-CM | POA: Diagnosis not present

## 2020-12-11 DIAGNOSIS — I4892 Unspecified atrial flutter: Secondary | ICD-10-CM | POA: Diagnosis not present

## 2020-12-11 LAB — GLUCOSE, CAPILLARY
Glucose-Capillary: 79 mg/dL (ref 70–99)
Glucose-Capillary: 275 mg/dL — ABNORMAL HIGH (ref 70–99)
Glucose-Capillary: 189 mg/dL — ABNORMAL HIGH (ref 70–99)
Glucose-Capillary: 117 mg/dL — ABNORMAL HIGH (ref 70–99)

## 2020-12-11 LAB — BASIC METABOLIC PANEL
Anion gap: 15 (ref 5–15)
BUN: 46 mg/dL — ABNORMAL HIGH (ref 6–20)
CO2: 49 mmol/L — ABNORMAL HIGH (ref 22–32)
Calcium: 9.1 mg/dL (ref 8.9–10.3)
Chloride: 78 mmol/L — ABNORMAL LOW (ref 98–111)
Creatinine, Ser: 1.55 mg/dL — ABNORMAL HIGH (ref 0.44–1.00)
GFR, Estimated: 39 mL/min — ABNORMAL LOW (ref >60)
Glucose, Bld: 75 mg/dL (ref 70–99)
Potassium: 3.9 mmol/L (ref 3.5–5.1)
Sodium: 142 mmol/L (ref 135–145)

## 2020-12-11 LAB — CBC
HCT: 46 % (ref 36.0–46.0)
Hemoglobin: 13.7 g/dL (ref 12.0–15.0)
MCH: 28.9 pg (ref 26.0–34.0)
MCHC: 29.8 g/dL — ABNORMAL LOW (ref 30.0–36.0)
MCV: 97 fL (ref 80.0–100.0)
Platelets: 264 10*3/uL (ref 150–400)
RBC: 4.74 MIL/uL (ref 3.87–5.11)
RDW: 15.9 % — ABNORMAL HIGH (ref 11.5–15.5)
WBC: 10.3 10*3/uL (ref 4.0–10.5)
nRBC: 0 % (ref 0.0–0.2)

## 2020-12-11 LAB — PROTIME-INR
INR: 1.5 — ABNORMAL HIGH (ref 0.8–1.2)
Prothrombin Time: 17.9 seconds — ABNORMAL HIGH (ref 11.4–15.2)

## 2020-12-11 LAB — MAGNESIUM: Magnesium: 1.9 mg/dL (ref 1.7–2.4)

## 2020-12-11 MED ORDER — INSULIN ASPART 100 UNIT/ML ~~LOC~~ SOLN
2.0000 [IU] | Freq: Three times a day (TID) | SUBCUTANEOUS | Status: DC
  Administered 2020-12-11 – 2020-12-12 (×2): 2 [IU] via SUBCUTANEOUS

## 2020-12-11 MED ORDER — INSULIN GLARGINE 100 UNIT/ML ~~LOC~~ SOLN
14.0000 [IU] | Freq: Two times a day (BID) | SUBCUTANEOUS | Status: DC
  Administered 2020-12-11 – 2020-12-12 (×2): 14 [IU] via SUBCUTANEOUS
  Filled 2020-12-11 (×8): qty 0.14

## 2020-12-11 MED ORDER — WARFARIN SODIUM 2 MG PO TABS
4.0000 mg | ORAL_TABLET | Freq: Once | ORAL | Status: AC
  Administered 2020-12-11: 4 mg via ORAL
  Filled 2020-12-11: qty 2

## 2020-12-11 NOTE — Progress Notes (Signed)
ANTICOAGULATION CONSULT NOTE -  Pharmacy Consult for Warfarin Indication: VTE Treatment - Continuing PTA  Allergies  Allergen Reactions  . Penicillins Rash    Has patient had a PCN reaction causing immediate rash, facial/tongue/throat swelling, SOB or lightheadedness with hypotension: Yes Has patient had a PCN reaction causing severe rash involving mucus membranes or skin necrosis: No Has patient had a PCN reaction that required hospitalization No Has patient had a PCN reaction occurring within the last 10 years: No If all of the above answers are "NO", then may proceed with Cephalosporin use.   REACTION: rash Pt has tolerated cefepime in the past.  . Ciprofloxacin     nausea  . Ibuprofen Rash    swelling in leg     Patient Measurements: Height: 4\' 11"  (149.9 cm) Weight: 94.6 kg (208 lb 8.9 oz) IBW/kg (Calculated) : 43.2  Vital Signs: Temp: 97.6 F (36.4 C) (04/07 0336) Temp Source: Oral (04/06 2055) BP: 113/62 (04/07 0336) Pulse Rate: 50 (04/07 0354)  Labs: Recent Labs    12/09/20 0628 12/10/20 0712 12/11/20 0606  HGB 13.8 13.2 13.7  HCT 45.3 43.1 46.0  PLT 255 230 264  LABPROT 20.2* 18.2* 17.9*  INR 1.8* 1.6* 1.5*  CREATININE 1.45* 1.30* 1.55*    Estimated Creatinine Clearance: 41.3 mL/min (A) (by C-G formula based on SCr of 1.55 mg/dL (H)).   Medical History: Past Medical History:  Diagnosis Date  . Acute kidney failure with lesion of tubular necrosis (HCC)   . Acute systolic heart failure ([place])   . CAD (coronary artery disease)    a. s/p prior PCI. b. CABG 2007 at [facility] in [place] 2007. c. inferior STEMI 10/2015 s/p DES to dSVG-PDA.  . Cardiac arrest ([place])   . Cervical cancer ([place])   . Chronic diastolic CHF (congestive heart failure) ([place])   . Chronic respiratory failure ([place])    s/p tracheostomy 2002  . Chronic RUQ pain   . COPD (chronic obstructive pulmonary disease) ([place])   . Diabetes mellitus ([place])   . DVT (deep venous  thrombosis) ([place])   . Endometriosis   . History of gallstones 01/2016   seen on Ultrasound  . History of HIDA scan 11/2016   normal  . HTN (hypertension)   . Hyperlipidemia   . Lupus anticoagulant disorder (HCC)    on coumadin  . Morbid obesity ([place])   . Psoriasis   . ST elevation (STEMI) myocardial infarction involving right coronary artery ([place]) 10/29/15   stent to VG to PDA  . Toe fracture, right 03/29/2018  . Tracheostomy in place [facility]), chronic since 2002 11/03/2015     Assessment: 55 yo female presented on 12/05/2020 with SOB, diarrhea and leg swelling. Patient is on warfarin prior to admission for lupus anticoagulant with history of VTE and Afib.    Prior to admission regimen (per RN who discussed w/ pt): warfarin 2.5mg  daily except 5mg  on Mondays/Fridays. Managed per PCP.     INR 3.2 > 1.8 > 1.6>1.5  Goal of Therapy:  INR 2-3 Monitor platelets by anticoagulation protocol: Yes   Plan:  Warfarin 4 mg po x 1 today Monitor INR, CBC and s/s of bleeding daily  Margot Ables, PharmD Clinical Pharmacist 12/11/2020 8:01 AM

## 2020-12-11 NOTE — Progress Notes (Signed)
Pt taken off the 5lpm cann and left on the 35% trach collar for night, RT will continue to monitor spo2 throughout the night

## 2020-12-11 NOTE — Progress Notes (Signed)
Pt asleep spo2 85% on 35% trach collar and collar not over pt trach  increased o2 40% spo2 increased to 93%

## 2020-12-11 NOTE — Progress Notes (Signed)
Assumed  Pt care at 0700. Pt resting comfortably in bed in no acute distress. Trach collar intact, O2 @ 7L and [place] O2 @ 4L. No S&S of respiratory distress. A&O. Husband at bedside. Tolerating diet. Denies pain. LBM 12/09/20. Pt states that she is breathing better today. Call bell within reach. Will continue to monitor.

## 2020-12-11 NOTE — Progress Notes (Signed)
NAME:  Theresa [name], MRN:  [id number], DOB:  September 14, 1965, LOS: 6 ADMISSION DATE:  12/05/2020, CONSULTATION DATE:  4/5 REFERRING MD:  Shahmehdi   CHIEF COMPLAINT:  Resp failure acute on chronic   Brief patient profile:  68 yowf  MO/ smoker  Trach/02dep since 2002 p ? inhalational injury and   has  R>L VC paresis care per [place] ent = [place] and admitted with recurrent resp failure/ anasarca and asked PCCM asked to see 4/5     History of Present Illness:  55 y.o. female, with systolic and diastolic CHF, COPD, continued tobacco abuse, diabetes mellitus type 2, lupus anticoagulant syndrome, coronary artery disease with history of STEMI, hypertension, chronic respiratory failure on 4 L, chronic tracheostomy (since 2002). -Patient presents today secondary to complaints of shortness of breath, over the last week, she reports worsening dyspnea, leg swelling, per EMS she was 70% on her baseline 4 L nasal cannula, in ED she was 82% on 4 L, she is currently requiring ATC at 28% (10 L), denies fever, chills, chest pain, abdominal pain, no dysuria or polyuria, reports she has been taking torsemide as instructed, reports she has been compliant with her low-salt diet as well, . -In ED her chest x-ray significant cardiomegaly with vascular congestion, her BNP elevated at 195, glucose elevated at 314, she was wheezing, dyspnea has improved with IV Lasix and IV Solu-Medrol, [facility] hospitalist consulted to admit.2  Pertinent  Medical History  Acute kidney failure with lesion of tubular necrosis Acute systolic heart failure CAD Cervical cancer Chronic diastolic CHF (congestive heart failure)  Chronic respiratory failure ([place]),  COPD   Diabetes mellitus DVT (deep venous thrombosis)  Lupus anticoagulant disorder  Endometriosis History of gallstones  HTN  Hyperlipidemia Morbid obesity Tracheostomy in place chronic since 2002 (11/03/2015).    Significant Hospital Events: Including procedures, antibiotic start  and stop dates in addition to other pertinent events   . Echo 4/2 1. Left ventricular ejection fraction, by estimation, is 45 to 50%. The  left ventricle has mildly decreased function. The left ventricle  demonstrates global hypokinesis. There is mild left ventricular  hypertrophy. Left ventricular diastolic parameters were normal.  2. Right ventricular systolic function is normal. The right ventricular  size is normal. There is moderately elevated pulmonary artery systolic  pressure. The estimated right ventricular systolic pressure is 36.1 mmHg.  3. The mitral valve is normal in structure. No evidence of mitral valve  regurgitation. No evidence of mitral stenosis.  4. The aortic valve is normal in structure. Aortic valve regurgitation is  not visualized. No aortic stenosis is present.  5. The inferior vena cava is normal in size with greater than 50%  respiratory variability, suggesting right atrial pressure of 3 mmHg.  Marland Kitchen Resp culture (trach) 4/5 >>> . 12/05/2020 try just trach collar HS     Scheduled Meds: . acidophilus  1 capsule Oral TID  . atorvastatin  20 mg Oral Daily  . azithromycin  500 mg Oral Daily  . Chlorhexidine Gluconate Cloth  6 each Topical Daily  . digoxin  125 mcg Oral Daily  . gabapentin  600 mg Oral QHS  . guaiFENesin-dextromethorphan  10 mL Oral Q8H  . insulin aspart  0-9 Units Subcutaneous TID WC  . insulin glargine  16 Units Subcutaneous BID  . ipratropium-albuterol  3 mL Nebulization TID  . levothyroxine  100 mcg Oral Q0600  . metoprolol succinate  25 mg Oral Daily  . mometasone  1 application Topical  Daily  . pantoprazole  40 mg Oral Daily  . potassium chloride SA  40 mEq Oral Daily  . sertraline  50 mg Oral QHS  . spironolactone  25 mg Oral BID  . tamsulosin  0.4 mg Oral Daily  . warfarin  4 mg Oral ONCE-1600  . Warfarin - Pharmacist Dosing Inpatient   Does not apply q1600   Continuous Infusions: PRN Meds:.acetaminophen **OR** acetaminophen,  albuterol, LORazepam, nitroGLYCERIN, polyethylene glycol, traMADol     Interim History / Subjective:  Sitting up in bed at 60 degrees hob eating lunch   Objective   Blood pressure 113/62, pulse (!) 54, temperature 97.6 F (36.4 C), resp. rate 16, height 4\' 11"  (1.499 m), weight 94.6 kg, SpO2 95 %.    FiO2 (%):  [35 %-40 %] 35 %   Intake/Output Summary (Last 24 hours) at 12/11/2020 1354 Last data filed at 12/11/2020 1126 Gross per 24 hour  Intake 480 ml  Output 1800 ml  Net -1320 ml   Filed Weights   12/10/20 0428 12/10/20 1840 12/11/20 0500  Weight: 98.3 kg 94.5 kg 94.6 kg    Examination: Tmax   98,3   sats 95 % on 5lpm NP and 35% trach collar  General appearance:   pleast obes wf nad  Oropharynx clear,  mucosa nl/ eating ok  Neck supple Lungs with a few scattered exp > insp rhonchi bilaterally RRR no s3 or or sign murmur Abd obese with limited insp  excursion  Extr warm with no edema or clubbing noted Neuro  Sensorium intact,  no apparent motor deficits        Assessment & Plan:    1) chronic hypoxemic and hypercarbic resp failure secondary to OHS/ VC paresis  with unusual set up where she can't breathe comfortably with trach plugged  But only uses nasal 02 so this leaves trach open 24/7 and subject to mucus plugs and entrainment of RA > desats > decmpensation  >>> key is to assure adequate 02 sats 24/7 with same set up she will be using at home and I prefer trach 02/ humdity for this purpose hs and titrate daytime if wants to not use the speaking valve but leave open and self plug > Dr Felipa Emory and I talked today and he is agreeable to just the trach collar / humidfied at hs and I have written to leave the nasal 02 off while asleep and titrate the 02 to provide sats 90-94% so we know what to rec at discharge  2) Biventricular chf with tendency to anasarca refractory to demadex/zaroxylyn ? Adherence?  >>> 4/5 added aldactone 25 mg bid to offset physiologic hyperadrenalism  from diuretic rx > tol well so far    3) Morbid obesity / hypothyroidism Lab Results  Component Value Date   TSH 7.254 (H) 12/10/2020   >>> doubled dose of synthroid 4/6 wto 100 mcg daily with goal of < 2.0 TSH in this setting     Labs   CBC: Recent Labs  Lab 12/05/20 1714 12/06/20 0451 12/07/20 0514 12/08/20 0313 12/09/20 0628 12/10/20 0712 12/11/20 0606  WBC 8.0   < > 12.0* 14.0* 10.9* 8.7 10.3  NEUTROABS 6.0  --   --   --   --   --   --   HGB 13.4   < > 12.7 12.8 13.8 13.2 13.7  HCT 43.8   < > 41.1 42.0 45.3 43.1 46.0  MCV 95.6   < > 96.3 95.7 97.0 95.4  97.0  PLT 220   < > 224 269 255 230 264   < > = values in this interval not displayed.    Basic Metabolic Panel: Recent Labs  Lab 12/07/20 0514 12/08/20 0313 12/09/20 0628 12/10/20 0712 12/11/20 0606  NA 137 139 140 140 142  K 3.3* 3.1* 3.7 3.3* 3.9  CL 87* 87* 83* 81* 78*  CO2 37* 39* 44* 43* 49*  GLUCOSE 227* 52* 57* 76 75  BUN 36* 41* 42* 42* 46*  CREATININE 1.44* 1.49* 1.45* 1.30* 1.55*  CALCIUM 8.2* 8.4* 8.5* 8.4* 9.1  MG  --   --   --   --  1.9   GFR: Estimated Creatinine Clearance: 41.3 mL/min (A) (by C-G formula based on SCr of 1.55 mg/dL (H)). Recent Labs  Lab 12/07/20 0736 12/07/20 1029 12/08/20 0313 12/09/20 0628 12/10/20 0712 12/11/20 0606  WBC  --   --  14.0* 10.9* 8.7 10.3  LATICACIDVEN 2.2* 4.0*  --   --   --   --     Liver Function Tests: Recent Labs  Lab 12/05/20 1714 12/06/20 0451 12/07/20 0514 12/08/20 0313 12/09/20 0628  AST 14* 11* 11* 13* 15  ALT 23 21 19 20 19   ALKPHOS 133* 122 102 95 92  BILITOT 0.8 0.9 0.8 0.7 1.2  PROT 6.9 6.4* 6.4* 6.3* 6.5  ALBUMIN 2.8* 2.6* 2.6* 2.7* 2.9*   Recent Labs  Lab 12/05/20 1714  LIPASE 27   No results for input(s): AMMONIA in the last 168 hours.  ABG    Component Value Date/Time   PHART 7.454 (H) 12/09/2020 0845   PCO2ART 66.6 (HH) 12/09/2020 0845   PO2ART 66.3 (L) 12/09/2020 0845   HCO3 42.5 (H) 12/09/2020 0845   TCO2  30 10/16/2018 1408   ACIDBASEDEF 2.0 10/15/2018 1111   O2SAT 92.1 12/09/2020 0845     Coagulation Profile: Recent Labs  Lab 12/07/20 0514 12/08/20 0313 12/09/20 0628 12/10/20 0712 12/11/20 0606  INR 3.1* 3.2* 1.8* 1.6* 1.5*    Cardiac Enzymes: No results for input(s): CKTOTAL, CKMB, CKMBINDEX, TROPONINI in the last 168 hours.  HbA1C: Hgb A1c MFr Bld  Date/Time Value Ref Range Status  12/05/2020 06:56 PM 9.1 (H) 4.8 - 5.6 % Final    Comment:    (NOTE) Pre diabetes:          5.7%-6.4%  Diabetes:              >6.4%  Glycemic control for   <7.0% adults with diabetes   08/01/2020 12:51 PM 8.2 (H) 4.8 - 5.6 % Final    Comment:    (NOTE) Pre diabetes:          5.7%-6.4%  Diabetes:              >6.4%  Glycemic control for   <7.0% adults with diabetes     CBG: Recent Labs  Lab 12/10/20 1130 12/10/20 1708 12/10/20 2052 12/11/20 0725 12/11/20 1117  GLUCAP 290* 203* 254* 79 275*     Christinia Gully, MD Pulmonary and [place] [phone]   After 7:00 pm call [place]  [phone]

## 2020-12-11 NOTE — Progress Notes (Signed)
PROGRESS NOTE  Theresa [name] UJW:[id number] DOB: 1966/07/07 DOA: 12/05/2020 PCP: Monico Blitz, MD   Brief History:  55 year old female with systolic and diastolic CHF, COPD, continued tobacco abuse, diabetes mellitus type 2, lupus anticoagulant syndrome, coronary artery disease with history of STEMI, hypertension, chronic respiratory failure on 4 L, chronic tracheostomy presenting withone week hx of sob. Notably, the patient was recently admitted to the hospital from 08/01/20 to 08/07/20 with  acute on chronic respiratory failure due to COPD exacerbation.  Notably, the patient was recently admitted to the hospital from 07/21/2020 to 07/29/2020 when she was treated for acute on chronic respiratory failure secondary to decompensated CHF. It appeared that her discharge weight was 93.3 kg (205.7 lbs).  She denied any fevers, chills, chest pain, hemoptysis, nausea, vomiting, diarrhea, abdominal pain, headache, visual disturbance, focal extremity weakness. She denies any dysuria, hematuria, hematochezia, melena.  Assessment/Plan: Acute on chronic respiratory failure with hypoxia -due to COPD exacerbation and CHF exacerbation -usually on 4L trach collar at home -currently on 8L trach collar (40% FiO2) -wean oxygen as tolerated back to baseline -personally reviewed CXR--increase interstitial markings  Acute on chronic combined CHF -dry weight 206-207 lbs -appreciate cardiology consult -continue IV lasix 80 mg bid>>once daily -continue spiro -04/20/2020 echo EF 40-45%, grade 3 DD -07/02/20 echo EF 50%, global HK, indeterminant diastolic -01/10/20 Echo EF 30-86%, global HK -NEG 10.3L  COPD Exacerbation -continueprednisone -continue duonebs -pulmonary consult appreciated  Coronary Artery Disease -CAD (s/p CABG in 2007, DES to SVG-PDA in 10/2015, cath in 03/2018 showing patent LIMA-LAD and total occlusion of SVG-RCA and RCA territory supplied by collaterals from LAD and  LCx  Hypokalemia -replete -check mag--1.9  CKD stage IIIb -Baseline creatinine 1.2-1.5 -serum creatinine peaked 1.76 at [facility] on 11/25 -monitor with diuresis  Atrial flutter -Remain on telemetry -currently in sinus -continue digoxin  Lupus anticoagulant syndrome -coumadin with pharmacy assistance  Coronary artery disease -No chest pain presently  Uncontrolled diabetes mellitus type 2 with hyperglycemia -decrease lantus to 14 units bid -NovoLog sliding scale -06/16/20-hemoglobin A1c--8.9 -08/01/20 A1c--8.2 -12/05/20 A1C--9.1 -holding Ozempic  Depression -Continue sertraline and home dose lorazepam  Hyponatremia -due to liver cirrhosis and CHF  Hypothyroidism -continue levothyroxine      Status is: Inpatient  Remains inpatient appropriate because:Hemodynamically unstable and IV treatments appropriate due to intensity of illness or inability to take PO   Dispo: The patient is from: Home  Anticipated d/c is to: Home  Patient currently is not medically stable to d/c.              Difficult to place patient No        Family Communication:  no Family at bedside  Consultants:  Cardiology, pulm  Code Status:  FULL  DVT Prophylaxis: warfarin   Procedures: As Listed in Progress Note Above  Antibiotics: azithro 4/3>>    Subjective: Patient denies fevers, chills, headache, chest pain, dyspnea, nausea, vomiting, diarrhea, abdominal pain, dysuria, hematuria, hematochezia, and melena.   Objective: Vitals:   12/11/20 0915 12/11/20 1236 12/11/20 1401 12/11/20 1507  BP:   (!) 100/49   Pulse: (!) 54  (!) 56   Resp:      Temp:   98 F (36.7 C)   TempSrc:   Oral   SpO2:  95% 94% 96%  Weight:      Height:        Intake/Output Summary (Last 24 hours) at 12/11/2020 1655 Last data filed  at 12/11/2020 1327 Gross per 24 hour  Intake 720 ml  Output 1800 ml  Net -1080 ml   Weight change: -3.8  kg Exam:   General:  Pt is alert, follows commands appropriately, not in acute distress  HEENT: No icterus, No thrush, No neck mass, [place]/AT  Cardiovascular: RRR, S1/S2, no rubs, no gallops  Respiratory: bibasilar crackles. No wheeze  Abdomen: Soft/+BS, non tender, non distended, no guarding  Extremities: No edema, No lymphangitis, No petechiae, No rashes, no synovitis   Data Reviewed: I have personally reviewed following labs and imaging studies Basic Metabolic Panel: Recent Labs  Lab 12/07/20 0514 12/08/20 0313 12/09/20 0628 12/10/20 0712 12/11/20 0606  NA 137 139 140 140 142  K 3.3* 3.1* 3.7 3.3* 3.9  CL 87* 87* 83* 81* 78*  CO2 37* 39* 44* 43* 49*  GLUCOSE 227* 52* 57* 76 75  BUN 36* 41* 42* 42* 46*  CREATININE 1.44* 1.49* 1.45* 1.30* 1.55*  CALCIUM 8.2* 8.4* 8.5* 8.4* 9.1  MG  --   --   --   --  1.9   Liver Function Tests: Recent Labs  Lab 12/05/20 1714 12/06/20 0451 12/07/20 0514 12/08/20 0313 12/09/20 0628  AST 14* 11* 11* 13* 15  ALT 23 21 19 20 19   ALKPHOS 133* 122 102 95 92  BILITOT 0.8 0.9 0.8 0.7 1.2  PROT 6.9 6.4* 6.4* 6.3* 6.5  ALBUMIN 2.8* 2.6* 2.6* 2.7* 2.9*   Recent Labs  Lab 12/05/20 1714  LIPASE 27   No results for input(s): AMMONIA in the last 168 hours. Coagulation Profile: Recent Labs  Lab 12/07/20 0514 12/08/20 0313 12/09/20 0628 12/10/20 0712 12/11/20 0606  INR 3.1* 3.2* 1.8* 1.6* 1.5*   CBC: Recent Labs  Lab 12/05/20 1714 12/06/20 0451 12/07/20 0514 12/08/20 0313 12/09/20 0628 12/10/20 0712 12/11/20 0606  WBC 8.0   < > 12.0* 14.0* 10.9* 8.7 10.3  NEUTROABS 6.0  --   --   --   --   --   --   HGB 13.4   < > 12.7 12.8 13.8 13.2 13.7  HCT 43.8   < > 41.1 42.0 45.3 43.1 46.0  MCV 95.6   < > 96.3 95.7 97.0 95.4 97.0  PLT 220   < > 224 269 255 230 264   < > = values in this interval not displayed.   Cardiac Enzymes: No results for input(s): CKTOTAL, CKMB, CKMBINDEX, TROPONINI in the last 168 hours. BNP: Invalid  input(s): POCBNP CBG: Recent Labs  Lab 12/10/20 1708 12/10/20 2052 12/11/20 0725 12/11/20 1117 12/11/20 1604  GLUCAP 203* 254* 79 275* 189*   HbA1C: No results for input(s): HGBA1C in the last 72 hours. Urine analysis:    Component Value Date/Time   COLORURINE STRAW (A) 12/07/2020 0136   APPEARANCEUR CLEAR 12/07/2020 0136   LABSPEC 1.006 12/07/2020 0136   PHURINE 5.0 12/07/2020 0136   GLUCOSEU NEGATIVE 12/07/2020 0136   HGBUR NEGATIVE 12/07/2020 0136   BILIRUBINUR NEGATIVE 12/07/2020 0136   KETONESUR NEGATIVE 12/07/2020 0136   PROTEINUR NEGATIVE 12/07/2020 0136   NITRITE NEGATIVE 12/07/2020 0136   LEUKOCYTESUR NEGATIVE 12/07/2020 0136   Sepsis Labs: @LABRCNTIP (procalcitonin:4,lacticidven:4) ) Recent Results (from the past 240 hour(s))  Resp Panel by RT-PCR (Flu A&B, Covid) Nasopharyngeal Swab     Status: None   Collection Time: 12/05/20  5:58 PM   Specimen: Nasopharyngeal Swab; Nasopharyngeal(NP) swabs in vial transport medium  Result Value Ref Range Status   SARS Coronavirus 2 by RT  PCR NEGATIVE NEGATIVE Final    Comment: (NOTE) SARS-CoV-2 target nucleic acids are NOT DETECTED.  The SARS-CoV-2 RNA is generally detectable in upper respiratory specimens during the acute phase of infection. The lowest concentration of SARS-CoV-2 viral copies this assay can detect is 138 copies/mL. A negative result does not preclude SARS-Cov-2 infection and should not be used as the sole basis for treatment or other patient management decisions. A negative result may occur with  improper specimen collection/handling, submission of specimen other than nasopharyngeal swab, presence of viral mutation(s) within the areas targeted by this assay, and inadequate number of viral copies(<138 copies/mL). A negative result must be combined with clinical observations, patient history, and epidemiological information. The expected result is Negative.  Fact Sheet for Patients:   EntrepreneurPulse.com.au  Fact Sheet for Healthcare Providers:  IncredibleEmployment.be  This test is no t yet approved or cleared by the [place] FDA and  has been authorized for detection and/or diagnosis of SARS-CoV-2 by FDA under an Emergency Use Authorization (EUA). This EUA will remain  in effect (meaning this test can be used) for the duration of the COVID-19 declaration under Section 564(b)(1) of the Act, 21 U.S.C.section 360bbb-3(b)(1), unless the authorization is terminated  or revoked sooner.       Influenza A by PCR NEGATIVE NEGATIVE Final   Influenza B by PCR NEGATIVE NEGATIVE Final    Comment: (NOTE) The Xpert Xpress SARS-CoV-2/FLU/RSV plus assay is intended as an aid in the diagnosis of influenza from Nasopharyngeal swab specimens and should not be used as a sole basis for treatment. Nasal washings and aspirates are unacceptable for Xpert Xpress SARS-CoV-2/FLU/RSV testing.  Fact Sheet for Patients: EntrepreneurPulse.com.au  Fact Sheet for Healthcare Providers: IncredibleEmployment.be  This test is not yet approved or cleared by the [place] FDA and has been authorized for detection and/or diagnosis of SARS-CoV-2 by FDA under an Emergency Use Authorization (EUA). This EUA will remain in effect (meaning this test can be used) for the duration of the COVID-19 declaration under Section 564(b)(1) of the Act, 21 U.S.C. section 360bbb-3(b)(1), unless the authorization is terminated or revoked.  Performed at [facility], [address] [place], [place] [address]   MRSA PCR Screening     Status: None   Collection Time: 12/06/20 12:40 AM   Specimen: Nasal Mucosa; Nasopharyngeal  Result Value Ref Range Status   MRSA by PCR NEGATIVE NEGATIVE Final    Comment:        The GeneXpert MRSA Assay (FDA approved for NASAL specimens only), is one component of a comprehensive MRSA  colonization surveillance program. It is not intended to diagnose MRSA infection nor to guide or monitor treatment for MRSA infections. Performed at [facility], [address], [place], [place] [address]      Scheduled Meds: . acidophilus  1 capsule Oral TID  . atorvastatin  20 mg Oral Daily  . azithromycin  500 mg Oral Daily  . Chlorhexidine Gluconate Cloth  6 each Topical Daily  . digoxin  125 mcg Oral Daily  . gabapentin  600 mg Oral QHS  . guaiFENesin-dextromethorphan  10 mL Oral Q8H  . insulin aspart  0-9 Units Subcutaneous TID WC  . insulin glargine  16 Units Subcutaneous BID  . ipratropium-albuterol  3 mL Nebulization TID  . levothyroxine  100 mcg Oral Q0600  . metoprolol succinate  25 mg Oral Daily  . mometasone  1 application Topical Daily  . pantoprazole  40 mg Oral Daily  . potassium  chloride SA  40 mEq Oral Daily  . sertraline  50 mg Oral QHS  . spironolactone  25 mg Oral BID  . tamsulosin  0.4 mg Oral Daily  . warfarin  4 mg Oral ONCE-1600  . Warfarin - Pharmacist Dosing Inpatient   Does not apply q1600   Continuous Infusions:  Procedures/Studies: DG Chest 1 View  Result Date: 12/05/2020 CLINICAL DATA:  55 year old female with shortness of breath. EXAM: CHEST  1 VIEW COMPARISON:  Chest radiograph dated 10/09/2020. FINDINGS: Tracheostomy with tip above the carina. There is cardiomegaly with vascular congestion. Pneumonia is not excluded. Overall improved congestion compared to the prior radiograph. No focal consolidation or pneumothorax. No large pleural effusion. Median sternotomy wires. No acute osseous pathology. IMPRESSION: Cardiomegaly with vascular congestion. Electronically Signed   By: Anner Crete M.D.   On: 12/05/2020 17:32   DG CHEST PORT 1 VIEW  Result Date: 12/07/2020 CLINICAL DATA:  Tracheostomy, dyspnea, CHF EXAM: PORTABLE CHEST 1 VIEW COMPARISON:  Chest radiograph from one day prior. FINDINGS: Tracheostomy tube tip overlies the tracheal air  column at the thoracic inlet. Intact sternotomy wires. Stable cardiomediastinal silhouette with moderate cardiomegaly. No pneumothorax. Probable trace bilateral pleural effusions. Suture line at the right costophrenic angle again noted. Mild pulmonary edema. No acute consolidative airspace disease. IMPRESSION: 1. Mild congestive heart failure. 2. Probable trace bilateral pleural effusions. Electronically Signed   By: Ilona Sorrel M.D.   On: 12/07/2020 10:15   DG CHEST PORT 1 VIEW  Result Date: 12/06/2020 CLINICAL DATA:  Shortness of breath. EXAM: PORTABLE CHEST 1 VIEW COMPARISON:  12/05/2020 FINDINGS: Tracheostomy tube appears in adequate position. Sternotomy wires unchanged. Lordotic technique is demonstrated as lungs are adequately inflated without lobar consolidation or effusion. Surgical suture line over the right base unchanged. Mild stable prominence of the pulmonary vasculature which may indicate a mild degree of vascular congestion. Stable cardiomegaly. Remainder of the exam is unchanged. IMPRESSION: Stable cardiomegaly with suggestion of mild vascular congestion. Electronically Signed   By: Marin Olp M.D.   On: 12/06/2020 08:19   ECHOCARDIOGRAM LIMITED  Result Date: 12/06/2020    ECHOCARDIOGRAM LIMITED REPORT   Patient Name:   JAYDI BRAY Date of Exam: 12/06/2020 Medical Rec #:  [id number]       Height:       58.0 in Accession #:    [id number]      Weight:       221.3 lb Date of Birth:  05/14/1966       BSA:          1.901 m Patient Age:    16 years        BP:           125/47 mmHg Patient Gender: F               HR:           70 bpm. Exam Location:  [facility] Procedure: Limited Echo Indications:    Abnormal ECG R94.31  History:        Patient has prior history of Echocardiogram examinations, most                 recent 07/02/2020. CAD, COPD, Signs/Symptoms:Dyspnea; Risk                 Factors:Diabetes, Hypertension, Dyslipidemia and Current Smoker.  Sonographer:    Leavy Cella RDCS (AE)  Referring Phys: [id number] IMPRESSIONS  1. Left ventricular ejection fraction, by  estimation, is 45 to 50%. The left ventricle has mildly decreased function. The left ventricle demonstrates global hypokinesis. There is mild left ventricular hypertrophy. Left ventricular diastolic parameters were normal.  2. Right ventricular systolic function is normal. The right ventricular size is normal. There is moderately elevated pulmonary artery systolic pressure. The estimated right ventricular systolic pressure is 14.4 mmHg.  3. The mitral valve is normal in structure. No evidence of mitral valve regurgitation. No evidence of mitral stenosis.  4. The aortic valve is normal in structure. Aortic valve regurgitation is not visualized. No aortic stenosis is present.  5. The inferior vena cava is normal in size with greater than 50% respiratory variability, suggesting right atrial pressure of 3 mmHg. Comparison(s): No significant change from prior study. Prior images reviewed side by side. FINDINGS  Left Ventricle: Left ventricular ejection fraction, by estimation, is 45 to 50%. The left ventricle has mildly decreased function. The left ventricle demonstrates global hypokinesis. Definity contrast agent was given IV to delineate the left ventricular  endocardial borders. The left ventricular internal cavity size was normal in size. There is mild left ventricular hypertrophy. Left ventricular diastolic parameters were normal.  LV Wall Scoring: The mid inferoseptal segment and apical septal segment are akinetic. Right Ventricle: The right ventricular size is normal. No increase in right ventricular wall thickness. Right ventricular systolic function is normal. There is moderately elevated pulmonary artery systolic pressure. The tricuspid regurgitant velocity is 3.50 m/s, and with an assumed right atrial pressure of 3 mmHg, the estimated right ventricular systolic pressure is 31.5 mmHg. Left Atrium: Left atrial size was  normal in size. Right Atrium: Right atrial size was normal in size. Pericardium: There is no evidence of pericardial effusion. Mitral Valve: The mitral valve is normal in structure. No evidence of mitral valve stenosis. Tricuspid Valve: The tricuspid valve is normal in structure. Tricuspid valve regurgitation is mild . No evidence of tricuspid stenosis. Aortic Valve: The aortic valve is normal in structure. Aortic valve regurgitation is not visualized. No aortic stenosis is present. Pulmonic Valve: The pulmonic valve was normal in structure. Pulmonic valve regurgitation is mild. No evidence of pulmonic stenosis. Aorta: The aortic root is normal in size and structure. Venous: The inferior vena cava is normal in size with greater than 50% respiratory variability, suggesting right atrial pressure of 3 mmHg. IAS/Shunts: No atrial level shunt detected by color flow Doppler. LEFT VENTRICLE PLAX 2D LVIDd:         4.19 cm  Diastology LVIDs:         3.10 cm  LV e' medial:    3.98 cm/s LV PW:         1.52 cm  LV E/e' medial:  16.2 LV IVS:        1.29 cm  LV e' lateral:   7.20 cm/s LVOT diam:     1.80 cm  LV E/e' lateral: 8.9 LVOT Area:     2.54 cm  LEFT ATRIUM         Index LA diam:    4.30 cm 2.26 cm/m   AORTA Ao Root diam: 3.00 cm MITRAL VALVE               TRICUSPID VALVE MV Area (PHT): 2.91 cm    TR Peak grad:   49.0 mmHg MV Decel Time: 261 msec    TR Vmax:        350.00 cm/s MV E velocity: 64.30 cm/s MV A velocity: 47.10 cm/s  SHUNTS  MV E/A ratio:  1.37        Systemic Diam: 1.80 cm Candee Furbish MD Electronically signed by Candee Furbish MD Signature Date/Time: 12/06/2020/1:27:33 PM    Final     Orson Eva, DO  [facility]  If 7PM-7AM, please contact night-coverage [email or web address] Password [email or web address] 12/11/2020, 4:55 PM   LOS: 6 days

## 2020-12-11 NOTE — Progress Notes (Signed)
Inpatient Diabetes Program Recommendations  AACE/ADA: New Consensus Statement on Inpatient Glycemic Control (2015)  Target Ranges:  Prepandial:   less than 140 mg/dL      Peak postprandial:   less than 180 mg/dL (1-2 hours)      Critically ill patients:  140 - 180 mg/dL   Lab Results  Component Value Date   GLUCAP 79 12/11/2020   HGBA1C 9.1 (H) 12/05/2020    Review of Glycemic Control Results for Theresa [name], Theresa [name] (MRN [id number]) as of 12/11/2020 10:04  Ref. Range 12/10/2020 08:23 12/10/2020 11:30 12/10/2020 17:08 12/10/2020 20:52 12/11/2020 07:25  Glucose-Capillary Latest Ref Range: 70 - 99 mg/dL 77 290 (H) 203 (H) 254 (H) 79   Diabetes history: DM 2 Outpatient Diabetes medications:Tresiba 40 units q HS, Novolog 1-10 units tid with meals Current orders for Inpatient glycemic control: Lantus 16 units bid Novolog sensitive tid with meals and HS Inpatient Diabetes Program Recommendations:   Note steroids stopped.  Please consider reducing Lantus to 14 units.  May consider adding Novolog meal coverage 2 units tid with meals (hold if patient eats less than 50% or NPO).   Thanks,  Adah Perl, RN, BC-ADM Inpatient Diabetes Coordinator Pager [phone] (8a-5p)

## 2020-12-11 NOTE — Progress Notes (Signed)
Progress Note  Patient Name: Theresa [name] Date of Encounter: 12/11/2020  [facility] Cardiologist: [name], MD   Subjective   Some improvement in SOB.   Inpatient Medications    Scheduled Meds: . acidophilus  1 capsule Oral TID  . atorvastatin  20 mg Oral Daily  . azithromycin  500 mg Oral Daily  . Chlorhexidine Gluconate Cloth  6 each Topical Daily  . digoxin  125 mcg Oral Daily  . furosemide  80 mg Intravenous BID  . gabapentin  600 mg Oral QHS  . guaiFENesin-dextromethorphan  10 mL Oral Q8H  . insulin aspart  0-9 Units Subcutaneous TID WC  . insulin glargine  16 Units Subcutaneous BID  . ipratropium-albuterol  3 mL Nebulization TID  . levothyroxine  100 mcg Oral Q0600  . metoprolol succinate  25 mg Oral Daily  . mometasone  1 application Topical Daily  . pantoprazole  40 mg Oral Daily  . potassium chloride SA  40 mEq Oral Daily  . sertraline  50 mg Oral QHS  . spironolactone  25 mg Oral BID  . tamsulosin  0.4 mg Oral Daily  . warfarin  4 mg Oral ONCE-1600  . Warfarin - Pharmacist Dosing Inpatient   Does not apply q1600   Continuous Infusions:  PRN Meds: acetaminophen **OR** acetaminophen, albuterol, LORazepam, nitroGLYCERIN, polyethylene glycol, traMADol   Vital Signs    Vitals:   12/11/20 0018 12/11/20 0336 12/11/20 0354 12/11/20 0500  BP:  113/62    Pulse: (!) 54 (!) 50 (!) 50   Resp: 16 19 18    Temp:  97.6 F (36.4 C)    TempSrc:      SpO2: 90% 91% 93%   Weight:    94.6 kg  Height:        Intake/Output Summary (Last 24 hours) at 12/11/2020 0820 Last data filed at 12/11/2020 0700 Gross per 24 hour  Intake --  Output 1900 ml  Net -1900 ml   Last 3 Weights 12/11/2020 12/10/2020 12/10/2020  Weight (lbs) 208 lb 8.9 oz 208 lb 5.4 oz 216 lb 11.4 oz  Weight (kg) 94.6 kg 94.5 kg 98.3 kg      Telemetry    Sinus brady- Personally Reviewed  ECG    n/a - Personally Reviewed  Physical Exam   GEN: No acute distress.   Neck: No JVD Cardiac:  RRR, no murmurs, rubs, or gallops.  Respiratory: mild expiratory wheezing GI: Soft, nontender, non-distended  MS: No edema; No deformity. Neuro:  Nonfocal  Psych: Normal affect   Labs    High Sensitivity Troponin:   Recent Labs  Lab 12/05/20 1714 12/05/20 1856  TROPONINIHS 15 14      Chemistry Recent Labs  Lab 12/07/20 0514 12/08/20 0313 12/09/20 0628 12/10/20 0712 12/11/20 0606  NA 137 139 140 140 142  K 3.3* 3.1* 3.7 3.3* 3.9  CL 87* 87* 83* 81* 78*  CO2 37* 39* 44* 43* 49*  GLUCOSE 227* 52* 57* 76 75  BUN 36* 41* 42* 42* 46*  CREATININE 1.44* 1.49* 1.45* 1.30* 1.55*  CALCIUM 8.2* 8.4* 8.5* 8.4* 9.1  PROT 6.4* 6.3* 6.5  --   --   ALBUMIN 2.6* 2.7* 2.9*  --   --   AST 11* 13* 15  --   --   ALT 19 20 19   --   --   ALKPHOS 102 95 92  --   --   BILITOT 0.8 0.7 1.2  --   --  GFRNONAA 43* 41* 43* 49* 39*  ANIONGAP 13 13 13  16* 15     Hematology Recent Labs  Lab 12/09/20 0628 12/10/20 0712 12/11/20 0606  WBC 10.9* 8.7 10.3  RBC 4.67 4.52 4.74  HGB 13.8 13.2 13.7  HCT 45.3 43.1 46.0  MCV 97.0 95.4 97.0  MCH 29.6 29.2 28.9  MCHC 30.5 30.6 29.8*  RDW 16.2* 15.9* 15.9*  PLT 255 230 264    BNP Recent Labs  Lab 12/05/20 1714  BNP 695.0*     DDimer No results for input(s): DDIMER in the last 168 hours.   Radiology    No results found.  Cardiac Studies   Patient Profile     Theresa Ingber Mooreis a 55 y.o.femalewith past medical history of CAD (s/p CABG in 2007, DES to SVG-PDA in 10/2015, cath in 03/2018 showing patent LIMA-LAD and total occlusion of SVG-RCA and RCA territory supplied by collaterals from LAD and LCx), tracheostomy (placed in 2002), chroniccombined systolic and diastolic CHF, paroxysmal atrial flutter, HTN, HLD, COPD, afib, continued tobacco use, and prior DVT (known lupus anticoagulant)who is being seen today for the evaluation ofCHFat the request ofDr. Shahmehdi.  Assessment & Plan    1. Acute on chronic combined  systolic/diastolic HF - 04/12/75 echo LVEF 72-09%, normal diastolic function, normal RV, PASP 52 - from primary cardiology note baseline weight around 207 lbs. Listed as 208 lbs today though weight trends appear inaccurately recorded. Check standing weight today.  - she is on IV lasix 80mg  bid,I/Os incompletely documented. Some uptrend in Cr, dose IV lasix 80mg  just once today and reevaluate tomorrow.  - likely would start torsemide 80mg  bid and metolazone 5mg  2 times weekly when ready for discharge.        For questions or updates, please contact [place] Please consult [email or web address] for contact info under        Signed, Carlyle Dolly, MD  12/11/2020, 8:20 AM

## 2020-12-12 ENCOUNTER — Other Ambulatory Visit: Payer: Self-pay | Admitting: Internal Medicine

## 2020-12-12 DIAGNOSIS — I5043 Acute on chronic combined systolic (congestive) and diastolic (congestive) heart failure: Secondary | ICD-10-CM | POA: Diagnosis not present

## 2020-12-12 DIAGNOSIS — J9621 Acute and chronic respiratory failure with hypoxia: Secondary | ICD-10-CM | POA: Diagnosis not present

## 2020-12-12 DIAGNOSIS — J9611 Chronic respiratory failure with hypoxia: Secondary | ICD-10-CM

## 2020-12-12 DIAGNOSIS — J9622 Acute and chronic respiratory failure with hypercapnia: Secondary | ICD-10-CM | POA: Diagnosis not present

## 2020-12-12 DIAGNOSIS — J441 Chronic obstructive pulmonary disease with (acute) exacerbation: Secondary | ICD-10-CM | POA: Diagnosis not present

## 2020-12-12 LAB — CBC
HCT: 48.4 % — ABNORMAL HIGH (ref 36.0–46.0)
Hemoglobin: 14.6 g/dL (ref 12.0–15.0)
MCH: 29.1 pg (ref 26.0–34.0)
MCHC: 30.2 g/dL (ref 30.0–36.0)
MCV: 96.4 fL (ref 80.0–100.0)
Platelets: 283 10*3/uL (ref 150–400)
RBC: 5.02 MIL/uL (ref 3.87–5.11)
RDW: 15.9 % — ABNORMAL HIGH (ref 11.5–15.5)
WBC: 11.7 10*3/uL — ABNORMAL HIGH (ref 4.0–10.5)
nRBC: 0 % (ref 0.0–0.2)

## 2020-12-12 LAB — BASIC METABOLIC PANEL
Anion gap: 16 — ABNORMAL HIGH (ref 5–15)
BUN: 45 mg/dL — ABNORMAL HIGH (ref 6–20)
CO2: 45 mmol/L — ABNORMAL HIGH (ref 22–32)
Calcium: 9 mg/dL (ref 8.9–10.3)
Chloride: 80 mmol/L — ABNORMAL LOW (ref 98–111)
Creatinine, Ser: 1.45 mg/dL — ABNORMAL HIGH (ref 0.44–1.00)
GFR, Estimated: 43 mL/min — ABNORMAL LOW (ref >60)
Glucose, Bld: 69 mg/dL — ABNORMAL LOW (ref 70–99)
Potassium: 3.3 mmol/L — ABNORMAL LOW (ref 3.5–5.1)
Sodium: 141 mmol/L (ref 135–145)

## 2020-12-12 LAB — PROTIME-INR
INR: 2 — ABNORMAL HIGH (ref 0.8–1.2)
Prothrombin Time: 21.5 seconds — ABNORMAL HIGH (ref 11.4–15.2)

## 2020-12-12 LAB — GLUCOSE, CAPILLARY
Glucose-Capillary: 68 mg/dL — ABNORMAL LOW (ref 70–99)
Glucose-Capillary: 144 mg/dL — ABNORMAL HIGH (ref 70–99)

## 2020-12-12 LAB — BRAIN NATRIURETIC PEPTIDE: B Natriuretic Peptide: 279 pg/mL — ABNORMAL HIGH (ref 0.0–100.0)

## 2020-12-12 MED ORDER — METOLAZONE 5 MG PO TABS: 5.0000 mg | ORAL_TABLET | Freq: Once | ORAL | Status: DC

## 2020-12-12 MED ORDER — SPIRONOLACTONE 25 MG PO TABS: 25.0000 mg | ORAL_TABLET | Freq: Two times a day (BID) | ORAL | 1 refills | Status: AC

## 2020-12-12 MED ORDER — TORSEMIDE 40 MG PO TABS: 80.0000 mg | ORAL_TABLET | Freq: Two times a day (BID) | ORAL | 1 refills | Status: DC

## 2020-12-12 MED ORDER — TORSEMIDE 20 MG PO TABS
80.0000 mg | ORAL_TABLET | Freq: Two times a day (BID) | ORAL | Status: DC
  Administered 2020-12-12: 80 mg via ORAL
  Filled 2020-12-12: qty 4

## 2020-12-12 MED ORDER — POTASSIUM CHLORIDE CRYS ER 20 MEQ PO TBCR: 60.0000 meq | EXTENDED_RELEASE_TABLET | Freq: Every day | ORAL | Status: DC

## 2020-12-12 MED ORDER — POTASSIUM CHLORIDE CRYS ER 20 MEQ PO TBCR: 40.0000 meq | EXTENDED_RELEASE_TABLET | Freq: Once | ORAL | Status: DC

## 2020-12-12 MED ORDER — METOLAZONE 5 MG PO TABS: 5.0000 mg | ORAL_TABLET | ORAL | Status: DC

## 2020-12-12 MED ORDER — METOLAZONE 5 MG PO TABS: 5.0000 mg | ORAL_TABLET | ORAL | Status: AC

## 2020-12-12 MED ORDER — POTASSIUM CHLORIDE CRYS ER 20 MEQ PO TBCR: 60.0000 meq | EXTENDED_RELEASE_TABLET | Freq: Every day | ORAL | 1 refills | Status: AC

## 2020-12-12 MED ORDER — WARFARIN SODIUM 2 MG PO TABS: 2.0000 mg | ORAL_TABLET | Freq: Once | ORAL | Status: DC

## 2020-12-12 NOTE — Discharge Summary (Signed)
Physician Discharge Summary  Theresa [name] ZOX:[id number] DOB: February 26, 1966 DOA: 12/05/2020  PCP: Monico Blitz, MD  Admit date: 12/05/2020 Discharge date: 12/12/2020  Admitted From: Home Disposition:  Home   Recommendations for Outpatient Follow-up:  1. Follow up with PCP in 1-2 weeks 2. Please obtain BMP/CBC in one week 3. Please follow up on the following pending results:   Equipment/Devices: trach collar humidified oxygen at 40%  Discharge Condition: Stable CODE STATUS: FULL Diet recommendation: Heart Healthy / Carb Modified    Brief/Interim Summary: 55 year old female with systolic and diastolic CHF, COPD, continued tobacco abuse, diabetes mellitus type 2, lupus anticoagulant syndrome, coronary artery disease with history of STEMI, hypertension, chronic respiratory failure on 4 L, chronic tracheostomy presenting withone week hx of sob. Notably, the patient was recently admitted to the hospital from11/26/21 to 08/07/20 with acute on chronic respiratory failure due to COPD exacerbation. Notably, the patient was recently admitted to the hospital from 07/21/2020 to 07/29/2020 when she was treated for acute on chronic respiratory failure secondary to decompensated CHF. It appeared that her discharge weight was 93.3 kg (205.7 lbs).  She denied any fevers, chills, chest pain, hemoptysis, nausea, vomiting, diarrhea, abdominal pain, headache, visual disturbance, focal extremity weakness. She denies any dysuria, hematuria, hematochezia, melena.  Cardiology and pulmonary were consulted to assist with management.  She was started on IV lasix and steroid with slow clinical improvement.  Discharge Diagnoses:  Acute on chronic respiratory failure with hypoxia -due to COPD exacerbationand CHF exacerbation -usually on 4L trach collar at home -currently on 8L trach collar (40% FiO2)--Dr. Melvyn Novas recommends d/c home with humidified trach collar -wean oxygen as tolerated back to  baseline -personally reviewed CXR--increase interstitial markings  Acute on chronic combined CHF -dry weight 206-207 lbs -appreciate cardiology consult -continue IV lasix 80 mg bid>>once daily -d/c home with torsemide 80 mg bid per cardiology and metolazone 5 mg Mon & Thurs -continue spiro per cards -04/20/2020 echo EF 40-45%, grade 3 DD -07/02/20 echo EF 50%, global HK, indeterminant diastolic -04/06/18 Echo EF 14-78%, global HK -NEG 10.3L for the admission -d/c weight 208  COPD Exacerbation -continueprednisone--finished course during hospitalization -continue duonebs -pulmonary consult appreciated  Coronary Artery Disease -CAD (s/p CABG in 2007, DES to SVG-PDA in 10/2015, cath in 03/2018 showing patent LIMA-LAD and total occlusion of SVG-RCA and RCA territory supplied by collaterals from LAD and LCx  Hypokalemia -replete -check mag--1.9  CKD stage IIIb -Baseline creatinine 1.2-1.5 -serum creatinine peaked 1.76 at [facility] on 11/25 -monitor with diuresis -serum creatinine 1.45 on day of d/c  Atrial flutter -Remain on telemetry -currently in sinus -continue digoxin  Lupus anticoagulant syndrome -coumadin with pharmacy assistance  Coronary artery disease -No chest pain presently  Uncontrolled diabetes mellitus type 2 with hyperglycemia -decreaselantus to 14units bid -NovoLog sliding scale -06/16/20-hemoglobin A1c--8.9 -08/01/20 A1c--8.2 -12/05/20 A1C--9.1 -holding Ozempi--restart after d/c  Depression -Continue sertraline and home dose lorazepam  Hyponatremia -due to liver cirrhosisand CHF  Hypothyroidism -continue levothyroxine   Discharge Instructions   Allergies as of 12/12/2020      Reactions   Penicillins Rash   Has patient had a PCN reaction causing immediate rash, facial/tongue/throat swelling, SOB or lightheadedness with hypotension: Yes Has patient had a PCN reaction causing severe rash involving mucus membranes or skin necrosis:  No Has patient had a PCN reaction that required hospitalization No Has patient had a PCN reaction occurring within the last 10 years: No If all of the above answers are "NO", then may proceed with  Cephalosporin use. REACTION: rash Pt has tolerated cefepime in the past.   Ciprofloxacin    nausea   Ibuprofen Rash   swelling in leg      Medication List    TAKE these medications   albuterol (2.5 MG/3ML) 0.083% nebulizer solution Commonly known as: PROVENTIL Take 3 mLs (2.5 mg total) by nebulization every 4 (four) hours as needed for wheezing. What changed: Another medication with the same name was removed. Continue taking this medication, and follow the directions you see here.   atorvastatin 20 MG tablet Commonly known as: LIPITOR Take 20 mg by mouth daily.   diazepam 5 MG tablet Commonly known as: VALIUM Take 5 mg by mouth daily as needed.   digoxin 0.125 MG tablet Commonly known as: LANOXIN Take 1 tablet (125 mcg total) by mouth daily. Started 10/31/2020   gabapentin 300 MG capsule Commonly known as: NEURONTIN Take 3 capsules (900 mg total) by mouth at bedtime.   levothyroxine 50 MCG tablet Commonly known as: SYNTHROID Take 1 tablet by mouth daily.   LORazepam 0.5 MG tablet Commonly known as: ATIVAN Take 0.5 mg by mouth as needed for anxiety.   metolazone 5 MG tablet Commonly known as: ZAROXOLYN Take 1 tablet (5 mg total) by mouth 2 (two) times a week. Start taking on: December 15, 2020 What changed:   when to take this  additional instructions   metoprolol succinate 25 MG 24 hr tablet Commonly known as: TOPROL-XL Take 25 mg by mouth daily.   mometasone 0.1 % ointment Commonly known as: ELOCON Apply 1 application topically daily as needed.   nitroGLYCERIN 0.4 MG SL tablet Commonly known as: NITROSTAT Place 0.4 mg under the tongue every 5 (five) minutes as needed for chest pain.   NovoLOG FlexPen 100 UNIT/ML FlexPen Generic drug: insulin aspart Inject  1-10 Units into the skin 3 (three) times daily before meals.   pantoprazole 20 MG tablet Commonly known as: PROTONIX Take 20 mg by mouth daily.   polyethylene glycol 17 g packet Commonly known as: MIRALAX / GLYCOLAX Take 17 g by mouth daily as needed for mild constipation.   potassium chloride SA 20 MEQ tablet Commonly known as: KLOR-CON Take 3 tablets (60 mEq total) by mouth daily. Start taking on: December 13, 2020 What changed: how much to take   sertraline 50 MG tablet Commonly known as: ZOLOFT Take 50 mg by mouth at bedtime.   spironolactone 25 MG tablet Commonly known as: ALDACTONE Take 1 tablet (25 mg total) by mouth 2 (two) times daily.   tamsulosin 0.4 MG Caps capsule Commonly known as: FLOMAX Take 0.4 mg by mouth daily.   Torsemide 40 MG Tabs Take 80 mg by mouth 2 (two) times daily. What changed:   medication strength  how much to take  when to take this  additional instructions   traMADol 50 MG tablet Commonly known as: ULTRAM Take 1 tablet (50 mg total) by mouth 3 (three) times daily as needed for moderate pain.   Tyler Aas FlexTouch 100 UNIT/ML FlexTouch Pen Generic drug: insulin degludec Inject 30 Units into the skin at bedtime. What changed: how much to take   warfarin 2.5 MG tablet Commonly known as: COUMADIN Take 0.5-1 tablets (1.25-2.5 mg total) by mouth See admin instructions. 2.5mg  MWF and 1.25mg  every other day. What changed:   how much to take  additional instructions       Follow-up Information    Verta Ellen., NP Follow up on 12/25/2020.  Specialty: Cardiology Why: [facility] Follow-up on 12/25/2020 at 2:30 PM.  Contact information: [place] [address] [phone]              Allergies  Allergen Reactions  . Penicillins Rash    Has patient had a PCN reaction causing immediate rash, facial/tongue/throat swelling, SOB or lightheadedness with hypotension: Yes Has patient had a PCN  reaction causing severe rash involving mucus membranes or skin necrosis: No Has patient had a PCN reaction that required hospitalization No Has patient had a PCN reaction occurring within the last 10 years: No If all of the above answers are "NO", then may proceed with Cephalosporin use.   REACTION: rash Pt has tolerated cefepime in the past.  . Ciprofloxacin     nausea  . Ibuprofen Rash    swelling in leg     Consultations:  pulm  cardiology   Procedures/Studies: DG Chest 1 View  Result Date: 12/05/2020 CLINICAL DATA:  55 year old female with shortness of breath. EXAM: CHEST  1 VIEW COMPARISON:  Chest radiograph dated 10/09/2020. FINDINGS: Tracheostomy with tip above the carina. There is cardiomegaly with vascular congestion. Pneumonia is not excluded. Overall improved congestion compared to the prior radiograph. No focal consolidation or pneumothorax. No large pleural effusion. Median sternotomy wires. No acute osseous pathology. IMPRESSION: Cardiomegaly with vascular congestion. Electronically Signed   By: Anner Crete M.D.   On: 12/05/2020 17:32   DG CHEST PORT 1 VIEW  Result Date: 12/07/2020 CLINICAL DATA:  Tracheostomy, dyspnea, CHF EXAM: PORTABLE CHEST 1 VIEW COMPARISON:  Chest radiograph from one day prior. FINDINGS: Tracheostomy tube tip overlies the tracheal air column at the thoracic inlet. Intact sternotomy wires. Stable cardiomediastinal silhouette with moderate cardiomegaly. No pneumothorax. Probable trace bilateral pleural effusions. Suture line at the right costophrenic angle again noted. Mild pulmonary edema. No acute consolidative airspace disease. IMPRESSION: 1. Mild congestive heart failure. 2. Probable trace bilateral pleural effusions. Electronically Signed   By: Ilona Sorrel M.D.   On: 12/07/2020 10:15   DG CHEST PORT 1 VIEW  Result Date: 12/06/2020 CLINICAL DATA:  Shortness of breath. EXAM: PORTABLE CHEST 1 VIEW COMPARISON:  12/05/2020 FINDINGS: Tracheostomy  tube appears in adequate position. Sternotomy wires unchanged. Lordotic technique is demonstrated as lungs are adequately inflated without lobar consolidation or effusion. Surgical suture line over the right base unchanged. Mild stable prominence of the pulmonary vasculature which may indicate a mild degree of vascular congestion. Stable cardiomegaly. Remainder of the exam is unchanged. IMPRESSION: Stable cardiomegaly with suggestion of mild vascular congestion. Electronically Signed   By: Marin Olp M.D.   On: 12/06/2020 08:19   ECHOCARDIOGRAM LIMITED  Result Date: 12/06/2020    ECHOCARDIOGRAM LIMITED REPORT   Patient Name:   Theresa [name] Date of Exam: 12/06/2020 Medical Rec #:  [id number]       Height:       58.0 in Accession #:    [id number]      Weight:       221.3 lb Date of Birth:  02-19-1966       BSA:          1.901 m Patient Age:    89 years        BP:           125/47 mmHg Patient Gender: F               HR:  70 bpm. Exam Location:  [facility] Procedure: Limited Echo Indications:    Abnormal ECG R94.31  History:        Patient has prior history of Echocardiogram examinations, most                 recent 07/02/2020. CAD, COPD, Signs/Symptoms:Dyspnea; Risk                 Factors:Diabetes, Hypertension, Dyslipidemia and Current Smoker.  Sonographer:    Leavy Cella RDCS (AE) Referring Phys: [id number] IMPRESSIONS  1. Left ventricular ejection fraction, by estimation, is 45 to 50%. The left ventricle has mildly decreased function. The left ventricle demonstrates global hypokinesis. There is mild left ventricular hypertrophy. Left ventricular diastolic parameters were normal.  2. Right ventricular systolic function is normal. The right ventricular size is normal. There is moderately elevated pulmonary artery systolic pressure. The estimated right ventricular systolic pressure is 70.1 mmHg.  3. The mitral valve is normal in structure. No evidence of mitral valve regurgitation. No  evidence of mitral stenosis.  4. The aortic valve is normal in structure. Aortic valve regurgitation is not visualized. No aortic stenosis is present.  5. The inferior vena cava is normal in size with greater than 50% respiratory variability, suggesting right atrial pressure of 3 mmHg. Comparison(s): No significant change from prior study. Prior images reviewed side by side. FINDINGS  Left Ventricle: Left ventricular ejection fraction, by estimation, is 45 to 50%. The left ventricle has mildly decreased function. The left ventricle demonstrates global hypokinesis. Definity contrast agent was given IV to delineate the left ventricular  endocardial borders. The left ventricular internal cavity size was normal in size. There is mild left ventricular hypertrophy. Left ventricular diastolic parameters were normal.  LV Wall Scoring: The mid inferoseptal segment and apical septal segment are akinetic. Right Ventricle: The right ventricular size is normal. No increase in right ventricular wall thickness. Right ventricular systolic function is normal. There is moderately elevated pulmonary artery systolic pressure. The tricuspid regurgitant velocity is 3.50 m/s, and with an assumed right atrial pressure of 3 mmHg, the estimated right ventricular systolic pressure is 77.9 mmHg. Left Atrium: Left atrial size was normal in size. Right Atrium: Right atrial size was normal in size. Pericardium: There is no evidence of pericardial effusion. Mitral Valve: The mitral valve is normal in structure. No evidence of mitral valve stenosis. Tricuspid Valve: The tricuspid valve is normal in structure. Tricuspid valve regurgitation is mild . No evidence of tricuspid stenosis. Aortic Valve: The aortic valve is normal in structure. Aortic valve regurgitation is not visualized. No aortic stenosis is present. Pulmonic Valve: The pulmonic valve was normal in structure. Pulmonic valve regurgitation is mild. No evidence of pulmonic stenosis. Aorta:  The aortic root is normal in size and structure. Venous: The inferior vena cava is normal in size with greater than 50% respiratory variability, suggesting right atrial pressure of 3 mmHg. IAS/Shunts: No atrial level shunt detected by color flow Doppler. LEFT VENTRICLE PLAX 2D LVIDd:         4.19 cm  Diastology LVIDs:         3.10 cm  LV e' medial:    3.98 cm/s LV PW:         1.52 cm  LV E/e' medial:  16.2 LV IVS:        1.29 cm  LV e' lateral:   7.20 cm/s LVOT diam:     1.80 cm  LV E/e' lateral:  8.9 LVOT Area:     2.54 cm  LEFT ATRIUM         Index LA diam:    4.30 cm 2.26 cm/m   AORTA Ao Root diam: 3.00 cm MITRAL VALVE               TRICUSPID VALVE MV Area (PHT): 2.91 cm    TR Peak grad:   49.0 mmHg MV Decel Time: 261 msec    TR Vmax:        350.00 cm/s MV E velocity: 64.30 cm/s MV A velocity: 47.10 cm/s  SHUNTS MV E/A ratio:  1.37        Systemic Diam: 1.80 cm Candee Furbish MD Electronically signed by Candee Furbish MD Signature Date/Time: 12/06/2020/1:27:33 PM    Final          Discharge Exam: Vitals:   12/12/20 0526 12/12/20 0807  BP: (!) 99/47   Pulse: (!) 54   Resp: 20   Temp: 98.4 F (36.9 C)   SpO2: 92% 92%   Vitals:   12/11/20 2332 12/12/20 0500 12/12/20 0526 12/12/20 0807  BP:   (!) 99/47   Pulse:   (!) 54   Resp:   20   Temp:   98.4 F (36.9 C)   TempSrc:   Oral   SpO2: (!) 85%  92% 92%  Weight:  94.7 kg    Height:        General: Pt is alert, awake, not in acute distress Cardiovascular: RRR, S1/S2 +, no rubs, no gallops Respiratory: bibasilar rales. No wheeze.  Diminished BS bilateral Abdominal: Soft, NT, ND, bowel sounds + Extremities: trace LE edema, no cyanosis   The results of significant diagnostics from this hospitalization (including imaging, microbiology, ancillary and laboratory) are listed below for reference.    Significant Diagnostic Studies: DG Chest 1 View  Result Date: 12/05/2020 CLINICAL DATA:  55 year old female with shortness of breath. EXAM: CHEST   1 VIEW COMPARISON:  Chest radiograph dated 10/09/2020. FINDINGS: Tracheostomy with tip above the carina. There is cardiomegaly with vascular congestion. Pneumonia is not excluded. Overall improved congestion compared to the prior radiograph. No focal consolidation or pneumothorax. No large pleural effusion. Median sternotomy wires. No acute osseous pathology. IMPRESSION: Cardiomegaly with vascular congestion. Electronically Signed   By: Anner Crete M.D.   On: 12/05/2020 17:32   DG CHEST PORT 1 VIEW  Result Date: 12/07/2020 CLINICAL DATA:  Tracheostomy, dyspnea, CHF EXAM: PORTABLE CHEST 1 VIEW COMPARISON:  Chest radiograph from one day prior. FINDINGS: Tracheostomy tube tip overlies the tracheal air column at the thoracic inlet. Intact sternotomy wires. Stable cardiomediastinal silhouette with moderate cardiomegaly. No pneumothorax. Probable trace bilateral pleural effusions. Suture line at the right costophrenic angle again noted. Mild pulmonary edema. No acute consolidative airspace disease. IMPRESSION: 1. Mild congestive heart failure. 2. Probable trace bilateral pleural effusions. Electronically Signed   By: Ilona Sorrel M.D.   On: 12/07/2020 10:15   DG CHEST PORT 1 VIEW  Result Date: 12/06/2020 CLINICAL DATA:  Shortness of breath. EXAM: PORTABLE CHEST 1 VIEW COMPARISON:  12/05/2020 FINDINGS: Tracheostomy tube appears in adequate position. Sternotomy wires unchanged. Lordotic technique is demonstrated as lungs are adequately inflated without lobar consolidation or effusion. Surgical suture line over the right base unchanged. Mild stable prominence of the pulmonary vasculature which may indicate a mild degree of vascular congestion. Stable cardiomegaly. Remainder of the exam is unchanged. IMPRESSION: Stable cardiomegaly with suggestion of mild vascular congestion. Electronically Signed  By: Marin Olp M.D.   On: 12/06/2020 08:19   ECHOCARDIOGRAM LIMITED  Result Date: 12/06/2020    ECHOCARDIOGRAM  LIMITED REPORT   Patient Name:   ARTEMIS KOLLER Date of Exam: 12/06/2020 Medical Rec #:  [id number]       Height:       58.0 in Accession #:    [id number]      Weight:       221.3 lb Date of Birth:  [birth year]       BSA:          1.901 m Patient Age:    84 years        BP:           125/47 mmHg Patient Gender: F               HR:           70 bpm. Exam Location:  [facility] Procedure: Limited Echo Indications:    Abnormal ECG R94.31  History:        Patient has prior history of Echocardiogram examinations, most                 recent 07/02/2020. CAD, COPD, Signs/Symptoms:Dyspnea; Risk                 Factors:Diabetes, Hypertension, Dyslipidemia and Current Smoker.  Sonographer:    Leavy Cella RDCS (AE) Referring Phys: [id number] [address] S [place] IMPRESSIONS  1. Left ventricular ejection fraction, by estimation, is 45 to 50%. The left ventricle has mildly decreased function. The left ventricle demonstrates global hypokinesis. There is mild left ventricular hypertrophy. Left ventricular diastolic parameters were normal.  2. Right ventricular systolic function is normal. The right ventricular size is normal. There is moderately elevated pulmonary artery systolic pressure. The estimated right ventricular systolic pressure is 57.2 mmHg.  3. The mitral valve is normal in structure. No evidence of mitral valve regurgitation. No evidence of mitral stenosis.  4. The aortic valve is normal in structure. Aortic valve regurgitation is not visualized. No aortic stenosis is present.  5. The inferior vena cava is normal in size with greater than 50% respiratory variability, suggesting right atrial pressure of 3 mmHg. Comparison(s): No significant change from prior study. Prior images reviewed side by side. FINDINGS  Left Ventricle: Left ventricular ejection fraction, by estimation, is 45 to 50%. The left ventricle has mildly decreased function. The left ventricle demonstrates global hypokinesis. Definity contrast agent was given IV  to delineate the left ventricular  endocardial borders. The left ventricular internal cavity size was normal in size. There is mild left ventricular hypertrophy. Left ventricular diastolic parameters were normal.  LV Wall Scoring: The mid inferoseptal segment and apical septal segment are akinetic. Right Ventricle: The right ventricular size is normal. No increase in right ventricular wall thickness. Right ventricular systolic function is normal. There is moderately elevated pulmonary artery systolic pressure. The tricuspid regurgitant velocity is 3.50 m/s, and with an assumed right atrial pressure of 3 mmHg, the estimated right ventricular systolic pressure is 62.0 mmHg. Left Atrium: Left atrial size was normal in size. Right Atrium: Right atrial size was normal in size. Pericardium: There is no evidence of pericardial effusion. Mitral Valve: The mitral valve is normal in structure. No evidence of mitral valve stenosis. Tricuspid Valve: The tricuspid valve is normal in structure. Tricuspid valve regurgitation is mild . No evidence of tricuspid stenosis. Aortic Valve: The aortic valve is normal in structure. Aortic valve regurgitation  is not visualized. No aortic stenosis is present. Pulmonic Valve: The pulmonic valve was normal in structure. Pulmonic valve regurgitation is mild. No evidence of pulmonic stenosis. Aorta: The aortic root is normal in size and structure. Venous: The inferior vena cava is normal in size with greater than 50% respiratory variability, suggesting right atrial pressure of 3 mmHg. IAS/Shunts: No atrial level shunt detected by color flow Doppler. LEFT VENTRICLE PLAX 2D LVIDd:         4.19 cm  Diastology LVIDs:         3.10 cm  LV e' medial:    3.98 cm/s LV PW:         1.52 cm  LV E/e' medial:  16.2 LV IVS:        1.29 cm  LV e' lateral:   7.20 cm/s LVOT diam:     1.80 cm  LV E/e' lateral: 8.9 LVOT Area:     2.54 cm  LEFT ATRIUM         Index LA diam:    4.30 cm 2.26 cm/m   AORTA Ao Root  diam: 3.00 cm MITRAL VALVE               TRICUSPID VALVE MV Area (PHT): 2.91 cm    TR Peak grad:   49.0 mmHg MV Decel Time: 261 msec    TR Vmax:        350.00 cm/s MV E velocity: 64.30 cm/s MV A velocity: 47.10 cm/s  SHUNTS MV E/A ratio:  1.37        Systemic Diam: 1.80 cm Candee Furbish MD Electronically signed by Candee Furbish MD Signature Date/Time: 12/06/2020/1:27:33 PM    Final      Microbiology: Recent Results (from the past 240 hour(s))  Resp Panel by RT-PCR (Flu A&B, Covid) Nasopharyngeal Swab     Status: None   Collection Time: 12/05/20  5:58 PM   Specimen: Nasopharyngeal Swab; Nasopharyngeal(NP) swabs in vial transport medium  Result Value Ref Range Status   SARS Coronavirus 2 by RT PCR NEGATIVE NEGATIVE Final    Comment: (NOTE) SARS-CoV-2 target nucleic acids are NOT DETECTED.  The SARS-CoV-2 RNA is generally detectable in upper respiratory specimens during the acute phase of infection. The lowest concentration of SARS-CoV-2 viral copies this assay can detect is 138 copies/mL. A negative result does not preclude SARS-Cov-2 infection and should not be used as the sole basis for treatment or other patient management decisions. A negative result may occur with  improper specimen collection/handling, submission of specimen other than nasopharyngeal swab, presence of viral mutation(s) within the areas targeted by this assay, and inadequate number of viral copies(<138 copies/mL). A negative result must be combined with clinical observations, patient history, and epidemiological information. The expected result is Negative.  Fact Sheet for Patients:  EntrepreneurPulse.com.au  Fact Sheet for Healthcare Providers:  IncredibleEmployment.be  This test is no t yet approved or cleared by the [place] FDA and  has been authorized for detection and/or diagnosis of SARS-CoV-2 by FDA under an Emergency Use Authorization (EUA). This EUA will remain  in  effect (meaning this test can be used) for the duration of the COVID-19 declaration under Section 564(b)(1) of the Act, 21 U.S.C.section 360bbb-3(b)(1), unless the authorization is terminated  or revoked sooner.       Influenza A by PCR NEGATIVE NEGATIVE Final   Influenza B by PCR NEGATIVE NEGATIVE Final    Comment: (NOTE) The Xpert Xpress SARS-CoV-2/FLU/RSV plus assay is intended  as an aid in the diagnosis of influenza from Nasopharyngeal swab specimens and should not be used as a sole basis for treatment. Nasal washings and aspirates are unacceptable for Xpert Xpress SARS-CoV-2/FLU/RSV testing.  Fact Sheet for Patients: EntrepreneurPulse.com.au  Fact Sheet for Healthcare Providers: IncredibleEmployment.be  This test is not yet approved or cleared by the [place] FDA and has been authorized for detection and/or diagnosis of SARS-CoV-2 by FDA under an Emergency Use Authorization (EUA). This EUA will remain in effect (meaning this test can be used) for the duration of the COVID-19 declaration under Section 564(b)(1) of the Act, 21 U.S.C. section 360bbb-3(b)(1), unless the authorization is terminated or revoked.  Performed at [facility], [address] [place], [place] [address]   MRSA PCR Screening     Status: None   Collection Time: 12/06/20 12:40 AM   Specimen: Nasal Mucosa; Nasopharyngeal  Result Value Ref Range Status   MRSA by PCR NEGATIVE NEGATIVE Final    Comment:        The GeneXpert MRSA Assay (FDA approved for NASAL specimens only), is one component of a comprehensive MRSA colonization surveillance program. It is not intended to diagnose MRSA infection nor to guide or monitor treatment for MRSA infections. Performed at [facility], [address], [place], [place] [address]      Labs: Basic Metabolic Panel: Recent Labs  Lab 12/08/20 0313 12/09/20 0628 12/10/20 0712 12/11/20 0606 12/12/20 0514  NA 139 140  140 142 141  K 3.1* 3.7 3.3* 3.9 3.3*  CL 87* 83* 81* 78* 80*  CO2 39* 44* 43* 49* 45*  GLUCOSE 52* 57* 76 75 69*  BUN 41* 42* 42* 46* 45*  CREATININE 1.49* 1.45* 1.30* 1.55* 1.45*  CALCIUM 8.4* 8.5* 8.4* 9.1 9.0  MG  --   --   --  1.9  --    Liver Function Tests: Recent Labs  Lab 12/05/20 1714 12/06/20 0451 12/07/20 0514 12/08/20 0313 12/09/20 0628  AST 14* 11* 11* 13* 15  ALT 23 21 19 20 19   ALKPHOS 133* 122 102 95 92  BILITOT 0.8 0.9 0.8 0.7 1.2  PROT 6.9 6.4* 6.4* 6.3* 6.5  ALBUMIN 2.8* 2.6* 2.6* 2.7* 2.9*   Recent Labs  Lab 12/05/20 1714  LIPASE 27   No results for input(s): AMMONIA in the last 168 hours. CBC: Recent Labs  Lab 12/05/20 1714 12/06/20 0451 12/08/20 0313 12/09/20 8657 12/10/20 0712 12/11/20 0606 12/12/20 0514  WBC 8.0   < > 14.0* 10.9* 8.7 10.3 11.7*  NEUTROABS 6.0  --   --   --   --   --   --   HGB 13.4   < > 12.8 13.8 13.2 13.7 14.6  HCT 43.8   < > 42.0 45.3 43.1 46.0 48.4*  MCV 95.6   < > 95.7 97.0 95.4 97.0 96.4  PLT 220   < > 269 255 230 264 283   < > = values in this interval not displayed.   Cardiac Enzymes: No results for input(s): CKTOTAL, CKMB, CKMBINDEX, TROPONINI in the last 168 hours. BNP: Invalid input(s): POCBNP CBG: Recent Labs  Lab 12/11/20 1117 12/11/20 1604 12/11/20 2122 12/12/20 0720 12/12/20 1102  GLUCAP 275* 189* 117* 68* 144*    Time coordinating discharge:  36 minutes  Signed:  Orson Eva, DO [facility] Pager: [phone] 12/12/2020, 11:18 AM

## 2020-12-12 NOTE — TOC Transition Note (Addendum)
Transition of Care [facility]) - CM/SW Discharge Note   Patient Details  Name: Theresa [name] MRN: [id number] Date of Birth: [birth year]  Transition of Care [facility]) CM/SW Contact:  Natasha Bence, LCSW Phone Number: 12/12/2020, 12:37 PM   Clinical Narrative:    CSW notified of patient's need for trach collar and oxygen. CSW contacted [employer] who stated that they would not be able to provide supplies due to billing and ability to provide it on 04/08. CSW referred patient to [facility] agreeable to provide DME to patient. CSW notified Vaughan Basta with [employer] of patient's discharge. Vaughan Basta agreeable to provide services upon discharge. TOC signing off.   Addendum 3:30 pm Post discharge, Ashly with [employer] contacted CSW to notify CSW that they are not able to provide O2 due to patient's insurance not allowing them to switch companies. CSW contacted [employer] on call who reported that the patient had an active card on file and owed several hundred dollars. [employer] on call reported that the husband provided updated card information but they would still not provide equipment until the balance is paid. CSW requested further inquiry into identifying how patient's equipment could be supplied. [employer] on call reported that she would see if her manager could over ride it but she wouldn't place the call until another 30 plus minutes. CSW contacted supervisor Andreas Newport who contacted Zach with [employer]. Zach contacted CSW and is agreeable to supply equipment for patient and will have patient follow up with PCP for additional needs. TOC signing off.  Final next level of care: Skilled Nursing Facility Barriers to Discharge: Barriers Resolved   Patient Goals and CMS Choice Patient states their goals for this hospitalization and ongoing recovery are:: return home with [facility] CMS Medicare.gov Compare Post Acute Care list provided to:: Patient Choice offered to / list presented to : Patient  Discharge Placement                     Patient and family notified of of transfer: 12/12/20  Discharge Plan and Services     Post Acute Care Choice: Home Health          DME Arranged: Trach supplies DME Agency: [employer] Date DME Agency Contacted: 12/12/20 Time DME Agency Contacted: 79 Representative spoke with at DME Agency: [place]: RN,PT [place] Agency: [place] ([place]) Date [place]: 12/12/20 Time [place]: 1237 Representative spoke with at [place]: [place] ([place]) Interventions     Readmission Risk Interventions Readmission Risk Prevention Plan 12/10/2020 07/22/2020 05/17/2019  Transportation Screening Complete Complete Complete  PCP or Specialist Appt within 3-5 Days - - Complete  HRI or [place] - - Complete  Social Work Consult for [place] Planning/Counseling - - Complete  Palliative Care Screening - - Not Applicable  Medication Review Press photographer) Complete Complete Complete  HRI or Home Care Consult Complete - -  SW Recovery Care/Counseling Consult Complete - -  Palliative Care Screening Not Applicable Not Complete -  [place] Not Applicable Not Applicable -  Some recent data might be hidden

## 2020-12-12 NOTE — Progress Notes (Unsigned)
#  4 uncuffed tracheostomy to be delivered via DME to the pt's home after discharge from the hospital

## 2020-12-12 NOTE — Progress Notes (Signed)
NAME:  Theresa [name], MRN:  [id number], DOB:  April 22, 1966, LOS: 7 ADMISSION DATE:  12/05/2020, CONSULTATION DATE:  4/5 REFERRING MD:  Shahmehdi   CHIEF COMPLAINT:  Resp failure acute on chronic   Brief patient profile:  55 yowf  MO/ smoker  Trach/02dep since 2002 p ? 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 female, with systolic and diastolic CHF, COPD, continued tobacco abuse, diabetes mellitus type 2, lupus anticoagulant syndrome, coronary artery disease with history of STEMI, hypertension, chronic respiratory failure on 4 L, chronic tracheostomy (since 2002). -Patient presents today secondary to complaints of shortness of breath, over the last week, she reports worsening dyspnea, leg swelling, per EMS she was 70% on her baseline 4 L nasal cannula, in ED she was 82% on 4 L, she is currently requiring ATC at 28% (10 L), denies fever, chills, chest pain, abdominal pain, no dysuria or polyuria, reports she has been taking torsemide as instructed, reports she has been compliant with her low-salt diet as well, . -In ED her chest x-ray significant cardiomegaly with vascular congestion, her BNP elevated at 195, glucose elevated at 314, she was wheezing, dyspnea has improved with IV Lasix and IV Solu-Medrol, [facility] consulted to admit.2  Pertinent  Medical History  Acute kidney failure with lesion of tubular necrosis Acute systolic heart failure CAD Cervical cancer Chronic diastolic CHF (congestive heart failure)  Chronic respiratory failure ([place]),  COPD   Diabetes mellitus DVT (deep venous thrombosis)  Lupus anticoagulant disorder  Endometriosis History of gallstones  HTN  Hyperlipidemia Morbid obesity Tracheostomy in place chronic since 2002 (11/03/2015).    Significant Hospital Events: Including procedures, antibiotic start  and stop dates in addition to other pertinent events   . Echo 4/2 1. Left ventricular ejection fraction, by estimation, is 45 to 50%. The  left ventricle has mildly decreased function. The left ventricle  demonstrates global hypokinesis. There is mild left ventricular  hypertrophy. Left ventricular diastolic parameters were normal.  2. Right ventricular systolic function is normal. The right ventricular  size is normal. There is moderately elevated pulmonary artery systolic  pressure. The estimated right ventricular systolic pressure is 60.1 mmHg.  3. The mitral valve is normal in structure. No evidence of mitral valve  regurgitation. No evidence of mitral stenosis.  4. The aortic valve is normal in structure. Aortic valve regurgitation is  not visualized. No aortic stenosis is present.  5. The inferior vena cava is normal in size with greater than 50%  respiratory variability, suggesting right atrial pressure of 3 mmHg.  Marland Kitchen Resp culture (trach) 4/5 >>> not done . 4/7  try just trach collar HS > slept fine     Scheduled Meds: . acidophilus  1 capsule Oral TID  . atorvastatin  20 mg Oral Daily  . azithromycin  500 mg Oral Daily  . Chlorhexidine Gluconate Cloth  6 each Topical Daily  . digoxin  125 mcg Oral Daily  . gabapentin  600 mg Oral QHS  . guaiFENesin-dextromethorphan  10 mL Oral Q8H  . insulin aspart  0-9 Units Subcutaneous TID WC  . insulin aspart  2 Units Subcutaneous TID WC  . insulin glargine  14 Units Subcutaneous BID  . ipratropium-albuterol  3 mL Nebulization TID  . levothyroxine  100 mcg Oral Q0600  .  metoprolol succinate  25 mg Oral Daily  . mometasone  1 application Topical Daily  . pantoprazole  40 mg Oral Daily  . potassium chloride SA  40 mEq Oral Daily  . sertraline  50 mg Oral QHS  . spironolactone  25 mg Oral BID  . tamsulosin  0.4 mg Oral Daily  . warfarin  2 mg Oral ONCE-1600  . Warfarin - Pharmacist Dosing Inpatient   Does not apply q1600    Continuous Infusions: PRN Meds:.acetaminophen **OR** acetaminophen, albuterol, LORazepam, nitroGLYCERIN, polyethylene glycol, traMADol     Interim History / Subjective:  All smiles, slept fine on T collar @ 40%   s NP   Objective   Blood pressure (!) 99/47, pulse (!) 54, temperature 98.4 F (36.9 C), temperature source Oral, resp. rate 20, height 4\' 11"  (1.499 m), weight 94.7 kg, SpO2 92 %.    FiO2 (%):  [35 %-40 %] 40 %   Intake/Output Summary (Last 24 hours) at 12/12/2020 0834 Last data filed at 12/12/2020 0700 Gross per 24 hour  Intake 840 ml  Output 1250 ml  Net -410 ml   Filed Weights   12/10/20 1840 12/11/20 0500 12/12/20 0500  Weight: 94.5 kg 94.6 kg 94.7 kg    Examination: Tmax  98.4 General appearance:    All smiles, no increaed wob at 45 degres hob  At Rest 02 sats  92% on 0.40 t collar  No jvd Oropharynx clear,  mucosa nl Neck supple/ metal trach in place with T collar  Lungs with minimial  scattered exp > insp rhonchi bilaterally RRR no s3 or or sign murmur Abd obese with limited xcursion  Extr warm with no edema or clubbing noted Neuro  Sensorium intact,  no apparent motor deficits        Assessment & Plan:    1) chronic hypoxemic and hypercarbic resp failure secondary to OHS/ VC paresis  with unusual set up where she can't breathe comfortably with trach plugged  But only uses nasal 02 so this leaves trach open 24/7 and subject to mucus plugs and entrainment of RA > desats > decmpensation  - BNP down to 279  Am 4/8, probably as good as she's going to get @ wt 94.7 kg  >>>  To T collar @ 40% with humdity if possible for home use - if can't add humidity then she should just get a humifier for her bedroom  I gave her my card for outpt f/u prn   2) Biventricular chf with tendency to anasarca refractory to demadex/zaroxylyn ? Adherence?  >>> 4/5 added aldactone 25 mg bid to offset physiologic hyperadrenalism from diuretic rx >>> rec continue aldactone but  monitor bmet serially as you plan as well as daily wts    3) Morbid obesity / hypothyroidism Lab Results  Component Value Date   TSH 7.254 (H) 12/10/2020   >>> doubled dose of synthroid 4/6 wto 100 mcg daily with goal of < 2.0 TSH in this setting     Labs   CBC: Recent Labs  Lab 12/05/20 1714 12/06/20 0451 12/08/20 0313 12/09/20 2725 12/10/20 0712 12/11/20 0606 12/12/20 0514  WBC 8.0   < > 14.0* 10.9* 8.7 10.3 11.7*  NEUTROABS 6.0  --   --   --   --   --   --   HGB 13.4   < > 12.8 13.8 13.2 13.7 14.6  HCT 43.8   < > 42.0 45.3 43.1 46.0 48.4*  MCV 95.6   < >  95.7 97.0 95.4 97.0 96.4  PLT 220   < > 269 255 230 264 283   < > = values in this interval not displayed.    Basic Metabolic Panel: Recent Labs  Lab 12/08/20 0313 12/09/20 0628 12/10/20 0712 12/11/20 0606 12/12/20 0514  NA 139 140 140 142 141  K 3.1* 3.7 3.3* 3.9 3.3*  CL 87* 83* 81* 78* 80*  CO2 39* 44* 43* 49* 45*  GLUCOSE 52* 57* 76 75 69*  BUN 41* 42* 42* 46* 45*  CREATININE 1.49* 1.45* 1.30* 1.55* 1.45*  CALCIUM 8.4* 8.5* 8.4* 9.1 9.0  MG  --   --   --  1.9  --    GFR: Estimated Creatinine Clearance: 44.2 mL/min (A) (by C-G formula based on SCr of 1.45 mg/dL (H)). Recent Labs  Lab 12/07/20 0736 12/07/20 1029 12/08/20 0313 12/09/20 0628 12/10/20 0712 12/11/20 0606 12/12/20 0514  WBC  --   --    < > 10.9* 8.7 10.3 11.7*  LATICACIDVEN 2.2* 4.0*  --   --   --   --   --    < > = values in this interval not displayed.    Liver Function Tests: Recent Labs  Lab 12/05/20 1714 12/06/20 0451 12/07/20 0514 12/08/20 0313 12/09/20 0628  AST 14* 11* 11* 13* 15  ALT 23 21 19 20 19   ALKPHOS 133* 122 102 95 92  BILITOT 0.8 0.9 0.8 0.7 1.2  PROT 6.9 6.4* 6.4* 6.3* 6.5  ALBUMIN 2.8* 2.6* 2.6* 2.7* 2.9*   Recent Labs  Lab 12/05/20 1714  LIPASE 27   No results for input(s): AMMONIA in the last 168 hours.  ABG    Component Value Date/Time   PHART 7.454 (H) 12/09/2020 0845   PCO2ART 66.6 (HH)  12/09/2020 0845   PO2ART 66.3 (L) 12/09/2020 0845   HCO3 42.5 (H) 12/09/2020 0845   TCO2 30 10/16/2018 1408   ACIDBASEDEF 2.0 10/15/2018 1111   O2SAT 92.1 12/09/2020 0845     Coagulation Profile: Recent Labs  Lab 12/08/20 0313 12/09/20 0628 12/10/20 0712 12/11/20 0606 12/12/20 0514  INR 3.2* 1.8* 1.6* 1.5* 2.0*    Cardiac Enzymes: No results for input(s): CKTOTAL, CKMB, CKMBINDEX, TROPONINI in the last 168 hours.  HbA1C: Hgb A1c MFr Bld  Date/Time Value Ref Range Status  12/05/2020 06:56 PM 9.1 (H) 4.8 - 5.6 % Final    Comment:    (NOTE) Pre diabetes:          5.7%-6.4%  Diabetes:              >6.4%  Glycemic control for   <7.0% adults with diabetes   08/01/2020 12:51 PM 8.2 (H) 4.8 - 5.6 % Final    Comment:    (NOTE) Pre diabetes:          5.7%-6.4%  Diabetes:              >6.4%  Glycemic control for   <7.0% adults with diabetes     CBG: Recent Labs  Lab 12/11/20 0725 12/11/20 1117 12/11/20 1604 12/11/20 2122 12/12/20 0720  GLUCAP 79 275* 189* 117* 68*      Christinia Gully, MD Pulmonary and [place] [phone]   After 7:00 pm call Elink  [phone]

## 2020-12-12 NOTE — Care Management Important Message (Signed)
Important Message  Patient Details  Name: Theresa [name] MRN: [id number] Date of Birth: 28-Aug-1966   Medicare Important Message Given:  Yes     [name] 12/12/2020, 12:39 PM

## 2020-12-12 NOTE — Progress Notes (Addendum)
SATURATION QUALIFICATIONS: (This note is used to comply with regulatory documentation for home oxygen)  Patient Saturations on Room Air at Rest = 80  Patient Saturations on Room Air while Ambulating = NA  Patient Saturations on 40% FiO2  of oxygen while Ambulating = 93  Please briefly explain why patient needs home oxygen: To maintain 02 sat at 90% or above during ambulation.   DTat

## 2020-12-12 NOTE — Progress Notes (Signed)
ANTICOAGULATION CONSULT NOTE -  Pharmacy Consult for Warfarin Indication: VTE Treatment - Continuing PTA  Allergies  Allergen Reactions  . Penicillins Rash    Has patient had a PCN reaction causing immediate rash, facial/tongue/throat swelling, SOB or lightheadedness with hypotension: Yes Has patient had a PCN reaction causing severe rash involving mucus membranes or skin necrosis: No Has patient had a PCN reaction that required hospitalization No Has patient had a PCN reaction occurring within the last 10 years: No If all of the above answers are "NO", then may proceed with Cephalosporin use.   REACTION: rash Pt has tolerated cefepime in the past.  . Ciprofloxacin     nausea  . Ibuprofen Rash    swelling in leg     Patient Measurements: Height: 4\' 11"  (149.9 cm) Weight: 94.7 kg (208 lb 12.4 oz) IBW/kg (Calculated) : 43.2  Vital Signs: Temp: 98.4 F (36.9 C) (04/08 0526) Temp Source: Oral (04/08 0526) BP: 99/47 (04/08 0526) Pulse Rate: 54 (04/08 0526)  Labs: Recent Labs    12/10/20 0712 12/11/20 0606 12/12/20 0514  HGB 13.2 13.7 14.6  HCT 43.1 46.0 48.4*  PLT 230 264 283  LABPROT 18.2* 17.9* 21.5*  INR 1.6* 1.5* 2.0*  CREATININE 1.30* 1.55* 1.45*    Estimated Creatinine Clearance: 44.2 mL/min (A) (by C-G formula based on SCr of 1.45 mg/dL (H)).   Medical History: Past Medical History:  Diagnosis Date  . Acute kidney failure with lesion of tubular necrosis (HCC)   . Acute systolic heart failure ([place])   . CAD (coronary artery disease)    a. s/p prior PCI. b. CABG 2007 at [facility] in [place] 2007. c. inferior STEMI 10/2015 s/p DES to dSVG-PDA.  . Cardiac arrest ([place])   . Cervical cancer ([place])   . Chronic diastolic CHF (congestive heart failure) ([place])   . Chronic respiratory failure ([place])    s/p tracheostomy 2002  . Chronic RUQ pain   . COPD (chronic obstructive pulmonary disease) ([place])   . Diabetes mellitus ([place])   . DVT (deep venous  thrombosis) ([place])   . Endometriosis   . History of gallstones 01/2016   seen on Ultrasound  . History of HIDA scan 11/2016   normal  . HTN (hypertension)   . Hyperlipidemia   . Lupus anticoagulant disorder (HCC)    on coumadin  . Morbid obesity ([place])   . Psoriasis   . ST elevation (STEMI) myocardial infarction involving right coronary artery ([place]) 10/29/15   stent to VG to PDA  . Toe fracture, right 03/29/2018  . Tracheostomy in place [facility]), chronic since 2002 11/03/2015     Assessment: 55 yo female presented on 12/05/2020 with SOB, diarrhea and leg swelling. Patient is on warfarin prior to admission for lupus anticoagulant with history of VTE and Afib.    Prior to admission regimen (per RN who discussed w/ pt): warfarin 2.5mg  daily except 5mg  on Mondays/Fridays. Managed per PCP.     INR 3.2 > 1.8 > 1.6>1.5 > 2.0  Goal of Therapy:  INR 2-3 Monitor platelets by anticoagulation protocol: Yes   Plan:  Warfarin 2 mg po x 1 today Monitor INR, CBC and s/s of bleeding daily  Margot Ables, PharmD Clinical Pharmacist 12/12/2020 8:09 AM

## 2020-12-12 NOTE — TOC Transition Note (Addendum)
Transition of Care [facility]) - CM/SW Discharge Note   Patient Details  Name: Theresa [name] MRN: [id number] Date of Birth: 02/11/1966  Transition of Care [facility]) CM/SW Contact:  Anselm Pancoast, RN Phone Number: 12/12/2020, 3:52 PM   Clinical Narrative:    [facility] supervisor received call from LCSW updating difficulty with getting Oxygen confirmed for patient. Writer outreached to [employer] and spoke with Bethanne Ginger who confirmed [employer] would provide oxygen as needed and outreach to patient to confirm needs met. Confirmed order was complete and no further issues with service.    Final next level of care: Skilled Nursing Facility Barriers to Discharge: Barriers Resolved   Patient Goals and CMS Choice Patient states their goals for this hospitalization and ongoing recovery are:: return home with [facility] CMS Medicare.gov Compare Post Acute Care list provided to:: Patient Choice offered to / list presented to : Patient  Discharge Placement                    Patient and family notified of of transfer: 12/12/20  Discharge Plan and Services     Post Acute Care Choice: Home Health          DME Arranged: Trach supplies DME Agency: [employer] Date DME Agency Contacted: 12/12/20 Time DME Agency Contacted: 17 Representative spoke with at DME Agency: [place]: RN,PT [place] Agency: [place] ([place]) Date [place]: 12/12/20 Time [place]: 1237 Representative spoke with at [place]: [place] ([place]) Interventions     Readmission Risk Interventions Readmission Risk Prevention Plan 12/10/2020 07/22/2020 05/17/2019  Transportation Screening Complete Complete Complete  PCP or Specialist Appt within 3-5 Days - - Complete  HRI or [place] - - Complete  Social Work Consult for [place] Planning/Counseling - - Complete  Palliative Care Screening - - Not Applicable  Medication Review Press photographer) Complete Complete  Complete  HRI or Home Care Consult Complete - -  SW Recovery Care/Counseling Consult Complete - -  Palliative Care Screening Not Applicable Not Complete -  [place] Not Applicable Not Applicable -  Some recent data might be hidden

## 2020-12-12 NOTE — Progress Notes (Addendum)
Progress Note  Patient Name: Theresa [name] Date of Encounter: 12/12/2020  [facility] Cardiologist: [name], MD   Subjective   SOB has improved  Inpatient Medications    Scheduled Meds: . acidophilus  1 capsule Oral TID  . atorvastatin  20 mg Oral Daily  . azithromycin  500 mg Oral Daily  . Chlorhexidine Gluconate Cloth  6 each Topical Daily  . digoxin  125 mcg Oral Daily  . gabapentin  600 mg Oral QHS  . guaiFENesin-dextromethorphan  10 mL Oral Q8H  . insulin aspart  0-9 Units Subcutaneous TID WC  . insulin aspart  2 Units Subcutaneous TID WC  . insulin glargine  14 Units Subcutaneous BID  . ipratropium-albuterol  3 mL Nebulization TID  . levothyroxine  100 mcg Oral Q0600  . metoprolol succinate  25 mg Oral Daily  . mometasone  1 application Topical Daily  . pantoprazole  40 mg Oral Daily  . potassium chloride SA  40 mEq Oral Daily  . sertraline  50 mg Oral QHS  . spironolactone  25 mg Oral BID  . tamsulosin  0.4 mg Oral Daily  . warfarin  2 mg Oral ONCE-1600  . Warfarin - Pharmacist Dosing Inpatient   Does not apply q1600   Continuous Infusions:  PRN Meds: acetaminophen **OR** acetaminophen, albuterol, LORazepam, nitroGLYCERIN, polyethylene glycol, traMADol   Vital Signs    Vitals:   12/11/20 2332 12/12/20 0500 12/12/20 0526 12/12/20 0807  BP:   (!) 99/47   Pulse:   (!) 54   Resp:   20   Temp:   98.4 F (36.9 C)   TempSrc:   Oral   SpO2: (!) 85%  92% 92%  Weight:  94.7 kg    Height:        Intake/Output Summary (Last 24 hours) at 12/12/2020 0932 Last data filed at 12/12/2020 0700 Gross per 24 hour  Intake 840 ml  Output 1250 ml  Net -410 ml   Last 3 Weights 12/12/2020 12/11/2020 12/10/2020  Weight (lbs) 208 lb 12.4 oz 208 lb 8.9 oz 208 lb 5.4 oz  Weight (kg) 94.7 kg 94.6 kg 94.5 kg      Telemetry    SR - Personally Reviewed  ECG    n/a - Personally Reviewed  Physical Exam   GEN: No acute distress.   Neck: No JVD Cardiac: RRR, no  murmurs, rubs, or gallops.  Respiratory: Clear to auscultation bilaterally. GI: Soft, nontender, non-distended  MS: No edema; No deformity. Neuro:  Nonfocal  Psych: Normal affect   Labs    High Sensitivity Troponin:   Recent Labs  Lab 12/05/20 1714 12/05/20 1856  TROPONINIHS 15 14      Chemistry Recent Labs  Lab 12/07/20 0514 12/08/20 0313 12/09/20 0628 12/10/20 0712 12/11/20 0606 12/12/20 0514  NA 137 139 140 140 142 141  K 3.3* 3.1* 3.7 3.3* 3.9 3.3*  CL 87* 87* 83* 81* 78* 80*  CO2 37* 39* 44* 43* 49* 45*  GLUCOSE 227* 52* 57* 76 75 69*  BUN 36* 41* 42* 42* 46* 45*  CREATININE 1.44* 1.49* 1.45* 1.30* 1.55* 1.45*  CALCIUM 8.2* 8.4* 8.5* 8.4* 9.1 9.0  PROT 6.4* 6.3* 6.5  --   --   --   ALBUMIN 2.6* 2.7* 2.9*  --   --   --   AST 11* 13* 15  --   --   --   ALT 19 20 19   --   --   --  ALKPHOS 102 95 92  --   --   --   BILITOT 0.8 0.7 1.2  --   --   --   GFRNONAA 43* 41* 43* 49* 39* 43*  ANIONGAP 13 13 13  16* 15 16*     Hematology Recent Labs  Lab 12/10/20 0712 12/11/20 0606 12/12/20 0514  WBC 8.7 10.3 11.7*  RBC 4.52 4.74 5.02  HGB 13.2 13.7 14.6  HCT 43.1 46.0 48.4*  MCV 95.4 97.0 96.4  MCH 29.2 28.9 29.1  MCHC 30.6 29.8* 30.2  RDW 15.9* 15.9* 15.9*  PLT 230 264 283    BNP Recent Labs  Lab 12/05/20 1714 12/12/20 0514  BNP 695.0* 279.0*     DDimer No results for input(s): DDIMER in the last 168 hours.   Radiology    No results found.  Cardiac Studies     Patient Profile        Theresa Kato Mooreis a 55 y.o.femalewith past medical history of CAD (s/p CABG in 2007, DES to SVG-PDA in 10/2015, cath in 03/2018 showing patent LIMA-LAD and total occlusion of SVG-RCA and RCA territory supplied by collaterals from LAD and LCx), tracheostomy (placed in 2002), chroniccombined systolic and diastolic CHF, paroxysmal atrial flutter, HTN, HLD, COPD,afib,continued tobacco use, and prior DVT (known lupus anticoagulant)who is being seen today for the  evaluation ofCHFat the request ofDr. Shahmehdi.  Assessment & Plan    1. Acute on chronic combined systolic/diastolic HF -0/3/21 echo LVEF 22-48%, normal diastolic function, normal RV, PASP 52 - from primary cardiology note baseline weight around 207 lbs. Listed as 208 lbs today  - from reported I/Os neg 175mL yesterday, neg 10.5 L since admission (though total I/Os are incomplete). She received IV lasix 80mg  x 1 yesterday. Mild variations in Cr, downtrend from yesterday - BNP down from peak of 695-->279.   Will transition to oral torsemide 80mg  bid  metolaonze 5mg  on Mondays and Thursday.Would increase her home KCl to 12mEq daily.   2. Chronic respiratory failure/Trach - per pulmonary   From cardiac standpoint would be ok with discharge today.   For questions or updates, please contact [place] Please consult [email or web address] for contact info under        Signed, Carlyle Dolly, MD  12/12/2020, 9:32 AM

## 2020-12-12 NOTE — Progress Notes (Signed)
Physical Therapy Treatment Patient Details Name: ROBBYN [name] MRN: [id number] DOB: [birth year] Today's Date: 12/12/2020    History of Present Illness Theresa [name]  is a 55 y.o. female, with systolic and diastolic CHF, COPD, continued tobacco abuse, diabetes mellitus type 2, lupus anticoagulant syndrome, coronary artery disease with history of STEMI, hypertension, chronic respiratory failure on 4 L, chronic tracheostomy (since 2002).  -Patient presents today secondary to complaints of shortness of breath, over the last week, she reports worsening dyspnea, leg swelling, per EMS she was 70% on her baseline 4 L nasal cannula, in ED she was 82% on 4 L, she is currently requiring ATC at 28% (10 L), denies fever, chills, chest pain, abdominal pain, no dysuria or polyuria, reports she has been taking torsemide as instructed, reports she has been compliant with her low-salt diet as well, .  -In ED her chest x-ray significant cardiomegaly with vascular congestion, her BNP elevated at 195, glucose elevated at 314, she was wheezing, dyspnea has improved with IV Lasix and IV Solu-Medrol, [facility] hospitalist consulted to admit.    PT Comments    Patient demonstrates increased endurance/distance for ambulation in room with slow labored cadence without loss of balance, limited mostly due to fatigue and tolerated sitting up in chair after therapy to eat lunch.  Patient will benefit from continued physical therapy in hospital and recommended venue below to increase strength, balance, endurance for safe ADLs and gait.    Follow Up Recommendations  Home health PT;Supervision for mobility/OOB;Supervision - Intermittent     Equipment Recommendations  None recommended by PT    Recommendations for Other Services       Precautions / Restrictions Precautions Precautions: Fall Restrictions Weight Bearing Restrictions: No    Mobility  Bed Mobility Overal bed mobility: Modified Independent              General bed mobility comments: increased time, labored movement with HOB raised    Transfers Overall transfer level: Needs assistance Equipment used: Rolling walker (2 wheeled) Transfers: Sit to/from [facility] Sit to Stand: Modified independent (Device/Increase time) Stand pivot transfers: Modified independent (Device/Increase time)       General transfer comment: good return for transferring using RW without loss of balance  Ambulation/Gait Ambulation/Gait assistance: Supervision Gait Distance (Feet): 20 Feet Assistive device: Rolling walker (2 wheeled) Gait Pattern/deviations: Decreased step length - right;Decreased step length - left;Decreased stride length Gait velocity: decreased   General Gait Details: demonstrates increased endurance/distance for taking steps in room with slow labord movement, no loss of balance   Stairs             Wheelchair Mobility    Modified Rankin (Stroke Patients Only)       Balance Overall balance assessment: Needs assistance Sitting-balance support: Feet supported;No upper extremity supported Sitting balance-Leahy Scale: Good Sitting balance - Comments: seated at EOB   Standing balance support: During functional activity;Bilateral upper extremity supported Standing balance-Leahy Scale: Fair Standing balance comment: fair/good using RW                            Cognition Arousal/Alertness: Awake/alert Behavior During Therapy: WFL for tasks assessed/performed Overall Cognitive Status: Within Functional Limits for tasks assessed  Exercises General Exercises - Lower Extremity Long Arc Quad: Seated;AROM;Strengthening;Both;10 reps Hip Flexion/Marching: Seated;AROM;Strengthening;Both;10 reps Toe Raises: Seated;AROM;Strengthening;Both;15 reps Heel Raises: Seated;AROM;Strengthening;Both;15 reps    General Comments        Pertinent  Vitals/Pain Pain Assessment: No/denies pain    Home Living                      Prior Function            PT Goals (current goals can now be found in the care plan section) Acute Rehab PT Goals Patient Stated Goal: return home with family to assist PT Goal Formulation: With patient/family Time For Goal Achievement: 12/16/20 Potential to Achieve Goals: Good Progress towards PT goals: Progressing toward goals    Frequency    Min 3X/week      PT Plan Current plan remains appropriate    Co-evaluation              AM-PAC PT "6 Clicks" Mobility   Outcome Measure  Help needed turning from your back to your side while in a flat bed without using bedrails?: None Help needed moving from lying on your back to sitting on the side of a flat bed without using bedrails?: A Little Help needed moving to and from a bed to a chair (including a wheelchair)?: A Little Help needed standing up from a chair using your arms (e.g., wheelchair or bedside chair)?: A Little Help needed to walk in hospital room?: A Little Help needed climbing 3-5 steps with a railing? : A Little 6 Click Score: 19    End of Session Equipment Utilized During Treatment: Oxygen Activity Tolerance: Patient tolerated treatment well;Patient limited by fatigue Patient left: in chair;with call bell/phone within reach Nurse Communication: Mobility status PT Visit Diagnosis: Unsteadiness on feet (R26.81);Other abnormalities of gait and mobility (R26.89);Muscle weakness (generalized) (M62.81)     Time: 1165-7903 PT Time Calculation (min) (ACUTE ONLY): 27 min  Charges:  $Therapeutic Exercise: 8-22 mins $Therapeutic Activity: 8-22 mins                     2:05 PM, 12/12/20 Lonell Grandchild, MPT Physical Therapist with [facility] [phone] office [phone] mobile phone

## 2020-12-12 NOTE — Progress Notes (Signed)
Pt asleep on 40% trach collar spo2 91-92% no distress noted

## 2020-12-12 NOTE — Progress Notes (Signed)
Pt discharged in stable condition via wheelchair with O2 @ 4L/[place] into the care of her husband via private vehicle. Pt's personal oxygen tank is in the car to be utilized for trip home. Discharge instructions reviewed with pt/husband. Pt/husband verbalized understanding. PIV removed, intact, w/o S&S of complications. Dr. Melvyn Novas contacted regarding pt needing a #4 uncuffed trach at home for replacement as pt states that she does not have a #4 and had to insert a #6. Dr. Melvyn Novas advised the pt to continue using the trach that is currently in place until he sees her in his office. Pt notified and verbalized understanding. Educated pt on the importance of using trach collar at night for O2 delivery. Pt's husband given contact # for [employer] via [place], SW, to notify [employer] once they arrive home so they can bring out DME trach collar/O2 supplies. Pt/husband verbalized understanding of all discharge information and had no further questions. VSS. Denies pain. Tolerating diet. Pt states that she is feeling much better and is appreciative for her care.

## 2020-12-14 DIAGNOSIS — J9621 Acute and chronic respiratory failure with hypoxia: Secondary | ICD-10-CM | POA: Diagnosis not present

## 2020-12-14 DIAGNOSIS — D6862 Lupus anticoagulant syndrome: Secondary | ICD-10-CM | POA: Diagnosis not present

## 2020-12-14 DIAGNOSIS — I4892 Unspecified atrial flutter: Secondary | ICD-10-CM | POA: Diagnosis not present

## 2020-12-14 DIAGNOSIS — E1122 Type 2 diabetes mellitus with diabetic chronic kidney disease: Secondary | ICD-10-CM | POA: Diagnosis not present

## 2020-12-14 DIAGNOSIS — F32A Depression, unspecified: Secondary | ICD-10-CM | POA: Diagnosis not present

## 2020-12-14 DIAGNOSIS — Z930 Tracheostomy status: Secondary | ICD-10-CM | POA: Diagnosis not present

## 2020-12-14 DIAGNOSIS — Z86711 Personal history of pulmonary embolism: Secondary | ICD-10-CM | POA: Diagnosis not present

## 2020-12-14 DIAGNOSIS — N1832 Chronic kidney disease, stage 3b: Secondary | ICD-10-CM | POA: Diagnosis not present

## 2020-12-14 DIAGNOSIS — E1165 Type 2 diabetes mellitus with hyperglycemia: Secondary | ICD-10-CM | POA: Diagnosis not present

## 2020-12-14 DIAGNOSIS — J441 Chronic obstructive pulmonary disease with (acute) exacerbation: Secondary | ICD-10-CM | POA: Diagnosis not present

## 2020-12-14 DIAGNOSIS — C539 Malignant neoplasm of cervix uteri, unspecified: Secondary | ICD-10-CM | POA: Diagnosis not present

## 2020-12-14 DIAGNOSIS — I130 Hypertensive heart and chronic kidney disease with heart failure and stage 1 through stage 4 chronic kidney disease, or unspecified chronic kidney disease: Secondary | ICD-10-CM | POA: Diagnosis not present

## 2020-12-14 DIAGNOSIS — L409 Psoriasis, unspecified: Secondary | ICD-10-CM | POA: Diagnosis not present

## 2020-12-14 DIAGNOSIS — E785 Hyperlipidemia, unspecified: Secondary | ICD-10-CM | POA: Diagnosis not present

## 2020-12-14 DIAGNOSIS — Z720 Tobacco use: Secondary | ICD-10-CM | POA: Diagnosis not present

## 2020-12-14 DIAGNOSIS — I5043 Acute on chronic combined systolic (congestive) and diastolic (congestive) heart failure: Secondary | ICD-10-CM | POA: Diagnosis not present

## 2020-12-14 DIAGNOSIS — E876 Hypokalemia: Secondary | ICD-10-CM | POA: Diagnosis not present

## 2020-12-14 DIAGNOSIS — K219 Gastro-esophageal reflux disease without esophagitis: Secondary | ICD-10-CM | POA: Diagnosis not present

## 2020-12-14 DIAGNOSIS — I2510 Atherosclerotic heart disease of native coronary artery without angina pectoris: Secondary | ICD-10-CM | POA: Diagnosis not present

## 2020-12-14 DIAGNOSIS — Z6841 Body Mass Index (BMI) 40.0 and over, adult: Secondary | ICD-10-CM | POA: Diagnosis not present

## 2020-12-14 DIAGNOSIS — I252 Old myocardial infarction: Secondary | ICD-10-CM | POA: Diagnosis not present

## 2020-12-14 DIAGNOSIS — E871 Hypo-osmolality and hyponatremia: Secondary | ICD-10-CM | POA: Diagnosis not present

## 2020-12-14 DIAGNOSIS — E039 Hypothyroidism, unspecified: Secondary | ICD-10-CM | POA: Diagnosis not present

## 2020-12-14 DIAGNOSIS — Z955 Presence of coronary angioplasty implant and graft: Secondary | ICD-10-CM | POA: Diagnosis not present

## 2020-12-16 DIAGNOSIS — J9621 Acute and chronic respiratory failure with hypoxia: Secondary | ICD-10-CM | POA: Diagnosis not present

## 2020-12-16 DIAGNOSIS — J441 Chronic obstructive pulmonary disease with (acute) exacerbation: Secondary | ICD-10-CM | POA: Diagnosis not present

## 2020-12-16 DIAGNOSIS — I130 Hypertensive heart and chronic kidney disease with heart failure and stage 1 through stage 4 chronic kidney disease, or unspecified chronic kidney disease: Secondary | ICD-10-CM | POA: Diagnosis not present

## 2020-12-16 DIAGNOSIS — E1122 Type 2 diabetes mellitus with diabetic chronic kidney disease: Secondary | ICD-10-CM | POA: Diagnosis not present

## 2020-12-16 DIAGNOSIS — N1832 Chronic kidney disease, stage 3b: Secondary | ICD-10-CM | POA: Diagnosis not present

## 2020-12-16 DIAGNOSIS — I5043 Acute on chronic combined systolic (congestive) and diastolic (congestive) heart failure: Secondary | ICD-10-CM | POA: Diagnosis not present

## 2020-12-17 DIAGNOSIS — E1122 Type 2 diabetes mellitus with diabetic chronic kidney disease: Secondary | ICD-10-CM | POA: Diagnosis not present

## 2020-12-17 DIAGNOSIS — J9621 Acute and chronic respiratory failure with hypoxia: Secondary | ICD-10-CM | POA: Diagnosis not present

## 2020-12-17 DIAGNOSIS — N1832 Chronic kidney disease, stage 3b: Secondary | ICD-10-CM | POA: Diagnosis not present

## 2020-12-17 DIAGNOSIS — I5043 Acute on chronic combined systolic (congestive) and diastolic (congestive) heart failure: Secondary | ICD-10-CM | POA: Diagnosis not present

## 2020-12-17 DIAGNOSIS — I130 Hypertensive heart and chronic kidney disease with heart failure and stage 1 through stage 4 chronic kidney disease, or unspecified chronic kidney disease: Secondary | ICD-10-CM | POA: Diagnosis not present

## 2020-12-17 DIAGNOSIS — J441 Chronic obstructive pulmonary disease with (acute) exacerbation: Secondary | ICD-10-CM | POA: Diagnosis not present

## 2020-12-18 DIAGNOSIS — J9621 Acute and chronic respiratory failure with hypoxia: Secondary | ICD-10-CM | POA: Diagnosis not present

## 2020-12-18 DIAGNOSIS — J441 Chronic obstructive pulmonary disease with (acute) exacerbation: Secondary | ICD-10-CM | POA: Diagnosis not present

## 2020-12-18 DIAGNOSIS — E1122 Type 2 diabetes mellitus with diabetic chronic kidney disease: Secondary | ICD-10-CM | POA: Diagnosis not present

## 2020-12-18 DIAGNOSIS — I130 Hypertensive heart and chronic kidney disease with heart failure and stage 1 through stage 4 chronic kidney disease, or unspecified chronic kidney disease: Secondary | ICD-10-CM | POA: Diagnosis not present

## 2020-12-18 DIAGNOSIS — I5043 Acute on chronic combined systolic (congestive) and diastolic (congestive) heart failure: Secondary | ICD-10-CM | POA: Diagnosis not present

## 2020-12-18 DIAGNOSIS — N1832 Chronic kidney disease, stage 3b: Secondary | ICD-10-CM | POA: Diagnosis not present

## 2020-12-20 DIAGNOSIS — N390 Urinary tract infection, site not specified: Secondary | ICD-10-CM | POA: Diagnosis not present

## 2020-12-22 DIAGNOSIS — N1832 Chronic kidney disease, stage 3b: Secondary | ICD-10-CM | POA: Diagnosis not present

## 2020-12-22 DIAGNOSIS — E1122 Type 2 diabetes mellitus with diabetic chronic kidney disease: Secondary | ICD-10-CM | POA: Diagnosis not present

## 2020-12-22 DIAGNOSIS — I130 Hypertensive heart and chronic kidney disease with heart failure and stage 1 through stage 4 chronic kidney disease, or unspecified chronic kidney disease: Secondary | ICD-10-CM | POA: Diagnosis not present

## 2020-12-22 DIAGNOSIS — I5043 Acute on chronic combined systolic (congestive) and diastolic (congestive) heart failure: Secondary | ICD-10-CM | POA: Diagnosis not present

## 2020-12-22 DIAGNOSIS — J441 Chronic obstructive pulmonary disease with (acute) exacerbation: Secondary | ICD-10-CM | POA: Diagnosis not present

## 2020-12-22 DIAGNOSIS — J9621 Acute and chronic respiratory failure with hypoxia: Secondary | ICD-10-CM | POA: Diagnosis not present

## 2020-12-24 DIAGNOSIS — J9621 Acute and chronic respiratory failure with hypoxia: Secondary | ICD-10-CM | POA: Diagnosis not present

## 2020-12-24 DIAGNOSIS — I5043 Acute on chronic combined systolic (congestive) and diastolic (congestive) heart failure: Secondary | ICD-10-CM | POA: Diagnosis not present

## 2020-12-24 DIAGNOSIS — E1122 Type 2 diabetes mellitus with diabetic chronic kidney disease: Secondary | ICD-10-CM | POA: Diagnosis not present

## 2020-12-24 DIAGNOSIS — I130 Hypertensive heart and chronic kidney disease with heart failure and stage 1 through stage 4 chronic kidney disease, or unspecified chronic kidney disease: Secondary | ICD-10-CM | POA: Diagnosis not present

## 2020-12-24 DIAGNOSIS — M329 Systemic lupus erythematosus, unspecified: Secondary | ICD-10-CM | POA: Diagnosis not present

## 2020-12-24 DIAGNOSIS — J449 Chronic obstructive pulmonary disease, unspecified: Secondary | ICD-10-CM | POA: Diagnosis not present

## 2020-12-24 DIAGNOSIS — J441 Chronic obstructive pulmonary disease with (acute) exacerbation: Secondary | ICD-10-CM | POA: Diagnosis not present

## 2020-12-24 DIAGNOSIS — E1165 Type 2 diabetes mellitus with hyperglycemia: Secondary | ICD-10-CM | POA: Diagnosis not present

## 2020-12-24 DIAGNOSIS — Z299 Encounter for prophylactic measures, unspecified: Secondary | ICD-10-CM | POA: Diagnosis not present

## 2020-12-24 DIAGNOSIS — N1832 Chronic kidney disease, stage 3b: Secondary | ICD-10-CM | POA: Diagnosis not present

## 2020-12-24 DIAGNOSIS — I509 Heart failure, unspecified: Secondary | ICD-10-CM | POA: Diagnosis not present

## 2020-12-24 NOTE — Progress Notes (Addendum)
Virtual Visit via Telephone Note   This visit type was conducted due to national recommendations for restrictions regarding the COVID-19 Pandemic (e.g. social distancing) in an effort to limit this patient's exposure and mitigate transmission in our community.  Due to her co-morbid illnesses, this patient is at least at moderate risk for complications without adequate follow up.  This format is felt to be most appropriate for this patient at this time.  The patient did not have access to video technology/had technical difficulties with video requiring transitioning to audio format only (telephone).  All issues noted in this document were discussed and addressed.  No physical exam could be performed with this format.  Please refer to the patient's chart for her  consent to telehealth for [facility].    Date:  02/09/2021   ID:  Theresa [name], DOB 12-27-1965, MRN [id number] The patient was identified using 2 identifiers.  Patient Location: Home Provider Location: Office/Clinic   PCP:  Monico Blitz, MD   [facility] [facility] Cardiologist:  [name], MD     Evaluation Performed:  Follow-Up Visit  Chief Complaint:  Follow up  History of Present Illness:    Theresa [name] is a 55 y.o. female with a history of CAD, chronic diastolic CHF, chronic respiratory failure, COPD, DM 2, HTN, HLD, lupus, morbid obesity, STEMI, cardiac arrest, acute kidney failure lesion of tubular necrosis.  Acute systolic heart failure, DVT, lupus anticoagulant syndrome, tobacco abuse, chronic respiratory failure on 4 L.  Chronic tracheostomy tracheostomy.  Previously seen by Dr. Percival Spanish 10/28/2020 office visit. He noted she had a recent admission to [facility] due to volume overload and was diuresed.  During that visit she was taken off amiodarone due to hypothyroid.  Torsemide was changed to 60 mg daily.  She noticed since going home she had not been taking 60 mg twice daily.  Her weight continued to  increase.  She stated her dry weight was likely around 207.  Weight was 222 on that visit.  She had been having increasing edema in her legs.  Chronically on 4 L of oxygen at home.  He ordered Zaroxolyn 5 mg for 2 days only.  She was going to double her potassium.  Recent admission 12/05/2020 : Dx :  Acute on chronic respiratory failure with hypoxia due to COPD and CHF exacerbation.  Acute on chronic combined CHF.  IV diuresis. Treated with Lasix 80 mg p.o. twice daily then decreased to once daily. She was discharged home with torsemide 80 mg p.o. twice daily and metolazone 5 mg Monday and Thursday.  To continue spironolactone. She was net negative 10.3 liters with discharge weight of 208. Dry weight noted to be 206-207 lbs. Echo 12/06/2020 EF 45-50% with global hypokinesis.   Spoke to patient by phone today.  She states she has been feeling nauseated without much of an appetite.  She continues to smoke per nursing staff when asked about smoking.  She states she has not received her oxygen concentrator for her 40% trach collar as of yet. She states the home health nurse was there recently and tried to obtain labs but was unable to obtain a specimen.  She states her pulse has been running in the 57-59 range.  Her O2 saturation on 4 L nasal O2 is 97% today.  She states her weight today is 208.  She states she is compliant with her torsemide, metolazone, and spironolactone.  She states she is having some  pain on the left side of her throat near her trach.  States she is coughing up a small amount of yellow-colored sputum.    The patient does not have symptoms concerning for COVID-19 infection (fever, chills, cough, or new shortness of breath).    Past Medical History:  Diagnosis Date  . Acute kidney failure with lesion of tubular necrosis (HCC)   . Acute systolic heart failure ([place])   . CAD (coronary artery disease)    a. s/p prior PCI. b. CABG 2007 at [facility] in [place] 2007. c. inferior STEMI  10/2015 s/p DES to dSVG-PDA.  . Cardiac arrest ([place])   . Cervical cancer ([place])   . Chronic diastolic CHF (congestive heart failure) ([place])   . Chronic respiratory failure ([place])    s/p tracheostomy 2002  . Chronic RUQ pain   . COPD (chronic obstructive pulmonary disease) ([place])   . Diabetes mellitus ([place])   . DVT (deep venous thrombosis) ([place])   . Endometriosis   . History of gallstones 01/2016   seen on Ultrasound  . History of HIDA scan 11/2016   normal  . HTN (hypertension)   . Hyperlipidemia   . Lupus anticoagulant disorder (HCC)    on coumadin  . Morbid obesity ([place])   . Psoriasis   . ST elevation (STEMI) myocardial infarction involving right coronary artery ([place]) 10/29/15   stent to VG to PDA  . Toe fracture, right 03/29/2018  . Tracheostomy in place [facility]), chronic since 2002 11/03/2015   Past Surgical History:  Procedure Laterality Date  . CARDIAC CATHETERIZATION N/A 10/29/2015   Procedure: Left Heart Cath and Cors/Grafts Angiography;  Surgeon: Burnell Blanks, MD;  Location: [place] [facility];  Service: Cardiovascular;  Laterality: N/A;  . CARDIAC CATHETERIZATION  10/29/2015   Procedure: Coronary Stent Intervention;  Surgeon: Burnell Blanks, MD;  Location: [place] [facility];  Service: Cardiovascular;;  . CAROTID STENT    . CESAREAN SECTION WITH BILATERAL TUBAL LIGATION    . CORONARY ARTERY BYPASS GRAFT  2007   2V  . IR GASTROSTOMY TUBE MOD SED  11/13/2018  . RIGHT/LEFT HEART CATH AND CORONARY/GRAFT ANGIOGRAPHY N/A 03/24/2018   Procedure: RIGHT/LEFT HEART CATH AND CORONARY/GRAFT ANGIOGRAPHY;  Surgeon: Belva Crome, MD;  Location: [place] [facility];  Service: Cardiovascular;  Laterality: N/A;  . TRACHEOSTOMY       Current Meds  Medication Sig  . albuterol (PROVENTIL) (2.5 MG/3ML) 0.083% nebulizer solution Take 3 mLs (2.5 mg total) by nebulization every 4 (four) hours as needed for wheezing.  Marland Kitchen atorvastatin (LIPITOR) 20 MG tablet Take 20 mg by mouth daily.   . diazepam (VALIUM) 5 MG tablet Take 5 mg by mouth daily as needed.  . digoxin (LANOXIN) 0.125 MG tablet Take 1 tablet (125 mcg total) by mouth daily. Started 10/31/2020  . gabapentin (NEURONTIN) 300 MG capsule Take 3 capsules (900 mg total) by mouth at bedtime.  . insulin degludec (TRESIBA FLEXTOUCH) 100 UNIT/ML FlexTouch Pen Inject 30 Units into the skin at bedtime. (Patient taking differently: Inject 40 Units into the skin at bedtime.)  . levothyroxine (SYNTHROID) 50 MCG tablet Take 1 tablet by mouth daily.  Marland Kitchen LORazepam (ATIVAN) 0.5 MG tablet Take 0.5 mg by mouth as needed for anxiety.  . metolazone (ZAROXOLYN) 5 MG tablet Take 1 tablet (5 mg total) by mouth 2 (two) times a week.  . metoprolol succinate (TOPROL-XL) 25 MG 24 hr tablet Take 25 mg by mouth daily.  Marland Kitchen  mometasone (ELOCON) 0.1 % ointment Apply 1 application topically daily as needed.  . nitroGLYCERIN (NITROSTAT) 0.4 MG SL tablet Place 0.4 mg under the tongue every 5 (five) minutes as needed for chest pain.  Marland Kitchen NOVOLOG FLEXPEN 100 UNIT/ML FlexPen Inject 1-10 Units into the skin 3 (three) times daily before meals.  . pantoprazole (PROTONIX) 20 MG tablet Take 20 mg by mouth daily.  . polyethylene glycol (MIRALAX / GLYCOLAX) 17 g packet Take 17 g by mouth daily as needed for mild constipation.  . potassium chloride SA (KLOR-CON) 20 MEQ tablet Take 3 tablets (60 mEq total) by mouth daily.  . sertraline (ZOLOFT) 50 MG tablet Take 50 mg by mouth at bedtime.   Marland Kitchen spironolactone (ALDACTONE) 25 MG tablet Take 1 tablet (25 mg total) by mouth 2 (two) times daily.  . tamsulosin (FLOMAX) 0.4 MG CAPS capsule Take 0.4 mg by mouth daily.  Marland Kitchen torsemide 40 MG TABS Take 80 mg by mouth 2 (two) times daily.  . traMADol (ULTRAM) 50 MG tablet Take 1 tablet (50 mg total) by mouth 3 (three) times daily as needed for moderate pain.  Marland Kitchen warfarin (COUMADIN) 2.5 MG tablet Take 0.5-1 tablets (1.25-2.5 mg total) by mouth See admin instructions. 2.5mg  MWF and 1.25mg   every other day. (Patient taking differently: Take 1.125-2.5 mg by mouth See admin instructions. 5mg  on Monday & Friday and 2.5mg  all other days - MANAGED BY PCP)     Allergies:   Penicillins, Ciprofloxacin, and Ibuprofen   Social History   Tobacco Use  . Smoking status: Current Every Day Smoker    Packs/day: 0.75    Types: Cigarettes    Start date: 10/23/1978  . Smokeless tobacco: Never Used  . Tobacco comment: 1/2 pack - 1 pack - trying to cut back   Vaping Use  . Vaping Use: Never used  Substance Use Topics  . Alcohol use: No    Alcohol/week: 0.0 standard drinks  . Drug use: No     Family Hx: The patient's family history includes Diabetes in her father and mother; Hypertension in her father and mother. There is no history of Colon cancer or Colon polyps.  ROS:   Please see the history of present illness.    All other systems reviewed and are negative.   Prior CV studies:   The following studies were reviewed today:  Echocardiogram 12/06/2020  1. Left ventricular ejection fraction, by estimation, is 45 to 50%. The left ventricle has mildly decreased function. The left ventricle demonstrates global hypokinesis. There is mild left ventricular hypertrophy. Left ventricular diastolic parameters were normal. 2. Right ventricular systolic function is normal. The right ventricular size is normal. There is moderately elevated pulmonary artery systolic pressure. The estimated right ventricular systolic pressure is 16.1 mmHg. 3. The mitral valve is normal in structure. No evidence of mitral valve regurgitation. No evidence of mitral stenosis. 4. The aortic valve is normal in structure. Aortic valve regurgitation is not visualized. No aortic stenosis is present. 5. The inferior vena cava is normal in size with greater than 50% respiratory variability, suggesting right atrial pressure of 3 mmHg. Comparison(s): No significant change from prior study. Prior images reviewed side by  side.  Echocardiogram 07/02/2020 1. Difficult asssessment of LVEF in setting of aflutter as well as limited visualization. . Left ventricular ejection fraction, by estimation, is 50%. The left ventricle has mildly decreased function. The left ventricle demonstrates global hypokinesis. There is mild left ventricular hypertrophy. Left ventricular diastolic parameters are indeterminate.  2. Right ventricular systolic function was not well visualized. The right ventricular size is not well visualized. 3. Left atrial size was mildly dilated. 4. The mitral valve is normal in structure. No evidence of mitral valve regurgitation. No evidence of mitral stenosis. 5. The aortic valve is tricuspid. Aortic valve regurgitation is not visualized. No aortic stenosis is present. 6. The inferior vena cava is normal in size with greater than 50% respiratory variability, suggesting right atrial pressure of 3 mmHg.   03/24/2018 RIGHT/LEFT HEART CATH AND CORONARY/GRAFT ANGIOGRAPHY   Conclusion   Severe native coronary artery disease with total occlusion of the proximal LAD, total occlusion of the proximal RCA, and patent circumflex with patent proximal stent with eccentric 50% proximal narrowing.  Distal obtuse marginal branches are severely and diffusely diseased.  Bypass graft failure with total occlusion of SVG to RCA.  Patent LIMA to LAD.  Right coronary territory is supplied by collaterals from LAD and circumflex.  Left ventricular systolic dysfunction with EF 35 to 45%.  Mid to distal inferior wall akinesis.  Inferobasal and inferoapical wall severely hypokinetic.  Left ventricular end-diastolic pressure elevated at 19 mmHg  Moderate to severe pulmonary hypertension  RECOMMENDATIONS:   Resume Coumadin therapy overlap with heparin  Continue Plavix  Management of pulmonary status to maintain adequate oxygenation which may help decrease severity of pulmonary hypertension.   Recommend to  resume Warfarin, at currently prescribed dose and frequency, on today.  Recommend concurrent antiplatelet therapy of Clopidogrel 75mg  daily for Indefinite  Diagnostic Dominance: Right     Labs/Other Tests and Data Reviewed:    EKG:   EKG October 28, 2020 normal sinus rhythm rate of 72, anterior infarct, age undetermined, marked ST abnormality, possible lateral subendocardial injury.    Recent Labs: 12/09/2020: ALT 19 12/10/2020: TSH 7.254 12/11/2020: Magnesium 1.9 12/12/2020: B Natriuretic Peptide 279.0; BUN 45; Creatinine, Ser 1.45; Hemoglobin 14.6; Platelets 283; Potassium 3.3; Sodium 141   Recent Lipid Panel Lab Results  Component Value Date/Time   CHOL 221 (H) 05/05/2020 09:28 AM   TRIG 165 (H) 05/05/2020 09:28 AM   HDL 71 05/05/2020 09:28 AM   CHOLHDL 3.1 05/05/2020 09:28 AM   CHOLHDL 3.8 03/24/2018 04:51 AM   LDLCALC 121 (H) 05/05/2020 09:28 AM    Wt Readings from Last 3 Encounters:  12/25/20 208 lb (94.3 kg)  12/12/20 208 lb 12.4 oz (94.7 kg)  11/11/20 218 lb 12.8 oz (99.2 kg)     Risk Assessment/Calculations:      Objective:    Vital Signs:  Ht 4\' 10"  (1.473 m)   Wt 208 lb (94.3 kg)   BMI 43.47 kg/m    VITAL SIGNS:  reviewed  ASSESSMENT & PLAN:     1. Chronic combined systolic (congestive) and diastolic (congestive) heart failure (HCC) Recent admission for acute on chronic combined heart failure on 12/05/2020.  She received IV diuretics and diuresed 10.3 L of fluid.  Her discharge weight was 208.  Today her weight remains at 208.  She states she is compliant with her torsemide, metolazone and spironolactone.  Advised her to continue to weigh herself daily to maintain weight around 206 to 207 which appears to be her dry weight from provider notes.  Advised fluid restrictions of around 60 ounces daily.  Restrict sodium.  Patient had some mild hypokalemia at discharge.  Patient states she has been taking 3 potassium pills (60 mEq) daily since discharge from hospital.   Continue torsemide 80 mg p.o. twice daily.  Continue  metolazone 5 mg 2 times weekly.  Continue spironolactone 25 mg p.o. twice daily.  Please get a follow-up basic metabolic panel and magnesium in 2 weeks if possible.  She states the home health nurse was recently there and tried multiple attempts to draw blood without success.  2. Essential hypertension Blood pressure well controlled at last visit at 118/62.  No blood pressure available today due to virtual visit  3. CAD in native artery No anginal or exertional symptoms or nitroglycerin use.  Continue nitroglycerin as needed.  Continue atorvastatin 20 mg daily.  4. History of DVT (deep vein thrombosis) No DVT or PE-like symptoms.  Continue Coumadin as directed.   5. Atrial fibrillation, unspecified type ([place]) Continue Coumadin 2.5 mg Monday Wednesday and Friday and 1.25 mg every other day.  Follow with Coumadin clinic.    COVID-19 Education: The signs and symptoms of COVID-19 were discussed with the patient and how to seek care for testing (follow up with PCP or arrange E-visit).  The importance of social distancing was discussed today.  Time:   Today, I have spent 10 minutes with the patient with telehealth technology discussing the above problems.     Medication Adjustments/Labs and Tests Ordered: Current medicines are reviewed at length with the patient today.  Concerns regarding medicines are outlined above.   Tests Ordered: Orders Placed This Encounter  Procedures  . Basic metabolic panel  . Magnesium    Medication Changes: No orders of the defined types were placed in this encounter.   Follow Up:  In Person in 3 month(s) Dr Percival Spanish or APP  Signed, Verta Ellen, NP  02/09/2021 9:39 PM    [place]

## 2020-12-25 ENCOUNTER — Telehealth (INDEPENDENT_AMBULATORY_CARE_PROVIDER_SITE_OTHER): Payer: Medicare Other | Admitting: Family Medicine

## 2020-12-25 ENCOUNTER — Encounter: Payer: Self-pay | Admitting: Family Medicine

## 2020-12-25 ENCOUNTER — Telehealth: Payer: Self-pay | Admitting: Family Medicine

## 2020-12-25 VITALS — Ht <= 58 in | Wt 208.0 lb

## 2020-12-25 DIAGNOSIS — J9621 Acute and chronic respiratory failure with hypoxia: Secondary | ICD-10-CM | POA: Diagnosis not present

## 2020-12-25 DIAGNOSIS — I10 Essential (primary) hypertension: Secondary | ICD-10-CM | POA: Diagnosis not present

## 2020-12-25 DIAGNOSIS — Z86718 Personal history of other venous thrombosis and embolism: Secondary | ICD-10-CM | POA: Diagnosis not present

## 2020-12-25 DIAGNOSIS — I130 Hypertensive heart and chronic kidney disease with heart failure and stage 1 through stage 4 chronic kidney disease, or unspecified chronic kidney disease: Secondary | ICD-10-CM | POA: Diagnosis not present

## 2020-12-25 DIAGNOSIS — J441 Chronic obstructive pulmonary disease with (acute) exacerbation: Secondary | ICD-10-CM

## 2020-12-25 DIAGNOSIS — I5042 Chronic combined systolic (congestive) and diastolic (congestive) heart failure: Secondary | ICD-10-CM

## 2020-12-25 DIAGNOSIS — I4891 Unspecified atrial fibrillation: Secondary | ICD-10-CM | POA: Diagnosis not present

## 2020-12-25 DIAGNOSIS — N1832 Chronic kidney disease, stage 3b: Secondary | ICD-10-CM | POA: Diagnosis not present

## 2020-12-25 DIAGNOSIS — E1122 Type 2 diabetes mellitus with diabetic chronic kidney disease: Secondary | ICD-10-CM | POA: Diagnosis not present

## 2020-12-25 DIAGNOSIS — I2510 Atherosclerotic heart disease of native coronary artery without angina pectoris: Secondary | ICD-10-CM | POA: Diagnosis not present

## 2020-12-25 DIAGNOSIS — I5043 Acute on chronic combined systolic (congestive) and diastolic (congestive) heart failure: Secondary | ICD-10-CM | POA: Diagnosis not present

## 2020-12-25 NOTE — Addendum Note (Signed)
Addended by: Laurine Blazer on: 12/25/2020 04:16 PM   Modules accepted: Orders

## 2020-12-25 NOTE — Patient Instructions (Addendum)
Medication Instructions:  Continue all current medications.  Labwork:  BMET, Mg - orders included.   Please do in 2 weeks (around 01/08/2021).  Office will contact with results via phone or letter.    Testing/Procedures: none  Follow-Up: 3 months   Any Other Special Instructions Will Be Listed Below (If Applicable).  If you need a refill on your cardiac medications before your next appointment, please call your pharmacy.

## 2020-12-25 NOTE — Telephone Encounter (Signed)
  Patient Consent for Virtual Visit         Theresa [name] has provided verbal consent on 12/25/2020 for a virtual visit (video or telephone).   CONSENT FOR VIRTUAL VISIT FOR:  Theresa [name]  By participating in this virtual visit I agree to the following:  I hereby voluntarily request, consent and authorize [facility] and its employed or contracted physicians, physician assistants, nurse practitioners or other licensed health care professionals (the Practitioner), to provide me with telemedicine health care services (the "Services") as deemed necessary by the treating Practitioner. I acknowledge and consent to receive the Services by the Practitioner via telemedicine. I understand that the telemedicine visit will involve communicating with the Practitioner through live audiovisual communication technology and the disclosure of certain medical information by electronic transmission. I acknowledge that I have been given the opportunity to request an in-person assessment or other available alternative prior to the telemedicine visit and am voluntarily participating in the telemedicine visit.  I understand that I have the right to withhold or withdraw my consent to the use of telemedicine in the course of my care at any time, without affecting my right to future care or treatment, and that the Practitioner or I may terminate the telemedicine visit at any time. I understand that I have the right to inspect all information obtained and/or recorded in the course of the telemedicine visit and may receive copies of available information for a reasonable fee.  I understand that some of the potential risks of receiving the Services via telemedicine include:  Marland Kitchen Delay or interruption in medical evaluation due to technological equipment failure or disruption; . Information transmitted may not be sufficient (e.g. poor resolution of images) to allow for appropriate medical decision making by the Practitioner;  and/or  . In rare instances, security protocols could fail, causing a breach of personal health information.  Furthermore, I acknowledge that it is my responsibility to provide information about my medical history, conditions and care that is complete and accurate to the best of my ability. I acknowledge that Practitioner's advice, recommendations, and/or decision may be based on factors not within their control, such as incomplete or inaccurate data provided by me or distortions of diagnostic images or specimens that may result from electronic transmissions. I understand that the practice of medicine is not an exact science and that Practitioner makes no warranties or guarantees regarding treatment outcomes. I acknowledge that a copy of this consent can be made available to me via my patient portal ([facility]), or I can request a printed copy by calling the office of [facility].    I understand that my insurance will be billed for this visit.   I have read or had this consent read to me. . I understand the contents of this consent, which adequately explains the benefits and risks of the Services being provided via telemedicine.  . I have been provided ample opportunity to ask questions regarding this consent and the Services and have had my questions answered to my satisfaction. . I give my informed consent for the services to be provided through the use of telemedicine in my medical care

## 2020-12-29 DIAGNOSIS — N1832 Chronic kidney disease, stage 3b: Secondary | ICD-10-CM | POA: Diagnosis not present

## 2020-12-29 DIAGNOSIS — J441 Chronic obstructive pulmonary disease with (acute) exacerbation: Secondary | ICD-10-CM | POA: Diagnosis not present

## 2020-12-29 DIAGNOSIS — E1122 Type 2 diabetes mellitus with diabetic chronic kidney disease: Secondary | ICD-10-CM | POA: Diagnosis not present

## 2020-12-29 DIAGNOSIS — I5043 Acute on chronic combined systolic (congestive) and diastolic (congestive) heart failure: Secondary | ICD-10-CM | POA: Diagnosis not present

## 2020-12-29 DIAGNOSIS — I130 Hypertensive heart and chronic kidney disease with heart failure and stage 1 through stage 4 chronic kidney disease, or unspecified chronic kidney disease: Secondary | ICD-10-CM | POA: Diagnosis not present

## 2020-12-29 DIAGNOSIS — J9621 Acute and chronic respiratory failure with hypoxia: Secondary | ICD-10-CM | POA: Diagnosis not present

## 2020-12-30 DIAGNOSIS — J441 Chronic obstructive pulmonary disease with (acute) exacerbation: Secondary | ICD-10-CM | POA: Diagnosis not present

## 2020-12-30 DIAGNOSIS — J9621 Acute and chronic respiratory failure with hypoxia: Secondary | ICD-10-CM | POA: Diagnosis not present

## 2020-12-30 DIAGNOSIS — E1122 Type 2 diabetes mellitus with diabetic chronic kidney disease: Secondary | ICD-10-CM | POA: Diagnosis not present

## 2020-12-30 DIAGNOSIS — I130 Hypertensive heart and chronic kidney disease with heart failure and stage 1 through stage 4 chronic kidney disease, or unspecified chronic kidney disease: Secondary | ICD-10-CM | POA: Diagnosis not present

## 2020-12-30 DIAGNOSIS — N1832 Chronic kidney disease, stage 3b: Secondary | ICD-10-CM | POA: Diagnosis not present

## 2020-12-30 DIAGNOSIS — I5043 Acute on chronic combined systolic (congestive) and diastolic (congestive) heart failure: Secondary | ICD-10-CM | POA: Diagnosis not present

## 2021-01-01 DIAGNOSIS — N1832 Chronic kidney disease, stage 3b: Secondary | ICD-10-CM | POA: Diagnosis not present

## 2021-01-01 DIAGNOSIS — J441 Chronic obstructive pulmonary disease with (acute) exacerbation: Secondary | ICD-10-CM | POA: Diagnosis not present

## 2021-01-01 DIAGNOSIS — I5043 Acute on chronic combined systolic (congestive) and diastolic (congestive) heart failure: Secondary | ICD-10-CM | POA: Diagnosis not present

## 2021-01-01 DIAGNOSIS — I130 Hypertensive heart and chronic kidney disease with heart failure and stage 1 through stage 4 chronic kidney disease, or unspecified chronic kidney disease: Secondary | ICD-10-CM | POA: Diagnosis not present

## 2021-01-01 DIAGNOSIS — E1122 Type 2 diabetes mellitus with diabetic chronic kidney disease: Secondary | ICD-10-CM | POA: Diagnosis not present

## 2021-01-01 DIAGNOSIS — J9621 Acute and chronic respiratory failure with hypoxia: Secondary | ICD-10-CM | POA: Diagnosis not present

## 2021-01-01 DIAGNOSIS — E8770 Fluid overload, unspecified: Secondary | ICD-10-CM | POA: Diagnosis not present

## 2021-01-02 DIAGNOSIS — E119 Type 2 diabetes mellitus without complications: Secondary | ICD-10-CM | POA: Diagnosis not present

## 2021-01-02 DIAGNOSIS — E1122 Type 2 diabetes mellitus with diabetic chronic kidney disease: Secondary | ICD-10-CM | POA: Diagnosis not present

## 2021-01-02 DIAGNOSIS — J441 Chronic obstructive pulmonary disease with (acute) exacerbation: Secondary | ICD-10-CM | POA: Diagnosis not present

## 2021-01-02 DIAGNOSIS — I130 Hypertensive heart and chronic kidney disease with heart failure and stage 1 through stage 4 chronic kidney disease, or unspecified chronic kidney disease: Secondary | ICD-10-CM | POA: Diagnosis not present

## 2021-01-02 DIAGNOSIS — N1832 Chronic kidney disease, stage 3b: Secondary | ICD-10-CM | POA: Diagnosis not present

## 2021-01-02 DIAGNOSIS — I5043 Acute on chronic combined systolic (congestive) and diastolic (congestive) heart failure: Secondary | ICD-10-CM | POA: Diagnosis not present

## 2021-01-02 DIAGNOSIS — J9621 Acute and chronic respiratory failure with hypoxia: Secondary | ICD-10-CM | POA: Diagnosis not present

## 2021-01-05 DIAGNOSIS — E1122 Type 2 diabetes mellitus with diabetic chronic kidney disease: Secondary | ICD-10-CM | POA: Diagnosis not present

## 2021-01-05 DIAGNOSIS — N1832 Chronic kidney disease, stage 3b: Secondary | ICD-10-CM | POA: Diagnosis not present

## 2021-01-05 DIAGNOSIS — I130 Hypertensive heart and chronic kidney disease with heart failure and stage 1 through stage 4 chronic kidney disease, or unspecified chronic kidney disease: Secondary | ICD-10-CM | POA: Diagnosis not present

## 2021-01-05 DIAGNOSIS — J441 Chronic obstructive pulmonary disease with (acute) exacerbation: Secondary | ICD-10-CM | POA: Diagnosis not present

## 2021-01-05 DIAGNOSIS — J9621 Acute and chronic respiratory failure with hypoxia: Secondary | ICD-10-CM | POA: Diagnosis not present

## 2021-01-05 DIAGNOSIS — I5043 Acute on chronic combined systolic (congestive) and diastolic (congestive) heart failure: Secondary | ICD-10-CM | POA: Diagnosis not present

## 2021-01-06 DIAGNOSIS — N1832 Chronic kidney disease, stage 3b: Secondary | ICD-10-CM | POA: Diagnosis not present

## 2021-01-06 DIAGNOSIS — E1142 Type 2 diabetes mellitus with diabetic polyneuropathy: Secondary | ICD-10-CM | POA: Diagnosis not present

## 2021-01-06 DIAGNOSIS — Z930 Tracheostomy status: Secondary | ICD-10-CM | POA: Diagnosis not present

## 2021-01-06 DIAGNOSIS — I5042 Chronic combined systolic (congestive) and diastolic (congestive) heart failure: Secondary | ICD-10-CM | POA: Diagnosis not present

## 2021-01-06 DIAGNOSIS — J9621 Acute and chronic respiratory failure with hypoxia: Secondary | ICD-10-CM | POA: Diagnosis not present

## 2021-01-06 DIAGNOSIS — J441 Chronic obstructive pulmonary disease with (acute) exacerbation: Secondary | ICD-10-CM | POA: Diagnosis not present

## 2021-01-06 DIAGNOSIS — F17210 Nicotine dependence, cigarettes, uncomplicated: Secondary | ICD-10-CM | POA: Diagnosis not present

## 2021-01-06 DIAGNOSIS — E1122 Type 2 diabetes mellitus with diabetic chronic kidney disease: Secondary | ICD-10-CM | POA: Diagnosis not present

## 2021-01-06 DIAGNOSIS — I5043 Acute on chronic combined systolic (congestive) and diastolic (congestive) heart failure: Secondary | ICD-10-CM | POA: Diagnosis not present

## 2021-01-06 DIAGNOSIS — Z299 Encounter for prophylactic measures, unspecified: Secondary | ICD-10-CM | POA: Diagnosis not present

## 2021-01-06 DIAGNOSIS — I130 Hypertensive heart and chronic kidney disease with heart failure and stage 1 through stage 4 chronic kidney disease, or unspecified chronic kidney disease: Secondary | ICD-10-CM | POA: Diagnosis not present

## 2021-01-06 DIAGNOSIS — I2510 Atherosclerotic heart disease of native coronary artery without angina pectoris: Secondary | ICD-10-CM | POA: Diagnosis not present

## 2021-01-07 DIAGNOSIS — I130 Hypertensive heart and chronic kidney disease with heart failure and stage 1 through stage 4 chronic kidney disease, or unspecified chronic kidney disease: Secondary | ICD-10-CM | POA: Diagnosis not present

## 2021-01-07 DIAGNOSIS — I5043 Acute on chronic combined systolic (congestive) and diastolic (congestive) heart failure: Secondary | ICD-10-CM | POA: Diagnosis not present

## 2021-01-07 DIAGNOSIS — J441 Chronic obstructive pulmonary disease with (acute) exacerbation: Secondary | ICD-10-CM | POA: Diagnosis not present

## 2021-01-07 DIAGNOSIS — E1122 Type 2 diabetes mellitus with diabetic chronic kidney disease: Secondary | ICD-10-CM | POA: Diagnosis not present

## 2021-01-07 DIAGNOSIS — J9621 Acute and chronic respiratory failure with hypoxia: Secondary | ICD-10-CM | POA: Diagnosis not present

## 2021-01-07 DIAGNOSIS — N1832 Chronic kidney disease, stage 3b: Secondary | ICD-10-CM | POA: Diagnosis not present

## 2021-01-08 DIAGNOSIS — N390 Urinary tract infection, site not specified: Secondary | ICD-10-CM | POA: Diagnosis not present

## 2021-01-09 DIAGNOSIS — J441 Chronic obstructive pulmonary disease with (acute) exacerbation: Secondary | ICD-10-CM | POA: Diagnosis not present

## 2021-01-09 DIAGNOSIS — I130 Hypertensive heart and chronic kidney disease with heart failure and stage 1 through stage 4 chronic kidney disease, or unspecified chronic kidney disease: Secondary | ICD-10-CM | POA: Diagnosis not present

## 2021-01-09 DIAGNOSIS — I5043 Acute on chronic combined systolic (congestive) and diastolic (congestive) heart failure: Secondary | ICD-10-CM | POA: Diagnosis not present

## 2021-01-09 DIAGNOSIS — J9621 Acute and chronic respiratory failure with hypoxia: Secondary | ICD-10-CM | POA: Diagnosis not present

## 2021-01-09 DIAGNOSIS — N1832 Chronic kidney disease, stage 3b: Secondary | ICD-10-CM | POA: Diagnosis not present

## 2021-01-09 DIAGNOSIS — E1122 Type 2 diabetes mellitus with diabetic chronic kidney disease: Secondary | ICD-10-CM | POA: Diagnosis not present

## 2021-01-13 DIAGNOSIS — Z86711 Personal history of pulmonary embolism: Secondary | ICD-10-CM | POA: Diagnosis not present

## 2021-01-13 DIAGNOSIS — E039 Hypothyroidism, unspecified: Secondary | ICD-10-CM | POA: Diagnosis not present

## 2021-01-13 DIAGNOSIS — E871 Hypo-osmolality and hyponatremia: Secondary | ICD-10-CM | POA: Diagnosis not present

## 2021-01-13 DIAGNOSIS — F32A Depression, unspecified: Secondary | ICD-10-CM | POA: Diagnosis not present

## 2021-01-13 DIAGNOSIS — C539 Malignant neoplasm of cervix uteri, unspecified: Secondary | ICD-10-CM | POA: Diagnosis not present

## 2021-01-13 DIAGNOSIS — D6862 Lupus anticoagulant syndrome: Secondary | ICD-10-CM | POA: Diagnosis not present

## 2021-01-13 DIAGNOSIS — I130 Hypertensive heart and chronic kidney disease with heart failure and stage 1 through stage 4 chronic kidney disease, or unspecified chronic kidney disease: Secondary | ICD-10-CM | POA: Diagnosis not present

## 2021-01-13 DIAGNOSIS — Z6841 Body Mass Index (BMI) 40.0 and over, adult: Secondary | ICD-10-CM | POA: Diagnosis not present

## 2021-01-13 DIAGNOSIS — Z955 Presence of coronary angioplasty implant and graft: Secondary | ICD-10-CM | POA: Diagnosis not present

## 2021-01-13 DIAGNOSIS — I4892 Unspecified atrial flutter: Secondary | ICD-10-CM | POA: Diagnosis not present

## 2021-01-13 DIAGNOSIS — E1165 Type 2 diabetes mellitus with hyperglycemia: Secondary | ICD-10-CM | POA: Diagnosis not present

## 2021-01-13 DIAGNOSIS — L409 Psoriasis, unspecified: Secondary | ICD-10-CM | POA: Diagnosis not present

## 2021-01-13 DIAGNOSIS — J9621 Acute and chronic respiratory failure with hypoxia: Secondary | ICD-10-CM | POA: Diagnosis not present

## 2021-01-13 DIAGNOSIS — I5043 Acute on chronic combined systolic (congestive) and diastolic (congestive) heart failure: Secondary | ICD-10-CM | POA: Diagnosis not present

## 2021-01-13 DIAGNOSIS — I252 Old myocardial infarction: Secondary | ICD-10-CM | POA: Diagnosis not present

## 2021-01-13 DIAGNOSIS — I2510 Atherosclerotic heart disease of native coronary artery without angina pectoris: Secondary | ICD-10-CM | POA: Diagnosis not present

## 2021-01-13 DIAGNOSIS — E1122 Type 2 diabetes mellitus with diabetic chronic kidney disease: Secondary | ICD-10-CM | POA: Diagnosis not present

## 2021-01-13 DIAGNOSIS — J441 Chronic obstructive pulmonary disease with (acute) exacerbation: Secondary | ICD-10-CM | POA: Diagnosis not present

## 2021-01-13 DIAGNOSIS — Z930 Tracheostomy status: Secondary | ICD-10-CM | POA: Diagnosis not present

## 2021-01-13 DIAGNOSIS — Z720 Tobacco use: Secondary | ICD-10-CM | POA: Diagnosis not present

## 2021-01-13 DIAGNOSIS — N1832 Chronic kidney disease, stage 3b: Secondary | ICD-10-CM | POA: Diagnosis not present

## 2021-01-13 DIAGNOSIS — E785 Hyperlipidemia, unspecified: Secondary | ICD-10-CM | POA: Diagnosis not present

## 2021-01-13 DIAGNOSIS — E876 Hypokalemia: Secondary | ICD-10-CM | POA: Diagnosis not present

## 2021-01-13 DIAGNOSIS — K219 Gastro-esophageal reflux disease without esophagitis: Secondary | ICD-10-CM | POA: Diagnosis not present

## 2021-01-19 ENCOUNTER — Telehealth: Payer: Self-pay | Admitting: *Deleted

## 2021-01-19 NOTE — Telephone Encounter (Signed)
Dr Percival Spanish received labs from [facility] lab BMET and magnesium done and reviewed  Per Dr Percival Spanish labs stable  Left message to call back

## 2021-01-20 DIAGNOSIS — E1122 Type 2 diabetes mellitus with diabetic chronic kidney disease: Secondary | ICD-10-CM | POA: Diagnosis not present

## 2021-01-20 DIAGNOSIS — I5043 Acute on chronic combined systolic (congestive) and diastolic (congestive) heart failure: Secondary | ICD-10-CM | POA: Diagnosis not present

## 2021-01-20 DIAGNOSIS — I130 Hypertensive heart and chronic kidney disease with heart failure and stage 1 through stage 4 chronic kidney disease, or unspecified chronic kidney disease: Secondary | ICD-10-CM | POA: Diagnosis not present

## 2021-01-20 DIAGNOSIS — J441 Chronic obstructive pulmonary disease with (acute) exacerbation: Secondary | ICD-10-CM | POA: Diagnosis not present

## 2021-01-20 DIAGNOSIS — N1832 Chronic kidney disease, stage 3b: Secondary | ICD-10-CM | POA: Diagnosis not present

## 2021-01-20 DIAGNOSIS — J9621 Acute and chronic respiratory failure with hypoxia: Secondary | ICD-10-CM | POA: Diagnosis not present

## 2021-01-20 NOTE — Telephone Encounter (Signed)
Advised patient, verbalized understanding  

## 2021-01-28 DIAGNOSIS — N1832 Chronic kidney disease, stage 3b: Secondary | ICD-10-CM | POA: Diagnosis not present

## 2021-01-28 DIAGNOSIS — I5043 Acute on chronic combined systolic (congestive) and diastolic (congestive) heart failure: Secondary | ICD-10-CM | POA: Diagnosis not present

## 2021-01-28 DIAGNOSIS — I130 Hypertensive heart and chronic kidney disease with heart failure and stage 1 through stage 4 chronic kidney disease, or unspecified chronic kidney disease: Secondary | ICD-10-CM | POA: Diagnosis not present

## 2021-01-28 DIAGNOSIS — E1122 Type 2 diabetes mellitus with diabetic chronic kidney disease: Secondary | ICD-10-CM | POA: Diagnosis not present

## 2021-01-28 DIAGNOSIS — J441 Chronic obstructive pulmonary disease with (acute) exacerbation: Secondary | ICD-10-CM | POA: Diagnosis not present

## 2021-01-28 DIAGNOSIS — J9621 Acute and chronic respiratory failure with hypoxia: Secondary | ICD-10-CM | POA: Diagnosis not present

## 2021-01-30 NOTE — Progress Notes (Signed)
I fixed this chart to reflect telemedicine visit.  Thank you

## 2021-02-03 DIAGNOSIS — E119 Type 2 diabetes mellitus without complications: Secondary | ICD-10-CM | POA: Diagnosis not present

## 2021-02-04 DIAGNOSIS — N1832 Chronic kidney disease, stage 3b: Secondary | ICD-10-CM | POA: Diagnosis not present

## 2021-02-04 DIAGNOSIS — J9621 Acute and chronic respiratory failure with hypoxia: Secondary | ICD-10-CM | POA: Diagnosis not present

## 2021-02-04 DIAGNOSIS — J441 Chronic obstructive pulmonary disease with (acute) exacerbation: Secondary | ICD-10-CM | POA: Diagnosis not present

## 2021-02-04 DIAGNOSIS — I5043 Acute on chronic combined systolic (congestive) and diastolic (congestive) heart failure: Secondary | ICD-10-CM | POA: Diagnosis not present

## 2021-02-04 DIAGNOSIS — E1122 Type 2 diabetes mellitus with diabetic chronic kidney disease: Secondary | ICD-10-CM | POA: Diagnosis not present

## 2021-02-04 DIAGNOSIS — I130 Hypertensive heart and chronic kidney disease with heart failure and stage 1 through stage 4 chronic kidney disease, or unspecified chronic kidney disease: Secondary | ICD-10-CM | POA: Diagnosis not present

## 2021-02-09 NOTE — Progress Notes (Signed)
I believe I have fixed the patient's telemedicine visit.

## 2021-02-10 DIAGNOSIS — I10 Essential (primary) hypertension: Secondary | ICD-10-CM | POA: Diagnosis not present

## 2021-02-10 DIAGNOSIS — F17210 Nicotine dependence, cigarettes, uncomplicated: Secondary | ICD-10-CM | POA: Diagnosis not present

## 2021-02-10 DIAGNOSIS — E1165 Type 2 diabetes mellitus with hyperglycemia: Secondary | ICD-10-CM | POA: Diagnosis not present

## 2021-02-10 DIAGNOSIS — Z6841 Body Mass Index (BMI) 40.0 and over, adult: Secondary | ICD-10-CM | POA: Diagnosis not present

## 2021-02-10 DIAGNOSIS — I82409 Acute embolism and thrombosis of unspecified deep veins of unspecified lower extremity: Secondary | ICD-10-CM | POA: Diagnosis not present

## 2021-02-10 DIAGNOSIS — L409 Psoriasis, unspecified: Secondary | ICD-10-CM | POA: Diagnosis not present

## 2021-02-10 DIAGNOSIS — Z299 Encounter for prophylactic measures, unspecified: Secondary | ICD-10-CM | POA: Diagnosis not present

## 2021-02-10 DIAGNOSIS — R030 Elevated blood-pressure reading, without diagnosis of hypertension: Secondary | ICD-10-CM | POA: Diagnosis not present

## 2021-02-11 DIAGNOSIS — I5043 Acute on chronic combined systolic (congestive) and diastolic (congestive) heart failure: Secondary | ICD-10-CM | POA: Diagnosis not present

## 2021-02-11 DIAGNOSIS — N1832 Chronic kidney disease, stage 3b: Secondary | ICD-10-CM | POA: Diagnosis not present

## 2021-02-11 DIAGNOSIS — E1122 Type 2 diabetes mellitus with diabetic chronic kidney disease: Secondary | ICD-10-CM | POA: Diagnosis not present

## 2021-02-11 DIAGNOSIS — J9621 Acute and chronic respiratory failure with hypoxia: Secondary | ICD-10-CM | POA: Diagnosis not present

## 2021-02-11 DIAGNOSIS — I130 Hypertensive heart and chronic kidney disease with heart failure and stage 1 through stage 4 chronic kidney disease, or unspecified chronic kidney disease: Secondary | ICD-10-CM | POA: Diagnosis not present

## 2021-02-11 DIAGNOSIS — J441 Chronic obstructive pulmonary disease with (acute) exacerbation: Secondary | ICD-10-CM | POA: Diagnosis not present

## 2021-02-18 DIAGNOSIS — I480 Paroxysmal atrial fibrillation: Secondary | ICD-10-CM | POA: Insufficient documentation

## 2021-02-18 NOTE — Progress Notes (Deleted)
Cardiology Office Note   Date:  02/18/2021   ID:  Theresa [name], DOB [birth year], MRN [id number]  PCP:  Monico Blitz, MD  Cardiologist:   [name], MD   No chief complaint on file.     History of Present Illness: Theresa [name] is a 55 y.o. female who presents for follow up of diastolic HF.  She has a history of two-vessel CABG with stenting prior to her CABG performed in [place] with CABG performed in [place] ([place] at [facility] in 2007.  She was hospitalized in May 2017 for acute on chronic hypoxic respiratory failure multifactorial in etiology due to acute on chronic diastolic heart failure, pneumonia, and COPD exacerbation.   She was hospitalized for an acute inferior STEMI in February 2017 and underwent drug-eluting stent placement in the distal SVG to the PDA.  She also has a history of diabetes, hypertension, hyperlipidemia, lupus anticoagulant with DVT and is on chronic anticoagulant therapy.  July 2019 she had chest pain.    She was managed medically.  She had a mean pulmonary pressure of 40.  She had acute on chronic HF with an EF of 50%.   She had PEA arrest due to dislodged tracheostomy in 10/2018.     She was admitted again on 12/05/2020 :  She had acute on chronic respiratory failure with hypoxia due to COPD and CHF exacerbation.  Acute on chronic combined CHF.  IV diuresis. Treated with Lasix 80 mg p.o. twice daily then decreased to once daily. She was discharged home with torsemide 80 mg p.o. twice daily and metolazone 5 mg Monday and Thursday.  She was net negative 10.3 liters with discharge weight of 208. Dry weight noted to be 206-207 lbs. Echo 12/06/2020 EF 45-50% with global hypokinesis.   ***   *** Spoke to patient by phone today.  She states she has been feeling nauseated without much of an appetite.  She continues to smoke per nursing staff when asked about smoking.  She states she has not received her oxygen concentrator for her 40% trach collar  as of yet. She states the home health nurse was there recently and tried to obtain labs but was unable to obtain a specimen.  She states her pulse has been running in the 57-59 range.  Her O2 saturation on 4 L nasal O2 is 97% today.  She states her weight today is 208.  She states she is compliant with her torsemide, metolazone, and spironolactone.  She states she is having some pain on the left side of her throat near her trach.  States she is coughing up a small amount of yellow-colored sputum. She has been admitted to the hospital several times since I saw her.  The last time was in [facility].  I reviewed these records for this visit.  She was volume overloaded and diuresed.   I note that during that visit she was taken off of amiodarone.   This was apparently done because she had hypothyroid.  Her Torsemide was changed to 60 mg daily.  Since going home she is not taking 60 mg twice daily.  Unfortunately her weight continues to go up.  I do believe she is good about her salt.  She weighs herself daily.  She tries not to eat salty foods.  Her dry weight is probably around 207 pounds and she is 222.  She started having increasing edema going up her legs.  She starting to have  a little "gurgling" at night.  She is not describing new PND or orthopnea.  She is not having any new chest pressure, neck or arm discomfort.  She is not having any palpitations, presyncope or syncope.  She is chronically on 4 or 5 L of oxygen.    Past Medical History:  Diagnosis Date   Acute kidney failure with lesion of tubular necrosis (HCC)    Acute systolic heart failure (HCC)    CAD (coronary artery disease)    a. s/p prior PCI. b. CABG 2007 at [facility] in [place] 2007. c. inferior STEMI 10/2015 s/p DES to dSVG-PDA.   Cardiac arrest [facility])    Cervical cancer (HCC)    Chronic diastolic CHF (congestive heart failure) (HCC)    Chronic respiratory failure (HCC)    s/p tracheostomy 2002   Chronic RUQ pain     COPD (chronic obstructive pulmonary disease) (HCC)    Diabetes mellitus ([place])    DVT (deep venous thrombosis) ([place])    Endometriosis    History of gallstones 01/2016   seen on Ultrasound   History of HIDA scan 11/2016   normal   HTN (hypertension)    Hyperlipidemia    Lupus anticoagulant disorder (HCC)    on coumadin   Morbid obesity (HCC)    Psoriasis    ST elevation (STEMI) myocardial infarction involving right coronary artery ([place]) 10/29/15   stent to VG to PDA   Toe fracture, right 03/29/2018   Tracheostomy in place [facility]), chronic since 2002 11/03/2015    Past Surgical History:  Procedure Laterality Date   CARDIAC CATHETERIZATION N/A 10/29/2015   Procedure: Left Heart Cath and Cors/Grafts Angiography;  Surgeon: Burnell Blanks, MD;  Location: [place] [facility];  Service: Cardiovascular;  Laterality: N/A;   CARDIAC CATHETERIZATION  10/29/2015   Procedure: Coronary Stent Intervention;  Surgeon: Burnell Blanks, MD;  Location: [place] [facility];  Service: Cardiovascular;;   CAROTID STENT     CESAREAN SECTION WITH BILATERAL TUBAL LIGATION     CORONARY ARTERY BYPASS GRAFT  2007   2V   IR GASTROSTOMY TUBE MOD SED  11/13/2018   RIGHT/LEFT HEART CATH AND CORONARY/GRAFT ANGIOGRAPHY N/A 03/24/2018   Procedure: RIGHT/LEFT HEART CATH AND CORONARY/GRAFT ANGIOGRAPHY;  Surgeon: Belva Crome, MD;  Location: [place] [facility];  Service: Cardiovascular;  Laterality: N/A;   TRACHEOSTOMY       Current Outpatient Medications  Medication Sig Dispense Refill   albuterol (PROVENTIL) (2.5 MG/3ML) 0.083% nebulizer solution Take 3 mLs (2.5 mg total) by nebulization every 4 (four) hours as needed for wheezing.     atorvastatin (LIPITOR) 20 MG tablet Take 20 mg by mouth daily.     diazepam (VALIUM) 5 MG tablet Take 5 mg by mouth daily as needed.     digoxin (LANOXIN) 0.125 MG tablet Take 1 tablet (125 mcg total) by mouth daily. Started 10/31/2020 30 tablet 6   gabapentin (NEURONTIN) 300  MG capsule Take 3 capsules (900 mg total) by mouth at bedtime.     insulin degludec (TRESIBA FLEXTOUCH) 100 UNIT/ML FlexTouch Pen Inject 30 Units into the skin at bedtime. (Patient taking differently: Inject 40 Units into the skin at bedtime.)     levothyroxine (SYNTHROID) 50 MCG tablet Take 1 tablet by mouth daily.     LORazepam (ATIVAN) 0.5 MG tablet Take 0.5 mg by mouth as needed for anxiety.     metolazone (ZAROXOLYN) 5 MG tablet Take 1 tablet (5 mg total)  by mouth 2 (two) times a week.     metoprolol succinate (TOPROL-XL) 25 MG 24 hr tablet Take 25 mg by mouth daily.     mometasone (ELOCON) 0.1 % ointment Apply 1 application topically daily as needed.     nitroGLYCERIN (NITROSTAT) 0.4 MG SL tablet Place 0.4 mg under the tongue every 5 (five) minutes as needed for chest pain.     NOVOLOG FLEXPEN 100 UNIT/ML FlexPen Inject 1-10 Units into the skin 3 (three) times daily before meals.     pantoprazole (PROTONIX) 20 MG tablet Take 20 mg by mouth daily.     polyethylene glycol (MIRALAX / GLYCOLAX) 17 g packet Take 17 g by mouth daily as needed for mild constipation. 14 each 0   potassium chloride SA (KLOR-CON) 20 MEQ tablet Take 3 tablets (60 mEq total) by mouth daily. 90 tablet 1   sertraline (ZOLOFT) 50 MG tablet Take 50 mg by mouth at bedtime.      spironolactone (ALDACTONE) 25 MG tablet Take 1 tablet (25 mg total) by mouth 2 (two) times daily. 60 tablet 1   tamsulosin (FLOMAX) 0.4 MG CAPS capsule Take 0.4 mg by mouth daily.     torsemide 40 MG TABS Take 80 mg by mouth 2 (two) times daily. 240 tablet 1   traMADol (ULTRAM) 50 MG tablet Take 1 tablet (50 mg total) by mouth 3 (three) times daily as needed for moderate pain. 30 tablet 0   warfarin (COUMADIN) 2.5 MG tablet Take 0.5-1 tablets (1.25-2.5 mg total) by mouth See admin instructions. 2.5mg  MWF and 1.25mg  every other day. (Patient taking differently: Take 1.125-2.5 mg by mouth See admin instructions. 5mg  on Monday & Friday and 2.5mg  all other  days - MANAGED BY PCP)     No current facility-administered medications for this visit.    Allergies:   Penicillins, Ciprofloxacin, and Ibuprofen    ROS:  Please see the history of present illness.   Otherwise, review of systems are positive for ***.   All other systems are reviewed and negative.    PHYSICAL EXAM: VS:  There were no vitals taken for this visit. , BMI There is no height or weight on file to calculate BMI. GENERAL:  Well appearing NECK:  No jugular venous distention, waveform within normal limits, carotid upstroke brisk and symmetric, no bruits, no thyromegaly LUNGS:  Clear to auscultation bilaterally CHEST:  Unremarkable HEART:  PMI not displaced or sustained,S1 and S2 within normal limits, no S3, no S4, no clicks, no rubs, *** murmurs ABD:  Flat, positive bowel sounds normal in frequency in pitch, no bruits, no rebound, no guarding, no midline pulsatile mass, no hepatomegaly, no splenomegaly EXT:  2 plus pulses throughout, no edema, no cyanosis no clubbing     ***GEN:  No distress NECK:  No jugular venous distention at 90 degrees, waveform within normal limits, carotid upstroke brisk and symmetric, no bruits, no thyromegaly, tracheostomy in place LYMPHATICS:  No cervical adenopathy LUNGS:  Clear to auscultation bilaterally BACK:  No CVA tenderness CHEST:  Unremarkable HEART:  S1 and S2 within normal limits, no S3, no S4, no clicks, no rubs, no murmurs ABD:  Positive bowel sounds normal in frequency in pitch, no bruits, no rebound, no guarding, unable to assess midline mass or bruit with the patient seated.,  Morbidly obese EXT:  2 plus pulses throughout, moderate edema to the knees bilaterally, no cyanosis no clubbing SKIN:  No rashes no nodules   EKG:  EKG is ***  ordered today. Sinus rhythm, rate ***, poor anterior R wave progression, QTC slightly prolonged, nonspecific lateral T wave changes not different from previous.  Compared to previous she is not in  atrial fibrillation.   Recent Labs: 12/09/2020: ALT 19 12/10/2020: TSH 7.254 12/11/2020: Magnesium 1.9 12/12/2020: B Natriuretic Peptide 279.0; BUN 45; Creatinine, Ser 1.45; Hemoglobin 14.6; Platelets 283; Potassium 3.3; Sodium 141    Lipid Panel    Component Value Date/Time   CHOL 221 (H) 05/05/2020 0928   TRIG 165 (H) 05/05/2020 0928   HDL 71 05/05/2020 0928   CHOLHDL 3.1 05/05/2020 0928   CHOLHDL 3.8 03/24/2018 0451   VLDL 20 03/24/2018 0451   LDLCALC 121 (H) 05/05/2020 0928      Wt Readings from Last 3 Encounters:  12/25/20 208 lb (94.3 kg)  12/12/20 208 lb 12.4 oz (94.7 kg)  11/11/20 218 lb 12.8 oz (99.2 kg)      Other studies Reviewed: Additional studies/ records that were reviewed today include: Extensive review of multiple [facility] records. Review of the above records demonstrates: See elsewhere   ASSESSMENT AND PLAN:  Chronic Mixed CHF:   ***  Today she needs Zaroxolyn 5 mg for 2 days only.  She is going to double her potassium.  I am going to order a Bumex to be run on Friday.  She knows to keep her feet elevated.  We are going to see her back in clinic in 2 weeks.  If she gets worse rather than better she needs to present again to the emergency room.  Hypertension:   ***  Her blood pressure is controlled in the context of managing her cardiomyopathy.   CAD: Hx of CABG.    *** She has been having no ongoing chest discomfort.  No change in therapy.   Hx of DVT with Lupus Anticoagulant Syndrome:   ***  She tolerates anticoagulation.  No change in therapy.  Atrial fib: ***  She has no symptomatic paroxysms.  No change in therapy.  She remains off of the amiodarone because of hypothyroidism and is currently on thyroid replacement therapy.    Current medicines are reviewed at length with the patient today.  The patient does not have concerns regarding medicines.  The following changes have been made:  ***  Labs/ tests ordered today include: ***  No orders of  the defined types were placed in this encounter.    Disposition:   FU with me in *** months.     Signed, [name], MD  02/18/2021 9:43 PM    [facility]

## 2021-02-19 ENCOUNTER — Ambulatory Visit: Payer: Medicare Other | Admitting: Cardiology

## 2021-03-13 DIAGNOSIS — R030 Elevated blood-pressure reading, without diagnosis of hypertension: Secondary | ICD-10-CM | POA: Diagnosis not present

## 2021-03-13 DIAGNOSIS — E1165 Type 2 diabetes mellitus with hyperglycemia: Secondary | ICD-10-CM | POA: Diagnosis not present

## 2021-03-13 DIAGNOSIS — Z299 Encounter for prophylactic measures, unspecified: Secondary | ICD-10-CM | POA: Diagnosis not present

## 2021-03-13 DIAGNOSIS — L409 Psoriasis, unspecified: Secondary | ICD-10-CM | POA: Diagnosis not present

## 2021-03-13 DIAGNOSIS — M549 Dorsalgia, unspecified: Secondary | ICD-10-CM | POA: Diagnosis not present

## 2021-03-13 DIAGNOSIS — L0390 Cellulitis, unspecified: Secondary | ICD-10-CM | POA: Diagnosis not present

## 2021-03-13 DIAGNOSIS — I82409 Acute embolism and thrombosis of unspecified deep veins of unspecified lower extremity: Secondary | ICD-10-CM | POA: Diagnosis not present

## 2021-03-13 DIAGNOSIS — F17210 Nicotine dependence, cigarettes, uncomplicated: Secondary | ICD-10-CM | POA: Diagnosis not present

## 2021-03-13 DIAGNOSIS — Z6841 Body Mass Index (BMI) 40.0 and over, adult: Secondary | ICD-10-CM | POA: Diagnosis not present

## 2021-03-16 DIAGNOSIS — I509 Heart failure, unspecified: Secondary | ICD-10-CM | POA: Diagnosis not present

## 2021-03-16 DIAGNOSIS — R4182 Altered mental status, unspecified: Secondary | ICD-10-CM | POA: Diagnosis not present

## 2021-03-16 DIAGNOSIS — I517 Cardiomegaly: Secondary | ICD-10-CM | POA: Diagnosis not present

## 2021-03-16 DIAGNOSIS — T380X5D Adverse effect of glucocorticoids and synthetic analogues, subsequent encounter: Secondary | ICD-10-CM | POA: Diagnosis not present

## 2021-03-16 DIAGNOSIS — E162 Hypoglycemia, unspecified: Secondary | ICD-10-CM | POA: Diagnosis not present

## 2021-03-16 DIAGNOSIS — E161 Other hypoglycemia: Secondary | ICD-10-CM | POA: Diagnosis not present

## 2021-03-16 DIAGNOSIS — Z8701 Personal history of pneumonia (recurrent): Secondary | ICD-10-CM | POA: Diagnosis not present

## 2021-03-16 DIAGNOSIS — N189 Chronic kidney disease, unspecified: Secondary | ICD-10-CM | POA: Diagnosis present

## 2021-03-16 DIAGNOSIS — I130 Hypertensive heart and chronic kidney disease with heart failure and stage 1 through stage 4 chronic kidney disease, or unspecified chronic kidney disease: Secondary | ICD-10-CM | POA: Diagnosis not present

## 2021-03-16 DIAGNOSIS — K8020 Calculus of gallbladder without cholecystitis without obstruction: Secondary | ICD-10-CM | POA: Diagnosis not present

## 2021-03-16 DIAGNOSIS — Z880 Allergy status to penicillin: Secondary | ICD-10-CM | POA: Diagnosis not present

## 2021-03-16 DIAGNOSIS — J9610 Chronic respiratory failure, unspecified whether with hypoxia or hypercapnia: Secondary | ICD-10-CM | POA: Diagnosis not present

## 2021-03-16 DIAGNOSIS — Z6841 Body Mass Index (BMI) 40.0 and over, adult: Secondary | ICD-10-CM | POA: Diagnosis not present

## 2021-03-16 DIAGNOSIS — J9621 Acute and chronic respiratory failure with hypoxia: Secondary | ICD-10-CM | POA: Diagnosis not present

## 2021-03-16 DIAGNOSIS — R197 Diarrhea, unspecified: Secondary | ICD-10-CM | POA: Diagnosis not present

## 2021-03-16 DIAGNOSIS — R0602 Shortness of breath: Secondary | ICD-10-CM | POA: Diagnosis not present

## 2021-03-16 DIAGNOSIS — J929 Pleural plaque without asbestos: Secondary | ICD-10-CM | POA: Diagnosis not present

## 2021-03-16 DIAGNOSIS — R791 Abnormal coagulation profile: Secondary | ICD-10-CM | POA: Diagnosis present

## 2021-03-16 DIAGNOSIS — E876 Hypokalemia: Secondary | ICD-10-CM | POA: Diagnosis not present

## 2021-03-16 DIAGNOSIS — Z79890 Hormone replacement therapy (postmenopausal): Secondary | ICD-10-CM | POA: Diagnosis not present

## 2021-03-16 DIAGNOSIS — E039 Hypothyroidism, unspecified: Secondary | ICD-10-CM | POA: Diagnosis not present

## 2021-03-16 DIAGNOSIS — R892 Abnormal level of other drugs, medicaments and biological substances in specimens from other organs, systems and tissues: Secondary | ICD-10-CM | POA: Diagnosis not present

## 2021-03-16 DIAGNOSIS — E1140 Type 2 diabetes mellitus with diabetic neuropathy, unspecified: Secondary | ICD-10-CM | POA: Diagnosis present

## 2021-03-16 DIAGNOSIS — Z79899 Other long term (current) drug therapy: Secondary | ICD-10-CM | POA: Diagnosis not present

## 2021-03-16 DIAGNOSIS — J9611 Chronic respiratory failure with hypoxia: Secondary | ICD-10-CM | POA: Diagnosis not present

## 2021-03-16 DIAGNOSIS — D72829 Elevated white blood cell count, unspecified: Secondary | ICD-10-CM | POA: Diagnosis not present

## 2021-03-16 DIAGNOSIS — E11628 Type 2 diabetes mellitus with other skin complications: Secondary | ICD-10-CM | POA: Diagnosis present

## 2021-03-16 DIAGNOSIS — J189 Pneumonia, unspecified organism: Secondary | ICD-10-CM | POA: Diagnosis not present

## 2021-03-16 DIAGNOSIS — L03116 Cellulitis of left lower limb: Secondary | ICD-10-CM | POA: Diagnosis present

## 2021-03-16 DIAGNOSIS — N179 Acute kidney failure, unspecified: Secondary | ICD-10-CM | POA: Diagnosis not present

## 2021-03-16 DIAGNOSIS — Z20822 Contact with and (suspected) exposure to covid-19: Secondary | ICD-10-CM | POA: Diagnosis present

## 2021-03-16 DIAGNOSIS — R531 Weakness: Secondary | ICD-10-CM | POA: Diagnosis not present

## 2021-03-16 DIAGNOSIS — E119 Type 2 diabetes mellitus without complications: Secondary | ICD-10-CM | POA: Diagnosis not present

## 2021-03-16 DIAGNOSIS — R0902 Hypoxemia: Secondary | ICD-10-CM | POA: Diagnosis not present

## 2021-03-16 DIAGNOSIS — Z930 Tracheostomy status: Secondary | ICD-10-CM | POA: Diagnosis not present

## 2021-03-16 DIAGNOSIS — I5023 Acute on chronic systolic (congestive) heart failure: Secondary | ICD-10-CM | POA: Diagnosis present

## 2021-03-16 DIAGNOSIS — R609 Edema, unspecified: Secondary | ICD-10-CM | POA: Diagnosis not present

## 2021-03-16 DIAGNOSIS — J811 Chronic pulmonary edema: Secondary | ICD-10-CM | POA: Diagnosis not present

## 2021-03-16 DIAGNOSIS — I480 Paroxysmal atrial fibrillation: Secondary | ICD-10-CM | POA: Diagnosis present

## 2021-03-16 DIAGNOSIS — E785 Hyperlipidemia, unspecified: Secondary | ICD-10-CM | POA: Diagnosis present

## 2021-03-16 DIAGNOSIS — E1122 Type 2 diabetes mellitus with diabetic chronic kidney disease: Secondary | ICD-10-CM | POA: Diagnosis present

## 2021-03-16 DIAGNOSIS — R52 Pain, unspecified: Secondary | ICD-10-CM | POA: Diagnosis not present

## 2021-03-16 DIAGNOSIS — Z794 Long term (current) use of insulin: Secondary | ICD-10-CM | POA: Diagnosis not present

## 2021-03-16 DIAGNOSIS — J90 Pleural effusion, not elsewhere classified: Secondary | ICD-10-CM | POA: Diagnosis not present

## 2021-03-16 DIAGNOSIS — R7989 Other specified abnormal findings of blood chemistry: Secondary | ICD-10-CM | POA: Diagnosis not present

## 2021-03-16 DIAGNOSIS — L03115 Cellulitis of right lower limb: Secondary | ICD-10-CM | POA: Diagnosis present

## 2021-03-16 DIAGNOSIS — I4892 Unspecified atrial flutter: Secondary | ICD-10-CM | POA: Diagnosis not present

## 2021-03-16 DIAGNOSIS — F17200 Nicotine dependence, unspecified, uncomplicated: Secondary | ICD-10-CM | POA: Diagnosis present

## 2021-03-16 DIAGNOSIS — D3500 Benign neoplasm of unspecified adrenal gland: Secondary | ICD-10-CM | POA: Diagnosis not present

## 2021-03-16 DIAGNOSIS — Z9119 Patient's noncompliance with other medical treatment and regimen: Secondary | ICD-10-CM | POA: Diagnosis not present

## 2021-03-16 DIAGNOSIS — J449 Chronic obstructive pulmonary disease, unspecified: Secondary | ICD-10-CM | POA: Diagnosis present

## 2021-03-19 ENCOUNTER — Ambulatory Visit: Payer: Medicare Other | Admitting: Family Medicine

## 2021-04-09 DIAGNOSIS — Z299 Encounter for prophylactic measures, unspecified: Secondary | ICD-10-CM | POA: Diagnosis not present

## 2021-04-09 DIAGNOSIS — Z6841 Body Mass Index (BMI) 40.0 and over, adult: Secondary | ICD-10-CM | POA: Diagnosis not present

## 2021-04-09 DIAGNOSIS — I5042 Chronic combined systolic (congestive) and diastolic (congestive) heart failure: Secondary | ICD-10-CM | POA: Diagnosis not present

## 2021-04-09 DIAGNOSIS — J9611 Chronic respiratory failure with hypoxia: Secondary | ICD-10-CM | POA: Diagnosis not present

## 2021-04-09 DIAGNOSIS — Z930 Tracheostomy status: Secondary | ICD-10-CM | POA: Diagnosis not present

## 2021-04-09 DIAGNOSIS — F17210 Nicotine dependence, cigarettes, uncomplicated: Secondary | ICD-10-CM | POA: Diagnosis not present

## 2021-04-09 DIAGNOSIS — E1165 Type 2 diabetes mellitus with hyperglycemia: Secondary | ICD-10-CM | POA: Diagnosis not present

## 2021-04-14 ENCOUNTER — Encounter: Payer: Self-pay | Admitting: *Deleted

## 2021-04-15 ENCOUNTER — Telehealth: Payer: Self-pay | Admitting: Family Medicine

## 2021-04-15 NOTE — Telephone Encounter (Signed)
  Patient Consent for Virtual Visit        Theresa [name] has provided verbal consent on 04/15/2021 for a virtual visit (video or telephone).   CONSENT FOR VIRTUAL VISIT FOR:  Theresa [name]  By participating in this virtual visit I agree to the following:  I hereby voluntarily request, consent and authorize [facility] and its employed or contracted physicians, physician assistants, nurse practitioners or other licensed health care professionals (the Practitioner), to provide me with telemedicine health care services (the "Services") as deemed necessary by the treating Practitioner. I acknowledge and consent to receive the Services by the Practitioner via telemedicine. I understand that the telemedicine visit will involve communicating with the Practitioner through live audiovisual communication technology and the disclosure of certain medical information by electronic transmission. I acknowledge that I have been given the opportunity to request an in-person assessment or other available alternative prior to the telemedicine visit and am voluntarily participating in the telemedicine visit.  I understand that I have the right to withhold or withdraw my consent to the use of telemedicine in the course of my care at any time, without affecting my right to future care or treatment, and that the Practitioner or I may terminate the telemedicine visit at any time. I understand that I have the right to inspect all information obtained and/or recorded in the course of the telemedicine visit and may receive copies of available information for a reasonable fee.  I understand that some of the potential risks of receiving the Services via telemedicine include:  Delay or interruption in medical evaluation due to technological equipment failure or disruption; Information transmitted may not be sufficient (e.g. poor resolution of images) to allow for appropriate medical decision making by the Practitioner;  and/or  In rare instances, security protocols could fail, causing a breach of personal health information.  Furthermore, I acknowledge that it is my responsibility to provide information about my medical history, conditions and care that is complete and accurate to the best of my ability. I acknowledge that Practitioner's advice, recommendations, and/or decision may be based on factors not within their control, such as incomplete or inaccurate data provided by me or distortions of diagnostic images or specimens that may result from electronic transmissions. I understand that the practice of medicine is not an exact science and that Practitioner makes no warranties or guarantees regarding treatment outcomes. I acknowledge that a copy of this consent can be made available to me via my patient portal ([facility]), or I can request a printed copy by calling the office of [facility].    I understand that my insurance will be billed for this visit.   I have read or had this consent read to me. I understand the contents of this consent, which adequately explains the benefits and risks of the Services being provided via telemedicine.  I have been provided ample opportunity to ask questions regarding this consent and the Services and have had my questions answered to my satisfaction. I give my informed consent for the services to be provided through the use of telemedicine in my medical care

## 2021-04-16 ENCOUNTER — Telehealth (INDEPENDENT_AMBULATORY_CARE_PROVIDER_SITE_OTHER): Payer: Medicare Other | Admitting: Family Medicine

## 2021-04-16 ENCOUNTER — Encounter: Payer: Self-pay | Admitting: Family Medicine

## 2021-04-16 DIAGNOSIS — I2510 Atherosclerotic heart disease of native coronary artery without angina pectoris: Secondary | ICD-10-CM

## 2021-04-16 NOTE — Progress Notes (Signed)
Virtual Visit via Telephone Note   This visit type was conducted due to national recommendations for restrictions regarding the COVID-19 Pandemic (e.g. social distancing) in an effort to limit this patient's exposure and mitigate transmission in our community.  Due to her co-morbid illnesses, this patient is at least at moderate risk for complications without adequate follow up.  This format is felt to be most appropriate for this patient at this time.  The patient did not have access to video technology/had technical difficulties with video requiring transitioning to audio format only (telephone).  All issues noted in this document were discussed and addressed.  No physical exam could be performed with this format.  Please refer to the patient's chart for her  consent to telehealth for [facility].    Date:  04/16/2021   ID:  Theresa [name], DOB 10-Jul-1966, MRN [id number] The patient was identified using 2 identifiers.  Patient Location: Home Provider Location: Office/Clinic   PCP:  Monico Blitz, MD   [facility] Cardiologist:  [name], MD     Evaluation Performed:  Follow-Up Visit  Chief Complaint: 38-month follow-up   Recent admission to [facility] for heart failure  History of Present Illness:    Theresa [name] is a 55 y.o. female with a history of CAD, chronic diastolic CHF, chronic respiratory failure, COPD, DM 2, HTN, HLD, lupus, morbid obesity, STEMI, cardiac arrest, acute kidney failure lesion of tubular necrosis.  Acute systolic heart failure, DVT, lupus anticoagulant syndrome, tobacco abuse, chronic respiratory failure on 4 L.  Chronic tracheostomy tracheostomy.  Previously seen by Dr. Percival Spanish 10/28/2020 office visit. He noted she had a recent admission to [facility] due to volume overload and was diuresed.  During that visit she was taken off amiodarone due to hypothyroid.  Torsemide was changed to 60 mg daily.  She noticed since going home she had  not been taking 60 mg twice daily.  Her weight continued to increase.  She stated her dry weight was likely around 207.  Weight was 222 on that visit.  She had been having increasing edema in her legs.  Chronically on 4 L of oxygen at home.  He ordered Zaroxolyn 5 mg for 2 days only.  She was going to double her potassium.  Recent admission 12/05/2020 : Dx :  Acute on chronic respiratory failure with hypoxia due to COPD and CHF exacerbation.  Acute on chronic combined CHF.  IV diuresis. Treated with Lasix 80 mg p.o. twice daily then decreased to once daily. She was discharged home with torsemide 80 mg p.o. twice daily and metolazone 5 mg Monday and Thursday.  To continue spironolactone. She was net negative 10.3 liters with discharge weight of 208. Dry weight noted to be 206-207 lbs. Echo 12/06/2020 EF 45-50% with global hypokinesis.   Spoke to patient by phone today.  She states she has been feeling nauseated without much of an appetite.  She continues to smoke per nursing staff when asked about smoking.  She states she has not received her oxygen concentrator for her 40% trach collar as of yet. She states the home health nurse was there recently and tried to obtain labs but was unable to obtain a specimen.  She states her pulse has been running in the 57-59 range.  Her O2 saturation on 4 L nasal O2 is 97% today.  She states her weight today is 208.  She states she is compliant with her torsemide, metolazone,  and spironolactone.  She states she is having some pain on the left side of her throat near her trach.  States she is coughing up a small amount of yellow-colored sputum.   Recent admission to [facility] for acute congestive heart failure on 03/16/2021.  She presented to the emergency department with weakness, shortness of breath and was thought to have acute on chronic congestive heart failure with known history obstructive sleep apnea, COPD.  She was admitted to the hospital and diuresed and medications  were adjusted.  She responded well to management.  She was noted to be at her baseline.  She was still requiring 10 L of oxygen via trach mask at home.  Per admission note she was practically bedbound but could get up with help of a walker to some degree.  CT scan noted pulmonary edema, cholelithiasis, bilateral adrenal adenomas, aortic atherosclerosis, enlarged pulmonic trunk indicative of pulmonary arterial hypertension.  She was eventually discharged after hospital stay of 14 days.     I spoke to the patient by phone today.  She states she is doing much better since being discharged from the hospital.  Her discharge weight was 208.  She is currently taking torsemide 60 mg p.o. twice daily.  She denies any DOE or SOB currently.  She is down to 4 L by trach mask since discharge from the hospital.  She denies any significant edema.  She states she had some recent bleeding and her primary care provider told her to stop her Coumadin for few days.  Apparently plans are to restart Coumadin after she is rechecked in his office.    The patient does not have symptoms concerning for COVID-19 infection (fever, chills, cough, or new shortness of breath).    Past Medical History:  Diagnosis Date   Acute kidney failure with lesion of tubular necrosis (HCC)    Acute systolic heart failure (HCC)    CAD (coronary artery disease)    a. s/p prior PCI. b. CABG 2007 at [facility] in [place] 2007. c. inferior STEMI 10/2015 s/p DES to dSVG-PDA.   Cardiac arrest [facility])    Cervical cancer (HCC)    Chronic diastolic CHF (congestive heart failure) (HCC)    Chronic respiratory failure (HCC)    s/p tracheostomy 2002   Chronic RUQ pain    COPD (chronic obstructive pulmonary disease) (HCC)    Diabetes mellitus ([place])    DVT (deep venous thrombosis) ([place])    Endometriosis    History of gallstones 01/2016   seen on Ultrasound   History of HIDA scan 11/2016   normal   HTN (hypertension)    Hyperlipidemia     Lupus anticoagulant disorder (HCC)    on coumadin   Morbid obesity (HCC)    Psoriasis    ST elevation (STEMI) myocardial infarction involving right coronary artery ([place]) 10/29/15   stent to VG to PDA   Toe fracture, right 03/29/2018   Tracheostomy in place [facility]), chronic since 2002 11/03/2015   Past Surgical History:  Procedure Laterality Date   CARDIAC CATHETERIZATION N/A 10/29/2015   Procedure: Left Heart Cath and Cors/Grafts Angiography;  Surgeon: Burnell Blanks, MD;  Location: [place] [facility];  Service: Cardiovascular;  Laterality: N/A;   CARDIAC CATHETERIZATION  10/29/2015   Procedure: Coronary Stent Intervention;  Surgeon: Burnell Blanks, MD;  Location: [place] [facility];  Service: Cardiovascular;;   CAROTID STENT     CESAREAN SECTION WITH BILATERAL TUBAL LIGATION  CORONARY ARTERY BYPASS GRAFT  2007   2V   IR GASTROSTOMY TUBE MOD SED  11/13/2018   RIGHT/LEFT HEART CATH AND CORONARY/GRAFT ANGIOGRAPHY N/A 03/24/2018   Procedure: RIGHT/LEFT HEART CATH AND CORONARY/GRAFT ANGIOGRAPHY;  Surgeon: Belva Crome, MD;  Location: [place] [facility];  Service: Cardiovascular;  Laterality: N/A;   TRACHEOSTOMY       Current Meds  Medication Sig   albuterol (PROVENTIL) (2.5 MG/3ML) 0.083% nebulizer solution Take 3 mLs (2.5 mg total) by nebulization every 4 (four) hours as needed for wheezing.   atorvastatin (LIPITOR) 20 MG tablet Take 20 mg by mouth daily.   gabapentin (NEURONTIN) 300 MG capsule Take 3 capsules (900 mg total) by mouth at bedtime.   insulin degludec (TRESIBA FLEXTOUCH) 100 UNIT/ML FlexTouch Pen Inject 30 Units into the skin at bedtime. (Patient taking differently: Inject 40 Units into the skin at bedtime.)   levothyroxine (SYNTHROID) 50 MCG tablet Take 1 tablet by mouth daily.   LORazepam (ATIVAN) 0.5 MG tablet Take 0.5 mg by mouth as needed for anxiety.   metolazone (ZAROXOLYN) 5 MG tablet Take 1 tablet (5 mg total) by mouth 2 (two) times a week.   metoprolol  succinate (TOPROL-XL) 25 MG 24 hr tablet Take 25 mg by mouth daily.   mometasone (ELOCON) 0.1 % ointment Apply 1 application topically daily as needed.   nitroGLYCERIN (NITROSTAT) 0.4 MG SL tablet Place 0.4 mg under the tongue every 5 (five) minutes x 3 doses as needed for chest pain.   NOVOLOG FLEXPEN 100 UNIT/ML FlexPen Inject 1-10 Units into the skin 3 (three) times daily before meals.   pantoprazole (PROTONIX) 20 MG tablet Take 20 mg by mouth daily.   polyethylene glycol (MIRALAX / GLYCOLAX) 17 g packet Take 17 g by mouth daily as needed for mild constipation.   potassium chloride SA (KLOR-CON) 20 MEQ tablet Take 3 tablets (60 mEq total) by mouth daily. (Patient taking differently: Take 40 mEq by mouth daily.)   sertraline (ZOLOFT) 50 MG tablet Take 50 mg by mouth at bedtime.    spironolactone (ALDACTONE) 25 MG tablet Take 1 tablet (25 mg total) by mouth 2 (two) times daily. (Patient taking differently: Take 25 mg by mouth daily.)   spironolactone (ALDACTONE) 25 MG tablet Take 25 mg by mouth daily.   tamsulosin (FLOMAX) 0.4 MG CAPS capsule Take 0.4 mg by mouth daily.   torsemide (DEMADEX) 20 MG tablet Take 60 mg by mouth 2 (two) times daily.   traMADol (ULTRAM) 50 MG tablet Take 1 tablet (50 mg total) by mouth 3 (three) times daily as needed for moderate pain.   warfarin (COUMADIN) 2.5 MG tablet Take 0.5-1 tablets (1.25-2.5 mg total) by mouth See admin instructions. 2.5mg  MWF and 1.25mg  every other day. (Patient taking differently: Take 1.125-2.5 mg by mouth See admin instructions. 5mg  on Monday & Friday and 2.5mg  all other days - MANAGED BY PCP)     Allergies:   Penicillins, Ciprofloxacin, and Ibuprofen   Social History   Tobacco Use   Smoking status: Every Day    Packs/day: 0.75    Types: Cigarettes    Start date: 10/23/1978   Smokeless tobacco: Never   Tobacco comments:    1/2 pack - 1 pack - trying to cut back   Vaping Use   Vaping Use: Never used  Substance Use Topics    Alcohol use: No    Alcohol/week: 0.0 standard drinks   Drug use: No     Family Hx:  The patient's family history includes Diabetes in her father and mother; Hypertension in her father and mother. There is no history of Colon cancer or Colon polyps.  ROS:   Please see the history of present illness.    All other systems reviewed and are negative.   Prior CV studies:   The following studies were reviewed today:  Echocardiogram 12/06/2020  1. Left ventricular ejection fraction, by estimation, is 45 to 50%. The left ventricle has mildly decreased function. The left ventricle demonstrates global hypokinesis. There is mild left ventricular hypertrophy. Left ventricular diastolic parameters were normal. 2. Right ventricular systolic function is normal. The right ventricular size is normal. There is moderately elevated pulmonary artery systolic pressure. The estimated right ventricular systolic pressure is 25.4 mmHg. 3. The mitral valve is normal in structure. No evidence of mitral valve regurgitation. No evidence of mitral stenosis. 4. The aortic valve is normal in structure. Aortic valve regurgitation is not visualized. No aortic stenosis is present. 5. The inferior vena cava is normal in size with greater than 50% respiratory variability, suggesting right atrial pressure of 3 mmHg. Comparison(s): No significant change from prior study. Prior images reviewed side by side.  Echocardiogram 07/02/2020 1. Difficult asssessment of LVEF in setting of aflutter as well as limited visualization. . Left ventricular ejection fraction, by estimation, is 50%. The left ventricle has mildly decreased function. The left ventricle demonstrates global hypokinesis. There is mild left ventricular hypertrophy. Left ventricular diastolic parameters are indeterminate. 2. Right ventricular systolic function was not well visualized. The right ventricular size is not well visualized. 3. Left atrial size was  mildly dilated. 4. The mitral valve is normal in structure. No evidence of mitral valve regurgitation. No evidence of mitral stenosis. 5. The aortic valve is tricuspid. Aortic valve regurgitation is not visualized. No aortic stenosis is present. 6. The inferior vena cava is normal in size with greater than 50% respiratory variability, suggesting right atrial pressure of 3 mmHg.   03/24/2018 RIGHT/LEFT HEART CATH AND CORONARY/GRAFT ANGIOGRAPHY   Conclusion  Severe native coronary artery disease with total occlusion of the proximal LAD, total occlusion of the proximal RCA, and patent circumflex with patent proximal stent with eccentric 50% proximal narrowing.  Distal obtuse marginal branches are severely and diffusely diseased. Bypass graft failure with total occlusion of SVG to RCA. Patent LIMA to LAD. Right coronary territory is supplied by collaterals from LAD and circumflex. Left ventricular systolic dysfunction with EF 35 to 45%.  Mid to distal inferior wall akinesis.  Inferobasal and inferoapical wall severely hypokinetic.  Left ventricular end-diastolic pressure elevated at 19 mmHg Moderate to severe pulmonary hypertension   RECOMMENDATIONS:   Resume Coumadin therapy overlap with heparin Continue Plavix Management of pulmonary status to maintain adequate oxygenation which may help decrease severity of pulmonary hypertension.     Recommend to resume Warfarin, at currently prescribed dose and frequency, on today.  Recommend concurrent antiplatelet therapy of Clopidogrel 75mg  daily for Indefinite  Diagnostic Dominance: Right     Labs/Other Tests and Data Reviewed:    EKG:   EKG October 28, 2020 normal sinus rhythm rate of 72, anterior infarct, age undetermined, marked ST abnormality, possible lateral subendocardial injury.    Recent Labs: 12/09/2020: ALT 19 12/10/2020: TSH 7.254 12/11/2020: Magnesium 1.9 12/12/2020: B Natriuretic Peptide 279.0; BUN 45; Creatinine, Ser 1.45;  Hemoglobin 14.6; Platelets 283; Potassium 3.3; Sodium 141   Recent Lipid Panel Lab Results  Component Value Date/Time   CHOL 221 (H) 05/05/2020 09:28  AM   TRIG 165 (H) 05/05/2020 09:28 AM   HDL 71 05/05/2020 09:28 AM   CHOLHDL 3.1 05/05/2020 09:28 AM   CHOLHDL 3.8 03/24/2018 04:51 AM   LDLCALC 121 (H) 05/05/2020 09:28 AM    Wt Readings from Last 3 Encounters:  04/16/21 208 lb (94.3 kg)  12/25/20 208 lb (94.3 kg)  12/12/20 208 lb 12.4 oz (94.7 kg)     Risk Assessment/Calculations:      Objective:    Vital Signs:  BP 130/70   Ht 4\' 10"  (1.473 m)   Wt 208 lb (94.3 kg)   SpO2 98%   BMI 43.47 kg/m    VITAL SIGNS:  reviewed  ASSESSMENT & PLAN:     1. Chronic combined systolic (congestive) and diastolic (congestive) heart failure (HCC) Recent hospital admission on 03/16/2021 to [facility] for acute on chronic combined systolic diastolic heart failure.  She was diuresed and did well.  BNP was initially 20,000 953 and eventually was down to 13,388.  Her troponins were 88-84-91.  She was discharged with a discharge weight of 208.  Continue torsemide 60 mg p.o. twice daily, spironolactone 25 mg p.o. daily, potassium 60 mEq daily, metolazone 5 mg twice weekly, Toprol-XL 25 mg daily  2. Essential hypertension Blood pressure well controlled at 130/70 on virtual visit today.    3. CAD in native artery No anginal or exertional symptoms or nitroglycerin use.  Continue nitroglycerin as needed.  Continue atorvastatin 20 mg daily.  4. History of DVT (deep vein thrombosis) No DVT or PE-like symptoms.  Continue Coumadin as directed.   5. Atrial fibrillation, unspecified type [facility]) She states she had some recent bleeding and primary care provider has stopped her Coumadin for now until further follow-up by him.  Continue Toprol-XL 25 mg daily.    COVID-19 Education: The signs and symptoms of COVID-19 were discussed with the patient and how to seek care for testing (follow up with PCP  or arrange E-visit).  The importance of social distancing was discussed today.  Time:   Today, I have spent 10 minutes with the patient with telehealth technology discussing the above problems.     Medication Adjustments/Labs and Tests Ordered: Current medicines are reviewed at length with the patient today.  Concerns regarding medicines are outlined above.   Tests Ordered: No orders of the defined types were placed in this encounter.   Medication Changes: No orders of the defined types were placed in this encounter.   Follow Up: Dr. Percival Spanish or APP 6 months  Signed, Verta Ellen, NP  04/16/2021 2:22 PM    [place]

## 2021-04-16 NOTE — Patient Instructions (Addendum)
Medication Instructions:  Your physician recommends that you continue on your current medications as directed. Please refer to the Current Medication list given to you today.  Labwork: none  Testing/Procedures: none  Follow-Up: Your physician recommends that you schedule a follow-up appointment in: 6 months with Dr. Percival Spanish  Any Other Special Instructions Will Be Listed Below (If Applicable).  If you need a refill on your cardiac medications before your next appointment, please call your pharmacy.

## 2021-05-12 DIAGNOSIS — Z930 Tracheostomy status: Secondary | ICD-10-CM | POA: Diagnosis not present

## 2021-05-12 DIAGNOSIS — J449 Chronic obstructive pulmonary disease, unspecified: Secondary | ICD-10-CM | POA: Diagnosis not present

## 2021-05-12 DIAGNOSIS — I509 Heart failure, unspecified: Secondary | ICD-10-CM | POA: Diagnosis not present

## 2021-05-12 DIAGNOSIS — I5040 Unspecified combined systolic (congestive) and diastolic (congestive) heart failure: Secondary | ICD-10-CM | POA: Diagnosis not present

## 2021-05-16 DIAGNOSIS — J449 Chronic obstructive pulmonary disease, unspecified: Secondary | ICD-10-CM | POA: Diagnosis not present

## 2021-05-18 DIAGNOSIS — M6281 Muscle weakness (generalized): Secondary | ICD-10-CM | POA: Diagnosis not present

## 2021-05-28 DIAGNOSIS — J449 Chronic obstructive pulmonary disease, unspecified: Secondary | ICD-10-CM | POA: Diagnosis not present

## 2021-05-28 DIAGNOSIS — Z930 Tracheostomy status: Secondary | ICD-10-CM | POA: Diagnosis not present

## 2021-06-02 DIAGNOSIS — J449 Chronic obstructive pulmonary disease, unspecified: Secondary | ICD-10-CM | POA: Diagnosis not present

## 2021-06-02 DIAGNOSIS — I5022 Chronic systolic (congestive) heart failure: Secondary | ICD-10-CM | POA: Diagnosis not present

## 2021-06-04 DIAGNOSIS — Z930 Tracheostomy status: Secondary | ICD-10-CM | POA: Diagnosis not present

## 2021-06-04 DIAGNOSIS — J449 Chronic obstructive pulmonary disease, unspecified: Secondary | ICD-10-CM | POA: Diagnosis not present

## 2021-06-09 DIAGNOSIS — Z930 Tracheostomy status: Secondary | ICD-10-CM | POA: Diagnosis not present

## 2021-06-11 DIAGNOSIS — I509 Heart failure, unspecified: Secondary | ICD-10-CM | POA: Diagnosis not present

## 2021-06-11 DIAGNOSIS — J449 Chronic obstructive pulmonary disease, unspecified: Secondary | ICD-10-CM | POA: Diagnosis not present

## 2021-06-11 DIAGNOSIS — I5040 Unspecified combined systolic (congestive) and diastolic (congestive) heart failure: Secondary | ICD-10-CM | POA: Diagnosis not present

## 2021-06-11 DIAGNOSIS — Z930 Tracheostomy status: Secondary | ICD-10-CM | POA: Diagnosis not present

## 2021-06-12 DIAGNOSIS — I4892 Unspecified atrial flutter: Secondary | ICD-10-CM | POA: Diagnosis not present

## 2021-06-12 DIAGNOSIS — E7800 Pure hypercholesterolemia, unspecified: Secondary | ICD-10-CM | POA: Diagnosis not present

## 2021-06-12 DIAGNOSIS — G4733 Obstructive sleep apnea (adult) (pediatric): Secondary | ICD-10-CM | POA: Diagnosis not present

## 2021-06-12 DIAGNOSIS — I2510 Atherosclerotic heart disease of native coronary artery without angina pectoris: Secondary | ICD-10-CM | POA: Diagnosis not present

## 2021-06-12 DIAGNOSIS — L4050 Arthropathic psoriasis, unspecified: Secondary | ICD-10-CM | POA: Diagnosis not present

## 2021-06-12 DIAGNOSIS — M329 Systemic lupus erythematosus, unspecified: Secondary | ICD-10-CM | POA: Diagnosis not present

## 2021-06-12 DIAGNOSIS — E1142 Type 2 diabetes mellitus with diabetic polyneuropathy: Secondary | ICD-10-CM | POA: Diagnosis not present

## 2021-06-12 DIAGNOSIS — J449 Chronic obstructive pulmonary disease, unspecified: Secondary | ICD-10-CM | POA: Diagnosis not present

## 2021-06-12 DIAGNOSIS — J9611 Chronic respiratory failure with hypoxia: Secondary | ICD-10-CM | POA: Diagnosis not present

## 2021-06-12 DIAGNOSIS — L89626 Pressure-induced deep tissue damage of left heel: Secondary | ICD-10-CM | POA: Diagnosis not present

## 2021-06-12 DIAGNOSIS — I429 Cardiomyopathy, unspecified: Secondary | ICD-10-CM | POA: Diagnosis not present

## 2021-06-12 DIAGNOSIS — K219 Gastro-esophageal reflux disease without esophagitis: Secondary | ICD-10-CM | POA: Diagnosis not present

## 2021-06-12 DIAGNOSIS — R251 Tremor, unspecified: Secondary | ICD-10-CM | POA: Diagnosis not present

## 2021-06-12 DIAGNOSIS — I110 Hypertensive heart disease with heart failure: Secondary | ICD-10-CM | POA: Diagnosis not present

## 2021-06-12 DIAGNOSIS — I480 Paroxysmal atrial fibrillation: Secondary | ICD-10-CM | POA: Diagnosis not present

## 2021-06-12 DIAGNOSIS — Z7901 Long term (current) use of anticoagulants: Secondary | ICD-10-CM | POA: Diagnosis not present

## 2021-06-12 DIAGNOSIS — F32A Depression, unspecified: Secondary | ICD-10-CM | POA: Diagnosis not present

## 2021-06-12 DIAGNOSIS — E1165 Type 2 diabetes mellitus with hyperglycemia: Secondary | ICD-10-CM | POA: Diagnosis not present

## 2021-06-12 DIAGNOSIS — T462X5D Adverse effect of other antidysrhythmic drugs, subsequent encounter: Secondary | ICD-10-CM | POA: Diagnosis not present

## 2021-06-12 DIAGNOSIS — Z9181 History of falling: Secondary | ICD-10-CM | POA: Diagnosis not present

## 2021-06-12 DIAGNOSIS — Z7984 Long term (current) use of oral hypoglycemic drugs: Secondary | ICD-10-CM | POA: Diagnosis not present

## 2021-06-12 DIAGNOSIS — E032 Hypothyroidism due to medicaments and other exogenous substances: Secondary | ICD-10-CM | POA: Diagnosis not present

## 2021-06-12 DIAGNOSIS — I5042 Chronic combined systolic (congestive) and diastolic (congestive) heart failure: Secondary | ICD-10-CM | POA: Diagnosis not present

## 2021-06-15 DIAGNOSIS — J449 Chronic obstructive pulmonary disease, unspecified: Secondary | ICD-10-CM | POA: Diagnosis not present

## 2021-06-17 DIAGNOSIS — M6281 Muscle weakness (generalized): Secondary | ICD-10-CM | POA: Diagnosis not present

## 2021-06-17 DIAGNOSIS — I5042 Chronic combined systolic (congestive) and diastolic (congestive) heart failure: Secondary | ICD-10-CM | POA: Diagnosis not present

## 2021-06-17 DIAGNOSIS — Z7984 Long term (current) use of oral hypoglycemic drugs: Secondary | ICD-10-CM | POA: Diagnosis not present

## 2021-06-17 DIAGNOSIS — E7800 Pure hypercholesterolemia, unspecified: Secondary | ICD-10-CM | POA: Diagnosis not present

## 2021-06-17 DIAGNOSIS — L89626 Pressure-induced deep tissue damage of left heel: Secondary | ICD-10-CM | POA: Diagnosis not present

## 2021-06-17 DIAGNOSIS — Z9181 History of falling: Secondary | ICD-10-CM | POA: Diagnosis not present

## 2021-06-17 DIAGNOSIS — R251 Tremor, unspecified: Secondary | ICD-10-CM | POA: Diagnosis not present

## 2021-06-17 DIAGNOSIS — J449 Chronic obstructive pulmonary disease, unspecified: Secondary | ICD-10-CM | POA: Diagnosis not present

## 2021-06-17 DIAGNOSIS — K219 Gastro-esophageal reflux disease without esophagitis: Secondary | ICD-10-CM | POA: Diagnosis not present

## 2021-06-17 DIAGNOSIS — T462X5D Adverse effect of other antidysrhythmic drugs, subsequent encounter: Secondary | ICD-10-CM | POA: Diagnosis not present

## 2021-06-17 DIAGNOSIS — F32A Depression, unspecified: Secondary | ICD-10-CM | POA: Diagnosis not present

## 2021-06-17 DIAGNOSIS — I429 Cardiomyopathy, unspecified: Secondary | ICD-10-CM | POA: Diagnosis not present

## 2021-06-17 DIAGNOSIS — E032 Hypothyroidism due to medicaments and other exogenous substances: Secondary | ICD-10-CM | POA: Diagnosis not present

## 2021-06-17 DIAGNOSIS — Z7901 Long term (current) use of anticoagulants: Secondary | ICD-10-CM | POA: Diagnosis not present

## 2021-06-17 DIAGNOSIS — G4733 Obstructive sleep apnea (adult) (pediatric): Secondary | ICD-10-CM | POA: Diagnosis not present

## 2021-06-17 DIAGNOSIS — I4892 Unspecified atrial flutter: Secondary | ICD-10-CM | POA: Diagnosis not present

## 2021-06-17 DIAGNOSIS — I110 Hypertensive heart disease with heart failure: Secondary | ICD-10-CM | POA: Diagnosis not present

## 2021-06-17 DIAGNOSIS — I480 Paroxysmal atrial fibrillation: Secondary | ICD-10-CM | POA: Diagnosis not present

## 2021-06-17 DIAGNOSIS — J9611 Chronic respiratory failure with hypoxia: Secondary | ICD-10-CM | POA: Diagnosis not present

## 2021-06-17 DIAGNOSIS — E1142 Type 2 diabetes mellitus with diabetic polyneuropathy: Secondary | ICD-10-CM | POA: Diagnosis not present

## 2021-06-17 DIAGNOSIS — I2510 Atherosclerotic heart disease of native coronary artery without angina pectoris: Secondary | ICD-10-CM | POA: Diagnosis not present

## 2021-06-17 DIAGNOSIS — L4050 Arthropathic psoriasis, unspecified: Secondary | ICD-10-CM | POA: Diagnosis not present

## 2021-06-17 DIAGNOSIS — E1165 Type 2 diabetes mellitus with hyperglycemia: Secondary | ICD-10-CM | POA: Diagnosis not present

## 2021-06-17 DIAGNOSIS — M329 Systemic lupus erythematosus, unspecified: Secondary | ICD-10-CM | POA: Diagnosis not present

## 2021-06-18 DIAGNOSIS — E032 Hypothyroidism due to medicaments and other exogenous substances: Secondary | ICD-10-CM | POA: Diagnosis not present

## 2021-06-18 DIAGNOSIS — M329 Systemic lupus erythematosus, unspecified: Secondary | ICD-10-CM | POA: Diagnosis not present

## 2021-06-18 DIAGNOSIS — G4733 Obstructive sleep apnea (adult) (pediatric): Secondary | ICD-10-CM | POA: Diagnosis not present

## 2021-06-18 DIAGNOSIS — K219 Gastro-esophageal reflux disease without esophagitis: Secondary | ICD-10-CM | POA: Diagnosis not present

## 2021-06-18 DIAGNOSIS — E1165 Type 2 diabetes mellitus with hyperglycemia: Secondary | ICD-10-CM | POA: Diagnosis not present

## 2021-06-18 DIAGNOSIS — L89626 Pressure-induced deep tissue damage of left heel: Secondary | ICD-10-CM | POA: Diagnosis not present

## 2021-06-18 DIAGNOSIS — Z7901 Long term (current) use of anticoagulants: Secondary | ICD-10-CM | POA: Diagnosis not present

## 2021-06-18 DIAGNOSIS — I2510 Atherosclerotic heart disease of native coronary artery without angina pectoris: Secondary | ICD-10-CM | POA: Diagnosis not present

## 2021-06-18 DIAGNOSIS — I110 Hypertensive heart disease with heart failure: Secondary | ICD-10-CM | POA: Diagnosis not present

## 2021-06-18 DIAGNOSIS — I4892 Unspecified atrial flutter: Secondary | ICD-10-CM | POA: Diagnosis not present

## 2021-06-18 DIAGNOSIS — Z7984 Long term (current) use of oral hypoglycemic drugs: Secondary | ICD-10-CM | POA: Diagnosis not present

## 2021-06-18 DIAGNOSIS — I5042 Chronic combined systolic (congestive) and diastolic (congestive) heart failure: Secondary | ICD-10-CM | POA: Diagnosis not present

## 2021-06-18 DIAGNOSIS — Z9181 History of falling: Secondary | ICD-10-CM | POA: Diagnosis not present

## 2021-06-18 DIAGNOSIS — J9611 Chronic respiratory failure with hypoxia: Secondary | ICD-10-CM | POA: Diagnosis not present

## 2021-06-18 DIAGNOSIS — L4050 Arthropathic psoriasis, unspecified: Secondary | ICD-10-CM | POA: Diagnosis not present

## 2021-06-18 DIAGNOSIS — I480 Paroxysmal atrial fibrillation: Secondary | ICD-10-CM | POA: Diagnosis not present

## 2021-06-18 DIAGNOSIS — J449 Chronic obstructive pulmonary disease, unspecified: Secondary | ICD-10-CM | POA: Diagnosis not present

## 2021-06-18 DIAGNOSIS — T462X5D Adverse effect of other antidysrhythmic drugs, subsequent encounter: Secondary | ICD-10-CM | POA: Diagnosis not present

## 2021-06-18 DIAGNOSIS — F32A Depression, unspecified: Secondary | ICD-10-CM | POA: Diagnosis not present

## 2021-06-18 DIAGNOSIS — R251 Tremor, unspecified: Secondary | ICD-10-CM | POA: Diagnosis not present

## 2021-06-18 DIAGNOSIS — I429 Cardiomyopathy, unspecified: Secondary | ICD-10-CM | POA: Diagnosis not present

## 2021-06-18 DIAGNOSIS — E7800 Pure hypercholesterolemia, unspecified: Secondary | ICD-10-CM | POA: Diagnosis not present

## 2021-06-18 DIAGNOSIS — E1142 Type 2 diabetes mellitus with diabetic polyneuropathy: Secondary | ICD-10-CM | POA: Diagnosis not present

## 2021-06-19 DIAGNOSIS — J449 Chronic obstructive pulmonary disease, unspecified: Secondary | ICD-10-CM | POA: Diagnosis not present

## 2021-06-19 DIAGNOSIS — F32A Depression, unspecified: Secondary | ICD-10-CM | POA: Diagnosis not present

## 2021-06-19 DIAGNOSIS — J9611 Chronic respiratory failure with hypoxia: Secondary | ICD-10-CM | POA: Diagnosis not present

## 2021-06-19 DIAGNOSIS — Z7901 Long term (current) use of anticoagulants: Secondary | ICD-10-CM | POA: Diagnosis not present

## 2021-06-19 DIAGNOSIS — L89626 Pressure-induced deep tissue damage of left heel: Secondary | ICD-10-CM | POA: Diagnosis not present

## 2021-06-19 DIAGNOSIS — I2510 Atherosclerotic heart disease of native coronary artery without angina pectoris: Secondary | ICD-10-CM | POA: Diagnosis not present

## 2021-06-19 DIAGNOSIS — G4733 Obstructive sleep apnea (adult) (pediatric): Secondary | ICD-10-CM | POA: Diagnosis not present

## 2021-06-19 DIAGNOSIS — E7800 Pure hypercholesterolemia, unspecified: Secondary | ICD-10-CM | POA: Diagnosis not present

## 2021-06-19 DIAGNOSIS — M329 Systemic lupus erythematosus, unspecified: Secondary | ICD-10-CM | POA: Diagnosis not present

## 2021-06-19 DIAGNOSIS — L4050 Arthropathic psoriasis, unspecified: Secondary | ICD-10-CM | POA: Diagnosis not present

## 2021-06-19 DIAGNOSIS — I429 Cardiomyopathy, unspecified: Secondary | ICD-10-CM | POA: Diagnosis not present

## 2021-06-19 DIAGNOSIS — Z9181 History of falling: Secondary | ICD-10-CM | POA: Diagnosis not present

## 2021-06-19 DIAGNOSIS — I5042 Chronic combined systolic (congestive) and diastolic (congestive) heart failure: Secondary | ICD-10-CM | POA: Diagnosis not present

## 2021-06-19 DIAGNOSIS — T462X5D Adverse effect of other antidysrhythmic drugs, subsequent encounter: Secondary | ICD-10-CM | POA: Diagnosis not present

## 2021-06-19 DIAGNOSIS — I4892 Unspecified atrial flutter: Secondary | ICD-10-CM | POA: Diagnosis not present

## 2021-06-19 DIAGNOSIS — K219 Gastro-esophageal reflux disease without esophagitis: Secondary | ICD-10-CM | POA: Diagnosis not present

## 2021-06-19 DIAGNOSIS — E032 Hypothyroidism due to medicaments and other exogenous substances: Secondary | ICD-10-CM | POA: Diagnosis not present

## 2021-06-19 DIAGNOSIS — E1165 Type 2 diabetes mellitus with hyperglycemia: Secondary | ICD-10-CM | POA: Diagnosis not present

## 2021-06-19 DIAGNOSIS — R251 Tremor, unspecified: Secondary | ICD-10-CM | POA: Diagnosis not present

## 2021-06-19 DIAGNOSIS — I480 Paroxysmal atrial fibrillation: Secondary | ICD-10-CM | POA: Diagnosis not present

## 2021-06-19 DIAGNOSIS — E1142 Type 2 diabetes mellitus with diabetic polyneuropathy: Secondary | ICD-10-CM | POA: Diagnosis not present

## 2021-06-19 DIAGNOSIS — I110 Hypertensive heart disease with heart failure: Secondary | ICD-10-CM | POA: Diagnosis not present

## 2021-06-19 DIAGNOSIS — Z7984 Long term (current) use of oral hypoglycemic drugs: Secondary | ICD-10-CM | POA: Diagnosis not present

## 2021-06-22 DIAGNOSIS — E1142 Type 2 diabetes mellitus with diabetic polyneuropathy: Secondary | ICD-10-CM | POA: Diagnosis not present

## 2021-06-22 DIAGNOSIS — J449 Chronic obstructive pulmonary disease, unspecified: Secondary | ICD-10-CM | POA: Diagnosis not present

## 2021-06-22 DIAGNOSIS — I2510 Atherosclerotic heart disease of native coronary artery without angina pectoris: Secondary | ICD-10-CM | POA: Diagnosis not present

## 2021-06-22 DIAGNOSIS — F32A Depression, unspecified: Secondary | ICD-10-CM | POA: Diagnosis not present

## 2021-06-22 DIAGNOSIS — E7800 Pure hypercholesterolemia, unspecified: Secondary | ICD-10-CM | POA: Diagnosis not present

## 2021-06-22 DIAGNOSIS — Z7984 Long term (current) use of oral hypoglycemic drugs: Secondary | ICD-10-CM | POA: Diagnosis not present

## 2021-06-22 DIAGNOSIS — I5042 Chronic combined systolic (congestive) and diastolic (congestive) heart failure: Secondary | ICD-10-CM | POA: Diagnosis not present

## 2021-06-22 DIAGNOSIS — L89626 Pressure-induced deep tissue damage of left heel: Secondary | ICD-10-CM | POA: Diagnosis not present

## 2021-06-22 DIAGNOSIS — Z9181 History of falling: Secondary | ICD-10-CM | POA: Diagnosis not present

## 2021-06-22 DIAGNOSIS — T462X5D Adverse effect of other antidysrhythmic drugs, subsequent encounter: Secondary | ICD-10-CM | POA: Diagnosis not present

## 2021-06-22 DIAGNOSIS — Z7901 Long term (current) use of anticoagulants: Secondary | ICD-10-CM | POA: Diagnosis not present

## 2021-06-22 DIAGNOSIS — I110 Hypertensive heart disease with heart failure: Secondary | ICD-10-CM | POA: Diagnosis not present

## 2021-06-22 DIAGNOSIS — J9611 Chronic respiratory failure with hypoxia: Secondary | ICD-10-CM | POA: Diagnosis not present

## 2021-06-22 DIAGNOSIS — E1165 Type 2 diabetes mellitus with hyperglycemia: Secondary | ICD-10-CM | POA: Diagnosis not present

## 2021-06-22 DIAGNOSIS — L4050 Arthropathic psoriasis, unspecified: Secondary | ICD-10-CM | POA: Diagnosis not present

## 2021-06-22 DIAGNOSIS — M329 Systemic lupus erythematosus, unspecified: Secondary | ICD-10-CM | POA: Diagnosis not present

## 2021-06-22 DIAGNOSIS — I480 Paroxysmal atrial fibrillation: Secondary | ICD-10-CM | POA: Diagnosis not present

## 2021-06-22 DIAGNOSIS — R251 Tremor, unspecified: Secondary | ICD-10-CM | POA: Diagnosis not present

## 2021-06-22 DIAGNOSIS — I429 Cardiomyopathy, unspecified: Secondary | ICD-10-CM | POA: Diagnosis not present

## 2021-06-22 DIAGNOSIS — K219 Gastro-esophageal reflux disease without esophagitis: Secondary | ICD-10-CM | POA: Diagnosis not present

## 2021-06-22 DIAGNOSIS — I4892 Unspecified atrial flutter: Secondary | ICD-10-CM | POA: Diagnosis not present

## 2021-06-22 DIAGNOSIS — G4733 Obstructive sleep apnea (adult) (pediatric): Secondary | ICD-10-CM | POA: Diagnosis not present

## 2021-06-22 DIAGNOSIS — E032 Hypothyroidism due to medicaments and other exogenous substances: Secondary | ICD-10-CM | POA: Diagnosis not present

## 2021-06-23 DIAGNOSIS — T462X5D Adverse effect of other antidysrhythmic drugs, subsequent encounter: Secondary | ICD-10-CM | POA: Diagnosis not present

## 2021-06-23 DIAGNOSIS — J449 Chronic obstructive pulmonary disease, unspecified: Secondary | ICD-10-CM | POA: Diagnosis not present

## 2021-06-23 DIAGNOSIS — E032 Hypothyroidism due to medicaments and other exogenous substances: Secondary | ICD-10-CM | POA: Diagnosis not present

## 2021-06-23 DIAGNOSIS — F32A Depression, unspecified: Secondary | ICD-10-CM | POA: Diagnosis not present

## 2021-06-23 DIAGNOSIS — L4050 Arthropathic psoriasis, unspecified: Secondary | ICD-10-CM | POA: Diagnosis not present

## 2021-06-23 DIAGNOSIS — I480 Paroxysmal atrial fibrillation: Secondary | ICD-10-CM | POA: Diagnosis not present

## 2021-06-23 DIAGNOSIS — I4892 Unspecified atrial flutter: Secondary | ICD-10-CM | POA: Diagnosis not present

## 2021-06-23 DIAGNOSIS — I5042 Chronic combined systolic (congestive) and diastolic (congestive) heart failure: Secondary | ICD-10-CM | POA: Diagnosis not present

## 2021-06-23 DIAGNOSIS — E1165 Type 2 diabetes mellitus with hyperglycemia: Secondary | ICD-10-CM | POA: Diagnosis not present

## 2021-06-23 DIAGNOSIS — I110 Hypertensive heart disease with heart failure: Secondary | ICD-10-CM | POA: Diagnosis not present

## 2021-06-23 DIAGNOSIS — E1142 Type 2 diabetes mellitus with diabetic polyneuropathy: Secondary | ICD-10-CM | POA: Diagnosis not present

## 2021-06-23 DIAGNOSIS — I2510 Atherosclerotic heart disease of native coronary artery without angina pectoris: Secondary | ICD-10-CM | POA: Diagnosis not present

## 2021-06-23 DIAGNOSIS — M329 Systemic lupus erythematosus, unspecified: Secondary | ICD-10-CM | POA: Diagnosis not present

## 2021-06-23 DIAGNOSIS — L89626 Pressure-induced deep tissue damage of left heel: Secondary | ICD-10-CM | POA: Diagnosis not present

## 2021-06-23 DIAGNOSIS — R251 Tremor, unspecified: Secondary | ICD-10-CM | POA: Diagnosis not present

## 2021-06-23 DIAGNOSIS — I429 Cardiomyopathy, unspecified: Secondary | ICD-10-CM | POA: Diagnosis not present

## 2021-06-23 DIAGNOSIS — Z7901 Long term (current) use of anticoagulants: Secondary | ICD-10-CM | POA: Diagnosis not present

## 2021-06-23 DIAGNOSIS — Z9181 History of falling: Secondary | ICD-10-CM | POA: Diagnosis not present

## 2021-06-23 DIAGNOSIS — K219 Gastro-esophageal reflux disease without esophagitis: Secondary | ICD-10-CM | POA: Diagnosis not present

## 2021-06-23 DIAGNOSIS — J9611 Chronic respiratory failure with hypoxia: Secondary | ICD-10-CM | POA: Diagnosis not present

## 2021-06-23 DIAGNOSIS — Z7984 Long term (current) use of oral hypoglycemic drugs: Secondary | ICD-10-CM | POA: Diagnosis not present

## 2021-06-23 DIAGNOSIS — G4733 Obstructive sleep apnea (adult) (pediatric): Secondary | ICD-10-CM | POA: Diagnosis not present

## 2021-06-23 DIAGNOSIS — E7800 Pure hypercholesterolemia, unspecified: Secondary | ICD-10-CM | POA: Diagnosis not present

## 2021-06-24 DIAGNOSIS — E032 Hypothyroidism due to medicaments and other exogenous substances: Secondary | ICD-10-CM | POA: Diagnosis not present

## 2021-06-24 DIAGNOSIS — I4892 Unspecified atrial flutter: Secondary | ICD-10-CM | POA: Diagnosis not present

## 2021-06-24 DIAGNOSIS — K219 Gastro-esophageal reflux disease without esophagitis: Secondary | ICD-10-CM | POA: Diagnosis not present

## 2021-06-24 DIAGNOSIS — E7800 Pure hypercholesterolemia, unspecified: Secondary | ICD-10-CM | POA: Diagnosis not present

## 2021-06-24 DIAGNOSIS — Z7901 Long term (current) use of anticoagulants: Secondary | ICD-10-CM | POA: Diagnosis not present

## 2021-06-24 DIAGNOSIS — M329 Systemic lupus erythematosus, unspecified: Secondary | ICD-10-CM | POA: Diagnosis not present

## 2021-06-24 DIAGNOSIS — E1142 Type 2 diabetes mellitus with diabetic polyneuropathy: Secondary | ICD-10-CM | POA: Diagnosis not present

## 2021-06-24 DIAGNOSIS — G4733 Obstructive sleep apnea (adult) (pediatric): Secondary | ICD-10-CM | POA: Diagnosis not present

## 2021-06-24 DIAGNOSIS — R251 Tremor, unspecified: Secondary | ICD-10-CM | POA: Diagnosis not present

## 2021-06-24 DIAGNOSIS — E1165 Type 2 diabetes mellitus with hyperglycemia: Secondary | ICD-10-CM | POA: Diagnosis not present

## 2021-06-24 DIAGNOSIS — L89626 Pressure-induced deep tissue damage of left heel: Secondary | ICD-10-CM | POA: Diagnosis not present

## 2021-06-24 DIAGNOSIS — T462X5D Adverse effect of other antidysrhythmic drugs, subsequent encounter: Secondary | ICD-10-CM | POA: Diagnosis not present

## 2021-06-24 DIAGNOSIS — Z9181 History of falling: Secondary | ICD-10-CM | POA: Diagnosis not present

## 2021-06-24 DIAGNOSIS — F32A Depression, unspecified: Secondary | ICD-10-CM | POA: Diagnosis not present

## 2021-06-24 DIAGNOSIS — J9611 Chronic respiratory failure with hypoxia: Secondary | ICD-10-CM | POA: Diagnosis not present

## 2021-06-24 DIAGNOSIS — I480 Paroxysmal atrial fibrillation: Secondary | ICD-10-CM | POA: Diagnosis not present

## 2021-06-24 DIAGNOSIS — I429 Cardiomyopathy, unspecified: Secondary | ICD-10-CM | POA: Diagnosis not present

## 2021-06-24 DIAGNOSIS — J449 Chronic obstructive pulmonary disease, unspecified: Secondary | ICD-10-CM | POA: Diagnosis not present

## 2021-06-24 DIAGNOSIS — I110 Hypertensive heart disease with heart failure: Secondary | ICD-10-CM | POA: Diagnosis not present

## 2021-06-24 DIAGNOSIS — I5042 Chronic combined systolic (congestive) and diastolic (congestive) heart failure: Secondary | ICD-10-CM | POA: Diagnosis not present

## 2021-06-24 DIAGNOSIS — L4050 Arthropathic psoriasis, unspecified: Secondary | ICD-10-CM | POA: Diagnosis not present

## 2021-06-24 DIAGNOSIS — I2510 Atherosclerotic heart disease of native coronary artery without angina pectoris: Secondary | ICD-10-CM | POA: Diagnosis not present

## 2021-06-24 DIAGNOSIS — Z7984 Long term (current) use of oral hypoglycemic drugs: Secondary | ICD-10-CM | POA: Diagnosis not present

## 2021-06-25 DIAGNOSIS — R251 Tremor, unspecified: Secondary | ICD-10-CM | POA: Diagnosis not present

## 2021-06-25 DIAGNOSIS — I480 Paroxysmal atrial fibrillation: Secondary | ICD-10-CM | POA: Diagnosis not present

## 2021-06-25 DIAGNOSIS — L89626 Pressure-induced deep tissue damage of left heel: Secondary | ICD-10-CM | POA: Diagnosis not present

## 2021-06-25 DIAGNOSIS — E7800 Pure hypercholesterolemia, unspecified: Secondary | ICD-10-CM | POA: Diagnosis not present

## 2021-06-25 DIAGNOSIS — E032 Hypothyroidism due to medicaments and other exogenous substances: Secondary | ICD-10-CM | POA: Diagnosis not present

## 2021-06-25 DIAGNOSIS — I2510 Atherosclerotic heart disease of native coronary artery without angina pectoris: Secondary | ICD-10-CM | POA: Diagnosis not present

## 2021-06-25 DIAGNOSIS — M329 Systemic lupus erythematosus, unspecified: Secondary | ICD-10-CM | POA: Diagnosis not present

## 2021-06-25 DIAGNOSIS — I5042 Chronic combined systolic (congestive) and diastolic (congestive) heart failure: Secondary | ICD-10-CM | POA: Diagnosis not present

## 2021-06-25 DIAGNOSIS — I4892 Unspecified atrial flutter: Secondary | ICD-10-CM | POA: Diagnosis not present

## 2021-06-25 DIAGNOSIS — F32A Depression, unspecified: Secondary | ICD-10-CM | POA: Diagnosis not present

## 2021-06-25 DIAGNOSIS — J9611 Chronic respiratory failure with hypoxia: Secondary | ICD-10-CM | POA: Diagnosis not present

## 2021-06-25 DIAGNOSIS — G4733 Obstructive sleep apnea (adult) (pediatric): Secondary | ICD-10-CM | POA: Diagnosis not present

## 2021-06-25 DIAGNOSIS — I429 Cardiomyopathy, unspecified: Secondary | ICD-10-CM | POA: Diagnosis not present

## 2021-06-25 DIAGNOSIS — E1165 Type 2 diabetes mellitus with hyperglycemia: Secondary | ICD-10-CM | POA: Diagnosis not present

## 2021-06-25 DIAGNOSIS — Z9181 History of falling: Secondary | ICD-10-CM | POA: Diagnosis not present

## 2021-06-25 DIAGNOSIS — E1142 Type 2 diabetes mellitus with diabetic polyneuropathy: Secondary | ICD-10-CM | POA: Diagnosis not present

## 2021-06-25 DIAGNOSIS — K219 Gastro-esophageal reflux disease without esophagitis: Secondary | ICD-10-CM | POA: Diagnosis not present

## 2021-06-25 DIAGNOSIS — I110 Hypertensive heart disease with heart failure: Secondary | ICD-10-CM | POA: Diagnosis not present

## 2021-06-25 DIAGNOSIS — Z7984 Long term (current) use of oral hypoglycemic drugs: Secondary | ICD-10-CM | POA: Diagnosis not present

## 2021-06-25 DIAGNOSIS — L4050 Arthropathic psoriasis, unspecified: Secondary | ICD-10-CM | POA: Diagnosis not present

## 2021-06-25 DIAGNOSIS — J449 Chronic obstructive pulmonary disease, unspecified: Secondary | ICD-10-CM | POA: Diagnosis not present

## 2021-06-25 DIAGNOSIS — Z7901 Long term (current) use of anticoagulants: Secondary | ICD-10-CM | POA: Diagnosis not present

## 2021-06-25 DIAGNOSIS — T462X5D Adverse effect of other antidysrhythmic drugs, subsequent encounter: Secondary | ICD-10-CM | POA: Diagnosis not present

## 2021-06-26 DIAGNOSIS — E1142 Type 2 diabetes mellitus with diabetic polyneuropathy: Secondary | ICD-10-CM | POA: Diagnosis not present

## 2021-06-26 DIAGNOSIS — E032 Hypothyroidism due to medicaments and other exogenous substances: Secondary | ICD-10-CM | POA: Diagnosis not present

## 2021-06-26 DIAGNOSIS — F32A Depression, unspecified: Secondary | ICD-10-CM | POA: Diagnosis not present

## 2021-06-26 DIAGNOSIS — Z7984 Long term (current) use of oral hypoglycemic drugs: Secondary | ICD-10-CM | POA: Diagnosis not present

## 2021-06-26 DIAGNOSIS — Z7901 Long term (current) use of anticoagulants: Secondary | ICD-10-CM | POA: Diagnosis not present

## 2021-06-26 DIAGNOSIS — I429 Cardiomyopathy, unspecified: Secondary | ICD-10-CM | POA: Diagnosis not present

## 2021-06-26 DIAGNOSIS — R251 Tremor, unspecified: Secondary | ICD-10-CM | POA: Diagnosis not present

## 2021-06-26 DIAGNOSIS — I480 Paroxysmal atrial fibrillation: Secondary | ICD-10-CM | POA: Diagnosis not present

## 2021-06-26 DIAGNOSIS — L4050 Arthropathic psoriasis, unspecified: Secondary | ICD-10-CM | POA: Diagnosis not present

## 2021-06-26 DIAGNOSIS — T462X5D Adverse effect of other antidysrhythmic drugs, subsequent encounter: Secondary | ICD-10-CM | POA: Diagnosis not present

## 2021-06-26 DIAGNOSIS — I5042 Chronic combined systolic (congestive) and diastolic (congestive) heart failure: Secondary | ICD-10-CM | POA: Diagnosis not present

## 2021-06-26 DIAGNOSIS — J9611 Chronic respiratory failure with hypoxia: Secondary | ICD-10-CM | POA: Diagnosis not present

## 2021-06-26 DIAGNOSIS — I110 Hypertensive heart disease with heart failure: Secondary | ICD-10-CM | POA: Diagnosis not present

## 2021-06-26 DIAGNOSIS — G4733 Obstructive sleep apnea (adult) (pediatric): Secondary | ICD-10-CM | POA: Diagnosis not present

## 2021-06-26 DIAGNOSIS — I4892 Unspecified atrial flutter: Secondary | ICD-10-CM | POA: Diagnosis not present

## 2021-06-26 DIAGNOSIS — L89626 Pressure-induced deep tissue damage of left heel: Secondary | ICD-10-CM | POA: Diagnosis not present

## 2021-06-26 DIAGNOSIS — Z9181 History of falling: Secondary | ICD-10-CM | POA: Diagnosis not present

## 2021-06-26 DIAGNOSIS — J449 Chronic obstructive pulmonary disease, unspecified: Secondary | ICD-10-CM | POA: Diagnosis not present

## 2021-06-26 DIAGNOSIS — E7800 Pure hypercholesterolemia, unspecified: Secondary | ICD-10-CM | POA: Diagnosis not present

## 2021-06-26 DIAGNOSIS — I2510 Atherosclerotic heart disease of native coronary artery without angina pectoris: Secondary | ICD-10-CM | POA: Diagnosis not present

## 2021-06-26 DIAGNOSIS — K219 Gastro-esophageal reflux disease without esophagitis: Secondary | ICD-10-CM | POA: Diagnosis not present

## 2021-06-26 DIAGNOSIS — E1165 Type 2 diabetes mellitus with hyperglycemia: Secondary | ICD-10-CM | POA: Diagnosis not present

## 2021-06-26 DIAGNOSIS — M329 Systemic lupus erythematosus, unspecified: Secondary | ICD-10-CM | POA: Diagnosis not present

## 2021-06-27 DIAGNOSIS — M4854XA Collapsed vertebra, not elsewhere classified, thoracic region, initial encounter for fracture: Secondary | ICD-10-CM | POA: Diagnosis not present

## 2021-06-27 DIAGNOSIS — Z930 Tracheostomy status: Secondary | ICD-10-CM | POA: Diagnosis not present

## 2021-06-27 DIAGNOSIS — Z713 Dietary counseling and surveillance: Secondary | ICD-10-CM | POA: Diagnosis not present

## 2021-06-27 DIAGNOSIS — M79673 Pain in unspecified foot: Secondary | ICD-10-CM | POA: Diagnosis not present

## 2021-06-27 DIAGNOSIS — I480 Paroxysmal atrial fibrillation: Secondary | ICD-10-CM | POA: Diagnosis not present

## 2021-06-27 DIAGNOSIS — R601 Generalized edema: Secondary | ICD-10-CM | POA: Diagnosis not present

## 2021-06-27 DIAGNOSIS — E039 Hypothyroidism, unspecified: Secondary | ICD-10-CM | POA: Diagnosis not present

## 2021-06-27 DIAGNOSIS — I214 Non-ST elevation (NSTEMI) myocardial infarction: Secondary | ICD-10-CM | POA: Diagnosis not present

## 2021-06-27 DIAGNOSIS — M6281 Muscle weakness (generalized): Secondary | ICD-10-CM | POA: Diagnosis not present

## 2021-06-27 DIAGNOSIS — N184 Chronic kidney disease, stage 4 (severe): Secondary | ICD-10-CM | POA: Diagnosis not present

## 2021-06-27 DIAGNOSIS — I4811 Longstanding persistent atrial fibrillation: Secondary | ICD-10-CM | POA: Diagnosis not present

## 2021-06-27 DIAGNOSIS — L89609 Pressure ulcer of unspecified heel, unspecified stage: Secondary | ICD-10-CM | POA: Diagnosis not present

## 2021-06-27 DIAGNOSIS — I472 Ventricular tachycardia, unspecified: Secondary | ICD-10-CM | POA: Diagnosis not present

## 2021-06-27 DIAGNOSIS — I509 Heart failure, unspecified: Secondary | ICD-10-CM | POA: Diagnosis not present

## 2021-06-27 DIAGNOSIS — I493 Ventricular premature depolarization: Secondary | ICD-10-CM | POA: Diagnosis not present

## 2021-06-27 DIAGNOSIS — L89152 Pressure ulcer of sacral region, stage 2: Secondary | ICD-10-CM | POA: Diagnosis not present

## 2021-06-27 DIAGNOSIS — S91302D Unspecified open wound, left foot, subsequent encounter: Secondary | ICD-10-CM | POA: Diagnosis not present

## 2021-06-27 DIAGNOSIS — Z7901 Long term (current) use of anticoagulants: Secondary | ICD-10-CM | POA: Diagnosis not present

## 2021-06-27 DIAGNOSIS — R059 Cough, unspecified: Secondary | ICD-10-CM | POA: Diagnosis not present

## 2021-06-27 DIAGNOSIS — J9611 Chronic respiratory failure with hypoxia: Secondary | ICD-10-CM | POA: Diagnosis not present

## 2021-06-27 DIAGNOSIS — G9341 Metabolic encephalopathy: Secondary | ICD-10-CM | POA: Diagnosis not present

## 2021-06-27 DIAGNOSIS — L89302 Pressure ulcer of unspecified buttock, stage 2: Secondary | ICD-10-CM | POA: Diagnosis not present

## 2021-06-27 DIAGNOSIS — I959 Hypotension, unspecified: Secondary | ICD-10-CM | POA: Diagnosis not present

## 2021-06-27 DIAGNOSIS — Z66 Do not resuscitate: Secondary | ICD-10-CM | POA: Diagnosis not present

## 2021-06-27 DIAGNOSIS — R000 Tachycardia, unspecified: Secondary | ICD-10-CM | POA: Diagnosis not present

## 2021-06-27 DIAGNOSIS — J323 Chronic sphenoidal sinusitis: Secondary | ICD-10-CM | POA: Diagnosis not present

## 2021-06-27 DIAGNOSIS — E1140 Type 2 diabetes mellitus with diabetic neuropathy, unspecified: Secondary | ICD-10-CM | POA: Diagnosis not present

## 2021-06-27 DIAGNOSIS — K6289 Other specified diseases of anus and rectum: Secondary | ICD-10-CM | POA: Diagnosis not present

## 2021-06-27 DIAGNOSIS — J449 Chronic obstructive pulmonary disease, unspecified: Secondary | ICD-10-CM | POA: Diagnosis not present

## 2021-06-27 DIAGNOSIS — R188 Other ascites: Secondary | ICD-10-CM | POA: Diagnosis not present

## 2021-06-27 DIAGNOSIS — L89629 Pressure ulcer of left heel, unspecified stage: Secondary | ICD-10-CM | POA: Diagnosis not present

## 2021-06-27 DIAGNOSIS — Z9981 Dependence on supplemental oxygen: Secondary | ICD-10-CM | POA: Diagnosis not present

## 2021-06-27 DIAGNOSIS — I5022 Chronic systolic (congestive) heart failure: Secondary | ICD-10-CM | POA: Diagnosis not present

## 2021-06-27 DIAGNOSIS — L97429 Non-pressure chronic ulcer of left heel and midfoot with unspecified severity: Secondary | ICD-10-CM | POA: Diagnosis not present

## 2021-06-27 DIAGNOSIS — I4891 Unspecified atrial fibrillation: Secondary | ICD-10-CM | POA: Diagnosis not present

## 2021-06-27 DIAGNOSIS — I482 Chronic atrial fibrillation, unspecified: Secondary | ICD-10-CM | POA: Diagnosis not present

## 2021-06-27 DIAGNOSIS — I081 Rheumatic disorders of both mitral and tricuspid valves: Secondary | ICD-10-CM | POA: Diagnosis not present

## 2021-06-27 DIAGNOSIS — J9621 Acute and chronic respiratory failure with hypoxia: Secondary | ICD-10-CM | POA: Diagnosis not present

## 2021-06-27 DIAGNOSIS — I5023 Acute on chronic systolic (congestive) heart failure: Secondary | ICD-10-CM | POA: Diagnosis not present

## 2021-06-27 DIAGNOSIS — R739 Hyperglycemia, unspecified: Secondary | ICD-10-CM | POA: Diagnosis not present

## 2021-06-27 DIAGNOSIS — R570 Cardiogenic shock: Secondary | ICD-10-CM | POA: Diagnosis not present

## 2021-06-27 DIAGNOSIS — E1159 Type 2 diabetes mellitus with other circulatory complications: Secondary | ICD-10-CM | POA: Diagnosis not present

## 2021-06-27 DIAGNOSIS — R410 Disorientation, unspecified: Secondary | ICD-10-CM | POA: Diagnosis not present

## 2021-06-27 DIAGNOSIS — R531 Weakness: Secondary | ICD-10-CM | POA: Diagnosis not present

## 2021-06-27 DIAGNOSIS — K8020 Calculus of gallbladder without cholecystitis without obstruction: Secondary | ICD-10-CM | POA: Diagnosis not present

## 2021-06-27 DIAGNOSIS — E1165 Type 2 diabetes mellitus with hyperglycemia: Secondary | ICD-10-CM | POA: Diagnosis not present

## 2021-06-27 DIAGNOSIS — D170 Benign lipomatous neoplasm of skin and subcutaneous tissue of head, face and neck: Secondary | ICD-10-CM | POA: Diagnosis not present

## 2021-06-27 DIAGNOSIS — S91301A Unspecified open wound, right foot, initial encounter: Secondary | ICD-10-CM | POA: Diagnosis not present

## 2021-06-27 DIAGNOSIS — I130 Hypertensive heart and chronic kidney disease with heart failure and stage 1 through stage 4 chronic kidney disease, or unspecified chronic kidney disease: Secondary | ICD-10-CM | POA: Diagnosis not present

## 2021-06-27 DIAGNOSIS — I272 Pulmonary hypertension, unspecified: Secondary | ICD-10-CM | POA: Diagnosis not present

## 2021-06-27 DIAGNOSIS — E1151 Type 2 diabetes mellitus with diabetic peripheral angiopathy without gangrene: Secondary | ICD-10-CM | POA: Diagnosis not present

## 2021-06-27 DIAGNOSIS — L89309 Pressure ulcer of unspecified buttock, unspecified stage: Secondary | ICD-10-CM | POA: Diagnosis not present

## 2021-06-27 DIAGNOSIS — E032 Hypothyroidism due to medicaments and other exogenous substances: Secondary | ICD-10-CM | POA: Diagnosis not present

## 2021-06-27 DIAGNOSIS — Z20822 Contact with and (suspected) exposure to covid-19: Secondary | ICD-10-CM | POA: Diagnosis not present

## 2021-06-27 DIAGNOSIS — L89600 Pressure ulcer of unspecified heel, unstageable: Secondary | ICD-10-CM | POA: Diagnosis not present

## 2021-06-27 DIAGNOSIS — E11621 Type 2 diabetes mellitus with foot ulcer: Secondary | ICD-10-CM | POA: Diagnosis not present

## 2021-06-27 DIAGNOSIS — E785 Hyperlipidemia, unspecified: Secondary | ICD-10-CM | POA: Diagnosis not present

## 2021-06-27 DIAGNOSIS — J90 Pleural effusion, not elsewhere classified: Secondary | ICD-10-CM | POA: Diagnosis not present

## 2021-06-27 DIAGNOSIS — I517 Cardiomegaly: Secondary | ICD-10-CM | POA: Diagnosis not present

## 2021-06-27 DIAGNOSIS — T462X5A Adverse effect of other antidysrhythmic drugs, initial encounter: Secondary | ICD-10-CM | POA: Diagnosis not present

## 2021-06-27 DIAGNOSIS — L03116 Cellulitis of left lower limb: Secondary | ICD-10-CM | POA: Diagnosis not present

## 2021-06-27 DIAGNOSIS — E278 Other specified disorders of adrenal gland: Secondary | ICD-10-CM | POA: Diagnosis not present

## 2021-06-27 DIAGNOSIS — I4892 Unspecified atrial flutter: Secondary | ICD-10-CM | POA: Diagnosis not present

## 2021-06-27 DIAGNOSIS — G319 Degenerative disease of nervous system, unspecified: Secondary | ICD-10-CM | POA: Diagnosis not present

## 2021-06-28 DIAGNOSIS — J90 Pleural effusion, not elsewhere classified: Secondary | ICD-10-CM | POA: Diagnosis not present

## 2021-06-28 DIAGNOSIS — L03116 Cellulitis of left lower limb: Secondary | ICD-10-CM | POA: Diagnosis not present

## 2021-06-28 DIAGNOSIS — G9341 Metabolic encephalopathy: Secondary | ICD-10-CM | POA: Diagnosis not present

## 2021-06-28 DIAGNOSIS — R531 Weakness: Secondary | ICD-10-CM | POA: Diagnosis not present

## 2021-06-28 DIAGNOSIS — J9611 Chronic respiratory failure with hypoxia: Secondary | ICD-10-CM | POA: Diagnosis not present

## 2021-06-28 DIAGNOSIS — E032 Hypothyroidism due to medicaments and other exogenous substances: Secondary | ICD-10-CM | POA: Diagnosis not present

## 2021-06-28 DIAGNOSIS — S91302D Unspecified open wound, left foot, subsequent encounter: Secondary | ICD-10-CM | POA: Diagnosis not present

## 2021-06-28 DIAGNOSIS — J449 Chronic obstructive pulmonary disease, unspecified: Secondary | ICD-10-CM | POA: Diagnosis not present

## 2021-06-28 DIAGNOSIS — E1165 Type 2 diabetes mellitus with hyperglycemia: Secondary | ICD-10-CM | POA: Diagnosis not present

## 2021-06-28 DIAGNOSIS — I5023 Acute on chronic systolic (congestive) heart failure: Secondary | ICD-10-CM | POA: Diagnosis not present

## 2021-06-28 DIAGNOSIS — Z9981 Dependence on supplemental oxygen: Secondary | ICD-10-CM | POA: Diagnosis not present

## 2021-06-28 DIAGNOSIS — T462X5A Adverse effect of other antidysrhythmic drugs, initial encounter: Secondary | ICD-10-CM | POA: Diagnosis not present

## 2021-06-30 DIAGNOSIS — S91301A Unspecified open wound, right foot, initial encounter: Secondary | ICD-10-CM | POA: Diagnosis not present

## 2021-06-30 DIAGNOSIS — L89302 Pressure ulcer of unspecified buttock, stage 2: Secondary | ICD-10-CM | POA: Diagnosis not present

## 2021-06-30 DIAGNOSIS — L89629 Pressure ulcer of left heel, unspecified stage: Secondary | ICD-10-CM | POA: Diagnosis not present

## 2021-07-01 DIAGNOSIS — I4891 Unspecified atrial fibrillation: Secondary | ICD-10-CM | POA: Diagnosis not present

## 2021-07-01 DIAGNOSIS — I081 Rheumatic disorders of both mitral and tricuspid valves: Secondary | ICD-10-CM | POA: Diagnosis not present

## 2021-07-01 DIAGNOSIS — I272 Pulmonary hypertension, unspecified: Secondary | ICD-10-CM | POA: Diagnosis not present

## 2021-07-01 DIAGNOSIS — L89629 Pressure ulcer of left heel, unspecified stage: Secondary | ICD-10-CM | POA: Diagnosis not present

## 2021-07-02 ENCOUNTER — Telehealth: Payer: Self-pay | Admitting: Cardiology

## 2021-07-02 DIAGNOSIS — I517 Cardiomegaly: Secondary | ICD-10-CM | POA: Diagnosis not present

## 2021-07-02 DIAGNOSIS — I5022 Chronic systolic (congestive) heart failure: Secondary | ICD-10-CM | POA: Diagnosis not present

## 2021-07-02 DIAGNOSIS — J449 Chronic obstructive pulmonary disease, unspecified: Secondary | ICD-10-CM | POA: Diagnosis not present

## 2021-07-02 NOTE — Telephone Encounter (Signed)
I spoke to Dr Brigitte Pulse concerning possible transfer of this patient from [facility] to our [place] facility. Informed him that he needed to speak to DOD covering the hospital today- Dr Sallyanne Kuster.  Abbigail Anstey [place] MD, [facility]

## 2021-07-03 DIAGNOSIS — L89309 Pressure ulcer of unspecified buttock, unspecified stage: Secondary | ICD-10-CM | POA: Diagnosis not present

## 2021-07-03 DIAGNOSIS — I509 Heart failure, unspecified: Secondary | ICD-10-CM | POA: Diagnosis not present

## 2021-07-03 DIAGNOSIS — L89629 Pressure ulcer of left heel, unspecified stage: Secondary | ICD-10-CM | POA: Diagnosis not present

## 2021-07-04 DIAGNOSIS — J449 Chronic obstructive pulmonary disease, unspecified: Secondary | ICD-10-CM | POA: Diagnosis not present

## 2021-07-04 DIAGNOSIS — Z930 Tracheostomy status: Secondary | ICD-10-CM | POA: Diagnosis not present

## 2021-07-05 DIAGNOSIS — K8020 Calculus of gallbladder without cholecystitis without obstruction: Secondary | ICD-10-CM | POA: Diagnosis not present

## 2021-07-05 DIAGNOSIS — R188 Other ascites: Secondary | ICD-10-CM | POA: Diagnosis not present

## 2021-07-06 DIAGNOSIS — I4811 Longstanding persistent atrial fibrillation: Secondary | ICD-10-CM | POA: Diagnosis not present

## 2021-07-06 DIAGNOSIS — I493 Ventricular premature depolarization: Secondary | ICD-10-CM | POA: Diagnosis not present

## 2021-07-06 DIAGNOSIS — L03116 Cellulitis of left lower limb: Secondary | ICD-10-CM | POA: Diagnosis not present

## 2021-07-06 DIAGNOSIS — N184 Chronic kidney disease, stage 4 (severe): Secondary | ICD-10-CM | POA: Diagnosis not present

## 2021-08-06 DEATH — deceased

## 2024-04-13 NOTE — Progress Notes (Signed)
 SABRA
